# Patient Record
Sex: Female | Born: 1965 | Race: Black or African American | Hispanic: No | Marital: Single | Smoking: Never smoker
Health system: Southern US, Community
[De-identification: ages and names within clinical notes are randomized; demographics above are authoritative.]

## PROBLEM LIST (undated history)

## (undated) DIAGNOSIS — Z800 Family history of malignant neoplasm of digestive organs: Secondary | ICD-10-CM

## (undated) DIAGNOSIS — D649 Anemia, unspecified: Secondary | ICD-10-CM

## (undated) DIAGNOSIS — D259 Leiomyoma of uterus, unspecified: Secondary | ICD-10-CM

## (undated) DIAGNOSIS — E119 Type 2 diabetes mellitus without complications: Secondary | ICD-10-CM

## (undated) DIAGNOSIS — D3A8 Other benign neuroendocrine tumors: Secondary | ICD-10-CM

## (undated) DIAGNOSIS — B373 Candidiasis of vulva and vagina: Secondary | ICD-10-CM

## (undated) DIAGNOSIS — N926 Irregular menstruation, unspecified: Secondary | ICD-10-CM

## (undated) DIAGNOSIS — N63 Unspecified lump in unspecified breast: Secondary | ICD-10-CM

## (undated) DIAGNOSIS — C801 Malignant (primary) neoplasm, unspecified: Secondary | ICD-10-CM

## (undated) DIAGNOSIS — I10 Essential (primary) hypertension: Secondary | ICD-10-CM

## (undated) DIAGNOSIS — R112 Nausea with vomiting, unspecified: Secondary | ICD-10-CM

## (undated) DIAGNOSIS — R896 Abnormal cytological findings in specimens from other organs, systems and tissues: Secondary | ICD-10-CM

## (undated) DIAGNOSIS — IMO0002 Reserved for concepts with insufficient information to code with codable children: Secondary | ICD-10-CM

## (undated) DIAGNOSIS — Z9889 Other specified postprocedural states: Secondary | ICD-10-CM

## (undated) DIAGNOSIS — E785 Hyperlipidemia, unspecified: Secondary | ICD-10-CM

## (undated) DIAGNOSIS — R911 Solitary pulmonary nodule: Secondary | ICD-10-CM

## (undated) HISTORY — DX: Unspecified lump in unspecified breast: N63.0

## (undated) HISTORY — DX: Abnormal cytological findings in specimens from other organs, systems and tissues: R89.6

## (undated) HISTORY — DX: Irregular menstruation, unspecified: N92.6

## (undated) HISTORY — PX: NO PAST SURGERIES: SHX2092

## (undated) HISTORY — DX: Family history of malignant neoplasm of digestive organs: Z80.0

## (undated) HISTORY — DX: Candidiasis of vulva and vagina: B37.3

## (undated) HISTORY — DX: Reserved for concepts with insufficient information to code with codable children: IMO0002

## (undated) HISTORY — DX: Leiomyoma of uterus, unspecified: D25.9

---
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
## 1996-03-15 DIAGNOSIS — IMO0002 Reserved for concepts with insufficient information to code with codable children: Secondary | ICD-10-CM

## 1996-03-15 HISTORY — DX: Reserved for concepts with insufficient information to code with codable children: IMO0002

## 1998-11-09 ENCOUNTER — Other Ambulatory Visit: Admission: RE | Admit: 1998-11-09 | Discharge: 1998-11-09 | Payer: Self-pay | Admitting: *Deleted

## 1999-08-06 ENCOUNTER — Other Ambulatory Visit: Admission: RE | Admit: 1999-08-06 | Discharge: 1999-08-06 | Payer: Self-pay | Admitting: Obstetrics & Gynecology

## 1999-08-06 DIAGNOSIS — IMO0001 Reserved for inherently not codable concepts without codable children: Secondary | ICD-10-CM

## 1999-08-06 HISTORY — DX: Reserved for inherently not codable concepts without codable children: IMO0001

## 2000-04-03 ENCOUNTER — Other Ambulatory Visit: Admission: RE | Admit: 2000-04-03 | Discharge: 2000-04-03 | Payer: Self-pay | Admitting: Obstetrics & Gynecology

## 2000-06-10 DIAGNOSIS — N63 Unspecified lump in unspecified breast: Secondary | ICD-10-CM

## 2000-06-10 HISTORY — DX: Unspecified lump in unspecified breast: N63.0

## 2001-02-20 ENCOUNTER — Other Ambulatory Visit: Admission: RE | Admit: 2001-02-20 | Discharge: 2001-02-20 | Payer: Self-pay | Admitting: Obstetrics and Gynecology

## 2002-02-19 ENCOUNTER — Other Ambulatory Visit: Admission: RE | Admit: 2002-02-19 | Discharge: 2002-02-19 | Payer: Self-pay | Admitting: Obstetrics and Gynecology

## 2003-03-01 ENCOUNTER — Other Ambulatory Visit: Admission: RE | Admit: 2003-03-01 | Discharge: 2003-03-01 | Payer: Self-pay | Admitting: Obstetrics and Gynecology

## 2004-06-10 DIAGNOSIS — B3731 Acute candidiasis of vulva and vagina: Secondary | ICD-10-CM

## 2004-06-10 DIAGNOSIS — B373 Candidiasis of vulva and vagina: Secondary | ICD-10-CM

## 2004-06-10 HISTORY — DX: Acute candidiasis of vulva and vagina: B37.31

## 2004-06-10 HISTORY — DX: Candidiasis of vulva and vagina: B37.3

## 2004-07-03 ENCOUNTER — Other Ambulatory Visit: Admission: RE | Admit: 2004-07-03 | Discharge: 2004-07-03 | Payer: Self-pay | Admitting: Obstetrics and Gynecology

## 2005-07-12 ENCOUNTER — Other Ambulatory Visit: Admission: RE | Admit: 2005-07-12 | Discharge: 2005-07-12 | Payer: Self-pay | Admitting: Obstetrics and Gynecology

## 2008-06-10 DIAGNOSIS — D259 Leiomyoma of uterus, unspecified: Secondary | ICD-10-CM

## 2008-06-10 HISTORY — DX: Leiomyoma of uterus, unspecified: D25.9

## 2009-06-10 DIAGNOSIS — N926 Irregular menstruation, unspecified: Secondary | ICD-10-CM

## 2009-06-10 HISTORY — DX: Irregular menstruation, unspecified: N92.6

## 2012-01-06 ENCOUNTER — Ambulatory Visit: Payer: Self-pay | Admitting: Obstetrics and Gynecology

## 2012-02-06 ENCOUNTER — Ambulatory Visit (INDEPENDENT_AMBULATORY_CARE_PROVIDER_SITE_OTHER): Payer: BC Managed Care – PPO | Admitting: Obstetrics and Gynecology

## 2012-02-06 ENCOUNTER — Encounter: Payer: Self-pay | Admitting: Obstetrics and Gynecology

## 2012-02-06 VITALS — BP 120/82 | Ht 69.0 in | Wt 248.0 lb

## 2012-02-06 DIAGNOSIS — Z124 Encounter for screening for malignant neoplasm of cervix: Secondary | ICD-10-CM

## 2012-02-06 NOTE — Patient Instructions (Signed)
Perimenopause  Perimenopause is the time when your body begins to move into the menopause (no menstrual period for 12 straight months). It is a natural process. Perimenopause can begin 2 to 8 years before the menopause and usually lasts for one year after the menopause. During this time, your ovaries may or may not produce an egg. The ovaries vary in their production of estrogen and progesterone hormones each month. This can cause irregular menstrual periods, difficulty in getting pregnant, vaginal bleeding between periods and uncomfortable symptoms.  CAUSES   Irregular production of the ovarian hormones, estrogen and progesterone, and not ovulating every month.   Other causes include:   Tumor of the pituitary gland in the brain.   Medical disease that affects the ovaries.   Radiation treatment.   Chemotherapy.   Unknown causes.   Heavy smoking and excessive alcohol intake can bring on perimenopause sooner.  SYMPTOMS     Hot flashes.   Night sweats.   Irregular menstrual periods.   Decrease sex drive.   Vaginal dryness.   Headaches.   Mood swings.   Depression.   Memory problems.   Irritability.   Tiredness.   Weight gain.   Trouble getting pregnant.   The beginning of losing bone cells (osteoporosis).   The beginning of hardening of the arteries (atherosclerosis).  DIAGNOSIS    Your caregiver will make a diagnosis by analyzing your age, menstrual history and your symptoms. They will do a physical exam noting any changes in your body, especially your female organs. Female hormone tests may or may not be helpful depending on the amount and when you produce the female hormones. However, other hormone tests may be helpful (ex. thyroid hormone) to rule out other problems.  TREATMENT    The decision to treat during the perimenopause should be made by you and your caregiver depending on how the symptoms are affecting you and your life style. There are various treatments available such as:    Treating individual symptoms with a specific medication for that symptom (ex. tranquilizer for depression).   Herbal medications that can help specific symptoms.   Counseling.   Group therapy.   No treatment.  HOME CARE INSTRUCTIONS     Before seeing your caregiver, make a list of your menstrual periods (when the occur, how heavy they are, how long between periods and how long they last), your symptoms and when they started.   Take the medication as recommended by your caregiver.   Sleep and rest.   Exercise.   Eat a diet that contains calcium (good for your bones) and soy (acts like estrogen hormone).   Do not smoke.   Avoid alcoholic beverages.   Taking vitamin E may help in certain cases.   Take calcium and vitamin D supplements to help prevent bone loss.   Group therapy is sometimes helpful.   Acupuncture may help in some cases.  SEEK MEDICAL CARE IF:     You have any of the above and want to know if it is perimenopause.   You want advice and treatment for any of your symptoms mentioned above.   You need a referral to a specialist (gynecologist, psychiatrist or psychologist).  SEEK IMMEDIATE MEDICAL CARE IF:     You have vaginal bleeding.   Your period lasts longer than 8 days.   You periods are recurring sooner than 21 days.   You have bleeding after intercourse.   You have severe depression.   You have pain   when you urinate.   You have severe headaches.   You develop vision problems.  Document Released: 07/04/2004 Document Revised: 05/16/2011 Document Reviewed: 03/24/2008  [employer] Patient Information 2012 [employer]

## 2012-02-06 NOTE — Progress Notes (Signed)
Last Pap: 7/12 WNL: Yes Regular Periods:no Contraception: n/a  Monthly Breast exam:no Tetanus<23yrs:yes Nl.Bladder Function:yes Daily BMs:yes Healthy Diet:yes Calcium:no Mammogram:yes Date of Mammogram: 2012 wnl per pt Exercise:yes Have often Exercise: 6 days Seatbelt: yes Abuse at home: no Stressful work:no Sigmoid-colonoscopy: n/a Bone Density: No PCP: Dr. Allyne Gee Change in PMH: no change Change in FMH:no change BP 120/82  Ht 5\' 9"  (1.753 m)  Wt 248 lb (112.492 kg)  BMI 36.62 kg/m2  LMP 01/20/2012 Pt without complaints Physical Examination: General appearance - alert, well appearing, and in no distress Mental status - normal mood, behavior, speech, dress, motor activity, and thought processes Neck - supple, no significant adenopathy, thyroid exam: thyroid is normal in size without nodules or tenderness Chest - clear to auscultation, no wheezes, rales or rhonchi, symmetric air entry Heart - normal rate and regular rhythm Abdomen - soft, nontender, nondistended, no masses or organomegaly Breasts - breasts appear normal, no suspicious masses, no skin or nipple changes or axillary nodes Pelvic - normal external genitalia, vulva, vagina, cervix, uterus and adnexa Rectal - normal rectal, no masses, rectal exam not indicated Back exam - full range of motion, no tenderness, palpable spasm or pain on motion Neurological - alert, oriented, normal speech, no focal findings or movement disorder noted Musculoskeletal - no joint tenderness, deformity or swelling Extremities - no edema, redness or tenderness in the calves or thighs Skin - normal coloration and turgor, no rashes, no suspicious skin lesions noted Routine exam Pap sent yes Mammogram due yes scheduled by the pt pt declines contraception RT 1 yr

## 2012-02-07 LAB — PAP IG W/ RFLX HPV ASCU

## 2012-12-10 ENCOUNTER — Other Ambulatory Visit: Payer: Self-pay | Admitting: Endocrinology

## 2012-12-10 DIAGNOSIS — E7800 Pure hypercholesterolemia, unspecified: Secondary | ICD-10-CM

## 2012-12-15 ENCOUNTER — Other Ambulatory Visit (INDEPENDENT_AMBULATORY_CARE_PROVIDER_SITE_OTHER): Payer: BC Managed Care – PPO

## 2012-12-15 DIAGNOSIS — E7800 Pure hypercholesterolemia, unspecified: Secondary | ICD-10-CM

## 2012-12-15 LAB — URINALYSIS
Leukocytes, UA: NEGATIVE
Nitrite: NEGATIVE
Specific Gravity, Urine: <=1.005 (ref 1.000–1.030)
Total Protein, Urine: NEGATIVE
pH: 6.5 (ref 5.0–8.0)

## 2012-12-15 LAB — COMPREHENSIVE METABOLIC PANEL
Alkaline Phosphatase: 45 U/L (ref 39–117)
BUN: 13 mg/dL (ref 6–23)
Creatinine, Ser: 0.8 mg/dL (ref 0.4–1.2)
GFR: 98.96 mL/min (ref >60.00)
Glucose, Bld: 92 mg/dL (ref 70–99)
Sodium: 138 mEq/L (ref 135–145)
Total Bilirubin: 0.5 mg/dL (ref 0.3–1.2)
Total Protein: 8 g/dL (ref 6.0–8.3)

## 2012-12-15 LAB — MICROALBUMIN / CREATININE URINE RATIO
Creatinine,U: 26.4 mg/dL
Microalb Creat Ratio: 0.8 mg/g (ref 0.0–30.0)
Microalb, Ur: 0.2 mg/dL (ref 0.0–1.9)

## 2012-12-17 ENCOUNTER — Ambulatory Visit (INDEPENDENT_AMBULATORY_CARE_PROVIDER_SITE_OTHER): Payer: BC Managed Care – PPO | Admitting: Endocrinology

## 2012-12-17 ENCOUNTER — Encounter: Payer: Self-pay | Admitting: Endocrinology

## 2012-12-17 VITALS — BP 122/80 | HR 69 | Ht 69.0 in | Wt 248.2 lb

## 2012-12-17 DIAGNOSIS — E1165 Type 2 diabetes mellitus with hyperglycemia: Secondary | ICD-10-CM | POA: Insufficient documentation

## 2012-12-17 DIAGNOSIS — IMO0001 Reserved for inherently not codable concepts without codable children: Secondary | ICD-10-CM

## 2012-12-17 DIAGNOSIS — E7800 Pure hypercholesterolemia, unspecified: Secondary | ICD-10-CM

## 2012-12-17 DIAGNOSIS — I10 Essential (primary) hypertension: Secondary | ICD-10-CM

## 2012-12-17 NOTE — Progress Notes (Signed)
Patient ID: Alyssa Ross, female   DOB: 1965-11-19, 47 y.o.   MRN: [id number] Reason for Appointment: Diabetes follow-up   History of Present Illness   Diagnosis: Type 2 DIABETES MELITUS,   Oral hypoglycemic drugs: Amaryl and metformin        Side effects from medications: None Proper timing of medications in relation to meals: Yes.         Monitors blood glucose: Once a day.    Glucometer: One Touch.          Blood Glucose readings: Glucometer download reviewed: readings before breakfast: Median 147, range 104-182. Nonfasting 89-213 with only sporadic readings during the day and only 1 reading in the afternoon and one in the evening. Overall a median 147   Hypoglycemia frequency: Never.          Meals: 3 meals per day.       DIET: She is usually able to control portions usually but has been unable to control higher fat foods and sometimes carbohydrates also  Physical activity: exercise: Going up to 5 days a week for 45 minutes            Her blood sugars are still not optimally controlled with A1c over 7%. She thinks that most of her high sugars are related to dietary indiscretions and she cannot be consistent with diet despite taking Victoza. She has been doing Victoza for sometime. Now she is trying to use Amaryl based on what she is eating and will take 2 mg eating larger meals. Last night with eating a light meal she had a blood sugar of 89 but felt hypoglycemic. She has difficulty losing weight despite exercising  HYPERTENSION:  Has been present for several years.  The blood pressure control is excellent  HYPERLIPIDEMIA:         The lipid abnormality consists of elevated LDL treated with Crestor .    Appointment on 12/15/2012  Component Date Value Range Status  . Hemoglobin A1C 12/15/2012 7.4* 4.6 - 6.5 % Final   Glycemic Control Guidelines for People with Diabetes:Non Diabetic:  <6%Goal of Therapy: <7%Additional Action Suggested:  >8%   . Sodium 12/15/2012 138  135 - 145 mEq/L Final   . Potassium 12/15/2012 3.6  3.5 - 5.1 mEq/L Final  . Chloride 12/15/2012 100  96 - 112 mEq/L Final  . CO2 12/15/2012 29  19 - 32 mEq/L Final  . Glucose, Bld 12/15/2012 92  70 - 99 mg/dL Final  . BUN 40/98/1191 13  6 - 23 mg/dL Final  . Creatinine, Ser 12/15/2012 0.8  0.4 - 1.2 mg/dL Final  . Total Bilirubin 12/15/2012 0.5  0.3 - 1.2 mg/dL Final  . Alkaline Phosphatase 12/15/2012 45  39 - 117 U/L Final  . AST 12/15/2012 14  0 - 37 U/L Final  . ALT 12/15/2012 14  0 - 35 U/L Final  . Total Protein 12/15/2012 8.0  6.0 - 8.3 g/dL Final  . Albumin 47/82/9562 4.2  3.5 - 5.2 g/dL Final  . Calcium 13/01/6577 9.9  8.4 - 10.5 mg/dL Final  . GFR 46/96/2952 98.96  >60.00 mL/min Final  . Color, Urine 12/15/2012 LT. YELLOW  Yellow;Lt. Yellow Final  . APPearance 12/15/2012 CLEAR  Clear Final  . Specific Gravity, Urine 12/15/2012 <=1.005  1.000 - 1.030 Final  . pH 12/15/2012 6.5  5.0 - 8.0 Final  . Total Protein, Urine 12/15/2012 NEGATIVE  Negative Final  . Urine Glucose 12/15/2012 NEGATIVE  Negative Final  . Ketones,  ur 12/15/2012 NEGATIVE  Negative Final  . Bilirubin Urine 12/15/2012 NEGATIVE  Negative Final  . Hgb urine dipstick 12/15/2012 NEGATIVE  Negative Final  . Urobilinogen, UA 12/15/2012 0.2  0.0 - 1.0 Final  . Leukocytes, UA 12/15/2012 NEGATIVE  Negative Final  . Nitrite 12/15/2012 NEGATIVE  Negative Final  . Microalb, Ur 12/15/2012 0.2  0.0 - 1.9 mg/dL Final  . Creatinine,U 60/45/4098 26.4   Final  . Microalb Creat Ratio 12/15/2012 0.8  0.0 - 30.0 mg/g Final      Medication List       This list is accurate as of: 12/17/12  2:52 PM.  Always use your most recent med list.               AMARYL 1 MG tablet  Generic drug:  glimepiride  Take 1 mg by mouth daily before breakfast.     B-D ULTRAFINE III SHORT PEN 31G X 8 MM Misc  Generic drug:  Insulin Pen Needle     lisinopril-hydrochlorothiazide 10-12.5 MG per tablet  Commonly known as:  PRINZIDE,ZESTORETIC  Take 1 tablet by  mouth daily.     metFORMIN 750 MG 24 hr tablet  Commonly known as:  GLUCOPHAGE-XR  Take 750 mg by mouth daily with breakfast.     rosuvastatin 10 MG tablet  Commonly known as:  CRESTOR  Take 10 mg by mouth daily.     VICTOZA 18 MG/3ML Soln injection  Generic drug:  Liraglutide  Inject into the skin.        Allergies: No Known Allergies  Past Medical History  Diagnosis Date  . 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 ASCUS (atypical squamous cells of undetermined significance) on Pap smear 08/06/1999  . Breast mass in female 2002    Left  . Yeast vaginitis 2006  . Fibroid uterus 2010  . Irregular bleeding 2011    No past surgical history on file.  Family History  Problem Relation Age of Onset  . Diabetes Mother   . Diabetes Father   . Mental retardation Sister     Social History:  reports that she has never smoked. She has never used smokeless tobacco. She reports that  drinks alcohol. She reports that she does not use illicit drugs.    Examination:   BP 122/80  Pulse 69  Ht 5\' 9"  (1.753 m)  Wt 248 lb 3.2 oz (112.583 kg)  BMI 36.64 kg/m2  SpO2 96%  LMP 09/17/2012  Body mass index is 36.64 kg/(m^2).   No edema present  Assesment/plan:   1. Diabetes type 2, uncontrolled - 250.02  The patient's diabetes control appears to be limited by her dietary habits. She has excellent control when she follows her diet but blood sugars are higher especially overnight when she is not watching the types of foods she needs to eat. She thinks her portion control is fairly good but will probably too many carbohydrates and also sometimes have regular soft drinks. She is also taking her Amaryl as needed for her food intake. She wants to try a dietary program in [place] before seeing the nutrition specialists here and she will call. However she is still refusing to consider Invokana which may be helpful especially with her difficulty with weight loss.  She does exercise and can continue to be regular with this. Meanwhile can continue taking variable doses of Amaryl based on her intake and also Victoza.  2. Hypertension: Well controlled    Alyannah Sanks 12/17/2012, 2:52  PM

## 2012-12-17 NOTE — Patient Instructions (Signed)
Check more sugars after supper  Stop regular sodas and cut down carbs

## 2012-12-22 DIAGNOSIS — E7800 Pure hypercholesterolemia, unspecified: Secondary | ICD-10-CM | POA: Insufficient documentation

## 2012-12-22 DIAGNOSIS — I10 Essential (primary) hypertension: Secondary | ICD-10-CM | POA: Insufficient documentation

## 2013-02-02 ENCOUNTER — Other Ambulatory Visit: Payer: Self-pay | Admitting: Endocrinology

## 2013-03-12 ENCOUNTER — Other Ambulatory Visit: Payer: BC Managed Care – PPO

## 2013-03-12 ENCOUNTER — Other Ambulatory Visit: Payer: Self-pay | Admitting: Endocrinology

## 2013-03-17 ENCOUNTER — Other Ambulatory Visit: Payer: BC Managed Care – PPO

## 2013-03-19 ENCOUNTER — Ambulatory Visit: Payer: BC Managed Care – PPO | Admitting: Endocrinology

## 2013-03-29 ENCOUNTER — Other Ambulatory Visit: Payer: BC Managed Care – PPO

## 2013-03-29 DIAGNOSIS — E7800 Pure hypercholesterolemia, unspecified: Secondary | ICD-10-CM

## 2013-03-29 DIAGNOSIS — IMO0001 Reserved for inherently not codable concepts without codable children: Secondary | ICD-10-CM

## 2013-03-29 LAB — HEMOGLOBIN A1C: Hgb A1c MFr Bld: 7.8 % — ABNORMAL HIGH (ref 4.6–6.5)

## 2013-03-29 LAB — COMPREHENSIVE METABOLIC PANEL WITH GFR
ALT: 20 U/L (ref 0–35)
AST: 19 U/L (ref 0–37)
Albumin: 3.9 g/dL (ref 3.5–5.2)
Alkaline Phosphatase: 49 U/L (ref 39–117)
BUN: 14 mg/dL (ref 6–23)
CO2: 30 meq/L (ref 19–32)
Calcium: 9.7 mg/dL (ref 8.4–10.5)
Chloride: 102 meq/L (ref 96–112)
Creatinine, Ser: 0.9 mg/dL (ref 0.4–1.2)
GFR: 92.16 mL/min
Glucose, Bld: 150 mg/dL — ABNORMAL HIGH (ref 70–99)
Potassium: 3.8 meq/L (ref 3.5–5.1)
Sodium: 140 meq/L (ref 135–145)
Total Bilirubin: 0.6 mg/dL (ref 0.3–1.2)
Total Protein: 7.8 g/dL (ref 6.0–8.3)

## 2013-03-29 LAB — LIPID PANEL
Cholesterol: 167 mg/dL (ref 0–200)
HDL: 54.7 mg/dL
LDL Cholesterol: 88 mg/dL (ref 0–99)
Total CHOL/HDL Ratio: 3
Triglycerides: 121 mg/dL (ref 0.0–149.0)
VLDL: 24.2 mg/dL (ref 0.0–40.0)

## 2013-03-29 LAB — MICROALBUMIN / CREATININE URINE RATIO
Creatinine,U: 35 mg/dL
Microalb, Ur: 0.5 mg/dL (ref 0.0–1.9)

## 2013-04-02 ENCOUNTER — Ambulatory Visit (INDEPENDENT_AMBULATORY_CARE_PROVIDER_SITE_OTHER): Payer: BC Managed Care – PPO | Admitting: Endocrinology

## 2013-04-02 ENCOUNTER — Encounter: Payer: Self-pay | Admitting: Endocrinology

## 2013-04-02 VITALS — BP 120/80 | HR 71 | Temp 98.0°F | Ht 69.0 in | Wt 244.4 lb

## 2013-04-02 DIAGNOSIS — E7800 Pure hypercholesterolemia, unspecified: Secondary | ICD-10-CM

## 2013-04-02 DIAGNOSIS — IMO0001 Reserved for inherently not codable concepts without codable children: Secondary | ICD-10-CM

## 2013-04-02 DIAGNOSIS — I10 Essential (primary) hypertension: Secondary | ICD-10-CM

## 2013-04-02 DIAGNOSIS — Z23 Encounter for immunization: Secondary | ICD-10-CM

## 2013-04-02 NOTE — Patient Instructions (Signed)
May reduce am Glimeperide to 1/2 with diet

## 2013-04-02 NOTE — Progress Notes (Signed)
Patient ID: Alyssa Ross, female   DOB: Feb 07, 1966, 47 y.o.   MRN: [id number]  Reason for Appointment: Diabetes follow-up   History of Present Illness   Diagnosis: Type 2 DIABETES MELITUS   Her blood sugars are still not optimally controlled with A1c slightly higher. She thinks that most of her high sugars are related to inconsistent adherence to her diet. She has difficulty with choices of foods rather than portions. She has been doing Victoza for sometime. Also she is taking are consistent dose of Amaryl now She tends to have relatively high readings in the mornings and not as high after supper but did not bring the monitor for review She has difficulty losing weight despite exercising  Oral hypoglycemic drugs: Amaryl and metformin        Side effects from medications: None Proper timing of medications in relation to meals: Yes.         Monitors blood glucose: ireg most in ams.    Glucometer: One Touch.          Blood Glucose readings by recall: readings before breakfast: AM 160-170; HS 120-130   Hypoglycemia frequency: Never.          Meals: 3 meals per day.       DIET: She is usually able to control portions usually but has been unable to control higher fat foods and sometimes carbohydrates also  Physical activity: exercise: Going up to 5 days a week for 45 minutes             HYPERTENSION:  Has been present for several years.  The blood pressure control is good with her Zestoretic  HYPERLIPIDEMIA:         The lipid abnormality consists of elevated LDL treated with Crestor. LDL 88.  LABS:  Lab Results  Component Value Date   HGBA1C 7.8* 03/29/2013   HGBA1C 7.4* 12/15/2012   Lab Results  Component Value Date   MICROALBUR 0.5 03/29/2013   LDLCALC 88 03/29/2013   CREATININE 0.9 03/29/2013    Appointment on 03/29/2013  Component Date Value Range Status  . Microalb, Ur 03/29/2013 0.5  0.0 - 1.9 mg/dL Final  . Creatinine,U 08/65/7846 35.0   Final  . Microalb Creat Ratio  03/29/2013 1.4  0.0 - 30.0 mg/g Final  . Sodium 03/29/2013 140  135 - 145 mEq/L Final  . Potassium 03/29/2013 3.8  3.5 - 5.1 mEq/L Final  . Chloride 03/29/2013 102  96 - 112 mEq/L Final  . CO2 03/29/2013 30  19 - 32 mEq/L Final  . Glucose, Bld 03/29/2013 150* 70 - 99 mg/dL Final  . BUN 96/29/5284 14  6 - 23 mg/dL Final  . Creatinine, Ser 03/29/2013 0.9  0.4 - 1.2 mg/dL Final  . Total Bilirubin 03/29/2013 0.6  0.3 - 1.2 mg/dL Final  . Alkaline Phosphatase 03/29/2013 49  39 - 117 U/L Final  . AST 03/29/2013 19  0 - 37 U/L Final  . ALT 03/29/2013 20  0 - 35 U/L Final  . Total Protein 03/29/2013 7.8  6.0 - 8.3 g/dL Final  . Albumin 13/24/4010 3.9  3.5 - 5.2 g/dL Final  . Calcium 27/25/3664 9.7  8.4 - 10.5 mg/dL Final  . GFR 40/34/7425 92.16  >60.00 mL/min Final  . Cholesterol 03/29/2013 167  0 - 200 mg/dL Final   ATP III Classification       Desirable:  < 200 mg/dL  Borderline High:  200 - 239 mg/dL          High:  > = 161 mg/dL  . Triglycerides 03/29/2013 121.0  0.0 - 149.0 mg/dL Final   Normal:  <096 mg/dLBorderline High:  150 - 199 mg/dL  . HDL 03/29/2013 54.70  >39.00 mg/dL Final  . VLDL 04/54/0981 24.2  0.0 - 40.0 mg/dL Final  . LDL Cholesterol 03/29/2013 88  0 - 99 mg/dL Final  . Total CHOL/HDL Ratio 03/29/2013 3   Final                  Men          Women1/2 Average Risk     3.4          3.3Average Risk          5.0          4.42X Average Risk          9.6          7.13X Average Risk          15.0          11.0                      . Hemoglobin A1C 03/29/2013 7.8* 4.6 - 6.5 % Final   Glycemic Control Guidelines for People with Diabetes:Non Diabetic:  <6%Goal of Therapy: <7%Additional Action Suggested:  >8%       Medication List       This list is accurate as of: 04/02/13  1:52 PM.  Always use your most recent med list.               B-D ULTRAFINE III SHORT PEN 31G X 8 MM Misc  Generic drug:  Insulin Pen Needle     glimepiride 2 MG tablet  Commonly known as:   AMARYL  Take 2 mg by mouth 2 (two) times daily.     lisinopril-hydrochlorothiazide 10-12.5 MG per tablet  Commonly known as:  PRINZIDE,ZESTORETIC  Take 1 tablet by mouth daily.     metFORMIN 750 MG 24 hr tablet  Commonly known as:  GLUCOPHAGE-XR  Take 750 mg by mouth 3 (three) times daily.     rosuvastatin 10 MG tablet  Commonly known as:  CRESTOR  Take 10 mg by mouth daily.     VICTOZA 18 MG/3ML Sopn  Generic drug:  Liraglutide  INJECT 1.8 MG SUBCUTANEOUSLY DAILY     VICTOZA 18 MG/3ML Soln injection  Generic drug:  Liraglutide  Inject into the skin.        Allergies: No Known Allergies  Past Medical History  Diagnosis Date  . 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 ASCUS (atypical squamous cells of undetermined significance) on Pap smear 08/06/1999  . Breast mass in female 2002    Left  . Yeast vaginitis 2006  . Fibroid uterus 2010  . Irregular bleeding 2011    History reviewed. No pertinent past surgical history.  Family History  Problem Relation Age of Onset  . Diabetes Mother   . Diabetes Father   . Mental retardation Sister     Social History:  reports that she has never smoked. She has never used smokeless tobacco. She reports that she drinks alcohol. She reports that she does not use illicit drugs.    Examination:   BP 120/80  Pulse 71  Temp(Src) 98 F (36.7 C) (Oral)  Ht 5\' 9"  (1.753 m)  Wt 244 lb 6.4 oz (110.859 kg)  BMI 36.08 kg/m2  SpO2 97%  Body mass index is 36.08 kg/(m^2).    Assessment/Plan:   1. Diabetes type 2, uncontrolled - 250.02  The patient's diabetes control appears slightly worse with A1c trending higher Appears to have relatively high fasting readings and not reportedly high after supper This may be partly related to higher fat intake She is exercising but has difficulty losing weight Has been reluctant to take Invokana because of potential yeast infections  She is going to be started on a  vigorous nutritional program at [facility] and she thinks she will be able to improve control and lose weight with this. She will be monitored closely on this program Have discussed that she may be able to reduce her Amaryl dose of her blood sugars are trending lower especially with weight loss and reduce carbohydrate intake  2. Hypertension: Well controlled   Kahlan Engebretson 04/02/2013, 1:52 PM

## 2013-05-05 ENCOUNTER — Other Ambulatory Visit: Payer: Self-pay | Admitting: Endocrinology

## 2013-07-06 ENCOUNTER — Other Ambulatory Visit (INDEPENDENT_AMBULATORY_CARE_PROVIDER_SITE_OTHER): Payer: BC Managed Care – PPO

## 2013-07-06 DIAGNOSIS — IMO0001 Reserved for inherently not codable concepts without codable children: Secondary | ICD-10-CM

## 2013-07-06 DIAGNOSIS — E1165 Type 2 diabetes mellitus with hyperglycemia: Principal | ICD-10-CM

## 2013-07-06 LAB — COMPREHENSIVE METABOLIC PANEL
ALBUMIN: 4.1 g/dL (ref 3.5–5.2)
ALT: 28 U/L (ref 0–35)
AST: 18 U/L (ref 0–37)
Alkaline Phosphatase: 51 U/L (ref 39–117)
BUN: 17 mg/dL (ref 6–23)
CALCIUM: 9.9 mg/dL (ref 8.4–10.5)
CO2: 31 mEq/L (ref 19–32)
CREATININE: 1 mg/dL (ref 0.4–1.2)
Chloride: 100 mEq/L (ref 96–112)
GFR: 80.96 mL/min (ref >60.00)
GLUCOSE: 136 mg/dL — AB (ref 70–99)
POTASSIUM: 3.4 meq/L — AB (ref 3.5–5.1)
Sodium: 137 mEq/L (ref 135–145)
Total Bilirubin: 0.7 mg/dL (ref 0.3–1.2)
Total Protein: 8 g/dL (ref 6.0–8.3)

## 2013-07-06 LAB — HEMOGLOBIN A1C: Hgb A1c MFr Bld: 8.5 % — ABNORMAL HIGH (ref 4.6–6.5)

## 2013-07-09 ENCOUNTER — Ambulatory Visit (INDEPENDENT_AMBULATORY_CARE_PROVIDER_SITE_OTHER): Payer: BC Managed Care – PPO | Admitting: Endocrinology

## 2013-07-09 ENCOUNTER — Other Ambulatory Visit: Payer: Self-pay | Admitting: *Deleted

## 2013-07-09 ENCOUNTER — Encounter: Payer: Self-pay | Admitting: Endocrinology

## 2013-07-09 VITALS — BP 132/84 | HR 81 | Temp 98.2°F | Resp 16 | Ht 69.0 in | Wt 240.8 lb

## 2013-07-09 DIAGNOSIS — I10 Essential (primary) hypertension: Secondary | ICD-10-CM

## 2013-07-09 DIAGNOSIS — E1165 Type 2 diabetes mellitus with hyperglycemia: Principal | ICD-10-CM

## 2013-07-09 DIAGNOSIS — IMO0001 Reserved for inherently not codable concepts without codable children: Secondary | ICD-10-CM

## 2013-07-09 MED ORDER — POTASSIUM CHLORIDE CRYS ER 20 MEQ PO TBCR: 20.0000 meq | EXTENDED_RELEASE_TABLET | Freq: Every day | ORAL | Status: DC

## 2013-07-09 MED ORDER — INSULIN DETEMIR 100 UNIT/ML FLEXPEN
15.0000 [IU] | PEN_INJECTOR | Freq: Every day | SUBCUTANEOUS | Status: DC
Start: 2013-07-09 — End: 2013-08-04

## 2013-07-09 NOTE — Progress Notes (Signed)
Patient ID: Alyssa Ross, female   DOB: September 30, 1965, 48 y.o.   MRN: [id number]   Reason for Appointment: Diabetes follow-up   History of Present Illness   Diagnosis: Type 2 DIABETES MELITUS, date of diagnosis 2005   She had previously been on metformin and Amaryl Because of inadequate control she was given Victoza in addition in 2011 With this her blood sugars have been significantly better and her A1c has been as low as 6.2 in 2013 Her blood sugars have been more difficult to control since 2014 an A1c consistently over 7% RECENT history: Her A1c has increased further this time On her last visit she was hoping to get much better controlled by joining a nutritional support program at [facility] She is currently enrolled but is not able to watch her diet consistently. She has difficulty with choices of foods rather than portions. She is on 1.8 mg  of Victoza as well as metformin. Also she is taking maximum dose of Amaryl now Hyperglycemia: This appears to be mostly fasting and has only occasional high readings at night She has been losing weight and is still compliant with her exercising routine  Oral hypoglycemic drugs: Amaryl 4mg  bid and metformin 2250 mg       Side effects from medications: None Proper timing of medications in relation to meals: Yes.         Monitors blood glucose: Once a day   Glucometer: One Touch.          Blood Glucose readings  PREMEAL Breakfast Lunch Dinner Bedtime Overall  Glucose range:  160-240   187-260   92, 162     Mean/median:  196      190    POST-MEAL PC Breakfast PC Lunch PC Dinner  Glucose range:   182, 259   150-215   Mean/median:      Hypoglycemia: None      Meals: 3 meals per day.       DIET: She is usually able to control portions usually but has been unable to control higher fat foods and sometimes carbohydrates also  Physical activity: exercise: Going up to 5 days a week for 45 minutes            Wt Readings from Last 3 Encounters:   07/09/13 240 lb 12.8 oz (109.226 kg)  04/02/13 244 lb 6.4 oz (110.859 kg)  12/17/12 248 lb 3.2 oz (112.583 kg)    HYPERTENSION:  Has been present for several years.  The blood pressure is controlled with  Zestoretic  HYPERLIPIDEMIA:         The lipid abnormality consists of elevated LDL treated with Crestor. Last LDL 88.  LABS:  Lab Results  Component Value Date   HGBA1C 8.5* 07/06/2013   HGBA1C 7.8* 03/29/2013   HGBA1C 7.4* 12/15/2012   Lab Results  Component Value Date   MICROALBUR 0.5 03/29/2013   LDLCALC 88 03/29/2013   CREATININE 1.0 07/06/2013    Appointment on 07/06/2013  Component Date Value Range Status  . Hemoglobin A1C 07/06/2013 8.5* 4.6 - 6.5 % Final   Glycemic Control Guidelines for People with Diabetes:Non Diabetic:  <6%Goal of Therapy: <7%Additional Action Suggested:  >8%   . Sodium 07/06/2013 137  135 - 145 mEq/L Final  . Potassium 07/06/2013 3.4* 3.5 - 5.1 mEq/L Final  . Chloride 07/06/2013 100  96 - 112 mEq/L Final  . CO2 07/06/2013 31  19 - 32 mEq/L Final  . Glucose, Bld 07/06/2013 136* 70 -  99 mg/dL Final  . BUN 07/06/2013 17  6 - 23 mg/dL Final  . Creatinine, Ser 07/06/2013 1.0  0.4 - 1.2 mg/dL Final  . Total Bilirubin 07/06/2013 0.7  0.3 - 1.2 mg/dL Final  . Alkaline Phosphatase 07/06/2013 51  39 - 117 U/L Final  . AST 07/06/2013 18  0 - 37 U/L Final  . ALT 07/06/2013 28  0 - 35 U/L Final  . Total Protein 07/06/2013 8.0  6.0 - 8.3 g/dL Final  . Albumin 07/06/2013 4.1  3.5 - 5.2 g/dL Final  . Calcium 07/06/2013 9.9  8.4 - 10.5 mg/dL Final  . GFR 07/06/2013 80.96  >60.00 mL/min Final      Medication List       This list is accurate as of: 07/09/13  1:46 PM.  Always use your most recent med list.               B-D ULTRAFINE III SHORT PEN 31G X 8 MM Misc  Generic drug:  Insulin Pen Needle  USE WITH VICTOZA PEN AS DIRECTED     glimepiride 2 MG tablet  Commonly known as:  AMARYL  Take 2 mg by mouth 2 (two) times daily.      lisinopril-hydrochlorothiazide 10-12.5 MG per tablet  Commonly known as:  PRINZIDE,ZESTORETIC  Take 1 tablet by mouth daily.     metFORMIN 750 MG 24 hr tablet  Commonly known as:  GLUCOPHAGE-XR  Take 750 mg by mouth 3 (three) times daily.     rosuvastatin 10 MG tablet  Commonly known as:  CRESTOR  Take 10 mg by mouth daily.     VICTOZA 18 MG/3ML Sopn  Generic drug:  Liraglutide  INJECT 1.8 MG SUBCUTANEOUSLY DAILY        Allergies: No Known Allergies  Past Medical History  Diagnosis Date  . 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 ASCUS (atypical squamous cells of undetermined significance) on Pap smear 08/06/1999  . Breast mass in female 2002    Left  . Yeast vaginitis 2006  . Fibroid uterus 2010  . Irregular bleeding 2011    No past surgical history on file.  Family History  Problem Relation Age of Onset  . Diabetes Mother   . Diabetes Father   . Mental retardation Sister     Social History:  reports that she has never smoked. She has never used smokeless tobacco. She reports that she drinks alcohol. She reports that she does not use illicit drugs.    Examination:   BP 132/84  Pulse 81  Temp(Src) 98.2 F (36.8 C)  Resp 16  Ht 5\' 9"  (1.753 m)  Wt 240 lb 12.8 oz (109.226 kg)  BMI 35.54 kg/m2  SpO2 96%  Body mass index is 35.54 kg/(m^2).    Assessment/Plan:   Diabetes type 2, uncontrolled  The patient's diabetes control appears  worse with A1c trending  progressively higher Appears to have relatively high fasting readings and sporadically high during the day although checking mostly in the morning This may be partly related to higher fat intake She is exercising but has difficulty watching her diet consistently Has been reluctant to take Invokana because of potential yeast infections, is having some candidiasis now   Discussed with her that since she has failed 3 drugs including maximum dose metformin, Amaryl and Victoza she is  likely to be insulin deficient and should start at least basal insulin She has many questions about insulin and discussed various aspects  of insulin treatment including time action profile, timing of insulin, dosage adjustment, use of the insulin pen and expected improvement in glucose readings She will start with a low dose of 8 units at bedtime of Levemir with the flex pen and increase the dose every 3 days until fasting readings are below 130. Literature on insulin and Levemir given  Encouraged her to watch her diet better   Counseling time over 50% of today's 25 minute visit   Jonovan Boedecker 07/09/2013, 1:46 PM

## 2013-07-09 NOTE — Patient Instructions (Signed)
Levemir insulin: This insulin provides blood sugar control for up to 24 hours.  Start with 8 units at bedtime daily and increase by 2 units every 3 days until the waking up sugars are under 130. Then continue the same dose. If blood sugar is under 90 for 2 days in a row, reduce the dose by 2 units. Note that this insulin does not control the rise of blood sugar with meals

## 2013-07-27 ENCOUNTER — Other Ambulatory Visit: Payer: Self-pay | Admitting: Endocrinology

## 2013-07-30 ENCOUNTER — Other Ambulatory Visit (INDEPENDENT_AMBULATORY_CARE_PROVIDER_SITE_OTHER): Payer: BC Managed Care – PPO

## 2013-07-30 DIAGNOSIS — E1165 Type 2 diabetes mellitus with hyperglycemia: Principal | ICD-10-CM

## 2013-07-30 DIAGNOSIS — IMO0001 Reserved for inherently not codable concepts without codable children: Secondary | ICD-10-CM

## 2013-07-30 LAB — BASIC METABOLIC PANEL
BUN: 16 mg/dL (ref 6–23)
CHLORIDE: 98 meq/L (ref 96–112)
CO2: 30 mEq/L (ref 19–32)
Calcium: 10.1 mg/dL (ref 8.4–10.5)
Creatinine, Ser: 0.8 mg/dL (ref 0.4–1.2)
GFR: 98.7 mL/min (ref >60.00)
Glucose, Bld: 100 mg/dL — ABNORMAL HIGH (ref 70–99)
POTASSIUM: 3.4 meq/L — AB (ref 3.5–5.1)
SODIUM: 136 meq/L (ref 135–145)

## 2013-07-31 LAB — FRUCTOSAMINE: FRUCTOSAMINE: 287 umol/L — AB (ref <285)

## 2013-08-04 ENCOUNTER — Ambulatory Visit (INDEPENDENT_AMBULATORY_CARE_PROVIDER_SITE_OTHER): Payer: BC Managed Care – PPO | Admitting: Endocrinology

## 2013-08-04 ENCOUNTER — Encounter: Payer: Self-pay | Admitting: Endocrinology

## 2013-08-04 VITALS — BP 122/80 | HR 85 | Temp 97.6°F | Resp 16 | Ht 69.0 in | Wt 240.0 lb

## 2013-08-04 DIAGNOSIS — E7800 Pure hypercholesterolemia, unspecified: Secondary | ICD-10-CM

## 2013-08-04 DIAGNOSIS — E1165 Type 2 diabetes mellitus with hyperglycemia: Principal | ICD-10-CM

## 2013-08-04 DIAGNOSIS — E876 Hypokalemia: Secondary | ICD-10-CM

## 2013-08-04 DIAGNOSIS — IMO0001 Reserved for inherently not codable concepts without codable children: Secondary | ICD-10-CM

## 2013-08-04 DIAGNOSIS — I10 Essential (primary) hypertension: Secondary | ICD-10-CM

## 2013-08-04 NOTE — Patient Instructions (Addendum)
Levemir 18 units daily  Please check blood sugars at least half the time about 2 hours after any meal and as directed on waking up. Please bring blood sugar monitor to each visit  Potassium 2 daily

## 2013-08-04 NOTE — Progress Notes (Signed)
Patient ID: Alyssa Ross, female   DOB: [birth year], 48 y.o.   MRN: [id number]   Reason for Appointment: Diabetes follow-up   History of Present Illness   Diagnosis: Type 2 DIABETES MELITUS, date of diagnosis 2005   She had previously been on metformin and Amaryl Because of inadequate control she was given Victoza in addition in 2011 With this her blood sugars have been significantly better and her A1c has been as low as 6.2 in 2013 Her blood sugars have been more difficult to control since 2014 an A1c consistently over 7%  RECENT history:  She was started on insulin with Levemir 8 units in 06/2013 because of further increase in her A1c to 8.5% She had previously been taking Victoza along with Amaryl and metformin without consistent controlled Also has had difficulty watching her diet especially with portions and snacks like ice cream She is injecting the insulin in her thighs and has done well with gradually increasing the dose, now taking 16 units in the last few days Her weight has been stable Fructosamine is near normal at 287 now Hyperglycemia: This had been mostly in the fasting state but improved with insulin, has hardly checked her sugar after meals as she forgets She has been losing weight and is still compliant with her exercising routine  Oral hypoglycemic drugs: Amaryl 4mg  bid and metformin 2250 mg       Side effects from medications: None Proper timing of medications in relation to meals: Yes.         Monitors blood glucose: Once a day   Glucometer: One Touch.          Blood Glucose readings in the last 2 weeks:  PREMEAL Breakfast Lunch  PMs  Bedtime Overall  Glucose range:  125-169   108   186, 193  ?    Mean/median:        Median glucose for the last month is 175 Hypoglycemia: None      Meals: 3 meals per day.       DIET: She is usually able to control portions usually but has been unable to control higher fat foods and sweets Physical activity: exercise: Going up to 5  days a week for 45 minutes, less recently            Wt Readings from Last 3 Encounters:  08/04/13 240 lb (108.863 kg)  07/09/13 240 lb 12.8 oz (109.226 kg)  04/02/13 244 lb 6.4 oz (110.859 kg)    LABS:  Lab Results  Component Value Date   HGBA1C 8.5* 07/06/2013   HGBA1C 7.8* 03/29/2013   HGBA1C 7.4* 12/15/2012   Lab Results  Component Value Date   MICROALBUR 0.5 03/29/2013   LDLCALC 88 03/29/2013   CREATININE 0.8 07/30/2013    Appointment on 07/30/2013  Component Date Value Ref Range Status  . Sodium 07/30/2013 136  135 - 145 mEq/L Final  . Potassium 07/30/2013 3.4* 3.5 - 5.1 mEq/L Final  . Chloride 07/30/2013 98  96 - 112 mEq/L Final  . CO2 07/30/2013 30  19 - 32 mEq/L Final  . Glucose, Bld 07/30/2013 100* 70 - 99 mg/dL Final  . BUN 07/30/2013 16  6 - 23 mg/dL Final  . Creatinine, Ser 07/30/2013 0.8  0.4 - 1.2 mg/dL Final  . Calcium 07/30/2013 10.1  8.4 - 10.5 mg/dL Final  . GFR 07/30/2013 98.70  >60.00 mL/min Final  . Fructosamine 07/30/2013 287* <285 umol/L Final   Comment:  Variations in levels of serum proteins (albumin and immunoglobulins)                          may affect fructosamine results.                                 Medication List       This list is accurate as of: 08/04/13  1:46 PM.  Always use your most recent med list.               B-D ULTRAFINE III SHORT PEN 31G X 8 MM Misc  Generic drug:  Insulin Pen Needle  USE WITH VICTOZA AS DIRECTED     glimepiride 2 MG tablet  Commonly known as:  AMARYL  Take 2 mg by mouth 2 (two) times daily.     Insulin Detemir 100 UNIT/ML Pen  Commonly known as:  LEVEMIR  Inject 16 Units into the skin daily.     lisinopril-hydrochlorothiazide 10-12.5 MG per tablet  Commonly known as:  PRINZIDE,ZESTORETIC  Take 1 tablet by mouth daily.     metFORMIN 750 MG 24 hr tablet  Commonly known as:  GLUCOPHAGE-XR  Take 750 mg by mouth 3 (three) times daily.     potassium chloride SA 20  MEQ tablet  Commonly known as:  K-DUR,KLOR-CON  Take 1 tablet (20 mEq total) by mouth daily.     rosuvastatin 10 MG tablet  Commonly known as:  CRESTOR  Take 10 mg by mouth daily.     VICTOZA 18 MG/3ML Sopn  Generic drug:  Liraglutide  INJECT 1.8 MG SUBCUTANEOUSLY DAILY        Allergies: No Known Allergies  Past Medical History  Diagnosis Date  . 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 ASCUS (atypical squamous cells of undetermined significance) on Pap smear 08/06/1999  . Breast mass in female 2002    Left  . Yeast vaginitis 2006  . Fibroid uterus 2010  . Irregular bleeding 2011    No past surgical history on file.  Family History  Problem Relation Age of Onset  . Diabetes Mother   . Diabetes Father   . Mental retardation Sister     Social History:  reports that she has never smoked. She has never used smokeless tobacco. She reports that she drinks alcohol. She reports that she does not use illicit drugs.  REVIEW of systems:  Her potassium is still low despite starting a supplement last month.  She is currently being followed by her PCP for hypertension. Blood pressure is well controlled with Zestoretic  Has had hypercholesterolemia, taking Crestor  Lab Results  Component Value Date   CHOL 167 03/29/2013   HDL 54.70 03/29/2013   LDLCALC 88 03/29/2013   TRIG 121.0 03/29/2013   CHOLHDL 3 03/29/2013      Examination:   BP 122/80  Pulse 85  Temp(Src) 97.6 F (36.4 C)  Resp 16  Ht 5\' 9"  (1.753 m)  Wt 240 lb (108.863 kg)  BMI 35.43 kg/m2  SpO2 99%  Body mass index is 35.43 kg/(m^2).    Assessment/Plan:   Diabetes type 2, uncontrolled  The patient's diabetes is improving with starting basal insulin She is now up to 16 units of Levemir but her morning sugars are still somewhat high She is also not checking readings after meals recently Although she has not gained weight  she can do significantly better with watching her diet  and more regular exercise  Discussed fasting blood sugar targets and she can go up to 18 units for now to have fasting readings consistently below 130 May need to consider mealtime insulin also if she has persistently high postprandial readings A1c to be checked in 2 months  Hypokalemia: She will discuss going off HCTZ on her next visit with her PCP, meanwhile needs to take potassium twice a day   Zakhi Dupre 08/04/2013, 1:46 PM

## 2013-08-09 ENCOUNTER — Other Ambulatory Visit: Payer: Self-pay | Admitting: *Deleted

## 2013-08-09 ENCOUNTER — Telehealth: Payer: Self-pay | Admitting: Endocrinology

## 2013-08-09 MED ORDER — POTASSIUM CHLORIDE CRYS ER 20 MEQ PO TBCR: EXTENDED_RELEASE_TABLET | ORAL | Status: DC

## 2013-08-09 MED ORDER — ONETOUCH DELICA LANCETS 33G MISC: Status: DC

## 2013-08-09 MED ORDER — GLUCOSE BLOOD VI STRP: ORAL_STRIP | Status: DC

## 2013-08-09 NOTE — Telephone Encounter (Signed)
Pt called in this afternoon and stated Dr.Kumar changed her potassium RX called in [facility] In addition pt also needs lancets and test strips sent to [facility] One [facility] Ultra [facility]  [facility] [phone]  Thank you :)

## 2013-08-15 ENCOUNTER — Other Ambulatory Visit: Payer: Self-pay | Admitting: Endocrinology

## 2013-09-14 ENCOUNTER — Other Ambulatory Visit: Payer: Self-pay | Admitting: Endocrinology

## 2013-09-30 ENCOUNTER — Other Ambulatory Visit: Payer: Self-pay | Admitting: Endocrinology

## 2013-10-01 ENCOUNTER — Other Ambulatory Visit (INDEPENDENT_AMBULATORY_CARE_PROVIDER_SITE_OTHER): Payer: BC Managed Care – PPO

## 2013-10-01 DIAGNOSIS — E1165 Type 2 diabetes mellitus with hyperglycemia: Principal | ICD-10-CM

## 2013-10-01 DIAGNOSIS — IMO0001 Reserved for inherently not codable concepts without codable children: Secondary | ICD-10-CM

## 2013-10-01 LAB — MICROALBUMIN / CREATININE URINE RATIO
Creatinine,U: 17.2 mg/dL
Microalb Creat Ratio: 5.8 mg/g (ref 0.0–30.0)
Microalb, Ur: 1 mg/dL (ref 0.0–1.9)

## 2013-10-01 LAB — HEMOGLOBIN A1C: Hgb A1c MFr Bld: 7.4 % — ABNORMAL HIGH (ref 4.6–6.5)

## 2013-10-01 LAB — URINALYSIS, ROUTINE W REFLEX MICROSCOPIC
BILIRUBIN URINE: NEGATIVE
Hgb urine dipstick: NEGATIVE
Ketones, ur: NEGATIVE
Leukocytes, UA: NEGATIVE
NITRITE: NEGATIVE
PH: 6 (ref 5.0–8.0)
Specific Gravity, Urine: <=1.005 — AB (ref 1.000–1.030)
TOTAL PROTEIN, URINE-UPE24: NEGATIVE
Urine Glucose: NEGATIVE
Urobilinogen, UA: 0.2 (ref 0.0–1.0)

## 2013-10-04 LAB — COMPREHENSIVE METABOLIC PANEL
ALK PHOS: 47 U/L (ref 39–117)
ALT: 16 U/L (ref 0–35)
AST: 31 U/L (ref 0–37)
Albumin: 4.2 g/dL (ref 3.5–5.2)
BUN: 16 mg/dL (ref 6–23)
CALCIUM: 10 mg/dL (ref 8.4–10.5)
CHLORIDE: 105 meq/L (ref 96–112)
CO2: 26 mEq/L (ref 19–32)
CREATININE: 0.9 mg/dL (ref 0.4–1.2)
GFR: 83.93 mL/min (ref >60.00)
Glucose, Bld: 118 mg/dL — ABNORMAL HIGH (ref 70–99)
Potassium: 4.7 mEq/L (ref 3.5–5.1)
Sodium: 143 mEq/L (ref 135–145)
Total Bilirubin: 0.4 mg/dL (ref 0.3–1.2)
Total Protein: 8 g/dL (ref 6.0–8.3)

## 2013-10-04 LAB — LIPID PANEL
Cholesterol: 159 mg/dL (ref 0–200)
HDL: 47.2 mg/dL (ref >39.00)
LDL Cholesterol: 76 mg/dL (ref 0–99)
TRIGLYCERIDES: 177 mg/dL — AB (ref 0.0–149.0)
Total CHOL/HDL Ratio: 3
VLDL: 35.4 mg/dL (ref 0.0–40.0)

## 2013-10-07 ENCOUNTER — Ambulatory Visit: Payer: BC Managed Care – PPO | Admitting: Endocrinology

## 2013-10-14 ENCOUNTER — Encounter: Payer: Self-pay | Admitting: Endocrinology

## 2013-10-14 ENCOUNTER — Ambulatory Visit (INDEPENDENT_AMBULATORY_CARE_PROVIDER_SITE_OTHER): Payer: BC Managed Care – PPO | Admitting: Endocrinology

## 2013-10-14 VITALS — BP 116/78 | HR 86 | Temp 97.8°F | Resp 16 | Ht 69.0 in | Wt 242.6 lb

## 2013-10-14 DIAGNOSIS — IMO0001 Reserved for inherently not codable concepts without codable children: Secondary | ICD-10-CM

## 2013-10-14 DIAGNOSIS — E1165 Type 2 diabetes mellitus with hyperglycemia: Principal | ICD-10-CM

## 2013-10-14 MED ORDER — GLIMEPIRIDE 4 MG PO TABS: 2.0000 mg | ORAL_TABLET | Freq: Every day | ORAL | Status: DC

## 2013-10-14 NOTE — Patient Instructions (Addendum)
Levemir 22 units daily, target am sugar 110-140  More sugars after dinner  Amaryl at supper

## 2013-10-14 NOTE — Progress Notes (Signed)
Patient ID: Alyssa Ross, female   DOB: 1966/01/16, 48 y.o.   MRN: [id number]   Reason for Appointment: Diabetes follow-up   History of Present Illness   Diagnosis: Type 2 DIABETES MELITUS, date of diagnosis 2005   She had previously been on metformin and Amaryl Because of inadequate control she was given Victoza in addition in 2011 With this her blood sugars have been significantly better and her A1c has been as low as 6.2 in 2013 Her blood sugars have been more difficult to control since 2014 an A1c consistently over 7%  RECENT history:  She  has been on insulin with Levemir 8 units since 06/2013 because of  increase in her A1c to 8.5% She had previously been taking Victoza along with Amaryl and metformin without consistent control Also has had difficulty watching her diet especially with portions and snacks like ice cream However her blood sugars are not improved and they appear to be relatively higher in the morning now She has only increased her insulin 2 units even though she was instructed on trying to get the morning sugars below 130 A1c is still over 7% Hyperglycemia: Blood sugars are high usually in the morning but she has only a couple of readings in the daytime and evenings with variable levels She has been still compliant with her exercising routine but has not lost weight despite using the 3.0 dosage of Victoza from the weight loss Center INSULIN dose: 18 units  Oral hypoglycemic drugs: Amaryl 4mg  bid and metformin 2250 mg       Side effects from medications: None  Monitors blood glucose: Once a day   Glucometer: One Touch.          Blood Glucose readings in the last 2 weeks:  PREMEAL Breakfast  late a.m.   5 PM   8 PM  Overall  Glucose range:  131-227   116-198   238   153, 222    Mean/median:      178    Hypoglycemia: None      Meals: 3 meals per day.       DIET: She is usually able to control portions usually but has been unable to control higher carb foods and sweets;   Physical activity: exercise: Going up to 5 days a week for 45 minutes,              Wt Readings from Last 3 Encounters:  10/14/13 242 lb 9.6 oz (110.043 kg)  08/04/13 240 lb (108.863 kg)  07/09/13 240 lb 12.8 oz (109.226 kg)    LABS:  Lab Results  Component Value Date   HGBA1C 7.4* 10/01/2013   HGBA1C 8.5* 07/06/2013   HGBA1C 7.8* 03/29/2013   Lab Results  Component Value Date   MICROALBUR 1.0 10/01/2013   LDLCALC 76 10/01/2013   CREATININE 0.9 10/01/2013    No visits with results within 1 Week(s) from this visit. Latest known visit with results is:  Appointment on 10/01/2013  Component Date Value Ref Range Status  . Hemoglobin A1C 10/01/2013 7.4* 4.6 - 6.5 % Final   Glycemic Control Guidelines for People with Diabetes:Non Diabetic:  <6%Goal of Therapy: <7%Additional Action Suggested:  >8%   . Sodium 10/01/2013 143  135 - 145 mEq/L Final  . Potassium 10/01/2013 4.7  3.5 - 5.1 mEq/L Final  . Chloride 10/01/2013 105  96 - 112 mEq/L Final  . CO2 10/01/2013 26  19 - 32 mEq/L Final  . Glucose, Bld 10/01/2013 118*  70 - 99 mg/dL Final  . BUN 10/01/2013 16  6 - 23 mg/dL Final  . Creatinine, Ser 10/01/2013 0.9  0.4 - 1.2 mg/dL Final  . Total Bilirubin 10/01/2013 0.4  0.3 - 1.2 mg/dL Final  . Alkaline Phosphatase 10/01/2013 47  39 - 117 U/L Final  . AST 10/01/2013 31  0 - 37 U/L Final  . ALT 10/01/2013 16  0 - 35 U/L Final  . Total Protein 10/01/2013 8.0  6.0 - 8.3 g/dL Final  . Albumin 10/01/2013 4.2  3.5 - 5.2 g/dL Final  . Calcium 10/01/2013 10.0  8.4 - 10.5 mg/dL Final  . GFR 10/01/2013 83.93  >60.00 mL/min Final  . Cholesterol 10/01/2013 159  0 - 200 mg/dL Final   ATP III Classification       Desirable:  < 200 mg/dL               Borderline High:  200 - 239 mg/dL          High:  > = 240 mg/dL  . Triglycerides 10/01/2013 177.0* 0.0 - 149.0 mg/dL Final   Normal:  <150 mg/dLBorderline High:  150 - 199 mg/dL  . HDL 10/01/2013 47.20  >39.00 mg/dL Final  . VLDL 10/01/2013 35.4   0.0 - 40.0 mg/dL Final  . LDL Cholesterol 10/01/2013 76  0 - 99 mg/dL Final  . Total CHOL/HDL Ratio 10/01/2013 3   Final                  Men          Women1/2 Average Risk     3.4          3.3Average Risk          5.0          4.42X Average Risk          9.6          7.13X Average Risk          15.0          11.0                      . Microalb, Ur 10/01/2013 1.0  0.0 - 1.9 mg/dL Final  . Creatinine,U 10/01/2013 17.2   Final  . Microalb Creat Ratio 10/01/2013 5.8  0.0 - 30.0 mg/g Final  . Color, Urine 10/01/2013 YELLOW  Yellow;Lt. Yellow Final  . APPearance 10/01/2013 CLEAR  Clear Final  . Specific Gravity, Urine 10/01/2013 <=1.005* 1.000 - 1.030 Final  . pH 10/01/2013 6.0  5.0 - 8.0 Final  . Total Protein, Urine 10/01/2013 NEGATIVE  Negative Final  . Urine Glucose 10/01/2013 NEGATIVE  Negative Final  . Ketones, ur 10/01/2013 NEGATIVE  Negative Final  . Bilirubin Urine 10/01/2013 NEGATIVE  Negative Final  . Hgb urine dipstick 10/01/2013 NEGATIVE  Negative Final  . Urobilinogen, UA 10/01/2013 0.2  0.0 - 1.0 Final  . Leukocytes, UA 10/01/2013 NEGATIVE  Negative Final  . Nitrite 10/01/2013 NEGATIVE  Negative Final  . WBC, UA 10/01/2013 0-2/hpf  0-2/hpf Final  . RBC / HPF 10/01/2013 0-2/hpf  0-2/hpf Final  . Squamous Epithelial / LPF 10/01/2013 Rare(0-4/hpf)  Rare(0-4/hpf) Final      Medication List       This list is accurate as of: 10/14/13  1:57 PM.  Always use your most recent med list.               B-D ULTRAFINE III  SHORT PEN 31G X 8 MM Misc  Generic drug:  Insulin Pen Needle  USE WITH VICTOZA AS DIRECTED     glimepiride 2 MG tablet  Commonly known as:  AMARYL  Take 2 mg by mouth 2 (two) times daily.     glucose blood test strip  Commonly known as:  ONE TOUCH ULTRA TEST  Use as instructed to check blood sugars 2 times per day     Insulin Detemir 100 UNIT/ML Pen  Commonly known as:  LEVEMIR  Inject 18 Units into the skin daily.     lisinopril 10 MG tablet  Commonly  known as:  PRINIVIL,ZESTRIL  Takes 4 times a week     lisinopril-hydrochlorothiazide 10-12.5 MG per tablet  Commonly known as:  PRINZIDE,ZESTORETIC  Take 1 tablet by mouth daily. Takes 1 tablet Monday, Wednesday and Friday     metFORMIN 750 MG 24 hr tablet  Commonly known as:  GLUCOPHAGE-XR  TAKE 3 TABLETS BY MOUTH ONCE A DAY PER DOCTOR. Anye Brose     ONETOUCH DELICA LANCETS 21J Misc  Use to check blood sugars 2 times per day     potassium chloride SA 20 MEQ tablet  Commonly known as:  K-DUR,KLOR-CON  Take 1 tablet 2 times daily     rosuvastatin 10 MG tablet  Commonly known as:  CRESTOR  Take 10 mg by mouth daily.     VICTOZA 18 MG/3ML Sopn  Generic drug:  Liraglutide  INJECT 3.0 MG SUBCUTANEOUSLY EVERY DAY     Vitamin D (Ergocalciferol) 50000 UNITS Caps capsule  Commonly known as:  DRISDOL        Allergies: No Known Allergies  Past Medical History  Diagnosis Date  . 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 ASCUS (atypical squamous cells of undetermined significance) on Pap smear 08/06/1999  . Breast mass in female 2002    Left  . Yeast vaginitis 2006  . Fibroid uterus 2010  . Irregular bleeding 2011    No past surgical history on file.  Family History  Problem Relation Age of Onset  . Diabetes Mother   . Diabetes Father   . Mental retardation Sister     Social History:  reports that she has never smoked. She has never used smokeless tobacco. She reports that she drinks alcohol. She reports that she does not use illicit drugs.  REVIEW of systems:  She is currently being followed by her PCP for hypertension. Blood pressure is well controlled with Zestoretic  Has had hypercholesterolemia, taking Crestor with good control, mild increase in triglycerides also  Lab Results  Component Value Date   CHOL 159 10/01/2013   HDL 47.20 10/01/2013   LDLCALC 76 10/01/2013   TRIG 177.0* 10/01/2013   CHOLHDL 3 10/01/2013      Examination:   BP  116/78  Pulse 86  Temp(Src) 97.8 F (36.6 C)  Resp 16  Ht 5\' 9"  (1.753 m)  Wt 242 lb 9.6 oz (110.043 kg)  BMI 35.81 kg/m2  SpO2 97%  Body mass index is 35.81 kg/(m^2).    Assessment/Plan:   Diabetes type 2, uncontrolled  The patient's diabetes is somewhat better with starting basal insulin but still suboptimal Her fasting readings are somewhat higher than the last time and she has not titrated her insulin as directed Discussed that she is still taking relatively small dose of 18 units However not clear what her postprandial readings are also She is reluctant to take Invokana because of history of  frequent and severe candidiasis Also currently not able to lose weight even with taking 3.0 mg of Victoza  Recommendations made today  Discussed specifically how to adjust her Levemir every 3 days to get blood sugars at least below 140 in the morning  Will try Amaryl again at suppertime to help with evening and overnight blood sugars  She will discuss Qsymia with her weight loss clinic which would help with portion control and carbohydrate cravings also   Elayne Snare 10/14/2013, 1:57 PM

## 2013-11-16 ENCOUNTER — Telehealth: Payer: Self-pay | Admitting: *Deleted

## 2013-11-16 ENCOUNTER — Other Ambulatory Visit: Payer: Self-pay | Admitting: *Deleted

## 2013-11-16 MED ORDER — ONETOUCH DELICA LANCING DEV MISC: Status: DC

## 2013-11-16 NOTE — Telephone Encounter (Signed)
Blood glucose monitor is broke what's to know if she could get another one one touch ultra 2

## 2013-11-16 NOTE — Telephone Encounter (Signed)
Patients lancing device broke, new rx for one touch delica lancing device sent to patients pharmacy.

## 2013-12-14 ENCOUNTER — Other Ambulatory Visit (INDEPENDENT_AMBULATORY_CARE_PROVIDER_SITE_OTHER): Payer: BC Managed Care – PPO

## 2013-12-14 DIAGNOSIS — IMO0001 Reserved for inherently not codable concepts without codable children: Secondary | ICD-10-CM

## 2013-12-14 DIAGNOSIS — E1165 Type 2 diabetes mellitus with hyperglycemia: Principal | ICD-10-CM

## 2013-12-14 LAB — LDL CHOLESTEROL, DIRECT: Direct LDL: 117.8 mg/dL

## 2013-12-14 LAB — LIPID PANEL
CHOLESTEROL: 197 mg/dL (ref 0–200)
HDL: 50.2 mg/dL (ref >39.00)
LDL CALC: 99 mg/dL (ref 0–99)
NonHDL: 146.8
Total CHOL/HDL Ratio: 4
Triglycerides: 239 mg/dL — ABNORMAL HIGH (ref 0.0–149.0)
VLDL: 47.8 mg/dL — AB (ref 0.0–40.0)

## 2013-12-14 LAB — BASIC METABOLIC PANEL
BUN: 17 mg/dL (ref 6–23)
CHLORIDE: 97 meq/L (ref 96–112)
CO2: 33 meq/L — AB (ref 19–32)
Calcium: 10 mg/dL (ref 8.4–10.5)
Creatinine, Ser: 0.9 mg/dL (ref 0.4–1.2)
GFR: 88.27 mL/min (ref >60.00)
Glucose, Bld: 137 mg/dL — ABNORMAL HIGH (ref 70–99)
Potassium: 3.8 mEq/L (ref 3.5–5.1)
SODIUM: 135 meq/L (ref 135–145)

## 2013-12-17 ENCOUNTER — Encounter: Payer: Self-pay | Admitting: Endocrinology

## 2013-12-17 ENCOUNTER — Ambulatory Visit (INDEPENDENT_AMBULATORY_CARE_PROVIDER_SITE_OTHER): Payer: BC Managed Care – PPO | Admitting: Endocrinology

## 2013-12-17 VITALS — BP 128/84 | HR 81 | Temp 98.1°F | Resp 16 | Ht 69.0 in | Wt 245.4 lb

## 2013-12-17 DIAGNOSIS — IMO0001 Reserved for inherently not codable concepts without codable children: Secondary | ICD-10-CM

## 2013-12-17 DIAGNOSIS — E785 Hyperlipidemia, unspecified: Secondary | ICD-10-CM

## 2013-12-17 DIAGNOSIS — E1165 Type 2 diabetes mellitus with hyperglycemia: Principal | ICD-10-CM

## 2013-12-17 DIAGNOSIS — I10 Essential (primary) hypertension: Secondary | ICD-10-CM

## 2013-12-17 LAB — FRUCTOSAMINE: Fructosamine: 337 umol/L — ABNORMAL HIGH (ref 190–270)

## 2013-12-17 NOTE — Progress Notes (Signed)
Patient ID: Alyssa Ross, female   DOB: 1965/08/10, 48 y.o.   MRN: [id number]   Reason for Appointment: Diabetes follow-up   History of Present Illness   Diagnosis: Type 2 DIABETES MELITUS, date of diagnosis 2005   She had previously been on metformin and Amaryl Because of inadequate control she was given Victoza in addition in 2011 With this her blood sugars have been significantly better and her A1c has been as low as 6.2 in 2013 Her blood sugars have been more difficult to control since 2014 an A1c consistently over 7%  RECENT history:  She  was started on insulin with Levemir in 06/2013 because of  increase in her A1c to 8.5% She had previously been taking Victoza along with Amaryl and metformin without consistent control Also has had difficulty watching her diet especially with portions and eating sweets This is despite a trial of 3.0 mg Victoza She has significant variability in her blood sugars in the mornings; recently has gone back to taking Amaryl in the evening and sugars may be better with this. Not checking many readings after her evening meal but these are mostly high A1c has been over 7% and fructosamine is higher on this visit  INSULIN dose: 18 units  Oral hypoglycemic drugs: Amaryl 4mg  hs and metformin 2250 mg       Side effects from medications: None  Monitors blood glucose: Once a day   Glucometer: One Touch.          Blood Glucose readings in the last 4 weeks:  Checking blood sugar mostly in the morning  PREMEAL Breakfast Lunch Dinner Bedtime Overall  Glucose range:  85-220   134-199   142-311   150-279    Median:  167     253   169    Hypoglycemia: None      Meals: 3 meals per day.       DIET: She is usually able to control portions usually but has been unable to control higher carb foods and sweets;  Physical activity: exercise: Going 2 days a week to the gym for 45 minutes,              Wt Readings from Last 3 Encounters:  12/17/13 245 lb 6.4 oz (111.313 kg)   10/14/13 242 lb 9.6 oz (110.043 kg)  08/04/13 240 lb (108.863 kg)    LABS:  Lab Results  Component Value Date   HGBA1C 7.4* 10/01/2013   HGBA1C 8.5* 07/06/2013   HGBA1C 7.8* 03/29/2013   Lab Results  Component Value Date   MICROALBUR 1.0 10/01/2013   [place] 99 12/14/2013   CREATININE 0.9 12/14/2013    Appointment on 12/14/2013  Component Date Value Ref Range Status  . Sodium 12/14/2013 135  135 - 145 mEq/L Final  . Potassium 12/14/2013 3.8  3.5 - 5.1 mEq/L Final  . Chloride 12/14/2013 97  96 - 112 mEq/L Final  . CO2 12/14/2013 33* 19 - 32 mEq/L Final  . Glucose, Bld 12/14/2013 137* 70 - 99 mg/dL Final  . BUN 12/14/2013 17  6 - 23 mg/dL Final  . Creatinine, Ser 12/14/2013 0.9  0.4 - 1.2 mg/dL Final  . Calcium 12/14/2013 10.0  8.4 - 10.5 mg/dL Final  . GFR 12/14/2013 88.27  >60.00 mL/min Final  . Fructosamine 12/14/2013 337* 190 - 270 umol/L Final  . Cholesterol 12/14/2013 197  0 - 200 mg/dL Final   ATP III Classification       Desirable:  <  200 mg/dL               Borderline High:  200 - 239 mg/dL          High:  > = 240 mg/dL  . Triglycerides 12/14/2013 239.0* 0.0 - 149.0 mg/dL Final   Normal:  <150 mg/dLBorderline High:  150 - 199 mg/dL  . HDL 12/14/2013 50.20  >39.00 mg/dL Final  . VLDL 12/14/2013 47.8* 0.0 - 40.0 mg/dL Final  . LDL Cholesterol 12/14/2013 99  0 - 99 mg/dL Final  . Total CHOL/HDL Ratio 12/14/2013 4   Final                  Men          Women1/2 Average Risk     3.4          3.3Average Risk          5.0          4.42X Average Risk          9.6          7.13X Average Risk          15.0          11.0                      . NonHDL 12/14/2013 146.80   Final  . Direct LDL 12/14/2013 117.8   Final   Optimal:  <100 mg/dLNear or Above Optimal:  100-129 mg/dLBorderline High:  130-159 mg/dLHigh:  160-189 mg/dLVery High:  >190 mg/dL      Medication List       This list is accurate as of: 12/17/13  1:55 PM.  Always use your most recent med list.                B-D ULTRAFINE III SHORT PEN 31G X 8 MM Misc  Generic drug:  Insulin Pen Needle  USE WITH VICTOZA AS DIRECTED     glimepiride 4 MG tablet  Commonly known as:  AMARYL  Take 0.5 tablets (2 mg total) by mouth daily with supper.     glucose blood test strip  Commonly known as:  ONE TOUCH ULTRA TEST  Use as instructed to check blood sugars 2 times per day     Insulin Detemir 100 UNIT/ML Pen  Commonly known as:  LEVEMIR  Inject 28 Units into the skin daily.     lisinopril 10 MG tablet  Commonly known as:  PRINIVIL,ZESTRIL  Takes 4 times a week     lisinopril-hydrochlorothiazide 10-12.5 MG per tablet  Commonly known as:  PRINZIDE,ZESTORETIC  Take 1 tablet by mouth daily. Takes 1 tablet Monday, Wednesday and Friday     metFORMIN 750 MG 24 hr tablet  Commonly known as:  GLUCOPHAGE-XR  TAKE 3 TABLETS BY MOUTH ONCE A DAY PER DOCTOR. Jamyria Ozanich     ONE TOUCH DELICA LANCING DEV Misc  Use to check blood sugar 2 times per day     ONETOUCH DELICA LANCETS 10G Misc  Use to check blood sugars 2 times per day     potassium chloride SA 20 MEQ tablet  Commonly known as:  K-DUR,KLOR-CON  Take 1 tablet 2 times daily     rosuvastatin 10 MG tablet  Commonly known as:  CRESTOR  Take 10 mg by mouth daily.     VICTOZA 18 MG/3ML Sopn  Generic drug:  Liraglutide  INJECT 1.8 MG SUBCUTANEOUSLY EVERY DAY  Vitamin D (Ergocalciferol) 50000 UNITS Caps capsule  Commonly known as:  DRISDOL        Allergies: No Known Allergies  Past Medical History  Diagnosis Date  . 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 ASCUS (atypical squamous cells of undetermined significance) on Pap smear 08/06/1999  . Breast mass in female 2002    Left  . Yeast vaginitis 2006  . Fibroid uterus 2010  . Irregular bleeding 2011    No past surgical history on file.  Family History  Problem Relation Age of Onset  . Diabetes Mother   . Diabetes Father   . Mental retardation Sister     Social  History:  reports that she has never smoked. She has never used smokeless tobacco. She reports that she drinks alcohol. She reports that she does not use illicit drugs.  REVIEW of systems:  She is currently being followed by her PCP for hypertension. Blood pressure is well controlled with Zestoretic  Has had hypercholesterolemia, taking Crestor with good control, mild increase in triglycerides also  Lab Results  Component Value Date   CHOL 197 12/14/2013   HDL 50.20 12/14/2013   LDLCALC 99 12/14/2013   LDLDIRECT 117.8 12/14/2013   TRIG 239.0* 12/14/2013   CHOLHDL 4 12/14/2013      Examination:   BP 128/84  Pulse 81  Temp(Src) 98.1 F (36.7 C)  Resp 16  Ht 5\' 9"  (1.753 m)  Wt 245 lb 6.4 oz (111.313 kg)  BMI 36.22 kg/m2  SpO2 98%  Body mass index is 36.22 kg/(m^2).    Assessment/Plan:   Diabetes type 2, uncontrolled  The patient's diabetes control is still suboptimal as discussed in history of present illness Fructosamine is higher indicating worsening control Her fasting readings are overall high but recently may be improving especially with adding Amaryl in the evening Not clear if she needs more basal insulin since blood sugar was only 85 this morning However not clear what her postprandial readings are since she is checking infrequently and most of them are high She is reluctant to take Invokana because of history of frequent and severe candidiasis Also reluctant to try a different weight loss drug and is not going to the weight loss program Discussed progression of diabetes, blood sugar targets, adjustment of Levemir insulin, other treatment options  Since her blood sugars appear to be somewhat better recently will try to have her work harder on lifestyle changes as well as more readings after meals. Again discussed possibility of needing mealtime insulin at suppertime However she may benefit from trying to take them and before supper rather than at bedtime Will need followup  A1c in about 2 months  Hyperlipidemia: LDL is over 100 and needs a higher dose of Crestor now  Hypertension: Blood pressure is slightly increased, she will discuss with PCP  Patient Instructions  Crestor 20mg  daily  Please check blood sugars at least half the time about 2 hours after any meal and times per week on waking up. Please bring blood sugar monitor to each visit  Exercise daily  Take Amaryl before supper   Counseling time over 50% of today's 25 minute visit   Deangela Randleman 12/17/2013, 1:55 PM

## 2013-12-17 NOTE — Patient Instructions (Addendum)
Crestor 20mg  daily  Please check blood sugars at least half the time about 2 hours after any meal and times per week on waking up. Please bring blood sugar monitor to each visit  Exercise daily  Take Amaryl before supper

## 2013-12-20 ENCOUNTER — Telehealth: Payer: Self-pay | Admitting: Endocrinology

## 2013-12-20 ENCOUNTER — Other Ambulatory Visit: Payer: Self-pay | Admitting: *Deleted

## 2013-12-20 MED ORDER — ROSUVASTATIN CALCIUM 20 MG PO TABS: 20.0000 mg | ORAL_TABLET | Freq: Every day | ORAL | Status: DC

## 2013-12-20 NOTE — Telephone Encounter (Signed)
rx sent

## 2013-12-20 NOTE — Telephone Encounter (Signed)
Pt crestor call in 20 mg she is completely out of the 10 mg

## 2013-12-24 ENCOUNTER — Other Ambulatory Visit: Payer: Self-pay | Admitting: Obstetrics and Gynecology

## 2013-12-27 ENCOUNTER — Encounter (HOSPITAL_COMMUNITY): Payer: Self-pay | Admitting: Pharmacy Technician

## 2014-01-13 ENCOUNTER — Encounter (HOSPITAL_COMMUNITY): Payer: Self-pay

## 2014-01-13 ENCOUNTER — Encounter (HOSPITAL_COMMUNITY)
Admission: RE | Admit: 2014-01-13 | Discharge: 2014-01-13 | Disposition: A | Discharge status: Home / Self Care | Payer: BC Managed Care – PPO | Source: Ambulatory Visit | Attending: Obstetrics and Gynecology | Admitting: Obstetrics and Gynecology

## 2014-01-13 DIAGNOSIS — Z01812 Encounter for preprocedural laboratory examination: Secondary | ICD-10-CM | POA: Insufficient documentation

## 2014-01-13 DIAGNOSIS — Z01818 Encounter for other preprocedural examination: Secondary | ICD-10-CM | POA: Insufficient documentation

## 2014-01-13 HISTORY — DX: Type 2 diabetes mellitus without complications: E11.9

## 2014-01-13 HISTORY — DX: Essential (primary) hypertension: I10

## 2014-01-13 LAB — BASIC METABOLIC PANEL
ANION GAP: 12 (ref 5–15)
BUN: 19 mg/dL (ref 6–23)
CALCIUM: 9.9 mg/dL (ref 8.4–10.5)
CO2: 30 meq/L (ref 19–32)
Chloride: 99 mEq/L (ref 96–112)
Creatinine, Ser: 1.08 mg/dL (ref 0.50–1.10)
GFR calc Af Amer: 70 mL/min — ABNORMAL LOW (ref >90)
GFR calc non Af Amer: 60 mL/min — ABNORMAL LOW (ref >90)
Glucose, Bld: 147 mg/dL — ABNORMAL HIGH (ref 70–99)
Potassium: 4.1 mEq/L (ref 3.7–5.3)
SODIUM: 141 meq/L (ref 137–147)

## 2014-01-13 LAB — CBC
HCT: 36.1 % (ref 36.0–46.0)
Hemoglobin: 12.3 g/dL (ref 12.0–15.0)
MCH: 29.6 pg (ref 26.0–34.0)
MCHC: 34.1 g/dL (ref 30.0–36.0)
MCV: 87 fL (ref 78.0–100.0)
PLATELETS: 373 10*3/uL (ref 150–400)
RBC: 4.15 MIL/uL (ref 3.87–5.11)
RDW: 12.4 % (ref 11.5–15.5)
WBC: 5.9 10*3/uL (ref 4.0–10.5)

## 2014-01-13 NOTE — Pre-Procedure Instructions (Signed)
EKG and medical history reviewed with Dr Jillyn Hidden. No orders given.

## 2014-01-13 NOTE — Patient Instructions (Signed)
[place]  01/13/2014   Your procedure is scheduled on:  01/20/14  Enter through the Main Entrance of [facility] [place] up the phone at the desk and dial [phone].   Call this number if you have problems the morning of surgery: [phone]   Remember:   Do not eat food:After Midnight.  Do not drink clear liquids: After Midnight.  Take these medicines the morning of surgery with A SIP OF WATER: blood pressure medication, hold Metformin for 24 hrs   Do not wear jewelry, make-up or nail polish.  Do not wear lotions, powders, or perfumes. You may wear deodorant.  Do not shave 48 hours prior to surgery.  Do not bring valuables to the hospital.  [facility] is not   responsible for any belongings or valuables brought to the hospital.  Contacts, dentures or bridgework may not be worn into surgery.  Leave suitcase in the car. After surgery it may be brought to your room.  For patients admitted to the hospital, checkout time is 11:00 AM the day of              discharge.   Patients discharged the day of surgery will not be allowed to drive             home.  Name and phone number of your driver: sister   Alyssa Ross  Special Instructions:      Please read over the following fact sheets that you were given:   Surgical Site Infection Prevention

## 2014-01-17 ENCOUNTER — Other Ambulatory Visit (HOSPITAL_COMMUNITY): Payer: Self-pay | Admitting: Obstetrics and Gynecology

## 2014-01-17 NOTE — H&P (Signed)
Alyssa Ross is a 48 y.o. female G:0 for hysteroscopy, dilatation, curettage with removal of endometrial mass because of post menopausal bleeding.  Patient's last menses was in 2014 until June 2016 when she began spotting that was accompanied by menstrual-like symptoms.  This bleeding lasted for 3 days in which she also had mild cramping rated 3/10 on a 10 point pain scale.  She denies any post-coital bleeding, changes in bladder or bowel function.  An endometrial biopsy in June 2015 showed weakly proliferative endometrium with no atypia, hyperplasia or malignancy. A sono-hysterogram at that same time showed: uterus 11.74 x 9. 47 x 7.78 cm, endometrium 2.08 mm that contained , #4 fibroids with the largest, a right pedunculated  measuring 8. 74 cm and 2.9 cm mass consistent with a polyp, both ovaries appeared normal.  Given the findings on imaging and presenting symptoms the patient has decided to proceed with surgical management for her symptoms. Past Medical History  OB History: G: 0  GYN History: menarche: 48 YO    LMP: 2014    Contracepton: Menopausal/Abstinence;  Remote history of abnormal PAP smear-no treatment;  Last PAP smear-02/2013-normal  Medical History: Hypertension, Diabetes Mellitus, Anemia and Hypercholesterolemia  Surgical History: None Denies problems with anesthesia or history of blood transfusions  Family History:Diabetes Mellitus, Hypertension, Elevated Cholesterol and Renal Disease.  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 B-D ULTRAFINE III SHORT PEN 31G X 8 MM MISC USE WITH VICTOZA AS DIRECTED  . glimepiride (AMARYL) 4 MG tablet Take 4 mg by mouth daily with supper.  Marland Kitchen glucose blood (ONE TOUCH ULTRA TEST) test strip Use as instructed to check blood sugars 2 times per day  . ibuprofen (ADVIL,MOTRIN) 200 MG tablet Take 400 mg by mouth every 6 (six)  hours as needed for headache.  . Insulin Detemir (LEVEMIR) 100 UNIT/ML Pen Inject 28 Units into the skin at bedtime.   Elmore Guise Devices (ONE TOUCH DELICA LANCING DEV) MISC Use to check blood sugar 2 times per day  . Liraglutide (VICTOZA) 18 MG/3ML SOPN INJECT 1.8 MG SUBCUTANEOUSLY EVERY DAY  . lisinopril-hydrochlorothiazide (PRINZIDE,ZESTORETIC) 10-12.5 MG per tablet Take 1 tablet by mouth daily. Takes 1 tablet Monday, Wednesday and Friday  . Loratadine (ALAVERT PO) Take 1 tablet by mouth daily as needed (For allergies.).  Marland Kitchen metFORMIN (GLUCOPHAGE-XR) 750 MG 24 hr tablet TAKE 3 TABLETS BY MOUTH ONCE A DAY PER DOCTOR. KUMAR  . ONETOUCH DELICA LANCETS 42V MISC Use to check blood sugars 2 times per day  . rosuvastatin (CRESTOR) 20 MG tablet Take 1 tablet (20 mg total) by mouth daily.  . Vitamin D, Ergocalciferol, (DRISDOL) 50000 UNITS CAPS capsule Take 50,000 Units by mouth 2 (two) times a week.     No Known Allergies  Denies sensitivity to peanuts, shellfish, soy, latex or adhesives.   ROS:   Denies  corrective lensees, headache, vision changes, nasal congestion, dysphagia, tinnitus, dizziness, hoarseness, cough,  chest pain, shortness of breath, nausea, vomiting, diarrhea,constipation,  urinary frequency, urgency  dysuria, hematuria, vaginitis symptoms, pelvic pain, swelling of joints,easy bruising,  myalgias, arthralgias, skin rashes, unexplained weight loss and except as is mentioned in the history of present illness, patient's review of systems is otherwise negative.  Physical Exam  Bp: 112/60   P: 88   R: 18   Temperature: 97.8 degrees F orally    Weight: 293.5 lbs.  Height: 5\' 9"    BMI: 43.3  Neck: supple without masses or thyromegaly Lungs: clear to auscultation Heart: regular rate and rhythm Abdomen: soft, non-tender and no organomegaly Pelvic:EGBUS- wnl; vagina-normal rugae; uterus-irregular 12-14 weeks size cervix without lesions or motion tenderness; adnexae-no tenderness or  masses Extremities:  no clubbing, cyanosis or edema   Assesment: Post Menopausal Bleeding           Endometrial Mass           Fibroids  Disposition:  A discussion was held with patient regarding the indication for her procedure(s) along with the risks, which include but are not limited to: reaction to anesthesia, damage to adjacent organs, infection and excessive bleeding. The patient verbalized understanding of these risks and has consented to proceed with Hysteroscopic Removal of Endometrial Mass, Dilatation and Curettage at [place] on January 20, 2014.   CSN# [id number]   Hayato Guaman J. Florene Glen, PA-C  for Dr. Franklyn Lor. Dillard

## 2014-01-20 ENCOUNTER — Encounter (HOSPITAL_COMMUNITY): Payer: BC Managed Care – PPO | Admitting: Anesthesiology

## 2014-01-20 ENCOUNTER — Encounter (HOSPITAL_COMMUNITY)
Admission: RE | Disposition: A | Discharge status: Home / Self Care | Payer: Self-pay | Source: Ambulatory Visit | Attending: Obstetrics and Gynecology

## 2014-01-20 ENCOUNTER — Encounter (HOSPITAL_COMMUNITY): Payer: Self-pay

## 2014-01-20 ENCOUNTER — Ambulatory Visit (HOSPITAL_COMMUNITY)
Admission: RE | Admit: 2014-01-20 | Discharge: 2014-01-20 | Disposition: A | Discharge status: Home / Self Care | Payer: BC Managed Care – PPO | Source: Ambulatory Visit | Attending: Obstetrics and Gynecology | Admitting: Obstetrics and Gynecology

## 2014-01-20 ENCOUNTER — Ambulatory Visit (HOSPITAL_COMMUNITY): Payer: BC Managed Care – PPO | Admitting: Anesthesiology

## 2014-01-20 DIAGNOSIS — I10 Essential (primary) hypertension: Secondary | ICD-10-CM | POA: Diagnosis not present

## 2014-01-20 DIAGNOSIS — Z8249 Family history of ischemic heart disease and other diseases of the circulatory system: Secondary | ICD-10-CM | POA: Diagnosis not present

## 2014-01-20 DIAGNOSIS — E7800 Pure hypercholesterolemia, unspecified: Secondary | ICD-10-CM | POA: Diagnosis not present

## 2014-01-20 DIAGNOSIS — E119 Type 2 diabetes mellitus without complications: Secondary | ICD-10-CM | POA: Diagnosis not present

## 2014-01-20 DIAGNOSIS — N924 Excessive bleeding in the premenopausal period: Secondary | ICD-10-CM

## 2014-01-20 DIAGNOSIS — N950 Postmenopausal bleeding: Secondary | ICD-10-CM | POA: Insufficient documentation

## 2014-01-20 DIAGNOSIS — N840 Polyp of corpus uteri: Secondary | ICD-10-CM | POA: Diagnosis not present

## 2014-01-20 DIAGNOSIS — D649 Anemia, unspecified: Secondary | ICD-10-CM | POA: Diagnosis not present

## 2014-01-20 DIAGNOSIS — Z833 Family history of diabetes mellitus: Secondary | ICD-10-CM | POA: Insufficient documentation

## 2014-01-20 DIAGNOSIS — D259 Leiomyoma of uterus, unspecified: Secondary | ICD-10-CM | POA: Insufficient documentation

## 2014-01-20 HISTORY — PX: DILATATION & CURETTAGE/HYSTEROSCOPY WITH TRUECLEAR: SHX6353

## 2014-01-20 HISTORY — PX: DILATATION & CURRETTAGE/HYSTEROSCOPY WITH RESECTOCOPE: SHX5572

## 2014-01-20 LAB — GLUCOSE, CAPILLARY
Glucose-Capillary: 131 mg/dL — ABNORMAL HIGH (ref 70–99)
Glucose-Capillary: 109 mg/dL — ABNORMAL HIGH (ref 70–99)

## 2014-01-20 LAB — PREGNANCY, URINE: Preg Test, Ur: NEGATIVE

## 2014-01-20 SURGERY — DILATATION & CURETTAGE/HYSTEROSCOPY WITH TRUCLEAR
Anesthesia: General | Site: Vagina

## 2014-01-20 MED ORDER — LIDOCAINE HCL 2 % IJ SOLN
INTRAMUSCULAR | Status: AC
  Filled 2014-01-20: qty 20

## 2014-01-20 MED ORDER — LIDOCAINE HCL (CARDIAC) 20 MG/ML IV SOLN
INTRAVENOUS | Status: AC
  Filled 2014-01-20: qty 5

## 2014-01-20 MED ORDER — FENTANYL CITRATE 0.05 MG/ML IJ SOLN
INTRAMUSCULAR | Status: DC | PRN
  Administered 2014-01-20 (×3): 50 ug via INTRAVENOUS

## 2014-01-20 MED ORDER — MIDAZOLAM HCL 2 MG/2ML IJ SOLN
INTRAMUSCULAR | Status: DC | PRN
  Administered 2014-01-20: 2 mg via INTRAVENOUS

## 2014-01-20 MED ORDER — MIDAZOLAM HCL 2 MG/2ML IJ SOLN
INTRAMUSCULAR | Status: AC
  Filled 2014-01-20: qty 2

## 2014-01-20 MED ORDER — LACTATED RINGERS IV SOLN
INTRAVENOUS | Status: DC
  Administered 2014-01-20 (×3): via INTRAVENOUS

## 2014-01-20 MED ORDER — KETOROLAC TROMETHAMINE 30 MG/ML IJ SOLN
INTRAMUSCULAR | Status: DC | PRN
  Administered 2014-01-20: 30 mg via INTRAVENOUS

## 2014-01-20 MED ORDER — PHENYLEPHRINE 40 MCG/ML (10ML) SYRINGE FOR IV PUSH (FOR BLOOD PRESSURE SUPPORT)
PREFILLED_SYRINGE | INTRAVENOUS | Status: AC
  Filled 2014-01-20: qty 5

## 2014-01-20 MED ORDER — ONDANSETRON HCL 4 MG/2ML IJ SOLN
INTRAMUSCULAR | Status: AC
  Filled 2014-01-20: qty 2

## 2014-01-20 MED ORDER — PROPOFOL 10 MG/ML IV EMUL
INTRAVENOUS | Status: AC
  Filled 2014-01-20: qty 40

## 2014-01-20 MED ORDER — PHENYLEPHRINE HCL 10 MG/ML IJ SOLN
INTRAMUSCULAR | Status: DC | PRN
  Administered 2014-01-20: 40 ug via INTRAVENOUS
  Administered 2014-01-20: 80 ug via INTRAVENOUS
  Administered 2014-01-20 (×8): 40 ug via INTRAVENOUS

## 2014-01-20 MED ORDER — SCOPOLAMINE 1 MG/3DAYS TD PT72
1.0000 | MEDICATED_PATCH | Freq: Once | TRANSDERMAL | Status: DC
  Administered 2014-01-20: 1.5 mg via TRANSDERMAL

## 2014-01-20 MED ORDER — FENTANYL CITRATE 0.05 MG/ML IJ SOLN
INTRAMUSCULAR | Status: AC
  Filled 2014-01-20: qty 5

## 2014-01-20 MED ORDER — LIDOCAINE HCL 2 % IJ SOLN
INTRAMUSCULAR | Status: DC | PRN
  Administered 2014-01-20: 20 mL

## 2014-01-20 MED ORDER — KETOROLAC TROMETHAMINE 30 MG/ML IJ SOLN
INTRAMUSCULAR | Status: AC
  Filled 2014-01-20: qty 1

## 2014-01-20 MED ORDER — DOXYCYCLINE HYCLATE 50 MG PO CAPS: 100.0000 mg | ORAL_CAPSULE | Freq: Two times a day (BID) | ORAL | Status: AC

## 2014-01-20 MED ORDER — ONDANSETRON HCL 4 MG/2ML IJ SOLN
INTRAMUSCULAR | Status: DC | PRN
  Administered 2014-01-20: 4 mg via INTRAVENOUS

## 2014-01-20 MED ORDER — EPHEDRINE SULFATE 50 MG/ML IJ SOLN
INTRAMUSCULAR | Status: DC | PRN
  Administered 2014-01-20: 5 mg via INTRAVENOUS
  Administered 2014-01-20: 2.5 mg via INTRAVENOUS
  Administered 2014-01-20: 5 mg via INTRAVENOUS
  Administered 2014-01-20: 2.5 mg via INTRAVENOUS
  Administered 2014-01-20 (×2): 5 mg via INTRAVENOUS

## 2014-01-20 MED ORDER — HYDROCODONE-ACETAMINOPHEN 5-325 MG PO TABS: 1.0000 | ORAL_TABLET | Freq: Four times a day (QID) | ORAL | Status: DC | PRN

## 2014-01-20 MED ORDER — LIDOCAINE HCL (CARDIAC) 20 MG/ML IV SOLN
INTRAVENOUS | Status: DC | PRN
  Administered 2014-01-20: 80 mg via INTRAVENOUS

## 2014-01-20 MED ORDER — DEXAMETHASONE SODIUM PHOSPHATE 10 MG/ML IJ SOLN
INTRAMUSCULAR | Status: AC
  Filled 2014-01-20: qty 1

## 2014-01-20 MED ORDER — SCOPOLAMINE 1 MG/3DAYS TD PT72
MEDICATED_PATCH | TRANSDERMAL | Status: AC
  Administered 2014-01-20: 1.5 mg via TRANSDERMAL
  Filled 2014-01-20: qty 1

## 2014-01-20 MED ORDER — EPHEDRINE 5 MG/ML INJ
INTRAVENOUS | Status: AC
  Filled 2014-01-20: qty 10

## 2014-01-20 MED ORDER — SODIUM CHLORIDE 0.9 % IR SOLN
Status: DC | PRN
  Administered 2014-01-20 (×2): 3000 mL

## 2014-01-20 MED ORDER — PROPOFOL 10 MG/ML IV BOLUS
INTRAVENOUS | Status: DC | PRN
  Administered 2014-01-20: 200 mg via INTRAVENOUS

## 2014-01-20 MED ORDER — GLYCOPYRROLATE 0.2 MG/ML IJ SOLN
INTRAMUSCULAR | Status: AC
Start: 2014-01-20 — End: 2014-01-20
  Filled 2014-01-20: qty 1

## 2014-01-20 MED ORDER — GLYCOPYRROLATE 0.2 MG/ML IJ SOLN
INTRAMUSCULAR | Status: DC | PRN
  Administered 2014-01-20: 0.1 mg via INTRAVENOUS

## 2014-01-20 SURGICAL SUPPLY — 18 items
BLADE INCISOR TRUC PLUS 2.9 (ABLATOR) IMPLANT
CANISTERS HI-FLOW 3000CC (CANNISTER) ×2 IMPLANT
CATH ROBINSON RED A/P 16FR (CATHETERS) ×2 IMPLANT
CLOTH BEACON ORANGE TIMEOUT ST (SAFETY) ×2 IMPLANT
CONTAINER PREFILL 10% NBF 60ML (FORM) ×4 IMPLANT
DRAPE HYSTEROSCOPY (DRAPE) ×2 IMPLANT
GLOVE BIO SURGEON STRL SZ 6.5 (GLOVE) ×2 IMPLANT
GLOVE BIOGEL PI IND STRL 7.0 (GLOVE) ×1 IMPLANT
GLOVE BIOGEL PI INDICATOR 7.0 (GLOVE) ×1
GOWN STRL REUS W/TWL LRG LVL3 (GOWN DISPOSABLE) ×4 IMPLANT
INCISOR TRUC PLUS BLADE 2.9 (ABLATOR) ×2
KIT HYSTEROSCOPY TRUCLEAR (ABLATOR) IMPLANT
MORCELLATOR RECIP TRUCLEAR 4.0 (ABLATOR) IMPLANT
PACK VAGINAL MINOR WOMEN LF (CUSTOM PROCEDURE TRAY) ×2 IMPLANT
PAD OB MATERNITY 4.3X12.25 (PERSONAL CARE ITEMS) ×2 IMPLANT
SYRINGE 20CC LL (MISCELLANEOUS) ×2 IMPLANT
TOWEL OR 17X24 6PK STRL BLUE (TOWEL DISPOSABLE) ×4 IMPLANT
WATER STERILE IRR 1000ML POUR (IV SOLUTION) ×2 IMPLANT

## 2014-01-20 NOTE — H&P (View-Only) (Signed)
Alyssa Ross is a 48 y.o. female G:0 for hysteroscopy, dilatation, curettage with removal of endometrial mass because of post menopausal bleeding.  Patient's last menses was in 2014 until June 2016 when she began spotting that was accompanied by menstrual-like symptoms.  This bleeding lasted for 3 days in which she also had mild cramping rated 3/10 on a 10 point pain scale.  She denies any post-coital bleeding, changes in bladder or bowel function.  An endometrial biopsy in June 2015 showed weakly proliferative endometrium with no atypia, hyperplasia or malignancy. A sono-hysterogram at that same time showed: uterus 11.74 x 9. 47 x 7.78 cm, endometrium 2.08 mm that contained , #4 fibroids with the largest, a right pedunculated  measuring 8. 74 cm and 2.9 cm mass consistent with a polyp, both ovaries appeared normal.  Given the findings on imaging and presenting symptoms the patient has decided to proceed with surgical management for her symptoms. Past Medical History  OB History: G: 0  GYN History: menarche: 48 YO    LMP: 2014    Contracepton: Menopausal/Abstinence;  Remote history of abnormal PAP smear-no treatment;  Last PAP smear-02/2013-normal  Medical History: Hypertension, Diabetes Mellitus, Anemia and Hypercholesterolemia  Surgical History: None Denies problems with anesthesia or history of blood transfusions  Family History:Diabetes Mellitus, Hypertension, Elevated Cholesterol and Renal Disease.  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 B-D ULTRAFINE III SHORT PEN 31G X 8 MM MISC USE WITH VICTOZA AS DIRECTED  . glimepiride (AMARYL) 4 MG tablet Take 4 mg by mouth daily with supper.  Marland Kitchen glucose blood (ONE TOUCH ULTRA TEST) test strip Use as instructed to check blood sugars 2 times per day  . ibuprofen (ADVIL,MOTRIN) 200 MG tablet Take 400 mg by mouth every 6 (six)  hours as needed for headache.  . Insulin Detemir (LEVEMIR) 100 UNIT/ML Pen Inject 28 Units into the skin at bedtime.   Elmore Guise Devices (ONE TOUCH DELICA LANCING DEV) MISC Use to check blood sugar 2 times per day  . Liraglutide (VICTOZA) 18 MG/3ML SOPN INJECT 1.8 MG SUBCUTANEOUSLY EVERY DAY  . lisinopril-hydrochlorothiazide (PRINZIDE,ZESTORETIC) 10-12.5 MG per tablet Take 1 tablet by mouth daily. Takes 1 tablet Monday, Wednesday and Friday  . Loratadine (ALAVERT PO) Take 1 tablet by mouth daily as needed (For allergies.).  Marland Kitchen metFORMIN (GLUCOPHAGE-XR) 750 MG 24 hr tablet TAKE 3 TABLETS BY MOUTH ONCE A DAY PER DOCTOR. KUMAR  . ONETOUCH DELICA LANCETS 03B MISC Use to check blood sugars 2 times per day  . rosuvastatin (CRESTOR) 20 MG tablet Take 1 tablet (20 mg total) by mouth daily.  . Vitamin D, Ergocalciferol, (DRISDOL) 50000 UNITS CAPS capsule Take 50,000 Units by mouth 2 (two) times a week.     No Known Allergies  Denies sensitivity to peanuts, shellfish, soy, latex or adhesives.   ROS:   Denies  corrective lensees, headache, vision changes, nasal congestion, dysphagia, tinnitus, dizziness, hoarseness, cough,  chest pain, shortness of breath, nausea, vomiting, diarrhea,constipation,  urinary frequency, urgency  dysuria, hematuria, vaginitis symptoms, pelvic pain, swelling of joints,easy bruising,  myalgias, arthralgias, skin rashes, unexplained weight loss and except as is mentioned in the history of present illness, patient's review of systems is otherwise negative.  Physical Exam  Bp: 112/60   P: 88   R: 18   Temperature: 97.8 degrees F orally    Weight: 293.5 lbs.  Height: 5\' 9"    BMI: 43.3  Neck: supple without masses or thyromegaly Lungs: clear to auscultation Heart: regular rate and rhythm Abdomen: soft, non-tender and no organomegaly Pelvic:EGBUS- wnl; vagina-normal rugae; uterus-irregular 12-14 weeks size cervix without lesions or motion tenderness; adnexae-no tenderness or  masses Extremities:  no clubbing, cyanosis or edema   Assesment: Post Menopausal Bleeding           Endometrial Mass           Fibroids  Disposition:  A discussion was held with patient regarding the indication for her procedure(s) along with the risks, which include but are not limited to: reaction to anesthesia, damage to adjacent organs, infection and excessive bleeding. The patient verbalized understanding of these risks and has consented to proceed with Hysteroscopic Removal of Endometrial Mass, Dilatation and Curettage at [place] on January 20, 2014.   CSN# [id number]   Daionna Crossland J. Florene Glen, PA-C  for Dr. Franklyn Lor. Dillard

## 2014-01-20 NOTE — Anesthesia Preprocedure Evaluation (Signed)
Anesthesia Evaluation  Patient identified by MRN, date of birth, ID band Patient awake    Reviewed: Allergy & Precautions, H&P , NPO status , Patient's Chart, lab work & pertinent test results, reviewed documented beta blocker date and time   Airway Mallampati: I TM Distance: >3 FB Neck ROM: full    Dental no notable dental hx. (+) Teeth Intact   Pulmonary neg pulmonary ROS,    Pulmonary exam normal       Cardiovascular hypertension,     Neuro/Psych negative neurological ROS  negative psych ROS   GI/Hepatic negative GI ROS, Neg liver ROS,   Endo/Other  negative endocrine ROSdiabetes, Type 2, Oral Hypoglycemic Agents  Renal/GU negative Renal ROS     Musculoskeletal   Abdominal Normal abdominal exam  (+)   Peds  Hematology negative hematology ROS (+)   Anesthesia Other Findings   Reproductive/Obstetrics negative OB ROS                           Anesthesia Physical Anesthesia Plan  ASA: II  Anesthesia Plan: General   Post-op Pain Management:    Induction: Intravenous  Airway Management Planned: LMA  Additional Equipment:   Intra-op Plan:   Post-operative Plan:   Informed Consent: I have reviewed the patients History and Physical, chart, labs and discussed the procedure including the risks, benefits and alternatives for the proposed anesthesia with the patient or authorized representative who has indicated his/her understanding and acceptance.     Plan Discussed with: CRNA and Surgeon  Anesthesia Plan Comments:         Anesthesia Quick Evaluation

## 2014-01-20 NOTE — Transfer of Care (Signed)
Immediate Anesthesia Transfer of Care Note  Patient: Alyssa Ross  Procedure(s) Performed: Procedure(s): DILATATION & CURETTAGE/HYSTEROSCOPY WITH TRUCLEAR (N/A) DILATATION & CURETTAGE/HYSTEROSCOPY WITH RESECTOCOPE (N/A)  Patient Location: PACU  Anesthesia Type:General  Level of Consciousness: awake, alert , oriented and patient cooperative  Airway & Oxygen Therapy: Patient Spontanous Breathing and Patient connected to nasal cannula oxygen  Post-op Assessment: Report given to PACU RN and Post -op Vital signs reviewed and stable  Post vital signs: Reviewed and stable  Complications: No apparent anesthesia complications

## 2014-01-20 NOTE — Anesthesia Postprocedure Evaluation (Signed)
  Anesthesia Post-op Note  Patient: Alyssa Ross  Procedure(s) Performed: Procedure(s): DILATATION & CURETTAGE/HYSTEROSCOPY WITH TRUCLEAR (N/A) DILATATION & CURETTAGE/HYSTEROSCOPY WITH RESECTOCOPE (N/A)  Patient Location: PACU  Anesthesia Type:General  Level of Consciousness: awake, alert  and oriented  Airway and Oxygen Therapy: Patient Spontanous Breathing  Post-op Pain: none  Post-op Assessment: Post-op Vital signs reviewed, Patient's Cardiovascular Status Stable, Respiratory Function Stable, Patent Airway, No signs of Nausea or vomiting and Pain level controlled  Post-op Vital Signs: Reviewed and stable  Last Vitals:  Filed Vitals:   01/20/14 1400  BP: 118/69  Pulse: 71  Temp:   Resp: 16    Complications: No apparent anesthesia complications

## 2014-01-20 NOTE — Discharge Instructions (Signed)
Hysteroscopy Hysteroscopy is a procedure used for looking inside the womb (uterus). It may be done for various reasons, including:  To evaluate abnormal bleeding, fibroid (benign, noncancerous) tumors, polyps, scar tissue (adhesions), and possibly cancer of the uterus.  To look for lumps (tumors) and other uterine growths.  To look for causes of why a woman cannot get pregnant (infertility), causes of recurrent loss of pregnancy (miscarriages), or a lost intrauterine device (IUD).  To perform a sterilization by blocking the fallopian tubes from inside the uterus. In this procedure, a thin, flexible tube with a tiny light and camera on the end of it (hysteroscope) is used to look inside the uterus. A hysteroscopy should be done right after a menstrual period to be sure you are not pregnant. LET [facility] CARE PROVIDER KNOW ABOUT:   Any allergies you have.  All medicines you are taking, including vitamins, herbs, eye drops, creams, and over-the-counter medicines.  Previous problems you or members of your family have had with the use of anesthetics.  Any blood disorders you have.  Previous surgeries you have had.  Medical conditions you have. RISKS AND COMPLICATIONS  Generally, this is a safe procedure. However, as with any procedure, complications can occur. Possible complications include:  Putting a hole in the uterus.  Excessive bleeding.  Infection.  Damage to the cervix.  Injury to other organs.  Allergic reaction to medicines.  Too much fluid used in the uterus for the procedure. BEFORE THE PROCEDURE   Ask your health care provider about changing or stopping any regular medicines.  Do not take aspirin or blood thinners for 1 week before the procedure, or as directed by your health care provider. These can cause bleeding.  If you smoke, do not smoke for 2 weeks before the procedure.  In some cases, a medicine is placed in the cervix the day before the procedure.  This medicine makes the cervix have a larger opening (dilate). This makes it easier for the instrument to be inserted into the uterus during the procedure.  Do not eat or drink anything for at least 8 hours before the surgery.  Arrange for someone to take you home after the procedure. PROCEDURE   You may be given a medicine to relax you (sedative). You may also be given one of the following:  A medicine that numbs the area around the cervix (local anesthetic).  A medicine that makes you sleep through the procedure (general anesthetic).  The hysteroscope is inserted through the vagina into the uterus. The camera on the hysteroscope sends a picture to a TV screen. This gives the surgeon a good view inside the uterus.  During the procedure, air or a liquid is put into the uterus, which allows the surgeon to see better.  Sometimes, tissue is gently scraped from inside the uterus. These tissue samples are sent to a lab for testing. AFTER THE PROCEDURE   If you had a general anesthetic, you may be groggy for a couple hours after the procedure.  If you had a local anesthetic, you will be able to go home as soon as you are stable and feel ready.  You may have some cramping. This normally lasts for a couple days.  You may have bleeding, which varies from light spotting for a few days to menstrual-like bleeding for 3-7 days. This is normal.  If your test results are not back during the visit, make an appointment with your health care provider to find out the  results. Document Released: 09/02/2000 Document Revised: 03/17/2013 Document Reviewed: 12/24/2012 [facility] Patient Information 2015 [place], [place]. This information is not intended to replace advice given to you by your health care provider. Make sure you discuss any questions you have with your health care provider.   DISCHARGE INSTRUCTIONS: HYSTEROSCOPY  The following instructions have been prepared to help you care for yourself upon  your return home.  Personal hygiene:  Use sanitary pads for vaginal drainage, not tampons.  Shower the day after your procedure.  NO tub baths, pools or Jacuzzis for 2-3 weeks.  Wipe front to back after using the bathroom.  Activity and limitations:  Do NOT drive or operate any equipment for 24 hours. The effects of anesthesia are still present and drowsiness may result.  Do NOT rest in bed all day.  Walking is encouraged.  Walk up and down stairs slowly.  You may resume your normal activity in one to two days or as indicated by your physician. Sexual activity: NO intercourse for at least 2 weeks after the procedure, or as indicated by your Doctor.  Diet: Eat a light meal as desired this evening. You may resume your usual diet tomorrow.  Return to Work: You may resume your work activities in one to two days or as indicated by Marine scientist.  What to expect after your surgery: Expect to have vaginal bleeding/discharge for 2-3 days and spotting for up to 10 days. It is not unusual to have soreness for up to 1-2 weeks. You may have a slight burning sensation when you urinate for the first day. Mild cramps may continue for a couple of days. You may have a regular period in 2-6 weeks.  NO IBUPROFEN PRODUCTS (MOTRIN, ADVIL) OR ALEVE UNTIL 7:00PM TODAY.   Call your doctor for any of the following:  Excessive vaginal bleeding or clotting, saturating and changing one pad every hour.  Inability to urinate 6 hours after discharge from hospital.  Pain not relieved by pain medication.  Fever of 100.4 F or greater.  Unusual vaginal discharge or odor.  Return to office _________________Call for an appointment ___________________ Patients signature: ______________________ Nurses signature ________________________  [facility] [phone]

## 2014-01-20 NOTE — Interval H&P Note (Signed)
History and Physical Interval Note:  01/20/2014 11:23 AM  Alyssa Ross  has presented today for surgery, with the diagnosis of Post Menopausal bleeding  The various methods of treatment have been discussed with the patient and family. After consideration of risks, benefits and other options for treatment, the patient has consented to  Procedure(s): [place] (N/A) as a surgical intervention .  The patient's history has been reviewed, patient examined, no change in status, stable for surgery.  I have reviewed the patient's chart and labs.  Questions were answered to the patient's satisfaction.     [facility]

## 2014-01-20 NOTE — Op Note (Addendum)
Pre op DX: Post Menopausal bleeding   Post Op PP:IRJJ with endometrial polyps and fibroid   PHYSICIAN : Sheranda Seabrooks   ASSISTANTS: none   ANESTHESIA:   general and paracervical block  ESTIMATED BLOOD LOSS: minimal  LOCAL MEDICATIONS USED:  LIDOCAINE 20CC  SPECIMEN:  Source of Specimen:  endometrial curettings  DISPOSITION OF SPECIMEN:  PATHOLOGY  COUNTS Correct:  YES    DICTATION #: The patient was taken to the operating room and prepped and draped in a normal sterile fashion. An in out catheter was used to drain the bladder.   A bivalve speculum was placed into the vagina and anterior lip of the cervix was grasped with a single-tooth tenaculum.  20 cc of 2% lidocaine was used for cervical block.  the cervix was then dilated with Kennon Rounds dilators up to 21. The hysteroscope was placed into the uterine cavity. The  entire uterus and both ostia were visualized. There was one polyp on the anterior aspect of the fundus. The other fibroid was seen on the right lateral wall just near the ostium.  True clear  And the resectoscope was used to remove the fibroid.  The 90% of the fibroid was removed.  The remainder, could not be reached because of the angle.     Hyseroscope was then removed from the uterus. A sharp curettage was then done with a curette and endometrial curettings were obtained. The endometrial curettings, fibroid and polyp pieces  were sent to pathology.  The tenaculum was removed from the cervix and hemostasis was noted.   PLAN OF CARE: discharge to home  PATIENT DISPOSITION:  PACU - hemodynamically stable.  Both glycine and NS was used during this case. The deficit was 250 cc

## 2014-01-21 ENCOUNTER — Encounter (HOSPITAL_COMMUNITY): Payer: Self-pay | Admitting: Obstetrics and Gynecology

## 2014-01-21 ENCOUNTER — Other Ambulatory Visit: Payer: Self-pay | Admitting: Endocrinology

## 2014-01-27 ENCOUNTER — Telehealth: Payer: Self-pay | Admitting: Endocrinology

## 2014-01-27 ENCOUNTER — Other Ambulatory Visit: Payer: Self-pay | Admitting: Endocrinology

## 2014-01-27 ENCOUNTER — Other Ambulatory Visit: Payer: Self-pay | Admitting: *Deleted

## 2014-01-27 MED ORDER — GLIMEPIRIDE 4 MG PO TABS: 4.0000 mg | ORAL_TABLET | Freq: Every day | ORAL | Status: DC

## 2014-01-27 NOTE — Telephone Encounter (Signed)
Patient stated that she takes Glimepiride 4 mg she take a whole pill Dr Dwyane Dee has it as a half pill.  Please advise

## 2014-02-11 ENCOUNTER — Other Ambulatory Visit (INDEPENDENT_AMBULATORY_CARE_PROVIDER_SITE_OTHER): Payer: BC Managed Care – PPO

## 2014-02-11 ENCOUNTER — Other Ambulatory Visit: Payer: BC Managed Care – PPO

## 2014-02-11 DIAGNOSIS — IMO0001 Reserved for inherently not codable concepts without codable children: Secondary | ICD-10-CM

## 2014-02-11 DIAGNOSIS — E785 Hyperlipidemia, unspecified: Secondary | ICD-10-CM

## 2014-02-11 DIAGNOSIS — E1165 Type 2 diabetes mellitus with hyperglycemia: Principal | ICD-10-CM

## 2014-02-11 LAB — BASIC METABOLIC PANEL
BUN: 17 mg/dL (ref 6–23)
CO2: 29 meq/L (ref 19–32)
Calcium: 9.6 mg/dL (ref 8.4–10.5)
Chloride: 103 mEq/L (ref 96–112)
Creatinine, Ser: 0.9 mg/dL (ref 0.4–1.2)
GFR: 88.21 mL/min (ref >60.00)
Glucose, Bld: 117 mg/dL — ABNORMAL HIGH (ref 70–99)
POTASSIUM: 3.7 meq/L (ref 3.5–5.1)
SODIUM: 139 meq/L (ref 135–145)

## 2014-02-11 LAB — LDL CHOLESTEROL, DIRECT: LDL DIRECT: 97.7 mg/dL

## 2014-02-11 LAB — HEMOGLOBIN A1C: Hgb A1c MFr Bld: 7.6 % — ABNORMAL HIGH (ref 4.6–6.5)

## 2014-02-17 ENCOUNTER — Encounter: Payer: Self-pay | Admitting: Endocrinology

## 2014-02-17 ENCOUNTER — Other Ambulatory Visit: Payer: Self-pay | Admitting: *Deleted

## 2014-02-17 ENCOUNTER — Ambulatory Visit (INDEPENDENT_AMBULATORY_CARE_PROVIDER_SITE_OTHER): Payer: BC Managed Care – PPO | Admitting: Endocrinology

## 2014-02-17 VITALS — BP 110/81 | HR 83 | Temp 97.7°F | Resp 16 | Ht 69.0 in | Wt 243.0 lb

## 2014-02-17 DIAGNOSIS — E7800 Pure hypercholesterolemia, unspecified: Secondary | ICD-10-CM

## 2014-02-17 DIAGNOSIS — IMO0001 Reserved for inherently not codable concepts without codable children: Secondary | ICD-10-CM

## 2014-02-17 DIAGNOSIS — I10 Essential (primary) hypertension: Secondary | ICD-10-CM

## 2014-02-17 DIAGNOSIS — E1165 Type 2 diabetes mellitus with hyperglycemia: Principal | ICD-10-CM

## 2014-02-17 MED ORDER — INSULIN LISPRO 100 UNIT/ML (KWIKPEN): 8.0000 [IU] | PEN_INJECTOR | Freq: Every day | SUBCUTANEOUS | Status: DC

## 2014-02-17 MED ORDER — INSULIN PEN NEEDLE 31G X 8 MM MISC: Status: DC

## 2014-02-17 NOTE — Patient Instructions (Signed)
Humalog 6 units and adjust 1-2 units up or down to keep sugar at night about 140-160  Take Levemir at supper

## 2014-02-17 NOTE — Progress Notes (Signed)
Patient ID: Alyssa Ross, female   DOB: [birth year], 48 y.o.   MRN: [id number]   Reason for Appointment: Diabetes follow-up   History of Present Illness   Diagnosis: Type 2 DIABETES MELITUS, date of diagnosis 2005   She had previously been on metformin and Amaryl Because of inadequate control she was given Victoza in addition in 2011 With this her blood sugars have been significantly better and her A1c has been as low as 6.2 in 2013 Her blood sugars have been more difficult to control since 2014 an A1c consistently over 7%  RECENT history:  She  was started on insulin with Levemir in 06/2013 because of  increase in her A1c to 8.5% She had previously been taking Victoza along with Amaryl and metformin without consistent control Also has had difficulty watching her diet especially with portions, carbohydrates and eating sweets Again had difficulty losing weight despite taking 1.8 mg Victoza, also had tried the higher dose She continues to take Amaryl in the evening She does tend to forget taking her Levemir at bedtime and then will take it during the night when she wakes up Current blood sugar patterns:   She has variability in her blood sugars in the mornings with some readings as low as 115 but as high as 181  recently has gone back to taking Amaryl in the evening and sugars may be better with this.  Not checking many readings after her evening meal but these are consistently over at least 170 A1c is trying to watch her diet A1c has been over 7% and slightly higher on this visit  INSULIN dose: 28 units Levemir at bedtime  Oral hypoglycemic drugs: Amaryl 4mg  hs and metformin 2250 mg       Side effects from medications: None  Monitors blood glucose: Once a day   Glucometer: One Touch.          Blood Glucose readings in the last 4 weeks:  Checking blood sugar mostly in the morning  PREMEAL Breakfast Lunch Dinner Bedtime Overall  Glucose range: 122-181  118   94-213   171-266     Mean/median:  145     210  145   Hypoglycemia: None      Meals: 3 meals per day. Dinner 8-9 pm    DIET: She is usually able to control portions usually but has been unable to control higher carb foods and sweets;  Physical activity: exercise: Going 3 days a week to the gym for 45 minutes,              Wt Readings from Last 3 Encounters:  02/17/14 243 lb (110.224 kg)  01/13/14 238 lb (107.956 kg)  12/17/13 245 lb 6.4 oz (111.313 kg)    LABS:  Lab Results  Component Value Date   HGBA1C 7.6* 02/11/2014   HGBA1C 7.4* 10/01/2013   HGBA1C 8.5* 07/06/2013   Lab Results  Component Value Date   MICROALBUR 1.0 10/01/2013   [place] 99 12/14/2013   CREATININE 0.9 02/11/2014    Appointment on 02/11/2014  Component Date Value Ref Range Status  . Hemoglobin A1C 02/11/2014 7.6* 4.6 - 6.5 % Final   Glycemic Control Guidelines for People with Diabetes:Non Diabetic:  <6%Goal of Therapy: <7%Additional Action Suggested:  >8%   . Direct LDL 02/11/2014 97.7   Final   Optimal:  <100 mg/dLNear or Above Optimal:  100-129 mg/dLBorderline High:  130-159 mg/dLHigh:  160-189 mg/dLVery High:  >190 mg/dL  . Sodium 02/11/2014 139  135 - 145 mEq/L Final  . Potassium 02/11/2014 3.7  3.5 - 5.1 mEq/L Final  . Chloride 02/11/2014 103  96 - 112 mEq/L Final  . CO2 02/11/2014 29  19 - 32 mEq/L Final  . Glucose, Bld 02/11/2014 117* 70 - 99 mg/dL Final  . BUN 02/11/2014 17  6 - 23 mg/dL Final  . Creatinine, Ser 02/11/2014 0.9  0.4 - 1.2 mg/dL Final  . Calcium 02/11/2014 9.6  8.4 - 10.5 mg/dL Final  . GFR 02/11/2014 88.21  >60.00 mL/min Final      Medication List       This list is accurate as of: 02/17/14  1:37 PM.  Always use your most recent med list.               ALAVERT PO  Take 1 tablet by mouth daily as needed (For allergies.).     B-D ULTRAFINE III SHORT PEN 31G X 8 MM Misc  Generic drug:  Insulin Pen Needle  USE WITH VICTOZA AS DIRECTED     glimepiride 4 MG tablet  Commonly known as:  AMARYL   Take 1 tablet (4 mg total) by mouth daily with supper.     glucose blood test strip  Commonly known as:  ONE TOUCH ULTRA TEST  Use as instructed to check blood sugars 2 times per day     Insulin Detemir 100 UNIT/ML Pen  Commonly known as:  LEVEMIR  Inject 28 Units into the skin at bedtime.     lisinopril-hydrochlorothiazide 10-12.5 MG per tablet  Commonly known as:  PRINZIDE,ZESTORETIC  Take 1 tablet by mouth daily. Takes 1 tablet Monday, Wednesday and Friday     metFORMIN 750 MG 24 hr tablet  Commonly known as:  GLUCOPHAGE-XR  TAKE 3 TABLETS BY MOUTH EVERY DAY     ONE TOUCH DELICA LANCING DEV Misc  Use to check blood sugar 2 times per day     ONETOUCH DELICA LANCETS 16X Misc  Use to check blood sugars 2 times per day     rosuvastatin 20 MG tablet  Commonly known as:  CRESTOR  Take 1 tablet (20 mg total) by mouth daily.     VICTOZA 18 MG/3ML Sopn  Generic drug:  Liraglutide  INJECT 1.8 MG SUBCUTANEOUSLY EVERY DAY     VICTOZA 18 MG/3ML Sopn  Generic drug:  Liraglutide  INJECT 1.8 MG SUBCUTANEOUSLY EVERY DAY     Vitamin D (Ergocalciferol) 50000 UNITS Caps capsule  Commonly known as:  DRISDOL  Take 50,000 Units by mouth 2 (two) times a week.        Allergies: No Known Allergies  Past Medical History  Diagnosis Date  . 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 ASCUS (atypical squamous cells of undetermined significance) on Pap smear 08/06/1999  . Breast mass in female 2002    Left  . Yeast vaginitis 2006  . Fibroid uterus 2010  . Irregular bleeding 2011  . Diabetes mellitus without complication   . Hypertension     Past Surgical History  Procedure Laterality Date  . No past surgeries    . Dilatation & curettage/hysteroscopy with trueclear N/A 01/20/2014    Procedure: DILATATION & CURETTAGE/HYSTEROSCOPY WITH TRUCLEAR;  Surgeon: [name], MD;  Location: [facility];  Service: Gynecology;  Laterality: N/A;  . Dilatation &  currettage/hysteroscopy with resectocope N/A 01/20/2014    Procedure: DILATATION & CURETTAGE/HYSTEROSCOPY WITH RESECTOCOPE;  Surgeon: [name], MD;  Location: [facility];  Service:  Gynecology;  Laterality: N/A;    Family History  Problem Relation Age of Onset  . Diabetes Mother   . Diabetes Father   . Mental retardation Sister     Social History:  reports that she has never smoked. She has never used smokeless tobacco. She reports that she drinks alcohol. She reports that she does not use illicit drugs.  REVIEW of systems:  She is currently being followed by her PCP for hypertension. Blood pressure is well controlled with Zestoretic  Has had hypercholesterolemia, taking Crestor with good control, mild increase in triglycerides also  Lab Results  Component Value Date   CHOL 197 12/14/2013   HDL 50.20 12/14/2013   LDLCALC 99 12/14/2013   LDLDIRECT 97.7 02/11/2014   TRIG 239.0* 12/14/2013   CHOLHDL 4 12/14/2013      Examination:   BP 110/81  Pulse 83  Temp(Src) 97.7 F (36.5 C)  Resp 16  Ht 5\' 9"  (1.753 m)  Wt 243 lb (110.224 kg)  BMI 35.87 kg/m2  SpO2 97%  Body mass index is 35.87 kg/(m^2).   No ankle edema  Assessment/Plan:   Diabetes type 2, uncontrolled  The patient's diabetes control needs to be improved as discussed in history of present illness Her A1c is still not at her goal of 7% Most of her high readings are related to inconsistent evening meals; however still has significant postprandial hyperglycemia whenever she is monitoring after supper This is despite taking Amaryl and Victoza Has difficulty losing weight even though she is trying to exercise Discussed starting mealtime coverage at suppertime with HUMALOG, discussed how this works as well as timing Patient information brochures given Discussed postprandial targets and adjustment of the evening meals to get her blood sugar in target; she may adjust it based on her carbohydrate intake Also since she is  inconsistent with her Levemir at night she can try doing this at supper time also; most likely will need a lower dose if morning sugars come down May leave off her Amaryl when she finishes her supply Discussed V go pump but she is reluctant to do this  Hyperlipidemia: LDL is under 100 and with the higher dose of Crestor now  Hypertension: Blood pressure is  controlled  Patient Instructions  Humalog 6 units and adjust 1-2 units up or down to keep sugar at night about 140-160  Take Levemir at supper    Counseling time over 50% of today's 25 minute visit   Alyssa Ross 02/17/2014, 1:37 PM

## 2014-02-19 ENCOUNTER — Other Ambulatory Visit: Payer: Self-pay | Admitting: Endocrinology

## 2014-02-19 DIAGNOSIS — E1165 Type 2 diabetes mellitus with hyperglycemia: Principal | ICD-10-CM

## 2014-02-19 DIAGNOSIS — IMO0001 Reserved for inherently not codable concepts without codable children: Secondary | ICD-10-CM

## 2014-02-20 ENCOUNTER — Other Ambulatory Visit: Payer: Self-pay | Admitting: Endocrinology

## 2014-03-30 ENCOUNTER — Other Ambulatory Visit: Payer: Self-pay | Admitting: Endocrinology

## 2014-03-31 ENCOUNTER — Encounter: Payer: Self-pay | Admitting: Endocrinology

## 2014-03-31 ENCOUNTER — Ambulatory Visit (INDEPENDENT_AMBULATORY_CARE_PROVIDER_SITE_OTHER): Payer: BC Managed Care – PPO | Admitting: Endocrinology

## 2014-03-31 VITALS — BP 130/82 | HR 94 | Temp 97.9°F | Resp 14 | Ht 69.0 in | Wt 246.4 lb

## 2014-03-31 DIAGNOSIS — I10 Essential (primary) hypertension: Secondary | ICD-10-CM

## 2014-03-31 DIAGNOSIS — E1165 Type 2 diabetes mellitus with hyperglycemia: Secondary | ICD-10-CM

## 2014-03-31 DIAGNOSIS — IMO0002 Reserved for concepts with insufficient information to code with codable children: Secondary | ICD-10-CM

## 2014-03-31 NOTE — Patient Instructions (Signed)
Levemir 28 units  Walk daily  Humalog 5-7 units at dinner and large lunches

## 2014-03-31 NOTE — Progress Notes (Signed)
Patient ID: Alyssa Ross, female   DOB: 1965-09-30, 48 y.o.   MRN: [id number]   Reason for Appointment: Diabetes follow-up   History of Present Illness   Diagnosis: Type 2 DIABETES MELITUS, date of diagnosis 2005   She had previously been on metformin and Amaryl Because of inadequate control she was given Victoza in addition in 2011 With this her blood sugars have been significantly better and her A1c has been as low as 6.2 in 2013 Her blood sugars have been more difficult to control since 2014 an A1c consistently over 7%  RECENT history:  She  was started on insulin with Levemir in 06/2013 because of  increase in her A1c to 8.5% She had previously been taking Victoza along with Amaryl and metformin without consistent control She usually has difficulty watching her diet especially with portions, carbohydrates and eating sweets Also has difficulty losing weight despite taking 1.8 mg Victoza, also had tried the 3 mg dose Because of significant high POSTPRANDIAL readings after dinner she was started on Humalog insulin at suppertime in 9/15 Amaryl was stopped  Although she was instructed on the timing of insulin she was apparently taking it randomly after eating and has started taking this before supper since she saw her PCP recently She is still not consistent with her diet and weight has gone up 3 pounds  Current blood sugar patterns:   She has several blood sugars below 120 after her evening meal  Has only one relatively high reading of 275 after dinner  Fasting blood sugars are somewhat variable but somewhat better than on our last visit; this is despite her reducing Levemir by 2 units on her own  She has 2 readings after lunch, 1 was high.   overall median is slightly better compared to last time A1c has been over 7% and slightly higher on this visit  INSULIN dose: 26 units Levemir at bedtime, Humalog 6-8 at supper Oral hypoglycemic drugs: Amaryl 4mg  hs and metformin 2250 mg        Side effects from medications: None  Monitors blood glucose: Once a day   Glucometer: One Touch.          Blood Glucose readings in the last 2 weeks:  Checking blood sugar mostly in the morning  PREMEAL Breakfast Lunch  4:30 PM  Bedtime Overall  Glucose range:  97-188  130, 141   129, 315   114-275    Mean/median:  140     130   136    Hypoglycemia: None      Meals: 3 meals per day. Dinner 8-9 pm    DIET: She is usually able to control portions usually but has been unable to consistently control higher carb foods and sweets;  Physical activity: exercise: Going upto 3 days a week to the gym for 45 minutes,              Wt Readings from Last 3 Encounters:  03/31/14 246 lb 6.4 oz (111.766 kg)  02/17/14 243 lb (110.224 kg)  01/13/14 238 lb (107.956 kg)    LABS:  Lab Results  Component Value Date   HGBA1C 7.6* 02/11/2014   HGBA1C 7.4* 10/01/2013   HGBA1C 8.5* 07/06/2013   Lab Results  Component Value Date   MICROALBUR 1.0 10/01/2013   [place] 99 12/14/2013   CREATININE 0.9 02/11/2014    No visits with results within 1 Week(s) from this visit. Latest known visit with results is:  Appointment on 02/11/2014  Component Date Value Ref Range Status  . Hemoglobin A1C 02/11/2014 7.6* 4.6 - 6.5 % Final   Glycemic Control Guidelines for People with Diabetes:Non Diabetic:  <6%Goal of Therapy: <7%Additional Action Suggested:  >8%   . Direct LDL 02/11/2014 97.7   Final   Optimal:  <100 mg/dLNear or Above Optimal:  100-129 mg/dLBorderline High:  130-159 mg/dLHigh:  160-189 mg/dLVery High:  >190 mg/dL  . Sodium 02/11/2014 139  135 - 145 mEq/L Final  . Potassium 02/11/2014 3.7  3.5 - 5.1 mEq/L Final  . Chloride 02/11/2014 103  96 - 112 mEq/L Final  . CO2 02/11/2014 29  19 - 32 mEq/L Final  . Glucose, Bld 02/11/2014 117* 70 - 99 mg/dL Final  . BUN 02/11/2014 17  6 - 23 mg/dL Final  . Creatinine, Ser 02/11/2014 0.9  0.4 - 1.2 mg/dL Final  . Calcium 02/11/2014 9.6  8.4 - 10.5 mg/dL Final  . GFR  02/11/2014 88.21  >60.00 mL/min Final      Medication List       This list is accurate as of: 03/31/14  8:59 PM.  Always use your most recent med list.               ALAVERT PO  Take 1 tablet by mouth daily as needed (For allergies.).     glucose blood test strip  Commonly known as:  ONE TOUCH ULTRA TEST  Use as instructed to check blood sugars 2 times per day     Insulin Detemir 100 UNIT/ML Pen  Commonly known as:  LEVEMIR  Inject 26 Units into the skin at bedtime.     insulin lispro 100 UNIT/ML KiwkPen  Commonly known as:  HUMALOG KWIKPEN  Inject 0.08 mLs (8 Units total) into the skin daily before supper.     Insulin Pen Needle 31G X 8 MM Misc  Commonly known as:  B-D ULTRAFINE III SHORT PEN  Use three needles per day     B-D ULTRAFINE III SHORT PEN 31G X 8 MM Misc  Generic drug:  Insulin Pen Needle  USE WITH VICTOZA AS DIRECTED     lisinopril-hydrochlorothiazide 10-12.5 MG per tablet  Commonly known as:  PRINZIDE,ZESTORETIC  Take 1 tablet by mouth daily. Takes 1 tablet Monday, Wednesday and Friday     metFORMIN 750 MG 24 hr tablet  Commonly known as:  GLUCOPHAGE-XR  TAKE 3 TABLETS BY MOUTH EVERY DAY     ONE TOUCH DELICA LANCING DEV Misc  Use to check blood sugar 2 times per day     ONETOUCH DELICA LANCETS 93X Misc  Use to check blood sugars 2 times per day     rosuvastatin 20 MG tablet  Commonly known as:  CRESTOR  Take 1 tablet (20 mg total) by mouth daily.     VICTOZA 18 MG/3ML Sopn  Generic drug:  Liraglutide  INJECT 1.8 MG SUBCUTANEOUSLY EVERY DAY     VICTOZA 18 MG/3ML Sopn  Generic drug:  Liraglutide  INJECT 1.8 MG SUBCUTANEOUSLY EVERY DAY     Vitamin D (Ergocalciferol) 50000 UNITS Caps capsule  Commonly known as:  DRISDOL  Take 50,000 Units by mouth 2 (two) times a week.        Allergies: No Known Allergies  Past Medical History  Diagnosis Date  . 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 ASCUS (atypical  squamous cells of undetermined significance) on Pap smear 08/06/1999  . Breast mass in female 2002    Left  .  Yeast vaginitis 2006  . Fibroid uterus 2010  . Irregular bleeding 2011  . Diabetes mellitus without complication   . Hypertension     Past Surgical History  Procedure Laterality Date  . No past surgeries    . Dilatation & curettage/hysteroscopy with trueclear N/A 01/20/2014    Procedure: DILATATION & CURETTAGE/HYSTEROSCOPY WITH TRUCLEAR;  Surgeon: [name], MD;  Location: [facility];  Service: Gynecology;  Laterality: N/A;  . Dilatation & currettage/hysteroscopy with resectocope N/A 01/20/2014    Procedure: DILATATION & CURETTAGE/HYSTEROSCOPY WITH RESECTOCOPE;  Surgeon: [name], MD;  Location: [facility];  Service: Gynecology;  Laterality: N/A;    Family History  Problem Relation Age of Onset  . Diabetes Mother   . Diabetes Father   . Mental retardation Sister     Social History:  reports that she has never smoked. She has never used smokeless tobacco. She reports that she drinks alcohol. She reports that she does not use illicit drugs.  REVIEW of systems:  She is currently being followed by her PCP for hypertension. Blood pressure is well controlled with Zestoretic  Has had hypercholesterolemia, taking Crestor with good control, mild increase in triglycerides also  Lab Results  Component Value Date   CHOL 197 12/14/2013   HDL 50.20 12/14/2013   LDLCALC 99 12/14/2013   LDLDIRECT 97.7 02/11/2014   TRIG 239.0* 12/14/2013   CHOLHDL 4 12/14/2013      Examination:   BP 130/82  Pulse 94  Temp(Src) 97.9 F (36.6 C)  Resp 14  Ht 5\' 9"  (1.753 m)  Wt 246 lb 6.4 oz (111.766 kg)  BMI 36.37 kg/m2  SpO2 97%  Body mass index is 36.37 kg/(m^2).   No leg edema  Assessment/Plan:   Diabetes type 2, uncontrolled  She appears to have mostly good readings after her evening meal with adding only small doses of Humalog However her diet is still inconsistent and she has gained  a little weight Highest blood sugar is 315 after lunch and discussed that she may need to cover her lunch with Humalog also if she is eating a large meal Today reviewed the actions of Humalog and Levemir and the timing of these insulins and duration of action Discussed needing to adjust her suppertime Humalog based on meal size and supper postprandial readings are low normal she can reduce the dose by 1-2 units She will also go back to 28 Levemir since fasting readings are mostly high and increase overnight Encouraged her to be more consistent with exercise  We'll need to repeat A1c on the next visit  Hypertension: Blood pressure is  controlled  Patient Instructions  Levemir 28 units  Walk daily  Humalog 5-7 units at dinner and large lunches   Counseling time over 50% of today's 25 minute visit   Esker Dever 03/31/2014, 8:59 PM

## 2014-04-27 ENCOUNTER — Other Ambulatory Visit: Payer: Self-pay | Admitting: Endocrinology

## 2014-04-29 ENCOUNTER — Other Ambulatory Visit: Payer: Self-pay | Admitting: Endocrinology

## 2014-05-29 ENCOUNTER — Other Ambulatory Visit: Payer: Self-pay | Admitting: Endocrinology

## 2014-05-31 ENCOUNTER — Other Ambulatory Visit (INDEPENDENT_AMBULATORY_CARE_PROVIDER_SITE_OTHER): Payer: BC Managed Care – PPO

## 2014-05-31 DIAGNOSIS — E1165 Type 2 diabetes mellitus with hyperglycemia: Secondary | ICD-10-CM

## 2014-05-31 DIAGNOSIS — IMO0002 Reserved for concepts with insufficient information to code with codable children: Secondary | ICD-10-CM

## 2014-05-31 LAB — COMPREHENSIVE METABOLIC PANEL
ALBUMIN: 3.7 g/dL (ref 3.5–5.2)
ALT: 14 U/L (ref 0–35)
AST: 11 U/L (ref 0–37)
Alkaline Phosphatase: 64 U/L (ref 39–117)
BUN: 19 mg/dL (ref 6–23)
CO2: 30 mEq/L (ref 19–32)
Calcium: 9.3 mg/dL (ref 8.4–10.5)
Chloride: 98 mEq/L (ref 96–112)
Creatinine, Ser: 0.9 mg/dL (ref 0.4–1.2)
GFR: 86.96 mL/min (ref >60.00)
GLUCOSE: 211 mg/dL — AB (ref 70–99)
POTASSIUM: 3.4 meq/L — AB (ref 3.5–5.1)
SODIUM: 135 meq/L (ref 135–145)
TOTAL PROTEIN: 7.4 g/dL (ref 6.0–8.3)
Total Bilirubin: 0.2 mg/dL (ref 0.2–1.2)

## 2014-05-31 LAB — HEMOGLOBIN A1C: Hgb A1c MFr Bld: 9.4 % — ABNORMAL HIGH (ref 4.6–6.5)

## 2014-06-07 ENCOUNTER — Encounter: Payer: Self-pay | Admitting: Endocrinology

## 2014-06-07 ENCOUNTER — Ambulatory Visit (INDEPENDENT_AMBULATORY_CARE_PROVIDER_SITE_OTHER): Payer: BC Managed Care – PPO | Admitting: Endocrinology

## 2014-06-07 VITALS — BP 122/82 | HR 77 | Temp 98.0°F | Resp 16 | Ht 69.0 in | Wt 239.4 lb

## 2014-06-07 DIAGNOSIS — IMO0002 Reserved for concepts with insufficient information to code with codable children: Secondary | ICD-10-CM

## 2014-06-07 DIAGNOSIS — E1165 Type 2 diabetes mellitus with hyperglycemia: Secondary | ICD-10-CM

## 2014-06-07 DIAGNOSIS — E876 Hypokalemia: Secondary | ICD-10-CM

## 2014-06-07 DIAGNOSIS — I10 Essential (primary) hypertension: Secondary | ICD-10-CM

## 2014-06-07 MED ORDER — INSULIN GLARGINE 300 UNIT/ML ~~LOC~~ SOPN: 40.0000 [IU] | PEN_INJECTOR | Freq: Every day | SUBCUTANEOUS | Status: DC

## 2014-06-07 MED ORDER — POTASSIUM CHLORIDE CRYS ER 20 MEQ PO TBCR: 20.0000 meq | EXTENDED_RELEASE_TABLET | Freq: Every day | ORAL | Status: DC

## 2014-06-07 NOTE — Progress Notes (Signed)
Patient ID: Alyssa Ross, female   DOB: 1965/06/12, 48 y.o.   MRN: [id number]   Reason for Appointment: Diabetes follow-up   History of Present Illness   Diagnosis: Type 2 DIABETES MELITUS, date of diagnosis 2005   She had previously been on metformin and Amaryl Because of inadequate control she was given Victoza in addition in 2011 With this her blood sugars have been significantly better and her A1c has been as low as 6.2 in 2013 Her blood sugars have been more difficult to control since 2014 an A1c consistently over 7% She  was started on insulin with Levemir in 06/2013 because of  increase in her A1c to 8.5% She had previously been taking Victoza along with Amaryl and metformin without consistent control Also has difficulty losing weight despite taking 1.8 mg Victoza, also had tried the 3 mg dose  RECENT history:  Her blood sugars are now much higher than usual with A1c 9.4% This is despite her continuing to take her Levemir which is now 30 units as also Humalog with her main meal. She still thinks that Victoza helps portion control She usually has difficulty watching her diet especially with carbohydrates and eating sweets.  She thinks she continues to make bad choices Also checking blood sugars somewhat infrequently and mostly in the mornings before her first meal Current blood sugar patterns:   She has higher readings throughout the day compared to her previous visit including in the morning  Has done only 2 readings after dinner recently and was somewhat better last night  Blood sugars are modestly high after lunch when she does them  INSULIN dose: 30 units Levemir at bedtime, Humalog 6-8 at supper Oral hypoglycemic drugs: Amaryl 4mg  hs and metformin 2250 mg       Side effects from medications: None  Monitors blood glucose: Once a day   Glucometer: One Touch.          Blood Glucose readings by download of meter in the last 2 weeks:    PRE-MEAL Breakfast PC Lunch pc Dinner  Bedtime Overall  Glucose range: 146-219 185-197 161, 231    Mean/median: 175    183   Hypoglycemia: None      Meals: 3 meals per day. Dinner 8-9 pm    DIET: She is usually able to control portions usually but has been unable to consistently control higher carb foods and sweets;  Physical activity: exercise: Going upto 4 days a week to the gym for 45 minutes              Wt Readings from Last 3 Encounters:  06/07/14 239 lb 6.4 oz (108.591 kg)  03/31/14 246 lb 6.4 oz (111.766 kg)  02/17/14 243 lb (110.224 kg)    LABS:  Lab Results  Component Value Date   HGBA1C 9.4* 05/31/2014   HGBA1C 7.6* 02/11/2014   HGBA1C 7.4* 10/01/2013   Lab Results  Component Value Date   MICROALBUR 1.0 10/01/2013   LDLCALC 99 12/14/2013   CREATININE 0.9 05/31/2014    No visits with results within 1 Week(s) from this visit. Latest known visit with results is:  Lab on 05/31/2014  Component Date Value Ref Range Status  . Hgb A1c MFr Bld 05/31/2014 9.4* 4.6 - 6.5 % Final   Glycemic Control Guidelines for People with Diabetes:Non Diabetic:  <6%Goal of Therapy: <7%Additional Action Suggested:  >8%   . Sodium 05/31/2014 135  135 - 145 mEq/L Final  . Potassium 05/31/2014 3.4* 3.5 -  5.1 mEq/L Final  . Chloride 05/31/2014 98  96 - 112 mEq/L Final  . CO2 05/31/2014 30  19 - 32 mEq/L Final  . Glucose, Bld 05/31/2014 211* 70 - 99 mg/dL Final  . BUN 05/31/2014 19  6 - 23 mg/dL Final  . Creatinine, Ser 05/31/2014 0.9  0.4 - 1.2 mg/dL Final  . Total Bilirubin 05/31/2014 0.2  0.2 - 1.2 mg/dL Final  . Alkaline Phosphatase 05/31/2014 64  39 - 117 U/L Final  . AST 05/31/2014 11  0 - 37 U/L Final  . ALT 05/31/2014 14  0 - 35 U/L Final  . Total Protein 05/31/2014 7.4  6.0 - 8.3 g/dL Final  . Albumin 05/31/2014 3.7  3.5 - 5.2 g/dL Final  . Calcium 05/31/2014 9.3  8.4 - 10.5 mg/dL Final  . GFR 05/31/2014 86.96  >60.00 mL/min Final      Medication List       This list is accurate as of: 06/07/14 12:03 PM.   Always use your most recent med list.               ALAVERT PO  Take 1 tablet by mouth daily as needed (For allergies.).     amoxicillin 500 MG tablet  Commonly known as:  AMOXIL  Take 500 mg by mouth 3 (three) times daily.     CRESTOR 20 MG tablet  Generic drug:  rosuvastatin  TAKE 1 TABLET BY MOUTH DAILY     glucose blood test strip  Commonly known as:  ONE TOUCH ULTRA TEST  Use as instructed to check blood sugars 2 times per day     Insulin Detemir 100 UNIT/ML Pen  Commonly known as:  LEVEMIR  Inject 30 Units into the skin at bedtime.     Insulin Glargine 300 UNIT/ML Sopn  Commonly known as:  TOUJEO SOLOSTAR  Inject 40 Units into the skin daily.     insulin lispro 100 UNIT/ML KiwkPen  Commonly known as:  HUMALOG KWIKPEN  Inject 0.08 mLs (8 Units total) into the skin daily before supper.     Insulin Pen Needle 31G X 8 MM Misc  Commonly known as:  B-D ULTRAFINE III SHORT PEN  Use three needles per day     B-D ULTRAFINE III SHORT PEN 31G X 8 MM Misc  Generic drug:  Insulin Pen Needle  USE WITH VICTOZA AS DIRECTED     lisinopril-hydrochlorothiazide 10-12.5 MG per tablet  Commonly known as:  PRINZIDE,ZESTORETIC  Take 1 tablet by mouth daily. Takes 1 tablet Monday, Wednesday and Friday     metFORMIN 750 MG 24 hr tablet  Commonly known as:  GLUCOPHAGE-XR  TAKE 3 TABLETS BY MOUTH EVERY DAY     ONE TOUCH DELICA LANCING DEV Misc  Use to check blood sugar 2 times per day     ONETOUCH DELICA LANCETS 84Z Misc  Use to check blood sugars 2 times per day     potassium chloride SA 20 MEQ tablet  Commonly known as:  K-DUR,KLOR-CON  Take 1 tablet (20 mEq total) by mouth daily.     VICTOZA 18 MG/3ML Sopn  Generic drug:  Liraglutide  INJECT 1.8 MG SUBCUTANEOUSLY EVERY DAY     Vitamin D (Ergocalciferol) 50000 UNITS Caps capsule  Commonly known as:  DRISDOL  Take 50,000 Units by mouth 2 (two) times a week.        Allergies: No Known Allergies  Past Medical History   Diagnosis Date  . 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 ASCUS (atypical squamous cells of undetermined significance) on Pap smear 08/06/1999  . Breast mass in female 2002    Left  . Yeast vaginitis 2006  . Fibroid uterus 2010  . Irregular bleeding 2011  . Diabetes mellitus without complication   . Hypertension     Past Surgical History  Procedure Laterality Date  . No past surgeries    . Dilatation & curettage/hysteroscopy with trueclear N/A 01/20/2014    Procedure: DILATATION & CURETTAGE/HYSTEROSCOPY WITH TRUCLEAR;  Surgeon: [name], MD;  Location: [facility];  Service: Gynecology;  Laterality: N/A;  . Dilatation & currettage/hysteroscopy with resectocope N/A 01/20/2014    Procedure: DILATATION & CURETTAGE/HYSTEROSCOPY WITH RESECTOCOPE;  Surgeon: [name], MD;  Location: [facility];  Service: Gynecology;  Laterality: N/A;    Family History  Problem Relation Age of Onset  . Diabetes Mother   . Diabetes Father   . Mental retardation Sister     Social History:  reports that she has never smoked. She has never used smokeless tobacco. She reports that she drinks alcohol. She reports that she does not use illicit drugs.  REVIEW of systems:  She is currently being followed by her PCP for hypertension. Blood pressure is well controlled with Zestoretic  She has had hypokalemia previously and potassium is low again.  Not taking any supplements  Has had hypercholesterolemia, taking Crestor with good control, has mild increase in triglycerides also  Lab Results  Component Value Date   CHOL 197 12/14/2013   HDL 50.20 12/14/2013   LDLCALC 99 12/14/2013   LDLDIRECT 97.7 02/11/2014   TRIG 239.0* 12/14/2013   CHOLHDL 4 12/14/2013      Examination:   BP 122/82 mmHg  Pulse 77  Temp(Src) 98 F (36.7 C)  Resp 16  Ht 5\' 9"  (1.753 m)  Wt 239 lb 6.4 oz (108.591 kg)  BMI 35.34 kg/m2  SpO2 99%  Body mass index is 35.34 kg/(m^2).   No pedal  edema  Assessment/Plan:   Diabetes type 2, uncontrolled  She has poor control with A1c over 9% which is unusual for her This is despite having taken her insulin consistently including at mealtimes She also is doing fairly well with taking Victoza consistently which controls portions She again states that her main problem is eating the wrong type of food but not portions Most likely has some progression of her diabetes since fasting readings are consistently high now  Discussed with the patient various options for improving her management  Discussed V-go pump and showed her how this works  Discussed weight loss medications such as Qsymia or Belviq and possible benefits, use for long-term control and possible side effects  Her other option would be to continue increasing her insulin for improved control  She may do better with Toujeo for better 24 hour action and we can try this until her next visit  The patient does not want to take any actions until her follow-up visit in 6 weeks and wants to try improving her diet Also agrees to see the dietitian  Hypertension: Blood pressure is  Controlled  Hypokalemia: Start potassium supplements  Patient Instructions  Get am sugar <140, now go to 34 Levemir  More testing!   Counseling time over 50% of today's 25 minute visit   Sahib Pella 06/07/2014, 12:03 PM

## 2014-06-07 NOTE — Patient Instructions (Addendum)
Get am sugar <140, now go to 34 Levemir  More testing!

## 2014-06-24 ENCOUNTER — Telehealth: Payer: Self-pay | Admitting: Oncology

## 2014-06-24 NOTE — Telephone Encounter (Signed)
S/W PATIENT AND GAVE NP APPT FOR 02/05 @ 10:30 W/DR. SHADAD.  REFERRING DR. [place]

## 2014-07-04 ENCOUNTER — Telehealth: Payer: Self-pay | Admitting: Endocrinology

## 2014-07-04 NOTE — Telephone Encounter (Signed)
Pancreatitis will need to be diagnosed by her PCP, this is not an endocrinology problem

## 2014-07-04 NOTE — Telephone Encounter (Signed)
Will discuss with her on next visit

## 2014-07-04 NOTE — Telephone Encounter (Signed)
Please see below and advise.

## 2014-07-04 NOTE — Telephone Encounter (Signed)
Noted, patient is aware, she said she will not take the victoza anymore because she says that's what is causing her problems.

## 2014-07-04 NOTE — Telephone Encounter (Signed)
Pt calling she thinks she has pancreatitis. Her platelets are high her PCP saw that. She wants to see if we can see her to access for pancreatitis. Pt is not going to take the med you are rx today because she thinks it is causing her to have pancreatitis. She states she has self diagnosed herself because she keeps getting sick to her stomach.

## 2014-07-08 ENCOUNTER — Other Ambulatory Visit: Payer: Self-pay | Admitting: Oncology

## 2014-07-08 DIAGNOSIS — D696 Thrombocytopenia, unspecified: Secondary | ICD-10-CM

## 2014-07-15 ENCOUNTER — Other Ambulatory Visit (HOSPITAL_BASED_OUTPATIENT_CLINIC_OR_DEPARTMENT_OTHER): Payer: BLUE CROSS/BLUE SHIELD

## 2014-07-15 ENCOUNTER — Ambulatory Visit: Payer: BLUE CROSS/BLUE SHIELD

## 2014-07-15 ENCOUNTER — Ambulatory Visit (HOSPITAL_BASED_OUTPATIENT_CLINIC_OR_DEPARTMENT_OTHER): Payer: BLUE CROSS/BLUE SHIELD | Admitting: Oncology

## 2014-07-15 ENCOUNTER — Encounter (INDEPENDENT_AMBULATORY_CARE_PROVIDER_SITE_OTHER): Payer: Self-pay

## 2014-07-15 ENCOUNTER — Encounter: Payer: Self-pay | Admitting: Oncology

## 2014-07-15 VITALS — BP 134/79 | HR 69 | Temp 98.0°F | Resp 18 | Ht 69.0 in | Wt 245.7 lb

## 2014-07-15 DIAGNOSIS — D75839 Thrombocytosis, unspecified: Secondary | ICD-10-CM

## 2014-07-15 DIAGNOSIS — D473 Essential (hemorrhagic) thrombocythemia: Secondary | ICD-10-CM

## 2014-07-15 DIAGNOSIS — D509 Iron deficiency anemia, unspecified: Secondary | ICD-10-CM

## 2014-07-15 DIAGNOSIS — D696 Thrombocytopenia, unspecified: Secondary | ICD-10-CM

## 2014-07-15 LAB — COMPREHENSIVE METABOLIC PANEL (CC13)
ALK PHOS: 62 U/L (ref 40–150)
ALT: 15 U/L (ref 0–55)
AST: 16 U/L (ref 5–34)
Albumin: 3.7 g/dL (ref 3.5–5.0)
Anion Gap: 9 mEq/L (ref 3–11)
BUN: 16.6 mg/dL (ref 7.0–26.0)
CHLORIDE: 105 meq/L (ref 98–109)
CO2: 27 mEq/L (ref 22–29)
Calcium: 9.2 mg/dL (ref 8.4–10.4)
Creatinine: 0.9 mg/dL (ref 0.6–1.1)
EGFR: 85 mL/min/{1.73_m2} — ABNORMAL LOW (ref >90)
Glucose: 169 mg/dl — ABNORMAL HIGH (ref 70–140)
POTASSIUM: 3.9 meq/L (ref 3.5–5.1)
Sodium: 141 mEq/L (ref 136–145)
Total Bilirubin: 0.42 mg/dL (ref 0.20–1.20)
Total Protein: 7.4 g/dL (ref 6.4–8.3)

## 2014-07-15 LAB — CBC WITH DIFFERENTIAL/PLATELET
BASO%: 0.4 % (ref 0.0–2.0)
Basophils Absolute: 0 10*3/uL (ref 0.0–0.1)
EOS%: 3.4 % (ref 0.0–7.0)
Eosinophils Absolute: 0.2 10*3/uL (ref 0.0–0.5)
HCT: 35.8 % (ref 34.8–46.6)
HGB: 11.6 g/dL (ref 11.6–15.9)
LYMPH%: 44.7 % (ref 14.0–49.7)
MCH: 28.7 pg (ref 25.1–34.0)
MCHC: 32.4 g/dL (ref 31.5–36.0)
MCV: 88.6 fL (ref 79.5–101.0)
MONO#: 0.3 10*3/uL (ref 0.1–0.9)
MONO%: 6.4 % (ref 0.0–14.0)
NEUT#: 2.4 10*3/uL (ref 1.5–6.5)
NEUT%: 45.1 % (ref 38.4–76.8)
Platelets: 374 10*3/uL (ref 145–400)
RBC: 4.04 10*6/uL (ref 3.70–5.45)
RDW: 12.9 % (ref 11.2–14.5)
WBC: 5.3 10*3/uL (ref 3.9–10.3)
lymph#: 2.4 10*3/uL (ref 0.9–3.3)

## 2014-07-15 LAB — CHCC SMEAR

## 2014-07-15 NOTE — Consult Note (Signed)
Reason for Referral: Thrombocytosis.   HPI: 49 year old woman currently off [place] where she lived for the last 13 years. She is a pleasant woman with history of hypertension, diabetes and hyperlipidemia. She was in her usual state of health where for the last 2 months have been complaining of intermittent abdominal pain and change in her bowel habits. She attributed to her diabetes medication or may be pancreatitis. She stopped Victoza an attempt to see if her symptoms are improved and apparently it did help with her symptoms. On 06/10/2014 she had a CBC done by Dr. Baird Cancer which showed her platelet counts to be elevated at 486. She had normal white cell count of 6.4 and a hemoglobin of 11.9. She had a normal differential. Previously, she had an elevated platelet count of 436 in October 2015. She had normal platelet count in August 2015 in preparation when she had a GYN surgery. Clinically, she is asymptomatic at this time. She had not reported any bronchitis or sinusitis recently. She did have sinusitis back in December. She does not report any headaches, blurry vision, syncope or seizures. She has not had any thrombosis or bleeding episodes. She does not report an chest pain, palpitation, orthopnea or leg edema. She does not report any cough, shortness of breath or difficulty breathing. She has not reported any fevers, chills, weight loss or appetite changes. She does not report change in her energy her performance status. She does not report any nausea, vomiting, hematochezia or melena. She does not report any frequency urgency or hesitancy. She does not report any hematuria or dysuria. Review of systems unremarkable.   Past Medical History  Diagnosis Date  . 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 ASCUS (atypical squamous cells of undetermined significance) on Pap smear 08/06/1999  . Breast mass in female 2002    Left  . Yeast vaginitis 2006  . Fibroid uterus 2010  .  Irregular bleeding 2011  . Diabetes mellitus without complication   . Hypertension   :  Past Surgical History  Procedure Laterality Date  . No past surgeries    . Dilatation & curettage/hysteroscopy with trueclear N/A 01/20/2014    Procedure: DILATATION & CURETTAGE/HYSTEROSCOPY WITH TRUCLEAR;  Surgeon: [name], MD;  Location: [facility];  Service: Gynecology;  Laterality: N/A;  . Dilatation & currettage/hysteroscopy with resectocope N/A 01/20/2014    Procedure: DILATATION & CURETTAGE/HYSTEROSCOPY WITH RESECTOCOPE;  Surgeon: [name], MD;  Location: [facility];  Service: Gynecology;  Laterality: N/A;  :   Current outpatient prescriptions:  .  B-D ULTRAFINE III SHORT PEN 31G X 8 MM MISC, USE WITH VICTOZA AS DIRECTED, Disp: 100 each, Rfl: 5 .  CRESTOR 20 MG tablet, TAKE 1 TABLET BY MOUTH DAILY, Disp: 30 tablet, Rfl: 3 .  glucose blood (ONE TOUCH ULTRA TEST) test strip, Use as instructed to check blood sugars 2 times per day, Disp: 300 each, Rfl: 1 .  Insulin Glargine (TOUJEO SOLOSTAR) 300 UNIT/ML SOPN, Inject 40 Units into the skin daily., Disp: 3 pen, Rfl: 1 .  insulin lispro (HUMALOG KWIKPEN) 100 UNIT/ML KiwkPen, Inject 0.08 mLs (8 Units total) into the skin daily before supper. (Patient taking differently: Inject 6-8 Units into the skin daily before supper. ), Disp: 15 mL, Rfl: 0 .  Lancet Devices (ONE TOUCH DELICA LANCING DEV) MISC, Use to check blood sugar 2 times per day, Disp: 1 each, Rfl: 1 .  lisinopril-hydrochlorothiazide (PRINZIDE,ZESTORETIC) 10-12.5 MG per tablet, Take 1 tablet by mouth daily.  Takes 1 tablet Monday, Wednesday and Friday, Disp: , Rfl:  .  metFORMIN (GLUCOPHAGE-XR) 750 MG 24 hr tablet, TAKE 3 TABLETS BY MOUTH EVERY DAY, Disp: 90 tablet, Rfl: 5 .  ONETOUCH DELICA LANCETS 78H MISC, Use to check blood sugars 2 times per day, Disp: 300 each, Rfl: 1 .  potassium chloride SA (K-DUR,KLOR-CON) 20 MEQ tablet, Take 1 tablet (20 mEq total) by mouth daily., Disp: 30 tablet,  Rfl: 3 .  Vitamin D, Ergocalciferol, (DRISDOL) 50000 UNITS CAPS capsule, Take 50,000 Units by mouth 2 (two) times a week. , Disp: , Rfl: :  No Known Allergies:  Family History  Problem Relation Age of Onset  . Diabetes Mother   . Diabetes Father   . Mental retardation Sister   :  History   Social History  . Marital Status: Single    Spouse Name: N/A    Number of Children: N/A  . Years of Education: N/A   Occupational History  . Not on file.   Social History Main Topics  . Smoking status: Never Smoker   . Smokeless tobacco: Never Used  . Alcohol Use: Yes     Comment: rarely  . Drug Use: No  . Sexual Activity: Yes    Birth Control/ Protection: Post-menopausal   Other Topics Concern  . Not on file   Social History Narrative  :  Pertinent items are noted in HPI.  Exam: ECOG 0 Blood pressure 134/79, pulse 69, temperature 98 F (36.7 C), temperature source Oral, resp. rate 18, height 5\' 9"  (1.753 m), weight 245 lb 11.2 oz (111.449 kg), SpO2 99 %. General appearance: alert and cooperative Head: Normocephalic, without obvious abnormality Throat: lips, mucosa, and tongue normal; teeth and gums normal Neck: no adenopathy Back: negative Resp: clear to auscultation bilaterally Cardio: regular rate and rhythm, S1, S2 normal, no murmur, click, rub or gallop GI: soft, non-tender; bowel sounds normal; no masses,  no organomegaly Extremities: extremities normal, atraumatic, no cyanosis or edema Pulses: 2+ and symmetric Skin: Skin color, texture, turgor normal. No rashes or lesions   Recent Labs  07/15/14 1048  WBC 5.3  HGB 11.6  HCT 35.8  PLT 374     Assessment and Plan:   49 year old woman with thrombocytosis diagnosed in January 2016 with a blood count of 486. She did have an elevated platelet count 1 430 back in October 2015. She had normal platelet counts in August 2015 and previously. Differential diagnosis was discussed with the patient today and most likely  represent reactive findings. Other causes such as iron deficiency anemia could also be contributing. I do not think this is a primary hematological disorder such as myeloproliferative disorder or leukemia.  Her platelet count is back to normal at 374 supporting this year he of this being and reactive process. She did have a stomach pain and discomfort that could be an inflammatory condition causing her platelet count to be elevated. She could also have had an episode of bronchitis or sinusitis in December and the January platelet count is a reflection of that.  From a management standpoint, I see no further investigation is needed and in all likelihood this represent a benign reactive findings.  I have recommended follow-up with her primary care physician with routine CBC check ups as a part of her physical but no change in the frequency at this time.  I willl be happy to see her in the future as needed if any other issues arise.

## 2014-07-15 NOTE — Progress Notes (Signed)
Please see consult note.  

## 2014-07-15 NOTE — Progress Notes (Signed)
Checked in new pt with no financial concerns prior to seeing the dr.  [name] states she's here for a hematology concern so financial assistance may not be needed but she has [name]'s card for any billing questions or concerns.

## 2014-07-18 ENCOUNTER — Other Ambulatory Visit (INDEPENDENT_AMBULATORY_CARE_PROVIDER_SITE_OTHER): Payer: BLUE CROSS/BLUE SHIELD

## 2014-07-18 DIAGNOSIS — E1165 Type 2 diabetes mellitus with hyperglycemia: Secondary | ICD-10-CM

## 2014-07-18 DIAGNOSIS — IMO0002 Reserved for concepts with insufficient information to code with codable children: Secondary | ICD-10-CM

## 2014-07-18 LAB — BASIC METABOLIC PANEL
BUN: 18 mg/dL (ref 6–23)
CALCIUM: 9.3 mg/dL (ref 8.4–10.5)
CO2: 30 mEq/L (ref 19–32)
Chloride: 101 mEq/L (ref 96–112)
Creatinine, Ser: 1.01 mg/dL (ref 0.40–1.20)
GFR: 75.11 mL/min (ref >60.00)
GLUCOSE: 219 mg/dL — AB (ref 70–99)
Potassium: 3.7 mEq/L (ref 3.5–5.1)
Sodium: 136 mEq/L (ref 135–145)

## 2014-07-20 ENCOUNTER — Ambulatory Visit: Payer: BC Managed Care – PPO | Admitting: Endocrinology

## 2014-07-20 LAB — FRUCTOSAMINE: Fructosamine: 293 umol/L — ABNORMAL HIGH (ref 190–270)

## 2014-07-21 ENCOUNTER — Ambulatory Visit (INDEPENDENT_AMBULATORY_CARE_PROVIDER_SITE_OTHER): Payer: BLUE CROSS/BLUE SHIELD | Admitting: Endocrinology

## 2014-07-21 ENCOUNTER — Encounter: Payer: Self-pay | Admitting: Endocrinology

## 2014-07-21 VITALS — BP 126/81 | HR 73 | Temp 98.3°F | Resp 14 | Ht 69.0 in | Wt 242.6 lb

## 2014-07-21 DIAGNOSIS — E1165 Type 2 diabetes mellitus with hyperglycemia: Secondary | ICD-10-CM

## 2014-07-21 DIAGNOSIS — IMO0002 Reserved for concepts with insufficient information to code with codable children: Secondary | ICD-10-CM

## 2014-07-21 DIAGNOSIS — I10 Essential (primary) hypertension: Secondary | ICD-10-CM

## 2014-07-21 NOTE — Progress Notes (Signed)
Patient ID: Alyssa Ross, female   DOB: 02-22-1966, 49 y.o.   MRN: [id number]   Reason for Appointment: Diabetes follow-up   History of Present Illness   Diagnosis: Type 2 DIABETES MELITUS, date of diagnosis 2005   She had previously been on metformin and Amaryl Because of inadequate control she was given Victoza in addition in 2011 With this her blood sugars have been significantly better and her A1c has been as low as 6.2 in 2013 Her blood sugars have been more difficult to control since 2014 an A1c consistently over 7% She  was started on insulin with Levemir in 06/2013 because of  increase in her A1c to 8.5% She had previously been taking Victoza along with Amaryl and metformin without consistent control Also has difficulty losing weight despite taking 1.8 mg Victoza, also had tried the 3 mg dose  RECENT history:  Because of episodic nausea and diarrhea in 1/16 she went off her Victoza, had seen her PCP for evaluation. She thinks she has not had any of these episodes with stopping Victoza. She does appear to have some relatively high postprandial readings now at night and also her morning sugars are higher in the last 3 days She thinks this is again from poor diet Also she has not been able to go for exercise the last few days and she thinks her blood sugars are better when she does  She continues to refuse intensification of her treatment and other drugs such as weight loss drugs despite poor control of her diabetes Also has been refusing to consider Invokana because of fear of candidiasis She does not want to consider the V-go pump  Since about 1/16 she has been on Toujeo instead of Levemir with relatively higher dose of 40 units Her fasting blood sugars have been overall better, previously median reading was 175 However she has had readings as high as 216 this week despite good compliance with the injection Mealtime insulin: She is not understanding the need for adjusting her  Humalog based on type of food and carbohydrate intake; she thinks she is adjusting the dose based on pre-meal blood sugar but she has only one pre-meal blood sugar in the evening recently Also has checked only sporadically during the day She only rarely takes insulin for breakfast or lunch, highest glucose after lunch has been 280 Postprandial readings after supper are consistently over 200  INSULIN dose: 40 units Toujeo at bedtime, Humalog 6-8 at supper mostly Oral hypoglycemic drugs: Amaryl 82m hs and metformin 2250 mg       Side effects from medications: None  Monitors blood glucose: Once a day   Glucometer: One Touch.          Blood Glucose readings by download of meter in the last 2 weeks:    PRE-MEAL Breakfast Lunch Dinner Bedtime Overall  Glucose range:  69-216   141  117  204, 268   Median:  132      148    POST-MEAL PC Breakfast PC Lunch PC Dinner  Glucose range:  115-246    342, 374   Mean/median:       Hypoglycemia: had a 69 reading on waking up once     Meals: 3 meals per day. Dinner 8-9 pm    DIET: She is usually able to control portions usually but has been unable to consistently control higher carb foods and sweets;  Physical activity: exercise: Irregular, was upto 4 days a week to the gym for  45 minutes              Wt Readings from Last 3 Encounters:  07/21/14 242 lb 9.6 oz (110.043 kg)  07/15/14 245 lb 11.2 oz (111.449 kg)  06/07/14 239 lb 6.4 oz (108.591 kg)    LABS:  Lab Results  Component Value Date   HGBA1C 9.4* 05/31/2014   HGBA1C 7.6* 02/11/2014   HGBA1C 7.4* 10/01/2013   Lab Results  Component Value Date   MICROALBUR 1.0 10/01/2013   [place] 99 12/14/2013   CREATININE 1.01 07/18/2014    Appointment on 07/18/2014  Component Date Value Ref Range Status  . Sodium 07/18/2014 136  135 - 145 mEq/L Final  . Potassium 07/18/2014 3.7  3.5 - 5.1 mEq/L Final  . Chloride 07/18/2014 101  96 - 112 mEq/L Final  . CO2 07/18/2014 30  19 - 32 mEq/L Final  .  Glucose, Bld 07/18/2014 219* 70 - 99 mg/dL Final  . BUN 07/18/2014 18  6 - 23 mg/dL Final  . Creatinine, Ser 07/18/2014 1.01  0.40 - 1.20 mg/dL Final  . Calcium 07/18/2014 9.3  8.4 - 10.5 mg/dL Final  . GFR 07/18/2014 75.11  >60.00 mL/min Final  . Fructosamine 07/18/2014 293* 190 - 270 umol/L Final  Appointment on 07/15/2014  Component Date Value Ref Range Status  . WBC 07/15/2014 5.3  3.9 - 10.3 10e3/uL Final  . NEUT# 07/15/2014 2.4  1.5 - 6.5 10e3/uL Final  . HGB 07/15/2014 11.6  11.6 - 15.9 g/dL Final  . HCT 07/15/2014 35.8  34.8 - 46.6 % Final  . Platelets 07/15/2014 374  145 - 400 10e3/uL Final  . MCV 07/15/2014 88.6  79.5 - 101.0 fL Final  . MCH 07/15/2014 28.7  25.1 - 34.0 pg Final  . MCHC 07/15/2014 32.4  31.5 - 36.0 g/dL Final  . RBC 07/15/2014 4.04  3.70 - 5.45 10e6/uL Final  . RDW 07/15/2014 12.9  11.2 - 14.5 % Final  . lymph# 07/15/2014 2.4  0.9 - 3.3 10e3/uL Final  . MONO# 07/15/2014 0.3  0.1 - 0.9 10e3/uL Final  . Eosinophils Absolute 07/15/2014 0.2  0.0 - 0.5 10e3/uL Final  . Basophils Absolute 07/15/2014 0.0  0.0 - 0.1 10e3/uL Final  . NEUT% 07/15/2014 45.1  38.4 - 76.8 % Final  . LYMPH% 07/15/2014 44.7  14.0 - 49.7 % Final  . MONO% 07/15/2014 6.4  0.0 - 14.0 % Final  . EOS% 07/15/2014 3.4  0.0 - 7.0 % Final  . BASO% 07/15/2014 0.4  0.0 - 2.0 % Final  . Sodium 07/15/2014 141  136 - 145 mEq/L Final  . Potassium 07/15/2014 3.9  3.5 - 5.1 mEq/L Final  . Chloride 07/15/2014 105  98 - 109 mEq/L Final  . CO2 07/15/2014 27  22 - 29 mEq/L Final  . Glucose 07/15/2014 169* 70 - 140 mg/dl Final  . BUN 07/15/2014 16.6  7.0 - 26.0 mg/dL Final  . Creatinine 07/15/2014 0.9  0.6 - 1.1 mg/dL Final  . Total Bilirubin 07/15/2014 0.42  0.20 - 1.20 mg/dL Final  . Alkaline Phosphatase 07/15/2014 62  40 - 150 U/L Final  . AST 07/15/2014 16  5 - 34 U/L Final  . ALT 07/15/2014 15  0 - 55 U/L Final  . Total Protein 07/15/2014 7.4  6.4 - 8.3 g/dL Final  . Albumin 07/15/2014 3.7  3.5 - 5.0  g/dL Final  . Calcium 07/15/2014 9.2  8.4 - 10.4 mg/dL Final  . Anion Gap 07/15/2014  9  3 - 11 mEq/L Final  . EGFR 07/15/2014 85* >90 ml/min/1.73 m2 Final   eGFR is calculated using the CKD-EPI Creatinine Equation (2009)  . Smear Result 07/15/2014 Smear Available   Final      Medication List       This list is accurate as of: 07/21/14 11:59 PM.  Always use your most recent med list.               B-D ULTRAFINE III SHORT PEN 31G X 8 MM Misc  Generic drug:  Insulin Pen Needle  USE WITH VICTOZA AS DIRECTED     CRESTOR 20 MG tablet  Generic drug:  rosuvastatin  TAKE 1 TABLET BY MOUTH DAILY     glucose blood test strip  Commonly known as:  ONE TOUCH ULTRA TEST  Use as instructed to check blood sugars 2 times per day     Insulin Glargine 300 UNIT/ML Sopn  Commonly known as:  TOUJEO SOLOSTAR  Inject 40 Units into the skin daily.     insulin lispro 100 UNIT/ML KiwkPen  Commonly known as:  HUMALOG KWIKPEN  Inject 0.08 mLs (8 Units total) into the skin daily before supper.     lisinopril-hydrochlorothiazide 10-12.5 MG per tablet  Commonly known as:  PRINZIDE,ZESTORETIC  Take 1 tablet by mouth daily. Takes 1 tablet Monday, Wednesday and Friday     metFORMIN 750 MG 24 hr tablet  Commonly known as:  GLUCOPHAGE-XR  TAKE 3 TABLETS BY MOUTH EVERY DAY     ONE TOUCH DELICA LANCING DEV Misc  Use to check blood sugar 2 times per day     ONETOUCH DELICA LANCETS 38G Misc  Use to check blood sugars 2 times per day     potassium chloride SA 20 MEQ tablet  Commonly known as:  K-DUR,KLOR-CON  Take 1 tablet (20 mEq total) by mouth daily.     Vitamin D (Ergocalciferol) 50000 UNITS Caps capsule  Commonly known as:  DRISDOL  Take 50,000 Units by mouth 2 (two) times a week.        Allergies: No Known Allergies  Past Medical History  Diagnosis Date  . 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 ASCUS (atypical squamous cells of undetermined significance) on  Pap smear 08/06/1999  . Breast mass in female 2002    Left  . Yeast vaginitis 2006  . Fibroid uterus 2010  . Irregular bleeding 2011  . Diabetes mellitus without complication   . Hypertension     Past Surgical History  Procedure Laterality Date  . No past surgeries    . Dilatation & curettage/hysteroscopy with trueclear N/A 01/20/2014    Procedure: DILATATION & CURETTAGE/HYSTEROSCOPY WITH TRUCLEAR;  Surgeon: [name], MD;  Location: [facility];  Service: Gynecology;  Laterality: N/A;  . Dilatation & currettage/hysteroscopy with resectocope N/A 01/20/2014    Procedure: DILATATION & CURETTAGE/HYSTEROSCOPY WITH RESECTOCOPE;  Surgeon: [name], MD;  Location: [facility];  Service: Gynecology;  Laterality: N/A;    Family History  Problem Relation Age of Onset  . Diabetes Mother   . Diabetes Father   . Mental retardation Sister     Social History:  reports that she has never smoked. She has never used smokeless tobacco. She reports that she drinks alcohol. She reports that she does not use illicit drugs.  REVIEW of systems:  She is currently being followed by her PCP for hypertension. Blood pressure is well controlled with Zestoretic  Has had  hypercholesterolemia, taking Crestor with good control, has mild increase in triglycerides also  Lab Results  Component Value Date   CHOL 197 12/14/2013   HDL 50.20 12/14/2013   LDLCALC 99 12/14/2013   LDLDIRECT 97.7 02/11/2014   TRIG 239.0* 12/14/2013   CHOLHDL 4 12/14/2013      Examination:   BP 126/81 mmHg  Pulse 73  Temp(Src) 98.3 F (36.8 C)  Resp 14  Ht 5' 9"  (1.753 m)  Wt 242 lb 9.6 oz (110.043 kg)  BMI 35.81 kg/m2  SpO2 98%  Body mass index is 35.81 kg/(m^2).   No pedal edema  Assessment/Plan:   Diabetes type 2, uncontrolled  She has poor control with A1c over 9% in 12/15 See history of present illness for detailed discussion of her current management, blood sugar patterns and problems identified Discussed  with the patient that she appears to getting more insulin deficient over time Although her preprandial blood sugars and fasting readings are significant better with Toujeo compared Levemir she has higher readings after meals with stopping Victoza Her fructosamine is only modestly increased and likely that she does not have consistent hyperglycemia She has not been able to lose weight. Again despite her watching her diet poorly she is refusing to take weight loss medications because of fear of side effects and cost She is reluctant to restart Victoza because of episodes of nausea and diarrhea  Discussed with the patient various options for improving her management but she is again not open to any newer treatments She agrees to see the diabetes educator to help manage her mealtime insulin after keeping a detailed record of her food intake, blood sugars before and after eating and insulin doses for all 3 meals for 3 days in a row She will also try to exercise more consistently Reconsider use of Invokana on the next visit or possibly Trulicity   Hypertension: Blood pressure is normal    Patient Instructions  Exercise 4/7 days  More sugars after meals  Take 10-14 Humalog at supper, 8-10 with large meals in day  Please check blood sugars at least half the time about 2 hours after any meal and 3 times per week on waking up. Please bring blood sugar monitor to each visit. Recommended blood sugar levels about 2 hours after meal is 140-180 and on waking up 90-130    Counseling time over 50% of today's 25 minute visit   Riccardo Holeman 07/22/2014, 8:41 AM

## 2014-07-21 NOTE — Patient Instructions (Addendum)
Exercise 4/7 days  More sugars after meals  Take 10-14 Humalog at supper, 8-10 with large meals in day  Please check blood sugars at least half the time about 2 hours after any meal and 3 times per week on waking up. Please bring blood sugar monitor to each visit. Recommended blood sugar levels about 2 hours after meal is 140-180 and on waking up 90-130

## 2014-07-25 ENCOUNTER — Other Ambulatory Visit: Payer: Self-pay | Admitting: Endocrinology

## 2014-08-09 ENCOUNTER — Encounter: Payer: BLUE CROSS/BLUE SHIELD | Admitting: Nutrition

## 2014-08-10 ENCOUNTER — Encounter: Payer: BLUE CROSS/BLUE SHIELD | Attending: Endocrinology | Admitting: Nutrition

## 2014-08-10 DIAGNOSIS — IMO0002 Reserved for concepts with insufficient information to code with codable children: Secondary | ICD-10-CM

## 2014-08-10 DIAGNOSIS — Z713 Dietary counseling and surveillance: Secondary | ICD-10-CM | POA: Diagnosis not present

## 2014-08-10 DIAGNOSIS — Z794 Long term (current) use of insulin: Secondary | ICD-10-CM | POA: Diagnosis not present

## 2014-08-10 DIAGNOSIS — E1165 Type 2 diabetes mellitus with hyperglycemia: Secondary | ICD-10-CM | POA: Diagnosis not present

## 2014-08-10 NOTE — Progress Notes (Signed)
She reports that she does not take Humalog before breakfast and lunch, because she does not think she needs it.  She is taking her Toujeo--40u, and FBSs are 130s-140.  She is rarely testing during the day.  Typical day: 6:30AM Up 8:30-9AM:  Bacon, egg, cheese sandwich on a hamberger bun, with a sweet tea.  Or 3 eggs with cheese on English muffin with water to drink.  1-2:30PM lunch is leftovers from supper the night before, or sandwich with fries, or chips, and a sweet tea, if she did not have one at breakfast.  6PM:  Supper 4-5 ounces protein of some sort, 2 veg. 1 starchy, 1 bread, and 1/2 the time a sweet drink.  She is taking her 8u before supper meal, with no blood sugar test at that time.  Stressed the need to see if her blood sugars are going up after her lunch and supper.  Suggested that she test her blood sugars before supper or at bedtime for the next few days, to see if elevated.  Told her that blood sugars over 140, would suggest that she needs some insulin before the previous meal.  Also warned her that drinking sweet drinks will require insulin, that will put weight on her very quickly.

## 2014-08-30 ENCOUNTER — Other Ambulatory Visit: Payer: BLUE CROSS/BLUE SHIELD

## 2014-08-31 ENCOUNTER — Other Ambulatory Visit: Payer: Self-pay | Admitting: Endocrinology

## 2014-09-01 ENCOUNTER — Encounter: Payer: BLUE CROSS/BLUE SHIELD | Admitting: Dietician

## 2014-09-01 ENCOUNTER — Ambulatory Visit: Payer: BLUE CROSS/BLUE SHIELD | Admitting: Endocrinology

## 2014-09-01 VITALS — Ht 68.5 in | Wt 243.0 lb

## 2014-09-01 DIAGNOSIS — E1165 Type 2 diabetes mellitus with hyperglycemia: Secondary | ICD-10-CM | POA: Diagnosis not present

## 2014-09-01 DIAGNOSIS — E118 Type 2 diabetes mellitus with unspecified complications: Secondary | ICD-10-CM

## 2014-09-01 NOTE — Progress Notes (Signed)
  Medical Nutrition Therapy:  Appt start time: 1638 end time:  1500.   Assessment:  Primary concerns today: Patient is here alone.  She has a hx of type 2 Diabetes since 2005.  Patient would like to learn more about meal planning especially with her exercise regime.  She checks her blood sugar mostly in the am (120-140) and 2 hours after meals occasionally.   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  She cooks and cleans, manages their doctor's appointments. Etc,   for them and they live in the condo beneath her.  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    Results for Alyssa Ross, Alyssa Ross (MRN [id number]) as of 09/01/2014 13:59  Ref. Range 03/29/2013 14:19 07/06/2013 13:34 10/01/2013 13:27 02/11/2014 10:34 05/31/2014 13:37  Hemoglobin A1C Latest Range: 4.6-6.5 % 7.8 (H) 8.5 (H) 7.4 (H) 7.6 (H) 9.4 (H)   Preferred Learning Style:   No preference indicated   Learning Readiness:    Ready  Change in progress  MEDICATIONS: Toujeo Solostar, humalog, Glucophage.  See list   DIETARY INTAKE: Patient does not like artificial sweeteners and eats backed goods (cake, cookies every day).  Patient states that she is not hungry for snacks but eats anyway.  She is an emotional eating.  Increased stress.    24-hr recall:  B ( AM): English muffin, 2 boiled eggs or sausage., sugar sweetened  Iced tea  Snk ( AM):  L ( PM): leftovers, or subway, chips, regular soda or swt tea Snk ( PM): chips D ( PM): meat, starch, vegetable Snk ( PM): fruit or deli meat Beverages: water, sweet tea, regular soda  Usual physical activity: Patient goes to the gym 2 times per week and does high intensity interval workouts as part of a group class.  Runs 3-4 times per week for 45 on weekdays and 2 hours on weekends.  She is training for [employer] 10 mile race.  Estimated energy needs: 1600 calories 180 g carbohydrates 100 g protein 53 g fat  Progress Towards Goal(s):  In progress.   Nutritional  Diagnosis:  NB-1.1 Food and nutrition-related knowledge deficit As related to balance of carbohydrate, protein, and fat.  As evidenced by patient report.    Intervention:  Nutrition counseling and diabetes education initiated. Discussed Carb Counting by food group as method of portion control, reading food labels, and benefits of increased activity. Also discussed basic physiology of Diabetes, target BG ranges pre and post meals, and A1c.  . Plan:  Be mindful about what you drink. Consider eating away from the TV and the computer.  Be mindful when eating.  When am I full? Aim for 3-4 Carb Choices per meal (45-60 grams) +/- 1 either way  Aim for 0-2 Carbs per snack if hungry  Include protein in moderation with your meals and snacks Consider reading food labels for Total Carbohydrate and Fat Grams of foods Continue your exercise regime. Consider checking BG at alternate times per day as directed by MD  Consider taking medication as directed by MD  Teaching Method Utilized:  Visual Auditory Hands on  Handouts given during visit include:  Diabetic Meals in 30 minutes or less from the ADA  Meal plan card  Snack list  Label reading  Barriers to learning/adherence to lifestyle change: none  Demonstrated degree of understanding via:  Teach Back   Monitoring/Evaluation:  Dietary intake, exercise, label reading, and body weight prn.

## 2014-09-01 NOTE — Patient Instructions (Signed)
Plan:  Be mindful about what you drink. Consider eating away from the TV and the computer.  Be mindful when eating.  When am I full? Aim for 3-4 Carb Choices per meal (45-60 grams) +/- 1 either way  Aim for 0-2 Carbs per snack if hungry  Include protein in moderation with your meals and snacks Consider reading food labels for Total Carbohydrate and Fat Grams of foods Continue your exercise regime. Consider checking BG at alternate times per day as directed by MD  Consider taking medication as directed by MD

## 2014-09-02 ENCOUNTER — Ambulatory Visit: Payer: BLUE CROSS/BLUE SHIELD | Admitting: Endocrinology

## 2014-09-05 ENCOUNTER — Other Ambulatory Visit: Payer: Self-pay | Admitting: Endocrinology

## 2014-09-07 ENCOUNTER — Telehealth: Payer: Self-pay | Admitting: Nutrition

## 2014-09-07 NOTE — Telephone Encounter (Signed)
I asked her if she is testing her blood sugars before lunch or supper. She said "no".  Told her that it was important to do this, to see if she needs Humalog before breakfast and lunch.  She agreed to do this this week, and let me know if the readings are over 140.

## 2014-09-07 NOTE — Patient Instructions (Signed)
Test blood sugars before lunch and supper once a week, and call if those readings are over 140. Stop drinking sweet drinks.

## 2014-09-19 ENCOUNTER — Other Ambulatory Visit: Payer: Self-pay | Admitting: Endocrinology

## 2014-09-20 ENCOUNTER — Other Ambulatory Visit (INDEPENDENT_AMBULATORY_CARE_PROVIDER_SITE_OTHER): Payer: BLUE CROSS/BLUE SHIELD

## 2014-09-20 DIAGNOSIS — E1165 Type 2 diabetes mellitus with hyperglycemia: Secondary | ICD-10-CM

## 2014-09-20 DIAGNOSIS — IMO0002 Reserved for concepts with insufficient information to code with codable children: Secondary | ICD-10-CM

## 2014-09-20 LAB — BASIC METABOLIC PANEL
BUN: 19 mg/dL (ref 6–23)
CALCIUM: 10.1 mg/dL (ref 8.4–10.5)
CHLORIDE: 101 meq/L (ref 96–112)
CO2: 33 mEq/L — ABNORMAL HIGH (ref 19–32)
Creatinine, Ser: 0.94 mg/dL (ref 0.40–1.20)
GFR: 81.54 mL/min (ref >60.00)
Glucose, Bld: 90 mg/dL (ref 70–99)
POTASSIUM: 3.8 meq/L (ref 3.5–5.1)
Sodium: 137 mEq/L (ref 135–145)

## 2014-09-20 LAB — HEMOGLOBIN A1C: HEMOGLOBIN A1C: 8.1 % — AB (ref 4.6–6.5)

## 2014-09-22 ENCOUNTER — Other Ambulatory Visit: Payer: Self-pay | Admitting: *Deleted

## 2014-09-22 MED ORDER — INSULIN PEN NEEDLE 31G X 8 MM MISC: Status: DC

## 2014-09-22 MED ORDER — POTASSIUM CHLORIDE CRYS ER 20 MEQ PO TBCR: 20.0000 meq | EXTENDED_RELEASE_TABLET | Freq: Every day | ORAL | Status: DC

## 2014-09-22 MED ORDER — METFORMIN HCL ER 750 MG PO TB24: 2250.0000 mg | ORAL_TABLET | Freq: Every day | ORAL | Status: DC

## 2014-09-23 ENCOUNTER — Ambulatory Visit (INDEPENDENT_AMBULATORY_CARE_PROVIDER_SITE_OTHER): Payer: BLUE CROSS/BLUE SHIELD | Admitting: Endocrinology

## 2014-09-23 ENCOUNTER — Encounter: Payer: Self-pay | Admitting: Endocrinology

## 2014-09-23 ENCOUNTER — Other Ambulatory Visit: Payer: Self-pay | Admitting: *Deleted

## 2014-09-23 VITALS — BP 127/82 | HR 65 | Temp 98.5°F | Resp 16 | Ht 69.0 in | Wt 248.6 lb

## 2014-09-23 DIAGNOSIS — E1165 Type 2 diabetes mellitus with hyperglycemia: Secondary | ICD-10-CM

## 2014-09-23 DIAGNOSIS — IMO0002 Reserved for concepts with insufficient information to code with codable children: Secondary | ICD-10-CM

## 2014-09-23 DIAGNOSIS — I10 Essential (primary) hypertension: Secondary | ICD-10-CM

## 2014-09-23 MED ORDER — METFORMIN HCL ER 750 MG PO TB24: 2250.0000 mg | ORAL_TABLET | Freq: Every day | ORAL | Status: DC

## 2014-09-23 NOTE — Progress Notes (Signed)
Patient ID: Alyssa Ross, female   DOB: Mar 06, 1966, 49 y.o.   MRN: [id number]   Reason for Appointment: Diabetes follow-up   History of Present Illness   Diagnosis: Type 2 DIABETES MELITUS, date of diagnosis 2005   She had previously been on metformin and Amaryl Because of inadequate control she was given Victoza in addition in 2011 With this her blood sugars have been significantly better and her A1c has been as low as 6.2 in 2013 Her blood sugars have been more difficult to control since 2014 an A1c consistently over 7% She  was started on insulin with Levemir in 06/2013 because of  increase in her A1c to 8.5% She had previously been taking Victoza along with Amaryl and metformin without consistent control Also had difficulty losing weight despite taking 1.8 mg Victoza, also had tried the 3 mg dose Because of episodic nausea and diarrhea in 1/16 she went off her Victoza  RECENT history:   INSULIN dose: 40 units Toujeo at bedtime, Humalog 4-6  at supper mostly  She does appear to have some improvement in blood sugar control since her last visit Because of relatively high postprandial readings at night and also some high morning readings she was referred to the dietitian and diabetes nurse educator to help improve her compliance. She had been asked to reduce higher fat meals and drinks with sugar which she was using consistently Also she has been able to start back on exercise regimen which she had not been doing Her Humalog dose was recommended to be increased to 10-14 units Since about 1/16 she has been on Toujeo instead of Levemir at bedtime with relatively higher dose of 40 units HEMOGLOBIN A1c is improved at 8.1 but she has made changes in her management only a few weeks ago  Current blood sugars and problems identified as follows  Her fasting blood sugars have been overall better with some readings below 100 especially recently and lowest in the 80s without overnight  hypoglycemia  She has done only a few readings after her evening meal and none recently but these appear to be almost consistently improved  She has a couple of readings over 200 after lunch but has not checked these recently or consistently  Blood sugars are generally higher at suppertime with some readings around 190  , previously median reading was 175 Mealtime insulin: She has not taken any insulin for lunch recently and is only taking small amounts at supper. She thinks she is needing to take Humalog based only on the pre-meal blood sugar other than postprandial targets are what she is eating  Oral hypoglycemic drugs: Amaryl 4mg  hs and metformin 2250 mg       Side effects from medications: None  Monitors blood glucose: Once a day   Glucometer: One Touch.          Blood Glucose readings from monitor download are detailed as above   Hypoglycemia: None recently  Meals: 3 meals per day. Dinner 8-9 pm    DIET: She is usually able to control portions usually but has been unable to consistently control higher carb foods and sweets;  Physical activity: exercise:   upto 5 days a week to the gym for 45 minutes              Wt Readings from Last 3 Encounters:  09/23/14 248 lb 9.6 oz (112.764 kg)  09/01/14 243 lb (110.224 kg)  07/21/14 242 lb 9.6 oz (110.043 kg)  LABS:  Lab Results  Component Value Date   HGBA1C 8.1* 09/20/2014   HGBA1C 9.4* 05/31/2014   HGBA1C 7.6* 02/11/2014   Lab Results  Component Value Date   MICROALBUR 1.0 10/01/2013   LDLCALC 99 12/14/2013   CREATININE 0.94 09/20/2014    Appointment on 09/20/2014  Component Date Value Ref Range Status  . Hgb A1c MFr Bld 09/20/2014 8.1* 4.6 - 6.5 % Final   Glycemic Control Guidelines for People with Diabetes:Non Diabetic:  <6%Goal of Therapy: <7%Additional Action Suggested:  >8%   . Sodium 09/20/2014 137  135 - 145 mEq/L Final  . Potassium 09/20/2014 3.8  3.5 - 5.1 mEq/L Final  . Chloride 09/20/2014 101  96 - 112  mEq/L Final  . CO2 09/20/2014 33* 19 - 32 mEq/L Final  . Glucose, Bld 09/20/2014 90  70 - 99 mg/dL Final  . BUN 09/20/2014 19  6 - 23 mg/dL Final  . Creatinine, Ser 09/20/2014 0.94  0.40 - 1.20 mg/dL Final  . Calcium 09/20/2014 10.1  8.4 - 10.5 mg/dL Final  . GFR 09/20/2014 81.54  >60.00 mL/min Final      Medication List       This list is accurate as of: 09/23/14 11:59 PM.  Always use your most recent med list.               B-D ULTRAFINE III SHORT PEN 31G X 8 MM Misc  Generic drug:  Insulin Pen Needle  USE 3 NEEDLES PER DAY     Insulin Pen Needle 31G X 8 MM Misc  Commonly known as:  B-D ULTRAFINE III SHORT PEN  USE WITH VICTOZA AS DIRECTED     CRESTOR 20 MG tablet  Generic drug:  rosuvastatin  TAKE 1 TABLET BY MOUTH EVERY DAY     Insulin Glargine 300 UNIT/ML Sopn  Commonly known as:  TOUJEO SOLOSTAR  Inject 40 Units into the skin daily.     insulin lispro 100 UNIT/ML KiwkPen  Commonly known as:  HUMALOG KWIKPEN  Inject 0.08 mLs (8 Units total) into the skin daily before supper.     lisinopril-hydrochlorothiazide 10-12.5 MG per tablet  Commonly known as:  PRINZIDE,ZESTORETIC  Take 1 tablet by mouth daily. Takes 1 tablet Monday, Wednesday and Friday     metFORMIN 750 MG 24 hr tablet  Commonly known as:  GLUCOPHAGE-XR  Take 3 tablets (2,250 mg total) by mouth daily.     ONE TOUCH DELICA LANCING DEV Misc  Use to check blood sugar 2 times per day     ONE TOUCH ULTRA TEST test strip  Generic drug:  glucose blood  USE AS INSTRUCTED TO CHECK BLOOD SUGAR 2 TIMES PER DAY     ONETOUCH DELICA LANCETS 93T Misc  USE TO CHECK BLOOD SUGARS 2 TIMES PER DAY     potassium chloride SA 20 MEQ tablet  Commonly known as:  K-DUR,KLOR-CON  Take 1 tablet (20 mEq total) by mouth daily.     Vitamin D (Ergocalciferol) 50000 UNITS Caps capsule  Commonly known as:  DRISDOL  Take 50,000 Units by mouth 2 (two) times a week.        Allergies: No Known Allergies  Past Medical  History  Diagnosis Date  . 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 ASCUS (atypical squamous cells of undetermined significance) on Pap smear 08/06/1999  . Breast mass in female 2002    Left  . Yeast vaginitis 2006  . Fibroid uterus 2010  .  Irregular bleeding 2011  . Diabetes mellitus without complication   . Hypertension     Past Surgical History  Procedure Laterality Date  . No past surgeries    . Dilatation & curettage/hysteroscopy with trueclear N/A 01/20/2014    Procedure: DILATATION & CURETTAGE/HYSTEROSCOPY WITH TRUCLEAR;  Surgeon: [name], MD;  Location: [facility];  Service: Gynecology;  Laterality: N/A;  . Dilatation & currettage/hysteroscopy with resectocope N/A 01/20/2014    Procedure: DILATATION & CURETTAGE/HYSTEROSCOPY WITH RESECTOCOPE;  Surgeon: [name], MD;  Location: [facility];  Service: Gynecology;  Laterality: N/A;    Family History  Problem Relation Age of Onset  . Diabetes Mother   . Diabetes Father   . Mental retardation Sister     Social History:  reports that she has never smoked. She has never used smokeless tobacco. She reports that she drinks alcohol. She reports that she does not use illicit drugs.  REVIEW of systems:  She is being followed by her PCP for hypertension. Blood pressure is well controlled with Zestoretic  Has had hypercholesterolemia, taking Crestor with good control, has mild increase in triglycerides also  Lab Results  Component Value Date   CHOL 197 12/14/2013   HDL 50.20 12/14/2013   LDLCALC 99 12/14/2013   LDLDIRECT 97.7 02/11/2014   TRIG 239.0* 12/14/2013   CHOLHDL 4 12/14/2013      Examination:   BP 127/82 mmHg  Pulse 65  Temp(Src) 98.5 F (36.9 C)  Resp 16  Ht 5\' 9"  (1.753 m)  Wt 248 lb 9.6 oz (112.764 kg)  BMI 36.69 kg/m2  SpO2 98%  Body mass index is 36.69 kg/(m^2).   No pedal edema  Assessment/Plan:   Diabetes type 2, uncontrolled  She has had poor control with A1c over  9% in 12/15 Although this has improved to 8.1 she has made changes in her lifestyle only in the last 3-4 weeks  See history of present illness for detailed discussion of her current management, blood sugar patterns and problems identified  Previously had been recommended additional therapy for her diabetes along with basal bolus insulin regimen or V-go pump but she is trying to do better with her lifestyle including improved diet and regular exercise She does need to do better with the following:  Understanding the need for using mealtime insulin to cover her carbohydrate intake and adjust the insulin based on what she is eating.  Discussed that she does need to take some insulin at lunchtime based on her type of meal and carbohydrates since she tends to have high readings before supper  She needs to do more consistent readings after supper to help target her suppertime dose to keep postprandial readings within target  Have copied the information given by dietitian for her review so that she can follow these consistently; currently she is still gaining weight despite trying to improve her diet  Trial of Toujeo in the morning to see if daytime blood sugars are better controlled  Discussed adjustment of Toujeo based on fasting blood sugar trend   Consistent exercise  Hypertension: Blood pressure is normal    Patient Instructions  Be mindful about what you drink. Consider eating away from the TV and the computer. Be mindful when eating. When am I full? Aim for 3-4 Carb Choices per meal (45-60 grams) +/- 1 either way  Aim for 0-2 Carbs per snack if hungry  Include protein in moderation with your meals and snacks Consider reading food labels for Total Carbohydrate  and Fat Grams of foods  Take Toujeo 40 in ams on waking up and keep am sugar <130  Must take Humalog for all carbs and keep 2-3hr reading <180  Please check blood sugars at least half the time about 2 hours after any  meal and 3 times per week on waking up. Please bring blood sugar monitor to each visit. Recommended blood sugar levels about 2 hours after meal is 140-180 and on waking up 90-130      Counseling time over 50% of today's 25 minute visit   Ebbie Cherry 09/25/2014, 9:17 PM

## 2014-09-23 NOTE — Patient Instructions (Addendum)
Be mindful about what you drink. Consider eating away from the TV and the computer. Be mindful when eating. When am I full? Aim for 3-4 Carb Choices per meal (45-60 grams) +/- 1 either way  Aim for 0-2 Carbs per snack if hungry  Include protein in moderation with your meals and snacks Consider reading food labels for Total Carbohydrate and Fat Grams of foods  Take Toujeo 40 in ams on waking up and keep am sugar <130  Must take Humalog for all carbs and keep 2-3hr reading <180  Please check blood sugars at least half the time about 2 hours after any meal and 3 times per week on waking up. Please bring blood sugar monitor to each visit. Recommended blood sugar levels about 2 hours after meal is 140-180 and on waking up 90-130

## 2014-09-26 ENCOUNTER — Other Ambulatory Visit: Payer: Self-pay | Admitting: *Deleted

## 2014-09-26 MED ORDER — INSULIN PEN NEEDLE 31G X 8 MM MISC: Status: DC

## 2014-09-26 MED ORDER — METFORMIN HCL ER 750 MG PO TB24: 2250.0000 mg | ORAL_TABLET | Freq: Every day | ORAL | Status: DC

## 2014-09-27 ENCOUNTER — Other Ambulatory Visit: Payer: Self-pay | Admitting: Internal Medicine

## 2014-09-27 NOTE — Telephone Encounter (Signed)
Re-directing to Dr Dwyane Dee

## 2014-09-28 NOTE — Telephone Encounter (Signed)
Re-directing to Dr Dwyane Dee.

## 2014-10-16 ENCOUNTER — Other Ambulatory Visit: Payer: Self-pay | Admitting: Endocrinology

## 2014-12-05 ENCOUNTER — Other Ambulatory Visit: Payer: Self-pay

## 2014-12-08 ENCOUNTER — Telehealth: Payer: Self-pay | Admitting: *Deleted

## 2014-12-08 NOTE — Telephone Encounter (Signed)
I presume she is talking about blood sugars are running high in the morning. Need to know what her blood sugars are after dinner and other meals. For now she can change the Toujeo to twice a day, 20 units in the morning and 24 at bedtime She needs to check blood sugars consistently after meals before her next visit for evaluation

## 2014-12-08 NOTE — Telephone Encounter (Signed)
Noted, patient is aware. 

## 2014-12-08 NOTE — Telephone Encounter (Signed)
Patient called today, she said this morning she checked her sugar and it was 189, she took 43 units of Toujeo and 15 minutes later it dropped to 52.   She was able to get it up to 134.  She also says for the last 2 weeks her sugars have been consistently running between 180-200, she is not taking any steroids although she does say she has been having sinus issues. Please advise.

## 2014-12-20 ENCOUNTER — Other Ambulatory Visit (INDEPENDENT_AMBULATORY_CARE_PROVIDER_SITE_OTHER): Payer: BLUE CROSS/BLUE SHIELD

## 2014-12-20 DIAGNOSIS — E1165 Type 2 diabetes mellitus with hyperglycemia: Secondary | ICD-10-CM | POA: Diagnosis not present

## 2014-12-20 DIAGNOSIS — IMO0002 Reserved for concepts with insufficient information to code with codable children: Secondary | ICD-10-CM

## 2014-12-20 LAB — COMPREHENSIVE METABOLIC PANEL
ALT: 16 U/L (ref 0–35)
AST: 18 U/L (ref 0–37)
Albumin: 4.1 g/dL (ref 3.5–5.2)
Alkaline Phosphatase: 56 U/L (ref 39–117)
BUN: 17 mg/dL (ref 6–23)
CO2: 30 mEq/L (ref 19–32)
CREATININE: 0.89 mg/dL (ref 0.40–1.20)
Calcium: 9.9 mg/dL (ref 8.4–10.5)
Chloride: 100 mEq/L (ref 96–112)
GFR: 86.76 mL/min (ref >60.00)
GLUCOSE: 141 mg/dL — AB (ref 70–99)
Potassium: 3.8 mEq/L (ref 3.5–5.1)
SODIUM: 137 meq/L (ref 135–145)
TOTAL PROTEIN: 7.9 g/dL (ref 6.0–8.3)
Total Bilirubin: 0.4 mg/dL (ref 0.2–1.2)

## 2014-12-20 LAB — LIPID PANEL
CHOLESTEROL: 158 mg/dL (ref 0–200)
HDL: 49.4 mg/dL (ref >39.00)
LDL Cholesterol: 77 mg/dL (ref 0–99)
NONHDL: 108.6
TRIGLYCERIDES: 158 mg/dL — AB (ref 0.0–149.0)
Total CHOL/HDL Ratio: 3
VLDL: 31.6 mg/dL (ref 0.0–40.0)

## 2014-12-20 LAB — HEMOGLOBIN A1C: Hgb A1c MFr Bld: 8.3 % — ABNORMAL HIGH (ref 4.6–6.5)

## 2014-12-20 LAB — MICROALBUMIN / CREATININE URINE RATIO
CREATININE, U: 50.4 mg/dL
Microalb Creat Ratio: 1.4 mg/g (ref 0.0–30.0)

## 2014-12-23 ENCOUNTER — Ambulatory Visit (INDEPENDENT_AMBULATORY_CARE_PROVIDER_SITE_OTHER): Payer: BLUE CROSS/BLUE SHIELD | Admitting: Endocrinology

## 2014-12-23 ENCOUNTER — Other Ambulatory Visit: Payer: Self-pay | Admitting: *Deleted

## 2014-12-23 ENCOUNTER — Encounter: Payer: Self-pay | Admitting: Endocrinology

## 2014-12-23 VITALS — BP 120/80 | HR 96 | Temp 97.8°F | Resp 16 | Ht 69.0 in | Wt 247.0 lb

## 2014-12-23 DIAGNOSIS — I10 Essential (primary) hypertension: Secondary | ICD-10-CM

## 2014-12-23 DIAGNOSIS — E669 Obesity, unspecified: Secondary | ICD-10-CM | POA: Diagnosis not present

## 2014-12-23 DIAGNOSIS — E1165 Type 2 diabetes mellitus with hyperglycemia: Secondary | ICD-10-CM | POA: Diagnosis not present

## 2014-12-23 DIAGNOSIS — IMO0002 Reserved for concepts with insufficient information to code with codable children: Secondary | ICD-10-CM

## 2014-12-23 MED ORDER — EXENATIDE ER 2 MG ~~LOC~~ SRER: 2.0000 mg | SUBCUTANEOUS | Status: DC

## 2014-12-23 MED ORDER — GLUCOSE BLOOD VI STRP: ORAL_STRIP | Status: DC

## 2014-12-23 MED ORDER — ONETOUCH DELICA LANCETS 33G MISC: Status: DC

## 2014-12-23 NOTE — Progress Notes (Addendum)
Patient ID: Alyssa Ross, female   DOB: [birth year], 49 y.o.   MRN: [id number]   Reason for Appointment: Diabetes follow-up   History of Present Illness   Diagnosis: Type 2 DIABETES MELITUS, date of diagnosis 2005   She had previously been on metformin and Amaryl Because of inadequate control she was given Victoza in addition in 2011 With this her blood sugars have been significantly better and her A1c has been as low as 6.2 in 2013 Her blood sugars have been more difficult to control since 2014 an A1c consistently over 7% She  was started on insulin with Levemir in 06/2013 because of  increase in her A1c to 8.5% She had previously been taking Victoza along with Amaryl and metformin without consistent control Also had difficulty losing weight despite taking 1.8 mg Victoza, also had tried the 3 mg dose Because of episodic nausea and diarrhea in 1/16 she went off her Victoza  RECENT history:   INSULIN dose: 40 units Toujeo at breakfast, Humalog 4-6  at some meals  She still has difficulty getting her blood sugars under control Previously she was referred to the dietitian and diabetes nurse educator to help improve her compliance. She had been asked to reduce higher fat meals and drinks with sugar which she was using consistently Her Humalog dose was recommended to be increased to 10-14 units Since about 1/16 she has been on Toujeo instead of Levemir at bedtime with relatively higher dose of 40 units HEMOGLOBIN A1c is slightly higher at 8.3%.  Current blood sugars and problems identified as follows  Her fasting blood sugars have been mostly high compared to her last visit and averaging about 175  She does not check her blood sugars much later in the day but they are consistently high  Her highest sugars are related night after supper  She is poorly compliant with taking her Humalog with her meals and will take it only sporadically, may take up to 10 units  She does not take  any Humalog when she is eating a low carbohydrate meal like salad  She had an episode of HYPOGLYCEMIA after taking her morning Toujeo before breakfast for no apparent reason and was suggested giving herself Toujeo twice a day; however because of inconvenience she is back on once a day morning dose this week  She again has difficulty watching her diet with eating sweets sometimes and not controlling portions, also not losing weight  Oral hypoglycemic drugs: Amaryl '4mg'$  hs and metformin 2250 mg       Side effects from medications: None  Monitors blood glucose: Once or twice a day   Glucometer: One Touch.          Blood Glucose readings from monitor download are detailed as above  Mean values apply above for all meters except median for One Touch  PRE-MEAL Fasting Lunch Dinner Bedtime Overall  Glucose range:  146-226       Mean/median:  182      187    POST-MEAL PC Breakfast PC Lunch PC Dinner  Glucose range:  134-287   163-185   202-320   Mean/median:       Meals: 3 meals per day. Dinner 8-9 pm    DIET: As above, has been unable to consistently control higher carb foods and sweets; tries to reduce portions Physical activity: exercise:  Recently 2-4 days a week to the gym for 45 minutes  Wt Readings from Last 3 Encounters:  12/23/14 247 lb (112.038 kg)  09/23/14 248 lb 9.6 oz (112.764 kg)  09/01/14 243 lb (110.224 kg)    LABS:  Lab Results  Component Value Date   HGBA1C 8.3* 12/20/2014   HGBA1C 8.1* 09/20/2014   HGBA1C 9.4* 05/31/2014   Lab Results  Component Value Date   MICROALBUR <0.7 12/20/2014   LDLCALC 77 12/20/2014   CREATININE 0.89 12/20/2014    Lab on 12/20/2014  Component Date Value Ref Range Status  . Hgb A1c MFr Bld 12/20/2014 8.3* 4.6 - 6.5 % Final   Glycemic Control Guidelines for People with Diabetes:Non Diabetic:  <6%Goal of Therapy: <7%Additional Action Suggested:  >8%   . Sodium 12/20/2014 137  135 - 145 mEq/L Final  . Potassium 12/20/2014  3.8  3.5 - 5.1 mEq/L Final  . Chloride 12/20/2014 100  96 - 112 mEq/L Final  . CO2 12/20/2014 30  19 - 32 mEq/L Final  . Glucose, Bld 12/20/2014 141* 70 - 99 mg/dL Final  . BUN 12/20/2014 17  6 - 23 mg/dL Final  . Creatinine, Ser 12/20/2014 0.89  0.40 - 1.20 mg/dL Final  . Total Bilirubin 12/20/2014 0.4  0.2 - 1.2 mg/dL Final  . Alkaline Phosphatase 12/20/2014 56  39 - 117 U/L Final  . AST 12/20/2014 18  0 - 37 U/L Final  . ALT 12/20/2014 16  0 - 35 U/L Final  . Total Protein 12/20/2014 7.9  6.0 - 8.3 g/dL Final  . Albumin 12/20/2014 4.1  3.5 - 5.2 g/dL Final  . Calcium 12/20/2014 9.9  8.4 - 10.5 mg/dL Final  . GFR 12/20/2014 86.76  >60.00 mL/min Final  . Cholesterol 12/20/2014 158  0 - 200 mg/dL Final   ATP III Classification       Desirable:  < 200 mg/dL               Borderline High:  200 - 239 mg/dL          High:  > = 240 mg/dL  . Triglycerides 12/20/2014 158.0* 0.0 - 149.0 mg/dL Final   Normal:  <150 mg/dLBorderline High:  150 - 199 mg/dL  . HDL 12/20/2014 49.40  >39.00 mg/dL Final  . VLDL 12/20/2014 31.6  0.0 - 40.0 mg/dL Final  . LDL Cholesterol 12/20/2014 77  0 - 99 mg/dL Final  . Total CHOL/HDL Ratio 12/20/2014 3   Final                  Men          Women1/2 Average Risk     3.4          3.3Average Risk          5.0          4.42X Average Risk          9.6          7.13X Average Risk          15.0          11.0                      . NonHDL 12/20/2014 108.60   Final   NOTE:  Non-HDL goal should be 30 mg/dL higher than patient's LDL goal (i.e. LDL goal of < 70 mg/dL, would have non-HDL goal of < 100 mg/dL)  . Microalb, Ur 12/20/2014 <0.7  0.0 - 1.9 mg/dL Final  . Creatinine,U 12/20/2014  50.4   Final  . Microalb Creat Ratio 12/20/2014 1.4  0.0 - 30.0 mg/g Final      Medication List       This list is accurate as of: 12/23/14  3:05 PM.  Always use your most recent med list.               CRESTOR 20 MG tablet  Generic drug:  rosuvastatin  TAKE 1 TABLET BY MOUTH EVERY  DAY     Exenatide ER 2 MG Srer  Commonly known as:  BYDUREON  Inject 2 mg into the skin once a week.     glucose blood test strip  Commonly known as:  ONE TOUCH ULTRA TEST  USE AS INSTRUCTED TO CHECK BLOOD SUGAR 2 TIMES PER DAY DX code E11.65     HUMALOG KWIKPEN 100 UNIT/ML KiwkPen  Generic drug:  insulin lispro  INJECT 8 UNITS INTO THE SKIN AT BEDTIME     Insulin Glargine 300 UNIT/ML Sopn  Commonly known as:  TOUJEO SOLOSTAR  Inject 40 Units into the skin daily.     Insulin Pen Needle 31G X 8 MM Misc  Commonly known as:  B-D ULTRAFINE III SHORT PEN  USE 3 NEEDLES PER DAY     lisinopril-hydrochlorothiazide 10-12.5 MG per tablet  Commonly known as:  PRINZIDE,ZESTORETIC  Take 1 tablet by mouth daily. Takes 1 tablet Monday, Wednesday and Friday     metFORMIN 750 MG 24 hr tablet  Commonly known as:  GLUCOPHAGE-XR  Take 3 tablets (2,250 mg total) by mouth daily.     ONE TOUCH DELICA LANCING DEV Misc  Use to check blood sugar 2 times per day     ONETOUCH DELICA LANCETS 19J Misc  USE TO CHECK BLOOD SUGARS 2 TIMES PER DAY DX CODE E11.65     potassium chloride SA 20 MEQ tablet  Commonly known as:  K-DUR,KLOR-CON  Take 1 tablet (20 mEq total) by mouth daily.     Vitamin D (Ergocalciferol) 50000 UNITS Caps capsule  Commonly known as:  DRISDOL  Take 50,000 Units by mouth 2 (two) times a week.        Allergies: No Known Allergies  Past Medical History  Diagnosis Date  . 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 ASCUS (atypical squamous cells of undetermined significance) on Pap smear 08/06/1999  . Breast mass in female 2002    Left  . Yeast vaginitis 2006  . Fibroid uterus 2010  . Irregular bleeding 2011  . Diabetes mellitus without complication   . Hypertension     Past Surgical History  Procedure Laterality Date  . No past surgeries    . Dilatation & curettage/hysteroscopy with trueclear N/A 01/20/2014    Procedure: DILATATION &  CURETTAGE/HYSTEROSCOPY WITH TRUCLEAR;  Surgeon: [name], MD;  Location: [facility];  Service: Gynecology;  Laterality: N/A;  . Dilatation & currettage/hysteroscopy with resectocope N/A 01/20/2014    Procedure: DILATATION & CURETTAGE/HYSTEROSCOPY WITH RESECTOCOPE;  Surgeon: [name], MD;  Location: [facility];  Service: Gynecology;  Laterality: N/A;    Family History  Problem Relation Age of Onset  . Diabetes Mother   . Diabetes Father   . Mental retardation Sister     Social History:  reports that she has never smoked. She has never used smokeless tobacco. She reports that she drinks alcohol. She reports that she does not use illicit drugs.  REVIEW of systems:  She is being followed by her PCP for  hypertension. Blood pressure is well controlled with Zestoretic  Has had hypercholesterolemia, taking Crestor with good control, has mild increase in triglycerides also  Lab Results  Component Value Date   CHOL 158 12/20/2014   HDL 49.40 12/20/2014   LDLCALC 77 12/20/2014   LDLDIRECT 97.7 02/11/2014   TRIG 158.0* 12/20/2014   CHOLHDL 3 12/20/2014      Examination:   BP 120/80 mmHg  Pulse 96  Temp(Src) 97.8 F (36.6 C)  Resp 16  Ht '5\' 9"'$  (1.753 m)  Wt 247 lb (112.038 kg)  BMI 36.46 kg/m2  SpO2 97%  Body mass index is 36.46 kg/(m^2).   No pedal edema  Assessment/Plan:   Diabetes type 2, uncontrolled  She has had poor control with A1c 8.3% and has been persistently high See history of present illness for detailed discussion of her current management, blood sugar patterns and problems identified  Previously had been recommended various options such as the V-go pump, Victoza or other GLP-1 drugs but had been reluctant to do so Given her difficulty with compliance and needing relatively large amounts of insulin she is a good candidate for the V-go pump but she refuses to consider anything attached to her skin She also does not think she wants to try Victoza even though  she had done this without side effects for sometime She has done some more regular exercise recently but is not able to comply with lifestyle changes consistently especially diet  After much discussion she agrees to try Bydureon weekly and discussed how this works and how it would affect her sugars over time and lesser potential for nausea than Victoza She will need to take Humalog more consistently with meals Also needs better regimen of monitoring nonfasting to help post prandial control For now she can increase her Toujeo up 2 units and could back when blood sugars improved Bydureon  Hypertension: Blood pressure is normal and she can continue the same  Counseling time on subjects discussed above is over 50% of today's 25 minute visit   Patient Instructions  44 Toujeo  Check blood sugars on waking up .Marland Kitchen3-4  .Marland Kitchen times a week Also check blood sugars about 2 hours after a meal and do this after different meals by rotation Recommended blood sugar levels on waking up is 90-130 and about 2 hours after meal is 140-180 Please bring blood sugar monitor to each visit.  Bydureon weekly     Lynze Reddy 12/23/2014, 3:05 PM

## 2014-12-23 NOTE — Patient Instructions (Signed)
44 Toujeo  Check blood sugars on waking up .Marland Kitchen3-4  .Marland Kitchen times a week Also check blood sugars about 2 hours after a meal and do this after different meals by rotation Recommended blood sugar levels on waking up is 90-130 and about 2 hours after meal is 140-180 Please bring blood sugar monitor to each visit.  Bydureon weekly

## 2015-01-15 ENCOUNTER — Other Ambulatory Visit: Payer: Self-pay | Admitting: Endocrinology

## 2015-02-03 ENCOUNTER — Encounter: Payer: Self-pay | Admitting: Endocrinology

## 2015-02-03 ENCOUNTER — Ambulatory Visit (INDEPENDENT_AMBULATORY_CARE_PROVIDER_SITE_OTHER): Payer: BLUE CROSS/BLUE SHIELD | Admitting: Endocrinology

## 2015-02-03 VITALS — BP 138/94 | HR 69 | Temp 97.7°F | Resp 16 | Ht 69.0 in | Wt 249.6 lb

## 2015-02-03 DIAGNOSIS — I10 Essential (primary) hypertension: Secondary | ICD-10-CM

## 2015-02-03 DIAGNOSIS — IMO0002 Reserved for concepts with insufficient information to code with codable children: Secondary | ICD-10-CM

## 2015-02-03 DIAGNOSIS — E1165 Type 2 diabetes mellitus with hyperglycemia: Secondary | ICD-10-CM

## 2015-02-03 NOTE — Patient Instructions (Addendum)
Toujeo 26 units in am and 22 at dinner  Keep supper glucose <140 based on am Toujeo dose  At least 1 unit per 10 gm carb meal and 50% more insulin for hi fat meals  Check blood sugars on waking up .Marland Kitchen3-4  .Marland Kitchen times a week Also check blood sugars about 2 hours after a meal and do this after different meals by rotation Recommended blood sugar levels on waking up is 90-130 and about 2 hours after meal is 140-180 Please bring blood sugar monitor to each visit.

## 2015-02-03 NOTE — Progress Notes (Signed)
Patient ID: Alyssa Ross, female   DOB: 1966-05-05, 49 y.o.   MRN: [id number]   Reason for Appointment: Diabetes follow-up   History of Present Illness   Diagnosis: Type 2 DIABETES MELITUS, date of diagnosis 2005   She had previously been on metformin and Amaryl Because of inadequate control she was given Victoza in addition in 2011 With this her blood sugars have been significantly better and her A1c has been as low as 6.2 in 2013 Her blood sugars have been more difficult to control since 2014 an A1c consistently over 7% She  was started on insulin with Levemir in 06/2013 because of  increase in her A1c to 8.5% She had previously been taking Victoza along with Amaryl and metformin without consistent control Also had difficulty losing weight despite taking 1.8 mg Victoza, also had tried the 3 mg dose Because of episodic nausea and diarrhea in 1/16 she went off her Victoza  RECENT history:   INSULIN dose: 22 units Toujeo at breakfast and bedtime, Humalog 4-6  at some meals  She has difficulty getting her blood sugars under control and is usually reluctant to make changes in her management or lifestyle A1c has been consistently over 8% On her last visit she was instructed on starting Bydureon for potential benefit and she had claimed she has having nausea with Victoza but she did not start this because of the difficulty using the pen; probably she got the older version of the device.  Current blood sugars and problems identified as follows  Her fasting blood sugars have been consistently high except 2 or 3 times and averaging 175  She appears to have overall highest sugars after lunch; she now says that she takes less insulin to cover her lunch meal or none because she thinks this will be better covered after doing her exercise midday  Blood sugars after supper are consistently high  Her overall blood sugars are averaging 195+/-46  She claims that she is getting HYPOGLYCEMIA  with taking the Toujeo with  single dose and is back to taking 2 separate doses  Still not losing any weight  She again has difficulty watching her diet with eating sweets sometimes and not controlling portions, also not losing weight  Oral hypoglycemic drugs: Amaryl '4mg'$  hs and metformin 2250 mg       Side effects from medications: None  Monitors blood glucose: Once or twice a day   Glucometer: One Touch.          Blood Glucose readings from monitor download are detailed as above  Mean values apply above for all meters except median for One Touch  Mean values apply above for all meters except median for One Touch  PRE-MEAL Fasting Lunch Dinner Bedtime Overall  Glucose range:  106-210     185-246   89-300   Mean/median:  176     195   198+/-46    POST-MEAL PC Breakfast PC Lunch PC Dinner  Glucose range:  182,-253   144-300    Mean/median:   228     Meals: 3 meals per day. Dinner 8-9 pm    DIET: As above, has been unable to consistently control higher carb foods and sweets; tries to reduce portions Physical activity: exercise:  Recently 2-4 days a week to the gym for 45 minutes              Wt Readings from Last 3 Encounters:  02/03/15 249 lb 9.6 oz (113.218 kg)  12/23/14 247 lb (112.038 kg)  09/23/14 248 lb 9.6 oz (112.764 kg)    LABS:  Lab Results  Component Value Date   HGBA1C 8.3* 12/20/2014   HGBA1C 8.1* 09/20/2014   HGBA1C 9.4* 05/31/2014   Lab Results  Component Value Date   MICROALBUR <0.7 12/20/2014   LDLCALC 77 12/20/2014   CREATININE 0.89 12/20/2014    No visits with results within 1 Week(s) from this visit. Latest known visit with results is:  Lab on 12/20/2014  Component Date Value Ref Range Status  . Hgb A1c MFr Bld 12/20/2014 8.3* 4.6 - 6.5 % Final   Glycemic Control Guidelines for People with Diabetes:Non Diabetic:  <6%Goal of Therapy: <7%Additional Action Suggested:  >8%   . Sodium 12/20/2014 137  135 - 145 mEq/L Final  . Potassium 12/20/2014 3.8   3.5 - 5.1 mEq/L Final  . Chloride 12/20/2014 100  96 - 112 mEq/L Final  . CO2 12/20/2014 30  19 - 32 mEq/L Final  . Glucose, Bld 12/20/2014 141* 70 - 99 mg/dL Final  . BUN 12/20/2014 17  6 - 23 mg/dL Final  . Creatinine, Ser 12/20/2014 0.89  0.40 - 1.20 mg/dL Final  . Total Bilirubin 12/20/2014 0.4  0.2 - 1.2 mg/dL Final  . Alkaline Phosphatase 12/20/2014 56  39 - 117 U/L Final  . AST 12/20/2014 18  0 - 37 U/L Final  . ALT 12/20/2014 16  0 - 35 U/L Final  . Total Protein 12/20/2014 7.9  6.0 - 8.3 g/dL Final  . Albumin 12/20/2014 4.1  3.5 - 5.2 g/dL Final  . Calcium 12/20/2014 9.9  8.4 - 10.5 mg/dL Final  . GFR 12/20/2014 86.76  >60.00 mL/min Final  . Cholesterol 12/20/2014 158  0 - 200 mg/dL Final   ATP III Classification       Desirable:  < 200 mg/dL               Borderline High:  200 - 239 mg/dL          High:  > = 240 mg/dL  . Triglycerides 12/20/2014 158.0* 0.0 - 149.0 mg/dL Final   Normal:  <150 mg/dLBorderline High:  150 - 199 mg/dL  . HDL 12/20/2014 49.40  >39.00 mg/dL Final  . VLDL 12/20/2014 31.6  0.0 - 40.0 mg/dL Final  . LDL Cholesterol 12/20/2014 77  0 - 99 mg/dL Final  . Total CHOL/HDL Ratio 12/20/2014 3   Final                  Men          Women1/2 Average Risk     3.4          3.3Average Risk          5.0          4.42X Average Risk          9.6          7.13X Average Risk          15.0          11.0                      . NonHDL 12/20/2014 108.60   Final   NOTE:  Non-HDL goal should be 30 mg/dL higher than patient's LDL goal (i.e. LDL goal of < 70 mg/dL, would have non-HDL goal of < 100 mg/dL)  . Microalb, Ur 12/20/2014 <0.7  0.0 - 1.9 mg/dL Final  .  Creatinine,U 12/20/2014 50.4   Final  . Microalb Creat Ratio 12/20/2014 1.4  0.0 - 30.0 mg/g Final      Medication List       This list is accurate as of: 02/03/15 11:59 PM.  Always use your most recent med list.               CRESTOR 20 MG tablet  Generic drug:  rosuvastatin  TAKE 1 TABLET BY MOUTH EVERY DAY       Exenatide ER 2 MG Pen  Inject into the skin. Inject contents of one pen once a week     glucose blood test strip  Commonly known as:  ONE TOUCH ULTRA TEST  USE AS INSTRUCTED TO CHECK BLOOD SUGAR 2 TIMES PER DAY DX code E11.65     HUMALOG KWIKPEN 100 UNIT/ML KiwkPen  Generic drug:  insulin lispro  INJECT 8 UNITS INTO THE SKIN AT BEDTIME     Insulin Pen Needle 31G X 8 MM Misc  Commonly known as:  B-D ULTRAFINE III SHORT PEN  USE 3 NEEDLES PER DAY     lisinopril-hydrochlorothiazide 10-12.5 MG per tablet  Commonly known as:  PRINZIDE,ZESTORETIC  Take 1 tablet by mouth daily. Takes 1 tablet Monday, Wednesday and Friday     metFORMIN 750 MG 24 hr tablet  Commonly known as:  GLUCOPHAGE-XR  Take 3 tablets (2,250 mg total) by mouth daily.     ONE TOUCH DELICA LANCING DEV Misc  Use to check blood sugar 2 times per day     ONETOUCH DELICA LANCETS 08X Misc  USE TO CHECK BLOOD SUGARS 2 TIMES PER DAY DX CODE E11.65     potassium chloride SA 20 MEQ tablet  Commonly known as:  K-DUR,KLOR-CON  Take 1 tablet (20 mEq total) by mouth daily.     TOUJEO SOLOSTAR 300 UNIT/ML Sopn  Generic drug:  Insulin Glargine  INJECT 40 UNITS INTO THE SKIN EVERY DAY     Vitamin D (Ergocalciferol) 50000 UNITS Caps capsule  Commonly known as:  DRISDOL  Take 50,000 Units by mouth 2 (two) times a week.        Allergies: No Known Allergies  Past Medical History  Diagnosis Date  . 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 ASCUS (atypical squamous cells of undetermined significance) on Pap smear 08/06/1999  . Breast mass in female 2002    Left  . Yeast vaginitis 2006  . Fibroid uterus 2010  . Irregular bleeding 2011  . Diabetes mellitus without complication   . Hypertension     Past Surgical History  Procedure Laterality Date  . No past surgeries    . Dilatation & curettage/hysteroscopy with trueclear N/A 01/20/2014    Procedure: DILATATION & CURETTAGE/HYSTEROSCOPY WITH  TRUCLEAR;  Surgeon: [name], MD;  Location: [facility];  Service: Gynecology;  Laterality: N/A;  . Dilatation & currettage/hysteroscopy with resectocope N/A 01/20/2014    Procedure: DILATATION & CURETTAGE/HYSTEROSCOPY WITH RESECTOCOPE;  Surgeon: [name], MD;  Location: [facility];  Service: Gynecology;  Laterality: N/A;    Family History  Problem Relation Age of Onset  . Diabetes Mother   . Diabetes Father   . Mental retardation Sister     Social History:  reports that she has never smoked. She has never used smokeless tobacco. She reports that she drinks alcohol. She reports that she does not use illicit drugs.  REVIEW of systems:  She is being followed by her PCP for hypertension.  Blood pressure is well controlled with Zestoretic usually but she appears anxious today  Has had hypercholesterolemia, taking Crestor with good control, has mild increase in triglycerides also  Lab Results  Component Value Date   CHOL 158 12/20/2014   HDL 49.40 12/20/2014   LDLCALC 77 12/20/2014   LDLDIRECT 97.7 02/11/2014   TRIG 158.0* 12/20/2014   CHOLHDL 3 12/20/2014      Examination:   BP 138/94 mmHg  Pulse 69  Temp(Src) 97.7 F (36.5 C)  Resp 16  Ht '5\' 9"'$  (1.753 m)  Wt 249 lb 9.6 oz (113.218 kg)  BMI 36.84 kg/m2  SpO2 96%  Body mass index is 36.84 kg/(m^2).   No pedal edema  Assessment/Plan:   Diabetes type 2, uncontrolled  She has had poor control with last A1c 8.3% and this has been persistently high See history of present illness for detailed discussion of her current management, blood sugar patterns and problems identified  Previously had been recommended various options such as the V-go pump and Bydureon but she refuses to do these things Also she is not wanting to increase insulin because of fear of weight gain Although she is doing some exercise and trying to take more insulin with meals she is not taking enough insulin overall  Discussed that she is getting  insulin deficient and needs to take full replacement doses of insulin and for her weight of 113 kg she is not taking enough She prefers to take Toujeo twice a day as she thinks it causes hypoglycemic single dose; may continue this but will need to increase her morning dose to get her before supper readings down to target She also is a candidate for trying Tyler Aas Discussed how this would be different  Again recommended that she try Bydureon as this will be gradual in onset of action and showed her how to use the new pen Pharmacy was contacted about the new pen being given  She also needs to take more consistent Humalog with meals and larger doses at lunch to keep blood sugars under 180 PC  Counseling time on subjects discussed above is over 50% of today's 25 minute visit   Patient Instructions  Toujeo 26 units in am and 22 at dinner  Keep supper glucose <140 based on am Toujeo dose  At least 1 unit per 10 gm carb meal and 50% more insulin for hi fat meals  Check blood sugars on waking up .Marland Kitchen3-4  .Marland Kitchen times a week Also check blood sugars about 2 hours after a meal and do this after different meals by rotation Recommended blood sugar levels on waking up is 90-130 and about 2 hours after meal is 140-180 Please bring blood sugar monitor to each visit.          Alyssa Ross 02/05/2015, 2:26 PM   Note: This office note was prepared with Dragon voice recognition system technology. Any transcriptional errors that result from this process are unintentional.

## 2015-02-08 ENCOUNTER — Other Ambulatory Visit: Payer: Self-pay | Admitting: Endocrinology

## 2015-02-27 ENCOUNTER — Other Ambulatory Visit: Payer: Self-pay | Admitting: Endocrinology

## 2015-03-20 ENCOUNTER — Other Ambulatory Visit: Payer: Self-pay | Admitting: Endocrinology

## 2015-03-28 ENCOUNTER — Other Ambulatory Visit: Payer: Self-pay | Admitting: Endocrinology

## 2015-03-31 ENCOUNTER — Telehealth: Payer: Self-pay | Admitting: Endocrinology

## 2015-03-31 ENCOUNTER — Other Ambulatory Visit (INDEPENDENT_AMBULATORY_CARE_PROVIDER_SITE_OTHER): Payer: BLUE CROSS/BLUE SHIELD

## 2015-03-31 ENCOUNTER — Other Ambulatory Visit: Payer: Self-pay | Admitting: *Deleted

## 2015-03-31 DIAGNOSIS — E1165 Type 2 diabetes mellitus with hyperglycemia: Secondary | ICD-10-CM | POA: Diagnosis not present

## 2015-03-31 DIAGNOSIS — IMO0002 Reserved for concepts with insufficient information to code with codable children: Secondary | ICD-10-CM

## 2015-03-31 LAB — BASIC METABOLIC PANEL
BUN: 18 mg/dL (ref 6–23)
CALCIUM: 10.3 mg/dL (ref 8.4–10.5)
CO2: 31 mEq/L (ref 19–32)
Chloride: 98 mEq/L (ref 96–112)
Creatinine, Ser: 0.89 mg/dL (ref 0.40–1.20)
GFR: 86.66 mL/min (ref >60.00)
Glucose, Bld: 118 mg/dL — ABNORMAL HIGH (ref 70–99)
Potassium: 3.9 mEq/L (ref 3.5–5.1)
SODIUM: 137 meq/L (ref 135–145)

## 2015-03-31 LAB — HEMOGLOBIN A1C: HEMOGLOBIN A1C: 8 % — AB (ref 4.6–6.5)

## 2015-03-31 MED ORDER — OMEPRAZOLE 20 MG PO CPDR: 20.0000 mg | DELAYED_RELEASE_CAPSULE | Freq: Every day | ORAL | Status: DC

## 2015-03-31 NOTE — Telephone Encounter (Signed)
Noted, rx is sent, patient has appt on Wednesday

## 2015-03-31 NOTE — Telephone Encounter (Signed)
Please call pt back regarding indigestion thinking its coming from byduron, she is considering not taking dose tonight

## 2015-03-31 NOTE — Telephone Encounter (Signed)
Patient is complaining of indigestion from the bydureon, she also says she is getting knots where ever she injects.  She said it's just like it was with the Victoza

## 2015-03-31 NOTE — Telephone Encounter (Signed)
If she is having heartburn we can call in prescription for Prilosec 20 mg daily

## 2015-04-05 ENCOUNTER — Encounter: Payer: Self-pay | Admitting: Endocrinology

## 2015-04-05 ENCOUNTER — Ambulatory Visit (INDEPENDENT_AMBULATORY_CARE_PROVIDER_SITE_OTHER): Payer: BLUE CROSS/BLUE SHIELD | Admitting: Endocrinology

## 2015-04-05 VITALS — BP 136/92 | HR 94 | Temp 98.5°F | Resp 14 | Ht 69.0 in | Wt 240.4 lb

## 2015-04-05 DIAGNOSIS — E1165 Type 2 diabetes mellitus with hyperglycemia: Secondary | ICD-10-CM | POA: Diagnosis not present

## 2015-04-05 DIAGNOSIS — Z794 Long term (current) use of insulin: Secondary | ICD-10-CM | POA: Diagnosis not present

## 2015-04-05 MED ORDER — PROCHLORPERAZINE MALEATE 10 MG PO TABS: 10.0000 mg | ORAL_TABLET | Freq: Four times a day (QID) | ORAL | Status: DC | PRN

## 2015-04-05 MED ORDER — INSULIN DEGLUDEC 100 UNIT/ML ~~LOC~~ SOPN: 40.0000 [IU] | PEN_INJECTOR | Freq: Every day | SUBCUTANEOUS | Status: DC

## 2015-04-05 NOTE — Progress Notes (Signed)
Patient ID: Alyssa Ross, female   DOB: 1966/04/30, 49 y.o.   MRN: [id number]   Reason for Appointment: Diabetes follow-up   History of Present Illness   Diagnosis: Type 2 DIABETES MELITUS, date of diagnosis 2005   She had previously been on metformin and Amaryl Because of inadequate control she was given Victoza in addition in 2011 With this her blood sugars have been significantly better and her A1c has been as low as 6.2 in 2013 Her blood sugars have been more difficult to control since 2014 an A1c consistently over 7% She  was started on insulin with Levemir in 06/2013 because of  increase in her A1c to 8.5% She had previously been taking Victoza along with Amaryl and metformin without consistent control Also had difficulty losing weight despite taking 1.8 mg Victoza, also had tried the 3 mg dose Because of episodic nausea and diarrhea in 1/16 she went off her Victoza  RECENT history:   INSULIN dose: 22 units Toujeo at breakfast and bedtime none recently, previously was on, Humalog 4-6  at some meals  She has difficulty getting her blood sugars under control and is usually reluctant to make changes in her management or lifestyle A1c has been consistently over 8% On her last visit she was started on Bydureon which she thinks she has taken for about 9 weeks now With this her blood sugars appear to be improving although she does complain of having nausea for at least 3 days after the injection Also does not like the large size of the needle as also the nodules in the skin that are persisting Unable to download her meter today and not clear what exactly her blood sugar patterns are  Current blood sugars and problems identified as follows  Her fasting blood sugars have been overall much better controlled but has not checked many readings, previously were averaging 175  She appears to have overall highest sugars after lunch again and this is based on what she is eating.  She  has not taken any Humalog at all and does not carry this with her when she is out during the day  She has only 1 or 2 readings late at night which do not appear to be high recently  Her blood sugars are still averaging about 150-160 but is not getting as many extreme readings over 200 as before  She again thinks that her blood sugar will get low if she takes more than about 22 units of Toujeo at the time  She has reduced her Toujeo to 20 units, previously on 22  Sometimes she will forget to take her to Toujeo at bedtime  She says that she does not have any time to exercise  Oral hypoglycemic drugs: Amaryl 32m in a.m. and metformin 2250 mg       Side effects from medications: None  Monitors blood glucose: Once or twice a day   Glucometer: One Touch.          Blood Glucose readings from monitor Review are as below  Before meal average 160, overall 14 day average 159  PRE-MEAL Fasting Lunch  afternoon  Bedtime Overall  Glucose range: 120-173  194, 226, 254 159   Mean/median:         Meals: 3 meals per day. Dinner 8-9 pm    DIET: As above, has been unable to consistently control higher carb foods and sweets; tries to reduce portions Physical activity: exercise:  was 2-4 days a  week to the gym for 45 minutes              Wt Readings from Last 3 Encounters:  04/05/15 240 lb 6.4 oz (109.045 kg)  02/03/15 249 lb 9.6 oz (113.218 kg)  12/23/14 247 lb (112.038 kg)    LABS:  Lab Results  Component Value Date   HGBA1C 8.0* 03/31/2015   HGBA1C 8.3* 12/20/2014   HGBA1C 8.1* 09/20/2014   Lab Results  Component Value Date   MICROALBUR <0.7 12/20/2014   LDLCALC 77 12/20/2014   CREATININE 0.89 03/31/2015    Appointment on 03/31/2015  Component Date Value Ref Range Status  . Hgb A1c MFr Bld 03/31/2015 8.0* 4.6 - 6.5 % Final   Glycemic Control Guidelines for People with Diabetes:Non Diabetic:  <6%Goal of Therapy: <7%Additional Action Suggested:  >8%   . Sodium 03/31/2015 137  135 -  145 mEq/L Final  . Potassium 03/31/2015 3.9  3.5 - 5.1 mEq/L Final  . Chloride 03/31/2015 98  96 - 112 mEq/L Final  . CO2 03/31/2015 31  19 - 32 mEq/L Final  . Glucose, Bld 03/31/2015 118* 70 - 99 mg/dL Final  . BUN 03/31/2015 18  6 - 23 mg/dL Final  . Creatinine, Ser 03/31/2015 0.89  0.40 - 1.20 mg/dL Final  . Calcium 03/31/2015 10.3  8.4 - 10.5 mg/dL Final  . GFR 03/31/2015 86.66  >60.00 mL/min Final      Medication List       This list is accurate as of: 04/05/15  8:33 PM.  Always use your most recent med list.               Exenatide ER 2 MG Pen  Inject into the skin. Inject contents of one pen once a week     glucose blood test strip  Commonly known as:  ONE TOUCH ULTRA TEST  USE AS INSTRUCTED TO CHECK BLOOD SUGAR 2 TIMES PER DAY DX code E11.65     HUMALOG KWIKPEN 100 UNIT/ML KiwkPen  Generic drug:  insulin lispro  INJECT 8 UNITS INTO THE SKIN AT BEDTIME     Insulin Pen Needle 31G X 8 MM Misc  Commonly known as:  B-D ULTRAFINE III SHORT PEN  USE 3 NEEDLES PER DAY     lisinopril-hydrochlorothiazide 10-12.5 MG tablet  Commonly known as:  PRINZIDE,ZESTORETIC  Take 1 tablet by mouth daily. Takes 1 tablet Monday, Wednesday and Friday     metFORMIN 750 MG 24 hr tablet  Commonly known as:  GLUCOPHAGE-XR  TAKE 3 TABLETS (2,250 MG TOTAL) DAILY     omeprazole 20 MG capsule  Commonly known as:  PRILOSEC  Take 1 capsule (20 mg total) by mouth daily.     ONE TOUCH DELICA LANCING DEV Misc  Use to check blood sugar 2 times per day     ONETOUCH DELICA LANCETS 49F Misc  USE TO CHECK BLOOD SUGARS 2 TIMES PER DAY DX CODE E11.65     potassium chloride SA 20 MEQ tablet  Commonly known as:  K-DUR,KLOR-CON  TAKE 1 TABLET DAILY     prochlorperazine 10 MG tablet  Commonly known as:  COMPAZINE  Take 1 tablet (10 mg total) by mouth every 6 (six) hours as needed for nausea or vomiting.     rosuvastatin 20 MG tablet  Commonly known as:  CRESTOR  TAKE ONE TABLET BY MOUTH DAILY       TOUJEO SOLOSTAR 300 UNIT/ML Sopn  Generic drug:  Insulin Glargine  INJECT 40 UNITS UNDER THE SKIN EVERY DAY     Vitamin D (Ergocalciferol) 50000 UNITS Caps capsule  Commonly known as:  DRISDOL  Take 50,000 Units by mouth 2 (two) times a week.        Allergies: No Known Allergies  Past Medical History  Diagnosis Date  . 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 ASCUS (atypical squamous cells of undetermined significance) on Pap smear 08/06/1999  . Breast mass in female 2002    Left  . Yeast vaginitis 2006  . Fibroid uterus 2010  . Irregular bleeding 2011  . Diabetes mellitus without complication ([place])   . Hypertension     Past Surgical History  Procedure Laterality Date  . No past surgeries    . Dilatation & curettage/hysteroscopy with trueclear N/A 01/20/2014    Procedure: DILATATION & CURETTAGE/HYSTEROSCOPY WITH TRUCLEAR;  Surgeon: [name], MD;  Location: [facility];  Service: Gynecology;  Laterality: N/A;  . Dilatation & currettage/hysteroscopy with resectocope N/A 01/20/2014    Procedure: DILATATION & CURETTAGE/HYSTEROSCOPY WITH RESECTOCOPE;  Surgeon: [name], MD;  Location: [facility];  Service: Gynecology;  Laterality: N/A;    Family History  Problem Relation Age of Onset  . Diabetes Mother   . Diabetes Father   . Mental retardation Sister     Social History:  reports that she has never smoked. She has never used smokeless tobacco. She reports that she drinks alcohol. She reports that she does not use illicit drugs.  REVIEW of systems:  She is being followed by her PCP for hypertension.  Blood pressure is again high and she is due for follow-up with PCP soon  Has had hypercholesterolemia, taking Crestor with good control, has mild increase in triglycerides also  Lab Results  Component Value Date   CHOL 158 12/20/2014   HDL 49.40 12/20/2014   LDLCALC 77 12/20/2014   LDLDIRECT 97.7 02/11/2014   TRIG 158.0* 12/20/2014    CHOLHDL 3 12/20/2014      Examination:   BP 136/92 mmHg  Pulse 94  Temp(Src) 98.5 F (36.9 C)  Resp 14  Ht 5' 9"  (1.753 m)  Wt 240 lb 6.4 oz (109.045 kg)  BMI 35.48 kg/m2  SpO2 95%  Body mass index is 35.48 kg/(m^2).    Assessment/Plan:   Diabetes type 2, uncontrolled  She has had poor control with A1c consistently over 8% See history of present illness for detailed discussion of her current management, blood sugar patterns and problems identified  Recently her blood sugars appear to be improving significantly overall with less fluctuation This is likely to be from using Bydureon She is complaining about nausea, subcutaneous nodules and large-size needle but did not tolerate Victoza previously Tanzeum is not covered by insurance and also Trulicity For now will have her take Compazine as needed for nausea Unable to download her monitor and detailed blood sugar patterns and is not available  She probably has better fasting blood sugars now but periodic high readings after meals especially lunch depending on her intake. She has reduced her weight with increase satiety from the Bydureon However did not continue taking Humalog as mealtime coverage for her larger meals  She prefers to take Toujeo twice a day as she thinks it causes hypoglycemic single dose However will have her try TRESIBA on her next prescription for better overall control over 24 hours and 1 day convenience Showed her how to use this and patient information Kit given She also needs  to take more consistent Humalog with meals that have higher carbohydrates including at lunch to keep blood sugars under 180 PC  Reminded her to restart exercise although she thinks that she is too busy to do this again Also consider replacing her glucose monitor which cannot be tolerated today  HYPERTENSION: Not controlled and she needs to follow-up with PCP  Counseling time on subjects discussed above is over 50% of today's 25  minute visit   Patient Instructions  Check blood sugars on waking up 3-4  times a week Also check blood sugars about 2 hours after a meal and do this after different meals by rotation  Recommended blood sugar levels on waking up is 90-130 and about 2 hours after meal is 130-160  Please bring your blood sugar monitor to each visit, thank you  Tresiba replace Toujeo, start with with 35 once daily  Restart exercise     [facility] 04/05/2015, 8:33 PM   Note: This office note was prepared with Dragon voice recognition system technology. Any transcriptional errors that result from this process are unintentional.

## 2015-04-05 NOTE — Patient Instructions (Addendum)
Check blood sugars on waking up 3-4  times a week Also check blood sugars about 2 hours after a meal and do this after different meals by rotation  Recommended blood sugar levels on waking up is 90-130 and about 2 hours after meal is 130-160  Please bring your blood sugar monitor to each visit, thank you  Tresiba replace Toujeo, start with with 35 once daily  Restart exercise

## 2015-05-28 ENCOUNTER — Other Ambulatory Visit: Payer: Self-pay | Admitting: Endocrinology

## 2015-06-01 ENCOUNTER — Other Ambulatory Visit (INDEPENDENT_AMBULATORY_CARE_PROVIDER_SITE_OTHER): Payer: BLUE CROSS/BLUE SHIELD

## 2015-06-01 DIAGNOSIS — E1165 Type 2 diabetes mellitus with hyperglycemia: Secondary | ICD-10-CM

## 2015-06-01 DIAGNOSIS — Z794 Long term (current) use of insulin: Secondary | ICD-10-CM

## 2015-06-01 LAB — BASIC METABOLIC PANEL
BUN: 20 mg/dL (ref 6–23)
CALCIUM: 10.1 mg/dL (ref 8.4–10.5)
CO2: 32 mEq/L (ref 19–32)
Chloride: 100 mEq/L (ref 96–112)
Creatinine, Ser: 0.89 mg/dL (ref 0.40–1.20)
GFR: 86.6 mL/min (ref >60.00)
Glucose, Bld: 129 mg/dL — ABNORMAL HIGH (ref 70–99)
Potassium: 3.8 mEq/L (ref 3.5–5.1)
SODIUM: 139 meq/L (ref 135–145)

## 2015-06-02 LAB — FRUCTOSAMINE: FRUCTOSAMINE: 288 umol/L — AB (ref 0–285)

## 2015-06-06 ENCOUNTER — Encounter: Payer: Self-pay | Admitting: Endocrinology

## 2015-06-06 ENCOUNTER — Ambulatory Visit (INDEPENDENT_AMBULATORY_CARE_PROVIDER_SITE_OTHER): Payer: BLUE CROSS/BLUE SHIELD | Admitting: Endocrinology

## 2015-06-06 ENCOUNTER — Other Ambulatory Visit: Payer: Self-pay | Admitting: *Deleted

## 2015-06-06 VITALS — BP 130/82 | HR 76 | Temp 98.2°F | Resp 14 | Ht 69.0 in | Wt 241.4 lb

## 2015-06-06 DIAGNOSIS — Z23 Encounter for immunization: Secondary | ICD-10-CM | POA: Diagnosis not present

## 2015-06-06 DIAGNOSIS — E1165 Type 2 diabetes mellitus with hyperglycemia: Secondary | ICD-10-CM

## 2015-06-06 DIAGNOSIS — Z794 Long term (current) use of insulin: Secondary | ICD-10-CM | POA: Diagnosis not present

## 2015-06-06 MED ORDER — INSULIN PEN NEEDLE 32G X 5 MM MISC
Status: DC
Start: 2015-06-06 — End: 2018-07-11

## 2015-06-06 MED ORDER — INSULIN LISPRO 100 UNIT/ML (KWIKPEN): PEN_INJECTOR | SUBCUTANEOUS | Status: DC

## 2015-06-06 NOTE — Progress Notes (Signed)
Patient ID: Alyssa Ross, female   DOB: 08-Jan-1966, 49 y.o.   MRN: [id number]   Reason for Appointment: Diabetes follow-up   History of Present Illness   Diagnosis: Type 2 DIABETES MELITUS, date of diagnosis 2005   She had previously been on metformin and Amaryl Because of inadequate control she was given Victoza in addition in 2011 With this her blood sugars have been significantly better and her A1c has been as low as 6.2 in 2013 Her blood sugars have been more difficult to control since 2014 an A1c consistently over 7% She  was started on insulin with Levemir in 06/2013 because of  increase in her A1c to 8.5% She had previously been taking Victoza along with Amaryl and metformin without consistent control Also had difficulty losing weight despite taking 1.8 mg Victoza, also had tried the 3 mg dose Because of episodic nausea and diarrhea in 1/16 she went off her Victoza  RECENT history:   INSULIN dose: TRESIBA 40 units am.  Humalog occasionally  She has difficulty getting her blood sugars under control and is usually reluctant to make changes in her management or be consistent with lifestyle A1c has been consistently over 8% and fructosamine is still relatively high  She had been on Bydureon since 8/16 but stopped it in November because of nausea With this her blood sugars did improve and she was eating less but control is not consistently better  Current blood sugars and problems identified as follows  Her fasting blood sugars have been overall fair with some variability and averages about 130  She has only sporadic readings after meals which appear to be mostly high  Most of her readings after supper are usually over 180  She still does not understand the need to take Humalog with meals including breakfast when she is eating carbohydrates and also does not always watch fat intake  She is still very reluctant to consider any kind of insulin pump device  Her weight  is stable  Oral hypoglycemic drugs: Amaryl '4mg'$  in a.m. and metformin 2250 mg       Side effects from medications: None  Monitors blood glucose: Once or twice a day   Glucometer: One Touch.          Blood Glucose readings from monitor download and analysis:  Mean values apply above for all meters except median for One Touch  PRE-MEAL Fasting Lunch Dinner Bedtime Overall  Glucose range:  108-213   179-240   101-325   170-326    Mean/median:  133     220   161     Meals: 3 meals per day. Dinner 8-9 pm    DIET: As above, has been unable to consistently control higher carb foods and sweets; tries to reduce portions Physical activity: exercise:   3-4 days a week to the gym for 45 minutes              Wt Readings from Last 3 Encounters:  06/06/15 241 lb 6.4 oz (109.498 kg)  04/05/15 240 lb 6.4 oz (109.045 kg)  02/03/15 249 lb 9.6 oz (113.218 kg)    LABS:  Lab Results  Component Value Date   HGBA1C 8.0* 03/31/2015   HGBA1C 8.3* 12/20/2014   HGBA1C 8.1* 09/20/2014   Lab Results  Component Value Date   MICROALBUR <0.7 12/20/2014   LDLCALC 77 12/20/2014   CREATININE 0.89 06/01/2015    Lab on 06/01/2015  Component Date Value Ref Range Status  .  Sodium 06/01/2015 139  135 - 145 mEq/L Final  . Potassium 06/01/2015 3.8  3.5 - 5.1 mEq/L Final  . Chloride 06/01/2015 100  96 - 112 mEq/L Final  . CO2 06/01/2015 32  19 - 32 mEq/L Final  . Glucose, Bld 06/01/2015 129* 70 - 99 mg/dL Final  . BUN 06/01/2015 20  6 - 23 mg/dL Final  . Creatinine, Ser 06/01/2015 0.89  0.40 - 1.20 mg/dL Final  . Calcium 06/01/2015 10.1  8.4 - 10.5 mg/dL Final  . GFR 06/01/2015 86.60  >60.00 mL/min Final  . Fructosamine 06/01/2015 288* 0 - 285 umol/L Final   Comment: Published reference interval for apparently healthy subjects between age 22 and 51 is 68 - 285 umol/L and in a poorly controlled diabetic population is 228 - 563 umol/L with a mean of 396 umol/L.       Medication List       This list  is accurate as of: 06/06/15  8:08 AM.  Always use your most recent med list.               Exenatide ER 2 MG Pen  Inject into the skin. Reported on 06/06/2015     glucose blood test strip  Commonly known as:  ONE TOUCH ULTRA TEST  USE AS INSTRUCTED TO CHECK BLOOD SUGAR 2 TIMES PER DAY DX code E11.65     HUMALOG KWIKPEN 100 UNIT/ML KiwkPen  Generic drug:  insulin lispro  INJECT 8 UNITS INTO THE SKIN AT BEDTIME     Insulin Degludec 100 UNIT/ML Sopn  Commonly known as:  TRESIBA FLEXTOUCH  Inject 40 Units into the skin daily.     Insulin Pen Needle 31G X 8 MM Misc  Commonly known as:  B-D ULTRAFINE III SHORT PEN  USE 3 NEEDLES PER DAY     lisinopril-hydrochlorothiazide 10-12.5 MG tablet  Commonly known as:  PRINZIDE,ZESTORETIC  Take 1 tablet by mouth daily. Takes 1 tablet Monday, Wednesday and Friday     metFORMIN 750 MG 24 hr tablet  Commonly known as:  GLUCOPHAGE-XR  TAKE 3 TABLETS (2,250 MG TOTAL) DAILY     omeprazole 20 MG capsule  Commonly known as:  PRILOSEC  Take 1 capsule (20 mg total) by mouth daily.     ONE TOUCH DELICA LANCING DEV Misc  Use to check blood sugar 2 times per day     ONETOUCH DELICA LANCETS 30Q Misc  USE TO CHECK BLOOD SUGARS 2 TIMES PER DAY DX CODE E11.65     potassium chloride SA 20 MEQ tablet  Commonly known as:  K-DUR,KLOR-CON  TAKE 1 TABLET DAILY     prochlorperazine 10 MG tablet  Commonly known as:  COMPAZINE  Take 1 tablet (10 mg total) by mouth every 6 (six) hours as needed for nausea or vomiting.     rosuvastatin 20 MG tablet  Commonly known as:  CRESTOR  TAKE ONE TABLET BY MOUTH DAILY     TOUJEO SOLOSTAR 300 UNIT/ML Sopn  Generic drug:  Insulin Glargine  INJECT 40 UNITS UNDER THE SKIN EVERY DAY     Vitamin D (Ergocalciferol) 50000 UNITS Caps capsule  Commonly known as:  DRISDOL  Take 50,000 Units by mouth 2 (two) times a week.        Allergies: No Known Allergies  Past Medical History  Diagnosis Date  . 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 ASCUS (atypical squamous cells of undetermined significance) on Pap smear 08/06/1999  .  Breast mass in female 2002    Left  . Yeast vaginitis 2006  . Fibroid uterus 2010  . Irregular bleeding 2011  . Diabetes mellitus without complication ([place])   . Hypertension     Past Surgical History  Procedure Laterality Date  . No past surgeries    . Dilatation & curettage/hysteroscopy with trueclear N/A 01/20/2014    Procedure: DILATATION & CURETTAGE/HYSTEROSCOPY WITH TRUCLEAR;  Surgeon: [name], MD;  Location: [facility];  Service: Gynecology;  Laterality: N/A;  . Dilatation & currettage/hysteroscopy with resectocope N/A 01/20/2014    Procedure: DILATATION & CURETTAGE/HYSTEROSCOPY WITH RESECTOCOPE;  Surgeon: [name], MD;  Location: [facility];  Service: Gynecology;  Laterality: N/A;    Family History  Problem Relation Age of Onset  . Diabetes Mother   . Diabetes Father   . Mental retardation Sister     Social History:  reports that she has never smoked. She has never used smokeless tobacco. She reports that she drinks alcohol. She reports that she does not use illicit drugs.  REVIEW of systems:  She is being followed by her PCP for hypertension.  Blood pressure is better  She thinks she has a 40 exam with her PCP annually  Has had hypercholesterolemia, taking Crestor with good control, has mild increase in triglycerides also  Lab Results  Component Value Date   CHOL 158 12/20/2014   HDL 49.40 12/20/2014   LDLCALC 77 12/20/2014   LDLDIRECT 97.7 02/11/2014   TRIG 158.0* 12/20/2014   CHOLHDL 3 12/20/2014      Examination:   Ht '5\' 9"'$  (1.753 m)  Wt 241 lb 6.4 oz (109.498 kg)  BMI 35.63 kg/m2  Body mass index is 35.63 kg/(m^2).    Assessment/Plan:   Diabetes type 2, uncontrolled  She has had poor control with A1c consistently over 8% See history of present illness for detailed discussion of her current  management, blood sugar patterns and problems identified  Recently her blood sugars are mostly high postprandially and occasionally higher in the morning also She thinks her sugars are reasonably good with fasting with now using [place] once a day compared to other insulins which she used twice a day She does not tolerate GLP-1 drugs and has not tried Tanzeum, is reluctant to try this also  She does not understand the need to control mealtime rising blood sugar which is keeping her overall average blood sugar high and postprandial readings as high as 325  Advised her to take HUMALOG with every meal list she is eating minimal amount of carbohydrate and fat Again discussed use of the V-go pump and how this is different but she is not willing to try get  HYPERTENSION:  controlled and she needs to follow-up with PCP regularly  Counseling time on subjects discussed above is over 50% of today's 25 minute visit   There are no Patient Instructions on file for this visit.   Alyssa Ross 06/06/2015, 8:08 AM   Note: This office note was prepared with Dragon voice recognition system technology. Any transcriptional errors that result from this process are unintentional.

## 2015-06-06 NOTE — Patient Instructions (Addendum)
Check blood sugars on waking up 4  times a week Also check blood sugars about 2 hours after a meal and do this after different meals by rotation  Recommended blood sugar levels on waking up is 90-130 and about 2 hours after meal is 130-160  Please bring your blood sugar monitor to each visit, thank you  Take Humalog for all meals unless 0 carbs and fat  Take 40 units of Tresiba for now and may need to go up or down  2 units if morning sugar is consistently outside above range

## 2015-07-06 ENCOUNTER — Telehealth: Payer: Self-pay | Admitting: Endocrinology

## 2015-07-06 NOTE — Telephone Encounter (Signed)
Patient called stating that this morning her blood sugar had dropped to 46 She ate and drank and checked 1 hour later 139 She started feeling very jittery and rechecked now 95  Insulin: Treshiba   Please advise

## 2015-07-06 NOTE — Telephone Encounter (Signed)
She got low an hour after the injection in the morning.  She does not eat until she gets to work.  Otherwise fasting glucoses are 100-130 She will take the Tresiba insulin at suppertime instead, discussed with patient

## 2015-07-06 NOTE — Telephone Encounter (Signed)
Need all blood sugar readings for the last 3-5 days including after dinner

## 2015-07-06 NOTE — Telephone Encounter (Signed)
She is here with her mom now, she didn't know when I called her what her sugars had been, she did say today is the only day they did this.

## 2015-07-06 NOTE — Telephone Encounter (Signed)
Please see below.

## 2015-08-02 ENCOUNTER — Other Ambulatory Visit (INDEPENDENT_AMBULATORY_CARE_PROVIDER_SITE_OTHER): Payer: BLUE CROSS/BLUE SHIELD

## 2015-08-02 DIAGNOSIS — E1165 Type 2 diabetes mellitus with hyperglycemia: Secondary | ICD-10-CM

## 2015-08-02 DIAGNOSIS — Z794 Long term (current) use of insulin: Secondary | ICD-10-CM

## 2015-08-02 LAB — COMPREHENSIVE METABOLIC PANEL
ALT: 14 U/L (ref 0–35)
AST: 13 U/L (ref 0–37)
Albumin: 4.2 g/dL (ref 3.5–5.2)
Alkaline Phosphatase: 52 U/L (ref 39–117)
BUN: 27 mg/dL — AB (ref 6–23)
CHLORIDE: 101 meq/L (ref 96–112)
CO2: 31 mEq/L (ref 19–32)
CREATININE: 0.92 mg/dL (ref 0.40–1.20)
Calcium: 9.6 mg/dL (ref 8.4–10.5)
GFR: 83.29 mL/min (ref >60.00)
GLUCOSE: 134 mg/dL — AB (ref 70–99)
POTASSIUM: 3.8 meq/L (ref 3.5–5.1)
SODIUM: 138 meq/L (ref 135–145)
TOTAL PROTEIN: 7.5 g/dL (ref 6.0–8.3)
Total Bilirubin: 0.4 mg/dL (ref 0.2–1.2)

## 2015-08-02 LAB — HEMOGLOBIN A1C: HEMOGLOBIN A1C: 7.9 % — AB (ref 4.6–6.5)

## 2015-08-07 ENCOUNTER — Encounter: Payer: BLUE CROSS/BLUE SHIELD | Attending: Endocrinology | Admitting: Nutrition

## 2015-08-07 ENCOUNTER — Encounter: Payer: Self-pay | Admitting: Endocrinology

## 2015-08-07 ENCOUNTER — Ambulatory Visit: Payer: BLUE CROSS/BLUE SHIELD | Admitting: Endocrinology

## 2015-08-07 ENCOUNTER — Ambulatory Visit (INDEPENDENT_AMBULATORY_CARE_PROVIDER_SITE_OTHER): Payer: BLUE CROSS/BLUE SHIELD | Admitting: Endocrinology

## 2015-08-07 VITALS — BP 122/82 | HR 72 | Temp 97.8°F | Resp 16 | Ht 69.0 in | Wt 250.4 lb

## 2015-08-07 DIAGNOSIS — Z794 Long term (current) use of insulin: Secondary | ICD-10-CM | POA: Insufficient documentation

## 2015-08-07 DIAGNOSIS — E1165 Type 2 diabetes mellitus with hyperglycemia: Secondary | ICD-10-CM

## 2015-08-07 MED ORDER — ALBIGLUTIDE 30 MG ~~LOC~~ PEN
PEN_INJECTOR | SUBCUTANEOUS | Status: DC
Start: 2015-08-07 — End: 2015-10-02

## 2015-08-07 NOTE — Progress Notes (Signed)
Patient ID: Alyssa Ross, female   DOB: [birth year], 50 y.o.   MRN: [id number]   Reason for Appointment: Diabetes follow-up   History of Present Illness   Diagnosis: Type 2 DIABETES MELITUS, date of diagnosis 2005   Alyssa Ross had previously been on metformin and Amaryl Because of inadequate control Alyssa Ross was given Victoza in addition in 2011 With this her blood sugars have been significantly better and her A1c has been as low as 6.2 in 2013 Her blood sugars have been more difficult to control since 2014 an A1c consistently over 7% Alyssa Ross  was started on insulin with Levemir in 06/2013 because of  increase in her A1c to 8.5% Alyssa Ross had previously been taking Victoza along with Amaryl and metformin without consistent control Also had difficulty losing weight despite taking 1.8 mg Victoza, also had tried the 3 mg dose Because of episodic nausea and diarrhea in 1/16 Alyssa Ross went off her Victoza  RECENT history:   INSULIN dose: TRESIBA 40 units pm.  Humalog 10-12 units irregularly at suppertime  Alyssa Ross has difficulty getting her blood sugars under control and is again is reluctant to make changes in her management or be consistent with lifestyle A1c has been consistently over 8%, now 7.9  Alyssa Ross had been on Bydureon in 2016 with some improvement but had nausea  Current management, blood sugars and problems identified as follows  Her fasting blood sugars have been overall fair with some variability  Unable to download her meter to get an idea of her patterns  Recently all her readings after dinner are over 200 and Alyssa Ross has also a high reading in the afternoon over 200  Alyssa Ross thinks most for readings are related to poor diet  Alyssa Ross takes Humalog for a few days and then Alyssa Ross feels bloated and stopped taking it for a few days  Alyssa Ross is still very reluctant to consider any kind of insulin pump device including the V-go  Her weight is going up  Alyssa Ross irregular with her exercise program on the last week went 5  times to the gym  Oral hypoglycemic drugs: Amaryl '4mg'$  in a.m. and metformin 2250 mg       Side effects from medications: None  Monitors blood glucose: Once or twice a day   Glucometer: One Touch.          Blood Glucose readings from review of monitor today  Mean values apply above for all meters except median for One Touch  PRE-MEAL Fasting Lunch Dinner Bedtime Overall  Glucose range: 113-169   217-303   Mean/median:     178    Meals: 3 meals per day. Dinner 8-9 pm    DIET: As above, has been unable to consistently control higher carb foods and sweets; tries to reduce portions Physical activity: exercise:   2-4 days a week to the gym for 45 minutes, and irregular              Wt Readings from Last 3 Encounters:  08/07/15 250 lb 6.4 oz (113.581 kg)  06/06/15 241 lb 6.4 oz (109.498 kg)  04/05/15 240 lb 6.4 oz (109.045 kg)    LABS:  Lab Results  Component Value Date   HGBA1C 7.9* 08/02/2015   HGBA1C 8.0* 03/31/2015   HGBA1C 8.3* 12/20/2014   Lab Results  Component Value Date   MICROALBUR <0.7 12/20/2014   LDLCALC 77 12/20/2014   CREATININE 0.92 08/02/2015    Lab on 08/02/2015  Component Date Value Ref Range  Status  . Hgb A1c MFr Bld 08/02/2015 7.9* 4.6 - 6.5 % Final   Glycemic Control Guidelines for People with Diabetes:Non Diabetic:  <6%Goal of Therapy: <7%Additional Action Suggested:  >8%   . Sodium 08/02/2015 138  135 - 145 mEq/L Final  . Potassium 08/02/2015 3.8  3.5 - 5.1 mEq/L Final  . Chloride 08/02/2015 101  96 - 112 mEq/L Final  . CO2 08/02/2015 31  19 - 32 mEq/L Final  . Glucose, Bld 08/02/2015 134* 70 - 99 mg/dL Final  . BUN 08/02/2015 27* 6 - 23 mg/dL Final  . Creatinine, Ser 08/02/2015 0.92  0.40 - 1.20 mg/dL Final  . Total Bilirubin 08/02/2015 0.4  0.2 - 1.2 mg/dL Final  . Alkaline Phosphatase 08/02/2015 52  39 - 117 U/L Final  . AST 08/02/2015 13  0 - 37 U/L Final  . ALT 08/02/2015 14  0 - 35 U/L Final  . Total Protein 08/02/2015 7.5  6.0 - 8.3 g/dL  Final  . Albumin 08/02/2015 4.2  3.5 - 5.2 g/dL Final  . Calcium 08/02/2015 9.6  8.4 - 10.5 mg/dL Final  . GFR 08/02/2015 83.29  >60.00 mL/min Final      Medication List       This list is accurate as of: 08/07/15  9:28 AM.  Always use your most recent med list.               glucose blood test strip  Commonly known as:  ONE TOUCH ULTRA TEST  USE AS INSTRUCTED TO CHECK BLOOD SUGAR 2 TIMES PER DAY DX code E11.65     Insulin Degludec 100 UNIT/ML Sopn  Commonly known as:  TRESIBA FLEXTOUCH  Inject 40 Units into the skin daily.     insulin lispro 100 UNIT/ML KiwkPen  Commonly known as:  HUMALOG KWIKPEN  Inject 8 units three times a day with meals.     Insulin Pen Needle 31G X 8 MM Misc  Commonly known as:  B-D ULTRAFINE III SHORT PEN  USE 3 NEEDLES PER DAY     Insulin Pen Needle 32G X 5 MM Misc  Commonly known as:  NOVOTWIST  Use two per day to inject insulin     lisinopril-hydrochlorothiazide 10-12.5 MG tablet  Commonly known as:  PRINZIDE,ZESTORETIC  Take 1 tablet by mouth daily. Takes 1 tablet Monday, Wednesday and Friday     metFORMIN 750 MG 24 hr tablet  Commonly known as:  GLUCOPHAGE-XR  TAKE 3 TABLETS (2,250 MG TOTAL) DAILY     omeprazole 20 MG capsule  Commonly known as:  PRILOSEC  Take 1 capsule (20 mg total) by mouth daily.     ONE TOUCH DELICA LANCING DEV Misc  Use to check blood sugar 2 times per day     ONETOUCH DELICA LANCETS 16X Misc  USE TO CHECK BLOOD SUGARS 2 TIMES PER DAY DX CODE E11.65     potassium chloride SA 20 MEQ tablet  Commonly known as:  K-DUR,KLOR-CON  TAKE 1 TABLET DAILY     prochlorperazine 10 MG tablet  Commonly known as:  COMPAZINE  Take 1 tablet (10 mg total) by mouth every 6 (six) hours as needed for nausea or vomiting.     rosuvastatin 20 MG tablet  Commonly known as:  CRESTOR  TAKE ONE TABLET BY MOUTH DAILY     Vitamin D (Ergocalciferol) 50000 units Caps capsule  Commonly known as:  DRISDOL  Take 50,000 Units by mouth  2 (two) times a week.  Allergies: No Known Allergies  Past Medical History  Diagnosis Date  . 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 ASCUS (atypical squamous cells of undetermined significance) on Pap smear 08/06/1999  . Breast mass in female 2002    Left  . Yeast vaginitis 2006  . Fibroid uterus 2010  . Irregular bleeding 2011  . Diabetes mellitus without complication ([place])   . Hypertension     Past Surgical History  Procedure Laterality Date  . No past surgeries    . Dilatation & curettage/hysteroscopy with trueclear N/A 01/20/2014    Procedure: DILATATION & CURETTAGE/HYSTEROSCOPY WITH TRUCLEAR;  Surgeon: [name], MD;  Location: [facility];  Service: Gynecology;  Laterality: N/A;  . Dilatation & currettage/hysteroscopy with resectocope N/A 01/20/2014    Procedure: DILATATION & CURETTAGE/HYSTEROSCOPY WITH RESECTOCOPE;  Surgeon: [name], MD;  Location: [facility];  Service: Gynecology;  Laterality: N/A;    Family History  Problem Relation Age of Onset  . Diabetes Mother   . Diabetes Father   . Mental retardation Sister     Social History:  reports that Alyssa Ross has never smoked. Alyssa Ross has never used smokeless tobacco. Alyssa Ross reports that Alyssa Ross drinks alcohol. Alyssa Ross reports that Alyssa Ross does not use illicit drugs.  REVIEW of systems:  Alyssa Ross is being followed by her PCP for hypertension.   Has had hypercholesterolemia, taking Crestor with good control, has mild increase in triglycerides also  Lab Results  Component Value Date   CHOL 158 12/20/2014   HDL 49.40 12/20/2014   LDLCALC 77 12/20/2014   LDLDIRECT 97.7 02/11/2014   TRIG 158.0* 12/20/2014   CHOLHDL 3 12/20/2014      Examination:   BP 122/82 mmHg  Pulse 72  Temp(Src) 97.8 F (36.6 C)  Resp 16  Ht '5\' 9"'$  (1.753 m)  Wt 250 lb 6.4 oz (113.581 kg)  BMI 36.96 kg/m2  SpO2 96%  Body mass index is 36.96 kg/(m^2).    Assessment/Plan:   Diabetes type 2, uncontrolled  Alyssa Ross has had poor  control with A1c consistently around See history of present illness for detailed discussion of her current management, blood sugar patterns and problems identified Alyssa Ross has continued difficulty controlling the POSTPRANDIAL blood sugars Also cannot follow her diet and usually this is tending to cause higher readings up to about 300 after meals Alyssa Ross is also noncompliant with taking her HUMALOG because Alyssa Ross thinks it causes weight gain and bloating  Alyssa Ross again is reluctant to try various other drugs and methods of treating her diabetes including the V-go pump, weight loss drugs Alyssa Ross does not tolerate GLP-1 drugs and has not tried Tanzeum, is finally agreeing to do this Prior authorization will be done and Alyssa Ross will try 30 mg weekly, instructed today on the injection technique by nurse educator Alyssa Ross does need to exercise more regularly and Alyssa Ross wants to try using a personal trainer, needs a letter to help with this using her flexible spending account  Advised her to take HUMALOG with suppertime consistently, may reduce the dose for now Improve diet as discussed today including with the nurse educator  HYPERTENSION:  controlled and Alyssa Ross needs to follow-up with PCP regularly  Counseling time on subjects discussed above is over 50% of today's 25 minute visit   Patient Instructions  Check blood sugars on waking up  4 times a week Also check blood sugars about 2 hours after a meal and do this after different meals by rotation  Recommended blood sugar levels on  waking up is 90-130 and about 2 hours after meal is 130-160  Please bring your blood sugar monitor to each visit, thank you  Humalog with supper daily      Chaniqua Brisby 08/07/2015, 9:28 AM   Note: This office note was prepared with Estate agent. Any transcriptional errors that result from this process are unintentional.

## 2015-08-07 NOTE — Patient Instructions (Signed)
Check blood sugars on waking up  4 times a week Also check blood sugars about 2 hours after a meal and do this after different meals by rotation  Recommended blood sugar levels on waking up is 90-130 and about 2 hours after meal is 130-160  Please bring your blood sugar monitor to each visit, thank you  Humalog with supper daily

## 2015-08-09 NOTE — Patient Instructions (Signed)
Reduce fats at meals by limiting salad dressings, fried foods, added butter/oil, lower fat breads Increase exercise to 30 min. 5 days/wk. Call if questions about how to use/mix the Tanzium

## 2015-08-09 NOTE — Progress Notes (Signed)
We dicussed how this medication will work to lower her blood sugars.  She was shown how to mix the medication, wait 31mn., remix, remove the air, and inject the medication.  As well as how often to take this.  She reported good understanding of this and had no final questions.   We discussed general dietary guidelines and how/why she regained her weight.  Strongly encouraged lower fat meals and increased exercise to help with the weight loss.  She agreed to do this.

## 2015-08-23 ENCOUNTER — Other Ambulatory Visit: Payer: Self-pay | Admitting: Endocrinology

## 2015-09-07 ENCOUNTER — Telehealth: Payer: Self-pay | Admitting: Endocrinology

## 2015-09-07 NOTE — Telephone Encounter (Signed)
Pt said she missed your call and requests a call back

## 2015-09-07 NOTE — Telephone Encounter (Signed)
Left message for patient to return call.

## 2015-09-16 ENCOUNTER — Other Ambulatory Visit: Payer: Self-pay | Admitting: Endocrinology

## 2015-09-27 ENCOUNTER — Other Ambulatory Visit (INDEPENDENT_AMBULATORY_CARE_PROVIDER_SITE_OTHER): Payer: BLUE CROSS/BLUE SHIELD

## 2015-09-27 DIAGNOSIS — E1165 Type 2 diabetes mellitus with hyperglycemia: Secondary | ICD-10-CM

## 2015-09-27 DIAGNOSIS — Z794 Long term (current) use of insulin: Secondary | ICD-10-CM

## 2015-09-27 LAB — GLUCOSE, RANDOM: Glucose, Bld: 118 mg/dL — ABNORMAL HIGH (ref 70–99)

## 2015-09-29 LAB — FRUCTOSAMINE: FRUCTOSAMINE: 343 umol/L — AB (ref 0–285)

## 2015-10-02 ENCOUNTER — Ambulatory Visit (INDEPENDENT_AMBULATORY_CARE_PROVIDER_SITE_OTHER): Payer: BLUE CROSS/BLUE SHIELD | Admitting: Endocrinology

## 2015-10-02 ENCOUNTER — Telehealth: Payer: Self-pay | Admitting: Endocrinology

## 2015-10-02 VITALS — BP 126/84 | HR 80 | Temp 97.9°F | Resp 14 | Ht 69.0 in | Wt 245.8 lb

## 2015-10-02 DIAGNOSIS — Z794 Long term (current) use of insulin: Secondary | ICD-10-CM | POA: Diagnosis not present

## 2015-10-02 DIAGNOSIS — E1165 Type 2 diabetes mellitus with hyperglycemia: Secondary | ICD-10-CM

## 2015-10-02 NOTE — Patient Instructions (Signed)
Take humalog with all meals withcarbs  Check blood sugars on waking up 4-5  times a week Also check blood sugars about 2 hours after a meal and do this after different meals by rotation  Recommended blood sugar levels on waking up is 90-130 and about 2 hours after meal is 130-160  Please bring your blood sugar monitor to each visit, thank you

## 2015-10-02 NOTE — Telephone Encounter (Signed)
Patient no showed today's appt. Please advise on how to follow up. °A. No follow up necessary. °B. Follow up urgent. Contact patient immediately. °C. Follow up necessary. Contact patient and schedule visit in ___ days. °D. Follow up advised. Contact patient and schedule visit in ____weeks. ° °

## 2015-10-02 NOTE — Progress Notes (Signed)
Patient ID: Alyssa Ross, female   DOB: [birth year], 50 y.o.   MRN: [id number]   Reason for Appointment: Diabetes follow-up   History of Present Illness   Diagnosis: Type 2 DIABETES MELITUS, date of diagnosis 2005   She had previously been on metformin and Amaryl Because of inadequate control she was given Victoza in addition in 2011 With this her blood sugars have been significantly better and her A1c has been as low as 6.2 in 2013 Her blood sugars have been more difficult to control since 2014 an A1c consistently over 7% She  was started on insulin with Levemir in 06/2013 because of  increase in her A1c to 8.5% She had previously been taking Victoza along with Amaryl and metformin without consistent control Also had difficulty losing weight despite taking 1.8 mg Victoza, also had tried the 3 mg dose Because of episodic nausea and diarrhea in 1/16 she went off her Victoza  RECENT history:   INSULIN dose: TRESIBA 36 units pm.  Humalog recently none  She has difficulty getting her blood sugars under control but more recently has been trying to improve her diet and exercising regularly A1c has been consistently over 8%, last 7.9  Although she was explained the need to try a GLP-1 like Tanzeum which would cause less nausea she refuses to take this   Current management, blood sugars and problems identified as follows  Her fasting blood sugars have been overall better and fairly consistent; she says her readings were low normal and she has cut back in mid April  Her major change in her diet recently is still stop eating sweets, drinking drinks with sugar and also recently eliminating most foods like bread and other carbohydrates except  fruits and occasional potato  She does not take Humalog given when she is eating high carbohydrate meals and has a few readings over 200  Blood sugars are trending HIGHER in the afternoon and evening usually  POSTPRANDIAL readings after supper  are not consistently higher, still has a couple of high readings before supper  Her weight is going down recently  She regular with her exercise program on the last week went 5 times to the gym  Oral hypoglycemic drugs:  metformin 2250 mg       Side effects from medications: None  Monitors blood glucose: Once or twice a day   Glucometer: One Touch.          Blood Glucose readings from review of monitor today  Mean values apply above for all meters except median for One Touch  PRE-MEAL Fasting Lunch Dinner Bedtime Overall  Glucose range: 73-158    121-209    Mean/median:     124+/-43    POST-MEAL PC Breakfast PC Lunch PC Dinner  Glucose range:  146-209    Mean/median:       Meals: 3 meals per day. Dinner 8-9 pm    DIET: As above,tries to reduce sweets    Physical activity: exercise:  4-5 days a week to the gym for 45 minutes              Wt Readings from Last 3 Encounters:  10/02/15 245 lb 12.8 oz (111.494 kg)  08/07/15 250 lb 6.4 oz (113.581 kg)  06/06/15 241 lb 6.4 oz (109.498 kg)    LABS:  Lab Results  Component Value Date   HGBA1C 7.9* 08/02/2015   HGBA1C 8.0* 03/31/2015   HGBA1C 8.3* 12/20/2014   Lab Results  Component  Value Date   MICROALBUR <0.7 12/20/2014   LDLCALC 77 12/20/2014   CREATININE 0.92 08/02/2015    Lab on 09/27/2015  Component Date Value Ref Range Status  . Fructosamine 09/27/2015 343* 0 - 285 umol/L Final   Comment: Published reference interval for apparently healthy subjects between age 77 and 31 is 42 - 285 umol/L and in a poorly controlled diabetic population is 228 - 563 umol/L with a mean of 396 umol/L.   Marland Kitchen Glucose, Bld 09/27/2015 118* 70 - 99 mg/dL Final      Medication List       This list is accurate as of: 10/02/15  8:31 AM.  Always use your most recent med list.               glucose blood test strip  Commonly known as:  ONE TOUCH ULTRA TEST  USE AS INSTRUCTED TO CHECK BLOOD SUGAR 2 TIMES PER DAY DX code E11.65       Insulin Degludec 100 UNIT/ML Sopn  Commonly known as:  TRESIBA FLEXTOUCH  Inject 40 Units into the skin daily.     insulin lispro 100 UNIT/ML KiwkPen  Commonly known as:  HUMALOG KWIKPEN  Inject 8 units three times a day with meals.     Insulin Pen Needle 31G X 8 MM Misc  Commonly known as:  B-D ULTRAFINE III SHORT PEN  USE 3 NEEDLES PER DAY     Insulin Pen Needle 32G X 5 MM Misc  Commonly known as:  NOVOTWIST  Use two per day to inject insulin     lisinopril-hydrochlorothiazide 10-12.5 MG tablet  Commonly known as:  PRINZIDE,ZESTORETIC  Take 1 tablet by mouth daily. Takes 1 tablet Monday, Wednesday and Friday     metFORMIN 750 MG 24 hr tablet  Commonly known as:  GLUCOPHAGE-XR  TAKE 3 TABLETS (2,250 MG TOTAL) DAILY     omeprazole 20 MG capsule  Commonly known as:  PRILOSEC  Take 1 capsule (20 mg total) by mouth daily.     ONE TOUCH DELICA LANCING DEV Misc  Use to check blood sugar 2 times per day     ONETOUCH DELICA LANCETS 38B Misc  USE TO CHECK BLOOD SUGARS 2 TIMES PER DAY DX CODE E11.65     potassium chloride SA 20 MEQ tablet  Commonly known as:  K-DUR,KLOR-CON  TAKE 1 TABLET DAILY     prochlorperazine 10 MG tablet  Commonly known as:  COMPAZINE  Take 1 tablet (10 mg total) by mouth every 6 (six) hours as needed for nausea or vomiting.     rosuvastatin 20 MG tablet  Commonly known as:  CRESTOR  TAKE ONE TABLET BY MOUTH DAILY     Vitamin D (Ergocalciferol) 50000 units Caps capsule  Commonly known as:  DRISDOL  Take 50,000 Units by mouth 2 (two) times a week.        Allergies: No Known Allergies  Past Medical History  Diagnosis Date  . 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 ASCUS (atypical squamous cells of undetermined significance) on Pap smear 08/06/1999  . Breast mass in female 2002    Left  . Yeast vaginitis 2006  . Fibroid uterus 2010  . Irregular bleeding 2011  . Diabetes mellitus without complication ([place])   .  Hypertension     Past Surgical History  Procedure Laterality Date  . No past surgeries    . Dilatation & curettage/hysteroscopy with trueclear N/A 01/20/2014    Procedure: DILATATION &  CURETTAGE/HYSTEROSCOPY WITH TRUCLEAR;  Surgeon: [name], MD;  Location: [facility];  Service: Gynecology;  Laterality: N/A;  . Dilatation & currettage/hysteroscopy with resectocope N/A 01/20/2014    Procedure: DILATATION & CURETTAGE/HYSTEROSCOPY WITH RESECTOCOPE;  Surgeon: [name], MD;  Location: [facility];  Service: Gynecology;  Laterality: N/A;    Family History  Problem Relation Age of Onset  . Diabetes Mother   . Diabetes Father   . Mental retardation Sister     Social History:  reports that she has never smoked. She has never used smokeless tobacco. She reports that she drinks alcohol. She reports that she does not use illicit drugs.  REVIEW of systems:  She is being followed by her PCP for hypertension.   Has had hypercholesterolemia, taking Crestor with good control, has mild increase in triglycerides also  Lab Results  Component Value Date   CHOL 158 12/20/2014   HDL 49.40 12/20/2014   LDLCALC 77 12/20/2014   LDLDIRECT 97.7 02/11/2014   TRIG 158.0* 12/20/2014   CHOLHDL 3 12/20/2014      Examination:   BP 126/84 mmHg  Pulse 80  Temp(Src) 97.9 F (36.6 C)  Resp 14  Ht '5\' 9"'$  (1.753 m)  Wt 245 lb 12.8 oz (111.494 kg)  BMI 36.28 kg/m2  SpO2 96%  Body mass index is 36.28 kg/(m^2).    Assessment/Plan:   Diabetes type 2, uncontrolled  She has had poor control with A1c consistently around8% previously See history of present illness for detailed discussion of her current management, blood sugar patterns and problems identified  Overall her blood sugars are better with average significantly lower but fructosamine still indicates higher readings with the result of 343  She has continued difficulty controlling the POSTPRANDIAL blood sugars when she is getting more  carbohydrates but more recently with cutting out sweets and bread she is not always getting high readings Discussed that she may not stop the progression of her diabetes even with reducing carbohydrates and would still benefit from a drug like Tanzeum which would help stabilize her diabetes and help post prandial readings Currently she is not taking Humalog and refuses to take this as she thinks it causes weight gain  However, and that she takes some Humalog with meals with carbohydrates If blood sugars are getting consistently high she will try Tanzeum as discussed She will try to keep a food diary of what makes her blood sugar go up Continue exercise A1c in 2 months  HYPERTENSION:  Fairly well controlled  Follow-up with PCP  Counseling time on subjects discussed above is over 50% of today's 25 minute visit   Patient Instructions  Take humalog with all meals withcarbs  Check blood sugars on waking up 4-5  times a week Also check blood sugars about 2 hours after a meal and do this after different meals by rotation  Recommended blood sugar levels on waking up is 90-130 and about 2 hours after meal is 130-160  Please bring your blood sugar monitor to each visit, thank you      [facility] 10/02/2015, 8:31 AM   Note: This office note was prepared with Dragon voice recognition system technology. Any transcriptional errors that result from this process are unintentional.

## 2015-10-02 NOTE — Telephone Encounter (Signed)
She didn't no show

## 2015-10-26 ENCOUNTER — Other Ambulatory Visit: Payer: Self-pay | Admitting: Endocrinology

## 2015-11-03 ENCOUNTER — Other Ambulatory Visit: Payer: Self-pay | Admitting: *Deleted

## 2015-11-03 MED ORDER — INSULIN PEN NEEDLE 32G X 4 MM MISC: Status: DC

## 2015-12-01 ENCOUNTER — Other Ambulatory Visit: Payer: Self-pay | Admitting: Endocrinology

## 2015-12-04 ENCOUNTER — Other Ambulatory Visit (INDEPENDENT_AMBULATORY_CARE_PROVIDER_SITE_OTHER): Payer: BLUE CROSS/BLUE SHIELD

## 2015-12-04 DIAGNOSIS — E1165 Type 2 diabetes mellitus with hyperglycemia: Secondary | ICD-10-CM

## 2015-12-04 DIAGNOSIS — Z794 Long term (current) use of insulin: Secondary | ICD-10-CM | POA: Diagnosis not present

## 2015-12-04 LAB — HEMOGLOBIN A1C: HEMOGLOBIN A1C: 8.2 % — AB (ref 4.6–6.5)

## 2015-12-04 LAB — BASIC METABOLIC PANEL
BUN: 16 mg/dL (ref 6–23)
CHLORIDE: 102 meq/L (ref 96–112)
CO2: 32 mEq/L (ref 19–32)
Calcium: 9.8 mg/dL (ref 8.4–10.5)
Creatinine, Ser: 0.88 mg/dL (ref 0.40–1.20)
GFR: 87.55 mL/min (ref >60.00)
Glucose, Bld: 151 mg/dL — ABNORMAL HIGH (ref 70–99)
POTASSIUM: 3.7 meq/L (ref 3.5–5.1)
SODIUM: 138 meq/L (ref 135–145)

## 2015-12-07 ENCOUNTER — Encounter: Payer: Self-pay | Admitting: Endocrinology

## 2015-12-07 ENCOUNTER — Ambulatory Visit (INDEPENDENT_AMBULATORY_CARE_PROVIDER_SITE_OTHER): Payer: BLUE CROSS/BLUE SHIELD | Admitting: Endocrinology

## 2015-12-07 VITALS — BP 132/78 | HR 73 | Ht 69.0 in | Wt 247.0 lb

## 2015-12-07 DIAGNOSIS — E1165 Type 2 diabetes mellitus with hyperglycemia: Secondary | ICD-10-CM | POA: Diagnosis not present

## 2015-12-07 DIAGNOSIS — Z794 Long term (current) use of insulin: Secondary | ICD-10-CM

## 2015-12-07 MED ORDER — ALBIGLUTIDE 30 MG ~~LOC~~ PEN: PEN_INJECTOR | SUBCUTANEOUS | Status: DC

## 2015-12-07 NOTE — Progress Notes (Signed)
Patient ID: Alyssa Ross, female   DOB: November 20, 1965, 50 y.o.   MRN: [id number]   Reason for Appointment: Diabetes follow-up   History of Present Illness   Diagnosis: Type 2 DIABETES MELITUS, date of diagnosis 2005   She had previously been on metformin and Amaryl Because of inadequate control she was given Victoza in addition in 2011 With this her blood sugars have been significantly better and her A1c has been as low as 6.2 in 2013 Her blood sugars have been more difficult to control since 2014 an A1c consistently over 7% She  was started on insulin with Levemir in 06/2013 because of  increase in her A1c to 8.5% She had previously been taking Victoza along with Amaryl and metformin without consistent control Also had difficulty losing weight despite taking 1.8 mg Victoza, also had tried the 3 mg dose Because of episodic nausea and diarrhea in 1/16 she went off her Victoza  RECENT history:   INSULIN dose: TRESIBA 36 units pm.  Humalog recently none  She has difficulty getting her blood sugars under control  A1c has been consistently about 8% or more, now 8.2  Although she was explained the need to try a GLP-1 like Tanzeum which would cause less nausea she had refused to take this She also refuses to consider Invokana, V-go insulin pump, weight loss medications  Current management, blood sugars and problems identified as follows  Her fasting blood sugars have been variable with some good readings and no overnight hypoglycemia  She has only mild increase in blood sugar around lunchtime at times  She has not done many readings after lunch and evening meal but most of her readings in the evening are over 200  She cannot be consistent with her diet.  Last time she had cut back on carbohydrates and sweets but now is not watching what she is eating and does not appear motivated  On her last visit she was given the option of trying Tanzeum but she had refused  She does not want  to take Humalog because she thinks it makes her gain weight and feels bloated  She is regular with her exercise program except last week going to the gym  Oral hypoglycemic drugs:  metformin 2250 mg       Side effects from medications: None  Monitors blood glucose: Once or twice a day   Glucometer: One Touch.          Blood Glucose readings from review of monitor today  Mean values apply above for all meters except median for One Touch  PRE-MEAL Fasting Lunch Dinner PCS  Overall  Glucose range: 104-218  92-218   201-354    Mean/median: 140    280  174   Meals: 3 meals per day. Dinner 8-9 pm    DIET: As above,Not watching high-fat foods, eating some cookies  Physical activity: exercise:  4-5 days a week to the gym for 45 minutes              Wt Readings from Last 3 Encounters:  12/07/15 247 lb (112.038 kg)  10/02/15 245 lb 12.8 oz (111.494 kg)  08/07/15 250 lb 6.4 oz (113.581 kg)    LABS:  Lab Results  Component Value Date   HGBA1C 8.2* 12/04/2015   HGBA1C 7.9* 08/02/2015   HGBA1C 8.0* 03/31/2015   Lab Results  Component Value Date   MICROALBUR <0.7 12/20/2014   LDLCALC 77 12/20/2014   CREATININE 0.88 12/04/2015  Lab on 12/04/2015  Component Date Value Ref Range Status  . Hgb A1c MFr Bld 12/04/2015 8.2* 4.6 - 6.5 % Final   Glycemic Control Guidelines for People with Diabetes:Non Diabetic:  <6%Goal of Therapy: <7%Additional Action Suggested:  >8%   . Sodium 12/04/2015 138  135 - 145 mEq/L Final  . Potassium 12/04/2015 3.7  3.5 - 5.1 mEq/L Final  . Chloride 12/04/2015 102  96 - 112 mEq/L Final  . CO2 12/04/2015 32  19 - 32 mEq/L Final  . Glucose, Bld 12/04/2015 151* 70 - 99 mg/dL Final  . BUN 12/04/2015 16  6 - 23 mg/dL Final  . Creatinine, Ser 12/04/2015 0.88  0.40 - 1.20 mg/dL Final  . Calcium 12/04/2015 9.8  8.4 - 10.5 mg/dL Final  . GFR 12/04/2015 87.55  >60.00 mL/min Final      Medication List       This list is accurate as of: 12/07/15  9:20 AM.  Always  use your most recent med list.               Albiglutide 30 MG Pen  Commonly known as:  TANZEUM  Inject weekly     glucose blood test strip  Commonly known as:  ONE TOUCH ULTRA TEST  USE AS INSTRUCTED TO CHECK BLOOD SUGAR 2 TIMES PER DAY DX code E11.65     insulin lispro 100 UNIT/ML KiwkPen  Commonly known as:  HUMALOG KWIKPEN  Inject 8 units three times a day with meals.     Insulin Pen Needle 31G X 8 MM Misc  Commonly known as:  B-D ULTRAFINE III SHORT PEN  USE 3 NEEDLES PER DAY     Insulin Pen Needle 32G X 5 MM Misc  Commonly known as:  NOVOTWIST  Use two per day to inject insulin     Insulin Pen Needle 32G X 4 MM Misc  Commonly known as:  NOVOFINE PLUS  Use 3 per day to inject insulin     lisinopril-hydrochlorothiazide 10-12.5 MG tablet  Commonly known as:  PRINZIDE,ZESTORETIC  Take 1 tablet by mouth daily. Takes 1 tablet Monday, Wednesday and Friday     metFORMIN 750 MG 24 hr tablet  Commonly known as:  GLUCOPHAGE-XR  TAKE 3 TABLETS (2,250 MG TOTAL) DAILY     omeprazole 20 MG capsule  Commonly known as:  PRILOSEC  Take 1 capsule (20 mg total) by mouth daily.     ONE TOUCH DELICA LANCING DEV Misc  Use to check blood sugar 2 times per day     ONETOUCH DELICA LANCETS 54M Misc  USE TO CHECK BLOOD SUGARS 2 TIMES PER DAY DX CODE E11.65     potassium chloride SA 20 MEQ tablet  Commonly known as:  K-DUR,KLOR-CON  TAKE 1 TABLET DAILY     prochlorperazine 10 MG tablet  Commonly known as:  COMPAZINE  Take 1 tablet (10 mg total) by mouth every 6 (six) hours as needed for nausea or vomiting.     rosuvastatin 20 MG tablet  Commonly known as:  CRESTOR  TAKE ONE TABLET BY MOUTH DAILY     TRESIBA FLEXTOUCH 100 UNIT/ML Sopn  Generic drug:  Insulin Degludec  INJECT 40 UNITS INTO THE SKIN DAILY AS DIRECTED     Vitamin D (Ergocalciferol) 50000 units Caps capsule  Commonly known as:  DRISDOL  Take 50,000 Units by mouth 2 (two) times a week.        Allergies: No  Known Allergies  Past Medical  History  Diagnosis Date  . 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 ASCUS (atypical squamous cells of undetermined significance) on Pap smear 08/06/1999  . Breast mass in female 2002    Left  . Yeast vaginitis 2006  . Fibroid uterus 2010  . Irregular bleeding 2011  . Diabetes mellitus without complication ([place])   . Hypertension     Past Surgical History  Procedure Laterality Date  . No past surgeries    . Dilatation & curettage/hysteroscopy with trueclear N/A 01/20/2014    Procedure: DILATATION & CURETTAGE/HYSTEROSCOPY WITH TRUCLEAR;  Surgeon: [name], MD;  Location: [facility];  Service: Gynecology;  Laterality: N/A;  . Dilatation & currettage/hysteroscopy with resectocope N/A 01/20/2014    Procedure: DILATATION & CURETTAGE/HYSTEROSCOPY WITH RESECTOCOPE;  Surgeon: [name], MD;  Location: [facility];  Service: Gynecology;  Laterality: N/A;    Family History  Problem Relation Age of Onset  . Diabetes Mother   . Diabetes Father   . Mental retardation Sister     Social History:  reports that she has never smoked. She has never used smokeless tobacco. She reports that she drinks alcohol. She reports that she does not use illicit drugs.  REVIEW of systems:  She is being followed by her PCP for hypertension.   Has had hypercholesterolemia, taking Crestor with good control, has mild increase in triglycerides Previously LDL in 5/17 was 68 with triglycerides 66  Lab Results  Component Value Date   CHOL 158 12/20/2014   HDL 49.40 12/20/2014   LDLCALC 77 12/20/2014   LDLDIRECT 97.7 02/11/2014   TRIG 158.0* 12/20/2014   CHOLHDL 3 12/20/2014      Examination:   BP 132/78 mmHg  Pulse 73  Ht '5\' 9"'$  (1.753 m)  Wt 247 lb (112.038 kg)  BMI 36.46 kg/m2  SpO2 98%  Body mass index is 36.46 kg/(m^2).    Assessment/Plan:   Diabetes type 2, uncontrolled  She has had poor control with A1c consistently around 8%  See  history of present illness for detailed discussion of her current management, blood sugar patterns and problems identified  She is totally resistant to changing her management of her diabetes despite persistent  poor control Again discussed various options for improving uncontrolled including weight loss drugs, SGLT 2 drugs, use of a V-go insulin pump and GLP-1 drugs Also discussed that prescription weight loss medications have been approved for long-term use and are effective but she is still reluctant to consider these  Discussed pros and cons of various options and she is generally refusing to consider anything except possibly Tanzeum  She will try 30 mg weekly for a week, may need prior authorization Discussed taking more readings after meals and still needing to take mealtime insulin with higher carbohydrate meals If fasting blood sugars are improving with this may need to reduce basal insulin also Continue exercise regimen  HYPERTENSION:  Fairly well controlled  Follow-up with PCP  Counseling time on subjects discussed above is over 50% of today's 25 minute visit   There are no Patient Instructions on file for this visit.   Fayth Trefry 12/07/2015, 9:20 AM   Note: This office note was prepared with Dragon voice recognition system technology. Any transcriptional errors that result from this process are unintentional.

## 2015-12-18 ENCOUNTER — Telehealth: Payer: Self-pay | Admitting: Internal Medicine

## 2015-12-18 NOTE — Telephone Encounter (Signed)
The tanzeum is still pending PA please contact with the determination of the PA call to [facility]

## 2015-12-19 NOTE — Telephone Encounter (Signed)
PA has been submitted to the pt's insurance. Once we receive a response pt and pt's pharmacy will be notified.

## 2015-12-20 ENCOUNTER — Telehealth: Payer: Self-pay | Admitting: Endocrinology

## 2015-12-20 NOTE — Telephone Encounter (Signed)
I contacted the pt and advised we have submitted the initial PA but received a form requesting more information from the insurance company. Pt was advised as soon as this form is completed we will fax and let her know when we receive confirmation from the insurance.

## 2015-12-20 NOTE — Telephone Encounter (Signed)
Following up on tanzeum needs PA status

## 2015-12-21 ENCOUNTER — Other Ambulatory Visit: Payer: Self-pay | Admitting: Endocrinology

## 2015-12-22 ENCOUNTER — Telehealth: Payer: Self-pay | Admitting: Endocrinology

## 2015-12-22 NOTE — Telephone Encounter (Signed)
PT called again, she stated they have still not received PA form and needs this done ASAP

## 2015-12-25 NOTE — Telephone Encounter (Signed)
I contacted the pt and advised Pa has been submitted twice and we are waiting on feed back from the insurance company. Pt advised we would notify her once we receive feed back from the insurance. Pt voiced understanding.

## 2016-01-02 ENCOUNTER — Other Ambulatory Visit: Payer: Self-pay | Admitting: Endocrinology

## 2016-01-08 ENCOUNTER — Other Ambulatory Visit: Payer: Self-pay

## 2016-01-08 ENCOUNTER — Other Ambulatory Visit (INDEPENDENT_AMBULATORY_CARE_PROVIDER_SITE_OTHER): Payer: BLUE CROSS/BLUE SHIELD

## 2016-01-08 DIAGNOSIS — Z794 Long term (current) use of insulin: Secondary | ICD-10-CM

## 2016-01-08 DIAGNOSIS — E1165 Type 2 diabetes mellitus with hyperglycemia: Secondary | ICD-10-CM | POA: Diagnosis not present

## 2016-01-08 LAB — BASIC METABOLIC PANEL
BUN: 21 mg/dL (ref 6–23)
CALCIUM: 9.6 mg/dL (ref 8.4–10.5)
CO2: 29 meq/L (ref 19–32)
CREATININE: 1.02 mg/dL (ref 0.40–1.20)
Chloride: 102 mEq/L (ref 96–112)
GFR: 73.81 mL/min (ref >60.00)
Glucose, Bld: 103 mg/dL — ABNORMAL HIGH (ref 70–99)
Potassium: 3.8 mEq/L (ref 3.5–5.1)
SODIUM: 139 meq/L (ref 135–145)

## 2016-01-08 LAB — MICROALBUMIN / CREATININE URINE RATIO
CREATININE, U: 201.8 mg/dL
MICROALB UR: 1 mg/dL (ref 0.0–1.9)
MICROALB/CREAT RATIO: 0.5 mg/g (ref 0.0–30.0)

## 2016-01-08 MED ORDER — ROSUVASTATIN CALCIUM 20 MG PO TABS: 20.0000 mg | ORAL_TABLET | Freq: Every day | ORAL | 1 refills | Status: DC

## 2016-01-09 LAB — FRUCTOSAMINE: FRUCTOSAMINE: 282 umol/L (ref 0–285)

## 2016-01-11 ENCOUNTER — Ambulatory Visit (INDEPENDENT_AMBULATORY_CARE_PROVIDER_SITE_OTHER): Payer: BLUE CROSS/BLUE SHIELD | Admitting: Endocrinology

## 2016-01-11 ENCOUNTER — Encounter: Payer: Self-pay | Admitting: Endocrinology

## 2016-01-11 VITALS — BP 116/72 | HR 82 | Ht 69.0 in | Wt 245.0 lb

## 2016-01-11 DIAGNOSIS — Z794 Long term (current) use of insulin: Secondary | ICD-10-CM | POA: Diagnosis not present

## 2016-01-11 DIAGNOSIS — E1165 Type 2 diabetes mellitus with hyperglycemia: Secondary | ICD-10-CM | POA: Diagnosis not present

## 2016-01-11 NOTE — Patient Instructions (Signed)
Check blood sugars on waking up  Every 2 days  Try 36 Tresiba  Also check blood sugars about 2 hours after a meal and do this after different meals by rotation  Recommended blood sugar levels on waking up is 90-130 and about 2 hours after meal is 130-160  Please bring your blood sugar monitor to each visit, thank you  Watch diet

## 2016-01-11 NOTE — Progress Notes (Signed)
Patient ID: Alyssa Ross, female   DOB: 02/13/1966, 50 y.o.   MRN: [id number]   Reason for Appointment: Diabetes follow-up   History of Present Illness   Diagnosis: Type 2 DIABETES MELITUS, date of diagnosis 2005   She had previously been on metformin and Amaryl Because of inadequate control she was given Victoza in addition in 2011 With this her blood sugars have been significantly better and her A1c has been as low as 6.2 in 2013 Her blood sugars have been more difficult to control since 2014 an A1c consistently over 7% She  was started on insulin with Levemir in 06/2013 because of  increase in her A1c to 8.5% She had previously been taking Victoza along with Amaryl and metformin without consistent control Also had difficulty losing weight despite taking 1.8 mg Victoza, also had tried the 3 mg dose Because of episodic nausea and diarrhea in 1/16 she went off her Victoza  RECENT history:   INSULIN dose: TRESIBA 40 units pm.  Humalog  none Non-insulin hypoglycemic drugs:  metformin 2250 mg, Tanzeum 30 mg weekly      She has difficulty getting her blood sugars under control  A1c has been consistently about 8% or more  After much deliberation and getting prior authorization she was able to start Tanzeum 30 mg weekly in 6/17 She also refuses to consider Invokana, V-go insulin pump, weight loss medications  Current management, blood sugars and problems identified as follows  She has taken about 5 injections of the Tanzeum including samples from her PCP  She does have some nausea but this is tolerable  Her fasting blood sugars have been very consistent more recently and near normal without hypoglycemia, this is without changing her to [place]  dose which she is taking at suppertime  She was getting markedly increased blood sugars in the afternoons until about 2 weeks ago and these appear to be much better generally  She has done only 3 readings after supper with only one  significantly high reading  Her main difficulty had been inconsistent with diet with making wrong choices in her food selections  More recently she thinks she is better with types and portions of foods that she is eating  She however has done very few readings postprandial after evening meal when she was usually having high readings averaging 280  She does not want to take Humalog because she thinks it makes her gain weight and feels bloated  She is regular with her exercise program      Side effects from medications: None  Monitors blood glucose: Once or twice a day   Glucometer: One Touch.          Blood Glucose readings from review of monitor today  Mean values apply above for all meters except median for One Touch  PRE-MEAL Fasting PCPb F  PCL  Bedtime Overall  Glucose range: 82-134  80-186  134-322  133-235    Mean/median: 97   192   133    Meals: 3 meals per day. Dinner 8-9 pm    DIET: As above, Not consistently watching high-fat foods, eating some cookies  Physical activity: exercise:  4-5 days a week to the gym for 45 minutes              Wt Readings from Last 3 Encounters:  01/11/16 245 lb (111.1 kg)  12/07/15 247 lb (112 kg)  10/02/15 245 lb 12.8 oz (111.5 kg)    LABS:  Lab Results  Component Value Date   HGBA1C 8.2 (H) 12/04/2015   HGBA1C 7.9 (H) 08/02/2015   HGBA1C 8.0 (H) 03/31/2015   Lab Results  Component Value Date   MICROALBUR 1.0 01/08/2016   LDLCALC 77 12/20/2014   CREATININE 1.02 01/08/2016    Lab on 01/08/2016  Component Date Value Ref Range Status  . Microalb, Ur 01/08/2016 1.0  0.0 - 1.9 mg/dL Final  . Creatinine,U 01/08/2016 201.8  mg/dL Final  . Microalb Creat Ratio 01/08/2016 0.5  0.0 - 30.0 mg/g Final  . Sodium 01/08/2016 139  135 - 145 mEq/L Final  . Potassium 01/08/2016 3.8  3.5 - 5.1 mEq/L Final  . Chloride 01/08/2016 102  96 - 112 mEq/L Final  . CO2 01/08/2016 29  19 - 32 mEq/L Final  . Glucose, Bld 01/08/2016 103* 70 - 99 mg/dL  Final  . BUN 01/08/2016 21  6 - 23 mg/dL Final  . Creatinine, Ser 01/08/2016 1.02  0.40 - 1.20 mg/dL Final  . Calcium 01/08/2016 9.6  8.4 - 10.5 mg/dL Final  . GFR 01/08/2016 73.81  >60.00 mL/min Final  . Fructosamine 01/09/2016 282  0 - 285 umol/L Final   Comment: Published reference interval for apparently healthy subjects between age 43 and 68 is 55 - 285 umol/L and in a poorly controlled diabetic population is 228 - 563 umol/L with a mean of 396 umol/L.       Medication List       Accurate as of 01/11/16 11:59 PM. Always use your most recent med list.          Albiglutide 30 MG Pen Commonly known as:  TANZEUM Inject weekly   insulin lispro 100 UNIT/ML KiwkPen Commonly known as:  HUMALOG KWIKPEN Inject 8 units three times a day with meals.   Insulin Pen Needle 32G X 5 MM Misc Commonly known as:  NOVOTWIST Use two per day to inject insulin   Insulin Pen Needle 32G X 4 MM Misc Commonly known as:  NOVOFINE PLUS Use 3 per day to inject insulin   B-D ULTRAFINE III SHORT PEN 31G X 8 MM Misc Generic drug:  Insulin Pen Needle USE 3 NEEDLES PER DAY   lisinopril-hydrochlorothiazide 10-12.5 MG tablet Commonly known as:  PRINZIDE,ZESTORETIC Take 1 tablet by mouth daily. Takes 1 tablet Monday, Wednesday and Friday   metFORMIN 750 MG 24 hr tablet Commonly known as:  GLUCOPHAGE-XR TAKE 3 TABLETS (2,250 MG TOTAL) DAILY   omeprazole 20 MG capsule Commonly known as:  PRILOSEC Take 1 capsule (20 mg total) by mouth daily.   ONE TOUCH DELICA LANCING DEV Misc Use to check blood sugar 2 times per day   ONE TOUCH ULTRA TEST test strip Generic drug:  glucose blood USE AS INSTRUCTED TO CHECK BLOOD SUGAR TWICE A DAY   ONETOUCH DELICA LANCETS 19Q Misc USE TO CHECK BLOOD SUGARS TWICE A DAY   potassium chloride SA 20 MEQ tablet Commonly known as:  K-DUR,KLOR-CON TAKE 1 TABLET DAILY   prochlorperazine 10 MG tablet Commonly known as:  COMPAZINE Take 1 tablet (10 mg total) by  mouth every 6 (six) hours as needed for nausea or vomiting.   rosuvastatin 20 MG tablet Commonly known as:  CRESTOR Take 1 tablet (20 mg total) by mouth daily.   TRESIBA FLEXTOUCH 100 UNIT/ML Sopn Generic drug:  Insulin Degludec INJECT 40 UNITS INTO THE SKIN DAILY AS DIRECTED   Vitamin D (Ergocalciferol) 50000 units Caps capsule Commonly known as:  DRISDOL Take  50,000 Units by mouth 2 (two) times a week.   Vitamin D3 5000 units Caps Take by mouth.       Allergies: No Known Allergies  Past Medical History:  Diagnosis Date  . ASCUS (atypical squamous cells of undetermined significance) on Pap smear 08/06/1999  . Breast mass in female 2002   Left  . Diabetes mellitus without complication ([place])   . Fibroid uterus 2010  . Hypertension   . Irregular bleeding 2011  . 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 Yeast vaginitis 2006    Past Surgical History:  Procedure Laterality Date  . DILATATION & CURETTAGE/HYSTEROSCOPY WITH TRUECLEAR N/A 01/20/2014   Procedure: DILATATION & CURETTAGE/HYSTEROSCOPY WITH TRUCLEAR;  Surgeon: [name], MD;  Location: [facility];  Service: Gynecology;  Laterality: N/A;  . DILATATION & CURRETTAGE/HYSTEROSCOPY WITH RESECTOCOPE N/A 01/20/2014   Procedure: [place];  Surgeon: [name], MD;  Location: [facility];  Service: Gynecology;  Laterality: N/A;  . NO PAST SURGERIES      Family History  Problem Relation Age of Onset  . Diabetes Mother   . Diabetes Father   . Mental retardation Sister     Social History:  reports that she has never smoked. She has never used smokeless tobacco. She reports that she drinks alcohol. She reports that she does not use drugs.  REVIEW of systems:  She is being followed by her PCP for hypertension.   Has had hypercholesterolemia, taking Crestor with good control, has mild increase in triglycerides Previously LDL in 5/17 was 68 with  triglycerides 66  Lab Results  Component Value Date   CHOL 158 12/20/2014   HDL 49.40 12/20/2014   LDLCALC 77 12/20/2014   LDLDIRECT 97.7 02/11/2014   TRIG 158.0 (H) 12/20/2014   CHOLHDL 3 12/20/2014      Examination:   BP 116/72 (BP Location: Left Arm, Patient Position: Sitting, Cuff Size: Normal)   Pulse 82   Ht '5\' 9"'$  (1.753 m)   Wt 245 lb (111.1 kg)   SpO2 95%   BMI 36.18 kg/m   Body mass index is 36.18 kg/m.    Assessment/Plan:   Diabetes type 2, uncontrolled  She has had poor control with A1c consistently around 8%  See history of present illness for detailed discussion of her current management, blood sugar patterns and problems identified  With finally starting Tanzeum in addition to her basal insulin and metformin her blood sugars appear to be significantly better Fructosamine is upper normal indicating much improved control  She can do better with her diet and also start checking blood sugars after evening meal which she is not doing However despite her improved blood sugar her weight is still down 2 pounds and may be getting some benefit from taking Tanzeum She is pessimistic about continuing Tanzeum because of nausea but encouraged her to keep persistent since she has not had any luck controlling her diabetes any other way; also does not like to take other options of controlling her blood sugars  She is needing to check mornings after evening meal when she previously had a high readings Discussed possibly reducing Tresiba if morning sugars stay low-normal, for now will reduce it by 2 units Discussed blood sugar targets at various times Continue exercise regimen  HYPERTENSION:   well controlled  Follow-up with PCP  Counseling time on subjects discussed above is over 50% of today's 25 minute visit   Patient Instructions  Check blood sugars  on waking up  Every 2 days  Try 36 Tresiba  Also check blood sugars about 2 hours after a meal and do this after  different meals by rotation  Recommended blood sugar levels on waking up is 90-130 and about 2 hours after meal is 130-160  Please bring your blood sugar monitor to each visit, thank you  Watch diet     Neil Brickell 01/12/2016, 2:35 PM   Note: This office note was prepared with Dragon voice recognition system technology. Any transcriptional errors that result from this process are unintentional.

## 2016-01-30 ENCOUNTER — Other Ambulatory Visit: Payer: Self-pay

## 2016-01-30 MED ORDER — INSULIN DEGLUDEC 100 UNIT/ML ~~LOC~~ SOPN: 40.0000 [IU] | PEN_INJECTOR | Freq: Every day | SUBCUTANEOUS | 2 refills | Status: DC

## 2016-01-31 ENCOUNTER — Other Ambulatory Visit: Payer: Self-pay

## 2016-01-31 MED ORDER — INSULIN DEGLUDEC 100 UNIT/ML ~~LOC~~ SOPN: 40.0000 [IU] | PEN_INJECTOR | Freq: Every day | SUBCUTANEOUS | 2 refills | Status: DC

## 2016-02-05 ENCOUNTER — Telehealth: Payer: Self-pay | Admitting: Endocrinology

## 2016-02-05 NOTE — Telephone Encounter (Signed)
Patient need a refill of Albiglutide (TANZEUM) 30 MG PEN send to  [place], [place] [phone] (Phone) [phone] (Fax)

## 2016-02-14 ENCOUNTER — Other Ambulatory Visit: Payer: Self-pay | Admitting: *Deleted

## 2016-02-14 MED ORDER — ALBIGLUTIDE 30 MG ~~LOC~~ PEN: PEN_INJECTOR | SUBCUTANEOUS | 1 refills | Status: DC

## 2016-02-19 ENCOUNTER — Other Ambulatory Visit: Payer: Self-pay | Admitting: Endocrinology

## 2016-02-23 ENCOUNTER — Other Ambulatory Visit: Payer: Self-pay | Admitting: Endocrinology

## 2016-02-29 ENCOUNTER — Other Ambulatory Visit: Payer: Self-pay | Admitting: Endocrinology

## 2016-04-01 DIAGNOSIS — Z124 Encounter for screening for malignant neoplasm of cervix: Secondary | ICD-10-CM | POA: Diagnosis not present

## 2016-04-09 ENCOUNTER — Other Ambulatory Visit (INDEPENDENT_AMBULATORY_CARE_PROVIDER_SITE_OTHER): Payer: BLUE CROSS/BLUE SHIELD

## 2016-04-09 DIAGNOSIS — E1165 Type 2 diabetes mellitus with hyperglycemia: Secondary | ICD-10-CM

## 2016-04-09 DIAGNOSIS — Z794 Long term (current) use of insulin: Secondary | ICD-10-CM

## 2016-04-09 LAB — BASIC METABOLIC PANEL
BUN: 24 mg/dL — AB (ref 6–23)
CHLORIDE: 100 meq/L (ref 96–112)
CO2: 31 meq/L (ref 19–32)
Calcium: 9.9 mg/dL (ref 8.4–10.5)
Creatinine, Ser: 1 mg/dL (ref 0.40–1.20)
GFR: 75.44 mL/min (ref >60.00)
Glucose, Bld: 142 mg/dL — ABNORMAL HIGH (ref 70–99)
POTASSIUM: 4 meq/L (ref 3.5–5.1)
Sodium: 138 mEq/L (ref 135–145)

## 2016-04-09 LAB — HEMOGLOBIN A1C: Hgb A1c MFr Bld: 8.4 % — ABNORMAL HIGH (ref 4.6–6.5)

## 2016-04-12 ENCOUNTER — Ambulatory Visit (INDEPENDENT_AMBULATORY_CARE_PROVIDER_SITE_OTHER): Payer: BLUE CROSS/BLUE SHIELD | Admitting: Endocrinology

## 2016-04-12 ENCOUNTER — Encounter: Payer: Self-pay | Admitting: Endocrinology

## 2016-04-12 VITALS — BP 128/82 | HR 84 | Ht 69.0 in | Wt 244.0 lb

## 2016-04-12 DIAGNOSIS — Z794 Long term (current) use of insulin: Secondary | ICD-10-CM | POA: Diagnosis not present

## 2016-04-12 DIAGNOSIS — Z23 Encounter for immunization: Secondary | ICD-10-CM | POA: Diagnosis not present

## 2016-04-12 DIAGNOSIS — E1165 Type 2 diabetes mellitus with hyperglycemia: Secondary | ICD-10-CM

## 2016-04-12 NOTE — Patient Instructions (Signed)
Restart Exercise  Check into Qsymia and Belviq Contrave   Next Rx for 50 tanzeum

## 2016-04-12 NOTE — Progress Notes (Signed)
Patient ID: Alyssa Ross, female   DOB: 13-Jun-1965, 50 y.o.   MRN: [id number]   Reason for Appointment: Diabetes follow-up   History of Present Illness   Diagnosis: Type 2 DIABETES MELITUS, date of diagnosis 2005   She had previously been on metformin and Amaryl Because of inadequate control she was given Victoza in addition in 2011 With this her blood sugars have been significantly better and her A1c has been as low as 6.2 in 2013 Her blood sugars have been more difficult to control since 2014 an A1c consistently over 7% She  was started on insulin with Levemir in 06/2013 because of  increase in her A1c to 8.5% She had previously been taking Victoza along with Amaryl and metformin without consistent control Also had difficulty losing weight despite taking 1.8 mg Victoza, also had tried the 3 mg dose Because of episodic nausea and diarrhea in 1/16 she went off her Victoza  RECENT history:   INSULIN dose: TRESIBA 40 units pm.  Humalog  none Non-insulin hypoglycemic drugs:  metformin 2250 mg, Tanzeum 30 mg weekly      She has difficulty getting her blood sugars under control  A1c has been consistently about 8% or more and now 8.4 This is despite her continuing her Tanzeum since 11/2015  She refuses to consider Invokana, V-go insulin pump, weight loss medications  Current management, blood sugars and problems identified as follows  She had better blood sugars right after starting Tanzeum and on her last visit without any significant side effects  However her blood sugars are again poorly controlled  She again thinks that this is from her not being able to follow her diet and eating poorly  She has been also irregular with exercise  Her fasting blood sugars have been fluctuating more and only occasionally near normal compared to her last visit when they were more consistent.  She again refuses to try improving her diet and usually eating high amounts of carbohydrate or fat  and sweets  She however has done again only a few readings postprandial after evening meal and these are consistently high  Also blood sugars in the afternoons are high also   She does not want to take Humalog because she thinks it makes her gain weight and feels bloated     Side effects from medications: None  Monitors blood glucose: Once or twice a day   Glucometer: One Touch.          Blood Glucose readings from review of monitor today  Mean values apply above for all meters except median for One Touch  PRE-MEAL Mornings  Lunch Afternoon  Bedtime Overall  Glucose range: 125-193  119  217-319  202-263    Mean/median: 157     194    Meals: 3 meals per day. Dinner 8-9 pm    DIET: As above, Not consistently watching high-fat foods, eating some cookies  Physical activity: exercise:  0-5 days a week to the gym for 45 minutes              Wt Readings from Last 3 Encounters:  04/12/16 244 lb (110.7 kg)  01/11/16 245 lb (111.1 kg)  12/07/15 247 lb (112 kg)    LABS:  Lab Results  Component Value Date   HGBA1C 8.4 (H) 04/09/2016   HGBA1C 8.2 (H) 12/04/2015   HGBA1C 7.9 (H) 08/02/2015   Lab Results  Component Value Date   MICROALBUR 1.0 01/08/2016   [place] 77  12/20/2014   CREATININE 1.00 04/09/2016    Lab on 04/09/2016  Component Date Value Ref Range Status  . Hgb A1c MFr Bld 04/09/2016 8.4* 4.6 - 6.5 % Final  . Sodium 04/09/2016 138  135 - 145 mEq/L Final  . Potassium 04/09/2016 4.0  3.5 - 5.1 mEq/L Final  . Chloride 04/09/2016 100  96 - 112 mEq/L Final  . CO2 04/09/2016 31  19 - 32 mEq/L Final  . Glucose, Bld 04/09/2016 142* 70 - 99 mg/dL Final  . BUN 04/09/2016 24* 6 - 23 mg/dL Final  . Creatinine, Ser 04/09/2016 1.00  0.40 - 1.20 mg/dL Final  . Calcium 04/09/2016 9.9  8.4 - 10.5 mg/dL Final  . GFR 04/09/2016 75.44  >60.00 mL/min Final      Medication List       Accurate as of 04/12/16 12:58 PM. Always use your most recent med list.          Albiglutide  30 MG Pen Commonly known as:  TANZEUM Inject weekly   insulin lispro 100 UNIT/ML KiwkPen Commonly known as:  HUMALOG KWIKPEN Inject 8 units three times a day with meals.   Insulin Pen Needle 32G X 5 MM Misc Commonly known as:  NOVOTWIST Use two per day to inject insulin   Insulin Pen Needle 32G X 4 MM Misc Commonly known as:  NOVOFINE PLUS Use 3 per day to inject insulin   B-D ULTRAFINE III SHORT PEN 31G X 8 MM Misc Generic drug:  Insulin Pen Needle USE 3 NEEDLES PER DAY   lisinopril-hydrochlorothiazide 10-12.5 MG tablet Commonly known as:  PRINZIDE,ZESTORETIC Take 1 tablet by mouth daily. Takes 1 tablet Monday, Wednesday and Friday   metFORMIN 750 MG 24 hr tablet Commonly known as:  GLUCOPHAGE-XR TAKE 3 TABLETS (2,250 MG TOTAL) DAILY   omeprazole 20 MG capsule Commonly known as:  PRILOSEC Take 1 capsule (20 mg total) by mouth daily.   ONE TOUCH DELICA LANCING DEV Misc Use to check blood sugar 2 times per day   ONE TOUCH ULTRA TEST test strip Generic drug:  glucose blood USE AS INSTRUCTED TO CHECK BLOOD SUGAR TWICE A DAY   ONETOUCH DELICA LANCETS 43X Misc USE TO CHECK BLOOD SUGARS TWICE A DAY   potassium chloride SA 20 MEQ tablet Commonly known as:  K-DUR,KLOR-CON TAKE 1 TABLET DAILY   prochlorperazine 10 MG tablet Commonly known as:  COMPAZINE Take 1 tablet (10 mg total) by mouth every 6 (six) hours as needed for nausea or vomiting.   rosuvastatin 20 MG tablet Commonly known as:  CRESTOR Take 1 tablet (20 mg total) by mouth daily.   TRESIBA FLEXTOUCH 100 UNIT/ML Sopn FlexTouch Pen Generic drug:  insulin degludec INJECT 40 UNITS UNDER THE SKIN DAILY AS DIRECTED   Vitamin D (Ergocalciferol) 50000 units Caps capsule Commonly known as:  DRISDOL Take 50,000 Units by mouth 2 (two) times a week.   Vitamin D3 5000 units Caps Take by mouth.       Allergies: No Known Allergies  Past Medical History:  Diagnosis Date  . ASCUS (atypical squamous cells of  undetermined significance) on Pap smear 08/06/1999  . Breast mass in female 2002   Left  . Diabetes mellitus without complication ([place])   . Fibroid uterus 2010  . Hypertension   . Irregular bleeding 2011  . 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 Yeast vaginitis 2006    Past Surgical History:  Procedure Laterality Date  . DILATATION & CURETTAGE/HYSTEROSCOPY  WITH TRUECLEAR N/A 01/20/2014   Procedure: DILATATION & CURETTAGE/HYSTEROSCOPY WITH TRUCLEAR;  Surgeon: [name], MD;  Location: [facility];  Service: Gynecology;  Laterality: N/A;  . DILATATION & CURRETTAGE/HYSTEROSCOPY WITH RESECTOCOPE N/A 01/20/2014   Procedure: [place];  Surgeon: [name], MD;  Location: [facility];  Service: Gynecology;  Laterality: N/A;  . NO PAST SURGERIES      Family History  Problem Relation Age of Onset  . Diabetes Mother   . Diabetes Father   . Mental retardation Sister     Social History:  reports that she has never smoked. She has never used smokeless tobacco. She reports that she drinks alcohol. She reports that she does not use drugs.  REVIEW of systems:  She is being followed by her PCP for hypertension.   Has had hypercholesterolemia, taking Crestor with good control, has history of mild increase in triglycerides  LDL in 5/17 was 68 with triglycerides 66  Lab Results  Component Value Date   CHOL 158 12/20/2014   HDL 49.40 12/20/2014   LDLCALC 77 12/20/2014   LDLDIRECT 97.7 02/11/2014   TRIG 158.0 (H) 12/20/2014   CHOLHDL 3 12/20/2014      Examination:   BP 128/82   Pulse 84   Ht '5\' 9"'$  (1.753 m)   Wt 244 lb (110.7 kg)   SpO2 98%   BMI 36.03 kg/m   Body mass index is 36.03 kg/m.    Assessment/Plan:   Diabetes type 2, uncontrolled  She has had poor control with A1c consistently around 8% or more  See history of present illness for detailed discussion of her current management, blood sugar patterns  and problems identified  Although initially with starting Tanzeum her blood sugars were significantly better and she was trying to watch her diet she is back to her usual level of 4 postprandial control She does not have the willpower to improve her diet Despite this she does not want to try any obesity drugs and though it was explained that these have been started long-term and have proven benefits in patients for diabetes  She will increase her Tanzeum 50 mg when she finishes her current regimen She was given the choice of taking Qsymia or Belviq and she will look into these Encouraged her to start these medications as she does not have any other option to improve her diabetes and prevent long-term complications She will call back with her decision  Discussed blood sugar targets at various times Regular exercise regimen  HYPERTENSION:   well controlled  Follow-up with PCP  Influenza vaccine given  Counseling time on subjects discussed above is over 50% of today's 25 minute visit   Patient Instructions  Restart Exercise  Check into Qsymia and Belviq Contrave   Next Rx for 50 tanzeum    Aiesha Leland 04/12/2016, 12:58 PM   Note: This office note was prepared with Estate agent. Any transcriptional errors that result from this process are unintentional.

## 2016-05-11 DIAGNOSIS — Z0000 Encounter for general adult medical examination without abnormal findings: Secondary | ICD-10-CM | POA: Diagnosis not present

## 2016-05-11 DIAGNOSIS — I10 Essential (primary) hypertension: Secondary | ICD-10-CM | POA: Diagnosis not present

## 2016-05-11 DIAGNOSIS — E1165 Type 2 diabetes mellitus with hyperglycemia: Secondary | ICD-10-CM | POA: Diagnosis not present

## 2016-05-11 DIAGNOSIS — Z794 Long term (current) use of insulin: Secondary | ICD-10-CM | POA: Diagnosis not present

## 2016-05-29 ENCOUNTER — Other Ambulatory Visit: Payer: Self-pay | Admitting: Endocrinology

## 2016-05-30 DIAGNOSIS — Z1231 Encounter for screening mammogram for malignant neoplasm of breast: Secondary | ICD-10-CM | POA: Diagnosis not present

## 2016-06-07 DIAGNOSIS — H524 Presbyopia: Secondary | ICD-10-CM | POA: Diagnosis not present

## 2016-06-07 DIAGNOSIS — E119 Type 2 diabetes mellitus without complications: Secondary | ICD-10-CM | POA: Diagnosis not present

## 2016-06-18 ENCOUNTER — Other Ambulatory Visit: Payer: Self-pay | Admitting: Endocrinology

## 2016-07-09 ENCOUNTER — Other Ambulatory Visit (INDEPENDENT_AMBULATORY_CARE_PROVIDER_SITE_OTHER): Payer: BLUE CROSS/BLUE SHIELD

## 2016-07-09 DIAGNOSIS — Z794 Long term (current) use of insulin: Secondary | ICD-10-CM

## 2016-07-09 DIAGNOSIS — E1165 Type 2 diabetes mellitus with hyperglycemia: Secondary | ICD-10-CM

## 2016-07-09 LAB — GLUCOSE, RANDOM: Glucose, Bld: 185 mg/dL — ABNORMAL HIGH (ref 70–99)

## 2016-07-09 LAB — HEMOGLOBIN A1C: HEMOGLOBIN A1C: 9.3 % — AB (ref 4.6–6.5)

## 2016-07-12 ENCOUNTER — Ambulatory Visit (INDEPENDENT_AMBULATORY_CARE_PROVIDER_SITE_OTHER): Payer: BLUE CROSS/BLUE SHIELD | Admitting: Endocrinology

## 2016-07-12 ENCOUNTER — Encounter: Payer: Self-pay | Admitting: Endocrinology

## 2016-07-12 VITALS — BP 122/80 | HR 80 | Ht 68.0 in | Wt 245.0 lb

## 2016-07-12 DIAGNOSIS — E1165 Type 2 diabetes mellitus with hyperglycemia: Secondary | ICD-10-CM | POA: Diagnosis not present

## 2016-07-12 DIAGNOSIS — Z794 Long term (current) use of insulin: Secondary | ICD-10-CM | POA: Diagnosis not present

## 2016-07-12 MED ORDER — DULAGLUTIDE 0.75 MG/0.5ML ~~LOC~~ SOAJ: SUBCUTANEOUS | 0 refills | Status: DC

## 2016-07-12 NOTE — Progress Notes (Signed)
Patient ID: Alyssa Ross, female   DOB: [birth year], 51 y.o.   MRN: [id number]   Reason for Appointment: Diabetes follow-up   History of Present Illness   Diagnosis: Type 2 DIABETES MELITUS, date of diagnosis 2005   She had previously been on metformin and Amaryl Because of inadequate control she was given Victoza in addition in 2011 With this her blood sugars have been significantly better and her A1c has been as low as 6.2 in 2013 Her blood sugars have been more difficult to control since 2014 an A1c consistently over 7% She  was started on insulin with Levemir in 06/2013 because of  increase in her A1c to 8.5% She had previously been taking Victoza along with Amaryl and metformin without consistent control Also had difficulty losing weight despite taking 1.8 mg Victoza, also had tried the 3 mg dose Because of episodic nausea and diarrhea in 1/16 she went off her Victoza  RECENT history:   INSULIN dose: TRESIBA 40 units pm.  Humalog  none Non-insulin hypoglycemic drugs:  metformin 2250 mg, Tanzeum 30 mg weekly      She has difficulty getting her blood sugars under control  A1c has been consistently about 8% or more and now 9.3  She refuses to consider Invokana, V-go insulin pump, weight loss medications  Current management, blood sugars and problems identified as follows  She had better blood sugars right after starting Tanzeum but now they're getting progressively higher  She again has marked difficulty in controlling her diet and making poor choices and not controlling portions also  She was asked to consider weight loss medications on her last visit and even though she even got a prescription for increasing mania from her PCP she did not start these and is still worried about side effects  Blood sugars are again mostly higher postprandially with her not taking any Humalog  because she thinks it makes her gain weight and feels bloated  Her exercise regimen is  inconsistent  Weight is about the same     Side effects from medications: None  Monitors blood glucose: Once or twice a day   Glucometer: One Touch.          Blood Glucose readings from review of monitor download today  Mean values apply above for all meters except median for One Touch  PRE-MEAL Fasting Lunch Dinner Bedtime Overall  Glucose range: 97-217  138-319  191-392  144-320    Mean/median: 150   253  260  203    Meals: 3 meals per day. Dinner 8-9 pm    DIET: As above, Not consistently watching high-fat foods, eating some cookies  Physical activity: exercise:  0-5 days a week to the gym for 45 minutes              Wt Readings from Last 3 Encounters:  07/12/16 245 lb (111.1 kg)  04/12/16 244 lb (110.7 kg)  01/11/16 245 lb (111.1 kg)    LABS:  Lab Results  Component Value Date   HGBA1C 9.3 (H) 07/09/2016   HGBA1C 8.4 (H) 04/09/2016   HGBA1C 8.2 (H) 12/04/2015   Lab Results  Component Value Date   MICROALBUR 1.0 01/08/2016   LDLCALC 77 12/20/2014   CREATININE 1.00 04/09/2016    Lab on 07/09/2016  Component Date Value Ref Range Status  . Hgb A1c MFr Bld 07/09/2016 9.3* 4.6 - 6.5 % Final  . Glucose, Bld 07/09/2016 185* 70 - 99 mg/dL Final  Allergies as of 07/12/2016   No Known Allergies     Medication List       Accurate as of 07/12/16  8:36 AM. Always use your most recent med list.          Albiglutide 30 MG Pen Commonly known as:  TANZEUM Inject weekly   insulin lispro 100 UNIT/ML KiwkPen Commonly known as:  HUMALOG KWIKPEN Inject 8 units three times a day with meals.   Insulin Pen Needle 32G X 5 MM Misc Commonly known as:  NOVOTWIST Use two per day to inject insulin   Insulin Pen Needle 32G X 4 MM Misc Commonly known as:  NOVOFINE PLUS Use 3 per day to inject insulin   B-D ULTRAFINE III SHORT PEN 31G X 8 MM Misc Generic drug:  Insulin Pen Needle USE 3 NEEDLES PER DAY   lisinopril-hydrochlorothiazide 10-12.5 MG tablet Commonly known  as:  PRINZIDE,ZESTORETIC Take 1 tablet by mouth daily. Takes 1 tablet Monday, Wednesday and Friday   metFORMIN 750 MG 24 hr tablet Commonly known as:  GLUCOPHAGE-XR TAKE 3 TABLETS (2,250 MG TOTAL) DAILY   omeprazole 20 MG capsule Commonly known as:  PRILOSEC Take 1 capsule (20 mg total) by mouth daily.   ONE TOUCH DELICA LANCING DEV Misc Use to check blood sugar 2 times per day   ONE TOUCH ULTRA TEST test strip Generic drug:  glucose blood USE AS INSTRUCTED TO CHECK BLOOD SUGAR TWICE A DAY   ONETOUCH DELICA LANCETS 61W Misc USE TO CHECK BLOOD SUGARS TWICE A DAY   potassium chloride SA 20 MEQ tablet Commonly known as:  K-DUR,KLOR-CON TAKE 1 TABLET DAILY   prochlorperazine 10 MG tablet Commonly known as:  COMPAZINE Take 1 tablet (10 mg total) by mouth every 6 (six) hours as needed for nausea or vomiting.   rosuvastatin 20 MG tablet Commonly known as:  CRESTOR TAKE 1 TABLET DAILY   TRESIBA FLEXTOUCH 100 UNIT/ML Sopn FlexTouch Pen Generic drug:  insulin degludec INJECT 40 UNITS UNDER THE SKIN DAILY AS DIRECTED   Vitamin D (Ergocalciferol) 50000 units Caps capsule Commonly known as:  DRISDOL Take 50,000 Units by mouth 2 (two) times a week.   Vitamin D3 5000 units Caps Take by mouth.       Allergies: No Known Allergies  Past Medical History:  Diagnosis Date  . ASCUS (atypical squamous cells of undetermined significance) on Pap smear 08/06/1999  . Breast mass in female 2002   Left  . Diabetes mellitus without complication ([place])   . Fibroid uterus 2010  . Hypertension   . Irregular bleeding 2011  . 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 Yeast vaginitis 2006    Past Surgical History:  Procedure Laterality Date  . DILATATION & CURETTAGE/HYSTEROSCOPY WITH TRUECLEAR N/A 01/20/2014   Procedure: DILATATION & CURETTAGE/HYSTEROSCOPY WITH TRUCLEAR;  Surgeon: [name], MD;  Location: [facility];  Service: Gynecology;  Laterality: N/A;  .  DILATATION & CURRETTAGE/HYSTEROSCOPY WITH RESECTOCOPE N/A 01/20/2014   Procedure: [place];  Surgeon: [name], MD;  Location: [facility];  Service: Gynecology;  Laterality: N/A;  . NO PAST SURGERIES      Family History  Problem Relation Age of Onset  . Diabetes Mother   . Diabetes Father   . Mental retardation Sister     Social History:  reports that she has never smoked. She has never used smokeless tobacco. She reports that she drinks alcohol. She reports that she does not  use drugs.  REVIEW of systems:  She is being followed by her PCP for hypertension.  This is consistently controlled  Has had hypercholesterolemia, taking Crestor with good control, has history of mild increase in triglycerides  LDL in 5/17 was 68 with triglycerides 66, checked by PCP  Lab Results  Component Value Date   CHOL 158 12/20/2014   HDL 49.40 12/20/2014   LDLCALC 77 12/20/2014   LDLDIRECT 97.7 02/11/2014   TRIG 158.0 (H) 12/20/2014   CHOLHDL 3 12/20/2014      Examination:   BP 122/80   Pulse 80   Ht '5\' 8"'$  (1.727 m)   Wt 245 lb (111.1 kg)   SpO2 98%   BMI 37.25 kg/m   Body mass index is 37.25 kg/m.    Assessment/Plan:   Diabetes type 2, uncontrolled  She has had poor control with A1c consistently around 8% or more  See history of present illness for detailed discussion of her current management, blood sugar patterns and problems identified  Her A1c is over 9% now Has postprandial hyperglycemia as above related to poor diet and possibly some progression of her diabetes as she is not benefiting from Tanzeum currently  Again had a detailed discussion with the patient about various treatment options including weight loss medications and various GLP-1 drugs including Ozempic Patient is not wanting to take proven medications because of fear of side effects despite explaining that the incidences very small and that she needs to have more  concerned about her poorly controlled diabetes and the long-term effects of this  The patient finally agrees to give Trulicity a try with the 0.75 mg weekly Discussed how the pen is used She will call if she has any difficulties Also consider adding Qsymia or other weight loss drugs Follow-up in 1 month  Regular exercise regimen  HYPERTENSION:   well controlled and she follows-up with Dr. Baird Cancer periodically    Counseling time on subjects discussed above is over 50% of today's 25 minute visit   There are no Patient Instructions on file for this visit.   Jaque Dacy 07/12/2016, 8:36 AM   Note: This office note was prepared with Dragon voice recognition system technology. Any transcriptional errors that result from this process are unintentional.

## 2016-07-24 DIAGNOSIS — R300 Dysuria: Secondary | ICD-10-CM | POA: Diagnosis not present

## 2016-07-24 DIAGNOSIS — B9689 Other specified bacterial agents as the cause of diseases classified elsewhere: Secondary | ICD-10-CM | POA: Diagnosis not present

## 2016-07-24 DIAGNOSIS — N760 Acute vaginitis: Secondary | ICD-10-CM | POA: Diagnosis not present

## 2016-07-24 DIAGNOSIS — L298 Other pruritus: Secondary | ICD-10-CM | POA: Diagnosis not present

## 2016-07-30 DIAGNOSIS — M25572 Pain in left ankle and joints of left foot: Secondary | ICD-10-CM | POA: Diagnosis not present

## 2016-07-30 DIAGNOSIS — M76822 Posterior tibial tendinitis, left leg: Secondary | ICD-10-CM | POA: Diagnosis not present

## 2016-07-30 DIAGNOSIS — E1351 Other specified diabetes mellitus with diabetic peripheral angiopathy without gangrene: Secondary | ICD-10-CM | POA: Diagnosis not present

## 2016-08-07 DIAGNOSIS — L298 Other pruritus: Secondary | ICD-10-CM | POA: Diagnosis not present

## 2016-08-07 DIAGNOSIS — N390 Urinary tract infection, site not specified: Secondary | ICD-10-CM | POA: Diagnosis not present

## 2016-08-09 ENCOUNTER — Ambulatory Visit (INDEPENDENT_AMBULATORY_CARE_PROVIDER_SITE_OTHER): Payer: BLUE CROSS/BLUE SHIELD | Admitting: Endocrinology

## 2016-08-09 ENCOUNTER — Encounter: Payer: Self-pay | Admitting: Endocrinology

## 2016-08-09 VITALS — BP 114/80 | HR 84 | Ht 68.0 in | Wt 242.0 lb

## 2016-08-09 DIAGNOSIS — Z794 Long term (current) use of insulin: Secondary | ICD-10-CM

## 2016-08-09 DIAGNOSIS — E1165 Type 2 diabetes mellitus with hyperglycemia: Secondary | ICD-10-CM

## 2016-08-09 MED ORDER — DULAGLUTIDE 1.5 MG/0.5ML ~~LOC~~ SOAJ: SUBCUTANEOUS | 1 refills | Status: DC

## 2016-08-09 MED ORDER — FREESTYLE LIBRE SENSOR SYSTEM MISC: 1.0000 | 3 refills | Status: DC

## 2016-08-09 MED ORDER — FREESTYLE LIBRE READER DEVI: 1.0000 | 0 refills | Status: DC

## 2016-08-09 NOTE — Progress Notes (Signed)
Patient ID: Alyssa Ross, female   DOB: 23-May-1966, 51 y.o.   MRN: [id number]   Reason for Appointment: Diabetes follow-up   History of Present Illness   Diagnosis: Type 2 DIABETES MELITUS, date of diagnosis 2005   She had previously been on metformin and Amaryl Because of inadequate control she was given Victoza in addition in 2011 With this her blood sugars have been significantly better and her A1c has been as low as 6.2 in 2013 Her blood sugars have been more difficult to control since 2014 an A1c consistently over 7% She  was started on insulin with Levemir in 06/2013 because of  increase in her A1c to 8.5% She had previously been taking Victoza along with Amaryl and metformin without consistent control Also had difficulty losing weight despite taking 1.8 mg Victoza, also had tried the 3 mg dose Because of episodic nausea and diarrhea in 1/16 she went off her Victoza  RECENT history:   INSULIN dose: TRESIBA 40 units pm.  Humalog  none Non-insulin hypoglycemic drugs:  metformin 4332 mg, Trulicity 9.51 mg weekly      She has persistent difficulty getting her blood sugars under control  A1c has been consistently about 8% or more and last 9.3  She refuses to consider Invokana, V-go insulin pump, weight loss medications  Current management, blood sugars and problems identified as follows  She has had better blood sugars right with starting Trulicity about a month ago and taking 0.75 mg weekly  FASTING blood sugars are more consistent although still somewhat high  She is again not checking postprandial readings as discussed and very few recently  Overall however postprandial readings are not is extremely high as before when there were up to 392  She does not think she has any significant change in her satiety levels with Trulicity although he is trying to do better on watching her diet and has lost 3 pounds  She thinks she may have had mild nausea in the first few days  of Trulicity but not recently  She thinks that she did not feel the needle going in with the third Trulicity pen which she tried in her abdomen and she is otherwise injecting it in her legs  after starting Tanzeum but now they're getting progressively higher  She again has some difficulty in making better choices for her meals and snacks  Blood sugars are again mostly higher postprandially with some readings over 200 the afternoons and evenings but only has checked a couple of times late at night  Her exercise regimen is inconsistent     Side effects from medications: None  Monitors blood glucose: Once or twice a day   Glucometer: One Touch.          Blood Glucose readings from review of monitor download today  Mean values apply above for all meters except median for One Touch  PRE-MEAL Fasting Lunch Afternoons  Bedtime Overall  Glucose range: 117-178   175-328  151-258    Mean/median: 138    178  154     Meals: 3 meals per day. Dinner 8-9 pm    DIET: As above, Not consistently watching high-fat foods, eating some Sweets  Physical activity: exercise:  1-5 days a week to the gym for 45 minutes              Wt Readings from Last 3 Encounters:  08/09/16 242 lb (109.8 kg)  07/12/16 245 lb (111.1 kg)  04/12/16 244  lb (110.7 kg)    LABS:  Lab Results  Component Value Date   HGBA1C 9.3 (H) 07/09/2016   HGBA1C 8.4 (H) 04/09/2016   HGBA1C 8.2 (H) 12/04/2015   Lab Results  Component Value Date   MICROALBUR 1.0 01/08/2016   LDLCALC 77 12/20/2014   CREATININE 1.00 04/09/2016    No visits with results within 1 Week(s) from this visit.  Latest known visit with results is:  Lab on 07/09/2016  Component Date Value Ref Range Status  . Hgb A1c MFr Bld 07/09/2016 9.3* 4.6 - 6.5 % Final  . Glucose, Bld 07/09/2016 185* 70 - 99 mg/dL Final    Allergies as of 08/09/2016   No Known Allergies     Medication List       Accurate as of 08/09/16  1:07 PM. Always use your most recent  med list.          Albiglutide 30 MG Pen Commonly known as:  TANZEUM Inject weekly   Dulaglutide 1.5 MG/0.5ML Sopn Commonly known as:  TRULICITY Inject weekly   FREESTYLE LIBRE READER Devi 1 Device by Does not apply route as directed.   FREESTYLE LIBRE SENSOR SYSTEM Misc 1 strip by Does not apply route once a week. Apply to upper arm and change sensor every 10 days   insulin lispro 100 UNIT/ML KiwkPen Commonly known as:  HUMALOG KWIKPEN Inject 8 units three times a day with meals.   Insulin Pen Needle 32G X 5 MM Misc Commonly known as:  NOVOTWIST Use two per day to inject insulin   Insulin Pen Needle 32G X 4 MM Misc Commonly known as:  NOVOFINE PLUS Use 3 per day to inject insulin   B-D ULTRAFINE III SHORT PEN 31G X 8 MM Misc Generic drug:  Insulin Pen Needle USE 3 NEEDLES PER DAY   lisinopril-hydrochlorothiazide 10-12.5 MG tablet Commonly known as:  PRINZIDE,ZESTORETIC Take 1 tablet by mouth daily. Takes 1 tablet Monday, Wednesday and Friday   metFORMIN 750 MG 24 hr tablet Commonly known as:  GLUCOPHAGE-XR TAKE 3 TABLETS (2,250 MG TOTAL) DAILY   omeprazole 20 MG capsule Commonly known as:  PRILOSEC Take 1 capsule (20 mg total) by mouth daily.   ONE TOUCH DELICA LANCING DEV Misc Use to check blood sugar 2 times per day   ONE TOUCH ULTRA TEST test strip Generic drug:  glucose blood USE AS INSTRUCTED TO CHECK BLOOD SUGAR TWICE A DAY   ONETOUCH DELICA LANCETS 41Y Misc USE TO CHECK BLOOD SUGARS TWICE A DAY   potassium chloride SA 20 MEQ tablet Commonly known as:  K-DUR,KLOR-CON TAKE 1 TABLET DAILY   prochlorperazine 10 MG tablet Commonly known as:  COMPAZINE Take 1 tablet (10 mg total) by mouth every 6 (six) hours as needed for nausea or vomiting.   rosuvastatin 20 MG tablet Commonly known as:  CRESTOR TAKE 1 TABLET DAILY   TRESIBA FLEXTOUCH 100 UNIT/ML Sopn FlexTouch Pen Generic drug:  insulin degludec INJECT 40 UNITS UNDER THE SKIN DAILY AS  DIRECTED   Vitamin D (Ergocalciferol) 50000 units Caps capsule Commonly known as:  DRISDOL Take 50,000 Units by mouth 2 (two) times a week.   Vitamin D3 5000 units Caps Take by mouth.       Allergies: No Known Allergies  Past Medical History:  Diagnosis Date  . ASCUS (atypical squamous cells of undetermined significance) on Pap smear 08/06/1999  . Breast mass in female 2002   Left  . Diabetes mellitus without complication ([place])   .  Fibroid uterus 2010  . Hypertension   . Irregular bleeding 2011  . 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 Yeast vaginitis 2006    Past Surgical History:  Procedure Laterality Date  . DILATATION & CURETTAGE/HYSTEROSCOPY WITH TRUECLEAR N/A 01/20/2014   Procedure: DILATATION & CURETTAGE/HYSTEROSCOPY WITH TRUCLEAR;  Surgeon: [name], MD;  Location: [facility];  Service: Gynecology;  Laterality: N/A;  . DILATATION & CURRETTAGE/HYSTEROSCOPY WITH RESECTOCOPE N/A 01/20/2014   Procedure: [place];  Surgeon: [name], MD;  Location: [facility];  Service: Gynecology;  Laterality: N/A;  . NO PAST SURGERIES      Family History  Problem Relation Age of Onset  . Diabetes Mother   . Diabetes Father   . Mental retardation Sister     Social History:  reports that she has never smoked. She has never used smokeless tobacco. She reports that she drinks alcohol. She reports that she does not use drugs.  REVIEW of systems:  She is being followed by her PCP for hypertension with Zestoretic 20.   Has had hypercholesterolemia, taking Crestor20 mg with good control, has history of mild increase in triglycerides  LDL in 5/17 was 68 with triglycerides 66, checked by PCP  Lab Results  Component Value Date   CHOL 158 12/20/2014   HDL 49.40 12/20/2014   LDLCALC 77 12/20/2014   LDLDIRECT 97.7 02/11/2014   TRIG 158.0 (H) 12/20/2014   CHOLHDL 3 12/20/2014      Examination:   BP 114/80    Pulse 84   Ht '5\' 8"'$  (1.727 m)   Wt 242 lb (109.8 kg)   SpO2 97%   BMI 36.80 kg/m   Body mass index is 36.8 kg/m.    Assessment/Plan:   Diabetes type 2, uncontrolled  See history of present illness for detailed discussion of her current management, blood sugar patterns and problems identified  She appears to had some improvement in her blood sugars with starting Trulicity 1.19 mg weekly and has not had any nausea with this Her A1c is over 9% on her last visit Has postprandial hyperglycemia more significantly than fasting usually because of difficulty with complying with her meal planning Currently is doing only minimal readings after meals and recently not clear if her postprandial readings are better Overall blood sugars are somewhat better although fasting readings are also still relatively high She can continue to improve her diet Also can do more exercise regularly  Recommendations:  Regular exercise regimen  To get better information on her blood sugar patterns she will start using the freestyle [place] system.  Discussed in detail how this is used and given her patient information on this.  She will use this consistently until her next visit  Increase Trulicity up to 1.5 mg daily  Continue to improve diet with smaller portions and lower fat choices  Recheck A1c in 2 months   There are no Patient Instructions on file for this visit.   Johnthomas Lader 08/09/2016, 1:07 PM   Note: This office note was prepared with Dragon voice recognition system technology. Any transcriptional errors that result from this process are unintentional.

## 2016-08-17 ENCOUNTER — Other Ambulatory Visit: Payer: Self-pay | Admitting: Endocrinology

## 2016-08-21 DIAGNOSIS — E1351 Other specified diabetes mellitus with diabetic peripheral angiopathy without gangrene: Secondary | ICD-10-CM | POA: Diagnosis not present

## 2016-08-21 DIAGNOSIS — M25572 Pain in left ankle and joints of left foot: Secondary | ICD-10-CM | POA: Diagnosis not present

## 2016-08-21 DIAGNOSIS — M722 Plantar fascial fibromatosis: Secondary | ICD-10-CM | POA: Diagnosis not present

## 2016-08-21 DIAGNOSIS — M76822 Posterior tibial tendinitis, left leg: Secondary | ICD-10-CM | POA: Diagnosis not present

## 2016-08-27 ENCOUNTER — Other Ambulatory Visit: Payer: Self-pay | Admitting: Endocrinology

## 2016-09-03 DIAGNOSIS — R110 Nausea: Secondary | ICD-10-CM | POA: Diagnosis not present

## 2016-09-03 DIAGNOSIS — Z1211 Encounter for screening for malignant neoplasm of colon: Secondary | ICD-10-CM | POA: Diagnosis not present

## 2016-09-03 DIAGNOSIS — K30 Functional dyspepsia: Secondary | ICD-10-CM | POA: Diagnosis not present

## 2016-09-10 ENCOUNTER — Telehealth: Payer: Self-pay | Admitting: Endocrinology

## 2016-09-10 ENCOUNTER — Other Ambulatory Visit: Payer: Self-pay

## 2016-09-10 MED ORDER — DULAGLUTIDE 1.5 MG/0.5ML ~~LOC~~ SOAJ: SUBCUTANEOUS | 2 refills | Status: DC

## 2016-09-10 NOTE — Telephone Encounter (Signed)
Ordered

## 2016-09-10 NOTE — Telephone Encounter (Signed)
Please send in a 90 day supply [facility]ill [facility] tr[facility]icity

## 2016-10-07 ENCOUNTER — Other Ambulatory Visit (INDEPENDENT_AMBULATORY_CARE_PROVIDER_SITE_OTHER): Payer: BLUE CROSS/BLUE SHIELD

## 2016-10-07 DIAGNOSIS — Z794 Long term (current) use of insulin: Secondary | ICD-10-CM | POA: Diagnosis not present

## 2016-10-07 DIAGNOSIS — E1165 Type 2 diabetes mellitus with hyperglycemia: Secondary | ICD-10-CM

## 2016-10-07 LAB — BASIC METABOLIC PANEL
BUN: 20 mg/dL (ref 6–23)
CHLORIDE: 102 meq/L (ref 96–112)
CO2: 29 meq/L (ref 19–32)
CREATININE: 0.89 mg/dL (ref 0.40–1.20)
Calcium: 10 mg/dL (ref 8.4–10.5)
GFR: 86.13 mL/min (ref >60.00)
Glucose, Bld: 181 mg/dL — ABNORMAL HIGH (ref 70–99)
Potassium: 3.9 mEq/L (ref 3.5–5.1)
Sodium: 138 mEq/L (ref 135–145)

## 2016-10-07 LAB — HEMOGLOBIN A1C: Hgb A1c MFr Bld: 7.7 % — ABNORMAL HIGH (ref 4.6–6.5)

## 2016-10-09 ENCOUNTER — Ambulatory Visit (INDEPENDENT_AMBULATORY_CARE_PROVIDER_SITE_OTHER): Payer: BLUE CROSS/BLUE SHIELD | Admitting: Endocrinology

## 2016-10-09 ENCOUNTER — Encounter: Payer: Self-pay | Admitting: Endocrinology

## 2016-10-09 VITALS — BP 124/86 | HR 93 | Ht 68.0 in | Wt 243.0 lb

## 2016-10-09 DIAGNOSIS — Z794 Long term (current) use of insulin: Secondary | ICD-10-CM

## 2016-10-09 DIAGNOSIS — E1165 Type 2 diabetes mellitus with hyperglycemia: Secondary | ICD-10-CM

## 2016-10-09 NOTE — Patient Instructions (Addendum)
Scan at bedtime daily  Walk 5/7 days  Skin Tac

## 2016-10-09 NOTE — Progress Notes (Signed)
Patient ID: Alyssa Ross, female   DOB: [birth year], 51 y.o.   MRN: [id number]   Reason for Appointment: Diabetes follow-up   History of Present Illness   Diagnosis: Type 2 DIABETES MELITUS, date of diagnosis 2005   She had previously been on metformin and Amaryl Because of inadequate control she was given Victoza in addition in 2011 With this her blood sugars have been significantly better and her A1c has been as low as 6.2 in 2013 Her blood sugars have been more difficult to control since 2014 an A1c consistently over 7% She  was started on insulin with Levemir in 06/2013 because of  increase in her A1c to 8.5% She had previously been taking Victoza along with Amaryl and metformin without consistent control Also had difficulty losing weight despite taking 1.8 mg Victoza, also had tried the 3 mg dose Because of episodic nausea and diarrhea in 1/16 she went off her Victoza  RECENT history:   INSULIN dose: TRESIBA 40 units pm.  Humalog  none Non-insulin hypoglycemic drugs:  metformin 2229 mg, Trulicity 1.5 mg weekly      She has had persistent difficulty getting her blood sugars under control  A1c is not surprisingly lower at 7.7, previously 9.3  She refuses to consider Invokana, V-go insulin pump, weight loss medications  Current management, blood sugars and problems identified as follows  She has gone up to 1.5 mg Trulicity and is now not complaining of nausea  Also she is using the FreeStyle sensor to monitor her blood sugar although she thinks it is reading lower than her actual blood sugars by anywhere from 20-60 mg; however on the day she had her lab work her blood sugar appears to be similar on the sensor to the lab work  She is also having some difficulty in getting the sensor to stick consistently  She is canning her sensor mostly during the day and only has 2 or 3 days when she has information available for after her evening meal where glucose appears to be going  higher consistently  FASTING blood sugars are fairly good and overnight blood sugars appear to be decreasing through the night  HIGHEST blood sugars are in the evenings around suppertime although her evening meal is at variable times  Previously was having HIGH blood sugars before supper time also and these are not is consistently high she thinks she is doing most of her eating later in the evening  She has been reluctant to take Humalog in the past for fear of weight gain  She has not changed her [place]  Her exercise regimen is inconsistent again but her weight is about the same     Side effects from medications: None  Monitors blood glucose: Now with freestyle Libre sensor  Blood Glucose readings from review of monitor download today  Mean values apply above for all meters except median for One Touch  PRE-MEAL Fasting Lunch Dinner Bedtime Overall  Glucose range:       Mean/median: 124  137  200  188  154    POST-MEAL PC Breakfast PC Lunch PC Dinner  Glucose range:     Mean/median:   212    Average blood sugar on a daily basis ranges from 115 up to 198  Meals: 3 meals per day. Dinner 8-9 pm    DIET: As above, Not consistently watching high-fat foods, eating some Sweets  Physical activity: exercise: 1-3 days a week to the gym for 45 minutes  Wt Readings from Last 3 Encounters:  10/09/16 243 lb (110.2 kg)  08/09/16 242 lb (109.8 kg)  07/12/16 245 lb (111.1 kg)    LABS:  Lab Results  Component Value Date   HGBA1C 7.7 (H) 10/07/2016   HGBA1C 9.3 (H) 07/09/2016   HGBA1C 8.4 (H) 04/09/2016   Lab Results  Component Value Date   MICROALBUR 1.0 01/08/2016   LDLCALC 77 12/20/2014   CREATININE 0.89 10/07/2016    Lab on 10/07/2016  Component Date Value Ref Range Status  . Hgb A1c MFr Bld 10/07/2016 7.7* 4.6 - 6.5 % Final  . Sodium 10/07/2016 138  135 - 145 mEq/L Final  . Potassium 10/07/2016 3.9  3.5 - 5.1 mEq/L Final  . Chloride 10/07/2016 102  96 -  112 mEq/L Final  . CO2 10/07/2016 29  19 - 32 mEq/L Final  . Glucose, Bld 10/07/2016 181* 70 - 99 mg/dL Final  . BUN 10/07/2016 20  6 - 23 mg/dL Final  . Creatinine, Ser 10/07/2016 0.89  0.40 - 1.20 mg/dL Final  . Calcium 10/07/2016 10.0  8.4 - 10.5 mg/dL Final  . GFR 10/07/2016 86.13  >60.00 mL/min Final    Allergies as of 10/09/2016      Reactions   Cherry Rash   Lemon Oil Rash      Medication List       Accurate as of 10/09/16  8:49 AM. Always use your most recent med list.          Dulaglutide 1.5 MG/0.5ML Sopn Commonly known as:  TRULICITY Inject weekly   FREESTYLE LIBRE READER Devi 1 Device by Does not apply route as directed.   FREESTYLE LIBRE SENSOR SYSTEM Misc 1 strip by Does not apply route once a week. Apply to upper arm and change sensor every 10 days   insulin lispro 100 UNIT/ML KiwkPen Commonly known as:  HUMALOG KWIKPEN Inject 8 units three times a day with meals.   Insulin Pen Needle 32G X 5 MM Misc Commonly known as:  NOVOTWIST Use two per day to inject insulin   Insulin Pen Needle 32G X 4 MM Misc Commonly known as:  NOVOFINE PLUS Use 3 per day to inject insulin   B-D ULTRAFINE III SHORT PEN 31G X 8 MM Misc Generic drug:  Insulin Pen Needle USE 3 NEEDLES PER DAY   lisinopril-hydrochlorothiazide 10-12.5 MG tablet Commonly known as:  PRINZIDE,ZESTORETIC Take 1 tablet by mouth daily. Takes 1 tablet Monday, Wednesday and Friday   metFORMIN 750 MG 24 hr tablet Commonly known as:  GLUCOPHAGE-XR TAKE 3 TABLETS (2,250 MG TOTAL) DAILY   omeprazole 20 MG capsule Commonly known as:  PRILOSEC Take 1 capsule (20 mg total) by mouth daily.   ONE TOUCH DELICA LANCING DEV Misc Use to check blood sugar 2 times per day   ONE TOUCH ULTRA TEST test strip Generic drug:  glucose blood USE AS INSTRUCTED TO CHECK BLOOD SUGAR TWICE A DAY   ONETOUCH DELICA LANCETS 16S Misc USE TO CHECK BLOOD SUGARS TWICE A DAY   potassium chloride SA 20 MEQ tablet Commonly  known as:  K-DUR,KLOR-CON TAKE 1 TABLET DAILY   prochlorperazine 10 MG tablet Commonly known as:  COMPAZINE Take 1 tablet (10 mg total) by mouth every 6 (six) hours as needed for nausea or vomiting.   rosuvastatin 20 MG tablet Commonly known as:  CRESTOR TAKE 1 TABLET DAILY   TRESIBA FLEXTOUCH 100 UNIT/ML Sopn FlexTouch Pen Generic drug:  insulin degludec INJECT 40 UNITS  UNDER THE SKIN DAILY AS DIRECTED   Vitamin D (Ergocalciferol) 50000 units Caps capsule Commonly known as:  DRISDOL Take 50,000 Units by mouth 2 (two) times a week.   Vitamin D3 5000 units Caps Take by mouth.       Allergies:  Allergies  Allergen Reactions  . Cherry Rash  . Lemon Oil Rash    Past Medical History:  Diagnosis Date  . ASCUS (atypical squamous cells of undetermined significance) on Pap smear 08/06/1999  . Breast mass in female 2002   Left  . Diabetes mellitus without complication ([place])   . Fibroid uterus 2010  . Hypertension   . Irregular bleeding 2011  . 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 Yeast vaginitis 2006    Past Surgical History:  Procedure Laterality Date  . DILATATION & CURETTAGE/HYSTEROSCOPY WITH TRUECLEAR N/A 01/20/2014   Procedure: DILATATION & CURETTAGE/HYSTEROSCOPY WITH TRUCLEAR;  Surgeon: [name], MD;  Location: [facility];  Service: Gynecology;  Laterality: N/A;  . DILATATION & CURRETTAGE/HYSTEROSCOPY WITH RESECTOCOPE N/A 01/20/2014   Procedure: [place];  Surgeon: [name], MD;  Location: [facility];  Service: Gynecology;  Laterality: N/A;  . NO PAST SURGERIES      Family History  Problem Relation Age of Onset  . Diabetes Mother   . Diabetes Father   . Mental retardation Sister     Social History:  reports that she has never smoked. She has never used smokeless tobacco. She reports that she drinks alcohol. She reports that she does not use drugs.  REVIEW of systems:  She is being  followed by her PCP for hypertension with Zestoretic 20 mg.   Has had hypercholesterolemia, taking Crestor 20 mg with good control, has history of mild increase in triglycerides  LDL in 12/17 was 89 with triglycerides 110   Lab Results  Component Value Date   CHOL 158 12/20/2014   HDL 49.40 12/20/2014   LDLCALC 77 12/20/2014   LDLDIRECT 97.7 02/11/2014   TRIG 158.0 (H) 12/20/2014   CHOLHDL 3 12/20/2014      Examination:   BP 124/86   Pulse 93   Ht '5\' 8"'$  (1.727 m)   Wt 243 lb (110.2 kg)   SpO2 97%   BMI 36.95 kg/m   Body mass index is 36.95 kg/m.    Assessment/Plan:   Diabetes type 2, uncontrolled  See history of present illness for detailed discussion of her current management, blood sugar patterns and problems identified Blood sugars were reviewed in detail with patient with the download of the freestyle Libre sensor  Her A1c is now 7.7, much better than the previous level of 9.3 She appears to had progressive improvement with using Trulicity and now she is tolerating the 1.5 mg weekly Fasting readings are excellent  However she has a tendency to higher readings getting progressively higher in the afternoon towards late evening She may be doing a little better with being aware of her blood sugars throughout the day better She can continue to improve her diet Also can do more exercise regularly  Recommendations:  Regular exercise regimen  Recommended that she take 5 units of Humalog with her evening meal since most of her readings after eating are high, she can skip this if eating a salad or low carbohydrate meal.  Reassured her that this will not make her gain weight  Discussed postprandial blood sugar targets  Discussed A1c of under 7% as started  To check  blood sugar with her Libre sensor at bedtime every night  Recheck A1c in 3 months   Patient Instructions  Scan at bedtime daily  Walk 5/7 days  Counseling time on subjects discussed above is over  50% of today's 25 minute visit   Alyssa Ross 10/09/2016, 8:49 AM   Note: This office note was prepared with Estate agent. Any transcriptional errors that result from this process are unintentional.

## 2016-10-10 ENCOUNTER — Other Ambulatory Visit: Payer: Self-pay | Admitting: Endocrinology

## 2016-11-09 DIAGNOSIS — I10 Essential (primary) hypertension: Secondary | ICD-10-CM | POA: Diagnosis not present

## 2016-11-09 DIAGNOSIS — E785 Hyperlipidemia, unspecified: Secondary | ICD-10-CM | POA: Diagnosis not present

## 2016-11-09 DIAGNOSIS — Z79899 Other long term (current) drug therapy: Secondary | ICD-10-CM | POA: Diagnosis not present

## 2016-11-09 DIAGNOSIS — Z6836 Body mass index (BMI) 36.0-36.9, adult: Secondary | ICD-10-CM | POA: Diagnosis not present

## 2016-11-20 ENCOUNTER — Other Ambulatory Visit: Payer: Self-pay | Admitting: Endocrinology

## 2016-12-15 ENCOUNTER — Other Ambulatory Visit: Payer: Self-pay | Admitting: Endocrinology

## 2017-01-06 ENCOUNTER — Other Ambulatory Visit (INDEPENDENT_AMBULATORY_CARE_PROVIDER_SITE_OTHER): Payer: BLUE CROSS/BLUE SHIELD

## 2017-01-06 DIAGNOSIS — E1165 Type 2 diabetes mellitus with hyperglycemia: Secondary | ICD-10-CM

## 2017-01-06 DIAGNOSIS — Z794 Long term (current) use of insulin: Secondary | ICD-10-CM

## 2017-01-06 LAB — COMPREHENSIVE METABOLIC PANEL
ALK PHOS: 50 U/L (ref 39–117)
ALT: 13 U/L (ref 0–35)
AST: 13 U/L (ref 0–37)
Albumin: 4 g/dL (ref 3.5–5.2)
BILIRUBIN TOTAL: 0.3 mg/dL (ref 0.2–1.2)
BUN: 19 mg/dL (ref 6–23)
CO2: 28 mEq/L (ref 19–32)
CREATININE: 0.87 mg/dL (ref 0.40–1.20)
Calcium: 9.5 mg/dL (ref 8.4–10.5)
Chloride: 102 mEq/L (ref 96–112)
GFR: 88.33 mL/min (ref >60.00)
GLUCOSE: 178 mg/dL — AB (ref 70–99)
Potassium: 3.7 mEq/L (ref 3.5–5.1)
Sodium: 138 mEq/L (ref 135–145)
TOTAL PROTEIN: 7.4 g/dL (ref 6.0–8.3)

## 2017-01-06 LAB — HEMOGLOBIN A1C: HEMOGLOBIN A1C: 7.7 % — AB (ref 4.6–6.5)

## 2017-01-06 LAB — MICROALBUMIN / CREATININE URINE RATIO
Creatinine,U: 213 mg/dL
MICROALB/CREAT RATIO: 0.7 mg/g (ref 0.0–30.0)
Microalb, Ur: 1.5 mg/dL (ref 0.0–1.9)

## 2017-01-09 ENCOUNTER — Ambulatory Visit (INDEPENDENT_AMBULATORY_CARE_PROVIDER_SITE_OTHER): Payer: BLUE CROSS/BLUE SHIELD | Admitting: Endocrinology

## 2017-01-09 VITALS — BP 114/78 | HR 95 | Ht 68.0 in | Wt 241.0 lb

## 2017-01-09 DIAGNOSIS — Z794 Long term (current) use of insulin: Secondary | ICD-10-CM | POA: Diagnosis not present

## 2017-01-09 DIAGNOSIS — E1165 Type 2 diabetes mellitus with hyperglycemia: Secondary | ICD-10-CM

## 2017-01-09 NOTE — Progress Notes (Signed)
Patient ID: Alyssa Ross, female   DOB: 1965/07/06, 51 y.o.   MRN: [id number]   Reason for Appointment: Diabetes follow-up   History of Present Illness   Diagnosis: Type 2 DIABETES MELITUS, date of diagnosis 2005   She had previously been on metformin and Amaryl Because of inadequate control she was given Victoza in addition in 2011 With this her blood sugars have been significantly better and her A1c has been as low as 6.2 in 2013 Her blood sugars have been more difficult to control since 2014 an A1c consistently over 7% She  was started on insulin with Levemir in 06/2013 because of  increase in her A1c to 8.5% She had previously been taking Victoza along with Amaryl and metformin without consistent control Also had difficulty losing weight despite taking 1.8 mg Victoza, also had tried the 3 mg dose Because of episodic nausea and diarrhea in 1/16 she went off her Victoza  RECENT history:   INSULIN dose: TRESIBA 40 units pm.  Humalog  none Non-insulin hypoglycemic drugs:  metformin 5102 mg, Trulicity 1.5 mg weekly      A1c is not improved, still 7.7 although has been as high as 9.3 earlier  She refuses to consider Invokana, V-go insulin pump, weight loss medications  Current management, blood sugars and problems identified as follows  She has continued 1.5 mg Trulicity without difficulty  She still has relatively high postprandial blood sugars  Although she is using the FreeStyle sensor to monitor her blood sugar she does not do this consistently and has readings for only about a week in the last 2 weeks download  Again she thinks that this is reading falsely low by about 20 mg  FASTING blood sugars are fairly good and overnight blood sugars are fairly near normal  However she tends to have little Dawn phenomenon  HIGHEST blood sugars are in the evenings around suppertime with average blood sugars around 180-190 usually  She also may tend to have snacks in the late  afternoon  She has been reluctant to take Humalog in the past for fear of weight gain, was told to try at least 5 units with her evening meals when she is eating significant amount of carbohydrate but she again refused to do so  She has not changed her [place]  Her exercise regimen is inconsistent again and she has not done any in the last month, she says she does not get any time or energy for this     Side effects from medications: None  Monitors blood glucose: Now with freestyle Libre sensor  Blood Glucose readings from review of monitor download today  Mean values apply above for all meters except median for One Touch  PRE-MEAL Fasting Lunch Dinner 4-6 AM  Overall  Glucose range:       Mean/median: 122    114  149+/-42    POST-MEAL PC Breakfast PC Lunch PC Dinner  Glucose range:     Mean/median: 139  161  192     Average blood sugar on a daily basis ranges from 119 up to 174  Meals: 3 meals per day. Dinner 8-9 pm    DIET: As above, Not consistently watching high-fat foods, eating some Sweets  Physical activity: exercise: Currently not going to the gym              Wt Readings from Last 3 Encounters:  01/09/17 241 lb (109.3 kg)  10/09/16 243 lb (110.2 kg)  08/09/16  242 lb (109.8 kg)    LABS:  Lab Results  Component Value Date   HGBA1C 7.7 (H) 01/06/2017   HGBA1C 7.7 (H) 10/07/2016   HGBA1C 9.3 (H) 07/09/2016   Lab Results  Component Value Date   MICROALBUR 1.5 01/06/2017   LDLCALC 77 12/20/2014   CREATININE 0.87 01/06/2017    Lab on 01/06/2017  Component Date Value Ref Range Status  . Hgb A1c MFr Bld 01/06/2017 7.7* 4.6 - 6.5 % Final   Glycemic Control Guidelines for People with Diabetes:Non Diabetic:  <6%Goal of Therapy: <7%Additional Action Suggested:  >8%   . Sodium 01/06/2017 138  135 - 145 mEq/L Final  . Potassium 01/06/2017 3.7  3.5 - 5.1 mEq/L Final  . Chloride 01/06/2017 102  96 - 112 mEq/L Final  . CO2 01/06/2017 28  19 - 32 mEq/L Final  .  Glucose, Bld 01/06/2017 178* 70 - 99 mg/dL Final  . BUN 01/06/2017 19  6 - 23 mg/dL Final  . Creatinine, Ser 01/06/2017 0.87  0.40 - 1.20 mg/dL Final  . Total Bilirubin 01/06/2017 0.3  0.2 - 1.2 mg/dL Final  . Alkaline Phosphatase 01/06/2017 50  39 - 117 U/L Final  . AST 01/06/2017 13  0 - 37 U/L Final  . ALT 01/06/2017 13  0 - 35 U/L Final  . Total Protein 01/06/2017 7.4  6.0 - 8.3 g/dL Final  . Albumin 01/06/2017 4.0  3.5 - 5.2 g/dL Final  . Calcium 01/06/2017 9.5  8.4 - 10.5 mg/dL Final  . GFR 01/06/2017 88.33  >60.00 mL/min Final  . Microalb, Ur 01/06/2017 1.5  0.0 - 1.9 mg/dL Final  . Creatinine,U 01/06/2017 213.0  mg/dL Final  . Microalb Creat Ratio 01/06/2017 0.7  0.0 - 30.0 mg/g Final    Allergies as of 01/09/2017      Reactions   Cherry Rash   Lemon Oil Rash      Medication List       Accurate as of 01/09/17 12:30 PM. Always use your most recent med list.          Dulaglutide 1.5 MG/0.5ML Sopn Commonly known as:  TRULICITY Inject weekly   FREESTYLE LIBRE READER Devi 1 Device by Does not apply route as directed.   FREESTYLE LIBRE SENSOR SYSTEM Misc 1 strip by Does not apply route once a week. Apply to upper arm and change sensor every 10 days   insulin lispro 100 UNIT/ML KiwkPen Commonly known as:  HUMALOG KWIKPEN Inject 8 units three times a day with meals.   Insulin Pen Needle 32G X 5 MM Misc Commonly known as:  NOVOTWIST Use two per day to inject insulin   Insulin Pen Needle 32G X 4 MM Misc Commonly known as:  NOVOFINE PLUS Use 3 per day to inject insulin   B-D ULTRAFINE III SHORT PEN 31G X 8 MM Misc Generic drug:  Insulin Pen Needle USE 3 NEEDLES PER DAY   lisinopril-hydrochlorothiazide 10-12.5 MG tablet Commonly known as:  PRINZIDE,ZESTORETIC Take 1 tablet by mouth daily. Takes 1 tablet Monday, Wednesday and Friday   metFORMIN 750 MG 24 hr tablet Commonly known as:  GLUCOPHAGE-XR TAKE 3 TABLETS (2,250 MG TOTAL) DAILY   omeprazole 20 MG  capsule Commonly known as:  PRILOSEC Take 1 capsule (20 mg total) by mouth daily.   ONE TOUCH DELICA LANCING DEV Misc Use to check blood sugar 2 times per day   ONE TOUCH ULTRA TEST test strip Generic drug:  glucose blood USE AS  INSTRUCTED TO CHECK BLOOD SUGAR TWICE A DAY   ONETOUCH DELICA LANCETS 35K Misc USE TO CHECK BLOOD SUGARS TWICE A DAY   potassium chloride SA 20 MEQ tablet Commonly known as:  K-DUR,KLOR-CON TAKE 1 TABLET DAILY   prochlorperazine 10 MG tablet Commonly known as:  COMPAZINE Take 1 tablet (10 mg total) by mouth every 6 (six) hours as needed for nausea or vomiting.   rosuvastatin 20 MG tablet Commonly known as:  CRESTOR TAKE 1 TABLET DAILY   TRESIBA FLEXTOUCH 100 UNIT/ML Sopn FlexTouch Pen Generic drug:  insulin degludec INJECT 40 UNITS UNDER THE SKIN DAILY AS DIRECTED   TRESIBA FLEXTOUCH 100 UNIT/ML Sopn FlexTouch Pen Generic drug:  insulin degludec INJECT 40 UNITS UNDER THE SKIN DAILY   Vitamin D (Ergocalciferol) 50000 units Caps capsule Commonly known as:  DRISDOL Take 50,000 Units by mouth 2 (two) times a week.   Vitamin D3 5000 units Caps Take by mouth.       Allergies:  Allergies  Allergen Reactions  . Cherry Rash  . Lemon Oil Rash    Past Medical History:  Diagnosis Date  . ASCUS (atypical squamous cells of undetermined significance) on Pap smear 08/06/1999  . Breast mass in female 2002   Left  . Diabetes mellitus without complication ([place])   . Fibroid uterus 2010  . Hypertension   . Irregular bleeding 2011  . 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 Yeast vaginitis 2006    Past Surgical History:  Procedure Laterality Date  . DILATATION & CURETTAGE/HYSTEROSCOPY WITH TRUECLEAR N/A 01/20/2014   Procedure: DILATATION & CURETTAGE/HYSTEROSCOPY WITH TRUCLEAR;  Surgeon: [name], MD;  Location: [facility];  Service: Gynecology;  Laterality: N/A;  . DILATATION & CURRETTAGE/HYSTEROSCOPY WITH RESECTOCOPE  N/A 01/20/2014   Procedure: [place];  Surgeon: [name], MD;  Location: [facility];  Service: Gynecology;  Laterality: N/A;  . NO PAST SURGERIES      Family History  Problem Relation Age of Onset  . Diabetes Mother   . Diabetes Father   . Mental retardation Sister     Social History:  reports that she has never smoked. She has never used smokeless tobacco. She reports that she drinks alcohol. She reports that she does not use drugs.  REVIEW of systems:  She is being followed by her PCP for hypertension with Zestoretic 20 mg.   Has had hypercholesterolemia, taking Crestor 20 mg with good control, has history of mild increase in triglycerides  LDL in 12/17 was 89 with triglycerides 110   Lab Results  Component Value Date   CHOL 158 12/20/2014   HDL 49.40 12/20/2014   LDLCALC 77 12/20/2014   LDLDIRECT 97.7 02/11/2014   TRIG 158.0 (H) 12/20/2014   CHOLHDL 3 12/20/2014      Examination:   BP 114/78   Pulse 95   Ht 5\' 8"  (1.727 m)   Wt 241 lb (109.3 kg)   SpO2 97%   BMI 36.64 kg/m   Body mass index is 36.64 kg/m.    Assessment/Plan:   Diabetes type 2, uncontrolled  See history of present illness for detailed discussion of her current management, blood sugar patterns and problems identified Blood sugars were reviewed in detail with patient with the download of the freestyle Libre sensor  Her A1c is Again 7.7, not improved Although she has done well with using Trulicity compared to [place] she is still having post prandial hyperglycemia based on her diet Highest  blood sugars probably about 270 at home after meals She is not finding the time to exercise and still not motivated However her weight appears to be stabilized and now down a couple of pounds Fasting readings are excellent without overnight hypoglycemia with 40 units basal insulin  Recommendations:  Regular exercise regimen, she can at least find 10 minutes  sometime during the day to exercise  Recommended that she take 5 units of Humalog with her evening meal when she is planning to eat carbohydrates since most of her readings after eating are high.  Discussed that this will not make her gain weight  However she is unlikely to follow these instructions  Discussed postprandial blood sugar targets  She does need to try and get her A1c close to 7%  She needs to check her blood sugars with the freestyle Libre of more consistently  Discussed option of using Ozempic which would provide better control and this is covered by insurance but she refuses to consider this now as she does not like new medications   Recheck A1c in 3 months   Patient Instructions  Humalog 5 units and keep sugars in range Max 180  Walk daily  Ozempic weekly  Counseling time on subjects discussed above is over 50% of today's 25 minute visit   Tanga [place] 01/09/2017, 12:30 PM   Note: This office note was prepared with Estate agent. Any transcriptional errors that result from this process are unintentional.

## 2017-01-09 NOTE — Patient Instructions (Addendum)
Humalog 5 units and keep sugars in range Max 180  Walk daily  Ozempic weekly

## 2017-01-11 DIAGNOSIS — N760 Acute vaginitis: Secondary | ICD-10-CM | POA: Diagnosis not present

## 2017-01-11 DIAGNOSIS — N898 Other specified noninflammatory disorders of vagina: Secondary | ICD-10-CM | POA: Diagnosis not present

## 2017-01-11 DIAGNOSIS — Z113 Encounter for screening for infections with a predominantly sexual mode of transmission: Secondary | ICD-10-CM | POA: Diagnosis not present

## 2017-01-11 DIAGNOSIS — Z7721 Contact with and (suspected) exposure to potentially hazardous body fluids: Secondary | ICD-10-CM | POA: Diagnosis not present

## 2017-01-29 ENCOUNTER — Other Ambulatory Visit: Payer: Self-pay | Admitting: Endocrinology

## 2017-02-13 ENCOUNTER — Other Ambulatory Visit: Payer: Self-pay | Admitting: Endocrinology

## 2017-02-18 ENCOUNTER — Other Ambulatory Visit: Payer: Self-pay | Admitting: Endocrinology

## 2017-04-02 ENCOUNTER — Telehealth: Payer: Self-pay | Admitting: Endocrinology

## 2017-04-02 NOTE — Telephone Encounter (Signed)
Patient needs prescription for Humalog sent to pharmacy ([facility] on [address]) She stopped taking Trulicity approx. 3 weeks ago. She still has a 4 month supply. She would like to try it again, but she wants to know if she needs to start on a lower dose. If so, please send a prescription to the pharmacy

## 2017-04-03 ENCOUNTER — Other Ambulatory Visit: Payer: Self-pay

## 2017-04-03 MED ORDER — DULAGLUTIDE 0.75 MG/0.5ML ~~LOC~~ SOAJ: SUBCUTANEOUS | 0 refills | Status: DC

## 2017-04-03 MED ORDER — INSULIN LISPRO 100 UNIT/ML (KWIKPEN): PEN_INJECTOR | SUBCUTANEOUS | 3 refills | Status: DC

## 2017-04-03 NOTE — Telephone Encounter (Signed)
Called patient and left a voice message to let her know to start with 0.75 mg weekly then after 4 weeks use the 1.5 mg dose weekly. I am sending to [place].

## 2017-04-03 NOTE — Telephone Encounter (Signed)
She will need to restart with 0.75 mg weekly for the first 4 injections and then 1.5

## 2017-04-03 NOTE — Telephone Encounter (Signed)
I have refilled the Humalog for the patient.  Please advise on her question about Trulicity.

## 2017-04-08 ENCOUNTER — Other Ambulatory Visit (INDEPENDENT_AMBULATORY_CARE_PROVIDER_SITE_OTHER): Payer: BLUE CROSS/BLUE SHIELD

## 2017-04-08 DIAGNOSIS — E1165 Type 2 diabetes mellitus with hyperglycemia: Secondary | ICD-10-CM | POA: Diagnosis not present

## 2017-04-08 DIAGNOSIS — Z794 Long term (current) use of insulin: Secondary | ICD-10-CM | POA: Diagnosis not present

## 2017-04-08 LAB — BASIC METABOLIC PANEL
BUN: 21 mg/dL (ref 6–23)
CALCIUM: 9.8 mg/dL (ref 8.4–10.5)
CHLORIDE: 100 meq/L (ref 96–112)
CO2: 30 meq/L (ref 19–32)
CREATININE: 0.76 mg/dL (ref 0.40–1.20)
GFR: 103.13 mL/min (ref >60.00)
Glucose, Bld: 144 mg/dL — ABNORMAL HIGH (ref 70–99)
Potassium: 4 mEq/L (ref 3.5–5.1)
Sodium: 138 mEq/L (ref 135–145)

## 2017-04-08 LAB — HEMOGLOBIN A1C: Hgb A1c MFr Bld: 7.9 % — ABNORMAL HIGH (ref 4.6–6.5)

## 2017-04-11 ENCOUNTER — Ambulatory Visit (INDEPENDENT_AMBULATORY_CARE_PROVIDER_SITE_OTHER): Payer: BLUE CROSS/BLUE SHIELD | Admitting: Endocrinology

## 2017-04-11 ENCOUNTER — Encounter: Payer: Self-pay | Admitting: Endocrinology

## 2017-04-11 VITALS — BP 136/86 | HR 76 | Ht 68.0 in | Wt 242.0 lb

## 2017-04-11 DIAGNOSIS — E1165 Type 2 diabetes mellitus with hyperglycemia: Secondary | ICD-10-CM | POA: Diagnosis not present

## 2017-04-11 DIAGNOSIS — Z794 Long term (current) use of insulin: Secondary | ICD-10-CM

## 2017-04-11 DIAGNOSIS — Z23 Encounter for immunization: Secondary | ICD-10-CM

## 2017-04-11 NOTE — Progress Notes (Signed)
Patient ID: Alyssa Ross, female   DOB: 05-25-1966, 51 y.o.   MRN: [id number]   Reason for Appointment: Diabetes follow-up   History of Present Illness   Diagnosis: Type 2 DIABETES MELITUS, date of diagnosis 2005   She had previously been on metformin and Amaryl Because of inadequate control she was given Victoza in addition in 2011 With this her blood sugars have been significantly better and her A1c has been as low as 6.2 in 2013 Her blood sugars have been more difficult to control since 2014 an A1c consistently over 7% She  was started on insulin with Levemir in 06/2013 because of  increase in her A1c to 8.5% She had previously been taking Victoza along with Amaryl and metformin without consistent control Also had difficulty losing weight despite taking 1.8 mg Victoza, also had tried the 3 mg dose Because of episodic nausea and diarrhea in 1/16 she went off her Victoza  RECENT history:   INSULIN dose: TRESIBA 44 units pm.  Humalog 10 units at lunch or dinner Non-insulin hypoglycemic drugs:  metformin 2683 mg, Trulicity 4.19 mg weekly      A1c is not improved, now 7.7  although has been as high as 9.3 earlier  She refuses to consider Invokana, V-go insulin pump, weight loss medications  Current management, blood sugars and problems identified as follows  She has been off Trulicity until 2 weeks ago because of severe nausea and was probably off treatment for 3-4 weeks  With this her blood sugars were much higher overall especially after meals and even fasting  Currently tolerating 6.22 mg Trulicity without difficulty, she said that she develops nausea after taking any GLP-1 drug for a longer period of time  She thinks her carbohydrate intake is not controlled and she is not doing as much with her diet with her not being on Trulicity  She can have significantly high readings after lunch or supper depending on which meal has increased calories or carbohydrate  However  has maintained her weight  She has gone up 4 units on the [place] on her own because of higher sugars  Also has started taking Humalog which she had previously refused to take because of significantly high readings and does not feel this causes hypoglycemia and also she is not afraid of weight gain with taking this now  However she is still not taking enough insulin because of continued high postprandial readings even with starting Trulicity back, mostly taking 10-15 units but mostly 10 units  Has not taken extra insulin when blood sugars are higher before her meal  Her exercise regimen is inconsistent again and she has not done any, she says she does not get any time or energy for this     Side effects from medications: None  Monitors blood glucose: Now with freestyle Libre sensor  Blood Glucose readings from review of monitor download today  Mean values apply above for all meters except median for One Touch  PRE-MEAL Fasting Lunch Dinner Bedtime Overall  Glucose range:  95-1 91    204    Mean/median: 139     192    POST-MEAL PC Breakfast PC Lunch PC Dinner  Glucose range:    Up to 427   Mean/median: 183  236  255     Meals: 3 meals per day. Dinner 8-9 pm    DIET: As above, Not consistently watching high-fat foods, eating some Sweets  Physical activity: exercise: Currently not going to  the gym  And does not find time to exercise at home            Wt Readings from Last 3 Encounters:  04/11/17 242 lb (109.8 kg)  01/09/17 241 lb (109.3 kg)  10/09/16 243 lb (110.2 kg)    LABS:  Lab Results  Component Value Date   HGBA1C 7.9 (H) 04/08/2017   HGBA1C 7.7 (H) 01/06/2017   HGBA1C 7.7 (H) 10/07/2016   Lab Results  Component Value Date   MICROALBUR 1.5 01/06/2017   LDLCALC 77 12/20/2014   CREATININE 0.76 04/08/2017    Lab on 04/08/2017  Component Date Value Ref Range Status  . Hgb A1c MFr Bld 04/08/2017 7.9* 4.6 - 6.5 % Final   Glycemic Control Guidelines for People  with Diabetes:Non Diabetic:  <6%Goal of Therapy: <7%Additional Action Suggested:  >8%   . Sodium 04/08/2017 138  135 - 145 mEq/L Final  . Potassium 04/08/2017 4.0  3.5 - 5.1 mEq/L Final  . Chloride 04/08/2017 100  96 - 112 mEq/L Final  . CO2 04/08/2017 30  19 - 32 mEq/L Final  . Glucose, Bld 04/08/2017 144* 70 - 99 mg/dL Final  . BUN 04/08/2017 21  6 - 23 mg/dL Final  . Creatinine, Ser 04/08/2017 0.76  0.40 - 1.20 mg/dL Final  . Calcium 04/08/2017 9.8  8.4 - 10.5 mg/dL Final  . GFR 04/08/2017 103.13  >60.00 mL/min Final    Allergies as of 04/11/2017      Reactions   Cherry Rash   Lemon Oil Rash      Medication List       Accurate as of 04/11/17  9:08 AM. Always use your most recent med list.          Dulaglutide 1.5 MG/0.5ML Sopn Commonly known as:  TRULICITY Inject weekly   Dulaglutide 0.75 MG/0.5ML Sopn Commonly known as:  TRULICITY Inject 6.04 mg into skin once a week for 4 weeks   FREESTYLE LIBRE READER Devi 1 Device by Does not apply route as directed.   FREESTYLE LIBRE SENSOR SYSTEM Misc APPLY TO UPPER ARM AND CHANGE SENSOR EVERY 10 DAYS   insulin lispro 100 UNIT/ML KiwkPen Commonly known as:  HUMALOG KWIKPEN Inject 5 units three times a day with meals.   Insulin Pen Needle 32G X 5 MM Misc Commonly known as:  NOVOTWIST Use two per day to inject insulin   Insulin Pen Needle 32G X 4 MM Misc Commonly known as:  NOVOFINE PLUS Use 3 per day to inject insulin   B-D ULTRAFINE III SHORT PEN 31G X 8 MM Misc Generic drug:  Insulin Pen Needle USE 3 NEEDLES PER DAY   lisinopril-hydrochlorothiazide 10-12.5 MG tablet Commonly known as:  PRINZIDE,ZESTORETIC Take 1 tablet by mouth daily. Takes 1 tablet Monday, Wednesday and Friday   metFORMIN 750 MG 24 hr tablet Commonly known as:  GLUCOPHAGE-XR TAKE 3 TABLETS (2,250 MG TOTAL) DAILY   omeprazole 20 MG capsule Commonly known as:  PRILOSEC Take 1 capsule (20 mg total) by mouth daily.   ONE TOUCH DELICA LANCING  DEV Misc Use to check blood sugar 2 times per day   ONE TOUCH ULTRA TEST test strip Generic drug:  glucose blood USE AS INSTRUCTED TO CHECK BLOOD SUGAR TWICE A DAY   ONETOUCH DELICA LANCETS 54U Misc USE TO CHECK BLOOD SUGARS TWICE A DAY   potassium chloride SA 20 MEQ tablet Commonly known as:  K-DUR,KLOR-CON TAKE 1 TABLET DAILY   prochlorperazine 10 MG tablet  Commonly known as:  COMPAZINE Take 1 tablet (10 mg total) by mouth every 6 (six) hours as needed for nausea or vomiting.   rosuvastatin 20 MG tablet Commonly known as:  CRESTOR TAKE 1 TABLET DAILY   TRESIBA FLEXTOUCH 100 UNIT/ML Sopn FlexTouch Pen Generic drug:  insulin degludec INJECT 40 UNITS UNDER THE SKIN DAILY   Vitamin D (Ergocalciferol) 50000 units Caps capsule Commonly known as:  DRISDOL Take 50,000 Units by mouth 2 (two) times a week.   Vitamin D3 5000 units Caps Take by mouth.       Allergies:  Allergies  Allergen Reactions  . Cherry Rash  . Lemon Oil Rash    Past Medical History:  Diagnosis Date  . ASCUS (atypical squamous cells of undetermined significance) on Pap smear 08/06/1999  . Breast mass in female 2002   Left  . Diabetes mellitus without complication ([place])   . Fibroid uterus 2010  . Hypertension   . Irregular bleeding 2011  . 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 Yeast vaginitis 2006    Past Surgical History:  Procedure Laterality Date  . DILATATION & CURETTAGE/HYSTEROSCOPY WITH TRUECLEAR N/A 01/20/2014   Procedure: DILATATION & CURETTAGE/HYSTEROSCOPY WITH TRUCLEAR;  Surgeon: [name], MD;  Location: [facility];  Service: Gynecology;  Laterality: N/A;  . DILATATION & CURRETTAGE/HYSTEROSCOPY WITH RESECTOCOPE N/A 01/20/2014   Procedure: [place];  Surgeon: [name], MD;  Location: [facility];  Service: Gynecology;  Laterality: N/A;  . NO PAST SURGERIES      Family History  Problem Relation Age of Onset    . Diabetes Mother   . Diabetes Father   . Mental retardation Sister     Social History:  reports that she has never smoked. She has never used smokeless tobacco. She reports that she drinks alcohol. She reports that she does not use drugs.  REVIEW of systems:  She is being followed by her PCP for hypertension with Zestoretic 20 mg.   Has had hypercholesterolemia, taking Crestor 20 mg with good control, has history of mild increase in triglycerides  LDL in 12/17 was 89 with triglycerides 110, followed by PCP   Lab Results  Component Value Date   CHOL 158 12/20/2014   HDL 49.40 12/20/2014   LDLCALC 77 12/20/2014   LDLDIRECT 97.7 02/11/2014   TRIG 158.0 (H) 12/20/2014   CHOLHDL 3 12/20/2014      Examination:   BP 136/86   Pulse 76   Ht 5\' 8"  (1.727 m)   Wt 242 lb (109.8 kg)   SpO2 92%   BMI 36.80 kg/m   Body mass index is 36.8 kg/m.    Assessment/Plan:   Diabetes type 2, uncontrolled  See history of present illness for detailed discussion of her current management, blood sugar patterns and problems identified  Blood sugars and day-to-day management were reviewed with patient with the download of the freestyle Libre sensor  Her A1c is slightly higher at 7.9 As before she is having post prandial hyperglycemia and worsening control with going off Trulicity She is however taking Humalog for some of her meals which has helped but still has had tendency to blood sugars over 300 after meals at times Not exercising also at this time and diet can be better with reduce carbohydrate and fat intake Without Trulicity her fasting readings are also higher  Recommendations:  Regular exercise regimen, again reminded her that she can at least find 10 minutes sometime during  the day 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  She needs to take at least 10-15 units for covering her high carbohydrate meals  Also to take correction doses for high readings with the  1:35 factor  No change in [place] unless fasting readings are below 90  Discussed option of using Ozempic which would provide better control and much more flexibility with dosage adjustment even if she does not take the full 0.5 mg dose, showed her how the FlexPen works  She will consider this after her Trulicity runs out   Recheck A1c in 3 months   Influenza vaccine given    Patient Instructions  Take 3 units per 100 for hi sugars  Counseling time on subjects discussed above is over 50% of today's 25 minute visit   Jquan Egelston 04/11/2017, 9:08 AM   Note: This office note was prepared with Estate agent. Any transcriptional errors that result from this process are unintentional.

## 2017-04-11 NOTE — Patient Instructions (Addendum)
Take 3 units per 100 for hi sugars

## 2017-04-29 DIAGNOSIS — Z01419 Encounter for gynecological examination (general) (routine) without abnormal findings: Secondary | ICD-10-CM | POA: Diagnosis not present

## 2017-04-29 DIAGNOSIS — Z124 Encounter for screening for malignant neoplasm of cervix: Secondary | ICD-10-CM | POA: Diagnosis not present

## 2017-05-21 ENCOUNTER — Other Ambulatory Visit: Payer: Self-pay

## 2017-05-21 MED ORDER — METFORMIN HCL ER 750 MG PO TB24: ORAL_TABLET | ORAL | 2 refills | Status: DC

## 2017-05-22 ENCOUNTER — Other Ambulatory Visit: Payer: Self-pay

## 2017-05-22 MED ORDER — METFORMIN HCL ER 750 MG PO TB24: ORAL_TABLET | ORAL | 2 refills | Status: DC

## 2017-05-31 ENCOUNTER — Other Ambulatory Visit: Payer: Self-pay | Admitting: Endocrinology

## 2017-06-04 ENCOUNTER — Other Ambulatory Visit: Payer: Self-pay

## 2017-06-04 ENCOUNTER — Telehealth: Payer: Self-pay | Admitting: Endocrinology

## 2017-06-04 DIAGNOSIS — Z1231 Encounter for screening mammogram for malignant neoplasm of breast: Secondary | ICD-10-CM | POA: Diagnosis not present

## 2017-06-04 MED ORDER — INSULIN PEN NEEDLE 31G X 8 MM MISC: 0 refills | Status: DC

## 2017-06-04 NOTE — Telephone Encounter (Signed)
Pt is calling for refill on the BD pen needles to [facility] # [phone]

## 2017-06-04 NOTE — Telephone Encounter (Signed)
I have sent these to the pharmacy for the patient.

## 2017-06-05 DIAGNOSIS — J208 Acute bronchitis due to other specified organisms: Secondary | ICD-10-CM | POA: Diagnosis not present

## 2017-06-05 DIAGNOSIS — B9689 Other specified bacterial agents as the cause of diseases classified elsewhere: Secondary | ICD-10-CM | POA: Diagnosis not present

## 2017-06-14 ENCOUNTER — Other Ambulatory Visit: Payer: Self-pay | Admitting: Endocrinology

## 2017-06-14 DIAGNOSIS — Z0000 Encounter for general adult medical examination without abnormal findings: Secondary | ICD-10-CM | POA: Diagnosis not present

## 2017-06-14 DIAGNOSIS — Z794 Long term (current) use of insulin: Secondary | ICD-10-CM | POA: Diagnosis not present

## 2017-06-14 DIAGNOSIS — E1165 Type 2 diabetes mellitus with hyperglycemia: Secondary | ICD-10-CM | POA: Diagnosis not present

## 2017-06-14 DIAGNOSIS — I10 Essential (primary) hypertension: Secondary | ICD-10-CM | POA: Diagnosis not present

## 2017-06-18 ENCOUNTER — Telehealth: Payer: Self-pay | Admitting: Endocrinology

## 2017-06-18 NOTE — Telephone Encounter (Signed)
Pt stated she needs a 90 day script   insulin lispro (HUMALOG KWIKPEN) 100 UNIT/ML [employer]   [facility] [place]   Pt also said she is having to use more of that script. She said she is no longer taking   Dulaglutide (TRULICITY) 1.5 EV/0.3JK SOPN    Please advise

## 2017-06-20 ENCOUNTER — Other Ambulatory Visit: Payer: Self-pay

## 2017-06-20 MED ORDER — INSULIN LISPRO 100 UNIT/ML (KWIKPEN): PEN_INJECTOR | SUBCUTANEOUS | 3 refills | Status: DC

## 2017-06-20 MED ORDER — DULAGLUTIDE 1.5 MG/0.5ML ~~LOC~~ SOAJ: SUBCUTANEOUS | 2 refills | Status: DC

## 2017-06-20 NOTE — Telephone Encounter (Signed)
This has been done.

## 2017-07-08 ENCOUNTER — Other Ambulatory Visit (INDEPENDENT_AMBULATORY_CARE_PROVIDER_SITE_OTHER): Payer: BLUE CROSS/BLUE SHIELD

## 2017-07-08 DIAGNOSIS — E1165 Type 2 diabetes mellitus with hyperglycemia: Secondary | ICD-10-CM | POA: Diagnosis not present

## 2017-07-08 DIAGNOSIS — Z794 Long term (current) use of insulin: Secondary | ICD-10-CM

## 2017-07-08 LAB — BASIC METABOLIC PANEL
BUN: 21 mg/dL (ref 6–23)
CALCIUM: 10 mg/dL (ref 8.4–10.5)
CO2: 30 mEq/L (ref 19–32)
CREATININE: 0.93 mg/dL (ref 0.40–1.20)
Chloride: 100 mEq/L (ref 96–112)
GFR: 81.62 mL/min (ref >60.00)
GLUCOSE: 152 mg/dL — AB (ref 70–99)
Potassium: 4 mEq/L (ref 3.5–5.1)
Sodium: 138 mEq/L (ref 135–145)

## 2017-07-08 LAB — LIPID PANEL
CHOLESTEROL: 155 mg/dL (ref 0–200)
HDL: 48.2 mg/dL (ref >39.00)
LDL CALC: 86 mg/dL (ref 0–99)
NonHDL: 106.91
TRIGLYCERIDES: 105 mg/dL (ref 0.0–149.0)
Total CHOL/HDL Ratio: 3
VLDL: 21 mg/dL (ref 0.0–40.0)

## 2017-07-08 LAB — HEMOGLOBIN A1C: Hgb A1c MFr Bld: 9.3 % — ABNORMAL HIGH (ref 4.6–6.5)

## 2017-07-10 NOTE — Progress Notes (Signed)
Patient ID: Alyssa Ross, female   DOB: 24-Apr-1966, 52 y.o.   MRN: [id number]   Reason for Appointment: Diabetes follow-up   History of Present Illness   Diagnosis: Type 2 DIABETES MELITUS, date of diagnosis 2005   She had previously been on metformin and Amaryl Because of inadequate control she was given Victoza in addition in 2011 With this her blood sugars have been significantly better and her A1c has been as low as 6.2 in 2013 Her blood sugars have been more difficult to control since 2014 an A1c consistently over 7% She  was started on insulin with Levemir in 06/2013 because of  increase in her A1c to 8.5% She had previously been taking Victoza along with Amaryl and metformin without consistent control Also had difficulty losing weight despite taking 1.8 mg Victoza, also had tried the 3 mg dose Because of episodic nausea and diarrhea in 1/16 she went off her Victoza  RECENT history:   INSULIN dose: TRESIBA 44 units pm.  Humalog previously 10-12   Non-insulin hypoglycemic drugs:  metformin 2250 mg, Ozempic 0.25 mg weekly      A1c is now up to 9.3, previously as low as 7.7   She refuses to consider Invokana, V-go insulin pump, weight loss medications  Current management, blood sugars and problems identified as follows  She has been off Trulicity again because of persistent nausea and she did not report this, has been not taking this since late November  She says her blood sugars are much higher without Trulicity and sometimes up to 400 but she did not call and report is  She did go to see her primary care doctor last month and was given a sample of Ozempic to try  She is taking 0.25 mg weekly and has taken about 3 injections without any significant nausea, she may have mild nausea on and off  Although she was taking Humalog at her last visit she stopped taking this with her taking OZEMPIC  She appears to have persistently high blood sugars now throughout the day  and mostly higher later in the day with highest blood sugars late in the evening before bedtime  Also despite mostly high fasting readings she has not been increasing her dose of TRESIBA consistently and taking mostly 40 units  Her blood sugars are averaging around 200 even before dinnertime and may be sometimes significantly high with lunch  As usual she is not very compliant with following her diet  She even thinks that on Trulicity she was not having increased satiety except initially  Has gained weight     Side effects from medications: None  Monitors blood glucose: With freestyle Libre sensor  Blood Glucose readings from review of monitor download today  Mean values apply above for all meters except median for One Touch  PRE-MEAL Fasting Lunch Dinner Bedtime Overall  Glucose range:       Mean/median:  151  189  224  252 198   POST-MEAL PC Breakfast PC Lunch PC Dinner  Glucose range:     Mean/median:   203 271    PREVIOUS values: Mean values apply above for all meters except median for One Touch  PRE-MEAL Fasting Lunch Dinner Bedtime Overall  Glucose range:  95-1 91    204    Mean/median: 139     192    POST-MEAL PC Breakfast PC Lunch PC Dinner  Glucose range:    Up to 427   Mean/median: 183  236  255    Meals: 3 meals per day. Dinner 8-9 pm    DIET: As above, Not consistently watching high-fat foods, eating some Sweets  Physical activity: exercise: Currently not going to the gym  And does not find time to exercise at home            Wt Readings from Last 3 Encounters:  07/11/17 246 lb 6.4 oz (111.8 kg)  04/11/17 242 lb (109.8 kg)  01/09/17 241 lb (109.3 kg)    LABS:  Lab Results  Component Value Date   HGBA1C 9.3 (H) 07/08/2017   HGBA1C 7.9 (H) 04/08/2017   HGBA1C 7.7 (H) 01/06/2017   Lab Results  Component Value Date   MICROALBUR 1.5 01/06/2017   LDLCALC 86 07/08/2017   CREATININE 0.93 07/08/2017    Lab on 07/08/2017  Component Date Value Ref  Range Status  . Cholesterol 07/08/2017 155  0 - 200 mg/dL Final   ATP III Classification       Desirable:  < 200 mg/dL               Borderline High:  200 - 239 mg/dL          High:  > = 240 mg/dL  . Triglycerides 07/08/2017 105.0  0.0 - 149.0 mg/dL Final   Normal:  <150 mg/dLBorderline High:  150 - 199 mg/dL  . HDL 07/08/2017 48.20  >39.00 mg/dL Final  . VLDL 07/08/2017 21.0  0.0 - 40.0 mg/dL Final  . LDL Cholesterol 07/08/2017 86  0 - 99 mg/dL Final  . Total CHOL/HDL Ratio 07/08/2017 3   Final                  Men          Women1/2 Average Risk     3.4          3.3Average Risk          5.0          4.42X Average Risk          9.6          7.13X Average Risk          15.0          11.0                      . NonHDL 07/08/2017 106.91   Final   NOTE:  Non-HDL goal should be 30 mg/dL higher than patient's LDL goal (i.e. LDL goal of < 70 mg/dL, would have non-HDL goal of < 100 mg/dL)  . Sodium 07/08/2017 138  135 - 145 mEq/L Final  . Potassium 07/08/2017 4.0  3.5 - 5.1 mEq/L Final  . Chloride 07/08/2017 100  96 - 112 mEq/L Final  . CO2 07/08/2017 30  19 - 32 mEq/L Final  . Glucose, Bld 07/08/2017 152* 70 - 99 mg/dL Final  . BUN 07/08/2017 21  6 - 23 mg/dL Final  . Creatinine, Ser 07/08/2017 0.93  0.40 - 1.20 mg/dL Final  . Calcium 07/08/2017 10.0  8.4 - 10.5 mg/dL Final  . GFR 07/08/2017 81.62  >60.00 mL/min Final  . Hgb A1c MFr Bld 07/08/2017 9.3* 4.6 - 6.5 % Final   Glycemic Control Guidelines for People with Diabetes:Non Diabetic:  <6%Goal of Therapy: <7%Additional Action Suggested:  >8%     Allergies as of 07/11/2017      Reactions   Cherry Rash   Lemon Oil Rash  Medication List        Accurate as of 07/11/17 11:59 PM. Always use your most recent med list.          FREESTYLE LIBRE READER Devi 1 Device by Does not apply route as directed.   FREESTYLE LIBRE SENSOR SYSTEM Misc APPLY TO UPPER ARM AND CHANGE SENSOR EVERY 10 DAYS.Marland Kitchen   insulin lispro 100 UNIT/ML  KiwkPen Commonly known as:  HUMALOG KWIKPEN Inject 5 units three times a day with meals.   Insulin Pen Needle 32G X 5 MM Misc Commonly known as:  NOVOTWIST Use two per day to inject insulin   Insulin Pen Needle 32G X 4 MM Misc Commonly known as:  NOVOFINE PLUS Use 3 per day to inject insulin   Insulin Pen Needle 31G X 8 MM Misc Commonly known as:  B-D ULTRAFINE III SHORT PEN USE 3 NEEDLES PER DAY   lisinopril-hydrochlorothiazide 10-12.5 MG tablet Commonly known as:  PRINZIDE,ZESTORETIC Take 1 tablet by mouth daily. Takes 1 tablet Monday, Wednesday and Friday   metFORMIN 750 MG 24 hr tablet Commonly known as:  GLUCOPHAGE-XR TAKE 3 TABLETS (2,250 MG TOTAL) DAILY   omeprazole 20 MG capsule Commonly known as:  PRILOSEC Take 1 capsule (20 mg total) by mouth daily.   ONE TOUCH DELICA LANCING DEV Misc Use to check blood sugar 2 times per day   ONE TOUCH ULTRA TEST test strip Generic drug:  glucose blood USE AS INSTRUCTED TO CHECK BLOOD SUGAR TWICE A DAY   ONETOUCH DELICA LANCETS 83J Misc USE TO CHECK BLOOD SUGARS TWICE A DAY   potassium chloride SA 20 MEQ tablet Commonly known as:  K-DUR,KLOR-CON TAKE 1 TABLET DAILY   prochlorperazine 10 MG tablet Commonly known as:  COMPAZINE Take 1 tablet (10 mg total) by mouth every 6 (six) hours as needed for nausea or vomiting.   rosuvastatin 20 MG tablet Commonly known as:  CRESTOR TAKE 1 TABLET DAILY   Semaglutide 0.25 or 0.5 MG/DOSE Sopn Commonly known as:  OZEMPIC Inject 0.5 mg into the skin once a week.   TRESIBA FLEXTOUCH 100 UNIT/ML Sopn FlexTouch Pen Generic drug:  insulin degludec INJECT 40 UNITS UNDER THE SKIN DAILY   Vitamin D3 5000 units Caps Take by mouth.       Allergies:  Allergies  Allergen Reactions  . Cherry Rash  . Lemon Oil Rash    Past Medical History:  Diagnosis Date  . ASCUS (atypical squamous cells of undetermined significance) on Pap smear 08/06/1999  . Breast mass in female 2002   Left   . Diabetes mellitus without complication ([place])   . Fibroid uterus 2010  . Hypertension   . Irregular bleeding 2011  . 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 Yeast vaginitis 2006    Past Surgical History:  Procedure Laterality Date  . DILATATION & CURETTAGE/HYSTEROSCOPY WITH TRUECLEAR N/A 01/20/2014   Procedure: DILATATION & CURETTAGE/HYSTEROSCOPY WITH TRUCLEAR;  Surgeon: [name], MD;  Location: [facility];  Service: Gynecology;  Laterality: N/A;  . DILATATION & CURRETTAGE/HYSTEROSCOPY WITH RESECTOCOPE N/A 01/20/2014   Procedure: [place];  Surgeon: [name], MD;  Location: [facility];  Service: Gynecology;  Laterality: N/A;  . NO PAST SURGERIES      Family History  Problem Relation Age of Onset  . Diabetes Mother   . Diabetes Father   . Mental retardation Sister     Social History:  reports that  has never smoked. she has  never used smokeless tobacco. She reports that she drinks alcohol. She reports that she does not use drugs.  REVIEW of systems:  She is being followed by her PCP for hypertension with Zestoretic 20 mg, no recent changes made.   Has had hypercholesterolemia, taking Crestor 20 mg with good control, has history of mild increase in triglycerides Recent levels are better   Lab Results  Component Value Date   CHOL 155 07/08/2017   HDL 48.20 07/08/2017   LDLCALC 86 07/08/2017   LDLDIRECT 97.7 02/11/2014   TRIG 105.0 07/08/2017   CHOLHDL 3 07/08/2017      Examination:   BP 127/88 (BP Location: Left Arm, Patient Position: Sitting, Cuff Size: Large)   Pulse 85   Ht 5\' 8"  (1.727 m)   Wt 246 lb 6.4 oz (111.8 kg)   BMI 37.46 kg/m   Body mass index is 37.46 kg/m.    Assessment/Plan:   Diabetes type 2, uncontrolled  See history of present illness for detailed discussion of her current management, blood sugar patterns and problems identified  Blood sugars and day-to-day  management were reviewed with patient with the download of the freestyle Libre sensor  Her A1c is higher at 9.3  She appears to have had much worsening of her blood sugar but not taking Trulicity Again is having difficulty with postprandial hyperglycemia Blood sugars between 2 PM and 2 AM are consistently averaging over 200 on her sensor recording She is also not taking Humalog on her own despite seeing her blood sugars at times going around 350 postprandially Currently fasting blood sugars are mostly high and she is not consistently taking 44 units Tresiba  With starting a sample of Ozempic recently she is getting some improvement in her blood sugars but not adequately and only sometimes near normal overnight She is still not motivated to watch her diet or exercise with recent weight gain resulting  Recommendations:  She was shown how to increase her dose of Ozempic by at least 5 clicks once a week and then gradually work upwards to get to 0.5 weekly dose  Prescription and patient information given  She needs to take at least 44 units of Tresiba to get her fasting readings consistently below 120 and not reduce it arbitrarily  She will need to take at least Humalog for supper time based on her meal size and take higher doses for pre-meal hyperglycemia  Target of at least 180 discussed for postprandial readings  She will try to control portions, high fat and high carbohydrate meals, she thinks that with starting a weight loss program she may be agreed to comply better       Patient Instructions  44 Tresiba  Must take Humalog at dinner  Add 5 clicks on Ozempic   Counseling time on subjects discussed in assessment and plan sections is over 50% of today's 25 minute visit    Elayne Snare 07/12/2017, 4:11 PM   Note: This office note was prepared with Dragon voice recognition system technology. Any transcriptional errors that result from this process are unintentional.

## 2017-07-11 ENCOUNTER — Ambulatory Visit: Payer: BLUE CROSS/BLUE SHIELD | Admitting: Endocrinology

## 2017-07-11 ENCOUNTER — Encounter: Payer: Self-pay | Admitting: Endocrinology

## 2017-07-11 VITALS — BP 127/88 | HR 85 | Ht 68.0 in | Wt 246.4 lb

## 2017-07-11 DIAGNOSIS — E1165 Type 2 diabetes mellitus with hyperglycemia: Secondary | ICD-10-CM

## 2017-07-11 DIAGNOSIS — Z794 Long term (current) use of insulin: Secondary | ICD-10-CM

## 2017-07-11 MED ORDER — SEMAGLUTIDE(0.25 OR 0.5MG/DOS) 2 MG/1.5ML ~~LOC~~ SOPN: 0.5000 mg | PEN_INJECTOR | SUBCUTANEOUS | 2 refills | Status: DC

## 2017-07-11 NOTE — Patient Instructions (Signed)
44 Tresiba  Must take Humalog at dinner  Add 5 clicks on Ozempic

## 2017-07-12 ENCOUNTER — Encounter: Payer: Self-pay | Admitting: Endocrinology

## 2017-07-16 NOTE — Telephone Encounter (Signed)
Pt stated that pharmacy told her they could not fill script because it does not have  dosage Semaglutide (OZEMPIC) 0.25 or 0.5 MG/DOSE SOPN  And stated that they faxed over some paperwork for it  Please advise  [facility] [place], [place] - [address] AT [place]

## 2017-07-17 ENCOUNTER — Other Ambulatory Visit: Payer: Self-pay

## 2017-07-17 MED ORDER — SEMAGLUTIDE(0.25 OR 0.5MG/DOS) 2 MG/1.5ML ~~LOC~~ SOPN: 0.5000 mg | PEN_INJECTOR | SUBCUTANEOUS | 2 refills | Status: DC

## 2017-07-17 NOTE — Telephone Encounter (Signed)
This prescription has been sent again and to [facility] and it has the instructions on it.

## 2017-07-17 NOTE — Telephone Encounter (Signed)
Called patient and let her know that I have sent a revised prescription for 2 pens per the dosage written. I have included a note to the pharmacy.

## 2017-07-17 NOTE — Telephone Encounter (Signed)
Called [facility] and spoke to [place] and gave her the corrected prescription for 2 pens and she stated that she saw the revised prescription and would delete the first one that only had one pen on it.

## 2017-07-18 DIAGNOSIS — E669 Obesity, unspecified: Secondary | ICD-10-CM | POA: Diagnosis not present

## 2017-07-18 DIAGNOSIS — Z6836 Body mass index (BMI) 36.0-36.9, adult: Secondary | ICD-10-CM | POA: Diagnosis not present

## 2017-07-24 DIAGNOSIS — E119 Type 2 diabetes mellitus without complications: Secondary | ICD-10-CM | POA: Diagnosis not present

## 2017-07-24 DIAGNOSIS — R635 Abnormal weight gain: Secondary | ICD-10-CM | POA: Diagnosis not present

## 2017-07-24 DIAGNOSIS — E1165 Type 2 diabetes mellitus with hyperglycemia: Secondary | ICD-10-CM | POA: Diagnosis not present

## 2017-07-24 DIAGNOSIS — E669 Obesity, unspecified: Secondary | ICD-10-CM | POA: Diagnosis not present

## 2017-07-24 DIAGNOSIS — Z6836 Body mass index (BMI) 36.0-36.9, adult: Secondary | ICD-10-CM | POA: Diagnosis not present

## 2017-08-01 DIAGNOSIS — Z713 Dietary counseling and surveillance: Secondary | ICD-10-CM | POA: Diagnosis not present

## 2017-08-01 DIAGNOSIS — E669 Obesity, unspecified: Secondary | ICD-10-CM | POA: Diagnosis not present

## 2017-08-04 DIAGNOSIS — H2513 Age-related nuclear cataract, bilateral: Secondary | ICD-10-CM | POA: Diagnosis not present

## 2017-08-04 DIAGNOSIS — E119 Type 2 diabetes mellitus without complications: Secondary | ICD-10-CM | POA: Diagnosis not present

## 2017-08-04 LAB — HM DIABETES EYE EXAM

## 2017-08-12 ENCOUNTER — Other Ambulatory Visit: Payer: Self-pay | Admitting: Endocrinology

## 2017-08-12 DIAGNOSIS — E1165 Type 2 diabetes mellitus with hyperglycemia: Secondary | ICD-10-CM | POA: Diagnosis not present

## 2017-08-12 DIAGNOSIS — Z794 Long term (current) use of insulin: Secondary | ICD-10-CM | POA: Diagnosis not present

## 2017-08-12 DIAGNOSIS — E669 Obesity, unspecified: Secondary | ICD-10-CM | POA: Diagnosis not present

## 2017-08-12 DIAGNOSIS — Z6836 Body mass index (BMI) 36.0-36.9, adult: Secondary | ICD-10-CM | POA: Diagnosis not present

## 2017-08-29 ENCOUNTER — Encounter: Payer: Self-pay | Admitting: Endocrinology

## 2017-09-05 ENCOUNTER — Other Ambulatory Visit (INDEPENDENT_AMBULATORY_CARE_PROVIDER_SITE_OTHER): Payer: BLUE CROSS/BLUE SHIELD

## 2017-09-05 DIAGNOSIS — E1165 Type 2 diabetes mellitus with hyperglycemia: Secondary | ICD-10-CM

## 2017-09-05 DIAGNOSIS — Z794 Long term (current) use of insulin: Secondary | ICD-10-CM

## 2017-09-05 LAB — GLUCOSE, RANDOM: Glucose, Bld: 161 mg/dL — ABNORMAL HIGH (ref 70–99)

## 2017-09-06 LAB — FRUCTOSAMINE: Fructosamine: 332 umol/L — ABNORMAL HIGH (ref 0–285)

## 2017-09-07 NOTE — Progress Notes (Signed)
Patient ID: Alyssa Ross, female   DOB: April 01, 1966, 52 y.o.   MRN: [id number]   Reason for Appointment: Diabetes follow-up   History of Present Illness   Diagnosis: Type 2 DIABETES MELITUS, date of diagnosis 2005   She had previously been on metformin and Amaryl Because of inadequate control she was given Victoza in addition in 2011 With this her blood sugars have been significantly better and her A1c has been as low as 6.2 in 2013 Her blood sugars have been more difficult to control since 2014 an A1c consistently over 7% She  was started on insulin with Levemir in 06/2013 because of  increase in her A1c to 8.5% She had previously been taking Victoza along with Amaryl and metformin without consistent control Also had difficulty losing weight despite taking 1.8 mg Victoza, also had tried the 3 mg dose Because of episodic nausea and diarrhea in 1/16 she went off her Victoza  RECENT history:   INSULIN dose: TRESIBA 44 units pm.  Humalog 9, previously 10-12  Non-insulin hypoglycemic drugs:  metformin 2250 mg, Ozempic 0.5 mg weekly      A1c in 1/19 was 9.3, previously as low as 7.7  Fructosamine is now 332  She refuses to consider Invokana, insulin pump, weight loss medications  Current management, blood sugars and problems identified as follows  She has been continuing on [place] since about 07/2017  She has been able to increase the dose up to 0.5 mg for at least a month now and has no nausea  She does appear to have increased satiety but has not lost any weight  She says that she was told that she should not go too long without eating and may tend to eat more often than needed  Again she is not able to watch her diet and eating more higher carbohydrate and high fat meals especially at lunch and sometimes at supper also  Even though she takes Humalog with her during the day she never takes it at lunch  Postprandial readings have been as high as 310  FASTING blood  sugars are variably high and averaging about 140 with may be close to 100 on some nights  Despite this she may sometimes reduce her [place] when she is eating lighter meals  HIGHEST blood sugar on an average is between 4-6 PM  Despite reminders she does not exercise, she says she does not have time to do this     Side effects from medications: None  Monitors blood glucose: With freestyle Libre sensor  Blood Glucose averages by time of day from review of download today  CGM use %  67  Average and SD  176+/-53  Time in range  58  % Time Above 180  42  % Time above 250   % Time Below target  0      PRE-MEAL Fasting Lunch Dinner Bedtime Overall  Glucose range:       Mean/median:  143  174  213   176   POST-MEAL PC Breakfast PC Lunch PC Dinner  Glucose range:     Mean/median:   232  211    PREVIOUS values: Mean values apply above for all meters except median for One Touch  PRE-MEAL Fasting Lunch Dinner Bedtime Overall  Glucose range:       Mean/median:  151  189  224  252 198   POST-MEAL PC Breakfast PC Lunch PC Dinner  Glucose range:     Mean/median:  203 271     Meals: 3 meals per day. Dinner 8-9 pm    DIET: As above, Not consistently watching high-fat foods, eating some Sweets  Physical activity: exercise: Currently not going to the gym  And does not find time to exercise at home            Wt Readings from Last 3 Encounters:  09/08/17 247 lb (112 kg)  07/11/17 246 lb 6.4 oz (111.8 kg)  04/11/17 242 lb (109.8 kg)    LABS:  Lab Results  Component Value Date   HGBA1C 9.3 (H) 07/08/2017   HGBA1C 7.9 (H) 04/08/2017   HGBA1C 7.7 (H) 01/06/2017   Lab Results  Component Value Date   MICROALBUR 1.5 01/06/2017   LDLCALC 86 07/08/2017   CREATININE 0.93 07/08/2017    Lab on 09/05/2017  Component Date Value Ref Range Status  . Fructosamine 09/05/2017 332* 0 - 285 umol/L Final   Comment: Published reference interval for apparently healthy subjects between  age 32 and 53 is 23 - 285 umol/L and in a poorly controlled diabetic population is 228 - 563 umol/L with a mean of 396 umol/L.   Marland Kitchen Glucose, Bld 09/05/2017 161* 70 - 99 mg/dL Final    Allergies as of 09/08/2017      Reactions   Cherry Rash   Lemon Oil Rash      Medication List        Accurate as of 09/08/17  8:28 AM. Always use your most recent med list.          FREESTYLE LIBRE READER Devi 1 Device by Does not apply route as directed.   FREESTYLE LIBRE SENSOR SYSTEM Misc APPLY TO UPPER ARM AND CHANGE SENSOR EVERY 10 DAYS.Marland Kitchen   insulin lispro 100 UNIT/ML KiwkPen Commonly known as:  HUMALOG KWIKPEN Inject 5 units three times a day with meals.   Insulin Pen Needle 32G X 5 MM Misc Commonly known as:  NOVOTWIST Use two per day to inject insulin   Insulin Pen Needle 32G X 4 MM Misc Commonly known as:  NOVOFINE PLUS Use 3 per day to inject insulin   Insulin Pen Needle 31G X 8 MM Misc Commonly known as:  B-D ULTRAFINE III SHORT PEN USE 3 NEEDLES PER DAY   lisinopril-hydrochlorothiazide 10-12.5 MG tablet Commonly known as:  PRINZIDE,ZESTORETIC Take 1 tablet by mouth daily. Takes 1 tablet Monday, Wednesday and Friday   metFORMIN 750 MG 24 hr tablet Commonly known as:  GLUCOPHAGE-XR TAKE 3 TABLETS (2,250 MG TOTAL) DAILY   omeprazole 20 MG capsule Commonly known as:  PRILOSEC Take 1 capsule (20 mg total) by mouth daily.   ONE TOUCH DELICA LANCING DEV Misc Use to check blood sugar 2 times per day   ONE TOUCH ULTRA TEST test strip Generic drug:  glucose blood USE AS INSTRUCTED TO CHECK BLOOD SUGAR TWICE A DAY   ONETOUCH DELICA LANCETS 65H Misc USE TO CHECK BLOOD SUGARS TWICE A DAY   potassium chloride SA 20 MEQ tablet Commonly known as:  K-DUR,KLOR-CON TAKE 1 TABLET DAILY   prochlorperazine 10 MG tablet Commonly known as:  COMPAZINE Take 1 tablet (10 mg total) by mouth every 6 (six) hours as needed for nausea or vomiting.   rosuvastatin 20 MG tablet Commonly  known as:  CRESTOR TAKE 1 TABLET DAILY   Semaglutide 0.25 or 0.5 MG/DOSE Sopn Commonly known as:  OZEMPIC Inject 0.5 mg into the skin once a week.   TRESIBA FLEXTOUCH 100 UNIT/ML  Sopn FlexTouch Pen Generic drug:  insulin degludec INJECT 40 UNITS UNDER THE SKIN DAILY   Vitamin D3 5000 units Caps Take by mouth.       Allergies:  Allergies  Allergen Reactions  . Cherry Rash  . Lemon Oil Rash    Past Medical History:  Diagnosis Date  . ASCUS (atypical squamous cells of undetermined significance) on Pap smear 08/06/1999  . Breast mass in female 2002   Left  . Diabetes mellitus without complication ([place])   . Fibroid uterus 2010  . Hypertension   . Irregular bleeding 2011  . 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 Yeast vaginitis 2006    Past Surgical History:  Procedure Laterality Date  . DILATATION & CURETTAGE/HYSTEROSCOPY WITH TRUECLEAR N/A 01/20/2014   Procedure: DILATATION & CURETTAGE/HYSTEROSCOPY WITH TRUCLEAR;  Surgeon: [name], MD;  Location: [facility];  Service: Gynecology;  Laterality: N/A;  . DILATATION & CURRETTAGE/HYSTEROSCOPY WITH RESECTOCOPE N/A 01/20/2014   Procedure: [place];  Surgeon: [name], MD;  Location: [facility];  Service: Gynecology;  Laterality: N/A;  . NO PAST SURGERIES      Family History  Problem Relation Age of Onset  . Diabetes Mother   . Diabetes Father   . Mental retardation Sister     Social History:  reports that she has never smoked. She has never used smokeless tobacco. She reports that she drinks alcohol. She reports that she does not use drugs.  REVIEW of systems:  She is being followed by her PCP for hypertension with Zestoretic 20 mg  Has had hypercholesterolemia, taking Crestor 20 mg with good control, has history of mild increase in triglycerides Recent levels are better   Lab Results  Component Value Date   CHOL 155 07/08/2017   HDL 48.20  07/08/2017   LDLCALC 86 07/08/2017   LDLDIRECT 97.7 02/11/2014   TRIG 105.0 07/08/2017   CHOLHDL 3 07/08/2017      Examination:   BP 138/76 (BP Location: Left Arm, Patient Position: Sitting, Cuff Size: Normal)   Pulse 86   Ht 5\' 8"  (1.727 m)   Wt 247 lb (112 kg)   SpO2 98%   BMI 37.56 kg/m   Body mass index is 37.56 kg/m.    Assessment/Plan:   Diabetes type 2, uncontrolled  See history of present illness for detailed discussion of her current management, blood sugar patterns and problems identified  She still has difficulty getting control of postprandial readings Although she is taking Ozempic and her average blood sugars are better the fructosamine of 332 indicates overall hyperglycemia that is not controlled  She has frequent readings over 200 and up to even 300 after lunch and sometimes supper This is all dependent on her choice of foods and portions and occasionally may have better readings also  Explained to the patient that she does have insulin deficiency and depending on her diet she may need mealtime coverage also so that her postprandial readings are not averaging over 200 and preferably under 180  Blood sugars and day-to-day management were reviewed with patient with the download of the freestyle Libre sensor  Last A1c was 9.3  Recommendations: She will take Humalog 10-12 units with her lunch unless she is eating a small low-fat low carbohydrate meal May also need to take it at dinnertime if eating larger meals Discussed that it is okay to have only 2 meals a day and does not have to have meals within a  certain interval of time Regular exercise Continue Ozempic Consistent dose of Tresiba every morning Check A1c on the next visit    There are no Patient Instructions on file for this visit.     Elayne Snare 09/08/2017, 8:28 AM   Note: This office note was prepared with Dragon voice recognition system technology. Any transcriptional errors that result  from this process are unintentional.

## 2017-09-08 ENCOUNTER — Encounter: Payer: Self-pay | Admitting: Endocrinology

## 2017-09-08 ENCOUNTER — Ambulatory Visit: Payer: BLUE CROSS/BLUE SHIELD | Admitting: Endocrinology

## 2017-09-08 VITALS — BP 138/76 | HR 86 | Ht 68.0 in | Wt 247.0 lb

## 2017-09-08 DIAGNOSIS — E1165 Type 2 diabetes mellitus with hyperglycemia: Secondary | ICD-10-CM

## 2017-09-08 DIAGNOSIS — Z794 Long term (current) use of insulin: Secondary | ICD-10-CM

## 2017-09-08 NOTE — Patient Instructions (Signed)
Humalog 10 units before lunch and keep sugars <180 after meals  INDOOR EXERCISE IDEAS   Use the following examples for a creative indoor workout (perform each move for 2-3 minutes):   Warm up. Put on some music that makes you feel like moving, and dance around the living room.  Watch exercise shows on TV and move along with them. There are tons of free cable channels that have daily exercise shows on them for all levels - beginner through advanced.   You can easily find a number of exercise videos but use one that will suit your liking and exercise level; you can do these on your own schedule.  Walk up and down the steps.  Do dumbbell curls and presses (if you don't have weights, use full water bottles).  Do assisted squats, keeping your back on a fitness ball against the wall or using the back of the couch for support.  Shadow box: Lift and lower the left leg; jab with the right arm, then the left; then lift and lower the right leg.  Fence (you don't even need swords). Pretend you're holding a sword in each hand. Create an X pattern standing still, then moving forward and back.  Hop on your exercise bike or treadmill -- or, for something different, use a weighted hula hoop. If you don't have any of those, just go back to dancing.  Do abdominal crunches (hold a weighted ball for added resistance).  Cool down with [employer] "I Feel Good" -- or whatever tune makes you feel good

## 2017-09-10 DIAGNOSIS — Z713 Dietary counseling and surveillance: Secondary | ICD-10-CM | POA: Diagnosis not present

## 2017-09-10 DIAGNOSIS — E669 Obesity, unspecified: Secondary | ICD-10-CM | POA: Diagnosis not present

## 2017-09-23 DIAGNOSIS — Z794 Long term (current) use of insulin: Secondary | ICD-10-CM | POA: Diagnosis not present

## 2017-09-23 DIAGNOSIS — E669 Obesity, unspecified: Secondary | ICD-10-CM | POA: Diagnosis not present

## 2017-09-23 DIAGNOSIS — Z6836 Body mass index (BMI) 36.0-36.9, adult: Secondary | ICD-10-CM | POA: Diagnosis not present

## 2017-09-23 DIAGNOSIS — E119 Type 2 diabetes mellitus without complications: Secondary | ICD-10-CM | POA: Diagnosis not present

## 2017-10-18 ENCOUNTER — Other Ambulatory Visit: Payer: Self-pay | Admitting: Endocrinology

## 2017-10-28 ENCOUNTER — Encounter: Payer: Self-pay | Admitting: Endocrinology

## 2017-10-28 ENCOUNTER — Other Ambulatory Visit: Payer: Self-pay

## 2017-10-28 MED ORDER — SEMAGLUTIDE(0.25 OR 0.5MG/DOS) 2 MG/1.5ML ~~LOC~~ SOPN: 0.5000 mg | PEN_INJECTOR | SUBCUTANEOUS | 3 refills | Status: DC

## 2017-10-28 MED ORDER — SEMAGLUTIDE(0.25 OR 0.5MG/DOS) 2 MG/1.5ML ~~LOC~~ SOPN: 0.5000 mg | PEN_INJECTOR | SUBCUTANEOUS | 2 refills | Status: DC

## 2017-10-28 MED ORDER — GLUCOSE BLOOD VI STRP: ORAL_STRIP | 2 refills | Status: DC

## 2017-10-28 MED ORDER — ONETOUCH DELICA LANCETS 33G MISC: 2 refills | Status: DC

## 2017-10-29 ENCOUNTER — Other Ambulatory Visit: Payer: Self-pay

## 2017-10-29 MED ORDER — INSULIN DEGLUDEC 100 UNIT/ML ~~LOC~~ SOPN
PEN_INJECTOR | SUBCUTANEOUS | 2 refills | Status: DC
Start: 2017-10-29 — End: 2018-01-16

## 2017-12-03 ENCOUNTER — Other Ambulatory Visit: Payer: Self-pay

## 2017-12-03 ENCOUNTER — Other Ambulatory Visit: Payer: BLUE CROSS/BLUE SHIELD

## 2017-12-03 MED ORDER — FREESTYLE LIBRE 14 DAY READER DEVI: 1.0000 | Freq: Every day | 0 refills | Status: DC

## 2017-12-03 MED ORDER — FREESTYLE LIBRE 14 DAY SENSOR MISC: 1.0000 | Freq: Every day | 3 refills | Status: DC

## 2017-12-08 ENCOUNTER — Ambulatory Visit: Payer: BLUE CROSS/BLUE SHIELD | Admitting: Endocrinology

## 2017-12-12 ENCOUNTER — Other Ambulatory Visit: Payer: Self-pay | Admitting: Endocrinology

## 2017-12-25 ENCOUNTER — Other Ambulatory Visit: Payer: Self-pay | Admitting: Endocrinology

## 2018-01-05 DIAGNOSIS — Z1389 Encounter for screening for other disorder: Secondary | ICD-10-CM | POA: Diagnosis not present

## 2018-01-05 DIAGNOSIS — E1169 Type 2 diabetes mellitus with other specified complication: Secondary | ICD-10-CM | POA: Diagnosis not present

## 2018-01-05 DIAGNOSIS — E785 Hyperlipidemia, unspecified: Secondary | ICD-10-CM | POA: Diagnosis not present

## 2018-01-05 DIAGNOSIS — Z6837 Body mass index (BMI) 37.0-37.9, adult: Secondary | ICD-10-CM | POA: Diagnosis not present

## 2018-01-05 DIAGNOSIS — I10 Essential (primary) hypertension: Secondary | ICD-10-CM | POA: Diagnosis not present

## 2018-01-14 ENCOUNTER — Other Ambulatory Visit: Payer: Self-pay | Admitting: Endocrinology

## 2018-01-16 ENCOUNTER — Other Ambulatory Visit: Payer: Self-pay

## 2018-01-16 MED ORDER — INSULIN DEGLUDEC 100 UNIT/ML ~~LOC~~ SOPN: PEN_INJECTOR | SUBCUTANEOUS | 1 refills | Status: DC

## 2018-01-20 DIAGNOSIS — E669 Obesity, unspecified: Secondary | ICD-10-CM | POA: Diagnosis not present

## 2018-01-20 DIAGNOSIS — I10 Essential (primary) hypertension: Secondary | ICD-10-CM | POA: Diagnosis not present

## 2018-01-20 DIAGNOSIS — Z6836 Body mass index (BMI) 36.0-36.9, adult: Secondary | ICD-10-CM | POA: Diagnosis not present

## 2018-01-20 DIAGNOSIS — E119 Type 2 diabetes mellitus without complications: Secondary | ICD-10-CM | POA: Diagnosis not present

## 2018-01-23 ENCOUNTER — Other Ambulatory Visit (INDEPENDENT_AMBULATORY_CARE_PROVIDER_SITE_OTHER): Payer: BLUE CROSS/BLUE SHIELD

## 2018-01-23 DIAGNOSIS — E1165 Type 2 diabetes mellitus with hyperglycemia: Secondary | ICD-10-CM | POA: Diagnosis not present

## 2018-01-23 DIAGNOSIS — Z794 Long term (current) use of insulin: Secondary | ICD-10-CM

## 2018-01-23 LAB — HEMOGLOBIN A1C: HEMOGLOBIN A1C: 8.6 % — AB (ref 4.6–6.5)

## 2018-01-23 LAB — GLUCOSE, RANDOM: GLUCOSE: 152 mg/dL — AB (ref 70–99)

## 2018-01-26 NOTE — Progress Notes (Signed)
Patient ID: Alyssa Ross, female   DOB: Nov 04, 1965, 52 y.o.   MRN: [id number]   Reason for Appointment: Diabetes follow-up   History of Present Illness   Diagnosis: Type 2 DIABETES MELITUS, date of diagnosis 2005   She had previously been on metformin and Amaryl Because of inadequate control she was given Victoza in addition in 2011 With this her blood sugars have been significantly better and her A1c has been as low as 6.2 in 2013 Her blood sugars have been more difficult to control since 2014 an A1c consistently over 7% She  was started on insulin with Levemir in 06/2013 because of  increase in her A1c to 8.5% She had previously been taking Victoza along with Amaryl and metformin without consistent control Also had difficulty losing weight despite taking 1.8 mg Victoza, also had tried the 3 mg dose Because of episodic nausea and diarrhea in 1/16 she went off her Victoza  RECENT history:   INSULIN dose: TRESIBA 44 units pm.  Humalog 10-12 units  Non-insulin hypoglycemic drugs:  metformin 2250 mg, Ozempic currently not taking      A1c still high at 8.6, previously 9.3, was down to 7.7 in 7/18  She has been on Ozempic since 07/2017  Current management, blood sugars and problems identified as follows  The last 2 weeks blood sugars have been averaging 204 compared to 176 previously  She thinks that she started having nausea, diarrhea and stopped her Ozempic 3 weeks ago  Although blood sugars were still not controlled on Ozempic they are higher now  Most of her high readings are postprandial with some readings well over 300  Highest readings on average are before bedtime averaging 266  Also now she thinks that her blood sugars go up even with the smallest amount of carbohydrates such as crackers on salad  Sometimes she will have high readings persistent throughout the night but recently fasting readings have been as low as 120  On the last visit was told to take HUMALOG  consistently with all meals that have significant amount of carbohydrate including at lunch but she only takes it about once a day  Surprisingly has lost weight  FASTING blood sugars are averaging about 140 but only the last 3 days have been as low as 120  Despite reminders she does not exercise regularly, she again says she does not have time to do this     Side effects from medications: None   Monitors blood glucose: With freestyle Libre sensor  Blood Glucose data and averages by time of day from review of download today   CONTINUOUS GLUCOSE MONITORING RECORD analysis: Results statistics:   CGM use % of time  94  Average and SD  204+/-61  Time in range      40 %  % Time Above 180  60  % Time above 250  23  % Time Below target  0     PRE-MEAL Fasting Lunch Dinner Bedtime Overall  Glucose range:       Mean/median:  142  187  239   204   POST-MEAL PC Breakfast PC Lunch PC Dinner  Glucose range:     Mean/median:  203  237  266   PREVIOUS readings:  PRE-MEAL Fasting Lunch Dinner Bedtime Overall  Glucose range:       Mean/median:  143  174  213   176   POST-MEAL PC Breakfast PC Lunch PC Dinner  Glucose range:  Mean/median:   232  211      Meals: 3 meals per day. Dinner 8-9 pm    DIET: As above, Not consistently watching high-fat foods, eating some Sweets  Physical activity: exercise:  Now not going to the gym  And does not find time to exercise at home            Wt Readings from Last 3 Encounters:  01/27/18 241 lb (109.3 kg)  09/08/17 247 lb (112 kg)  07/11/17 246 lb 6.4 oz (111.8 kg)    LABS:  Lab Results  Component Value Date   HGBA1C 8.6 (H) 01/23/2018   HGBA1C 9.3 (H) 07/08/2017   HGBA1C 7.9 (H) 04/08/2017   Lab Results  Component Value Date   MICROALBUR 1.5 01/06/2017   LDLCALC 86 07/08/2017   CREATININE 0.93 07/08/2017    Lab on 01/23/2018  Component Date Value Ref Range Status  . Glucose, Bld 01/23/2018 152* 70 - 99 mg/dL Final  . Hgb  A1c MFr Bld 01/23/2018 8.6* 4.6 - 6.5 % Final   Glycemic Control Guidelines for People with Diabetes:Non Diabetic:  <6%Goal of Therapy: <7%Additional Action Suggested:  >8%     Allergies as of 01/27/2018      Reactions   Cherry Rash   Lemon Oil Rash      Medication List        Accurate as of 01/27/18  9:45 AM. Always use your most recent med list.          FREESTYLE LIBRE 14 DAY READER Devi USE READER TO CHECK BLOOD GLUCOSE WITH FREESTYLE LIBRE SENSORS.   FREESTYLE LIBRE 14 DAY SENSOR Misc 1 each by Does not apply route daily. Apply sensor to body once every 14 days to monitor blood sugar.   glucose blood test strip USE AS INSTRUCTED TO CHECK BLOOD SUGAR TWICE A DAY   insulin degludec 100 UNIT/ML Sopn FlexTouch Pen Commonly known as:  TRESIBA INJECT 44 UNITS UNDER THE SKIN DAILY   insulin lispro 100 UNIT/ML KiwkPen Commonly known as:  HUMALOG Inject 5 units three times a day with meals.   Insulin Pen Needle 32G X 5 MM Misc Use two per day to inject insulin   Insulin Pen Needle 32G X 4 MM Misc Use 3 per day to inject insulin   Insulin Pen Needle 31G X 8 MM Misc USE 3 NEEDLES PER DAY AS DIRECTED   lisinopril-hydrochlorothiazide 10-12.5 MG tablet Commonly known as:  PRINZIDE,ZESTORETIC Take 1 tablet by mouth daily. Takes 1 tablet Monday, Wednesday and Friday   metFORMIN 750 MG 24 hr tablet Commonly known as:  GLUCOPHAGE-XR TAKE 3 TABLETS (2,250 MG TOTAL) DAILY   omeprazole 20 MG capsule Commonly known as:  PRILOSEC Take 1 capsule (20 mg total) by mouth daily.   ONE TOUCH DELICA LANCING DEV Misc Use to check blood sugar 2 times per day   ONETOUCH DELICA LANCETS 62X Misc USE TO CHECK BLOOD SUGARS TWICE A DAY   potassium chloride SA 20 MEQ tablet Commonly known as:  K-DUR,KLOR-CON TAKE 1 TABLET DAILY   rosuvastatin 20 MG tablet Commonly known as:  CRESTOR TAKE 1 TABLET DAILY   Semaglutide 0.25 or 0.5 MG/DOSE Sopn Inject 0.5 mg into the skin once a  week.   Vitamin D3 5000 units Caps Take by mouth.       Allergies:  Allergies  Allergen Reactions  . Cherry Rash  . Lemon Oil Rash    Past Medical History:  Diagnosis Date  .  ASCUS (atypical squamous cells of undetermined significance) on Pap smear 08/06/1999  . Breast mass in female 2002   Left  . Diabetes mellitus without complication ([place])   . Fibroid uterus 2010  . Hypertension   . Irregular bleeding 2011  . 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 Yeast vaginitis 2006    Past Surgical History:  Procedure Laterality Date  . DILATATION & CURETTAGE/HYSTEROSCOPY WITH TRUECLEAR N/A 01/20/2014   Procedure: DILATATION & CURETTAGE/HYSTEROSCOPY WITH TRUCLEAR;  Surgeon: [name], MD;  Location: [facility];  Service: Gynecology;  Laterality: N/A;  . DILATATION & CURRETTAGE/HYSTEROSCOPY WITH RESECTOCOPE N/A 01/20/2014   Procedure: [place];  Surgeon: [name], MD;  Location: [facility];  Service: Gynecology;  Laterality: N/A;  . NO PAST SURGERIES      Family History  Problem Relation Age of Onset  . Diabetes Mother   . Diabetes Father   . Mental retardation Sister     Social History:  reports that she has never smoked. She has never used smokeless tobacco. She reports that she drinks alcohol. She reports that she does not use drugs.  REVIEW of systems:  She is being followed by her PCP for hypertension with Zestoretic 20 mg She thinks her blood pressure routinely with PCP was better  BP Readings from Last 3 Encounters:  01/27/18 122/88  09/08/17 138/76  07/11/17 127/88     Has had hypercholesterolemia, taking Crestor 20 mg with good control, has history of mild increase in triglycerides Recent levels are available from July and cholesterol was 145 with LDL 78 followed by PCP   Lab Results  Component Value Date   CHOL 155 07/08/2017   HDL 48.20 07/08/2017   LDLCALC 86 07/08/2017    LDLDIRECT 97.7 02/11/2014   TRIG 105.0 07/08/2017   CHOLHDL 3 07/08/2017      Examination:   BP 122/88 (BP Location: Right Arm, Patient Position: Sitting, Cuff Size: Normal)   Pulse 90   Ht 5' 8.5" (1.74 m)   Wt 241 lb (109.3 kg)   SpO2 97%   BMI 36.11 kg/m    Body mass index is 36.11 kg/m.    Assessment/Plan:   Diabetes type 2, uncontrolled  See history of present illness for detailed discussion of her current management, blood sugar patterns and problems identified  Her A1c is still high at 8.6  Currently on basal bolus insulin, metformin and previously on Ozempic  Blood sugars are higher with her stopping Ozempic 3 weeks ago because of GI side effects However she continues to have high postprandial readings and does not take enough Humalog for this and also not with every meal Fasting readings are relatively good with only 44 units of Tresiba Most recent average blood sugar is 204 over the last 2 weeks from her freestyle [employer]  As before she can do better with diet and exercise regimen even though she has lost weight since her last visit  Recommendations: She was given options for weight loss medications including generic Topamax and phentermine low-dose Also discussed benefits and rolled off SGLT2 drugs especially with maintaining better blood sugars, long-term benefits on cardiovascular risk and improvement in weight Given patient information brochures on Invokana which is covered by her insurance She continues to refuse any new interventions  Discussed that she will need to take Humalog with every meal especially on lunch and supper She can try Ozempic 0.25 mg for the first 6 weeks and then  0.5 More regular exercise Take enough Humalog to keep blood sugars under 180 after meals  HYPERTENSION: Continue to follow-up with PCP  Counseling time on subjects discussed in assessment and plan sections is over 50% of today's 25 minute visit   Patient  Instructions  ozempic 0.25mg  weekly for 6 weeks      Elayne Snare 01/27/2018, 9:45 AM   Note: This office note was prepared with Dragon voice recognition system technology. Any transcriptional errors that result from this process are unintentional.

## 2018-01-27 ENCOUNTER — Encounter: Payer: Self-pay | Admitting: Endocrinology

## 2018-01-27 ENCOUNTER — Ambulatory Visit: Payer: BLUE CROSS/BLUE SHIELD | Admitting: Endocrinology

## 2018-01-27 VITALS — BP 122/88 | HR 90 | Ht 68.5 in | Wt 241.0 lb

## 2018-01-27 DIAGNOSIS — E1165 Type 2 diabetes mellitus with hyperglycemia: Secondary | ICD-10-CM | POA: Diagnosis not present

## 2018-01-27 DIAGNOSIS — Z794 Long term (current) use of insulin: Secondary | ICD-10-CM | POA: Diagnosis not present

## 2018-01-27 LAB — MICROALBUMIN / CREATININE URINE RATIO
Creatinine,U: 81.1 mg/dL
MICROALB UR: 1.8 mg/dL (ref 0.0–1.9)
Microalb Creat Ratio: 2.2 mg/g (ref 0.0–30.0)

## 2018-01-27 NOTE — Patient Instructions (Signed)
ozempic 0.25mg  weekly for 6 weeks

## 2018-02-08 ENCOUNTER — Other Ambulatory Visit: Payer: Self-pay | Admitting: Endocrinology

## 2018-02-17 ENCOUNTER — Other Ambulatory Visit: Payer: Self-pay | Admitting: Endocrinology

## 2018-03-20 ENCOUNTER — Other Ambulatory Visit (INDEPENDENT_AMBULATORY_CARE_PROVIDER_SITE_OTHER): Payer: BLUE CROSS/BLUE SHIELD

## 2018-03-20 DIAGNOSIS — E1165 Type 2 diabetes mellitus with hyperglycemia: Secondary | ICD-10-CM | POA: Diagnosis not present

## 2018-03-20 DIAGNOSIS — Z794 Long term (current) use of insulin: Secondary | ICD-10-CM

## 2018-03-20 LAB — BASIC METABOLIC PANEL
BUN: 20 mg/dL (ref 6–23)
CHLORIDE: 104 meq/L (ref 96–112)
CO2: 30 meq/L (ref 19–32)
CREATININE: 1.03 mg/dL (ref 0.40–1.20)
Calcium: 9.3 mg/dL (ref 8.4–10.5)
GFR: 72.35 mL/min (ref >60.00)
Glucose, Bld: 100 mg/dL — ABNORMAL HIGH (ref 70–99)
Potassium: 3.7 mEq/L (ref 3.5–5.1)
Sodium: 142 mEq/L (ref 135–145)

## 2018-03-20 LAB — URINALYSIS, ROUTINE W REFLEX MICROSCOPIC
HGB URINE DIPSTICK: NEGATIVE
KETONES UR: NEGATIVE
Leukocytes, UA: NEGATIVE
Nitrite: NEGATIVE
Specific Gravity, Urine: >=1.03 — AB (ref 1.000–1.030)
URINE GLUCOSE: NEGATIVE
UROBILINOGEN UA: 1 (ref 0.0–1.0)
pH: 6 (ref 5.0–8.0)

## 2018-03-21 LAB — FRUCTOSAMINE: FRUCTOSAMINE: 320 umol/L — AB (ref 0–285)

## 2018-03-24 ENCOUNTER — Encounter: Payer: Self-pay | Admitting: Endocrinology

## 2018-03-24 ENCOUNTER — Ambulatory Visit: Payer: BLUE CROSS/BLUE SHIELD | Admitting: Endocrinology

## 2018-03-24 VITALS — BP 128/86 | HR 88 | Ht 69.0 in | Wt 243.0 lb

## 2018-03-24 DIAGNOSIS — Z23 Encounter for immunization: Secondary | ICD-10-CM | POA: Diagnosis not present

## 2018-03-24 DIAGNOSIS — Z794 Long term (current) use of insulin: Secondary | ICD-10-CM

## 2018-03-24 DIAGNOSIS — E1165 Type 2 diabetes mellitus with hyperglycemia: Secondary | ICD-10-CM

## 2018-03-24 MED ORDER — INSULIN DEGLUDEC 100 UNIT/ML ~~LOC~~ SOPN: PEN_INJECTOR | SUBCUTANEOUS | 3 refills | Status: DC

## 2018-03-24 NOTE — Progress Notes (Signed)
Patient ID: Alyssa Ross, female   DOB: 06-13-1965, 52 y.o.   MRN: [id number]   Reason for Appointment: Diabetes follow-up   History of Present Illness   Diagnosis: Type 2 DIABETES MELITUS, date of diagnosis 2005   Alyssa Ross had previously been on metformin and Amaryl Because of inadequate control Alyssa Ross was given Victoza in addition in 2011 With this her blood sugars have been significantly better and her A1c has been as low as 6.2 in 2013 Her blood sugars have been more difficult to control since 2014 an A1c consistently over 7% Alyssa Ross  was started on insulin with Levemir in 06/2013 because of  increase in her A1c to 8.5% Alyssa Ross had previously been taking Victoza along with Amaryl and metformin without consistent control Also had difficulty losing weight despite taking 1.8 mg Victoza, also had tried the 3 mg dose Because of episodic nausea and diarrhea in 1/16 Alyssa Ross went off her Victoza  RECENT history:   INSULIN dose: TRESIBA 44 units pm.  Humalog 0-12 units  Non-insulin hypoglycemic drugs:  metformin 2250 mg, Ozempic 0.25 mg weekly      A1c in August was still high at 8.6, previously 9.3 Fructosamine is 320  Alyssa Ross has been on Ozempic since 07/2017 although this was interrupted prior to her last visit  Current management, blood sugars and problems identified as follows  Prior to her last visit her blood sugars were averaging 204  Now with resuming Ozempic 0.25 mg weekly blood sugars are overall improved with an average of 174  However Alyssa Ross still has significant postprandial hyperglycemia  Blood sugars are averaging 200 or more between 3 PM and 2 AM mostly consistently  Alyssa Ross refuses to take Humalog because Alyssa Ross thinks it makes her gain weight and increases of fat in her abdomen  Alyssa Ross is afraid of increasing her Ozempic beyond 0.25 because Alyssa Ross had nausea with the higher dose previously  Also still has difficulty watching her diet  Despite using Ozempic her weight is not any  better  However Alyssa Ross is exercising  FASTING blood sugars are reasonably good and averaging in the 130s     Side effects from medications: None   Monitors blood glucose: With freestyle Libre sensor  Blood Glucose data and averages by time of day from review of download today   CONTINUOUS GLUCOSE MONITORING RECORD analysis:  CGM use % of time  84  Average and SD  174+/-51  Time in range  57      %  % Time Above 180  43  % Time above 250  9  % Time Below target  0     PRE-MEAL Fasting Lunch Dinner Bedtime Overall  Glucose range:       Mean/median:  135  145  201  246  174   POST-MEAL PC Breakfast PC Lunch PC Dinner  Glucose range:     Mean/median:   197  230   Previous readings:   PRE-MEAL Fasting Lunch Dinner Bedtime Overall  Glucose range:       Mean/median:  142  187  239   204   POST-MEAL PC Breakfast PC Lunch PC Dinner  Glucose range:     Mean/median:  203  237  266    Meals: 3 meals per day. Dinner 8-9 pm     DIET: As above, Not consistently watching high-fat foods, eating Sweets  Physical activity: exercise:  3-4/7 going to the gym recently  Wt Readings from Last 3 Encounters:  03/24/18 243 lb (110.2 kg)  01/27/18 241 lb (109.3 kg)  09/08/17 247 lb (112 kg)    LABS:  Lab Results  Component Value Date   HGBA1C 8.6 (H) 01/23/2018   HGBA1C 9.3 (H) 07/08/2017   HGBA1C 7.9 (H) 04/08/2017   Lab Results  Component Value Date   MICROALBUR 1.8 01/27/2018   LDLCALC 86 07/08/2017   CREATININE 1.03 03/20/2018    Lab on 03/20/2018  Component Date Value Ref Range Status  . Fructosamine 03/20/2018 320* 0 - 285 umol/L Final   Comment: Published reference interval for apparently healthy subjects between age 21 and 39 is 91 - 285 umol/L and in a poorly controlled diabetic population is 228 - 563 umol/L with a mean of 396 umol/L.   Marland Kitchen Sodium 03/20/2018 142  135 - 145 mEq/L Final  . Potassium 03/20/2018 3.7  3.5 - 5.1 mEq/L Final  . Chloride  03/20/2018 104  96 - 112 mEq/L Final  . CO2 03/20/2018 30  19 - 32 mEq/L Final  . Glucose, Bld 03/20/2018 100* 70 - 99 mg/dL Final  . BUN 03/20/2018 20  6 - 23 mg/dL Final  . Creatinine, Ser 03/20/2018 1.03  0.40 - 1.20 mg/dL Final  . Calcium 03/20/2018 9.3  8.4 - 10.5 mg/dL Final  . GFR 03/20/2018 72.35  >60.00 mL/min Final  . Color, Urine 03/20/2018 YELLOW  Yellow;Lt. Yellow Final  . APPearance 03/20/2018 Clear  Clear Final  . Specific Gravity, Urine 03/20/2018 >=1.030* 1.000 - 1.030 Final  . pH 03/20/2018 6.0  5.0 - 8.0 Final  . Total Protein, Urine 03/20/2018 TRACE* Negative Final  . Urine Glucose 03/20/2018 NEGATIVE  Negative Final  . Ketones, ur 03/20/2018 NEGATIVE  Negative Final  . Bilirubin Urine 03/20/2018 SMALL* Negative Final  . Hgb urine dipstick 03/20/2018 NEGATIVE  Negative Final  . Urobilinogen, UA 03/20/2018 1.0  0.0 - 1.0 Final  . Leukocytes, UA 03/20/2018 NEGATIVE  Negative Final  . Nitrite 03/20/2018 NEGATIVE  Negative Final  . WBC, UA 03/20/2018 0-2/hpf  0-2/hpf Final  . RBC / HPF 03/20/2018 0-2/hpf  0-2/hpf Final  . Mucus, UA 03/20/2018 Presence of* None Final  . Squamous Epithelial / LPF 03/20/2018 Rare(0-4/hpf)  Rare(0-4/hpf) Final  . Hyaline Casts, UA 03/20/2018 Presence of* None Final    Allergies as of 03/24/2018      Reactions   Cherry Rash   Lemon Oil Rash      Medication List        Accurate as of 03/24/18  8:29 PM. Always use your most recent med list.          FREESTYLE LIBRE 14 DAY READER Devi USE READER TO CHECK BLOOD GLUCOSE WITH FREESTYLE LIBRE SENSORS.   FREESTYLE LIBRE 14 DAY SENSOR Misc 1 each by Does not apply route daily. Apply sensor to body once every 14 days to monitor blood sugar.   glucose blood test strip USE AS INSTRUCTED TO CHECK BLOOD SUGAR TWICE A DAY   insulin degludec 100 UNIT/ML Sopn FlexTouch Pen Commonly known as:  TRESIBA INJECT 44 UNITS UNDER THE SKIN DAILY   insulin lispro 100 UNIT/ML KiwkPen Commonly  known as:  HUMALOG Inject 5 units three times a day with meals.   Insulin Pen Needle 32G X 5 MM Misc Use two per day to inject insulin   Insulin Pen Needle 32G X 4 MM Misc Use 3 per day to inject insulin   Insulin Pen Needle 31G  X 8 MM Misc USE 3 NEEDLES PER DAY AS DIRECTED   lisinopril-hydrochlorothiazide 10-12.5 MG tablet Commonly known as:  PRINZIDE,ZESTORETIC Take 1 tablet by mouth daily. Takes 1 tablet Monday, Wednesday and Friday   metFORMIN 750 MG 24 hr tablet Commonly known as:  GLUCOPHAGE-XR TAKE 3 TABLETS (2,250 MG TOTAL) DAILY   metFORMIN 750 MG 24 hr tablet Commonly known as:  GLUCOPHAGE-XR TAKE 3 TABLETS DAILY   omeprazole 20 MG capsule Commonly known as:  PRILOSEC Take 1 capsule (20 mg total) by mouth daily.   ONE TOUCH DELICA LANCING DEV Misc Use to check blood sugar 2 times per day   ONETOUCH DELICA LANCETS 29J Misc USE TO CHECK BLOOD SUGARS TWICE A DAY   potassium chloride SA 20 MEQ tablet Commonly known as:  K-DUR,KLOR-CON TAKE 1 TABLET DAILY   rosuvastatin 20 MG tablet Commonly known as:  CRESTOR TAKE 1 TABLET DAILY   Semaglutide(0.25 or 0.5MG /DOS) 2 MG/1.5ML Sopn Inject 0.5 mg into the skin once a week.   Vitamin D3 5000 units Caps Take by mouth.       Allergies:  Allergies  Allergen Reactions  . Cherry Rash  . Lemon Oil Rash    Past Medical History:  Diagnosis Date  . ASCUS (atypical squamous cells of undetermined significance) on Pap smear 08/06/1999  . Breast mass in female 2002   Left  . Diabetes mellitus without complication ([place])   . Fibroid uterus 2010  . Hypertension   . Irregular bleeding 2011  . 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 Yeast vaginitis 2006    Past Surgical History:  Procedure Laterality Date  . DILATATION & CURETTAGE/HYSTEROSCOPY WITH TRUECLEAR N/A 01/20/2014   Procedure: DILATATION & CURETTAGE/HYSTEROSCOPY WITH TRUCLEAR;  Surgeon: [name], MD;  Location: [facility];   Service: Gynecology;  Laterality: N/A;  . DILATATION & CURRETTAGE/HYSTEROSCOPY WITH RESECTOCOPE N/A 01/20/2014   Procedure: [place];  Surgeon: [name], MD;  Location: [facility];  Service: Gynecology;  Laterality: N/A;  . NO PAST SURGERIES      Family History  Problem Relation Age of Onset  . Diabetes Mother   . Diabetes Father   . Mental retardation Sister     Social History:  reports that Alyssa Ross has never smoked. Alyssa Ross has never used smokeless tobacco. Alyssa Ross reports that Alyssa Ross drinks alcohol. Alyssa Ross reports that Alyssa Ross does not use drugs.  REVIEW of systems:  Alyssa Ross is being followed by her PCP for hypertension with Zestoretic 20 mg    BP Readings from Last 3 Encounters:  03/24/18 128/86  01/27/18 122/88  09/08/17 138/76     Has had hypercholesterolemia, taking Crestor 20 mg with good control, has history of mild increase in triglycerides Recent levels are available from July 2019 and cholesterol was 145 with LDL 78, his followed by PCP   Lab Results  Component Value Date   CHOL 155 07/08/2017   HDL 48.20 07/08/2017   LDLCALC 86 07/08/2017   LDLDIRECT 97.7 02/11/2014   TRIG 105.0 07/08/2017   CHOLHDL 3 07/08/2017      Examination:   BP 128/86   Pulse 88   Ht 5\' 9"  (1.753 m)   Wt 243 lb (110.2 kg)   BMI 35.88 kg/m   Body mass index is 35.88 kg/m.    Assessment/Plan:   Diabetes type 2, uncontrolled  See history of present illness for detailed discussion of her current management, blood sugar patterns and problems identified  Her  A1c is usually over 8%, last 8.6  Currently on basal bolus insulin, metformin and again on Ozempic  Blood sugars are improved with her resuming Ozempic even though Alyssa Ross is taking only 0.25 mg weekly Alyssa Ross is afraid of taking a higher dose Discussed in detail the actions of basal insulin and bolus insulin and different roles of the 2 types of insulin Alyssa Ross does not understand that mealtime insulin is  likely make her weight go up and her weight is stable now only because her blood sugars stay high after meals Discussed that Alyssa Ross is insulin deficient Alyssa Ross also can do much better with diet to help with her weight loss  Since Alyssa Ross is reluctant to take full doses of Humalog with meals discussed that in insulin pump such as Omnipod would be much more helpful with reducing her insulin requirement and more efficient insulin delivery as well as convenience of mealtime insulin Explained to her the principles of insulin pumps Alyssa Ross refuses to consider this  However Alyssa Ross agrees to try adding at least another 5 clicks on the dose of Ozempic beyond the 0.25 dose and gradually working up Alyssa Ross will try to take at least 5 to 6 units of Humalog with at least lunch and dinner more regularly A1c on the next visit  HYPERTENSION: Fairly well controlled without microalbuminuria Continue to follow-up with PCP  Discussed benefits of influenza vaccine and Alyssa Ross agrees to take this  Counseling time on subjects discussed in assessment and plan sections is over 50% of today's 25 minute visit   Patient Instructions  Extra 5 clicks on Ozempic      Livier Hendel 03/24/2018, 8:29 PM   Note: This office note was prepared with Dragon voice recognition system technology. Any transcriptional errors that result from this process are unintentional.

## 2018-03-24 NOTE — Patient Instructions (Signed)
Extra 5 clicks on Ozempic

## 2018-04-15 DIAGNOSIS — Z713 Dietary counseling and surveillance: Secondary | ICD-10-CM | POA: Diagnosis not present

## 2018-04-22 ENCOUNTER — Other Ambulatory Visit: Payer: Self-pay | Admitting: Internal Medicine

## 2018-06-12 ENCOUNTER — Other Ambulatory Visit: Payer: Self-pay | Admitting: Endocrinology

## 2018-06-16 ENCOUNTER — Other Ambulatory Visit (INDEPENDENT_AMBULATORY_CARE_PROVIDER_SITE_OTHER): Payer: BLUE CROSS/BLUE SHIELD

## 2018-06-16 DIAGNOSIS — Z794 Long term (current) use of insulin: Secondary | ICD-10-CM

## 2018-06-16 DIAGNOSIS — E1165 Type 2 diabetes mellitus with hyperglycemia: Secondary | ICD-10-CM

## 2018-06-16 LAB — COMPREHENSIVE METABOLIC PANEL
ALK PHOS: 51 U/L (ref 39–117)
ALT: 14 U/L (ref 0–35)
AST: 15 U/L (ref 0–37)
Albumin: 4.2 g/dL (ref 3.5–5.2)
BILIRUBIN TOTAL: 0.7 mg/dL (ref 0.2–1.2)
BUN: 23 mg/dL (ref 6–23)
CALCIUM: 9.9 mg/dL (ref 8.4–10.5)
CHLORIDE: 100 meq/L (ref 96–112)
CO2: 31 meq/L (ref 19–32)
Creatinine, Ser: 0.96 mg/dL (ref 0.40–1.20)
GFR: 78.4 mL/min (ref >60.00)
GLUCOSE: 132 mg/dL — AB (ref 70–99)
POTASSIUM: 4 meq/L (ref 3.5–5.1)
Sodium: 136 mEq/L (ref 135–145)
Total Protein: 7.3 g/dL (ref 6.0–8.3)

## 2018-06-16 LAB — HEMOGLOBIN A1C: HEMOGLOBIN A1C: 9.2 % — AB (ref 4.6–6.5)

## 2018-06-17 NOTE — Progress Notes (Deleted)
Patient ID: Alyssa Ross, female   DOB: [birth year], 53 y.o.   MRN: [id number]   Reason for Appointment: Diabetes follow-up   History of Present Illness   Diagnosis: Type 2 DIABETES MELITUS, date of diagnosis 2005   She had previously been on metformin and Amaryl Because of inadequate control she was given Victoza in addition in 2011 With this her blood sugars have been significantly better and her A1c has been as low as 6.2 in 2013 Her blood sugars have been more difficult to control since 2014 an A1c consistently over 7% She  was started on insulin with Levemir in 06/2013 because of  increase in her A1c to 8.5% She had previously been taking Victoza along with Amaryl and metformin without consistent control Also had difficulty losing weight despite taking 1.8 mg Victoza, also had tried the 3 mg dose Because of episodic nausea and diarrhea in 1/16 she went off her Victoza  RECENT history:   INSULIN dose: TRESIBA 44 units pm.  Humalog 0-12 units  Non-insulin hypoglycemic drugs:  metformin 2250 mg, Ozempic 0.25 mg weekly      A1c in August was still high at 8.6, previously 9.3 Fructosamine is 320  She has been on Ozempic since 07/2017 although this was interrupted prior to her last visit  Current management, blood sugars and problems identified as follows  Prior to her last visit her blood sugars were averaging 204  Now with resuming Ozempic 0.25 mg weekly blood sugars are overall improved with an average of 174  However she still has significant postprandial hyperglycemia  Blood sugars are averaging 200 or more between 3 PM and 2 AM mostly consistently  She refuses to take Humalog because she thinks it makes her gain weight and increases of fat in her abdomen  She is afraid of increasing her Ozempic beyond 0.25 because she had nausea with the higher dose previously  Also still has difficulty watching her diet  Despite using Ozempic her weight is not any  better  However she is exercising  FASTING blood sugars are reasonably good and averaging in the 130s     Side effects from medications: None   Monitors blood glucose: With freestyle Libre sensor  Blood Glucose data and averages by time of day from review of download today   CONTINUOUS GLUCOSE MONITORING RECORD analysis:  CGM use % of time  84  Average and SD  174+/-51  Time in range  57      %  % Time Above 180  43  % Time above 250  9  % Time Below target  0     PRE-MEAL Fasting Lunch Dinner Bedtime Overall  Glucose range:       Mean/median:  135  145  201  246  174   POST-MEAL PC Breakfast PC Lunch PC Dinner  Glucose range:     Mean/median:   197  230   Previous readings:   PRE-MEAL Fasting Lunch Dinner Bedtime Overall  Glucose range:       Mean/median:  142  187  239   204   POST-MEAL PC Breakfast PC Lunch PC Dinner  Glucose range:     Mean/median:  203  237  266    Meals: 3 meals per day. Dinner 8-9 pm     DIET: As above, Not consistently watching high-fat foods, eating Sweets  Physical activity: exercise:  3-4/7 going to the gym recently  Wt Readings from Last 3 Encounters:  03/24/18 243 lb (110.2 kg)  01/27/18 241 lb (109.3 kg)  09/08/17 247 lb (112 kg)    LABS:  Lab Results  Component Value Date   HGBA1C 9.2 (H) 06/16/2018   HGBA1C 8.6 (H) 01/23/2018   HGBA1C 9.3 (H) 07/08/2017   Lab Results  Component Value Date   MICROALBUR 1.8 01/27/2018   LDLCALC 86 07/08/2017   CREATININE 0.96 06/16/2018    Lab on 06/16/2018  Component Date Value Ref Range Status  . Sodium 06/16/2018 136  135 - 145 mEq/L Final  . Potassium 06/16/2018 4.0  3.5 - 5.1 mEq/L Final  . Chloride 06/16/2018 100  96 - 112 mEq/L Final  . CO2 06/16/2018 31  19 - 32 mEq/L Final  . Glucose, Bld 06/16/2018 132* 70 - 99 mg/dL Final  . BUN 06/16/2018 23  6 - 23 mg/dL Final  . Creatinine, Ser 06/16/2018 0.96  0.40 - 1.20 mg/dL Final  . Total Bilirubin 06/16/2018 0.7   0.2 - 1.2 mg/dL Final  . Alkaline Phosphatase 06/16/2018 51  39 - 117 U/L Final  . AST 06/16/2018 15  0 - 37 U/L Final  . ALT 06/16/2018 14  0 - 35 U/L Final  . Total Protein 06/16/2018 7.3  6.0 - 8.3 g/dL Final  . Albumin 06/16/2018 4.2  3.5 - 5.2 g/dL Final  . Calcium 06/16/2018 9.9  8.4 - 10.5 mg/dL Final  . GFR 06/16/2018 78.40  >60.00 mL/min Final  . Hgb A1c MFr Bld 06/16/2018 9.2* 4.6 - 6.5 % Final   Glycemic Control Guidelines for People with Diabetes:Non Diabetic:  <6%Goal of Therapy: <7%Additional Action Suggested:  >8%     Allergies as of 06/18/2018      Reactions   Cherry Rash   Lemon Oil Rash      Medication List       Accurate as of June 17, 2018  9:24 PM. Always use your most recent med list.        FREESTYLE LIBRE 14 DAY READER Devi USE READER TO CHECK BLOOD GLUCOSE WITH FREESTYLE LIBRE SENSORS.   FREESTYLE LIBRE 14 DAY SENSOR Misc 1 each by Does not apply route daily. Apply sensor to body once every 14 days to monitor blood sugar.   glucose blood test strip Commonly known as:  ONE TOUCH ULTRA TEST USE AS INSTRUCTED TO CHECK BLOOD SUGAR TWICE A DAY   insulin degludec 100 UNIT/ML Sopn FlexTouch Pen Commonly known as:  TRESIBA FLEXTOUCH INJECT 44 UNITS UNDER THE SKIN DAILY   insulin lispro 100 UNIT/ML KiwkPen Commonly known as:  HUMALOG KWIKPEN Inject 5 units three times a day with meals.   Insulin Pen Needle 32G X 5 MM Misc Commonly known as:  NOVOTWIST Use two per day to inject insulin   Insulin Pen Needle 32G X 4 MM Misc Commonly known as:  NOVOFINE PLUS Use 3 per day to inject insulin   Insulin Pen Needle 31G X 8 MM Misc Commonly known as:  B-D ULTRAFINE III SHORT PEN USE 3 NEEDLES PER DAY AS DIRECTED   lisinopril-hydrochlorothiazide 10-12.5 MG tablet Commonly known as:  PRINZIDE,ZESTORETIC TAKE 1 TABLET DAILY   metFORMIN 750 MG 24 hr tablet Commonly known as:  GLUCOPHAGE-XR TAKE 3 TABLETS (2,250 MG TOTAL) DAILY   metFORMIN 750 MG 24 hr  tablet Commonly known as:  GLUCOPHAGE-XR TAKE 3 TABLETS DAILY   omeprazole 20 MG capsule Commonly known as:  PRILOSEC Take 1 capsule (20 mg  total) by mouth daily.   ONE TOUCH DELICA LANCING DEV Misc Use to check blood sugar 2 times per day   ONETOUCH DELICA LANCETS 84X Misc USE TO CHECK BLOOD SUGARS TWICE A DAY   potassium chloride SA 20 MEQ tablet Commonly known as:  K-DUR,KLOR-CON TAKE 1 TABLET DAILY   rosuvastatin 20 MG tablet Commonly known as:  CRESTOR TAKE 1 TABLET DAILY   Semaglutide(0.25 or 0.5MG /DOS) 2 MG/1.5ML Sopn Commonly known as:  OZEMPIC (0.25 OR 0.5 MG/DOSE) Inject 0.5 mg into the skin once a week.   Vitamin D3 125 MCG (5000 UT) Caps Take by mouth.       Allergies:  Allergies  Allergen Reactions  . Cherry Rash  . Lemon Oil Rash    Past Medical History:  Diagnosis Date  . ASCUS (atypical squamous cells of undetermined significance) on Pap smear 08/06/1999  . Breast mass in female 2002   Left  . Diabetes mellitus without complication ([place])   . Fibroid uterus 2010  . Hypertension   . Irregular bleeding 2011  . 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 Yeast vaginitis 2006    Past Surgical History:  Procedure Laterality Date  . DILATATION & CURETTAGE/HYSTEROSCOPY WITH TRUECLEAR N/A 01/20/2014   Procedure: DILATATION & CURETTAGE/HYSTEROSCOPY WITH TRUCLEAR;  Surgeon: [name], MD;  Location: [facility];  Service: Gynecology;  Laterality: N/A;  . DILATATION & CURRETTAGE/HYSTEROSCOPY WITH RESECTOCOPE N/A 01/20/2014   Procedure: [place];  Surgeon: [name], MD;  Location: [facility];  Service: Gynecology;  Laterality: N/A;  . NO PAST SURGERIES      Family History  Problem Relation Age of Onset  . Diabetes Mother   . Diabetes Father   . Mental retardation Sister     Social History:  reports that she has never smoked. She has never used smokeless tobacco. She reports  current alcohol use. She reports that she does not use drugs.  REVIEW of systems:  She is being followed by her PCP for hypertension with Zestoretic 20 mg    BP Readings from Last 3 Encounters:  03/24/18 128/86  01/27/18 122/88  09/08/17 138/76     Has had hypercholesterolemia, taking Crestor 20 mg with good control, has history of mild increase in triglycerides Recent levels are available from July 2019 and cholesterol was 145 with LDL 78, his followed by PCP   Lab Results  Component Value Date   CHOL 155 07/08/2017   HDL 48.20 07/08/2017   LDLCALC 86 07/08/2017   LDLDIRECT 97.7 02/11/2014   TRIG 105.0 07/08/2017   CHOLHDL 3 07/08/2017      Examination:   There were no vitals taken for this visit.  There is no height or weight on file to calculate BMI.    Assessment/Plan:   Diabetes type 2, uncontrolled  See history of present illness for detailed discussion of her current management, blood sugar patterns and problems identified  Her A1c is usually over 8%, last 8.6  Currently on basal bolus insulin, metformin and again on Ozempic  Blood sugars are improved with her resuming Ozempic even though she is taking only 0.25 mg weekly She is afraid of taking a higher dose Discussed in detail the actions of basal insulin and bolus insulin and different roles of the 2 types of insulin She does not understand that mealtime insulin is likely make her weight go up and her weight is stable now only because her blood sugars stay high  after meals Discussed that she is insulin deficient She also can do much better with diet to help with her weight loss  Since she is reluctant to take full doses of Humalog with meals discussed that in insulin pump such as Omnipod would be much more helpful with reducing her insulin requirement and more efficient insulin delivery as well as convenience of mealtime insulin Explained to her the principles of insulin pumps She refuses to consider  this  However she agrees to try adding at least another 5 clicks on the dose of Ozempic beyond the 0.25 dose and gradually working up She will try to take at least 5 to 6 units of Humalog with at least lunch and dinner more regularly A1c on the next visit  HYPERTENSION: Fairly well controlled without microalbuminuria Continue to follow-up with PCP  Discussed benefits of influenza vaccine and she agrees to take this  Counseling time on subjects discussed in assessment and plan sections is over 50% of today's 25 minute visit   There are no Patient Instructions on file for this visit.     Elayne Snare 06/17/2018, 9:24 PM   Note: This office note was prepared with Dragon voice recognition system technology. Any transcriptional errors that result from this process are unintentional.

## 2018-06-18 ENCOUNTER — Ambulatory Visit: Payer: BLUE CROSS/BLUE SHIELD | Admitting: Endocrinology

## 2018-06-20 DIAGNOSIS — Z1231 Encounter for screening mammogram for malignant neoplasm of breast: Secondary | ICD-10-CM | POA: Diagnosis not present

## 2018-06-24 ENCOUNTER — Ambulatory Visit: Payer: BLUE CROSS/BLUE SHIELD | Admitting: Endocrinology

## 2018-06-24 ENCOUNTER — Encounter: Payer: Self-pay | Admitting: Endocrinology

## 2018-06-24 VITALS — BP 128/82 | HR 90 | Ht 69.0 in | Wt 241.6 lb

## 2018-06-24 DIAGNOSIS — E1165 Type 2 diabetes mellitus with hyperglycemia: Secondary | ICD-10-CM

## 2018-06-24 DIAGNOSIS — Z794 Long term (current) use of insulin: Secondary | ICD-10-CM

## 2018-06-24 DIAGNOSIS — I10 Essential (primary) hypertension: Secondary | ICD-10-CM

## 2018-06-24 NOTE — Progress Notes (Signed)
Patient ID: Alyssa Ross, female   DOB: Dec 20, 1965, 53 y.o.   MRN: [id number]   Reason for Appointment: Diabetes follow-up   History of Present Illness   Diagnosis: Type 2 DIABETES MELITUS, date of diagnosis 2005   She had previously been on metformin and Amaryl Because of inadequate control she was given Victoza in addition in 2011 With this her blood sugars have been significantly better and her A1c has been as low as 6.2 in 2013 Her blood sugars have been more difficult to control since 2014 an A1c consistently over 7% She  was started on insulin with Levemir in 06/2013 because of  increase in her A1c to 8.5% She had previously been taking Victoza along with Amaryl and metformin without consistent control Also had difficulty losing weight despite taking 1.8 mg Victoza, also had tried the 3 mg dose Because of episodic nausea and diarrhea in 1/16 she went off her Victoza  RECENT history:   INSULIN dose: TRESIBA 15  units pm.  Humalog recently none  Non-insulin hypoglycemic drugs:  metformin 2250 mg, Ozempic 0.25 mg weekly      A1c in 1/20 is now 9.2 compared to 8.6 previously   Recommendations on last visit: Try adding at least another 5 clicks on the dose of Ozempic beyond the 0.25 dose and gradually working up She will try to take at least 5 to 6 units of Humalog with at least lunch and dinner more regularly  Current management, blood sugars and problems identified as follows  Although she did try to increase her Ozempic after her last visit she says it was causing more diarrhea and GI distress and she went back to 0.25 mg  She did not take her Ozempic last Friday as directed  In the last 10 days or so she has obtained a diet from his sports consultant and is following this; with this she is having very little fat and carbohydrates and mostly chicken for protein.  She is getting up to 25 g of carbohydrate only per meal  She thinks she has lost 5 pounds  With her  sugars starting to get low overnight she cut down her insulin to 25 units about 5 days ago and last night she only took 15 units because her blood sugar had gone down to 59 the previous day  With this her postprandial readings are looking fairly good and only occasionally above 180  Her blood sugars on a daily basis from her CGM download range between 109 and 145 for the last week  She is also exercising up to 6 days a week at the gym  FASTING blood sugars are excellent     Side effects from medications: None   Monitors blood glucose: With freestyle Libre sensor  14-day blood Glucose data and averages by time of day from review of download today  CGM use % of time  98  2-week average/SD  151+/-59  Time in range       75 %  % Time Above 180  22  % Time above 250  75  % Time Below 70  3     PRE-MEAL Fasting Lunch Dinner Bedtime Overall  Glucose range:       Averages:  124   162  182  151   POST-MEAL PC Breakfast PC Lunch PC Dinner  Glucose range:     Averages:  147  160  166    Previous blood sugar average 174  Meals: 3 meals per day. Dinner 8-9 pm     DIET: As above  Physical activity: exercise:  3-6/7 going to the gym             Wt Readings from Last 3 Encounters:  06/24/18 241 lb 9.6 oz (109.6 kg)  03/24/18 243 lb (110.2 kg)  01/27/18 241 lb (109.3 kg)    LABS:  Lab Results  Component Value Date   HGBA1C 9.2 (H) 06/16/2018   HGBA1C 8.6 (H) 01/23/2018   HGBA1C 9.3 (H) 07/08/2017   Lab Results  Component Value Date   MICROALBUR 1.8 01/27/2018   LDLCALC 86 07/08/2017   CREATININE 0.96 06/16/2018    No visits with results within 1 Week(s) from this visit.  Latest known visit with results is:  Lab on 06/16/2018  Component Date Value Ref Range Status  . Sodium 06/16/2018 136  135 - 145 mEq/L Final  . Potassium 06/16/2018 4.0  3.5 - 5.1 mEq/L Final  . Chloride 06/16/2018 100  96 - 112 mEq/L Final  . CO2 06/16/2018 31  19 - 32 mEq/L Final  . Glucose,  Bld 06/16/2018 132* 70 - 99 mg/dL Final  . BUN 06/16/2018 23  6 - 23 mg/dL Final  . Creatinine, Ser 06/16/2018 0.96  0.40 - 1.20 mg/dL Final  . Total Bilirubin 06/16/2018 0.7  0.2 - 1.2 mg/dL Final  . Alkaline Phosphatase 06/16/2018 51  39 - 117 U/L Final  . AST 06/16/2018 15  0 - 37 U/L Final  . ALT 06/16/2018 14  0 - 35 U/L Final  . Total Protein 06/16/2018 7.3  6.0 - 8.3 g/dL Final  . Albumin 06/16/2018 4.2  3.5 - 5.2 g/dL Final  . Calcium 06/16/2018 9.9  8.4 - 10.5 mg/dL Final  . GFR 06/16/2018 78.40  >60.00 mL/min Final  . Hgb A1c MFr Bld 06/16/2018 9.2* 4.6 - 6.5 % Final   Glycemic Control Guidelines for People with Diabetes:Non Diabetic:  <6%Goal of Therapy: <7%Additional Action Suggested:  >8%     Allergies as of 06/24/2018      Reactions   Cherry Rash   Lemon Oil Rash      Medication List       Accurate as of June 24, 2018  4:46 PM. Always use your most recent med list.        FREESTYLE LIBRE 14 DAY READER Devi USE READER TO CHECK BLOOD GLUCOSE WITH FREESTYLE LIBRE SENSORS.   FREESTYLE LIBRE 14 DAY SENSOR Misc 1 each by Does not apply route daily. Apply sensor to body once every 14 days to monitor blood sugar.   glucose blood test strip Commonly known as:  ONE TOUCH ULTRA TEST USE AS INSTRUCTED TO CHECK BLOOD SUGAR TWICE A DAY   insulin degludec 100 UNIT/ML Sopn FlexTouch Pen Commonly known as:  TRESIBA FLEXTOUCH INJECT 44 UNITS UNDER THE SKIN DAILY   insulin lispro 100 UNIT/ML KiwkPen Commonly known as:  HUMALOG KWIKPEN Inject 5 units three times a day with meals.   Insulin Pen Needle 32G X 5 MM Misc Commonly known as:  NOVOTWIST Use two per day to inject insulin   Insulin Pen Needle 32G X 4 MM Misc Commonly known as:  NOVOFINE PLUS Use 3 per day to inject insulin   Insulin Pen Needle 31G X 8 MM Misc Commonly known as:  B-D ULTRAFINE III SHORT PEN USE 3 NEEDLES PER DAY AS DIRECTED   lisinopril-hydrochlorothiazide 10-12.5 MG tablet Commonly known  as:  PRINZIDE,ZESTORETIC  TAKE 1 TABLET DAILY   metFORMIN 750 MG 24 hr tablet Commonly known as:  GLUCOPHAGE-XR TAKE 3 TABLETS (2,250 MG TOTAL) DAILY   omeprazole 20 MG capsule Commonly known as:  PRILOSEC Take 1 capsule (20 mg total) by mouth daily.   ONE TOUCH DELICA LANCING DEV Misc Use to check blood sugar 2 times per day   ONETOUCH DELICA LANCETS 16X Misc USE TO CHECK BLOOD SUGARS TWICE A DAY   potassium chloride SA 20 MEQ tablet Commonly known as:  K-DUR,KLOR-CON TAKE 1 TABLET DAILY   rosuvastatin 20 MG tablet Commonly known as:  CRESTOR TAKE 1 TABLET DAILY   Semaglutide(0.25 or 0.5MG /DOS) 2 MG/1.5ML Sopn Commonly known as:  OZEMPIC (0.25 OR 0.5 MG/DOSE) Inject 0.5 mg into the skin once a week.   Vitamin D3 125 MCG (5000 UT) Caps Take by mouth.       Allergies:  Allergies  Allergen Reactions  . Cherry Rash  . Lemon Oil Rash    Past Medical History:  Diagnosis Date  . ASCUS (atypical squamous cells of undetermined significance) on Pap smear 08/06/1999  . Breast mass in female 2002   Left  . Diabetes mellitus without complication ([place])   . Fibroid uterus 2010  . Hypertension   . Irregular bleeding 2011  . 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 Yeast vaginitis 2006    Past Surgical History:  Procedure Laterality Date  . DILATATION & CURETTAGE/HYSTEROSCOPY WITH TRUECLEAR N/A 01/20/2014   Procedure: DILATATION & CURETTAGE/HYSTEROSCOPY WITH TRUCLEAR;  Surgeon: [name], MD;  Location: [facility];  Service: Gynecology;  Laterality: N/A;  . DILATATION & CURRETTAGE/HYSTEROSCOPY WITH RESECTOCOPE N/A 01/20/2014   Procedure: [place];  Surgeon: [name], MD;  Location: [facility];  Service: Gynecology;  Laterality: N/A;  . NO PAST SURGERIES      Family History  Problem Relation Age of Onset  . Diabetes Mother   . Diabetes Father   . Mental retardation Sister     Social History:   reports that she has never smoked. She has never used smokeless tobacco. She reports current alcohol use. She reports that she does not use drugs.  REVIEW of systems:  She is being followed by her PCP for hypertension with Zestoretic 20 mg   BP Readings from Last 3 Encounters:  06/24/18 128/82  03/24/18 128/86  01/27/18 122/88     Has had hypercholesterolemia, taking Crestor 20 mg with good control, has history of mild increase in triglycerides Labs done in July 2019 and cholesterol was 145 with LDL 78, these are being followed by PCP   Lab Results  Component Value Date   CHOL 155 07/08/2017   HDL 48.20 07/08/2017   LDLCALC 86 07/08/2017   LDLDIRECT 97.7 02/11/2014   TRIG 105.0 07/08/2017   CHOLHDL 3 07/08/2017      Examination:   BP 128/82 (BP Location: Left Arm, Patient Position: Sitting, Cuff Size: Normal)   Pulse 90   Ht 5\' 9"  (1.753 m)   Wt 241 lb 9.6 oz (109.6 kg)   SpO2 98%   BMI 35.68 kg/m   Body mass index is 35.68 kg/m.    Assessment/Plan:   Diabetes type 2, uncontrolled  See history of present illness for detailed discussion of her current management, blood sugar patterns and problems identified  Her A1c is usually over 8%, recently 9.2 and higher than before  However in the last 10 days or so she has markedly  reduced her caloric intake carbohydrates and fat intake on her own With this she has been able to cut down on her insulin from 44 units down to 15 units This morning her blood sugar was still fairly good with lower insulin doses Although she has been taking Ozempic 0.25 mg she thinks it causes GI distress and loose stools and does not want to take it now  With her severely restricted diet she is starting to lose weight Although discussed that she does need to have some amount of monounsaturated fat in her diet consistently she does not want to change her program as yet  She will continue to watch her sugars and if her morning sugars are  staying below 90 she will cut her insulin down to 10 units and subsequently can stop Encourage her to stay on metformin but if her blood sugars start going up she may need to go back to Ozempic Discussed postprandial targets She needs to continue watching her sugars after meals and gradually eat more balanced meals without excessive carbohydrate  HYPERTENSION: Followed by PCP    There are no Patient Instructions on file for this visit.   Counseling time on subjects discussed in assessment and plan sections is over 50% of today's 25 minute visit   Elayne Snare 06/24/2018, 4:46 PM   Note: This office note was prepared with Dragon voice recognition system technology. Any transcriptional errors that result from this process are unintentional.

## 2018-07-11 ENCOUNTER — Ambulatory Visit: Payer: BLUE CROSS/BLUE SHIELD | Admitting: Internal Medicine

## 2018-07-11 ENCOUNTER — Encounter: Payer: Self-pay | Admitting: Internal Medicine

## 2018-07-11 VITALS — BP 122/74 | HR 73 | Temp 98.2°F | Ht 67.8 in | Wt 230.0 lb

## 2018-07-11 DIAGNOSIS — Z794 Long term (current) use of insulin: Secondary | ICD-10-CM

## 2018-07-11 DIAGNOSIS — I10 Essential (primary) hypertension: Secondary | ICD-10-CM | POA: Diagnosis not present

## 2018-07-11 DIAGNOSIS — E1165 Type 2 diabetes mellitus with hyperglycemia: Secondary | ICD-10-CM

## 2018-07-11 DIAGNOSIS — Z0000 Encounter for general adult medical examination without abnormal findings: Secondary | ICD-10-CM | POA: Diagnosis not present

## 2018-07-11 LAB — POCT UA - MICROALBUMIN
Albumin/Creatinine Ratio, Urine, POC: <30
Creatinine, POC: 300 mg/dL
Microalbumin Ur, POC: 10 mg/L

## 2018-07-11 LAB — POCT URINALYSIS DIPSTICK
Bilirubin, UA: NEGATIVE
Blood, UA: NEGATIVE
Glucose, UA: NEGATIVE
Ketones, UA: NEGATIVE
Leukocytes, UA: NEGATIVE
Nitrite, UA: NEGATIVE
Protein, UA: NEGATIVE
Spec Grav, UA: 1.02 (ref 1.010–1.025)
Urobilinogen, UA: 0.2 E.U./dL
pH, UA: 7 (ref 5.0–8.0)

## 2018-07-11 NOTE — Progress Notes (Signed)
Subjective:     Patient ID: Alyssa Ross , female    DOB: [birth year] , 53 y.o.   MRN: [id number]   Chief Complaint  Patient presents with  . Annual Exam  . Diabetes  . Hypertension    HPI  She is here today for a full physical examination. She is followed by Dr. Charlesetta Garibaldi for her pap smears.  She has no specific concerns or complaints at this time.   Diabetes  She presents for her follow-up diabetic visit. She has type 2 diabetes mellitus. Her disease course has been improving. Pertinent negatives for diabetes include no blurred vision, no chest pain and no fatigue. There are no hypoglycemic complications. There are no diabetic complications. Risk factors for coronary artery disease include diabetes mellitus, hypertension, dyslipidemia and obesity. She is compliant with treatment most of the time. Her weight is decreasing steadily. She participates in exercise daily. Her breakfast blood glucose is taken between 7-8 am. Her breakfast blood glucose range is generally 90-110 mg/dl. An ACE inhibitor/angiotensin II receptor blocker is being taken. Eye exam is current.  Hypertension  This is a chronic problem. The current episode started more than 1 year ago. The problem has been gradually improving since onset. The problem is controlled. Pertinent negatives include no blurred vision or chest pain.   She reports compliance with meds.   Past Medical History:  Diagnosis Date  . ASCUS (atypical squamous cells of undetermined significance) on Pap smear 08/06/1999  . Breast mass in female 2002   Left  . Diabetes mellitus without complication ([place])   . Fibroid uterus 2010  . Hypertension   . Irregular bleeding 2011  . 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 Yeast vaginitis 2006     Family History  Problem Relation Age of Onset  . Diabetes Mother   . Dementia Mother   . Hyperlipidemia Mother   . Hypertension Mother   . Irregular heart beat Mother   . Diabetes Father    . Hypertension Father   . Hyperlipidemia Father   . Down syndrome Sister   . Diabetes Sister   . Hyperlipidemia Brother   . Heart Problems Brother      Current Outpatient Medications:  .  Cholecalciferol (VITAMIN D3) 5000 units CAPS, Take by mouth., Disp: , Rfl:  .  Continuous Blood Gluc Receiver (FREESTYLE LIBRE 14 DAY READER) DEVI, USE READER TO CHECK BLOOD GLUCOSE WITH FREESTYLE LIBRE SENSORS., Disp: 1 Device, Rfl: 0 .  Continuous Blood Gluc Sensor (FREESTYLE LIBRE 14 DAY SENSOR) MISC, 1 each by Does not apply route daily. Apply sensor to body once every 14 days to monitor blood sugar., Disp: 4 each, Rfl: 3 .  glucose blood (ONE TOUCH ULTRA TEST) test strip, USE AS INSTRUCTED TO CHECK BLOOD SUGAR TWICE A DAY, Disp: 200 each, Rfl: 2 .  insulin degludec (TRESIBA FLEXTOUCH) 100 UNIT/ML SOPN FlexTouch Pen, INJECT 44 UNITS UNDER THE SKIN DAILY (Patient taking differently: 25 Units. INJECT 25 UNITS UNDER THE SKIN DAILY), Disp: 14 pen, Rfl: 3 .  insulin lispro (HUMALOG KWIKPEN) 100 UNIT/ML KiwkPen, Inject 5 units three times a day with meals., Disp: 15 mL, Rfl: 3 .  Insulin Pen Needle (NOVOFINE PLUS) 32G X 4 MM MISC, Use 3 per day to inject insulin, Disp: 100 each, Rfl: 1 .  lisinopril-hydrochlorothiazide (PRINZIDE,ZESTORETIC) 10-12.5 MG tablet, TAKE 1 TABLET DAILY, Disp: 90 tablet, Rfl: 4 .  metFORMIN (GLUCOPHAGE-XR) 750 MG 24 hr tablet, TAKE 3 TABLETS (2,250 MG TOTAL)  DAILY, Disp: 270 tablet, Rfl: 2 .  ONETOUCH DELICA LANCETS 26R MISC, USE TO CHECK BLOOD SUGARS TWICE A DAY, Disp: 200 each, Rfl: 2 .  potassium chloride SA (K-DUR,KLOR-CON) 20 MEQ tablet, TAKE 1 TABLET DAILY, Disp: 90 tablet, Rfl: 4 .  rosuvastatin (CRESTOR) 20 MG tablet, TAKE 1 TABLET DAILY, Disp: 90 tablet, Rfl: 4 .  Semaglutide (OZEMPIC) 0.25 or 0.5 MG/DOSE SOPN, Inject 0.5 mg into the skin once a week. (Patient not taking: Reported on 07/11/2018), Disp: 3 pen, Rfl: 3   Allergies  Allergen Reactions  . Cherry Rash  . Lemon  Oil Rash     No LMP recorded. (Menstrual status: Perimenopausal).. . Negative for: breast discharge, breast lump(s), breast pain and breast self exam. Associated symptoms include abnormal vaginal bleeding. Pertinent negatives include abnormal bleeding (hematology), anxiety, decreased libido, depression, difficulty falling sleep, dyspareunia, history of infertility, nocturia, sexual dysfunction, sleep disturbances, urinary incontinence, urinary urgency, vaginal discharge and vaginal itching. Diet regular.The patient states her exercise level is  moderate.   . The patient's tobacco use is:  Social History   Tobacco Use  Smoking Status Never Smoker  Smokeless Tobacco Never Used  . She has been exposed to passive smoke. The patient's alcohol use is:  Social History   Substance and Sexual Activity  Alcohol Use Yes   Comment: rarely  . Additional information: Last pap 2018, next one scheduled for 2020.    Review of Systems  Constitutional: Negative.  Negative for fatigue.  HENT: Negative.   Eyes: Negative.  Negative for blurred vision.  Respiratory: Negative.   Cardiovascular: Negative.  Negative for chest pain.  Gastrointestinal: Negative.   Endocrine: Negative.   Genitourinary: Negative.   Musculoskeletal: Negative.   Skin: Negative.   Allergic/Immunologic: Negative.   Neurological: Negative.   Hematological: Negative.   Psychiatric/Behavioral: Negative.      Today's Vitals   07/11/18 0952  BP: 122/74  Pulse: 73  Temp: 98.2 F (36.8 C)  TempSrc: Oral  Weight: 230 lb (104.3 kg)  Height: 5' 7.8" (1.722 m)   Body mass index is 35.18 kg/m.   Objective:  Physical Exam Vitals signs and nursing note reviewed.  Constitutional:      Appearance: Normal appearance. She is obese.  HENT:     Head: Normocephalic and atraumatic.     Right Ear: Tympanic membrane, ear canal and external ear normal.     Left Ear: Tympanic membrane, ear canal and external ear normal.     Nose: Nose  normal.     Mouth/Throat:     Mouth: Mucous membranes are moist.     Pharynx: Oropharynx is clear.  Eyes:     Extraocular Movements: Extraocular movements intact.     Conjunctiva/sclera: Conjunctivae normal.     Pupils: Pupils are equal, round, and reactive to light.  Neck:     Musculoskeletal: Normal range of motion and neck supple.  Cardiovascular:     Rate and Rhythm: Normal rate and regular rhythm.     Pulses: Normal pulses.          Dorsalis pedis pulses are 2+ on the right side and 2+ on the left side.       Posterior tibial pulses are 2+ on the right side and 2+ on the left side.     Heart sounds: Normal heart sounds.  Pulmonary:     Effort: Pulmonary effort is normal.     Breath sounds: Normal breath sounds.  Chest:  Breasts:        Right: Normal. No swelling, bleeding, inverted nipple, mass, nipple discharge or skin change.        Left: Normal. No swelling, bleeding, inverted nipple, mass, nipple discharge or skin change.  Abdominal:     General: Bowel sounds are normal.     Palpations: Abdomen is soft.  Genitourinary:    Comments: deferred Musculoskeletal: Normal range of motion.  Feet:     Right foot:     Protective Sensation: 5 sites tested. 5 sites sensed.     Skin integrity: Skin integrity normal.     Left foot:     Protective Sensation: 5 sites tested. 5 sites sensed.     Skin integrity: Skin integrity normal.  Skin:    General: Skin is warm and dry.     Capillary Refill: Capillary refill takes less than 2 seconds.  Neurological:     General: No focal deficit present.     Mental Status: She is alert and oriented to person, place, and time.  Psychiatric:        Mood and Affect: Mood normal.         Assessment And Plan:     1. Routine general medical examination at health care facility  A full exam was performed.  Importance of monthly self breast exams was discussed with the patient. PATIENT HAS BEEN ADVISED TO GET 30-45 MINUTES REGULAR EXERCISE NO  LESS THAN FOUR TO FIVE DAYS PER WEEK - BOTH WEIGHTBEARING EXERCISES AND AEROBIC ARE RECOMMENDED.  SHE IS ADVISED TO FOLLOW A HEALTHY DIET WITH AT LEAST SIX FRUITS/VEGGIES PER DAY, DECREASE INTAKE OF RED MEAT, AND TO INCREASE FISH INTAKE TO TWO DAYS PER WEEK.  MEATS/FISH SHOULD NOT BE FRIED, BAKED OR BROILED IS PREFERABLE.  I SUGGEST WEARING SPF 50 SUNSCREEN ON EXPOSED PARTS AND ESPECIALLY WHEN IN THE DIRECT SUNLIGHT FOR AN EXTENDED PERIOD OF TIME.  PLEASE AVOID FAST FOOD RESTAURANTS AND INCREASE YOUR WATER INTAKE.  - CBC - Lipid panel - Cologuard; Future - HIV antibody (with reflex)  2. Uncontrolled type 2 diabetes mellitus with hyperglycemia ([facility])  Diabetic foot exam was performed.  I DISCUSSED WITH THE PATIENT AT LENGTH THE GOALS OF GLYCEMIC CONTROL AND POSSIBLE LONG-TERM COMPLICATIONS.  I  ALSO STRESSED THE IMPORTANCE OF COMPLIANCE WITH HOME GLUCOSE MONITORING, DIETARY RESTRICTIONS INCLUDING AVOIDANCE OF SUGARY DRINKS/PROCESSED FOODS,  ALONG WITH REGULAR EXERCISE.  I  ALSO STRESSED THE IMPORTANCE OF ANNUAL EYE EXAMS, SELF FOOT CARE AND COMPLIANCE WITH OFFICE VISITS.  - POCT Urinalysis Dipstick (81002) - POCT UA - Microalbumin  3. Essential hypertension, benign  Well controlled. She will continue with current meds for now. She is encouraged to avoid adding salt to her foods. EKG performed, this is normal. She will rto in six months for re-evaluation. She was advised that she may need an adjustment in her meds as she continues to lose weight. She will let me know if she develops persistent lightheadedness/dizziness.   - EKG 12-Lead       Maximino Greenland, MD

## 2018-07-11 NOTE — Patient Instructions (Signed)

## 2018-07-12 LAB — CBC
Hematocrit: 36.9 % (ref 34.0–46.6)
Hemoglobin: 11.9 g/dL (ref 11.1–15.9)
MCH: 28.2 pg (ref 26.6–33.0)
MCHC: 32.2 g/dL (ref 31.5–35.7)
MCV: 87 fL (ref 79–97)
PLATELETS: 478 10*3/uL — AB (ref 150–450)
RBC: 4.22 x10E6/uL (ref 3.77–5.28)
RDW: 13 % (ref 11.7–15.4)
WBC: 5.2 10*3/uL (ref 3.4–10.8)

## 2018-07-12 LAB — LIPID PANEL
Chol/HDL Ratio: 2.5 ratio (ref 0.0–4.4)
Cholesterol, Total: 129 mg/dL (ref 100–199)
HDL: 52 mg/dL (ref >39)
LDL Calculated: 63 mg/dL (ref 0–99)
Triglycerides: 69 mg/dL (ref 0–149)
VLDL Cholesterol Cal: 14 mg/dL (ref 5–40)

## 2018-07-12 LAB — HIV ANTIBODY (ROUTINE TESTING W REFLEX): HIV Screen 4th Generation wRfx: NONREACTIVE

## 2018-07-31 ENCOUNTER — Other Ambulatory Visit: Payer: Self-pay | Admitting: Endocrinology

## 2018-08-26 ENCOUNTER — Other Ambulatory Visit: Payer: Self-pay

## 2018-08-26 ENCOUNTER — Encounter: Payer: Self-pay | Admitting: Internal Medicine

## 2018-08-26 MED ORDER — INSULIN PEN NEEDLE 32G X 4 MM MISC: 1 refills | Status: DC

## 2018-09-03 DIAGNOSIS — Z1211 Encounter for screening for malignant neoplasm of colon: Secondary | ICD-10-CM | POA: Diagnosis not present

## 2018-09-03 LAB — COLOGUARD: Cologuard: NEGATIVE

## 2018-09-11 ENCOUNTER — Encounter: Payer: Self-pay | Admitting: Internal Medicine

## 2018-09-14 ENCOUNTER — Encounter: Payer: Self-pay | Admitting: Internal Medicine

## 2018-09-21 ENCOUNTER — Other Ambulatory Visit: Payer: BLUE CROSS/BLUE SHIELD

## 2018-09-24 ENCOUNTER — Ambulatory Visit: Payer: BLUE CROSS/BLUE SHIELD | Admitting: Endocrinology

## 2018-11-12 ENCOUNTER — Other Ambulatory Visit: Payer: Self-pay

## 2018-11-12 ENCOUNTER — Other Ambulatory Visit (INDEPENDENT_AMBULATORY_CARE_PROVIDER_SITE_OTHER): Payer: BC Managed Care – PPO

## 2018-11-12 DIAGNOSIS — E1165 Type 2 diabetes mellitus with hyperglycemia: Secondary | ICD-10-CM | POA: Diagnosis not present

## 2018-11-12 DIAGNOSIS — Z794 Long term (current) use of insulin: Secondary | ICD-10-CM

## 2018-11-12 LAB — BASIC METABOLIC PANEL
BUN: 17 mg/dL (ref 6–23)
CO2: 28 mEq/L (ref 19–32)
Calcium: 9.9 mg/dL (ref 8.4–10.5)
Chloride: 101 mEq/L (ref 96–112)
Creatinine, Ser: 0.97 mg/dL (ref 0.40–1.20)
GFR: 72.77 mL/min (ref >60.00)
Glucose, Bld: 127 mg/dL — ABNORMAL HIGH (ref 70–99)
Potassium: 4.2 mEq/L (ref 3.5–5.1)
Sodium: 139 mEq/L (ref 135–145)

## 2018-11-12 LAB — HEMOGLOBIN A1C: Hgb A1c MFr Bld: 9.2 % — ABNORMAL HIGH (ref 4.6–6.5)

## 2018-11-16 ENCOUNTER — Other Ambulatory Visit: Payer: Self-pay

## 2018-11-16 ENCOUNTER — Encounter: Payer: Self-pay | Admitting: Endocrinology

## 2018-11-16 ENCOUNTER — Ambulatory Visit (INDEPENDENT_AMBULATORY_CARE_PROVIDER_SITE_OTHER): Payer: BC Managed Care – PPO | Admitting: Endocrinology

## 2018-11-16 DIAGNOSIS — E1165 Type 2 diabetes mellitus with hyperglycemia: Secondary | ICD-10-CM | POA: Diagnosis not present

## 2018-11-16 NOTE — Progress Notes (Signed)
Patient ID: Alyssa Ross, female   DOB: 11-Aug-1965, 53 y.o.   MRN: [id number]  Today's office visit was provided via telemedicine using video technique The patient was explained the limitations of evaluation and management by telemedicine and the availability of in person appointments.  The patient understood the limitations and agreed to proceed. Patient also understood that the telehealth visit is billable. . Location of the patient: Patient's home . Location of the provider: Physician office Only the patient and myself were participating in the encounter    Reason for Appointment: Diabetes follow-up   History of Present Illness   Diagnosis: Type 2 DIABETES MELITUS, date of diagnosis 2005   She had previously been on metformin and Amaryl Because of inadequate control she was given Victoza in addition in 2011 With this her blood sugars have been significantly better and her A1c has been as low as 6.2 in 2013 Her blood sugars have been more difficult to control since 2014 an A1c consistently over 7% She  was started on insulin with Levemir in 06/2013 because of  increase in her A1c to 8.5% She had previously been taking Victoza along with Amaryl and metformin without consistent control Also had difficulty losing weight despite taking 1.8 mg Victoza, also had tried the 3 mg dose Because of episodic nausea and diarrhea in 1/16 she went off her Victoza  RECENT history:   INSULIN dose: TRESIBA 29units pm.  Humalog recently none  Non-insulin hypoglycemic drugs:  metformin 2250 mg,  A1c is still the same at 9.2   Current management, blood sugars and problems identified as follows  She stopped taking her Ozempic on the last visit because of nausea and reportedly having blurred vision  Her blood sugars for the last 2 weeks are overall higher than on her last visit  She takes Humalog only once every couple of weeks even though she is having fairly consistently high readings  after meals  She claims that even with 5 units for blood sugar will get low  She is also concerned that it will cause weight gain  She thinks she is cutting back on carbohydrates and frequently not eating more than 25 g at any time  Also she thinks she is trying to eat 5 small meals but still has readings going over 180 after meals on a daily basis at various times  She thinks she is trying to get some exercise 3 to 4 days a week but may not be aerobic all the time and mostly either walking or home weight  FASTING blood sugars are usually fairly good but rarely may have mild hypoglycemia during the night  She had been taking much less insulin for her Tyler Aas on her last visit; she had gone up further after stopping Ozempic  Now is again trying to cut back her Tresiba dose but does not appear to adjust this according to her morning or overnight blood sugar patterns     Side effects from medications: None   Monitors blood glucose: With freestyle Libre sensor  14-day blood Glucose data and averages by time of day from review of download today   CONTINUOUS GLUCOSE MONITORING RECORD INTERPRETATION    Dates of Recording: 5/22 through 11/11/2012  Sensor description: [employer]  Results statistics:   CGM use % of time  94  Average and SD  164  Time in range        60%  % Time Above 180  28  % Time  above 250  10  % Time Below target  2    Glycemic patterns summary: Blood sugars are near normal early morning and gradually start rising after about 8-9 AM reaching a peak by about 6-7 PM and may then gradually decline after midnight Blood sugars show more variability between 5 PM and 1 AM but average blood sugar is over 200 consistently between 4 PM and midnight Blood sugar may be low normal overnight or early morning occasionally  Hyperglycemic episodes are occurring to variable extent in the early afternoon and evenings times for relatively long periods of time Overall  hyperglycemia may occur more gradually or sometimes more abruptly at times based on her meal content likely With this her average blood sugar is consistently over 200 between 4 PM until midnight  Hypoglycemic episodes were minimal recently documented low normal although only on 3 days transiently overnight and 7-8 AM  Overnight periods: Blood sugars are starting at 180 average at midnight and progressively decreasing to near normal readings by 6-7 AM with some mild hypoglycemia as above  Preprandial periods: Blood sugars are near normal at breakfast time Around midday her blood sugars are averaging about 140 with mild variability Blood sugars around 6-8 PM fluctuate more and are averaging over 200  Postprandial periods:   After breakfast:   Blood sugars are rising modestly without excessive hypoglycemia After lunch:   Blood sugars are variably high with some postprandial spikes and the same patter seen after dinner: Blood sugars may sometimes rise more slowly in the afternoons or evenings   PRE-MEAL Fasting Lunch Dinner Bedtime Overall  Glucose range:       Mean/median:  99  145  212   164   POST-MEAL PC Breakfast PC Lunch PC Dinner  Glucose range:     Mean/median:  137  191  218    Previous data:  CGM use % of time  98  2-week average/SD  151+/-59  Time in range       75 %  % Time Above 180  22  % Time above 250  75  % Time Below 70  3      Meals: 3 meals per day. Dinner 8-9 pm     DIET: As above  Physical activity: exercise:  3-6/7 going to the gym             Wt Readings from Last 3 Encounters:  07/11/18 230 lb (104.3 kg)  06/24/18 241 lb 9.6 oz (109.6 kg)  03/24/18 243 lb (110.2 kg)    LABS:  Lab Results  Component Value Date   HGBA1C 9.2 (H) 11/12/2018   HGBA1C 9.2 (H) 06/16/2018   HGBA1C 8.6 (H) 01/23/2018   Lab Results  Component Value Date   MICROALBUR 10 07/11/2018   LDLCALC 63 07/11/2018   CREATININE 0.97 11/12/2018    Lab on 11/12/2018   Component Date Value Ref Range Status  . Sodium 11/12/2018 139  135 - 145 mEq/L Final  . Potassium 11/12/2018 4.2  3.5 - 5.1 mEq/L Final  . Chloride 11/12/2018 101  96 - 112 mEq/L Final  . CO2 11/12/2018 28  19 - 32 mEq/L Final  . Glucose, Bld 11/12/2018 127* 70 - 99 mg/dL Final  . BUN 11/12/2018 17  6 - 23 mg/dL Final  . Creatinine, Ser 11/12/2018 0.97  0.40 - 1.20 mg/dL Final  . Calcium 11/12/2018 9.9  8.4 - 10.5 mg/dL Final  . GFR 11/12/2018 72.77  >60.00 mL/min Final  .  Hgb A1c MFr Bld 11/12/2018 9.2* 4.6 - 6.5 % Final   Glycemic Control Guidelines for People with Diabetes:Non Diabetic:  <6%Goal of Therapy: <7%Additional Action Suggested:  >8%     Allergies as of 11/16/2018      Reactions   Cherry Rash   Lemon Oil Rash      Medication List       Accurate as of November 16, 2018  8:23 AM. If you have any questions, ask your nurse or doctor.        STOP taking these medications   Semaglutide(0.25 or 0.5MG /DOS) 2 MG/1.5ML Sopn Commonly known as:  Ozempic (0.25 or 0.5 MG/DOSE) Stopped by:  Elayne Snare, MD     TAKE these medications   FreeStyle Libre 14 Day Reader Kerrin Mo USE READER TO CHECK BLOOD GLUCOSE WITH FREESTYLE LIBRE SENSORS.   FreeStyle Libre 14 Day Sensor Misc APPLY SENSOR TO BODY ONCE EVERY 14 DAYS TO MONITOR BLOOD GLUCOSE   glucose blood test strip Commonly known as:  ONE TOUCH ULTRA TEST USE AS INSTRUCTED TO CHECK BLOOD SUGAR TWICE A DAY   insulin degludec 100 UNIT/ML Sopn FlexTouch Pen Commonly known as:  Tyler Aas FlexTouch INJECT 44 UNITS UNDER THE SKIN DAILY What changed:    how much to take  additional instructions   insulin lispro 100 UNIT/ML KiwkPen Commonly known as:  HumaLOG KwikPen Inject 5 units three times a day with meals.   Insulin Pen Needle 32G X 4 MM Misc Commonly known as:  NovoFine Plus Use 3 per day to inject insulin   lisinopril-hydrochlorothiazide 10-12.5 MG tablet Commonly known as:  ZESTORETIC TAKE 1 TABLET DAILY   metFORMIN  750 MG 24 hr tablet Commonly known as:  GLUCOPHAGE-XR TAKE 3 TABLETS (2,250 MG TOTAL) DAILY   OneTouch Delica Lancets 24M Misc USE TO CHECK BLOOD SUGARS TWICE A DAY   potassium chloride SA 20 MEQ tablet Commonly known as:  K-DUR TAKE 1 TABLET DAILY   rosuvastatin 20 MG tablet Commonly known as:  CRESTOR TAKE 1 TABLET DAILY   Vitamin D3 125 MCG (5000 UT) Caps Take by mouth.       Allergies:  Allergies  Allergen Reactions  . Cherry Rash  . Lemon Oil Rash    Past Medical History:  Diagnosis Date  . ASCUS (atypical squamous cells of undetermined significance) on Pap smear 08/06/1999  . Breast mass in female 2002   Left  . Diabetes mellitus without complication ([place])   . Fibroid uterus 2010  . Hypertension   . Irregular bleeding 2011  . 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 Yeast vaginitis 2006    Past Surgical History:  Procedure Laterality Date  . DILATATION & CURETTAGE/HYSTEROSCOPY WITH TRUECLEAR N/A 01/20/2014   Procedure: DILATATION & CURETTAGE/HYSTEROSCOPY WITH TRUCLEAR;  Surgeon: [name], MD;  Location: [facility];  Service: Gynecology;  Laterality: N/A;  . DILATATION & CURRETTAGE/HYSTEROSCOPY WITH RESECTOCOPE N/A 01/20/2014   Procedure: [place];  Surgeon: [name], MD;  Location: [facility];  Service: Gynecology;  Laterality: N/A;  . NO PAST SURGERIES      Family History  Problem Relation Age of Onset  . Diabetes Mother   . Dementia Mother   . Hyperlipidemia Mother   . Hypertension Mother   . Irregular heart beat Mother   . Diabetes Father   . Hypertension Father   . Hyperlipidemia Father   . Down syndrome Sister   . Diabetes Sister   .  Hyperlipidemia Brother   . Heart Problems Brother     Social History:  reports that she has never smoked. She has never used smokeless tobacco. She reports current alcohol use. She reports that she does not use drugs.  REVIEW of systems:   She is being followed by her PCP for hypertension Prescribed Zestoretic 20 mg   BP Readings from Last 3 Encounters:  07/11/18 122/74  06/24/18 128/82  03/24/18 128/86     Has had hypercholesterolemia, taking Crestor 20 mg with good control, has history of mild increase in triglycerides also  Labs as follows  Lab Results  Component Value Date   CHOL 129 07/11/2018   HDL 52 07/11/2018   LDLCALC 63 07/11/2018   LDLDIRECT 97.7 02/11/2014   TRIG 69 07/11/2018   CHOLHDL 2.5 07/11/2018      Examination:   There were no vitals taken for this visit.  There is no height or weight on file to calculate BMI.    Assessment/Plan:   Diabetes type 2, uncontrolled  See history of present illness for detailed discussion of her current management, blood sugar patterns and problems identified  Her A1c is usually over 8%, again at 9.2  She is insulin-dependent Although he can benefit from GLP-1 drugs and SGLT2 drugs he refuses to take this She does appear to have GI side effects from [place] but it was helping her sugar She is not wanting to take Humalog as directed either for fear of hypoglycemia or weight gain as before Although she thinks that she is cutting back on her carbohydrates and exercising blood sugars are not improving Weight has come down reportedly  She is not adjusting her management based on her blood sugar patterns that she can view on her freestyle libre Blood sugars have been averaging over 200 in the afternoon and evening which are postprandial even when she is trying to eat small meals Currently has only 60% of her readings below 180 on her freestyle libre recently  Currently not estimating mealtime dose based on carbohydrate counting This will be useful to her She also needs to keep a diary of what she is eating along with pre-and postprandial blood sugars and how much insulin she is taking Discussed that she needs to be taking mealtime shots every day and not  randomly once in a while and before she starts eating  HYPERTENSION: Followed by PCP  Lipids: Last lipid panel was excellent  There are no Patient Instructions on file for this visit.   Counseling time on subjects discussed in assessment and plan sections is over 50% of today's 25 minute visit   Elayne Snare 11/16/2018, 8:23 AM   Note: This office note was prepared with Dragon voice recognition system technology. Any transcriptional errors that result from this process are unintentional.

## 2018-11-24 ENCOUNTER — Ambulatory Visit: Payer: BC Managed Care – PPO | Admitting: Nutrition

## 2018-11-24 ENCOUNTER — Encounter: Payer: BC Managed Care – PPO | Attending: Endocrinology | Admitting: Nutrition

## 2018-11-24 ENCOUNTER — Other Ambulatory Visit: Payer: Self-pay

## 2018-11-24 DIAGNOSIS — E1165 Type 2 diabetes mellitus with hyperglycemia: Secondary | ICD-10-CM | POA: Insufficient documentation

## 2018-11-25 NOTE — Progress Notes (Signed)
Patient is seeing a dietitian, and did not wish any diet information.  She is eating small meals of between 15 and 25 carbs, with 7 grams of protein every 3 hours.  She is now walking 30-40 min. Every day.  She is taking carb of 2 people full time and "has little time for herself".  Says stress level is high.  Discussed how this affects blood sugar readings, and options for her to help reduce stress. Discussed how each of her medications work to lower her blood sugar, and why/when she is going to need her Humalog insulin for her meals.   She does not want to take this, thinking that this will make her gain weight.  Discussed insulin's role,and when and how it will do this.  Discussed the idea of insulin resistance, and ways to reduce this.  Disucssed that if her 2hr. pc readings are over 170, she will need the Humalog insulin with those meals, especially if HS readings are over 150.  Discussed the need to reduce the stress on her pancrease,and what happens to the insulin resistance when blood sugars are elevated. She did not want to give me readings, but said that she will start this when she gets home.  Discussed goals for blood sugar readings both before and 2hr. After.  Before: less than 110,and 2hr. pc readings, less than 50 points higher the premeal reaidngs.  She reported good understanding of this. I will call her in one week to see how she is doing.

## 2018-11-25 NOTE — Patient Instructions (Signed)
Test blood sugars before and 2hours after the meals.  If readings are more that 60 points higher, she will need to use the Humalog before those meals.

## 2018-12-01 ENCOUNTER — Ambulatory Visit: Payer: BC Managed Care – PPO | Admitting: Internal Medicine

## 2018-12-01 ENCOUNTER — Encounter: Payer: Self-pay | Admitting: Internal Medicine

## 2018-12-01 ENCOUNTER — Other Ambulatory Visit: Payer: Self-pay

## 2018-12-01 VITALS — BP 116/74 | HR 100 | Temp 98.8°F | Ht 67.8 in | Wt 221.2 lb

## 2018-12-01 DIAGNOSIS — R3989 Other symptoms and signs involving the genitourinary system: Secondary | ICD-10-CM

## 2018-12-01 DIAGNOSIS — R5383 Other fatigue: Secondary | ICD-10-CM | POA: Diagnosis not present

## 2018-12-01 DIAGNOSIS — I10 Essential (primary) hypertension: Secondary | ICD-10-CM | POA: Diagnosis not present

## 2018-12-01 DIAGNOSIS — Z79899 Other long term (current) drug therapy: Secondary | ICD-10-CM | POA: Diagnosis not present

## 2018-12-01 DIAGNOSIS — D649 Anemia, unspecified: Secondary | ICD-10-CM

## 2018-12-01 LAB — POCT URINALYSIS DIPSTICK
Blood, UA: NEGATIVE
Glucose, UA: POSITIVE — AB
Ketones, UA: NEGATIVE
Nitrite, UA: NEGATIVE
Protein, UA: POSITIVE — AB
Spec Grav, UA: 1.02 (ref 1.010–1.025)
Urobilinogen, UA: 1 E.U./dL
pH, UA: 5.5 (ref 5.0–8.0)

## 2018-12-01 LAB — POCT UA - MICROALBUMIN
Albumin/Creatinine Ratio, Urine, POC: <30
Creatinine, POC: 200 mg/dL
Microalbumin Ur, POC: 30 mg/L

## 2018-12-01 NOTE — Patient Instructions (Signed)

## 2018-12-02 LAB — IRON AND TIBC
Iron Saturation: 23 % (ref 15–55)
Iron: 86 ug/dL (ref 27–159)
Total Iron Binding Capacity: 375 ug/dL (ref 250–450)
UIBC: 289 ug/dL (ref 131–425)

## 2018-12-02 LAB — BMP8+EGFR
BUN/Creatinine Ratio: 17 (ref 9–23)
BUN: 18 mg/dL (ref 6–24)
CO2: 23 mmol/L (ref 20–29)
Calcium: 10 mg/dL (ref 8.7–10.2)
Chloride: 95 mmol/L — ABNORMAL LOW (ref 96–106)
Creatinine, Ser: 1.06 mg/dL — ABNORMAL HIGH (ref 0.57–1.00)
GFR calc Af Amer: 70 mL/min/{1.73_m2} (ref >59)
GFR calc non Af Amer: 61 mL/min/{1.73_m2} (ref >59)
Glucose: 290 mg/dL — ABNORMAL HIGH (ref 65–99)
Potassium: 4.7 mmol/L (ref 3.5–5.2)
Sodium: 134 mmol/L (ref 134–144)

## 2018-12-02 LAB — CBC
Hematocrit: 37.5 % (ref 34.0–46.6)
Hemoglobin: 12 g/dL (ref 11.1–15.9)
MCH: 28.8 pg (ref 26.6–33.0)
MCHC: 32 g/dL (ref 31.5–35.7)
MCV: 90 fL (ref 79–97)
Platelets: 438 10*3/uL (ref 150–450)
RBC: 4.16 x10E6/uL (ref 3.77–5.28)
RDW: 13.1 % (ref 11.7–15.4)
WBC: 5.2 10*3/uL (ref 3.4–10.8)

## 2018-12-02 LAB — VITAMIN B12: Vitamin B-12: 931 pg/mL (ref 232–1245)

## 2018-12-03 ENCOUNTER — Other Ambulatory Visit: Payer: Self-pay | Admitting: Internal Medicine

## 2018-12-03 LAB — URINE CULTURE

## 2018-12-03 MED ORDER — CEPHALEXIN 500 MG PO CAPS: 500.0000 mg | ORAL_CAPSULE | Freq: Two times a day (BID) | ORAL | 0 refills | Status: AC

## 2018-12-06 NOTE — Progress Notes (Signed)
Subjective:     Patient ID: Alyssa Ross , female    DOB: [birth year] , 53 y.o.   MRN: [id number]   Chief Complaint  Patient presents with  . BP check    HPI  She is here today for bp check. She scheduled appointment b/c she has noticed her bp has been elevated recently. She admits to compliance with her medications. She denies headaches, chest pain and shortness of breath.     Past Medical History:  Diagnosis Date  . ASCUS (atypical squamous cells of undetermined significance) on Pap smear 08/06/1999  . Breast mass in female 2002   Left  . Diabetes mellitus without complication ([place])   . Fibroid uterus 2010  . Hypertension   . Irregular bleeding 2011  . 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 Yeast vaginitis 2006     Family History  Problem Relation Age of Onset  . Diabetes Mother   . Dementia Mother   . Hyperlipidemia Mother   . Hypertension Mother   . Irregular heart beat Mother   . Diabetes Father   . Hypertension Father   . Hyperlipidemia Father   . Down syndrome Sister   . Diabetes Sister   . Hyperlipidemia Brother   . Heart Problems Brother      Current Outpatient Medications:  .  Cholecalciferol (VITAMIN D3) 5000 units CAPS, Take by mouth., Disp: , Rfl:  .  Continuous Blood Gluc Receiver (FREESTYLE LIBRE 14 DAY READER) DEVI, USE READER TO CHECK BLOOD GLUCOSE WITH FREESTYLE LIBRE SENSORS., Disp: 1 Device, Rfl: 0 .  Continuous Blood Gluc Sensor (FREESTYLE LIBRE 14 DAY SENSOR) MISC, APPLY SENSOR TO BODY ONCE EVERY 14 DAYS TO MONITOR BLOOD GLUCOSE, Disp: 4 each, Rfl: 3 .  glucose blood (ONE TOUCH ULTRA TEST) test strip, USE AS INSTRUCTED TO CHECK BLOOD SUGAR TWICE A DAY, Disp: 200 each, Rfl: 2 .  insulin degludec (TRESIBA FLEXTOUCH) 100 UNIT/ML SOPN FlexTouch Pen, INJECT 44 UNITS UNDER THE SKIN DAILY (Patient taking differently: 34 Units. INJECT 29 UNITS UNDER THE SKIN DAILY), Disp: 14 pen, Rfl: 3 .  insulin lispro (HUMALOG KWIKPEN) 100  UNIT/ML KiwkPen, Inject 5 units three times a day with meals., Disp: 15 mL, Rfl: 3 .  Insulin Pen Needle (NOVOFINE PLUS) 32G X 4 MM MISC, Use 3 per day to inject insulin, Disp: 300 each, Rfl: 1 .  lisinopril-hydrochlorothiazide (PRINZIDE,ZESTORETIC) 10-12.5 MG tablet, TAKE 1 TABLET DAILY, Disp: 90 tablet, Rfl: 4 .  metFORMIN (GLUCOPHAGE-XR) 750 MG 24 hr tablet, TAKE 3 TABLETS (2,250 MG TOTAL) DAILY, Disp: 270 tablet, Rfl: 2 .  ONETOUCH DELICA LANCETS 93T MISC, USE TO CHECK BLOOD SUGARS TWICE A DAY, Disp: 200 each, Rfl: 2 .  potassium chloride SA (K-DUR,KLOR-CON) 20 MEQ tablet, TAKE 1 TABLET DAILY, Disp: 90 tablet, Rfl: 4 .  rosuvastatin (CRESTOR) 20 MG tablet, TAKE 1 TABLET DAILY, Disp: 90 tablet, Rfl: 4 .  cephALEXin (KEFLEX) 500 MG capsule, Take 1 capsule (500 mg total) by mouth 2 (two) times daily for 14 doses., Disp: 14 capsule, Rfl: 0   Allergies  Allergen Reactions  . Cherry Rash  . Lemon Oil Rash     Review of Systems  Constitutional: Positive for fatigue.       She c/o worsening fatigue. She reports she has no energy. She is not sure what is contributing to her symptoms. 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 She admits this is stressful.   Respiratory: Negative.   Cardiovascular:  Negative.   Gastrointestinal: Negative.   Genitourinary:       She states her urine is a strange color. It appears orange to her. She denies dysuria.   Neurological: Negative.   Psychiatric/Behavioral: Negative.      Today's Vitals   12/01/18 1035  BP: 116/74  Pulse: 100  Temp: 98.8 F (37.1 C)  TempSrc: Oral  Weight: 221 lb 3.2 oz (100.3 kg)  Height: 5' 7.8" (1.722 m)   Body mass index is 33.83 kg/m.   Objective:  Physical Exam Vitals signs and nursing note reviewed.  Constitutional:      Appearance: Normal appearance.  HENT:     Head: Normocephalic and atraumatic.  Cardiovascular:     Rate and Rhythm: Normal rate and regular rhythm.     Heart sounds: Normal heart sounds.   Pulmonary:     Effort: Pulmonary effort is normal.     Breath sounds: Normal breath sounds.  Skin:    General: Skin is warm.  Neurological:     General: No focal deficit present.     Mental Status: She is alert.  Psychiatric:        Mood and Affect: Mood normal.        Behavior: Behavior normal.         Assessment And Plan:     1. Essential hypertension  Well controlled. She will continue with current meds. She is encouraged to avoid adding salt to her foods.   - POCT UA - Microalbumin  2. Fatigue, unspecified type  I will check labs as listed below. Importance of adequate hydration was discussed with the patient. I will make further recommendations once her labs are available for review.   - CBC no Diff - BMP8+EGFR  3. Abnormal urine color  Urinalysis performed, and is abnormal. I will check urine culture.   - POCT Urinalysis Dipstick (81002) - Culture, Urine  4. Anemia, unspecified type  Previous labs reviewed. I will check additional labs as listed below.   - Vitamin B12 - Iron and IBC ([id number])        Maximino Greenland, MD    THE PATIENT IS ENCOURAGED TO PRACTICE SOCIAL DISTANCING DUE TO THE COVID-19 PANDEMIC.

## 2018-12-07 ENCOUNTER — Encounter: Payer: Self-pay | Admitting: Internal Medicine

## 2018-12-08 ENCOUNTER — Other Ambulatory Visit: Payer: Self-pay | Admitting: Internal Medicine

## 2018-12-08 ENCOUNTER — Other Ambulatory Visit: Payer: BC Managed Care – PPO

## 2018-12-08 ENCOUNTER — Other Ambulatory Visit: Payer: Self-pay

## 2018-12-08 DIAGNOSIS — R17 Unspecified jaundice: Secondary | ICD-10-CM

## 2018-12-08 DIAGNOSIS — E119 Type 2 diabetes mellitus without complications: Secondary | ICD-10-CM | POA: Diagnosis not present

## 2018-12-08 DIAGNOSIS — R945 Abnormal results of liver function studies: Secondary | ICD-10-CM | POA: Diagnosis not present

## 2018-12-08 DIAGNOSIS — K769 Liver disease, unspecified: Secondary | ICD-10-CM | POA: Diagnosis not present

## 2018-12-08 DIAGNOSIS — H2513 Age-related nuclear cataract, bilateral: Secondary | ICD-10-CM | POA: Diagnosis not present

## 2018-12-08 LAB — HM DIABETES EYE EXAM

## 2018-12-09 ENCOUNTER — Other Ambulatory Visit: Payer: Self-pay | Admitting: Internal Medicine

## 2018-12-09 ENCOUNTER — Encounter: Payer: Self-pay | Admitting: Internal Medicine

## 2018-12-09 DIAGNOSIS — R748 Abnormal levels of other serum enzymes: Secondary | ICD-10-CM

## 2018-12-09 DIAGNOSIS — R17 Unspecified jaundice: Secondary | ICD-10-CM

## 2018-12-09 LAB — HEPATITIS PANEL, ACUTE
Hep A IgM: NEGATIVE
Hep B C IgM: NEGATIVE
Hep C Virus Ab: <0.1 s/co ratio (ref 0.0–0.9)
Hepatitis B Surface Ag: NEGATIVE

## 2018-12-09 LAB — HEPATIC FUNCTION PANEL
ALT: 548 IU/L (ref 0–32)
ALT: 668 IU/L (ref 0–32)
AST: 267 IU/L — ABNORMAL HIGH (ref 0–40)
AST: 312 IU/L — ABNORMAL HIGH (ref 0–40)
Albumin: 4.3 g/dL (ref 3.8–4.9)
Albumin: 4.4 g/dL (ref 3.8–4.9)
Alkaline Phosphatase: 879 IU/L — ABNORMAL HIGH (ref 39–117)
Alkaline Phosphatase: 1079 IU/L (ref 39–117)
Bilirubin Total: 3.3 mg/dL — ABNORMAL HIGH (ref 0.0–1.2)
Bilirubin Total: 9.9 mg/dL — ABNORMAL HIGH (ref 0.0–1.2)
Bilirubin, Direct: 2.54 mg/dL — ABNORMAL HIGH (ref 0.00–0.40)
Bilirubin, Direct: 8.45 mg/dL — ABNORMAL HIGH (ref 0.00–0.40)
Total Protein: 7 g/dL (ref 6.0–8.5)
Total Protein: 7.5 g/dL (ref 6.0–8.5)

## 2018-12-09 LAB — GAMMA GT: GGT: 2144 IU/L (ref 0–60)

## 2018-12-09 LAB — SPECIMEN STATUS REPORT

## 2018-12-10 ENCOUNTER — Telehealth: Payer: Self-pay | Admitting: Internal Medicine

## 2018-12-10 DIAGNOSIS — D259 Leiomyoma of uterus, unspecified: Secondary | ICD-10-CM | POA: Diagnosis not present

## 2018-12-10 DIAGNOSIS — N281 Cyst of kidney, acquired: Secondary | ICD-10-CM | POA: Diagnosis not present

## 2018-12-10 DIAGNOSIS — K828 Other specified diseases of gallbladder: Secondary | ICD-10-CM | POA: Diagnosis not present

## 2018-12-10 DIAGNOSIS — K7689 Other specified diseases of liver: Secondary | ICD-10-CM | POA: Diagnosis not present

## 2018-12-10 DIAGNOSIS — K8689 Other specified diseases of pancreas: Secondary | ICD-10-CM | POA: Diagnosis not present

## 2018-12-10 DIAGNOSIS — K838 Other specified diseases of biliary tract: Secondary | ICD-10-CM | POA: Diagnosis not present

## 2018-12-10 NOTE — Telephone Encounter (Signed)
Pt advised of abnormal u/s results: renal cyst, enlarged CBD, ? Pancreatic mass. Needs to have MRI pancreatic protocol to evaluate pancreas and kidneys. Additionally, I will add on some labs to blood drawn this week. If not enough blood drawn, she agrees to come in Monday for labwork.

## 2018-12-14 ENCOUNTER — Encounter: Payer: Self-pay | Admitting: Internal Medicine

## 2018-12-14 ENCOUNTER — Other Ambulatory Visit: Payer: Self-pay | Admitting: Internal Medicine

## 2018-12-14 LAB — LIPASE: Lipase: 1370 U/L — ABNORMAL HIGH (ref 14–72)

## 2018-12-14 LAB — MITOCHONDRIAL ANTIBODIES: Mitochondrial Ab: <20 Units (ref 0.0–20.0)

## 2018-12-14 LAB — ANA W/REFLEX: Anti Nuclear Antibody (ANA): NEGATIVE

## 2018-12-14 LAB — SPECIMEN STATUS REPORT

## 2018-12-14 LAB — ANTI-SMOOTH MUSCLE ANTIBODY, IGG: Smooth Muscle Ab: 11 Units (ref 0–19)

## 2018-12-14 MED ORDER — HYDROXYZINE HCL 10 MG PO TABS: 10.0000 mg | ORAL_TABLET | Freq: Three times a day (TID) | ORAL | 0 refills | Status: DC | PRN

## 2018-12-16 ENCOUNTER — Other Ambulatory Visit: Payer: Self-pay

## 2018-12-16 MED ORDER — INSULIN LISPRO (1 UNIT DIAL) 100 UNIT/ML (KWIKPEN): PEN_INJECTOR | SUBCUTANEOUS | 3 refills | Status: DC

## 2018-12-16 MED ORDER — TRESIBA FLEXTOUCH 100 UNIT/ML ~~LOC~~ SOPN: PEN_INJECTOR | SUBCUTANEOUS | 3 refills | Status: DC

## 2018-12-17 ENCOUNTER — Encounter: Payer: Self-pay | Admitting: Internal Medicine

## 2018-12-18 ENCOUNTER — Other Ambulatory Visit: Payer: BC Managed Care – PPO

## 2018-12-18 DIAGNOSIS — K8689 Other specified diseases of pancreas: Secondary | ICD-10-CM | POA: Diagnosis not present

## 2018-12-18 DIAGNOSIS — E278 Other specified disorders of adrenal gland: Secondary | ICD-10-CM | POA: Diagnosis not present

## 2018-12-18 DIAGNOSIS — R935 Abnormal findings on diagnostic imaging of other abdominal regions, including retroperitoneum: Secondary | ICD-10-CM | POA: Diagnosis not present

## 2018-12-18 DIAGNOSIS — N281 Cyst of kidney, acquired: Secondary | ICD-10-CM | POA: Diagnosis not present

## 2018-12-21 ENCOUNTER — Telehealth: Payer: Self-pay | Admitting: Nurse Practitioner

## 2018-12-21 ENCOUNTER — Other Ambulatory Visit: Payer: Self-pay | Admitting: Nurse Practitioner

## 2018-12-21 ENCOUNTER — Encounter: Payer: Self-pay | Admitting: Internal Medicine

## 2018-12-21 DIAGNOSIS — K8689 Other specified diseases of pancreas: Secondary | ICD-10-CM

## 2018-12-21 NOTE — Telephone Encounter (Signed)
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 Collene Mares for further evaluation with ERCP.

## 2018-12-22 ENCOUNTER — Encounter: Payer: Self-pay | Admitting: Internal Medicine

## 2018-12-23 ENCOUNTER — Other Ambulatory Visit (HOSPITAL_COMMUNITY)
Admission: RE | Admit: 2018-12-23 | Discharge: 2018-12-23 | Disposition: A | Discharge status: Home / Self Care | Payer: BC Managed Care – PPO | Source: Ambulatory Visit | Attending: Gastroenterology | Admitting: Gastroenterology

## 2018-12-23 ENCOUNTER — Encounter: Payer: Self-pay | Admitting: Internal Medicine

## 2018-12-23 ENCOUNTER — Other Ambulatory Visit: Payer: Self-pay | Admitting: Gastroenterology

## 2018-12-23 DIAGNOSIS — R740 Nonspecific elevation of levels of transaminase and lactic acid dehydrogenase [LDH]: Secondary | ICD-10-CM | POA: Diagnosis not present

## 2018-12-23 DIAGNOSIS — Z1159 Encounter for screening for other viral diseases: Secondary | ICD-10-CM | POA: Diagnosis not present

## 2018-12-23 DIAGNOSIS — C250 Malignant neoplasm of head of pancreas: Secondary | ICD-10-CM | POA: Diagnosis not present

## 2018-12-23 DIAGNOSIS — R5383 Other fatigue: Secondary | ICD-10-CM | POA: Diagnosis not present

## 2018-12-23 DIAGNOSIS — R17 Unspecified jaundice: Secondary | ICD-10-CM | POA: Diagnosis not present

## 2018-12-23 LAB — SARS CORONAVIRUS 2 (TAT 6-24 HRS): SARS Coronavirus 2: NEGATIVE

## 2018-12-25 ENCOUNTER — Encounter (HOSPITAL_COMMUNITY)
Admission: RE | Disposition: A | Discharge status: Home / Self Care | Payer: Self-pay | Source: Home / Self Care | Attending: Gastroenterology

## 2018-12-25 ENCOUNTER — Ambulatory Visit (HOSPITAL_COMMUNITY): Payer: BC Managed Care – PPO | Admitting: Certified Registered"

## 2018-12-25 ENCOUNTER — Ambulatory Visit (HOSPITAL_COMMUNITY)
Admission: RE | Admit: 2018-12-25 | Discharge: 2018-12-25 | Disposition: A | Discharge status: Home / Self Care | Payer: BC Managed Care – PPO | Attending: Gastroenterology | Admitting: Gastroenterology

## 2018-12-25 ENCOUNTER — Ambulatory Visit (HOSPITAL_COMMUNITY): Payer: BC Managed Care – PPO

## 2018-12-25 ENCOUNTER — Encounter: Payer: Self-pay | Admitting: Internal Medicine

## 2018-12-25 ENCOUNTER — Encounter (HOSPITAL_COMMUNITY): Payer: Self-pay | Admitting: Gastroenterology

## 2018-12-25 ENCOUNTER — Other Ambulatory Visit: Payer: Self-pay

## 2018-12-25 DIAGNOSIS — Z91018 Allergy to other foods: Secondary | ICD-10-CM | POA: Diagnosis not present

## 2018-12-25 DIAGNOSIS — E119 Type 2 diabetes mellitus without complications: Secondary | ICD-10-CM | POA: Diagnosis not present

## 2018-12-25 DIAGNOSIS — R17 Unspecified jaundice: Secondary | ICD-10-CM

## 2018-12-25 DIAGNOSIS — C250 Malignant neoplasm of head of pancreas: Secondary | ICD-10-CM | POA: Diagnosis not present

## 2018-12-25 DIAGNOSIS — Z818 Family history of other mental and behavioral disorders: Secondary | ICD-10-CM | POA: Insufficient documentation

## 2018-12-25 DIAGNOSIS — Z8249 Family history of ischemic heart disease and other diseases of the circulatory system: Secondary | ICD-10-CM | POA: Diagnosis not present

## 2018-12-25 DIAGNOSIS — D490 Neoplasm of unspecified behavior of digestive system: Secondary | ICD-10-CM | POA: Diagnosis not present

## 2018-12-25 DIAGNOSIS — Z833 Family history of diabetes mellitus: Secondary | ICD-10-CM | POA: Diagnosis not present

## 2018-12-25 DIAGNOSIS — I10 Essential (primary) hypertension: Secondary | ICD-10-CM | POA: Diagnosis not present

## 2018-12-25 DIAGNOSIS — K8689 Other specified diseases of pancreas: Secondary | ICD-10-CM | POA: Diagnosis not present

## 2018-12-25 DIAGNOSIS — Z820 Family history of epilepsy and other diseases of the nervous system: Secondary | ICD-10-CM | POA: Insufficient documentation

## 2018-12-25 DIAGNOSIS — K831 Obstruction of bile duct: Secondary | ICD-10-CM | POA: Diagnosis not present

## 2018-12-25 DIAGNOSIS — K869 Disease of pancreas, unspecified: Secondary | ICD-10-CM | POA: Diagnosis not present

## 2018-12-25 HISTORY — PX: ERCP: SHX60

## 2018-12-25 HISTORY — PX: SPHINCTEROTOMY: SHX5544

## 2018-12-25 HISTORY — PX: ENDOSCOPIC RETROGRADE CHOLANGIOPANCREATOGRAPHY (ERCP) WITH PROPOFOL: SHX5810

## 2018-12-25 HISTORY — PX: BILIARY STENT PLACEMENT: SHX5538

## 2018-12-25 LAB — GLUCOSE, CAPILLARY: Glucose-Capillary: 233 mg/dL — ABNORMAL HIGH (ref 70–99)

## 2018-12-25 SURGERY — ENDOSCOPIC RETROGRADE CHOLANGIOPANCREATOGRAPHY (ERCP) WITH PROPOFOL
Anesthesia: General

## 2018-12-25 MED ORDER — SODIUM CHLORIDE 0.9 % IV SOLN
INTRAVENOUS | Status: DC | PRN
  Administered 2018-12-25: 20 ug/min via INTRAVENOUS

## 2018-12-25 MED ORDER — FENTANYL CITRATE (PF) 250 MCG/5ML IJ SOLN
INTRAMUSCULAR | Status: DC | PRN
  Administered 2018-12-25: 100 ug via INTRAVENOUS

## 2018-12-25 MED ORDER — LACTATED RINGERS IV SOLN
INTRAVENOUS | Status: DC
  Administered 2018-12-25: 12:00:00 via INTRAVENOUS

## 2018-12-25 MED ORDER — ROCURONIUM BROMIDE 10 MG/ML (PF) SYRINGE
PREFILLED_SYRINGE | INTRAVENOUS | Status: DC | PRN
  Administered 2018-12-25: 30 mg via INTRAVENOUS

## 2018-12-25 MED ORDER — DEXAMETHASONE SODIUM PHOSPHATE 10 MG/ML IJ SOLN
INTRAMUSCULAR | Status: DC | PRN
  Administered 2018-12-25: 4 mg via INTRAVENOUS

## 2018-12-25 MED ORDER — PROPOFOL 10 MG/ML IV BOLUS
INTRAVENOUS | Status: DC | PRN
  Administered 2018-12-25: 200 mg via INTRAVENOUS

## 2018-12-25 MED ORDER — LIDOCAINE 2% (20 MG/ML) 5 ML SYRINGE
INTRAMUSCULAR | Status: DC | PRN
  Administered 2018-12-25: 60 mg via INTRAVENOUS

## 2018-12-25 MED ORDER — CIPROFLOXACIN IN D5W 400 MG/200ML IV SOLN
INTRAVENOUS | Status: AC
  Filled 2018-12-25: qty 200

## 2018-12-25 MED ORDER — FENTANYL CITRATE (PF) 100 MCG/2ML IJ SOLN
INTRAMUSCULAR | Status: AC
  Filled 2018-12-25: qty 2

## 2018-12-25 MED ORDER — SUGAMMADEX SODIUM 200 MG/2ML IV SOLN
INTRAVENOUS | Status: DC | PRN
  Administered 2018-12-25: 200 mg via INTRAVENOUS

## 2018-12-25 MED ORDER — ONDANSETRON HCL 4 MG/2ML IJ SOLN
INTRAMUSCULAR | Status: DC | PRN
  Administered 2018-12-25: 4 mg via INTRAVENOUS

## 2018-12-25 MED ORDER — PHENYLEPHRINE 40 MCG/ML (10ML) SYRINGE FOR IV PUSH (FOR BLOOD PRESSURE SUPPORT)
PREFILLED_SYRINGE | INTRAVENOUS | Status: DC | PRN
  Administered 2018-12-25 (×6): 80 ug via INTRAVENOUS

## 2018-12-25 MED ORDER — MIDAZOLAM HCL 2 MG/2ML IJ SOLN
INTRAMUSCULAR | Status: DC | PRN
  Administered 2018-12-25: 2 mg via INTRAVENOUS

## 2018-12-25 MED ORDER — GLUCAGON HCL RDNA (DIAGNOSTIC) 1 MG IJ SOLR
INTRAMUSCULAR | Status: AC
  Filled 2018-12-25: qty 1

## 2018-12-25 MED ORDER — SUCCINYLCHOLINE CHLORIDE 200 MG/10ML IV SOSY
PREFILLED_SYRINGE | INTRAVENOUS | Status: DC | PRN
  Administered 2018-12-25: 120 mg via INTRAVENOUS

## 2018-12-25 MED ORDER — INDOMETHACIN 50 MG RE SUPP
RECTAL | Status: DC | PRN
  Administered 2018-12-25: 100 mg via RECTAL

## 2018-12-25 MED ORDER — MIDAZOLAM HCL 2 MG/2ML IJ SOLN
INTRAMUSCULAR | Status: AC
  Filled 2018-12-25: qty 2

## 2018-12-25 MED ORDER — SODIUM CHLORIDE 0.9 % IV SOLN: INTRAVENOUS | Status: DC

## 2018-12-25 MED ORDER — INDOMETHACIN 50 MG RE SUPP
RECTAL | Status: AC
  Filled 2018-12-25: qty 2

## 2018-12-25 MED ORDER — CIPROFLOXACIN IN D5W 400 MG/200ML IV SOLN
INTRAVENOUS | Status: DC | PRN
  Administered 2018-12-25: 400 mg via INTRAVENOUS

## 2018-12-25 NOTE — Anesthesia Procedure Notes (Signed)
Procedure Name: Intubation Date/Time: 12/25/2018 11:49 AM Performed by: Eben Burow, CRNA Pre-anesthesia Checklist: Patient identified, Emergency Drugs available, Suction available, Patient being monitored and Timeout performed Patient Re-evaluated:Patient Re-evaluated prior to induction Oxygen Delivery Method: Circle system utilized Preoxygenation: Pre-oxygenation with 100% oxygen Induction Type: IV induction and Rapid sequence Laryngoscope Size: Mac and 4 Grade View: Grade I Tube type: Oral Tube size: 7.0 mm Number of attempts: 1 Airway Equipment and Method: Stylet Placement Confirmation: ETT inserted through vocal cords under direct vision,  positive ETCO2 and breath sounds checked- equal and bilateral Secured at: 22 cm Tube secured with: Tape Dental Injury: Teeth and Oropharynx as per pre-operative assessment

## 2018-12-25 NOTE — Discharge Instructions (Signed)

## 2018-12-25 NOTE — Anesthesia Preprocedure Evaluation (Addendum)
Anesthesia Evaluation  Patient identified by MRN, date of birth, ID band Patient awake    Reviewed: Allergy & Precautions, NPO status , Patient's Chart, lab work & pertinent test results  History of Anesthesia Complications Negative for: history of anesthetic complications  Airway Mallampati: II  TM Distance: >3 FB Neck ROM: Full    Dental no notable dental hx.    Pulmonary neg pulmonary ROS,    Pulmonary exam normal        Cardiovascular hypertension, Pt. on medications negative cardio ROS Normal cardiovascular exam     Neuro/Psych negative neurological ROS     GI/Hepatic Neg liver ROS, Pancreatic mass   Endo/Other  diabetes, Type 2, Insulin Dependent, Oral Hypoglycemic Agents  Renal/GU negative Renal ROS     Musculoskeletal negative musculoskeletal ROS (+)   Abdominal   Peds  Hematology negative hematology ROS (+)   Anesthesia Other Findings Day of surgery medications reviewed with the patient.  Reproductive/Obstetrics                            Anesthesia Physical Anesthesia Plan  ASA: III  Anesthesia Plan: General   Post-op Pain Management:    Induction: Intravenous  PONV Risk Score and Plan: 3 and Treatment may vary due to age or medical condition, Ondansetron, Dexamethasone and Midazolam  Airway Management Planned: Oral ETT  Additional Equipment:   Intra-op Plan:   Post-operative Plan: Extubation in OR  Informed Consent: I have reviewed the patients History and Physical, chart, labs and discussed the procedure including the risks, benefits and alternatives for the proposed anesthesia with the patient or authorized representative who has indicated his/her understanding and acceptance.     Dental advisory given  Plan Discussed with: CRNA  Anesthesia Plan Comments:        Anesthesia Quick Evaluation

## 2018-12-25 NOTE — Op Note (Signed)
[facility] Patient Name: Alyssa Ross Procedure Date: 12/25/2018 MRN: [id number] Attending MD: Carol Ada , MD Date of Birth: 07-May-1966 CSN: [id number] Age: 53 Admit Type: Outpatient Procedure:                ERCP Indications:              Malignant stricture of the common bile duct Providers:                Carol Ada, MD, Baird Cancer, RN, Ashley Jacobs,                            RN, Cletis Athens, Technician Referring MD:              Medicines:                General Anesthesia Complications:            No immediate complications. Estimated Blood Loss:     Estimated blood loss: none. Procedure:                Pre-Anesthesia Assessment:                           - Prior to the procedure, a History and Physical                            was performed, and patient medications and                            allergies were reviewed. The patient's tolerance of                            previous anesthesia was also reviewed. The risks                            and benefits of the procedure and the sedation                            options and risks were discussed with the patient.                            All questions were answered, and informed consent                            was obtained. Prior Anticoagulants: The patient has                            taken no previous anticoagulant or antiplatelet                            agents. ASA Grade Assessment: II - A patient with                            mild systemic disease. After reviewing the risks  and benefits, the patient was deemed in                            satisfactory condition to undergo the procedure.                           - Sedation was administered by an anesthesia                            professional. General anesthesia was attained.                           After obtaining informed consent, the scope was                            passed under direct  vision. Throughout the                            procedure, the patient's blood pressure, pulse, and                            oxygen saturations were monitored continuously. The                            [id number]-Q180V ([id number] Olympus duodenoscope was                            introduced through the mouth, and used to inject                            contrast into and used to inject contrast into the                            bile duct. The ERCP was accomplished without                            difficulty. The patient tolerated the procedure                            well. Scope In: Scope Out: Findings:      The major papilla was normal. The bile duct was deeply cannulated with       the short-nosed traction sphincterotome. Contrast was injected. I       personally interpreted the bile duct images. There was brisk flow of       contrast through the ducts. Image quality was excellent. Contrast       extended to the entire biliary tree. The lower third of the main bile       duct contained a single segmental stenosis 4 cm mm in length. A short       0.035 inch Soft Jagwire was passed into the biliary tree. An 8 mm       biliary sphincterotomy was made with a braided traction (standard)       sphincterotome using ERBE electrocautery. There was no       post-sphincterotomy bleeding. One 8.5  Fr by 5 cm plastic stent with a       single external flap and a single internal flap was placed 4.5 cm into       the common bile duct. Bile flowed through the stent. The stent was in       good position. Impression:               - The major papilla appeared normal.                           - A single segmental biliary stricture was found in                            the lower third of the main bile duct. The                            stricture was secondary to extrinsic compression                            for the pancreatic head mass.                           - A biliary sphincterotomy  was performed.                           - One plastic stent was placed into the common bile                            duct. Moderate Sedation:      Not Applicable - Patient had care per Anesthesia. Recommendation:           - Patient has a contact number available for                            emergencies. The signs and symptoms of potential                            delayed complications were discussed with the                            patient. Return to normal activities tomorrow.                            Written discharge instructions were provided to the                            patient.                           - Resume regular diet.                           - Schedule EUS with FNA next week.                           - Cipro 500 mg BID Procedure Code(s):        ---  Professional ---                           [phone], Endoscopic retrograde                            cholangiopancreatography (ERCP); with placement of                            endoscopic stent into biliary or pancreatic duct,                            including pre- and post-dilation and guide wire                            passage, when performed, including sphincterotomy,                            when performed, each stent Diagnosis Code(s):        --- Professional ---                           K83.1, Obstruction of bile duct CPT copyright 2019 American [employer] Association. All rights reserved. The codes documented in this report are preliminary and upon coder review may  be revised to meet current compliance requirements. Carol Ada, MD Carol Ada, MD 12/25/2018 12:30:04 PM This report has been signed electronically. Number of Addenda: 0

## 2018-12-25 NOTE — Transfer of Care (Signed)
Immediate Anesthesia Transfer of Care Note  Patient: [name]  Procedure(s) Performed: ENDOSCOPIC RETROGRADE CHOLANGIOPANCREATOGRAPHY (ERCP) WITH PROPOFOL (N/A ) SPHINCTEROTOMY BILIARY STENT PLACEMENT (N/A )  Patient Location: PACU and Endoscopy Unit  Anesthesia Type:General  Level of Consciousness: awake, alert  and oriented  Airway & Oxygen Therapy: Patient Spontanous Breathing and Patient connected to face mask oxygen  Post-op Assessment: Report given to RN and Post -op Vital signs reviewed and stable  Post vital signs: Reviewed and stable  Last Vitals:  Vitals Value Taken Time  BP 132/84 12/25/18 1232  Temp 36.8 C 12/25/18 1232  Pulse 92 12/25/18 1234  Resp 21 12/25/18 1234  SpO2 100 % 12/25/18 1234  Vitals shown include unvalidated device data.  Last Pain:  Vitals:   12/25/18 1232  TempSrc: Temporal  PainSc: 0-No pain         Complications: No apparent anesthesia complications

## 2018-12-25 NOTE — Anesthesia Postprocedure Evaluation (Signed)
Anesthesia Post Note  Patient: Ravenna Legore  Procedure(s) Performed: ENDOSCOPIC RETROGRADE CHOLANGIOPANCREATOGRAPHY (ERCP) WITH PROPOFOL (N/A ) SPHINCTEROTOMY BILIARY STENT PLACEMENT (N/A )     Patient location during evaluation: PACU Anesthesia Type: General Level of consciousness: awake and alert Pain management: pain level controlled Vital Signs Assessment: post-procedure vital signs reviewed and stable Respiratory status: spontaneous breathing, nonlabored ventilation and respiratory function stable Cardiovascular status: blood pressure returned to baseline and stable Postop Assessment: no apparent nausea or vomiting Anesthetic complications: no    Last Vitals:  Vitals:   12/25/18 1240 12/25/18 1300  BP: 132/85 (!) 141/86  Pulse: 93 85  Resp: 16 16  Temp:    SpO2: 100% 96%    Last Pain:  Vitals:   12/25/18 1240  TempSrc:   PainSc: 0-No pain                 Brennan Bailey

## 2018-12-25 NOTE — H&P (View-Only) (Signed)
  Alyssa Ross HPI: The patient reports a 3 week history of jaundice.  Further work up shows a 2.5 cm mass in the head of the pancreas and a double duct sign.  Her liver enzymes are markedly abnormal.  Past Medical History:  Diagnosis Date  . ASCUS (atypical squamous cells of undetermined significance) on Pap smear 08/06/1999  . Breast mass in female 2002   Left  . Diabetes mellitus without complication ([place])   . Fibroid uterus 2010  . Hypertension   . Irregular bleeding 2011  . 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 Yeast vaginitis 2006    Past Surgical History:  Procedure Laterality Date  . DILATATION & CURETTAGE/HYSTEROSCOPY WITH TRUECLEAR N/A 01/20/2014   Procedure: DILATATION & CURETTAGE/HYSTEROSCOPY WITH TRUCLEAR;  Surgeon: [name], MD;  Location: [facility];  Service: Gynecology;  Laterality: N/A;  . DILATATION & CURRETTAGE/HYSTEROSCOPY WITH RESECTOCOPE N/A 01/20/2014   Procedure: [place];  Surgeon: [name], MD;  Location: [facility];  Service: Gynecology;  Laterality: N/A;  . NO PAST SURGERIES      Family History  Problem Relation Age of Onset  . Diabetes Mother   . Dementia Mother   . Hyperlipidemia Mother   . Hypertension Mother   . Irregular heart beat Mother   . Diabetes Father   . Hypertension Father   . Hyperlipidemia Father   . Down syndrome Sister   . Diabetes Sister   . Hyperlipidemia Brother   . Heart Problems Brother     Social History:  reports that she has never smoked. She has never used smokeless tobacco. She reports current alcohol use. She reports that she does not use drugs.  Allergies:  Allergies  Allergen Reactions  . Cherry Rash  . Lemon Oil Rash    Medications:  Scheduled:  Continuous: . lactated ringers 10 mL/hr at 12/25/18 1130    Results for orders placed or performed during the hospital encounter of 12/25/18 (from the past 24 hour(s))  Glucose,  capillary     Status: Abnormal   Collection Time: 12/25/18 11:29 AM  Result Value Ref Range   Glucose-Capillary 233 (H) 70 - 99 mg/dL     No results found.  ROS:  As stated above in the HPI otherwise negative.  Blood pressure 99/63, pulse (!) 108, temperature (!) 97.5 F (36.4 C), temperature source Oral, resp. rate 13, height 5\' 8"  (1.727 m), weight 93 kg, SpO2 99 %.    PE: Gen: NAD, Alert and Oriented HEENT:  [place]/AT, EOMI, scleral icterus Neck: Supple, no LAD Lungs: CTA Bilaterally CV: RRR without M/G/R ABM: Soft, NTND, +BS Ext: No C/C/E  Assessment/Plan: 1) Pancreatic mass. 2) Jaundice.  Plan: 1) ERCP with stent placement.  Rudolph Dobler D 12/25/2018, 11:32 AM

## 2018-12-25 NOTE — H&P (Signed)
  Alyssa Ross HPI: The patient reports a 3 week history of jaundice.  Further work up shows a 2.5 cm mass in the head of the pancreas and a double duct sign.  Her liver enzymes are markedly abnormal.  Past Medical History:  Diagnosis Date  . ASCUS (atypical squamous cells of undetermined significance) on Pap smear 08/06/1999  . Breast mass in female 2002   Left  . Diabetes mellitus without complication (Alyssa Ross)   . Fibroid uterus 2010  . Hypertension   . Irregular bleeding 2011  . 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 Yeast vaginitis 2006    Past Surgical History:  Procedure Laterality Date  . DILATATION & CURETTAGE/HYSTEROSCOPY WITH TRUECLEAR N/A 01/20/2014   Procedure: DILATATION & CURETTAGE/HYSTEROSCOPY WITH TRUCLEAR;  Surgeon: Alyssa Coder, MD;  Location: [facility];  Service: Gynecology;  Laterality: N/A;  . DILATATION & CURRETTAGE/HYSTEROSCOPY WITH RESECTOCOPE N/A 01/20/2014   Procedure: [place];  Surgeon: Alyssa Coder, MD;  Location: [facility];  Service: Gynecology;  Laterality: N/A;  . NO PAST SURGERIES      Family History  Problem Relation Age of Onset  . Diabetes Mother   . Dementia Mother   . Hyperlipidemia Mother   . Hypertension Mother   . Irregular heart beat Mother   . Diabetes Father   . Hypertension Father   . Hyperlipidemia Father   . Down syndrome Sister   . Diabetes Sister   . Hyperlipidemia Brother   . Heart Problems Brother     Social History:  reports that she has never smoked. She has never used smokeless tobacco. She reports current alcohol use. She reports that she does not use drugs.  Allergies:  Allergies  Allergen Reactions  . Cherry Rash  . Lemon Oil Rash    Medications:  Scheduled:  Continuous: . lactated ringers 10 mL/hr at 12/25/18 1130    Results for orders placed or performed during the hospital encounter of 12/25/18 (from the past 24 hour(s))  Glucose,  capillary     Status: Abnormal   Collection Time: 12/25/18 11:29 AM  Result Value Ref Range   Glucose-Capillary 233 (H) 70 - 99 mg/dL     No results found.  ROS:  As stated above in the HPI otherwise negative.  Blood pressure 99/63, pulse (!) 108, temperature (!) 97.5 F (36.4 C), temperature source Oral, resp. rate 13, height 5\' 8"  (1.727 m), weight 93 kg, SpO2 99 %.    PE: Gen: NAD, Alert and Oriented HEENT:  [place]/AT, EOMI, scleral icterus Neck: Supple, no LAD Lungs: CTA Bilaterally CV: RRR without M/G/R ABM: Soft, NTND, +BS Ext: No C/C/E  Assessment/Plan: 1) Pancreatic mass. 2) Jaundice.  Plan: 1) ERCP with stent placement.  Alyssa Ross D 12/25/2018, 11:32 AM

## 2018-12-26 ENCOUNTER — Encounter: Payer: Self-pay | Admitting: Internal Medicine

## 2018-12-28 ENCOUNTER — Other Ambulatory Visit: Payer: Self-pay | Admitting: Gastroenterology

## 2018-12-30 ENCOUNTER — Other Ambulatory Visit: Payer: Self-pay

## 2018-12-30 ENCOUNTER — Encounter (HOSPITAL_COMMUNITY): Payer: Self-pay | Admitting: *Deleted

## 2018-12-30 ENCOUNTER — Other Ambulatory Visit (HOSPITAL_COMMUNITY)
Admission: RE | Admit: 2018-12-30 | Discharge: 2018-12-30 | Disposition: A | Discharge status: Home / Self Care | Payer: BC Managed Care – PPO | Source: Ambulatory Visit | Attending: Gastroenterology | Admitting: Gastroenterology

## 2018-12-30 DIAGNOSIS — Z1159 Encounter for screening for other viral diseases: Secondary | ICD-10-CM | POA: Diagnosis not present

## 2018-12-30 LAB — SARS CORONAVIRUS 2 (TAT 6-24 HRS): SARS Coronavirus 2: NEGATIVE

## 2019-01-01 ENCOUNTER — Encounter (HOSPITAL_COMMUNITY): Payer: Self-pay | Admitting: *Deleted

## 2019-01-01 ENCOUNTER — Ambulatory Visit (HOSPITAL_COMMUNITY): Payer: BC Managed Care – PPO | Admitting: Registered Nurse

## 2019-01-01 ENCOUNTER — Encounter (HOSPITAL_COMMUNITY)
Admission: RE | Disposition: A | Discharge status: Home / Self Care | Payer: Self-pay | Source: Home / Self Care | Attending: Gastroenterology

## 2019-01-01 ENCOUNTER — Other Ambulatory Visit: Payer: Self-pay

## 2019-01-01 ENCOUNTER — Ambulatory Visit (HOSPITAL_COMMUNITY)
Admission: RE | Admit: 2019-01-01 | Discharge: 2019-01-01 | Disposition: A | Discharge status: Home / Self Care | Payer: BC Managed Care – PPO | Attending: Gastroenterology | Admitting: Gastroenterology

## 2019-01-01 ENCOUNTER — Ambulatory Visit (HOSPITAL_COMMUNITY): Payer: BC Managed Care – PPO

## 2019-01-01 DIAGNOSIS — C250 Malignant neoplasm of head of pancreas: Secondary | ICD-10-CM | POA: Diagnosis not present

## 2019-01-01 DIAGNOSIS — K869 Disease of pancreas, unspecified: Secondary | ICD-10-CM | POA: Insufficient documentation

## 2019-01-01 DIAGNOSIS — Z794 Long term (current) use of insulin: Secondary | ICD-10-CM | POA: Diagnosis not present

## 2019-01-01 DIAGNOSIS — E119 Type 2 diabetes mellitus without complications: Secondary | ICD-10-CM | POA: Diagnosis not present

## 2019-01-01 DIAGNOSIS — K831 Obstruction of bile duct: Secondary | ICD-10-CM | POA: Insufficient documentation

## 2019-01-01 DIAGNOSIS — I10 Essential (primary) hypertension: Secondary | ICD-10-CM | POA: Insufficient documentation

## 2019-01-01 DIAGNOSIS — D490 Neoplasm of unspecified behavior of digestive system: Secondary | ICD-10-CM | POA: Diagnosis not present

## 2019-01-01 DIAGNOSIS — K8689 Other specified diseases of pancreas: Secondary | ICD-10-CM

## 2019-01-01 HISTORY — PX: ENDOSCOPIC RETROGRADE CHOLANGIOPANCREATOGRAPHY (ERCP) WITH PROPOFOL: SHX5810

## 2019-01-01 HISTORY — PX: FINE NEEDLE ASPIRATION: SHX5430

## 2019-01-01 HISTORY — DX: Nausea with vomiting, unspecified: R11.2

## 2019-01-01 HISTORY — PX: STENT REMOVAL: SHX6421

## 2019-01-01 HISTORY — PX: UPPER ESOPHAGEAL ENDOSCOPIC ULTRASOUND (EUS): SHX6562

## 2019-01-01 HISTORY — DX: Other specified postprocedural states: Z98.890

## 2019-01-01 HISTORY — PX: BILIARY STENT PLACEMENT: SHX5538

## 2019-01-01 LAB — GLUCOSE, CAPILLARY: Glucose-Capillary: 203 mg/dL — ABNORMAL HIGH (ref 70–99)

## 2019-01-01 SURGERY — ENDOSCOPIC RETROGRADE CHOLANGIOPANCREATOGRAPHY (ERCP) WITH PROPOFOL
Anesthesia: General

## 2019-01-01 MED ORDER — PROPOFOL 10 MG/ML IV BOLUS
INTRAVENOUS | Status: DC | PRN
  Administered 2019-01-01: 180 mg via INTRAVENOUS

## 2019-01-01 MED ORDER — FENTANYL CITRATE (PF) 100 MCG/2ML IJ SOLN
INTRAMUSCULAR | Status: DC | PRN
  Administered 2019-01-01 (×2): 50 ug via INTRAVENOUS
  Administered 2019-01-01: 25 ug via INTRAVENOUS

## 2019-01-01 MED ORDER — FENTANYL CITRATE (PF) 100 MCG/2ML IJ SOLN
INTRAMUSCULAR | Status: AC
  Filled 2019-01-01: qty 2

## 2019-01-01 MED ORDER — SODIUM CHLORIDE 0.9 % IV SOLN: INTRAVENOUS | Status: DC

## 2019-01-01 MED ORDER — INDOMETHACIN 50 MG RE SUPP
RECTAL | Status: DC | PRN
  Administered 2019-01-01: 100 mg via RECTAL

## 2019-01-01 MED ORDER — SUCCINYLCHOLINE CHLORIDE 200 MG/10ML IV SOSY
PREFILLED_SYRINGE | INTRAVENOUS | Status: DC | PRN
  Administered 2019-01-01: 100 mg via INTRAVENOUS

## 2019-01-01 MED ORDER — LIDOCAINE 2% (20 MG/ML) 5 ML SYRINGE
INTRAMUSCULAR | Status: DC | PRN
  Administered 2019-01-01: 80 mg via INTRAVENOUS

## 2019-01-01 MED ORDER — CIPROFLOXACIN IN D5W 400 MG/200ML IV SOLN
INTRAVENOUS | Status: DC | PRN
  Administered 2019-01-01: 400 mg via INTRAVENOUS

## 2019-01-01 MED ORDER — ROCURONIUM BROMIDE 10 MG/ML (PF) SYRINGE
PREFILLED_SYRINGE | INTRAVENOUS | Status: DC | PRN
  Administered 2019-01-01: 40 mg via INTRAVENOUS

## 2019-01-01 MED ORDER — INDOMETHACIN 50 MG RE SUPP
RECTAL | Status: AC
  Filled 2019-01-01: qty 2

## 2019-01-01 MED ORDER — CIPROFLOXACIN IN D5W 400 MG/200ML IV SOLN
INTRAVENOUS | Status: AC
  Filled 2019-01-01: qty 200

## 2019-01-01 MED ORDER — LACTATED RINGERS IV SOLN
INTRAVENOUS | Status: DC
  Administered 2019-01-01: 1000 mL via INTRAVENOUS

## 2019-01-01 MED ORDER — ONDANSETRON HCL 4 MG/2ML IJ SOLN
INTRAMUSCULAR | Status: DC | PRN
  Administered 2019-01-01: 4 mg via INTRAVENOUS

## 2019-01-01 MED ORDER — SODIUM CHLORIDE 0.9 % IV SOLN
INTRAVENOUS | Status: DC | PRN
  Administered 2019-01-01: 15 mL

## 2019-01-01 MED ORDER — GLUCAGON HCL RDNA (DIAGNOSTIC) 1 MG IJ SOLR
INTRAMUSCULAR | Status: AC
  Filled 2019-01-01: qty 1

## 2019-01-01 MED ORDER — PROPOFOL 10 MG/ML IV BOLUS
INTRAVENOUS | Status: AC
  Filled 2019-01-01: qty 20

## 2019-01-01 MED ORDER — DEXAMETHASONE SODIUM PHOSPHATE 10 MG/ML IJ SOLN
INTRAMUSCULAR | Status: DC | PRN
  Administered 2019-01-01: 8 mg via INTRAVENOUS

## 2019-01-01 NOTE — Anesthesia Procedure Notes (Signed)
Procedure Name: Intubation Date/Time: 01/01/2019 10:28 AM Performed by: Victoriano Lain, CRNA Pre-anesthesia Checklist: Patient identified, Emergency Drugs available, Suction available, Patient being monitored and Timeout performed Patient Re-evaluated:Patient Re-evaluated prior to induction Oxygen Delivery Method: Circle system utilized Preoxygenation: Pre-oxygenation with 100% oxygen Induction Type: IV induction, Rapid sequence and Cricoid Pressure applied Laryngoscope Size: Mac and 4 Grade View: Grade I Tube type: Oral Tube size: 7.5 mm Number of attempts: 1 Airway Equipment and Method: Stylet Placement Confirmation: ETT inserted through vocal cords under direct vision,  positive ETCO2 and breath sounds checked- equal and bilateral Secured at: 21 cm Tube secured with: Tape Dental Injury: Teeth and Oropharynx as per pre-operative assessment

## 2019-01-01 NOTE — Anesthesia Preprocedure Evaluation (Signed)
Anesthesia Evaluation  Patient identified by MRN, date of birth, ID band Patient awake    Reviewed: Allergy & Precautions, NPO status , Patient's Chart, lab work & pertinent test results  History of Anesthesia Complications Negative for: history of anesthetic complications  Airway Mallampati: II  TM Distance: >3 FB Neck ROM: Full    Dental no notable dental hx.    Pulmonary neg pulmonary ROS,    Pulmonary exam normal        Cardiovascular hypertension, Pt. on medications negative cardio ROS Normal cardiovascular exam     Neuro/Psych negative neurological ROS     GI/Hepatic Neg liver ROS, Pancreatic mass   Endo/Other  diabetes, Type 2, Insulin Dependent, Oral Hypoglycemic Agents  Renal/GU negative Renal ROS     Musculoskeletal negative musculoskeletal ROS (+)   Abdominal   Peds  Hematology negative hematology ROS (+)   Anesthesia Other Findings Day of surgery medications reviewed with the patient.  Reproductive/Obstetrics                             Anesthesia Physical  Anesthesia Plan  ASA: III  Anesthesia Plan: General   Post-op Pain Management:    Induction: Intravenous  PONV Risk Score and Plan: 3 and Treatment may vary due to age or medical condition, Ondansetron, Dexamethasone and Midazolam  Airway Management Planned: Oral ETT  Additional Equipment:   Intra-op Plan:   Post-operative Plan: Extubation in OR  Informed Consent: I have reviewed the patients History and Physical, chart, labs and discussed the procedure including the risks, benefits and alternatives for the proposed anesthesia with the patient or authorized representative who has indicated his/her understanding and acceptance.     Dental advisory given  Plan Discussed with: CRNA  Anesthesia Plan Comments:         Anesthesia Quick Evaluation

## 2019-01-01 NOTE — Discharge Instructions (Signed)

## 2019-01-01 NOTE — Op Note (Signed)
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 Estimated Blood Loss:     Estimated blood loss was minimal. Procedure:                Pre-Anesthesia Assessment:                           - Prior to the procedure, a History and Physical                            was performed, and patient medications and                            allergies were reviewed. The patient's tolerance of                            previous anesthesia was also reviewed. The risks                            and benefits of the procedure and the sedation                            options and risks were discussed with the patient.                            All questions were answered, and informed consent                            was obtained. Prior Anticoagulants: The patient has                            taken no previous anticoagulant or antiplatelet                            agents. ASA Grade Assessment: II - A patient with                            mild systemic disease. After reviewing the risks  and benefits, the patient was deemed in                            satisfactory condition to undergo the procedure.                           - Sedation was administered by an anesthesia                            professional. General anesthesia was attained.                           After obtaining informed consent, the endoscope was                            passed under direct vision.  Throughout the                            procedure, the patient's blood pressure, pulse, and                            oxygen saturations were monitored continuously. The                            [id number] ([id number] Olympus Linear EUS was                            introduced through the mouth, and advanced to the                            second part of duodenum. The upper EUS was                            technically difficult and complex. The patient                            tolerated the procedure well. Scope In: Scope Out: Findings:      ENDOSCOPIC FINDING: :      The examined esophagus was endoscopically normal.      The entire examined stomach was endoscopically normal.      The examined duodenum was endoscopically normal.      ENDOSONOGRAPHIC FINDING: :      An irregular mass was identified in the pancreatic head. The mass was       hypoechoic. The mass measured 25 mm by 25 mm in maximal cross-sectional       diameter. The outer margins were irregular. Adjacent to the head of the       pancreas were two lymph nodes. The long axis measured 10 mm. An intact       interface was seen between the mass and the superior mesenteric artery,       celiac trunk, portal vein, superior mesenteric vein and splenoportal       confluence suggesting a lack of invasion. The remainder of the pancreas       was examined. The endosonographic appearance of parenchyma and the  upstream pancreatic duct indicated duct dilation. Fine needle aspiration       for cytology was performed. Color Doppler imaging was utilized prior to       needle puncture to confirm a lack of significant vascular structures       within the needle path. Five passes were made with the 25 gauge needle       using a transduodenal approach. A stylet was used. A cytotechnologist       was present to evaluate the adequacy of the specimen. The cellularity of       the specimen was adequate. Final cytology results are  pending.      The mass was initially difficult to locate as it was mildly hypoechoic.       In certain views it appeared to be isoechoic with the rest of the       pancreas. Five 25 gauge FNA needle passes were performed and excellent       samples were obtained. It was technically challenging to obtain the       specimens as the lesion was highly vascular. Only narrow windows for FNA       were possible. Two lymph nodes were identified. FNA was not possible as       there was too much surrounding vasculature. There was no invasion into       any vascular structures. Impression:               - Normal esophagus.                           - Normal stomach.                           - Normal examined duodenum.                           - A mass was identified in the pancreatic head with                            two adjacent lymph nodes. Cytology results are                            pending. 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 Fine needle                            aspiration performed. Moderate Sedation:      Not Applicable - Patient had care per Anesthesia. Recommendation:           - Convert to ERCP. Procedure Code(s):        --- Professional ---                           [phone], Esophagogastroduodenoscopy, flexible,                            transoral; with transendoscopic ultrasound-guided  intramural or transmural fine needle                            aspiration/biopsy(s), (includes endoscopic                            ultrasound examination limited to the esophagus,                            stomach or duodenum, and adjacent structures) Diagnosis Code(s):        --- Professional ---                           K86.89, Other specified diseases of pancreas CPT copyright 2019 American Medical Association. All rights reserved. The codes documented in this report are preliminary and upon  coder review may  be revised to meet current compliance requirements. Carol Ada, MD Carol Ada, MD 01/01/2019 12:17:10 PM This report has been signed electronically. Number of Addenda: 0

## 2019-01-01 NOTE — Op Note (Signed)
[facility] Patient Name: Alyssa Ross Procedure Date: 01/01/2019 MRN: [id number] Attending MD: Carol Ada , MD Date of Birth: 12-08-1965 CSN: [id number] Age: 53 Admit Type: Outpatient Procedure:                ERCP Indications:              Malignant stricture of the common bile duct Providers:                Carol Ada, MD, Cleda Daub, RN, Cletis Athens,                            Technician Referring MD:              Medicines:                General Anesthesia Complications:            No immediate complications. Estimated Blood Loss:     Estimated blood loss: none. Procedure:                Pre-Anesthesia Assessment:                           - Prior to the procedure, a History and Physical                            was performed, and patient medications and                            allergies were reviewed. The patient's tolerance of                            previous anesthesia was also reviewed. The risks                            and benefits of the procedure and the sedation                            options and risks were discussed with the patient.                            All questions were answered, and informed consent                            was obtained. Prior Anticoagulants: The patient has                            taken no previous anticoagulant or antiplatelet                            agents. ASA Grade Assessment: II - A patient with                            mild systemic disease. After reviewing the risks  and benefits, the patient was deemed in                            satisfactory condition to undergo the procedure.                           - Sedation was administered by an anesthesia                            professional. General anesthesia was attained.                           After obtaining informed consent, the scope was                            passed under direct vision. Throughout  the                            procedure, the patient's blood pressure, pulse, and                            oxygen saturations were monitored continuously. The                            [id number]-Q180V ([id number] Olympus duodenoscope was                            introduced through the mouth, and used to inject                            contrast into and used for direct visualization of                            the bile duct. The ERCP was accomplished without                            difficulty. The patient tolerated the procedure                            well. Scope In: Scope Out: Findings:      The major papilla was normal. A straight Roadrunner wire was passed into       the biliary tree. Biliary sphincterotomy was made with a traction       (standard) sphincterotome. There was no post-sphincterotomy bleeding.       One stent was removed from the common bile duct using a snare. One 10 mm       by 4 cm covered metal stent was placed 4.5 cm into the common bile duct.       Bile flowed through the stent. The stent was in good position.      The EUS was converted to an ERCP. The prior plastic biliary stent was       removed. The CBD was easily cannulated and there was no manipulation of       the PD. Contrast injection showed a decreased caliber of the CBD  s/p       stent placement. A 10 mm x 40 mm covered metallic stent was easily       inserted. Clear bile drained. Impression:               - The major papilla appeared normal.                           - A biliary sphincterotomy was performed.                           - One stent was removed from the common bile duct.                           - One covered metal stent was placed into the                            common bile duct. Moderate Sedation:      Not Applicable - Patient had care per Anesthesia. Recommendation:           - Patient has a contact number available for                            emergencies. The signs and  symptoms of potential                            delayed complications were discussed with the                            patient. Return to normal activities tomorrow.                            Written discharge instructions were provided to the                            patient.                           - Resume regular diet.                           - Await cytology results. Procedure Code(s):        --- Professional ---                           [id number], Endoscopic retrograde                            cholangiopancreatography (ERCP); with removal and                            exchange of stent(s), biliary or pancreatic duct,                            including pre- and post-dilation and guide wire  passage, when performed, including sphincterotomy,                            when performed, each stent exchanged Diagnosis Code(s):        --- Professional ---                           K83.1, Obstruction of bile duct                           Z46.59, Encounter for fitting and adjustment of                            other gastrointestinal appliance and device CPT copyright 2019 [employer]. All rights reserved. The codes documented in this report are preliminary and upon coder review may  be revised to meet current compliance requirements. Carol Ada, MD Carol Ada, MD 01/01/2019 12:21:32 PM This report has been signed electronically. Number of Addenda: 0

## 2019-01-01 NOTE — Interval H&P Note (Signed)
History and Physical Interval Note:  01/01/2019 10:14 AM  Alyssa Ross  has presented today for surgery, with the diagnosis of pancreatic mass.  The various methods of treatment have been discussed with the patient and family. After consideration of risks, benefits and other options for treatment, the patient has consented to  Procedure(s): ENDOSCOPIC RETROGRADE CHOLANGIOPANCREATOGRAPHY (ERCP) WITH PROPOFOL (N/A) UPPER ESOPHAGEAL ENDOSCOPIC ULTRASOUND (EUS) (N/A) as a surgical intervention.  The patient's history has been reviewed, patient examined, no change in status, stable for surgery.  I have reviewed the patient's chart and labs.  Questions were answered to the patient's satisfaction.     [name]

## 2019-01-01 NOTE — Anesthesia Postprocedure Evaluation (Signed)
Anesthesia Post Note  Patient: Alyssa Ross  Procedure(s) Performed: ENDOSCOPIC RETROGRADE CHOLANGIOPANCREATOGRAPHY (ERCP) WITH PROPOFOL (N/A ) UPPER ESOPHAGEAL ENDOSCOPIC ULTRASOUND (EUS) (N/A ) FINE NEEDLE ASPIRATION (FNA) LINEAR (N/A ) STENT REMOVAL BILIARY STENT PLACEMENT (N/A )     Patient location during evaluation: Endoscopy Anesthesia Type: General Level of consciousness: awake and alert Pain management: pain level controlled Vital Signs Assessment: post-procedure vital signs reviewed and stable Respiratory status: spontaneous breathing, nonlabored ventilation, respiratory function stable and patient connected to nasal cannula oxygen Cardiovascular status: blood pressure returned to baseline and stable Postop Assessment: no apparent nausea or vomiting Anesthetic complications: no    Last Vitals:  Vitals:   01/01/19 1240 01/01/19 1250  BP: (!) 151/91 (!) 153/86  Pulse: 85 80  Resp: 14   Temp:    SpO2: 94% 95%    Last Pain:  Vitals:   01/01/19 1220  TempSrc: Temporal  PainSc: 0-No pain                 Montez Hageman

## 2019-01-01 NOTE — Transfer of Care (Signed)
Immediate Anesthesia Transfer of Care Note  Patient: Alyssa Ross  Procedure(s) Performed: ENDOSCOPIC RETROGRADE CHOLANGIOPANCREATOGRAPHY (ERCP) WITH PROPOFOL (N/A ) UPPER ESOPHAGEAL ENDOSCOPIC ULTRASOUND (EUS) (N/A ) FINE NEEDLE ASPIRATION (FNA) LINEAR (N/A ) STENT REMOVAL  Patient Location: PACU and Endoscopy Unit  Anesthesia Type:General  Level of Consciousness: awake, alert , oriented and patient cooperative  Airway & Oxygen Therapy: Patient Spontanous Breathing and Patient connected to face mask  Post-op Assessment: Report given to RN, Post -op Vital signs reviewed and stable and Patient moving all extremities  Post vital signs: Reviewed and stable  Last Vitals:  Vitals Value Taken Time  BP    Temp    Pulse 93 01/01/19 1219  Resp 15 01/01/19 1219  SpO2 100 % 01/01/19 1219  Vitals shown include unvalidated device data.  Last Pain:  Vitals:   01/01/19 0930  TempSrc: Oral  PainSc: 0-No pain         Complications: No apparent anesthesia complications

## 2019-01-03 ENCOUNTER — Encounter (HOSPITAL_COMMUNITY): Payer: Self-pay | Admitting: Gastroenterology

## 2019-01-05 ENCOUNTER — Other Ambulatory Visit: Payer: Self-pay

## 2019-01-05 DIAGNOSIS — C250 Malignant neoplasm of head of pancreas: Secondary | ICD-10-CM

## 2019-01-06 DIAGNOSIS — C250 Malignant neoplasm of head of pancreas: Secondary | ICD-10-CM

## 2019-01-06 NOTE — Progress Notes (Signed)
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 Benson Norway.

## 2019-01-07 ENCOUNTER — Encounter: Payer: Self-pay | Admitting: Internal Medicine

## 2019-01-09 ENCOUNTER — Ambulatory Visit: Payer: BLUE CROSS/BLUE SHIELD | Admitting: Internal Medicine

## 2019-01-14 NOTE — Progress Notes (Signed)
Surgical consult with Dr. Barry Dienes on 8/10 @ 9:45 AM.

## 2019-01-18 ENCOUNTER — Other Ambulatory Visit: Payer: Self-pay | Admitting: General Surgery

## 2019-01-18 DIAGNOSIS — C7A8 Other malignant neuroendocrine tumors: Secondary | ICD-10-CM | POA: Diagnosis not present

## 2019-01-18 DIAGNOSIS — C250 Malignant neoplasm of head of pancreas: Secondary | ICD-10-CM | POA: Diagnosis not present

## 2019-01-21 ENCOUNTER — Other Ambulatory Visit: Payer: Self-pay | Admitting: General Surgery

## 2019-01-21 DIAGNOSIS — C7A8 Other malignant neuroendocrine tumors: Secondary | ICD-10-CM

## 2019-01-25 ENCOUNTER — Other Ambulatory Visit: Payer: Self-pay

## 2019-01-25 ENCOUNTER — Encounter: Payer: Self-pay | Admitting: Internal Medicine

## 2019-01-25 ENCOUNTER — Other Ambulatory Visit (INDEPENDENT_AMBULATORY_CARE_PROVIDER_SITE_OTHER): Payer: BC Managed Care – PPO

## 2019-01-25 DIAGNOSIS — E1165 Type 2 diabetes mellitus with hyperglycemia: Secondary | ICD-10-CM | POA: Diagnosis not present

## 2019-01-25 LAB — BASIC METABOLIC PANEL
BUN: 11 mg/dL (ref 6–23)
CO2: 29 mEq/L (ref 19–32)
Calcium: 9.5 mg/dL (ref 8.4–10.5)
Chloride: 102 mEq/L (ref 96–112)
Creatinine, Ser: 0.82 mg/dL (ref 0.40–1.20)
GFR: 88.27 mL/min (ref >60.00)
Glucose, Bld: 256 mg/dL — ABNORMAL HIGH (ref 70–99)
Potassium: 3.9 mEq/L (ref 3.5–5.1)
Sodium: 138 mEq/L (ref 135–145)

## 2019-01-26 LAB — FRUCTOSAMINE: Fructosamine: 373 umol/L — ABNORMAL HIGH (ref 0–285)

## 2019-01-27 ENCOUNTER — Other Ambulatory Visit: Payer: Self-pay

## 2019-01-27 ENCOUNTER — Encounter: Payer: Self-pay | Admitting: Endocrinology

## 2019-01-27 ENCOUNTER — Ambulatory Visit (INDEPENDENT_AMBULATORY_CARE_PROVIDER_SITE_OTHER): Payer: BC Managed Care – PPO | Admitting: Endocrinology

## 2019-01-27 VITALS — BP 112/80 | HR 91 | Ht 68.0 in | Wt 216.4 lb

## 2019-01-27 DIAGNOSIS — I10 Essential (primary) hypertension: Secondary | ICD-10-CM

## 2019-01-27 DIAGNOSIS — Z794 Long term (current) use of insulin: Secondary | ICD-10-CM | POA: Diagnosis not present

## 2019-01-27 DIAGNOSIS — E1165 Type 2 diabetes mellitus with hyperglycemia: Secondary | ICD-10-CM | POA: Diagnosis not present

## 2019-01-27 NOTE — Patient Instructions (Addendum)
20 AT Breakfast and 30 at other meal  Extra 5 units per 100mg  over 100 extra if high   Metformin start on Monday  Exercise  Stay on 44 Tresiba

## 2019-01-27 NOTE — Progress Notes (Signed)
Patient ID: Alyssa Ross, female   DOB: Feb 18, 1966, 53 y.o.   MRN: [id number]    Reason for Appointment: Diabetes follow-up   History of Present Illness   Diagnosis: Type 2 DIABETES MELITUS, date of diagnosis 2005   She had previously been on metformin and Amaryl Because of inadequate control she was given Victoza in addition in 2011 With this her blood sugars have been significantly better and her A1c has been as low as 6.2 in 2013 Her blood sugars have been more difficult to control since 2014 an A1c consistently over 7% She  was started on insulin with Levemir in 06/2013 because of  increase in her A1c to 8.5% She had previously been taking Victoza along with Amaryl and metformin without consistent control Also had difficulty losing weight despite taking 1.8 mg Victoza, also had tried the 3 mg dose Because of episodic nausea and diarrhea in 1/16 she went off her Victoza  RECENT history:   INSULIN dose: TRESIBA 44-60 units pm.  Humalog recently 12-20 up to 3 times a day  Non-insulin hypoglycemic drugs: Was on metformin 2250 mg,  A1c previously was 9.2 Fructosamine is 373, increased  Current management, blood sugars and problems identified as follows  She stopped taking her METFORMIN as advised by her PCP even though she had no change in her renal function when she was being evaluated for her pancreatic mass  Her blood sugars are markedly higher now although she thinks that they were getting higher even before having problems with her liver functions  Previously was trying to exercise and lose weight with improving her diet also and had gone down to 205 pounds  She primarily has POSTPRANDIAL hyperglycemia with frequent blood sugars over 300 and sometimes over 400, HIGHEST blood sugars are around 5 PM-10 PM  As a result her blood sugars are within the target range only 20% of the time  Blood sugars appear to go up even after breakfast despite sometimes having low  carbohydrate meal  She thinks she is estimating her mealtime dose based on what she is eating but this is usually inadequate and blood sugars are progressively higher from morning till evening  She is not taking insulin enough to cover her high blood sugars with the correction dose also  Also has stopped exercising  When blood sugars are high she may take up to 60 units of Tresiba but then she will have occasional low sugars around 6-7 AM and she does not understand that this is related to higher doses of basal insulin     Side effects from medications: None   Monitors blood glucose: With freestyle Libre sensor  14-day blood Glucose data and averages by time of day from review of download today  CGM use % of time  88  2-week average/SD  245, was 164  Time in range  20   %  % Time Above 180  31  % Time above 250  48  % Time Below 70  1     PRE-MEAL Fasting Lunch Dinner Bedtime Overall  Glucose range:       Averages:  167  228  310  279    POST-MEAL PC Breakfast PC Lunch PC Dinner  Glucose range:     Averages:  186  263  303      Meals: 3 meals per day. Dinner 8-9 pm     DIET: As above  Physical activity: exercise:  0-6/7  Wt Readings from Last 3 Encounters:  01/27/19 216 lb 6.4 oz (98.2 kg)  01/01/19 207 lb 0.2 oz (93.9 kg)  12/25/18 205 lb (93 kg)    LABS:  Lab Results  Component Value Date   HGBA1C 9.2 (H) 11/12/2018   HGBA1C 9.2 (H) 06/16/2018   HGBA1C 8.6 (H) 01/23/2018   Lab Results  Component Value Date   MICROALBUR 30 12/01/2018   LDLCALC 63 07/11/2018   CREATININE 0.82 01/25/2019    Lab on 01/25/2019  Component Date Value Ref Range Status  . Fructosamine 01/25/2019 373* 0 - 285 umol/L Final   Comment: Published reference interval for apparently healthy subjects between age 28 and 81 is 61 - 285 umol/L and in a poorly controlled diabetic population is 228 - 563 umol/L with a mean of 396 umol/L.   Marland Kitchen Sodium 01/25/2019 138  135 - 145  mEq/L Final  . Potassium 01/25/2019 3.9  3.5 - 5.1 mEq/L Final  . Chloride 01/25/2019 102  96 - 112 mEq/L Final  . CO2 01/25/2019 29  19 - 32 mEq/L Final  . Glucose, Bld 01/25/2019 256* 70 - 99 mg/dL Final  . BUN 01/25/2019 11  6 - 23 mg/dL Final  . Creatinine, Ser 01/25/2019 0.82  0.40 - 1.20 mg/dL Final  . Calcium 01/25/2019 9.5  8.4 - 10.5 mg/dL Final  . GFR 01/25/2019 88.27  >60.00 mL/min Final    Allergies as of 01/27/2019      Reactions   Cherry Rash   Lemon Oil Rash      Medication List       Accurate as of January 27, 2019  9:49 AM. If you have any questions, ask your nurse or doctor.        STOP taking these medications   cholecalciferol 25 MCG (1000 UT) tablet Commonly known as: VITAMIN D3 Stopped by: Elayne Snare, MD   glucose blood test strip Commonly known as: ONE TOUCH ULTRA TEST Stopped by: Elayne Snare, MD   hydrOXYzine 10 MG tablet Commonly known as: ATARAX/VISTARIL Stopped by: Elayne Snare, MD   metFORMIN 750 MG 24 hr tablet Commonly known as: GLUCOPHAGE-XR Stopped by: Elayne Snare, MD   MONISTAT 7 VA Stopped by: Elayne Snare, MD   multivitamin with minerals Tabs tablet Stopped by: Elayne Snare, MD   OMEGA 3 PO Stopped by: Elayne Snare, MD   OneTouch Delica Lancets 23N Misc Stopped by: Elayne Snare, MD   Probiotic Caps Stopped by: Elayne Snare, MD     TAKE these medications   FreeStyle Libre 14 Day Reader Kerrin Mo USE READER TO CHECK BLOOD GLUCOSE WITH FREESTYLE LIBRE SENSORS.   FreeStyle Libre 14 Day Sensor Misc APPLY SENSOR TO BODY ONCE EVERY 14 DAYS TO MONITOR BLOOD GLUCOSE   insulin lispro 100 UNIT/ML KwikPen Commonly known as: HumaLOG KwikPen Inject 20 units under the skin three times daily before meals. What changed:   how much to take  how to take this  when to take this  reasons to take this  additional instructions   Insulin Pen Needle 32G X 4 MM Misc Commonly known as: NovoFine Plus Use 3 per day to inject insulin    lisinopril-hydrochlorothiazide 10-12.5 MG tablet Commonly known as: ZESTORETIC Take 1 tablet by mouth daily. Take 1/2 tablet by mouth once daily.   loratadine 10 MG tablet Commonly known as: CLARITIN Take 10 mg by mouth at bedtime.   potassium chloride SA 20 MEQ tablet Commonly known as: K-DUR TAKE 1 TABLET DAILY  What changed: when to take this   TRESIBA FLEXTOUCH [place] Inject 29-60 Units into the skin at bedtime.       Allergies:  Allergies  Allergen Reactions  . Cherry Rash  . Lemon Oil Rash    Past Medical History:  Diagnosis Date  . ASCUS (atypical squamous cells of undetermined significance) on Pap smear 08/06/1999  . Breast mass in female 2002   Left  . Diabetes mellitus without complication ([place])   . Fibroid uterus 2010  . Hypertension   . Irregular bleeding 2011  . 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 PONV (postoperative nausea and vomiting)    nausea  vomitting after 12/25/18 ERCP  . Yeast vaginitis 2006    Past Surgical History:  Procedure Laterality Date  . BILIARY STENT PLACEMENT N/A 12/25/2018   Procedure: BILIARY STENT PLACEMENT;  Surgeon: Carol Ada, MD;  Location: [facility];  Service: Endoscopy;  Laterality: N/A;  . BILIARY STENT PLACEMENT N/A 01/01/2019   Procedure: BILIARY STENT PLACEMENT;  Surgeon: Carol Ada, MD;  Location: [facility];  Service: Endoscopy;  Laterality: N/A;  . DILATATION & CURETTAGE/HYSTEROSCOPY WITH TRUECLEAR N/A 01/20/2014   Procedure: DILATATION & CURETTAGE/HYSTEROSCOPY WITH TRUCLEAR;  Surgeon: [name], MD;  Location: [facility];  Service: Gynecology;  Laterality: N/A;  . DILATATION & CURRETTAGE/HYSTEROSCOPY WITH RESECTOCOPE N/A 01/20/2014   Procedure: [place];  Surgeon: [name], MD;  Location: [facility];  Service: Gynecology;  Laterality: N/A;  . ENDOSCOPIC RETROGRADE CHOLANGIOPANCREATOGRAPHY (ERCP) WITH PROPOFOL N/A 12/25/2018   Procedure:  ENDOSCOPIC RETROGRADE CHOLANGIOPANCREATOGRAPHY (ERCP) WITH PROPOFOL;  Surgeon: Carol Ada, MD;  Location: [facility];  Service: Endoscopy;  Laterality: N/A;  . ENDOSCOPIC RETROGRADE CHOLANGIOPANCREATOGRAPHY (ERCP) WITH PROPOFOL N/A 01/01/2019   Procedure: ENDOSCOPIC RETROGRADE CHOLANGIOPANCREATOGRAPHY (ERCP) WITH PROPOFOL;  Surgeon: Carol Ada, MD;  Location: [facility];  Service: Endoscopy;  Laterality: N/A;  . ERCP  12/25/2018  . FINE NEEDLE ASPIRATION N/A 01/01/2019   Procedure: FINE NEEDLE ASPIRATION (FNA) LINEAR;  Surgeon: Carol Ada, MD;  Location: [facility];  Service: Endoscopy;  Laterality: N/A;  . NO PAST SURGERIES    . SPHINCTEROTOMY  12/25/2018   Procedure: SPHINCTEROTOMY;  Surgeon: Carol Ada, MD;  Location: [facility];  Service: Endoscopy;;  . Lavell Islam REMOVAL  01/01/2019   Procedure: STENT REMOVAL;  Surgeon: Carol Ada, MD;  Location: [facility];  Service: Endoscopy;;  . UPPER ESOPHAGEAL ENDOSCOPIC ULTRASOUND (EUS) N/A 01/01/2019   Procedure: UPPER ESOPHAGEAL ENDOSCOPIC ULTRASOUND (EUS);  Surgeon: Carol Ada, MD;  Location: [facility];  Service: Endoscopy;  Laterality: N/A;    Family History  Problem Relation Age of Onset  . Diabetes Mother   . Dementia Mother   . Hyperlipidemia Mother   . Hypertension Mother   . Irregular heart beat Mother   . Diabetes Father   . Hypertension Father   . Hyperlipidemia Father   . Down syndrome Sister   . Diabetes Sister   . Hyperlipidemia Brother   . Heart Problems Brother     Social History:  reports that she has never smoked. She has never used smokeless tobacco. She reports current alcohol use. She reports that she does not use drugs.  REVIEW of systems:  PANCREATIC mass: She is going to be scheduled for [place] procedure   She is being followed by her PCP for hypertension On Zestoretic 20 mg   BP Readings from Last 3 Encounters:  01/27/19 112/80  01/01/19 (!) 153/86  12/25/18 (!) 141/86  Has  had hypercholesterolemia, was taking Crestor 20 mg with good control, has history of mild increase in triglycerides also She has been told by PCP not to take this  Labs as follows  Lab Results  Component Value Date   CHOL 129 07/11/2018   HDL 52 07/11/2018   LDLCALC 63 07/11/2018   LDLDIRECT 97.7 02/11/2014   TRIG 69 07/11/2018   CHOLHDL 2.5 07/11/2018      Examination:   BP 112/80 (BP Location: Left Arm, Patient Position: Sitting, Cuff Size: Large)   Pulse 91   Ht 5\' 8"  (1.727 m)   Wt 216 lb 6.4 oz (98.2 kg)   SpO2 98%   BMI 32.90 kg/m   Body mass index is 32.9 kg/m.    Assessment/Plan:   Diabetes type 2, uncontrolled  See history of present illness for detailed discussion of her current management, blood sugar patterns and problems identified  Her A1c is usually over 8%, last at 9.2  She is insulin-dependent and appears to be more insulin deficient as well as insulin resistant primarily postprandial hyperglycemia as discussed above This is probably multifactorial based on her not taking metformin without consulting here as well as stress, lack of exercise and inconsistent diet.  She is adjusting her basal insulin sometimes causing overnight hypoglycemia but her blood sugars are progressively higher between breakfast and bedtime Discussed in detail the role of mealtime insulin which she will need to increase significantly and take as much as 40 units based on her blood sugar and type of meal Likely needs 20 units of insulin at breakfast and 30 at lunch and dinner as base amounts Additional 5 units per 100 mg for correction She will not increase her Tyler Aas unless fasting readings are going higher Restart METFORMIN after her CT scan is done, last renal function normal  HYPERTENSION: Followed by PCP  Lipids: Last lipid panel was excellent  Patient Instructions  20 AT Breakfast and 30 at other meal  Extra 5 units per 100mg  over 100 extra if high   Metformin start  on Monday  Exercise  Stay on 44 Tresiba    Counseling time on subjects discussed in assessment and plan sections is over 50% of today's 25 minute visit   Elayne Snare 01/27/2019, 9:49 AM   Note: This office note was prepared with Dragon voice recognition system technology. Any transcriptional errors that result from this process are unintentional.

## 2019-01-28 ENCOUNTER — Telehealth: Payer: Self-pay | Admitting: Genetic Counselor

## 2019-01-28 NOTE — Telephone Encounter (Signed)
Received a genetic counseling referral from Dr. Barry Dienes. Ms. No has been cld and scheduled to see Lanier Prude on 8/26 at 1pm. She declined a webex visit and preferred an in person visit. Aware to arrive 15 minutes early.

## 2019-01-29 ENCOUNTER — Ambulatory Visit
Admission: RE | Admit: 2019-01-29 | Discharge: 2019-01-29 | Disposition: A | Discharge status: Home / Self Care | Payer: BC Managed Care – PPO | Source: Ambulatory Visit | Attending: General Surgery | Admitting: General Surgery

## 2019-01-29 DIAGNOSIS — C7A8 Other malignant neuroendocrine tumors: Secondary | ICD-10-CM

## 2019-01-29 DIAGNOSIS — N281 Cyst of kidney, acquired: Secondary | ICD-10-CM | POA: Diagnosis not present

## 2019-01-29 DIAGNOSIS — D490 Neoplasm of unspecified behavior of digestive system: Secondary | ICD-10-CM | POA: Diagnosis not present

## 2019-01-29 DIAGNOSIS — E279 Disorder of adrenal gland, unspecified: Secondary | ICD-10-CM | POA: Diagnosis not present

## 2019-01-29 DIAGNOSIS — R918 Other nonspecific abnormal finding of lung field: Secondary | ICD-10-CM | POA: Diagnosis not present

## 2019-01-29 MED ORDER — IOPAMIDOL (ISOVUE-300) INJECTION 61%
100.0000 mL | Freq: Once | INTRAVENOUS | Status: AC | PRN
  Administered 2019-01-29: 100 mL via INTRAVENOUS

## 2019-02-02 ENCOUNTER — Telehealth: Payer: Self-pay | Admitting: Genetic Counselor

## 2019-02-02 NOTE — Telephone Encounter (Signed)
Patient called regarding upcoming Webex appointment, patient is notified and e-mail has been sent.

## 2019-02-03 ENCOUNTER — Other Ambulatory Visit: Payer: BC Managed Care – PPO

## 2019-02-03 ENCOUNTER — Other Ambulatory Visit: Payer: Self-pay | Admitting: Genetic Counselor

## 2019-02-03 ENCOUNTER — Inpatient Hospital Stay: Payer: BC Managed Care – PPO

## 2019-02-03 ENCOUNTER — Inpatient Hospital Stay: Payer: BC Managed Care – PPO | Attending: Genetic Counselor | Admitting: Genetic Counselor

## 2019-02-03 ENCOUNTER — Other Ambulatory Visit: Payer: Self-pay

## 2019-02-03 DIAGNOSIS — Z1379 Encounter for other screening for genetic and chromosomal anomalies: Secondary | ICD-10-CM

## 2019-02-03 DIAGNOSIS — D3A8 Other benign neuroendocrine tumors: Secondary | ICD-10-CM

## 2019-02-03 DIAGNOSIS — Z800 Family history of malignant neoplasm of digestive organs: Secondary | ICD-10-CM

## 2019-02-04 ENCOUNTER — Encounter: Payer: Self-pay | Admitting: Genetic Counselor

## 2019-02-04 DIAGNOSIS — Z800 Family history of malignant neoplasm of digestive organs: Secondary | ICD-10-CM | POA: Insufficient documentation

## 2019-02-04 DIAGNOSIS — D3A8 Other benign neuroendocrine tumors: Secondary | ICD-10-CM

## 2019-02-04 HISTORY — DX: Other benign neuroendocrine tumors: D3A.8

## 2019-02-04 NOTE — Progress Notes (Addendum)
REFERRING PROVIDER: Stark Klein, MD [address] [place],  [place] [address]  PRIMARY PROVIDER:  Glendale Chard, MD  PRIMARY REASON FOR VISIT:  1. Family history of pancreatic cancer   2. Primary pancreatic neuroendocrine tumor      HISTORY OF PRESENT ILLNESS:   I connected with Alyssa Ross on 02/04/2019 at 1 PM EDT by Webex video conference and verified that I am speaking with the correct person using two identifiers.   Patient location: Home Provider location: Office  Alyssa Ross, a 53 y.o. female, was seen for a [facility] cancer genetics consultation at the request of Dr. Barry Dienes due to a personal and family history of cancer.  Alyssa Ross presents to clinic today to discuss the possibility of a hereditary predisposition to cancer, genetic testing, and to further clarify her future cancer risks, as well as potential cancer risks or family members.   In August 2020, at the age of 35, Alyssa Ross was diagnosed with a pancreatic neuroendocrine tumor. The treatment plan surgery and evaluation from oncology for follow up.  As part of the evaluation, Alyssa Ross had a CT scan of the abdomen. A right adrenal mass, suggestive of an adrenal adenoma, along with a complex cystic right kidney lesion were identified in addition to the neuroendocrine tumor.    CANCER HISTORY:  Oncology History   No history exists.     RISK FACTORS:  Menarche was at age 60.  First live birth at age N/A.  Ovaries intact: yes.  Hysterectomy: no.  Menopausal status: postmenopausal.  HRT use: 0 years. Colonoscopy: no; screened with cologuard. Mammogram within the last year: yes. Number of breast biopsies: 0. Up to date with pelvic exams: yes. Any excessive radiation exposure in the past: no  Past Medical History:  Diagnosis Date  . ASCUS (atypical squamous cells of undetermined significance) on Pap smear 08/06/1999  . Breast mass in female 2002   Left  . Diabetes mellitus without complication ([facility])   .  Family history of pancreatic cancer   . Fibroid uterus 2010  . Hypertension   . Irregular bleeding 2011  . 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 PONV (postoperative nausea and vomiting)    nausea  vomitting after 12/25/18 ERCP  . 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 Yeast vaginitis 2006    Past Surgical History:  Procedure Laterality Date  . BILIARY STENT PLACEMENT N/A 12/25/2018   Procedure: BILIARY STENT PLACEMENT;  Surgeon: Carol Ada, MD;  Location: [facility];  Service: Endoscopy;  Laterality: N/A;  . BILIARY STENT PLACEMENT N/A 01/01/2019   Procedure: BILIARY STENT PLACEMENT;  Surgeon: Carol Ada, MD;  Location: [facility];  Service: Endoscopy;  Laterality: N/A;  . DILATATION & CURETTAGE/HYSTEROSCOPY WITH TRUECLEAR N/A 01/20/2014   Procedure: DILATATION & CURETTAGE/HYSTEROSCOPY WITH TRUCLEAR;  Surgeon: [name], MD;  Location: [facility];  Service: Gynecology;  Laterality: N/A;  . DILATATION & CURRETTAGE/HYSTEROSCOPY WITH RESECTOCOPE N/A 01/20/2014   Procedure: [place];  Surgeon: [name], MD;  Location: [facility];  Service: Gynecology;  Laterality: N/A;  . ENDOSCOPIC RETROGRADE CHOLANGIOPANCREATOGRAPHY (ERCP) WITH PROPOFOL N/A 12/25/2018   Procedure: ENDOSCOPIC RETROGRADE CHOLANGIOPANCREATOGRAPHY (ERCP) WITH PROPOFOL;  Surgeon: Carol Ada, MD;  Location: [facility];  Service: Endoscopy;  Laterality: N/A;  . ENDOSCOPIC RETROGRADE CHOLANGIOPANCREATOGRAPHY (ERCP) WITH PROPOFOL N/A 01/01/2019   Procedure: ENDOSCOPIC RETROGRADE CHOLANGIOPANCREATOGRAPHY (ERCP) WITH PROPOFOL;  Surgeon: Carol Ada, MD;  Location: [facility];  Service: Endoscopy;  Laterality: N/A;  . ERCP  12/25/2018  . FINE NEEDLE ASPIRATION N/A 01/01/2019   Procedure: FINE NEEDLE ASPIRATION (FNA) LINEAR;  Surgeon: Carol Ada, MD;  Location: [facility];  Service: Endoscopy;  Laterality: N/A;  . NO PAST SURGERIES     . SPHINCTEROTOMY  12/25/2018   Procedure: SPHINCTEROTOMY;  Surgeon: Carol Ada, MD;  Location: [facility];  Service: Endoscopy;;  . Lavell Islam REMOVAL  01/01/2019   Procedure: STENT REMOVAL;  Surgeon: Carol Ada, MD;  Location: [facility];  Service: Endoscopy;;  . UPPER ESOPHAGEAL ENDOSCOPIC ULTRASOUND (EUS) N/A 01/01/2019   Procedure: UPPER ESOPHAGEAL ENDOSCOPIC ULTRASOUND (EUS);  Surgeon: Carol Ada, MD;  Location: [facility];  Service: Endoscopy;  Laterality: N/A;    Social History   Socioeconomic History  . Marital status: Single    Spouse name: Not on file  . Number of children: Not on file  . Years of education: Not on file  . Highest education level: Not on file  Occupational History  . Not on file  Social Needs  . Financial resource strain: Not on file  . Food insecurity    Worry: Not on file    Inability: Not on file  . Transportation needs    Medical: Not on file    Non-medical: Not on file  Tobacco Use  . Smoking status: Never Smoker  . Smokeless tobacco: Never Used  Substance and Sexual Activity  . Alcohol use: Yes    Comment: rarely  . Drug use: No  . Sexual activity: Yes    Birth control/protection: Post-menopausal  Lifestyle  . Physical activity    Days per week: Not on file    Minutes per session: Not on file  . Stress: Not on file  Relationships  . Social Herbalist on phone: Not on file    Gets together: Not on file    Attends religious service: Not on file    Active member of club or organization: Not on file    Attends meetings of clubs or organizations: Not on file    Relationship status: Not on file  Other Topics Concern  . Not on file  Social History Narrative  . Not on file     FAMILY HISTORY:  We obtained a detailed, 4-generation family history.  Significant diagnoses are listed below: Family History  Problem Relation Age of Onset  . Diabetes Mother   . Dementia Mother   . Hyperlipidemia Mother   . Hypertension  Mother   . Irregular heart beat Mother   . Diabetes Father   . Hypertension Father   . Hyperlipidemia Father   . Down syndrome Sister   . Diabetes Sister   . Hyperlipidemia Brother   . Heart Problems Brother   . Goiter Maternal Aunt   . Thyroid nodules Sister   . Cancer Cousin 60       eye; maternal first cousin  . Goiter Cousin   . Cancer Paternal Aunt        unknown form of cancer  . Cancer Cousin        unknown form of cancer; paternal first cousin  . Pancreatic cancer Cousin 51       paternal first cousin  . Cancer Cousin 78       unknown cancer; paternal first cousin  . Cancer Cousin 60       unknown cancer; paternal first cousin    The patient does not have children.  She has two brothers  and two sisters.  Both brothers are cancer free, but one was identified as a carrier for Dyann Kief disease on prenatal screening.  Her oldest sister has a thyroid nodule and a breast mass both of which are followed.  Her other sister is healthy.  The patient's father is deceased and her mother is living.  The patient's mother has three brothers and four sisters.  One sister has a daughter who had an eye cancer at 65.  This sister and her daughter both have a goiter.  The maternal grandparents are deceased from non cancer related issues.  The patient's father died at 62.  He had four brothers and five sisters.  One sister had an unknown cancer at 36.  Another sister had three children with cancer, two are unknown cancer and one had pancreatic cancer.  One brother had a daughter with an unknown cancer.  The patient's paternal grandparents are deceased from non cancer related issues.  Ms. Darko is unaware of previous family history of genetic testing for hereditary cancer risks. Patient's maternal ancestors are of African American descent, and paternal ancestors are of African American descent. There is possible reported Ashkenazi Jewish ancestry. There is no known consanguinity.    GENETIC  COUNSELING ASSESSMENT: Alyssa Ross is a 53 y.o. female with a personal and family history of cancer which is somewhat suggestive of a hereditary cancer syndrome, such as MEN1, and predisposition to cancer given her neuroendocrine pancreatic tumor and the adrenal mass. We, therefore, discussed and recommended the following at today's visit.   DISCUSSION: We discussed that 5 - 10% of pancreatic cancer is hereditary, with most cases associated with BRCA mutations.  There are other genes that can be associated with hereditary pancreatic cancer syndromes.  These include ATM and PALB2.  We reviewed cases of pancreatic cancer, and that only around 5% of pancreatic cancer is due to neuroendocrine tumors.  When considering PNET tumors, if they are hereditary, most will be due to MEN1, and some may also be due to VHL. We reviewed the family history and did not determine anyone with pituitary or parathyroid tumors, or symptoms.  We also reviewed the history for lumps/bumps on the skin that can sometimes be associated with MEN1.  There is not a family history of VHL symptoms either, however the patient does have an adrenal mass, which can sometimes be seen in MEN1.  We discussed that testing is beneficial for several reasons including knowing how to follow individuals after completing their treatment and understand if other family members could be at risk for cancer and allow them to undergo genetic testing.   We reviewed the characteristics, features and inheritance patterns of hereditary cancer syndromes. We also discussed genetic testing, including the appropriate family members to test, the process of testing, insurance coverage and turn-around-time for results. We discussed the implications of a negative, positive, carrier and/or variant of uncertain significant result. We recommended Alyssa Ross pursue genetic testing for the Multi-cancer gene panel. The Multi-Gene Panel offered by [facility] includes sequencing and/or  deletion duplication testing of the following 85 genes: AIP, ALK, APC, ATM, AXIN2,BAP1,  BARD1, BLM, BMPR1A, BRCA1, BRCA2, BRIP1, CASR, CDC73, CDH1, CDK4, CDKN1B, CDKN1C, CDKN2A (p14ARF), CDKN2A (p16INK4a), CEBPA, CHEK2, CTNNA1, DICER1, DIS3L2, EGFR (c.2369C>T, p.Thr790Met variant only), EPCAM (Deletion/duplication testing only), FH, FLCN, GATA2, GPC3, GREM1 (Promoter region deletion/duplication testing only), HOXB13 (c.251G>A, p.Gly84Glu), HRAS, KIT, MAX, MEN1, MET, MITF (c.952G>A, p.Glu318Lys variant only), MLH1, MSH2, MSH3, MSH6, MUTYH, NBN, NF1, NF2, NTHL1, PALB2, PDGFRA,  PHOX2B, PMS2, POLD1, POLE, POT1, PRKAR1A, PTCH1, PTEN, RAD50, RAD51C, RAD51D, RB1, RECQL4, RET, RNF43, RUNX1, SDHAF2, SDHA (sequence changes only), SDHB, SDHC, SDHD, SMAD4, SMARCA4, SMARCB1, SMARCE1, STK11, SUFU, TERC, TERT, TMEM127, TP53, TSC1, TSC2, VHL, WRN and WT1.    Based on Alyssa Ross's personal and family history of cancer, she meets medical criteria for genetic testing. Despite that she meets criteria, she may still have an out of pocket cost. We discussed that if her out of pocket cost for testing is over $100, the laboratory will call and confirm whether she wants to proceed with testing.  If the out of pocket cost of testing is less than $100 she will be billed by the genetic testing laboratory.   PLAN: After considering the risks, benefits, and limitations, Alyssa Ross provided informed consent to pursue genetic testing and the blood sample was sent to [facility] for analysis of the Multi cancer panel. Results should be available within approximately 2-3 weeks' time, at which point they will be disclosed by telephone to Alyssa Ross, as will any additional recommendations warranted by these results. Alyssa Ross will receive a summary of her genetic counseling visit and a copy of her results once available. This information will also be available in Epic.   Lastly, we encouraged Alyssa Ross to remain in contact with cancer genetics  annually so that we can continuously update the family history and inform her of any changes in cancer genetics and testing that may be of benefit for this family.   Alyssa Ross questions were answered to her satisfaction today. Our contact information was provided should additional questions or concerns arise. Thank you for the referral and allowing [place] to share in the care of your patient.   Alyssa Ross P. Florene Glen, [place], [facility] Licensed, Insurance risk surveyor [email or web address] phone: [phone]  The patient was seen for a total of 45 minutes in face-to-face genetic counseling.  This patient was discussed with Drs. Magrinat, Lindi Adie and/or Burr Medico who agrees with the above.    _______________________________________________________________________ For Office Staff:  Number of people involved in session: 2 Was an Intern/ student involved with case: yes

## 2019-02-11 ENCOUNTER — Encounter: Payer: Self-pay | Admitting: Internal Medicine

## 2019-02-12 ENCOUNTER — Encounter: Payer: Self-pay | Admitting: Internal Medicine

## 2019-02-19 ENCOUNTER — Encounter: Payer: Self-pay | Admitting: Internal Medicine

## 2019-02-23 ENCOUNTER — Telehealth: Payer: Self-pay | Admitting: Genetic Counselor

## 2019-02-23 ENCOUNTER — Other Ambulatory Visit (HOSPITAL_COMMUNITY): Payer: Self-pay | Admitting: General Surgery

## 2019-02-23 ENCOUNTER — Encounter: Payer: Self-pay | Admitting: Genetic Counselor

## 2019-02-23 ENCOUNTER — Ambulatory Visit: Payer: Self-pay | Admitting: Genetic Counselor

## 2019-02-23 DIAGNOSIS — Z1379 Encounter for other screening for genetic and chromosomal anomalies: Secondary | ICD-10-CM | POA: Insufficient documentation

## 2019-02-23 DIAGNOSIS — D3A8 Other benign neuroendocrine tumors: Secondary | ICD-10-CM

## 2019-02-23 DIAGNOSIS — C7A8 Other malignant neuroendocrine tumors: Secondary | ICD-10-CM

## 2019-02-23 NOTE — Telephone Encounter (Signed)
Revealed negative genetic testing.  Discussed that we do not know why she has a pancreatic neuroendocine cancer or why there is cancer in the family. It could be due to a different gene that we are not testing, or maybe our current technology may not be able to pick something up.  It will be important for her to keep in contact with genetics to keep up with whether additional testing may be needed.

## 2019-02-23 NOTE — Progress Notes (Addendum)
HPI:  Ms. Chirino was previously seen in the [place] clinic due to a personal history of pancreatic neuroendocrine tumor and family history of cancer and concerns regarding a hereditary predisposition to cancer. Please refer to our prior cancer genetics clinic note for more information regarding our discussion, assessment and recommendations, at the time. Ms. Lebeck recent genetic test results were disclosed to her, as were recommendations warranted by these results. These results and recommendations are discussed in more detail below.  CANCER HISTORY:  Oncology History   No history exists.    FAMILY HISTORY:  We obtained a detailed, 4-generation family history.  Significant diagnoses are listed below: Family History  Problem Relation Age of Onset   Diabetes Mother    Dementia Mother    Hyperlipidemia Mother    Hypertension Mother    Irregular heart beat Mother    Diabetes Father    Hypertension Father    Hyperlipidemia Father    Down syndrome Sister    Diabetes Sister    Hyperlipidemia Brother    Heart Problems Brother    Goiter Maternal Aunt    Thyroid nodules Sister    Cancer Cousin 48       eye; maternal first cousin   Goiter Cousin    Cancer Paternal Aunt        unknown form of cancer   Cancer Cousin        unknown form of cancer; paternal first cousin   Pancreatic cancer Cousin 54       paternal first cousin   Cancer Cousin 65       unknown cancer; paternal first cousin   Cancer Cousin 67       unknown cancer; paternal first cousin    The patient does not have children.  She has two brothers and two sisters.  Both brothers are cancer free, but one was identified as a carrier for Dyann Kief disease on prenatal screening.  Her oldest sister has a thyroid nodule and a breast mass both of which are followed.  Her other sister is healthy.  The patient's father is deceased and her mother is living.   The patient's mother has three brothers and four sisters.   One sister has a daughter who had an eye cancer at 75.  This sister and her daughter both have a goiter.  The maternal grandparents are deceased from non cancer related issues.   The patient's father died at 83.  He had four brothers and five sisters.  One sister had an unknown cancer at 72.  Another sister had three children with cancer, two are unknown cancer and one had pancreatic cancer.  One brother had a daughter with an unknown cancer.  The patient's paternal grandparents are deceased from non cancer related issues.   Ms. Wojahn is unaware of previous family history of genetic testing for hereditary cancer risks. Patient's maternal ancestors are of African American descent, and paternal ancestors are of African American descent. There is possible reported Ashkenazi Jewish ancestry. There is no known consanguinity.       GENETIC TEST RESULTS: Genetic testing reported out on February 19, 2019 through the Multi-cancer panel found no pathogenic mutations. The Multi-Gene Panel offered by [facility] includes sequencing and/or deletion duplication testing of the following 85 genes: AIP, ALK, APC, ATM, AXIN2,BAP1,  BARD1, BLM, BMPR1A, BRCA1, BRCA2, BRIP1, CASR, CDC73, CDH1, CDK4, CDKN1B, CDKN1C, CDKN2A (p14ARF), CDKN2A (p16INK4a), CEBPA, CHEK2, CTNNA1, DICER1, DIS3L2, EGFR (c.2369C>T, p.Thr790Met variant only), EPCAM (  Deletion/duplication testing only), FH, FLCN, GATA2, GPC3, GREM1 (Promoter region deletion/duplication testing only), HOXB13 (c.251G>A, p.Gly84Glu), HRAS, KIT, MAX, MEN1, MET, MITF (c.952G>A, p.Glu318Lys variant only), MLH1, MSH2, MSH3, MSH6, MUTYH, NBN, NF1, NF2, NTHL1, PALB2, PDGFRA, PHOX2B, PMS2, POLD1, POLE, POT1, PRKAR1A, PTCH1, PTEN, RAD50, RAD51C, RAD51D, RB1, RECQL4, RET, RNF43, RUNX1, SDHAF2, SDHA (sequence changes only), SDHB, SDHC, SDHD, SMAD4, SMARCA4, SMARCB1, SMARCE1, STK11, SUFU, TERC, TERT, TMEM127, TP53, TSC1, TSC2, VHL, WRN and WT1.  The test report has been scanned into EPIC and  is located under the Molecular Pathology section of the Results Review tab.  A portion of the result report is included below for reference.     We discussed with Ms. Walt that because current genetic testing is not perfect, it is possible there may be a gene mutation in one of these genes that current testing cannot detect, but that chance is small.  We also discussed, that there could be another gene that has not yet been discovered, or that we have not yet tested, that is responsible for the cancer diagnoses in the family. It is also possible there is a hereditary cause for the cancer in the family that Ms. Stecher did not inherit and therefore was not identified in her testing.  Therefore, it is important to remain in touch with cancer genetics in the future so that we can continue to offer Ms. Sedlak the most up to date genetic testing.   Genetic testing did identify a variant of uncertain significance (VUS) was identified in the TERT gene called c.483G>A.  At this time, it is unknown if this variant is associated with increased cancer risk or if this is a normal finding, but most variants such as this get reclassified to being inconsequential. It should not be used to make medical management decisions. With time, we suspect the lab will determine the significance of this variant, if any. If we do learn more about it, we will try to contact Ms. Linck to discuss it further. However, it is important to stay in touch with [place] periodically and keep the address and phone number up to date.  UPDATE: TERT c.483G>A (Silent) VUS has been reclassified as Likely Benign.  The amended report date is February 07, 2021.  ADDITIONAL GENETIC TESTING: We discussed with Ms. Dohrmann that her genetic testing was fairly extensive.  If there are genes identified to increase cancer risk that can be analyzed in the future, we would be happy to discuss and coordinate this testing at that time.    CANCER SCREENING RECOMMENDATIONS: Ms.  Rojek test result is considered negative (normal).  This means that we have not identified a hereditary cause for her personal and family history of cancer at this time. Most cancers happen by chance and this negative test suggests that her cancer may fall into this category.    While reassuring, this does not definitively rule out a hereditary predisposition to cancer. It is still possible that there could be genetic mutations that are undetectable by current technology. There could be genetic mutations in genes that have not been tested or identified to increase cancer risk.  Therefore, it is recommended she continue to follow the cancer management and screening guidelines provided by her oncology and primary healthcare provider.   An individual's cancer risk and medical management are not determined by genetic test results alone. Overall cancer risk assessment incorporates additional factors, including personal medical history, family history, and any available genetic information that may result in a personalized  plan for cancer prevention and surveillance  RECOMMENDATIONS FOR FAMILY MEMBERS:  Individuals in this family might be at some increased risk of developing cancer, over the general population risk, simply due to the family history of cancer.  We recommended women in this family have a yearly mammogram beginning at age 62, or 60 years younger than the earliest onset of cancer, an annual clinical breast exam, and perform monthly breast self-exams. Women in this family should also have a gynecological exam as recommended by their primary provider. All family members should have a colonoscopy by age 66.  FOLLOW-UP: Lastly, we discussed with Ms. Bohan that cancer genetics is a rapidly advancing field and it is possible that new genetic tests will be appropriate for her and/or her family members in the future. We encouraged her to remain in contact with cancer genetics on an annual basis so we can  update her personal and family histories and let her know of advances in cancer genetics that may benefit this family.   Our contact number was provided. Ms. Annunziato questions were answered to her satisfaction, and she knows she is welcome to call us at anytime with additional questions or concerns.   Roma Kayser, [place], [facility] Licensed, Certified Genetic Counselor [email or web address]

## 2019-02-26 ENCOUNTER — Encounter: Payer: Self-pay | Admitting: Internal Medicine

## 2019-03-01 ENCOUNTER — Other Ambulatory Visit (HOSPITAL_COMMUNITY)
Admission: RE | Admit: 2019-03-01 | Discharge: 2019-03-01 | Disposition: A | Discharge status: Home / Self Care | Payer: BC Managed Care – PPO | Source: Ambulatory Visit | Attending: General Surgery | Admitting: General Surgery

## 2019-03-01 DIAGNOSIS — Z20828 Contact with and (suspected) exposure to other viral communicable diseases: Secondary | ICD-10-CM | POA: Diagnosis not present

## 2019-03-01 DIAGNOSIS — Z01812 Encounter for preprocedural laboratory examination: Secondary | ICD-10-CM | POA: Insufficient documentation

## 2019-03-01 NOTE — Progress Notes (Signed)
[facility] [place], [place] [facility] [address] [place] [address] Phone: [phone] Fax: [phone]  [facility] [place] [place] [facility] [address] Phone: [phone] Fax: [phone]  [facility] [address] [place] [address] Phone: [phone] Fax: [phone]      Your procedure is scheduled on September 24  Report to [facility]" at 0530 A.M., and check in at the Admitting office.  Call this number if you have problems the morning of surgery:  [phone]  Call [phone] if you have any questions prior to your surgery date Monday-Friday 8am-4pm    Remember:  Do not eat after midnight the night before your surgery  You may drink clear liquids until 0430 am the morning of your surgery.   Clear liquids allowed are: Water, Non-Citrus Juices (without pulp), Carbonated Beverages, Clear Tea, Black Coffee Only, and Gatorade    Take these medicines the morning of surgery with A SIP OF WATER  acetaminophen (TYLENOL) if needed  7 days prior to surgery STOP taking any Aspirin (unless otherwise instructed by your surgeon), Aleve, Naproxen, Ibuprofen, Motrin, Advil, Goody's, BC's, all herbal medications, fish oil, and all vitamins.   WHAT DO I DO ABOUT MY DIABETES MEDICATION?   Marland Kitchen Do not take oral diabetes medicines (pills) the morning of surgery. metFORMIN (GLUCOPHAGE-XR)  . THE NIGHT BEFORE SURGERY, take 50% of your regular Insulin Degludec (TRESIBA FLEXTOUCH [facility]) dose     . DO NOT TAKE insulin lispro (HUMALOG KWIKPEN) the day of surgery unless your blood sugar is over 220 then take 1/2 of your normal dose  . If your CBG is greater than 220 mg/dL, you may take  of your sliding scale (correction) dose of insulin. insulin lispro (HUMALOG  KWIKPEN)   How to Manage Your Diabetes Before and After Surgery  Why is it important to control my blood sugar before and after surgery? . Improving blood sugar levels before and after surgery helps healing and can limit problems. . A way of improving blood sugar control is eating a healthy diet by: o  Eating less sugar and carbohydrates o  Increasing activity/exercise o  Talking with your doctor about reaching your blood sugar goals . High blood sugars (greater than 180 mg/dL) can raise your risk of infections and slow your recovery, so you will need to focus on controlling your diabetes during the weeks before surgery. . Make sure that the doctor who takes care of your diabetes knows about your planned surgery including the date and location.  How do I manage my blood sugar before surgery? . Check your blood sugar at least 4 times a day, starting 2 days before surgery, to make sure that the level is not too high or low. o Check your blood sugar the morning of your surgery when you wake up and every 2 hours until you get to the [facility]. . If your blood sugar is less than 70 mg/dL, you will need to treat for low blood sugar: o Do not take insulin. o Treat a low blood sugar (less than 70 mg/dL) with  cup of clear juice (cranberry or apple), 4 glucose tablets, OR glucose gel. o Recheck blood sugar in 15 minutes after treatment (to make sure  it is greater than 70 mg/dL). If your blood sugar is not greater than 70 mg/dL on recheck, call [phone] for further instructions. . Report your blood sugar to the [facility] nurse when you get to [facility].  . If you are admitted to the [facility] after surgery: o Your blood sugar will be checked by the staff and you will probably be given insulin after surgery (instead of oral diabetes medicines) to make sure you have good blood sugar levels. o The goal for blood sugar control after surgery is 80-180 mg/dL.    The Morning of Surgery  Do  not wear jewelry, make-up or nail polish.  Do not wear lotions, powders, or perfumes/colognes, or deodorant  Do not shave 48 hours prior to surgery.   Do not bring valuables to the [facility].  [facility] is not responsible for any belongings or valuables.  If you are a smoker, DO NOT Smoke 24 hours prior to surgery IF you wear a CPAP at night please bring your mask, tubing, and machine the morning of surgery   Remember that you must have someone to transport you home after your surgery, and remain with you for 24 hours if you are discharged the same day.   Contacts, glasses, hearing aids, dentures or bridgework may not be worn into surgery.    Leave your suitcase in the car.  After surgery it may be brought to your room.  For patients admitted to the [facility], discharge time will be determined by your treatment team.  Patients discharged the day of surgery will not be allowed to drive home.    Special instructions:   [facility] Preparing For [facility]  Before surgery, you can play an important role. Because skin is not sterile, your skin needs to be as free of germs as possible. You can reduce the number of germs on your skin by washing with CHG (chlorahexidine gluconate) Soap before surgery.  CHG is an antiseptic cleaner which kills germs and bonds with the skin to continue killing germs even after washing.    Oral Hygiene is also important to reduce your risk of infection.  Remember - BRUSH YOUR TEETH THE MORNING OF SURGERY WITH YOUR REGULAR TOOTHPASTE  Please do not use if you have an allergy to CHG or antibacterial soaps. If your skin becomes reddened/irritated stop using the CHG.  Do not shave (including legs and underarms) for at least 48 hours prior to first CHG shower. It is OK to shave your face.  Please follow these instructions carefully.   1. Shower the NIGHT BEFORE SURGERY and the MORNING OF SURGERY with CHG Soap.   2. If you chose to wash your hair, wash your hair  first as usual with your normal shampoo.  3. After you shampoo, rinse your hair and body thoroughly to remove the shampoo.  4. Use CHG as you would any other liquid soap. You can apply CHG directly to the skin and wash gently with a scrungie or a clean washcloth.   5. Apply the CHG Soap to your body ONLY FROM THE NECK DOWN.  Do not use on open wounds or open sores. Avoid contact with your eyes, ears, mouth and genitals (private parts). Wash Face and genitals (private parts)  with your normal soap.   6. Wash thoroughly, paying special attention to the area where your surgery will be performed.  7. Thoroughly rinse your body with warm water from the neck down.  8. DO NOT shower/wash with  your normal soap after using and rinsing off the CHG Soap.  9. Pat yourself dry with a CLEAN TOWEL.  10. Wear CLEAN PAJAMAS to bed the night before surgery, wear comfortable clothes the morning of surgery  11. Place CLEAN SHEETS on your bed the night of your first shower and DO NOT SLEEP WITH PETS.    Day of Surgery:  Do not apply any deodorants/lotions. Please shower the morning of surgery with the CHG soap  Please wear clean clothes to the [facility].   Remember to brush your teeth WITH YOUR REGULAR TOOTHPASTE.   Please read over the following fact sheets that you were given.

## 2019-03-02 ENCOUNTER — Encounter (HOSPITAL_COMMUNITY): Payer: Self-pay

## 2019-03-02 ENCOUNTER — Encounter (HOSPITAL_COMMUNITY)
Admission: RE | Admit: 2019-03-02 | Discharge: 2019-03-02 | Disposition: A | Discharge status: Home / Self Care | Payer: BC Managed Care – PPO | Source: Ambulatory Visit | Attending: General Surgery | Admitting: General Surgery

## 2019-03-02 ENCOUNTER — Other Ambulatory Visit: Payer: Self-pay

## 2019-03-02 DIAGNOSIS — C254 Malignant neoplasm of endocrine pancreas: Secondary | ICD-10-CM | POA: Insufficient documentation

## 2019-03-02 DIAGNOSIS — Z01812 Encounter for preprocedural laboratory examination: Secondary | ICD-10-CM | POA: Insufficient documentation

## 2019-03-02 HISTORY — DX: Malignant (primary) neoplasm, unspecified: C80.1

## 2019-03-02 LAB — CBC WITH DIFFERENTIAL/PLATELET
Abs Immature Granulocytes: 0.01 10*3/uL (ref 0.00–0.07)
Basophils Absolute: 0.1 10*3/uL (ref 0.0–0.1)
Basophils Relative: 1 %
Eosinophils Absolute: 0.1 10*3/uL (ref 0.0–0.5)
Eosinophils Relative: 2 %
HCT: 40.5 % (ref 36.0–46.0)
Hemoglobin: 13.2 g/dL (ref 12.0–15.0)
Immature Granulocytes: 0 %
Lymphocytes Relative: 47 %
Lymphs Abs: 2.7 10*3/uL (ref 0.7–4.0)
MCH: 29.6 pg (ref 26.0–34.0)
MCHC: 32.6 g/dL (ref 30.0–36.0)
MCV: 90.8 fL (ref 80.0–100.0)
Monocytes Absolute: 0.4 10*3/uL (ref 0.1–1.0)
Monocytes Relative: 6 %
Neutro Abs: 2.6 10*3/uL (ref 1.7–7.7)
Neutrophils Relative %: 44 %
Platelets: 399 10*3/uL (ref 150–400)
RBC: 4.46 MIL/uL (ref 3.87–5.11)
RDW: 12.5 % (ref 11.5–15.5)
WBC: 5.8 10*3/uL (ref 4.0–10.5)
nRBC: 0 % (ref 0.0–0.2)

## 2019-03-02 LAB — COMPREHENSIVE METABOLIC PANEL
ALT: 18 U/L (ref 0–44)
AST: 14 U/L — ABNORMAL LOW (ref 15–41)
Albumin: 3.7 g/dL (ref 3.5–5.0)
Alkaline Phosphatase: 74 U/L (ref 38–126)
Anion gap: 11 (ref 5–15)
BUN: 16 mg/dL (ref 6–20)
CO2: 24 mmol/L (ref 22–32)
Calcium: 10 mg/dL (ref 8.9–10.3)
Chloride: 104 mmol/L (ref 98–111)
Creatinine, Ser: 0.91 mg/dL (ref 0.44–1.00)
GFR calc Af Amer: >60 mL/min (ref >60)
GFR calc non Af Amer: >60 mL/min (ref >60)
Glucose, Bld: 189 mg/dL — ABNORMAL HIGH (ref 70–99)
Potassium: 4 mmol/L (ref 3.5–5.1)
Sodium: 139 mmol/L (ref 135–145)
Total Bilirubin: 0.5 mg/dL (ref 0.3–1.2)
Total Protein: 7.6 g/dL (ref 6.5–8.1)

## 2019-03-02 LAB — GLUCOSE, CAPILLARY: Glucose-Capillary: 193 mg/dL — ABNORMAL HIGH (ref 70–99)

## 2019-03-02 LAB — ABO/RH: ABO/RH(D): AB POS

## 2019-03-02 LAB — PROTIME-INR
INR: 1.1 (ref 0.8–1.2)
Prothrombin Time: 13.7 seconds (ref 11.4–15.2)

## 2019-03-02 LAB — PREPARE RBC (CROSSMATCH)

## 2019-03-02 NOTE — Progress Notes (Signed)
PCP - Glendale Chard Cardiologist - denies  Chest x-ray - n/a EKG - 07-11-18  DM - yes, Type 2 Fasting Blood Sugar - 90's-110  ERAS Protcol - yes, & bowel prep PRE-SURGERY Ensure - not ordered  COVID TEST: 03-01-19 (results pending)   Anesthesia review: n/a  Patient denies shortness of breath, fever, cough and chest pain at PAT appointment   Patient verbalized understanding of instructions that were given to them at the PAT appointment. Patient was also instructed that they will need to review over the PAT instructions again at home before surgery.

## 2019-03-03 ENCOUNTER — Other Ambulatory Visit: Payer: Self-pay

## 2019-03-03 ENCOUNTER — Ambulatory Visit
Admission: RE | Admit: 2019-03-03 | Discharge: 2019-03-03 | Disposition: A | Discharge status: Home / Self Care | Payer: BC Managed Care – PPO | Source: Ambulatory Visit | Attending: General Surgery | Admitting: General Surgery

## 2019-03-03 DIAGNOSIS — C7A8 Other malignant neuroendocrine tumors: Secondary | ICD-10-CM | POA: Diagnosis not present

## 2019-03-03 DIAGNOSIS — C254 Malignant neoplasm of endocrine pancreas: Secondary | ICD-10-CM | POA: Diagnosis not present

## 2019-03-03 LAB — NOVEL CORONAVIRUS, NAA (HOSP ORDER, SEND-OUT TO REF LAB; TAT 18-24 HRS): SARS-CoV-2, NAA: NOT DETECTED

## 2019-03-03 LAB — HEMOGLOBIN A1C
Hgb A1c MFr Bld: 8.9 % — ABNORMAL HIGH (ref 4.8–5.6)
Mean Plasma Glucose: 209 mg/dL

## 2019-03-03 MED ORDER — GALLIUM GA 68 DOTATATE IV KIT
5.5000 | PACK | Freq: Once | INTRAVENOUS | Status: AC | PRN
  Administered 2019-03-03: 5.5 via INTRAVENOUS

## 2019-03-03 NOTE — H&P (Signed)
[facility]  Location: [facility] Patient #: [id number] DOB: 06-Jun-1966 Single / Language: Cleophus Molt / Race: Black or African American Female   History of Present Illness The patient is a 53 year old female who presents with pancreatic cancer. Pt is a 53 yo F who is referred for consultation by Dr. Benson Norway for new diagnosis of malignant mass of pancreatic head 12/2018. She presented to PCP with fatigue 11/2018. She was found to have a UTI and was treated. Very shortly thereafter, she became jaundiced and developed pruritus. She lost around 10 pounds during this process due to lack of appetite. She was seen to have dilated ducts on u/s. MRI confirmed this and showed a 2.5 cm pancreatic head mass. She was referred to Dr. Benson Norway and underwent EUS/ERCP. The mass was worrisome for adenoca on u/s appearance and there were two lymph nodes seen. 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 She has been able to regain some of the weight back. She has not had diarrhea. She is diabetic, but this is not a new dx. She also has a first cousin who died of pancreatic cancer and a different first cousin (same side of family) who died of metastatic cancer of unknown primary.  She is accompanied by her brother in person and her sister by face time.     [facility] 12/18/2018 pancreatic and biliary dilation wtih 2.5 cm mass worrisome for adenoca.  01/01/2019 An irregular mass was identified in the pancreatic head. The mass was hypoechoic. The mass measured 25 mm by 25 mm in maximal cross-sectional diameter. The outer margins were irregular. Adjacent to the head of the pancreas were two lymph nodes. The long axis measured 10 mm. An intact interface was seen between the mass and the superior mesenteric artery, celiac trunk, portal vein, superior mesenteric vein and splenoportal confluence suggesting a lack of invasion. The remainder of the pancreas was examined. The  endosonographic appearance of parenchyma and the upstream pancreatic duct indicated duct dilation. Fine needle aspiration for cytology was performed. Color Doppler imaging was utilized prior to needle puncture to confirm a lack of significant vascular structures within the needle path. Five passes were made with the 25 gauge needle using a transduodenal approach. A stylet was used. A cytotechnologist was present to evaluate the adequacy of the specimen. The cellularity of the specimen was adequate. Final cytology results are pending. The mass was initially difficult to locate as it was mildly hypoechoic. In certain views it appeared to be isoechoic with the rest of the pancreas. Five 25 gauge FNA needle passes were performed and excellent samples were obtained. It was technically challenging to obtain the specimens as the lesion was highly vascular. Only narrow windows for FNA were possible. Two lymph nodes were identified. FNA was not possible as there was too much surrounding vasculature. There was no invasion into any vascular structures.   cytology 01/01/2019 [facility] FINE NEEDLE ASPIRATION, ENDOSCOPIC, PANCREAS (SPECIMEN 1 OF 1 COLLECTED 01/01/19): NEOPLASTIC CELLS CONSISTENT WITH WELL DIFFERENTIATED NEUROENDOCRINE TUMOR.    Past Surgical History Oral Surgery   Diagnostic Studies History Colonoscopy  never Mammogram  within last year Pap Smear  1-5 years ago  Allergies  No Known Allergies   Allergies Reconciled   Medication History Tresiba FlexTouch (100UNIT/ML Soln Pen-inj, Subcutaneous) Active. HumaLOG KwikPen (100UNIT/ML Soln Pen-inj, Subcutaneous) Active. Cephalexin (500MG  Capsule, Oral) Active. Lisinopril-hydroCHLOROthiazide (10-12.5MG  Tablet, Oral) Active. Potassium Chloride Crys ER (20MEQ Tablet ER, Oral) Active. Medications Reconciled  Social History  Alcohol use  Remotely quit alcohol use. Caffeine use  Carbonated beverages, Tea. No drug use   Tobacco use  Never smoker.  Family History  Diabetes Mellitus  Brother, Family Members In General, Father, Mother. Heart Disease  Brother, Mother. Heart disease in female family member before age 60  Hypertension  Brother, Father, Mother. Kidney Disease  Family Members In General, Father. Malignant Neoplasm Of Pancreas  Family Members In General. Thyroid problems  Family Members In General, Sister.  Pregnancy / Birth History  Age at menarche  50 years. Age of menopause  8-50 Gravida  0 Irregular periods  Para  0  Other Problems  Diabetes Mellitus  High blood pressure  Hypercholesterolemia     Review of Systems  General Present- Fatigue. Not Present- Appetite Loss, Chills, Fever, Night Sweats, Weight Gain and Weight Loss. Skin Not Present- Change in Wart/Mole, Dryness, Hives, Jaundice, New Lesions, Non-Healing Wounds, Rash and Ulcer. HEENT Present- Seasonal Allergies. Not Present- Earache, Hearing Loss, Hoarseness, Nose Bleed, Oral Ulcers, Ringing in the Ears, Sinus Pain, Sore Throat, Visual Disturbances, Wears glasses/contact lenses and Yellow Eyes. Respiratory Not Present- Bloody sputum, Chronic Cough, Difficulty Breathing, Snoring and Wheezing. Breast Not Present- Breast Mass, Breast Pain, Nipple Discharge and Skin Changes. Cardiovascular Not Present- Chest Pain, Difficulty Breathing Lying Down, Leg Cramps, Palpitations, Rapid Heart Rate, Shortness of Breath and Swelling of Extremities. Gastrointestinal Present- Excessive gas. Not Present- Abdominal Pain, Bloating, Bloody Stool, Change in Bowel Habits, Chronic diarrhea, Constipation, Difficulty Swallowing, Gets full quickly at meals, Hemorrhoids, Indigestion, Nausea, Rectal Pain and Vomiting. Female Genitourinary Not Present- Frequency, Nocturia, Painful Urination, Pelvic Pain and Urgency. Musculoskeletal Not Present- Back Pain, Joint Pain, Joint Stiffness, Muscle Pain, Muscle Weakness and Swelling of  Extremities. Neurological Not Present- Decreased Memory, Fainting, Headaches, Numbness, Seizures, Tingling, Tremor, Trouble walking and Weakness. Psychiatric Not Present- Anxiety, Bipolar, Change in Sleep Pattern, Depression, Fearful and Frequent crying. Endocrine Not Present- Cold Intolerance, Excessive Hunger, Hair Changes, Heat Intolerance, Hot flashes and New Diabetes. Hematology Not Present- Blood Thinners, Easy Bruising, Excessive bleeding, Gland problems, HIV and Persistent Infections.  Vitals Weight: 214.4 lb Height: 68in Body Surface Area: 2.11 m Body Mass Index: 32.6 kg/m  Temp.: 98.2F (Temporal)  Pulse: 114 (Regular)  P.OX: 98% (Room air) BP: 122/84(Sitting, Left Arm, Standard)       Physical Exam General Mental Status-Alert. General Appearance-Consistent with stated age. Hydration-Well hydrated. Voice-Normal.  Head and Neck Head-normocephalic, atraumatic with no lesions or palpable masses. Trachea-midline. Thyroid Gland Characteristics - normal size and consistency.  Eye Eyeball - Bilateral-Extraocular movements intact. Sclera/Conjunctiva - Bilateral-No scleral icterus.  Chest and Lung Exam Chest and lung exam reveals -quiet, even and easy respiratory effort with no use of accessory muscles and on auscultation, normal breath sounds, no adventitious sounds and normal vocal resonance. Inspection Chest Wall - Normal. Back - normal.  Cardiovascular Cardiovascular examination reveals -normal heart sounds, regular rate and rhythm with no murmurs and normal pedal pulses bilaterally.  Abdomen Inspection Inspection of the abdomen reveals - No Hernias. Palpation/Percussion Palpation and Percussion of the abdomen reveal - Soft, Non Tender, No Rebound tenderness, No Rigidity (guarding) and No hepatosplenomegaly. Auscultation Auscultation of the abdomen reveals - Bowel sounds normal.  Neurologic Neurologic evaluation reveals  -alert and oriented x 3 with no impairment of recent or remote memory. Mental Status-Normal.  Musculoskeletal Global Assessment -Note: no gross deformities.  Normal Exam - Left-Upper Extremity Strength Normal and Lower Extremity Strength Normal. Normal Exam - Right-Upper Extremity Strength Normal and  Lower Extremity Strength Normal.  Lymphatic Head & Neck  General Head & Neck Lymphatics: Bilateral - Description - Normal. Axillary  General Axillary Region: Bilateral - Description - Normal. Tenderness - Non Tender. Femoral & Inguinal  Generalized Femoral & Inguinal Lymphatics: Bilateral - Description - No Generalized lymphadenopathy.    Assessment & Plan  PRIMARY MALIGNANT NEUROENDOCRINE NEOPLASM OF PANCREAS (C7A.8) Impression: This appears to be a non functioning tumor. As a neuroendocrine tumor, would plan to proceed to surgery. Will need to complete staging with CT chest. Given the concern for vascular involvement on MRI, will get CT pancreatic protocol to evaluate vessels.  I will send to genetics I will also send to oncology.  I discussed the surgery with the patient including diagrams of anatomy. I discussed the potential for diagnostic laparoscopy. In the case of pancreatic cancer, if spread of the disease is found, we will abort the procedure and not proceed with resection. The rationale for this was discussed with the patient. There has not been data to support resection of Stage IV disease in terms of survival benefit.  We discussed possible complications including: Potential of aborting procedure if tumor is invading the superior mesenteric or hepatic arteries Bleeding Infection and possible wound complications such as hernia Damage to adjacent structures Leak of anastamoses, primarily pancreatic Possible need for other procedures Possible prolonged nausea with possible need for external feeding. Possible prolonged hospital stay. Possible development of  diabetes or worsening of current diabetes. Possible pancreatic exocrine insufficiency Prolonged fatigue/weakness/appetite Possible early recurrence of cancer   The patient understands and wishes to proceed. The patient has been advised to turn in disability paperwork to our office.  Current Plans Pt Education - flb whipple pt info You are being scheduled for surgery- Our schedulers will call you.  You should hear from our office's scheduling department within 5 working days about the location, date, and time of surgery. We try to make accommodations for patient's preferences in scheduling surgery, but sometimes the OR schedule or the surgeon's schedule prevents [place] from making those accommodations.  If you have not heard from our office [phone]) in 5 working days, call the office and ask for your surgeon's nurse.  If you have other questions about your diagnosis, plan, or surgery, call the office and ask for your surgeon's nurse.

## 2019-03-03 NOTE — Anesthesia Preprocedure Evaluation (Addendum)
Anesthesia Evaluation  Patient identified by MRN, date of birth, ID band Patient awake    Reviewed: Allergy & Precautions, NPO status , Patient's Chart, lab work & pertinent test results  History of Anesthesia Complications (+) PONV and history of anesthetic complications  Airway Mallampati: II  TM Distance: >3 FB Neck ROM: Full    Dental no notable dental hx.    Pulmonary neg pulmonary ROS,    Pulmonary exam normal        Cardiovascular hypertension, Pt. on medications Normal cardiovascular exam     Neuro/Psych negative neurological ROS  negative psych ROS   GI/Hepatic Neg liver ROS, Pancreatic ca   Endo/Other  diabetes, Type 2, Insulin Dependent, Oral Hypoglycemic Agents  Renal/GU negative Renal ROS  negative genitourinary   Musculoskeletal negative musculoskeletal ROS (+)   Abdominal   Peds  Hematology negative hematology ROS (+)   Anesthesia Other Findings Day of surgery medications reviewed with patient.  Reproductive/Obstetrics negative OB ROS                            Anesthesia Physical Anesthesia Plan  ASA: III  Anesthesia Plan: General   Post-op Pain Management: GA combined w/ Regional for post-op pain   Induction: Intravenous  PONV Risk Score and Plan: 4 or greater and Treatment may vary due to age or medical condition, Ondansetron, Dexamethasone, Midazolam and Scopolamine patch - Pre-op  Airway Management Planned: Oral ETT  Additional Equipment: Arterial line  Intra-op Plan:   Post-operative Plan: Possible Post-op intubation/ventilation  Informed Consent: I have reviewed the patients History and Physical, chart, labs and discussed the procedure including the risks, benefits and alternatives for the proposed anesthesia with the patient or authorized representative who has indicated his/her understanding and acceptance.     Dental advisory given  Plan  Discussed with: CRNA  Anesthesia Plan Comments: (Epidural for postop pain management. If not able to place PIVx2 >18g will need CVC.)      Anesthesia Quick Evaluation

## 2019-03-04 ENCOUNTER — Encounter (HOSPITAL_COMMUNITY): Payer: Self-pay

## 2019-03-04 ENCOUNTER — Inpatient Hospital Stay (HOSPITAL_COMMUNITY): Payer: BC Managed Care – PPO | Admitting: Certified Registered Nurse Anesthetist

## 2019-03-04 ENCOUNTER — Encounter (HOSPITAL_COMMUNITY)
Admission: RE | Disposition: A | Discharge status: Home / Self Care | Payer: Self-pay | Source: Home / Self Care | Attending: General Surgery

## 2019-03-04 ENCOUNTER — Inpatient Hospital Stay (HOSPITAL_COMMUNITY)
Admission: RE | Admit: 2019-03-04 | Discharge: 2019-03-14 | DRG: 406 | Disposition: A | Discharge status: Home / Self Care | Payer: BC Managed Care – PPO | Attending: General Surgery | Admitting: General Surgery

## 2019-03-04 ENCOUNTER — Other Ambulatory Visit: Payer: Self-pay

## 2019-03-04 ENCOUNTER — Ambulatory Visit (HOSPITAL_COMMUNITY): Payer: BC Managed Care – PPO

## 2019-03-04 DIAGNOSIS — Z8279 Family history of other congenital malformations, deformations and chromosomal abnormalities: Secondary | ICD-10-CM

## 2019-03-04 DIAGNOSIS — Z8249 Family history of ischemic heart disease and other diseases of the circulatory system: Secondary | ICD-10-CM | POA: Diagnosis not present

## 2019-03-04 DIAGNOSIS — Z91018 Allergy to other foods: Secondary | ICD-10-CM

## 2019-03-04 DIAGNOSIS — Z23 Encounter for immunization: Secondary | ICD-10-CM

## 2019-03-04 DIAGNOSIS — E119 Type 2 diabetes mellitus without complications: Secondary | ICD-10-CM | POA: Diagnosis present

## 2019-03-04 DIAGNOSIS — I10 Essential (primary) hypertension: Secondary | ICD-10-CM | POA: Diagnosis not present

## 2019-03-04 DIAGNOSIS — Z20828 Contact with and (suspected) exposure to other viral communicable diseases: Secondary | ICD-10-CM | POA: Diagnosis present

## 2019-03-04 DIAGNOSIS — N2889 Other specified disorders of kidney and ureter: Secondary | ICD-10-CM | POA: Diagnosis not present

## 2019-03-04 DIAGNOSIS — D62 Acute posthemorrhagic anemia: Secondary | ICD-10-CM | POA: Diagnosis not present

## 2019-03-04 DIAGNOSIS — E7800 Pure hypercholesterolemia, unspecified: Secondary | ICD-10-CM | POA: Diagnosis present

## 2019-03-04 DIAGNOSIS — C259 Malignant neoplasm of pancreas, unspecified: Secondary | ICD-10-CM | POA: Diagnosis present

## 2019-03-04 DIAGNOSIS — Z8349 Family history of other endocrine, nutritional and metabolic diseases: Secondary | ICD-10-CM

## 2019-03-04 DIAGNOSIS — C7A8 Other malignant neuroendocrine tumors: Secondary | ICD-10-CM | POA: Diagnosis not present

## 2019-03-04 DIAGNOSIS — Z833 Family history of diabetes mellitus: Secondary | ICD-10-CM | POA: Diagnosis not present

## 2019-03-04 DIAGNOSIS — Z808 Family history of malignant neoplasm of other organs or systems: Secondary | ICD-10-CM | POA: Diagnosis not present

## 2019-03-04 DIAGNOSIS — C7A010 Malignant carcinoid tumor of the duodenum: Secondary | ICD-10-CM | POA: Diagnosis not present

## 2019-03-04 DIAGNOSIS — C772 Secondary and unspecified malignant neoplasm of intra-abdominal lymph nodes: Secondary | ICD-10-CM | POA: Diagnosis not present

## 2019-03-04 DIAGNOSIS — K219 Gastro-esophageal reflux disease without esophagitis: Secondary | ICD-10-CM | POA: Diagnosis not present

## 2019-03-04 DIAGNOSIS — C23 Malignant neoplasm of gallbladder: Secondary | ICD-10-CM | POA: Diagnosis not present

## 2019-03-04 DIAGNOSIS — K8689 Other specified diseases of pancreas: Secondary | ICD-10-CM | POA: Diagnosis not present

## 2019-03-04 DIAGNOSIS — Z794 Long term (current) use of insulin: Secondary | ICD-10-CM | POA: Diagnosis not present

## 2019-03-04 DIAGNOSIS — G8918 Other acute postprocedural pain: Secondary | ICD-10-CM | POA: Diagnosis not present

## 2019-03-04 DIAGNOSIS — Z809 Family history of malignant neoplasm, unspecified: Secondary | ICD-10-CM

## 2019-03-04 HISTORY — PX: PARTIAL NEPHRECTOMY: SHX414

## 2019-03-04 HISTORY — PX: LAPAROSCOPY: SHX197

## 2019-03-04 HISTORY — PX: WHIPPLE PROCEDURE: SHX2667

## 2019-03-04 LAB — GLUCOSE, CAPILLARY
Glucose-Capillary: 167 mg/dL — ABNORMAL HIGH (ref 70–99)
Glucose-Capillary: 182 mg/dL — ABNORMAL HIGH (ref 70–99)
Glucose-Capillary: 197 mg/dL — ABNORMAL HIGH (ref 70–99)
Glucose-Capillary: 198 mg/dL — ABNORMAL HIGH (ref 70–99)
Glucose-Capillary: 223 mg/dL — ABNORMAL HIGH (ref 70–99)
Glucose-Capillary: 229 mg/dL — ABNORMAL HIGH (ref 70–99)
Glucose-Capillary: 244 mg/dL — ABNORMAL HIGH (ref 70–99)
Glucose-Capillary: 244 mg/dL — ABNORMAL HIGH (ref 70–99)

## 2019-03-04 LAB — MRSA PCR SCREENING: MRSA by PCR: NEGATIVE

## 2019-03-04 LAB — POCT I-STAT 7, (LYTES, BLD GAS, ICA,H+H)
Acid-base deficit: 2 mmol/L (ref 0.0–2.0)
Bicarbonate: 23.9 mmol/L (ref 20.0–28.0)
Calcium, Ion: 1.2 mmol/L (ref 1.15–1.40)
HCT: 33 % — ABNORMAL LOW (ref 36.0–46.0)
Hemoglobin: 11.2 g/dL — ABNORMAL LOW (ref 12.0–15.0)
O2 Saturation: 100 %
Potassium: 3.6 mmol/L (ref 3.5–5.1)
Sodium: 137 mmol/L (ref 135–145)
TCO2: 25 mmol/L (ref 22–32)
pCO2 arterial: 44 mmHg (ref 32.0–48.0)
pH, Arterial: 7.344 — ABNORMAL LOW (ref 7.350–7.450)
pO2, Arterial: 233 mmHg — ABNORMAL HIGH (ref 83.0–108.0)

## 2019-03-04 LAB — POCT I-STAT 4, (NA,K, GLUC, HGB,HCT)
Glucose, Bld: 284 mg/dL — ABNORMAL HIGH (ref 70–99)
HCT: 33 % — ABNORMAL LOW (ref 36.0–46.0)
Hemoglobin: 11.2 g/dL — ABNORMAL LOW (ref 12.0–15.0)
Potassium: 3.3 mmol/L — ABNORMAL LOW (ref 3.5–5.1)
Sodium: 136 mmol/L (ref 135–145)

## 2019-03-04 SURGERY — LAPAROSCOPY, DIAGNOSTIC
Anesthesia: General | Site: Abdomen | Laterality: Right

## 2019-03-04 MED ORDER — MIDAZOLAM HCL 2 MG/2ML IJ SOLN
INTRAMUSCULAR | Status: AC
  Filled 2019-03-04: qty 2

## 2019-03-04 MED ORDER — CHLORHEXIDINE GLUCONATE CLOTH 2 % EX PADS
6.0000 | MEDICATED_PAD | Freq: Every day | CUTANEOUS | Status: DC
  Administered 2019-03-04 – 2019-03-14 (×10): 6 via TOPICAL

## 2019-03-04 MED ORDER — GABAPENTIN 300 MG PO CAPS
300.0000 mg | ORAL_CAPSULE | ORAL | Status: AC
  Administered 2019-03-04: 06:00:00 300 mg via ORAL

## 2019-03-04 MED ORDER — PHENYLEPHRINE 40 MCG/ML (10ML) SYRINGE FOR IV PUSH (FOR BLOOD PRESSURE SUPPORT)
PREFILLED_SYRINGE | INTRAVENOUS | Status: DC | PRN
  Administered 2019-03-04: 80 ug via INTRAVENOUS
  Administered 2019-03-04 (×4): 120 ug via INTRAVENOUS

## 2019-03-04 MED ORDER — PROPOFOL 10 MG/ML IV BOLUS
INTRAVENOUS | Status: DC | PRN
  Administered 2019-03-04: 180 mg via INTRAVENOUS
  Administered 2019-03-04: 20 mg via INTRAVENOUS

## 2019-03-04 MED ORDER — ACETAMINOPHEN 500 MG PO TABS
ORAL_TABLET | ORAL | Status: AC
  Administered 2019-03-04: 1000 mg via ORAL
  Filled 2019-03-04: qty 2

## 2019-03-04 MED ORDER — FENTANYL CITRATE (PF) 250 MCG/5ML IJ SOLN
INTRAMUSCULAR | Status: AC
  Filled 2019-03-04: qty 5

## 2019-03-04 MED ORDER — ROPIVACAINE HCL 2 MG/ML IJ SOLN
INTRAMUSCULAR | Status: DC | PRN
  Administered 2019-03-04: 8 mL/h via EPIDURAL

## 2019-03-04 MED ORDER — PROMETHAZINE HCL 25 MG/ML IJ SOLN: 6.2500 mg | INTRAMUSCULAR | Status: DC | PRN

## 2019-03-04 MED ORDER — 0.9 % SODIUM CHLORIDE (POUR BTL) OPTIME: TOPICAL | Status: DC | PRN

## 2019-03-04 MED ORDER — PHENYLEPHRINE HCL (PRESSORS) 10 MG/ML IV SOLN
INTRAVENOUS | Status: AC
  Filled 2019-03-04: qty 1

## 2019-03-04 MED ORDER — SODIUM CHLORIDE 0.9% FLUSH: 9.0000 mL | INTRAVENOUS | Status: DC | PRN

## 2019-03-04 MED ORDER — ALBUMIN HUMAN 5 % IV SOLN
12.5000 g | Freq: Once | INTRAVENOUS | Status: AC
  Administered 2019-03-04: 16:00:00 12.5 g via INTRAVENOUS

## 2019-03-04 MED ORDER — ROPIVACAINE HCL 2 MG/ML IJ SOLN
6.0000 mL/h | INTRAMUSCULAR | Status: AC
  Administered 2019-03-05 – 2019-03-09 (×4): 6 mL/h via EPIDURAL
  Filled 2019-03-04 (×7): qty 200

## 2019-03-04 MED ORDER — SCOPOLAMINE 1 MG/3DAYS TD PT72
MEDICATED_PATCH | TRANSDERMAL | Status: DC | PRN
  Administered 2019-03-04: 1 via TRANSDERMAL

## 2019-03-04 MED ORDER — HEMOSTATIC AGENTS (NO CHARGE) OPTIME
TOPICAL | Status: DC | PRN
  Administered 2019-03-04: 1 via TOPICAL

## 2019-03-04 MED ORDER — BUPIVACAINE-EPINEPHRINE (PF) 0.25% -1:200000 IJ SOLN
INTRAMUSCULAR | Status: AC
  Filled 2019-03-04: qty 30

## 2019-03-04 MED ORDER — DIPHENHYDRAMINE HCL 50 MG/ML IJ SOLN: 12.5000 mg | Freq: Four times a day (QID) | INTRAMUSCULAR | Status: DC | PRN

## 2019-03-04 MED ORDER — ROCURONIUM BROMIDE 10 MG/ML (PF) SYRINGE
PREFILLED_SYRINGE | INTRAVENOUS | Status: AC
  Filled 2019-03-04: qty 10

## 2019-03-04 MED ORDER — DIPHENHYDRAMINE HCL 12.5 MG/5ML PO ELIX: 12.5000 mg | ORAL_SOLUTION | Freq: Four times a day (QID) | ORAL | Status: DC | PRN

## 2019-03-04 MED ORDER — ACETAMINOPHEN 10 MG/ML IV SOLN
1000.0000 mg | Freq: Four times a day (QID) | INTRAVENOUS | Status: AC
  Administered 2019-03-04 – 2019-03-05 (×4): 1000 mg via INTRAVENOUS
  Filled 2019-03-04 (×4): qty 100

## 2019-03-04 MED ORDER — ROPIVACAINE HCL 2 MG/ML IJ SOLN
8.0000 mL/h | INTRAMUSCULAR | Status: DC
  Administered 2019-03-04: 8 mL/h via EPIDURAL

## 2019-03-04 MED ORDER — METHOCARBAMOL 1000 MG/10ML IJ SOLN
500.0000 mg | Freq: Three times a day (TID) | INTRAVENOUS | Status: DC | PRN
  Administered 2019-03-06 – 2019-03-09 (×3): 500 mg via INTRAVENOUS
  Filled 2019-03-04: qty 5
  Filled 2019-03-04 (×2): qty 500
  Filled 2019-03-04: qty 5

## 2019-03-04 MED ORDER — ONDANSETRON HCL 4 MG/2ML IJ SOLN: 4.0000 mg | Freq: Four times a day (QID) | INTRAMUSCULAR | Status: DC | PRN

## 2019-03-04 MED ORDER — INSULIN ASPART 100 UNIT/ML ~~LOC~~ SOLN
0.0000 [IU] | SUBCUTANEOUS | Status: DC
  Administered 2019-03-04: 20:00:00 5 [IU] via SUBCUTANEOUS
  Administered 2019-03-04: 3 [IU] via SUBCUTANEOUS
  Administered 2019-03-04: 5 [IU] via SUBCUTANEOUS
  Administered 2019-03-05 (×3): 3 [IU] via SUBCUTANEOUS
  Administered 2019-03-05 (×2): 2 [IU] via SUBCUTANEOUS
  Administered 2019-03-06: 5 [IU] via SUBCUTANEOUS
  Administered 2019-03-06: 2 [IU] via SUBCUTANEOUS
  Administered 2019-03-06: 3 [IU] via SUBCUTANEOUS
  Administered 2019-03-06 (×2): 2 [IU] via SUBCUTANEOUS
  Administered 2019-03-06 – 2019-03-07 (×3): 3 [IU] via SUBCUTANEOUS
  Administered 2019-03-07: 2 [IU] via SUBCUTANEOUS
  Administered 2019-03-07: 13:00:00 3 [IU] via SUBCUTANEOUS
  Administered 2019-03-07 – 2019-03-08 (×3): 2 [IU] via SUBCUTANEOUS
  Administered 2019-03-08 (×2): 3 [IU] via SUBCUTANEOUS
  Administered 2019-03-09: 5 [IU] via SUBCUTANEOUS
  Administered 2019-03-09 (×2): 2 [IU] via SUBCUTANEOUS
  Administered 2019-03-09 (×2): 3 [IU] via SUBCUTANEOUS
  Administered 2019-03-09 – 2019-03-10 (×2): 2 [IU] via SUBCUTANEOUS
  Administered 2019-03-10: 3 [IU] via SUBCUTANEOUS
  Administered 2019-03-10: 5 [IU] via SUBCUTANEOUS
  Administered 2019-03-10 – 2019-03-11 (×2): 3 [IU] via SUBCUTANEOUS
  Administered 2019-03-11: 5 [IU] via SUBCUTANEOUS
  Administered 2019-03-11: 2 [IU] via SUBCUTANEOUS
  Administered 2019-03-11: 3 [IU] via SUBCUTANEOUS
  Administered 2019-03-11: 5 [IU] via SUBCUTANEOUS
  Administered 2019-03-11: 2 [IU] via SUBCUTANEOUS
  Administered 2019-03-11: 3 [IU] via SUBCUTANEOUS
  Administered 2019-03-12 (×2): 5 [IU] via SUBCUTANEOUS
  Administered 2019-03-12: 3 [IU] via SUBCUTANEOUS
  Administered 2019-03-12: 5 [IU] via SUBCUTANEOUS
  Administered 2019-03-12: 3 [IU] via SUBCUTANEOUS
  Administered 2019-03-13: 5 [IU] via SUBCUTANEOUS
  Administered 2019-03-13: 2 [IU] via SUBCUTANEOUS
  Administered 2019-03-13: 5 [IU] via SUBCUTANEOUS
  Administered 2019-03-13 (×2): 3 [IU] via SUBCUTANEOUS
  Administered 2019-03-14 (×2): 2 [IU] via SUBCUTANEOUS
  Administered 2019-03-14: 5 [IU] via SUBCUTANEOUS

## 2019-03-04 MED ORDER — PANTOPRAZOLE SODIUM 40 MG IV SOLR
40.0000 mg | Freq: Every day | INTRAVENOUS | Status: DC
  Administered 2019-03-04 – 2019-03-08 (×5): 40 mg via INTRAVENOUS
  Filled 2019-03-04 (×5): qty 40

## 2019-03-04 MED ORDER — INSULIN GLARGINE 100 UNIT/ML ~~LOC~~ SOLN
15.0000 [IU] | Freq: Every day | SUBCUTANEOUS | Status: DC
  Administered 2019-03-04: 15 [IU] via SUBCUTANEOUS
  Filled 2019-03-04 (×2): qty 0.15

## 2019-03-04 MED ORDER — ONDANSETRON HCL 4 MG/2ML IJ SOLN
INTRAMUSCULAR | Status: AC
  Filled 2019-03-04: qty 2

## 2019-03-04 MED ORDER — FENTANYL CITRATE (PF) 250 MCG/5ML IJ SOLN
INTRAMUSCULAR | Status: DC | PRN
  Administered 2019-03-04: 25 ug via INTRAVENOUS
  Administered 2019-03-04: 50 ug via INTRAVENOUS
  Administered 2019-03-04: 25 ug via INTRAVENOUS
  Administered 2019-03-04: 50 ug via INTRAVENOUS
  Administered 2019-03-04: 100 ug via INTRAVENOUS

## 2019-03-04 MED ORDER — CEFAZOLIN SODIUM-DEXTROSE 2-4 GM/100ML-% IV SOLN
INTRAVENOUS | Status: AC
  Filled 2019-03-04: qty 100

## 2019-03-04 MED ORDER — HYDROMORPHONE 1 MG/ML IV SOLN
INTRAVENOUS | Status: DC
  Administered 2019-03-04: 30 mg via INTRAVENOUS
  Administered 2019-03-05: 0.3 mg via INTRAVENOUS
  Administered 2019-03-05: 0 mg via INTRAVENOUS
  Administered 2019-03-05: 1 mg via INTRAVENOUS
  Administered 2019-03-05 – 2019-03-06 (×3): 0.2 mg via INTRAVENOUS
  Administered 2019-03-06: 0.4 mg via INTRAVENOUS
  Administered 2019-03-06: 0 mg via INTRAVENOUS
  Administered 2019-03-06: 0.6 mg via INTRAVENOUS
  Administered 2019-03-06 – 2019-03-07 (×5): 0 mg via INTRAVENOUS
  Filled 2019-03-04: qty 30

## 2019-03-04 MED ORDER — ESMOLOL HCL 100 MG/10ML IV SOLN
INTRAVENOUS | Status: AC
  Filled 2019-03-04: qty 10

## 2019-03-04 MED ORDER — ACETAMINOPHEN 500 MG PO TABS
1000.0000 mg | ORAL_TABLET | ORAL | Status: AC
  Administered 2019-03-04: 06:00:00 1000 mg via ORAL

## 2019-03-04 MED ORDER — ALBUMIN HUMAN 5 % IV SOLN
INTRAVENOUS | Status: DC | PRN
  Administered 2019-03-04 (×3): via INTRAVENOUS

## 2019-03-04 MED ORDER — LACTATED RINGERS IV SOLN
INTRAVENOUS | Status: DC | PRN
  Administered 2019-03-04 (×2): via INTRAVENOUS

## 2019-03-04 MED ORDER — INSULIN DEGLUDEC 100 UNIT/ML ~~LOC~~ SOPN: 15.0000 [IU] | PEN_INJECTOR | Freq: Every day | SUBCUTANEOUS | Status: DC

## 2019-03-04 MED ORDER — CHLORHEXIDINE GLUCONATE CLOTH 2 % EX PADS: 6.0000 | MEDICATED_PAD | Freq: Once | CUTANEOUS | Status: DC

## 2019-03-04 MED ORDER — SODIUM CHLORIDE 0.9 % IV SOLN
INTRAVENOUS | Status: DC | PRN
  Administered 2019-03-04: 180 [IU]/h via INTRAVENOUS

## 2019-03-04 MED ORDER — CEFAZOLIN SODIUM-DEXTROSE 2-4 GM/100ML-% IV SOLN
INTRAVENOUS | Status: AC
  Administered 2019-03-04: 2 g via INTRAVENOUS
  Filled 2019-03-04: qty 100

## 2019-03-04 MED ORDER — ROPIVACAINE HCL 2 MG/ML IJ SOLN
INTRAMUSCULAR | Status: DC | PRN
  Administered 2019-03-04 (×2): 5 mL via EPIDURAL

## 2019-03-04 MED ORDER — LIDOCAINE 2% (20 MG/ML) 5 ML SYRINGE
INTRAMUSCULAR | Status: DC | PRN
  Administered 2019-03-04: 60 mg via INTRAVENOUS

## 2019-03-04 MED ORDER — MANNITOL 25 % IV SOLN
INTRAVENOUS | Status: DC | PRN
  Administered 2019-03-04 (×2): 12.5 g via INTRAVENOUS

## 2019-03-04 MED ORDER — FENTANYL CITRATE (PF) 100 MCG/2ML IJ SOLN: 25.0000 ug | INTRAMUSCULAR | Status: DC | PRN

## 2019-03-04 MED ORDER — LACTATED RINGERS IV SOLN
INTRAVENOUS | Status: DC | PRN
  Administered 2019-03-04 (×2): via INTRAVENOUS

## 2019-03-04 MED ORDER — NALOXONE HCL 0.4 MG/ML IJ SOLN: 0.4000 mg | INTRAMUSCULAR | Status: DC | PRN

## 2019-03-04 MED ORDER — ROPIVACAINE HCL 2 MG/ML IJ SOLN
8.0000 mL/h | INTRAMUSCULAR | Status: DC
  Administered 2019-03-04: 15:00:00 8 mL/h via EPIDURAL
  Filled 2019-03-04: qty 200

## 2019-03-04 MED ORDER — PHENYLEPHRINE HCL-NACL 10-0.9 MG/250ML-% IV SOLN
0.0000 ug/min | INTRAVENOUS | Status: DC
  Administered 2019-03-04: 23:00:00 35 ug/min via INTRAVENOUS
  Administered 2019-03-05: 60 ug/min via INTRAVENOUS
  Administered 2019-03-05 (×2): 55 ug/min via INTRAVENOUS
  Administered 2019-03-05: 50 ug/min via INTRAVENOUS
  Filled 2019-03-04: qty 250
  Filled 2019-03-04: qty 500
  Filled 2019-03-04 (×3): qty 250

## 2019-03-04 MED ORDER — INSULIN REGULAR HUMAN 100 UNIT/ML IJ SOLN
4.0000 [IU] | Freq: Once | INTRAMUSCULAR | Status: AC
  Administered 2019-03-04: 4 [IU] via SUBCUTANEOUS
  Filled 2019-03-04: qty 10

## 2019-03-04 MED ORDER — LIDOCAINE-EPINEPHRINE (PF) 1.5 %-1:200000 IJ SOLN
INTRAMUSCULAR | Status: DC | PRN
  Administered 2019-03-04: 3 mL via EPIDURAL

## 2019-03-04 MED ORDER — LIDOCAINE HCL (PF) 1 % IJ SOLN
INTRAMUSCULAR | Status: AC
  Filled 2019-03-04: qty 30

## 2019-03-04 MED ORDER — CEFAZOLIN SODIUM-DEXTROSE 2-4 GM/100ML-% IV SOLN
2.0000 g | Freq: Three times a day (TID) | INTRAVENOUS | Status: AC
  Administered 2019-03-04: 16:00:00 2 g via INTRAVENOUS

## 2019-03-04 MED ORDER — LIDOCAINE 2% (20 MG/ML) 5 ML SYRINGE
INTRAMUSCULAR | Status: AC
  Filled 2019-03-04: qty 5

## 2019-03-04 MED ORDER — PROPOFOL 10 MG/ML IV BOLUS
INTRAVENOUS | Status: AC
  Filled 2019-03-04: qty 20

## 2019-03-04 MED ORDER — 0.9 % SODIUM CHLORIDE (POUR BTL) OPTIME
TOPICAL | Status: DC | PRN
  Administered 2019-03-04 (×5): 1000 mL

## 2019-03-04 MED ORDER — ROCURONIUM BROMIDE 10 MG/ML (PF) SYRINGE
PREFILLED_SYRINGE | INTRAVENOUS | Status: DC | PRN
  Administered 2019-03-04: 20 mg via INTRAVENOUS
  Administered 2019-03-04: 70 mg via INTRAVENOUS
  Administered 2019-03-04: 30 mg via INTRAVENOUS
  Administered 2019-03-04 (×2): 50 mg via INTRAVENOUS
  Administered 2019-03-04: 30 mg via INTRAVENOUS

## 2019-03-04 MED ORDER — WATER FOR IRRIGATION, STERILE IR SOLN
Status: DC | PRN
  Administered 2019-03-04 (×2): 1000 mL

## 2019-03-04 MED ORDER — GABAPENTIN 300 MG PO CAPS
ORAL_CAPSULE | ORAL | Status: AC
  Administered 2019-03-04: 300 mg via ORAL
  Filled 2019-03-04: qty 1

## 2019-03-04 MED ORDER — VISTASEAL 10 ML SINGLE DOSE KIT
10.0000 mL | PACK | Freq: Once | CUTANEOUS | Status: DC
  Filled 2019-03-04: qty 10

## 2019-03-04 MED ORDER — VISTASEAL 10 ML SINGLE DOSE KIT
PACK | CUTANEOUS | Status: DC | PRN
  Administered 2019-03-04: 10 mL via TOPICAL

## 2019-03-04 MED ORDER — CEFAZOLIN SODIUM-DEXTROSE 2-4 GM/100ML-% IV SOLN
2.0000 g | INTRAVENOUS | Status: AC
  Administered 2019-03-04 (×2): 2 g via INTRAVENOUS

## 2019-03-04 MED ORDER — KCL IN DEXTROSE-NACL 30-5-0.45 MEQ/L-%-% IV SOLN
INTRAVENOUS | Status: DC
  Administered 2019-03-04 – 2019-03-14 (×17): via INTRAVENOUS
  Filled 2019-03-04 (×18): qty 1000

## 2019-03-04 MED ORDER — HYDROMORPHONE HCL 1 MG/ML IJ SOLN
INTRAMUSCULAR | Status: DC | PRN
  Administered 2019-03-04: 0.5 mg via INTRAVENOUS

## 2019-03-04 MED ORDER — INFLUENZA VAC SPLIT QUAD 0.5 ML IM SUSY
0.5000 mL | PREFILLED_SYRINGE | INTRAMUSCULAR | Status: AC
  Administered 2019-03-05: 0.5 mL via INTRAMUSCULAR
  Filled 2019-03-04: qty 0.5

## 2019-03-04 MED ORDER — ORAL CARE MOUTH RINSE
15.0000 mL | Freq: Two times a day (BID) | OROMUCOSAL | Status: DC
  Administered 2019-03-04 – 2019-03-14 (×13): 15 mL via OROMUCOSAL

## 2019-03-04 MED ORDER — ALBUMIN HUMAN 5 % IV SOLN
INTRAVENOUS | Status: AC
  Administered 2019-03-04: 12.5 g via INTRAVENOUS
  Filled 2019-03-04: qty 250

## 2019-03-04 MED ORDER — MIDAZOLAM HCL 5 MG/5ML IJ SOLN
INTRAMUSCULAR | Status: DC | PRN
  Administered 2019-03-04 (×2): 1 mg via INTRAVENOUS

## 2019-03-04 MED ORDER — ONDANSETRON HCL 4 MG/2ML IJ SOLN
4.0000 mg | Freq: Four times a day (QID) | INTRAMUSCULAR | Status: DC | PRN
  Administered 2019-03-09 – 2019-03-10 (×4): 4 mg via INTRAVENOUS
  Filled 2019-03-04 (×4): qty 2

## 2019-03-04 MED ORDER — ROPIVACAINE HCL 2 MG/ML IJ SOLN: 8.0000 mL/h | INTRAMUSCULAR | Status: DC

## 2019-03-04 MED ORDER — LIDOCAINE HCL (PF) 2 % IJ SOLN
INTRAMUSCULAR | Status: DC | PRN
  Administered 2019-03-04: 40 mg via EPIDURAL

## 2019-03-04 MED ORDER — DEXAMETHASONE SODIUM PHOSPHATE 10 MG/ML IJ SOLN
INTRAMUSCULAR | Status: DC | PRN
  Administered 2019-03-04: 4 mg via INTRAVENOUS

## 2019-03-04 MED ORDER — SCOPOLAMINE 1 MG/3DAYS TD PT72
MEDICATED_PATCH | TRANSDERMAL | Status: AC
  Filled 2019-03-04: qty 1

## 2019-03-04 MED ORDER — DEXAMETHASONE SODIUM PHOSPHATE 10 MG/ML IJ SOLN
INTRAMUSCULAR | Status: AC
  Filled 2019-03-04: qty 1

## 2019-03-04 MED ORDER — LIDOCAINE HCL 1 % IJ SOLN
INTRAMUSCULAR | Status: DC | PRN
  Administered 2019-03-04: 8 mL

## 2019-03-04 MED ORDER — METOPROLOL TARTRATE 5 MG/5ML IV SOLN: 5.0000 mg | Freq: Four times a day (QID) | INTRAVENOUS | Status: DC | PRN

## 2019-03-04 MED ORDER — HYDROMORPHONE HCL 1 MG/ML IJ SOLN
INTRAMUSCULAR | Status: AC
  Filled 2019-03-04: qty 0.5

## 2019-03-04 MED ORDER — SODIUM CHLORIDE 0.9 % IV SOLN
INTRAVENOUS | Status: DC | PRN
  Administered 2019-03-04: 160 ug/min via INTRAVENOUS

## 2019-03-04 MED ORDER — ONDANSETRON HCL 4 MG/2ML IJ SOLN
INTRAMUSCULAR | Status: DC | PRN
  Administered 2019-03-04: 4 mg via INTRAVENOUS

## 2019-03-04 MED ORDER — SUGAMMADEX SODIUM 200 MG/2ML IV SOLN
INTRAVENOUS | Status: DC | PRN
  Administered 2019-03-04: 200 mg via INTRAVENOUS

## 2019-03-04 MED ORDER — ONDANSETRON 4 MG PO TBDP: 4.0000 mg | ORAL_TABLET | Freq: Four times a day (QID) | ORAL | Status: DC | PRN

## 2019-03-04 MED FILL — Insulin Regular (Human) Inj 100 Unit/ML: INTRAMUSCULAR | Qty: 10 | Status: CN

## 2019-03-04 SURGICAL SUPPLY — 127 items
ADH SKN CLS APL DERMABOND .7 (GAUZE/BANDAGES/DRESSINGS)
AGENT HMST MTR 8 SURGIFLO (HEMOSTASIS) ×2
APL PRP STRL LF DISP 70% ISPRP (MISCELLANEOUS) ×2
BAG BILE T-TUBES STRL (MISCELLANEOUS) ×5 IMPLANT
BAG DRN 9.5 2 ADJ BELT ADPR (MISCELLANEOUS) ×2
BIOPATCH RED 1 DISK 7.0 (GAUZE/BANDAGES/DRESSINGS) ×1 IMPLANT
BLADE SURG 10 STRL SS (BLADE) ×3 IMPLANT
BOOT SUTURE AID YELLOW STND (SUTURE) ×4 IMPLANT
BRR ADH 5X3 SEPRAFILM 6 SHT (MISCELLANEOUS)
CANISTER SUCT 3000ML PPV (MISCELLANEOUS) ×3 IMPLANT
CHLORAPREP W/TINT 26 (MISCELLANEOUS) ×3 IMPLANT
CLIP VESOCCLUDE LG 6/CT (CLIP) ×3 IMPLANT
CLIP VESOCCLUDE MED 24/CT (CLIP) ×3 IMPLANT
CLIP VESOLOCK LG 6/CT PURPLE (CLIP) ×8 IMPLANT
CLIP VESOLOCK MED 6/CT (CLIP) ×3 IMPLANT
CLIP VESOLOCK MED LG 6/CT (CLIP) ×4 IMPLANT
CONT SPEC 4OZ CLIKSEAL STRL BL (MISCELLANEOUS) ×1 IMPLANT
COUNTER NEEDLE 20 DBL MAG RED (NEEDLE) IMPLANT
COVER MAYO STAND STRL (DRAPES) ×3 IMPLANT
COVER SURGICAL LIGHT HANDLE (MISCELLANEOUS) ×4 IMPLANT
DECANTER SPIKE VIAL GLASS SM (MISCELLANEOUS) ×6 IMPLANT
DERMABOND ADVANCED (GAUZE/BANDAGES/DRESSINGS)
DERMABOND ADVANCED .7 DNX12 (GAUZE/BANDAGES/DRESSINGS) ×2 IMPLANT
DRAIN CHANNEL 19F RND (DRAIN) ×6 IMPLANT
DRAIN PENROSE 18X1/2 LTX STRL (DRAIN) ×3 IMPLANT
DRAPE LAPAROSCOPIC ABDOMINAL (DRAPES) ×3 IMPLANT
DRAPE SLUSH MACHINE 52X66 (DRAPES) ×1 IMPLANT
DRAPE WARM FLUID 44X44 (DRAPES) ×3 IMPLANT
DRSG COVADERM 4X14 (GAUZE/BANDAGES/DRESSINGS) ×1 IMPLANT
DRSG TEGADERM 2-3/8X2-3/4 SM (GAUZE/BANDAGES/DRESSINGS) ×1 IMPLANT
DRSG TEGADERM 4X4.75 (GAUZE/BANDAGES/DRESSINGS) ×2 IMPLANT
ELECT BLADE 4.0 EZ CLEAN MEGAD (MISCELLANEOUS)
ELECT BLADE 6.5 EXT (BLADE) ×3 IMPLANT
ELECT CAUTERY BLADE 6.4 (BLADE) ×3 IMPLANT
ELECT PAD DSPR THERM+ ADLT (MISCELLANEOUS) ×1 IMPLANT
ELECT REM PT RETURN 9FT ADLT (ELECTROSURGICAL) ×3
ELECTRODE BLDE 4.0 EZ CLN MEGD (MISCELLANEOUS) IMPLANT
ELECTRODE REM PT RTRN 9FT ADLT (ELECTROSURGICAL) ×2 IMPLANT
EVACUATOR SILICONE 100CC (DRAIN) ×1 IMPLANT
GAUZE SPONGE 2X2 8PLY STRL LF (GAUZE/BANDAGES/DRESSINGS) IMPLANT
GEL ULTRASOUND 20GR AQUASONIC (MISCELLANEOUS) ×3 IMPLANT
GLOVE BIO SURGEON STRL SZ 6 (GLOVE) ×9 IMPLANT
GLOVE BIO SURGEON STRL SZ7 (GLOVE) ×4 IMPLANT
GLOVE BIOGEL M 8.0 STRL (GLOVE) ×1 IMPLANT
GLOVE BIOGEL PI IND STRL 6.5 (GLOVE) IMPLANT
GLOVE BIOGEL PI IND STRL 7.5 (GLOVE) IMPLANT
GLOVE BIOGEL PI IND STRL 8 (GLOVE) IMPLANT
GLOVE BIOGEL PI INDICATOR 6.5 (GLOVE) ×2
GLOVE BIOGEL PI INDICATOR 7.5 (GLOVE) ×3
GLOVE BIOGEL PI INDICATOR 8 (GLOVE) ×1
GLOVE ECLIPSE 7.5 STRL STRAW (GLOVE) ×3 IMPLANT
GLOVE INDICATOR 6.5 STRL GRN (GLOVE) ×7 IMPLANT
GOWN STRL REUS W/ TWL LRG LVL3 (GOWN DISPOSABLE) ×4 IMPLANT
GOWN STRL REUS W/TWL 2XL LVL3 (GOWN DISPOSABLE) ×6 IMPLANT
GOWN STRL REUS W/TWL LRG LVL3 (GOWN DISPOSABLE) ×24
HAND PENCIL TRP OPTION (MISCELLANEOUS) ×1 IMPLANT
HANDLE SUCTION POOLE (INSTRUMENTS) ×2 IMPLANT
HEMOSTAT SURGICEL 2X14 (HEMOSTASIS) ×3 IMPLANT
KIT BASIN OR (CUSTOM PROCEDURE TRAY) ×3 IMPLANT
KIT MARKER MARGIN INK (KITS) ×4 IMPLANT
KIT TUBE JEJUNAL 16FR (CATHETERS) ×3 IMPLANT
KIT TURNOVER KIT B (KITS) ×3 IMPLANT
L-HOOK LAP DISP 36CM (ELECTROSURGICAL) ×3
LHOOK LAP DISP 36CM (ELECTROSURGICAL) ×2 IMPLANT
LOOP VESSEL MAXI BLUE (MISCELLANEOUS) ×3 IMPLANT
LOOP VESSEL MINI RED (MISCELLANEOUS) ×3 IMPLANT
NEEDLE 22X1 1/2 (OR ONLY) (NEEDLE) ×3 IMPLANT
NS IRRIG 1000ML POUR BTL (IV SOLUTION) ×6 IMPLANT
PAD ARMBOARD 7.5X6 YLW CONV (MISCELLANEOUS) ×6 IMPLANT
PENCIL BUTTON HOLSTER BLD 10FT (ELECTRODE) ×3 IMPLANT
PENCIL SMOKE EVACUATOR (MISCELLANEOUS) ×3 IMPLANT
PLUG CATH AND CAP STER (CATHETERS) IMPLANT
RELOAD PROXIMATE 75MM BLUE (ENDOMECHANICALS) ×9 IMPLANT
RELOAD PROXIMATE 75MM GREEN (ENDOMECHANICALS) IMPLANT
RELOAD STAPLE 75 3.8 BLU REG (ENDOMECHANICALS) IMPLANT
RELOAD STAPLE 75 4.5 GRN THCK (ENDOMECHANICALS) IMPLANT
SEPRAFILM PROCEDURAL PACK 3X5 (MISCELLANEOUS) IMPLANT
SET TUBE SMOKE EVAC HIGH FLOW (TUBING) ×3 IMPLANT
SHEARS FOC LG CVD HARMONIC 17C (MISCELLANEOUS) ×3 IMPLANT
SLEEVE ENDOPATH XCEL 5M (ENDOMECHANICALS) ×3 IMPLANT
SLEEVE SUCTION 125 (MISCELLANEOUS) ×3 IMPLANT
SLEEVE SUCTION CATH 165 (SLEEVE) ×3 IMPLANT
SPOGE SURGIFLO 8M (HEMOSTASIS) ×1
SPONGE GAUZE 2X2 STER 10/PKG (GAUZE/BANDAGES/DRESSINGS) ×1
SPONGE INTESTINAL PEANUT (DISPOSABLE) IMPLANT
SPONGE LAP 18X18 RF (DISPOSABLE) ×11 IMPLANT
SPONGE SURGIFLO 8M (HEMOSTASIS) IMPLANT
SPONGE SURGIFOAM ABS GEL 100 (HEMOSTASIS) IMPLANT
STAPLER PROXIMATE 75MM BLUE (STAPLE) ×3 IMPLANT
STAPLER VISISTAT 35W (STAPLE) ×3 IMPLANT
SUCTION POOLE HANDLE (INSTRUMENTS) ×3
SUT 5.0 PDS RB-1 (SUTURE) ×5
SUT ETHILON 2 0 FS 18 (SUTURE) ×6 IMPLANT
SUT ETHILON 2 LR (SUTURE) IMPLANT
SUT MNCRL AB 4-0 PS2 18 (SUTURE) ×2 IMPLANT
SUT PDS AB 1 TP1 96 (SUTURE) ×8 IMPLANT
SUT PDS AB 3-0 SH 27 (SUTURE) ×12 IMPLANT
SUT PDS AB 4-0 RB1 27 (SUTURE) ×33 IMPLANT
SUT PDS PLUS AB 5-0 RB-1 (SUTURE) ×10 IMPLANT
SUT PROLENE 3 0 SH 48 (SUTURE) ×12 IMPLANT
SUT PROLENE 4 0 RB 1 (SUTURE) ×6
SUT PROLENE 4-0 RB1 .5 CRCL 36 (SUTURE) ×4 IMPLANT
SUT PROLENE 5 0 RB 1 DA (SUTURE) ×6 IMPLANT
SUT SILK 2 0 SH CR/8 (SUTURE) ×4 IMPLANT
SUT SILK 2 0 TIES 10X30 (SUTURE) ×3 IMPLANT
SUT SILK 3 0 SH CR/8 (SUTURE) ×3 IMPLANT
SUT SILK 3 0 TIES 10X30 (SUTURE) ×3 IMPLANT
SUT VIC AB 2-0 CT1 27 (SUTURE)
SUT VIC AB 2-0 CT1 TAPERPNT 27 (SUTURE) IMPLANT
SUT VIC AB 2-0 SH 18 (SUTURE) ×3 IMPLANT
SUT VIC AB 2-0 SH 27 (SUTURE) ×6
SUT VIC AB 2-0 SH 27X BRD (SUTURE) IMPLANT
SUT VIC AB 3-0 SH 18 (SUTURE) ×3 IMPLANT
SUT VIC AB 3-0 SH 27 (SUTURE) ×27
SUT VIC AB 3-0 SH 27X BRD (SUTURE) ×2 IMPLANT
SUT VICRYL AB 2 0 TIES (SUTURE) IMPLANT
SYRINGE TOOMEY DISP (SYRINGE) ×1 IMPLANT
TAPE UMBILICAL 1/8 X36 TWILL (MISCELLANEOUS) IMPLANT
TOWEL GREEN STERILE (TOWEL DISPOSABLE) ×4 IMPLANT
TRAY FOLEY MTR SLVR 14FR STAT (SET/KITS/TRAYS/PACK) ×3 IMPLANT
TRAY LAPAROSCOPIC MC (CUSTOM PROCEDURE TRAY) ×3 IMPLANT
TROCAR XCEL NON-BLD 5MMX100MML (ENDOMECHANICALS) ×3 IMPLANT
TUBE CONNECTING 12X1/4 (SUCTIONS) ×3 IMPLANT
TUBE FEEDING 8FR 16IN STR KANG (MISCELLANEOUS) IMPLANT
TUBE FEEDING ENTERAL 5FR 16IN (TUBING) ×1 IMPLANT
TUNNELER SHEATH ON-Q 16GX12 DP (PAIN MANAGEMENT) ×3 IMPLANT
YANKAUER SUCT BULB TIP NO VENT (SUCTIONS) ×3 IMPLANT

## 2019-03-04 NOTE — Progress Notes (Addendum)
Notified anesthesia about ropivacaine continuous epidural order is discontinued. Dr. Linna Caprice of anesthesia said he would take a look at orders. Will continue to monitor.

## 2019-03-04 NOTE — Addendum Note (Signed)
Addendum  created 03/04/19 2123 by Roberts Gaudy, MD   Order list changed

## 2019-03-04 NOTE — Op Note (Signed)
PREOPERATIVE DIAGNOSIS: primary malignant neuroendocrine tumor of the duodenum, right superior pole renal mass  POSTOPERATIVE DIAGNOSIS: Same.   PROCEDURES PERFORMED:  Diagnostic laparoscopy  Classic pancreaticoduodenectomy   Placement of pancreatic duct stent   SURGEON: Stark Klein, MD   ASSISTANT: Verita Lamb, MD, Judyann Munson, RNFA   ANESTHESIA: General and epidural   FINDINGS: 2.5 cm pancreatic head mass. Firm pancreatic tissue. 10 mm common bile duct. 6 mm pancreatic duct  SPECIMENS:  1. Pancreaticoduodenectomy with gallbladder:  2. Portal lymph nodes   ESTIMATED BLOOD LOSS: 400 mL.   COMPLICATIONS: None known.   PROCEDURE:   Pt was identified in the holding area and taken to  the operating room, and placed supine on the operating room  table. General anesthesia was induced. The patient's abdomen was  prepped and draped in a sterile fashion, after a Foley catheter was  placed. A time-out was performed according to the surgical safety check  list. When all was correct we continued.   The patient was placed in reverse trendelenburg position and rotated to the right.  The left subcostal margin was anesthetized with local anesthesia.  A 5 mm optiview trocar was placed under direct visualization.  The abdomen was insufflated with carbon dioxide.  The abdomen was examined.  A second port was placed in the upper midline to be able to better visualize the right liver.  No evidence of carcinomatosis was seen.    A midline incision was made from the xiphoid to just above the umbilicus. The subcutaneous tissues were divided with the Bovie cautery. The peritoneum was entered in the center of the abdomen. Digital retraction was then used to elevate the preperitoneal fat, and this was taken with the cautery as well. Care was taken to protect the underlying viscera.   The Bookwalter self-retaining retractor was placed for visualization. The right colon was taken down off of the white  line of Toldt and from the retroperitoneum at the hepatic flexure. The porta was identified. The duodenum was kocherized extensively with blunt dissection and with cautery. The gallbladder was taken off the liver with a combination of blunt dissection and cautery. The cystic duct was clipped with the Hemalock clips. The cystic duct was divided and the gallbladder was passed off.   The common bile duct was skeletonized near the duodenum. A vessel loop was passed around it. The gastroduodenal artery, as well as the common hepatic artery were skeletonized. The proper hepatic artery was traced out to make sure that flow was going to both sides of the liver when the GDA was clamped. The GDA was test clamped with the bulldog, with good flow to the liver and no signs of ischemia. This was divided with 2-0 silk ties and then clipped. The proper hepatic artery was reflected upward, and the anterior portal vein was exposed. A Kelly clamp was passed underneath the pancreas at the superior mesenteric vein, and this passed easily with no signs of tumor involvement.   At this point, Dr. Alyson Ingles of urology came in to do a partial nephrectomy of the superior pole renal lesion.  Once he was finished, I resumed the whipple.    Attention was then directed to the stomach, and the omentum was taken  off of the stomach at the border of the antrum and the body. The  gastrohepatic ligament was taken down with the harmonic, and care was  taken to make sure there was not a replaced left hepatic artery in this  location. The  stomach was divided with the GIA-75 stapler. The border  of the stomach was oversewn with a 3-0 running PDS suture.   Attention was then directed to the small bowel. Around 10 cm past the  ligament of Treitz it was located and the small bowel was divided with the 75-GIA.  The distal portion of the jejunum was also oversewn with a 3-0 PDS  suture. The fourth portion of the duodenum was skeletonized with  the  harmonic scalpel, taking down all of the mesenteric vessels. The  ligament of Treitz was taken down. The IMV was preserved.  The duodenum was then passed underneath the portal vein.   At this point the Claiborne Billings was replaced and the pancreas was divided with the cautery. 2-0 silk sutures were tied down and the inferior and superior border of the pancreas. The Bovie was used to coagulate the small bleeders at the border of the pancreas.  The Overholt in combination with the harmonic and locking Weck clips  were then used to take the uncinate process off of the portal vein and  the superior mesenteric artery. Care was taken not to incorporate the  superior mesenteric artery in the dissection. The specimen was then marked with the margin marker paint kit and passed off the table for frozen section margin.   The jejunum was then passed underneath the SMV in order to get appropriate lie for the pancreatic and biliary anastomoses. The more distal portion of the jejunum was pulled up over the colon, and two 3-0 silks were placed through the posterior border  of the stomach for the gastrojejunostomy. The stomach and the small  bowel were opened, and a GIA-75 was used to create an end-to-end  anastomosis. The open areas of the staple line were examined to ensure  that there was hemostasis. The defect was then closed with a single  layer of running Connell suture of 3-0 PDS. Prior to a complete  closure, the NG tube was passed toward the afferent limb.   At this point the frozens returned back as all negative.   The appropriate location for the choledochojejunostomy was identified, and  the small bowel was opened approximately 12 mm. The anastamosis was created with approximately eleven 4-0 interrupted PDS sutures.   The 2 corner sutures were placed first and then the posterior layer was done in an interrupted fashion tying on  the inside. The superior layer was then closed with interrupted sutures as   well. 2-0 vicryl sutures were used to secure the jejunum to the retroperitoneal tissue to avoid tension on the anastamosis and to avoid torsion.    The pancreatic anastomosis was then created by opening the jejunum the length  of the pancreatic parenchyma. The pancreas was firm, and the duct was 6 mm. A pediatric feeding tube was used as a pancreatic stent. The posterior layer was formed first with 2-0 silk sutures in interrupted fashion. A duct-to-mucosa anastamosis was created with five 5-0 PDS sutures. The anterior layer was then oversewn with 2-0 silks to dunk the pancreatic parenchyma.   The areas were then irrigated and then those anastomoses were covered  with Vistaseal. This was allowed to dry. The abdomen was then irrigated  again and all the laparotomy sponges were removed. A lap count was  performed, which was correct. Two 19-Blake drains were placed, with the  lateral-most drain placed behind the choledochojejunostomy and in front of the partial nephrectomy. The medial Blake drain was placed just anterior and slightly  superior to the pancreaticojejunostomy.  The fascia was then closed with #1 looped running PDS sutures. The skin was irrigated and then closed with staples. The wounds were cleaned, dried and dressed with a sterile dressing.   The patient tolerated the procedure well and was extubated and taken to  PACU in stable condition. Needle and sponge counts were correct x2.

## 2019-03-04 NOTE — Op Note (Signed)
Preoperative diagnosis: Right renal mass  Postop diagnosis: Same  Procedure: 1.  Open Right radical partial nephrectomy  Attending: Nicolette Bang  Assistant: Clemetine Marker  Anesthesia: General  Estimated blood loss: 200 cc  Drains: 16 French Foley catheter, JP drain  Specimens: Right renal mass with overlying renal fat  Antibiotics: ancef  Findings: 1 artery and 1 vein. 4cm right posterior mid pole renal mass Cold Ischemia time: 30 minutes  Indications: Patient is a 53 year old with a history of 4 cm right renal mass.  The mass was amenable to partial nephrectomy.  After discussing treatment options patient decided to proceed with right open partial nephrectomy.  Procedure in detail: Prior to procedure consent was obtained. Patient was brought to the operating room and briefing was done sure correct patient, correct procedure, correct site.  General anesthesia was in administered patient was placed in the supine position.  a 2 French catheter was placed. their abdomen and flank was then prepped and draped usual sterile fashion. Dr. Barry Dienes made a midline laparotomy incision and mobilized the large bowel medially exposing the kidney. We then identified the ureter and gonadal vein and traced them to the renal hilum. We identified 1 renal and 1 renal vein. We then mobilized the right kindey laterally, posteriorly, and superiorly. We then defatted the kidney and identified the renal mass. We then proceeded to clamp the renal artery and vein. 12.5mg  of mannitol was then administered. We then placed the kidney on ice for 15 minutes. We then incised the capsule surrounding the mass was electrocautery. Then used blunt and sharp dissection to remove the mass. We then used multiple 3-0 vicryl sutures on individual vessels in the renal defect. We then released the clamped and noted 1 arterial bleeder which was ligated with an interrupted 3-0 vicryl suture. No collecting system violation was noted.  We then used the argon laser in the partial nephrectomy bed to obtain additional hemostasis. We then placed sugicell and surgiflo in the defect and then patched the renal defect with overlying renal fat. This was secured in place with 2-0 vicryl. This concluded the urologic portion of the procedure which was well tolerated by the patient.  Complications: None  Condition: Stable  Plan: Patient is to be admitted for inpatient stay. Bedrest for 24 hours.  JP is to remain in place until output is less that 150cc in 24 hours.

## 2019-03-04 NOTE — Anesthesia Procedure Notes (Signed)
Epidural Patient location during procedure: pre-op Start time: 03/04/2019 7:41 AM End time: 03/04/2019 7:51 AM  Staffing Anesthesiologist: Oleta Mouse, MD  Preanesthetic Checklist Completed: patient identified, site marked, surgical consent, pre-op evaluation, timeout performed, IV checked, risks and benefits discussed and monitors and equipment checked  Epidural Patient position: sitting Prep: DuraPrep Patient monitoring: heart rate, continuous pulse ox and blood pressure Approach: midline Location: thoracic (1-12) Injection technique: LOR saline  Needle:  Needle type: Tuohy  Needle gauge: 17 G Needle length: 9 cm Needle insertion depth: 7 cm Catheter type: closed end flexible Catheter size: 19 Gauge Catheter at skin depth: 12 cm  Additional Notes Reason for block:at surgeon's request and post-op pain management

## 2019-03-04 NOTE — Anesthesia Procedure Notes (Signed)
Arterial Line Insertion Start/End9/24/2020 6:55 AM, 03/04/2019 7:10 AM Performed by: Brennan Bailey, MD, Milford Cage, CRNA, anesthesiologist  Patient location: Pre-op. Preanesthetic checklist: patient identified, IV checked, site marked, risks and benefits discussed, surgical consent, monitors and equipment checked, pre-op evaluation, timeout performed and anesthesia consent Lidocaine 1% used for infiltration radial was placed Catheter size: 20 G Hand hygiene performed , maximum sterile barriers used  and Seldinger technique used  Attempts: 2 Procedure performed using ultrasound guided technique. Following insertion, dressing applied. Post procedure assessment: normal and unchanged  Patient tolerated the procedure well with no immediate complications.

## 2019-03-04 NOTE — Transfer of Care (Addendum)
Immediate Anesthesia Transfer of Care Note  Patient: Alyssa Ross  Procedure(s) Performed: LAPAROSCOPY DIAGNOSTIC (N/A Abdomen) WHIPPLE PROCEDURE (N/A Abdomen) Nephrectomy Partial (Right Abdomen)  Patient Location: PACU  Anesthesia Type:General  Level of Consciousness: drowsy  Airway & Oxygen Therapy: Patient Spontanous Breathing and Patient connected to face mask oxygen  Post-op Assessment: Report given to RN and Post -op Vital signs reviewed and unstable, Anesthesiologist notified  Post vital signs: Reviewed and stable on vasopressors  Last Vitals:  Vitals Value Taken Time  BP 95/59 03/04/19 1433  Temp    Pulse 85 03/04/19 1434  Resp 11 03/04/19 1434  SpO2 100 % 03/04/19 1434  Vitals shown include unvalidated device data.  Last Pain:  Vitals:   03/04/19 0610  TempSrc:   PainSc: 0-No pain      Patients Stated Pain Goal: 2 (97/47/18 [id number])  Complications: No apparent anesthesia complications

## 2019-03-04 NOTE — Anesthesia Procedure Notes (Signed)
Procedure Name: Intubation Performed by: Milford Cage, CRNA Pre-anesthesia Checklist: Patient identified, Emergency Drugs available, Suction available and Patient being monitored Patient Re-evaluated:Patient Re-evaluated prior to induction Oxygen Delivery Method: Circle System Utilized Preoxygenation: Pre-oxygenation with 100% oxygen Induction Type: IV induction Ventilation: Mask ventilation without difficulty Laryngoscope Size: Mac and 3 Grade View: Grade I Tube type: Oral Tube size: 7.5 mm Number of attempts: 1 Airway Equipment and Method: Stylet and Oral airway Placement Confirmation: ETT inserted through vocal cords under direct vision,  positive ETCO2 and breath sounds checked- equal and bilateral Secured at: 22 cm Tube secured with: Tape Dental Injury: Teeth and Oropharynx as per pre-operative assessment

## 2019-03-04 NOTE — Consult Note (Signed)
Reason for Consult: Right Renal Mass  Referring Physician: Stark Klein MD  Alyssa Ross is an 53 y.o. female.   HPI:   1 -RIGHT Renal Mass - 3.5 cm cystic / solid Rt anteriro mass incidetnal on staging for pancreatic cancer. 1 artery (early branching) . 1 vein right renovascular anatomy.  PMH sig for IDDM2, her PCP is Glendale Chard MD.   Today "Alyssa Ross" is seen for urgent evaluation of right renal mass. She is undergoing possible whipple today.   Past Medical History:  Diagnosis Date  . ASCUS (atypical squamous cells of undetermined significance) on Pap smear 08/06/1999  . Breast mass in female 2002   Left  . Cancer [facility])    Neuroendocrine Tumor of the pancreas  . Diabetes mellitus without complication ([place])   . Family history of pancreatic cancer   . Fibroid uterus 2010  . Hypertension   . Irregular bleeding 2011  . 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 PONV (postoperative nausea and vomiting)    nausea  vomitting after 12/25/18 ERCP  . 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 Yeast vaginitis 2006    Past Surgical History:  Procedure Laterality Date  . BILIARY STENT PLACEMENT N/A 12/25/2018   Procedure: BILIARY STENT PLACEMENT;  Surgeon: Carol Ada, MD;  Location: [facility];  Service: Endoscopy;  Laterality: N/A;  . BILIARY STENT PLACEMENT N/A 01/01/2019   Procedure: BILIARY STENT PLACEMENT;  Surgeon: Carol Ada, MD;  Location: [facility];  Service: Endoscopy;  Laterality: N/A;  . DILATATION & CURETTAGE/HYSTEROSCOPY WITH TRUECLEAR N/A 01/20/2014   Procedure: DILATATION & CURETTAGE/HYSTEROSCOPY WITH TRUCLEAR;  Surgeon: [name], MD;  Location: [facility];  Service: Gynecology;  Laterality: N/A;  . DILATATION & CURRETTAGE/HYSTEROSCOPY WITH RESECTOCOPE N/A 01/20/2014   Procedure: [place];  Surgeon: [name], MD;  Location: [facility];  Service: Gynecology;  Laterality: N/A;  .  ENDOSCOPIC RETROGRADE CHOLANGIOPANCREATOGRAPHY (ERCP) WITH PROPOFOL N/A 12/25/2018   Procedure: ENDOSCOPIC RETROGRADE CHOLANGIOPANCREATOGRAPHY (ERCP) WITH PROPOFOL;  Surgeon: Carol Ada, MD;  Location: [facility];  Service: Endoscopy;  Laterality: N/A;  . ENDOSCOPIC RETROGRADE CHOLANGIOPANCREATOGRAPHY (ERCP) WITH PROPOFOL N/A 01/01/2019   Procedure: ENDOSCOPIC RETROGRADE CHOLANGIOPANCREATOGRAPHY (ERCP) WITH PROPOFOL;  Surgeon: Carol Ada, MD;  Location: [facility];  Service: Endoscopy;  Laterality: N/A;  . ERCP  12/25/2018  . FINE NEEDLE ASPIRATION N/A 01/01/2019   Procedure: FINE NEEDLE ASPIRATION (FNA) LINEAR;  Surgeon: Carol Ada, MD;  Location: [facility];  Service: Endoscopy;  Laterality: N/A;  . NO PAST SURGERIES    . SPHINCTEROTOMY  12/25/2018   Procedure: SPHINCTEROTOMY;  Surgeon: Carol Ada, MD;  Location: [facility];  Service: Endoscopy;;  . Lavell Islam REMOVAL  01/01/2019   Procedure: STENT REMOVAL;  Surgeon: Carol Ada, MD;  Location: [facility];  Service: Endoscopy;;  . UPPER ESOPHAGEAL ENDOSCOPIC ULTRASOUND (EUS) N/A 01/01/2019   Procedure: UPPER ESOPHAGEAL ENDOSCOPIC ULTRASOUND (EUS);  Surgeon: Carol Ada, MD;  Location: [facility];  Service: Endoscopy;  Laterality: N/A;    Family History  Problem Relation Age of Onset  . Diabetes Mother   . Dementia Mother   . Hyperlipidemia Mother   . Hypertension Mother   . Irregular heart beat Mother   . Diabetes Father   . Hypertension Father   . Hyperlipidemia Father   . Down syndrome Sister   . Diabetes Sister   . Hyperlipidemia Brother   . Heart Problems Brother   . Goiter Maternal Aunt   . Thyroid nodules Sister   .  Cancer Cousin 60       eye; maternal first cousin  . Goiter Cousin   . Cancer Paternal Aunt        unknown form of cancer  . Cancer Cousin        unknown form of cancer; paternal first cousin  . Pancreatic cancer Cousin 2       paternal first cousin  . Cancer Cousin 25       unknown  cancer; paternal first cousin  . Cancer Cousin 46       unknown cancer; paternal first cousin    Social History:  reports that she has never smoked. She has never used smokeless tobacco. She reports current alcohol use. She reports that she does not use drugs.  Allergies:  Allergies  Allergen Reactions  . Cherry Rash  . Lemon Oil Rash    Medications: I have reviewed the patient's current medications.  Results for orders placed or performed during the hospital encounter of 03/02/19 (from the past 48 hour(s))  Glucose, capillary     Status: Abnormal   Collection Time: 03/02/19  8:21 AM  Result Value Ref Range   Glucose-Capillary 193 (H) 70 - 99 mg/dL  CBC WITH DIFFERENTIAL     Status: None   Collection Time: 03/02/19  8:41 AM  Result Value Ref Range   WBC 5.8 4.0 - 10.5 K/uL   RBC 4.46 3.87 - 5.11 MIL/uL   Hemoglobin 13.2 12.0 - 15.0 g/dL   HCT 40.5 36.0 - 46.0 %   MCV 90.8 80.0 - 100.0 fL   MCH 29.6 26.0 - 34.0 pg   MCHC 32.6 30.0 - 36.0 g/dL   RDW 12.5 11.5 - 15.5 %   Platelets 399 150 - 400 K/uL   nRBC 0.0 0.0 - 0.2 %   Neutrophils Relative % 44 %   Neutro Abs 2.6 1.7 - 7.7 K/uL   Lymphocytes Relative 47 %   Lymphs Abs 2.7 0.7 - 4.0 K/uL   Monocytes Relative 6 %   Monocytes Absolute 0.4 0.1 - 1.0 K/uL   Eosinophils Relative 2 %   Eosinophils Absolute 0.1 0.0 - 0.5 K/uL   Basophils Relative 1 %   Basophils Absolute 0.1 0.0 - 0.1 K/uL   Immature Granulocytes 0 %   Abs Immature Granulocytes 0.01 0.00 - 0.07 K/uL    Comment: Performed at [facility], [address], [place], [place] [address]  Comprehensive metabolic panel     Status: Abnormal   Collection Time: 03/02/19  8:41 AM  Result Value Ref Range   Sodium 139 135 - 145 mmol/L   Potassium 4.0 3.5 - 5.1 mmol/L   Chloride 104 98 - 111 mmol/L   CO2 24 22 - 32 mmol/L   Glucose, Bld 189 (H) 70 - 99 mg/dL   BUN 16 6 - 20 mg/dL   Creatinine, Ser 0.91 0.44 - 1.00 mg/dL   Calcium 10.0 8.9 - 10.3 mg/dL    Total Protein 7.6 6.5 - 8.1 g/dL   Albumin 3.7 3.5 - 5.0 g/dL   AST 14 (L) 15 - 41 U/L   ALT 18 0 - 44 U/L   Alkaline Phosphatase 74 38 - 126 U/L   Total Bilirubin 0.5 0.3 - 1.2 mg/dL   GFR calc non Af Amer >60 >60 mL/min   GFR calc Af Amer >60 >60 mL/min   Anion gap 11 5 - 15    Comment: Performed at [facility], [address] [place], [place] [address]  Hemoglobin A1c     Status: Abnormal   Collection Time: 03/02/19  8:41 AM  Result Value Ref Range   Hgb A1c MFr Bld 8.9 (H) 4.8 - 5.6 %    Comment: (NOTE)         Prediabetes: 5.7 - 6.4         Diabetes: >6.4         Glycemic control for adults with diabetes: <7.0    Mean Plasma Glucose 209 mg/dL    Comment: (NOTE) Performed At: [facility] [place], [place] [id number] Rush Farmer MD WN:[id number]   Protime-INR     Status: None   Collection Time: 03/02/19  8:41 AM  Result Value Ref Range   Prothrombin Time 13.7 11.4 - 15.2 seconds   INR 1.1 0.8 - 1.2    Comment: (NOTE) INR goal varies based on device and disease states. Performed at [facility], [place] [address] [place], [place] [address]   Prepare RBC (crossmatch)     Status: None   Collection Time: 03/02/19  9:00 AM  Result Value Ref Range   Order Confirmation      ORDER PROCESSED BY BLOOD BANK Performed at [facility], [place] [address], [place], [place] [address]   Type and screen     Status: None (Preliminary result)   Collection Time: 03/02/19  9:00 AM  Result Value Ref Range   ABO/RH(D) AB POS    Antibody Screen NEG    Sample Expiration 03/16/2019,2359    Extend sample reason NO TRANSFUSIONS OR PREGNANCY IN THE PAST 3 MONTHS    Unit Number [id number]    Blood Component Type RED CELLS,LR    Unit division 00    Status of Unit ALLOCATED    Transfusion Status OK TO TRANSFUSE    Crossmatch Result      Compatible Performed at [facility], [place] [address] [place], [place] [address]    Unit Number  [id number]    Blood Component Type RED CELLS,LR    Unit division 00    Status of Unit ALLOCATED    Transfusion Status OK TO TRANSFUSE    Crossmatch Result Compatible    Unit Number [id number]    Blood Component Type RED CELLS,LR    Unit division 00    Status of Unit ALLOCATED    Transfusion Status OK TO TRANSFUSE    Crossmatch Result Compatible    Unit Number [id number]    Blood Component Type RED CELLS,LR    Unit division 00    Status of Unit ALLOCATED    Transfusion Status OK TO TRANSFUSE    Crossmatch Result Compatible   ABO/Rh     Status: None   Collection Time: 03/02/19  9:00 AM  Result Value Ref Range   ABO/RH(D)      AB POS Performed at [facility], [place] [address], [place], [place] [address]     Nm Pet (netspot Ga 57 Dotatate) Skull Base To Mid Thigh  Result Date: 03/03/2019 CLINICAL DATA:  Primary malignant neuroendocrine neoplasm of the pancreas. EXAM: NUCLEAR MEDICINE PET SKULL BASE TO THIGH TECHNIQUE: 5.5 mCi Ga 37 DOTATATE was injected intravenously. Full-ring PET imaging was performed from the skull base to thigh after the radiotracer. CT data was obtained and used for attenuation correction and anatomic localization. COMPARISON:  CT 01/29/2019 FINDINGS: NECK No radiotracer activity in neck lymph nodes. Incidental CT findings:  None CHEST Within the RIGHT upper lobe rounded nodule measures 9 mm (image 87/3). Within the superior segment of the LEFT lower lobe small cavitary nodule measures 8 mm (image 91/3. These 2 nodules are present on comparison CT from 01/29/2019 with no appreciable change. Both these nodules DO NOT accumulate the somatostatin receptor specific radiotracer. Incidental CT finding:None ABDOMEN/PELVIS pancreatic stent through the pancreatic head. There is NO focal activity within the pancreatic head body or tail to localize pancreatic neuroendocrine tumor. Of note there is typical physiologic activity within the head and uncinate of the  pancreas. There is background activity with throughout the pancreas on today's scan without focality. No focal lesions within the liver. Pneumobilia related to sphincterotomy. No hypermetabolic lymph nodes in the upper abdomen. No hypermetabolic pelvic nodes. Large mass posterior LEFT of the uterus measuring 7.5 x 4.7 cm. This mass appears contiguous with the uterus and likely represent leiomyoma. There is moderate radiotracer activity accumulation within the mass. No IV contrast. Ovaries poorly demonstrated. Physiologic activity noted in the liver, spleen, adrenal glands and kidneys. Incidental CT findings:Exophytic from the RIGHT kidney is a partially calcified cystic lesion measuring 4.1 x 3.3 cm. No accumulation of radiotracer within this lesion. SKELETON No focal activity to suggest skeletal metastasis. Incidental CT findings:None IMPRESSION: 1. No focal activity within the pancreas to localize well differentiated neuroendocrine tumor. 2. No evidence of liver metastasis. 3. No radiotracer accumulation within suspicious pulmonary nodules. Differential would include non radiotracer avid neuroendocrine tumor (poorly differentiated) versus potentially other metastasis or less likely benign nodules. 4. Suspicious lesion of the RIGHT kidney categorized as Bosniak III cystic renal lesion on diagnostic CT. 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 5. 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 No IV contrast administered. Consider pelvic ultrasound for further evaluation. Electronically Signed   By: Suzy Bouchard M.D.   On: 03/03/2019 13:57    Review of Systems  Genitourinary: Negative for hematuria.  All other systems reviewed and are negative.  There were no vitals taken for this visit. Physical Exam  Constitutional: She appears well-developed.  Very pleasant in OR holding 45  HENT:  Head: Normocephalic.  Eyes: Pupils are equal, round, and reactive to light.  Neck: Normal  range of motion.  Cardiovascular: Normal rate.  Respiratory: Effort normal.  GI: Soft.  Genitourinary:    Genitourinary Comments: No CVAT at present   Musculoskeletal: Normal range of motion.  Neurological: She is alert.  Skin: Skin is warm.    Assessment/Plan:  1 -RIGHT Renal Mass - DDX complex cyst v. Cystic renal cancer. Size beyond surveillance protocols. Anterior location would make ablation problematic. Agree that if pancreatic tumor appears local only ./ resectable then concomitant Rt partial nephrectomy at time of whipple is optimal as operative exposure will be very simiilar and any attemtps at right renal surgery post-whipple would be extremely difficult.  Risks, benefits, alternatives, expecdted peri-op course discussed.  Given very short notice, we will try to coordinate surgery by myself or my on call colleague Dr. Alyson Ingles who has also reveiwd Alyssa Ross case and is in agreement with plan.   Alexis Frock 03/04/2019, 5:39 AM

## 2019-03-04 NOTE — Interval H&P Note (Signed)
History and Physical Interval Note:  03/04/2019 7:20 AM  Alyssa Ross  has presented today for surgery, with the diagnosis of MALIGNANT NEUROENDOCRINE TUMOR OF PANCREAS.  The various methods of treatment have been discussed with the patient and family. After consideration of risks, benefits and other options for treatment, the patient has consented to  Procedure(s) with comments: LAPAROSCOPY DIAGNOSTIC (N/A) - GENERAL AND EPIDURAL WHIPPLE PROCEDURE (N/A) - GENERAL AND EPIDURAL as a surgical intervention.  The patient's history has been reviewed, patient examined, no change in status, stable for surgery.  I have reviewed the patient's chart and labs.  Questions were answered to the patient's satisfaction.     Stark Klein

## 2019-03-04 NOTE — Anesthesia Postprocedure Evaluation (Signed)
Anesthesia Post Note  Patient: Alyssa Ross  Procedure(s) Performed: LAPAROSCOPY DIAGNOSTIC (N/A Abdomen) WHIPPLE PROCEDURE (N/A Abdomen) Nephrectomy Partial (Right Abdomen)     Patient location during evaluation: PACU Anesthesia Type: General Level of consciousness: sedated Pain management: pain level controlled Vital Signs Assessment: post-procedure vital signs reviewed and stable Respiratory status: spontaneous breathing and respiratory function stable Cardiovascular status: stable Postop Assessment: no apparent nausea or vomiting Anesthetic complications: no    Last Vitals:  Vitals:   03/04/19 1648 03/04/19 1700  BP: 97/65 95/62  Pulse: 76 74  Resp: 13 13  Temp:    SpO2: 100% 100%    Last Pain:  Vitals:   03/04/19 1630  TempSrc:   PainSc: 0-No pain                 Riann Oman DANIEL

## 2019-03-04 NOTE — Progress Notes (Addendum)
General surgery MD White paged and notified about patient's heart rate sustaining in the high 40s. Anesthesia MD Joslin paged as well. Both made aware that patient's blood pressure is on the softer side, but acceptable per orders of the phenylephrine infusion. Anesthesia MD gave verbal order to lower the rate of the epidural infusion to 8 mL/hour. Rate has been adjusted. Will continue to monitor.

## 2019-03-04 NOTE — Progress Notes (Signed)
Chaplain responded to spiritual care consult for prayer. Loreena was receptive of prayer and conversation. Pt requested a complete non Alyssa Ross version Bible. Chaplain provided prayer and bible. Chaplain remains available per request.   Chaplain Resident, Evelene Croon, M Div Pager # [phone] personal Pager # [phone] 0n-call

## 2019-03-05 ENCOUNTER — Inpatient Hospital Stay (HOSPITAL_COMMUNITY): Payer: BC Managed Care – PPO | Admitting: Anesthesiology

## 2019-03-05 ENCOUNTER — Encounter (HOSPITAL_COMMUNITY): Payer: Self-pay | Admitting: General Surgery

## 2019-03-05 LAB — GLUCOSE, CAPILLARY
Glucose-Capillary: 153 mg/dL — ABNORMAL HIGH (ref 70–99)
Glucose-Capillary: 164 mg/dL — ABNORMAL HIGH (ref 70–99)
Glucose-Capillary: 138 mg/dL — ABNORMAL HIGH (ref 70–99)
Glucose-Capillary: 154 mg/dL — ABNORMAL HIGH (ref 70–99)
Glucose-Capillary: 140 mg/dL — ABNORMAL HIGH (ref 70–99)

## 2019-03-05 LAB — PROTIME-INR
INR: 1.2 (ref 0.8–1.2)
Prothrombin Time: 14.8 seconds (ref 11.4–15.2)

## 2019-03-05 LAB — COMPREHENSIVE METABOLIC PANEL
ALT: 35 U/L (ref 0–44)
AST: 37 U/L (ref 15–41)
Albumin: 2.9 g/dL — ABNORMAL LOW (ref 3.5–5.0)
Alkaline Phosphatase: 39 U/L (ref 38–126)
Anion gap: 4 — ABNORMAL LOW (ref 5–15)
BUN: 15 mg/dL (ref 6–20)
CO2: 24 mmol/L (ref 22–32)
Calcium: 8.1 mg/dL — ABNORMAL LOW (ref 8.9–10.3)
Chloride: 108 mmol/L (ref 98–111)
Creatinine, Ser: 0.78 mg/dL (ref 0.44–1.00)
GFR calc Af Amer: >60 mL/min (ref >60)
GFR calc non Af Amer: >60 mL/min (ref >60)
Glucose, Bld: 194 mg/dL — ABNORMAL HIGH (ref 70–99)
Potassium: 4.1 mmol/L (ref 3.5–5.1)
Sodium: 136 mmol/L (ref 135–145)
Total Bilirubin: 0.8 mg/dL (ref 0.3–1.2)
Total Protein: 5.3 g/dL — ABNORMAL LOW (ref 6.5–8.1)

## 2019-03-05 LAB — MAGNESIUM: Magnesium: 1.5 mg/dL — ABNORMAL LOW (ref 1.7–2.4)

## 2019-03-05 LAB — CBC
HCT: 29.7 % — ABNORMAL LOW (ref 36.0–46.0)
Hemoglobin: 9.4 g/dL — ABNORMAL LOW (ref 12.0–15.0)
MCH: 29 pg (ref 26.0–34.0)
MCHC: 31.6 g/dL (ref 30.0–36.0)
MCV: 91.7 fL (ref 80.0–100.0)
Platelets: 338 10*3/uL (ref 150–400)
RBC: 3.24 MIL/uL — ABNORMAL LOW (ref 3.87–5.11)
RDW: 12.7 % (ref 11.5–15.5)
WBC: 8 10*3/uL (ref 4.0–10.5)
nRBC: 0 % (ref 0.0–0.2)

## 2019-03-05 LAB — PHOSPHORUS: Phosphorus: 3 mg/dL (ref 2.5–4.6)

## 2019-03-05 MED ORDER — SODIUM CHLORIDE 0.9 % IV SOLN
INTRAVENOUS | Status: DC | PRN
  Administered 2019-03-05: 23:00:00 250 mL via INTRAVENOUS

## 2019-03-05 MED ORDER — MAGNESIUM SULFATE 2 GM/50ML IV SOLN
2.0000 g | Freq: Once | INTRAVENOUS | Status: AC
  Administered 2019-03-05: 09:00:00 2 g via INTRAVENOUS
  Filled 2019-03-05: qty 50

## 2019-03-05 MED ORDER — INSULIN GLARGINE 100 UNIT/ML ~~LOC~~ SOLN
18.0000 [IU] | Freq: Every day | SUBCUTANEOUS | Status: DC
  Administered 2019-03-05 – 2019-03-13 (×9): 18 [IU] via SUBCUTANEOUS
  Filled 2019-03-05 (×11): qty 0.18

## 2019-03-05 MED ORDER — ALBUMIN HUMAN 25 % IV SOLN
25.0000 g | Freq: Four times a day (QID) | INTRAVENOUS | Status: DC
  Administered 2019-03-05 – 2019-03-06 (×4): 25 g via INTRAVENOUS
  Filled 2019-03-05: qty 100
  Filled 2019-03-05 (×2): qty 50
  Filled 2019-03-05: qty 150

## 2019-03-05 NOTE — Progress Notes (Addendum)
Anesthesia MD Joslin notified about patient's heart rate sustaining 42-45 beats per minute. Verbal order to lower rate of epidural infusion to 6 mL/hour. Anesthesia MD also stated that heart rate of 40 or greater is okay as long as systolic blood pressure remains 90 or greater. Will continue to monitor.

## 2019-03-05 NOTE — Progress Notes (Signed)
1 Day Post-Op   Subjective/Chief Complaint: Has not had to use PCA.  No complaints.  Had episode of bradycardia last night while asleep.  This resolved with decreasing rate of epidural.     Objective: Vital signs in last 24 hours: Temp:  [97.6 F (36.4 C)-98.4 F (36.9 C)] 98.4 F (36.9 C) (09/25 0400) Pulse Rate:  [44-88] 59 (09/25 0830) Resp:  [9-21] 16 (09/25 0830) BP: (80-126)/(52-86) 87/62 (09/25 0830) SpO2:  [88 %-100 %] 97 % (09/25 0830) Arterial Line BP: (65-120)/(41-87) 84/76 (09/25 0800) Weight:  [102.6 kg] 102.6 kg (09/24 1800) Last BM Date: 03/04/19  Intake/Output from previous day: 09/24 0701 - 09/25 0700 In: 6381.7 [I.V.:5087.9; IV Piggyback:1125.7] Out: 0814 [GYJEH:6314; Emesis/NG output:50; Drains:460; Blood:500] Intake/Output this shift: Total I/O In: 224.5 [I.V.:188.5; Other:6; NG/GT:30] Out: 125 [Urine:125]  General appearance: alert, cooperative and no distress Resp: breathing comfortably Cardio: regular rate and rhythm GI: soft, non distended, drains serosang. Extremities: extremities normal, atraumatic, no cyanosis or edema  Lab Results:  Recent Labs    03/04/19 1220 03/05/19 0421  WBC  --  8.0  HGB 11.2* 9.4*  HCT 33.0* 29.7*  PLT  --  338   BMET Recent Labs    03/04/19 1220 03/05/19 0421  NA 136 136  K 3.3* 4.1  CL  --  108  CO2  --  24  GLUCOSE 284* 194*  BUN  --  15  CREATININE  --  0.78  CALCIUM  --  8.1*   PT/INR Recent Labs    03/05/19 0421  LABPROT 14.8  INR 1.2   ABG Recent Labs    03/04/19 1045  PHART 7.344*  HCO3 23.9    Studies/Results: Nm Pet (netspot Ga 68 Dotatate) Skull Base To Mid Thigh  Result Date: 03/03/2019 CLINICAL DATA:  Primary malignant neuroendocrine neoplasm of the pancreas. EXAM: NUCLEAR MEDICINE PET SKULL BASE TO THIGH TECHNIQUE: 5.5 mCi Ga 15 DOTATATE was injected intravenously. Full-ring PET imaging was performed from the skull base to thigh after the radiotracer. CT data was obtained and  used for attenuation correction and anatomic localization. COMPARISON:  CT 01/29/2019 FINDINGS: NECK No radiotracer activity in neck lymph nodes. Incidental CT findings: None CHEST Within the RIGHT upper lobe rounded nodule measures 9 mm (image 87/3). Within the superior segment of the LEFT lower lobe small cavitary nodule measures 8 mm (image 91/3. These 2 nodules are present on comparison CT from 01/29/2019 with no appreciable change. Both these nodules DO NOT accumulate the somatostatin receptor specific radiotracer. Incidental CT finding:None ABDOMEN/PELVIS pancreatic stent through the pancreatic head. There is NO focal activity within the pancreatic head body or tail to localize pancreatic neuroendocrine tumor. Of note there is typical physiologic activity within the head and uncinate of the pancreas. There is background activity with throughout the pancreas on today's scan without focality. No focal lesions within the liver. Pneumobilia related to sphincterotomy. No hypermetabolic lymph nodes in the upper abdomen. No hypermetabolic pelvic nodes. Large mass posterior LEFT of the uterus measuring 7.5 x 4.7 cm. This mass appears contiguous with the uterus and likely represent leiomyoma. There is moderate radiotracer activity accumulation within the mass. No IV contrast. Ovaries poorly demonstrated. Physiologic activity noted in the liver, spleen, adrenal glands and kidneys. Incidental CT findings:Exophytic from the RIGHT kidney is a partially calcified cystic lesion measuring 4.1 x 3.3 cm. No accumulation of radiotracer within this lesion. SKELETON No focal activity to suggest skeletal metastasis. Incidental CT findings:None IMPRESSION: 1. No focal  activity within the pancreas to localize well differentiated neuroendocrine tumor. 2. No evidence of liver metastasis. 3. No radiotracer accumulation within suspicious pulmonary nodules. Differential would include non radiotracer avid neuroendocrine tumor (poorly  differentiated) versus potentially other metastasis or less likely benign nodules. 4. Suspicious lesion of the RIGHT kidney categorized as Bosniak III cystic renal lesion on diagnostic CT. 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 5. 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 No IV contrast administered. Consider pelvic ultrasound for further evaluation. Electronically Signed   By: Suzy Bouchard M.D.   On: 03/03/2019 13:57    Anti-infectives: Anti-infectives (From admission, onward)   Start     Dose/Rate Route Frequency Ordered Stop   03/04/19 1500  ceFAZolin (ANCEF) IVPB 2g/100 mL premix     2 g 200 mL/hr over 30 Minutes Intravenous Every 8 hours 03/04/19 1445 03/04/19 1614   03/04/19 0603  ceFAZolin (ANCEF) 2-4 GM/100ML-% IVPB    Note to Pharmacy: Barbie Haggis   : cabinet override      03/04/19 0603 03/04/19 0820   03/04/19 0600  ceFAZolin (ANCEF) IVPB 2g/100 mL premix     2 g 200 mL/hr over 30 Minutes Intravenous On call to O.R. 03/04/19 0555 03/04/19 1220      Assessment/Plan: s/p Procedure(s) with comments: LAPAROSCOPY DIAGNOSTIC (N/A) - GENERAL AND EPIDURAL WHIPPLE PROCEDURE (N/A) - GENERAL AND EPIDURAL Nephrectomy Partial (Right) continue foley due to epidural and urinary output monitoring.    Epidural/pca for pain control Standing tylenol  Add albumin boluses Replete low mag Prn antihypertensive Await pathology - probable renal cell ca and neuroendocrine tumor of pancreas\ RISS and lantus for DM.   LOS: 1 day    Stark Klein 03/05/2019

## 2019-03-05 NOTE — Anesthesia Post-op Follow-up Note (Signed)
  Anesthesia Pain Follow-up Note  Patient: Alyssa Ross  Day #: 1  Date of Follow-up: 03/05/2019 Time: 1:02 PM  Last Vitals:  Vitals:   03/05/19 1152 03/05/19 1200  BP:  101/72  Pulse:  66  Resp: 17 19  Temp:  37.1 C  SpO2: 99% 97%    Level of Consciousness: alert  Pain: mild   Side Effects:None  Catheter Site Exam:clean, dry, no drainage  Epidural / Intrathecal (From admission, onward)   Start     Dose/Rate Route Frequency Ordered Stop   03/04/19 2145  ropivacaine (PF) 2 mg/mL (0.2%) (NAROPIN) injection     6 mL/hr 6 mL/hr  Epidural Continuous 03/04/19 2131 03/09/19 2144       Plan: Continue current therapy of postop epidural at surgeon's request   Rate reduced to 6cc/hr overnight for hypotension.  Catalina Gravel

## 2019-03-05 NOTE — Progress Notes (Signed)
1 Day Post-Op  Subjective: Patient reports that pain is well controlled. Afebrile, vitals stable, with MAP stable on phenylephrine. Urine is clear yellow, UOP adequate.   Objective: Vital signs in last 24 hours: Temp:  [97.6 F (36.4 C)-98.7 F (37.1 C)] 98.7 F (37.1 C) (09/25 1200) Pulse Rate:  [44-88] 74 (09/25 1300) Resp:  [9-21] 17 (09/25 1330) BP: (80-126)/(52-86) 103/70 (09/25 1330) SpO2:  [88 %-100 %] 99 % (09/25 1300) Arterial Line BP: (65-120)/(41-87) 84/76 (09/25 0800) Weight:  [102.6 kg] 102.6 kg (09/24 1800)  Intake/Output from previous day: 09/24 0701 - 09/25 0700 In: 6381.7 [I.V.:5087.9; IV Piggyback:1125.7] Out: 2498 [Urine:1488; Emesis/NG output:50; Drains:460; Blood:500] Intake/Output this shift: Total I/O In: 1311.4 [I.V.:992.5; Other:36; NG/GT:30; IV Piggyback:252.9] Out: 25 [Urine:750; Drains:40]  Physical Exam:  General: Alert and oriented CV: Regular rate Lungs: Clear Abdomen: Soft, appropriately tender Incisions: Clean dressings in place GU: light clear urine  Lab Results: Recent Labs    03/04/19 1045 03/04/19 1220 03/05/19 0421  HGB 11.2* 11.2* 9.4*  HCT 33.0* 33.0* 29.7*   BMET Recent Labs    03/04/19 1220 03/05/19 0421  NA 136 136  K 3.3* 4.1  CL  --  108  CO2  --  24  GLUCOSE 284* 194*  BUN  --  15  CREATININE  --  0.78  CALCIUM  --  8.1*     Studies/Results: No results found.  Assessment/Plan: 53 yo F s/p open partial nephrectomy for right renal mass on 03/05/2019 with urology (Dr Alyson Ingles), joint case with Surgical Oncology who performed Whipple for pancreatic neuroendocrine tumor (Dr Barry Dienes).   Patient is doing well from a urologic standpoint. We will defer Foley catheter removal to primary team. Defer discharge planning to Surgical Oncology team as we anticipate no further urology specific needs during hospitalization.  We will follow up with patient with next steps once renal mass pathology results available; this was  discussed with patient today. Please advise urology if we can be of assistance in meantime.    LOS: 1 day   Haskel Schroeder 03/05/2019, 1:50 PM

## 2019-03-06 LAB — GLUCOSE, CAPILLARY
Glucose-Capillary: 212 mg/dL — ABNORMAL HIGH (ref 70–99)
Glucose-Capillary: 168 mg/dL — ABNORMAL HIGH (ref 70–99)
Glucose-Capillary: 168 mg/dL — ABNORMAL HIGH (ref 70–99)
Glucose-Capillary: 122 mg/dL — ABNORMAL HIGH (ref 70–99)
Glucose-Capillary: 142 mg/dL — ABNORMAL HIGH (ref 70–99)
Glucose-Capillary: 145 mg/dL — ABNORMAL HIGH (ref 70–99)
Glucose-Capillary: 148 mg/dL — ABNORMAL HIGH (ref 70–99)

## 2019-03-06 LAB — COMPREHENSIVE METABOLIC PANEL
ALT: 26 U/L (ref 0–44)
AST: 28 U/L (ref 15–41)
Albumin: 3.5 g/dL (ref 3.5–5.0)
Alkaline Phosphatase: 36 U/L — ABNORMAL LOW (ref 38–126)
Anion gap: 7 (ref 5–15)
BUN: 9 mg/dL (ref 6–20)
CO2: 22 mmol/L (ref 22–32)
Calcium: 8.4 mg/dL — ABNORMAL LOW (ref 8.9–10.3)
Chloride: 109 mmol/L (ref 98–111)
Creatinine, Ser: 0.9 mg/dL (ref 0.44–1.00)
GFR calc Af Amer: >60 mL/min (ref >60)
GFR calc non Af Amer: >60 mL/min (ref >60)
Glucose, Bld: 176 mg/dL — ABNORMAL HIGH (ref 70–99)
Potassium: 4.1 mmol/L (ref 3.5–5.1)
Sodium: 138 mmol/L (ref 135–145)
Total Bilirubin: 0.9 mg/dL (ref 0.3–1.2)
Total Protein: 5.7 g/dL — ABNORMAL LOW (ref 6.5–8.1)

## 2019-03-06 LAB — CBC
HCT: 25.4 % — ABNORMAL LOW (ref 36.0–46.0)
Hemoglobin: 8 g/dL — ABNORMAL LOW (ref 12.0–15.0)
MCH: 29.2 pg (ref 26.0–34.0)
MCHC: 31.5 g/dL (ref 30.0–36.0)
MCV: 92.7 fL (ref 80.0–100.0)
Platelets: 231 10*3/uL (ref 150–400)
RBC: 2.74 MIL/uL — ABNORMAL LOW (ref 3.87–5.11)
RDW: 12.9 % (ref 11.5–15.5)
WBC: 7.8 10*3/uL (ref 4.0–10.5)
nRBC: 0 % (ref 0.0–0.2)

## 2019-03-06 LAB — MAGNESIUM: Magnesium: 1.9 mg/dL (ref 1.7–2.4)

## 2019-03-06 LAB — PHOSPHORUS: Phosphorus: 1.8 mg/dL — ABNORMAL LOW (ref 2.5–4.6)

## 2019-03-06 NOTE — Progress Notes (Signed)
Patient ID: Alyssa Ross, female   DOB: 1965-10-23, 53 y.o.   MRN: [id number]  2 Days Post-Op Subjective: Pt doing well.  Still no bowel activity.  NG in place.  Pain controlled.  Objective: Vital signs in last 24 hours: Temp:  [98.3 F (36.8 C)-100.6 F (38.1 C)] 99.4 F (37.4 C) (09/26 0800) Pulse Rate:  [51-110] 99 (09/26 0730) Resp:  [13-36] 24 (09/26 0845) BP: (99-143)/(63-91) 141/86 (09/26 0730) SpO2:  [83 %-100 %] 99 % (09/26 0845) Arterial Line BP: (70-157)/(43-142) 121/67 (09/26 0730)  Intake/Output from previous day: 09/25 0701 - 09/26 0700 In: 3666.7 [I.V.:2901.9; NG/GT:60; IV Piggyback:566.8] Out: 2980 [Urine:2450; Emesis/NG output:300; Drains:230] Intake/Output this shift: Total I/O In: -  Out: 325 [Urine:325]  Physical Exam:  General: Alert and oriented Abd: Dressings in place.  Bulb drain with bloody drainage.  Lab Results: Recent Labs    03/04/19 1220 03/05/19 0421 03/06/19 0429  HGB 11.2* 9.4* 8.0*  HCT 33.0* 29.7* 25.4*   BMET Recent Labs    03/05/19 0421 03/06/19 0429  NA 136 138  K 4.1 4.1  CL 108 109  CO2 24 22  GLUCOSE 194* 176*  BUN 15 9  CREATININE 0.78 0.90  CALCIUM 8.1* 8.4*     Studies/Results: No results found.  Assessment/Plan: POD # 2 s/p right partial nephrectomy - Continue to monitor Hgb.  Transfuse if necessary but currently she is hemodynamically stable with excellent UOP.  Continue bedrest until stable. - Monitor renal function, drain output - Path pending    LOS: 2 days   [name] 03/06/2019, 10:23 AM

## 2019-03-06 NOTE — Progress Notes (Signed)
2 Days Post-Op   Subjective/Chief Complaint: Got up to chair.  A little bit of nausea yesterday Pain ok No flatus.  Low grade temp 100 overnight.  IS 750 yesterday   Objective: Vital signs in last 24 hours: Temp:  [98.3 F (36.8 C)-100.6 F (38.1 C)] 99.4 F (37.4 C) (09/26 0800) Pulse Rate:  [51-110] 90 (09/26 1000) Resp:  [15-36] 28 (09/26 1000) BP: (99-143)/(63-91) 141/84 (09/26 1000) SpO2:  [83 %-100 %] 100 % (09/26 1000) Arterial Line BP: (70-157)/(43-142) 121/67 (09/26 0730) Last BM Date: 03/04/19  Intake/Output from previous day: 09/25 0701 - 09/26 0700 In: 3666.7 [I.V.:2901.9; NG/GT:60; IV Piggyback:566.8] Out: 2980 [Urine:2450; Emesis/NG output:300; Drains:230] Intake/Output this shift: Total I/O In: -  Out: 325 [Urine:325]  Pt standing up/ getting ready for walk! cta b/l Reg Soft, min distension, mild approp TTP; drains-serosang No edema  Lab Results:  Recent Labs    03/05/19 0421 03/06/19 0429  WBC 8.0 7.8  HGB 9.4* 8.0*  HCT 29.7* 25.4*  PLT 338 231   BMET Recent Labs    03/05/19 0421 03/06/19 0429  NA 136 138  K 4.1 4.1  CL 108 109  CO2 24 22  GLUCOSE 194* 176*  BUN 15 9  CREATININE 0.78 0.90  CALCIUM 8.1* 8.4*   PT/INR Recent Labs    03/05/19 0421  LABPROT 14.8  INR 1.2   ABG Recent Labs    03/04/19 1045  PHART 7.344*  HCO3 23.9    Studies/Results: No results found.  Anti-infectives: Anti-infectives (From admission, onward)   Start     Dose/Rate Route Frequency Ordered Stop   03/04/19 1500  ceFAZolin (ANCEF) IVPB 2g/100 mL premix     2 g 200 mL/hr over 30 Minutes Intravenous Every 8 hours 03/04/19 1445 03/04/19 1614   03/04/19 0603  ceFAZolin (ANCEF) 2-4 GM/100ML-% IVPB    Note to Pharmacy: Barbie Haggis   : cabinet override      03/04/19 0603 03/04/19 0820   03/04/19 0600  ceFAZolin (ANCEF) IVPB 2g/100 mL premix     2 g 200 mL/hr over 30 Minutes Intravenous On call to O.R. 03/04/19 0555 03/04/19 1220       Assessment/Plan: s/p Procedure(s) with comments: LAPAROSCOPY DIAGNOSTIC (N/A) - GENERAL AND EPIDURAL WHIPPLE PROCEDURE (N/A) - GENERAL AND EPIDURAL Nephrectomy Partial (Right)  Monitor temp curve OOB, ambulate, OOB to chair Cont epidural/pca today Clamp NG tube today, some ice chips Dc/ albumin boluses Add flutter valve Abl anemia - hgb 9.4 to 8 (9/26); repeat cbc in am.  VTE prophylaxis - scds only for now Endo - bs ok. Cont SSI  Appreciate great nursing care in ICU - great to see pt walking  Leighton Ruff. Redmond Pulling, MD, FACS General, Bariatric, & Minimally Invasive Surgery [facility], [place]   LOS: 2 days    Greer Pickerel 03/06/2019

## 2019-03-07 DIAGNOSIS — G8918 Other acute postprocedural pain: Secondary | ICD-10-CM | POA: Diagnosis not present

## 2019-03-07 LAB — GLUCOSE, CAPILLARY
Glucose-Capillary: 156 mg/dL — ABNORMAL HIGH (ref 70–99)
Glucose-Capillary: 166 mg/dL — ABNORMAL HIGH (ref 70–99)
Glucose-Capillary: 162 mg/dL — ABNORMAL HIGH (ref 70–99)
Glucose-Capillary: 138 mg/dL — ABNORMAL HIGH (ref 70–99)
Glucose-Capillary: 141 mg/dL — ABNORMAL HIGH (ref 70–99)

## 2019-03-07 LAB — PHOSPHORUS: Phosphorus: 1.8 mg/dL — ABNORMAL LOW (ref 2.5–4.6)

## 2019-03-07 LAB — CBC
HCT: 25.9 % — ABNORMAL LOW (ref 36.0–46.0)
Hemoglobin: 8.6 g/dL — ABNORMAL LOW (ref 12.0–15.0)
MCH: 29.5 pg (ref 26.0–34.0)
MCHC: 33.2 g/dL (ref 30.0–36.0)
MCV: 88.7 fL (ref 80.0–100.0)
Platelets: 254 10*3/uL (ref 150–400)
RBC: 2.92 MIL/uL — ABNORMAL LOW (ref 3.87–5.11)
RDW: 12.7 % (ref 11.5–15.5)
WBC: 7.5 10*3/uL (ref 4.0–10.5)
nRBC: 0 % (ref 0.0–0.2)

## 2019-03-07 LAB — COMPREHENSIVE METABOLIC PANEL
ALT: 22 U/L (ref 0–44)
AST: 18 U/L (ref 15–41)
Albumin: 3.4 g/dL — ABNORMAL LOW (ref 3.5–5.0)
Alkaline Phosphatase: 41 U/L (ref 38–126)
Anion gap: 7 (ref 5–15)
BUN: <5 mg/dL — ABNORMAL LOW (ref 6–20)
CO2: 23 mmol/L (ref 22–32)
Calcium: 8.9 mg/dL (ref 8.9–10.3)
Chloride: 108 mmol/L (ref 98–111)
Creatinine, Ser: 0.8 mg/dL (ref 0.44–1.00)
GFR calc Af Amer: >60 mL/min (ref >60)
GFR calc non Af Amer: >60 mL/min (ref >60)
Glucose, Bld: 157 mg/dL — ABNORMAL HIGH (ref 70–99)
Potassium: 4 mmol/L (ref 3.5–5.1)
Sodium: 138 mmol/L (ref 135–145)
Total Bilirubin: 0.7 mg/dL (ref 0.3–1.2)
Total Protein: 5.7 g/dL — ABNORMAL LOW (ref 6.5–8.1)

## 2019-03-07 LAB — MAGNESIUM: Magnesium: 1.7 mg/dL (ref 1.7–2.4)

## 2019-03-07 LAB — CREATININE, FLUID (PLEURAL, PERITONEAL, JP DRAINAGE): Creat, Fluid: 0.9 mg/dL

## 2019-03-07 MED ORDER — MORPHINE SULFATE (PF) 2 MG/ML IV SOLN
2.0000 mg | INTRAVENOUS | Status: DC | PRN
  Administered 2019-03-09: 2 mg via INTRAVENOUS
  Filled 2019-03-07: qty 1

## 2019-03-07 NOTE — Progress Notes (Signed)
24 ml of Dilaudid PCA syringe wasted via sharps box. Witnessed by My K, RN

## 2019-03-07 NOTE — Progress Notes (Signed)
Report given to Massie Maroon, RN on 6N. Patient having a feeling of "pulled muscle" on the anterior of her left thigh. Spoke with Dr. Marcie Bal and he felt it was "highly unlikely" related to the epidural. Will update Kamron Portee on 6N.

## 2019-03-07 NOTE — Plan of Care (Signed)
Blood pressure and oxygenation status on room air have been stable. Patient's activity level has increased. Patient is still anxious, annoyed with all the tubes/alarms, but is handling it well. Patient's pain has been well controlled this shift, she has hardly used her PCA.

## 2019-03-07 NOTE — Progress Notes (Signed)
3 Days Post-Op   Subjective/Chief Complaint: Up in chair Nausea resolved NG clamped since yesterday Pain well-controlled with epidural No flatus yet, but she does feel some "gurgling" Afebrile   Objective: Vital signs in last 24 hours: Temp:  [98.5 F (36.9 C)-99.9 F (37.7 C)] 99.5 F (37.5 C) (09/27 0800) Pulse Rate:  [79-107] 107 (09/27 0911) Resp:  [17-28] 22 (09/27 0911) BP: (136-174)/(70-121) 137/95 (09/27 0911) SpO2:  [95 %-99 %] 96 % (09/27 0911) Last BM Date: 03/04/19  Intake/Output from previous day: 09/26 0701 - 09/27 0700 In: 2562.5 [I.V.:2368.6; IV Piggyback:50] Out: 2715 [Urine:2475; Drains:240] Intake/Output this shift: Total I/O In: 85.9 [I.V.:79.9; Other:6] Out: 400 [Urine:300; Drains:100]  WDWN in NAD Lungs - CTA B CV - RRR Abd - soft, minimal distention; + BS, drains - serosanguinous  Lab Results:  Recent Labs    03/06/19 0429 03/07/19 0239  WBC 7.8 7.5  HGB 8.0* 8.6*  HCT 25.4* 25.9*  PLT 231 254   BMET Recent Labs    03/06/19 0429 03/07/19 0239  NA 138 138  K 4.1 4.0  CL 109 108  CO2 22 23  GLUCOSE 176* 157*  BUN 9 <5*  CREATININE 0.90 0.80  CALCIUM 8.4* 8.9   PT/INR Recent Labs    03/05/19 0421  LABPROT 14.8  INR 1.2   ABG No results for input(s): PHART, HCO3 in the last 72 hours.  Invalid input(s): PCO2, PO2  Studies/Results: No results found.  Anti-infectives: Anti-infectives (From admission, onward)   Start     Dose/Rate Route Frequency Ordered Stop   03/04/19 1500  ceFAZolin (ANCEF) IVPB 2g/100 mL premix     2 g 200 mL/hr over 30 Minutes Intravenous Every 8 hours 03/04/19 1445 03/04/19 1614   03/04/19 0603  ceFAZolin (ANCEF) 2-4 GM/100ML-% IVPB    Note to Pharmacy: Barbie Haggis   : cabinet override      03/04/19 0603 03/04/19 0820   03/04/19 0600  ceFAZolin (ANCEF) IVPB 2g/100 mL premix     2 g 200 mL/hr over 30 Minutes Intravenous On call to O.R. 03/04/19 0555 03/04/19 1220       Assessment/Plan: s/p Procedure(s) with comments: LAPAROSCOPY DIAGNOSTIC (N/A) - GENERAL AND EPIDURAL WHIPPLE PROCEDURE (N/A) - GENERAL AND EPIDURAL Nephrectomy Partial (Right)  D/C NG tube Continue epidural/ D/C PCA ABL anemia - stable  Transfer to floor  LOS: 3 days    Alyssa Ross 03/07/2019

## 2019-03-07 NOTE — Progress Notes (Signed)
Patient progressing activity well, today. Ambulated in unit twice using front-wheel walker and standby assist and tolerated sitting in chair approx 6.5 hrs. Pain well improved throughout the day with no use of PCA dilaudid after 8 am recording. Tolerating few ice chips with no nausea.

## 2019-03-07 NOTE — Addendum Note (Signed)
Addendum  created 03/07/19 4081 by Albertha Ghee, MD   Clinical Note Signed

## 2019-03-07 NOTE — Anesthesia Post-op Follow-up Note (Signed)
  Anesthesia Pain Follow-up Note  Patient: Alyssa Ross  Day #: 3  Date of Follow-up: 03/07/2019 Time: 9:02 AM  Last Vitals:  Vitals:   03/07/19 0800 03/07/19 0827  BP:    Pulse:    Resp:  (!) 22  Temp: 37.5 C   SpO2:  98%    Level of Consciousness: alert  Pain: none   Side Effects:None  Catheter Site Exam:clean, dry, no drainage  Epidural / Intrathecal (From admission, onward)   Start     Dose/Rate Route Frequency Ordered Stop   03/04/19 2145  ropivacaine (PF) 2 mg/mL (0.2%) (NAROPIN) injection     6 mL/hr 6 mL/hr  Epidural Continuous 03/04/19 2131 03/09/19 2144       Plan: Continue current therapy of postop epidural at surgeon's request  [place] S

## 2019-03-07 NOTE — Progress Notes (Addendum)
Patient ID: Alyssa Ross, female   DOB: 1966-05-24, 53 y.o.   MRN: [id number] 3 Days Post-Op Subjective: Doing well.  Ambulated yesterday.  NG clamped.  Objective: Vital signs in last 24 hours: Temp:  [98.5 F (36.9 C)-99.9 F (37.7 C)] 99.5 F (37.5 C) (09/27 0800) Pulse Rate:  [79-107] 107 (09/27 0911) Resp:  [17-28] 22 (09/27 0911) BP: (136-174)/(70-121) 137/95 (09/27 0911) SpO2:  [95 %-100 %] 96 % (09/27 0911)  Intake/Output from previous day: 09/26 0701 - 09/27 0700 In: 2562.5 [I.V.:2368.6; IV Piggyback:50] Out: 2715 [Urine:2475; Drains:240] Intake/Output this shift: Total I/O In: 85.9 [I.V.:79.9; Other:6] Out: 400 [Urine:300; Drains:100]  Physical Exam:  General: Alert and oriented Abd: Dressings in place, drain with minimal serosanguinous drainage  Lab Results: Recent Labs    03/05/19 0421 03/06/19 0429 03/07/19 0239  HGB 9.4* 8.0* 8.6*  HCT 29.7* 25.4* 25.9*   BMET Recent Labs    03/06/19 0429 03/07/19 0239  NA 138 138  K 4.1 4.0  CL 109 108  CO2 22 23  GLUCOSE 176* 157*  BUN 9 <5*  CREATININE 0.90 0.80  CALCIUM 8.4* 8.9     Studies/Results: No results found.  Assessment/Plan: POD # 3 s/p right partial nephrectomy and Whipple - Hgb stabilized.  Will send bulb drain fluid for Cr. - Pathology pending   LOS: 3 days   [name] 03/07/2019, 10:10 AM

## 2019-03-07 NOTE — Progress Notes (Signed)
Patient transferred to unit from ICU, VSS, no pain. Patient settled in bed and oriented to unit routine. Pt sitting comfortably in chair with call bell in reach. Instructed patient to utilize call bell for assistance. Will continue to monitor closely for remainder of shift.

## 2019-03-08 DIAGNOSIS — G8918 Other acute postprocedural pain: Secondary | ICD-10-CM | POA: Diagnosis not present

## 2019-03-08 LAB — GLUCOSE, CAPILLARY
Glucose-Capillary: 105 mg/dL — ABNORMAL HIGH (ref 70–99)
Glucose-Capillary: 110 mg/dL — ABNORMAL HIGH (ref 70–99)
Glucose-Capillary: 111 mg/dL — ABNORMAL HIGH (ref 70–99)
Glucose-Capillary: 127 mg/dL — ABNORMAL HIGH (ref 70–99)
Glucose-Capillary: 154 mg/dL — ABNORMAL HIGH (ref 70–99)
Glucose-Capillary: 169 mg/dL — ABNORMAL HIGH (ref 70–99)

## 2019-03-08 LAB — CBC
HCT: 26.7 % — ABNORMAL LOW (ref 36.0–46.0)
Hemoglobin: 8.4 g/dL — ABNORMAL LOW (ref 12.0–15.0)
MCH: 28.8 pg (ref 26.0–34.0)
MCHC: 31.5 g/dL (ref 30.0–36.0)
MCV: 91.4 fL (ref 80.0–100.0)
Platelets: 326 10*3/uL (ref 150–400)
RBC: 2.92 MIL/uL — ABNORMAL LOW (ref 3.87–5.11)
RDW: 12.7 % (ref 11.5–15.5)
WBC: 7.3 10*3/uL (ref 4.0–10.5)
nRBC: 0 % (ref 0.0–0.2)

## 2019-03-08 LAB — COMPREHENSIVE METABOLIC PANEL
ALT: 17 U/L (ref 0–44)
AST: 16 U/L (ref 15–41)
Albumin: 3 g/dL — ABNORMAL LOW (ref 3.5–5.0)
Alkaline Phosphatase: 42 U/L (ref 38–126)
Anion gap: 7 (ref 5–15)
BUN: 5 mg/dL — ABNORMAL LOW (ref 6–20)
CO2: 25 mmol/L (ref 22–32)
Calcium: 8.9 mg/dL (ref 8.9–10.3)
Chloride: 108 mmol/L (ref 98–111)
Creatinine, Ser: 0.85 mg/dL (ref 0.44–1.00)
GFR calc Af Amer: >60 mL/min (ref >60)
GFR calc non Af Amer: >60 mL/min (ref >60)
Glucose, Bld: 140 mg/dL — ABNORMAL HIGH (ref 70–99)
Potassium: 3.7 mmol/L (ref 3.5–5.1)
Sodium: 140 mmol/L (ref 135–145)
Total Bilirubin: 0.4 mg/dL (ref 0.3–1.2)
Total Protein: 5.7 g/dL — ABNORMAL LOW (ref 6.5–8.1)

## 2019-03-08 NOTE — Progress Notes (Signed)
Patient ID: Alyssa Ross, female   DOB: 1966-04-15, 53 y.o.   MRN: [id number] 4 Days Post-Op   Subjective: Doing well.  Ambulating.  JP Cr negative for urine leak yesterday.   Objective: Vital signs in last 24 hours: Temp:  [98 F (36.7 C)-98.9 F (37.2 C)] 98.7 F (37.1 C) (09/28 1426) Pulse Rate:  [84-105] 96 (09/28 1426) Resp:  [16-20] 16 (09/28 1426) BP: (105-149)/(71-92) 132/89 (09/28 1426) SpO2:  [96 %-100 %] 99 % (09/28 1426)  Intake/Output from previous day: 09/27 0701 - 09/28 0700 In: 1774.6 [I.V.:1642.6] Out: 3020 [Urine:2725; Drains:295] Intake/Output this shift: No intake/output data recorded.  Physical Exam:  General: Alert and oriented Abd: Dressings in place, drain with minimal serosanguinous drainage GU: Foley with clear yellow urine, no sediment or hematuria  Lab Results: Recent Labs    03/06/19 0429 03/07/19 0239 03/08/19 0224  HGB 8.0* 8.6* 8.4*  HCT 25.4* 25.9* 26.7*   BMET Recent Labs    03/07/19 0239 03/08/19 0224  NA 138 140  K 4.0 3.7  CL 108 108  CO2 23 25  GLUCOSE 157* 140*  BUN <5* 5*  CREATININE 0.80 0.85  CALCIUM 8.9 8.9     Studies/Results: No results found.  Assessment/Plan: POD # 4 s/p right partial nephrectomy and Whipple - Pathology pending - We will defer JP drain removal and foley catheter removal to primary team.  - We will schedule patient to follow up in one week for discussion of pathology results with Dr Alyson Ingles at [facility]. Patient is aware of urology follow up plan as this was discussed with her today.   LOS: 4 days   Haskel Schroeder 03/08/2019, 3:35 PM

## 2019-03-08 NOTE — Progress Notes (Signed)
4 Days Post-Op   Subjective/Chief Complaint: NGT removed yesterday.  No pain.  No n/v.  + flatus.    Objective: Vital signs in last 24 hours: Temp:  [98 F (36.7 C)-98.9 F (37.2 C)] 98 F (36.7 C) (09/28 1014) Pulse Rate:  [84-105] 105 (09/28 1014) Resp:  [16-20] 17 (09/28 1014) BP: (105-149)/(71-94) 116/83 (09/28 1014) SpO2:  [96 %-100 %] 98 % (09/28 1014) Last BM Date: 03/07/19  Intake/Output from previous day: 09/27 0701 - 09/28 0700 In: 1774.6 [I.V.:1642.6] Out: 3020 [Urine:2725; Drains:295] Intake/Output this shift: No intake/output data recorded.  WDWN in NAD Lungs - breathing comfortably  CV - RRR Abd - soft, non distended, approp tender.  Dressing c/d/i.   drains - serosanguinous.   Lab Results:  Recent Labs    03/07/19 0239 03/08/19 0224  WBC 7.5 7.3  HGB 8.6* 8.4*  HCT 25.9* 26.7*  PLT 254 326   BMET Recent Labs    03/07/19 0239 03/08/19 0224  NA 138 140  K 4.0 3.7  CL 108 108  CO2 23 25  GLUCOSE 157* 140*  BUN <5* 5*  CREATININE 0.80 0.85  CALCIUM 8.9 8.9   PT/INR No results for input(s): LABPROT, INR in the last 72 hours. ABG No results for input(s): PHART, HCO3 in the last 72 hours.  Invalid input(s): PCO2, PO2  Studies/Results: No results found.  Anti-infectives: Anti-infectives (From admission, onward)   Start     Dose/Rate Route Frequency Ordered Stop   03/04/19 1500  ceFAZolin (ANCEF) IVPB 2g/100 mL premix     2 g 200 mL/hr over 30 Minutes Intravenous Every 8 hours 03/04/19 1445 03/04/19 1614   03/04/19 0603  ceFAZolin (ANCEF) 2-4 GM/100ML-% IVPB    Note to Pharmacy: Barbie Haggis   : cabinet override      03/04/19 0603 03/04/19 0820   03/04/19 0600  ceFAZolin (ANCEF) IVPB 2g/100 mL premix     2 g 200 mL/hr over 30 Minutes Intravenous On call to O.R. 03/04/19 0555 03/04/19 1220      Assessment/Plan: s/p Procedure(s) with comments: LAPAROSCOPY DIAGNOSTIC (N/A) - GENERAL AND EPIDURAL WHIPPLE PROCEDURE (N/A) - GENERAL  AND EPIDURAL Nephrectomy Partial (Right)  Clears. Plan fulls tomorrow and d/c PCA tomorrow. Continue epidural.  Plan d/c foley after epidural comes out tomorrow.   ABL anemia - stable   LOS: 4 days    Stark Klein 03/08/2019

## 2019-03-08 NOTE — Anesthesia Post-op Follow-up Note (Signed)
  Anesthesia Pain Follow-up Note  Patient: Alyssa Ross  Day #: 4  Date of Follow-up: 03/08/2019 Time: 5:57 PM  Last Vitals:  Vitals:   03/08/19 1014 03/08/19 1426  BP: 116/83 132/89  Pulse: (!) 105 96  Resp: 17 16  Temp: 36.7 C 37.1 C  SpO2: 98% 99%    Level of Consciousness: alert  Pain: none   Side Effects:None  Catheter Site Exam:clean, dry, no drainage  Epidural / Intrathecal (From admission, onward)   Start     Dose/Rate Route Frequency Ordered Stop   03/04/19 2145  ropivacaine (PF) 2 mg/mL (0.2%) (NAROPIN) injection     6 mL/hr 6 mL/hr  Epidural Continuous 03/04/19 2131 03/09/19 2144       Plan: Continue current therapy of postop epidural at surgeon's request  Nolon Nations

## 2019-03-08 NOTE — Plan of Care (Signed)
  Problem: Education: Goal: Knowledge of General Education information will improve Description: Including pain rating scale, medication(s)/side effects and non-pharmacologic comfort measures Outcome: Progressing   Problem: Health Behavior/Discharge Planning: Goal: Ability to manage health-related needs will improve Outcome: Progressing   Problem: Clinical Measurements: Goal: Respiratory complications will improve Outcome: Progressing Goal: Cardiovascular complication will be avoided Outcome: Progressing   Problem: Activity: Goal: Risk for activity intolerance will decrease Outcome: Progressing   Problem: Nutrition: Goal: Adequate nutrition will be maintained Outcome: Progressing   Problem: Coping: Goal: Level of anxiety will decrease Outcome: Progressing   Problem: Elimination: Goal: Will not experience complications related to urinary retention Outcome: Progressing   Problem: Pain Managment: Goal: General experience of comfort will improve Outcome: Progressing   Problem: Safety: Goal: Ability to remain free from injury will improve Outcome: Progressing   Problem: Skin Integrity: Goal: Risk for impaired skin integrity will decrease Outcome: Progressing   

## 2019-03-08 NOTE — Progress Notes (Signed)
46mL Ropivacaine epidural wasted with Ivor Costa, RN. Will continue to monitor.

## 2019-03-09 DIAGNOSIS — G8918 Other acute postprocedural pain: Secondary | ICD-10-CM | POA: Diagnosis not present

## 2019-03-09 LAB — COMPREHENSIVE METABOLIC PANEL
ALT: 15 U/L (ref 0–44)
AST: 13 U/L — ABNORMAL LOW (ref 15–41)
Albumin: 3 g/dL — ABNORMAL LOW (ref 3.5–5.0)
Alkaline Phosphatase: 42 U/L (ref 38–126)
Anion gap: 9 (ref 5–15)
BUN: 6 mg/dL (ref 6–20)
CO2: 23 mmol/L (ref 22–32)
Calcium: 8.9 mg/dL (ref 8.9–10.3)
Chloride: 106 mmol/L (ref 98–111)
Creatinine, Ser: 0.82 mg/dL (ref 0.44–1.00)
GFR calc Af Amer: >60 mL/min (ref >60)
GFR calc non Af Amer: >60 mL/min (ref >60)
Glucose, Bld: 112 mg/dL — ABNORMAL HIGH (ref 70–99)
Potassium: 3.5 mmol/L (ref 3.5–5.1)
Sodium: 138 mmol/L (ref 135–145)
Total Bilirubin: 0.8 mg/dL (ref 0.3–1.2)
Total Protein: 5.8 g/dL — ABNORMAL LOW (ref 6.5–8.1)

## 2019-03-09 LAB — GLUCOSE, CAPILLARY
Glucose-Capillary: 115 mg/dL — ABNORMAL HIGH (ref 70–99)
Glucose-Capillary: 128 mg/dL — ABNORMAL HIGH (ref 70–99)
Glucose-Capillary: 130 mg/dL — ABNORMAL HIGH (ref 70–99)
Glucose-Capillary: 135 mg/dL — ABNORMAL HIGH (ref 70–99)
Glucose-Capillary: 187 mg/dL — ABNORMAL HIGH (ref 70–99)
Glucose-Capillary: 193 mg/dL — ABNORMAL HIGH (ref 70–99)
Glucose-Capillary: 206 mg/dL — ABNORMAL HIGH (ref 70–99)

## 2019-03-09 LAB — CBC
HCT: 26.9 % — ABNORMAL LOW (ref 36.0–46.0)
Hemoglobin: 8.5 g/dL — ABNORMAL LOW (ref 12.0–15.0)
MCH: 28.2 pg (ref 26.0–34.0)
MCHC: 31.6 g/dL (ref 30.0–36.0)
MCV: 89.4 fL (ref 80.0–100.0)
Platelets: 367 10*3/uL (ref 150–400)
RBC: 3.01 MIL/uL — ABNORMAL LOW (ref 3.87–5.11)
RDW: 12.8 % (ref 11.5–15.5)
WBC: 6.9 10*3/uL (ref 4.0–10.5)
nRBC: 0 % (ref 0.0–0.2)

## 2019-03-09 MED ORDER — BACLOFEN 10 MG PO TABS
10.0000 mg | ORAL_TABLET | Freq: Three times a day (TID) | ORAL | Status: DC | PRN
  Administered 2019-03-09: 10 mg via ORAL
  Filled 2019-03-09: qty 1

## 2019-03-09 MED ORDER — ENSURE ENLIVE PO LIQD
237.0000 mL | Freq: Three times a day (TID) | ORAL | Status: DC
  Administered 2019-03-09 – 2019-03-14 (×8): 237 mL via ORAL

## 2019-03-09 MED ORDER — ACETAMINOPHEN 325 MG PO TABS: 650.0000 mg | ORAL_TABLET | Freq: Four times a day (QID) | ORAL | Status: DC | PRN

## 2019-03-09 MED ORDER — TRAMADOL HCL 50 MG PO TABS: 50.0000 mg | ORAL_TABLET | Freq: Four times a day (QID) | ORAL | Status: DC | PRN

## 2019-03-09 MED ORDER — OXYCODONE HCL 5 MG PO TABS
5.0000 mg | ORAL_TABLET | Freq: Once | ORAL | Status: AC
  Administered 2019-03-09: 5 mg via ORAL

## 2019-03-09 MED ORDER — OXYCODONE HCL 5 MG PO TABS
5.0000 mg | ORAL_TABLET | ORAL | Status: DC | PRN
  Administered 2019-03-09: 10 mg via ORAL
  Administered 2019-03-09: 5 mg via ORAL
  Administered 2019-03-10 (×2): 10 mg via ORAL
  Administered 2019-03-10 – 2019-03-11 (×3): 5 mg via ORAL
  Filled 2019-03-09: qty 2
  Filled 2019-03-09 (×2): qty 1
  Filled 2019-03-09: qty 2
  Filled 2019-03-09 (×4): qty 1
  Filled 2019-03-09: qty 2

## 2019-03-09 MED ORDER — PANTOPRAZOLE SODIUM 40 MG PO TBEC
40.0000 mg | DELAYED_RELEASE_TABLET | Freq: Every day | ORAL | Status: DC
  Administered 2019-03-09 – 2019-03-13 (×5): 40 mg via ORAL
  Filled 2019-03-09 (×4): qty 1

## 2019-03-09 NOTE — Progress Notes (Signed)
5 Days Post-Op   Subjective/Chief Complaint: Tolerating clears.  + flatus/BM.  No n/v.  Ambulating.  JP creatinine normal laterally.    Objective: Vital signs in last 24 hours: Temp:  [98 F (36.7 C)-99.9 F (37.7 C)] 99 F (37.2 C) (09/29 0413) Pulse Rate:  [77-105] 77 (09/29 0413) Resp:  [16-20] 20 (09/29 0413) BP: (116-145)/(83-89) 141/87 (09/29 0413) SpO2:  [97 %-99 %] 97 % (09/29 0413) Last BM Date: 03/08/19  Intake/Output from previous day: 09/28 0701 - 09/29 0700 In: 2110.5 [P.O.:360; I.V.:1652.5; IV Piggyback:50] Out: 9030 [Urine:1550; Drains:210] Intake/Output this shift: No intake/output data recorded.  WDWN in NAD Lungs - breathing comfortably CV - RRR Abd - soft, non distended, approp tender.  Dressing c/d/i.   drains - serosanguinous.   Lab Results:  Recent Labs    03/08/19 0224 03/09/19 0233  WBC 7.3 6.9  HGB 8.4* 8.5*  HCT 26.7* 26.9*  PLT 326 367   BMET Recent Labs    03/08/19 0224 03/09/19 0233  NA 140 138  K 3.7 3.5  CL 108 106  CO2 25 23  GLUCOSE 140* 112*  BUN 5* 6  CREATININE 0.85 0.82  CALCIUM 8.9 8.9   PT/INR No results for input(s): LABPROT, INR in the last 72 hours. ABG No results for input(s): PHART, HCO3 in the last 72 hours.  Invalid input(s): PCO2, PO2  Studies/Results: No results found.  Anti-infectives: Anti-infectives (From admission, onward)   Start     Dose/Rate Route Frequency Ordered Stop   03/04/19 1500  ceFAZolin (ANCEF) IVPB 2g/100 mL premix     2 g 200 mL/hr over 30 Minutes Intravenous Every 8 hours 03/04/19 1445 03/04/19 1614   03/04/19 0603  ceFAZolin (ANCEF) 2-4 GM/100ML-% IVPB    Note to Pharmacy: Barbie Haggis   : cabinet override      03/04/19 0603 03/04/19 0820   03/04/19 0600  ceFAZolin (ANCEF) IVPB 2g/100 mL premix     2 g 200 mL/hr over 30 Minutes Intravenous On call to O.R. 03/04/19 0555 03/04/19 1220      Assessment/Plan: s/p Procedure(s) with comments: LAPAROSCOPY DIAGNOSTIC (N/A) -  GENERAL AND EPIDURAL WHIPPLE PROCEDURE (N/A) - GENERAL AND EPIDURAL Nephrectomy Partial (Right)  Full liquid diet this Am, soft diet tonight.   D/c epidural today; I have discussed with anesthesia.  Plan d/c foley after epidural comes out tomorrow.   ABL anemia - stable  Pathology pending.     LOS: 5 days    Alyssa Ross 03/09/2019

## 2019-03-09 NOTE — Plan of Care (Signed)
  Problem: Respiratory: Goal: Will regain and/or maintain adequate ventilation Outcome: Progressing   Problem: Skin Integrity: Goal: Wound healing without signs and symptoms of infection will improve Outcome: Progressing Goal: Risk for impaired skin integrity will decrease Outcome: Progressing

## 2019-03-09 NOTE — Progress Notes (Signed)
Initial Nutrition Assessment  DOCUMENTATION CODES:   Obesity unspecified  INTERVENTION:   Calorie Count x 48 hours.  Ensure Enlive po BID, each supplement provides 350 kcal and 20 grams of protein.  RD to follow for calorie count results and diet education.  NUTRITION DIAGNOSIS:   Increased nutrient needs related to acute illness(pancreatic cancer s/p Whipple procedure) as evidenced by estimated needs.  GOAL:   Patient will meet greater than or equal to 90% of their needs  MONITOR:   PO intake, Supplement acceptance, Labs, I & O's  REASON FOR ASSESSMENT:   Consult Calorie Count  ASSESSMENT:   53 yo female admitted with pancreatic cancer s/p Whipple on 9/24. PMH includes DM, HTN.   Patient reports that she was remotely seeing a dietitian in [place] prior to being diagnosed with cancer for weight loss. She has lost 30 lbs. She typically has a good appetite and eats well. Currently she is scared to eat. She requested information regarding nutrition for Whipple patients. She is lactose intolerant and does not eat cheese or drink milk. She agreed to try Ensure Enlive supplements since they are lactose free. Plans for full liquids for lunch today and advance to soft diet for supper.   Labs reviewed.  CBG's: 111-128-115-130  Medications reviewed and include novolog, lantus.  Weight 93 kg two months ago.  Admission weight 99.2 kg  NUTRITION - FOCUSED PHYSICAL EXAM:    Most Recent Value  Orbital Region  No depletion  Upper Arm Region  Mild depletion  Thoracic and Lumbar Region  No depletion  Buccal Region  No depletion  Temple Region  Mild depletion  Clavicle Bone Region  No depletion  Clavicle and Acromion Bone Region  No depletion  Scapular Bone Region  No depletion  Dorsal Hand  Mild depletion  Patellar Region  No depletion  Anterior Thigh Region  No depletion  Posterior Calf Region  Mild depletion  Edema (RD Assessment)  None  Hair  Reviewed  Eyes  Reviewed  Mouth   Reviewed  Skin  Reviewed  Nails  Reviewed       Diet Order:   Diet Order            DIET SOFT Room service appropriate? Yes; Fluid consistency: Thin  Diet effective 1400        Diet full liquid Room service appropriate? Yes; Fluid consistency: Thin  Diet effective now              EDUCATION NEEDS:   Not appropriate for education at this time  Skin:  Skin Assessment: Reviewed RN Assessment  Last BM:  9/28  Height:   Ht Readings from Last 1 Encounters:  03/04/19 5' 8.5" (1.74 m)    Weight:   Wt Readings from Last 1 Encounters:  03/04/19 102.6 kg    Ideal Body Weight:  64.8 kg  BMI:  Body mass index is 33.89 kg/m.  Estimated Nutritional Needs:   Kcal:  2100-2300  Protein:  110-130 gm  Fluid:  >/= 2 L    Molli Barrows, RD, LDN, [place] Pager [phone] After Hours Pager [phone]

## 2019-03-09 NOTE — Anesthesia Post-op Follow-up Note (Signed)
  Anesthesia Pain Follow-up Note  Patient: Alyssa Ross  Day #: 5  Date of Follow-up: 03/09/2019 Time: 5:55 PM  Last Vitals:  Vitals:   03/09/19 0413 03/09/19 1520  BP: (!) 141/87 (!) 154/93  Pulse: 77 85  Resp: 20 18  Temp: 37.2 C 36.9 C  SpO2: 97% 97%    Level of Consciousness: alert  Pain: none   Side Effects:None  Catheter Site Exam:clean, dry  Epidural / Intrathecal (From admission, onward)   Start     Dose/Rate Route Frequency Ordered Stop   03/04/19 2145  ropivacaine (PF) 2 mg/mL (0.2%) (NAROPIN) injection     6 mL/hr 6 mL/hr  Epidural Continuous 03/04/19 2131 03/09/19 2144       Plan: Catheter removed/tip intact at surgeon's request  Jamail Cullers

## 2019-03-10 LAB — GLUCOSE, CAPILLARY
Glucose-Capillary: 121 mg/dL — ABNORMAL HIGH (ref 70–99)
Glucose-Capillary: 122 mg/dL — ABNORMAL HIGH (ref 70–99)
Glucose-Capillary: 145 mg/dL — ABNORMAL HIGH (ref 70–99)
Glucose-Capillary: 156 mg/dL — ABNORMAL HIGH (ref 70–99)
Glucose-Capillary: 162 mg/dL — ABNORMAL HIGH (ref 70–99)
Glucose-Capillary: 169 mg/dL — ABNORMAL HIGH (ref 70–99)

## 2019-03-10 MED ORDER — LISINOPRIL 5 MG PO TABS
5.0000 mg | ORAL_TABLET | Freq: Every evening | ORAL | Status: DC
  Administered 2019-03-10 – 2019-03-13 (×4): 5 mg via ORAL
  Filled 2019-03-10 (×4): qty 1

## 2019-03-10 MED ORDER — LISINOPRIL-HYDROCHLOROTHIAZIDE 10-12.5 MG PO TABS: 0.5000 | ORAL_TABLET | Freq: Every evening | ORAL | Status: DC

## 2019-03-10 MED ORDER — GABAPENTIN 100 MG PO CAPS
100.0000 mg | ORAL_CAPSULE | Freq: Two times a day (BID) | ORAL | Status: DC
  Administered 2019-03-10 – 2019-03-14 (×8): 100 mg via ORAL
  Filled 2019-03-10 (×8): qty 1

## 2019-03-10 MED ORDER — HYDROCHLOROTHIAZIDE 10 MG/ML ORAL SUSPENSION
6.2500 mg | Freq: Every evening | ORAL | Status: DC
  Administered 2019-03-10 – 2019-03-13 (×4): 6.25 mg via ORAL
  Filled 2019-03-10 (×6): qty 1.25

## 2019-03-10 MED ORDER — PROMETHAZINE HCL 25 MG/ML IJ SOLN
12.5000 mg | Freq: Four times a day (QID) | INTRAMUSCULAR | Status: DC | PRN
  Administered 2019-03-10 (×2): 12.5 mg via INTRAVENOUS
  Administered 2019-03-11: 25 mg via INTRAVENOUS
  Filled 2019-03-10 (×3): qty 1

## 2019-03-10 NOTE — Discharge Instructions (Addendum)
Whipple Surgery Nutrition Therapy   This surgery removes part of the pancreas, intestine, stomach, bile duct, and all of the gall bladder. As a result of the surgery, the amount of food you can eat at one time and how your body handles food will change. Goals  To provide good nutrition after surgery by eating adequate amounts of a variety of foods  To promote healing after surgery  To prevent or decrease problems related to eating  To prevent too much weight loss Tips Eating Tips  Eat small, frequent meals (5 to 6 meals per day). After surgery, you will feel full quickly and will be able to eat only small amounts at a time.  Stop eating when you feel full.  Eat slowly and chew your foods very well.  Avoid foods that are known to cause you problems. Otherwise, eat the foods you like. Symptoms usually get better over time.  Eat a variety of foods.  To help with healing, eat foods high in protein, such as tender meats, poultry, fish, dairy products, eggs, peanut butter, and beans. Drink supplements such as Boost, Ensure, or [employer].  At first, you may have problems tolerating fatty foods.  For the first few weeks, avoid drinking large amounts of fluid with meals. Small sips are OK. Drink most fluids 30 minutes before and after meals. Drink 48 to 64 ounces (6 to 8 cups) of fluid throughout the day.  Try not to lose weight, even if you are overweight, because it can make you feel weaker and can delay healing. Your registered dietitian nutritionist can help you with ideas for maintaining your weight if needed.  Possible Problems and Tips Stomach Empties Too Slowly after Eating This may occur in one-quarter to half of patients after surgery, and it usually gets better within a few weeks to months. Symptoms Nausea, vomiting of undigested food, bloating, early fullness, and abdominal pain Tips  Eat small, frequent meals.  Chew foods very well.  Liquids may work  better than solids.  Low-fat, low-fiber soft foods may work better than high-fat, high-fiber tough foods.  Take a walk after eating to help move food through your system faster.  Ask your doctor if you will need medicine to help your stomach empty faster. Dumping Syndrome Early symptoms may occur 30 to 60 minutes after eating and are caused when food from the stomach (especially sugar) passes too quickly into the intestine. Symptoms Dizziness, sweating, fast heart rate, bloating, nausea, and diarrhea (30 to 60 minutes after eating). Later symptoms (2 to 3 hours after a meal) include feelings of weakness, hunger, and fast heart rate. Tips  Avoid foods high in sugar (not more than 12 grams of sugar per serving).  Drink fluid 30 minutes before or after meals, not with meals.  Eat 5 to 6 small meals per day.  Lie down for up to 30 minutes after meals.  Try foods high in soluble fiber, including apples, apricots, bananas, blackberries, nectarines, oranges, grapefruit, pears, plums, strawberries, tangerines, asparagus, broccoli, brussels sprouts, carrots, kale, okra, spinach, mustard greens, sweet potato, oatmeal, oat bran, beans (black, kidney, lima, navy), or commercial fiber supplements.  Decreased Pancreas Function Caused by disease of the pancreas or the removal of part of the pancreas. Symptoms Changes in stools (foul smelling, oily, frothy, very light in color) and weight loss even though you are eating plenty of calories. Tips  Take pancreatic enzymes per your doctors orders, just before meals and snacks.  A low-fat  diet is usually not needed if you are taking pancreatic enzymes. However, if your doctor advises you to follow a low-fat diet to see if symptoms improve, be sure to get in enough calories to prevent weight loss. Diabetes Caused by decreased insulin production by the pancreas after surgery. Tips  Monitor your blood glucose (sugar) as advised by your doctor  Control  your blood glucose through diet and medicine as prescribed by your doctor.  Your doctor, registered dietitian nutritionist, or diabetes educator will teach you about the diet that will be right for you. Low Nutrient Levels Caused by not eating enough nutrients or by not digesting or absorbing nutrients normally after surgery. Most common nutrients: iron, calcium zinc, copper, selenium, vitamins A, E, D, and K. Tips  Eat a variety of foods daily.  Take a daily vitamin/mineral supplement.  Follow your doctors advice about taking extra supplements. Extra calcium (500 to 1,000 milligrams per day) and vitamin D (600 to 1,000 units per day) may be suggested by your doctor.  Lactose Intolerance Caused by a decrease in the enzyme in your intestine that digests lactose. Symptoms Gas, bloating, or diarrhea after drinking milk or eating milk products Tips  Try lactose-free milk choices (such as soy milk or almond milk).  Try reduced-lactose products (such as Lactaid or Dairy Ease products).  Try lactase enzyme tablets when you eat dairy products.  Try yogurt or cheese instead of milk. Bacteria Growth in the Small Intestine Symptoms Nausea, gas, bloating, diarrhea, low vitamin B-12 and high folate in the blood. Tip Ask doctor if treatment with antibiotics will be needed. Foods Recommended Tolerance to foods usually improves over time. You may eat foods that you like and can tolerate. Food Group Foods Recommended in the First Few Weeks  Dairy Milk products as tolerated lactose-free or lactose-reduced products, sugar-free yogurt, sugar-free pudding, cheese, sugar-free ice cream  Protein Tender/soft meat, poultry, beans, eggs, smooth peanut butter, cheese, cottage cheese  Grains Crackers, pasta, plain breads and rolls, pretzels, rice, unsweetened cereals  Vegetables Cooked vegetables, vegetable juice  Fruits Soft fresh fruit, fruit canned in natural juice, unsweetened fruit juice  Desserts  Low-calorie gelatin, low-calorie popsicles, sugar-free desserts  Beverages Noncarbonated/sugar-free or low sugar beverages, water, diluted fruit juice  Oral supplements No added sugar Carnation Instant Breakfast, Glucerna, Boost Glucose Control, Ensure, Boost (other options also available)  Condiments Salt, pepper, mild-flavored sauces and gravies, other spices as tolerated, artificial sweeteners, low-calorie jelly     Food Group Foods That May Cause Distress in the First Few Weeks  Dairy Cocoa mixes, regular ice cream, chocolate milk, sweetened custard or pudding, regular yogurt, milkshakes  Protein Fried meats, lunch meats, bologna, salami, sausage, hot dogs, bacon, tough/stringy meats, nuts, chunky peanut butter  Vegetables Raw vegetables or fried vegetables Cooked vegetables including beets, broccoli, brussels sprouts, cabbage, mustard and turnip greens, cauliflower, corn, potato skins  Fruits Tough fresh fruits, dried fruits, canned or frozen fruits in syrup, sweetened juice  Sweets Sugar-coated cereals, doughnuts, sweet rolls, regular popsicles, gelatin, high-sugar desserts, cake, pie, sherbet  Beverages Carbonated beverages (even diet) due to gas formation, regular soft drinks, sugared drink mixes, sugar-containing fruit-flavored beverages, sweetened iced tea or similar drinks, alcohol, regular coffee  Condiments Sugar, jam, jelly  Whipple Surgery Sample 1-Day Menu View Nutrient Info  Breakfast 2 scrambled eggs Grated cheese, melted (on eggs) 1/2 cup oatmeal 1/2 cup applesauce  Morning Snack 8 ounces yogurt 1/2 cup sliced peaches  Lunch 1/2 cup blueberries 1/2 cup cottage  cheese (low-fat)  Afternoon Snack 2 tablespoons peanut butter 1 small banana  Evening Meal 3 ounces baked chicken 1/2 cup mashed potatoes w/ gravy Gravy, beef, canned, ready-to-serve 1/2 cup mixed vegetables w/ margarine Margarine Spread, approximately 48% fat, tub  Evening Snack 1 cup pudding 5 vanilla wafers   Daily Sum Nutrient Unit Value  Macronutrients  Energy kcal 1546  Energy kJ 6476  Protein g 84  Total lipid (fat) g 55  Carbohydrate, by difference g 188  Fiber, total dietary g 16  Sugars, total g 110  Minerals  Calcium, Ca mg 998  Iron, Fe mg 13  Sodium, Na mg 1975  Vitamins  Vitamin C, total ascorbic acid mg 33  Vitamin A, IU IU 5756  Vitamin D IU 94  Lipids  Fatty acids, total saturated g 16  Fatty acids, total monounsaturated g 24  Fatty acids, total polyunsaturated g 10  Cholesterol mg 419    Whipple Surgery Vegetarian (Lacto-Ovo) Sample 1-Day Menu View Nutrient Info  Breakfast 2 scrambled eggs   cup cheese 2 teaspoons olive oil  cup oatmeal 1 cup lactose-free milk  Morning Snack 6 ounces plain yogurt   cup applesauce  Lunch  cup low-fat cottage cheese   cup peaches  Afternoon Snack 1 banana  1 tablespoon smooth peanut butter  Evening Meal  cup tofu   cup mashed potatoes  cup cooked green beans 2 teaspoons olive oil  Evening Snack 1 cup nutrition supplement   Daily Sum Nutrient Unit Value  Macronutrients  Energy kcal 1601  Energy kJ 6693  Protein g 83  Total lipid (fat) g 67  Carbohydrate, by difference g 177  Fiber, total dietary g 14  Sugars, total g 95  Minerals  Calcium, Ca mg 1317  Iron, Fe mg 12  Sodium, Na mg 1164  Vitamins  Vitamin C, total ascorbic acid mg 85  Vitamin A, IU IU 3516  Vitamin D IU 390  Lipids  Fatty acids, total saturated g 16  Fatty acids, total monounsaturated g 30  Fatty acids, total polyunsaturated g 17  Cholesterol mg 376    Whipple Surgery Vegan Sample 1-Day Menu View Nutrient Info  Breakfast 1 slice whole wheat toast  1 tablespoon smooth almond butter  cup oatmeal  1 cup soymilk fortified with calcium, vitamin B12, and vitamin D  Morning Snack 6 ounces vanilla soy yogurt   cup applesauce  Lunch  cup kidney beans   cup brown rice 2 teaspoons olive oil  Afternoon Snack 1 banana  2 tablespoons  smooth peanut butter  Evening Meal  cup tofu   cup mashed potatoes  cup cooked green beans 2 teaspoons olive oil 1 dinner roll  Evening Snack 1 cup soy nutrition supplement   Daily Sum Nutrient Unit Value  Macronutrients  Energy kcal 1676  Energy kJ 7010  Protein g 79  Total lipid (fat) g 64  Carbohydrate, by difference g 209  Fiber, total dietary g 25  Sugars, total g 74  Minerals  Calcium, Ca mg 1158  Iron, Fe mg 15  Sodium, Na mg 803  Vitamins  Vitamin C, total ascorbic acid mg 78  Vitamin A, IU IU 1014  Vitamin D IU 119  Lipids  Fatty acids, total saturated g 10  Fatty acids, total monounsaturated g 31  Fatty acids, total polyunsaturated g 16  Cholesterol mg 1    Copyright 2020  Academy of Nutrition and    CCS      [facility], [place] [phone]  ABDOMINAL SURGERY: POST OP INSTRUCTIONS  Always review your discharge instruction sheet given to you by the facility where your surgery was performed.  IF YOU HAVE DISABILITY OR FAMILY LEAVE FORMS, YOU MUST BRING THEM TO THE OFFICE FOR PROCESSING.  PLEASE DO NOT GIVE THEM TO YOUR DOCTOR.  1. A prescription for pain medication may be given to you upon discharge.  Take your pain medication as prescribed, if needed.  If narcotic pain medicine is not needed, then you may take acetaminophen (Tylenol) or ibuprofen (Advil) as needed. 2. Take your usually prescribed medications unless otherwise directed. 3. If you need a refill on your pain medication, please contact your pharmacy. They will contact our office to request authorization.  Prescriptions will not be filled after 5pm or on week-ends. 4. You should follow a light diet the first few days after arrival home, such as soup and crackers, pudding, etc.unless your doctor has advised otherwise. A high-fiber, low fat diet can be resumed as tolerated.   Be sure to include lots of fluids daily. Most patients will experience some swelling and bruising on the chest and  neck area.  Ice packs will help.  Swelling and bruising can take several days to resolve 5. Most patients will experience some swelling and bruising in the area of the incision. Ice pack will help. Swelling and bruising can take several days to resolve..  6. It is common to experience some constipation if taking pain medication after surgery.  Increasing fluid intake and taking a stool softener will usually help or prevent this problem from occurring.  A mild laxative (Milk of Magnesia or Miralax) should be taken according to package directions if there are no bowel movements after 48 hours. 7.  You may have steri-strips (small skin tapes) in place directly over the incision.  These strips should be left on the skin for 10-14 days.  If your surgeon used skin glue on the incision, you may shower in 48 hours.  The glue will flake off over the next 2-3 weeks.  Any sutures or staples will be removed at the office during your follow-up visit. You may find that a light gauze bandage over your incision may keep your staples from being rubbed or pulled. You may shower and replace the bandage daily. 8. ACTIVITIES:  You may resume regular (light) daily activities beginning the next day--such as daily self-care, walking, climbing stairs--gradually increasing activities as tolerated.  You may have sexual intercourse when it is comfortable.  Refrain from any heavy lifting or straining until approved by your doctor. a. You may drive when you no longer are taking prescription pain medication, you can comfortably wear a seatbelt, and you can safely maneuver your car and apply brakes b. Return to Work: __________8 weeks if applicable_________________________ 14. You should see your doctor in the office for a follow-up appointment approximately two weeks after your surgery.  Make sure that you call for this appointment within a day or two after you arrive home to insure a convenient appointment time. OTHER INSTRUCTIONS:   _____________________________________________________________ _____________________________________________________________  WHEN TO CALL YOUR DOCTOR: 1. Fever over 101.0 2. Inability to urinate 3. Nausea and/or vomiting 4. Extreme swelling or bruising 5. Continued bleeding from incision. 6. Increased pain, redness, or drainage from the incision. 7. Difficulty swallowing or breathing 8. Muscle cramping or spasms. 9. Numbness or tingling in hands or feet or around lips.  The clinic staff is available to answer your questions during  regular business hours.  Please dont hesitate to call and ask to speak to one of the nurses if you have concerns.  For further questions, please visit [email or web address]

## 2019-03-10 NOTE — Progress Notes (Addendum)
Calorie Count Note  48 hour calorie count ordered.  Diet: soft Supplements: Ensure Enlive po TID, each supplement provides 350 kcal and 20 grams of protein  Day 1 Breakfast: 19 kcals, 0 grams protein Lunch: - Dinner: - Supplements: 1/2 Ensure (175 kcals, 10 grams protein)  Total intake: 194 kcal (9% of minimum estimated needs)  10 grams protein (9% of minimum estimated needs)  Spoke with pt at bedside, who was pleasant and in good spirits today. She reports decreased intake yesterday and today due to pain control and nausea. Pt reports she has been struggling since last night since they removed epidural. Observed breakfast tray- she consumed a few bites of tomato soup and a few sips of juice. Pt hopeful that nausea and pain medication adjustments will help her eat better. She also likes Ensure supplements and consumed a half one yesterday.   Pt with multiple diet related questions. RD provided "Whipple Surgery Nutrition Therapy" handout from the Academy of [employer]etics. Explained reasons for pt to follow a low fiber, low fat diet. Encouraged small frequent meals to help maximize overall intake. Discussed ways to increase calories and protein in diet and examples of lean proteins. Also encouraged avoiding concentrated sweets. Teach back method used. Pt verbalizes understanding of information provided. Expect good compliance.  Reviewed menu options with pt and assisted her lunch and dinner meal selections. Encouraged pt to order cold foods with less odors to assist with nausea. Pt very appreciate of RD visit.  Nutrition Dx: Increased nutrient needs related to acute illness(pancreatic cancer s/p Whipple procedure) as evidenced by estimated needs; ongoing  Goal: Patient will meet greater than or equal to 90% of their needs; progressing   Intervention:   -Continue calorie count -Continue Ensure Enlive po TID, each supplement provides 350 kcal and 20 grams of protein -Educated pt  on post-operative diet; provided "Whipple Surgery Nutrition Therapy" handout from [place]. Jimmye Norman, RD, LDN, [place] Registered Dietitian II Certified Diabetes Care and Education Specialist Pager: [phone] After hours Pager: [phone]

## 2019-03-10 NOTE — Progress Notes (Signed)
6 Days Post-Op   Subjective/Chief Complaint: Had quite a bit of pain after epidural pulled.  Had nausea/vomiting when pain was bad.    Objective: Vital signs in last 24 hours: Temp:  [98.3 F (36.8 C)-98.9 F (37.2 C)] 98.4 F (36.9 C) (09/30 1050) Pulse Rate:  [74-91] 86 (09/30 1050) Resp:  [18] 18 (09/30 1050) BP: (154-171)/(86-95) 156/95 (09/30 1050) SpO2:  [96 %-98 %] 98 % (09/30 1050) Last BM Date: 03/08/19  Intake/Output from previous day: 09/29 0701 - 09/30 0700 In: -  Out: 1430 [Urine:1200; Drains:230] Intake/Output this shift: No intake/output data recorded.  WDWN in NAD Lungs - breathing comfortably CV - RRR Abd - soft, non distended, approp tender.  No wound drainage or erythema.  drains - serosanguinous.   Lab Results:  Recent Labs    03/08/19 0224 03/09/19 0233  WBC 7.3 6.9  HGB 8.4* 8.5*  HCT 26.7* 26.9*  PLT 326 367   BMET Recent Labs    03/08/19 0224 03/09/19 0233  NA 140 138  K 3.7 3.5  CL 108 106  CO2 25 23  GLUCOSE 140* 112*  BUN 5* 6  CREATININE 0.85 0.82  CALCIUM 8.9 8.9   PT/INR No results for input(s): LABPROT, INR in the last 72 hours. ABG No results for input(s): PHART, HCO3 in the last 72 hours.  Invalid input(s): PCO2, PO2  Studies/Results: No results found.  Anti-infectives: Anti-infectives (From admission, onward)   Start     Dose/Rate Route Frequency Ordered Stop   03/04/19 1500  ceFAZolin (ANCEF) IVPB 2g/100 mL premix     2 g 200 mL/hr over 30 Minutes Intravenous Every 8 hours 03/04/19 1445 03/04/19 1614   03/04/19 0603  ceFAZolin (ANCEF) 2-4 GM/100ML-% IVPB    Note to Pharmacy: Barbie Haggis   : cabinet override      03/04/19 0603 03/04/19 0820   03/04/19 0600  ceFAZolin (ANCEF) IVPB 2g/100 mL premix     2 g 200 mL/hr over 30 Minutes Intravenous On call to O.R. 03/04/19 0555 03/04/19 1220      Assessment/Plan: s/p Procedure(s) with comments: LAPAROSCOPY DIAGNOSTIC (N/A) - GENERAL AND EPIDURAL WHIPPLE  PROCEDURE (N/A) - GENERAL AND EPIDURAL Nephrectomy Partial (Right)  Soft diet as tolerated. Epidural out.  Prn oxy, morphine, baclofen and BID neurontin.    Await voiding.   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  pT3N2    LOS: 6 days    Alyssa Ross 03/10/2019

## 2019-03-11 LAB — GLUCOSE, CAPILLARY
Glucose-Capillary: 146 mg/dL — ABNORMAL HIGH (ref 70–99)
Glucose-Capillary: 168 mg/dL — ABNORMAL HIGH (ref 70–99)
Glucose-Capillary: 175 mg/dL — ABNORMAL HIGH (ref 70–99)
Glucose-Capillary: 182 mg/dL — ABNORMAL HIGH (ref 70–99)
Glucose-Capillary: 209 mg/dL — ABNORMAL HIGH (ref 70–99)
Glucose-Capillary: 218 mg/dL — ABNORMAL HIGH (ref 70–99)

## 2019-03-11 MED ORDER — PRO-STAT SUGAR FREE PO LIQD
30.0000 mL | Freq: Two times a day (BID) | ORAL | Status: DC
  Administered 2019-03-11 – 2019-03-14 (×7): 30 mL via ORAL
  Filled 2019-03-11 (×7): qty 30

## 2019-03-11 MED ORDER — ADULT MULTIVITAMIN W/MINERALS CH
1.0000 | ORAL_TABLET | Freq: Every day | ORAL | Status: DC
  Administered 2019-03-11 – 2019-03-14 (×4): 1 via ORAL
  Filled 2019-03-11 (×5): qty 1

## 2019-03-11 NOTE — Progress Notes (Signed)
7 Days Post-Op   Subjective/Chief Complaint: Not eating very well.  Objective: Vital signs in last 24 hours: Temp:  [98.4 F (36.9 C)-99.5 F (37.5 C)] 98.9 F (37.2 C) (10/01 0319) Pulse Rate:  [80-107] 107 (10/01 0319) Resp:  [16-18] 17 (09/30 1951) BP: (156-179)/(89-109) 168/109 (10/01 0319) SpO2:  [95 %-98 %] 97 % (10/01 0319) Last BM Date: 03/11/19  Intake/Output from previous day: 09/30 0701 - 10/01 0700 In: 2004.2 [P.O.:100; I.V.:1904.2] Out: 1100 [Urine:1000; Drains:100] Intake/Output this shift: Total I/O In: -  Out: 1030 [Urine:900; Drains:130]  WDWN in NAD Lungs - breathing comfortably CV - RRR Abd - soft, non distended, approp tender.  No wound drainage or erythema.  drains - serosanguinous. Output down.  Lab Results:  Recent Labs    03/09/19 0233  WBC 6.9  HGB 8.5*  HCT 26.9*  PLT 367   BMET Recent Labs    03/09/19 0233  NA 138  K 3.5  CL 106  CO2 23  GLUCOSE 112*  BUN 6  CREATININE 0.82  CALCIUM 8.9   PT/INR No results for input(s): LABPROT, INR in the last 72 hours. ABG No results for input(s): PHART, HCO3 in the last 72 hours.  Invalid input(s): PCO2, PO2  Studies/Results: No results found.  Anti-infectives: Anti-infectives (From admission, onward)   Start     Dose/Rate Route Frequency Ordered Stop   03/04/19 1500  ceFAZolin (ANCEF) IVPB 2g/100 mL premix     2 g 200 mL/hr over 30 Minutes Intravenous Every 8 hours 03/04/19 1445 03/04/19 1614   03/04/19 0603  ceFAZolin (ANCEF) 2-4 GM/100ML-% IVPB    Note to Pharmacy: Barbie Haggis   : cabinet override      03/04/19 0603 03/04/19 0820   03/04/19 0600  ceFAZolin (ANCEF) IVPB 2g/100 mL premix     2 g 200 mL/hr over 30 Minutes Intravenous On call to O.R. 03/04/19 0555 03/04/19 1220      Assessment/Plan: s/p Procedure(s) with comments: LAPAROSCOPY DIAGNOSTIC (N/A) - GENERAL AND EPIDURAL WHIPPLE PROCEDURE (N/A) - GENERAL AND EPIDURAL Nephrectomy Partial (Right)   Soft diet  as tolerated. Epidural out.  Prn oxy, morphine, baclofen and BID neurontin.    Repeat labs tomorrow.   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

## 2019-03-11 NOTE — Progress Notes (Signed)
Sent Drain #1 fluid for lipase as per order.

## 2019-03-11 NOTE — Plan of Care (Signed)
  Problem: Education: Goal: Knowledge of General Education information will improve Description: Including pain rating scale, medication(s)/side effects and non-pharmacologic comfort measures Outcome: Progressing   Problem: Health Behavior/Discharge Planning: Goal: Ability to manage health-related needs will improve Outcome: Progressing   Problem: Activity: Goal: Risk for activity intolerance will decrease Outcome: Progressing   Problem: Nutrition: Goal: Adequate nutrition will be maintained Outcome: Progressing   Problem: Elimination: Goal: Will not experience complications related to urinary retention Outcome: Progressing   Problem: Pain Managment: Goal: General experience of comfort will improve Outcome: Progressing   Problem: Safety: Goal: Ability to remain free from injury will improve Outcome: Progressing   Problem: Skin Integrity: Goal: Risk for impaired skin integrity will decrease Outcome: Progressing

## 2019-03-11 NOTE — Progress Notes (Signed)
Calorie Count Note  48 hour calorie count ordered.  Diet: soft  Supplements: Ensure Enlive po TID, each supplement provides 350 kcal and 20 grams of protein  Per MD notes, pathology revealed adenocarcinoma of the pancreas instead of neuroendocrine tumor.   Spoke with pt at bedside, who reports feeling better today. She consumed 100% of cereal and milk at breakfast. She shares nausea and pain have improved. She think she will eat more today. She did not eat much after visit yesterday. She is only consuming sips of Ensure.   Day 1 Breakfast: 19 kcals, 0 grams protein Lunch: - Dinner: - Supplements: 1/2 Ensure (175 kcals, 10 grams protein)  Total intake: 194 kcal (9% of minimum estimated needs)  10 grams protein (9% of minimum estimated needs)  Day 2 Breakfast: 182 kcals, 6 grams protein Lunch: 35 kcals, 1 grams protein Dinner: none Supplements: A few sips of Ensure (35 kcals, 2 grams protein)  Total intake: 252 kcal (12% of minimum estimated needs)  9 grams protein (8% of minimum estimated needs)  Average Total intake: 223 kcal (11% of minimum estimated needs)  10 grams protein (9% of minimum estimated needs)  Nutrition Dx:  Increased nutrient needsrelated to acute illness(pancreatic cancer s/p Whipple procedure)as evidenced by estimated needs; ongoing  Goal: Patient will meet greater than or equal to 90% of their needs; progressing   Intervention:   -D/c calorie count -Continue Ensure Enlive po TID, each supplement provides 350 kcal and 20 grams of protein -30 ml Prostat BID, each supplement provides 100 kcals and 15 grams protein -MVI with minerals daily  Arsema Tusing A. Jimmye Norman, RD, LDN, [place] Registered Dietitian II Certified Diabetes Care and Education Specialist Pager: [phone] After hours Pager: [phone]

## 2019-03-11 NOTE — Progress Notes (Signed)
Both drains removed as ordered. Dry dressing applied. Pt tolerated well. Will continue to monitor.

## 2019-03-12 LAB — CBC
HCT: 30.4 % — ABNORMAL LOW (ref 36.0–46.0)
Hemoglobin: 9.9 g/dL — ABNORMAL LOW (ref 12.0–15.0)
MCH: 28.9 pg (ref 26.0–34.0)
MCHC: 32.6 g/dL (ref 30.0–36.0)
MCV: 88.9 fL (ref 80.0–100.0)
Platelets: 574 10*3/uL — ABNORMAL HIGH (ref 150–400)
RBC: 3.42 MIL/uL — ABNORMAL LOW (ref 3.87–5.11)
RDW: 13 % (ref 11.5–15.5)
WBC: 7.5 10*3/uL (ref 4.0–10.5)
nRBC: 0 % (ref 0.0–0.2)

## 2019-03-12 LAB — GLUCOSE, CAPILLARY
Glucose-Capillary: 169 mg/dL — ABNORMAL HIGH (ref 70–99)
Glucose-Capillary: 155 mg/dL — ABNORMAL HIGH (ref 70–99)
Glucose-Capillary: 233 mg/dL — ABNORMAL HIGH (ref 70–99)
Glucose-Capillary: 241 mg/dL — ABNORMAL HIGH (ref 70–99)
Glucose-Capillary: 225 mg/dL — ABNORMAL HIGH (ref 70–99)

## 2019-03-12 LAB — BASIC METABOLIC PANEL
Anion gap: 12 (ref 5–15)
BUN: 11 mg/dL (ref 6–20)
CO2: 27 mmol/L (ref 22–32)
Calcium: 8.9 mg/dL (ref 8.9–10.3)
Chloride: 98 mmol/L (ref 98–111)
Creatinine, Ser: 0.84 mg/dL (ref 0.44–1.00)
GFR calc Af Amer: >60 mL/min (ref >60)
GFR calc non Af Amer: >60 mL/min (ref >60)
Glucose, Bld: 143 mg/dL — ABNORMAL HIGH (ref 70–99)
Potassium: 3.3 mmol/L — ABNORMAL LOW (ref 3.5–5.1)
Sodium: 137 mmol/L (ref 135–145)

## 2019-03-12 MED ORDER — METOCLOPRAMIDE HCL 5 MG PO TABS: 5.0000 mg | ORAL_TABLET | Freq: Three times a day (TID) | ORAL | 0 refills | Status: DC

## 2019-03-12 MED ORDER — ENSURE ENLIVE PO LIQD: 237.0000 mL | Freq: Three times a day (TID) | ORAL | 12 refills | Status: DC

## 2019-03-12 MED ORDER — GABAPENTIN 100 MG PO CAPS: 100.0000 mg | ORAL_CAPSULE | Freq: Every day | ORAL | 1 refills | Status: DC

## 2019-03-12 MED ORDER — POTASSIUM CHLORIDE CRYS ER 20 MEQ PO TBCR
20.0000 meq | EXTENDED_RELEASE_TABLET | Freq: Two times a day (BID) | ORAL | Status: DC
  Administered 2019-03-12 – 2019-03-14 (×5): 20 meq via ORAL
  Filled 2019-03-12 (×5): qty 1

## 2019-03-12 MED ORDER — BACLOFEN 10 MG PO TABS: 10.0000 mg | ORAL_TABLET | Freq: Three times a day (TID) | ORAL | 0 refills | Status: DC | PRN

## 2019-03-12 MED ORDER — PANTOPRAZOLE SODIUM 40 MG PO TBEC: 40.0000 mg | DELAYED_RELEASE_TABLET | Freq: Every day | ORAL | 1 refills | Status: DC

## 2019-03-12 MED ORDER — TRAMADOL HCL 50 MG PO TABS: 50.0000 mg | ORAL_TABLET | Freq: Four times a day (QID) | ORAL | 0 refills | Status: DC | PRN

## 2019-03-12 MED ORDER — METOCLOPRAMIDE HCL 5 MG PO TABS
5.0000 mg | ORAL_TABLET | Freq: Three times a day (TID) | ORAL | Status: DC
  Administered 2019-03-12 – 2019-03-14 (×5): 5 mg via ORAL
  Filled 2019-03-12 (×5): qty 1

## 2019-03-12 MED ORDER — OXYCODONE HCL 5 MG PO TABS: 5.0000 mg | ORAL_TABLET | ORAL | 0 refills | Status: DC | PRN

## 2019-03-12 MED ORDER — PRO-STAT SUGAR FREE PO LIQD: 30.0000 mL | Freq: Two times a day (BID) | ORAL | 0 refills | Status: DC

## 2019-03-12 NOTE — Progress Notes (Signed)
Calorie Count Note  48 hour calorie count ordered.  Diet: soft Supplements: 30 ml Prostat BID, each supplement provides 100 kcals and 15 grams protein; Ensure Enlive po TID, each supplement provides 350 kcal and 20 grams of protein; MVI with minerals dailt  Another calorie count ordered per MD due to poor oral intake. Prostat supplement ordered yesterday, which pt is taking. Noted pt has accepted 3 Ensure supplements and 3 Prostat supplements within the past 24 hours. No meal completion data or meal completion data available currently for RD to assess.   Nutrition Dx: Increased nutrient needsrelated to acute illness(pancreatic cancer s/p Whipple procedure)as evidenced by estimated needs; ongoing  Goal: Patient will meet greater than or equal to 90% of their needs; progressing  Intervention:   -Initiate another 48 calorie count per MD; RD will follow-up on Monday, 03/15/19 for results -Continue Ensure Enlive po TID, each supplement provides 350 kcal and 20 grams of protein -Continue 30 ml Prostat BID, each supplement provides 100 kcals and 15 grams protein -Continue MVI with minerals daily  Fouad Taul A. Jimmye Norman, RD, LDN, [place] Registered Dietitian II Certified Diabetes Care and Education Specialist Pager: [phone] After hours Pager: [phone]

## 2019-03-12 NOTE — Progress Notes (Addendum)
8 Days Post-Op   Subjective/Chief Complaint: Calorie count orally was only about 12% of caloric needs.  Drains pulled yesterday.    Objective: Vital signs in last 24 hours: Temp:  [98.5 F (36.9 C)-99.5 F (37.5 C)] 99.5 F (37.5 C) (10/02 0426) Pulse Rate:  [93-107] 104 (10/02 0426) Resp:  [16-17] 16 (10/02 0426) BP: (137-153)/(94-99) 153/99 (10/02 0426) SpO2:  [94 %-98 %] 94 % (10/02 0426) Last BM Date: 03/11/19  Intake/Output from previous day: 10/01 0701 - 10/02 0700 In: 1235.8 [I.V.:1235.8] Out: 2075 [Urine:1800; Drains:275] Intake/Output this shift: No intake/output data recorded.  WDWN in NAD Lungs - breathing comfortably Abd - soft, non distended, approp tender.  No wound drainage or erythema.    Lab Results:  Recent Labs    03/12/19 0536  WBC 7.5  HGB 9.9*  HCT 30.4*  PLT 574*   BMET Recent Labs    03/12/19 0536  NA 137  K 3.3*  CL 98  CO2 27  GLUCOSE 143*  BUN 11  CREATININE 0.84  CALCIUM 8.9   PT/INR No results for input(s): LABPROT, INR in the last 72 hours. ABG No results for input(s): PHART, HCO3 in the last 72 hours.  Invalid input(s): PCO2, PO2  Studies/Results: No results found.  Anti-infectives: Anti-infectives (From admission, onward)   Start     Dose/Rate Route Frequency Ordered Stop   03/04/19 1500  ceFAZolin (ANCEF) IVPB 2g/100 mL premix     2 g 200 mL/hr over 30 Minutes Intravenous Every 8 hours 03/04/19 1445 03/04/19 1614   03/04/19 0603  ceFAZolin (ANCEF) 2-4 GM/100ML-% IVPB    Note to Pharmacy: Barbie Haggis   : cabinet override      03/04/19 0603 03/04/19 0820   03/04/19 0600  ceFAZolin (ANCEF) IVPB 2g/100 mL premix     2 g 200 mL/hr over 30 Minutes Intravenous On call to O.R. 03/04/19 0555 03/04/19 1220      Assessment/Plan: s/p Procedure(s) with comments: LAPAROSCOPY DIAGNOSTIC (N/A) - GENERAL AND EPIDURAL WHIPPLE PROCEDURE (N/A) - GENERAL AND EPIDURAL Nephrectomy Partial (Right)   Soft diet as  tolerated. Epidural out.  Prn oxy, morphine, baclofen and BID neurontin.    Repeat labs tomorrow look ok except for potassium.   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  pT3N2 D/c hopefully in next few days if caloric intake improves.     LOS: 8 days    Stark Klein 03/12/2019

## 2019-03-13 LAB — GLUCOSE, CAPILLARY
Glucose-Capillary: 146 mg/dL — ABNORMAL HIGH (ref 70–99)
Glucose-Capillary: 164 mg/dL — ABNORMAL HIGH (ref 70–99)
Glucose-Capillary: 201 mg/dL — ABNORMAL HIGH (ref 70–99)
Glucose-Capillary: 210 mg/dL — ABNORMAL HIGH (ref 70–99)
Glucose-Capillary: 225 mg/dL — ABNORMAL HIGH (ref 70–99)

## 2019-03-13 LAB — CBC
HCT: 28.7 % — ABNORMAL LOW (ref 36.0–46.0)
Hemoglobin: 9.6 g/dL — ABNORMAL LOW (ref 12.0–15.0)
MCH: 29.1 pg (ref 26.0–34.0)
MCHC: 33.4 g/dL (ref 30.0–36.0)
MCV: 87 fL (ref 80.0–100.0)
Platelets: 568 10*3/uL — ABNORMAL HIGH (ref 150–400)
RBC: 3.3 MIL/uL — ABNORMAL LOW (ref 3.87–5.11)
RDW: 12.8 % (ref 11.5–15.5)
WBC: 7.5 10*3/uL (ref 4.0–10.5)
nRBC: 0 % (ref 0.0–0.2)

## 2019-03-13 LAB — BASIC METABOLIC PANEL
Anion gap: 9 (ref 5–15)
BUN: 13 mg/dL (ref 6–20)
CO2: 26 mmol/L (ref 22–32)
Calcium: 8.8 mg/dL — ABNORMAL LOW (ref 8.9–10.3)
Chloride: 98 mmol/L (ref 98–111)
Creatinine, Ser: 0.81 mg/dL (ref 0.44–1.00)
GFR calc Af Amer: >60 mL/min (ref >60)
GFR calc non Af Amer: >60 mL/min (ref >60)
Glucose, Bld: 167 mg/dL — ABNORMAL HIGH (ref 70–99)
Potassium: 3.5 mmol/L (ref 3.5–5.1)
Sodium: 133 mmol/L — ABNORMAL LOW (ref 135–145)

## 2019-03-13 MED ORDER — ALUM & MAG HYDROXIDE-SIMETH 200-200-20 MG/5ML PO SUSP
30.0000 mL | Freq: Four times a day (QID) | ORAL | Status: DC | PRN
  Filled 2019-03-13: qty 30

## 2019-03-13 MED ORDER — ENOXAPARIN SODIUM 40 MG/0.4ML ~~LOC~~ SOLN
40.0000 mg | SUBCUTANEOUS | Status: DC
  Administered 2019-03-13 – 2019-03-14 (×2): 40 mg via SUBCUTANEOUS
  Filled 2019-03-13 (×2): qty 0.4

## 2019-03-13 NOTE — Progress Notes (Signed)
9 Days Post-Op  Subjective: CC: Calorie count underway. Patient reports she drank 2 ensures and ate < 50% of her meals yesterday. She is without any abdominal pain, N/V. She is having flatus and BM's. Reports her intake is low as she does not like the food, she is having her sister bring some things from home. She feels like the food just sits in her stomach and she has a reflux.   Objective: Vital signs in last 24 hours: Temp:  [98.2 F (36.8 C)-99 F (37.2 C)] 99 F (37.2 C) (10/03 0415) Pulse Rate:  [102-108] 102 (10/03 0415) Resp:  [18] 18 (10/03 0415) BP: (135-143)/(88-92) 143/88 (10/03 0415) SpO2:  [96 %-98 %] 97 % (10/03 0415) Last BM Date: 03/11/19  Intake/Output from previous day: 10/02 0701 - 10/03 0700 In: 2832.3 [P.O.:1119; I.V.:1713.3] Out: 890 [Urine:890] Intake/Output this shift: No intake/output data recorded.  PE: Gen: Awake and alert, NAD Lungs: Normal rate and effort Abd: Soft, ND, NT, midline and laparoscopic incisions with staples intact that are c/d/i without drainage, bleeding, or signs of infection. Drain site c/d/i  Lab Results:  Recent Labs    03/12/19 0536 03/13/19 0308  WBC 7.5 7.5  HGB 9.9* 9.6*  HCT 30.4* 28.7*  PLT 574* 568*   BMET Recent Labs    03/12/19 0536 03/13/19 0308  NA 137 133*  K 3.3* 3.5  CL 98 98  CO2 27 26  GLUCOSE 143* 167*  BUN 11 13  CREATININE 0.84 0.81  CALCIUM 8.9 8.8*   PT/INR No results for input(s): LABPROT, INR in the last 72 hours. CMP     Component Value Date/Time   NA 133 (L) 03/13/2019 0308   NA 134 12/01/2018 1226   NA 141 07/15/2014 1048   K 3.5 03/13/2019 0308   K 3.9 07/15/2014 1048   CL 98 03/13/2019 0308   CO2 26 03/13/2019 0308   CO2 27 07/15/2014 1048   GLUCOSE 167 (H) 03/13/2019 0308   GLUCOSE 169 (H) 07/15/2014 1048   BUN 13 03/13/2019 0308   BUN 18 12/01/2018 1226   BUN 16.6 07/15/2014 1048   CREATININE 0.81 03/13/2019 0308   CREATININE 0.9 07/15/2014 1048   CALCIUM 8.8  (L) 03/13/2019 0308   CALCIUM 9.2 07/15/2014 1048   PROT 5.8 (L) 03/09/2019 0233   PROT 7.5 12/08/2018 1410   PROT 7.4 07/15/2014 1048   ALBUMIN 3.0 (L) 03/09/2019 0233   ALBUMIN 4.4 12/08/2018 1410   ALBUMIN 3.7 07/15/2014 1048   AST 13 (L) 03/09/2019 0233   AST 16 07/15/2014 1048   ALT 15 03/09/2019 0233   ALT 15 07/15/2014 1048   ALKPHOS 42 03/09/2019 0233   ALKPHOS 62 07/15/2014 1048   BILITOT 0.8 03/09/2019 0233   BILITOT 9.9 (H) 12/08/2018 1410   BILITOT 0.42 07/15/2014 1048   GFRNONAA >60 03/13/2019 0308   GFRAA >60 03/13/2019 0308   Lipase     Component Value Date/Time   LIPASE 1,370 (H) 12/08/2018 1410       Studies/Results: No results found.  Anti-infectives: Anti-infectives (From admission, onward)   Start     Dose/Rate Route Frequency Ordered Stop   03/04/19 1500  ceFAZolin (ANCEF) IVPB 2g/100 mL premix     2 g 200 mL/hr over 30 Minutes Intravenous Every 8 hours 03/04/19 1445 03/04/19 1614   03/04/19 0603  ceFAZolin (ANCEF) 2-4 GM/100ML-% IVPB    Note to Pharmacy: Barbie Haggis   : cabinet override  03/04/19 0603 03/04/19 0820   03/04/19 0600  ceFAZolin (ANCEF) IVPB 2g/100 mL premix     2 g 200 mL/hr over 30 Minutes Intravenous On call to O.R. 03/04/19 0555 03/04/19 1220       Assessment/Plan S/p LAPAROSCOPY DIAGNOSTIC (N/A) - GENERAL AND EPIDURAL WHIPPLE PROCEDURE (N/A) - GENERAL AND EPIDURAL Nephrectomy Partial (Right) - Dr. 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  pT3N2 - Scheduled Reglan for motility  - Add Maalox, continue protonix for indigestion - Continue cal count - Hopefully home in the next few days if able to intake more  FEN - Soft VTE - SCD, Start Lovenox (hgb stable),  ID - None currently    LOS: 9 days    Jillyn Ledger , [facility] 03/13/2019, 9:18 AM Pager: [phone]

## 2019-03-13 NOTE — Plan of Care (Signed)
  Problem: Activity: Goal: Risk for activity intolerance will decrease Outcome: Progressing   Problem: Elimination: Goal: Will not experience complications related to bowel motility Outcome: Progressing   Problem: Pain Managment: Goal: General experience of comfort will improve Outcome: Progressing   

## 2019-03-14 LAB — CBC
HCT: 28.5 % — ABNORMAL LOW (ref 36.0–46.0)
Hemoglobin: 9.6 g/dL — ABNORMAL LOW (ref 12.0–15.0)
MCH: 29.4 pg (ref 26.0–34.0)
MCHC: 33.7 g/dL (ref 30.0–36.0)
MCV: 87.4 fL (ref 80.0–100.0)
Platelets: 605 10*3/uL — ABNORMAL HIGH (ref 150–400)
RBC: 3.26 MIL/uL — ABNORMAL LOW (ref 3.87–5.11)
RDW: 13 % (ref 11.5–15.5)
WBC: 7.3 10*3/uL (ref 4.0–10.5)
nRBC: 0 % (ref 0.0–0.2)

## 2019-03-14 LAB — BASIC METABOLIC PANEL
Anion gap: 12 (ref 5–15)
BUN: 12 mg/dL (ref 6–20)
CO2: 26 mmol/L (ref 22–32)
Calcium: 8.9 mg/dL (ref 8.9–10.3)
Chloride: 97 mmol/L — ABNORMAL LOW (ref 98–111)
Creatinine, Ser: 0.84 mg/dL (ref 0.44–1.00)
GFR calc Af Amer: >60 mL/min (ref >60)
GFR calc non Af Amer: >60 mL/min (ref >60)
Glucose, Bld: 162 mg/dL — ABNORMAL HIGH (ref 70–99)
Potassium: 3.6 mmol/L (ref 3.5–5.1)
Sodium: 135 mmol/L (ref 135–145)

## 2019-03-14 LAB — GLUCOSE, CAPILLARY
Glucose-Capillary: 145 mg/dL — ABNORMAL HIGH (ref 70–99)
Glucose-Capillary: 147 mg/dL — ABNORMAL HIGH (ref 70–99)
Glucose-Capillary: 231 mg/dL — ABNORMAL HIGH (ref 70–99)

## 2019-03-14 LAB — LIPASE, FLUID: Lipase-Fluid: 18 U/L

## 2019-03-15 LAB — TYPE AND SCREEN
ABO/RH(D): AB POS
Antibody Screen: NEGATIVE
Unit division: 0
Unit division: 0
Unit division: 0
Unit division: 0

## 2019-03-15 LAB — BPAM RBC
Blood Product Expiration Date: 202010092359
Blood Product Expiration Date: 202010152359
Blood Product Expiration Date: 202010212359
Blood Product Expiration Date: 202010212359
Unit Type and Rh: 6200
Unit Type and Rh: 6200
Unit Type and Rh: 8400
Unit Type and Rh: 8400

## 2019-03-15 LAB — GLUCOSE, CAPILLARY: Glucose-Capillary: 194 mg/dL — ABNORMAL HIGH (ref 70–99)

## 2019-03-18 NOTE — Discharge Summary (Signed)
Patient ID: Alyssa Ross [id number] April 11, 1966 53 y.o.  Admit date: 03/04/2019 Discharge date: 03/14/2019  Admitting Diagnosis: Primary malignant neuroendocrine tumor of the duodenum, right superior pole renal mass  Discharge Diagnosis Patient Active Problem List   Diagnosis Date Noted  . Pancreatic cancer ([place]) 03/04/2019  . Primary malignant neuroendocrine neoplasm of pancreas ([place]) 03/04/2019  . Genetic testing 02/23/2019  . 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 Family history of pancreatic cancer   . Essential hypertension, benign 12/22/2012  . Pure hypercholesterolemia 12/22/2012  . Type II or unspecified type diabetes mellitus without mention of complication, uncontrolled 12/17/2012    Consultants Urology - Dr. Tresa Moore   Reason for Admission: The patient is a 53 year old female who presents with pancreatic cancer. Pt is a 53 yo F who is referred for consultation by Dr. Benson Norway for new diagnosis of malignant mass of pancreatic head 12/2018. She presented to PCP with fatigue 11/2018. She was found to have a UTI and was treated. Very shortly thereafter, she became jaundiced and developed pruritus. She lost around 10 pounds during this process due to lack of appetite. She was seen to have dilated ducts on u/s. MRI confirmed this and showed a 2.5 cm pancreatic head mass. She was referred to Dr. Benson Norway and underwent EUS/ERCP. The mass was worrisome for adenoca on u/s appearance and there were two lymph nodes seen. 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 She has been able to regain some of the weight back. She has not had diarrhea. She is diabetic, but this is not a new dx. She also has a first cousin who died of pancreatic cancer and a different first cousin (same side of family) who died of metastatic cancer of unknown.  primary.  Procedures 1. Dr. Barry Dienes - 03/04/19  -Diagnostic laparoscopy  -Classic pancreaticoduodenectomy   -Placement of pancreatic duct stent   2. Dr. Alyson Ingles - 03/04/19 - Open Right radical partial nephrectomy  Hospital Course:  [name] was admitted for primary malignant neuroendocrine tumor of the duodenum, right superior pole renal mass and taken to the OR by Dr. Barry Dienes and Dr. Alyson Ingles as above.  Patient tolerated the procedure well and was transferred to the floor on PCA and Epidural, with NGT and Foley. NGT was clamped on POD2 and removed on POD 3. Patients diet was advanced and tolerated. Epidural and Foley were d/c on POD 5. Patient underwent calorie count to ensure see was eating enough prior to d/c. Pathology came back as Adenocarcinoma of the pancreas instead of neuroendocrine tumor, pT3N2. Patient is to schedule follow up with Dr. Barry Dienes and Urology arranged follow up per notes with Dr. Alyson Ingles at [facility]. On 03/14/2019, POD 10, patient was tolerating a diet, pain well controlled, vss, ambulating well, incisions c/d/i and felt to be discharged home.  Above was taken from chart review. I was not present at the time of discharge.   Physical Exam: I was not present or examined the patient in the time of discharge.  Please defer to MDs documentation  Allergies as of 03/14/2019      Reactions   Cherry Rash   Lemon Oil Rash      Medication List    TAKE these medications   acetaminophen 325 MG tablet Commonly known as: TYLENOL Take 650 mg by mouth every 6 (six) hours as needed for moderate pain or headache.   baclofen 10 MG tablet Commonly known as: LIORESAL Take 1 tablet (10 mg total)  by mouth 3 (three) times daily as needed for muscle spasms.   feeding supplement (ENSURE ENLIVE) Liqd Take 237 mLs by mouth 3 (three) times daily between meals. Notes to patient: This afternoon at 2:00 PM   feeding supplement (PRO-STAT SUGAR FREE 64) Liqd Take 30 mLs by mouth 2 (two) times daily. Notes to patient: Tonight at 10:00 PM   FreeStyle Libre 14 Day Reader Kerrin Mo USE READER TO  CHECK BLOOD GLUCOSE WITH FREESTYLE LIBRE SENSORS. Notes to patient: Resume to your regular schedule   FreeStyle Libre 14 Day Sensor Misc APPLY SENSOR TO BODY ONCE EVERY 14 DAYS TO MONITOR BLOOD GLUCOSE   gabapentin 100 MG capsule Commonly known as: NEURONTIN Take 1 capsule (100 mg total) by mouth at bedtime. Notes to patient: Tonight at 10:00 PM   ibuprofen 200 MG tablet Commonly known as: ADVIL Take 400-600 mg by mouth every 6 (six) hours as needed for headache or moderate pain.   insulin lispro 100 UNIT/ML KwikPen Commonly known as: HumaLOG KwikPen Inject 20 units under the skin three times daily before meals. What changed:   how much to take  how to take this  when to take this  reasons to take this  additional instructions Notes to patient: Resume to your regular schedule   Insulin Pen Needle 32G X 4 MM Misc Commonly known as: NovoFine Plus Use 3 per day to inject insulin Notes to patient: Resume to your regular schedule   lisinopril-hydrochlorothiazide 10-12.5 MG tablet Commonly known as: ZESTORETIC Take 0.5 tablets by mouth every evening. Notes to patient: Resume to your regular schedule   loratadine 10 MG tablet Commonly known as: CLARITIN Take 10 mg by mouth every evening. Notes to patient: Resume to your regular schedule   metFORMIN 750 MG 24 hr tablet Commonly known as: GLUCOPHAGE-XR Take 2,250 mg by mouth daily with supper.   metoCLOPramide 5 MG tablet Commonly known as: REGLAN Take 1 tablet (5 mg total) by mouth 3 (three) times daily before meals. Notes to patient: This afternoon at 12:00 PM   oxyCODONE 5 MG immediate release tablet Commonly known as: Oxy IR/ROXICODONE Take 1-2 tablets (5-10 mg total) by mouth every 4 (four) hours as needed for moderate pain, severe pain or breakthrough pain.   pantoprazole 40 MG tablet Commonly known as: PROTONIX Take 1 tablet (40 mg total) by mouth daily at 12 noon. Notes to patient: Today at noon    potassium chloride SA 20 MEQ tablet Commonly known as: KLOR-CON TAKE 1 TABLET DAILY What changed: when to take this Notes to patient: Tomorrow at 10:00 AM   traMADol 50 MG tablet Commonly known as: ULTRAM Take 1-2 tablets (50-100 mg total) by mouth every 6 (six) hours as needed for moderate pain or severe pain.   TRESIBA FLEXTOUCH  Inject 15-60 Units into the skin at bedtime. Notes to patient: Resume to your regular schedule            Discharge Care Instructions  (From admission, onward)         Start     Ordered   03/14/19 0000  Discharge wound care:    Comments: Staple removal in the [place] office   03/14/19 0819   03/14/19 0000  Discharge wound care:    Comments: Staple removal in [place] office   03/14/19 0821           Follow-up Information    Stark Klein, MD Follow up in 2 week(s).   Specialty: General Surgery Contact information:  [address] [place] [address] [phone]           Signed: Alferd Apa, [facility] 03/18/2019, 4:16 PM Pager: [phone]

## 2019-03-22 ENCOUNTER — Other Ambulatory Visit: Payer: Self-pay | Admitting: General Surgery

## 2019-03-22 ENCOUNTER — Telehealth: Payer: Self-pay | Admitting: Oncology

## 2019-03-22 NOTE — Telephone Encounter (Signed)
Spoke with patient re 03/30/19 new patient appointment with Dr. Benay Spice.

## 2019-03-24 NOTE — Progress Notes (Signed)
GI Location of Tumor / Histology: Adenocarcinoma of head of pancreas  Alyssa Ross presented to he PCP with fatigue 11/2018.  She was found to have a UTI and was treated accordingly.  Shortly after she became jaundiced and developed pruritus.  She lost around 10 pounds during this process due to lack of appetite.  PET 03/03/2019: Within the right upper lobe rounded nodule measures 9 mm.  Within the superior segment of the left lower lobe small cavitary nodule measures 8 mm.  These 2 nodules are present on comparison CT 01/29/2019.   No focal activity within the pancreas to localize well differentiated neuroendocrine tumor.  CT CAP 01/29/2019: Very ill defined tumor posteriorly along the pancreatic head, with similar enhancement characteristics to the rest of the pancreatic parenchyma which makes determining margins of the lesion problematic.  A very faint hypo-enhancing region measures approximately 2.2 x 1.5 cm along the posteromedial margin of the stent.  A total of 5 pulmonary nodules are identified measuring up to 0.9 cm in diameter. One of these has a small amount of central cavitation as detailed above. Metastatic lesions are a distinct possibility.  Complex cystic lesion of the right kidney upper pole measuring up to 4.1 cm, indeterminate for malignancy/renal cell carcinoma, representing a Bosniak category 3 cystic lesion.  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  Endoscopic stenting was performed of the common bile duct.   MRI Abdomen 12/18/2018:  Along the posterior margin of the pancreatic head there is an area of T1 signal hypointensity measuring 2.5 x 1.8 cm.  Pancreatic duct is dilated measuring up to 9 mm and demonstrates mild diffuse irregularity.  Suspected lesion does not involve the celiac artery, hepatic artery, superior mesenteric artery. No definite evidence of metastatic disease within the abdomen.  Biopsies of Whipple procedure with gallbladder 03/04/2019    Past/Anticipated interventions by surgeon, if any:  Dr. Barry Dienes 03/22/2019 -Laparoscopy and whipple 03/04/2019. -4/34 LN positive, tumor was 5.5 cm.  SMV groove was positive.  Additionally she had some indeterminate pulmonary lesions on her chest CT. -diagnosis of adenocarcinoma with 4/34 nodes positive.  Will need chemo?.  Radiation with SMV positive margin. -Will refer to oncology and radiation oncology.   -Will plan port placement.  Past/Anticipated interventions by medical oncology, if any:  Dr. Benay Spice 03/30/2019   Weight changes, if any: Lost about 20 pounds since surgery, more stable now.  Bowel/Bladder complaints, if any: regular, no constipation, has occasional diarrhea.  Nausea / Vomiting, if any: Has some vomiting depending on what she eats.  Pain issues, if any:  No     SAFETY ISSUES:  Prior radiation? No  Pacemaker/ICD? No  Possible current pregnancy? Perimenopausal  Is the patient on methotrexate? No  Current Complaints/Details: Genetic Testing: Negative

## 2019-03-25 ENCOUNTER — Other Ambulatory Visit: Payer: Self-pay | Admitting: Endocrinology

## 2019-03-25 ENCOUNTER — Ambulatory Visit
Admission: RE | Admit: 2019-03-25 | Discharge: 2019-03-25 | Disposition: A | Discharge status: Home / Self Care | Payer: BC Managed Care – PPO | Source: Ambulatory Visit | Attending: Radiation Oncology | Admitting: Radiation Oncology

## 2019-03-25 ENCOUNTER — Other Ambulatory Visit: Payer: Self-pay

## 2019-03-25 ENCOUNTER — Encounter: Payer: Self-pay | Admitting: Radiation Oncology

## 2019-03-25 VITALS — Ht 68.5 in | Wt 200.0 lb

## 2019-03-25 DIAGNOSIS — C250 Malignant neoplasm of head of pancreas: Secondary | ICD-10-CM

## 2019-03-25 DIAGNOSIS — Z808 Family history of malignant neoplasm of other organs or systems: Secondary | ICD-10-CM | POA: Diagnosis not present

## 2019-03-25 DIAGNOSIS — Z9889 Other specified postprocedural states: Secondary | ICD-10-CM | POA: Diagnosis not present

## 2019-03-25 DIAGNOSIS — C779 Secondary and unspecified malignant neoplasm of lymph node, unspecified: Secondary | ICD-10-CM | POA: Diagnosis not present

## 2019-03-26 NOTE — Progress Notes (Signed)
Radiation Oncology         [phone] ________________________________  Initial Outpatient Consultation - Conducted via telephone due to current COVID-19 concerns for limiting patient exposure  I spoke with the patient to conduct this consult visit via telephone to spare the patient unnecessary potential exposure in the healthcare setting during the current COVID-19 pandemic. The patient was notified in advance and was offered a [place] meeting to allow for face to face communication but unfortunately reported that they did not have the appropriate resources/technology to support such a visit and instead preferred to proceed with a telephone consult.   ________________________________  Name: Alyssa Ross        MRN: [id number]  Date of Service: 03/25/2019 DOB: 11-27-1965  NL:[id number], Bailey Mech, MD  Stark Klein, MD     REFERRING PHYSICIAN: Stark Klein, MD   DIAGNOSIS: The encounter diagnosis was Malignant neoplasm of head of pancreas ([place]).   HISTORY OF PRESENT ILLNESS: Alyssa Ross is a 53 y.o. female seen at the request of Dr. Barry Dienes for a newly diagnosed pancreatic adenocarcinoma. The patient had been seen by her PCP in June 2020 due to fatigue. She was diagnosed with a UTI but shortly thereafter she developed jaundice and generalized pruritis. She was found to have an abdominal ultrasound on 12/10/2018 that revealed intra and extrahepatic ductal dilatation as well as pancreatic ductal dilatation. She proceeded with MRI of the abdomen on 12/18/2018 revealing a 2.5 x 1.8 cm mass in the head of the pancreas with pancreatic ductal dilitation. She also underwent ERCP with placement of a stent in the distal common bile duct. She was initially thought to have a neuroendocrine tumor, and a PET with dotatate protocol on 03/03/2019 revealed no focal uptake within the pancreas evidence of disease outside of the pancreas, and a pulmonary nodule that had been seen on previous CT did not show activity.  She was  taken to the operating room on 03/04/2019, a Whipple procedure was performed,final pathology revealed a 5.5 cm ductal adenocarcinoma of the pancreas involving the duodenum and superior mesenteric groove no this was not technically invading the SMA but was felt to be very focally close to the margin.  She had 34 sampled lymph nodes for contained metastatic disease.  She is seen today via my chart to discuss options of treatment for her cancer.  PREVIOUS RADIATION THERAPY: No   PAST MEDICAL HISTORY:  Past Medical History:  Diagnosis Date   ASCUS (atypical squamous cells of undetermined significance) on Pap smear 08/06/1999   Breast mass in female 2002   Left   Cancer [facility])    Neuroendocrine Tumor of the pancreas   Diabetes mellitus without complication ([place])    Family history of pancreatic cancer    Fibroid uterus 2010   Hypertension    Irregular bleeding 2011   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   PONV (postoperative nausea and vomiting)    nausea  vomitting after 12/25/18 ERCP   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   Yeast vaginitis 2006       PAST SURGICAL HISTORY: Past Surgical History:  Procedure Laterality Date   BILIARY STENT PLACEMENT N/A 12/25/2018   Procedure: BILIARY STENT PLACEMENT;  Surgeon: Carol Ada, MD;  Location: [facility];  Service: Endoscopy;  Laterality: N/A;   BILIARY STENT PLACEMENT N/A 01/01/2019   Procedure: BILIARY STENT PLACEMENT;  Surgeon: Carol Ada, MD;  Location: [facility];  Service: Endoscopy;  Laterality: N/A;   DILATATION & CURETTAGE/HYSTEROSCOPY WITH TRUECLEAR N/A  01/20/2014   Procedure: DILATATION & CURETTAGE/HYSTEROSCOPY WITH TRUCLEAR;  Surgeon: [name], MD;  Location: [facility];  Service: Gynecology;  Laterality: N/A;   DILATATION & CURRETTAGE/HYSTEROSCOPY WITH RESECTOCOPE N/A 01/20/2014   Procedure: DILATATION & CURETTAGE/HYSTEROSCOPY WITH RESECTOCOPE;  Surgeon: [name],  MD;  Location: [facility];  Service: Gynecology;  Laterality: N/A;   ENDOSCOPIC RETROGRADE CHOLANGIOPANCREATOGRAPHY (ERCP) WITH PROPOFOL N/A 12/25/2018   Procedure: ENDOSCOPIC RETROGRADE CHOLANGIOPANCREATOGRAPHY (ERCP) WITH PROPOFOL;  Surgeon: Carol Ada, MD;  [facility]: [facility];  Service: Endoscopy;  Laterality: N/A;   ENDOSCOPIC RETROGRADE CHOLANGIOPANCREATOGRAPHY (ERCP) WITH PROPOFOL N/A 01/01/2019   Procedure: ENDOSCOPIC RETROGRADE CHOLANGIOPANCREATOGRAPHY (ERCP) WITH PROPOFOL;  Surgeon: Carol Ada, MD;  [facility]: [facility];  Service: Endoscopy;  Laterality: N/A;   ERCP  12/25/2018   FINE NEEDLE ASPIRATION N/A 01/01/2019   Procedure: FINE NEEDLE ASPIRATION (FNA) LINEAR;  Surgeon: Carol Ada, MD;  [facility]: [facility];  Service: Endoscopy;  Laterality: N/A;   LAPAROSCOPY N/A 03/04/2019   Procedure: LAPAROSCOPY DIAGNOSTIC;  Surgeon: Stark Klein, MD;  Location: [facility];  Service: General;  Laterality: N/A;  GENERAL AND EPIDURAL   NO PAST SURGERIES     PARTIAL NEPHRECTOMY Right 03/04/2019   Procedure: Nephrectomy Partial;  Surgeon: Cleon Gustin, MD;  Location: [facility];  Service: Urology;  Laterality: Right;   SPHINCTEROTOMY  12/25/2018   Procedure: SPHINCTEROTOMY;  Surgeon: Carol Ada, MD;  Location: [facility];  Service: Endoscopy;;   STENT REMOVAL  01/01/2019   Procedure: STENT REMOVAL;  Surgeon: Carol Ada, MD;  Location: [facility];  Service: Endoscopy;;   UPPER ESOPHAGEAL ENDOSCOPIC ULTRASOUND (EUS) N/A 01/01/2019   Procedure: UPPER ESOPHAGEAL ENDOSCOPIC ULTRASOUND (EUS);  Surgeon: Carol Ada, MD;  Location: [facility];  Service: Endoscopy;  Laterality: N/A;   WHIPPLE PROCEDURE N/A 03/04/2019   Procedure: WHIPPLE PROCEDURE;  Surgeon: Stark Klein, MD;  Location: [place];  Service: General;  Laterality: N/A;  GENERAL AND EPIDURAL     FAMILY HISTORY:  Family History  Problem Relation Age of Onset   Diabetes Mother    Dementia Mother     Hyperlipidemia Mother    Hypertension Mother    Irregular heart beat Mother    Diabetes Father    Hypertension Father    Hyperlipidemia Father    Down syndrome Sister    Diabetes Sister    Hyperlipidemia Brother    Heart Problems Brother    Goiter Maternal Aunt    Thyroid nodules Sister    Cancer Cousin 11       eye; maternal first cousin   Goiter Cousin    Cancer Paternal Aunt        unknown form of cancer   Cancer Cousin        unknown form of cancer; paternal first cousin   Pancreatic cancer Cousin 86       paternal first cousin   Cancer Cousin 69       unknown cancer; paternal first cousin   Cancer Cousin 96       unknown cancer; paternal first cousin     SOCIAL HISTORY:  reports that she has never smoked. She has never used smokeless tobacco. She reports current alcohol use. She reports that she does not use drugs.   ALLERGIES: Cherry and Lemon oil   MEDICATIONS:  Current Outpatient Medications  Medication Sig Dispense Refill   acetaminophen (TYLENOL) 325 MG tablet Take 650 mg by mouth every 6 (six) hours as needed for moderate pain or headache.  Amino Acids-Protein Hydrolys (FEEDING SUPPLEMENT, PRO-STAT SUGAR FREE 64,) LIQD Take 30 mLs by mouth 2 (two) times daily. 887 mL 0   baclofen (LIORESAL) 10 MG tablet Take 1 tablet (10 mg total) by mouth 3 (three) times daily as needed for muscle spasms. 30 each 0   Continuous Blood Gluc Receiver (FREESTYLE LIBRE 14 DAY READER) DEVI USE READER TO CHECK BLOOD GLUCOSE WITH FREESTYLE LIBRE SENSORS. 1 Device 0   Continuous Blood Gluc Sensor (FREESTYLE LIBRE 14 DAY SENSOR) MISC APPLY SENSOR TO BODY ONCE EVERY 14 DAYS TO MONITOR BLOOD GLUCOSE 4 each 3   Dulaglutide 0.75 MG/0.5ML SOPN Inject into the skin.     feeding supplement, ENSURE ENLIVE, (ENSURE ENLIVE) LIQD Take 237 mLs by mouth 3 (three) times daily between meals. 237 mL 12   gabapentin (NEURONTIN) 100 MG capsule Take 1 capsule (100 mg total) by  mouth at bedtime. 30 capsule 1   ibuprofen (ADVIL) 200 MG tablet Take 400-600 mg by mouth every 6 (six) hours as needed for headache or moderate pain.     Insulin Degludec (TRESIBA FLEXTOUCH [place]) Inject 15-60 Units into the skin at bedtime.      insulin lispro (HUMALOG KWIKPEN) 100 UNIT/ML KwikPen Inject 20 units under the skin three times daily before meals. (Patient taking differently: Inject 6-10 Units into the skin 3 (three) times daily as needed (high blood sugar or when eating carbs). ) 20 pen 3   Insulin Pen Needle (NOVOFINE PLUS) 32G X 4 MM MISC Use 3 per day to inject insulin 300 each 1   lisinopril-hydrochlorothiazide (ZESTORETIC) 10-12.5 MG tablet Take 0.5 tablets by mouth every evening.      loratadine (CLARITIN) 10 MG tablet Take 10 mg by mouth every evening.      metFORMIN (GLUCOPHAGE-XR) 750 MG 24 hr tablet Take 2,250 mg by mouth daily with supper.     metoCLOPramide (REGLAN) 5 MG tablet Take 1 tablet (5 mg total) by mouth 3 (three) times daily before meals. 90 tablet 0   oxyCODONE (OXY IR/ROXICODONE) 5 MG immediate release tablet Take 1-2 tablets (5-10 mg total) by mouth every 4 (four) hours as needed for moderate pain, severe pain or breakthrough pain. 30 tablet 0   pantoprazole (PROTONIX) 40 MG tablet Take 1 tablet (40 mg total) by mouth daily at 12 noon. 30 tablet 1   potassium chloride SA (K-DUR,KLOR-CON) 20 MEQ tablet TAKE 1 TABLET DAILY (Patient taking differently: Take 20 mEq by mouth every evening. ) 90 tablet 4   traMADol (ULTRAM) 50 MG tablet Take 1-2 tablets (50-100 mg total) by mouth every 6 (six) hours as needed for moderate pain or severe pain. 30 tablet 0   rosuvastatin (CRESTOR) 20 MG tablet      No current facility-administered medications for this encounter.      REVIEW OF SYSTEMS: On review of systems, the patient reports that she is doing well overall. She is gaining some of her energy back. She reports she is eating and feels that this is slowly  improving. She denies any chest pain, shortness of breath, cough, fevers, chills, night sweats. She states that she had about 10 pounds of unintended weight change this summer during her work up. She's slowly gaining a few pounds. She denies any bowel or bladder disturbances, and denies abdominal pain, nausea or vomiting. She denies any new musculoskeletal or joint aches or pains. A complete review of systems is obtained and is otherwise negative.     PHYSICAL EXAM:  Wt  Readings from Last 3 Encounters:  03/25/19 200 lb (90.7 kg)  03/04/19 226 lb 3.1 oz (102.6 kg)  03/02/19 218 lb 12.8 oz (99.2 kg)   Pain Assessment Pain Score: 0-No pain/10  Unable to assess due to encounter type.  ECOG = 1  0 - Asymptomatic (Fully active, able to carry on all predisease activities without restriction)  1 - Symptomatic but completely ambulatory (Restricted in physically strenuous activity but ambulatory and able to carry out work of a light or sedentary nature. For example, light housework, office work)  2 - Symptomatic, <50% in bed during the day (Ambulatory and capable of all self care but unable to carry out any work activities. Up and about more than 50% of waking hours)  3 - Symptomatic, >50% in bed, but not bedbound (Capable of only limited self-care, confined to bed or chair 50% or more of waking hours)  4 - Bedbound (Completely disabled. Cannot carry on any self-care. Totally confined to bed or chair)  5 - Death   Eustace Pen MM, Creech RH, Tormey DC, et al. [phone]). "Toxicity and response criteria of the [facility]". [place] Oncol. 5 (6): 649-55    LABORATORY DATA:  Lab Results  Component Value Date   WBC 7.3 03/14/2019   HGB 9.6 (L) 03/14/2019   HCT 28.5 (L) 03/14/2019   MCV 87.4 03/14/2019   PLT 605 (H) 03/14/2019   Lab Results  Component Value Date   NA 135 03/14/2019   K 3.6 03/14/2019   CL 97 (L) 03/14/2019   CO2 26 03/14/2019   Lab Results   Component Value Date   ALT 15 03/09/2019   AST 13 (L) 03/09/2019   GGT 2,144 (HH) 12/08/2018   ALKPHOS 42 03/09/2019   BILITOT 0.8 03/09/2019      RADIOGRAPHY: Nm Pet (netspot Ga 68 Dotatate) Skull Base To Mid Thigh  Result Date: 03/03/2019 CLINICAL DATA:  Primary malignant neuroendocrine neoplasm of the pancreas. EXAM: NUCLEAR MEDICINE PET SKULL BASE TO THIGH TECHNIQUE: 5.5 mCi Ga 61 DOTATATE was injected intravenously. Full-ring PET imaging was performed from the skull base to thigh after the radiotracer. CT data was obtained and used for attenuation correction and anatomic localization. COMPARISON:  CT 01/29/2019 FINDINGS: NECK No radiotracer activity in neck lymph nodes. Incidental CT findings: None CHEST Within the RIGHT upper lobe rounded nodule measures 9 mm (image 87/3). Within the superior segment of the LEFT lower lobe small cavitary nodule measures 8 mm (image 91/3. These 2 nodules are present on comparison CT from 01/29/2019 with no appreciable change. Both these nodules DO NOT accumulate the somatostatin receptor specific radiotracer. Incidental CT finding:None ABDOMEN/PELVIS pancreatic stent through the pancreatic head. There is NO focal activity within the pancreatic head body or tail to localize pancreatic neuroendocrine tumor. Of note there is typical physiologic activity within the head and uncinate of the pancreas. There is background activity with throughout the pancreas on today's scan without focality. No focal lesions within the liver. Pneumobilia related to sphincterotomy. No hypermetabolic lymph nodes in the upper abdomen. No hypermetabolic pelvic nodes. Large mass posterior LEFT of the uterus measuring 7.5 x 4.7 cm. This mass appears contiguous with the uterus and likely represent leiomyoma. There is moderate radiotracer activity accumulation within the mass. No IV contrast. Ovaries poorly demonstrated. Physiologic activity noted in the liver, spleen, adrenal glands and  kidneys. Incidental CT findings:Exophytic from the RIGHT kidney is a partially calcified cystic lesion measuring 4.1 x 3.3 cm. No  accumulation of radiotracer within this lesion. SKELETON No focal activity to suggest skeletal metastasis. Incidental CT findings:None IMPRESSION: 1. No focal activity within the pancreas to localize well differentiated neuroendocrine tumor. 2. No evidence of liver metastasis. 3. No radiotracer accumulation within suspicious pulmonary nodules. Differential would include non radiotracer avid neuroendocrine tumor (poorly differentiated) versus potentially other metastasis or less likely benign nodules. 4. Suspicious lesion of the RIGHT kidney categorized as Bosniak III cystic renal lesion on diagnostic CT. 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 5. 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 No IV contrast administered. Consider pelvic ultrasound for further evaluation. Electronically Signed   By: Suzy Bouchard M.D.   On: 03/03/2019 13:57       IMPRESSION/PLAN: 1. pT3N2 invasive adenocarcinoma of the pancreas. Dr. Lisbeth Renshaw discusses the pathology findings and reviews the nature of pancreatic cancer. He discusses with the patient that her case was reviewed in GI conference and she would benefit from chemoRT to reduce the risks of local recurrence. Given her nodal involvement, systemic chemotherapy was also discussed she will meet with Dr. Benay Spice next week to review next steps. With regard to radiotherapy, we discussed the risks, benefits, short, and long term effects of radiotherapy, and the patient is interested in proceeding at the appropriate time. Dr. Lisbeth Renshaw discusses the delivery and logistics of radiotherapy and anticipates a course of 5 weeks of radiotherapy. We will follow up after she's met with Dr. Benay Spice to make plans for the radiotherapy component based on his recommendations for start dates. She does understand that if she is to proceed  first with systemic chemo, that we would anticipate chemoRT beginning several months from now.    Given current concerns for patient exposure during the COVID-19 pandemic, this encounter was conducted via telephone.  The patient has given verbal consent for this type of encounter. The time spent during this encounter was 60 minutes and 50% of that time was spent in the coordination of her care. The attendants for this meeting included Dr. Lisbeth Renshaw, Blenda Nicely, RN,  Shona Simpson, [facility] and [name]. Ms. Rabbani sister Moncia Annas joined in on the last 15 minutes of the call as well by phone on speakerphone. During the encounter, Dr. Lisbeth Renshaw, Blenda Nicely, RN, and Shona Simpson [facility] were  located at [facility] Radiation Oncology Department.  Pura Picinich  was located at home.Ms. Whitmore sister Ashland Osmer was also at her own home on speakerphone. The above documentation reflects my direct findings during this shared patient visit. Please see the separate note by Dr. Lisbeth Renshaw on this date for the remainder of the patient's plan of care.    Carola Rhine, PAC

## 2019-03-29 ENCOUNTER — Encounter: Payer: Self-pay | Admitting: Internal Medicine

## 2019-03-30 ENCOUNTER — Inpatient Hospital Stay: Payer: BC Managed Care – PPO

## 2019-03-30 ENCOUNTER — Inpatient Hospital Stay: Payer: BC Managed Care – PPO | Attending: Genetic Counselor | Admitting: Oncology

## 2019-03-30 ENCOUNTER — Telehealth: Payer: Self-pay

## 2019-03-30 ENCOUNTER — Other Ambulatory Visit: Payer: Self-pay

## 2019-03-30 VITALS — BP 123/95 | HR 135 | Temp 98.5°F | Resp 18 | Ht 68.5 in | Wt 196.1 lb

## 2019-03-30 DIAGNOSIS — Z800 Family history of malignant neoplasm of digestive organs: Secondary | ICD-10-CM | POA: Insufficient documentation

## 2019-03-30 DIAGNOSIS — I10 Essential (primary) hypertension: Secondary | ICD-10-CM | POA: Diagnosis not present

## 2019-03-30 DIAGNOSIS — R000 Tachycardia, unspecified: Secondary | ICD-10-CM | POA: Diagnosis not present

## 2019-03-30 DIAGNOSIS — Z905 Acquired absence of kidney: Secondary | ICD-10-CM | POA: Insufficient documentation

## 2019-03-30 DIAGNOSIS — E119 Type 2 diabetes mellitus without complications: Secondary | ICD-10-CM | POA: Diagnosis not present

## 2019-03-30 DIAGNOSIS — C250 Malignant neoplasm of head of pancreas: Secondary | ICD-10-CM

## 2019-03-30 DIAGNOSIS — Z85528 Personal history of other malignant neoplasm of kidney: Secondary | ICD-10-CM | POA: Diagnosis not present

## 2019-03-30 DIAGNOSIS — R918 Other nonspecific abnormal finding of lung field: Secondary | ICD-10-CM | POA: Diagnosis not present

## 2019-03-30 DIAGNOSIS — C7A8 Other malignant neuroendocrine tumors: Secondary | ICD-10-CM | POA: Insufficient documentation

## 2019-03-30 DIAGNOSIS — D3A8 Other benign neuroendocrine tumors: Secondary | ICD-10-CM | POA: Diagnosis not present

## 2019-03-30 MED ORDER — SODIUM CHLORIDE 0.9 % IV SOLN
Freq: Once | INTRAVENOUS | Status: AC
  Administered 2019-03-30: 16:00:00 via INTRAVENOUS
  Filled 2019-03-30: qty 250

## 2019-03-30 NOTE — Patient Instructions (Signed)
Dehydration, Adult  Dehydration is when there is not enough fluid or water in your body. This happens when you lose more fluids than you take in. Dehydration can range from mild to very bad. It should be treated right away to keep it from getting very bad. Symptoms of mild dehydration may include:  Thirst.  Dry lips.  Slightly dry mouth.  Dry, warm skin.  Dizziness. Symptoms of moderate dehydration may include:  Very dry mouth.  Muscle cramps.  Dark pee (urine). Pee may be the color of tea.  Your body making less pee.  Your eyes making fewer tears.  Heartbeat that is uneven or faster than normal (palpitations).  Headache.  Light-headedness, especially when you stand up from sitting.  Fainting (syncope). Symptoms of very bad dehydration may include:  Changes in skin, such as: ? Cold and clammy skin. ? Blotchy (mottled) or pale skin. ? Skin that does not quickly return to normal after being lightly pinched and let go (poor skin turgor).  Changes in body fluids, such as: ? Feeling very thirsty. ? Your eyes making fewer tears. ? Not sweating when body temperature is high, such as in hot weather. ? Your body making very little pee.  Changes in vital signs, such as: ? Weak pulse. ? Pulse that is more than 100 beats a minute when you are sitting still. ? Fast breathing. ? Low blood pressure.  Other changes, such as: ? Sunken eyes. ? Cold hands and feet. ? Confusion. ? Lack of energy (lethargy). ? Trouble waking up from sleep. ? Short-term weight loss. ? Unconsciousness. Follow these instructions at home:   If told by your doctor, drink an ORS: ? Make an ORS by using instructions on the package. ? Start by drinking small amounts, about  cup (120 mL) every 5-10 minutes. ? Slowly drink more until you have had the amount that your doctor said to have.  Drink enough clear fluid to keep your pee clear or pale yellow. If you were told to drink an ORS, finish the  ORS first, then start slowly drinking clear fluids. Drink fluids such as: ? Water. Do not drink only water by itself. Doing that can make the salt (sodium) level in your body get too low (hyponatremia). ? Ice chips. ? Fruit juice that you have added water to (diluted). ? Low-calorie sports drinks.  Avoid: ? Alcohol. ? Drinks that have a lot of sugar. These include high-calorie sports drinks, fruit juice that does not have water added, and soda. ? Caffeine. ? Foods that are greasy or have a lot of fat or sugar.  Take over-the-counter and prescription medicines only as told by your doctor.  Do not take salt tablets. Doing that can make the salt level in your body get too high (hypernatremia).  Eat foods that have minerals (electrolytes). Examples include bananas, oranges, potatoes, tomatoes, and spinach.  Keep all follow-up visits as told by your doctor. This is important. Contact a doctor if:  You have belly (abdominal) pain that: ? Gets worse. ? Stays in one area (localizes).  You have a rash.  You have a stiff neck.  You get angry or annoyed more easily than normal (irritability).  You are more sleepy than normal.  You have a harder time waking up than normal.  You feel: ? Weak. ? Dizzy. ? Very thirsty.  You have peed (urinated) only a small amount of very dark pee during 6-8 hours. Get help right away if:  You have   symptoms of very bad dehydration.  You cannot drink fluids without throwing up (vomiting).  Your symptoms get worse with treatment.  You have a fever.  You have a very bad headache.  You are throwing up or having watery poop (diarrhea) and it: ? Gets worse. ? Does not go away.  You have blood or something green (bile) in your throw-up.  You have blood in your poop (stool). This may cause poop to look black and tarry.  You have not peed in 6-8 hours.  You pass out (faint).  Your heart rate when you are sitting still is more than 100 beats a  minute.  You have trouble breathing. This information is not intended to replace advice given to you by your health care provider. Make sure you discuss any questions you have with your health care provider. Document Released: 03/23/2009 Document Revised: 05/09/2017 Document Reviewed: 07/21/2015 Elsevier Patient Education  2020 [employer]  

## 2019-03-30 NOTE — Progress Notes (Signed)
[facility] New Patient Consult   Requesting MD: Glendale Chard, [address],  [place] [address]   Alyssa Ross 53 y.o.  1966/04/30     Reason for Consult: Pancreas cancer   HPI: [name] saw her primary provider in June with malaise.  She was treated for a urinary tract infection and developed jaundice during this therapy.  She was referred to Dr. Almyra Free and was taken to an ERCP on 12/25/2018.  A stricture was found in the lower third of the main bile duct.  A plastic stent was placed.  Endoscopic ultrasound on 01/01/2019 revealed an irregular mass in the pancreas head measuring 25 mm x 25 mm.  There were 2 lymph nodes adjacent to the head of the pancreas.  No evidence of vascular invasion.  An FNA biopsy was obtained.  The cytology ([id number]) revealed neoplastic cells consistent with well differentiated neuroendocrine tumor.  CTs of the chest, abdomen, pelvis 03/01/2019 revealed multiple lung nodules.  A stent was noted in the distal common bile duct.  Dilatation of the dorsal pancreatic duct extending to the pancreas head.  The pancreas head mass was difficult to see, but appeared to measure 2.3 x 1.8 cm.  The mass was felt to potentially abut a portion of the portal vein.  A complex cystic lesion was noted at the right kidney, possibly a renal cell carcinoma.  There is a right adrenal mass-indeterminate, however report of an outside MRI was consistent with an adenoma.  She was referred for a Netspot scan on 03/03/2019.  The lung nodules did not accumulate the radiotracer.  Moderate radiotracer accumulation involving the left uterus mass felt to represent a leiomyoma.  No accumulation of radiotracer in the renal lesion.  No accumulation in the pancreas.  She was referred to Dr. Barry Dienes and was taken to diagnostic laparoscopy and pancreaticoduodenectomy 03/04/2019.  There was no evidence of carcinomatosis.  She was noted to have a 2.5 cm pancreas head mass.   Dr. Alyson Ingles performed a partial nephrectomy.  The pathology revealed a cystic nephroma of the right kidney.  The pancreas mass returned as a 5.5 cm moderately differentiated adenocarcinoma with invasion of the duodenal wall and vascular groove.  The resection margins are negative.  Lymphovascular and perineural invasion are present.  4 of 34 lymph nodes contained metastatic carcinoma.  She is recovering from surgery.  She reports limited oral intake.  She feels "weak ".  No diarrhea.   Past Medical History:  Diagnosis Date  . ASCUS (atypical squamous cells of undetermined significance) on Pap smear 08/06/1999  . Breast mass in female 2002   Left  .  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 Diabetes mellitus without complication ([place])   . Family history of pancreatic cancer   . Fibroid uterus 2010  . Hypertension   . Irregular bleeding 2011  . 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 PONV (postoperative nausea and vomiting)    nausea  vomitting after 12/25/18 ERCP  . 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 Yeast vaginitis 2006    Past Surgical History:  Procedure Laterality Date  . BILIARY STENT PLACEMENT N/A 12/25/2018   Procedure: BILIARY STENT PLACEMENT;  Surgeon: Carol Ada, MD;  Location: [facility];  Service: Endoscopy;  Laterality: N/A;  . BILIARY STENT PLACEMENT N/A 01/01/2019   Procedure: BILIARY STENT PLACEMENT;  Surgeon: Carol Ada, MD;  Location: [facility];  Service: Endoscopy;  Laterality: N/A;  .  DILATATION & CURETTAGE/HYSTEROSCOPY WITH TRUECLEAR N/A 01/20/2014   Procedure: DILATATION & CURETTAGE/HYSTEROSCOPY WITH TRUCLEAR;  Surgeon: [name], MD;  Location: [facility];  Service: Gynecology;  Laterality: N/A;  . DILATATION & CURRETTAGE/HYSTEROSCOPY WITH RESECTOCOPE N/A 01/20/2014   Procedure: [place];  Surgeon: [name], MD;  Location: [facility];  Service:  Gynecology;  Laterality: N/A;  . ENDOSCOPIC RETROGRADE CHOLANGIOPANCREATOGRAPHY (ERCP) WITH PROPOFOL N/A 12/25/2018   Procedure: ENDOSCOPIC RETROGRADE CHOLANGIOPANCREATOGRAPHY (ERCP) WITH PROPOFOL;  Surgeon: Carol Ada, MD;  Location: [facility];  Service: Endoscopy;  Laterality: N/A;  . ENDOSCOPIC RETROGRADE CHOLANGIOPANCREATOGRAPHY (ERCP) WITH PROPOFOL N/A 01/01/2019   Procedure: ENDOSCOPIC RETROGRADE CHOLANGIOPANCREATOGRAPHY (ERCP) WITH PROPOFOL;  Surgeon: Carol Ada, MD;  Location: [facility];  Service: Endoscopy;  Laterality: N/A;  . ERCP  12/25/2018  . FINE NEEDLE ASPIRATION N/A 01/01/2019   Procedure: FINE NEEDLE ASPIRATION (FNA) LINEAR;  Surgeon: Carol Ada, MD;  Location: [facility];  Service: Endoscopy;  Laterality: N/A;  . LAPAROSCOPY N/A 03/04/2019   Procedure: LAPAROSCOPY DIAGNOSTIC;  Surgeon: Stark Klein, MD;  Location: [facility];  Service: General;  Laterality: N/A;  GENERAL AND EPIDURAL  . NO PAST SURGERIES    . PARTIAL NEPHRECTOMY Right 03/04/2019   Procedure: Nephrectomy Partial;  Surgeon: Cleon Gustin, MD;  Location: [facility];  Service: Urology;  Laterality: Right;  . SPHINCTEROTOMY  12/25/2018   Procedure: SPHINCTEROTOMY;  Surgeon: Carol Ada, MD;  Location: [facility];  Service: Endoscopy;;  . Lavell Islam REMOVAL  01/01/2019   Procedure: STENT REMOVAL;  Surgeon: Carol Ada, MD;  Location: [facility];  Service: Endoscopy;;  . UPPER ESOPHAGEAL ENDOSCOPIC ULTRASOUND (EUS) N/A 01/01/2019   Procedure: UPPER ESOPHAGEAL ENDOSCOPIC ULTRASOUND (EUS);  Surgeon: Carol Ada, MD;  Location: [facility];  Service: Endoscopy;  Laterality: N/A;  . WHIPPLE PROCEDURE N/A 03/04/2019   Procedure: WHIPPLE PROCEDURE;  Surgeon: Stark Klein, MD;  Location: [facility];  Service: General;  Laterality: N/A;  GENERAL AND EPIDURAL    Medications: Reviewed  Allergies:  Allergies  Allergen Reactions  . Cherry Rash  . Lemon Oil Rash    Family history: A paternal first cousin had  pancreas cancer in her 32s, a paternal first cousin had metastatic "cancer".  She has 2 brothers and 2 sisters.  Social History:   She lives alone in [place].  She is a Radio broadcast assistant.  She does not use cigarettes.  She reports rare alcohol use.  Transfusion history.  No risk factor for HIV or hepatitis.  ROS:   Positives include: Jaundice and "knot" urine prior to placement of the bile duct stent, malaise and weight loss prior to placement of biliary stent  A complete ROS was otherwise negative.  Physical Exam:  Blood pressure (!) 123/95, pulse (!) 135, temperature 98.5 F (36.9 C), temperature source Temporal, resp. rate 18, height 5' 8.5" (1.74 m), weight 196 lb 1.6 oz (89 kg), SpO2 100 %.  HEENT: Neck without mass Lungs: Decreased breath sounds at the lower posterior chest bilaterally, no respiratory distress Cardiac: Regular rate and rhythm, tachycardia Abdomen: No hepatosplenomegaly, healed midline incision, no mass, nontender  Vascular: No leg edema Lymph nodes: No cervical, supraclavicular, axillary, or inguinal nodes Neurologic: Alert and oriented the motor exam appears grossly intact in the upper and lower extremities bilaterally, she ambulates without difficulty Skin: No rash   LAB:  CBC  Lab Results  Component Value Date   WBC 7.3 03/14/2019   HGB 9.6 (L) 03/14/2019   HCT 28.5 (L) 03/14/2019  MCV 87.4 03/14/2019   PLT 605 (H) 03/14/2019   NEUTROABS 2.6 03/02/2019        CMP  Lab Results  Component Value Date   NA 135 03/14/2019   K 3.6 03/14/2019   CL 97 (L) 03/14/2019   CO2 26 03/14/2019   GLUCOSE 162 (H) 03/14/2019   BUN 12 03/14/2019   CREATININE 0.84 03/14/2019   CALCIUM 8.9 03/14/2019   PROT 5.8 (L) 03/09/2019   ALBUMIN 3.0 (L) 03/09/2019   AST 13 (L) 03/09/2019   ALT 15 03/09/2019   ALKPHOS 42 03/09/2019   BILITOT 0.8 03/09/2019   GFRNONAA >60 03/14/2019   GFRAA >60 03/14/2019   CA 19-9 on 01/18/2019: 186  Imaging:  As per HPI, CT  images from 01/29/2019-not available in system for review   Assessment/Plan:   1. 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  Tumor invades the duodenal wall and vascular groove, resection margins negative, 4/34 lymph nodes positive  EUS FNA biopsy of pancreas mass on 07/03/2018-well-differentiated neuroendocrine tumor  CTs 01/29/2019-ill-defined pancreas head mass, 5 pulmonary nodules-1 with a small amount of central cavitation, tumor abuts the left margin of the portal vein indistinct density surrounding, hepatic artery, complex cystic lesion of the right kidney, right adrenal mass-characterized as an adenoma on a [facility] MRI 12/21/2018  Netspot 03/03/2019-no focal pancreas activity, no tracer accumulation within the suspicious pulmonary nodules, left uterine mass with tracer accumulation felt to represent a leiomyoma  Elevated preoperative CA 19-9  2. 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 Diabetes 4. Hypertension 5. Family history of pancreas cancer   Disposition:   Ms. Pepperman has been diagnosed with pancreas cancer.  She has stage III disease unless the lung nodules represent metastases.  I will present her case at the GI tumor conference for review of the imaging studies.  She saw Dr. Lisbeth Renshaw.  The plan is to proceed with adjuvant radiation, she is confirmed to have that disease.  I recommend systemic therapy with FOLFIRINOX to be given as adjuvant therapy or first-line treatment for metastatic disease.  We reviewed potential toxicities associated with the FOLFIRINOX regimen including the chance for nausea/vomiting, mucositis, diarrhea, alopecia, and hematologic system.  We discussed the rash, hyperpigmentation, sun sensitivity, and hand/foot syndrome seen with 5-fluorouracil.  We discussed the acute and delayed diarrhea associated with irinotecan.  We discussed the allergic reaction and various types of neuropathy seen with  oxaliplatin.  She agrees to proceed.  She will attend a chemotherapy teaching class.  Ms. Inga was tachycardic and appeared orthostatic on exam today.  She will receive intravenous fluids at the Cancer center.  Her heart rate was lowered when supine.  We will repeat vital signs after she receives the IV fluids.  I encouraged her to push oral hydration at home. She will return for an office visit and chemotherapy teaching class next week. The plan is to proceed with FOLFIRINOX during the week of 04/26/2019 if her clinical status improves.  She will be referred to the genetics counselor.  We will request MSI testing on the resected tumor.  [name], MD  03/30/2019, 5:06 PM

## 2019-03-30 NOTE — Progress Notes (Signed)
Called Dr. Benay Spice and made him aware of orthostatic BP. Instructed patient per Dr. Benay Spice to call the office for shortness of breath or dizziness. Patient verbalized understanding.

## 2019-03-30 NOTE — Progress Notes (Signed)
Placed referral for CSW for emotional support and dietician.

## 2019-03-30 NOTE — Telephone Encounter (Signed)
Called and left message introducing myself as GI Nurse Navigator and provided my direct number for questions and concerns. I will touch base with Alyssa Ross during initial consult today  to provide educational materials and contact information for  treatment team members along with support group and symptom management information. Will follow as needed.

## 2019-03-31 ENCOUNTER — Encounter: Payer: Self-pay | Admitting: *Deleted

## 2019-03-31 ENCOUNTER — Telehealth: Payer: Self-pay | Admitting: Oncology

## 2019-03-31 NOTE — Telephone Encounter (Signed)
I left a message regarding schedule  

## 2019-03-31 NOTE — Progress Notes (Signed)
Per MD request sent email to [facility] requesting MSI testing on case #[id number] and to clarify lymph node status.

## 2019-04-01 ENCOUNTER — Encounter (HOSPITAL_COMMUNITY): Payer: Self-pay | Admitting: General Practice

## 2019-04-01 ENCOUNTER — Telehealth: Payer: Self-pay | Admitting: Oncology

## 2019-04-01 NOTE — Telephone Encounter (Signed)
Scheduled appt per 10/21 sch message - pt is aware of appt - does not think its needed - message sent to MD to verify if still needed.

## 2019-04-01 NOTE — Progress Notes (Signed)
[facility] CSW Progress NOtes  Request received from Merceda Elks RN to reach out to patient to offer support and resources.  Left VM requesting call back at patient's convenience.  Edwyna Shell, LCSW Clinical Social Worker Phone:  [phone] Cell:  [phone]

## 2019-04-02 ENCOUNTER — Other Ambulatory Visit: Payer: Self-pay

## 2019-04-02 ENCOUNTER — Other Ambulatory Visit: Payer: BC Managed Care – PPO

## 2019-04-02 DIAGNOSIS — E1165 Type 2 diabetes mellitus with hyperglycemia: Secondary | ICD-10-CM | POA: Diagnosis not present

## 2019-04-02 DIAGNOSIS — Z794 Long term (current) use of insulin: Secondary | ICD-10-CM | POA: Diagnosis not present

## 2019-04-02 NOTE — Addendum Note (Signed)
Addended by: Kaylyn Lim I on: 04/02/2019 04:20 PM   Modules accepted: Orders

## 2019-04-03 ENCOUNTER — Other Ambulatory Visit: Payer: Self-pay | Admitting: General Surgery

## 2019-04-03 LAB — COMPREHENSIVE METABOLIC PANEL
ALT: 34 IU/L — ABNORMAL HIGH (ref 0–32)
AST: 26 IU/L (ref 0–40)
Albumin/Globulin Ratio: 1.4 (ref 1.2–2.2)
Albumin: 4.3 g/dL (ref 3.8–4.9)
Alkaline Phosphatase: 92 IU/L (ref 39–117)
BUN/Creatinine Ratio: 12 (ref 9–23)
BUN: 13 mg/dL (ref 6–24)
Bilirubin Total: 0.4 mg/dL (ref 0.0–1.2)
CO2: 25 mmol/L (ref 20–29)
Calcium: 9.7 mg/dL (ref 8.7–10.2)
Chloride: 92 mmol/L — ABNORMAL LOW (ref 96–106)
Creatinine, Ser: 1.11 mg/dL — ABNORMAL HIGH (ref 0.57–1.00)
GFR calc Af Amer: 66 mL/min/{1.73_m2} (ref >59)
GFR calc non Af Amer: 57 mL/min/{1.73_m2} — ABNORMAL LOW (ref >59)
Globulin, Total: 3 g/dL (ref 1.5–4.5)
Glucose: 215 mg/dL — ABNORMAL HIGH (ref 65–99)
Potassium: 3.8 mmol/L (ref 3.5–5.2)
Sodium: 135 mmol/L (ref 134–144)
Total Protein: 7.3 g/dL (ref 6.0–8.5)

## 2019-04-03 LAB — HEMOGLOBIN A1C
Est. average glucose Bld gHb Est-mCnc: 192 mg/dL
Hgb A1c MFr Bld: 8.3 % — ABNORMAL HIGH (ref 4.8–5.6)

## 2019-04-06 ENCOUNTER — Encounter: Payer: Self-pay | Admitting: Endocrinology

## 2019-04-06 ENCOUNTER — Other Ambulatory Visit: Payer: Self-pay

## 2019-04-06 ENCOUNTER — Ambulatory Visit (INDEPENDENT_AMBULATORY_CARE_PROVIDER_SITE_OTHER): Payer: BC Managed Care – PPO | Admitting: Endocrinology

## 2019-04-06 DIAGNOSIS — E1165 Type 2 diabetes mellitus with hyperglycemia: Secondary | ICD-10-CM | POA: Diagnosis not present

## 2019-04-06 DIAGNOSIS — Z794 Long term (current) use of insulin: Secondary | ICD-10-CM

## 2019-04-06 NOTE — Progress Notes (Signed)
Patient ID: Alyssa Ross, female   DOB: 02-Dec-1965, 53 y.o.   MRN: [id number]    Reason for Appointment: Diabetes follow-up   Today's office visit was provided via telemedicine using video technique The patient was explained the limitations of evaluation and management by telemedicine and the availability of in person appointments.  The patient understood the limitations and agreed to proceed. Patient also understood that the telehealth visit is billable. . Location of the patient: Patient's home . Location of the provider: Physician office Only the patient and myself were participating in the encounter    History of Present Illness   Diagnosis: Type 2 DIABETES MELITUS, date of diagnosis 2005   She had previously been on metformin and Amaryl Because of inadequate control she was given Victoza in addition in 2011 With this her blood sugars have been significantly better and her A1c has been as low as 6.2 in 2013 Her blood sugars have been more difficult to control since 2014 an A1c consistently over 7% She  was started on insulin with Levemir in 06/2013 because of  increase in her A1c to 8.5% She had previously been taking Victoza along with Amaryl and metformin without consistent control Also had difficulty losing weight despite taking 1.8 mg Victoza, also had tried the 3 mg dose Because of episodic nausea and diarrhea in 1/16 she went off her Victoza  RECENT history:   INSULIN dose: TRESIBA 30-44  units pm.  Humalog recently none  Non-insulin hypoglycemic drugs: on metformin 2250 mg,  A1c previously was 9.2 and now 8.3   Current management, blood sugars and problems identified as follows  She was restarted with taking her METFORMIN on her last visit as renal function was normal  Since she has had a Whipple's procedure surgery she has had marked decrease in her weight and also usually decreased appetite  She stopped taking her Humalog even though she is still  getting occasional readings around 180-200+  Is taking her metformin although may not be taking 3 pills consistently every day  She has taken variable doses of Tresiba based on her blood sugar and appetite and last night took only 30 units  She also had tendency to hypoglycemia overnight on 10/24 and 10/25  Lab glucose recently was 215 nonfasting     Side effects from medications: None   Monitors blood glucose: With freestyle Libre sensor  Sensor not downloaded, patient unable to upload information  PRE-MEAL Fasting  midday Dinner Bedtime Overall  Glucose range:  58-162  62-219  114  183   Mean/median:     ?   POST-MEAL PC Breakfast PC Lunch PC Dinner  Glucose range:    179  Mean/median:       Previous data:  CGM use % of time  88  2-week average/SD  245, was 164  Time in range  20   %  % Time Above 180  31  % Time above 250  48  % Time Below 70  1     PRE-MEAL Fasting Lunch Dinner Bedtime Overall  Glucose range:       Averages:  167  228  310  279    POST-MEAL PC Breakfast PC Lunch PC Dinner  Glucose range:     Averages:  186  263  303      Meals: 3 meals per day. Dinner 8-9 pm     DIET: As above  Physical activity: exercise:  0-6/7  Wt Readings from Last 3 Encounters:  03/30/19 196 lb 1.6 oz (89 kg)  03/25/19 200 lb (90.7 kg)  03/04/19 226 lb 3.1 oz (102.6 kg)    LABS:  Lab Results  Component Value Date   HGBA1C 8.3 (H) 04/02/2019   HGBA1C 8.9 (H) 03/02/2019   HGBA1C 9.2 (H) 11/12/2018   Lab Results  Component Value Date   MICROALBUR 30 12/01/2018   LDLCALC 63 07/11/2018   CREATININE 1.11 (H) 04/02/2019    Lab on 04/02/2019  Component Date Value Ref Range Status  . Hgb A1c MFr Bld 04/02/2019 8.3* 4.8 - 5.6 % Final   Comment:          Prediabetes: 5.7 - 6.4          Diabetes: >6.4          Glycemic control for adults with diabetes: <7.0   . Est. average glucose Bld gHb Est-m* 04/02/2019 192  mg/dL Final  . Glucose 04/02/2019 215*  65 - 99 mg/dL Final  . BUN 04/02/2019 13  6 - 24 mg/dL Final  . Creatinine, Ser 04/02/2019 1.11* 0.57 - 1.00 mg/dL Final  . GFR calc non Af Amer 04/02/2019 57* >59 mL/min/1.73 Final  . GFR calc Af Amer 04/02/2019 66  >59 mL/min/1.73 Final  . BUN/Creatinine Ratio 04/02/2019 12  9 - 23 Final  . Sodium 04/02/2019 135  134 - 144 mmol/L Final  . Potassium 04/02/2019 3.8  3.5 - 5.2 mmol/L Final  . Chloride 04/02/2019 92* 96 - 106 mmol/L Final  . CO2 04/02/2019 25  20 - 29 mmol/L Final  . Calcium 04/02/2019 9.7  8.7 - 10.2 mg/dL Final  . Total Protein 04/02/2019 7.3  6.0 - 8.5 g/dL Final  . Albumin 04/02/2019 4.3  3.8 - 4.9 g/dL Final  . Globulin, Total 04/02/2019 3.0  1.5 - 4.5 g/dL Final  . Albumin/Globulin Ratio 04/02/2019 1.4  1.2 - 2.2 Final  . Bilirubin Total 04/02/2019 0.4  0.0 - 1.2 mg/dL Final  . Alkaline Phosphatase 04/02/2019 92  39 - 117 IU/L Final  . AST 04/02/2019 26  0 - 40 IU/L Final  . ALT 04/02/2019 34* 0 - 32 IU/L Final    Allergies as of 04/06/2019      Reactions   Cherry Rash   Lemon Oil Rash      Medication List       Accurate as of April 06, 2019  2:57 PM. If you have any questions, ask your nurse or doctor.        acetaminophen 325 MG tablet Commonly known as: TYLENOL Take 650 mg by mouth every 6 (six) hours as needed for moderate pain or headache.   baclofen 10 MG tablet Commonly known as: LIORESAL Take 1 tablet (10 mg total) by mouth 3 (three) times daily as needed for muscle spasms.   feeding supplement (PRO-STAT SUGAR FREE 64) Liqd Take 30 mLs by mouth 2 (two) times daily.   FreeStyle Libre 14 Day Reader Kerrin Mo USE READER TO CHECK BLOOD GLUCOSE WITH FREESTYLE LIBRE SENSORS.   FreeStyle Libre 14 Day Sensor Misc APPLY SENSOR TO BODY ONCE EVERY 14 DAYS TO MONITOR BLOOD GLUCOSE   ibuprofen 200 MG tablet Commonly known as: ADVIL Take 400-600 mg by mouth every 6 (six) hours as needed for headache or moderate pain.   insulin lispro 100 UNIT/ML  KwikPen Commonly known as: HumaLOG KwikPen Inject 20 units under the skin three times daily before meals. What changed:   how  much to take  how to take this  when to take this  reasons to take this  additional instructions   Insulin Pen Needle 32G X 4 MM Misc Commonly known as: NovoFine Plus Use 3 per day to inject insulin   lisinopril-hydrochlorothiazide 10-12.5 MG tablet Commonly known as: ZESTORETIC Take 0.5 tablets by mouth every evening.   loratadine 10 MG tablet Commonly known as: CLARITIN Take 10 mg by mouth every evening.   metFORMIN 750 MG 24 hr tablet Commonly known as: GLUCOPHAGE-XR Take 2,250 mg by mouth daily with supper.   metoCLOPramide 5 MG tablet Commonly known as: REGLAN Take 1 tablet (5 mg total) by mouth 3 (three) times daily before meals.   oxyCODONE 5 MG immediate release tablet Commonly known as: Oxy IR/ROXICODONE Take 1-2 tablets (5-10 mg total) by mouth every 4 (four) hours as needed for moderate pain, severe pain or breakthrough pain.   pantoprazole 40 MG tablet Commonly known as: PROTONIX Take 1 tablet (40 mg total) by mouth daily at 12 noon.   potassium chloride SA 20 MEQ tablet Commonly known as: KLOR-CON TAKE 1 TABLET DAILY What changed: when to take this   rosuvastatin 20 MG tablet Commonly known as: CRESTOR Take 20 mg by mouth daily.   traMADol 50 MG tablet Commonly known as: ULTRAM Take 1-2 tablets (50-100 mg total) by mouth every 6 (six) hours as needed for moderate pain or severe pain.   TRESIBA FLEXTOUCH Latimer Inject 15-60 Units into the skin at bedtime.       Allergies:  Allergies  Allergen Reactions  . Cherry Rash  . Lemon Oil Rash    Past Medical History:  Diagnosis Date  . ASCUS (atypical squamous cells of undetermined significance) on Pap smear 08/06/1999  . Breast mass in female 2002   Left  . Cancer [facility])    Neuroendocrine Tumor of the pancreas  . Diabetes mellitus without complication ([place])   . Family  history of pancreatic cancer   . Fibroid uterus 2010  . Hypertension   . Irregular bleeding 2011  . 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 PONV (postoperative nausea and vomiting)    nausea  vomitting after 12/25/18 ERCP  . 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 Yeast vaginitis 2006    Past Surgical History:  Procedure Laterality Date  . BILIARY STENT PLACEMENT N/A 12/25/2018   Procedure: BILIARY STENT PLACEMENT;  Surgeon: Carol Ada, MD;  Location: [facility];  Service: Endoscopy;  Laterality: N/A;  . BILIARY STENT PLACEMENT N/A 01/01/2019   Procedure: BILIARY STENT PLACEMENT;  Surgeon: Carol Ada, MD;  Location: [facility];  Service: Endoscopy;  Laterality: N/A;  . DILATATION & CURETTAGE/HYSTEROSCOPY WITH TRUECLEAR N/A 01/20/2014   Procedure: DILATATION & CURETTAGE/HYSTEROSCOPY WITH TRUCLEAR;  Surgeon: [name], MD;  Location: [place] [facility];  Service: Gynecology;  Laterality: N/A;  . DILATATION & CURRETTAGE/HYSTEROSCOPY WITH RESECTOCOPE N/A 01/20/2014   Procedure: [place];  Surgeon: [name], MD;  Location: [place] [facility];  Service: Gynecology;  Laterality: N/A;  . ENDOSCOPIC RETROGRADE CHOLANGIOPANCREATOGRAPHY (ERCP) WITH PROPOFOL N/A 12/25/2018   Procedure: ENDOSCOPIC RETROGRADE CHOLANGIOPANCREATOGRAPHY (ERCP) WITH PROPOFOL;  Surgeon: Carol Ada, MD;  Location: [facility];  Service: Endoscopy;  Laterality: N/A;  . ENDOSCOPIC RETROGRADE CHOLANGIOPANCREATOGRAPHY (ERCP) WITH PROPOFOL N/A 01/01/2019   Procedure: ENDOSCOPIC RETROGRADE CHOLANGIOPANCREATOGRAPHY (ERCP) WITH PROPOFOL;  Surgeon: Carol Ada, MD;  Location: [facility];  Service: Endoscopy;  Laterality: N/A;  . ERCP  12/25/2018  . FINE  NEEDLE ASPIRATION N/A 01/01/2019   Procedure: FINE NEEDLE ASPIRATION (FNA) LINEAR;  Surgeon: Carol Ada, MD;  Location: [facility];  Service: Endoscopy;  Laterality: N/A;  . LAPAROSCOPY N/A  03/04/2019   Procedure: LAPAROSCOPY DIAGNOSTIC;  Surgeon: Stark Klein, MD;  Location: [place];  Service: General;  Laterality: N/A;  GENERAL AND EPIDURAL  . NO PAST SURGERIES    . PARTIAL NEPHRECTOMY Right 03/04/2019   Procedure: Nephrectomy Partial;  Surgeon: Cleon Gustin, MD;  Location: [place];  Service: Urology;  Laterality: Right;  . SPHINCTEROTOMY  12/25/2018   Procedure: SPHINCTEROTOMY;  Surgeon: Carol Ada, MD;  Location: [facility];  Service: Endoscopy;;  . Lavell Islam REMOVAL  01/01/2019   Procedure: STENT REMOVAL;  Surgeon: Carol Ada, MD;  Location: [facility];  Service: Endoscopy;;  . UPPER ESOPHAGEAL ENDOSCOPIC ULTRASOUND (EUS) N/A 01/01/2019   Procedure: UPPER ESOPHAGEAL ENDOSCOPIC ULTRASOUND (EUS);  Surgeon: Carol Ada, MD;  Location: [facility];  Service: Endoscopy;  Laterality: N/A;  . WHIPPLE PROCEDURE N/A 03/04/2019   Procedure: WHIPPLE PROCEDURE;  Surgeon: Stark Klein, MD;  Location: [facility];  Service: General;  Laterality: N/A;  GENERAL AND EPIDURAL    Family History  Problem Relation Age of Onset  . Diabetes Mother   . Dementia Mother   . Hyperlipidemia Mother   . Hypertension Mother   . Irregular heart beat Mother   . Diabetes Father   . Hypertension Father   . Hyperlipidemia Father   . Down syndrome Sister   . Diabetes Sister   . Hyperlipidemia Brother   . Heart Problems Brother   . Goiter Maternal Aunt   . Thyroid nodules Sister   . Cancer Cousin 60       eye; maternal first cousin  . Goiter Cousin   . Cancer Paternal Aunt        unknown form of cancer  . Cancer Cousin        unknown form of cancer; paternal first cousin  . Pancreatic cancer Cousin 66       paternal first cousin  . Cancer Cousin 21       unknown cancer; paternal first cousin  . Cancer Cousin 57       unknown cancer; paternal first cousin    Social History:  reports that she has never smoked. She has never used smokeless tobacco. She reports current alcohol use. She  reports that she does not use drugs.  REVIEW of systems:   She is being followed by her PCP for hypertension Off treatment with Zestoretic 20 mg now because of her symptomatic lower blood pressure readings and decreased weight and appetite   BP Readings from Last 3 Encounters:  03/30/19 94/74  03/30/19 (!) 123/95  03/14/19 (!) 157/99     Has had hypercholesterolemia, was taking Crestor 20 mg with good control, has history of mild increase in triglycerides also She has been told by PCP not to take any Crestor  Labs as follows  Lab Results  Component Value Date   CHOL 129 07/11/2018   HDL 52 07/11/2018   LDLCALC 63 07/11/2018   LDLDIRECT 97.7 02/11/2014   TRIG 69 07/11/2018   CHOLHDL 2.5 07/11/2018      Examination:   There were no vitals taken for this visit.  There is no height or weight on file to calculate BMI.    Assessment/Plan:   Diabetes type 2, uncontrolled  See history of present illness for detailed discussion of her current management, blood sugar patterns  and problems identified  Her A1c is usually over 8% and now 8.3  She is insulin-dependent With her marked weight loss postoperatively her blood sugars are overall improving and she is taking generally less insulin With her decreased appetite she has less postprandial hyperglycemia However likely that her blood sugars will start going up when she is resuming her diet better  Recently fasting readings are relatively good She has reduced her Tyler Aas because of a couple of episodes of low sugars overnight However is not checking blood sugars enough and this was discussed  Recommendations:  Take a stable dose of 30 units of Tresiba daily for now and adjust up or down 4 units only if morning sugars are out of line  She will need to take 4 to 6 units NovoLog at least for any meals with carbohydrate  Consistent monitoring with her freestyle libre at least 4 times a day including after meals  If she is  going to get high-dose steroids for chemotherapy she will need 20 to 30 units of NovoLog when blood sugars go up over 200  At the same time she will need at least 50 to 60 units of Tresiba for 2 to 3 days to counteract hyperglycemia  May reduce Metformin to 2 tablets for now  Make sure she is well-hydrated when blood sugars are getting higher  HYPERTENSION: Currently not on treatment as blood pressure has been low normal, she can continue to be monitored  Follow-up in 2 months  There are no Patient Instructions on file for this visit.    Counseling time on subjects discussed in assessment and plan sections is over 50% of today's 25 minute visit   Alyssa Ross 04/06/2019, 2:57 PM   Note: This office note was prepared with Dragon voice recognition system technology. Any transcriptional errors that result from this process are unintentional.

## 2019-04-07 ENCOUNTER — Telehealth: Payer: Self-pay

## 2019-04-07 NOTE — Telephone Encounter (Signed)
Called and left VM to assess for needs/barriers and to review appointment on 10/29.

## 2019-04-08 ENCOUNTER — Inpatient Hospital Stay: Payer: BC Managed Care – PPO | Admitting: Nutrition

## 2019-04-08 ENCOUNTER — Inpatient Hospital Stay: Payer: BC Managed Care – PPO

## 2019-04-08 ENCOUNTER — Inpatient Hospital Stay: Payer: BC Managed Care – PPO | Admitting: General Practice

## 2019-04-08 ENCOUNTER — Other Ambulatory Visit: Payer: Self-pay

## 2019-04-08 ENCOUNTER — Inpatient Hospital Stay (HOSPITAL_BASED_OUTPATIENT_CLINIC_OR_DEPARTMENT_OTHER): Payer: BC Managed Care – PPO | Admitting: Oncology

## 2019-04-08 ENCOUNTER — Telehealth: Payer: Self-pay | Admitting: Oncology

## 2019-04-08 VITALS — BP 138/96 | HR 100 | Temp 97.9°F | Resp 18 | Ht 68.0 in | Wt 194.7 lb

## 2019-04-08 DIAGNOSIS — C250 Malignant neoplasm of head of pancreas: Secondary | ICD-10-CM

## 2019-04-08 DIAGNOSIS — R918 Other nonspecific abnormal finding of lung field: Secondary | ICD-10-CM | POA: Diagnosis not present

## 2019-04-08 DIAGNOSIS — I10 Essential (primary) hypertension: Secondary | ICD-10-CM | POA: Diagnosis not present

## 2019-04-08 DIAGNOSIS — Z800 Family history of malignant neoplasm of digestive organs: Secondary | ICD-10-CM | POA: Diagnosis not present

## 2019-04-08 DIAGNOSIS — R000 Tachycardia, unspecified: Secondary | ICD-10-CM | POA: Diagnosis not present

## 2019-04-08 DIAGNOSIS — Z85528 Personal history of other malignant neoplasm of kidney: Secondary | ICD-10-CM | POA: Diagnosis not present

## 2019-04-08 DIAGNOSIS — Z905 Acquired absence of kidney: Secondary | ICD-10-CM | POA: Diagnosis not present

## 2019-04-08 DIAGNOSIS — E119 Type 2 diabetes mellitus without complications: Secondary | ICD-10-CM | POA: Diagnosis not present

## 2019-04-08 DIAGNOSIS — C7A8 Other malignant neuroendocrine tumors: Secondary | ICD-10-CM | POA: Diagnosis not present

## 2019-04-08 NOTE — Progress Notes (Signed)
53 year old female diagnosed with pancreas cancer status post Whipple and partial nephrectomy on September 24.  She will receive adjuvant radiation therapy and systemic FOLFIRINOX.  She is a patient of Dr. Benay Spice.  Past medical history includes diabetes and hypertension.  Medications include Humalog, Glucophage, Reglan, Protonix, and Crestor.    Labs include glucose 215, creatinine 1.11, BUN 13.  Height: 68.5 inches. Weight: 196.1 pounds on October 20. Usual body weight: 241 pounds January 2020. BMI: 29.38.  Patient presents to nutrition consult alone. She reports she will be beginning chemotherapy soon.  She is approximately 5 weeks out from her surgery and wants to begin advancing her diet. She is a little afraid to try new foods secondary to intolerance issues. She has a history of lactose intolerance.  She reports early satiety. Nutrition focused physical exam deferred.  Nutrition diagnosis: Unintended weight loss related to pancreas cancer and associated treatments as evidenced by 19% weight loss over 10 months.  Intervention: Patient educated on strategies for diet advancement.  Recommended patient keep a food journal and document the food she has tried and whether or not tolerated. Recommended small amounts of food more often throughout the day. Encouraged high-protein foods for healing.  Provided fact sheet on diet after Whipple surgery. Also provided information on sugar and cancer per patient request. Contact information was provided.  Questions were answered.  Teach back method used.  Monitoring, evaluation, goals: Patient will tolerate adequate calories and protein for weight maintenance and healing.  Next visit: To be scheduled with treatment as needed.  **Disclaimer: This note was dictated with voice recognition software. Similar sounding words can inadvertently be transcribed and this note may contain transcription errors which may not have been corrected upon  publication of note.**

## 2019-04-08 NOTE — Progress Notes (Signed)
  [facility] OFFICE PROGRESS NOTE   Diagnosis: Pancreas cancer  INTERVAL HISTORY:   Alyssa Ross returns as scheduled.  She continues to recover from the Whipple procedure.  No nausea.  She is eating.  No diarrhea.  She is scheduled for Port-A-Cath placement on 04/21/2019. Alyssa Ross attended a chemotherapy teaching class earlier today. Objective:  Vital signs in last 24 hours:  Blood pressure (!) 138/96, pulse 100, temperature 97.9 F (36.6 C), temperature source Temporal, resp. rate 18, height 5\' 8"  (1.727 m), weight 194 lb 11.2 oz (88.3 kg), SpO2 99 %.    GI: Healed midline incision, no hepatomegaly, nontender Vascular: No leg edema   Lab Results:  Lab Results  Component Value Date   WBC 7.3 03/14/2019   HGB 9.6 (L) 03/14/2019   HCT 28.5 (L) 03/14/2019   MCV 87.4 03/14/2019   PLT 605 (H) 03/14/2019   NEUTROABS 2.6 03/02/2019    CMP  Lab Results  Component Value Date   NA 135 04/02/2019   K 3.8 04/02/2019   CL 92 (L) 04/02/2019   CO2 25 04/02/2019   GLUCOSE 215 (H) 04/02/2019   BUN 13 04/02/2019   CREATININE 1.11 (H) 04/02/2019   CALCIUM 9.7 04/02/2019   PROT 7.3 04/02/2019   ALBUMIN 4.3 04/02/2019   AST 26 04/02/2019   ALT 34 (H) 04/02/2019   ALKPHOS 92 04/02/2019   BILITOT 0.4 04/02/2019   GFRNONAA 57 (L) 04/02/2019   GFRAA 66 04/02/2019    Medications: I have reviewed the patient's current medications.   Assessment/Plan: 1. 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  Tumor invades the duodenal wall and vascular groove, resection margins negative, 4/34 lymph nodes positive  EUS FNA biopsy of pancreas mass on 07/03/2018-well-differentiated neuroendocrine tumor  CTs 01/29/2019-ill-defined pancreas head mass, 5 pulmonary nodules-1 with a small amount of central cavitation, tumor abuts the left margin of the portal vein indistinct density surrounding, hepatic artery, complex cystic lesion of the right  kidney, right adrenal mass-characterized as an adenoma on a [facility] MRI 12/21/2018  [facility] 03/03/2019-no focal pancreas activity, no tracer accumulation within the suspicious pulmonary nodules, left uterine mass with tracer accumulation felt to represent a leiomyoma  Elevated preoperative CA 19-9  2. 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 Diabetes 4. Hypertension 5. Family history of pancreas cancer    Disposition: Alyssa Ross appears well.  She has been diagnosed with pancreas cancer.  I will present her case at the GI tumor conference yesterday.  We reviewed the CT images from August.  There are multiple lung nodules, suspicious for metastatic disease.  The conference recommendation is to obtain a repeat CT to look for evidence of interval growth.  I discussed the chest CT finding and implications with Alyssa Ross.  She understands that she will have metastatic pancreas cancer if the lung nodules represent metastases.  No therapy will be curative if she has stage IV disease.  I recommend FOLFIRINOX to be given as adjuvant therapy or first-line treatment of metastatic disease.  She attended a chemotherapy teaching class today.  I reviewed potential toxicities associated with the FOLFIRINOX regimen and she agrees to proceed.  The plan is to begin FOLFIRINOX on 04/27/2019.  She will be seen for an office and lab visit that day.  [name], MD  04/08/2019  3:43 PM

## 2019-04-08 NOTE — Progress Notes (Signed)
START ON PATHWAY REGIMEN - Pancreatic Adenocarcinoma     A cycle is every 14 days:     Oxaliplatin      Leucovorin      Irinotecan      Fluorouracil   **Always confirm dose/schedule in your pharmacy ordering system**  Patient Characteristics: No Distant Metastases, Resectable, Adjuvant, Negative Margins (Includes Positive Lymph Nodes) Current evidence of distant metastases<= No AJCC T Category: Staged < 8th Ed. AJCC N Category: Staged < 8th Ed. AJCC M Category: Staged < 8th Ed. AJCC 8 Stage Grouping: Staged < 8th Ed. Treatment Setting: Adjuvant Intent of Therapy: Curative Intent, Discussed with Patient

## 2019-04-08 NOTE — Telephone Encounter (Signed)
Scheduled per 10/29 los, patient received after visit summary and calender.

## 2019-04-08 NOTE — Progress Notes (Signed)
Alyssa Ross Initial Psychosocial Assessment Clinical Social Work  Clinical Social Work contacted by phone to assess psychosocial, emotional, mental health, and spiritual needs of the patient.   Barriers to care/review of distress screen:  - Transportation:  Do you anticipate any problems getting to appointments?  Do you have someone who can help run errands for you if you need it?  No concerns - Help at home:  What is your living situation (alone, family, other)?  If you are physically unable to care for yourself, who would you call on to help you?  Lives in her own apartment; however is caregiver for elderly mother w dementia and adult sister w Down Syndrome both of whom live in unit near patient.  In addition to working full time, patient has been fully responsible for care for mother and sister.   - Support system:  What does your support system look like?  Who would you call on if you needed some kind of practical help?  What if you needed someone to talk to for emotional support?  Sister has come to [facility] to help w mothers, sisters and now her own care.  Sister may have to return to her own home out of state at some point in the future.  Family wants to continue to provide care for sister in the home - have not found any resources for in home caregiving (other than private pay options) as sister is not eligible for Medicaid.  Family hoping that  Mother will be placed in SNF - she is currently inpatient at [facility].  Care planning is in progress, patient connected w CSW covering her mother's care.   - Finances:  Are you concerned about finances   Considering returning to work?  If not, applying for disability?  On medical leave from job, no financial concerns at present other than cost of caregiving for sister.    What is your understanding of where you are with your cancer? Its cause?  Your treatment plan and what happens next?  New diagnosis of pancreatic cancer,  Had surgery "to remove it all" in  September, still recovering.  Anticipates starting aggressive chemotherapy in November, then aware that she will need 5 weeks of daily radiation and a "chemo pill."  Has not fully processed what is going on w her cancer diagnosis due to significant caregiving responsibilities w mother and sister.      What are your worries for the future as you begin treatment for cancer?  Worries that "it is a lot" of medication being given in the chemotherapy.  Concerned about side effects and her ability to continue to care for her sister.    What are your hopes and priorities during your treatment? What is important to you? What are your goals for your care?  Hopes to be able to return to work in Jan 2021.    CSW Summary:  Patient and family psychosocial functioning including strengths, limitations, and coping skills: New and unexpected diagnosis of pancreatic cancer, stage 2B.  Treatment plan is surgery, chemo and radiation.  Treatment will last 6 months approx.  Lives alone but is caregiver for elderly mother w dementia and sister w Down syndrome.  Mother currently hospitalized a [facility] w possible stroke.  Patient has been 100% responsible for their care for years, another sister has recently come to [facility] to assist while patient recovers.  Family hopes mother will be placed in SNF for rehab. Plan is for sister to remain in  the home as she has difficulty adjusting to new situations/people and is very attached to her mother and sisters.  Family has used private duty sitters to assist.  Patient also has diabetes which has been somewhat difficult to control (low blood sugars) during this high stress time.  Understandably, patient is not looking forward to upcoming chemo treatments.  Hopes to return to her full time job in early 2021 after several rounds of chemo.    Identifications of barriers to care:  Stress in the home due to caregiver strain, need for additional help caring for sister and mother.  Neither  eligible for Medicaid thus it has been difficult for patient to find resources for additional caregiving.    Availability of community resources:  [employer], newsletter and materials provided.  Connected patient w inpatient TOC team to begin process of discussing mother's post discharge planning.   Clinical Social Worker follow up needed: Yes, will schedule follow up when she is next at [facility].  Edwyna Shell, LCSW Clinical Social Worker Phone:  [phone] Cell: [phone]

## 2019-04-12 ENCOUNTER — Other Ambulatory Visit (HOSPITAL_COMMUNITY)
Admission: RE | Admit: 2019-04-12 | Discharge: 2019-04-12 | Disposition: A | Discharge status: Home / Self Care | Payer: BC Managed Care – PPO | Source: Ambulatory Visit | Attending: Oncology | Admitting: Oncology

## 2019-04-12 ENCOUNTER — Encounter: Payer: BC Managed Care – PPO | Admitting: Genetic Counselor

## 2019-04-12 ENCOUNTER — Other Ambulatory Visit: Payer: BC Managed Care – PPO

## 2019-04-12 DIAGNOSIS — C259 Malignant neoplasm of pancreas, unspecified: Secondary | ICD-10-CM | POA: Insufficient documentation

## 2019-04-16 ENCOUNTER — Ambulatory Visit (HOSPITAL_COMMUNITY)
Admission: RE | Admit: 2019-04-16 | Discharge: 2019-04-16 | Disposition: A | Discharge status: Home / Self Care | Payer: BC Managed Care – PPO | Source: Ambulatory Visit | Attending: Oncology | Admitting: Oncology

## 2019-04-16 ENCOUNTER — Other Ambulatory Visit: Payer: Self-pay

## 2019-04-16 DIAGNOSIS — C250 Malignant neoplasm of head of pancreas: Secondary | ICD-10-CM | POA: Insufficient documentation

## 2019-04-16 DIAGNOSIS — R918 Other nonspecific abnormal finding of lung field: Secondary | ICD-10-CM | POA: Diagnosis not present

## 2019-04-17 ENCOUNTER — Other Ambulatory Visit (HOSPITAL_COMMUNITY)
Admission: RE | Admit: 2019-04-17 | Discharge: 2019-04-17 | Disposition: A | Discharge status: Home / Self Care | Payer: BC Managed Care – PPO | Source: Ambulatory Visit | Attending: General Surgery | Admitting: General Surgery

## 2019-04-17 DIAGNOSIS — Z01812 Encounter for preprocedural laboratory examination: Secondary | ICD-10-CM | POA: Insufficient documentation

## 2019-04-17 DIAGNOSIS — Z20828 Contact with and (suspected) exposure to other viral communicable diseases: Secondary | ICD-10-CM | POA: Insufficient documentation

## 2019-04-18 LAB — NOVEL CORONAVIRUS, NAA (HOSP ORDER, SEND-OUT TO REF LAB; TAT 18-24 HRS): SARS-CoV-2, NAA: NOT DETECTED

## 2019-04-19 ENCOUNTER — Encounter (HOSPITAL_COMMUNITY): Payer: Self-pay | Admitting: *Deleted

## 2019-04-19 ENCOUNTER — Other Ambulatory Visit: Payer: Self-pay

## 2019-04-19 NOTE — H&P (Signed)
[name] Documented: 03/22/2019 9:56 AM Location: [facility] Patient #: [id number] DOB: February 21, 1966 Single / Language: Cleophus Molt / Race: Black or African American Female   History of Present Illness Stark Klein MD; 03/22/2019 11:02 AM) The patient is a 53 year old female who presents for a follow-up for Pancreatic cancer. Pt is a 53 yo F who is referred for consultation by Dr. Benson Norway for diagnosis of malignant mass of pancreatic head 12/2018. She presented to PCP with fatigue 11/2018. She was found to have a UTI and was treated. Very shortly thereafter, she became jaundiced and developed pruritus. She lost around 10 pounds during this process due to lack of appetite. She was seen to have dilated ducts on u/s. MRI confirmed this and showed a 2.5 cm pancreatic head mass. She was referred to Dr. Benson Norway and underwent EUS/ERCP. The mass was worrisome for adenoca on u/s appearance and there were two lymph nodes seen. 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 She has been able to regain some of the weight back. She has not had diarrhea. She is diabetic, but this is not a new dx. She also has a first cousin who died of pancreatic cancer and a different first cousin (same side of family) who died of metastatic cancer of unknown primary. She was accompanied by her brother in person and her sister by face time.    Pt underwent dx laparoscopy and whipple 03/04/2019. She had a relatively uneventful post op course. She had 4/34 LN positive, tumor was 5.5 cm (much larger than the 2.5 cm estimate on imaging). SMV groove was positive. Additionally she had some indeterminate pulmonary lesions on her chest CT.   pathology 03/04/2019. SURGICAL PATHOLOGY CASE: [id number] PATIENT: Lateisha Wank Surgical Pathology Report     Clinical History: Malignant neuroendocrine tumor of pancreas (cm)     DIAGNOSIS:  A. 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 LYMPH NODES, PORTAL, EXCISION: - No carcinoma identified in fourteen lymph nodes (0/14)  C. KIDNEY, RIGHT, PARTIAL NEPHRECTOMY: - Features consistent with cystic nephroma - See comment   Allergies (Sabrina Canty, CMA; 03/22/2019 9:56 AM) No Known Allergies [01/18/2019]: No Known Drug Allergies [01/18/2019]: Allergies Reconciled   Medication History Nance Pew, CMA; 03/22/2019 9:57 AM) Ensure Enlive (237 Oral three times daily between meals, Taken starting 03/16/2019) Active. (1 case) Pro-Stat 64 (30 Oral two times daily, Taken starting 03/16/2019) Active. (Pro-STAT Sugar Free 64) Pantoprazole Sodium (40MG  Tablet DR, 1 (one) Oral daily at 12 noon, Taken starting 03/15/2019) Active. Tyler Aas FlexTouch (100UNIT/ML Soln Pen-inj, Subcutaneous) Active. HumaLOG KwikPen (100UNIT/ML Soln Pen-inj, Subcutaneous) Active. Cephalexin (500MG  Capsule, Oral) Active. Lisinopril-hydroCHLOROthiazide (10-12.5MG  Tablet, Oral) Active. Potassium Chloride Crys ER (20MEQ Tablet ER, Oral) Active. Medications Reconciled    Review of Systems Stark Klein MD; 03/22/2019 11:02 AM) All other systems negative  Vitals (Sabrina Canty CMA; 03/22/2019 9:58 AM) 03/22/2019 9:57 AM Weight: 199.5 lb Height: 68in Body Surface Area: 2.04 m Body Mass Index: 30.33 kg/m  Temp.: 25F(Temporal)  Pulse: 155 (Regular)  BP: 146/84 (Sitting, Left Arm, Standard)       Physical Exam Stark Klein MD; 03/22/2019 11:03 AM) General Note: alert and oriented. no distress.   Abdomen Note: soft, non tender, non distended.     Assessment &  Plan Stark Klein MD; 03/22/2019 11:06  AM) ADENOCARCINOMA OF HEAD OF PANCREAS (C25.0) Impression: Worse dx with adenocarcinoma with 4/34 nodes positive. Will need chemo. ? radiation with SMV positive margin.  Will refer to oncology and radiation oncology. Will plan port placement. Reviewed risks and benefits. Current Plans Pt Education - ccs port insertion education Follow up with [place] in the office in 1 month.  Call us sooner as needed.    Signed electronically by Stark Klein, MD (03/22/2019 11:07 AM)

## 2019-04-19 NOTE — Progress Notes (Signed)
Spoke with pt for pre-op call. Pt denies cardiac history. Pt is a type 2 diabetic. Last A1C was 8.6 on 04/02/19. Pt states her fasting blood sugar has been "all over the place" recently. States it can drop to the 40's and be as high as 150. Pt instructed to take 1/2 of her dose Tyler Aas (uses a "sliding scale" dose) Tuesday PM. Instructed her to check her blood sugar Wednesday AM when she gets up and every 2 hours until she leaves for the hospital. If blood sugar is >220 take 1/2 of usual correction dose of Novolog insulin. If blood sugar is 70 or below, treat with 1/2 cup of clear juice (apple or cranberry) and recheck blood sugar 15 minutes after drinking juice. If blood sugar continues to be 70 or below, call the Short Stay department and ask to speak to a nurse. She voiced understanding.  Pt had her Covid test done on 04/17/19 and it is negative. Pt states she has been in quarantine since the test was done and will continue to do so until day of surgery.  Visitation policy reviewed with pt and she voiced understanding.

## 2019-04-21 ENCOUNTER — Ambulatory Visit (HOSPITAL_COMMUNITY): Payer: BC Managed Care – PPO

## 2019-04-21 ENCOUNTER — Other Ambulatory Visit: Payer: Self-pay

## 2019-04-21 ENCOUNTER — Encounter (HOSPITAL_COMMUNITY): Payer: Self-pay

## 2019-04-21 ENCOUNTER — Ambulatory Visit (HOSPITAL_COMMUNITY): Payer: BC Managed Care – PPO | Admitting: Anesthesiology

## 2019-04-21 ENCOUNTER — Encounter (HOSPITAL_COMMUNITY)
Admission: RE | Disposition: A | Discharge status: Home / Self Care | Payer: Self-pay | Source: Home / Self Care | Attending: General Surgery

## 2019-04-21 ENCOUNTER — Ambulatory Visit (HOSPITAL_COMMUNITY)
Admission: RE | Admit: 2019-04-21 | Discharge: 2019-04-21 | Disposition: A | Discharge status: Home / Self Care | Payer: BC Managed Care – PPO | Attending: General Surgery | Admitting: General Surgery

## 2019-04-21 DIAGNOSIS — Z452 Encounter for adjustment and management of vascular access device: Secondary | ICD-10-CM | POA: Diagnosis not present

## 2019-04-21 DIAGNOSIS — E1165 Type 2 diabetes mellitus with hyperglycemia: Secondary | ICD-10-CM | POA: Diagnosis not present

## 2019-04-21 DIAGNOSIS — E7800 Pure hypercholesterolemia, unspecified: Secondary | ICD-10-CM | POA: Diagnosis not present

## 2019-04-21 DIAGNOSIS — Z95828 Presence of other vascular implants and grafts: Secondary | ICD-10-CM

## 2019-04-21 DIAGNOSIS — C250 Malignant neoplasm of head of pancreas: Secondary | ICD-10-CM | POA: Diagnosis not present

## 2019-04-21 DIAGNOSIS — I10 Essential (primary) hypertension: Secondary | ICD-10-CM | POA: Diagnosis not present

## 2019-04-21 DIAGNOSIS — Z419 Encounter for procedure for purposes other than remedying health state, unspecified: Secondary | ICD-10-CM

## 2019-04-21 HISTORY — PX: PORTACATH PLACEMENT: SHX2246

## 2019-04-21 HISTORY — DX: Anemia, unspecified: D64.9

## 2019-04-21 LAB — BASIC METABOLIC PANEL
Anion gap: 10 (ref 5–15)
BUN: 13 mg/dL (ref 6–20)
CO2: 29 mmol/L (ref 22–32)
Calcium: 8.7 mg/dL — ABNORMAL LOW (ref 8.9–10.3)
Chloride: 102 mmol/L (ref 98–111)
Creatinine, Ser: 1.09 mg/dL — ABNORMAL HIGH (ref 0.44–1.00)
GFR calc Af Amer: >60 mL/min (ref >60)
GFR calc non Af Amer: 58 mL/min — ABNORMAL LOW (ref >60)
Glucose, Bld: 108 mg/dL — ABNORMAL HIGH (ref 70–99)
Potassium: 3.2 mmol/L — ABNORMAL LOW (ref 3.5–5.1)
Sodium: 141 mmol/L (ref 135–145)

## 2019-04-21 LAB — GLUCOSE, CAPILLARY
Glucose-Capillary: 98 mg/dL (ref 70–99)
Glucose-Capillary: 108 mg/dL — ABNORMAL HIGH (ref 70–99)

## 2019-04-21 LAB — CBC
HCT: 32.8 % — ABNORMAL LOW (ref 36.0–46.0)
Hemoglobin: 10.2 g/dL — ABNORMAL LOW (ref 12.0–15.0)
MCH: 27.8 pg (ref 26.0–34.0)
MCHC: 31.1 g/dL (ref 30.0–36.0)
MCV: 89.4 fL (ref 80.0–100.0)
Platelets: 412 10*3/uL — ABNORMAL HIGH (ref 150–400)
RBC: 3.67 MIL/uL — ABNORMAL LOW (ref 3.87–5.11)
RDW: 15 % (ref 11.5–15.5)
WBC: 4.7 10*3/uL (ref 4.0–10.5)
nRBC: 0 % (ref 0.0–0.2)

## 2019-04-21 SURGERY — INSERTION, TUNNELED CENTRAL VENOUS DEVICE, WITH PORT
Anesthesia: General | Site: Chest | Laterality: Left

## 2019-04-21 MED ORDER — BUPIVACAINE HCL (PF) 0.25 % IJ SOLN
INTRAMUSCULAR | Status: DC | PRN
  Administered 2019-04-21: 5 mL

## 2019-04-21 MED ORDER — PROPOFOL 10 MG/ML IV BOLUS
INTRAVENOUS | Status: DC | PRN
  Administered 2019-04-21: 200 mg via INTRAVENOUS

## 2019-04-21 MED ORDER — CHLORHEXIDINE GLUCONATE CLOTH 2 % EX PADS: 6.0000 | MEDICATED_PAD | Freq: Once | CUTANEOUS | Status: DC

## 2019-04-21 MED ORDER — MIDAZOLAM HCL 5 MG/5ML IJ SOLN
INTRAMUSCULAR | Status: DC | PRN
  Administered 2019-04-21 (×2): 2 mg via INTRAVENOUS

## 2019-04-21 MED ORDER — LIDOCAINE 2% (20 MG/ML) 5 ML SYRINGE
INTRAMUSCULAR | Status: DC | PRN
  Administered 2019-04-21: 60 mg via INTRAVENOUS

## 2019-04-21 MED ORDER — CEFAZOLIN SODIUM-DEXTROSE 2-4 GM/100ML-% IV SOLN
2.0000 g | INTRAVENOUS | Status: DC
  Filled 2019-04-21: qty 100

## 2019-04-21 MED ORDER — FENTANYL CITRATE (PF) 250 MCG/5ML IJ SOLN
INTRAMUSCULAR | Status: AC
  Filled 2019-04-21: qty 5

## 2019-04-21 MED ORDER — HEPARIN SOD (PORK) LOCK FLUSH 100 UNIT/ML IV SOLN
INTRAVENOUS | Status: DC | PRN
  Administered 2019-04-21: 500 [IU] via INTRAVENOUS

## 2019-04-21 MED ORDER — MIDAZOLAM HCL 2 MG/2ML IJ SOLN
INTRAMUSCULAR | Status: AC
  Filled 2019-04-21: qty 2

## 2019-04-21 MED ORDER — FENTANYL CITRATE (PF) 100 MCG/2ML IJ SOLN
INTRAMUSCULAR | Status: DC | PRN
  Administered 2019-04-21 (×2): 50 ug via INTRAVENOUS

## 2019-04-21 MED ORDER — LIDOCAINE-EPINEPHRINE 1 %-1:100000 IJ SOLN
INTRAMUSCULAR | Status: AC
  Filled 2019-04-21: qty 1

## 2019-04-21 MED ORDER — SODIUM CHLORIDE 0.9 % IV SOLN
INTRAVENOUS | Status: DC | PRN
  Administered 2019-04-21: 500 mL

## 2019-04-21 MED ORDER — BUPIVACAINE HCL (PF) 0.25 % IJ SOLN
INTRAMUSCULAR | Status: AC
  Filled 2019-04-21: qty 30

## 2019-04-21 MED ORDER — ACETAMINOPHEN 500 MG PO TABS
1000.0000 mg | ORAL_TABLET | ORAL | Status: AC
  Administered 2019-04-21: 1000 mg via ORAL
  Filled 2019-04-21: qty 2

## 2019-04-21 MED ORDER — LIDOCAINE-EPINEPHRINE 1 %-1:100000 IJ SOLN
INTRAMUSCULAR | Status: DC | PRN
  Administered 2019-04-21: 5 mL

## 2019-04-21 MED ORDER — HEPARIN SOD (PORK) LOCK FLUSH 100 UNIT/ML IV SOLN
INTRAVENOUS | Status: AC
  Filled 2019-04-21: qty 5

## 2019-04-21 MED ORDER — ONDANSETRON HCL 4 MG/2ML IJ SOLN
INTRAMUSCULAR | Status: DC | PRN
  Administered 2019-04-21: 4 mg via INTRAVENOUS

## 2019-04-21 MED ORDER — PROPOFOL 10 MG/ML IV BOLUS
INTRAVENOUS | Status: AC
  Filled 2019-04-21: qty 20

## 2019-04-21 MED ORDER — SODIUM CHLORIDE 0.9 % IV SOLN
INTRAVENOUS | Status: AC
  Filled 2019-04-21: qty 1.2

## 2019-04-21 MED ORDER — DEXAMETHASONE SODIUM PHOSPHATE 10 MG/ML IJ SOLN
INTRAMUSCULAR | Status: DC | PRN
  Administered 2019-04-21: 5 mg via INTRAVENOUS

## 2019-04-21 MED ORDER — FENTANYL CITRATE (PF) 100 MCG/2ML IJ SOLN: 25.0000 ug | INTRAMUSCULAR | Status: DC | PRN

## 2019-04-21 MED ORDER — LACTATED RINGERS IV SOLN
INTRAVENOUS | Status: DC
  Administered 2019-04-21: 10:00:00 via INTRAVENOUS

## 2019-04-21 SURGICAL SUPPLY — 37 items
ADH SKN CLS APL DERMABOND .7 (GAUZE/BANDAGES/DRESSINGS) ×1
APL PRP STRL LF DISP 70% ISPRP (MISCELLANEOUS) ×1
BAG DECANTER FOR FLEXI CONT (MISCELLANEOUS) ×2 IMPLANT
BLADE HEX COATED 2.75 (ELECTRODE) ×2 IMPLANT
BLADE SURG 15 STRL LF DISP TIS (BLADE) ×1 IMPLANT
BLADE SURG 15 STRL SS (BLADE) ×2
BLADE SURG SZ11 CARB STEEL (BLADE) ×2 IMPLANT
CHLORAPREP W/TINT 26 (MISCELLANEOUS) ×2 IMPLANT
COVER SURGICAL LIGHT HANDLE (MISCELLANEOUS) ×2 IMPLANT
COVER WAND RF STERILE (DRAPES) IMPLANT
DECANTER SPIKE VIAL GLASS SM (MISCELLANEOUS) ×2 IMPLANT
DERMABOND ADVANCED (GAUZE/BANDAGES/DRESSINGS) ×1
DERMABOND ADVANCED .7 DNX12 (GAUZE/BANDAGES/DRESSINGS) ×1 IMPLANT
DRAPE C-ARM 42X120 X-RAY (DRAPES) ×2 IMPLANT
DRAPE LAPAROTOMY TRNSV 102X78 (DRAPES) ×2 IMPLANT
DRAPE UTILITY XL STRL (DRAPES) ×2 IMPLANT
ELECT PENCIL ROCKER SW 15FT (MISCELLANEOUS) ×2 IMPLANT
ELECT REM PT RETURN 15FT ADLT (MISCELLANEOUS) ×2 IMPLANT
GAUZE 4X4 16PLY RFD (DISPOSABLE) ×2 IMPLANT
GLOVE BIO SURGEON STRL SZ 6 (GLOVE) ×2 IMPLANT
GLOVE INDICATOR 6.5 STRL GRN (GLOVE) ×2 IMPLANT
GOWN STRL REUS W/TWL 2XL LVL3 (GOWN DISPOSABLE) ×2 IMPLANT
GOWN STRL REUS W/TWL XL LVL3 (GOWN DISPOSABLE) ×2 IMPLANT
KIT BASIN OR (CUSTOM PROCEDURE TRAY) ×2 IMPLANT
KIT PORT POWER 8FR ISP CVUE (Port) ×1 IMPLANT
KIT TURNOVER KIT A (KITS) IMPLANT
NEEDLE HYPO 22GX1.5 SAFETY (NEEDLE) ×2 IMPLANT
PACK BASIC VI WITH GOWN DISP (CUSTOM PROCEDURE TRAY) ×2 IMPLANT
SUT MNCRL AB 4-0 PS2 18 (SUTURE) ×2 IMPLANT
SUT PROLENE 2 0 SH DA (SUTURE) ×4 IMPLANT
SUT VIC AB 3-0 SH 27 (SUTURE) ×2
SUT VIC AB 3-0 SH 27X BRD (SUTURE) ×1 IMPLANT
SYR 10ML LL (SYRINGE) ×2 IMPLANT
SYR CONTROL 10ML LL (SYRINGE) ×2 IMPLANT
TOWEL OR 17X26 10 PK STRL BLUE (TOWEL DISPOSABLE) ×2 IMPLANT
TOWEL OR NON WOVEN STRL DISP B (DISPOSABLE) ×2 IMPLANT
YANKAUER SUCT BULB TIP 10FT TU (MISCELLANEOUS) IMPLANT

## 2019-04-21 NOTE — Op Note (Signed)
PREOPERATIVE DIAGNOSIS:  Adenocarcinoma of the pancreatic head, pT3N1Mx     POSTOPERATIVE DIAGNOSIS:  Same     PROCEDURE: Left subclavian port placement, Bard   Power Port, MRI safe, 8-French.      SURGEON:  Stark Klein, MD      ANESTHESIA:  General   FINDINGS:  Good venous return, easy flush, and tip of the catheter and   SVC 23.5 cm.      SPECIMEN:  None.      ESTIMATED BLOOD LOSS:  Minimal.      COMPLICATIONS:  None known.      PROCEDURE:  Pt was identified in the holding area and taken to   the operating room, where patient was placed supine on the operating room   table.  General anesthesia was induced.  Patient's arms were tucked and the upper   chest and neck were prepped and draped in sterile fashion.  Time-out was   performed according to the surgical safety check list.  When all was   correct, we continued.   Local anesthetic was administered over this   area at the angle of the clavicle.  The vein was accessed with 1 pass(es) of the needle. There was good venous return and the wire passed easily with no ectopy.   Fluoroscopy was used to confirm that the wire was in the vena cava.      The patient was placed back level and the area for the pocket was anethetized   with local anesthetic.  A 3-cm transverse incision was made with a #15   blade.  Cautery was used to divide the subcutaneous tissues down to the   pectoralis muscle.  An Army-Navy retractor was used to elevate the skin   while a pocket was created on top of the pectoralis fascia.  The port   was placed into the pocket to confirm that it was of adequate size.  The   catheter was preattached to the port.  The port was then secured to the   pectoralis fascia with four 2-0 Prolene sutures.  These were clamped and   not tied down yet.    The catheter was tunneled through to the wire exit   site.  The catheter was placed along the wire to determine what length it should be to be in the SVC.  The catheter was cut  at 23.5 cm.  The tunneler sheath and dilator were passed over the wire and the dilator and wire were removed.  The catheter was advanced through the tunneler sheath and the tunneler sheath was pulled away.  Care was taken to keep the catheter in the tunneler sheath as this occurred. This was advanced and the tunneler sheath was removed.  There was good venous   return and easy flush of the catheter.  The Prolene sutures were tied   down to the pectoral fascia.  The skin was reapproximated using 3-0   Vicryl interrupted deep dermal sutures.    Fluoroscopy was used to re-confirm good position of the catheter.  The skin   was then closed using 4-0 Monocryl in a subcuticular fashion.  The port was flushed with concentrated heparin flush as well.  The wounds were then cleaned, dried, and dressed with Dermabond.  The patient was awakened from anesthesia and taken to the PACU in stable condition.  Needle, sponge, and instrument counts were correct.               Stark Klein,  MD

## 2019-04-21 NOTE — Anesthesia Procedure Notes (Signed)
Procedure Name: LMA Insertion Date/Time: 04/21/2019 11:37 AM Performed by: Imagene Riches, CRNA Pre-anesthesia Checklist: Patient identified, Emergency Drugs available, Suction available and Patient being monitored Patient Re-evaluated:Patient Re-evaluated prior to induction Oxygen Delivery Method: Circle System Utilized Preoxygenation: Pre-oxygenation with 100% oxygen Induction Type: IV induction Ventilation: Mask ventilation without difficulty LMA: LMA inserted LMA Size: 4.0 Number of attempts: 1 Airway Equipment and Method: Bite block Placement Confirmation: positive ETCO2 Tube secured with: Tape Dental Injury: Teeth and Oropharynx as per pre-operative assessment

## 2019-04-21 NOTE — Anesthesia Preprocedure Evaluation (Addendum)
Anesthesia Evaluation  Patient identified by MRN, date of birth, ID band Patient awake    Reviewed: Allergy & Precautions, NPO status , Patient's Chart, lab work & pertinent test results  History of Anesthesia Complications (+) PONV and history of anesthetic complications  Airway Mallampati: II  TM Distance: >3 FB Neck ROM: Full    Dental no notable dental hx. (+) Teeth Intact, Dental Advisory Given   Pulmonary neg pulmonary ROS,    Pulmonary exam normal breath sounds clear to auscultation       Cardiovascular hypertension, Pt. on medications negative cardio ROS Normal cardiovascular exam Rhythm:Regular Rate:Normal     Neuro/Psych negative neurological ROS  negative psych ROS   GI/Hepatic Neg liver ROS, GERD  Medicated and Controlled,  Endo/Other  negative endocrine ROSdiabetes, Insulin Dependent, Oral Hypoglycemic Agents  Renal/GU negative Renal ROS  negative genitourinary   Musculoskeletal negative musculoskeletal ROS (+)   Abdominal   Peds  Hematology  (+) Blood dyscrasia (Hgb 9.6), anemia ,   Anesthesia Other Findings   Reproductive/Obstetrics                            Anesthesia Physical Anesthesia Plan  ASA: III  Anesthesia Plan: General   Post-op Pain Management:    Induction: Intravenous  PONV Risk Score and Plan: Ondansetron, Dexamethasone and Midazolam  Airway Management Planned: LMA  Additional Equipment:   Intra-op Plan:   Post-operative Plan: Extubation in OR  Informed Consent: I have reviewed the patients History and Physical, chart, labs and discussed the procedure including the risks, benefits and alternatives for the proposed anesthesia with the patient or authorized representative who has indicated his/her understanding and acceptance.     Dental advisory given  Plan Discussed with: CRNA  Anesthesia Plan Comments:         Anesthesia Quick  Evaluation

## 2019-04-21 NOTE — Transfer of Care (Signed)
Immediate Anesthesia Transfer of Care Note  Patient: Alyssa Ross  Procedure(s) Performed: INSERTION PORT-A-CATH (Left Chest)  Patient Location: PACU  Anesthesia Type:General  Level of Consciousness: awake, alert  and oriented  Airway & Oxygen Therapy: Patient Spontanous Breathing and Patient connected to nasal cannula oxygen  Post-op Assessment: Report given to RN and Post -op Vital signs reviewed and stable  Post vital signs: Reviewed and stable  Last Vitals:  Vitals Value Taken Time  BP 130/90 04/21/19 1224  Temp    Pulse 98 04/21/19 1227  Resp 14 04/21/19 1227  SpO2 100 % 04/21/19 1227  Vitals shown include unvalidated device data.  Last Pain:  Vitals:   04/21/19 0911  TempSrc: Oral  PainSc: 0-No pain      Patients Stated Pain Goal: 0 (99/71/82 0990)  Complications: No apparent anesthesia complications

## 2019-04-21 NOTE — Anesthesia Postprocedure Evaluation (Signed)
Anesthesia Post Note  Patient: Lurlene Ronda  Procedure(s) Performed: INSERTION PORT-A-CATH (Left Chest)     Patient location during evaluation: PACU Anesthesia Type: General Level of consciousness: awake and alert Pain management: pain level controlled Vital Signs Assessment: post-procedure vital signs reviewed and stable Respiratory status: spontaneous breathing, nonlabored ventilation, respiratory function stable and patient connected to nasal cannula oxygen Cardiovascular status: blood pressure returned to baseline and stable Postop Assessment: no apparent nausea or vomiting Anesthetic complications: no    Last Vitals:  Vitals:   04/21/19 1239 04/21/19 1255  BP: (!) 135/92 124/86  Pulse: 87 88  Resp: 17 15  Temp:  (!) 36.4 C  SpO2: 97% 97%    Last Pain:  Vitals:   04/21/19 1255  TempSrc:   PainSc: 0-No pain                 Yelina Sarratt L Cailynn Bodnar

## 2019-04-21 NOTE — Discharge Instructions (Signed)
[facility] °Office Phone Number [phone] ° ° POST OP INSTRUCTIONS ° °Always review your discharge instruction sheet given to you by the facility where your surgery was performed. ° °IF YOU HAVE DISABILITY OR FAMILY LEAVE FORMS, YOU MUST BRING THEM TO THE OFFICE FOR PROCESSING.  DO NOT GIVE THEM TO YOUR DOCTOR. ° °1. A prescription for pain medication may be given to you upon discharge.  Take your pain medication as prescribed, if needed.  If narcotic pain medicine is not needed, then you may take acetaminophen (Tylenol) or ibuprofen (Advil) as needed. °2. Take your usually prescribed medications unless otherwise directed °3. If you need a refill on your pain medication, please contact your pharmacy.  They will contact our office to request authorization.  Prescriptions will not be filled after 5pm or on week-ends. °4. You should eat very light the first 24 hours after surgery, such as soup, crackers, pudding, etc.  Resume your normal diet the day after surgery °5. It is common to experience some constipation if taking pain medication after surgery.  Increasing fluid intake and taking a stool softener will usually help or prevent this problem from occurring.  A mild laxative (Milk of Magnesia or Miralax) should be taken according to package directions if there are no bowel movements after 48 hours. °6. You may shower in 48 hours.  The surgical glue will flake off in 2-3 weeks.   °7. ACTIVITIES:  No strenuous activity or heavy lifting for 1 week.   °a. You may drive when you no longer are taking prescription pain medication, you can comfortably wear a seatbelt, and you can safely maneuver your car and apply brakes. °b. RETURN TO WORK:  __________to be determined._______________ °You should see your doctor in the office for a follow-up appointment approximately three-four weeks after your surgery.   ° °WHEN TO CALL YOUR DOCTOR: °1. Fever over 101.0 °2. Nausea and/or vomiting. °3. Extreme swelling  or bruising. °4. Continued bleeding from incision. °5. Increased pain, redness, or drainage from the incision. ° °The clinic staff is available to answer your questions during regular business hours.  Please don’t hesitate to call and ask to speak to one of the nurses for clinical concerns.  If you have a medical emergency, go to the nearest emergency room or call 911.  A surgeon from [facility] is always on call at the hospital. ° °For further questions, please visit [email or web address]  ° °

## 2019-04-21 NOTE — Interval H&P Note (Signed)
History and Physical Interval Note:  04/21/2019 11:08 AM  Alyssa Ross  has presented today for surgery, with the diagnosis of pancreatic cancer.  The various methods of treatment have been discussed with the patient and family. After consideration of risks, benefits and other options for treatment, the patient has consented to  Procedure(s): INSERTION PORT-A-CATH WITH POSSIBLE ULTRASOUND (N/A) as a surgical intervention.  The patient's history has been reviewed, patient examined, no change in status, stable for surgery.  I have reviewed the patient's chart and labs.  Questions were answered to the patient's satisfaction.     Stark Klein

## 2019-04-22 ENCOUNTER — Other Ambulatory Visit: Payer: Self-pay

## 2019-04-22 ENCOUNTER — Encounter (HOSPITAL_COMMUNITY): Payer: Self-pay | Admitting: General Surgery

## 2019-04-22 ENCOUNTER — Inpatient Hospital Stay: Payer: BC Managed Care – PPO | Attending: Genetic Counselor | Admitting: General Practice

## 2019-04-22 DIAGNOSIS — Z79899 Other long term (current) drug therapy: Secondary | ICD-10-CM | POA: Insufficient documentation

## 2019-04-22 DIAGNOSIS — Z800 Family history of malignant neoplasm of digestive organs: Secondary | ICD-10-CM | POA: Insufficient documentation

## 2019-04-22 DIAGNOSIS — R918 Other nonspecific abnormal finding of lung field: Secondary | ICD-10-CM | POA: Insufficient documentation

## 2019-04-22 DIAGNOSIS — I10 Essential (primary) hypertension: Secondary | ICD-10-CM | POA: Insufficient documentation

## 2019-04-22 DIAGNOSIS — E119 Type 2 diabetes mellitus without complications: Secondary | ICD-10-CM | POA: Insufficient documentation

## 2019-04-22 DIAGNOSIS — Z905 Acquired absence of kidney: Secondary | ICD-10-CM | POA: Insufficient documentation

## 2019-04-22 DIAGNOSIS — C250 Malignant neoplasm of head of pancreas: Secondary | ICD-10-CM | POA: Insufficient documentation

## 2019-04-22 DIAGNOSIS — C7A8 Other malignant neuroendocrine tumors: Secondary | ICD-10-CM | POA: Insufficient documentation

## 2019-04-22 DIAGNOSIS — Z85528 Personal history of other malignant neoplasm of kidney: Secondary | ICD-10-CM | POA: Insufficient documentation

## 2019-04-22 NOTE — Progress Notes (Signed)
Hiram CSW Progress Notes  Met w patient in conference room at [facility].  Visit was originally scheduled as phone visit; however, transitioned to in person at patient request.  Discussed adjustment to new treatment regimen.  Is working on maintaining fitness and positive attitude as she begins chemotherapy.  Aware of side effects and somewhat apprehensive.  Wants to find others diagnosed w similar cancer/treatment regimen so she can find positive role models for herself.  Provided information from [facility], [place].  As she intends to continue working during chemo, CSW also gave information from [employer] and [employer].  Patient's mother will be moving into SNF for rehab and possible long term placement.  Patient's sister remains at home but needs caregivers with her due to her developmental disability.  Working on balancing needs of family vs her own needs as she starts chemotherapy.  Linked w [employer], a cancer wellness program, for information on their wellness activities and virtual retreats.  Edwyna Shell, LCSW Clinical Social Worker Phone:  [phone] Cell:  [phone]

## 2019-04-25 ENCOUNTER — Other Ambulatory Visit: Payer: Self-pay | Admitting: Oncology

## 2019-04-26 ENCOUNTER — Telehealth: Payer: Self-pay | Admitting: *Deleted

## 2019-04-26 MED ORDER — PROCHLORPERAZINE MALEATE 10 MG PO TABS: 10.0000 mg | ORAL_TABLET | Freq: Four times a day (QID) | ORAL | 1 refills | Status: DC | PRN

## 2019-04-26 MED ORDER — LIDOCAINE-PRILOCAINE 2.5-2.5 % EX CREA: 1.0000 "application " | TOPICAL_CREAM | CUTANEOUS | 5 refills | Status: DC

## 2019-04-26 MED ORDER — ONDANSETRON HCL 8 MG PO TABS: 8.0000 mg | ORAL_TABLET | Freq: Three times a day (TID) | ORAL | 0 refills | Status: DC | PRN

## 2019-04-26 NOTE — Telephone Encounter (Signed)
Called to request anti-emetic and EMLA cream be called in. Confirmed w/her that she is no longer taking Reglan (not needed). Informed her that she is not to take the new antiemetic and reglan. She understands and agrees.

## 2019-04-27 ENCOUNTER — Other Ambulatory Visit: Payer: Self-pay

## 2019-04-27 ENCOUNTER — Inpatient Hospital Stay: Payer: BC Managed Care – PPO

## 2019-04-27 ENCOUNTER — Inpatient Hospital Stay (HOSPITAL_BASED_OUTPATIENT_CLINIC_OR_DEPARTMENT_OTHER): Payer: BC Managed Care – PPO | Admitting: Nurse Practitioner

## 2019-04-27 ENCOUNTER — Inpatient Hospital Stay: Payer: BC Managed Care – PPO | Admitting: Nutrition

## 2019-04-27 ENCOUNTER — Encounter: Payer: Self-pay | Admitting: Nurse Practitioner

## 2019-04-27 VITALS — BP 159/103 | HR 94 | Temp 98.3°F | Resp 17 | Ht 68.0 in | Wt 199.2 lb

## 2019-04-27 VITALS — BP 155/95 | HR 80

## 2019-04-27 DIAGNOSIS — E119 Type 2 diabetes mellitus without complications: Secondary | ICD-10-CM | POA: Diagnosis not present

## 2019-04-27 DIAGNOSIS — I10 Essential (primary) hypertension: Secondary | ICD-10-CM | POA: Diagnosis not present

## 2019-04-27 DIAGNOSIS — Z905 Acquired absence of kidney: Secondary | ICD-10-CM | POA: Diagnosis not present

## 2019-04-27 DIAGNOSIS — Z800 Family history of malignant neoplasm of digestive organs: Secondary | ICD-10-CM | POA: Diagnosis not present

## 2019-04-27 DIAGNOSIS — Z95828 Presence of other vascular implants and grafts: Secondary | ICD-10-CM | POA: Insufficient documentation

## 2019-04-27 DIAGNOSIS — C250 Malignant neoplasm of head of pancreas: Secondary | ICD-10-CM

## 2019-04-27 DIAGNOSIS — Z85528 Personal history of other malignant neoplasm of kidney: Secondary | ICD-10-CM | POA: Diagnosis not present

## 2019-04-27 DIAGNOSIS — R918 Other nonspecific abnormal finding of lung field: Secondary | ICD-10-CM | POA: Diagnosis not present

## 2019-04-27 DIAGNOSIS — Z79899 Other long term (current) drug therapy: Secondary | ICD-10-CM | POA: Diagnosis not present

## 2019-04-27 DIAGNOSIS — C7A8 Other malignant neuroendocrine tumors: Secondary | ICD-10-CM | POA: Diagnosis not present

## 2019-04-27 DIAGNOSIS — C187 Malignant neoplasm of sigmoid colon: Secondary | ICD-10-CM | POA: Diagnosis not present

## 2019-04-27 LAB — CMP (CANCER CENTER ONLY)
ALT: 110 U/L — ABNORMAL HIGH (ref 0–44)
AST: 90 U/L — ABNORMAL HIGH (ref 15–41)
Albumin: 3.4 g/dL — ABNORMAL LOW (ref 3.5–5.0)
Alkaline Phosphatase: 87 U/L (ref 38–126)
Anion gap: 14 (ref 5–15)
BUN: 12 mg/dL (ref 6–20)
CO2: 23 mmol/L (ref 22–32)
Calcium: 9.1 mg/dL (ref 8.9–10.3)
Chloride: 106 mmol/L (ref 98–111)
Creatinine: 1.1 mg/dL — ABNORMAL HIGH (ref 0.44–1.00)
GFR, Est AFR Am: >60 mL/min (ref >60)
GFR, Estimated: 57 mL/min — ABNORMAL LOW (ref >60)
Glucose, Bld: 213 mg/dL — ABNORMAL HIGH (ref 70–99)
Potassium: 3.9 mmol/L (ref 3.5–5.1)
Sodium: 143 mmol/L (ref 135–145)
Total Bilirubin: 0.4 mg/dL (ref 0.3–1.2)
Total Protein: 6.8 g/dL (ref 6.5–8.1)

## 2019-04-27 LAB — CBC WITH DIFFERENTIAL (CANCER CENTER ONLY)
Abs Immature Granulocytes: 0.01 10*3/uL (ref 0.00–0.07)
Basophils Absolute: 0 10*3/uL (ref 0.0–0.1)
Basophils Relative: 1 %
Eosinophils Absolute: 0.3 10*3/uL (ref 0.0–0.5)
Eosinophils Relative: 6 %
HCT: 33.4 % — ABNORMAL LOW (ref 36.0–46.0)
Hemoglobin: 10.3 g/dL — ABNORMAL LOW (ref 12.0–15.0)
Immature Granulocytes: 0 %
Lymphocytes Relative: 41 %
Lymphs Abs: 1.8 10*3/uL (ref 0.7–4.0)
MCH: 27.7 pg (ref 26.0–34.0)
MCHC: 30.8 g/dL (ref 30.0–36.0)
MCV: 89.8 fL (ref 80.0–100.0)
Monocytes Absolute: 0.3 10*3/uL (ref 0.1–1.0)
Monocytes Relative: 8 %
Neutro Abs: 2 10*3/uL (ref 1.7–7.7)
Neutrophils Relative %: 44 %
Platelet Count: 336 10*3/uL (ref 150–400)
RBC: 3.72 MIL/uL — ABNORMAL LOW (ref 3.87–5.11)
RDW: 15.4 % (ref 11.5–15.5)
WBC Count: 4.4 10*3/uL (ref 4.0–10.5)
nRBC: 0 % (ref 0.0–0.2)

## 2019-04-27 MED ORDER — SODIUM CHLORIDE 0.9% FLUSH
10.0000 mL | INTRAVENOUS | Status: DC | PRN
  Filled 2019-04-27: qty 10

## 2019-04-27 MED ORDER — PALONOSETRON HCL INJECTION 0.25 MG/5ML
0.2500 mg | Freq: Once | INTRAVENOUS | Status: AC
  Administered 2019-04-27: 0.25 mg via INTRAVENOUS

## 2019-04-27 MED ORDER — SODIUM CHLORIDE 0.9 % IV SOLN
Freq: Once | INTRAVENOUS | Status: AC
  Administered 2019-04-27: 12:00:00 via INTRAVENOUS
  Filled 2019-04-27: qty 5

## 2019-04-27 MED ORDER — DEXTROSE 5 % IV SOLN
Freq: Once | INTRAVENOUS | Status: AC
  Administered 2019-04-27: 15:00:00 via INTRAVENOUS
  Filled 2019-04-27: qty 250

## 2019-04-27 MED ORDER — SODIUM CHLORIDE 0.9 % IV SOLN
2000.0000 mg/m2 | INTRAVENOUS | Status: DC
  Administered 2019-04-27: 4100 mg via INTRAVENOUS
  Filled 2019-04-27: qty 82

## 2019-04-27 MED ORDER — LEUCOVORIN CALCIUM INJECTION 350 MG
850.0000 mg | Freq: Once | INTRAVENOUS | Status: AC
  Administered 2019-04-27: 850 mg via INTRAVENOUS
  Filled 2019-04-27: qty 42.5

## 2019-04-27 MED ORDER — SODIUM CHLORIDE 0.9% FLUSH
10.0000 mL | Freq: Once | INTRAVENOUS | Status: AC
  Administered 2019-04-27: 10:00:00 10 mL
  Filled 2019-04-27: qty 10

## 2019-04-27 MED ORDER — IRINOTECAN HCL CHEMO INJECTION 100 MG/5ML
150.0000 mg/m2 | Freq: Once | INTRAVENOUS | Status: AC
  Administered 2019-04-27: 300 mg via INTRAVENOUS
  Filled 2019-04-27: qty 15

## 2019-04-27 MED ORDER — OXALIPLATIN CHEMO INJECTION 100 MG/20ML
85.0000 mg/m2 | Freq: Once | INTRAVENOUS | Status: AC
  Administered 2019-04-27: 175 mg via INTRAVENOUS
  Filled 2019-04-27: qty 35

## 2019-04-27 MED ORDER — ATROPINE SULFATE 1 MG/ML IJ SOLN
0.5000 mg | Freq: Once | INTRAMUSCULAR | Status: AC | PRN
  Administered 2019-04-27: 0.5 mg via INTRAVENOUS

## 2019-04-27 MED ORDER — ATROPINE SULFATE 1 MG/ML IJ SOLN
INTRAMUSCULAR | Status: AC
  Filled 2019-04-27: qty 1

## 2019-04-27 MED ORDER — PALONOSETRON HCL INJECTION 0.25 MG/5ML
INTRAVENOUS | Status: AC
  Filled 2019-04-27: qty 5

## 2019-04-27 MED ORDER — DEXTROSE 5 % IV SOLN
Freq: Once | INTRAVENOUS | Status: AC
  Administered 2019-04-27: 12:00:00 via INTRAVENOUS
  Filled 2019-04-27: qty 250

## 2019-04-27 MED ORDER — HEPARIN SOD (PORK) LOCK FLUSH 100 UNIT/ML IV SOLN
500.0000 [IU] | Freq: Once | INTRAVENOUS | Status: DC | PRN
  Filled 2019-04-27: qty 5

## 2019-04-27 NOTE — Progress Notes (Signed)
  [facility] OFFICE PROGRESS NOTE   Diagnosis: Pancreas cancer  INTERVAL HISTORY:   Alyssa Ross returns as scheduled.  Overall she feels well.  No nausea or vomiting.  No diarrhea.  No abdominal pain.  Objective:  Vital signs in last 24 hours:  Blood pressure (!) 159/103, pulse 94, temperature 98.3 F (36.8 C), resp. rate 17, height 5\' 8"  (1.727 m), weight 199 lb 3.2 oz (90.4 kg), last menstrual period 09/17/2012, SpO2 100 %.    HEENT: No thrush or ulcers. GI: Abdomen soft and nontender.  No hepatomegaly.  Healed midline surgical scar. Vascular: No leg edema.  Calves soft and nontender. Skin: Palms without erythema. Port-A-Cath without erythema.   Lab Results:  Lab Results  Component Value Date   WBC 4.4 04/27/2019   HGB 10.3 (L) 04/27/2019   HCT 33.4 (L) 04/27/2019   MCV 89.8 04/27/2019   PLT 336 04/27/2019   NEUTROABS 2.0 04/27/2019    Imaging:  No results found.  Medications: I have reviewed the patient's current medications.  Assessment/Plan: 1. 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 Tumor invades the duodenal wall and vascular groove, resection margins negative, 4/34 lymph nodes positive ? 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 CTs 01/29/2019-ill-defined pancreas head mass, 5 pulmonary nodules-1 with a small amount of central cavitation, tumor abuts the left margin of the portal vein indistinct density surrounding, hepatic artery, complex cystic lesion of the right kidney, right adrenal mass-characterized as an adenoma on a [facility] MRI 12/21/2018 ? 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 Elevated preoperative CA 19-9 ? CT chest 04/16/2019-multiple bilateral pulmonary nodules, some with increased cavitation, stable in  size ? Cycle 1 FOLFIRINOX 04/27/2019  2. 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 Diabetes 4. Hypertension 5. Family history of pancreas cancer 6. Port-A-Cath placement, Dr. Barry Dienes, 04/21/2019  Disposition: Alyssa Ross appears stable.  She is scheduled to begin treatment today with FOLFIRINOX.  We again reviewed potential toxicities.  She agrees to proceed.  We reviewed the CBC from today.  Counts adequate to proceed with treatment.  She will return for lab, follow-up, cycle 2 FOLFIRINOX in 2 weeks.  She will contact the office in the interim with any problems.  Plan reviewed with Dr. Benay Spice.    Ned Card ANP/GNP-BC   04/27/2019  10:59 AM

## 2019-04-27 NOTE — Progress Notes (Signed)
Okay to treat with elevated LFT's per Dr. Benay Spice.

## 2019-04-27 NOTE — Progress Notes (Signed)
Nutrition follow-up completed with patient during infusion for pancreas cancer status post [facility] and partial nephrectomy. Patient's weight is stable at 194.7 pounds on November 11. Patient reports she is feeling better and has increased tolerance to some foods. She continues to feel afraid to try new foods.  Nutrition diagnosis: Unintended weight loss improved.  Intervention: Patient was educated to continue strategies for small frequent meals and snacks. Reviewed method of advancing diet. Encourage patient to contact staff for questions.  Monitoring, evaluation, goals: Patient will tolerate adequate calories and protein to maintain lean body mass.  Next visit: I will schedule as needed.  Patient has my contact information.  **Disclaimer: This note was dictated with voice recognition software. Similar sounding words can inadvertently be transcribed and this note may contain transcription errors which may not have been corrected upon publication of note.**

## 2019-04-27 NOTE — Patient Instructions (Signed)
[facility] Discharge Instructions for Patients Receiving Chemotherapy  Today you received the following chemotherapy agents: Oxaliplatin, leucovorin, Irinotecan, 5FU  To help prevent nausea and vomiting after your treatment, we encourage you to take your nausea medication as directed.   If you develop nausea and vomiting that is not controlled by your nausea medication, call the clinic.   BELOW ARE SYMPTOMS THAT SHOULD BE REPORTED IMMEDIATELY:  *FEVER GREATER THAN 100.5 F  *CHILLS WITH OR WITHOUT FEVER  NAUSEA AND VOMITING THAT IS NOT CONTROLLED WITH YOUR NAUSEA MEDICATION  *UNUSUAL SHORTNESS OF BREATH  *UNUSUAL BRUISING OR BLEEDING  TENDERNESS IN MOUTH AND THROAT WITH OR WITHOUT PRESENCE OF ULCERS  *URINARY PROBLEMS  *BOWEL PROBLEMS  UNUSUAL RASH Items with * indicate a potential emergency and should be followed up as soon as possible.  Feel free to call the clinic should you have any questions or concerns. The clinic phone number is [phone].  Please show the [facility] at check-in to the Emergency Department and triage nurse.  Oxaliplatin Injection What is this medicine? OXALIPLATIN (ox AL i PLA tin) is a chemotherapy drug. It targets fast dividing cells, like cancer cells, and causes these cells to die. This medicine is used to treat cancers of the colon and rectum, and many other cancers. This medicine may be used for other purposes; ask your health care provider or pharmacist if you have questions. COMMON BRAND NAME(S): Eloxatin What should I tell my health care provider before I take this medicine? They need to know if you have any of these conditions:  kidney disease  an unusual or allergic reaction to oxaliplatin, other chemotherapy, other medicines, foods, dyes, or preservatives  pregnant or trying to get pregnant  breast-feeding How should I use this medicine? This drug is given as an infusion into a vein. It is administered in a  hospital or clinic by a specially trained health care professional. Talk to your pediatrician regarding the use of this medicine in children. Special care may be needed. Overdosage: If you think you have taken too much of this medicine contact a poison control center or emergency room at once. NOTE: This medicine is only for you. Do not share this medicine with others. What if I miss a dose? It is important not to miss a dose. Call your doctor or health care professional if you are unable to keep an appointment. What may interact with this medicine?  medicines to increase blood counts like filgrastim, pegfilgrastim, sargramostim  probenecid  some antibiotics like amikacin, gentamicin, neomycin, polymyxin B, streptomycin, tobramycin  zalcitabine Talk to your doctor or health care professional before taking any of these medicines:  acetaminophen  aspirin  ibuprofen  ketoprofen  naproxen This list may not describe all possible interactions. Give your health care provider a list of all the medicines, herbs, non-prescription drugs, or dietary supplements you use. Also tell them if you smoke, drink alcohol, or use illegal drugs. Some items may interact with your medicine. What should I watch for while using this medicine? Your condition will be monitored carefully while you are receiving this medicine. You will need important blood work done while you are taking this medicine. This medicine can make you more sensitive to cold. Do not drink cold drinks or use ice. Cover exposed skin before coming in contact with cold temperatures or cold objects. When out in cold weather wear warm clothing and cover your mouth and nose to warm the air that goes  into your lungs. Tell your doctor if you get sensitive to the cold. This drug may make you feel generally unwell. This is not uncommon, as chemotherapy can affect healthy cells as well as cancer cells. Report any side effects. Continue your course of  treatment even though you feel ill unless your doctor tells you to stop. In some cases, you may be given additional medicines to help with side effects. Follow all directions for their use. Call your doctor or health care professional for advice if you get a fever, chills or sore throat, or other symptoms of a cold or flu. Do not treat yourself. This drug decreases your body's ability to fight infections. Try to avoid being around people who are sick. This medicine may increase your risk to bruise or bleed. Call your doctor or health care professional if you notice any unusual bleeding. Be careful brushing and flossing your teeth or using a toothpick because you may get an infection or bleed more easily. If you have any dental work done, tell your dentist you are receiving this medicine. Avoid taking products that contain aspirin, acetaminophen, ibuprofen, naproxen, or ketoprofen unless instructed by your doctor. These medicines may hide a fever. Do not become pregnant while taking this medicine. Women should inform their doctor if they wish to become pregnant or think they might be pregnant. There is a potential for serious side effects to an unborn child. Talk to your health care professional or pharmacist for more information. Do not breast-feed an infant while taking this medicine. Call your doctor or health care professional if you get diarrhea. Do not treat yourself. What side effects may I notice from receiving this medicine? Side effects that you should report to your doctor or health care professional as soon as possible:  allergic reactions like skin rash, itching or hives, swelling of the face, lips, or tongue  low blood counts - This drug may decrease the number of white blood cells, red blood cells and platelets. You may be at increased risk for infections and bleeding.  signs of infection - fever or chills, cough, sore throat, pain or difficulty passing urine  signs of decreased  platelets or bleeding - bruising, pinpoint red spots on the skin, black, tarry stools, nosebleeds  signs of decreased red blood cells - unusually weak or tired, fainting spells, lightheadedness  breathing problems  chest pain, pressure  cough  diarrhea  jaw tightness  mouth sores  nausea and vomiting  pain, swelling, redness or irritation at the injection site  pain, tingling, numbness in the hands or feet  problems with balance, talking, walking  redness, blistering, peeling or loosening of the skin, including inside the mouth  trouble passing urine or change in the amount of urine Side effects that usually do not require medical attention (report to your doctor or health care professional if they continue or are bothersome):  changes in vision  constipation  hair loss  loss of appetite  metallic taste in the mouth or changes in taste  stomach pain This list may not describe all possible side effects. Call your doctor for medical advice about side effects. You may report side effects to FDA at 1-800-FDA-1088. Where should I keep my medicine? This drug is given in a hospital or clinic and will not be stored at home. NOTE: This sheet is a summary. It may not cover all possible information. If you have questions about this medicine, talk to your doctor, pharmacist, or health care provider.  2020 Elsevier/Gold Standard (2007-12-22 17:22:47)  Leucovorin injection What is this medicine? LEUCOVORIN (loo koe VOR in) is used to prevent or treat the harmful effects of some medicines. This medicine is used to treat anemia caused by a low amount of folic acid in the body. It is also used with 5-fluorouracil (5-FU) to treat colon cancer. This medicine may be used for other purposes; ask your health care provider or pharmacist if you have questions. What should I tell my health care provider before I take this medicine? They need to know if you have any of these  conditions:  anemia from low levels of vitamin B-12 in the blood  an unusual or allergic reaction to leucovorin, folic acid, other medicines, foods, dyes, or preservatives  pregnant or trying to get pregnant  breast-feeding How should I use this medicine? This medicine is for injection into a muscle or into a vein. It is given by a health care professional in a hospital or clinic setting. Talk to your pediatrician regarding the use of this medicine in children. Special care may be needed. Overdosage: If you think you have taken too much of this medicine contact a poison control center or emergency room at once. NOTE: This medicine is only for you. Do not share this medicine with others. What if I miss a dose? This does not apply. What may interact with this medicine?  capecitabine  fluorouracil  phenobarbital  phenytoin  primidone  trimethoprim-sulfamethoxazole This list may not describe all possible interactions. Give your health care provider a list of all the medicines, herbs, non-prescription drugs, or dietary supplements you use. Also tell them if you smoke, drink alcohol, or use illegal drugs. Some items may interact with your medicine. What should I watch for while using this medicine? Your condition will be monitored carefully while you are receiving this medicine. This medicine may increase the side effects of 5-fluorouracil, 5-FU. Tell your doctor or health care professional if you have diarrhea or mouth sores that do not get better or that get worse. What side effects may I notice from receiving this medicine? Side effects that you should report to your doctor or health care professional as soon as possible:  allergic reactions like skin rash, itching or hives, swelling of the face, lips, or tongue  breathing problems  fever, infection  mouth sores  unusual bleeding or bruising  unusually weak or tired Side effects that usually do not require medical  attention (report to your doctor or health care professional if they continue or are bothersome):  constipation or diarrhea  loss of appetite  nausea, vomiting This list may not describe all possible side effects. Call your doctor for medical advice about side effects. You may report side effects to FDA at 1-800-FDA-1088. Where should I keep my medicine? This drug is given in a hospital or clinic and will not be stored at home. NOTE: This sheet is a summary. It may not cover all possible information. If you have questions about this medicine, talk to your doctor, pharmacist, or health care provider.  2020 Elsevier/Gold Standard (2007-12-01 16:50:29)  Irinotecan injection What is this medicine? IRINOTECAN (ir in oh TEE kan ) is a chemotherapy drug. It is used to treat colon and rectal cancer. This medicine may be used for other purposes; ask your health care provider or pharmacist if you have questions. COMMON BRAND NAME(S): Camptosar What should I tell my health care provider before I take this medicine? They need to know if  you have any of these conditions:  dehydration  diarrhea  infection (especially a virus infection such as chickenpox, cold sores, or herpes)  liver disease  low blood counts, like low white cell, platelet, or red cell counts  low levels of calcium, magnesium, or potassium in the blood  recent or ongoing radiation therapy  an unusual or allergic reaction to irinotecan, other medicines, foods, dyes, or preservatives  pregnant or trying to get pregnant  breast-feeding How should I use this medicine? This drug is given as an infusion into a vein. It is administered in a hospital or clinic by a specially trained health care professional. Talk to your pediatrician regarding the use of this medicine in children. Special care may be needed. Overdosage: If you think you have taken too much of this medicine contact a poison control center or emergency room at  once. NOTE: This medicine is only for you. Do not share this medicine with others. What if I miss a dose? It is important not to miss your dose. Call your doctor or health care professional if you are unable to keep an appointment. What may interact with this medicine? This medicine may interact with the following medications:  antiviral medicines for HIV or AIDS  certain antibiotics like rifampin or rifabutin  certain medicines for fungal infections like itraconazole, ketoconazole, posaconazole, and voriconazole  certain medicines for seizures like carbamazepine, phenobarbital, phenotoin  clarithromycin  gemfibrozil  nefazodone  St. John's Wort This list may not describe all possible interactions. Give your health care provider a list of all the medicines, herbs, non-prescription drugs, or dietary supplements you use. Also tell them if you smoke, drink alcohol, or use illegal drugs. Some items may interact with your medicine. What should I watch for while using this medicine? Your condition will be monitored carefully while you are receiving this medicine. You will need important blood work done while you are taking this medicine. This drug may make you feel generally unwell. This is not uncommon, as chemotherapy can affect healthy cells as well as cancer cells. Report any side effects. Continue your course of treatment even though you feel ill unless your doctor tells you to stop. In some cases, you may be given additional medicines to help with side effects. Follow all directions for their use. You may get drowsy or dizzy. Do not drive, use machinery, or do anything that needs mental alertness until you know how this medicine affects you. Do not stand or sit up quickly, especially if you are an older patient. This reduces the risk of dizzy or fainting spells. Call your health care professional for advice if you get a fever, chills, or sore throat, or other symptoms of a cold or flu. Do  not treat yourself. This medicine decreases your body's ability to fight infections. Try to avoid being around people who are sick. Avoid taking products that contain aspirin, acetaminophen, ibuprofen, naproxen, or ketoprofen unless instructed by your doctor. These medicines may hide a fever. This medicine may increase your risk to bruise or bleed. Call your doctor or health care professional if you notice any unusual bleeding. Be careful brushing and flossing your teeth or using a toothpick because you may get an infection or bleed more easily. If you have any dental work done, tell your dentist you are receiving this medicine. Do not become pregnant while taking this medicine or for 6 months after stopping it. Women should inform their health care professional if they wish to become  pregnant or think they might be pregnant. Men should not father a child while taking this medicine and for 3 months after stopping it. There is potential for serious side effects to an unborn child. Talk to your health care professional for more information. Do not breast-feed an infant while taking this medicine or for 7 days after stopping it. This medicine has caused ovarian failure in some women. This medicine may make it more difficult to get pregnant. Talk to your health care professional if you are concerned about your fertility. This medicine has caused decreased sperm counts in some men. This may make it more difficult to father a child. Talk to your health care professional if you are concerned about your fertility. What side effects may I notice from receiving this medicine? Side effects that you should report to your doctor or health care professional as soon as possible:  allergic reactions like skin rash, itching or hives, swelling of the face, lips, or tongue  chest pain  diarrhea  flushing, runny nose, sweating during infusion  low blood counts - this medicine may decrease the number of white blood  cells, red blood cells and platelets. You may be at increased risk for infections and bleeding.  nausea, vomiting  pain, swelling, warmth in the leg  signs of decreased platelets or bleeding - bruising, pinpoint red spots on the skin, black, tarry stools, blood in the urine  signs of infection - fever or chills, cough, sore throat, pain or difficulty passing urine  signs of decreased red blood cells - unusually weak or tired, fainting spells, lightheadedness Side effects that usually do not require medical attention (report to your doctor or health care professional if they continue or are bothersome):  constipation  hair loss  headache  loss of appetite  mouth sores  stomach pain This list may not describe all possible side effects. Call your doctor for medical advice about side effects. You may report side effects to FDA at 1-800-FDA-1088. Where should I keep my medicine? This drug is given in a hospital or clinic and will not be stored at home. NOTE: This sheet is a summary. It may not cover all possible information. If you have questions about this medicine, talk to your doctor, pharmacist, or health care provider.  2020 Elsevier/Gold Standard (2018-07-17 10:09:17)  Fluorouracil, 5FU; Diclofenac topical cream What is this medicine? FLUOROURACIL; DICLOFENAC (flure oh YOOR a sil; dye KLOE fen ak) is a combination of a topical chemotherapy agent and non-steroidal anti-inflammatory drug (NSAID). It is used on the skin to treat skin cancer and skin conditions that could become cancer. This medicine may be used for other purposes; ask your health care provider or pharmacist if you have questions. COMMON BRAND NAME(S): FLUORAC What should I tell my health care provider before I take this medicine? They need to know if you have any of these conditions:  bleeding problems  cigarette smoker  DPD enzyme deficiency  heart disease  high blood pressure  if you frequently drink  alcohol containing drinks  kidney disease  liver disease  open or infected skin  stomach problems  swelling or open sores at the treatment site  recent or planned coronary artery bypass graft (CABG) surgery  an unusual or allergic reaction to fluorouracil, diclofenac, aspirin, other NSAIDs, other medicines, foods, dyes, or preservatives  pregnant or trying to get pregnant  breast-feeding How should I use this medicine? This medicine is only for use on the skin. Follow the directions  on the prescription label. Wash hands before and after use. Wash affected area and gently pat dry. To apply this medicine use a cotton-tipped applicator, or use gloves if applying with fingertips. If applied with unprotected fingertips, it is very important to wash your hands well after you apply this medicine. Avoid applying to the eyes, nose, or mouth. Apply enough medicine to cover the affected area. You can cover the area with a light gauze dressing, but do not use tight or air-tight dressings. Finish the full course prescribed by your doctor or health care professional, even if you think your condition is better. Do not stop taking except on the advice of your doctor or health care professional. Talk to your pediatrician regarding the use of this medicine in children. Special care may be needed. Overdosage: If you think you have taken too much of this medicine contact a poison control center or emergency room at once. NOTE: This medicine is only for you. Do not share this medicine with others. What if I miss a dose? If you miss a dose, apply it as soon as you can. If it is almost time for your next dose, only use that dose. Do not apply extra doses. Contact your doctor or health care professional if you miss more than one dose. What may interact with this medicine? Interactions are not expected. Do not use any other skin products without telling your doctor or health care professional. This list may not  describe all possible interactions. Give your health care provider a list of all the medicines, herbs, non-prescription drugs, or dietary supplements you use. Also tell them if you smoke, drink alcohol, or use illegal drugs. Some items may interact with your medicine. What should I watch for while using this medicine? Visit your doctor or healthcare provider for checks on your progress. You will need to use this medicine for 2 to 6 weeks. This may be longer depending on the condition being treated. You may not see full healing for another 1 to 2 months after you stop using the medicine. This medicine may cause serious skin reactions. They can happen weeks to months after starting the medicine. Contact your healthcare provider right away if you notice fevers or flu-like symptoms with a rash. The rash may be red or purple and then turn into blisters or peeling of the skin. Or, you might notice a red rash with swelling of the face, lips or lymph nodes in your neck or under your arms. Treated areas of skin can look unsightly during and for several weeks after treatment with this medicine. This medicine can make you more sensitive to the sun. Keep out of the sun. If you cannot avoid being in the sun, wear protective clothing and use sunscreen. Do not use sun lamps or tanning beds/booths. If a pet comes in contact with the area where this medicine was applied to your skin or if it is ingested, they may have a serious risk of side effects. If accidental contact happens, the skin of the pet should be washed right away with soap and water. Contact your vet right away if your pet becomes exposed. Do not become pregnant while taking this medicine. Women should inform their doctor if they wish to become pregnant or think they might be pregnant. There is a potential for serious side effects to an unborn child. Talk to your healthcare provider or pharmacist for more information. What side effects may I notice from  receiving this medicine? Side  effects that you should report to your doctor or health care professional as soon as possible:  allergic reactions like skin rash, itching or hives, swelling of the face, lips, or tongue  black or bloody stools, blood in the urine or vomit  blurred vision  chest pain  difficulty breathing or wheezing  rash, fever, and swollen lymph nodes  redness, blistering, peeling or loosening of the skin, including inside the mouth  severe redness and swelling of normal skin  slurred speech or weakness on one side of the body  trouble passing urine or change in the amount of urine  unexplained weight gain or swelling  unusually weak or tired  yellowing of eyes or skin Side effects that usually do not require medical attention (report to your doctor or health care professional if they continue or are bothersome):  increased sensitivity of the skin to sun and ultraviolet light  pain and burning of the affected area  scaling or swelling of the affected area  skin rash, itching of the affected area  tenderness This list may not describe all possible side effects. Call your doctor for medical advice about side effects. You may report side effects to FDA at 1-800-FDA-1088. Where should I keep my medicine? Keep out of the reach of children and pets. Store at room temperature between 20 and 25 degrees C (68 and 77 degrees F). Throw away any unused medicine after the expiration date. NOTE: This sheet is a summary. It may not cover all possible information. If you have questions about this medicine, talk to your doctor, pharmacist, or health care provider.  2020 Elsevier/Gold Standard (2018-08-12 13:31:57)

## 2019-04-28 ENCOUNTER — Telehealth: Payer: Self-pay | Admitting: Oncology

## 2019-04-28 LAB — CANCER ANTIGEN 19-9: CA 19-9: 15 U/mL (ref 0–35)

## 2019-04-28 NOTE — Telephone Encounter (Signed)
Scheduled per los. Called and spoke with patient. Confirmed appt 

## 2019-04-29 ENCOUNTER — Other Ambulatory Visit: Payer: Self-pay

## 2019-04-29 ENCOUNTER — Inpatient Hospital Stay: Payer: BC Managed Care – PPO

## 2019-04-29 VITALS — BP 112/78 | HR 97 | Temp 98.2°F | Resp 18

## 2019-04-29 DIAGNOSIS — Z905 Acquired absence of kidney: Secondary | ICD-10-CM | POA: Diagnosis not present

## 2019-04-29 DIAGNOSIS — C7A8 Other malignant neuroendocrine tumors: Secondary | ICD-10-CM | POA: Diagnosis not present

## 2019-04-29 DIAGNOSIS — I10 Essential (primary) hypertension: Secondary | ICD-10-CM | POA: Diagnosis not present

## 2019-04-29 DIAGNOSIS — Z85528 Personal history of other malignant neoplasm of kidney: Secondary | ICD-10-CM | POA: Diagnosis not present

## 2019-04-29 DIAGNOSIS — Z800 Family history of malignant neoplasm of digestive organs: Secondary | ICD-10-CM | POA: Diagnosis not present

## 2019-04-29 DIAGNOSIS — E119 Type 2 diabetes mellitus without complications: Secondary | ICD-10-CM | POA: Diagnosis not present

## 2019-04-29 DIAGNOSIS — C250 Malignant neoplasm of head of pancreas: Secondary | ICD-10-CM | POA: Diagnosis not present

## 2019-04-29 DIAGNOSIS — Z79899 Other long term (current) drug therapy: Secondary | ICD-10-CM | POA: Diagnosis not present

## 2019-04-29 DIAGNOSIS — R918 Other nonspecific abnormal finding of lung field: Secondary | ICD-10-CM | POA: Diagnosis not present

## 2019-04-29 MED ORDER — SODIUM CHLORIDE 0.9% FLUSH
10.0000 mL | INTRAVENOUS | Status: DC | PRN
  Administered 2019-04-29: 10 mL
  Filled 2019-04-29: qty 10

## 2019-04-29 MED ORDER — HEPARIN SOD (PORK) LOCK FLUSH 100 UNIT/ML IV SOLN
500.0000 [IU] | Freq: Once | INTRAVENOUS | Status: AC | PRN
  Administered 2019-04-29: 500 [IU]
  Filled 2019-04-29: qty 5

## 2019-05-05 ENCOUNTER — Encounter: Payer: Self-pay | Admitting: *Deleted

## 2019-05-05 NOTE — Progress Notes (Signed)
Received faxed request for medical records and form for STD from [facility] to inbox for disability nurse to pick up.

## 2019-05-06 ENCOUNTER — Other Ambulatory Visit: Payer: Self-pay | Admitting: Endocrinology

## 2019-05-09 ENCOUNTER — Other Ambulatory Visit: Payer: Self-pay | Admitting: Oncology

## 2019-05-11 ENCOUNTER — Encounter (HOSPITAL_COMMUNITY): Payer: Self-pay

## 2019-05-11 ENCOUNTER — Other Ambulatory Visit: Payer: Self-pay

## 2019-05-11 ENCOUNTER — Inpatient Hospital Stay (HOSPITAL_BASED_OUTPATIENT_CLINIC_OR_DEPARTMENT_OTHER): Payer: BC Managed Care – PPO | Admitting: Oncology

## 2019-05-11 ENCOUNTER — Inpatient Hospital Stay (HOSPITAL_BASED_OUTPATIENT_CLINIC_OR_DEPARTMENT_OTHER): Payer: BC Managed Care – PPO | Admitting: Medical

## 2019-05-11 ENCOUNTER — Inpatient Hospital Stay: Payer: BC Managed Care – PPO | Attending: Genetic Counselor

## 2019-05-11 ENCOUNTER — Inpatient Hospital Stay: Payer: BC Managed Care – PPO

## 2019-05-11 VITALS — BP 135/89

## 2019-05-11 VITALS — BP 134/99 | HR 80 | Temp 97.4°F | Resp 18 | Ht 68.0 in | Wt 192.5 lb

## 2019-05-11 DIAGNOSIS — Z5111 Encounter for antineoplastic chemotherapy: Secondary | ICD-10-CM | POA: Insufficient documentation

## 2019-05-11 DIAGNOSIS — I10 Essential (primary) hypertension: Secondary | ICD-10-CM | POA: Insufficient documentation

## 2019-05-11 DIAGNOSIS — E119 Type 2 diabetes mellitus without complications: Secondary | ICD-10-CM | POA: Diagnosis not present

## 2019-05-11 DIAGNOSIS — C250 Malignant neoplasm of head of pancreas: Secondary | ICD-10-CM

## 2019-05-11 DIAGNOSIS — Z905 Acquired absence of kidney: Secondary | ICD-10-CM | POA: Insufficient documentation

## 2019-05-11 DIAGNOSIS — Z85528 Personal history of other malignant neoplasm of kidney: Secondary | ICD-10-CM | POA: Insufficient documentation

## 2019-05-11 DIAGNOSIS — C7A8 Other malignant neuroendocrine tumors: Secondary | ICD-10-CM

## 2019-05-11 DIAGNOSIS — Z95828 Presence of other vascular implants and grafts: Secondary | ICD-10-CM

## 2019-05-11 DIAGNOSIS — C187 Malignant neoplasm of sigmoid colon: Secondary | ICD-10-CM | POA: Diagnosis not present

## 2019-05-11 LAB — CBC WITH DIFFERENTIAL (CANCER CENTER ONLY)
Abs Immature Granulocytes: 0 10*3/uL (ref 0.00–0.07)
Basophils Absolute: 0 10*3/uL (ref 0.0–0.1)
Basophils Relative: 1 %
Eosinophils Absolute: 0.3 10*3/uL (ref 0.0–0.5)
Eosinophils Relative: 6 %
HCT: 31.6 % — ABNORMAL LOW (ref 36.0–46.0)
Hemoglobin: 9.9 g/dL — ABNORMAL LOW (ref 12.0–15.0)
Immature Granulocytes: 0 %
Lymphocytes Relative: 43 %
Lymphs Abs: 2.1 10*3/uL (ref 0.7–4.0)
MCH: 27.5 pg (ref 26.0–34.0)
MCHC: 31.3 g/dL (ref 30.0–36.0)
MCV: 87.8 fL (ref 80.0–100.0)
Monocytes Absolute: 0.4 10*3/uL (ref 0.1–1.0)
Monocytes Relative: 9 %
Neutro Abs: 2 10*3/uL (ref 1.7–7.7)
Neutrophils Relative %: 41 %
Platelet Count: 413 10*3/uL — ABNORMAL HIGH (ref 150–400)
RBC: 3.6 MIL/uL — ABNORMAL LOW (ref 3.87–5.11)
RDW: 15.1 % (ref 11.5–15.5)
WBC Count: 4.8 10*3/uL (ref 4.0–10.5)
nRBC: 0 % (ref 0.0–0.2)

## 2019-05-11 LAB — CMP (CANCER CENTER ONLY)
ALT: 60 U/L — ABNORMAL HIGH (ref 0–44)
AST: 34 U/L (ref 15–41)
Albumin: 3.6 g/dL (ref 3.5–5.0)
Alkaline Phosphatase: 68 U/L (ref 38–126)
Anion gap: 10 (ref 5–15)
BUN: 12 mg/dL (ref 6–20)
CO2: 26 mmol/L (ref 22–32)
Calcium: 9.5 mg/dL (ref 8.9–10.3)
Chloride: 108 mmol/L (ref 98–111)
Creatinine: 0.78 mg/dL (ref 0.44–1.00)
GFR, Est AFR Am: >60 mL/min (ref >60)
GFR, Estimated: >60 mL/min (ref >60)
Glucose, Bld: 92 mg/dL (ref 70–99)
Potassium: 3.6 mmol/L (ref 3.5–5.1)
Sodium: 144 mmol/L (ref 135–145)
Total Bilirubin: 0.3 mg/dL (ref 0.3–1.2)
Total Protein: 6.8 g/dL (ref 6.5–8.1)

## 2019-05-11 MED ORDER — IRINOTECAN HCL CHEMO INJECTION 100 MG/5ML
150.0000 mg/m2 | Freq: Once | INTRAVENOUS | Status: AC
  Administered 2019-05-11: 300 mg via INTRAVENOUS
  Filled 2019-05-11: qty 15

## 2019-05-11 MED ORDER — SODIUM CHLORIDE 0.9 % IV SOLN
2000.0000 mg/m2 | INTRAVENOUS | Status: DC
  Administered 2019-05-11: 4100 mg via INTRAVENOUS
  Filled 2019-05-11: qty 82

## 2019-05-11 MED ORDER — OXALIPLATIN CHEMO INJECTION 100 MG/20ML
85.0000 mg/m2 | Freq: Once | INTRAVENOUS | Status: AC
  Administered 2019-05-11: 175 mg via INTRAVENOUS
  Filled 2019-05-11: qty 35

## 2019-05-11 MED ORDER — SODIUM CHLORIDE 0.9 % IV SOLN
Freq: Once | INTRAVENOUS | Status: AC
  Administered 2019-05-11: 11:00:00 via INTRAVENOUS
  Filled 2019-05-11: qty 5

## 2019-05-11 MED ORDER — PALONOSETRON HCL INJECTION 0.25 MG/5ML
INTRAVENOUS | Status: AC
  Filled 2019-05-11: qty 5

## 2019-05-11 MED ORDER — ATROPINE SULFATE 1 MG/ML IJ SOLN
0.5000 mg | Freq: Once | INTRAMUSCULAR | Status: AC | PRN
  Administered 2019-05-11: 0.5 mg via INTRAVENOUS

## 2019-05-11 MED ORDER — DEXTROSE 5 % IV SOLN
Freq: Once | INTRAVENOUS | Status: AC
  Administered 2019-05-11: 11:00:00 via INTRAVENOUS
  Filled 2019-05-11: qty 250

## 2019-05-11 MED ORDER — LEUCOVORIN CALCIUM INJECTION 350 MG
400.0000 mg/m2 | Freq: Once | INTRAVENOUS | Status: AC
  Administered 2019-05-11: 824 mg via INTRAVENOUS
  Filled 2019-05-11: qty 41.2

## 2019-05-11 MED ORDER — SODIUM CHLORIDE 0.9% FLUSH
10.0000 mL | Freq: Once | INTRAVENOUS | Status: AC
  Administered 2019-05-11: 10 mL
  Filled 2019-05-11: qty 10

## 2019-05-11 MED ORDER — PALONOSETRON HCL INJECTION 0.25 MG/5ML
0.2500 mg | Freq: Once | INTRAVENOUS | Status: AC
  Administered 2019-05-11: 0.25 mg via INTRAVENOUS

## 2019-05-11 MED ORDER — ATROPINE SULFATE 1 MG/ML IJ SOLN
INTRAMUSCULAR | Status: AC
  Filled 2019-05-11: qty 1

## 2019-05-11 NOTE — Progress Notes (Signed)
  [facility] OFFICE PROGRESS NOTE   Diagnosis: Pancreas cancer  INTERVAL HISTORY:   Alyssa Ross completed cycle 1 FOLFIRINOX on 04/27/2019.  She reports cold sensitivity for several days following chemotherapy.  No other neuropathy symptoms.  No nausea/vomiting mouth sores, or diarrhea.  Good appetite.  Objective:  Vital signs in last 24 hours:  Blood pressure (!) 134/99, pulse 80, temperature (!) 97.4 F (36.3 C), temperature source Temporal, resp. rate 18, height 5\' 8"  (1.727 m), weight 192 lb 8 oz (87.3 kg), last menstrual period 09/17/2012, SpO2 100 %.    Limited physical examination secondary to distancing with the Covid pandemic GI: No hepatomegaly, no mass, nontender, healed midline incision Vascular: No leg edema  Skin: Palms without erythema  Portacath/PICC-without erythema  Lab Results:  Lab Results  Component Value Date   WBC 4.8 05/11/2019   HGB 9.9 (L) 05/11/2019   HCT 31.6 (L) 05/11/2019   MCV 87.8 05/11/2019   PLT 413 (H) 05/11/2019   NEUTROABS 2.0 05/11/2019    CMP  Lab Results  Component Value Date   NA 144 05/11/2019   K 3.6 05/11/2019   CL 108 05/11/2019   CO2 26 05/11/2019   GLUCOSE 92 05/11/2019   BUN 12 05/11/2019   CREATININE 0.78 05/11/2019   CALCIUM 9.5 05/11/2019   PROT 6.8 05/11/2019   ALBUMIN 3.6 05/11/2019   AST 34 05/11/2019   ALT 60 (H) 05/11/2019   ALKPHOS 68 05/11/2019   BILITOT 0.3 05/11/2019   GFRNONAA >60 05/11/2019   GFRAA >60 05/11/2019    Medications: I have reviewed the patient's current medications.   Assessment/Plan: 1. 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 Tumor invades the duodenal wall and vascular groove, resection margins negative, 4/34 lymph nodes positive ? 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 CTs 01/29/2019-ill-defined pancreas head mass, 5 pulmonary nodules-1 with a small amount of central  cavitation, tumor abuts the left margin of the portal vein indistinct density surrounding, hepatic artery, complex cystic lesion of the right kidney, right adrenal mass-characterized as an adenoma on a [facility] MRI 12/21/2018 ? 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 Elevated preoperative CA 19-9 ? CT chest 04/16/2019-multiple bilateral pulmonary nodules, some with increased cavitation, stable in size ? Cycle 1 FOLFIRINOX 04/27/2019 ? Cycle 2 FOLFIRINOX 05/11/2019  2. 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 Diabetes 4. Hypertension 5. Family history of pancreas cancer 6. Port-A-Cath placement, Dr. Barry Dienes, 04/21/2019    Disposition: Alyssa Ross tolerated the first cycle of FOLFIRINOX well.  To complete cycle 2 today.  She will return for an office visit and chemotherapy in 2 weeks.  The plan is to schedule a restaging chest CT after cycle 5.  [name], MD  05/11/2019  10:52 AM

## 2019-05-11 NOTE — Progress Notes (Signed)
Pt c/o "hand cramping" after Oxaliplatin infusion finished and Irinotecan/Leucovorin infusion had started. Sandi Mealy to tx area to assess pt. No new orders. Pt c/o "under eye twitching"  @1515 . Sandi Mealy PA notified, no new orders. Pt advised to call [facility] is symptoms worsen or new symptoms develop. Pt verbalized understanding.

## 2019-05-11 NOTE — Patient Instructions (Signed)
[facility] Discharge Instructions for Patients Receiving Chemotherapy  Today you received the following chemotherapy agents: Oxaliplatin, leucovorin, Irinotecan, 5FU  To help prevent nausea and vomiting after your treatment, we encourage you to take your nausea medication as directed.   If you develop nausea and vomiting that is not controlled by your nausea medication, call the clinic.   BELOW ARE SYMPTOMS THAT SHOULD BE REPORTED IMMEDIATELY:  *FEVER GREATER THAN 100.5 F  *CHILLS WITH OR WITHOUT FEVER  NAUSEA AND VOMITING THAT IS NOT CONTROLLED WITH YOUR NAUSEA MEDICATION  *UNUSUAL SHORTNESS OF BREATH  *UNUSUAL BRUISING OR BLEEDING  TENDERNESS IN MOUTH AND THROAT WITH OR WITHOUT PRESENCE OF ULCERS  *URINARY PROBLEMS  *BOWEL PROBLEMS  UNUSUAL RASH Items with * indicate a potential emergency and should be followed up as soon as possible.  Feel free to call the clinic should you have any questions or concerns. The clinic phone number is [phone].  Please show the [facility] at check-in to the Emergency Department and triage nurse.  Oxaliplatin Injection What is this medicine? OXALIPLATIN (ox AL i PLA tin) is a chemotherapy drug. It targets fast dividing cells, like cancer cells, and causes these cells to die. This medicine is used to treat cancers of the colon and rectum, and many other cancers. This medicine may be used for other purposes; ask your health care provider or pharmacist if you have questions. COMMON BRAND NAME(S): Eloxatin What should I tell my health care provider before I take this medicine? They need to know if you have any of these conditions:  kidney disease  an unusual or allergic reaction to oxaliplatin, other chemotherapy, other medicines, foods, dyes, or preservatives  pregnant or trying to get pregnant  breast-feeding How should I use this medicine? This drug is given as an infusion into a vein. It is administered in a  hospital or clinic by a specially trained health care professional. Talk to your pediatrician regarding the use of this medicine in children. Special care may be needed. Overdosage: If you think you have taken too much of this medicine contact a poison control center or emergency room at once. NOTE: This medicine is only for you. Do not share this medicine with others. What if I miss a dose? It is important not to miss a dose. Call your doctor or health care professional if you are unable to keep an appointment. What may interact with this medicine?  medicines to increase blood counts like filgrastim, pegfilgrastim, sargramostim  probenecid  some antibiotics like amikacin, gentamicin, neomycin, polymyxin B, streptomycin, tobramycin  zalcitabine Talk to your doctor or health care professional before taking any of these medicines:  acetaminophen  aspirin  ibuprofen  ketoprofen  naproxen This list may not describe all possible interactions. Give your health care provider a list of all the medicines, herbs, non-prescription drugs, or dietary supplements you use. Also tell them if you smoke, drink alcohol, or use illegal drugs. Some items may interact with your medicine. What should I watch for while using this medicine? Your condition will be monitored carefully while you are receiving this medicine. You will need important blood work done while you are taking this medicine. This medicine can make you more sensitive to cold. Do not drink cold drinks or use ice. Cover exposed skin before coming in contact with cold temperatures or cold objects. When out in cold weather wear warm clothing and cover your mouth and nose to warm the air that goes  into your lungs. Tell your doctor if you get sensitive to the cold. This drug may make you feel generally unwell. This is not uncommon, as chemotherapy can affect healthy cells as well as cancer cells. Report any side effects. Continue your course of  treatment even though you feel ill unless your doctor tells you to stop. In some cases, you may be given additional medicines to help with side effects. Follow all directions for their use. Call your doctor or health care professional for advice if you get a fever, chills or sore throat, or other symptoms of a cold or flu. Do not treat yourself. This drug decreases your body's ability to fight infections. Try to avoid being around people who are sick. This medicine may increase your risk to bruise or bleed. Call your doctor or health care professional if you notice any unusual bleeding. Be careful brushing and flossing your teeth or using a toothpick because you may get an infection or bleed more easily. If you have any dental work done, tell your dentist you are receiving this medicine. Avoid taking products that contain aspirin, acetaminophen, ibuprofen, naproxen, or ketoprofen unless instructed by your doctor. These medicines may hide a fever. Do not become pregnant while taking this medicine. Women should inform their doctor if they wish to become pregnant or think they might be pregnant. There is a potential for serious side effects to an unborn child. Talk to your health care professional or pharmacist for more information. Do not breast-feed an infant while taking this medicine. Call your doctor or health care professional if you get diarrhea. Do not treat yourself. What side effects may I notice from receiving this medicine? Side effects that you should report to your doctor or health care professional as soon as possible:  allergic reactions like skin rash, itching or hives, swelling of the face, lips, or tongue  low blood counts - This drug may decrease the number of white blood cells, red blood cells and platelets. You may be at increased risk for infections and bleeding.  signs of infection - fever or chills, cough, sore throat, pain or difficulty passing urine  signs of decreased  platelets or bleeding - bruising, pinpoint red spots on the skin, black, tarry stools, nosebleeds  signs of decreased red blood cells - unusually weak or tired, fainting spells, lightheadedness  breathing problems  chest pain, pressure  cough  diarrhea  jaw tightness  mouth sores  nausea and vomiting  pain, swelling, redness or irritation at the injection site  pain, tingling, numbness in the hands or feet  problems with balance, talking, walking  redness, blistering, peeling or loosening of the skin, including inside the mouth  trouble passing urine or change in the amount of urine Side effects that usually do not require medical attention (report to your doctor or health care professional if they continue or are bothersome):  changes in vision  constipation  hair loss  loss of appetite  metallic taste in the mouth or changes in taste  stomach pain This list may not describe all possible side effects. Call your doctor for medical advice about side effects. You may report side effects to FDA at 1-800-FDA-1088. Where should I keep my medicine? This drug is given in a hospital or clinic and will not be stored at home. NOTE: This sheet is a summary. It may not cover all possible information. If you have questions about this medicine, talk to your doctor, pharmacist, or health care provider.  2020 Elsevier/Gold Standard (2007-12-22 17:22:47)  Leucovorin injection What is this medicine? LEUCOVORIN (loo koe VOR in) is used to prevent or treat the harmful effects of some medicines. This medicine is used to treat anemia caused by a low amount of folic acid in the body. It is also used with 5-fluorouracil (5-FU) to treat colon cancer. This medicine may be used for other purposes; ask your health care provider or pharmacist if you have questions. What should I tell my health care provider before I take this medicine? They need to know if you have any of these  conditions:  anemia from low levels of vitamin B-12 in the blood  an unusual or allergic reaction to leucovorin, folic acid, other medicines, foods, dyes, or preservatives  pregnant or trying to get pregnant  breast-feeding How should I use this medicine? This medicine is for injection into a muscle or into a vein. It is given by a health care professional in a hospital or clinic setting. Talk to your pediatrician regarding the use of this medicine in children. Special care may be needed. Overdosage: If you think you have taken too much of this medicine contact a poison control center or emergency room at once. NOTE: This medicine is only for you. Do not share this medicine with others. What if I miss a dose? This does not apply. What may interact with this medicine?  capecitabine  fluorouracil  phenobarbital  phenytoin  primidone  trimethoprim-sulfamethoxazole This list may not describe all possible interactions. Give your health care provider a list of all the medicines, herbs, non-prescription drugs, or dietary supplements you use. Also tell them if you smoke, drink alcohol, or use illegal drugs. Some items may interact with your medicine. What should I watch for while using this medicine? Your condition will be monitored carefully while you are receiving this medicine. This medicine may increase the side effects of 5-fluorouracil, 5-FU. Tell your doctor or health care professional if you have diarrhea or mouth sores that do not get better or that get worse. What side effects may I notice from receiving this medicine? Side effects that you should report to your doctor or health care professional as soon as possible:  allergic reactions like skin rash, itching or hives, swelling of the face, lips, or tongue  breathing problems  fever, infection  mouth sores  unusual bleeding or bruising  unusually weak or tired Side effects that usually do not require medical  attention (report to your doctor or health care professional if they continue or are bothersome):  constipation or diarrhea  loss of appetite  nausea, vomiting This list may not describe all possible side effects. Call your doctor for medical advice about side effects. You may report side effects to FDA at 1-800-FDA-1088. Where should I keep my medicine? This drug is given in a hospital or clinic and will not be stored at home. NOTE: This sheet is a summary. It may not cover all possible information. If you have questions about this medicine, talk to your doctor, pharmacist, or health care provider.  2020 Elsevier/Gold Standard (2007-12-01 16:50:29)  Irinotecan injection What is this medicine? IRINOTECAN (ir in oh TEE kan ) is a chemotherapy drug. It is used to treat colon and rectal cancer. This medicine may be used for other purposes; ask your health care provider or pharmacist if you have questions. COMMON BRAND NAME(S): Camptosar What should I tell my health care provider before I take this medicine? They need to know if  you have any of these conditions:  dehydration  diarrhea  infection (especially a virus infection such as chickenpox, cold sores, or herpes)  liver disease  low blood counts, like low white cell, platelet, or red cell counts  low levels of calcium, magnesium, or potassium in the blood  recent or ongoing radiation therapy  an unusual or allergic reaction to irinotecan, other medicines, foods, dyes, or preservatives  pregnant or trying to get pregnant  breast-feeding How should I use this medicine? This drug is given as an infusion into a vein. It is administered in a hospital or clinic by a specially trained health care professional. Talk to your pediatrician regarding the use of this medicine in children. Special care may be needed. Overdosage: If you think you have taken too much of this medicine contact a poison control center or emergency room at  once. NOTE: This medicine is only for you. Do not share this medicine with others. What if I miss a dose? It is important not to miss your dose. Call your doctor or health care professional if you are unable to keep an appointment. What may interact with this medicine? This medicine may interact with the following medications:  antiviral medicines for HIV or AIDS  certain antibiotics like rifampin or rifabutin  certain medicines for fungal infections like itraconazole, ketoconazole, posaconazole, and voriconazole  certain medicines for seizures like carbamazepine, phenobarbital, phenotoin  clarithromycin  gemfibrozil  nefazodone  St. John's Wort This list may not describe all possible interactions. Give your health care provider a list of all the medicines, herbs, non-prescription drugs, or dietary supplements you use. Also tell them if you smoke, drink alcohol, or use illegal drugs. Some items may interact with your medicine. What should I watch for while using this medicine? Your condition will be monitored carefully while you are receiving this medicine. You will need important blood work done while you are taking this medicine. This drug may make you feel generally unwell. This is not uncommon, as chemotherapy can affect healthy cells as well as cancer cells. Report any side effects. Continue your course of treatment even though you feel ill unless your doctor tells you to stop. In some cases, you may be given additional medicines to help with side effects. Follow all directions for their use. You may get drowsy or dizzy. Do not drive, use machinery, or do anything that needs mental alertness until you know how this medicine affects you. Do not stand or sit up quickly, especially if you are an older patient. This reduces the risk of dizzy or fainting spells. Call your health care professional for advice if you get a fever, chills, or sore throat, or other symptoms of a cold or flu. Do  not treat yourself. This medicine decreases your body's ability to fight infections. Try to avoid being around people who are sick. Avoid taking products that contain aspirin, acetaminophen, ibuprofen, naproxen, or ketoprofen unless instructed by your doctor. These medicines may hide a fever. This medicine may increase your risk to bruise or bleed. Call your doctor or health care professional if you notice any unusual bleeding. Be careful brushing and flossing your teeth or using a toothpick because you may get an infection or bleed more easily. If you have any dental work done, tell your dentist you are receiving this medicine. Do not become pregnant while taking this medicine or for 6 months after stopping it. Women should inform their health care professional if they wish to become  pregnant or think they might be pregnant. Men should not father a child while taking this medicine and for 3 months after stopping it. There is potential for serious side effects to an unborn child. Talk to your health care professional for more information. Do not breast-feed an infant while taking this medicine or for 7 days after stopping it. This medicine has caused ovarian failure in some women. This medicine may make it more difficult to get pregnant. Talk to your health care professional if you are concerned about your fertility. This medicine has caused decreased sperm counts in some men. This may make it more difficult to father a child. Talk to your health care professional if you are concerned about your fertility. What side effects may I notice from receiving this medicine? Side effects that you should report to your doctor or health care professional as soon as possible:  allergic reactions like skin rash, itching or hives, swelling of the face, lips, or tongue  chest pain  diarrhea  flushing, runny nose, sweating during infusion  low blood counts - this medicine may decrease the number of white blood  cells, red blood cells and platelets. You may be at increased risk for infections and bleeding.  nausea, vomiting  pain, swelling, warmth in the leg  signs of decreased platelets or bleeding - bruising, pinpoint red spots on the skin, black, tarry stools, blood in the urine  signs of infection - fever or chills, cough, sore throat, pain or difficulty passing urine  signs of decreased red blood cells - unusually weak or tired, fainting spells, lightheadedness Side effects that usually do not require medical attention (report to your doctor or health care professional if they continue or are bothersome):  constipation  hair loss  headache  loss of appetite  mouth sores  stomach pain This list may not describe all possible side effects. Call your doctor for medical advice about side effects. You may report side effects to FDA at 1-800-FDA-1088. Where should I keep my medicine? This drug is given in a hospital or clinic and will not be stored at home. NOTE: This sheet is a summary. It may not cover all possible information. If you have questions about this medicine, talk to your doctor, pharmacist, or health care provider.  2020 Elsevier/Gold Standard (2018-07-17 10:09:17)  Fluorouracil, 5FU; Diclofenac topical cream What is this medicine? FLUOROURACIL; DICLOFENAC (flure oh YOOR a sil; dye KLOE fen ak) is a combination of a topical chemotherapy agent and non-steroidal anti-inflammatory drug (NSAID). It is used on the skin to treat skin cancer and skin conditions that could become cancer. This medicine may be used for other purposes; ask your health care provider or pharmacist if you have questions. COMMON BRAND NAME(S): FLUORAC What should I tell my health care provider before I take this medicine? They need to know if you have any of these conditions:  bleeding problems  cigarette smoker  DPD enzyme deficiency  heart disease  high blood pressure  if you frequently drink  alcohol containing drinks  kidney disease  liver disease  open or infected skin  stomach problems  swelling or open sores at the treatment site  recent or planned coronary artery bypass graft (CABG) surgery  an unusual or allergic reaction to fluorouracil, diclofenac, aspirin, other NSAIDs, other medicines, foods, dyes, or preservatives  pregnant or trying to get pregnant  breast-feeding How should I use this medicine? This medicine is only for use on the skin. Follow the directions  on the prescription label. Wash hands before and after use. Wash affected area and gently pat dry. To apply this medicine use a cotton-tipped applicator, or use gloves if applying with fingertips. If applied with unprotected fingertips, it is very important to wash your hands well after you apply this medicine. Avoid applying to the eyes, nose, or mouth. Apply enough medicine to cover the affected area. You can cover the area with a light gauze dressing, but do not use tight or air-tight dressings. Finish the full course prescribed by your doctor or health care professional, even if you think your condition is better. Do not stop taking except on the advice of your doctor or health care professional. Talk to your pediatrician regarding the use of this medicine in children. Special care may be needed. Overdosage: If you think you have taken too much of this medicine contact a poison control center or emergency room at once. NOTE: This medicine is only for you. Do not share this medicine with others. What if I miss a dose? If you miss a dose, apply it as soon as you can. If it is almost time for your next dose, only use that dose. Do not apply extra doses. Contact your doctor or health care professional if you miss more than one dose. What may interact with this medicine? Interactions are not expected. Do not use any other skin products without telling your doctor or health care professional. This list may not  describe all possible interactions. Give your health care provider a list of all the medicines, herbs, non-prescription drugs, or dietary supplements you use. Also tell them if you smoke, drink alcohol, or use illegal drugs. Some items may interact with your medicine. What should I watch for while using this medicine? Visit your doctor or healthcare provider for checks on your progress. You will need to use this medicine for 2 to 6 weeks. This may be longer depending on the condition being treated. You may not see full healing for another 1 to 2 months after you stop using the medicine. This medicine may cause serious skin reactions. They can happen weeks to months after starting the medicine. Contact your healthcare provider right away if you notice fevers or flu-like symptoms with a rash. The rash may be red or purple and then turn into blisters or peeling of the skin. Or, you might notice a red rash with swelling of the face, lips or lymph nodes in your neck or under your arms. Treated areas of skin can look unsightly during and for several weeks after treatment with this medicine. This medicine can make you more sensitive to the sun. Keep out of the sun. If you cannot avoid being in the sun, wear protective clothing and use sunscreen. Do not use sun lamps or tanning beds/booths. If a pet comes in contact with the area where this medicine was applied to your skin or if it is ingested, they may have a serious risk of side effects. If accidental contact happens, the skin of the pet should be washed right away with soap and water. Contact your vet right away if your pet becomes exposed. Do not become pregnant while taking this medicine. Women should inform their doctor if they wish to become pregnant or think they might be pregnant. There is a potential for serious side effects to an unborn child. Talk to your healthcare provider or pharmacist for more information. What side effects may I notice from  receiving this medicine? Side  effects that you should report to your doctor or health care professional as soon as possible:  allergic reactions like skin rash, itching or hives, swelling of the face, lips, or tongue  black or bloody stools, blood in the urine or vomit  blurred vision  chest pain  difficulty breathing or wheezing  rash, fever, and swollen lymph nodes  redness, blistering, peeling or loosening of the skin, including inside the mouth  severe redness and swelling of normal skin  slurred speech or weakness on one side of the body  trouble passing urine or change in the amount of urine  unexplained weight gain or swelling  unusually weak or tired  yellowing of eyes or skin Side effects that usually do not require medical attention (report to your doctor or health care professional if they continue or are bothersome):  increased sensitivity of the skin to sun and ultraviolet light  pain and burning of the affected area  scaling or swelling of the affected area  skin rash, itching of the affected area  tenderness This list may not describe all possible side effects. Call your doctor for medical advice about side effects. You may report side effects to FDA at 1-800-FDA-1088. Where should I keep my medicine? Keep out of the reach of children and pets. Store at room temperature between 20 and 25 degrees C (68 and 77 degrees F). Throw away any unused medicine after the expiration date. NOTE: This sheet is a summary. It may not cover all possible information. If you have questions about this medicine, talk to your doctor, pharmacist, or health care provider.  2020 Elsevier/Gold Standard (2018-08-12 13:31:57)

## 2019-05-12 ENCOUNTER — Telehealth: Payer: Self-pay | Admitting: Oncology

## 2019-05-12 NOTE — Telephone Encounter (Signed)
Scheduled per los. Called and left msg. Mailed prntout

## 2019-05-12 NOTE — Progress Notes (Signed)
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  As she was receiving oxaliplatin she reported some cramping in her hands and later noted twitching in her eye.  She was reassured that her hand cramping could be due to the oxaliplatin.  She was told to keep her hands under the warm blanket that she was given and to work gloves when she is outside or exposed to cold temperatures.  Additionally she was reassured that the eye twitching that she had experienced could be related to premeds that she had been given for her chemotherapy.  She was told to call or return if either of these worsened.  She expressed understanding and agreement with this plan.  Sandi Mealy, MHS, PA-C Physician Assistant

## 2019-05-13 ENCOUNTER — Other Ambulatory Visit: Payer: Self-pay | Admitting: Endocrinology

## 2019-05-13 ENCOUNTER — Inpatient Hospital Stay: Payer: BC Managed Care – PPO

## 2019-05-13 ENCOUNTER — Other Ambulatory Visit: Payer: Self-pay

## 2019-05-13 VITALS — BP 125/89 | HR 100 | Temp 97.8°F | Resp 18

## 2019-05-13 DIAGNOSIS — Z5111 Encounter for antineoplastic chemotherapy: Secondary | ICD-10-CM | POA: Diagnosis not present

## 2019-05-13 DIAGNOSIS — C7A8 Other malignant neuroendocrine tumors: Secondary | ICD-10-CM | POA: Diagnosis not present

## 2019-05-13 DIAGNOSIS — E119 Type 2 diabetes mellitus without complications: Secondary | ICD-10-CM | POA: Diagnosis not present

## 2019-05-13 DIAGNOSIS — I10 Essential (primary) hypertension: Secondary | ICD-10-CM | POA: Diagnosis not present

## 2019-05-13 DIAGNOSIS — Z905 Acquired absence of kidney: Secondary | ICD-10-CM | POA: Diagnosis not present

## 2019-05-13 DIAGNOSIS — C250 Malignant neoplasm of head of pancreas: Secondary | ICD-10-CM | POA: Diagnosis not present

## 2019-05-13 DIAGNOSIS — Z85528 Personal history of other malignant neoplasm of kidney: Secondary | ICD-10-CM | POA: Diagnosis not present

## 2019-05-13 MED ORDER — SODIUM CHLORIDE 0.9% FLUSH
10.0000 mL | INTRAVENOUS | Status: DC | PRN
  Administered 2019-05-13: 10 mL
  Filled 2019-05-13: qty 10

## 2019-05-13 MED ORDER — HEPARIN SOD (PORK) LOCK FLUSH 100 UNIT/ML IV SOLN
500.0000 [IU] | Freq: Once | INTRAVENOUS | Status: AC | PRN
  Administered 2019-05-13: 500 [IU]
  Filled 2019-05-13: qty 5

## 2019-05-23 ENCOUNTER — Other Ambulatory Visit: Payer: Self-pay | Admitting: Oncology

## 2019-05-25 ENCOUNTER — Other Ambulatory Visit: Payer: Self-pay | Admitting: Endocrinology

## 2019-05-25 ENCOUNTER — Inpatient Hospital Stay: Payer: BC Managed Care – PPO | Admitting: Oncology

## 2019-05-25 ENCOUNTER — Inpatient Hospital Stay: Payer: BC Managed Care – PPO

## 2019-05-25 ENCOUNTER — Inpatient Hospital Stay: Payer: BC Managed Care – PPO | Admitting: Nutrition

## 2019-05-25 ENCOUNTER — Other Ambulatory Visit: Payer: Self-pay

## 2019-05-25 VITALS — BP 133/92 | HR 78 | Temp 98.3°F | Resp 18 | Ht 68.0 in | Wt 193.7 lb

## 2019-05-25 DIAGNOSIS — E119 Type 2 diabetes mellitus without complications: Secondary | ICD-10-CM | POA: Diagnosis not present

## 2019-05-25 DIAGNOSIS — Z85528 Personal history of other malignant neoplasm of kidney: Secondary | ICD-10-CM | POA: Diagnosis not present

## 2019-05-25 DIAGNOSIS — C250 Malignant neoplasm of head of pancreas: Secondary | ICD-10-CM | POA: Diagnosis not present

## 2019-05-25 DIAGNOSIS — Z5111 Encounter for antineoplastic chemotherapy: Secondary | ICD-10-CM | POA: Diagnosis not present

## 2019-05-25 DIAGNOSIS — I10 Essential (primary) hypertension: Secondary | ICD-10-CM | POA: Diagnosis not present

## 2019-05-25 DIAGNOSIS — Z905 Acquired absence of kidney: Secondary | ICD-10-CM | POA: Diagnosis not present

## 2019-05-25 DIAGNOSIS — Z95828 Presence of other vascular implants and grafts: Secondary | ICD-10-CM

## 2019-05-25 DIAGNOSIS — E1165 Type 2 diabetes mellitus with hyperglycemia: Secondary | ICD-10-CM | POA: Diagnosis not present

## 2019-05-25 DIAGNOSIS — C7A8 Other malignant neuroendocrine tumors: Secondary | ICD-10-CM | POA: Diagnosis not present

## 2019-05-25 DIAGNOSIS — C187 Malignant neoplasm of sigmoid colon: Secondary | ICD-10-CM | POA: Diagnosis not present

## 2019-05-25 DIAGNOSIS — Z794 Long term (current) use of insulin: Secondary | ICD-10-CM | POA: Diagnosis not present

## 2019-05-25 LAB — CMP (CANCER CENTER ONLY)
ALT: 34 U/L (ref 0–44)
AST: 22 U/L (ref 15–41)
Albumin: 3.6 g/dL (ref 3.5–5.0)
Alkaline Phosphatase: 77 U/L (ref 38–126)
Anion gap: 11 (ref 5–15)
BUN: 12 mg/dL (ref 6–20)
CO2: 26 mmol/L (ref 22–32)
Calcium: 9.1 mg/dL (ref 8.9–10.3)
Chloride: 105 mmol/L (ref 98–111)
Creatinine: 0.85 mg/dL (ref 0.44–1.00)
GFR, Est AFR Am: >60 mL/min (ref >60)
GFR, Estimated: >60 mL/min (ref >60)
Glucose, Bld: 150 mg/dL — ABNORMAL HIGH (ref 70–99)
Potassium: 3.3 mmol/L — ABNORMAL LOW (ref 3.5–5.1)
Sodium: 142 mmol/L (ref 135–145)
Total Bilirubin: 0.3 mg/dL (ref 0.3–1.2)
Total Protein: 6.9 g/dL (ref 6.5–8.1)

## 2019-05-25 LAB — CBC WITH DIFFERENTIAL (CANCER CENTER ONLY)
Abs Immature Granulocytes: 0 10*3/uL (ref 0.00–0.07)
Basophils Absolute: 0 10*3/uL (ref 0.0–0.1)
Basophils Relative: 1 %
Eosinophils Absolute: 0 10*3/uL (ref 0.0–0.5)
Eosinophils Relative: 1 %
HCT: 30.8 % — ABNORMAL LOW (ref 36.0–46.0)
Hemoglobin: 10 g/dL — ABNORMAL LOW (ref 12.0–15.0)
Immature Granulocytes: 0 %
Lymphocytes Relative: 44 %
Lymphs Abs: 2.3 10*3/uL (ref 0.7–4.0)
MCH: 28.2 pg (ref 26.0–34.0)
MCHC: 32.5 g/dL (ref 30.0–36.0)
MCV: 86.8 fL (ref 80.0–100.0)
Monocytes Absolute: 0.5 10*3/uL (ref 0.1–1.0)
Monocytes Relative: 10 %
Neutro Abs: 2.3 10*3/uL (ref 1.7–7.7)
Neutrophils Relative %: 44 %
Platelet Count: 264 10*3/uL (ref 150–400)
RBC: 3.55 MIL/uL — ABNORMAL LOW (ref 3.87–5.11)
RDW: 14.3 % (ref 11.5–15.5)
WBC Count: 5.2 10*3/uL (ref 4.0–10.5)
nRBC: 0 % (ref 0.0–0.2)

## 2019-05-25 MED ORDER — DEXTROSE 5 % IV SOLN
Freq: Once | INTRAVENOUS | Status: AC
  Filled 2019-05-25: qty 250

## 2019-05-25 MED ORDER — OXALIPLATIN CHEMO INJECTION 100 MG/20ML
85.0000 mg/m2 | Freq: Once | INTRAVENOUS | Status: AC
  Administered 2019-05-25: 175 mg via INTRAVENOUS
  Filled 2019-05-25: qty 35

## 2019-05-25 MED ORDER — SODIUM CHLORIDE 0.9 % IV SOLN
2000.0000 mg/m2 | INTRAVENOUS | Status: DC
  Administered 2019-05-25: 4100 mg via INTRAVENOUS
  Filled 2019-05-25: qty 82

## 2019-05-25 MED ORDER — SODIUM CHLORIDE 0.9% FLUSH
10.0000 mL | Freq: Once | INTRAVENOUS | Status: AC
  Administered 2019-05-25: 10 mL
  Filled 2019-05-25: qty 10

## 2019-05-25 MED ORDER — ATROPINE SULFATE 1 MG/ML IJ SOLN
0.5000 mg | Freq: Once | INTRAMUSCULAR | Status: AC | PRN
  Administered 2019-05-25: 0.5 mg via INTRAVENOUS

## 2019-05-25 MED ORDER — ATROPINE SULFATE 1 MG/ML IJ SOLN
INTRAMUSCULAR | Status: AC
  Filled 2019-05-25: qty 1

## 2019-05-25 MED ORDER — SODIUM CHLORIDE 0.9 % IV SOLN
150.0000 mg/m2 | Freq: Once | INTRAVENOUS | Status: AC
  Administered 2019-05-25: 300 mg via INTRAVENOUS
  Filled 2019-05-25: qty 15

## 2019-05-25 MED ORDER — SODIUM CHLORIDE 0.9 % IV SOLN
Freq: Once | INTRAVENOUS | Status: AC
  Filled 2019-05-25: qty 5

## 2019-05-25 MED ORDER — SODIUM CHLORIDE 0.9 % IV SOLN
400.0000 mg/m2 | Freq: Once | INTRAVENOUS | Status: AC
  Administered 2019-05-25: 824 mg via INTRAVENOUS
  Filled 2019-05-25: qty 41.2

## 2019-05-25 MED ORDER — PALONOSETRON HCL INJECTION 0.25 MG/5ML
0.2500 mg | Freq: Once | INTRAVENOUS | Status: AC
  Administered 2019-05-25: 10:00:00 0.25 mg via INTRAVENOUS

## 2019-05-25 MED ORDER — PALONOSETRON HCL INJECTION 0.25 MG/5ML
INTRAVENOUS | Status: AC
  Filled 2019-05-25: qty 5

## 2019-05-25 NOTE — Progress Notes (Signed)
  Alyssa Ross OFFICE PROGRESS NOTE   Diagnosis: Pancreas cancer  INTERVAL HISTORY:   Alyssa Ross completed another cycle of FOLFIRINOX on 05/11/2019.  She has noted darkening of the tongue.  No mouth sores or nausea.  She had tingling of the lips, cold sensitivity, and cramping at the lower legs prior to leaving the Cancer center on 05/11/2019.  She had cold sensitivity for several days following chemotherapy.  No neuropathy symptoms at present.  One episode of diarrhea.  Objective:  Vital signs in last 24 hours:  Blood pressure (!) 133/92, pulse 78, temperature 98.3 F (36.8 C), temperature source Temporal, resp. rate 18, height 5\' 8"  (1.727 m), weight 193 lb 11.2 oz (87.9 kg), last menstrual period 09/17/2012, SpO2 100 %.    HEENT: Few areas of tongue hyperpigmentation, no thrush or ulcers Cardio: Regular rate and rhythm GI: Soft, nontender, no hepatomegaly Vascular: No leg edema  Skin: Palms without erythema  Portacath/PICC-without erythema  Lab Results:  Lab Results  Component Value Date   WBC 5.2 05/25/2019   HGB 10.0 (L) 05/25/2019   HCT 30.8 (L) 05/25/2019   MCV 86.8 05/25/2019   PLT 264 05/25/2019   NEUTROABS 2.3 05/25/2019    CMP  Lab Results  Component Value Date   NA 142 05/25/2019   K 3.3 (L) 05/25/2019   CL 105 05/25/2019   CO2 26 05/25/2019   GLUCOSE 150 (H) 05/25/2019   BUN 12 05/25/2019   CREATININE 0.85 05/25/2019   CALCIUM 9.1 05/25/2019   PROT 6.9 05/25/2019   ALBUMIN 3.6 05/25/2019   AST 22 05/25/2019   ALT 34 05/25/2019   ALKPHOS 77 05/25/2019   BILITOT 0.3 05/25/2019   GFRNONAA >60 05/25/2019   GFRAA >60 05/25/2019    Medications: I have reviewed the patient's current medications.   Assessment/Plan: 1. 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 Tumor invades the duodenal wall and vascular groove, resection margins negative, 4/34 lymph nodes positive ? EUS FNA biopsy of pancreas  mass on 07/03/2018-well-differentiated neuroendocrine tumor ? CTs 01/29/2019-ill-defined pancreas head mass, 5 pulmonary nodules-1 with a small amount of central cavitation, tumor abuts the left margin of the portal vein indistinct density surrounding, hepatic artery, complex cystic lesion of the right kidney, right adrenal mass-characterized as an adenoma on a [facility] MRI 12/21/2018 ? 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 Elevated preoperative CA 19-9 ? CT chest 04/16/2019-multiple bilateral pulmonary nodules, some with increased cavitation, stable in size ? Cycle 1 FOLFIRINOX 04/27/2019 ? Cycle 2 FOLFIRINOX 05/11/2019 ? Cycle 3 FOLFIRINOX 05/23/2019  2. 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 Diabetes 4. Hypertension 5. Family history of pancreas cancer 6. Port-A-Cath placement, Dr. Barry Dienes, 04/21/2019    Disposition: Alyssa Ross has completed 2 cycles of FOLFIRINOX.  She has tolerated the chemotherapy well.  She will complete cycle 3 today.  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  She understands the symptoms could occur again today.  She will return for an office visit and chemotherapy in 2 weeks.  The plan is to schedule a restaging chest CT after 5 cycles of FOLFIRINOX.  [name], MD  05/25/2019  9:17 AM

## 2019-05-25 NOTE — Patient Instructions (Signed)
[facility] Discharge Instructions for Patients Receiving Chemotherapy  Today you received the following chemotherapy agents: Oxaliplatin, leucovorin, Irinotecan, 5FU  To help prevent nausea and vomiting after your treatment, we encourage you to take your nausea medication as directed.   If you develop nausea and vomiting that is not controlled by your nausea medication, call the clinic.   BELOW ARE SYMPTOMS THAT SHOULD BE REPORTED IMMEDIATELY:  *FEVER GREATER THAN 100.5 F  *CHILLS WITH OR WITHOUT FEVER  NAUSEA AND VOMITING THAT IS NOT CONTROLLED WITH YOUR NAUSEA MEDICATION  *UNUSUAL SHORTNESS OF BREATH  *UNUSUAL BRUISING OR BLEEDING  TENDERNESS IN MOUTH AND THROAT WITH OR WITHOUT PRESENCE OF ULCERS  *URINARY PROBLEMS  *BOWEL PROBLEMS  UNUSUAL RASH Items with * indicate a potential emergency and should be followed up as soon as possible.  Feel free to call the clinic should you have any questions or concerns. The clinic phone number is [phone].  Please show the [facility] at check-in to the Emergency Department and triage nurse.  Oxaliplatin Injection What is this medicine? OXALIPLATIN (ox AL i PLA tin) is a chemotherapy drug. It targets fast dividing cells, like cancer cells, and causes these cells to die. This medicine is used to treat cancers of the colon and rectum, and many other cancers. This medicine may be used for other purposes; ask your health care provider or pharmacist if you have questions. COMMON BRAND NAME(S): Eloxatin What should I tell my health care provider before I take this medicine? They need to know if you have any of these conditions:  kidney disease  an unusual or allergic reaction to oxaliplatin, other chemotherapy, other medicines, foods, dyes, or preservatives  pregnant or trying to get pregnant  breast-feeding How should I use this medicine? This drug is given as an infusion into a vein. It is administered in a  hospital or clinic by a specially trained health care professional. Talk to your pediatrician regarding the use of this medicine in children. Special care may be needed. Overdosage: If you think you have taken too much of this medicine contact a poison control center or emergency room at once. NOTE: This medicine is only for you. Do not share this medicine with others. What if I miss a dose? It is important not to miss a dose. Call your doctor or health care professional if you are unable to keep an appointment. What may interact with this medicine?  medicines to increase blood counts like filgrastim, pegfilgrastim, sargramostim  probenecid  some antibiotics like amikacin, gentamicin, neomycin, polymyxin B, streptomycin, tobramycin  zalcitabine Talk to your doctor or health care professional before taking any of these medicines:  acetaminophen  aspirin  ibuprofen  ketoprofen  naproxen This list may not describe all possible interactions. Give your health care provider a list of all the medicines, herbs, non-prescription drugs, or dietary supplements you use. Also tell them if you smoke, drink alcohol, or use illegal drugs. Some items may interact with your medicine. What should I watch for while using this medicine? Your condition will be monitored carefully while you are receiving this medicine. You will need important blood work done while you are taking this medicine. This medicine can make you more sensitive to cold. Do not drink cold drinks or use ice. Cover exposed skin before coming in contact with cold temperatures or cold objects. When out in cold weather wear warm clothing and cover your mouth and nose to warm the air that goes  into your lungs. Tell your doctor if you get sensitive to the cold. This drug may make you feel generally unwell. This is not uncommon, as chemotherapy can affect healthy cells as well as cancer cells. Report any side effects. Continue your course of  treatment even though you feel ill unless your doctor tells you to stop. In some cases, you may be given additional medicines to help with side effects. Follow all directions for their use. Call your doctor or health care professional for advice if you get a fever, chills or sore throat, or other symptoms of a cold or flu. Do not treat yourself. This drug decreases your body's ability to fight infections. Try to avoid being around people who are sick. This medicine may increase your risk to bruise or bleed. Call your doctor or health care professional if you notice any unusual bleeding. Be careful brushing and flossing your teeth or using a toothpick because you may get an infection or bleed more easily. If you have any dental work done, tell your dentist you are receiving this medicine. Avoid taking products that contain aspirin, acetaminophen, ibuprofen, naproxen, or ketoprofen unless instructed by your doctor. These medicines may hide a fever. Do not become pregnant while taking this medicine. Women should inform their doctor if they wish to become pregnant or think they might be pregnant. There is a potential for serious side effects to an unborn child. Talk to your health care professional or pharmacist for more information. Do not breast-feed an infant while taking this medicine. Call your doctor or health care professional if you get diarrhea. Do not treat yourself. What side effects may I notice from receiving this medicine? Side effects that you should report to your doctor or health care professional as soon as possible:  allergic reactions like skin rash, itching or hives, swelling of the face, lips, or tongue  low blood counts - This drug may decrease the number of white blood cells, red blood cells and platelets. You may be at increased risk for infections and bleeding.  signs of infection - fever or chills, cough, sore throat, pain or difficulty passing urine  signs of decreased  platelets or bleeding - bruising, pinpoint red spots on the skin, black, tarry stools, nosebleeds  signs of decreased red blood cells - unusually weak or tired, fainting spells, lightheadedness  breathing problems  chest pain, pressure  cough  diarrhea  jaw tightness  mouth sores  nausea and vomiting  pain, swelling, redness or irritation at the injection site  pain, tingling, numbness in the hands or feet  problems with balance, talking, walking  redness, blistering, peeling or loosening of the skin, including inside the mouth  trouble passing urine or change in the amount of urine Side effects that usually do not require medical attention (report to your doctor or health care professional if they continue or are bothersome):  changes in vision  constipation  hair loss  loss of appetite  metallic taste in the mouth or changes in taste  stomach pain This list may not describe all possible side effects. Call your doctor for medical advice about side effects. You may report side effects to FDA at 1-800-FDA-1088. Where should I keep my medicine? This drug is given in a hospital or clinic and will not be stored at home. NOTE: This sheet is a summary. It may not cover all possible information. If you have questions about this medicine, talk to your doctor, pharmacist, or health care provider.  2020 Elsevier/Gold Standard (2007-12-22 17:22:47)  Leucovorin injection What is this medicine? LEUCOVORIN (loo koe VOR in) is used to prevent or treat the harmful effects of some medicines. This medicine is used to treat anemia caused by a low amount of folic acid in the body. It is also used with 5-fluorouracil (5-FU) to treat colon cancer. This medicine may be used for other purposes; ask your health care provider or pharmacist if you have questions. What should I tell my health care provider before I take this medicine? They need to know if you have any of these  conditions:  anemia from low levels of vitamin B-12 in the blood  an unusual or allergic reaction to leucovorin, folic acid, other medicines, foods, dyes, or preservatives  pregnant or trying to get pregnant  breast-feeding How should I use this medicine? This medicine is for injection into a muscle or into a vein. It is given by a health care professional in a hospital or clinic setting. Talk to your pediatrician regarding the use of this medicine in children. Special care may be needed. Overdosage: If you think you have taken too much of this medicine contact a poison control center or emergency room at once. NOTE: This medicine is only for you. Do not share this medicine with others. What if I miss a dose? This does not apply. What may interact with this medicine?  capecitabine  fluorouracil  phenobarbital  phenytoin  primidone  trimethoprim-sulfamethoxazole This list may not describe all possible interactions. Give your health care provider a list of all the medicines, herbs, non-prescription drugs, or dietary supplements you use. Also tell them if you smoke, drink alcohol, or use illegal drugs. Some items may interact with your medicine. What should I watch for while using this medicine? Your condition will be monitored carefully while you are receiving this medicine. This medicine may increase the side effects of 5-fluorouracil, 5-FU. Tell your doctor or health care professional if you have diarrhea or mouth sores that do not get better or that get worse. What side effects may I notice from receiving this medicine? Side effects that you should report to your doctor or health care professional as soon as possible:  allergic reactions like skin rash, itching or hives, swelling of the face, lips, or tongue  breathing problems  fever, infection  mouth sores  unusual bleeding or bruising  unusually weak or tired Side effects that usually do not require medical  attention (report to your doctor or health care professional if they continue or are bothersome):  constipation or diarrhea  loss of appetite  nausea, vomiting This list may not describe all possible side effects. Call your doctor for medical advice about side effects. You may report side effects to FDA at 1-800-FDA-1088. Where should I keep my medicine? This drug is given in a hospital or clinic and will not be stored at home. NOTE: This sheet is a summary. It may not cover all possible information. If you have questions about this medicine, talk to your doctor, pharmacist, or health care provider.  2020 Elsevier/Gold Standard (2007-12-01 16:50:29)  Irinotecan injection What is this medicine? IRINOTECAN (ir in oh TEE kan ) is a chemotherapy drug. It is used to treat colon and rectal cancer. This medicine may be used for other purposes; ask your health care provider or pharmacist if you have questions. COMMON BRAND NAME(S): Camptosar What should I tell my health care provider before I take this medicine? They need to know if  you have any of these conditions:  dehydration  diarrhea  infection (especially a virus infection such as chickenpox, cold sores, or herpes)  liver disease  low blood counts, like low white cell, platelet, or red cell counts  low levels of calcium, magnesium, or potassium in the blood  recent or ongoing radiation therapy  an unusual or allergic reaction to irinotecan, other medicines, foods, dyes, or preservatives  pregnant or trying to get pregnant  breast-feeding How should I use this medicine? This drug is given as an infusion into a vein. It is administered in a hospital or clinic by a specially trained health care professional. Talk to your pediatrician regarding the use of this medicine in children. Special care may be needed. Overdosage: If you think you have taken too much of this medicine contact a poison control center or emergency room at  once. NOTE: This medicine is only for you. Do not share this medicine with others. What if I miss a dose? It is important not to miss your dose. Call your doctor or health care professional if you are unable to keep an appointment. What may interact with this medicine? This medicine may interact with the following medications:  antiviral medicines for HIV or AIDS  certain antibiotics like rifampin or rifabutin  certain medicines for fungal infections like itraconazole, ketoconazole, posaconazole, and voriconazole  certain medicines for seizures like carbamazepine, phenobarbital, phenotoin  clarithromycin  gemfibrozil  nefazodone  St. John's Wort This list may not describe all possible interactions. Give your health care provider a list of all the medicines, herbs, non-prescription drugs, or dietary supplements you use. Also tell them if you smoke, drink alcohol, or use illegal drugs. Some items may interact with your medicine. What should I watch for while using this medicine? Your condition will be monitored carefully while you are receiving this medicine. You will need important blood work done while you are taking this medicine. This drug may make you feel generally unwell. This is not uncommon, as chemotherapy can affect healthy cells as well as cancer cells. Report any side effects. Continue your course of treatment even though you feel ill unless your doctor tells you to stop. In some cases, you may be given additional medicines to help with side effects. Follow all directions for their use. You may get drowsy or dizzy. Do not drive, use machinery, or do anything that needs mental alertness until you know how this medicine affects you. Do not stand or sit up quickly, especially if you are an older patient. This reduces the risk of dizzy or fainting spells. Call your health care professional for advice if you get a fever, chills, or sore throat, or other symptoms of a cold or flu. Do  not treat yourself. This medicine decreases your body's ability to fight infections. Try to avoid being around people who are sick. Avoid taking products that contain aspirin, acetaminophen, ibuprofen, naproxen, or ketoprofen unless instructed by your doctor. These medicines may hide a fever. This medicine may increase your risk to bruise or bleed. Call your doctor or health care professional if you notice any unusual bleeding. Be careful brushing and flossing your teeth or using a toothpick because you may get an infection or bleed more easily. If you have any dental work done, tell your dentist you are receiving this medicine. Do not become pregnant while taking this medicine or for 6 months after stopping it. Women should inform their health care professional if they wish to become  pregnant or think they might be pregnant. Men should not father a child while taking this medicine and for 3 months after stopping it. There is potential for serious side effects to an unborn child. Talk to your health care professional for more information. Do not breast-feed an infant while taking this medicine or for 7 days after stopping it. This medicine has caused ovarian failure in some women. This medicine may make it more difficult to get pregnant. Talk to your health care professional if you are concerned about your fertility. This medicine has caused decreased sperm counts in some men. This may make it more difficult to father a child. Talk to your health care professional if you are concerned about your fertility. What side effects may I notice from receiving this medicine? Side effects that you should report to your doctor or health care professional as soon as possible:  allergic reactions like skin rash, itching or hives, swelling of the face, lips, or tongue  chest pain  diarrhea  flushing, runny nose, sweating during infusion  low blood counts - this medicine may decrease the number of white blood  cells, red blood cells and platelets. You may be at increased risk for infections and bleeding.  nausea, vomiting  pain, swelling, warmth in the leg  signs of decreased platelets or bleeding - bruising, pinpoint red spots on the skin, black, tarry stools, blood in the urine  signs of infection - fever or chills, cough, sore throat, pain or difficulty passing urine  signs of decreased red blood cells - unusually weak or tired, fainting spells, lightheadedness Side effects that usually do not require medical attention (report to your doctor or health care professional if they continue or are bothersome):  constipation  hair loss  headache  loss of appetite  mouth sores  stomach pain This list may not describe all possible side effects. Call your doctor for medical advice about side effects. You may report side effects to FDA at 1-800-FDA-1088. Where should I keep my medicine? This drug is given in a hospital or clinic and will not be stored at home. NOTE: This sheet is a summary. It may not cover all possible information. If you have questions about this medicine, talk to your doctor, pharmacist, or health care provider.  2020 Elsevier/Gold Standard (2018-07-17 10:09:17)  Fluorouracil, 5FU; Diclofenac topical cream What is this medicine? FLUOROURACIL; DICLOFENAC (flure oh YOOR a sil; dye KLOE fen ak) is a combination of a topical chemotherapy agent and non-steroidal anti-inflammatory drug (NSAID). It is used on the skin to treat skin cancer and skin conditions that could become cancer. This medicine may be used for other purposes; ask your health care provider or pharmacist if you have questions. COMMON BRAND NAME(S): FLUORAC What should I tell my health care provider before I take this medicine? They need to know if you have any of these conditions:  bleeding problems  cigarette smoker  DPD enzyme deficiency  heart disease  high blood pressure  if you frequently drink  alcohol containing drinks  kidney disease  liver disease  open or infected skin  stomach problems  swelling or open sores at the treatment site  recent or planned coronary artery bypass graft (CABG) surgery  an unusual or allergic reaction to fluorouracil, diclofenac, aspirin, other NSAIDs, other medicines, foods, dyes, or preservatives  pregnant or trying to get pregnant  breast-feeding How should I use this medicine? This medicine is only for use on the skin. Follow the directions  on the prescription label. Wash hands before and after use. Wash affected area and gently pat dry. To apply this medicine use a cotton-tipped applicator, or use gloves if applying with fingertips. If applied with unprotected fingertips, it is very important to wash your hands well after you apply this medicine. Avoid applying to the eyes, nose, or mouth. Apply enough medicine to cover the affected area. You can cover the area with a light gauze dressing, but do not use tight or air-tight dressings. Finish the full course prescribed by your doctor or health care professional, even if you think your condition is better. Do not stop taking except on the advice of your doctor or health care professional. Talk to your pediatrician regarding the use of this medicine in children. Special care may be needed. Overdosage: If you think you have taken too much of this medicine contact a poison control center or emergency room at once. NOTE: This medicine is only for you. Do not share this medicine with others. What if I miss a dose? If you miss a dose, apply it as soon as you can. If it is almost time for your next dose, only use that dose. Do not apply extra doses. Contact your doctor or health care professional if you miss more than one dose. What may interact with this medicine? Interactions are not expected. Do not use any other skin products without telling your doctor or health care professional. This list may not  describe all possible interactions. Give your health care provider a list of all the medicines, herbs, non-prescription drugs, or dietary supplements you use. Also tell them if you smoke, drink alcohol, or use illegal drugs. Some items may interact with your medicine. What should I watch for while using this medicine? Visit your doctor or healthcare provider for checks on your progress. You will need to use this medicine for 2 to 6 weeks. This may be longer depending on the condition being treated. You may not see full healing for another 1 to 2 months after you stop using the medicine. This medicine may cause serious skin reactions. They can happen weeks to months after starting the medicine. Contact your healthcare provider right away if you notice fevers or flu-like symptoms with a rash. The rash may be red or purple and then turn into blisters or peeling of the skin. Or, you might notice a red rash with swelling of the face, lips or lymph nodes in your neck or under your arms. Treated areas of skin can look unsightly during and for several weeks after treatment with this medicine. This medicine can make you more sensitive to the sun. Keep out of the sun. If you cannot avoid being in the sun, wear protective clothing and use sunscreen. Do not use sun lamps or tanning beds/booths. If a pet comes in contact with the area where this medicine was applied to your skin or if it is ingested, they may have a serious risk of side effects. If accidental contact happens, the skin of the pet should be washed right away with soap and water. Contact your vet right away if your pet becomes exposed. Do not become pregnant while taking this medicine. Women should inform their doctor if they wish to become pregnant or think they might be pregnant. There is a potential for serious side effects to an unborn child. Talk to your healthcare provider or pharmacist for more information. What side effects may I notice from  receiving this medicine? Side  effects that you should report to your doctor or health care professional as soon as possible:  allergic reactions like skin rash, itching or hives, swelling of the face, lips, or tongue  black or bloody stools, blood in the urine or vomit  blurred vision  chest pain  difficulty breathing or wheezing  rash, fever, and swollen lymph nodes  redness, blistering, peeling or loosening of the skin, including inside the mouth  severe redness and swelling of normal skin  slurred speech or weakness on one side of the body  trouble passing urine or change in the amount of urine  unexplained weight gain or swelling  unusually weak or tired  yellowing of eyes or skin Side effects that usually do not require medical attention (report to your doctor or health care professional if they continue or are bothersome):  increased sensitivity of the skin to sun and ultraviolet light  pain and burning of the affected area  scaling or swelling of the affected area  skin rash, itching of the affected area  tenderness This list may not describe all possible side effects. Call your doctor for medical advice about side effects. You may report side effects to FDA at 1-800-FDA-1088. Where should I keep my medicine? Keep out of the reach of children and pets. Store at room temperature between 20 and 25 degrees C (68 and 77 degrees F). Throw away any unused medicine after the expiration date. NOTE: This sheet is a summary. It may not cover all possible information. If you have questions about this medicine, talk to your doctor, pharmacist, or health care provider.  2020 Elsevier/Gold Standard (2018-08-12 13:31:57)

## 2019-05-25 NOTE — Progress Notes (Signed)
Nutrition follow up completed with patient during infusion for pancreas cancer status post Whipple and partial nephrectomy. Patient's weight is stable at 193.7 pounds. She is receiving cycle 3 FOLFIRINOX today and will have a restaging CT after cycle 5. Noted labs: K 3.3 and Glucoses 150. Patient is eating well and is ready to increase more variety into her meals. Denies nutrition impact symptoms.  Nutrition Diagnosis: Unintended weight loss resolved.  Provided encouragement to continue adequate calories and protein for weight stability. Patient has my contact information for questions or concerns.  **Disclaimer: This note was dictated with voice recognition software. Similar sounding words can inadvertently be transcribed and this note may contain transcription errors which may not have been corrected upon publication of note.**

## 2019-05-25 NOTE — Progress Notes (Signed)
Reports numbness around her mouth, eyes twitching and cramping in legs before leaving last treatment appointment--self resolved.

## 2019-05-27 ENCOUNTER — Other Ambulatory Visit: Payer: Self-pay

## 2019-05-27 ENCOUNTER — Inpatient Hospital Stay: Payer: BC Managed Care – PPO

## 2019-05-27 VITALS — BP 122/79 | HR 80 | Temp 98.3°F | Resp 18

## 2019-05-27 DIAGNOSIS — Z85528 Personal history of other malignant neoplasm of kidney: Secondary | ICD-10-CM | POA: Diagnosis not present

## 2019-05-27 DIAGNOSIS — C250 Malignant neoplasm of head of pancreas: Secondary | ICD-10-CM

## 2019-05-27 DIAGNOSIS — C7A8 Other malignant neuroendocrine tumors: Secondary | ICD-10-CM | POA: Diagnosis not present

## 2019-05-27 DIAGNOSIS — C187 Malignant neoplasm of sigmoid colon: Secondary | ICD-10-CM | POA: Diagnosis not present

## 2019-05-27 DIAGNOSIS — Z5111 Encounter for antineoplastic chemotherapy: Secondary | ICD-10-CM | POA: Diagnosis not present

## 2019-05-27 DIAGNOSIS — E119 Type 2 diabetes mellitus without complications: Secondary | ICD-10-CM | POA: Diagnosis not present

## 2019-05-27 DIAGNOSIS — I10 Essential (primary) hypertension: Secondary | ICD-10-CM | POA: Diagnosis not present

## 2019-05-27 DIAGNOSIS — Z905 Acquired absence of kidney: Secondary | ICD-10-CM | POA: Diagnosis not present

## 2019-05-27 MED ORDER — SODIUM CHLORIDE 0.9% FLUSH
10.0000 mL | INTRAVENOUS | Status: DC | PRN
  Administered 2019-05-27: 10 mL
  Filled 2019-05-27: qty 10

## 2019-05-27 MED ORDER — HEPARIN SOD (PORK) LOCK FLUSH 100 UNIT/ML IV SOLN
500.0000 [IU] | Freq: Once | INTRAVENOUS | Status: AC | PRN
  Administered 2019-05-27: 500 [IU]
  Filled 2019-05-27: qty 5

## 2019-05-28 ENCOUNTER — Other Ambulatory Visit: Payer: Self-pay

## 2019-05-30 NOTE — Progress Notes (Signed)
Patient ID: Alyssa Ross, female   DOB: 28-Feb-1966, 53 y.o.   MRN: [id number]    Reason for Appointment: Diabetes follow-up    History of Present Illness   Diagnosis: Type 2 DIABETES MELITUS, date of diagnosis 2005   She had previously been on metformin and Amaryl Because of inadequate control she was given Victoza in addition in 2011 With this her blood sugars have been significantly better and her A1c has been as low as 6.2 in 2013 Her blood sugars have been more difficult to control since 2014 an A1c consistently over 7% She  was started on insulin with Levemir in 06/2013 because of  increase in her A1c to 8.5% She had previously been taking Victoza along with Amaryl and metformin without consistent control Also had difficulty losing weight despite taking 1.8 mg Victoza, also had tried the 3 mg dose Because of episodic nausea and diarrhea in 1/16 she went off her Victoza  RECENT history:   INSULIN dose: TRESIBA 15-20  units pm.  Humalog recently irregular  Non-insulin hypoglycemic drugs: on metformin 2250 mg,  A1c previously was 8.3, no recent labs available   Current management, blood sugars and problems identified as follows  She was having significant weight loss and relatively low readings in October after her surgery  However blood sugars appear to be progressively higher  Although she does get steroid injection with her chemotherapy every other week on Tuesdays do not see any consistent change in her blood sugars on that day in the last 2 weeks  She appears to have persistently high sugars most of the time although is not checking her blood sugars regularly on a daily basis  She has not been committed to taking her Tyler Aas regularly and can sometimes take it during the night and possibly miss it altogether  She claims that Humalog makes her sugar get low the next morning but she does not take it after 9-10 PM  As before she has mostly high readings after  meals with highest average blood sugar 277 after dinner  Has not started on any exercise     Side effects from medications: None   Monitors blood glucose: With freestyle Libre sensor   CONTINUOUS GLUCOSE MONITORING RECORD INTERPRETATION    Dates of Recording: 12/8 through 12/21  Sensor description: [employer]  Results statistics:   CGM use % of time  64  Average and SD  204, GV 32  Time in range       34%  % Time Above 180  4 3   % Time above 250  21  % Time Below target  2    PRE-MEAL Fasting Lunch Dinner Bedtime Overall  Glucose range:       Mean/median:  134   218  251  204   POST-MEAL PC Breakfast PC Lunch PC Dinner  Glucose range:     Mean/median:  162  200  277    Glycemic patterns summary: She shows markedly increased blood sugars most of the time except late mornings testing she has incomplete data on some days, frequency of monitoring is between 1-4 times She has very consistently high readings starting late afternoon into late at night Blood sugars may improve to a variable extent late morning including readings as low as 65  Hyperglycemic episodes are occurring starting after lunch and continuing until after dinnertime into the night   Hypoglycemic episodes are minimal with only once blood sugar being low normal in  late morning  Overnight periods: Blood sugars are markedly increased late at night averaging about 230 and only declining after about 6 AM  Preprandial periods: As above blood sugars are variably high in the mornings still averaging only about 135 Blood sugars are averaging over 200 at dinnertime which is at various times  Postprandial periods: Blood sugars are rising significantly after her first meal around noontime and then further increasing later in the evening   Previous readings:  PRE-MEAL Fasting  midday Dinner Bedtime Overall  Glucose range:  58-162  62-219  114  183   Mean/median:     ?   POST-MEAL PC Breakfast PC Lunch PC  Dinner  Glucose range:    179  Mean/median:         Meals: 3 meals per day. Dinner 8-9 pm     DIET: As above  Physical activity: none         Wt Readings from Last 3 Encounters:  05/31/19 189 lb 3.2 oz (85.8 kg)  05/25/19 193 lb 11.2 oz (87.9 kg)  05/11/19 192 lb 8 oz (87.3 kg)    LABS:  Lab Results  Component Value Date   HGBA1C 8.3 (H) 04/02/2019   HGBA1C 8.9 (H) 03/02/2019   HGBA1C 9.2 (H) 11/12/2018   Lab Results  Component Value Date   MICROALBUR 30 12/01/2018   LDLCALC 63 07/11/2018   CREATININE 0.85 05/25/2019    Appointment on 05/25/2019  Component Date Value Ref Range Status  . WBC Count 05/25/2019 5.2  4.0 - 10.5 K/uL Final  . RBC 05/25/2019 3.55* 3.87 - 5.11 MIL/uL Final  . Hemoglobin 05/25/2019 10.0* 12.0 - 15.0 g/dL Final  . HCT 05/25/2019 30.8* 36.0 - 46.0 % Final  . MCV 05/25/2019 86.8  80.0 - 100.0 fL Final  . MCH 05/25/2019 28.2  26.0 - 34.0 pg Final  . MCHC 05/25/2019 32.5  30.0 - 36.0 g/dL Final  . RDW 05/25/2019 14.3  11.5 - 15.5 % Final  . Platelet Count 05/25/2019 264  150 - 400 K/uL Final  . nRBC 05/25/2019 0.0  0.0 - 0.2 % Final  . Neutrophils Relative % 05/25/2019 44  % Final  . Neutro Abs 05/25/2019 2.3  1.7 - 7.7 K/uL Final  . Lymphocytes Relative 05/25/2019 44  % Final  . Lymphs Abs 05/25/2019 2.3  0.7 - 4.0 K/uL Final  . Monocytes Relative 05/25/2019 10  % Final  . Monocytes Absolute 05/25/2019 0.5  0.1 - 1.0 K/uL Final  . Eosinophils Relative 05/25/2019 1  % Final  . Eosinophils Absolute 05/25/2019 0.0  0.0 - 0.5 K/uL Final  . Basophils Relative 05/25/2019 1  % Final  . Basophils Absolute 05/25/2019 0.0  0.0 - 0.1 K/uL Final  . Immature Granulocytes 05/25/2019 0  % Final  . Abs Immature Granulocytes 05/25/2019 0.00  0.00 - 0.07 K/uL Final   Performed at [facility], [place] [address] [place], [place] [address]  . Sodium 05/25/2019 142  135 - 145 mmol/L Final  . Potassium 05/25/2019 3.3* 3.5 - 5.1 mmol/L  Final  . Chloride 05/25/2019 105  98 - 111 mmol/L Final  . CO2 05/25/2019 26  22 - 32 mmol/L Final  . Glucose, Bld 05/25/2019 150* 70 - 99 mg/dL Final  . BUN 05/25/2019 12  6 - 20 mg/dL Final  . Creatinine 05/25/2019 0.85  0.44 - 1.00 mg/dL Final  . Calcium 05/25/2019 9.1  8.9 - 10.3 mg/dL Final  . Total  Protein 05/25/2019 6.9  6.5 - 8.1 g/dL Final  . Albumin 05/25/2019 3.6  3.5 - 5.0 g/dL Final  . AST 05/25/2019 22  15 - 41 U/L Final  . ALT 05/25/2019 34  0 - 44 U/L Final  . Alkaline Phosphatase 05/25/2019 77  38 - 126 U/L Final  . Total Bilirubin 05/25/2019 0.3  0.3 - 1.2 mg/dL Final  . GFR, Est Non Af Am 05/25/2019 >60  >60 mL/min Final  . GFR, Est AFR Am 05/25/2019 >60  >60 mL/min Final  . Anion gap 05/25/2019 11  5 - 15 Final   Performed at [facility], [name] [address], [place], [place] [address]    Allergies as of 05/31/2019      Reactions   Cherry Rash   Lemon Oil Rash      Medication List       Accurate as of May 31, 2019 12:05 PM. If you have any questions, ask your nurse or doctor.        baclofen 10 MG tablet Commonly known as: LIORESAL Take 1 tablet (10 mg total) by mouth 3 (three) times daily as needed for muscle spasms.   FreeStyle Libre 14 Day Reader Kerrin Mo USE READER TO CHECK BLOOD GLUCOSE WITH FREESTYLE LIBRE SENSORS.   FreeStyle Libre 14 Day Sensor Misc APPLY SENSOR TO BODY ONCE EVERY 14 DAYS TO MONITOR BLOOD GLUCOSE   insulin lispro 100 UNIT/ML KwikPen Commonly known as: HumaLOG KwikPen Inject 20 units under the skin three times daily before meals. What changed:   how much to take  how to take this  when to take this  reasons to take this  additional instructions   Insulin Pen Needle 32G X 4 MM Misc Commonly known as: NovoFine Plus Use 3 per day to inject insulin   Klor-Con M20 20 MEQ tablet Generic drug: potassium chloride SA TAKE 1 TABLET DAILY   lidocaine-prilocaine cream Commonly known as: EMLA Apply  1 application topically as directed. Apply 1 hour prior to stick and cover with plastic wrap   lisinopril-hydrochlorothiazide 10-12.5 MG tablet Commonly known as: ZESTORETIC Take 0.5 tablets by mouth daily as needed (high blood pressure).   loratadine 10 MG tablet Commonly known as: CLARITIN Take 10 mg by mouth at bedtime.   metFORMIN 750 MG 24 hr tablet Commonly known as: GLUCOPHAGE-XR TAKE 3 TABLETS DAILY   ondansetron 8 MG tablet Commonly known as: ZOFRAN Take 1 tablet (8 mg total) by mouth every 8 (eight) hours as needed for nausea or vomiting. Start 72 hours after chemo treatment   oxyCODONE 5 MG immediate release tablet Commonly known as: Oxy IR/ROXICODONE Take 1-2 tablets (5-10 mg total) by mouth every 4 (four) hours as needed for moderate pain, severe pain or breakthrough pain.   pantoprazole 40 MG tablet Commonly known as: PROTONIX Take 1 tablet (40 mg total) by mouth daily at 12 noon.   prochlorperazine 10 MG tablet Commonly known as: COMPAZINE Take 1 tablet (10 mg total) by mouth every 6 (six) hours as needed.   traMADol 50 MG tablet Commonly known as: ULTRAM Take 1-2 tablets (50-100 mg total) by mouth every 6 (six) hours as needed for moderate pain or severe pain.   TRESIBA FLEXTOUCH [place] Inject 14-60 Units into the skin at bedtime.       Allergies:  Allergies  Allergen Reactions  . Cherry Rash  . Lemon Oil Rash    Past Medical History:  Diagnosis Date  . Anemia   .  ASCUS (atypical squamous cells of undetermined significance) on Pap smear 08/06/1999  . Breast mass in female 2002   Left  . Cancer [facility])    Neuroendocrine Tumor of the pancreas  . Diabetes mellitus without complication ([place])   . Family history of pancreatic cancer   . Fibroid uterus 2010  . Hypertension   . Irregular bleeding 2011  . 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 PONV (postoperative nausea and vomiting)    nausea  vomitting after 12/25/18 ERCP  .  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 Yeast vaginitis 2006    Past Surgical History:  Procedure Laterality Date  . BILIARY STENT PLACEMENT N/A 12/25/2018   Procedure: BILIARY STENT PLACEMENT;  Surgeon: Carol Ada, MD;  Location: [facility];  Service: Endoscopy;  Laterality: N/A;  . BILIARY STENT PLACEMENT N/A 01/01/2019   Procedure: BILIARY STENT PLACEMENT;  Surgeon: Carol Ada, MD;  Location: [facility];  Service: Endoscopy;  Laterality: N/A;  . DILATATION & CURETTAGE/HYSTEROSCOPY WITH TRUECLEAR N/A 01/20/2014   Procedure: DILATATION & CURETTAGE/HYSTEROSCOPY WITH TRUCLEAR;  Surgeon: [name], MD;  Location: [facility];  Service: Gynecology;  Laterality: N/A;  . DILATATION & CURRETTAGE/HYSTEROSCOPY WITH RESECTOCOPE N/A 01/20/2014   Procedure: [place];  Surgeon: [name], MD;  Location: [facility];  Service: Gynecology;  Laterality: N/A;  . ENDOSCOPIC RETROGRADE CHOLANGIOPANCREATOGRAPHY (ERCP) WITH PROPOFOL N/A 12/25/2018   Procedure: ENDOSCOPIC RETROGRADE CHOLANGIOPANCREATOGRAPHY (ERCP) WITH PROPOFOL;  Surgeon: Carol Ada, MD;  Location: [facility];  Service: Endoscopy;  Laterality: N/A;  . ENDOSCOPIC RETROGRADE CHOLANGIOPANCREATOGRAPHY (ERCP) WITH PROPOFOL N/A 01/01/2019   Procedure: ENDOSCOPIC RETROGRADE CHOLANGIOPANCREATOGRAPHY (ERCP) WITH PROPOFOL;  Surgeon: Carol Ada, MD;  Location: [facility];  Service: Endoscopy;  Laterality: N/A;  . ERCP  12/25/2018  . FINE NEEDLE ASPIRATION N/A 01/01/2019   Procedure: FINE NEEDLE ASPIRATION (FNA) LINEAR;  Surgeon: Carol Ada, MD;  Location: [facility];  Service: Endoscopy;  Laterality: N/A;  . LAPAROSCOPY N/A 03/04/2019   Procedure: LAPAROSCOPY DIAGNOSTIC;  Surgeon: Stark Klein, MD;  Location: [facility];  Service: General;  Laterality: N/A;  GENERAL AND EPIDURAL  . NO PAST SURGERIES    . PARTIAL NEPHRECTOMY Right 03/04/2019   Procedure: Nephrectomy Partial;  Surgeon:  Cleon Gustin, MD;  Location: [facility];  Service: Urology;  Laterality: Right;  . PORTACATH PLACEMENT Left 04/21/2019   Procedure: INSERTION PORT-A-CATH;  Surgeon: Stark Klein, MD;  Location: [facility];  Service: General;  Laterality: Left;  . SPHINCTEROTOMY  12/25/2018   Procedure: SPHINCTEROTOMY;  Surgeon: Carol Ada, MD;  Location: [facility];  Service: Endoscopy;;  . Lavell Islam REMOVAL  01/01/2019   Procedure: STENT REMOVAL;  Surgeon: Carol Ada, MD;  Location: [facility];  Service: Endoscopy;;  . UPPER ESOPHAGEAL ENDOSCOPIC ULTRASOUND (EUS) N/A 01/01/2019   Procedure: UPPER ESOPHAGEAL ENDOSCOPIC ULTRASOUND (EUS);  Surgeon: Carol Ada, MD;  Location: [facility];  Service: Endoscopy;  Laterality: N/A;  . WHIPPLE PROCEDURE N/A 03/04/2019   Procedure: WHIPPLE PROCEDURE;  Surgeon: Stark Klein, MD;  [facility]: [facility];  Service: General;  Laterality: N/A;  GENERAL AND EPIDURAL    Family History  Problem Relation Age of Onset  . Diabetes Mother   . Dementia Mother   . Hyperlipidemia Mother   . Hypertension Mother   . Irregular heart beat Mother   . Diabetes Father   . Hypertension Father   . Hyperlipidemia Father   . Down syndrome Sister   . Diabetes Sister   . Hyperlipidemia Brother   .  Heart Problems Brother   . Goiter Maternal Aunt   . Thyroid nodules Sister   . Cancer Cousin 60       eye; maternal first cousin  . Goiter Cousin   . Cancer Paternal Aunt        unknown form of cancer  . Cancer Cousin        unknown form of cancer; paternal first cousin  . Pancreatic cancer Cousin 2       paternal first cousin  . Cancer Cousin 78       unknown cancer; paternal first cousin  . Cancer Cousin 55       unknown cancer; paternal first cousin    Social History:  reports that she has never smoked. She has never used smokeless tobacco. She reports current alcohol use. She reports that she does not use drugs.  REVIEW of systems:   She is being followed by her PCP for  hypertension She is on her regimen with Zestoretic 20 mg again   BP Readings from Last 3 Encounters:  05/31/19 134/70  05/27/19 122/79  05/25/19 (!) 133/92     Has had hypercholesterolemia, was taking Crestor 20 mg with good control, has history of mild increase in triglycerides also She has been told by PCP not to take any Crestor  Labs as follows  Lab Results  Component Value Date   CHOL 129 07/11/2018   HDL 52 07/11/2018   LDLCALC 63 07/11/2018   LDLDIRECT 97.7 02/11/2014   TRIG 69 07/11/2018   CHOLHDL 2.5 07/11/2018      Examination:   BP 134/70 (BP Location: Left Arm, Patient Position: Sitting, Cuff Size: Normal)   Pulse (!) 107   Ht 5\' 8"  (1.727 m)   Wt 189 lb 3.2 oz (85.8 kg)   LMP 09/17/2012   SpO2 98%   BMI 28.77 kg/m   Body mass index is 28.77 kg/m.    Assessment/Plan:   Diabetes type 2, uncontrolled  See history of present illness for detailed discussion of her current management, blood sugar patterns and problems identified  Her A1c is usually over 8% and last 8.3  Her blood sugars are progressively getting higher and averaging 200 with only 34% of her readings within the target range of 70-180  She has very little concept about differences between basal and bolus insulins and how to dose the 2 types of insulin Explained to her that she needs to be insulin-dependent completely and need to cover all meals with carbohydrate with bolus insulin and take adequate basal insulin based on her fasting blood sugar patterns Currently not exercising also Diet is somewhat inconsistent and mealtimes are very variable  Recommendations:  Take a consistent amount of Tresiba and for better compliance take it at dinnertime daily  She will start with 15 units and adjust the dose every 3 days by 2 to 3 units until morning sugars are under 140 consistently  Likely she will need at least 5 to 10 units more on the day she has her chemotherapy  Explained to her that  Humalog will not cause overnight hypoglycemia if taken only before meals  For better adjustment she will use carbohydrate counting to calculate her insulin dose based on calorie [employer] Pace app and nutritional information on foods  She will start with 1: 10 coverage and explained to her how this is calculated  Also needs 1: 50 correction factor before the meal if blood sugar is over 150  She will triple the  correction factor for days on steroids  Continue Metformin  Start walking regularly for exercise  HYPERTENSION: Back on treatment and pressure is controlled  Needs follow-up A1c on the next visit  Follow-up in 2 months  Patient Instructions  Humalog at each meal unless not eating carbs  Calorie [employer] P[employer] app  Divide Carbs by 10 for each meal  Extra Humalog if over 150, 1Unit per 50mg    Take 3x correction  doses on Chemo  Tresiba 15 at dinner and go up if am sugar stays > 140     Counseling time on subjects discussed in assessment and plan sections is over 50% of today's 25 minute visit   Elayne Snare 05/31/2019, 12:05 PM   Note: This office note was prepared with Dragon voice recognition system technology. Any transcriptional errors that result from this process are unintentional.

## 2019-05-31 ENCOUNTER — Ambulatory Visit: Payer: BC Managed Care – PPO | Admitting: Endocrinology

## 2019-05-31 ENCOUNTER — Other Ambulatory Visit: Payer: Self-pay

## 2019-05-31 ENCOUNTER — Encounter: Payer: Self-pay | Admitting: Endocrinology

## 2019-05-31 VITALS — BP 134/70 | HR 107 | Ht 68.0 in | Wt 189.2 lb

## 2019-05-31 DIAGNOSIS — E1165 Type 2 diabetes mellitus with hyperglycemia: Secondary | ICD-10-CM

## 2019-05-31 DIAGNOSIS — Z794 Long term (current) use of insulin: Secondary | ICD-10-CM | POA: Diagnosis not present

## 2019-05-31 NOTE — Patient Instructions (Addendum)
Humalog at each meal unless not eating carbs  Calorie [employer] app  Divide Carbs by 10 for each meal  Extra Humalog if over 150, 1Unit per 50mg    Take 3x correction  doses on Chemo  Tresiba 15 at dinner and go up if am sugar stays > 140

## 2019-06-01 LAB — FRUCTOSAMINE: Fructosamine: 334 umol/L — ABNORMAL HIGH (ref 0–285)

## 2019-06-06 ENCOUNTER — Other Ambulatory Visit: Payer: Self-pay | Admitting: Oncology

## 2019-06-08 ENCOUNTER — Other Ambulatory Visit: Payer: Self-pay

## 2019-06-08 ENCOUNTER — Telehealth: Payer: Self-pay | Admitting: *Deleted

## 2019-06-08 ENCOUNTER — Inpatient Hospital Stay (HOSPITAL_BASED_OUTPATIENT_CLINIC_OR_DEPARTMENT_OTHER): Payer: BC Managed Care – PPO | Admitting: Oncology

## 2019-06-08 ENCOUNTER — Inpatient Hospital Stay: Payer: BC Managed Care – PPO

## 2019-06-08 VITALS — BP 133/92 | HR 89 | Temp 97.5°F | Resp 18 | Ht 68.0 in | Wt 195.6 lb

## 2019-06-08 DIAGNOSIS — Z95828 Presence of other vascular implants and grafts: Secondary | ICD-10-CM

## 2019-06-08 DIAGNOSIS — C187 Malignant neoplasm of sigmoid colon: Secondary | ICD-10-CM | POA: Diagnosis not present

## 2019-06-08 DIAGNOSIS — I10 Essential (primary) hypertension: Secondary | ICD-10-CM | POA: Diagnosis not present

## 2019-06-08 DIAGNOSIS — Z905 Acquired absence of kidney: Secondary | ICD-10-CM | POA: Diagnosis not present

## 2019-06-08 DIAGNOSIS — C250 Malignant neoplasm of head of pancreas: Secondary | ICD-10-CM

## 2019-06-08 DIAGNOSIS — Z5111 Encounter for antineoplastic chemotherapy: Secondary | ICD-10-CM | POA: Diagnosis not present

## 2019-06-08 DIAGNOSIS — Z85528 Personal history of other malignant neoplasm of kidney: Secondary | ICD-10-CM | POA: Diagnosis not present

## 2019-06-08 DIAGNOSIS — C7A8 Other malignant neuroendocrine tumors: Secondary | ICD-10-CM | POA: Diagnosis not present

## 2019-06-08 DIAGNOSIS — E119 Type 2 diabetes mellitus without complications: Secondary | ICD-10-CM | POA: Diagnosis not present

## 2019-06-08 LAB — CMP (CANCER CENTER ONLY)
ALT: 38 U/L (ref 0–44)
AST: 36 U/L (ref 15–41)
Albumin: 3.5 g/dL (ref 3.5–5.0)
Alkaline Phosphatase: 84 U/L (ref 38–126)
Anion gap: 11 (ref 5–15)
BUN: 13 mg/dL (ref 6–20)
CO2: 25 mmol/L (ref 22–32)
Calcium: 9 mg/dL (ref 8.9–10.3)
Chloride: 107 mmol/L (ref 98–111)
Creatinine: 0.81 mg/dL (ref 0.44–1.00)
GFR, Est AFR Am: >60 mL/min (ref >60)
GFR, Estimated: >60 mL/min (ref >60)
Glucose, Bld: 126 mg/dL — ABNORMAL HIGH (ref 70–99)
Potassium: 3.4 mmol/L — ABNORMAL LOW (ref 3.5–5.1)
Sodium: 143 mmol/L (ref 135–145)
Total Bilirubin: 0.3 mg/dL (ref 0.3–1.2)
Total Protein: 6.9 g/dL (ref 6.5–8.1)

## 2019-06-08 LAB — CBC WITH DIFFERENTIAL (CANCER CENTER ONLY)
Abs Immature Granulocytes: 0.01 10*3/uL (ref 0.00–0.07)
Basophils Absolute: 0 10*3/uL (ref 0.0–0.1)
Basophils Relative: 0 %
Eosinophils Absolute: 0.1 10*3/uL (ref 0.0–0.5)
Eosinophils Relative: 1 %
HCT: 31.3 % — ABNORMAL LOW (ref 36.0–46.0)
Hemoglobin: 10 g/dL — ABNORMAL LOW (ref 12.0–15.0)
Immature Granulocytes: 0 %
Lymphocytes Relative: 43 %
Lymphs Abs: 2 10*3/uL (ref 0.7–4.0)
MCH: 28.1 pg (ref 26.0–34.0)
MCHC: 31.9 g/dL (ref 30.0–36.0)
MCV: 87.9 fL (ref 80.0–100.0)
Monocytes Absolute: 0.4 10*3/uL (ref 0.1–1.0)
Monocytes Relative: 9 %
Neutro Abs: 2.1 10*3/uL (ref 1.7–7.7)
Neutrophils Relative %: 47 %
Platelet Count: 330 10*3/uL (ref 150–400)
RBC: 3.56 MIL/uL — ABNORMAL LOW (ref 3.87–5.11)
RDW: 15.3 % (ref 11.5–15.5)
WBC Count: 4.6 10*3/uL (ref 4.0–10.5)
nRBC: 0 % (ref 0.0–0.2)

## 2019-06-08 LAB — MAGNESIUM: Magnesium: 1.5 mg/dL — ABNORMAL LOW (ref 1.7–2.4)

## 2019-06-08 MED ORDER — OXALIPLATIN CHEMO INJECTION 100 MG/20ML
85.0000 mg/m2 | Freq: Once | INTRAVENOUS | Status: AC
  Administered 2019-06-08: 12:00:00 175 mg via INTRAVENOUS
  Filled 2019-06-08: qty 35

## 2019-06-08 MED ORDER — SODIUM CHLORIDE 0.9 % IV SOLN
2000.0000 mg/m2 | INTRAVENOUS | Status: DC
  Administered 2019-06-08: 4100 mg via INTRAVENOUS
  Filled 2019-06-08: qty 82

## 2019-06-08 MED ORDER — PALONOSETRON HCL INJECTION 0.25 MG/5ML
0.2500 mg | Freq: Once | INTRAVENOUS | Status: AC
  Administered 2019-06-08: 0.25 mg via INTRAVENOUS

## 2019-06-08 MED ORDER — MAGNESIUM OXIDE 400 (241.3 MG) MG PO TABS: 400.0000 mg | ORAL_TABLET | Freq: Two times a day (BID) | ORAL | 2 refills | Status: DC

## 2019-06-08 MED ORDER — SODIUM CHLORIDE 0.9 % IV SOLN
Freq: Once | INTRAVENOUS | Status: AC
  Filled 2019-06-08: qty 5

## 2019-06-08 MED ORDER — SODIUM CHLORIDE 0.9% FLUSH
10.0000 mL | Freq: Once | INTRAVENOUS | Status: AC
  Administered 2019-06-08: 10 mL
  Filled 2019-06-08: qty 10

## 2019-06-08 MED ORDER — DEXTROSE 5 % IV SOLN
Freq: Once | INTRAVENOUS | Status: AC
  Filled 2019-06-08: qty 250

## 2019-06-08 MED ORDER — ATROPINE SULFATE 0.4 MG/ML IJ SOLN
0.4000 mg | Freq: Once | INTRAMUSCULAR | Status: AC | PRN
  Administered 2019-06-08: 14:00:00 0.4 mg via INTRAVENOUS

## 2019-06-08 MED ORDER — HEPARIN SOD (PORK) LOCK FLUSH 100 UNIT/ML IV SOLN
500.0000 [IU] | Freq: Once | INTRAVENOUS | Status: DC | PRN
  Filled 2019-06-08: qty 5

## 2019-06-08 MED ORDER — ATROPINE SULFATE 1 MG/ML IJ SOLN: 0.5000 mg | Freq: Once | INTRAMUSCULAR | Status: DC | PRN

## 2019-06-08 MED ORDER — LEUCOVORIN CALCIUM INJECTION 350 MG
400.0000 mg/m2 | Freq: Once | INTRAVENOUS | Status: AC
  Administered 2019-06-08: 824 mg via INTRAVENOUS
  Filled 2019-06-08: qty 41.2

## 2019-06-08 MED ORDER — IRINOTECAN HCL CHEMO INJECTION 100 MG/5ML
150.0000 mg/m2 | Freq: Once | INTRAVENOUS | Status: AC
  Administered 2019-06-08: 14:00:00 300 mg via INTRAVENOUS
  Filled 2019-06-08: qty 15

## 2019-06-08 MED ORDER — SODIUM CHLORIDE 0.9% FLUSH
10.0000 mL | INTRAVENOUS | Status: DC | PRN
  Filled 2019-06-08: qty 10

## 2019-06-08 NOTE — Progress Notes (Signed)
  [facility] OFFICE PROGRESS NOTE   Diagnosis: Pancreas cancer  INTERVAL HISTORY:   Alyssa Ross completed another cycle of FOLFIRINOX on 05/25/1999 no nausea/vomiting or mouth sores.  She had cold sensitivity and stiffness in the hands lasting for several days following chemotherapy.  She continues to have cold sensitivity.  No peripheral numbness.  The cold sensitivity does not interfere with activity.  She had one episode of diarrhea.  She has noted hyperpigmentation of the tongue.  She plans to return to work next week.  Objective:  Vital signs in last 24 hours:  Blood pressure (!) 133/92, pulse 89, temperature (!) 97.5 F (36.4 C), temperature source Temporal, resp. rate 18, height 5\' 8"  (1.727 m), weight 195 lb 9.6 oz (88.7 kg), last menstrual period 09/17/2012, SpO2 100 %.    HEENT: Hyperpigmentation of the tongue, no thrush or ulcers GI: Tender, no hepatomegaly, no mass Vascular: No leg edema Neuro: Mild loss of vibratory sense at the fingertips bilaterally Skin: Mild hyperpigmentation of the hands  Portacath/PICC-without erythema  Lab Results:  Lab Results  Component Value Date   WBC 4.6 06/08/2019   HGB 10.0 (L) 06/08/2019   HCT 31.3 (L) 06/08/2019   MCV 87.9 06/08/2019   PLT 330 06/08/2019   NEUTROABS 2.1 06/08/2019    CMP  Lab Results  Component Value Date   NA 143 06/08/2019   K 3.4 (L) 06/08/2019   CL 107 06/08/2019   CO2 25 06/08/2019   GLUCOSE 126 (H) 06/08/2019   BUN 13 06/08/2019   CREATININE 0.81 06/08/2019   CALCIUM 9.0 06/08/2019   PROT 6.9 06/08/2019   ALBUMIN 3.5 06/08/2019   AST 36 06/08/2019   ALT 38 06/08/2019   ALKPHOS 84 06/08/2019   BILITOT 0.3 06/08/2019   GFRNONAA >60 06/08/2019   GFRAA >60 06/08/2019     Medications: I have reviewed the patient's current medications.   Assessment/Plan: 1. 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 Tumor invades the duodenal wall  and vascular groove, resection margins negative, 4/34 lymph nodes positive ? 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 CTs 01/29/2019-ill-defined pancreas head mass, 5 pulmonary nodules-1 with a small amount of central cavitation, tumor abuts the left margin of the portal vein indistinct density surrounding, hepatic artery, complex cystic lesion of the right kidney, right adrenal mass-characterized as an adenoma on a [facility] MRI 12/21/2018 ? 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 Elevated preoperative CA 19-9 ? CT chest 04/16/2019-multiple bilateral pulmonary nodules, some with increased cavitation, stable in size ? Cycle 1 FOLFIRINOX 04/27/2019 ? Cycle 2 FOLFIRINOX 05/11/2019 ? Cycle 3 FOLFIRINOX 05/23/2019 ? Cycle 4 FOLFIRINOX 06/08/2019  2. 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 Diabetes 4. Hypertension 5. Family history of pancreas cancer 6. Port-A-Cath placement, Dr. Barry Dienes, 04/21/2019 7. Oxaliplatin neuropathy     Disposition: Alyssa Ross appears stable.  She continues to tolerate the FOLFIRINOX well.  She will complete cycle 4 today.  She has developed mild oxaliplatin neuropathy.  We will monitor for progression of neuropathy.  Alyssa Ross will return for an office visit and chemotherapy in 2 weeks.  The plan is to repeat a chest CT after cycle 5 FOLFIRINOX.  [name], MD  06/08/2019  10:16 AM

## 2019-06-08 NOTE — Patient Instructions (Signed)
[facility] Discharge Instructions for Patients Receiving Chemotherapy  Today you received the following chemotherapy agents: Oxaliplatin, leucovorin, Irinotecan, 5FU  To help prevent nausea and vomiting after your treatment, we encourage you to take your nausea medication as directed.   If you develop nausea and vomiting that is not controlled by your nausea medication, call the clinic.   BELOW ARE SYMPTOMS THAT SHOULD BE REPORTED IMMEDIATELY:  *FEVER GREATER THAN 100.5 F  *CHILLS WITH OR WITHOUT FEVER  NAUSEA AND VOMITING THAT IS NOT CONTROLLED WITH YOUR NAUSEA MEDICATION  *UNUSUAL SHORTNESS OF BREATH  *UNUSUAL BRUISING OR BLEEDING  TENDERNESS IN MOUTH AND THROAT WITH OR WITHOUT PRESENCE OF ULCERS  *URINARY PROBLEMS  *BOWEL PROBLEMS  UNUSUAL RASH Items with * indicate a potential emergency and should be followed up as soon as possible.  Feel free to call the clinic should you have any questions or concerns. The clinic phone number is [phone].  Please show the [place] at check-in to the Emergency Department and triage nurse.  Oxaliplatin Injection What is this medicine? OXALIPLATIN (ox AL i PLA tin) is a chemotherapy drug. It targets fast dividing cells, like cancer cells, and causes these cells to die. This medicine is used to treat cancers of the colon and rectum, and many other cancers. This medicine may be used for other purposes; ask your health care provider or pharmacist if you have questions. COMMON BRAND NAME(S): Eloxatin What should I tell my health care provider before I take this medicine? They need to know if you have any of these conditions:  kidney disease  an unusual or allergic reaction to oxaliplatin, other chemotherapy, other medicines, foods, dyes, or preservatives  pregnant or trying to get pregnant  breast-feeding How should I use this medicine? This drug is given as an infusion into a vein. It is administered in a  hospital or clinic by a specially trained health care professional. Talk to your pediatrician regarding the use of this medicine in children. Special care may be needed. Overdosage: If you think you have taken too much of this medicine contact a poison control center or emergency room at once. NOTE: This medicine is only for you. Do not share this medicine with others. What if I miss a dose? It is important not to miss a dose. Call your doctor or health care professional if you are unable to keep an appointment. What may interact with this medicine?  medicines to increase blood counts like filgrastim, pegfilgrastim, sargramostim  probenecid  some antibiotics like amikacin, gentamicin, neomycin, polymyxin B, streptomycin, tobramycin  zalcitabine Talk to your doctor or health care professional before taking any of these medicines:  acetaminophen  aspirin  ibuprofen  ketoprofen  naproxen This list may not describe all possible interactions. Give your health care provider a list of all the medicines, herbs, non-prescription drugs, or dietary supplements you use. Also tell them if you smoke, drink alcohol, or use illegal drugs. Some items may interact with your medicine. What should I watch for while using this medicine? Your condition will be monitored carefully while you are receiving this medicine. You will need important blood work done while you are taking this medicine. This medicine can make you more sensitive to cold. Do not drink cold drinks or use ice. Cover exposed skin before coming in contact with cold temperatures or cold objects. When out in cold weather wear warm clothing and cover your mouth and nose to warm the air that goes  into your lungs. Tell your doctor if you get sensitive to the cold. This drug may make you feel generally unwell. This is not uncommon, as chemotherapy can affect healthy cells as well as cancer cells. Report any side effects. Continue your course of  treatment even though you feel ill unless your doctor tells you to stop. In some cases, you may be given additional medicines to help with side effects. Follow all directions for their use. Call your doctor or health care professional for advice if you get a fever, chills or sore throat, or other symptoms of a cold or flu. Do not treat yourself. This drug decreases your body's ability to fight infections. Try to avoid being around people who are sick. This medicine may increase your risk to bruise or bleed. Call your doctor or health care professional if you notice any unusual bleeding. Be careful brushing and flossing your teeth or using a toothpick because you may get an infection or bleed more easily. If you have any dental work done, tell your dentist you are receiving this medicine. Avoid taking products that contain aspirin, acetaminophen, ibuprofen, naproxen, or ketoprofen unless instructed by your doctor. These medicines may hide a fever. Do not become pregnant while taking this medicine. Women should inform their doctor if they wish to become pregnant or think they might be pregnant. There is a potential for serious side effects to an unborn child. Talk to your health care professional or pharmacist for more information. Do not breast-feed an infant while taking this medicine. Call your doctor or health care professional if you get diarrhea. Do not treat yourself. What side effects may I notice from receiving this medicine? Side effects that you should report to your doctor or health care professional as soon as possible:  allergic reactions like skin rash, itching or hives, swelling of the face, lips, or tongue  low blood counts - This drug may decrease the number of white blood cells, red blood cells and platelets. You may be at increased risk for infections and bleeding.  signs of infection - fever or chills, cough, sore throat, pain or difficulty passing urine  signs of decreased  platelets or bleeding - bruising, pinpoint red spots on the skin, black, tarry stools, nosebleeds  signs of decreased red blood cells - unusually weak or tired, fainting spells, lightheadedness  breathing problems  chest pain, pressure  cough  diarrhea  jaw tightness  mouth sores  nausea and vomiting  pain, swelling, redness or irritation at the injection site  pain, tingling, numbness in the hands or feet  problems with balance, talking, walking  redness, blistering, peeling or loosening of the skin, including inside the mouth  trouble passing urine or change in the amount of urine Side effects that usually do not require medical attention (report to your doctor or health care professional if they continue or are bothersome):  changes in vision  constipation  hair loss  loss of appetite  metallic taste in the mouth or changes in taste  stomach pain This list may not describe all possible side effects. Call your doctor for medical advice about side effects. You may report side effects to FDA at 1-800-FDA-1088. Where should I keep my medicine? This drug is given in a hospital or clinic and will not be stored at home. NOTE: This sheet is a summary. It may not cover all possible information. If you have questions about this medicine, talk to your doctor, pharmacist, or health care provider.  2020 Elsevier/Gold Standard (2007-12-22 17:22:47)  Leucovorin injection What is this medicine? LEUCOVORIN (loo koe VOR in) is used to prevent or treat the harmful effects of some medicines. This medicine is used to treat anemia caused by a low amount of folic acid in the body. It is also used with 5-fluorouracil (5-FU) to treat colon cancer. This medicine may be used for other purposes; ask your health care provider or pharmacist if you have questions. What should I tell my health care provider before I take this medicine? They need to know if you have any of these  conditions:  anemia from low levels of vitamin B-12 in the blood  an unusual or allergic reaction to leucovorin, folic acid, other medicines, foods, dyes, or preservatives  pregnant or trying to get pregnant  breast-feeding How should I use this medicine? This medicine is for injection into a muscle or into a vein. It is given by a health care professional in a hospital or clinic setting. Talk to your pediatrician regarding the use of this medicine in children. Special care may be needed. Overdosage: If you think you have taken too much of this medicine contact a poison control center or emergency room at once. NOTE: This medicine is only for you. Do not share this medicine with others. What if I miss a dose? This does not apply. What may interact with this medicine?  capecitabine  fluorouracil  phenobarbital  phenytoin  primidone  trimethoprim-sulfamethoxazole This list may not describe all possible interactions. Give your health care provider a list of all the medicines, herbs, non-prescription drugs, or dietary supplements you use. Also tell them if you smoke, drink alcohol, or use illegal drugs. Some items may interact with your medicine. What should I watch for while using this medicine? Your condition will be monitored carefully while you are receiving this medicine. This medicine may increase the side effects of 5-fluorouracil, 5-FU. Tell your doctor or health care professional if you have diarrhea or mouth sores that do not get better or that get worse. What side effects may I notice from receiving this medicine? Side effects that you should report to your doctor or health care professional as soon as possible:  allergic reactions like skin rash, itching or hives, swelling of the face, lips, or tongue  breathing problems  fever, infection  mouth sores  unusual bleeding or bruising  unusually weak or tired Side effects that usually do not require medical  attention (report to your doctor or health care professional if they continue or are bothersome):  constipation or diarrhea  loss of appetite  nausea, vomiting This list may not describe all possible side effects. Call your doctor for medical advice about side effects. You may report side effects to FDA at 1-800-FDA-1088. Where should I keep my medicine? This drug is given in a hospital or clinic and will not be stored at home. NOTE: This sheet is a summary. It may not cover all possible information. If you have questions about this medicine, talk to your doctor, pharmacist, or health care provider.  2020 Elsevier/Gold Standard (2007-12-01 16:50:29)  Irinotecan injection What is this medicine? IRINOTECAN (ir in oh TEE kan ) is a chemotherapy drug. It is used to treat colon and rectal cancer. This medicine may be used for other purposes; ask your health care provider or pharmacist if you have questions. COMMON BRAND NAME(S): Camptosar What should I tell my health care provider before I take this medicine? They need to know if  you have any of these conditions:  dehydration  diarrhea  infection (especially a virus infection such as chickenpox, cold sores, or herpes)  liver disease  low blood counts, like low white cell, platelet, or red cell counts  low levels of calcium, magnesium, or potassium in the blood  recent or ongoing radiation therapy  an unusual or allergic reaction to irinotecan, other medicines, foods, dyes, or preservatives  pregnant or trying to get pregnant  breast-feeding How should I use this medicine? This drug is given as an infusion into a vein. It is administered in a hospital or clinic by a specially trained health care professional. Talk to your pediatrician regarding the use of this medicine in children. Special care may be needed. Overdosage: If you think you have taken too much of this medicine contact a poison control center or emergency room at  once. NOTE: This medicine is only for you. Do not share this medicine with others. What if I miss a dose? It is important not to miss your dose. Call your doctor or health care professional if you are unable to keep an appointment. What may interact with this medicine? This medicine may interact with the following medications:  antiviral medicines for HIV or AIDS  certain antibiotics like rifampin or rifabutin  certain medicines for fungal infections like itraconazole, ketoconazole, posaconazole, and voriconazole  certain medicines for seizures like carbamazepine, phenobarbital, phenotoin  clarithromycin  gemfibrozil  nefazodone  St. John's Wort This list may not describe all possible interactions. Give your health care provider a list of all the medicines, herbs, non-prescription drugs, or dietary supplements you use. Also tell them if you smoke, drink alcohol, or use illegal drugs. Some items may interact with your medicine. What should I watch for while using this medicine? Your condition will be monitored carefully while you are receiving this medicine. You will need important blood work done while you are taking this medicine. This drug may make you feel generally unwell. This is not uncommon, as chemotherapy can affect healthy cells as well as cancer cells. Report any side effects. Continue your course of treatment even though you feel ill unless your doctor tells you to stop. In some cases, you may be given additional medicines to help with side effects. Follow all directions for their use. You may get drowsy or dizzy. Do not drive, use machinery, or do anything that needs mental alertness until you know how this medicine affects you. Do not stand or sit up quickly, especially if you are an older patient. This reduces the risk of dizzy or fainting spells. Call your health care professional for advice if you get a fever, chills, or sore throat, or other symptoms of a cold or flu. Do  not treat yourself. This medicine decreases your body's ability to fight infections. Try to avoid being around people who are sick. Avoid taking products that contain aspirin, acetaminophen, ibuprofen, naproxen, or ketoprofen unless instructed by your doctor. These medicines may hide a fever. This medicine may increase your risk to bruise or bleed. Call your doctor or health care professional if you notice any unusual bleeding. Be careful brushing and flossing your teeth or using a toothpick because you may get an infection or bleed more easily. If you have any dental work done, tell your dentist you are receiving this medicine. Do not become pregnant while taking this medicine or for 6 months after stopping it. Women should inform their health care professional if they wish to become  pregnant or think they might be pregnant. Men should not father a child while taking this medicine and for 3 months after stopping it. There is potential for serious side effects to an unborn child. Talk to your health care professional for more information. Do not breast-feed an infant while taking this medicine or for 7 days after stopping it. This medicine has caused ovarian failure in some women. This medicine may make it more difficult to get pregnant. Talk to your health care professional if you are concerned about your fertility. This medicine has caused decreased sperm counts in some men. This may make it more difficult to father a child. Talk to your health care professional if you are concerned about your fertility. What side effects may I notice from receiving this medicine? Side effects that you should report to your doctor or health care professional as soon as possible:  allergic reactions like skin rash, itching or hives, swelling of the face, lips, or tongue  chest pain  diarrhea  flushing, runny nose, sweating during infusion  low blood counts - this medicine may decrease the number of white blood  cells, red blood cells and platelets. You may be at increased risk for infections and bleeding.  nausea, vomiting  pain, swelling, warmth in the leg  signs of decreased platelets or bleeding - bruising, pinpoint red spots on the skin, black, tarry stools, blood in the urine  signs of infection - fever or chills, cough, sore throat, pain or difficulty passing urine  signs of decreased red blood cells - unusually weak or tired, fainting spells, lightheadedness Side effects that usually do not require medical attention (report to your doctor or health care professional if they continue or are bothersome):  constipation  hair loss  headache  loss of appetite  mouth sores  stomach pain This list may not describe all possible side effects. Call your doctor for medical advice about side effects. You may report side effects to FDA at 1-800-FDA-1088. Where should I keep my medicine? This drug is given in a hospital or clinic and will not be stored at home. NOTE: This sheet is a summary. It may not cover all possible information. If you have questions about this medicine, talk to your doctor, pharmacist, or health care provider.  2020 Elsevier/Gold Standard (2018-07-17 10:09:17)  Fluorouracil, 5FU; Diclofenac topical cream What is this medicine? FLUOROURACIL; DICLOFENAC (flure oh YOOR a sil; dye KLOE fen ak) is a combination of a topical chemotherapy agent and non-steroidal anti-inflammatory drug (NSAID). It is used on the skin to treat skin cancer and skin conditions that could become cancer. This medicine may be used for other purposes; ask your health care provider or pharmacist if you have questions. COMMON BRAND NAME(S): FLUORAC What should I tell my health care provider before I take this medicine? They need to know if you have any of these conditions:  bleeding problems  cigarette smoker  DPD enzyme deficiency  heart disease  high blood pressure  if you frequently drink  alcohol containing drinks  kidney disease  liver disease  open or infected skin  stomach problems  swelling or open sores at the treatment site  recent or planned coronary artery bypass graft (CABG) surgery  an unusual or allergic reaction to fluorouracil, diclofenac, aspirin, other NSAIDs, other medicines, foods, dyes, or preservatives  pregnant or trying to get pregnant  breast-feeding How should I use this medicine? This medicine is only for use on the skin. Follow the directions  on the prescription label. Wash hands before and after use. Wash affected area and gently pat dry. To apply this medicine use a cotton-tipped applicator, or use gloves if applying with fingertips. If applied with unprotected fingertips, it is very important to wash your hands well after you apply this medicine. Avoid applying to the eyes, nose, or mouth. Apply enough medicine to cover the affected area. You can cover the area with a light gauze dressing, but do not use tight or air-tight dressings. Finish the full course prescribed by your doctor or health care professional, even if you think your condition is better. Do not stop taking except on the advice of your doctor or health care professional. Talk to your pediatrician regarding the use of this medicine in children. Special care may be needed. Overdosage: If you think you have taken too much of this medicine contact a poison control center or emergency room at once. NOTE: This medicine is only for you. Do not share this medicine with others. What if I miss a dose? If you miss a dose, apply it as soon as you can. If it is almost time for your next dose, only use that dose. Do not apply extra doses. Contact your doctor or health care professional if you miss more than one dose. What may interact with this medicine? Interactions are not expected. Do not use any other skin products without telling your doctor or health care professional. This list may not  describe all possible interactions. Give your health care provider a list of all the medicines, herbs, non-prescription drugs, or dietary supplements you use. Also tell them if you smoke, drink alcohol, or use illegal drugs. Some items may interact with your medicine. What should I watch for while using this medicine? Visit your doctor or healthcare provider for checks on your progress. You will need to use this medicine for 2 to 6 weeks. This may be longer depending on the condition being treated. You may not see full healing for another 1 to 2 months after you stop using the medicine. This medicine may cause serious skin reactions. They can happen weeks to months after starting the medicine. Contact your healthcare provider right away if you notice fevers or flu-like symptoms with a rash. The rash may be red or purple and then turn into blisters or peeling of the skin. Or, you might notice a red rash with swelling of the face, lips or lymph nodes in your neck or under your arms. Treated areas of skin can look unsightly during and for several weeks after treatment with this medicine. This medicine can make you more sensitive to the sun. Keep out of the sun. If you cannot avoid being in the sun, wear protective clothing and use sunscreen. Do not use sun lamps or tanning beds/booths. If a pet comes in contact with the area where this medicine was applied to your skin or if it is ingested, they may have a serious risk of side effects. If accidental contact happens, the skin of the pet should be washed right away with soap and water. Contact your vet right away if your pet becomes exposed. Do not become pregnant while taking this medicine. Women should inform their doctor if they wish to become pregnant or think they might be pregnant. There is a potential for serious side effects to an unborn child. Talk to your healthcare provider or pharmacist for more information. What side effects may I notice from  receiving this medicine? Side  effects that you should report to your doctor or health care professional as soon as possible:  allergic reactions like skin rash, itching or hives, swelling of the face, lips, or tongue  black or bloody stools, blood in the urine or vomit  blurred vision  chest pain  difficulty breathing or wheezing  rash, fever, and swollen lymph nodes  redness, blistering, peeling or loosening of the skin, including inside the mouth  severe redness and swelling of normal skin  slurred speech or weakness on one side of the body  trouble passing urine or change in the amount of urine  unexplained weight gain or swelling  unusually weak or tired  yellowing of eyes or skin Side effects that usually do not require medical attention (report to your doctor or health care professional if they continue or are bothersome):  increased sensitivity of the skin to sun and ultraviolet light  pain and burning of the affected area  scaling or swelling of the affected area  skin rash, itching of the affected area  tenderness This list may not describe all possible side effects. Call your doctor for medical advice about side effects. You may report side effects to FDA at 1-800-FDA-1088. Where should I keep my medicine? Keep out of the reach of children and pets. Store at room temperature between 20 and 25 degrees C (68 and 77 degrees F). Throw away any unused medicine after the expiration date. NOTE: This sheet is a summary. It may not cover all possible information. If you have questions about this medicine, talk to your doctor, pharmacist, or health care provider.  2020 Elsevier/Gold Standard (2018-08-12 13:31:57)

## 2019-06-08 NOTE — Telephone Encounter (Signed)
Notified of low Magnesium level and to begin magnesium oxide 400 mg bid. Sent to her pharmacy. Will recheck at next visit.

## 2019-06-10 ENCOUNTER — Telehealth: Payer: Self-pay | Admitting: Oncology

## 2019-06-10 ENCOUNTER — Inpatient Hospital Stay: Payer: BC Managed Care – PPO

## 2019-06-10 ENCOUNTER — Other Ambulatory Visit: Payer: Self-pay

## 2019-06-10 VITALS — BP 132/72 | HR 88 | Temp 98.2°F | Resp 18

## 2019-06-10 DIAGNOSIS — C250 Malignant neoplasm of head of pancreas: Secondary | ICD-10-CM

## 2019-06-10 MED ORDER — HEPARIN SOD (PORK) LOCK FLUSH 100 UNIT/ML IV SOLN
500.0000 [IU] | Freq: Once | INTRAVENOUS | Status: DC | PRN
  Filled 2019-06-10: qty 5

## 2019-06-10 MED ORDER — SODIUM CHLORIDE 0.9% FLUSH
10.0000 mL | INTRAVENOUS | Status: DC | PRN
  Filled 2019-06-10: qty 10

## 2019-06-10 NOTE — Telephone Encounter (Signed)
Scheduled per los. Called and left msg. Mailed printout  °

## 2019-06-20 ENCOUNTER — Other Ambulatory Visit: Payer: Self-pay | Admitting: Oncology

## 2019-06-22 ENCOUNTER — Inpatient Hospital Stay: Payer: BC Managed Care – PPO

## 2019-06-22 ENCOUNTER — Other Ambulatory Visit: Payer: Self-pay

## 2019-06-22 ENCOUNTER — Encounter: Payer: Self-pay | Admitting: Nurse Practitioner

## 2019-06-22 ENCOUNTER — Inpatient Hospital Stay: Payer: BC Managed Care – PPO | Attending: Genetic Counselor | Admitting: Nurse Practitioner

## 2019-06-22 VITALS — BP 133/82 | HR 76 | Temp 98.0°F

## 2019-06-22 VITALS — BP 135/88 | HR 79 | Temp 98.0°F | Resp 18 | Ht 68.0 in | Wt 195.0 lb

## 2019-06-22 DIAGNOSIS — Z905 Acquired absence of kidney: Secondary | ICD-10-CM | POA: Diagnosis not present

## 2019-06-22 DIAGNOSIS — C250 Malignant neoplasm of head of pancreas: Secondary | ICD-10-CM

## 2019-06-22 DIAGNOSIS — E119 Type 2 diabetes mellitus without complications: Secondary | ICD-10-CM | POA: Diagnosis not present

## 2019-06-22 DIAGNOSIS — Z5111 Encounter for antineoplastic chemotherapy: Secondary | ICD-10-CM | POA: Insufficient documentation

## 2019-06-22 DIAGNOSIS — Z95828 Presence of other vascular implants and grafts: Secondary | ICD-10-CM

## 2019-06-22 DIAGNOSIS — C7A8 Other malignant neuroendocrine tumors: Secondary | ICD-10-CM | POA: Diagnosis not present

## 2019-06-22 DIAGNOSIS — Z85528 Personal history of other malignant neoplasm of kidney: Secondary | ICD-10-CM | POA: Insufficient documentation

## 2019-06-22 DIAGNOSIS — C187 Malignant neoplasm of sigmoid colon: Secondary | ICD-10-CM | POA: Diagnosis not present

## 2019-06-22 DIAGNOSIS — I10 Essential (primary) hypertension: Secondary | ICD-10-CM | POA: Diagnosis not present

## 2019-06-22 LAB — CMP (CANCER CENTER ONLY)
ALT: 35 U/L (ref 0–44)
AST: 40 U/L (ref 15–41)
Albumin: 3.6 g/dL (ref 3.5–5.0)
Alkaline Phosphatase: 95 U/L (ref 38–126)
Anion gap: 10 (ref 5–15)
BUN: 19 mg/dL (ref 6–20)
CO2: 26 mmol/L (ref 22–32)
Calcium: 9 mg/dL (ref 8.9–10.3)
Chloride: 106 mmol/L (ref 98–111)
Creatinine: 0.85 mg/dL (ref 0.44–1.00)
GFR, Est AFR Am: >60 mL/min (ref >60)
GFR, Estimated: >60 mL/min (ref >60)
Glucose, Bld: 141 mg/dL — ABNORMAL HIGH (ref 70–99)
Potassium: 3.5 mmol/L (ref 3.5–5.1)
Sodium: 142 mmol/L (ref 135–145)
Total Bilirubin: 0.2 mg/dL — ABNORMAL LOW (ref 0.3–1.2)
Total Protein: 6.9 g/dL (ref 6.5–8.1)

## 2019-06-22 LAB — CBC WITH DIFFERENTIAL (CANCER CENTER ONLY)
Abs Immature Granulocytes: 0 10*3/uL (ref 0.00–0.07)
Basophils Absolute: 0 10*3/uL (ref 0.0–0.1)
Basophils Relative: 1 %
Eosinophils Absolute: 0 10*3/uL (ref 0.0–0.5)
Eosinophils Relative: 1 %
HCT: 30.5 % — ABNORMAL LOW (ref 36.0–46.0)
Hemoglobin: 9.8 g/dL — ABNORMAL LOW (ref 12.0–15.0)
Immature Granulocytes: 0 %
Lymphocytes Relative: 48 %
Lymphs Abs: 2.2 10*3/uL (ref 0.7–4.0)
MCH: 28.3 pg (ref 26.0–34.0)
MCHC: 32.1 g/dL (ref 30.0–36.0)
MCV: 88.2 fL (ref 80.0–100.0)
Monocytes Absolute: 0.5 10*3/uL (ref 0.1–1.0)
Monocytes Relative: 10 %
Neutro Abs: 1.8 10*3/uL (ref 1.7–7.7)
Neutrophils Relative %: 40 %
Platelet Count: 302 10*3/uL (ref 150–400)
RBC: 3.46 MIL/uL — ABNORMAL LOW (ref 3.87–5.11)
RDW: 14.9 % (ref 11.5–15.5)
WBC Count: 4.5 10*3/uL (ref 4.0–10.5)
nRBC: 0 % (ref 0.0–0.2)

## 2019-06-22 LAB — MAGNESIUM: Magnesium: 1.6 mg/dL — ABNORMAL LOW (ref 1.7–2.4)

## 2019-06-22 MED ORDER — ATROPINE SULFATE 0.4 MG/ML IJ SOLN
INTRAMUSCULAR | Status: AC
  Filled 2019-06-22: qty 1

## 2019-06-22 MED ORDER — PALONOSETRON HCL INJECTION 0.25 MG/5ML
INTRAVENOUS | Status: AC
  Filled 2019-06-22: qty 5

## 2019-06-22 MED ORDER — SODIUM CHLORIDE 0.9 % IV SOLN
Freq: Once | INTRAVENOUS | Status: AC
  Filled 2019-06-22: qty 5

## 2019-06-22 MED ORDER — SODIUM CHLORIDE 0.9 % IV SOLN
2000.0000 mg/m2 | INTRAVENOUS | Status: DC
  Administered 2019-06-22: 4100 mg via INTRAVENOUS
  Filled 2019-06-22: qty 82

## 2019-06-22 MED ORDER — DEXTROSE 5 % IV SOLN
Freq: Once | INTRAVENOUS | Status: AC
  Filled 2019-06-22: qty 250

## 2019-06-22 MED ORDER — ATROPINE SULFATE 1 MG/ML IJ SOLN
INTRAMUSCULAR | Status: AC
  Filled 2019-06-22: qty 1

## 2019-06-22 MED ORDER — PALONOSETRON HCL INJECTION 0.25 MG/5ML
0.2500 mg | Freq: Once | INTRAVENOUS | Status: AC
  Administered 2019-06-22: 0.25 mg via INTRAVENOUS

## 2019-06-22 MED ORDER — IRINOTECAN HCL CHEMO INJECTION 100 MG/5ML
150.0000 mg/m2 | Freq: Once | INTRAVENOUS | Status: AC
  Administered 2019-06-22: 300 mg via INTRAVENOUS
  Filled 2019-06-22: qty 15

## 2019-06-22 MED ORDER — LEUCOVORIN CALCIUM INJECTION 350 MG
400.0000 mg/m2 | Freq: Once | INTRAVENOUS | Status: AC
  Administered 2019-06-22: 824 mg via INTRAVENOUS
  Filled 2019-06-22: qty 41.2

## 2019-06-22 MED ORDER — OXALIPLATIN CHEMO INJECTION 100 MG/20ML
85.0000 mg/m2 | Freq: Once | INTRAVENOUS | Status: AC
  Administered 2019-06-22: 175 mg via INTRAVENOUS
  Filled 2019-06-22: qty 35

## 2019-06-22 MED ORDER — ATROPINE SULFATE 1 MG/ML IJ SOLN
0.5000 mg | Freq: Once | INTRAMUSCULAR | Status: AC | PRN
  Administered 2019-06-22: 0.5 mg via INTRAVENOUS

## 2019-06-22 MED ORDER — SODIUM CHLORIDE 0.9% FLUSH
10.0000 mL | Freq: Once | INTRAVENOUS | Status: AC
  Administered 2019-06-22: 10 mL
  Filled 2019-06-22: qty 10

## 2019-06-22 NOTE — Patient Instructions (Signed)
[facility] Discharge Instructions for Patients Receiving Chemotherapy  Today you received the following chemotherapy agents: Oxaliplatin, leucovorin, Irinotecan, 5FU  To help prevent nausea and vomiting after your treatment, we encourage you to take your nausea medication as directed.   If you develop nausea and vomiting that is not controlled by your nausea medication, call the clinic.   BELOW ARE SYMPTOMS THAT SHOULD BE REPORTED IMMEDIATELY:  *FEVER GREATER THAN 100.5 F  *CHILLS WITH OR WITHOUT FEVER  NAUSEA AND VOMITING THAT IS NOT CONTROLLED WITH YOUR NAUSEA MEDICATION  *UNUSUAL SHORTNESS OF BREATH  *UNUSUAL BRUISING OR BLEEDING  TENDERNESS IN MOUTH AND THROAT WITH OR WITHOUT PRESENCE OF ULCERS  *URINARY PROBLEMS  *BOWEL PROBLEMS  UNUSUAL RASH Items with * indicate a potential emergency and should be followed up as soon as possible.  Feel free to call the clinic should you have any questions or concerns. The clinic phone number is [phone].  Please show the [facility] at check-in to the Emergency Department and triage nurse.  Oxaliplatin Injection What is this medicine? OXALIPLATIN (ox AL i PLA tin) is a chemotherapy drug. It targets fast dividing cells, like cancer cells, and causes these cells to die. This medicine is used to treat cancers of the colon and rectum, and many other cancers. This medicine may be used for other purposes; ask your health care provider or pharmacist if you have questions. COMMON BRAND NAME(S): Eloxatin What should I tell my health care provider before I take this medicine? They need to know if you have any of these conditions:  kidney disease  an unusual or allergic reaction to oxaliplatin, other chemotherapy, other medicines, foods, dyes, or preservatives  pregnant or trying to get pregnant  breast-feeding How should I use this medicine? This drug is given as an infusion into a vein. It is administered in a  hospital or clinic by a specially trained health care professional. Talk to your pediatrician regarding the use of this medicine in children. Special care may be needed. Overdosage: If you think you have taken too much of this medicine contact a poison control center or emergency room at once. NOTE: This medicine is only for you. Do not share this medicine with others. What if I miss a dose? It is important not to miss a dose. Call your doctor or health care professional if you are unable to keep an appointment. What may interact with this medicine?  medicines to increase blood counts like filgrastim, pegfilgrastim, sargramostim  probenecid  some antibiotics like amikacin, gentamicin, neomycin, polymyxin B, streptomycin, tobramycin  zalcitabine Talk to your doctor or health care professional before taking any of these medicines:  acetaminophen  aspirin  ibuprofen  ketoprofen  naproxen This list may not describe all possible interactions. Give your health care provider a list of all the medicines, herbs, non-prescription drugs, or dietary supplements you use. Also tell them if you smoke, drink alcohol, or use illegal drugs. Some items may interact with your medicine. What should I watch for while using this medicine? Your condition will be monitored carefully while you are receiving this medicine. You will need important blood work done while you are taking this medicine. This medicine can make you more sensitive to cold. Do not drink cold drinks or use ice. Cover exposed skin before coming in contact with cold temperatures or cold objects. When out in cold weather wear warm clothing and cover your mouth and nose to warm the air that goes  into your lungs. Tell your doctor if you get sensitive to the cold. This drug may make you feel generally unwell. This is not uncommon, as chemotherapy can affect healthy cells as well as cancer cells. Report any side effects. Continue your course of  treatment even though you feel ill unless your doctor tells you to stop. In some cases, you may be given additional medicines to help with side effects. Follow all directions for their use. Call your doctor or health care professional for advice if you get a fever, chills or sore throat, or other symptoms of a cold or flu. Do not treat yourself. This drug decreases your body's ability to fight infections. Try to avoid being around people who are sick. This medicine may increase your risk to bruise or bleed. Call your doctor or health care professional if you notice any unusual bleeding. Be careful brushing and flossing your teeth or using a toothpick because you may get an infection or bleed more easily. If you have any dental work done, tell your dentist you are receiving this medicine. Avoid taking products that contain aspirin, acetaminophen, ibuprofen, naproxen, or ketoprofen unless instructed by your doctor. These medicines may hide a fever. Do not become pregnant while taking this medicine. Women should inform their doctor if they wish to become pregnant or think they might be pregnant. There is a potential for serious side effects to an unborn child. Talk to your health care professional or pharmacist for more information. Do not breast-feed an infant while taking this medicine. Call your doctor or health care professional if you get diarrhea. Do not treat yourself. What side effects may I notice from receiving this medicine? Side effects that you should report to your doctor or health care professional as soon as possible:  allergic reactions like skin rash, itching or hives, swelling of the face, lips, or tongue  low blood counts - This drug may decrease the number of white blood cells, red blood cells and platelets. You may be at increased risk for infections and bleeding.  signs of infection - fever or chills, cough, sore throat, pain or difficulty passing urine  signs of decreased  platelets or bleeding - bruising, pinpoint red spots on the skin, black, tarry stools, nosebleeds  signs of decreased red blood cells - unusually weak or tired, fainting spells, lightheadedness  breathing problems  chest pain, pressure  cough  diarrhea  jaw tightness  mouth sores  nausea and vomiting  pain, swelling, redness or irritation at the injection site  pain, tingling, numbness in the hands or feet  problems with balance, talking, walking  redness, blistering, peeling or loosening of the skin, including inside the mouth  trouble passing urine or change in the amount of urine Side effects that usually do not require medical attention (report to your doctor or health care professional if they continue or are bothersome):  changes in vision  constipation  hair loss  loss of appetite  metallic taste in the mouth or changes in taste  stomach pain This list may not describe all possible side effects. Call your doctor for medical advice about side effects. You may report side effects to FDA at 1-800-FDA-1088. Where should I keep my medicine? This drug is given in a hospital or clinic and will not be stored at home. NOTE: This sheet is a summary. It may not cover all possible information. If you have questions about this medicine, talk to your doctor, pharmacist, or health care provider.  2020 Elsevier/Gold Standard (2007-12-22 17:22:47)  Leucovorin injection What is this medicine? LEUCOVORIN (loo koe VOR in) is used to prevent or treat the harmful effects of some medicines. This medicine is used to treat anemia caused by a low amount of folic acid in the body. It is also used with 5-fluorouracil (5-FU) to treat colon cancer. This medicine may be used for other purposes; ask your health care provider or pharmacist if you have questions. What should I tell my health care provider before I take this medicine? They need to know if you have any of these  conditions:  anemia from low levels of vitamin B-12 in the blood  an unusual or allergic reaction to leucovorin, folic acid, other medicines, foods, dyes, or preservatives  pregnant or trying to get pregnant  breast-feeding How should I use this medicine? This medicine is for injection into a muscle or into a vein. It is given by a health care professional in a hospital or clinic setting. Talk to your pediatrician regarding the use of this medicine in children. Special care may be needed. Overdosage: If you think you have taken too much of this medicine contact a poison control center or emergency room at once. NOTE: This medicine is only for you. Do not share this medicine with others. What if I miss a dose? This does not apply. What may interact with this medicine?  capecitabine  fluorouracil  phenobarbital  phenytoin  primidone  trimethoprim-sulfamethoxazole This list may not describe all possible interactions. Give your health care provider a list of all the medicines, herbs, non-prescription drugs, or dietary supplements you use. Also tell them if you smoke, drink alcohol, or use illegal drugs. Some items may interact with your medicine. What should I watch for while using this medicine? Your condition will be monitored carefully while you are receiving this medicine. This medicine may increase the side effects of 5-fluorouracil, 5-FU. Tell your doctor or health care professional if you have diarrhea or mouth sores that do not get better or that get worse. What side effects may I notice from receiving this medicine? Side effects that you should report to your doctor or health care professional as soon as possible:  allergic reactions like skin rash, itching or hives, swelling of the face, lips, or tongue  breathing problems  fever, infection  mouth sores  unusual bleeding or bruising  unusually weak or tired Side effects that usually do not require medical  attention (report to your doctor or health care professional if they continue or are bothersome):  constipation or diarrhea  loss of appetite  nausea, vomiting This list may not describe all possible side effects. Call your doctor for medical advice about side effects. You may report side effects to FDA at 1-800-FDA-1088. Where should I keep my medicine? This drug is given in a hospital or clinic and will not be stored at home. NOTE: This sheet is a summary. It may not cover all possible information. If you have questions about this medicine, talk to your doctor, pharmacist, or health care provider.  2020 Elsevier/Gold Standard (2007-12-01 16:50:29)  Irinotecan injection What is this medicine? IRINOTECAN (ir in oh TEE kan ) is a chemotherapy drug. It is used to treat colon and rectal cancer. This medicine may be used for other purposes; ask your health care provider or pharmacist if you have questions. COMMON BRAND NAME(S): Camptosar What should I tell my health care provider before I take this medicine? They need to know if  you have any of these conditions:  dehydration  diarrhea  infection (especially a virus infection such as chickenpox, cold sores, or herpes)  liver disease  low blood counts, like low white cell, platelet, or red cell counts  low levels of calcium, magnesium, or potassium in the blood  recent or ongoing radiation therapy  an unusual or allergic reaction to irinotecan, other medicines, foods, dyes, or preservatives  pregnant or trying to get pregnant  breast-feeding How should I use this medicine? This drug is given as an infusion into a vein. It is administered in a hospital or clinic by a specially trained health care professional. Talk to your pediatrician regarding the use of this medicine in children. Special care may be needed. Overdosage: If you think you have taken too much of this medicine contact a poison control center or emergency room at  once. NOTE: This medicine is only for you. Do not share this medicine with others. What if I miss a dose? It is important not to miss your dose. Call your doctor or health care professional if you are unable to keep an appointment. What may interact with this medicine? This medicine may interact with the following medications:  antiviral medicines for HIV or AIDS  certain antibiotics like rifampin or rifabutin  certain medicines for fungal infections like itraconazole, ketoconazole, posaconazole, and voriconazole  certain medicines for seizures like carbamazepine, phenobarbital, phenotoin  clarithromycin  gemfibrozil  nefazodone  St. John's Wort This list may not describe all possible interactions. Give your health care provider a list of all the medicines, herbs, non-prescription drugs, or dietary supplements you use. Also tell them if you smoke, drink alcohol, or use illegal drugs. Some items may interact with your medicine. What should I watch for while using this medicine? Your condition will be monitored carefully while you are receiving this medicine. You will need important blood work done while you are taking this medicine. This drug may make you feel generally unwell. This is not uncommon, as chemotherapy can affect healthy cells as well as cancer cells. Report any side effects. Continue your course of treatment even though you feel ill unless your doctor tells you to stop. In some cases, you may be given additional medicines to help with side effects. Follow all directions for their use. You may get drowsy or dizzy. Do not drive, use machinery, or do anything that needs mental alertness until you know how this medicine affects you. Do not stand or sit up quickly, especially if you are an older patient. This reduces the risk of dizzy or fainting spells. Call your health care professional for advice if you get a fever, chills, or sore throat, or other symptoms of a cold or flu. Do  not treat yourself. This medicine decreases your body's ability to fight infections. Try to avoid being around people who are sick. Avoid taking products that contain aspirin, acetaminophen, ibuprofen, naproxen, or ketoprofen unless instructed by your doctor. These medicines may hide a fever. This medicine may increase your risk to bruise or bleed. Call your doctor or health care professional if you notice any unusual bleeding. Be careful brushing and flossing your teeth or using a toothpick because you may get an infection or bleed more easily. If you have any dental work done, tell your dentist you are receiving this medicine. Do not become pregnant while taking this medicine or for 6 months after stopping it. Women should inform their health care professional if they wish to become  pregnant or think they might be pregnant. Men should not father a child while taking this medicine and for 3 months after stopping it. There is potential for serious side effects to an unborn child. Talk to your health care professional for more information. Do not breast-feed an infant while taking this medicine or for 7 days after stopping it. This medicine has caused ovarian failure in some women. This medicine may make it more difficult to get pregnant. Talk to your health care professional if you are concerned about your fertility. This medicine has caused decreased sperm counts in some men. This may make it more difficult to father a child. Talk to your health care professional if you are concerned about your fertility. What side effects may I notice from receiving this medicine? Side effects that you should report to your doctor or health care professional as soon as possible:  allergic reactions like skin rash, itching or hives, swelling of the face, lips, or tongue  chest pain  diarrhea  flushing, runny nose, sweating during infusion  low blood counts - this medicine may decrease the number of white blood  cells, red blood cells and platelets. You may be at increased risk for infections and bleeding.  nausea, vomiting  pain, swelling, warmth in the leg  signs of decreased platelets or bleeding - bruising, pinpoint red spots on the skin, black, tarry stools, blood in the urine  signs of infection - fever or chills, cough, sore throat, pain or difficulty passing urine  signs of decreased red blood cells - unusually weak or tired, fainting spells, lightheadedness Side effects that usually do not require medical attention (report to your doctor or health care professional if they continue or are bothersome):  constipation  hair loss  headache  loss of appetite  mouth sores  stomach pain This list may not describe all possible side effects. Call your doctor for medical advice about side effects. You may report side effects to FDA at 1-800-FDA-1088. Where should I keep my medicine? This drug is given in a hospital or clinic and will not be stored at home. NOTE: This sheet is a summary. It may not cover all possible information. If you have questions about this medicine, talk to your doctor, pharmacist, or health care provider.  2020 Elsevier/Gold Standard (2018-07-17 10:09:17)  Fluorouracil, 5FU; Diclofenac topical cream What is this medicine? FLUOROURACIL; DICLOFENAC (flure oh YOOR a sil; dye KLOE fen ak) is a combination of a topical chemotherapy agent and non-steroidal anti-inflammatory drug (NSAID). It is used on the skin to treat skin cancer and skin conditions that could become cancer. This medicine may be used for other purposes; ask your health care provider or pharmacist if you have questions. COMMON BRAND NAME(S): FLUORAC What should I tell my health care provider before I take this medicine? They need to know if you have any of these conditions:  bleeding problems  cigarette smoker  DPD enzyme deficiency  heart disease  high blood pressure  if you frequently drink  alcohol containing drinks  kidney disease  liver disease  open or infected skin  stomach problems  swelling or open sores at the treatment site  recent or planned coronary artery bypass graft (CABG) surgery  an unusual or allergic reaction to fluorouracil, diclofenac, aspirin, other NSAIDs, other medicines, foods, dyes, or preservatives  pregnant or trying to get pregnant  breast-feeding How should I use this medicine? This medicine is only for use on the skin. Follow the directions  on the prescription label. Wash hands before and after use. Wash affected area and gently pat dry. To apply this medicine use a cotton-tipped applicator, or use gloves if applying with fingertips. If applied with unprotected fingertips, it is very important to wash your hands well after you apply this medicine. Avoid applying to the eyes, nose, or mouth. Apply enough medicine to cover the affected area. You can cover the area with a light gauze dressing, but do not use tight or air-tight dressings. Finish the full course prescribed by your doctor or health care professional, even if you think your condition is better. Do not stop taking except on the advice of your doctor or health care professional. Talk to your pediatrician regarding the use of this medicine in children. Special care may be needed. Overdosage: If you think you have taken too much of this medicine contact a poison control center or emergency room at once. NOTE: This medicine is only for you. Do not share this medicine with others. What if I miss a dose? If you miss a dose, apply it as soon as you can. If it is almost time for your next dose, only use that dose. Do not apply extra doses. Contact your doctor or health care professional if you miss more than one dose. What may interact with this medicine? Interactions are not expected. Do not use any other skin products without telling your doctor or health care professional. This list may not  describe all possible interactions. Give your health care provider a list of all the medicines, herbs, non-prescription drugs, or dietary supplements you use. Also tell them if you smoke, drink alcohol, or use illegal drugs. Some items may interact with your medicine. What should I watch for while using this medicine? Visit your doctor or healthcare provider for checks on your progress. You will need to use this medicine for 2 to 6 weeks. This may be longer depending on the condition being treated. You may not see full healing for another 1 to 2 months after you stop using the medicine. This medicine may cause serious skin reactions. They can happen weeks to months after starting the medicine. Contact your healthcare provider right away if you notice fevers or flu-like symptoms with a rash. The rash may be red or purple and then turn into blisters or peeling of the skin. Or, you might notice a red rash with swelling of the face, lips or lymph nodes in your neck or under your arms. Treated areas of skin can look unsightly during and for several weeks after treatment with this medicine. This medicine can make you more sensitive to the sun. Keep out of the sun. If you cannot avoid being in the sun, wear protective clothing and use sunscreen. Do not use sun lamps or tanning beds/booths. If a pet comes in contact with the area where this medicine was applied to your skin or if it is ingested, they may have a serious risk of side effects. If accidental contact happens, the skin of the pet should be washed right away with soap and water. Contact your vet right away if your pet becomes exposed. Do not become pregnant while taking this medicine. Women should inform their doctor if they wish to become pregnant or think they might be pregnant. There is a potential for serious side effects to an unborn child. Talk to your healthcare provider or pharmacist for more information. What side effects may I notice from  receiving this medicine? Side  effects that you should report to your doctor or health care professional as soon as possible:  allergic reactions like skin rash, itching or hives, swelling of the face, lips, or tongue  black or bloody stools, blood in the urine or vomit  blurred vision  chest pain  difficulty breathing or wheezing  rash, fever, and swollen lymph nodes  redness, blistering, peeling or loosening of the skin, including inside the mouth  severe redness and swelling of normal skin  slurred speech or weakness on one side of the body  trouble passing urine or change in the amount of urine  unexplained weight gain or swelling  unusually weak or tired  yellowing of eyes or skin Side effects that usually do not require medical attention (report to your doctor or health care professional if they continue or are bothersome):  increased sensitivity of the skin to sun and ultraviolet light  pain and burning of the affected area  scaling or swelling of the affected area  skin rash, itching of the affected area  tenderness This list may not describe all possible side effects. Call your doctor for medical advice about side effects. You may report side effects to FDA at 1-800-FDA-1088. Where should I keep my medicine? Keep out of the reach of children and pets. Store at room temperature between 20 and 25 degrees C (68 and 77 degrees F). Throw away any unused medicine after the expiration date. NOTE: This sheet is a summary. It may not cover all possible information. If you have questions about this medicine, talk to your doctor, pharmacist, or health care provider.  2020 Elsevier/Gold Standard (2018-08-12 13:31:57)

## 2019-06-22 NOTE — Progress Notes (Addendum)
Nutrition Follow-up:  Patient with pancreatic cancer, s/p whipple on 03/04/2019 and partial nephrectomy.  Patient receiving folfirinox.    Met with patient during infusion.  Patient reports appetite is decreased during the week of treatment and then increases.  Reports that she ate a pork chop sandwich yesterday, lasagna soup and turkey with kale for dinner last night.     Patient wanting to increase exercise.   Medications: reviewed  Labs: reviewed  Anthropometrics:   Weight 195 lb today increased from 193 lb 7 oz on 12/15   NUTRITION DIAGNOSIS: Unintentional weight loss resolved   INTERVENTION:  Encouraged good sources of protein at every meal and being mindful of carbohydrates for blood glucose control.    MONITORING, EVALUATION, GOAL: patient will consume adequate calories and protein to maintain weight during treatment  Next visit:  To be determined with treatment  Joli B. Allen, RD, LDN Registered Dietitian [phone] (pager)     

## 2019-06-22 NOTE — Progress Notes (Signed)
  [facility] OFF[facility] PROGRESS NOTE   Diagnosis: Pancreas cancer  INTERVAL HISTORY:   [facility] Ross returns as scheduled.  She completed cycle 4 FOLFIRINOX 06/08/2019.  She denies nausea/vomiting.  No mouth sores.  She tends to develop diarrhea on day 2 or 3.  She notes good relief with Imodium.  She has mild persistent cold sensitivity.  No numbness or tingling unrelated to cold exposure.  Objective:  Vital signs in last 24 hours:  Blood pressure 135/88, pulse 79, temperature 98 F (36.7 C), temperature source Temporal, resp. rate 18, height 5\' 8"  (1.727 m), weight 195 lb (88.5 kg), last menstrual period 09/17/2012, SpO2 100 %.    HEENT: No thrush or ulcers. GI: Abdomen soft and nontender.  No hepatomegaly.  No mass. Vascular: No leg edema. Neuro: Vibratory sense intact over the fingertips per tuning fork exam. Skin: Palms with hyperpigmentation, no erythema. Port-A-Cath without erythema.   Lab Results:  Lab Results  Component Value Date   WBC 4.5 06/22/2019   HGB 9.8 (L) 06/22/2019   HCT 30.5 (L) 06/22/2019   MCV 88.2 06/22/2019   PLT 302 06/22/2019   NEUTROABS 1.8 06/22/2019    Imaging:  No results found.  Medications: I have reviewed the patient's current medications.  Assessment/Plan: 1. 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 Tumor invades the duodenal wall and vascular groove, resection margins negative, 4/34 lymph nodes positive ? 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 CTs 01/29/2019-ill-defined pancreas head mass, 5 pulmonary nodules-1 with a small amount of central cavitation, tumor abuts the left margin of the portal vein indistinct density surrounding, hepatic artery, complex cystic lesion of the right kidney, right adrenal mass-characterized as an adenoma on a [facility] MRI 12/21/2018 ? 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 Elevated preoperative CA 19-9 ? CT chest 04/16/2019-multiple bilateral pulmonary nodules, some with increased cavitation, stable in size ? Cycle 1 FOLFIRINOX 04/27/2019 ? Cycle 2 FOLFIRINOX 05/11/2019 ? Cycle 3 FOLFIRINOX 05/23/2019 ? Cycle 4 FOLFIRINOX 06/08/2019 ? Cycle 5 FOLFIRINOX 06/22/2019  2. 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 Diabetes 4. Hypertension 5. Family history of pancreas cancer 6. Port-A-Cath placement, Dr. Barry Dienes, 04/21/2019 7. Oxaliplatin neuropathy  Disposition: Alyssa Ross appears stable.  She has completed 4 cycles of FOLFIRINOX.  Plan to proceed with cycle 5 today as scheduled.  She will undergo a restaging noncontrast CT scan of the chest prior to her next visit in 2 weeks.  We reviewed the CBC from today.  Counts adequate to proceed with treatment.  She will return for lab, follow-up, FOLFIRINOX in 2 weeks.  She will contact the office in the interim with any problems.    Ned Card ANP/GNP-BC   06/22/2019  11:23 AM

## 2019-06-24 ENCOUNTER — Inpatient Hospital Stay: Payer: BC Managed Care – PPO

## 2019-06-24 ENCOUNTER — Other Ambulatory Visit: Payer: Self-pay

## 2019-06-24 ENCOUNTER — Telehealth: Payer: Self-pay | Admitting: Oncology

## 2019-06-24 VITALS — BP 105/80 | HR 115 | Temp 97.8°F | Resp 18

## 2019-06-24 DIAGNOSIS — E119 Type 2 diabetes mellitus without complications: Secondary | ICD-10-CM | POA: Diagnosis not present

## 2019-06-24 DIAGNOSIS — C250 Malignant neoplasm of head of pancreas: Secondary | ICD-10-CM

## 2019-06-24 DIAGNOSIS — Z5111 Encounter for antineoplastic chemotherapy: Secondary | ICD-10-CM | POA: Diagnosis not present

## 2019-06-24 DIAGNOSIS — Z905 Acquired absence of kidney: Secondary | ICD-10-CM | POA: Diagnosis not present

## 2019-06-24 DIAGNOSIS — Z85528 Personal history of other malignant neoplasm of kidney: Secondary | ICD-10-CM | POA: Diagnosis not present

## 2019-06-24 DIAGNOSIS — I10 Essential (primary) hypertension: Secondary | ICD-10-CM | POA: Diagnosis not present

## 2019-06-24 DIAGNOSIS — C7A8 Other malignant neuroendocrine tumors: Secondary | ICD-10-CM | POA: Diagnosis not present

## 2019-06-24 MED ORDER — HEPARIN SOD (PORK) LOCK FLUSH 100 UNIT/ML IV SOLN
500.0000 [IU] | Freq: Once | INTRAVENOUS | Status: AC | PRN
  Administered 2019-06-24: 500 [IU]
  Filled 2019-06-24: qty 5

## 2019-06-24 MED ORDER — SODIUM CHLORIDE 0.9% FLUSH
10.0000 mL | INTRAVENOUS | Status: DC | PRN
  Administered 2019-06-24: 10 mL
  Filled 2019-06-24: qty 10

## 2019-06-24 NOTE — Telephone Encounter (Signed)
Scheduled per los. Called and left msg. Mailed printout  °

## 2019-06-27 DIAGNOSIS — C187 Malignant neoplasm of sigmoid colon: Secondary | ICD-10-CM | POA: Diagnosis not present

## 2019-07-01 DIAGNOSIS — R928 Other abnormal and inconclusive findings on diagnostic imaging of breast: Secondary | ICD-10-CM | POA: Diagnosis not present

## 2019-07-01 DIAGNOSIS — N6489 Other specified disorders of breast: Secondary | ICD-10-CM | POA: Diagnosis not present

## 2019-07-01 LAB — HM MAMMOGRAPHY

## 2019-07-02 ENCOUNTER — Other Ambulatory Visit: Payer: Self-pay

## 2019-07-02 ENCOUNTER — Ambulatory Visit (HOSPITAL_COMMUNITY)
Admission: RE | Admit: 2019-07-02 | Discharge: 2019-07-02 | Disposition: A | Discharge status: Home / Self Care | Payer: BC Managed Care – PPO | Source: Ambulatory Visit | Attending: Nurse Practitioner | Admitting: Nurse Practitioner

## 2019-07-02 DIAGNOSIS — C259 Malignant neoplasm of pancreas, unspecified: Secondary | ICD-10-CM | POA: Diagnosis not present

## 2019-07-02 DIAGNOSIS — R918 Other nonspecific abnormal finding of lung field: Secondary | ICD-10-CM | POA: Diagnosis not present

## 2019-07-02 DIAGNOSIS — C250 Malignant neoplasm of head of pancreas: Secondary | ICD-10-CM

## 2019-07-03 ENCOUNTER — Other Ambulatory Visit: Payer: Self-pay | Admitting: Oncology

## 2019-07-06 ENCOUNTER — Inpatient Hospital Stay: Payer: BC Managed Care – PPO

## 2019-07-06 ENCOUNTER — Telehealth: Payer: Self-pay

## 2019-07-06 ENCOUNTER — Inpatient Hospital Stay: Payer: BC Managed Care – PPO | Admitting: Nurse Practitioner

## 2019-07-06 ENCOUNTER — Encounter: Payer: Self-pay | Admitting: Internal Medicine

## 2019-07-06 ENCOUNTER — Other Ambulatory Visit: Payer: Self-pay

## 2019-07-06 ENCOUNTER — Encounter: Payer: Self-pay | Admitting: Nurse Practitioner

## 2019-07-06 VITALS — BP 147/99 | HR 90 | Temp 98.0°F | Resp 16 | Ht 68.0 in | Wt 194.9 lb

## 2019-07-06 DIAGNOSIS — C250 Malignant neoplasm of head of pancreas: Secondary | ICD-10-CM

## 2019-07-06 DIAGNOSIS — C7A8 Other malignant neuroendocrine tumors: Secondary | ICD-10-CM | POA: Diagnosis not present

## 2019-07-06 DIAGNOSIS — Z95828 Presence of other vascular implants and grafts: Secondary | ICD-10-CM

## 2019-07-06 DIAGNOSIS — E119 Type 2 diabetes mellitus without complications: Secondary | ICD-10-CM | POA: Diagnosis not present

## 2019-07-06 DIAGNOSIS — C187 Malignant neoplasm of sigmoid colon: Secondary | ICD-10-CM | POA: Diagnosis not present

## 2019-07-06 DIAGNOSIS — Z905 Acquired absence of kidney: Secondary | ICD-10-CM | POA: Diagnosis not present

## 2019-07-06 DIAGNOSIS — I10 Essential (primary) hypertension: Secondary | ICD-10-CM | POA: Diagnosis not present

## 2019-07-06 DIAGNOSIS — Z5111 Encounter for antineoplastic chemotherapy: Secondary | ICD-10-CM | POA: Diagnosis not present

## 2019-07-06 DIAGNOSIS — Z85528 Personal history of other malignant neoplasm of kidney: Secondary | ICD-10-CM | POA: Diagnosis not present

## 2019-07-06 LAB — CBC WITH DIFFERENTIAL (CANCER CENTER ONLY)
Abs Immature Granulocytes: 0.01 10*3/uL (ref 0.00–0.07)
Basophils Absolute: 0 10*3/uL (ref 0.0–0.1)
Basophils Relative: 1 %
Eosinophils Absolute: 0.1 10*3/uL (ref 0.0–0.5)
Eosinophils Relative: 1 %
HCT: 30.8 % — ABNORMAL LOW (ref 36.0–46.0)
Hemoglobin: 9.9 g/dL — ABNORMAL LOW (ref 12.0–15.0)
Immature Granulocytes: 0 %
Lymphocytes Relative: 48 %
Lymphs Abs: 2.3 10*3/uL (ref 0.7–4.0)
MCH: 29.1 pg (ref 26.0–34.0)
MCHC: 32.1 g/dL (ref 30.0–36.0)
MCV: 90.6 fL (ref 80.0–100.0)
Monocytes Absolute: 0.6 10*3/uL (ref 0.1–1.0)
Monocytes Relative: 12 %
Neutro Abs: 1.8 10*3/uL (ref 1.7–7.7)
Neutrophils Relative %: 38 %
Platelet Count: 329 10*3/uL (ref 150–400)
RBC: 3.4 MIL/uL — ABNORMAL LOW (ref 3.87–5.11)
RDW: 15.2 % (ref 11.5–15.5)
WBC Count: 4.8 10*3/uL (ref 4.0–10.5)
nRBC: 0 % (ref 0.0–0.2)

## 2019-07-06 LAB — CMP (CANCER CENTER ONLY)
ALT: 49 U/L — ABNORMAL HIGH (ref 0–44)
AST: 50 U/L — ABNORMAL HIGH (ref 15–41)
Albumin: 3.6 g/dL (ref 3.5–5.0)
Alkaline Phosphatase: 127 U/L — ABNORMAL HIGH (ref 38–126)
Anion gap: 9 (ref 5–15)
BUN: 13 mg/dL (ref 6–20)
CO2: 27 mmol/L (ref 22–32)
Calcium: 9.2 mg/dL (ref 8.9–10.3)
Chloride: 107 mmol/L (ref 98–111)
Creatinine: 0.85 mg/dL (ref 0.44–1.00)
GFR, Est AFR Am: >60 mL/min (ref >60)
GFR, Estimated: >60 mL/min (ref >60)
Glucose, Bld: 142 mg/dL — ABNORMAL HIGH (ref 70–99)
Potassium: 3.7 mmol/L (ref 3.5–5.1)
Sodium: 143 mmol/L (ref 135–145)
Total Bilirubin: 0.3 mg/dL (ref 0.3–1.2)
Total Protein: 7 g/dL (ref 6.5–8.1)

## 2019-07-06 LAB — MAGNESIUM: Magnesium: 1.7 mg/dL (ref 1.7–2.4)

## 2019-07-06 MED ORDER — ATROPINE SULFATE 1 MG/ML IJ SOLN
0.5000 mg | Freq: Once | INTRAMUSCULAR | Status: AC | PRN
  Administered 2019-07-06: 0.5 mg via INTRAVENOUS

## 2019-07-06 MED ORDER — DEXTROSE 5 % IV SOLN
Freq: Once | INTRAVENOUS | Status: AC
  Filled 2019-07-06: qty 250

## 2019-07-06 MED ORDER — SODIUM CHLORIDE 0.9 % IV SOLN
Freq: Once | INTRAVENOUS | Status: AC
  Filled 2019-07-06: qty 5

## 2019-07-06 MED ORDER — ATROPINE SULFATE 0.4 MG/ML IJ SOLN
INTRAMUSCULAR | Status: AC
  Filled 2019-07-06: qty 1

## 2019-07-06 MED ORDER — PALONOSETRON HCL INJECTION 0.25 MG/5ML
INTRAVENOUS | Status: AC
  Filled 2019-07-06: qty 5

## 2019-07-06 MED ORDER — OXALIPLATIN CHEMO INJECTION 100 MG/20ML
85.0000 mg/m2 | Freq: Once | INTRAVENOUS | Status: AC
  Administered 2019-07-06: 175 mg via INTRAVENOUS
  Filled 2019-07-06: qty 35

## 2019-07-06 MED ORDER — LEUCOVORIN CALCIUM INJECTION 350 MG
400.0000 mg/m2 | Freq: Once | INTRAVENOUS | Status: AC
  Administered 2019-07-06: 824 mg via INTRAVENOUS
  Filled 2019-07-06: qty 41.2

## 2019-07-06 MED ORDER — SODIUM CHLORIDE 0.9 % IV SOLN
2000.0000 mg/m2 | INTRAVENOUS | Status: DC
  Administered 2019-07-06: 4100 mg via INTRAVENOUS
  Filled 2019-07-06: qty 82

## 2019-07-06 MED ORDER — PALONOSETRON HCL INJECTION 0.25 MG/5ML
0.2500 mg | Freq: Once | INTRAVENOUS | Status: AC
  Administered 2019-07-06: 0.25 mg via INTRAVENOUS

## 2019-07-06 MED ORDER — SODIUM CHLORIDE 0.9% FLUSH
10.0000 mL | Freq: Once | INTRAVENOUS | Status: AC
  Administered 2019-07-06: 10 mL
  Filled 2019-07-06: qty 10

## 2019-07-06 MED ORDER — ATROPINE SULFATE 1 MG/ML IJ SOLN
INTRAMUSCULAR | Status: AC
  Filled 2019-07-06: qty 1

## 2019-07-06 MED ORDER — IRINOTECAN HCL CHEMO INJECTION 100 MG/5ML
150.0000 mg/m2 | Freq: Once | INTRAVENOUS | Status: AC
  Administered 2019-07-06: 300 mg via INTRAVENOUS
  Filled 2019-07-06: qty 15

## 2019-07-06 NOTE — Patient Instructions (Signed)
[place] [facility] Discharge Instructions for Patients Receiving Chemotherapy  Today you received the following chemotherapy agents oxaliplatin, irinotecan, leucovorin, and fluorouracil.  To help prevent nausea and vomiting after your treatment, we encourage you to take your nausea medication as directed.   If you develop nausea and vomiting that is not controlled by your nausea medication, call the clinic.   BELOW ARE SYMPTOMS THAT SHOULD BE REPORTED IMMEDIATELY:  *FEVER GREATER THAN 100.5 F  *CHILLS WITH OR WITHOUT FEVER  NAUSEA AND VOMITING THAT IS NOT CONTROLLED WITH YOUR NAUSEA MEDICATION  *UNUSUAL SHORTNESS OF BREATH  *UNUSUAL BRUISING OR BLEEDING  TENDERNESS IN MOUTH AND THROAT WITH OR WITHOUT PRESENCE OF ULCERS  *URINARY PROBLEMS  *BOWEL PROBLEMS  UNUSUAL RASH Items with * indicate a potential emergency and should be followed up as soon as possible.  Feel free to call the clinic should you have any questions or concerns. The clinic phone number is [phone].  Please show the [facility] at check-in to the Emergency Department and triage nurse.

## 2019-07-06 NOTE — Telephone Encounter (Signed)
Per Ned Card NP called lab and asked them to add MAG for patient and they said that they would.

## 2019-07-06 NOTE — Telephone Encounter (Signed)
Per Ned Card NP called patient to let her know magnesium is at the low end of normal range. Continue magnesium as she is currently taking.

## 2019-07-06 NOTE — Patient Instructions (Signed)

## 2019-07-06 NOTE — Progress Notes (Signed)
[facility] OFFICE PROGRESS NOTE   Diagnosis: Pancreas cancer  INTERVAL HISTORY:   [facility] Ross returns as scheduled.  She completed cycle 5 FOLFIRINOX 06/22/2019.  She denies nausea/vomiting.  No mouth sores.  She tends to have 1 episode of loose stools on day 2 or 3.  She takes Imodium with good relief.  Cold sensitivity typically lasts for 5 days.  No numbness or tingling unrelated to cold exposure.  Objective:  Vital signs in last 24 hours:  Blood pressure (!) 147/99, pulse 90, temperature 98 F (36.7 C), temperature source Temporal, resp. rate 16, height 5\' 8"  (1.727 m), weight 194 lb 14.4 oz (88.4 kg), last menstrual period 09/17/2012, SpO2 100 %.    HEENT: No thrush or ulcers. GI: Abdomen soft and nontender.  No hepatomegaly. Vascular: No leg edema.  Skin: Palms without erythema. Port-A-Cath without erythema.   Lab Results:  Lab Results  Component Value Date   WBC 4.8 07/06/2019   HGB 9.9 (L) 07/06/2019   HCT 30.8 (L) 07/06/2019   MCV 90.6 07/06/2019   PLT 329 07/06/2019   NEUTROABS 1.8 07/06/2019    Imaging:  No results found.  Medications: I have reviewed the patient's current medications.  Assessment/Plan: 1. 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 Tumor invades the duodenal wall and vascular groove, resection margins negative, 4/34 lymph nodes positive ? 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 CTs 01/29/2019-ill-defined pancreas head mass, 5 pulmonary nodules-1 with a small amount of central cavitation, tumor abuts the left margin of the portal vein indistinct density surrounding, hepatic artery, complex cystic lesion of the right kidney, right adrenal mass-characterized as an adenoma on a [facility] MRI 12/21/2018 ? Netspot 03/03/2019-no focal pancreas activity, no tracer accumulation within the suspicious pulmonary nodules, left uterine mass with tracer  accumulation felt to represent a leiomyoma ? Elevated preoperative CA 19-9 ? CT chest 04/16/2019-multiple bilateral pulmonary nodules, some with increased cavitation, stable in size ? Cycle 1 FOLFIRINOX 04/27/2019 ? Cycle 2 FOLFIRINOX 05/11/2019 ? Cycle 3 FOLFIRINOX 05/23/2019 ? Cycle 4 FOLFIRINOX 06/08/2019 ? Cycle 5 FOLFIRINOX 06/22/2019 ? CT chest 07/02/2019-stable size of bilateral pulmonary nodules.  Dominant cavitary lesions in both lungs show increased cavitation with thinner walls.  Stable 2.1 cm right adrenal nodule. ? Cycle 6 FOLFIRINOX 07/06/2019  2. 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 Diabetes 4. Hypertension 5. Family history of pancreas cancer 6. Port-A-Cath placement, Dr. Barry Dienes, 04/21/2019 7. Oxaliplatin neuropathy  Disposition: Ms. Noller appears stable.  She has completed 5 cycles of FOLFIRINOX.  Recent restaging chest CT showed stable bilateral lung nodules, some with increased cavitation.  There were no new lung nodules.  We reviewed the results with her at today's visit.  Dr. Benay Spice recommends continuation of FOLFIRINOX, cycle 6 today, with the plan to repeat a chest CT in 2 months.  She agrees with this plan.  We reviewed the CBC and chemistry panel from today.  Labs adequate to proceed with treatment.  Of note, transaminases are mildly elevated.  This is likely related to the chemotherapy.  We will continue to monitor.  She will return for lab, follow-up, cycle 7 FOLFIRINOX in 2 weeks.  She will contact the office in the interim with any problems.  Patient seen with Dr. Benay Spice.  CT images reviewed on the computer with Ms. Pates.    Ned Card ANP/GNP-BC   07/06/2019  12:13 PM  This was a shared visit with Ned Card.  Ms. Grabert has completed 5 cycles of FOLFIRINOX.  She continues to tolerate the treatment well.  We discussed the CT findings and reviewed the images with her.  The lung nodules appear stable in size and a more cavitary.  It remains  unclear whether these represent metastases versus benign nodules.  I favor these represent metastatic disease.  The plan is to continue FOLFIRINOX and repeat a chest CT in approximately 2 months.  I will present her case at the GI tumor conference to review the CT images.  Julieanne Manson, MD

## 2019-07-08 ENCOUNTER — Other Ambulatory Visit: Payer: Self-pay

## 2019-07-08 ENCOUNTER — Telehealth: Payer: Self-pay | Admitting: Oncology

## 2019-07-08 ENCOUNTER — Inpatient Hospital Stay: Payer: BC Managed Care – PPO

## 2019-07-08 VITALS — BP 104/77 | HR 118

## 2019-07-08 DIAGNOSIS — I10 Essential (primary) hypertension: Secondary | ICD-10-CM | POA: Diagnosis not present

## 2019-07-08 DIAGNOSIS — Z85528 Personal history of other malignant neoplasm of kidney: Secondary | ICD-10-CM | POA: Diagnosis not present

## 2019-07-08 DIAGNOSIS — C250 Malignant neoplasm of head of pancreas: Secondary | ICD-10-CM

## 2019-07-08 DIAGNOSIS — Z905 Acquired absence of kidney: Secondary | ICD-10-CM | POA: Diagnosis not present

## 2019-07-08 DIAGNOSIS — Z5111 Encounter for antineoplastic chemotherapy: Secondary | ICD-10-CM | POA: Diagnosis not present

## 2019-07-08 DIAGNOSIS — E119 Type 2 diabetes mellitus without complications: Secondary | ICD-10-CM | POA: Diagnosis not present

## 2019-07-08 DIAGNOSIS — C7A8 Other malignant neuroendocrine tumors: Secondary | ICD-10-CM | POA: Diagnosis not present

## 2019-07-08 MED ORDER — SODIUM CHLORIDE 0.9% FLUSH
10.0000 mL | INTRAVENOUS | Status: DC | PRN
  Administered 2019-07-08: 10 mL
  Filled 2019-07-08: qty 10

## 2019-07-08 MED ORDER — HEPARIN SOD (PORK) LOCK FLUSH 100 UNIT/ML IV SOLN
500.0000 [IU] | Freq: Once | INTRAVENOUS | Status: AC | PRN
  Administered 2019-07-08: 500 [IU]
  Filled 2019-07-08: qty 5

## 2019-07-08 NOTE — Telephone Encounter (Signed)
Scheduled per los. Called and left msg. Mailed printout  °

## 2019-07-16 ENCOUNTER — Other Ambulatory Visit: Payer: Self-pay | Admitting: Internal Medicine

## 2019-07-16 ENCOUNTER — Other Ambulatory Visit: Payer: BC Managed Care – PPO

## 2019-07-17 ENCOUNTER — Encounter: Payer: BLUE CROSS/BLUE SHIELD | Admitting: Internal Medicine

## 2019-07-18 ENCOUNTER — Other Ambulatory Visit: Payer: Self-pay | Admitting: Oncology

## 2019-07-20 ENCOUNTER — Encounter: Payer: BC Managed Care – PPO | Admitting: Internal Medicine

## 2019-07-21 ENCOUNTER — Inpatient Hospital Stay: Payer: BC Managed Care – PPO | Attending: Genetic Counselor

## 2019-07-21 ENCOUNTER — Other Ambulatory Visit: Payer: Self-pay

## 2019-07-21 ENCOUNTER — Encounter: Payer: Self-pay | Admitting: Internal Medicine

## 2019-07-21 ENCOUNTER — Inpatient Hospital Stay: Payer: BC Managed Care – PPO

## 2019-07-21 ENCOUNTER — Inpatient Hospital Stay: Payer: BC Managed Care – PPO | Admitting: Oncology

## 2019-07-21 VITALS — BP 132/70 | HR 90 | Temp 98.0°F | Resp 17 | Ht 68.0 in | Wt 198.4 lb

## 2019-07-21 DIAGNOSIS — C187 Malignant neoplasm of sigmoid colon: Secondary | ICD-10-CM | POA: Diagnosis not present

## 2019-07-21 DIAGNOSIS — Z85528 Personal history of other malignant neoplasm of kidney: Secondary | ICD-10-CM | POA: Diagnosis not present

## 2019-07-21 DIAGNOSIS — Z5111 Encounter for antineoplastic chemotherapy: Secondary | ICD-10-CM | POA: Diagnosis not present

## 2019-07-21 DIAGNOSIS — C250 Malignant neoplasm of head of pancreas: Secondary | ICD-10-CM

## 2019-07-21 DIAGNOSIS — Z905 Acquired absence of kidney: Secondary | ICD-10-CM | POA: Diagnosis not present

## 2019-07-21 DIAGNOSIS — I10 Essential (primary) hypertension: Secondary | ICD-10-CM | POA: Diagnosis not present

## 2019-07-21 DIAGNOSIS — C7A8 Other malignant neuroendocrine tumors: Secondary | ICD-10-CM | POA: Insufficient documentation

## 2019-07-21 DIAGNOSIS — E119 Type 2 diabetes mellitus without complications: Secondary | ICD-10-CM | POA: Diagnosis not present

## 2019-07-21 DIAGNOSIS — Z95828 Presence of other vascular implants and grafts: Secondary | ICD-10-CM

## 2019-07-21 LAB — CBC WITH DIFFERENTIAL (CANCER CENTER ONLY)
Abs Immature Granulocytes: 0.01 10*3/uL (ref 0.00–0.07)
Basophils Absolute: 0 10*3/uL (ref 0.0–0.1)
Basophils Relative: 1 %
Eosinophils Absolute: 0.1 10*3/uL (ref 0.0–0.5)
Eosinophils Relative: 1 %
HCT: 30.1 % — ABNORMAL LOW (ref 36.0–46.0)
Hemoglobin: 9.7 g/dL — ABNORMAL LOW (ref 12.0–15.0)
Immature Granulocytes: 0 %
Lymphocytes Relative: 47 %
Lymphs Abs: 2.5 10*3/uL (ref 0.7–4.0)
MCH: 29.7 pg (ref 26.0–34.0)
MCHC: 32.2 g/dL (ref 30.0–36.0)
MCV: 92 fL (ref 80.0–100.0)
Monocytes Absolute: 0.6 10*3/uL (ref 0.1–1.0)
Monocytes Relative: 11 %
Neutro Abs: 2.2 10*3/uL (ref 1.7–7.7)
Neutrophils Relative %: 40 %
Platelet Count: 291 10*3/uL (ref 150–400)
RBC: 3.27 MIL/uL — ABNORMAL LOW (ref 3.87–5.11)
RDW: 15.4 % (ref 11.5–15.5)
WBC Count: 5.3 10*3/uL (ref 4.0–10.5)
nRBC: 0 % (ref 0.0–0.2)

## 2019-07-21 LAB — MAGNESIUM: Magnesium: 1.6 mg/dL — ABNORMAL LOW (ref 1.7–2.4)

## 2019-07-21 LAB — CMP (CANCER CENTER ONLY)
ALT: 58 U/L — ABNORMAL HIGH (ref 0–44)
AST: 44 U/L — ABNORMAL HIGH (ref 15–41)
Albumin: 3.5 g/dL (ref 3.5–5.0)
Alkaline Phosphatase: 155 U/L — ABNORMAL HIGH (ref 38–126)
Anion gap: 10 (ref 5–15)
BUN: 16 mg/dL (ref 6–20)
CO2: 27 mmol/L (ref 22–32)
Calcium: 9.2 mg/dL (ref 8.9–10.3)
Chloride: 107 mmol/L (ref 98–111)
Creatinine: 0.91 mg/dL (ref 0.44–1.00)
GFR, Est AFR Am: >60 mL/min (ref >60)
GFR, Estimated: >60 mL/min (ref >60)
Glucose, Bld: 165 mg/dL — ABNORMAL HIGH (ref 70–99)
Potassium: 3.5 mmol/L (ref 3.5–5.1)
Sodium: 144 mmol/L (ref 135–145)
Total Bilirubin: 0.3 mg/dL (ref 0.3–1.2)
Total Protein: 6.8 g/dL (ref 6.5–8.1)

## 2019-07-21 MED ORDER — IRINOTECAN HCL CHEMO INJECTION 100 MG/5ML
150.0000 mg/m2 | Freq: Once | INTRAVENOUS | Status: AC
  Administered 2019-07-21: 300 mg via INTRAVENOUS
  Filled 2019-07-21: qty 15

## 2019-07-21 MED ORDER — SODIUM CHLORIDE 0.9 % IV SOLN
Freq: Once | INTRAVENOUS | Status: AC
  Filled 2019-07-21: qty 5

## 2019-07-21 MED ORDER — SODIUM CHLORIDE 0.9% FLUSH
10.0000 mL | Freq: Once | INTRAVENOUS | Status: AC
  Administered 2019-07-21: 10:00:00 10 mL
  Filled 2019-07-21: qty 10

## 2019-07-21 MED ORDER — LEUCOVORIN CALCIUM INJECTION 350 MG
400.0000 mg/m2 | Freq: Once | INTRAVENOUS | Status: AC
  Administered 2019-07-21: 824 mg via INTRAVENOUS
  Filled 2019-07-21: qty 41.2

## 2019-07-21 MED ORDER — ATROPINE SULFATE 1 MG/ML IJ SOLN
INTRAMUSCULAR | Status: AC
  Filled 2019-07-21: qty 1

## 2019-07-21 MED ORDER — SODIUM CHLORIDE 0.9 % IV SOLN
2000.0000 mg/m2 | INTRAVENOUS | Status: DC
  Administered 2019-07-21: 4100 mg via INTRAVENOUS
  Filled 2019-07-21: qty 82

## 2019-07-21 MED ORDER — ATROPINE SULFATE 1 MG/ML IJ SOLN
0.5000 mg | Freq: Once | INTRAMUSCULAR | Status: AC | PRN
  Administered 2019-07-21: 0.5 mg via INTRAVENOUS

## 2019-07-21 MED ORDER — PALONOSETRON HCL INJECTION 0.25 MG/5ML
0.2500 mg | Freq: Once | INTRAVENOUS | Status: AC
  Administered 2019-07-21: 0.25 mg via INTRAVENOUS

## 2019-07-21 MED ORDER — PALONOSETRON HCL INJECTION 0.25 MG/5ML
INTRAVENOUS | Status: AC
  Filled 2019-07-21: qty 5

## 2019-07-21 MED ORDER — DEXTROSE 5 % IV SOLN
Freq: Once | INTRAVENOUS | Status: AC
  Filled 2019-07-21: qty 250

## 2019-07-21 MED ORDER — DEXTROSE 5 % IV SOLN
Freq: Once | INTRAVENOUS | Status: DC
  Filled 2019-07-21: qty 250

## 2019-07-21 MED ORDER — OXALIPLATIN CHEMO INJECTION 100 MG/20ML
85.0000 mg/m2 | Freq: Once | INTRAVENOUS | Status: AC
  Administered 2019-07-21: 175 mg via INTRAVENOUS
  Filled 2019-07-21: qty 35

## 2019-07-21 NOTE — Progress Notes (Signed)
[place] OFFICE PROGRESS NOTE   Diagnosis: Pancreas cancer  INTERVAL HISTORY:   [facility] Ross completed another cycle of FOLFIRINOX on 07/06/2019.  She had one episode of diarrhea each of these 3 and day 4.  The diarrhea resolved.  No nausea or vomiting.  She has cold sensitivity and peripheral tingling following chemotherapy.  This has resolved.  No neuropathy symptoms at present.  She has malaise for several days to chemotherapy.  Objective:  Vital signs in last 24 hours:  Blood pressure 132/70, pulse 90, temperature 98 F (36.7 C), temperature source Temporal, resp. rate 17, height 5' 8"  (1.727 m), weight 198 lb 6.4 oz (90 kg), last menstrual period 09/17/2012, SpO2 100 %.    HEENT: No thrush or ulcers, hyperpigmentation of the tongue GI: No hepatosplenomegaly, no mass, nontender Vascular: No leg edema Neuro: Mild to moderate loss of vibratory sense at the fingertips bilaterally Skin: Mild hyperpigmentation of the hands  Portacath/PICC-without erythema  Lab Results:  Lab Results  Component Value Date   WBC 5.3 07/21/2019   HGB 9.7 (L) 07/21/2019   HCT 30.1 (L) 07/21/2019   MCV 92.0 07/21/2019   PLT 291 07/21/2019   NEUTROABS 2.2 07/21/2019    CMP  Lab Results  Component Value Date   NA 144 07/21/2019   K 3.5 07/21/2019   CL 107 07/21/2019   CO2 27 07/21/2019   GLUCOSE 165 (H) 07/21/2019   BUN 16 07/21/2019   CREATININE 0.91 07/21/2019   CALCIUM 9.2 07/21/2019   PROT 6.8 07/21/2019   ALBUMIN 3.5 07/21/2019   AST 44 (H) 07/21/2019   ALT 58 (H) 07/21/2019   ALKPHOS 155 (H) 07/21/2019   BILITOT 0.3 07/21/2019   GFRNONAA >60 07/21/2019   GFRAA >60 07/21/2019     Medications: I have reviewed the patient's current medications.   Assessment/Plan: 1. 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 Tumor invades the duodenal wall and vascular groove, resection margins negative, 4/34 lymph nodes  positive ? MSI-stable, tumor showed instability in 2 loci as did adjacent normal tissue ? 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 CTs 01/29/2019-ill-defined pancreas head mass, 5 pulmonary nodules-1 with a small amount of central cavitation, tumor abuts the left margin of the portal vein indistinct density surrounding, hepatic artery, complex cystic lesion of the right kidney, right adrenal mass-characterized as an adenoma on a [facility] MRI 12/21/2018 ? 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 Elevated preoperative CA 19-9 ? CT chest 04/16/2019-multiple bilateral pulmonary nodules, some with increased cavitation, stable in size ? Cycle 1 FOLFIRINOX 04/27/2019 ? Cycle 2 FOLFIRINOX 05/11/2019 ? Cycle 3 FOLFIRINOX 05/23/2019 ? Cycle 4 FOLFIRINOX 06/08/2019 ? Cycle 5 FOLFIRINOX 06/22/2019 ? CT chest 07/02/2019-stable size of bilateral pulmonary nodules.  Dominant cavitary lesions in both lungs show increased cavitation with thinner walls.  Stable 2.1 cm right adrenal nodule. ? Cycle 6 FOLFIRINOX 07/06/2019 ? Cycle 7 FOLFIRINOX 07/21/2019  2. 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 Diabetes 4. Hypertension 5. Family history of pancreas cancer, INVITAE panel-VUS in the TERT 6. Port-A-Cath placement, Dr. Barry Dienes, 04/21/2019 7. Oxaliplatin neuropathy    Disposition: A[facility]a Ross appears unchanged.  She continues to tolerate the FOLFIRINOX well.  She will complete cycle 7 today.  She has mild oxaliplatin neuropathy.  We will monitor the neuropathy symptoms and her physical examination and discontinue oxaliplatin if  the progresses.  Her case was presented at the GI tumor conference 2 weeks ago.  We reviewed the chest CT images.  It remains unclear whether the lung nodules represent metastases or a  metastatic finding.  The plan is to repeat a chest CT at a 74-monthinterval.  I will contact Dr. MLisbeth Renshawto discuss the indication for proceeding with adjuvant radiation after 8 cycles of FOLFIRINOX.  GBetsy Coder MD  07/21/2019  10:32 AM

## 2019-07-21 NOTE — Patient Instructions (Signed)
[facility] Discharge Instructions for Patients Receiving Chemotherapy  Today you received the following chemotherapy agents: Oxaliplatin, Leucovorin, Irinotecan, 5FU  To help prevent nausea and vomiting after your treatment, we encourage you to take your nausea medication as directed.   If you develop nausea and vomiting that is not controlled by your nausea medication, call the clinic.   BELOW ARE SYMPTOMS THAT SHOULD BE REPORTED IMMEDIATELY:  *FEVER GREATER THAN 100.5 F  *CHILLS WITH OR WITHOUT FEVER  NAUSEA AND VOMITING THAT IS NOT CONTROLLED WITH YOUR NAUSEA MEDICATION  *UNUSUAL SHORTNESS OF BREATH  *UNUSUAL BRUISING OR BLEEDING  TENDERNESS IN MOUTH AND THROAT WITH OR WITHOUT PRESENCE OF ULCERS  *URINARY PROBLEMS  *BOWEL PROBLEMS  UNUSUAL RASH Items with * indicate a potential emergency and should be followed up as soon as possible.  Feel free to call the clinic should you have any questions or concerns. The clinic phone number is [phone].  Please show the CHEMO ALERT CARD at check-in to the Emergency Department and triage nurse.   

## 2019-07-22 ENCOUNTER — Ambulatory Visit: Payer: BC Managed Care – PPO | Admitting: Endocrinology

## 2019-07-23 ENCOUNTER — Inpatient Hospital Stay: Payer: BC Managed Care – PPO

## 2019-07-23 ENCOUNTER — Other Ambulatory Visit: Payer: Self-pay

## 2019-07-23 VITALS — BP 132/72 | HR 78 | Temp 98.2°F | Resp 18

## 2019-07-23 DIAGNOSIS — C250 Malignant neoplasm of head of pancreas: Secondary | ICD-10-CM | POA: Diagnosis not present

## 2019-07-23 DIAGNOSIS — E119 Type 2 diabetes mellitus without complications: Secondary | ICD-10-CM | POA: Diagnosis not present

## 2019-07-23 DIAGNOSIS — C7A8 Other malignant neuroendocrine tumors: Secondary | ICD-10-CM | POA: Diagnosis not present

## 2019-07-23 DIAGNOSIS — I10 Essential (primary) hypertension: Secondary | ICD-10-CM | POA: Diagnosis not present

## 2019-07-23 DIAGNOSIS — Z85528 Personal history of other malignant neoplasm of kidney: Secondary | ICD-10-CM | POA: Diagnosis not present

## 2019-07-23 DIAGNOSIS — Z5111 Encounter for antineoplastic chemotherapy: Secondary | ICD-10-CM | POA: Diagnosis not present

## 2019-07-23 DIAGNOSIS — Z905 Acquired absence of kidney: Secondary | ICD-10-CM | POA: Diagnosis not present

## 2019-07-23 MED ORDER — HEPARIN SOD (PORK) LOCK FLUSH 100 UNIT/ML IV SOLN
500.0000 [IU] | Freq: Once | INTRAVENOUS | Status: AC | PRN
  Administered 2019-07-23: 15:00:00 500 [IU]
  Filled 2019-07-23: qty 5

## 2019-07-23 MED ORDER — SODIUM CHLORIDE 0.9% FLUSH
10.0000 mL | INTRAVENOUS | Status: DC | PRN
  Administered 2019-07-23: 10 mL
  Filled 2019-07-23: qty 10

## 2019-07-26 ENCOUNTER — Encounter: Payer: Self-pay | Admitting: Internal Medicine

## 2019-07-26 ENCOUNTER — Other Ambulatory Visit: Payer: Self-pay

## 2019-07-26 ENCOUNTER — Ambulatory Visit: Payer: BC Managed Care – PPO | Admitting: Internal Medicine

## 2019-07-26 VITALS — BP 122/94 | HR 95 | Temp 97.9°F | Ht 67.8 in | Wt 192.8 lb

## 2019-07-26 DIAGNOSIS — I10 Essential (primary) hypertension: Secondary | ICD-10-CM

## 2019-07-26 DIAGNOSIS — E1165 Type 2 diabetes mellitus with hyperglycemia: Secondary | ICD-10-CM

## 2019-07-26 DIAGNOSIS — Z794 Long term (current) use of insulin: Secondary | ICD-10-CM

## 2019-07-26 DIAGNOSIS — Z0000 Encounter for general adult medical examination without abnormal findings: Secondary | ICD-10-CM

## 2019-07-26 DIAGNOSIS — H6122 Impacted cerumen, left ear: Secondary | ICD-10-CM

## 2019-07-26 DIAGNOSIS — C7A8 Other malignant neuroendocrine tumors: Secondary | ICD-10-CM | POA: Diagnosis not present

## 2019-07-26 LAB — POCT UA - MICROALBUMIN
Creatinine, POC: 50 mg/dL
Microalbumin Ur, POC: 10 mg/L

## 2019-07-26 LAB — POCT URINALYSIS DIPSTICK
Bilirubin, UA: NEGATIVE
Blood, UA: NEGATIVE
Glucose, UA: POSITIVE — AB
Ketones, UA: NEGATIVE
Nitrite, UA: NEGATIVE
Protein, UA: NEGATIVE
Spec Grav, UA: 1.01 (ref 1.010–1.025)
Urobilinogen, UA: 0.2 E.U./dL
pH, UA: 5.5 (ref 5.0–8.0)

## 2019-07-26 NOTE — Patient Instructions (Signed)
Health Maintenance, Female Adopting a healthy lifestyle and getting preventive care are important in promoting health and wellness. Ask your health care provider about:  The right schedule for you to have regular tests and exams.  Things you can do on your own to prevent diseases and keep yourself healthy. What should I know about diet, weight, and exercise? Eat a healthy diet   Eat a diet that includes plenty of vegetables, fruits, low-fat dairy products, and lean protein.  Do not eat a lot of foods that are high in solid fats, added sugars, or sodium. Maintain a healthy weight Body mass index (BMI) is used to identify weight problems. It estimates body fat based on height and weight. Your health care provider can help determine your BMI and help you achieve or maintain a healthy weight. Get regular exercise Get regular exercise. This is one of the most important things you can do for your health. Most adults should:  Exercise for at least 150 minutes each week. The exercise should increase your heart rate and make you sweat (moderate-intensity exercise).  Do strengthening exercises at least twice a week. This is in addition to the moderate-intensity exercise.  Spend less time sitting. Even light physical activity can be beneficial. Watch cholesterol and blood lipids Have your blood tested for lipids and cholesterol at 54 years of age, then have this test every 5 years. Have your cholesterol levels checked more often if:  Your lipid or cholesterol levels are high.  You are older than 54 years of age.  You are at high risk for heart disease. What should I know about cancer screening? Depending on your health history and family history, you may need to have cancer screening at various ages. This may include screening for:  Breast cancer.  Cervical cancer.  Colorectal cancer.  Skin cancer.  Lung cancer. What should I know about heart disease, diabetes, and high blood  pressure? Blood pressure and heart disease  High blood pressure causes heart disease and increases the risk of stroke. This is more likely to develop in people who have high blood pressure readings, are of African descent, or are overweight.  Have your blood pressure checked: ? Every 3-5 years if you are 18-39 years of age. ? Every year if you are 40 years old or older. Diabetes Have regular diabetes screenings. This checks your fasting blood sugar level. Have the screening done:  Once every three years after age 40 if you are at a normal weight and have a low risk for diabetes.  More often and at a younger age if you are overweight or have a high risk for diabetes. What should I know about preventing infection? Hepatitis B If you have a higher risk for hepatitis B, you should be screened for this virus. Talk with your health care provider to find out if you are at risk for hepatitis B infection. Hepatitis C Testing is recommended for:  Everyone born from 1945 through 1965.  Anyone with known risk factors for hepatitis C. Sexually transmitted infections (STIs)  Get screened for STIs, including gonorrhea and chlamydia, if: ? You are sexually active and are younger than 54 years of age. ? You are older than 54 years of age and your health care provider tells you that you are at risk for this type of infection. ? Your sexual activity has changed since you were last screened, and you are at increased risk for chlamydia or gonorrhea. Ask your health care provider if   you are at risk.  Ask your health care provider about whether you are at high risk for HIV. Your health care provider may recommend a prescription medicine to help prevent HIV infection. If you choose to take medicine to prevent HIV, you should first get tested for HIV. You should then be tested every 3 months for as long as you are taking the medicine. Pregnancy  If you are about to stop having your period (premenopausal) and  you may become pregnant, seek counseling before you get pregnant.  Take 400 to 800 micrograms (mcg) of folic acid every day if you become pregnant.  Ask for birth control (contraception) if you want to prevent pregnancy. Osteoporosis and menopause Osteoporosis is a disease in which the bones lose minerals and strength with aging. This can result in bone fractures. If you are 65 years old or older, or if you are at risk for osteoporosis and fractures, ask your health care provider if you should:  Be screened for bone loss.  Take a calcium or vitamin D supplement to lower your risk of fractures.  Be given hormone replacement therapy (HRT) to treat symptoms of menopause. Follow these instructions at home: Lifestyle  Do not use any products that contain nicotine or tobacco, such as cigarettes, e-cigarettes, and chewing tobacco. If you need help quitting, ask your health care provider.  Do not use street drugs.  Do not share needles.  Ask your health care provider for help if you need support or information about quitting drugs. Alcohol use  Do not drink alcohol if: ? Your health care provider tells you not to drink. ? You are pregnant, may be pregnant, or are planning to become pregnant.  If you drink alcohol: ? Limit how much you use to 0-1 drink a day. ? Limit intake if you are breastfeeding.  Be aware of how much alcohol is in your drink. In the [place] one drink equals one 12 oz bottle of beer (355 mL), one 5 oz glass of wine (148 mL), or one 1 oz glass of hard liquor (44 mL). General instructions  Schedule regular health, dental, and eye exams.  Stay current with your vaccines.  Tell your health care provider if: ? You often feel depressed. ? You have ever been abused or do not feel safe at home. Summary  Adopting a healthy lifestyle and getting preventive care are important in promoting health and wellness.  Follow your health care provider's instructions about healthy  diet, exercising, and getting tested or screened for diseases.  Follow your health care provider's instructions on monitoring your cholesterol and blood pressure. This information is not intended to replace advice given to you by your health care provider. Make sure you discuss any questions you have with your health care provider. Document Revised: 05/20/2018 Document Reviewed: 05/20/2018 Elsevier Patient Education  2020 [employer]  

## 2019-07-26 NOTE — Progress Notes (Signed)
This visit occurred during the SARS-CoV-2 public health emergency.  Safety protocols were in place, including screening questions prior to the visit, additional usage of staff PPE, and extensive cleaning of exam room while observing appropriate contact time as indicated for disinfecting solutions.  Subjective:     Patient ID: Alyssa Ross , female    DOB: 16-May-1966 , 54 y.o.   MRN: [id number]   Chief Complaint  Patient presents with  . Annual Exam    HPI  She is here today for a full physical examination. She is followed by Dr. Charlesetta Garibaldi for her GYN care.  Since her last visit, she has been diagnosed with malignant pancreatic cancer.  She is s/p pancreaticoduodenectomy on 03/04/2019. She has since completed seven cycles of chemotherapy with Folfirinox. She reports feeling fairly well and is optimistic about her future.     Diabetes She presents for her follow-up diabetic visit. She has type 2 diabetes mellitus. There are no hypoglycemic associated symptoms. Pertinent negatives for diabetes include no blurred vision and no chest pain. There are no hypoglycemic complications. Risk factors for coronary artery disease include diabetes mellitus, hypertension and dyslipidemia. Current diabetic treatment includes insulin injections. She is compliant with treatment most of the time. She is following a diabetic diet. She participates in exercise intermittently. Eye exam is current.  Hypertension This is a chronic problem. The current episode started more than 1 year ago. The problem has been gradually improving since onset. The problem is uncontrolled. Pertinent negatives include no blurred vision, chest pain, palpitations or shortness of breath. Past treatments include ACE inhibitors and diuretics. The current treatment provides moderate improvement.     Past Medical History:  Diagnosis Date  . Anemia   . ASCUS (atypical squamous cells of undetermined significance) on Pap smear 08/06/1999  . Breast  mass in female 2002   Left  . Cancer [facility])    Neuroendocrine Tumor of the pancreas  . Diabetes mellitus without complication ([place])   . Family history of pancreatic cancer   . Fibroid uterus 2010  . Hypertension   . Irregular bleeding 2011  . 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 PONV (postoperative nausea and vomiting)    nausea  vomitting after 12/25/18 ERCP  . 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 Yeast vaginitis 2006     Family History  Problem Relation Age of Onset  . Diabetes Mother   . Dementia Mother   . Hyperlipidemia Mother   . Hypertension Mother   . Irregular heart beat Mother   . Diabetes Father   . Hypertension Father   . Hyperlipidemia Father   . Down syndrome Sister   . Diabetes Sister   . Hyperlipidemia Brother   . Heart Problems Brother   . Goiter Maternal Aunt   . Thyroid nodules Sister   . Cancer Cousin 60       eye; maternal first cousin  . Goiter Cousin   . Cancer Paternal Aunt        unknown form of cancer  . Cancer Cousin        unknown form of cancer; paternal first cousin  . Pancreatic cancer Cousin 51       paternal first cousin  . Cancer Cousin 15       unknown cancer; paternal first cousin  . Cancer Cousin 48       unknown cancer; paternal first cousin     Current Outpatient Medications:  .  Continuous Blood Gluc  Sensor (FREESTYLE LIBRE 14 DAY SENSOR) MISC, APPLY SENSOR TO BODY ONCE EVERY 14 DAYS TO MONITOR BLOOD GLUCOSE, Disp: 4 each, Rfl: 3 .  Insulin Degludec (TRESIBA FLEXTOUCH [place]), Inject 14-60 Units into the skin at bedtime. , Disp: , Rfl:  .  insulin lispro (HUMALOG KWIKPEN) 100 UNIT/ML KwikPen, Inject 20 units under the skin three times daily before meals. (Patient taking differently: Inject 2-10 Units into the skin 3 (three) times daily as needed (high blood sugar or when eating carbs). ), Disp: 20 pen, Rfl: 3 .  Insulin Pen Needle (NOVOFINE PLUS) 32G X 4 MM MISC, Use 3 per day to inject  insulin, Disp: 300 each, Rfl: 1 .  KLOR-CON M20 20 MEQ tablet, TAKE 1 TABLET DAILY, Disp: 90 tablet, Rfl: 0 .  lidocaine-prilocaine (EMLA) cream, Apply 1 application topically as directed. Apply 1 hour prior to stick and cover with plastic wrap, Disp: 30 g, Rfl: 5 .  lisinopril-hydrochlorothiazide (ZESTORETIC) 10-12.5 MG tablet, TAKE 1 TABLET DAILY, Disp: 90 tablet, Rfl: 3 .  loratadine (CLARITIN) 10 MG tablet, Take 10 mg by mouth at bedtime. , Disp: , Rfl:  .  magnesium oxide (MAG-OX) 400 (241.3 Mg) MG tablet, Take 1 tablet (400 mg total) by mouth 2 (two) times daily., Disp: 60 tablet, Rfl: 2 .  metFORMIN (GLUCOPHAGE-XR) 750 MG 24 hr tablet, TAKE 3 TABLETS DAILY, Disp: 270 tablet, Rfl: 3 .  baclofen (LIORESAL) 10 MG tablet, Take 1 tablet (10 mg total) by mouth 3 (three) times daily as needed for muscle spasms. (Patient not taking: Reported on 07/26/2019), Disp: 30 each, Rfl: 0 .  Continuous Blood Gluc Receiver (FREESTYLE LIBRE 14 DAY READER) DEVI, USE READER TO CHECK BLOOD GLUCOSE WITH FREESTYLE LIBRE SENSORS., Disp: 1 Device, Rfl: 0 .  ondansetron (ZOFRAN) 8 MG tablet, Take 1 tablet (8 mg total) by mouth every 8 (eight) hours as needed for nausea or vomiting. Start 72 hours after chemo treatment (Patient not taking: Reported on 07/26/2019), Disp: 20 tablet, Rfl: 0 .  oxyCODONE (OXY IR/ROXICODONE) 5 MG immediate release tablet, Take 1-2 tablets (5-10 mg total) by mouth every 4 (four) hours as needed for moderate pain, severe pain or breakthrough pain. (Patient not taking: Reported on 07/21/2019), Disp: 30 tablet, Rfl: 0 .  pantoprazole (PROTONIX) 40 MG tablet, Take 1 tablet (40 mg total) by mouth daily at 12 noon. (Patient not taking: Reported on 07/21/2019), Disp: 30 tablet, Rfl: 1 .  prochlorperazine (COMPAZINE) 10 MG tablet, Take 1 tablet (10 mg total) by mouth every 6 (six) hours as needed. (Patient not taking: Reported on 07/21/2019), Disp: 60 tablet, Rfl: 1 .  traMADol (ULTRAM) 50 MG tablet, Take 1-2  tablets (50-100 mg total) by mouth every 6 (six) hours as needed for moderate pain or severe pain. (Patient not taking: Reported on 07/21/2019), Disp: 30 tablet, Rfl: 0 No current facility-administered medications for this visit.  Facility-Administered Medications Ordered in Other Visits:  .  sodium chloride flush (NS) 0.9 % injection 10 mL, 10 mL, Intracatheter, PRN, Ladell Pier, MD, 10 mL at 05/27/19 1342   Allergies  Allergen Reactions  . Cherry Rash  . Lemon Oil Rash     Review of Systems  Constitutional: Negative.   HENT: Negative.   Eyes: Negative.  Negative for blurred vision.  Respiratory: Negative.  Negative for shortness of breath.   Cardiovascular: Negative.  Negative for chest pain and palpitations.  Endocrine: Negative.   Genitourinary: Negative.   Musculoskeletal: Negative.   Skin: Negative.  Allergic/Immunologic: Negative.   Neurological: Negative.   Hematological: Negative.   Psychiatric/Behavioral: Negative.      Today's Vitals   07/26/19 1414  BP: (!) 122/94  Pulse: 95  Temp: 97.9 F (36.6 C)  TempSrc: Oral  Weight: 192 lb 12.8 oz (87.5 kg)  Height: 5' 7.8" (1.722 m)   Body mass index is 29.49 kg/m.   Objective:  Physical Exam Vitals and nursing note reviewed.  Constitutional:      Appearance: Normal appearance.  HENT:     Head: Normocephalic and atraumatic.     Right Ear: Tympanic membrane, ear canal and external ear normal.     Left Ear: Ear canal and external ear normal. There is impacted cerumen.     Ears:     Comments: Hard, impacted cerumen on left.     Nose:     Comments: Deferred, masked    Mouth/Throat:     Comments: Deferred, masked Eyes:     Extraocular Movements: Extraocular movements intact.     Conjunctiva/sclera: Conjunctivae normal.     Pupils: Pupils are equal, round, and reactive to light.  Cardiovascular:     Rate and Rhythm: Normal rate and regular rhythm.     Pulses: Normal pulses.     Heart sounds: Normal  heart sounds.  Pulmonary:     Effort: Pulmonary effort is normal.     Breath sounds: Normal breath sounds.  Chest:     Breasts: Tanner Score is 5.        Right: Normal.        Left: Normal.  Abdominal:     General: Bowel sounds are normal.     Palpations: Abdomen is soft.     Comments: Healed surgical scar  Genitourinary:    Comments: deferred Musculoskeletal:        General: Normal range of motion.     Cervical back: Normal range of motion and neck supple.  Skin:    General: Skin is warm and dry.  Neurological:     General: No focal deficit present.     Mental Status: She is alert and oriented to person, place, and time.  Psychiatric:        Mood and Affect: Mood normal.        Behavior: Behavior normal.         Assessment And Plan:     1. Routine general medical examination at health care facility  A full exam was performed.  Importance of monthly self breast exams was discussed with the patient. PATIENT IS ADVISED TO GET 30-45 MINUTES REGULAR EXERCISE NO LESS THAN FOUR TO FIVE DAYS PER WEEK - BOTH WEIGHTBEARING EXERCISES AND AEROBIC ARE RECOMMENDED.  SHE IS ADVISED TO FOLLOW A HEALTHY DIET WITH AT LEAST SIX FRUITS/VEGGIES PER DAY, DECREASE INTAKE OF RED MEAT, AND TO INCREASE FISH INTAKE TO TWO DAYS PER WEEK.  MEATS/FISH SHOULD NOT BE FRIED, BAKED OR BROILED IS PREFERABLE.  I SUGGEST WEARING SPF 50 SUNSCREEN ON EXPOSED PARTS AND ESPECIALLY WHEN IN THE DIRECT SUNLIGHT FOR AN EXTENDED PERIOD OF TIME.  PLEASE AVOID FAST FOOD RESTAURANTS AND INCREASE YOUR WATER INTAKE.  - Lipid panel - Hemoglobin A1c - TSH  2. Uncontrolled type 2 diabetes mellitus with hyperglycemia, with long-term current use of insulin ([place])  Diabetic foot exam was performed.  She will start taking one metformin in AM and 2 tablets PM to help improve glycemic control.  We also discussed cutting back on her carb intake to help her  achieve optimal glycemic levels.   I DISCUSSED WITH THE PATIENT AT LENGTH  REGARDING THE GOALS OF GLYCEMIC CONTROL AND POSSIBLE LONG-TERM COMPLICATIONS.  I  ALSO STRESSED THE IMPORTANCE OF COMPLIANCE WITH HOME GLUCOSE MONITORING, DIETARY RESTRICTIONS INCLUDING AVOIDANCE OF SUGARY DRINKS/PROCESSED FOODS,  ALONG WITH REGULAR EXERCISE.  I  ALSO STRESSED THE IMPORTANCE OF ANNUAL EYE EXAMS, SELF FOOT CARE AND COMPLIANCE WITH OFFICE VISITS.  - POCT Urinalysis Dipstick (81002) - POCT UA - Microalbumin  3. Essential hypertension, benign  Chronic, fair control. She has been taking 1/2 pill daily b/c she has found one pill daily drops her BP too low. She will see how one pill qod alternating with 1/2 pill qod works for her. EKG performed, NSR w/o acute changes. She will rto in six months for re-evaluation.   - EKG 12-Lead  4. 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 She will continue with management as per Dr. Benay Spice.    5. Left ear impacted cerumen  AFTER OBTAINING VERBAL CONSENT, LEFT EAR WAS FLUSHED BY IRRIGATION. SHE TOLERATED PROCEDURE WELL WITHOUT ANY COMPLICATIONS. NO TM ABNORMALITIES WERE NOTED.  - Ear Lavage    Maximino Greenland, MD    THE PATIENT IS ENCOURAGED TO PRACTICE SOCIAL DISTANCING DUE TO THE COVID-19 PANDEMIC.

## 2019-07-27 ENCOUNTER — Other Ambulatory Visit: Payer: BC Managed Care – PPO

## 2019-07-27 LAB — LIPID PANEL
Chol/HDL Ratio: 2.5 ratio (ref 0.0–4.4)
Cholesterol, Total: 190 mg/dL (ref 100–199)
HDL: 75 mg/dL (ref >39)
LDL Chol Calc (NIH): 91 mg/dL (ref 0–99)
Triglycerides: 139 mg/dL (ref 0–149)
VLDL Cholesterol Cal: 24 mg/dL (ref 5–40)

## 2019-07-27 LAB — HEMOGLOBIN A1C
Est. average glucose Bld gHb Est-mCnc: 200 mg/dL
Hgb A1c MFr Bld: 8.6 % — ABNORMAL HIGH (ref 4.8–5.6)

## 2019-07-27 LAB — TSH: TSH: 0.753 u[IU]/mL (ref 0.450–4.500)

## 2019-07-28 DIAGNOSIS — C187 Malignant neoplasm of sigmoid colon: Secondary | ICD-10-CM | POA: Diagnosis not present

## 2019-07-29 ENCOUNTER — Encounter: Payer: Self-pay | Admitting: Endocrinology

## 2019-07-29 ENCOUNTER — Ambulatory Visit (INDEPENDENT_AMBULATORY_CARE_PROVIDER_SITE_OTHER): Payer: BC Managed Care – PPO | Admitting: Endocrinology

## 2019-07-29 ENCOUNTER — Ambulatory Visit: Payer: BC Managed Care – PPO | Admitting: Endocrinology

## 2019-07-29 DIAGNOSIS — E1165 Type 2 diabetes mellitus with hyperglycemia: Secondary | ICD-10-CM

## 2019-07-29 DIAGNOSIS — Z794 Long term (current) use of insulin: Secondary | ICD-10-CM

## 2019-07-29 DIAGNOSIS — I10 Essential (primary) hypertension: Secondary | ICD-10-CM | POA: Diagnosis not present

## 2019-07-29 MED ORDER — INSULIN LISPRO PROT & LISPRO (75-25 MIX) 100 UNIT/ML KWIKPEN: PEN_INJECTOR | SUBCUTANEOUS | 0 refills | Status: DC

## 2019-07-29 NOTE — Progress Notes (Signed)
Patient ID: Alyssa Ross, female   DOB: Jul 08, 1965, 54 y.o.   MRN: [id number]  I connected with the above-named patient by video enabled telemedicine application and verified that I am speaking with the correct person. The patient was explained the limitations of evaluation and management by telemedicine and the availability of in person appointments.  Patient also understood that there may be a patient responsible charge related to this service . Location of the patient: Patient's home . Location of the provider: Physician office Only the patient and myself were participating in the encounter The patient understood the above statements and agreed to proceed.   Reason for Appointment: Diabetes follow-up    History of Present Illness   Diagnosis: Type 2 DIABETES MELITUS, date of diagnosis 2005   She had previously been on metformin and Amaryl Because of inadequate control she was given Victoza in addition in 2011 With this her blood sugars have been significantly better and her A1c has been as low as 6.2 in 2013 Her blood sugars have been more difficult to control since 2014 an A1c consistently over 7% She  was started on insulin with Levemir in 06/2013 because of  increase in her A1c to 8.5% She had previously been taking Victoza along with Amaryl and metformin without consistent control Also had difficulty losing weight despite taking 1.8 mg Victoza, also had tried the 3 mg dose Because of episodic nausea and diarrhea in 1/16 she went off her Victoza  RECENT history:   INSULIN dose: TRESIBA 15 units pm.  Humalog 5 or 6 units mostly at lunch  Non-insulin hypoglycemic drugs: on metformin 2250 mg,  A1c previously was 8.3, now 8.6   Current management, blood sugars and problems identified as follows  She claims that Humalog makes her abdominal area fat and she does not take much and only some at lunchtime  She is afraid of low sugars overnight so she does not take this at  dinnertime  Overall blood sugars are higher, previously was having 34% of blood sugars in target range and now only 26  Blood sugars are fairly consistently over 200 in the afternoons and evenings and as high as about 280 late evening  She is not motivated to plan her meals well and not sticking with her diet  Also not exercising  She thinks her weight is about the same  She thinks she is taking [place] every day and not forgetting this and is usually taking 15 units  FASTING blood sugars are overall high but show variability and only low normal once  Not clear if her freestyle Elenor Legato is accurate  She says her insurance will not give her more than 180 tablets of the Metformin for 67-month supply, has been recommended 2250 mg daily  As before she has not tolerated GLP-1 drugs long-term and refuses to take SGLT2 drugs  Also previously has refused insulin pump option     Side effects from medications: None   Monitors blood glucose: With freestyle Libre sensor  CGM use % of time  70  2-week average/SD  224, GV 30  Time in range     26   %  % Time Above 180  32  % Time above 250  41  % Time Below 70  1     PRE-MEAL Fasting Lunch Dinner Bedtime Overall  Glucose range:       Averages:  172  192  269     POST-MEAL PC Breakfast PC  Lunch PC Dinner  Glucose range:     Averages:   236  283     Previous data  CGM use % of time  64  Average and SD  204, GV 32  Time in range       34%  % Time Above 180  4 3   % Time above 250  21  % Time Below target  2    PRE-MEAL Fasting Lunch Dinner Bedtime Overall  Glucose range:       Mean/median:  134   218  251  204   POST-MEAL PC Breakfast PC Lunch PC Dinner  Glucose range:     Mean/median:  162  200  277       Meals: 3 meals per day. Dinner 8-9 pm     DIET: As above         Wt Readings from Last 3 Encounters:  07/26/19 192 lb 12.8 oz (87.5 kg)  07/21/19 198 lb 6.4 oz (90 kg)  07/06/19 194 lb 14.4 oz (88.4 kg)     LABS:  Lab Results  Component Value Date   HGBA1C 8.6 (H) 07/26/2019   HGBA1C 8.3 (H) 04/02/2019   HGBA1C 8.9 (H) 03/02/2019   Lab Results  Component Value Date   MICROALBUR 10 07/26/2019   [place] 91 07/26/2019   CREATININE 0.91 07/21/2019    Office Visit on 07/26/2019  Component Date Value Ref Range Status  . Color, UA 07/26/2019 yellow   Final  . Clarity, UA 07/26/2019 clear   Final  . Glucose, UA 07/26/2019 Positive* Negative Final  . Bilirubin, UA 07/26/2019 Negative   Final  . Ketones, UA 07/26/2019 Negative   Final  . Spec Grav, UA 07/26/2019 1.010  1.010 - 1.025 Final  . Blood, UA 07/26/2019 Negative   Final  . pH, UA 07/26/2019 5.5  5.0 - 8.0 Final  . Protein, UA 07/26/2019 Negative  Negative Final  . Urobilinogen, UA 07/26/2019 0.2  0.2 or 1.0 E.U./dL Final  . Nitrite, UA 07/26/2019 Negative   Final  . Leukocytes, UA 07/26/2019 Small (1+)* Negative Final  . Microalbumin Ur, POC 07/26/2019 10  mg/L Final  . Creatinine, POC 07/26/2019 50  mg/dL Final  . Albumin/Creatinine Ratio, Urine, P* 07/26/2019 30 - 300 mg/g   Final  . Cholesterol, Total 07/26/2019 190  100 - 199 mg/dL Final  . Triglycerides 07/26/2019 139  0 - 149 mg/dL Final  . HDL 07/26/2019 75  >39 mg/dL Final  . VLDL Cholesterol Cal 07/26/2019 24  5 - 40 mg/dL Final  . LDL Chol Calc (NIH) 07/26/2019 91  0 - 99 mg/dL Final  . Chol/HDL Ratio 07/26/2019 2.5  0.0 - 4.4 ratio Final   Comment:                                   T. Chol/HDL Ratio                                             Men  Women                               1/2 Avg.Risk  3.4    3.3  Avg.Risk  5.0    4.4                                2X Avg.Risk  9.6    7.1                                3X Avg.Risk 23.4   11.0   . Hgb A1c MFr Bld 07/26/2019 8.6* 4.8 - 5.6 % Final   Comment:          Prediabetes: 5.7 - 6.4          Diabetes: >6.4          Glycemic control for adults with diabetes: <7.0   . Est.  average glucose Bld gHb Est-m* 07/26/2019 200  mg/dL Final  . TSH 07/26/2019 0.753  0.450 - 4.500 uIU/mL Final    Allergies as of 07/29/2019      Reactions   Cherry Rash   Lemon Oil Rash      Medication List       Accurate as of July 29, 2019 11:59 PM. If you have any questions, ask your nurse or doctor.        baclofen 10 MG tablet Commonly known as: LIORESAL Take 1 tablet (10 mg total) by mouth 3 (three) times daily as needed for muscle spasms.   FreeStyle Libre 14 Day Reader Kerrin Mo USE READER TO CHECK BLOOD GLUCOSE WITH FREESTYLE LIBRE SENSORS.   FreeStyle Libre 14 Day Sensor Misc APPLY SENSOR TO BODY ONCE EVERY 14 DAYS TO MONITOR BLOOD GLUCOSE   insulin lispro 100 UNIT/ML KwikPen Commonly known as: HumaLOG KwikPen Inject 20 units under the skin three times daily before meals. What changed:   how much to take  how to take this  when to take this  reasons to take this  additional instructions   Insulin Lispro Prot & Lispro (75-25) 100 UNIT/ML Kwikpen Commonly known as: HumaLOG Mix 75/25 KwikPen Start with 10 units before lunch and increase daily by 2 units until bedtime readings are below 180 Started by: Elayne Snare, MD   Insulin Pen Needle 32G X 4 MM Misc Commonly known as: NovoFine Plus Use 3 per day to inject insulin   Klor-Con M20 20 MEQ tablet Generic drug: potassium chloride SA TAKE 1 TABLET DAILY   lidocaine-prilocaine cream Commonly known as: EMLA Apply 1 application topically as directed. Apply 1 hour prior to stick and cover with plastic wrap   lisinopril-hydrochlorothiazide 10-12.5 MG tablet Commonly known as: ZESTORETIC TAKE 1 TABLET DAILY   loratadine 10 MG tablet Commonly known as: CLARITIN Take 10 mg by mouth at bedtime.   magnesium oxide 400 (241.3 Mg) MG tablet Commonly known as: MAG-OX Take 1 tablet (400 mg total) by mouth 2 (two) times daily.   metFORMIN 750 MG 24 hr tablet Commonly known as: GLUCOPHAGE-XR TAKE 3 TABLETS  DAILY   ondansetron 8 MG tablet Commonly known as: ZOFRAN Take 1 tablet (8 mg total) by mouth every 8 (eight) hours as needed for nausea or vomiting. Start 72 hours after chemo treatment   oxyCODONE 5 MG immediate release tablet Commonly known as: Oxy IR/ROXICODONE Take 1-2 tablets (5-10 mg total) by mouth every 4 (four) hours as needed for moderate pain, severe pain or breakthrough pain.   pantoprazole 40 MG tablet Commonly known as: PROTONIX Take 1 tablet (40 mg total)  by mouth daily at 12 noon.   prochlorperazine 10 MG tablet Commonly known as: COMPAZINE Take 1 tablet (10 mg total) by mouth every 6 (six) hours as needed.   traMADol 50 MG tablet Commonly known as: ULTRAM Take 1-2 tablets (50-100 mg total) by mouth every 6 (six) hours as needed for moderate pain or severe pain.   TRESIBA FLEXTOUCH [place] Inject 14-60 Units into the skin at bedtime.       Allergies:  Allergies  Allergen Reactions  . Cherry Rash  . Lemon Oil Rash    Past Medical History:  Diagnosis Date  . Anemia   . ASCUS (atypical squamous cells of undetermined significance) on Pap smear 08/06/1999  . Breast mass in female 2002   Left  . Cancer [facility])    Neuroendocrine Tumor of the pancreas  . Diabetes mellitus without complication ([place])   . Family history of pancreatic cancer   . Fibroid uterus 2010  . Hypertension   . Irregular bleeding 2011  . 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 PONV (postoperative nausea and vomiting)    nausea  vomitting after 12/25/18 ERCP  . 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 Yeast vaginitis 2006    Past Surgical History:  Procedure Laterality Date  . BILIARY STENT PLACEMENT N/A 12/25/2018   Procedure: BILIARY STENT PLACEMENT;  Surgeon: Carol Ada, MD;  Location: [facility];  Service: Endoscopy;  Laterality: N/A;  . BILIARY STENT PLACEMENT N/A 01/01/2019   Procedure: BILIARY STENT PLACEMENT;  Surgeon: Carol Ada, MD;   Location: [facility];  Service: Endoscopy;  Laterality: N/A;  . DILATATION & CURETTAGE/HYSTEROSCOPY WITH TRUECLEAR N/A 01/20/2014   Procedure: DILATATION & CURETTAGE/HYSTEROSCOPY WITH TRUCLEAR;  Surgeon: [name], MD;  Location: [facility];  Service: Gynecology;  Laterality: N/A;  . DILATATION & CURRETTAGE/HYSTEROSCOPY WITH RESECTOCOPE N/A 01/20/2014   Procedure: [place];  Surgeon: [name], MD;  Location: [facility];  Service: Gynecology;  Laterality: N/A;  . ENDOSCOPIC RETROGRADE CHOLANGIOPANCREATOGRAPHY (ERCP) WITH PROPOFOL N/A 12/25/2018   Procedure: ENDOSCOPIC RETROGRADE CHOLANGIOPANCREATOGRAPHY (ERCP) WITH PROPOFOL;  Surgeon: Carol Ada, MD;  Location: [facility];  Service: Endoscopy;  Laterality: N/A;  . ENDOSCOPIC RETROGRADE CHOLANGIOPANCREATOGRAPHY (ERCP) WITH PROPOFOL N/A 01/01/2019   Procedure: ENDOSCOPIC RETROGRADE CHOLANGIOPANCREATOGRAPHY (ERCP) WITH PROPOFOL;  Surgeon: Carol Ada, MD;  Location: [facility];  Service: Endoscopy;  Laterality: N/A;  . ERCP  12/25/2018  . FINE NEEDLE ASPIRATION N/A 01/01/2019   Procedure: FINE NEEDLE ASPIRATION (FNA) LINEAR;  Surgeon: Carol Ada, MD;  Location: [facility];  Service: Endoscopy;  Laterality: N/A;  . LAPAROSCOPY N/A 03/04/2019   Procedure: LAPAROSCOPY DIAGNOSTIC;  Surgeon: Stark Klein, MD;  Location: [facility];  Service: General;  Laterality: N/A;  GENERAL AND EPIDURAL  . NO PAST SURGERIES    . PARTIAL NEPHRECTOMY Right 03/04/2019   Procedure: Nephrectomy Partial;  Surgeon: Cleon Gustin, MD;  Location: [facility];  Service: Urology;  Laterality: Right;  . PORTACATH PLACEMENT Left 04/21/2019   Procedure: INSERTION PORT-A-CATH;  Surgeon: Stark Klein, MD;  Location: [facility];  Service: General;  Laterality: Left;  . SPHINCTEROTOMY  12/25/2018   Procedure: SPHINCTEROTOMY;  Surgeon: Carol Ada, MD;  Location: [facility];  Service: Endoscopy;;  . Lavell Islam REMOVAL  01/01/2019    Procedure: STENT REMOVAL;  Surgeon: Carol Ada, MD;  Location: [facility];  Service: Endoscopy;;  . UPPER ESOPHAGEAL ENDOSCOPIC ULTRASOUND (EUS) N/A 01/01/2019   Procedure: UPPER ESOPHAGEAL ENDOSCOPIC ULTRASOUND (EUS);  Surgeon: Carol Ada,  MD;  Location: [facility];  Service: Endoscopy;  Laterality: N/A;  . WHIPPLE PROCEDURE N/A 03/04/2019   Procedure: WHIPPLE PROCEDURE;  Surgeon: Stark Klein, MD;  Location: [facility];  Service: General;  Laterality: N/A;  GENERAL AND EPIDURAL    Family History  Problem Relation Age of Onset  . Diabetes Mother   . Dementia Mother   . Hyperlipidemia Mother   . Hypertension Mother   . Irregular heart beat Mother   . Diabetes Father   . Hypertension Father   . Hyperlipidemia Father   . Down syndrome Sister   . Diabetes Sister   . Hyperlipidemia Brother   . Heart Problems Brother   . Goiter Maternal Aunt   . Thyroid nodules Sister   . Cancer Cousin 60       eye; maternal first cousin  . Goiter Cousin   . Cancer Paternal Aunt        unknown form of cancer  . Cancer Cousin        unknown form of cancer; paternal first cousin  . Pancreatic cancer Cousin 46       paternal first cousin  . Cancer Cousin 81       unknown cancer; paternal first cousin  . Cancer Cousin 67       unknown cancer; paternal first cousin    Social History:  reports that she has never smoked. She has never used smokeless tobacco. She reports current alcohol use. She reports that she does not use drugs.  REVIEW of systems:   She is being followed by her PCP for hypertension with lisinopril HCT 10/12 .5 mg daily     BP Readings from Last 3 Encounters:  07/26/19 (!) 122/94  07/23/19 132/72  07/21/19 132/70     Has had hypercholesterolemia, was taking Crestor 20 mg but this was stopped by PCP, also has history of mild increase in triglycerides which are now normal Lipids appear to be better with her weight loss  Labs as follows  Lab Results  Component  Value Date   CHOL 190 07/26/2019   HDL 75 07/26/2019   LDLCALC 91 07/26/2019   LDLDIRECT 97.7 02/11/2014   TRIG 139 07/26/2019   CHOLHDL 2.5 07/26/2019      Examination:   LMP 09/17/2012   There is no height or weight on file to calculate BMI.    Assessment/Plan:   Diabetes type 2, uncontrolled  See history of present illness for detailed discussion of her current management, blood sugar patterns and problems identified  Her A1c is usually over 8% and now 8.6  Her blood sugars are persistently out-of-control especially postprandial  With taking small doses of basal insulin and Humalog only once a day her blood sugars are out of control Average blood sugar is ranging from 172 up to 283 at any given intervals on her CGM analysis Blood sugars go up significantly and stay up after lunch and only come down gradually through the night She thinks the diet can be better Exercise is also inadequate  Blood sugar patterns indicate that she does need peak of insulin in the afternoon and early evening which she will not be able to get with small doses of Humalog She is again refusing to increase Humalog because of fear of weight gain Explained to the patient that anytime blood sugars are improved with treatment there will be a tendency to weight gain  This can only be offset by SGLT2 drugs which she refuses to take  Recommendations:  Trial of Humalog mix insulin instead of Humalog for better control of her hyperglycemia in the afternoons and evenings  Discussed in detail the differences between this and Humalog insulin, course of action, timing and blood sugar targets  She can increase the dose by 2 units every 3 days until bedtime readings are under 180  Continue Metformin but change to 2000 mg using the 500 mg tablets daily since her insurance refuses to fill the full dose of the 750 mg tablets  Start walking regularly for exercise  History of hyperlipidemia: Looks like her  lipids are excellent even without medications and likely from her weight loss, continue to monitor  Hypertension: Followed by PCP with mostly good readings   Follow-up in 6 weeks  There are no Patient Instructions on file for this visit.      Elayne Snare 07/30/2019, 2:20 PM   Note: This office note was prepared with Dragon voice recognition system technology. Any transcriptional errors that result from this process are unintentional.

## 2019-07-30 ENCOUNTER — Telehealth: Payer: Self-pay

## 2019-07-30 ENCOUNTER — Other Ambulatory Visit: Payer: Self-pay

## 2019-07-30 MED ORDER — NOVOLOG MIX 70/30 FLEXPEN (70-30) 100 UNIT/ML ~~LOC~~ SUPN: PEN_INJECTOR | SUBCUTANEOUS | 1 refills | Status: DC

## 2019-07-30 NOTE — Telephone Encounter (Signed)
When pt was called to schedule f/u visit from yesterday appt, she stated that Humalog 75/25 is not covered by her insurance. Do you want to do PA or change to Novolog 70/30?

## 2019-07-30 NOTE — Telephone Encounter (Signed)
Change to NovoLog 70/30 but she will start with 12 units at lunch instead of 10

## 2019-07-30 NOTE — Telephone Encounter (Signed)
Rx sent and pt notified via [place].

## 2019-07-31 ENCOUNTER — Other Ambulatory Visit: Payer: Self-pay | Admitting: Oncology

## 2019-08-03 ENCOUNTER — Inpatient Hospital Stay: Payer: BC Managed Care – PPO

## 2019-08-03 ENCOUNTER — Other Ambulatory Visit: Payer: Self-pay

## 2019-08-03 ENCOUNTER — Inpatient Hospital Stay: Payer: BC Managed Care – PPO | Admitting: Oncology

## 2019-08-03 VITALS — BP 129/88 | HR 99 | Temp 98.5°F | Resp 18 | Ht 67.8 in | Wt 196.8 lb

## 2019-08-03 DIAGNOSIS — C187 Malignant neoplasm of sigmoid colon: Secondary | ICD-10-CM | POA: Diagnosis not present

## 2019-08-03 DIAGNOSIS — C250 Malignant neoplasm of head of pancreas: Secondary | ICD-10-CM

## 2019-08-03 DIAGNOSIS — Z905 Acquired absence of kidney: Secondary | ICD-10-CM | POA: Diagnosis not present

## 2019-08-03 DIAGNOSIS — C7A8 Other malignant neuroendocrine tumors: Secondary | ICD-10-CM | POA: Diagnosis not present

## 2019-08-03 DIAGNOSIS — I10 Essential (primary) hypertension: Secondary | ICD-10-CM | POA: Diagnosis not present

## 2019-08-03 DIAGNOSIS — Z5111 Encounter for antineoplastic chemotherapy: Secondary | ICD-10-CM | POA: Diagnosis not present

## 2019-08-03 DIAGNOSIS — E119 Type 2 diabetes mellitus without complications: Secondary | ICD-10-CM | POA: Diagnosis not present

## 2019-08-03 DIAGNOSIS — Z85528 Personal history of other malignant neoplasm of kidney: Secondary | ICD-10-CM | POA: Diagnosis not present

## 2019-08-03 LAB — CMP (CANCER CENTER ONLY)
ALT: 60 U/L — ABNORMAL HIGH (ref 0–44)
AST: 44 U/L — ABNORMAL HIGH (ref 15–41)
Albumin: 3.7 g/dL (ref 3.5–5.0)
Alkaline Phosphatase: 207 U/L — ABNORMAL HIGH (ref 38–126)
Anion gap: 10 (ref 5–15)
BUN: 12 mg/dL (ref 6–20)
CO2: 26 mmol/L (ref 22–32)
Calcium: 9.2 mg/dL (ref 8.9–10.3)
Chloride: 107 mmol/L (ref 98–111)
Creatinine: 0.85 mg/dL (ref 0.44–1.00)
GFR, Est AFR Am: >60 mL/min (ref >60)
GFR, Estimated: >60 mL/min (ref >60)
Glucose, Bld: 180 mg/dL — ABNORMAL HIGH (ref 70–99)
Potassium: 3.5 mmol/L (ref 3.5–5.1)
Sodium: 143 mmol/L (ref 135–145)
Total Bilirubin: 0.3 mg/dL (ref 0.3–1.2)
Total Protein: 7.2 g/dL (ref 6.5–8.1)

## 2019-08-03 LAB — CBC WITH DIFFERENTIAL (CANCER CENTER ONLY)
Abs Immature Granulocytes: 0.01 10*3/uL (ref 0.00–0.07)
Basophils Absolute: 0 10*3/uL (ref 0.0–0.1)
Basophils Relative: 0 %
Eosinophils Absolute: 0 10*3/uL (ref 0.0–0.5)
Eosinophils Relative: 1 %
HCT: 30.8 % — ABNORMAL LOW (ref 36.0–46.0)
Hemoglobin: 9.8 g/dL — ABNORMAL LOW (ref 12.0–15.0)
Immature Granulocytes: 0 %
Lymphocytes Relative: 46 %
Lymphs Abs: 2.1 10*3/uL (ref 0.7–4.0)
MCH: 30 pg (ref 26.0–34.0)
MCHC: 31.8 g/dL (ref 30.0–36.0)
MCV: 94.2 fL (ref 80.0–100.0)
Monocytes Absolute: 0.4 10*3/uL (ref 0.1–1.0)
Monocytes Relative: 9 %
Neutro Abs: 2.1 10*3/uL (ref 1.7–7.7)
Neutrophils Relative %: 44 %
Platelet Count: 311 10*3/uL (ref 150–400)
RBC: 3.27 MIL/uL — ABNORMAL LOW (ref 3.87–5.11)
RDW: 14.7 % (ref 11.5–15.5)
WBC Count: 4.7 10*3/uL (ref 4.0–10.5)
nRBC: 0 % (ref 0.0–0.2)

## 2019-08-03 LAB — MAGNESIUM: Magnesium: 1.5 mg/dL — ABNORMAL LOW (ref 1.7–2.4)

## 2019-08-03 MED ORDER — SODIUM CHLORIDE 0.9 % IV SOLN
2000.0000 mg/m2 | INTRAVENOUS | Status: DC
  Administered 2019-08-03: 13:00:00 4100 mg via INTRAVENOUS
  Filled 2019-08-03: qty 82

## 2019-08-03 MED ORDER — MAGNESIUM SULFATE 2 GM/50ML IV SOLN
2.0000 g | Freq: Once | INTRAVENOUS | Status: AC
  Administered 2019-08-03: 2 g via INTRAVENOUS

## 2019-08-03 MED ORDER — ATROPINE SULFATE 1 MG/ML IJ SOLN
0.5000 mg | Freq: Once | INTRAMUSCULAR | Status: AC | PRN
  Administered 2019-08-03: 0.5 mg via INTRAVENOUS

## 2019-08-03 MED ORDER — PALONOSETRON HCL INJECTION 0.25 MG/5ML
0.2500 mg | Freq: Once | INTRAVENOUS | Status: AC
  Administered 2019-08-03: 0.25 mg via INTRAVENOUS

## 2019-08-03 MED ORDER — MAGNESIUM SULFATE 2 GM/50ML IV SOLN
INTRAVENOUS | Status: AC
  Filled 2019-08-03: qty 50

## 2019-08-03 MED ORDER — SODIUM CHLORIDE 0.9 % IV SOLN
400.0000 mg/m2 | Freq: Once | INTRAVENOUS | Status: AC
  Administered 2019-08-03: 824 mg via INTRAVENOUS
  Filled 2019-08-03: qty 41.2

## 2019-08-03 MED ORDER — SODIUM CHLORIDE 0.9 % IV SOLN
150.0000 mg/m2 | Freq: Once | INTRAVENOUS | Status: AC
  Administered 2019-08-03: 12:00:00 300 mg via INTRAVENOUS
  Filled 2019-08-03: qty 15

## 2019-08-03 MED ORDER — ATROPINE SULFATE 1 MG/ML IJ SOLN
INTRAMUSCULAR | Status: AC
  Filled 2019-08-03: qty 1

## 2019-08-03 MED ORDER — SODIUM CHLORIDE 0.9 % IV SOLN
Freq: Once | INTRAVENOUS | Status: AC
  Filled 2019-08-03: qty 5

## 2019-08-03 MED ORDER — SODIUM CHLORIDE 0.9 % IV SOLN
INTRAVENOUS | Status: DC
  Filled 2019-08-03: qty 250

## 2019-08-03 MED ORDER — LEUCOVORIN CALCIUM INJECTION 350 MG: 400.0000 mg/m2 | Freq: Once | INTRAVENOUS | Status: DC

## 2019-08-03 NOTE — Patient Instructions (Signed)

## 2019-08-03 NOTE — Progress Notes (Signed)
Per Dr. Benay Spice: OK to treat today with glucose 180 (diabetic). Will hold oxaliplatin due to neuropathy.

## 2019-08-03 NOTE — Progress Notes (Signed)
**Alyssa Ross De-Identified via Obfuscation** Alyssa Alyssa Ross   Diagnosis: Pancreas cancer  INTERVAL HISTORY:   Alyssa Alyssa Ross returns as scheduled.  She completed another cycle of FOLFIRINOX on 07/21/2019.  She had a sore at the right nasal septum, this has improved.  No nausea.  She reports 1 day of diarrhea.  She had cold sensitivity lasting 4 to 5 days following chemotherapy.  She continues to have tingling in the toes.  This has increased.  Objective:  Vital signs in last 24 hours:  Blood pressure 129/88, pulse 99, temperature 98.5 F (36.9 C), temperature source Temporal, resp. rate 18, height 5' 7.8" (1.722 m), weight 196 lb 12.8 oz (89.3 kg), last menstrual period 09/17/2012, SpO2 100 %.    HEENT: Erythema at the right side of the nasal septum without a discrete ulcer. Cardio: Regular rate and rhythm GI: No hepatosplenomegaly, nontender, no mass Vascular: No leg edema Neuro: Moderate loss of vibratory sense at the fingertips bilaterally, she can discriminate rough and smooth textures    Portacath/PICC-without erythema  Lab Results:  Lab Results  Component Value Date   WBC 4.7 08/03/2019   HGB 9.8 (L) 08/03/2019   HCT 30.8 (L) 08/03/2019   MCV 94.2 08/03/2019   PLT 311 08/03/2019   NEUTROABS 2.1 08/03/2019    CMP  Lab Results  Component Value Date   NA 143 08/03/2019   K 3.5 08/03/2019   CL 107 08/03/2019   CO2 26 08/03/2019   GLUCOSE 180 (H) 08/03/2019   BUN 12 08/03/2019   CREATININE 0.85 08/03/2019   CALCIUM 9.2 08/03/2019   PROT 7.2 08/03/2019   ALBUMIN 3.7 08/03/2019   AST 44 (H) 08/03/2019   ALT 60 (H) 08/03/2019   ALKPHOS 207 (H) 08/03/2019   BILITOT 0.3 08/03/2019   GFRNONAA >60 08/03/2019   GFRAA >60 08/03/2019    Medications: I have reviewed the patient's current medications.   Assessment/Plan:  1. 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 Tumor invades the duodenal wall and vascular groove, resection margins  negative, 4/34 lymph nodes positive ? MSI-stable, tumor showed instability in 2 loci as did adjacent normal tissue ? 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 CTs 01/29/2019-ill-defined pancreas head mass, 5 pulmonary nodules-1 with a small amount of central cavitation, tumor abuts the left margin of the portal vein indistinct density surrounding, hepatic artery, complex cystic lesion of the right kidney, right adrenal mass-characterized as an adenoma on a [facility] MRI 12/21/2018 ? 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 Elevated preoperative CA 19-9 ? CT chest 04/16/2019-multiple bilateral pulmonary nodules, some with increased cavitation, stable in size ? Cycle 1 FOLFIRINOX 04/27/2019 ? Cycle 2 FOLFIRINOX 05/11/2019 ? Cycle 3 FOLFIRINOX 05/23/2019 ? Cycle 4 FOLFIRINOX 06/08/2019 ? Cycle 5 FOLFIRINOX 06/22/2019 ? CT chest 07/02/2019-stable size of bilateral pulmonary nodules.  Dominant cavitary lesions in both lungs show increased cavitation with thinner walls.  Stable 2.1 cm right adrenal nodule. ? Cycle 6 FOLFIRINOX 07/06/2019 ? Cycle 7 FOLFIRINOX 07/21/2019 ? Cycle 8 FOLFIRINOX 08/03/2019, oxaliplatin deleted secondary to neuropathy  2. 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 Diabetes 4. Hypertension 5. Family history of pancreas cancer, INVITAE panel-VUS in the TERT 6. Port-A-Cath placement, Dr. 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 has completed 7 cycles of FOLFIRINOX.  She continues to tolerate the chemotherapy well.  She will complete cycle 8 chemotherapy today.  Oxaliplatin will be held secondary to progressive neuropathy symptoms.  She will undergo restaging chest CT after this cycle.  We will then decide on the indication for  "adjuvant "radiation.  [name] will return for an office visit after the restaging chest CT.  [name], MD  08/03/2019  10:20 AM

## 2019-08-03 NOTE — Patient Instructions (Signed)
[facility] Discharge Instructions for Patients Receiving Chemotherapy  Today you received the following chemotherapy agents: leucovorin, Irinotecan, 5FU  To help prevent nausea and vomiting after your treatment, we encourage you to take your nausea medication as directed.   If you develop nausea and vomiting that is not controlled by your nausea medication, call the clinic.   BELOW ARE SYMPTOMS THAT SHOULD BE REPORTED IMMEDIATELY:  *FEVER GREATER THAN 100.5 F  *CHILLS WITH OR WITHOUT FEVER  NAUSEA AND VOMITING THAT IS NOT CONTROLLED WITH YOUR NAUSEA MEDICATION  *UNUSUAL SHORTNESS OF BREATH  *UNUSUAL BRUISING OR BLEEDING  TENDERNESS IN MOUTH AND THROAT WITH OR WITHOUT PRESENCE OF ULCERS  *URINARY PROBLEMS  *BOWEL PROBLEMS  UNUSUAL RASH Items with * indicate a potential emergency and should be followed up as soon as possible.  Feel free to call the clinic should you have any questions or concerns. The clinic phone number is [phone].  Please show the [facility] at check-in to the Emergency Department and triage nurse.

## 2019-08-04 ENCOUNTER — Other Ambulatory Visit: Payer: Self-pay | Admitting: Endocrinology

## 2019-08-04 ENCOUNTER — Telehealth: Payer: Self-pay | Admitting: Oncology

## 2019-08-04 NOTE — Telephone Encounter (Signed)
Scheduled per los. Called and spoke with patient. Confirmed appt 

## 2019-08-05 ENCOUNTER — Inpatient Hospital Stay: Payer: BC Managed Care – PPO

## 2019-08-05 ENCOUNTER — Other Ambulatory Visit: Payer: Self-pay

## 2019-08-05 VITALS — BP 129/88 | HR 80 | Temp 98.2°F | Resp 18

## 2019-08-05 DIAGNOSIS — C7A8 Other malignant neuroendocrine tumors: Secondary | ICD-10-CM | POA: Diagnosis not present

## 2019-08-05 DIAGNOSIS — C250 Malignant neoplasm of head of pancreas: Secondary | ICD-10-CM | POA: Diagnosis not present

## 2019-08-05 DIAGNOSIS — Z905 Acquired absence of kidney: Secondary | ICD-10-CM | POA: Diagnosis not present

## 2019-08-05 DIAGNOSIS — E119 Type 2 diabetes mellitus without complications: Secondary | ICD-10-CM | POA: Diagnosis not present

## 2019-08-05 DIAGNOSIS — Z85528 Personal history of other malignant neoplasm of kidney: Secondary | ICD-10-CM | POA: Diagnosis not present

## 2019-08-05 DIAGNOSIS — I10 Essential (primary) hypertension: Secondary | ICD-10-CM | POA: Diagnosis not present

## 2019-08-05 DIAGNOSIS — Z5111 Encounter for antineoplastic chemotherapy: Secondary | ICD-10-CM | POA: Diagnosis not present

## 2019-08-05 MED ORDER — SODIUM CHLORIDE 0.9% FLUSH
10.0000 mL | INTRAVENOUS | Status: DC | PRN
  Administered 2019-08-05: 10 mL
  Filled 2019-08-05: qty 10

## 2019-08-05 MED ORDER — HEPARIN SOD (PORK) LOCK FLUSH 100 UNIT/ML IV SOLN
500.0000 [IU] | Freq: Once | INTRAVENOUS | Status: AC | PRN
  Administered 2019-08-05: 500 [IU]
  Filled 2019-08-05: qty 5

## 2019-08-05 NOTE — Patient Instructions (Signed)

## 2019-08-17 ENCOUNTER — Other Ambulatory Visit: Payer: BC Managed Care – PPO

## 2019-08-17 ENCOUNTER — Ambulatory Visit: Payer: BC Managed Care – PPO | Admitting: Oncology

## 2019-08-17 ENCOUNTER — Ambulatory Visit: Payer: BC Managed Care – PPO

## 2019-08-18 ENCOUNTER — Other Ambulatory Visit: Payer: Self-pay

## 2019-08-18 MED ORDER — METFORMIN HCL ER 500 MG PO TB24: ORAL_TABLET | ORAL | 1 refills | Status: DC

## 2019-08-24 ENCOUNTER — Ambulatory Visit (HOSPITAL_COMMUNITY)
Admission: RE | Admit: 2019-08-24 | Discharge: 2019-08-24 | Disposition: A | Discharge status: Home / Self Care | Payer: BC Managed Care – PPO | Source: Ambulatory Visit | Attending: Oncology | Admitting: Oncology

## 2019-08-24 ENCOUNTER — Encounter (HOSPITAL_COMMUNITY): Payer: Self-pay

## 2019-08-24 ENCOUNTER — Other Ambulatory Visit: Payer: Self-pay

## 2019-08-24 DIAGNOSIS — C250 Malignant neoplasm of head of pancreas: Secondary | ICD-10-CM | POA: Insufficient documentation

## 2019-08-24 DIAGNOSIS — C259 Malignant neoplasm of pancreas, unspecified: Secondary | ICD-10-CM | POA: Diagnosis not present

## 2019-08-24 DIAGNOSIS — R918 Other nonspecific abnormal finding of lung field: Secondary | ICD-10-CM | POA: Diagnosis not present

## 2019-08-25 ENCOUNTER — Telehealth: Payer: Self-pay | Admitting: Adult Health

## 2019-08-25 ENCOUNTER — Inpatient Hospital Stay: Payer: BC Managed Care – PPO

## 2019-08-25 ENCOUNTER — Inpatient Hospital Stay: Payer: BC Managed Care – PPO | Attending: Genetic Counselor | Admitting: Oncology

## 2019-08-25 ENCOUNTER — Telehealth: Payer: Self-pay | Admitting: Oncology

## 2019-08-25 ENCOUNTER — Other Ambulatory Visit: Payer: Self-pay

## 2019-08-25 VITALS — BP 121/86 | HR 80 | Temp 98.0°F | Resp 18 | Ht 67.8 in | Wt 202.8 lb

## 2019-08-25 DIAGNOSIS — E279 Disorder of adrenal gland, unspecified: Secondary | ICD-10-CM | POA: Insufficient documentation

## 2019-08-25 DIAGNOSIS — C250 Malignant neoplasm of head of pancreas: Secondary | ICD-10-CM

## 2019-08-25 DIAGNOSIS — I700 Atherosclerosis of aorta: Secondary | ICD-10-CM | POA: Diagnosis not present

## 2019-08-25 DIAGNOSIS — Z8042 Family history of malignant neoplasm of prostate: Secondary | ICD-10-CM | POA: Insufficient documentation

## 2019-08-25 DIAGNOSIS — Z85528 Personal history of other malignant neoplasm of kidney: Secondary | ICD-10-CM | POA: Insufficient documentation

## 2019-08-25 DIAGNOSIS — I10 Essential (primary) hypertension: Secondary | ICD-10-CM | POA: Insufficient documentation

## 2019-08-25 DIAGNOSIS — E1140 Type 2 diabetes mellitus with diabetic neuropathy, unspecified: Secondary | ICD-10-CM | POA: Insufficient documentation

## 2019-08-25 DIAGNOSIS — Z905 Acquired absence of kidney: Secondary | ICD-10-CM | POA: Diagnosis not present

## 2019-08-25 DIAGNOSIS — Z95828 Presence of other vascular implants and grafts: Secondary | ICD-10-CM

## 2019-08-25 DIAGNOSIS — R918 Other nonspecific abnormal finding of lung field: Secondary | ICD-10-CM | POA: Insufficient documentation

## 2019-08-25 DIAGNOSIS — Z79899 Other long term (current) drug therapy: Secondary | ICD-10-CM | POA: Insufficient documentation

## 2019-08-25 LAB — CMP (CANCER CENTER ONLY)
ALT: 42 U/L (ref 0–44)
AST: 41 U/L (ref 15–41)
Albumin: 3.7 g/dL (ref 3.5–5.0)
Alkaline Phosphatase: 188 U/L — ABNORMAL HIGH (ref 38–126)
Anion gap: 9 (ref 5–15)
BUN: 16 mg/dL (ref 6–20)
CO2: 26 mmol/L (ref 22–32)
Calcium: 9.2 mg/dL (ref 8.9–10.3)
Chloride: 106 mmol/L (ref 98–111)
Creatinine: 0.88 mg/dL (ref 0.44–1.00)
GFR, Est AFR Am: >60 mL/min (ref >60)
GFR, Estimated: >60 mL/min (ref >60)
Glucose, Bld: 201 mg/dL — ABNORMAL HIGH (ref 70–99)
Potassium: 3.8 mmol/L (ref 3.5–5.1)
Sodium: 141 mmol/L (ref 135–145)
Total Bilirubin: 0.4 mg/dL (ref 0.3–1.2)
Total Protein: 7 g/dL (ref 6.5–8.1)

## 2019-08-25 LAB — CBC WITH DIFFERENTIAL (CANCER CENTER ONLY)
Abs Immature Granulocytes: 0 10*3/uL (ref 0.00–0.07)
Basophils Absolute: 0 10*3/uL (ref 0.0–0.1)
Basophils Relative: 1 %
Eosinophils Absolute: 0.1 10*3/uL (ref 0.0–0.5)
Eosinophils Relative: 2 %
HCT: 32.8 % — ABNORMAL LOW (ref 36.0–46.0)
Hemoglobin: 10.2 g/dL — ABNORMAL LOW (ref 12.0–15.0)
Immature Granulocytes: 0 %
Lymphocytes Relative: 52 %
Lymphs Abs: 2.1 10*3/uL (ref 0.7–4.0)
MCH: 29.7 pg (ref 26.0–34.0)
MCHC: 31.1 g/dL (ref 30.0–36.0)
MCV: 95.3 fL (ref 80.0–100.0)
Monocytes Absolute: 0.5 10*3/uL (ref 0.1–1.0)
Monocytes Relative: 12 %
Neutro Abs: 1.3 10*3/uL — ABNORMAL LOW (ref 1.7–7.7)
Neutrophils Relative %: 33 %
Platelet Count: 367 10*3/uL (ref 150–400)
RBC: 3.44 MIL/uL — ABNORMAL LOW (ref 3.87–5.11)
RDW: 13.9 % (ref 11.5–15.5)
WBC Count: 4 10*3/uL (ref 4.0–10.5)
nRBC: 0 % (ref 0.0–0.2)

## 2019-08-25 LAB — MAGNESIUM: Magnesium: 1.5 mg/dL — ABNORMAL LOW (ref 1.7–2.4)

## 2019-08-25 MED ORDER — SODIUM CHLORIDE 0.9% FLUSH
10.0000 mL | Freq: Once | INTRAVENOUS | Status: AC
  Administered 2019-08-25: 10 mL
  Filled 2019-08-25: qty 10

## 2019-08-25 MED ORDER — HEPARIN SOD (PORK) LOCK FLUSH 100 UNIT/ML IV SOLN
500.0000 [IU] | Freq: Once | INTRAVENOUS | Status: AC
  Administered 2019-08-25: 500 [IU]
  Filled 2019-08-25: qty 5

## 2019-08-25 NOTE — Telephone Encounter (Signed)
Scheduled appts per 3/17 los. Gave pt a print out of AVS.

## 2019-08-25 NOTE — Progress Notes (Signed)
[place] OFFICE PROGRESS NOTE   Diagnosis: Pancreas cancer  INTERVAL HISTORY:   Alyssa Ross returns as scheduled.  No new complaint.  She continues to have peripheral numbness, worse in the feet.  She is scheduled for a radiation consult next week.  Objective:  Vital signs in last 24 hours:  Blood pressure 121/86, pulse 80, temperature 98 F (36.7 C), resp. rate 18, height 5' 7.8" (1.722 m), weight 202 lb 12.8 oz (92 kg), last menstrual period 09/17/2012.    Physical examination-not performed today  Lab Results:  Lab Results  Component Value Date   WBC 4.0 08/25/2019   HGB 10.2 (L) 08/25/2019   HCT 32.8 (L) 08/25/2019   MCV 95.3 08/25/2019   PLT 367 08/25/2019   NEUTROABS 1.3 (L) 08/25/2019    CMP  Lab Results  Component Value Date   NA 143 08/03/2019   K 3.5 08/03/2019   CL 107 08/03/2019   CO2 26 08/03/2019   GLUCOSE 180 (H) 08/03/2019   BUN 12 08/03/2019   CREATININE 0.85 08/03/2019   CALCIUM 9.2 08/03/2019   PROT 7.2 08/03/2019   ALBUMIN 3.7 08/03/2019   AST 44 (H) 08/03/2019   ALT 60 (H) 08/03/2019   ALKPHOS 207 (H) 08/03/2019   BILITOT 0.3 08/03/2019   GFRNONAA >60 08/03/2019   GFRAA >60 08/03/2019     Imaging:  CT CHEST WO CONTRAST  Result Date: 08/24/2019 CLINICAL DATA:  Follow-up pulmonary nodules. Pancreatic carcinoma. EXAM: CT CHEST WITHOUT CONTRAST TECHNIQUE: Multidetector CT imaging of the chest was performed following the standard protocol without IV contrast. COMPARISON:  07/02/2019 FINDINGS: Cardiovascular: No acute findings. Aortic atherosclerosis incidentally noted. Mediastinum/Nodes: No masses or pathologically enlarged lymph nodes identified on this unenhanced exam. Lungs/Pleura: 7 mm thin walled cavitary nodule in the superior left lower lobe on image 60/7 remains stable. A similar 5 mm cavitary nodule in the right middle lobe on image 65/7 is decreased in size from 7 mm previously. 5 mm cavitary nodule in the anterior  left lung apex on image 19/7 has decreased in size from 8 mm on prior exam. Another nodule previously seen in the posterior left upper lobe has resolved. No new or enlarging pulmonary nodules or masses identified. Upper Abdomen: Stable postop changes from Whipple procedure again noted. Stable 2.1 cm right adrenal mass consistent with benign adenoma. Musculoskeletal:  No suspicious bone lesions. IMPRESSION: 1. Interval decrease in size of several small bilateral pulmonary nodules. 2. No new or progressive disease within the thorax. Aortic Atherosclerosis (ICD10-I70.0). Electronically Signed   By: Marlaine Hind M.D.   On: 08/24/2019 10:44   Images reviewed. Medications: I have reviewed the patient's current medications.   Assessment/Plan: 1. 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 Tumor invades the duodenal wall and vascular groove, resection margins negative, 4/34 lymph nodes positive ? MSI-stable, tumor showed instability in 2 loci as did adjacent normal tissue ? 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 CTs 01/29/2019-ill-defined pancreas head mass, 5 pulmonary nodules-1 with a small amount of central cavitation, tumor abuts the left margin of the portal vein indistinct density surrounding, hepatic artery, complex cystic lesion of the right kidney, right adrenal mass-characterized as an adenoma on a [facility] MRI 12/21/2018 ? 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 Elevated preoperative CA 19-9 ? CT chest 04/16/2019-multiple bilateral pulmonary nodules, some with  increased cavitation, stable in size ? Cycle 1 FOLFIRINOX 04/27/2019 ? Cycle 2 FOLFIRINOX 05/11/2019 ? Cycle 3 FOLFIRINOX 05/23/2019 ? Cycle 4 FOLFIRINOX 06/08/2019 ? Cycle 5 FOLFIRINOX 06/22/2019 ? CT chest  07/02/2019-stable size of bilateral pulmonary nodules.  Dominant cavitary lesions in both lungs show increased cavitation with thinner walls.  Stable 2.1 cm right adrenal nodule. ? Cycle 6 FOLFIRINOX 07/06/2019 ? Cycle 7 FOLFIRINOX 07/21/2019 ? Cycle 8 FOLFIRINOX 08/03/2019, oxaliplatin deleted secondary to neuropathy ? CT chest 08/24/2019-, decreased size of several lung nodules with resolution of a left upper lobe nodule, no new nodules  2. 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 Diabetes 4. Hypertension 5. Family history of pancreas cancer, INVITAE panel-VUS in the TERT 6. Port-A-Cath placement, Dr. 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  She appears well.  The restaging CT reveals improvement in the lung nodules and no new nodules.  Her case was presented at the GI tumor conference this morning.  The consensus opinion is that she most likely has metastatic disease involving the lungs.  I discussed the findings from the GI tumor conference with Alyssa Ross.  She understands if the lung nodules are metastases  no therapy will be curative.  We discussed the indication for observation, continuing systemic therapy, and "adjuvant "radiation.  The plan was to proceed with adjuvant radiation if the lung lesions are not metastases.  Radiation could improve local control and allow for a longer systemic treatment free interval even if the lung nodules represent metastases.  We can also consider SBRT to the chest if there are a limited number of lung nodules on a restaging evaluation.  Alyssa Ross will see Dr. Lisbeth Renshaw as scheduled next week.  She would like to seek a second opinion.  I agree with this.  We will make a referral to the medical oncology service at [facility] to get their opinion regarding the indication for radiation.  Alyssa Ross will return for an office visit in 3 weeks.  Alyssa Coder, MD  08/25/2019   10:08 AM

## 2019-08-26 LAB — CANCER ANTIGEN 19-9: CA 19-9: 13 U/mL (ref 0–35)

## 2019-08-30 ENCOUNTER — Telehealth: Payer: Self-pay

## 2019-08-30 ENCOUNTER — Other Ambulatory Visit: Payer: Self-pay

## 2019-08-30 ENCOUNTER — Encounter: Payer: Self-pay | Admitting: Radiation Oncology

## 2019-08-30 NOTE — Telephone Encounter (Signed)
Spoke to patient to remind her of telephone encounter on 08/31/19 time changed to 8:30 am due to patient work schedule. She verbalized she understood the time change

## 2019-08-31 ENCOUNTER — Ambulatory Visit
Admission: RE | Admit: 2019-08-31 | Discharge: 2019-08-31 | Disposition: A | Discharge status: Home / Self Care | Payer: BC Managed Care – PPO | Source: Ambulatory Visit | Attending: Radiation Oncology | Admitting: Radiation Oncology

## 2019-08-31 ENCOUNTER — Other Ambulatory Visit: Payer: Self-pay

## 2019-08-31 ENCOUNTER — Ambulatory Visit: Payer: BC Managed Care – PPO | Admitting: Radiation Oncology

## 2019-08-31 ENCOUNTER — Encounter: Payer: Self-pay | Admitting: Radiation Oncology

## 2019-08-31 DIAGNOSIS — C250 Malignant neoplasm of head of pancreas: Secondary | ICD-10-CM | POA: Diagnosis not present

## 2019-08-31 DIAGNOSIS — R918 Other nonspecific abnormal finding of lung field: Secondary | ICD-10-CM | POA: Diagnosis not present

## 2019-08-31 NOTE — Progress Notes (Signed)
Radiation Oncology         [phone] ________________________________  Initial Outpatient Consultation - Conducted via telephone due to current COVID-19 concerns for limiting patient exposure  I spoke with the patient to conduct this consult visit via telephone to spare the patient unnecessary potential exposure in the healthcare setting during the current COVID-19 pandemic. The patient was notified in advance and was offered a [place] meeting to allow for face to face communication but unfortunately reported that they did not have the appropriate resources/technology to support such a visit and instead preferred to proceed with a telephone consult.   ________________________________  Name: Alyssa Ross        MRN: [id number]  Date of Service: 08/31/2019 DOB: 02/15/1966  VE:LFYBOFB, Bailey Mech, MD  Glendale Chard, MD     REFERRING PHYSICIAN: Glendale Chard, MD   DIAGNOSIS: The encounter diagnosis was Malignant neoplasm of head of pancreas ([place]).   HISTORY OF PRESENT ILLNESS: Alyssa Ross is a 54 y.o. female seen at the request of Dr. Barry Dienes for a newly diagnosed pancreatic adenocarcinoma. The patient had been seen by her PCP in June 2020 due to fatigue. She was diagnosed with a UTI but shortly thereafter she developed jaundice and generalized pruritis. She was found to have an abdominal ultrasound on 12/10/2018 that revealed intra and extrahepatic ductal dilatation as well as pancreatic ductal dilatation. She proceeded with MRI of the abdomen on 12/18/2018 revealing a 2.5 x 1.8 cm mass in the head of the pancreas with pancreatic ductal dilitation. She also underwent ERCP with placement of a stent in the distal common bile duct. She was initially thought to have a neuroendocrine tumor, and a PET with dotatate protocol on 03/03/2019 revealed no focal uptake within the pancreas evidence of disease outside of the pancreas, and a pulmonary nodule that had been seen on previous CT did not show activity.  She  was taken to the operating room on 03/04/2019, a Whipple procedure was performed,final pathology revealed a 5.5 cm ductal adenocarcinoma of the pancreas involving the duodenum and superior mesenteric groove no this was not technically invading the SMA but was felt to be very focally close to the margin.  She had 34 sampled lymph nodes for contained metastatic disease. She received adjuvant chemotherapy between 04/27/2019-08/03/19 with FOLFIRINOX. She did have multiple bilateral pulmonary nodules that were stable prior to treatment, and some increased cavitation with thinner walls in the dominant lesions. Her most recent CT of the chest on 08/24/19 revealed to following: 7 mm thin walled cavitary nodule in the superior left lower lobe (stable) 5 mm cavitary nodule in the right middle lobe (decreased from 7 mm) 5 mm cavitary nodule in the anterior left lung apex (decreased from 8 mm) Another previously noted left upper lobe nodule has resolved.   She is contacted by phone today to discuss adjuvant radiotherapy to the pancreas, and the question of possible SBRT to the lung nodules.  PREVIOUS RADIATION THERAPY: No   PAST MEDICAL HISTORY:  Past Medical History:  Diagnosis Date  . Anemia   . ASCUS (atypical squamous cells of undetermined significance) on Pap smear 08/06/1999  . Breast mass in female 2002   Left  . Cancer [facility])    Neuroendocrine Tumor of the pancreas  . Diabetes mellitus without complication ([place])   . Family history of pancreatic cancer   . Fibroid uterus 2010  . Hypertension   . Irregular bleeding 2011  . 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  PONV (postoperative nausea and vomiting)    nausea  vomitting after 12/25/18 ERCP  . 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 Yeast vaginitis 2006       PAST SURGICAL HISTORY: Past Surgical History:  Procedure Laterality Date  . BILIARY STENT PLACEMENT N/A 12/25/2018   Procedure: BILIARY STENT  PLACEMENT;  Surgeon: Carol Ada, MD;  Location: [facility];  Service: Endoscopy;  Laterality: N/A;  . BILIARY STENT PLACEMENT N/A 01/01/2019   Procedure: BILIARY STENT PLACEMENT;  Surgeon: Carol Ada, MD;  Location: [facility];  Service: Endoscopy;  Laterality: N/A;  . DILATATION & CURETTAGE/HYSTEROSCOPY WITH TRUECLEAR N/A 01/20/2014   Procedure: DILATATION & CURETTAGE/HYSTEROSCOPY WITH TRUCLEAR;  Surgeon: [name], MD;  Location: [facility] [facility];  Service: Gynecology;  Laterality: N/A;  . DILATATION & CURRETTAGE/HYSTEROSCOPY WITH RESECTOCOPE N/A 01/20/2014   Procedure: [place];  Surgeon: [name], MD;  Location: [facility];  Service: Gynecology;  Laterality: N/A;  . ENDOSCOPIC RETROGRADE CHOLANGIOPANCREATOGRAPHY (ERCP) WITH PROPOFOL N/A 12/25/2018   Procedure: ENDOSCOPIC RETROGRADE CHOLANGIOPANCREATOGRAPHY (ERCP) WITH PROPOFOL;  Surgeon: Carol Ada, MD;  [facility]: [facility];  Service: Endoscopy;  Laterality: N/A;  . ENDOSCOPIC RETROGRADE CHOLANGIOPANCREATOGRAPHY (ERCP) WITH PROPOFOL N/A 01/01/2019   Procedure: ENDOSCOPIC RETROGRADE CHOLANGIOPANCREATOGRAPHY (ERCP) WITH PROPOFOL;  Surgeon: Carol Ada, MD;  Location: [facility];  Service: Endoscopy;  Laterality: N/A;  . ERCP  12/25/2018  . FINE NEEDLE ASPIRATION N/A 01/01/2019   Procedure: FINE NEEDLE ASPIRATION (FNA) LINEAR;  Surgeon: Carol Ada, MD;  [facility]: [facility];  Service: Endoscopy;  Laterality: N/A;  . LAPAROSCOPY N/A 03/04/2019   Procedure: LAPAROSCOPY DIAGNOSTIC;  Surgeon: Stark Klein, MD;  Location: [place];  Service: General;  Laterality: N/A;  GENERAL AND EPIDURAL  . NO PAST SURGERIES    . PARTIAL NEPHRECTOMY Right 03/04/2019   Procedure: Nephrectomy Partial;  Surgeon: Cleon Gustin, MD;  Location: [place];  Service: Urology;  Laterality: Right;  . PORTACATH PLACEMENT Left 04/21/2019   Procedure: INSERTION PORT-A-CATH;  Surgeon: Stark Klein, MD;  Location: [place];  Service: General;  Laterality: Left;  . SPHINCTEROTOMY  12/25/2018   Procedure: SPHINCTEROTOMY;  Surgeon: Carol Ada, MD;  Location: [facility];  Service: Endoscopy;;  . Lavell Islam REMOVAL  01/01/2019   Procedure: STENT REMOVAL;  Surgeon: Carol Ada, MD;  Location: [facility];  Service: Endoscopy;;  . UPPER ESOPHAGEAL ENDOSCOPIC ULTRASOUND (EUS) N/A 01/01/2019   Procedure: UPPER ESOPHAGEAL ENDOSCOPIC ULTRASOUND (EUS);  Surgeon: Carol Ada, MD;  Location: [facility];  Service: Endoscopy;  Laterality: N/A;  . WHIPPLE PROCEDURE N/A 03/04/2019   Procedure: WHIPPLE PROCEDURE;  Surgeon: Stark Klein, MD;  Location: [facility];  Service: General;  Laterality: N/A;  GENERAL AND EPIDURAL     FAMILY HISTORY:  Family History  Problem Relation Age of Onset  . Diabetes Mother   . Dementia Mother   . Hyperlipidemia Mother   . Hypertension Mother   . Irregular heart beat Mother   . Diabetes Father   . Hypertension Father   . Hyperlipidemia Father   . Down syndrome Sister   . Diabetes Sister   . Hyperlipidemia Brother   . Heart Problems Brother   . Goiter Maternal Aunt   . Thyroid nodules Sister   . Cancer Cousin 60       eye; maternal first cousin  . Goiter Cousin   . Cancer Paternal Aunt        unknown form of cancer  . Cancer Cousin  unknown form of cancer; paternal first cousin  . Pancreatic cancer Cousin 51       paternal first cousin  . Cancer Cousin 60       unknown cancer; paternal first cousin  . Cancer Cousin 82       unknown cancer; paternal first cousin     SOCIAL HISTORY:  reports that she has never smoked. She has never used smokeless tobacco. She reports current alcohol use. She reports that she does not use drugs.  The patient is single and lives in [place]. 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 She has been able to work remotely due to the pandemic.   ALLERGIES: Cherry and Lemon oil   MEDICATIONS:  Current Outpatient  Medications  Medication Sig Dispense Refill  . Continuous Blood Gluc Receiver (FREESTYLE LIBRE 14 DAY READER) DEVI USE READER TO CHECK BLOOD GLUCOSE WITH FREESTYLE LIBRE SENSORS. 1 Device 0  . HUMALOG MIX 75/25 KWIKPEN (75-25) 100 UNIT/ML Kwikpen START WITH 10 UNITS BEFORE LUNCH AND INCREASE DAILY BY 2 UNITS UNTIL BEDTIME READINGS ARE BELOW 180    . Insulin Degludec (TRESIBA FLEXTOUCH Hydro) Inject 14-60 Units into the skin at bedtime.     . insulin lispro (HUMALOG KWIKPEN) 100 UNIT/ML KwikPen Inject 20 units under the skin three times daily before meals. (Patient taking differently: Inject 2-10 Units into the skin 3 (three) times daily as needed (high blood sugar or when eating carbs). ) 20 pen 3  . Insulin Pen Needle (NOVOFINE PLUS) 32G X 4 MM MISC Use 3 per day to inject insulin 300 each 1  . KLOR-CON M20 20 MEQ tablet TAKE 1 TABLET DAILY 90 tablet 3  . lidocaine-prilocaine (EMLA) cream Apply 1 application topically as directed. Apply 1 hour prior to stick and cover with plastic wrap 30 g 5  . lisinopril-hydrochlorothiazide (ZESTORETIC) 10-12.5 MG tablet TAKE 1 TABLET DAILY 90 tablet 3  . loratadine (CLARITIN) 10 MG tablet Take 10 mg by mouth at bedtime.     . metFORMIN (GLUCOPHAGE-XR) 500 MG 24 hr tablet Take 4 tablets (2000mg  total) by mouth once daily. 360 tablet 1  . rosuvastatin (CRESTOR) 10 MG tablet Take 10 mg by mouth daily.    . traMADol (ULTRAM) 50 MG tablet Take 1-2 tablets (50-100 mg total) by mouth every 6 (six) hours as needed for moderate pain or severe pain. 30 tablet 0  . baclofen (LIORESAL) 10 MG tablet Take 1 tablet (10 mg total) by mouth 3 (three) times daily as needed for muscle spasms. (Patient not taking: Reported on 08/30/2019) 30 each 0  . magnesium oxide (MAG-OX) 400 (241.3 Mg) MG tablet Take 1 tablet (400 mg total) by mouth 2 (two) times daily. 60 tablet 2  . ondansetron (ZOFRAN) 8 MG tablet Take 1 tablet (8 mg total) by mouth every 8 (eight) hours as needed for nausea or  vomiting. Start 72 hours after chemo treatment (Patient not taking: Reported on 08/30/2019) 20 tablet 0  . oxyCODONE (OXY IR/ROXICODONE) 5 MG immediate release tablet Take 1-2 tablets (5-10 mg total) by mouth every 4 (four) hours as needed for moderate pain, severe pain or breakthrough pain. (Patient not taking: Reported on 08/25/2019) 30 tablet 0  . pantoprazole (PROTONIX) 40 MG tablet Take 1 tablet (40 mg total) by mouth daily at 12 noon. (Patient not taking: Reported on 08/25/2019) 30 tablet 1  . prochlorperazine (COMPAZINE) 10 MG tablet Take 1 tablet (10 mg total) by mouth every 6 (six)  hours as needed. (Patient not taking: Reported on 08/25/2019) 60 tablet 1   No current facility-administered medications for this encounter.   Facility-Administered Medications Ordered in Other Encounters  Medication Dose Route Frequency Provider Last Rate Last Admin  . sodium chloride flush (NS) 0.9 % injection 10 mL  10 mL Intracatheter PRN Ladell Pier, MD   10 mL at 05/27/19 1342     REVIEW OF SYSTEMS: On review of systems, the patient reports that she is doing well overall. She received her first covid vaccine last Wednesday. She denies any chest pain, shortness of breath, cough, fevers, chills, night sweats, unintended weight changes. She denies any bowel or bladder disturbances, and denies abdominal pain, nausea or vomiting. She denies any new musculoskeletal or joint aches or pains, new skin lesions or concerns. A complete review of systems is obtained and is otherwise negative.     PHYSICAL EXAM:  Unable to assess due to encounter type.  ECOG = 1  0 - Asymptomatic (Fully active, able to carry on all predisease activities without restriction)  1 - Symptomatic but completely ambulatory (Restricted in physically strenuous activity but ambulatory and able to carry out work of a light or sedentary nature. For example, light housework, office work)  2 - Symptomatic, <50% in bed during the day  (Ambulatory and capable of all self care but unable to carry out any work activities. Up and about more than 50% of waking hours)  3 - Symptomatic, >50% in bed, but not bedbound (Capable of only limited self-care, confined to bed or chair 50% or more of waking hours)  4 - Bedbound (Completely disabled. Cannot carry on any self-care. Totally confined to bed or chair)  5 - Death   Eustace Pen MM, Creech RH, Tormey DC, et al. [phone]). "Toxicity and response criteria of the [facility]". [place] Oncol. 5 (6): 649-55    LABORATORY DATA:  Lab Results  Component Value Date   WBC 4.0 08/25/2019   HGB 10.2 (L) 08/25/2019   HCT 32.8 (L) 08/25/2019   MCV 95.3 08/25/2019   PLT 367 08/25/2019   Lab Results  Component Value Date   NA 141 08/25/2019   K 3.8 08/25/2019   CL 106 08/25/2019   CO2 26 08/25/2019   Lab Results  Component Value Date   ALT 42 08/25/2019   AST 41 08/25/2019   GGT 2,144 (HH) 12/08/2018   ALKPHOS 188 (H) 08/25/2019   BILITOT 0.4 08/25/2019      RADIOGRAPHY: CT CHEST WO CONTRAST  Result Date: 08/24/2019 CLINICAL DATA:  Follow-up pulmonary nodules. Pancreatic carcinoma. EXAM: CT CHEST WITHOUT CONTRAST TECHNIQUE: Multidetector CT imaging of the chest was performed following the standard protocol without IV contrast. COMPARISON:  07/02/2019 FINDINGS: Cardiovascular: No acute findings. Aortic atherosclerosis incidentally noted. Mediastinum/Nodes: No masses or pathologically enlarged lymph nodes identified on this unenhanced exam. Lungs/Pleura: 7 mm thin walled cavitary nodule in the superior left lower lobe on image 60/7 remains stable. A similar 5 mm cavitary nodule in the right middle lobe on image 65/7 is decreased in size from 7 mm previously. 5 mm cavitary nodule in the anterior left lung apex on image 19/7 has decreased in size from 8 mm on prior exam. Another nodule previously seen in the posterior left upper lobe has resolved. No new or enlarging  pulmonary nodules or masses identified. Upper Abdomen: Stable postop changes from Whipple procedure again noted. Stable 2.1 cm right adrenal mass consistent with benign adenoma.  Musculoskeletal:  No suspicious bone lesions. IMPRESSION: 1. Interval decrease in size of several small bilateral pulmonary nodules. 2. No new or progressive disease within the thorax. Aortic Atherosclerosis (ICD10-I70.0). Electronically Signed   By: Marlaine Hind M.D.   On: 08/24/2019 10:44       IMPRESSION/PLAN: 1. pT3N2Mx invasive adenocarcinoma of the pancreas. Dr. Lisbeth Renshaw discusses the pathology findings and reviews the nature of pancreatic cancer. He discusses with the patient that her case was reviewed in GI conference and she would benefit from chemoRT to reduce the risks of local recurrence given her higher risk surgical findings. We discussed the risks, benefits, short, and long term effects of radiotherapy. Dr. Lisbeth Renshaw discusses the delivery and logistics of radiotherapy and anticipates a course of 5 weeks of radiotherapy. She is interested in proceeding and is also having a second opinion later this week at [facility] with Dr. Altamease Oiler. She will come in for simulation on Friday of this week and will also let us know prior to that if her discussionw with Dr. Altamease Oiler changes any of these plans. She will sign consent at that time. 2. Bilateral lung nodules. Dr. Lisbeth Renshaw discusses our conversation from GI conference about her case last week and that stereotactic body radiotherapy (SBRT) may make sense if her lung lesions persisted or grew in her next interval scan. Some of her lesions have improved, so it is uncertain at this time if SBRT will be recommended, but Dr. Lisbeth Renshaw would like to reconsider this as an option with her next interval scan.  Given current concerns for patient exposure during the COVID-19 pandemic, this encounter was conducted via telephone.  The patient has provided two factor identification and has given verbal consent  for this type of encounter and has been advised to only accept a meeting of this type in a secure network environment. The time spent during this encounter was 60 minutes including preparation, discussion, and coordination of the patient's care. The attendants for this meeting include Dr. Lisbeth Renshaw, Hayden Pedro  and [name].  During the encounter, Dr. Lisbeth Renshaw, and Hayden Pedro were located at [facility] Radiation Oncology Department.  Kadeisha Betsch was located at home.      Carola Rhine, PAC

## 2019-09-02 DIAGNOSIS — D3A8 Other benign neuroendocrine tumors: Secondary | ICD-10-CM | POA: Diagnosis not present

## 2019-09-02 DIAGNOSIS — R918 Other nonspecific abnormal finding of lung field: Secondary | ICD-10-CM | POA: Diagnosis not present

## 2019-09-02 DIAGNOSIS — Z6831 Body mass index (BMI) 31.0-31.9, adult: Secondary | ICD-10-CM | POA: Diagnosis not present

## 2019-09-02 DIAGNOSIS — E119 Type 2 diabetes mellitus without complications: Secondary | ICD-10-CM | POA: Diagnosis not present

## 2019-09-02 DIAGNOSIS — C259 Malignant neoplasm of pancreas, unspecified: Secondary | ICD-10-CM | POA: Diagnosis not present

## 2019-09-03 ENCOUNTER — Other Ambulatory Visit: Payer: Self-pay

## 2019-09-03 ENCOUNTER — Ambulatory Visit
Admission: RE | Admit: 2019-09-03 | Discharge: 2019-09-03 | Disposition: A | Discharge status: Home / Self Care | Payer: BC Managed Care – PPO | Source: Ambulatory Visit | Attending: Radiation Oncology | Admitting: Radiation Oncology

## 2019-09-03 DIAGNOSIS — Z510 Encounter for antineoplastic radiation therapy: Secondary | ICD-10-CM | POA: Diagnosis not present

## 2019-09-03 DIAGNOSIS — C250 Malignant neoplasm of head of pancreas: Secondary | ICD-10-CM | POA: Diagnosis not present

## 2019-09-05 ENCOUNTER — Other Ambulatory Visit: Payer: Self-pay | Admitting: Endocrinology

## 2019-09-06 NOTE — Telephone Encounter (Signed)
Do you want to refill? 

## 2019-09-06 NOTE — Telephone Encounter (Signed)
Crestor has been stopped as of the last visit

## 2019-09-07 NOTE — Telephone Encounter (Signed)
Called pt and informed her about Dr. Ronnie Derby response below. States her PCP does manage Crestor and has already refilled her request. No further action required from our office. It seems refill request was sent by pharmacy erroneously.

## 2019-09-07 NOTE — Telephone Encounter (Signed)
Please send a message to patient to contact PCP regarding whether she wants her to stay on Crestor

## 2019-09-09 ENCOUNTER — Other Ambulatory Visit: Payer: Self-pay

## 2019-09-09 DIAGNOSIS — Z510 Encounter for antineoplastic radiation therapy: Secondary | ICD-10-CM | POA: Insufficient documentation

## 2019-09-09 DIAGNOSIS — D3A8 Other benign neuroendocrine tumors: Secondary | ICD-10-CM | POA: Diagnosis not present

## 2019-09-09 DIAGNOSIS — C250 Malignant neoplasm of head of pancreas: Secondary | ICD-10-CM | POA: Diagnosis not present

## 2019-09-09 MED ORDER — HUMALOG MIX 75/25 KWIKPEN (75-25) 100 UNIT/ML ~~LOC~~ SUPN: PEN_INJECTOR | SUBCUTANEOUS | 2 refills | Status: DC

## 2019-09-10 ENCOUNTER — Encounter: Payer: Self-pay | Admitting: *Deleted

## 2019-09-10 ENCOUNTER — Other Ambulatory Visit: Payer: Self-pay | Admitting: *Deleted

## 2019-09-10 ENCOUNTER — Encounter: Payer: Self-pay | Admitting: Oncology

## 2019-09-10 ENCOUNTER — Other Ambulatory Visit: Payer: Self-pay | Admitting: Oncology

## 2019-09-10 ENCOUNTER — Other Ambulatory Visit: Payer: Self-pay | Admitting: Endocrinology

## 2019-09-10 ENCOUNTER — Telehealth: Payer: Self-pay | Admitting: Pharmacist

## 2019-09-10 DIAGNOSIS — C250 Malignant neoplasm of head of pancreas: Secondary | ICD-10-CM

## 2019-09-10 MED ORDER — CAPECITABINE 500 MG PO TABS: ORAL_TABLET | ORAL | 0 refills | Status: DC

## 2019-09-10 MED FILL — CAPECITABINE 500 MG TABLET: 500 | 14 days supply | Qty: 98 | Fill #0

## 2019-09-10 NOTE — Progress Notes (Signed)
Patient called to report that RT starts 09/13/19--Dr. Benay Spice was not aware. Script for xeloda sent to [facility] and notified A. Hollice Espy, PharmD. Patient notified.

## 2019-09-10 NOTE — Telephone Encounter (Signed)
Oral Oncology Pharmacist Encounter  Received new prescription for Xeloda (capecitabine) for the treatment of pancreatic cancer in conjunction with XRT, planned duration until the end of radiation.  CMP from 08/25/19 assessed, no relevant lab abnormalities. Prescription dose and frequency assessed.   Current medication list in Epic reviewed, one DDIs with capecitabine identified: - Pantoprazole: Proton Pump Inhibitors (PPI) may diminish the therapeutic effect of capecitabine. Recommend evaluating the need for a PPI/acid suppression. If acid suppression is needed, recommend switching to a H2 antagonist (eg, famotidine) to avoid this DDI.   Due to insurance restriction the medication could not be filled at [facility]. Prescription has been e-scribed to [facility].  Supportive information was faxed to [place]. We will continue to follow medication access.   Darl Pikes, PharmD, BCPS, [facility] Hematology/Oncology Clinical Pharmacist [facility] [phone]  09/10/2019 4:17 PM   Oral Oncology Clinic will continue to follow for insurance authorization, copayment issues, initial counseling and start date.  Darl Pikes, PharmD, BCPS, BCOP, CPP Hematology/Oncology Clinical Pharmacist [facility] [phone]  09/10/2019 3:50 PM

## 2019-09-10 NOTE — Progress Notes (Signed)
Sent scheduling message for lab 4/5 or 4/6 and lab/OV on 4/16 or 4/19

## 2019-09-10 NOTE — Telephone Encounter (Signed)
Oral Chemotherapy Pharmacist Encounter   Called patient to let her know the prescription had to be sent to [facility]. No answer, LVM with the phone number to contact [place] phone number: [phone]  Darl Pikes, PharmD, BCPS, BCOP, CPP Hematology/Oncology Clinical Pharmacist [facility] [phone]  09/10/2019 4:33 PM

## 2019-09-13 ENCOUNTER — Inpatient Hospital Stay: Payer: BC Managed Care – PPO | Attending: Genetic Counselor

## 2019-09-13 ENCOUNTER — Telehealth: Payer: Self-pay | Admitting: *Deleted

## 2019-09-13 ENCOUNTER — Inpatient Hospital Stay: Payer: BC Managed Care – PPO

## 2019-09-13 ENCOUNTER — Other Ambulatory Visit: Payer: Self-pay

## 2019-09-13 ENCOUNTER — Ambulatory Visit
Admission: RE | Admit: 2019-09-13 | Discharge: 2019-09-13 | Disposition: A | Discharge status: Home / Self Care | Payer: BC Managed Care – PPO | Source: Ambulatory Visit | Attending: Radiation Oncology | Admitting: Radiation Oncology

## 2019-09-13 DIAGNOSIS — G620 Drug-induced polyneuropathy: Secondary | ICD-10-CM | POA: Insufficient documentation

## 2019-09-13 DIAGNOSIS — R918 Other nonspecific abnormal finding of lung field: Secondary | ICD-10-CM | POA: Insufficient documentation

## 2019-09-13 DIAGNOSIS — Z510 Encounter for antineoplastic radiation therapy: Secondary | ICD-10-CM | POA: Diagnosis not present

## 2019-09-13 DIAGNOSIS — C250 Malignant neoplasm of head of pancreas: Secondary | ICD-10-CM | POA: Insufficient documentation

## 2019-09-13 DIAGNOSIS — Z79899 Other long term (current) drug therapy: Secondary | ICD-10-CM | POA: Insufficient documentation

## 2019-09-13 DIAGNOSIS — I10 Essential (primary) hypertension: Secondary | ICD-10-CM | POA: Insufficient documentation

## 2019-09-13 DIAGNOSIS — Z9221 Personal history of antineoplastic chemotherapy: Secondary | ICD-10-CM | POA: Insufficient documentation

## 2019-09-13 DIAGNOSIS — Z905 Acquired absence of kidney: Secondary | ICD-10-CM | POA: Insufficient documentation

## 2019-09-13 DIAGNOSIS — E119 Type 2 diabetes mellitus without complications: Secondary | ICD-10-CM | POA: Diagnosis not present

## 2019-09-13 DIAGNOSIS — D3A8 Other benign neuroendocrine tumors: Secondary | ICD-10-CM | POA: Diagnosis not present

## 2019-09-13 LAB — CBC WITH DIFFERENTIAL (CANCER CENTER ONLY)
Abs Immature Granulocytes: 0.01 10*3/uL (ref 0.00–0.07)
Basophils Absolute: 0 10*3/uL (ref 0.0–0.1)
Basophils Relative: 1 %
Eosinophils Absolute: 0.1 10*3/uL (ref 0.0–0.5)
Eosinophils Relative: 1 %
HCT: 33.3 % — ABNORMAL LOW (ref 36.0–46.0)
Hemoglobin: 10.3 g/dL — ABNORMAL LOW (ref 12.0–15.0)
Immature Granulocytes: 0 %
Lymphocytes Relative: 52 %
Lymphs Abs: 2.9 10*3/uL (ref 0.7–4.0)
MCH: 29.7 pg (ref 26.0–34.0)
MCHC: 30.9 g/dL (ref 30.0–36.0)
MCV: 96 fL (ref 80.0–100.0)
Monocytes Absolute: 0.5 10*3/uL (ref 0.1–1.0)
Monocytes Relative: 8 %
Neutro Abs: 2.1 10*3/uL (ref 1.7–7.7)
Neutrophils Relative %: 38 %
Platelet Count: 349 10*3/uL (ref 150–400)
RBC: 3.47 MIL/uL — ABNORMAL LOW (ref 3.87–5.11)
RDW: 13.3 % (ref 11.5–15.5)
WBC Count: 5.5 10*3/uL (ref 4.0–10.5)
nRBC: 0 % (ref 0.0–0.2)

## 2019-09-13 NOTE — Telephone Encounter (Signed)
Oral Chemotherapy Pharmacist Encounter  Met patient in the lobby for Xeloda medication education. Provided patient with medication handout.  Patient Education I spoke with patient for overview of new oral chemotherapy medication: Xeloda (capecitabine) for the treatment of pancreatic cancer in conjunction with XRT, planned duration until the end of radiation.   Pt is doing well. Counseled patient on administration, dosing, side effects, monitoring, drug-food interactions, safe handling, storage, and disposal. Patient will take 4 tablets (2055m) by mouth in AM and 3 tablets (15042m in PM. Take within 3058m of meal. Take Mon-Fri. Take on days of radiation only.  Reviewed PPI drug=drug interaction with Ms. CarLoudenslagerhe reported no longer taking the pantoprazole. I removed this from her medication list.  Side effects include but not limited to: diarrhea, hand-foot syndrome, N/V, fatigue, decreased wbc.    Reviewed with patient importance of keeping a medication schedule and plan for any missed doses.  [name] understanding and appreciation. All questions answered. Medication handout provided.  [facility]. Patient knows to call the office with questions or concerns. Oral Chemotherapy Navigation [facility] will continue to follow.  AlyDarl PikesharmD, BCPS, BCOP, CPP Hematology/Oncology Clinical Pharmacist [phone]/10/2019 4:13 PM

## 2019-09-13 NOTE — Telephone Encounter (Signed)
Called patient with the CBC results--good baseline to begin Xeloda. Does not need to be seen on 4/7 and will reschedule for 4/19 or 4/20 and she agrees w/this. Expressed that she was very frustrated on Friday with the appointments and multiple calls and Mychart messages and getting her medication. She is trying to work and needs to miss as little time as possible.

## 2019-09-13 NOTE — Telephone Encounter (Signed)
Oral Chemotherapy Pharmacist Encounter   Patient returned my call and let me know she called [facility]. [facility] told her at the earliest she could receive her medication until at the earliest Tuesday 09/14/19. Patient was upset that she would not have her medication in time to start her with her radiation on Monday.  I called the insurance for Ms. Robina Ade to request a emergency fill exception to allow the patient to fill at [facility]. They require that this request come from the patient. I was able to conference Ms. Robina Ade into the phone call to make this request. The insurance authorized a one time 14 days fill from [facility]. I called [facility] and had them process the 14 day fill. I called Ms. Maxfield back and let her know the medication would be ready for pick up Saturday 09/11/19.   Darl Pikes, PharmD, BCPS, BCOP, CPP Hematology/Oncology Clinical Pharmacist [facility] [phone]  09/13/2019 10:33 AM

## 2019-09-14 ENCOUNTER — Ambulatory Visit
Admission: RE | Admit: 2019-09-14 | Discharge: 2019-09-14 | Disposition: A | Discharge status: Home / Self Care | Payer: BC Managed Care – PPO | Source: Ambulatory Visit | Attending: Radiation Oncology | Admitting: Radiation Oncology

## 2019-09-14 ENCOUNTER — Other Ambulatory Visit: Payer: Self-pay

## 2019-09-14 DIAGNOSIS — C250 Malignant neoplasm of head of pancreas: Secondary | ICD-10-CM | POA: Diagnosis not present

## 2019-09-14 DIAGNOSIS — Z510 Encounter for antineoplastic radiation therapy: Secondary | ICD-10-CM | POA: Diagnosis not present

## 2019-09-14 DIAGNOSIS — D3A8 Other benign neuroendocrine tumors: Secondary | ICD-10-CM | POA: Diagnosis not present

## 2019-09-15 ENCOUNTER — Inpatient Hospital Stay: Payer: BC Managed Care – PPO | Admitting: Oncology

## 2019-09-15 ENCOUNTER — Ambulatory Visit
Admission: RE | Admit: 2019-09-15 | Discharge: 2019-09-15 | Disposition: A | Discharge status: Home / Self Care | Payer: BC Managed Care – PPO | Source: Ambulatory Visit | Attending: Radiation Oncology | Admitting: Radiation Oncology

## 2019-09-15 ENCOUNTER — Other Ambulatory Visit: Payer: Self-pay

## 2019-09-15 DIAGNOSIS — D3A8 Other benign neuroendocrine tumors: Secondary | ICD-10-CM | POA: Diagnosis not present

## 2019-09-15 DIAGNOSIS — C250 Malignant neoplasm of head of pancreas: Secondary | ICD-10-CM | POA: Diagnosis not present

## 2019-09-15 DIAGNOSIS — Z510 Encounter for antineoplastic radiation therapy: Secondary | ICD-10-CM | POA: Diagnosis not present

## 2019-09-16 ENCOUNTER — Other Ambulatory Visit: Payer: Self-pay

## 2019-09-16 ENCOUNTER — Ambulatory Visit
Admission: RE | Admit: 2019-09-16 | Discharge: 2019-09-16 | Disposition: A | Discharge status: Home / Self Care | Payer: BC Managed Care – PPO | Source: Ambulatory Visit | Attending: Radiation Oncology | Admitting: Radiation Oncology

## 2019-09-16 DIAGNOSIS — D3A8 Other benign neuroendocrine tumors: Secondary | ICD-10-CM | POA: Diagnosis not present

## 2019-09-16 DIAGNOSIS — Z510 Encounter for antineoplastic radiation therapy: Secondary | ICD-10-CM | POA: Diagnosis not present

## 2019-09-16 DIAGNOSIS — C250 Malignant neoplasm of head of pancreas: Secondary | ICD-10-CM | POA: Diagnosis not present

## 2019-09-17 ENCOUNTER — Ambulatory Visit
Admission: RE | Admit: 2019-09-17 | Discharge: 2019-09-17 | Disposition: A | Discharge status: Home / Self Care | Payer: BC Managed Care – PPO | Source: Ambulatory Visit | Attending: Radiation Oncology | Admitting: Radiation Oncology

## 2019-09-17 ENCOUNTER — Other Ambulatory Visit: Payer: Self-pay

## 2019-09-17 DIAGNOSIS — C250 Malignant neoplasm of head of pancreas: Secondary | ICD-10-CM | POA: Diagnosis not present

## 2019-09-17 DIAGNOSIS — Z510 Encounter for antineoplastic radiation therapy: Secondary | ICD-10-CM | POA: Diagnosis not present

## 2019-09-17 DIAGNOSIS — D3A8 Other benign neuroendocrine tumors: Secondary | ICD-10-CM | POA: Diagnosis not present

## 2019-09-17 MED ORDER — SONAFINE EX EMUL
1.0000 "application " | Freq: Once | CUTANEOUS | Status: AC
  Administered 2019-09-17: 1 via TOPICAL

## 2019-09-17 NOTE — Progress Notes (Signed)
Pt here for patient teaching.  Pt given Radiation and You booklet, skin care instructions and Sonafine.  Reviewed areas of pertinence such as diarrhea, fatigue, hair loss, skin changes and urinary and bladder changes . Pt able to give teach back of to pat skin, use unscented/gentle soap, use baby wipes, have Imodium on hand, drink plenty of water and sitz bath,apply Sonafine bid and avoid applying anything to skin within 4 hours of treatment. Pt verbalizes understanding of information given and will contact nursing with any questions or concerns.     Gloriajean Dell. Leonie Green, BSN

## 2019-09-20 ENCOUNTER — Ambulatory Visit
Admission: RE | Admit: 2019-09-20 | Discharge: 2019-09-20 | Disposition: A | Discharge status: Home / Self Care | Payer: BC Managed Care – PPO | Source: Ambulatory Visit | Attending: Radiation Oncology | Admitting: Radiation Oncology

## 2019-09-20 ENCOUNTER — Other Ambulatory Visit: Payer: Self-pay

## 2019-09-20 DIAGNOSIS — Z510 Encounter for antineoplastic radiation therapy: Secondary | ICD-10-CM | POA: Diagnosis not present

## 2019-09-20 DIAGNOSIS — D3A8 Other benign neuroendocrine tumors: Secondary | ICD-10-CM | POA: Diagnosis not present

## 2019-09-20 DIAGNOSIS — C250 Malignant neoplasm of head of pancreas: Secondary | ICD-10-CM | POA: Diagnosis not present

## 2019-09-21 ENCOUNTER — Other Ambulatory Visit: Payer: Self-pay

## 2019-09-21 ENCOUNTER — Ambulatory Visit
Admission: RE | Admit: 2019-09-21 | Discharge: 2019-09-21 | Disposition: A | Discharge status: Home / Self Care | Payer: BC Managed Care – PPO | Source: Ambulatory Visit | Attending: Radiation Oncology | Admitting: Radiation Oncology

## 2019-09-21 DIAGNOSIS — D3001 Benign neoplasm of right kidney: Secondary | ICD-10-CM | POA: Diagnosis not present

## 2019-09-21 DIAGNOSIS — C250 Malignant neoplasm of head of pancreas: Secondary | ICD-10-CM | POA: Diagnosis not present

## 2019-09-21 DIAGNOSIS — D3A8 Other benign neuroendocrine tumors: Secondary | ICD-10-CM | POA: Diagnosis not present

## 2019-09-21 DIAGNOSIS — Z510 Encounter for antineoplastic radiation therapy: Secondary | ICD-10-CM | POA: Diagnosis not present

## 2019-09-22 ENCOUNTER — Other Ambulatory Visit: Payer: Self-pay

## 2019-09-22 ENCOUNTER — Ambulatory Visit
Admission: RE | Admit: 2019-09-22 | Discharge: 2019-09-22 | Disposition: A | Discharge status: Home / Self Care | Payer: BC Managed Care – PPO | Source: Ambulatory Visit | Attending: Radiation Oncology | Admitting: Radiation Oncology

## 2019-09-22 DIAGNOSIS — Z510 Encounter for antineoplastic radiation therapy: Secondary | ICD-10-CM | POA: Diagnosis not present

## 2019-09-22 DIAGNOSIS — C250 Malignant neoplasm of head of pancreas: Secondary | ICD-10-CM | POA: Diagnosis not present

## 2019-09-22 DIAGNOSIS — D3A8 Other benign neuroendocrine tumors: Secondary | ICD-10-CM | POA: Diagnosis not present

## 2019-09-23 ENCOUNTER — Other Ambulatory Visit: Payer: Self-pay

## 2019-09-23 ENCOUNTER — Ambulatory Visit
Admission: RE | Admit: 2019-09-23 | Discharge: 2019-09-23 | Disposition: A | Discharge status: Home / Self Care | Payer: BC Managed Care – PPO | Source: Ambulatory Visit | Attending: Radiation Oncology | Admitting: Radiation Oncology

## 2019-09-23 DIAGNOSIS — Z510 Encounter for antineoplastic radiation therapy: Secondary | ICD-10-CM | POA: Diagnosis not present

## 2019-09-23 DIAGNOSIS — D3A8 Other benign neuroendocrine tumors: Secondary | ICD-10-CM | POA: Diagnosis not present

## 2019-09-23 DIAGNOSIS — N2889 Other specified disorders of kidney and ureter: Secondary | ICD-10-CM | POA: Diagnosis not present

## 2019-09-23 DIAGNOSIS — D3001 Benign neoplasm of right kidney: Secondary | ICD-10-CM | POA: Diagnosis not present

## 2019-09-23 DIAGNOSIS — C250 Malignant neoplasm of head of pancreas: Secondary | ICD-10-CM | POA: Diagnosis not present

## 2019-09-24 ENCOUNTER — Ambulatory Visit
Admission: RE | Admit: 2019-09-24 | Discharge: 2019-09-24 | Disposition: A | Discharge status: Home / Self Care | Payer: BC Managed Care – PPO | Source: Ambulatory Visit | Attending: Radiation Oncology | Admitting: Radiation Oncology

## 2019-09-24 DIAGNOSIS — Z510 Encounter for antineoplastic radiation therapy: Secondary | ICD-10-CM | POA: Diagnosis not present

## 2019-09-24 DIAGNOSIS — D3A8 Other benign neuroendocrine tumors: Secondary | ICD-10-CM | POA: Diagnosis not present

## 2019-09-24 DIAGNOSIS — C250 Malignant neoplasm of head of pancreas: Secondary | ICD-10-CM | POA: Diagnosis not present

## 2019-09-27 ENCOUNTER — Other Ambulatory Visit: Payer: Self-pay

## 2019-09-27 ENCOUNTER — Ambulatory Visit
Admission: RE | Admit: 2019-09-27 | Discharge: 2019-09-27 | Disposition: A | Discharge status: Home / Self Care | Payer: BC Managed Care – PPO | Source: Ambulatory Visit | Attending: Radiation Oncology | Admitting: Radiation Oncology

## 2019-09-27 DIAGNOSIS — D3A8 Other benign neuroendocrine tumors: Secondary | ICD-10-CM | POA: Diagnosis not present

## 2019-09-27 DIAGNOSIS — Z510 Encounter for antineoplastic radiation therapy: Secondary | ICD-10-CM | POA: Diagnosis not present

## 2019-09-27 DIAGNOSIS — C250 Malignant neoplasm of head of pancreas: Secondary | ICD-10-CM | POA: Diagnosis not present

## 2019-09-28 ENCOUNTER — Ambulatory Visit
Admission: RE | Admit: 2019-09-28 | Discharge: 2019-09-28 | Disposition: A | Discharge status: Home / Self Care | Payer: BC Managed Care – PPO | Source: Ambulatory Visit | Attending: Radiation Oncology | Admitting: Radiation Oncology

## 2019-09-28 ENCOUNTER — Inpatient Hospital Stay: Payer: BC Managed Care – PPO

## 2019-09-28 ENCOUNTER — Other Ambulatory Visit: Payer: Self-pay

## 2019-09-28 ENCOUNTER — Encounter: Payer: Self-pay | Admitting: Nurse Practitioner

## 2019-09-28 ENCOUNTER — Inpatient Hospital Stay: Payer: BC Managed Care – PPO | Admitting: Nurse Practitioner

## 2019-09-28 VITALS — BP 125/88 | HR 70 | Temp 98.5°F | Resp 18 | Ht 67.8 in | Wt 206.5 lb

## 2019-09-28 DIAGNOSIS — C250 Malignant neoplasm of head of pancreas: Secondary | ICD-10-CM

## 2019-09-28 DIAGNOSIS — I10 Essential (primary) hypertension: Secondary | ICD-10-CM | POA: Diagnosis not present

## 2019-09-28 DIAGNOSIS — D3A8 Other benign neuroendocrine tumors: Secondary | ICD-10-CM | POA: Diagnosis not present

## 2019-09-28 DIAGNOSIS — Z95828 Presence of other vascular implants and grafts: Secondary | ICD-10-CM

## 2019-09-28 DIAGNOSIS — Z510 Encounter for antineoplastic radiation therapy: Secondary | ICD-10-CM | POA: Diagnosis not present

## 2019-09-28 DIAGNOSIS — E119 Type 2 diabetes mellitus without complications: Secondary | ICD-10-CM | POA: Diagnosis not present

## 2019-09-28 DIAGNOSIS — Z905 Acquired absence of kidney: Secondary | ICD-10-CM | POA: Diagnosis not present

## 2019-09-28 DIAGNOSIS — Z79899 Other long term (current) drug therapy: Secondary | ICD-10-CM | POA: Diagnosis not present

## 2019-09-28 DIAGNOSIS — G620 Drug-induced polyneuropathy: Secondary | ICD-10-CM | POA: Diagnosis not present

## 2019-09-28 DIAGNOSIS — R918 Other nonspecific abnormal finding of lung field: Secondary | ICD-10-CM | POA: Diagnosis not present

## 2019-09-28 DIAGNOSIS — Z9221 Personal history of antineoplastic chemotherapy: Secondary | ICD-10-CM | POA: Diagnosis not present

## 2019-09-28 LAB — CMP (CANCER CENTER ONLY)
ALT: 21 U/L (ref 0–44)
AST: 22 U/L (ref 15–41)
Albumin: 3.6 g/dL (ref 3.5–5.0)
Alkaline Phosphatase: 87 U/L (ref 38–126)
Anion gap: 10 (ref 5–15)
BUN: 10 mg/dL (ref 6–20)
CO2: 24 mmol/L (ref 22–32)
Calcium: 9.1 mg/dL (ref 8.9–10.3)
Chloride: 104 mmol/L (ref 98–111)
Creatinine: 0.88 mg/dL (ref 0.44–1.00)
GFR, Est AFR Am: >60 mL/min (ref >60)
GFR, Estimated: >60 mL/min (ref >60)
Glucose, Bld: 262 mg/dL — ABNORMAL HIGH (ref 70–99)
Potassium: 3.5 mmol/L (ref 3.5–5.1)
Sodium: 138 mmol/L (ref 135–145)
Total Bilirubin: 0.4 mg/dL (ref 0.3–1.2)
Total Protein: 7.3 g/dL (ref 6.5–8.1)

## 2019-09-28 LAB — CBC WITH DIFFERENTIAL (CANCER CENTER ONLY)
Abs Immature Granulocytes: 0.01 10*3/uL (ref 0.00–0.07)
Basophils Absolute: 0 10*3/uL (ref 0.0–0.1)
Basophils Relative: 1 %
Eosinophils Absolute: 0.5 10*3/uL (ref 0.0–0.5)
Eosinophils Relative: 14 %
HCT: 32.3 % — ABNORMAL LOW (ref 36.0–46.0)
Hemoglobin: 10.4 g/dL — ABNORMAL LOW (ref 12.0–15.0)
Immature Granulocytes: 0 %
Lymphocytes Relative: 15 %
Lymphs Abs: 0.5 10*3/uL — ABNORMAL LOW (ref 0.7–4.0)
MCH: 29.7 pg (ref 26.0–34.0)
MCHC: 32.2 g/dL (ref 30.0–36.0)
MCV: 92.3 fL (ref 80.0–100.0)
Monocytes Absolute: 0.3 10*3/uL (ref 0.1–1.0)
Monocytes Relative: 10 %
Neutro Abs: 2 10*3/uL (ref 1.7–7.7)
Neutrophils Relative %: 60 %
Platelet Count: 326 10*3/uL (ref 150–400)
RBC: 3.5 MIL/uL — ABNORMAL LOW (ref 3.87–5.11)
RDW: 13.1 % (ref 11.5–15.5)
WBC Count: 3.3 10*3/uL — ABNORMAL LOW (ref 4.0–10.5)
nRBC: 0 % (ref 0.0–0.2)

## 2019-09-28 MED ORDER — HEPARIN SOD (PORK) LOCK FLUSH 100 UNIT/ML IV SOLN
500.0000 [IU] | Freq: Once | INTRAVENOUS | Status: AC
  Administered 2019-09-28: 500 [IU]
  Filled 2019-09-28: qty 5

## 2019-09-28 MED ORDER — SODIUM CHLORIDE 0.9% FLUSH
10.0000 mL | Freq: Once | INTRAVENOUS | Status: AC
  Administered 2019-09-28: 10 mL
  Filled 2019-09-28: qty 10

## 2019-09-28 NOTE — Progress Notes (Signed)
  Alyssa Ross   Diagnosis: Pancreas cancer  INTERVAL HISTORY:   Alyssa Ross returns as scheduled.  She continues radiation/Xeloda.  She has intermittent mild queasiness.  No vomiting.  No mouth sores.  She has had 3 episodes of loose stools since beginning concurrent therapy.  No hand or foot pain or redness.  She has noted improvement in neuropathy symptoms.  Objective:  Vital signs in last 24 hours:  Blood pressure 125/88, pulse 70, temperature 98.5 F (36.9 C), temperature source Temporal, resp. rate 18, height 5' 7.8" (1.722 m), weight 206 lb 8 oz (93.7 kg), last menstrual period 09/17/2012, SpO2 100 %.    HEENT: No thrush or ulcers. GI: Abdomen soft and nontender.  No hepatomegaly.  No mass. Vascular: No leg edema.  Skin: Palms without erythema. Port-A-Cath without erythema.   Lab Results:  Lab Results  Component Value Date   WBC 3.3 (L) 09/28/2019   HGB 10.4 (L) 09/28/2019   HCT 32.3 (L) 09/28/2019   MCV 92.3 09/28/2019   PLT 326 09/28/2019   NEUTROABS 2.0 09/28/2019    Imaging:  No results found.  Medications: I have reviewed the patient's current medications.  Assessment/Plan: 1. 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 Tumor invades the duodenal wall and vascular groove, resection margins negative, 4/34 lymph nodes positive ? MSI-stable, tumor showed instability in 2 loci as did adjacent normal tissue ? 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 CTs 01/29/2019-ill-defined pancreas head mass, 5 pulmonary nodules-1 with a small amount of central cavitation, tumor abuts the left margin of the portal vein indistinct density surrounding, hepatic artery, complex cystic lesion of the right kidney, right adrenal mass-characterized as an adenoma on a [facility] MRI 12/21/2018 ? 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 Elevated preoperative CA 19-9 ? CT chest 04/16/2019-multiple bilateral pulmonary nodules, some with increased cavitation, stable in size ? Cycle 1 FOLFIRINOX 04/27/2019 ? Cycle 2 FOLFIRINOX 05/11/2019 ? Cycle 3 FOLFIRINOX 05/23/2019 ? Cycle 4 FOLFIRINOX 06/08/2019 ? Cycle 5 FOLFIRINOX 06/22/2019 ? CT chest 07/02/2019-stable size of bilateral pulmonary nodules.  Dominant cavitary lesions in both lungs show increased cavitation with thinner walls.  Stable 2.1 cm right adrenal nodule. ? Cycle 6 FOLFIRINOX 07/06/2019 ? Cycle 7 FOLFIRINOX 07/21/2019 ? Cycle 8 FOLFIRINOX 08/03/2019, oxaliplatin deleted secondary to neuropathy ? CT chest 08/24/2019-, decreased size of several lung nodules with resolution of a left upper lobe nodule, no new nodules ? Radiation/Xeloda 09/13/2019  2. 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 Diabetes 4. Hypertension 5. Family history of pancreas cancer, INVITAE panel-VUS in the TERT 6. Port-A-Cath placement, Dr. 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  She continues radiation/Xeloda.  She is tolerating Xeloda well.  We reviewed the CBC from today.  Counts adequate to continue.  She will return for lab and follow-up in 2 weeks.  She will contact the office in the interim with any problems.    Ned Card ANP/GNP-BC   09/28/2019  2:12 PM

## 2019-09-28 NOTE — Patient Instructions (Signed)

## 2019-09-29 ENCOUNTER — Other Ambulatory Visit: Payer: Self-pay

## 2019-09-29 ENCOUNTER — Telehealth: Payer: Self-pay | Admitting: Nurse Practitioner

## 2019-09-29 ENCOUNTER — Ambulatory Visit
Admission: RE | Admit: 2019-09-29 | Discharge: 2019-09-29 | Disposition: A | Discharge status: Home / Self Care | Payer: BC Managed Care – PPO | Source: Ambulatory Visit | Attending: Radiation Oncology | Admitting: Radiation Oncology

## 2019-09-29 DIAGNOSIS — Z124 Encounter for screening for malignant neoplasm of cervix: Secondary | ICD-10-CM | POA: Diagnosis not present

## 2019-09-29 DIAGNOSIS — D3A8 Other benign neuroendocrine tumors: Secondary | ICD-10-CM | POA: Diagnosis not present

## 2019-09-29 DIAGNOSIS — Z01419 Encounter for gynecological examination (general) (routine) without abnormal findings: Secondary | ICD-10-CM | POA: Diagnosis not present

## 2019-09-29 DIAGNOSIS — C250 Malignant neoplasm of head of pancreas: Secondary | ICD-10-CM | POA: Diagnosis not present

## 2019-09-29 DIAGNOSIS — Z510 Encounter for antineoplastic radiation therapy: Secondary | ICD-10-CM | POA: Diagnosis not present

## 2019-09-29 NOTE — Telephone Encounter (Signed)
Scheduled per los. Called and left msg. Mailed printout  °

## 2019-09-30 ENCOUNTER — Ambulatory Visit
Admission: RE | Admit: 2019-09-30 | Discharge: 2019-09-30 | Disposition: A | Discharge status: Home / Self Care | Payer: BC Managed Care – PPO | Source: Ambulatory Visit | Attending: Radiation Oncology | Admitting: Radiation Oncology

## 2019-09-30 ENCOUNTER — Other Ambulatory Visit: Payer: Self-pay

## 2019-09-30 DIAGNOSIS — C250 Malignant neoplasm of head of pancreas: Secondary | ICD-10-CM | POA: Diagnosis not present

## 2019-09-30 DIAGNOSIS — Z510 Encounter for antineoplastic radiation therapy: Secondary | ICD-10-CM | POA: Diagnosis not present

## 2019-09-30 DIAGNOSIS — D3A8 Other benign neuroendocrine tumors: Secondary | ICD-10-CM | POA: Diagnosis not present

## 2019-10-01 ENCOUNTER — Other Ambulatory Visit: Payer: Self-pay

## 2019-10-01 ENCOUNTER — Ambulatory Visit
Admission: RE | Admit: 2019-10-01 | Discharge: 2019-10-01 | Disposition: A | Discharge status: Home / Self Care | Payer: BC Managed Care – PPO | Source: Ambulatory Visit | Attending: Radiation Oncology | Admitting: Radiation Oncology

## 2019-10-01 DIAGNOSIS — Z510 Encounter for antineoplastic radiation therapy: Secondary | ICD-10-CM | POA: Diagnosis not present

## 2019-10-01 DIAGNOSIS — C250 Malignant neoplasm of head of pancreas: Secondary | ICD-10-CM | POA: Diagnosis not present

## 2019-10-01 DIAGNOSIS — D3A8 Other benign neuroendocrine tumors: Secondary | ICD-10-CM | POA: Diagnosis not present

## 2019-10-04 ENCOUNTER — Other Ambulatory Visit: Payer: Self-pay

## 2019-10-04 ENCOUNTER — Ambulatory Visit
Admission: RE | Admit: 2019-10-04 | Discharge: 2019-10-04 | Disposition: A | Discharge status: Home / Self Care | Payer: BC Managed Care – PPO | Source: Ambulatory Visit | Attending: Radiation Oncology | Admitting: Radiation Oncology

## 2019-10-04 DIAGNOSIS — Z510 Encounter for antineoplastic radiation therapy: Secondary | ICD-10-CM | POA: Diagnosis not present

## 2019-10-04 DIAGNOSIS — C250 Malignant neoplasm of head of pancreas: Secondary | ICD-10-CM | POA: Diagnosis not present

## 2019-10-04 DIAGNOSIS — D3A8 Other benign neuroendocrine tumors: Secondary | ICD-10-CM | POA: Diagnosis not present

## 2019-10-05 ENCOUNTER — Ambulatory Visit
Admission: RE | Admit: 2019-10-05 | Discharge: 2019-10-05 | Disposition: A | Discharge status: Home / Self Care | Payer: BC Managed Care – PPO | Source: Ambulatory Visit | Attending: Radiation Oncology | Admitting: Radiation Oncology

## 2019-10-05 ENCOUNTER — Other Ambulatory Visit: Payer: Self-pay

## 2019-10-05 ENCOUNTER — Encounter: Payer: Self-pay | Admitting: Endocrinology

## 2019-10-05 ENCOUNTER — Telehealth (INDEPENDENT_AMBULATORY_CARE_PROVIDER_SITE_OTHER): Payer: BC Managed Care – PPO | Admitting: Endocrinology

## 2019-10-05 DIAGNOSIS — I10 Essential (primary) hypertension: Secondary | ICD-10-CM | POA: Diagnosis not present

## 2019-10-05 DIAGNOSIS — Z510 Encounter for antineoplastic radiation therapy: Secondary | ICD-10-CM | POA: Diagnosis not present

## 2019-10-05 DIAGNOSIS — E1165 Type 2 diabetes mellitus with hyperglycemia: Secondary | ICD-10-CM

## 2019-10-05 DIAGNOSIS — C250 Malignant neoplasm of head of pancreas: Secondary | ICD-10-CM | POA: Diagnosis not present

## 2019-10-05 DIAGNOSIS — Z794 Long term (current) use of insulin: Secondary | ICD-10-CM

## 2019-10-05 DIAGNOSIS — D3A8 Other benign neuroendocrine tumors: Secondary | ICD-10-CM | POA: Diagnosis not present

## 2019-10-05 NOTE — Progress Notes (Signed)
Patient ID: Alyssa Ross, female   DOB: 11/21/1965, 54 y.o.   MRN: [id number]  I connected with the above-named patient by video enabled telemedicine application and verified that I am speaking with the correct person. The patient was explained the limitations of evaluation and management by telemedicine and the availability of in person appointments.  Patient also understood that there may be a patient responsible charge related to this service . Location of the patient: Patient's home . Location of the provider: Physician office Only the patient and myself were participating in the encounter The patient understood the above statements and agreed to proceed.   Reason for Appointment: Diabetes follow-up    History of Present Illness   Diagnosis: Type 2 DIABETES MELITUS, date of diagnosis 2005   She had previously been on metformin and Amaryl Because of inadequate control she was given Victoza in addition in 2011 With this her blood sugars have been significantly better and her A1c has been as low as 6.2 in 2013 Her blood sugars have been more difficult to control since 2014 an A1c consistently over 7% She  was started on insulin with Levemir in 06/2013 because of  increase in her A1c to 8.5% She had previously been taking Victoza along with Amaryl and metformin without consistent control Also had difficulty losing weight despite taking 1.8 mg Victoza, also had tried the 3 mg dose Because of episodic nausea and diarrhea in 1/16 she went off her Victoza  RECENT history:   INSULIN dose: TRESIBA 14 units pm.  Humalog 5 or 6 units rarely at dinnertime, NovoLog Mix 70/30, 24 units at lunch  Non-insulin hypoglycemic drugs: on metformin 2250 mg,  A1c previously was  8.6   Current management, blood sugars and problems identified as follows  She has had overall better blood sugars with starting premixed insulin, previously was having consistently high readings over 200 in the  afternoons and evenings  She has also started the 70/30 insulin as directed at lunchtime  Also has continued to increase this from the starting dose of 12 units  With this her blood sugars are generally better in the afternoon and evening but appear to be going up significantly after breakfast now  She is drinking sweet tea in the morning frequently and will usually eat a meal also with some carbohydrate in the morning  She was told to cover her evening carbohydrate intake with Humalog but she did not do so  Lab glucose was 262 in the early afternoon last week  She said that she did not get time to exercise  She has gained back a lot of weight since February     Side effects from medications: None   Monitors blood glucose: With freestyle Libre sensor   CONTINUOUS GLUCOSE MONITORING RECORD INTERPRETATION    Dates of Recording: 4/13 through 4/26  Sensor description: Elenor Legato  Results statistics:   CGM use % of time  96  Average and SD  169, GV 38  Time in range        51%  % Time Above 180  35  % Time above 250  10  % Time Below target  4     PRE-MEAL Fasting Lunch Dinner Bedtime Overall  Glucose range:       Mean/median:  125  213  182   169   POST-MEAL PC Breakfast PC Lunch PC Dinner  Glucose range:     Mean/median:  189  220  194  Glycemic patterns summary: Blood sugars rising markedly after about 8 AM, peaking around 3-4 PM at an average of about 230.  Lowest blood sugars are around 6-7 AM  Hyperglycemic episodes are occurring frequently after breakfast and lunch and periodically after dinner  Hypoglycemic episodes occurred 3 times, twice during the night and once before dinnertime transiently  Overnight periods: Blood sugars are averaging about 185 at midnight and then progressively coming down until about 6-7 AM with hypoglycemia twice  Preprandial periods: Blood sugars are near normal at breakfast, typically high at lunchtime and frequently at dinnertime  also  Postprandial periods:   After breakfast:   Blood sugars are generally rising significantly to about 200 or more After lunch:    Blood sugars are rising further after lunch but on an average day about the same After dinner: Blood sugar rise is not significant except if the blood sugar is near normal before eating   The previous data:  CGM use % of time  70  2-week average/SD  224, GV 30  Time in range     26   %  % Time Above 180  32  % Time above 250  41  % Time Below 70  1     PRE-MEAL Fasting Lunch Dinner Bedtime Overall  Glucose range:       Averages:  172  192  269     POST-MEAL PC Breakfast PC Lunch PC Dinner  Glucose range:     Averages:   236  283       Meals: 3 meals per day. Dinner 8-9 pm     DIET: As above         Wt Readings from Last 3 Encounters:  09/28/19 206 lb 8 oz (93.7 kg)  08/25/19 202 lb 12.8 oz (92 kg)  08/03/19 196 lb 12.8 oz (89.3 kg)    LABS:  Lab Results  Component Value Date   HGBA1C 8.6 (H) 07/26/2019   HGBA1C 8.3 (H) 04/02/2019   HGBA1C 8.9 (H) 03/02/2019   Lab Results  Component Value Date   MICROALBUR 10 07/26/2019   [place] 91 07/26/2019   CREATININE 0.88 09/28/2019    No visits with results within 1 Week(s) from this visit.  Latest known visit with results is:  Appointment on 09/28/2019  Component Date Value Ref Range Status  . WBC Count 09/28/2019 3.3* 4.0 - 10.5 K/uL Final  . RBC 09/28/2019 3.50* 3.87 - 5.11 MIL/uL Final  . Hemoglobin 09/28/2019 10.4* 12.0 - 15.0 g/dL Final  . HCT 09/28/2019 32.3* 36.0 - 46.0 % Final  . MCV 09/28/2019 92.3  80.0 - 100.0 fL Final  . MCH 09/28/2019 29.7  26.0 - 34.0 pg Final  . MCHC 09/28/2019 32.2  30.0 - 36.0 g/dL Final  . RDW 09/28/2019 13.1  11.5 - 15.5 % Final  . Platelet Count 09/28/2019 326  150 - 400 K/uL Final  . nRBC 09/28/2019 0.0  0.0 - 0.2 % Final  . Neutrophils Relative % 09/28/2019 60  % Final  . Neutro Abs 09/28/2019 2.0  1.7 - 7.7 K/uL Final  . Lymphocytes  Relative 09/28/2019 15  % Final  . Lymphs Abs 09/28/2019 0.5* 0.7 - 4.0 K/uL Final  . Monocytes Relative 09/28/2019 10  % Final  . Monocytes Absolute 09/28/2019 0.3  0.1 - 1.0 K/uL Final  . Eosinophils Relative 09/28/2019 14  % Final  . Eosinophils Absolute 09/28/2019 0.5  0.0 - 0.5 K/uL Final  . Basophils  Relative 09/28/2019 1  % Final  . Basophils Absolute 09/28/2019 0.0  0.0 - 0.1 K/uL Final  . Immature Granulocytes 09/28/2019 0  % Final  . Abs Immature Granulocytes 09/28/2019 0.01  0.00 - 0.07 K/uL Final   Performed at [facility], [place] [address] [place], [place] [address]  . Sodium 09/28/2019 138  135 - 145 mmol/L Final  . Potassium 09/28/2019 3.5  3.5 - 5.1 mmol/L Final  . Chloride 09/28/2019 104  98 - 111 mmol/L Final  . CO2 09/28/2019 24  22 - 32 mmol/L Final  . Glucose, Bld 09/28/2019 262* 70 - 99 mg/dL Final   Glucose reference range applies only to samples taken after fasting for at least 8 hours.  . BUN 09/28/2019 10  6 - 20 mg/dL Final  . Creatinine 09/28/2019 0.88  0.44 - 1.00 mg/dL Final  . Calcium 09/28/2019 9.1  8.9 - 10.3 mg/dL Final  . Total Protein 09/28/2019 7.3  6.5 - 8.1 g/dL Final  . Albumin 09/28/2019 3.6  3.5 - 5.0 g/dL Final  . AST 09/28/2019 22  15 - 41 U/L Final  . ALT 09/28/2019 21  0 - 44 U/L Final  . Alkaline Phosphatase 09/28/2019 87  38 - 126 U/L Final  . Total Bilirubin 09/28/2019 0.4  0.3 - 1.2 mg/dL Final  . GFR, Est Non Af Am 09/28/2019 >60  >60 mL/min Final  . GFR, Est AFR Am 09/28/2019 >60  >60 mL/min Final  . Anion gap 09/28/2019 10  5 - 15 Final   Performed at [facility], [place] [address] [place], [place] [address]    Allergies as of 10/05/2019      Reactions   Cherry Rash   Lemon Oil Rash      Medication List       Accurate as of October 05, 2019 10:40 AM. If you have any questions, ask your nurse or doctor.        baclofen 10 MG tablet Commonly known as: LIORESAL Take 1 tablet  (10 mg total) by mouth 3 (three) times daily as needed for muscle spasms.   capecitabine 500 MG tablet Commonly known as: XELODA Take 4 tablets (2000mg ) by mouth in AM and 3 tablets (1500mg ) in PM. Take within 99mins of meal. Take Mon-Fri. Take on days of radiation only.   FreeStyle Libre 14 Day Reader Kerrin Mo USE READER TO CHECK BLOOD GLUCOSE WITH FREESTYLE LIBRE SENSORS.   HumaLOG Mix 75/25 KwikPen (75-25) 100 UNIT/ML Kwikpen Generic drug: Insulin Lispro Prot & Lispro START WITH 10 UNITS BEFORE LUNCH AND INCREASE DAILY BY 2 UNITS UNTIL BEDTIME READINGS ARE BELOW 180   insulin lispro 100 UNIT/ML KwikPen Commonly known as: HumaLOG KwikPen Inject 20 units under the skin three times daily before meals. What changed:   how much to take  how to take this  when to take this  reasons to take this  additional instructions   Klor-Con M20 20 MEQ tablet Generic drug: potassium chloride SA TAKE 1 TABLET DAILY   lidocaine-prilocaine cream Commonly known as: EMLA Apply 1 application topically as directed. Apply 1 hour prior to stick and cover with plastic wrap   lisinopril-hydrochlorothiazide 10-12.5 MG tablet Commonly known as: ZESTORETIC TAKE 1 TABLET DAILY   loratadine 10 MG tablet Commonly known as: CLARITIN Take 10 mg by mouth at bedtime.   magnesium oxide 400 (241.3 Mg) MG tablet Commonly known as: MAG-OX Take 1 tablet (400 mg total) by mouth  2 (two) times daily.   metFORMIN 500 MG 24 hr tablet Commonly known as: GLUCOPHAGE-XR Take 4 tablets (2000mg  total) by mouth once daily.   NovoFine Plus 32G X 4 MM Misc Generic drug: Insulin Pen Needle USE TO INJECT INSULIN THREE TIMES DAILY   ondansetron 8 MG tablet Commonly known as: ZOFRAN Take 1 tablet (8 mg total) by mouth every 8 (eight) hours as needed for nausea or vomiting. Start 72 hours after chemo treatment   oxyCODONE 5 MG immediate release tablet Commonly known as: Oxy IR/ROXICODONE Take 1-2 tablets (5-10 mg  total) by mouth every 4 (four) hours as needed for moderate pain, severe pain or breakthrough pain.   prochlorperazine 10 MG tablet Commonly known as: COMPAZINE Take 1 tablet (10 mg total) by mouth every 6 (six) hours as needed.   rosuvastatin 10 MG tablet Commonly known as: CRESTOR Take 10 mg by mouth daily.   traMADol 50 MG tablet Commonly known as: ULTRAM Take 1-2 tablets (50-100 mg total) by mouth every 6 (six) hours as needed for moderate pain or severe pain.   TRESIBA FLEXTOUCH [place] Inject 14-60 Units into the skin at bedtime.       Allergies:  Allergies  Allergen Reactions  . Cherry Rash  . Lemon Oil Rash    Past Medical History:  Diagnosis Date  . Anemia   . ASCUS (atypical squamous cells of undetermined significance) on Pap smear 08/06/1999  . Breast mass in female 2002   Left  . Cancer [facility])    Neuroendocrine Tumor of the pancreas  . Diabetes mellitus without complication ([place])   . Family history of pancreatic cancer   . Fibroid uterus 2010  . Hypertension   . Irregular bleeding 2011  . 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 PONV (postoperative nausea and vomiting)    nausea  vomitting after 12/25/18 ERCP  . 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 Yeast vaginitis 2006    Past Surgical History:  Procedure Laterality Date  . BILIARY STENT PLACEMENT N/A 12/25/2018   Procedure: BILIARY STENT PLACEMENT;  Surgeon: Carol Ada, MD;  Location: [facility];  Service: Endoscopy;  Laterality: N/A;  . BILIARY STENT PLACEMENT N/A 01/01/2019   Procedure: BILIARY STENT PLACEMENT;  Surgeon: Carol Ada, MD;  Location: [facility];  Service: Endoscopy;  Laterality: N/A;  . DILATATION & CURETTAGE/HYSTEROSCOPY WITH TRUECLEAR N/A 01/20/2014   Procedure: DILATATION & CURETTAGE/HYSTEROSCOPY WITH TRUCLEAR;  Surgeon: [name], MD;  Location: [facility];  Service: Gynecology;  Laterality: N/A;  . DILATATION & CURRETTAGE/HYSTEROSCOPY  WITH RESECTOCOPE N/A 01/20/2014   Procedure: [place];  Surgeon: [name], MD;  Location: [facility];  Service: Gynecology;  Laterality: N/A;  . ENDOSCOPIC RETROGRADE CHOLANGIOPANCREATOGRAPHY (ERCP) WITH PROPOFOL N/A 12/25/2018   Procedure: ENDOSCOPIC RETROGRADE CHOLANGIOPANCREATOGRAPHY (ERCP) WITH PROPOFOL;  Surgeon: Carol Ada, MD;  Location: [facility];  Service: Endoscopy;  Laterality: N/A;  . ENDOSCOPIC RETROGRADE CHOLANGIOPANCREATOGRAPHY (ERCP) WITH PROPOFOL N/A 01/01/2019   Procedure: ENDOSCOPIC RETROGRADE CHOLANGIOPANCREATOGRAPHY (ERCP) WITH PROPOFOL;  Surgeon: Carol Ada, MD;  Location: [facility];  Service: Endoscopy;  Laterality: N/A;  . ERCP  12/25/2018  . FINE NEEDLE ASPIRATION N/A 01/01/2019   Procedure: FINE NEEDLE ASPIRATION (FNA) LINEAR;  Surgeon: Carol Ada, MD;  Location: [facility];  Service: Endoscopy;  Laterality: N/A;  . LAPAROSCOPY N/A 03/04/2019   Procedure: LAPAROSCOPY DIAGNOSTIC;  Surgeon: Stark Klein, MD;  Location: [place];  Service: General;  Laterality: N/A;  GENERAL AND EPIDURAL  .  NO PAST SURGERIES    . PARTIAL NEPHRECTOMY Right 03/04/2019   Procedure: Nephrectomy Partial;  Surgeon: Cleon Gustin, MD;  Location: [place];  Service: Urology;  Laterality: Right;  . PORTACATH PLACEMENT Left 04/21/2019   Procedure: INSERTION PORT-A-CATH;  Surgeon: Stark Klein, MD;  Location: [place];  Service: General;  Laterality: Left;  . SPHINCTEROTOMY  12/25/2018   Procedure: SPHINCTEROTOMY;  Surgeon: Carol Ada, MD;  Location: [facility];  Service: Endoscopy;;  . Lavell Islam REMOVAL  01/01/2019   Procedure: STENT REMOVAL;  Surgeon: Carol Ada, MD;  Location: [facility];  Service: Endoscopy;;  . UPPER ESOPHAGEAL ENDOSCOPIC ULTRASOUND (EUS) N/A 01/01/2019   Procedure: UPPER ESOPHAGEAL ENDOSCOPIC ULTRASOUND (EUS);  Surgeon: Carol Ada, MD;  Location: [facility];  Service: Endoscopy;  Laterality: N/A;  . WHIPPLE  PROCEDURE N/A 03/04/2019   Procedure: WHIPPLE PROCEDURE;  Surgeon: Stark Klein, MD;  [facility];  Service: General;  Laterality: N/A;  GENERAL AND EPIDURAL    Family History  Problem Relation Age of Onset  . Diabetes Mother   . Dementia Mother   . Hyperlipidemia Mother   . Hypertension Mother   . Irregular heart beat Mother   . Diabetes Father   . Hypertension Father   . Hyperlipidemia Father   . Down syndrome Sister   . Diabetes Sister   . Hyperlipidemia Brother   . Heart Problems Brother   . Goiter Maternal Aunt   . Thyroid nodules Sister   . Cancer Cousin 60       eye; maternal first cousin  . Goiter Cousin   . Cancer Paternal Aunt        unknown form of cancer  . Cancer Cousin        unknown form of cancer; paternal first cousin  . Pancreatic cancer Cousin 74       paternal first cousin  . Cancer Cousin 40       unknown cancer; paternal first cousin  . Cancer Cousin 71       unknown cancer; paternal first cousin    Social History:  reports that she has never smoked. She has never used smokeless tobacco. She reports current alcohol use. She reports that she does not use drugs.  REVIEW of systems:   She is being followed by her PCP for hypertension with lisinopril HCT 10/12 .5 mg daily She has been followed in the oncology clinic regularly   BP Readings from Last 3 Encounters:  09/28/19 125/88  08/25/19 121/86  08/05/19 129/88     Has had hypercholesterolemia, was taking Crestor 20 mg but this was stopped by PCP,  Labs as follows  Lab Results  Component Value Date   CHOL 190 07/26/2019   HDL 75 07/26/2019   LDLCALC 91 07/26/2019   LDLDIRECT 97.7 02/11/2014   TRIG 139 07/26/2019   CHOLHDL 2.5 07/26/2019      Examination:   LMP 09/17/2012   There is no height or weight on file to calculate BMI.    Assessment/Plan:   Diabetes type 2, uncontrolled  See history of present illness for detailed discussion of her current management, blood  sugar patterns and problems identified  Her A1c is usually over 8% and last 8.6  Her blood sugars are still out-of-control especially after meals  Although she has finally taken more insulin as directed using the 70/30 insulin her blood sugars are now highest between breakfast and lunch This is depending on her diet and consuming sweet tea Overnight blood  sugars again come down significantly with only a small amount of Tresiba currently  Average blood sugar is improved compared to the last visit but fructosamine or more recent A1c results are not available Currently still has only 50% of readings within the target range in the last 2 weeks  She can still do better on her diet and exercise regimen Is continuing to take Metformin   Recommendations:  She will use the NovoLog mix at breakfast instead of lunch  May need to adjust them further if blood sugars are higher in the afternoon consistently  Also likely may need another dose of mealtime coverage with Humalog at dinnertime especially if she is eating carbohydrates to keep blood sugars from rising further  May reduce Tresiba if morning sugars are more consistently lower  Change Metformin to 2000 mg using the 500 mg tablets daily once her 750 supply runs out  Periodically check fingerstick readings to confirm accuracy of the freestyle libre  A1c on the next visit  Start walking regularly for exercise  Hypertension: Continue follow-up with PCP, has not had an office blood pressure reading here   Follow-up in 8 weeks  There are no Patient Instructions on file for this visit.      Elayne Snare 10/05/2019, 10:40 AM   Note: This office note was prepared with Dragon voice recognition system technology. Any transcriptional errors that result from this process are unintentional.

## 2019-10-06 ENCOUNTER — Other Ambulatory Visit: Payer: Self-pay

## 2019-10-06 ENCOUNTER — Ambulatory Visit
Admission: RE | Admit: 2019-10-06 | Discharge: 2019-10-06 | Disposition: A | Discharge status: Home / Self Care | Payer: BC Managed Care – PPO | Source: Ambulatory Visit | Attending: Radiation Oncology | Admitting: Radiation Oncology

## 2019-10-06 DIAGNOSIS — C250 Malignant neoplasm of head of pancreas: Secondary | ICD-10-CM | POA: Diagnosis not present

## 2019-10-06 DIAGNOSIS — Z510 Encounter for antineoplastic radiation therapy: Secondary | ICD-10-CM | POA: Diagnosis not present

## 2019-10-06 DIAGNOSIS — D3A8 Other benign neuroendocrine tumors: Secondary | ICD-10-CM | POA: Diagnosis not present

## 2019-10-07 ENCOUNTER — Ambulatory Visit
Admission: RE | Admit: 2019-10-07 | Discharge: 2019-10-07 | Disposition: A | Discharge status: Home / Self Care | Payer: BC Managed Care – PPO | Source: Ambulatory Visit | Attending: Radiation Oncology | Admitting: Radiation Oncology

## 2019-10-07 ENCOUNTER — Other Ambulatory Visit: Payer: Self-pay

## 2019-10-07 DIAGNOSIS — D3A8 Other benign neuroendocrine tumors: Secondary | ICD-10-CM | POA: Diagnosis not present

## 2019-10-07 DIAGNOSIS — C250 Malignant neoplasm of head of pancreas: Secondary | ICD-10-CM | POA: Diagnosis not present

## 2019-10-07 DIAGNOSIS — Z510 Encounter for antineoplastic radiation therapy: Secondary | ICD-10-CM | POA: Diagnosis not present

## 2019-10-08 ENCOUNTER — Ambulatory Visit
Admission: RE | Admit: 2019-10-08 | Discharge: 2019-10-08 | Disposition: A | Discharge status: Home / Self Care | Payer: BC Managed Care – PPO | Source: Ambulatory Visit | Attending: Radiation Oncology | Admitting: Radiation Oncology

## 2019-10-08 ENCOUNTER — Other Ambulatory Visit: Payer: Self-pay

## 2019-10-08 DIAGNOSIS — D3A8 Other benign neuroendocrine tumors: Secondary | ICD-10-CM

## 2019-10-08 DIAGNOSIS — C250 Malignant neoplasm of head of pancreas: Secondary | ICD-10-CM | POA: Diagnosis not present

## 2019-10-08 DIAGNOSIS — Z510 Encounter for antineoplastic radiation therapy: Secondary | ICD-10-CM | POA: Diagnosis not present

## 2019-10-08 MED ORDER — SONAFINE EX EMUL
1.0000 "application " | Freq: Once | CUTANEOUS | Status: AC
  Administered 2019-10-08: 1 via TOPICAL

## 2019-10-11 ENCOUNTER — Ambulatory Visit
Admission: RE | Admit: 2019-10-11 | Discharge: 2019-10-11 | Disposition: A | Discharge status: Home / Self Care | Payer: BC Managed Care – PPO | Source: Ambulatory Visit | Attending: Radiation Oncology | Admitting: Radiation Oncology

## 2019-10-11 ENCOUNTER — Other Ambulatory Visit: Payer: Self-pay

## 2019-10-11 DIAGNOSIS — Z510 Encounter for antineoplastic radiation therapy: Secondary | ICD-10-CM | POA: Diagnosis not present

## 2019-10-11 DIAGNOSIS — D3A8 Other benign neuroendocrine tumors: Secondary | ICD-10-CM | POA: Insufficient documentation

## 2019-10-11 DIAGNOSIS — C250 Malignant neoplasm of head of pancreas: Secondary | ICD-10-CM | POA: Insufficient documentation

## 2019-10-12 ENCOUNTER — Other Ambulatory Visit: Payer: Self-pay

## 2019-10-12 ENCOUNTER — Ambulatory Visit
Admission: RE | Admit: 2019-10-12 | Discharge: 2019-10-12 | Disposition: A | Discharge status: Home / Self Care | Payer: BC Managed Care – PPO | Source: Ambulatory Visit | Attending: Radiation Oncology | Admitting: Radiation Oncology

## 2019-10-12 DIAGNOSIS — C250 Malignant neoplasm of head of pancreas: Secondary | ICD-10-CM | POA: Diagnosis not present

## 2019-10-12 DIAGNOSIS — D3A8 Other benign neuroendocrine tumors: Secondary | ICD-10-CM | POA: Diagnosis not present

## 2019-10-12 DIAGNOSIS — Z510 Encounter for antineoplastic radiation therapy: Secondary | ICD-10-CM | POA: Diagnosis not present

## 2019-10-13 ENCOUNTER — Other Ambulatory Visit: Payer: Self-pay

## 2019-10-13 ENCOUNTER — Encounter: Payer: Self-pay | Admitting: Nurse Practitioner

## 2019-10-13 ENCOUNTER — Telehealth: Payer: Self-pay | Admitting: *Deleted

## 2019-10-13 ENCOUNTER — Inpatient Hospital Stay: Payer: BC Managed Care – PPO | Attending: Genetic Counselor | Admitting: Nurse Practitioner

## 2019-10-13 ENCOUNTER — Ambulatory Visit
Admission: RE | Admit: 2019-10-13 | Discharge: 2019-10-13 | Disposition: A | Discharge status: Home / Self Care | Payer: BC Managed Care – PPO | Source: Ambulatory Visit | Attending: Radiation Oncology | Admitting: Radiation Oncology

## 2019-10-13 ENCOUNTER — Inpatient Hospital Stay: Payer: BC Managed Care – PPO

## 2019-10-13 VITALS — BP 116/82 | HR 99 | Temp 98.7°F | Resp 18 | Ht 67.8 in | Wt 205.5 lb

## 2019-10-13 DIAGNOSIS — Z510 Encounter for antineoplastic radiation therapy: Secondary | ICD-10-CM | POA: Diagnosis not present

## 2019-10-13 DIAGNOSIS — Z9221 Personal history of antineoplastic chemotherapy: Secondary | ICD-10-CM | POA: Insufficient documentation

## 2019-10-13 DIAGNOSIS — E876 Hypokalemia: Secondary | ICD-10-CM | POA: Insufficient documentation

## 2019-10-13 DIAGNOSIS — T451X5D Adverse effect of antineoplastic and immunosuppressive drugs, subsequent encounter: Secondary | ICD-10-CM | POA: Diagnosis not present

## 2019-10-13 DIAGNOSIS — Z905 Acquired absence of kidney: Secondary | ICD-10-CM | POA: Diagnosis not present

## 2019-10-13 DIAGNOSIS — I10 Essential (primary) hypertension: Secondary | ICD-10-CM | POA: Diagnosis not present

## 2019-10-13 DIAGNOSIS — C250 Malignant neoplasm of head of pancreas: Secondary | ICD-10-CM

## 2019-10-13 DIAGNOSIS — Z79899 Other long term (current) drug therapy: Secondary | ICD-10-CM | POA: Insufficient documentation

## 2019-10-13 DIAGNOSIS — Z923 Personal history of irradiation: Secondary | ICD-10-CM | POA: Diagnosis not present

## 2019-10-13 DIAGNOSIS — R918 Other nonspecific abnormal finding of lung field: Secondary | ICD-10-CM | POA: Insufficient documentation

## 2019-10-13 DIAGNOSIS — G620 Drug-induced polyneuropathy: Secondary | ICD-10-CM | POA: Insufficient documentation

## 2019-10-13 DIAGNOSIS — E119 Type 2 diabetes mellitus without complications: Secondary | ICD-10-CM | POA: Insufficient documentation

## 2019-10-13 DIAGNOSIS — D3A8 Other benign neuroendocrine tumors: Secondary | ICD-10-CM

## 2019-10-13 DIAGNOSIS — Z85528 Personal history of other malignant neoplasm of kidney: Secondary | ICD-10-CM | POA: Insufficient documentation

## 2019-10-13 LAB — CMP (CANCER CENTER ONLY)
ALT: 40 U/L (ref 0–44)
AST: 53 U/L — ABNORMAL HIGH (ref 15–41)
Albumin: 3.8 g/dL (ref 3.5–5.0)
Alkaline Phosphatase: 83 U/L (ref 38–126)
Anion gap: 8 (ref 5–15)
BUN: 9 mg/dL (ref 6–20)
CO2: 30 mmol/L (ref 22–32)
Calcium: 9.2 mg/dL (ref 8.9–10.3)
Chloride: 103 mmol/L (ref 98–111)
Creatinine: 0.89 mg/dL (ref 0.44–1.00)
GFR, Est AFR Am: >60 mL/min (ref >60)
GFR, Estimated: >60 mL/min (ref >60)
Glucose, Bld: 120 mg/dL — ABNORMAL HIGH (ref 70–99)
Potassium: 2.8 mmol/L — CL (ref 3.5–5.1)
Sodium: 141 mmol/L (ref 135–145)
Total Bilirubin: 0.5 mg/dL (ref 0.3–1.2)
Total Protein: 7.3 g/dL (ref 6.5–8.1)

## 2019-10-13 LAB — CBC WITH DIFFERENTIAL (CANCER CENTER ONLY)
Abs Immature Granulocytes: 0.01 10*3/uL (ref 0.00–0.07)
Basophils Absolute: 0 10*3/uL (ref 0.0–0.1)
Basophils Relative: 0 %
Eosinophils Absolute: 0.2 10*3/uL (ref 0.0–0.5)
Eosinophils Relative: 7 %
HCT: 31.8 % — ABNORMAL LOW (ref 36.0–46.0)
Hemoglobin: 10.5 g/dL — ABNORMAL LOW (ref 12.0–15.0)
Immature Granulocytes: 0 %
Lymphocytes Relative: 11 %
Lymphs Abs: 0.3 10*3/uL — ABNORMAL LOW (ref 0.7–4.0)
MCH: 30.2 pg (ref 26.0–34.0)
MCHC: 33 g/dL (ref 30.0–36.0)
MCV: 91.4 fL (ref 80.0–100.0)
Monocytes Absolute: 0.4 10*3/uL (ref 0.1–1.0)
Monocytes Relative: 13 %
Neutro Abs: 1.9 10*3/uL (ref 1.7–7.7)
Neutrophils Relative %: 69 %
Platelet Count: 237 10*3/uL (ref 150–400)
RBC: 3.48 MIL/uL — ABNORMAL LOW (ref 3.87–5.11)
RDW: 13.8 % (ref 11.5–15.5)
WBC Count: 2.7 10*3/uL — ABNORMAL LOW (ref 4.0–10.5)
nRBC: 0 % (ref 0.0–0.2)

## 2019-10-13 LAB — MAGNESIUM: Magnesium: 1.5 mg/dL — ABNORMAL LOW (ref 1.7–2.4)

## 2019-10-13 NOTE — Telephone Encounter (Signed)
CRITICAL VALUE STICKER  CRITICAL VALUE:  RECEIVER (on-site recipient of call): Manuela Schwartz, [place] NOTIFIED: 10/13/19 @ 1530  MESSENGER (representative from lab): Lelan Pons   MD NOTIFIED: Ned Card, NP  TIME OF NOTIFICATION: 1535   RESPONSE: Increase KCL to 20 meq bid x 4 days, then daily and instruct to take Mg+ BID as ordered. Recheck labs on 10/18/19. Left VM for patient to take Mg+ bid and KCL bid. Recheck labs on 5/10 at 3:30 prior to RT. Requested return call or mychart message to confirm.

## 2019-10-13 NOTE — Progress Notes (Signed)
[facility] OFFICE PROGRESS NOTE   Diagnosis: Pancreas cancer  INTERVAL HISTORY:   Alyssa Ross returns as scheduled.  She continues radiation and Xeloda.  She denies nausea/vomiting.  No mouth sores.  Stools are loose at times.  She does not characterize this as diarrhea.  No hand or foot pain or redness.  No fever, cough, shortness of breath.  Progressive fatigue.  Neuropathy symptoms unchanged.  Objective:  Vital signs in last 24 hours:  Blood pressure 116/82, pulse 99, temperature 98.7 F (37.1 C), temperature source Temporal, resp. rate 18, height 5' 7.8" (1.722 m), weight 205 lb 8 oz (93.2 kg), last menstrual period 09/17/2012, SpO2 100 %.    HEENT: No thrush or ulcers. Resp: Lungs clear bilaterally. Cardio: Regular rate and rhythm. GI: Abdomen soft and nontender.  No hepatomegaly. Vascular: No leg edema. Skin: Palms without erythema. Port-A-Cath without erythema.   Lab Results:  Lab Results  Component Value Date   WBC 2.7 (L) 10/13/2019   HGB 10.5 (L) 10/13/2019   HCT 31.8 (L) 10/13/2019   MCV 91.4 10/13/2019   PLT 237 10/13/2019   NEUTROABS 1.9 10/13/2019    Imaging:  No results found.  Medications: I have reviewed the patient's current medications.  Assessment/Plan: 1. 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 Tumor invades the duodenal wall and vascular groove, resection margins negative, 4/34 lymph nodes positive ? MSI-stable, tumor showed instability in 2 loci as did adjacent normal tissue ? 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 CTs 01/29/2019-ill-defined pancreas head mass, 5 pulmonary nodules-1 with a small amount of central cavitation, tumor abuts the left margin of the portal vein indistinct density surrounding, hepatic artery, complex cystic lesion of the right kidney, right adrenal mass-characterized as an adenoma on a [facility] MRI  12/21/2018 ? 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 Elevated preoperative CA 19-9 ? CT chest 04/16/2019-multiple bilateral pulmonary nodules, some with increased cavitation, stable in size ? Cycle 1 FOLFIRINOX 04/27/2019 ? Cycle 2 FOLFIRINOX 05/11/2019 ? Cycle 3 FOLFIRINOX 05/23/2019 ? Cycle 4 FOLFIRINOX 06/08/2019 ? Cycle 5 FOLFIRINOX 06/22/2019 ? CT chest 07/02/2019-stable size of bilateral pulmonary nodules. Dominant cavitary lesions in both lungs show increased cavitation with thinner walls. Stable 2.1 cm right adrenal nodule. ? Cycle 6 FOLFIRINOX 07/06/2019 ? Cycle 7 FOLFIRINOX 07/21/2019 ? Cycle 8 FOLFIRINOX 08/03/2019, oxaliplatin deleted secondary to neuropathy ? CT chest 08/24/2019-decreased size of several lung nodules with resolution of a left upper lobe nodule, no new nodules ? Radiation/Xeloda 09/13/2019  2. 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 Diabetes 4. Hypertension 5. Family history of pancreas cancer, INVITAE panel-VUS in the TERT 6. Port-A-Cath placement, Dr. 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  She continues radiation/Xeloda.  She is scheduled to complete the course of concurrent therapy 10/20/2019.  Plan for a restaging chest CT at an approximate 53-monthinterval from the previous.  We reviewed the CBC from today.  Counts adequate to continue with treatment as above.  The chemistry panel resulted after she left the office.  She has hypokalemia and hypomagnesemia.  She will increase potassium to 20 mEq twice daily for 4 days, then 20 mEq daily.  She will resume magnesium 400 mg twice daily.  We will repeat a basic metabolic panel on 57/62/2633  She will return for lab and follow-up in  3 weeks.  She will contact the office in the interim with any  problems.    Ned Card ANP/GNP-BC   10/13/2019  3:05 PM

## 2019-10-14 ENCOUNTER — Other Ambulatory Visit: Payer: Self-pay

## 2019-10-14 ENCOUNTER — Ambulatory Visit
Admission: RE | Admit: 2019-10-14 | Discharge: 2019-10-14 | Disposition: A | Discharge status: Home / Self Care | Payer: BC Managed Care – PPO | Source: Ambulatory Visit | Attending: Radiation Oncology | Admitting: Radiation Oncology

## 2019-10-14 DIAGNOSIS — Z510 Encounter for antineoplastic radiation therapy: Secondary | ICD-10-CM | POA: Diagnosis not present

## 2019-10-14 DIAGNOSIS — D3A8 Other benign neuroendocrine tumors: Secondary | ICD-10-CM | POA: Diagnosis not present

## 2019-10-14 DIAGNOSIS — C250 Malignant neoplasm of head of pancreas: Secondary | ICD-10-CM | POA: Diagnosis not present

## 2019-10-14 LAB — GLUCOSE, CAPILLARY
Glucose-Capillary: 59 mg/dL — ABNORMAL LOW (ref 70–99)
Glucose-Capillary: 79 mg/dL (ref 70–99)

## 2019-10-14 NOTE — Telephone Encounter (Signed)
Patient left VM asking why she has a lab appointment on 5/10. Called back and left VM explaining her low K+ and Mg+ yesterday and to increase KCL to 20 meq bid x 4 days, then daily. Also take Mg+ twice daily and explained difficult to get K+ to go up if Mg+ stays low. Requested call to confirm she received message.

## 2019-10-15 ENCOUNTER — Ambulatory Visit
Admission: RE | Admit: 2019-10-15 | Discharge: 2019-10-15 | Disposition: A | Discharge status: Home / Self Care | Payer: BC Managed Care – PPO | Source: Ambulatory Visit | Attending: Radiation Oncology | Admitting: Radiation Oncology

## 2019-10-15 ENCOUNTER — Other Ambulatory Visit: Payer: Self-pay

## 2019-10-15 ENCOUNTER — Other Ambulatory Visit: Payer: Self-pay | Admitting: Oncology

## 2019-10-15 ENCOUNTER — Encounter: Payer: Self-pay | Admitting: Oncology

## 2019-10-15 DIAGNOSIS — Z510 Encounter for antineoplastic radiation therapy: Secondary | ICD-10-CM | POA: Diagnosis not present

## 2019-10-15 DIAGNOSIS — C250 Malignant neoplasm of head of pancreas: Secondary | ICD-10-CM | POA: Diagnosis not present

## 2019-10-15 DIAGNOSIS — D3A8 Other benign neuroendocrine tumors: Secondary | ICD-10-CM | POA: Diagnosis not present

## 2019-10-15 MED ORDER — INSULIN LISPRO (1 UNIT DIAL) 100 UNIT/ML (KWIKPEN): PEN_INJECTOR | SUBCUTANEOUS | 3 refills | Status: DC

## 2019-10-18 ENCOUNTER — Other Ambulatory Visit: Payer: Self-pay

## 2019-10-18 ENCOUNTER — Inpatient Hospital Stay: Payer: BC Managed Care – PPO

## 2019-10-18 ENCOUNTER — Ambulatory Visit
Admission: RE | Admit: 2019-10-18 | Discharge: 2019-10-18 | Disposition: A | Discharge status: Home / Self Care | Payer: BC Managed Care – PPO | Source: Ambulatory Visit | Attending: Radiation Oncology | Admitting: Radiation Oncology

## 2019-10-18 DIAGNOSIS — T451X5D Adverse effect of antineoplastic and immunosuppressive drugs, subsequent encounter: Secondary | ICD-10-CM | POA: Diagnosis not present

## 2019-10-18 DIAGNOSIS — E876 Hypokalemia: Secondary | ICD-10-CM | POA: Diagnosis not present

## 2019-10-18 DIAGNOSIS — Z923 Personal history of irradiation: Secondary | ICD-10-CM | POA: Diagnosis not present

## 2019-10-18 DIAGNOSIS — G620 Drug-induced polyneuropathy: Secondary | ICD-10-CM | POA: Diagnosis not present

## 2019-10-18 DIAGNOSIS — C250 Malignant neoplasm of head of pancreas: Secondary | ICD-10-CM

## 2019-10-18 DIAGNOSIS — I10 Essential (primary) hypertension: Secondary | ICD-10-CM | POA: Diagnosis not present

## 2019-10-18 DIAGNOSIS — Z510 Encounter for antineoplastic radiation therapy: Secondary | ICD-10-CM | POA: Diagnosis not present

## 2019-10-18 DIAGNOSIS — D3A8 Other benign neuroendocrine tumors: Secondary | ICD-10-CM | POA: Diagnosis not present

## 2019-10-18 DIAGNOSIS — Z905 Acquired absence of kidney: Secondary | ICD-10-CM | POA: Diagnosis not present

## 2019-10-18 DIAGNOSIS — E119 Type 2 diabetes mellitus without complications: Secondary | ICD-10-CM | POA: Diagnosis not present

## 2019-10-18 DIAGNOSIS — Z79899 Other long term (current) drug therapy: Secondary | ICD-10-CM | POA: Diagnosis not present

## 2019-10-18 DIAGNOSIS — R918 Other nonspecific abnormal finding of lung field: Secondary | ICD-10-CM | POA: Diagnosis not present

## 2019-10-18 DIAGNOSIS — Z85528 Personal history of other malignant neoplasm of kidney: Secondary | ICD-10-CM | POA: Diagnosis not present

## 2019-10-18 DIAGNOSIS — Z9221 Personal history of antineoplastic chemotherapy: Secondary | ICD-10-CM | POA: Diagnosis not present

## 2019-10-18 LAB — CMP (CANCER CENTER ONLY)
ALT: 59 U/L — ABNORMAL HIGH (ref 0–44)
AST: 54 U/L — ABNORMAL HIGH (ref 15–41)
Albumin: 3.8 g/dL (ref 3.5–5.0)
Alkaline Phosphatase: 93 U/L (ref 38–126)
Anion gap: 7 (ref 5–15)
BUN: 10 mg/dL (ref 6–20)
CO2: 30 mmol/L (ref 22–32)
Calcium: 9.4 mg/dL (ref 8.9–10.3)
Chloride: 103 mmol/L (ref 98–111)
Creatinine: 1.02 mg/dL — ABNORMAL HIGH (ref 0.44–1.00)
GFR, Est AFR Am: >60 mL/min (ref >60)
GFR, Estimated: >60 mL/min (ref >60)
Glucose, Bld: 161 mg/dL — ABNORMAL HIGH (ref 70–99)
Potassium: 3.7 mmol/L (ref 3.5–5.1)
Sodium: 140 mmol/L (ref 135–145)
Total Bilirubin: 0.4 mg/dL (ref 0.3–1.2)
Total Protein: 7.4 g/dL (ref 6.5–8.1)

## 2019-10-18 LAB — CBC WITH DIFFERENTIAL (CANCER CENTER ONLY)
Abs Immature Granulocytes: 0.01 10*3/uL (ref 0.00–0.07)
Basophils Absolute: 0 10*3/uL (ref 0.0–0.1)
Basophils Relative: 1 %
Eosinophils Absolute: 0.3 10*3/uL (ref 0.0–0.5)
Eosinophils Relative: 8 %
HCT: 32.1 % — ABNORMAL LOW (ref 36.0–46.0)
Hemoglobin: 10.6 g/dL — ABNORMAL LOW (ref 12.0–15.0)
Immature Granulocytes: 0 %
Lymphocytes Relative: 6 %
Lymphs Abs: 0.2 10*3/uL — ABNORMAL LOW (ref 0.7–4.0)
MCH: 30.2 pg (ref 26.0–34.0)
MCHC: 33 g/dL (ref 30.0–36.0)
MCV: 91.5 fL (ref 80.0–100.0)
Monocytes Absolute: 0.4 10*3/uL (ref 0.1–1.0)
Monocytes Relative: 9 %
Neutro Abs: 3.1 10*3/uL (ref 1.7–7.7)
Neutrophils Relative %: 76 %
Platelet Count: 283 10*3/uL (ref 150–400)
RBC: 3.51 MIL/uL — ABNORMAL LOW (ref 3.87–5.11)
RDW: 14 % (ref 11.5–15.5)
WBC Count: 4.1 10*3/uL (ref 4.0–10.5)
nRBC: 0 % (ref 0.0–0.2)

## 2019-10-18 LAB — MAGNESIUM: Magnesium: 1.5 mg/dL — ABNORMAL LOW (ref 1.7–2.4)

## 2019-10-19 ENCOUNTER — Other Ambulatory Visit: Payer: Self-pay

## 2019-10-19 ENCOUNTER — Ambulatory Visit
Admission: RE | Admit: 2019-10-19 | Discharge: 2019-10-19 | Disposition: A | Discharge status: Home / Self Care | Payer: BC Managed Care – PPO | Source: Ambulatory Visit | Attending: Radiation Oncology | Admitting: Radiation Oncology

## 2019-10-19 DIAGNOSIS — D3A8 Other benign neuroendocrine tumors: Secondary | ICD-10-CM | POA: Diagnosis not present

## 2019-10-19 DIAGNOSIS — C250 Malignant neoplasm of head of pancreas: Secondary | ICD-10-CM | POA: Diagnosis not present

## 2019-10-19 DIAGNOSIS — Z510 Encounter for antineoplastic radiation therapy: Secondary | ICD-10-CM | POA: Diagnosis not present

## 2019-10-19 MED ORDER — HUMALOG MIX 75/25 KWIKPEN (75-25) 100 UNIT/ML ~~LOC~~ SUPN: PEN_INJECTOR | SUBCUTANEOUS | 2 refills | Status: DC

## 2019-10-20 ENCOUNTER — Encounter: Payer: Self-pay | Admitting: Radiation Oncology

## 2019-10-20 ENCOUNTER — Ambulatory Visit
Admission: RE | Admit: 2019-10-20 | Discharge: 2019-10-20 | Disposition: A | Discharge status: Home / Self Care | Payer: BC Managed Care – PPO | Source: Ambulatory Visit | Attending: Radiation Oncology | Admitting: Radiation Oncology

## 2019-10-20 ENCOUNTER — Telehealth: Payer: Self-pay | Admitting: *Deleted

## 2019-10-20 ENCOUNTER — Telehealth: Payer: Self-pay | Admitting: Oncology

## 2019-10-20 ENCOUNTER — Other Ambulatory Visit: Payer: Self-pay

## 2019-10-20 DIAGNOSIS — Z510 Encounter for antineoplastic radiation therapy: Secondary | ICD-10-CM | POA: Diagnosis not present

## 2019-10-20 DIAGNOSIS — D3A8 Other benign neuroendocrine tumors: Secondary | ICD-10-CM | POA: Diagnosis not present

## 2019-10-20 DIAGNOSIS — C250 Malignant neoplasm of head of pancreas: Secondary | ICD-10-CM | POA: Diagnosis not present

## 2019-10-20 NOTE — Telephone Encounter (Signed)
-----   Message from Owens Shark, NP sent at 10/20/2019 10:49 AM EDT ----- Please let her know potassium is better, continue K. Dur 20 mEq daily.  Magnesium is stable, continue magnesium oxide 400 mg twice daily.  Follow-up as scheduled.

## 2019-10-20 NOTE — Telephone Encounter (Signed)
Called pt per 5/12 sch message - unable to reach pt . Left message with appt date and time

## 2019-10-20 NOTE — Telephone Encounter (Signed)
Per Ned Card NP, made pt aware that potassium is better but to continue with K Dur 20 mEq daily, as well as magnesium twice daily 400 mg magnesium. Pt verbalized understanding

## 2019-11-01 DIAGNOSIS — C250 Malignant neoplasm of head of pancreas: Secondary | ICD-10-CM | POA: Diagnosis not present

## 2019-11-05 ENCOUNTER — Other Ambulatory Visit: Payer: Self-pay

## 2019-11-05 ENCOUNTER — Inpatient Hospital Stay: Payer: BC Managed Care – PPO

## 2019-11-05 ENCOUNTER — Inpatient Hospital Stay (HOSPITAL_BASED_OUTPATIENT_CLINIC_OR_DEPARTMENT_OTHER): Payer: BC Managed Care – PPO | Admitting: Oncology

## 2019-11-05 VITALS — BP 108/81 | HR 69 | Temp 97.7°F | Resp 17 | Ht 67.8 in | Wt 201.0 lb

## 2019-11-05 DIAGNOSIS — Z79899 Other long term (current) drug therapy: Secondary | ICD-10-CM | POA: Diagnosis not present

## 2019-11-05 DIAGNOSIS — Z905 Acquired absence of kidney: Secondary | ICD-10-CM | POA: Diagnosis not present

## 2019-11-05 DIAGNOSIS — E119 Type 2 diabetes mellitus without complications: Secondary | ICD-10-CM | POA: Diagnosis not present

## 2019-11-05 DIAGNOSIS — T451X5D Adverse effect of antineoplastic and immunosuppressive drugs, subsequent encounter: Secondary | ICD-10-CM | POA: Diagnosis not present

## 2019-11-05 DIAGNOSIS — C250 Malignant neoplasm of head of pancreas: Secondary | ICD-10-CM

## 2019-11-05 DIAGNOSIS — G620 Drug-induced polyneuropathy: Secondary | ICD-10-CM | POA: Diagnosis not present

## 2019-11-05 DIAGNOSIS — R918 Other nonspecific abnormal finding of lung field: Secondary | ICD-10-CM | POA: Diagnosis not present

## 2019-11-05 DIAGNOSIS — D3A8 Other benign neuroendocrine tumors: Secondary | ICD-10-CM | POA: Diagnosis not present

## 2019-11-05 DIAGNOSIS — Z85528 Personal history of other malignant neoplasm of kidney: Secondary | ICD-10-CM | POA: Diagnosis not present

## 2019-11-05 DIAGNOSIS — Z95828 Presence of other vascular implants and grafts: Secondary | ICD-10-CM

## 2019-11-05 DIAGNOSIS — Z9221 Personal history of antineoplastic chemotherapy: Secondary | ICD-10-CM | POA: Diagnosis not present

## 2019-11-05 DIAGNOSIS — E876 Hypokalemia: Secondary | ICD-10-CM | POA: Diagnosis not present

## 2019-11-05 DIAGNOSIS — Z923 Personal history of irradiation: Secondary | ICD-10-CM | POA: Diagnosis not present

## 2019-11-05 DIAGNOSIS — I10 Essential (primary) hypertension: Secondary | ICD-10-CM | POA: Diagnosis not present

## 2019-11-05 LAB — BASIC METABOLIC PANEL - CANCER CENTER ONLY
Anion gap: 10 (ref 5–15)
BUN: 13 mg/dL (ref 6–20)
CO2: 26 mmol/L (ref 22–32)
Calcium: 9.6 mg/dL (ref 8.9–10.3)
Chloride: 104 mmol/L (ref 98–111)
Creatinine: 0.88 mg/dL (ref 0.44–1.00)
GFR, Est AFR Am: >60 mL/min (ref >60)
GFR, Estimated: >60 mL/min (ref >60)
Glucose, Bld: 178 mg/dL — ABNORMAL HIGH (ref 70–99)
Potassium: 3.3 mmol/L — ABNORMAL LOW (ref 3.5–5.1)
Sodium: 140 mmol/L (ref 135–145)

## 2019-11-05 MED ORDER — SODIUM CHLORIDE 0.9% FLUSH
10.0000 mL | Freq: Once | INTRAVENOUS | Status: AC
  Administered 2019-11-05: 10 mL
  Filled 2019-11-05: qty 10

## 2019-11-05 MED ORDER — HEPARIN SOD (PORK) LOCK FLUSH 100 UNIT/ML IV SOLN
500.0000 [IU] | Freq: Once | INTRAVENOUS | Status: AC
  Administered 2019-11-05: 500 [IU]
  Filled 2019-11-05: qty 5

## 2019-11-05 NOTE — Progress Notes (Signed)
[place] OFFICE PROGRESS NOTE   Diagnosis: Pancreas cancer  INTERVAL HISTORY:   Alyssa Ross completed Xeloda and radiation on 10/20/2019.  She reports tolerating the treatment well.  She has mild neuropathy symptoms in the feet greater than the hands.  No abdominal pain.  No diarrhea.  Good appetite.  She is working.  Objective:  Vital signs in last 24 hours:  Blood pressure 108/81, pulse 69, temperature 97.7 F (36.5 C), temperature source Temporal, resp. rate 17, height 5' 7.8" (1.722 m), weight 201 lb (91.2 kg), last menstrual period 09/17/2012, SpO2 100 %.    Resp: Lungs clear bilaterally Cardio: Regular rate and rhythm GI: No hepatosplenomegaly, no mass, nontender Vascular: No leg edema  Skin: Mild hyperpigmentation of the hands  Portacath/PICC-without erythema  Lab Results:  Lab Results  Component Value Date   WBC 4.1 10/18/2019   HGB 10.6 (L) 10/18/2019   HCT 32.1 (L) 10/18/2019   MCV 91.5 10/18/2019   PLT 283 10/18/2019   NEUTROABS 3.1 10/18/2019    CMP  Lab Results  Component Value Date   NA 140 11/05/2019   K 3.3 (L) 11/05/2019   CL 104 11/05/2019   CO2 26 11/05/2019   GLUCOSE 178 (H) 11/05/2019   BUN 13 11/05/2019   CREATININE 0.88 11/05/2019   CALCIUM 9.6 11/05/2019   PROT 7.4 10/18/2019   ALBUMIN 3.8 10/18/2019   AST 54 (H) 10/18/2019   ALT 59 (H) 10/18/2019   ALKPHOS 93 10/18/2019   BILITOT 0.4 10/18/2019   GFRNONAA >60 11/05/2019   GFRAA >60 11/05/2019    Medications: I have reviewed the patient's current medications.   Assessment/Plan: 1. 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 Tumor invades the duodenal wall and vascular groove, resection margins negative, 4/34 lymph nodes positive ? MSI-stable, tumor showed instability in 2 loci as did adjacent normal tissue ? 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 CTs 01/29/2019-ill-defined  pancreas head mass, 5 pulmonary nodules-1 with a small amount of central cavitation, tumor abuts the left margin of the portal vein indistinct density surrounding, hepatic artery, complex cystic lesion of the right kidney, right adrenal mass-characterized as an adenoma on a [facility] MRI 12/21/2018 ? 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 Elevated preoperative CA 19-9 ? CT chest 04/16/2019-multiple bilateral pulmonary nodules, some with increased cavitation, stable in size ? Cycle 1 FOLFIRINOX 04/27/2019 ? Cycle 2 FOLFIRINOX 05/11/2019 ? Cycle 3 FOLFIRINOX 05/23/2019 ? Cycle 4 FOLFIRINOX 06/08/2019 ? Cycle 5 FOLFIRINOX 06/22/2019 ? CT chest 07/02/2019-stable size of bilateral pulmonary nodules. Dominant cavitary lesions in both lungs show increased cavitation with thinner walls. Stable 2.1 cm right adrenal nodule. ? Cycle 6 FOLFIRINOX 07/06/2019 ? Cycle 7 FOLFIRINOX 07/21/2019 ? Cycle 8 FOLFIRINOX 08/03/2019, oxaliplatin deleted secondary to neuropathy ? CT chest 08/24/2019-decreased size of several lung nodules with resolution of a left upper lobe nodule, no new nodules ? Radiation to the pancreas surgical area with concurrent Xeloda 09/13/2019-10/20/2019   2. 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 Diabetes 4. Hypertension 5. Family history of pancreas cancer, INVITAE panel-VUS in the TERT 6. Port-A-Cath placement, Dr. 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 completed Xeloda and radiation on 10/20/2019.  She tolerated the treatment well.  There is no clinical evidence of disease progression.  She will be scheduled for restaging CTs and  an office visit in approximately 1 month.  [name], MD  11/05/2019  11:42 AM

## 2019-11-10 ENCOUNTER — Other Ambulatory Visit: Payer: Self-pay | Admitting: Endocrinology

## 2019-11-22 ENCOUNTER — Encounter: Payer: Self-pay | Admitting: Internal Medicine

## 2019-11-29 ENCOUNTER — Other Ambulatory Visit: Payer: BC Managed Care – PPO

## 2019-11-29 ENCOUNTER — Inpatient Hospital Stay: Payer: BC Managed Care – PPO | Attending: Genetic Counselor

## 2019-11-29 ENCOUNTER — Ambulatory Visit (HOSPITAL_COMMUNITY)
Admission: RE | Admit: 2019-11-29 | Discharge: 2019-11-29 | Disposition: A | Discharge status: Home / Self Care | Payer: BC Managed Care – PPO | Source: Ambulatory Visit | Attending: Oncology | Admitting: Oncology

## 2019-11-29 ENCOUNTER — Encounter (HOSPITAL_COMMUNITY): Payer: Self-pay

## 2019-11-29 ENCOUNTER — Inpatient Hospital Stay: Payer: BC Managed Care – PPO

## 2019-11-29 ENCOUNTER — Other Ambulatory Visit: Payer: Self-pay

## 2019-11-29 DIAGNOSIS — I10 Essential (primary) hypertension: Secondary | ICD-10-CM | POA: Diagnosis not present

## 2019-11-29 DIAGNOSIS — E119 Type 2 diabetes mellitus without complications: Secondary | ICD-10-CM | POA: Diagnosis not present

## 2019-11-29 DIAGNOSIS — R918 Other nonspecific abnormal finding of lung field: Secondary | ICD-10-CM | POA: Insufficient documentation

## 2019-11-29 DIAGNOSIS — Z923 Personal history of irradiation: Secondary | ICD-10-CM | POA: Insufficient documentation

## 2019-11-29 DIAGNOSIS — C250 Malignant neoplasm of head of pancreas: Secondary | ICD-10-CM

## 2019-11-29 DIAGNOSIS — I700 Atherosclerosis of aorta: Secondary | ICD-10-CM | POA: Insufficient documentation

## 2019-11-29 DIAGNOSIS — T451X5A Adverse effect of antineoplastic and immunosuppressive drugs, initial encounter: Secondary | ICD-10-CM | POA: Insufficient documentation

## 2019-11-29 DIAGNOSIS — Z905 Acquired absence of kidney: Secondary | ICD-10-CM | POA: Diagnosis not present

## 2019-11-29 DIAGNOSIS — Z85528 Personal history of other malignant neoplasm of kidney: Secondary | ICD-10-CM | POA: Insufficient documentation

## 2019-11-29 DIAGNOSIS — Z8507 Personal history of malignant neoplasm of pancreas: Secondary | ICD-10-CM | POA: Diagnosis not present

## 2019-11-29 DIAGNOSIS — Z800 Family history of malignant neoplasm of digestive organs: Secondary | ICD-10-CM | POA: Diagnosis not present

## 2019-11-29 DIAGNOSIS — G620 Drug-induced polyneuropathy: Secondary | ICD-10-CM | POA: Insufficient documentation

## 2019-11-29 DIAGNOSIS — Z9221 Personal history of antineoplastic chemotherapy: Secondary | ICD-10-CM | POA: Insufficient documentation

## 2019-11-29 DIAGNOSIS — Z95828 Presence of other vascular implants and grafts: Secondary | ICD-10-CM

## 2019-11-29 LAB — CMP (CANCER CENTER ONLY)
ALT: 58 U/L — ABNORMAL HIGH (ref 0–44)
AST: 53 U/L — ABNORMAL HIGH (ref 15–41)
Albumin: 3.5 g/dL (ref 3.5–5.0)
Alkaline Phosphatase: 139 U/L — ABNORMAL HIGH (ref 38–126)
Anion gap: 12 (ref 5–15)
BUN: 4 mg/dL — ABNORMAL LOW (ref 6–20)
CO2: 25 mmol/L (ref 22–32)
Calcium: 9.3 mg/dL (ref 8.9–10.3)
Chloride: 108 mmol/L (ref 98–111)
Creatinine: 0.95 mg/dL (ref 0.44–1.00)
GFR, Est AFR Am: >60 mL/min (ref >60)
GFR, Estimated: >60 mL/min (ref >60)
Glucose, Bld: 108 mg/dL — ABNORMAL HIGH (ref 70–99)
Potassium: 3.4 mmol/L — ABNORMAL LOW (ref 3.5–5.1)
Sodium: 145 mmol/L (ref 135–145)
Total Bilirubin: 0.4 mg/dL (ref 0.3–1.2)
Total Protein: 6.7 g/dL (ref 6.5–8.1)

## 2019-11-29 LAB — CBC WITH DIFFERENTIAL (CANCER CENTER ONLY)
Abs Immature Granulocytes: 0.01 10*3/uL (ref 0.00–0.07)
Basophils Absolute: 0 10*3/uL (ref 0.0–0.1)
Basophils Relative: 1 %
Eosinophils Absolute: 0.1 10*3/uL (ref 0.0–0.5)
Eosinophils Relative: 2 %
HCT: 29.2 % — ABNORMAL LOW (ref 36.0–46.0)
Hemoglobin: 9.6 g/dL — ABNORMAL LOW (ref 12.0–15.0)
Immature Granulocytes: 0 %
Lymphocytes Relative: 19 %
Lymphs Abs: 0.6 10*3/uL — ABNORMAL LOW (ref 0.7–4.0)
MCH: 31.2 pg (ref 26.0–34.0)
MCHC: 32.9 g/dL (ref 30.0–36.0)
MCV: 94.8 fL (ref 80.0–100.0)
Monocytes Absolute: 0.4 10*3/uL (ref 0.1–1.0)
Monocytes Relative: 14 %
Neutro Abs: 2 10*3/uL (ref 1.7–7.7)
Neutrophils Relative %: 64 %
Platelet Count: 329 10*3/uL (ref 150–400)
RBC: 3.08 MIL/uL — ABNORMAL LOW (ref 3.87–5.11)
RDW: 13.7 % (ref 11.5–15.5)
WBC Count: 3.1 10*3/uL — ABNORMAL LOW (ref 4.0–10.5)
nRBC: 0 % (ref 0.0–0.2)

## 2019-11-29 MED ORDER — HEPARIN SOD (PORK) LOCK FLUSH 100 UNIT/ML IV SOLN
500.0000 [IU] | Freq: Once | INTRAVENOUS | Status: AC
  Administered 2019-11-29: 500 [IU]
  Filled 2019-11-29: qty 5

## 2019-11-29 MED ORDER — SODIUM CHLORIDE (PF) 0.9 % IJ SOLN
INTRAMUSCULAR | Status: AC
  Filled 2019-11-29: qty 50

## 2019-11-29 MED ORDER — IOHEXOL 300 MG/ML  SOLN
100.0000 mL | Freq: Once | INTRAMUSCULAR | Status: AC | PRN
  Administered 2019-11-29: 100 mL via INTRAVENOUS

## 2019-11-29 MED ORDER — SODIUM CHLORIDE 0.9% FLUSH
10.0000 mL | Freq: Once | INTRAVENOUS | Status: AC
  Administered 2019-11-29: 10 mL
  Filled 2019-11-29: qty 10

## 2019-11-30 ENCOUNTER — Other Ambulatory Visit: Payer: Self-pay

## 2019-11-30 ENCOUNTER — Encounter: Payer: Self-pay | Admitting: Nurse Practitioner

## 2019-11-30 ENCOUNTER — Telehealth: Payer: Self-pay | Admitting: Emergency Medicine

## 2019-11-30 ENCOUNTER — Inpatient Hospital Stay (HOSPITAL_BASED_OUTPATIENT_CLINIC_OR_DEPARTMENT_OTHER): Payer: BC Managed Care – PPO | Admitting: Nurse Practitioner

## 2019-11-30 VITALS — BP 140/103 | HR 67 | Temp 97.5°F | Resp 16 | Ht 67.8 in | Wt 207.0 lb

## 2019-11-30 DIAGNOSIS — Z9221 Personal history of antineoplastic chemotherapy: Secondary | ICD-10-CM | POA: Diagnosis not present

## 2019-11-30 DIAGNOSIS — R918 Other nonspecific abnormal finding of lung field: Secondary | ICD-10-CM | POA: Diagnosis not present

## 2019-11-30 DIAGNOSIS — Z800 Family history of malignant neoplasm of digestive organs: Secondary | ICD-10-CM | POA: Diagnosis not present

## 2019-11-30 DIAGNOSIS — Z85528 Personal history of other malignant neoplasm of kidney: Secondary | ICD-10-CM | POA: Diagnosis not present

## 2019-11-30 DIAGNOSIS — C250 Malignant neoplasm of head of pancreas: Secondary | ICD-10-CM | POA: Diagnosis not present

## 2019-11-30 DIAGNOSIS — I10 Essential (primary) hypertension: Secondary | ICD-10-CM | POA: Diagnosis not present

## 2019-11-30 DIAGNOSIS — I700 Atherosclerosis of aorta: Secondary | ICD-10-CM | POA: Diagnosis not present

## 2019-11-30 DIAGNOSIS — Z923 Personal history of irradiation: Secondary | ICD-10-CM | POA: Diagnosis not present

## 2019-11-30 DIAGNOSIS — G620 Drug-induced polyneuropathy: Secondary | ICD-10-CM | POA: Diagnosis not present

## 2019-11-30 DIAGNOSIS — T451X5A Adverse effect of antineoplastic and immunosuppressive drugs, initial encounter: Secondary | ICD-10-CM | POA: Diagnosis not present

## 2019-11-30 DIAGNOSIS — E119 Type 2 diabetes mellitus without complications: Secondary | ICD-10-CM | POA: Diagnosis not present

## 2019-11-30 DIAGNOSIS — Z8507 Personal history of malignant neoplasm of pancreas: Secondary | ICD-10-CM | POA: Diagnosis not present

## 2019-11-30 DIAGNOSIS — Z905 Acquired absence of kidney: Secondary | ICD-10-CM | POA: Diagnosis not present

## 2019-11-30 LAB — CANCER ANTIGEN 19-9: CA 19-9: 10 U/mL (ref 0–35)

## 2019-11-30 NOTE — Telephone Encounter (Signed)
Pt called stating she went to the ER on Sunday for her continuous constipation. We instructed her to try enemas on Friday and pt had no relief. ER sent her home with new regimen. Pt still has had no relief and rates pain 10/10. Per Sandi Mealy PA, pt needs to be reevaluated by ED. Instructed pt to go now and she verbalized understanding.

## 2019-11-30 NOTE — Progress Notes (Addendum)
[place] OFFICE PROGRESS NOTE   Diagnosis: Pancreas cancer  INTERVAL HISTORY:   Alyssa Ross returns as scheduled.  She continues to have intermittent frequency/urgency with bowel movements.  No bleeding.  No nausea or vomiting.  No abdominal pain.  No fever, cough, shortness of breath.  Objective:  Vital signs in last 24 hours:  Blood pressure (!) 140/103, pulse 67, temperature (!) 97.5 F (36.4 C), temperature source Temporal, resp. rate 16, height 5' 7.8" (1.722 m), weight 207 lb (93.9 kg), last menstrual period 09/17/2012, SpO2 100 %.    HEENT: No thrush or ulcers. Resp: Lungs clear bilaterally. Cardio: Regular rate and rhythm. GI: Abdomen soft and nontender.  No hepatomegaly. Vascular: No leg edema.  Calves soft and nontender. Port-A-Cath without erythema.  Lab Results:  Lab Results  Component Value Date   WBC 3.1 (L) 11/29/2019   HGB 9.6 (L) 11/29/2019   HCT 29.2 (L) 11/29/2019   MCV 94.8 11/29/2019   PLT 329 11/29/2019   NEUTROABS 2.0 11/29/2019    Imaging:  CT CHEST W CONTRAST  Result Date: 11/29/2019 CLINICAL DATA:  54 year old female with history of pancreatic cancer status post Whipple procedure. EXAM: CT CHEST, ABDOMEN, AND PELVIS WITH CONTRAST TECHNIQUE: Multidetector CT imaging of the chest, abdomen and pelvis was performed following the standard protocol during bolus administration of intravenous contrast. CONTRAST:  166m OMNIPAQUE IOHEXOL 300 MG/ML  SOLN COMPARISON:  Chest CT 08/24/2019. PET-CT 03/03/2019. CT the chest, abdomen and pelvis 01/29/2019. FINDINGS: CT CHEST FINDINGS Cardiovascular: Heart size is normal. There is no significant pericardial fluid, thickening or pericardial calcification. No atherosclerotic calcifications in the thoracic aorta or the coronary arteries. Left subclavian single-lumen porta cath with tip terminating at the superior cavoatrial junction. Mediastinum/Nodes: No pathologically enlarged mediastinal or hilar  lymph nodes. Esophagus is unremarkable in appearance. No axillary lymphadenopathy. Lungs/Pleura: There are again multiple small pulmonary nodules scattered throughout the lungs bilaterally, which appear increased in number and size. The largest of these nodules have internal cavitation measuring up to 8 x 7 mm in the inferior aspect of the right upper lobe (axial image 62 of series 12) and 8 x 7 mm in the superior segment of the left lower lobe (axial image 57 of series 12). The largest solid nodule is in the apex of the left upper lobe measuring 7 x 5 mm (axial image 18 of series 12). New nodules are also noted, exemplified by a 4 mm left upper lobe nodule (axial image 28 of series 12). No acute consolidative airspace disease. No pleural effusions. Musculoskeletal: There are no aggressive appearing lytic or blastic lesions noted in the visualized portions of the skeleton. CT ABDOMEN PELVIS FINDINGS Hepatobiliary: No suspicious cystic or solid hepatic lesions. No intra or extrahepatic biliary ductal dilatation. Status post cholecystectomy. Pancreas: Postoperative changes of Whipple procedure. Stent in the main pancreatic duct. No pancreatic mass. No pancreatic ductal dilatation. No peripancreatic fluid collections or inflammatory changes. Spleen: Unremarkable. Adrenals/Urinary Tract: Cortical thinning in the anterior aspect of the upper pole of the right kidney, likely sequela of prior infection. Left kidney and left adrenal gland are normal in appearance. 2.3 x 1.3 cm right adrenal nodule, similar to the prior study. No hydroureteronephrosis. Urinary bladder is normal in appearance. Stomach/Bowel: Postoperative changes of Whipple procedure. No pathologic dilatation of small bowel or colon. Normal appendix. Inflammatory changes are noted adjacent to the duodenum and proximal jejunum with haziness in the adjacent small bowel mesentery. Normal appendix. Vascular/Lymphatic: Aortic atherosclerosis, without evidence  of  aneurysm or dissection in the abdominal or pelvic vasculature. No lymphadenopathy noted in the abdomen or pelvis. Reproductive: Uterus is enlarged and very heterogeneous in appearance with multiple heterogeneously enhancing lesions, largest of which is in the fundus of the uterus measuring 6.8 x 6.6 x 6.6 cm. In addition, there is an exophytic lesion extending off the posterolateral aspect of the uterine body from the left side (axial image 176 of series 7 and coronal image 57 of series 8) measuring 6.2 x 4.6 x 5.3 cm, likely to represent multifocal fibroids. Right ovary is unremarkable in appearance. Left ovary is immediately adjacent to the presumed exophytic fibroid. Other: Trace volume of free fluid in the cul-de-sac, likely physiologic. No larger volume of ascites. No pneumoperitoneum. Musculoskeletal: There are no aggressive appearing lytic or blastic lesions noted in the visualized portions of the skeleton. IMPRESSION: 1. Postoperative changes of Whipple procedure with inflammatory changes in the soft tissues adjacent to the duodenum and proximal jejunum, concerning for possible enteritis. 2. Increased size and number of numerous small pulmonary nodules scattered throughout the lungs bilaterally, concerning for progressive metastatic disease. 3. No definite evidence of metastatic disease noted in the abdomen or pelvis. 4. Markedly enlarged and heterogeneous appearing uterus, likely to represent multifocal fibroids. One of these lesions appears to be an exophytic subserosal fibroid in the posterolateral aspect of the uterine body on the left side, although this comes in close proximity to the left adnexa such that a primary ovarian lesion is difficult to completely exclude. Further evaluation with nonemergent pelvic ultrasound is recommended in the near future. 5. Additional incidental findings, as above. Electronically Signed   By: Vinnie Langton M.D.   On: 11/29/2019 11:31   CT ABDOMEN PELVIS W  CONTRAST  Result Date: 11/29/2019 CLINICAL DATA:  54 year old female with history of pancreatic cancer status post Whipple procedure. EXAM: CT CHEST, ABDOMEN, AND PELVIS WITH CONTRAST TECHNIQUE: Multidetector CT imaging of the chest, abdomen and pelvis was performed following the standard protocol during bolus administration of intravenous contrast. CONTRAST:  173m OMNIPAQUE IOHEXOL 300 MG/ML  SOLN COMPARISON:  Chest CT 08/24/2019. PET-CT 03/03/2019. CT the chest, abdomen and pelvis 01/29/2019. FINDINGS: CT CHEST FINDINGS Cardiovascular: Heart size is normal. There is no significant pericardial fluid, thickening or pericardial calcification. No atherosclerotic calcifications in the thoracic aorta or the coronary arteries. Left subclavian single-lumen porta cath with tip terminating at the superior cavoatrial junction. Mediastinum/Nodes: No pathologically enlarged mediastinal or hilar lymph nodes. Esophagus is unremarkable in appearance. No axillary lymphadenopathy. Lungs/Pleura: There are again multiple small pulmonary nodules scattered throughout the lungs bilaterally, which appear increased in number and size. The largest of these nodules have internal cavitation measuring up to 8 x 7 mm in the inferior aspect of the right upper lobe (axial image 62 of series 12) and 8 x 7 mm in the superior segment of the left lower lobe (axial image 57 of series 12). The largest solid nodule is in the apex of the left upper lobe measuring 7 x 5 mm (axial image 18 of series 12). New nodules are also noted, exemplified by a 4 mm left upper lobe nodule (axial image 28 of series 12). No acute consolidative airspace disease. No pleural effusions. Musculoskeletal: There are no aggressive appearing lytic or blastic lesions noted in the visualized portions of the skeleton. CT ABDOMEN PELVIS FINDINGS Hepatobiliary: No suspicious cystic or solid hepatic lesions. No intra or extrahepatic biliary ductal dilatation. Status post  cholecystectomy. Pancreas: Postoperative changes of Whipple  procedure. Stent in the main pancreatic duct. No pancreatic mass. No pancreatic ductal dilatation. No peripancreatic fluid collections or inflammatory changes. Spleen: Unremarkable. Adrenals/Urinary Tract: Cortical thinning in the anterior aspect of the upper pole of the right kidney, likely sequela of prior infection. Left kidney and left adrenal gland are normal in appearance. 2.3 x 1.3 cm right adrenal nodule, similar to the prior study. No hydroureteronephrosis. Urinary bladder is normal in appearance. Stomach/Bowel: Postoperative changes of Whipple procedure. No pathologic dilatation of small bowel or colon. Normal appendix. Inflammatory changes are noted adjacent to the duodenum and proximal jejunum with haziness in the adjacent small bowel mesentery. Normal appendix. Vascular/Lymphatic: Aortic atherosclerosis, without evidence of aneurysm or dissection in the abdominal or pelvic vasculature. No lymphadenopathy noted in the abdomen or pelvis. Reproductive: Uterus is enlarged and very heterogeneous in appearance with multiple heterogeneously enhancing lesions, largest of which is in the fundus of the uterus measuring 6.8 x 6.6 x 6.6 cm. In addition, there is an exophytic lesion extending off the posterolateral aspect of the uterine body from the left side (axial image 176 of series 7 and coronal image 57 of series 8) measuring 6.2 x 4.6 x 5.3 cm, likely to represent multifocal fibroids. Right ovary is unremarkable in appearance. Left ovary is immediately adjacent to the presumed exophytic fibroid. Other: Trace volume of free fluid in the cul-de-sac, likely physiologic. No larger volume of ascites. No pneumoperitoneum. Musculoskeletal: There are no aggressive appearing lytic or blastic lesions noted in the visualized portions of the skeleton. IMPRESSION: 1. Postoperative changes of Whipple procedure with inflammatory changes in the soft tissues adjacent  to the duodenum and proximal jejunum, concerning for possible enteritis. 2. Increased size and number of numerous small pulmonary nodules scattered throughout the lungs bilaterally, concerning for progressive metastatic disease. 3. No definite evidence of metastatic disease noted in the abdomen or pelvis. 4. Markedly enlarged and heterogeneous appearing uterus, likely to represent multifocal fibroids. One of these lesions appears to be an exophytic subserosal fibroid in the posterolateral aspect of the uterine body on the left side, although this comes in close proximity to the left adnexa such that a primary ovarian lesion is difficult to completely exclude. Further evaluation with nonemergent pelvic ultrasound is recommended in the near future. 5. Additional incidental findings, as above. Electronically Signed   By: Vinnie Langton M.D.   On: 11/29/2019 11:31    Medications: I have reviewed the patient's current medications.  Assessment/Plan: 1. 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 Tumor invades the duodenal wall and vascular groove, resection margins negative, 4/34 lymph nodes positive ? MSI-stable, tumor showed instability in 2 loci as did adjacent normal tissue ? 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 CTs 01/29/2019-ill-defined pancreas head mass, 5 pulmonary nodules-1 with a small amount of central cavitation, tumor abuts the left margin of the portal vein indistinct density surrounding, hepatic artery, complex cystic lesion of the right kidney, right adrenal mass-characterized as an adenoma on a [facility] MRI 12/21/2018 ? 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 Elevated preoperative CA 19-9--CA 19-9 186 on 01/18/2019 ? CT chest 04/16/2019-multiple bilateral  pulmonary nodules, some with increased cavitation, stable in size ? Cycle 1 FOLFIRINOX 04/27/2019 ? Cycle 2 FOLFIRINOX 05/11/2019 ? Cycle 3 FOLFIRINOX 05/23/2019 ? Cycle 4 FOLFIRINOX 06/08/2019 ? Cycle 5 FOLFIRINOX 06/22/2019 ? CT chest 07/02/2019-stable size of  bilateral pulmonary nodules. Dominant cavitary lesions in both lungs show increased cavitation with thinner walls. Stable 2.1 cm right adrenal nodule. ? Cycle 6 FOLFIRINOX 07/06/2019 ? Cycle 7 FOLFIRINOX 07/21/2019 ? Cycle 8 FOLFIRINOX 08/03/2019, oxaliplatin deleted secondary to neuropathy ? CT chest 08/24/2019-decreased size of several lung nodules with resolution of a left upper lobe nodule, no new nodules ? Radiation to the pancreas surgical area with concurrent Xeloda 09/13/2019-10/20/2019  ? CTs 11/29/2019-multiple small pulmonary nodules scattered throughout the lungs bilaterally, appear increased in number and size. No definite evidence of metastatic disease in the abdomen or pelvis. Markedly enlarged and heterogeneous appearing uterus, likely to represent multifocal fibroids. 1 of these lesions appears to be an exophytic subserosal fibroid in the posterior lateral aspect of the uterine body on the left side although this comes in close proximity to the left adnexa such that a primary ovarian lesion is difficult to completely exclude.  2. 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 Diabetes 4. Hypertension 5. Family history of pancreas cancer, INVITAE panel-VUS in the TERT 6. Port-A-Cath placement, Dr. 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  There is no clinical evidence of disease progression.  The recent CT scans showed several small lung nodules bilaterally, possibly increased in number and size.  We reviewed the CT report/images with Alyssa Ross in the exam room at today's visit.  The right upper lobe lesion appears slightly larger and the "new" left upper  lobe nodule was present on a previous CT scan.  Dr. Gearldine Shown recommendation is for observation with a repeat CT scan in 2 to 2-1/2 months.  Alyssa Ross agrees with this plan.  She will return for a Port-A-Cath flush in 6 weeks.  Lab, flush, CT scans in approximately 10 weeks with a follow-up appointment the next day.  She will contact the office in the interim with any problems, questions or concerns.  Patient seen with Dr. Benay Spice.   Ned Card ANP/GNP-BC   11/30/2019  10:17 AM  This was a shared visit with Ned Card.  We discussed the CT findings and reviewed the images with Alyssa Ross.  She has several lung nodules, there is a slight increase in the size of a right upper lung lesion and the "new "lesion in the left lung was present on a previous CT.  We explained the lung lesions likely represent metastases from the pancreas cancer, but other etiologies remain possible.  The lesions appear too small to biopsy.  I recommend observation with a follow-up CT in 2-2.5 months.  She is in agreement.  She does not wish to return to [facility] for another opinion at present.  Julieanne Manson, MD

## 2019-12-02 ENCOUNTER — Telehealth: Payer: Self-pay | Admitting: Nurse Practitioner

## 2019-12-02 NOTE — Telephone Encounter (Signed)
Scheduled per 6/22 los. Called and spoke with patient, confirmed 8/3, 8/30, 8/31 appts

## 2019-12-07 ENCOUNTER — Other Ambulatory Visit (INDEPENDENT_AMBULATORY_CARE_PROVIDER_SITE_OTHER): Payer: BC Managed Care – PPO

## 2019-12-07 ENCOUNTER — Other Ambulatory Visit: Payer: Self-pay

## 2019-12-07 DIAGNOSIS — E1165 Type 2 diabetes mellitus with hyperglycemia: Secondary | ICD-10-CM | POA: Diagnosis not present

## 2019-12-07 DIAGNOSIS — Z794 Long term (current) use of insulin: Secondary | ICD-10-CM | POA: Diagnosis not present

## 2019-12-07 LAB — GLUCOSE, RANDOM: Glucose, Bld: 82 mg/dL (ref 70–99)

## 2019-12-07 LAB — HEMOGLOBIN A1C: Hgb A1c MFr Bld: 7.7 % — ABNORMAL HIGH (ref 4.6–6.5)

## 2019-12-08 DIAGNOSIS — H2513 Age-related nuclear cataract, bilateral: Secondary | ICD-10-CM | POA: Diagnosis not present

## 2019-12-08 DIAGNOSIS — E119 Type 2 diabetes mellitus without complications: Secondary | ICD-10-CM | POA: Diagnosis not present

## 2019-12-08 LAB — HM DIABETES EYE EXAM

## 2019-12-10 ENCOUNTER — Telehealth (INDEPENDENT_AMBULATORY_CARE_PROVIDER_SITE_OTHER): Payer: BC Managed Care – PPO | Admitting: Endocrinology

## 2019-12-10 ENCOUNTER — Encounter: Payer: Self-pay | Admitting: Endocrinology

## 2019-12-10 DIAGNOSIS — Z794 Long term (current) use of insulin: Secondary | ICD-10-CM

## 2019-12-10 DIAGNOSIS — E1165 Type 2 diabetes mellitus with hyperglycemia: Secondary | ICD-10-CM

## 2019-12-10 DIAGNOSIS — I10 Essential (primary) hypertension: Secondary | ICD-10-CM

## 2019-12-10 NOTE — Progress Notes (Signed)
Patient ID: Alyssa Ross, female   DOB: April 14, 1966, 54 y.o.   MRN: [id number]  I connected with the above-named patient by video enabled telemedicine application and verified that I am speaking with the correct person. The patient was explained the limitations of evaluation and management by telemedicine and the availability of in person appointments.  Patient also understood that there may be a patient responsible charge related to this service . Location of the patient: Patient's home . Location of the provider: Physician office Only the patient and myself were participating in the encounter The patient understood the above statements and agreed to proceed.   Reason for Appointment: Diabetes follow-up    History of Present Illness   Diagnosis: Type 2 DIABETES MELITUS, date of diagnosis 2005   She had previously been on metformin and Amaryl Because of inadequate control she was given Victoza in addition in 2011 With this her blood sugars have been significantly better and her A1c has been as low as 6.2 in 2013 Her blood sugars have been more difficult to control since 2014 an A1c consistently over 7% She  was started on insulin with Levemir in 06/2013 because of  increase in her A1c to 8.5% She had previously been taking Victoza along with Amaryl and metformin without consistent control Also had difficulty losing weight despite taking 1.8 mg Victoza, also had tried the 3 mg dose Because of episodic nausea and diarrhea in 1/16 she went off her Victoza  RECENT history:   INSULIN dose: TRESIBA 14 units pm.  Humalog mix 75/25, 24 units at breakfast usually  Non-insulin hypoglycemic drugs: on metformin 2000 mg,  A1c 7.7, previously was  8.6   Current management, blood sugars and problems identified as follows  She has now taking Humalog mix and is taking it mostly at breakfast  However if she has low carbohydrate in the morning she will take it at lunchtime  However not  taking insulin to cover her evening meal and highest blood sugars after dinner overall  Blood sugars are on an average about the same but she has slightly better time in target parameter compared to her last visit and generally lower blood sugars except after dinner  She again says that she did not get time to exercise in the evenings and she does not like to exercise at lunch because she thinks her sugars are low, this is not evident on her CGM  She does have symptoms when blood sugars are low on the freestyle libre tendency to hypoglycemia overnight on 2 occasions in the last week and once in the evening  HIGHEST blood sugar around 9 PM but significant variability present  She has gained back a lot of weight since February     Side effects from medications: None   Monitors blood glucose: With freestyle Libre sensor   CGM use % of time  96  2-week average/SD  166, GV 40  Time in range       56 %  % Time Above 180  26  % Time above 250  13  % Time Below 70 5     PRE-MEAL Fasting Lunch Dinner Bedtime Overall  Glucose range:       Averages:  116   186  205  166   POST-MEAL PC Breakfast PC Lunch PC Dinner  Glucose range:     Averages:  165  182  218    Previous data:  CGM use % of time  96  Average and SD  169, GV 38  Time in range        51%  % Time Above 180  35  % Time above 250  10  % Time Below target  4    PRE-MEAL Fasting Lunch Dinner Bedtime Overall  Glucose range:       Mean/median:  125  213  182   169   POST-MEAL PC Breakfast PC Lunch PC Dinner  Glucose range:     Mean/median:  189  220  194       Meals: 3 meals per day. Dinner 8-9 pm     DIET: As above         Wt Readings from Last 3 Encounters:  11/30/19 207 lb (93.9 kg)  11/05/19 201 lb (91.2 kg)  10/13/19 205 lb 8 oz (93.2 kg)    LABS:  Lab Results  Component Value Date   HGBA1C 7.7 (H) 12/07/2019   HGBA1C 8.6 (H) 07/26/2019   HGBA1C 8.3 (H) 04/02/2019   Lab Results  Component  Value Date   MICROALBUR 10 07/26/2019   LDLCALC 91 07/26/2019   CREATININE 0.95 11/29/2019    Lab on 12/07/2019  Component Date Value Ref Range Status  . Glucose, Bld 12/07/2019 82  70 - 99 mg/dL Final  . Hgb A1c MFr Bld 12/07/2019 7.7* 4.6 - 6.5 % Final   Glycemic Control Guidelines for People with Diabetes:Non Diabetic:  <6%Goal of Therapy: <7%Additional Action Suggested:  >8%     Allergies as of 12/10/2019      Reactions   Cherry Rash   Lemon Oil Rash      Medication List       Accurate as of December 10, 2019  9:07 AM. If you have any questions, ask your nurse or doctor.        baclofen 10 MG tablet Commonly known as: LIORESAL Take 1 tablet (10 mg total) by mouth 3 (three) times daily as needed for muscle spasms.   FreeStyle Libre 14 Day Reader Kerrin Mo USE READER TO CHECK BLOOD GLUCOSE WITH FREESTYLE LIBRE SENSORS.   FreeStyle Libre 14 Day Sensor Misc APPLY SENSOR TO BODY ONCE EVERY 14 DAYS TO MONITOR BLOOD GLUCOSE   HumaLOG Mix 75/25 KwikPen (75-25) 100 UNIT/ML Kwikpen Generic drug: Insulin Lispro Prot & Lispro Inject 24 units under the skin once daily before lunch.   insulin lispro 100 UNIT/ML KwikPen Commonly known as: HumaLOG KwikPen Inject 20 units under the skin three times daily before meals.   Klor-Con M20 20 MEQ tablet Generic drug: potassium chloride SA TAKE 1 TABLET DAILY   lidocaine-prilocaine cream Commonly known as: EMLA Apply 1 application topically as directed. Apply 1 hour prior to stick and cover with plastic wrap   lisinopril-hydrochlorothiazide 10-12.5 MG tablet Commonly known as: ZESTORETIC TAKE 1 TABLET DAILY   loratadine 10 MG tablet Commonly known as: CLARITIN Take 10 mg by mouth at bedtime.   MAGnesium-Oxide 400 (241.3 Mg) MG tablet Generic drug: magnesium oxide TAKE 1 TABLET(400 MG) BY MOUTH TWICE DAILY   metFORMIN 500 MG 24 hr tablet Commonly known as: GLUCOPHAGE-XR Take 4 tablets (2000mg  total) by mouth once daily.   NovoFine  Plus 32G X 4 MM Misc Generic drug: Insulin Pen Needle USE TO INJECT INSULIN THREE TIMES DAILY   ondansetron 8 MG tablet Commonly known as: ZOFRAN Take 1 tablet (8 mg total) by mouth every 8 (eight) hours as needed for nausea or vomiting. Start 72 hours after  chemo treatment   oxyCODONE 5 MG immediate release tablet Commonly known as: Oxy IR/ROXICODONE Take 1-2 tablets (5-10 mg total) by mouth every 4 (four) hours as needed for moderate pain, severe pain or breakthrough pain.   prochlorperazine 10 MG tablet Commonly known as: COMPAZINE Take 1 tablet (10 mg total) by mouth every 6 (six) hours as needed.   rosuvastatin 10 MG tablet Commonly known as: CRESTOR Take 10 mg by mouth daily.   traMADol 50 MG tablet Commonly known as: ULTRAM Take 1-2 tablets (50-100 mg total) by mouth every 6 (six) hours as needed for moderate pain or severe pain.   TRESIBA FLEXTOUCH [place] Inject 14-60 Units into the skin at bedtime.       Allergies:  Allergies  Allergen Reactions  . Cherry Rash  . Lemon Oil Rash    Past Medical History:  Diagnosis Date  . Anemia   . ASCUS (atypical squamous cells of undetermined significance) on Pap smear 08/06/1999  . Breast mass in female 2002   Left  . Cancer [facility])    Neuroendocrine Tumor of the pancreas  . Diabetes mellitus without complication ([place])   . Family history of pancreatic cancer   . Fibroid uterus 2010  . Hypertension   . Irregular bleeding 2011  . 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 PONV (postoperative nausea and vomiting)    nausea  vomitting after 12/25/18 ERCP  . 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 Yeast vaginitis 2006    Past Surgical History:  Procedure Laterality Date  . BILIARY STENT PLACEMENT N/A 12/25/2018   Procedure: BILIARY STENT PLACEMENT;  Surgeon: Carol Ada, MD;  Location: [facility];  Service: Endoscopy;  Laterality: N/A;  . BILIARY STENT PLACEMENT N/A 01/01/2019    Procedure: BILIARY STENT PLACEMENT;  Surgeon: Carol Ada, MD;  Location: [facility];  Service: Endoscopy;  Laterality: N/A;  . DILATATION & CURETTAGE/HYSTEROSCOPY WITH TRUECLEAR N/A 01/20/2014   Procedure: DILATATION & CURETTAGE/HYSTEROSCOPY WITH TRUCLEAR;  Surgeon: [name], MD;  Location: [place] [facility];  Service: Gynecology;  Laterality: N/A;  . DILATATION & CURRETTAGE/HYSTEROSCOPY WITH RESECTOCOPE N/A 01/20/2014   Procedure: [place];  Surgeon: [name], MD;  Location: [place] [facility];  Service: Gynecology;  Laterality: N/A;  . ENDOSCOPIC RETROGRADE CHOLANGIOPANCREATOGRAPHY (ERCP) WITH PROPOFOL N/A 12/25/2018   Procedure: ENDOSCOPIC RETROGRADE CHOLANGIOPANCREATOGRAPHY (ERCP) WITH PROPOFOL;  Surgeon: Carol Ada, MD;  [facility];  Service: Endoscopy;  Laterality: N/A;  . ENDOSCOPIC RETROGRADE CHOLANGIOPANCREATOGRAPHY (ERCP) WITH PROPOFOL N/A 01/01/2019   Procedure: ENDOSCOPIC RETROGRADE CHOLANGIOPANCREATOGRAPHY (ERCP) WITH PROPOFOL;  Surgeon: Carol Ada, MD;  [facility];  Service: Endoscopy;  Laterality: N/A;  . ERCP  12/25/2018  . FINE NEEDLE ASPIRATION N/A 01/01/2019   Procedure: FINE NEEDLE ASPIRATION (FNA) LINEAR;  Surgeon: Carol Ada, MD;  [facility];  Service: Endoscopy;  Laterality: N/A;  . LAPAROSCOPY N/A 03/04/2019   Procedure: LAPAROSCOPY DIAGNOSTIC;  Surgeon: Stark Klein, MD;  Location: [place];  Service: General;  Laterality: N/A;  GENERAL AND EPIDURAL  . NO PAST SURGERIES    . PARTIAL NEPHRECTOMY Right 03/04/2019   Procedure: Nephrectomy Partial;  Surgeon: Cleon Gustin, MD;  Location: [place];  Service: Urology;  Laterality: Right;  . PORTACATH PLACEMENT Left 04/21/2019   Procedure: INSERTION PORT-A-CATH;  Surgeon: Stark Klein, MD;  Location: [place];  Service: General;  Laterality: Left;  . SPHINCTEROTOMY  12/25/2018   Procedure: SPHINCTEROTOMY;  Surgeon: Carol Ada, MD;  [facility];  Service: Endoscopy;;  . STENT REMOVAL  01/01/2019   Procedure: STENT REMOVAL;  Surgeon: Carol Ada, MD;  [facility];  Service: Endoscopy;;  . UPPER ESOPHAGEAL ENDOSCOPIC ULTRASOUND (EUS) N/A 01/01/2019   Procedure: UPPER ESOPHAGEAL ENDOSCOPIC ULTRASOUND (EUS);  Surgeon: Carol Ada, MD;  Location: [facility];  Service: Endoscopy;  Laterality: N/A;  . WHIPPLE PROCEDURE N/A 03/04/2019   Procedure: WHIPPLE PROCEDURE;  Surgeon: Stark Klein, MD;  Location[facility];  Service: General;  Laterality: N/A;  GENERAL AND EPIDURAL    Family History  Problem Relation Age of Onset  . Diabetes Mother   . Dementia Mother   . Hyperlipidemia Mother   . Hypertension Mother   . Irregular heart beat Mother   . Diabetes Father   . Hypertension Father   . Hyperlipidemia Father   . Down syndrome Sister   . Diabetes Sister   . Hyperlipidemia Brother   . Heart Problems Brother   . Goiter Maternal Aunt   . Thyroid nodules Sister   . Cancer Cousin 60       eye; maternal first cousin  . Goiter Cousin   . Cancer Paternal Aunt        unknown form of cancer  . Cancer Cousin        unknown form of cancer; paternal first cousin  . Pancreatic cancer Cousin 48       paternal first cousin  . Cancer Cousin 54       unknown cancer; paternal first cousin  . Cancer Cousin 30       unknown cancer; paternal first cousin    Social History:  reports that she has never smoked. She has never used smokeless tobacco. She reports current alcohol use. She reports that she does not use drugs.  REVIEW of systems:  She is being followed for her pancreatic tumor by oncology, last CT scan shows pulmonary nodules that are increasing  She is being followed by her PCP for hypertension with lisinopril HCT 10/12 .5 mg daily She has been followed in the oncology clinic regularly, now last BP was high because of anxiety   BP Readings from Last 3 Encounters:  11/30/19 (!) 140/103  11/05/19 108/81   10/13/19 116/82     Has had hypercholesterolemia, was taking Crestor 20 mg with following results  Labs as follows  Lab Results  Component Value Date   CHOL 190 07/26/2019   HDL 75 07/26/2019   LDLCALC 91 07/26/2019   LDLDIRECT 97.7 02/11/2014   TRIG 139 07/26/2019   CHOLHDL 2.5 07/26/2019      Examination:   LMP 09/17/2012   There is no height or weight on file to calculate BMI.    Assessment/Plan:   Diabetes type 2, on insulin  See history of present illness for detailed discussion of her current management, blood sugar patterns and problems identified  Her A1c is usually over 8% and now slightly better at 7.7  Although not clear if her CGM is entirely accurate her blood sugars are somewhat better control As before she has mostly postprandial hyperglycemia, mostly after dinner but occasionally after breakfast also and likely not getting adequate mealtime coverage, currently relying only on the premixed insulin for this Explained the time course of action of 75/25 insulin, this also did not provide as consistently result from day-to-day Also unexpected low sugars overnight twice likely from the type of diet the night before   She can still do better on her diet and exercise regimen  Is continuing to take Metformin   Recommendations: She needs to check fingerstick readings periodically to compare with freestyle libre and also compare libre readings when she has lab glucose done She will use Humalog to control her postprandial readings at dinnertime when she is eating any carbohydrate or significant snack She will reduce her [place] Aas to 12 units and if she still has overnight low sugars reduce it down to 10 units Discussed the need to start regular exercise for help with weight loss, insulin sensitivity and overall wellbeing; discussed that her sugars are not low according to the last 2 weeks information and she should be able to exercise right after eating at lunchtime  or dinnertime  Make sure she has low-fat meals Currently can continue the same doses of premixed insulin and Metformin 2 g daily Discussed that she may benefit from additional Humalog separately at breakfast when she is eating a high carbohydrate meal, however likely that she will take this along with a premixed insulin dose  For hypertension recommend that she check her blood pressure regularly and report to PCP  Follow-up in 3 months with A1c  There are no Patient Instructions on file for this visit.      Elayne Snare 12/10/2019, 9:07 AM   Note: This office note was prepared with Dragon voice recognition system technology. Any transcriptional errors that result from this process are unintentional.

## 2019-12-14 ENCOUNTER — Encounter: Payer: Self-pay | Admitting: Internal Medicine

## 2020-01-07 ENCOUNTER — Other Ambulatory Visit: Payer: Self-pay | Admitting: Oncology

## 2020-01-11 ENCOUNTER — Other Ambulatory Visit: Payer: Self-pay

## 2020-01-11 ENCOUNTER — Inpatient Hospital Stay: Payer: BC Managed Care – PPO | Attending: Genetic Counselor

## 2020-01-11 DIAGNOSIS — I10 Essential (primary) hypertension: Secondary | ICD-10-CM | POA: Insufficient documentation

## 2020-01-11 DIAGNOSIS — E1140 Type 2 diabetes mellitus with diabetic neuropathy, unspecified: Secondary | ICD-10-CM | POA: Diagnosis not present

## 2020-01-11 DIAGNOSIS — Z95828 Presence of other vascular implants and grafts: Secondary | ICD-10-CM

## 2020-01-11 DIAGNOSIS — R918 Other nonspecific abnormal finding of lung field: Secondary | ICD-10-CM | POA: Insufficient documentation

## 2020-01-11 DIAGNOSIS — C259 Malignant neoplasm of pancreas, unspecified: Secondary | ICD-10-CM | POA: Diagnosis not present

## 2020-01-11 DIAGNOSIS — Z905 Acquired absence of kidney: Secondary | ICD-10-CM | POA: Insufficient documentation

## 2020-01-11 DIAGNOSIS — Z85528 Personal history of other malignant neoplasm of kidney: Secondary | ICD-10-CM | POA: Diagnosis not present

## 2020-01-11 DIAGNOSIS — Z8507 Personal history of malignant neoplasm of pancreas: Secondary | ICD-10-CM | POA: Diagnosis not present

## 2020-01-11 DIAGNOSIS — C250 Malignant neoplasm of head of pancreas: Secondary | ICD-10-CM

## 2020-01-11 MED ORDER — SODIUM CHLORIDE 0.9% FLUSH
10.0000 mL | Freq: Once | INTRAVENOUS | Status: AC
  Administered 2020-01-11: 10 mL
  Filled 2020-01-11: qty 10

## 2020-01-11 MED ORDER — HEPARIN SOD (PORK) LOCK FLUSH 100 UNIT/ML IV SOLN
500.0000 [IU] | Freq: Once | INTRAVENOUS | Status: AC
  Administered 2020-01-11: 500 [IU]
  Filled 2020-01-11: qty 5

## 2020-01-16 NOTE — Progress Notes (Signed)
  Radiation Oncology         [phone] ________________________________  Name: Alyssa Ross MRN: [id number]  Date: 10/20/2019  DOB: June 07, 1966  End of Treatment Note  Diagnosis:   pancreatic cancer     Indication for treatment::  curative       Radiation treatment dates:   09/13/10 - 10/20/19  Site/dose:   The pancreas initially was treated to a dose of 45 Gy in 25 fractions using an IMRT technique. A 5.4 Gy boost was then giveto yield a total dose of 50.4 Gy.  Narrative: The patient tolerated radiation treatment.   The patient was monitored during treatment for GI issues including nausea and diarrhea.  Plan: The patient has completed radiation treatment. The patient will return to radiation oncology clinic for routine followup in one month. I advised the patient to call or return sooner if they have any questions or concerns related to their recovery or treatment. ________________________________  Jodelle Gross, M.D., Ph.D.

## 2020-01-24 ENCOUNTER — Ambulatory Visit: Payer: BC Managed Care – PPO | Admitting: Internal Medicine

## 2020-01-25 ENCOUNTER — Ambulatory Visit: Payer: BC Managed Care – PPO | Admitting: Internal Medicine

## 2020-01-25 ENCOUNTER — Other Ambulatory Visit: Payer: Self-pay

## 2020-01-25 ENCOUNTER — Encounter: Payer: Self-pay | Admitting: Internal Medicine

## 2020-01-25 VITALS — BP 110/82 | HR 77 | Temp 98.4°F | Ht 67.8 in | Wt 203.2 lb

## 2020-01-25 DIAGNOSIS — G620 Drug-induced polyneuropathy: Secondary | ICD-10-CM

## 2020-01-25 DIAGNOSIS — T451X5A Adverse effect of antineoplastic and immunosuppressive drugs, initial encounter: Secondary | ICD-10-CM | POA: Diagnosis not present

## 2020-01-25 DIAGNOSIS — Z794 Long term (current) use of insulin: Secondary | ICD-10-CM

## 2020-01-25 DIAGNOSIS — I10 Essential (primary) hypertension: Secondary | ICD-10-CM

## 2020-01-25 DIAGNOSIS — E6609 Other obesity due to excess calories: Secondary | ICD-10-CM | POA: Diagnosis not present

## 2020-01-25 DIAGNOSIS — Z1211 Encounter for screening for malignant neoplasm of colon: Secondary | ICD-10-CM

## 2020-01-25 DIAGNOSIS — Z6831 Body mass index (BMI) 31.0-31.9, adult: Secondary | ICD-10-CM

## 2020-01-25 DIAGNOSIS — E1165 Type 2 diabetes mellitus with hyperglycemia: Secondary | ICD-10-CM

## 2020-01-25 NOTE — Progress Notes (Signed)
I,Katawbba Wiggins,acting as a Education administrator for Maximino Greenland, MD.,have documented all relevant documentation on the behalf of Maximino Greenland, MD,as directed by  Maximino Greenland, MD while in the presence of Maximino Greenland, MD.  This visit occurred during the SARS-CoV-2 public health emergency.  Safety protocols were in place, including screening questions prior to the visit, additional usage of staff PPE, and extensive cleaning of exam room while observing appropriate contact time as indicated for disinfecting solutions.  Subjective:     Patient ID: Alyssa Ross , female    DOB: September 12, 1965 , 54 y.o.   MRN: [id number]   Chief Complaint  Patient presents with  . Hypertension    HPI  She is here today for a follow-up on her blood pressure.  Hypertension     Past Medical History:  Diagnosis Date  . Anemia   . ASCUS (atypical squamous cells of undetermined significance) on Pap smear 08/06/1999  . Breast mass in female 2002   Left  . Cancer [facility])    Neuroendocrine Tumor of the pancreas  . Diabetes mellitus without complication ([place])   . Family history of pancreatic cancer   . Fibroid uterus 2010  . Hypertension   . Irregular bleeding 2011  . 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 PONV (postoperative nausea and vomiting)    nausea  vomitting after 12/25/18 ERCP  . 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 Yeast vaginitis 2006     Family History  Problem Relation Age of Onset  . Diabetes Mother   . Dementia Mother   . Hyperlipidemia Mother   . Hypertension Mother   . Irregular heart beat Mother   . Diabetes Father   . Hypertension Father   . Hyperlipidemia Father   . Down syndrome Sister   . Diabetes Sister   . Hyperlipidemia Brother   . Heart Problems Brother   . Goiter Maternal Aunt   . Thyroid nodules Sister   . Cancer Cousin 60       eye; maternal first cousin  . Goiter Cousin   . Cancer Paternal Aunt        unknown form of  cancer  . Cancer Cousin        unknown form of cancer; paternal first cousin  . Pancreatic cancer Cousin 83       paternal first cousin  . Cancer Cousin 101       unknown cancer; paternal first cousin  . Cancer Cousin 81       unknown cancer; paternal first cousin     Current Outpatient Medications:  .  Continuous Blood Gluc Receiver (FREESTYLE LIBRE 14 DAY READER) DEVI, USE READER TO CHECK BLOOD GLUCOSE WITH FREESTYLE LIBRE SENSORS., Disp: 1 Device, Rfl: 0 .  Continuous Blood Gluc Sensor (FREESTYLE LIBRE 14 DAY SENSOR) MISC, APPLY SENSOR TO BODY ONCE EVERY 14 DAYS TO MONITOR BLOOD GLUCOSE, Disp: 4 each, Rfl: 3 .  HUMALOG MIX 75/25 KWIKPEN (75-25) 100 UNIT/ML Kwikpen, Inject 24 units under the skin once daily before lunch. (Patient taking differently: 20 Units. Inject 24 units under the skin once daily before lunch.), Disp: 30 mL, Rfl: 2 .  Insulin Degludec (TRESIBA FLEXTOUCH [place]), Inject 12 Units into the skin at bedtime. , Disp: , Rfl:  .  insulin lispro (HUMALOG KWIKPEN) 100 UNIT/ML KwikPen, Inject 20 units under the skin three times daily before meals. (Patient taking differently: Inject 20 units under the skin three times daily before meals. Sliding  scale), Disp: 60 pen, Rfl: 3 .  KLOR-CON M20 20 MEQ tablet, TAKE 1 TABLET DAILY, Disp: 90 tablet, Rfl: 3 .  lidocaine-prilocaine (EMLA) cream, Apply 1 application topically as directed. Apply 1 hour prior to stick and cover with plastic wrap, Disp: 30 g, Rfl: 5 .  lisinopril-hydrochlorothiazide (ZESTORETIC) 10-12.5 MG tablet, TAKE 1 TABLET DAILY, Disp: 90 tablet, Rfl: 3 .  loratadine (CLARITIN) 10 MG tablet, Take 10 mg by mouth at bedtime. , Disp: , Rfl:  .  MAGNESIUM-OXIDE 400 (241.3 Mg) MG tablet, TAKE 1 TABLET(400 MG) BY MOUTH TWICE DAILY, Disp: 60 tablet, Rfl: 2 .  metFORMIN (GLUCOPHAGE-XR) 500 MG 24 hr tablet, Take 4 tablets (2000mg  total) by mouth once daily., Disp: 360 tablet, Rfl: 1 .  NOVOFINE PLUS 32G X 4 MM MISC, USE TO INJECT  INSULIN THREE TIMES DAILY, Disp: 300 each, Rfl: 1 .  rosuvastatin (CRESTOR) 10 MG tablet, Take 10 mg by mouth daily., Disp: , Rfl:  .  baclofen (LIORESAL) 10 MG tablet, Take 1 tablet (10 mg total) by mouth 3 (three) times daily as needed for muscle spasms. (Patient not taking: Reported on 01/25/2020), Disp: 30 each, Rfl: 0 .  ondansetron (ZOFRAN) 8 MG tablet, Take 1 tablet (8 mg total) by mouth every 8 (eight) hours as needed for nausea or vomiting. Start 72 hours after chemo treatment (Patient not taking: Reported on 01/25/2020), Disp: 20 tablet, Rfl: 0 .  oxyCODONE (OXY IR/ROXICODONE) 5 MG immediate release tablet, Take 1-2 tablets (5-10 mg total) by mouth every 4 (four) hours as needed for moderate pain, severe pain or breakthrough pain. (Patient not taking: Reported on 01/25/2020), Disp: 30 tablet, Rfl: 0 .  prochlorperazine (COMPAZINE) 10 MG tablet, Take 1 tablet (10 mg total) by mouth every 6 (six) hours as needed. (Patient not taking: Reported on 01/25/2020), Disp: 60 tablet, Rfl: 1 .  traMADol (ULTRAM) 50 MG tablet, Take 1-2 tablets (50-100 mg total) by mouth every 6 (six) hours as needed for moderate pain or severe pain. (Patient not taking: Reported on 01/25/2020), Disp: 30 tablet, Rfl: 0 No current facility-administered medications for this visit.  Facility-Administered Medications Ordered in Other Visits:  .  sodium chloride flush (NS) 0.9 % injection 10 mL, 10 mL, Intracatheter, PRN, Ladell Pier, MD, 10 mL at 05/27/19 1342   Allergies  Allergen Reactions  . Cherry Rash  . Lemon Oil Rash     Review of Systems  Constitutional: Negative.   Respiratory: Negative.   Cardiovascular: Negative.   Gastrointestinal: Negative.   Psychiatric/Behavioral: Negative.   All other systems reviewed and are negative.    Today's Vitals   01/25/20 1035  BP: 110/82  Pulse: 77  Temp: 98.4 F (36.9 C)  TempSrc: Oral  Weight: 203 lb 3.2 oz (92.2 kg)  Height: 5' 7.8" (1.722 m)   Body mass  index is 31.08 kg/m.  Wt Readings from Last 3 Encounters:  01/25/20 203 lb 3.2 oz (92.2 kg)  11/30/19 207 lb (93.9 kg)  11/05/19 201 lb (91.2 kg)   Objective:  Physical Exam Vitals and nursing note reviewed.  Constitutional:      Appearance: Normal appearance. She is obese.  HENT:     Head: Normocephalic and atraumatic.  Cardiovascular:     Rate and Rhythm: Normal rate and regular rhythm.     Heart sounds: Normal heart sounds.  Pulmonary:     Breath sounds: Normal breath sounds.  Skin:    General: Skin is warm.  Neurological:  General: No focal deficit present.     Mental Status: She is alert and oriented to person, place, and time.         Assessment And Plan:     1. Essential hypertension Comments: Chronic, well controlled. She will continue with current meds. Encouraged to limit her salt intake.  - Lipid panel  2. Uncontrolled type 2 diabetes mellitus with hyperglycemia, with long-term current use of insulin (HCC) Comments: Chronic, also followed by Endocrinology. Previous a1c results reviewed in detail during her visit.  - Lipid panel  3. Chemotherapy-induced neuropathy ([place]) Comments: Does not wish to take rx meds at this time. I will continue to follow her sx and consider gabapentin in the future should her sx worsen.  - SARS-CoV-2 Semi-Quantitative Total Antibody, Spike  4. Class 1 obesity due to excess calories with body mass index (BMI) of 31.0 to 31.9 in adult, unspecified whether serious comorbidity present Comments: She is encouraged to strive for BMi less than 29 to decrease cardiac risk.   5. Screen for colon cancer Comments: She agrees to GI referral for CRC screening.  - Ambulatory referral to Gastroenterology   Patient was given opportunity to ask questions. Patient verbalized understanding of the plan and was able to repeat key elements of the plan. All questions were answered to their satisfaction.  Maximino Greenland, MD   I, Maximino Greenland,  MD, have reviewed all documentation for this visit. The documentation on 01/30/20 for the exam, diagnosis, procedures, and orders are all accurate and complete.  THE PATIENT IS ENCOURAGED TO PRACTICE SOCIAL DISTANCING DUE TO THE COVID-19 PANDEMIC.

## 2020-01-25 NOTE — Patient Instructions (Addendum)
Dr. Darleene Cleaver  Hypertension, Adult Hypertension is another name for high blood pressure. High blood pressure forces your heart to work harder to pump blood. This can cause problems over time. There are two numbers in a blood pressure reading. There is a top number (systolic) over a bottom number (diastolic). It is best to have a blood pressure that is below 120/80. Healthy choices can help lower your blood pressure, or you may need medicine to help lower it. What are the causes? The cause of this condition is not known. Some conditions may be related to high blood pressure. What increases the risk?  Smoking.  Having type 2 diabetes mellitus, high cholesterol, or both.  Not getting enough exercise or physical activity.  Being overweight.  Having too much fat, sugar, calories, or salt (sodium) in your diet.  Drinking too much alcohol.  Having long-term (chronic) kidney disease.  Having a family history of high blood pressure.  Age. Risk increases with age.  Race. You may be at higher risk if you are African American.  Gender. Men are at higher risk than women before age 79. After age 7, women are at higher risk than men.  Having obstructive sleep apnea.  Stress. What are the signs or symptoms?  High blood pressure may not cause symptoms. Very high blood pressure (hypertensive crisis) may cause: ? Headache. ? Feelings of worry or nervousness (anxiety). ? Shortness of breath. ? Nosebleed. ? A feeling of being sick to your stomach (nausea). ? Throwing up (vomiting). ? Changes in how you see. ? Very bad chest pain. ? Seizures. How is this treated?  This condition is treated by making healthy lifestyle changes, such as: ? Eating healthy foods. ? Exercising more. ? Drinking less alcohol.  Your health care provider may prescribe medicine if lifestyle changes are not enough to get your blood pressure under control, and if: ? Your top number is above 130. ? Your bottom  number is above 80.  Your personal target blood pressure may vary. Follow these instructions at home: Eating and drinking   If told, follow the DASH eating plan. To follow this plan: ? Fill one half of your plate at each meal with fruits and vegetables. ? Fill one fourth of your plate at each meal with whole grains. Whole grains include whole-wheat pasta, brown rice, and whole-grain bread. ? Eat or drink low-fat dairy products, such as skim milk or low-fat yogurt. ? Fill one fourth of your plate at each meal with low-fat (lean) proteins. Low-fat proteins include fish, chicken without skin, eggs, beans, and tofu. ? Avoid fatty meat, cured and processed meat, or chicken with skin. ? Avoid pre-made or processed food.  Eat less than 1,500 mg of salt each day.  Do not drink alcohol if: ? Your doctor tells you not to drink. ? You are pregnant, may be pregnant, or are planning to become pregnant.  If you drink alcohol: ? Limit how much you use to:  0-1 drink a day for women.  0-2 drinks a day for men. ? Be aware of how much alcohol is in your drink. In the [place] one drink equals one 12 oz bottle of beer (355 mL), one 5 oz glass of wine (148 mL), or one 1 oz glass of hard liquor (44 mL). Lifestyle   Work with your doctor to stay at a healthy weight or to lose weight. Ask your doctor what the best weight is for you.  Get at least 30 minutes  of exercise most days of the week. This may include walking, swimming, or biking.  Get at least 30 minutes of exercise that strengthens your muscles (resistance exercise) at least 3 days a week. This may include lifting weights or doing Pilates.  Do not use any products that contain nicotine or tobacco, such as cigarettes, e-cigarettes, and chewing tobacco. If you need help quitting, ask your doctor.  Check your blood pressure at home as told by your doctor.  Keep all follow-up visits as told by your doctor. This is important. Medicines  Take  over-the-counter and prescription medicines only as told by your doctor. Follow directions carefully.  Do not skip doses of blood pressure medicine. The medicine does not work as well if you skip doses. Skipping doses also puts you at risk for problems.  Ask your doctor about side effects or reactions to medicines that you should watch for. Contact a doctor if you:  Think you are having a reaction to the medicine you are taking.  Have headaches that keep coming back (recurring).  Feel dizzy.  Have swelling in your ankles.  Have trouble with your vision. Get help right away if you:  Get a very bad headache.  Start to feel mixed up (confused).  Feel weak or numb.  Feel faint.  Have very bad pain in your: ? Chest. ? Belly (abdomen).  Throw up more than once.  Have trouble breathing. Summary  Hypertension is another name for high blood pressure.  High blood pressure forces your heart to work harder to pump blood.  For most people, a normal blood pressure is less than 120/80.  Making healthy choices can help lower blood pressure. If your blood pressure does not get lower with healthy choices, you may need to take medicine. This information is not intended to replace advice given to you by your health care provider. Make sure you discuss any questions you have with your health care provider. Document Revised: 02/04/2018 Document Reviewed: 02/04/2018 Elsevier Patient Education  2020 [employer].

## 2020-01-26 LAB — LIPID PANEL
Chol/HDL Ratio: 3.1 ratio (ref 0.0–4.4)
Cholesterol, Total: 131 mg/dL (ref 100–199)
HDL: 42 mg/dL (ref >39)
LDL Chol Calc (NIH): 68 mg/dL (ref 0–99)
Triglycerides: 114 mg/dL (ref 0–149)
VLDL Cholesterol Cal: 21 mg/dL (ref 5–40)

## 2020-01-26 LAB — SARS-COV-2 SEMI-QUANTITATIVE TOTAL ANTIBODY, SPIKE
SARS-CoV-2 Semi-Quant Total Ab: >2500 U/mL (ref <0.8)
SARS-CoV-2 Spike Ab Interp: POSITIVE

## 2020-02-07 ENCOUNTER — Inpatient Hospital Stay: Payer: BC Managed Care – PPO

## 2020-02-07 ENCOUNTER — Other Ambulatory Visit: Payer: Self-pay

## 2020-02-07 ENCOUNTER — Ambulatory Visit (HOSPITAL_COMMUNITY)
Admission: RE | Admit: 2020-02-07 | Discharge: 2020-02-07 | Disposition: A | Discharge status: Home / Self Care | Payer: BC Managed Care – PPO | Source: Ambulatory Visit | Attending: Nurse Practitioner | Admitting: Nurse Practitioner

## 2020-02-07 DIAGNOSIS — I10 Essential (primary) hypertension: Secondary | ICD-10-CM | POA: Diagnosis not present

## 2020-02-07 DIAGNOSIS — C259 Malignant neoplasm of pancreas, unspecified: Secondary | ICD-10-CM | POA: Diagnosis not present

## 2020-02-07 DIAGNOSIS — R918 Other nonspecific abnormal finding of lung field: Secondary | ICD-10-CM | POA: Diagnosis not present

## 2020-02-07 DIAGNOSIS — R932 Abnormal findings on diagnostic imaging of liver and biliary tract: Secondary | ICD-10-CM | POA: Diagnosis not present

## 2020-02-07 DIAGNOSIS — Z905 Acquired absence of kidney: Secondary | ICD-10-CM | POA: Diagnosis not present

## 2020-02-07 DIAGNOSIS — E1140 Type 2 diabetes mellitus with diabetic neuropathy, unspecified: Secondary | ICD-10-CM | POA: Diagnosis not present

## 2020-02-07 DIAGNOSIS — Z95828 Presence of other vascular implants and grafts: Secondary | ICD-10-CM

## 2020-02-07 DIAGNOSIS — Z85528 Personal history of other malignant neoplasm of kidney: Secondary | ICD-10-CM | POA: Diagnosis not present

## 2020-02-07 DIAGNOSIS — K760 Fatty (change of) liver, not elsewhere classified: Secondary | ICD-10-CM | POA: Diagnosis not present

## 2020-02-07 DIAGNOSIS — C250 Malignant neoplasm of head of pancreas: Secondary | ICD-10-CM | POA: Diagnosis not present

## 2020-02-07 DIAGNOSIS — Z8507 Personal history of malignant neoplasm of pancreas: Secondary | ICD-10-CM | POA: Diagnosis not present

## 2020-02-07 LAB — CBC WITH DIFFERENTIAL (CANCER CENTER ONLY)
Abs Immature Granulocytes: 0.01 10*3/uL (ref 0.00–0.07)
Basophils Absolute: 0 10*3/uL (ref 0.0–0.1)
Basophils Relative: 1 %
Eosinophils Absolute: 0.1 10*3/uL (ref 0.0–0.5)
Eosinophils Relative: 4 %
HCT: 32 % — ABNORMAL LOW (ref 36.0–46.0)
Hemoglobin: 10.7 g/dL — ABNORMAL LOW (ref 12.0–15.0)
Immature Granulocytes: 0 %
Lymphocytes Relative: 35 %
Lymphs Abs: 1.2 10*3/uL (ref 0.7–4.0)
MCH: 30.6 pg (ref 26.0–34.0)
MCHC: 33.4 g/dL (ref 30.0–36.0)
MCV: 91.4 fL (ref 80.0–100.0)
Monocytes Absolute: 0.3 10*3/uL (ref 0.1–1.0)
Monocytes Relative: 8 %
Neutro Abs: 1.8 10*3/uL (ref 1.7–7.7)
Neutrophils Relative %: 52 %
Platelet Count: 352 10*3/uL (ref 150–400)
RBC: 3.5 MIL/uL — ABNORMAL LOW (ref 3.87–5.11)
RDW: 12.3 % (ref 11.5–15.5)
WBC Count: 3.4 10*3/uL — ABNORMAL LOW (ref 4.0–10.5)
nRBC: 0 % (ref 0.0–0.2)

## 2020-02-07 LAB — CMP (CANCER CENTER ONLY)
ALT: 34 U/L (ref 0–44)
AST: 36 U/L (ref 15–41)
Albumin: 3.6 g/dL (ref 3.5–5.0)
Alkaline Phosphatase: 133 U/L — ABNORMAL HIGH (ref 38–126)
Anion gap: 7 (ref 5–15)
BUN: 4 mg/dL — ABNORMAL LOW (ref 6–20)
CO2: 28 mmol/L (ref 22–32)
Calcium: 10.2 mg/dL (ref 8.9–10.3)
Chloride: 106 mmol/L (ref 98–111)
Creatinine: 1.02 mg/dL — ABNORMAL HIGH (ref 0.44–1.00)
GFR, Est AFR Am: >60 mL/min (ref >60)
GFR, Estimated: >60 mL/min (ref >60)
Glucose, Bld: 113 mg/dL — ABNORMAL HIGH (ref 70–99)
Potassium: 3.3 mmol/L — ABNORMAL LOW (ref 3.5–5.1)
Sodium: 141 mmol/L (ref 135–145)
Total Bilirubin: 0.4 mg/dL (ref 0.3–1.2)
Total Protein: 7 g/dL (ref 6.5–8.1)

## 2020-02-07 LAB — MAGNESIUM: Magnesium: 1.6 mg/dL — ABNORMAL LOW (ref 1.7–2.4)

## 2020-02-07 MED ORDER — HEPARIN SOD (PORK) LOCK FLUSH 100 UNIT/ML IV SOLN
INTRAVENOUS | Status: AC
  Filled 2020-02-07: qty 5

## 2020-02-07 MED ORDER — HEPARIN SOD (PORK) LOCK FLUSH 100 UNIT/ML IV SOLN
500.0000 [IU] | Freq: Once | INTRAVENOUS | Status: AC
  Administered 2020-02-07: 500 [IU] via INTRAVENOUS

## 2020-02-07 MED ORDER — SODIUM CHLORIDE (PF) 0.9 % IJ SOLN
INTRAMUSCULAR | Status: AC
  Filled 2020-02-07: qty 50

## 2020-02-07 MED ORDER — IOHEXOL 300 MG/ML  SOLN
100.0000 mL | Freq: Once | INTRAMUSCULAR | Status: AC | PRN
  Administered 2020-02-07: 100 mL via INTRAVENOUS

## 2020-02-07 MED ORDER — IOHEXOL 350 MG/ML SOLN: 100.0000 mL | Freq: Once | INTRAVENOUS | Status: DC | PRN

## 2020-02-07 MED ORDER — SODIUM CHLORIDE 0.9% FLUSH
10.0000 mL | Freq: Once | INTRAVENOUS | Status: AC
  Administered 2020-02-07: 10 mL
  Filled 2020-02-07: qty 10

## 2020-02-08 ENCOUNTER — Other Ambulatory Visit: Payer: Self-pay

## 2020-02-08 ENCOUNTER — Inpatient Hospital Stay (HOSPITAL_BASED_OUTPATIENT_CLINIC_OR_DEPARTMENT_OTHER): Payer: BC Managed Care – PPO | Admitting: Oncology

## 2020-02-08 VITALS — BP 129/89 | HR 99 | Temp 97.6°F | Resp 17 | Ht 67.8 in | Wt 202.6 lb

## 2020-02-08 DIAGNOSIS — Z85528 Personal history of other malignant neoplasm of kidney: Secondary | ICD-10-CM | POA: Diagnosis not present

## 2020-02-08 DIAGNOSIS — I10 Essential (primary) hypertension: Secondary | ICD-10-CM | POA: Diagnosis not present

## 2020-02-08 DIAGNOSIS — Z905 Acquired absence of kidney: Secondary | ICD-10-CM | POA: Diagnosis not present

## 2020-02-08 DIAGNOSIS — R918 Other nonspecific abnormal finding of lung field: Secondary | ICD-10-CM | POA: Diagnosis not present

## 2020-02-08 DIAGNOSIS — C250 Malignant neoplasm of head of pancreas: Secondary | ICD-10-CM

## 2020-02-08 DIAGNOSIS — Z8507 Personal history of malignant neoplasm of pancreas: Secondary | ICD-10-CM | POA: Diagnosis not present

## 2020-02-08 DIAGNOSIS — E1140 Type 2 diabetes mellitus with diabetic neuropathy, unspecified: Secondary | ICD-10-CM | POA: Diagnosis not present

## 2020-02-08 DIAGNOSIS — C259 Malignant neoplasm of pancreas, unspecified: Secondary | ICD-10-CM | POA: Diagnosis not present

## 2020-02-08 LAB — CANCER ANTIGEN 19-9: CA 19-9: 10 U/mL (ref 0–35)

## 2020-02-08 NOTE — Progress Notes (Signed)
[place] OFFICE PROGRESS NOTE   Diagnosis: Pancreas cancer  INTERVAL HISTORY:   Alyssa Ross returns as scheduled.  She feels well.  She is working.  She is taking potassium and magnesium supplements.  Objective:  Vital signs in last 24 hours:  Blood pressure 129/89, pulse 99, temperature 97.6 F (36.4 C), temperature source Tympanic, resp. rate 17, height 5' 7.8" (1.722 m), weight 202 lb 9.6 oz (91.9 kg), last menstrual period 09/17/2012, SpO2 100 %.    Lymphatics: No cervical, supraclavicular, axillary, or inguinal nodes Resp: Lungs clear bilaterally Cardio: Regular rate and rhythm GI: No hepatosplenomegaly, no mass, nontender Vascular: No leg edema    Portacath/PICC-without erythema  Lab Results:  Lab Results  Component Value Date   WBC 3.4 (L) 02/07/2020   HGB 10.7 (L) 02/07/2020   HCT 32.0 (L) 02/07/2020   MCV 91.4 02/07/2020   PLT 352 02/07/2020   NEUTROABS 1.8 02/07/2020    CMP  Lab Results  Component Value Date   NA 141 02/07/2020   K 3.3 (L) 02/07/2020   CL 106 02/07/2020   CO2 28 02/07/2020   GLUCOSE 113 (H) 02/07/2020   BUN 4 (L) 02/07/2020   CREATININE 1.02 (H) 02/07/2020   CALCIUM 10.2 02/07/2020   PROT 7.0 02/07/2020   ALBUMIN 3.6 02/07/2020   AST 36 02/07/2020   ALT 34 02/07/2020   ALKPHOS 133 (H) 02/07/2020   BILITOT 0.4 02/07/2020   GFRNONAA >60 02/07/2020   GFRAA >60 02/07/2020    No results found for: CEA1  Lab Results  Component Value Date   INR 1.2 03/05/2019    Imaging:  CT Chest W Contrast  Result Date: 02/07/2020 CLINICAL DATA:  Restaging restaging pancreatic cancer, status post Whipple procedure, follow-up pulmonary nodules EXAM: CT CHEST, ABDOMEN, AND PELVIS WITH CONTRAST TECHNIQUE: Multidetector CT imaging of the chest, abdomen and pelvis was performed following the standard protocol during bolus administration of intravenous contrast. CONTRAST:  190m OMNIPAQUE IOHEXOL 300 MG/ML  SOLN chest COMPARISON:   CT chest abdomen pelvis, 11/29/2019 FINDINGS: CT CHEST FINDINGS Cardiovascular: Left chest port catheter. Scattered aortic atherosclerosis. Normal heart size. No pericardial effusion. Mediastinum/Nodes: No enlarged mediastinal, hilar, or axillary lymph nodes. Thyroid gland, trachea, and esophagus demonstrate no significant findings. Lungs/Pleura: Continued, gradual increase in size of multiple small bilateral pulmonary nodules, the largest nodules demonstrating internal cavitation,, for example a 1.2 cm nodule in the anterior right upper lobe, previously measuring 0.8 cm (series 6, image 53). No pleural effusion or pneumothorax. Musculoskeletal: No chest wall mass or suspicious bone lesions identified. CT ABDOMEN PELVIS FINDINGS Hepatobiliary: No focal liver abnormality is seen. Hepatic steatosis. Status post cholecystectomy and hepaticojejunostomy. No biliary dilatation. Pancreas: Status post Whipple pancreaticoduodenectomy with pancreatic ductal stent. No pancreatic ductal dilatation. No evidence of local recurrence. Spleen: Normal in size without significant abnormality. Adrenals/Urinary Tract: Unchanged right adrenal nodule measuring 2.2 x 1.4 cm (series 2, image 49). Kidneys are normal, without renal calculi, solid lesion, or hydronephrosis. Bladder is unremarkable. Stomach/Bowel: Stomach is within normal limits. Appendix appears normal. No evidence of bowel wall thickening, distention, or inflammatory changes. Vascular/Lymphatic: Aortic atherosclerosis. No enlarged abdominal or pelvic lymph nodes. Reproductive: Bulky uterine fibroids, including a large, pedunculated or exophytic fibroid in the vicinity of the left ovary (series 2, image 104). Other: No abdominal wall hernia or abnormality. No abdominopelvic ascites. Musculoskeletal: No acute or significant osseous findings. IMPRESSION: 1. Continued, gradual increase in size of multiple small bilateral pulmonary nodules, the largest nodules demonstrating  internal cavitation. 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 2. Status post Whipple pancreaticoduodenectomy with pancreatic ductal stent. No evidence of local recurrence or metastatic disease within the abdomen or pelvis. 3. Unchanged right adrenal nodule measuring 2.2 x 1.4 cm, likely an adenoma. Attention on follow-up. 4. Hepatic steatosis. 5. Bulky uterine fibroids, including a large, pedunculated or exophytic fibroid in the vicinity of the left ovary. 6. Aortic Atherosclerosis (ICD10-I70.0). Electronically Signed   By: Eddie Candle M.D.   On: 02/07/2020 14:21   CT Abdomen Pelvis W Contrast  Result Date: 02/07/2020 CLINICAL DATA:  Restaging restaging pancreatic cancer, status post Whipple procedure, follow-up pulmonary nodules EXAM: CT CHEST, ABDOMEN, AND PELVIS WITH CONTRAST TECHNIQUE: Multidetector CT imaging of the chest, abdomen and pelvis was performed following the standard protocol during bolus administration of intravenous contrast. CONTRAST:  162m OMNIPAQUE IOHEXOL 300 MG/ML  SOLN chest COMPARISON:  CT chest abdomen pelvis, 11/29/2019 FINDINGS: CT CHEST FINDINGS Cardiovascular: Left chest port catheter. Scattered aortic atherosclerosis. Normal heart size. No pericardial effusion. Mediastinum/Nodes: No enlarged mediastinal, hilar, or axillary lymph nodes. Thyroid gland, trachea, and esophagus demonstrate no significant findings. Lungs/Pleura: Continued, gradual increase in size of multiple small bilateral pulmonary nodules, the largest nodules demonstrating internal cavitation,, for example a 1.2 cm nodule in the anterior right upper lobe, previously measuring 0.8 cm (series 6, image 53). No pleural effusion or pneumothorax. Musculoskeletal: No chest wall mass or suspicious bone lesions identified. CT ABDOMEN PELVIS FINDINGS Hepatobiliary: No focal liver abnormality is seen. Hepatic steatosis. Status post cholecystectomy and hepaticojejunostomy. No biliary dilatation.  Pancreas: Status post Whipple pancreaticoduodenectomy with pancreatic ductal stent. No pancreatic ductal dilatation. No evidence of local recurrence. Spleen: Normal in size without significant abnormality. Adrenals/Urinary Tract: Unchanged right adrenal nodule measuring 2.2 x 1.4 cm (series 2, image 49). Kidneys are normal, without renal calculi, solid lesion, or hydronephrosis. Bladder is unremarkable. Stomach/Bowel: Stomach is within normal limits. Appendix appears normal. No evidence of bowel wall thickening, distention, or inflammatory changes. Vascular/Lymphatic: Aortic atherosclerosis. No enlarged abdominal or pelvic lymph nodes. Reproductive: Bulky uterine fibroids, including a large, pedunculated or exophytic fibroid in the vicinity of the left ovary (series 2, image 104). Other: No abdominal wall hernia or abnormality. No abdominopelvic ascites. Musculoskeletal: No acute or significant osseous findings. IMPRESSION: 1. Continued, gradual increase in size of multiple small bilateral pulmonary nodules, the largest nodules demonstrating internal cavitation. 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 2. Status post Whipple pancreaticoduodenectomy with pancreatic ductal stent. No evidence of local recurrence or metastatic disease within the abdomen or pelvis. 3. Unchanged right adrenal nodule measuring 2.2 x 1.4 cm, likely an adenoma. Attention on follow-up. 4. Hepatic steatosis. 5. Bulky uterine fibroids, including a large, pedunculated or exophytic fibroid in the vicinity of the left ovary. 6. Aortic Atherosclerosis (ICD10-I70.0). Electronically Signed   By: AEddie CandleM.D.   On: 02/07/2020 14:21    Medications: I have reviewed the patient's current medications.   Assessment/Plan: 1. 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 Tumor invades the duodenal wall and vascular groove, resection margins negative, 4/34 lymph nodes  positive ? MSI-stable, tumor showed instability in 2 loci as did adjacent normal tissue ? 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 CTs 01/29/2019-ill-defined pancreas head mass, 5 pulmonary nodules-1 with a small amount of central cavitation, tumor abuts the left margin of the portal vein indistinct density surrounding, hepatic artery, complex cystic lesion of the right kidney, right adrenal mass-characterized  as an adenoma on a [facility] MRI 12/21/2018 ? 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 Elevated preoperative CA 19-9--CA 19-9 186 on 01/18/2019 ? CT chest 04/16/2019-multiple bilateral pulmonary nodules, some with increased cavitation, stable in size ? Cycle 1 FOLFIRINOX 04/27/2019 ? Cycle 2 FOLFIRINOX 05/11/2019 ? Cycle 3 FOLFIRINOX 05/23/2019 ? Cycle 4 FOLFIRINOX 06/08/2019 ? Cycle 5 FOLFIRINOX 06/22/2019 ? CT chest 07/02/2019-stable size of bilateral pulmonary nodules. Dominant cavitary lesions in both lungs show increased cavitation with thinner walls. Stable 2.1 cm right adrenal nodule. ? Cycle 6 FOLFIRINOX 07/06/2019 ? Cycle 7 FOLFIRINOX 07/21/2019 ? Cycle 8 FOLFIRINOX 08/03/2019, oxaliplatin deleted secondary to neuropathy ? CT chest 08/24/2019-decreased size of several lung nodules with resolution of a left upper lobe nodule, no new nodules ? Radiation to the pancreas surgical area with concurrent Xeloda 09/13/2019-10/20/2019  ? CTs 11/29/2019-multiple small pulmonary nodules scattered throughout the lungs bilaterally, appear increased in number and size. No definite evidence of metastatic disease in the abdomen or pelvis. Markedly enlarged and heterogeneous appearing uterus, likely to represent multifocal fibroids. 1 of these lesions appears to be an exophytic subserosal fibroid in the posterior lateral aspect  of the uterine body on the left side although this comes in close proximity to the left adnexa such that a primary ovarian lesion is difficult to completely exclude. ? CTs 02/07/2020-slight enlargement of bilateral lung nodules, some are cavitary, no evidence of metastatic disease in the abdomen or pelvis, stable right adrenal nodule, uterine fibroids  2. 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 Diabetes 4. Hypertension 5. Family history of pancreas cancer, INVITAE panel-VUS in the TERT 6. Port-A-Cath placement, Dr. 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  The restaging CTs reveal slight enlargement of bilateral lung nodules, no other evidence of disease progression.  I reviewed the CT images with Alyssa Ross.  Her sister was present by telephone for today's visit.  We discussed the differential diagnosis.  She understands it is very likely the lung nodules are related to metastatic pancreas cancer, but a biopsy would be definitive.  I do not think she would be a candidate for a percutaneous biopsy, but I will asked the interventional radiologist.  She does not wish to consider a surgical biopsy at present.  I will present her case at the GI tumor conference next week.  The plan is to continue observation for now.  She will return for an office visit and Port-A-Cath flush in 6 weeks.  We will plan for a restaging CT in approximately 3 months.  The hypokalemia and hypomagnesemia may be related to the HCTZ or previous treatment with oxaliplatin.  She will increase the potassium to twice daily.  [name], MD  02/08/2020  12:00 PM

## 2020-02-09 ENCOUNTER — Telehealth: Payer: Self-pay | Admitting: Oncology

## 2020-02-09 NOTE — Telephone Encounter (Signed)
Scheduled appointments per 8/31 los. Patient is aware of appointments dates and times.

## 2020-02-16 ENCOUNTER — Telehealth: Payer: Self-pay

## 2020-02-16 ENCOUNTER — Other Ambulatory Visit: Payer: Self-pay

## 2020-02-16 NOTE — Telephone Encounter (Signed)
Per Dr. Benay Spice I spoke to patient regarding recommendation of GI Conference today.  I explained to her that their recommendation is to observe the lung nodule and if it progresses that we will pursue a biopsy of interventional radiology.  She verbalized an understanding.

## 2020-03-06 ENCOUNTER — Encounter: Payer: Self-pay | Admitting: Endocrinology

## 2020-03-09 ENCOUNTER — Other Ambulatory Visit: Payer: Self-pay

## 2020-03-09 ENCOUNTER — Other Ambulatory Visit (INDEPENDENT_AMBULATORY_CARE_PROVIDER_SITE_OTHER): Payer: BC Managed Care – PPO

## 2020-03-09 DIAGNOSIS — Z794 Long term (current) use of insulin: Secondary | ICD-10-CM | POA: Diagnosis not present

## 2020-03-09 DIAGNOSIS — E1165 Type 2 diabetes mellitus with hyperglycemia: Secondary | ICD-10-CM

## 2020-03-09 LAB — BASIC METABOLIC PANEL
BUN: 8 mg/dL (ref 6–23)
CO2: 27 mEq/L (ref 19–32)
Calcium: 9.2 mg/dL (ref 8.4–10.5)
Chloride: 105 mEq/L (ref 96–112)
Creatinine, Ser: 1.14 mg/dL (ref 0.40–1.20)
GFR: 60.09 mL/min (ref >60.00)
Glucose, Bld: 234 mg/dL — ABNORMAL HIGH (ref 70–99)
Potassium: 3.6 mEq/L (ref 3.5–5.1)
Sodium: 139 mEq/L (ref 135–145)

## 2020-03-09 LAB — MICROALBUMIN / CREATININE URINE RATIO
Creatinine,U: 80.9 mg/dL
Microalb Creat Ratio: 0.9 mg/g (ref 0.0–30.0)
Microalb, Ur: <0.7 mg/dL (ref 0.0–1.9)

## 2020-03-09 LAB — HEMOGLOBIN A1C: Hgb A1c MFr Bld: 8.5 % — ABNORMAL HIGH (ref 4.6–6.5)

## 2020-03-13 ENCOUNTER — Encounter: Payer: Self-pay | Admitting: Endocrinology

## 2020-03-13 ENCOUNTER — Telehealth (INDEPENDENT_AMBULATORY_CARE_PROVIDER_SITE_OTHER): Payer: BC Managed Care – PPO | Admitting: Endocrinology

## 2020-03-13 VITALS — Ht 68.5 in | Wt 198.0 lb

## 2020-03-13 DIAGNOSIS — E1165 Type 2 diabetes mellitus with hyperglycemia: Secondary | ICD-10-CM | POA: Diagnosis not present

## 2020-03-13 DIAGNOSIS — E876 Hypokalemia: Secondary | ICD-10-CM

## 2020-03-13 DIAGNOSIS — Z794 Long term (current) use of insulin: Secondary | ICD-10-CM | POA: Diagnosis not present

## 2020-03-13 MED ORDER — GLUCOSE BLOOD VI STRP: ORAL_STRIP | 12 refills | Status: DC

## 2020-03-13 NOTE — Progress Notes (Signed)
Patient ID: Alyssa Ross, female   DOB: [birth year], 54 y.o.   MRN: [id number]  I connected with the above-named patient by video enabled telemedicine application and verified that I am speaking with the correct person. The patient was explained the limitations of evaluation and management by telemedicine and the availability of in person appointments.  Patient also understood that there may be a patient responsible charge related to this service  Location of the patient: Patient's home  Location of the provider: Physician office Only the patient and myself were participating in the encounter The patient understood the above statements and agreed to proceed.   Reason for Appointment: Diabetes follow-up    History of Present Illness   Diagnosis: Type 2 DIABETES MELITUS, date of diagnosis 2005   She had previously been on metformin and Amaryl Because of inadequate control she was given Victoza in addition in 2011 With this her blood sugars have been significantly better and her A1c has been as low as 6.2 in 2013 Her blood sugars have been more difficult to control since 2014 an A1c consistently over 7% She  was started on insulin with Levemir in 06/2013 because of  increase in her A1c to 8.5% She had previously been taking Victoza along with Amaryl and metformin without consistent control Also had difficulty losing weight despite taking 1.8 mg Victoza, also had tried the 3 mg dose Because of episodic nausea and diarrhea in 1/16 she went off her Victoza  RECENT history:   INSULIN dose: TRESIBA 12 units pm.  Humalog mix 75/25, 20-24 units at breakfast, Humalog 3 to 4 units at random times  Non-insulin hypoglycemic drugs: on metformin 2000 mg,  A1c is back up to 8.5   Current management, blood sugars and problems identified as follows  She feels that her blood sugars are higher partly from drinking sweet tea, temporarily reducing her Humalog mix insulin and not covering  meals with Humalog  She only takes occasional doses of 3-4 units of Humalog  Not counting carbohydrates  Her blood sugars are overall significantly high postprandially depending on her intake but also appears to be progressively higher from morning to night  Even with exercise at the gym in the mornings she does not have any drop in blood sugars  Her weight has slowly come down  As discussed in the CGM interpretation below her blood sugars are the highest between 4 PM-12 AM  She says her sensor is malfunctioning and she is waiting for placement from the company  Has not compared her sensor readings to the fingersticks     Side effects from medications: None   Monitors blood glucose: With freestyle Libre sensor   CONTINUOUS GLUCOSE MONITORING RECORD INTERPRETATION    Dates of Recording: 9/14 through 9/27  Sensor description: Elenor Legato  Results statistics:   CGM use % of time  100  Average and SD  207 GV 43  Time in range      42%  % Time Above 180  25  % Time above 250  31  % Time Below target 2    PRE-MEAL Fasting Lunch Dinner Bedtime Overall  Glucose range:       Mean/median:  122  191  254  274  207   POST-MEAL PC Breakfast PC Lunch PC Dinner  Glucose range:     Mean/median:  124  227  2067    Glycemic patterns summary:   Hyperglycemic episodes these are occurring at variable times throughout  the day, generally blood sugars are progressively higher from morning to evening with significant variability  Hypoglycemic episodes have been minimal with only one low normal sugar around 6 AM  Overnight periods: Blood sugars are usually significantly high at midnight and come down significantly by 6 AM to near normal  Preprandial periods: Blood sugars are mildly increased at breakfast and generally progressively higher at lunch and dinner but are quite variable  Postprandial periods:   Blood sugars are spiking at most meals to a variable extent, most significant  increase is usually at lunchtime  Previous data:   CGM use % of time  96  2-week average/SD  166, GV 40  Time in range       56 %  % Time Above 180  26  % Time above 250  13  % Time Below 70 5     PRE-MEAL Fasting Lunch Dinner Bedtime Overall  Glucose range:       Averages:  116   186  205  166   POST-MEAL PC Breakfast PC Lunch PC Dinner  Glucose range:     Averages:  165  182  218      Meals: 3 meals per day. Dinner 8-9 pm     DIET: As above         Wt Readings from Last 3 Encounters:  03/13/20 198 lb (89.8 kg)  02/08/20 202 lb 9.6 oz (91.9 kg)  01/25/20 203 lb 3.2 oz (92.2 kg)    LABS:  Lab Results  Component Value Date   HGBA1C 8.5 (H) 03/09/2020   HGBA1C 7.7 (H) 12/07/2019   HGBA1C 8.6 (H) 07/26/2019   Lab Results  Component Value Date   MICROALBUR <0.7 03/09/2020   LDLCALC 68 01/25/2020   CREATININE 1.14 03/09/2020    Lab on 03/09/2020  Component Date Value Ref Range Status   Microalb, Ur 03/09/2020 <0.7  0.0 - 1.9 mg/dL Final   Creatinine,U 03/09/2020 80.9  mg/dL Final   Microalb Creat Ratio 03/09/2020 0.9  0.0 - 30.0 mg/g Final   Sodium 03/09/2020 139  135 - 145 mEq/L Final   Potassium 03/09/2020 3.6  3.5 - 5.1 mEq/L Final   Chloride 03/09/2020 105  96 - 112 mEq/L Final   CO2 03/09/2020 27  19 - 32 mEq/L Final   Glucose, Bld 03/09/2020 234* 70 - 99 mg/dL Final   BUN 03/09/2020 8  6 - 23 mg/dL Final   Creatinine, Ser 03/09/2020 1.14  0.40 - 1.20 mg/dL Final   GFR 03/09/2020 60.09  >60.00 mL/min Final   Calcium 03/09/2020 9.2  8.4 - 10.5 mg/dL Final   Hgb A1c MFr Bld 03/09/2020 8.5* 4.6 - 6.5 % Final   Glycemic Control Guidelines for People with Diabetes:Non Diabetic:  <6%Goal of Therapy: <7%Additional Action Suggested:  >8%     Allergies as of 03/13/2020      Reactions   Cherry Rash   Lemon Oil Rash      Medication List       Accurate as of March 13, 2020  8:10 AM. If you have any questions, ask your nurse or doctor.         STOP taking these medications   baclofen 10 MG tablet Commonly known as: LIORESAL Stopped by: Alyssa Snare, MD   ondansetron 8 MG tablet Commonly known as: ZOFRAN Stopped by: Alyssa Snare, MD   oxyCODONE 5 MG immediate release tablet Commonly known as: Oxy IR/ROXICODONE Stopped by: Alyssa Snare, MD  prochlorperazine 10 MG tablet Commonly known as: COMPAZINE Stopped by: Alyssa Snare, MD   traMADol 50 MG tablet Commonly known as: ULTRAM Stopped by: Alyssa Snare, MD     TAKE these medications   FreeStyle Libre 14 Day Reader Kerrin Mo USE READER TO CHECK BLOOD GLUCOSE WITH FREESTYLE LIBRE SENSORS.   FreeStyle Libre 14 Day Sensor Misc APPLY SENSOR TO BODY ONCE EVERY 14 DAYS TO MONITOR BLOOD GLUCOSE   HumaLOG Mix 75/25 KwikPen (75-25) 100 UNIT/ML Kwikpen Generic drug: Insulin Lispro Prot & Lispro Inject 24 units under the skin once daily before lunch. What changed:   how much to take  how to take this  additional instructions   insulin lispro 100 UNIT/ML KwikPen Commonly known as: HumaLOG KwikPen Inject 20 units under the skin three times daily before meals.   Klor-Con M20 20 MEQ tablet Generic drug: potassium chloride SA TAKE 1 TABLET DAILY   lidocaine-prilocaine cream Commonly known as: EMLA Apply 1 application topically as directed. Apply 1 hour prior to stick and cover with plastic wrap   lisinopril-hydrochlorothiazide 10-12.5 MG tablet Commonly known as: ZESTORETIC TAKE 1 TABLET DAILY   loratadine 10 MG tablet Commonly known as: CLARITIN Take 10 mg by mouth at bedtime.   MAGnesium-Oxide 400 (241.3 Mg) MG tablet Generic drug: magnesium oxide TAKE 1 TABLET(400 MG) BY MOUTH TWICE DAILY   metFORMIN 500 MG 24 hr tablet Commonly known as: GLUCOPHAGE-XR Take 4 tablets (2000mg  total) by mouth once daily.   NovoFine Plus 32G X 4 MM Misc Generic drug: Insulin Pen Needle USE TO INJECT INSULIN THREE TIMES DAILY   rosuvastatin 10 MG tablet Commonly known as:  CRESTOR Take 10 mg by mouth daily.   TRESIBA FLEXTOUCH Martin Inject 12 Units into the skin at bedtime.       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   PONV (postoperative nausea and vomiting)    nausea  vomitting after 12/25/18 ERCP   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   Yeast vaginitis 2006    Past Surgical History:  Procedure Laterality Date   BILIARY STENT PLACEMENT N/A 12/25/2018   Procedure: BILIARY STENT PLACEMENT;  Surgeon: Carol Ada, MD;  Location: [facility];  Service: Endoscopy;  Laterality: N/A;   BILIARY STENT PLACEMENT N/A 01/01/2019   Procedure: BILIARY STENT PLACEMENT;  Surgeon: Carol Ada, MD;  Location: [facility];  Service: Endoscopy;  Laterality: N/A;   DILATATION & CURETTAGE/HYSTEROSCOPY WITH TRUECLEAR N/A 01/20/2014   Procedure: DILATATION & CURETTAGE/HYSTEROSCOPY WITH TRUCLEAR;  Surgeon: [name], MD;  Location: [facility];  Service: Gynecology;  Laterality: N/A;   DILATATION & CURRETTAGE/HYSTEROSCOPY WITH RESECTOCOPE N/A 01/20/2014   Procedure: DILATATION & CURETTAGE/HYSTEROSCOPY WITH RESECTOCOPE;  Surgeon: [name], MD;  Location: [facility];  Service: Gynecology;  Laterality: N/A;   ENDOSCOPIC RETROGRADE CHOLANGIOPANCREATOGRAPHY (ERCP) WITH PROPOFOL N/A 12/25/2018   Procedure: ENDOSCOPIC RETROGRADE CHOLANGIOPANCREATOGRAPHY (ERCP) WITH PROPOFOL;  Surgeon: Carol Ada, MD;  Location: [facility];  Service: Endoscopy;  Laterality: N/A;    ENDOSCOPIC RETROGRADE CHOLANGIOPANCREATOGRAPHY (ERCP) WITH PROPOFOL N/A 01/01/2019   Procedure: ENDOSCOPIC RETROGRADE CHOLANGIOPANCREATOGRAPHY (ERCP) WITH PROPOFOL;  Surgeon: Carol Ada, MD;  Location: [facility];  Service: Endoscopy;  Laterality: N/A;   ERCP  12/25/2018   FINE NEEDLE ASPIRATION N/A 01/01/2019   Procedure: FINE NEEDLE ASPIRATION (FNA) LINEAR;  Surgeon: Carol Ada, MD;  Location: [facility];  Service: Endoscopy;  Laterality: N/A;   LAPAROSCOPY N/A 03/04/2019   Procedure: LAPAROSCOPY DIAGNOSTIC;  Surgeon: Stark Klein, MD;  Location: [facility];  Service: General;  Laterality: N/A;  GENERAL AND EPIDURAL   NO PAST SURGERIES     PARTIAL NEPHRECTOMY Right 03/04/2019   Procedure: Nephrectomy Partial;  Surgeon: Cleon Gustin, MD;  Location: [place];  Service: Urology;  Laterality: Right;   PORTACATH PLACEMENT Left 04/21/2019   Procedure: INSERTION PORT-A-CATH;  Surgeon: Stark Klein, MD;  Location: [place];  Service: General;  Laterality: Left;   SPHINCTEROTOMY  12/25/2018   Procedure: Joan Mayans;  Surgeon: Carol Ada, MD;  Location: [facility];  Service: Endoscopy;;   STENT REMOVAL  01/01/2019   Procedure: STENT REMOVAL;  Surgeon: Carol Ada, MD;  Location: [facility];  Service: Endoscopy;;   UPPER ESOPHAGEAL ENDOSCOPIC ULTRASOUND (EUS) N/A 01/01/2019   Procedure: UPPER ESOPHAGEAL ENDOSCOPIC ULTRASOUND (EUS);  Surgeon: Carol Ada, MD;  Location: [facility];  Service: Endoscopy;  Laterality: N/A;   WHIPPLE PROCEDURE N/A 03/04/2019   Procedure: WHIPPLE PROCEDURE;  Surgeon: Stark Klein, MD;  Location: [place];  Service: General;  Laterality: N/A;  GENERAL AND EPIDURAL    Family History  Problem Relation Age of Onset   Diabetes Mother    Dementia Mother    Hyperlipidemia Mother    Hypertension Mother    Irregular heart beat Mother    Diabetes Father    Hypertension Father    Hyperlipidemia Father    Down syndrome Sister    Diabetes  Sister    Hyperlipidemia Brother    Heart Problems Brother    Goiter Maternal Aunt    Thyroid nodules Sister    Cancer Cousin 26       eye; maternal first cousin   Goiter Cousin    Cancer Paternal Aunt        unknown form of cancer   Cancer Cousin        unknown form of cancer; paternal first cousin   Pancreatic cancer Cousin 43       paternal first cousin   Cancer Cousin 62       unknown cancer; paternal first cousin   Cancer Cousin 70       unknown cancer; paternal first cousin    Social History:  reports that she has never smoked. She has never used smokeless tobacco. She reports current alcohol use. She reports that she does not use drugs.  REVIEW of systems:  She is being followed for her pancreatic tumor by oncology, last CT scan shows pulmonary nodules that are increasing  She is being followed by her PCP for hypertension with lisinopril HCT 10/12 .5 mg daily She has been followed in the oncology clinic regularly, now last BP was high because of anxiety   BP Readings from Last 3 Encounters:  02/08/20 129/89  01/25/20 110/82  11/30/19 (!) 140/103     Has had hypercholesterolemia, was taking Crestor 20 mg with following results  Labs as follows  Lab Results  Component Value Date   CHOL 131 01/25/2020   HDL  42 01/25/2020   LDLCALC 68 01/25/2020   LDLDIRECT 97.7 02/11/2014   TRIG 114 01/25/2020   CHOLHDL 3.1 01/25/2020      Examination:   Ht 5' 8.5" (1.74 m)    Wt 198 lb (89.8 kg)    LMP 09/17/2012    BMI 29.67 kg/m   Body mass index is 29.67 kg/m.    Assessment/Plan:   Diabetes type 2, on insulin  See history of present illness for detailed discussion of her current management, blood sugar patterns and problems identified  Her A1c is usually over 8% and now 8.5  As discussed above she has inadequate control of postprandial hyperglycemia both from dietary factors as well as inadequate rapid acting meal time insulin Current types of  meals and carbohydrate intake as well as treat the cause her blood sugar to spike and she is not taking any mealtime coverage for this Blood sugars are generally higher despite exercising  Urine microalbumin normal and renal function and potassium continues to be normal  Recommendations:  She needs to check fingerstick reading to periodically compare her actual blood sugars today freestyle libre and new prescription will be given for One Touch ultra 2 strips  If sensor is not accurate she can try the Oxford version 2  Discussed that she can use the calorie [employer] and use carbohydrate counting to figure out the Humalog dose at meals  Does need to have mealtime coverage at every meal unless she has no carbohydrate  Try dividing the carbohydrate intake by factor of 5 to figure out insulin doses  Also she will need to try and apply her freestyle libre sensor on the more outside part of the arm or the inner arm  If this is not effective she may need to try the Dexcom or the guardian sensor  She will cut back on sweet tea  Continue 24 units of Humalog mix  We will need to review her glucose patterns when she has made the changes above  Needs follow-up in 2 months  May reduce Tresiba if morning sugars are low normal    There are no Patient Instructions on file for this visit.      Alyssa Ross 03/13/2020, 8:10 AM   Note: This office note was prepared with Dragon voice recognition system technology. Any transcriptional errors that result from this process are unintentional.

## 2020-03-21 ENCOUNTER — Other Ambulatory Visit: Payer: BC Managed Care – PPO

## 2020-03-21 ENCOUNTER — Other Ambulatory Visit: Payer: Self-pay | Admitting: *Deleted

## 2020-03-21 ENCOUNTER — Inpatient Hospital Stay (HOSPITAL_BASED_OUTPATIENT_CLINIC_OR_DEPARTMENT_OTHER): Payer: BC Managed Care – PPO | Admitting: Nurse Practitioner

## 2020-03-21 ENCOUNTER — Encounter: Payer: Self-pay | Admitting: Nurse Practitioner

## 2020-03-21 ENCOUNTER — Ambulatory Visit: Payer: BC Managed Care – PPO | Admitting: Nurse Practitioner

## 2020-03-21 ENCOUNTER — Inpatient Hospital Stay: Payer: BC Managed Care – PPO

## 2020-03-21 ENCOUNTER — Telehealth: Payer: Self-pay | Admitting: Oncology

## 2020-03-21 ENCOUNTER — Inpatient Hospital Stay: Payer: BC Managed Care – PPO | Attending: Genetic Counselor

## 2020-03-21 ENCOUNTER — Telehealth: Payer: Self-pay | Admitting: Nurse Practitioner

## 2020-03-21 ENCOUNTER — Other Ambulatory Visit: Payer: Self-pay

## 2020-03-21 VITALS — BP 119/84 | HR 77 | Temp 96.8°F | Resp 16 | Ht 68.5 in | Wt 200.8 lb

## 2020-03-21 DIAGNOSIS — Z8507 Personal history of malignant neoplasm of pancreas: Secondary | ICD-10-CM | POA: Insufficient documentation

## 2020-03-21 DIAGNOSIS — Z95828 Presence of other vascular implants and grafts: Secondary | ICD-10-CM

## 2020-03-21 DIAGNOSIS — C250 Malignant neoplasm of head of pancreas: Secondary | ICD-10-CM | POA: Diagnosis not present

## 2020-03-21 DIAGNOSIS — Z23 Encounter for immunization: Secondary | ICD-10-CM | POA: Insufficient documentation

## 2020-03-21 DIAGNOSIS — Z923 Personal history of irradiation: Secondary | ICD-10-CM | POA: Insufficient documentation

## 2020-03-21 DIAGNOSIS — E1165 Type 2 diabetes mellitus with hyperglycemia: Secondary | ICD-10-CM

## 2020-03-21 DIAGNOSIS — Z9221 Personal history of antineoplastic chemotherapy: Secondary | ICD-10-CM | POA: Diagnosis not present

## 2020-03-21 LAB — CMP (CANCER CENTER ONLY)
ALT: 39 U/L (ref 0–44)
AST: 40 U/L (ref 15–41)
Albumin: 3.7 g/dL (ref 3.5–5.0)
Alkaline Phosphatase: 123 U/L (ref 38–126)
Anion gap: 3 — ABNORMAL LOW (ref 5–15)
BUN: 9 mg/dL (ref 6–20)
CO2: 29 mmol/L (ref 22–32)
Calcium: 9.5 mg/dL (ref 8.9–10.3)
Chloride: 110 mmol/L (ref 98–111)
Creatinine: 1.11 mg/dL — ABNORMAL HIGH (ref 0.44–1.00)
GFR, Estimated: 56 mL/min — ABNORMAL LOW (ref >60)
Glucose, Bld: 74 mg/dL (ref 70–99)
Potassium: 3.4 mmol/L — ABNORMAL LOW (ref 3.5–5.1)
Sodium: 142 mmol/L (ref 135–145)
Total Bilirubin: 0.4 mg/dL (ref 0.3–1.2)
Total Protein: 7.2 g/dL (ref 6.5–8.1)

## 2020-03-21 LAB — MAGNESIUM: Magnesium: 1.7 mg/dL (ref 1.7–2.4)

## 2020-03-21 MED ORDER — HEPARIN SOD (PORK) LOCK FLUSH 100 UNIT/ML IV SOLN
500.0000 [IU] | Freq: Once | INTRAVENOUS | Status: AC
  Administered 2020-03-21: 500 [IU]
  Filled 2020-03-21: qty 5

## 2020-03-21 MED ORDER — INFLUENZA VAC SPLIT QUAD 0.5 ML IM SUSY
0.5000 mL | PREFILLED_SYRINGE | Freq: Once | INTRAMUSCULAR | Status: AC
  Administered 2020-03-21: 0.5 mL via INTRAMUSCULAR

## 2020-03-21 MED ORDER — INFLUENZA VAC SPLIT QUAD 0.5 ML IM SUSY
PREFILLED_SYRINGE | INTRAMUSCULAR | Status: AC
  Filled 2020-03-21: qty 0.5

## 2020-03-21 MED ORDER — SODIUM CHLORIDE 0.9% FLUSH
10.0000 mL | Freq: Once | INTRAVENOUS | Status: AC
  Administered 2020-03-21: 10 mL
  Filled 2020-03-21: qty 10

## 2020-03-21 MED ORDER — GLUCOSE BLOOD VI STRP: ORAL_STRIP | 12 refills | Status: AC

## 2020-03-21 MED ORDER — POTASSIUM CHLORIDE CRYS ER 20 MEQ PO TBCR: 20.0000 meq | EXTENDED_RELEASE_TABLET | Freq: Two times a day (BID) | ORAL | 3 refills | Status: DC

## 2020-03-21 NOTE — Progress Notes (Signed)
[place] OFFICE PROGRESS NOTE   Diagnosis: Pancreas cancer  INTERVAL HISTORY:   Alyssa Ross returns as scheduled.  She feels well.  She has a good appetite.  She denies pain.  No nausea or vomiting.  No diarrhea though she does have frequent bowel movements.  She has mild neuropathy symptoms mainly affecting the feet.  No fever, cough, shortness of breath.  Objective:  Vital signs in last 24 hours:  Blood pressure 119/84, pulse 77, temperature (!) 96.8 F (36 C), temperature source Tympanic, resp. rate 16, height 5' 8.5" (1.74 m), weight 200 lb 12.8 oz (91.1 kg), last menstrual period 09/17/2012, SpO2 100 %.    HEENT: Neck without mass. Lymphatics: No palpable cervical, supraclavicular, axillary or inguinal lymph nodes. Resp: Lungs clear bilaterally. Cardio: Regular rate and rhythm. GI: Abdomen soft and nontender.  No hepatomegaly.  No mass. Vascular: No leg edema. Port-A-Cath without erythema.  Lab Results:  Lab Results  Component Value Date   WBC 3.4 (L) 02/07/2020   HGB 10.7 (L) 02/07/2020   HCT 32.0 (L) 02/07/2020   MCV 91.4 02/07/2020   PLT 352 02/07/2020   NEUTROABS 1.8 02/07/2020    Imaging:  No results found.  Medications: I have reviewed the patient's current medications.  Assessment/Plan: 1. 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 Tumor invades the duodenal wall and vascular groove, resection margins negative, 4/34 lymph nodes positive ? MSI-stable, tumor showed instability in 2 loci as did adjacent normal tissue ? 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 CTs 01/29/2019-ill-defined pancreas head mass, 5 pulmonary nodules-1 with a small amount of central cavitation, tumor abuts the left margin of the portal vein indistinct density surrounding, hepatic artery, complex cystic lesion of the right kidney, right adrenal mass-characterized as an adenoma on a  [facility] MRI 12/21/2018 ? 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 Elevated preoperative CA 19-9--CA 19-9 186 on 01/18/2019 ? CT chest 04/16/2019-multiple bilateral pulmonary nodules, some with increased cavitation, stable in size ? Cycle 1 FOLFIRINOX 04/27/2019 ? Cycle 2 FOLFIRINOX 05/11/2019 ? Cycle 3 FOLFIRINOX 05/23/2019 ? Cycle 4 FOLFIRINOX 06/08/2019 ? Cycle 5 FOLFIRINOX 06/22/2019 ? CT chest 07/02/2019-stable size of bilateral pulmonary nodules. Dominant cavitary lesions in both lungs show increased cavitation with thinner walls. Stable 2.1 cm right adrenal nodule. ? Cycle 6 FOLFIRINOX 07/06/2019 ? Cycle 7 FOLFIRINOX 07/21/2019 ? Cycle 8 FOLFIRINOX 08/03/2019, oxaliplatin deleted secondary to neuropathy ? CT chest 08/24/2019-decreased size of several lung nodules with resolution of a left upper lobe nodule, no new nodules ? Radiationto the pancreas surgical area with concurrent Xeloda 09/13/2019-10/20/2019 ? CTs 11/29/2019-multiple small pulmonary nodules scattered throughout the lungs bilaterally, appear increased in number and size. No definite evidence of metastatic disease in the abdomen or pelvis. Markedly enlarged and heterogeneous appearing uterus, likely to represent multifocal fibroids. 1 of these lesions appears to be an exophytic subserosal fibroid in the posterior lateral aspect of the uterine body on the left side although this comes in close proximity to the left adnexa such that a primary ovarian lesion is difficult to completely exclude. ? CTs 02/07/2020-slight enlargement of bilateral lung nodules, some are cavitary, no evidence of metastatic disease in the abdomen or pelvis, stable right adrenal nodule, uterine fibroids  2. 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 Diabetes 4. Hypertension 5. Family history of pancreas cancer,  INVITAE panel-VUS  in the TERT 6. Port-A-Cath placement, Dr. 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 appears stable.  There is no clinical evidence of disease progression.  She will undergo restaging CTs in approximately 6 weeks which will be at a 25-monthinterval from the previous.  We will see her in follow-up a few days after CTs to review the results. She will contact the office in the interim with any problems.  Plan reviewed with Dr. SBenay Spice    LNed CardANP/GNP-BC   03/21/2020  2:07 PM

## 2020-03-21 NOTE — Patient Instructions (Signed)

## 2020-03-21 NOTE — Telephone Encounter (Signed)
Scheduled appointments per 10/12 los. Spoke to patient who is aware of appointments dates and times. Gave patient updated calendar.  

## 2020-03-21 NOTE — Telephone Encounter (Signed)
Scheduled appointment per 10/12 los. Spoke to patient who is aware of appointment date and time.

## 2020-03-30 ENCOUNTER — Inpatient Hospital Stay: Payer: BC Managed Care – PPO | Admitting: Nutrition

## 2020-03-30 ENCOUNTER — Other Ambulatory Visit: Payer: Self-pay

## 2020-03-30 NOTE — Progress Notes (Signed)
Patient presents to nutrition status post pancreas cancer and [place] on March 04, 2019.  She also had a partial nephrectomy. Current weight was documented as 200.8 pounds October 12. Patient reports her appetite is good and she is eating well.  She reports frequent stools but they are not diarrhea.   She does not take pancreatic enzymes. Reports she usually does not tolerate fried foods and avoids them.  She occasionally can tolerate beef.  Patient requesting calorie and macronutrient breakdown which is safe after cancer treatment and surgery.  She hopes to lose weight and build muscle mass.  She has been working remotely with a  Chief Strategy Officer" in [place].  She reports he limits carbohydrates, calories and encourages 5 meals daily.  Nutrition diagnosis: Food and nutrition related knowledge deficit related to pancreas cancer as evidenced by no prior need for nutrition related information.  Intervention: Educated patient on healthy diet. Discouraged patient from cutting calories to less than 1200 cal daily. Educated on macronutrient distribution.  I encouraged her to continue small frequent meals with healthy food choices 5 times daily.  Recommended she check with MD prior to beginning strict exercise program.  Monitoring, evaluation, goals: Patient will tolerate a healthy diet to promote safe weight loss.  Patient should be referred to outpatient nutrition and diabetes education services for further diet education on diabetes/weight loss.  **Disclaimer: This note was dictated with voice recognition software. Similar sounding words can inadvertently be transcribed and this note may contain transcription errors which may not have been corrected upon publication of note.**

## 2020-04-02 ENCOUNTER — Other Ambulatory Visit: Payer: Self-pay | Admitting: Oncology

## 2020-04-04 ENCOUNTER — Telehealth: Payer: Self-pay | Admitting: Emergency Medicine

## 2020-04-04 ENCOUNTER — Telehealth: Payer: Self-pay | Admitting: *Deleted

## 2020-04-04 ENCOUNTER — Inpatient Hospital Stay (HOSPITAL_BASED_OUTPATIENT_CLINIC_OR_DEPARTMENT_OTHER): Payer: BC Managed Care – PPO | Admitting: Nurse Practitioner

## 2020-04-04 ENCOUNTER — Other Ambulatory Visit: Payer: Self-pay

## 2020-04-04 ENCOUNTER — Encounter: Payer: Self-pay | Admitting: Nurse Practitioner

## 2020-04-04 ENCOUNTER — Encounter: Payer: Self-pay | Admitting: Oncology

## 2020-04-04 VITALS — BP 108/78 | HR 98 | Temp 97.0°F | Resp 17 | Ht 68.5 in | Wt 200.4 lb

## 2020-04-04 DIAGNOSIS — Z923 Personal history of irradiation: Secondary | ICD-10-CM | POA: Diagnosis not present

## 2020-04-04 DIAGNOSIS — Z9221 Personal history of antineoplastic chemotherapy: Secondary | ICD-10-CM | POA: Diagnosis not present

## 2020-04-04 DIAGNOSIS — Z23 Encounter for immunization: Secondary | ICD-10-CM | POA: Diagnosis not present

## 2020-04-04 DIAGNOSIS — R1030 Lower abdominal pain, unspecified: Secondary | ICD-10-CM | POA: Diagnosis not present

## 2020-04-04 DIAGNOSIS — Z8507 Personal history of malignant neoplasm of pancreas: Secondary | ICD-10-CM | POA: Diagnosis not present

## 2020-04-04 NOTE — Telephone Encounter (Addendum)
Since 03/31/20 doing yoga she has developed low/right abdominal pain that is constant dull pain. Has been taking ibuprofen for pain--currently "3/10". Changing position brings out the pain. Bowels moving and no nausea. Also reports increase in fatigue. Wants to be seen today, expresses that she is worried. Called patient back w/appointment to see Regan Rakers, NP today at 1:45 (time requested by patient).

## 2020-04-04 NOTE — Telephone Encounter (Signed)
Spoke with patient and RN Manuela Schwartz for MD [place] office who is handling pt's appt request.  Pt aware, no further needs at this time from [facility] RN.

## 2020-04-04 NOTE — Progress Notes (Signed)
Alyssa Ross   [phone] [phone]) [phone]   Clinic Follow up Note   Patient Care Team: Glendale Chard, MD as PCP - General (Internal Medicine) Arna Snipe, RN (Inactive) as Oncology Nurse Navigator 04/04/2020  CHIEF COMPLAINT: Follow-up lower abdominal pain  INTERVAL HISTORY: [name] returns for work in appointment for new onset abdominal pain.  She was last seen by Ned Card, NP on 10/12 for routine surveillance visit.  She has been in her usual state of health until a few days ago after going to the gym to work out some low back pain that she has from poor ergonomics/posture at her home office.  She did a "dead lift" and tweaked her back, came home and used a foam roller which required her to engage her core and use her abdominal muscles.  She reports not having done any abdominal strength exercises in about 2 years prior.  She woke up the next morning with lower abdominal pain.  She did not eat or drink much due to the discomfort which subsequently led to mild constipation.  Pain improved with ibuprofen, nearly gone today.  Denies n/v or weight loss.  She had fatigue and chills for 1 day after the flu shot last week.  No other fever, cough, chest pain, dyspnea, dysuria.  She denies any other changes in her daily routine, medication, diet, or otherwise.   MEDICAL HISTORY:  Past Medical History:  Diagnosis Date  . Anemia   . ASCUS (atypical squamous cells of undetermined significance) on Pap smear 08/06/1999  . Breast mass in female 2002   Left  . Cancer [facility])    Neuroendocrine Tumor of the pancreas  . Diabetes mellitus without complication ([place])   . Family history of pancreatic cancer   . Fibroid uterus 2010  . Hypertension   . Irregular bleeding 2011  . 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 PONV (postoperative nausea and vomiting)    nausea  vomitting after 12/25/18 ERCP  . 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 Yeast vaginitis 2006    SURGICAL HISTORY: Past Surgical History:  Procedure Laterality Date  . BILIARY STENT PLACEMENT N/A 12/25/2018   Procedure: BILIARY STENT PLACEMENT;  Surgeon: Carol Ada, MD;  Location: [facility];  Service: Endoscopy;  Laterality: N/A;  . BILIARY STENT PLACEMENT N/A 01/01/2019   Procedure: BILIARY STENT PLACEMENT;  Surgeon: Carol Ada, MD;  Location: [facility];  Service: Endoscopy;  Laterality: N/A;  . DILATATION & CURETTAGE/HYSTEROSCOPY WITH TRUECLEAR N/A 01/20/2014   Procedure: DILATATION & CURETTAGE/HYSTEROSCOPY WITH TRUCLEAR;  Surgeon: [name], MD;  Location: [facility];  Service: Gynecology;  Laterality: N/A;  . DILATATION & CURRETTAGE/HYSTEROSCOPY WITH RESECTOCOPE N/A 01/20/2014   Procedure: [place];  Surgeon: [name], MD;  Location: [facility];  Service: Gynecology;  Laterality: N/A;  . ENDOSCOPIC RETROGRADE CHOLANGIOPANCREATOGRAPHY (ERCP) WITH PROPOFOL N/A 12/25/2018   Procedure: ENDOSCOPIC RETROGRADE CHOLANGIOPANCREATOGRAPHY (ERCP) WITH PROPOFOL;  Surgeon: Carol Ada, MD;  Location: [facility];  Service: Endoscopy;  Laterality: N/A;  . ENDOSCOPIC RETROGRADE CHOLANGIOPANCREATOGRAPHY (ERCP) WITH PROPOFOL N/A 01/01/2019   Procedure: ENDOSCOPIC RETROGRADE CHOLANGIOPANCREATOGRAPHY (ERCP) WITH PROPOFOL;  Surgeon: Carol Ada, MD;  Location: [facility];  Service: Endoscopy;  Laterality: N/A;  . ERCP  12/25/2018  . FINE NEEDLE ASPIRATION N/A 01/01/2019   Procedure: FINE NEEDLE ASPIRATION (FNA) LINEAR;  Surgeon: Carol Ada, MD;  Location: [facility];  Service: Endoscopy;  Laterality: N/A;  . LAPAROSCOPY N/A 03/04/2019  Procedure: LAPAROSCOPY DIAGNOSTIC;  Surgeon: Stark Klein, MD;  Location: [place];  Service: General;  Laterality: N/A;  GENERAL AND EPIDURAL  . NO PAST SURGERIES    . PARTIAL NEPHRECTOMY Right 03/04/2019   Procedure: Nephrectomy Partial;  Surgeon: Cleon Gustin,  MD;  Location: [place];  Service: Urology;  Laterality: Right;  . PORTACATH PLACEMENT Left 04/21/2019   Procedure: INSERTION PORT-A-CATH;  Surgeon: Stark Klein, MD;  Location: [place];  Service: General;  Laterality: Left;  . SPHINCTEROTOMY  12/25/2018   Procedure: SPHINCTEROTOMY;  Surgeon: Carol Ada, MD;  Location: [facility];  Service: Endoscopy;;  . Lavell Islam REMOVAL  01/01/2019   Procedure: STENT REMOVAL;  Surgeon: Carol Ada, MD;  Location: [facility];  Service: Endoscopy;;  . UPPER ESOPHAGEAL ENDOSCOPIC ULTRASOUND (EUS) N/A 01/01/2019   Procedure: UPPER ESOPHAGEAL ENDOSCOPIC ULTRASOUND (EUS);  Surgeon: Carol Ada, MD;  Location: [facility];  Service: Endoscopy;  Laterality: N/A;  . WHIPPLE PROCEDURE N/A 03/04/2019   Procedure: WHIPPLE PROCEDURE;  Surgeon: Stark Klein, MD;  Location: [place];  Service: General;  Laterality: N/A;  GENERAL AND EPIDURAL    I have reviewed the social history and family history with the patient and they are unchanged from previous note.  ALLERGIES:  is allergic to cherry and lemon oil.  MEDICATIONS:  Current Outpatient Medications  Medication Sig Dispense Refill  . Continuous Blood Gluc Receiver (FREESTYLE LIBRE 14 DAY READER) DEVI USE READER TO CHECK BLOOD GLUCOSE WITH FREESTYLE LIBRE SENSORS. 1 Device 0  . Continuous Blood Gluc Sensor (FREESTYLE LIBRE 14 DAY SENSOR) MISC APPLY SENSOR TO BODY ONCE EVERY 14 DAYS TO MONITOR BLOOD GLUCOSE 4 each 3  . glucose blood test strip Check sugar 2 times daily. 100 each 12  . HUMALOG MIX 75/25 KWIKPEN (75-25) 100 UNIT/ML Kwikpen Inject 24 units under the skin once daily before lunch. (Patient taking differently: Inject 24 Units into the skin. Inject 20 units under the skin once daily before lunch.) 30 mL 2  . Insulin Degludec (TRESIBA FLEXTOUCH [place]) Inject 12 Units into the skin at bedtime.     . insulin lispro (HUMALOG KWIKPEN) 100 UNIT/ML KwikPen Inject 20 units under the skin three times daily before meals. 60  pen 3  . lidocaine-prilocaine (EMLA) cream Apply 1 application topically as directed. Apply 1 hour prior to stick and cover with plastic wrap 30 g 5  . lisinopril-hydrochlorothiazide (ZESTORETIC) 10-12.5 MG tablet TAKE 1 TABLET DAILY 90 tablet 3  . loratadine (CLARITIN) 10 MG tablet Take 10 mg by mouth at bedtime.     Marland Kitchen MAGNESIUM-OXIDE 400 (241.3 Mg) MG tablet TAKE 1 TABLET(400 MG) BY MOUTH TWICE DAILY 60 tablet 2  . metFORMIN (GLUCOPHAGE-XR) 500 MG 24 hr tablet Take 4 tablets (2046m total) by mouth once daily. 360 tablet 1  . NOVOFINE PLUS 32G X 4 MM MISC USE TO INJECT INSULIN THREE TIMES DAILY 300 each 1  . potassium chloride SA (KLOR-CON M20) 20 MEQ tablet Take 1 tablet (20 mEq total) by mouth 2 (two) times daily. 60 tablet 3  . rosuvastatin (CRESTOR) 10 MG tablet Take 10 mg by mouth daily.     No current facility-administered medications for this visit.   Facility-Administered Medications Ordered in Other Visits  Medication Dose Route Frequency Provider Last Rate Last Admin  . sodium chloride flush (NS) 0.9 % injection 10 mL  10 mL Intracatheter PRN SLadell Pier MD   10 mL at 05/27/19 1342    PHYSICAL EXAMINATION:  Vitals:  04/04/20 1401  BP: 108/78  Pulse: 98  Resp: 17  Temp: (!) 97 F (36.1 C)  SpO2: 100%   Filed Weights   04/04/20 1401  Weight: 200 lb 6.4 oz (90.9 kg)    GENERAL:alert, no distress and comfortable SKIN: no rash, juandice EYES: sclera clear NECK: without mass LUNGS:  normal breathing effort HEART:  no lower extremity edema ABDOMEN:abdomen soft, normal bowel sounds. No hepatomegaly or mass. Mild ttp in lower abdomen  NEURO: alert & oriented x 3 with fluent speech  LABORATORY DATA:  I have reviewed the data as listed CBC Latest Ref Rng & Units 02/07/2020 11/29/2019 10/18/2019  WBC 4.0 - 10.5 K/uL 3.4(L) 3.1(L) 4.1  Hemoglobin 12.0 - 15.0 g/dL 10.7(L) 9.6(L) 10.6(L)  Hematocrit 36 - 46 % 32.0(L) 29.2(L) 32.1(L)  Platelets 150 - 400 K/uL 352 329  283     CMP Latest Ref Rng & Units 03/21/2020 03/09/2020 02/07/2020  Glucose 70 - 99 mg/dL 74 234(H) 113(H)  BUN 6 - 20 mg/dL 9 8 4(L)  Creatinine 0.44 - 1.00 mg/dL 1.11(H) 1.14 1.02(H)  Sodium 135 - 145 mmol/L 142 139 141  Potassium 3.5 - 5.1 mmol/L 3.4(L) 3.6 3.3(L)  Chloride 98 - 111 mmol/L 110 105 106  CO2 22 - 32 mmol/L 29 27 28   Calcium 8.9 - 10.3 mg/dL 9.5 9.2 10.2  Total Protein 6.5 - 8.1 g/dL 7.2 - 7.0  Total Bilirubin 0.3 - 1.2 mg/dL 0.4 - 0.4  Alkaline Phos 38 - 126 U/L 123 - 133(H)  AST 15 - 41 U/L 40 - 36  ALT 0 - 44 U/L 39 - 34      RADIOGRAPHIC STUDIES: I have personally reviewed the radiological images as listed and agreed with the findings in the report. No results found.   ASSESSMENT & PLAN:   1. 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 Tumor invades the duodenal wall and vascular groove, resection margins negative, 4/34 lymph nodes positive ? MSI-stable, tumor showed instability in 2 loci as did adjacent normal tissue ? 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 CTs 01/29/2019-ill-defined pancreas head mass, 5 pulmonary nodules-1 with a small amount of central cavitation, tumor abuts the left margin of the portal vein indistinct density surrounding, hepatic artery, complex cystic lesion of the right kidney, right adrenal mass-characterized as an adenoma on a [facility] MRI 12/21/2018 ? 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 Elevated preoperative CA 19-9--CA 19-9 186 on 01/18/2019 ? CT chest 04/16/2019-multiple bilateral pulmonary nodules, some with increased cavitation, stable in size ? Cycle 1 FOLFIRINOX 04/27/2019 ? Cycle 2 FOLFIRINOX 05/11/2019 ? Cycle 3 FOLFIRINOX 05/23/2019 ? Cycle 4 FOLFIRINOX 06/08/2019 ? Cycle 5 FOLFIRINOX  06/22/2019 ? CT chest 07/02/2019-stable size of bilateral pulmonary nodules. Dominant cavitary lesions in both lungs show increased cavitation with thinner walls. Stable 2.1 cm right adrenal nodule. ? Cycle 6 FOLFIRINOX 07/06/2019 ? Cycle 7 FOLFIRINOX 07/21/2019 ? Cycle 8 FOLFIRINOX 08/03/2019, oxaliplatin deleted secondary to neuropathy ? CT chest 08/24/2019-decreased size of several lung nodules with resolution of a left upper lobe nodule, no new nodules ? Radiationto the pancreas surgical area with concurrent Xeloda 09/13/2019-10/20/2019 ? CTs 11/29/2019-multiple small pulmonary nodules scattered throughout the lungs bilaterally, appear increased in number and size. No definite evidence of metastatic disease in the abdomen or pelvis. Markedly enlarged and heterogeneous appearing uterus, likely to represent  multifocal fibroids. 1 of these lesions appears to be an exophytic subserosal fibroid in the posterior lateral aspect of the uterine body on the left side although this comes in close proximity to the left adnexa such that a primary ovarian lesion is difficult to completely exclude. ? CTs 02/07/2020-slight enlargement of bilateral lung nodules, some are cavitary, no evidence of metastatic disease in the abdomen or pelvis, stable right adrenal nodule, uterine fibroids  2. 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 Diabetes 4. Hypertension 5. Family history of pancreas cancer, INVITAE panel-VUS in the TERT 6. Port-A-Cath placement, Dr. 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 Mild lower abdominal pain after exercise, likely MSK related (04/04/20)  Disposition:   Ms. Landa appears stable.  She developed acute onset abdominal pain after exercise.  The pain is improving with ibuprofen.  She can also use heat as needed.  She has no other changes in bowel habits, weight loss, unexplained fatigue, or other new concerns.  She is mildly tender on  exam which is consistent with MSK etiology.  I do not feel this is related to her history of pancreas cancer.  We will continue with the surveillance plan which includes lab and CT on 11/17 and follow-up with Dr. Benay Spice on 11/19.  She knows to call sooner if she has any new or worsening concerns or the pain does not improve as expected.  The case was discussed with Dr. Benay Spice.  All questions were answered. No barriers to learning were detected.     Alla Feeling, NP 04/04/20

## 2020-04-06 ENCOUNTER — Telehealth: Payer: Self-pay | Admitting: Nurse Practitioner

## 2020-04-06 ENCOUNTER — Telehealth: Payer: Self-pay | Admitting: *Deleted

## 2020-04-06 NOTE — Telephone Encounter (Signed)
CT scan scheduled for [facility] on 04/26/20 at 11:15/11:30. Contrast at 0930 and 1030. NPO 4 hours prior. Pick up contrast anytime before that day. Patient notified.

## 2020-04-06 NOTE — Telephone Encounter (Signed)
No 10/26 los

## 2020-04-17 DIAGNOSIS — C250 Malignant neoplasm of head of pancreas: Secondary | ICD-10-CM | POA: Diagnosis not present

## 2020-04-17 DIAGNOSIS — R1031 Right lower quadrant pain: Secondary | ICD-10-CM | POA: Diagnosis not present

## 2020-04-19 ENCOUNTER — Other Ambulatory Visit: Payer: Self-pay | Admitting: Endocrinology

## 2020-04-25 LAB — HM PAP SMEAR

## 2020-04-26 ENCOUNTER — Inpatient Hospital Stay: Payer: BC Managed Care – PPO

## 2020-04-26 ENCOUNTER — Inpatient Hospital Stay: Payer: BC Managed Care – PPO | Attending: Genetic Counselor

## 2020-04-26 ENCOUNTER — Ambulatory Visit (HOSPITAL_COMMUNITY)
Admission: RE | Admit: 2020-04-26 | Discharge: 2020-04-26 | Disposition: A | Discharge status: Home / Self Care | Payer: BC Managed Care – PPO | Source: Ambulatory Visit | Attending: Nurse Practitioner | Admitting: Nurse Practitioner

## 2020-04-26 ENCOUNTER — Other Ambulatory Visit: Payer: Self-pay

## 2020-04-26 DIAGNOSIS — E119 Type 2 diabetes mellitus without complications: Secondary | ICD-10-CM | POA: Diagnosis not present

## 2020-04-26 DIAGNOSIS — I10 Essential (primary) hypertension: Secondary | ICD-10-CM | POA: Diagnosis not present

## 2020-04-26 DIAGNOSIS — C259 Malignant neoplasm of pancreas, unspecified: Secondary | ICD-10-CM | POA: Insufficient documentation

## 2020-04-26 DIAGNOSIS — Z95828 Presence of other vascular implants and grafts: Secondary | ICD-10-CM

## 2020-04-26 DIAGNOSIS — C250 Malignant neoplasm of head of pancreas: Secondary | ICD-10-CM | POA: Insufficient documentation

## 2020-04-26 DIAGNOSIS — R1030 Lower abdominal pain, unspecified: Secondary | ICD-10-CM | POA: Insufficient documentation

## 2020-04-26 DIAGNOSIS — Z85528 Personal history of other malignant neoplasm of kidney: Secondary | ICD-10-CM | POA: Insufficient documentation

## 2020-04-26 DIAGNOSIS — Z79899 Other long term (current) drug therapy: Secondary | ICD-10-CM | POA: Diagnosis not present

## 2020-04-26 DIAGNOSIS — R609 Edema, unspecified: Secondary | ICD-10-CM | POA: Diagnosis not present

## 2020-04-26 DIAGNOSIS — Z9049 Acquired absence of other specified parts of digestive tract: Secondary | ICD-10-CM | POA: Diagnosis not present

## 2020-04-26 DIAGNOSIS — J984 Other disorders of lung: Secondary | ICD-10-CM | POA: Insufficient documentation

## 2020-04-26 DIAGNOSIS — R1900 Intra-abdominal and pelvic swelling, mass and lump, unspecified site: Secondary | ICD-10-CM | POA: Insufficient documentation

## 2020-04-26 DIAGNOSIS — C7800 Secondary malignant neoplasm of unspecified lung: Secondary | ICD-10-CM | POA: Diagnosis not present

## 2020-04-26 DIAGNOSIS — Z90411 Acquired partial absence of pancreas: Secondary | ICD-10-CM | POA: Diagnosis not present

## 2020-04-26 DIAGNOSIS — K760 Fatty (change of) liver, not elsewhere classified: Secondary | ICD-10-CM | POA: Insufficient documentation

## 2020-04-26 DIAGNOSIS — R918 Other nonspecific abnormal finding of lung field: Secondary | ICD-10-CM | POA: Diagnosis not present

## 2020-04-26 DIAGNOSIS — I700 Atherosclerosis of aorta: Secondary | ICD-10-CM | POA: Diagnosis not present

## 2020-04-26 DIAGNOSIS — Z905 Acquired absence of kidney: Secondary | ICD-10-CM | POA: Diagnosis not present

## 2020-04-26 DIAGNOSIS — D259 Leiomyoma of uterus, unspecified: Secondary | ICD-10-CM | POA: Diagnosis not present

## 2020-04-26 LAB — CMP (CANCER CENTER ONLY)
ALT: 21 U/L (ref 0–44)
AST: 45 U/L — ABNORMAL HIGH (ref 15–41)
Albumin: 3 g/dL — ABNORMAL LOW (ref 3.5–5.0)
Alkaline Phosphatase: 118 U/L (ref 38–126)
Anion gap: 7 (ref 5–15)
BUN: 7 mg/dL (ref 6–20)
CO2: 30 mmol/L (ref 22–32)
Calcium: 9 mg/dL (ref 8.9–10.3)
Chloride: 104 mmol/L (ref 98–111)
Creatinine: 1.05 mg/dL — ABNORMAL HIGH (ref 0.44–1.00)
GFR, Estimated: >60 mL/min (ref >60)
Glucose, Bld: 210 mg/dL — ABNORMAL HIGH (ref 70–99)
Potassium: 4.6 mmol/L (ref 3.5–5.1)
Sodium: 141 mmol/L (ref 135–145)
Total Bilirubin: 0.4 mg/dL (ref 0.3–1.2)
Total Protein: 7.5 g/dL (ref 6.5–8.1)

## 2020-04-26 LAB — MAGNESIUM: Magnesium: 1.7 mg/dL (ref 1.7–2.4)

## 2020-04-26 MED ORDER — SODIUM CHLORIDE 0.9% FLUSH
10.0000 mL | Freq: Once | INTRAVENOUS | Status: AC
  Administered 2020-04-26: 10 mL
  Filled 2020-04-26: qty 10

## 2020-04-26 MED ORDER — ALTEPLASE 2 MG IJ SOLR
2.0000 mg | Freq: Once | INTRAMUSCULAR | Status: AC
  Administered 2020-04-26: 2 mg
  Filled 2020-04-26: qty 2

## 2020-04-26 MED ORDER — IOHEXOL 300 MG/ML  SOLN
100.0000 mL | Freq: Once | INTRAMUSCULAR | Status: AC | PRN
  Administered 2020-04-26: 100 mL via INTRAVENOUS

## 2020-04-26 MED ORDER — ALTEPLASE 2 MG IJ SOLR
INTRAMUSCULAR | Status: AC
  Filled 2020-04-26: qty 2

## 2020-04-26 MED ORDER — HEPARIN SOD (PORK) LOCK FLUSH 100 UNIT/ML IV SOLN
500.0000 [IU] | Freq: Once | INTRAVENOUS | Status: AC
  Administered 2020-04-26: 500 [IU]
  Filled 2020-04-26: qty 5

## 2020-04-27 LAB — CANCER ANTIGEN 19-9: CA 19-9: 14 U/mL (ref 0–35)

## 2020-04-28 ENCOUNTER — Other Ambulatory Visit: Payer: Self-pay

## 2020-04-28 ENCOUNTER — Encounter: Payer: Self-pay | Admitting: Internal Medicine

## 2020-04-28 ENCOUNTER — Inpatient Hospital Stay (HOSPITAL_BASED_OUTPATIENT_CLINIC_OR_DEPARTMENT_OTHER): Payer: BC Managed Care – PPO | Admitting: Oncology

## 2020-04-28 VITALS — BP 138/92 | HR 100 | Temp 98.4°F | Resp 16 | Ht 68.5 in | Wt 192.9 lb

## 2020-04-28 DIAGNOSIS — Z9049 Acquired absence of other specified parts of digestive tract: Secondary | ICD-10-CM | POA: Diagnosis not present

## 2020-04-28 DIAGNOSIS — Z85528 Personal history of other malignant neoplasm of kidney: Secondary | ICD-10-CM | POA: Diagnosis not present

## 2020-04-28 DIAGNOSIS — I700 Atherosclerosis of aorta: Secondary | ICD-10-CM | POA: Diagnosis not present

## 2020-04-28 DIAGNOSIS — R609 Edema, unspecified: Secondary | ICD-10-CM | POA: Diagnosis not present

## 2020-04-28 DIAGNOSIS — K760 Fatty (change of) liver, not elsewhere classified: Secondary | ICD-10-CM | POA: Diagnosis not present

## 2020-04-28 DIAGNOSIS — Z79899 Other long term (current) drug therapy: Secondary | ICD-10-CM | POA: Diagnosis not present

## 2020-04-28 DIAGNOSIS — C250 Malignant neoplasm of head of pancreas: Secondary | ICD-10-CM

## 2020-04-28 DIAGNOSIS — I10 Essential (primary) hypertension: Secondary | ICD-10-CM | POA: Diagnosis not present

## 2020-04-28 DIAGNOSIS — Z905 Acquired absence of kidney: Secondary | ICD-10-CM | POA: Diagnosis not present

## 2020-04-28 DIAGNOSIS — Z90411 Acquired partial absence of pancreas: Secondary | ICD-10-CM | POA: Diagnosis not present

## 2020-04-28 DIAGNOSIS — C259 Malignant neoplasm of pancreas, unspecified: Secondary | ICD-10-CM | POA: Diagnosis not present

## 2020-04-28 DIAGNOSIS — D259 Leiomyoma of uterus, unspecified: Secondary | ICD-10-CM | POA: Diagnosis not present

## 2020-04-28 DIAGNOSIS — C7800 Secondary malignant neoplasm of unspecified lung: Secondary | ICD-10-CM | POA: Diagnosis not present

## 2020-04-28 DIAGNOSIS — E119 Type 2 diabetes mellitus without complications: Secondary | ICD-10-CM | POA: Diagnosis not present

## 2020-04-28 DIAGNOSIS — R1900 Intra-abdominal and pelvic swelling, mass and lump, unspecified site: Secondary | ICD-10-CM | POA: Diagnosis not present

## 2020-04-28 DIAGNOSIS — R1030 Lower abdominal pain, unspecified: Secondary | ICD-10-CM | POA: Diagnosis not present

## 2020-04-28 DIAGNOSIS — J984 Other disorders of lung: Secondary | ICD-10-CM | POA: Diagnosis not present

## 2020-04-28 NOTE — Progress Notes (Signed)
[name] OFFICE PROGRESS NOTE   Diagnosis: Pancreas cancer  INTERVAL HISTORY:   Alyssa Ross returns as scheduled.  Alyssa Ross is here with Alyssa Ross.  Alyssa Ross feels well.  Alyssa Ross had an episode of acute right lower abdominal pain last month after exercising.  The pain resolved.  The pain has not returned.  Objective:  Vital signs in last 24 hours:  Blood pressure (!) 138/92, pulse 100, temperature 98.4 F (36.9 C), temperature source Tympanic, resp. rate 16, height 5' 8.5" (1.74 m), weight 192 lb 14.4 oz (87.5 kg), last menstrual period 09/17/2012, SpO2 100 %.    Resp: Lungs clear bilaterally Cardio: Regular rate and rhythm GI: No hepatosplenomegaly, no mass, nontender Vascular: No leg edema   Portacath/PICC-without erythema  Lab Results:  Lab Results  Component Value Date   WBC 3.4 (L) 02/07/2020   HGB 10.7 (L) 02/07/2020   HCT 32.0 (L) 02/07/2020   MCV 91.4 02/07/2020   PLT 352 02/07/2020   NEUTROABS 1.8 02/07/2020    CMP  Lab Results  Component Value Date   NA 141 04/26/2020   K 4.6 04/26/2020   CL 104 04/26/2020   CO2 30 04/26/2020   GLUCOSE 210 (H) 04/26/2020   BUN 7 04/26/2020   CREATININE 1.05 (H) 04/26/2020   CALCIUM 9.0 04/26/2020   PROT 7.5 04/26/2020   ALBUMIN 3.0 (L) 04/26/2020   AST 45 (H) 04/26/2020   ALT 21 04/26/2020   ALKPHOS 118 04/26/2020   BILITOT 0.4 04/26/2020   GFRNONAA >60 04/26/2020   GFRAA >60 02/07/2020     Imaging:  CT Chest W Contrast  Result Date: 04/27/2020 CLINICAL DATA:  Pancreatic cancer, status post Whipple procedure. Chemotherapy complete. Asymptomatic. EXAM: CT CHEST, ABDOMEN, AND PELVIS WITH CONTRAST TECHNIQUE: Multidetector CT imaging of the chest, abdomen and pelvis was performed following the standard protocol during bolus administration of intravenous contrast. CONTRAST:  169m OMNIPAQUE IOHEXOL 300 MG/ML  SOLN COMPARISON:  02/07/2020 FINDINGS: CT CHEST FINDINGS Cardiovascular: Left Port-A-Cath tip at low  SVC. Aortic atherosclerosis. Normal heart size, without pericardial effusion. No central pulmonary embolism, on this non-dedicated study. Mediastinum/Nodes: No supraclavicular adenopathy. No mediastinal or hilar adenopathy. Lungs/Pleura: No pleural fluid. Bilateral pulmonary nodules consistent with metastatic disease. An inferior right upper lobe cavitary nodule measures 1.3 cm on 62/12 and has enlarged from 11 mm on the prior exam (when remeasured). Central left lower lobe solid pulmonary nodule measures 12 x 11 mm on 51/12 and has enlarged compared to a 9 mm cavitary nodule on the prior exam. Right lower lobe 7 mm nodule on 71/2 has enlarged from 3 mm on the prior exam (when remeasured). Musculoskeletal: No acute osseous abnormality. CT ABDOMEN PELVIS FINDINGS Hepatobiliary: Moderate hepatic steatosis. No focal liver lesion. Pneumobilia. Choledochojejunostomy. Pancreas: Status post pancreatic head resection and pancreatic to jejunal anastomosis. Pancreatic stent is unchanged in position. No duct dilatation or local recurrence. Chronic interstitial thickening throughout the anterior pararenal space is similar, mild, and may be treatment related. Spleen: Normal in size, without focal abnormality. Adrenals/Urinary Tract: 2.2 cm right adrenal nodule on 81/7 is similar in size to on the prior exam (when remeasured). Normal left adrenal gland, kidneys, and urinary bladder. Stomach/Bowel: Gastrojejunostomy. Normal colon and terminal ileum. Otherwise normal small bowel. Vascular/Lymphatic: Aortic atherosclerosis. No abdominopelvic adenopathy. Reproductive: Marked fibroid uterus, with a similar exophytic left pelvic mass of 5.4 cm. Other: Trace pelvic fluid, slightly increased. Soft tissue thickening inferior to the cecal tip on 144/7 is new, suspicious for peritoneal  metastasis. Example at maximally 3.2 cm. Also coronal image 40/8.Felt to be separate from suboptimally opacified distal small bowel loops. Soft tissue  thickening in the porta hepatis and surrounding the caudate on 81/7 is similar to on the prior. Musculoskeletal: Subcutaneous edema within the anterior abdominal wall is likely iatrogenic. Degenerative partial fusion of bilateral sacroiliac joints. IMPRESSION: CT CHEST IMPRESSION 1. Mild progression of pulmonary metastasis compared 02/07/2020. 2. No thoracic adenopathy. 3.  Aortic Atherosclerosis (ICD10-I70.0). CT ABDOMEN AND PELVIS IMPRESSION 1. Status post Whipple procedure, without locally recurrent disease. 2. Developing soft tissue thickening caudal to the cecum for which peritoneal/serosal metastasis are a concern. Recommend attention on follow-up. 3. Similar right adrenal nodule, favoring an adenoma. 4. Similar soft tissue thickening within the porta hepatis, favored to be treatment related. 5. Fibroid uterus. 6. Hepatic steatosis. Electronically Signed   By: Abigail Miyamoto M.D.   On: 04/27/2020 09:25   CT Abdomen Pelvis W Contrast  Result Date: 04/27/2020 CLINICAL DATA:  Pancreatic cancer, status post Whipple procedure. Chemotherapy complete. Asymptomatic. EXAM: CT CHEST, ABDOMEN, AND PELVIS WITH CONTRAST TECHNIQUE: Multidetector CT imaging of the chest, abdomen and pelvis was performed following the standard protocol during bolus administration of intravenous contrast. CONTRAST:  175m OMNIPAQUE IOHEXOL 300 MG/ML  SOLN COMPARISON:  02/07/2020 FINDINGS: CT CHEST FINDINGS Cardiovascular: Left Port-A-Cath tip at low SVC. Aortic atherosclerosis. Normal heart size, without pericardial effusion. No central pulmonary embolism, on this non-dedicated study. Mediastinum/Nodes: No supraclavicular adenopathy. No mediastinal or hilar adenopathy. Lungs/Pleura: No pleural fluid. Bilateral pulmonary nodules consistent with metastatic disease. An inferior right upper lobe cavitary nodule measures 1.3 cm on 62/12 and has enlarged from 11 mm on the prior exam (when remeasured). Central left lower lobe solid pulmonary  nodule measures 12 x 11 mm on 51/12 and has enlarged compared to a 9 mm cavitary nodule on the prior exam. Right lower lobe 7 mm nodule on 71/2 has enlarged from 3 mm on the prior exam (when remeasured). Musculoskeletal: No acute osseous abnormality. CT ABDOMEN PELVIS FINDINGS Hepatobiliary: Moderate hepatic steatosis. No focal liver lesion. Pneumobilia. Choledochojejunostomy. Pancreas: Status post pancreatic head resection and pancreatic to jejunal anastomosis. Pancreatic stent is unchanged in position. No duct dilatation or local recurrence. Chronic interstitial thickening throughout the anterior pararenal space is similar, mild, and may be treatment related. Spleen: Normal in size, without focal abnormality. Adrenals/Urinary Tract: 2.2 cm right adrenal nodule on 81/7 is similar in size to on the prior exam (when remeasured). Normal left adrenal gland, kidneys, and urinary bladder. Stomach/Bowel: Gastrojejunostomy. Normal colon and terminal ileum. Otherwise normal small bowel. Vascular/Lymphatic: Aortic atherosclerosis. No abdominopelvic adenopathy. Reproductive: Marked fibroid uterus, with a similar exophytic left pelvic mass of 5.4 cm. Other: Trace pelvic fluid, slightly increased. Soft tissue thickening inferior to the cecal tip on 144/7 is new, suspicious for peritoneal metastasis. Example at maximally 3.2 cm. Also coronal image 40/8.Felt to be separate from suboptimally opacified distal small bowel loops. Soft tissue thickening in the porta hepatis and surrounding the caudate on 81/7 is similar to on the prior. Musculoskeletal: Subcutaneous edema within the anterior abdominal wall is likely iatrogenic. Degenerative partial fusion of bilateral sacroiliac joints. IMPRESSION: CT CHEST IMPRESSION 1. Mild progression of pulmonary metastasis compared 02/07/2020. 2. No thoracic adenopathy. 3.  Aortic Atherosclerosis (ICD10-I70.0). CT ABDOMEN AND PELVIS IMPRESSION 1. Status post Whipple procedure, without locally  recurrent disease. 2. Developing soft tissue thickening caudal to the cecum for which peritoneal/serosal metastasis are a concern. Recommend attention on follow-up. 3. Similar right  adrenal nodule, favoring an adenoma. 4. Similar soft tissue thickening within the porta hepatis, favored to be treatment related. 5. Fibroid uterus. 6. Hepatic steatosis. Electronically Signed   By: Abigail Miyamoto M.D.   On: 04/27/2020 09:25    Medications: I have reviewed the patient's current medications.   Assessment/Plan: 1. 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 Tumor invades the duodenal wall and vascular groove, resection margins negative, 4/34 lymph nodes positive ? MSI-stable, tumor showed instability in 2 loci as did adjacent normal tissue ? 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 CTs 01/29/2019-ill-defined pancreas head mass, 5 pulmonary nodules-1 with a small amount of central cavitation, tumor abuts the left margin of the portal vein indistinct density surrounding, hepatic artery, complex cystic lesion of the right kidney, right adrenal mass-characterized as an adenoma on a [facility] MRI 12/21/2018 ? 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 Elevated preoperative CA 19-9--CA 19-9 186 on 01/18/2019 ? CT chest 04/16/2019-multiple bilateral pulmonary nodules, some with increased cavitation, stable in size ? Cycle 1 FOLFIRINOX 04/27/2019 ? Cycle 2 FOLFIRINOX 05/11/2019 ? Cycle 3 FOLFIRINOX 05/23/2019 ? Cycle 4 FOLFIRINOX 06/08/2019 ? Cycle 5 FOLFIRINOX 06/22/2019 ? CT chest 07/02/2019-stable size of bilateral pulmonary nodules. Dominant cavitary lesions in both lungs show increased cavitation with thinner walls. Stable 2.1 cm right adrenal nodule. ? Cycle 6 FOLFIRINOX  07/06/2019 ? Cycle 7 FOLFIRINOX 07/21/2019 ? Cycle 8 FOLFIRINOX 08/03/2019, oxaliplatin deleted secondary to neuropathy ? CT chest 08/24/2019-decreased size of several lung nodules with resolution of a left upper lobe nodule, no new nodules ? Radiationto the pancreas surgical area with concurrent Xeloda 09/13/2019-10/20/2019 ? CTs 11/29/2019-multiple small pulmonary nodules scattered throughout the lungs bilaterally, appear increased in number and size. No definite evidence of metastatic disease in the abdomen or pelvis. Markedly enlarged and heterogeneous appearing uterus, likely to represent multifocal fibroids. 1 of these lesions appears to be an exophytic subserosal fibroid in the posterior lateral aspect of the uterine body on the left side although this comes in close proximity to the left adnexa such that a primary ovarian lesion is difficult to completely exclude. ? CTs 02/07/2020-slight enlargement of bilateral lung nodules, some are cavitary, no evidence of metastatic disease in the abdomen or pelvis, stable right adrenal nodule, uterine fibroids ? CTs 04/26/2020-mild enlargement of pulmonary nodules, slight increase in trace pelvic fluid, new soft tissue thickening inferior to the cecal tip suspicious for peritoneal metastasis  2. 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 Diabetes 4. Hypertension 5. Family history of pancreas cancer, INVITAE panel-VUS in the TERT 6. Port-A-Cath placement, Dr. 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 Mild lower abdominal pain after exercise, likely MSK related (04/04/20)    Disposition: Alyssa Ross has a history of pancreas cancer.  I reviewed the CT findings and images with Alyssa Ross.  The CT findings are most consistent with progressive metastatic pancreas cancer involving bilateral lung nodules and potentially a pelvic soft tissue mass. Alyssa Ross would like to proceed with a diagnostic biopsy to confirm a  diagnosis of metastatic disease.  I will refer Alyssa Ross for a CT-guided biopsy of the pelvic nodule.  We had a preliminary discussion regarding systemic treatment options including treatment with gemcitabine/Abraxane.  Alyssa Ross will return for an office visit after the diagnostic biopsy.  I will present Alyssa Ross case at the GI tumor conference.  [name],  MD  04/28/2020  10:52 AM

## 2020-05-01 ENCOUNTER — Encounter (HOSPITAL_COMMUNITY): Payer: Self-pay

## 2020-05-01 ENCOUNTER — Telehealth: Payer: Self-pay | Admitting: Oncology

## 2020-05-01 ENCOUNTER — Encounter: Payer: Self-pay | Admitting: Oncology

## 2020-05-01 NOTE — Telephone Encounter (Signed)
Scheduled appointment per 11/19 los. Spoke to patient who is aware of appointment date and time.  

## 2020-05-01 NOTE — Progress Notes (Signed)
BC 1  Alyssa Ross Female, 53 y.o., [birth year] MRN:  [id number] Phone:  [phone] [name]) PCP:  Glendale Chard, MD Coverage:  Sherre Poot Blue [employer] Comm Ppo Next Appt With Internal Medicine 05/12/2020 at 8:30 AM  RE: Biopsy Received: Today Message Details  Suttle, Rosanne Ashing, MD  Leanna Battles Ir Procedure Requests My apologies, Dr. Benay Spice, there were additional series in the study I didn't recognize initially that include the area of interest.    Biopsying this soft tissue would be very high risk for bowel perforation. It is confluent with the base of the cecum which shows circumferential thickening. In addition to this finding, there is also an approximately 1.5 cm cystic structure along the mesenteric aspect of the terminal ileum which appears thickened compared to prior. This appears more of an inflammatory etiology to me.    I recommend against very high risk biopsy at this time and obtaining a repeat abdomen/pelvis CT with oral and IV contrast in 1 month, then reconsider biopsy at that time pending results.   Thanks,   Dylan   Previous Messages  ----- Message -----  From: Lenore Cordia  Sent: 05/01/2020  7:23 AM EST  To: Ir Procedure Requests  Subject: FW: Biopsy                     ----- Message -----  From: Ladell Pier, MD  Sent: 04/28/2020  4:59 PM EST  To: Lenore Cordia  Subject: RE: Biopsy                    She just had a CT abdomen/pelvis this week, Whipple was over a year ago  ----- Message -----  From: Lenore Cordia  Sent: 04/28/2020  4:48 PM EST  To: Ladell Pier, MD  Subject: FW: Biopsy                    Please see Dr. Malachi Carl response.  ----- Message -----  From: Suzette Battiest, MD  Sent: 04/28/2020  4:27 PM EST  To: Lenore Cordia  Subject: RE: Biopsy                    Incomplete evaluation of infracecal soft  tissue prominence on abdomen CT. Recommend dedicated CT abdomen and pelvis for complete characterization. May consider waiting longer if Whipple was very recent as this may represent post-surgical changes.    Dylan  ----- Message -----  From: Lenore Cordia  Sent: 04/28/2020  4:05 PM EST  To: Ir Procedure Requests  Subject: Biopsy                      Procedure Requested: CT Biopsy    Reason for Procedure: pancreas cancer, new pelvic soft tissue mass    Provider Requesting: Dr Benay Spice  Provider Telephone: [phone]    Other Info:

## 2020-05-03 ENCOUNTER — Encounter: Payer: Self-pay | Admitting: *Deleted

## 2020-05-05 ENCOUNTER — Other Ambulatory Visit: Payer: Self-pay | Admitting: Endocrinology

## 2020-05-11 ENCOUNTER — Other Ambulatory Visit: Payer: Self-pay | Admitting: Endocrinology

## 2020-05-12 ENCOUNTER — Encounter: Payer: Self-pay | Admitting: *Deleted

## 2020-05-12 ENCOUNTER — Telehealth: Payer: Self-pay | Admitting: *Deleted

## 2020-05-12 ENCOUNTER — Encounter: Payer: Self-pay | Admitting: Endocrinology

## 2020-05-12 ENCOUNTER — Other Ambulatory Visit: Payer: Self-pay

## 2020-05-12 ENCOUNTER — Other Ambulatory Visit (INDEPENDENT_AMBULATORY_CARE_PROVIDER_SITE_OTHER): Payer: BC Managed Care – PPO

## 2020-05-12 DIAGNOSIS — E1165 Type 2 diabetes mellitus with hyperglycemia: Secondary | ICD-10-CM | POA: Diagnosis not present

## 2020-05-12 DIAGNOSIS — Z794 Long term (current) use of insulin: Secondary | ICD-10-CM

## 2020-05-12 LAB — BASIC METABOLIC PANEL
BUN: 10 mg/dL (ref 6–23)
CO2: 28 mEq/L (ref 19–32)
Calcium: 9.4 mg/dL (ref 8.4–10.5)
Chloride: 105 mEq/L (ref 96–112)
Creatinine, Ser: 1.07 mg/dL (ref 0.40–1.20)
GFR: 59.01 mL/min — ABNORMAL LOW (ref >60.00)
Glucose, Bld: 94 mg/dL (ref 70–99)
Potassium: 3.3 mEq/L — ABNORMAL LOW (ref 3.5–5.1)
Sodium: 141 mEq/L (ref 135–145)

## 2020-05-12 NOTE — Telephone Encounter (Signed)
Dr. Benay Spice was able to review CT films today w/interventinal radiologist and if CT biopsy was done with oral contrast to better outline the bowel, the biopsy can be done. Need to reschedule OV few days after biopsy. Notified patient via MyChart message. Called central scheduling and left VM for Keri to start working on this again per IR physician and call patient.

## 2020-05-13 LAB — FRUCTOSAMINE: Fructosamine: 349 umol/L — ABNORMAL HIGH (ref 0–285)

## 2020-05-17 ENCOUNTER — Inpatient Hospital Stay: Payer: BC Managed Care – PPO | Admitting: Nurse Practitioner

## 2020-05-17 ENCOUNTER — Encounter: Payer: Self-pay | Admitting: *Deleted

## 2020-05-17 ENCOUNTER — Inpatient Hospital Stay: Payer: BC Managed Care – PPO

## 2020-05-17 ENCOUNTER — Encounter (HOSPITAL_COMMUNITY): Payer: Self-pay

## 2020-05-17 NOTE — Progress Notes (Unsigned)
[place] Female, 54 y.o., 1966/04/06 MRN:  [id number] Phone:  [phone] [name]) PCP:  Glendale Chard, MD Coverage:  Sherre Poot Blue [employer] Comm Ppo Next Appt With Radiology ([facility]-CT 3) 05/26/2020 at 11:00 AM  RE: Biopsy Received: Yesterday Message Details  Aletta Edouard, MD  Lenore Cordia You can put on CT biopsy w/ me. She needs oral contrast prior to procedure so that has to be worked out w/ CT.   GY   Previous Messages  ----- Message -----  From: Lenore Cordia  Sent: 05/16/2020 11:12 AM EST  To: Aletta Edouard, MD  Subject: RE: Biopsy                    Hello Dr Kathlene Cote   Dr Benay Spice stated he spoke with you and you gave a different  recommendation.   Can you please send me your review on patient Paz Roderic Ovens  ----- Message -----  From: Suzette Battiest, MD  Sent: 05/01/2020  7:39 AM EST  To: Lenore Cordia, Ir Procedure Requests  Subject: RE: Biopsy                    My apologies, Dr. Benay Spice, there were additional series in the study I didn't recognize initially that include the area of interest.    Biopsying this soft tissue would be very high risk for bowel perforation. It is confluent with the base of the cecum which shows circumferential thickening. In addition to this finding, there is also an approximately 1.5 cm cystic structure along the mesenteric aspect of the terminal ileum which appears thickened compared to prior. This appears more of an inflammatory etiology to me.    I recommend against very high risk biopsy at this time and obtaining a repeat abdomen/pelvis CT with oral and IV contrast in 1 month, then reconsider biopsy at that time pending results.   Thanks,   Dylan  ----- Message -----  From: Lenore Cordia  Sent: 05/01/2020  7:23 AM EST  To: Ir Procedure Requests  Subject: FW: Biopsy                     ----- Message -----   From: Ladell Pier, MD  Sent: 04/28/2020  4:59 PM EST  To: Lenore Cordia  Subject: RE: Biopsy                    She just had a CT abdomen/pelvis this week, Whipple was over a year ago  ----- Message -----  From: Lenore Cordia  Sent: 04/28/2020  4:48 PM EST  To: Ladell Pier, MD  Subject: FW: Biopsy                    Please see Dr. Malachi Carl response.  ----- Message -----  From: Suzette Battiest, MD  Sent: 04/28/2020  4:27 PM EST  To: Lenore Cordia  Subject: RE: Biopsy                    Incomplete evaluation of infracecal soft tissue prominence on abdomen CT. Recommend dedicated CT abdomen and pelvis for complete characterization. May consider waiting longer if Whipple was very recent as this may represent post-surgical changes.    Dylan  ----- Message -----  From: Lenore Cordia  Sent: 04/28/2020  4:05 PM EST  To: Ir Procedure Requests  Subject: Biopsy  Procedure Requested: CT Biopsy    Reason for Procedure: pancreas cancer, new pelvic soft tissue mass    Provider Requesting: Dr Benay Spice  Provider Telephone: [phone]    Other Info:

## 2020-05-18 ENCOUNTER — Telehealth (INDEPENDENT_AMBULATORY_CARE_PROVIDER_SITE_OTHER): Payer: BC Managed Care – PPO | Admitting: Endocrinology

## 2020-05-18 ENCOUNTER — Encounter: Payer: Self-pay | Admitting: Endocrinology

## 2020-05-18 ENCOUNTER — Other Ambulatory Visit: Payer: Self-pay

## 2020-05-18 ENCOUNTER — Encounter: Payer: Self-pay | Admitting: Internal Medicine

## 2020-05-18 VITALS — Wt 195.0 lb

## 2020-05-18 DIAGNOSIS — E876 Hypokalemia: Secondary | ICD-10-CM

## 2020-05-18 DIAGNOSIS — E1165 Type 2 diabetes mellitus with hyperglycemia: Secondary | ICD-10-CM

## 2020-05-18 DIAGNOSIS — Z794 Long term (current) use of insulin: Secondary | ICD-10-CM | POA: Diagnosis not present

## 2020-05-18 MED ORDER — FREESTYLE LIBRE 2 SENSOR MISC: 2.0000 | 3 refills | Status: DC

## 2020-05-18 NOTE — Progress Notes (Signed)
Patient ID: Alyssa Ross, female   DOB: 1966/05/08, 54 y.o.   MRN: [id number]  I connected with the above-named patient by video enabled telemedicine application and verified that I am speaking with the correct person. The patient was explained the limitations of evaluation and management by telemedicine and the availability of in person appointments.  Patient also understood that there may be a patient responsible charge related to this service . Location of the patient: Patient's home . Location of the provider: Physician office Only the patient and myself were participating in the encounter The patient understood the above statements and agreed to proceed.   Reason for Appointment: Diabetes follow-up    History of Present Illness   Diagnosis: Type 2 DIABETES MELITUS, date of diagnosis 2005   She had previously been on metformin and Amaryl Because of inadequate control she was given Victoza in addition in 2011 With this her blood sugars have been significantly better and her A1c has been as low as 6.2 in 2013 Her blood sugars have been more difficult to control since 2014 an A1c consistently over 7% She  was started on insulin with Levemir in 06/2013 because of  increase in her A1c to 8.5% She had previously been taking Victoza along with Amaryl and metformin without consistent control Also had difficulty losing weight despite taking 1.8 mg Victoza, also had tried the 3 mg dose Because of episodic nausea and diarrhea in 1/16 she went off her Victoza  RECENT history:   INSULIN dose: TRESIBA 12 units pm.  Humalog mix 75/25, 24 units at breakfast, Humalog 5 to 10 units before meals  Non-insulin hypoglycemic drugs: on metformin 2000 mg,  A1c is last 8.5 Fructosamine is 349  Current management, blood sugars and problems identified as follows  She has started using the Humalog more often after recommendations from the last visit  However her average blood sugar is higher  than on the last visit  She is mostly taking Humalog at lunch and dinner  However she says that when she takes her Humalog mix in the morning at breakfast she will have low normal or low sugars 3 to 4 hours later but this is not seen at all on her sensor tracings where blood sugars are fairly consistently high above target range after 10 AM  She claims that sugars are low when she uses the smart phone but not the reader  Also freestyle [place]en[place] appears to be 20 mg lower than the actual reading  She usually has persistently high readings later in the day and only rarely at target in the evenings  Blood sugars are low normal or low overnight on a couple of occasions  Also because of fear of hypoglycemia she may not always take [place] every day     Side effects from medications: None   Monitors blood glucose: With freestyle Libre sensor  CGM data interpretation and statistics as follows   ACCURACY of the sensor appears to be inadequate because of a difference of 20 mg compared to the lab blood sugar and simultaneous sensor reading  Blood sugar data is missing only intermittently on some days  Overall blood sugars are the lowest between 4-6 AM and then progressively higher until about 8 PM  Blood sugars are above the target of 180 between 10 AM and nearly midnight  Hyperglycemic episodes are occurring at a variable amount and time of day with some significant rise in blood sugar midday, sometimes evenings and a couple  of times overnight also  Generally overnight blood sugars are declining between midnight-5 AM and twice were below 70 transiently  Otherwise no hypoglycemia  POSTPRANDIAL readings are variably high but her blood sugars are only rarely in the target range after midday  CGM use % of time  73  2-week average/SD  189  Time in range  47      %  % Time Above 180  33  % Time above 250  18  % Time Below 70 2     PRE-MEAL Fasting Lunch Dinner Bedtime Overall  Glucose  range:        Averages: 146  220  238     POST-MEAL PC Breakfast PC Lunch PC Dinner  Glucose range:     Averages:  205  217  207    PREVIOUS data:    PRE-MEAL Fasting Lunch Dinner Bedtime Overall  Glucose range:       Averages:  146  220  235   164   POST-MEAL PC Breakfast PC Lunch PC Dinner  Glucose range:     Averages:  205  217  207      Meals: 3 meals per day. Dinner 8-9 pm     DIET: As above         Wt Readings from Last 3 Encounters:  04/28/20 192 lb 14.4 oz (87.5 kg)  04/04/20 200 lb 6.4 oz (90.9 kg)  03/21/20 200 lb 12.8 oz (91.1 kg)    LABS:  Lab Results  Component Value Date   HGBA1C 8.5 (H) 03/09/2020   HGBA1C 7.7 (H) 12/07/2019   HGBA1C 8.6 (H) 07/26/2019   Lab Results  Component Value Date   MICROALBUR <0.7 03/09/2020   LDLCALC 68 01/25/2020   CREATININE 1.07 05/12/2020    Lab on 05/12/2020  Component Date Value Ref Range Status  . Fructosamine 05/12/2020 349* 0 - 285 umol/L Final   Comment: Published reference interval for apparently healthy subjects between age 11 and 89 is 86 - 285 umol/L and in a poorly controlled diabetic population is 228 - 563 umol/L with a mean of 396 umol/L.   Marland Kitchen Sodium 05/12/2020 141  135 - 145 mEq/L Final  . Potassium 05/12/2020 3.3* 3.5 - 5.1 mEq/L Final  . Chloride 05/12/2020 105  96 - 112 mEq/L Final  . CO2 05/12/2020 28  19 - 32 mEq/L Final  . Glucose, Bld 05/12/2020 94  70 - 99 mg/dL Final  . BUN 05/12/2020 10  6 - 23 mg/dL Final  . Creatinine, Ser 05/12/2020 1.07  0.40 - 1.20 mg/dL Final  . GFR 05/12/2020 59.01* >60.00 mL/min Final   Calculated using the CKD-EPI Creatinine Equation (2021)  . Calcium 05/12/2020 9.4  8.4 - 10.5 mg/dL Final    Allergies as of 05/18/2020      Reactions   Cherry Rash   Lemon Oil Rash      Medication List       Accurate as of May 18, 2020  3:00 PM. If you have any questions, ask your nurse or doctor.        FreeStyle Libre 14 Day Reader Kerrin Mo USE READER TO  CHECK BLOOD GLUCOSE WITH FREESTYLE LIBRE SENSORS.   FreeStyle Libre 14 Day Sensor Misc APPLY SENSOR TO BODY ONCE EVERY 14 DAYS TO MONITOR BLOOD GLUCOSE   glucose blood test strip Check sugar 2 times daily.   HumaLOG Mix 75/25 KwikPen (75-25) 100 UNIT/ML Kwikpen Generic drug: Insulin Lispro Prot & Lispro  ADMINISTER 24 UNITS UNDER THE SKIN EVERY DAY BEFORE LUNCH   insulin lispro 100 UNIT/ML KwikPen Commonly known as: HumaLOG KwikPen Inject 20 units under the skin three times daily before meals.   lidocaine-prilocaine cream Commonly known as: EMLA Apply 1 application topically as directed. Apply 1 hour prior to stick and cover with plastic wrap   lisinopril-hydrochlorothiazide 10-12.5 MG tablet Commonly known as: ZESTORETIC TAKE 1 TABLET DAILY   loratadine 10 MG tablet Commonly known as: CLARITIN Take 10 mg by mouth at bedtime.   magnesium oxide 400 (241.3 Mg) MG tablet Commonly known as: MAG-OX TAKE 1 TABLET(400 MG) BY MOUTH TWICE DAILY   metFORMIN 500 MG 24 hr tablet Commonly known as: GLUCOPHAGE-XR TAKE 4 TABLETS ONCE DAILY   NovoFine Plus Pen Needle 32G X 4 MM Misc Generic drug: Insulin Pen Needle USE TO INJECT INSULIN THREE TIMES DAILY   potassium chloride SA 20 MEQ tablet Commonly known as: Klor-Con M20 Take 1 tablet (20 mEq total) by mouth 2 (two) times daily.   rosuvastatin 10 MG tablet Commonly known as: CRESTOR Take 10 mg by mouth daily.   TRESIBA FLEXTOUCH [place] Inject 12 Units into the skin at bedtime.       Allergies:  Allergies  Allergen Reactions  . Cherry Rash  . Lemon Oil Rash    Past Medical History:  Diagnosis Date  . Anemia   . ASCUS (atypical squamous cells of undetermined significance) on Pap smear 08/06/1999  . Breast mass in female 2002   Left  . Cancer [facility])    Neuroendocrine Tumor of the pancreas  . Diabetes mellitus without complication ([place])   . Family history of pancreatic cancer   . Fibroid uterus 2010  . Hypertension   .  Irregular bleeding 2011  . 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 PONV (postoperative nausea and vomiting)    nausea  vomitting after 12/25/18 ERCP  . 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 Yeast vaginitis 2006    Past Surgical History:  Procedure Laterality Date  . BILIARY STENT PLACEMENT N/A 12/25/2018   Procedure: BILIARY STENT PLACEMENT;  Surgeon: Carol Ada, MD;  Location: [facility];  Service: Endoscopy;  Laterality: N/A;  . BILIARY STENT PLACEMENT N/A 01/01/2019   Procedure: BILIARY STENT PLACEMENT;  Surgeon: Carol Ada, MD;  Location: [facility];  Service: Endoscopy;  Laterality: N/A;  . DILATATION & CURETTAGE/HYSTEROSCOPY WITH TRUECLEAR N/A 01/20/2014   Procedure: DILATATION & CURETTAGE/HYSTEROSCOPY WITH TRUCLEAR;  Surgeon: [name], MD;  Location: [facility];  Service: Gynecology;  Laterality: N/A;  . DILATATION & CURRETTAGE/HYSTEROSCOPY WITH RESECTOCOPE N/A 01/20/2014   Procedure: [place];  Surgeon: [name], MD;  Location: [facility];  Service: Gynecology;  Laterality: N/A;  . ENDOSCOPIC RETROGRADE CHOLANGIOPANCREATOGRAPHY (ERCP) WITH PROPOFOL N/A 12/25/2018   Procedure: ENDOSCOPIC RETROGRADE CHOLANGIOPANCREATOGRAPHY (ERCP) WITH PROPOFOL;  Surgeon: Carol Ada, MD;  Location: [facility];  Service: Endoscopy;  Laterality: N/A;  . ENDOSCOPIC RETROGRADE CHOLANGIOPANCREATOGRAPHY (ERCP) WITH PROPOFOL N/A 01/01/2019   Procedure: ENDOSCOPIC RETROGRADE CHOLANGIOPANCREATOGRAPHY (ERCP) WITH PROPOFOL;  Surgeon: Carol Ada, MD;  Location: [facility];  Service: Endoscopy;  Laterality: N/A;  . ERCP  12/25/2018  . FINE NEEDLE ASPIRATION N/A 01/01/2019   Procedure: FINE NEEDLE ASPIRATION (FNA) LINEAR;  Surgeon: Carol Ada, MD;  Location: [facility];  Service: Endoscopy;  Laterality: N/A;  . LAPAROSCOPY N/A 03/04/2019   Procedure: LAPAROSCOPY DIAGNOSTIC;  Surgeon: Stark Klein, MD;   Location: [facility];  Service: General;  Laterality: N/A;  GENERAL AND EPIDURAL  . NO PAST SURGERIES    . PARTIAL NEPHRECTOMY Right 03/04/2019   Procedure: Nephrectomy Partial;  Surgeon: Cleon Gustin, MD;  Location: [facility];  Service: Urology;  Laterality: Right;  . PORTACATH PLACEMENT Left 04/21/2019   Procedure: INSERTION PORT-A-CATH;  Surgeon: Stark Klein, MD;  Location: [facility];  Service: General;  Laterality: Left;  . SPHINCTEROTOMY  12/25/2018   Procedure: SPHINCTEROTOMY;  Surgeon: Carol Ada, MD;  Location: [facility];  Service: Endoscopy;;  . Lavell Islam REMOVAL  01/01/2019   Procedure: STENT REMOVAL;  Surgeon: Carol Ada, MD;  Location: [facility];  Service: Endoscopy;;  . UPPER ESOPHAGEAL ENDOSCOPIC ULTRASOUND (EUS) N/A 01/01/2019   Procedure: UPPER ESOPHAGEAL ENDOSCOPIC ULTRASOUND (EUS);  Surgeon: Carol Ada, MD;  Location: [facility];  Service: Endoscopy;  Laterality: N/A;  . WHIPPLE PROCEDURE N/A 03/04/2019   Procedure: WHIPPLE PROCEDURE;  Surgeon: Stark Klein, MD;  Location[facility];  Service: General;  Laterality: N/A;  GENERAL AND EPIDURAL    Family History  Problem Relation Age of Onset  . Diabetes Mother   . Dementia Mother   . Hyperlipidemia Mother   . Hypertension Mother   . Irregular heart beat Mother   . Diabetes Father   . Hypertension Father   . Hyperlipidemia Father   . Down syndrome Sister   . Diabetes Sister   . Hyperlipidemia Brother   . Heart Problems Brother   . Goiter Maternal Aunt   . Thyroid nodules Sister   . Cancer Cousin 60       eye; maternal first cousin  . Goiter Cousin   . Cancer Paternal Aunt        unknown form of cancer  . Cancer Cousin        unknown form of cancer; paternal first cousin  . Pancreatic cancer Cousin 83       paternal first cousin  . Cancer Cousin 109       unknown cancer; paternal first cousin  . Cancer Cousin 10       unknown cancer; paternal first cousin    Social History:  reports that she has never  smoked. She has never used smokeless tobacco. She reports current alcohol use. She reports that she does not use drugs.  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    Assessment/Plan:   Diabetes type 2, on insulin  See history of present illness for detailed discussion of her current management, blood sugar patterns and problems identified  Her  A1c is usually over 8% and now fructosamine is 349  She still has significant postprandial hyperglycemia throughout the day as judged by her freestyle [place] She is not taking enough insulin to cover her meals and not using carbohydrate counting as recommended With recent freestyle libre download her blood sugars are in target range only 47% of the time Also the freestyle libre appears to be about 20 mg lower than the actual reading in the lab Although she thinks she has low sugars after taking her Humalog mix 24 units in the morning this is not documented on her freestyle libre download  Recommendations: 12/21  She will try to use the freestyle libre with her smart phone app only and switch to the  [place] new version  She can reduce her Humalog mix to 18 to 20 units in the morning  She needs to take her Humalog for coverage of her carbs at lunch and dinner based on total number of carbs and dividing this by 5 and not adjusting it based on having taken her previous mix insulin or [place] blood sugar  She needs to cover her dinnertime more consistently than before and reassured her that her dinnertime Humalog is not going to drop her sugar during the night  She can stop taking [place] for now  No change in Metformin  Continue regular exercise  A1c in 2 months  She will take potassium twice a day  There are no Patient Instructions on file for this visit.      Elayne Snare 05/18/2020, 3:00 PM   Note: This office note was prepared with Dragon voice recognition system technology. Any transcriptional errors that result from this process are unintentional.

## 2020-05-24 ENCOUNTER — Other Ambulatory Visit: Payer: Self-pay | Admitting: Radiology

## 2020-05-25 ENCOUNTER — Other Ambulatory Visit: Payer: Self-pay | Admitting: Radiology

## 2020-05-25 ENCOUNTER — Other Ambulatory Visit: Payer: Self-pay | Admitting: Student

## 2020-05-26 ENCOUNTER — Other Ambulatory Visit: Payer: Self-pay

## 2020-05-26 ENCOUNTER — Other Ambulatory Visit: Payer: Self-pay | Admitting: Oncology

## 2020-05-26 ENCOUNTER — Ambulatory Visit (HOSPITAL_COMMUNITY)
Admission: RE | Admit: 2020-05-26 | Discharge: 2020-05-26 | Disposition: A | Discharge status: Home / Self Care | Payer: BC Managed Care – PPO | Source: Ambulatory Visit | Attending: Oncology | Admitting: Oncology

## 2020-05-26 DIAGNOSIS — C250 Malignant neoplasm of head of pancreas: Secondary | ICD-10-CM | POA: Diagnosis not present

## 2020-05-26 DIAGNOSIS — N852 Hypertrophy of uterus: Secondary | ICD-10-CM | POA: Diagnosis not present

## 2020-05-26 DIAGNOSIS — E278 Other specified disorders of adrenal gland: Secondary | ICD-10-CM | POA: Diagnosis not present

## 2020-05-26 DIAGNOSIS — C259 Malignant neoplasm of pancreas, unspecified: Secondary | ICD-10-CM | POA: Diagnosis not present

## 2020-05-26 DIAGNOSIS — M799 Soft tissue disorder, unspecified: Secondary | ICD-10-CM | POA: Diagnosis not present

## 2020-05-26 DIAGNOSIS — D259 Leiomyoma of uterus, unspecified: Secondary | ICD-10-CM | POA: Diagnosis not present

## 2020-05-26 DIAGNOSIS — K3580 Unspecified acute appendicitis: Secondary | ICD-10-CM | POA: Diagnosis not present

## 2020-05-26 LAB — CBC WITH DIFFERENTIAL/PLATELET
Abs Immature Granulocytes: 0.01 10*3/uL (ref 0.00–0.07)
Basophils Absolute: 0 10*3/uL (ref 0.0–0.1)
Basophils Relative: 1 %
Eosinophils Absolute: 0.1 10*3/uL (ref 0.0–0.5)
Eosinophils Relative: 2 %
HCT: 32.6 % — ABNORMAL LOW (ref 36.0–46.0)
Hemoglobin: 10.2 g/dL — ABNORMAL LOW (ref 12.0–15.0)
Immature Granulocytes: 0 %
Lymphocytes Relative: 30 %
Lymphs Abs: 1.2 10*3/uL (ref 0.7–4.0)
MCH: 29.8 pg (ref 26.0–34.0)
MCHC: 31.3 g/dL (ref 30.0–36.0)
MCV: 95.3 fL (ref 80.0–100.0)
Monocytes Absolute: 0.3 10*3/uL (ref 0.1–1.0)
Monocytes Relative: 8 %
Neutro Abs: 2.4 10*3/uL (ref 1.7–7.7)
Neutrophils Relative %: 59 %
Platelets: 298 10*3/uL (ref 150–400)
RBC: 3.42 MIL/uL — ABNORMAL LOW (ref 3.87–5.11)
RDW: 13.9 % (ref 11.5–15.5)
WBC: 4.1 10*3/uL (ref 4.0–10.5)
nRBC: 0 % (ref 0.0–0.2)

## 2020-05-26 LAB — BASIC METABOLIC PANEL
Anion gap: 9 (ref 5–15)
BUN: 7 mg/dL (ref 6–20)
CO2: 25 mmol/L (ref 22–32)
Calcium: 9.3 mg/dL (ref 8.9–10.3)
Chloride: 105 mmol/L (ref 98–111)
Creatinine, Ser: 1.08 mg/dL — ABNORMAL HIGH (ref 0.44–1.00)
GFR, Estimated: >60 mL/min (ref >60)
Glucose, Bld: 272 mg/dL — ABNORMAL HIGH (ref 70–99)
Potassium: 3.5 mmol/L (ref 3.5–5.1)
Sodium: 139 mmol/L (ref 135–145)

## 2020-05-26 LAB — GLUCOSE, CAPILLARY: Glucose-Capillary: 224 mg/dL — ABNORMAL HIGH (ref 70–99)

## 2020-05-26 LAB — PROTIME-INR
INR: 1.1 (ref 0.8–1.2)
Prothrombin Time: 13.5 seconds (ref 11.4–15.2)

## 2020-05-26 MED ORDER — ONDANSETRON HCL 4 MG/2ML IJ SOLN
INTRAMUSCULAR | Status: AC
  Filled 2020-05-26: qty 2

## 2020-05-26 MED ORDER — SODIUM CHLORIDE 0.9 % IV SOLN: INTRAVENOUS | Status: DC

## 2020-05-26 MED ORDER — IOHEXOL 300 MG/ML  SOLN
100.0000 mL | Freq: Once | INTRAMUSCULAR | Status: AC | PRN
  Administered 2020-05-26: 100 mL via INTRAVENOUS

## 2020-05-26 MED ORDER — MIDAZOLAM HCL 2 MG/2ML IJ SOLN
INTRAMUSCULAR | Status: AC
  Filled 2020-05-26: qty 4

## 2020-05-26 MED ORDER — FENTANYL CITRATE (PF) 100 MCG/2ML IJ SOLN
INTRAMUSCULAR | Status: AC
  Filled 2020-05-26: qty 4

## 2020-05-26 NOTE — Consult Note (Signed)
Chief Complaint: Patient was seen in consultation today for pelvic soft tissue mass/biopsy.  Referring Physician(s): Ladell Pier  Supervising Physician: Aletta Edouard  Patient Status: [facility] - Out-pt  History of Present Illness: Alyssa Ross is a 54 y.o. female with a past medical history of hypertension, neuroendocrine tumor of pancreas, uterine fibroids, diabetes mellitus, and anemia. She was unfortunately diagnosed with neuroendocrine tumor of pancreas in early 2020. Her cancer is managed by Dr. Benay Spice. She underwent Wipple procedure in OR 03/04/2019. Follow-up imaging revealed pelvic soft tissue thickening/mass concerning for metastasis.  CT abdomen/pelvis 04/26/2020: 1. Status post Whipple procedure, without locally recurrent disease. 2. Developing soft tissue thickening caudal to the cecum for which peritoneal/serosal metastasis are a concern. Recommend attention on follow-up. 3. Similar right adrenal nodule, favoring an adenoma. 4. Similar soft tissue thickening within the porta hepatis, favored to be treatment related. 5. Fibroid uterus. 6. Hepatic steatosis.  IR consulted by Dr. Benay Spice for possible image-guided pelvic soft tissue mass biopsy for tissue diagnosis. Patient awake and alert laying in bed with no complaints at this time. Denies fever, chills, chest pain, dyspnea, abdominal pain, or headache.   Past Medical History:  Diagnosis Date  . Anemia   . ASCUS (atypical squamous cells of undetermined significance) on Pap smear 08/06/1999  . Breast mass in female 2002   Left  . Cancer [facility])    Neuroendocrine Tumor of the pancreas  . Diabetes mellitus without complication ([place])   . Family history of pancreatic cancer   . Fibroid uterus 2010  . Hypertension   . Irregular bleeding 2011  . 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 PONV (postoperative nausea and vomiting)    nausea  vomitting after 12/25/18 ERCP  . Primary pancreatic  neuroendocrine tumor 02/04/2019  . Yeast vaginitis 2006    Past Surgical History:  Procedure Laterality Date  . BILIARY STENT PLACEMENT N/A 12/25/2018   Procedure: BILIARY STENT PLACEMENT;  Surgeon: Carol Ada, MD;  Location: [facility];  Service: Endoscopy;  Laterality: N/A;  . BILIARY STENT PLACEMENT N/A 01/01/2019   Procedure: BILIARY STENT PLACEMENT;  Surgeon: Carol Ada, MD;  Location: [facility];  Service: Endoscopy;  Laterality: N/A;  . DILATATION & CURETTAGE/HYSTEROSCOPY WITH TRUECLEAR N/A 01/20/2014   Procedure: DILATATION & CURETTAGE/HYSTEROSCOPY WITH TRUCLEAR;  Surgeon: [name], MD;  Location: [place] [facility];  Service: Gynecology;  Laterality: N/A;  . DILATATION & CURRETTAGE/HYSTEROSCOPY WITH RESECTOCOPE N/A 01/20/2014   Procedure: [place];  Surgeon: [name], MD;  Location: [place] [facility];  Service: Gynecology;  Laterality: N/A;  . ENDOSCOPIC RETROGRADE CHOLANGIOPANCREATOGRAPHY (ERCP) WITH PROPOFOL N/A 12/25/2018   Procedure: ENDOSCOPIC RETROGRADE CHOLANGIOPANCREATOGRAPHY (ERCP) WITH PROPOFOL;  Surgeon: Carol Ada, MD;  Location: [facility];  Service: Endoscopy;  Laterality: N/A;  . ENDOSCOPIC RETROGRADE CHOLANGIOPANCREATOGRAPHY (ERCP) WITH PROPOFOL N/A 01/01/2019   Procedure: ENDOSCOPIC RETROGRADE CHOLANGIOPANCREATOGRAPHY (ERCP) WITH PROPOFOL;  Surgeon: Carol Ada, MD;  Location: [facility];  Service: Endoscopy;  Laterality: N/A;  . ERCP  12/25/2018  . FINE NEEDLE ASPIRATION N/A 01/01/2019   Procedure: FINE NEEDLE ASPIRATION (FNA) LINEAR;  Surgeon: Carol Ada, MD;  Location: [facility];  Service: Endoscopy;  Laterality: N/A;  . LAPAROSCOPY N/A 03/04/2019   Procedure: LAPAROSCOPY DIAGNOSTIC;  Surgeon: Stark Klein, MD;  Location: [place];  Service: General;  Laterality: N/A;  GENERAL AND EPIDURAL  . NO PAST SURGERIES    . PARTIAL NEPHRECTOMY Right 03/04/2019   Procedure: Nephrectomy Partial;  Surgeon: Cleon Gustin,  MD;  Location: [facility];  Service: Urology;  Laterality: Right;  . PORTACATH PLACEMENT Left 04/21/2019   Procedure: INSERTION PORT-A-CATH;  Surgeon: Stark Klein, MD;  Location: [place];  Service: General;  Laterality: Left;  . SPHINCTEROTOMY  12/25/2018   Procedure: SPHINCTEROTOMY;  Surgeon: Carol Ada, MD;  Location: [facility];  Service: Endoscopy;;  . Lavell Islam REMOVAL  01/01/2019   Procedure: STENT REMOVAL;  Surgeon: Carol Ada, MD;  Location: [facility];  Service: Endoscopy;;  . UPPER ESOPHAGEAL ENDOSCOPIC ULTRASOUND (EUS) N/A 01/01/2019   Procedure: UPPER ESOPHAGEAL ENDOSCOPIC ULTRASOUND (EUS);  Surgeon: Carol Ada, MD;  Location: [facility];  Service: Endoscopy;  Laterality: N/A;  . WHIPPLE PROCEDURE N/A 03/04/2019   Procedure: WHIPPLE PROCEDURE;  Surgeon: Stark Klein, MD;  Location: [place];  Service: General;  Laterality: N/A;  GENERAL AND EPIDURAL    Allergies: Cherry and Lemon oil  Medications: Prior to Admission medications   Medication Sig Start Date End Date Taking? Authorizing Provider  HUMALOG MIX 75/25 KWIKPEN (75-25) 100 UNIT/ML Kwikpen ADMINISTER 24 UNITS UNDER THE SKIN EVERY DAY BEFORE LUNCH Patient taking differently: Inject 20 Units into the skin daily before breakfast. 05/11/20  Yes Elayne Snare, MD  ibuprofen (ADVIL) 200 MG tablet Take 600 mg by mouth every 6 (six) hours as needed for mild pain.   Yes [provider]  insulin lispro (HUMALOG KWIKPEN) 100 UNIT/ML KwikPen Inject 20 units under the skin three times daily before meals. Patient taking differently: Inject 3-20 Units into the skin 3 (three) times daily as needed (high blood sugar). 10/15/19  Yes Elayne Snare, MD  lidocaine-prilocaine (EMLA) cream Apply 1 application topically as directed. Apply 1 hour prior to stick and cover with plastic wrap Patient taking differently: Apply 1 application topically daily as needed (port access). Apply 1 hour prior to stick and cover with plastic wrap 04/26/19  Yes  Ladell Pier, MD  lisinopril-hydrochlorothiazide (ZESTORETIC) 10-12.5 MG tablet TAKE 1 TABLET DAILY Patient taking differently: Take 1 tablet by mouth at bedtime. 07/16/19  Yes Glendale Chard, MD  loratadine (CLARITIN) 10 MG tablet Take 10 mg by mouth at bedtime.    Yes [provider]  magnesium oxide (MAG-OX) 400 (241.3 Mg) MG tablet TAKE 1 TABLET(400 MG) BY MOUTH TWICE DAILY Patient taking differently: Take 400 mg by mouth 2 (two) times daily. 04/04/20  Yes Ladell Pier, MD  metFORMIN (GLUCOPHAGE-XR) 500 MG 24 hr tablet TAKE 4 TABLETS ONCE DAILY Patient taking differently: Take 2,000 mg by mouth at bedtime. 04/19/20  Yes Elayne Snare, MD  potassium chloride SA (KLOR-CON M20) 20 MEQ tablet Take 1 tablet (20 mEq total) by mouth 2 (two) times daily. 03/21/20  Yes Owens Shark, NP  rosuvastatin (CRESTOR) 10 MG tablet Take 10 mg by mouth at bedtime.   Yes [provider]  Continuous Blood Gluc Receiver (FREESTYLE LIBRE 14 DAY READER) DEVI USE READER TO CHECK BLOOD GLUCOSE WITH FREESTYLE LIBRE SENSORS. 12/25/17   Elayne Snare, MD  Continuous Blood Gluc Sensor (FREESTYLE LIBRE 2 SENSOR) MISC 2 Devices by Does not apply route every 14 (fourteen) days. 05/18/20   Elayne Snare, MD  glucose blood test strip Check sugar 2 times daily. 03/21/20   Elayne Snare, MD  NOVOFINE PLUS PEN NEEDLE 32G X 4 MM MISC USE TO INJECT INSULIN THREE TIMES DAILY 05/05/20   Elayne Snare, MD     Family History  Problem Relation Age of Onset  . Diabetes Mother   . Dementia Mother   . Hyperlipidemia  Mother   . Hypertension Mother   . Irregular heart beat Mother   . Diabetes Father   . Hypertension Father   . Hyperlipidemia Father   . Down syndrome Sister   . Diabetes Sister   . Hyperlipidemia Brother   . Heart Problems Brother   . Goiter Maternal Aunt   . Thyroid nodules Sister   . Cancer Cousin 60       eye; maternal first cousin  . Goiter Cousin   . Cancer Paternal Aunt        unknown form of  cancer  . Cancer Cousin        unknown form of cancer; paternal first cousin  . Pancreatic cancer Cousin 22       paternal first cousin  . Cancer Cousin 78       unknown cancer; paternal first cousin  . Cancer Cousin 28       unknown cancer; paternal first cousin    Social History   Socioeconomic History  . Marital status: Single    Spouse name: Not on file  . Number of children: Not on file  . Years of education: Not on file  . Highest education level: Not on file  Occupational History  . Not on file  Tobacco Use  . Smoking status: Never Smoker  . Smokeless tobacco: Never Used  Vaping Use  . Vaping Use: Never used  Substance and Sexual Activity  . Alcohol use: Yes    Comment: rarely  . Drug use: No  . Sexual activity: Yes    Birth control/protection: Post-menopausal  Other Topics Concern  . Not on file  Social History Narrative  . Not on file   Social Determinants of Health   Financial Resource Strain: Not on file  Food Insecurity: Not on file  Transportation Needs: Not on file  Physical Activity: Not on file  Stress: Not on file  Social Connections: Not on file     Review of Systems: A 12 point ROS discussed and pertinent positives are indicated in the HPI above.  All other systems are negative.  Review of Systems  Constitutional: Negative for chills and fever.  Respiratory: Negative for shortness of breath and wheezing.   Cardiovascular: Negative for chest pain and palpitations.  Gastrointestinal: Negative for abdominal pain.  Neurological: Negative for headaches.  Psychiatric/Behavioral: Negative for behavioral problems and confusion.    Vital Signs: BP 134/85   Pulse 75   Temp 98.6 F (37 C) (Oral)   Ht 5' 8.5" (1.74 m)   Wt 188 lb (85.3 kg)   LMP 09/17/2012   SpO2 100%   BMI 28.17 kg/m   Physical Exam Vitals and nursing note reviewed.  Constitutional:      General: She is not in acute distress.    Appearance: Normal appearance.   Cardiovascular:     Rate and Rhythm: Normal rate and regular rhythm.     Heart sounds: Normal heart sounds. No murmur heard.   Pulmonary:     Effort: Pulmonary effort is normal. No respiratory distress.     Breath sounds: Normal breath sounds. No wheezing.  Skin:    General: Skin is warm and dry.  Neurological:     Mental Status: She is alert and oriented to person, place, and time.      MD Evaluation Airway: WNL Heart: WNL Abdomen: WNL Chest/ Lungs: WNL ASA  Classification: 3 Mallampati/Airway Score: Two   Imaging: CT Chest W Contrast  Result Date:  04/27/2020 CLINICAL DATA:  Pancreatic cancer, status post Whipple procedure. Chemotherapy complete. Asymptomatic. EXAM: CT CHEST, ABDOMEN, AND PELVIS WITH CONTRAST TECHNIQUE: Multidetector CT imaging of the chest, abdomen and pelvis was performed following the standard protocol during bolus administration of intravenous contrast. CONTRAST:  13mL OMNIPAQUE IOHEXOL 300 MG/ML  SOLN COMPARISON:  02/07/2020 FINDINGS: CT CHEST FINDINGS Cardiovascular: Left Port-A-Cath tip at low SVC. Aortic atherosclerosis. Normal heart size, without pericardial effusion. No central pulmonary embolism, on this non-dedicated study. Mediastinum/Nodes: No supraclavicular adenopathy. No mediastinal or hilar adenopathy. Lungs/Pleura: No pleural fluid. Bilateral pulmonary nodules consistent with metastatic disease. An inferior right upper lobe cavitary nodule measures 1.3 cm on 62/12 and has enlarged from 11 mm on the prior exam (when remeasured). Central left lower lobe solid pulmonary nodule measures 12 x 11 mm on 51/12 and has enlarged compared to a 9 mm cavitary nodule on the prior exam. Right lower lobe 7 mm nodule on 71/2 has enlarged from 3 mm on the prior exam (when remeasured). Musculoskeletal: No acute osseous abnormality. CT ABDOMEN PELVIS FINDINGS Hepatobiliary: Moderate hepatic steatosis. No focal liver lesion. Pneumobilia. Choledochojejunostomy.  Pancreas: Status post pancreatic head resection and pancreatic to jejunal anastomosis. Pancreatic stent is unchanged in position. No duct dilatation or local recurrence. Chronic interstitial thickening throughout the anterior pararenal space is similar, mild, and may be treatment related. Spleen: Normal in size, without focal abnormality. Adrenals/Urinary Tract: 2.2 cm right adrenal nodule on 81/7 is similar in size to on the prior exam (when remeasured). Normal left adrenal gland, kidneys, and urinary bladder. Stomach/Bowel: Gastrojejunostomy. Normal colon and terminal ileum. Otherwise normal small bowel. Vascular/Lymphatic: Aortic atherosclerosis. No abdominopelvic adenopathy. Reproductive: Marked fibroid uterus, with a similar exophytic left pelvic mass of 5.4 cm. Other: Trace pelvic fluid, slightly increased. Soft tissue thickening inferior to the cecal tip on 144/7 is new, suspicious for peritoneal metastasis. Example at maximally 3.2 cm. Also coronal image 40/8.Felt to be separate from suboptimally opacified distal small bowel loops. Soft tissue thickening in the porta hepatis and surrounding the caudate on 81/7 is similar to on the prior. Musculoskeletal: Subcutaneous edema within the anterior abdominal wall is likely iatrogenic. Degenerative partial fusion of bilateral sacroiliac joints. IMPRESSION: CT CHEST IMPRESSION 1. Mild progression of pulmonary metastasis compared 02/07/2020. 2. No thoracic adenopathy. 3.  Aortic Atherosclerosis (ICD10-I70.0). CT ABDOMEN AND PELVIS IMPRESSION 1. Status post Whipple procedure, without locally recurrent disease. 2. Developing soft tissue thickening caudal to the cecum for which peritoneal/serosal metastasis are a concern. Recommend attention on follow-up. 3. Similar right adrenal nodule, favoring an adenoma. 4. Similar soft tissue thickening within the porta hepatis, favored to be treatment related. 5. Fibroid uterus. 6. Hepatic steatosis. Electronically Signed   By:  Abigail Miyamoto M.D.   On: 04/27/2020 09:25   CT Abdomen Pelvis W Contrast  Result Date: 04/27/2020 CLINICAL DATA:  Pancreatic cancer, status post Whipple procedure. Chemotherapy complete. Asymptomatic. EXAM: CT CHEST, ABDOMEN, AND PELVIS WITH CONTRAST TECHNIQUE: Multidetector CT imaging of the chest, abdomen and pelvis was performed following the standard protocol during bolus administration of intravenous contrast. CONTRAST:  153mL OMNIPAQUE IOHEXOL 300 MG/ML  SOLN COMPARISON:  02/07/2020 FINDINGS: CT CHEST FINDINGS Cardiovascular: Left Port-A-Cath tip at low SVC. Aortic atherosclerosis. Normal heart size, without pericardial effusion. No central pulmonary embolism, on this non-dedicated study. Mediastinum/Nodes: No supraclavicular adenopathy. No mediastinal or hilar adenopathy. Lungs/Pleura: No pleural fluid. Bilateral pulmonary nodules consistent with metastatic disease. An inferior right upper lobe cavitary nodule measures 1.3 cm on 62/12 and has  enlarged from 11 mm on the prior exam (when remeasured). Central left lower lobe solid pulmonary nodule measures 12 x 11 mm on 51/12 and has enlarged compared to a 9 mm cavitary nodule on the prior exam. Right lower lobe 7 mm nodule on 71/2 has enlarged from 3 mm on the prior exam (when remeasured). Musculoskeletal: No acute osseous abnormality. CT ABDOMEN PELVIS FINDINGS Hepatobiliary: Moderate hepatic steatosis. No focal liver lesion. Pneumobilia. Choledochojejunostomy. Pancreas: Status post pancreatic head resection and pancreatic to jejunal anastomosis. Pancreatic stent is unchanged in position. No duct dilatation or local recurrence. Chronic interstitial thickening throughout the anterior pararenal space is similar, mild, and may be treatment related. Spleen: Normal in size, without focal abnormality. Adrenals/Urinary Tract: 2.2 cm right adrenal nodule on 81/7 is similar in size to on the prior exam (when remeasured). Normal left adrenal gland, kidneys, and  urinary bladder. Stomach/Bowel: Gastrojejunostomy. Normal colon and terminal ileum. Otherwise normal small bowel. Vascular/Lymphatic: Aortic atherosclerosis. No abdominopelvic adenopathy. Reproductive: Marked fibroid uterus, with a similar exophytic left pelvic mass of 5.4 cm. Other: Trace pelvic fluid, slightly increased. Soft tissue thickening inferior to the cecal tip on 144/7 is new, suspicious for peritoneal metastasis. Example at maximally 3.2 cm. Also coronal image 40/8.Felt to be separate from suboptimally opacified distal small bowel loops. Soft tissue thickening in the porta hepatis and surrounding the caudate on 81/7 is similar to on the prior. Musculoskeletal: Subcutaneous edema within the anterior abdominal wall is likely iatrogenic. Degenerative partial fusion of bilateral sacroiliac joints. IMPRESSION: CT CHEST IMPRESSION 1. Mild progression of pulmonary metastasis compared 02/07/2020. 2. No thoracic adenopathy. 3.  Aortic Atherosclerosis (ICD10-I70.0). CT ABDOMEN AND PELVIS IMPRESSION 1. Status post Whipple procedure, without locally recurrent disease. 2. Developing soft tissue thickening caudal to the cecum for which peritoneal/serosal metastasis are a concern. Recommend attention on follow-up. 3. Similar right adrenal nodule, favoring an adenoma. 4. Similar soft tissue thickening within the porta hepatis, favored to be treatment related. 5. Fibroid uterus. 6. Hepatic steatosis. Electronically Signed   By: Abigail Miyamoto M.D.   On: 04/27/2020 09:25    Labs:  CBC: Recent Labs    10/18/19 1538 11/29/19 0816 02/07/20 1035 05/26/20 0956  WBC 4.1 3.1* 3.4* 4.1  HGB 10.6* 9.6* 10.7* 10.2*  HCT 32.1* 29.2* 32.0* 32.6*  PLT 283 329 352 298    COAGS: Recent Labs    05/26/20 0956  INR 1.1    BMP: Recent Labs    10/18/19 1538 11/05/19 1014 11/29/19 0816 12/07/19 1400 02/07/20 1035 03/09/20 1358 03/21/20 1330 04/26/20 1114 05/12/20 0848 05/26/20 0956  NA 140 140 145  --  141    < > 142 141 141 139  K 3.7 3.3* 3.4*  --  3.3*   < > 3.4* 4.6 3.3* 3.5  CL 103 104 108  --  106   < > 110 104 105 105  CO2 30 26 25   --  28   < > 29 30 28 25   GLUCOSE 161* 178* 108*   < > 113*   < > 74 210* 94 272*  BUN 10 13 4*  --  4*   < > 9 7 10 7   CALCIUM 9.4 9.6 9.3  --  10.2   < > 9.5 9.0 9.4 9.3  CREATININE 1.02* 0.88 0.95  --  1.02*   < > 1.11* 1.05* 1.07 1.08*  GFRNONAA >60 >60 >60  --  >60  --  56* >60  --  >60  GFRAA >60 >60 >60  --  >60  --   --   --   --   --    < > = values in this interval not displayed.    LIVER FUNCTION TESTS: Recent Labs    11/29/19 0816 02/07/20 1035 03/21/20 1330 04/26/20 1114  BILITOT 0.4 0.4 0.4 0.4  AST 53* 36 40 45*  ALT 58* 34 39 21  ALKPHOS 139* 133* 123 118  PROT 6.7 7.0 7.2 7.5  ALBUMIN 3.5 3.6 3.7 3.0*     Assessment and Plan:  Pelvic soft tissue mass in setting of pancreatic cancer. Plan for image-guided soft tissue mass biopsy today in IR. Patient endorses that she drank oral contrast at 0400 today as instructed. Patient is NPO. Afebrile and WBCs WNL. She does not take blood thinners. INR 1.1 today.  Risks and benefits discussed with the patient including, but not limited to bleeding, infection, damage to adjacent structures or low yield requiring additional tests. All of the patient's questions were answered, patient is agreeable to proceed. Consent signed and in chart.   Thank you for this interesting consult.  I greatly enjoyed meeting Alyssa Ross and look forward to participating in their care.  A copy of this report was sent to the requesting provider on this date.  Electronically Signed: Earley Abide, PA-C 05/26/2020, 11:27 AM   I spent a total of 30 Minutes in face to face in clinical consultation, greater than 50% of which was counseling/coordinating care for pelvic soft tissue mass/biopsy.

## 2020-05-26 NOTE — Discharge Instructions (Addendum)
Needle Biopsy, Care After This sheet gives you information about how to care for yourself after your procedure. Your health care provider may also give you more specific instructions. If you have problems or questions, contact your health care provider. What can I expect after the procedure? After the procedure, it is common to have soreness, bruising, or mild pain at the puncture site. This should go away in a few days. Follow these instructions at home: Needle insertion site care   Wash your hands with soap and water before you change your bandage (dressing). If you cannot use soap and water, use hand sanitizer.  Follow instructions from your health care provider about how to take care of your puncture site. This includes: ? When and how to change your dressing. ? When to remove your dressing.  Check your puncture site every day for signs of infection. Check for: ? Redness, swelling, or pain. ? Fluid or blood. ? Pus or a bad smell. ? Warmth. General instructions  Return to your normal activities as told by your health care provider. Ask your health care provider what activities are safe for you.  Do not take baths, swim, or use a hot tub until your health care provider approves. Ask your health care provider if you may take showers. You may only be allowed to take sponge baths.  Take over-the-counter and prescription medicines only as told by your health care provider.  Keep all follow-up visits as told by your health care provider. This is important. Contact a health care provider if:  You have a fever.  You have redness, swelling, or pain at the puncture site that lasts longer than a few days.  You have fluid, blood, or pus coming from your puncture site.  Your puncture site feels warm to the touch. Get help right away if:  You have severe bleeding from the puncture site. Summary  After the procedure, it is common to have soreness, bruising, or mild pain at the puncture  site. This should go away in a few days.  Check your puncture site every day for signs of infection, such as redness, swelling, or pain.  Get help right away if you have severe bleeding from your puncture site. This information is not intended to replace advice given to you by your health care provider. Make sure you discuss any questions you have with your health care provider. Document Revised: 08/08/2017 Document Reviewed: 06/09/2017 Elsevier Patient Education  [place]. Moderate Conscious Sedation, Adult Sedation is the use of medicines to promote relaxation and relieve discomfort and anxiety. Moderate conscious sedation is a type of sedation. Under moderate conscious sedation, you are less alert than normal, but you are still able to respond to instructions, touch, or both. Moderate conscious sedation is used during short medical and dental procedures. It is milder than deep sedation, which is a type of sedation under which you cannot be easily woken up. It is also milder than general anesthesia, which is the use of medicines to make you unconscious. Moderate conscious sedation allows you to return to your regular activities sooner. Tell a health care provider about:  Any allergies you have.  All medicines you are taking, including vitamins, herbs, eye drops, creams, and over-the-counter medicines.  Use of steroids (by mouth or creams).  Any problems you or family members have had with sedatives and anesthetic medicines.  Any blood disorders you have.  Any surgeries you have had.  Any medical conditions you have, such  as sleep apnea.  Whether you are pregnant or may be pregnant.  Any use of cigarettes, alcohol, marijuana, or street drugs. What are the risks? Generally, this is a safe procedure. However, problems may occur, including:  Getting too much medicine (oversedation).  Nausea.  Allergic reaction to medicines.  Trouble breathing. If this happens, a  breathing tube may be used to help with breathing. It will be removed when you are awake and breathing on your own.  Heart trouble.  Lung trouble. What happens before the procedure? Staying hydrated Follow instructions from your health care provider about hydration, which may include:  Up to 2 hours before the procedure - you may continue to drink clear liquids, such as water, clear fruit juice, black coffee, and plain tea. Eating and drinking restrictions Follow instructions from your health care provider about eating and drinking, which may include:  8 hours before the procedure - stop eating heavy meals or foods such as meat, fried foods, or fatty foods.  6 hours before the procedure - stop eating light meals or foods, such as toast or cereal.  6 hours before the procedure - stop drinking milk or drinks that contain milk.  2 hours before the procedure - stop drinking clear liquids. Medicine Ask your health care provider about:  Changing or stopping your regular medicines. This is especially important if you are taking diabetes medicines or blood thinners.  Taking medicines such as aspirin and ibuprofen. These medicines can thin your blood. Do not take these medicines before your procedure if your health care provider instructs you not to.  Tests and exams  You will have a physical exam.  You may have blood tests done to show: ? How well your kidneys and liver are working. ? How well your blood can clot. General instructions  Plan to have someone take you home from the hospital or clinic.  If you will be going home right after the procedure, plan to have someone with you for 24 hours. What happens during the procedure?  An IV tube will be inserted into one of your veins.  Medicine to help you relax (sedative) will be given through the IV tube.  The medical or dental procedure will be performed. What happens after the procedure?  Your blood pressure, heart rate,  breathing rate, and blood oxygen level will be monitored often until the medicines you were given have worn off.  Do not drive for 24 hours. This information is not intended to replace advice given to you by your health care provider. Make sure you discuss any questions you have with your health care provider. Document Revised: 05/09/2017 Document Reviewed: 09/16/2015 Elsevier Patient Education  2020 [employer].

## 2020-05-31 ENCOUNTER — Inpatient Hospital Stay: Payer: BC Managed Care – PPO | Attending: Genetic Counselor | Admitting: Oncology

## 2020-05-31 ENCOUNTER — Telehealth: Payer: Self-pay | Admitting: Oncology

## 2020-05-31 ENCOUNTER — Other Ambulatory Visit: Payer: Self-pay

## 2020-05-31 ENCOUNTER — Inpatient Hospital Stay: Payer: BC Managed Care – PPO

## 2020-05-31 VITALS — BP 127/87 | HR 67 | Temp 97.5°F | Resp 16 | Ht 68.5 in | Wt 191.3 lb

## 2020-05-31 DIAGNOSIS — Z905 Acquired absence of kidney: Secondary | ICD-10-CM | POA: Insufficient documentation

## 2020-05-31 DIAGNOSIS — Z79899 Other long term (current) drug therapy: Secondary | ICD-10-CM | POA: Insufficient documentation

## 2020-05-31 DIAGNOSIS — R918 Other nonspecific abnormal finding of lung field: Secondary | ICD-10-CM | POA: Diagnosis not present

## 2020-05-31 DIAGNOSIS — Z923 Personal history of irradiation: Secondary | ICD-10-CM | POA: Insufficient documentation

## 2020-05-31 DIAGNOSIS — I10 Essential (primary) hypertension: Secondary | ICD-10-CM | POA: Diagnosis not present

## 2020-05-31 DIAGNOSIS — Z9221 Personal history of antineoplastic chemotherapy: Secondary | ICD-10-CM | POA: Diagnosis not present

## 2020-05-31 DIAGNOSIS — C259 Malignant neoplasm of pancreas, unspecified: Secondary | ICD-10-CM | POA: Insufficient documentation

## 2020-05-31 DIAGNOSIS — E1140 Type 2 diabetes mellitus with diabetic neuropathy, unspecified: Secondary | ICD-10-CM | POA: Insufficient documentation

## 2020-05-31 DIAGNOSIS — C250 Malignant neoplasm of head of pancreas: Secondary | ICD-10-CM | POA: Diagnosis not present

## 2020-05-31 NOTE — Telephone Encounter (Signed)
Scheduled appointments per 12/22 los. Spoke to patient who is aware of appointments dates and times.

## 2020-05-31 NOTE — Progress Notes (Signed)
[place] OFFICE PROGRESS NOTE   Diagnosis: Pancreas cancer  INTERVAL HISTORY:   Alyssa Ross is scheduled.  She feels well.  No abdominal pain.  No difficulty with bowel or bladder function.  No dyspnea or cough.  No complaint. She was scheduled for a biopsy of the pericecal mass on 05/26/2020.  On CT there was no discrete soft tissue mass remaining.  The appendix appeared mildly thickened.  Findings suggest the previously noted CT abnormality represented appendicitis.  Stable small bibasilar pulmonary nodules.  Objective:  Vital signs in last 24 hours:  Blood pressure 127/87, pulse 67, temperature (!) 97.5 F (36.4 C), temperature source Oral, resp. rate 16, height 5' 8.5" (1.74 m), weight 191 lb 4.8 oz (86.8 kg), last menstrual period 09/17/2012, SpO2 100 %.     Lymphatics: No cervical or supraclavicular nodes Resp: Lungs clear bilaterally Cardio: Regular rate and rhythm GI: No hepatosplenomegaly, no mass, nontender Vascular: No leg edema   Portacath/PICC-without erythema  Lab Results:  Lab Results  Component Value Date   WBC 4.1 05/26/2020   HGB 10.2 (L) 05/26/2020   HCT 32.6 (L) 05/26/2020   MCV 95.3 05/26/2020   PLT 298 05/26/2020   NEUTROABS 2.4 05/26/2020    CMP  Lab Results  Component Value Date   NA 139 05/26/2020   K 3.5 05/26/2020   CL 105 05/26/2020   CO2 25 05/26/2020   GLUCOSE 272 (H) 05/26/2020   BUN 7 05/26/2020   CREATININE 1.08 (H) 05/26/2020   CALCIUM 9.3 05/26/2020   PROT 7.5 04/26/2020   ALBUMIN 3.0 (L) 04/26/2020   AST 45 (H) 04/26/2020   ALT 21 04/26/2020   ALKPHOS 118 04/26/2020   BILITOT 0.4 04/26/2020   GFRNONAA >60 05/26/2020   GFRAA >60 02/07/2020    Medications: I have reviewed the patient's current medications.   Assessment/Plan: 1. 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 Tumor invades the duodenal wall and vascular groove, resection margins negative, 4/34  lymph nodes positive ? MSI-stable, tumor showed instability in 2 loci as did adjacent normal tissue ? 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 CTs 01/29/2019-ill-defined pancreas head mass, 5 pulmonary nodules-1 with a small amount of central cavitation, tumor abuts the left margin of the portal vein indistinct density surrounding, hepatic artery, complex cystic lesion of the right kidney, right adrenal mass-characterized as an adenoma on a [facility] MRI 12/21/2018 ? 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 Elevated preoperative CA 19-9--CA 19-9 186 on 01/18/2019 ? CT chest 04/16/2019-multiple bilateral pulmonary nodules, some with increased cavitation, stable in size ? Cycle 1 FOLFIRINOX 04/27/2019 ? Cycle 2 FOLFIRINOX 05/11/2019 ? Cycle 3 FOLFIRINOX 05/23/2019 ? Cycle 4 FOLFIRINOX 06/08/2019 ? Cycle 5 FOLFIRINOX 06/22/2019 ? CT chest 07/02/2019-stable size of bilateral pulmonary nodules. Dominant cavitary lesions in both lungs show increased cavitation with thinner walls. Stable 2.1 cm right adrenal nodule. ? Cycle 6 FOLFIRINOX 07/06/2019 ? Cycle 7 FOLFIRINOX 07/21/2019 ? Cycle 8 FOLFIRINOX 08/03/2019, oxaliplatin deleted secondary to neuropathy ? CT chest 08/24/2019-decreased size of several lung nodules with resolution of a left upper lobe nodule, no new nodules ? Radiationto the pancreas surgical area with concurrent Xeloda 09/13/2019-10/20/2019 ? CTs 11/29/2019-multiple small pulmonary nodules scattered throughout the lungs bilaterally, appear increased in number and size. No definite evidence of metastatic disease in the abdomen or pelvis. Markedly enlarged and  heterogeneous appearing uterus, likely to represent multifocal fibroids. 1 of these lesions appears to be an exophytic subserosal fibroid in the posterior  lateral aspect of the uterine body on the left side although this comes in close proximity to the left adnexa such that a primary ovarian lesion is difficult to completely exclude. ? CTs 02/07/2020-slight enlargement of bilateral lung nodules, some are cavitary, no evidence of metastatic disease in the abdomen or pelvis, stable right adrenal nodule, uterine fibroids ? CTs 04/26/2020-mild enlargement of pulmonary nodules, slight increase in trace pelvic fluid, new soft tissue thickening inferior to the cecal tip suspicious for peritoneal metastasis ? CT 05/26/2020-improved appearance of soft tissue at the inferior tip of the cecum, mildly thickened short appendix-findings suggestive of resolving appendicitis, stable small bibasilar pulmonary nodules, fibroids ? Plan biopsy of right cecal tip soft tissue canceled secondary to radiologic improvement  2. 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 Diabetes 4. Hypertension 5. Family history of pancreas cancer, INVITAE panel-VUS in the TERT 6. Port-A-Cath placement, Dr. 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 Mild lower abdominal pain after exercise, likely MSK related (04/04/20)      Disposition: Alyssa Ross appears well.  The abdomen CT on 05/26/2020 revealed significant improvement in the soft tissue thickening at the cecum.  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  A biopsy was canceled.  She appears to have metastatic lung nodules and she is asymptomatic.  She will return for an office visit and repeat CT of the chest in 2 months.  She will return for Port-A-Cath flush in 2-3 weeks.  Alyssa Ross received the COVID-19 booster vaccine today.  [name], MD  05/31/2020  9:25 AM

## 2020-06-06 ENCOUNTER — Ambulatory Visit (HOSPITAL_COMMUNITY): Payer: BC Managed Care – PPO

## 2020-06-21 ENCOUNTER — Other Ambulatory Visit: Payer: Self-pay

## 2020-06-21 ENCOUNTER — Inpatient Hospital Stay: Payer: BC Managed Care – PPO | Attending: Genetic Counselor

## 2020-06-21 DIAGNOSIS — C250 Malignant neoplasm of head of pancreas: Secondary | ICD-10-CM

## 2020-06-21 DIAGNOSIS — Z452 Encounter for adjustment and management of vascular access device: Secondary | ICD-10-CM | POA: Insufficient documentation

## 2020-06-21 DIAGNOSIS — C259 Malignant neoplasm of pancreas, unspecified: Secondary | ICD-10-CM | POA: Diagnosis not present

## 2020-06-21 DIAGNOSIS — Z95828 Presence of other vascular implants and grafts: Secondary | ICD-10-CM

## 2020-06-21 MED ORDER — SODIUM CHLORIDE 0.9% FLUSH
10.0000 mL | Freq: Once | INTRAVENOUS | Status: AC
  Administered 2020-06-21: 10 mL
  Filled 2020-06-21: qty 10

## 2020-06-21 MED ORDER — HEPARIN SOD (PORK) LOCK FLUSH 100 UNIT/ML IV SOLN
500.0000 [IU] | Freq: Once | INTRAVENOUS | Status: AC
  Administered 2020-06-21: 500 [IU]
  Filled 2020-06-21: qty 5

## 2020-07-04 DIAGNOSIS — R928 Other abnormal and inconclusive findings on diagnostic imaging of breast: Secondary | ICD-10-CM | POA: Diagnosis not present

## 2020-07-04 DIAGNOSIS — N6321 Unspecified lump in the left breast, upper outer quadrant: Secondary | ICD-10-CM | POA: Diagnosis not present

## 2020-07-06 ENCOUNTER — Other Ambulatory Visit: Payer: Self-pay | Admitting: Radiology

## 2020-07-06 DIAGNOSIS — N6322 Unspecified lump in the left breast, upper inner quadrant: Secondary | ICD-10-CM | POA: Diagnosis not present

## 2020-07-06 DIAGNOSIS — N6321 Unspecified lump in the left breast, upper outer quadrant: Secondary | ICD-10-CM | POA: Diagnosis not present

## 2020-07-10 ENCOUNTER — Other Ambulatory Visit: Payer: Self-pay | Admitting: Internal Medicine

## 2020-07-10 ENCOUNTER — Other Ambulatory Visit: Payer: Self-pay | Admitting: Endocrinology

## 2020-07-10 DIAGNOSIS — C250 Malignant neoplasm of head of pancreas: Secondary | ICD-10-CM

## 2020-07-25 ENCOUNTER — Other Ambulatory Visit (INDEPENDENT_AMBULATORY_CARE_PROVIDER_SITE_OTHER): Payer: BC Managed Care – PPO

## 2020-07-25 ENCOUNTER — Other Ambulatory Visit: Payer: Self-pay

## 2020-07-25 DIAGNOSIS — E1165 Type 2 diabetes mellitus with hyperglycemia: Secondary | ICD-10-CM | POA: Diagnosis not present

## 2020-07-25 DIAGNOSIS — Z794 Long term (current) use of insulin: Secondary | ICD-10-CM | POA: Diagnosis not present

## 2020-07-25 LAB — BASIC METABOLIC PANEL
BUN: 9 mg/dL (ref 6–23)
CO2: 32 mEq/L (ref 19–32)
Calcium: 9.3 mg/dL (ref 8.4–10.5)
Chloride: 107 mEq/L (ref 96–112)
Creatinine, Ser: 0.98 mg/dL (ref 0.40–1.20)
GFR: 65.48 mL/min (ref >60.00)
Glucose, Bld: 98 mg/dL (ref 70–99)
Potassium: 3.5 mEq/L (ref 3.5–5.1)
Sodium: 143 mEq/L (ref 135–145)

## 2020-07-25 LAB — HEMOGLOBIN A1C: Hgb A1c MFr Bld: 8 % — ABNORMAL HIGH (ref 4.6–6.5)

## 2020-07-27 ENCOUNTER — Encounter: Payer: Self-pay | Admitting: Internal Medicine

## 2020-07-31 ENCOUNTER — Ambulatory Visit: Payer: BC Managed Care – PPO | Admitting: Internal Medicine

## 2020-07-31 ENCOUNTER — Encounter: Payer: Self-pay | Admitting: Internal Medicine

## 2020-07-31 ENCOUNTER — Other Ambulatory Visit: Payer: Self-pay

## 2020-07-31 VITALS — BP 142/90 | HR 87 | Temp 97.7°F | Ht 67.8 in | Wt 195.8 lb

## 2020-07-31 DIAGNOSIS — C250 Malignant neoplasm of head of pancreas: Secondary | ICD-10-CM | POA: Diagnosis not present

## 2020-07-31 DIAGNOSIS — T451X5A Adverse effect of antineoplastic and immunosuppressive drugs, initial encounter: Secondary | ICD-10-CM

## 2020-07-31 DIAGNOSIS — G620 Drug-induced polyneuropathy: Secondary | ICD-10-CM | POA: Diagnosis not present

## 2020-07-31 DIAGNOSIS — Z794 Long term (current) use of insulin: Secondary | ICD-10-CM

## 2020-07-31 DIAGNOSIS — Z0000 Encounter for general adult medical examination without abnormal findings: Secondary | ICD-10-CM

## 2020-07-31 DIAGNOSIS — E1165 Type 2 diabetes mellitus with hyperglycemia: Secondary | ICD-10-CM

## 2020-07-31 DIAGNOSIS — Z23 Encounter for immunization: Secondary | ICD-10-CM

## 2020-07-31 DIAGNOSIS — I10 Essential (primary) hypertension: Secondary | ICD-10-CM

## 2020-07-31 DIAGNOSIS — E785 Hyperlipidemia, unspecified: Secondary | ICD-10-CM | POA: Diagnosis not present

## 2020-07-31 LAB — LIPID PANEL
Chol/HDL Ratio: 2.8 ratio (ref 0.0–4.4)
Cholesterol, Total: 162 mg/dL (ref 100–199)
HDL: 58 mg/dL (ref >39)
LDL Chol Calc (NIH): 81 mg/dL (ref 0–99)
Triglycerides: 131 mg/dL (ref 0–149)
VLDL Cholesterol Cal: 23 mg/dL (ref 5–40)

## 2020-07-31 LAB — POCT URINALYSIS DIPSTICK
Bilirubin, UA: NEGATIVE
Blood, UA: NEGATIVE
Glucose, UA: NEGATIVE
Ketones, UA: NEGATIVE
Leukocytes, UA: NEGATIVE
Nitrite, UA: NEGATIVE
Protein, UA: NEGATIVE
Spec Grav, UA: 1.025 (ref 1.010–1.025)
Urobilinogen, UA: 0.2 E.U./dL
pH, UA: 5.5 (ref 5.0–8.0)

## 2020-07-31 LAB — POCT UA - MICROALBUMIN
Albumin/Creatinine Ratio, Urine, POC: <30
Creatinine, POC: 300 mg/dL
Microalbumin Ur, POC: 30 mg/L

## 2020-07-31 NOTE — Progress Notes (Signed)
I,Katawbba Wiggins,acting as a Education administrator for Maximino Greenland, MD.,have documented all relevant documentation on the behalf of Maximino Greenland, MD,as directed by  Maximino Greenland, MD while in the presence of Maximino Greenland, MD.  This visit occurred during the SARS-CoV-2 public health emergency.  Safety protocols were in place, including screening questions prior to the visit, additional usage of staff PPE, and extensive cleaning of exam room while observing appropriate contact time as indicated for disinfecting solutions.  Subjective:     Patient ID: Alyssa Ross , female    DOB: 31-Dec-1965 , 55 y.o.   MRN: [id number]   Chief Complaint  Patient presents with  . Annual Exam  . Diabetes  . Hypertension    HPI  She is here today for a full physical examination. She is followed by Dr. Charlesetta Garibaldi for her GYN care. She is also followed by Dr. Dwyane Dee for her diabetes. She reports compliance with meds. She denies headaches, chest pain and shortness of breath.   Diabetes She presents for her follow-up diabetic visit. She has type 2 diabetes mellitus. There are no hypoglycemic associated symptoms. Pertinent negatives for diabetes include no blurred vision and no chest pain. There are no hypoglycemic complications. Risk factors for coronary artery disease include diabetes mellitus, hypertension and dyslipidemia. Current diabetic treatment includes insulin injections. She is compliant with treatment most of the time. She is following a diabetic diet. She participates in exercise intermittently. Eye exam is current.  Hypertension This is a chronic problem. The current episode started more than 1 year ago. The problem has been gradually improving since onset. The problem is uncontrolled. Pertinent negatives include no blurred vision, chest pain, palpitations or shortness of breath. Past treatments include ACE inhibitors and diuretics. The current treatment provides moderate improvement.     Past Medical History:   Diagnosis Date  . Anemia   . ASCUS (atypical squamous cells of undetermined significance) on Pap smear 08/06/1999  . Breast mass in female 2002   Left  . Cancer [facility])    Neuroendocrine Tumor of the pancreas  . Diabetes mellitus without complication ([place])   . Family history of pancreatic cancer   . Fibroid uterus 2010  . Hypertension   . Irregular bleeding 2011  . 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 PONV (postoperative nausea and vomiting)    nausea  vomitting after 12/25/18 ERCP  . 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 Yeast vaginitis 2006     Family History  Problem Relation Age of Onset  . Diabetes Mother   . Dementia Mother   . Hyperlipidemia Mother   . Hypertension Mother   . Irregular heart beat Mother   . Diabetes Father   . Hypertension Father   . Hyperlipidemia Father   . Down syndrome Sister   . Diabetes Sister   . Hyperlipidemia Brother   . Heart Problems Brother   . Goiter Maternal Aunt   . Thyroid nodules Sister   . Cancer Cousin 60       eye; maternal first cousin  . Goiter Cousin   . Cancer Paternal Aunt        unknown form of cancer  . Cancer Cousin        unknown form of cancer; paternal first cousin  . Pancreatic cancer Cousin 26       paternal first cousin  . Cancer Cousin 31       unknown cancer; paternal first cousin  . Cancer  Cousin 17       unknown cancer; paternal first cousin     Current Outpatient Medications:  .  Continuous Blood Gluc Receiver (FREESTYLE LIBRE 14 DAY READER) DEVI, USE READER TO CHECK BLOOD GLUCOSE WITH FREESTYLE LIBRE SENSORS., Disp: 1 Device, Rfl: 0 .  Continuous Blood Gluc Sensor (FREESTYLE LIBRE 2 SENSOR) MISC, 2 Devices by Does not apply route every 14 (fourteen) days., Disp: 2 each, Rfl: 3 .  glucose blood test strip, Check sugar 2 times daily., Disp: 100 each, Rfl: 12 .  HUMALOG MIX 75/25 KWIKPEN (75-25) 100 UNIT/ML Kwikpen, ADMINISTER 24 UNITS UNDER THE SKIN EVERY DAY  BEFORE LUNCH (Patient taking differently: Inject 18 Units into the skin daily before breakfast.), Disp: 30 mL, Rfl: 2 .  Insulin Degludec (TRESIBA FLEXTOUCH [place]), Inject into the skin. 10 units daily, Disp: , Rfl:  .  insulin lispro (HUMALOG KWIKPEN) 100 UNIT/ML KwikPen, Inject 20 units under the skin three times daily before meals. (Patient taking differently: Inject 3-20 Units into the skin 3 (three) times daily as needed (high blood sugar).), Disp: 60 pen, Rfl: 3 .  KLOR-CON M20 20 MEQ tablet, TAKE 1 TABLET DAILY, Disp: 90 tablet, Rfl: 3 .  lidocaine-prilocaine (EMLA) cream, Apply 1 application topically as directed. Apply 1 hour prior to stick and cover with plastic wrap (Patient taking differently: Apply 1 application topically daily as needed (port access). Apply 1 hour prior to stick and cover with plastic wrap), Disp: 30 g, Rfl: 5 .  lisinopril-hydrochlorothiazide (ZESTORETIC) 10-12.5 MG tablet, TAKE 1 TABLET DAILY, Disp: 90 tablet, Rfl: 3 .  loratadine (CLARITIN) 10 MG tablet, Take 10 mg by mouth at bedtime. , Disp: , Rfl:  .  magnesium oxide (MAG-OX) 400 (241.3 Mg) MG tablet, TAKE 1 TABLET(400 MG) BY MOUTH TWICE DAILY (Patient taking differently: Take 400 mg by mouth 2 (two) times daily.), Disp: 60 tablet, Rfl: 2 .  metFORMIN (GLUCOPHAGE-XR) 500 MG 24 hr tablet, TAKE 4 TABLETS ONCE DAILY (Patient taking differently: Take 2,000 mg by mouth at bedtime.), Disp: 360 tablet, Rfl: 3 .  NOVOFINE PLUS PEN NEEDLE 32G X 4 MM MISC, USE TO INJECT INSULIN THREE TIMES DAILY, Disp: 300 each, Rfl: 1 .  rosuvastatin (CRESTOR) 10 MG tablet, Take 10 mg by mouth at bedtime., Disp: , Rfl:  No current facility-administered medications for this visit.  Facility-Administered Medications Ordered in Other Visits:  .  sodium chloride flush (NS) 0.9 % injection 10 mL, 10 mL, Intracatheter, PRN, Ladell Pier, MD, 10 mL at 05/27/19 1342   Allergies  Allergen Reactions  . Cherry Rash  . Lemon Oil Rash      The  patient states she uses none for birth control. Last LMP was Patient's last menstrual period was 09/17/2012.. Negative for Dysmenorrhea. Negative for: breast discharge, breast lump(s), breast pain and breast self exam. Associated symptoms include abnormal vaginal bleeding. Pertinent negatives include abnormal bleeding (hematology), anxiety, decreased libido, depression, difficulty falling sleep, dyspareunia, history of infertility, nocturia, sexual dysfunction, sleep disturbances, urinary incontinence, urinary urgency, vaginal discharge and vaginal itching. Diet regular.The patient states her exercise level is  intermittent.  . The patient's tobacco use is:  Social History   Tobacco Use  Smoking Status Never Smoker  Smokeless Tobacco Never Used  . She has been exposed to passive smoke. The patient's alcohol use is:  Social History   Substance and Sexual Activity  Alcohol Use Yes   Comment: rarely   Review of Systems  Constitutional: Negative.  HENT: Negative.   Eyes: Negative.  Negative for blurred vision.  Respiratory: Negative.  Negative for shortness of breath.   Cardiovascular: Negative.  Negative for chest pain and palpitations.  Gastrointestinal: Negative.   Endocrine: Negative.   Genitourinary: Negative.   Musculoskeletal: Negative.   Skin: Negative.   Allergic/Immunologic: Negative.   Neurological: Negative.   Hematological: Negative.   Psychiatric/Behavioral: Negative.      Today's Vitals   07/31/20 0917  BP: (!) 142/90  Pulse: 87  Temp: 97.7 F (36.5 C)  TempSrc: Oral  Weight: 195 lb 12.8 oz (88.8 kg)  Height: 5' 7.8" (1.722 m)   Body mass index is 29.95 kg/m.  Wt Readings from Last 3 Encounters:  07/31/20 195 lb 12.8 oz (88.8 kg)  05/31/20 191 lb 4.8 oz (86.8 kg)  05/26/20 188 lb (85.3 kg)   BP Readings from Last 3 Encounters:  07/31/20 (!) 142/90  05/31/20 127/87  05/26/20 136/86     Objective:  Physical Exam Vitals and nursing note reviewed.   Constitutional:      Appearance: Normal appearance.  HENT:     Head: Normocephalic and atraumatic.     Right Ear: Tympanic membrane, ear canal and external ear normal.     Left Ear: Tympanic membrane, ear canal and external ear normal.     Nose:     Comments: Masked     Mouth/Throat:     Comments: Masked  Eyes:     Extraocular Movements: Extraocular movements intact.     Conjunctiva/sclera: Conjunctivae normal.     Pupils: Pupils are equal, round, and reactive to light.  Cardiovascular:     Rate and Rhythm: Normal rate and regular rhythm.     Pulses: Normal pulses.          Dorsalis pedis pulses are 2+ on the right side and 2+ on the left side.     Heart sounds: Normal heart sounds.  Pulmonary:     Effort: Pulmonary effort is normal.     Breath sounds: Normal breath sounds.  Chest:  Breasts:     Tanner Score is 5.     Right: Normal.     Left: Normal.      Comments: Area of dense tissue left breast, about 2 oclock Abdominal:     General: Abdomen is flat. Bowel sounds are normal.     Palpations: Abdomen is soft.     Comments: Midline healed surgical scar  Genitourinary:    Comments: deferred Musculoskeletal:        General: Normal range of motion.     Cervical back: Normal range of motion and neck supple.  Feet:     Right foot:     Protective Sensation: 5 sites tested. 5 sites sensed.     Skin integrity: Skin integrity normal.     Toenail Condition: Right toenails are normal.     Left foot:     Protective Sensation: 5 sites tested. 5 sites sensed.     Skin integrity: Skin integrity normal.     Toenail Condition: Left toenails are normal.  Skin:    General: Skin is warm and dry.  Neurological:     General: No focal deficit present.     Mental Status: She is alert and oriented to person, place, and time.  Psychiatric:        Mood and Affect: Mood normal.        Behavior: Behavior normal.         Assessment  And Plan:     1. Routine general medical examination  at health care facility Comments: A full exam was performed. Importance of monthly self breast exams was discussed with the patient. PATIENT IS ADVISED TO GET 30-45 MINUTES REGULAR EXERCISE NO LESS THAN FOUR TO FIVE DAYS PER WEEK - BOTH WEIGHTBEARING EXERCISES AND AEROBIC ARE RECOMMENDED.  PATIENT IS ADVISED TO FOLLOW A HEALTHY DIET WITH AT LEAST SIX FRUITS/VEGGIES PER DAY, DECREASE INTAKE OF RED MEAT, AND TO INCREASE FISH INTAKE TO TWO DAYS PER WEEK.  MEATS/FISH SHOULD NOT BE FRIED, BAKED OR BROILED IS PREFERABLE.  I SUGGEST WEARING SPF 50 SUNSCREEN ON EXPOSED PARTS AND ESPECIALLY WHEN IN THE DIRECT SUNLIGHT FOR AN EXTENDED PERIOD OF TIME.  PLEASE AVOID FAST FOOD RESTAURANTS AND INCREASE YOUR WATER INTAKE.  - CBC - Lipid panel - Liver Profile  2. Uncontrolled type 2 diabetes mellitus with hyperglycemia, with long-term current use of insulin ([facility]) Comments: Diabetic foot exam was performed. I DISCUSSED WITH THE PATIENT AT LENGTH REGARDING THE GOALS OF GLYCEMIC CONTROL AND POSSIBLE LONG-TERM COMPLICATIONS.  I  ALSO STRESSED THE IMPORTANCE OF COMPLIANCE WITH HOME GLUCOSE MONITORING, DIETARY RESTRICTIONS INCLUDING AVOIDANCE OF SUGARY DRINKS/PROCESSED FOODS,  ALONG WITH REGULAR EXERCISE.  I  ALSO STRESSED THE IMPORTANCE OF ANNUAL EYE EXAMS, SELF FOOT CARE AND COMPLIANCE WITH OFFICE VISITS.  - POCT Urinalysis Dipstick (81002) - POCT UA - Microalbumin  3. Essential hypertension Comments: Chronic, uncontrolled. She will c/w lisinopril/hctz as of now. She is reminded to follow a low sodium diet.  EKG performed, NSR w/o acute changes. She will f/u in 6 months for re-evaluation.  - EKG 12-Lead  4. Chemotherapy-induced neuropathy (HCC) Comments: Chronic. Her sx are slowly improving.Advised by [facility] that vitamin B12 supplementation will not be helpful.  5. Malignant neoplasm of head of pancreas [facility]) Comments: She is s/p surgical resection and chemotherapy with FOLFIRINOX.  6. Immunization due Comments:  She does not wish to get pneumovax today. I will discuss at next visit.   Patient was given opportunity to ask questions. Patient verbalized understanding of the plan and was able to repeat key elements of the plan. All questions were answered to their satisfaction.   I, Maximino Greenland, MD, have reviewed all documentation for this visit. The documentation on 07/31/20 for the exam, diagnosis, procedures, and orders are all accurate and complete.  THE PATIENT IS ENCOURAGED TO PRACTICE SOCIAL DISTANCING DUE TO THE COVID-19 PANDEMIC.

## 2020-07-31 NOTE — Patient Instructions (Addendum)
Magnesium glycinate, magnesium threonate  Health Maintenance, Female Adopting a healthy lifestyle and getting preventive care are important in promoting health and wellness. Ask your health care provider about:  The right schedule for you to have regular tests and exams.  Things you can do on your own to prevent diseases and keep yourself healthy. What should I know about diet, weight, and exercise? Eat a healthy diet  Eat a diet that includes plenty of vegetables, fruits, low-fat dairy products, and lean protein.  Do not eat a lot of foods that are high in solid fats, added sugars, or sodium.   Maintain a healthy weight Body mass index (BMI) is used to identify weight problems. It estimates body fat based on height and weight. Your health care provider can help determine your BMI and help you achieve or maintain a healthy weight. Get regular exercise Get regular exercise. This is one of the most important things you can do for your health. Most adults should:  Exercise for at least 150 minutes each week. The exercise should increase your heart rate and make you sweat (moderate-intensity exercise).  Do strengthening exercises at least twice a week. This is in addition to the moderate-intensity exercise.  Spend less time sitting. Even light physical activity can be beneficial. Watch cholesterol and blood lipids Have your blood tested for lipids and cholesterol at 55 years of age, then have this test every 5 years. Have your cholesterol levels checked more often if:  Your lipid or cholesterol levels are high.  You are older than 55 years of age.  You are at high risk for heart disease. What should I know about cancer screening? Depending on your health history and family history, you may need to have cancer screening at various ages. This may include screening for:  Breast cancer.  Cervical cancer.  Colorectal cancer.  Skin cancer.  Lung cancer. What should I know about  heart disease, diabetes, and high blood pressure? Blood pressure and heart disease  High blood pressure causes heart disease and increases the risk of stroke. This is more likely to develop in people who have high blood pressure readings, are of African descent, or are overweight.  Have your blood pressure checked: ? Every 3-5 years if you are 39-49 years of age. ? Every year if you are 34 years old or older. Diabetes Have regular diabetes screenings. This checks your fasting blood sugar level. Have the screening done:  Once every three years after age 26 if you are at a normal weight and have a low risk for diabetes.  More often and at a younger age if you are overweight or have a high risk for diabetes. What should I know about preventing infection? Hepatitis B If you have a higher risk for hepatitis B, you should be screened for this virus. Talk with your health care provider to find out if you are at risk for hepatitis B infection. Hepatitis C Testing is recommended for:  Everyone born from [birth year] through 1965.  Anyone with known risk factors for hepatitis C. Sexually transmitted infections (STIs)  Get screened for STIs, including gonorrhea and chlamydia, if: ? You are sexually active and are younger than 55 years of age. ? You are older than 55 years of age and your health care provider tells you that you are at risk for this type of infection. ? Your sexual activity has changed since you were last screened, and you are at increased risk for chlamydia or gonorrhea.  Ask your health care provider if you are at risk.  Ask your health care provider about whether you are at high risk for HIV. Your health care provider may recommend a prescription medicine to help prevent HIV infection. If you choose to take medicine to prevent HIV, you should first get tested for HIV. You should then be tested every 3 months for as long as you are taking the medicine. Pregnancy  If you are about to  stop having your period (premenopausal) and you may become pregnant, seek counseling before you get pregnant.  Take 400 to 800 micrograms (mcg) of folic acid every day if you become pregnant.  Ask for birth control (contraception) if you want to prevent pregnancy. Osteoporosis and menopause Osteoporosis is a disease in which the bones lose minerals and strength with aging. This can result in bone fractures. If you are 65 years old or older, or if you are at risk for osteoporosis and fractures, ask your health care provider if you should:  Be screened for bone loss.  Take a calcium or vitamin D supplement to lower your risk of fractures.  Be given hormone replacement therapy (HRT) to treat symptoms of menopause. Follow these instructions at home: Lifestyle  Do not use any products that contain nicotine or tobacco, such as cigarettes, e-cigarettes, and chewing tobacco. If you need help quitting, ask your health care provider.  Do not use street drugs.  Do not share needles.  Ask your health care provider for help if you need support or information about quitting drugs. Alcohol use  Do not drink alcohol if: ? Your health care provider tells you not to drink. ? You are pregnant, may be pregnant, or are planning to become pregnant.  If you drink alcohol: ? Limit how much you use to 0-1 drink a day. ? Limit intake if you are breastfeeding.  Be aware of how much alcohol is in your drink. In the [place] one drink equals one 12 oz bottle of beer (355 mL), one 5 oz glass of wine (148 mL), or one 1 oz glass of hard liquor (44 mL). General instructions  Schedule regular health, dental, and eye exams.  Stay current with your vaccines.  Tell your health care provider if: ? You often feel depressed. ? You have ever been abused or do not feel safe at home. Summary  Adopting a healthy lifestyle and getting preventive care are important in promoting health and wellness.  Follow your  health care provider's instructions about healthy diet, exercising, and getting tested or screened for diseases.  Follow your health care provider's instructions on monitoring your cholesterol and blood pressure. This information is not intended to replace advice given to you by your health care provider. Make sure you discuss any questions you have with your health care provider. Document Revised: 05/20/2018 Document Reviewed: 05/20/2018 Elsevier Patient Education  2021 [employer].

## 2020-08-01 ENCOUNTER — Inpatient Hospital Stay: Payer: BC Managed Care – PPO

## 2020-08-01 ENCOUNTER — Ambulatory Visit (HOSPITAL_COMMUNITY)
Admission: RE | Admit: 2020-08-01 | Discharge: 2020-08-01 | Disposition: A | Discharge status: Home / Self Care | Payer: BC Managed Care – PPO | Source: Ambulatory Visit | Attending: Oncology | Admitting: Oncology

## 2020-08-01 ENCOUNTER — Encounter (HOSPITAL_COMMUNITY): Payer: Self-pay

## 2020-08-01 ENCOUNTER — Other Ambulatory Visit: Payer: Self-pay

## 2020-08-01 ENCOUNTER — Inpatient Hospital Stay: Payer: BC Managed Care – PPO | Attending: Genetic Counselor

## 2020-08-01 DIAGNOSIS — C250 Malignant neoplasm of head of pancreas: Secondary | ICD-10-CM | POA: Diagnosis not present

## 2020-08-01 DIAGNOSIS — I10 Essential (primary) hypertension: Secondary | ICD-10-CM | POA: Insufficient documentation

## 2020-08-01 DIAGNOSIS — E119 Type 2 diabetes mellitus without complications: Secondary | ICD-10-CM | POA: Diagnosis not present

## 2020-08-01 DIAGNOSIS — I700 Atherosclerosis of aorta: Secondary | ICD-10-CM | POA: Diagnosis not present

## 2020-08-01 DIAGNOSIS — Z95828 Presence of other vascular implants and grafts: Secondary | ICD-10-CM

## 2020-08-01 DIAGNOSIS — I2510 Atherosclerotic heart disease of native coronary artery without angina pectoris: Secondary | ICD-10-CM | POA: Diagnosis not present

## 2020-08-01 DIAGNOSIS — C259 Malignant neoplasm of pancreas, unspecified: Secondary | ICD-10-CM | POA: Diagnosis not present

## 2020-08-01 DIAGNOSIS — R918 Other nonspecific abnormal finding of lung field: Secondary | ICD-10-CM | POA: Insufficient documentation

## 2020-08-01 LAB — CBC
Hematocrit: 36.8 % (ref 34.0–46.6)
Hemoglobin: 11.8 g/dL (ref 11.1–15.9)
MCH: 30.5 pg (ref 26.6–33.0)
MCHC: 32.1 g/dL (ref 31.5–35.7)
MCV: 95 fL (ref 79–97)
Platelets: 340 10*3/uL (ref 150–450)
RBC: 3.87 x10E6/uL (ref 3.77–5.28)
RDW: 12.5 % (ref 11.7–15.4)
WBC: 4.6 10*3/uL (ref 3.4–10.8)

## 2020-08-01 LAB — BASIC METABOLIC PANEL - CANCER CENTER ONLY
Anion gap: 7 (ref 5–15)
BUN: 9 mg/dL (ref 6–20)
CO2: 25 mmol/L (ref 22–32)
Calcium: 9.4 mg/dL (ref 8.9–10.3)
Chloride: 108 mmol/L (ref 98–111)
Creatinine: 1.1 mg/dL — ABNORMAL HIGH (ref 0.44–1.00)
GFR, Estimated: 60 mL/min — ABNORMAL LOW (ref >60)
Glucose, Bld: 179 mg/dL — ABNORMAL HIGH (ref 70–99)
Potassium: 3.5 mmol/L (ref 3.5–5.1)
Sodium: 140 mmol/L (ref 135–145)

## 2020-08-01 LAB — HEPATIC FUNCTION PANEL
ALT: 46 IU/L — ABNORMAL HIGH (ref 0–32)
AST: 39 IU/L (ref 0–40)
Albumin: 4.1 g/dL (ref 3.8–4.9)
Alkaline Phosphatase: 139 IU/L — ABNORMAL HIGH (ref 44–121)
Bilirubin Total: 0.4 mg/dL (ref 0.0–1.2)
Bilirubin, Direct: 0.11 mg/dL (ref 0.00–0.40)
Total Protein: 7.1 g/dL (ref 6.0–8.5)

## 2020-08-01 LAB — MAGNESIUM: Magnesium: 1.6 mg/dL — ABNORMAL LOW (ref 1.7–2.4)

## 2020-08-01 MED ORDER — IOHEXOL 300 MG/ML  SOLN
75.0000 mL | Freq: Once | INTRAMUSCULAR | Status: AC | PRN
  Administered 2020-08-01: 75 mL via INTRAVENOUS

## 2020-08-01 MED ORDER — HEPARIN SOD (PORK) LOCK FLUSH 100 UNIT/ML IV SOLN
500.0000 [IU] | Freq: Once | INTRAVENOUS | Status: AC
  Administered 2020-08-01: 500 [IU] via INTRAVENOUS

## 2020-08-01 MED ORDER — HEPARIN SOD (PORK) LOCK FLUSH 100 UNIT/ML IV SOLN
INTRAVENOUS | Status: AC
  Filled 2020-08-01: qty 5

## 2020-08-01 MED ORDER — SODIUM CHLORIDE 0.9% FLUSH
10.0000 mL | Freq: Once | INTRAVENOUS | Status: AC
  Administered 2020-08-01: 10 mL
  Filled 2020-08-01: qty 10

## 2020-08-01 NOTE — Progress Notes (Signed)
Patient ID: Alyssa Ross, female   DOB: [birth year], 55 y.o.   MRN: [id number]  I connected with the above-named patient by video enabled telemedicine application and verified that I am speaking with the correct person. The patient was explained the limitations of evaluation and management by telemedicine and the availability of in person appointments.  Patient also understood that there may be a patient responsible charge related to this service . Location of the patient: Patient's home . Location of the provider: Physician office Only the patient and myself were participating in the encounter The patient understood the above statements and agreed to proceed.   Reason for Appointment: Diabetes follow-up    History of Present Illness   Diagnosis: Type 2 DIABETES MELITUS, date of diagnosis 2005   She had previously been on metformin and Amaryl Because of inadequate control she was given Victoza in addition in 2011 With this her blood sugars have been significantly better and her A1c has been as low as 6.2 in 2013 Her blood sugars have been more difficult to control since 2014 an A1c consistently over 7% She  was started on insulin with Levemir in 06/2013 because of  increase in her A1c to 8.5% She had previously been taking Victoza along with Amaryl and metformin without consistent control Also had difficulty losing weight despite taking 1.8 mg Victoza, also had tried the 3 mg dose Because of episodic nausea and diarrhea in 1/16 she went off her Victoza  RECENT history:   INSULIN dose: TRESIBA 12 units pm.  Humalog mix 75/25, 18 units at breakfast, Humalog 5 to 10 units before meals  Non-insulin hypoglycemic drugs: on metformin 2000 mg,  A1c is 8 compared to 8.5 Fructosamine most recently 349  Current management, blood sugars and problems identified as follows  She has not had any improvement in her blood sugars compared to the last visit  She was told to try and  increase her Humalog more consistently and also try to leave off [place] to avoid overnight hypoglycemia  She thinks that blood sugars are higher if she does not take [place] but HIGHEST blood sugars are late at night highest reading about 389.  Still not exercising and she does not find the time to do so  Her weight appears to be going up  Even though she has blood sugars frequently over 200 after dinner she did not take more than 12 units to cover her meals  With reducing her Humalog mix to 24 units she has not had any low sugars during the day but still occasionally will have readings in the 60s early afternoon  HYPERGLYCEMIC spikes appear to be inconsistent and may occasionally occur after lunch  CGM use % of time  96  2-week average/GV  194/36  Time in range    49    % was 47  % Time Above 180  30  % Time above 250  21  % Time Below 70 0     PRE-MEAL Fasting Lunch Dinner Bedtime Overall  Glucose range:       Averages:  142  189  200  245    POST-MEAL PC Breakfast PC Lunch PC Dinner  Glucose range:     Averages:  170  180  242    Previously:  CGM use % of time  73  2-week average/SD  189  Time in range  47      %  % Time Above 180  33  %  Time above 250  18  % Time Below 70 2     PRE-MEAL Fasting Lunch Dinner Bedtime Overall  Glucose range:        Averages: 146  220  238     POST-MEAL PC Breakfast PC Lunch PC Dinner  Glucose range:     Averages:  205  217  207    PREVIOUS data:    PRE-MEAL Fasting Lunch Dinner Bedtime Overall  Glucose range:       Averages:  146  220  235   164   POST-MEAL PC Breakfast PC Lunch PC Dinner  Glucose range:     Averages:  205  217  207      Meals: 3 meals per day. Dinner 8-9 pm     DIET: As above         Wt Readings from Last 3 Encounters:  07/31/20 195 lb 12.8 oz (88.8 kg)  05/31/20 191 lb 4.8 oz (86.8 kg)  05/26/20 188 lb (85.3 kg)    LABS:  Lab Results  Component Value Date   HGBA1C 8.0 (H) 07/25/2020    HGBA1C 8.5 (H) 03/09/2020   HGBA1C 7.7 (H) 12/07/2019   Lab Results  Component Value Date   MICROALBUR 30 07/31/2020   LDLCALC 81 07/31/2020   CREATININE 1.10 (H) 08/01/2020    Appointment on 08/01/2020  Component Date Value Ref Range Status  . Sodium 08/01/2020 140  135 - 145 mmol/L Final  . Potassium 08/01/2020 3.5  3.5 - 5.1 mmol/L Final  . Chloride 08/01/2020 108  98 - 111 mmol/L Final  . CO2 08/01/2020 25  22 - 32 mmol/L Final  . Glucose, Bld 08/01/2020 179* 70 - 99 mg/dL Final   Glucose reference range applies only to samples taken after fasting for at least 8 hours.  . BUN 08/01/2020 9  6 - 20 mg/dL Final  . Creatinine 08/01/2020 1.10* 0.44 - 1.00 mg/dL Final  . Calcium 08/01/2020 9.4  8.9 - 10.3 mg/dL Final  . GFR, Estimated 08/01/2020 60* >60 mL/min Final   Comment: (NOTE) Calculated using the CKD-EPI Creatinine Equation (2021)   . Anion gap 08/01/2020 7  5 - 15 Final   Performed at [facility], [place] [address], [place], [place] [address]  . Magnesium 08/01/2020 1.6* 1.7 - 2.4 mg/dL Final   Performed at [facility], [place] [address], [place], [place] [address]  Office Visit on 07/31/2020  Component Date Value Ref Range Status  . Color, UA 07/31/2020 yellow   Final  . Clarity, UA 07/31/2020 clear   Final  . Glucose, UA 07/31/2020 Negative  Negative Final  . Bilirubin, UA 07/31/2020 Negative   Final  . Ketones, UA 07/31/2020 Negative   Final  . Spec Grav, UA 07/31/2020 1.025  1.010 - 1.025 Final  . Blood, UA 07/31/2020 Negative   Final  . pH, UA 07/31/2020 5.5  5.0 - 8.0 Final  . Protein, UA 07/31/2020 Negative  Negative Final  . Urobilinogen, UA 07/31/2020 0.2  0.2 or 1.0 E.U./dL Final  . Nitrite, UA 07/31/2020 Negative   Final  . Leukocytes, UA 07/31/2020 Negative  Negative Final  . Microalbumin Ur, POC 07/31/2020 30  mg/L Final  . Creatinine, POC 07/31/2020 300  mg/dL Final  . Albumin/Creatinine Ratio, Urine, P*  07/31/2020 <30   Final   mg/g  . WBC 07/31/2020 4.6  3.4 - 10.8 x10E3/uL Final  . RBC 07/31/2020 3.87  3.77 -  5.28 x10E6/uL Final  . Hemoglobin 07/31/2020 11.8  11.1 - 15.9 g/dL Final  . Hematocrit 07/31/2020 36.8  34.0 - 46.6 % Final  . MCV 07/31/2020 95  79 - 97 fL Final  . MCH 07/31/2020 30.5  26.6 - 33.0 pg Final  . MCHC 07/31/2020 32.1  31.5 - 35.7 g/dL Final  . RDW 07/31/2020 12.5  11.7 - 15.4 % Final  . Platelets 07/31/2020 340  150 - 450 x10E3/uL Final  . Cholesterol, Total 07/31/2020 162  100 - 199 mg/dL Final  . Triglycerides 07/31/2020 131  0 - 149 mg/dL Final  . HDL 07/31/2020 58  >39 mg/dL Final  . VLDL Cholesterol Cal 07/31/2020 23  5 - 40 mg/dL Final  . LDL Chol Calc (NIH) 07/31/2020 81  0 - 99 mg/dL Final  . Chol/HDL Ratio 07/31/2020 2.8  0.0 - 4.4 ratio Final   Comment:                                   T. Chol/HDL Ratio                                             Men  Women                               1/2 Avg.Risk  3.4    3.3                                   Avg.Risk  5.0    4.4                                2X Avg.Risk  9.6    7.1                                3X Avg.Risk 23.4   11.0   . Total Protein 07/31/2020 7.1  6.0 - 8.5 g/dL Final  . Albumin 07/31/2020 4.1  3.8 - 4.9 g/dL Final  . Bilirubin Total 07/31/2020 0.4  0.0 - 1.2 mg/dL Final  . Bilirubin, Direct 07/31/2020 0.11  0.00 - 0.40 mg/dL Final  . Alkaline Phosphatase 07/31/2020 139* 44 - 121 IU/L Final  . AST 07/31/2020 39  0 - 40 IU/L Final  . ALT 07/31/2020 46* 0 - 32 IU/L Final    Allergies as of 08/02/2020      Reactions   Cherry Rash   Lemon Oil Rash      Medication List       Accurate as of August 01, 2020  9:09 PM. If you have any questions, ask your nurse or doctor.        FreeStyle Libre 14 Day Reader Kerrin Mo USE READER TO CHECK BLOOD GLUCOSE WITH FREESTYLE LIBRE SENSORS.   FreeStyle Libre 2 Sensor Misc 2 Devices by Does not apply route every 14 (fourteen) days.   glucose  blood test strip Check sugar 2 times daily.   HumaLOG Mix 75/25 KwikPen (75-25) 100 UNIT/ML Kwikpen Generic drug: Insulin Lispro Prot & Lispro ADMINISTER 24 UNITS  UNDER THE SKIN EVERY DAY BEFORE LUNCH What changed:   how much to take  how to take this  when to take this  additional instructions   insulin lispro 100 UNIT/ML KwikPen Commonly known as: HumaLOG KwikPen Inject 20 units under the skin three times daily before meals. What changed:   how much to take  how to take this  when to take this  reasons to take this  additional instructions   Klor-Con M20 20 MEQ tablet Generic drug: potassium chloride SA TAKE 1 TABLET DAILY   lidocaine-prilocaine cream Commonly known as: EMLA Apply 1 application topically as directed. Apply 1 hour prior to stick and cover with plastic wrap What changed:   when to take this  reasons to take this   lisinopril-hydrochlorothiazide 10-12.5 MG tablet Commonly known as: ZESTORETIC TAKE 1 TABLET DAILY   loratadine 10 MG tablet Commonly known as: CLARITIN Take 10 mg by mouth at bedtime.   magnesium oxide 400 (241.3 Mg) MG tablet Commonly known as: MAG-OX TAKE 1 TABLET(400 MG) BY MOUTH TWICE DAILY What changed: See the new instructions.   metFORMIN 500 MG 24 hr tablet Commonly known as: GLUCOPHAGE-XR TAKE 4 TABLETS ONCE DAILY What changed:   how much to take  how to take this  when to take this  additional instructions   NovoFine Plus Pen Needle 32G X 4 MM Misc Generic drug: Insulin Pen Needle USE TO INJECT INSULIN THREE TIMES DAILY   rosuvastatin 10 MG tablet Commonly known as: CRESTOR Take 10 mg by mouth at bedtime.   TRESIBA FLEXTOUCH [place] Inject into the skin. 10 units daily       Allergies:  Allergies  Allergen Reactions  . Cherry Rash  . Lemon Oil Rash    Past Medical History:  Diagnosis Date  . Anemia   . ASCUS (atypical squamous cells of undetermined significance) on Pap smear 08/06/1999  .  Breast mass in female 2002   Left  . Diabetes mellitus without complication ([place])   . Family history of pancreatic cancer   . Fibroid uterus 2010  . Hypertension   . Irregular bleeding 2011  . 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 PONV (postoperative nausea and vomiting)    nausea  vomitting after 12/25/18 ERCP  . 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 Yeast vaginitis 2006    Past Surgical History:  Procedure Laterality Date  . BILIARY STENT PLACEMENT N/A 12/25/2018   Procedure: BILIARY STENT PLACEMENT;  Surgeon: Carol Ada, MD;  Location: [facility];  Service: Endoscopy;  Laterality: N/A;  . BILIARY STENT PLACEMENT N/A 01/01/2019   Procedure: BILIARY STENT PLACEMENT;  Surgeon: Carol Ada, MD;  Location: [facility];  Service: Endoscopy;  Laterality: N/A;  . DILATATION & CURETTAGE/HYSTEROSCOPY WITH TRUECLEAR N/A 01/20/2014   Procedure: DILATATION & CURETTAGE/HYSTEROSCOPY WITH TRUCLEAR;  Surgeon: [name], MD;  Location: [facility];  Service: Gynecology;  Laterality: N/A;  . DILATATION & CURRETTAGE/HYSTEROSCOPY WITH RESECTOCOPE N/A 01/20/2014   Procedure: [place];  Surgeon: [name], MD;  Location: [facility];  Service: Gynecology;  Laterality: N/A;  . ENDOSCOPIC RETROGRADE CHOLANGIOPANCREATOGRAPHY (ERCP) WITH PROPOFOL N/A 12/25/2018   Procedure: ENDOSCOPIC RETROGRADE CHOLANGIOPANCREATOGRAPHY (ERCP) WITH PROPOFOL;  Surgeon: Carol Ada, MD;  Location: [facility];  Service: Endoscopy;  Laterality: N/A;  . ENDOSCOPIC RETROGRADE CHOLANGIOPANCREATOGRAPHY (ERCP) WITH PROPOFOL N/A 01/01/2019   Procedure: ENDOSCOPIC RETROGRADE CHOLANGIOPANCREATOGRAPHY (ERCP)  WITH PROPOFOL;  Surgeon: Carol Ada, MD;  Location: [facility];  Service: Endoscopy;  Laterality: N/A;  . ERCP  12/25/2018  . FINE NEEDLE ASPIRATION N/A 01/01/2019    Procedure: FINE NEEDLE ASPIRATION (FNA) LINEAR;  Surgeon: Carol Ada, MD;  Location: [facility];  Service: Endoscopy;  Laterality: N/A;  . LAPAROSCOPY N/A 03/04/2019   Procedure: LAPAROSCOPY DIAGNOSTIC;  Surgeon: Stark Klein, MD;  Location: [facility];  Service: General;  Laterality: N/A;  GENERAL AND EPIDURAL  . NO PAST SURGERIES    . PARTIAL NEPHRECTOMY Right 03/04/2019   Procedure: Nephrectomy Partial;  Surgeon: Cleon Gustin, MD;  Location: [facility];  Service: Urology;  Laterality: Right;  . PORTACATH PLACEMENT Left 04/21/2019   Procedure: INSERTION PORT-A-CATH;  Surgeon: Stark Klein, MD;  Location: [facility];  Service: General;  Laterality: Left;  . SPHINCTEROTOMY  12/25/2018   Procedure: SPHINCTEROTOMY;  Surgeon: Carol Ada, MD;  Location: [facility];  Service: Endoscopy;;  . Lavell Islam REMOVAL  01/01/2019   Procedure: STENT REMOVAL;  Surgeon: Carol Ada, MD;  Location: [facility];  Service: Endoscopy;;  . UPPER ESOPHAGEAL ENDOSCOPIC ULTRASOUND (EUS) N/A 01/01/2019   Procedure: UPPER ESOPHAGEAL ENDOSCOPIC ULTRASOUND (EUS);  Surgeon: Carol Ada, MD;  Location: [facility];  Service: Endoscopy;  Laterality: N/A;  . WHIPPLE PROCEDURE N/A 03/04/2019   Procedure: WHIPPLE PROCEDURE;  Surgeon: Stark Klein, MD;  Location: [facility];  Service: General;  Laterality: N/A;  GENERAL AND EPIDURAL    Family History  Problem Relation Age of Onset  . Diabetes Mother   . Dementia Mother   . Hyperlipidemia Mother   . Hypertension Mother   . Irregular heart beat Mother   . Diabetes Father   . Hypertension Father   . Hyperlipidemia Father   . Down syndrome Sister   . Diabetes Sister   . Hyperlipidemia Brother   . Heart Problems Brother   . Goiter Maternal Aunt   . Thyroid nodules Sister   . Cancer Cousin 60       eye; maternal first cousin  . Goiter Cousin   . Cancer Paternal Aunt        unknown form of cancer  . Cancer Cousin        unknown form of cancer; paternal first cousin  .  Pancreatic cancer Cousin 38       paternal first cousin  . Cancer Cousin 44       unknown cancer; paternal first cousin  . Cancer Cousin 41       unknown cancer; paternal first cousin    Social History:  reports that she has never smoked. She has never used smokeless tobacco. She reports current alcohol use. She reports that she does not use drugs.  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    Assessment/Plan:   Diabetes type 2, on insulin  See history of present illness for detailed discussion of her current management, blood sugar patterns and problems identified  Her A1c is usually over 8%    She still has significant postprandial hyperglycemia especially after dinner but periodically after lunch also Also not exercising Ideally she should be managed with insulin pump with closed-loop system Basically she still does not take enough Humalog even though she is seeing significant spikes in her blood sugars at times after meals Since she has the highest readings on an average late at night she may also benefit from using the 75/25 insulin at suppertime which will also help with overnight readings  Recommendations: 2/22  She  will try 5 mg Farxiga for at least a couple of weeks  Discussed action of SGLT 2 drugs on lowering glucose by decreasing kidney absorption of glucose, benefits of weight loss and lower blood pressure, long-term cardiovascular and renal benefits, possible side effects including candidiasis and discussed dosage regimen   She will increase her fluid intake and practice good hygiene to prevent yeast infection  Discussed that blood sugars may not come down right away but will over time need reduction in insulin doses based on sugars  For better blood sugar control late evening she will go to 10 units of Humalog mix instead of Tresiba and explained that this should control her highest readings as well as still give her better readings overnight  She was recommended an insulin pump again but she refuses  Continue Metformin unchanged  She needs to go up to as much as 18 units to cover high carbohydrate meals especially dinnertime  If blood sugars in the afternoon start going up she will increase her morning Humalog mix also  No change in Metformin  Restart regular exercise  Follow-up in  Follow-up with other physicians regarding blood pressure and if she has any increased urination or low blood pressure reading may need to stop HCTZ  There are no Patient Instructions on file for this visit.   Duration of virtual visit =30 minutes   Elayne Snare 08/01/2020, 9:09 PM   Note: This office note was prepared with Dragon voice recognition system technology. Any transcriptional errors that result from this process are unintentional.

## 2020-08-02 ENCOUNTER — Telehealth (INDEPENDENT_AMBULATORY_CARE_PROVIDER_SITE_OTHER): Payer: BC Managed Care – PPO | Admitting: Endocrinology

## 2020-08-02 ENCOUNTER — Encounter: Payer: Self-pay | Admitting: Endocrinology

## 2020-08-02 ENCOUNTER — Other Ambulatory Visit: Payer: Self-pay

## 2020-08-02 ENCOUNTER — Inpatient Hospital Stay: Payer: BC Managed Care – PPO | Admitting: Nurse Practitioner

## 2020-08-02 ENCOUNTER — Encounter: Payer: Self-pay | Admitting: Nurse Practitioner

## 2020-08-02 VITALS — BP 131/86 | Ht 67.0 in | Wt 198.0 lb

## 2020-08-02 VITALS — BP 131/86 | HR 97 | Temp 97.7°F | Resp 18 | Ht 67.0 in | Wt 198.1 lb

## 2020-08-02 DIAGNOSIS — E1165 Type 2 diabetes mellitus with hyperglycemia: Secondary | ICD-10-CM

## 2020-08-02 DIAGNOSIS — E119 Type 2 diabetes mellitus without complications: Secondary | ICD-10-CM | POA: Diagnosis not present

## 2020-08-02 DIAGNOSIS — I10 Essential (primary) hypertension: Secondary | ICD-10-CM | POA: Diagnosis not present

## 2020-08-02 DIAGNOSIS — C250 Malignant neoplasm of head of pancreas: Secondary | ICD-10-CM

## 2020-08-02 DIAGNOSIS — Z794 Long term (current) use of insulin: Secondary | ICD-10-CM | POA: Diagnosis not present

## 2020-08-02 DIAGNOSIS — R918 Other nonspecific abnormal finding of lung field: Secondary | ICD-10-CM | POA: Diagnosis not present

## 2020-08-02 LAB — CANCER ANTIGEN 19-9: CA 19-9: 20 U/mL (ref 0–35)

## 2020-08-02 MED ORDER — DAPAGLIFLOZIN PROPANEDIOL 5 MG PO TABS: 5.0000 mg | ORAL_TABLET | Freq: Every day | ORAL | 3 refills | Status: DC

## 2020-08-02 NOTE — Progress Notes (Signed)
[place] OFFICE PROGRESS NOTE   Diagnosis: Pancreas cancer  INTERVAL HISTORY:   Alyssa Ross returns as scheduled.  She reports a good appetite.  No nausea or vomiting.  No change in bowel habits. No abdominal pain.  She has a good energy level.  No fever, cough, shortness of breath.  Objective:  Vital signs in last 24 hours:  Blood pressure 131/86, pulse 97, temperature 97.7 F (36.5 C), temperature source Tympanic, resp. rate 18, height 5' 7"  (1.702 m), weight 198 lb 1.6 oz (89.9 kg), last menstrual period 09/17/2012, SpO2 98 %.    HEENT: No thrush or ulcers. Lymphatics: No palpable cervical or supraclavicular lymph nodes. Resp: Lungs clear bilaterally. Cardio: Regular rate and rhythm. GI: Abdomen soft and nontender.  No hepatomegaly.  No mass. Vascular: No leg edema.  Port-A-Cath without erythema.  Lab Results:  Lab Results  Component Value Date   WBC 4.6 07/31/2020   HGB 11.8 07/31/2020   HCT 36.8 07/31/2020   MCV 95 07/31/2020   PLT 340 07/31/2020   NEUTROABS 2.4 05/26/2020    Imaging:  CT CHEST W CONTRAST  Result Date: 08/02/2020 CLINICAL DATA:  Restaging pancreatic cancer. Follow-up pulmonary nodules. EXAM: CT CHEST WITH CONTRAST TECHNIQUE: Multidetector CT imaging of the chest was performed during intravenous contrast administration. CONTRAST:  15m OMNIPAQUE IOHEXOL 300 MG/ML  SOLN COMPARISON:  Chest CT 04/26/2020 and 02/07/2020. Abdominal CT 05/26/2020 FINDINGS: Cardiovascular: No acute vascular findings. Left subclavian Port-A-Cath tip extends to the lower SVC. Mild aortic and coronary artery atherosclerosis. The heart size is normal. There is no pericardial effusion. Mediastinum/Nodes: There are no enlarged mediastinal, hilar or axillary lymph nodes.Stable residual thymic tissue in the anterior mediastinum and small hiatal hernia. The thyroid gland and trachea demonstrate no significant findings. Lungs/Pleura: No pleural effusion or pneumothorax.  Multiple pulmonary nodules are again noted in both lungs. Many of these are cavitary and enlarging compared with the prior study from 3 months ago. For example, an inferior cavitary right upper lobe nodule measuring 1.6 x 1.2 cm on image 54/7 previously measured 1.4 x 1.0 cm. A solid left lower lobe nodule measuring 1.5 x 1.3 cm on image 50/7 previously measured 1.2 x 1.1 cm. Interval cavitation of the right lower lobe nodule measuring 0.9 cm on image 74/7 (previously 7 mm). Some new nodules are present. Upper abdomen: There are stable postsurgical changes in the upper abdomen with pneumobilia and a pancreatic stent. Musculoskeletal/Chest wall: There is no chest wall mass or suspicious osseous finding. IMPRESSION: 1. Interval enlargement and progressive cavitation of multiple bilateral pulmonary nodules, consistent with progressive metastatic disease. 2. No adenopathy or pleural effusion. 3. Aortic Atherosclerosis (ICD10-I70.0). Electronically Signed   By: WRichardean SaleM.D.   On: 08/02/2020 10:05    Medications: I have reviewed the patient's current medications.  Assessment/Plan: 1. 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 Tumor invades the duodenal wall and vascular groove, resection margins negative, 4/34 lymph nodes positive ? MSI-stable, tumor showed instability in 2 loci as did adjacent normal tissue ? 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 CTs 01/29/2019-ill-defined pancreas head mass, 5 pulmonary nodules-1 with a small amount of central cavitation, tumor abuts the left margin of the portal vein indistinct density surrounding, hepatic artery, complex cystic lesion of the right kidney, right adrenal mass-characterized as an adenoma on a [facility] MRI 12/21/2018 ? Netspot 03/03/2019-no focal pancreas activity, no tracer accumulation within the suspicious pulmonary nodules, left uterine  mass with tracer  accumulation felt to represent a leiomyoma ? Elevated preoperative CA 19-9--CA 19-9 186 on 01/18/2019 ? CT chest 04/16/2019-multiple bilateral pulmonary nodules, some with increased cavitation, stable in size ? Cycle 1 FOLFIRINOX 04/27/2019 ? Cycle 2 FOLFIRINOX 05/11/2019 ? Cycle 3 FOLFIRINOX 05/23/2019 ? Cycle 4 FOLFIRINOX 06/08/2019 ? Cycle 5 FOLFIRINOX 06/22/2019 ? CT chest 07/02/2019-stable size of bilateral pulmonary nodules. Dominant cavitary lesions in both lungs show increased cavitation with thinner walls. Stable 2.1 cm right adrenal nodule. ? Cycle 6 FOLFIRINOX 07/06/2019 ? Cycle 7 FOLFIRINOX 07/21/2019 ? Cycle 8 FOLFIRINOX 08/03/2019, oxaliplatin deleted secondary to neuropathy ? CT chest 08/24/2019-decreased size of several lung nodules with resolution of a left upper lobe nodule, no new nodules ? Radiationto the pancreas surgical area with concurrent Xeloda 09/13/2019-10/20/2019 ? CTs 11/29/2019-multiple small pulmonary nodules scattered throughout the lungs bilaterally, appear increased in number and size. No definite evidence of metastatic disease in the abdomen or pelvis. Markedly enlarged and heterogeneous appearing uterus, likely to represent multifocal fibroids. 1 of these lesions appears to be an exophytic subserosal fibroid in the posterior lateral aspect of the uterine body on the left side although this comes in close proximity to the left adnexa such that a primary ovarian lesion is difficult to completely exclude. ? CTs 02/07/2020-slight enlargement of bilateral lung nodules, some are cavitary, no evidence of metastatic disease in the abdomen or pelvis, stable right adrenal nodule, uterine fibroids ? CTs 04/26/2020-mild enlargement of pulmonary nodules, slight increase in trace pelvic fluid, new soft tissue thickening inferior to the cecal tip suspicious for peritoneal metastasis ? CT 05/26/2020-improved appearance of soft tissue at the inferior tip of the cecum, mildly thickened  short appendix-findings suggestive of resolving appendicitis, stable small bibasilar pulmonary nodules, fibroids ? Plan biopsy of right cecal tip soft tissue canceled secondary to radiologic improvement ? CT chest 08/02/2020-enlargement and progressive cavitation of multiple bilateral lung nodules.  Some new nodules are present.  2. 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 Diabetes 4. Hypertension 5. Family history of pancreas cancer, INVITAE panel-VUS in the TERT 6. Port-A-Cath placement, Dr. 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 Mild lower abdominal pain after exercise, likely MSK related (04/04/20)   Disposition: Alyssa Ross appears well.  The chest CT shows enlargement of the bilateral lung nodules.  Dr. Benay Spice reviewed the report/images with Alyssa Ross at today's visit.  She remains asymptomatic.  There was discussion regarding continued observation versus systemic therapy with gemcitabine/Abraxane.  She is comfortable with continued observation.  Plan for restaging chest CT in 3 months.  She will return for a Port-A-Cath flush in 6 weeks.  We will see her in 3 months, a few days after the CT scan.  Patient seen with Dr. Benay Spice.    Ned Card ANP/GNP-BC   08/02/2020  12:20 PM  This was a shared visit with Ned Card.  I reviewed the CT images and discussed treatment options with Alyssa Ross.  She understands the pulmonary nodules very likely indicate metastatic pancreas cancer.  No therapy will be curative.  We discussed gemcitabine/Abraxane chemotherapy.  She is comfortable with continued observation.  I was present for greater than 50% of today's visit.  I performed medical decision making.  Julieanne Manson, MD

## 2020-09-04 ENCOUNTER — Other Ambulatory Visit: Payer: Self-pay | Admitting: *Deleted

## 2020-09-04 DIAGNOSIS — Z794 Long term (current) use of insulin: Secondary | ICD-10-CM

## 2020-09-04 DIAGNOSIS — E1165 Type 2 diabetes mellitus with hyperglycemia: Secondary | ICD-10-CM

## 2020-09-04 MED ORDER — NOVOFINE PLUS PEN NEEDLE 32G X 4 MM MISC: 1 refills | Status: DC

## 2020-09-07 ENCOUNTER — Other Ambulatory Visit: Payer: Self-pay | Admitting: *Deleted

## 2020-09-07 ENCOUNTER — Other Ambulatory Visit: Payer: Self-pay

## 2020-09-07 ENCOUNTER — Encounter: Payer: Self-pay | Admitting: Internal Medicine

## 2020-09-07 DIAGNOSIS — Z794 Long term (current) use of insulin: Secondary | ICD-10-CM

## 2020-09-07 DIAGNOSIS — E1165 Type 2 diabetes mellitus with hyperglycemia: Secondary | ICD-10-CM

## 2020-09-07 MED ORDER — FREESTYLE LIBRE 2 SENSOR MISC: 2.0000 | 3 refills | Status: DC

## 2020-09-07 MED ORDER — ROSUVASTATIN CALCIUM 10 MG PO TABS: 10.0000 mg | ORAL_TABLET | Freq: Every day | ORAL | 1 refills | Status: DC

## 2020-09-11 ENCOUNTER — Other Ambulatory Visit: Payer: Self-pay | Admitting: *Deleted

## 2020-09-11 ENCOUNTER — Telehealth: Payer: Self-pay | Admitting: Oncology

## 2020-09-11 DIAGNOSIS — E1165 Type 2 diabetes mellitus with hyperglycemia: Secondary | ICD-10-CM

## 2020-09-11 MED ORDER — FREESTYLE LIBRE 2 SENSOR MISC: 2.0000 | 3 refills | Status: DC

## 2020-09-11 NOTE — Telephone Encounter (Signed)
Rescheduled per pt request, 4/4 sch msg. Called and spoke with pt confirmed 4/6 appt

## 2020-09-13 ENCOUNTER — Inpatient Hospital Stay: Payer: BC Managed Care – PPO | Attending: Genetic Counselor

## 2020-09-13 ENCOUNTER — Other Ambulatory Visit: Payer: Self-pay

## 2020-09-13 ENCOUNTER — Inpatient Hospital Stay: Payer: BC Managed Care – PPO

## 2020-09-13 DIAGNOSIS — C250 Malignant neoplasm of head of pancreas: Secondary | ICD-10-CM | POA: Diagnosis not present

## 2020-09-13 DIAGNOSIS — Z95828 Presence of other vascular implants and grafts: Secondary | ICD-10-CM

## 2020-09-13 DIAGNOSIS — Z452 Encounter for adjustment and management of vascular access device: Secondary | ICD-10-CM | POA: Diagnosis not present

## 2020-09-13 MED ORDER — SODIUM CHLORIDE 0.9% FLUSH
10.0000 mL | Freq: Once | INTRAVENOUS | Status: AC
  Administered 2020-09-13: 10 mL
  Filled 2020-09-13: qty 10

## 2020-09-13 MED ORDER — HEPARIN SOD (PORK) LOCK FLUSH 100 UNIT/ML IV SOLN
500.0000 [IU] | Freq: Once | INTRAVENOUS | Status: AC
  Administered 2020-09-13: 500 [IU]
  Filled 2020-09-13: qty 5

## 2020-09-13 NOTE — Patient Instructions (Signed)
Implanted Port Insertion, Care After This sheet gives you information about how to care for yourself after your procedure. Your health care provider may also give you more specific instructions. If you have problems or questions, contact your health care provider. What can I expect after the procedure? After the procedure, it is common to have:  Discomfort at the port insertion site.  Bruising on the skin over the port. This should improve over 3-4 days. Follow these instructions at home: Port care  After your port is placed, you will get a manufacturer's information card. The card has information about your port. Keep this card with you at all times.  Take care of the port as told by your health care provider. Ask your health care provider if you or a family member can get training for taking care of the port at home. A home health care nurse may also take care of the port.  Make sure to remember what type of port you have. Incision care  Follow instructions from your health care provider about how to take care of your port insertion site. Make sure you: ? Wash your hands with soap and water before and after you change your bandage (dressing). If soap and water are not available, use hand sanitizer. ? Change your dressing as told by your health care provider. ? Leave stitches (sutures), skin glue, or adhesive strips in place. These skin closures may need to stay in place for 2 weeks or longer. If adhesive strip edges start to loosen and curl up, you may trim the loose edges. Do not remove adhesive strips completely unless your health care provider tells you to do that.  Check your port insertion site every day for signs of infection. Check for: ? Redness, swelling, or pain. ? Fluid or blood. ? Warmth. ? Pus or a bad smell.      Activity  Return to your normal activities as told by your health care provider. Ask your health care provider what activities are safe for you.  Do not  lift anything that is heavier than 10 lb (4.5 kg), or the limit that you are told, until your health care provider says that it is safe. General instructions  Take over-the-counter and prescription medicines only as told by your health care provider.  Do not take baths, swim, or use a hot tub until your health care provider approves. Ask your health care provider if you may take showers. You may only be allowed to take sponge baths.  Do not drive for 24 hours if you were given a sedative during your procedure.  Wear a medical alert bracelet in case of an emergency. This will tell any health care providers that you have a port.  Keep all follow-up visits as told by your health care provider. This is important. Contact a health care provider if:  You cannot flush your port with saline as directed, or you cannot draw blood from the port.  You have a fever or chills.  You have redness, swelling, or pain around your port insertion site.  You have fluid or blood coming from your port insertion site.  Your port insertion site feels warm to the touch.  You have pus or a bad smell coming from the port insertion site. Get help right away if:  You have chest pain or shortness of breath.  You have bleeding from your port that you cannot control. Summary  Take care of the port as told by your   health care provider. Keep the manufacturer's information card with you at all times.  Change your dressing as told by your health care provider.  Contact a health care provider if you have a fever or chills or if you have redness, swelling, or pain around your port insertion site.  Keep all follow-up visits as told by your health care provider. This information is not intended to replace advice given to you by your health care provider. Make sure you discuss any questions you have with your health care provider. Document Revised: 12/23/2017 Document Reviewed: 12/23/2017 Elsevier Patient Education   2021 [employer]  

## 2020-09-19 IMAGING — RF ERCP
1 series · 5 of 5 positions shown · non-contrast
Comparison: None.

CLINICAL DATA: Pancreatic mass and biliary obstruction.

EXAM:
ERCP
TECHNIQUE: Multiple spot images obtained with the fluoroscopic device and
submitted for interpretation post-procedure.

[Series 1: run · 5 of 5 slices shown]
[im 1/5]
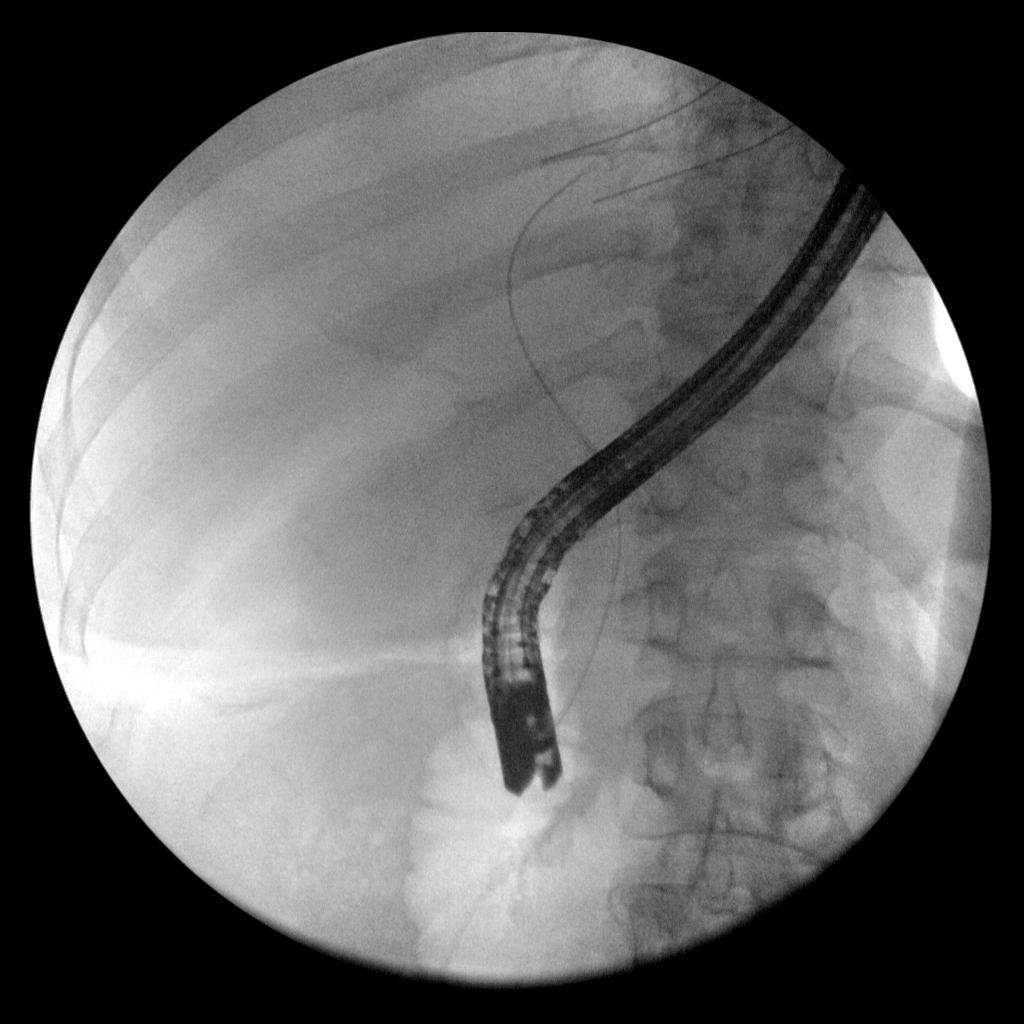
[im 2/5]
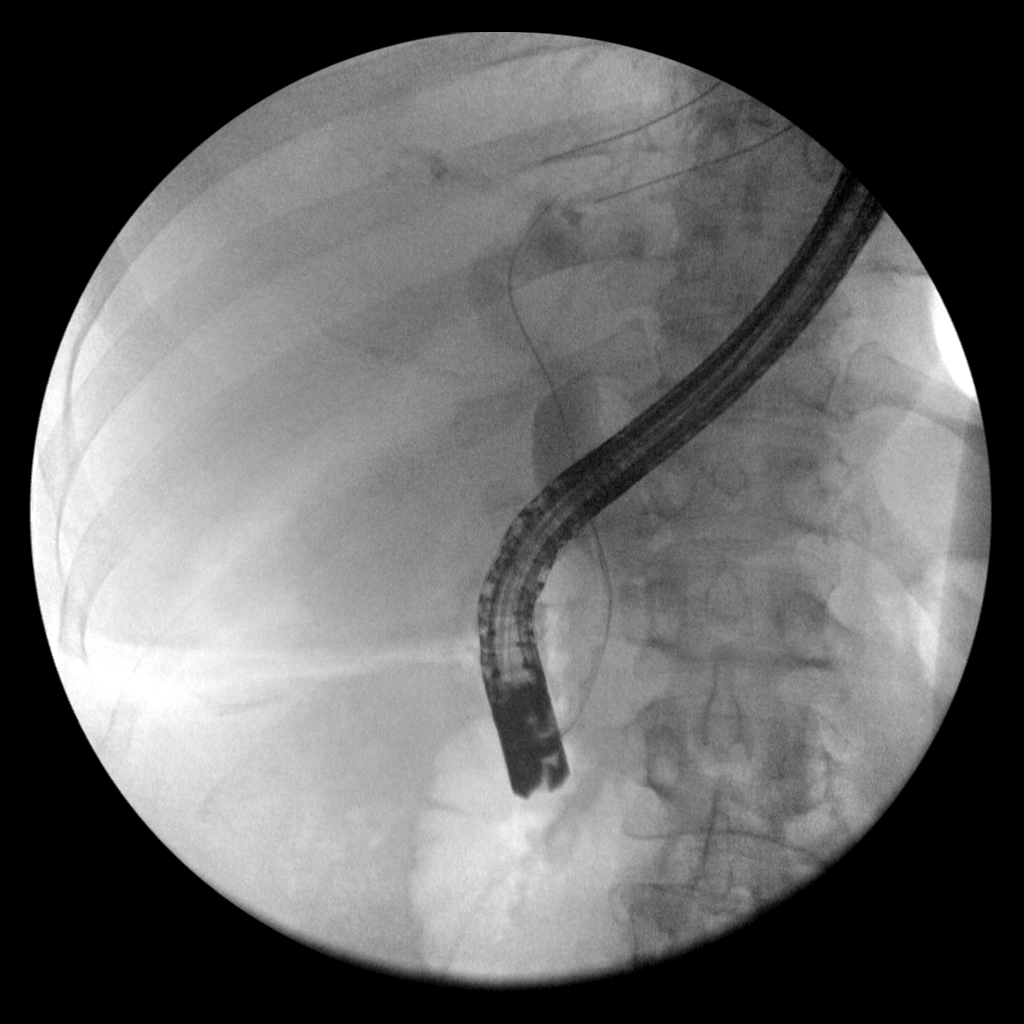
[im 3/5]
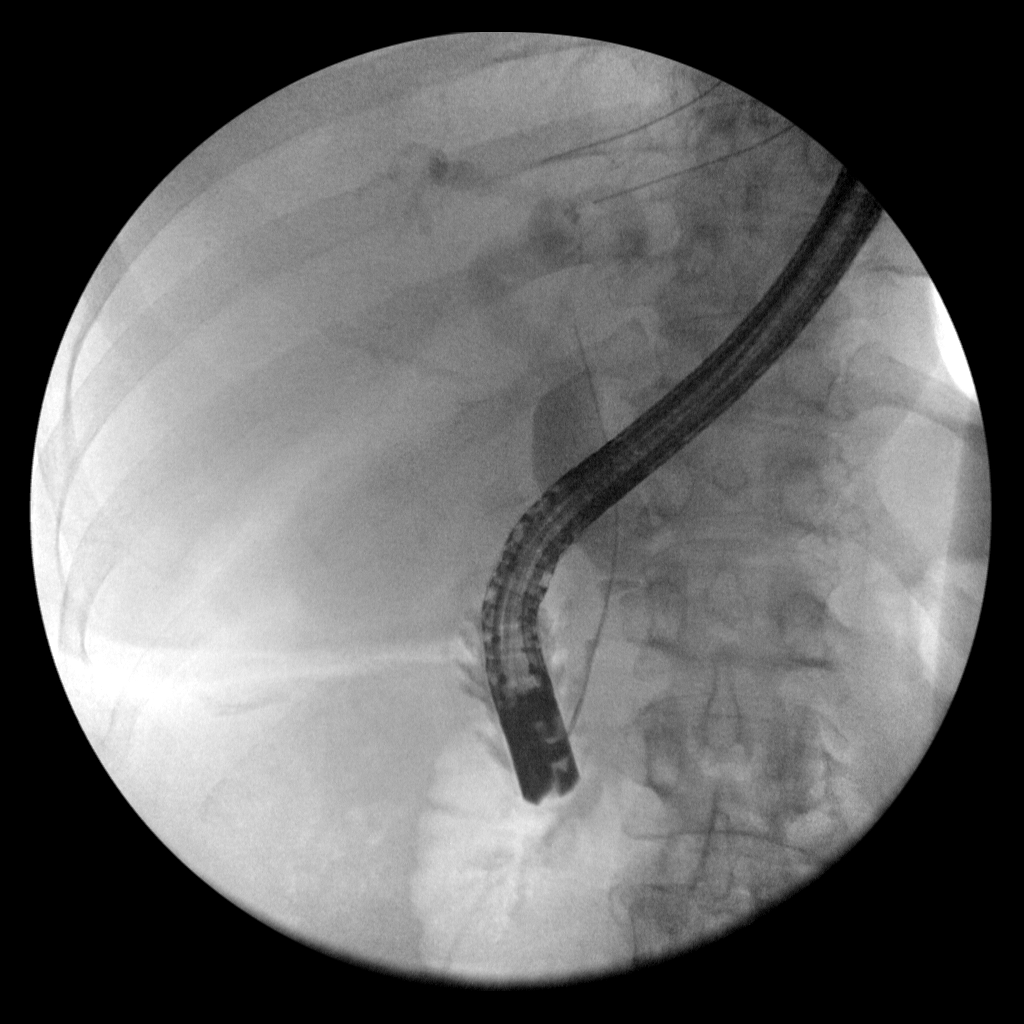
[im 4/5]
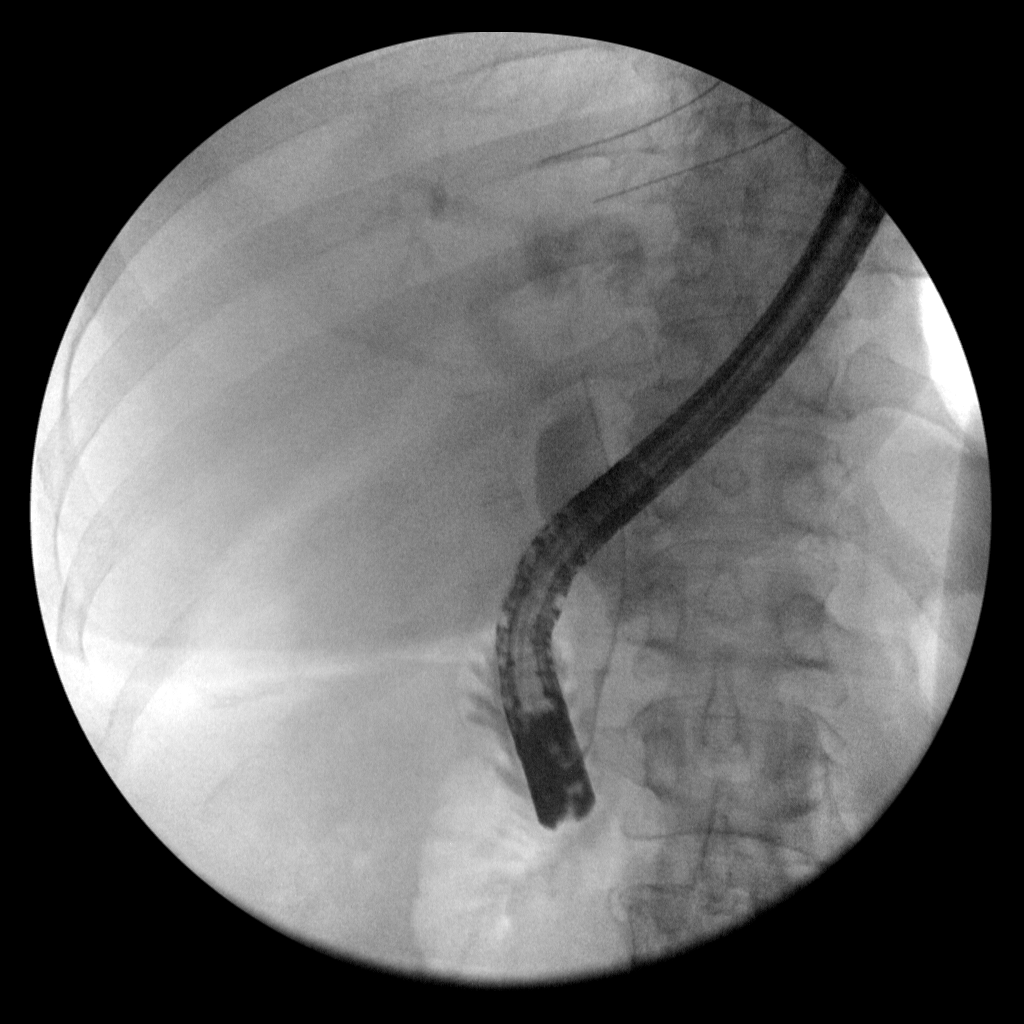
[im 5/5]
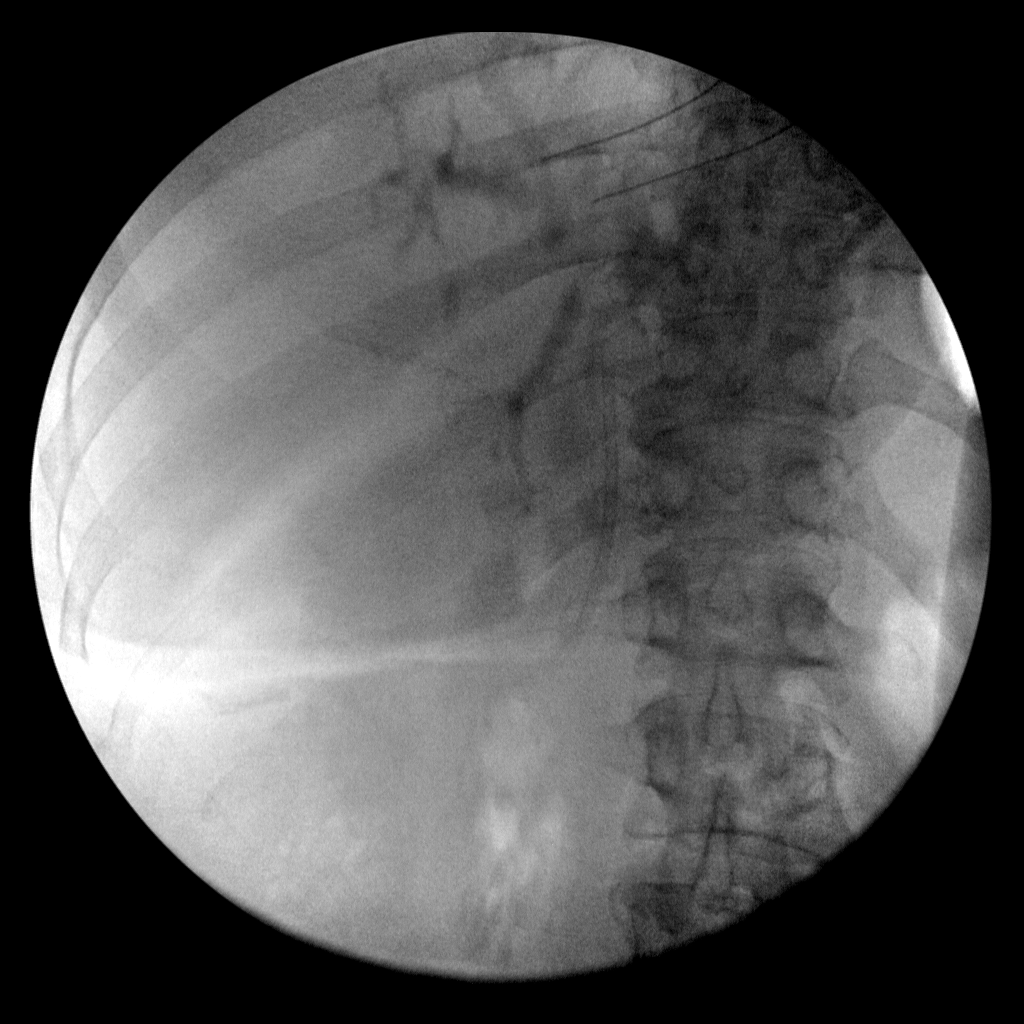

[5 of 5 positions shown; findings below may reference images not displayed]

FINDINGS: Imaging during ERCP was performed with a C-arm. Submitted imaging
demonstrates cannulation of the common bile duct with contrast
injection demonstrating dilatation of the proximal to mid common
bile duct with evidence of distal stricture. An endoscopic biliary
stent was placed traversing the stricture and extending into the
duodenum.
IMPRESSION: Obstructive stricture of the distal common bile duct. Endoscopic
stenting was performed of the common bile duct.

These images were submitted for radiologic interpretation only.
Please see the procedural report for the amount of contrast and the
fluoroscopy time utilized.

## 2020-09-25 ENCOUNTER — Other Ambulatory Visit: Payer: Self-pay | Admitting: Endocrinology

## 2020-09-25 DIAGNOSIS — E1165 Type 2 diabetes mellitus with hyperglycemia: Secondary | ICD-10-CM

## 2020-09-26 DIAGNOSIS — D3001 Benign neoplasm of right kidney: Secondary | ICD-10-CM | POA: Diagnosis not present

## 2020-09-27 ENCOUNTER — Other Ambulatory Visit: Payer: BC Managed Care – PPO

## 2020-10-02 ENCOUNTER — Ambulatory Visit: Payer: BC Managed Care – PPO | Admitting: Endocrinology

## 2020-10-02 ENCOUNTER — Telehealth: Payer: Self-pay | Admitting: *Deleted

## 2020-10-02 NOTE — Telephone Encounter (Signed)
Has CT chest ordered and scheduled for 10/24/20. Patient thinks she needs a CT abdomen as well. Informed her MD will be made aware of her concern and request.

## 2020-10-02 NOTE — Telephone Encounter (Signed)
Per Dr. Benay Spice: Only needs CT chest. Did not see need for abdomen. OK to add abdomen if she is concerned and would like this. Left VM w/patient with MD response and request to return call with her decision.

## 2020-10-03 ENCOUNTER — Encounter: Payer: Self-pay | Admitting: *Deleted

## 2020-10-03 ENCOUNTER — Other Ambulatory Visit: Payer: Self-pay | Admitting: *Deleted

## 2020-10-03 DIAGNOSIS — C250 Malignant neoplasm of head of pancreas: Secondary | ICD-10-CM

## 2020-10-03 NOTE — Progress Notes (Signed)
Patient insists on have CT scan abdomen as well. OK per Dr. Benay Spice. Was able to link to her chest. NPO 4 hours prior and will need oral contrast at 0800 and 0900. Left information on her voice mail and sent via [place].

## 2020-10-06 ENCOUNTER — Other Ambulatory Visit (HOSPITAL_COMMUNITY): Payer: Self-pay | Admitting: Interventional Radiology

## 2020-10-19 ENCOUNTER — Encounter: Payer: Self-pay | Admitting: Internal Medicine

## 2020-10-24 ENCOUNTER — Other Ambulatory Visit: Payer: BC Managed Care – PPO

## 2020-10-24 ENCOUNTER — Ambulatory Visit (HOSPITAL_BASED_OUTPATIENT_CLINIC_OR_DEPARTMENT_OTHER)
Admission: RE | Admit: 2020-10-24 | Discharge: 2020-10-24 | Disposition: A | Discharge status: Home / Self Care | Payer: BC Managed Care – PPO | Source: Ambulatory Visit | Attending: Nurse Practitioner | Admitting: Nurse Practitioner

## 2020-10-24 ENCOUNTER — Inpatient Hospital Stay: Payer: BC Managed Care – PPO | Attending: Genetic Counselor

## 2020-10-24 ENCOUNTER — Inpatient Hospital Stay: Payer: BC Managed Care – PPO

## 2020-10-24 ENCOUNTER — Other Ambulatory Visit: Payer: Self-pay

## 2020-10-24 DIAGNOSIS — C7800 Secondary malignant neoplasm of unspecified lung: Secondary | ICD-10-CM | POA: Insufficient documentation

## 2020-10-24 DIAGNOSIS — C250 Malignant neoplasm of head of pancreas: Secondary | ICD-10-CM

## 2020-10-24 DIAGNOSIS — I10 Essential (primary) hypertension: Secondary | ICD-10-CM | POA: Insufficient documentation

## 2020-10-24 DIAGNOSIS — Z452 Encounter for adjustment and management of vascular access device: Secondary | ICD-10-CM | POA: Diagnosis not present

## 2020-10-24 DIAGNOSIS — N6321 Unspecified lump in the left breast, upper outer quadrant: Secondary | ICD-10-CM | POA: Diagnosis not present

## 2020-10-24 DIAGNOSIS — Z95828 Presence of other vascular implants and grafts: Secondary | ICD-10-CM

## 2020-10-24 DIAGNOSIS — R911 Solitary pulmonary nodule: Secondary | ICD-10-CM | POA: Diagnosis not present

## 2020-10-24 DIAGNOSIS — E119 Type 2 diabetes mellitus without complications: Secondary | ICD-10-CM | POA: Insufficient documentation

## 2020-10-24 DIAGNOSIS — E279 Disorder of adrenal gland, unspecified: Secondary | ICD-10-CM | POA: Diagnosis not present

## 2020-10-24 LAB — CMP (CANCER CENTER ONLY)
ALT: 40 U/L (ref 0–44)
AST: 37 U/L (ref 15–41)
Albumin: 3.8 g/dL (ref 3.5–5.0)
Alkaline Phosphatase: 104 U/L (ref 38–126)
Anion gap: 9 (ref 5–15)
BUN: 9 mg/dL (ref 6–20)
CO2: 28 mmol/L (ref 22–32)
Calcium: 8.7 mg/dL — ABNORMAL LOW (ref 8.9–10.3)
Chloride: 105 mmol/L (ref 98–111)
Creatinine: 1.2 mg/dL — ABNORMAL HIGH (ref 0.44–1.00)
GFR, Estimated: 54 mL/min — ABNORMAL LOW (ref >60)
Glucose, Bld: 147 mg/dL — ABNORMAL HIGH (ref 70–99)
Potassium: 3.3 mmol/L — ABNORMAL LOW (ref 3.5–5.1)
Sodium: 142 mmol/L (ref 135–145)
Total Bilirubin: 0.6 mg/dL (ref 0.3–1.2)
Total Protein: 6.6 g/dL (ref 6.5–8.1)

## 2020-10-24 LAB — MAGNESIUM: Magnesium: 1.7 mg/dL (ref 1.7–2.4)

## 2020-10-24 MED ORDER — SODIUM CHLORIDE 0.9% FLUSH
10.0000 mL | Freq: Once | INTRAVENOUS | Status: AC
  Administered 2020-10-24: 10 mL
  Filled 2020-10-24: qty 10

## 2020-10-24 MED ORDER — IOHEXOL 300 MG/ML  SOLN
100.0000 mL | Freq: Once | INTRAMUSCULAR | Status: AC | PRN
  Administered 2020-10-24: 80 mL via INTRAVENOUS

## 2020-10-24 MED ORDER — HEPARIN SOD (PORK) LOCK FLUSH 100 UNIT/ML IV SOLN
500.0000 [IU] | Freq: Once | INTRAVENOUS | Status: AC
Start: 2020-10-24 — End: 2020-10-24
  Administered 2020-10-24: 500 [IU]
  Filled 2020-10-24: qty 5

## 2020-10-24 NOTE — Patient Instructions (Signed)

## 2020-10-25 LAB — CANCER ANTIGEN 19-9: CA 19-9: 22 U/mL (ref 0–35)

## 2020-10-26 ENCOUNTER — Other Ambulatory Visit: Payer: Self-pay | Admitting: Nurse Practitioner

## 2020-10-26 ENCOUNTER — Inpatient Hospital Stay: Payer: BC Managed Care – PPO | Admitting: Oncology

## 2020-10-26 ENCOUNTER — Ambulatory Visit: Payer: BC Managed Care – PPO | Admitting: Oncology

## 2020-10-26 ENCOUNTER — Other Ambulatory Visit: Payer: Self-pay

## 2020-10-26 VITALS — BP 147/87 | HR 83 | Temp 97.7°F | Resp 18 | Ht 67.0 in | Wt 205.2 lb

## 2020-10-26 DIAGNOSIS — C7800 Secondary malignant neoplasm of unspecified lung: Secondary | ICD-10-CM | POA: Diagnosis not present

## 2020-10-26 DIAGNOSIS — C250 Malignant neoplasm of head of pancreas: Secondary | ICD-10-CM | POA: Diagnosis not present

## 2020-10-26 DIAGNOSIS — E119 Type 2 diabetes mellitus without complications: Secondary | ICD-10-CM | POA: Diagnosis not present

## 2020-10-26 DIAGNOSIS — N6321 Unspecified lump in the left breast, upper outer quadrant: Secondary | ICD-10-CM | POA: Diagnosis not present

## 2020-10-26 DIAGNOSIS — Z452 Encounter for adjustment and management of vascular access device: Secondary | ICD-10-CM | POA: Diagnosis not present

## 2020-10-26 DIAGNOSIS — I10 Essential (primary) hypertension: Secondary | ICD-10-CM | POA: Diagnosis not present

## 2020-10-26 MED ORDER — LIDOCAINE-PRILOCAINE 2.5-2.5 % EX CREA: 1.0000 "application " | TOPICAL_CREAM | CUTANEOUS | 5 refills | Status: DC

## 2020-10-26 NOTE — Progress Notes (Signed)
[facility] OFFICE PROGRESS NOTE   Diagnosis: Pancreas cancer  INTERVAL HISTORY:   [name] returns as scheduled.  She feels well.  Her blood sugar is running high.  No shortness of breath or cough.  She noted a mass in the upper left breast after receiving a COVID-19 vaccine.  An ultrasound on 07/04/2020 revealed a 5 mm mass at the 1 o'clock position of the left breast.  A biopsy revealed fibroadenomatoid change.  The mass has persisted.  Objective:  Vital signs in last 24 hours:  Blood pressure (!) 147/87, pulse 83, temperature 97.7 F (36.5 C), temperature source Oral, resp. rate 18, height _0  (1.702 m), weight 205 lb 3.2 oz (93.1 kg), last menstrual period 09/17/2012, SpO2 100 %.    Lymphatics: No cervical, supraclavicular, or axillary nodes Resp: Lungs clear bilaterally Cardio: Regular rate and rhythm GI: No hepatosplenomegaly, no mass, nontender Vascular: No leg edema Breast: At the 1 o'clock position of the left breast just outside of the areola there is generalized firmness with a 1 cm superficial mass  Portacath/PICC-without erythema  Lab Results:  Lab Results  Component Value Date   WBC 4.6 07/31/2020   HGB 11.8 07/31/2020   HCT 36.8 07/31/2020   MCV 95 07/31/2020   PLT 340 07/31/2020   NEUTROABS 2.4 05/26/2020    CMP  Lab Results  Component Value Date   NA 142 10/24/2020   K 3.3 (L) 10/24/2020   CL 105 10/24/2020   CO2 28 10/24/2020   GLUCOSE 147 (H) 10/24/2020   BUN 9 10/24/2020   CREATININE 1.20 (H) 10/24/2020   CALCIUM 8.7 (L) 10/24/2020   PROT 6.6 10/24/2020   ALBUMIN 3.8 10/24/2020   AST 37 10/24/2020   ALT 40 10/24/2020   ALKPHOS 104 10/24/2020   BILITOT 0.6 10/24/2020   GFRNONAA 54 (L) 10/24/2020   GFRAA >60 02/07/2020     Imaging:  CT Chest W Contrast  Result Date: 10/25/2020 CLINICAL DATA:  55 year old female with history of adenocarcinoma of the pancreas. Follow-up study. EXAM: CT CHEST AND ABDOMEN WITH CONTRAST  TECHNIQUE: Multidetector CT imaging of the chest and abdomen was performed following the standard protocol during bolus administration of intravenous contrast. CONTRAST:  64m OMNIPAQUE IOHEXOL 300 MG/ML  SOLN COMPARISON:  Chest CT 08/01/2020. CT the abdomen and pelvis 05/26/2020. FINDINGS: CT CHEST FINDINGS Cardiovascular: Heart size is normal. There is no significant pericardial fluid, thickening or pericardial calcification. Aortic atherosclerosis. No definite coronary artery calcifications. Left-sided subclavian single-lumen porta cath with tip terminating at the superior cavoatrial junction. Mediastinum/Nodes: No pathologically enlarged mediastinal or hilar lymph nodes. Esophagus is unremarkable in appearance. No axillary lymphadenopathy. Lungs/Pleura: Previously noted thick-walled cavitary right upper lobe nodule (axial image 56 of series 3) has increased in size, currently measuring 2.0 x 1.3 cm (previously 1.6 x 1.2 cm on 08/01/2020). Previously noted left lower lobe pulmonary nodule (axial image 52 of series 3) has also increased in size, currently measuring 1.9 x 1.5 cm (previously 1.5 x 1.3 cm) and now demonstrates cavitation along the superior aspect of the lesion. Multiple other smaller pulmonary nodules are noted elsewhere throughout the lungs bilaterally generally slightly increased in size compared to the prior examination. No definite new pulmonary nodules are confidently noted. No acute consolidative airspace disease. No pleural effusions. Musculoskeletal: There are no aggressive appearing lytic or blastic lesions noted in the visualized portions of the skeleton. CT ABDOMEN FINDINGS Hepatobiliary: No suspicious cystic or solid hepatic lesions. No intra or extrahepatic  biliary ductal dilatation. Small amount of pneumobilia, presumably from prior sphincterotomy. Gallbladder surgically absent. Pancreas: Postoperative changes of prior Whipple procedure are again noted. A stent is noted in the main  pancreatic duct. No definite residual/recurrent pancreatic mass confidently identified. No peripancreatic fluid collections or inflammatory changes. Spleen: Unremarkable. Adrenals/Urinary Tract: Scarring in the anterior aspect of the interpolar region of the right kidney, similar to prior studies. Left kidney and left adrenal gland are normal in appearance. 2.6 x 1.6 cm right adrenal nodule, stable compared to prior examinations, favored to represent a benign lesions such as an adenoma, but incompletely characterized on today's examination. No hydroureteronephrosis in the visualized portions of the abdomen. Stomach/Bowel: Postoperative changes of Whipple procedure. No pathologic dilatation of visualized portions of the small bowel or colon. Vascular/Lymphatic: No aneurysm identified in the visualized abdominal vasculature. No lymphadenopathy noted in the abdomen. Other: No significant volume of ascites noted in the visualized portions of the peritoneal cavity. Musculoskeletal: No aggressive appearing osseous lesions are noted in the visualized portions of the skeleton. IMPRESSION: 1. 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 2. No signs of recurrent or metastatic disease in the abdomen. 3. Stable right adrenal nodule, favored to represent a benign lesions such as an adenoma. 4. Aortic atherosclerosis. 5. Additional incidental findings, as above. Electronically Signed   By: Vinnie Langton M.D.   On: 10/25/2020 10:41   CT Abdomen W Contrast  Result Date: 10/25/2020 CLINICAL DATA:  55 year old female with history of adenocarcinoma of the pancreas. Follow-up study. EXAM: CT CHEST AND ABDOMEN WITH CONTRAST TECHNIQUE: Multidetector CT imaging of the chest and abdomen was performed following the standard protocol during bolus administration of intravenous contrast. CONTRAST:  73m OMNIPAQUE IOHEXOL 300 MG/ML  SOLN COMPARISON:  Chest CT 08/01/2020. CT the abdomen and pelvis 05/26/2020. FINDINGS: CT CHEST  FINDINGS Cardiovascular: Heart size is normal. There is no significant pericardial fluid, thickening or pericardial calcification. Aortic atherosclerosis. No definite coronary artery calcifications. Left-sided subclavian single-lumen porta cath with tip terminating at the superior cavoatrial junction. Mediastinum/Nodes: No pathologically enlarged mediastinal or hilar lymph nodes. Esophagus is unremarkable in appearance. No axillary lymphadenopathy. Lungs/Pleura: Previously noted thick-walled cavitary right upper lobe nodule (axial image 56 of series 3) has increased in size, currently measuring 2.0 x 1.3 cm (previously 1.6 x 1.2 cm on 08/01/2020). Previously noted left lower lobe pulmonary nodule (axial image 52 of series 3) has also increased in size, currently measuring 1.9 x 1.5 cm (previously 1.5 x 1.3 cm) and now demonstrates cavitation along the superior aspect of the lesion. Multiple other smaller pulmonary nodules are noted elsewhere throughout the lungs bilaterally generally slightly increased in size compared to the prior examination. No definite new pulmonary nodules are confidently noted. No acute consolidative airspace disease. No pleural effusions. Musculoskeletal: There are no aggressive appearing lytic or blastic lesions noted in the visualized portions of the skeleton. CT ABDOMEN FINDINGS Hepatobiliary: No suspicious cystic or solid hepatic lesions. No intra or extrahepatic biliary ductal dilatation. Small amount of pneumobilia, presumably from prior sphincterotomy. Gallbladder surgically absent. Pancreas: Postoperative changes of prior Whipple procedure are again noted. A stent is noted in the main pancreatic duct. No definite residual/recurrent pancreatic mass confidently identified. No peripancreatic fluid collections or inflammatory changes. Spleen: Unremarkable. Adrenals/Urinary Tract: Scarring in the anterior aspect of the interpolar region of the right kidney, similar to prior studies. Left  kidney and left adrenal gland are normal in appearance. 2.6 x 1.6 cm right adrenal nodule, stable compared to prior  examinations, favored to represent a benign lesions such as an adenoma, but incompletely characterized on today's examination. No hydroureteronephrosis in the visualized portions of the abdomen. Stomach/Bowel: Postoperative changes of Whipple procedure. No pathologic dilatation of visualized portions of the small bowel or colon. Vascular/Lymphatic: No aneurysm identified in the visualized abdominal vasculature. No lymphadenopathy noted in the abdomen. Other: No significant volume of ascites noted in the visualized portions of the peritoneal cavity. Musculoskeletal: No aggressive appearing osseous lesions are noted in the visualized portions of the skeleton. IMPRESSION: 1. 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 2. No signs of recurrent or metastatic disease in the abdomen. 3. Stable right adrenal nodule, favored to represent a benign lesions such as an adenoma. 4. Aortic atherosclerosis. 5. Additional incidental findings, as above. Electronically Signed   By: Vinnie Langton M.D.   On: 10/25/2020 10:41    Medications: I have reviewed the patient's current medications.   Assessment/Plan: 1. 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 Tumor invades the duodenal wall and vascular groove, resection margins negative, 4/34 lymph nodes positive ? MSI-stable, tumor showed instability in 2 loci as did adjacent normal tissue ? 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 CTs 01/29/2019-ill-defined pancreas head mass, 5 pulmonary nodules-1 with a small amount of central cavitation, tumor abuts the left margin of the portal vein indistinct density surrounding, hepatic artery, complex cystic lesion of the right kidney, right adrenal mass-characterized as an adenoma on a [facility] MRI 12/21/2018 ? [facility]  03/03/2019-no focal pancreas activity, no tracer accumulation within the suspicious pulmonary nodules, left uterine mass with tracer accumulation felt to represent a leiomyoma ? Elevated preoperative CA 19-9--CA 19-9 186 on 01/18/2019 ? CT chest 04/16/2019-multiple bilateral pulmonary nodules, some with increased cavitation, stable in size ? Cycle 1 FOLFIRINOX 04/27/2019 ? Cycle 2 FOLFIRINOX 05/11/2019 ? Cycle 3 FOLFIRINOX 05/23/2019 ? Cycle 4 FOLFIRINOX 06/08/2019 ? Cycle 5 FOLFIRINOX 06/22/2019 ? CT chest 07/02/2019-stable size of bilateral pulmonary nodules. Dominant cavitary lesions in both lungs show increased cavitation with thinner walls. Stable 2.1 cm right adrenal nodule. ? Cycle 6 FOLFIRINOX 07/06/2019 ? Cycle 7 FOLFIRINOX 07/21/2019 ? Cycle 8 FOLFIRINOX 08/03/2019, oxaliplatin deleted secondary to neuropathy ? CT chest 08/24/2019-decreased size of several lung nodules with resolution of a left upper lobe nodule, no new nodules ? Radiationto the pancreas surgical area with concurrent Xeloda 09/13/2019-10/20/2019 ? CTs 11/29/2019-multiple small pulmonary nodules scattered throughout the lungs bilaterally, appear increased in number and size. No definite evidence of metastatic disease in the abdomen or pelvis. Markedly enlarged and heterogeneous appearing uterus, likely to represent multifocal fibroids. 1 of these lesions appears to be an exophytic subserosal fibroid in the posterior lateral aspect of the uterine body on the left side although this comes in close proximity to the left adnexa such that a primary ovarian lesion is difficult to completely exclude. ? CTs 02/07/2020-slight enlargement of bilateral lung nodules, some are cavitary, no evidence of metastatic disease in the abdomen or pelvis, stable right adrenal nodule, uterine fibroids ? CTs 04/26/2020-mild enlargement of pulmonary nodules, slight increase in trace pelvic fluid, new soft tissue thickening inferior to the cecal tip suspicious  for peritoneal metastasis ? CT 05/26/2020-improved appearance of soft tissue at the inferior tip of the cecum, mildly thickened short appendix-findings suggestive of resolving appendicitis, stable small bibasilar pulmonary nodules, fibroids ? Plan biopsy of right cecal tip soft tissue canceled secondary to radiologic improvement ? CT chest 08/02/2020-enlargement and progressive cavitation of multiple  bilateral lung nodules.  Some new nodules are present. ? CTs 10/24/2020- increase in size of pulmonary nodules, no new nodules, no evidence of metastatic disease in the abdomen, stable right adrenal nodule  2. 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 Diabetes 4. Hypertension 5. Family history of pancreas cancer, INVITAE panel-VUS in the TERT 6. Port-A-Cath placement, Dr. 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 Mild lower abdominal pain after exercise, likely [facility] related (04/04/20) 9. Left breast mass January 22- 5 mm hypoechoic lesion at the 1 o'clock position of the left breast, biopsy- fibroadenomatoid change    Disposition: Alyssa Ross appears stable.  I reviewed the CT findings and images with her.  The CT is consistent with slow progression of metastatic pulmonary nodules.  She understands the high likelihood the pulmonary nodules represent metastatic pancreas cancer.  She appears asymptomatic and the nodules appear to be enlarging slowly over time.  We decided to continue observation.  I will present her case at the GI tumor conference to discuss the indication for biopsy and the differential diagnosis as this is an unusual time course and pattern of tumor spread for pancreas cancer.  She will return for Port-A-Cath flush in 6 weeks.  Ms. Stubbe will be scheduled for an office visit and repeat chest CT in approximately 2.5 months.  [name], MD  10/26/2020  10:58 AM

## 2020-10-27 ENCOUNTER — Other Ambulatory Visit (INDEPENDENT_AMBULATORY_CARE_PROVIDER_SITE_OTHER): Payer: BC Managed Care – PPO

## 2020-10-27 DIAGNOSIS — E1165 Type 2 diabetes mellitus with hyperglycemia: Secondary | ICD-10-CM | POA: Diagnosis not present

## 2020-10-27 DIAGNOSIS — Z794 Long term (current) use of insulin: Secondary | ICD-10-CM

## 2020-10-27 LAB — BASIC METABOLIC PANEL
BUN: 7 mg/dL (ref 6–23)
CO2: 28 mEq/L (ref 19–32)
Calcium: 9.7 mg/dL (ref 8.4–10.5)
Chloride: 103 mEq/L (ref 96–112)
Creatinine, Ser: 1.08 mg/dL (ref 0.40–1.20)
GFR: 58.17 mL/min — ABNORMAL LOW (ref >60.00)
Glucose, Bld: 216 mg/dL — ABNORMAL HIGH (ref 70–99)
Potassium: 3.6 mEq/L (ref 3.5–5.1)
Sodium: 141 mEq/L (ref 135–145)

## 2020-10-28 LAB — FRUCTOSAMINE: Fructosamine: 386 umol/L — ABNORMAL HIGH (ref 0–285)

## 2020-10-30 ENCOUNTER — Other Ambulatory Visit: Payer: Self-pay

## 2020-10-30 ENCOUNTER — Encounter: Payer: BC Managed Care – PPO | Admitting: Endocrinology

## 2020-10-30 NOTE — Progress Notes (Signed)
This encounter was created in error - please disregard.

## 2020-11-01 ENCOUNTER — Other Ambulatory Visit: Payer: Self-pay | Admitting: Nurse Practitioner

## 2020-11-01 ENCOUNTER — Other Ambulatory Visit: Payer: Self-pay

## 2020-11-01 ENCOUNTER — Telehealth: Payer: Self-pay | Admitting: Nurse Practitioner

## 2020-11-01 DIAGNOSIS — C250 Malignant neoplasm of head of pancreas: Secondary | ICD-10-CM

## 2020-11-01 NOTE — Telephone Encounter (Signed)
I contacted [name] with the tumor board recommendation for a referral to Dr. Valeta Harms.  She agrees with this plan.  Referral made.

## 2020-11-01 NOTE — Progress Notes (Signed)
The proposed treatment discussed in conference is for discussion purposes only and is not a binding recommendation.  The patients have not been physically examined, or presented with their treatment options.  Therefore, final treatment plans cannot be decided.   

## 2020-11-02 ENCOUNTER — Telehealth: Payer: Self-pay | Admitting: *Deleted

## 2020-11-02 NOTE — Telephone Encounter (Signed)
Call to [facility] and scheduled consult visit with Dr. Valeta Harms for 11/15/20 at 1045 at [address]. Patient notified and agrees to appointment. Provided address and phone [phone] in case she needs to reschedule.

## 2020-11-07 DIAGNOSIS — R87810 Cervical high risk human papillomavirus (HPV) DNA test positive: Secondary | ICD-10-CM | POA: Diagnosis not present

## 2020-11-07 DIAGNOSIS — Z124 Encounter for screening for malignant neoplasm of cervix: Secondary | ICD-10-CM | POA: Diagnosis not present

## 2020-11-07 DIAGNOSIS — D259 Leiomyoma of uterus, unspecified: Secondary | ICD-10-CM | POA: Diagnosis not present

## 2020-11-07 DIAGNOSIS — Z01419 Encounter for gynecological examination (general) (routine) without abnormal findings: Secondary | ICD-10-CM | POA: Diagnosis not present

## 2020-11-08 ENCOUNTER — Other Ambulatory Visit: Payer: Self-pay

## 2020-11-08 ENCOUNTER — Encounter: Payer: BC Managed Care – PPO | Admitting: Endocrinology

## 2020-11-08 ENCOUNTER — Ambulatory Visit: Payer: BC Managed Care – PPO | Admitting: Endocrinology

## 2020-11-08 NOTE — Progress Notes (Signed)
This encounter was created in error - please disregard.

## 2020-11-12 ENCOUNTER — Encounter: Payer: Self-pay | Admitting: Internal Medicine

## 2020-11-15 ENCOUNTER — Other Ambulatory Visit: Payer: Self-pay

## 2020-11-15 ENCOUNTER — Telehealth: Payer: Self-pay | Admitting: Pulmonary Disease

## 2020-11-15 ENCOUNTER — Encounter: Payer: Self-pay | Admitting: Pulmonary Disease

## 2020-11-15 ENCOUNTER — Ambulatory Visit: Payer: BC Managed Care – PPO | Admitting: Pulmonary Disease

## 2020-11-15 VITALS — BP 126/80 | HR 99 | Temp 97.5°F | Ht 68.0 in | Wt 198.0 lb

## 2020-11-15 DIAGNOSIS — R918 Other nonspecific abnormal finding of lung field: Secondary | ICD-10-CM | POA: Diagnosis not present

## 2020-11-15 DIAGNOSIS — D3A8 Other benign neuroendocrine tumors: Secondary | ICD-10-CM

## 2020-11-15 DIAGNOSIS — J984 Other disorders of lung: Secondary | ICD-10-CM

## 2020-11-15 NOTE — Progress Notes (Signed)
Synopsis: Referred in June 2022 for abnormal CT chest, PCP: By Owens Shark, NP  Subjective:   PATIENT ID: Alyssa Ross GENDER: female DOB: [birth year], MRN: [id number]  Chief Complaint  Patient presents with  . Consult    No complaints    This is a 55 year old female, past medical history of pancreatic neuroendocrine tumor, diabetes, hypertension.  Patient is followed by Dr. Benay Spice from medical oncology, office note from 10/26/2020 reviewed.  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  In November 2020 patient developed multiple bilateral pulmonary nodules some of which increased in size developed cavitation.  CT scan of the chest in January 2021 showed stable size of the bilateral pulmonary nodules also areas with cavitation.  Receiving chemotherapy at the time.  Patient had subsequent follow-up disease in February 2022 and May 2022 CT scan imaging with increased size of the pulmonary nodules.  Patient's imaging was reviewed at GI tumor board.  Felt that if potential spread of disease through the chest would be an unusual pattern however recommended pulmonary evaluation for consideration of biopsy.  Case was discussed with medical oncology regarding next steps. FMHx sister with hashimotos thyroid disease, sister with ?RA, uncle with RA.    Oncology History  Pancreatic cancer ([place])  03/04/2019 Initial Diagnosis   Pancreatic cancer ([place])   03/30/2019 Cancer Staging   Staging form: Exocrine Pancreas, AJCC 8th Edition - Pathologic: Stage III (pT3, pN2, cM0) - Signed by Ladell Pier, MD on 03/30/2019   04/27/2019 -  Chemotherapy   The patient had palonosetron (ALOXI) injection 0.25 mg, 0.25 mg, Intravenous,  Once, 8 of 9 cycles Administration: 0.25 mg (04/27/2019), 0.25 mg (05/11/2019), 0.25 mg (05/25/2019), 0.25 mg (06/08/2019),  0.25 mg (06/22/2019), 0.25 mg (07/06/2019), 0.25 mg (07/21/2019), 0.25 mg (08/03/2019) irinotecan (CAMPTOSAR) 300 mg in dextrose 5 % 500 mL chemo infusion, 150 mg/m2 = 300 mg (100 % of original dose 150 mg/m2), Intravenous,  Once, 8 of 9 cycles Dose modification: 150 mg/m2 (original dose 150 mg/m2, Cycle 1, Reason: Provider Judgment) Administration: 300 mg (04/27/2019), 300 mg (05/11/2019), 300 mg (05/25/2019), 300 mg (06/08/2019), 300 mg (06/22/2019), 300 mg (07/06/2019), 300 mg (07/21/2019), 300 mg (08/03/2019) leucovorin 850 mg in dextrose 5 % 250 mL infusion, 824 mg, Intravenous,  Once, 8 of 9 cycles Administration: 850 mg (04/27/2019), 824 mg (05/11/2019), 824 mg (05/25/2019), 824 mg (06/08/2019), 824 mg (06/22/2019), 824 mg (07/06/2019), 824 mg (07/21/2019) oxaliplatin (ELOXATIN) 175 mg in dextrose 5 % 500 mL chemo infusion, 85 mg/m2 = 175 mg, Intravenous,  Once, 7 of 8 cycles Administration: 175 mg (04/27/2019), 175 mg (05/11/2019), 175 mg (05/25/2019), 175 mg (06/08/2019), 175 mg (06/22/2019), 175 mg (07/06/2019), 175 mg (07/21/2019) fosaprepitant (EMEND) 150 mg, dexamethasone (DECADRON) 12 mg in sodium chloride 0.9 % 145 mL IVPB, , Intravenous,  Once, 8 of 9 cycles Administration:  (04/27/2019),  (05/11/2019),  (05/25/2019),  (06/08/2019),  (06/22/2019),  (07/06/2019),  (07/21/2019),  (08/03/2019) fluorouracil (ADRUCIL) 4,100 mg in sodium chloride 0.9 % 68 mL chemo infusion, 2,000 mg/m2 = 4,100 mg (100 % of original dose 2,000 mg/m2), Intravenous, 1 Day/Dose, 8 of 9 cycles Dose modification: 2,000 mg/m2 (original dose 2,000 mg/m2, Cycle 1, Reason: Provider Judgment) Administration: 4,100 mg (04/27/2019), 4,100 mg (05/11/2019), 4,100 mg (05/25/2019), 4,100 mg (06/08/2019), 4,100 mg (06/22/2019), 4,100 mg (07/06/2019), 4,100 mg (07/21/2019), 4,100 mg (08/03/2019)  for chemotherapy  treatment.       Past Medical History:  Diagnosis Date  . Anemia   . ASCUS (atypical squamous cells of undetermined significance) on Pap  smear 08/06/1999  . Breast mass in female 2002   Left  . Diabetes mellitus without complication (HCC)   . Family history of pancreatic cancer   . Fibroid uterus 2010  . Hypertension   . Irregular bleeding 2011  . 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 PONV (postoperative nausea and vomiting)    nausea  vomitting after 12/25/18 ERCP  . 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 Yeast vaginitis 2006     Family History  Problem Relation Age of Onset  . Diabetes Mother   . Dementia Mother   . Hyperlipidemia Mother   . Hypertension Mother   . Irregular heart beat Mother   . Diabetes Father   . Hypertension Father   . Hyperlipidemia Father   . Down syndrome Sister   . Diabetes Sister   . Hyperlipidemia Brother   . Heart Problems Brother   . Goiter Maternal Aunt   . Thyroid nodules Sister   . Cancer Cousin 60       eye; maternal first cousin  . Goiter Cousin   . Cancer Paternal Aunt        unknown form of cancer  . Cancer Cousin        unknown form of cancer; paternal first cousin  . Pancreatic cancer Cousin 68       paternal first cousin  . Cancer Cousin 65       unknown cancer; paternal first cousin  . Cancer Cousin 68       unknown cancer; paternal first cousin     Past Surgical History:  Procedure Laterality Date  . BILIARY STENT PLACEMENT N/A 12/25/2018   Procedure: BILIARY STENT PLACEMENT;  Surgeon: Hung, Patrick, MD;  Location: [facility];  Service: Endoscopy;  Laterality: N/A;  . BILIARY STENT PLACEMENT N/A 01/01/2019   Procedure: BILIARY STENT PLACEMENT;  Surgeon: Hung, Patrick, MD;  Location: [facility];  Service: Endoscopy;  Laterality: N/A;  . DILATATION & CURETTAGE/HYSTEROSCOPY WITH TRUECLEAR N/A 01/20/2014   Procedure: DILATATION & CURETTAGE/HYSTEROSCOPY WITH TRUCLEAR;  Surgeon: Naima A Dillard, MD;  Location: [facility];  Service: Gynecology;  Laterality:  N/A;  . DILATATION & CURRETTAGE/HYSTEROSCOPY WITH RESECTOCOPE N/A 01/20/2014   Procedure: DILATATION & CURETTAGE/HYSTEROSCOPY WITH RESECTOCOPE;  Surgeon: Naima A Dillard, MD;  Location: [facility];  Service: Gynecology;  Laterality: N/A;  . ENDOSCOPIC RETROGRADE CHOLANGIOPANCREATOGRAPHY (ERCP) WITH PROPOFOL N/A 12/25/2018   Procedure: ENDOSCOPIC RETROGRADE CHOLANGIOPANCREATOGRAPHY (ERCP) WITH PROPOFOL;  Surgeon: Hung, Patrick, MD;  [facility];  Service: Endoscopy;  Laterality: N/A;  . ENDOSCOPIC RETROGRADE CHOLANGIOPANCREATOGRAPHY (ERCP) WITH PROPOFOL N/A 01/01/2019   Procedure: ENDOSCOPIC RETROGRADE CHOLANGIOPANCREATOGRAPHY (ERCP) WITH PROPOFOL;  Surgeon: Hung, Patrick, MD;  [facility];  Service: Endoscopy;  Laterality: N/A;  . ERCP  12/25/2018  . FINE NEEDLE ASPIRATION N/A 01/01/2019   Procedure: FINE NEEDLE ASPIRATION (FNA) LINEAR;  Surgeon: Hung, Patrick, MD;  [facility];  Service: Endoscopy;  Laterality: N/A;  . LAPAROSCOPY N/A 03/04/2019   Procedure: LAPAROSCOPY DIAGNOSTIC;  Surgeon: Byerly, Faera, MD;  Location: [facility];  Service: General;  Laterality: N/A;  GENERAL AND EPIDURAL  . NO PAST SURGERIES    . PARTIAL NEPHRECTOMY Right 03/04/2019   Procedure: Nephrectomy Partial;  Surgeon: McKenzie, Patrick L, MD;    Location: [facility];  Service: Urology;  Laterality: Right;  . PORTACATH PLACEMENT Left 04/21/2019   Procedure: INSERTION PORT-A-CATH;  Surgeon: Byerly, Faera, MD;  Location: [facility];  Service: General;  Laterality: Left;  . SPHINCTEROTOMY  12/25/2018   Procedure: SPHINCTEROTOMY;  Surgeon: Hung, Patrick, MD;  [facility];  Service: Endoscopy;;  . STENT REMOVAL  01/01/2019   Procedure: STENT REMOVAL;  Surgeon: Hung, Patrick, MD;  [facility];  Service: Endoscopy;;  . UPPER ESOPHAGEAL ENDOSCOPIC ULTRASOUND (EUS) N/A 01/01/2019   Procedure: UPPER ESOPHAGEAL ENDOSCOPIC ULTRASOUND (EUS);  Surgeon: Hung, Patrick, MD;  [facility];  Service:  Endoscopy;  Laterality: N/A;  . WHIPPLE PROCEDURE N/A 03/04/2019   Procedure: WHIPPLE PROCEDURE;  Surgeon: Byerly, Faera, MD;  Location: [facility];  Service: General;  Laterality: N/A;  GENERAL AND EPIDURAL    Social History   Socioeconomic History  . Marital status: Single    Spouse name: Not on file  . Number of children: Not on file  . Years of education: Not on file  . Highest education level: Not on file  Occupational History  . Not on file  Tobacco Use  . Smoking status: Never Smoker  . Smokeless tobacco: Never Used  Vaping Use  . Vaping Use: Never used  Substance and Sexual Activity  . Alcohol use: Yes    Comment: rarely  . Drug use: No  . Sexual activity: Yes    Birth control/protection: Post-menopausal  Other Topics Concern  . Not on file  Social History Narrative  . Not on file   Social Determinants of Health   Financial Resource Strain: Not on file  Food Insecurity: Not on file  Transportation Needs: Not on file  Physical Activity: Not on file  Stress: Not on file  Social Connections: Not on file  Intimate Partner Violence: Not on file     Allergies  Allergen Reactions  . Cherry Rash  . Lemon Oil Rash     Outpatient Medications Prior to Visit  Medication Sig Dispense Refill  . Continuous Blood Gluc Receiver (FREESTYLE LIBRE 14 DAY READER) DEVI USE READER TO CHECK BLOOD GLUCOSE WITH FREESTYLE LIBRE SENSORS. 1 Device 0  . Continuous Blood Gluc Sensor (FREESTYLE LIBRE 2 SENSOR) MISC APPLY EVERY 14 DAYS 2 each 5  . dapagliflozin propanediol (FARXIGA) 5 MG TABS tablet Take 1 tablet (5 mg total) by mouth daily. 30 tablet 3  . glucose blood test strip Check sugar 2 times daily. 100 each 12  . HUMALOG MIX 75/25 KWIKPEN (75-25) 100 UNIT/ML Kwikpen ADMINISTER 24 UNITS UNDER THE SKIN EVERY DAY BEFORE LUNCH (Patient taking differently: Inject 18 Units into the skin daily before breakfast.) 30 mL 2  . Insulin Degludec (TRESIBA FLEXTOUCH ) Inject into the skin. 10  units daily    . insulin lispro (HUMALOG KWIKPEN) 100 UNIT/ML KwikPen Inject 20 units under the skin three times daily before meals. (Patient taking differently: Inject 3-20 Units into the skin 3 (three) times daily as needed (high blood sugar).) 60 pen 3  . Insulin Pen Needle (NOVOFINE PLUS PEN NEEDLE) 32G X 4 MM MISC USE TO INJECT INSULIN THREE TIMES DAILY. E11.65 300 each 1  . KLOR-CON M20 20 MEQ tablet TAKE 1 TABLET DAILY 90 tablet 3  . lidocaine-prilocaine (EMLA) cream Apply 1 application topically as directed. Apply 1 hour prior to stick and cover with plastic wrap 30 g 5  . lisinopril-hydrochlorothiazide (ZESTORETIC) 10-12.5 MG tablet TAKE 1 TABLET DAILY 90 tablet 3  .   loratadine (CLARITIN) 10 MG tablet Take 10 mg by mouth at bedtime.     . magnesium oxide (MAG-OX) 400 (241.3 Mg) MG tablet TAKE 1 TABLET(400 MG) BY MOUTH TWICE DAILY (Patient taking differently: Take 400 mg by mouth 2 (two) times daily.) 60 tablet 2  . metFORMIN (GLUCOPHAGE-XR) 500 MG 24 hr tablet TAKE 4 TABLETS ONCE DAILY (Patient taking differently: Take 2,000 mg by mouth at bedtime.) 360 tablet 3  . rosuvastatin (CRESTOR) 10 MG tablet Take 1 tablet (10 mg total) by mouth at bedtime. 90 tablet 1   Facility-Administered Medications Prior to Visit  Medication Dose Route Frequency Provider Last Rate Last Admin  . sodium chloride flush (NS) 0.9 % injection 10 mL  10 mL Intracatheter PRN Sherrill, Gary B, MD   10 mL at 05/27/19 1342    Review of Systems  Constitutional: Negative for chills, fever, malaise/fatigue and weight loss.  HENT: Negative for hearing loss, sore throat and tinnitus.   Eyes: Negative for blurred vision and double vision.  Respiratory: Negative for cough, hemoptysis, sputum production, shortness of breath, wheezing and stridor.   Cardiovascular: Negative for chest pain, palpitations, orthopnea, leg swelling and PND.  Gastrointestinal: Negative for abdominal pain, constipation, diarrhea, heartburn, nausea  and vomiting.  Genitourinary: Negative for dysuria, hematuria and urgency.  Musculoskeletal: Negative for joint pain and myalgias.  Skin: Negative for itching and rash.  Neurological: Negative for dizziness, tingling, weakness and headaches.  Endo/Heme/Allergies: Negative for environmental allergies. Does not bruise/bleed easily.  Psychiatric/Behavioral: Negative for depression. The patient is not nervous/anxious and does not have insomnia.   All other systems reviewed and are negative.    Objective:  Physical Exam Vitals reviewed.  Constitutional:      General: She is not in acute distress.    Appearance: She is well-developed.  HENT:     Head: Normocephalic and atraumatic.  Eyes:     General: No scleral icterus.    Conjunctiva/sclera: Conjunctivae normal.     Pupils: Pupils are equal, round, and reactive to light.  Neck:     Vascular: No JVD.     Trachea: No tracheal deviation.  Cardiovascular:     Rate and Rhythm: Normal rate and regular rhythm.     Heart sounds: Normal heart sounds. No murmur heard.   Pulmonary:     Effort: Pulmonary effort is normal. No tachypnea, accessory muscle usage or respiratory distress.     Breath sounds: No stridor. No wheezing, rhonchi or rales.  Abdominal:     General: Bowel sounds are normal. There is no distension.     Palpations: Abdomen is soft.     Tenderness: There is no abdominal tenderness.  Musculoskeletal:        General: No tenderness.     Cervical back: Neck supple.  Lymphadenopathy:     Cervical: No cervical adenopathy.  Skin:    General: Skin is warm and dry.     Capillary Refill: Capillary refill takes less than 2 seconds.     Findings: No rash.  Neurological:     Mental Status: She is alert and oriented to person, place, and time.  Psychiatric:        Behavior: Behavior normal.      Vitals:   11/15/20 1041  BP: 126/80  Pulse: 99  Temp: (!) 97.5 F (36.4 C)  SpO2: 98%  Weight: 198 lb (89.8 kg)  Height: 5'  8" (1.727 m)   98% on RA BMI Readings from Last 3 Encounters:    11/15/20 30.11 kg/m  10/30/20 31.38 kg/m  10/26/20 32.14 kg/m   Wt Readings from Last 3 Encounters:  11/15/20 198 lb (89.8 kg)  10/30/20 206 lb 6.4 oz (93.6 kg)  10/26/20 205 lb 3.2 oz (93.1 kg)     CBC    Component Value Date/Time   WBC 4.6 07/31/2020 1113   WBC 4.1 05/26/2020 0956   RBC 3.87 07/31/2020 1113   RBC 3.42 (L) 05/26/2020 0956   HGB 11.8 07/31/2020 1113   HGB 11.6 07/15/2014 1048   HCT 36.8 07/31/2020 1113   HCT 35.8 07/15/2014 1048   PLT 340 07/31/2020 1113   MCV 95 07/31/2020 1113   MCV 88.6 07/15/2014 1048   MCH 30.5 07/31/2020 1113   MCH 29.8 05/26/2020 0956   MCHC 32.1 07/31/2020 1113   MCHC 31.3 05/26/2020 0956   RDW 12.5 07/31/2020 1113   RDW 12.9 07/15/2014 1048   LYMPHSABS 1.2 05/26/2020 0956   LYMPHSABS 2.4 07/15/2014 1048   MONOABS 0.3 05/26/2020 0956   MONOABS 0.3 07/15/2014 1048   EOSABS 0.1 05/26/2020 0956   EOSABS 0.2 07/15/2014 1048   BASOSABS 0.0 05/26/2020 0956   BASOSABS 0.0 07/15/2014 1048      Chest Imaging: 10/24/2020: Multiple bilateral thick-walled cavitary lesions that have progressed in size over the past year. The patient's images have been independently reviewed by me.    Pulmonary Functions Testing Results: No flowsheet data found.  FeNO:   Pathology:   Echocardiogram:   Heart Catheterization:     Assessment & Plan:     ICD-10-CM   1. Cavitary lesion of lung  J98.4 Ambulatory referral to Pulmonology    Procedural/ Surgical Case Request: VIDEO BRONCHOSCOPY WITH ENDOBRONCHIAL NAVIGATION    ANA,IFA RA Diag Pnl w/rflx Tit/Patn    Sjogrens syndrome-A extractable nuclear antibody    Sjogrens syndrome-B extractable nuclear antibody    ANCA Screen Reflex Titer    Anti-scleroderma antibody    Sed Rate (ESR)    Cyclic citrul peptide antibody, IgG    Mpo/pr-3 (anca) antibodies    Angiotensin converting enzyme    Anti-scleroderma antibody  2.  Primary pancreatic neuroendocrine tumor  D3A.8   3. Multiple pulmonary nodules  R91.8     Discussion:  This is a 54-year-old female, family history of rheumatologic diseases, Hashimoto's with a sister, sister with RA, uncle with RA.  Patient has had a diagnosis of pancreatic cancer.  She underwent treatments for this and surgery.  She had multiple small pulmonary nodules dating back since August 2020.  After being diagnosed with pancreatic cancer she underwent surgery and subsequent follow-up of these nodules.  During the time in which she was receiving chemotherapy these nodules stayed small and stable.  Some of the more dominant nodules had cavitation at the time.  She had subsequently been followed through the calendar year 2021 into 2022 with surveillance CT imaging.  The cavitary nodules have become larger.  Over calendar year 2022.  Clinically since she does not have any infectious symptoms night sweats fever.  I suspect we are dealing with a potential autoimmune disease of the lung or vasculitis.  Does not appear infectious although does not rule out atypical infections such as cavitary NTM or fungal disease.  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  Other options would be necrobiotic nodules that tend to cavitate related to   rheumatoid arthritis.  Plan: I think next best steps would be to complete blood work to look for potential underlying vasculitis. Please see orders above. We also discussed planning for navigational bronchoscopy and tissue biopsy of the thick-walled portion of the cavitary lesion. Pending pathology and lab work will consider next steps. We will also send tissue for culture to rule out any potential infectious etiology. I think bronchoscopy and lab work are the next to best steps to help determine potential underlying  etiology of her multiple cavitary lesions.    Current Outpatient Medications:  .  Continuous Blood Gluc Receiver (FREESTYLE LIBRE 14 DAY READER) DEVI, USE READER TO CHECK BLOOD GLUCOSE WITH FREESTYLE LIBRE SENSORS., Disp: 1 Device, Rfl: 0 .  Continuous Blood Gluc Sensor (FREESTYLE LIBRE 2 SENSOR) MISC, APPLY EVERY 14 DAYS, Disp: 2 each, Rfl: 5 .  dapagliflozin propanediol (FARXIGA) 5 MG TABS tablet, Take 1 tablet (5 mg total) by mouth daily., Disp: 30 tablet, Rfl: 3 .  glucose blood test strip, Check sugar 2 times daily., Disp: 100 each, Rfl: 12 .  HUMALOG MIX 75/25 KWIKPEN (75-25) 100 UNIT/ML Kwikpen, ADMINISTER 24 UNITS UNDER THE SKIN EVERY DAY BEFORE LUNCH (Patient taking differently: Inject 18 Units into the skin daily before breakfast.), Disp: 30 mL, Rfl: 2 .  Insulin Degludec (TRESIBA FLEXTOUCH [place]), Inject into the skin. 10 units daily, Disp: , Rfl:  .  insulin lispro (HUMALOG KWIKPEN) 100 UNIT/ML KwikPen, Inject 20 units under the skin three times daily before meals. (Patient taking differently: Inject 3-20 Units into the skin 3 (three) times daily as needed (high blood sugar).), Disp: 60 pen, Rfl: 3 .  Insulin Pen Needle (NOVOFINE PLUS PEN NEEDLE) 32G X 4 MM MISC, USE TO INJECT INSULIN THREE TIMES DAILY. E11.65, Disp: 300 each, Rfl: 1 .  KLOR-CON M20 20 MEQ tablet, TAKE 1 TABLET DAILY, Disp: 90 tablet, Rfl: 3 .  lidocaine-prilocaine (EMLA) cream, Apply 1 application topically as directed. Apply 1 hour prior to stick and cover with plastic wrap, Disp: 30 g, Rfl: 5 .  lisinopril-hydrochlorothiazide (ZESTORETIC) 10-12.5 MG tablet, TAKE 1 TABLET DAILY, Disp: 90 tablet, Rfl: 3 .  loratadine (CLARITIN) 10 MG tablet, Take 10 mg by mouth at bedtime. , Disp: , Rfl:  .  magnesium oxide (MAG-OX) 400 (241.3 Mg) MG tablet, TAKE 1 TABLET(400 MG) BY MOUTH TWICE DAILY (Patient taking differently: Take 400 mg by mouth 2 (two) times daily.), Disp: 60 tablet, Rfl: 2 .  metFORMIN (GLUCOPHAGE-XR) 500 MG 24 hr  tablet, TAKE 4 TABLETS ONCE DAILY (Patient taking differently: Take 2,000 mg by mouth at bedtime.), Disp: 360 tablet, Rfl: 3 .  rosuvastatin (CRESTOR) 10 MG tablet, Take 1 tablet (10 mg total) by mouth at bedtime., Disp: 90 tablet, Rfl: 1 No current facility-administered medications for this visit.  Facility-Administered Medications Ordered in Other Visits:  .  sodium chloride flush (NS) 0.9 % injection 10 mL, 10 mL, Intracatheter, PRN, Sherrill, Gary B, MD, 10 mL at 05/27/19 1342  I spent 65 minutes dedicated to the care of this patient on the date of this encounter to include pre-visit review of records, face-to-face time with the patient discussing conditions above, post visit ordering of testing, clinical documentation with the electronic health record, making appropriate referrals as documented, and communicating necessary findings to members of the patients care team.   Bradley L Icard, DO [facility] 11/15/2020 10:52 AM    

## 2020-11-15 NOTE — Patient Instructions (Addendum)
Thank you for visiting Dr. Valeta Harms at [facility]. Today we recommend the following:  Orders Placed This Encounter  Procedures  . Procedural/ Surgical Case Request: VIDEO BRONCHOSCOPY WITH ENDOBRONCHIAL NAVIGATION  . Ambulatory referral to Pulmonology   Lab work today  Return in about 4 weeks (around 12/13/2020) for with APP or Dr. Valeta Harms.    Please do your part to reduce the spread of COVID-19.

## 2020-11-15 NOTE — Telephone Encounter (Signed)
Pt has been scheduled for ENB on 6/28 at 9:15 at [facility].  Covid test 6/24 at 2:40.  Appt info given to pt.  Disk will be picked up at [facility] and brought to office & will be sent to [facility].

## 2020-11-16 LAB — ANTI-SCLERODERMA ANTIBODY: Scleroderma (Scl-70) (ENA) Antibody, IgG: <1 AI

## 2020-11-17 LAB — ANTI-SCLERODERMA ANTIBODY: Scleroderma (Scl-70) (ENA) Antibody, IgG: <1 AI

## 2020-11-17 LAB — SJOGRENS SYNDROME-A EXTRACTABLE NUCLEAR ANTIBODY: SSA (Ro) (ENA) Antibody, IgG: <1 AI

## 2020-11-17 LAB — ANA,IFA RA DIAG PNL W/RFLX TIT/PATN
Anti Nuclear Antibody (ANA): POSITIVE — AB
Cyclic Citrullin Peptide Ab: <16 UNITS
Rheumatoid fact SerPl-aCnc: <14 IU/mL (ref <14)

## 2020-11-17 LAB — MPO/PR-3 (ANCA) ANTIBODIES
Myeloperoxidase Abs: <1 AI
Serine Protease 3: <1 AI

## 2020-11-17 LAB — ANTI-NUCLEAR AB-TITER (ANA TITER): ANA Titer 1: 1:80 {titer} — ABNORMAL HIGH

## 2020-11-17 LAB — SJOGRENS SYNDROME-B EXTRACTABLE NUCLEAR ANTIBODY: SSB (La) (ENA) Antibody, IgG: <1 AI

## 2020-11-27 ENCOUNTER — Other Ambulatory Visit: Payer: Self-pay | Admitting: Oncology

## 2020-12-01 ENCOUNTER — Other Ambulatory Visit (HOSPITAL_COMMUNITY): Payer: BC Managed Care – PPO

## 2020-12-01 ENCOUNTER — Telehealth (INDEPENDENT_AMBULATORY_CARE_PROVIDER_SITE_OTHER): Payer: BC Managed Care – PPO | Admitting: Endocrinology

## 2020-12-01 VITALS — Ht 68.5 in | Wt 199.0 lb

## 2020-12-01 DIAGNOSIS — E876 Hypokalemia: Secondary | ICD-10-CM

## 2020-12-01 DIAGNOSIS — E1165 Type 2 diabetes mellitus with hyperglycemia: Secondary | ICD-10-CM

## 2020-12-01 DIAGNOSIS — Z794 Long term (current) use of insulin: Secondary | ICD-10-CM | POA: Diagnosis not present

## 2020-12-01 NOTE — Progress Notes (Signed)
Patient ID: Alyssa Ross, female   DOB: 1966/06/02, 55 y.o.   MRN: [id number]  I connected with the above-named patient by video enabled telemedicine application and verified that I am speaking with the correct person. The patient was explained the limitations of evaluation and management by telemedicine and the availability of in person appointments.  Patient also understood that there may be a patient responsible charge related to this service  Location of the patient: Patient's home  Location of the provider: Physician office Only the patient and myself were participating in the encounter The patient understood the above statements and agreed to proceed.   Reason for Appointment: Diabetes follow-up    History of Present Illness   Diagnosis: Type 2 DIABETES MELITUS, date of diagnosis 2005   She had previously been on metformin and Amaryl Because of inadequate control she was given Victoza in addition in 2011 With this her blood sugars have been significantly better and her A1c has been as low as 6.2 in 2013 Her blood sugars have been more difficult to control since 2014 an A1c consistently over 7% She  was started on insulin with Levemir in 06/2013 because of  increase in her A1c to 8.5% She had previously been taking Victoza along with Amaryl and metformin without consistent control Also had difficulty losing weight despite taking 1.8 mg Victoza, also had tried the 3 mg dose Because of episodic nausea and diarrhea in 1/16 she went off her Victoza  RECENT history:   INSULIN dose:  Humalog mix 75/25, 18 units at breakfast, 10 units before dinner, Humalog 5 to 10 units before meals  Non-insulin hypoglycemic drugs: on metformin 2000 mg,  A1c is last 8 compared to 8.5 Fructosamine most recently 386 in 5/22  Current management, blood sugars and problems identified as follows She has had some improvement in her blood sugars compared to the last visit She was able to stop her  [place] and start Humalog mix at suppertime also With this her bedtime readings are significantly better and also somewhat better in the mornings and overnight However she has periods of significant hyperglycemia either after breakfast or lunch or continuously during the day As discussed below that this is quite variable  She says her sugars are better because she is trying to cut back on carbohydrates and reduce portions of meats She is working with unknown dietitian She is periodically exercising and she does not adjust her insulin on the mornings she has gone for exercise Not clear if her weight is improved Appears that sometimes when she is having near normal blood sugars she will likely not take any Humalog and blood sugars will rebound significantly. She did not start [place] despite explaining its benefits on the last visit Also even though she thinks she is eating low carbohydrate meals and lunchtime she still has significant postprandial hyperglycemia usually Overall highest blood sugars are late afternoon  Freestyle libre patterns are analyzed as follows:  She has not compared her freestyle libre to fingersticks Her blood sugar patterns indicate excellent blood sugars overnight overall, progressive rise in blood sugars between 6 AM and 4 PM and then declining gradually Compared to last visit bedtime readings are significantly better and also somewhat better in the mornings and overnight However she has periods of significant hyperglycemia either after breakfast or lunch or continuously during the day Overnight blood sugars generally are averaging in the 120-150 range However blood sugars are the highest between 2-4 PM although inconsistent May  have occasional low normal readings at some point in the afternoon or before dinner or bedtime but usually transient POSTPRANDIAL readings are quite variable after all the meals including breakfast but generally highest in the  afternoons   CGM use % of time 95  2-week average/GV 172/40  Time in range      62%  % Time Above 180 21  % Time above 250 16  % Time Below 70 1     PRE-MEAL Fasting Lunch Dinner Bedtime Overall  Glucose range:       Averages: 131 200 196 164 172   POST-MEAL PC Breakfast PC Lunch PC Dinner  Glucose range:     Averages: 198 228 192   PREVIOUSLY  CGM use % of time  96  2-week average/GV  194/36  Time in range    49    % was 47  % Time Above 180  30  % Time above 250  21  % Time Below 70 0     PRE-MEAL Fasting Lunch Dinner Bedtime Overall  Glucose range:       Averages:  142  189  200  245    POST-MEAL PC Breakfast PC Lunch PC Dinner  Glucose range:     Averages:  170  180  242     Meals: 3 meals per day. Dinner 8-9 pm     DIET: As above         Wt Readings from Last 3 Encounters:  12/01/20 199 lb (90.3 kg)  11/15/20 198 lb (89.8 kg)  10/30/20 206 lb 6.4 oz (93.6 kg)    LABS:  Lab Results  Component Value Date   HGBA1C 8.0 (H) 07/25/2020   HGBA1C 8.5 (H) 03/09/2020   HGBA1C 7.7 (H) 12/07/2019   Lab Results  Component Value Date   MICROALBUR 30 07/31/2020   LDLCALC 81 07/31/2020   CREATININE 1.08 10/27/2020    No visits with results within 1 Week(s) from this visit.  Latest known visit with results is:  Office Visit on 11/15/2020  Component Date Value Ref Range Status   Anti Nuclear Antibody (ANA) 11/15/2020 POSITIVE (A) NEGATIVE Final   Comment: ANA IFA is a first line screen for detecting the presence of up to approximately 150 autoantibodies in various autoimmune diseases. A positive ANA IFA result is suggestive of autoimmune disease and reflexes to titer and pattern. Further laboratory testing may be considered if clinically indicated. . For additional information, please refer to http://education.[facility].com/faq/FAQ177 (This link is being provided for informational/ educational purposes only.) .    Rhuematoid fact SerPl-aCnc  11/15/2020 <14  <14 IU/mL Final   Cyclic Citrullin Peptide Ab 11/15/2020 <16  UNITS Final   Comment: Reference Range Negative:            <20 Weak Positive:       20-39 Moderate Positive:   40-59 Strong Positive:     >59 .    INTERPRETATION 11/15/2020    Final   Comment: . A positive ANA, IFA indicates that one or more  antibodies associated with connective tissue disease could be positive. The RF and CCP assays are each  65-70% sensitive for established rheumatoid arthritis.  While it is still possible that this patient has  rheumatoid arthritis, other connective tissue  diseases should be considered.  .    SSA (Ro) (ENA) Antibody, IgG 11/15/2020 <1.0 NEG  <1.0 NEG AI Final   SSB (La) (ENA) Antibody, IgG 11/15/2020 <1.0 NEG  <  1.0 NEG AI Final   Scleroderma (Scl-70) (ENA) Antibod* 11/15/2020 <1.0 NEG  <1.0 NEG AI Final   Myeloperoxidase Abs 11/15/2020 <1.0  AI Final   Comment:      Value        Interpretation      -----        --------------      <1.0         No Antibody Detected      > or = 1.0   Antibody Detected . Autoantibodies to myeloperoxidase (MPO) are commonly associated with the following small-vessel vasculitides: microscopic polyangiitis, polyarteritis nodosa, Churg-Strauss syndrome, necrotizing and crescentic glomerulonephritis and occasionally granulomatosis with polyangiitis (GPA, Wegener's). The perinuclear IFA pattern, (p-ANCA) is based largely on autoantibody to  myeloperoxidase which serves as the primary antigen. These autoantibodies are present in active disease. .    Serine Protease 3 11/15/2020 <1.0  AI Final   Comment:      Value        Interpretation      -----        --------------      <1.0         No Antibody Detected      > or = 1.0   Antibody Detected . Autoantibodies to proteinase-3 (PR-3) are accepted as characteristic for granulomatosis with polyangiitis (GPA, Wegener's), and are detectable in 95% of the histologically proven cases.  The cytoplasmic IFA pattern, (c-ANCA), is based largely on autoantibody to PR-3 which serves as the primary antigen. These autoantibodies are present in active disease. .    Scleroderma (Scl-70) (ENA) Antibod* 11/15/2020 <1.0 NEG  <1.0 NEG AI Final   ANA Titer 1 11/15/2020 1:80 (A) titer Final   Comment: A low level ANA titer may be present in pre-clinical autoimmune diseases and normal individuals.                 Reference Range                 <1:40        Negative                 1:40-1:80    Low Antibody Level                 >1:80        Elevated Antibody Level .    ANA Pattern 1 11/15/2020 Nuclear, Dense Fine Speckled (A)  Final   Comment: Dense fine speckled pattern is seen in normal individuals and rarely associated with systemic lupus erythematosis (SLE), Sjogren's syndrome and systemic sclerosis. . AC-2: Dense Fine Speckled . International Consensus on ANA Patterns (https://www.hernandez-brewer.com/)     Allergies as of 12/01/2020       Reactions   Cherry Rash   Lemon Oil Rash        Medication List        Accurate as of December 01, 2020 11:19 AM. If you have any questions, ask your nurse or doctor.          dapagliflozin propanediol 5 MG Tabs tablet Commonly known as: Farxiga Take 1 tablet (5 mg total) by mouth daily.   FreeStyle Libre 14 Day Reader Kerrin Mo USE READER TO CHECK BLOOD GLUCOSE WITH FREESTYLE LIBRE SENSORS.   FreeStyle Libre 2 Sensor Misc APPLY EVERY 14 DAYS   glucose blood test strip Check sugar 2 times daily.   HumaLOG Mix 75/25 KwikPen (75-25) 100 UNIT/ML Kwikpen Generic drug: Insulin Lispro Prot & Lispro  ADMINISTER 24 UNITS UNDER THE SKIN EVERY DAY BEFORE LUNCH What changed:  how much to take how to take this when to take this additional instructions   insulin lispro 100 UNIT/ML KwikPen Commonly known as: HumaLOG KwikPen Inject 20 units under the skin three times daily before meals. What changed:  how much to  take how to take this when to take this reasons to take this additional instructions   Klor-Con M20 20 MEQ tablet Generic drug: potassium chloride SA TAKE 1 TABLET DAILY   lidocaine-prilocaine cream Commonly known as: EMLA Apply 1 application topically as directed. Apply 1 hour prior to stick and cover with plastic wrap   lisinopril-hydrochlorothiazide 10-12.5 MG tablet Commonly known as: ZESTORETIC TAKE 1 TABLET DAILY   loratadine 10 MG tablet Commonly known as: CLARITIN Take 10 mg by mouth at bedtime.   MAGnesium-Oxide 400 (240 Mg) MG tablet Generic drug: magnesium oxide TAKE 1 TABLET(400 MG) BY MOUTH TWICE DAILY   metFORMIN 500 MG 24 hr tablet Commonly known as: GLUCOPHAGE-XR TAKE 4 TABLETS ONCE DAILY What changed:  how much to take how to take this when to take this additional instructions   NovoFine Plus Pen Needle 32G X 4 MM Misc Generic drug: Insulin Pen Needle USE TO INJECT INSULIN THREE TIMES DAILY. E11.65   rosuvastatin 10 MG tablet Commonly known as: CRESTOR Take 1 tablet (10 mg total) by mouth at bedtime.   TRESIBA FLEXTOUCH [place] Inject into the skin. 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   pancreatic ca dx'd 11/2018   Neuroendocrine Tumor of the pancreas   PONV (postoperative nausea and vomiting)    nausea  vomitting after 12/25/18 ERCP   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   Yeast vaginitis 2006    Past Surgical History:  Procedure Laterality Date   BILIARY STENT PLACEMENT N/A  12/25/2018   Procedure: BILIARY STENT PLACEMENT;  Surgeon: Carol Ada, MD;  Location: [facility];  Service: Endoscopy;  Laterality: N/A;   BILIARY STENT PLACEMENT N/A 01/01/2019   Procedure: BILIARY STENT PLACEMENT;  Surgeon: Carol Ada, MD;  Location: [facility];  Service: Endoscopy;  Laterality: N/A;   DILATATION & CURETTAGE/HYSTEROSCOPY WITH TRUECLEAR N/A 01/20/2014   Procedure: DILATATION & CURETTAGE/HYSTEROSCOPY WITH TRUCLEAR;  Surgeon: [name], MD;  Location: [facility];  Service: Gynecology;  Laterality: N/A;   DILATATION & CURRETTAGE/HYSTEROSCOPY WITH RESECTOCOPE N/A 01/20/2014   Procedure: DILATATION & CURETTAGE/HYSTEROSCOPY WITH RESECTOCOPE;  Surgeon: [name], MD;  Location: [facility];  Service: Gynecology;  Laterality: N/A;   ENDOSCOPIC RETROGRADE CHOLANGIOPANCREATOGRAPHY (ERCP) WITH PROPOFOL N/A 12/25/2018   Procedure: ENDOSCOPIC RETROGRADE CHOLANGIOPANCREATOGRAPHY (ERCP) WITH PROPOFOL;  Surgeon: Carol Ada, MD;  Location: [facility];  Service: Endoscopy;  Laterality: N/A;   ENDOSCOPIC RETROGRADE CHOLANGIOPANCREATOGRAPHY (ERCP) WITH PROPOFOL N/A 01/01/2019   Procedure: ENDOSCOPIC RETROGRADE CHOLANGIOPANCREATOGRAPHY (ERCP) WITH PROPOFOL;  Surgeon: Carol Ada, MD;  Location: [facility];  Service: Endoscopy;  Laterality: N/A;   ERCP  12/25/2018  FINE NEEDLE ASPIRATION N/A 01/01/2019   Procedure: FINE NEEDLE ASPIRATION (FNA) LINEAR;  Surgeon: Carol Ada, MD;  Location: [facility];  Service: Endoscopy;  Laterality: N/A;   LAPAROSCOPY N/A 03/04/2019   Procedure: LAPAROSCOPY DIAGNOSTIC;  Surgeon: Stark Klein, MD;  Location: [facility];  Service: General;  Laterality: N/A;  GENERAL AND EPIDURAL   NO PAST SURGERIES     PARTIAL NEPHRECTOMY Right 03/04/2019   Procedure: Nephrectomy Partial;  Surgeon: Cleon Gustin, MD;  Location: [facility];  Service: Urology;  Laterality: Right;   PORTACATH PLACEMENT Left 04/21/2019   Procedure: INSERTION PORT-A-CATH;  Surgeon: Stark Klein, MD;  Location: [facility];  Service: General;  Laterality: Left;   SPHINCTEROTOMY  12/25/2018   Procedure: Joan Mayans;  Surgeon: Carol Ada, MD;  Location: [facility];  Service: Endoscopy;;   STENT REMOVAL  01/01/2019   Procedure: STENT REMOVAL;  Surgeon: Carol Ada, MD;  Location: [facility];  Service: Endoscopy;;   UPPER ESOPHAGEAL ENDOSCOPIC ULTRASOUND (EUS) N/A 01/01/2019   Procedure: UPPER ESOPHAGEAL ENDOSCOPIC ULTRASOUND (EUS);  Surgeon: Carol Ada, MD;  Location: [facility];  Service: Endoscopy;  Laterality: N/A;   WHIPPLE PROCEDURE N/A 03/04/2019   Procedure: WHIPPLE PROCEDURE;  Surgeon: Stark Klein, MD;  Location: [facility];  Service: General;  Laterality: N/A;  GENERAL AND EPIDURAL    Family History  Problem Relation Age of Onset   Diabetes Mother    Dementia Mother    Hyperlipidemia Mother    Hypertension Mother    Irregular heart beat Mother    Diabetes Father    Hypertension Father    Hyperlipidemia Father    Down syndrome Sister    Diabetes Sister    Hyperlipidemia Brother    Heart Problems Brother    Goiter Maternal Aunt    Thyroid nodules Sister    Cancer Cousin 65       eye; maternal first cousin   Goiter Cousin    Cancer Paternal Aunt        unknown form of cancer   Cancer Cousin        unknown form of cancer; paternal first cousin   Pancreatic cancer Cousin 70       paternal first cousin   Cancer Cousin 39       unknown cancer; paternal first cousin   Cancer Cousin 38       unknown cancer; paternal first cousin    Social History:  reports that she has never smoked. She has never used smokeless tobacco. She reports current alcohol use. She reports that she does not use drugs.  REVIEW of systems:  She is being followed for her pancreatic tumor by oncology  She is being followed by her PCP for hypertension with lisinopril HCT 10/12 .5 mg daily   BP Readings from Last 3 Encounters:  11/15/20 126/80  10/30/20 120/88  10/26/20 (!)  147/87   Hypokalemia: Taking potassium supplements   Lab Results  Component Value Date   K 3.6 10/27/2020    Has had hypercholesterolemia, was taking Crestor 10 mg with following results  Labs as follows  Lab Results  Component Value Date   CHOL 162 07/31/2020   HDL 58 07/31/2020   LDLCALC 81 07/31/2020   LDLDIRECT 97.7 02/11/2014   TRIG 131 07/31/2020   CHOLHDL 2.8 07/31/2020      Examination:   Ht 5' 8.5" (1.74 m)   Wt 199 lb (90.3 kg)   LMP 09/17/2012   BMI 29.82 kg/m   Body  mass index is 29.82 kg/m.    Assessment/Plan:   Diabetes type 2, on insulin  See history of present illness for detailed discussion of her current management, blood sugar patterns and problems identified  Her A1c is usually over 8%    Last fructosamine is significantly high about a month ago  She still has mostly postprandial hyperglycemia but this is now variably after breakfast or lunch As above her overall average blood sugars are improving especially with taking additional premixed insulin at dinnertime However not getting enough coverage for breakfast or lunch and she is usually not wanting to take enough insulin to cover her meals even though she has readings as high as 350 postprandially Again she thinks that insulin makes her hungry and cause her to eat more carbohydrates  Weight may be slightly improved with increased exercise   Recommendations: 2/22 Again tried to convince her to try 5 mg Farxiga for at least a couple of weeks Discussed that this will reduce her need for insulin, facilitate weight loss, make her blood sugars more evenly controlled, provide long-term cardiovascular and kidney protection and potentially also reduce blood pressure medications She can adjust diet for couple of weeks and discussed that any small incidence of vaginal candidiasis can be treated easily and should not outweigh the benefits  Discussed needing to take Humalog with every meal that has  carbohydrates and increase the dose based on portions, emphasized that she will need to take some insulin with most meals even if she just eating beings She needs to have some protein with every meal including breakfast Also if she is exercising in the morning she can reduce the breakfast dose or skip a day especially if she is not drinking sweet tea at breakfast No change in Metformin Restart regular exercise Follow-up in 3 months for A1c    There are no Patient Instructions on file for this visit.  Total visit time including chart, data and collection of freestyle libre data as well as counseling = 30 minutes   Elayne Snare 12/01/2020, 11:19 AM   Note: This office note was prepared with Dragon voice recognition system technology. Any transcriptional errors that result from this process are unintentional.

## 2020-12-04 ENCOUNTER — Inpatient Hospital Stay: Payer: BC Managed Care – PPO | Attending: Genetic Counselor

## 2020-12-04 ENCOUNTER — Other Ambulatory Visit: Payer: Self-pay

## 2020-12-04 DIAGNOSIS — Z452 Encounter for adjustment and management of vascular access device: Secondary | ICD-10-CM | POA: Diagnosis not present

## 2020-12-04 DIAGNOSIS — Z95828 Presence of other vascular implants and grafts: Secondary | ICD-10-CM

## 2020-12-04 DIAGNOSIS — C250 Malignant neoplasm of head of pancreas: Secondary | ICD-10-CM | POA: Insufficient documentation

## 2020-12-04 MED ORDER — HEPARIN SOD (PORK) LOCK FLUSH 100 UNIT/ML IV SOLN
500.0000 [IU] | Freq: Once | INTRAVENOUS | Status: AC
  Administered 2020-12-04: 500 [IU]
  Filled 2020-12-04: qty 5

## 2020-12-04 MED ORDER — SODIUM CHLORIDE 0.9% FLUSH
10.0000 mL | Freq: Once | INTRAVENOUS | Status: AC
  Administered 2020-12-04: 10 mL
  Filled 2020-12-04: qty 10

## 2020-12-04 NOTE — Patient Instructions (Signed)
[place]  Discharge Instructions: Thank you for choosing [facility] to provide your oncology and hematology care.   If you have a lab appointment with the [facility], please go directly to the [facility] and check in at the registration area.   Wear comfortable clothing and clothing appropriate for easy access to any Portacath or PICC line.   We strive to give you quality time with your provider. You may need to reschedule your appointment if you arrive late (15 or more minutes).  Arriving late affects you and other patients whose appointments are after yours.  Also, if you miss three or more appointments without notifying the office, you may be dismissed from the clinic at the provider's discretion.      For prescription refill requests, have your pharmacy contact our office and allow 72 hours for refills to be completed.    Today you received the following chemotherapy and/or immunotherapy agents PAC flush      To help prevent nausea and vomiting after your treatment, we encourage you to take your nausea medication as directed.  BELOW ARE SYMPTOMS THAT SHOULD BE REPORTED IMMEDIATELY: *FEVER GREATER THAN 100.4 F (38 C) OR HIGHER *CHILLS OR SWEATING *NAUSEA AND VOMITING THAT IS NOT CONTROLLED WITH YOUR NAUSEA MEDICATION *UNUSUAL SHORTNESS OF BREATH *UNUSUAL BRUISING OR BLEEDING *URINARY PROBLEMS (pain or burning when urinating, or frequent urination) *BOWEL PROBLEMS (unusual diarrhea, constipation, pain near the anus) TENDERNESS IN MOUTH AND THROAT WITH OR WITHOUT PRESENCE OF ULCERS (sore throat, sores in mouth, or a toothache) UNUSUAL RASH, SWELLING OR PAIN  UNUSUAL VAGINAL DISCHARGE OR ITCHING   Items with * indicate a potential emergency and should be followed up as soon as possible or go to the Emergency Department if any problems should occur.  Please show the CHEMOTHERAPY ALERT CARD or IMMUNOTHERAPY ALERT CARD at check-in to the  Emergency Department and triage nurse.  Should you have questions after your visit or need to cancel or reschedule your appointment, please contact [facility]  Dept: [phone]  and follow the prompts.  Office hours are 8:00 a.m. to 4:30 p.m. Monday - Friday. Please note that voicemails left after 4:00 p.m. may not be returned until the following business day.  We are closed weekends and major holidays. You have access to a nurse at all times for urgent questions. Please call the main number to the clinic Dept: [phone] and follow the prompts.   For any non-urgent questions, you may also contact your provider using MyChart. We now offer e-Visits for anyone 17 and older to request care online for non-urgent symptoms. For details visit [email or web address].   Also download the MyChart app! Go to the app store, search "MyChart", open the app, select [facility], and log in with your MyChart username and password.  Due to Covid, a mask is required upon entering the hospital/clinic. If you do not have a mask, one will be given to you upon arrival. For doctor visits, patients may have 1 support person aged 51 or older with them. For treatment visits, patients cannot have anyone with them due to current Covid guidelines and our immunocompromised population.   Implanted [facility] Guide An implanted port is a device that is placed under the skin. It is usually placed in the chest. The device can be used to give IV medicine, to take blood, or for dialysis. You may have an implanted port if: You need IV  medicine that would be irritating to the small veins in your hands or arms. You need IV medicines, such as antibiotics, for a long period of time. You need IV nutrition for a long period of time. You need dialysis. When you have a port, your health care provider can choose to use the port instead of veins in your arms for these procedures. You may have fewer limitations when  using a port than you would if you used other types of long-term IVs, and you will likely be able to return to normal activities afteryour incision heals. An implanted port has two main parts: Reservoir. The reservoir is the part where a needle is inserted to give medicines or draw blood. The reservoir is round. After it is placed, it appears as a small, raised area under your skin. Catheter. The catheter is a thin, flexible tube that connects the reservoir to a vein. Medicine that is inserted into the reservoir goes into the catheter and then into the vein. How is my port accessed? To access your port: A numbing cream may be placed on the skin over the port site. Your health care provider will put on a mask and sterile gloves. The skin over your port will be cleaned carefully with a germ-killing soap and allowed to dry. Your health care provider will gently pinch the port and insert a needle into it. Your health care provider will check for a blood return to make sure the port is in the vein and is not clogged. If your port needs to remain accessed to get medicine continuously (constant infusion), your health care provider will place a clear bandage (dressing) over the needle site. The dressing and needle will need to be changed every week, or as told by your health care provider. What is flushing? Flushing helps keep the port from getting clogged. Follow instructions from your health care provider about how and when to flush the port. Ports are usually flushed with saline solution or a medicine called heparin. The need for flushing will depend on how the port is used: If the port is only used from time to time to give medicines or draw blood, the port may need to be flushed: Before and after medicines have been given. Before and after blood has been drawn. As part of routine maintenance. Flushing may be recommended every 4-6 weeks. If a constant infusion is running, the port may not need to be  flushed. Throw away any syringes in a disposal container that is meant for sharp items (sharps container). You can buy a sharps container from a pharmacy, or you can make one by using an empty hard plastic bottle with a cover. How long will my port stay implanted? The port can stay in for as long as your health care provider thinks it is needed. When it is time for the port to come out, a surgery will be done to remove it. The surgery will be similar to the procedure that was done to putthe port in. Follow these instructions at home:  Flush your port as told by your health care provider. If you need an infusion over several days, follow instructions from your health care provider about how to take care of your port site. Make sure you: Wash your hands with soap and water before you change your dressing. If soap and water are not available, use alcohol-based hand sanitizer. Change your dressing as told by your health care provider. Place any used dressings or infusion  bags into a plastic bag. Throw that bag in the trash. Keep the dressing that covers the needle clean and dry. Do not get it wet. Do not use scissors or sharp objects near the tube. Keep the tube clamped, unless it is being used. Check your port site every day for signs of infection. Check for: Redness, swelling, or pain. Fluid or blood. Pus or a bad smell. Protect the skin around the port site. Avoid wearing bra straps that rub or irritate the site. Protect the skin around your port from seat belts. Place a soft pad over your chest if needed. Bathe or shower as told by your health care provider. The site may get wet as long as you are not actively receiving an infusion. Return to your normal activities as told by your health care provider. Ask your health care provider what activities are safe for you. Carry a medical alert card or wear a medical alert bracelet at all times. This will let health care providers know that you have  an implanted port in case of an emergency. Get help right away if: You have redness, swelling, or pain at the port site. You have fluid or blood coming from your port site. You have pus or a bad smell coming from the port site. You have a fever. Summary Implanted ports are usually placed in the chest for long-term IV access. Follow instructions from your health care provider about flushing the port and changing bandages (dressings). Take care of the area around your port by avoiding clothing that puts pressure on the area, and by watching for signs of infection. Protect the skin around your port from seat belts. Place a soft pad over your chest if needed. Get help right away if you have a fever or you have redness, swelling, pain, drainage, or a bad smell at the port site. This information is not intended to replace advice given to you by your health care provider. Make sure you discuss any questions you have with your healthcare provider. Document Revised: 10/11/2019 Document Reviewed: 10/11/2019 Elsevier Patient Education  [place].

## 2020-12-07 ENCOUNTER — Other Ambulatory Visit: Payer: Self-pay | Admitting: Internal Medicine

## 2020-12-12 DIAGNOSIS — H2513 Age-related nuclear cataract, bilateral: Secondary | ICD-10-CM | POA: Diagnosis not present

## 2020-12-12 DIAGNOSIS — E119 Type 2 diabetes mellitus without complications: Secondary | ICD-10-CM | POA: Diagnosis not present

## 2020-12-12 DIAGNOSIS — K7689 Other specified diseases of liver: Secondary | ICD-10-CM | POA: Diagnosis not present

## 2020-12-12 LAB — HM DIABETES EYE EXAM

## 2020-12-12 NOTE — Telephone Encounter (Signed)
Hello Dr. Valeta Harms, please advise on mychart message. Thanks!  Good morning, Dr. Valeta Harms.  I have been notified by my insurance company, [employer], the the procedure I have scheduled with you on 12/19/20 has been denied.  Are you planning to appeal this decision and will my procedure likely be rescheduled?  Thank you for any information you can provide.

## 2020-12-12 NOTE — Telephone Encounter (Signed)
Will route to [facility] for reason.  Hey guys, can we find out why it was denied? Thanks so much!  Good morning, Dr. Valeta Harms.  I have been notified by my insurance company, [employer], the the procedure I have scheduled with you on 12/19/20 has been denied.  Are you planning to appeal this decision and will my procedure likely be rescheduled?  Thank you for any information you can provide.

## 2020-12-13 ENCOUNTER — Encounter: Payer: Self-pay | Admitting: Internal Medicine

## 2020-12-13 ENCOUNTER — Telehealth: Payer: Self-pay | Admitting: Pulmonary Disease

## 2020-12-13 NOTE — Telephone Encounter (Signed)
This office has not received a copy of the denial.  I have requested this be faxed to me directly w/ [employer].  Per [employer], we should receive it in 24-48 hours.  I have sent a message to Dr. Valeta Harms to let him know about the denial.

## 2020-12-14 ENCOUNTER — Ambulatory Visit: Payer: BC Managed Care – PPO | Admitting: Pulmonary Disease

## 2020-12-15 ENCOUNTER — Other Ambulatory Visit (HOSPITAL_COMMUNITY)
Admission: RE | Admit: 2020-12-15 | Discharge: 2020-12-15 | Disposition: A | Discharge status: Home / Self Care | Payer: BC Managed Care – PPO | Source: Ambulatory Visit | Attending: Pulmonary Disease | Admitting: Pulmonary Disease

## 2020-12-15 ENCOUNTER — Encounter (HOSPITAL_COMMUNITY): Payer: Self-pay | Admitting: Pulmonary Disease

## 2020-12-15 ENCOUNTER — Encounter (HOSPITAL_COMMUNITY): Payer: Self-pay | Admitting: Physician Assistant

## 2020-12-15 DIAGNOSIS — Z01812 Encounter for preprocedural laboratory examination: Secondary | ICD-10-CM | POA: Insufficient documentation

## 2020-12-15 DIAGNOSIS — Z20822 Contact with and (suspected) exposure to covid-19: Secondary | ICD-10-CM | POA: Insufficient documentation

## 2020-12-15 NOTE — Telephone Encounter (Signed)
Mychart message was sent by pt which is posted below: Robina Ade, Beverlyn Roux, DO 2 hours ago (10:51 AM)   Good morning, I am scheduled for a procedure on 12/19/20 that [place] has denied coverage for.  I have not been able to speak to anyone at your office to know if the decision has been/will be appealed and what the status is.  I'm assuming you have encountered this before and know how to get it approved, therefore, if I need to reschedule the procedure until this is sorted out, please let me know.  If possible, please let me know today where we are with this so I can properly plan.   Thank you for your attention to this. Kahlee     Routing to both Dr. Valeta Harms as well as PCCs as this is more info that pt sent [place] compared to the prior mychart message.

## 2020-12-15 NOTE — Progress Notes (Signed)
DUE TO COVID-19 ONLY ONE VISITOR IS ALLOWED TO COME WITH YOU AND STAY IN THE WAITING ROOM ONLY DURING PRE OP AND PROCEDURE DAY OF SURGERY.   PCP - Dr. Glendale Chard Cardiologist - n/a Internal Med - Dr Elayne Snare Oncology - Dr [name]  CT Chest - 10/24/20 EKG - 07/31/20 Stress Test - n/a ECHO - n/a Cardiac Cath - n/a  Sleep Study -  n/a CPAP - none  Fasting Blood Sugar 70s- 200s Checks Blood Sugar several times a day Patient has [employer], Type 2 Diabetic  Do not take metformin on the morning of surgery.  THE NIGHT BEFORE SURGERY, take 7 units Humalog Mix 75/25 Insulin.       THE MORNING OF SURGERY, do not take Humalog Mx 75/25 Insulin.  Do not take Insulin Lispro unless your CBG is greater than 220 mg/dL.  If CBG > 220, then you may take  of your sliding scale (correction) dose of insulin.  If your blood sugar is less than 70 mg/dL, you will need to treat for low blood sugar: Treat a low blood sugar (less than 70 mg/dL) with  cup of clear juice (cranberry or apple), 4 glucose tablets, OR glucose gel. Recheck blood sugar in 15 minutes after treatment (to make sure it is greater than 70 mg/dL). If your blood sugar is not greater than 70 mg/dL on recheck, call [phone] for further instructions.  Anesthesia review: Yes  STOP now taking any Aspirin (unless otherwise instructed by your surgeon), Aleve, Naproxen, Ibuprofen, Motrin, Advil, Goody's, BC's, all herbal medications, fish oil, and all vitamins.   Coronavirus Screening Covid test is scheduled on 12/15/20. Do you have any of the following symptoms:  Cough yes/no: No Fever (>100.27F)  yes/no: No Runny nose yes/no: No Sore throat yes/no: No Difficulty breathing/shortness of breath  yes/no: No  Have you traveled in the last 14 days and where? yes/no: No  Patient verbalized understanding of instructions that were given via phone.

## 2020-12-16 LAB — SARS CORONAVIRUS 2 (TAT 6-24 HRS): SARS Coronavirus 2: NEGATIVE

## 2020-12-18 ENCOUNTER — Telehealth: Payer: Self-pay | Admitting: Pulmonary Disease

## 2020-12-18 NOTE — Telephone Encounter (Signed)
31627-Predetermination for this patient was denied.  On 12/15/20, I spoke with Gae Bon w/ [place] who advised [phone] does not require a prior auth or a predetermination ref# [id number].  MD has been made aware.  Jackson Latino advised BI to contact pt's insurance for a reconsideration for the [phone].  Pt has been made aware.   LM on Veronica's VM regarding this procedure.

## 2020-12-18 NOTE — Telephone Encounter (Signed)
PCCM:  I called and spoke with patient's insurance company today via phone.  I spoke with a RN navigator in the claims office which read me a statement from a policy stating that all procedures performed under 31627 CPT code as electromagnetic navigational bronchoscopy were considered investigational per [employer].  And that this was based on current data.  I explained that I wished to see to see the current data that they claim.  She was unable to provide any of this "current data".  I explained by making the decision to deny this procedure for the patient would delay her care in determining whether or not she potentially has underlying metastatic pancreatic cancer.  She then placed me on hold transfer me to the manager RN within the claims department.  We reviewed once again that the insurance company was attempting to delay the patient's care for the most appropriate biopsy.  And that the only alternative for tissue sampling would be considerations for percutaneous needle biopsy which carries a higher morbidity and risk for complication.  She then stated that she would be able to put me in contact with the medical director who made the claim denial.  If you minutes later the medical director of the claims department called.  We discussed the case.  He once again read the policy.  He also stated he would not be reversing his decision. I asked for data regarding the policy and how that was determined.  He was unable to provide that to me.  Even though he stated that he had reviewed the literature.  When I asked for "the literature" he was unable to provide this for me. When I asked him about his understanding of navigational bronchoscopy and its use since he was unable to respond to these questions.  He constantly stated that he understood and heard me.  Toward the end of the conversation the medical director on the phone for the claims department became increasingly frustrated and  truthfully unprofessional.  He also stated that he "did not care" what I or my patient did.  I explained that kind of attitude was not patient centered. And that the insurance company at this time was denying services for the patient to receive the medical care that she needs and therefore delaying diagnosis.  After spending nearly an hour on the phone.  I called and spoke with the patient.  Unfortunately, we were left with the only alternative to appeal for an outside decision to be rendered.  This appeal will take approximately 72 hours with an expedited request.  I have called and spoke to the patient and informed them of what [place] claims department thinks about her care.  We discussed this in detail as above.  We will have to reschedule the patients case for tomorrow which will ultimately delay her care. I will let her primary oncologist know.   Garner Nash, DO [facility] 12/18/2020 5:35 PM

## 2020-12-19 ENCOUNTER — Ambulatory Visit (HOSPITAL_COMMUNITY)
Admission: RE | Admit: 2020-12-19 | Payer: BC Managed Care – PPO | Source: Home / Self Care | Admitting: Pulmonary Disease

## 2020-12-19 HISTORY — DX: Solitary pulmonary nodule: R91.1

## 2020-12-19 HISTORY — DX: Hyperlipidemia, unspecified: E78.5

## 2020-12-19 SURGERY — VIDEO BRONCHOSCOPY WITH ENDOBRONCHIAL NAVIGATION
Anesthesia: General | Laterality: Bilateral

## 2020-12-21 ENCOUNTER — Encounter: Payer: Self-pay | Admitting: Oncology

## 2020-12-21 ENCOUNTER — Other Ambulatory Visit: Payer: Self-pay | Admitting: Endocrinology

## 2021-01-01 ENCOUNTER — Telehealth: Payer: Self-pay

## 2021-01-01 NOTE — Telephone Encounter (Signed)
Lvm. Wanting to know if pt could come in earlier for booster.

## 2021-01-03 ENCOUNTER — Telehealth (INDEPENDENT_AMBULATORY_CARE_PROVIDER_SITE_OTHER): Payer: BC Managed Care – PPO | Admitting: Pulmonary Disease

## 2021-01-03 DIAGNOSIS — J984 Other disorders of lung: Secondary | ICD-10-CM

## 2021-01-03 DIAGNOSIS — R918 Other nonspecific abnormal finding of lung field: Secondary | ICD-10-CM

## 2021-01-03 DIAGNOSIS — D3A8 Other benign neuroendocrine tumors: Secondary | ICD-10-CM | POA: Diagnosis not present

## 2021-01-03 NOTE — Progress Notes (Signed)
Virtual Visit via Video Note  I connected with Alyssa Ross on 01/03/21 at  2:45 PM EDT by a video enabled telemedicine application and verified that I am speaking with the correct person using two identifiers.  Location: Patient: Home Provider: Office   I discussed the limitations of evaluation and management by telemedicine and the availability of in person appointments. The patient expressed understanding and agreed to proceed.  History of Present Illness:  This is a 55 year old female, past medical history of pancreatic neuroendocrine tumor, diabetes, hypertension.  Patient is followed by Dr. Benay Spice from medical oncology, office note from 10/26/2020 reviewed.  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  In November 2020 patient developed multiple bilateral pulmonary nodules some of which increased in size developed cavitation.  CT scan of the chest in January 2021 showed stable size of the bilateral pulmonary nodules also areas with cavitation.  Receiving chemotherapy at the time.  Patient had subsequent follow-up disease in February 2022 and May 2022 CT scan imaging with increased size of the pulmonary nodules.  Patient's imaging was reviewed at GI tumor board.  Felt that if potential spread of disease through the chest would be an unusual pattern however recommended pulmonary evaluation for consideration of biopsy.  Case was discussed with medical oncology regarding next steps. FMHx sister with hashimotos thyroid disease, sister with ?RA, uncle with RA.  OV 01/03/2021: Presents today for video visit.  We were able to see each other and communicate fine via video visit today in the office.  We reviewed her updates regarding approval for her bronchoscopy.  We will plan for a repeat appeal to have bronchoscopy approved.  She still has  bilateral cavitary lesions within the chest.  Her lab work we reviewed today on the phone as well.  It does not appear to me that were dealing with a vasculitis because that her lab values were relatively normal.  Observations/Objective:  Patient was able to communicate in complete sentences.  Looks in no distress on video.  Assessment and Plan: History of pancreatic cancer Multiple bilateral cavitary nodules within the chest Plan: The most appropriate option for tissue biopsy that is the safest for the patient is navigational bronchoscopy.  The other alternatives are not acceptable options as she has bilateral disease and would need biopsies of both lungs.  Biopsies with a percutaneous CT-guided biopsy is unobtainable for bilateral disease.  There is an additional increased risk of pneumothorax for percutaneous CT-guided biopsies over the use of bronchoscopy.  We discussed this today in the office.  Follow Up Instructions:  I discussed the assessment and treatment plan with the patient. The patient was provided an opportunity to ask questions and all were answered. The patient agreed with the plan and demonstrated an understanding of the instructions.   The patient was advised to call back or seek an in-person evaluation if the symptoms worsen or if the condition fails to improve as anticipated.  Patient's video visit was completed using my CMA's computer during the office visit today as we already had the live stream up and running.  Garner Nash, DO

## 2021-01-04 DIAGNOSIS — R918 Other nonspecific abnormal finding of lung field: Secondary | ICD-10-CM | POA: Diagnosis present

## 2021-01-09 ENCOUNTER — Other Ambulatory Visit: Payer: Self-pay

## 2021-01-09 ENCOUNTER — Ambulatory Visit (HOSPITAL_BASED_OUTPATIENT_CLINIC_OR_DEPARTMENT_OTHER)
Admission: RE | Admit: 2021-01-09 | Discharge: 2021-01-09 | Disposition: A | Discharge status: Home / Self Care | Payer: BC Managed Care – PPO | Source: Ambulatory Visit | Attending: Oncology | Admitting: Oncology

## 2021-01-09 DIAGNOSIS — I700 Atherosclerosis of aorta: Secondary | ICD-10-CM | POA: Diagnosis not present

## 2021-01-09 DIAGNOSIS — R918 Other nonspecific abnormal finding of lung field: Secondary | ICD-10-CM | POA: Diagnosis not present

## 2021-01-09 DIAGNOSIS — C250 Malignant neoplasm of head of pancreas: Secondary | ICD-10-CM | POA: Insufficient documentation

## 2021-01-09 DIAGNOSIS — R911 Solitary pulmonary nodule: Secondary | ICD-10-CM | POA: Diagnosis not present

## 2021-01-09 IMAGING — CT CT CHEST W/O CM
2 of 3 series · 15 of 36 positions shown, 18 images · non-contrast
Comparison: 01/29/2019 and 03/03/2019

CLINICAL DATA: History of pancreatic cancer, follow-up lung
nodules.

EXAM:
CT CHEST WITHOUT CONTRAST
TECHNIQUE: Multidetector CT imaging of the chest was performed following the
standard protocol without IV contrast.

[Series 2: thorax · axial · 0.82mm/px · z∈[-259,-29]mm · 12 of 136 slices shown, 15 images]
[im 11/136  mediastinal]
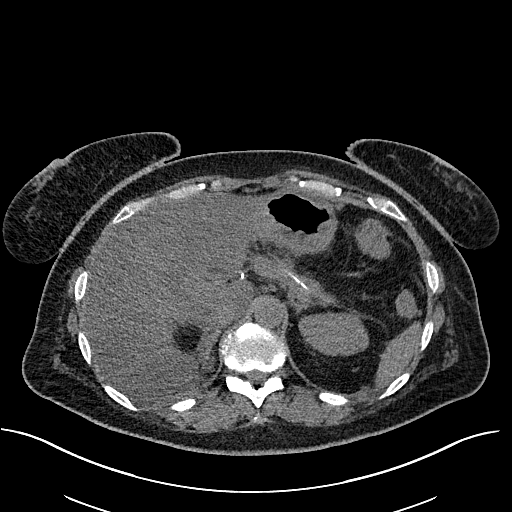
[im 11/136  lung]
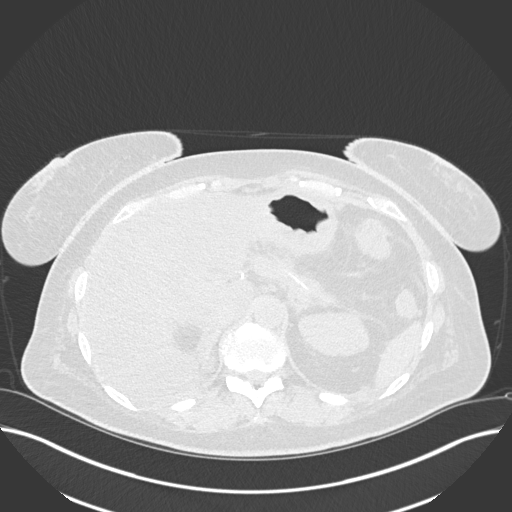
[im 21/136  lung]
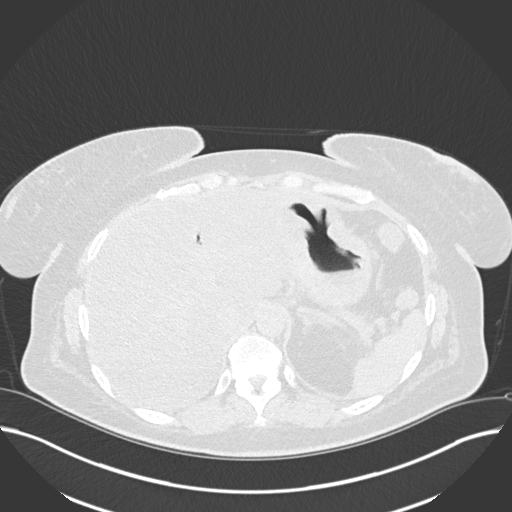
[im 31/136  lung]
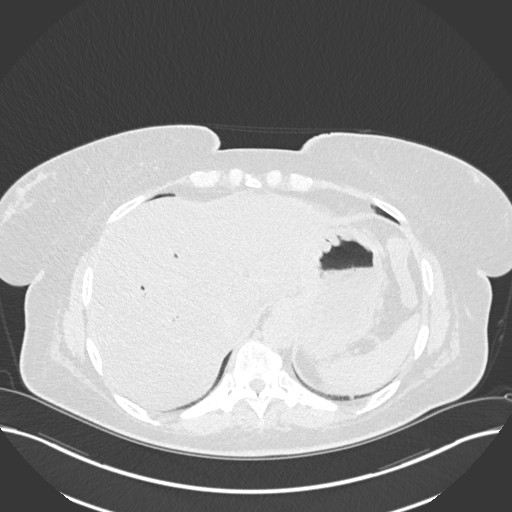
[im 41/136  lung]
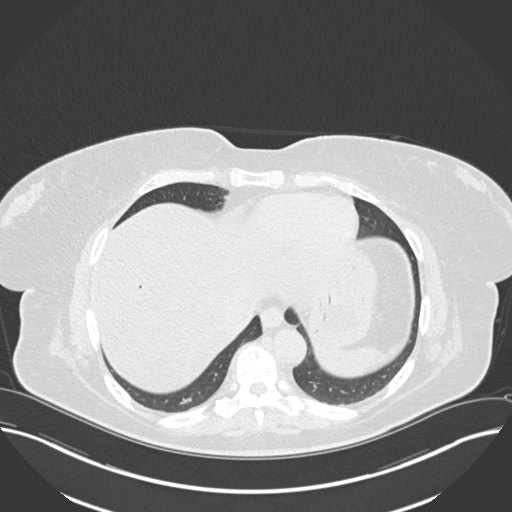
[im 51/136  mediastinal]
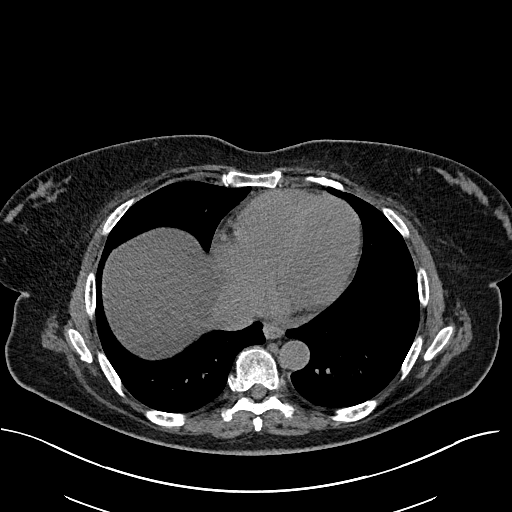
[im 51/136  lung]
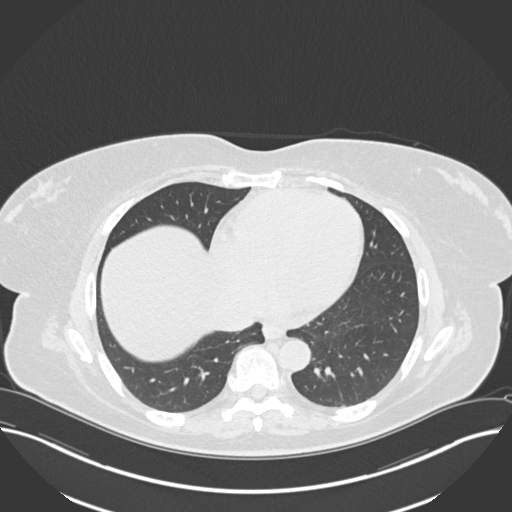
[im 61/136  lung]
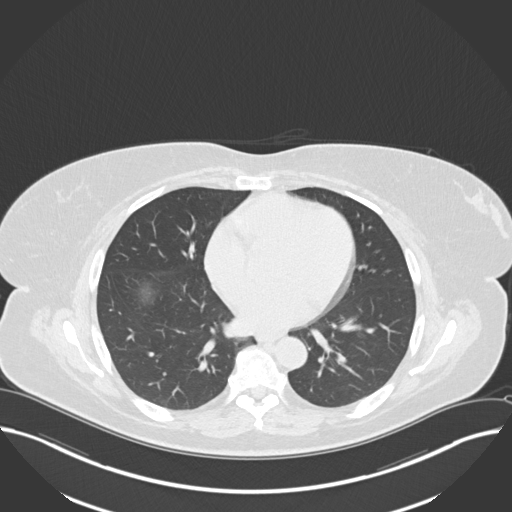
[im 76/136  lung]
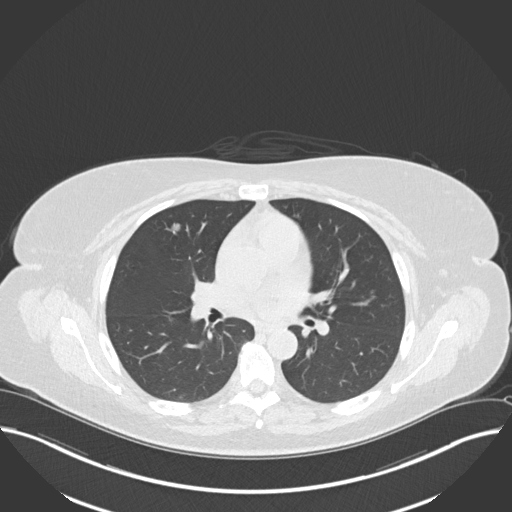
[im 86/136  lung]
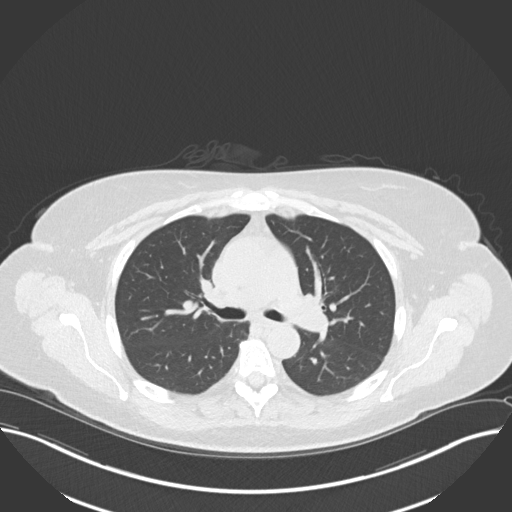
[im 96/136  mediastinal]
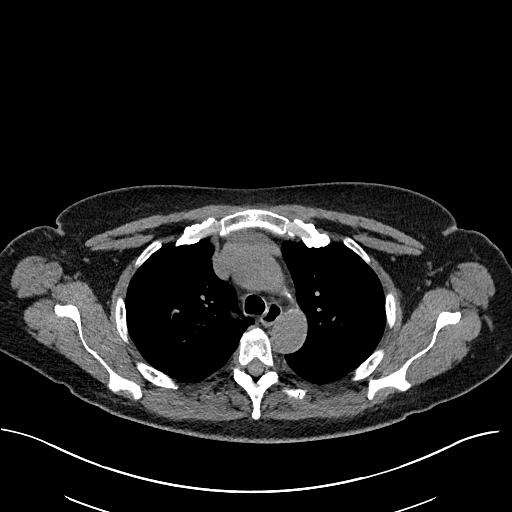
[im 96/136  lung]
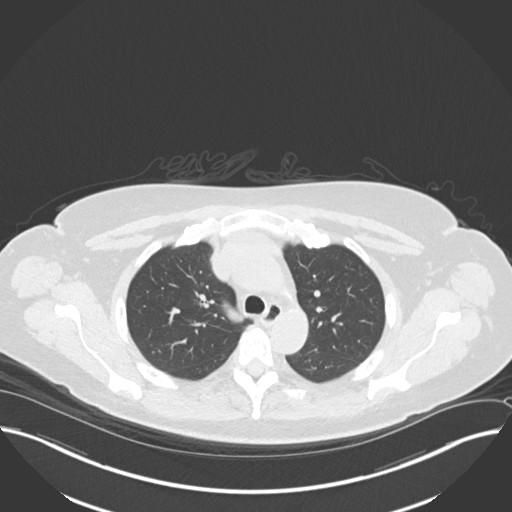
[im 106/136  lung]
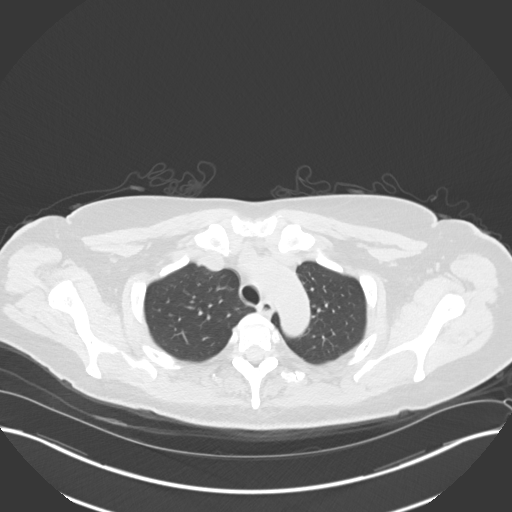
[im 116/136  lung]
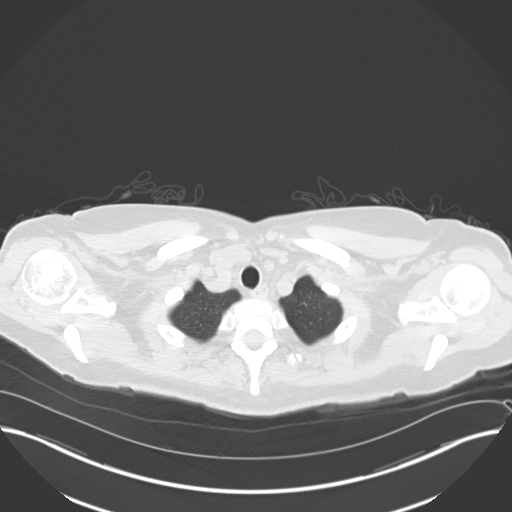
[im 126/136  lung]
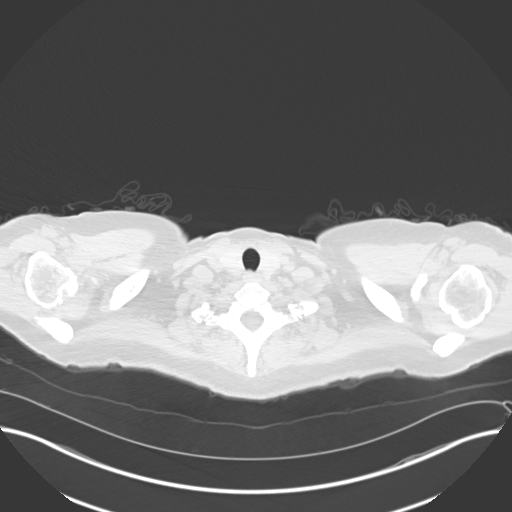

[Series 6: coronal · coronal · 0.55mm/px · 3 of 121 slices shown]
[im 25/121  lung]
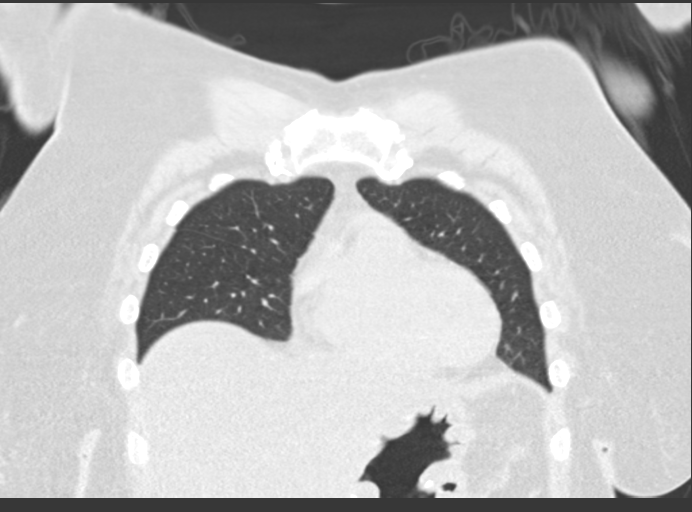
[im 49/121  lung]
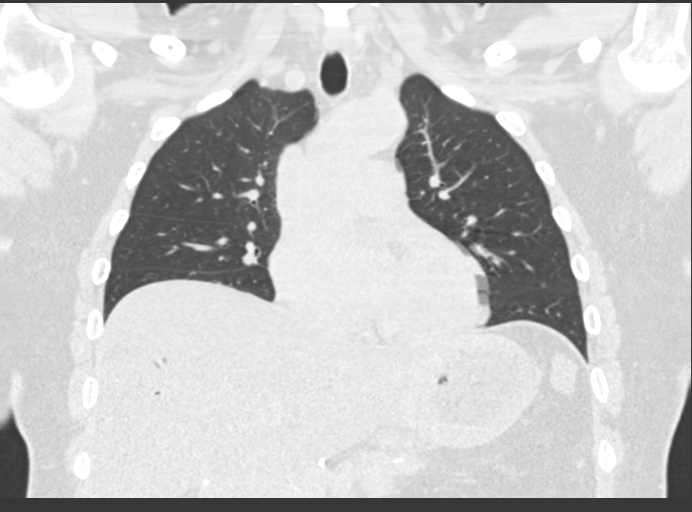
[im 73/121  lung]
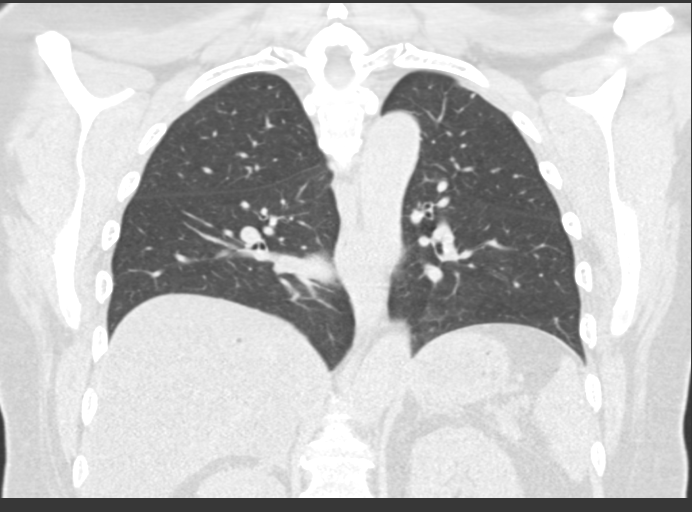

[15 of 36 positions shown; findings below may reference images not displayed]

FINDINGS: Cardiovascular: Scattered atherosclerosis. No signs of aneurysm.
Heart size normal. No pericardial effusion. Central pulmonary
vasculature is normal.

Mediastinum/Nodes: No signs of adenopathy in the chest.

Lungs/Pleura: Pulmonary nodules as outlined in previous study of
01/29/2019

(Image 62, series [DATE] x 9 mm pulmonary nodule in the right upper
lobe shows increased cavitation, stable in size.

(Image is 58, series 5): Left lower lobe pulmonary nodule also with
some increased cavitation but with stable size at approximately 8-9
mm.

(Image 79,): Stable appearance of 4 mm right lower lobe pulmonary
nodule. Is

Two tiny nodules in the left upper lobe 1 showing cavitation (image
29, series 5) is also with stable nodule just at the left lung apex
measuring approximately 5 mm (image 19)

No signs of consolidation or evidence of pleural effusion.

Upper Abdomen: Severe hepatic steatosis and pneumobilia following
Whipple procedure. Stranding the operative bed is nonspecific.
Stable benign appearing right adrenal nodule partially imaged. No
signs of free air.

Musculoskeletal: No signs of acute bone finding or evidence of
destructive bone process.
IMPRESSION: 1. Multiple bilateral pulmonary nodules, some of which demonstrate
increased cavitation, are stable in size, remaining suspicious for
metastatic disease.
2. No other interval changes.
3. Severe hepatic steatosis and pneumobilia following Whipple
procedure.
4. Stable benign appearing right adrenal nodule.

Aortic Atherosclerosis ([id number]).

## 2021-01-11 ENCOUNTER — Other Ambulatory Visit: Payer: Self-pay

## 2021-01-11 ENCOUNTER — Inpatient Hospital Stay: Payer: BC Managed Care – PPO | Attending: Genetic Counselor | Admitting: Oncology

## 2021-01-11 ENCOUNTER — Inpatient Hospital Stay: Payer: BC Managed Care – PPO

## 2021-01-11 VITALS — BP 131/88 | HR 75 | Temp 97.7°F | Resp 18 | Ht 68.0 in | Wt 196.6 lb

## 2021-01-11 DIAGNOSIS — Z452 Encounter for adjustment and management of vascular access device: Secondary | ICD-10-CM | POA: Insufficient documentation

## 2021-01-11 DIAGNOSIS — R918 Other nonspecific abnormal finding of lung field: Secondary | ICD-10-CM | POA: Insufficient documentation

## 2021-01-11 DIAGNOSIS — I10 Essential (primary) hypertension: Secondary | ICD-10-CM | POA: Diagnosis not present

## 2021-01-11 DIAGNOSIS — E1140 Type 2 diabetes mellitus with diabetic neuropathy, unspecified: Secondary | ICD-10-CM | POA: Diagnosis not present

## 2021-01-11 DIAGNOSIS — C250 Malignant neoplasm of head of pancreas: Secondary | ICD-10-CM

## 2021-01-11 DIAGNOSIS — Z95828 Presence of other vascular implants and grafts: Secondary | ICD-10-CM

## 2021-01-11 MED ORDER — HEPARIN SOD (PORK) LOCK FLUSH 100 UNIT/ML IV SOLN
500.0000 [IU] | Freq: Once | INTRAVENOUS | Status: AC
  Administered 2021-01-11: 500 [IU]
  Filled 2021-01-11: qty 5

## 2021-01-11 MED ORDER — SODIUM CHLORIDE 0.9% FLUSH
10.0000 mL | Freq: Once | INTRAVENOUS | Status: AC
Start: 2021-01-11 — End: 2021-01-11
  Administered 2021-01-11: 10 mL
  Filled 2021-01-11: qty 10

## 2021-01-11 NOTE — Progress Notes (Signed)
[facility] OFFICE PROGRESS NOTE   Diagnosis: Pancreas cancer  INTERVAL HISTORY:   Alyssa Ross returns as scheduled.  She feels well.  She is seeing Dr. Valeta Harms for evaluation of pulmonary nodules.  She reports the insurance has not approved a navigation bronchoscopy.  She is scheduled for bronchoscopy on 01/30/2021.  No cough or dyspnea.  Objective:  Vital signs in last 24 hours:  Blood pressure 131/88, pulse 75, temperature 97.7 F (36.5 C), temperature source Oral, resp. rate 18, height _0  (1.727 m), weight 196 lb 9.6 oz (89.2 kg), last menstrual period 09/17/2012, SpO2 100 %. Lymphatics: No cervical, supraclavicular, axillary, or inguinal nodes Resp: Lungs clear bilaterally Cardio: Regular rate and rhythm GI: No hepatosplenomegaly Vascular: No leg edema  Portacath/PICC-without erythema  Lab Results:  Lab Results  Component Value Date   WBC 4.6 07/31/2020   HGB 11.8 07/31/2020   HCT 36.8 07/31/2020   MCV 95 07/31/2020   PLT 340 07/31/2020   NEUTROABS 2.4 05/26/2020    CMP  Lab Results  Component Value Date   NA 141 10/27/2020   K 3.6 10/27/2020   CL 103 10/27/2020   CO2 28 10/27/2020   GLUCOSE 216 (H) 10/27/2020   BUN 7 10/27/2020   CREATININE 1.08 10/27/2020   CALCIUM 9.7 10/27/2020   PROT 6.6 10/24/2020   ALBUMIN 3.8 10/24/2020   AST 37 10/24/2020   ALT 40 10/24/2020   ALKPHOS 104 10/24/2020   BILITOT 0.6 10/24/2020   GFRNONAA 54 (L) 10/24/2020   GFRAA >60 02/07/2020    Lab Results  Component Value Date   HRC163 22 10/24/2020    Lab Results  Component Value Date   INR 1.1 05/26/2020   LABPROT 13.5 05/26/2020    Imaging:  CT CHEST WO CONTRAST  Result Date: 01/09/2021 CLINICAL DATA:  Restaging of pancreatic neoplasm, follow-up pulmonary nodules in the setting of pancreatic cancer. 55 year old female. EXAM: CT CHEST WITHOUT CONTRAST TECHNIQUE: Multidetector CT imaging of the chest was performed following the standard protocol  without IV contrast. COMPARISON:  Oct 24, 2020. FINDINGS: Cardiovascular: Calcified atheromatous plaque in the thoracic aorta. Normal caliber. Normal caliber central pulmonary vasculature. Normal heart size. No substantial pericardial effusion. LEFT-sided Port-A-Cath terminates at the caval to atrial junction. Limited assessment of cardiovascular structures given lack of intravenous contrast. Mediastinum/Nodes: No thoracic inlet adenopathy. No axillary lymphadenopathy. No hilar adenopathy. No mediastinal adenopathy. Esophagus grossly normal. Lungs/Pleura: Multiple pulmonary nodules, some with cavitation showing similar distribution to previous imaging. LEFT upper lobe pulmonary nodule (image 61/5) 7 mm previously 5 mm. LEFT lower lobe pulmonary nodule (image 63/5) this is partially cavitated and measures 19 mm, previously 17 mm. RIGHT lower lobe pulmonary nodule (image 93/5) 13 mm greatest axial dimension almost entirely cavitary on the current study and the prior study previously approximately 11 mm. RIGHT upper lobe pulmonary nodule (image 65/5) 18 mm greatest axial dimension, previously approximately 17 mm. RIGHT upper lobe pulmonary nodule (image 37/5) 8 mm greatest axial dimension, previously 7 mm. Number of nodules appears similar with multiple nodules scattered throughout the chest Airways are patent. No sign of consolidative change or evidence of pleural effusion. Upper Abdomen: Signs of hepatic steatosis. Post Whipple procedure with evidence of pneumobilia. Stent remains in the pancreas. Similar appearance of upper abdominal structures with stable low-density RIGHT adrenal nodule as well which well well-circumscribed is indeterminate based on density but unchanged. No acute upper abdominal findings. Musculoskeletal: No acute musculoskeletal process. Spinal degenerative changes. IMPRESSION: 1. Multiple  pulmonary nodules, some with cavitation, showing similar distribution to previous imaging. Slight interval  enlargement since previous imaging. Post Whipple procedure in the upper abdomen as before. Incompletely assessed. 2. Stable indeterminate RIGHT adrenal nodule. 3. Aortic atherosclerosis. Aortic Atherosclerosis (ICD10-I70.0). Electronically Signed   By: Zetta Bills M.D.   On: 01/09/2021 16:07    Medications: I have reviewed the patient's current medications.   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 Tumor invades the duodenal wall and vascular groove, resection margins negative, 4/34 lymph nodes positive MSI-stable, tumor showed instability in 2 loci as did adjacent normal tissue EUS FNA biopsy of pancreas mass on 07/03/2018-well-differentiated neuroendocrine tumor CTs 01/29/2019-ill-defined pancreas head mass, 5 pulmonary nodules-1 with a small amount of central cavitation, tumor abuts the left margin of the portal vein indistinct density surrounding, hepatic artery, complex cystic lesion of the right kidney, right adrenal mass-characterized as an adenoma on a [facility] MRI 12/21/2018 [facility] 03/03/2019-no focal pancreas activity, no tracer accumulation within the suspicious pulmonary nodules, left uterine mass with tracer accumulation felt to represent a leiomyoma Elevated preoperative CA 19-9--CA 19-9 186 on 01/18/2019 CT chest 04/16/2019-multiple bilateral pulmonary nodules, some with increased cavitation, stable in size Cycle 1 FOLFIRINOX 04/27/2019 Cycle 2 FOLFIRINOX 05/11/2019 Cycle 3 FOLFIRINOX 05/23/2019 Cycle 4 FOLFIRINOX 06/08/2019 Cycle 5 FOLFIRINOX 06/22/2019 CT chest 07/02/2019-stable size of bilateral pulmonary nodules.  Dominant cavitary lesions in both lungs show increased cavitation with thinner walls.  Stable 2.1 cm right adrenal nodule. Cycle 6 FOLFIRINOX 07/06/2019 Cycle 7 FOLFIRINOX 07/21/2019 Cycle 8 FOLFIRINOX 08/03/2019, oxaliplatin deleted secondary to neuropathy CT chest 08/24/2019-decreased size of several lung  nodules with resolution of a left upper lobe nodule, no new nodules Radiation to the pancreas surgical area with concurrent Xeloda 09/13/2019-10/20/2019  CTs 11/29/2019-multiple small pulmonary nodules scattered throughout the lungs bilaterally, appear increased in number and size. No definite evidence of metastatic disease in the abdomen or pelvis. Markedly enlarged and heterogeneous appearing uterus, likely to represent multifocal fibroids. 1 of these lesions appears to be an exophytic subserosal fibroid in the posterior lateral aspect of the uterine body on the left side although this comes in close proximity to the left adnexa such that a primary ovarian lesion is difficult to completely exclude. CTs 02/07/2020-slight enlargement of bilateral lung nodules, some are cavitary, no evidence of metastatic disease in the abdomen or pelvis, stable right adrenal nodule, uterine fibroids CTs 04/26/2020-mild enlargement of pulmonary nodules, slight increase in trace pelvic fluid, new soft tissue thickening inferior to the cecal tip suspicious for peritoneal metastasis CT 05/26/2020-improved appearance of soft tissue at the inferior tip of the cecum, mildly thickened short appendix-findings suggestive of resolving appendicitis, stable small bibasilar pulmonary nodules, fibroids Plan biopsy of right cecal tip soft tissue canceled secondary to radiologic improvement CT chest 08/02/2020-enlargement and progressive cavitation of multiple bilateral lung nodules.  Some new nodules are present. 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 Ehrhard appears unchanged.  The lung nodules are slightly larger.  She is scheduled for bronchoscopy with Dr. Valeta Harms later this month.  She will return for an office visit after the bronchoscopy to review the pathology and discussed treatment options.  [name], MD  01/11/2021  9:33 AM

## 2021-01-14 IMAGING — DX DG CHEST 1V PORT
1 series · 1 of 1 positions shown · non-contrast
Comparison: None.

CLINICAL DATA: Status post Port-A-Cath placement.

EXAM:
PORTABLE CHEST 1 VIEW

[chest ap]
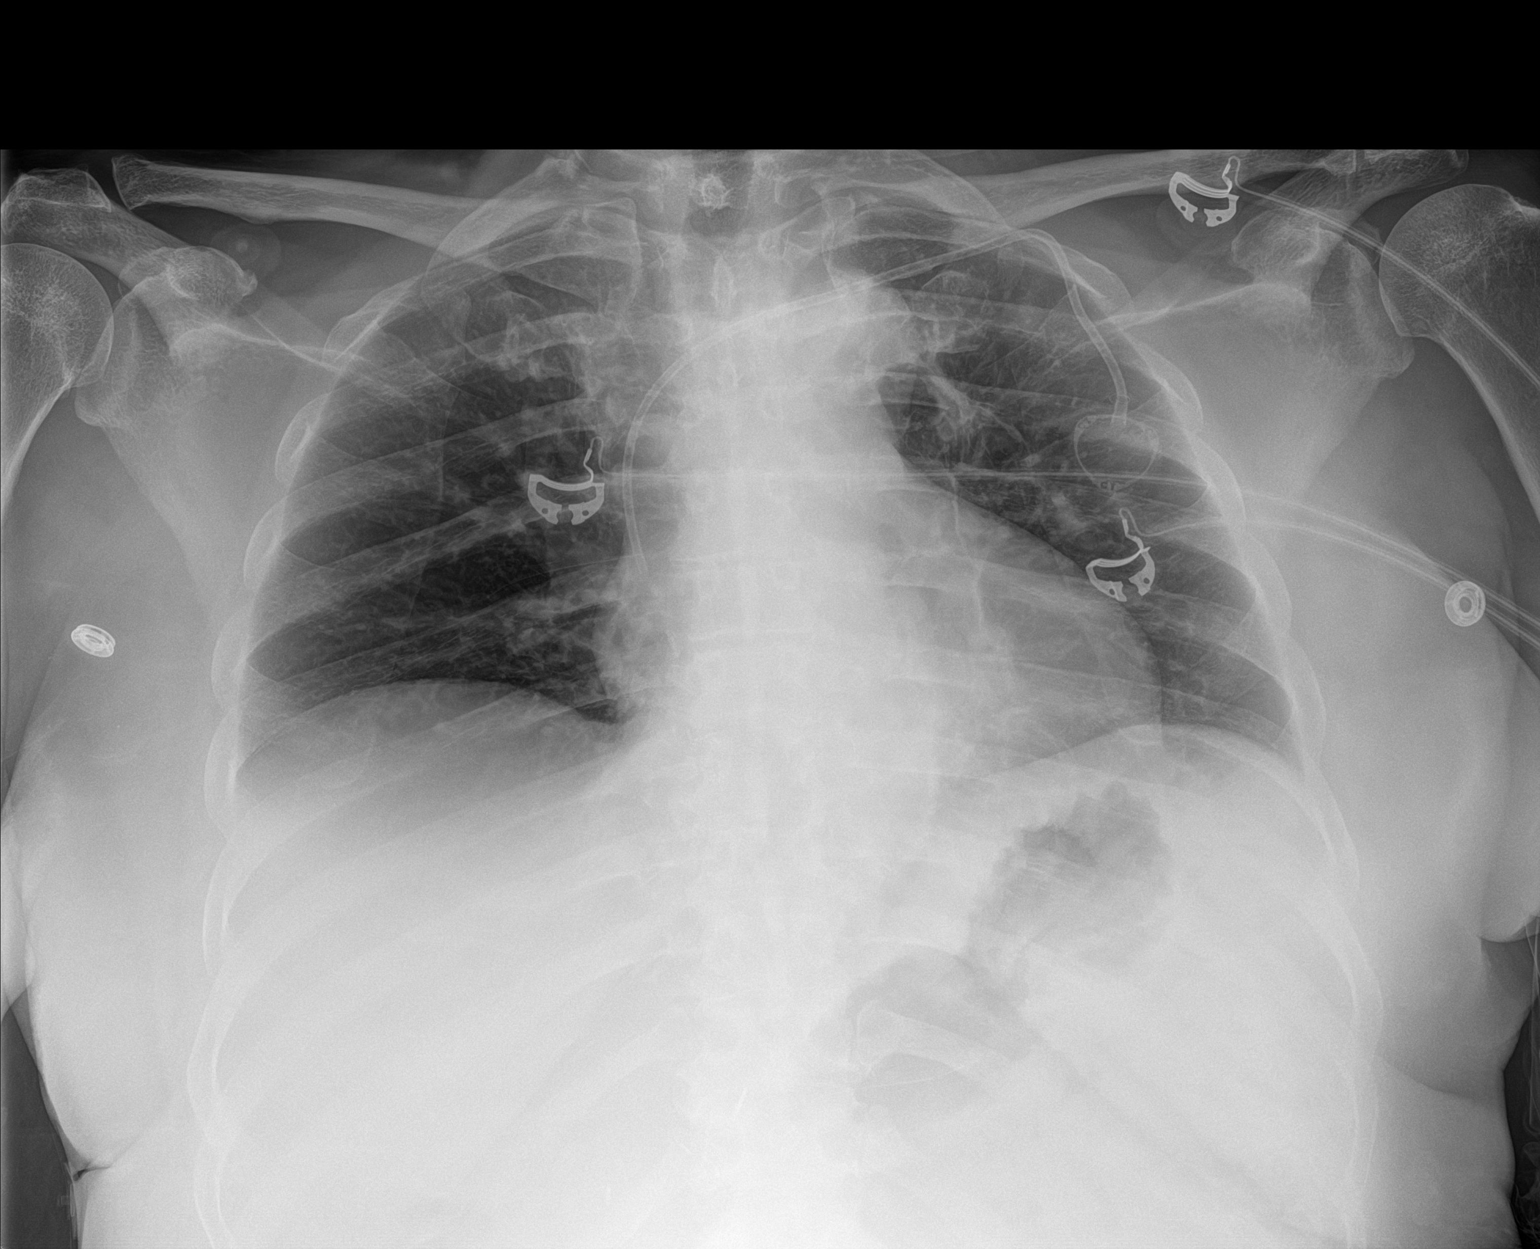

[1 of 1 positions shown; findings below may reference images not displayed]

FINDINGS: The heart size and mediastinal contours are within normal limits.
Both lungs are clear. Left subclavian Port-A-Cath is noted with
distal tip in expected position of the SVC. No pneumothorax or
pleural effusion is noted. The visualized skeletal structures are
unremarkable.
IMPRESSION: Left subclavian Port-A-Cath is noted with distal tip in expected
position of the SVC. No acute cardiopulmonary abnormality seen.

## 2021-01-16 DIAGNOSIS — E119 Type 2 diabetes mellitus without complications: Secondary | ICD-10-CM | POA: Diagnosis not present

## 2021-01-16 DIAGNOSIS — Z794 Long term (current) use of insulin: Secondary | ICD-10-CM | POA: Diagnosis not present

## 2021-01-16 NOTE — Telephone Encounter (Signed)
Bronch has been scheduled for 8/23. Can this encounter be closed? Thanks.

## 2021-01-22 LAB — HEMOGLOBIN A1C: Hemoglobin A1C: 7.8

## 2021-01-23 ENCOUNTER — Encounter: Payer: Self-pay | Admitting: Internal Medicine

## 2021-01-23 DIAGNOSIS — H35413 Lattice degeneration of retina, bilateral: Secondary | ICD-10-CM | POA: Diagnosis not present

## 2021-01-23 DIAGNOSIS — H43391 Other vitreous opacities, right eye: Secondary | ICD-10-CM | POA: Diagnosis not present

## 2021-01-23 DIAGNOSIS — K769 Liver disease, unspecified: Secondary | ICD-10-CM | POA: Diagnosis not present

## 2021-01-23 DIAGNOSIS — H2513 Age-related nuclear cataract, bilateral: Secondary | ICD-10-CM | POA: Diagnosis not present

## 2021-01-23 DIAGNOSIS — H43813 Vitreous degeneration, bilateral: Secondary | ICD-10-CM | POA: Diagnosis not present

## 2021-01-23 DIAGNOSIS — H33311 Horseshoe tear of retina without detachment, right eye: Secondary | ICD-10-CM | POA: Diagnosis not present

## 2021-01-23 DIAGNOSIS — E119 Type 2 diabetes mellitus without complications: Secondary | ICD-10-CM | POA: Diagnosis not present

## 2021-01-24 ENCOUNTER — Telehealth: Payer: Self-pay | Admitting: Pulmonary Disease

## 2021-01-24 NOTE — Telephone Encounter (Signed)
Called and spoke with patient. She stated that she needed to get transportation setup for her bronch later on this month. I advised her that since it was after 5pm and the transportation office was closed, we would need to call back tomorrow. She verbalized understanding.   Will keep in triage to follow up on tomorrow.

## 2021-01-26 ENCOUNTER — Encounter (HOSPITAL_COMMUNITY): Payer: Self-pay | Admitting: Pulmonary Disease

## 2021-01-26 NOTE — Progress Notes (Signed)
DUE TO COVID-19 ONLY ONE VISITOR IS ALLOWED TO COME WITH YOU AND STAY IN THE WAITING ROOM ONLY DURING PRE OP AND PROCEDURE DAY OF SURGERY.   PCP - Dr Glendale Chard Cardiologist - n/a Endocrinology - Dr Elayne Snare Oncology - Dr Synetta Shadow Pulmonology - Dr Leory Plowman Icard  CT Chest x-ray - 10/24/20 EKG - 07/31/20 Stress Test - n/a ECHO - n/a Cardiac Cath - n/a  Sleep Study -  n/a CPAP - none  Fasting Blood Sugar - 120s Checks Blood Sugar 5 times a day Freestyle Libre System  Do not take [place] on the morning of surgery.  THE NIGHT BEFORE SURGERY, take 7 units of your Humalog Insulin.      THE MORNING OF SURGERY, do not take any Humalog Insulin.    If your blood sugar is less than 70 mg/dL, you will need to treat for low blood sugar: Treat a low blood sugar (less than 70 mg/dL) with  cup of clear juice (cranberry or apple), 4 glucose tablets, OR glucose gel. Recheck blood sugar in 15 minutes after treatment (to make sure it is greater than 70 mg/dL). If your blood sugar is not greater than 70 mg/dL on recheck, call [phone] for further instructions.  Anesthesia review: Yes  STOP now taking any Aspirin (unless otherwise instructed by your surgeon), Aleve, Naproxen, Ibuprofen, Motrin, Advil, Goody's, BC's, all herbal medications, fish oil, and all vitamins.   Coronavirus Screening Covid test n/a - Ambulatory Surgery  Do you have any of the following symptoms:  Cough yes/no: No Fever (>100.56F)  yes/no: No Runny nose yes/no: No Sore throat yes/no: No Difficulty breathing/shortness of breath  yes/no: No  Have you traveled in the last 14 days and where? yes/no: No  Patient verbalized understanding of instructions that were given via phone.

## 2021-01-29 NOTE — Progress Notes (Signed)
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  She has history of pancreatic cancer.  Recent imaging shows multiple pulmonary nodules.  She was referred to pulmonology for further evaluation and tissue diagnosis.  History includes never smoker, post-operative N/V, DM2, HTN, pancreatic cancer (s/p surgery 03/04/19; chemoradiation), anemia, HLD. Diagnostic laparoscopy, classic pancreaticoduodenectomy, pllacement of pancreatic duct stent with open right radical partial nephrectomoy 03/04/19 (pathology: ductal adenocarcinoma, + 4/21 LN; right parital nephrectomy speciment consistent with cystic nephroma). Port-a-cath 04/21/19.  She is for COVID-19 testing on arrival.  Anesthesia team to evaluate on the day of surgery.  VS: Ht 5\' 8"  (1.727 m)   Wt 89.2 kg   LMP 09/17/2012   BMI 29.90 kg/m  BP Readings from Last 3 Encounters:  01/11/21 131/88  11/15/20 126/80  10/30/20 120/88   Pulse Readings from Last 3 Encounters:  01/11/21 75  11/15/20 99  10/30/20 79     PROVIDERS: Glendale Chard, MD is PCP  Endocrinology - Dr Elayne Snare Oncology - Dr Synetta Shadow Pulmonology - Dr June Leap   LABS: For day of surgery as indicated. As of 10/27/20 C1 1.08, on 07/31/20 H/H 11.8/36.8, PLT 340. A1c 8.0% 07/25/20.    IMAGES: CT Chest 01/09/21: IMPRESSION: 1. Multiple pulmonary nodules, some with cavitation, showing similar distribution to previous imaging. Slight interval enlargement since previous imaging. Post  Whipple procedure in the upper abdomen as before. Incompletely assessed. 2. Stable indeterminate RIGHT adrenal nodule. 3. Aortic atherosclerosis.    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   Lung nodule    pancreatic ca dx'd 11/2018   Neuroendocrine Tumor of the pancreas   PONV (postoperative nausea and vomiting)    nausea  vomitting after 12/25/18 ERCP   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   Yeast vaginitis 2006    Past Surgical History:  Procedure Laterality Date   BILIARY STENT PLACEMENT N/A 12/25/2018   Procedure: BILIARY STENT PLACEMENT;  Surgeon: Carol Ada, MD;  Location: [facility];  Service: Endoscopy;  Laterality: N/A;   BILIARY STENT PLACEMENT N/A 01/01/2019   Procedure: BILIARY STENT PLACEMENT;  Surgeon: Carol Ada, MD;  Location: [facility];  Service: Endoscopy;  Laterality: N/A;   DILATATION & CURETTAGE/HYSTEROSCOPY WITH TRUECLEAR N/A 01/20/2014   Procedure: DILATATION & CURETTAGE/HYSTEROSCOPY WITH TRUCLEAR;  Surgeon: [name], MD;  Location: [facility];  Service: Gynecology;  Laterality: N/A;   DILATATION & CURRETTAGE/HYSTEROSCOPY WITH RESECTOCOPE N/A 01/20/2014   Procedure: DILATATION & CURETTAGE/HYSTEROSCOPY WITH RESECTOCOPE;  Surgeon: Gwynneth Munson  Duncan Dull, MD;  Location: [facility];  Service: Gynecology;  Laterality: N/A;   ENDOSCOPIC RETROGRADE CHOLANGIOPANCREATOGRAPHY (ERCP) WITH PROPOFOL N/A 12/25/2018   Procedure: ENDOSCOPIC RETROGRADE CHOLANGIOPANCREATOGRAPHY (ERCP) WITH PROPOFOL;  Surgeon: Carol Ada, MD;  Location: [facility];  Service: Endoscopy;  Laterality: N/A;   ENDOSCOPIC RETROGRADE CHOLANGIOPANCREATOGRAPHY (ERCP) WITH PROPOFOL N/A 01/01/2019   Procedure: ENDOSCOPIC RETROGRADE CHOLANGIOPANCREATOGRAPHY (ERCP) WITH PROPOFOL;  Surgeon: Carol Ada, MD;  Location: [facility];  Service: Endoscopy;  Laterality: N/A;   ERCP  12/25/2018   FINE NEEDLE ASPIRATION N/A 01/01/2019   Procedure: FINE NEEDLE ASPIRATION (FNA) LINEAR;  Surgeon: Carol Ada, MD;  Location: [facility];  Service: Endoscopy;  Laterality: N/A;   LAPAROSCOPY N/A 03/04/2019   Procedure: LAPAROSCOPY DIAGNOSTIC;  Surgeon: Stark Klein, MD;  Location: [facility];  Service: General;  Laterality: N/A;  GENERAL AND EPIDURAL   PARTIAL NEPHRECTOMY Right 03/04/2019   Procedure: Nephrectomy Partial;  Surgeon: Cleon Gustin, MD;  Location: [facility];  Service: Urology;  Laterality: Right;   PORTACATH PLACEMENT Left 04/21/2019   Procedure: INSERTION PORT-A-CATH;  Surgeon: Stark Klein, MD;  Location: [facility];  Service: General;  Laterality: Left;   SPHINCTEROTOMY  12/25/2018   Procedure: Joan Mayans;  Surgeon: Carol Ada, MD;  Location: [facility];  Service: Endoscopy;;   STENT REMOVAL  01/01/2019   Procedure: STENT REMOVAL;  Surgeon: Carol Ada, MD;  Location: [facility];  Service: Endoscopy;;   UPPER ESOPHAGEAL ENDOSCOPIC ULTRASOUND (EUS) N/A 01/01/2019   Procedure: UPPER ESOPHAGEAL ENDOSCOPIC ULTRASOUND (EUS);  Surgeon: Carol Ada, MD;  Location: [facility];  Service: Endoscopy;  Laterality: N/A;   WHIPPLE PROCEDURE N/A 03/04/2019   Procedure: WHIPPLE PROCEDURE;  Surgeon: Stark Klein, MD;  Location: [facility];  Service: General;  Laterality: N/A;  GENERAL AND EPIDURAL    MEDICATIONS: No current facility-administered medications for this encounter.    HUMALOG MIX 75/25 KWIKPEN (75-25) 100 UNIT/ML Kwikpen   insulin lispro (HUMALOG) 100 UNIT/ML KwikPen   KLOR-CON M20 20 MEQ tablet   lidocaine-prilocaine (EMLA) cream    lisinopril-hydrochlorothiazide (ZESTORETIC) 10-12.5 MG tablet   loratadine (CLARITIN) 10 MG tablet   MAGNESIUM-OXIDE 400 (240 Mg) MG tablet   metFORMIN (GLUCOPHAGE-XR) 500 MG 24 hr tablet   rosuvastatin (CRESTOR) 10 MG tablet   Continuous Blood Gluc Receiver (FREESTYLE LIBRE 14 DAY READER) DEVI   Continuous Blood Gluc Sensor (FREESTYLE LIBRE 2 SENSOR) MISC   dapagliflozin propanediol (FARXIGA) 5 MG TABS tablet   glucose blood test strip   Insulin Pen Needle (NOVOFINE PLUS PEN NEEDLE) 32G X 4 MM MISC    sodium chloride flush (NS) 0.9 % injection 10 mL    Myra Gianotti, PA-C Surgical Short Stay/Anesthesiology [facility] Phone [phone] [facility] Phone [phone] 01/29/2021 4:30 PM

## 2021-01-29 NOTE — Anesthesia Preprocedure Evaluation (Addendum)
Anesthesia Evaluation  Patient identified by MRN, date of birth, ID band Patient awake    Airway Mallampati: II  TM Distance: >3 FB     Dental   Pulmonary    breath sounds clear to auscultation       Cardiovascular hypertension,  Rhythm:Regular Rate:Normal     Neuro/Psych    GI/Hepatic Neg liver ROS,   Endo/Other  diabetes  Renal/GU      Musculoskeletal   Abdominal   Peds  Hematology   Anesthesia Other Findings   Reproductive/Obstetrics                            Anesthesia Physical Anesthesia Plan  ASA: 3  Anesthesia Plan: General   Post-op Pain Management:    Induction:   PONV Risk Score and Plan:   Airway Management Planned: Oral ETT  Additional Equipment:   Intra-op Plan:   Post-operative Plan: Extubation in OR  Informed Consent: I have reviewed the patients History and Physical, chart, labs and discussed the procedure including the risks, benefits and alternatives for the proposed anesthesia with the patient or authorized representative who has indicated his/her understanding and acceptance.       Plan Discussed with: CRNA  Anesthesia Plan Comments: (PAT note written 01/29/2021 by Myra Gianotti, PA-C. )       Anesthesia Quick Evaluation

## 2021-01-30 ENCOUNTER — Ambulatory Visit (HOSPITAL_COMMUNITY): Payer: BC Managed Care – PPO

## 2021-01-30 ENCOUNTER — Ambulatory Visit (HOSPITAL_COMMUNITY): Payer: BC Managed Care – PPO | Admitting: Vascular Surgery

## 2021-01-30 ENCOUNTER — Ambulatory Visit (HOSPITAL_COMMUNITY)
Admission: RE | Admit: 2021-01-30 | Discharge: 2021-01-30 | Disposition: A | Discharge status: Home / Self Care | Payer: BC Managed Care – PPO | Attending: Pulmonary Disease | Admitting: Pulmonary Disease

## 2021-01-30 ENCOUNTER — Encounter (HOSPITAL_COMMUNITY): Payer: Self-pay | Admitting: Pulmonary Disease

## 2021-01-30 ENCOUNTER — Encounter (HOSPITAL_COMMUNITY)
Admission: RE | Disposition: A | Discharge status: Home / Self Care | Payer: Self-pay | Source: Home / Self Care | Attending: Pulmonary Disease

## 2021-01-30 ENCOUNTER — Ambulatory Visit: Payer: BC Managed Care – PPO

## 2021-01-30 DIAGNOSIS — I10 Essential (primary) hypertension: Secondary | ICD-10-CM | POA: Insufficient documentation

## 2021-01-30 DIAGNOSIS — D649 Anemia, unspecified: Secondary | ICD-10-CM | POA: Diagnosis not present

## 2021-01-30 DIAGNOSIS — Z794 Long term (current) use of insulin: Secondary | ICD-10-CM | POA: Insufficient documentation

## 2021-01-30 DIAGNOSIS — R918 Other nonspecific abnormal finding of lung field: Secondary | ICD-10-CM | POA: Diagnosis present

## 2021-01-30 DIAGNOSIS — Z9889 Other specified postprocedural states: Secondary | ICD-10-CM

## 2021-01-30 DIAGNOSIS — R911 Solitary pulmonary nodule: Secondary | ICD-10-CM | POA: Diagnosis not present

## 2021-01-30 DIAGNOSIS — C3432 Malignant neoplasm of lower lobe, left bronchus or lung: Secondary | ICD-10-CM | POA: Insufficient documentation

## 2021-01-30 DIAGNOSIS — Z79899 Other long term (current) drug therapy: Secondary | ICD-10-CM | POA: Diagnosis not present

## 2021-01-30 DIAGNOSIS — J984 Other disorders of lung: Secondary | ICD-10-CM | POA: Diagnosis not present

## 2021-01-30 DIAGNOSIS — Z8507 Personal history of malignant neoplasm of pancreas: Secondary | ICD-10-CM | POA: Insufficient documentation

## 2021-01-30 DIAGNOSIS — Z419 Encounter for procedure for purposes other than remedying health state, unspecified: Secondary | ICD-10-CM

## 2021-01-30 DIAGNOSIS — D3A8 Other benign neuroendocrine tumors: Secondary | ICD-10-CM | POA: Diagnosis not present

## 2021-01-30 DIAGNOSIS — E119 Type 2 diabetes mellitus without complications: Secondary | ICD-10-CM | POA: Insufficient documentation

## 2021-01-30 DIAGNOSIS — E7800 Pure hypercholesterolemia, unspecified: Secondary | ICD-10-CM | POA: Diagnosis not present

## 2021-01-30 DIAGNOSIS — Z20822 Contact with and (suspected) exposure to covid-19: Secondary | ICD-10-CM | POA: Diagnosis not present

## 2021-01-30 DIAGNOSIS — C3411 Malignant neoplasm of upper lobe, right bronchus or lung: Secondary | ICD-10-CM | POA: Diagnosis not present

## 2021-01-30 HISTORY — PX: BRONCHIAL NEEDLE ASPIRATION BIOPSY: SHX5106

## 2021-01-30 HISTORY — PX: BRONCHIAL BRUSHINGS: SHX5108

## 2021-01-30 HISTORY — PX: VIDEO BRONCHOSCOPY WITH ENDOBRONCHIAL NAVIGATION: SHX6175

## 2021-01-30 HISTORY — PX: BRONCHIAL BIOPSY: SHX5109

## 2021-01-30 HISTORY — PX: VIDEO BRONCHOSCOPY WITH RADIAL ENDOBRONCHIAL ULTRASOUND: SHX6849

## 2021-01-30 HISTORY — PX: BRONCHIAL WASHINGS: SHX5105

## 2021-01-30 LAB — CBC
HCT: 34.9 % — ABNORMAL LOW (ref 36.0–46.0)
Hemoglobin: 11.1 g/dL — ABNORMAL LOW (ref 12.0–15.0)
MCH: 30.7 pg (ref 26.0–34.0)
MCHC: 31.8 g/dL (ref 30.0–36.0)
MCV: 96.4 fL (ref 80.0–100.0)
Platelets: 248 10*3/uL (ref 150–400)
RBC: 3.62 MIL/uL — ABNORMAL LOW (ref 3.87–5.11)
RDW: 12.1 % (ref 11.5–15.5)
WBC: 4.4 10*3/uL (ref 4.0–10.5)
nRBC: 0 % (ref 0.0–0.2)

## 2021-01-30 LAB — BASIC METABOLIC PANEL
Anion gap: 8 (ref 5–15)
BUN: 7 mg/dL (ref 6–20)
CO2: 24 mmol/L (ref 22–32)
Calcium: 9.2 mg/dL (ref 8.9–10.3)
Chloride: 107 mmol/L (ref 98–111)
Creatinine, Ser: 1.11 mg/dL — ABNORMAL HIGH (ref 0.44–1.00)
GFR, Estimated: 59 mL/min — ABNORMAL LOW (ref >60)
Glucose, Bld: 229 mg/dL — ABNORMAL HIGH (ref 70–99)
Potassium: 3.8 mmol/L (ref 3.5–5.1)
Sodium: 139 mmol/L (ref 135–145)

## 2021-01-30 LAB — GLUCOSE, CAPILLARY
Glucose-Capillary: 184 mg/dL — ABNORMAL HIGH (ref 70–99)
Glucose-Capillary: 174 mg/dL — ABNORMAL HIGH (ref 70–99)

## 2021-01-30 LAB — SARS CORONAVIRUS 2 BY RT PCR (HOSPITAL ORDER, PERFORMED IN ~~LOC~~ HOSPITAL LAB): SARS Coronavirus 2: NEGATIVE

## 2021-01-30 SURGERY — VIDEO BRONCHOSCOPY WITH ENDOBRONCHIAL NAVIGATION
Anesthesia: General | Laterality: Bilateral

## 2021-01-30 MED ORDER — CHLORHEXIDINE GLUCONATE 0.12 % MT SOLN
15.0000 mL | OROMUCOSAL | Status: AC
  Filled 2021-01-30: qty 15

## 2021-01-30 MED ORDER — CHLORHEXIDINE GLUCONATE 0.12 % MT SOLN
OROMUCOSAL | Status: AC
  Administered 2021-01-30: 15 mL via OROMUCOSAL
  Filled 2021-01-30: qty 15

## 2021-01-30 MED ORDER — LIDOCAINE 2% (20 MG/ML) 5 ML SYRINGE
INTRAMUSCULAR | Status: DC | PRN
  Administered 2021-01-30: 60 mg via INTRAVENOUS

## 2021-01-30 MED ORDER — FENTANYL CITRATE (PF) 100 MCG/2ML IJ SOLN
INTRAMUSCULAR | Status: DC | PRN
  Administered 2021-01-30 (×2): 50 ug via INTRAVENOUS

## 2021-01-30 MED ORDER — MIDAZOLAM HCL 5 MG/5ML IJ SOLN
INTRAMUSCULAR | Status: DC | PRN
  Administered 2021-01-30: 2 mg via INTRAVENOUS

## 2021-01-30 MED ORDER — DEXAMETHASONE SODIUM PHOSPHATE 10 MG/ML IJ SOLN
INTRAMUSCULAR | Status: DC | PRN
  Administered 2021-01-30: 5 mg via INTRAVENOUS

## 2021-01-30 MED ORDER — ONDANSETRON HCL 4 MG/2ML IJ SOLN
INTRAMUSCULAR | Status: DC | PRN
  Administered 2021-01-30: 4 mg via INTRAVENOUS

## 2021-01-30 MED ORDER — LACTATED RINGERS IV SOLN: INTRAVENOUS | Status: DC

## 2021-01-30 MED ORDER — PHENYLEPHRINE HCL (PRESSORS) 10 MG/ML IV SOLN
INTRAVENOUS | Status: DC | PRN
  Administered 2021-01-30: 80 ug via INTRAVENOUS
  Administered 2021-01-30: 40 ug via INTRAVENOUS
  Administered 2021-01-30 (×3): 80 ug via INTRAVENOUS

## 2021-01-30 MED ORDER — ESMOLOL HCL 100 MG/10ML IV SOLN
INTRAVENOUS | Status: DC | PRN
  Administered 2021-01-30: 40 mg via INTRAVENOUS

## 2021-01-30 MED ORDER — PROPOFOL 10 MG/ML IV BOLUS
INTRAVENOUS | Status: DC | PRN
  Administered 2021-01-30: 40 mg via INTRAVENOUS
  Administered 2021-01-30: 140 mg via INTRAVENOUS

## 2021-01-30 MED ORDER — ROCURONIUM BROMIDE 10 MG/ML (PF) SYRINGE
PREFILLED_SYRINGE | INTRAVENOUS | Status: DC | PRN
  Administered 2021-01-30: 10 mg via INTRAVENOUS
  Administered 2021-01-30: 50 mg via INTRAVENOUS
  Administered 2021-01-30: 20 mg via INTRAVENOUS

## 2021-01-30 MED ORDER — SUGAMMADEX SODIUM 200 MG/2ML IV SOLN
INTRAVENOUS | Status: DC | PRN
  Administered 2021-01-30: 200 mg via INTRAVENOUS

## 2021-01-30 SURGICAL SUPPLY — 46 items

## 2021-01-30 NOTE — Anesthesia Postprocedure Evaluation (Signed)
Anesthesia Post Note  Patient: Alida Greiner  Procedure(s) Performed: VIDEO BRONCHOSCOPY WITH ENDOBRONCHIAL NAVIGATION (Bilateral) RADIAL ENDOBRONCHIAL ULTRASOUND BRONCHIAL BIOPSIES BRONCHIAL BRUSHINGS BRONCHIAL NEEDLE ASPIRATION BIOPSIES BRONCHIAL WASHINGS     Patient location during evaluation: PACU Anesthesia Type: General Level of consciousness: awake Pain management: pain level controlled Vital Signs Assessment: post-procedure vital signs reviewed and stable Respiratory status: spontaneous breathing Cardiovascular status: stable Postop Assessment: no apparent nausea or vomiting Anesthetic complications: no   No notable events documented.  Last Vitals:  Vitals:   01/30/21 1338 01/30/21 1345  BP: (!) 150/91 (!) 146/87  Pulse: 100   Resp: (!) 21   Temp: (!) 36.3 C   SpO2: 93%     Last Pain:  Vitals:   01/30/21 1345  TempSrc:   PainSc: 0-No pain                 Sharon Rubis

## 2021-01-30 NOTE — Interval H&P Note (Signed)
History and Physical Interval Note:  01/30/2021 10:07 AM  Alyssa Ross  has presented today for surgery, with the diagnosis of bilateral lung nodules.  The various methods of treatment have been discussed with the patient and family. After consideration of risks, benefits and other options for treatment, the patient has consented to  Procedure(s) with comments: [place] (Bilateral) - ION as a surgical intervention.  The patient's history has been reviewed, patient examined, no change in status, stable for surgery.  I have reviewed the patient's chart and labs.  Questions were answered to the patient's satisfaction.     [place]

## 2021-01-30 NOTE — Op Note (Signed)
Video Bronchoscopy with Robotic Assisted Bronchoscopic Navigation   Date of Operation: 01/30/2021   Pre-op Diagnosis: Bilateral cavitary lung nodules  Post-op Diagnosis: Bilateral cavitary lung nodule  Surgeon: Garner Nash, DO  Assistants: None  Anesthesia: General endotracheal anesthesia  Operation: Flexible video fiberoptic bronchoscopy with robotic assistance and biopsies.  Estimated Blood Loss: Minimal  Complications: None  Indications and History: Alyssa Ross is a 55 y.o. female with history of bilateral cavitary lung nodules, history of pancreatic cancer. The risks, benefits, complications, treatment options and expected outcomes were discussed with the patient.  The possibilities of pneumothorax, pneumonia, reaction to medication, pulmonary aspiration, perforation of a viscus, bleeding, failure to diagnose a condition and creating a complication requiring transfusion or operation were discussed with the patient who freely signed the consent.    Description of Procedure: The patient was seen in the Preoperative Area, was examined and was deemed appropriate to proceed.  The patient was taken to [name] endoscopy room 3, identified as [name] and the procedure verified as Flexible Video Fiberoptic Bronchoscopy.  A Time Out was held and the above information confirmed.   Prior to the date of the procedure a high-resolution CT scan of the chest was performed. Utilizing ION software program a virtual tracheobronchial tree was generated to allow the creation of distinct navigation pathways to the patient's parenchymal abnormalities. After being taken to the operating room general anesthesia was initiated and the patient  was orally intubated. The video fiberoptic bronchoscope was introduced via the endotracheal tube and a general inspection was performed which showed normal right and left lung anatomy, aspiration of the bilateral mainstems was completed to remove any remaining secretions.  Robotic catheter inserted into patient's endotracheal tube.   Target #1 left lower lobe cavitary lung nodule: The distinct navigation pathways prepared prior to this procedure were then utilized to navigate to patient's lesion identified on CT scan. The robotic catheter was secured into place and the vision probe was withdrawn.  Lesion location was approximated using fluoroscopy and radial endobronchial ultrasound for peripheral targeting. Under fluoroscopic guidance transbronchial needle brushings, transbronchial needle biopsies, and transbronchial forceps biopsies were performed to be sent for cytology and pathology.  Additional transbronchial biopsies and needle samples were taken for tissue culture.  A bronchioalveolar lavage was performed in the left lower lobe and sent for cytology.  Target #2 right upper lobe cavitary lung nodule: The distinct navigation pathways prepared prior to this procedure were then utilized to navigate to patient's lesion identified on CT scan. The robotic catheter was secured into place and the vision probe was withdrawn.  Lesion location was approximated using fluoroscopy and radial endobronchial ultrasound for peripheral targeting. Under fluoroscopic guidance transbronchial needle brushings, transbronchial needle biopsies, and transbronchial forceps biopsies were performed to be sent for cytology and pathology.  Additional transbronchial biopsies and needle samples were taken for tissue culture.  A bronchioalveolar lavage was performed in the right upper lobe and sent for cytology.  The standard therapeutic bronchoscope was reinserted to the airway and aspiration of the bilateral mainstem's was necessary for removal of any remaining blood clots and debris.  All distal subsegments were patent at the termination of the procedure.  At the end of the procedure a general airway inspection was performed and there was no evidence of active bleeding. The bronchoscope was removed.   The patient tolerated the procedure well. There was no significant blood loss and there were no obvious complications. A post-procedural chest x-ray is pending.  Samples  Target #1: 1. Transbronchial needle brushings from left lower lobe 2. Transbronchial Wang needle biopsies from left lower lobe 3. Transbronchial forceps biopsies from left lower lobe 4. Bronchoalveolar lavage from left lower lobe  Samples Target #2: 1. Transbronchial needle brushings from right upper lobe 2. Transbronchial Wang needle biopsies from right upper lobe 3. Transbronchial forceps biopsies from right upper lobe 4. Bronchoalveolar lavage from right upper lobe  Plans:  The patient will be discharged from the PACU to home when recovered from anesthesia and after chest x-ray is reviewed. We will review the cytology, pathology and microbiology results with the patient when they become available. Outpatient followup will be with Leory Plowman [name] DO.    [facility] 01/30/2021 1:55 PM

## 2021-01-30 NOTE — Anesthesia Procedure Notes (Signed)
Procedure Name: Intubation Date/Time: 01/30/2021 11:53 AM Performed by: Reeves Dam, CRNA Pre-anesthesia Checklist: Patient identified, Patient being monitored, Timeout performed, Emergency Drugs available and Suction available Patient Re-evaluated:Patient Re-evaluated prior to induction Oxygen Delivery Method: Circle system utilized Preoxygenation: Pre-oxygenation with 100% oxygen Induction Type: IV induction Ventilation: Mask ventilation without difficulty Laryngoscope Size: 3 and Miller Grade View: Grade I Tube type: Oral Tube size: 8.5 mm Number of attempts: 1 Airway Equipment and Method: Stylet Placement Confirmation: ETT inserted through vocal cords under direct vision, positive ETCO2 and breath sounds checked- equal and bilateral Secured at: 21 cm Tube secured with: Tape Dental Injury: Teeth and Oropharynx as per pre-operative assessment

## 2021-01-30 NOTE — H&P (Signed)
Synopsis: Referred in June 2022 for abnormal CT chest, PCP: By Owens Shark, NP   Subjective:    PATIENT ID: Alyssa Ross GENDER: female DOB: 1965/06/19, MRN: [id number]       Chief Complaint  Patient presents with   Consult      No complaints      This is a 55 year old female, past medical history of pancreatic neuroendocrine tumor, diabetes, hypertension.  Patient is followed by Dr. Benay Spice from medical oncology, office note from 10/26/2020 reviewed.  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  In November 2020 patient developed multiple bilateral pulmonary nodules some of which increased in size developed cavitation.  CT scan of the chest in January 2021 showed stable size of the bilateral pulmonary nodules also areas with cavitation.  Receiving chemotherapy at the time.  Patient had subsequent follow-up disease in February 2022 and May 2022 CT scan imaging with increased size of the pulmonary nodules.  Patient's imaging was reviewed at GI tumor board.  Felt that if potential spread of disease through the chest would be an unusual pattern however recommended pulmonary evaluation for consideration of biopsy.  Case was discussed with medical oncology regarding next steps. FMHx sister with hashimotos thyroid disease, sister with ?RA, uncle with RA.   OV 01/30/2021: Here today for bronchoscopy. She is agreeable to proceed.         Oncology History  Pancreatic cancer ([place])  03/04/2019 Initial Diagnosis    Pancreatic cancer ([place])    03/30/2019 Cancer Staging    Staging form: Exocrine Pancreas, AJCC 8th Edition - Pathologic: Stage III (pT3, pN2, cM0) - Signed by Ladell Pier, MD on 03/30/2019    04/27/2019 -  Chemotherapy    The patient had palonosetron (ALOXI) injection 0.25 mg, 0.25 mg, Intravenous,  Once, 8 of 9  cycles Administration: 0.25 mg (04/27/2019), 0.25 mg (05/11/2019), 0.25 mg (05/25/2019), 0.25 mg (06/08/2019), 0.25 mg (06/22/2019), 0.25 mg (07/06/2019), 0.25 mg (07/21/2019), 0.25 mg (08/03/2019) irinotecan (CAMPTOSAR) 300 mg in dextrose 5 % 500 mL chemo infusion, 150 mg/m2 = 300 mg (100 % of original dose 150 mg/m2), Intravenous,  Once, 8 of 9 cycles Dose modification: 150 mg/m2 (original dose 150 mg/m2, Cycle 1, Reason: Provider Judgment) Administration: 300 mg (04/27/2019), 300 mg (05/11/2019), 300 mg (05/25/2019), 300 mg (06/08/2019), 300 mg (06/22/2019), 300 mg (07/06/2019), 300 mg (07/21/2019), 300 mg (08/03/2019) leucovorin 850 mg in dextrose 5 % 250 mL infusion, 824 mg, Intravenous,  Once, 8 of 9 cycles Administration: 850 mg (04/27/2019), 824 mg (05/11/2019), 824 mg (05/25/2019), 824 mg (06/08/2019), 824 mg (06/22/2019), 824 mg (07/06/2019), 824 mg (07/21/2019) oxaliplatin (ELOXATIN) 175 mg in dextrose 5 % 500 mL chemo infusion, 85 mg/m2 = 175 mg, Intravenous,  Once, 7 of 8 cycles Administration: 175 mg (04/27/2019), 175 mg (05/11/2019), 175 mg (05/25/2019), 175 mg (06/08/2019), 175 mg (06/22/2019), 175 mg (07/06/2019), 175 mg (07/21/2019) fosaprepitant (EMEND) 150 mg, dexamethasone (DECADRON) 12 mg in sodium chloride 0.9 % 145 mL IVPB, , Intravenous,  Once, 8 of 9 cycles Administration:  (04/27/2019),  (05/11/2019),  (05/25/2019),  (06/08/2019),  (06/22/2019),  (07/06/2019),  (07/21/2019),  (08/03/2019) fluorouracil (ADRUCIL) 4,100 mg in sodium chloride 0.9 % 68 mL chemo infusion, 2,000 mg/m2 = 4,100 mg (100 % of original dose 2,000 mg/m2), Intravenous, 1 Day/Dose, 8 of 9 cycles Dose modification: 2,000 mg/m2 (original dose 2,000 mg/m2,  Cycle 1, Reason: Provider Judgment) Administration: 4,100 mg (04/27/2019), 4,100 mg (05/11/2019), 4,100 mg (05/25/2019), 4,100 mg (06/08/2019), 4,100 mg (06/22/2019), 4,100 mg (07/06/2019), 4,100 mg (07/21/2019), 4,100 mg (08/03/2019)  for chemotherapy treatment.                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   Yeast vaginitis 2006           Family History  Problem Relation Age of Onset   Diabetes Mother     Dementia Mother     Hyperlipidemia Mother     Hypertension Mother     Irregular heart beat Mother     Diabetes Father     Hypertension Father     Hyperlipidemia Father     Down syndrome Sister     Diabetes Sister     Hyperlipidemia Brother     Heart Problems Brother     Goiter Maternal Aunt     Thyroid nodules Sister     Cancer Cousin 79        eye; maternal first cousin   Goiter Cousin     Cancer Paternal Aunt          unknown form of cancer   Cancer Cousin          unknown form of cancer; paternal first cousin   Pancreatic cancer Cousin 41        paternal first cousin   Cancer Cousin 27        unknown cancer; paternal first cousin   Cancer Cousin 76        unknown cancer; paternal first cousin           Past Surgical History:  Procedure Laterality Date   BILIARY STENT PLACEMENT N/A 12/25/2018    Procedure: BILIARY STENT PLACEMENT;  Surgeon: Carol Ada, MD;  Location: [facility];  Service: Endoscopy;  Laterality: N/A;   BILIARY STENT PLACEMENT N/A 01/01/2019    Procedure: BILIARY STENT PLACEMENT;  Surgeon: Carol Ada, MD;  Location: [facility];  Service: Endoscopy;  Laterality: N/A;   DILATATION & CURETTAGE/HYSTEROSCOPY WITH TRUECLEAR N/A 01/20/2014    Procedure:  DILATATION & CURETTAGE/HYSTEROSCOPY WITH TRUCLEAR;  Surgeon: [name], MD;  Location: [facility];  Service: Gynecology;  Laterality: N/A;   DILATATION & CURRETTAGE/HYSTEROSCOPY WITH RESECTOCOPE N/A 01/20/2014    Procedure: DILATATION & CURETTAGE/HYSTEROSCOPY WITH RESECTOCOPE;  Surgeon: [name], MD;  Location: [facility];  Service: Gynecology;  Laterality: N/A;   ENDOSCOPIC RETROGRADE CHOLANGIOPANCREATOGRAPHY (ERCP) WITH PROPOFOL N/A 12/25/2018    Procedure: ENDOSCOPIC RETROGRADE CHOLANGIOPANCREATOGRAPHY (ERCP) WITH PROPOFOL;  Surgeon: Carol Ada, MD;  [facility];  Service: Endoscopy;  Laterality: N/A;   ENDOSCOPIC RETROGRADE CHOLANGIOPANCREATOGRAPHY (ERCP) WITH PROPOFOL N/A 01/01/2019    Procedure: ENDOSCOPIC RETROGRADE CHOLANGIOPANCREATOGRAPHY (ERCP) WITH PROPOFOL;  Surgeon: Carol Ada, MD;  [facility]: [facility];  Service: Endoscopy;  Laterality: N/A;   ERCP   12/25/2018   FINE NEEDLE ASPIRATION N/A 01/01/2019    Procedure: FINE NEEDLE ASPIRATION (FNA) LINEAR;  Surgeon: Carol Ada, MD;  [facility]: [facility];  Service: Endoscopy;  Laterality: N/A;   LAPAROSCOPY N/A 03/04/2019    Procedure: LAPAROSCOPY DIAGNOSTIC;  Surgeon: Stark Klein, MD;  Location: [place];  Service: General;  Laterality: N/A;  GENERAL AND EPIDURAL   NO PAST SURGERIES       PARTIAL NEPHRECTOMY Right 03/04/2019    Procedure: Nephrectomy Partial;  Surgeon: Cleon Gustin, MD;  Location: [place];  Service: Urology;  Laterality: Right;   PORTACATH PLACEMENT Left 04/21/2019    Procedure: INSERTION PORT-A-CATH;  Surgeon: Stark Klein, MD;  Location: [facility];  Service: General;  Laterality: Left;   SPHINCTEROTOMY   12/25/2018    Procedure: Joan Mayans;  Surgeon: Carol Ada, MD;  [facility]: [facility];  Service: Endoscopy;;   STENT REMOVAL   01/01/2019    Procedure: STENT REMOVAL;  Surgeon: Carol Ada, MD;  [facility]: [facility];  Service: Endoscopy;;   UPPER ESOPHAGEAL ENDOSCOPIC ULTRASOUND (EUS)  N/A 01/01/2019    Procedure: UPPER ESOPHAGEAL ENDOSCOPIC ULTRASOUND (EUS);  Surgeon: Carol Ada, MD;  Location: [facility];  Service: Endoscopy;  Laterality: N/A;   WHIPPLE PROCEDURE N/A 03/04/2019    Procedure: WHIPPLE PROCEDURE;  Surgeon: Stark Klein, MD;  Location: [place];  Service: General;  Laterality: N/A;  GENERAL AND EPIDURAL      Social History         Socioeconomic History   Marital status: Single      Spouse name: Not on file   Number of children: Not on file   Years of education: Not on file   Highest education level: Not on file  Occupational History   Not on file  Tobacco Use   Smoking status: Never Smoker   Smokeless tobacco: Never Used  Vaping Use   Vaping Use: Never used  Substance and Sexual Activity   Alcohol use: Yes      Comment: rarely   Drug use: No   Sexual activity: Yes      Birth control/protection: Post-menopausal  Other Topics Concern   Not on file  Social History Narrative   Not on file    Social Determinants of Health    Financial Resource Strain: Not on file  Food Insecurity: Not on file  Transportation Needs: Not on file  Physical Activity: Not on file  Stress: Not on file  Social Connections: Not on file  Intimate Partner Violence: Not on file          Allergies  Allergen Reactions   Cherry Rash   Lemon Oil Rash            Outpatient Medications Prior to Visit  Medication Sig Dispense Refill   Continuous Blood Gluc Receiver (FREESTYLE LIBRE 14 DAY READER) DEVI USE READER TO CHECK BLOOD GLUCOSE WITH FREESTYLE LIBRE SENSORS. 1 Device 0   Continuous Blood Gluc Sensor (FREESTYLE LIBRE 2 SENSOR) MISC APPLY EVERY 14 DAYS 2 each 5   dapagliflozin propanediol (FARXIGA) 5 MG TABS tablet Take 1 tablet (5 mg total) by mouth daily. 30 tablet 3   glucose blood test strip Check sugar 2 times daily. 100 each 12   HUMALOG MIX 75/25 KWIKPEN (75-25) 100 UNIT/ML Kwikpen ADMINISTER 24 UNITS UNDER THE SKIN EVERY DAY BEFORE LUNCH (Patient  taking differently: Inject 18 Units into the skin daily before breakfast.) 30 mL 2   Insulin Degludec (  TRESIBA FLEXTOUCH [place]) Inject into the skin. 10 units daily       insulin lispro (HUMALOG KWIKPEN) 100 UNIT/ML KwikPen Inject 20 units under the skin three times daily before meals. (Patient taking differently: Inject 3-20 Units into the skin 3 (three) times daily as needed (high blood sugar).) 60 pen 3   Insulin Pen Needle (NOVOFINE PLUS PEN NEEDLE) 32G X 4 MM MISC USE TO INJECT INSULIN THREE TIMES DAILY. E11.65 300 each 1   KLOR-CON M20 20 MEQ tablet TAKE 1 TABLET DAILY 90 tablet 3   lidocaine-prilocaine (EMLA) cream Apply 1 application topically as directed. Apply 1 hour prior to stick and cover with plastic wrap 30 g 5   lisinopril-hydrochlorothiazide (ZESTORETIC) 10-12.5 MG tablet TAKE 1 TABLET DAILY 90 tablet 3   loratadine (CLARITIN) 10 MG tablet Take 10 mg by mouth at bedtime.        magnesium oxide (MAG-OX) 400 (241.3 Mg) MG tablet TAKE 1 TABLET(400 MG) BY MOUTH TWICE DAILY (Patient taking differently: Take 400 mg by mouth 2 (two) times daily.) 60 tablet 2   metFORMIN (GLUCOPHAGE-XR) 500 MG 24 hr tablet TAKE 4 TABLETS ONCE DAILY (Patient taking differently: Take 2,000 mg by mouth at bedtime.) 360 tablet 3   rosuvastatin (CRESTOR) 10 MG tablet Take 1 tablet (10 mg total) by mouth at bedtime. 90 tablet 1             Facility-Administered Medications Prior to Visit  Medication Dose Route Frequency Provider Last Rate Last Admin   sodium chloride flush (NS) 0.9 % injection 10 mL  10 mL Intracatheter PRN Ladell Pier, MD   10 mL at 05/27/19 1342      Review of Systems  Constitutional:  Negative for chills, fever, malaise/fatigue and weight loss.  HENT:  Negative for hearing loss, sore throat and tinnitus.   Eyes:  Negative for blurred vision and double vision.  Respiratory:  Negative for cough, hemoptysis, sputum production, shortness of breath, wheezing and stridor.   Cardiovascular:   Negative for chest pain, palpitations, orthopnea, leg swelling and PND.  Gastrointestinal:  Negative for abdominal pain, constipation, diarrhea, heartburn, nausea and vomiting.  Genitourinary:  Negative for dysuria, hematuria and urgency.  Musculoskeletal:  Negative for joint pain and myalgias.  Skin:  Negative for itching and rash.  Neurological:  Negative for dizziness, tingling, weakness and headaches.  Endo/Heme/Allergies:  Negative for environmental allergies. Does not bruise/bleed easily.  Psychiatric/Behavioral:  Negative for depression. The patient is not nervous/anxious and does not have insomnia.   All other systems reviewed and are negative.        Objective:   General appearance: 55 y.o., female, NAD, conversant  Eyes: anicteric sclerae, moist conjunctivae; no lid-lag; PERRLA, tracking appropriately HENT: NCAT; oropharynx, MMM, no mucosal ulcerations; normal hard and soft palate Neck: Trachea midline; FROM, supple, lymphadenopathy, no JVD Lungs: CTAB, no crackles, no wheeze, with normal respiratory effort and no intercostal retractions CV: RRR, S1, S2, no MRGs  Abdomen: Soft, non-tender; non-distended, BS present  Extremities: No peripheral edema, radial and DP pulses present bilaterally  Skin: Normal temperature, turgor and texture; no rash Psych: Appropriate affect Neuro: Alert and oriented to person and place, no focal deficit       BP 126/73   Pulse 61   Temp 98.1 F (36.7 C) (Oral)   Resp 17   Ht 5' 8"  (1.727 m)   Wt 87.5 kg   LMP 09/17/2012   SpO2 100%   BMI 29.35 kg/m  CBC Labs (Brief)          Component Value Date/Time    WBC 4.6 07/31/2020 1113    WBC 4.1 05/26/2020 0956    RBC 3.87 07/31/2020 1113    RBC 3.42 (L) 05/26/2020 0956    HGB 11.8 07/31/2020 1113    HGB 11.6 07/15/2014 1048    HCT 36.8 07/31/2020 1113    HCT 35.8 07/15/2014 1048    PLT 340 07/31/2020 1113    MCV 95 07/31/2020 1113    MCV 88.6 07/15/2014 1048    MCH 30.5  07/31/2020 1113    MCH 29.8 05/26/2020 0956    MCHC 32.1 07/31/2020 1113    MCHC 31.3 05/26/2020 0956    RDW 12.5 07/31/2020 1113    RDW 12.9 07/15/2014 1048    LYMPHSABS 1.2 05/26/2020 0956    LYMPHSABS 2.4 07/15/2014 1048    MONOABS 0.3 05/26/2020 0956    MONOABS 0.3 07/15/2014 1048    EOSABS 0.1 05/26/2020 0956    EOSABS 0.2 07/15/2014 1048    BASOSABS 0.0 05/26/2020 0956    BASOSABS 0.0 07/15/2014 1048            Chest Imaging: 10/24/2020: Multiple bilateral thick-walled cavitary lesions that have progressed in size over the past year. The patient's images have been independently reviewed by me.     01/09/2021: CT multiple cavitary lesions The patient's images have been independently reviewed by me.    Pulmonary Functions Testing Results: No flowsheet data found.   FeNO:    Pathology:    Echocardiogram:    Heart Catheterization:     Assessment & Plan:        ICD-10-CM    1. Cavitary lesion of lung  J98.4 Ambulatory referral to Pulmonology      Procedural/ Surgical Case Request: VIDEO BRONCHOSCOPY WITH ENDOBRONCHIAL NAVIGATION      ANA,IFA RA Diag Pnl w/rflx Tit/Patn      Sjogrens syndrome-A extractable nuclear antibody      Sjogrens syndrome-B extractable nuclear antibody      ANCA Screen Reflex Titer      Anti-scleroderma antibody      Sed Rate (ESR)      Cyclic citrul peptide antibody, IgG      Mpo/pr-3 (anca) antibodies      Angiotensin converting enzyme      Anti-scleroderma antibody  2. Primary pancreatic neuroendocrine tumor  D3A.8    3. Multiple pulmonary nodules  R91.8        Discussion:   This is a 55 year old female, family history of rheumatologic diseases, Hashimoto's with a sister, sister with RA, uncle with RA.  Patient has had a diagnosis of pancreatic cancer.  She underwent treatments for this and surgery.  She had multiple small pulmonary nodules dating back since August 2020.  After being diagnosed with pancreatic cancer she underwent  surgery and subsequent follow-up of these nodules.  During the time in which she was receiving chemotherapy these nodules stayed small and stable.  Some of the more dominant nodules had cavitation at the time.  She had subsequently been followed through the calendar year 2021 into 2022 with surveillance CT imaging.  The cavitary nodules have become larger.  Over calendar year 2022.   Clinically since she does not have any infectious symptoms night sweats fever.  I suspect we are dealing with a potential autoimmune disease of the lung or vasculitis.  Does not appear infectious although does not rule out atypical infections such as cavitary  NTM or fungal disease.      Plan: Here today for bronchoscopy  Discussed risks, benefits, alternatives  She is agreeable to proceed.      Garner Nash, DO [facility] 01/30/2021 10:06 AM

## 2021-01-30 NOTE — Discharge Instructions (Signed)
Flexible Bronchoscopy, Care After This sheet gives you information about how to care for yourself after your test. Your doctor may also give you more specific instructions. If you have problems or questions, contact your doctor. Follow these instructions at home: Eating and drinking Do not eat or drink anything (not even water) for 2 hours after your test, or until your numbing medicine (local anesthetic) wears off. When your numbness is gone and your cough and gag reflexes have come back, you may: Eat only soft foods. Slowly drink liquids. The day after the test, go back to your normal diet. Driving Do not drive for 24 hours if you were given a medicine to help you relax (sedative). Do not drive or use heavy machinery while taking prescription pain medicine. General instructions  Take over-the-counter and prescription medicines only as told by your doctor. Return to your normal activities as told. Ask what activities are safe for you. Do not use any products that have nicotine or tobacco in them. This includes cigarettes and e-cigarettes. If you need help quitting, ask your doctor. Keep all follow-up visits as told by your doctor. This is important. It is very important if you had a tissue sample (biopsy) taken. Get help right away if: You have shortness of breath that gets worse. You get light-headed. You feel like you are going to pass out (faint). You have chest pain. You cough up: More than a little blood. More blood than before. Summary Do not eat or drink anything (not even water) for 2 hours after your test, or until your numbing medicine wears off. Do not use cigarettes. Do not use e-cigarettes. Get help right away if you have chest pain.  This information is not intended to replace advice given to you by your health care provider. Make sure you discuss any questions you have with your health care provider. Document Released: 03/24/2009 Document Revised: 05/09/2017 Document  Reviewed: 06/14/2016 Elsevier Patient Education  2020 [employer].

## 2021-01-30 NOTE — Transfer of Care (Signed)
Immediate Anesthesia Transfer of Care Note  Patient: Tanikka Bresnan  Procedure(s) Performed: VIDEO BRONCHOSCOPY WITH ENDOBRONCHIAL NAVIGATION (Bilateral) RADIAL ENDOBRONCHIAL ULTRASOUND BRONCHIAL BIOPSIES BRONCHIAL BRUSHINGS BRONCHIAL NEEDLE ASPIRATION BIOPSIES BRONCHIAL WASHINGS  Patient Location: PACU  Anesthesia Type:General  Level of Consciousness: awake, alert , oriented and patient cooperative  Airway & Oxygen Therapy: Patient Spontanous Breathing  Post-op Assessment: Report given to RN, Post -op Vital signs reviewed and stable and Patient moving all extremities X 4  Post vital signs: Reviewed and stable  Last Vitals:  Vitals Value Taken Time  BP 150/91 01/30/21 1338  Temp 36.3 C 01/30/21 1338  Pulse 97 01/30/21 1348  Resp 18 01/30/21 1348  SpO2 96 % 01/30/21 1348  Vitals shown include unvalidated device data.  Last Pain:  Vitals:   01/30/21 1338  TempSrc:   PainSc: 0-No pain      Patients Stated Pain Goal: 0 (57/84/69 [id number])  Complications: No notable events documented.

## 2021-01-31 ENCOUNTER — Encounter (HOSPITAL_COMMUNITY): Payer: Self-pay | Admitting: Pulmonary Disease

## 2021-01-31 LAB — ACID FAST SMEAR (AFB, MYCOBACTERIA)
Acid Fast Smear: NEGATIVE
Acid Fast Smear: NEGATIVE

## 2021-02-02 LAB — CYTOLOGY - NON PAP

## 2021-02-04 LAB — AEROBIC/ANAEROBIC CULTURE W GRAM STAIN (SURGICAL/DEEP WOUND)
Culture: NO GROWTH
Culture: NO GROWTH

## 2021-02-05 NOTE — Progress Notes (Signed)
[facility] OFFICE PROGRESS NOTE   Diagnosis: Pancreas cancer  INTERVAL HISTORY:   Alyssa Ross returns as scheduled.  She underwent a navigational bronchoscopy by Dr. Valeta Harms on 01/30/2021.  She tolerated procedure well.  She feels well.  No new complaint.  No dyspnea or cough.   Transbronchial biopsies were obtained from a cavitary left lower lobe nodule and a cavitary right upper lobe nodule.  Lavage samples were obtained from the left lower lobe and right upper lobe. The cytology from the left lower lobe FNA revealed malignant cells consistent with adenocarcinoma.  Left lower lobe lavage also revealed adenocarcinoma.  The right upper lobe brushing and FNA biopsies returned with adenocarcinoma.  The cells are positive for cytokeratin 7, cytokeratin 20, and weak CDX2.  TTF-1 and Napsin A are negative.  The immunohistochemical profile is consistent with metastatic pancreas cancer. Objective:  Vital signs in last 24 hours:  Blood pressure (!) 147/91, pulse 60, temperature 97.8 F (36.6 C), temperature source Oral, resp. rate 18, height 5' 8"  (1.727 m), weight 192 lb 9.6 oz (87.4 kg), last menstrual period 09/17/2012, SpO2 98 %.    Lymphatics: No cervical or supraclavicular nodes Resp: Decreased breath sounds at the right lower posterior chest, no respiratory distress Cardio: Regular rate and rhythm GI: No hepatosplenomegaly, no mass, nontender Vascular: No leg edema   Portacath/PICC-without erythema  Lab Results:  Lab Results  Component Value Date   WBC 4.4 01/30/2021   HGB 11.1 (L) 01/30/2021   HCT 34.9 (L) 01/30/2021   MCV 96.4 01/30/2021   PLT 248 01/30/2021   NEUTROABS 2.4 05/26/2020    CMP  Lab Results  Component Value Date   NA 139 01/30/2021   K 3.8 01/30/2021   CL 107 01/30/2021   CO2 24 01/30/2021   GLUCOSE 229 (H) 01/30/2021   BUN 7 01/30/2021   CREATININE 1.11 (H) 01/30/2021   CALCIUM 9.2 01/30/2021   PROT 6.6 10/24/2020   ALBUMIN 3.8 10/24/2020    AST 37 10/24/2020   ALT 40 10/24/2020   ALKPHOS 104 10/24/2020   BILITOT 0.6 10/24/2020   GFRNONAA 59 (L) 01/30/2021   GFRAA >60 02/07/2020    Lab Results  Component Value Date   CAN199 22 10/24/2020    Lab Results  Component Value Date   INR 1.1 05/26/2020   LABPROT 13.5 05/26/2020    Imaging:  No results found.  Medications: I have reviewed the patient's current medications.   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 Tumor invades the duodenal wall and vascular groove, resection margins negative, 4/34 lymph nodes positive MSI-stable, tumor showed instability in 2 loci as did adjacent normal tissue EUS FNA biopsy of pancreas mass on 07/03/2018-well-differentiated neuroendocrine tumor CTs 01/29/2019-ill-defined pancreas head mass, 5 pulmonary nodules-1 with a small amount of central cavitation, tumor abuts the left margin of the portal vein indistinct density surrounding, hepatic artery, complex cystic lesion of the right kidney, right adrenal mass-characterized as an adenoma on a [facility] MRI 12/21/2018 [facility] 03/03/2019-no focal pancreas activity, no tracer accumulation within the suspicious pulmonary nodules, left uterine mass with tracer accumulation felt to represent a leiomyoma Elevated preoperative CA 19-9--CA 19-9 186 on 01/18/2019 CT chest 04/16/2019-multiple bilateral pulmonary nodules, some with increased cavitation, stable in size Cycle 1 FOLFIRINOX 04/27/2019 Cycle 2 FOLFIRINOX 05/11/2019 Cycle 3 FOLFIRINOX 05/23/2019 Cycle 4 FOLFIRINOX 06/08/2019 Cycle 5 FOLFIRINOX 06/22/2019 CT chest 07/02/2019-stable size of bilateral pulmonary nodules.  Dominant cavitary lesions in both lungs show increased  cavitation with thinner walls.  Stable 2.1 cm right adrenal nodule. Cycle 6 FOLFIRINOX 07/06/2019 Cycle 7 FOLFIRINOX 07/21/2019 Cycle 8 FOLFIRINOX 08/03/2019, oxaliplatin deleted secondary to neuropathy CT chest  08/24/2019-decreased size of several lung nodules with resolution of a left upper lobe nodule, no new nodules Radiation to the pancreas surgical area with concurrent Xeloda 09/13/2019-10/20/2019  CTs 11/29/2019-multiple small pulmonary nodules scattered throughout the lungs bilaterally, appear increased in number and size. No definite evidence of metastatic disease in the abdomen or pelvis. Markedly enlarged and heterogeneous appearing uterus, likely to represent multifocal fibroids. 1 of these lesions appears to be an exophytic subserosal fibroid in the posterior lateral aspect of the uterine body on the left side although this comes in close proximity to the left adnexa such that a primary ovarian lesion is difficult to completely exclude. CTs 02/07/2020-slight enlargement of bilateral lung nodules, some are cavitary, no evidence of metastatic disease in the abdomen or pelvis, stable right adrenal nodule, uterine fibroids CTs 04/26/2020-mild enlargement of pulmonary nodules, slight increase in trace pelvic fluid, new soft tissue thickening inferior to the cecal tip suspicious for peritoneal metastasis CT 05/26/2020-improved appearance of soft tissue at the inferior tip of the cecum, mildly thickened short appendix-findings suggestive of resolving appendicitis, stable small bibasilar pulmonary nodules, fibroids Plan biopsy of right cecal tip soft tissue canceled secondary to radiologic improvement CT chest 08/02/2020-enlargement and progressive cavitation of multiple bilateral lung nodules.  Some new nodules are present. CTs 10/24/2020- increase in size of pulmonary nodules, no new nodules, no evidence of metastatic disease in the abdomen, stable right adrenal nodule CT 01/09/2021-slight interval enlargement of pulmonary nodules, stable right adrenal nodule Navigation bronchoscopy 01/30/2021-left lower lobe cavitary nodule FNA-adenocarcinoma, brushing-adenocarcinoma.  Left lower lobe lavage-adenocarcinoma.  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 Tamburro appears stable.  The bronchoscopic biopsies confirmed a diagnosis of metastatic pancreas cancer.  She appears to have indolent disease with slowly growing bilateral lung nodules.  She remains asymptomatic.  I discussed treatment options including continued observation, standard chemotherapy with gemcitabine/Abraxane, and referral to consider clinical trial.  She would like to hold on beginning treatment for now.  I will ask Dr. Berneice Gandy to have his research team contact her regarding a clinical trial at [facility].  She will return for an office visit, Port-A-Cath flush, and further discussion in late September.  If she does not enroll on a clinical trial will plan for a restaging CT and initiation of gemcitabine/Abraxane following that visit.    [name], MD  02/06/2021  8:54 AM

## 2021-02-05 NOTE — Progress Notes (Signed)
Brad,   I called and discussed pathology results + for metastatic pancreatic cancer. She has a follow up with you tomorrow morning. Thanks for the referral and sorry about the delay. Her insurance company denied services that she needed and we did the procedure anyway. Thankfully we now have an answer.   Thanks,  BLI  Garner Nash, DO [facility] 02/05/2021 9:15 AM

## 2021-02-06 ENCOUNTER — Inpatient Hospital Stay: Payer: BC Managed Care – PPO | Admitting: Oncology

## 2021-02-06 ENCOUNTER — Other Ambulatory Visit: Payer: Self-pay

## 2021-02-06 VITALS — BP 147/91 | HR 60 | Temp 97.8°F | Resp 18 | Ht 68.0 in | Wt 192.6 lb

## 2021-02-06 DIAGNOSIS — I10 Essential (primary) hypertension: Secondary | ICD-10-CM | POA: Diagnosis not present

## 2021-02-06 DIAGNOSIS — R918 Other nonspecific abnormal finding of lung field: Secondary | ICD-10-CM | POA: Diagnosis not present

## 2021-02-06 DIAGNOSIS — Z452 Encounter for adjustment and management of vascular access device: Secondary | ICD-10-CM | POA: Diagnosis not present

## 2021-02-06 DIAGNOSIS — C250 Malignant neoplasm of head of pancreas: Secondary | ICD-10-CM | POA: Diagnosis not present

## 2021-02-06 DIAGNOSIS — E1140 Type 2 diabetes mellitus with diabetic neuropathy, unspecified: Secondary | ICD-10-CM | POA: Diagnosis not present

## 2021-02-07 ENCOUNTER — Telehealth: Payer: Self-pay

## 2021-02-07 NOTE — Telephone Encounter (Signed)
Per Dr Benay Spice Pt notified that Dr Berneice Gandy or his research nurse will give her a call about the clinical trial. Pt verbalized understanding. No further problems or concerns noted.

## 2021-02-14 ENCOUNTER — Other Ambulatory Visit: Payer: Self-pay

## 2021-02-14 ENCOUNTER — Encounter: Payer: Self-pay | Admitting: Internal Medicine

## 2021-02-14 ENCOUNTER — Encounter: Payer: Self-pay | Admitting: Genetic Counselor

## 2021-02-14 ENCOUNTER — Ambulatory Visit: Payer: BC Managed Care – PPO | Admitting: Internal Medicine

## 2021-02-14 VITALS — BP 118/70 | HR 69 | Temp 98.2°F | Ht 68.0 in | Wt 200.2 lb

## 2021-02-14 DIAGNOSIS — Z794 Long term (current) use of insulin: Secondary | ICD-10-CM

## 2021-02-14 DIAGNOSIS — C7A8 Other malignant neuroendocrine tumors: Secondary | ICD-10-CM | POA: Diagnosis not present

## 2021-02-14 DIAGNOSIS — J984 Other disorders of lung: Secondary | ICD-10-CM | POA: Diagnosis not present

## 2021-02-14 DIAGNOSIS — E1165 Type 2 diabetes mellitus with hyperglycemia: Secondary | ICD-10-CM | POA: Diagnosis not present

## 2021-02-14 DIAGNOSIS — I10 Essential (primary) hypertension: Secondary | ICD-10-CM | POA: Diagnosis not present

## 2021-02-14 MED ORDER — ROSUVASTATIN CALCIUM 10 MG PO TABS: 10.0000 mg | ORAL_TABLET | Freq: Every day | ORAL | 2 refills | Status: DC

## 2021-02-14 MED ORDER — LISINOPRIL-HYDROCHLOROTHIAZIDE 10-12.5 MG PO TABS: 1.0000 | ORAL_TABLET | Freq: Every day | ORAL | 3 refills | Status: DC

## 2021-02-14 MED ORDER — METFORMIN HCL ER 500 MG PO TB24: ORAL_TABLET | ORAL | 3 refills | Status: DC

## 2021-02-14 NOTE — Progress Notes (Signed)
I,Katawbba Wiggins,acting as a Education administrator for Maximino Greenland, MD.,have documented all relevant documentation on the behalf of Maximino Greenland, MD,as directed by  Maximino Greenland, MD while in the presence of Maximino Greenland, MD.  This visit occurred during the SARS-CoV-2 public health emergency.  Safety protocols were in place, including screening questions prior to the visit, additional usage of staff PPE, and extensive cleaning of exam room while observing appropriate contact time as indicated for disinfecting solutions.  Subjective:     Patient ID: Alyssa Ross , female    DOB: 1966/03/28 , 55 y.o.   MRN: [id number]   Chief Complaint  Patient presents with   Diabetes   Hypertension    HPI  She is here today for BP/DM check. She is followed by Endo for diabetes med management. She is also being followed by Oncology for pancreatic adenoCA. She recently had bronchoscopy performed 01/30/21, pos for adenoCA.      Diabetes She presents for her follow-up diabetic visit. She has type 2 diabetes mellitus. There are no hypoglycemic associated symptoms. Pertinent negatives for diabetes include no blurred vision and no chest pain. There are no hypoglycemic complications. Risk factors for coronary artery disease include diabetes mellitus, hypertension and dyslipidemia. Current diabetic treatment includes insulin injections. She is compliant with treatment most of the time. She is following a diabetic diet. She participates in exercise intermittently. Eye exam is current.  Hypertension This is a chronic problem. The current episode started more than 1 year ago. The problem has been gradually improving since onset. The problem is uncontrolled. Pertinent negatives include no blurred vision, chest pain, palpitations or shortness of breath. Past treatments include ACE inhibitors and diuretics. The current treatment provides moderate improvement.    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   Lung nodule    pancreatic ca dx'd 11/2018   Neuroendocrine Tumor of the pancreas   PONV (postoperative nausea and vomiting)    nausea  vomitting after 12/25/18 ERCP   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   Yeast vaginitis 2006     Family History  Problem Relation Age of Onset   Diabetes Mother    Dementia Mother    Hyperlipidemia Mother    Hypertension Mother    Irregular heart beat Mother    Diabetes Father    Hypertension Father    Hyperlipidemia Father    Down syndrome Sister    Diabetes Sister    Hyperlipidemia Brother    Heart Problems Brother    Goiter Maternal Aunt    Thyroid nodules Sister    Cancer Cousin 25       eye; maternal first cousin   Goiter Cousin    Cancer Paternal Aunt        unknown form of cancer   Cancer Cousin        unknown form of cancer; paternal first cousin   Pancreatic cancer Cousin 29       paternal first cousin   Cancer Cousin 46       unknown cancer; paternal  first cousin   Cancer Cousin 69       unknown cancer; paternal first cousin     Current Outpatient Medications:    Continuous Blood Gluc Receiver (FREESTYLE LIBRE 14 DAY READER) DEVI, USE READER TO CHECK BLOOD GLUCOSE WITH FREESTYLE LIBRE SENSORS., Disp: 1 Device, Rfl: 0   Continuous Blood Gluc Sensor (FREESTYLE LIBRE 2 SENSOR) MISC, APPLY EVERY 14 DAYS, Disp: 2 each, Rfl: 5   glucose blood test strip, Check sugar 2 times daily., Disp: 100 each, Rfl: 12   HUMALOG MIX 75/25 KWIKPEN (75-25) 100 UNIT/ML Kwikpen, ADMINISTER 24 UNITS UNDER THE SKIN EVERY DAY BEFORE LUNCH (Patient taking differently: Inject  7-18 Units into the skin See admin instructions. 18 units in the morning, 7-10 units at bedtime), Disp: 30 mL, Rfl: 2   insulin lispro (HUMALOG) 100 UNIT/ML KwikPen, INJECT 20 UNITS UNDER THE SKIN THREE TIMES DAILY BEFORE MEALS (Patient taking differently: Based on what she eats sliding scale the skin three times daily before meals.), Disp: 60 mL, Rfl: 2   lidocaine-prilocaine (EMLA) cream, Apply 1 application topically as directed. Apply 1 hour prior to stick and cover with plastic wrap, Disp: 30 g, Rfl: 5   loratadine (CLARITIN) 10 MG tablet, Take 10 mg by mouth at bedtime. , Disp: , Rfl:    Insulin Pen Needle (BD PEN NEEDLE NANO U/F) 32G X 4 MM MISC, USE TO INJECT INSULIN THREE TIMES A DAY, Disp: 270 each, Rfl: 3   lisinopril-hydrochlorothiazide (ZESTORETIC) 10-12.5 MG tablet, Take 1 tablet by mouth daily., Disp: 90 tablet, Rfl: 3   MAGnesium-Oxide 400 (240 Mg) MG tablet, Take 1 tablet (400 mg total) by mouth 2 (two) times daily., Disp: 60 tablet, Rfl: 0   metFORMIN (GLUCOPHAGE-XR) 500 MG 24 hr tablet, TAKE 4 TABLETS ONCE DAILY, Disp: 360 tablet, Rfl: 3   potassium chloride SA (KLOR-CON) 20 MEQ tablet, TAKE 1 TABLET BY MOUTH TWICE DAILY, Disp: 180 tablet, Rfl: 0   rosuvastatin (CRESTOR) 10 MG tablet, Take 1 tablet (10 mg total) by mouth at bedtime., Disp: 90 tablet, Rfl: 2   Allergies  Allergen Reactions   Cherry Rash   Lemon Oil Rash     Review of Systems  Constitutional: Negative.   Eyes:  Negative for blurred vision.  Respiratory: Negative.  Negative for shortness of breath.   Cardiovascular: Negative.  Negative for chest pain and palpitations.  Gastrointestinal: Negative.   Neurological: Negative.   Psychiatric/Behavioral: Negative.      Today's Vitals   02/14/21 1626  BP: 118/70  Pulse: 69  Temp: 98.2 F (36.8 C)  TempSrc: Oral  Weight: 200 lb 3.2 oz (90.8 kg)  Height: 5\' 8"  (1.727 m)   Body mass index is 30.44 kg/m.   Objective:  Physical Exam Vitals and nursing note  reviewed.  Constitutional:      Appearance: Normal appearance.  HENT:     Head: Normocephalic and atraumatic.     Nose:     Comments: Masked     Mouth/Throat:     Comments: Masked  Eyes:     Extraocular Movements: Extraocular movements intact.  Cardiovascular:     Rate and Rhythm: Normal rate and regular rhythm.     Heart sounds: Normal heart sounds.  Pulmonary:     Effort: Pulmonary effort is normal.     Breath sounds: Normal breath sounds.  Musculoskeletal:     Cervical back: Normal range of motion.  Skin:    General: Skin is warm.  Neurological:  General: No focal deficit present.     Mental Status: She is alert.  Psychiatric:        Mood and Affect: Mood normal.        Behavior: Behavior normal.        Assessment And Plan:     1. Uncontrolled type 2 diabetes mellitus with hyperglycemia, with long-term current use of insulin (HCC) Comments: Chronic, now followed by [facility], a1c 7.8 on August 15th. Encouraged to follow dietary guidelines.   2. Essential hypertension Comments: Chronic, well controlled. Encouraged to follow low sodium diet. She will rto in six months for re-evaluation/physical exam.   3. Primary malignant neuroendocrine neoplasm of pancreas ([place]) Comments: S/p treatment with Folfirinox. Considering clinical trial due to lung metastasis.   4. Cavitary lesion of lung Comments: FNA/lavage pos for AdenoCA. Management as per Oncology.     Patient was given opportunity to ask questions. Patient verbalized understanding of the plan and was able to repeat key elements of the plan. All questions were answered to their satisfaction.   I, Maximino Greenland, MD, have reviewed all documentation for this visit. The documentation on 02/14/21 for the exam, diagnosis, procedures, and orders are all accurate and complete.   IF YOU HAVE BEEN REFERRED TO A SPECIALIST, IT MAY TAKE 1-2 WEEKS TO SCHEDULE/PROCESS THE REFERRAL. IF YOU HAVE NOT HEARD FROM  US/SPECIALIST IN TWO WEEKS, PLEASE GIVE [place] A CALL AT [phone] X [phone].   THE PATIENT IS ENCOURAGED TO PRACTICE SOCIAL DISTANCING DUE TO THE COVID-19 PANDEMIC.

## 2021-02-14 NOTE — Patient Instructions (Signed)

## 2021-02-19 ENCOUNTER — Other Ambulatory Visit: Payer: Self-pay | Admitting: Endocrinology

## 2021-02-19 DIAGNOSIS — E1165 Type 2 diabetes mellitus with hyperglycemia: Secondary | ICD-10-CM

## 2021-02-20 ENCOUNTER — Other Ambulatory Visit: Payer: Self-pay | Admitting: Oncology

## 2021-02-20 DIAGNOSIS — H43813 Vitreous degeneration, bilateral: Secondary | ICD-10-CM | POA: Diagnosis not present

## 2021-02-20 DIAGNOSIS — H2513 Age-related nuclear cataract, bilateral: Secondary | ICD-10-CM | POA: Diagnosis not present

## 2021-02-20 DIAGNOSIS — H31091 Other chorioretinal scars, right eye: Secondary | ICD-10-CM | POA: Diagnosis not present

## 2021-02-20 DIAGNOSIS — H35413 Lattice degeneration of retina, bilateral: Secondary | ICD-10-CM | POA: Diagnosis not present

## 2021-02-20 LAB — CULTURE, FUNGUS WITHOUT SMEAR

## 2021-02-21 ENCOUNTER — Other Ambulatory Visit: Payer: Self-pay | Admitting: *Deleted

## 2021-02-21 ENCOUNTER — Other Ambulatory Visit: Payer: Self-pay | Admitting: Oncology

## 2021-02-21 DIAGNOSIS — C250 Malignant neoplasm of head of pancreas: Secondary | ICD-10-CM

## 2021-02-21 NOTE — Progress Notes (Signed)
Per Dr. Benay Spice: Do not refill magnesium oxide. Most likely no longer needed. Will check Mg+ level at next visit.

## 2021-03-02 ENCOUNTER — Other Ambulatory Visit: Payer: Self-pay

## 2021-03-02 ENCOUNTER — Other Ambulatory Visit: Payer: Self-pay | Admitting: Oncology

## 2021-03-02 ENCOUNTER — Inpatient Hospital Stay: Payer: BC Managed Care – PPO | Attending: Genetic Counselor | Admitting: Oncology

## 2021-03-02 ENCOUNTER — Inpatient Hospital Stay: Payer: BC Managed Care – PPO

## 2021-03-02 VITALS — BP 121/86 | HR 61 | Temp 98.1°F | Resp 18 | Ht 68.0 in | Wt 192.0 lb

## 2021-03-02 DIAGNOSIS — Z79899 Other long term (current) drug therapy: Secondary | ICD-10-CM | POA: Insufficient documentation

## 2021-03-02 DIAGNOSIS — E119 Type 2 diabetes mellitus without complications: Secondary | ICD-10-CM | POA: Diagnosis not present

## 2021-03-02 DIAGNOSIS — Z7189 Other specified counseling: Secondary | ICD-10-CM | POA: Diagnosis not present

## 2021-03-02 DIAGNOSIS — C250 Malignant neoplasm of head of pancreas: Secondary | ICD-10-CM | POA: Insufficient documentation

## 2021-03-02 DIAGNOSIS — Z9049 Acquired absence of other specified parts of digestive tract: Secondary | ICD-10-CM | POA: Insufficient documentation

## 2021-03-02 DIAGNOSIS — Z0000 Encounter for general adult medical examination without abnormal findings: Secondary | ICD-10-CM | POA: Insufficient documentation

## 2021-03-02 DIAGNOSIS — Z905 Acquired absence of kidney: Secondary | ICD-10-CM | POA: Insufficient documentation

## 2021-03-02 DIAGNOSIS — I10 Essential (primary) hypertension: Secondary | ICD-10-CM | POA: Insufficient documentation

## 2021-03-02 DIAGNOSIS — Z452 Encounter for adjustment and management of vascular access device: Secondary | ICD-10-CM | POA: Diagnosis not present

## 2021-03-02 LAB — CMP (CANCER CENTER ONLY)
ALT: 33 U/L (ref 0–44)
AST: 37 U/L (ref 15–41)
Albumin: 4 g/dL (ref 3.5–5.0)
Alkaline Phosphatase: 93 U/L (ref 38–126)
Anion gap: 11 (ref 5–15)
BUN: 9 mg/dL (ref 6–20)
CO2: 25 mmol/L (ref 22–32)
Calcium: 9.6 mg/dL (ref 8.9–10.3)
Chloride: 103 mmol/L (ref 98–111)
Creatinine: 1.09 mg/dL — ABNORMAL HIGH (ref 0.44–1.00)
GFR, Estimated: 60 mL/min — ABNORMAL LOW (ref >60)
Glucose, Bld: 221 mg/dL — ABNORMAL HIGH (ref 70–99)
Potassium: 2.9 mmol/L — ABNORMAL LOW (ref 3.5–5.1)
Sodium: 139 mmol/L (ref 135–145)
Total Bilirubin: 0.5 mg/dL (ref 0.3–1.2)
Total Protein: 7.2 g/dL (ref 6.5–8.1)

## 2021-03-02 LAB — CBC WITH DIFFERENTIAL (CANCER CENTER ONLY)
Abs Immature Granulocytes: 0.01 10*3/uL (ref 0.00–0.07)
Basophils Absolute: 0 10*3/uL (ref 0.0–0.1)
Basophils Relative: 1 %
Eosinophils Absolute: 0.1 10*3/uL (ref 0.0–0.5)
Eosinophils Relative: 2 %
HCT: 33.8 % — ABNORMAL LOW (ref 36.0–46.0)
Hemoglobin: 11.2 g/dL — ABNORMAL LOW (ref 12.0–15.0)
Immature Granulocytes: 0 %
Lymphocytes Relative: 35 %
Lymphs Abs: 1.6 10*3/uL (ref 0.7–4.0)
MCH: 30.3 pg (ref 26.0–34.0)
MCHC: 33.1 g/dL (ref 30.0–36.0)
MCV: 91.4 fL (ref 80.0–100.0)
Monocytes Absolute: 0.3 10*3/uL (ref 0.1–1.0)
Monocytes Relative: 6 %
Neutro Abs: 2.5 10*3/uL (ref 1.7–7.7)
Neutrophils Relative %: 56 %
Platelet Count: 231 10*3/uL (ref 150–400)
RBC: 3.7 MIL/uL — ABNORMAL LOW (ref 3.87–5.11)
RDW: 11.9 % (ref 11.5–15.5)
WBC Count: 4.5 10*3/uL (ref 4.0–10.5)
nRBC: 0 % (ref 0.0–0.2)

## 2021-03-02 LAB — MAGNESIUM: Magnesium: 1.5 mg/dL — ABNORMAL LOW (ref 1.7–2.4)

## 2021-03-02 MED ORDER — MAGNESIUM OXIDE -MG SUPPLEMENT 400 (240 MG) MG PO TABS: 400.0000 mg | ORAL_TABLET | Freq: Two times a day (BID) | ORAL | 0 refills | Status: DC

## 2021-03-02 MED ORDER — POTASSIUM CHLORIDE CRYS ER 20 MEQ PO TBCR
20.0000 meq | EXTENDED_RELEASE_TABLET | Freq: Two times a day (BID) | ORAL | 1 refills | Status: DC
Start: 2021-03-02 — End: 2021-03-02

## 2021-03-02 NOTE — Progress Notes (Signed)
[place] OFFICE PROGRESS NOTE   Diagnosis: Pancreas cancer  INTERVAL HISTORY:   Ms. Hada returns as scheduled.  She feels well.  She is working.  Good appetite.  No new complaint.  Objective:  Vital signs in last 24 hours:  Blood pressure 121/86, pulse 61, temperature 98.1 F (36.7 C), temperature source Oral, resp. rate 18, height 5' 8" (1.727 m), weight 192 lb (87.1 kg), last menstrual period 09/17/2012, SpO2 99 %.    Resp: Lungs clear bilaterally Cardio: Regular rate and rhythm GI: No mass, no hepatosplenomegaly, nontender Vascular: No leg edema   Portacath/PICC-without erythema  Lab Results:  Lab Results  Component Value Date   WBC 4.5 03/02/2021   HGB 11.2 (L) 03/02/2021   HCT 33.8 (L) 03/02/2021   MCV 91.4 03/02/2021   PLT 231 03/02/2021   NEUTROABS 2.5 03/02/2021    CMP  Lab Results  Component Value Date   NA 139 03/02/2021   K 2.9 (L) 03/02/2021   CL 103 03/02/2021   CO2 25 03/02/2021   GLUCOSE 221 (H) 03/02/2021   BUN 9 03/02/2021   CREATININE 1.09 (H) 03/02/2021   CALCIUM 9.6 03/02/2021   PROT 7.2 03/02/2021   ALBUMIN 4.0 03/02/2021   AST 37 03/02/2021   ALT 33 03/02/2021   ALKPHOS 93 03/02/2021   BILITOT 0.5 03/02/2021   GFRNONAA 60 (L) 03/02/2021   GFRAA >60 02/07/2020    Lab Results  Component Value Date   MCR754 22 10/24/2020    Medications: I have reviewed the patient's current medications.   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 Tumor invades the duodenal wall and vascular groove, resection margins negative, 4/34 lymph nodes positive MSI-stable, tumor showed instability in 2 loci as did adjacent normal tissue EUS FNA biopsy of pancreas mass on 07/03/2018-well-differentiated neuroendocrine tumor CTs 01/29/2019-ill-defined pancreas head mass, 5 pulmonary nodules-1 with a small amount of central cavitation, tumor abuts the left margin of the portal vein  indistinct density surrounding, hepatic artery, complex cystic lesion of the right kidney, right adrenal mass-characterized as an adenoma on a [facility] MRI 12/21/2018 Netspot 03/03/2019-no focal pancreas activity, no tracer accumulation within the suspicious pulmonary nodules, left uterine mass with tracer accumulation felt to represent a leiomyoma Elevated preoperative CA 19-9--CA 19-9 186 on 01/18/2019 CT chest 04/16/2019-multiple bilateral pulmonary nodules, some with increased cavitation, stable in size Cycle 1 FOLFIRINOX 04/27/2019 Cycle 2 FOLFIRINOX 05/11/2019 Cycle 3 FOLFIRINOX 05/23/2019 Cycle 4 FOLFIRINOX 06/08/2019 Cycle 5 FOLFIRINOX 06/22/2019 CT chest 07/02/2019-stable size of bilateral pulmonary nodules.  Dominant cavitary lesions in both lungs show increased cavitation with thinner walls.  Stable 2.1 cm right adrenal nodule. 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 No definite evidence of metastatic disease in the abdomen or pelvis. Markedly enlarged and heterogeneous appearing uterus, likely to represent multifocal fibroids. 1 of these lesions appears to be an exophytic subserosal fibroid in the posterior lateral aspect of the uterine body on the left side although this comes in close proximity to the left adnexa such that a primary ovarian lesion is difficult to completely exclude. CTs 02/07/2020-slight enlargement of bilateral lung nodules, some are cavitary, no evidence  of metastatic disease in the abdomen or pelvis, stable right adrenal nodule, uterine fibroids CTs  04/26/2020-mild enlargement of pulmonary nodules, slight increase in trace pelvic fluid, new soft tissue thickening inferior to the cecal tip suspicious for peritoneal metastasis CT 05/26/2020-improved appearance of soft tissue at the inferior tip of the cecum, mildly thickened short appendix-findings suggestive of resolving appendicitis, stable small bibasilar pulmonary nodules, fibroids Plan biopsy of right cecal tip soft tissue canceled secondary to radiologic improvement CT chest 08/02/2020-enlargement and progressive cavitation of multiple bilateral lung nodules.  Some new nodules are present. CTs 10/24/2020- increase in size of pulmonary nodules, no new nodules, no evidence of metastatic disease in the abdomen, stable right adrenal nodule CT 01/09/2021-slight interval enlargement of pulmonary nodules, stable right adrenal nodule Navigation bronchoscopy 01/30/2021-left lower lobe cavitary nodule FNA-adenocarcinoma, brushing-adenocarcinoma.  Left lower lobe lavage-adenocarcinoma.  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 Swanner appears unchanged.  We discussed treatment options for the metastatic pancreas cancer.  She would like to begin systemic therapy in October.  She will be referred for a restaging chest CT during the week of 03/21/2021.  She will return for an office visit and chemotherapy on 03/28/2021.  She was not eligible  for clinical trial and I contacted medical oncology at [facility] last month.  I recommend treatment with gemcitabine/Abraxane.  We reviewed potential toxicities associated with this regimen including the chance of nausea/vomiting, alopecia, and hematologic toxicity.  We discussed the rash, fever, and pneumonitis associated with gemcitabine.  We reviewed the allergic reaction and various types of neuropathy seen with Abraxane.  She agrees to proceed.  The potassium is low today.  She will resume a magnesium supplement and increase the potassium dose.  She will return for a repeat chemistry panel in 2 weeks.  A chemotherapy plan was entered today.  [name], MD  03/02/2021  3:24 PM

## 2021-03-02 NOTE — Progress Notes (Signed)
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     A cycle is every 14 days:     Oxaliplatin      Leucovorin      Irinotecan      Fluorouracil   **Always confirm dose/schedule in your pharmacy ordering system**  REASON: Disease Progression PRIOR TREATMENT: PANOS91: mFOLFIRINOX q14 Days to a Total of 6 Months of Chemotherapy (Combined Neoadjuvant and Adjuvant) TREATMENT RESPONSE: Unable to Evaluate  START ON PATHWAY REGIMEN - Pancreatic Adenocarcinoma     A cycle is every 28 days:     Nab-paclitaxel (protein bound)      Gemcitabine   **Always confirm dose/schedule in your pharmacy ordering system**  Patient Characteristics: Metastatic Disease, Second Line, MSS/pMMR or MSI Unknown, Fluoropyrimidine-Based Therapy First Line Therapeutic Status: Metastatic Disease Line of Therapy: Second Line Microsatellite/Mismatch Repair Status: MSS/pMMR Intent of Therapy: Non-Curative / Palliative Intent, Discussed with Patient

## 2021-03-02 NOTE — Telephone Encounter (Signed)
Pt will resume taking magnesium as of today per Dr. Benay Spice.

## 2021-03-03 LAB — CANCER ANTIGEN 19-9: CA 19-9: 45 U/mL — ABNORMAL HIGH (ref 0–35)

## 2021-03-06 DIAGNOSIS — H35412 Lattice degeneration of retina, left eye: Secondary | ICD-10-CM | POA: Diagnosis not present

## 2021-03-06 DIAGNOSIS — H43813 Vitreous degeneration, bilateral: Secondary | ICD-10-CM | POA: Diagnosis not present

## 2021-03-06 DIAGNOSIS — H43391 Other vitreous opacities, right eye: Secondary | ICD-10-CM | POA: Diagnosis not present

## 2021-03-06 DIAGNOSIS — H31091 Other chorioretinal scars, right eye: Secondary | ICD-10-CM | POA: Diagnosis not present

## 2021-03-15 ENCOUNTER — Other Ambulatory Visit: Payer: Self-pay

## 2021-03-15 ENCOUNTER — Inpatient Hospital Stay: Payer: BC Managed Care – PPO

## 2021-03-15 ENCOUNTER — Inpatient Hospital Stay: Payer: BC Managed Care – PPO | Attending: Genetic Counselor

## 2021-03-15 VITALS — BP 119/84 | HR 68 | Temp 97.5°F | Resp 18

## 2021-03-15 DIAGNOSIS — I10 Essential (primary) hypertension: Secondary | ICD-10-CM | POA: Insufficient documentation

## 2021-03-15 DIAGNOSIS — Z923 Personal history of irradiation: Secondary | ICD-10-CM | POA: Insufficient documentation

## 2021-03-15 DIAGNOSIS — E1140 Type 2 diabetes mellitus with diabetic neuropathy, unspecified: Secondary | ICD-10-CM | POA: Insufficient documentation

## 2021-03-15 DIAGNOSIS — Z79899 Other long term (current) drug therapy: Secondary | ICD-10-CM | POA: Insufficient documentation

## 2021-03-15 DIAGNOSIS — R918 Other nonspecific abnormal finding of lung field: Secondary | ICD-10-CM | POA: Insufficient documentation

## 2021-03-15 DIAGNOSIS — Z905 Acquired absence of kidney: Secondary | ICD-10-CM | POA: Diagnosis not present

## 2021-03-15 DIAGNOSIS — C250 Malignant neoplasm of head of pancreas: Secondary | ICD-10-CM

## 2021-03-15 DIAGNOSIS — Z95828 Presence of other vascular implants and grafts: Secondary | ICD-10-CM

## 2021-03-15 DIAGNOSIS — Z85528 Personal history of other malignant neoplasm of kidney: Secondary | ICD-10-CM | POA: Insufficient documentation

## 2021-03-15 DIAGNOSIS — N6321 Unspecified lump in the left breast, upper outer quadrant: Secondary | ICD-10-CM | POA: Insufficient documentation

## 2021-03-15 LAB — BASIC METABOLIC PANEL - CANCER CENTER ONLY
Anion gap: 10 (ref 5–15)
BUN: 18 mg/dL (ref 6–20)
CO2: 27 mmol/L (ref 22–32)
Calcium: 9.5 mg/dL (ref 8.9–10.3)
Chloride: 101 mmol/L (ref 98–111)
Creatinine: 0.91 mg/dL (ref 0.44–1.00)
GFR, Estimated: >60 mL/min (ref >60)
Glucose, Bld: 154 mg/dL — ABNORMAL HIGH (ref 70–99)
Potassium: 2.9 mmol/L — ABNORMAL LOW (ref 3.5–5.1)
Sodium: 138 mmol/L (ref 135–145)

## 2021-03-15 LAB — MAGNESIUM: Magnesium: 1.7 mg/dL (ref 1.7–2.4)

## 2021-03-15 LAB — ACID FAST CULTURE WITH REFLEXED SENSITIVITIES (MYCOBACTERIA): Acid Fast Culture: NEGATIVE

## 2021-03-15 MED ORDER — SODIUM CHLORIDE 0.9% FLUSH
10.0000 mL | Freq: Once | INTRAVENOUS | Status: AC
  Administered 2021-03-15: 10 mL

## 2021-03-15 MED ORDER — HEPARIN SOD (PORK) LOCK FLUSH 100 UNIT/ML IV SOLN
500.0000 [IU] | Freq: Once | INTRAVENOUS | Status: AC
  Administered 2021-03-15: 500 [IU]

## 2021-03-15 NOTE — Patient Instructions (Signed)
Implanted Port Home Guide An implanted port is a device that is placed under the skin. It is usually placed in the chest. The device can be used to give IV medicine, to take blood, or for dialysis. You may have an implanted port if: You need IV medicine that would be irritating to the small veins in your hands or arms. You need IV medicines, such as antibiotics, for a long period of time. You need IV nutrition for a long period of time. You need dialysis. When you have a port, your health care provider can choose to use the port instead of veins in your arms for these procedures. You may have fewer limitations when using a port than you would if you used other types of long-term IVs, and you will likely be able to return to normal activities after your incision heals. An implanted port has two main parts: Reservoir. The reservoir is the part where a needle is inserted to give medicines or draw blood. The reservoir is round. After it is placed, it appears as a small, raised area under your skin. Catheter. The catheter is a thin, flexible tube that connects the reservoir to a vein. Medicine that is inserted into the reservoir goes into the catheter and then into the vein. How is my port accessed? To access your port: A numbing cream may be placed on the skin over the port site. Your health care provider will put on a mask and sterile gloves. The skin over your port will be cleaned carefully with a germ-killing soap and allowed to dry. Your health care provider will gently pinch the port and insert a needle into it. Your health care provider will check for a blood return to make sure the port is in the vein and is not clogged. If your port needs to remain accessed to get medicine continuously (constant infusion), your health care provider will place a clear bandage (dressing) over the needle site. The dressing and needle will need to be changed every week, or as told by your health care provider. What  is flushing? Flushing helps keep the port from getting clogged. Follow instructions from your health care provider about how and when to flush the port. Ports are usually flushed with saline solution or a medicine called heparin. The need for flushing will depend on how the port is used: If the port is only used from time to time to give medicines or draw blood, the port may need to be flushed: Before and after medicines have been given. Before and after blood has been drawn. As part of routine maintenance. Flushing may be recommended every 4-6 weeks. If a constant infusion is running, the port may not need to be flushed. Throw away any syringes in a disposal container that is meant for sharp items (sharps container). You can buy a sharps container from a pharmacy, or you can make one by using an empty hard plastic bottle with a cover. How long will my port stay implanted? The port can stay in for as long as your health care provider thinks it is needed. When it is time for the port to come out, a surgery will be done to remove it. The surgery will be similar to the procedure that was done to put the port in. Follow these instructions at home:  Flush your port as told by your health care provider. If you need an infusion over several days, follow instructions from your health care provider about how   to take care of your port site. Make sure you: Wash your hands with soap and water before you change your dressing. If soap and water are not available, use alcohol-based hand sanitizer. Change your dressing as told by your health care provider. Place any used dressings or infusion bags into a plastic bag. Throw that bag in the trash. Keep the dressing that covers the needle clean and dry. Do not get it wet. Do not use scissors or sharp objects near the tube. Keep the tube clamped, unless it is being used. Check your port site every day for signs of infection. Check for: Redness, swelling, or  pain. Fluid or blood. Pus or a bad smell. Protect the skin around the port site. Avoid wearing bra straps that rub or irritate the site. Protect the skin around your port from seat belts. Place a soft pad over your chest if needed. Bathe or shower as told by your health care provider. The site may get wet as long as you are not actively receiving an infusion. Return to your normal activities as told by your health care provider. Ask your health care provider what activities are safe for you. Carry a medical alert card or wear a medical alert bracelet at all times. This will let health care providers know that you have an implanted port in case of an emergency. Get help right away if: You have redness, swelling, or pain at the port site. You have fluid or blood coming from your port site. You have pus or a bad smell coming from the port site. You have a fever. Summary Implanted ports are usually placed in the chest for long-term IV access. Follow instructions from your health care provider about flushing the port and changing bandages (dressings). Take care of the area around your port by avoiding clothing that puts pressure on the area, and by watching for signs of infection. Protect the skin around your port from seat belts. Place a soft pad over your chest if needed. Get help right away if you have a fever or you have redness, swelling, pain, drainage, or a bad smell at the port site. This information is not intended to replace advice given to you by your health care provider. Make sure you discuss any questions you have with your health care provider. Document Revised: 08/16/2020 Document Reviewed: 10/11/2019 Elsevier Patient Education  2022 [employer]  

## 2021-03-16 ENCOUNTER — Telehealth: Payer: Self-pay

## 2021-03-16 ENCOUNTER — Encounter: Payer: Self-pay | Admitting: Oncology

## 2021-03-16 NOTE — Telephone Encounter (Signed)
Pt called no answer message left to please return call for detailed message regarding lab results and medication change at time of note still awaiting return call

## 2021-03-16 NOTE — Telephone Encounter (Signed)
-----   Message from Ladell Pier, MD sent at 03/15/2021  5:41 PM EDT ----- Please call patient, the potassium is still low, increase the potassium chloride to 40 mEq twice daily, repeat potassium and magnesium next visit

## 2021-03-16 NOTE — Telephone Encounter (Signed)
Pt returned call and was made aware of most recent labs and provider instruction to increase potassium to 2 tabs QD as directed pt understands

## 2021-03-20 ENCOUNTER — Encounter: Payer: Self-pay | Admitting: Oncology

## 2021-03-21 ENCOUNTER — Other Ambulatory Visit: Payer: Self-pay

## 2021-03-21 ENCOUNTER — Ambulatory Visit (HOSPITAL_BASED_OUTPATIENT_CLINIC_OR_DEPARTMENT_OTHER)
Admission: RE | Admit: 2021-03-21 | Discharge: 2021-03-21 | Disposition: A | Discharge status: Home / Self Care | Payer: BC Managed Care – PPO | Source: Ambulatory Visit | Attending: Oncology | Admitting: Oncology

## 2021-03-21 DIAGNOSIS — C250 Malignant neoplasm of head of pancreas: Secondary | ICD-10-CM | POA: Diagnosis not present

## 2021-03-21 DIAGNOSIS — I700 Atherosclerosis of aorta: Secondary | ICD-10-CM | POA: Diagnosis not present

## 2021-03-21 DIAGNOSIS — R911 Solitary pulmonary nodule: Secondary | ICD-10-CM | POA: Diagnosis not present

## 2021-03-21 DIAGNOSIS — R918 Other nonspecific abnormal finding of lung field: Secondary | ICD-10-CM | POA: Diagnosis not present

## 2021-03-24 ENCOUNTER — Other Ambulatory Visit: Payer: Self-pay | Admitting: Oncology

## 2021-03-27 ENCOUNTER — Encounter: Payer: Self-pay | Admitting: *Deleted

## 2021-03-27 NOTE — Progress Notes (Signed)
FMLA form for [employer] [facility] [employer] [facility] Deferment Request completed. Copy to HIM to scan and will return completed forms to patient at 10/19 visit.

## 2021-03-28 ENCOUNTER — Encounter: Payer: Self-pay | Admitting: Nurse Practitioner

## 2021-03-28 ENCOUNTER — Telehealth: Payer: Self-pay

## 2021-03-28 ENCOUNTER — Other Ambulatory Visit: Payer: Self-pay

## 2021-03-28 ENCOUNTER — Inpatient Hospital Stay: Payer: BC Managed Care – PPO

## 2021-03-28 ENCOUNTER — Inpatient Hospital Stay (HOSPITAL_BASED_OUTPATIENT_CLINIC_OR_DEPARTMENT_OTHER): Payer: BC Managed Care – PPO | Admitting: Nurse Practitioner

## 2021-03-28 VITALS — BP 138/89 | HR 79 | Temp 97.8°F | Resp 18 | Ht 68.0 in | Wt 194.8 lb

## 2021-03-28 DIAGNOSIS — Z905 Acquired absence of kidney: Secondary | ICD-10-CM | POA: Diagnosis not present

## 2021-03-28 DIAGNOSIS — C250 Malignant neoplasm of head of pancreas: Secondary | ICD-10-CM | POA: Diagnosis not present

## 2021-03-28 DIAGNOSIS — I10 Essential (primary) hypertension: Secondary | ICD-10-CM | POA: Diagnosis not present

## 2021-03-28 DIAGNOSIS — N6321 Unspecified lump in the left breast, upper outer quadrant: Secondary | ICD-10-CM | POA: Diagnosis not present

## 2021-03-28 DIAGNOSIS — Z79899 Other long term (current) drug therapy: Secondary | ICD-10-CM | POA: Diagnosis not present

## 2021-03-28 DIAGNOSIS — Z923 Personal history of irradiation: Secondary | ICD-10-CM | POA: Diagnosis not present

## 2021-03-28 DIAGNOSIS — Z85528 Personal history of other malignant neoplasm of kidney: Secondary | ICD-10-CM | POA: Diagnosis not present

## 2021-03-28 DIAGNOSIS — E1140 Type 2 diabetes mellitus with diabetic neuropathy, unspecified: Secondary | ICD-10-CM | POA: Diagnosis not present

## 2021-03-28 DIAGNOSIS — R918 Other nonspecific abnormal finding of lung field: Secondary | ICD-10-CM | POA: Diagnosis not present

## 2021-03-28 LAB — CBC WITH DIFFERENTIAL (CANCER CENTER ONLY)
Abs Immature Granulocytes: 0.01 10*3/uL (ref 0.00–0.07)
Basophils Absolute: 0 10*3/uL (ref 0.0–0.1)
Basophils Relative: 1 %
Eosinophils Absolute: 0.1 10*3/uL (ref 0.0–0.5)
Eosinophils Relative: 2 %
HCT: 34.2 % — ABNORMAL LOW (ref 36.0–46.0)
Hemoglobin: 11.5 g/dL — ABNORMAL LOW (ref 12.0–15.0)
Immature Granulocytes: 0 %
Lymphocytes Relative: 41 %
Lymphs Abs: 1.8 10*3/uL (ref 0.7–4.0)
MCH: 30.7 pg (ref 26.0–34.0)
MCHC: 33.6 g/dL (ref 30.0–36.0)
MCV: 91.2 fL (ref 80.0–100.0)
Monocytes Absolute: 0.3 10*3/uL (ref 0.1–1.0)
Monocytes Relative: 7 %
Neutro Abs: 2.1 10*3/uL (ref 1.7–7.7)
Neutrophils Relative %: 49 %
Platelet Count: 283 10*3/uL (ref 150–400)
RBC: 3.75 MIL/uL — ABNORMAL LOW (ref 3.87–5.11)
RDW: 12 % (ref 11.5–15.5)
WBC Count: 4.3 10*3/uL (ref 4.0–10.5)
nRBC: 0 % (ref 0.0–0.2)

## 2021-03-28 LAB — CMP (CANCER CENTER ONLY)
ALT: 27 U/L (ref 0–44)
AST: 20 U/L (ref 15–41)
Albumin: 4.1 g/dL (ref 3.5–5.0)
Alkaline Phosphatase: 83 U/L (ref 38–126)
Anion gap: 8 (ref 5–15)
BUN: 11 mg/dL (ref 6–20)
CO2: 27 mmol/L (ref 22–32)
Calcium: 9.5 mg/dL (ref 8.9–10.3)
Chloride: 105 mmol/L (ref 98–111)
Creatinine: 1.07 mg/dL — ABNORMAL HIGH (ref 0.44–1.00)
GFR, Estimated: >60 mL/min (ref >60)
Glucose, Bld: 109 mg/dL — ABNORMAL HIGH (ref 70–99)
Potassium: 2.8 mmol/L — ABNORMAL LOW (ref 3.5–5.1)
Sodium: 140 mmol/L (ref 135–145)
Total Bilirubin: 0.7 mg/dL (ref 0.3–1.2)
Total Protein: 7 g/dL (ref 6.5–8.1)

## 2021-03-28 LAB — MAGNESIUM: Magnesium: 1.6 mg/dL — ABNORMAL LOW (ref 1.7–2.4)

## 2021-03-28 MED ORDER — PROCHLORPERAZINE MALEATE 10 MG PO TABS
10.0000 mg | ORAL_TABLET | Freq: Once | ORAL | Status: AC
  Administered 2021-03-28: 10 mg via ORAL
  Filled 2021-03-28: qty 1

## 2021-03-28 MED ORDER — SODIUM CHLORIDE 0.9% FLUSH
10.0000 mL | INTRAVENOUS | Status: DC | PRN
  Administered 2021-03-28: 10 mL

## 2021-03-28 MED ORDER — PACLITAXEL PROTEIN-BOUND CHEMO INJECTION 100 MG
100.0000 mg/m2 | Freq: Once | INTRAVENOUS | Status: AC
  Administered 2021-03-28: 200 mg via INTRAVENOUS
  Filled 2021-03-28: qty 40

## 2021-03-28 MED ORDER — SODIUM CHLORIDE 0.9 % IV SOLN: Freq: Once | INTRAVENOUS | Status: AC

## 2021-03-28 MED ORDER — MAGNESIUM OXIDE -MG SUPPLEMENT 400 (240 MG) MG PO TABS: 400.0000 mg | ORAL_TABLET | Freq: Three times a day (TID) | ORAL | 0 refills | Status: DC

## 2021-03-28 MED ORDER — SODIUM CHLORIDE 0.9 % IV SOLN
1000.0000 mg/m2 | Freq: Once | INTRAVENOUS | Status: AC
  Administered 2021-03-28: 2052 mg via INTRAVENOUS
  Filled 2021-03-28: qty 5.26

## 2021-03-28 MED ORDER — HEPARIN SOD (PORK) LOCK FLUSH 100 UNIT/ML IV SOLN
500.0000 [IU] | Freq: Once | INTRAVENOUS | Status: AC | PRN
  Administered 2021-03-28: 500 [IU]

## 2021-03-28 MED ORDER — PROCHLORPERAZINE MALEATE 10 MG PO TABS
10.0000 mg | ORAL_TABLET | Freq: Four times a day (QID) | ORAL | 1 refills | Status: DC | PRN
Start: 2021-03-28 — End: 2021-04-09

## 2021-03-28 NOTE — Progress Notes (Signed)
[place] OFFICE PROGRESS NOTE   Diagnosis: Pancreas cancer  INTERVAL HISTORY:   Alyssa Ross returns as scheduled.  She denies nausea/vomiting.  Bowels are moving regularly.  No shortness of breath.  No cough.  No fever.  She has intermittent numbness/tingling in the feet.  Objective:  Vital signs in last 24 hours:  Blood pressure 138/89, pulse 79, temperature 97.8 F (36.6 C), temperature source Oral, resp. rate 18, height 5' 8"  (1.727 m), weight 194 lb 12.8 oz (88.4 kg), last menstrual period 09/17/2012, SpO2 100 %.    HEENT: No thrush or ulcers. Resp: Lungs clear bilaterally. Cardio: Regular rate and rhythm. GI: Abdomen soft and nontender.  No hepatomegaly. Vascular: No leg edema. Port-A-Cath without erythema.  Lab Results:  Lab Results  Component Value Date   WBC 4.3 03/28/2021   HGB 11.5 (L) 03/28/2021   HCT 34.2 (L) 03/28/2021   MCV 91.2 03/28/2021   PLT 283 03/28/2021   NEUTROABS 2.1 03/28/2021    Imaging:  No results found.  Medications: I have reviewed the patient's current medications.  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 Tumor invades the duodenal wall and vascular groove, resection margins negative, 4/34 lymph nodes positive MSI-stable, tumor showed instability in 2 loci as did adjacent normal tissue EUS FNA biopsy of pancreas mass on 07/03/2018-well-differentiated neuroendocrine tumor CTs 01/29/2019-ill-defined pancreas head mass, 5 pulmonary nodules-1 with a small amount of central cavitation, tumor abuts the left margin of the portal vein indistinct density surrounding, hepatic artery, complex cystic lesion of the right kidney, right adrenal mass-characterized as an adenoma on a [facility] MRI 12/21/2018 [facility] 03/03/2019-no focal pancreas activity, no tracer accumulation within the suspicious pulmonary nodules, 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  Dominant cavitary lesions in both lungs show increased cavitation with thinner walls.  Stable 2.1 cm right adrenal nodule. 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 No definite evidence of metastatic disease in the abdomen or pelvis. Markedly enlarged and heterogeneous appearing uterus, likely to represent multifocal fibroids. 1 of these lesions appears to be an exophytic subserosal fibroid in the posterior lateral aspect of the uterine body on the left side although this comes in close proximity to the left adnexa such that a primary ovarian lesion is difficult to completely exclude. CTs 02/07/2020-slight enlargement of bilateral lung nodules, some are cavitary, no evidence of metastatic disease in the abdomen or pelvis, stable right adrenal nodule, uterine fibroids CTs 04/26/2020-mild enlargement of pulmonary nodules, slight increase in trace pelvic fluid, new soft tissue thickening inferior to the cecal tip suspicious for peritoneal metastasis CT 05/26/2020-improved appearance of soft tissue at the inferior tip of the cecum, mildly thickened short appendix-findings suggestive of resolving appendicitis, stable  small bibasilar pulmonary nodules, fibroids Plan biopsy of right cecal tip soft tissue canceled secondary to radiologic improvement CT chest 08/02/2020-enlargement  and progressive cavitation of multiple bilateral lung nodules.  Some new nodules are present. CTs 10/24/2020- increase in size of pulmonary nodules, no new nodules, no evidence of metastatic disease in the abdomen, stable right adrenal nodule CT 01/09/2021-slight interval enlargement of pulmonary nodules, stable right adrenal nodule Navigation bronchoscopy 01/30/2021-left lower lobe cavitary nodule FNA-adenocarcinoma, brushing-adenocarcinoma.  Left lower lobe lavage-adenocarcinoma.  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  She is scheduled to begin treatment today with gemcitabine/Abraxane.  Potential toxicities again reviewed.  She agrees to proceed.  We reviewed the CBC from today.  Counts adequate to proceed with treatment.  She will return for lab, follow-up, cycle 2 gemcitabine/Abraxane in 2 weeks.  She will contact the office in the interim with any problems.    Ned Card ANP/GNP-BC   03/28/2021  8:56 AM

## 2021-03-28 NOTE — Patient Instructions (Signed)
Alyssa Ross  Discharge Instructions: Thank you for choosing [facility] to provide your oncology and hematology care.   If you have a lab appointment with the [name], please go directly to the [facility] and check in at the registration area.   Wear comfortable clothing and clothing appropriate for easy access to any Portacath or PICC line.   We strive to give you quality time with your provider. You may need to reschedule your appointment if you arrive late (15 or more minutes).  Arriving late affects you and other patients whose appointments are after yours.  Also, if you miss three or more appointments without notifying the office, you may be dismissed from the clinic at the provider's discretion.      For prescription refill requests, have your pharmacy contact our office and allow 72 hours for refills to be completed.    Today you received the following chemotherapy and/or immunotherapy agents Abraxane, Gemzar      To help prevent nausea and vomiting after your treatment, we encourage you to take your nausea medication as directed.  BELOW ARE SYMPTOMS THAT SHOULD BE REPORTED IMMEDIATELY: *FEVER GREATER THAN 100.4 F (38 C) OR HIGHER *CHILLS OR SWEATING *NAUSEA AND VOMITING THAT IS NOT CONTROLLED WITH YOUR NAUSEA MEDICATION *UNUSUAL SHORTNESS OF BREATH *UNUSUAL BRUISING OR BLEEDING *URINARY PROBLEMS (pain or burning when urinating, or frequent urination) *BOWEL PROBLEMS (unusual diarrhea, constipation, pain near the anus) TENDERNESS IN MOUTH AND THROAT WITH OR WITHOUT PRESENCE OF ULCERS (sore throat, sores in mouth, or a toothache) UNUSUAL RASH, SWELLING OR PAIN  UNUSUAL VAGINAL DISCHARGE OR ITCHING   Items with * indicate a potential emergency and should be followed up as soon as possible or go to the Emergency Department if any problems should occur.  Please show the CHEMOTHERAPY ALERT CARD or IMMUNOTHERAPY ALERT CARD at check-in  to the Emergency Department and triage nurse.  Should you have questions after your visit or need to cancel or reschedule your appointment, please contact [name]  Dept: [phone]  and follow the prompts.  Office hours are 8:00 a.m. to 4:30 p.m. Monday - Friday. Please note that voicemails left after 4:00 p.m. may not be returned until the following business day.  We are closed weekends and major holidays. You have access to a nurse at all times for urgent questions. Please call the main number to the clinic Dept: [phone] and follow the prompts.   For any non-urgent questions, you may also contact your provider using MyChart. We now offer e-Visits for anyone 35 and older to request care online for non-urgent symptoms. For details visit [email or web address].   Also download the MyChart app! Go to the app store, search "MyChart", open the app, select [name], and log in with your MyChart username and password.  Due to Covid, a mask is required upon entering the hospital/clinic. If you do not have a mask, one will be given to you upon arrival. For doctor visits, patients may have 1 support person aged 91 or older with them. For treatment visits, patients cannot have anyone with them due to current Covid guidelines and our immunocompromised population.   Nanoparticle Albumin-Bound Paclitaxel injection What is this medication? NANOPARTICLE ALBUMIN-BOUND PACLITAXEL (Na no PAHR ti kuhl al BYOO muhn-bound PAK li TAX el) is a chemotherapy drug. It targets fast dividing cells, like cancer cells, and causes these cells to die. This medicine is used to treat advanced  breast cancer, lung cancer, and pancreatic cancer. This medicine may be used for other purposes; ask your health care provider or pharmacist if you have questions. COMMON BRAND NAME(S): Abraxane What should I tell my care team before I take this medication? They need to know if you have any of these  conditions: kidney disease liver disease low blood counts, like low white cell, platelet, or red cell counts lung or breathing disease, like asthma tingling of the fingers or toes, or other nerve disorder an unusual or allergic reaction to paclitaxel, albumin, other chemotherapy, other medicines, foods, dyes, or preservatives pregnant or trying to get pregnant breast-feeding How should I use this medication? This drug is given as an infusion into a vein. It is administered in a hospital or clinic by a specially trained health care professional. Talk to your pediatrician regarding the use of this medicine in children. Special care may be needed. Overdosage: If you think you have taken too much of this medicine contact a poison control center or emergency room at once. NOTE: This medicine is only for you. Do not share this medicine with others. What if I miss a dose? It is important not to miss your dose. Call your doctor or health care professional if you are unable to keep an appointment. What may interact with this medication? This medicine may interact with the following medications: antiviral medicines for hepatitis, HIV or AIDS certain antibiotics like erythromycin and clarithromycin certain medicines for fungal infections like ketoconazole and itraconazole certain medicines for seizures like carbamazepine, phenobarbital, phenytoin gemfibrozil nefazodone rifampin St. John's wort This list may not describe all possible interactions. Give your health care provider a list of all the medicines, herbs, non-prescription drugs, or dietary supplements you use. Also tell them if you smoke, drink alcohol, or use illegal drugs. Some items may interact with your medicine. What should I watch for while using this medication? Your condition will be monitored carefully while you are receiving this medicine. You will need important blood work done while you are taking this medicine. This medicine  can cause serious allergic reactions. If you experience allergic reactions like skin rash, itching or hives, swelling of the face, lips, or tongue, tell your doctor or health care professional right away. In some cases, you may be given additional medicines to help with side effects. Follow all directions for their use. This drug may make you feel generally unwell. This is not uncommon, as chemotherapy can affect healthy cells as well as cancer cells. Report any side effects. Continue your course of treatment even though you feel ill unless your doctor tells you to stop. Call your doctor or health care professional for advice if you get a fever, chills or sore throat, or other symptoms of a cold or flu. Do not treat yourself. This drug decreases your body's ability to fight infections. Try to avoid being around people who are sick. This medicine may increase your risk to bruise or bleed. Call your doctor or health care professional if you notice any unusual bleeding. Be careful brushing and flossing your teeth or using a toothpick because you may get an infection or bleed more easily. If you have any dental work done, tell your dentist you are receiving this medicine. Avoid taking products that contain aspirin, acetaminophen, ibuprofen, naproxen, or ketoprofen unless instructed by your doctor. These medicines may hide a fever. Do not become pregnant while taking this medicine or for 6 months after stopping it. Women should inform their doctor  if they wish to become pregnant or think they might be pregnant. Men should not father a child while taking this medicine or for 3 months after stopping it. There is a potential for serious side effects to an unborn child. Talk to your health care professional or pharmacist for more information. Do not breast-feed an infant while taking this medicine or for 2 weeks after stopping it. This medicine may interfere with the ability to get pregnant or to father a child. You  should talk to your doctor or health care professional if you are concerned about your fertility. What side effects may I notice from receiving this medication? Side effects that you should report to your doctor or health care professional as soon as possible: allergic reactions like skin rash, itching or hives, swelling of the face, lips, or tongue breathing problems changes in vision fast, irregular heartbeat low blood pressure mouth sores pain, tingling, numbness in the hands or feet signs of decreased platelets or bleeding - bruising, pinpoint red spots on the skin, black, tarry stools, blood in the urine signs of decreased red blood cells - unusually weak or tired, feeling faint or lightheaded, falls signs of infection - fever or chills, cough, sore throat, pain or difficulty passing urine signs and symptoms of liver injury like dark yellow or brown urine; general ill feeling or flu-like symptoms; light-colored stools; loss of appetite; nausea; right upper belly pain; unusually weak or tired; yellowing of the eyes or skin swelling of the ankles, feet, hands unusually slow heartbeat Side effects that usually do not require medical attention (report to your doctor or health care professional if they continue or are bothersome): diarrhea hair loss loss of appetite nausea, vomiting tiredness This list may not describe all possible side effects. Call your doctor for medical advice about side effects. You may report side effects to FDA at 1-800-FDA-1088. Where should I keep my medication? This drug is given in a hospital or clinic and will not be stored at home. NOTE: This sheet is a summary. It may not cover all possible information. If you have questions about this medicine, talk to your doctor, pharmacist, or health care provider.  2022 Elsevier/Gold Standard (2017-01-28 13:03:45)  Gemcitabine injection What is this medication? GEMCITABINE (jem SYE ta been) is a chemotherapy drug.  This medicine is used to treat many types of cancer like breast cancer, lung cancer, pancreatic cancer, and ovarian cancer. This medicine may be used for other purposes; ask your health care provider or pharmacist if you have questions. COMMON BRAND NAME(S): Gemzar, Infugem What should I tell my care team before I take this medication? They need to know if you have any of these conditions: blood disorders infection kidney disease liver disease lung or breathing disease, like asthma recent or ongoing radiation therapy an unusual or allergic reaction to gemcitabine, other chemotherapy, other medicines, foods, dyes, or preservatives pregnant or trying to get pregnant breast-feeding How should I use this medication? This drug is given as an infusion into a vein. It is administered in a hospital or clinic by a specially trained health care professional. Talk to your pediatrician regarding the use of this medicine in children. Special care may be needed. Overdosage: If you think you have taken too much of this medicine contact a poison control center or emergency room at once. NOTE: This medicine is only for you. Do not share this medicine with others. What if I miss a dose? It is important not to miss your  dose. Call your doctor or health care professional if you are unable to keep an appointment. What may interact with this medication? medicines to increase blood counts like filgrastim, pegfilgrastim, sargramostim some other chemotherapy drugs like cisplatin vaccines Talk to your doctor or health care professional before taking any of these medicines: acetaminophen aspirin ibuprofen ketoprofen naproxen This list may not describe all possible interactions. Give your health care provider a list of all the medicines, herbs, non-prescription drugs, or dietary supplements you use. Also tell them if you smoke, drink alcohol, or use illegal drugs. Some items may interact with your medicine. What  should I watch for while using this medication? Visit your doctor for checks on your progress. This drug may make you feel generally unwell. This is not uncommon, as chemotherapy can affect healthy cells as well as cancer cells. Report any side effects. Continue your course of treatment even though you feel ill unless your doctor tells you to stop. In some cases, you may be given additional medicines to help with side effects. Follow all directions for their use. Call your doctor or health care professional for advice if you get a fever, chills or sore throat, or other symptoms of a cold or flu. Do not treat yourself. This drug decreases your body's ability to fight infections. Try to avoid being around people who are sick. This medicine may increase your risk to bruise or bleed. Call your doctor or health care professional if you notice any unusual bleeding. Be careful brushing and flossing your teeth or using a toothpick because you may get an infection or bleed more easily. If you have any dental work done, tell your dentist you are receiving this medicine. Avoid taking products that contain aspirin, acetaminophen, ibuprofen, naproxen, or ketoprofen unless instructed by your doctor. These medicines may hide a fever. Do not become pregnant while taking this medicine or for 6 months after stopping it. Women should inform their doctor if they wish to become pregnant or think they might be pregnant. Men should not father a child while taking this medicine and for 3 months after stopping it. There is a potential for serious side effects to an unborn child. Talk to your health care professional or pharmacist for more information. Do not breast-feed an infant while taking this medicine or for at least 1 week after stopping it. Men should inform their doctors if they wish to father a child. This medicine may lower sperm counts. Talk with your doctor or health care professional if you are concerned about your  fertility. What side effects may I notice from receiving this medication? Side effects that you should report to your doctor or health care professional as soon as possible: allergic reactions like skin rash, itching or hives, swelling of the face, lips, or tongue breathing problems pain, redness, or irritation at site where injected signs and symptoms of a dangerous change in heartbeat or heart rhythm like chest pain; dizziness; fast or irregular heartbeat; palpitations; feeling faint or lightheaded, falls; breathing problems signs of decreased platelets or bleeding - bruising, pinpoint red spots on the skin, black, tarry stools, blood in the urine signs of decreased red blood cells - unusually weak or tired, feeling faint or lightheaded, falls signs of infection - fever or chills, cough, sore throat, pain or difficulty passing urine signs and symptoms of kidney injury like trouble passing urine or change in the amount of urine signs and symptoms of liver injury like dark yellow or brown urine; general  ill feeling or flu-like symptoms; light-colored stools; loss of appetite; nausea; right upper belly pain; unusually weak or tired; yellowing of the eyes or skin swelling of ankles, feet, hands Side effects that usually do not require medical attention (report to your doctor or health care professional if they continue or are bothersome): constipation diarrhea hair loss loss of appetite nausea rash vomiting This list may not describe all possible side effects. Call your doctor for medical advice about side effects. You may report side effects to FDA at 1-800-FDA-1088. Where should I keep my medication? This drug is given in a hospital or clinic and will not be stored at home. NOTE: This sheet is a summary. It may not cover all possible information. If you have questions about this medicine, talk to your doctor, pharmacist, or health care provider.  2022 Elsevier/Gold Standard (2017-08-20  18:06:11)

## 2021-03-28 NOTE — Telephone Encounter (Signed)
TC to Pt responded to her my chart message asking if she should continue taking 400 mg of magnesium twice a day. Discussed with Leander Rams NP Pt should increase magnesium to 3 x a day. Informed Pt her magnesium was 1.6 today. Pt verbalized understanding.

## 2021-03-28 NOTE — Progress Notes (Signed)
Patient presents for treatment. RN assessment completed along with the following:  Labs/vitals reviewed - Yes, and within treatment parameters.   Weight within 10% of previous measurement - Yes Oncology Treatment Attestation completed for current therapy- Yes, on date 03/02/21 Informed consent completed and reflects current therapy/intent - Yes, on date 03/28/21             Provider progress note reviewed - Yes, today's provider note was reviewed. Treatment/Antibody/Supportive plan reviewed - Yes, and there are no adjustments needed for today's treatment. S&H and other orders reviewed - Yes, and there are no additional orders identified. Previous treatment date reviewed - Yes, and the appropriate amount of time has elapsed between treatments. Clinic Hand Off Received from - Leander Rams, NP  Patient to proceed with treatment.

## 2021-03-29 ENCOUNTER — Telehealth: Payer: Self-pay | Admitting: Emergency Medicine

## 2021-03-29 LAB — CANCER ANTIGEN 19-9: CA 19-9: 60 U/mL — ABNORMAL HIGH (ref 0–35)

## 2021-03-29 LAB — ACID FAST CULTURE WITH REFLEXED SENSITIVITIES (MYCOBACTERIA): Acid Fast Culture: NEGATIVE

## 2021-03-29 NOTE — Telephone Encounter (Signed)
24 Hour Callback 24 hour callback to patient after 1st time Abraxane and Gemzar. Patient reports an episode of diarrhea yesterday which she believes may have been related to something she ate. No other problems reported. Pt advised to call with any issues

## 2021-04-02 ENCOUNTER — Encounter: Payer: Self-pay | Admitting: *Deleted

## 2021-04-06 LAB — HM DIABETES EYE EXAM

## 2021-04-07 ENCOUNTER — Other Ambulatory Visit: Payer: Self-pay | Admitting: Nurse Practitioner

## 2021-04-07 ENCOUNTER — Other Ambulatory Visit: Payer: Self-pay | Admitting: Oncology

## 2021-04-07 DIAGNOSIS — C250 Malignant neoplasm of head of pancreas: Secondary | ICD-10-CM

## 2021-04-11 ENCOUNTER — Inpatient Hospital Stay: Payer: BC Managed Care – PPO

## 2021-04-11 ENCOUNTER — Other Ambulatory Visit: Payer: Self-pay

## 2021-04-11 ENCOUNTER — Telehealth: Payer: Self-pay

## 2021-04-11 ENCOUNTER — Inpatient Hospital Stay: Payer: BC Managed Care – PPO | Attending: Genetic Counselor | Admitting: Oncology

## 2021-04-11 VITALS — BP 125/88 | HR 61 | Temp 98.1°F | Resp 18 | Ht 68.0 in | Wt 192.0 lb

## 2021-04-11 DIAGNOSIS — D3A8 Other benign neuroendocrine tumors: Secondary | ICD-10-CM

## 2021-04-11 DIAGNOSIS — R918 Other nonspecific abnormal finding of lung field: Secondary | ICD-10-CM | POA: Insufficient documentation

## 2021-04-11 DIAGNOSIS — C250 Malignant neoplasm of head of pancreas: Secondary | ICD-10-CM

## 2021-04-11 DIAGNOSIS — I10 Essential (primary) hypertension: Secondary | ICD-10-CM | POA: Insufficient documentation

## 2021-04-11 DIAGNOSIS — N6321 Unspecified lump in the left breast, upper outer quadrant: Secondary | ICD-10-CM | POA: Insufficient documentation

## 2021-04-11 DIAGNOSIS — Z5111 Encounter for antineoplastic chemotherapy: Secondary | ICD-10-CM | POA: Diagnosis not present

## 2021-04-11 DIAGNOSIS — Z923 Personal history of irradiation: Secondary | ICD-10-CM | POA: Insufficient documentation

## 2021-04-11 DIAGNOSIS — Z23 Encounter for immunization: Secondary | ICD-10-CM | POA: Diagnosis not present

## 2021-04-11 DIAGNOSIS — E1140 Type 2 diabetes mellitus with diabetic neuropathy, unspecified: Secondary | ICD-10-CM | POA: Insufficient documentation

## 2021-04-11 DIAGNOSIS — Z79899 Other long term (current) drug therapy: Secondary | ICD-10-CM | POA: Diagnosis not present

## 2021-04-11 DIAGNOSIS — E876 Hypokalemia: Secondary | ICD-10-CM

## 2021-04-11 LAB — CMP (CANCER CENTER ONLY)
ALT: 38 U/L (ref 0–44)
AST: 30 U/L (ref 15–41)
Albumin: 3.6 g/dL (ref 3.5–5.0)
Alkaline Phosphatase: 76 U/L (ref 38–126)
Anion gap: 7 (ref 5–15)
BUN: 9 mg/dL (ref 6–20)
CO2: 27 mmol/L (ref 22–32)
Calcium: 8.8 mg/dL — ABNORMAL LOW (ref 8.9–10.3)
Chloride: 105 mmol/L (ref 98–111)
Creatinine: 1.06 mg/dL — ABNORMAL HIGH (ref 0.44–1.00)
GFR, Estimated: >60 mL/min (ref >60)
Glucose, Bld: 219 mg/dL — ABNORMAL HIGH (ref 70–99)
Potassium: 3.1 mmol/L — ABNORMAL LOW (ref 3.5–5.1)
Sodium: 139 mmol/L (ref 135–145)
Total Bilirubin: 0.5 mg/dL (ref 0.3–1.2)
Total Protein: 6.7 g/dL (ref 6.5–8.1)

## 2021-04-11 LAB — CBC WITH DIFFERENTIAL (CANCER CENTER ONLY)
Abs Immature Granulocytes: 0.01 10*3/uL (ref 0.00–0.07)
Basophils Absolute: 0 10*3/uL (ref 0.0–0.1)
Basophils Relative: 0 %
Eosinophils Absolute: 0.1 10*3/uL (ref 0.0–0.5)
Eosinophils Relative: 2 %
HCT: 30.1 % — ABNORMAL LOW (ref 36.0–46.0)
Hemoglobin: 10.1 g/dL — ABNORMAL LOW (ref 12.0–15.0)
Immature Granulocytes: 0 %
Lymphocytes Relative: 40 %
Lymphs Abs: 1.6 10*3/uL (ref 0.7–4.0)
MCH: 30.4 pg (ref 26.0–34.0)
MCHC: 33.6 g/dL (ref 30.0–36.0)
MCV: 90.7 fL (ref 80.0–100.0)
Monocytes Absolute: 0.4 10*3/uL (ref 0.1–1.0)
Monocytes Relative: 10 %
Neutro Abs: 1.9 10*3/uL (ref 1.7–7.7)
Neutrophils Relative %: 48 %
Platelet Count: 350 10*3/uL (ref 150–400)
RBC: 3.32 MIL/uL — ABNORMAL LOW (ref 3.87–5.11)
RDW: 11.9 % (ref 11.5–15.5)
WBC Count: 4 10*3/uL (ref 4.0–10.5)
nRBC: 0 % (ref 0.0–0.2)

## 2021-04-11 LAB — MAGNESIUM: Magnesium: 1.6 mg/dL — ABNORMAL LOW (ref 1.7–2.4)

## 2021-04-11 MED ORDER — POTASSIUM CHLORIDE 10 MEQ/100ML IV SOLN
10.0000 meq | INTRAVENOUS | Status: AC
  Administered 2021-04-11 (×2): 10 meq via INTRAVENOUS
  Filled 2021-04-11: qty 100

## 2021-04-11 MED ORDER — SODIUM CHLORIDE 0.9% FLUSH
10.0000 mL | INTRAVENOUS | Status: DC | PRN
  Administered 2021-04-11: 10 mL

## 2021-04-11 MED ORDER — SODIUM CHLORIDE 0.9 % IV SOLN
1000.0000 mg/m2 | Freq: Once | INTRAVENOUS | Status: AC
  Administered 2021-04-11: 2052 mg via INTRAVENOUS
  Filled 2021-04-11: qty 5.26

## 2021-04-11 MED ORDER — PROCHLORPERAZINE MALEATE 10 MG PO TABS
10.0000 mg | ORAL_TABLET | Freq: Once | ORAL | Status: AC
  Administered 2021-04-11: 10 mg via ORAL
  Filled 2021-04-11: qty 1

## 2021-04-11 MED ORDER — POTASSIUM CHLORIDE 10 MEQ/100ML IV SOLN: 10.0000 meq | INTRAVENOUS | Status: DC

## 2021-04-11 MED ORDER — SODIUM CHLORIDE 0.9 % IV SOLN: Freq: Once | INTRAVENOUS | Status: AC

## 2021-04-11 MED ORDER — PACLITAXEL PROTEIN-BOUND CHEMO INJECTION 100 MG
100.0000 mg/m2 | Freq: Once | INTRAVENOUS | Status: AC
  Administered 2021-04-11: 200 mg via INTRAVENOUS
  Filled 2021-04-11: qty 40

## 2021-04-11 MED ORDER — HEPARIN SOD (PORK) LOCK FLUSH 100 UNIT/ML IV SOLN
500.0000 [IU] | Freq: Once | INTRAVENOUS | Status: AC | PRN
  Administered 2021-04-11: 500 [IU]

## 2021-04-11 MED ORDER — MAGNESIUM SULFATE 2 GM/50ML IV SOLN: 2.0000 g | Freq: Once | INTRAVENOUS | Status: DC

## 2021-04-11 MED ORDER — MAGNESIUM SULFATE 2 GM/50ML IV SOLN
2.0000 g | Freq: Once | INTRAVENOUS | Status: AC
  Administered 2021-04-11: 2 g via INTRAVENOUS
  Filled 2021-04-11: qty 50

## 2021-04-11 NOTE — Patient Instructions (Signed)
[facility]  Discharge Instructions: Thank you for choosing [facility] to provide your oncology and hematology care.   If you have a lab appointment with the [facility], please go directly to the [facility] and check in at the registration area.   Wear comfortable clothing and clothing appropriate for easy access to any Portacath or PICC line.   We strive to give you quality time with your provider. You may need to reschedule your appointment if you arrive late (15 or more minutes).  Arriving late affects you and other patients whose appointments are after yours.  Also, if you miss three or more appointments without notifying the office, you may be dismissed from the clinic at the provider's discretion.      For prescription refill requests, have your pharmacy contact our office and allow 72 hours for refills to be completed.    Today you received the following chemotherapy and/or immunotherapy agents Gemzar, abraxane. Potassium and Magnesium       To help prevent nausea and vomiting after your treatment, we encourage you to take your nausea medication as directed.  BELOW ARE SYMPTOMS THAT SHOULD BE REPORTED IMMEDIATELY: *FEVER GREATER THAN 100.4 F (38 C) OR HIGHER *CHILLS OR SWEATING *NAUSEA AND VOMITING THAT IS NOT CONTROLLED WITH YOUR NAUSEA MEDICATION *UNUSUAL SHORTNESS OF BREATH *UNUSUAL BRUISING OR BLEEDING *URINARY PROBLEMS (pain or burning when urinating, or frequent urination) *BOWEL PROBLEMS (unusual diarrhea, constipation, pain near the anus) TENDERNESS IN MOUTH AND THROAT WITH OR WITHOUT PRESENCE OF ULCERS (sore throat, sores in mouth, or a toothache) UNUSUAL RASH, SWELLING OR PAIN  UNUSUAL VAGINAL DISCHARGE OR ITCHING   Items with * indicate a potential emergency and should be followed up as soon as possible or go to the Emergency Department if any problems should occur.  Please show the CHEMOTHERAPY ALERT CARD or  IMMUNOTHERAPY ALERT CARD at check-in to the Emergency Department and triage nurse.  Should you have questions after your visit or need to cancel or reschedule your appointment, please contact [facility]  Dept: [phone]  and follow the prompts.  Office hours are 8:00 a.m. to 4:30 p.m. Monday - Friday. Please note that voicemails left after 4:00 p.m. may not be returned until the following business day.  We are closed weekends and major holidays. You have access to a nurse at all times for urgent questions. Please call the main number to the clinic Dept: [phone] and follow the prompts.   For any non-urgent questions, you may also contact your provider using MyChart. We now offer e-Visits for anyone 8 and older to request care online for non-urgent symptoms. For details visit [email or web address].   Also download the MyChart app! Go to the app store, search "MyChart", open the app, select [facility], and log in with your MyChart username and password.  Due to Covid, a mask is required upon entering the hospital/clinic. If you do not have a mask, one will be given to you upon arrival. For doctor visits, patients may have 1 support person aged 44 or older with them. For treatment visits, patients cannot have anyone with them due to current Covid guidelines and our immunocompromised population.   Gemcitabine injection What is this medication? GEMCITABINE (jem SYE ta been) is a chemotherapy drug. This medicine is used to treat many types of cancer like breast cancer, lung cancer, pancreatic cancer, and ovarian cancer. This medicine may be used for other purposes; ask  your health care provider or pharmacist if you have questions. COMMON BRAND NAME(S): Gemzar, Infugem What should I tell my care team before I take this medication? They need to know if you have any of these conditions: blood disorders infection kidney disease liver disease lung or breathing  disease, like asthma recent or ongoing radiation therapy an unusual or allergic reaction to gemcitabine, other chemotherapy, other medicines, foods, dyes, or preservatives pregnant or trying to get pregnant breast-feeding How should I use this medication? This drug is given as an infusion into a vein. It is administered in a hospital or clinic by a specially trained health care professional. Talk to your pediatrician regarding the use of this medicine in children. Special care may be needed. Overdosage: If you think you have taken too much of this medicine contact a poison control center or emergency room at once. NOTE: This medicine is only for you. Do not share this medicine with others. What if I miss a dose? It is important not to miss your dose. Call your doctor or health care professional if you are unable to keep an appointment. What may interact with this medication? medicines to increase blood counts like filgrastim, pegfilgrastim, sargramostim some other chemotherapy drugs like cisplatin vaccines Talk to your doctor or health care professional before taking any of these medicines: acetaminophen aspirin ibuprofen ketoprofen naproxen This list may not describe all possible interactions. Give your health care provider a list of all the medicines, herbs, non-prescription drugs, or dietary supplements you use. Also tell them if you smoke, drink alcohol, or use illegal drugs. Some items may interact with your medicine. What should I watch for while using this medication? Visit your doctor for checks on your progress. This drug may make you feel generally unwell. This is not uncommon, as chemotherapy can affect healthy cells as well as cancer cells. Report any side effects. Continue your course of treatment even though you feel ill unless your doctor tells you to stop. In some cases, you may be given additional medicines to help with side effects. Follow all directions for their  use. Call your doctor or health care professional for advice if you get a fever, chills or sore throat, or other symptoms of a cold or flu. Do not treat yourself. This drug decreases your body's ability to fight infections. Try to avoid being around people who are sick. This medicine may increase your risk to bruise or bleed. Call your doctor or health care professional if you notice any unusual bleeding. Be careful brushing and flossing your teeth or using a toothpick because you may get an infection or bleed more easily. If you have any dental work done, tell your dentist you are receiving this medicine. Avoid taking products that contain aspirin, acetaminophen, ibuprofen, naproxen, or ketoprofen unless instructed by your doctor. These medicines may hide a fever. Do not become pregnant while taking this medicine or for 6 months after stopping it. Women should inform their doctor if they wish to become pregnant or think they might be pregnant. Men should not father a child while taking this medicine and for 3 months after stopping it. There is a potential for serious side effects to an unborn child. Talk to your health care professional or pharmacist for more information. Do not breast-feed an infant while taking this medicine or for at least 1 week after stopping it. Men should inform their doctors if they wish to father a child. This medicine may lower sperm counts. Talk with  your doctor or health care professional if you are concerned about your fertility. What side effects may I notice from receiving this medication? Side effects that you should report to your doctor or health care professional as soon as possible: allergic reactions like skin rash, itching or hives, swelling of the face, lips, or tongue breathing problems pain, redness, or irritation at site where injected signs and symptoms of a dangerous change in heartbeat or heart rhythm like chest pain; dizziness; fast or irregular heartbeat;  palpitations; feeling faint or lightheaded, falls; breathing problems signs of decreased platelets or bleeding - bruising, pinpoint red spots on the skin, black, tarry stools, blood in the urine signs of decreased red blood cells - unusually weak or tired, feeling faint or lightheaded, falls signs of infection - fever or chills, cough, sore throat, pain or difficulty passing urine signs and symptoms of kidney injury like trouble passing urine or change in the amount of urine signs and symptoms of liver injury like dark yellow or brown urine; general ill feeling or flu-like symptoms; light-colored stools; loss of appetite; nausea; right upper belly pain; unusually weak or tired; yellowing of the eyes or skin swelling of ankles, feet, hands Side effects that usually do not require medical attention (report to your doctor or health care professional if they continue or are bothersome): constipation diarrhea hair loss loss of appetite nausea rash vomiting This list may not describe all possible side effects. Call your doctor for medical advice about side effects. You may report side effects to FDA at 1-800-FDA-1088. Where should I keep my medication? This drug is given in a hospital or clinic and will not be stored at home. NOTE: This sheet is a summary. It may not cover all possible information. If you have questions about this medicine, talk to your doctor, pharmacist, or health care provider.  2022 Elsevier/Gold Standard (2017-08-20 18:06:11)  Nanoparticle Albumin-Bound Paclitaxel injection What is this medication? NANOPARTICLE ALBUMIN-BOUND PACLITAXEL (Na no PAHR ti kuhl al BYOO muhn-bound PAK li TAX el) is a chemotherapy drug. It targets fast dividing cells, like cancer cells, and causes these cells to die. This medicine is used to treat advanced breast cancer, lung cancer, and pancreatic cancer. This medicine may be used for other purposes; ask your health care provider or pharmacist if  you have questions. COMMON BRAND NAME(S): Abraxane What should I tell my care team before I take this medication? They need to know if you have any of these conditions: kidney disease liver disease low blood counts, like low white cell, platelet, or red cell counts lung or breathing disease, like asthma tingling of the fingers or toes, or other nerve disorder an unusual or allergic reaction to paclitaxel, albumin, other chemotherapy, other medicines, foods, dyes, or preservatives pregnant or trying to get pregnant breast-feeding How should I use this medication? This drug is given as an infusion into a vein. It is administered in a hospital or clinic by a specially trained health care professional. Talk to your pediatrician regarding the use of this medicine in children. Special care may be needed. Overdosage: If you think you have taken too much of this medicine contact a poison control center or emergency room at once. NOTE: This medicine is only for you. Do not share this medicine with others. What if I miss a dose? It is important not to miss your dose. Call your doctor or health care professional if you are unable to keep an appointment. What may interact with this medication?  This medicine may interact with the following medications: antiviral medicines for hepatitis, HIV or AIDS certain antibiotics like erythromycin and clarithromycin certain medicines for fungal infections like ketoconazole and itraconazole certain medicines for seizures like carbamazepine, phenobarbital, phenytoin gemfibrozil nefazodone rifampin St. John's wort This list may not describe all possible interactions. Give your health care provider a list of all the medicines, herbs, non-prescription drugs, or dietary supplements you use. Also tell them if you smoke, drink alcohol, or use illegal drugs. Some items may interact with your medicine. What should I watch for while using this medication? Your condition  will be monitored carefully while you are receiving this medicine. You will need important blood work done while you are taking this medicine. This medicine can cause serious allergic reactions. If you experience allergic reactions like skin rash, itching or hives, swelling of the face, lips, or tongue, tell your doctor or health care professional right away. In some cases, you may be given additional medicines to help with side effects. Follow all directions for their use. This drug may make you feel generally unwell. This is not uncommon, as chemotherapy can affect healthy cells as well as cancer cells. Report any side effects. Continue your course of treatment even though you feel ill unless your doctor tells you to stop. Call your doctor or health care professional for advice if you get a fever, chills or sore throat, or other symptoms of a cold or flu. Do not treat yourself. This drug decreases your body's ability to fight infections. Try to avoid being around people who are sick. This medicine may increase your risk to bruise or bleed. Call your doctor or health care professional if you notice any unusual bleeding. Be careful brushing and flossing your teeth or using a toothpick because you may get an infection or bleed more easily. If you have any dental work done, tell your dentist you are receiving this medicine. Avoid taking products that contain aspirin, acetaminophen, ibuprofen, naproxen, or ketoprofen unless instructed by your doctor. These medicines may hide a fever. Do not become pregnant while taking this medicine or for 6 months after stopping it. Women should inform their doctor if they wish to become pregnant or think they might be pregnant. Men should not father a child while taking this medicine or for 3 months after stopping it. There is a potential for serious side effects to an unborn child. Talk to your health care professional or pharmacist for more information. Do not breast-feed  an infant while taking this medicine or for 2 weeks after stopping it. This medicine may interfere with the ability to get pregnant or to father a child. You should talk to your doctor or health care professional if you are concerned about your fertility. What side effects may I notice from receiving this medication? Side effects that you should report to your doctor or health care professional as soon as possible: allergic reactions like skin rash, itching or hives, swelling of the face, lips, or tongue breathing problems changes in vision fast, irregular heartbeat low blood pressure mouth sores pain, tingling, numbness in the hands or feet signs of decreased platelets or bleeding - bruising, pinpoint red spots on the skin, black, tarry stools, blood in the urine signs of decreased red blood cells - unusually weak or tired, feeling faint or lightheaded, falls signs of infection - fever or chills, cough, sore throat, pain or difficulty passing urine signs and symptoms of liver injury like dark yellow or brown urine;  general ill feeling or flu-like symptoms; light-colored stools; loss of appetite; nausea; right upper belly pain; unusually weak or tired; yellowing of the eyes or skin swelling of the ankles, feet, hands unusually slow heartbeat Side effects that usually do not require medical attention (report to your doctor or health care professional if they continue or are bothersome): diarrhea hair loss loss of appetite nausea, vomiting tiredness This list may not describe all possible side effects. Call your doctor for medical advice about side effects. You may report side effects to FDA at 1-800-FDA-1088. Where should I keep my medication? This drug is given in a hospital or clinic and will not be stored at home. NOTE: This sheet is a summary. It may not cover all possible information. If you have questions about this medicine, talk to your doctor, pharmacist, or health care provider.   2022 Elsevier/Gold Standard (2017-01-28 13:03:45)  Hypokalemia Hypokalemia means that the amount of potassium in the blood is lower than normal. Potassium is a chemical (electrolyte) that helps regulate the amount of fluid in the body. It also stimulates muscle tightening (contraction) and helps nerves work properly. Normally, most of the body's potassium is inside cells, and only a very small amount is in the blood. Because the amount in the blood is so small, minor changes to potassium levels in the blood can be life-threatening. What are the causes? This condition may be caused by: Antibiotic medicine. Diarrhea or vomiting. Taking too much of a medicine that helps you have a bowel movement (laxative) can cause diarrhea and lead to hypokalemia. Chronic kidney disease (CKD). Medicines that help the body get rid of excess fluid (diuretics). Eating disorders, such as bulimia. Low magnesium levels in the body. Sweating a lot. What are the signs or symptoms? Symptoms of this condition include: Weakness. Constipation. Fatigue. Muscle cramps. Mental confusion. Skipped heartbeats or irregular heartbeat (palpitations). Tingling or numbness. How is this diagnosed? This condition is diagnosed with a blood test. How is this treated? This condition may be treated by: Taking potassium supplements by mouth. Adjusting the medicines that you take. Eating more foods that contain a lot of potassium. If your potassium level is very low, you may need to get potassium through an IV and be monitored in the hospital. Follow these instructions at home:  Take over-the-counter and prescription medicines only as told by your health care provider. This includes vitamins and supplements. Eat a healthy diet. A healthy diet includes fresh fruits and vegetables, whole grains, healthy fats, and lean proteins. If instructed, eat more foods that contain a lot of potassium. This includes: Nuts, such as peanuts and  pistachios. Seeds, such as sunflower seeds and pumpkin seeds. Peas, lentils, and lima beans. Whole grain and bran cereals and breads. Fresh fruits and vegetables, such as apricots, avocado, bananas, cantaloupe, kiwi, oranges, tomatoes, asparagus, and potatoes. Orange juice. Tomato juice. Red meats. Yogurt. Keep all follow-up visits as told by your health care provider. This is important. Contact a health care provider if you: Have weakness that gets worse. Feel your heart pounding or racing. Vomit. Have diarrhea. Have diabetes (diabetes mellitus) and you have trouble keeping your blood sugar (glucose) in your target range. Get help right away if you: Have chest pain. Have shortness of breath. Have vomiting or diarrhea that lasts for more than 2 days. Faint. Summary Hypokalemia means that the amount of potassium in the blood is lower than normal. This condition is diagnosed with a blood test. Hypokalemia may be treated by  taking potassium supplements, adjusting the medicines that you take, or eating more foods that are high in potassium. If your potassium level is very low, you may need to get potassium through an IV and be monitored in the hospital. This information is not intended to replace advice given to you by your health care provider. Make sure you discuss any questions you have with your health care provider. Document Revised: 01/06/2018 Document Reviewed: 01/07/2018 Elsevier Patient Education  2022 [employer].  Hypomagnesemia Hypomagnesemia is a condition in which the level of magnesium in the blood is low. Magnesium is a mineral that is found in many foods. It is used in many different processes in the body. Hypomagnesemia can affect every organ in the body. In severe cases, it can cause life-threatening problems. What are the causes? This condition may be caused by: Not getting enough magnesium in your diet. Malnutrition. Problems with absorbing magnesium from the  intestines. Dehydration. Alcohol abuse. Vomiting. Severe or chronic diarrhea. Some medicines, including medicines that make you urinate more (diuretics). Certain diseases, such as kidney disease, diabetes, celiac disease, and overactive thyroid. What are the signs or symptoms? Symptoms of this condition include: Loss of appetite. Nausea and vomiting. Involuntary shaking or trembling of a body part (tremor). Muscle weakness. Tingling in the arms and legs. Sudden tightening of muscles (muscle spasms). Confusion. Psychiatric issues, such as depression, irritability, or psychosis. A feeling of fluttering of the heart. Seizures. These symptoms are more severe if magnesium levels drop suddenly. How is this diagnosed? This condition may be diagnosed based on: Your symptoms and medical history. A physical exam. Blood and urine tests. How is this treated? Treatment depends on the cause and the severity of the condition. It may be treated with: A magnesium supplement. This can be taken in pill form. If the condition is severe, magnesium is usually given through an IV. Changes to your diet. You may be directed to eat foods that have a lot of magnesium, such as green leafy vegetables, peas, beans, and nuts. Stopping any intake of alcohol. Follow these instructions at home:   Make sure that your diet includes foods with magnesium. Foods that have a lot of magnesium in them include: Green leafy vegetables, such as spinach and broccoli. Beans and peas. Nuts and seeds, such as almonds and sunflower seeds. Whole grains, such as whole grain bread and fortified cereals. Take magnesium supplements if your health care provider tells you to do that. Take them as directed. Take over-the-counter and prescription medicines only as told by your health care provider. Have your magnesium levels monitored as told by your health care provider. When you are active, drink fluids that contain  electrolytes. Avoid drinking alcohol. Keep all follow-up visits as told by your health care provider. This is important. Contact a health care provider if: You get worse instead of better. Your symptoms return. Get help right away if you: Develop severe muscle weakness. Have trouble breathing. Feel that your heart is racing. Summary Hypomagnesemia is a condition in which the level of magnesium in the blood is low. Hypomagnesemia can affect every organ in the body. Treatment may include eating more foods that contain magnesium, taking magnesium supplements, and not drinking alcohol. Have your magnesium levels monitored as told by your health care provider. This information is not intended to replace advice given to you by your health care provider. Make sure you discuss any questions you have with your health care provider. Document Revised: 08/09/2020 Document Reviewed: 10/28/2019  Elsevier Patient Education  2022 [employer].

## 2021-04-11 NOTE — Progress Notes (Signed)
[facility] OFFICE PROGRESS NOTE   Diagnosis: Pancreas cancer  INTERVAL HISTORY:   [facility] Ross completed a cycle of gemcitabine/Abraxane 03/28/2021.  No rash.  No change in neuropathy symptoms.  She continues to have numbness and tingling in the feet.  She had chills beginning on 03/30/2021 and lasting until 03/31/2021.   Objective:  Vital signs in last 24 hours:  Blood pressure 125/88, pulse 61, temperature 98.1 F (36.7 C), temperature source Oral, resp. rate 18, height 5' 8"  (1.727 m), weight 192 lb (87.1 kg), last menstrual period 09/17/2012, SpO2 100 %.    HEENT: No thrush or ulcers, mild hyperpigmentation of the tongue Resp: Lungs clear bilaterally Cardio: Regular rate and rhythm GI: No hepatosplenomegaly, no mass, nontender Vascular: No leg edema   Portacath/PICC-without erythema  Lab Results:  Lab Results  Component Value Date   WBC 4.0 04/11/2021   HGB 10.1 (L) 04/11/2021   HCT 30.1 (L) 04/11/2021   MCV 90.7 04/11/2021   PLT 350 04/11/2021   NEUTROABS 1.9 04/11/2021    CMP  Lab Results  Component Value Date   NA 140 03/28/2021   K 2.8 (L) 03/28/2021   CL 105 03/28/2021   CO2 27 03/28/2021   GLUCOSE 109 (H) 03/28/2021   BUN 11 03/28/2021   CREATININE 1.07 (H) 03/28/2021   CALCIUM 9.5 03/28/2021   PROT 7.0 03/28/2021   ALBUMIN 4.1 03/28/2021   AST 20 03/28/2021   ALT 27 03/28/2021   ALKPHOS 83 03/28/2021   BILITOT 0.7 03/28/2021   GFRNONAA >60 03/28/2021   GFRAA >60 02/07/2020    Lab Results  Component Value Date   GHW299 60 (H) 03/28/2021     Medications: I have reviewed the patient's current medications.   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 Tumor invades the duodenal wall and vascular groove, resection margins negative, 4/34 lymph nodes positive MSI-stable, tumor showed instability in 2 loci as did adjacent normal tissue EUS FNA biopsy of pancreas mass on  07/03/2018-well-differentiated neuroendocrine tumor CTs 01/29/2019-ill-defined pancreas head mass, 5 pulmonary nodules-1 with a small amount of central cavitation, tumor abuts the left margin of the portal vein indistinct density surrounding, hepatic artery, complex cystic lesion of the right kidney, right adrenal mass-characterized as an adenoma on a [facility] MRI 12/21/2018 [facility] 03/03/2019-no focal pancreas activity, no tracer accumulation within the suspicious pulmonary nodules, left uterine mass with tracer accumulation felt to represent a leiomyoma Elevated preoperative CA 19-9--CA 19-9 186 on 01/18/2019 CT chest 04/16/2019-multiple bilateral pulmonary nodules, some with increased cavitation, stable in size Cycle 1 FOLFIRINOX 04/27/2019 Cycle 2 FOLFIRINOX 05/11/2019 Cycle 3 FOLFIRINOX 05/23/2019 Cycle 4 FOLFIRINOX 06/08/2019 Cycle 5 FOLFIRINOX 06/22/2019 CT chest 07/02/2019-stable size of bilateral pulmonary nodules.  Dominant cavitary lesions in both lungs show increased cavitation with thinner walls.  Stable 2.1 cm right adrenal nodule. 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 No definite evidence of metastatic disease in the abdomen or pelvis. Markedly enlarged and heterogeneous appearing uterus, likely to represent multifocal fibroids. 1 of these lesions appears to be an exophytic subserosal fibroid in the posterior lateral aspect of the uterine body on the left side although this  comes in close proximity to the left adnexa such that a primary ovarian lesion is difficult to completely  exclude. CTs 02/07/2020-slight enlargement of bilateral lung nodules, some are cavitary, no evidence of metastatic disease in the abdomen or pelvis, stable right adrenal nodule, uterine fibroids CTs 04/26/2020-mild enlargement of pulmonary nodules, slight increase in trace pelvic fluid, new soft tissue thickening inferior to the cecal tip suspicious for peritoneal metastasis CT 05/26/2020-improved appearance of soft tissue at the inferior tip of the cecum, mildly thickened short appendix-findings suggestive of resolving appendicitis, stable small bibasilar pulmonary nodules, fibroids Plan biopsy of right cecal tip soft tissue canceled secondary to radiologic improvement CT chest 08/02/2020-enlargement and progressive cavitation of multiple bilateral lung nodules.  Some new nodules are present. CTs 10/24/2020- increase in size of pulmonary nodules, no new nodules, no evidence of metastatic disease in the abdomen, stable right adrenal nodule CT 01/09/2021-slight interval enlargement of pulmonary nodules, stable right adrenal nodule Navigation bronchoscopy 01/30/2021-left lower lobe cavitary nodule FNA-adenocarcinoma, brushing-adenocarcinoma.  Left lower lobe lavage-adenocarcinoma.  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  She will  complete another cycle of gemcitabine/Abraxane today.  She will try Tylenol or Advil if she has chills following the cycle of chemotherapy.  This is likely related to gemcitabine.  Alyssa Ross will return for an office visit and chemotherapy in 2 weeks.  The potassium and magnesium are low today.  She will receive IV supplementation.  She continues oral supplementation at home.  The hypokalemia is likely in part related to HCTZ therapy.  She does not have significant diarrhea.  [name], MD  04/11/2021  9:21 AM

## 2021-04-11 NOTE — Progress Notes (Signed)
Patient presents for treatment. RN assessment completed along with the following:  Labs/vitals reviewed - Yes, and will need potassium and magnesium    Weight within 10% of previous measurement - Yes Oncology Treatment Attestation completed for current therapy- Yes, on date 03/02/21 Informed consent completed and reflects current therapy/intent - Yes, on date 03/28/21             Provider progress note reviewed - Yes, today's provider note was reviewed. Treatment/Antibody/Supportive plan reviewed - Yes, and there are no adjustments needed for today's treatment. S&H and other orders reviewed - Yes, and magnesium and potassium ordered Previous treatment date reviewed - Yes, and the appropriate amount of time has elapsed between treatments. Clinic Hand Off Received from - none  Patient to proceed with treatment.

## 2021-04-11 NOTE — Telephone Encounter (Signed)
Patient seen by Dr. Benay Spice today  Vitals are within treatment parameters.  Labs reviewed by Dr. Benay Spice and are not all within treatment parameters. Pt to receive potassium and magnesium added to treatment. OK to treat Per Dr Benay Spice.  Per physician team, patient is ready for treatment and there are NO modifications to the treatment plan.

## 2021-04-12 ENCOUNTER — Encounter: Payer: Self-pay | Admitting: Oncology

## 2021-04-13 ENCOUNTER — Encounter: Payer: Self-pay | Admitting: *Deleted

## 2021-04-13 NOTE — Progress Notes (Signed)
Prescription for cranial prosthesis faxed to [facility] per pt request.

## 2021-04-17 ENCOUNTER — Other Ambulatory Visit: Payer: Self-pay | Admitting: Oncology

## 2021-04-17 DIAGNOSIS — C250 Malignant neoplasm of head of pancreas: Secondary | ICD-10-CM

## 2021-04-21 ENCOUNTER — Other Ambulatory Visit: Payer: Self-pay | Admitting: Oncology

## 2021-04-25 ENCOUNTER — Inpatient Hospital Stay (HOSPITAL_BASED_OUTPATIENT_CLINIC_OR_DEPARTMENT_OTHER): Payer: BC Managed Care – PPO | Admitting: Nurse Practitioner

## 2021-04-25 ENCOUNTER — Inpatient Hospital Stay: Payer: BC Managed Care – PPO

## 2021-04-25 ENCOUNTER — Encounter: Payer: Self-pay | Admitting: Nurse Practitioner

## 2021-04-25 ENCOUNTER — Other Ambulatory Visit: Payer: Self-pay

## 2021-04-25 VITALS — BP 132/84 | HR 89 | Temp 98.1°F | Resp 18 | Ht 68.0 in | Wt 190.0 lb

## 2021-04-25 DIAGNOSIS — Z923 Personal history of irradiation: Secondary | ICD-10-CM | POA: Diagnosis not present

## 2021-04-25 DIAGNOSIS — N6321 Unspecified lump in the left breast, upper outer quadrant: Secondary | ICD-10-CM | POA: Diagnosis not present

## 2021-04-25 DIAGNOSIS — C250 Malignant neoplasm of head of pancreas: Secondary | ICD-10-CM

## 2021-04-25 DIAGNOSIS — Z23 Encounter for immunization: Secondary | ICD-10-CM | POA: Diagnosis not present

## 2021-04-25 DIAGNOSIS — Z5111 Encounter for antineoplastic chemotherapy: Secondary | ICD-10-CM | POA: Diagnosis not present

## 2021-04-25 DIAGNOSIS — I10 Essential (primary) hypertension: Secondary | ICD-10-CM | POA: Diagnosis not present

## 2021-04-25 DIAGNOSIS — E1140 Type 2 diabetes mellitus with diabetic neuropathy, unspecified: Secondary | ICD-10-CM | POA: Diagnosis not present

## 2021-04-25 DIAGNOSIS — Z79899 Other long term (current) drug therapy: Secondary | ICD-10-CM | POA: Diagnosis not present

## 2021-04-25 DIAGNOSIS — R918 Other nonspecific abnormal finding of lung field: Secondary | ICD-10-CM | POA: Diagnosis not present

## 2021-04-25 LAB — CBC WITH DIFFERENTIAL (CANCER CENTER ONLY)
Abs Immature Granulocytes: 0.01 10*3/uL (ref 0.00–0.07)
Basophils Absolute: 0 10*3/uL (ref 0.0–0.1)
Basophils Relative: 0 %
Eosinophils Absolute: 0.1 10*3/uL (ref 0.0–0.5)
Eosinophils Relative: 2 %
HCT: 28.8 % — ABNORMAL LOW (ref 36.0–46.0)
Hemoglobin: 9.7 g/dL — ABNORMAL LOW (ref 12.0–15.0)
Immature Granulocytes: 0 %
Lymphocytes Relative: 27 %
Lymphs Abs: 1.5 10*3/uL (ref 0.7–4.0)
MCH: 30.5 pg (ref 26.0–34.0)
MCHC: 33.7 g/dL (ref 30.0–36.0)
MCV: 90.6 fL (ref 80.0–100.0)
Monocytes Absolute: 0.5 10*3/uL (ref 0.1–1.0)
Monocytes Relative: 9 %
Neutro Abs: 3.3 10*3/uL (ref 1.7–7.7)
Neutrophils Relative %: 62 %
Platelet Count: 392 10*3/uL (ref 150–400)
RBC: 3.18 MIL/uL — ABNORMAL LOW (ref 3.87–5.11)
RDW: 12 % (ref 11.5–15.5)
WBC Count: 5.4 10*3/uL (ref 4.0–10.5)
nRBC: 0 % (ref 0.0–0.2)

## 2021-04-25 LAB — CMP (CANCER CENTER ONLY)
ALT: 59 U/L — ABNORMAL HIGH (ref 0–44)
AST: 28 U/L (ref 15–41)
Albumin: 3.6 g/dL (ref 3.5–5.0)
Alkaline Phosphatase: 185 U/L — ABNORMAL HIGH (ref 38–126)
Anion gap: 8 (ref 5–15)
BUN: 8 mg/dL (ref 6–20)
CO2: 29 mmol/L (ref 22–32)
Calcium: 9 mg/dL (ref 8.9–10.3)
Chloride: 103 mmol/L (ref 98–111)
Creatinine: 0.9 mg/dL (ref 0.44–1.00)
GFR, Estimated: >60 mL/min (ref >60)
Glucose, Bld: 165 mg/dL — ABNORMAL HIGH (ref 70–99)
Potassium: 3 mmol/L — ABNORMAL LOW (ref 3.5–5.1)
Sodium: 140 mmol/L (ref 135–145)
Total Bilirubin: 0.5 mg/dL (ref 0.3–1.2)
Total Protein: 6.9 g/dL (ref 6.5–8.1)

## 2021-04-25 LAB — MAGNESIUM: Magnesium: 1.6 mg/dL — ABNORMAL LOW (ref 1.7–2.4)

## 2021-04-25 MED ORDER — HEPARIN SOD (PORK) LOCK FLUSH 100 UNIT/ML IV SOLN
500.0000 [IU] | Freq: Once | INTRAVENOUS | Status: AC | PRN
  Administered 2021-04-25: 500 [IU]

## 2021-04-25 MED ORDER — SODIUM CHLORIDE 0.9% FLUSH
10.0000 mL | INTRAVENOUS | Status: DC | PRN
  Administered 2021-04-25: 10 mL

## 2021-04-25 MED ORDER — MAGNESIUM SULFATE 2 GM/50ML IV SOLN
2.0000 g | Freq: Once | INTRAVENOUS | Status: AC
  Administered 2021-04-25: 2 g via INTRAVENOUS
  Filled 2021-04-25: qty 50

## 2021-04-25 MED ORDER — POTASSIUM CHLORIDE 10 MEQ/100ML IV SOLN
10.0000 meq | INTRAVENOUS | Status: AC
  Administered 2021-04-25 (×2): 10 meq via INTRAVENOUS
  Filled 2021-04-25: qty 100

## 2021-04-25 MED ORDER — SODIUM CHLORIDE 0.9 % IV SOLN: Freq: Once | INTRAVENOUS | Status: AC

## 2021-04-25 MED ORDER — PROCHLORPERAZINE MALEATE 10 MG PO TABS
10.0000 mg | ORAL_TABLET | Freq: Once | ORAL | Status: AC
  Administered 2021-04-25: 10 mg via ORAL
  Filled 2021-04-25: qty 1

## 2021-04-25 MED ORDER — PACLITAXEL PROTEIN-BOUND CHEMO INJECTION 100 MG
100.0000 mg/m2 | Freq: Once | INTRAVENOUS | Status: AC
  Administered 2021-04-25: 200 mg via INTRAVENOUS
  Filled 2021-04-25: qty 40

## 2021-04-25 MED ORDER — SODIUM CHLORIDE 0.9 % IV SOLN
1000.0000 mg/m2 | Freq: Once | INTRAVENOUS | Status: AC
  Administered 2021-04-25: 2052 mg via INTRAVENOUS
  Filled 2021-04-25: qty 52.6

## 2021-04-25 NOTE — Patient Instructions (Addendum)
**Maintain Magnesium Oxide at three times daily. INCREASE you oral potassium to four times daily due to low potassium level today.  Alyssa Ross  Discharge Instructions: Thank you for choosing [facility] to provide your oncology and hematology care.   If you have a lab appointment with the [facility], please go directly to the [facility] and check in at the registration area.   Wear comfortable clothing and clothing appropriate for easy access to any Portacath or PICC line.   We strive to give you quality time with your provider. You may need to reschedule your appointment if you arrive late (15 or more minutes).  Arriving late affects you and other patients whose appointments are after yours.  Also, if you miss three or more appointments without notifying the office, you may be dismissed from the clinic at the provider's discretion.      For prescription refill requests, have your pharmacy contact our office and allow 72 hours for refills to be completed.    Today you received the following chemotherapy and/or immunotherapy agents Gemzar, abraxane. Potassium and Magnesium       To help prevent nausea and vomiting after your treatment, we encourage you to take your nausea medication as directed.  BELOW ARE SYMPTOMS THAT SHOULD BE REPORTED IMMEDIATELY: *FEVER GREATER THAN 100.4 F (38 C) OR HIGHER *CHILLS OR SWEATING *NAUSEA AND VOMITING THAT IS NOT CONTROLLED WITH YOUR NAUSEA MEDICATION *UNUSUAL SHORTNESS OF BREATH *UNUSUAL BRUISING OR BLEEDING *URINARY PROBLEMS (pain or burning when urinating, or frequent urination) *BOWEL PROBLEMS (unusual diarrhea, constipation, pain near the anus) TENDERNESS IN MOUTH AND THROAT WITH OR WITHOUT PRESENCE OF ULCERS (sore throat, sores in mouth, or a toothache) UNUSUAL RASH, SWELLING OR PAIN  UNUSUAL VAGINAL DISCHARGE OR ITCHING   Items with * indicate a potential emergency and should be followed up as  soon as possible or go to the Emergency Department if any problems should occur.  Please show the CHEMOTHERAPY ALERT CARD or IMMUNOTHERAPY ALERT CARD at check-in to the Emergency Department and triage nurse.  Should you have questions after your visit or need to cancel or reschedule your appointment, please contact [facility]  Dept: [phone]  and follow the prompts.  Office hours are 8:00 a.m. to 4:30 p.m. Monday - Friday. Please note that voicemails left after 4:00 p.m. may not be returned until the following business day.  We are closed weekends and major holidays. You have access to a nurse at all times for urgent questions. Please call the main number to the clinic Dept: [phone] and follow the prompts.   For any non-urgent questions, you may also contact your provider using MyChart. We now offer e-Visits for anyone 60 and older to request care online for non-urgent symptoms. For details visit [email or web address].   Also download the MyChart app! Go to the app store, search "MyChart", open the app, select [facility], and log in with your MyChart username and password.  Due to Covid, a mask is required upon entering the hospital/clinic. If you do not have a mask, one will be given to you upon arrival. For doctor visits, patients may have 1 support person aged 29 or older with them. For treatment visits, patients cannot have anyone with them due to current Covid guidelines and our immunocompromised population.   Gemcitabine injection What is this medication? GEMCITABINE (jem SYE ta been) is a chemotherapy drug. This medicine is used to treat many  types of cancer like breast cancer, lung cancer, pancreatic cancer, and ovarian cancer. This medicine may be used for other purposes; ask your health care provider or pharmacist if you have questions. COMMON BRAND NAME(S): Gemzar, Infugem What should I tell my care team before I take this medication? They need to  know if you have any of these conditions: blood disorders infection kidney disease liver disease lung or breathing disease, like asthma recent or ongoing radiation therapy an unusual or allergic reaction to gemcitabine, other chemotherapy, other medicines, foods, dyes, or preservatives pregnant or trying to get pregnant breast-feeding How should I use this medication? This drug is given as an infusion into a vein. It is administered in a hospital or clinic by a specially trained health care professional. Talk to your pediatrician regarding the use of this medicine in children. Special care may be needed. Overdosage: If you think you have taken too much of this medicine contact a poison control center or emergency room at once. NOTE: This medicine is only for you. Do not share this medicine with others. What if I miss a dose? It is important not to miss your dose. Call your doctor or health care professional if you are unable to keep an appointment. What may interact with this medication? medicines to increase blood counts like filgrastim, pegfilgrastim, sargramostim some other chemotherapy drugs like cisplatin vaccines Talk to your doctor or health care professional before taking any of these medicines: acetaminophen aspirin ibuprofen ketoprofen naproxen This list may not describe all possible interactions. Give your health care provider a list of all the medicines, herbs, non-prescription drugs, or dietary supplements you use. Also tell them if you smoke, drink alcohol, or use illegal drugs. Some items may interact with your medicine. What should I watch for while using this medication? Visit your doctor for checks on your progress. This drug may make you feel generally unwell. This is not uncommon, as chemotherapy can affect healthy cells as well as cancer cells. Report any side effects. Continue your course of treatment even though you feel ill unless your doctor tells you to  stop. In some cases, you may be given additional medicines to help with side effects. Follow all directions for their use. Call your doctor or health care professional for advice if you get a fever, chills or sore throat, or other symptoms of a cold or flu. Do not treat yourself. This drug decreases your body's ability to fight infections. Try to avoid being around people who are sick. This medicine may increase your risk to bruise or bleed. Call your doctor or health care professional if you notice any unusual bleeding. Be careful brushing and flossing your teeth or using a toothpick because you may get an infection or bleed more easily. If you have any dental work done, tell your dentist you are receiving this medicine. Avoid taking products that contain aspirin, acetaminophen, ibuprofen, naproxen, or ketoprofen unless instructed by your doctor. These medicines may hide a fever. Do not become pregnant while taking this medicine or for 6 months after stopping it. Women should inform their doctor if they wish to become pregnant or think they might be pregnant. Men should not father a child while taking this medicine and for 3 months after stopping it. There is a potential for serious side effects to an unborn child. Talk to your health care professional or pharmacist for more information. Do not breast-feed an infant while taking this medicine or for at least 1 week after  stopping it. Men should inform their doctors if they wish to father a child. This medicine may lower sperm counts. Talk with your doctor or health care professional if you are concerned about your fertility. What side effects may I notice from receiving this medication? Side effects that you should report to your doctor or health care professional as soon as possible: allergic reactions like skin rash, itching or hives, swelling of the face, lips, or tongue breathing problems pain, redness, or irritation at site where injected signs  and symptoms of a dangerous change in heartbeat or heart rhythm like chest pain; dizziness; fast or irregular heartbeat; palpitations; feeling faint or lightheaded, falls; breathing problems signs of decreased platelets or bleeding - bruising, pinpoint red spots on the skin, black, tarry stools, blood in the urine signs of decreased red blood cells - unusually weak or tired, feeling faint or lightheaded, falls signs of infection - fever or chills, cough, sore throat, pain or difficulty passing urine signs and symptoms of kidney injury like trouble passing urine or change in the amount of urine signs and symptoms of liver injury like dark yellow or brown urine; general ill feeling or flu-like symptoms; light-colored stools; loss of appetite; nausea; right upper belly pain; unusually weak or tired; yellowing of the eyes or skin swelling of ankles, feet, hands Side effects that usually do not require medical attention (report to your doctor or health care professional if they continue or are bothersome): constipation diarrhea hair loss loss of appetite nausea rash vomiting This list may not describe all possible side effects. Call your doctor for medical advice about side effects. You may report side effects to FDA at 1-800-FDA-1088. Where should I keep my medication? This drug is given in a hospital or clinic and will not be stored at home. NOTE: This sheet is a summary. It may not cover all possible information. If you have questions about this medicine, talk to your doctor, pharmacist, or health care provider.  2022 Elsevier/Gold Standard (2017-08-20 18:06:11)  Nanoparticle Albumin-Bound Paclitaxel injection What is this medication? NANOPARTICLE ALBUMIN-BOUND PACLITAXEL (Na no PAHR ti kuhl al BYOO muhn-bound PAK li TAX el) is a chemotherapy drug. It targets fast dividing cells, like cancer cells, and causes these cells to die. This medicine is used to treat advanced breast cancer, lung  cancer, and pancreatic cancer. This medicine may be used for other purposes; ask your health care provider or pharmacist if you have questions. COMMON BRAND NAME(S): Abraxane What should I tell my care team before I take this medication? They need to know if you have any of these conditions: kidney disease liver disease low blood counts, like low white cell, platelet, or red cell counts lung or breathing disease, like asthma tingling of the fingers or toes, or other nerve disorder an unusual or allergic reaction to paclitaxel, albumin, other chemotherapy, other medicines, foods, dyes, or preservatives pregnant or trying to get pregnant breast-feeding How should I use this medication? This drug is given as an infusion into a vein. It is administered in a hospital or clinic by a specially trained health care professional. Talk to your pediatrician regarding the use of this medicine in children. Special care may be needed. Overdosage: If you think you have taken too much of this medicine contact a poison control center or emergency room at once. NOTE: This medicine is only for you. Do not share this medicine with others. What if I miss a dose? It is important not to miss your  dose. Call your doctor or health care professional if you are unable to keep an appointment. What may interact with this medication? This medicine may interact with the following medications: antiviral medicines for hepatitis, HIV or AIDS certain antibiotics like erythromycin and clarithromycin certain medicines for fungal infections like ketoconazole and itraconazole certain medicines for seizures like carbamazepine, phenobarbital, phenytoin gemfibrozil nefazodone rifampin St. John's wort This list may not describe all possible interactions. Give your health care provider a list of all the medicines, herbs, non-prescription drugs, or dietary supplements you use. Also tell them if you smoke, drink alcohol, or use  illegal drugs. Some items may interact with your medicine. What should I watch for while using this medication? Your condition will be monitored carefully while you are receiving this medicine. You will need important blood work done while you are taking this medicine. This medicine can cause serious allergic reactions. If you experience allergic reactions like skin rash, itching or hives, swelling of the face, lips, or tongue, tell your doctor or health care professional right away. In some cases, you may be given additional medicines to help with side effects. Follow all directions for their use. This drug may make you feel generally unwell. This is not uncommon, as chemotherapy can affect healthy cells as well as cancer cells. Report any side effects. Continue your course of treatment even though you feel ill unless your doctor tells you to stop. Call your doctor or health care professional for advice if you get a fever, chills or sore throat, or other symptoms of a cold or flu. Do not treat yourself. This drug decreases your body's ability to fight infections. Try to avoid being around people who are sick. This medicine may increase your risk to bruise or bleed. Call your doctor or health care professional if you notice any unusual bleeding. Be careful brushing and flossing your teeth or using a toothpick because you may get an infection or bleed more easily. If you have any dental work done, tell your dentist you are receiving this medicine. Avoid taking products that contain aspirin, acetaminophen, ibuprofen, naproxen, or ketoprofen unless instructed by your doctor. These medicines may hide a fever. Do not become pregnant while taking this medicine or for 6 months after stopping it. Women should inform their doctor if they wish to become pregnant or think they might be pregnant. Men should not father a child while taking this medicine or for 3 months after stopping it. There is a potential for  serious side effects to an unborn child. Talk to your health care professional or pharmacist for more information. Do not breast-feed an infant while taking this medicine or for 2 weeks after stopping it. This medicine may interfere with the ability to get pregnant or to father a child. You should talk to your doctor or health care professional if you are concerned about your fertility. What side effects may I notice from receiving this medication? Side effects that you should report to your doctor or health care professional as soon as possible: allergic reactions like skin rash, itching or hives, swelling of the face, lips, or tongue breathing problems changes in vision fast, irregular heartbeat low blood pressure mouth sores pain, tingling, numbness in the hands or feet signs of decreased platelets or bleeding - bruising, pinpoint red spots on the skin, black, tarry stools, blood in the urine signs of decreased red blood cells - unusually weak or tired, feeling faint or lightheaded, falls signs of infection - fever  or chills, cough, sore throat, pain or difficulty passing urine signs and symptoms of liver injury like dark yellow or brown urine; general ill feeling or flu-like symptoms; light-colored stools; loss of appetite; nausea; right upper belly pain; unusually weak or tired; yellowing of the eyes or skin swelling of the ankles, feet, hands unusually slow heartbeat Side effects that usually do not require medical attention (report to your doctor or health care professional if they continue or are bothersome): diarrhea hair loss loss of appetite nausea, vomiting tiredness This list may not describe all possible side effects. Call your doctor for medical advice about side effects. You may report side effects to FDA at 1-800-FDA-1088. Where should I keep my medication? This drug is given in a hospital or clinic and will not be stored at home. NOTE: This sheet is a summary. It may not  cover all possible information. If you have questions about this medicine, talk to your doctor, pharmacist, or health care provider.  2022 Elsevier/Gold Standard (2017-01-28 13:03:45)  Hypokalemia Hypokalemia means that the amount of potassium in the blood is lower than normal. Potassium is a chemical (electrolyte) that helps regulate the amount of fluid in the body. It also stimulates muscle tightening (contraction) and helps nerves work properly. Normally, most of the body's potassium is inside cells, and only a very small amount is in the blood. Because the amount in the blood is so small, minor changes to potassium levels in the blood can be life-threatening. What are the causes? This condition may be caused by: Antibiotic medicine. Diarrhea or vomiting. Taking too much of a medicine that helps you have a bowel movement (laxative) can cause diarrhea and lead to hypokalemia. Chronic kidney disease (CKD). Medicines that help the body get rid of excess fluid (diuretics). Eating disorders, such as bulimia. Low magnesium levels in the body. Sweating a lot. What are the signs or symptoms? Symptoms of this condition include: Weakness. Constipation. Fatigue. Muscle cramps. Mental confusion. Skipped heartbeats or irregular heartbeat (palpitations). Tingling or numbness. How is this diagnosed? This condition is diagnosed with a blood test. How is this treated? This condition may be treated by: Taking potassium supplements by mouth. Adjusting the medicines that you take. Eating more foods that contain a lot of potassium. If your potassium level is very low, you may need to get potassium through an IV and be monitored in the hospital. Follow these instructions at home:  Take over-the-counter and prescription medicines only as told by your health care provider. This includes vitamins and supplements. Eat a healthy diet. A healthy diet includes fresh fruits and vegetables, whole grains,  healthy fats, and lean proteins. If instructed, eat more foods that contain a lot of potassium. This includes: Nuts, such as peanuts and pistachios. Seeds, such as sunflower seeds and pumpkin seeds. Peas, lentils, and lima beans. Whole grain and bran cereals and breads. Fresh fruits and vegetables, such as apricots, avocado, bananas, cantaloupe, kiwi, oranges, tomatoes, asparagus, and potatoes. Orange juice. Tomato juice. Red meats. Yogurt. Keep all follow-up visits as told by your health care provider. This is important. Contact a health care provider if you: Have weakness that gets worse. Feel your heart pounding or racing. Vomit. Have diarrhea. Have diabetes (diabetes mellitus) and you have trouble keeping your blood sugar (glucose) in your target range. Get help right away if you: Have chest pain. Have shortness of breath. Have vomiting or diarrhea that lasts for more than 2 days. Faint. Summary Hypokalemia means that the amount  of potassium in the blood is lower than normal. This condition is diagnosed with a blood test. Hypokalemia may be treated by taking potassium supplements, adjusting the medicines that you take, or eating more foods that are high in potassium. If your potassium level is very low, you may need to get potassium through an IV and be monitored in the hospital. This information is not intended to replace advice given to you by your health care provider. Make sure you discuss any questions you have with your health care provider. Document Revised: 01/06/2018 Document Reviewed: 01/07/2018 Elsevier Patient Education  2022 [place].  Hypomagnesemia Hypomagnesemia is a condition in which the level of magnesium in the blood is low. Magnesium is a mineral that is found in many foods. It is used in many different processes in the body. Hypomagnesemia can affect every organ in the body. In severe cases, it can cause life-threatening problems. What are the  causes? This condition may be caused by: Not getting enough magnesium in your diet. Malnutrition. Problems with absorbing magnesium from the intestines. Dehydration. Alcohol abuse. Vomiting. Severe or chronic diarrhea. Some medicines, including medicines that make you urinate more (diuretics). Certain diseases, such as kidney disease, diabetes, celiac disease, and overactive thyroid. What are the signs or symptoms? Symptoms of this condition include: Loss of appetite. Nausea and vomiting. Involuntary shaking or trembling of a body part (tremor). Muscle weakness. Tingling in the arms and legs. Sudden tightening of muscles (muscle spasms). Confusion. Psychiatric issues, such as depression, irritability, or psychosis. A feeling of fluttering of the heart. Seizures. These symptoms are more severe if magnesium levels drop suddenly. How is this diagnosed? This condition may be diagnosed based on: Your symptoms and medical history. A physical exam. Blood and urine tests. How is this treated? Treatment depends on the cause and the severity of the condition. It may be treated with: A magnesium supplement. This can be taken in pill form. If the condition is severe, magnesium is usually given through an IV. Changes to your diet. You may be directed to eat foods that have a lot of magnesium, such as green leafy vegetables, peas, beans, and nuts. Stopping any intake of alcohol. Follow these instructions at home:   Make sure that your diet includes foods with magnesium. Foods that have a lot of magnesium in them include: Green leafy vegetables, such as spinach and broccoli. Beans and peas. Nuts and seeds, such as almonds and sunflower seeds. Whole grains, such as whole grain bread and fortified cereals. Take magnesium supplements if your health care provider tells you to do that. Take them as directed. Take over-the-counter and prescription medicines only as told by your health care  provider. Have your magnesium levels monitored as told by your health care provider. When you are active, drink fluids that contain electrolytes. Avoid drinking alcohol. Keep all follow-up visits as told by your health care provider. This is important. Contact a health care provider if: You get worse instead of better. Your symptoms return. Get help right away if you: Develop severe muscle weakness. Have trouble breathing. Feel that your heart is racing. Summary Hypomagnesemia is a condition in which the level of magnesium in the blood is low. Hypomagnesemia can affect every organ in the body. Treatment may include eating more foods that contain magnesium, taking magnesium supplements, and not drinking alcohol. Have your magnesium levels monitored as told by your health care provider. This information is not intended to replace advice given to you by your  health care provider. Make sure you discuss any questions you have with your health care provider. Document Revised: 08/09/2020 Document Reviewed: 10/28/2019 Elsevier Patient Education  [place].

## 2021-04-25 NOTE — Patient Instructions (Signed)

## 2021-04-25 NOTE — Progress Notes (Signed)
[facility] OFFICE PROGRESS NOTE   Diagnosis: Pancreas cancer  INTERVAL HISTORY:   Alyssa Ross returns as scheduled.  She completed cycle 2 gemcitabine/Abraxane 04/11/2021.  She denies significant nausea.  No mouth sores.  1 loose stool a day for several days.  No fever.  Chills less noticeable.  Some "body aches" lasting less than 1 day.  She notes darkening of the skin on her hands and several spots on her tongue.  Also a dark area near the Port-A-Cath.  No cough or shortness of breath.  Neuropathy symptoms unchanged.  Objective:  Vital signs in last 24 hours:  Blood pressure 132/84, pulse 89, temperature 98.1 F (36.7 C), temperature source Oral, resp. rate 18, height _0  (1.727 m), weight 190 lb (86.2 kg), last menstrual period 09/17/2012, SpO2 100 %.    HEENT: No thrush or ulcers.  A few areas of hyperpigmentation on the tongue. Resp: Lungs clear bilaterally. Cardio: Regular rate and rhythm. GI: Abdomen soft and nontender.  No hepatomegaly. Vascular: No leg edema.  Calves soft and nontender. Skin: Fingers with hyperpigmentation.  Palms without erythema. Port-A-Cath without erythema.  Small area of skin hyperpigmentation to the right of the Port-A-Cath site.   Lab Results:  Lab Results  Component Value Date   WBC 5.4 04/25/2021   HGB 9.7 (L) 04/25/2021   HCT 28.8 (L) 04/25/2021   MCV 90.6 04/25/2021   PLT 392 04/25/2021   NEUTROABS 3.3 04/25/2021    Imaging:  No results found.  Medications: I have reviewed the patient's current medications.  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 Tumor invades the duodenal wall and vascular groove, resection margins negative, 4/34 lymph nodes positive MSI-stable, tumor showed instability in 2 loci as did adjacent normal tissue EUS FNA biopsy of pancreas mass on 07/03/2018-well-differentiated neuroendocrine tumor CTs 01/29/2019-ill-defined pancreas head  mass, 5 pulmonary nodules-1 with a small amount of central cavitation, tumor abuts the left margin of the portal vein indistinct density surrounding, hepatic artery, complex cystic lesion of the right kidney, right adrenal mass-characterized as an adenoma on a [facility] MRI 12/21/2018 [facility] 03/03/2019-no focal pancreas activity, no tracer accumulation within the suspicious pulmonary nodules, left uterine mass with tracer accumulation felt to represent a leiomyoma Elevated preoperative CA 19-9--CA 19-9 186 on 01/18/2019 CT chest 04/16/2019-multiple bilateral pulmonary nodules, some with increased cavitation, stable in size Cycle 1 FOLFIRINOX 04/27/2019 Cycle 2 FOLFIRINOX 05/11/2019 Cycle 3 FOLFIRINOX 05/23/2019 Cycle 4 FOLFIRINOX 06/08/2019 Cycle 5 FOLFIRINOX 06/22/2019 CT chest 07/02/2019-stable size of bilateral pulmonary nodules.  Dominant cavitary lesions in both lungs show increased cavitation with thinner walls.  Stable 2.1 cm right adrenal nodule. 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 No definite evidence of metastatic disease in the abdomen or pelvis. Markedly enlarged and heterogeneous appearing uterus, likely to represent multifocal fibroids. 1 of these lesions appears to be an exophytic subserosal fibroid in the posterior lateral aspect of the uterine body on the left side although this comes in close proximity to the left adnexa such that a primary ovarian lesion is difficult to completely exclude. CTs 02/07/2020-slight enlargement of bilateral lung nodules, some are cavitary, no  evidence  of metastatic disease in the abdomen or pelvis, stable right adrenal nodule, uterine fibroids CTs 04/26/2020-mild enlargement of pulmonary nodules, slight increase in trace pelvic fluid, new soft tissue thickening inferior to the cecal tip suspicious for peritoneal metastasis CT 05/26/2020-improved appearance of soft tissue at the inferior tip of the cecum, mildly thickened short appendix-findings suggestive of resolving appendicitis, stable small bibasilar pulmonary nodules, fibroids Plan biopsy of right cecal tip soft tissue canceled secondary to radiologic improvement CT chest 08/02/2020-enlargement and progressive cavitation of multiple bilateral lung nodules.  Some new nodules are present. CTs 10/24/2020- increase in size of pulmonary nodules, no new nodules, no evidence of metastatic disease in the abdomen, stable right adrenal nodule CT 01/09/2021-slight interval enlargement of pulmonary nodules, stable right adrenal nodule Navigation bronchoscopy 01/30/2021-left lower lobe cavitary nodule FNA-adenocarcinoma, brushing-adenocarcinoma.  Left lower lobe lavage-adenocarcinoma.  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  She has completed 2 cycles of gemcitabine/Abraxane.  Overall she  is tolerating well.  Plan to proceed with cycle 3 today as scheduled.  CBC reviewed.  Counts adequate to proceed with treatment.  Chemistry panel reviewed.  She has persistent hypokalemia and hypomagnesia.  She will increase K-Dur to 4 times daily, continue magnesium 3 times daily.  IV replacement of both today.  She will return for lab, follow-up, Gemcitabine/Abraxane in 2 weeks.  She will contact the office in the interim with any problems.    Ned Card ANP/GNP-BC   04/25/2021  9:03 AM

## 2021-04-25 NOTE — Progress Notes (Signed)
Patient presents for treatment. RN assessment completed along with the following:  Labs/vitals reviewed - Yes, and within treatment parameters.   Weight within 10% of previous measurement - Yes Oncology Treatment Attestation completed for current therapy- Yes, on date 03/02/21 Informed consent completed and reflects current therapy/intent - Yes, on date 03/28/21             Provider progress note reviewed - Today's provider note is not yet available. I reviewed the most recent oncology provider progress note in chart dated 04/11/21. Treatment/Antibody/Supportive plan reviewed - Yes, and there are no adjustments needed for today's treatment. S&H and other orders reviewed - Yes, and patient to received K and Mg supplementation today.  Previous treatment date reviewed - Yes, and the appropriate amount of time has elapsed between treatments. Clinic Hand Off Received from - Ned Card, NP  Patient to proceed with treatment.

## 2021-04-25 NOTE — Progress Notes (Signed)
Patient seen by Ned Card NP today  Vitals are within treatment parameters.  Labs reviewed by Ned Card NP and are within treatment parameters.  Per physician team, patient is ready for treatment. Please note that modifications are being made to the treatment plan including : Magnesium and Potassium runs.  Increase oral K+ to QID at home--patient notified

## 2021-04-26 LAB — CANCER ANTIGEN 19-9: CA 19-9: 67 U/mL — ABNORMAL HIGH (ref 0–35)

## 2021-04-27 ENCOUNTER — Other Ambulatory Visit: Payer: Self-pay

## 2021-04-27 ENCOUNTER — Telehealth: Payer: Self-pay

## 2021-04-27 ENCOUNTER — Encounter: Payer: Self-pay | Admitting: Nurse Practitioner

## 2021-04-27 ENCOUNTER — Other Ambulatory Visit: Payer: Self-pay | Admitting: Oncology

## 2021-04-27 MED ORDER — MAGNESIUM OXIDE -MG SUPPLEMENT 400 (240 MG) MG PO TABS: 400.0000 mg | ORAL_TABLET | Freq: Three times a day (TID) | ORAL | 0 refills | Status: DC

## 2021-04-27 MED ORDER — MAGIC MOUTHWASH: 5.0000 mL | Freq: Four times a day (QID) | ORAL | 0 refills | Status: DC | PRN

## 2021-04-27 NOTE — Telephone Encounter (Signed)
TC to Pt returning message via mychart. Pt states she has a mouth sore. Asked Pt if she had any white spots on her tongue. Pt stated she doesn't have any white spots on her tongue but she can feel a sore coming underneath her tongue. Prescription for magic mouthwash phoned in to pharmacy.

## 2021-05-06 ENCOUNTER — Other Ambulatory Visit: Payer: Self-pay | Admitting: Oncology

## 2021-05-07 ENCOUNTER — Encounter: Payer: Self-pay | Admitting: Pulmonary Disease

## 2021-05-07 NOTE — Telephone Encounter (Signed)
Please advise   Good morning, Dr. Valeta Harms, I hope you had a nice, relaxing holiday.  I am in receipt of the [place] from service date 01/30/21 where you performed the lung biopsy.  As expected, my insurance has denied a charge of $1639.00 (code 35) for a treatment room, stating this procedure is considered investigational or experimental.  I was not expecting to be billed for any of the charges not covered by insurance, as per your  promise going into this procedure.  I assume this charge was accidentally missed.  Would you please have this charged removed from the bill as my responsibility?  I am very appreciative of you helping me out on this. Thanks, [employer]

## 2021-05-09 ENCOUNTER — Inpatient Hospital Stay: Payer: BC Managed Care – PPO

## 2021-05-09 ENCOUNTER — Telehealth: Payer: Self-pay

## 2021-05-09 ENCOUNTER — Other Ambulatory Visit: Payer: Self-pay

## 2021-05-09 ENCOUNTER — Inpatient Hospital Stay (HOSPITAL_BASED_OUTPATIENT_CLINIC_OR_DEPARTMENT_OTHER): Payer: BC Managed Care – PPO | Admitting: Oncology

## 2021-05-09 ENCOUNTER — Ambulatory Visit: Payer: BC Managed Care – PPO | Admitting: Nutrition

## 2021-05-09 VITALS — BP 120/83 | HR 100 | Temp 97.8°F | Resp 18 | Ht 68.0 in | Wt 188.6 lb

## 2021-05-09 DIAGNOSIS — I10 Essential (primary) hypertension: Secondary | ICD-10-CM | POA: Diagnosis not present

## 2021-05-09 DIAGNOSIS — C250 Malignant neoplasm of head of pancreas: Secondary | ICD-10-CM

## 2021-05-09 DIAGNOSIS — E1140 Type 2 diabetes mellitus with diabetic neuropathy, unspecified: Secondary | ICD-10-CM | POA: Diagnosis not present

## 2021-05-09 DIAGNOSIS — Z923 Personal history of irradiation: Secondary | ICD-10-CM | POA: Diagnosis not present

## 2021-05-09 DIAGNOSIS — Z23 Encounter for immunization: Secondary | ICD-10-CM | POA: Diagnosis not present

## 2021-05-09 DIAGNOSIS — N6321 Unspecified lump in the left breast, upper outer quadrant: Secondary | ICD-10-CM | POA: Diagnosis not present

## 2021-05-09 DIAGNOSIS — R918 Other nonspecific abnormal finding of lung field: Secondary | ICD-10-CM | POA: Diagnosis not present

## 2021-05-09 DIAGNOSIS — Z5111 Encounter for antineoplastic chemotherapy: Secondary | ICD-10-CM | POA: Diagnosis not present

## 2021-05-09 DIAGNOSIS — Z79899 Other long term (current) drug therapy: Secondary | ICD-10-CM | POA: Diagnosis not present

## 2021-05-09 LAB — CMP (CANCER CENTER ONLY)
ALT: 97 U/L — ABNORMAL HIGH (ref 0–44)
AST: 112 U/L — ABNORMAL HIGH (ref 15–41)
Albumin: 3.4 g/dL — ABNORMAL LOW (ref 3.5–5.0)
Alkaline Phosphatase: 230 U/L — ABNORMAL HIGH (ref 38–126)
Anion gap: 6 (ref 5–15)
BUN: 10 mg/dL (ref 6–20)
CO2: 27 mmol/L (ref 22–32)
Calcium: 9.2 mg/dL (ref 8.9–10.3)
Chloride: 104 mmol/L (ref 98–111)
Creatinine: 1.25 mg/dL — ABNORMAL HIGH (ref 0.44–1.00)
GFR, Estimated: 51 mL/min — ABNORMAL LOW (ref >60)
Glucose, Bld: 276 mg/dL — ABNORMAL HIGH (ref 70–99)
Potassium: 3.8 mmol/L (ref 3.5–5.1)
Sodium: 137 mmol/L (ref 135–145)
Total Bilirubin: 0.4 mg/dL (ref 0.3–1.2)
Total Protein: 6.2 g/dL — ABNORMAL LOW (ref 6.5–8.1)

## 2021-05-09 LAB — CBC WITH DIFFERENTIAL (CANCER CENTER ONLY)
Abs Immature Granulocytes: 0.01 10*3/uL (ref 0.00–0.07)
Basophils Absolute: 0 10*3/uL (ref 0.0–0.1)
Basophils Relative: 1 %
Eosinophils Absolute: 0.2 10*3/uL (ref 0.0–0.5)
Eosinophils Relative: 3 %
HCT: 27.1 % — ABNORMAL LOW (ref 36.0–46.0)
Hemoglobin: 8.6 g/dL — ABNORMAL LOW (ref 12.0–15.0)
Immature Granulocytes: 0 %
Lymphocytes Relative: 28 %
Lymphs Abs: 1.6 10*3/uL (ref 0.7–4.0)
MCH: 29.7 pg (ref 26.0–34.0)
MCHC: 31.7 g/dL (ref 30.0–36.0)
MCV: 93.4 fL (ref 80.0–100.0)
Monocytes Absolute: 0.6 10*3/uL (ref 0.1–1.0)
Monocytes Relative: 11 %
Neutro Abs: 3.2 10*3/uL (ref 1.7–7.7)
Neutrophils Relative %: 57 %
Platelet Count: 572 10*3/uL — ABNORMAL HIGH (ref 150–400)
RBC: 2.9 MIL/uL — ABNORMAL LOW (ref 3.87–5.11)
RDW: 12.8 % (ref 11.5–15.5)
WBC Count: 5.6 10*3/uL (ref 4.0–10.5)
nRBC: 0 % (ref 0.0–0.2)

## 2021-05-09 LAB — MAGNESIUM: Magnesium: 1.5 mg/dL — ABNORMAL LOW (ref 1.7–2.4)

## 2021-05-09 MED ORDER — PACLITAXEL PROTEIN-BOUND CHEMO INJECTION 100 MG
100.0000 mg/m2 | Freq: Once | INTRAVENOUS | Status: AC
  Administered 2021-05-09: 200 mg via INTRAVENOUS
  Filled 2021-05-09: qty 40

## 2021-05-09 MED ORDER — SODIUM CHLORIDE 0.9% FLUSH
10.0000 mL | INTRAVENOUS | Status: DC | PRN
  Administered 2021-05-09: 10 mL

## 2021-05-09 MED ORDER — PROCHLORPERAZINE MALEATE 10 MG PO TABS
10.0000 mg | ORAL_TABLET | Freq: Once | ORAL | Status: AC
  Administered 2021-05-09: 10 mg via ORAL
  Filled 2021-05-09: qty 1

## 2021-05-09 MED ORDER — SODIUM CHLORIDE 0.9 % IV SOLN: Freq: Once | INTRAVENOUS | Status: AC

## 2021-05-09 MED ORDER — INFLUENZA VAC SPLIT QUAD 0.5 ML IM SUSY
0.5000 mL | PREFILLED_SYRINGE | Freq: Once | INTRAMUSCULAR | Status: AC
  Administered 2021-05-09: 0.5 mL via INTRAMUSCULAR
  Filled 2021-05-09: qty 0.5

## 2021-05-09 MED ORDER — SODIUM CHLORIDE 0.9 % IV SOLN
1000.0000 mg/m2 | Freq: Once | INTRAVENOUS | Status: AC
  Administered 2021-05-09: 2052 mg via INTRAVENOUS
  Filled 2021-05-09: qty 52.6

## 2021-05-09 MED ORDER — HEPARIN SOD (PORK) LOCK FLUSH 100 UNIT/ML IV SOLN
500.0000 [IU] | Freq: Once | INTRAVENOUS | Status: AC | PRN
  Administered 2021-05-09: 500 [IU]

## 2021-05-09 NOTE — Patient Instructions (Signed)
**Maintain Magnesium Oxide at three times daily. INCREASE you oral potassium to four times daily due to low potassium level today.    Discharge Instructions: Thank you for choosing [facility] to provide your oncology and hematology care.   If you have a lab appointment with the [facility], please go directly to the [facility] and check in at the registration area.   Wear comfortable clothing and clothing appropriate for easy access to any Portacath or PICC line.   We strive to give you quality time with your provider. You may need to reschedule your appointment if you arrive late (15 or more minutes).  Arriving late affects you and other patients whose appointments are after yours.  Also, if you miss three or more appointments without notifying the office, you may be dismissed from the clinic at the provider's discretion.      For prescription refill requests, have your pharmacy contact our office and allow 72 hours for refills to be completed.    Today you received the following chemotherapy and/or immunotherapy agents Gemzar, abraxane. Potassium and Magnesium       To help prevent nausea and vomiting after your treatment, we encourage you to take your nausea medication as directed.  BELOW ARE SYMPTOMS THAT SHOULD BE REPORTED IMMEDIATELY: *FEVER GREATER THAN 100.4 F (38 C) OR HIGHER *CHILLS OR SWEATING *NAUSEA AND VOMITING THAT IS NOT CONTROLLED WITH YOUR NAUSEA MEDICATION *UNUSUAL SHORTNESS OF BREATH *UNUSUAL BRUISING OR BLEEDING *URINARY PROBLEMS (pain or burning when urinating, or frequent urination) *BOWEL PROBLEMS (unusual diarrhea, constipation, pain near the anus) TENDERNESS IN MOUTH AND THROAT WITH OR WITHOUT PRESENCE OF ULCERS (sore throat, sores in mouth, or a toothache) UNUSUAL RASH, SWELLING OR PAIN  UNUSUAL VAGINAL DISCHARGE OR ITCHING   Items with * indicate a potential emergency and should be followed up as  soon as possible or go to the Emergency Department if any problems should occur.  Please show the CHEMOTHERAPY ALERT CARD or IMMUNOTHERAPY ALERT CARD at check-in to the Emergency Department and triage nurse.  Should you have questions after your visit or need to cancel or reschedule your appointment, please contact [facility]  Dept: [phone]  and follow the prompts.  Office hours are 8:00 a.m. to 4:30 p.m. Monday - Friday. Please note that voicemails left after 4:00 p.m. may not be returned until the following business day.  We are closed weekends and major holidays. You have access to a nurse at all times for urgent questions. Please call the main number to the clinic Dept: [phone] and follow the prompts.   For any non-urgent questions, you may also contact your provider using MyChart. We now offer e-Visits for anyone 21 and older to request care online for non-urgent symptoms. For details visit [email or web address].   Also download the MyChart app! Go to the app store, search "MyChart", open the app, select [facility], and log in with your MyChart username and password.  Due to Covid, a mask is required upon entering the hospital/clinic. If you do not have a mask, one will be given to you upon arrival. For doctor visits, patients may have 1 support person aged 26 or older with them. For treatment visits, patients cannot have anyone with them due to current Covid guidelines and our immunocompromised population.   Gemcitabine injection What is this medication? GEMCITABINE (jem SYE ta been) is a chemotherapy drug. This medicine is used to treat many  types of cancer like breast cancer, lung cancer, pancreatic cancer, and ovarian cancer. This medicine may be used for other purposes; ask your health care provider or pharmacist if you have questions. COMMON BRAND NAME(S): Gemzar, Infugem What should I tell my care team before I take this medication? They need to  know if you have any of these conditions: blood disorders infection kidney disease liver disease lung or breathing disease, like asthma recent or ongoing radiation therapy an unusual or allergic reaction to gemcitabine, other chemotherapy, other medicines, foods, dyes, or preservatives pregnant or trying to get pregnant breast-feeding How should I use this medication? This drug is given as an infusion into a vein. It is administered in a hospital or clinic by a specially trained health care professional. Talk to your pediatrician regarding the use of this medicine in children. Special care may be needed. Overdosage: If you think you have taken too much of this medicine contact a poison control center or emergency room at once. NOTE: This medicine is only for you. Do not share this medicine with others. What if I miss a dose? It is important not to miss your dose. Call your doctor or health care professional if you are unable to keep an appointment. What may interact with this medication? medicines to increase blood counts like filgrastim, pegfilgrastim, sargramostim some other chemotherapy drugs like cisplatin vaccines Talk to your doctor or health care professional before taking any of these medicines: acetaminophen aspirin ibuprofen ketoprofen naproxen This list may not describe all possible interactions. Give your health care provider a list of all the medicines, herbs, non-prescription drugs, or dietary supplements you use. Also tell them if you smoke, drink alcohol, or use illegal drugs. Some items may interact with your medicine. What should I watch for while using this medication? Visit your doctor for checks on your progress. This drug may make you feel generally unwell. This is not uncommon, as chemotherapy can affect healthy cells as well as cancer cells. Report any side effects. Continue your course of treatment even though you feel ill unless your doctor tells you to  stop. In some cases, you may be given additional medicines to help with side effects. Follow all directions for their use. Call your doctor or health care professional for advice if you get a fever, chills or sore throat, or other symptoms of a cold or flu. Do not treat yourself. This drug decreases your body's ability to fight infections. Try to avoid being around people who are sick. This medicine may increase your risk to bruise or bleed. Call your doctor or health care professional if you notice any unusual bleeding. Be careful brushing and flossing your teeth or using a toothpick because you may get an infection or bleed more easily. If you have any dental work done, tell your dentist you are receiving this medicine. Avoid taking products that contain aspirin, acetaminophen, ibuprofen, naproxen, or ketoprofen unless instructed by your doctor. These medicines may hide a fever. Do not become pregnant while taking this medicine or for 6 months after stopping it. Women should inform their doctor if they wish to become pregnant or think they might be pregnant. Men should not father a child while taking this medicine and for 3 months after stopping it. There is a potential for serious side effects to an unborn child. Talk to your health care professional or pharmacist for more information. Do not breast-feed an infant while taking this medicine or for at least 1 week after  stopping it. Men should inform their doctors if they wish to father a child. This medicine may lower sperm counts. Talk with your doctor or health care professional if you are concerned about your fertility. What side effects may I notice from receiving this medication? Side effects that you should report to your doctor or health care professional as soon as possible: allergic reactions like skin rash, itching or hives, swelling of the face, lips, or tongue breathing problems pain, redness, or irritation at site where injected signs  and symptoms of a dangerous change in heartbeat or heart rhythm like chest pain; dizziness; fast or irregular heartbeat; palpitations; feeling faint or lightheaded, falls; breathing problems signs of decreased platelets or bleeding - bruising, pinpoint red spots on the skin, black, tarry stools, blood in the urine signs of decreased red blood cells - unusually weak or tired, feeling faint or lightheaded, falls signs of infection - fever or chills, cough, sore throat, pain or difficulty passing urine signs and symptoms of kidney injury like trouble passing urine or change in the amount of urine signs and symptoms of liver injury like dark yellow or brown urine; general ill feeling or flu-like symptoms; light-colored stools; loss of appetite; nausea; right upper belly pain; unusually weak or tired; yellowing of the eyes or skin swelling of ankles, feet, hands Side effects that usually do not require medical attention (report to your doctor or health care professional if they continue or are bothersome): constipation diarrhea hair loss loss of appetite nausea rash vomiting This list may not describe all possible side effects. Call your doctor for medical advice about side effects. You may report side effects to FDA at 1-800-FDA-1088. Where should I keep my medication? This drug is given in a hospital or clinic and will not be stored at home. NOTE: This sheet is a summary. It may not cover all possible information. If you have questions about this medicine, talk to your doctor, pharmacist, or health care provider.  2022 Elsevier/Gold Standard (2017-08-20 18:06:11)  Nanoparticle Albumin-Bound Paclitaxel injection What is this medication? NANOPARTICLE ALBUMIN-BOUND PACLITAXEL (Na no PAHR ti kuhl al BYOO muhn-bound PAK li TAX el) is a chemotherapy drug. It targets fast dividing cells, like cancer cells, and causes these cells to die. This medicine is used to treat advanced breast cancer, lung  cancer, and pancreatic cancer. This medicine may be used for other purposes; ask your health care provider or pharmacist if you have questions. COMMON BRAND NAME(S): Abraxane What should I tell my care team before I take this medication? They need to know if you have any of these conditions: kidney disease liver disease low blood counts, like low white cell, platelet, or red cell counts lung or breathing disease, like asthma tingling of the fingers or toes, or other nerve disorder an unusual or allergic reaction to paclitaxel, albumin, other chemotherapy, other medicines, foods, dyes, or preservatives pregnant or trying to get pregnant breast-feeding How should I use this medication? This drug is given as an infusion into a vein. It is administered in a hospital or clinic by a specially trained health care professional. Talk to your pediatrician regarding the use of this medicine in children. Special care may be needed. Overdosage: If you think you have taken too much of this medicine contact a poison control center or emergency room at once. NOTE: This medicine is only for you. Do not share this medicine with others. What if I miss a dose? It is important not to miss your  dose. Call your doctor or health care professional if you are unable to keep an appointment. What may interact with this medication? This medicine may interact with the following medications: antiviral medicines for hepatitis, HIV or AIDS certain antibiotics like erythromycin and clarithromycin certain medicines for fungal infections like ketoconazole and itraconazole certain medicines for seizures like carbamazepine, phenobarbital, phenytoin gemfibrozil nefazodone rifampin St. John's wort This list may not describe all possible interactions. Give your health care provider a list of all the medicines, herbs, non-prescription drugs, or dietary supplements you use. Also tell them if you smoke, drink alcohol, or use  illegal drugs. Some items may interact with your medicine. What should I watch for while using this medication? Your condition will be monitored carefully while you are receiving this medicine. You will need important blood work done while you are taking this medicine. This medicine can cause serious allergic reactions. If you experience allergic reactions like skin rash, itching or hives, swelling of the face, lips, or tongue, tell your doctor or health care professional right away. In some cases, you may be given additional medicines to help with side effects. Follow all directions for their use. This drug may make you feel generally unwell. This is not uncommon, as chemotherapy can affect healthy cells as well as cancer cells. Report any side effects. Continue your course of treatment even though you feel ill unless your doctor tells you to stop. Call your doctor or health care professional for advice if you get a fever, chills or sore throat, or other symptoms of a cold or flu. Do not treat yourself. This drug decreases your body's ability to fight infections. Try to avoid being around people who are sick. This medicine may increase your risk to bruise or bleed. Call your doctor or health care professional if you notice any unusual bleeding. Be careful brushing and flossing your teeth or using a toothpick because you may get an infection or bleed more easily. If you have any dental work done, tell your dentist you are receiving this medicine. Avoid taking products that contain aspirin, acetaminophen, ibuprofen, naproxen, or ketoprofen unless instructed by your doctor. These medicines may hide a fever. Do not become pregnant while taking this medicine or for 6 months after stopping it. Women should inform their doctor if they wish to become pregnant or think they might be pregnant. Men should not father a child while taking this medicine or for 3 months after stopping it. There is a potential for  serious side effects to an unborn child. Talk to your health care professional or pharmacist for more information. Do not breast-feed an infant while taking this medicine or for 2 weeks after stopping it. This medicine may interfere with the ability to get pregnant or to father a child. You should talk to your doctor or health care professional if you are concerned about your fertility. What side effects may I notice from receiving this medication? Side effects that you should report to your doctor or health care professional as soon as possible: allergic reactions like skin rash, itching or hives, swelling of the face, lips, or tongue breathing problems changes in vision fast, irregular heartbeat low blood pressure mouth sores pain, tingling, numbness in the hands or feet signs of decreased platelets or bleeding - bruising, pinpoint red spots on the skin, black, tarry stools, blood in the urine signs of decreased red blood cells - unusually weak or tired, feeling faint or lightheaded, falls signs of infection - fever  or chills, cough, sore throat, pain or difficulty passing urine signs and symptoms of liver injury like dark yellow or brown urine; general ill feeling or flu-like symptoms; light-colored stools; loss of appetite; nausea; right upper belly pain; unusually weak or tired; yellowing of the eyes or skin swelling of the ankles, feet, hands unusually slow heartbeat Side effects that usually do not require medical attention (report to your doctor or health care professional if they continue or are bothersome): diarrhea hair loss loss of appetite nausea, vomiting tiredness This list may not describe all possible side effects. Call your doctor for medical advice about side effects. You may report side effects to FDA at 1-800-FDA-1088. Where should I keep my medication? This drug is given in a hospital or clinic and will not be stored at home. NOTE: This sheet is a summary. It may not  cover all possible information. If you have questions about this medicine, talk to your doctor, pharmacist, or health care provider.  2022 Elsevier/Gold Standard (2017-01-28 13:03:45)

## 2021-05-09 NOTE — Progress Notes (Signed)
Nutrition follow up per patient request.  Patient is receiving infusion for Pancreas cancer s/p [facility] 2020. Patient receiving Gemcitabine and Abraxane.  Current weight documented as 188 pounds Nov 30, decreased from 200.8 pounds October, 2021.  Labs include: Glucose 276, Creatinine 1.25, Albumin 3.4, Magnesium 1.5, HGB 8.6.  Notes malaise and decreased oral intake after treatments. Weight has been trending down since February 14, 2021. Reports her blood sugars have been high and she would like nutrition information to help manage them. She takes [place] and Humalog. Dietary recall reveals patient does not like artificial sweeteners. She drinks sweet tea and will eat sweets occasionally. She occasionally drinks orange juice but not often. States she eats a bacon, egg sandwich for breakfast and a meat, vegetable and rice for late lunch and dinner is usually a protein and vegetables.  Nutrition Diagnosis: Food and Nutrition Related Knowledge Deficit related to increased blood sugars as evidenced by glucose of 276 and questions about diet.  Intervention: Reviewed importance of decreasing concentrated sweets. At the very least, pair with protein and fats at meals or snacks.  Choose higher fiber foods as tolerated.  Consider counting CHO and limit to 45 grams CHO per meal and 15 gm for snacks. Recommend discuss medication changes to help manage high blood sugars.  Monitoring, Evaluation, Goals: Patient will control CHO and reduce simple CHO to improve glycemic control.  No follow up scheduled.

## 2021-05-09 NOTE — Telephone Encounter (Signed)
Patient seen by Dr. Benay Spice today  Vitals are within treatment parameters.  Labs reviewed by Dr. Benay Spice and are not all within treatment parameters. Ok to treat with  AST 112, Alt 97  Per Dr Benay Spice   Per physician team, patient is ready for treatment and there are NO modifications to the treatment plan.

## 2021-05-09 NOTE — Progress Notes (Signed)
[place] OFFICE PROGRESS NOTE   Diagnosis: Pancreas cancer  INTERVAL HISTORY:   Alyssa Ross completed another treatment with gemcitabine/Abraxane on 04/25/2021.  No nausea, fever, rash, or neuropathy symptoms.  She has malaise for a few days following chemotherapy and does not eat as much.  She notes her blood pressure runs lower following chemotherapy.  She is taking potassium and magnesium.  Occasional diarrhea.  Mild nosebleeding.  No other bleeding.  Objective:  Vital signs in last 24 hours:  Blood pressure 120/83, pulse 100, temperature 97.8 F (36.6 C), temperature source Oral, resp. rate 18, height $RemoveBe'5\' 8"'[id number]$  (1.727 m), weight 188 lb 9.6 oz (85.5 kg), last menstrual period 09/17/2012, SpO2 100 %.    HEENT: No thrush or ulcers, hyperpigmentation Resp: Lungs clear bilaterally Cardio: Regular rate and rhythm GI: Nontender, no hepatosplenomegaly Vascular: No leg edema  Skin: Hyperpigmentation of the hands  Portacath/PICC-without erythema  Lab Results:  Lab Results  Component Value Date   WBC 5.6 05/09/2021   HGB 8.6 (L) 05/09/2021   HCT 27.1 (L) 05/09/2021   MCV 93.4 05/09/2021   PLT 572 (H) 05/09/2021   NEUTROABS 3.2 05/09/2021    CMP  Lab Results  Component Value Date   NA 140 04/25/2021   K 3.0 (L) 04/25/2021   CL 103 04/25/2021   CO2 29 04/25/2021   GLUCOSE 165 (H) 04/25/2021   BUN 8 04/25/2021   CREATININE 0.90 04/25/2021   CALCIUM 9.0 04/25/2021   PROT 6.9 04/25/2021   ALBUMIN 3.6 04/25/2021   AST 28 04/25/2021   ALT 59 (H) 04/25/2021   ALKPHOS 185 (H) 04/25/2021   BILITOT 0.5 04/25/2021   GFRNONAA >60 04/25/2021   GFRAA >60 02/07/2020    Lab Results  Component Value Date   PJS315 67 (H) 04/25/2021    Medications: I have reviewed the patient's current medications.   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 Tumor invades the duodenal wall and vascular groove,  resection margins negative, 4/34 lymph nodes positive MSI-stable, tumor showed instability in 2 loci as did adjacent normal tissue EUS FNA biopsy of pancreas mass on 07/03/2018-well-differentiated neuroendocrine tumor CTs 01/29/2019-ill-defined pancreas head mass, 5 pulmonary nodules-1 with a small amount of central cavitation, tumor abuts the left margin of the portal vein indistinct density surrounding, hepatic artery, complex cystic lesion of the right kidney, right adrenal mass-characterized as an adenoma on a [facility] MRI 12/21/2018 Netspot 03/03/2019-no focal pancreas activity, no tracer accumulation within the suspicious pulmonary nodules, left uterine mass with tracer accumulation felt to represent a leiomyoma Elevated preoperative CA 19-9--CA 19-9 186 on 01/18/2019 CT chest 04/16/2019-multiple bilateral pulmonary nodules, some with increased cavitation, stable in size Cycle 1 FOLFIRINOX 04/27/2019 Cycle 2 FOLFIRINOX 05/11/2019 Cycle 3 FOLFIRINOX 05/23/2019 Cycle 4 FOLFIRINOX 06/08/2019 Cycle 5 FOLFIRINOX 06/22/2019 CT chest 07/02/2019-stable size of bilateral pulmonary nodules.  Dominant cavitary lesions in both lungs show increased cavitation with thinner walls.  Stable 2.1 cm right adrenal nodule. 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 No definite evidence of metastatic disease in the abdomen or pelvis. Markedly enlarged and heterogeneous appearing uterus, likely to represent multifocal fibroids. 1 of these  lesions appears to be an exophytic subserosal fibroid in the posterior lateral  aspect of the uterine body on the left side although this comes in close proximity to the left adnexa such that a primary ovarian lesion is difficult to completely exclude. CTs 02/07/2020-slight enlargement of bilateral lung nodules, some are cavitary, no evidence of metastatic disease in the abdomen or pelvis, stable right adrenal nodule, uterine fibroids CTs 04/26/2020-mild enlargement of pulmonary nodules, slight increase in trace pelvic fluid, new soft tissue thickening inferior to the cecal tip suspicious for peritoneal metastasis CT 05/26/2020-improved appearance of soft tissue at the inferior tip of the cecum, mildly thickened short appendix-findings suggestive of resolving appendicitis, stable small bibasilar pulmonary nodules, fibroids Plan biopsy of right cecal tip soft tissue canceled secondary to radiologic improvement CT chest 08/02/2020-enlargement and progressive cavitation of multiple bilateral lung nodules.  Some new nodules are present. CTs 10/24/2020- increase in size of pulmonary nodules, no new nodules, no evidence of metastatic disease in the abdomen, stable right adrenal nodule CT 01/09/2021-slight interval enlargement of pulmonary nodules, stable right adrenal nodule Navigation bronchoscopy 01/30/2021-left lower lobe cavitary nodule FNA-adenocarcinoma, brushing-adenocarcinoma.  Left lower lobe lavage-adenocarcinoma.  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  She will complete another treat with gemcitabine/Abraxane today.  She will return for an office visit and cycle 5 chemotherapy in 2 weeks.  She will be scheduled for a restaging CT after cycle 5.  She continues potassium and magnesium replacement.  She does not wish to receive IV magnesium today.  She will hold her blood pressure medication for 3 to 4 days following chemotherapy.  Alyssa Ross will meet with the cancer center nutritionist today.  She will receive an influenza vaccine today. She has developed anemia secondary to chemotherapy.  We will consider a Red cell transfusion if the hemoglobin falls further. [name], MD  05/09/2021  9:29 AM

## 2021-05-09 NOTE — Patient Instructions (Signed)

## 2021-05-09 NOTE — Progress Notes (Signed)
Patient presents for treatment. RN assessment completed along with the following:  Labs/vitals reviewed - Yes, and Per Dr. Benay Spice, ok to treat with Mg 1.5, AST 112, ALT 97.     Weight within 10% of previous measurement - Yes Oncology Treatment Attestation completed for current therapy- Yes, on date 03/02/21 Informed consent completed and reflects current therapy/intent - Yes, on date 03/20/21             Provider progress note reviewed - Yes, today's provider note was reviewed. Treatment/Antibody/Supportive plan reviewed - Yes, and there are no adjustments needed for today's treatment. S&H and other orders reviewed - Yes, and Patient to receive flu vaccination today Previous treatment date reviewed - Yes, and the appropriate amount of time has elapsed between treatments. Clinic Hand Off Received from - Lenox Ponds, LPN  Patient to proceed with treatment.

## 2021-05-10 LAB — CANCER ANTIGEN 19-9: CA 19-9: 56 U/mL — ABNORMAL HIGH (ref 0–35)

## 2021-05-19 ENCOUNTER — Other Ambulatory Visit: Payer: Self-pay | Admitting: Oncology

## 2021-05-23 ENCOUNTER — Other Ambulatory Visit: Payer: Self-pay

## 2021-05-23 ENCOUNTER — Inpatient Hospital Stay: Payer: BC Managed Care – PPO | Attending: Genetic Counselor

## 2021-05-23 ENCOUNTER — Inpatient Hospital Stay: Payer: BC Managed Care – PPO

## 2021-05-23 ENCOUNTER — Ambulatory Visit: Payer: BC Managed Care – PPO

## 2021-05-23 ENCOUNTER — Inpatient Hospital Stay (HOSPITAL_BASED_OUTPATIENT_CLINIC_OR_DEPARTMENT_OTHER): Payer: BC Managed Care – PPO | Admitting: Oncology

## 2021-05-23 ENCOUNTER — Encounter: Payer: Self-pay | Admitting: Oncology

## 2021-05-23 VITALS — BP 114/76 | HR 91 | Temp 97.8°F | Resp 18 | Ht 68.0 in | Wt 187.4 lb

## 2021-05-23 VITALS — BP 122/80 | HR 85 | Temp 98.3°F | Resp 18

## 2021-05-23 DIAGNOSIS — N6321 Unspecified lump in the left breast, upper outer quadrant: Secondary | ICD-10-CM | POA: Insufficient documentation

## 2021-05-23 DIAGNOSIS — Z905 Acquired absence of kidney: Secondary | ICD-10-CM | POA: Diagnosis not present

## 2021-05-23 DIAGNOSIS — Z85528 Personal history of other malignant neoplasm of kidney: Secondary | ICD-10-CM | POA: Insufficient documentation

## 2021-05-23 DIAGNOSIS — I10 Essential (primary) hypertension: Secondary | ICD-10-CM | POA: Insufficient documentation

## 2021-05-23 DIAGNOSIS — C250 Malignant neoplasm of head of pancreas: Secondary | ICD-10-CM | POA: Diagnosis not present

## 2021-05-23 DIAGNOSIS — Z5111 Encounter for antineoplastic chemotherapy: Secondary | ICD-10-CM | POA: Diagnosis not present

## 2021-05-23 DIAGNOSIS — K760 Fatty (change of) liver, not elsewhere classified: Secondary | ICD-10-CM | POA: Diagnosis not present

## 2021-05-23 DIAGNOSIS — E1140 Type 2 diabetes mellitus with diabetic neuropathy, unspecified: Secondary | ICD-10-CM | POA: Insufficient documentation

## 2021-05-23 DIAGNOSIS — Z90411 Acquired partial absence of pancreas: Secondary | ICD-10-CM | POA: Insufficient documentation

## 2021-05-23 DIAGNOSIS — R918 Other nonspecific abnormal finding of lung field: Secondary | ICD-10-CM | POA: Diagnosis not present

## 2021-05-23 DIAGNOSIS — Z923 Personal history of irradiation: Secondary | ICD-10-CM | POA: Insufficient documentation

## 2021-05-23 LAB — CBC WITH DIFFERENTIAL (CANCER CENTER ONLY)
Abs Immature Granulocytes: 0.01 10*3/uL (ref 0.00–0.07)
Basophils Absolute: 0 10*3/uL (ref 0.0–0.1)
Basophils Relative: 1 %
Eosinophils Absolute: 0.3 10*3/uL (ref 0.0–0.5)
Eosinophils Relative: 4 %
HCT: 25.7 % — ABNORMAL LOW (ref 36.0–46.0)
Hemoglobin: 8.2 g/dL — ABNORMAL LOW (ref 12.0–15.0)
Immature Granulocytes: 0 %
Lymphocytes Relative: 27 %
Lymphs Abs: 1.8 10*3/uL (ref 0.7–4.0)
MCH: 29.3 pg (ref 26.0–34.0)
MCHC: 31.9 g/dL (ref 30.0–36.0)
MCV: 91.8 fL (ref 80.0–100.0)
Monocytes Absolute: 0.8 10*3/uL (ref 0.1–1.0)
Monocytes Relative: 12 %
Neutro Abs: 3.7 10*3/uL (ref 1.7–7.7)
Neutrophils Relative %: 56 %
Platelet Count: 445 10*3/uL — ABNORMAL HIGH (ref 150–400)
RBC: 2.8 MIL/uL — ABNORMAL LOW (ref 3.87–5.11)
RDW: 13.2 % (ref 11.5–15.5)
WBC Count: 6.5 10*3/uL (ref 4.0–10.5)
nRBC: 0 % (ref 0.0–0.2)

## 2021-05-23 LAB — CMP (CANCER CENTER ONLY)
ALT: 55 U/L — ABNORMAL HIGH (ref 0–44)
AST: 54 U/L — ABNORMAL HIGH (ref 15–41)
Albumin: 3 g/dL — ABNORMAL LOW (ref 3.5–5.0)
Alkaline Phosphatase: 139 U/L — ABNORMAL HIGH (ref 38–126)
Anion gap: 8 (ref 5–15)
BUN: 7 mg/dL (ref 6–20)
CO2: 27 mmol/L (ref 22–32)
Calcium: 8.3 mg/dL — ABNORMAL LOW (ref 8.9–10.3)
Chloride: 103 mmol/L (ref 98–111)
Creatinine: 0.8 mg/dL (ref 0.44–1.00)
GFR, Estimated: >60 mL/min (ref >60)
Glucose, Bld: 152 mg/dL — ABNORMAL HIGH (ref 70–99)
Potassium: 3 mmol/L — ABNORMAL LOW (ref 3.5–5.1)
Sodium: 138 mmol/L (ref 135–145)
Total Bilirubin: 0.4 mg/dL (ref 0.3–1.2)
Total Protein: 6 g/dL — ABNORMAL LOW (ref 6.5–8.1)

## 2021-05-23 LAB — PREPARE RBC (CROSSMATCH)

## 2021-05-23 LAB — SAMPLE TO BLOOD BANK

## 2021-05-23 LAB — MAGNESIUM: Magnesium: 1.4 mg/dL — ABNORMAL LOW (ref 1.7–2.4)

## 2021-05-23 MED ORDER — PACLITAXEL PROTEIN-BOUND CHEMO INJECTION 100 MG
100.0000 mg/m2 | Freq: Once | INTRAVENOUS | Status: AC
  Administered 2021-05-23: 12:00:00 200 mg via INTRAVENOUS
  Filled 2021-05-23: qty 40

## 2021-05-23 MED ORDER — HEPARIN SOD (PORK) LOCK FLUSH 100 UNIT/ML IV SOLN
500.0000 [IU] | Freq: Once | INTRAVENOUS | Status: AC | PRN
  Administered 2021-05-23: 13:00:00 500 [IU]

## 2021-05-23 MED ORDER — POTASSIUM CHLORIDE 10 MEQ/100ML IV SOLN
10.0000 meq | INTRAVENOUS | Status: AC
  Administered 2021-05-23 (×2): 10 meq via INTRAVENOUS
  Filled 2021-05-23: qty 100

## 2021-05-23 MED ORDER — MAGNESIUM SULFATE 2 GM/50ML IV SOLN
2.0000 g | Freq: Once | INTRAVENOUS | Status: AC
  Administered 2021-05-23: 10:00:00 2 g via INTRAVENOUS
  Filled 2021-05-23: qty 50

## 2021-05-23 MED ORDER — SODIUM CHLORIDE 0.9 % IV SOLN
1000.0000 mg/m2 | Freq: Once | INTRAVENOUS | Status: AC
  Administered 2021-05-23: 13:00:00 2052 mg via INTRAVENOUS
  Filled 2021-05-23: qty 52.6

## 2021-05-23 MED ORDER — SODIUM CHLORIDE 0.9 % IV SOLN: Freq: Once | INTRAVENOUS | Status: AC

## 2021-05-23 MED ORDER — SODIUM CHLORIDE 0.9% FLUSH
10.0000 mL | INTRAVENOUS | Status: DC | PRN
  Administered 2021-05-23: 13:00:00 10 mL

## 2021-05-23 MED ORDER — PROCHLORPERAZINE MALEATE 10 MG PO TABS
10.0000 mg | ORAL_TABLET | Freq: Once | ORAL | Status: AC
  Administered 2021-05-23: 10:00:00 10 mg via ORAL
  Filled 2021-05-23: qty 1

## 2021-05-23 MED ORDER — LIDOCAINE-PRILOCAINE 2.5-2.5 % EX CREA: 1.0000 "application " | TOPICAL_CREAM | CUTANEOUS | 5 refills | Status: DC

## 2021-05-23 NOTE — Progress Notes (Signed)
[place] OFFICE PROGRESS NOTE   Diagnosis: Pancreas cancer  INTERVAL HISTORY:   Alyssa Ross completed another treat with gemcitabine and Abraxane on 05/09/2021.  No nausea, rash, fever, or neuropathy symptoms.  She reports malaise.  No bleeding.  No dyspnea or cough.  Objective:  Vital signs in last 24 hours:  Blood pressure 114/76, pulse 91, temperature 97.8 F (36.6 C), temperature source Oral, resp. rate 18, height _0  (1.727 m), weight 187 lb 6.4 oz (85 kg), last menstrual period 09/17/2012, SpO2 100 %.    HEENT: No thrush or ulcers Resp: Lungs clear bilaterally Cardio: Regular rate and rhythm GI: No hepatosplenomegaly, nontender Vascular: No leg edema  Skin: Mild hyperpigmentation of the hands  Portacath/PICC-without erythema  Lab Results:  Lab Results  Component Value Date   WBC 6.5 05/23/2021   HGB 8.2 (L) 05/23/2021   HCT 25.7 (L) 05/23/2021   MCV 91.8 05/23/2021   PLT 445 (H) 05/23/2021   NEUTROABS 3.7 05/23/2021    CMP  Lab Results  Component Value Date   NA 137 05/09/2021   K 3.8 05/09/2021   CL 104 05/09/2021   CO2 27 05/09/2021   GLUCOSE 276 (H) 05/09/2021   BUN 10 05/09/2021   CREATININE 1.25 (H) 05/09/2021   CALCIUM 9.2 05/09/2021   PROT 6.2 (L) 05/09/2021   ALBUMIN 3.4 (L) 05/09/2021   AST 112 (H) 05/09/2021   ALT 97 (H) 05/09/2021   ALKPHOS 230 (H) 05/09/2021   BILITOT 0.4 05/09/2021   GFRNONAA 51 (L) 05/09/2021   GFRAA >60 02/07/2020    Lab Results  Component Value Date   CAN199 56 (H) 05/09/2021     Medications: I have reviewed the patient's current medications.   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 Tumor invades the duodenal wall and vascular groove, resection margins negative, 4/34 lymph nodes positive MSI-stable, tumor showed instability in 2 loci as did adjacent normal tissue EUS FNA biopsy of pancreas mass on 07/03/2018-well-differentiated  neuroendocrine tumor CTs 01/29/2019-ill-defined pancreas head mass, 5 pulmonary nodules-1 with a small amount of central cavitation, tumor abuts the left margin of the portal vein indistinct density surrounding, hepatic artery, complex cystic lesion of the right kidney, right adrenal mass-characterized as an adenoma on a [facility] MRI 12/21/2018 [facility] 03/03/2019-no focal pancreas activity, no tracer accumulation within the suspicious pulmonary nodules, left uterine mass with tracer accumulation felt to represent a leiomyoma Elevated preoperative CA 19-9--CA 19-9 186 on 01/18/2019 CT chest 04/16/2019-multiple bilateral pulmonary nodules, some with increased cavitation, stable in size Cycle 1 FOLFIRINOX 04/27/2019 Cycle 2 FOLFIRINOX 05/11/2019 Cycle 3 FOLFIRINOX 05/23/2019 Cycle 4 FOLFIRINOX 06/08/2019 Cycle 5 FOLFIRINOX 06/22/2019 CT chest 07/02/2019-stable size of bilateral pulmonary nodules.  Dominant cavitary lesions in both lungs show increased cavitation with thinner walls.  Stable 2.1 cm right adrenal nodule. 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 No definite evidence of metastatic disease in the abdomen or pelvis. Markedly enlarged and heterogeneous appearing uterus, likely to represent multifocal fibroids. 1 of these lesions appears to be an exophytic subserosal fibroid in the posterior lateral aspect of the uterine body on the left side although this comes in  close proximity to the left adnexa such that a primary ovarian lesion is difficult to completely exclude. CTs 02/07/2020-slight  enlargement of bilateral lung nodules, some are cavitary, no evidence of metastatic disease in the abdomen or pelvis, stable right adrenal nodule, uterine fibroids CTs 04/26/2020-mild enlargement of pulmonary nodules, slight increase in trace pelvic fluid, new soft tissue thickening inferior to the cecal tip suspicious for peritoneal metastasis CT 05/26/2020-improved appearance of soft tissue at the inferior tip of the cecum, mildly thickened short appendix-findings suggestive of resolving appendicitis, stable small bibasilar pulmonary nodules, fibroids Plan biopsy of right cecal tip soft tissue canceled secondary to radiologic improvement CT chest 08/02/2020-enlargement and progressive cavitation of multiple bilateral lung nodules.  Some new nodules are present. CTs 10/24/2020- increase in size of pulmonary nodules, no new nodules, no evidence of metastatic disease in the abdomen, stable right adrenal nodule CT 01/09/2021-slight interval enlargement of pulmonary nodules, stable right adrenal nodule Navigation bronchoscopy 01/30/2021-left lower lobe cavitary nodule FNA-adenocarcinoma, brushing-adenocarcinoma.  Left lower lobe lavage-adenocarcinoma.  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  She has increased malaise, likely related to progressive anemia.  She agrees to a Red cell transfusion.  She will receive 1 unit of packed red blood cells today. She will complete cycle 5 gemcitabine/Abraxane today.  Alyssa Ross will return for an office visit and restaging CT in 2 weeks.  Alyssa Coder, MD  05/23/2021  8:32 AM

## 2021-05-23 NOTE — Patient Instructions (Signed)

## 2021-05-23 NOTE — Patient Instructions (Signed)
**Maintain Magnesium Oxide at three times daily. INCREASE you oral potassium to four times daily due to low potassium level today.  [facility]  Discharge Instructions: Thank you for choosing [facility] to provide your oncology and hematology care.   If you have a lab appointment with the [facility], please go directly to the [facility] and check in at the registration area.   Wear comfortable clothing and clothing appropriate for easy access to any Portacath or PICC line.   We strive to give you quality time with your provider. You may need to reschedule your appointment if you arrive late (15 or more minutes).  Arriving late affects you and other patients whose appointments are after yours.  Also, if you miss three or more appointments without notifying the office, you may be dismissed from the clinic at the providers discretion.      For prescription refill requests, have your pharmacy contact our office and allow 72 hours for refills to be completed.    Today you received the following chemotherapy and/or immunotherapy agents Gemzar, abraxane. Potassium and Magnesium       To help prevent nausea and vomiting after your treatment, we encourage you to take your nausea medication as directed.  BELOW ARE SYMPTOMS THAT SHOULD BE REPORTED IMMEDIATELY: *FEVER GREATER THAN 100.4 F (38 C) OR HIGHER *CHILLS OR SWEATING *NAUSEA AND VOMITING THAT IS NOT CONTROLLED WITH YOUR NAUSEA MEDICATION *UNUSUAL SHORTNESS OF BREATH *UNUSUAL BRUISING OR BLEEDING *URINARY PROBLEMS (pain or burning when urinating, or frequent urination) *BOWEL PROBLEMS (unusual diarrhea, constipation, pain near the anus) TENDERNESS IN MOUTH AND THROAT WITH OR WITHOUT PRESENCE OF ULCERS (sore throat, sores in mouth, or a toothache) UNUSUAL RASH, SWELLING OR PAIN  UNUSUAL VAGINAL DISCHARGE OR ITCHING   Items with * indicate a potential emergency and should be followed up as  soon as possible or go to the Emergency Department if any problems should occur.  Please show the CHEMOTHERAPY ALERT CARD or IMMUNOTHERAPY ALERT CARD at check-in to the Emergency Department and triage nurse.  Should you have questions after your visit or need to cancel or reschedule your appointment, please contact [facility]  Dept: [phone]  and follow the prompts.  Office hours are 8:00 a.m. to 4:30 p.m. Monday - Friday. Please note that voicemails left after 4:00 p.m. may not be returned until the following business day.  We are closed weekends and major holidays. You have access to a nurse at all times for urgent questions. Please call the main number to the clinic Dept: [phone] and follow the prompts.   For any non-urgent questions, you may also contact your provider using MyChart. We now offer e-Visits for anyone 62 and older to request care online for non-urgent symptoms. For details visit [email or web address].   Also download the MyChart app! Go to the app store, search "MyChart", open the app, select [facility], and log in with your MyChart username and password.  Due to Covid, a mask is required upon entering the hospital/clinic. If you do not have a mask, one will be given to you upon arrival. For doctor visits, patients may have 1 support person aged 24 or older with them. For treatment visits, patients cannot have anyone with them due to current Covid guidelines and our immunocompromised population.   Gemcitabine injection What is this medication? GEMCITABINE (jem SYE ta been) is a chemotherapy drug. This medicine is used to treat many  types of cancer like breast cancer, lung cancer, pancreatic cancer, and ovarian cancer. This medicine may be used for other purposes; ask your health care provider or pharmacist if you have questions. COMMON BRAND NAME(S): Gemzar, Infugem What should I tell my care team before I take this medication? They need to  know if you have any of these conditions: blood disorders infection kidney disease liver disease lung or breathing disease, like asthma recent or ongoing radiation therapy an unusual or allergic reaction to gemcitabine, other chemotherapy, other medicines, foods, dyes, or preservatives pregnant or trying to get pregnant breast-feeding How should I use this medication? This drug is given as an infusion into a vein. It is administered in a hospital or clinic by a specially trained health care professional. Talk to your pediatrician regarding the use of this medicine in children. Special care may be needed. Overdosage: If you think you have taken too much of this medicine contact a poison control center or emergency room at once. NOTE: This medicine is only for you. Do not share this medicine with others. What if I miss a dose? It is important not to miss your dose. Call your doctor or health care professional if you are unable to keep an appointment. What may interact with this medication? medicines to increase blood counts like filgrastim, pegfilgrastim, sargramostim some other chemotherapy drugs like cisplatin vaccines Talk to your doctor or health care professional before taking any of these medicines: acetaminophen aspirin ibuprofen ketoprofen naproxen This list may not describe all possible interactions. Give your health care provider a list of all the medicines, herbs, non-prescription drugs, or dietary supplements you use. Also tell them if you smoke, drink alcohol, or use illegal drugs. Some items may interact with your medicine. What should I watch for while using this medication? Visit your doctor for checks on your progress. This drug may make you feel generally unwell. This is not uncommon, as chemotherapy can affect healthy cells as well as cancer cells. Report any side effects. Continue your course of treatment even though you feel ill unless your doctor tells you to  stop. In some cases, you may be given additional medicines to help with side effects. Follow all directions for their use. Call your doctor or health care professional for advice if you get a fever, chills or sore throat, or other symptoms of a cold or flu. Do not treat yourself. This drug decreases your body's ability to fight infections. Try to avoid being around people who are sick. This medicine may increase your risk to bruise or bleed. Call your doctor or health care professional if you notice any unusual bleeding. Be careful brushing and flossing your teeth or using a toothpick because you may get an infection or bleed more easily. If you have any dental work done, tell your dentist you are receiving this medicine. Avoid taking products that contain aspirin, acetaminophen, ibuprofen, naproxen, or ketoprofen unless instructed by your doctor. These medicines may hide a fever. Do not become pregnant while taking this medicine or for 6 months after stopping it. Women should inform their doctor if they wish to become pregnant or think they might be pregnant. Men should not father a child while taking this medicine and for 3 months after stopping it. There is a potential for serious side effects to an unborn child. Talk to your health care professional or pharmacist for more information. Do not breast-feed an infant while taking this medicine or for at least 1 week after  stopping it. Men should inform their doctors if they wish to father a child. This medicine may lower sperm counts. Talk with your doctor or health care professional if you are concerned about your fertility. What side effects may I notice from receiving this medication? Side effects that you should report to your doctor or health care professional as soon as possible: allergic reactions like skin rash, itching or hives, swelling of the face, lips, or tongue breathing problems pain, redness, or irritation at site where injected signs  and symptoms of a dangerous change in heartbeat or heart rhythm like chest pain; dizziness; fast or irregular heartbeat; palpitations; feeling faint or lightheaded, falls; breathing problems signs of decreased platelets or bleeding - bruising, pinpoint red spots on the skin, black, tarry stools, blood in the urine signs of decreased red blood cells - unusually weak or tired, feeling faint or lightheaded, falls signs of infection - fever or chills, cough, sore throat, pain or difficulty passing urine signs and symptoms of kidney injury like trouble passing urine or change in the amount of urine signs and symptoms of liver injury like dark yellow or brown urine; general ill feeling or flu-like symptoms; light-colored stools; loss of appetite; nausea; right upper belly pain; unusually weak or tired; yellowing of the eyes or skin swelling of ankles, feet, hands Side effects that usually do not require medical attention (report to your doctor or health care professional if they continue or are bothersome): constipation diarrhea hair loss loss of appetite nausea rash vomiting This list may not describe all possible side effects. Call your doctor for medical advice about side effects. You may report side effects to FDA at 1-800-FDA-1088. Where should I keep my medication? This drug is given in a hospital or clinic and will not be stored at home. NOTE: This sheet is a summary. It may not cover all possible information. If you have questions about this medicine, talk to your doctor, pharmacist, or health care provider.  2022 Elsevier/Gold Standard (2017-08-20 18:06:11)  Nanoparticle Albumin-Bound Paclitaxel injection What is this medication? NANOPARTICLE ALBUMIN-BOUND PACLITAXEL (Na no PAHR ti kuhl al BYOO muhn-bound PAK li TAX el) is a chemotherapy drug. It targets fast dividing cells, like cancer cells, and causes these cells to die. This medicine is used to treat advanced breast cancer, lung  cancer, and pancreatic cancer. This medicine may be used for other purposes; ask your health care provider or pharmacist if you have questions. COMMON BRAND NAME(S): Abraxane What should I tell my care team before I take this medication? They need to know if you have any of these conditions: kidney disease liver disease low blood counts, like low white cell, platelet, or red cell counts lung or breathing disease, like asthma tingling of the fingers or toes, or other nerve disorder an unusual or allergic reaction to paclitaxel, albumin, other chemotherapy, other medicines, foods, dyes, or preservatives pregnant or trying to get pregnant breast-feeding How should I use this medication? This drug is given as an infusion into a vein. It is administered in a hospital or clinic by a specially trained health care professional. Talk to your pediatrician regarding the use of this medicine in children. Special care may be needed. Overdosage: If you think you have taken too much of this medicine contact a poison control center or emergency room at once. NOTE: This medicine is only for you. Do not share this medicine with others. What if I miss a dose? It is important not to miss your  dose. Call your doctor or health care professional if you are unable to keep an appointment. What may interact with this medication? This medicine may interact with the following medications: antiviral medicines for hepatitis, HIV or AIDS certain antibiotics like erythromycin and clarithromycin certain medicines for fungal infections like ketoconazole and itraconazole certain medicines for seizures like carbamazepine, phenobarbital, phenytoin gemfibrozil nefazodone rifampin St. John's wort This list may not describe all possible interactions. Give your health care provider a list of all the medicines, herbs, non-prescription drugs, or dietary supplements you use. Also tell them if you smoke, drink alcohol, or use  illegal drugs. Some items may interact with your medicine. What should I watch for while using this medication? Your condition will be monitored carefully while you are receiving this medicine. You will need important blood work done while you are taking this medicine. This medicine can cause serious allergic reactions. If you experience allergic reactions like skin rash, itching or hives, swelling of the face, lips, or tongue, tell your doctor or health care professional right away. In some cases, you may be given additional medicines to help with side effects. Follow all directions for their use. This drug may make you feel generally unwell. This is not uncommon, as chemotherapy can affect healthy cells as well as cancer cells. Report any side effects. Continue your course of treatment even though you feel ill unless your doctor tells you to stop. Call your doctor or health care professional for advice if you get a fever, chills or sore throat, or other symptoms of a cold or flu. Do not treat yourself. This drug decreases your body's ability to fight infections. Try to avoid being around people who are sick. This medicine may increase your risk to bruise or bleed. Call your doctor or health care professional if you notice any unusual bleeding. Be careful brushing and flossing your teeth or using a toothpick because you may get an infection or bleed more easily. If you have any dental work done, tell your dentist you are receiving this medicine. Avoid taking products that contain aspirin, acetaminophen, ibuprofen, naproxen, or ketoprofen unless instructed by your doctor. These medicines may hide a fever. Do not become pregnant while taking this medicine or for 6 months after stopping it. Women should inform their doctor if they wish to become pregnant or think they might be pregnant. Men should not father a child while taking this medicine or for 3 months after stopping it. There is a potential for  serious side effects to an unborn child. Talk to your health care professional or pharmacist for more information. Do not breast-feed an infant while taking this medicine or for 2 weeks after stopping it. This medicine may interfere with the ability to get pregnant or to father a child. You should talk to your doctor or health care professional if you are concerned about your fertility. What side effects may I notice from receiving this medication? Side effects that you should report to your doctor or health care professional as soon as possible: allergic reactions like skin rash, itching or hives, swelling of the face, lips, or tongue breathing problems changes in vision fast, irregular heartbeat low blood pressure mouth sores pain, tingling, numbness in the hands or feet signs of decreased platelets or bleeding - bruising, pinpoint red spots on the skin, black, tarry stools, blood in the urine signs of decreased red blood cells - unusually weak or tired, feeling faint or lightheaded, falls signs of infection - fever  or chills, cough, sore throat, pain or difficulty passing urine signs and symptoms of liver injury like dark yellow or brown urine; general ill feeling or flu-like symptoms; light-colored stools; loss of appetite; nausea; right upper belly pain; unusually weak or tired; yellowing of the eyes or skin swelling of the ankles, feet, hands unusually slow heartbeat Side effects that usually do not require medical attention (report to your doctor or health care professional if they continue or are bothersome): diarrhea hair loss loss of appetite nausea, vomiting tiredness This list may not describe all possible side effects. Call your doctor for medical advice about side effects. You may report side effects to FDA at 1-800-FDA-1088. Where should I keep my medication? This drug is given in a hospital or clinic and will not be stored at home. NOTE: This sheet is a summary. It may not  cover all possible information. If you have questions about this medicine, talk to your doctor, pharmacist, or health care provider.  2022 Elsevier/Gold Standard (2017-01-28 13:03:45)

## 2021-05-23 NOTE — Progress Notes (Signed)
Patient presents for treatment. RN assessment completed along with the following:  Labs/vitals reviewed - Yes, and per Dr. Benay Spice, ok to treat with K 3.0 and Mg 1.4    Weight within 10% of previous measurement - Yes Oncology Treatment Attestation completed for current therapy- Yes, on date 03/02/21 Informed consent completed and reflects current therapy/intent - Yes, on date 03/28/21             Provider progress note reviewed - Yes, today's provider note was reviewed. Treatment/Antibody/Supportive plan reviewed - Yes, and there are no adjustments needed for today's treatment. S&H and other orders reviewed - Yes, and verbal orders received for IV Mg/K replacement.  Previous treatment date reviewed - Yes, and the appropriate amount of time has elapsed between treatments.  Patient to proceed with treatment.

## 2021-05-23 NOTE — Addendum Note (Signed)
Addended by: Velna Hatchet on: 05/23/2021 10:53 AM   Modules accepted: Orders

## 2021-05-24 ENCOUNTER — Encounter: Payer: Self-pay | Admitting: Oncology

## 2021-05-24 ENCOUNTER — Inpatient Hospital Stay: Payer: BC Managed Care – PPO

## 2021-05-24 DIAGNOSIS — Z85528 Personal history of other malignant neoplasm of kidney: Secondary | ICD-10-CM | POA: Diagnosis not present

## 2021-05-24 DIAGNOSIS — C250 Malignant neoplasm of head of pancreas: Secondary | ICD-10-CM | POA: Diagnosis not present

## 2021-05-24 DIAGNOSIS — I10 Essential (primary) hypertension: Secondary | ICD-10-CM | POA: Diagnosis not present

## 2021-05-24 DIAGNOSIS — Z90411 Acquired partial absence of pancreas: Secondary | ICD-10-CM | POA: Diagnosis not present

## 2021-05-24 DIAGNOSIS — K760 Fatty (change of) liver, not elsewhere classified: Secondary | ICD-10-CM | POA: Diagnosis not present

## 2021-05-24 DIAGNOSIS — E1140 Type 2 diabetes mellitus with diabetic neuropathy, unspecified: Secondary | ICD-10-CM | POA: Diagnosis not present

## 2021-05-24 DIAGNOSIS — R918 Other nonspecific abnormal finding of lung field: Secondary | ICD-10-CM | POA: Diagnosis not present

## 2021-05-24 DIAGNOSIS — N6321 Unspecified lump in the left breast, upper outer quadrant: Secondary | ICD-10-CM | POA: Diagnosis not present

## 2021-05-24 DIAGNOSIS — Z923 Personal history of irradiation: Secondary | ICD-10-CM | POA: Diagnosis not present

## 2021-05-24 DIAGNOSIS — Z5111 Encounter for antineoplastic chemotherapy: Secondary | ICD-10-CM | POA: Diagnosis not present

## 2021-05-24 DIAGNOSIS — Z905 Acquired absence of kidney: Secondary | ICD-10-CM | POA: Diagnosis not present

## 2021-05-24 LAB — CANCER ANTIGEN 19-9: CA 19-9: 45 U/mL — ABNORMAL HIGH (ref 0–35)

## 2021-05-24 MED ORDER — HEPARIN SOD (PORK) LOCK FLUSH 100 UNIT/ML IV SOLN
500.0000 [IU] | Freq: Every day | INTRAVENOUS | Status: AC | PRN
  Administered 2021-05-24: 500 [IU]

## 2021-05-24 MED ORDER — SODIUM CHLORIDE 0.9% IV SOLUTION
250.0000 mL | Freq: Once | INTRAVENOUS | Status: AC
  Administered 2021-05-24: 250 mL via INTRAVENOUS

## 2021-05-24 MED ORDER — SODIUM CHLORIDE 0.9% FLUSH
10.0000 mL | INTRAVENOUS | Status: AC | PRN
  Administered 2021-05-24: 10 mL

## 2021-05-24 NOTE — Patient Instructions (Signed)
[facility]   Discharge Instructions: Thank you for choosing [facility] to provide your oncology and hematology care.   If you have a lab appointment with the [facility], please go directly to the [facility] and check in at the registration area.   Wear comfortable clothing and clothing appropriate for easy access to any Portacath or PICC line.   We strive to give you quality time with your provider. You may need to reschedule your appointment if you arrive late (15 or more minutes).  Arriving late affects you and other patients whose appointments are after yours.  Also, if you miss three or more appointments without notifying the office, you may be dismissed from the clinic at the providers discretion.      For prescription refill requests, have your pharmacy contact our office and allow 72 hours for refills to be completed.    Today you received the following: Blood transfusion   To help prevent nausea and vomiting after your treatment, we encourage you to take your nausea medication as directed.  BELOW ARE SYMPTOMS THAT SHOULD BE REPORTED IMMEDIATELY: *FEVER GREATER THAN 100.4 F (38 C) OR HIGHER *CHILLS OR SWEATING *NAUSEA AND VOMITING THAT IS NOT CONTROLLED WITH YOUR NAUSEA MEDICATION *UNUSUAL SHORTNESS OF BREATH *UNUSUAL BRUISING OR BLEEDING *URINARY PROBLEMS (pain or burning when urinating, or frequent urination) *BOWEL PROBLEMS (unusual diarrhea, constipation, pain near the anus) TENDERNESS IN MOUTH AND THROAT WITH OR WITHOUT PRESENCE OF ULCERS (sore throat, sores in mouth, or a toothache) UNUSUAL RASH, SWELLING OR PAIN  UNUSUAL VAGINAL DISCHARGE OR ITCHING   Items with * indicate a potential emergency and should be followed up as soon as possible or go to the Emergency Department if any problems should occur.  Please show the CHEMOTHERAPY ALERT CARD or IMMUNOTHERAPY ALERT CARD at check-in to the Emergency Department and triage  nurse.  Should you have questions after your visit or need to cancel or reschedule your appointment, please contact [facility]  Dept: [phone]  and follow the prompts.  Office hours are 8:00 a.m. to 4:30 p.m. Monday - Friday. Please note that voicemails left after 4:00 p.m. may not be returned until the following business day.  We are closed weekends and major holidays. You have access to a nurse at all times for urgent questions. Please call the main number to the clinic Dept: [phone] and follow the prompts.   For any non-urgent questions, you may also contact your provider using MyChart. We now offer e-Visits for anyone 52 and older to request care online for non-urgent symptoms. For details visit [email or web address].   Also download the MyChart app! Go to the app store, search "MyChart", open the app, select [facility], and log in with your MyChart username and password.  Due to Covid, a mask is required upon entering the hospital/clinic. If you do not have a mask, one will be given to you upon arrival. For doctor visits, patients may have 1 support person aged 64 or older with them. For treatment visits, patients cannot have anyone with them due to current Covid guidelines and our immunocompromised population.   Blood Transfusion, Adult, Care After This sheet gives you information about how to care for yourself after your procedure. Your doctor may also give you more specific instructions. If you have problems or questions, contact your doctor. What can I expect after the procedure? After the procedure, it is common to have: Bruising and  soreness at the IV site. A headache. Follow these instructions at home: Insertion site care   Follow instructions from your doctor about how to take care of your insertion site. This is where an IV tube was put into your vein. Make sure you: Wash your hands with soap and water before and after you change your bandage  (dressing). If you cannot use soap and water, use hand sanitizer. Change your bandage as told by your doctor. Check your insertion site every day for signs of infection. Check for: Redness, swelling, or pain. Bleeding from the site. Warmth. Pus or a bad smell. General instructions Take over-the-counter and prescription medicines only as told by your doctor. Rest as told by your doctor. Go back to your normal activities as told by your doctor. Keep all follow-up visits as told by your doctor. This is important. Contact a doctor if: You have itching or red, swollen areas of skin (hives). You feel worried or nervous (anxious). You feel weak after doing your normal activities. You have redness, swelling, warmth, or pain around the insertion site. You have blood coming from the insertion site, and the blood does not stop with pressure. You have pus or a bad smell coming from the insertion site. Get help right away if: You have signs of a serious reaction. This may be coming from an allergy or the body's defense system (immune system). Signs include: Trouble breathing or shortness of breath. Swelling of the face or feeling warm (flushed). Fever or chills. Head, chest, or back pain. Dark pee (urine) or blood in the pee. Widespread rash. Fast heartbeat. Feeling dizzy or light-headed. You may receive your blood transfusion in an outpatient setting. If so, you will be told whom to contact to report any reactions. These symptoms may be an emergency. Do not wait to see if the symptoms will go away. Get medical help right away. Call your local emergency services (911 in the [place] Do not drive yourself to the hospital. Summary Bruising and soreness at the IV site are common. Check your insertion site every day for signs of infection. Rest as told by your doctor. Go back to your normal activities as told by your doctor. Get help right away if you have signs of a serious reaction. This  information is not intended to replace advice given to you by your health care provider. Make sure you discuss any questions you have with your health care provider. Document Revised: 09/21/2020 Document Reviewed: 11/19/2018 Elsevier Patient Education  [employer].

## 2021-05-27 LAB — BPAM RBC
Blood Product Expiration Date: 202301092359
Blood Product Expiration Date: 202301092359
ISSUE DATE / TIME: 202212150733
ISSUE DATE / TIME: 202212150733
Unit Type and Rh: 6200
Unit Type and Rh: 6200

## 2021-05-27 LAB — TYPE AND SCREEN
ABO/RH(D): AB POS
Antibody Screen: NEGATIVE
Unit division: 0
Unit division: 0

## 2021-05-29 ENCOUNTER — Other Ambulatory Visit: Payer: Self-pay | Admitting: Oncology

## 2021-05-29 DIAGNOSIS — C250 Malignant neoplasm of head of pancreas: Secondary | ICD-10-CM

## 2021-06-03 ENCOUNTER — Other Ambulatory Visit: Payer: Self-pay | Admitting: Oncology

## 2021-06-05 ENCOUNTER — Ambulatory Visit (HOSPITAL_BASED_OUTPATIENT_CLINIC_OR_DEPARTMENT_OTHER)
Admission: RE | Admit: 2021-06-05 | Discharge: 2021-06-05 | Disposition: A | Discharge status: Home / Self Care | Payer: BC Managed Care – PPO | Source: Ambulatory Visit | Attending: Oncology | Admitting: Oncology

## 2021-06-05 ENCOUNTER — Other Ambulatory Visit: Payer: Self-pay

## 2021-06-05 DIAGNOSIS — R918 Other nonspecific abnormal finding of lung field: Secondary | ICD-10-CM | POA: Diagnosis not present

## 2021-06-05 DIAGNOSIS — C250 Malignant neoplasm of head of pancreas: Secondary | ICD-10-CM | POA: Insufficient documentation

## 2021-06-05 DIAGNOSIS — I700 Atherosclerosis of aorta: Secondary | ICD-10-CM | POA: Diagnosis not present

## 2021-06-05 DIAGNOSIS — R911 Solitary pulmonary nodule: Secondary | ICD-10-CM | POA: Diagnosis not present

## 2021-06-06 ENCOUNTER — Inpatient Hospital Stay: Payer: BC Managed Care – PPO

## 2021-06-06 ENCOUNTER — Ambulatory Visit: Payer: BC Managed Care – PPO

## 2021-06-06 ENCOUNTER — Other Ambulatory Visit: Payer: Self-pay

## 2021-06-06 ENCOUNTER — Inpatient Hospital Stay (HOSPITAL_BASED_OUTPATIENT_CLINIC_OR_DEPARTMENT_OTHER): Payer: BC Managed Care – PPO | Admitting: Oncology

## 2021-06-06 ENCOUNTER — Telehealth: Payer: Self-pay

## 2021-06-06 VITALS — BP 120/82 | HR 90 | Temp 98.1°F | Resp 18 | Ht 68.0 in | Wt 185.4 lb

## 2021-06-06 VITALS — BP 129/77 | HR 81 | Temp 98.5°F | Resp 18

## 2021-06-06 DIAGNOSIS — Z923 Personal history of irradiation: Secondary | ICD-10-CM | POA: Diagnosis not present

## 2021-06-06 DIAGNOSIS — C250 Malignant neoplasm of head of pancreas: Secondary | ICD-10-CM | POA: Diagnosis not present

## 2021-06-06 DIAGNOSIS — R918 Other nonspecific abnormal finding of lung field: Secondary | ICD-10-CM | POA: Diagnosis not present

## 2021-06-06 DIAGNOSIS — E1140 Type 2 diabetes mellitus with diabetic neuropathy, unspecified: Secondary | ICD-10-CM | POA: Diagnosis not present

## 2021-06-06 DIAGNOSIS — I10 Essential (primary) hypertension: Secondary | ICD-10-CM | POA: Diagnosis not present

## 2021-06-06 DIAGNOSIS — E876 Hypokalemia: Secondary | ICD-10-CM

## 2021-06-06 DIAGNOSIS — Z90411 Acquired partial absence of pancreas: Secondary | ICD-10-CM | POA: Diagnosis not present

## 2021-06-06 DIAGNOSIS — Z5111 Encounter for antineoplastic chemotherapy: Secondary | ICD-10-CM | POA: Diagnosis not present

## 2021-06-06 DIAGNOSIS — Z85528 Personal history of other malignant neoplasm of kidney: Secondary | ICD-10-CM | POA: Diagnosis not present

## 2021-06-06 DIAGNOSIS — N6321 Unspecified lump in the left breast, upper outer quadrant: Secondary | ICD-10-CM | POA: Diagnosis not present

## 2021-06-06 DIAGNOSIS — K760 Fatty (change of) liver, not elsewhere classified: Secondary | ICD-10-CM | POA: Diagnosis not present

## 2021-06-06 DIAGNOSIS — Z905 Acquired absence of kidney: Secondary | ICD-10-CM | POA: Diagnosis not present

## 2021-06-06 LAB — SAMPLE TO BLOOD BANK

## 2021-06-06 LAB — CMP (CANCER CENTER ONLY)
ALT: 84 U/L — ABNORMAL HIGH (ref 0–44)
AST: 73 U/L — ABNORMAL HIGH (ref 15–41)
Albumin: 2.8 g/dL — ABNORMAL LOW (ref 3.5–5.0)
Alkaline Phosphatase: 197 U/L — ABNORMAL HIGH (ref 38–126)
Anion gap: 7 (ref 5–15)
BUN: 8 mg/dL (ref 6–20)
CO2: 28 mmol/L (ref 22–32)
Calcium: 8.1 mg/dL — ABNORMAL LOW (ref 8.9–10.3)
Chloride: 104 mmol/L (ref 98–111)
Creatinine: 0.74 mg/dL (ref 0.44–1.00)
GFR, Estimated: >60 mL/min (ref >60)
Glucose, Bld: 159 mg/dL — ABNORMAL HIGH (ref 70–99)
Potassium: 3 mmol/L — ABNORMAL LOW (ref 3.5–5.1)
Sodium: 139 mmol/L (ref 135–145)
Total Bilirubin: 0.3 mg/dL (ref 0.3–1.2)
Total Protein: 5.6 g/dL — ABNORMAL LOW (ref 6.5–8.1)

## 2021-06-06 LAB — CBC WITH DIFFERENTIAL (CANCER CENTER ONLY)
Abs Immature Granulocytes: 0.01 10*3/uL (ref 0.00–0.07)
Basophils Absolute: 0 10*3/uL (ref 0.0–0.1)
Basophils Relative: 1 %
Eosinophils Absolute: 0.3 10*3/uL (ref 0.0–0.5)
Eosinophils Relative: 5 %
HCT: 27.4 % — ABNORMAL LOW (ref 36.0–46.0)
Hemoglobin: 8.5 g/dL — ABNORMAL LOW (ref 12.0–15.0)
Immature Granulocytes: 0 %
Lymphocytes Relative: 25 %
Lymphs Abs: 1.6 10*3/uL (ref 0.7–4.0)
MCH: 28.5 pg (ref 26.0–34.0)
MCHC: 31 g/dL (ref 30.0–36.0)
MCV: 91.9 fL (ref 80.0–100.0)
Monocytes Absolute: 0.8 10*3/uL (ref 0.1–1.0)
Monocytes Relative: 12 %
Neutro Abs: 3.6 10*3/uL (ref 1.7–7.7)
Neutrophils Relative %: 57 %
Platelet Count: 572 10*3/uL — ABNORMAL HIGH (ref 150–400)
RBC: 2.98 MIL/uL — ABNORMAL LOW (ref 3.87–5.11)
RDW: 14.4 % (ref 11.5–15.5)
WBC Count: 6.3 10*3/uL (ref 4.0–10.5)
nRBC: 0 % (ref 0.0–0.2)

## 2021-06-06 LAB — MAGNESIUM: Magnesium: 1.6 mg/dL — ABNORMAL LOW (ref 1.7–2.4)

## 2021-06-06 MED ORDER — SODIUM CHLORIDE 0.9 % IV SOLN
1000.0000 mg/m2 | Freq: Once | INTRAVENOUS | Status: AC
  Administered 2021-06-06: 13:00:00 2014 mg via INTRAVENOUS
  Filled 2021-06-06: qty 5.26

## 2021-06-06 MED ORDER — PROCHLORPERAZINE MALEATE 10 MG PO TABS
10.0000 mg | ORAL_TABLET | Freq: Once | ORAL | Status: AC
  Administered 2021-06-06: 10:00:00 10 mg via ORAL
  Filled 2021-06-06: qty 1

## 2021-06-06 MED ORDER — PACLITAXEL PROTEIN-BOUND CHEMO INJECTION 100 MG
100.0000 mg/m2 | Freq: Once | INTRAVENOUS | Status: AC
  Administered 2021-06-06: 13:00:00 200 mg via INTRAVENOUS
  Filled 2021-06-06: qty 40

## 2021-06-06 MED ORDER — HEPARIN SOD (PORK) LOCK FLUSH 100 UNIT/ML IV SOLN
500.0000 [IU] | Freq: Once | INTRAVENOUS | Status: AC | PRN
  Administered 2021-06-06: 14:00:00 500 [IU]

## 2021-06-06 MED ORDER — SODIUM CHLORIDE 0.9 % IV SOLN
Freq: Once | INTRAVENOUS | Status: AC
Start: 2021-06-06 — End: 2021-06-06

## 2021-06-06 MED ORDER — SODIUM CHLORIDE 0.9% FLUSH
10.0000 mL | INTRAVENOUS | Status: DC | PRN
  Administered 2021-06-06: 14:00:00 10 mL

## 2021-06-06 MED ORDER — POTASSIUM CHLORIDE 10 MEQ/100ML IV SOLN
10.0000 meq | INTRAVENOUS | Status: AC
  Administered 2021-06-06 (×2): 10 meq via INTRAVENOUS
  Filled 2021-06-06: qty 100

## 2021-06-06 MED ORDER — MAGNESIUM SULFATE 2 GM/50ML IV SOLN
2.0000 g | Freq: Once | INTRAVENOUS | Status: AC
  Administered 2021-06-06: 10:00:00 2 g via INTRAVENOUS
  Filled 2021-06-06: qty 50

## 2021-06-06 NOTE — Telephone Encounter (Signed)
Patient seen by Dr. Benay Spice today  Vitals are within treatment parameters.  Labs reviewed by Dr. Benay Spice and are not all within treatment parameters.   Magnesium 1.6 and potassium 3.0 Pt to receive magnesium and potassium IV  Per physician team, patient is ready for treatment and there are NO modifications to the treatment plan.

## 2021-06-06 NOTE — Progress Notes (Signed)
Patient presents for treatment. RN assessment completed along with the following:  Labs/vitals reviewed - Yes, LFTs and alkaline phosphatase continue to be elevated. Written hand off per Lenox Ponds LPN states we will continue with tx and no modifications to plan. K 3.0. Magnesium 1.6. Potassium 41meq IV x 2 runs to be administered today. Magnesium 2grams IV to be administered today. Weight within 10% of previous measurement - Yes Oncology Treatment Attestation completed for current therapy- Yes, on date 03/02/2021 Informed consent completed and reflects current therapy/intent - Yes, on date 03/28/2021             Provider progress note reviewed - Yes, provider note was reviewed. Treatment/Antibody/Supportive plan reviewed - Yes, and there are no adjustments needed for today's treatment. S&H and other orders reviewed - Yes, and there are no additional orders identified. Previous treatment date reviewed - Yes, and the appropriate amount of time has elapsed between treatments. Patient handed off to: Tedd Sias RN by Lupita Raider RN  Patient to proceed with treatment.

## 2021-06-06 NOTE — Patient Instructions (Signed)
Alyssa Ross   Discharge Instructions: Thank you for choosing [facility] to provide your oncology and hematology care.   If you have a lab appointment with the [facility], please go directly to the [facility] and check in at the registration area.   Wear comfortable clothing and clothing appropriate for easy access to any Portacath or PICC line.   We strive to give you quality time with your provider. You may need to reschedule your appointment if you arrive late (15 or more minutes).  Arriving late affects you and other patients whose appointments are after yours.  Also, if you miss three or more appointments without notifying the office, you may be dismissed from the clinic at the providers discretion.      For prescription refill requests, have your pharmacy contact our office and allow 72 hours for refills to be completed.    Today you received the following chemotherapy and/or immunotherapy agents abraxane, gemzar, magnesium and potassium      To help prevent nausea and vomiting after your treatment, we encourage you to take your nausea medication as directed.  BELOW ARE SYMPTOMS THAT SHOULD BE REPORTED IMMEDIATELY: *FEVER GREATER THAN 100.4 F (38 C) OR HIGHER *CHILLS OR SWEATING *NAUSEA AND VOMITING THAT IS NOT CONTROLLED WITH YOUR NAUSEA MEDICATION *UNUSUAL SHORTNESS OF BREATH *UNUSUAL BRUISING OR BLEEDING *URINARY PROBLEMS (pain or burning when urinating, or frequent urination) *BOWEL PROBLEMS (unusual diarrhea, constipation, pain near the anus) TENDERNESS IN MOUTH AND THROAT WITH OR WITHOUT PRESENCE OF ULCERS (sore throat, sores in mouth, or a toothache) UNUSUAL RASH, SWELLING OR PAIN  UNUSUAL VAGINAL DISCHARGE OR ITCHING   Items with * indicate a potential emergency and should be followed up as soon as possible or go to the Emergency Department if any problems should occur.  Please show the CHEMOTHERAPY ALERT CARD or  IMMUNOTHERAPY ALERT CARD at check-in to the Emergency Department and triage nurse.  Should you have questions after your visit or need to cancel or reschedule your appointment, please contact [facility]  Dept: [phone]  and follow the prompts.  Office hours are 8:00 a.m. to 4:30 p.m. Monday - Friday. Please note that voicemails left after 4:00 p.m. may not be returned until the following business day.  We are closed weekends and major holidays. You have access to a nurse at all times for urgent questions. Please call the main number to the clinic Dept: [phone] and follow the prompts.   For any non-urgent questions, you may also contact your provider using MyChart. We now offer e-Visits for anyone 6 and older to request care online for non-urgent symptoms. For details visit [email or web address].   Also download the MyChart app! Go to the app store, search "MyChart", open the app, select [facility], and log in with your MyChart username and password.  Due to Covid, a mask is required upon entering the hospital/clinic. If you do not have a mask, one will be given to you upon arrival. For doctor visits, patients may have 1 support person aged 36 or older with them. For treatment visits, patients cannot have anyone with them due to current Covid guidelines and our immunocompromised population.   Nanoparticle Albumin-Bound Paclitaxel injection What is this medication? NANOPARTICLE ALBUMIN-BOUND PACLITAXEL (Na no PAHR ti kuhl  al BYOO muhn-bound  PAK li TAX el) is a chemotherapy drug. It targets fast dividing cells, like cancer cells, and causes these cells to die. This  medicine is used to treat advanced breast cancer, lung cancer, and pancreatic cancer. This medicine may be used for other purposes; ask your health care provider or pharmacist if you have questions. COMMON BRAND NAME(S): Abraxane What should I tell my care team before I take this medication? They  need to know if you have any of these conditions: kidney disease liver disease low blood counts, like low white cell, platelet, or red cell counts lung or breathing disease, like asthma tingling of the fingers or toes, or other nerve disorder an unusual or allergic reaction to paclitaxel, albumin, other chemotherapy, other medicines, foods, dyes, or preservatives pregnant or trying to get pregnant breast-feeding How should I use this medication? This drug is given as an infusion into a vein. It is administered in a hospital or clinic by a specially trained health care professional. Talk to your pediatrician regarding the use of this medicine in children. Special care may be needed. Overdosage: If you think you have taken too much of this medicine contact a poison control center or emergency room at once. NOTE: This medicine is only for you. Do not share this medicine with others. What if I miss a dose? It is important not to miss your dose. Call your doctor or health care professional if you are unable to keep an appointment. What may interact with this medication? This medicine may interact with the following medications: antiviral medicines for hepatitis, HIV or AIDS certain antibiotics like erythromycin and clarithromycin certain medicines for fungal infections like ketoconazole and itraconazole certain medicines for seizures like carbamazepine, phenobarbital, phenytoin gemfibrozil nefazodone rifampin St. John's wort This list may not describe all possible interactions. Give your health care provider a list of all the medicines, herbs, non-prescription drugs, or dietary supplements you use. Also tell them if you smoke, drink alcohol, or use illegal drugs. Some items may interact with your medicine. What should I watch for while using this medication? Your condition will be monitored carefully while you are receiving this medicine. You will need important blood work done while you are  taking this medicine. This medicine can cause serious allergic reactions. If you experience allergic reactions like skin rash, itching or hives, swelling of the face, lips, or tongue, tell your doctor or health care professional right away. In some cases, you may be given additional medicines to help with side effects. Follow all directions for their use. This drug may make you feel generally unwell. This is not uncommon, as chemotherapy can affect healthy cells as well as cancer cells. Report any side effects. Continue your course of treatment even though you feel ill unless your doctor tells you to stop. Call your doctor or health care professional for advice if you get a fever, chills or sore throat, or other symptoms of a cold or flu. Do not treat yourself. This drug decreases your body's ability to fight infections. Try to avoid being around people who are sick. This medicine may increase your risk to bruise or bleed. Call your doctor or health care professional if you notice any unusual bleeding. Be careful brushing and flossing your teeth or using a toothpick because you may get an infection or bleed more easily. If you have any dental work done, tell your dentist you are receiving this medicine. Avoid taking products that contain aspirin, acetaminophen, ibuprofen, naproxen, or ketoprofen unless instructed by your doctor. These medicines may hide a fever. Do not become pregnant while taking this medicine or for 6 months after stopping  it. Women should inform their doctor if they wish to become pregnant or think they might be pregnant. Men should not father a child while taking this medicine or for 3 months after stopping it. There is a potential for serious side effects to an unborn child. Talk to your health care professional or pharmacist for more information. Do not breast-feed an infant while taking this medicine or for 2 weeks after stopping it. This medicine may interfere with the ability to  get pregnant or to father a child. You should talk to your doctor or health care professional if you are concerned about your fertility. What side effects may I notice from receiving this medication? Side effects that you should report to your doctor or health care professional as soon as possible: allergic reactions like skin rash, itching or hives, swelling of the face, lips, or tongue breathing problems changes in vision fast, irregular heartbeat low blood pressure mouth sores pain, tingling, numbness in the hands or feet signs of decreased platelets or bleeding - bruising, pinpoint red spots on the skin, black, tarry stools, blood in the urine signs of decreased red blood cells - unusually weak or tired, feeling faint or lightheaded, falls signs of infection - fever or chills, cough, sore throat, pain or difficulty passing urine signs and symptoms of liver injury like dark yellow or brown urine; general ill feeling or flu-like symptoms; light-colored stools; loss of appetite; nausea; right upper belly pain; unusually weak or tired; yellowing of the eyes or skin swelling of the ankles, feet, hands unusually slow heartbeat Side effects that usually do not require medical attention (report to your doctor or health care professional if they continue or are bothersome): diarrhea hair loss loss of appetite nausea, vomiting tiredness This list may not describe all possible side effects. Call your doctor for medical advice about side effects. You may report side effects to FDA at 1-800-FDA-1088. Where should I keep my medication? This drug is given in a hospital or clinic and will not be stored at home. NOTE: This sheet is a summary. It may not cover all possible information. If you have questions about this medicine, talk to your doctor, pharmacist, or health care provider.  2022 Elsevier/Gold Standard (2017-01-28 00:00:00)  Gemcitabine injection What is this medication? GEMCITABINE (jem  SYE ta been) is a chemotherapy drug. This medicine is used to treat many types of cancer like breast cancer, lung cancer, pancreatic cancer, and ovarian cancer. This medicine may be used for other purposes; ask your health care provider or pharmacist if you have questions. COMMON BRAND NAME(S): Gemzar, Infugem What should I tell my care team before I take this medication? They need to know if you have any of these conditions: blood disorders infection kidney disease liver disease lung or breathing disease, like asthma recent or ongoing radiation therapy an unusual or allergic reaction to gemcitabine, other chemotherapy, other medicines, foods, dyes, or preservatives pregnant or trying to get pregnant breast-feeding How should I use this medication? This drug is given as an infusion into a vein. It is administered in a hospital or clinic by a specially trained health care professional. Talk to your pediatrician regarding the use of this medicine in children. Special care may be needed. Overdosage: If you think you have taken too much of this medicine contact a poison control center or emergency room at once. NOTE: This medicine is only for you. Do not share this medicine with others. What if I miss a dose? It  is important not to miss your dose. Call your doctor or health care professional if you are unable to keep an appointment. What may interact with this medication? medicines to increase blood counts like filgrastim, pegfilgrastim, sargramostim some other chemotherapy drugs like cisplatin vaccines Talk to your doctor or health care professional before taking any of these medicines: acetaminophen aspirin ibuprofen ketoprofen naproxen This list may not describe all possible interactions. Give your health care provider a list of all the medicines, herbs, non-prescription drugs, or dietary supplements you use. Also tell them if you smoke, drink alcohol, or use illegal drugs. Some items  may interact with your medicine. What should I watch for while using this medication? Visit your doctor for checks on your progress. This drug may make you feel generally unwell. This is not uncommon, as chemotherapy can affect healthy cells as well as cancer cells. Report any side effects. Continue your course of treatment even though you feel ill unless your doctor tells you to stop. In some cases, you may be given additional medicines to help with side effects. Follow all directions for their use. Call your doctor or health care professional for advice if you get a fever, chills or sore throat, or other symptoms of a cold or flu. Do not treat yourself. This drug decreases your body's ability to fight infections. Try to avoid being around people who are sick. This medicine may increase your risk to bruise or bleed. Call your doctor or health care professional if you notice any unusual bleeding. Be careful brushing and flossing your teeth or using a toothpick because you may get an infection or bleed more easily. If you have any dental work done, tell your dentist you are receiving this medicine. Avoid taking products that contain aspirin, acetaminophen, ibuprofen, naproxen, or ketoprofen unless instructed by your doctor. These medicines may hide a fever. Do not become pregnant while taking this medicine or for 6 months after stopping it. Women should inform their doctor if they wish to become pregnant or think they might be pregnant. Men should not father a child while taking this medicine and for 3 months after stopping it. There is a potential for serious side effects to an unborn child. Talk to your health care professional or pharmacist for more information. Do not breast-feed an infant while taking this medicine or for at least 1 week after stopping it. Men should inform their doctors if they wish to father a child. This medicine may lower sperm counts. Talk with your doctor or health care  professional if you are concerned about your fertility. What side effects may I notice from receiving this medication? Side effects that you should report to your doctor or health care professional as soon as possible: allergic reactions like skin rash, itching or hives, swelling of the face, lips, or tongue breathing problems pain, redness, or irritation at site where injected signs and symptoms of a dangerous change in heartbeat or heart rhythm like chest pain; dizziness; fast or irregular heartbeat; palpitations; feeling faint or lightheaded, falls; breathing problems signs of decreased platelets or bleeding - bruising, pinpoint red spots on the skin, black, tarry stools, blood in the urine signs of decreased red blood cells - unusually weak or tired, feeling faint or lightheaded, falls signs of infection - fever or chills, cough, sore throat, pain or difficulty passing urine signs and symptoms of kidney injury like trouble passing urine or change in the amount of urine signs and symptoms of liver injury like  dark yellow or brown urine; general ill feeling or flu-like symptoms; light-colored stools; loss of appetite; nausea; right upper belly pain; unusually weak or tired; yellowing of the eyes or skin swelling of ankles, feet, hands Side effects that usually do not require medical attention (report to your doctor or health care professional if they continue or are bothersome): constipation diarrhea hair loss loss of appetite nausea rash vomiting This list may not describe all possible side effects. Call your doctor for medical advice about side effects. You may report side effects to FDA at 1-800-FDA-1088. Where should I keep my medication? This drug is given in a hospital or clinic and will not be stored at home. NOTE: This sheet is a summary. It may not cover all possible information. If you have questions about this medicine, talk to your doctor, pharmacist, or health care provider.   2022 Elsevier/Gold Standard (2017-08-20 00:00:00)  Hypomagnesemia Hypomagnesemia is a condition in which the level of magnesium in the blood is too low. Magnesium is a mineral that is found in many foods. It is used in many different processes in the body. Hypomagnesemia can affect every organ in the body. In severe cases, it can cause life-threatening problems. What are the causes? This condition may be caused by: Not getting enough magnesium in your diet or not having enough healthy foods to eat (malnutrition). Problems with magnesium absorption in the intestines. Dehydration. Excessive use of alcohol. Vomiting. Severe or long-term (chronic) diarrhea. Some medicines, including medicines that make you urinate more often (diuretics). Certain diseases, such as kidney disease, diabetes, celiac disease, and overactive thyroid. What are the signs or symptoms? Symptoms of this condition include: Loss of appetite, nausea, and vomiting. Involuntary shaking or trembling of a body part (tremor). Muscle weakness or tingling in the arms and legs. Sudden tightening of muscles (muscle spasms). Confusion. Psychiatric issues, such as: Depression and irritability. Psychosis. A feeling of fluttering of the heart (palpitations). Seizures. These symptoms are more severe if magnesium levels drop suddenly. How is this diagnosed? This condition may be diagnosed based on: Your symptoms and medical history. A physical exam. Blood and urine tests. How is this treated? Treatment depends on the cause and the severity of the condition. It may be treated by: Taking a magnesium supplement. This can be taken in pill form. If the condition is severe, magnesium is usually given through an IV. Making changes to your diet. You may be directed to eat foods that have a lot of magnesium, such as green leafy vegetables, peas, beans, and nuts. Not drinking alcohol. If you are struggling not to drink, ask your health  care provider for help. Follow these instructions at home: Eating and drinking   Make sure that your diet includes foods with magnesium. Foods that have a lot of magnesium in them include: Green leafy vegetables, such as spinach and broccoli. Beans and peas. Nuts and seeds, such as almonds and sunflower seeds. Whole grains, such as whole grain bread and fortified cereals. Drink fluids that contain salts and minerals (electrolytes), such as sports drinks, when you are active. Do not drink alcohol. General instructions Take over-the-counter and prescription medicines only as told by your health care provider. Take magnesium supplements as directed if your health care provider tells you to take them. Have your magnesium levels monitored as told by your health care provider. Keep all follow-up visits. This is important. Contact a health care provider if: You get worse instead of better. Your symptoms return. Get help  right away if: You develop severe muscle weakness. You have trouble breathing. You feel that your heart is racing. These symptoms may represent a serious problem that is an emergency. Do not wait to see if the symptoms will go away. Get medical help right away. Call your local emergency services (911 in the [place] Do not drive yourself to the hospital. Summary Hypomagnesemia is a condition in which the level of magnesium in the blood is too low. Hypomagnesemia can affect every organ in the body. Treatment may include eating more foods that contain magnesium, taking magnesium supplements, and not drinking alcohol. Have your magnesium levels monitored as told by your health care provider. This information is not intended to replace advice given to you by your health care provider. Make sure you discuss any questions you have with your health care provider. Document Revised: 10/24/2020 Document Reviewed: 10/24/2020 Elsevier Patient Education  [place].  Hypokalemia Hypokalemia means that the amount of potassium in the blood is lower than normal. Potassium is a chemical (electrolyte) that helps regulate the amount of fluid in the body. It also stimulates muscle tightening (contraction) and helps nerves work properly. Normally, most of the body's potassium is inside cells, and only a very small amount is in the blood. Because the amount in the blood is so small, minor changes to potassium levels in the blood can be life-threatening. What are the causes? This condition may be caused by: Antibiotic medicine. Diarrhea or vomiting. Taking too much of a medicine that helps you have a bowel movement (laxative) can cause diarrhea and lead to hypokalemia. Chronic kidney disease (CKD). Medicines that help the body get rid of excess fluid (diuretics). Eating disorders, such as bulimia. Low magnesium levels in the body. Sweating a lot. What are the signs or symptoms? Symptoms of this condition include: Weakness. Constipation. Fatigue. Muscle cramps. Mental confusion. Skipped heartbeats or irregular heartbeat (palpitations). Tingling or numbness. How is this diagnosed? This condition is diagnosed with a blood test. How is this treated? This condition may be treated by: Taking potassium supplements by mouth. Adjusting the medicines that you take. Eating more foods that contain a lot of potassium. If your potassium level is very low, you may need to get potassium through an IV and be monitored in the hospital. Follow these instructions at home:  Take over-the-counter and prescription medicines only as told by your health care provider. This includes vitamins and supplements. Eat a healthy diet. A healthy diet includes fresh fruits and vegetables, whole grains, healthy fats, and lean proteins. If instructed, eat more foods that contain a lot of potassium. This includes: Nuts, such as peanuts and pistachios. Seeds, such as sunflower seeds  and pumpkin seeds. Peas, lentils, and lima beans. Whole grain and bran cereals and breads. Fresh fruits and vegetables, such as apricots, avocado, bananas, cantaloupe, kiwi, oranges, tomatoes, asparagus, and potatoes. Orange juice. Tomato juice. Red meats. Yogurt. Keep all follow-up visits as told by your health care provider. This is important. Contact a health care provider if you: Have weakness that gets worse. Feel your heart pounding or racing. Vomit. Have diarrhea. Have diabetes (diabetes mellitus) and you have trouble keeping your blood sugar (glucose) in your target range. Get help right away if you: Have chest pain. Have shortness of breath. Have vomiting or diarrhea that lasts for more than 2 days. Faint. Summary Hypokalemia means that the amount of potassium in the blood is lower than normal. This condition is diagnosed with a blood  test. Hypokalemia may be treated by taking potassium supplements, adjusting the medicines that you take, or eating more foods that are high in potassium. If your potassium level is very low, you may need to get potassium through an IV and be monitored in the hospital. This information is not intended to replace advice given to you by your health care provider. Make sure you discuss any questions you have with your health care provider. Document Revised: 01/06/2018 Document Reviewed: 01/07/2018 Elsevier Patient Education  [place].

## 2021-06-06 NOTE — Progress Notes (Signed)
[place] OFFICE PROGRESS NOTE   Diagnosis: Pancreas cancer  INTERVAL HISTORY:   Alyssa Ross returns as scheduled.  She completed another treatment with gemcitabine and Abraxane on 05/23/2021.  No fever.  She has noted a rash over the face.  No change in neuropathy symptoms that predated the current chemotherapy.  She has malaise following chemotherapy.  The malaise improved partially when she received a Red cell transfusion 05/24/2021.  She continues working.  Objective:  Vital signs in last 24 hours:  Blood pressure 120/82, pulse 90, temperature 98.1 F (36.7 C), temperature source Oral, resp. rate 18, height _0  (1.727 m), weight 185 lb 6.4 oz (84.1 kg), last menstrual period 09/17/2012, SpO2 100 %.    HEENT: No thrush or ulcers Resp: Lungs clear bilaterally Cardio: Regular rate and rhythm GI: Nontender, no mass, no hepatosplenomegaly Vascular: No leg edema  Skin: Mild hyperpigmentation of the hands, hyperpigmented moles over the face, few acne type lesions over the face  Portacath/PICC-without erythema  Lab Results:  Lab Results  Component Value Date   WBC 6.3 06/06/2021   HGB 8.5 (L) 06/06/2021   HCT 27.4 (L) 06/06/2021   MCV 91.9 06/06/2021   PLT 572 (H) 06/06/2021   NEUTROABS 3.6 06/06/2021    CMP  Lab Results  Component Value Date   NA 139 06/06/2021   K 3.0 (L) 06/06/2021   CL 104 06/06/2021   CO2 28 06/06/2021   GLUCOSE 159 (H) 06/06/2021   BUN 8 06/06/2021   CREATININE 0.74 06/06/2021   CALCIUM 8.1 (L) 06/06/2021   PROT 5.6 (L) 06/06/2021   ALBUMIN 2.8 (L) 06/06/2021   AST 73 (H) 06/06/2021   ALT 84 (H) 06/06/2021   ALKPHOS 197 (H) 06/06/2021   BILITOT 0.3 06/06/2021   GFRNONAA >60 06/06/2021   GFRAA >60 02/07/2020    Lab Results  Component Value Date   CAN199 45 (H) 05/23/2021     CT CHEST WO CONTRAST  Result Date: 06/06/2021 CLINICAL DATA:  Restaging pancreatic cancer. Follow-up pulmonary nodules. Pancreatic cancer  diagnosed in 2020. Chemotherapy ongoing. EXAM: CT CHEST WITHOUT CONTRAST TECHNIQUE: Multidetector CT imaging of the chest was performed following the standard protocol without IV contrast. COMPARISON:  CT chest 03/21/2021 and 01/09/2021 FINDINGS: Cardiovascular: Left subclavian Port-A-Cath extends to the mid SVC. There is mild aortic atherosclerosis. No acute vascular findings on noncontrast imaging. The heart size is normal. There is no pericardial effusion. Mediastinum/Nodes: There are no enlarged mediastinal, hilar or axillary lymph nodes.Hilar assessment is limited by the lack of intravenous contrast, although the hilar contours appear unchanged. The thyroid gland, trachea and esophagus demonstrate no significant findings. Lungs/Pleura: No pleural effusion or pneumothorax. Innumerable cavitary pulmonary nodules are again noted bilaterally. Most of these are unchanged in size compared with the prior study, although there is increased cavitation of several nodules, and a few are slightly smaller. For example, a previously demonstrated left apical nodule which measured 11 mm on the prior study shows increased cavitation and now measures 7 mm on image 15/4. Index right apical nodule shows increased cavitation measures 7 mm on image 32/4 (previously 8 mm). The largest nodules have not significantly changed, including a 1.9 x 1.5 cm right upper lobe nodule on image 60/4 and a 2.1 x 1.7 cm left lower lobe nodule on image 55/4. No enlarging nodules identified. Upper abdomen: Hepatic steatosis. There are postsurgical changes related to prior Whipple procedure with a pancreatic stent in place. Pneumobilia has decreased compared with the  previous study. No significant biliary dilatation identified. 1.7 cm right adrenal nodule appears unchanged. Musculoskeletal/Chest wall: There is no chest wall mass or suspicious osseous finding. IMPRESSION: 1. Interval cavitation of multiple previously demonstrated pulmonary nodules. Some  of the nodules have decreased in size, and no new or enlarging nodules are identified. 2. No thoracic adenopathy, pleural or pericardial effusion. 3. No acute findings identified within the visualized upper abdomen with postsurgical changes and chronic findings as described. Electronically Signed   By: Richardean Sale M.D.   On: 06/06/2021 09:04    Medications: I have reviewed the patient's current medications.   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 Tumor invades the duodenal wall and vascular groove, resection margins negative, 4/34 lymph nodes positive MSI-stable, tumor showed instability in 2 loci as did adjacent normal tissue EUS FNA biopsy of pancreas mass on 07/03/2018-well-differentiated neuroendocrine tumor CTs 01/29/2019-ill-defined pancreas head mass, 5 pulmonary nodules-1 with a small amount of central cavitation, tumor abuts the left margin of the portal vein indistinct density surrounding, hepatic artery, complex cystic lesion of the right kidney, right adrenal mass-characterized as an adenoma on a [facility] MRI 12/21/2018 [facility]spot 03/03/2019-no focal pancreas activity, no tracer accumulation within the suspicious pulmonary nodules, left uterine mass with tracer accumulation felt to represent a leiomyoma Elevated preoperative CA 19-9--CA 19-9 186 on 01/18/2019 CT chest 04/16/2019-multiple bilateral pulmonary nodules, some with increased cavitation, stable in size Cycle 1 FOLFIRINOX 04/27/2019 Cycle 2 FOLFIRINOX 05/11/2019 Cycle 3 FOLFIRINOX 05/23/2019 Cycle 4 FOLFIRINOX 06/08/2019 Cycle 5 FOLFIRINOX 06/22/2019 CT chest 07/02/2019-stable size of bilateral pulmonary nodules.  Dominant cavitary lesions in both lungs show increased cavitation with thinner walls.  Stable 2.1 cm right adrenal nodule. Cycle 6 FOLFIRINOX 07/06/2019 Cycle 7 FOLFIRINOX 07/21/2019 Cycle 8 FOLFIRINOX 08/03/2019, oxaliplatin deleted secondary to neuropathy CT  chest 08/24/2019-decreased size of several lung nodules with resolution of a left upper lobe nodule, no new nodules Radiation to the pancreas surgical area with concurrent Xeloda 09/13/2019-10/20/2019  CTs 11/29/2019-multiple small pulmonary nodules scattered throughout the lungs bilaterally, appear increased in number and size. No definite evidence of metastatic disease in the abdomen or pelvis. Markedly enlarged and heterogeneous appearing uterus, likely to represent multifocal fibroids. 1 of these lesions appears to be an exophytic subserosal fibroid in the posterior lateral aspect of the uterine body on the left side although this comes in close proximity to the left adnexa such that a primary ovarian lesion is difficult to completely exclude. CTs 02/07/2020-slight enlargement of bilateral lung nodules, some are cavitary, no evidence of metastatic disease in the abdomen or pelvis, stable right adrenal nodule, uterine fibroids CTs 04/26/2020-mild enlargement of pulmonary nodules, slight increase in trace pelvic fluid, new soft tissue thickening inferior to the cecal tip suspicious for peritoneal metastasis CT 05/26/2020-improved appearance of soft tissue at the inferior tip of the cecum, mildly thickened short appendix-findings suggestive of resolving appendicitis, stable small bibasilar pulmonary nodules, fibroids Plan biopsy of right cecal tip soft tissue canceled secondary to radiologic improvement CT chest 08/02/2020-enlargement and progressive cavitation of multiple bilateral lung nodules.  Some new nodules are present. CTs 10/24/2020- increase in size of pulmonary nodules, no new nodules, no evidence of metastatic disease in the abdomen, stable right adrenal nodule CT 01/09/2021-slight interval enlargement of pulmonary nodules, stable right adrenal nodule Navigation bronchoscopy 01/30/2021-left lower lobe cavitary nodule FNA-adenocarcinoma, brushing-adenocarcinoma.  Left lower lobe lavage-adenocarcinoma.   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 Sprowl appears stable.  She continues to tolerate the gemcitabine and Abraxane well.  The restaging chest CT is consistent with a response to treatment.  The CA 19-9 is lower.  The plan is to continue gemcitabine/Abraxane.  The hemoglobin is higher compared to when she was here 2 weeks ago.  We will arrange for a Red cell transfusion if the hemoglobin is lower when she is here in 2 weeks.  She will return for an office visit and chemotherapy in 2 weeks.  She will receive intravenous potassium and magnesium supplementation today.  [name], MD  06/06/2021  9:37 AM

## 2021-06-07 ENCOUNTER — Encounter: Payer: Self-pay | Admitting: *Deleted

## 2021-06-07 LAB — CANCER ANTIGEN 19-9: CA 19-9: 37 U/mL — ABNORMAL HIGH (ref 0–35)

## 2021-06-07 NOTE — Progress Notes (Signed)
With patient's consent, faxed last office note, labs and CT chest w/date of next appointment to case manager with [facility] at [phone].

## 2021-06-15 DIAGNOSIS — Z794 Long term (current) use of insulin: Secondary | ICD-10-CM | POA: Diagnosis not present

## 2021-06-15 DIAGNOSIS — C259 Malignant neoplasm of pancreas, unspecified: Secondary | ICD-10-CM | POA: Diagnosis not present

## 2021-06-15 DIAGNOSIS — E119 Type 2 diabetes mellitus without complications: Secondary | ICD-10-CM | POA: Diagnosis not present

## 2021-06-15 DIAGNOSIS — Z7984 Long term (current) use of oral hypoglycemic drugs: Secondary | ICD-10-CM | POA: Diagnosis not present

## 2021-06-15 DIAGNOSIS — Z9889 Other specified postprocedural states: Secondary | ICD-10-CM | POA: Diagnosis not present

## 2021-06-15 DIAGNOSIS — Z90410 Acquired total absence of pancreas: Secondary | ICD-10-CM | POA: Diagnosis not present

## 2021-06-16 ENCOUNTER — Other Ambulatory Visit: Payer: Self-pay | Admitting: Oncology

## 2021-06-20 ENCOUNTER — Encounter: Payer: Self-pay | Admitting: Oncology

## 2021-06-20 ENCOUNTER — Inpatient Hospital Stay: Payer: BC Managed Care – PPO

## 2021-06-20 ENCOUNTER — Inpatient Hospital Stay: Payer: BC Managed Care – PPO | Attending: Genetic Counselor | Admitting: Oncology

## 2021-06-20 ENCOUNTER — Other Ambulatory Visit: Payer: Self-pay

## 2021-06-20 VITALS — BP 122/79 | HR 94 | Temp 98.1°F | Resp 18 | Ht 68.0 in | Wt 182.6 lb

## 2021-06-20 DIAGNOSIS — Z905 Acquired absence of kidney: Secondary | ICD-10-CM | POA: Diagnosis not present

## 2021-06-20 DIAGNOSIS — Z923 Personal history of irradiation: Secondary | ICD-10-CM | POA: Diagnosis not present

## 2021-06-20 DIAGNOSIS — R918 Other nonspecific abnormal finding of lung field: Secondary | ICD-10-CM | POA: Insufficient documentation

## 2021-06-20 DIAGNOSIS — C250 Malignant neoplasm of head of pancreas: Secondary | ICD-10-CM | POA: Diagnosis not present

## 2021-06-20 DIAGNOSIS — Z90411 Acquired partial absence of pancreas: Secondary | ICD-10-CM | POA: Diagnosis not present

## 2021-06-20 DIAGNOSIS — Z5111 Encounter for antineoplastic chemotherapy: Secondary | ICD-10-CM | POA: Insufficient documentation

## 2021-06-20 DIAGNOSIS — Z85528 Personal history of other malignant neoplasm of kidney: Secondary | ICD-10-CM | POA: Diagnosis not present

## 2021-06-20 DIAGNOSIS — E876 Hypokalemia: Secondary | ICD-10-CM

## 2021-06-20 DIAGNOSIS — Z79899 Other long term (current) drug therapy: Secondary | ICD-10-CM | POA: Insufficient documentation

## 2021-06-20 LAB — CBC WITH DIFFERENTIAL (CANCER CENTER ONLY)
Abs Immature Granulocytes: 0.02 10*3/uL (ref 0.00–0.07)
Basophils Absolute: 0 10*3/uL (ref 0.0–0.1)
Basophils Relative: 1 %
Eosinophils Absolute: 0.3 10*3/uL (ref 0.0–0.5)
Eosinophils Relative: 4 %
HCT: 27.4 % — ABNORMAL LOW (ref 36.0–46.0)
Hemoglobin: 8.5 g/dL — ABNORMAL LOW (ref 12.0–15.0)
Immature Granulocytes: 0 %
Lymphocytes Relative: 25 %
Lymphs Abs: 1.5 10*3/uL (ref 0.7–4.0)
MCH: 28.2 pg (ref 26.0–34.0)
MCHC: 31 g/dL (ref 30.0–36.0)
MCV: 91 fL (ref 80.0–100.0)
Monocytes Absolute: 0.7 10*3/uL (ref 0.1–1.0)
Monocytes Relative: 12 %
Neutro Abs: 3.4 10*3/uL (ref 1.7–7.7)
Neutrophils Relative %: 58 %
Platelet Count: 576 10*3/uL — ABNORMAL HIGH (ref 150–400)
RBC: 3.01 MIL/uL — ABNORMAL LOW (ref 3.87–5.11)
RDW: 15.5 % (ref 11.5–15.5)
WBC Count: 6 10*3/uL (ref 4.0–10.5)
nRBC: 0 % (ref 0.0–0.2)

## 2021-06-20 LAB — CMP (CANCER CENTER ONLY)
ALT: 28 U/L (ref 0–44)
AST: 20 U/L (ref 15–41)
Albumin: 2.9 g/dL — ABNORMAL LOW (ref 3.5–5.0)
Alkaline Phosphatase: 158 U/L — ABNORMAL HIGH (ref 38–126)
Anion gap: 5 (ref 5–15)
BUN: 6 mg/dL (ref 6–20)
CO2: 28 mmol/L (ref 22–32)
Calcium: 8.4 mg/dL — ABNORMAL LOW (ref 8.9–10.3)
Chloride: 106 mmol/L (ref 98–111)
Creatinine: 0.85 mg/dL (ref 0.44–1.00)
GFR, Estimated: >60 mL/min (ref >60)
Glucose, Bld: 142 mg/dL — ABNORMAL HIGH (ref 70–99)
Potassium: 3 mmol/L — ABNORMAL LOW (ref 3.5–5.1)
Sodium: 139 mmol/L (ref 135–145)
Total Bilirubin: 0.4 mg/dL (ref 0.3–1.2)
Total Protein: 6.2 g/dL — ABNORMAL LOW (ref 6.5–8.1)

## 2021-06-20 LAB — SAMPLE TO BLOOD BANK

## 2021-06-20 LAB — MAGNESIUM: Magnesium: 1.8 mg/dL (ref 1.7–2.4)

## 2021-06-20 NOTE — Progress Notes (Signed)
[place] OFFICE PROGRESS NOTE   Diagnosis: Pancreas cancer  INTERVAL HISTORY:   Ms. Englander completed another cycle of gemcitabine/Abraxane on 06/06/2021.  No fever, rash, or new neuropathy symptoms.  She has malaise following chemotherapy.  No dyspnea.  She has noted bilateral lower leg swelling in the evening.  No erythema or pain.  Objective:  Vital signs in last 24 hours:  Blood pressure 122/79, pulse 94, temperature 98.1 F (36.7 C), temperature source Oral, resp. rate 18, height 5' 8"  (1.727 m), weight 182 lb 9.6 oz (82.8 kg), last menstrual period 09/17/2012, SpO2 99 %.    HEENT: No thrush or ulcers Resp: Lungs clear bilaterally Cardio: Regular rate and rhythm GI: No mass, nontender, no hepatosplenomegaly Vascular: Trace pitting edema at the pretibial region and ankle bilaterally  Skin: Mild hyperpigmentation of the hands  Portacath/PICC-without erythema  Lab Results:  Lab Results  Component Value Date   WBC 6.0 06/20/2021   HGB 8.5 (L) 06/20/2021   HCT 27.4 (L) 06/20/2021   MCV 91.0 06/20/2021   PLT 576 (H) 06/20/2021   NEUTROABS 3.4 06/20/2021    CMP  Lab Results  Component Value Date   NA 139 06/20/2021   K 3.0 (L) 06/20/2021   CL 106 06/20/2021   CO2 28 06/20/2021   GLUCOSE 142 (H) 06/20/2021   BUN 6 06/20/2021   CREATININE 0.85 06/20/2021   CALCIUM 8.4 (L) 06/20/2021   PROT 6.2 (L) 06/20/2021   ALBUMIN 2.9 (L) 06/20/2021   AST 20 06/20/2021   ALT 28 06/20/2021   ALKPHOS 158 (H) 06/20/2021   BILITOT 0.4 06/20/2021   GFRNONAA >60 06/20/2021   GFRAA >60 02/07/2020    Lab Results  Component Value Date   IRC789 37 (H) 06/06/2021   Medications: I have reviewed the patient's current medications.   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 Tumor invades the duodenal wall and vascular groove, resection margins negative, 4/34 lymph nodes positive MSI-stable, tumor  showed instability in 2 loci as did adjacent normal tissue EUS FNA biopsy of pancreas mass on 07/03/2018-well-differentiated neuroendocrine tumor CTs 01/29/2019-ill-defined pancreas head mass, 5 pulmonary nodules-1 with a small amount of central cavitation, tumor abuts the left margin of the portal vein indistinct density surrounding, hepatic artery, complex cystic lesion of the right kidney, right adrenal mass-characterized as an adenoma on a [facility] MRI 12/21/2018 [facility] 03/03/2019-no focal pancreas activity, no tracer accumulation within the suspicious pulmonary nodules, left uterine mass with tracer accumulation felt to represent a leiomyoma Elevated preoperative CA 19-9--CA 19-9 186 on 01/18/2019 CT chest 04/16/2019-multiple bilateral pulmonary nodules, some with increased cavitation, stable in size Cycle 1 FOLFIRINOX 04/27/2019 Cycle 2 FOLFIRINOX 05/11/2019 Cycle 3 FOLFIRINOX 05/23/2019 Cycle 4 FOLFIRINOX 06/08/2019 Cycle 5 FOLFIRINOX 06/22/2019 CT chest 07/02/2019-stable size of bilateral pulmonary nodules.  Dominant cavitary lesions in both lungs show increased cavitation with thinner walls.  Stable 2.1 cm right adrenal nodule. 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 No definite evidence of metastatic disease in the abdomen or pelvis. Markedly enlarged and heterogeneous appearing uterus, likely to represent multifocal fibroids. 1 of these lesions appears to be an exophytic subserosal fibroid  in the posterior lateral aspect of the uterine body on the left side although this comes in close  proximity to the left adnexa such that a primary ovarian lesion is difficult to completely exclude. CTs 02/07/2020-slight enlargement of bilateral lung nodules, some are cavitary, no evidence of metastatic disease in the abdomen or pelvis, stable right adrenal nodule, uterine fibroids CTs 04/26/2020-mild enlargement of pulmonary nodules, slight increase in trace pelvic fluid, new soft tissue thickening inferior to the cecal tip suspicious for peritoneal metastasis CT 05/26/2020-improved appearance of soft tissue at the inferior tip of the cecum, mildly thickened short appendix-findings suggestive of resolving appendicitis, stable small bibasilar pulmonary nodules, fibroids Plan biopsy of right cecal tip soft tissue canceled secondary to radiologic improvement CT chest 08/02/2020-enlargement and progressive cavitation of multiple bilateral lung nodules.  Some new nodules are present. CTs 10/24/2020- increase in size of pulmonary nodules, no new nodules, no evidence of metastatic disease in the abdomen, stable right adrenal nodule CT 01/09/2021-slight interval enlargement of pulmonary nodules, stable right adrenal nodule Navigation bronchoscopy 01/30/2021-left lower lobe cavitary nodule FNA-adenocarcinoma, brushing-adenocarcinoma.  Left lower lobe lavage-adenocarcinoma.  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 Ende appears stable.  She will return for another cycle of gemcitabine/Abraxane tomorrow.  She will return for an office visit in 2 weeks.  We will follow-up on the magnesium and potassium levels from today and supplement as indicated.  [name], MD  06/20/2021  9:26 AM

## 2021-06-21 ENCOUNTER — Inpatient Hospital Stay: Payer: BC Managed Care – PPO

## 2021-06-21 DIAGNOSIS — Z923 Personal history of irradiation: Secondary | ICD-10-CM | POA: Diagnosis not present

## 2021-06-21 DIAGNOSIS — C250 Malignant neoplasm of head of pancreas: Secondary | ICD-10-CM

## 2021-06-21 DIAGNOSIS — Z5111 Encounter for antineoplastic chemotherapy: Secondary | ICD-10-CM | POA: Diagnosis not present

## 2021-06-21 DIAGNOSIS — Z905 Acquired absence of kidney: Secondary | ICD-10-CM | POA: Diagnosis not present

## 2021-06-21 DIAGNOSIS — R918 Other nonspecific abnormal finding of lung field: Secondary | ICD-10-CM | POA: Diagnosis not present

## 2021-06-21 DIAGNOSIS — Z79899 Other long term (current) drug therapy: Secondary | ICD-10-CM | POA: Diagnosis not present

## 2021-06-21 DIAGNOSIS — E876 Hypokalemia: Secondary | ICD-10-CM

## 2021-06-21 DIAGNOSIS — Z90411 Acquired partial absence of pancreas: Secondary | ICD-10-CM | POA: Diagnosis not present

## 2021-06-21 DIAGNOSIS — Z85528 Personal history of other malignant neoplasm of kidney: Secondary | ICD-10-CM | POA: Diagnosis not present

## 2021-06-21 LAB — CANCER ANTIGEN 19-9: CA 19-9: 30 U/mL (ref 0–35)

## 2021-06-21 MED ORDER — PACLITAXEL PROTEIN-BOUND CHEMO INJECTION 100 MG
100.0000 mg/m2 | Freq: Once | INTRAVENOUS | Status: AC
  Administered 2021-06-21: 200 mg via INTRAVENOUS
  Filled 2021-06-21: qty 40

## 2021-06-21 MED ORDER — HEPARIN SOD (PORK) LOCK FLUSH 100 UNIT/ML IV SOLN
500.0000 [IU] | Freq: Once | INTRAVENOUS | Status: AC | PRN
  Administered 2021-06-21: 500 [IU]

## 2021-06-21 MED ORDER — SODIUM CHLORIDE 0.9 % IV SOLN
1000.0000 mg/m2 | Freq: Once | INTRAVENOUS | Status: AC
  Administered 2021-06-21: 1976 mg via INTRAVENOUS
  Filled 2021-06-21: qty 51.97

## 2021-06-21 MED ORDER — SODIUM CHLORIDE 0.9 % IV SOLN: Freq: Once | INTRAVENOUS | Status: AC

## 2021-06-21 MED ORDER — SODIUM CHLORIDE 0.9% FLUSH
10.0000 mL | INTRAVENOUS | Status: DC | PRN
  Administered 2021-06-21: 10 mL

## 2021-06-21 MED ORDER — POTASSIUM CHLORIDE 10 MEQ/100ML IV SOLN
10.0000 meq | INTRAVENOUS | Status: AC
  Administered 2021-06-21 (×2): 10 meq via INTRAVENOUS
  Filled 2021-06-21: qty 100

## 2021-06-21 MED ORDER — MAGNESIUM SULFATE 2 GM/50ML IV SOLN
2.0000 g | Freq: Once | INTRAVENOUS | Status: AC
  Administered 2021-06-21: 2 g via INTRAVENOUS
  Filled 2021-06-21: qty 50

## 2021-06-21 MED ORDER — PROCHLORPERAZINE MALEATE 10 MG PO TABS
10.0000 mg | ORAL_TABLET | Freq: Once | ORAL | Status: AC
  Administered 2021-06-21: 10 mg via ORAL
  Filled 2021-06-21: qty 1

## 2021-06-21 NOTE — Patient Instructions (Signed)
[facility]   Discharge Instructions: Thank you for choosing [facility] to provide your oncology and hematology care.   If you have a lab appointment with the [facility], please go directly to the [facility] and check in at the registration area.   Wear comfortable clothing and clothing appropriate for easy access to any Portacath or PICC line.   We strive to give you quality time with your provider. You may need to reschedule your appointment if you arrive late (15 or more minutes).  Arriving late affects you and other patients whose appointments are after yours.  Also, if you miss three or more appointments without notifying the office, you may be dismissed from the clinic at the providers discretion.      For prescription refill requests, have your pharmacy contact our office and allow 72 hours for refills to be completed.    Today you received the following chemotherapy and/or immunotherapy agents Abraxane, Gemzar, Potassium, Magnesium      To help prevent nausea and vomiting after your treatment, we encourage you to take your nausea medication as directed.  BELOW ARE SYMPTOMS THAT SHOULD BE REPORTED IMMEDIATELY: *FEVER GREATER THAN 100.4 F (38 C) OR HIGHER *CHILLS OR SWEATING *NAUSEA AND VOMITING THAT IS NOT CONTROLLED WITH YOUR NAUSEA MEDICATION *UNUSUAL SHORTNESS OF BREATH *UNUSUAL BRUISING OR BLEEDING *URINARY PROBLEMS (pain or burning when urinating, or frequent urination) *BOWEL PROBLEMS (unusual diarrhea, constipation, pain near the anus) TENDERNESS IN MOUTH AND THROAT WITH OR WITHOUT PRESENCE OF ULCERS (sore throat, sores in mouth, or a toothache) UNUSUAL RASH, SWELLING OR PAIN  UNUSUAL VAGINAL DISCHARGE OR ITCHING   Items with * indicate a potential emergency and should be followed up as soon as possible or go to the Emergency Department if any problems should occur.  Please show the CHEMOTHERAPY ALERT CARD or IMMUNOTHERAPY  ALERT CARD at check-in to the Emergency Department and triage nurse.  Should you have questions after your visit or need to cancel or reschedule your appointment, please contact [facility]  Dept: [phone]  and follow the prompts.  Office hours are 8:00 a.m. to 4:30 p.m. Monday - Friday. Please note that voicemails left after 4:00 p.m. may not be returned until the following business day.  We are closed weekends and major holidays. You have access to a nurse at all times for urgent questions. Please call the main number to the clinic Dept: [phone] and follow the prompts.   For any non-urgent questions, you may also contact your provider using MyChart. We now offer e-Visits for anyone 27 and older to request care online for non-urgent symptoms. For details visit [email or web address].   Also download the MyChart app! Go to the app store, search "MyChart", open the app, select [facility], and log in with your MyChart username and password.  Due to Covid, a mask is required upon entering the hospital/clinic. If you do not have a mask, one will be given to you upon arrival. For doctor visits, patients may have 1 support person aged 1 or older with them. For treatment visits, patients cannot have anyone with them due to current Covid guidelines and our immunocompromised population.   Nanoparticle Albumin-Bound Paclitaxel injection What is this medication? NANOPARTICLE ALBUMIN-BOUND PACLITAXEL (Na no PAHR ti kuhl  al BYOO muhn-bound  PAK li TAX el) is a chemotherapy drug. It targets fast dividing cells, like cancer cells, and causes these cells to die. This medicine  is used to treat advanced breast cancer, lung cancer, and pancreatic cancer. This medicine may be used for other purposes; ask your health care provider or pharmacist if you have questions. COMMON BRAND NAME(S): Abraxane What should I tell my care team before I take this medication? They need to know if  you have any of these conditions: kidney disease liver disease low blood counts, like low white cell, platelet, or red cell counts lung or breathing disease, like asthma tingling of the fingers or toes, or other nerve disorder an unusual or allergic reaction to paclitaxel, albumin, other chemotherapy, other medicines, foods, dyes, or preservatives pregnant or trying to get pregnant breast-feeding How should I use this medication? This drug is given as an infusion into a vein. It is administered in a hospital or clinic by a specially trained health care professional. Talk to your pediatrician regarding the use of this medicine in children. Special care may be needed. Overdosage: If you think you have taken too much of this medicine contact a poison control center or emergency room at once. NOTE: This medicine is only for you. Do not share this medicine with others. What if I miss a dose? It is important not to miss your dose. Call your doctor or health care professional if you are unable to keep an appointment. What may interact with this medication? This medicine may interact with the following medications: antiviral medicines for hepatitis, HIV or AIDS certain antibiotics like erythromycin and clarithromycin certain medicines for fungal infections like ketoconazole and itraconazole certain medicines for seizures like carbamazepine, phenobarbital, phenytoin gemfibrozil nefazodone rifampin St. John's wort This list may not describe all possible interactions. Give your health care provider a list of all the medicines, herbs, non-prescription drugs, or dietary supplements you use. Also tell them if you smoke, drink alcohol, or use illegal drugs. Some items may interact with your medicine. What should I watch for while using this medication? Your condition will be monitored carefully while you are receiving this medicine. You will need important blood work done while you are taking this  medicine. This medicine can cause serious allergic reactions. If you experience allergic reactions like skin rash, itching or hives, swelling of the face, lips, or tongue, tell your doctor or health care professional right away. In some cases, you may be given additional medicines to help with side effects. Follow all directions for their use. This drug may make you feel generally unwell. This is not uncommon, as chemotherapy can affect healthy cells as well as cancer cells. Report any side effects. Continue your course of treatment even though you feel ill unless your doctor tells you to stop. Call your doctor or health care professional for advice if you get a fever, chills or sore throat, or other symptoms of a cold or flu. Do not treat yourself. This drug decreases your body's ability to fight infections. Try to avoid being around people who are sick. This medicine may increase your risk to bruise or bleed. Call your doctor or health care professional if you notice any unusual bleeding. Be careful brushing and flossing your teeth or using a toothpick because you may get an infection or bleed more easily. If you have any dental work done, tell your dentist you are receiving this medicine. Avoid taking products that contain aspirin, acetaminophen, ibuprofen, naproxen, or ketoprofen unless instructed by your doctor. These medicines may hide a fever. Do not become pregnant while taking this medicine or for 6 months after stopping it.  Women should inform their doctor if they wish to become pregnant or think they might be pregnant. Men should not father a child while taking this medicine or for 3 months after stopping it. There is a potential for serious side effects to an unborn child. Talk to your health care professional or pharmacist for more information. Do not breast-feed an infant while taking this medicine or for 2 weeks after stopping it. This medicine may interfere with the ability to get pregnant  or to father a child. You should talk to your doctor or health care professional if you are concerned about your fertility. What side effects may I notice from receiving this medication? Side effects that you should report to your doctor or health care professional as soon as possible: allergic reactions like skin rash, itching or hives, swelling of the face, lips, or tongue breathing problems changes in vision fast, irregular heartbeat low blood pressure mouth sores pain, tingling, numbness in the hands or feet signs of decreased platelets or bleeding - bruising, pinpoint red spots on the skin, black, tarry stools, blood in the urine signs of decreased red blood cells - unusually weak or tired, feeling faint or lightheaded, falls signs of infection - fever or chills, cough, sore throat, pain or difficulty passing urine signs and symptoms of liver injury like dark yellow or brown urine; general ill feeling or flu-like symptoms; light-colored stools; loss of appetite; nausea; right upper belly pain; unusually weak or tired; yellowing of the eyes or skin swelling of the ankles, feet, hands unusually slow heartbeat Side effects that usually do not require medical attention (report to your doctor or health care professional if they continue or are bothersome): diarrhea hair loss loss of appetite nausea, vomiting tiredness This list may not describe all possible side effects. Call your doctor for medical advice about side effects. You may report side effects to FDA at 1-800-FDA-1088. Where should I keep my medication? This drug is given in a hospital or clinic and will not be stored at home. NOTE: This sheet is a summary. It may not cover all possible information. If you have questions about this medicine, talk to your doctor, pharmacist, or health care provider.  2022 Elsevier/Gold Standard (2017-01-28 00:00:00)  Gemcitabine injection What is this medication? GEMCITABINE (jem SYE ta been)  is a chemotherapy drug. This medicine is used to treat many types of cancer like breast cancer, lung cancer, pancreatic cancer, and ovarian cancer. This medicine may be used for other purposes; ask your health care provider or pharmacist if you have questions. COMMON BRAND NAME(S): Gemzar, Infugem What should I tell my care team before I take this medication? They need to know if you have any of these conditions: blood disorders infection kidney disease liver disease lung or breathing disease, like asthma recent or ongoing radiation therapy an unusual or allergic reaction to gemcitabine, other chemotherapy, other medicines, foods, dyes, or preservatives pregnant or trying to get pregnant breast-feeding How should I use this medication? This drug is given as an infusion into a vein. It is administered in a hospital or clinic by a specially trained health care professional. Talk to your pediatrician regarding the use of this medicine in children. Special care may be needed. Overdosage: If you think you have taken too much of this medicine contact a poison control center or emergency room at once. NOTE: This medicine is only for you. Do not share this medicine with others. What if I miss a dose? It is  important not to miss your dose. Call your doctor or health care professional if you are unable to keep an appointment. What may interact with this medication? medicines to increase blood counts like filgrastim, pegfilgrastim, sargramostim some other chemotherapy drugs like cisplatin vaccines Talk to your doctor or health care professional before taking any of these medicines: acetaminophen aspirin ibuprofen ketoprofen naproxen This list may not describe all possible interactions. Give your health care provider a list of all the medicines, herbs, non-prescription drugs, or dietary supplements you use. Also tell them if you smoke, drink alcohol, or use illegal drugs. Some items may interact  with your medicine. What should I watch for while using this medication? Visit your doctor for checks on your progress. This drug may make you feel generally unwell. This is not uncommon, as chemotherapy can affect healthy cells as well as cancer cells. Report any side effects. Continue your course of treatment even though you feel ill unless your doctor tells you to stop. In some cases, you may be given additional medicines to help with side effects. Follow all directions for their use. Call your doctor or health care professional for advice if you get a fever, chills or sore throat, or other symptoms of a cold or flu. Do not treat yourself. This drug decreases your body's ability to fight infections. Try to avoid being around people who are sick. This medicine may increase your risk to bruise or bleed. Call your doctor or health care professional if you notice any unusual bleeding. Be careful brushing and flossing your teeth or using a toothpick because you may get an infection or bleed more easily. If you have any dental work done, tell your dentist you are receiving this medicine. Avoid taking products that contain aspirin, acetaminophen, ibuprofen, naproxen, or ketoprofen unless instructed by your doctor. These medicines may hide a fever. Do not become pregnant while taking this medicine or for 6 months after stopping it. Women should inform their doctor if they wish to become pregnant or think they might be pregnant. Men should not father a child while taking this medicine and for 3 months after stopping it. There is a potential for serious side effects to an unborn child. Talk to your health care professional or pharmacist for more information. Do not breast-feed an infant while taking this medicine or for at least 1 week after stopping it. Men should inform their doctors if they wish to father a child. This medicine may lower sperm counts. Talk with your doctor or health care professional if you are  concerned about your fertility. What side effects may I notice from receiving this medication? Side effects that you should report to your doctor or health care professional as soon as possible: allergic reactions like skin rash, itching or hives, swelling of the face, lips, or tongue breathing problems pain, redness, or irritation at site where injected signs and symptoms of a dangerous change in heartbeat or heart rhythm like chest pain; dizziness; fast or irregular heartbeat; palpitations; feeling faint or lightheaded, falls; breathing problems signs of decreased platelets or bleeding - bruising, pinpoint red spots on the skin, black, tarry stools, blood in the urine signs of decreased red blood cells - unusually weak or tired, feeling faint or lightheaded, falls signs of infection - fever or chills, cough, sore throat, pain or difficulty passing urine signs and symptoms of kidney injury like trouble passing urine or change in the amount of urine signs and symptoms of liver injury like dark  yellow or brown urine; general ill feeling or flu-like symptoms; light-colored stools; loss of appetite; nausea; right upper belly pain; unusually weak or tired; yellowing of the eyes or skin swelling of ankles, feet, hands Side effects that usually do not require medical attention (report to your doctor or health care professional if they continue or are bothersome): constipation diarrhea hair loss loss of appetite nausea rash vomiting This list may not describe all possible side effects. Call your doctor for medical advice about side effects. You may report side effects to FDA at 1-800-FDA-1088. Where should I keep my medication? This drug is given in a hospital or clinic and will not be stored at home. NOTE: This sheet is a summary. It may not cover all possible information. If you have questions about this medicine, talk to your doctor, pharmacist, or health care provider.  2022 Elsevier/Gold  Standard (2017-08-20 00:00:00)  Hypokalemia Hypokalemia means that the amount of potassium in the blood is lower than normal. Potassium is a chemical (electrolyte) that helps regulate the amount of fluid in the body. It also stimulates muscle tightening (contraction) and helps nerves work properly. Normally, most of the body's potassium is inside cells, and only a very small amount is in the blood. Because the amount in the blood is so small, minor changes to potassium levels in the blood can be life-threatening. What are the causes? This condition may be caused by: Antibiotic medicine. Diarrhea or vomiting. Taking too much of a medicine that helps you have a bowel movement (laxative) can cause diarrhea and lead to hypokalemia. Chronic kidney disease (CKD). Medicines that help the body get rid of excess fluid (diuretics). Eating disorders, such as bulimia. Low magnesium levels in the body. Sweating a lot. What are the signs or symptoms? Symptoms of this condition include: Weakness. Constipation. Fatigue. Muscle cramps. Mental confusion. Skipped heartbeats or irregular heartbeat (palpitations). Tingling or numbness. How is this diagnosed? This condition is diagnosed with a blood test. How is this treated? This condition may be treated by: Taking potassium supplements by mouth. Adjusting the medicines that you take. Eating more foods that contain a lot of potassium. If your potassium level is very low, you may need to get potassium through an IV and be monitored in the hospital. Follow these instructions at home:  Take over-the-counter and prescription medicines only as told by your health care provider. This includes vitamins and supplements. Eat a healthy diet. A healthy diet includes fresh fruits and vegetables, whole grains, healthy fats, and lean proteins. If instructed, eat more foods that contain a lot of potassium. This includes: Nuts, such as peanuts and pistachios. Seeds,  such as sunflower seeds and pumpkin seeds. Peas, lentils, and lima beans. Whole grain and bran cereals and breads. Fresh fruits and vegetables, such as apricots, avocado, bananas, cantaloupe, kiwi, oranges, tomatoes, asparagus, and potatoes. Orange juice. Tomato juice. Red meats. Yogurt. Keep all follow-up visits as told by your health care provider. This is important. Contact a health care provider if you: Have weakness that gets worse. Feel your heart pounding or racing. Vomit. Have diarrhea. Have diabetes (diabetes mellitus) and you have trouble keeping your blood sugar (glucose) in your target range. Get help right away if you: Have chest pain. Have shortness of breath. Have vomiting or diarrhea that lasts for more than 2 days. Faint. Summary Hypokalemia means that the amount of potassium in the blood is lower than normal. This condition is diagnosed with a blood test. Hypokalemia may be treated  by taking potassium supplements, adjusting the medicines that you take, or eating more foods that are high in potassium. If your potassium level is very low, you may need to get potassium through an IV and be monitored in the hospital. This information is not intended to replace advice given to you by your health care provider. Make sure you discuss any questions you have with your health care provider. Document Revised: 01/06/2018 Document Reviewed: 01/07/2018 Elsevier Patient Education  2022 [employer].  Hypomagnesemia Hypomagnesemia is a condition in which the level of magnesium in the blood is too low. Magnesium is a mineral that is found in many foods. It is used in many different processes in the body. Hypomagnesemia can affect every organ in the body. In severe cases, it can cause life-threatening problems. What are the causes? This condition may be caused by: Not getting enough magnesium in your diet or not having enough healthy foods to eat (malnutrition). Problems with  magnesium absorption in the intestines. Dehydration. Excessive use of alcohol. Vomiting. Severe or long-term (chronic) diarrhea. Some medicines, including medicines that make you urinate more often (diuretics). Certain diseases, such as kidney disease, diabetes, celiac disease, and overactive thyroid. What are the signs or symptoms? Symptoms of this condition include: Loss of appetite, nausea, and vomiting. Involuntary shaking or trembling of a body part (tremor). Muscle weakness or tingling in the arms and legs. Sudden tightening of muscles (muscle spasms). Confusion. Psychiatric issues, such as: Depression and irritability. Psychosis. A feeling of fluttering of the heart (palpitations). Seizures. These symptoms are more severe if magnesium levels drop suddenly. How is this diagnosed? This condition may be diagnosed based on: Your symptoms and medical history. A physical exam. Blood and urine tests. How is this treated? Treatment depends on the cause and the severity of the condition. It may be treated by: Taking a magnesium supplement. This can be taken in pill form. If the condition is severe, magnesium is usually given through an IV. Making changes to your diet. You may be directed to eat foods that have a lot of magnesium, such as green leafy vegetables, peas, beans, and nuts. Not drinking alcohol. If you are struggling not to drink, ask your health care provider for help. Follow these instructions at home: Eating and drinking   Make sure that your diet includes foods with magnesium. Foods that have a lot of magnesium in them include: Green leafy vegetables, such as spinach and broccoli. Beans and peas. Nuts and seeds, such as almonds and sunflower seeds. Whole grains, such as whole grain bread and fortified cereals. Drink fluids that contain salts and minerals (electrolytes), such as sports drinks, when you are active. Do not drink alcohol. General instructions Take  over-the-counter and prescription medicines only as told by your health care provider. Take magnesium supplements as directed if your health care provider tells you to take them. Have your magnesium levels monitored as told by your health care provider. Keep all follow-up visits. This is important. Contact a health care provider if: You get worse instead of better. Your symptoms return. Get help right away if: You develop severe muscle weakness. You have trouble breathing. You feel that your heart is racing. These symptoms may represent a serious problem that is an emergency. Do not wait to see if the symptoms will go away. Get medical help right away. Call your local emergency services (911 in the [place] Do not drive yourself to the hospital. Summary Hypomagnesemia is a condition in which the  level of magnesium in the blood is too low. Hypomagnesemia can affect every organ in the body. Treatment may include eating more foods that contain magnesium, taking magnesium supplements, and not drinking alcohol. Have your magnesium levels monitored as told by your health care provider. This information is not intended to replace advice given to you by your health care provider. Make sure you discuss any questions you have with your health care provider. Document Revised: 10/24/2020 Document Reviewed: 10/24/2020 Elsevier Patient Education  [employer].

## 2021-06-21 NOTE — Progress Notes (Signed)
Patient presents for treatment. RN assessment completed along with the following:  Labs/vitals reviewed - Yes, and Mag 1.8 K 3. Ok to treat   Labs obtained 06/20/21 Weight within 10% of previous measurement - Yes Oncology Treatment Attestation completed for current therapy- Yes, on date 03/02/21 Informed consent completed and reflects current therapy/intent - Yes, on date 03/28/21             Provider progress note reviewed -  1 /11/23  Treatment/Antibody/Supportive plan reviewed - Yes, and there are no adjustments needed for today's treatment. S&H and other orders reviewed - Yes, and mag and potassium ordered Previous treatment date reviewed - Yes, and the appropriate amount of time has elapsed between treatments. Clinic Hand Off Received from - none  Patient to proceed with treatment.

## 2021-06-22 ENCOUNTER — Encounter: Payer: Self-pay | Admitting: Internal Medicine

## 2021-06-22 DIAGNOSIS — H43813 Vitreous degeneration, bilateral: Secondary | ICD-10-CM | POA: Diagnosis not present

## 2021-06-22 DIAGNOSIS — H43391 Other vitreous opacities, right eye: Secondary | ICD-10-CM | POA: Diagnosis not present

## 2021-06-22 DIAGNOSIS — H31091 Other chorioretinal scars, right eye: Secondary | ICD-10-CM | POA: Diagnosis not present

## 2021-06-22 DIAGNOSIS — H35412 Lattice degeneration of retina, left eye: Secondary | ICD-10-CM | POA: Diagnosis not present

## 2021-06-27 ENCOUNTER — Encounter: Payer: Self-pay | Admitting: Oncology

## 2021-06-27 ENCOUNTER — Other Ambulatory Visit: Payer: Self-pay

## 2021-06-27 MED ORDER — MAGIC MOUTHWASH: 5.0000 mL | Freq: Four times a day (QID) | ORAL | 0 refills | Status: DC | PRN

## 2021-06-29 DIAGNOSIS — C259 Malignant neoplasm of pancreas, unspecified: Secondary | ICD-10-CM | POA: Diagnosis not present

## 2021-06-29 DIAGNOSIS — N6321 Unspecified lump in the left breast, upper outer quadrant: Secondary | ICD-10-CM | POA: Diagnosis not present

## 2021-06-29 DIAGNOSIS — C7800 Secondary malignant neoplasm of unspecified lung: Secondary | ICD-10-CM | POA: Diagnosis not present

## 2021-06-30 ENCOUNTER — Other Ambulatory Visit: Payer: Self-pay | Admitting: Oncology

## 2021-07-04 ENCOUNTER — Inpatient Hospital Stay (HOSPITAL_BASED_OUTPATIENT_CLINIC_OR_DEPARTMENT_OTHER): Payer: BC Managed Care – PPO | Admitting: Nurse Practitioner

## 2021-07-04 ENCOUNTER — Other Ambulatory Visit: Payer: Self-pay

## 2021-07-04 ENCOUNTER — Inpatient Hospital Stay: Payer: BC Managed Care – PPO

## 2021-07-04 ENCOUNTER — Encounter: Payer: Self-pay | Admitting: *Deleted

## 2021-07-04 ENCOUNTER — Encounter: Payer: Self-pay | Admitting: Nurse Practitioner

## 2021-07-04 VITALS — BP 126/76 | HR 98 | Temp 97.9°F | Resp 18 | Ht 68.0 in | Wt 184.8 lb

## 2021-07-04 DIAGNOSIS — Z85528 Personal history of other malignant neoplasm of kidney: Secondary | ICD-10-CM | POA: Diagnosis not present

## 2021-07-04 DIAGNOSIS — Z90411 Acquired partial absence of pancreas: Secondary | ICD-10-CM | POA: Diagnosis not present

## 2021-07-04 DIAGNOSIS — Z79899 Other long term (current) drug therapy: Secondary | ICD-10-CM | POA: Diagnosis not present

## 2021-07-04 DIAGNOSIS — C250 Malignant neoplasm of head of pancreas: Secondary | ICD-10-CM | POA: Diagnosis not present

## 2021-07-04 DIAGNOSIS — Z905 Acquired absence of kidney: Secondary | ICD-10-CM | POA: Diagnosis not present

## 2021-07-04 DIAGNOSIS — Z923 Personal history of irradiation: Secondary | ICD-10-CM | POA: Diagnosis not present

## 2021-07-04 DIAGNOSIS — Z5111 Encounter for antineoplastic chemotherapy: Secondary | ICD-10-CM | POA: Diagnosis not present

## 2021-07-04 DIAGNOSIS — R918 Other nonspecific abnormal finding of lung field: Secondary | ICD-10-CM | POA: Diagnosis not present

## 2021-07-04 DIAGNOSIS — E876 Hypokalemia: Secondary | ICD-10-CM | POA: Diagnosis not present

## 2021-07-04 LAB — CBC WITH DIFFERENTIAL (CANCER CENTER ONLY)
Abs Immature Granulocytes: 0.01 10*3/uL (ref 0.00–0.07)
Basophils Absolute: 0 10*3/uL (ref 0.0–0.1)
Basophils Relative: 1 %
Eosinophils Absolute: 0.2 10*3/uL (ref 0.0–0.5)
Eosinophils Relative: 4 %
HCT: 26.6 % — ABNORMAL LOW (ref 36.0–46.0)
Hemoglobin: 7.9 g/dL — ABNORMAL LOW (ref 12.0–15.0)
Immature Granulocytes: 0 %
Lymphocytes Relative: 27 %
Lymphs Abs: 1.5 10*3/uL (ref 0.7–4.0)
MCH: 27.4 pg (ref 26.0–34.0)
MCHC: 29.7 g/dL — ABNORMAL LOW (ref 30.0–36.0)
MCV: 92.4 fL (ref 80.0–100.0)
Monocytes Absolute: 0.7 10*3/uL (ref 0.1–1.0)
Monocytes Relative: 12 %
Neutro Abs: 3.2 10*3/uL (ref 1.7–7.7)
Neutrophils Relative %: 56 %
Platelet Count: 459 10*3/uL — ABNORMAL HIGH (ref 150–400)
RBC: 2.88 MIL/uL — ABNORMAL LOW (ref 3.87–5.11)
RDW: 16.3 % — ABNORMAL HIGH (ref 11.5–15.5)
WBC Count: 5.7 10*3/uL (ref 4.0–10.5)
nRBC: 0 % (ref 0.0–0.2)

## 2021-07-04 LAB — CMP (CANCER CENTER ONLY)
ALT: 19 U/L (ref 0–44)
AST: 17 U/L (ref 15–41)
Albumin: 2.7 g/dL — ABNORMAL LOW (ref 3.5–5.0)
Alkaline Phosphatase: 172 U/L — ABNORMAL HIGH (ref 38–126)
Anion gap: 7 (ref 5–15)
BUN: 6 mg/dL (ref 6–20)
CO2: 29 mmol/L (ref 22–32)
Calcium: 8.2 mg/dL — ABNORMAL LOW (ref 8.9–10.3)
Chloride: 105 mmol/L (ref 98–111)
Creatinine: 0.91 mg/dL (ref 0.44–1.00)
GFR, Estimated: >60 mL/min (ref >60)
Glucose, Bld: 112 mg/dL — ABNORMAL HIGH (ref 70–99)
Potassium: 2.9 mmol/L — ABNORMAL LOW (ref 3.5–5.1)
Sodium: 141 mmol/L (ref 135–145)
Total Bilirubin: 0.3 mg/dL (ref 0.3–1.2)
Total Protein: 6.1 g/dL — ABNORMAL LOW (ref 6.5–8.1)

## 2021-07-04 LAB — MAGNESIUM: Magnesium: 1.7 mg/dL (ref 1.7–2.4)

## 2021-07-04 MED ORDER — POTASSIUM CHLORIDE CRYS ER 20 MEQ PO TBCR: 20.0000 meq | EXTENDED_RELEASE_TABLET | Freq: Four times a day (QID) | ORAL | 2 refills | Status: DC

## 2021-07-04 NOTE — Progress Notes (Signed)
Patient seen by Ned Card NP today  Vitals are within treatment parameters.  Labs reviewed by Ned Card NP and are within treatment parameters.OK to treat w/K+ 2.9 and Hgb 7.9  Per physician team, patient is ready for treatment. Please note that modifications are being made to the treatment plan including Will add KCL 20 meq IV + Magnesium sulfate 2 grams IV to infusion on 07/05/21.   Orders placed under sign and hold. No change in chemo dosing for 07/05/21.

## 2021-07-04 NOTE — Progress Notes (Signed)
[facility] OFFICE PROGRESS NOTE   Diagnosis:  Pancreas cancer  INTERVAL HISTORY:   Ms. Veazie returns as scheduled.  She completed another cycle of gemcitabine/Abraxane 06/21/2021.  She denies nausea/vomiting.  Several days of mouth and throat soreness, may be a single ulcer.  No diarrhea.  No significant constipation.  She had an episode of cold sensitivity involving a few fingers.  This has resolved.  Baseline neuropathy symptoms overall unchanged.  She continues to note lower leg edema.  Objective:  Vital signs in last 24 hours:  Blood pressure 126/76, pulse 98, temperature 97.9 F (36.6 C), temperature source Oral, resp. rate 18, height 5' 8"  (1.727 m), weight 184 lb 12.8 oz (83.8 kg), last menstrual period 09/17/2012, SpO2 100 %.    HEENT: No thrush or ulcers. Resp: Lungs clear bilaterally. Cardio: Regular rate and rhythm. GI: Abdomen soft and nontender.  No hepatosplenomegaly. Vascular: Trace pitting edema lower leg bilaterally. Skin: No rash. Port-A-Cath without erythema.   Lab Results:  Lab Results  Component Value Date   WBC 5.7 07/04/2021   HGB 7.9 (L) 07/04/2021   HCT 26.6 (L) 07/04/2021   MCV 92.4 07/04/2021   PLT 459 (H) 07/04/2021   NEUTROABS 3.2 07/04/2021    Imaging:  No results found.  Medications: I have reviewed the patient's current medications.  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 Tumor invades the duodenal wall and vascular groove, resection margins negative, 4/34 lymph nodes positive MSI-stable, tumor showed instability in 2 loci as did adjacent normal tissue EUS FNA biopsy of pancreas mass on 07/03/2018-well-differentiated neuroendocrine tumor CTs 01/29/2019-ill-defined pancreas head mass, 5 pulmonary nodules-1 with a small amount of central cavitation, tumor abuts the left margin of the portal vein indistinct density surrounding, hepatic artery, complex cystic  lesion of the right kidney, right adrenal mass-characterized as an adenoma on a [facility] MRI 12/21/2018 [facility] 03/03/2019-no focal pancreas activity, no tracer accumulation within the suspicious pulmonary nodules, left uterine mass with tracer accumulation felt to represent a leiomyoma Elevated preoperative CA 19-9--CA 19-9 186 on 01/18/2019 CT chest 04/16/2019-multiple bilateral pulmonary nodules, some with increased cavitation, stable in size Cycle 1 FOLFIRINOX 04/27/2019 Cycle 2 FOLFIRINOX 05/11/2019 Cycle 3 FOLFIRINOX 05/23/2019 Cycle 4 FOLFIRINOX 06/08/2019 Cycle 5 FOLFIRINOX 06/22/2019 CT chest 07/02/2019-stable size of bilateral pulmonary nodules.  Dominant cavitary lesions in both lungs show increased cavitation with thinner walls.  Stable 2.1 cm right adrenal nodule. 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 No definite evidence of metastatic disease in the abdomen or pelvis. Markedly enlarged and heterogeneous appearing uterus, likely to represent multifocal fibroids. 1 of these lesions appears to be an exophytic subserosal fibroid in the posterior lateral aspect of the uterine body on the left side although this comes in close proximity to the left adnexa such that a primary ovarian lesion is difficult to completely exclude. CTs 02/07/2020-slight enlargement of bilateral lung nodules, some are cavitary, no evidence of metastatic disease in the abdomen or pelvis, stable right adrenal nodule, uterine fibroids CTs 04/26/2020-mild enlargement of pulmonary nodules, slight increase in trace  pelvic fluid, new soft tissue thickening inferior to the cecal tip suspicious for peritoneal metastasis  CT 05/26/2020-improved appearance of soft tissue at the inferior tip of the cecum, mildly thickened short appendix-findings suggestive of resolving appendicitis, stable small bibasilar pulmonary nodules, fibroids Plan biopsy of right cecal tip soft tissue canceled secondary to radiologic improvement CT chest 08/02/2020-enlargement and progressive cavitation of multiple bilateral lung nodules.  Some new nodules are present. CTs 10/24/2020- increase in size of pulmonary nodules, no new nodules, no evidence of metastatic disease in the abdomen, stable right adrenal nodule CT 01/09/2021-slight interval enlargement of pulmonary nodules, stable right adrenal nodule Navigation bronchoscopy 01/30/2021-left lower lobe cavitary nodule FNA-adenocarcinoma, brushing-adenocarcinoma.  Left lower lobe lavage-adenocarcinoma.  Right upper lobe brushing and FNA biopsy of cavitary nodule-adenocarcinoma-immunohistochemical profile consistent with pancreas adenocarcinoma Cycle 1 gemcitabine/Abraxane 03/28/2021 Cycle 2 gemcitabine/Abraxane 04/11/2021 Cycle 3 gemcitabine/Abraxane 04/25/2021 Cycle 4 gemcitabine/Abraxane 05/09/2021 Cycle 5 gemcitabine/Abraxane 05/23/2021 CT chest 06/05/2021-interval cavitation of multiple pulmonary nodules, some nodules have decreased in size, no new or enlarging nodules Cycle 6 gemcitabine/Abraxane 06/06/2021 Cycle 7 gemcitabine/Abraxane 06/21/2021 Cycle 8 gemcitabine/Abraxane 07/05/2021   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 Jastrzebski appears stable.  She has completed 7 cycles of gemcitabine/Abraxane.  Most recent CA 19-9 tumor marker was further improved, now into the normal range.  Plan to proceed with cycle 8 gemcitabine/Abraxane tomorrow as scheduled.  CBC and chemistry panel reviewed.  Labs adequate to proceed with treatment.  She has progressive anemia.  She is asymptomatic.  She understands to contact the office with signs/symptoms suggestive of progressive anemia.  We will obtain a blood bank tube when she returns in 2 weeks.  She continues to have hypokalemia.  Magnesium is in low normal range.  She will receive IV potassium and magnesium 07/05/2021.  She will return for lab and follow-up in 2 weeks.  We are available to see her sooner if needed.    Ned Card ANP/GNP-BC   07/04/2021  9:02 AM

## 2021-07-05 ENCOUNTER — Ambulatory Visit: Payer: BC Managed Care – PPO

## 2021-07-05 ENCOUNTER — Inpatient Hospital Stay: Payer: BC Managed Care – PPO

## 2021-07-05 ENCOUNTER — Other Ambulatory Visit: Payer: Self-pay | Admitting: Endocrinology

## 2021-07-05 DIAGNOSIS — C250 Malignant neoplasm of head of pancreas: Secondary | ICD-10-CM | POA: Diagnosis not present

## 2021-07-05 DIAGNOSIS — Z5111 Encounter for antineoplastic chemotherapy: Secondary | ICD-10-CM | POA: Diagnosis not present

## 2021-07-05 DIAGNOSIS — E876 Hypokalemia: Secondary | ICD-10-CM

## 2021-07-05 DIAGNOSIS — Z923 Personal history of irradiation: Secondary | ICD-10-CM | POA: Diagnosis not present

## 2021-07-05 DIAGNOSIS — Z79899 Other long term (current) drug therapy: Secondary | ICD-10-CM | POA: Diagnosis not present

## 2021-07-05 DIAGNOSIS — Z905 Acquired absence of kidney: Secondary | ICD-10-CM | POA: Diagnosis not present

## 2021-07-05 DIAGNOSIS — R918 Other nonspecific abnormal finding of lung field: Secondary | ICD-10-CM | POA: Diagnosis not present

## 2021-07-05 DIAGNOSIS — Z85528 Personal history of other malignant neoplasm of kidney: Secondary | ICD-10-CM | POA: Diagnosis not present

## 2021-07-05 DIAGNOSIS — Z90411 Acquired partial absence of pancreas: Secondary | ICD-10-CM | POA: Diagnosis not present

## 2021-07-05 LAB — CANCER ANTIGEN 19-9: CA 19-9: 25 U/mL (ref 0–35)

## 2021-07-05 MED ORDER — SODIUM CHLORIDE 0.9 % IV SOLN
1000.0000 mg/m2 | Freq: Once | INTRAVENOUS | Status: AC
  Administered 2021-07-05: 1976 mg via INTRAVENOUS
  Filled 2021-07-05: qty 51.97

## 2021-07-05 MED ORDER — PACLITAXEL PROTEIN-BOUND CHEMO INJECTION 100 MG
100.0000 mg/m2 | Freq: Once | INTRAVENOUS | Status: AC
  Administered 2021-07-05: 200 mg via INTRAVENOUS
  Filled 2021-07-05: qty 40

## 2021-07-05 MED ORDER — HEPARIN SOD (PORK) LOCK FLUSH 100 UNIT/ML IV SOLN
500.0000 [IU] | Freq: Once | INTRAVENOUS | Status: AC | PRN
  Administered 2021-07-05: 500 [IU]

## 2021-07-05 MED ORDER — SODIUM CHLORIDE 0.9 % IV SOLN: Freq: Once | INTRAVENOUS | Status: AC

## 2021-07-05 MED ORDER — MAGNESIUM SULFATE 2 GM/50ML IV SOLN
2.0000 g | Freq: Once | INTRAVENOUS | Status: AC
  Administered 2021-07-05: 2 g via INTRAVENOUS
  Filled 2021-07-05: qty 50

## 2021-07-05 MED ORDER — SODIUM CHLORIDE 0.9% FLUSH
10.0000 mL | INTRAVENOUS | Status: DC | PRN
  Administered 2021-07-05: 10 mL

## 2021-07-05 MED ORDER — POTASSIUM CHLORIDE 10 MEQ/100ML IV SOLN
10.0000 meq | INTRAVENOUS | Status: AC
  Administered 2021-07-05 (×2): 10 meq via INTRAVENOUS
  Filled 2021-07-05 (×2): qty 100

## 2021-07-05 MED ORDER — PROCHLORPERAZINE MALEATE 10 MG PO TABS
10.0000 mg | ORAL_TABLET | Freq: Once | ORAL | Status: AC
  Administered 2021-07-05: 10 mg via ORAL
  Filled 2021-07-05: qty 1

## 2021-07-05 NOTE — Patient Instructions (Signed)
[facility]   Discharge Instructions: Thank you for choosing [facility] to provide your oncology and hematology care.   If you have a lab appointment with the [facility], please go directly to the [facility] and check in at the registration area.   Wear comfortable clothing and clothing appropriate for easy access to any Portacath or PICC line.   We strive to give you quality time with your provider. You may need to reschedule your appointment if you arrive late (15 or more minutes).  Arriving late affects you and other patients whose appointments are after yours.  Also, if you miss three or more appointments without notifying the office, you may be dismissed from the clinic at the providers discretion.      For prescription refill requests, have your pharmacy contact our office and allow 72 hours for refills to be completed.    Today you received the following chemotherapy and/or immunotherapy agents Abraxane, Gemzar, Potassium, Magnesium      To help prevent nausea and vomiting after your treatment, we encourage you to take your nausea medication as directed.  BELOW ARE SYMPTOMS THAT SHOULD BE REPORTED IMMEDIATELY: *FEVER GREATER THAN 100.4 F (38 C) OR HIGHER *CHILLS OR SWEATING *NAUSEA AND VOMITING THAT IS NOT CONTROLLED WITH YOUR NAUSEA MEDICATION *UNUSUAL SHORTNESS OF BREATH *UNUSUAL BRUISING OR BLEEDING *URINARY PROBLEMS (pain or burning when urinating, or frequent urination) *BOWEL PROBLEMS (unusual diarrhea, constipation, pain near the anus) TENDERNESS IN MOUTH AND THROAT WITH OR WITHOUT PRESENCE OF ULCERS (sore throat, sores in mouth, or a toothache) UNUSUAL RASH, SWELLING OR PAIN  UNUSUAL VAGINAL DISCHARGE OR ITCHING   Items with * indicate a potential emergency and should be followed up as soon as possible or go to the Emergency Department if any problems should occur.  Please show the CHEMOTHERAPY ALERT CARD or  IMMUNOTHERAPY ALERT CARD at check-in to the Emergency Department and triage nurse.  Should you have questions after your visit or need to cancel or reschedule your appointment, please contact [facility]  Dept: [phone]  and follow the prompts.  Office hours are 8:00 a.m. to 4:30 p.m. Monday - Friday. Please note that voicemails left after 4:00 p.m. may not be returned until the following business day.  We are closed weekends and major holidays. You have access to a nurse at all times for urgent questions. Please call the main number to the clinic Dept: [phone] and follow the prompts.   For any non-urgent questions, you may also contact your provider using MyChart. We now offer e-Visits for anyone 2 and older to request care online for non-urgent symptoms. For details visit [email or web address].   Also download the MyChart app! Go to the app store, search "MyChart", open the app, select [facility], and log in with your MyChart username and password.  Due to Covid, a mask is required upon entering the hospital/clinic. If you do not have a mask, one will be given to you upon arrival. For doctor visits, patients may have 1 support person aged 67 or older with them. For treatment visits, patients cannot have anyone with them due to current Covid guidelines and our immunocompromised population.   Nanoparticle Albumin-Bound Paclitaxel injection What is this medication? NANOPARTICLE ALBUMIN-BOUND PACLITAXEL (Na no PAHR ti kuhl  al BYOO muhn-bound  PAK li TAX el) is a chemotherapy drug. It targets fast dividing cells, like cancer cells, and causes these cells to die. This medicine  is used to treat advanced breast cancer, lung cancer, and pancreatic cancer. This medicine may be used for other purposes; ask your health care provider or pharmacist if you have questions. COMMON BRAND NAME(S): Abraxane What should I tell my care team before I take this medication? They  need to know if you have any of these conditions: kidney disease liver disease low blood counts, like low white cell, platelet, or red cell counts lung or breathing disease, like asthma tingling of the fingers or toes, or other nerve disorder an unusual or allergic reaction to paclitaxel, albumin, other chemotherapy, other medicines, foods, dyes, or preservatives pregnant or trying to get pregnant breast-feeding How should I use this medication? This drug is given as an infusion into a vein. It is administered in a hospital or clinic by a specially trained health care professional. Talk to your pediatrician regarding the use of this medicine in children. Special care may be needed. Overdosage: If you think you have taken too much of this medicine contact a poison control center or emergency room at once. NOTE: This medicine is only for you. Do not share this medicine with others. What if I miss a dose? It is important not to miss your dose. Call your doctor or health care professional if you are unable to keep an appointment. What may interact with this medication? This medicine may interact with the following medications: antiviral medicines for hepatitis, HIV or AIDS certain antibiotics like erythromycin and clarithromycin certain medicines for fungal infections like ketoconazole and itraconazole certain medicines for seizures like carbamazepine, phenobarbital, phenytoin gemfibrozil nefazodone rifampin St. John's wort This list may not describe all possible interactions. Give your health care provider a list of all the medicines, herbs, non-prescription drugs, or dietary supplements you use. Also tell them if you smoke, drink alcohol, or use illegal drugs. Some items may interact with your medicine. What should I watch for while using this medication? Your condition will be monitored carefully while you are receiving this medicine. You will need important blood work done while you are  taking this medicine. This medicine can cause serious allergic reactions. If you experience allergic reactions like skin rash, itching or hives, swelling of the face, lips, or tongue, tell your doctor or health care professional right away. In some cases, you may be given additional medicines to help with side effects. Follow all directions for their use. This drug may make you feel generally unwell. This is not uncommon, as chemotherapy can affect healthy cells as well as cancer cells. Report any side effects. Continue your course of treatment even though you feel ill unless your doctor tells you to stop. Call your doctor or health care professional for advice if you get a fever, chills or sore throat, or other symptoms of a cold or flu. Do not treat yourself. This drug decreases your body's ability to fight infections. Try to avoid being around people who are sick. This medicine may increase your risk to bruise or bleed. Call your doctor or health care professional if you notice any unusual bleeding. Be careful brushing and flossing your teeth or using a toothpick because you may get an infection or bleed more easily. If you have any dental work done, tell your dentist you are receiving this medicine. Avoid taking products that contain aspirin, acetaminophen, ibuprofen, naproxen, or ketoprofen unless instructed by your doctor. These medicines may hide a fever. Do not become pregnant while taking this medicine or for 6 months after stopping it.  Women should inform their doctor if they wish to become pregnant or think they might be pregnant. Men should not father a child while taking this medicine or for 3 months after stopping it. There is a potential for serious side effects to an unborn child. Talk to your health care professional or pharmacist for more information. Do not breast-feed an infant while taking this medicine or for 2 weeks after stopping it. This medicine may interfere with the ability to  get pregnant or to father a child. You should talk to your doctor or health care professional if you are concerned about your fertility. What side effects may I notice from receiving this medication? Side effects that you should report to your doctor or health care professional as soon as possible: allergic reactions like skin rash, itching or hives, swelling of the face, lips, or tongue breathing problems changes in vision fast, irregular heartbeat low blood pressure mouth sores pain, tingling, numbness in the hands or feet signs of decreased platelets or bleeding - bruising, pinpoint red spots on the skin, black, tarry stools, blood in the urine signs of decreased red blood cells - unusually weak or tired, feeling faint or lightheaded, falls signs of infection - fever or chills, cough, sore throat, pain or difficulty passing urine signs and symptoms of liver injury like dark yellow or brown urine; general ill feeling or flu-like symptoms; light-colored stools; loss of appetite; nausea; right upper belly pain; unusually weak or tired; yellowing of the eyes or skin swelling of the ankles, feet, hands unusually slow heartbeat Side effects that usually do not require medical attention (report to your doctor or health care professional if they continue or are bothersome): diarrhea hair loss loss of appetite nausea, vomiting tiredness This list may not describe all possible side effects. Call your doctor for medical advice about side effects. You may report side effects to FDA at 1-800-FDA-1088. Where should I keep my medication? This drug is given in a hospital or clinic and will not be stored at home. NOTE: This sheet is a summary. It may not cover all possible information. If you have questions about this medicine, talk to your doctor, pharmacist, or health care provider.  2022 Elsevier/Gold Standard (2017-01-28 00:00:00)  Gemcitabine injection What is this medication? GEMCITABINE (jem  SYE ta been) is a chemotherapy drug. This medicine is used to treat many types of cancer like breast cancer, lung cancer, pancreatic cancer, and ovarian cancer. This medicine may be used for other purposes; ask your health care provider or pharmacist if you have questions. COMMON BRAND NAME(S): Gemzar, Infugem What should I tell my care team before I take this medication? They need to know if you have any of these conditions: blood disorders infection kidney disease liver disease lung or breathing disease, like asthma recent or ongoing radiation therapy an unusual or allergic reaction to gemcitabine, other chemotherapy, other medicines, foods, dyes, or preservatives pregnant or trying to get pregnant breast-feeding How should I use this medication? This drug is given as an infusion into a vein. It is administered in a hospital or clinic by a specially trained health care professional. Talk to your pediatrician regarding the use of this medicine in children. Special care may be needed. Overdosage: If you think you have taken too much of this medicine contact a poison control center or emergency room at once. NOTE: This medicine is only for you. Do not share this medicine with others. What if I miss a dose? It is  important not to miss your dose. Call your doctor or health care professional if you are unable to keep an appointment. What may interact with this medication? medicines to increase blood counts like filgrastim, pegfilgrastim, sargramostim some other chemotherapy drugs like cisplatin vaccines Talk to your doctor or health care professional before taking any of these medicines: acetaminophen aspirin ibuprofen ketoprofen naproxen This list may not describe all possible interactions. Give your health care provider a list of all the medicines, herbs, non-prescription drugs, or dietary supplements you use. Also tell them if you smoke, drink alcohol, or use illegal drugs. Some items  may interact with your medicine. What should I watch for while using this medication? Visit your doctor for checks on your progress. This drug may make you feel generally unwell. This is not uncommon, as chemotherapy can affect healthy cells as well as cancer cells. Report any side effects. Continue your course of treatment even though you feel ill unless your doctor tells you to stop. In some cases, you may be given additional medicines to help with side effects. Follow all directions for their use. Call your doctor or health care professional for advice if you get a fever, chills or sore throat, or other symptoms of a cold or flu. Do not treat yourself. This drug decreases your body's ability to fight infections. Try to avoid being around people who are sick. This medicine may increase your risk to bruise or bleed. Call your doctor or health care professional if you notice any unusual bleeding. Be careful brushing and flossing your teeth or using a toothpick because you may get an infection or bleed more easily. If you have any dental work done, tell your dentist you are receiving this medicine. Avoid taking products that contain aspirin, acetaminophen, ibuprofen, naproxen, or ketoprofen unless instructed by your doctor. These medicines may hide a fever. Do not become pregnant while taking this medicine or for 6 months after stopping it. Women should inform their doctor if they wish to become pregnant or think they might be pregnant. Men should not father a child while taking this medicine and for 3 months after stopping it. There is a potential for serious side effects to an unborn child. Talk to your health care professional or pharmacist for more information. Do not breast-feed an infant while taking this medicine or for at least 1 week after stopping it. Men should inform their doctors if they wish to father a child. This medicine may lower sperm counts. Talk with your doctor or health care  professional if you are concerned about your fertility. What side effects may I notice from receiving this medication? Side effects that you should report to your doctor or health care professional as soon as possible: allergic reactions like skin rash, itching or hives, swelling of the face, lips, or tongue breathing problems pain, redness, or irritation at site where injected signs and symptoms of a dangerous change in heartbeat or heart rhythm like chest pain; dizziness; fast or irregular heartbeat; palpitations; feeling faint or lightheaded, falls; breathing problems signs of decreased platelets or bleeding - bruising, pinpoint red spots on the skin, black, tarry stools, blood in the urine signs of decreased red blood cells - unusually weak or tired, feeling faint or lightheaded, falls signs of infection - fever or chills, cough, sore throat, pain or difficulty passing urine signs and symptoms of kidney injury like trouble passing urine or change in the amount of urine signs and symptoms of liver injury like dark  yellow or brown urine; general ill feeling or flu-like symptoms; light-colored stools; loss of appetite; nausea; right upper belly pain; unusually weak or tired; yellowing of the eyes or skin swelling of ankles, feet, hands Side effects that usually do not require medical attention (report to your doctor or health care professional if they continue or are bothersome): constipation diarrhea hair loss loss of appetite nausea rash vomiting This list may not describe all possible side effects. Call your doctor for medical advice about side effects. You may report side effects to FDA at 1-800-FDA-1088. Where should I keep my medication? This drug is given in a hospital or clinic and will not be stored at home. NOTE: This sheet is a summary. It may not cover all possible information. If you have questions about this medicine, talk to your doctor, pharmacist, or health care provider.   2022 Elsevier/Gold Standard (2017-08-20 00:00:00)  Hypokalemia Hypokalemia means that the amount of potassium in the blood is lower than normal. Potassium is a chemical (electrolyte) that helps regulate the amount of fluid in the body. It also stimulates muscle tightening (contraction) and helps nerves work properly. Normally, most of the body's potassium is inside cells, and only a very small amount is in the blood. Because the amount in the blood is so small, minor changes to potassium levels in the blood can be life-threatening. What are the causes? This condition may be caused by: Antibiotic medicine. Diarrhea or vomiting. Taking too much of a medicine that helps you have a bowel movement (laxative) can cause diarrhea and lead to hypokalemia. Chronic kidney disease (CKD). Medicines that help the body get rid of excess fluid (diuretics). Eating disorders, such as bulimia. Low magnesium levels in the body. Sweating a lot. What are the signs or symptoms? Symptoms of this condition include: Weakness. Constipation. Fatigue. Muscle cramps. Mental confusion. Skipped heartbeats or irregular heartbeat (palpitations). Tingling or numbness. How is this diagnosed? This condition is diagnosed with a blood test. How is this treated? This condition may be treated by: Taking potassium supplements by mouth. Adjusting the medicines that you take. Eating more foods that contain a lot of potassium. If your potassium level is very low, you may need to get potassium through an IV and be monitored in the hospital. Follow these instructions at home:  Take over-the-counter and prescription medicines only as told by your health care provider. This includes vitamins and supplements. Eat a healthy diet. A healthy diet includes fresh fruits and vegetables, whole grains, healthy fats, and lean proteins. If instructed, eat more foods that contain a lot of potassium. This includes: Nuts, such as peanuts and  pistachios. Seeds, such as sunflower seeds and pumpkin seeds. Peas, lentils, and lima beans. Whole grain and bran cereals and breads. Fresh fruits and vegetables, such as apricots, avocado, bananas, cantaloupe, kiwi, oranges, tomatoes, asparagus, and potatoes. Orange juice. Tomato juice. Red meats. Yogurt. Keep all follow-up visits as told by your health care provider. This is important. Contact a health care provider if you: Have weakness that gets worse. Feel your heart pounding or racing. Vomit. Have diarrhea. Have diabetes (diabetes mellitus) and you have trouble keeping your blood sugar (glucose) in your target range. Get help right away if you: Have chest pain. Have shortness of breath. Have vomiting or diarrhea that lasts for more than 2 days. Faint. Summary Hypokalemia means that the amount of potassium in the blood is lower than normal. This condition is diagnosed with a blood test. Hypokalemia may be treated  by taking potassium supplements, adjusting the medicines that you take, or eating more foods that are high in potassium. If your potassium level is very low, you may need to get potassium through an IV and be monitored in the hospital. This information is not intended to replace advice given to you by your health care provider. Make sure you discuss any questions you have with your health care provider. Document Revised: 01/06/2018 Document Reviewed: 01/07/2018 Elsevier Patient Education  2022 [place].  Hypomagnesemia Hypomagnesemia is a condition in which the level of magnesium in the blood is too low. Magnesium is a mineral that is found in many foods. It is used in many different processes in the body. Hypomagnesemia can affect every organ in the body. In severe cases, it can cause life-threatening problems. What are the causes? This condition may be caused by: Not getting enough magnesium in your diet or not having enough healthy foods to eat  (malnutrition). Problems with magnesium absorption in the intestines. Dehydration. Excessive use of alcohol. Vomiting. Severe or long-term (chronic) diarrhea. Some medicines, including medicines that make you urinate more often (diuretics). Certain diseases, such as kidney disease, diabetes, celiac disease, and overactive thyroid. What are the signs or symptoms? Symptoms of this condition include: Loss of appetite, nausea, and vomiting. Involuntary shaking or trembling of a body part (tremor). Muscle weakness or tingling in the arms and legs. Sudden tightening of muscles (muscle spasms). Confusion. Psychiatric issues, such as: Depression and irritability. Psychosis. A feeling of fluttering of the heart (palpitations). Seizures. These symptoms are more severe if magnesium levels drop suddenly. How is this diagnosed? This condition may be diagnosed based on: Your symptoms and medical history. A physical exam. Blood and urine tests. How is this treated? Treatment depends on the cause and the severity of the condition. It may be treated by: Taking a magnesium supplement. This can be taken in pill form. If the condition is severe, magnesium is usually given through an IV. Making changes to your diet. You may be directed to eat foods that have a lot of magnesium, such as green leafy vegetables, peas, beans, and nuts. Not drinking alcohol. If you are struggling not to drink, ask your health care provider for help. Follow these instructions at home: Eating and drinking   Make sure that your diet includes foods with magnesium. Foods that have a lot of magnesium in them include: Green leafy vegetables, such as spinach and broccoli. Beans and peas. Nuts and seeds, such as almonds and sunflower seeds. Whole grains, such as whole grain bread and fortified cereals. Drink fluids that contain salts and minerals (electrolytes), such as sports drinks, when you are active. Do not drink  alcohol. General instructions Take over-the-counter and prescription medicines only as told by your health care provider. Take magnesium supplements as directed if your health care provider tells you to take them. Have your magnesium levels monitored as told by your health care provider. Keep all follow-up visits. This is important. Contact a health care provider if: You get worse instead of better. Your symptoms return. Get help right away if: You develop severe muscle weakness. You have trouble breathing. You feel that your heart is racing. These symptoms may represent a serious problem that is an emergency. Do not wait to see if the symptoms will go away. Get medical help right away. Call your local emergency services (911 in the [place] Do not drive yourself to the hospital. Summary Hypomagnesemia is a condition in which the  level of magnesium in the blood is too low. Hypomagnesemia can affect every organ in the body. Treatment may include eating more foods that contain magnesium, taking magnesium supplements, and not drinking alcohol. Have your magnesium levels monitored as told by your health care provider. This information is not intended to replace advice given to you by your health care provider. Make sure you discuss any questions you have with your health care provider. Document Revised: 10/24/2020 Document Reviewed: 10/24/2020 Elsevier Patient Education  [place].

## 2021-07-09 ENCOUNTER — Encounter: Payer: Self-pay | Admitting: Internal Medicine

## 2021-07-09 DIAGNOSIS — N6082 Other benign mammary dysplasias of left breast: Secondary | ICD-10-CM | POA: Diagnosis not present

## 2021-07-09 LAB — HM MAMMOGRAPHY

## 2021-07-14 ENCOUNTER — Other Ambulatory Visit: Payer: Self-pay | Admitting: Oncology

## 2021-07-18 ENCOUNTER — Inpatient Hospital Stay: Payer: BC Managed Care – PPO | Attending: Genetic Counselor

## 2021-07-18 ENCOUNTER — Other Ambulatory Visit: Payer: Self-pay | Admitting: *Deleted

## 2021-07-18 ENCOUNTER — Other Ambulatory Visit: Payer: Self-pay

## 2021-07-18 ENCOUNTER — Encounter: Payer: Self-pay | Admitting: *Deleted

## 2021-07-18 ENCOUNTER — Telehealth: Payer: Self-pay

## 2021-07-18 ENCOUNTER — Encounter: Payer: Self-pay | Admitting: Nurse Practitioner

## 2021-07-18 ENCOUNTER — Inpatient Hospital Stay (HOSPITAL_BASED_OUTPATIENT_CLINIC_OR_DEPARTMENT_OTHER): Payer: BC Managed Care – PPO | Admitting: Nurse Practitioner

## 2021-07-18 VITALS — BP 119/77 | HR 84 | Temp 98.1°F | Resp 18 | Ht 68.0 in | Wt 181.2 lb

## 2021-07-18 DIAGNOSIS — C250 Malignant neoplasm of head of pancreas: Secondary | ICD-10-CM | POA: Insufficient documentation

## 2021-07-18 DIAGNOSIS — Z923 Personal history of irradiation: Secondary | ICD-10-CM | POA: Insufficient documentation

## 2021-07-18 DIAGNOSIS — R918 Other nonspecific abnormal finding of lung field: Secondary | ICD-10-CM | POA: Insufficient documentation

## 2021-07-18 DIAGNOSIS — E876 Hypokalemia: Secondary | ICD-10-CM

## 2021-07-18 DIAGNOSIS — Z79899 Other long term (current) drug therapy: Secondary | ICD-10-CM | POA: Diagnosis not present

## 2021-07-18 DIAGNOSIS — D3A8 Other benign neuroendocrine tumors: Secondary | ICD-10-CM | POA: Diagnosis not present

## 2021-07-18 DIAGNOSIS — Z5111 Encounter for antineoplastic chemotherapy: Secondary | ICD-10-CM | POA: Insufficient documentation

## 2021-07-18 LAB — CMP (CANCER CENTER ONLY)
ALT: 35 U/L (ref 0–44)
AST: 24 U/L (ref 15–41)
Albumin: 2.7 g/dL — ABNORMAL LOW (ref 3.5–5.0)
Alkaline Phosphatase: 310 U/L — ABNORMAL HIGH (ref 38–126)
Anion gap: 6 (ref 5–15)
BUN: 8 mg/dL (ref 6–20)
CO2: 27 mmol/L (ref 22–32)
Calcium: 8.2 mg/dL — ABNORMAL LOW (ref 8.9–10.3)
Chloride: 105 mmol/L (ref 98–111)
Creatinine: 1.11 mg/dL — ABNORMAL HIGH (ref 0.44–1.00)
GFR, Estimated: 59 mL/min — ABNORMAL LOW (ref >60)
Glucose, Bld: 194 mg/dL — ABNORMAL HIGH (ref 70–99)
Potassium: 3.2 mmol/L — ABNORMAL LOW (ref 3.5–5.1)
Sodium: 138 mmol/L (ref 135–145)
Total Bilirubin: 0.3 mg/dL (ref 0.3–1.2)
Total Protein: 6.2 g/dL — ABNORMAL LOW (ref 6.5–8.1)

## 2021-07-18 LAB — CBC WITH DIFFERENTIAL (CANCER CENTER ONLY)
Abs Immature Granulocytes: 0.02 10*3/uL (ref 0.00–0.07)
Basophils Absolute: 0 10*3/uL (ref 0.0–0.1)
Basophils Relative: 1 %
Eosinophils Absolute: 0.2 10*3/uL (ref 0.0–0.5)
Eosinophils Relative: 4 %
HCT: 26.1 % — ABNORMAL LOW (ref 36.0–46.0)
Hemoglobin: 7.6 g/dL — ABNORMAL LOW (ref 12.0–15.0)
Immature Granulocytes: 0 %
Lymphocytes Relative: 28 %
Lymphs Abs: 1.5 10*3/uL (ref 0.7–4.0)
MCH: 26.7 pg (ref 26.0–34.0)
MCHC: 29.1 g/dL — ABNORMAL LOW (ref 30.0–36.0)
MCV: 91.6 fL (ref 80.0–100.0)
Monocytes Absolute: 0.7 10*3/uL (ref 0.1–1.0)
Monocytes Relative: 12 %
Neutro Abs: 3.1 10*3/uL (ref 1.7–7.7)
Neutrophils Relative %: 55 %
Platelet Count: 433 10*3/uL — ABNORMAL HIGH (ref 150–400)
RBC: 2.85 MIL/uL — ABNORMAL LOW (ref 3.87–5.11)
RDW: 17 % — ABNORMAL HIGH (ref 11.5–15.5)
WBC Count: 5.6 10*3/uL (ref 4.0–10.5)
nRBC: 0 % (ref 0.0–0.2)

## 2021-07-18 LAB — MAGNESIUM: Magnesium: 1.7 mg/dL (ref 1.7–2.4)

## 2021-07-18 LAB — SAMPLE TO BLOOD BANK

## 2021-07-18 LAB — PREPARE RBC (CROSSMATCH)

## 2021-07-18 NOTE — Progress Notes (Signed)
Orders placed for IV K+/Mg+ on 07/19/21 per Dr. Benay Spice order.

## 2021-07-18 NOTE — Telephone Encounter (Signed)
Per Alyssa Ross. [name] only 1 unit of blood

## 2021-07-18 NOTE — Progress Notes (Signed)
[place] OFFICE PROGRESS NOTE   Diagnosis:  Pancreas cancer  INTERVAL HISTORY:   Alyssa Ross returns as scheduled.  She completed another cycle of gemcitabine/Abraxane 07/05/2021.  She denies nausea/vomiting.  No mouth sores.  No diarrhea.  Energy level is stable.  Mild dyspnea on exertion.  She denies bleeding.  She noted numbness in the fingertips for about 4 days after the most recent chemotherapy.  This mainly occurred when the hands were exposed to cold.  Stable to mildly increased neuropathy symptoms in the feet.  Objective:  Vital signs in last 24 hours:  Blood pressure 119/77, pulse 84, temperature 98.1 F (36.7 C), temperature source Oral, resp. rate 18, height _0  (1.727 m), weight 181 lb 3.2 oz (82.2 kg), last menstrual period 09/17/2012, SpO2 100 %.    HEENT: No thrush or ulcers. Resp: Lungs clear bilaterally. Cardio: Regular rate and rhythm. GI: Abdomen soft and nontender.  No hepatomegaly. Vascular: Trace bilateral lower leg/ankle edema. Neuro: Vibratory sense minimally decreased over the fingertips per tuning fork exam. Skin: Palms with scattered areas of hyperpigmentation. Port-A-Cath without erythema.  Lab Results:  Lab Results  Component Value Date   WBC 5.6 07/18/2021   HGB 7.6 (L) 07/18/2021   HCT 26.1 (L) 07/18/2021   MCV 91.6 07/18/2021   PLT 433 (H) 07/18/2021   NEUTROABS 3.1 07/18/2021    Imaging:  No results found.  Medications: I have reviewed the patient's current medications.  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 Tumor invades the duodenal wall and vascular groove, resection margins negative, 4/34 lymph nodes positive MSI-stable, tumor showed instability in 2 loci as did adjacent normal tissue EUS FNA biopsy of pancreas mass on 07/03/2018-well-differentiated neuroendocrine tumor CTs 01/29/2019-ill-defined pancreas head mass, 5 pulmonary nodules-1 with a small  amount of central cavitation, tumor abuts the left margin of the portal vein indistinct density surrounding, hepatic artery, complex cystic lesion of the right kidney, right adrenal mass-characterized as an adenoma on a [facility] MRI 12/21/2018 [facility] 03/03/2019-no focal pancreas activity, no tracer accumulation within the suspicious pulmonary nodules, left uterine mass with tracer accumulation felt to represent a leiomyoma Elevated preoperative CA 19-9--CA 19-9 186 on 01/18/2019 CT chest 04/16/2019-multiple bilateral pulmonary nodules, some with increased cavitation, stable in size Cycle 1 FOLFIRINOX 04/27/2019 Cycle 2 FOLFIRINOX 05/11/2019 Cycle 3 FOLFIRINOX 05/23/2019 Cycle 4 FOLFIRINOX 06/08/2019 Cycle 5 FOLFIRINOX 06/22/2019 CT chest 07/02/2019-stable size of bilateral pulmonary nodules.  Dominant cavitary lesions in both lungs show increased cavitation with thinner walls.  Stable 2.1 cm right adrenal nodule. 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 No definite evidence of metastatic disease in the abdomen or pelvis. Markedly enlarged and heterogeneous appearing uterus, likely to represent multifocal fibroids. 1 of these lesions appears to be an exophytic subserosal fibroid in the posterior lateral aspect of the uterine body on the left side although this comes in close proximity to the left adnexa such that a primary ovarian lesion is difficult to completely exclude. CTs 02/07/2020-slight enlargement of bilateral lung nodules, some are cavitary, no evidence of metastatic disease in the  abdomen or pelvis, stable right adrenal nodule, uterine fibroids CTs 04/26/2020-mild enlargement  of pulmonary nodules, slight increase in trace pelvic fluid, new soft tissue thickening inferior to the cecal tip suspicious for peritoneal metastasis CT 05/26/2020-improved appearance of soft tissue at the inferior tip of the cecum, mildly thickened short appendix-findings suggestive of resolving appendicitis, stable small bibasilar pulmonary nodules, fibroids Plan biopsy of right cecal tip soft tissue canceled secondary to radiologic improvement CT chest 08/02/2020-enlargement and progressive cavitation of multiple bilateral lung nodules.  Some new nodules are present. CTs 10/24/2020- increase in size of pulmonary nodules, no new nodules, no evidence of metastatic disease in the abdomen, stable right adrenal nodule CT 01/09/2021-slight interval enlargement of pulmonary nodules, stable right adrenal nodule Navigation bronchoscopy 01/30/2021-left lower lobe cavitary nodule FNA-adenocarcinoma, brushing-adenocarcinoma.  Left lower lobe lavage-adenocarcinoma.  Right upper lobe brushing and FNA biopsy of cavitary nodule-adenocarcinoma-immunohistochemical profile consistent with pancreas adenocarcinoma Cycle 1 gemcitabine/Abraxane 03/28/2021 Cycle 2 gemcitabine/Abraxane 04/11/2021 Cycle 3 gemcitabine/Abraxane 04/25/2021 Cycle 4 gemcitabine/Abraxane 05/09/2021 Cycle 5 gemcitabine/Abraxane 05/23/2021 CT chest 06/05/2021-interval cavitation of multiple pulmonary nodules, some nodules have decreased in size, no new or enlarging nodules Cycle 6 gemcitabine/Abraxane 06/06/2021 Cycle 7 gemcitabine/Abraxane 06/21/2021 Cycle 8 gemcitabine/Abraxane 07/05/2021 Cycle 9 Gemcitabine/Abraxane 07/19/2021   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 Barry Dienes, 04/21/2019 Oxaliplatin neuropathy-progressive  08/03/2019, improved 02/08/2020 Mild lower abdominal pain after exercise, likely MSK related (04/04/20) Left breast mass January 22- 5 mm hypoechoic lesion at the 1 o'clock position of the left breast, biopsy- fibroadenomatoid change      Disposition: Alyssa Ross appears stable.  She has completed 8 cycles of gemcitabine/Abraxane.  Plan to proceed with cycle 9 as scheduled 07/19/2021.  CBC and chemistry panel from today reviewed.  Labs adequate to proceed with treatment.  She has progressive anemia, mildly symptomatic.  We decided to go ahead with a blood transfusion later this week.  She has persistent hypokalemia and hypomagnesia.  She continues oral supplementation.  Plan for IV replacement 07/19/2021  She will return for an office visit in 2 weeks.  She will contact the office in the interim with any problems.    Ned Card ANP/GNP-BC   07/18/2021  8:16 AM

## 2021-07-18 NOTE — Progress Notes (Signed)
Patient seen by Ned Card NP today  Vitals are within treatment parameters.  Labs reviewed by Ned Card NP and are within treatment parameters. K+=3.2. Will receive IV K+ and Mg+ on 2/09 Will transfuse 1 unit of blood at [facility] on 07/20/21  Per physician team, patient is ready for treatment. Please note that modifications are being made to the treatment plan including Addition of IV KCL and Mg+--order placed under sign and hold.

## 2021-07-19 ENCOUNTER — Other Ambulatory Visit: Payer: Self-pay | Admitting: Nurse Practitioner

## 2021-07-19 ENCOUNTER — Inpatient Hospital Stay: Payer: BC Managed Care – PPO

## 2021-07-19 ENCOUNTER — Ambulatory Visit: Payer: BC Managed Care – PPO

## 2021-07-19 VITALS — BP 109/74 | HR 95 | Temp 97.9°F | Resp 16

## 2021-07-19 DIAGNOSIS — Z5111 Encounter for antineoplastic chemotherapy: Secondary | ICD-10-CM | POA: Diagnosis not present

## 2021-07-19 DIAGNOSIS — C250 Malignant neoplasm of head of pancreas: Secondary | ICD-10-CM | POA: Diagnosis not present

## 2021-07-19 DIAGNOSIS — E876 Hypokalemia: Secondary | ICD-10-CM

## 2021-07-19 DIAGNOSIS — Z923 Personal history of irradiation: Secondary | ICD-10-CM | POA: Diagnosis not present

## 2021-07-19 DIAGNOSIS — R918 Other nonspecific abnormal finding of lung field: Secondary | ICD-10-CM | POA: Diagnosis not present

## 2021-07-19 DIAGNOSIS — Z79899 Other long term (current) drug therapy: Secondary | ICD-10-CM | POA: Diagnosis not present

## 2021-07-19 LAB — CANCER ANTIGEN 19-9: CA 19-9: 27 U/mL (ref 0–35)

## 2021-07-19 MED ORDER — SODIUM CHLORIDE 0.9% FLUSH
10.0000 mL | INTRAVENOUS | Status: DC | PRN
  Administered 2021-07-19: 10 mL

## 2021-07-19 MED ORDER — PACLITAXEL PROTEIN-BOUND CHEMO INJECTION 100 MG
100.0000 mg/m2 | Freq: Once | INTRAVENOUS | Status: AC
  Administered 2021-07-19: 200 mg via INTRAVENOUS
  Filled 2021-07-19: qty 40

## 2021-07-19 MED ORDER — SODIUM CHLORIDE 0.9 % IV SOLN
1000.0000 mg/m2 | Freq: Once | INTRAVENOUS | Status: AC
  Administered 2021-07-19: 1976 mg via INTRAVENOUS
  Filled 2021-07-19: qty 51.97

## 2021-07-19 MED ORDER — SODIUM CHLORIDE 0.9 % IV SOLN: Freq: Once | INTRAVENOUS | Status: AC

## 2021-07-19 MED ORDER — MAGNESIUM SULFATE 2 GM/50ML IV SOLN
2.0000 g | Freq: Once | INTRAVENOUS | Status: AC
  Administered 2021-07-19: 2 g via INTRAVENOUS
  Filled 2021-07-19: qty 50

## 2021-07-19 MED ORDER — POTASSIUM CHLORIDE 10 MEQ/100ML IV SOLN
10.0000 meq | INTRAVENOUS | Status: AC
  Administered 2021-07-19 (×2): 10 meq via INTRAVENOUS
  Filled 2021-07-19 (×2): qty 100

## 2021-07-19 MED ORDER — HEPARIN SOD (PORK) LOCK FLUSH 100 UNIT/ML IV SOLN
500.0000 [IU] | Freq: Once | INTRAVENOUS | Status: AC | PRN
  Administered 2021-07-19: 500 [IU]

## 2021-07-19 MED ORDER — PROCHLORPERAZINE MALEATE 10 MG PO TABS
10.0000 mg | ORAL_TABLET | Freq: Once | ORAL | Status: AC
  Administered 2021-07-19: 10 mg via ORAL
  Filled 2021-07-19: qty 1

## 2021-07-19 NOTE — Progress Notes (Signed)
Pt requested to keep her PAC accessed for her blood transfusion tomorrow (07/20/21).  A biodisc and at home dressing were applied when [facility] was accessed today (07/19/21).

## 2021-07-19 NOTE — Patient Instructions (Signed)
[facility] ONCOLOGY  Discharge Instructions: Thank you for choosing [facility] to provide your oncology and hematology care.   If you have a lab appointment with the [facility], please go directly to the [facility] and check in at the registration area.   Wear comfortable clothing and clothing appropriate for easy access to any Portacath or PICC line.   We strive to give you quality time with your provider. You may need to reschedule your appointment if you arrive late (15 or more minutes).  Arriving late affects you and other patients whose appointments are after yours.  Also, if you miss three or more appointments without notifying the office, you may be dismissed from the clinic at the providers discretion.      For prescription refill requests, have your pharmacy contact our office and allow 72 hours for refills to be completed.    Today you received the following chemotherapy and/or immunotherapy agents: Abraxane/Gemzar    To help prevent nausea and vomiting after your treatment, we encourage you to take your nausea medication as directed.  BELOW ARE SYMPTOMS THAT SHOULD BE REPORTED IMMEDIATELY: *FEVER GREATER THAN 100.4 F (38 C) OR HIGHER *CHILLS OR SWEATING *NAUSEA AND VOMITING THAT IS NOT CONTROLLED WITH YOUR NAUSEA MEDICATION *UNUSUAL SHORTNESS OF BREATH *UNUSUAL BRUISING OR BLEEDING *URINARY PROBLEMS (pain or burning when urinating, or frequent urination) *BOWEL PROBLEMS (unusual diarrhea, constipation, pain near the anus) TENDERNESS IN MOUTH AND THROAT WITH OR WITHOUT PRESENCE OF ULCERS (sore throat, sores in mouth, or a toothache) UNUSUAL RASH, SWELLING OR PAIN  UNUSUAL VAGINAL DISCHARGE OR ITCHING   Items with * indicate a potential emergency and should be followed up as soon as possible or go to the Emergency Department if any problems should occur.  Please show the CHEMOTHERAPY ALERT CARD or IMMUNOTHERAPY ALERT CARD at check-in  to the Emergency Department and triage nurse.  Should you have questions after your visit or need to cancel or reschedule your appointment, please contact [facility]  Dept: [phone]  and follow the prompts.  Office hours are 8:00 a.m. to 4:30 p.m. Monday - Friday. Please note that voicemails left after 4:00 p.m. may not be returned until the following business day.  We are closed weekends and major holidays. You have access to a nurse at all times for urgent questions. Please call the main number to the clinic Dept: [phone] and follow the prompts.   For any non-urgent questions, you may also contact your provider using MyChart. We now offer e-Visits for anyone 38 and older to request care online for non-urgent symptoms. For details visit [email or web address].   Also download the MyChart app! Go to the app store, search "MyChart", open the app, select [facility], and log in with your MyChart username and password.  Due to Covid, a mask is required upon entering the hospital/clinic. If you do not have a mask, one will be given to you upon arrival. For doctor visits, patients may have 1 support person aged 43 or older with them. For treatment visits, patients cannot have anyone with them due to current Covid guidelines and our immunocompromised population.   Magnesium Sulfate Injection What is this medication? MAGNESIUM SULFATE (mag NEE zee um SUL fate) prevents and treats low levels of magnesium in your body. It may also be used to prevent and treat seizures during pregnancy in people with high blood pressure disorders, such as preeclampsia or eclampsia. Magnesium plays  an important role in maintaining the health of your muscles and nervous system. This medicine may be used for other purposes; ask your health care provider or pharmacist if you have questions. What should I tell my care team before I take this medication? They need to know if you have any of  these conditions: Heart disease History of irregular heart beat Kidney disease An unusual or allergic reaction to magnesium sulfate, medications, foods, dyes, or preservatives Pregnant or trying to get pregnant Breast-feeding How should I use this medication? This medication is for infusion into a vein. It is given in a hospital or clinic setting. Talk to your care team about the use of this medication in children. While this medication may be prescribed for selected conditions, precautions do apply. Overdosage: If you think you have taken too much of this medicine contact a poison control center or emergency room at once. NOTE: This medicine is only for you. Do not share this medicine with others. What if I miss a dose? This does not apply. What may interact with this medication? Certain medications for anxiety or sleep Certain medications for seizures like phenobarbital Digoxin Medications that relax muscles for surgery Narcotic medications for pain This list may not describe all possible interactions. Give your health care provider a list of all the medicines, herbs, non-prescription drugs, or dietary supplements you use. Also tell them if you smoke, drink alcohol, or use illegal drugs. Some items may interact with your medicine. What should I watch for while using this medication? Your condition will be monitored carefully while you are receiving this medication. You may need blood work done while you are receiving this medication. What side effects may I notice from receiving this medication? Side effects that you should report to your care team as soon as possible: Allergic reactions--skin rash, itching, hives, swelling of the face, lips, tongue, or throat High magnesium level--confusion, drowsiness, facial flushing, redness, sweating, muscle weakness, fast or irregular heartbeat, trouble breathing Low blood pressure--dizziness, feeling faint or lightheaded, blurry vision Side  effects that usually do not require medical attention (report to your care team if they continue or are bothersome): Headache Nausea This list may not describe all possible side effects. Call your doctor for medical advice about side effects. You may report side effects to FDA at 1-800-FDA-1088. Where should I keep my medication? This medication is given in a hospital or clinic and will not be stored at home. NOTE: This sheet is a summary. It may not cover all possible information. If you have questions about this medicine, talk to your doctor, pharmacist, or health care provider.  2022 Elsevier/Gold Standard (2020-08-10 00:00:00)  Potassium Chloride Injection What is this medication? POTASSIUM CHLORIDE (poe TASS i um KLOOR ide) prevents and treats low levels of potassium in your body. Potassium plays an important role in maintaining the health of your kidneys, heart, muscles, and nervous system. This medicine may be used for other purposes; ask your health care provider or pharmacist if you have questions. COMMON BRAND NAME(S): PROAMP What should I tell my care team before I take this medication? They need to know if you have any of these conditions: Addison disease Dehydration Diabetes (high blood sugar) Heart disease High levels of potassium in the blood Irregular heartbeat or rhythm Kidney disease Large areas of burned skin An unusual or allergic reaction to potassium, other medications, foods, dyes, or preservatives Pregnant or trying to get pregnant Breast-feeding How should I use this medication? This medication  is injected into a vein. It is given in a hospital or clinic setting. Talk to your care team about the use of this medication in children. Special care may be needed. Overdosage: If you think you have taken too much of this medicine contact a poison control center or emergency room at once. NOTE: This medicine is only for you. Do not share this medicine with  others. What if I miss a dose? This does not apply. This medication is not for regular use. What may interact with this medication? Do not take this medication with any of the following: Certain diuretics such as spironolactone, triamterene Eplerenone Sodium polystyrene sulfonate This medication may also interact with the following: Certain medications for blood pressure or heart disease like lisinopril, losartan, quinapril, valsartan Medications that lower your chance of fighting infection such as cyclosporine, tacrolimus NSAIDs, medications for pain and inflammation, like ibuprofen or naproxen Other potassium supplements Salt substitutes This list may not describe all possible interactions. Give your health care provider a list of all the medicines, herbs, non-prescription drugs, or dietary supplements you use. Also tell them if you smoke, drink alcohol, or use illegal drugs. Some items may interact with your medicine. What should I watch for while using this medication? Visit your care team for regular checks on your progress. Tell your care team if your symptoms do not start to get better or if they get worse. You may need blood work while you are taking this medication. Avoid salt substitutes unless you are told otherwise by your care team. What side effects may I notice from receiving this medication? Side effects that you should report to your care team as soon as possible: Allergic reactions--skin rash, itching, hives, swelling of the face, lips, tongue, or throat High potassium level--muscle weakness, fast or irregular heartbeat Side effects that usually do not require medical attention (report to your care team if they continue or are bothersome): Diarrhea Nausea Stomach pain Vomiting This list may not describe all possible side effects. Call your doctor for medical advice about side effects. You may report side effects to FDA at 1-800-FDA-1088. Where should I keep my  medication? This medication is given in a hospital or clinic. It will not be stored at home. NOTE: This sheet is a summary. It may not cover all possible information. If you have questions about this medicine, talk to your doctor, pharmacist, or health care provider.  2022 Elsevier/Gold Standard (2020-09-12 00:00:00)

## 2021-07-20 ENCOUNTER — Inpatient Hospital Stay: Payer: BC Managed Care – PPO

## 2021-07-20 ENCOUNTER — Other Ambulatory Visit: Payer: Self-pay

## 2021-07-20 DIAGNOSIS — C250 Malignant neoplasm of head of pancreas: Secondary | ICD-10-CM | POA: Diagnosis not present

## 2021-07-20 DIAGNOSIS — Z5111 Encounter for antineoplastic chemotherapy: Secondary | ICD-10-CM | POA: Diagnosis not present

## 2021-07-20 DIAGNOSIS — Z923 Personal history of irradiation: Secondary | ICD-10-CM | POA: Diagnosis not present

## 2021-07-20 DIAGNOSIS — Z79899 Other long term (current) drug therapy: Secondary | ICD-10-CM | POA: Diagnosis not present

## 2021-07-20 DIAGNOSIS — R918 Other nonspecific abnormal finding of lung field: Secondary | ICD-10-CM | POA: Diagnosis not present

## 2021-07-20 MED ORDER — SODIUM CHLORIDE 0.9% IV SOLUTION
250.0000 mL | Freq: Once | INTRAVENOUS | Status: AC
  Administered 2021-07-20: 250 mL via INTRAVENOUS

## 2021-07-20 MED ORDER — HEPARIN SOD (PORK) LOCK FLUSH 100 UNIT/ML IV SOLN
500.0000 [IU] | Freq: Every day | INTRAVENOUS | Status: AC | PRN
  Administered 2021-07-20: 500 [IU]

## 2021-07-20 MED ORDER — SODIUM CHLORIDE 0.9% FLUSH
10.0000 mL | INTRAVENOUS | Status: AC | PRN
  Administered 2021-07-20: 10 mL

## 2021-07-20 NOTE — Patient Instructions (Signed)
[facility]   Discharge Instructions: Thank you for choosing [facility] to provide your oncology and hematology care.   If you have a lab appointment with the [facility], please go directly to the [facility] and check in at the registration area.   Wear comfortable clothing and clothing appropriate for easy access to any Portacath or PICC line.   We strive to give you quality time with your provider. You may need to reschedule your appointment if you arrive late (15 or more minutes).  Arriving late affects you and other patients whose appointments are after yours.  Also, if you miss three or more appointments without notifying the office, you may be dismissed from the clinic at the providers discretion.      For prescription refill requests, have your pharmacy contact our office and allow 72 hours for refills to be completed.    Today you received the following:       To help prevent nausea and vomiting after your treatment, we encourage you to take your nausea medication as directed.  BELOW ARE SYMPTOMS THAT SHOULD BE REPORTED IMMEDIATELY: *FEVER GREATER THAN 100.4 F (38 C) OR HIGHER *CHILLS OR SWEATING *NAUSEA AND VOMITING THAT IS NOT CONTROLLED WITH YOUR NAUSEA MEDICATION *UNUSUAL SHORTNESS OF BREATH *UNUSUAL BRUISING OR BLEEDING *URINARY PROBLEMS (pain or burning when urinating, or frequent urination) *BOWEL PROBLEMS (unusual diarrhea, constipation, pain near the anus) TENDERNESS IN MOUTH AND THROAT WITH OR WITHOUT PRESENCE OF ULCERS (sore throat, sores in mouth, or a toothache) UNUSUAL RASH, SWELLING OR PAIN  UNUSUAL VAGINAL DISCHARGE OR ITCHING   Items with * indicate a potential emergency and should be followed up as soon as possible or go to the Emergency Department if any problems should occur.  Please show the CHEMOTHERAPY ALERT CARD or IMMUNOTHERAPY ALERT CARD at check-in to the Emergency Department and triage  nurse.  Should you have questions after your visit or need to cancel or reschedule your appointment, please contact [facility]  Dept: [phone]  and follow the prompts.  Office hours are 8:00 a.m. to 4:30 p.m. Monday - Friday. Please note that voicemails left after 4:00 p.m. may not be returned until the following business day.  We are closed weekends and major holidays. You have access to a nurse at all times for urgent questions. Please call the main number to the clinic Dept: [phone] and follow the prompts.   For any non-urgent questions, you may also contact your provider using MyChart. We now offer e-Visits for anyone 25 and older to request care online for non-urgent symptoms. For details visit [email or web address].   Also download the MyChart app! Go to the app store, search "MyChart", open the app, select [facility], and log in with your MyChart username and password.  Due to Covid, a mask is required upon entering the hospital/clinic. If you do not have a mask, one will be given to you upon arrival. For doctor visits, patients may have 1 support person aged 78 or older with them. For treatment visits, patients cannot have anyone with them due to current Covid guidelines and our immunocompromised population.

## 2021-07-23 LAB — TYPE AND SCREEN
ABO/RH(D): AB POS
Antibody Screen: NEGATIVE
Unit division: 0
Unit division: 0

## 2021-07-23 LAB — BPAM RBC
Blood Product Expiration Date: 202303012359
Blood Product Expiration Date: 202303012359
ISSUE DATE / TIME: 202302100730
ISSUE DATE / TIME: 202302110840
Unit Type and Rh: 6200
Unit Type and Rh: 6200

## 2021-07-28 ENCOUNTER — Other Ambulatory Visit: Payer: Self-pay | Admitting: Oncology

## 2021-08-01 ENCOUNTER — Other Ambulatory Visit: Payer: Self-pay

## 2021-08-01 ENCOUNTER — Inpatient Hospital Stay: Payer: BC Managed Care – PPO

## 2021-08-01 ENCOUNTER — Encounter: Payer: Self-pay | Admitting: *Deleted

## 2021-08-01 ENCOUNTER — Other Ambulatory Visit: Payer: Self-pay | Admitting: *Deleted

## 2021-08-01 ENCOUNTER — Inpatient Hospital Stay: Payer: BC Managed Care – PPO | Admitting: Oncology

## 2021-08-01 VITALS — BP 116/85 | HR 71 | Temp 97.8°F | Resp 18 | Ht 68.0 in | Wt 183.4 lb

## 2021-08-01 DIAGNOSIS — Z79899 Other long term (current) drug therapy: Secondary | ICD-10-CM | POA: Diagnosis not present

## 2021-08-01 DIAGNOSIS — Z5111 Encounter for antineoplastic chemotherapy: Secondary | ICD-10-CM | POA: Diagnosis not present

## 2021-08-01 DIAGNOSIS — C250 Malignant neoplasm of head of pancreas: Secondary | ICD-10-CM | POA: Diagnosis not present

## 2021-08-01 DIAGNOSIS — D3A8 Other benign neuroendocrine tumors: Secondary | ICD-10-CM

## 2021-08-01 DIAGNOSIS — E876 Hypokalemia: Secondary | ICD-10-CM

## 2021-08-01 DIAGNOSIS — R918 Other nonspecific abnormal finding of lung field: Secondary | ICD-10-CM | POA: Diagnosis not present

## 2021-08-01 DIAGNOSIS — Z923 Personal history of irradiation: Secondary | ICD-10-CM | POA: Diagnosis not present

## 2021-08-01 LAB — CBC WITH DIFFERENTIAL (CANCER CENTER ONLY)
Abs Immature Granulocytes: 0.02 10*3/uL (ref 0.00–0.07)
Basophils Absolute: 0 10*3/uL (ref 0.0–0.1)
Basophils Relative: 1 %
Eosinophils Absolute: 0.1 10*3/uL (ref 0.0–0.5)
Eosinophils Relative: 2 %
HCT: 28.5 % — ABNORMAL LOW (ref 36.0–46.0)
Hemoglobin: 8.5 g/dL — ABNORMAL LOW (ref 12.0–15.0)
Immature Granulocytes: 0 %
Lymphocytes Relative: 23 %
Lymphs Abs: 1.3 10*3/uL (ref 0.7–4.0)
MCH: 26.5 pg (ref 26.0–34.0)
MCHC: 29.8 g/dL — ABNORMAL LOW (ref 30.0–36.0)
MCV: 88.8 fL (ref 80.0–100.0)
Monocytes Absolute: 0.7 10*3/uL (ref 0.1–1.0)
Monocytes Relative: 12 %
Neutro Abs: 3.5 10*3/uL (ref 1.7–7.7)
Neutrophils Relative %: 62 %
Platelet Count: 439 10*3/uL — ABNORMAL HIGH (ref 150–400)
RBC: 3.21 MIL/uL — ABNORMAL LOW (ref 3.87–5.11)
RDW: 16.9 % — ABNORMAL HIGH (ref 11.5–15.5)
WBC Count: 5.7 10*3/uL (ref 4.0–10.5)
nRBC: 0 % (ref 0.0–0.2)

## 2021-08-01 LAB — CMP (CANCER CENTER ONLY)
ALT: 26 U/L (ref 0–44)
AST: 17 U/L (ref 15–41)
Albumin: 2.9 g/dL — ABNORMAL LOW (ref 3.5–5.0)
Alkaline Phosphatase: 296 U/L — ABNORMAL HIGH (ref 38–126)
Anion gap: 7 (ref 5–15)
BUN: 8 mg/dL (ref 6–20)
CO2: 28 mmol/L (ref 22–32)
Calcium: 8.4 mg/dL — ABNORMAL LOW (ref 8.9–10.3)
Chloride: 104 mmol/L (ref 98–111)
Creatinine: 1.04 mg/dL — ABNORMAL HIGH (ref 0.44–1.00)
GFR, Estimated: >60 mL/min (ref >60)
Glucose, Bld: 101 mg/dL — ABNORMAL HIGH (ref 70–99)
Potassium: 3 mmol/L — ABNORMAL LOW (ref 3.5–5.1)
Sodium: 139 mmol/L (ref 135–145)
Total Bilirubin: 0.3 mg/dL (ref 0.3–1.2)
Total Protein: 6.2 g/dL — ABNORMAL LOW (ref 6.5–8.1)

## 2021-08-01 LAB — MAGNESIUM: Magnesium: 1.7 mg/dL (ref 1.7–2.4)

## 2021-08-01 NOTE — Progress Notes (Signed)
Mg+ and KCL orders for 08/02/21 entered.

## 2021-08-01 NOTE — Progress Notes (Signed)
[facility] OFFICE PROGRESS NOTE   Diagnosis: Pancreas cancer  INTERVAL HISTORY:   Alyssa Ross returns as scheduled.  She completed another cycle of gemcitabine/Abraxane on 07/19/2021.  She has noted increased numbness and tingling in the extremities following the last 2 cycles of treatment.  This does not interfere with activity.  Objective:  Vital signs in last 24 hours:  Blood pressure 116/85, pulse 71, temperature 97.8 F (36.6 C), temperature source Oral, resp. rate 18, height 5' 8"  (1.727 m), weight 183 lb 6.4 oz (83.2 kg), last menstrual period 09/17/2012, SpO2 100 %.    HEENT: No thrush or ulcers Resp: Lungs clear bilaterally Cardio: Regular rate and rhythm GI: No hepatosplenomegaly Vascular: No leg edema Neurologic: Moderate loss of vibratory sense at the fingertips bilaterally  Portacath/PICC-without erythema  Lab Results:  Lab Results  Component Value Date   WBC 5.7 08/01/2021   HGB 8.5 (L) 08/01/2021   HCT 28.5 (L) 08/01/2021   MCV 88.8 08/01/2021   PLT 439 (H) 08/01/2021   NEUTROABS 3.5 08/01/2021    CMP  Lab Results  Component Value Date   NA 138 07/18/2021   K 3.2 (L) 07/18/2021   CL 105 07/18/2021   CO2 27 07/18/2021   GLUCOSE 194 (H) 07/18/2021   BUN 8 07/18/2021   CREATININE 1.11 (H) 07/18/2021   CALCIUM 8.2 (L) 07/18/2021   PROT 6.2 (L) 07/18/2021   ALBUMIN 2.7 (L) 07/18/2021   AST 24 07/18/2021   ALT 35 07/18/2021   ALKPHOS 310 (H) 07/18/2021   BILITOT 0.3 07/18/2021   GFRNONAA 59 (L) 07/18/2021   GFRAA >60 02/07/2020    Lab Results  Component Value Date   [id number] 27 07/18/2021    No results found.  Medications: I have reviewed the patient's current medications.   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 Tumor invades the duodenal wall and vascular groove, resection margins negative, 4/34 lymph nodes positive MSI-stable, tumor showed instability in 2 loci  as did adjacent normal tissue EUS FNA biopsy of pancreas mass on 07/03/2018-well-differentiated neuroendocrine tumor CTs 01/29/2019-ill-defined pancreas head mass, 5 pulmonary nodules-1 with a small amount of central cavitation, tumor abuts the left margin of the portal vein indistinct density surrounding, hepatic artery, complex cystic lesion of the right kidney, right adrenal mass-characterized as an adenoma on a [facility] MRI 12/21/2018 Netspot 03/03/2019-no focal pancreas activity, no tracer accumulation within the suspicious pulmonary nodules, left uterine mass with tracer accumulation felt to represent a leiomyoma Elevated preoperative CA 19-9--CA 19-9 186 on 01/18/2019 CT chest 04/16/2019-multiple bilateral pulmonary nodules, some with increased cavitation, stable in size Cycle 1 FOLFIRINOX 04/27/2019 Cycle 2 FOLFIRINOX 05/11/2019 Cycle 3 FOLFIRINOX 05/23/2019 Cycle 4 FOLFIRINOX 06/08/2019 Cycle 5 FOLFIRINOX 06/22/2019 CT chest 07/02/2019-stable size of bilateral pulmonary nodules.  Dominant cavitary lesions in both lungs show increased cavitation with thinner walls.  Stable 2.1 cm right adrenal nodule. 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 No definite evidence of metastatic disease in the abdomen or pelvis. Markedly enlarged and heterogeneous appearing uterus, likely to represent multifocal fibroids. 1 of these lesions appears to be an exophytic subserosal fibroid in the posterior lateral aspect  of the uterine body on the left side although this comes in close proximity to the left adnexa  such that a primary ovarian lesion is difficult to completely exclude. CTs 02/07/2020-slight enlargement of bilateral lung nodules, some are cavitary, no evidence of metastatic disease in the abdomen or pelvis, stable right adrenal nodule, uterine fibroids CTs 04/26/2020-mild enlargement of pulmonary nodules, slight increase in trace pelvic fluid, new soft tissue thickening inferior to the cecal tip suspicious for peritoneal metastasis CT 05/26/2020-improved appearance of soft tissue at the inferior tip of the cecum, mildly thickened short appendix-findings suggestive of resolving appendicitis, stable small bibasilar pulmonary nodules, fibroids Plan biopsy of right cecal tip soft tissue canceled secondary to radiologic improvement CT chest 08/02/2020-enlargement and progressive cavitation of multiple bilateral lung nodules.  Some new nodules are present. CTs 10/24/2020- increase in size of pulmonary nodules, no new nodules, no evidence of metastatic disease in the abdomen, stable right adrenal nodule CT 01/09/2021-slight interval enlargement of pulmonary nodules, stable right adrenal nodule Navigation bronchoscopy 01/30/2021-left lower lobe cavitary nodule FNA-adenocarcinoma, brushing-adenocarcinoma.  Left lower lobe lavage-adenocarcinoma.  Right upper lobe brushing and FNA biopsy of cavitary nodule-adenocarcinoma-immunohistochemical profile consistent with pancreas adenocarcinoma Cycle 1 gemcitabine/Abraxane 03/28/2021 Cycle 2 gemcitabine/Abraxane 04/11/2021 Cycle 3 gemcitabine/Abraxane 04/25/2021 Cycle 4 gemcitabine/Abraxane 05/09/2021 Cycle 5 gemcitabine/Abraxane 05/23/2021 CT chest 06/05/2021-interval cavitation of multiple pulmonary nodules, some nodules have decreased in size, no new or enlarging nodules Cycle 6 gemcitabine/Abraxane 06/06/2021 Cycle 7 gemcitabine/Abraxane 06/21/2021 Cycle 8 gemcitabine/Abraxane 07/05/2021 Cycle 9 Gemcitabine/Abraxane 07/19/2021 Cycle 10 gemcitabine 08/01/2021-Abraxane  held secondary to neuropathy   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 Schaff appears stable.  She has increased neuropathy symptoms.  Abraxane will be held from chemotherapy today.  She will complete a cycle of gemcitabine tomorrow.  She will undergo restaging chest CT prior to an office visit in 2 weeks.  [name], MD  08/01/2021  8:50 AM

## 2021-08-01 NOTE — Progress Notes (Signed)
Patient seen by Dr. Benay Spice today  Vitals are within treatment parameters.  Labs reviewed by Dr. Benay Spice and are within treatment parameters. Mg+ low at 1.7 and KCL low at 3.0. Already takes oral KCL and Mg+ at home. No change in home meds at this time.  Per physician team, patient is ready for treatment. Please note that modifications are being made to the treatment plan including Will infuse IV Mg+ 2 grams and IV KCL 30 meq during treatment on 08/02/21 at [facility].

## 2021-08-02 ENCOUNTER — Encounter: Payer: Self-pay | Admitting: *Deleted

## 2021-08-02 ENCOUNTER — Other Ambulatory Visit: Payer: Self-pay

## 2021-08-02 ENCOUNTER — Inpatient Hospital Stay: Payer: BC Managed Care – PPO

## 2021-08-02 VITALS — BP 112/75 | HR 98 | Temp 98.5°F | Resp 18

## 2021-08-02 DIAGNOSIS — E876 Hypokalemia: Secondary | ICD-10-CM

## 2021-08-02 DIAGNOSIS — C250 Malignant neoplasm of head of pancreas: Secondary | ICD-10-CM

## 2021-08-02 DIAGNOSIS — Z5111 Encounter for antineoplastic chemotherapy: Secondary | ICD-10-CM | POA: Diagnosis not present

## 2021-08-02 DIAGNOSIS — R918 Other nonspecific abnormal finding of lung field: Secondary | ICD-10-CM | POA: Diagnosis not present

## 2021-08-02 DIAGNOSIS — Z713 Dietary counseling and surveillance: Secondary | ICD-10-CM | POA: Diagnosis not present

## 2021-08-02 DIAGNOSIS — Z79899 Other long term (current) drug therapy: Secondary | ICD-10-CM | POA: Diagnosis not present

## 2021-08-02 DIAGNOSIS — Z923 Personal history of irradiation: Secondary | ICD-10-CM | POA: Diagnosis not present

## 2021-08-02 LAB — CANCER ANTIGEN 19-9: CA 19-9: 19 U/mL (ref 0–35)

## 2021-08-02 MED ORDER — MAGNESIUM SULFATE 2 GM/50ML IV SOLN
2.0000 g | Freq: Once | INTRAVENOUS | Status: AC
  Administered 2021-08-02: 2 g via INTRAVENOUS
  Filled 2021-08-02: qty 50

## 2021-08-02 MED ORDER — PROCHLORPERAZINE MALEATE 10 MG PO TABS
10.0000 mg | ORAL_TABLET | Freq: Once | ORAL | Status: AC
  Administered 2021-08-02: 10 mg via ORAL
  Filled 2021-08-02: qty 1

## 2021-08-02 MED ORDER — SODIUM CHLORIDE 0.9 % IV SOLN: Freq: Once | INTRAVENOUS | Status: AC

## 2021-08-02 MED ORDER — POTASSIUM CHLORIDE 10 MEQ/100ML IV SOLN
10.0000 meq | INTRAVENOUS | Status: AC
  Administered 2021-08-02 (×3): 10 meq via INTRAVENOUS
  Filled 2021-08-02 (×3): qty 100

## 2021-08-02 MED ORDER — HEPARIN SOD (PORK) LOCK FLUSH 100 UNIT/ML IV SOLN
500.0000 [IU] | Freq: Once | INTRAVENOUS | Status: AC | PRN
  Administered 2021-08-02: 500 [IU]

## 2021-08-02 MED ORDER — SODIUM CHLORIDE 0.9 % IV SOLN
1000.0000 mg/m2 | Freq: Once | INTRAVENOUS | Status: AC
  Administered 2021-08-02: 1976 mg via INTRAVENOUS
  Filled 2021-08-02: qty 51.97

## 2021-08-02 MED ORDER — SODIUM CHLORIDE 0.9% FLUSH
10.0000 mL | INTRAVENOUS | Status: DC | PRN
  Administered 2021-08-02: 10 mL

## 2021-08-02 NOTE — Progress Notes (Signed)
Dr. Benay Spice said it was okay to stop the K+ infusion at 6pm this evening (08/02/21). [employer] consultant notified and she verbalized understanding of instructions.

## 2021-08-02 NOTE — Patient Instructions (Signed)
[facility] ONCOLOGY  Discharge Instructions: Thank you for choosing [facility] to provide your oncology and hematology care.   If you have a lab appointment with the [facility], please go directly to the [facility] and check in at the registration area.   Wear comfortable clothing and clothing appropriate for easy access to any Portacath or PICC line.   We strive to give you quality time with your provider. You may need to reschedule your appointment if you arrive late (15 or more minutes).  Arriving late affects you and other patients whose appointments are after yours.  Also, if you miss three or more appointments without notifying the office, you may be dismissed from the clinic at the providers discretion.      For prescription refill requests, have your pharmacy contact our office and allow 72 hours for refills to be completed.    Today you received the following chemotherapy and/or immunotherapy agents gemzar      To help prevent nausea and vomiting after your treatment, we encourage you to take your nausea medication as directed.  BELOW ARE SYMPTOMS THAT SHOULD BE REPORTED IMMEDIATELY: *FEVER GREATER THAN 100.4 F (38 C) OR HIGHER *CHILLS OR SWEATING *NAUSEA AND VOMITING THAT IS NOT CONTROLLED WITH YOUR NAUSEA MEDICATION *UNUSUAL SHORTNESS OF BREATH *UNUSUAL BRUISING OR BLEEDING *URINARY PROBLEMS (pain or burning when urinating, or frequent urination) *BOWEL PROBLEMS (unusual diarrhea, constipation, pain near the anus) TENDERNESS IN MOUTH AND THROAT WITH OR WITHOUT PRESENCE OF ULCERS (sore throat, sores in mouth, or a toothache) UNUSUAL RASH, SWELLING OR PAIN  UNUSUAL VAGINAL DISCHARGE OR ITCHING   Items with * indicate a potential emergency and should be followed up as soon as possible or go to the Emergency Department if any problems should occur.  Please show the CHEMOTHERAPY ALERT CARD or IMMUNOTHERAPY ALERT CARD at check-in to the  Emergency Department and triage nurse.  Should you have questions after your visit or need to cancel or reschedule your appointment, please contact [facility]  Dept: [phone]  and follow the prompts.  Office hours are 8:00 a.m. to 4:30 p.m. Monday - Friday. Please note that voicemails left after 4:00 p.m. may not be returned until the following business day.  We are closed weekends and major holidays. You have access to a nurse at all times for urgent questions. Please call the main number to the clinic Dept: [phone] and follow the prompts.   For any non-urgent questions, you may also contact your provider using MyChart. We now offer e-Visits for anyone 5 and older to request care online for non-urgent symptoms. For details visit [email or web address].   Also download the MyChart app! Go to the app store, search "MyChart", open the app, select [facility], and log in with your MyChart username and password.  Due to Covid, a mask is required upon entering the hospital/clinic. If you do not have a mask, one will be given to you upon arrival. For doctor visits, patients may have 1 support person aged 56 or older with them. For treatment visits, patients cannot have anyone with them due to current Covid guidelines and our immunocompromised population.

## 2021-08-02 NOTE — Progress Notes (Signed)
Per Dr.Sherill ok to stop 3rd bag of potassium at 6pm due to time constraints.

## 2021-08-06 ENCOUNTER — Encounter: Payer: BC Managed Care – PPO | Admitting: Internal Medicine

## 2021-08-07 LAB — HM DIABETES EYE EXAM

## 2021-08-11 ENCOUNTER — Other Ambulatory Visit: Payer: Self-pay | Admitting: Oncology

## 2021-08-13 ENCOUNTER — Other Ambulatory Visit: Payer: Self-pay

## 2021-08-13 ENCOUNTER — Ambulatory Visit (HOSPITAL_BASED_OUTPATIENT_CLINIC_OR_DEPARTMENT_OTHER)
Admission: RE | Admit: 2021-08-13 | Discharge: 2021-08-13 | Disposition: A | Discharge status: Home / Self Care | Payer: BC Managed Care – PPO | Source: Ambulatory Visit | Attending: Oncology | Admitting: Oncology

## 2021-08-13 DIAGNOSIS — C250 Malignant neoplasm of head of pancreas: Secondary | ICD-10-CM | POA: Diagnosis not present

## 2021-08-13 DIAGNOSIS — I700 Atherosclerosis of aorta: Secondary | ICD-10-CM | POA: Diagnosis not present

## 2021-08-13 DIAGNOSIS — R911 Solitary pulmonary nodule: Secondary | ICD-10-CM | POA: Diagnosis not present

## 2021-08-13 DIAGNOSIS — R918 Other nonspecific abnormal finding of lung field: Secondary | ICD-10-CM | POA: Diagnosis not present

## 2021-08-15 ENCOUNTER — Encounter: Payer: Self-pay | Admitting: Oncology

## 2021-08-15 ENCOUNTER — Other Ambulatory Visit: Payer: Self-pay

## 2021-08-15 ENCOUNTER — Inpatient Hospital Stay: Payer: BC Managed Care – PPO | Attending: Genetic Counselor | Admitting: Oncology

## 2021-08-15 ENCOUNTER — Inpatient Hospital Stay: Payer: BC Managed Care – PPO

## 2021-08-15 VITALS — BP 118/81 | HR 81 | Temp 97.8°F | Resp 20 | Ht 68.0 in | Wt 184.8 lb

## 2021-08-15 DIAGNOSIS — G620 Drug-induced polyneuropathy: Secondary | ICD-10-CM | POA: Diagnosis not present

## 2021-08-15 DIAGNOSIS — Z79899 Other long term (current) drug therapy: Secondary | ICD-10-CM | POA: Insufficient documentation

## 2021-08-15 DIAGNOSIS — R918 Other nonspecific abnormal finding of lung field: Secondary | ICD-10-CM | POA: Diagnosis not present

## 2021-08-15 DIAGNOSIS — E119 Type 2 diabetes mellitus without complications: Secondary | ICD-10-CM | POA: Insufficient documentation

## 2021-08-15 DIAGNOSIS — C250 Malignant neoplasm of head of pancreas: Secondary | ICD-10-CM

## 2021-08-15 DIAGNOSIS — Z905 Acquired absence of kidney: Secondary | ICD-10-CM | POA: Diagnosis not present

## 2021-08-15 DIAGNOSIS — Z5111 Encounter for antineoplastic chemotherapy: Secondary | ICD-10-CM | POA: Diagnosis not present

## 2021-08-15 DIAGNOSIS — I10 Essential (primary) hypertension: Secondary | ICD-10-CM | POA: Diagnosis not present

## 2021-08-15 DIAGNOSIS — T451X5A Adverse effect of antineoplastic and immunosuppressive drugs, initial encounter: Secondary | ICD-10-CM | POA: Diagnosis not present

## 2021-08-15 LAB — CMP (CANCER CENTER ONLY)
ALT: 28 U/L (ref 0–44)
AST: 28 U/L (ref 15–41)
Albumin: 2.9 g/dL — ABNORMAL LOW (ref 3.5–5.0)
Alkaline Phosphatase: 210 U/L — ABNORMAL HIGH (ref 38–126)
Anion gap: 5 (ref 5–15)
BUN: 9 mg/dL (ref 6–20)
CO2: 26 mmol/L (ref 22–32)
Calcium: 8.5 mg/dL — ABNORMAL LOW (ref 8.9–10.3)
Chloride: 108 mmol/L (ref 98–111)
Creatinine: 1.07 mg/dL — ABNORMAL HIGH (ref 0.44–1.00)
GFR, Estimated: >60 mL/min (ref >60)
Glucose, Bld: 187 mg/dL — ABNORMAL HIGH (ref 70–99)
Potassium: 3.7 mmol/L (ref 3.5–5.1)
Sodium: 139 mmol/L (ref 135–145)
Total Bilirubin: 0.3 mg/dL (ref 0.3–1.2)
Total Protein: 6.4 g/dL — ABNORMAL LOW (ref 6.5–8.1)

## 2021-08-15 LAB — CBC WITH DIFFERENTIAL (CANCER CENTER ONLY)
Abs Immature Granulocytes: 0.01 10*3/uL (ref 0.00–0.07)
Basophils Absolute: 0 10*3/uL (ref 0.0–0.1)
Basophils Relative: 1 %
Eosinophils Absolute: 0.1 10*3/uL (ref 0.0–0.5)
Eosinophils Relative: 2 %
HCT: 28.6 % — ABNORMAL LOW (ref 36.0–46.0)
Hemoglobin: 8.4 g/dL — ABNORMAL LOW (ref 12.0–15.0)
Immature Granulocytes: 0 %
Lymphocytes Relative: 31 %
Lymphs Abs: 1.4 10*3/uL (ref 0.7–4.0)
MCH: 27.1 pg (ref 26.0–34.0)
MCHC: 29.4 g/dL — ABNORMAL LOW (ref 30.0–36.0)
MCV: 92.3 fL (ref 80.0–100.0)
Monocytes Absolute: 0.4 10*3/uL (ref 0.1–1.0)
Monocytes Relative: 9 %
Neutro Abs: 2.7 10*3/uL (ref 1.7–7.7)
Neutrophils Relative %: 57 %
Platelet Count: 279 10*3/uL (ref 150–400)
RBC: 3.1 MIL/uL — ABNORMAL LOW (ref 3.87–5.11)
RDW: 18.8 % — ABNORMAL HIGH (ref 11.5–15.5)
WBC Count: 4.6 10*3/uL (ref 4.0–10.5)
nRBC: 0 % (ref 0.0–0.2)

## 2021-08-15 LAB — MAGNESIUM: Magnesium: 1.6 mg/dL — ABNORMAL LOW (ref 1.7–2.4)

## 2021-08-15 NOTE — Progress Notes (Addendum)
Patient seen by Dr. Benay Spice today ? ?Vitals are within treatment parameters. ? ?Labs reviewed by Dr. Benay Spice and are within treatment parameters. K+ 3.7 and Magnesium 1.6. Dr. Benay Spice said that she did not need K+ or Mag replacement with the treatment scheduled for 08/16/2021 @ [facility].  ? ?Per physician team, patient is ready for treatment and there are NO modifications to the treatment plan.  ?

## 2021-08-15 NOTE — Progress Notes (Signed)
[facility] OFFICE PROGRESS NOTE   Diagnosis: Pancreas cancer  INTERVAL HISTORY:   Alyssa Ross returns as scheduled.  She completed another treatment with gemcitabine 08/02/2021.  She had less malaise following the cycle of chemotherapy.  She has persistent numbness in the feet.  She has left greater than right lower leg and ankle swelling following chemotherapy.  No pain.  Objective:  Vital signs in last 24 hours:  Blood pressure 118/81, pulse 81, temperature 97.8 F (36.6 C), temperature source Oral, resp. rate 20, height 5' 8"  (1.727 m), weight 184 lb 12.8 oz (83.8 kg), last menstrual period 09/17/2012, SpO2 100 %.    HEENT: No thrush or ulcers Resp: Lungs clear bilaterally Cardio: Regular rate and rhythm GI: No mass, no hepatosplenomegaly Vascular: Trace pitting edema at the left greater than right ankle, no erythema or tenderness over the lower legs  Skin: Mild hyperpigmentation of the palms  Portacath/PICC-without erythema  Lab Results:  Lab Results  Component Value Date   WBC 4.6 08/15/2021   HGB 8.4 (L) 08/15/2021   HCT 28.6 (L) 08/15/2021   MCV 92.3 08/15/2021   PLT 279 08/15/2021   NEUTROABS 2.7 08/15/2021    CMP  Lab Results  Component Value Date   NA 139 08/01/2021   K 3.0 (L) 08/01/2021   CL 104 08/01/2021   CO2 28 08/01/2021   GLUCOSE 101 (H) 08/01/2021   BUN 8 08/01/2021   CREATININE 1.04 (H) 08/01/2021   CALCIUM 8.4 (L) 08/01/2021   PROT 6.2 (L) 08/01/2021   ALBUMIN 2.9 (L) 08/01/2021   AST 17 08/01/2021   ALT 26 08/01/2021   ALKPHOS 296 (H) 08/01/2021   BILITOT 0.3 08/01/2021   GFRNONAA >60 08/01/2021   GFRAA >60 02/07/2020    Lab Results  Component Value Date   DJS970 19 08/01/2021     Imaging:  CT CHEST WO CONTRAST  Result Date: 08/14/2021 CLINICAL DATA:  Pancreatic cancer diagnosed 2020 status post Whipple procedure, with biopsy-proven lung metastases. Ongoing chemotherapy. Restaging. EXAM: CT CHEST WITHOUT CONTRAST  TECHNIQUE: Multidetector CT imaging of the chest was performed following the standard protocol without IV contrast. RADIATION DOSE REDUCTION: This exam was performed according to the departmental dose-optimization program which includes automated exposure control, adjustment of the mA and/or kV according to patient size and/or use of iterative reconstruction technique. * onc * COMPARISON:  06/05/2021 chest CT. FINDINGS: Cardiovascular: Normal heart size. No significant pericardial effusion/thickening. Left subclavian Port-A-Cath terminates in the upper third of the SVC. Mildly atherosclerotic nonaneurysmal thoracic aorta. Normal caliber pulmonary arteries. Mediastinum/Nodes: No discrete thyroid nodules. Unremarkable esophagus. No pathologically enlarged axillary, mediastinal or hilar lymph nodes, noting limited sensitivity for the detection of hilar adenopathy on this noncontrast study. Lungs/Pleura: No pneumothorax. No pleural effusion. Numerous (greater than 20) similar cavitary pulmonary nodules scattered throughout both lungs, all mildly decreased in size and wall thickness. Representative 1.7 x 1.5 cm superior segment left lower lobe nodule (series 4/image 51), previously 2.1 x 1.7 cm. Representative 1.8 x 1.4 cm right upper lobe nodule (series 4/image 56), previously 1.9 x 1.5 cm. Representative 1.0 x 0.9 cm posterior right lower lobe nodule (series 4/image 77), previously 1.2 x 1.0 cm. No new or enlarging pulmonary nodules. Upper abdomen: Indistinct hypodense 1.1 cm right liver dome lesion (series 2/image 89), not definitely changed since 06/05/2021. Questionable low-attenuation 2.2 cm posterior lower right liver lesion (series 2/image 121), not well evaluated on this noncontrast study, not definitely seen on prior. Partially visualized postsurgical  changes from Whipple surgery. Right adrenal 2.1 cm nodule with density 32 HU, stable since at least 01/29/2019 CT abdomen study, presumably a benign adenoma.  Musculoskeletal: No aggressive appearing focal osseous lesions. Mild thoracic spondylosis. IMPRESSION: 1. Numerous cavitary pulmonary metastases scattered throughout both lungs, all mildly decreased in size and wall thickness, compatible with interval positive response to therapy. No new or progressive metastatic disease in the chest. 2. Two indeterminate indistinct/questionable low-attenuation right liver lesions, not well evaluated on this noncontrast study, at least one of which in the posterior right liver not definitely seen on prior CT. MRI abdomen without and with IV contrast may be obtained for further characterization as clinically warranted. 3. Aortic Atherosclerosis (ICD10-I70.0). Electronically Signed   By: Ilona Sorrel M.D.   On: 08/14/2021 09:19    Medications: I have reviewed the patient's current medications.   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 Tumor invades the duodenal wall and vascular groove, resection margins negative, 4/34 lymph nodes positive MSI-stable, tumor showed instability in 2 loci as did adjacent normal tissue EUS FNA biopsy of pancreas mass on 07/03/2018-well-differentiated neuroendocrine tumor CTs 01/29/2019-ill-defined pancreas head mass, 5 pulmonary nodules-1 with a small amount of central cavitation, tumor abuts the left margin of the portal vein indistinct density surrounding, hepatic artery, complex cystic lesion of the right kidney, right adrenal mass-characterized as an adenoma on a [facility] MRI 12/21/2018 Netspot 03/03/2019-no focal pancreas activity, no tracer accumulation within the suspicious pulmonary nodules, left uterine mass with tracer accumulation felt to represent a leiomyoma Elevated preoperative CA 19-9--CA 19-9 186 on 01/18/2019 CT chest 04/16/2019-multiple bilateral pulmonary nodules, some with increased cavitation, stable in size Cycle 1 FOLFIRINOX 04/27/2019 Cycle 2 FOLFIRINOX  05/11/2019 Cycle 3 FOLFIRINOX 05/23/2019 Cycle 4 FOLFIRINOX 06/08/2019 Cycle 5 FOLFIRINOX 06/22/2019 CT chest 07/02/2019-stable size of bilateral pulmonary nodules.  Dominant cavitary lesions in both lungs show increased cavitation with thinner walls.  Stable 2.1 cm right adrenal nodule. 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 No definite evidence of metastatic disease in the abdomen or pelvis. Markedly enlarged and heterogeneous appearing uterus, likely to represent multifocal fibroids. 1 of these lesions appears to be an exophytic subserosal fibroid in the posterior lateral aspect of the uterine body on the left side although this comes in close proximity to the left adnexa such that a primary ovarian lesion is difficult to completely exclude. CTs 02/07/2020-slight enlargement of bilateral lung nodules, some are cavitary, no evidence of metastatic disease in the abdomen or pelvis, stable right adrenal nodule, uterine fibroids CTs 04/26/2020-mild enlargement of pulmonary nodules, slight increase in trace pelvic fluid, new soft tissue thickening inferior to the cecal tip suspicious for peritoneal metastasis CT 05/26/2020-improved appearance of soft tissue at the inferior tip of the cecum, mildly thickened short appendix-findings suggestive of resolving appendicitis, stable small bibasilar pulmonary nodules, fibroids Plan biopsy of right cecal tip soft tissue canceled secondary to radiologic improvement CT chest 08/02/2020-enlargement and progressive cavitation of multiple bilateral lung  nodules.  Some new nodules are present. CTs 10/24/2020- increase in size of pulmonary nodules, no new nodules, no evidence of metastatic disease in the abdomen, stable right adrenal nodule CT  01/09/2021-slight interval enlargement of pulmonary nodules, stable right adrenal nodule Navigation bronchoscopy 01/30/2021-left lower lobe cavitary nodule FNA-adenocarcinoma, brushing-adenocarcinoma.  Left lower lobe lavage-adenocarcinoma.  Right upper lobe brushing and FNA biopsy of cavitary nodule-adenocarcinoma-immunohistochemical profile consistent with pancreas adenocarcinoma Cycle 1 gemcitabine/Abraxane 03/28/2021 Cycle 2 gemcitabine/Abraxane 04/11/2021 Cycle 3 gemcitabine/Abraxane 04/25/2021 Cycle 4 gemcitabine/Abraxane 05/09/2021 Cycle 5 gemcitabine/Abraxane 05/23/2021 CT chest 06/05/2021-interval cavitation of multiple pulmonary nodules, some nodules have decreased in size, no new or enlarging nodules Cycle 6 gemcitabine/Abraxane 06/06/2021 Cycle 7 gemcitabine/Abraxane 06/21/2021 Cycle 8 gemcitabine/Abraxane 07/05/2021 Cycle 9 Gemcitabine/Abraxane 07/19/2021 Cycle 10 gemcitabine 08/01/2021-Abraxane held secondary to neuropathy CT chest 08/13/2021-mild decrease in size and wall thickness of multiple cavitary nodules, no new or progressive disease in the chest, indeterminate low-attenuation right liver lesions Cycle 11 gemcitabine 08/16/2021-Abraxane held secondary to neuropathy   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  The CA 19-9 is lower and the lung lesions appear smaller.  I reviewed the CT findings and images with her.  I suspect the indeterminate liver findings are benign.  The plan is to continue gemcitabine/Abraxane.  Abraxane will be held again today secondary to persistent neuropathy symptoms.  The potassium and magnesium levels are adequate today.  She will return for an office and lab visit in 2 weeks.  She will call for increased leg swelling or pain.  [name], MD  08/15/2021  8:40 AM

## 2021-08-16 ENCOUNTER — Inpatient Hospital Stay: Payer: BC Managed Care – PPO

## 2021-08-16 VITALS — BP 115/78 | HR 68 | Temp 97.9°F | Resp 18

## 2021-08-16 DIAGNOSIS — G620 Drug-induced polyneuropathy: Secondary | ICD-10-CM | POA: Diagnosis not present

## 2021-08-16 DIAGNOSIS — Z905 Acquired absence of kidney: Secondary | ICD-10-CM | POA: Diagnosis not present

## 2021-08-16 DIAGNOSIS — T451X5A Adverse effect of antineoplastic and immunosuppressive drugs, initial encounter: Secondary | ICD-10-CM | POA: Diagnosis not present

## 2021-08-16 DIAGNOSIS — Z5111 Encounter for antineoplastic chemotherapy: Secondary | ICD-10-CM | POA: Diagnosis not present

## 2021-08-16 DIAGNOSIS — R918 Other nonspecific abnormal finding of lung field: Secondary | ICD-10-CM | POA: Diagnosis not present

## 2021-08-16 DIAGNOSIS — I10 Essential (primary) hypertension: Secondary | ICD-10-CM | POA: Diagnosis not present

## 2021-08-16 DIAGNOSIS — Z95828 Presence of other vascular implants and grafts: Secondary | ICD-10-CM

## 2021-08-16 DIAGNOSIS — C250 Malignant neoplasm of head of pancreas: Secondary | ICD-10-CM | POA: Diagnosis not present

## 2021-08-16 DIAGNOSIS — Z79899 Other long term (current) drug therapy: Secondary | ICD-10-CM | POA: Diagnosis not present

## 2021-08-16 DIAGNOSIS — E119 Type 2 diabetes mellitus without complications: Secondary | ICD-10-CM | POA: Diagnosis not present

## 2021-08-16 MED ORDER — PROCHLORPERAZINE MALEATE 10 MG PO TABS
10.0000 mg | ORAL_TABLET | Freq: Once | ORAL | Status: AC
  Administered 2021-08-16: 14:00:00 10 mg via ORAL
  Filled 2021-08-16: qty 1

## 2021-08-16 MED ORDER — SODIUM CHLORIDE 0.9% FLUSH
10.0000 mL | INTRAVENOUS | Status: DC | PRN
  Administered 2021-08-16: 15:00:00 10 mL

## 2021-08-16 MED ORDER — ALTEPLASE 2 MG IJ SOLR
2.0000 mg | Freq: Once | INTRAMUSCULAR | Status: AC
  Administered 2021-08-16: 14:00:00 2 mg
  Filled 2021-08-16: qty 2

## 2021-08-16 MED ORDER — SODIUM CHLORIDE 0.9 % IV SOLN
1000.0000 mg/m2 | Freq: Once | INTRAVENOUS | Status: AC
  Administered 2021-08-16: 15:00:00 1976 mg via INTRAVENOUS
  Filled 2021-08-16: qty 51.97

## 2021-08-16 MED ORDER — HEPARIN SOD (PORK) LOCK FLUSH 100 UNIT/ML IV SOLN
500.0000 [IU] | Freq: Once | INTRAVENOUS | Status: AC | PRN
  Administered 2021-08-16: 15:00:00 500 [IU]

## 2021-08-16 MED ORDER — SODIUM CHLORIDE 0.9 % IV SOLN: Freq: Once | INTRAVENOUS | Status: AC

## 2021-08-16 NOTE — Patient Instructions (Signed)
[facility]  Discharge Instructions: Thank you for choosing [facility] to provide your oncology and hematology care.   If you have a lab appointment with the Cancer Center, please go directly to the Cancer Center and check in at the registration area.   Wear comfortable clothing and clothing appropriate for easy access to any Portacath or PICC line.   We strive to give you quality time with your provider. You may need to reschedule your appointment if you arrive late (15 or more minutes).  Arriving late affects you and other patients whose appointments are after yours.  Also, if you miss three or more appointments without notifying the office, you may be dismissed from the clinic at the provider's discretion.      For prescription refill requests, have your pharmacy contact our office and allow 72 hours for refills to be completed.    Today you received the following chemotherapy and/or immunotherapy agents Gemzar      To help prevent nausea and vomiting after your treatment, we encourage you to take your nausea medication as directed.  BELOW ARE SYMPTOMS THAT SHOULD BE REPORTED IMMEDIATELY: *FEVER GREATER THAN 100.4 F (38 C) OR HIGHER *CHILLS OR SWEATING *NAUSEA AND VOMITING THAT IS NOT CONTROLLED WITH YOUR NAUSEA MEDICATION *UNUSUAL SHORTNESS OF BREATH *UNUSUAL BRUISING OR BLEEDING *URINARY PROBLEMS (pain or burning when urinating, or frequent urination) *BOWEL PROBLEMS (unusual diarrhea, constipation, pain near the anus) TENDERNESS IN MOUTH AND THROAT WITH OR WITHOUT PRESENCE OF ULCERS (sore throat, sores in mouth, or a toothache) UNUSUAL RASH, SWELLING OR PAIN  UNUSUAL VAGINAL DISCHARGE OR ITCHING   Items with * indicate a potential emergency and should be followed up as soon as possible or go to the Emergency Department if any problems should occur.  Please show the CHEMOTHERAPY ALERT CARD or IMMUNOTHERAPY ALERT CARD at check-in to the  Emergency Department and triage nurse.  Should you have questions after your visit or need to cancel or reschedule your appointment, please contact [facility]  Dept: [phone]  and follow the prompts.  Office hours are 8:00 a.m. to 4:30 p.m. Monday - Friday. Please note that voicemails left after 4:00 p.m. may not be returned until the following business day.  We are closed weekends and major holidays. You have access to a nurse at all times for urgent questions. Please call the main number to the clinic Dept: [phone] and follow the prompts.   For any non-urgent questions, you may also contact your provider using MyChart. We now offer e-Visits for anyone 18 and older to request care online for non-urgent symptoms. For details visit [email or web address].   Also download the MyChart app! Go to the app store, search "MyChart", open the app, select [facility], and log in with your MyChart username and password.  Due to Covid, a mask is required upon entering the hospital/clinic. If you do not have a mask, one will be given to you upon arrival. For doctor visits, patients may have 1 support person aged 18 or older with them. For treatment visits, patients cannot have anyone with them due to current Covid guidelines and our immunocompromised population.   

## 2021-08-16 NOTE — Progress Notes (Signed)
Per MD note from 08/15/21 hold Abraxane today d/t Pt's persistent neuropathy.  ?

## 2021-08-20 ENCOUNTER — Other Ambulatory Visit: Payer: Self-pay

## 2021-08-20 ENCOUNTER — Ambulatory Visit (INDEPENDENT_AMBULATORY_CARE_PROVIDER_SITE_OTHER): Payer: BC Managed Care – PPO | Admitting: Internal Medicine

## 2021-08-20 ENCOUNTER — Encounter: Payer: Self-pay | Admitting: Internal Medicine

## 2021-08-20 VITALS — BP 110/72 | HR 70 | Temp 98.3°F | Ht 67.8 in | Wt 177.8 lb

## 2021-08-20 DIAGNOSIS — E1165 Type 2 diabetes mellitus with hyperglycemia: Secondary | ICD-10-CM

## 2021-08-20 DIAGNOSIS — G620 Drug-induced polyneuropathy: Secondary | ICD-10-CM

## 2021-08-20 DIAGNOSIS — D473 Essential (hemorrhagic) thrombocythemia: Secondary | ICD-10-CM | POA: Insufficient documentation

## 2021-08-20 DIAGNOSIS — Z794 Long term (current) use of insulin: Secondary | ICD-10-CM | POA: Diagnosis not present

## 2021-08-20 DIAGNOSIS — E663 Overweight: Secondary | ICD-10-CM | POA: Diagnosis not present

## 2021-08-20 DIAGNOSIS — Z0000 Encounter for general adult medical examination without abnormal findings: Secondary | ICD-10-CM | POA: Diagnosis not present

## 2021-08-20 DIAGNOSIS — Z6827 Body mass index (BMI) 27.0-27.9, adult: Secondary | ICD-10-CM

## 2021-08-20 DIAGNOSIS — I10 Essential (primary) hypertension: Secondary | ICD-10-CM

## 2021-08-20 DIAGNOSIS — T451X5A Adverse effect of antineoplastic and immunosuppressive drugs, initial encounter: Secondary | ICD-10-CM

## 2021-08-20 LAB — POCT URINALYSIS DIPSTICK
Bilirubin, UA: NEGATIVE
Blood, UA: NEGATIVE
Glucose, UA: POSITIVE — AB
Ketones, UA: NEGATIVE
Leukocytes, UA: NEGATIVE
Nitrite, UA: NEGATIVE
Protein, UA: POSITIVE — AB
Spec Grav, UA: >=1.03 — AB (ref 1.010–1.025)
Urobilinogen, UA: 0.2 E.U./dL
pH, UA: 6 (ref 5.0–8.0)

## 2021-08-20 MED ORDER — ROSUVASTATIN CALCIUM 10 MG PO TABS: 10.0000 mg | ORAL_TABLET | Freq: Every day | ORAL | 2 refills | Status: DC

## 2021-08-20 MED ORDER — VALSARTAN 40 MG PO TABS: 40.0000 mg | ORAL_TABLET | Freq: Every day | ORAL | 11 refills | Status: DC

## 2021-08-20 NOTE — Patient Instructions (Signed)

## 2021-08-20 NOTE — Progress Notes (Unsigned)
Rich Brave [place]leton,acting as a Education administrator for Maximino Greenland, MD.,have documented all relevant documentation on the behalf of Maximino Greenland, MD,as directed by  Maximino Greenland, MD while in the presence of Maximino Greenland, MD.  This visit occurred during the SARS-CoV-2 public health emergency.  Safety protocols were in place, including screening questions prior to the visit, additional usage of staff PPE, and extensive cleaning of exam room while observing appropriate contact time as indicated for disinfecting solutions.  Subjective:     Patient ID: Alyssa Ross , female    DOB: Feb 02, 1966 , 56 y.o.   MRN: [id number]   Chief Complaint  Patient presents with   Annual Exam    HPI  She is here today for a full physical examination. She is followed by Dr. Charlesetta Garibaldi for her GYN care.  She is also followed by Dr. Braulio Conte at [facility] for her diabetes. She is now on [place] and Humalog. Dr. Braulio Conte has taken her off of metformin. She states her a1c is now 9.3. She is also followed by Dr. Benay Spice for pancreatic cancer.   Diabetes She presents for her follow-up diabetic visit. She has type 2 diabetes mellitus. There are no hypoglycemic associated symptoms. Pertinent negatives for diabetes include no blurred vision and no chest pain. There are no hypoglycemic complications. Risk factors for coronary artery disease include diabetes mellitus, hypertension and dyslipidemia. Current diabetic treatment includes insulin injections. She is compliant with treatment most of the time. She is following a diabetic diet. She participates in exercise intermittently. Eye exam is current.  Hypertension This is a chronic problem. The current episode started more than 1 year ago. The problem has been gradually improving since onset. The problem is uncontrolled. Pertinent negatives include no blurred vision, chest pain, palpitations or shortness of breath. Past treatments include ACE inhibitors and diuretics. The current  treatment provides moderate improvement.    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   Lung nodule    pancreatic ca dx'd 11/2018   Neuroendocrine Tumor of the pancreas   PONV (postoperative nausea and vomiting)    nausea  vomitting after 12/25/18 ERCP   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   Yeast vaginitis 2006     Family History  Problem Relation Age of Onset   Diabetes Mother    Dementia Mother    Hyperlipidemia Mother    Hypertension Mother    Irregular heart beat Mother    Diabetes Father    Hypertension Father    Hyperlipidemia Father    Down syndrome Sister    Diabetes Sister    Hyperlipidemia Brother    Heart Problems Brother    Goiter Maternal Aunt    Thyroid nodules Sister    Cancer Cousin 9       eye; maternal first cousin   Goiter Cousin    Cancer Paternal Aunt        unknown form of cancer   Cancer Cousin        unknown form of cancer; paternal first cousin   Pancreatic cancer  Cousin 33       paternal first cousin   Cancer Cousin 41       unknown cancer; paternal first cousin   Cancer Cousin 62       unknown cancer; paternal first cousin     Current Outpatient Medications:    glucose blood test strip, Check sugar 2 times daily., Disp: 100 each, Rfl: 12   Insulin Degludec (TRESIBA FLEXTOUCH [place]), Inject into the skin. 12-18 Units, Disp: , Rfl:    insulin lispro (HUMALOG KWIKPEN) 200 UNIT/ML KwikPen, 6 units with breakfast, 13 units with lunch, 16 units with dinner, Disp: , Rfl:    Insulin Pen Needle (BD PEN NEEDLE NANO U/F) 32G X 4 MM MISC, USE TO INJECT  INSULIN THREE TIMES A DAY, Disp: 270 each, Rfl: 3   lidocaine-prilocaine (EMLA) cream, Apply 1 application topically as directed. Apply 1 hour prior to stick and cover with plastic wrap, Disp: 30 g, Rfl: 5   loratadine (CLARITIN) 10 MG tablet, Take 10 mg by mouth at bedtime. , Disp: , Rfl:    magic mouthwash SOLN, Take 5 mLs by mouth 4 (four) times daily as needed for mouth pain., Disp: 240 mL, Rfl: 0   MAGNESIUM-OXIDE 400 (240 Mg) MG tablet, TAKE 1 TABLET(400 MG) BY MOUTH THREE TIMES DAILY, Disp: 270 tablet, Rfl: 1   potassium chloride SA (KLOR-CON M) 20 MEQ tablet, Take 1 tablet (20 mEq total) by mouth 4 (four) times daily., Disp: 120 tablet, Rfl: 2   valsartan (DIOVAN) 40 MG tablet, Take 1 tablet (40 mg total) by mouth daily., Disp: 30 tablet, Rfl: 11   Continuous Blood Gluc Receiver (FREESTYLE LIBRE 14 DAY READER) DEVI, USE READER TO CHECK BLOOD GLUCOSE WITH FREESTYLE LIBRE SENSORS., Disp: 1 Device, Rfl: 0   Continuous Blood Gluc Sensor (FREESTYLE LIBRE 2 SENSOR) MISC, APPLY EVERY 14 DAYS, Disp: 2 each, Rfl: 5   prochlorperazine (COMPAZINE) 10 MG tablet, TAKE 1 TABLET(10 MG) BY MOUTH EVERY 6 HOURS AS NEEDED FOR NAUSEA OR VOMITING (Patient not taking: Reported on 08/20/2021), Disp: 60 tablet, Rfl: 1   rosuvastatin (CRESTOR) 10 MG tablet, Take 1 tablet (10 mg total) by mouth at bedtime., Disp: 90 tablet, Rfl: 2   Allergies  Allergen Reactions   Cherry Rash, Itching and Other (See Comments)    Other reaction(s): Unknown Other reaction(s): Unknown   Lemon Oil Rash      The patient states she uses post menopausal status for birth control. Last LMP was Patient's last menstrual period was 09/17/2012.. Negative for Dysmenorrhea. Negative for: breast discharge, breast lump(s), breast pain and breast self exam. Associated symptoms include abnormal vaginal bleeding. Pertinent negatives include abnormal bleeding (hematology), anxiety, decreased libido, depression, difficulty falling sleep, dyspareunia,  history of infertility, nocturia, sexual dysfunction, sleep disturbances, urinary incontinence, urinary urgency, vaginal discharge and vaginal itching. Diet regular.The patient states her exercise level is  intermittent.  . The patient's tobacco use is:  Social History   Tobacco Use  Smoking Status Never  Smokeless Tobacco Never  . She has been exposed to passive smoke. The patient's alcohol use is:  Social History   Substance and Sexual Activity  Alcohol Use Not Currently   Comment: rarely   Review of Systems  Constitutional: Negative.   HENT: Negative.    Eyes: Negative.  Negative for blurred vision.  Respiratory: Negative.  Negative for shortness of breath.   Cardiovascular: Negative.  Negative for chest pain and palpitations.  Endocrine: Negative.   Genitourinary: Negative.  Musculoskeletal: Negative.   Skin: Negative.   Allergic/Immunologic: Negative.   Neurological: Negative.   Hematological: Negative.   Psychiatric/Behavioral: Negative.      Today's Vitals   08/20/21 1026  BP: 110/72  Pulse: 70  Temp: 98.3 F (36.8 C)  Weight: 177 lb 12.8 oz (80.6 kg)  Height: 5' 7.8" (1.722 m)   Body mass index is 27.19 kg/m.  Wt Readings from Last 3 Encounters:  08/20/21 177 lb 12.8 oz (80.6 kg)  08/15/21 184 lb 12.8 oz (83.8 kg)  08/01/21 183 lb 6.4 oz (83.2 kg)    BP Readings from Last 3 Encounters:  08/20/21 110/72  08/16/21 115/78  08/15/21 118/81    Objective:  Physical Exam      Assessment And Plan:     1. Encounter for general adult medical examination w/o abnormal findings Comments: A full exam was performed. Importance of monthly self breast exams was discussed with the patient.  - Lipid panel  2. Uncontrolled type 2 diabetes mellitus with hyperglycemia, with long-term current use of insulin ([place]) Comments: Diabetic foot exam was performed.  - POCT Urinalysis Dipstick (81002) - Microalbumin / Creatinine Urine Ratio  3. Essential  hypertension Comments: Chronic, she has had chronicaly low potassium. Will d/c lisinopril/hctz. I will switch to ARB to decrease risk of angioedema.  - EKG 12-Lead  4. 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 Overweight with body mass index (BMI) of 27 to 27.9 in adult   She is encouraged to strive for BMI less than 27 to decrease cardiac risk. Advised to aim for at least 150 minutes of exercise per week.   Patient was given opportunity to ask questions. Patient verbalized understanding of the plan and was able to repeat key elements of the plan. All questions were answered to their satisfaction.   I, Maximino Greenland, MD, have reviewed all documentation for this visit. The documentation on 08/20/21 for the exam, diagnosis, procedures, and orders are all accurate and complete.   THE PATIENT IS ENCOURAGED TO PRACTICE SOCIAL DISTANCING DUE TO THE COVID-19 PANDEMIC.

## 2021-08-21 LAB — MICROALBUMIN / CREATININE URINE RATIO
Creatinine, Urine: 182.9 mg/dL
Microalb/Creat Ratio: 21 mg/g creat (ref 0–29)
Microalbumin, Urine: 38.6 ug/mL

## 2021-08-21 LAB — LIPID PANEL
Chol/HDL Ratio: 2.3 ratio (ref 0.0–4.4)
Cholesterol, Total: 124 mg/dL (ref 100–199)
HDL: 55 mg/dL (ref >39)
LDL Chol Calc (NIH): 52 mg/dL (ref 0–99)
Triglycerides: 86 mg/dL (ref 0–149)
VLDL Cholesterol Cal: 17 mg/dL (ref 5–40)

## 2021-08-25 ENCOUNTER — Encounter: Payer: Self-pay | Admitting: Internal Medicine

## 2021-08-29 ENCOUNTER — Other Ambulatory Visit: Payer: Self-pay

## 2021-08-29 ENCOUNTER — Inpatient Hospital Stay: Payer: BC Managed Care – PPO | Admitting: Nurse Practitioner

## 2021-08-29 ENCOUNTER — Encounter: Payer: Self-pay | Admitting: Nurse Practitioner

## 2021-08-29 ENCOUNTER — Telehealth: Payer: Self-pay

## 2021-08-29 ENCOUNTER — Inpatient Hospital Stay: Payer: BC Managed Care – PPO

## 2021-08-29 VITALS — BP 140/86 | HR 78 | Temp 98.1°F | Resp 18 | Ht 67.8 in | Wt 192.4 lb

## 2021-08-29 DIAGNOSIS — I10 Essential (primary) hypertension: Secondary | ICD-10-CM | POA: Diagnosis not present

## 2021-08-29 DIAGNOSIS — Z79899 Other long term (current) drug therapy: Secondary | ICD-10-CM | POA: Diagnosis not present

## 2021-08-29 DIAGNOSIS — T451X5A Adverse effect of antineoplastic and immunosuppressive drugs, initial encounter: Secondary | ICD-10-CM | POA: Diagnosis not present

## 2021-08-29 DIAGNOSIS — C250 Malignant neoplasm of head of pancreas: Secondary | ICD-10-CM

## 2021-08-29 DIAGNOSIS — G620 Drug-induced polyneuropathy: Secondary | ICD-10-CM | POA: Diagnosis not present

## 2021-08-29 DIAGNOSIS — Z905 Acquired absence of kidney: Secondary | ICD-10-CM | POA: Diagnosis not present

## 2021-08-29 DIAGNOSIS — Z5111 Encounter for antineoplastic chemotherapy: Secondary | ICD-10-CM | POA: Diagnosis not present

## 2021-08-29 DIAGNOSIS — R918 Other nonspecific abnormal finding of lung field: Secondary | ICD-10-CM | POA: Diagnosis not present

## 2021-08-29 DIAGNOSIS — E119 Type 2 diabetes mellitus without complications: Secondary | ICD-10-CM | POA: Diagnosis not present

## 2021-08-29 LAB — BASIC METABOLIC PANEL
BUN: 6 (ref 4–21)
CO2: 24 — AB (ref 13–22)
Chloride: 113 — AB (ref 99–108)
Creatinine: 1.1 (ref 0.5–1.1)
Glucose: 150
Potassium: 4 mEq/L (ref 3.5–5.1)
Sodium: 141 (ref 137–147)

## 2021-08-29 LAB — CMP (CANCER CENTER ONLY)
ALT: 34 U/L (ref 0–44)
AST: 35 U/L (ref 15–41)
Albumin: 3 g/dL — ABNORMAL LOW (ref 3.5–5.0)
Alkaline Phosphatase: 188 U/L — ABNORMAL HIGH (ref 38–126)
Anion gap: 4 — ABNORMAL LOW (ref 5–15)
BUN: 6 mg/dL (ref 6–20)
CO2: 24 mmol/L (ref 22–32)
Calcium: 8.6 mg/dL — ABNORMAL LOW (ref 8.9–10.3)
Chloride: 113 mmol/L — ABNORMAL HIGH (ref 98–111)
Creatinine: 1.12 mg/dL — ABNORMAL HIGH (ref 0.44–1.00)
GFR, Estimated: 58 mL/min — ABNORMAL LOW (ref >60)
Glucose, Bld: 150 mg/dL — ABNORMAL HIGH (ref 70–99)
Potassium: 4 mmol/L (ref 3.5–5.1)
Sodium: 141 mmol/L (ref 135–145)
Total Bilirubin: 0.3 mg/dL (ref 0.3–1.2)
Total Protein: 6.5 g/dL (ref 6.5–8.1)

## 2021-08-29 LAB — CBC WITH DIFFERENTIAL (CANCER CENTER ONLY)
Abs Immature Granulocytes: 0 10*3/uL (ref 0.00–0.07)
Basophils Absolute: 0 10*3/uL (ref 0.0–0.1)
Basophils Relative: 1 %
Eosinophils Absolute: 0.1 10*3/uL (ref 0.0–0.5)
Eosinophils Relative: 2 %
HCT: 28.7 % — ABNORMAL LOW (ref 36.0–46.0)
Hemoglobin: 8.4 g/dL — ABNORMAL LOW (ref 12.0–15.0)
Immature Granulocytes: 0 %
Lymphocytes Relative: 27 %
Lymphs Abs: 1.1 10*3/uL (ref 0.7–4.0)
MCH: 27.5 pg (ref 26.0–34.0)
MCHC: 29.3 g/dL — ABNORMAL LOW (ref 30.0–36.0)
MCV: 93.8 fL (ref 80.0–100.0)
Monocytes Absolute: 0.5 10*3/uL (ref 0.1–1.0)
Monocytes Relative: 12 %
Neutro Abs: 2.4 10*3/uL (ref 1.7–7.7)
Neutrophils Relative %: 58 %
Platelet Count: 242 10*3/uL (ref 150–400)
RBC: 3.06 MIL/uL — ABNORMAL LOW (ref 3.87–5.11)
RDW: 19.6 % — ABNORMAL HIGH (ref 11.5–15.5)
WBC Count: 4.1 10*3/uL (ref 4.0–10.5)
nRBC: 0 % (ref 0.0–0.2)

## 2021-08-29 LAB — HEPATIC FUNCTION PANEL
ALT: 34 U/L (ref 7–35)
AST: 35 (ref 13–35)
Alkaline Phosphatase: 188 — AB (ref 25–125)
Bilirubin, Total: 0.3

## 2021-08-29 LAB — CBC AND DIFFERENTIAL: Hemoglobin: 8.4 — AB (ref 12.0–16.0)

## 2021-08-29 LAB — COMPREHENSIVE METABOLIC PANEL: Calcium: 8.6 — AB (ref 8.7–10.7)

## 2021-08-29 LAB — MAGNESIUM: Magnesium: 1.8 mg/dL (ref 1.7–2.4)

## 2021-08-29 NOTE — Progress Notes (Signed)
?[name] ?OFFICE PROGRESS NOTE ? ? ?Diagnosis:  Pancreas cancer ? ?INTERVAL HISTORY:  ? ?Alyssa Ross returns as scheduled. She completed a cycle of Gemcitabine 08/16/2021. Abraxane held due to neuropathy symptoms.  She denies nausea/vomiting.  No mouth sores.  No diarrhea.  No fever or rash after treatment.  She has persistent neuropathy symptoms mainly affecting the feet, now extending to the ball of both feet.  Left tends to be more noticeable than right.  Some leg swelling, occurs periodically. ? ?Objective: ? ?Vital signs in last 24 hours: ? ?Blood pressure 140/86, pulse 78, temperature 98.1 ?F (36.7 ?C), temperature source Oral, resp. rate 18, height 5' 7.8" (1.722 m), weight 192 lb 6.4 oz (87.3 kg), last menstrual period 09/17/2012, SpO2 100 %. ?  ? ?HEENT: No thrush or ulcers. ?Resp: Lungs clear bilaterally. ?Cardio: Regular rate and rhythm. ?GI: Abdomen soft and nontender.  No hepatomegaly. ?Vascular: Trace lower leg edema bilaterally.  Left lower leg appears slightly larger than the right lower leg. ?Skin: No rash. ?Port-A-Cath without erythema ? ? ?Lab Results: ? ?Lab Results  ?Component Value Date  ? WBC 4.1 08/29/2021  ? HGB 8.4 (L) 08/29/2021  ? HCT 28.7 (L) 08/29/2021  ? MCV 93.8 08/29/2021  ? PLT 242 08/29/2021  ? NEUTROABS 2.4 08/29/2021  ? ? ?Imaging: ? ?No results found. ? ?Medications: I have reviewed the patient's current medications. ? 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 ?CTs 01/29/2019-ill-defined pancreas head mass, 5 pulmonary nodules-1 with a small amount of central cavitation, tumor abuts the left margin of the portal vein indistinct density  surrounding, hepatic artery, complex cystic lesion of the right kidney, right adrenal mass-characterized as an adenoma on a [facility] MRI 12/21/2018 ?Netspot 03/03/2019-no focal pancreas activity, no tracer accumulation within the suspicious pulmonary nodules, left uterine mass with tracer accumulation felt to represent a leiomyoma ?Elevated preoperative CA 19-9--CA 19-9 186 on 01/18/2019 ?CT chest 04/16/2019-multiple bilateral pulmonary nodules, some with increased cavitation, stable in size ?Cycle 1 FOLFIRINOX 04/27/2019 ?Cycle 2 FOLFIRINOX 05/11/2019 ?Cycle 3 FOLFIRINOX 05/23/2019 ?Cycle 4 FOLFIRINOX 06/08/2019 ?Cycle 5 FOLFIRINOX 06/22/2019 ?CT chest 07/02/2019-stable size of bilateral pulmonary nodules.  Dominant cavitary lesions in both lungs show increased cavitation with thinner walls.  Stable 2.1 cm right adrenal nodule. ?Cycle 6 FOLFIRINOX 07/06/2019 ?Cycle 7 FOLFIRINOX 07/21/2019 ?Cycle 8 FOLFIRINOX 08/03/2019, oxaliplatin deleted secondary to neuropathy ?CT chest 08/24/2019-decreased size of several lung nodules with resolution of a left upper lobe nodule, no new nodules ?Radiation to the pancreas surgical area with concurrent Xeloda 09/13/2019-10/20/2019  ?CTs 11/29/2019-multiple small pulmonary nodules scattered throughout the lungs bilaterally, appear increased in number and size. No definite evidence of metastatic disease in the abdomen or pelvis. Markedly enlarged and heterogeneous appearing uterus, likely to represent multifocal fibroids. 1 of these lesions appears to be an exophytic subserosal fibroid in the posterior lateral aspect of the uterine body on the left side although this comes in close proximity to the left adnexa such that a primary ovarian lesion is difficult to completely exclude. ?CTs 02/07/2020-slight enlargement of bilateral lung nodules, some are cavitary, no evidence of metastatic disease in the abdomen or pelvis, stable right adrenal nodule, uterine fibroids ?CTs 04/26/2020-mild enlargement of  pulmonary nodules, slight increase in trace pelvic fluid, new  soft tissue thickening inferior to the cecal tip suspicious for peritoneal metastasis ?CT 05/26/2020-improved appearance of soft tissue at the inferior tip of the cecum, mildly thickened short appendix-findings suggestive of resolving appendicitis, stable small bibasilar pulmonary nodules, fibroids ?Plan biopsy of right cecal tip soft tissue canceled secondary to radiologic improvement ?CT chest 08/02/2020-enlargement and progressive cavitation of multiple bilateral lung nodules.  Some new nodules are present. ?CTs 10/24/2020- increase in size of pulmonary nodules, no new nodules, no evidence of metastatic disease in the abdomen, stable right adrenal nodule ?CT 01/09/2021-slight interval enlargement of pulmonary nodules, stable right adrenal nodule ?[facility] bronchoscopy 01/30/2021-left lower lobe cavitary nodule FNA-adenocarcinoma, brushing-adenocarcinoma.  Left lower lobe lavage-adenocarcinoma.  Right upper lobe brushing and FNA biopsy of cavitary nodule-adenocarcinoma-immunohistochemical profile consistent with pancreas adenocarcinoma ?Cycle 1 gemcitabine/Abraxane 03/28/2021 ?Cycle 2 gemcitabine/Abraxane 04/11/2021 ?Cycle 3 gemcitabine/Abraxane 04/25/2021 ?Cycle 4 gemcitabine/Abraxane 05/09/2021 ?Cycle 5 gemcitabine/Abraxane 05/23/2021 ?CT chest 06/05/2021-interval cavitation of multiple pulmonary nodules, some nodules have decreased in size, no new or enlarging nodules ?Cycle 6 gemcitabine/Abraxane 06/06/2021 ?Cycle 7 gemcitabine/Abraxane 06/21/2021 ?Cycle 8 gemcitabine/Abraxane 07/05/2021 ?Cycle 9 Gemcitabine/Abraxane 07/19/2021 ?Cycle 10 gemcitabine 08/01/2021-Abraxane held secondary to neuropathy ?CT chest 08/13/2021-mild decrease in size and wall thickness of multiple cavitary nodules, no new or progressive disease in the chest, indeterminate low-attenuation right liver lesions ?Cycle 11 gemcitabine 08/16/2021-Abraxane held secondary to neuropathy ?Cycle 12  gemcitabine 08/30/2021-Abraxane held secondary to neuropathy ?  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  ? ?Disposition: Ms. Waites appears unchanged.  She has completed 11 cycles of systemic therapy.  Abraxane placed on hold 08/01/2021 due to neuropathy.  Plan to proceed with Gemcitabine alone today, continue to hold Abraxane due to persistent neuropathy symptoms. ? ?CBC and chemistry panel reviewed.  Labs adequate to proceed with Gemcitabine as scheduled 08/30/2021.  Magnesium and potassium in normal range.  ? ?She will return for follow-up in 2 weeks.  We are available to see her sooner if needed. ? ? ? ?Ned Card ANP/GNP-BC  ? ?08/29/2021  ?8:52 AM ? ? ? ? ? ? ? ?

## 2021-08-29 NOTE — Telephone Encounter (Signed)
-----   Message from Owens Shark, NP sent at 08/29/2021  9:06 AM EDT ----- ?Please send labs from today to PCP ? ?

## 2021-08-29 NOTE — Progress Notes (Signed)
Patient seen by Ned Card NP today ? ?Vitals are within treatment parameters. Weight gain of 8lbs (from last cancer center weight to today's 3/22 weight). Providers aware. Pt holding HCTZ in an effort to raise K+ and Magnesium levels.  ? ?Labs reviewed by Ned Card NP and are within treatment parameters. ? ?Per physician team, patient is ready for treatment and there are NO modifications to the treatment plan. Patient only to receive Gemzar on 08/30/2021. Abraxane continues to be on hold. Patient does not need K+ and Mag replacement due to normal levels per Ned Card NP.  ?

## 2021-08-29 NOTE — Telephone Encounter (Signed)
Faxed over labs result to Glendale Chard, MD ?

## 2021-08-30 ENCOUNTER — Inpatient Hospital Stay: Payer: BC Managed Care – PPO

## 2021-08-30 ENCOUNTER — Other Ambulatory Visit: Payer: Self-pay

## 2021-08-30 VITALS — BP 119/75 | HR 74 | Temp 97.8°F | Resp 20

## 2021-08-30 DIAGNOSIS — G620 Drug-induced polyneuropathy: Secondary | ICD-10-CM | POA: Diagnosis not present

## 2021-08-30 DIAGNOSIS — I10 Essential (primary) hypertension: Secondary | ICD-10-CM | POA: Diagnosis not present

## 2021-08-30 DIAGNOSIS — C250 Malignant neoplasm of head of pancreas: Secondary | ICD-10-CM

## 2021-08-30 DIAGNOSIS — T451X5A Adverse effect of antineoplastic and immunosuppressive drugs, initial encounter: Secondary | ICD-10-CM | POA: Diagnosis not present

## 2021-08-30 DIAGNOSIS — R918 Other nonspecific abnormal finding of lung field: Secondary | ICD-10-CM | POA: Diagnosis not present

## 2021-08-30 DIAGNOSIS — Z905 Acquired absence of kidney: Secondary | ICD-10-CM | POA: Diagnosis not present

## 2021-08-30 DIAGNOSIS — E119 Type 2 diabetes mellitus without complications: Secondary | ICD-10-CM | POA: Diagnosis not present

## 2021-08-30 DIAGNOSIS — Z79899 Other long term (current) drug therapy: Secondary | ICD-10-CM | POA: Diagnosis not present

## 2021-08-30 DIAGNOSIS — Z5111 Encounter for antineoplastic chemotherapy: Secondary | ICD-10-CM | POA: Diagnosis not present

## 2021-08-30 LAB — CANCER ANTIGEN 19-9: CA 19-9: 17 U/mL (ref 0–35)

## 2021-08-30 MED ORDER — SODIUM CHLORIDE 0.9 % IV SOLN
1000.0000 mg/m2 | Freq: Once | INTRAVENOUS | Status: AC
  Administered 2021-08-30: 1976 mg via INTRAVENOUS
  Filled 2021-08-30: qty 51.97

## 2021-08-30 MED ORDER — PROCHLORPERAZINE MALEATE 10 MG PO TABS
10.0000 mg | ORAL_TABLET | Freq: Once | ORAL | Status: AC
  Administered 2021-08-30: 10 mg via ORAL
  Filled 2021-08-30: qty 1

## 2021-08-30 MED ORDER — SODIUM CHLORIDE 0.9% FLUSH
10.0000 mL | INTRAVENOUS | Status: DC | PRN
  Administered 2021-08-30: 10 mL

## 2021-08-30 MED ORDER — HEPARIN SOD (PORK) LOCK FLUSH 100 UNIT/ML IV SOLN
500.0000 [IU] | Freq: Once | INTRAVENOUS | Status: AC | PRN
  Administered 2021-08-30: 500 [IU]

## 2021-08-30 MED ORDER — SODIUM CHLORIDE 0.9 % IV SOLN: Freq: Once | INTRAVENOUS | Status: AC

## 2021-09-03 ENCOUNTER — Encounter: Payer: Self-pay | Admitting: Internal Medicine

## 2021-09-08 ENCOUNTER — Other Ambulatory Visit: Payer: Self-pay | Admitting: Oncology

## 2021-09-10 ENCOUNTER — Other Ambulatory Visit: Payer: Self-pay | Admitting: Internal Medicine

## 2021-09-10 ENCOUNTER — Encounter: Payer: Self-pay | Admitting: Internal Medicine

## 2021-09-11 ENCOUNTER — Other Ambulatory Visit: Payer: Self-pay | Admitting: Internal Medicine

## 2021-09-11 MED ORDER — VALSARTAN 80 MG PO TABS: 80.0000 mg | ORAL_TABLET | Freq: Every day | ORAL | 2 refills | Status: DC

## 2021-09-11 MED ORDER — HYDROCHLOROTHIAZIDE 12.5 MG PO TABS: ORAL_TABLET | ORAL | 0 refills | Status: DC

## 2021-09-13 ENCOUNTER — Inpatient Hospital Stay: Payer: BC Managed Care – PPO | Attending: Genetic Counselor

## 2021-09-13 ENCOUNTER — Inpatient Hospital Stay: Payer: BC Managed Care – PPO | Admitting: Oncology

## 2021-09-13 ENCOUNTER — Inpatient Hospital Stay: Payer: BC Managed Care – PPO

## 2021-09-13 ENCOUNTER — Encounter: Payer: Self-pay | Admitting: *Deleted

## 2021-09-13 VITALS — BP 132/85 | HR 73 | Temp 97.8°F | Resp 18 | Ht 67.8 in | Wt 194.2 lb

## 2021-09-13 DIAGNOSIS — C250 Malignant neoplasm of head of pancreas: Secondary | ICD-10-CM

## 2021-09-13 DIAGNOSIS — C7800 Secondary malignant neoplasm of unspecified lung: Secondary | ICD-10-CM | POA: Diagnosis not present

## 2021-09-13 DIAGNOSIS — Z5111 Encounter for antineoplastic chemotherapy: Secondary | ICD-10-CM | POA: Insufficient documentation

## 2021-09-13 LAB — CMP (CANCER CENTER ONLY)
ALT: 40 U/L (ref 0–44)
AST: 37 U/L (ref 15–41)
Albumin: 3.1 g/dL — ABNORMAL LOW (ref 3.5–5.0)
Alkaline Phosphatase: 231 U/L — ABNORMAL HIGH (ref 38–126)
Anion gap: 7 (ref 5–15)
BUN: 5 mg/dL — ABNORMAL LOW (ref 6–20)
CO2: 26 mmol/L (ref 22–32)
Calcium: 8.6 mg/dL — ABNORMAL LOW (ref 8.9–10.3)
Chloride: 110 mmol/L (ref 98–111)
Creatinine: 0.95 mg/dL (ref 0.44–1.00)
GFR, Estimated: >60 mL/min (ref >60)
Glucose, Bld: 90 mg/dL (ref 70–99)
Potassium: 3.4 mmol/L — ABNORMAL LOW (ref 3.5–5.1)
Sodium: 143 mmol/L (ref 135–145)
Total Bilirubin: 0.4 mg/dL (ref 0.3–1.2)
Total Protein: 6.1 g/dL — ABNORMAL LOW (ref 6.5–8.1)

## 2021-09-13 LAB — CBC WITH DIFFERENTIAL (CANCER CENTER ONLY)
Abs Immature Granulocytes: 0.01 10*3/uL (ref 0.00–0.07)
Basophils Absolute: 0 10*3/uL (ref 0.0–0.1)
Basophils Relative: 1 %
Eosinophils Absolute: 0.1 10*3/uL (ref 0.0–0.5)
Eosinophils Relative: 4 %
HCT: 27.5 % — ABNORMAL LOW (ref 36.0–46.0)
Hemoglobin: 8.3 g/dL — ABNORMAL LOW (ref 12.0–15.0)
Immature Granulocytes: 0 %
Lymphocytes Relative: 30 %
Lymphs Abs: 1 10*3/uL (ref 0.7–4.0)
MCH: 28.7 pg (ref 26.0–34.0)
MCHC: 30.2 g/dL (ref 30.0–36.0)
MCV: 95.2 fL (ref 80.0–100.0)
Monocytes Absolute: 0.5 10*3/uL (ref 0.1–1.0)
Monocytes Relative: 13 %
Neutro Abs: 1.8 10*3/uL (ref 1.7–7.7)
Neutrophils Relative %: 52 %
Platelet Count: 279 10*3/uL (ref 150–400)
RBC: 2.89 MIL/uL — ABNORMAL LOW (ref 3.87–5.11)
RDW: 18.5 % — ABNORMAL HIGH (ref 11.5–15.5)
WBC Count: 3.4 10*3/uL — ABNORMAL LOW (ref 4.0–10.5)
nRBC: 0 % (ref 0.0–0.2)

## 2021-09-13 LAB — MAGNESIUM: Magnesium: 1.8 mg/dL (ref 1.7–2.4)

## 2021-09-13 MED ORDER — PROCHLORPERAZINE MALEATE 10 MG PO TABS
10.0000 mg | ORAL_TABLET | Freq: Once | ORAL | Status: AC
  Administered 2021-09-13: 10 mg via ORAL
  Filled 2021-09-13: qty 1

## 2021-09-13 MED ORDER — SODIUM CHLORIDE 0.9 % IV SOLN: Freq: Once | INTRAVENOUS | Status: AC

## 2021-09-13 MED ORDER — SODIUM CHLORIDE 0.9% FLUSH
10.0000 mL | INTRAVENOUS | Status: DC | PRN
  Administered 2021-09-13: 10 mL

## 2021-09-13 MED ORDER — HEPARIN SOD (PORK) LOCK FLUSH 100 UNIT/ML IV SOLN
500.0000 [IU] | Freq: Once | INTRAVENOUS | Status: AC | PRN
  Administered 2021-09-13: 500 [IU]

## 2021-09-13 MED ORDER — SODIUM CHLORIDE 0.9 % IV SOLN
1000.0000 mg/m2 | Freq: Once | INTRAVENOUS | Status: AC
  Administered 2021-09-13: 1976 mg via INTRAVENOUS
  Filled 2021-09-13: qty 51.97

## 2021-09-13 NOTE — Progress Notes (Deleted)
?[place] ?OFFICE PROGRESS NOTE ? ? ?Diagnosis:  ? ?INTERVAL HISTORY:  ? ?*** ? ?Objective: ? ?Vital signs in last 24 hours: ? ?Blood pressure 132/85, pulse 73, temperature 97.8 ?F (36.6 ?C), temperature source Oral, resp. rate 18, height 5' 7.8" (1.722 m), weight 194 lb 3.2 oz (88.1 kg), last menstrual period 09/17/2012, SpO2 98 %. ?  ? ?HEENT: *** ?Lymphatics: *** ?Resp: *** ?Cardio: *** ?GI: *** ?Vascular: *** ?Neuro:***  ?Skin:***  ? ?Portacath/PICC-without erythema ? ?Lab Results: ? ?Lab Results  ?Component Value Date  ? WBC 3.4 (L) 09/13/2021  ? HGB 8.3 (L) 09/13/2021  ? HCT 27.5 (L) 09/13/2021  ? MCV 95.2 09/13/2021  ? PLT 279 09/13/2021  ? NEUTROABS 1.8 09/13/2021  ? ? ?CMP  ?Lab Results  ?Component Value Date  ? NA 141 08/29/2021  ? K 4.0 08/29/2021  ? CL 113 (H) 08/29/2021  ? CO2 24 08/29/2021  ? GLUCOSE 150 (H) 08/29/2021  ? BUN 6 08/29/2021  ? CREATININE 1.12 (H) 08/29/2021  ? CALCIUM 8.6 (L) 08/29/2021  ? PROT 6.5 08/29/2021  ? ALBUMIN 3.0 (L) 08/29/2021  ? AST 35 08/29/2021  ? ALT 34 08/29/2021  ? ALKPHOS 188 (H) 08/29/2021  ? BILITOT 0.3 08/29/2021  ? GFRNONAA 58 (L) 08/29/2021  ? GFRAA >60 02/07/2020  ? ? ?Lab Results  ?Component Value Date  ? OIB704 17 08/29/2021  ? ? ?Lab Results  ?Component Value Date  ? INR 1.1 05/26/2020  ? LABPROT 13.5 05/26/2020  ? ? ?Imaging: ? ?No results found. ? ?Medications: I have reviewed the patient's current medications. ? ? 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 ?CTs 01/29/2019-ill-defined pancreas head mass, 5 pulmonary nodules-1 with a small amount of central cavitation, tumor abuts the left margin of the portal vein  indistinct density surrounding, hepatic artery, complex cystic lesion of the right kidney, right adrenal mass-characterized as an adenoma on a [facility] MRI 12/21/2018 ?Netspot 03/03/2019-no focal pancreas activity, no tracer accumulation within the suspicious pulmonary nodules, left uterine mass with tracer accumulation felt to represent a leiomyoma ?Elevated preoperative CA 19-9--CA 19-9 186 on 01/18/2019 ?CT chest 04/16/2019-multiple bilateral pulmonary nodules, some with increased cavitation, stable in size ?Cycle 1 FOLFIRINOX 04/27/2019 ?Cycle 2 FOLFIRINOX 05/11/2019 ?Cycle 3 FOLFIRINOX 05/23/2019 ?Cycle 4 FOLFIRINOX 06/08/2019 ?Cycle 5 FOLFIRINOX 06/22/2019 ?CT chest 07/02/2019-stable size of bilateral pulmonary nodules.  Dominant cavitary lesions in both lungs show increased cavitation with thinner walls.  Stable 2.1 cm right adrenal nodule. ?Cycle 6 FOLFIRINOX 07/06/2019 ?Cycle 7 FOLFIRINOX 07/21/2019 ?Cycle 8 FOLFIRINOX 08/03/2019, oxaliplatin deleted secondary to neuropathy ?CT chest 08/24/2019-decreased size of several lung nodules with resolution of a left upper lobe nodule, no new nodules ?Radiation to the pancreas surgical area with concurrent Xeloda 09/13/2019-10/20/2019  ?CTs 11/29/2019-multiple small pulmonary nodules scattered throughout the lungs bilaterally, appear increased in number and size. No definite evidence of metastatic disease in the abdomen or pelvis. Markedly enlarged and heterogeneous appearing uterus, likely to represent multifocal fibroids. 1 of these lesions appears to be an exophytic subserosal fibroid in the posterior lateral aspect of the uterine body on the left side although this comes in close proximity to the left adnexa such that a primary ovarian lesion is difficult to completely exclude. ?CTs 02/07/2020-slight enlargement of bilateral  lung nodules, some are cavitary, no evidence of metastatic disease in the abdomen or pelvis, stable right adrenal nodule, uterine fibroids ?CTs  04/26/2020-mild enlargement of pulmonary nodules, slight increase in trace pelvic fluid, new soft tissue thickening inferior to the cecal tip suspicious for peritoneal metastasis ?CT 05/26/2020-improved appearance of soft tissue at the inferior tip of the cecum, mildly thickened short appendix-findings suggestive of resolving appendicitis, stable small bibasilar pulmonary nodules, fibroids ?Plan biopsy of right cecal tip soft tissue canceled secondary to radiologic improvement ?CT chest 08/02/2020-enlargement and progressive cavitation of multiple bilateral lung nodules.  Some new nodules are present. ?CTs 10/24/2020- increase in size of pulmonary nodules, no new nodules, no evidence of metastatic disease in the abdomen, stable right adrenal nodule ?CT 01/09/2021-slight interval enlargement of pulmonary nodules, stable right adrenal nodule ?Navigation bronchoscopy 01/30/2021-left lower lobe cavitary nodule FNA-adenocarcinoma, brushing-adenocarcinoma.  Left lower lobe lavage-adenocarcinoma.  Right upper lobe brushing and FNA biopsy of cavitary nodule-adenocarcinoma-immunohistochemical profile consistent with pancreas adenocarcinoma ?Cycle 1 gemcitabine/Abraxane 03/28/2021 ?Cycle 2 gemcitabine/Abraxane 04/11/2021 ?Cycle 3 gemcitabine/Abraxane 04/25/2021 ?Cycle 4 gemcitabine/Abraxane 05/09/2021 ?Cycle 5 gemcitabine/Abraxane 05/23/2021 ?CT chest 06/05/2021-interval cavitation of multiple pulmonary nodules, some nodules have decreased in size, no new or enlarging nodules ?Cycle 6 gemcitabine/Abraxane 06/06/2021 ?Cycle 7 gemcitabine/Abraxane 06/21/2021 ?Cycle 8 gemcitabine/Abraxane 07/05/2021 ?Cycle 9 Gemcitabine/Abraxane 07/19/2021 ?Cycle 10 gemcitabine 08/01/2021-Abraxane held secondary to neuropathy ?CT chest 08/13/2021-mild decrease in size and wall thickness of multiple cavitary nodules, no new or progressive disease in the chest, indeterminate low-attenuation right liver lesions ?Cycle 11 gemcitabine 08/16/2021-Abraxane held  secondary to neuropathy ?Cycle 12 gemcitabine 08/30/2021-Abraxane held secondary to neuropathy ?  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  ? ? ? ?Disposition: ?*** ? ?[name], MD ? ?09/13/2021  ?8:51 AM ? ? ?

## 2021-09-13 NOTE — Patient Instructions (Signed)
[facility]   ?Discharge Instructions: ?Thank you for choosing [facility] to provide your oncology and hematology care.  ? ?If you have a lab appointment with the [facility], please go directly to the [facility] and check in at the registration area. ?  ?Wear comfortable clothing and clothing appropriate for easy access to any Portacath or PICC line.  ? ?We strive to give you quality time with your provider. You may need to reschedule your appointment if you arrive late (15 or more minutes).  Arriving late affects you and other patients whose appointments are after yours.  Also, if you miss three or more appointments without notifying the office, you may be dismissed from the clinic at the provider?s discretion.    ?  ?For prescription refill requests, have your pharmacy contact our office and allow 72 hours for refills to be completed.   ? ?Today you received the following chemotherapy and/or immunotherapy agents Gemzar    ?  ?To help prevent nausea and vomiting after your treatment, we encourage you to take your nausea medication as directed. ? ?BELOW ARE SYMPTOMS THAT SHOULD BE REPORTED IMMEDIATELY: ?*FEVER GREATER THAN 100.4 F (38 ?C) OR HIGHER ?*CHILLS OR SWEATING ?*NAUSEA AND VOMITING THAT IS NOT CONTROLLED WITH YOUR NAUSEA MEDICATION ?*UNUSUAL SHORTNESS OF BREATH ?*UNUSUAL BRUISING OR BLEEDING ?*URINARY PROBLEMS (pain or burning when urinating, or frequent urination) ?*BOWEL PROBLEMS (unusual diarrhea, constipation, pain near the anus) ?TENDERNESS IN MOUTH AND THROAT WITH OR WITHOUT PRESENCE OF ULCERS (sore throat, sores in mouth, or a toothache) ?UNUSUAL RASH, SWELLING OR PAIN  ?UNUSUAL VAGINAL DISCHARGE OR ITCHING  ? ?Items with * indicate a potential emergency and should be followed up as soon as possible or go to the Emergency Department if any problems should occur. ? ?Please show the CHEMOTHERAPY ALERT CARD or IMMUNOTHERAPY ALERT CARD at check-in to the  Emergency Department and triage nurse. ? ?Should you have questions after your visit or need to cancel or reschedule your appointment, please contact [facility]  Dept: [phone]  and follow the prompts.  Office hours are 8:00 a.m. to 4:30 p.m. Monday - Friday. Please note that voicemails left after 4:00 p.m. may not be returned until the following business day.  We are closed weekends and major holidays. You have access to a nurse at all times for urgent questions. Please call the main number to the clinic Dept: [phone] and follow the prompts. ? ? ?For any non-urgent questions, you may also contact your provider using MyChart. We now offer e-Visits for anyone 51 and older to request care online for non-urgent symptoms. For details visit [email or web address]. ?  ?Also download the MyChart app! Go to the app store, search "MyChart", open the app, select [facility], and log in with your MyChart username and password. ? ?Due to Covid, a mask is required upon entering the hospital/clinic. If you do not have a mask, one will be given to you upon arrival. For doctor visits, patients may have 1 support person aged 75 or older with them. For treatment visits, patients cannot have anyone with them due to current Covid guidelines and our immunocompromised population.  ? ?Gemcitabine injection ?What is this medication? ?GEMCITABINE (jem SYE ta been) is a chemotherapy drug. This medicine is used to treat many types of cancer like breast cancer, lung cancer, pancreatic cancer, and ovarian cancer. ?This medicine may be used for other purposes; ask your health care provider  or pharmacist if you have questions. ?COMMON BRAND NAME(S): Gemzar, Infugem ?What should I tell my care team before I take this medication? ?They need to know if you have any of these conditions: ?blood disorders ?infection ?kidney disease ?liver disease ?lung or breathing disease, like asthma ?recent or ongoing radiation  therapy ?an unusual or allergic reaction to gemcitabine, other chemotherapy, other medicines, foods, dyes, or preservatives ?pregnant or trying to get pregnant ?breast-feeding ?How should I use this medication? ?This drug is given as an infusion into a vein. It is administered in a hospital or clinic by a specially trained health care professional. ?Talk to your pediatrician regarding the use of this medicine in children. Special care may be needed. ?Overdosage: If you think you have taken too much of this medicine contact a poison control center or emergency room at once. ?NOTE: This medicine is only for you. Do not share this medicine with others. ?What if I miss a dose? ?It is important not to miss your dose. Call your doctor or health care professional if you are unable to keep an appointment. ?What may interact with this medication? ?medicines to increase blood counts like filgrastim, pegfilgrastim, sargramostim ?some other chemotherapy drugs like cisplatin ?vaccines ?Talk to your doctor or health care professional before taking any of these medicines: ?acetaminophen ?aspirin ?ibuprofen ?ketoprofen ?naproxen ?This list may not describe all possible interactions. Give your health care provider a list of all the medicines, herbs, non-prescription drugs, or dietary supplements you use. Also tell them if you smoke, drink alcohol, or use illegal drugs. Some items may interact with your medicine. ?What should I watch for while using this medication? ?Visit your doctor for checks on your progress. This drug may make you feel generally unwell. This is not uncommon, as chemotherapy can affect healthy cells as well as cancer cells. Report any side effects. Continue your course of treatment even though you feel ill unless your doctor tells you to stop. ?In some cases, you may be given additional medicines to help with side effects. Follow all directions for their use. ?Call your doctor or health care professional for  advice if you get a fever, chills or sore throat, or other symptoms of a cold or flu. Do not treat yourself. This drug decreases your body's ability to fight infections. Try to avoid being around people who are sick. ?This medicine may increase your risk to bruise or bleed. Call your doctor or health care professional if you notice any unusual bleeding. ?Be careful brushing and flossing your teeth or using a toothpick because you may get an infection or bleed more easily. If you have any dental work done, tell your dentist you are receiving this medicine. ?Avoid taking products that contain aspirin, acetaminophen, ibuprofen, naproxen, or ketoprofen unless instructed by your doctor. These medicines may hide a fever. ?Do not become pregnant while taking this medicine or for 6 months after stopping it. Women should inform their doctor if they wish to become pregnant or think they might be pregnant. Men should not father a child while taking this medicine and for 3 months after stopping it. There is a potential for serious side effects to an unborn child. Talk to your health care professional or pharmacist for more information. Do not breast-feed an infant while taking this medicine or for at least 1 week after stopping it. ?Men should inform their doctors if they wish to father a child. This medicine may lower sperm counts. Talk with your doctor or health  care professional if you are concerned about your fertility. ?What side effects may I notice from receiving this medication? ?Side effects that you should report to your doctor or health care professional as soon as possible: ?allergic reactions like skin rash, itching or hives, swelling of the face, lips, or tongue ?breathing problems ?pain, redness, or irritation at site where injected ?signs and symptoms of a dangerous change in heartbeat or heart rhythm like chest pain; dizziness; fast or irregular heartbeat; palpitations; feeling faint or lightheaded, falls;  breathing problems ?signs of decreased platelets or bleeding - bruising, pinpoint red spots on the skin, black, tarry stools, blood in the urine ?signs of decreased red blood cells - unusually weak or tired, f

## 2021-09-13 NOTE — Progress Notes (Signed)
?[facility] ?OFFICE PROGRESS NOTE ? ? ?Diagnosis: Pancreas cancer ? ?INTERVAL HISTORY:  ? ?Alyssa Ross returns as scheduled.  She completed another treatment with gemcitabine on 08/30/2021.  No rash or fever.  Good appetite.  She developed fluid retention after discontinuing HCTZ.  She resumed HCTZ yesterday and has lost 4 pounds. ?She continues potassium and magnesium supplements.  She is taking the potassium twice daily. ?She continues to have neuropathy symptoms in the hands and feet. ?Objective: ? ?Vital signs in last 24 hours: ? ?Blood pressure 132/85, pulse 73, temperature 97.8 ?F (36.6 ?C), temperature source Oral, resp. rate 18, height 5' 7.8" (1.722 m), weight 194 lb 3.2 oz (88.1 kg), last menstrual period 09/17/2012, SpO2 98 %. ?  ? ?HEENT: No thrush or ulcers ?Resp: Lungs clear bilaterally ?Cardio: Regular rate and rhythm ?GI: Nontender, no hepatosplenomegaly ?Vascular: Trace edema at the left greater than right lower leg ?  ? ?Portacath/PICC-without erythema ? ?Lab Results: ? ?Lab Results  ?Component Value Date  ? WBC 3.4 (L) 09/13/2021  ? HGB 8.3 (L) 09/13/2021  ? HCT 27.5 (L) 09/13/2021  ? MCV 95.2 09/13/2021  ? PLT 279 09/13/2021  ? NEUTROABS 1.8 09/13/2021  ? ? ?CMP  ?Lab Results  ?Component Value Date  ? NA 143 09/13/2021  ? K 3.4 (L) 09/13/2021  ? CL 110 09/13/2021  ? CO2 26 09/13/2021  ? GLUCOSE 90 09/13/2021  ? BUN 5 (L) 09/13/2021  ? CREATININE 0.95 09/13/2021  ? CALCIUM 8.6 (L) 09/13/2021  ? PROT 6.1 (L) 09/13/2021  ? ALBUMIN 3.1 (L) 09/13/2021  ? AST 37 09/13/2021  ? ALT 40 09/13/2021  ? ALKPHOS 231 (H) 09/13/2021  ? BILITOT 0.4 09/13/2021  ? GFRNONAA >60 09/13/2021  ? GFRAA >60 02/07/2020  ? ? ?Lab Results  ?Component Value Date  ? ATF573 17 08/29/2021  ? ? ?Medications: I have reviewed the patient's current medications. ? ? 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 ?Tumor invades the duodenal wall and vascular groove,  resection margins negative, 4/34 lymph nodes positive ?MSI-stable, tumor showed instability in 2 loci as did adjacent normal tissue ?EUS FNA biopsy of pancreas mass on 07/03/2018-well-differentiated neuroendocrine tumor ?CTs 01/29/2019-ill-defined pancreas head mass, 5 pulmonary nodules-1 with a small amount of central cavitation, tumor abuts the left margin of the portal vein indistinct density surrounding, hepatic artery, complex cystic lesion of the right kidney, right adrenal mass-characterized as an adenoma on a [facility] MRI 12/21/2018 ?Netspot 03/03/2019-no focal pancreas activity, no tracer accumulation within the suspicious pulmonary nodules, left uterine mass with tracer accumulation felt to represent a leiomyoma ?Elevated preoperative CA 19-9--CA 19-9 186 on 01/18/2019 ?CT chest 04/16/2019-multiple bilateral pulmonary nodules, some with increased cavitation, stable in size ?Cycle 1 FOLFIRINOX 04/27/2019 ?Cycle 2 FOLFIRINOX 05/11/2019 ?Cycle 3 FOLFIRINOX 05/23/2019 ?Cycle 4 FOLFIRINOX 06/08/2019 ?Cycle 5 FOLFIRINOX 06/22/2019 ?CT chest 07/02/2019-stable size of bilateral pulmonary nodules.  Dominant cavitary lesions in both lungs show increased cavitation with thinner walls.  Stable 2.1 cm right adrenal nodule. ?Cycle 6 FOLFIRINOX 07/06/2019 ?Cycle 7 FOLFIRINOX 07/21/2019 ?Cycle 8 FOLFIRINOX 08/03/2019, oxaliplatin deleted secondary to neuropathy ?CT chest 08/24/2019-decreased size of several lung nodules with resolution of a left upper lobe nodule, no new nodules ?Radiation to the pancreas surgical area with concurrent Xeloda 09/13/2019-10/20/2019  ?CTs 11/29/2019-multiple small pulmonary nodules scattered throughout the lungs bilaterally, appear increased in number and size. No definite evidence of metastatic disease in the abdomen or pelvis. Markedly enlarged and heterogeneous appearing uterus, likely to  represent multifocal fibroids. 1 of these lesions appears to be an exophytic subserosal fibroid in the posterior lateral  aspect of the uterine body on the left side although this comes in close proximity to the left adnexa such that a primary ovarian lesion is difficult to completely exclude. ?CTs 02/07/2020-slight enlargement of bilateral lung nodules, some are cavitary, no evidence of metastatic disease in the abdomen or pelvis, stable right adrenal nodule, uterine fibroids ?CTs 04/26/2020-mild enlargement of pulmonary nodules, slight increase in trace pelvic fluid, new soft tissue thickening inferior to the cecal tip suspicious for peritoneal metastasis ?CT 05/26/2020-improved appearance of soft tissue at the inferior tip of the cecum, mildly thickened short appendix-findings suggestive of resolving appendicitis, stable small bibasilar pulmonary nodules, fibroids ?Plan biopsy of right cecal tip soft tissue canceled secondary to radiologic improvement ?CT chest 08/02/2020-enlargement and progressive cavitation of multiple bilateral lung nodules.  Some new nodules are present. ?CTs 10/24/2020- increase in size of pulmonary nodules, no new nodules, no evidence of metastatic disease in the abdomen, stable right adrenal nodule ?CT 01/09/2021-slight interval enlargement of pulmonary nodules, stable right adrenal nodule ?Navigation bronchoscopy 01/30/2021-left lower lobe cavitary nodule FNA-adenocarcinoma, brushing-adenocarcinoma.  Left lower lobe lavage-adenocarcinoma.  Right upper lobe brushing and FNA biopsy of cavitary nodule-adenocarcinoma-immunohistochemical profile consistent with pancreas adenocarcinoma ?Cycle 1 gemcitabine/Abraxane 03/28/2021 ?Cycle 2 gemcitabine/Abraxane 04/11/2021 ?Cycle 3 gemcitabine/Abraxane 04/25/2021 ?Cycle 4 gemcitabine/Abraxane 05/09/2021 ?Cycle 5 gemcitabine/Abraxane 05/23/2021 ?CT chest 06/05/2021-interval cavitation of multiple pulmonary nodules, some nodules have decreased in size, no new or enlarging nodules ?Cycle 6 gemcitabine/Abraxane 06/06/2021 ?Cycle 7 gemcitabine/Abraxane 06/21/2021 ?Cycle 8  gemcitabine/Abraxane 07/05/2021 ?Cycle 9 Gemcitabine/Abraxane 07/19/2021 ?Cycle 10 gemcitabine 08/01/2021-Abraxane held secondary to neuropathy ?CT chest 08/13/2021-mild decrease in size and wall thickness of multiple cavitary nodules, no new or progressive disease in the chest, indeterminate low-attenuation right liver lesions ?Cycle 11 gemcitabine 08/16/2021-Abraxane held secondary to neuropathy ?Cycle 12 gemcitabine 08/30/2021-Abraxane held secondary to neuropathy ?Cycle 13 gemcitabine for 11/27/2021-Abraxane held secondary to neuropathy ?  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  ? ? ?Disposition: ?Alyssa Ross appears stable.  She is tolerating the gemcitabine well.  She has persistent neuropathy symptoms.  Abraxane will remain on hold.  She will continue treatment with gemcitabine. ?The potassium is mildly low today.  She will increase the potassium supplement to 3 times daily.  She will take HCTZ as needed for extremity edema. ? ?Alyssa Ross will return for an office visit in 2 weeks. ? ?[name], MD ? ?09/13/2021  ?9:06 AM ? ? ?

## 2021-09-13 NOTE — Progress Notes (Signed)
Patient presents for treatment. RN assessment completed along with the following: ? ?Labs/vitals reviewed - Yes, and within treatment parameters.   ?Weight within 10% of previous measurement - Yes ?Informed consent completed and reflects current therapy/intent - Yes, on date 03/28/21             ?Provider progress note reviewed - Today's provider note is not yet available. I reviewed the most recent oncology provider progress note in chart dated 08/29/21. ?Treatment/Antibody/Supportive plan reviewed - Yes, and Abraxane held today ?S&H and other orders reviewed - Yes, and there are no additional orders identified. ?Previous treatment date reviewed - Yes, and the appropriate amount of time has elapsed between treatments. ?Clinic Hand Off Received from - Cristy Friedlander, RN ? ?Patient to proceed with treatment.  ? ?

## 2021-09-13 NOTE — Progress Notes (Signed)
Patient seen by Dr. Benay Spice today ? ?Vitals are within treatment parameters. ? ?Labs reviewed by Dr. Benay Spice and are within treatment parameters. ? ?Per physician team, patient is ready for treatment. Please note that modifications are being made to the treatment plan including No Abraxane--neuropathy   ?

## 2021-09-14 LAB — CANCER ANTIGEN 19-9: CA 19-9: 15 U/mL (ref 0–35)

## 2021-09-17 ENCOUNTER — Other Ambulatory Visit: Payer: Self-pay | Admitting: Internal Medicine

## 2021-09-20 ENCOUNTER — Other Ambulatory Visit: Payer: Self-pay | Admitting: Oncology

## 2021-09-26 ENCOUNTER — Inpatient Hospital Stay: Payer: BC Managed Care – PPO

## 2021-09-26 ENCOUNTER — Inpatient Hospital Stay: Payer: BC Managed Care – PPO | Admitting: Oncology

## 2021-09-26 ENCOUNTER — Encounter: Payer: Self-pay | Admitting: *Deleted

## 2021-09-26 VITALS — BP 132/88 | HR 85 | Temp 98.1°F | Resp 18 | Ht 67.0 in | Wt 194.2 lb

## 2021-09-26 DIAGNOSIS — Z5111 Encounter for antineoplastic chemotherapy: Secondary | ICD-10-CM | POA: Diagnosis not present

## 2021-09-26 DIAGNOSIS — C250 Malignant neoplasm of head of pancreas: Secondary | ICD-10-CM

## 2021-09-26 DIAGNOSIS — C7800 Secondary malignant neoplasm of unspecified lung: Secondary | ICD-10-CM | POA: Diagnosis not present

## 2021-09-26 LAB — CMP (CANCER CENTER ONLY)
ALT: 41 U/L (ref 0–44)
AST: 68 U/L — ABNORMAL HIGH (ref 15–41)
Albumin: 3.4 g/dL — ABNORMAL LOW (ref 3.5–5.0)
Alkaline Phosphatase: 236 U/L — ABNORMAL HIGH (ref 38–126)
Anion gap: 5 (ref 5–15)
BUN: <6 mg/dL — ABNORMAL LOW (ref 6–20)
CO2: 26 mmol/L (ref 22–32)
Calcium: 8.6 mg/dL — ABNORMAL LOW (ref 8.9–10.3)
Chloride: 109 mmol/L (ref 98–111)
Creatinine: 1.19 mg/dL — ABNORMAL HIGH (ref 0.44–1.00)
GFR, Estimated: 54 mL/min — ABNORMAL LOW (ref >60)
Glucose, Bld: 147 mg/dL — ABNORMAL HIGH (ref 70–99)
Potassium: 3.9 mmol/L (ref 3.5–5.1)
Sodium: 140 mmol/L (ref 135–145)
Total Bilirubin: 0.4 mg/dL (ref 0.3–1.2)
Total Protein: 6.4 g/dL — ABNORMAL LOW (ref 6.5–8.1)

## 2021-09-26 LAB — CBC WITH DIFFERENTIAL (CANCER CENTER ONLY)
Abs Immature Granulocytes: 0.01 10*3/uL (ref 0.00–0.07)
Basophils Absolute: 0 10*3/uL (ref 0.0–0.1)
Basophils Relative: 1 %
Eosinophils Absolute: 0.1 10*3/uL (ref 0.0–0.5)
Eosinophils Relative: 3 %
HCT: 30.5 % — ABNORMAL LOW (ref 36.0–46.0)
Hemoglobin: 9.1 g/dL — ABNORMAL LOW (ref 12.0–15.0)
Immature Granulocytes: 0 %
Lymphocytes Relative: 22 %
Lymphs Abs: 1 10*3/uL (ref 0.7–4.0)
MCH: 28.5 pg (ref 26.0–34.0)
MCHC: 29.8 g/dL — ABNORMAL LOW (ref 30.0–36.0)
MCV: 95.6 fL (ref 80.0–100.0)
Monocytes Absolute: 0.5 10*3/uL (ref 0.1–1.0)
Monocytes Relative: 13 %
Neutro Abs: 2.7 10*3/uL (ref 1.7–7.7)
Neutrophils Relative %: 61 %
Platelet Count: 323 10*3/uL (ref 150–400)
RBC: 3.19 MIL/uL — ABNORMAL LOW (ref 3.87–5.11)
RDW: 17.2 % — ABNORMAL HIGH (ref 11.5–15.5)
WBC Count: 4.3 10*3/uL (ref 4.0–10.5)
nRBC: 0 % (ref 0.0–0.2)

## 2021-09-26 LAB — MAGNESIUM: Magnesium: 1.7 mg/dL (ref 1.7–2.4)

## 2021-09-26 NOTE — Progress Notes (Signed)
?[name] ?OFFICE PROGRESS NOTE ? ? ?Diagnosis: Pancreas cancer ? ?INTERVAL HISTORY:  ? ?Alyssa Ross completed another treatment with gemcitabine on 09/13/2021.  She reports improvement in leg swelling.  She takes HCTZ as needed.  She is taking potassium 3 times per day and magnesium twice daily.  She has cold sensitivity in the hands and persistent numbness in the feet.  This has not improved. ? ?Objective: ? ?Vital signs in last 24 hours: ? ?Blood pressure 132/88, pulse 85, temperature 98.1 ?F (36.7 ?C), temperature source Oral, resp. rate 18, height 5' 7"  (1.702 m), weight 194 lb 3.2 oz (88.1 kg), last menstrual period 09/17/2012, SpO2 100 %. ?  ? ?HEENT: No thrush or ulcers ?Resp: Lungs clear bilaterally ?Cardio: Regular rate and rhythm ?GI: Nontender, no hepatosplenomegaly, no mass ?Vascular: No leg edema  ?  ? ?Portacath/PICC-without erythema ? ?Lab Results: ? ?Lab Results  ?Component Value Date  ? WBC 4.3 09/26/2021  ? HGB 9.1 (L) 09/26/2021  ? HCT 30.5 (L) 09/26/2021  ? MCV 95.6 09/26/2021  ? PLT 323 09/26/2021  ? NEUTROABS 2.7 09/26/2021  ? ? ?CMP  ?Lab Results  ?Component Value Date  ? NA 143 09/13/2021  ? K 3.4 (L) 09/13/2021  ? CL 110 09/13/2021  ? CO2 26 09/13/2021  ? GLUCOSE 90 09/13/2021  ? BUN 5 (L) 09/13/2021  ? CREATININE 0.95 09/13/2021  ? CALCIUM 8.6 (L) 09/13/2021  ? PROT 6.1 (L) 09/13/2021  ? ALBUMIN 3.1 (L) 09/13/2021  ? AST 37 09/13/2021  ? ALT 40 09/13/2021  ? ALKPHOS 231 (H) 09/13/2021  ? BILITOT 0.4 09/13/2021  ? GFRNONAA >60 09/13/2021  ? GFRAA >60 02/07/2020  ? ? ?Lab Results  ?Component Value Date  ? KYH062 15 09/13/2021  ? ? ? ?Medications: I have reviewed the patient's current medications. ? ? 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 ?Tumor invades the duodenal wall and vascular groove, resection margins negative, 4/34 lymph nodes positive ?MSI-stable, tumor showed instability in 2 loci as did adjacent normal  tissue ?EUS FNA biopsy of pancreas mass on 07/03/2018-well-differentiated neuroendocrine tumor ?CTs 01/29/2019-ill-defined pancreas head mass, 5 pulmonary nodules-1 with a small amount of central cavitation, tumor abuts the left margin of the portal vein indistinct density surrounding, hepatic artery, complex cystic lesion of the right kidney, right adrenal mass-characterized as an adenoma on a [facility] MRI 12/21/2018 ?Netspot 03/03/2019-no focal pancreas activity, no tracer accumulation within the suspicious pulmonary nodules, left uterine mass with tracer accumulation felt to represent a leiomyoma ?Elevated preoperative CA 19-9--CA 19-9 186 on 01/18/2019 ?CT chest 04/16/2019-multiple bilateral pulmonary nodules, some with increased cavitation, stable in size ?Cycle 1 FOLFIRINOX 04/27/2019 ?Cycle 2 FOLFIRINOX 05/11/2019 ?Cycle 3 FOLFIRINOX 05/23/2019 ?Cycle 4 FOLFIRINOX 06/08/2019 ?Cycle 5 FOLFIRINOX 06/22/2019 ?CT chest 07/02/2019-stable size of bilateral pulmonary nodules.  Dominant cavitary lesions in both lungs show increased cavitation with thinner walls.  Stable 2.1 cm right adrenal nodule. ?Cycle 6 FOLFIRINOX 07/06/2019 ?Cycle 7 FOLFIRINOX 07/21/2019 ?Cycle 8 FOLFIRINOX 08/03/2019, oxaliplatin deleted secondary to neuropathy ?CT chest 08/24/2019-decreased size of several lung nodules with resolution of a left upper lobe nodule, no new nodules ?Radiation to the pancreas surgical area with concurrent Xeloda 09/13/2019-10/20/2019  ?CTs 11/29/2019-multiple small pulmonary nodules scattered throughout the lungs bilaterally, appear increased in number and size. No definite evidence of metastatic disease in the abdomen or pelvis. Markedly enlarged and heterogeneous appearing uterus, likely to represent multifocal fibroids. 1 of these lesions appears to be an exophytic subserosal fibroid  in the posterior lateral aspect of the uterine body on the left side although this comes in close proximity to the left adnexa such that a primary  ovarian lesion is difficult to completely exclude. ?CTs 02/07/2020-slight enlargement of bilateral lung nodules, some are cavitary, no evidence of metastatic disease in the abdomen or pelvis, stable right adrenal nodule, uterine fibroids ?CTs 04/26/2020-mild enlargement of pulmonary nodules, slight increase in trace pelvic fluid, new soft tissue thickening inferior to the cecal tip suspicious for peritoneal metastasis ?CT 05/26/2020-improved appearance of soft tissue at the inferior tip of the cecum, mildly thickened short appendix-findings suggestive of resolving appendicitis, stable small bibasilar pulmonary nodules, fibroids ?Plan biopsy of right cecal tip soft tissue canceled secondary to radiologic improvement ?CT chest 08/02/2020-enlargement and progressive cavitation of multiple bilateral lung nodules.  Some new nodules are present. ?CTs 10/24/2020- increase in size of pulmonary nodules, no new nodules, no evidence of metastatic disease in the abdomen, stable right adrenal nodule ?CT 01/09/2021-slight interval enlargement of pulmonary nodules, stable right adrenal nodule ?Navigation bronchoscopy 01/30/2021-left lower lobe cavitary nodule FNA-adenocarcinoma, brushing-adenocarcinoma.  Left lower lobe lavage-adenocarcinoma.  Right upper lobe brushing and FNA biopsy of cavitary nodule-adenocarcinoma-immunohistochemical profile consistent with pancreas adenocarcinoma ?Cycle 1 gemcitabine/Abraxane 03/28/2021 ?Cycle 2 gemcitabine/Abraxane 04/11/2021 ?Cycle 3 gemcitabine/Abraxane 04/25/2021 ?Cycle 4 gemcitabine/Abraxane 05/09/2021 ?Cycle 5 gemcitabine/Abraxane 05/23/2021 ?CT chest 06/05/2021-interval cavitation of multiple pulmonary nodules, some nodules have decreased in size, no new or enlarging nodules ?Cycle 6 gemcitabine/Abraxane 06/06/2021 ?Cycle 7 gemcitabine/Abraxane 06/21/2021 ?Cycle 8 gemcitabine/Abraxane 07/05/2021 ?Cycle 9 Gemcitabine/Abraxane 07/19/2021 ?Cycle 10 gemcitabine 08/01/2021-Abraxane held secondary to  neuropathy ?CT chest 08/13/2021-mild decrease in size and wall thickness of multiple cavitary nodules, no new or progressive disease in the chest, indeterminate low-attenuation right liver lesions ?Cycle 11 gemcitabine 08/16/2021-Abraxane held secondary to neuropathy ?Cycle 12 gemcitabine 08/30/2021-Abraxane held secondary to neuropathy ?Cycle 13 gemcitabine 09/13/2021-Abraxane held secondary to neuropathy ?Cycle 14 gemcitabine 09/27/2021-Abraxane held secondary to neuropathy ?  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  ? ? ? ?Disposition: ?Ms. Walthour appears stable.  She is tolerating the gemcitabine well.  She has persistent neuropathy symptoms.  Abraxane will remain on hold.  The CA 19-9 was normal on 09/13/2021.  She will complete another cycle of gemcitabine tomorrow.  She will return for an office visit and chemotherapy in 2 weeks. ?She will be scheduled for a restaging chest CT in May. ? ?[name], MD ? ?09/26/2021  ?12:02 PM ? ? ? ?

## 2021-09-26 NOTE — Progress Notes (Signed)
Patient seen by Dr. Benay Spice today. ? ?Vitals are within treatment parameters. ? ?Labs reviewed by Dr. Benay Spice and are within treatment parameters. ? ?Per physician team, patient is ready for treatment. Please note that modifications are being made to the treatment plan including Ready for treatment on 4/20/223--Abraxane on hold. ?Will not require electrolyte replacement.  ?

## 2021-09-27 ENCOUNTER — Ambulatory Visit: Payer: BC Managed Care – PPO

## 2021-09-27 ENCOUNTER — Inpatient Hospital Stay: Payer: BC Managed Care – PPO

## 2021-09-27 ENCOUNTER — Other Ambulatory Visit: Payer: Self-pay

## 2021-09-27 VITALS — BP 130/79 | HR 65 | Temp 98.9°F | Resp 18

## 2021-09-27 DIAGNOSIS — C250 Malignant neoplasm of head of pancreas: Secondary | ICD-10-CM | POA: Diagnosis not present

## 2021-09-27 DIAGNOSIS — Z5111 Encounter for antineoplastic chemotherapy: Secondary | ICD-10-CM | POA: Diagnosis not present

## 2021-09-27 DIAGNOSIS — C7800 Secondary malignant neoplasm of unspecified lung: Secondary | ICD-10-CM | POA: Diagnosis not present

## 2021-09-27 LAB — CANCER ANTIGEN 19-9: CA 19-9: 15 U/mL (ref 0–35)

## 2021-09-27 MED ORDER — PROCHLORPERAZINE MALEATE 10 MG PO TABS
10.0000 mg | ORAL_TABLET | Freq: Once | ORAL | Status: AC
  Administered 2021-09-27: 10 mg via ORAL
  Filled 2021-09-27: qty 1

## 2021-09-27 MED ORDER — SODIUM CHLORIDE 0.9 % IV SOLN: Freq: Once | INTRAVENOUS | Status: AC

## 2021-09-27 MED ORDER — HEPARIN SOD (PORK) LOCK FLUSH 100 UNIT/ML IV SOLN
500.0000 [IU] | Freq: Once | INTRAVENOUS | Status: AC | PRN
  Administered 2021-09-27: 500 [IU]

## 2021-09-27 MED ORDER — SODIUM CHLORIDE 0.9 % IV SOLN
1000.0000 mg/m2 | Freq: Once | INTRAVENOUS | Status: AC
  Administered 2021-09-27: 1976 mg via INTRAVENOUS
  Filled 2021-09-27: qty 51.97

## 2021-09-27 MED ORDER — SODIUM CHLORIDE 0.9% FLUSH
10.0000 mL | INTRAVENOUS | Status: DC | PRN
  Administered 2021-09-27: 10 mL

## 2021-09-27 NOTE — Patient Instructions (Signed)
[facility]   ?Discharge Instructions: ?Thank you for choosing [facility] to provide your oncology and hematology care.  ? ?If you have a lab appointment with the [facility], please go directly to the [facility] and check in at the registration area. ?  ?Wear comfortable clothing and clothing appropriate for easy access to any Portacath or PICC line.  ? ?We strive to give you quality time with your provider. You may need to reschedule your appointment if you arrive late (15 or more minutes).  Arriving late affects you and other patients whose appointments are after yours.  Also, if you miss three or more appointments without notifying the office, you may be dismissed from the clinic at the provider?s discretion.    ?  ?For prescription refill requests, have your pharmacy contact our office and allow 72 hours for refills to be completed.   ? ?Today you received the following chemotherapy and/or immunotherapy agents Gemzar    ?  ?To help prevent nausea and vomiting after your treatment, we encourage you to take your nausea medication as directed. ? ?BELOW ARE SYMPTOMS THAT SHOULD BE REPORTED IMMEDIATELY: ?*FEVER GREATER THAN 100.4 F (38 ?C) OR HIGHER ?*CHILLS OR SWEATING ?*NAUSEA AND VOMITING THAT IS NOT CONTROLLED WITH YOUR NAUSEA MEDICATION ?*UNUSUAL SHORTNESS OF BREATH ?*UNUSUAL BRUISING OR BLEEDING ?*URINARY PROBLEMS (pain or burning when urinating, or frequent urination) ?*BOWEL PROBLEMS (unusual diarrhea, constipation, pain near the anus) ?TENDERNESS IN MOUTH AND THROAT WITH OR WITHOUT PRESENCE OF ULCERS (sore throat, sores in mouth, or a toothache) ?UNUSUAL RASH, SWELLING OR PAIN  ?UNUSUAL VAGINAL DISCHARGE OR ITCHING  ? ?Items with * indicate a potential emergency and should be followed up as soon as possible or go to the Emergency Department if any problems should occur. ? ?Please show the CHEMOTHERAPY ALERT CARD or IMMUNOTHERAPY ALERT CARD at check-in to  the Emergency Department and triage nurse. ? ?Should you have questions after your visit or need to cancel or reschedule your appointment, please contact [facility]  Dept: [phone]  and follow the prompts.  Office hours are 8:00 a.m. to 4:30 p.m. Monday - Friday. Please note that voicemails left after 4:00 p.m. may not be returned until the following business day.  We are closed weekends and major holidays. You have access to a nurse at all times for urgent questions. Please call the main number to the clinic Dept: [phone] and follow the prompts. ? ? ?For any non-urgent questions, you may also contact your provider using MyChart. We now offer e-Visits for anyone 81 and older to request care online for non-urgent symptoms. For details visit [email or web address]. ?  ?Also download the MyChart app! Go to the app store, search "MyChart", open the app, select [facility], and log in with your MyChart username and password. ? ?Due to Covid, a mask is required upon entering the hospital/clinic. If you do not have a mask, one will be given to you upon arrival. For doctor visits, patients may have 1 support person aged 31 or older with them. For treatment visits, patients cannot have anyone with them due to current Covid guidelines and our immunocompromised population.  ? ?Gemcitabine injection ?What is this medication? ?GEMCITABINE (jem SYE ta been) is a chemotherapy drug. This medicine is used to treat many types of cancer like breast cancer, lung cancer, pancreatic cancer, and ovarian cancer. ?This medicine may be used for other purposes; ask your health care provider  or pharmacist if you have questions. ?COMMON BRAND NAME(S): Gemzar, Infugem ?What should I tell my care team before I take this medication? ?They need to know if you have any of these conditions: ?blood disorders ?infection ?kidney disease ?liver disease ?lung or breathing disease, like asthma ?recent or ongoing  radiation therapy ?an unusual or allergic reaction to gemcitabine, other chemotherapy, other medicines, foods, dyes, or preservatives ?pregnant or trying to get pregnant ?breast-feeding ?How should I use this medication? ?This drug is given as an infusion into a vein. It is administered in a hospital or clinic by a specially trained health care professional. ?Talk to your pediatrician regarding the use of this medicine in children. Special care may be needed. ?Overdosage: If you think you have taken too much of this medicine contact a poison control center or emergency room at once. ?NOTE: This medicine is only for you. Do not share this medicine with others. ?What if I miss a dose? ?It is important not to miss your dose. Call your doctor or health care professional if you are unable to keep an appointment. ?What may interact with this medication? ?medicines to increase blood counts like filgrastim, pegfilgrastim, sargramostim ?some other chemotherapy drugs like cisplatin ?vaccines ?Talk to your doctor or health care professional before taking any of these medicines: ?acetaminophen ?aspirin ?ibuprofen ?ketoprofen ?naproxen ?This list may not describe all possible interactions. Give your health care provider a list of all the medicines, herbs, non-prescription drugs, or dietary supplements you use. Also tell them if you smoke, drink alcohol, or use illegal drugs. Some items may interact with your medicine. ?What should I watch for while using this medication? ?Visit your doctor for checks on your progress. This drug may make you feel generally unwell. This is not uncommon, as chemotherapy can affect healthy cells as well as cancer cells. Report any side effects. Continue your course of treatment even though you feel ill unless your doctor tells you to stop. ?In some cases, you may be given additional medicines to help with side effects. Follow all directions for their use. ?Call your doctor or health care  professional for advice if you get a fever, chills or sore throat, or other symptoms of a cold or flu. Do not treat yourself. This drug decreases your body's ability to fight infections. Try to avoid being around people who are sick. ?This medicine may increase your risk to bruise or bleed. Call your doctor or health care professional if you notice any unusual bleeding. ?Be careful brushing and flossing your teeth or using a toothpick because you may get an infection or bleed more easily. If you have any dental work done, tell your dentist you are receiving this medicine. ?Avoid taking products that contain aspirin, acetaminophen, ibuprofen, naproxen, or ketoprofen unless instructed by your doctor. These medicines may hide a fever. ?Do not become pregnant while taking this medicine or for 6 months after stopping it. Women should inform their doctor if they wish to become pregnant or think they might be pregnant. Men should not father a child while taking this medicine and for 3 months after stopping it. There is a potential for serious side effects to an unborn child. Talk to your health care professional or pharmacist for more information. Do not breast-feed an infant while taking this medicine or for at least 1 week after stopping it. ?Men should inform their doctors if they wish to father a child. This medicine may lower sperm counts. Talk with your doctor or health  care professional if you are concerned about your fertility. ?What side effects may I notice from receiving this medication? ?Side effects that you should report to your doctor or health care professional as soon as possible: ?allergic reactions like skin rash, itching or hives, swelling of the face, lips, or tongue ?breathing problems ?pain, redness, or irritation at site where injected ?signs and symptoms of a dangerous change in heartbeat or heart rhythm like chest pain; dizziness; fast or irregular heartbeat; palpitations; feeling faint or  lightheaded, falls; breathing problems ?signs of decreased platelets or bleeding - bruising, pinpoint red spots on the skin, black, tarry stools, blood in the urine ?signs of decreased red blood cells - unusually weak or ti

## 2021-10-05 ENCOUNTER — Encounter: Payer: Self-pay | Admitting: Pulmonary Disease

## 2021-10-05 NOTE — Telephone Encounter (Signed)
Email has been sent to Kathlee Nations to look into this. (See pt email from 05/07/21).  ?

## 2021-10-07 ENCOUNTER — Other Ambulatory Visit: Payer: Self-pay | Admitting: Oncology

## 2021-10-08 ENCOUNTER — Encounter: Payer: Self-pay | Admitting: Internal Medicine

## 2021-10-09 NOTE — Progress Notes (Signed)
?[facility] ?OFFICE PROGRESS NOTE ? ? ?Diagnosis: Pancreas cancer ? ?INTERVAL HISTORY:  ? ?Alyssa Ross completed another cycle of gemcitabine on 09/27/2021.  No rash or fever.  She reports mild leg swelling beginning a few days following chemotherapy.  This improved with HCTZ.  She has an acne type rash over the face after chemotherapy.  She continues working.  Neuropathy symptoms in the feet have not improved. ? ?Objective: ? ?Vital signs in last 24 hours: ? ?Blood pressure 140/90, pulse 70, temperature 98.1 ?F (36.7 ?C), temperature source Oral, resp. rate 18, height _0  (1.702 m), weight 194 lb 6.4 oz (88.2 kg), last menstrual period 09/17/2012, SpO2 98 %. ?  ? ?HEENT: No thrush or ulcers ?Resp: Lungs clear bilaterally ?Cardio: Regular rate and rhythm ?GI: No hepatosplenomegaly ?Vascular: No leg edema  ?Skin: Mild acne type rash over the face ? ?Portacath/PICC-without erythema ? ?Lab Results: ? ?Lab Results  ?Component Value Date  ? WBC 4.4 10/10/2021  ? HGB 9.1 (L) 10/10/2021  ? HCT 30.4 (L) 10/10/2021  ? MCV 94.1 10/10/2021  ? PLT 258 10/10/2021  ? NEUTROABS 2.7 10/10/2021  ? ? ?CMP  ?Lab Results  ?Component Value Date  ? NA 139 10/10/2021  ? K 3.5 10/10/2021  ? CL 109 10/10/2021  ? CO2 25 10/10/2021  ? GLUCOSE 238 (H) 10/10/2021  ? BUN 9 10/10/2021  ? CREATININE 0.92 10/10/2021  ? CALCIUM 8.4 (L) 10/10/2021  ? PROT 6.4 (L) 10/10/2021  ? ALBUMIN 3.2 (L) 10/10/2021  ? AST 34 10/10/2021  ? ALT 36 10/10/2021  ? ALKPHOS 264 (H) 10/10/2021  ? BILITOT 0.4 10/10/2021  ? GFRNONAA >60 10/10/2021  ? GFRAA >60 02/07/2020  ? ? ?Lab Results  ?Component Value Date  ? LTJ030 15 09/26/2021  ? ? ?Medications: I have reviewed the patient's current medications. ? ? 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 ?Tumor invades the duodenal wall and vascular groove, resection margins negative, 4/34 lymph nodes positive ?MSI-stable, tumor showed instability in 2  loci as did adjacent normal tissue ?EUS FNA biopsy of pancreas mass on 07/03/2018-well-differentiated neuroendocrine tumor ?CTs 01/29/2019-ill-defined pancreas head mass, 5 pulmonary nodules-1 with a small amount of central cavitation, tumor abuts the left margin of the portal vein indistinct density surrounding, hepatic artery, complex cystic lesion of the right kidney, right adrenal mass-characterized as an adenoma on a [facility] MRI 12/21/2018 ?Netspot 03/03/2019-no focal pancreas activity, no tracer accumulation within the suspicious pulmonary nodules, left uterine mass with tracer accumulation felt to represent a leiomyoma ?Elevated preoperative CA 19-9--CA 19-9 186 on 01/18/2019 ?CT chest 04/16/2019-multiple bilateral pulmonary nodules, some with increased cavitation, stable in size ?Cycle 1 FOLFIRINOX 04/27/2019 ?Cycle 2 FOLFIRINOX 05/11/2019 ?Cycle 3 FOLFIRINOX 05/23/2019 ?Cycle 4 FOLFIRINOX 06/08/2019 ?Cycle 5 FOLFIRINOX 06/22/2019 ?CT chest 07/02/2019-stable size of bilateral pulmonary nodules.  Dominant cavitary lesions in both lungs show increased cavitation with thinner walls.  Stable 2.1 cm right adrenal nodule. ?Cycle 6 FOLFIRINOX 07/06/2019 ?Cycle 7 FOLFIRINOX 07/21/2019 ?Cycle 8 FOLFIRINOX 08/03/2019, oxaliplatin deleted secondary to neuropathy ?CT chest 08/24/2019-decreased size of several lung nodules with resolution of a left upper lobe nodule, no new nodules ?Radiation to the pancreas surgical area with concurrent Xeloda 09/13/2019-10/20/2019  ?CTs 11/29/2019-multiple small pulmonary nodules scattered throughout the lungs bilaterally, appear increased in number and size. No definite evidence of metastatic disease in the abdomen or pelvis. Markedly enlarged and heterogeneous appearing uterus, likely to represent multifocal fibroids. 1 of these lesions appears to be an  exophytic subserosal fibroid in the posterior lateral aspect of the uterine body on the left side although this comes in close proximity to the left  adnexa such that a primary ovarian lesion is difficult to completely exclude. ?CTs 02/07/2020-slight enlargement of bilateral lung nodules, some are cavitary, no evidence of metastatic disease in the abdomen or pelvis, stable right adrenal nodule, uterine fibroids ?CTs 04/26/2020-mild enlargement of pulmonary nodules, slight increase in trace pelvic fluid, new soft tissue thickening inferior to the cecal tip suspicious for peritoneal metastasis ?CT 05/26/2020-improved appearance of soft tissue at the inferior tip of the cecum, mildly thickened short appendix-findings suggestive of resolving appendicitis, stable small bibasilar pulmonary nodules, fibroids ?Plan biopsy of right cecal tip soft tissue canceled secondary to radiologic improvement ?CT chest 08/02/2020-enlargement and progressive cavitation of multiple bilateral lung nodules.  Some new nodules are present. ?CTs 10/24/2020- increase in size of pulmonary nodules, no new nodules, no evidence of metastatic disease in the abdomen, stable right adrenal nodule ?CT 01/09/2021-slight interval enlargement of pulmonary nodules, stable right adrenal nodule ?Navigation bronchoscopy 01/30/2021-left lower lobe cavitary nodule FNA-adenocarcinoma, brushing-adenocarcinoma.  Left lower lobe lavage-adenocarcinoma.  Right upper lobe brushing and FNA biopsy of cavitary nodule-adenocarcinoma-immunohistochemical profile consistent with pancreas adenocarcinoma ?Cycle 1 gemcitabine/Abraxane 03/28/2021 ?Cycle 2 gemcitabine/Abraxane 04/11/2021 ?Cycle 3 gemcitabine/Abraxane 04/25/2021 ?Cycle 4 gemcitabine/Abraxane 05/09/2021 ?Cycle 5 gemcitabine/Abraxane 05/23/2021 ?CT chest 06/05/2021-interval cavitation of multiple pulmonary nodules, some nodules have decreased in size, no new or enlarging nodules ?Cycle 6 gemcitabine/Abraxane 06/06/2021 ?Cycle 7 gemcitabine/Abraxane 06/21/2021 ?Cycle 8 gemcitabine/Abraxane 07/05/2021 ?Cycle 9 Gemcitabine/Abraxane 07/19/2021 ?Cycle 10 gemcitabine  08/01/2021-Abraxane held secondary to neuropathy ?CT chest 08/13/2021-mild decrease in size and wall thickness of multiple cavitary nodules, no new or progressive disease in the chest, indeterminate low-attenuation right liver lesions ?Cycle 11 gemcitabine 08/16/2021-Abraxane held secondary to neuropathy ?Cycle 12 gemcitabine 08/30/2021-Abraxane held secondary to neuropathy ?Cycle 13 gemcitabine 09/13/2021-Abraxane held secondary to neuropathy ?Cycle 14 gemcitabine 09/27/2021-Abraxane held secondary to neuropathy ?Cycle 15 gemcitabine 10/11/2021-Abraxane held secondary to neuropathy ?  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  ? ? ? ? 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  She continues treatment with gemcitabine.  Abraxane remains on hold secondary to neuropathy.  She will complete another treatment with gemcitabine today.  The magnesium and potassium levels have improved since she decreased diuretic therapy. ?She will undergo a restaging CT evaluation 10/22/2021.  She will return for an office visit 10/24/2021. ? ?[name], MD ? ?10/10/2021  ?8:59 AM ? ? ?

## 2021-10-10 ENCOUNTER — Inpatient Hospital Stay: Payer: BC Managed Care – PPO | Admitting: Oncology

## 2021-10-10 ENCOUNTER — Encounter: Payer: Self-pay | Admitting: *Deleted

## 2021-10-10 ENCOUNTER — Inpatient Hospital Stay: Payer: BC Managed Care – PPO | Attending: Genetic Counselor

## 2021-10-10 VITALS — BP 140/90 | HR 70 | Temp 98.1°F | Resp 18 | Ht 67.0 in | Wt 194.4 lb

## 2021-10-10 DIAGNOSIS — Z905 Acquired absence of kidney: Secondary | ICD-10-CM | POA: Diagnosis not present

## 2021-10-10 DIAGNOSIS — C250 Malignant neoplasm of head of pancreas: Secondary | ICD-10-CM | POA: Diagnosis not present

## 2021-10-10 DIAGNOSIS — E876 Hypokalemia: Secondary | ICD-10-CM | POA: Diagnosis not present

## 2021-10-10 DIAGNOSIS — Z5111 Encounter for antineoplastic chemotherapy: Secondary | ICD-10-CM | POA: Diagnosis not present

## 2021-10-10 DIAGNOSIS — E119 Type 2 diabetes mellitus without complications: Secondary | ICD-10-CM | POA: Diagnosis not present

## 2021-10-10 DIAGNOSIS — G629 Polyneuropathy, unspecified: Secondary | ICD-10-CM | POA: Insufficient documentation

## 2021-10-10 DIAGNOSIS — I10 Essential (primary) hypertension: Secondary | ICD-10-CM | POA: Diagnosis not present

## 2021-10-10 LAB — CMP (CANCER CENTER ONLY)
ALT: 36 U/L (ref 0–44)
AST: 34 U/L (ref 15–41)
Albumin: 3.2 g/dL — ABNORMAL LOW (ref 3.5–5.0)
Alkaline Phosphatase: 264 U/L — ABNORMAL HIGH (ref 38–126)
Anion gap: 5 (ref 5–15)
BUN: 9 mg/dL (ref 6–20)
CO2: 25 mmol/L (ref 22–32)
Calcium: 8.4 mg/dL — ABNORMAL LOW (ref 8.9–10.3)
Chloride: 109 mmol/L (ref 98–111)
Creatinine: 0.92 mg/dL (ref 0.44–1.00)
GFR, Estimated: >60 mL/min (ref >60)
Glucose, Bld: 238 mg/dL — ABNORMAL HIGH (ref 70–99)
Potassium: 3.5 mmol/L (ref 3.5–5.1)
Sodium: 139 mmol/L (ref 135–145)
Total Bilirubin: 0.4 mg/dL (ref 0.3–1.2)
Total Protein: 6.4 g/dL — ABNORMAL LOW (ref 6.5–8.1)

## 2021-10-10 LAB — CBC WITH DIFFERENTIAL (CANCER CENTER ONLY)
Abs Immature Granulocytes: 0.02 10*3/uL (ref 0.00–0.07)
Basophils Absolute: 0 10*3/uL (ref 0.0–0.1)
Basophils Relative: 1 %
Eosinophils Absolute: 0.1 10*3/uL (ref 0.0–0.5)
Eosinophils Relative: 3 %
HCT: 30.4 % — ABNORMAL LOW (ref 36.0–46.0)
Hemoglobin: 9.1 g/dL — ABNORMAL LOW (ref 12.0–15.0)
Immature Granulocytes: 1 %
Lymphocytes Relative: 22 %
Lymphs Abs: 1 10*3/uL (ref 0.7–4.0)
MCH: 28.2 pg (ref 26.0–34.0)
MCHC: 29.9 g/dL — ABNORMAL LOW (ref 30.0–36.0)
MCV: 94.1 fL (ref 80.0–100.0)
Monocytes Absolute: 0.6 10*3/uL (ref 0.1–1.0)
Monocytes Relative: 14 %
Neutro Abs: 2.7 10*3/uL (ref 1.7–7.7)
Neutrophils Relative %: 59 %
Platelet Count: 258 10*3/uL (ref 150–400)
RBC: 3.23 MIL/uL — ABNORMAL LOW (ref 3.87–5.11)
RDW: 15.3 % (ref 11.5–15.5)
WBC Count: 4.4 10*3/uL (ref 4.0–10.5)
nRBC: 0 % (ref 0.0–0.2)

## 2021-10-10 LAB — MAGNESIUM: Magnesium: 1.7 mg/dL (ref 1.7–2.4)

## 2021-10-10 NOTE — Progress Notes (Signed)
Patient seen by Dr. Benay Spice today ? ?Vitals are within treatment parameters. ? ?Labs reviewed by Dr. Benay Spice and are within treatment parameters. ? ?Per physician team, patient is ready for treatment and there are NO modifications to the treatment plan. OK to treat on 10/11/21. ?Will continue to hold Abraxane due to neuropathy. Will not need IVF or electrolyte replacement. ?

## 2021-10-11 ENCOUNTER — Inpatient Hospital Stay: Payer: BC Managed Care – PPO

## 2021-10-11 VITALS — BP 137/90 | HR 61 | Temp 97.8°F | Resp 17

## 2021-10-11 DIAGNOSIS — G629 Polyneuropathy, unspecified: Secondary | ICD-10-CM | POA: Diagnosis not present

## 2021-10-11 DIAGNOSIS — Z905 Acquired absence of kidney: Secondary | ICD-10-CM | POA: Diagnosis not present

## 2021-10-11 DIAGNOSIS — C250 Malignant neoplasm of head of pancreas: Secondary | ICD-10-CM

## 2021-10-11 DIAGNOSIS — I10 Essential (primary) hypertension: Secondary | ICD-10-CM | POA: Diagnosis not present

## 2021-10-11 DIAGNOSIS — E119 Type 2 diabetes mellitus without complications: Secondary | ICD-10-CM | POA: Diagnosis not present

## 2021-10-11 DIAGNOSIS — E876 Hypokalemia: Secondary | ICD-10-CM | POA: Diagnosis not present

## 2021-10-11 DIAGNOSIS — Z5111 Encounter for antineoplastic chemotherapy: Secondary | ICD-10-CM | POA: Diagnosis not present

## 2021-10-11 MED ORDER — PROCHLORPERAZINE MALEATE 10 MG PO TABS
10.0000 mg | ORAL_TABLET | Freq: Once | ORAL | Status: AC
  Administered 2021-10-11: 10 mg via ORAL
  Filled 2021-10-11: qty 1

## 2021-10-11 MED ORDER — SODIUM CHLORIDE 0.9 % IV SOLN: Freq: Once | INTRAVENOUS | Status: AC

## 2021-10-11 MED ORDER — SODIUM CHLORIDE 0.9% FLUSH
10.0000 mL | INTRAVENOUS | Status: DC | PRN
  Administered 2021-10-11: 10 mL

## 2021-10-11 MED ORDER — SODIUM CHLORIDE 0.9 % IV SOLN
1000.0000 mg/m2 | Freq: Once | INTRAVENOUS | Status: AC
  Administered 2021-10-11: 1976 mg via INTRAVENOUS
  Filled 2021-10-11: qty 51.97

## 2021-10-11 MED ORDER — HEPARIN SOD (PORK) LOCK FLUSH 100 UNIT/ML IV SOLN
500.0000 [IU] | Freq: Once | INTRAVENOUS | Status: AC | PRN
  Administered 2021-10-11: 500 [IU]

## 2021-10-11 NOTE — Patient Instructions (Signed)
[facility]  Discharge Instructions: Thank you for choosing [facility] to provide your oncology and hematology care.   If you have a lab appointment with the Cancer Center, please go directly to the Cancer Center and check in at the registration area.   Wear comfortable clothing and clothing appropriate for easy access to any Portacath or PICC line.   We strive to give you quality time with your provider. You may need to reschedule your appointment if you arrive late (15 or more minutes).  Arriving late affects you and other patients whose appointments are after yours.  Also, if you miss three or more appointments without notifying the office, you may be dismissed from the clinic at the provider's discretion.      For prescription refill requests, have your pharmacy contact our office and allow 72 hours for refills to be completed.    Today you received the following chemotherapy and/or immunotherapy agents Gemzar      To help prevent nausea and vomiting after your treatment, we encourage you to take your nausea medication as directed.  BELOW ARE SYMPTOMS THAT SHOULD BE REPORTED IMMEDIATELY: *FEVER GREATER THAN 100.4 F (38 C) OR HIGHER *CHILLS OR SWEATING *NAUSEA AND VOMITING THAT IS NOT CONTROLLED WITH YOUR NAUSEA MEDICATION *UNUSUAL SHORTNESS OF BREATH *UNUSUAL BRUISING OR BLEEDING *URINARY PROBLEMS (pain or burning when urinating, or frequent urination) *BOWEL PROBLEMS (unusual diarrhea, constipation, pain near the anus) TENDERNESS IN MOUTH AND THROAT WITH OR WITHOUT PRESENCE OF ULCERS (sore throat, sores in mouth, or a toothache) UNUSUAL RASH, SWELLING OR PAIN  UNUSUAL VAGINAL DISCHARGE OR ITCHING   Items with * indicate a potential emergency and should be followed up as soon as possible or go to the Emergency Department if any problems should occur.  Please show the CHEMOTHERAPY ALERT CARD or IMMUNOTHERAPY ALERT CARD at check-in to the  Emergency Department and triage nurse.  Should you have questions after your visit or need to cancel or reschedule your appointment, please contact [facility]  Dept: [phone]  and follow the prompts.  Office hours are 8:00 a.m. to 4:30 p.m. Monday - Friday. Please note that voicemails left after 4:00 p.m. may not be returned until the following business day.  We are closed weekends and major holidays. You have access to a nurse at all times for urgent questions. Please call the main number to the clinic Dept: [phone] and follow the prompts.   For any non-urgent questions, you may also contact your provider using MyChart. We now offer e-Visits for anyone 18 and older to request care online for non-urgent symptoms. For details visit [email or web address].   Also download the MyChart app! Go to the app store, search "MyChart", open the app, select [facility], and log in with your MyChart username and password.  Due to Covid, a mask is required upon entering the hospital/clinic. If you do not have a mask, one will be given to you upon arrival. For doctor visits, patients may have 1 support person aged 18 or older with them. For treatment visits, patients cannot have anyone with them due to current Covid guidelines and our immunocompromised population.   

## 2021-10-12 DIAGNOSIS — Z8507 Personal history of malignant neoplasm of pancreas: Secondary | ICD-10-CM | POA: Diagnosis not present

## 2021-10-12 DIAGNOSIS — E119 Type 2 diabetes mellitus without complications: Secondary | ICD-10-CM | POA: Diagnosis not present

## 2021-10-12 DIAGNOSIS — E1165 Type 2 diabetes mellitus with hyperglycemia: Secondary | ICD-10-CM | POA: Diagnosis not present

## 2021-10-12 DIAGNOSIS — Z90411 Acquired partial absence of pancreas: Secondary | ICD-10-CM | POA: Diagnosis not present

## 2021-10-12 DIAGNOSIS — Z9049 Acquired absence of other specified parts of digestive tract: Secondary | ICD-10-CM | POA: Diagnosis not present

## 2021-10-12 DIAGNOSIS — Z794 Long term (current) use of insulin: Secondary | ICD-10-CM | POA: Diagnosis not present

## 2021-10-12 LAB — HEMOGLOBIN A1C: Hemoglobin A1C: 7.7

## 2021-10-16 ENCOUNTER — Other Ambulatory Visit: Payer: Self-pay | Admitting: Nurse Practitioner

## 2021-10-16 DIAGNOSIS — E876 Hypokalemia: Secondary | ICD-10-CM

## 2021-10-16 DIAGNOSIS — C250 Malignant neoplasm of head of pancreas: Secondary | ICD-10-CM

## 2021-10-17 ENCOUNTER — Encounter: Payer: Self-pay | Admitting: *Deleted

## 2021-10-17 NOTE — Progress Notes (Signed)
Per request of [employer] Case Manager, Christena Deem, faxed last office note and future appointment calendar to [phone]. ?

## 2021-10-20 ENCOUNTER — Other Ambulatory Visit: Payer: Self-pay | Admitting: Oncology

## 2021-10-22 ENCOUNTER — Emergency Department (HOSPITAL_BASED_OUTPATIENT_CLINIC_OR_DEPARTMENT_OTHER)
Admission: EM | Admit: 2021-10-22 | Discharge: 2021-10-22 | Disposition: A | Discharge status: Home / Self Care | Payer: BC Managed Care – PPO | Attending: Emergency Medicine | Admitting: Emergency Medicine

## 2021-10-22 ENCOUNTER — Inpatient Hospital Stay: Payer: BC Managed Care – PPO

## 2021-10-22 ENCOUNTER — Encounter (HOSPITAL_BASED_OUTPATIENT_CLINIC_OR_DEPARTMENT_OTHER): Payer: Self-pay

## 2021-10-22 ENCOUNTER — Other Ambulatory Visit: Payer: Self-pay

## 2021-10-22 ENCOUNTER — Emergency Department (HOSPITAL_BASED_OUTPATIENT_CLINIC_OR_DEPARTMENT_OTHER): Payer: BC Managed Care – PPO | Admitting: Radiology

## 2021-10-22 ENCOUNTER — Other Ambulatory Visit (HOSPITAL_BASED_OUTPATIENT_CLINIC_OR_DEPARTMENT_OTHER): Payer: BC Managed Care – PPO

## 2021-10-22 ENCOUNTER — Emergency Department (HOSPITAL_BASED_OUTPATIENT_CLINIC_OR_DEPARTMENT_OTHER)
Admission: RE | Admit: 2021-10-22 | Discharge: 2021-10-22 | Disposition: A | Discharge status: Home / Self Care | Payer: BC Managed Care – PPO | Source: Ambulatory Visit | Attending: Oncology | Admitting: Oncology

## 2021-10-22 DIAGNOSIS — Z8507 Personal history of malignant neoplasm of pancreas: Secondary | ICD-10-CM | POA: Insufficient documentation

## 2021-10-22 DIAGNOSIS — T82598A Other mechanical complication of other cardiac and vascular devices and implants, initial encounter: Secondary | ICD-10-CM | POA: Diagnosis not present

## 2021-10-22 DIAGNOSIS — Z452 Encounter for adjustment and management of vascular access device: Secondary | ICD-10-CM | POA: Diagnosis not present

## 2021-10-22 DIAGNOSIS — R918 Other nonspecific abnormal finding of lung field: Secondary | ICD-10-CM | POA: Diagnosis not present

## 2021-10-22 DIAGNOSIS — C250 Malignant neoplasm of head of pancreas: Secondary | ICD-10-CM | POA: Insufficient documentation

## 2021-10-22 DIAGNOSIS — Y69 Unspecified misadventure during surgical and medical care: Secondary | ICD-10-CM | POA: Insufficient documentation

## 2021-10-22 DIAGNOSIS — Z139 Encounter for screening, unspecified: Secondary | ICD-10-CM

## 2021-10-22 DIAGNOSIS — R079 Chest pain, unspecified: Secondary | ICD-10-CM | POA: Diagnosis not present

## 2021-10-22 DIAGNOSIS — Z789 Other specified health status: Secondary | ICD-10-CM

## 2021-10-22 DIAGNOSIS — Z95828 Presence of other vascular implants and grafts: Secondary | ICD-10-CM

## 2021-10-22 DIAGNOSIS — D259 Leiomyoma of uterus, unspecified: Secondary | ICD-10-CM | POA: Diagnosis not present

## 2021-10-22 MED ORDER — IOHEXOL 300 MG/ML  SOLN
100.0000 mL | Freq: Once | INTRAMUSCULAR | Status: AC | PRN
  Administered 2021-10-22: 100 mL via INTRAVENOUS

## 2021-10-22 MED ORDER — ALTEPLASE 2 MG IJ SOLR
2.0000 mg | Freq: Once | INTRAMUSCULAR | Status: AC
  Administered 2021-10-22: 2 mg
  Filled 2021-10-22: qty 2

## 2021-10-22 MED ORDER — HEPARIN SOD (PORK) LOCK FLUSH 100 UNIT/ML IV SOLN: 500.0000 [IU] | Freq: Once | INTRAVENOUS | Status: DC

## 2021-10-22 NOTE — ED Provider Notes (Signed)
?[facility] ?Provider Note ? ? ?CSN: [id number] ?Arrival date & time: 10/22/21  1846 ? ?  ? ?History ? ?Chief Complaint  ?Patient presents with  ? Vascular Access Problem  ? ? ?Alyssa Ross is a 56 y.o. female. ? ?HPI ? ?56 year old female presenting to the emergency room with concern for possible infection around her port site.  The patient has a history of pancreatic cancer and had her port accessed for imaging at the imaging center earlier today.  Staff noted redness and swelling around her port site and sent her to the emergency department for further evaluation.  She has had her PowerPort in place for the past 3 years.  She denies any chest pain or shortness of breath.  She denies any fevers or chills.  She denies any complaints at this time. ? ?Home Medications ?Prior to Admission medications   ?Medication Sig Start Date End Date Taking? Authorizing Provider  ?Continuous Blood Gluc Receiver (FREESTYLE LIBRE 14 DAY READER) DEVI USE READER TO CHECK BLOOD GLUCOSE WITH FREESTYLE LIBRE SENSORS. 12/25/17   Elayne Snare, MD  ?Continuous Blood Gluc Sensor (FREESTYLE LIBRE 2 SENSOR) MISC APPLY EVERY 14 DAYS 09/25/20   Elayne Snare, MD  ?glucose blood test strip Check sugar 2 times daily. 03/21/20   Elayne Snare, MD  ?hydrochlorothiazide (HYDRODIURIL) 12.5 MG tablet One tab po qd prn 09/11/21   Glendale Chard, MD  ?Insulin Degludec (TRESIBA FLEXTOUCH [place]) Inject into the skin. 12-18 Units    [provider]  ?insulin lispro (HUMALOG KWIKPEN) 200 UNIT/ML KwikPen 6 units with breakfast, 13 units with lunch, 16 units with dinner 05/17/21   [provider]  ?Insulin Pen Needle (BD PEN NEEDLE NANO U/F) 32G X 4 MM MISC USE TO INJECT INSULIN THREE TIMES A DAY 02/19/21   Elayne Snare, MD  ?lidocaine-prilocaine (EMLA) cream Apply 1 application topically as directed. Apply 1 hour prior to stick and cover with plastic wrap 05/23/21   Ladell Pier, MD  ?loratadine (CLARITIN) 10 MG tablet Take  10 mg by mouth at bedtime.     [provider]  ?magic mouthwash SOLN Take 5 mLs by mouth 4 (four) times daily as needed for mouth pain. ?Patient not taking: Reported on 08/29/2021 06/27/21   Ladell Pier, MD  ?MAGNESIUM-OXIDE 400 (240 Mg) MG tablet TAKE 1 TABLET(400 MG) BY MOUTH THREE TIMES DAILY 04/27/21   Ladell Pier, MD  ?potassium chloride SA (KLOR-CON M) 20 MEQ tablet Take 1 tablet (20 mEq total) by mouth 4 (four) times daily. ?Patient taking differently: Take 20 mEq by mouth 3 (three) times daily. 07/04/21   Owens Shark, NP  ?prochlorperazine (COMPAZINE) 10 MG tablet TAKE 1 TABLET(10 MG) BY MOUTH EVERY 6 HOURS AS NEEDED FOR NAUSEA OR VOMITING ?Patient not taking: Reported on 08/20/2021 04/17/21   Ladell Pier, MD  ?rosuvastatin (CRESTOR) 10 MG tablet Take 1 tablet (10 mg total) by mouth at bedtime. 08/20/21   Glendale Chard, MD  ?valsartan (DIOVAN) 80 MG tablet Take 1 tablet (80 mg total) by mouth daily. 09/11/21 09/11/22  Glendale Chard, MD  ?   ? ?Allergies    ?Cherry and Lemon oil   ? ?Review of Systems   ?Review of Systems  ?All other systems reviewed and are negative. ? ?Physical Exam ?Updated Vital Signs ?BP (!) 154/91   Pulse 91   Temp 98.9 ?F (37.2 ?C)   Resp 18   Ht 5\' 7"  (1.702 m)   Wt 88.2  kg   LMP 09/17/2012   SpO2 99%   BMI 30.45 kg/m?  ?Physical Exam ?Vitals and nursing note reviewed.  ?Constitutional:   ?   General: She is not in acute distress. ?HENT:  ?   Head: Normocephalic and atraumatic.  ?Eyes:  ?   Conjunctiva/sclera: Conjunctivae normal.  ?   Pupils: Pupils are equal, round, and reactive to light.  ?Cardiovascular:  ?   Rate and Rhythm: Normal rate and regular rhythm.  ?Pulmonary:  ?   Effort: Pulmonary effort is normal. No respiratory distress.  ?Chest:  ?   Comments: Chest wall with PowerPort in place, no surrounding tenderness or swelling, no erythema noted. ?Abdominal:  ?   General: There is no distension.  ?   Tenderness: There is no guarding.   ?Musculoskeletal:     ?   General: No deformity or signs of injury.  ?   Cervical back: Neck supple.  ?Skin: ?   Findings: No lesion or rash.  ?Neurological:  ?   General: No focal deficit present.  ?   Mental Status: She is alert. Mental status is at baseline.  ? ? ?ED Results / Procedures / Treatments   ?Labs ?(all labs ordered are listed, but only abnormal results are displayed) ?Labs Reviewed - No data to display ? ?EKG ?None ? ?Radiology ?DG Chest 2 View ? ?Result Date: 10/22/2021 ?CLINICAL DATA:  Chest pain at the port access EXAM: CHEST - 2 VIEW COMPARISON:  Chest x-ray 01/30/2021 FINDINGS: Heart size and mediastinum are stable and within normal limits. Mild calcified plaques in the aortic arch. Left-sided central venous port visualized with the tip in the proximal SVC. No focal consolidation, pleural effusion or pneumothorax identified. IMPRESSION: No acute intrathoracic process identified. Electronically Signed   By: Ofilia Neas M.D.   On: 10/22/2021 19:28  ? ?CT CHEST ABDOMEN PELVIS W CONTRAST ? ?Result Date: 10/23/2021 ?CLINICAL DATA:  Restaging pancreatic cancer. Back pain. * Tracking Code: BO * EXAM: CT CHEST, ABDOMEN, AND PELVIS WITH CONTRAST TECHNIQUE: Multidetector CT imaging of the chest, abdomen and pelvis was performed following the standard protocol during bolus administration of intravenous contrast. RADIATION DOSE REDUCTION: This exam was performed according to the departmental dose-optimization program which includes automated exposure control, adjustment of the mA and/or kV according to patient size and/or use of iterative reconstruction technique. CONTRAST:  129mL OMNIPAQUE IOHEXOL 300 MG/ML  SOLN COMPARISON:  Chest CT 08/13/2021 and 06/05/2021. CTs of the chest and abdomen 10/24/2020. Pelvic CT 05/26/2020. FINDINGS: CT CHEST FINDINGS Cardiovascular: Left subclavian Port-A-Cath extends to the superior cavoatrial junction. No acute vascular findings are seen. The heart size is normal.  There is no pericardial effusion. Mediastinum/Nodes: There are no enlarged mediastinal, hilar or axillary lymph nodes. The thyroid gland, trachea and esophagus demonstrate no significant findings. Lungs/Pleura: No pleural effusion or pneumothorax. Again demonstrated are multiple pulmonary nodules bilaterally, many of which are cavitary and thick walled. Overall, these are similar in size and distribution to the most recent prior study. For example, a left lower lobe cavitary lesion measures 1.8 x 1.7 cm on image 60/4 (previously 1.8 x 1.5 cm). Cavitary right upper lobe lesion measures 1.8 x 1.6 cm on image 66/4 (previously 1.8 x 1.4 cm). A right lower lobe lesion measures 1.0 x 1.0 cm on image 85/4 (previously 1.0 x 0.9 cm). Although there are minor fluctuations in the size of some of the nodules, no clear progression or improvement identified over the last 10 weeks. Musculoskeletal/Chest wall:  No chest wall mass or suspicious osseous findings. 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 As question on the recent noncontrast chest CT, there is a possible new ill-defined lesion posteriorly in the right hepatic lobe which measures 2.0 x 2.0 cm on image 52/2. No definite corresponding abnormality on previous contrast enhanced abdominal CT from 10/24/2020. No other suspicious lesions are identified. Pancreas: Postsurgical changes from Whipple procedure with pancreatic ductal stenting. No recurrent mass or pancreatic ductal dilatation. No surrounding inflammatory changes. Spleen: Normal in size without focal abnormality. Adrenals/Urinary Tract: Unchanged 2.1 x 1.2 cm right adrenal nodule which has a nonspecific density, but is most consistent with an incidental adenoma based on stability from multiple prior studies. The left adrenal gland appears normal. Stable mild right renal  scarring. No evidence of renal mass, urinary tract calculus or hydronephrosis. The bladder appears unremarkable. Stomach/Bowel: Enteric contrast was administered and has passed into the distal small bowel. The stomach appears unremar

## 2021-10-22 NOTE — ED Triage Notes (Signed)
Patient here POV from [facility]. ? ?Patient endorses being at [facility] to have CT Scan completed today. Port was accessed this AM and then had CT completed shortly PTA. After de-accessing the Port the Patient began to have Pain and Swelling to Same. ? ?No N/V. Port is a [employer], museum/gallery and was placed 3 years PTA. ? ?NAD Noted during Triage. A&Ox4. GCS 15. Ambulatory.  ?

## 2021-10-22 NOTE — Discharge Instructions (Addendum)
Your port site does not appear to be inflamed or infected on examination. Your CXR was normal. ?

## 2021-10-22 NOTE — ED Notes (Signed)
Pt verbalizes understanding of discharge instructions. Opportunity for questioning and answers were provided. Pt discharged from ED to home.   ? ?

## 2021-10-24 ENCOUNTER — Other Ambulatory Visit: Payer: Self-pay

## 2021-10-24 ENCOUNTER — Other Ambulatory Visit: Payer: BC Managed Care – PPO

## 2021-10-24 ENCOUNTER — Encounter: Payer: Self-pay | Admitting: *Deleted

## 2021-10-24 ENCOUNTER — Inpatient Hospital Stay: Payer: BC Managed Care – PPO

## 2021-10-24 ENCOUNTER — Inpatient Hospital Stay: Payer: BC Managed Care – PPO | Admitting: Oncology

## 2021-10-24 VITALS — BP 139/84 | HR 76 | Temp 98.2°F | Resp 18 | Ht 67.0 in | Wt 192.6 lb

## 2021-10-24 DIAGNOSIS — I10 Essential (primary) hypertension: Secondary | ICD-10-CM | POA: Diagnosis not present

## 2021-10-24 DIAGNOSIS — E119 Type 2 diabetes mellitus without complications: Secondary | ICD-10-CM | POA: Diagnosis not present

## 2021-10-24 DIAGNOSIS — C250 Malignant neoplasm of head of pancreas: Secondary | ICD-10-CM | POA: Diagnosis not present

## 2021-10-24 DIAGNOSIS — E876 Hypokalemia: Secondary | ICD-10-CM | POA: Diagnosis not present

## 2021-10-24 DIAGNOSIS — G629 Polyneuropathy, unspecified: Secondary | ICD-10-CM | POA: Diagnosis not present

## 2021-10-24 DIAGNOSIS — Z905 Acquired absence of kidney: Secondary | ICD-10-CM | POA: Diagnosis not present

## 2021-10-24 DIAGNOSIS — Z5111 Encounter for antineoplastic chemotherapy: Secondary | ICD-10-CM | POA: Diagnosis not present

## 2021-10-24 LAB — CMP (CANCER CENTER ONLY)
ALT: 33 U/L (ref 0–44)
AST: 34 U/L (ref 15–41)
Albumin: 3.4 g/dL — ABNORMAL LOW (ref 3.5–5.0)
Alkaline Phosphatase: 266 U/L — ABNORMAL HIGH (ref 38–126)
Anion gap: 6 (ref 5–15)
BUN: 8 mg/dL (ref 6–20)
CO2: 27 mmol/L (ref 22–32)
Calcium: 8.6 mg/dL — ABNORMAL LOW (ref 8.9–10.3)
Chloride: 106 mmol/L (ref 98–111)
Creatinine: 1.05 mg/dL — ABNORMAL HIGH (ref 0.44–1.00)
GFR, Estimated: >60 mL/min (ref >60)
Glucose, Bld: 237 mg/dL — ABNORMAL HIGH (ref 70–99)
Potassium: 3.1 mmol/L — ABNORMAL LOW (ref 3.5–5.1)
Sodium: 139 mmol/L (ref 135–145)
Total Bilirubin: 0.5 mg/dL (ref 0.3–1.2)
Total Protein: 6.8 g/dL (ref 6.5–8.1)

## 2021-10-24 LAB — CBC WITH DIFFERENTIAL (CANCER CENTER ONLY)
Abs Immature Granulocytes: 0.02 10*3/uL (ref 0.00–0.07)
Basophils Absolute: 0 10*3/uL (ref 0.0–0.1)
Basophils Relative: 1 %
Eosinophils Absolute: 0.1 10*3/uL (ref 0.0–0.5)
Eosinophils Relative: 2 %
HCT: 29.8 % — ABNORMAL LOW (ref 36.0–46.0)
Hemoglobin: 9.2 g/dL — ABNORMAL LOW (ref 12.0–15.0)
Immature Granulocytes: 0 %
Lymphocytes Relative: 15 %
Lymphs Abs: 0.8 10*3/uL (ref 0.7–4.0)
MCH: 28.9 pg (ref 26.0–34.0)
MCHC: 30.9 g/dL (ref 30.0–36.0)
MCV: 93.7 fL (ref 80.0–100.0)
Monocytes Absolute: 0.6 10*3/uL (ref 0.1–1.0)
Monocytes Relative: 12 %
Neutro Abs: 3.7 10*3/uL (ref 1.7–7.7)
Neutrophils Relative %: 70 %
Platelet Count: 254 10*3/uL (ref 150–400)
RBC: 3.18 MIL/uL — ABNORMAL LOW (ref 3.87–5.11)
RDW: 15 % (ref 11.5–15.5)
WBC Count: 5.3 10*3/uL (ref 4.0–10.5)
nRBC: 0 % (ref 0.0–0.2)

## 2021-10-24 LAB — MAGNESIUM: Magnesium: 1.7 mg/dL (ref 1.7–2.4)

## 2021-10-24 NOTE — Progress Notes (Signed)
?[facility] ?OFFICE PROGRESS NOTE ? ? ?Diagnosis: Pancreas cancer ? ?INTERVAL HISTORY:  ? ?Ms. Badalamenti completed another treatment with gemcitabine 10/11/2021.  She reports fluid retention beginning approximately 1 week following chemotherapy.  This resolves over the next week.  No change in neuropathy symptoms.  Good appetite.  No new complaint.  She takes HCTZ infrequently.  She continues potassium and magnesium supplements.  No diarrhea. ? ?Objective: ? ?Vital signs in last 24 hours: ? ?Blood pressure 139/84, pulse 76, temperature 98.2 ?F (36.8 ?C), temperature source Oral, resp. rate 18, height _0  (1.702 m), weight 192 lb 9.6 oz (87.4 kg), last menstrual period 09/17/2012, SpO2 100 %. ?  ? ?HEENT: No thrush or ulcers ?Resp: Lungs clear bilaterally ?Cardio: Regular rate and rhythm ?GI: No hepatosplenomegaly, nontender, no mass ?Vascular: Trace pitting edema at the lower leg bilaterally   ? ?Portacath/PICC-without erythema ? ?Lab Results: ? ?Lab Results  ?Component Value Date  ? WBC 5.3 10/24/2021  ? HGB 9.2 (L) 10/24/2021  ? HCT 29.8 (L) 10/24/2021  ? MCV 93.7 10/24/2021  ? PLT 254 10/24/2021  ? NEUTROABS 3.7 10/24/2021  ? ? ?CMP  ?Lab Results  ?Component Value Date  ? NA 139 10/24/2021  ? K 3.1 (L) 10/24/2021  ? CL 106 10/24/2021  ? CO2 27 10/24/2021  ? GLUCOSE 237 (H) 10/24/2021  ? BUN 8 10/24/2021  ? CREATININE 1.05 (H) 10/24/2021  ? CALCIUM 8.6 (L) 10/24/2021  ? PROT 6.8 10/24/2021  ? ALBUMIN 3.4 (L) 10/24/2021  ? AST 34 10/24/2021  ? ALT 33 10/24/2021  ? ALKPHOS 266 (H) 10/24/2021  ? BILITOT 0.5 10/24/2021  ? GFRNONAA >60 10/24/2021  ? GFRAA >60 02/07/2020  ? ? ?Lab Results  ?Component Value Date  ? AYT016 15 09/26/2021  ? ? ? ? ?Imaging: ? ?DG Chest 2 View ? ?Result Date: 10/22/2021 ?CLINICAL DATA:  Chest pain at the port access EXAM: CHEST - 2 VIEW COMPARISON:  Chest x-ray 01/30/2021 FINDINGS: Heart size and mediastinum are stable and within normal limits. Mild calcified plaques in the aortic  arch. Left-sided central venous port visualized with the tip in the proximal SVC. No focal consolidation, pleural effusion or pneumothorax identified. IMPRESSION: No acute intrathoracic process identified. Electronically Signed   By: Ofilia Neas M.D.   On: 10/22/2021 19:28  ? ?CT CHEST ABDOMEN PELVIS W CONTRAST ? ?Result Date: 10/23/2021 ?CLINICAL DATA:  Restaging pancreatic cancer. Back pain. * Tracking Code: BO * EXAM: CT CHEST, ABDOMEN, AND PELVIS WITH CONTRAST TECHNIQUE: Multidetector CT imaging of the chest, abdomen and pelvis was performed following the standard protocol during bolus administration of intravenous contrast. RADIATION DOSE REDUCTION: This exam was performed according to the departmental dose-optimization program which includes automated exposure control, adjustment of the mA and/or kV according to patient size and/or use of iterative reconstruction technique. CONTRAST:  163m OMNIPAQUE IOHEXOL 300 MG/ML  SOLN COMPARISON:  Chest CT 08/13/2021 and 06/05/2021. CTs of the chest and abdomen 10/24/2020. Pelvic CT 05/26/2020. FINDINGS: CT CHEST FINDINGS Cardiovascular: Left subclavian Port-A-Cath extends to the superior cavoatrial junction. No acute vascular findings are seen. The heart size is normal. There is no pericardial effusion. Mediastinum/Nodes: There are no enlarged mediastinal, hilar or axillary lymph nodes. The thyroid gland, trachea and esophagus demonstrate no significant findings. Lungs/Pleura: No pleural effusion or pneumothorax. Again demonstrated are multiple pulmonary nodules bilaterally, many of which are cavitary and thick walled. Overall, these are similar in size and distribution to the most recent prior study.  For example, a left lower lobe cavitary lesion measures 1.8 x 1.7 cm on image 60/4 (previously 1.8 x 1.5 cm). Cavitary right upper lobe lesion measures 1.8 x 1.6 cm on image 66/4 (previously 1.8 x 1.4 cm). A right lower lobe lesion measures 1.0 x 1.0 cm on image 85/4  (previously 1.0 x 0.9 cm). Although there are minor fluctuations in the size of some of the nodules, no clear progression or improvement identified over the last 10 weeks. Musculoskeletal/Chest wall: No chest wall mass or suspicious osseous findings. 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 As question on the recent noncontrast chest CT, there is a possible new ill-defined lesion posteriorly in the right hepatic lobe which measures 2.0 x 2.0 cm on image 52/2. No definite corresponding abnormality on previous contrast enhanced abdominal CT from 10/24/2020. No other suspicious lesions are identified. Pancreas: Postsurgical changes from Whipple procedure with pancreatic ductal stenting. No recurrent mass or pancreatic ductal dilatation. No surrounding inflammatory changes. Spleen: Normal in size without focal abnormality. Adrenals/Urinary Tract: Unchanged 2.1 x 1.2 cm right adrenal nodule which has a nonspecific density, but is most consistent with an incidental adenoma based on stability from multiple prior studies. The left adrenal gland appears normal. Stable mild right renal scarring. No evidence of renal mass, urinary tract calculus or hydronephrosis. The bladder appears unremarkable. Stomach/Bowel: Enteric contrast was administered and has passed into the distal small bowel. The stomach appears unremarkable for its degree of distension. No evidence of bowel wall thickening, distention or surrounding inflammatory change. Prominent stool throughout the colon. Vascular/Lymphatic: There are no enlarged abdominal or pelvic lymph nodes. No acute vascular findings. Aortic and branch vessel atherosclerosis. Reproductive: Multiple uterine fibroids are again noted, measuring up to 8.8 cm in the fundal region. No suspicious adnexal findings. Other: No ascites or peritoneal nodularity.  Musculoskeletal: No acute or significant osseous findings. IMPRESSION: 1. No significant change in multiple residual cavitary pulmonary metastases compared with the most recent prior study of 10 weeks ago. No disease progression in the chest identified. 2. As suggested on that study, there is an ill-defined hypodense lesion posteriorly in the right hepatic lobe which is not seen with certainty on previous contrast enhanced studies, suspicious for metastatic disease. Consider further evaluation with abdominal MRI with and without contrast or PET-CT. 3. No evidence of local recurrence or other signs of metastatic disease in the abdomen and pelvis status post Whipple procedure. 4. Multiple uterine fibroids. Electronically Signed   By: Richardean Sale M.D.   On: 10/23/2021 16:20   ? ?Medications: I have reviewed the patient's current medications. ? ? 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 ?CTs 01/29/2019-ill-defined pancreas head mass, 5 pulmonary nodules-1 with a small amount of central cavitation, tumor abuts the left margin of the portal vein indistinct density surrounding, hepatic artery, complex cystic lesion of the right kidney, right adrenal mass-characterized as an adenoma on a [facility] MRI 12/21/2018 ?Netspot 03/03/2019-no focal pancreas activity, no tracer accumulation within the suspicious pulmonary nodules, left uterine mass with tracer accumulation felt to represent a leiomyoma ?Elevated preoperative CA 19-9--CA 19-9 186 on 01/18/2019 ?CT chest 04/16/2019-multiple bilateral pulmonary nodules, some with increased cavitation, stable  in size ?Cycle 1 FOLFIRINOX 04/27/2019 ?Cycle 2  FOLFIRINOX 05/11/2019 ?Cycle 3 FOLFIRINOX 05/23/2019 ?Cycle 4 FOLFIRINOX 06/08/2019 ?Cycle 5 FOLFIRINOX 06/22/2019 ?CT chest 07/02/2019-stable size of bilateral pulmonary nodules.  Dominant cavitary lesions

## 2021-10-24 NOTE — Progress Notes (Signed)
Patient seen by Dr. Benay Spice today ? ?Vitals are within treatment parameters. ? ?Labs reviewed by Dr. Benay Spice and are within treatment parameters. ?K+ 3..--will receive 20 meq IV K+ at treatment on 10/25/21 ? ?Per physician team, patient is ready for treatment and there are NO modifications to the treatment plan.  ?Only receiving Gemzar and IV KCl on 10/25/21 ?

## 2021-10-25 ENCOUNTER — Inpatient Hospital Stay: Payer: BC Managed Care – PPO

## 2021-10-25 ENCOUNTER — Other Ambulatory Visit: Payer: Self-pay

## 2021-10-25 VITALS — BP 149/91 | HR 70 | Temp 98.4°F | Resp 17

## 2021-10-25 DIAGNOSIS — E876 Hypokalemia: Secondary | ICD-10-CM | POA: Diagnosis not present

## 2021-10-25 DIAGNOSIS — G629 Polyneuropathy, unspecified: Secondary | ICD-10-CM | POA: Diagnosis not present

## 2021-10-25 DIAGNOSIS — Z905 Acquired absence of kidney: Secondary | ICD-10-CM | POA: Diagnosis not present

## 2021-10-25 DIAGNOSIS — I10 Essential (primary) hypertension: Secondary | ICD-10-CM | POA: Diagnosis not present

## 2021-10-25 DIAGNOSIS — C250 Malignant neoplasm of head of pancreas: Secondary | ICD-10-CM

## 2021-10-25 DIAGNOSIS — Z5111 Encounter for antineoplastic chemotherapy: Secondary | ICD-10-CM | POA: Diagnosis not present

## 2021-10-25 DIAGNOSIS — E119 Type 2 diabetes mellitus without complications: Secondary | ICD-10-CM | POA: Diagnosis not present

## 2021-10-25 LAB — CANCER ANTIGEN 19-9: CA 19-9: 28 U/mL (ref 0–35)

## 2021-10-25 MED ORDER — SODIUM CHLORIDE 0.9 % IV SOLN
1000.0000 mg/m2 | Freq: Once | INTRAVENOUS | Status: AC
  Administered 2021-10-25: 1976 mg via INTRAVENOUS
  Filled 2021-10-25: qty 51.97

## 2021-10-25 MED ORDER — PROCHLORPERAZINE MALEATE 10 MG PO TABS
10.0000 mg | ORAL_TABLET | Freq: Once | ORAL | Status: AC
  Administered 2021-10-25: 10 mg via ORAL
  Filled 2021-10-25: qty 1

## 2021-10-25 MED ORDER — SODIUM CHLORIDE 0.9% FLUSH
10.0000 mL | INTRAVENOUS | Status: DC | PRN
  Administered 2021-10-25: 10 mL

## 2021-10-25 MED ORDER — SODIUM CHLORIDE 0.9 % IV SOLN: Freq: Once | INTRAVENOUS | Status: AC

## 2021-10-25 MED ORDER — HEPARIN SOD (PORK) LOCK FLUSH 100 UNIT/ML IV SOLN
500.0000 [IU] | Freq: Once | INTRAVENOUS | Status: AC | PRN
  Administered 2021-10-25: 500 [IU]

## 2021-10-25 MED ORDER — POTASSIUM CHLORIDE 10 MEQ/100ML IV SOLN
10.0000 meq | INTRAVENOUS | Status: AC
  Administered 2021-10-25 (×2): 10 meq via INTRAVENOUS
  Filled 2021-10-25 (×2): qty 100

## 2021-10-31 NOTE — Telephone Encounter (Signed)
Community message was sent to [employer] in coding. Alyssa Ross was asked to advise on the situation. Will await response.

## 2021-11-01 ENCOUNTER — Other Ambulatory Visit: Payer: Self-pay | Admitting: Internal Medicine

## 2021-11-01 MED ORDER — SPIRONOLACTONE 25 MG PO TABS: 25.0000 mg | ORAL_TABLET | Freq: Every day | ORAL | 11 refills | Status: DC

## 2021-11-04 ENCOUNTER — Other Ambulatory Visit: Payer: Self-pay | Admitting: Oncology

## 2021-11-07 ENCOUNTER — Inpatient Hospital Stay: Payer: BC Managed Care – PPO | Admitting: Oncology

## 2021-11-07 ENCOUNTER — Inpatient Hospital Stay: Payer: BC Managed Care – PPO

## 2021-11-07 ENCOUNTER — Encounter: Payer: Self-pay | Admitting: *Deleted

## 2021-11-07 ENCOUNTER — Other Ambulatory Visit: Payer: BC Managed Care – PPO

## 2021-11-07 ENCOUNTER — Encounter: Payer: Self-pay | Admitting: Oncology

## 2021-11-07 VITALS — BP 155/98 | HR 80 | Temp 98.1°F | Resp 18 | Ht 67.0 in | Wt 196.0 lb

## 2021-11-07 DIAGNOSIS — C250 Malignant neoplasm of head of pancreas: Secondary | ICD-10-CM | POA: Diagnosis not present

## 2021-11-07 DIAGNOSIS — E119 Type 2 diabetes mellitus without complications: Secondary | ICD-10-CM | POA: Diagnosis not present

## 2021-11-07 DIAGNOSIS — G629 Polyneuropathy, unspecified: Secondary | ICD-10-CM | POA: Diagnosis not present

## 2021-11-07 DIAGNOSIS — Z905 Acquired absence of kidney: Secondary | ICD-10-CM | POA: Diagnosis not present

## 2021-11-07 DIAGNOSIS — Z5111 Encounter for antineoplastic chemotherapy: Secondary | ICD-10-CM | POA: Diagnosis not present

## 2021-11-07 DIAGNOSIS — E876 Hypokalemia: Secondary | ICD-10-CM | POA: Diagnosis not present

## 2021-11-07 DIAGNOSIS — I10 Essential (primary) hypertension: Secondary | ICD-10-CM | POA: Diagnosis not present

## 2021-11-07 LAB — CBC WITH DIFFERENTIAL (CANCER CENTER ONLY)
Abs Immature Granulocytes: 0.01 10*3/uL (ref 0.00–0.07)
Basophils Absolute: 0 10*3/uL (ref 0.0–0.1)
Basophils Relative: 1 %
Eosinophils Absolute: 0.1 10*3/uL (ref 0.0–0.5)
Eosinophils Relative: 2 %
HCT: 30.3 % — ABNORMAL LOW (ref 36.0–46.0)
Hemoglobin: 9.2 g/dL — ABNORMAL LOW (ref 12.0–15.0)
Immature Granulocytes: 0 %
Lymphocytes Relative: 25 %
Lymphs Abs: 0.9 10*3/uL (ref 0.7–4.0)
MCH: 28 pg (ref 26.0–34.0)
MCHC: 30.4 g/dL (ref 30.0–36.0)
MCV: 92.4 fL (ref 80.0–100.0)
Monocytes Absolute: 0.5 10*3/uL (ref 0.1–1.0)
Monocytes Relative: 13 %
Neutro Abs: 2.2 10*3/uL (ref 1.7–7.7)
Neutrophils Relative %: 59 %
Platelet Count: 285 10*3/uL (ref 150–400)
RBC: 3.28 MIL/uL — ABNORMAL LOW (ref 3.87–5.11)
RDW: 14.7 % (ref 11.5–15.5)
WBC Count: 3.7 10*3/uL — ABNORMAL LOW (ref 4.0–10.5)
nRBC: 0 % (ref 0.0–0.2)

## 2021-11-07 LAB — CMP (CANCER CENTER ONLY)
ALT: 40 U/L (ref 0–44)
AST: 42 U/L — ABNORMAL HIGH (ref 15–41)
Albumin: 3.3 g/dL — ABNORMAL LOW (ref 3.5–5.0)
Alkaline Phosphatase: 308 U/L — ABNORMAL HIGH (ref 38–126)
Anion gap: 7 (ref 5–15)
BUN: 6 mg/dL (ref 6–20)
CO2: 27 mmol/L (ref 22–32)
Calcium: 8.6 mg/dL — ABNORMAL LOW (ref 8.9–10.3)
Chloride: 107 mmol/L (ref 98–111)
Creatinine: 0.95 mg/dL (ref 0.44–1.00)
GFR, Estimated: >60 mL/min (ref >60)
Glucose, Bld: 207 mg/dL — ABNORMAL HIGH (ref 70–99)
Potassium: 3.4 mmol/L — ABNORMAL LOW (ref 3.5–5.1)
Sodium: 141 mmol/L (ref 135–145)
Total Bilirubin: 0.5 mg/dL (ref 0.3–1.2)
Total Protein: 6.9 g/dL (ref 6.5–8.1)

## 2021-11-07 LAB — MAGNESIUM: Magnesium: 1.7 mg/dL (ref 1.7–2.4)

## 2021-11-07 NOTE — Progress Notes (Signed)
Patient seen by Dr. Benay Spice today  Vitals are not all within treatment parameters. Will re-check BP on 11/08/21  Labs reviewed by Dr. Benay Spice and are within treatment parameters.  Per physician team, patient is ready for treatment and there are NO modifications to the treatment plan. Will not require electrolytes on 11/08/21

## 2021-11-07 NOTE — Progress Notes (Signed)
[facility] OFFICE PROGRESS NOTE   Diagnosis: Pancreas cancer  INTERVAL HISTORY:   Alyssa Ross completed another treatment with gemcitabine 10/25/2021.  She received IV potassium 10/25/2021.  She reports stable neuropathy symptoms.  No cough.  No new complaint.  HCTZ was discontinued.  She began spironolactone on 11/02/2021.  Objective:  Vital signs in last 24 hours:  Blood pressure (!) 155/98, pulse 80, temperature 98.1 F (36.7 C), temperature source Oral, resp. rate 18, height 5' 7"  (1.702 m), weight 196 lb (88.9 kg), last menstrual period 09/17/2012, SpO2 100 %.    HEENT: No thrush or ulcers Resp: Lungs clear bilaterally Cardio: Regular rate and rhythm GI: No hepatosplenomegaly, no mass, nontender Vascular: Trace edema at the left greater than right lower leg and ankle  Skin: Mild hyperpigmentation of the hands  Portacath/PICC-without erythema  Lab Results:  Lab Results  Component Value Date   WBC 3.7 (L) 11/07/2021   HGB 9.2 (L) 11/07/2021   HCT 30.3 (L) 11/07/2021   MCV 92.4 11/07/2021   PLT 285 11/07/2021   NEUTROABS 2.2 11/07/2021    CMP  Lab Results  Component Value Date   NA 141 11/07/2021   K 3.4 (L) 11/07/2021   CL 107 11/07/2021   CO2 27 11/07/2021   GLUCOSE 207 (H) 11/07/2021   BUN 6 11/07/2021   CREATININE 0.95 11/07/2021   CALCIUM 8.6 (L) 11/07/2021   PROT 6.9 11/07/2021   ALBUMIN 3.3 (L) 11/07/2021   AST 42 (H) 11/07/2021   ALT 40 11/07/2021   ALKPHOS 308 (H) 11/07/2021   BILITOT 0.5 11/07/2021   GFRNONAA >60 11/07/2021   GFRAA >60 02/07/2020    Lab Results  Component Value Date   TGP498 28 10/24/2021    Medications: I have reviewed the patient's current medications.   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 Tumor invades the duodenal wall and vascular groove, resection margins negative, 4/34 lymph nodes positive MSI-stable, tumor showed instability in 2 loci  as did adjacent normal tissue EUS FNA biopsy of pancreas mass on 07/03/2018-well-differentiated neuroendocrine tumor CTs 01/29/2019-ill-defined pancreas head mass, 5 pulmonary nodules-1 with a small amount of central cavitation, tumor abuts the left margin of the portal vein indistinct density surrounding, hepatic artery, complex cystic lesion of the right kidney, right adrenal mass-characterized as an adenoma on a [facility] MRI 12/21/2018 Netspot 03/03/2019-no focal pancreas activity, no tracer accumulation within the suspicious pulmonary nodules, left uterine mass with tracer accumulation felt to represent a leiomyoma Elevated preoperative CA 19-9--CA 19-9 186 on 01/18/2019 CT chest 04/16/2019-multiple bilateral pulmonary nodules, some with increased cavitation, stable in size Cycle 1 FOLFIRINOX 04/27/2019 Cycle 2 FOLFIRINOX 05/11/2019 Cycle 3 FOLFIRINOX 05/23/2019 Cycle 4 FOLFIRINOX 06/08/2019 Cycle 5 FOLFIRINOX 06/22/2019 CT chest 07/02/2019-stable size of bilateral pulmonary nodules.  Dominant cavitary lesions in both lungs show increased cavitation with thinner walls.  Stable 2.1 cm right adrenal nodule. 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 No definite evidence of metastatic disease in the abdomen or pelvis. Markedly enlarged and heterogeneous appearing uterus, likely to represent multifocal fibroids. 1 of these lesions appears to be an exophytic subserosal fibroid in the posterior lateral aspect  of the uterine body on the left side although this comes in close proximity to the left adnexa  such that a primary ovarian lesion is difficult to completely exclude. CTs 02/07/2020-slight enlargement of bilateral lung nodules, some are cavitary, no evidence of metastatic disease in the abdomen or pelvis, stable right adrenal nodule, uterine fibroids CTs 04/26/2020-mild enlargement of pulmonary nodules, slight increase in trace pelvic fluid, new soft tissue thickening inferior to the cecal tip suspicious for peritoneal metastasis CT 05/26/2020-improved appearance of soft tissue at the inferior tip of the cecum, mildly thickened short appendix-findings suggestive of resolving appendicitis, stable small bibasilar pulmonary nodules, fibroids Plan biopsy of right cecal tip soft tissue canceled secondary to radiologic improvement CT chest 08/02/2020-enlargement and progressive cavitation of multiple bilateral lung nodules.  Some new nodules are present. CTs 10/24/2020- increase in size of pulmonary nodules, no new nodules, no evidence of metastatic disease in the abdomen, stable right adrenal nodule CT 01/09/2021-slight interval enlargement of pulmonary nodules, stable right adrenal nodule Navigation bronchoscopy 01/30/2021-left lower lobe cavitary nodule FNA-adenocarcinoma, brushing-adenocarcinoma.  Left lower lobe lavage-adenocarcinoma.  Right upper lobe brushing and FNA biopsy of cavitary nodule-adenocarcinoma-immunohistochemical profile consistent with pancreas adenocarcinoma Cycle 1 gemcitabine/Abraxane 03/28/2021 Cycle 2 gemcitabine/Abraxane 04/11/2021 Cycle 3 gemcitabine/Abraxane 04/25/2021 Cycle 4 gemcitabine/Abraxane 05/09/2021 Cycle 5 gemcitabine/Abraxane 05/23/2021 CT chest 06/05/2021-interval cavitation of multiple pulmonary nodules, some nodules have decreased in size, no new or enlarging nodules Cycle 6 gemcitabine/Abraxane 06/06/2021 Cycle 7 gemcitabine/Abraxane 06/21/2021 Cycle 8 gemcitabine/Abraxane 07/05/2021 Cycle 9 Gemcitabine/Abraxane 07/19/2021 Cycle 10 gemcitabine 08/01/2021-Abraxane  held secondary to neuropathy CT chest 08/13/2021-mild decrease in size and wall thickness of multiple cavitary nodules, no new or progressive disease in the chest, indeterminate low-attenuation right liver lesions Cycle 11 gemcitabine 08/16/2021-Abraxane held secondary to neuropathy Cycle 12 gemcitabine 08/30/2021-Abraxane held secondary to neuropathy Cycle 13 gemcitabine 09/13/2021-Abraxane held secondary to neuropathy Cycle 14 gemcitabine 09/27/2021-Abraxane held secondary to neuropathy Cycle 15 gemcitabine 10/11/2021-Abraxane held secondary to neuropathy CTs 10/22/2021-no change in multiple cavitary lung nodules, no evidence of disease progression, ill-defined hypodense lesion in the posterior right liver suspicious for metastatic disease Cycle 16 gemcitabine 10/25/2021 Cycle 17 gemcitabine 11/08/2021   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  She will continue treatment with gemcitabine.  Abraxane remains on hold secondary to neuropathy.  The potassium level is improved today.  She will continue potassium and magnesium supplements once daily.  She will follow-up with Dr. Baird Cancer for management of hypertension.  Alyssa Ross will return for an office visit and chemotherapy in 2 weeks.  [name], MD  11/07/2021  8:56 AM

## 2021-11-08 ENCOUNTER — Inpatient Hospital Stay: Payer: BC Managed Care – PPO | Attending: Genetic Counselor

## 2021-11-08 ENCOUNTER — Other Ambulatory Visit: Payer: Self-pay

## 2021-11-08 VITALS — BP 148/80 | HR 64 | Temp 97.6°F | Resp 18

## 2021-11-08 DIAGNOSIS — Z85528 Personal history of other malignant neoplasm of kidney: Secondary | ICD-10-CM | POA: Insufficient documentation

## 2021-11-08 DIAGNOSIS — Z79899 Other long term (current) drug therapy: Secondary | ICD-10-CM | POA: Diagnosis not present

## 2021-11-08 DIAGNOSIS — C7802 Secondary malignant neoplasm of left lung: Secondary | ICD-10-CM | POA: Diagnosis not present

## 2021-11-08 DIAGNOSIS — E119 Type 2 diabetes mellitus without complications: Secondary | ICD-10-CM | POA: Insufficient documentation

## 2021-11-08 DIAGNOSIS — Z5111 Encounter for antineoplastic chemotherapy: Secondary | ICD-10-CM | POA: Insufficient documentation

## 2021-11-08 DIAGNOSIS — C250 Malignant neoplasm of head of pancreas: Secondary | ICD-10-CM | POA: Diagnosis not present

## 2021-11-08 DIAGNOSIS — R1030 Lower abdominal pain, unspecified: Secondary | ICD-10-CM | POA: Insufficient documentation

## 2021-11-08 DIAGNOSIS — Z905 Acquired absence of kidney: Secondary | ICD-10-CM | POA: Insufficient documentation

## 2021-11-08 DIAGNOSIS — C7801 Secondary malignant neoplasm of right lung: Secondary | ICD-10-CM | POA: Diagnosis not present

## 2021-11-08 DIAGNOSIS — Z9049 Acquired absence of other specified parts of digestive tract: Secondary | ICD-10-CM | POA: Diagnosis not present

## 2021-11-08 DIAGNOSIS — I10 Essential (primary) hypertension: Secondary | ICD-10-CM | POA: Diagnosis not present

## 2021-11-08 DIAGNOSIS — R21 Rash and other nonspecific skin eruption: Secondary | ICD-10-CM | POA: Diagnosis not present

## 2021-11-08 LAB — CANCER ANTIGEN 19-9: CA 19-9: 25 U/mL (ref 0–35)

## 2021-11-08 MED ORDER — SODIUM CHLORIDE 0.9% FLUSH
10.0000 mL | INTRAVENOUS | Status: DC | PRN
  Administered 2021-11-08: 10 mL

## 2021-11-08 MED ORDER — SODIUM CHLORIDE 0.9 % IV SOLN: Freq: Once | INTRAVENOUS | Status: AC

## 2021-11-08 MED ORDER — SODIUM CHLORIDE 0.9 % IV SOLN
1000.0000 mg/m2 | Freq: Once | INTRAVENOUS | Status: AC
  Administered 2021-11-08: 1976 mg via INTRAVENOUS
  Filled 2021-11-08: qty 51.97

## 2021-11-08 MED ORDER — PROCHLORPERAZINE MALEATE 10 MG PO TABS
10.0000 mg | ORAL_TABLET | Freq: Once | ORAL | Status: AC
  Administered 2021-11-08: 10 mg via ORAL
  Filled 2021-11-08: qty 1

## 2021-11-08 MED ORDER — HEPARIN SOD (PORK) LOCK FLUSH 100 UNIT/ML IV SOLN
500.0000 [IU] | Freq: Once | INTRAVENOUS | Status: AC | PRN
  Administered 2021-11-08: 500 [IU]

## 2021-11-08 NOTE — Patient Instructions (Signed)
[facility]  Discharge Instructions: Thank you for choosing [facility] to provide your oncology and hematology care.   If you have a lab appointment with the [facility], please go directly to the [facility] and check in at the registration area.   Wear comfortable clothing and clothing appropriate for easy access to any Portacath or PICC line.   We strive to give you quality time with your provider. You may need to reschedule your appointment if you arrive late (15 or more minutes).  Arriving late affects you and other patients whose appointments are after yours.  Also, if you miss three or more appointments without notifying the office, you may be dismissed from the clinic at the provider's discretion.      For prescription refill requests, have your pharmacy contact our office and allow 72 hours for refills to be completed.    Today you received the following chemotherapy and/or immunotherapy agents: Gemcitabine.       To help prevent nausea and vomiting after your treatment, we encourage you to take your nausea medication as directed.  BELOW ARE SYMPTOMS THAT SHOULD BE REPORTED IMMEDIATELY: *FEVER GREATER THAN 100.4 F (38 C) OR HIGHER *CHILLS OR SWEATING *NAUSEA AND VOMITING THAT IS NOT CONTROLLED WITH YOUR NAUSEA MEDICATION *UNUSUAL SHORTNESS OF BREATH *UNUSUAL BRUISING OR BLEEDING *URINARY PROBLEMS (pain or burning when urinating, or frequent urination) *BOWEL PROBLEMS (unusual diarrhea, constipation, pain near the anus) TENDERNESS IN MOUTH AND THROAT WITH OR WITHOUT PRESENCE OF ULCERS (sore throat, sores in mouth, or a toothache) UNUSUAL RASH, SWELLING OR PAIN  UNUSUAL VAGINAL DISCHARGE OR ITCHING   Items with * indicate a potential emergency and should be followed up as soon as possible or go to the Emergency Department if any problems should occur.  Please show the CHEMOTHERAPY ALERT CARD or IMMUNOTHERAPY ALERT CARD at check-in  to the Emergency Department and triage nurse.  Should you have questions after your visit or need to cancel or reschedule your appointment, please contact [facility]  Dept: [phone]  and follow the prompts.  Office hours are 8:00 a.m. to 4:30 p.m. Monday - Friday. Please note that voicemails left after 4:00 p.m. may not be returned until the following business day.  We are closed weekends and major holidays. You have access to a nurse at all times for urgent questions. Please call the main number to the clinic Dept: [phone] and follow the prompts.   For any non-urgent questions, you may also contact your provider using MyChart. We now offer e-Visits for anyone 68 and older to request care online for non-urgent symptoms. For details visit [email or web address].   Also download the MyChart app! Go to the app store, search "MyChart", open the app, select [facility], and log in with your MyChart username and password.  Due to Covid, a mask is required upon entering the hospital/clinic. If you do not have a mask, one will be given to you upon arrival. For doctor visits, patients may have 1 support person aged 106 or older with them. For treatment visits, patients cannot have anyone with them due to current Covid guidelines and our immunocompromised population.

## 2021-11-11 ENCOUNTER — Other Ambulatory Visit: Payer: Self-pay | Admitting: Internal Medicine

## 2021-11-11 MED ORDER — VALSARTAN 160 MG PO TABS: ORAL_TABLET | ORAL | 1 refills | Status: DC

## 2021-11-17 ENCOUNTER — Other Ambulatory Visit: Payer: Self-pay | Admitting: Oncology

## 2021-11-20 ENCOUNTER — Other Ambulatory Visit: Payer: BC Managed Care – PPO

## 2021-11-20 ENCOUNTER — Ambulatory Visit: Payer: BC Managed Care – PPO | Admitting: Oncology

## 2021-11-22 ENCOUNTER — Inpatient Hospital Stay: Payer: BC Managed Care – PPO

## 2021-11-22 ENCOUNTER — Encounter: Payer: Self-pay | Admitting: Nurse Practitioner

## 2021-11-22 ENCOUNTER — Other Ambulatory Visit: Payer: BC Managed Care – PPO

## 2021-11-22 ENCOUNTER — Inpatient Hospital Stay (HOSPITAL_BASED_OUTPATIENT_CLINIC_OR_DEPARTMENT_OTHER): Payer: BC Managed Care – PPO | Admitting: Nurse Practitioner

## 2021-11-22 ENCOUNTER — Other Ambulatory Visit: Payer: Self-pay

## 2021-11-22 VITALS — BP 149/93 | HR 63 | Temp 98.2°F | Resp 18 | Ht 67.0 in | Wt 195.4 lb

## 2021-11-22 VITALS — BP 150/83

## 2021-11-22 DIAGNOSIS — C250 Malignant neoplasm of head of pancreas: Secondary | ICD-10-CM

## 2021-11-22 DIAGNOSIS — R21 Rash and other nonspecific skin eruption: Secondary | ICD-10-CM | POA: Diagnosis not present

## 2021-11-22 DIAGNOSIS — Z5111 Encounter for antineoplastic chemotherapy: Secondary | ICD-10-CM | POA: Diagnosis not present

## 2021-11-22 DIAGNOSIS — Z79899 Other long term (current) drug therapy: Secondary | ICD-10-CM | POA: Diagnosis not present

## 2021-11-22 DIAGNOSIS — C7802 Secondary malignant neoplasm of left lung: Secondary | ICD-10-CM | POA: Diagnosis not present

## 2021-11-22 DIAGNOSIS — E119 Type 2 diabetes mellitus without complications: Secondary | ICD-10-CM | POA: Diagnosis not present

## 2021-11-22 DIAGNOSIS — Z85528 Personal history of other malignant neoplasm of kidney: Secondary | ICD-10-CM | POA: Diagnosis not present

## 2021-11-22 DIAGNOSIS — R1030 Lower abdominal pain, unspecified: Secondary | ICD-10-CM | POA: Diagnosis not present

## 2021-11-22 DIAGNOSIS — Z9049 Acquired absence of other specified parts of digestive tract: Secondary | ICD-10-CM | POA: Diagnosis not present

## 2021-11-22 DIAGNOSIS — Z905 Acquired absence of kidney: Secondary | ICD-10-CM | POA: Diagnosis not present

## 2021-11-22 DIAGNOSIS — I10 Essential (primary) hypertension: Secondary | ICD-10-CM | POA: Diagnosis not present

## 2021-11-22 DIAGNOSIS — C7801 Secondary malignant neoplasm of right lung: Secondary | ICD-10-CM | POA: Diagnosis not present

## 2021-11-22 LAB — CBC WITH DIFFERENTIAL (CANCER CENTER ONLY)
Abs Immature Granulocytes: 0.01 10*3/uL (ref 0.00–0.07)
Basophils Absolute: 0 10*3/uL (ref 0.0–0.1)
Basophils Relative: 1 %
Eosinophils Absolute: 0.1 10*3/uL (ref 0.0–0.5)
Eosinophils Relative: 3 %
HCT: 29.8 % — ABNORMAL LOW (ref 36.0–46.0)
Hemoglobin: 9.1 g/dL — ABNORMAL LOW (ref 12.0–15.0)
Immature Granulocytes: 0 %
Lymphocytes Relative: 26 %
Lymphs Abs: 0.9 10*3/uL (ref 0.7–4.0)
MCH: 28.3 pg (ref 26.0–34.0)
MCHC: 30.5 g/dL (ref 30.0–36.0)
MCV: 92.5 fL (ref 80.0–100.0)
Monocytes Absolute: 0.4 10*3/uL (ref 0.1–1.0)
Monocytes Relative: 11 %
Neutro Abs: 2.1 10*3/uL (ref 1.7–7.7)
Neutrophils Relative %: 59 %
Platelet Count: 311 10*3/uL (ref 150–400)
RBC: 3.22 MIL/uL — ABNORMAL LOW (ref 3.87–5.11)
RDW: 15.3 % (ref 11.5–15.5)
WBC Count: 3.5 10*3/uL — ABNORMAL LOW (ref 4.0–10.5)
nRBC: 0 % (ref 0.0–0.2)

## 2021-11-22 LAB — CMP (CANCER CENTER ONLY)
ALT: 27 U/L (ref 0–44)
AST: 30 U/L (ref 15–41)
Albumin: 3.4 g/dL — ABNORMAL LOW (ref 3.5–5.0)
Alkaline Phosphatase: 281 U/L — ABNORMAL HIGH (ref 38–126)
Anion gap: 7 (ref 5–15)
BUN: 9 mg/dL (ref 6–20)
CO2: 25 mmol/L (ref 22–32)
Calcium: 8.8 mg/dL — ABNORMAL LOW (ref 8.9–10.3)
Chloride: 108 mmol/L (ref 98–111)
Creatinine: 1.14 mg/dL — ABNORMAL HIGH (ref 0.44–1.00)
GFR, Estimated: 57 mL/min — ABNORMAL LOW (ref >60)
Glucose, Bld: 129 mg/dL — ABNORMAL HIGH (ref 70–99)
Potassium: 3.3 mmol/L — ABNORMAL LOW (ref 3.5–5.1)
Sodium: 140 mmol/L (ref 135–145)
Total Bilirubin: 0.5 mg/dL (ref 0.3–1.2)
Total Protein: 6.6 g/dL (ref 6.5–8.1)

## 2021-11-22 LAB — MAGNESIUM: Magnesium: 1.6 mg/dL — ABNORMAL LOW (ref 1.7–2.4)

## 2021-11-22 MED ORDER — SODIUM CHLORIDE 0.9 % IV SOLN
1000.0000 mg/m2 | Freq: Once | INTRAVENOUS | Status: AC
  Administered 2021-11-22: 1976 mg via INTRAVENOUS
  Filled 2021-11-22: qty 51.97

## 2021-11-22 MED ORDER — SODIUM CHLORIDE 0.9% FLUSH
10.0000 mL | INTRAVENOUS | Status: DC | PRN
  Administered 2021-11-22: 10 mL

## 2021-11-22 MED ORDER — SODIUM CHLORIDE 0.9 % IV SOLN: Freq: Once | INTRAVENOUS | Status: AC

## 2021-11-22 MED ORDER — HEPARIN SOD (PORK) LOCK FLUSH 100 UNIT/ML IV SOLN
500.0000 [IU] | Freq: Once | INTRAVENOUS | Status: AC | PRN
  Administered 2021-11-22: 500 [IU]

## 2021-11-22 MED ORDER — PROCHLORPERAZINE MALEATE 10 MG PO TABS
10.0000 mg | ORAL_TABLET | Freq: Once | ORAL | Status: AC
  Administered 2021-11-22: 10 mg via ORAL
  Filled 2021-11-22: qty 1

## 2021-11-22 MED ORDER — DOXYCYCLINE HYCLATE 100 MG PO TABS: 100.0000 mg | ORAL_TABLET | Freq: Two times a day (BID) | ORAL | 0 refills | Status: AC

## 2021-11-22 NOTE — Patient Instructions (Signed)
[facility]  Discharge Instructions: Thank you for choosing [facility] to provide your oncology and hematology care.   If you have a lab appointment with the [facility], please go directly to the [facility] and check in at the registration area.   Wear comfortable clothing and clothing appropriate for easy access to any Portacath or PICC line.   We strive to give you quality time with your provider. You may need to reschedule your appointment if you arrive late (15 or more minutes).  Arriving late affects you and other patients whose appointments are after yours.  Also, if you miss three or more appointments without notifying the office, you may be dismissed from the clinic at the provider's discretion.      For prescription refill requests, have your pharmacy contact our office and allow 72 hours for refills to be completed.    Today you received the following chemotherapy and/or immunotherapy agents Gemzar      To help prevent nausea and vomiting after your treatment, we encourage you to take your nausea medication as directed.  BELOW ARE SYMPTOMS THAT SHOULD BE REPORTED IMMEDIATELY: *FEVER GREATER THAN 100.4 F (38 C) OR HIGHER *CHILLS OR SWEATING *NAUSEA AND VOMITING THAT IS NOT CONTROLLED WITH YOUR NAUSEA MEDICATION *UNUSUAL SHORTNESS OF BREATH *UNUSUAL BRUISING OR BLEEDING *URINARY PROBLEMS (pain or burning when urinating, or frequent urination) *BOWEL PROBLEMS (unusual diarrhea, constipation, pain near the anus) TENDERNESS IN MOUTH AND THROAT WITH OR WITHOUT PRESENCE OF ULCERS (sore throat, sores in mouth, or a toothache) UNUSUAL RASH, SWELLING OR PAIN  UNUSUAL VAGINAL DISCHARGE OR ITCHING   Items with * indicate a potential emergency and should be followed up as soon as possible or go to the Emergency Department if any problems should occur.  Please show the CHEMOTHERAPY ALERT CARD or IMMUNOTHERAPY ALERT CARD at check-in to the  Emergency Department and triage nurse.  Should you have questions after your visit or need to cancel or reschedule your appointment, please contact [facility]  Dept: [phone]  and follow the prompts.  Office hours are 8:00 a.m. to 4:30 p.m. Monday - Friday. Please note that voicemails left after 4:00 p.m. may not be returned until the following business day.  We are closed weekends and major holidays. You have access to a nurse at all times for urgent questions. Please call the main number to the clinic Dept: [phone] and follow the prompts.   For any non-urgent questions, you may also contact your provider using MyChart. We now offer e-Visits for anyone 11 and older to request care online for non-urgent symptoms. For details visit [email or web address].   Also download the MyChart app! Go to the app store, search "MyChart", open the app, select [facility], and log in with your MyChart username and password.  Masks are optional in the cancer centers. If you would like for your care team to wear a mask while they are taking care of you, please let them know. For doctor visits, patients may have with them one support person who is at least 56 years old. At this time, visitors are not allowed in the infusion area.

## 2021-11-22 NOTE — Progress Notes (Signed)
[facility] OFFICE PROGRESS NOTE   Diagnosis: Pancreas cancer  INTERVAL HISTORY:   Alyssa Ross returns as scheduled.  She completed another cycle of gemcitabine 11/08/2021.  She denies nausea/vomiting.  No mouth sores.  No diarrhea.  No fever or rash.  No shortness of breath or cough.  Stable neuropathy symptoms.  She notes a persistent rash on her face and neck.  Objective:  Vital signs in last 24 hours:  Blood pressure (!) 149/93, pulse 63, temperature 98.2 F (36.8 C), temperature source Oral, resp. rate 18, height 5' 7"  (1.702 m), weight 195 lb 6.4 oz (88.6 kg), last menstrual period 09/17/2012, SpO2 100 %.    HEENT: No thrush or ulcers. Resp: Lungs clear bilaterally. Cardio: Regular rate and rhythm. GI: No hepatosplenomegaly. Vascular: Trace edema lower leg bilaterally. Skin: Hyperpigmented raised lesions on the face and neck, some pustular. Port-A-Cath without erythema.  Lab Results:  Lab Results  Component Value Date   WBC 3.5 (L) 11/22/2021   HGB 9.1 (L) 11/22/2021   HCT 29.8 (L) 11/22/2021   MCV 92.5 11/22/2021   PLT 311 11/22/2021   NEUTROABS 2.1 11/22/2021    Imaging:  No results found.  Medications: I have reviewed the patient's current medications.  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 Tumor invades the duodenal wall and vascular groove, resection margins negative, 4/34 lymph nodes positive MSI-stable, tumor showed instability in 2 loci as did adjacent normal tissue EUS FNA biopsy of pancreas mass on 07/03/2018-well-differentiated neuroendocrine tumor CTs 01/29/2019-ill-defined pancreas head mass, 5 pulmonary nodules-1 with a small amount of central cavitation, tumor abuts the left margin of the portal vein indistinct density surrounding, hepatic artery, complex cystic lesion of the right kidney, right adrenal mass-characterized as an adenoma on a [facility] MRI 12/21/2018 Netspot  03/03/2019-no focal pancreas activity, no tracer accumulation within the suspicious pulmonary nodules, left uterine mass with tracer accumulation felt to represent a leiomyoma Elevated preoperative CA 19-9--CA 19-9 186 on 01/18/2019 CT chest 04/16/2019-multiple bilateral pulmonary nodules, some with increased cavitation, stable in size Cycle 1 FOLFIRINOX 04/27/2019 Cycle 2 FOLFIRINOX 05/11/2019 Cycle 3 FOLFIRINOX 05/23/2019 Cycle 4 FOLFIRINOX 06/08/2019 Cycle 5 FOLFIRINOX 06/22/2019 CT chest 07/02/2019-stable size of bilateral pulmonary nodules.  Dominant cavitary lesions in both lungs show increased cavitation with thinner walls.  Stable 2.1 cm right adrenal nodule. 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 No definite evidence of metastatic disease in the abdomen or pelvis. Markedly enlarged and heterogeneous appearing uterus, likely to represent multifocal fibroids. 1 of these lesions appears to be an exophytic subserosal fibroid in the posterior lateral aspect of the uterine body on the left side although this comes in close proximity to the left adnexa such that a primary ovarian lesion is difficult to completely exclude. CTs 02/07/2020-slight enlargement of bilateral lung nodules, some are cavitary, no evidence of metastatic disease in the abdomen or pelvis, stable right adrenal nodule, uterine fibroids CTs 04/26/2020-mild enlargement of pulmonary nodules, slight increase in trace pelvic fluid, new soft tissue thickening inferior to the cecal tip suspicious for peritoneal metastasis CT  05/26/2020-improved appearance of soft tissue at the inferior tip of the cecum, mildly thickened  short appendix-findings suggestive of resolving appendicitis, stable small bibasilar pulmonary nodules, fibroids Plan biopsy of right cecal tip soft tissue canceled secondary to radiologic improvement CT chest 08/02/2020-enlargement and progressive cavitation of multiple bilateral lung nodules.  Some new nodules are present. CTs 10/24/2020- increase in size of pulmonary nodules, no new nodules, no evidence of metastatic disease in the abdomen, stable right adrenal nodule CT 01/09/2021-slight interval enlargement of pulmonary nodules, stable right adrenal nodule Navigation bronchoscopy 01/30/2021-left lower lobe cavitary nodule FNA-adenocarcinoma, brushing-adenocarcinoma.  Left lower lobe lavage-adenocarcinoma.  Right upper lobe brushing and FNA biopsy of cavitary nodule-adenocarcinoma-immunohistochemical profile consistent with pancreas adenocarcinoma Cycle 1 gemcitabine/Abraxane 03/28/2021 Cycle 2 gemcitabine/Abraxane 04/11/2021 Cycle 3 gemcitabine/Abraxane 04/25/2021 Cycle 4 gemcitabine/Abraxane 05/09/2021 Cycle 5 gemcitabine/Abraxane 05/23/2021 CT chest 06/05/2021-interval cavitation of multiple pulmonary nodules, some nodules have decreased in size, no new or enlarging nodules Cycle 6 gemcitabine/Abraxane 06/06/2021 Cycle 7 gemcitabine/Abraxane 06/21/2021 Cycle 8 gemcitabine/Abraxane 07/05/2021 Cycle 9 Gemcitabine/Abraxane 07/19/2021 Cycle 10 gemcitabine 08/01/2021-Abraxane held secondary to neuropathy CT chest 08/13/2021-mild decrease in size and wall thickness of multiple cavitary nodules, no new or progressive disease in the chest, indeterminate low-attenuation right liver lesions Cycle 11 gemcitabine 08/16/2021-Abraxane held secondary to neuropathy Cycle 12 gemcitabine 08/30/2021-Abraxane held secondary to neuropathy Cycle 13 gemcitabine 09/13/2021-Abraxane held secondary to neuropathy Cycle 14 gemcitabine  09/27/2021-Abraxane held secondary to neuropathy Cycle 15 gemcitabine 10/11/2021-Abraxane held secondary to neuropathy CTs 10/22/2021-no change in multiple cavitary lung nodules, no evidence of disease progression, ill-defined hypodense lesion in the posterior right liver suspicious for metastatic disease Cycle 16 gemcitabine 10/25/2021 Cycle 17 gemcitabine 11/08/2021 Cycle 18 Gemcitabine 11/22/2021   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  She continues Gemcitabine on a 2-week schedule.  She is tolerating well.  Plan to continue the same.  CBC and chemistry panel reviewed.  Labs adequate to proceed as above.  She has mild hypokalemia and mild hypomagnesemia.  She will increase from once daily to twice daily for both supplements.  The rash on her face and neck is pustular in some areas.  She will complete a course of doxycycline.  She will return for lab, follow-up, Gemcitabine in 2 weeks.  She will contact the office in the interim with any problems.    Ned Card ANP/GNP-BC   11/22/2021  11:50 AM

## 2021-11-23 LAB — CANCER ANTIGEN 19-9: CA 19-9: 24 U/mL (ref 0–35)

## 2021-12-02 ENCOUNTER — Other Ambulatory Visit: Payer: Self-pay | Admitting: Oncology

## 2021-12-05 ENCOUNTER — Other Ambulatory Visit: Payer: Self-pay | Admitting: Internal Medicine

## 2021-12-06 ENCOUNTER — Inpatient Hospital Stay: Payer: BC Managed Care – PPO

## 2021-12-06 ENCOUNTER — Inpatient Hospital Stay (HOSPITAL_BASED_OUTPATIENT_CLINIC_OR_DEPARTMENT_OTHER): Payer: BC Managed Care – PPO | Admitting: Oncology

## 2021-12-06 ENCOUNTER — Encounter: Payer: Self-pay | Admitting: *Deleted

## 2021-12-06 VITALS — BP 120/77 | HR 58 | Temp 97.6°F | Resp 16

## 2021-12-06 VITALS — BP 131/81 | HR 73 | Temp 97.9°F | Resp 18 | Ht 67.0 in | Wt 190.2 lb

## 2021-12-06 DIAGNOSIS — C7802 Secondary malignant neoplasm of left lung: Secondary | ICD-10-CM | POA: Diagnosis not present

## 2021-12-06 DIAGNOSIS — Z9049 Acquired absence of other specified parts of digestive tract: Secondary | ICD-10-CM | POA: Diagnosis not present

## 2021-12-06 DIAGNOSIS — C250 Malignant neoplasm of head of pancreas: Secondary | ICD-10-CM | POA: Diagnosis not present

## 2021-12-06 DIAGNOSIS — R21 Rash and other nonspecific skin eruption: Secondary | ICD-10-CM | POA: Diagnosis not present

## 2021-12-06 DIAGNOSIS — E119 Type 2 diabetes mellitus without complications: Secondary | ICD-10-CM | POA: Diagnosis not present

## 2021-12-06 DIAGNOSIS — I10 Essential (primary) hypertension: Secondary | ICD-10-CM | POA: Diagnosis not present

## 2021-12-06 DIAGNOSIS — Z79899 Other long term (current) drug therapy: Secondary | ICD-10-CM | POA: Diagnosis not present

## 2021-12-06 DIAGNOSIS — Z5111 Encounter for antineoplastic chemotherapy: Secondary | ICD-10-CM | POA: Diagnosis not present

## 2021-12-06 DIAGNOSIS — R1030 Lower abdominal pain, unspecified: Secondary | ICD-10-CM | POA: Diagnosis not present

## 2021-12-06 DIAGNOSIS — Z85528 Personal history of other malignant neoplasm of kidney: Secondary | ICD-10-CM | POA: Diagnosis not present

## 2021-12-06 DIAGNOSIS — C7801 Secondary malignant neoplasm of right lung: Secondary | ICD-10-CM | POA: Diagnosis not present

## 2021-12-06 DIAGNOSIS — Z905 Acquired absence of kidney: Secondary | ICD-10-CM | POA: Diagnosis not present

## 2021-12-06 LAB — CBC WITH DIFFERENTIAL (CANCER CENTER ONLY)
Abs Immature Granulocytes: 0.01 10*3/uL (ref 0.00–0.07)
Basophils Absolute: 0 10*3/uL (ref 0.0–0.1)
Basophils Relative: 1 %
Eosinophils Absolute: 0.1 10*3/uL (ref 0.0–0.5)
Eosinophils Relative: 4 %
HCT: 31.6 % — ABNORMAL LOW (ref 36.0–46.0)
Hemoglobin: 9.6 g/dL — ABNORMAL LOW (ref 12.0–15.0)
Immature Granulocytes: 0 %
Lymphocytes Relative: 29 %
Lymphs Abs: 1 10*3/uL (ref 0.7–4.0)
MCH: 27.9 pg (ref 26.0–34.0)
MCHC: 30.4 g/dL (ref 30.0–36.0)
MCV: 91.9 fL (ref 80.0–100.0)
Monocytes Absolute: 0.5 10*3/uL (ref 0.1–1.0)
Monocytes Relative: 13 %
Neutro Abs: 1.8 10*3/uL (ref 1.7–7.7)
Neutrophils Relative %: 53 %
Platelet Count: 237 10*3/uL (ref 150–400)
RBC: 3.44 MIL/uL — ABNORMAL LOW (ref 3.87–5.11)
RDW: 16.1 % — ABNORMAL HIGH (ref 11.5–15.5)
WBC Count: 3.4 10*3/uL — ABNORMAL LOW (ref 4.0–10.5)
nRBC: 0 % (ref 0.0–0.2)

## 2021-12-06 LAB — CMP (CANCER CENTER ONLY)
ALT: 33 U/L (ref 0–44)
AST: 40 U/L (ref 15–41)
Albumin: 3.6 g/dL (ref 3.5–5.0)
Alkaline Phosphatase: 245 U/L — ABNORMAL HIGH (ref 38–126)
Anion gap: 9 (ref 5–15)
BUN: 7 mg/dL (ref 6–20)
CO2: 24 mmol/L (ref 22–32)
Calcium: 9 mg/dL (ref 8.9–10.3)
Chloride: 109 mmol/L (ref 98–111)
Creatinine: 1.24 mg/dL — ABNORMAL HIGH (ref 0.44–1.00)
GFR, Estimated: 51 mL/min — ABNORMAL LOW (ref >60)
Glucose, Bld: 110 mg/dL — ABNORMAL HIGH (ref 70–99)
Potassium: 3.7 mmol/L (ref 3.5–5.1)
Sodium: 142 mmol/L (ref 135–145)
Total Bilirubin: 0.6 mg/dL (ref 0.3–1.2)
Total Protein: 7.2 g/dL (ref 6.5–8.1)

## 2021-12-06 LAB — MAGNESIUM: Magnesium: 1.6 mg/dL — ABNORMAL LOW (ref 1.7–2.4)

## 2021-12-06 MED ORDER — SODIUM CHLORIDE 0.9 % IV SOLN: Freq: Once | INTRAVENOUS | Status: AC

## 2021-12-06 MED ORDER — SODIUM CHLORIDE 0.9 % IV SOLN
1000.0000 mg/m2 | Freq: Once | INTRAVENOUS | Status: AC
  Administered 2021-12-06: 1976 mg via INTRAVENOUS
  Filled 2021-12-06: qty 51.97

## 2021-12-06 MED ORDER — HEPARIN SOD (PORK) LOCK FLUSH 100 UNIT/ML IV SOLN
500.0000 [IU] | Freq: Once | INTRAVENOUS | Status: AC | PRN
  Administered 2021-12-06: 500 [IU]

## 2021-12-06 MED ORDER — PROCHLORPERAZINE MALEATE 10 MG PO TABS
10.0000 mg | ORAL_TABLET | Freq: Once | ORAL | Status: AC
  Administered 2021-12-06: 10 mg via ORAL
  Filled 2021-12-06: qty 1

## 2021-12-06 MED ORDER — SODIUM CHLORIDE 0.9% FLUSH
10.0000 mL | INTRAVENOUS | Status: DC | PRN
  Administered 2021-12-06: 10 mL

## 2021-12-06 NOTE — Progress Notes (Signed)
Patient seen by Dr. Sherrill today ? ?Vitals are within treatment parameters. ? ?Labs reviewed by Dr. Sherrill and are within treatment parameters. ? ?Per physician team, patient is ready for treatment and there are NO modifications to the treatment plan.  ?

## 2021-12-06 NOTE — Progress Notes (Signed)
[place] OFFICE PROGRESS NOTE   Diagnosis: Pancreas cancer  INTERVAL HISTORY:   Alyssa Ross returns as scheduled.  She completed another treatment with gemcitabine on 11/22/2021.  She continues to have peripheral numbness.  This has not changed.  No new complaint.  She has noted mild improvement in the skin lesions with doxycycline.  Objective:  Vital signs in last 24 hours:  Blood pressure 131/81, pulse 73, temperature 97.9 F (36.6 C), resp. rate 18, height 5' 7"  (1.702 m), weight 190 lb 3.2 oz (86.3 kg), last menstrual period 09/17/2012, SpO2 100 %.    HEENT: No thrush or ulcers Resp: Lungs clear bilaterally Cardio: Regular rate and rhythm GI: No hepatosplenomegaly, nontender Vascular: No leg edema  Skin: Pustular rash over the face and neck  Portacath/PICC-without erythema  Lab Results:  Lab Results  Component Value Date   WBC 3.4 (L) 12/06/2021   HGB 9.6 (L) 12/06/2021   HCT 31.6 (L) 12/06/2021   MCV 91.9 12/06/2021   PLT 237 12/06/2021   NEUTROABS 1.8 12/06/2021    CMP  Lab Results  Component Value Date   NA 142 12/06/2021   K 3.7 12/06/2021   CL 109 12/06/2021   CO2 24 12/06/2021   GLUCOSE 110 (H) 12/06/2021   BUN 7 12/06/2021   CREATININE 1.24 (H) 12/06/2021   CALCIUM 9.0 12/06/2021   PROT 7.2 12/06/2021   ALBUMIN 3.6 12/06/2021   AST 40 12/06/2021   ALT 33 12/06/2021   ALKPHOS 245 (H) 12/06/2021   BILITOT 0.6 12/06/2021   GFRNONAA 51 (L) 12/06/2021   GFRAA >60 02/07/2020    Lab Results  Component Value Date   FHL456 24 11/22/2021      Medications: I have reviewed the patient's current medications.   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 Tumor invades the duodenal wall and vascular groove, resection margins negative, 4/34 lymph nodes positive MSI-stable, tumor showed instability in 2 loci as did adjacent normal tissue EUS FNA biopsy of pancreas mass on  07/03/2018-well-differentiated neuroendocrine tumor CTs 01/29/2019-ill-defined pancreas head mass, 5 pulmonary nodules-1 with a small amount of central cavitation, tumor abuts the left margin of the portal vein indistinct density surrounding, hepatic artery, complex cystic lesion of the right kidney, right adrenal mass-characterized as an adenoma on a [facility] MRI 12/21/2018 Netspot 03/03/2019-no focal pancreas activity, no tracer accumulation within the suspicious pulmonary nodules, left uterine mass with tracer accumulation felt to represent a leiomyoma Elevated preoperative CA 19-9--CA 19-9 186 on 01/18/2019 CT chest 04/16/2019-multiple bilateral pulmonary nodules, some with increased cavitation, stable in size Cycle 1 FOLFIRINOX 04/27/2019 Cycle 2 FOLFIRINOX 05/11/2019 Cycle 3 FOLFIRINOX 05/23/2019 Cycle 4 FOLFIRINOX 06/08/2019 Cycle 5 FOLFIRINOX 06/22/2019 CT chest 07/02/2019-stable size of bilateral pulmonary nodules.  Dominant cavitary lesions in both lungs show increased cavitation with thinner walls.  Stable 2.1 cm right adrenal nodule. 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 No definite evidence of metastatic disease in the abdomen or pelvis. Markedly enlarged and heterogeneous appearing uterus, likely to represent multifocal fibroids. 1 of these lesions appears to be an exophytic subserosal fibroid in the posterior lateral aspect of the uterine body on  the left side although this comes in close proximity to the left adnexa such that a primary ovarian lesion is difficult to completely  exclude. CTs 02/07/2020-slight enlargement of bilateral lung nodules, some are cavitary, no evidence of metastatic disease in the abdomen or pelvis, stable right adrenal nodule, uterine fibroids CTs 04/26/2020-mild enlargement of pulmonary nodules, slight increase in trace pelvic fluid, new soft tissue thickening inferior to the cecal tip suspicious for peritoneal metastasis CT 05/26/2020-improved appearance of soft tissue at the inferior tip of the cecum, mildly thickened short appendix-findings suggestive of resolving appendicitis, stable small bibasilar pulmonary nodules, fibroids Plan biopsy of right cecal tip soft tissue canceled secondary to radiologic improvement CT chest 08/02/2020-enlargement and progressive cavitation of multiple bilateral lung nodules.  Some new nodules are present. CTs 10/24/2020- increase in size of pulmonary nodules, no new nodules, no evidence of metastatic disease in the abdomen, stable right adrenal nodule CT 01/09/2021-slight interval enlargement of pulmonary nodules, stable right adrenal nodule Navigation bronchoscopy 01/30/2021-left lower lobe cavitary nodule FNA-adenocarcinoma, brushing-adenocarcinoma.  Left lower lobe lavage-adenocarcinoma.  Right upper lobe brushing and FNA biopsy of cavitary nodule-adenocarcinoma-immunohistochemical profile consistent with pancreas adenocarcinoma Cycle 1 gemcitabine/Abraxane 03/28/2021 Cycle 2 gemcitabine/Abraxane 04/11/2021 Cycle 3 gemcitabine/Abraxane 04/25/2021 Cycle 4 gemcitabine/Abraxane 05/09/2021 Cycle 5 gemcitabine/Abraxane 05/23/2021 CT chest 06/05/2021-interval cavitation of multiple pulmonary nodules, some nodules have decreased in size, no new or enlarging nodules Cycle 6 gemcitabine/Abraxane 06/06/2021 Cycle 7 gemcitabine/Abraxane 06/21/2021 Cycle 8 gemcitabine/Abraxane 07/05/2021 Cycle 9 Gemcitabine/Abraxane 07/19/2021 Cycle 10 gemcitabine 08/01/2021-Abraxane held secondary to neuropathy CT chest 08/13/2021-mild decrease  in size and wall thickness of multiple cavitary nodules, no new or progressive disease in the chest, indeterminate low-attenuation right liver lesions Cycle 11 gemcitabine 08/16/2021-Abraxane held secondary to neuropathy Cycle 12 gemcitabine 08/30/2021-Abraxane held secondary to neuropathy Cycle 13 gemcitabine 09/13/2021-Abraxane held secondary to neuropathy Cycle 14 gemcitabine 09/27/2021-Abraxane held secondary to neuropathy Cycle 15 gemcitabine 10/11/2021-Abraxane held secondary to neuropathy CTs 10/22/2021-no change in multiple cavitary lung nodules, no evidence of disease progression, ill-defined hypodense lesion in the posterior right liver suspicious for metastatic disease Cycle 16 gemcitabine 10/25/2021 Cycle 17 gemcitabine 11/08/2021 Cycle 18 Gemcitabine 11/22/2021 Cycle 19 gemcitabine 12/06/2021   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 Hedgepeth appears stable.  The CA 19-9 remains in the normal range.  She appears asymptomatic from pancreas cancer.  The plan is to continue every 2-week gemcitabine.  She will be referred for a restaging chest CT in August.  [name], MD  12/06/2021  11:52 AM

## 2021-12-06 NOTE — Patient Instructions (Signed)

## 2021-12-06 NOTE — Patient Instructions (Signed)
Gemcitabine injection What is this medication? GEMCITABINE (jem SYE ta been) is a chemotherapy drug. This medicine is used to treat many types of cancer like breast cancer, lung cancer, pancreatic cancer, and ovarian cancer. This medicine may be used for other purposes; ask your health care provider or pharmacist if you have questions. COMMON BRAND NAME(S): Gemzar, Infugem What should I tell my care team before I take this medication? They need to know if you have any of these conditions: blood disorders infection kidney disease liver disease lung or breathing disease, like asthma recent or ongoing radiation therapy an unusual or allergic reaction to gemcitabine, other chemotherapy, other medicines, foods, dyes, or preservatives pregnant or trying to get pregnant breast-feeding How should I use this medication? This drug is given as an infusion into a vein. It is administered in a hospital or clinic by a specially trained health care professional. Talk to your pediatrician regarding the use of this medicine in children. Special care may be needed. Overdosage: If you think you have taken too much of this medicine contact a poison control center or emergency room at once. NOTE: This medicine is only for you. Do not share this medicine with others. What if I miss a dose? It is important not to miss your dose. Call your doctor or health care professional if you are unable to keep an appointment. What may interact with this medication? medicines to increase blood counts like filgrastim, pegfilgrastim, sargramostim some other chemotherapy drugs like cisplatin vaccines Talk to your doctor or health care professional before taking any of these medicines: acetaminophen aspirin ibuprofen ketoprofen naproxen This list may not describe all possible interactions. Give your health care provider a list of all the medicines, herbs, non-prescription drugs, or dietary supplements you use. Also tell  them if you smoke, drink alcohol, or use illegal drugs. Some items may interact with your medicine. What should I watch for while using this medication? Visit your doctor for checks on your progress. This drug may make you feel generally unwell. This is not uncommon, as chemotherapy can affect healthy cells as well as cancer cells. Report any side effects. Continue your course of treatment even though you feel ill unless your doctor tells you to stop. In some cases, you may be given additional medicines to help with side effects. Follow all directions for their use. Call your doctor or health care professional for advice if you get a fever, chills or sore throat, or other symptoms of a cold or flu. Do not treat yourself. This drug decreases your body's ability to fight infections. Try to avoid being around people who are sick. This medicine may increase your risk to bruise or bleed. Call your doctor or health care professional if you notice any unusual bleeding. Be careful brushing and flossing your teeth or using a toothpick because you may get an infection or bleed more easily. If you have any dental work done, tell your dentist you are receiving this medicine. Avoid taking products that contain aspirin, acetaminophen, ibuprofen, naproxen, or ketoprofen unless instructed by your doctor. These medicines may hide a fever. Do not become pregnant while taking this medicine or for 6 months after stopping it. Women should inform their doctor if they wish to become pregnant or think they might be pregnant. Men should not father a child while taking this medicine and for 3 months after stopping it. There is a potential for serious side effects to an unborn child. Talk to your health care professional  or pharmacist for more information. Do not breast-feed an infant while taking this medicine or for at least 1 week after stopping it. Men should inform their doctors if they wish to father a child. This medicine may  lower sperm counts. Talk with your doctor or health care professional if you are concerned about your fertility. What side effects may I notice from receiving this medication? Side effects that you should report to your doctor or health care professional as soon as possible: allergic reactions like skin rash, itching or hives, swelling of the face, lips, or tongue breathing problems pain, redness, or irritation at site where injected signs and symptoms of a dangerous change in heartbeat or heart rhythm like chest pain; dizziness; fast or irregular heartbeat; palpitations; feeling faint or lightheaded, falls; breathing problems signs of decreased platelets or bleeding - bruising, pinpoint red spots on the skin, black, tarry stools, blood in the urine signs of decreased red blood cells - unusually weak or tired, feeling faint or lightheaded, falls signs of infection - fever or chills, cough, sore throat, pain or difficulty passing urine signs and symptoms of kidney injury like trouble passing urine or change in the amount of urine signs and symptoms of liver injury like dark yellow or brown urine; general ill feeling or flu-like symptoms; light-colored stools; loss of appetite; nausea; right upper belly pain; unusually weak or tired; yellowing of the eyes or skin swelling of ankles, feet, hands Side effects that usually do not require medical attention (report to your doctor or health care professional if they continue or are bothersome): constipation diarrhea hair loss loss of appetite nausea rash vomiting This list may not describe all possible side effects. Call your doctor for medical advice about side effects. You may report side effects to FDA at 1-800-FDA-1088. Where should I keep my medication? This drug is given in a hospital or clinic and will not be stored at home. NOTE: This sheet is a summary. It may not cover all possible information. If you have questions about this medicine,  talk to your doctor, pharmacist, or health care provider.  2023 Elsevier/Gold Standard (2017-08-20 00:00:00)

## 2021-12-07 LAB — CANCER ANTIGEN 19-9: CA 19-9: 31 U/mL (ref 0–35)

## 2021-12-15 ENCOUNTER — Other Ambulatory Visit: Payer: Self-pay | Admitting: Oncology

## 2021-12-20 ENCOUNTER — Encounter: Payer: Self-pay | Admitting: *Deleted

## 2021-12-20 ENCOUNTER — Inpatient Hospital Stay (HOSPITAL_BASED_OUTPATIENT_CLINIC_OR_DEPARTMENT_OTHER): Payer: BC Managed Care – PPO | Admitting: Nurse Practitioner

## 2021-12-20 ENCOUNTER — Inpatient Hospital Stay: Payer: BC Managed Care – PPO | Attending: Genetic Counselor

## 2021-12-20 ENCOUNTER — Other Ambulatory Visit (HOSPITAL_BASED_OUTPATIENT_CLINIC_OR_DEPARTMENT_OTHER): Payer: Self-pay

## 2021-12-20 ENCOUNTER — Encounter: Payer: Self-pay | Admitting: Nurse Practitioner

## 2021-12-20 ENCOUNTER — Inpatient Hospital Stay: Payer: BC Managed Care – PPO

## 2021-12-20 VITALS — BP 108/73 | HR 55 | Temp 97.7°F | Resp 16

## 2021-12-20 VITALS — BP 129/87 | HR 89 | Temp 98.1°F | Resp 20 | Ht 67.0 in | Wt 187.0 lb

## 2021-12-20 DIAGNOSIS — C7802 Secondary malignant neoplasm of left lung: Secondary | ICD-10-CM | POA: Insufficient documentation

## 2021-12-20 DIAGNOSIS — C250 Malignant neoplasm of head of pancreas: Secondary | ICD-10-CM

## 2021-12-20 DIAGNOSIS — E1140 Type 2 diabetes mellitus with diabetic neuropathy, unspecified: Secondary | ICD-10-CM | POA: Diagnosis not present

## 2021-12-20 DIAGNOSIS — Z5111 Encounter for antineoplastic chemotherapy: Secondary | ICD-10-CM | POA: Diagnosis not present

## 2021-12-20 DIAGNOSIS — I10 Essential (primary) hypertension: Secondary | ICD-10-CM | POA: Insufficient documentation

## 2021-12-20 DIAGNOSIS — C7801 Secondary malignant neoplasm of right lung: Secondary | ICD-10-CM | POA: Diagnosis not present

## 2021-12-20 DIAGNOSIS — Z79899 Other long term (current) drug therapy: Secondary | ICD-10-CM | POA: Insufficient documentation

## 2021-12-20 DIAGNOSIS — Z90411 Acquired partial absence of pancreas: Secondary | ICD-10-CM | POA: Insufficient documentation

## 2021-12-20 DIAGNOSIS — Z905 Acquired absence of kidney: Secondary | ICD-10-CM | POA: Insufficient documentation

## 2021-12-20 DIAGNOSIS — N6321 Unspecified lump in the left breast, upper outer quadrant: Secondary | ICD-10-CM | POA: Diagnosis not present

## 2021-12-20 LAB — CBC WITH DIFFERENTIAL (CANCER CENTER ONLY)
Abs Immature Granulocytes: 0 10*3/uL (ref 0.00–0.07)
Basophils Absolute: 0 10*3/uL (ref 0.0–0.1)
Basophils Relative: 1 %
Eosinophils Absolute: 0.1 10*3/uL (ref 0.0–0.5)
Eosinophils Relative: 2 %
HCT: 30.7 % — ABNORMAL LOW (ref 36.0–46.0)
Hemoglobin: 9.7 g/dL — ABNORMAL LOW (ref 12.0–15.0)
Immature Granulocytes: 0 %
Lymphocytes Relative: 25 %
Lymphs Abs: 1 10*3/uL (ref 0.7–4.0)
MCH: 28.8 pg (ref 26.0–34.0)
MCHC: 31.6 g/dL (ref 30.0–36.0)
MCV: 91.1 fL (ref 80.0–100.0)
Monocytes Absolute: 0.5 10*3/uL (ref 0.1–1.0)
Monocytes Relative: 13 %
Neutro Abs: 2.3 10*3/uL (ref 1.7–7.7)
Neutrophils Relative %: 59 %
Platelet Count: 251 10*3/uL (ref 150–400)
RBC: 3.37 MIL/uL — ABNORMAL LOW (ref 3.87–5.11)
RDW: 16.5 % — ABNORMAL HIGH (ref 11.5–15.5)
WBC Count: 4 10*3/uL (ref 4.0–10.5)
nRBC: 0 % (ref 0.0–0.2)

## 2021-12-20 LAB — CMP (CANCER CENTER ONLY)
ALT: 42 U/L (ref 0–44)
AST: 37 U/L (ref 15–41)
Albumin: 3.7 g/dL (ref 3.5–5.0)
Alkaline Phosphatase: 206 U/L — ABNORMAL HIGH (ref 38–126)
Anion gap: 7 (ref 5–15)
BUN: 10 mg/dL (ref 6–20)
CO2: 25 mmol/L (ref 22–32)
Calcium: 9 mg/dL (ref 8.9–10.3)
Chloride: 107 mmol/L (ref 98–111)
Creatinine: 0.93 mg/dL (ref 0.44–1.00)
GFR, Estimated: >60 mL/min (ref >60)
Glucose, Bld: 210 mg/dL — ABNORMAL HIGH (ref 70–99)
Potassium: 3.9 mmol/L (ref 3.5–5.1)
Sodium: 139 mmol/L (ref 135–145)
Total Bilirubin: 0.5 mg/dL (ref 0.3–1.2)
Total Protein: 6.6 g/dL (ref 6.5–8.1)

## 2021-12-20 LAB — MAGNESIUM: Magnesium: 1.7 mg/dL (ref 1.7–2.4)

## 2021-12-20 MED ORDER — HEPARIN SOD (PORK) LOCK FLUSH 100 UNIT/ML IV SOLN
500.0000 [IU] | Freq: Once | INTRAVENOUS | Status: AC | PRN
  Administered 2021-12-20: 500 [IU]

## 2021-12-20 MED ORDER — SODIUM CHLORIDE 0.9 % IV SOLN: Freq: Once | INTRAVENOUS | Status: AC

## 2021-12-20 MED ORDER — SODIUM CHLORIDE 0.9% FLUSH
10.0000 mL | INTRAVENOUS | Status: DC | PRN
  Administered 2021-12-20: 10 mL

## 2021-12-20 MED ORDER — SODIUM CHLORIDE 0.9 % IV SOLN
1000.0000 mg/m2 | Freq: Once | INTRAVENOUS | Status: AC
  Administered 2021-12-20: 1976 mg via INTRAVENOUS
  Filled 2021-12-20: qty 51.97

## 2021-12-20 MED ORDER — PROCHLORPERAZINE MALEATE 10 MG PO TABS
10.0000 mg | ORAL_TABLET | Freq: Once | ORAL | Status: AC
  Administered 2021-12-20: 10 mg via ORAL
  Filled 2021-12-20: qty 1

## 2021-12-20 NOTE — Progress Notes (Signed)
[place] OFFICE PROGRESS NOTE   Diagnosis: Pancreas cancer  INTERVAL HISTORY:   Alyssa Ross returns as scheduled.  She completed another cycle Gemcitabine 12/06/2021.  She denies nausea/vomiting.  No mouth sores.  No diarrhea.  No fever or rash after treatment.  She noted partial improvement in the skin rash with doxycycline.  Objective:  Vital signs in last 24 hours:  Blood pressure 129/87, pulse 89, temperature 98.1 F (36.7 C), temperature source Oral, resp. rate 20, height 5' 7"  (1.702 m), weight 187 lb (84.8 kg), last menstrual period 09/17/2012, SpO2 100 %.    HEENT: No thrush or ulcers. Resp: Lungs clear bilaterally. Cardio: Regular rate and rhythm. GI: Abdomen soft and nontender.  No hepatosplenomegaly. Vascular: No leg edema. Skin: Hyperpigmented raised rash face and neck. Port-A-Cath without erythema.   Lab Results:  Lab Results  Component Value Date   WBC 4.0 12/20/2021   HGB 9.7 (L) 12/20/2021   HCT 30.7 (L) 12/20/2021   MCV 91.1 12/20/2021   PLT 251 12/20/2021   NEUTROABS 2.3 12/20/2021    Imaging:  No results found.  Medications: I have reviewed the patient's current medications.  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 Tumor invades the duodenal wall and vascular groove, resection margins negative, 4/34 lymph nodes positive MSI-stable, tumor showed instability in 2 loci as did adjacent normal tissue EUS FNA biopsy of pancreas mass on 07/03/2018-well-differentiated neuroendocrine tumor CTs 01/29/2019-ill-defined pancreas head mass, 5 pulmonary nodules-1 with a small amount of central cavitation, tumor abuts the left margin of the portal vein indistinct density surrounding, hepatic artery, complex cystic lesion of the right kidney, right adrenal mass-characterized as an adenoma on a [facility] MRI 12/21/2018 [facility] 03/03/2019-no focal pancreas activity, no tracer accumulation within  the suspicious pulmonary nodules, left uterine mass with tracer accumulation felt to represent a leiomyoma Elevated preoperative CA 19-9--CA 19-9 186 on 01/18/2019 CT chest 04/16/2019-multiple bilateral pulmonary nodules, some with increased cavitation, stable in size Cycle 1 FOLFIRINOX 04/27/2019 Cycle 2 FOLFIRINOX 05/11/2019 Cycle 3 FOLFIRINOX 05/23/2019 Cycle 4 FOLFIRINOX 06/08/2019 Cycle 5 FOLFIRINOX 06/22/2019 CT chest 07/02/2019-stable size of bilateral pulmonary nodules.  Dominant cavitary lesions in both lungs show increased cavitation with thinner walls.  Stable 2.1 cm right adrenal nodule. 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 No definite evidence of metastatic disease in the abdomen or pelvis. Markedly enlarged and heterogeneous appearing uterus, likely to represent multifocal fibroids. 1 of these lesions appears to be an exophytic subserosal fibroid in the posterior lateral aspect of the uterine body on the left side although this comes in close proximity to the left adnexa such that a primary ovarian lesion is difficult to completely exclude. CTs 02/07/2020-slight enlargement of bilateral lung nodules, some are cavitary, no evidence of metastatic disease in the abdomen or pelvis, stable right adrenal nodule, uterine fibroids CTs 04/26/2020-mild enlargement of pulmonary nodules, slight increase in trace pelvic fluid, new soft tissue thickening inferior to the cecal tip suspicious for peritoneal metastasis CT 05/26/2020-improved appearance of soft tissue at the inferior tip of  the cecum, mildly thickened short appendix-findings suggestive of resolving appendicitis, stable small bibasilar pulmonary nodules, fibroids Plan biopsy  of right cecal tip soft tissue canceled secondary to radiologic improvement CT chest 08/02/2020-enlargement and progressive cavitation of multiple bilateral lung nodules.  Some new nodules are present. CTs 10/24/2020- increase in size of pulmonary nodules, no new nodules, no evidence of metastatic disease in the abdomen, stable right adrenal nodule CT 01/09/2021-slight interval enlargement of pulmonary nodules, stable right adrenal nodule Navigation bronchoscopy 01/30/2021-left lower lobe cavitary nodule FNA-adenocarcinoma, brushing-adenocarcinoma.  Left lower lobe lavage-adenocarcinoma.  Right upper lobe brushing and FNA biopsy of cavitary nodule-adenocarcinoma-immunohistochemical profile consistent with pancreas adenocarcinoma Cycle 1 gemcitabine/Abraxane 03/28/2021 Cycle 2 gemcitabine/Abraxane 04/11/2021 Cycle 3 gemcitabine/Abraxane 04/25/2021 Cycle 4 gemcitabine/Abraxane 05/09/2021 Cycle 5 gemcitabine/Abraxane 05/23/2021 CT chest 06/05/2021-interval cavitation of multiple pulmonary nodules, some nodules have decreased in size, no new or enlarging nodules Cycle 6 gemcitabine/Abraxane 06/06/2021 Cycle 7 gemcitabine/Abraxane 06/21/2021 Cycle 8 gemcitabine/Abraxane 07/05/2021 Cycle 9 Gemcitabine/Abraxane 07/19/2021 Cycle 10 gemcitabine 08/01/2021-Abraxane held secondary to neuropathy CT chest 08/13/2021-mild decrease in size and wall thickness of multiple cavitary nodules, no new or progressive disease in the chest, indeterminate low-attenuation right liver lesions Cycle 11 gemcitabine 08/16/2021-Abraxane held secondary to neuropathy Cycle 12 gemcitabine 08/30/2021-Abraxane held secondary to neuropathy Cycle 13 gemcitabine 09/13/2021-Abraxane held secondary to neuropathy Cycle 14 gemcitabine 09/27/2021-Abraxane held secondary to neuropathy Cycle 15 gemcitabine  10/11/2021-Abraxane held secondary to neuropathy CTs 10/22/2021-no change in multiple cavitary lung nodules, no evidence of disease progression, ill-defined hypodense lesion in the posterior right liver suspicious for metastatic disease Cycle 16 gemcitabine 10/25/2021 Cycle 17 gemcitabine 11/08/2021 Cycle 18 Gemcitabine 11/22/2021 Cycle 19 gemcitabine 12/06/2021 Cycle 20 Gemcitabine 12/20/2021   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  She continues active treatment with gemcitabine every 2 weeks.  There is no clinical evidence of disease progression.  Plan to proceed with Gemcitabine today as scheduled.  Restaging CTs prior to next office visit.  CBC and chemistry panel reviewed.  Labs adequate to proceed as above.  She will return for follow-up in 2 weeks.  We are available to see her sooner if needed.    Ned Card ANP/GNP-BC   12/20/2021  10:40 AM

## 2021-12-20 NOTE — Patient Instructions (Signed)
Gemcitabine injection What is this medication? GEMCITABINE (jem SYE ta been) is a chemotherapy drug. This medicine is used to treat many types of cancer like breast cancer, lung cancer, pancreatic cancer, and ovarian cancer. This medicine may be used for other purposes; ask your health care provider or pharmacist if you have questions. COMMON BRAND NAME(S): Gemzar, Infugem What should I tell my care team before I take this medication? They need to know if you have any of these conditions: blood disorders infection kidney disease liver disease lung or breathing disease, like asthma recent or ongoing radiation therapy an unusual or allergic reaction to gemcitabine, other chemotherapy, other medicines, foods, dyes, or preservatives pregnant or trying to get pregnant breast-feeding How should I use this medication? This drug is given as an infusion into a vein. It is administered in a hospital or clinic by a specially trained health care professional. Talk to your pediatrician regarding the use of this medicine in children. Special care may be needed. Overdosage: If you think you have taken too much of this medicine contact a poison control center or emergency room at once. NOTE: This medicine is only for you. Do not share this medicine with others. What if I miss a dose? It is important not to miss your dose. Call your doctor or health care professional if you are unable to keep an appointment. What may interact with this medication? medicines to increase blood counts like filgrastim, pegfilgrastim, sargramostim some other chemotherapy drugs like cisplatin vaccines Talk to your doctor or health care professional before taking any of these medicines: acetaminophen aspirin ibuprofen ketoprofen naproxen This list may not describe all possible interactions. Give your health care provider a list of all the medicines, herbs, non-prescription drugs, or dietary supplements you use. Also tell  them if you smoke, drink alcohol, or use illegal drugs. Some items may interact with your medicine. What should I watch for while using this medication? Visit your doctor for checks on your progress. This drug may make you feel generally unwell. This is not uncommon, as chemotherapy can affect healthy cells as well as cancer cells. Report any side effects. Continue your course of treatment even though you feel ill unless your doctor tells you to stop. In some cases, you may be given additional medicines to help with side effects. Follow all directions for their use. Call your doctor or health care professional for advice if you get a fever, chills or sore throat, or other symptoms of a cold or flu. Do not treat yourself. This drug decreases your body's ability to fight infections. Try to avoid being around people who are sick. This medicine may increase your risk to bruise or bleed. Call your doctor or health care professional if you notice any unusual bleeding. Be careful brushing and flossing your teeth or using a toothpick because you may get an infection or bleed more easily. If you have any dental work done, tell your dentist you are receiving this medicine. Avoid taking products that contain aspirin, acetaminophen, ibuprofen, naproxen, or ketoprofen unless instructed by your doctor. These medicines may hide a fever. Do not become pregnant while taking this medicine or for 6 months after stopping it. Women should inform their doctor if they wish to become pregnant or think they might be pregnant. Men should not father a child while taking this medicine and for 3 months after stopping it. There is a potential for serious side effects to an unborn child. Talk to your health care professional  or pharmacist for more information. Do not breast-feed an infant while taking this medicine or for at least 1 week after stopping it. Men should inform their doctors if they wish to father a child. This medicine may  lower sperm counts. Talk with your doctor or health care professional if you are concerned about your fertility. What side effects may I notice from receiving this medication? Side effects that you should report to your doctor or health care professional as soon as possible: allergic reactions like skin rash, itching or hives, swelling of the face, lips, or tongue breathing problems pain, redness, or irritation at site where injected signs and symptoms of a dangerous change in heartbeat or heart rhythm like chest pain; dizziness; fast or irregular heartbeat; palpitations; feeling faint or lightheaded, falls; breathing problems signs of decreased platelets or bleeding - bruising, pinpoint red spots on the skin, black, tarry stools, blood in the urine signs of decreased red blood cells - unusually weak or tired, feeling faint or lightheaded, falls signs of infection - fever or chills, cough, sore throat, pain or difficulty passing urine signs and symptoms of kidney injury like trouble passing urine or change in the amount of urine signs and symptoms of liver injury like dark yellow or brown urine; general ill feeling or flu-like symptoms; light-colored stools; loss of appetite; nausea; right upper belly pain; unusually weak or tired; yellowing of the eyes or skin swelling of ankles, feet, hands Side effects that usually do not require medical attention (report to your doctor or health care professional if they continue or are bothersome): constipation diarrhea hair loss loss of appetite nausea rash vomiting This list may not describe all possible side effects. Call your doctor for medical advice about side effects. You may report side effects to FDA at 1-800-FDA-1088. Where should I keep my medication? This drug is given in a hospital or clinic and will not be stored at home. NOTE: This sheet is a summary. It may not cover all possible information. If you have questions about this medicine,  talk to your doctor, pharmacist, or health care provider.  2023 Elsevier/Gold Standard (2017-08-20 00:00:00)

## 2021-12-20 NOTE — Progress Notes (Signed)
Patient seen by Lisa [name] NP today  Vitals are within treatment parameters.  Labs reviewed by Lisa [name] NP and are within treatment parameters.  Per physician team, patient is ready for treatment and there are NO modifications to the treatment plan.     

## 2021-12-21 LAB — CANCER ANTIGEN 19-9: CA 19-9: 38 U/mL — ABNORMAL HIGH (ref 0–35)

## 2021-12-29 ENCOUNTER — Other Ambulatory Visit: Payer: Self-pay | Admitting: Oncology

## 2021-12-31 ENCOUNTER — Ambulatory Visit (HOSPITAL_BASED_OUTPATIENT_CLINIC_OR_DEPARTMENT_OTHER)
Admission: RE | Admit: 2021-12-31 | Discharge: 2021-12-31 | Disposition: A | Discharge status: Home / Self Care | Payer: BC Managed Care – PPO | Source: Ambulatory Visit | Attending: Nurse Practitioner | Admitting: Nurse Practitioner

## 2021-12-31 ENCOUNTER — Other Ambulatory Visit: Payer: Self-pay

## 2021-12-31 ENCOUNTER — Encounter (HOSPITAL_BASED_OUTPATIENT_CLINIC_OR_DEPARTMENT_OTHER): Payer: Self-pay

## 2021-12-31 DIAGNOSIS — C259 Malignant neoplasm of pancreas, unspecified: Secondary | ICD-10-CM | POA: Diagnosis not present

## 2021-12-31 DIAGNOSIS — C250 Malignant neoplasm of head of pancreas: Secondary | ICD-10-CM | POA: Diagnosis not present

## 2021-12-31 DIAGNOSIS — C7800 Secondary malignant neoplasm of unspecified lung: Secondary | ICD-10-CM | POA: Diagnosis not present

## 2021-12-31 MED ORDER — IOHEXOL 300 MG/ML  SOLN
100.0000 mL | Freq: Once | INTRAMUSCULAR | Status: AC | PRN
  Administered 2021-12-31: 80 mL via INTRAVENOUS

## 2022-01-03 ENCOUNTER — Encounter: Payer: Self-pay | Admitting: *Deleted

## 2022-01-03 ENCOUNTER — Inpatient Hospital Stay: Payer: BC Managed Care – PPO

## 2022-01-03 ENCOUNTER — Inpatient Hospital Stay (HOSPITAL_BASED_OUTPATIENT_CLINIC_OR_DEPARTMENT_OTHER): Payer: BC Managed Care – PPO | Admitting: Oncology

## 2022-01-03 VITALS — BP 110/73 | HR 64 | Temp 98.5°F | Resp 18

## 2022-01-03 VITALS — BP 113/74 | HR 76 | Temp 98.2°F | Resp 18 | Ht 67.0 in | Wt 189.0 lb

## 2022-01-03 DIAGNOSIS — C250 Malignant neoplasm of head of pancreas: Secondary | ICD-10-CM | POA: Diagnosis not present

## 2022-01-03 DIAGNOSIS — C7801 Secondary malignant neoplasm of right lung: Secondary | ICD-10-CM | POA: Diagnosis not present

## 2022-01-03 DIAGNOSIS — E1140 Type 2 diabetes mellitus with diabetic neuropathy, unspecified: Secondary | ICD-10-CM | POA: Diagnosis not present

## 2022-01-03 DIAGNOSIS — I10 Essential (primary) hypertension: Secondary | ICD-10-CM | POA: Diagnosis not present

## 2022-01-03 DIAGNOSIS — Z90411 Acquired partial absence of pancreas: Secondary | ICD-10-CM | POA: Diagnosis not present

## 2022-01-03 DIAGNOSIS — Z79899 Other long term (current) drug therapy: Secondary | ICD-10-CM | POA: Diagnosis not present

## 2022-01-03 DIAGNOSIS — N6321 Unspecified lump in the left breast, upper outer quadrant: Secondary | ICD-10-CM | POA: Diagnosis not present

## 2022-01-03 DIAGNOSIS — Z905 Acquired absence of kidney: Secondary | ICD-10-CM | POA: Diagnosis not present

## 2022-01-03 DIAGNOSIS — Z5111 Encounter for antineoplastic chemotherapy: Secondary | ICD-10-CM | POA: Diagnosis not present

## 2022-01-03 DIAGNOSIS — C7802 Secondary malignant neoplasm of left lung: Secondary | ICD-10-CM | POA: Diagnosis not present

## 2022-01-03 LAB — CBC WITH DIFFERENTIAL (CANCER CENTER ONLY)
Abs Immature Granulocytes: 0.02 10*3/uL (ref 0.00–0.07)
Basophils Absolute: 0 10*3/uL (ref 0.0–0.1)
Basophils Relative: 1 %
Eosinophils Absolute: 0.1 10*3/uL (ref 0.0–0.5)
Eosinophils Relative: 2 %
HCT: 30.4 % — ABNORMAL LOW (ref 36.0–46.0)
Hemoglobin: 9.6 g/dL — ABNORMAL LOW (ref 12.0–15.0)
Immature Granulocytes: 1 %
Lymphocytes Relative: 22 %
Lymphs Abs: 0.9 10*3/uL (ref 0.7–4.0)
MCH: 29 pg (ref 26.0–34.0)
MCHC: 31.6 g/dL (ref 30.0–36.0)
MCV: 91.8 fL (ref 80.0–100.0)
Monocytes Absolute: 0.6 10*3/uL (ref 0.1–1.0)
Monocytes Relative: 14 %
Neutro Abs: 2.7 10*3/uL (ref 1.7–7.7)
Neutrophils Relative %: 60 %
Platelet Count: 282 10*3/uL (ref 150–400)
RBC: 3.31 MIL/uL — ABNORMAL LOW (ref 3.87–5.11)
RDW: 16.1 % — ABNORMAL HIGH (ref 11.5–15.5)
WBC Count: 4.3 10*3/uL (ref 4.0–10.5)
nRBC: 0 % (ref 0.0–0.2)

## 2022-01-03 LAB — CMP (CANCER CENTER ONLY)
ALT: 32 U/L (ref 0–44)
AST: 28 U/L (ref 15–41)
Albumin: 3.8 g/dL (ref 3.5–5.0)
Alkaline Phosphatase: 216 U/L — ABNORMAL HIGH (ref 38–126)
Anion gap: 8 (ref 5–15)
BUN: 9 mg/dL (ref 6–20)
CO2: 22 mmol/L (ref 22–32)
Calcium: 9.1 mg/dL (ref 8.9–10.3)
Chloride: 108 mmol/L (ref 98–111)
Creatinine: 1.06 mg/dL — ABNORMAL HIGH (ref 0.44–1.00)
GFR, Estimated: >60 mL/min (ref >60)
Glucose, Bld: 160 mg/dL — ABNORMAL HIGH (ref 70–99)
Potassium: 4 mmol/L (ref 3.5–5.1)
Sodium: 138 mmol/L (ref 135–145)
Total Bilirubin: 0.4 mg/dL (ref 0.3–1.2)
Total Protein: 6.9 g/dL (ref 6.5–8.1)

## 2022-01-03 MED ORDER — SODIUM CHLORIDE 0.9 % IV SOLN
1000.0000 mg/m2 | Freq: Once | INTRAVENOUS | Status: AC
  Administered 2022-01-03: 1976 mg via INTRAVENOUS
  Filled 2022-01-03: qty 51.97

## 2022-01-03 MED ORDER — HEPARIN SOD (PORK) LOCK FLUSH 100 UNIT/ML IV SOLN
500.0000 [IU] | Freq: Once | INTRAVENOUS | Status: AC | PRN
  Administered 2022-01-03: 500 [IU]

## 2022-01-03 MED ORDER — PACLITAXEL PROTEIN-BOUND CHEMO INJECTION 100 MG
100.0000 mg/m2 | Freq: Once | INTRAVENOUS | Status: AC
  Administered 2022-01-03: 200 mg via INTRAVENOUS
  Filled 2022-01-03: qty 40

## 2022-01-03 MED ORDER — PROCHLORPERAZINE MALEATE 10 MG PO TABS
10.0000 mg | ORAL_TABLET | Freq: Once | ORAL | Status: AC
  Administered 2022-01-03: 10 mg via ORAL
  Filled 2022-01-03: qty 1

## 2022-01-03 MED ORDER — SODIUM CHLORIDE 0.9 % IV SOLN: Freq: Once | INTRAVENOUS | Status: AC

## 2022-01-03 MED ORDER — SODIUM CHLORIDE 0.9% FLUSH
10.0000 mL | INTRAVENOUS | Status: DC | PRN
  Administered 2022-01-03: 10 mL

## 2022-01-03 NOTE — Progress Notes (Signed)
Per Dr. Benay Spice request: Email to [facility] requesting [facility] testing on case #[id number] dated 03/04/2019. 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

## 2022-01-03 NOTE — Progress Notes (Signed)
Cryotherapy to hands and feet per patient request. Discussed caution due to existing neuropathy and decreased feeling if too cold.

## 2022-01-03 NOTE — Progress Notes (Signed)
Patient seen by Dr. Benay Spice today  Vitals are within treatment parameters.  Labs reviewed by Dr. Benay Spice and are within treatment parameters.  Per physician team, patient is ready for treatment. Please note that modifications are being made to the treatment plan including Adding Abraxane back to regimen and patient wishes to ice fingers/feet

## 2022-01-03 NOTE — Patient Instructions (Signed)
[place]  Discharge Instructions: Thank you for choosing [facility] to provide your oncology and hematology care.   If you have a lab appointment with the [facility], please go directly to the [facility] and check in at the registration area.   Wear comfortable clothing and clothing appropriate for easy access to any Portacath or PICC line.   We strive to give you quality time with your provider. You may need to reschedule your appointment if you arrive late (15 or more minutes).  Arriving late affects you and other patients whose appointments are after yours.  Also, if you miss three or more appointments without notifying the office, you may be dismissed from the clinic at the provider's discretion.      For prescription refill requests, have your pharmacy contact our office and allow 72 hours for refills to be completed.    Today you received the following chemotherapy and/or immunotherapy agents Gemzar      To help prevent nausea and vomiting after your treatment, we encourage you to take your nausea medication as directed.  BELOW ARE SYMPTOMS THAT SHOULD BE REPORTED IMMEDIATELY: *FEVER GREATER THAN 100.4 F (38 C) OR HIGHER *CHILLS OR SWEATING *NAUSEA AND VOMITING THAT IS NOT CONTROLLED WITH YOUR NAUSEA MEDICATION *UNUSUAL SHORTNESS OF BREATH *UNUSUAL BRUISING OR BLEEDING *URINARY PROBLEMS (pain or burning when urinating, or frequent urination) *BOWEL PROBLEMS (unusual diarrhea, constipation, pain near the anus) TENDERNESS IN MOUTH AND THROAT WITH OR WITHOUT PRESENCE OF ULCERS (sore throat, sores in mouth, or a toothache) UNUSUAL RASH, SWELLING OR PAIN  UNUSUAL VAGINAL DISCHARGE OR ITCHING   Items with * indicate a potential emergency and should be followed up as soon as possible or go to the Emergency Department if any problems should occur.  Please show the CHEMOTHERAPY ALERT CARD or IMMUNOTHERAPY ALERT CARD at check-in to the  Emergency Department and triage nurse.  Should you have questions after your visit or need to cancel or reschedule your appointment, please contact [facility]  Dept: [phone]  and follow the prompts.  Office hours are 8:00 a.m. to 4:30 p.m. Monday - Friday. Please note that voicemails left after 4:00 p.m. may not be returned until the following business day.  We are closed weekends and major holidays. You have access to a nurse at all times for urgent questions. Please call the main number to the clinic Dept: [phone] and follow the prompts.   For any non-urgent questions, you may also contact your provider using MyChart. We now offer e-Visits for anyone 46 and older to request care online for non-urgent symptoms. For details visit [email or web address].   Also download the MyChart app! Go to the app store, search "MyChart", open the app, select [facility], and log in with your MyChart username and password.  Masks are optional in the cancer centers. If you would like for your care team to wear a mask while they are taking care of you, please let them know. For doctor visits, patients may have with them one support person who is at least 56 years old. At this time, visitors are not allowed in the infusion area. Gemcitabine injection What is this medication? GEMCITABINE (jem SYE ta been) is a chemotherapy drug. This medicine is used to treat many types of cancer like breast cancer, lung cancer, pancreatic cancer, and ovarian cancer. This medicine may be used for other purposes; ask your health care provider or pharmacist if  you have questions. COMMON BRAND NAME(S): Gemzar, Infugem What should I tell my care team before I take this medication? They need to know if you have any of these conditions: blood disorders infection kidney disease liver disease lung or breathing disease, like asthma recent or ongoing radiation therapy an unusual or allergic reaction  to gemcitabine, other chemotherapy, other medicines, foods, dyes, or preservatives pregnant or trying to get pregnant breast-feeding How should I use this medication? This drug is given as an infusion into a vein. It is administered in a hospital or clinic by a specially trained health care professional. Talk to your pediatrician regarding the use of this medicine in children. Special care may be needed. Overdosage: If you think you have taken too much of this medicine contact a poison control center or emergency room at once. NOTE: This medicine is only for you. Do not share this medicine with others. What if I miss a dose? It is important not to miss your dose. Call your doctor or health care professional if you are unable to keep an appointment. What may interact with this medication? medicines to increase blood counts like filgrastim, pegfilgrastim, sargramostim some other chemotherapy drugs like cisplatin vaccines Talk to your doctor or health care professional before taking any of these medicines: acetaminophen aspirin ibuprofen ketoprofen naproxen This list may not describe all possible interactions. Give your health care provider a list of all the medicines, herbs, non-prescription drugs, or dietary supplements you use. Also tell them if you smoke, drink alcohol, or use illegal drugs. Some items may interact with your medicine. What should I watch for while using this medication? Visit your doctor for checks on your progress. This drug may make you feel generally unwell. This is not uncommon, as chemotherapy can affect healthy cells as well as cancer cells. Report any side effects. Continue your course of treatment even though you feel ill unless your doctor tells you to stop. In some cases, you may be given additional medicines to help with side effects. Follow all directions for their use. Call your doctor or health care professional for advice if you get a fever, chills or sore  throat, or other symptoms of a cold or flu. Do not treat yourself. This drug decreases your body's ability to fight infections. Try to avoid being around people who are sick. This medicine may increase your risk to bruise or bleed. Call your doctor or health care professional if you notice any unusual bleeding. Be careful brushing and flossing your teeth or using a toothpick because you may get an infection or bleed more easily. If you have any dental work done, tell your dentist you are receiving this medicine. Avoid taking products that contain aspirin, acetaminophen, ibuprofen, naproxen, or ketoprofen unless instructed by your doctor. These medicines may hide a fever. Do not become pregnant while taking this medicine or for 6 months after stopping it. Women should inform their doctor if they wish to become pregnant or think they might be pregnant. Men should not father a child while taking this medicine and for 3 months after stopping it. There is a potential for serious side effects to an unborn child. Talk to your health care professional or pharmacist for more information. Do not breast-feed an infant while taking this medicine or for at least 1 week after stopping it. Men should inform their doctors if they wish to father a child. This medicine may lower sperm counts. Talk with your doctor or health care professional if  you are concerned about your fertility. What side effects may I notice from receiving this medication? Side effects that you should report to your doctor or health care professional as soon as possible: allergic reactions like skin rash, itching or hives, swelling of the face, lips, or tongue breathing problems pain, redness, or irritation at site where injected signs and symptoms of a dangerous change in heartbeat or heart rhythm like chest pain; dizziness; fast or irregular heartbeat; palpitations; feeling faint or lightheaded, falls; breathing problems signs of decreased  platelets or bleeding - bruising, pinpoint red spots on the skin, black, tarry stools, blood in the urine signs of decreased red blood cells - unusually weak or tired, feeling faint or lightheaded, falls signs of infection - fever or chills, cough, sore throat, pain or difficulty passing urine signs and symptoms of kidney injury like trouble passing urine or change in the amount of urine signs and symptoms of liver injury like dark yellow or brown urine; general ill feeling or flu-like symptoms; light-colored stools; loss of appetite; nausea; right upper belly pain; unusually weak or tired; yellowing of the eyes or skin swelling of ankles, feet, hands Side effects that usually do not require medical attention (report to your doctor or health care professional if they continue or are bothersome): constipation diarrhea hair loss loss of appetite nausea rash vomiting This list may not describe all possible side effects. Call your doctor for medical advice about side effects. You may report side effects to FDA at 1-800-FDA-1088. Where should I keep my medication? This drug is given in a hospital or clinic and will not be stored at home. NOTE: This sheet is a summary. It may not cover all possible information. If you have questions about this medicine, talk to your doctor, pharmacist, or health care provider.  2023 Elsevier/Gold Standard (2017-08-20 00:00:00) Nanoparticle Albumin-Bound Paclitaxel injection What is this medication? NANOPARTICLE ALBUMIN-BOUND PACLITAXEL (Na no PAHR ti kuhl  al BYOO muhn-bound  PAK li TAX el) is a chemotherapy drug. It targets fast dividing cells, like cancer cells, and causes these cells to die. This medicine is used to treat advanced breast cancer, lung cancer, and pancreatic cancer. This medicine may be used for other purposes; ask your health care provider or pharmacist if you have questions. COMMON BRAND NAME(S): Abraxane What should I tell my care team before  I take this medication? They need to know if you have any of these conditions: kidney disease liver disease low blood counts, like low white cell, platelet, or red cell counts lung or breathing disease, like asthma tingling of the fingers or toes, or other nerve disorder an unusual or allergic reaction to paclitaxel, albumin, other chemotherapy, other medicines, foods, dyes, or preservatives pregnant or trying to get pregnant breast-feeding How should I use this medication? This drug is given as an infusion into a vein. It is administered in a hospital or clinic by a specially trained health care professional. Talk to your pediatrician regarding the use of this medicine in children. Special care may be needed. Overdosage: If you think you have taken too much of this medicine contact a poison control center or emergency room at once. NOTE: This medicine is only for you. Do not share this medicine with others. What if I miss a dose? It is important not to miss your dose. Call your doctor or health care professional if you are unable to keep an appointment. What may interact with this medication? This medicine may interact with the  following medications: antiviral medicines for hepatitis, HIV or AIDS certain antibiotics like erythromycin and clarithromycin certain medicines for fungal infections like ketoconazole and itraconazole certain medicines for seizures like carbamazepine, phenobarbital, phenytoin gemfibrozil nefazodone rifampin St. John's wort This list may not describe all possible interactions. Give your health care provider a list of all the medicines, herbs, non-prescription drugs, or dietary supplements you use. Also tell them if you smoke, drink alcohol, or use illegal drugs. Some items may interact with your medicine. What should I watch for while using this medication? Your condition will be monitored carefully while you are receiving this medicine. You will need important  blood work done while you are taking this medicine. This medicine can cause serious allergic reactions. If you experience allergic reactions like skin rash, itching or hives, swelling of the face, lips, or tongue, tell your doctor or health care professional right away. In some cases, you may be given additional medicines to help with side effects. Follow all directions for their use. This drug may make you feel generally unwell. This is not uncommon, as chemotherapy can affect healthy cells as well as cancer cells. Report any side effects. Continue your course of treatment even though you feel ill unless your doctor tells you to stop. Call your doctor or health care professional for advice if you get a fever, chills or sore throat, or other symptoms of a cold or flu. Do not treat yourself. This drug decreases your body's ability to fight infections. Try to avoid being around people who are sick. This medicine may increase your risk to bruise or bleed. Call your doctor or health care professional if you notice any unusual bleeding. Be careful brushing and flossing your teeth or using a toothpick because you may get an infection or bleed more easily. If you have any dental work done, tell your dentist you are receiving this medicine. Avoid taking products that contain aspirin, acetaminophen, ibuprofen, naproxen, or ketoprofen unless instructed by your doctor. These medicines may hide a fever. Do not become pregnant while taking this medicine or for 6 months after stopping it. Women should inform their doctor if they wish to become pregnant or think they might be pregnant. Men should not father a child while taking this medicine or for 3 months after stopping it. There is a potential for serious side effects to an unborn child. Talk to your health care professional or pharmacist for more information. Do not breast-feed an infant while taking this medicine or for 2 weeks after stopping it. This medicine may  interfere with the ability to get pregnant or to father a child. You should talk to your doctor or health care professional if you are concerned about your fertility. What side effects may I notice from receiving this medication? Side effects that you should report to your doctor or health care professional as soon as possible: allergic reactions like skin rash, itching or hives, swelling of the face, lips, or tongue breathing problems changes in vision fast, irregular heartbeat low blood pressure mouth sores pain, tingling, numbness in the hands or feet signs of decreased platelets or bleeding - bruising, pinpoint red spots on the skin, black, tarry stools, blood in the urine signs of decreased red blood cells - unusually weak or tired, feeling faint or lightheaded, falls signs of infection - fever or chills, cough, sore throat, pain or difficulty passing urine signs and symptoms of liver injury like dark yellow or brown urine; general ill feeling or flu-like symptoms;  light-colored stools; loss of appetite; nausea; right upper belly pain; unusually weak or tired; yellowing of the eyes or skin swelling of the ankles, feet, hands unusually slow heartbeat Side effects that usually do not require medical attention (report to your doctor or health care professional if they continue or are bothersome): diarrhea hair loss loss of appetite nausea, vomiting tiredness This list may not describe all possible side effects. Call your doctor for medical advice about side effects. You may report side effects to FDA at 1-800-FDA-1088. Where should I keep my medication? This drug is given in a hospital or clinic and will not be stored at home. NOTE: This sheet is a summary. It may not cover all possible information. If you have questions about this medicine, talk to your doctor, pharmacist, or health care provider.  2023 Elsevier/Gold Standard (2017-01-28 00:00:00)

## 2022-01-03 NOTE — Progress Notes (Signed)
[facility] OFFICE PROGRESS NOTE   Diagnosis: Pancreas cancer  INTERVAL HISTORY:   Ms. Vardaman returns as scheduled.  She completed another cycle of gemcitabine on 12/20/2021.  She continues to have neuropathy symptoms, chiefly in the feet.  She describes numbness and tingling.  This does not interfere with activity.  She has milder symptoms in the hands.  This also does not interfere with activity.  No other complaint.  She is working.  Objective:  Vital signs in last 24 hours:  Blood pressure 113/74, pulse 76, temperature 98.2 F (36.8 C), temperature source Oral, resp. rate 18, height 5' 7"  (1.702 m), weight 189 lb (85.7 kg), last menstrual period 09/17/2012, SpO2 100 %.    HEENT: No thrush Resp: Lungs clear bilaterally Cardio: Regular rate and rhythm GI: No hepatosplenomegaly Vascular: No leg edema Skin: Hyperpigmented acne type rash over the face/neck and chest  Portacath/PICC-without erythema  Lab Results:  Lab Results  Component Value Date   WBC 4.3 01/03/2022   HGB 9.6 (L) 01/03/2022   HCT 30.4 (L) 01/03/2022   MCV 91.8 01/03/2022   PLT 282 01/03/2022   NEUTROABS 2.7 01/03/2022    CMP  Lab Results  Component Value Date   NA 138 01/03/2022   K 4.0 01/03/2022   CL 108 01/03/2022   CO2 22 01/03/2022   GLUCOSE 160 (H) 01/03/2022   BUN 9 01/03/2022   CREATININE 1.06 (H) 01/03/2022   CALCIUM 9.1 01/03/2022   PROT 6.9 01/03/2022   ALBUMIN 3.8 01/03/2022   AST 28 01/03/2022   ALT 32 01/03/2022   ALKPHOS 216 (H) 01/03/2022   BILITOT 0.4 01/03/2022   GFRNONAA >60 01/03/2022   GFRAA >60 02/07/2020    Lab Results  Component Value Date   CAN199 38 (H) 12/20/2021      Imaging:  CT CHEST ABDOMEN PELVIS W CONTRAST  Result Date: 01/01/2022 CLINICAL DATA:  Pancreatic carcinoma.  Assess treatment response. * Tracking Code: BO * EXAM: CT CHEST, ABDOMEN, AND PELVIS WITH CONTRAST TECHNIQUE: Multidetector CT imaging of the chest, abdomen and pelvis  was performed following the standard protocol during bolus administration of intravenous contrast. RADIATION DOSE REDUCTION: This exam was performed according to the departmental dose-optimization program which includes automated exposure control, adjustment of the mA and/or kV according to patient size and/or use of iterative reconstruction technique. CONTRAST:  46m OMNIPAQUE IOHEXOL 300 MG/ML  SOLN COMPARISON:  10/22/2021 FINDINGS: CT CHEST FINDINGS Cardiovascular: No acute findings. Aortic atherosclerotic calcification incidentally noted. Mediastinum/Lymph Nodes: No masses or pathologically enlarged lymph nodes identified. Lungs/Pleura: Diffuse bilateral pulmonary metastases are again seen, many of which are cavitary. Numerous pulmonary metastases show mild increase in size since previous study. Index cavitary nodule in the left lower lobe measures 2.2 x 2.0 cm on image 59/4, compared to 1.8 x 1.7 cm previously. Index nodule in the lateral right upper lobe measures 10 x 9 mm on image 47/4, compared to 8 x 7 mm previously. No evidence of pulmonary infiltrate or pleural effusion. Musculoskeletal:  No suspicious bone lesions identified. CT ABDOMEN AND PELVIS FINDINGS Hepatobiliary: Heterogeneous enhancement of the liver is noted. Again seen are 2 subtle measure due to poorly defined margins. These show no significant change, however liver metastases cannot be excluded. Pneumobilia noted, consistent with prior Whipple procedure. Pancreas: Postop changes from Whipple procedure again noted no soft tissue mass identified within the operative bed. Pancreatic duct stent remains in place in the remainder of the pancreas is unremarkable in appearance. Spleen:  Within normal limits in size and appearance. Adrenals/Urinary tract: Stable small benign right adrenal adenoma. Stable right renal parenchymal scarring. No renal masses or hydronephrosis. Stomach/Bowel: No evidence of obstruction, inflammatory process, or abnormal  fluid collections. Large amount of stool again seen throughout the colon. Vascular/Lymphatic: No pathologically enlarged lymph nodes identified. No acute vascular findings. Aortic atherosclerotic calcification incidentally noted. Reproductive: Multiple uterine fibroids are again seen, largest measuring 8.6 cm. Other:  None. Musculoskeletal:  No suspicious bone lesions identified. IMPRESSION: Mild increase in diffuse bilateral pulmonary metastases. Stable subtle low-attenuation lesions in posterior right hepatic lobe. Liver metastases cannot be excluded. Consider abdomen MRI without and with contrast for further evaluation. No other sites of metastatic disease within the abdomen or pelvis. Stable small benign right adrenal adenoma. Stable uterine fibroids. Large stool burden noted; recommend clinical correlation for possible constipation. Aortic Atherosclerosis (ICD10-I70.0). Electronically Signed   By: Marlaine Hind M.D.   On: 01/01/2022 10:44    Medications: I have reviewed the patient's current medications.   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 Tumor invades the duodenal wall and vascular groove, resection margins negative, 4/34 lymph nodes positive MSI-stable, tumor showed instability in 2 loci as did adjacent normal tissue EUS FNA biopsy of pancreas mass on 07/03/2018-well-differentiated neuroendocrine tumor CTs 01/29/2019-ill-defined pancreas head mass, 5 pulmonary nodules-1 with a small amount of central cavitation, tumor abuts the left margin of the portal vein indistinct density surrounding, hepatic artery, complex cystic lesion of the right kidney, right adrenal mass-characterized as an adenoma on a [facility] MRI 12/21/2018 [facility] 03/03/2019-no focal pancreas activity, no tracer accumulation within the suspicious pulmonary nodules, left uterine mass with tracer accumulation felt to represent a leiomyoma Elevated preoperative CA  19-9--CA 19-9 186 on 01/18/2019 CT chest 04/16/2019-multiple bilateral pulmonary nodules, some with increased cavitation, stable in size Cycle 1 FOLFIRINOX 04/27/2019 Cycle 2 FOLFIRINOX 05/11/2019 Cycle 3 FOLFIRINOX 05/23/2019 Cycle 4 FOLFIRINOX 06/08/2019 Cycle 5 FOLFIRINOX 06/22/2019 CT chest 07/02/2019-stable size of bilateral pulmonary nodules.  Dominant cavitary lesions in both lungs show increased cavitation with thinner walls.  Stable 2.1 cm right adrenal nodule. 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 No definite evidence of metastatic disease in the abdomen or pelvis. Markedly enlarged and heterogeneous appearing uterus, likely to represent multifocal fibroids. 1 of these lesions appears to be an exophytic subserosal fibroid in the posterior lateral aspect of the uterine body on the left side although this comes in close proximity to the left adnexa such that a primary ovarian lesion is difficult to completely exclude. CTs 02/07/2020-slight enlargement of bilateral lung nodules, some are cavitary, no evidence of metastatic disease in the abdomen or pelvis, stable right adrenal nodule, uterine fibroids CTs 04/26/2020-mild enlargement of pulmonary nodules, slight increase in trace pelvic fluid, new soft tissue thickening inferior to the cecal tip suspicious for peritoneal metastasis CT 05/26/2020-improved appearance of soft tissue at the inferior tip of the cecum, mildly thickened short appendix-findings suggestive of resolving appendicitis, stable small bibasilar pulmonary nodules,  fibroids Plan biopsy of right cecal tip soft tissue canceled secondary to radiologic improvement CT chest 08/02/2020-enlargement and progressive cavitation of multiple bilateral lung nodules.  Some new  nodules are present. CTs 10/24/2020- increase in size of pulmonary nodules, no new nodules, no evidence of metastatic disease in the abdomen, stable right adrenal nodule CT 01/09/2021-slight interval enlargement of pulmonary nodules, stable right adrenal nodule Navigation bronchoscopy 01/30/2021-left lower lobe cavitary nodule FNA-adenocarcinoma, brushing-adenocarcinoma.  Left lower lobe lavage-adenocarcinoma.  Right upper lobe brushing and FNA biopsy of cavitary nodule-adenocarcinoma-immunohistochemical profile consistent with pancreas adenocarcinoma Cycle 1 gemcitabine/Abraxane 03/28/2021 Cycle 2 gemcitabine/Abraxane 04/11/2021 Cycle 3 gemcitabine/Abraxane 04/25/2021 Cycle 4 gemcitabine/Abraxane 05/09/2021 Cycle 5 gemcitabine/Abraxane 05/23/2021 CT chest 06/05/2021-interval cavitation of multiple pulmonary nodules, some nodules have decreased in size, no new or enlarging nodules Cycle 6 gemcitabine/Abraxane 06/06/2021 Cycle 7 gemcitabine/Abraxane 06/21/2021 Cycle 8 gemcitabine/Abraxane 07/05/2021 Cycle 9 Gemcitabine/Abraxane 07/19/2021 Cycle 10 gemcitabine 08/01/2021-Abraxane held secondary to neuropathy CT chest 08/13/2021-mild decrease in size and wall thickness of multiple cavitary nodules, no new or progressive disease in the chest, indeterminate low-attenuation right liver lesions Cycle 11 gemcitabine 08/16/2021-Abraxane held secondary to neuropathy Cycle 12 gemcitabine 08/30/2021-Abraxane held secondary to neuropathy Cycle 13 gemcitabine 09/13/2021-Abraxane held secondary to neuropathy Cycle 14 gemcitabine 09/27/2021-Abraxane held secondary to neuropathy Cycle 15 gemcitabine 10/11/2021-Abraxane held secondary to neuropathy CTs 10/22/2021-no change in multiple cavitary lung nodules, no evidence of disease  progression, ill-defined hypodense lesion in the posterior right liver suspicious for metastatic disease Cycle 16 gemcitabine 10/25/2021 Cycle 17 gemcitabine 11/08/2021 Cycle 18 Gemcitabine 11/22/2021 Cycle 19 gemcitabine 12/06/2021 Cycle 20 Gemcitabine 12/20/2021 CT 12/31/2021-mild increase in size of bilateral pulmonary metastases, stable subtle continuation right liver lesions Cycle 20 gemcitabine/Abraxane 01/03/2022   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 Fiebelkorn has metastatic pancreas cancer.  I reviewed the CT findings and images with her.  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  She reports the neuropathy symptoms were present prior to beginning Abraxane, but slightly worsened while on Abraxane.  We discussed treatment options including observation, proceeding with gemcitabine/Abraxane, and changing to a liposomal irinotecan based regimen.  We also discussed referring her to consider clinical trials.  She does not wish to consider a clinical trial at present.  I recommend resuming gemcitabine/Abraxane since she responded to this regimen earlier this year.  She understands the potential for worsening of neuropathy and she understands neuropathy can be permanent.  She will complete a cycle of gemcitabine/Abraxane today.  She will return for an office visit and chemotherapy in 2 weeks.  We will follow her clinical status and the CA 19-9.  We will plan for a restaging chest CT in approximately 2 months.  [name],  MD  01/03/2022  9:47 AM

## 2022-01-05 LAB — CANCER ANTIGEN 19-9: CA 19-9: 53 U/mL — ABNORMAL HIGH (ref 0–35)

## 2022-01-13 ENCOUNTER — Other Ambulatory Visit: Payer: Self-pay | Admitting: Oncology

## 2022-01-14 DIAGNOSIS — C259 Malignant neoplasm of pancreas, unspecified: Secondary | ICD-10-CM | POA: Diagnosis not present

## 2022-01-17 ENCOUNTER — Inpatient Hospital Stay: Payer: BC Managed Care – PPO

## 2022-01-17 ENCOUNTER — Encounter: Payer: Self-pay | Admitting: *Deleted

## 2022-01-17 ENCOUNTER — Inpatient Hospital Stay: Payer: BC Managed Care – PPO | Attending: Genetic Counselor

## 2022-01-17 ENCOUNTER — Inpatient Hospital Stay (HOSPITAL_BASED_OUTPATIENT_CLINIC_OR_DEPARTMENT_OTHER): Payer: BC Managed Care – PPO | Admitting: Oncology

## 2022-01-17 ENCOUNTER — Other Ambulatory Visit: Payer: Self-pay | Admitting: *Deleted

## 2022-01-17 ENCOUNTER — Encounter (HOSPITAL_COMMUNITY): Payer: Self-pay

## 2022-01-17 VITALS — BP 122/88 | HR 79 | Temp 98.2°F | Resp 20 | Ht 67.0 in | Wt 190.0 lb

## 2022-01-17 DIAGNOSIS — E876 Hypokalemia: Secondary | ICD-10-CM

## 2022-01-17 DIAGNOSIS — C7802 Secondary malignant neoplasm of left lung: Secondary | ICD-10-CM | POA: Insufficient documentation

## 2022-01-17 DIAGNOSIS — C250 Malignant neoplasm of head of pancreas: Secondary | ICD-10-CM

## 2022-01-17 DIAGNOSIS — I10 Essential (primary) hypertension: Secondary | ICD-10-CM | POA: Insufficient documentation

## 2022-01-17 DIAGNOSIS — Z79899 Other long term (current) drug therapy: Secondary | ICD-10-CM | POA: Insufficient documentation

## 2022-01-17 DIAGNOSIS — E1140 Type 2 diabetes mellitus with diabetic neuropathy, unspecified: Secondary | ICD-10-CM | POA: Diagnosis not present

## 2022-01-17 DIAGNOSIS — N6320 Unspecified lump in the left breast, unspecified quadrant: Secondary | ICD-10-CM | POA: Insufficient documentation

## 2022-01-17 DIAGNOSIS — C7801 Secondary malignant neoplasm of right lung: Secondary | ICD-10-CM | POA: Diagnosis not present

## 2022-01-17 DIAGNOSIS — Z5111 Encounter for antineoplastic chemotherapy: Secondary | ICD-10-CM | POA: Insufficient documentation

## 2022-01-17 LAB — MAGNESIUM: Magnesium: 1.6 mg/dL — ABNORMAL LOW (ref 1.7–2.4)

## 2022-01-17 LAB — CBC WITH DIFFERENTIAL (CANCER CENTER ONLY)
Abs Immature Granulocytes: 0.02 10*3/uL (ref 0.00–0.07)
Basophils Absolute: 0 10*3/uL (ref 0.0–0.1)
Basophils Relative: 1 %
Eosinophils Absolute: 0.2 10*3/uL (ref 0.0–0.5)
Eosinophils Relative: 4 %
HCT: 28.4 % — ABNORMAL LOW (ref 36.0–46.0)
Hemoglobin: 9 g/dL — ABNORMAL LOW (ref 12.0–15.0)
Immature Granulocytes: 1 %
Lymphocytes Relative: 24 %
Lymphs Abs: 1 10*3/uL (ref 0.7–4.0)
MCH: 28.8 pg (ref 26.0–34.0)
MCHC: 31.7 g/dL (ref 30.0–36.0)
MCV: 91 fL (ref 80.0–100.0)
Monocytes Absolute: 0.6 10*3/uL (ref 0.1–1.0)
Monocytes Relative: 14 %
Neutro Abs: 2.4 10*3/uL (ref 1.7–7.7)
Neutrophils Relative %: 56 %
Platelet Count: 348 10*3/uL (ref 150–400)
RBC: 3.12 MIL/uL — ABNORMAL LOW (ref 3.87–5.11)
RDW: 15.9 % — ABNORMAL HIGH (ref 11.5–15.5)
WBC Count: 4.2 10*3/uL (ref 4.0–10.5)
nRBC: 0 % (ref 0.0–0.2)

## 2022-01-17 LAB — CMP (CANCER CENTER ONLY)
ALT: 33 U/L (ref 0–44)
AST: 29 U/L (ref 15–41)
Albumin: 3.2 g/dL — ABNORMAL LOW (ref 3.5–5.0)
Alkaline Phosphatase: 261 U/L — ABNORMAL HIGH (ref 38–126)
Anion gap: 8 (ref 5–15)
BUN: 7 mg/dL (ref 6–20)
CO2: 22 mmol/L (ref 22–32)
Calcium: 8.4 mg/dL — ABNORMAL LOW (ref 8.9–10.3)
Chloride: 114 mmol/L — ABNORMAL HIGH (ref 98–111)
Creatinine: 1.07 mg/dL — ABNORMAL HIGH (ref 0.44–1.00)
GFR, Estimated: >60 mL/min (ref >60)
Glucose, Bld: 97 mg/dL (ref 70–99)
Potassium: 3.2 mmol/L — ABNORMAL LOW (ref 3.5–5.1)
Sodium: 144 mmol/L (ref 135–145)
Total Bilirubin: 0.4 mg/dL (ref 0.3–1.2)
Total Protein: 6.7 g/dL (ref 6.5–8.1)

## 2022-01-17 MED ORDER — MAGNESIUM SULFATE 2 GM/50ML IV SOLN
2.0000 g | Freq: Once | INTRAVENOUS | Status: AC
  Administered 2022-01-17: 2 g via INTRAVENOUS

## 2022-01-17 MED ORDER — SODIUM CHLORIDE 0.9 % IV SOLN
1000.0000 mg/m2 | Freq: Once | INTRAVENOUS | Status: AC
  Administered 2022-01-17: 2014 mg via INTRAVENOUS
  Filled 2022-01-17: qty 52.6

## 2022-01-17 MED ORDER — PACLITAXEL PROTEIN-BOUND CHEMO INJECTION 100 MG
100.0000 mg/m2 | Freq: Once | INTRAVENOUS | Status: AC
  Administered 2022-01-17: 200 mg via INTRAVENOUS
  Filled 2022-01-17: qty 40

## 2022-01-17 MED ORDER — PROCHLORPERAZINE MALEATE 10 MG PO TABS
10.0000 mg | ORAL_TABLET | Freq: Once | ORAL | Status: AC
  Administered 2022-01-17: 10 mg via ORAL

## 2022-01-17 MED ORDER — HEPARIN SOD (PORK) LOCK FLUSH 100 UNIT/ML IV SOLN
500.0000 [IU] | Freq: Once | INTRAVENOUS | Status: AC | PRN
  Administered 2022-01-17: 500 [IU]

## 2022-01-17 MED ORDER — POTASSIUM CHLORIDE 10 MEQ/100ML IV SOLN
10.0000 meq | INTRAVENOUS | Status: AC
  Administered 2022-01-17 (×2): 10 meq via INTRAVENOUS
  Filled 2022-01-17: qty 100

## 2022-01-17 MED ORDER — SODIUM CHLORIDE 0.9% FLUSH
10.0000 mL | INTRAVENOUS | Status: DC | PRN
  Administered 2022-01-17: 10 mL

## 2022-01-17 MED ORDER — SODIUM CHLORIDE 0.9 % IV SOLN: Freq: Once | INTRAVENOUS | Status: AC

## 2022-01-17 NOTE — Patient Instructions (Signed)
Alyssa Ross  Discharge Instructions: Thank you for choosing [facility] to provide your oncology and hematology care.   If you have a lab appointment with the [facility], please go directly to the [facility] and check in at the registration area.   Wear comfortable clothing and clothing appropriate for easy access to any Portacath or PICC line.   We strive to give you quality time with your provider. You may need to reschedule your appointment if you arrive late (15 or more minutes).  Arriving late affects you and other patients whose appointments are after yours.  Also, if you miss three or more appointments without notifying the office, you may be dismissed from the clinic at the provider's discretion.      For prescription refill requests, have your pharmacy contact our office and allow 72 hours for refills to be completed.    Today you received the following chemotherapy and/or immunotherapy agents Gemzar      To help prevent nausea and vomiting after your treatment, we encourage you to take your nausea medication as directed.  BELOW ARE SYMPTOMS THAT SHOULD BE REPORTED IMMEDIATELY: *FEVER GREATER THAN 100.4 F (38 C) OR HIGHER *CHILLS OR SWEATING *NAUSEA AND VOMITING THAT IS NOT CONTROLLED WITH YOUR NAUSEA MEDICATION *UNUSUAL SHORTNESS OF BREATH *UNUSUAL BRUISING OR BLEEDING *URINARY PROBLEMS (pain or burning when urinating, or frequent urination) *BOWEL PROBLEMS (unusual diarrhea, constipation, pain near the anus) TENDERNESS IN MOUTH AND THROAT WITH OR WITHOUT PRESENCE OF ULCERS (sore throat, sores in mouth, or a toothache) UNUSUAL RASH, SWELLING OR PAIN  UNUSUAL VAGINAL DISCHARGE OR ITCHING   Items with * indicate a potential emergency and should be followed up as soon as possible or go to the Emergency Department if any problems should occur.  Please show the CHEMOTHERAPY ALERT CARD or IMMUNOTHERAPY ALERT CARD at check-in to the  Emergency Department and triage nurse.  Should you have questions after your visit or need to cancel or reschedule your appointment, please contact [facility]  Dept: [phone]  and follow the prompts.  Office hours are 8:00 a.m. to 4:30 p.m. Monday - Friday. Please note that voicemails left after 4:00 p.m. may not be returned until the following business day.  We are closed weekends and major holidays. You have access to a nurse at all times for urgent questions. Please call the main number to the clinic Dept: [phone] and follow the prompts.   For any non-urgent questions, you may also contact your provider using MyChart. We now offer e-Visits for anyone 16 and older to request care online for non-urgent symptoms. For details visit [email or web address].   Also download the MyChart app! Go to the app store, search "MyChart", open the app, select [facility], and log in with your MyChart username and password.  Masks are optional in the cancer centers. If you would like for your care team to wear a mask while they are taking care of you, please let them know. For doctor visits, patients may have with them one support person who is at least 56 years old. At this time, visitors are not allowed in the infusion area. Gemcitabine injection What is this medication? GEMCITABINE (jem SYE ta been) is a chemotherapy drug. This medicine is used to treat many types of cancer like breast cancer, lung cancer, pancreatic cancer, and ovarian cancer. This medicine may be used for other purposes; ask your health care provider or pharmacist if  you have questions. COMMON BRAND NAME(S): Gemzar, Infugem What should I tell my care team before I take this medication? They need to know if you have any of these conditions: blood disorders infection kidney disease liver disease lung or breathing disease, like asthma recent or ongoing radiation therapy an unusual or allergic reaction  to gemcitabine, other chemotherapy, other medicines, foods, dyes, or preservatives pregnant or trying to get pregnant breast-feeding How should I use this medication? This drug is given as an infusion into a vein. It is administered in a hospital or clinic by a specially trained health care professional. Talk to your pediatrician regarding the use of this medicine in children. Special care may be needed. Overdosage: If you think you have taken too much of this medicine contact a poison control center or emergency room at once. NOTE: This medicine is only for you. Do not share this medicine with others. What if I miss a dose? It is important not to miss your dose. Call your doctor or health care professional if you are unable to keep an appointment. What may interact with this medication? medicines to increase blood counts like filgrastim, pegfilgrastim, sargramostim some other chemotherapy drugs like cisplatin vaccines Talk to your doctor or health care professional before taking any of these medicines: acetaminophen aspirin ibuprofen ketoprofen naproxen This list may not describe all possible interactions. Give your health care provider a list of all the medicines, herbs, non-prescription drugs, or dietary supplements you use. Also tell them if you smoke, drink alcohol, or use illegal drugs. Some items may interact with your medicine. What should I watch for while using this medication? Visit your doctor for checks on your progress. This drug may make you feel generally unwell. This is not uncommon, as chemotherapy can affect healthy cells as well as cancer cells. Report any side effects. Continue your course of treatment even though you feel ill unless your doctor tells you to stop. In some cases, you may be given additional medicines to help with side effects. Follow all directions for their use. Call your doctor or health care professional for advice if you get a fever, chills or sore  throat, or other symptoms of a cold or flu. Do not treat yourself. This drug decreases your body's ability to fight infections. Try to avoid being around people who are sick. This medicine may increase your risk to bruise or bleed. Call your doctor or health care professional if you notice any unusual bleeding. Be careful brushing and flossing your teeth or using a toothpick because you may get an infection or bleed more easily. If you have any dental work done, tell your dentist you are receiving this medicine. Avoid taking products that contain aspirin, acetaminophen, ibuprofen, naproxen, or ketoprofen unless instructed by your doctor. These medicines may hide a fever. Do not become pregnant while taking this medicine or for 6 months after stopping it. Women should inform their doctor if they wish to become pregnant or think they might be pregnant. Men should not father a child while taking this medicine and for 3 months after stopping it. There is a potential for serious side effects to an unborn child. Talk to your health care professional or pharmacist for more information. Do not breast-feed an infant while taking this medicine or for at least 1 week after stopping it. Men should inform their doctors if they wish to father a child. This medicine may lower sperm counts. Talk with your doctor or health care professional if  you are concerned about your fertility. What side effects may I notice from receiving this medication? Side effects that you should report to your doctor or health care professional as soon as possible: allergic reactions like skin rash, itching or hives, swelling of the face, lips, or tongue breathing problems pain, redness, or irritation at site where injected signs and symptoms of a dangerous change in heartbeat or heart rhythm like chest pain; dizziness; fast or irregular heartbeat; palpitations; feeling faint or lightheaded, falls; breathing problems signs of decreased  platelets or bleeding - bruising, pinpoint red spots on the skin, black, tarry stools, blood in the urine signs of decreased red blood cells - unusually weak or tired, feeling faint or lightheaded, falls signs of infection - fever or chills, cough, sore throat, pain or difficulty passing urine signs and symptoms of kidney injury like trouble passing urine or change in the amount of urine signs and symptoms of liver injury like dark yellow or brown urine; general ill feeling or flu-like symptoms; light-colored stools; loss of appetite; nausea; right upper belly pain; unusually weak or tired; yellowing of the eyes or skin swelling of ankles, feet, hands Side effects that usually do not require medical attention (report to your doctor or health care professional if they continue or are bothersome): constipation diarrhea hair loss loss of appetite nausea rash vomiting This list may not describe all possible side effects. Call your doctor for medical advice about side effects. You may report side effects to FDA at 1-800-FDA-1088. Where should I keep my medication? This drug is given in a hospital or clinic and will not be stored at home. NOTE: This sheet is a summary. It may not cover all possible information. If you have questions about this medicine, talk to your doctor, pharmacist, or health care provider.  2023 Elsevier/Gold Standard (2017-08-20 00:00:00) Nanoparticle Albumin-Bound Paclitaxel injection What is this medication? NANOPARTICLE ALBUMIN-BOUND PACLITAXEL (Na no PAHR ti kuhl  al BYOO muhn-bound  PAK li TAX el) is a chemotherapy drug. It targets fast dividing cells, like cancer cells, and causes these cells to die. This medicine is used to treat advanced breast cancer, lung cancer, and pancreatic cancer. This medicine may be used for other purposes; ask your health care provider or pharmacist if you have questions. COMMON BRAND NAME(S): Abraxane What should I tell my care team before  I take this medication? They need to know if you have any of these conditions: kidney disease liver disease low blood counts, like low white cell, platelet, or red cell counts lung or breathing disease, like asthma tingling of the fingers or toes, or other nerve disorder an unusual or allergic reaction to paclitaxel, albumin, other chemotherapy, other medicines, foods, dyes, or preservatives pregnant or trying to get pregnant breast-feeding How should I use this medication? This drug is given as an infusion into a vein. It is administered in a hospital or clinic by a specially trained health care professional. Talk to your pediatrician regarding the use of this medicine in children. Special care may be needed. Overdosage: If you think you have taken too much of this medicine contact a poison control center or emergency room at once. NOTE: This medicine is only for you. Do not share this medicine with others. What if I miss a dose? It is important not to miss your dose. Call your doctor or health care professional if you are unable to keep an appointment. What may interact with this medication? This medicine may interact with the  following medications: antiviral medicines for hepatitis, HIV or AIDS certain antibiotics like erythromycin and clarithromycin certain medicines for fungal infections like ketoconazole and itraconazole certain medicines for seizures like carbamazepine, phenobarbital, phenytoin gemfibrozil nefazodone rifampin St. John's wort This list may not describe all possible interactions. Give your health care provider a list of all the medicines, herbs, non-prescription drugs, or dietary supplements you use. Also tell them if you smoke, drink alcohol, or use illegal drugs. Some items may interact with your medicine. What should I watch for while using this medication? Your condition will be monitored carefully while you are receiving this medicine. You will need important  blood work done while you are taking this medicine. This medicine can cause serious allergic reactions. If you experience allergic reactions like skin rash, itching or hives, swelling of the face, lips, or tongue, tell your doctor or health care professional right away. In some cases, you may be given additional medicines to help with side effects. Follow all directions for their use. This drug may make you feel generally unwell. This is not uncommon, as chemotherapy can affect healthy cells as well as cancer cells. Report any side effects. Continue your course of treatment even though you feel ill unless your doctor tells you to stop. Call your doctor or health care professional for advice if you get a fever, chills or sore throat, or other symptoms of a cold or flu. Do not treat yourself. This drug decreases your body's ability to fight infections. Try to avoid being around people who are sick. This medicine may increase your risk to bruise or bleed. Call your doctor or health care professional if you notice any unusual bleeding. Be careful brushing and flossing your teeth or using a toothpick because you may get an infection or bleed more easily. If you have any dental work done, tell your dentist you are receiving this medicine. Avoid taking products that contain aspirin, acetaminophen, ibuprofen, naproxen, or ketoprofen unless instructed by your doctor. These medicines may hide a fever. Do not become pregnant while taking this medicine or for 6 months after stopping it. Women should inform their doctor if they wish to become pregnant or think they might be pregnant. Men should not father a child while taking this medicine or for 3 months after stopping it. There is a potential for serious side effects to an unborn child. Talk to your health care professional or pharmacist for more information. Do not breast-feed an infant while taking this medicine or for 2 weeks after stopping it. This medicine may  interfere with the ability to get pregnant or to father a child. You should talk to your doctor or health care professional if you are concerned about your fertility. What side effects may I notice from receiving this medication? Side effects that you should report to your doctor or health care professional as soon as possible: allergic reactions like skin rash, itching or hives, swelling of the face, lips, or tongue breathing problems changes in vision fast, irregular heartbeat low blood pressure mouth sores pain, tingling, numbness in the hands or feet signs of decreased platelets or bleeding - bruising, pinpoint red spots on the skin, black, tarry stools, blood in the urine signs of decreased red blood cells - unusually weak or tired, feeling faint or lightheaded, falls signs of infection - fever or chills, cough, sore throat, pain or difficulty passing urine signs and symptoms of liver injury like dark yellow or brown urine; general ill feeling or flu-like symptoms;  light-colored stools; loss of appetite; nausea; right upper belly pain; unusually weak or tired; yellowing of the eyes or skin swelling of the ankles, feet, hands unusually slow heartbeat Side effects that usually do not require medical attention (report to your doctor or health care professional if they continue or are bothersome): diarrhea hair loss loss of appetite nausea, vomiting tiredness This list may not describe all possible side effects. Call your doctor for medical advice about side effects. You may report side effects to FDA at 1-800-FDA-1088. Where should I keep my medication? This drug is given in a hospital or clinic and will not be stored at home. NOTE: This sheet is a summary. It may not cover all possible information. If you have questions about this medicine, talk to your doctor, pharmacist, or health care provider.  2023 Elsevier/Gold Standard (2017-01-28 00:00:00)

## 2022-01-17 NOTE — Progress Notes (Signed)
[facility] OFFICE PROGRESS NOTE   Diagnosis: Pancreas cancer  INTERVAL HISTORY:   Alyssa Ross completed another cycle of gemcitabine/Abraxane on 01/03/2022.  She reports a mild increase in neuropathy symptoms.  No other complaint.  Objective:  Vital signs in last 24 hours:  Blood pressure 122/88, pulse 79, temperature 98.2 F (36.8 C), temperature source Oral, resp. rate 20, height 5' 7" (1.702 m), weight 190 lb (86.2 kg), last menstrual period 09/17/2012, SpO2 100 %.    HEENT: No thrush or ulcers Resp: Lungs clear bilaterally Cardio: Regular rate and rhythm GI: No hepatosplenomegaly, nontender Vascular: No leg edema  Skin: Hyperpigmented pustular lesions over the face and trunk  Portacath/PICC-without erythema  Lab Results:  Lab Results  Component Value Date   WBC 4.2 01/17/2022   HGB 9.0 (L) 01/17/2022   HCT 28.4 (L) 01/17/2022   MCV 91.0 01/17/2022   PLT 348 01/17/2022   NEUTROABS 2.4 01/17/2022    CMP  Lab Results  Component Value Date   NA 144 01/17/2022   K 3.2 (L) 01/17/2022   CL 114 (H) 01/17/2022   CO2 22 01/17/2022   GLUCOSE 97 01/17/2022   BUN 7 01/17/2022   CREATININE 1.07 (H) 01/17/2022   CALCIUM 8.4 (L) 01/17/2022   PROT 6.7 01/17/2022   ALBUMIN 3.2 (L) 01/17/2022   AST 29 01/17/2022   ALT 33 01/17/2022   ALKPHOS 261 (H) 01/17/2022   BILITOT 0.4 01/17/2022   GFRNONAA >60 01/17/2022   GFRAA >60 02/07/2020    Lab Results  Component Value Date   KPT465 53 (H) 01/03/2022     Medications: I have reviewed the patient's current medications.   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 Tumor invades the duodenal wall and vascular groove, resection margins negative, 4/34 lymph nodes positive MSI-stable, tumor showed instability in 2 loci as did adjacent normal tissue Foundation 1-K-ras G12 V, microsatellite status and tumor mutation burden cannot be determined EUS FNA  biopsy of pancreas mass on 07/03/2018-well-differentiated neuroendocrine tumor CTs 01/29/2019-ill-defined pancreas head mass, 5 pulmonary nodules-1 with a small amount of central cavitation, tumor abuts the left margin of the portal vein indistinct density surrounding, hepatic artery, complex cystic lesion of the right kidney, right adrenal mass-characterized as an adenoma on a [facility] MRI 12/21/2018 [facility] 03/03/2019-no focal pancreas activity, no tracer accumulation within the suspicious pulmonary nodules, left uterine mass with tracer accumulation felt to represent a leiomyoma Elevated preoperative CA 19-9--CA 19-9 186 on 01/18/2019 CT chest 04/16/2019-multiple bilateral pulmonary nodules, some with increased cavitation, stable in size Cycle 1 FOLFIRINOX 04/27/2019 Cycle 2 FOLFIRINOX 05/11/2019 Cycle 3 FOLFIRINOX 05/23/2019 Cycle 4 FOLFIRINOX 06/08/2019 Cycle 5 FOLFIRINOX 06/22/2019 CT chest 07/02/2019-stable size of bilateral pulmonary nodules.  Dominant cavitary lesions in both lungs show increased cavitation with thinner walls.  Stable 2.1 cm right adrenal nodule. 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 No definite evidence of metastatic disease in the abdomen or pelvis. Markedly enlarged and heterogeneous appearing uterus, likely to represent multifocal fibroids. 1 of these lesions appears to be an exophytic subserosal fibroid in the posterior lateral aspect of the uterine body on the left side although this  comes in close proximity to the left adnexa such that a primary ovarian lesion is  difficult to completely exclude. CTs 02/07/2020-slight enlargement of bilateral lung nodules, some are cavitary, no evidence of metastatic disease in the abdomen or pelvis, stable right adrenal nodule, uterine fibroids CTs 04/26/2020-mild enlargement of pulmonary nodules, slight increase in trace pelvic fluid, new soft tissue thickening inferior to the cecal tip suspicious for peritoneal metastasis CT 05/26/2020-improved appearance of soft tissue at the inferior tip of the cecum, mildly thickened short appendix-findings suggestive of resolving appendicitis, stable small bibasilar pulmonary nodules, fibroids Plan biopsy of right cecal tip soft tissue canceled secondary to radiologic improvement CT chest 08/02/2020-enlargement and progressive cavitation of multiple bilateral lung nodules.  Some new nodules are present. CTs 10/24/2020- increase in size of pulmonary nodules, no new nodules, no evidence of metastatic disease in the abdomen, stable right adrenal nodule CT 01/09/2021-slight interval enlargement of pulmonary nodules, stable right adrenal nodule Navigation bronchoscopy 01/30/2021-left lower lobe cavitary nodule FNA-adenocarcinoma, brushing-adenocarcinoma.  Left lower lobe lavage-adenocarcinoma.  Right upper lobe brushing and FNA biopsy of cavitary nodule-adenocarcinoma-immunohistochemical profile consistent with pancreas adenocarcinoma Cycle 1 gemcitabine/Abraxane 03/28/2021 Cycle 2 gemcitabine/Abraxane 04/11/2021 Cycle 3 gemcitabine/Abraxane 04/25/2021 Cycle 4 gemcitabine/Abraxane 05/09/2021 Cycle 5 gemcitabine/Abraxane 05/23/2021 CT chest 06/05/2021-interval cavitation of multiple pulmonary nodules, some nodules have decreased in size, no new or enlarging nodules Cycle 6 gemcitabine/Abraxane 06/06/2021 Cycle 7 gemcitabine/Abraxane 06/21/2021 Cycle 8 gemcitabine/Abraxane 07/05/2021 Cycle 9 Gemcitabine/Abraxane 07/19/2021 Cycle 10 gemcitabine 08/01/2021-Abraxane held secondary to neuropathy CT chest  08/13/2021-mild decrease in size and wall thickness of multiple cavitary nodules, no new or progressive disease in the chest, indeterminate low-attenuation right liver lesions Cycle 11 gemcitabine 08/16/2021-Abraxane held secondary to neuropathy Cycle 12 gemcitabine 08/30/2021-Abraxane held secondary to neuropathy Cycle 13 gemcitabine 09/13/2021-Abraxane held secondary to neuropathy Cycle 14 gemcitabine 09/27/2021-Abraxane held secondary to neuropathy Cycle 15 gemcitabine 10/11/2021-Abraxane held secondary to neuropathy CTs 10/22/2021-no change in multiple cavitary lung nodules, no evidence of disease progression, ill-defined hypodense lesion in the posterior right liver suspicious for metastatic disease Cycle 16 gemcitabine 10/25/2021 Cycle 17 gemcitabine 11/08/2021 Cycle 18 Gemcitabine 11/22/2021 Cycle 19 gemcitabine 12/06/2021 Cycle 20 Gemcitabine 12/20/2021 CT 12/31/2021-mild increase in size of bilateral pulmonary metastases, stable subtle continuation right liver lesions Cycle 20 gemcitabine/Abraxane 01/03/2022 Cycle 21 gemcitabine/Abraxane 01/17/2022   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  Abraxane was resumed with the last cycle of chemotherapy.  She reports a mild increase in neuropathy symptoms.  She is agreeable to proceed with another cycle of gemcitabine/Abraxane today.  We will follow-up on the CA 19-9 from today.  I reviewed results of Foundation 1 testing with her.  There is no actionable mutation.  She will return for an  office visit and chemotherapy in 2 weeks.  [name], MD  01/17/2022  9:29 AM

## 2022-01-17 NOTE — Patient Instructions (Signed)

## 2022-01-17 NOTE — Progress Notes (Signed)
Patient seen by Dr. Benay Spice today  Vitals are within treatment parameters.  Labs reviewed by Dr. Benay Spice and are not all within treatment parameters. K+ 3.2 and Mg+ 1.6 --proceed w/treatment.  Per physician team, patient is ready for treatment. Please note that modifications are being made to the treatment plan including Will receive 20 meq KCL and 2 grams Mg+ today.

## 2022-01-19 LAB — CANCER ANTIGEN 19-9: CA 19-9: 64 U/mL — ABNORMAL HIGH (ref 0–35)

## 2022-01-26 ENCOUNTER — Other Ambulatory Visit: Payer: Self-pay | Admitting: Oncology

## 2022-01-27 LAB — SURGICAL PATHOLOGY

## 2022-01-31 ENCOUNTER — Inpatient Hospital Stay: Payer: BC Managed Care – PPO

## 2022-01-31 ENCOUNTER — Encounter: Payer: Self-pay | Admitting: *Deleted

## 2022-01-31 ENCOUNTER — Inpatient Hospital Stay (HOSPITAL_BASED_OUTPATIENT_CLINIC_OR_DEPARTMENT_OTHER): Payer: BC Managed Care – PPO | Admitting: Oncology

## 2022-01-31 ENCOUNTER — Other Ambulatory Visit: Payer: Self-pay | Admitting: *Deleted

## 2022-01-31 VITALS — BP 134/80 | HR 94 | Temp 98.2°F | Resp 18 | Ht 67.0 in | Wt 189.0 lb

## 2022-01-31 VITALS — BP 133/87 | HR 55

## 2022-01-31 DIAGNOSIS — I10 Essential (primary) hypertension: Secondary | ICD-10-CM | POA: Diagnosis not present

## 2022-01-31 DIAGNOSIS — E1140 Type 2 diabetes mellitus with diabetic neuropathy, unspecified: Secondary | ICD-10-CM | POA: Diagnosis not present

## 2022-01-31 DIAGNOSIS — D649 Anemia, unspecified: Secondary | ICD-10-CM

## 2022-01-31 DIAGNOSIS — C250 Malignant neoplasm of head of pancreas: Secondary | ICD-10-CM | POA: Diagnosis not present

## 2022-01-31 DIAGNOSIS — Z79899 Other long term (current) drug therapy: Secondary | ICD-10-CM | POA: Diagnosis not present

## 2022-01-31 DIAGNOSIS — C7802 Secondary malignant neoplasm of left lung: Secondary | ICD-10-CM | POA: Diagnosis not present

## 2022-01-31 DIAGNOSIS — C7801 Secondary malignant neoplasm of right lung: Secondary | ICD-10-CM | POA: Diagnosis not present

## 2022-01-31 DIAGNOSIS — N6320 Unspecified lump in the left breast, unspecified quadrant: Secondary | ICD-10-CM | POA: Diagnosis not present

## 2022-01-31 DIAGNOSIS — Z5111 Encounter for antineoplastic chemotherapy: Secondary | ICD-10-CM | POA: Diagnosis not present

## 2022-01-31 LAB — CBC WITH DIFFERENTIAL (CANCER CENTER ONLY)
Abs Immature Granulocytes: 0.01 10*3/uL (ref 0.00–0.07)
Basophils Absolute: 0 10*3/uL (ref 0.0–0.1)
Basophils Relative: 0 %
Eosinophils Absolute: 0.1 10*3/uL (ref 0.0–0.5)
Eosinophils Relative: 2 %
HCT: 25.4 % — ABNORMAL LOW (ref 36.0–46.0)
Hemoglobin: 8.1 g/dL — ABNORMAL LOW (ref 12.0–15.0)
Immature Granulocytes: 0 %
Lymphocytes Relative: 18 %
Lymphs Abs: 0.9 10*3/uL (ref 0.7–4.0)
MCH: 28.9 pg (ref 26.0–34.0)
MCHC: 31.9 g/dL (ref 30.0–36.0)
MCV: 90.7 fL (ref 80.0–100.0)
Monocytes Absolute: 0.6 10*3/uL (ref 0.1–1.0)
Monocytes Relative: 13 %
Neutro Abs: 3.3 10*3/uL (ref 1.7–7.7)
Neutrophils Relative %: 67 %
Platelet Count: 456 10*3/uL — ABNORMAL HIGH (ref 150–400)
RBC: 2.8 MIL/uL — ABNORMAL LOW (ref 3.87–5.11)
RDW: 15.9 % — ABNORMAL HIGH (ref 11.5–15.5)
WBC Count: 4.9 10*3/uL (ref 4.0–10.5)
nRBC: 0 % (ref 0.0–0.2)

## 2022-01-31 LAB — PREPARE RBC (CROSSMATCH)

## 2022-01-31 LAB — CMP (CANCER CENTER ONLY)
ALT: 19 U/L (ref 0–44)
AST: 20 U/L (ref 15–41)
Albumin: 3.3 g/dL — ABNORMAL LOW (ref 3.5–5.0)
Alkaline Phosphatase: 225 U/L — ABNORMAL HIGH (ref 38–126)
Anion gap: 8 (ref 5–15)
BUN: 15 mg/dL (ref 6–20)
CO2: 21 mmol/L — ABNORMAL LOW (ref 22–32)
Calcium: 8.7 mg/dL — ABNORMAL LOW (ref 8.9–10.3)
Chloride: 104 mmol/L (ref 98–111)
Creatinine: 1.04 mg/dL — ABNORMAL HIGH (ref 0.44–1.00)
GFR, Estimated: >60 mL/min (ref >60)
Glucose, Bld: 373 mg/dL — ABNORMAL HIGH (ref 70–99)
Potassium: 3.3 mmol/L — ABNORMAL LOW (ref 3.5–5.1)
Sodium: 133 mmol/L — ABNORMAL LOW (ref 135–145)
Total Bilirubin: 0.4 mg/dL (ref 0.3–1.2)
Total Protein: 6.9 g/dL (ref 6.5–8.1)

## 2022-01-31 LAB — MAGNESIUM: Magnesium: 1.7 mg/dL (ref 1.7–2.4)

## 2022-01-31 MED ORDER — HEPARIN SOD (PORK) LOCK FLUSH 100 UNIT/ML IV SOLN
500.0000 [IU] | Freq: Once | INTRAVENOUS | Status: AC | PRN
  Administered 2022-01-31: 500 [IU]

## 2022-01-31 MED ORDER — SODIUM CHLORIDE 0.9% FLUSH
10.0000 mL | INTRAVENOUS | Status: DC | PRN
  Administered 2022-01-31: 10 mL

## 2022-01-31 MED ORDER — SODIUM CHLORIDE 0.9 % IV SOLN: Freq: Once | INTRAVENOUS | Status: AC

## 2022-01-31 MED ORDER — SODIUM CHLORIDE 0.9 % IV SOLN
2000.0000 mg | Freq: Once | INTRAVENOUS | Status: AC
  Administered 2022-01-31: 2000 mg via INTRAVENOUS
  Filled 2022-01-31: qty 52.6

## 2022-01-31 MED ORDER — PROCHLORPERAZINE MALEATE 10 MG PO TABS
10.0000 mg | ORAL_TABLET | Freq: Once | ORAL | Status: AC
  Administered 2022-01-31: 10 mg via ORAL
  Filled 2022-01-31: qty 1

## 2022-01-31 MED ORDER — PACLITAXEL PROTEIN-BOUND CHEMO INJECTION 100 MG
100.0000 mg/m2 | Freq: Once | INTRAVENOUS | Status: AC
  Administered 2022-01-31: 200 mg via INTRAVENOUS
  Filled 2022-01-31: qty 40

## 2022-01-31 NOTE — Patient Instructions (Addendum)
[facility]  Discharge Instructions: Thank you for choosing [facility] to provide your oncology and hematology care.   If you have a lab appointment with the [facility], please go directly to the [facility] and check in at the registration area.   Wear comfortable clothing and clothing appropriate for easy access to any Portacath or PICC line.   We strive to give you quality time with your provider. You may need to reschedule your appointment if you arrive late (15 or more minutes).  Arriving late affects you and other patients whose appointments are after yours.  Also, if you miss three or more appointments without notifying the office, you may be dismissed from the clinic at the provider's discretion.      For prescription refill requests, have your pharmacy contact our office and allow 72 hours for refills to be completed.    Today you received the following chemotherapy and/or immunotherapy agents Gemzar, abraxane      To help prevent nausea and vomiting after your treatment, we encourage you to take your nausea medication as directed.  BELOW ARE SYMPTOMS THAT SHOULD BE REPORTED IMMEDIATELY: *FEVER GREATER THAN 100.4 F (38 C) OR HIGHER *CHILLS OR SWEATING *NAUSEA AND VOMITING THAT IS NOT CONTROLLED WITH YOUR NAUSEA MEDICATION *UNUSUAL SHORTNESS OF BREATH *UNUSUAL BRUISING OR BLEEDING *URINARY PROBLEMS (pain or burning when urinating, or frequent urination) *BOWEL PROBLEMS (unusual diarrhea, constipation, pain near the anus) TENDERNESS IN MOUTH AND THROAT WITH OR WITHOUT PRESENCE OF ULCERS (sore throat, sores in mouth, or a toothache) UNUSUAL RASH, SWELLING OR PAIN  UNUSUAL VAGINAL DISCHARGE OR ITCHING   Items with * indicate a potential emergency and should be followed up as soon as possible or go to the Emergency Department if any problems should occur.  Please show the CHEMOTHERAPY ALERT CARD or IMMUNOTHERAPY ALERT CARD at check-in  to the Emergency Department and triage nurse.  Should you have questions after your visit or need to cancel or reschedule your appointment, please contact [facility]  Dept: [phone]  and follow the prompts.  Office hours are 8:00 a.m. to 4:30 p.m. Monday - Friday. Please note that voicemails left after 4:00 p.m. may not be returned until the following business day.  We are closed weekends and major holidays. You have access to a nurse at all times for urgent questions. Please call the main number to the clinic Dept: [phone] and follow the prompts.   For any non-urgent questions, you may also contact your provider using MyChart. We now offer e-Visits for anyone 15 and older to request care online for non-urgent symptoms. For details visit [email or web address].   Also download the MyChart app! Go to the app store, search "MyChart", open the app, select [facility], and log in with your MyChart username and password.  Masks are optional in the cancer centers. If you would like for your care team to wear a mask while they are taking care of you, please let them know. For doctor visits, patients may have with them one support person who is at least 56 years old. At this time, visitors are not allowed in the infusion area. Gemcitabine injection What is this medication? GEMCITABINE (jem SYE ta been) is a chemotherapy drug. This medicine is used to treat many types of cancer like breast cancer, lung cancer, pancreatic cancer, and ovarian cancer. This medicine may be used for other purposes; ask your health care provider or pharmacist  if you have questions. COMMON BRAND NAME(S): Gemzar, Infugem What should I tell my care team before I take this medication? They need to know if you have any of these conditions: blood disorders infection kidney disease liver disease lung or breathing disease, like asthma recent or ongoing radiation therapy an unusual or allergic  reaction to gemcitabine, other chemotherapy, other medicines, foods, dyes, or preservatives pregnant or trying to get pregnant breast-feeding How should I use this medication? This drug is given as an infusion into a vein. It is administered in a hospital or clinic by a specially trained health care professional. Talk to your pediatrician regarding the use of this medicine in children. Special care may be needed. Overdosage: If you think you have taken too much of this medicine contact a poison control center or emergency room at once. NOTE: This medicine is only for you. Do not share this medicine with others. What if I miss a dose? It is important not to miss your dose. Call your doctor or health care professional if you are unable to keep an appointment. What may interact with this medication? medicines to increase blood counts like filgrastim, pegfilgrastim, sargramostim some other chemotherapy drugs like cisplatin vaccines Talk to your doctor or health care professional before taking any of these medicines: acetaminophen aspirin ibuprofen ketoprofen naproxen This list may not describe all possible interactions. Give your health care provider a list of all the medicines, herbs, non-prescription drugs, or dietary supplements you use. Also tell them if you smoke, drink alcohol, or use illegal drugs. Some items may interact with your medicine. What should I watch for while using this medication? Visit your doctor for checks on your progress. This drug may make you feel generally unwell. This is not uncommon, as chemotherapy can affect healthy cells as well as cancer cells. Report any side effects. Continue your course of treatment even though you feel ill unless your doctor tells you to stop. In some cases, you may be given additional medicines to help with side effects. Follow all directions for their use. Call your doctor or health care professional for advice if you get a fever, chills or  sore throat, or other symptoms of a cold or flu. Do not treat yourself. This drug decreases your body's ability to fight infections. Try to avoid being around people who are sick. This medicine may increase your risk to bruise or bleed. Call your doctor or health care professional if you notice any unusual bleeding. Be careful brushing and flossing your teeth or using a toothpick because you may get an infection or bleed more easily. If you have any dental work done, tell your dentist you are receiving this medicine. Avoid taking products that contain aspirin, acetaminophen, ibuprofen, naproxen, or ketoprofen unless instructed by your doctor. These medicines may hide a fever. Do not become pregnant while taking this medicine or for 6 months after stopping it. Women should inform their doctor if they wish to become pregnant or think they might be pregnant. Men should not father a child while taking this medicine and for 3 months after stopping it. There is a potential for serious side effects to an unborn child. Talk to your health care professional or pharmacist for more information. Do not breast-feed an infant while taking this medicine or for at least 1 week after stopping it. Men should inform their doctors if they wish to father a child. This medicine may lower sperm counts. Talk with your doctor or health care professional  if you are concerned about your fertility. What side effects may I notice from receiving this medication? Side effects that you should report to your doctor or health care professional as soon as possible: allergic reactions like skin rash, itching or hives, swelling of the face, lips, or tongue breathing problems pain, redness, or irritation at site where injected signs and symptoms of a dangerous change in heartbeat or heart rhythm like chest pain; dizziness; fast or irregular heartbeat; palpitations; feeling faint or lightheaded, falls; breathing problems signs of decreased  platelets or bleeding - bruising, pinpoint red spots on the skin, black, tarry stools, blood in the urine signs of decreased red blood cells - unusually weak or tired, feeling faint or lightheaded, falls signs of infection - fever or chills, cough, sore throat, pain or difficulty passing urine signs and symptoms of kidney injury like trouble passing urine or change in the amount of urine signs and symptoms of liver injury like dark yellow or brown urine; general ill feeling or flu-like symptoms; light-colored stools; loss of appetite; nausea; right upper belly pain; unusually weak or tired; yellowing of the eyes or skin swelling of ankles, feet, hands Side effects that usually do not require medical attention (report to your doctor or health care professional if they continue or are bothersome): constipation diarrhea hair loss loss of appetite nausea rash vomiting This list may not describe all possible side effects. Call your doctor for medical advice about side effects. You may report side effects to FDA at 1-800-FDA-1088. Where should I keep my medication? This drug is given in a hospital or clinic and will not be stored at home. NOTE: This sheet is a summary. It may not cover all possible information. If you have questions about this medicine, talk to your doctor, pharmacist, or health care provider.  2023 Elsevier/Gold Standard (2017-08-20 00:00:00) Nanoparticle Albumin-Bound Paclitaxel injection What is this medication? NANOPARTICLE ALBUMIN-BOUND PACLITAXEL (Na no PAHR ti kuhl  al BYOO muhn-bound  PAK li TAX el) is a chemotherapy drug. It targets fast dividing cells, like cancer cells, and causes these cells to die. This medicine is used to treat advanced breast cancer, lung cancer, and pancreatic cancer. This medicine may be used for other purposes; ask your health care provider or pharmacist if you have questions. COMMON BRAND NAME(S): Abraxane What should I tell my care team before  I take this medication? They need to know if you have any of these conditions: kidney disease liver disease low blood counts, like low white cell, platelet, or red cell counts lung or breathing disease, like asthma tingling of the fingers or toes, or other nerve disorder an unusual or allergic reaction to paclitaxel, albumin, other chemotherapy, other medicines, foods, dyes, or preservatives pregnant or trying to get pregnant breast-feeding How should I use this medication? This drug is given as an infusion into a vein. It is administered in a hospital or clinic by a specially trained health care professional. Talk to your pediatrician regarding the use of this medicine in children. Special care may be needed. Overdosage: If you think you have taken too much of this medicine contact a poison control center or emergency room at once. NOTE: This medicine is only for you. Do not share this medicine with others. What if I miss a dose? It is important not to miss your dose. Call your doctor or health care professional if you are unable to keep an appointment. What may interact with this medication? This medicine may interact with  the following medications: antiviral medicines for hepatitis, HIV or AIDS certain antibiotics like erythromycin and clarithromycin certain medicines for fungal infections like ketoconazole and itraconazole certain medicines for seizures like carbamazepine, phenobarbital, phenytoin gemfibrozil nefazodone rifampin St. John's wort This list may not describe all possible interactions. Give your health care provider a list of all the medicines, herbs, non-prescription drugs, or dietary supplements you use. Also tell them if you smoke, drink alcohol, or use illegal drugs. Some items may interact with your medicine. What should I watch for while using this medication? Your condition will be monitored carefully while you are receiving this medicine. You will need important  blood work done while you are taking this medicine. This medicine can cause serious allergic reactions. If you experience allergic reactions like skin rash, itching or hives, swelling of the face, lips, or tongue, tell your doctor or health care professional right away. In some cases, you may be given additional medicines to help with side effects. Follow all directions for their use. This drug may make you feel generally unwell. This is not uncommon, as chemotherapy can affect healthy cells as well as cancer cells. Report any side effects. Continue your course of treatment even though you feel ill unless your doctor tells you to stop. Call your doctor or health care professional for advice if you get a fever, chills or sore throat, or other symptoms of a cold or flu. Do not treat yourself. This drug decreases your body's ability to fight infections. Try to avoid being around people who are sick. This medicine may increase your risk to bruise or bleed. Call your doctor or health care professional if you notice any unusual bleeding. Be careful brushing and flossing your teeth or using a toothpick because you may get an infection or bleed more easily. If you have any dental work done, tell your dentist you are receiving this medicine. Avoid taking products that contain aspirin, acetaminophen, ibuprofen, naproxen, or ketoprofen unless instructed by your doctor. These medicines may hide a fever. Do not become pregnant while taking this medicine or for 6 months after stopping it. Women should inform their doctor if they wish to become pregnant or think they might be pregnant. Men should not father a child while taking this medicine or for 3 months after stopping it. There is a potential for serious side effects to an unborn child. Talk to your health care professional or pharmacist for more information. Do not breast-feed an infant while taking this medicine or for 2 weeks after stopping it. This medicine may  interfere with the ability to get pregnant or to father a child. You should talk to your doctor or health care professional if you are concerned about your fertility. What side effects may I notice from receiving this medication? Side effects that you should report to your doctor or health care professional as soon as possible: allergic reactions like skin rash, itching or hives, swelling of the face, lips, or tongue breathing problems changes in vision fast, irregular heartbeat low blood pressure mouth sores pain, tingling, numbness in the hands or feet signs of decreased platelets or bleeding - bruising, pinpoint red spots on the skin, black, tarry stools, blood in the urine signs of decreased red blood cells - unusually weak or tired, feeling faint or lightheaded, falls signs of infection - fever or chills, cough, sore throat, pain or difficulty passing urine signs and symptoms of liver injury like dark yellow or brown urine; general ill feeling or flu-like  symptoms; light-colored stools; loss of appetite; nausea; right upper belly pain; unusually weak or tired; yellowing of the eyes or skin swelling of the ankles, feet, hands unusually slow heartbeat Side effects that usually do not require medical attention (report to your doctor or health care professional if they continue or are bothersome): diarrhea hair loss loss of appetite nausea, vomiting tiredness This list may not describe all possible side effects. Call your doctor for medical advice about side effects. You may report side effects to FDA at 1-800-FDA-1088. Where should I keep my medication? This drug is given in a hospital or clinic and will not be stored at home. NOTE: This sheet is a summary. It may not cover all possible information. If you have questions about this medicine, talk to your doctor, pharmacist, or health care provider.  2023 Elsevier/Gold Standard (2017-01-28 00:00:00)

## 2022-01-31 NOTE — Progress Notes (Signed)
[facility] OFF[facility] PROGRESS NOTE   Diagnosis: Pancreas cancer  INTERVAL HISTORY:   [name] completed another cycle of gemcitabine/Abraxane on 01/17/2022.  No fever.  She reports increased malaise for several days following this cycle of chemotherapy.  No change in neuropathy symptoms.  She has intermittent numbness in the fingers.  Objective:  Vital signs in last 24 hours:  Blood pressure 134/80, pulse 94, temperature 98.2 F (36.8 C), temperature source Oral, resp. rate 18, height _0  (1.702 m), weight 189 lb (85.7 kg), last menstrual period 09/17/2012, SpO2 100 %.    HEENT: No thrush or ulcers Resp: Lungs clear bilaterally Cardio: Regular rate and rhythm GI: No hepatosplenomegaly, nontender Vascular: No leg edema  Skin: Hyperpigmented lesions over the face and trunk  Portacath/PICC-without erythema  Lab Results:  Lab Results  Component Value Date   WBC 4.9 01/31/2022   HGB 8.1 (L) 01/31/2022   HCT 25.4 (L) 01/31/2022   MCV 90.7 01/31/2022   PLT 456 (H) 01/31/2022   NEUTROABS 3.3 01/31/2022    CMP  Lab Results  Component Value Date   NA 133 (L) 01/31/2022   K 3.3 (L) 01/31/2022   CL 104 01/31/2022   CO2 21 (L) 01/31/2022   GLUCOSE 373 (H) 01/31/2022   BUN 15 01/31/2022   CREATININE 1.04 (H) 01/31/2022   CALCIUM 8.7 (L) 01/31/2022   PROT 6.9 01/31/2022   ALBUMIN 3.3 (L) 01/31/2022   AST 20 01/31/2022   ALT 19 01/31/2022   ALKPHOS 225 (H) 01/31/2022   BILITOT 0.4 01/31/2022   GFRNONAA >60 01/31/2022   GFRAA >60 02/07/2020    Lab Results  Component Value Date   HBZ169 64 (H) 01/17/2022    Medications: I have reviewed the patient's current medications.   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 Tumor invades the duodenal wall and vascular groove, resection margins negative, 4/34 lymph nodes positive MSI-stable, tumor showed instability in 2 loci as did adjacent normal  tissue Foundation 1-K-ras G12 V, microsatellite status and tumor mutation burden cannot be determined EUS FNA biopsy of pancreas mass on 07/03/2018-well-differentiated neuroendocrine tumor CTs 01/29/2019-ill-defined pancreas head mass, 5 pulmonary nodules-1 with a small amount of central cavitation, tumor abuts the left margin of the portal vein indistinct density surrounding, hepatic artery, complex cystic lesion of the right kidney, right adrenal mass-characterized as an adenoma on a [facility] MRI 12/21/2018 [facility] 03/03/2019-no focal pancreas activity, no tracer accumulation within the suspicious pulmonary nodules, left uterine mass with tracer accumulation felt to represent a leiomyoma Elevated preoperative CA 19-9--CA 19-9 186 on 01/18/2019 CT chest 04/16/2019-multiple bilateral pulmonary nodules, some with increased cavitation, stable in size Cycle 1 FOLFIRINOX 04/27/2019 Cycle 2 FOLFIRINOX 05/11/2019 Cycle 3 FOLFIRINOX 05/23/2019 Cycle 4 FOLFIRINOX 06/08/2019 Cycle 5 FOLFIRINOX 06/22/2019 CT chest 07/02/2019-stable size of bilateral pulmonary nodules.  Dominant cavitary lesions in both lungs show increased cavitation with thinner walls.  Stable 2.1 cm right adrenal nodule. 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 No definite evidence of metastatic disease in the abdomen or pelvis. Markedly enlarged and heterogeneous appearing uterus, likely to represent multifocal fibroids. 1 of these lesions appears to be an  exophytic subserosal fibroid in the posterior lateral aspect of the uterine body  on the left side although this comes in close proximity to the left adnexa such that a primary ovarian lesion is difficult to completely exclude. CTs 02/07/2020-slight enlargement of bilateral lung nodules, some are cavitary, no evidence of metastatic disease in the abdomen or pelvis, stable right adrenal nodule, uterine fibroids CTs 04/26/2020-mild enlargement of pulmonary nodules, slight increase in trace pelvic fluid, new soft tissue thickening inferior to the cecal tip suspicious for peritoneal metastasis CT 05/26/2020-improved appearance of soft tissue at the inferior tip of the cecum, mildly thickened short appendix-findings suggestive of resolving appendicitis, stable small bibasilar pulmonary nodules, fibroids Plan biopsy of right cecal tip soft tissue canceled secondary to radiologic improvement CT chest 08/02/2020-enlargement and progressive cavitation of multiple bilateral lung nodules.  Some new nodules are present. CTs 10/24/2020- increase in size of pulmonary nodules, no new nodules, no evidence of metastatic disease in the abdomen, stable right adrenal nodule CT 01/09/2021-slight interval enlargement of pulmonary nodules, stable right adrenal nodule Navigation bronchoscopy 01/30/2021-left lower lobe cavitary nodule FNA-adenocarcinoma, brushing-adenocarcinoma.  Left lower lobe lavage-adenocarcinoma.  Right upper lobe brushing and FNA biopsy of cavitary nodule-adenocarcinoma-immunohistochemical profile consistent with pancreas adenocarcinoma Cycle 1 gemcitabine/Abraxane 03/28/2021 Cycle 2 gemcitabine/Abraxane 04/11/2021 Cycle 3 gemcitabine/Abraxane 04/25/2021 Cycle 4 gemcitabine/Abraxane 05/09/2021 Cycle 5 gemcitabine/Abraxane 05/23/2021 CT chest 06/05/2021-interval cavitation of multiple pulmonary nodules, some nodules have decreased in size, no new or enlarging nodules Cycle 6 gemcitabine/Abraxane 06/06/2021 Cycle 7 gemcitabine/Abraxane 06/21/2021 Cycle 8 gemcitabine/Abraxane 07/05/2021 Cycle  9 Gemcitabine/Abraxane 07/19/2021 Cycle 10 gemcitabine 08/01/2021-Abraxane held secondary to neuropathy CT chest 08/13/2021-mild decrease in size and wall thickness of multiple cavitary nodules, no new or progressive disease in the chest, indeterminate low-attenuation right liver lesions Cycle 11 gemcitabine 08/16/2021-Abraxane held secondary to neuropathy Cycle 12 gemcitabine 08/30/2021-Abraxane held secondary to neuropathy Cycle 13 gemcitabine 09/13/2021-Abraxane held secondary to neuropathy Cycle 14 gemcitabine 09/27/2021-Abraxane held secondary to neuropathy Cycle 15 gemcitabine 10/11/2021-Abraxane held secondary to neuropathy CTs 10/22/2021-no change in multiple cavitary lung nodules, no evidence of disease progression, ill-defined hypodense lesion in the posterior right liver suspicious for metastatic disease Cycle 16 gemcitabine 10/25/2021 Cycle 17 gemcitabine 11/08/2021 Cycle 18 Gemcitabine 11/22/2021 Cycle 19 gemcitabine 12/06/2021 Cycle 20 Gemcitabine 12/20/2021 CT 12/31/2021-mild increase in size of bilateral pulmonary metastases, stable subtle continuation right liver lesions Cycle 20 gemcitabine/Abraxane 01/03/2022 Cycle 21 gemcitabine/Abraxane 01/17/2022 Cycle 22 gemcitabine/Abraxane 01/31/2022   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 appears unchanged.  She has progressive anemia.  The anemia is likely secondary to chemotherapy.  She received a Red cell transfusion earlier this year.  The malaise following the last cycle  of chemotherapy is likely in part related to anemia.  She agrees to a Red cell transfusion.  This will be scheduled for 02/01/2022.  She will complete another treatment with gemcitabine/Abraxane today.  She will return for an office visit and chemotherapy in 2 weeks.  She will be scheduled for a restaging CT evaluation in October.  Ms. Myron continues potassium and magnesium supplements.  [name], MD  01/31/2022  11:55 AM

## 2022-01-31 NOTE — Progress Notes (Signed)
Patient seen by Dr. Benay Spice today  Vitals are within treatment parameters.  Labs reviewed by Dr. Benay Spice and are within treatment parameters. No electrolytes for K+ or Mg+ today.  Per physician team, patient is ready for treatment and there are NO modifications to the treatment plan. Will need type and screen drawn for 2 units transfusion on 02/01/22.

## 2022-01-31 NOTE — Progress Notes (Signed)
Transfusion orders placed for 02/01/22 and [facility] called for pick up. Blood ticket sent to [facility].

## 2022-02-01 ENCOUNTER — Inpatient Hospital Stay: Payer: BC Managed Care – PPO

## 2022-02-01 DIAGNOSIS — N6320 Unspecified lump in the left breast, unspecified quadrant: Secondary | ICD-10-CM | POA: Diagnosis not present

## 2022-02-01 DIAGNOSIS — Z5111 Encounter for antineoplastic chemotherapy: Secondary | ICD-10-CM | POA: Diagnosis not present

## 2022-02-01 DIAGNOSIS — I10 Essential (primary) hypertension: Secondary | ICD-10-CM | POA: Diagnosis not present

## 2022-02-01 DIAGNOSIS — C7802 Secondary malignant neoplasm of left lung: Secondary | ICD-10-CM | POA: Diagnosis not present

## 2022-02-01 DIAGNOSIS — D649 Anemia, unspecified: Secondary | ICD-10-CM

## 2022-02-01 DIAGNOSIS — E1140 Type 2 diabetes mellitus with diabetic neuropathy, unspecified: Secondary | ICD-10-CM | POA: Diagnosis not present

## 2022-02-01 DIAGNOSIS — C250 Malignant neoplasm of head of pancreas: Secondary | ICD-10-CM | POA: Diagnosis not present

## 2022-02-01 DIAGNOSIS — Z79899 Other long term (current) drug therapy: Secondary | ICD-10-CM | POA: Diagnosis not present

## 2022-02-01 DIAGNOSIS — C7801 Secondary malignant neoplasm of right lung: Secondary | ICD-10-CM | POA: Diagnosis not present

## 2022-02-01 MED ORDER — HEPARIN SOD (PORK) LOCK FLUSH 100 UNIT/ML IV SOLN
500.0000 [IU] | Freq: Every day | INTRAVENOUS | Status: AC | PRN
  Administered 2022-02-01: 500 [IU]

## 2022-02-01 MED ORDER — SODIUM CHLORIDE 0.9% FLUSH
10.0000 mL | INTRAVENOUS | Status: AC | PRN
  Administered 2022-02-01: 10 mL

## 2022-02-01 MED ORDER — SODIUM CHLORIDE 0.9% IV SOLUTION
250.0000 mL | Freq: Once | INTRAVENOUS | Status: AC
  Administered 2022-02-01: 250 mL via INTRAVENOUS

## 2022-02-01 NOTE — Patient Instructions (Signed)
Blood Transfusion, Adult, Care After The following information offers guidance on how to care for yourself after your procedure. Your health care provider may also give you more specific instructions. If you have problems or questions, contact your health care provider. What can I expect after the procedure? After the procedure, it is common to have: Bruising and soreness where the IV was inserted. A headache. Follow these instructions at home: IV insertion site care     Follow instructions from your health care provider about how to take care of your IV insertion site. Make sure you: Wash your hands with soap and water for at least 20 seconds before and after you change your bandage (dressing). If soap and water are not available, use hand sanitizer. Change your dressing as told by your health care provider. Check your IV insertion site every day for signs of infection. Check for: Redness, swelling, or pain. Bleeding from the site. Warmth. Pus or a bad smell. General instructions Take over-the-counter and prescription medicines only as told by your health care provider. Rest as told by your health care provider. Return to your normal activities as told by your health care provider. Keep all follow-up visits. Lab tests may need to be done at certain periods to recheck your blood counts. Contact a health care provider if: You have itching or red, swollen areas of skin (hives). You have a fever or chills. You have pain in the head, back, or chest. You feel anxious or you feel weak after doing your normal activities. You have redness, swelling, warmth, or pain around the IV insertion site. You have blood coming from the IV insertion site that does not stop with pressure. You have pus or a bad smell coming from your IV insertion site. If you received your blood transfusion in an outpatient setting, you will be told whom to contact to report any reactions. Get help right away if: You  have symptoms of a serious allergic or immune system reaction, including: Trouble breathing or shortness of breath. Swelling of the face, feeling flushed, or widespread rash. Dark urine or blood in the urine. Fast heartbeat. These symptoms may be an emergency. Get help right away. Call 911. Do not wait to see if the symptoms will go away. Do not drive yourself to the hospital. Summary Bruising and soreness around the IV insertion site are common. Check your IV insertion site every day for signs of infection. Rest as told by your health care provider. Return to your normal activities as told by your health care provider. Get help right away for symptoms of a serious allergic or immune system reaction to the blood transfusion. This information is not intended to replace advice given to you by your health care provider. Make sure you discuss any questions you have with your health care provider. Document Revised: 08/24/2021 Document Reviewed: 08/24/2021 Elsevier Patient Education  2023 Elsevier Inc.  

## 2022-02-02 ENCOUNTER — Ambulatory Visit
Admission: EM | Admit: 2022-02-02 | Discharge: 2022-02-02 | Disposition: A | Discharge status: Home / Self Care | Payer: BC Managed Care – PPO

## 2022-02-02 DIAGNOSIS — K649 Unspecified hemorrhoids: Secondary | ICD-10-CM | POA: Diagnosis not present

## 2022-02-02 LAB — TYPE AND SCREEN
ABO/RH(D): AB POS
Antibody Screen: NEGATIVE
Unit division: 0
Unit division: 0

## 2022-02-02 LAB — BPAM RBC
Blood Product Expiration Date: 202309202359
Blood Product Expiration Date: 202309212359
ISSUE DATE / TIME: 202308250759
ISSUE DATE / TIME: 202308250759
Unit Type and Rh: 6200
Unit Type and Rh: 6200

## 2022-02-02 LAB — CANCER ANTIGEN 19-9: CA 19-9: 77 U/mL — ABNORMAL HIGH (ref 0–35)

## 2022-02-02 NOTE — Discharge Instructions (Signed)
Because the bleeding is stopped and you are not in any discomfort at this time, I believe at this point there is nothing further to do about your hemorrhoid other than conservative care.  I enclosed some patient education regarding hemorrhoids, care at home as well as high-fiber diet.  You are also welcome to try any medications listed in the handout for relief of your symptoms.  Thank you for visiting urgent care today.

## 2022-02-02 NOTE — ED Triage Notes (Signed)
The pt states yesterday she had a blood transfusion and last night while wiping rectal region she noticed a small amount of  blood on the tissue. The patient states she does have hemorrhoids.

## 2022-02-02 NOTE — ED Provider Notes (Signed)
[facility]    CSN: [id number] Arrival date & time: 02/02/22  0907    HISTORY   Chief Complaint  Patient presents with   Hemorrhoids   HPI Alyssa Ross is a pleasant, 56 y.o. female who presents to urgent care today. The pt states yesterday she had a blood transfusion on the 24th.  Patient states that last night after voiding, she wiped from her urethra to her perineum and then her anal opening because she felt like she passed gas while she was voiding.  Patient states she noticed a small amount of blood on the tissue.  Patient states she did not pass any stool during her visit to the toilet.  Patient denies bright red blood per rectum.  The patient states she has a history of a single hemorrhoid but at this time it is painless and is not bulging, adds it has bulged in the past but she has always been able to push it back in.  Patient states she is also not having any rectal or perianal itching.  Patient states she has not seen any further blood on toilet tissue since.  Patient states she routinely uses Preparation H when her hemorrhoid flares up because it helps but has not been using it because it has not been bothering her.  Patient states she is concerned that her transfusion has caused her to have rectal bleeding.  The history is provided by the patient.   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   Yeast vaginitis 2006   Patient Active Problem List   Diagnosis Date Noted   Essential (hemorrhagic) thrombocythemia ([place]) 08/20/2021   Goals of care, counseling/discussion 03/02/2021   Lung nodules 01/04/2021   Cavitary lesion of lung 11/15/2020   Chemotherapy-induced neuropathy ([place]) 07/31/2020   Port-A-Cath in place 04/27/2019   Pancreatic cancer ([place]) 03/04/2019   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   Genetic testing 02/23/2019   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   Family history of pancreatic cancer    Essential hypertension 12/22/2012   Pure hypercholesterolemia 12/22/2012   Uncontrolled type 2 diabetes mellitus with hyperglycemia, with long-term current use of insulin ([place]) 12/17/2012   Past Surgical History:  Procedure Laterality Date   BILIARY STENT PLACEMENT N/A 12/25/2018   Procedure: BILIARY STENT PLACEMENT;  Surgeon: Carol Ada, MD;  Location: [facility];  Service: Endoscopy;  Laterality: N/A;   BILIARY STENT PLACEMENT N/A 01/01/2019   Procedure: BILIARY STENT PLACEMENT;  Surgeon: Carol Ada, MD;  Location: [facility];  Service: Endoscopy;  Laterality: N/A;   BRONCHIAL BIOPSY  01/30/2021   Procedure: BRONCHIAL BIOPSIES;  Surgeon: Garner Nash, DO;  Location: [facility];  Service: Pulmonary;;   BRONCHIAL BRUSHINGS  01/30/2021   Procedure: BRONCHIAL BRUSHINGS;  Surgeon: Garner Nash, DO;  Location: [facility];  Service: Pulmonary;;   BRONCHIAL NEEDLE ASPIRATION BIOPSY  01/30/2021   Procedure: BRONCHIAL NEEDLE ASPIRATION BIOPSIES;  Surgeon: Garner Nash, DO;  Location: [facility];  Service: Pulmonary;;   BRONCHIAL WASHINGS  01/30/2021   Procedure: BRONCHIAL WASHINGS;  Surgeon: Garner Nash, DO;  Location: [facility];  Service: Pulmonary;;   DILATATION & CURETTAGE/HYSTEROSCOPY WITH TRUECLEAR N/A 01/20/2014   Procedure: DILATATION & CURETTAGE/HYSTEROSCOPY WITH  TRUCLEAR;  Surgeon: [name], MD;  Location: [facility];  Service: Gynecology;  Laterality: N/A;   DILATATION & CURRETTAGE/HYSTEROSCOPY WITH RESECTOCOPE N/A 01/20/2014   Procedure: DILATATION & CURETTAGE/HYSTEROSCOPY WITH RESECTOCOPE;  Surgeon: [name], MD;  Location: [facility];  Service: Gynecology;  Laterality: N/A;   ENDOSCOPIC RETROGRADE CHOLANGIOPANCREATOGRAPHY (ERCP) WITH PROPOFOL N/A 12/25/2018   Procedure: ENDOSCOPIC RETROGRADE CHOLANGIOPANCREATOGRAPHY (ERCP) WITH PROPOFOL;  Surgeon: Carol Ada, MD;  [facility];  Service: Endoscopy;  Laterality: N/A;   ENDOSCOPIC RETROGRADE CHOLANGIOPANCREATOGRAPHY (ERCP) WITH PROPOFOL N/A 01/01/2019   Procedure: ENDOSCOPIC RETROGRADE CHOLANGIOPANCREATOGRAPHY (ERCP) WITH PROPOFOL;  Surgeon: Carol Ada, MD;  [facility];  Service: Endoscopy;  Laterality: N/A;   ERCP  12/25/2018   FINE NEEDLE ASPIRATION N/A 01/01/2019   Procedure: FINE NEEDLE ASPIRATION (FNA) LINEAR;  Surgeon: Carol Ada, MD;  [facility];  Service: Endoscopy;  Laterality: N/A;   LAPAROSCOPY N/A 03/04/2019   Procedure: LAPAROSCOPY DIAGNOSTIC;  Surgeon: Stark Klein, MD;  Location: [facility];  Service: General;  Laterality: N/A;  GENERAL AND EPIDURAL   PARTIAL NEPHRECTOMY Right 03/04/2019   Procedure: Nephrectomy Partial;  Surgeon: Cleon Gustin, MD;  Location: [facility];  Service: Urology;  Laterality: Right;   PORTACATH PLACEMENT Left 04/21/2019   Procedure: INSERTION PORT-A-CATH;  Surgeon: Stark Klein, MD;  Location: [facility];  Service: General;  Laterality: Left;   SPHINCTEROTOMY  12/25/2018   Procedure: Joan Mayans;  Surgeon: Carol Ada, MD;  [facility];  Service: Endoscopy;;   STENT REMOVAL  01/01/2019   Procedure: STENT REMOVAL;  Surgeon: Carol Ada, MD;  [facility];  Service: Endoscopy;;   UPPER ESOPHAGEAL ENDOSCOPIC ULTRASOUND (EUS) N/A 01/01/2019   Procedure: UPPER ESOPHAGEAL ENDOSCOPIC ULTRASOUND  (EUS);  Surgeon: Carol Ada, MD;  Location: [facility];  Service: Endoscopy;  Laterality: N/A;   VIDEO BRONCHOSCOPY WITH ENDOBRONCHIAL NAVIGATION Bilateral 01/30/2021   Procedure: VIDEO BRONCHOSCOPY WITH ENDOBRONCHIAL NAVIGATION;  Surgeon: Garner Nash, DO;  Location: [facility];  Service: Pulmonary;  Laterality: Bilateral;  ION   VIDEO BRONCHOSCOPY WITH RADIAL ENDOBRONCHIAL ULTRASOUND  01/30/2021   Procedure: RADIAL ENDOBRONCHIAL ULTRASOUND;  Surgeon: Garner Nash, DO;  Location: [facility];  Service: Pulmonary;;   WHIPPLE PROCEDURE N/A 03/04/2019   Procedure: WHIPPLE PROCEDURE;  Surgeon: Stark Klein, MD;  Location: [facility];  Service: General;  Laterality: N/A;  GENERAL AND EPIDURAL   OB History   No obstetric history on file.    Home Medications    Prior to Admission medications   Medication Sig Start Date End Date Taking? Authorizing Provider  Continuous Blood Gluc Receiver (FREESTYLE LIBRE 14 DAY READER) DEVI USE READER TO CHECK BLOOD GLUCOSE WITH FREESTYLE LIBRE SENSORS. 12/25/17   Elayne Snare, MD  Continuous Blood Gluc Sensor (FREESTYLE LIBRE 2 SENSOR) MISC APPLY EVERY 14 DAYS 09/25/20   Elayne Snare, MD  glucose blood test strip Check sugar 2 times daily. 03/21/20   Elayne Snare, MD  Insulin Degludec (TRESIBA FLEXTOUCH [employer]) Inject into the skin. 12-18 Units    [provider]  insulin lispro (HUMALOG KWIKPEN) 200 UNIT/ML KwikPen  6 units with breakfast, 13 units with lunch, 16 units with dinner 05/17/21   [provider]  Insulin Pen Needle (BD PEN NEEDLE NANO U/F) 32G X 4 MM MISC USE TO INJECT INSULIN THREE TIMES A DAY 02/19/21   Elayne Snare, MD  lidocaine-prilocaine (EMLA) cream Apply 1 application topically as directed. Apply 1 hour prior to stick and cover with plastic wrap 05/23/21   Ladell Pier, MD  loratadine (CLARITIN) 10 MG tablet Take 10 mg by mouth at bedtime.     [provider]  magic mouthwash SOLN Take 5 mLs by mouth 4 (four) times  daily as needed for mouth pain. Patient not taking: Reported on 08/29/2021 06/27/21   Ladell Pier, MD  MAGNESIUM-OXIDE 400 (240 Mg) MG tablet TAKE 1 TABLET(400 MG) BY MOUTH THREE TIMES DAILY 04/27/21   Ladell Pier, MD  potassium chloride SA (KLOR-CON M) 20 MEQ tablet Take 1 tablet (20 mEq total) by mouth 4 (four) times daily. Patient not taking: Reported on 01/31/2022 07/04/21   Owens Shark, NP  prochlorperazine (COMPAZINE) 10 MG tablet TAKE 1 TABLET(10 MG) BY MOUTH EVERY 6 HOURS AS NEEDED FOR NAUSEA OR VOMITING Patient not taking: Reported on 08/20/2021 04/17/21   Ladell Pier, MD  rosuvastatin (CRESTOR) 10 MG tablet Take 1 tablet (10 mg total) by mouth at bedtime. 08/20/21   Glendale Chard, MD  spironolactone (ALDACTONE) 25 MG tablet Take 1 tablet (25 mg total) by mouth daily. 11/01/21 11/01/22  Glendale Chard, MD  valsartan (DIOVAN) 160 MG tablet One tab po twice daily 11/11/21   Glendale Chard, MD    Family History Family History  Problem Relation Age of Onset   Diabetes Mother    Dementia Mother    Hyperlipidemia Mother    Hypertension Mother    Irregular heart beat Mother    Diabetes Father    Hypertension Father    Hyperlipidemia Father    Down syndrome Sister    Diabetes Sister    Hyperlipidemia Brother    Heart Problems Brother    Goiter Maternal Aunt    Thyroid nodules Sister    Cancer Cousin 63       eye; maternal first cousin   Goiter Cousin    Cancer Paternal Aunt        unknown form of cancer   Cancer Cousin        unknown form of cancer; paternal first cousin   Pancreatic cancer Cousin 42       paternal first cousin   Cancer Cousin 70       unknown cancer; paternal first cousin   Cancer Cousin 51       unknown cancer; paternal first cousin   Social History Social History   Tobacco Use   Smoking status: Never   Smokeless tobacco: Never  Vaping Use   Vaping Use: Never used  Substance Use Topics   Alcohol use: Not Currently    Comment: rarely    Drug use: No   Allergies   Cherry and Lemon oil  Review of Systems Review of Systems Pertinent findings revealed after performing a 14 point review of systems has been noted in the history of present illness.  Physical Exam Triage Vital Signs ED Triage Vitals  Enc Vitals Group     BP 04/06/21 0827 (!) 147/82     Pulse Rate 04/06/21 0827 72     Resp 04/06/21 0827 18     Temp 04/06/21 0827 98.3  F (36.8 C)     Temp Source 04/06/21 0827 Oral     SpO2 04/06/21 0827 98 %     Weight --      Height --      Head Circumference --      Peak Flow --      Pain Score 04/06/21 0826 5     Pain Loc --      Pain Edu? --      Excl. in [place]? --   No data found.  Updated Vital Signs BP 133/82 (BP Location: Right Arm)   Pulse 100   Temp 98.4 F (36.9 C) (Oral)   Resp 18   LMP 09/17/2012   SpO2 97%   Physical Exam Vitals and nursing note reviewed.  Constitutional:      General: She is not in acute distress.    Appearance: Normal appearance.  HENT:     Head: Normocephalic and atraumatic.  Eyes:     Pupils: Pupils are equal, round, and reactive to light.  Cardiovascular:     Rate and Rhythm: Normal rate and regular rhythm.  Pulmonary:     Effort: Pulmonary effort is normal.     Breath sounds: Normal breath sounds.  Genitourinary:    Comments: Patient politely declines DRE today. Musculoskeletal:        General: Normal range of motion.     Cervical back: Normal range of motion and neck supple.  Skin:    General: Skin is warm and dry.  Neurological:     General: No focal deficit present.     Mental Status: She is alert and oriented to person, place, and time. Mental status is at baseline.  Psychiatric:        Mood and Affect: Mood normal.        Behavior: Behavior normal.        Thought Content: Thought content normal.        Judgment: Judgment normal.     Visual Acuity Right Eye Distance:   Left Eye Distance:   Bilateral Distance:    Right Eye Near:   Left Eye Near:     Bilateral Near:     UC Couse / Diagnostics / Procedures:     Radiology No results found.  Procedures Procedures (including critical care time) EKG  Pending results:  Labs Reviewed - No data to display  Medications Ordered in UC: Medications - No data to display  UC Diagnoses / Final Clinical Impressions(s)   I have reviewed the triage vital signs and the nursing notes.  Pertinent labs & imaging results that were available during my care of the patient were reviewed by me and considered in my medical decision making (see chart for details).    Final diagnoses:  Hemorrhoids, unspecified hemorrhoid type   Patient advised to monitor her hemorrhoid for further bleeding, pain and perianal itching.  Patient provided with education regarding hemorrhoids including self-care at home, over-the-counter medications to try, amounts of fiber in breakfast foods, diagram of various stages of hemorrhoids and recommended treatments at various stages.  All questions addressed.  ED Prescriptions   None    PDMP not reviewed this encounter.  Pending results:  Labs Reviewed - No data to display  Discharge Instructions:   Discharge Instructions      Because the bleeding is stopped and you are not in any discomfort at this time, I believe at this point there is nothing further to do about your hemorrhoid other than  conservative care.  I enclosed some patient education regarding hemorrhoids, care at home as well as high-fiber diet.  You are also welcome to try any medications listed in the handout for relief of your symptoms.  Thank you for visiting urgent care today.      Disposition Upon Discharge:  Condition: stable for discharge home  Patient presented with an acute illness with associated systemic symptoms and significant discomfort requiring urgent management. In my opinion, this is a condition that a prudent lay person (someone who possesses an average knowledge of health and  medicine) may potentially expect to result in complications if not addressed urgently such as respiratory distress, impairment of bodily function or dysfunction of bodily organs.   Routine symptom specific, illness specific and/or disease specific instructions were discussed with the patient and/or caregiver at length.   As such, the patient has been evaluated and assessed, work-up was performed and treatment was provided in alignment with urgent care protocols and evidence based medicine.  Patient/parent/caregiver has been advised that the patient may require follow up for further testing and treatment if the symptoms continue in spite of treatment, as clinically indicated and appropriate.  Patient/parent/caregiver has been advised to return to the [facility] or PCP if no better; to PCP or the Emergency Department if new signs and symptoms develop, or if the current signs or symptoms continue to change or worsen for further workup, evaluation and treatment as clinically indicated and appropriate  The patient will follow up with their current PCP if and as advised. If the patient does not currently have a PCP we will assist them in obtaining one.   The patient may need specialty follow up if the symptoms continue, in spite of conservative treatment and management, for further workup, evaluation, consultation and treatment as clinically indicated and appropriate.   Patient/parent/caregiver verbalized understanding and agreement of plan as discussed.  All questions were addressed during visit.  Please see discharge instructions below for further details of plan.  This office note has been dictated using Museum/gallery curator.  Unfortunately, this method of dictation can sometimes lead to typographical or grammatical errors.  I apologize for your inconvenience in advance if this occurs.  Please do not hesitate to reach out to me if clarification is needed.      Lynden Oxford Scales,  [place] 02/02/22 [phone]

## 2022-02-06 ENCOUNTER — Other Ambulatory Visit: Payer: Self-pay | Admitting: Oncology

## 2022-02-06 NOTE — Progress Notes (Signed)
ON [facility] - Pancreatic Adenocarcinoma  No Change  Continue With Treatment as Ordered.  Original Decision Date/Time: 03/02/2021 15:31     A cycle is every 28 days:     Nab-paclitaxel (protein bound)      Gemcitabine   **Always confirm dose/schedule in your pharmacy ordering system**  Patient Characteristics: Metastatic Disease, Second Line, MSS/pMMR or MSI Unknown, Fluoropyrimidine-Based Therapy First Line Therapeutic Status: Metastatic Disease Line of Therapy: Second Line Microsatellite/Mismatch Repair Status: MSS/pMMR Intent of Therapy: Non-Curative / Palliative Intent, Discussed with Patient

## 2022-02-14 ENCOUNTER — Inpatient Hospital Stay: Payer: BC Managed Care – PPO | Attending: Genetic Counselor

## 2022-02-14 ENCOUNTER — Inpatient Hospital Stay: Payer: BC Managed Care – PPO

## 2022-02-14 ENCOUNTER — Inpatient Hospital Stay (HOSPITAL_BASED_OUTPATIENT_CLINIC_OR_DEPARTMENT_OTHER): Payer: BC Managed Care – PPO | Admitting: Oncology

## 2022-02-14 ENCOUNTER — Ambulatory Visit: Payer: BC Managed Care – PPO | Admitting: Oncology

## 2022-02-14 ENCOUNTER — Encounter: Payer: Self-pay | Admitting: *Deleted

## 2022-02-14 ENCOUNTER — Other Ambulatory Visit: Payer: BC Managed Care – PPO

## 2022-02-14 ENCOUNTER — Ambulatory Visit: Payer: BC Managed Care – PPO

## 2022-02-14 VITALS — BP 113/75 | HR 76 | Temp 98.2°F | Resp 18 | Ht 67.0 in | Wt 184.6 lb

## 2022-02-14 DIAGNOSIS — E1140 Type 2 diabetes mellitus with diabetic neuropathy, unspecified: Secondary | ICD-10-CM | POA: Insufficient documentation

## 2022-02-14 DIAGNOSIS — Z85528 Personal history of other malignant neoplasm of kidney: Secondary | ICD-10-CM | POA: Diagnosis not present

## 2022-02-14 DIAGNOSIS — Z905 Acquired absence of kidney: Secondary | ICD-10-CM | POA: Diagnosis not present

## 2022-02-14 DIAGNOSIS — Z5111 Encounter for antineoplastic chemotherapy: Secondary | ICD-10-CM | POA: Insufficient documentation

## 2022-02-14 DIAGNOSIS — I10 Essential (primary) hypertension: Secondary | ICD-10-CM | POA: Diagnosis not present

## 2022-02-14 DIAGNOSIS — Z79899 Other long term (current) drug therapy: Secondary | ICD-10-CM | POA: Insufficient documentation

## 2022-02-14 DIAGNOSIS — C250 Malignant neoplasm of head of pancreas: Secondary | ICD-10-CM

## 2022-02-14 DIAGNOSIS — C7802 Secondary malignant neoplasm of left lung: Secondary | ICD-10-CM | POA: Diagnosis not present

## 2022-02-14 DIAGNOSIS — C7801 Secondary malignant neoplasm of right lung: Secondary | ICD-10-CM | POA: Insufficient documentation

## 2022-02-14 LAB — CMP (CANCER CENTER ONLY)
ALT: 20 U/L (ref 0–44)
AST: 16 U/L (ref 15–41)
Albumin: 3.7 g/dL (ref 3.5–5.0)
Alkaline Phosphatase: 201 U/L — ABNORMAL HIGH (ref 38–126)
Anion gap: 5 (ref 5–15)
BUN: 9 mg/dL (ref 6–20)
CO2: 25 mmol/L (ref 22–32)
Calcium: 8.9 mg/dL (ref 8.9–10.3)
Chloride: 104 mmol/L (ref 98–111)
Creatinine: 1.07 mg/dL — ABNORMAL HIGH (ref 0.44–1.00)
GFR, Estimated: >60 mL/min (ref >60)
Glucose, Bld: 329 mg/dL — ABNORMAL HIGH (ref 70–99)
Potassium: 4.2 mmol/L (ref 3.5–5.1)
Sodium: 134 mmol/L — ABNORMAL LOW (ref 135–145)
Total Bilirubin: 0.5 mg/dL (ref 0.3–1.2)
Total Protein: 7.7 g/dL (ref 6.5–8.1)

## 2022-02-14 LAB — CBC WITH DIFFERENTIAL (CANCER CENTER ONLY)
Abs Immature Granulocytes: 0.02 10*3/uL (ref 0.00–0.07)
Basophils Absolute: 0 10*3/uL (ref 0.0–0.1)
Basophils Relative: 1 %
Eosinophils Absolute: 0.1 10*3/uL (ref 0.0–0.5)
Eosinophils Relative: 2 %
HCT: 30.4 % — ABNORMAL LOW (ref 36.0–46.0)
Hemoglobin: 10 g/dL — ABNORMAL LOW (ref 12.0–15.0)
Immature Granulocytes: 0 %
Lymphocytes Relative: 14 %
Lymphs Abs: 1 10*3/uL (ref 0.7–4.0)
MCH: 29.4 pg (ref 26.0–34.0)
MCHC: 32.9 g/dL (ref 30.0–36.0)
MCV: 89.4 fL (ref 80.0–100.0)
Monocytes Absolute: 1 10*3/uL (ref 0.1–1.0)
Monocytes Relative: 14 %
Neutro Abs: 4.9 10*3/uL (ref 1.7–7.7)
Neutrophils Relative %: 69 %
Platelet Count: 345 10*3/uL (ref 150–400)
RBC: 3.4 MIL/uL — ABNORMAL LOW (ref 3.87–5.11)
RDW: 15.8 % — ABNORMAL HIGH (ref 11.5–15.5)
WBC Count: 7.1 10*3/uL (ref 4.0–10.5)
nRBC: 0 % (ref 0.0–0.2)

## 2022-02-14 LAB — MAGNESIUM: Magnesium: 1.8 mg/dL (ref 1.7–2.4)

## 2022-02-14 MED ORDER — SODIUM CHLORIDE 0.9 % IV SOLN
2000.0000 mg | Freq: Once | INTRAVENOUS | Status: AC
  Administered 2022-02-14: 2000 mg via INTRAVENOUS
  Filled 2022-02-14: qty 52.6

## 2022-02-14 MED ORDER — SODIUM CHLORIDE 0.9% FLUSH
10.0000 mL | INTRAVENOUS | Status: DC | PRN
  Administered 2022-02-14: 10 mL

## 2022-02-14 MED ORDER — SODIUM CHLORIDE 0.9 % IV SOLN: Freq: Once | INTRAVENOUS | Status: AC

## 2022-02-14 MED ORDER — PACLITAXEL PROTEIN-BOUND CHEMO INJECTION 100 MG
100.0000 mg/m2 | Freq: Once | INTRAVENOUS | Status: AC
  Administered 2022-02-14: 200 mg via INTRAVENOUS
  Filled 2022-02-14: qty 40

## 2022-02-14 MED ORDER — PROCHLORPERAZINE MALEATE 10 MG PO TABS
10.0000 mg | ORAL_TABLET | Freq: Once | ORAL | Status: AC
  Administered 2022-02-14: 10 mg via ORAL
  Filled 2022-02-14: qty 1

## 2022-02-14 MED ORDER — MAGNESIUM OXIDE -MG SUPPLEMENT 400 (240 MG) MG PO TABS: 400.0000 mg | ORAL_TABLET | Freq: Two times a day (BID) | ORAL | 2 refills | Status: DC

## 2022-02-14 MED ORDER — HEPARIN SOD (PORK) LOCK FLUSH 100 UNIT/ML IV SOLN
500.0000 [IU] | Freq: Once | INTRAVENOUS | Status: AC | PRN
  Administered 2022-02-14: 500 [IU]

## 2022-02-14 NOTE — Patient Instructions (Signed)

## 2022-02-14 NOTE — Addendum Note (Signed)
Addended by: Tania Ade on: 02/14/2022 10:22 AM   Modules accepted: Orders

## 2022-02-14 NOTE — Progress Notes (Signed)
[place] OFFICE PROGRESS NOTE   Diagnosis: Pancreas cancer  INTERVAL HISTORY:   Alyssa Ross completed another cycle of gemcitabine/Abraxane on 01/31/2022.  No fever.  No change in her chronic rash.  No change in neuropathy symptoms.  She received a Red cell transfusion on 02/01/2022.  She reports an improved energy level following the Red cell transfusion.  Objective:  Vital signs in last 24 hours:  Blood pressure 113/75, pulse 76, temperature 98.2 F (36.8 C), temperature source Temporal, resp. rate 18, height $RemoveBe'5\' 7"'qrkByTBUA$  (1.702 m), weight 184 lb 9.6 oz (83.7 kg), last menstrual period 09/17/2012, SpO2 100 %.    HEENT: No thrush or ulcers Resp: Lungs clear bilaterally Cardio: Regular rate and rhythm GI: No hepatosplenomegaly, no mass, nontender Vascular: No leg edema Skin: Hyperpigmented lesions over the face  Portacath/PICC-without erythema  Lab Results:  Lab Results  Component Value Date   WBC 7.1 02/14/2022   HGB 10.0 (L) 02/14/2022   HCT 30.4 (L) 02/14/2022   MCV 89.4 02/14/2022   PLT 345 02/14/2022   NEUTROABS 4.9 02/14/2022    CMP  Lab Results  Component Value Date   NA 134 (L) 02/14/2022   K 4.2 02/14/2022   CL 104 02/14/2022   CO2 25 02/14/2022   GLUCOSE 329 (H) 02/14/2022   BUN 9 02/14/2022   CREATININE 1.07 (H) 02/14/2022   CALCIUM 8.9 02/14/2022   PROT 7.7 02/14/2022   ALBUMIN 3.7 02/14/2022   AST 16 02/14/2022   ALT 20 02/14/2022   ALKPHOS 201 (H) 02/14/2022   BILITOT 0.5 02/14/2022   GFRNONAA >60 02/14/2022   GFRAA >60 02/07/2020    Lab Results  Component Value Date   CAN199 77 (H) 01/31/2022    Medications: I have reviewed the patient's current medications.   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 Tumor invades the duodenal wall and vascular groove, resection margins negative, 4/34 lymph nodes positive MSI-stable, tumor showed instability in 2 loci as did  adjacent normal tissue Foundation 1-K-ras G12 V, microsatellite status and tumor mutation burden cannot be determined EUS FNA biopsy of pancreas mass on 07/03/2018-well-differentiated neuroendocrine tumor CTs 01/29/2019-ill-defined pancreas head mass, 5 pulmonary nodules-1 with a small amount of central cavitation, tumor abuts the left margin of the portal vein indistinct density surrounding, hepatic artery, complex cystic lesion of the right kidney, right adrenal mass-characterized as an adenoma on a [facility] MRI 12/21/2018 [facility] 03/03/2019-no focal pancreas activity, no tracer accumulation within the suspicious pulmonary nodules, left uterine mass with tracer accumulation felt to represent a leiomyoma Elevated preoperative CA 19-9--CA 19-9 186 on 01/18/2019 CT chest 04/16/2019-multiple bilateral pulmonary nodules, some with increased cavitation, stable in size Cycle 1 FOLFIRINOX 04/27/2019 Cycle 2 FOLFIRINOX 05/11/2019 Cycle 3 FOLFIRINOX 05/23/2019 Cycle 4 FOLFIRINOX 06/08/2019 Cycle 5 FOLFIRINOX 06/22/2019 CT chest 07/02/2019-stable size of bilateral pulmonary nodules.  Dominant cavitary lesions in both lungs show increased cavitation with thinner walls.  Stable 2.1 cm right adrenal nodule. 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 No definite evidence of metastatic disease in the abdomen or pelvis. Markedly enlarged and heterogeneous appearing uterus, likely to represent multifocal fibroids. 1 of these lesions appears  to be an exophytic subserosal fibroid in the posterior lateral aspect of  the uterine body on the left side although this comes in close proximity to the left adnexa such that a primary ovarian lesion is difficult to completely exclude. CTs 02/07/2020-slight enlargement of bilateral lung nodules, some are cavitary, no evidence of metastatic disease in the abdomen or pelvis, stable right adrenal nodule, uterine fibroids CTs 04/26/2020-mild enlargement of pulmonary nodules, slight increase in trace pelvic fluid, new soft tissue thickening inferior to the cecal tip suspicious for peritoneal metastasis CT 05/26/2020-improved appearance of soft tissue at the inferior tip of the cecum, mildly thickened short appendix-findings suggestive of resolving appendicitis, stable small bibasilar pulmonary nodules, fibroids Plan biopsy of right cecal tip soft tissue canceled secondary to radiologic improvement CT chest 08/02/2020-enlargement and progressive cavitation of multiple bilateral lung nodules.  Some new nodules are present. CTs 10/24/2020- increase in size of pulmonary nodules, no new nodules, no evidence of metastatic disease in the abdomen, stable right adrenal nodule CT 01/09/2021-slight interval enlargement of pulmonary nodules, stable right adrenal nodule Navigation bronchoscopy 01/30/2021-left lower lobe cavitary nodule FNA-adenocarcinoma, brushing-adenocarcinoma.  Left lower lobe lavage-adenocarcinoma.  Right upper lobe brushing and FNA biopsy of cavitary nodule-adenocarcinoma-immunohistochemical profile consistent with pancreas adenocarcinoma Cycle 1 gemcitabine/Abraxane 03/28/2021 Cycle 2 gemcitabine/Abraxane 04/11/2021 Cycle 3 gemcitabine/Abraxane 04/25/2021 Cycle 4 gemcitabine/Abraxane 05/09/2021 Cycle 5 gemcitabine/Abraxane 05/23/2021 CT chest 06/05/2021-interval cavitation of multiple pulmonary nodules, some nodules have decreased in size, no new or enlarging nodules Cycle 6 gemcitabine/Abraxane 06/06/2021 Cycle 7 gemcitabine/Abraxane 06/21/2021 Cycle 8 gemcitabine/Abraxane  07/05/2021 Cycle 9 Gemcitabine/Abraxane 07/19/2021 Cycle 10 gemcitabine 08/01/2021-Abraxane held secondary to neuropathy CT chest 08/13/2021-mild decrease in size and wall thickness of multiple cavitary nodules, no new or progressive disease in the chest, indeterminate low-attenuation right liver lesions Cycle 11 gemcitabine 08/16/2021-Abraxane held secondary to neuropathy Cycle 12 gemcitabine 08/30/2021-Abraxane held secondary to neuropathy Cycle 13 gemcitabine 09/13/2021-Abraxane held secondary to neuropathy Cycle 14 gemcitabine 09/27/2021-Abraxane held secondary to neuropathy Cycle 15 gemcitabine 10/11/2021-Abraxane held secondary to neuropathy CTs 10/22/2021-no change in multiple cavitary lung nodules, no evidence of disease progression, ill-defined hypodense lesion in the posterior right liver suspicious for metastatic disease Cycle 16 gemcitabine 10/25/2021 Cycle 17 gemcitabine 11/08/2021 Cycle 18 Gemcitabine 11/22/2021 Cycle 19 gemcitabine 12/06/2021 Cycle 20 Gemcitabine 12/20/2021 CT 12/31/2021-mild increase in size of bilateral pulmonary metastases, stable subtle continuation right liver lesions Cycle 20 gemcitabine/Abraxane 01/03/2022 Cycle 21 gemcitabine/Abraxane 01/17/2022 Cycle 22 gemcitabine/Abraxane 01/31/2022 Cycle 23 gemcitabine/Abraxane 02/14/2022   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  She continues to tolerate the gemcitabine/Abraxane well.  The CA 19-9  with slightly higher when she was here on 01/31/2022.  We will follow-up on the CA 19-9 from today.  She will complete another cycle of gemcitabine/Abraxane today.  The plan is to schedule a restaging chest CT after the cycle of chemotherapy 02/28/2022.  [name], MD  02/14/2022  10:09 AM

## 2022-02-14 NOTE — Progress Notes (Signed)
Patient seen by Dr. Sherrill today ? ?Vitals are within treatment parameters. ? ?Labs reviewed by Dr. Sherrill and are within treatment parameters. ? ?Per physician team, patient is ready for treatment and there are NO modifications to the treatment plan.  ?

## 2022-02-15 LAB — CANCER ANTIGEN 19-9: CA 19-9: 84 U/mL — ABNORMAL HIGH (ref 0–35)

## 2022-02-20 DIAGNOSIS — Z713 Dietary counseling and surveillance: Secondary | ICD-10-CM | POA: Diagnosis not present

## 2022-02-21 ENCOUNTER — Ambulatory Visit (INDEPENDENT_AMBULATORY_CARE_PROVIDER_SITE_OTHER): Payer: BC Managed Care – PPO | Admitting: Internal Medicine

## 2022-02-21 ENCOUNTER — Encounter: Payer: Self-pay | Admitting: Internal Medicine

## 2022-02-21 VITALS — BP 110/80 | HR 100 | Temp 98.5°F | Ht 67.0 in | Wt 184.0 lb

## 2022-02-21 DIAGNOSIS — Z6828 Body mass index (BMI) 28.0-28.9, adult: Secondary | ICD-10-CM

## 2022-02-21 DIAGNOSIS — Z1211 Encounter for screening for malignant neoplasm of colon: Secondary | ICD-10-CM

## 2022-02-21 DIAGNOSIS — Z23 Encounter for immunization: Secondary | ICD-10-CM | POA: Diagnosis not present

## 2022-02-21 DIAGNOSIS — E1165 Type 2 diabetes mellitus with hyperglycemia: Secondary | ICD-10-CM

## 2022-02-21 DIAGNOSIS — D473 Essential (hemorrhagic) thrombocythemia: Secondary | ICD-10-CM

## 2022-02-21 DIAGNOSIS — I10 Essential (primary) hypertension: Secondary | ICD-10-CM

## 2022-02-21 DIAGNOSIS — Z794 Long term (current) use of insulin: Secondary | ICD-10-CM

## 2022-02-21 MED ORDER — ROSUVASTATIN CALCIUM 10 MG PO TABS: 10.0000 mg | ORAL_TABLET | Freq: Every day | ORAL | 2 refills | Status: DC

## 2022-02-21 MED ORDER — SPIRONOLACTONE 25 MG PO TABS: 25.0000 mg | ORAL_TABLET | Freq: Every day | ORAL | 2 refills | Status: DC

## 2022-02-21 NOTE — Patient Instructions (Signed)

## 2022-02-21 NOTE — Progress Notes (Signed)
Rich Brave [place],acting as a Education administrator for Maximino Greenland, MD.,have documented all relevant documentation on the behalf of Maximino Greenland, MD,as directed by  Maximino Greenland, MD while in the presence of Maximino Greenland, MD.    Subjective:     Patient ID: Alyssa Ross , female    DOB: February 15, 1966 , 56 y.o.   MRN: [id number]   Chief Complaint  Patient presents with   Hypertension   Diabetes    HPI  She is here today for BP/DM check. She is followed by Endo for diabetes med management. She reports compliance with meds.      Hypertension This is a chronic problem. The current episode started more than 1 year ago. The problem has been gradually improving since onset. The problem is uncontrolled. Pertinent negatives include no blurred vision. Past treatments include ACE inhibitors and diuretics. The current treatment provides moderate improvement.  Diabetes She presents for her follow-up diabetic visit. She has type 2 diabetes mellitus. There are no hypoglycemic associated symptoms. Pertinent negatives for diabetes include no blurred vision, no polydipsia, no polyphagia and no polyuria. There are no hypoglycemic complications. Risk factors for coronary artery disease include diabetes mellitus, hypertension and dyslipidemia. Current diabetic treatment includes insulin injections. She is compliant with treatment most of the time. She is following a diabetic diet. She participates in exercise intermittently. Eye exam is current.     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   Lung nodule    pancreatic ca dx'd  11/2018   Neuroendocrine Tumor of the pancreas   PONV (postoperative nausea and vomiting)    nausea  vomitting after 12/25/18 ERCP   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   Yeast vaginitis 2006     Family History  Problem Relation Age of Onset   Diabetes Mother    Dementia Mother    Hyperlipidemia Mother    Hypertension Mother    Irregular heart beat Mother    Diabetes Father    Hypertension Father    Hyperlipidemia Father    Down syndrome Sister    Diabetes Sister    Hyperlipidemia Brother    Heart Problems Brother    Goiter Maternal Aunt    Thyroid nodules Sister    Cancer Cousin 84       eye; maternal first cousin   Goiter Cousin    Cancer Paternal Aunt        unknown form of cancer   Cancer Cousin        unknown form of cancer; paternal first cousin   Pancreatic cancer Cousin 110       paternal first cousin   Cancer Cousin 43       unknown cancer; paternal first cousin   Cancer Cousin 29       unknown cancer; paternal first cousin     Current Outpatient Medications:    Continuous Blood Gluc Receiver (FREESTYLE LIBRE 14 DAY READER) DEVI, USE READER TO CHECK BLOOD GLUCOSE WITH FREESTYLE LIBRE SENSORS., Disp: 1 Device, Rfl: 0   Continuous Blood Gluc  Sensor (FREESTYLE LIBRE 2 SENSOR) MISC, APPLY EVERY 14 DAYS, Disp: 2 each, Rfl: 5   glucose blood test strip, Check sugar 2 times daily., Disp: 100 each, Rfl: 12   Insulin Degludec (TRESIBA FLEXTOUCH ), Inject into the skin. 12-18 Units, Disp: , Rfl:    insulin lispro (HUMALOG KWIKPEN) 200 UNIT/ML KwikPen, 6 units with breakfast, 13 units with lunch, 16 units with dinner, Disp: , Rfl:    Insulin Pen Needle (BD PEN NEEDLE NANO U/F) 32G X 4 MM MISC, USE TO INJECT INSULIN THREE TIMES A DAY, Disp: 270 each, Rfl: 3   lidocaine-prilocaine (EMLA) cream, Apply 1 application topically as directed. Apply 1 hour prior to stick and cover with plastic wrap, Disp: 30 g, Rfl: 5   loratadine (CLARITIN) 10 MG tablet, Take 10 mg  by mouth at bedtime. , Disp: , Rfl:    magic mouthwash SOLN, Take 5 mLs by mouth 4 (four) times daily as needed for mouth pain., Disp: 240 mL, Rfl: 0   MAGnesium-Oxide 400 (240 Mg) MG tablet, Take 1 tablet (400 mg total) by mouth 2 (two) times daily., Disp: 60 tablet, Rfl: 2   potassium chloride SA (KLOR-CON M) 20 MEQ tablet, Take 1 tablet (20 mEq total) by mouth 4 (four) times daily. (Patient taking differently: Take 20 mEq by mouth 2 (two) times daily.), Disp: 120 tablet, Rfl: 2   prochlorperazine (COMPAZINE) 10 MG tablet, TAKE 1 TABLET(10 MG) BY MOUTH EVERY 6 HOURS AS NEEDED FOR NAUSEA OR VOMITING, Disp: 60 tablet, Rfl: 1   valsartan (DIOVAN) 160 MG tablet, One tab po twice daily, Disp: 180 tablet, Rfl: 1   rosuvastatin (CRESTOR) 10 MG tablet, Take 1 tablet (10 mg total) by mouth at bedtime., Disp: 90 tablet, Rfl: 2   spironolactone (ALDACTONE) 25 MG tablet, Take 1 tablet (25 mg total) by mouth daily., Disp: 90 tablet, Rfl: 2   Allergies  Allergen Reactions   Cherry Rash, Itching and Other (See Comments)    Other reaction(s): Unknown Other reaction(s): Unknown   Lemon Oil Rash     Review of Systems  Constitutional: Negative.   Eyes: Negative.  Negative for blurred vision.  Respiratory: Negative.    Cardiovascular: Negative.   Endocrine: Negative for polydipsia, polyphagia and polyuria.  Musculoskeletal: Negative.   Skin: Negative.   Neurological: Negative.   Psychiatric/Behavioral: Negative.       Today's Vitals   02/21/22 0820  BP: 110/80  Pulse: 100  Temp: 98.5 F (36.9 C)  Weight: 184 lb (83.5 kg)  Height: 5\' 7"  (1.702 m)  PainSc: 0-No pain   Body mass index is 28.82 kg/m.  Wt Readings from Last 3 Encounters:  02/28/22 189 lb (85.7 kg)  02/21/22 184 lb (83.5 kg)  02/14/22 184 lb 9.6 oz (83.7 kg)     Objective:  Physical Exam Vitals and nursing note reviewed.  Constitutional:      Appearance: Normal appearance.  HENT:     Head: Normocephalic and atraumatic.   Eyes:     Extraocular Movements: Extraocular movements intact.  Cardiovascular:     Rate and Rhythm: Normal rate and regular rhythm.     Heart sounds: Normal heart sounds.  Pulmonary:     Effort: Pulmonary effort is normal.     Breath sounds: Normal breath sounds.  Musculoskeletal:     Cervical back: Normal range of motion.  Skin:    General: Skin is warm.  Neurological:     General: No focal deficit present.  Mental Status: She is alert.  Psychiatric:        Mood and Affect: Mood normal.        Behavior: Behavior normal.      Assessment And Plan:     1. Essential hypertension Comments: Chronic, well controlled. She will c/w valsartan 160mg  once daily and spironolactone 25mg  daily.   2. Uncontrolled type 2 diabetes mellitus with hyperglycemia, with long-term current use of insulin (HCC) Comments: Chronic, I planned to check an a1c; however, she prefers to have done with Endo in 2 weeks. Last a1c 7.7 in Care Everywhere.  3. Essential (hemorrhagic) thrombocythemia ([place]) Comments: Most recent lab results reviewed, platelets are now within normal limits.   4. BMI 28.0-28.9,adult Comments: She is encouraged to gradually increase exercise, aiming for at least 150 minutes of exercise per week.   5. Immunization due - Tdap vaccine greater than or equal to 7yo IM  6. Screen for colon cancer Comments: Last Cologuard March 2020. She is now due for a repeat study. She prefers to discuss further with her oncologist.    Patient was given opportunity to ask questions. Patient verbalized understanding of the plan and was able to repeat key elements of the plan. All questions were answered to their satisfaction.   I, Maximino Greenland, MD, have reviewed all documentation for this visit. The documentation on 02/21/22 for the exam, diagnosis, procedures, and orders are all accurate and complete.   IF YOU HAVE BEEN REFERRED TO A SPECIALIST, IT MAY TAKE 1-2 WEEKS TO SCHEDULE/PROCESS THE  REFERRAL. IF YOU HAVE NOT HEARD FROM US/SPECIALIST IN TWO WEEKS, PLEASE GIVE [place] A CALL AT [phone].   THE PATIENT IS ENCOURAGED TO PRACTICE SOCIAL DISTANCING DUE TO THE COVID-19 PANDEMIC.

## 2022-02-28 ENCOUNTER — Telehealth: Payer: Self-pay

## 2022-02-28 ENCOUNTER — Inpatient Hospital Stay: Payer: BC Managed Care – PPO

## 2022-02-28 ENCOUNTER — Inpatient Hospital Stay (HOSPITAL_BASED_OUTPATIENT_CLINIC_OR_DEPARTMENT_OTHER): Payer: BC Managed Care – PPO | Admitting: Nurse Practitioner

## 2022-02-28 ENCOUNTER — Encounter: Payer: Self-pay | Admitting: Nurse Practitioner

## 2022-02-28 VITALS — BP 129/82 | HR 73

## 2022-02-28 VITALS — BP 124/79 | HR 96 | Temp 98.2°F | Resp 18 | Ht 67.0 in | Wt 189.0 lb

## 2022-02-28 DIAGNOSIS — C7801 Secondary malignant neoplasm of right lung: Secondary | ICD-10-CM | POA: Diagnosis not present

## 2022-02-28 DIAGNOSIS — Z905 Acquired absence of kidney: Secondary | ICD-10-CM | POA: Diagnosis not present

## 2022-02-28 DIAGNOSIS — C250 Malignant neoplasm of head of pancreas: Secondary | ICD-10-CM | POA: Diagnosis not present

## 2022-02-28 DIAGNOSIS — C7802 Secondary malignant neoplasm of left lung: Secondary | ICD-10-CM | POA: Diagnosis not present

## 2022-02-28 DIAGNOSIS — Z79899 Other long term (current) drug therapy: Secondary | ICD-10-CM | POA: Diagnosis not present

## 2022-02-28 DIAGNOSIS — I10 Essential (primary) hypertension: Secondary | ICD-10-CM | POA: Diagnosis not present

## 2022-02-28 DIAGNOSIS — Z85528 Personal history of other malignant neoplasm of kidney: Secondary | ICD-10-CM | POA: Diagnosis not present

## 2022-02-28 DIAGNOSIS — E1140 Type 2 diabetes mellitus with diabetic neuropathy, unspecified: Secondary | ICD-10-CM | POA: Diagnosis not present

## 2022-02-28 DIAGNOSIS — Z5111 Encounter for antineoplastic chemotherapy: Secondary | ICD-10-CM | POA: Diagnosis not present

## 2022-02-28 LAB — CBC WITH DIFFERENTIAL (CANCER CENTER ONLY)
Abs Immature Granulocytes: 0.02 10*3/uL (ref 0.00–0.07)
Basophils Absolute: 0 10*3/uL (ref 0.0–0.1)
Basophils Relative: 1 %
Eosinophils Absolute: 0.2 10*3/uL (ref 0.0–0.5)
Eosinophils Relative: 3 %
HCT: 28.8 % — ABNORMAL LOW (ref 36.0–46.0)
Hemoglobin: 9 g/dL — ABNORMAL LOW (ref 12.0–15.0)
Immature Granulocytes: 0 %
Lymphocytes Relative: 13 %
Lymphs Abs: 0.8 10*3/uL (ref 0.7–4.0)
MCH: 28.6 pg (ref 26.0–34.0)
MCHC: 31.3 g/dL (ref 30.0–36.0)
MCV: 91.4 fL (ref 80.0–100.0)
Monocytes Absolute: 1 10*3/uL (ref 0.1–1.0)
Monocytes Relative: 16 %
Neutro Abs: 4.1 10*3/uL (ref 1.7–7.7)
Neutrophils Relative %: 67 %
Platelet Count: 395 10*3/uL (ref 150–400)
RBC: 3.15 MIL/uL — ABNORMAL LOW (ref 3.87–5.11)
RDW: 16.4 % — ABNORMAL HIGH (ref 11.5–15.5)
WBC Count: 6.1 10*3/uL (ref 4.0–10.5)
nRBC: 0 % (ref 0.0–0.2)

## 2022-02-28 LAB — CMP (CANCER CENTER ONLY)
ALT: 20 U/L (ref 0–44)
AST: 27 U/L (ref 15–41)
Albumin: 3.2 g/dL — ABNORMAL LOW (ref 3.5–5.0)
Alkaline Phosphatase: 186 U/L — ABNORMAL HIGH (ref 38–126)
Anion gap: 7 (ref 5–15)
BUN: 7 mg/dL (ref 6–20)
CO2: 24 mmol/L (ref 22–32)
Calcium: 8.6 mg/dL — ABNORMAL LOW (ref 8.9–10.3)
Chloride: 106 mmol/L (ref 98–111)
Creatinine: 1.03 mg/dL — ABNORMAL HIGH (ref 0.44–1.00)
GFR, Estimated: >60 mL/min (ref >60)
Glucose, Bld: 215 mg/dL — ABNORMAL HIGH (ref 70–99)
Potassium: 3.6 mmol/L (ref 3.5–5.1)
Sodium: 137 mmol/L (ref 135–145)
Total Bilirubin: 0.4 mg/dL (ref 0.3–1.2)
Total Protein: 6.8 g/dL (ref 6.5–8.1)

## 2022-02-28 LAB — MAGNESIUM: Magnesium: 1.7 mg/dL (ref 1.7–2.4)

## 2022-02-28 MED ORDER — SODIUM CHLORIDE 0.9 % IV SOLN
2000.0000 mg | Freq: Once | INTRAVENOUS | Status: AC
  Administered 2022-02-28: 2000 mg via INTRAVENOUS
  Filled 2022-02-28: qty 52.6

## 2022-02-28 MED ORDER — SODIUM CHLORIDE 0.9% FLUSH
10.0000 mL | INTRAVENOUS | Status: DC | PRN
  Administered 2022-02-28: 10 mL

## 2022-02-28 MED ORDER — SODIUM CHLORIDE 0.9 % IV SOLN: Freq: Once | INTRAVENOUS | Status: AC

## 2022-02-28 MED ORDER — PROCHLORPERAZINE MALEATE 10 MG PO TABS
10.0000 mg | ORAL_TABLET | Freq: Once | ORAL | Status: AC
  Administered 2022-02-28: 10 mg via ORAL
  Filled 2022-02-28: qty 1

## 2022-02-28 MED ORDER — PACLITAXEL PROTEIN-BOUND CHEMO INJECTION 100 MG
100.0000 mg/m2 | Freq: Once | INTRAVENOUS | Status: AC
  Administered 2022-02-28: 200 mg via INTRAVENOUS
  Filled 2022-02-28: qty 40

## 2022-02-28 MED ORDER — HEPARIN SOD (PORK) LOCK FLUSH 100 UNIT/ML IV SOLN
500.0000 [IU] | Freq: Once | INTRAVENOUS | Status: AC | PRN
  Administered 2022-02-28: 500 [IU]

## 2022-02-28 NOTE — Patient Instructions (Signed)
[facility]   Discharge Instructions: Thank you for choosing [facility] to provide your oncology and hematology care.   If you have a lab appointment with the [facility], please go directly to the [facility] and check in at the registration area.   Wear comfortable clothing and clothing appropriate for easy access to any Portacath or PICC line.   We strive to give you quality time with your provider. You may need to reschedule your appointment if you arrive late (15 or more minutes).  Arriving late affects you and other patients whose appointments are after yours.  Also, if you miss three or more appointments without notifying the office, you may be dismissed from the clinic at the provider's discretion.      For prescription refill requests, have your pharmacy contact our office and allow 72 hours for refills to be completed.    Today you received the following chemotherapy and/or immunotherapy agents Abraxane, Gemzar      To help prevent nausea and vomiting after your treatment, we encourage you to take your nausea medication as directed.  BELOW ARE SYMPTOMS THAT SHOULD BE REPORTED IMMEDIATELY: *FEVER GREATER THAN 100.4 F (38 C) OR HIGHER *CHILLS OR SWEATING *NAUSEA AND VOMITING THAT IS NOT CONTROLLED WITH YOUR NAUSEA MEDICATION *UNUSUAL SHORTNESS OF BREATH *UNUSUAL BRUISING OR BLEEDING *URINARY PROBLEMS (pain or burning when urinating, or frequent urination) *BOWEL PROBLEMS (unusual diarrhea, constipation, pain near the anus) TENDERNESS IN MOUTH AND THROAT WITH OR WITHOUT PRESENCE OF ULCERS (sore throat, sores in mouth, or a toothache) UNUSUAL RASH, SWELLING OR PAIN  UNUSUAL VAGINAL DISCHARGE OR ITCHING   Items with * indicate a potential emergency and should be followed up as soon as possible or go to the Emergency Department if any problems should occur.  Please show the CHEMOTHERAPY ALERT CARD or IMMUNOTHERAPY ALERT CARD at  check-in to the Emergency Department and triage nurse.  Should you have questions after your visit or need to cancel or reschedule your appointment, please contact [facility]  Dept: [phone]  and follow the prompts.  Office hours are 8:00 a.m. to 4:30 p.m. Monday - Friday. Please note that voicemails left after 4:00 p.m. may not be returned until the following business day.  We are closed weekends and major holidays. You have access to a nurse at all times for urgent questions. Please call the main number to the clinic Dept: [phone] and follow the prompts.   For any non-urgent questions, you may also contact your provider using MyChart. We now offer e-Visits for anyone 23 and older to request care online for non-urgent symptoms. For details visit [email or web address].   Also download the MyChart app! Go to the app store, search "MyChart", open the app, select [facility], and log in with your MyChart username and password.  Masks are optional in the cancer centers. If you would like for your care team to wear a mask while they are taking care of you, please let them know. You may have one support person who is at least 56 years old accompany you for your appointments.  Paclitaxel Nanoparticle Albumin-Bound Injection What is this medication? NANOPARTICLE ALBUMIN-BOUND PACLITAXEL (Na no PAHR ti kuhl al BYOO muhn-bound PAK li TAX el) treats some types of cancer. It works by slowing down the growth of cancer cells. This medicine may be used for other purposes; ask your health care provider or pharmacist if you have questions. COMMON BRAND  NAME(S): Abraxane What should I tell my care team before I take this medication? They need to know if you have any of these conditions: Liver disease Low white blood cell levels An unusual or allergic reaction to paclitaxel, albumin, other medications, foods, dyes, or preservatives If you or your partner are pregnant or  trying to get pregnant Breast-feeding How should I use this medication? This medication is injected into a vein. It is given by your care team in a hospital or clinic setting. Talk to your care team about the use of this medication in children. Special care may be needed. Overdosage: If you think you have taken too much of this medicine contact a poison control center or emergency room at once. NOTE: This medicine is only for you. Do not share this medicine with others. What if I miss a dose? Keep appointments for follow-up doses. It is important not to miss your dose. Call your care team if you are unable to keep an appointment. What may interact with this medication? Other medications may affect the way this medication works. Talk with your care team about all of the medications you take. They may suggest changes to your treatment plan to lower the risk of side effects and to make sure your medications work as intended. This list may not describe all possible interactions. Give your health care provider a list of all the medicines, herbs, non-prescription drugs, or dietary supplements you use. Also tell them if you smoke, drink alcohol, or use illegal drugs. Some items may interact with your medicine. What should I watch for while using this medication? Your condition will be monitored carefully while you are receiving this medication. You may need blood work while taking this medication. This medication may make you feel generally unwell. This is not uncommon as chemotherapy can affect healthy cells as well as cancer cells. Report any side effects. Continue your course of treatment even though you feel ill unless your care team tells you to stop. This medication can cause serious allergic reactions. To reduce the risk, your care team may give you other medications to take before receiving this one. Be sure to follow the directions from your care team. This medication may increase your risk of  getting an infection. Call your care team for advice if you get a fever, chills, sore throat, or other symptoms of a cold or flu. Do not treat yourself. Try to avoid being around people who are sick. This medication may increase your risk to bruise or bleed. Call your care team if you notice any unusual bleeding. Be careful brushing or flossing your teeth or using a toothpick because you may get an infection or bleed more easily. If you have any dental work done, tell your dentist you are receiving this medication. Talk to your care team if you or your partner may be pregnant. Serious birth defects can occur if you take this medication during pregnancy and for 6 months after the last dose. You will need a negative pregnancy test before starting this medication. Contraception is recommended while taking this medication and for 6 months after the last dose. Your care team can help you find the option that works for you. If your partner can get pregnant, use a condom during sex while taking this medication and for 3 months after the last dose. Do not breastfeed while taking this medication and for 2 weeks after the last dose. This medication may cause infertility. Talk to your care team  if you are concerned about your fertility. What side effects may I notice from receiving this medication? Side effects that you should report to your care team as soon as possible: Allergic reactions--skin rash, itching, hives, swelling of the face, lips, tongue, or throat Dry cough, shortness of breath or trouble breathing Infection--fever, chills, cough, sore throat, wounds that don't heal, pain or trouble when passing urine, general feeling of discomfort or being unwell Low red blood cell level--unusual weakness or fatigue, dizziness, headache, trouble breathing Pain, tingling, or numbness in the hands or feet Stomach pain, unusual weakness or fatigue, nausea, vomiting, diarrhea, or fever that lasts longer than  expected Unusual bruising or bleeding Side effects that usually do not require medical attention (report to your care team if they continue or are bothersome): Diarrhea Fatigue Hair loss Loss of appetite Nausea Vomiting This list may not describe all possible side effects. Call your doctor for medical advice about side effects. You may report side effects to FDA at 1-800-FDA-1088. Where should I keep my medication? This medication is given in a hospital or clinic. It will not be stored at home. NOTE: This sheet is a summary. It may not cover all possible information. If you have questions about this medicine, talk to your doctor, pharmacist, or health care provider.  2023 Elsevier/Gold Standard (2021-09-26 00:00:00)  Gemcitabine Injection What is this medication? GEMCITABINE (jem SYE ta been) treats some types of cancer. It works by slowing down the growth of cancer cells. This medicine may be used for other purposes; ask your health care provider or pharmacist if you have questions. COMMON BRAND NAME(S): Gemzar, Infugem What should I tell my care team before I take this medication? They need to know if you have any of these conditions: Blood disorders Infection Kidney disease Liver disease Lung or breathing disease, such as asthma or COPD Recent or ongoing radiation therapy An unusual or allergic reaction to gemcitabine, other medications, foods, dyes, or preservatives If you or your partner are pregnant or trying to get pregnant Breast-feeding How should I use this medication? This medication is injected into a vein. It is given by your care team in a hospital or clinic setting. Talk to your care team about the use of this medication in children. Special care may be needed. Overdosage: If you think you have taken too much of this medicine contact a poison control center or emergency room at once. NOTE: This medicine is only for you. Do not share this medicine with others. What  if I miss a dose? Keep appointments for follow-up doses. It is important not to miss your dose. Call your care team if you are unable to keep an appointment. What may interact with this medication? Interactions have not been studied. This list may not describe all possible interactions. Give your health care provider a list of all the medicines, herbs, non-prescription drugs, or dietary supplements you use. Also tell them if you smoke, drink alcohol, or use illegal drugs. Some items may interact with your medicine. What should I watch for while using this medication? Your condition will be monitored carefully while you are receiving this medication. This medication may make you feel generally unwell. This is not uncommon, as chemotherapy can affect healthy cells as well as cancer cells. Report any side effects. Continue your course of treatment even though you feel ill unless your care team tells you to stop. In some cases, you may be given additional medications to help with side effects. Follow  all directions for their use. This medication may increase your risk of getting an infection. Call your care team for advice if you get a fever, chills, sore throat, or other symptoms of a cold or flu. Do not treat yourself. Try to avoid being around people who are sick. This medication may increase your risk to bruise or bleed. Call your care team if you notice any unusual bleeding. Be careful brushing or flossing your teeth or using a toothpick because you may get an infection or bleed more easily. If you have any dental work done, tell your dentist you are receiving this medication. Avoid taking medications that contain aspirin, acetaminophen, ibuprofen, naproxen, or ketoprofen unless instructed by your care team. These medications may hide a fever. Talk to your care team if you or your partner wish to become pregnant or think you might be pregnant. This medication can cause serious birth defects if taken  during pregnancy and for 6 months after the last dose. A negative pregnancy test is required before starting this medication. A reliable form of contraception is recommended while taking this medication and for 6 months after the last dose. Talk to your care team about effective forms of contraception. Do not father a child while taking this medication and for 3 months after the last dose. Use a condom while having sex during this time period. Do not breastfeed while taking this medication and for at least 1 week after the last dose. This medication may cause infertility. Talk to your care team if you are concerned about your fertility. What side effects may I notice from receiving this medication? Side effects that you should report to your care team as soon as possible: Allergic reactions--skin rash, itching, hives, swelling of the face, lips, tongue, or throat Capillary leak syndrome--stomach or muscle pain, unusual weakness or fatigue, feeling faint or lightheaded, decrease in the amount of urine, swelling of the ankles, hands, or feet, trouble breathing Infection--fever, chills, cough, sore throat, wounds that don't heal, pain or trouble when passing urine, general feeling of discomfort or being unwell Liver injury--right upper belly pain, loss of appetite, nausea, light-colored stool, dark yellow or brown urine, yellowing skin or eyes, unusual weakness or fatigue Low red blood cell level--unusual weakness or fatigue, dizziness, headache, trouble breathing Lung injury--shortness of breath or trouble breathing, cough, spitting up blood, chest pain, fever Stomach pain, bloody diarrhea, pale skin, unusual weakness or fatigue, decrease in the amount of urine, which may be signs of hemolytic uremic syndrome Sudden and severe headache, confusion, change in vision, seizures, which may be signs of posterior reversible encephalopathy syndrome (PRES) Unusual bruising or bleeding Side effects that usually do  not require medical attention (report to your care team if they continue or are bothersome): Diarrhea Drowsiness Hair loss Nausea Pain, redness, or swelling with sores inside the mouth or throat Vomiting This list may not describe all possible side effects. Call your doctor for medical advice about side effects. You may report side effects to FDA at 1-800-FDA-1088. Where should I keep my medication? This medication is given in a hospital or clinic. It will not be stored at home. NOTE: This sheet is a summary. It may not cover all possible information. If you have questions about this medicine, talk to your doctor, pharmacist, or health care provider.  2023 Elsevier/Gold Standard (2021-09-26 00:00:00)

## 2022-02-28 NOTE — Progress Notes (Signed)
[facility] OFFICE PROGRESS NOTE   Diagnosis:  Pancreas cancer  INTERVAL HISTORY:   Alyssa Ross returns as scheduled.  She completed another cycle of gemcitabine/Abraxane 02/14/2022.  Neuropathy symptoms overall remained stable.  No nausea or vomiting.  No mouth sores.  No diarrhea.  Objective:  Vital signs in last 24 hours:  Blood pressure 124/79, pulse 96, temperature 98.2 F (36.8 C), temperature source Oral, resp. rate 18, height 5' 7"  (1.702 m), weight 189 lb (85.7 kg), last menstrual period 09/17/2012, SpO2 100 %.    HEENT: No thrush or ulcers. Resp: Lungs clear bilaterally. Cardio: Regular rate and rhythm. GI: Abdomen soft and nontender.  No hepatosplenomegaly. Vascular: No leg edema. Skin: Hyperpigmented lesions scattered over the face. Port-A-Cath without  Lab Results:  Lab Results  Component Value Date   WBC 6.1 02/28/2022   HGB 9.0 (L) 02/28/2022   HCT 28.8 (L) 02/28/2022   MCV 91.4 02/28/2022   PLT 395 02/28/2022   NEUTROABS 4.1 02/28/2022    Imaging:  No results found.  Medications: I have reviewed the patient's current medications.  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 Tumor invades the duodenal wall and vascular groove, resection margins negative, 4/34 lymph nodes positive MSI-stable, tumor showed instability in 2 loci as did adjacent normal tissue Foundation 1-K-ras G12 V, microsatellite status and tumor mutation burden cannot be determined EUS FNA biopsy of pancreas mass on 07/03/2018-well-differentiated neuroendocrine tumor CTs 01/29/2019-ill-defined pancreas head mass, 5 pulmonary nodules-1 with a small amount of central cavitation, tumor abuts the left margin of the portal vein indistinct density surrounding, hepatic artery, complex cystic lesion of the right kidney, right adrenal mass-characterized as an adenoma on a [facility] MRI 12/21/2018 [facility] 03/03/2019-no focal pancreas  activity, no tracer accumulation within the suspicious pulmonary nodules, left uterine mass with tracer accumulation felt to represent a leiomyoma Elevated preoperative CA 19-9--CA 19-9 186 on 01/18/2019 CT chest 04/16/2019-multiple bilateral pulmonary nodules, some with increased cavitation, stable in size Cycle 1 FOLFIRINOX 04/27/2019 Cycle 2 FOLFIRINOX 05/11/2019 Cycle 3 FOLFIRINOX 05/23/2019 Cycle 4 FOLFIRINOX 06/08/2019 Cycle 5 FOLFIRINOX 06/22/2019 CT chest 07/02/2019-stable size of bilateral pulmonary nodules.  Dominant cavitary lesions in both lungs show increased cavitation with thinner walls.  Stable 2.1 cm right adrenal nodule. 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 No definite evidence of metastatic disease in the abdomen or pelvis. Markedly enlarged and heterogeneous appearing uterus, likely to represent multifocal fibroids. 1 of these lesions appears to be an exophytic subserosal fibroid in the posterior lateral aspect of the uterine body on the left side although this comes in close proximity to the left adnexa such that a primary ovarian lesion is difficult to completely exclude. CTs 02/07/2020-slight enlargement of bilateral lung nodules, some are cavitary, no evidence of metastatic disease in the abdomen or pelvis, stable right adrenal nodule, uterine fibroids CTs 04/26/2020-mild enlargement of pulmonary nodules, slight increase in trace pelvic fluid, new soft tissue thickening inferior to the cecal tip suspicious for peritoneal metastasis CT 05/26/2020-improved appearance  of soft tissue at the inferior tip of the cecum, mildly thickened short appendix-findings suggestive of resolving appendicitis, stable small bibasilar pulmonary nodules, fibroids  Plan biopsy of right cecal tip soft tissue canceled secondary to radiologic improvement CT chest 08/02/2020-enlargement and progressive cavitation of multiple bilateral lung nodules.  Some new nodules are present. CTs 10/24/2020- increase in size of pulmonary nodules, no new nodules, no evidence of metastatic disease in the abdomen, stable right adrenal nodule CT 01/09/2021-slight interval enlargement of pulmonary nodules, stable right adrenal nodule Navigation bronchoscopy 01/30/2021-left lower lobe cavitary nodule FNA-adenocarcinoma, brushing-adenocarcinoma.  Left lower lobe lavage-adenocarcinoma.  Right upper lobe brushing and FNA biopsy of cavitary nodule-adenocarcinoma-immunohistochemical profile consistent with pancreas adenocarcinoma Cycle 1 gemcitabine/Abraxane 03/28/2021 Cycle 2 gemcitabine/Abraxane 04/11/2021 Cycle 3 gemcitabine/Abraxane 04/25/2021 Cycle 4 gemcitabine/Abraxane 05/09/2021 Cycle 5 gemcitabine/Abraxane 05/23/2021 CT chest 06/05/2021-interval cavitation of multiple pulmonary nodules, some nodules have decreased in size, no new or enlarging nodules Cycle 6 gemcitabine/Abraxane 06/06/2021 Cycle 7 gemcitabine/Abraxane 06/21/2021 Cycle 8 gemcitabine/Abraxane 07/05/2021 Cycle 9 Gemcitabine/Abraxane 07/19/2021 Cycle 10 gemcitabine 08/01/2021-Abraxane held secondary to neuropathy CT chest 08/13/2021-mild decrease in size and wall thickness of multiple cavitary nodules, no new or progressive disease in the chest, indeterminate low-attenuation right liver lesions Cycle 11 gemcitabine 08/16/2021-Abraxane held secondary to neuropathy Cycle 12 gemcitabine 08/30/2021-Abraxane held secondary to neuropathy Cycle 13 gemcitabine 09/13/2021-Abraxane held secondary to neuropathy Cycle 14 gemcitabine 09/27/2021-Abraxane held secondary  to neuropathy Cycle 15 gemcitabine 10/11/2021-Abraxane held secondary to neuropathy CTs 10/22/2021-no change in multiple cavitary lung nodules, no evidence of disease progression, ill-defined hypodense lesion in the posterior right liver suspicious for metastatic disease Cycle 16 gemcitabine 10/25/2021 Cycle 17 gemcitabine 11/08/2021 Cycle 18 Gemcitabine 11/22/2021 Cycle 19 gemcitabine 12/06/2021 Cycle 20 Gemcitabine 12/20/2021 CT 12/31/2021-mild increase in size of bilateral pulmonary metastases, stable subtle continuation right liver lesions Cycle 20 gemcitabine/Abraxane 01/03/2022 Cycle 21 gemcitabine/Abraxane 01/17/2022 Cycle 22 gemcitabine/Abraxane 01/31/2022 Cycle 23 gemcitabine/Abraxane 02/14/2022 Cycle 24 gemcitabine/Abraxane 02/28/2022   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  She continues treatment with gemcitabine/Abraxane every 2 weeks.  Overall she is tolerating well.  Neuropathy symptoms are stable.  Most recent CA 19-9 was slightly higher.  We will follow-up on the value from today.  Plan to proceed with gemcitabine/Abraxane today as scheduled.  Restaging CTs prior to next office visit.  CBC and chemistry panel reviewed.  Labs adequate to proceed as above.  She will return for lab, follow-up, Gemcitabine/Abraxane in 2 weeks.  We are available to see her sooner if needed.    Ned Card ANP/GNP-BC   02/28/2022  11:14 AM

## 2022-02-28 NOTE — Progress Notes (Signed)
Patient seen by Lisa [name] NP today  Vitals are within treatment parameters.  Labs reviewed by Lisa [name] NP and are within treatment parameters.  Per physician team, patient is ready for treatment and there are NO modifications to the treatment plan.     

## 2022-02-28 NOTE — Telephone Encounter (Signed)
Alyssa Ross is schedule for a restaging CT Chest abd/Pelvis on 03/11/22 at [facility] 6 pm. The patient is aware on the appointment and to check in at the ER.

## 2022-03-01 LAB — CANCER ANTIGEN 19-9: CA 19-9: 68 U/mL — ABNORMAL HIGH (ref 0–35)

## 2022-03-05 DIAGNOSIS — E119 Type 2 diabetes mellitus without complications: Secondary | ICD-10-CM | POA: Diagnosis not present

## 2022-03-05 DIAGNOSIS — Z794 Long term (current) use of insulin: Secondary | ICD-10-CM | POA: Diagnosis not present

## 2022-03-05 LAB — HEMOGLOBIN A1C: Hemoglobin A1C: 9.4

## 2022-03-06 ENCOUNTER — Encounter: Payer: Self-pay | Admitting: Internal Medicine

## 2022-03-06 DIAGNOSIS — H43391 Other vitreous opacities, right eye: Secondary | ICD-10-CM | POA: Diagnosis not present

## 2022-03-06 DIAGNOSIS — E119 Type 2 diabetes mellitus without complications: Secondary | ICD-10-CM | POA: Diagnosis not present

## 2022-03-06 DIAGNOSIS — H2513 Age-related nuclear cataract, bilateral: Secondary | ICD-10-CM | POA: Diagnosis not present

## 2022-03-06 DIAGNOSIS — K769 Liver disease, unspecified: Secondary | ICD-10-CM | POA: Diagnosis not present

## 2022-03-06 LAB — HM DIABETES EYE EXAM

## 2022-03-08 ENCOUNTER — Encounter: Payer: Self-pay | Admitting: *Deleted

## 2022-03-08 ENCOUNTER — Other Ambulatory Visit: Payer: Self-pay | Admitting: *Deleted

## 2022-03-08 ENCOUNTER — Encounter: Payer: Self-pay | Admitting: Oncology

## 2022-03-08 DIAGNOSIS — C250 Malignant neoplasm of head of pancreas: Secondary | ICD-10-CM

## 2022-03-08 DIAGNOSIS — E876 Hypokalemia: Secondary | ICD-10-CM

## 2022-03-08 MED ORDER — POTASSIUM CHLORIDE CRYS ER 20 MEQ PO TBCR: 20.0000 meq | EXTENDED_RELEASE_TABLET | Freq: Two times a day (BID) | ORAL | 1 refills | Status: DC

## 2022-03-08 NOTE — Progress Notes (Signed)
Referral to [facility] faxed to [phone]

## 2022-03-08 NOTE — Telephone Encounter (Signed)
Patient sent MyChart message requesting refill on K+ and 90 day supply.

## 2022-03-10 ENCOUNTER — Other Ambulatory Visit: Payer: Self-pay | Admitting: Oncology

## 2022-03-11 ENCOUNTER — Ambulatory Visit (HOSPITAL_BASED_OUTPATIENT_CLINIC_OR_DEPARTMENT_OTHER)
Admission: RE | Admit: 2022-03-11 | Discharge: 2022-03-11 | Disposition: A | Discharge status: Home / Self Care | Payer: BC Managed Care – PPO | Source: Ambulatory Visit | Attending: Nurse Practitioner | Admitting: Nurse Practitioner

## 2022-03-11 DIAGNOSIS — N2889 Other specified disorders of kidney and ureter: Secondary | ICD-10-CM | POA: Diagnosis not present

## 2022-03-11 DIAGNOSIS — C250 Malignant neoplasm of head of pancreas: Secondary | ICD-10-CM | POA: Insufficient documentation

## 2022-03-11 DIAGNOSIS — R918 Other nonspecific abnormal finding of lung field: Secondary | ICD-10-CM | POA: Diagnosis not present

## 2022-03-11 DIAGNOSIS — C7800 Secondary malignant neoplasm of unspecified lung: Secondary | ICD-10-CM | POA: Diagnosis not present

## 2022-03-11 MED ORDER — IOHEXOL 300 MG/ML  SOLN
100.0000 mL | Freq: Once | INTRAMUSCULAR | Status: AC | PRN
  Administered 2022-03-11: 80 mL via INTRAVENOUS

## 2022-03-13 ENCOUNTER — Encounter: Payer: Self-pay | Admitting: Oncology

## 2022-03-14 ENCOUNTER — Encounter: Payer: Self-pay | Admitting: *Deleted

## 2022-03-14 ENCOUNTER — Inpatient Hospital Stay (HOSPITAL_BASED_OUTPATIENT_CLINIC_OR_DEPARTMENT_OTHER): Payer: BC Managed Care – PPO | Admitting: Nurse Practitioner

## 2022-03-14 ENCOUNTER — Inpatient Hospital Stay: Payer: BC Managed Care – PPO

## 2022-03-14 ENCOUNTER — Inpatient Hospital Stay: Payer: BC Managed Care – PPO | Attending: Genetic Counselor

## 2022-03-14 ENCOUNTER — Encounter: Payer: Self-pay | Admitting: Nurse Practitioner

## 2022-03-14 VITALS — BP 126/79 | HR 67

## 2022-03-14 VITALS — BP 123/81 | HR 75 | Temp 98.2°F | Resp 20 | Ht 67.0 in | Wt 188.6 lb

## 2022-03-14 DIAGNOSIS — Z5111 Encounter for antineoplastic chemotherapy: Secondary | ICD-10-CM | POA: Insufficient documentation

## 2022-03-14 DIAGNOSIS — Z85528 Personal history of other malignant neoplasm of kidney: Secondary | ICD-10-CM | POA: Insufficient documentation

## 2022-03-14 DIAGNOSIS — I10 Essential (primary) hypertension: Secondary | ICD-10-CM | POA: Diagnosis not present

## 2022-03-14 DIAGNOSIS — E1140 Type 2 diabetes mellitus with diabetic neuropathy, unspecified: Secondary | ICD-10-CM | POA: Diagnosis not present

## 2022-03-14 DIAGNOSIS — Z905 Acquired absence of kidney: Secondary | ICD-10-CM | POA: Insufficient documentation

## 2022-03-14 DIAGNOSIS — C250 Malignant neoplasm of head of pancreas: Secondary | ICD-10-CM

## 2022-03-14 DIAGNOSIS — C7801 Secondary malignant neoplasm of right lung: Secondary | ICD-10-CM | POA: Diagnosis not present

## 2022-03-14 DIAGNOSIS — C7802 Secondary malignant neoplasm of left lung: Secondary | ICD-10-CM | POA: Diagnosis not present

## 2022-03-14 DIAGNOSIS — Z79899 Other long term (current) drug therapy: Secondary | ICD-10-CM | POA: Diagnosis not present

## 2022-03-14 LAB — CBC WITH DIFFERENTIAL (CANCER CENTER ONLY)
Abs Immature Granulocytes: 0.01 10*3/uL (ref 0.00–0.07)
Basophils Absolute: 0 10*3/uL (ref 0.0–0.1)
Basophils Relative: 1 %
Eosinophils Absolute: 0.1 10*3/uL (ref 0.0–0.5)
Eosinophils Relative: 3 %
HCT: 27.4 % — ABNORMAL LOW (ref 36.0–46.0)
Hemoglobin: 8.4 g/dL — ABNORMAL LOW (ref 12.0–15.0)
Immature Granulocytes: 0 %
Lymphocytes Relative: 21 %
Lymphs Abs: 1.1 10*3/uL (ref 0.7–4.0)
MCH: 28.4 pg (ref 26.0–34.0)
MCHC: 30.7 g/dL (ref 30.0–36.0)
MCV: 92.6 fL (ref 80.0–100.0)
Monocytes Absolute: 0.8 10*3/uL (ref 0.1–1.0)
Monocytes Relative: 15 %
Neutro Abs: 3.1 10*3/uL (ref 1.7–7.7)
Neutrophils Relative %: 60 %
Platelet Count: 421 10*3/uL — ABNORMAL HIGH (ref 150–400)
RBC: 2.96 MIL/uL — ABNORMAL LOW (ref 3.87–5.11)
RDW: 16.4 % — ABNORMAL HIGH (ref 11.5–15.5)
WBC Count: 5.1 10*3/uL (ref 4.0–10.5)
nRBC: 0 % (ref 0.0–0.2)

## 2022-03-14 LAB — CMP (CANCER CENTER ONLY)
ALT: 18 U/L (ref 0–44)
AST: 28 U/L (ref 15–41)
Albumin: 3.3 g/dL — ABNORMAL LOW (ref 3.5–5.0)
Alkaline Phosphatase: 198 U/L — ABNORMAL HIGH (ref 38–126)
Anion gap: 8 (ref 5–15)
BUN: 7 mg/dL (ref 6–20)
CO2: 23 mmol/L (ref 22–32)
Calcium: 8.5 mg/dL — ABNORMAL LOW (ref 8.9–10.3)
Chloride: 109 mmol/L (ref 98–111)
Creatinine: 1.07 mg/dL — ABNORMAL HIGH (ref 0.44–1.00)
GFR, Estimated: >60 mL/min (ref >60)
Glucose, Bld: 177 mg/dL — ABNORMAL HIGH (ref 70–99)
Potassium: 3.4 mmol/L — ABNORMAL LOW (ref 3.5–5.1)
Sodium: 140 mmol/L (ref 135–145)
Total Bilirubin: 0.4 mg/dL (ref 0.3–1.2)
Total Protein: 7 g/dL (ref 6.5–8.1)

## 2022-03-14 MED ORDER — HEPARIN SOD (PORK) LOCK FLUSH 100 UNIT/ML IV SOLN
500.0000 [IU] | Freq: Once | INTRAVENOUS | Status: AC | PRN
  Administered 2022-03-14: 500 [IU]

## 2022-03-14 MED ORDER — SODIUM CHLORIDE 0.9% FLUSH
10.0000 mL | INTRAVENOUS | Status: DC | PRN
  Administered 2022-03-14: 10 mL

## 2022-03-14 MED ORDER — SODIUM CHLORIDE 0.9 % IV SOLN
2000.0000 mg | Freq: Once | INTRAVENOUS | Status: AC
  Administered 2022-03-14: 2000 mg via INTRAVENOUS
  Filled 2022-03-14: qty 52.6

## 2022-03-14 MED ORDER — PACLITAXEL PROTEIN-BOUND CHEMO INJECTION 100 MG
100.0000 mg/m2 | Freq: Once | INTRAVENOUS | Status: AC
  Administered 2022-03-14: 200 mg via INTRAVENOUS
  Filled 2022-03-14: qty 40

## 2022-03-14 MED ORDER — SODIUM CHLORIDE 0.9 % IV SOLN: Freq: Once | INTRAVENOUS | Status: AC

## 2022-03-14 MED ORDER — PROCHLORPERAZINE MALEATE 10 MG PO TABS
10.0000 mg | ORAL_TABLET | Freq: Once | ORAL | Status: AC
  Administered 2022-03-14: 10 mg via ORAL
  Filled 2022-03-14: qty 1

## 2022-03-14 NOTE — Progress Notes (Signed)
Patient seen by Lisa [name] NP today  Vitals are within treatment parameters.  Labs reviewed by Lisa [name] NP and are within treatment parameters.  Per physician team, patient is ready for treatment and there are NO modifications to the treatment plan.     

## 2022-03-14 NOTE — Progress Notes (Signed)
[facility] OFFICE PROGRESS NOTE   Diagnosis: Pancreas cancer  INTERVAL HISTORY:   Ms. Web returns as scheduled.  She completed another cycle of gemcitabine/Abraxane 02/28/2022.  She denies nausea/vomiting.  No mouth sores.  No diarrhea.  No change in neuropathy symptoms.  Objective:  Vital signs in last 24 hours:  Blood pressure 123/81, pulse 75, temperature 98.2 F (36.8 C), temperature source Oral, resp. rate 20, height _0  (1.702 m), weight 188 lb 9.6 oz (85.5 kg), last menstrual period 09/17/2012, SpO2 100 %.    HEENT: No thrush or ulcers. Resp: Lungs clear bilaterally. Cardio: Rate rate and rhythm. GI: Abdomen soft and nontender.  No hepatosplenomegaly. Vascular: No leg edema. Skin: Hyperpigmented skin lesions scattered over the face. Port-A-Cath without erythema.  Lab Results:  Lab Results  Component Value Date   WBC 5.1 03/14/2022   HGB 8.4 (L) 03/14/2022   HCT 27.4 (L) 03/14/2022   MCV 92.6 03/14/2022   PLT 421 (H) 03/14/2022   NEUTROABS 3.1 03/14/2022    Imaging:  No results found.  Medications: I have reviewed the patient's current medications.  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 Tumor invades the duodenal wall and vascular groove, resection margins negative, 4/34 lymph nodes positive MSI-stable, tumor showed instability in 2 loci as did adjacent normal tissue Foundation 1-K-ras G12 V, microsatellite status and tumor mutation burden cannot be determined EUS FNA biopsy of pancreas mass on 07/03/2018-well-differentiated neuroendocrine tumor CTs 01/29/2019-ill-defined pancreas head mass, 5 pulmonary nodules-1 with a small amount of central cavitation, tumor abuts the left margin of the portal vein indistinct density surrounding, hepatic artery, complex cystic lesion of the right kidney, right adrenal mass-characterized as an adenoma on a [facility] MRI 12/21/2018 [facility]  03/03/2019-no focal pancreas activity, no tracer accumulation within the suspicious pulmonary nodules, left uterine mass with tracer accumulation felt to represent a leiomyoma Elevated preoperative CA 19-9--CA 19-9 186 on 01/18/2019 CT chest 04/16/2019-multiple bilateral pulmonary nodules, some with increased cavitation, stable in size Cycle 1 FOLFIRINOX 04/27/2019 Cycle 2 FOLFIRINOX 05/11/2019 Cycle 3 FOLFIRINOX 05/23/2019 Cycle 4 FOLFIRINOX 06/08/2019 Cycle 5 FOLFIRINOX 06/22/2019 CT chest 07/02/2019-stable size of bilateral pulmonary nodules.  Dominant cavitary lesions in both lungs show increased cavitation with thinner walls.  Stable 2.1 cm right adrenal nodule. 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 No definite evidence of metastatic disease in the abdomen or pelvis. Markedly enlarged and heterogeneous appearing uterus, likely to represent multifocal fibroids. 1 of these lesions appears to be an exophytic subserosal fibroid in the posterior lateral aspect of the uterine body on the left side although this comes in close proximity to the left adnexa such that a primary ovarian lesion is difficult to completely exclude. CTs 02/07/2020-slight enlargement of bilateral lung nodules, some are cavitary, no evidence of metastatic disease in the abdomen or pelvis, stable right adrenal nodule, uterine fibroids CTs 04/26/2020-mild enlargement of pulmonary nodules, slight increase in trace pelvic fluid, new soft tissue thickening inferior to the cecal tip suspicious for peritoneal metastasis CT  05/26/2020-improved appearance of soft tissue at the inferior tip of the cecum, mildly thickened short appendix-findings suggestive of resolving appendicitis, stable small bibasilar  pulmonary nodules, fibroids Plan biopsy of right cecal tip soft tissue canceled secondary to radiologic improvement CT chest 08/02/2020-enlargement and progressive cavitation of multiple bilateral lung nodules.  Some new nodules are present. CTs 10/24/2020- increase in size of pulmonary nodules, no new nodules, no evidence of metastatic disease in the abdomen, stable right adrenal nodule CT 01/09/2021-slight interval enlargement of pulmonary nodules, stable right adrenal nodule Navigation bronchoscopy 01/30/2021-left lower lobe cavitary nodule FNA-adenocarcinoma, brushing-adenocarcinoma.  Left lower lobe lavage-adenocarcinoma.  Right upper lobe brushing and FNA biopsy of cavitary nodule-adenocarcinoma-immunohistochemical profile consistent with pancreas adenocarcinoma Cycle 1 gemcitabine/Abraxane 03/28/2021 Cycle 2 gemcitabine/Abraxane 04/11/2021 Cycle 3 gemcitabine/Abraxane 04/25/2021 Cycle 4 gemcitabine/Abraxane 05/09/2021 Cycle 5 gemcitabine/Abraxane 05/23/2021 CT chest 06/05/2021-interval cavitation of multiple pulmonary nodules, some nodules have decreased in size, no new or enlarging nodules Cycle 6 gemcitabine/Abraxane 06/06/2021 Cycle 7 gemcitabine/Abraxane 06/21/2021 Cycle 8 gemcitabine/Abraxane 07/05/2021 Cycle 9 Gemcitabine/Abraxane 07/19/2021 Cycle 10 gemcitabine 08/01/2021-Abraxane held secondary to neuropathy CT chest 08/13/2021-mild decrease in size and wall thickness of multiple cavitary nodules, no new or progressive disease in the chest, indeterminate low-attenuation right liver lesions Cycle 11 gemcitabine 08/16/2021-Abraxane held secondary to neuropathy Cycle 12 gemcitabine 08/30/2021-Abraxane held secondary to neuropathy Cycle 13 gemcitabine 09/13/2021-Abraxane held secondary to neuropathy Cycle 14 gemcitabine  09/27/2021-Abraxane held secondary to neuropathy Cycle 15 gemcitabine 10/11/2021-Abraxane held secondary to neuropathy CTs 10/22/2021-no change in multiple cavitary lung nodules, no evidence of disease progression, ill-defined hypodense lesion in the posterior right liver suspicious for metastatic disease Cycle 16 gemcitabine 10/25/2021 Cycle 17 gemcitabine 11/08/2021 Cycle 18 Gemcitabine 11/22/2021 Cycle 19 gemcitabine 12/06/2021 Cycle 20 Gemcitabine 12/20/2021 CT 12/31/2021-mild increase in size of bilateral pulmonary metastases, stable subtle continuation right liver lesions Cycle 20 gemcitabine/Abraxane 01/03/2022 Cycle 21 gemcitabine/Abraxane 01/17/2022 Cycle 22 gemcitabine/Abraxane 01/31/2022 Cycle 23 gemcitabine/Abraxane 02/14/2022 Cycle 24 gemcitabine/Abraxane 02/28/2022 CTs 03/11/2022-widespread metastatic disease to the lungs again noted with slight involution of several of the pulmonary nodules, no definite new nodules noted; interval cavitation of solid lesion in the posterior aspect right lobe of the liver, no new liver lesions noted. 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 appears stable.  She is on active treatment with gemcitabine/Abraxane every 2 weeks.  Recent restaging CTs show stable to improved disease.  Plan to continue gemcitabine/Abraxane, treatment today.  CBC and chemistry panel reviewed.  Labs adequate to proceed with  treatment.  She has mild hypokalemia.  She will continue the current potassium supplement.  Anemia is mildly progressed.  We will continue to monitor, red cell transfusion support as needed.  She has been referred to the [facility] for possible enrollment on a clinical trial.  She understands the [facility] will be contacting her with more information.  She will return for lab, follow-up, Gemcitabine/Abraxane in 2 weeks.  Patient seen with Dr. Benay Spice.  CT report/images reviewed on the computer with Ms. Doody.  Ned Card ANP/GNP-BC   03/14/2022  11:45 AM This was a shared visit with Ned Card.  We reviewed the CT findings and images with Ms. Treanor.  We discussed treatment options.  The CTs are consistent with stable to  slightly improved disease.  The CA 19-9 is stable.  I recommend continuing gemcitabine/Abraxane.  We referred her to [facility] at the [facility] to consider enrollment in an immunotherapy trial.  Discussed the general principles of the immunotherapy study.  She agrees to a consultation with Dr. Kateri Plummer.  I was present for greater than 50% of today's visit.  I performed medical decision making.  Julieanne Manson, MD

## 2022-03-14 NOTE — Progress Notes (Signed)
PATIENT NAVIGATOR PROGRESS NOTE  Name: Alyssa Ross Date: 03/14/2022 MRN: [id number]  DOB: June 04, 1966   Reason for visit:  [facility] information  Comments:  Most recent CT from 03/12/22 report faxed to [phone]    Time spent counseling/coordinating care: 15-30 minutes

## 2022-03-14 NOTE — Patient Instructions (Signed)
[facility]   Discharge Instructions: Thank you for choosing [facility] to provide your oncology and hematology care.   If you have a lab appointment with the [facility], please go directly to the [facility] and check in at the registration area.   Wear comfortable clothing and clothing appropriate for easy access to any Portacath or PICC line.   We strive to give you quality time with your provider. You may need to reschedule your appointment if you arrive late (15 or more minutes).  Arriving late affects you and other patients whose appointments are after yours.  Also, if you miss three or more appointments without notifying the office, you may be dismissed from the clinic at the provider's discretion.      For prescription refill requests, have your pharmacy contact our office and allow 72 hours for refills to be completed.    Today you received the following chemotherapy and/or immunotherapy agents Abraxane, Gemzar      To help prevent nausea and vomiting after your treatment, we encourage you to take your nausea medication as directed.  BELOW ARE SYMPTOMS THAT SHOULD BE REPORTED IMMEDIATELY: *FEVER GREATER THAN 100.4 F (38 C) OR HIGHER *CHILLS OR SWEATING *NAUSEA AND VOMITING THAT IS NOT CONTROLLED WITH YOUR NAUSEA MEDICATION *UNUSUAL SHORTNESS OF BREATH *UNUSUAL BRUISING OR BLEEDING *URINARY PROBLEMS (pain or burning when urinating, or frequent urination) *BOWEL PROBLEMS (unusual diarrhea, constipation, pain near the anus) TENDERNESS IN MOUTH AND THROAT WITH OR WITHOUT PRESENCE OF ULCERS (sore throat, sores in mouth, or a toothache) UNUSUAL RASH, SWELLING OR PAIN  UNUSUAL VAGINAL DISCHARGE OR ITCHING   Items with * indicate a potential emergency and should be followed up as soon as possible or go to the Emergency Department if any problems should occur.  Please show the CHEMOTHERAPY ALERT CARD or IMMUNOTHERAPY ALERT CARD at  check-in to the Emergency Department and triage nurse.  Should you have questions after your visit or need to cancel or reschedule your appointment, please contact [facility]  Dept: [phone]  and follow the prompts.  Office hours are 8:00 a.m. to 4:30 p.m. Monday - Friday. Please note that voicemails left after 4:00 p.m. may not be returned until the following business day.  We are closed weekends and major holidays. You have access to a nurse at all times for urgent questions. Please call the main number to the clinic Dept: [phone] and follow the prompts.   For any non-urgent questions, you may also contact your provider using MyChart. We now offer e-Visits for anyone 21 and older to request care online for non-urgent symptoms. For details visit [email or web address].   Also download the MyChart app! Go to the app store, search "MyChart", open the app, select [facility], and log in with your MyChart username and password.  Masks are optional in the cancer centers. If you would like for your care team to wear a mask while they are taking care of you, please let them know. You may have one support person who is at least 56 years old accompany you for your appointments.  Paclitaxel Nanoparticle Albumin-Bound Injection What is this medication? NANOPARTICLE ALBUMIN-BOUND PACLITAXEL (Na no PAHR ti kuhl al BYOO muhn-bound PAK li TAX el) treats some types of cancer. It works by slowing down the growth of cancer cells. This medicine may be used for other purposes; ask your health care provider or pharmacist if you have questions. COMMON BRAND  NAME(S): Abraxane What should I tell my care team before I take this medication? They need to know if you have any of these conditions: Liver disease Low white blood cell levels An unusual or allergic reaction to paclitaxel, albumin, other medications, foods, dyes, or preservatives If you or your partner are pregnant or  trying to get pregnant Breast-feeding How should I use this medication? This medication is injected into a vein. It is given by your care team in a hospital or clinic setting. Talk to your care team about the use of this medication in children. Special care may be needed. Overdosage: If you think you have taken too much of this medicine contact a poison control center or emergency room at once. NOTE: This medicine is only for you. Do not share this medicine with others. What if I miss a dose? Keep appointments for follow-up doses. It is important not to miss your dose. Call your care team if you are unable to keep an appointment. What may interact with this medication? Other medications may affect the way this medication works. Talk with your care team about all of the medications you take. They may suggest changes to your treatment plan to lower the risk of side effects and to make sure your medications work as intended. This list may not describe all possible interactions. Give your health care provider a list of all the medicines, herbs, non-prescription drugs, or dietary supplements you use. Also tell them if you smoke, drink alcohol, or use illegal drugs. Some items may interact with your medicine. What should I watch for while using this medication? Your condition will be monitored carefully while you are receiving this medication. You may need blood work while taking this medication. This medication may make you feel generally unwell. This is not uncommon as chemotherapy can affect healthy cells as well as cancer cells. Report any side effects. Continue your course of treatment even though you feel ill unless your care team tells you to stop. This medication can cause serious allergic reactions. To reduce the risk, your care team may give you other medications to take before receiving this one. Be sure to follow the directions from your care team. This medication may increase your risk of  getting an infection. Call your care team for advice if you get a fever, chills, sore throat, or other symptoms of a cold or flu. Do not treat yourself. Try to avoid being around people who are sick. This medication may increase your risk to bruise or bleed. Call your care team if you notice any unusual bleeding. Be careful brushing or flossing your teeth or using a toothpick because you may get an infection or bleed more easily. If you have any dental work done, tell your dentist you are receiving this medication. Talk to your care team if you or your partner may be pregnant. Serious birth defects can occur if you take this medication during pregnancy and for 6 months after the last dose. You will need a negative pregnancy test before starting this medication. Contraception is recommended while taking this medication and for 6 months after the last dose. Your care team can help you find the option that works for you. If your partner can get pregnant, use a condom during sex while taking this medication and for 3 months after the last dose. Do not breastfeed while taking this medication and for 2 weeks after the last dose. This medication may cause infertility. Talk to your care team  if you are concerned about your fertility. What side effects may I notice from receiving this medication? Side effects that you should report to your care team as soon as possible: Allergic reactions--skin rash, itching, hives, swelling of the face, lips, tongue, or throat Dry cough, shortness of breath or trouble breathing Infection--fever, chills, cough, sore throat, wounds that don't heal, pain or trouble when passing urine, general feeling of discomfort or being unwell Low red blood cell level--unusual weakness or fatigue, dizziness, headache, trouble breathing Pain, tingling, or numbness in the hands or feet Stomach pain, unusual weakness or fatigue, nausea, vomiting, diarrhea, or fever that lasts longer than  expected Unusual bruising or bleeding Side effects that usually do not require medical attention (report to your care team if they continue or are bothersome): Diarrhea Fatigue Hair loss Loss of appetite Nausea Vomiting This list may not describe all possible side effects. Call your doctor for medical advice about side effects. You may report side effects to FDA at 1-800-FDA-1088. Where should I keep my medication? This medication is given in a hospital or clinic. It will not be stored at home. NOTE: This sheet is a summary. It may not cover all possible information. If you have questions about this medicine, talk to your doctor, pharmacist, or health care provider.  2023 Elsevier/Gold Standard (2021-09-26 00:00:00)  Gemcitabine Injection What is this medication? GEMCITABINE (jem SYE ta been) treats some types of cancer. It works by slowing down the growth of cancer cells. This medicine may be used for other purposes; ask your health care provider or pharmacist if you have questions. COMMON BRAND NAME(S): Gemzar, Infugem What should I tell my care team before I take this medication? They need to know if you have any of these conditions: Blood disorders Infection Kidney disease Liver disease Lung or breathing disease, such as asthma or COPD Recent or ongoing radiation therapy An unusual or allergic reaction to gemcitabine, other medications, foods, dyes, or preservatives If you or your partner are pregnant or trying to get pregnant Breast-feeding How should I use this medication? This medication is injected into a vein. It is given by your care team in a hospital or clinic setting. Talk to your care team about the use of this medication in children. Special care may be needed. Overdosage: If you think you have taken too much of this medicine contact a poison control center or emergency room at once. NOTE: This medicine is only for you. Do not share this medicine with others. What  if I miss a dose? Keep appointments for follow-up doses. It is important not to miss your dose. Call your care team if you are unable to keep an appointment. What may interact with this medication? Interactions have not been studied. This list may not describe all possible interactions. Give your health care provider a list of all the medicines, herbs, non-prescription drugs, or dietary supplements you use. Also tell them if you smoke, drink alcohol, or use illegal drugs. Some items may interact with your medicine. What should I watch for while using this medication? Your condition will be monitored carefully while you are receiving this medication. This medication may make you feel generally unwell. This is not uncommon, as chemotherapy can affect healthy cells as well as cancer cells. Report any side effects. Continue your course of treatment even though you feel ill unless your care team tells you to stop. In some cases, you may be given additional medications to help with side effects. Follow  all directions for their use. This medication may increase your risk of getting an infection. Call your care team for advice if you get a fever, chills, sore throat, or other symptoms of a cold or flu. Do not treat yourself. Try to avoid being around people who are sick. This medication may increase your risk to bruise or bleed. Call your care team if you notice any unusual bleeding. Be careful brushing or flossing your teeth or using a toothpick because you may get an infection or bleed more easily. If you have any dental work done, tell your dentist you are receiving this medication. Avoid taking medications that contain aspirin, acetaminophen, ibuprofen, naproxen, or ketoprofen unless instructed by your care team. These medications may hide a fever. Talk to your care team if you or your partner wish to become pregnant or think you might be pregnant. This medication can cause serious birth defects if taken  during pregnancy and for 6 months after the last dose. A negative pregnancy test is required before starting this medication. A reliable form of contraception is recommended while taking this medication and for 6 months after the last dose. Talk to your care team about effective forms of contraception. Do not father a child while taking this medication and for 3 months after the last dose. Use a condom while having sex during this time period. Do not breastfeed while taking this medication and for at least 1 week after the last dose. This medication may cause infertility. Talk to your care team if you are concerned about your fertility. What side effects may I notice from receiving this medication? Side effects that you should report to your care team as soon as possible: Allergic reactions--skin rash, itching, hives, swelling of the face, lips, tongue, or throat Capillary leak syndrome--stomach or muscle pain, unusual weakness or fatigue, feeling faint or lightheaded, decrease in the amount of urine, swelling of the ankles, hands, or feet, trouble breathing Infection--fever, chills, cough, sore throat, wounds that don't heal, pain or trouble when passing urine, general feeling of discomfort or being unwell Liver injury--right upper belly pain, loss of appetite, nausea, light-colored stool, dark yellow or brown urine, yellowing skin or eyes, unusual weakness or fatigue Low red blood cell level--unusual weakness or fatigue, dizziness, headache, trouble breathing Lung injury--shortness of breath or trouble breathing, cough, spitting up blood, chest pain, fever Stomach pain, bloody diarrhea, pale skin, unusual weakness or fatigue, decrease in the amount of urine, which may be signs of hemolytic uremic syndrome Sudden and severe headache, confusion, change in vision, seizures, which may be signs of posterior reversible encephalopathy syndrome (PRES) Unusual bruising or bleeding Side effects that usually do  not require medical attention (report to your care team if they continue or are bothersome): Diarrhea Drowsiness Hair loss Nausea Pain, redness, or swelling with sores inside the mouth or throat Vomiting This list may not describe all possible side effects. Call your doctor for medical advice about side effects. You may report side effects to FDA at 1-800-FDA-1088. Where should I keep my medication? This medication is given in a hospital or clinic. It will not be stored at home. NOTE: This sheet is a summary. It may not cover all possible information. If you have questions about this medicine, talk to your doctor, pharmacist, or health care provider.  2023 Elsevier/Gold Standard (2021-09-26 00:00:00)

## 2022-03-15 ENCOUNTER — Other Ambulatory Visit: Payer: Self-pay | Admitting: *Deleted

## 2022-03-15 DIAGNOSIS — C250 Malignant neoplasm of head of pancreas: Secondary | ICD-10-CM

## 2022-03-16 LAB — CANCER ANTIGEN 19-9: CA 19-9: 61 U/mL — ABNORMAL HIGH (ref 0–35)

## 2022-03-20 DIAGNOSIS — Z713 Dietary counseling and surveillance: Secondary | ICD-10-CM | POA: Diagnosis not present

## 2022-03-21 DIAGNOSIS — Z01419 Encounter for gynecological examination (general) (routine) without abnormal findings: Secondary | ICD-10-CM | POA: Diagnosis not present

## 2022-03-21 DIAGNOSIS — Z780 Asymptomatic menopausal state: Secondary | ICD-10-CM | POA: Diagnosis not present

## 2022-03-21 DIAGNOSIS — R87810 Cervical high risk human papillomavirus (HPV) DNA test positive: Secondary | ICD-10-CM | POA: Diagnosis not present

## 2022-03-21 DIAGNOSIS — Z6830 Body mass index (BMI) 30.0-30.9, adult: Secondary | ICD-10-CM | POA: Diagnosis not present

## 2022-03-21 DIAGNOSIS — Z1211 Encounter for screening for malignant neoplasm of colon: Secondary | ICD-10-CM | POA: Diagnosis not present

## 2022-03-23 ENCOUNTER — Other Ambulatory Visit: Payer: Self-pay | Admitting: Oncology

## 2022-03-26 DIAGNOSIS — R87810 Cervical high risk human papillomavirus (HPV) DNA test positive: Secondary | ICD-10-CM | POA: Diagnosis not present

## 2022-03-28 ENCOUNTER — Inpatient Hospital Stay: Payer: BC Managed Care – PPO

## 2022-03-28 ENCOUNTER — Encounter: Payer: Self-pay | Admitting: Nurse Practitioner

## 2022-03-28 ENCOUNTER — Inpatient Hospital Stay (HOSPITAL_BASED_OUTPATIENT_CLINIC_OR_DEPARTMENT_OTHER): Payer: BC Managed Care – PPO | Admitting: Nurse Practitioner

## 2022-03-28 VITALS — BP 129/77 | HR 84 | Temp 98.1°F | Resp 18 | Ht 67.0 in | Wt 189.9 lb

## 2022-03-28 VITALS — BP 123/84 | HR 75

## 2022-03-28 DIAGNOSIS — C250 Malignant neoplasm of head of pancreas: Secondary | ICD-10-CM

## 2022-03-28 DIAGNOSIS — Z85528 Personal history of other malignant neoplasm of kidney: Secondary | ICD-10-CM | POA: Diagnosis not present

## 2022-03-28 DIAGNOSIS — Z905 Acquired absence of kidney: Secondary | ICD-10-CM | POA: Diagnosis not present

## 2022-03-28 DIAGNOSIS — C7802 Secondary malignant neoplasm of left lung: Secondary | ICD-10-CM | POA: Diagnosis not present

## 2022-03-28 DIAGNOSIS — I10 Essential (primary) hypertension: Secondary | ICD-10-CM | POA: Diagnosis not present

## 2022-03-28 DIAGNOSIS — Z5111 Encounter for antineoplastic chemotherapy: Secondary | ICD-10-CM | POA: Diagnosis not present

## 2022-03-28 DIAGNOSIS — Z79899 Other long term (current) drug therapy: Secondary | ICD-10-CM | POA: Diagnosis not present

## 2022-03-28 DIAGNOSIS — E1140 Type 2 diabetes mellitus with diabetic neuropathy, unspecified: Secondary | ICD-10-CM | POA: Diagnosis not present

## 2022-03-28 DIAGNOSIS — C7801 Secondary malignant neoplasm of right lung: Secondary | ICD-10-CM | POA: Diagnosis not present

## 2022-03-28 LAB — CMP (CANCER CENTER ONLY)
ALT: 22 U/L (ref 0–44)
AST: 24 U/L (ref 15–41)
Albumin: 3.3 g/dL — ABNORMAL LOW (ref 3.5–5.0)
Alkaline Phosphatase: 188 U/L — ABNORMAL HIGH (ref 38–126)
Anion gap: 7 (ref 5–15)
BUN: 7 mg/dL (ref 6–20)
CO2: 22 mmol/L (ref 22–32)
Calcium: 8.5 mg/dL — ABNORMAL LOW (ref 8.9–10.3)
Chloride: 110 mmol/L (ref 98–111)
Creatinine: 1.1 mg/dL — ABNORMAL HIGH (ref 0.44–1.00)
GFR, Estimated: 59 mL/min — ABNORMAL LOW (ref >60)
Glucose, Bld: 268 mg/dL — ABNORMAL HIGH (ref 70–99)
Potassium: 3.6 mmol/L (ref 3.5–5.1)
Sodium: 139 mmol/L (ref 135–145)
Total Bilirubin: 0.4 mg/dL (ref 0.3–1.2)
Total Protein: 6.8 g/dL (ref 6.5–8.1)

## 2022-03-28 LAB — CBC WITH DIFFERENTIAL (CANCER CENTER ONLY)
Abs Immature Granulocytes: 0.01 10*3/uL (ref 0.00–0.07)
Basophils Absolute: 0 10*3/uL (ref 0.0–0.1)
Basophils Relative: 1 %
Eosinophils Absolute: 0.1 10*3/uL (ref 0.0–0.5)
Eosinophils Relative: 3 %
HCT: 27.1 % — ABNORMAL LOW (ref 36.0–46.0)
Hemoglobin: 8.5 g/dL — ABNORMAL LOW (ref 12.0–15.0)
Immature Granulocytes: 0 %
Lymphocytes Relative: 20 %
Lymphs Abs: 0.9 10*3/uL (ref 0.7–4.0)
MCH: 28.8 pg (ref 26.0–34.0)
MCHC: 31.4 g/dL (ref 30.0–36.0)
MCV: 91.9 fL (ref 80.0–100.0)
Monocytes Absolute: 0.6 10*3/uL (ref 0.1–1.0)
Monocytes Relative: 13 %
Neutro Abs: 2.9 10*3/uL (ref 1.7–7.7)
Neutrophils Relative %: 63 %
Platelet Count: 304 10*3/uL (ref 150–400)
RBC: 2.95 MIL/uL — ABNORMAL LOW (ref 3.87–5.11)
RDW: 16.7 % — ABNORMAL HIGH (ref 11.5–15.5)
WBC Count: 4.6 10*3/uL (ref 4.0–10.5)
nRBC: 0 % (ref 0.0–0.2)

## 2022-03-28 LAB — MAGNESIUM: Magnesium: 1.6 mg/dL — ABNORMAL LOW (ref 1.7–2.4)

## 2022-03-28 LAB — SAMPLE TO BLOOD BANK

## 2022-03-28 MED ORDER — SODIUM CHLORIDE 0.9 % IV SOLN: Freq: Once | INTRAVENOUS | Status: AC

## 2022-03-28 MED ORDER — PACLITAXEL PROTEIN-BOUND CHEMO INJECTION 100 MG
100.0000 mg/m2 | Freq: Once | INTRAVENOUS | Status: AC
  Administered 2022-03-28: 200 mg via INTRAVENOUS
  Filled 2022-03-28: qty 40

## 2022-03-28 MED ORDER — LIDOCAINE-PRILOCAINE 2.5-2.5 % EX CREA: 1.0000 | TOPICAL_CREAM | CUTANEOUS | 5 refills | Status: DC

## 2022-03-28 MED ORDER — SODIUM CHLORIDE 0.9% FLUSH
10.0000 mL | INTRAVENOUS | Status: DC | PRN
  Administered 2022-03-28: 10 mL

## 2022-03-28 MED ORDER — SODIUM CHLORIDE 0.9 % IV SOLN
2000.0000 mg | Freq: Once | INTRAVENOUS | Status: AC
  Administered 2022-03-28: 2000 mg via INTRAVENOUS
  Filled 2022-03-28: qty 52.6

## 2022-03-28 MED ORDER — HEPARIN SOD (PORK) LOCK FLUSH 100 UNIT/ML IV SOLN
500.0000 [IU] | Freq: Once | INTRAVENOUS | Status: AC | PRN
  Administered 2022-03-28: 500 [IU]

## 2022-03-28 MED ORDER — PROCHLORPERAZINE MALEATE 10 MG PO TABS
10.0000 mg | ORAL_TABLET | Freq: Once | ORAL | Status: AC
  Administered 2022-03-28: 10 mg via ORAL
  Filled 2022-03-28: qty 1

## 2022-03-28 NOTE — Progress Notes (Signed)
[facility] OFFICE PROGRESS NOTE   Diagnosis: Pancreas cancer  INTERVAL HISTORY:   Alyssa Ross returns as scheduled.  She completed another cycle of gemcitabine/Abraxane 03/14/2022.  No significant nausea/vomiting.  Periodic diarrhea.  No mouth sores.  Stable neuropathy symptoms.  Skin rash slowly improving.  Objective:  Vital signs in last 24 hours:  Blood pressure 129/77, pulse 84, temperature 98.1 F (36.7 C), temperature source Oral, resp. rate 18, height 5' 7"  (1.702 m), weight 189 lb 14.4 oz (86.1 kg), last menstrual period 09/17/2012, SpO2 100 %.    HEENT: No thrush or ulcers. Resp: Lungs clear bilaterally. Cardio: Regular rate and rhythm. GI: Abdomen soft and nontender.  No hepatosplenomegaly. Vascular: No leg edema. Neuro: Alert and oriented.  Follows commands.  Gait normal. Skin: Hyperpigmented skin lesions scattered over the face. Port-A-Cath without erythema.   Lab Results:  Lab Results  Component Value Date   WBC 4.6 03/28/2022   HGB 8.5 (L) 03/28/2022   HCT 27.1 (L) 03/28/2022   MCV 91.9 03/28/2022   PLT 304 03/28/2022   NEUTROABS 2.9 03/28/2022    Imaging:  No results found.  Medications: I have reviewed the patient's current medications.  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 Tumor invades the duodenal wall and vascular groove, resection margins negative, 4/34 lymph nodes positive MSI-stable, tumor showed instability in 2 loci as did adjacent normal tissue Foundation 1-K-ras G12 V, microsatellite status and tumor mutation burden cannot be determined EUS FNA biopsy of pancreas mass on 07/03/2018-well-differentiated neuroendocrine tumor CTs 01/29/2019-ill-defined pancreas head mass, 5 pulmonary nodules-1 with a small amount of central cavitation, tumor abuts the left margin of the portal vein indistinct density surrounding, hepatic artery, complex cystic lesion of the right  kidney, right adrenal mass-characterized as an adenoma on a [facility] MRI 12/21/2018 [facility] 03/03/2019-no focal pancreas activity, no tracer accumulation within the suspicious pulmonary nodules, left uterine mass with tracer accumulation felt to represent a leiomyoma Elevated preoperative CA 19-9--CA 19-9 186 on 01/18/2019 CT chest 04/16/2019-multiple bilateral pulmonary nodules, some with increased cavitation, stable in size Cycle 1 FOLFIRINOX 04/27/2019 Cycle 2 FOLFIRINOX 05/11/2019 Cycle 3 FOLFIRINOX 05/23/2019 Cycle 4 FOLFIRINOX 06/08/2019 Cycle 5 FOLFIRINOX 06/22/2019 CT chest 07/02/2019-stable size of bilateral pulmonary nodules.  Dominant cavitary lesions in both lungs show increased cavitation with thinner walls.  Stable 2.1 cm right adrenal nodule. 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 No definite evidence of metastatic disease in the abdomen or pelvis. Markedly enlarged and heterogeneous appearing uterus, likely to represent multifocal fibroids. 1 of these lesions appears to be an exophytic subserosal fibroid in the posterior lateral aspect of the uterine body on the left side although this comes in close proximity to the left adnexa such that a primary ovarian lesion is difficult to completely exclude. CTs 02/07/2020-slight enlargement of bilateral lung nodules, some are cavitary, no evidence of metastatic disease in the abdomen or pelvis, stable right adrenal nodule, uterine fibroids CTs 04/26/2020-mild enlargement of pulmonary nodules, slight increase in trace pelvic fluid, new  soft tissue thickening inferior to the cecal tip suspicious for peritoneal metastasis CT 05/26/2020-improved appearance of soft tissue at the inferior tip of  the cecum, mildly thickened short appendix-findings suggestive of resolving appendicitis, stable small bibasilar pulmonary nodules, fibroids Plan biopsy of right cecal tip soft tissue canceled secondary to radiologic improvement CT chest 08/02/2020-enlargement and progressive cavitation of multiple bilateral lung nodules.  Some new nodules are present. CTs 10/24/2020- increase in size of pulmonary nodules, no new nodules, no evidence of metastatic disease in the abdomen, stable right adrenal nodule CT 01/09/2021-slight interval enlargement of pulmonary nodules, stable right adrenal nodule Navigation bronchoscopy 01/30/2021-left lower lobe cavitary nodule FNA-adenocarcinoma, brushing-adenocarcinoma.  Left lower lobe lavage-adenocarcinoma.  Right upper lobe brushing and FNA biopsy of cavitary nodule-adenocarcinoma-immunohistochemical profile consistent with pancreas adenocarcinoma Cycle 1 gemcitabine/Abraxane 03/28/2021 Cycle 2 gemcitabine/Abraxane 04/11/2021 Cycle 3 gemcitabine/Abraxane 04/25/2021 Cycle 4 gemcitabine/Abraxane 05/09/2021 Cycle 5 gemcitabine/Abraxane 05/23/2021 CT chest 06/05/2021-interval cavitation of multiple pulmonary nodules, some nodules have decreased in size, no new or enlarging nodules Cycle 6 gemcitabine/Abraxane 06/06/2021 Cycle 7 gemcitabine/Abraxane 06/21/2021 Cycle 8 gemcitabine/Abraxane 07/05/2021 Cycle 9 Gemcitabine/Abraxane 07/19/2021 Cycle 10 gemcitabine 08/01/2021-Abraxane held secondary to neuropathy CT chest 08/13/2021-mild decrease in size and wall thickness of multiple cavitary nodules, no new or progressive disease in the chest, indeterminate low-attenuation right liver lesions Cycle 11 gemcitabine 08/16/2021-Abraxane held secondary to neuropathy Cycle 12 gemcitabine 08/30/2021-Abraxane held secondary to  neuropathy Cycle 13 gemcitabine 09/13/2021-Abraxane held secondary to neuropathy Cycle 14 gemcitabine 09/27/2021-Abraxane held secondary to neuropathy Cycle 15 gemcitabine 10/11/2021-Abraxane held secondary to neuropathy CTs 10/22/2021-no change in multiple cavitary lung nodules, no evidence of disease progression, ill-defined hypodense lesion in the posterior right liver suspicious for metastatic disease Cycle 16 gemcitabine 10/25/2021 Cycle 17 gemcitabine 11/08/2021 Cycle 18 Gemcitabine 11/22/2021 Cycle 19 gemcitabine 12/06/2021 Cycle 20 Gemcitabine 12/20/2021 CT 12/31/2021-mild increase in size of bilateral pulmonary metastases, stable subtle continuation right liver lesions Cycle 20 gemcitabine/Abraxane 01/03/2022 Cycle 21 gemcitabine/Abraxane 01/17/2022 Cycle 22 gemcitabine/Abraxane 01/31/2022 Cycle 23 gemcitabine/Abraxane 02/14/2022 Cycle 24 gemcitabine/Abraxane 02/28/2022 CTs 03/11/2022-widespread metastatic disease to the lungs again noted with slight involution of several of the pulmonary nodules, no definite new nodules noted; interval cavitation of solid lesion in the posterior aspect right lobe of the liver, no new liver lesions noted. 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  She is on active treatment with gemcitabine/Abraxane every 2 weeks.  There is no clinical evidence of disease  progression.  Most recent CA 19-9 tumor marker was further improved.  Plan to continue treatment as above, cycle 27 today.  CBC and chemistry panel reviewed.  Labs adequate to proceed as above.  She is interested in learning more about the trial at the [facility].  We are contacting them today.  She will return for lab, follow-up, Gemcitabine/Abraxane in 2 weeks.  We are available to see her sooner if needed.    Ned Card ANP/GNP-BC   03/28/2022  10:58 AM

## 2022-03-28 NOTE — Progress Notes (Signed)
Patient seen by Ned Card NP today  Vitals are within treatment parameters.  Labs reviewed by Ned Card NP and are within treatment parameters. Per Ned Card, NP ok to treat with Magnesium 1.6 mg/dL today.  Per physician team, patient is ready for treatment and there are NO modifications to the treatment plan.

## 2022-03-28 NOTE — Patient Instructions (Signed)
[facility]   Discharge Instructions: Thank you for choosing [facility] to provide your oncology and hematology care.   If you have a lab appointment with the [facility], please go directly to the [facility] and check in at the registration area.   Wear comfortable clothing and clothing appropriate for easy access to any Portacath or PICC line.   We strive to give you quality time with your provider. You may need to reschedule your appointment if you arrive late (15 or more minutes).  Arriving late affects you and other patients whose appointments are after yours.  Also, if you miss three or more appointments without notifying the office, you may be dismissed from the clinic at the provider's discretion.      For prescription refill requests, have your pharmacy contact our office and allow 72 hours for refills to be completed.    Today you received the following chemotherapy and/or immunotherapy agents Abraxane, Gemzar      To help prevent nausea and vomiting after your treatment, we encourage you to take your nausea medication as directed.  BELOW ARE SYMPTOMS THAT SHOULD BE REPORTED IMMEDIATELY: *FEVER GREATER THAN 100.4 F (38 C) OR HIGHER *CHILLS OR SWEATING *NAUSEA AND VOMITING THAT IS NOT CONTROLLED WITH YOUR NAUSEA MEDICATION *UNUSUAL SHORTNESS OF BREATH *UNUSUAL BRUISING OR BLEEDING *URINARY PROBLEMS (pain or burning when urinating, or frequent urination) *BOWEL PROBLEMS (unusual diarrhea, constipation, pain near the anus) TENDERNESS IN MOUTH AND THROAT WITH OR WITHOUT PRESENCE OF ULCERS (sore throat, sores in mouth, or a toothache) UNUSUAL RASH, SWELLING OR PAIN  UNUSUAL VAGINAL DISCHARGE OR ITCHING   Items with * indicate a potential emergency and should be followed up as soon as possible or go to the Emergency Department if any problems should occur.  Please show the CHEMOTHERAPY ALERT CARD or IMMUNOTHERAPY ALERT CARD at  check-in to the Emergency Department and triage nurse.  Should you have questions after your visit or need to cancel or reschedule your appointment, please contact [facility]  Dept: [phone]  and follow the prompts.  Office hours are 8:00 a.m. to 4:30 p.m. Monday - Friday. Please note that voicemails left after 4:00 p.m. may not be returned until the following business day.  We are closed weekends and major holidays. You have access to a nurse at all times for urgent questions. Please call the main number to the clinic Dept: [phone] and follow the prompts.   For any non-urgent questions, you may also contact your provider using MyChart. We now offer e-Visits for anyone 40 and older to request care online for non-urgent symptoms. For details visit [email or web address].   Also download the MyChart app! Go to the app store, search "MyChart", open the app, select [facility], and log in with your MyChart username and password.  Masks are optional in the cancer centers. If you would like for your care team to wear a mask while they are taking care of you, please let them know. You may have one support person who is at least 56 years old accompany you for your appointments.  Paclitaxel Nanoparticle Albumin-Bound Injection What is this medication? NANOPARTICLE ALBUMIN-BOUND PACLITAXEL (Na no PAHR ti kuhl al BYOO muhn-bound PAK li TAX el) treats some types of cancer. It works by slowing down the growth of cancer cells. This medicine may be used for other purposes; ask your health care provider or pharmacist if you have questions. COMMON BRAND  NAME(S): Abraxane What should I tell my care team before I take this medication? They need to know if you have any of these conditions: Liver disease Low white blood cell levels An unusual or allergic reaction to paclitaxel, albumin, other medications, foods, dyes, or preservatives If you or your partner are pregnant or  trying to get pregnant Breast-feeding How should I use this medication? This medication is injected into a vein. It is given by your care team in a hospital or clinic setting. Talk to your care team about the use of this medication in children. Special care may be needed. Overdosage: If you think you have taken too much of this medicine contact a poison control center or emergency room at once. NOTE: This medicine is only for you. Do not share this medicine with others. What if I miss a dose? Keep appointments for follow-up doses. It is important not to miss your dose. Call your care team if you are unable to keep an appointment. What may interact with this medication? Other medications may affect the way this medication works. Talk with your care team about all of the medications you take. They may suggest changes to your treatment plan to lower the risk of side effects and to make sure your medications work as intended. This list may not describe all possible interactions. Give your health care provider a list of all the medicines, herbs, non-prescription drugs, or dietary supplements you use. Also tell them if you smoke, drink alcohol, or use illegal drugs. Some items may interact with your medicine. What should I watch for while using this medication? Your condition will be monitored carefully while you are receiving this medication. You may need blood work while taking this medication. This medication may make you feel generally unwell. This is not uncommon as chemotherapy can affect healthy cells as well as cancer cells. Report any side effects. Continue your course of treatment even though you feel ill unless your care team tells you to stop. This medication can cause serious allergic reactions. To reduce the risk, your care team may give you other medications to take before receiving this one. Be sure to follow the directions from your care team. This medication may increase your risk of  getting an infection. Call your care team for advice if you get a fever, chills, sore throat, or other symptoms of a cold or flu. Do not treat yourself. Try to avoid being around people who are sick. This medication may increase your risk to bruise or bleed. Call your care team if you notice any unusual bleeding. Be careful brushing or flossing your teeth or using a toothpick because you may get an infection or bleed more easily. If you have any dental work done, tell your dentist you are receiving this medication. Talk to your care team if you or your partner may be pregnant. Serious birth defects can occur if you take this medication during pregnancy and for 6 months after the last dose. You will need a negative pregnancy test before starting this medication. Contraception is recommended while taking this medication and for 6 months after the last dose. Your care team can help you find the option that works for you. If your partner can get pregnant, use a condom during sex while taking this medication and for 3 months after the last dose. Do not breastfeed while taking this medication and for 2 weeks after the last dose. This medication may cause infertility. Talk to your care team  if you are concerned about your fertility. What side effects may I notice from receiving this medication? Side effects that you should report to your care team as soon as possible: Allergic reactions--skin rash, itching, hives, swelling of the face, lips, tongue, or throat Dry cough, shortness of breath or trouble breathing Infection--fever, chills, cough, sore throat, wounds that don't heal, pain or trouble when passing urine, general feeling of discomfort or being unwell Low red blood cell level--unusual weakness or fatigue, dizziness, headache, trouble breathing Pain, tingling, or numbness in the hands or feet Stomach pain, unusual weakness or fatigue, nausea, vomiting, diarrhea, or fever that lasts longer than  expected Unusual bruising or bleeding Side effects that usually do not require medical attention (report to your care team if they continue or are bothersome): Diarrhea Fatigue Hair loss Loss of appetite Nausea Vomiting This list may not describe all possible side effects. Call your doctor for medical advice about side effects. You may report side effects to FDA at 1-800-FDA-1088. Where should I keep my medication? This medication is given in a hospital or clinic. It will not be stored at home. NOTE: This sheet is a summary. It may not cover all possible information. If you have questions about this medicine, talk to your doctor, pharmacist, or health care provider.  2023 Elsevier/Gold Standard (2021-09-26 00:00:00)  Gemcitabine Injection What is this medication? GEMCITABINE (jem SYE ta been) treats some types of cancer. It works by slowing down the growth of cancer cells. This medicine may be used for other purposes; ask your health care provider or pharmacist if you have questions. COMMON BRAND NAME(S): Gemzar, Infugem What should I tell my care team before I take this medication? They need to know if you have any of these conditions: Blood disorders Infection Kidney disease Liver disease Lung or breathing disease, such as asthma or COPD Recent or ongoing radiation therapy An unusual or allergic reaction to gemcitabine, other medications, foods, dyes, or preservatives If you or your partner are pregnant or trying to get pregnant Breast-feeding How should I use this medication? This medication is injected into a vein. It is given by your care team in a hospital or clinic setting. Talk to your care team about the use of this medication in children. Special care may be needed. Overdosage: If you think you have taken too much of this medicine contact a poison control center or emergency room at once. NOTE: This medicine is only for you. Do not share this medicine with others. What  if I miss a dose? Keep appointments for follow-up doses. It is important not to miss your dose. Call your care team if you are unable to keep an appointment. What may interact with this medication? Interactions have not been studied. This list may not describe all possible interactions. Give your health care provider a list of all the medicines, herbs, non-prescription drugs, or dietary supplements you use. Also tell them if you smoke, drink alcohol, or use illegal drugs. Some items may interact with your medicine. What should I watch for while using this medication? Your condition will be monitored carefully while you are receiving this medication. This medication may make you feel generally unwell. This is not uncommon, as chemotherapy can affect healthy cells as well as cancer cells. Report any side effects. Continue your course of treatment even though you feel ill unless your care team tells you to stop. In some cases, you may be given additional medications to help with side effects. Follow  all directions for their use. This medication may increase your risk of getting an infection. Call your care team for advice if you get a fever, chills, sore throat, or other symptoms of a cold or flu. Do not treat yourself. Try to avoid being around people who are sick. This medication may increase your risk to bruise or bleed. Call your care team if you notice any unusual bleeding. Be careful brushing or flossing your teeth or using a toothpick because you may get an infection or bleed more easily. If you have any dental work done, tell your dentist you are receiving this medication. Avoid taking medications that contain aspirin, acetaminophen, ibuprofen, naproxen, or ketoprofen unless instructed by your care team. These medications may hide a fever. Talk to your care team if you or your partner wish to become pregnant or think you might be pregnant. This medication can cause serious birth defects if taken  during pregnancy and for 6 months after the last dose. A negative pregnancy test is required before starting this medication. A reliable form of contraception is recommended while taking this medication and for 6 months after the last dose. Talk to your care team about effective forms of contraception. Do not father a child while taking this medication and for 3 months after the last dose. Use a condom while having sex during this time period. Do not breastfeed while taking this medication and for at least 1 week after the last dose. This medication may cause infertility. Talk to your care team if you are concerned about your fertility. What side effects may I notice from receiving this medication? Side effects that you should report to your care team as soon as possible: Allergic reactions--skin rash, itching, hives, swelling of the face, lips, tongue, or throat Capillary leak syndrome--stomach or muscle pain, unusual weakness or fatigue, feeling faint or lightheaded, decrease in the amount of urine, swelling of the ankles, hands, or feet, trouble breathing Infection--fever, chills, cough, sore throat, wounds that don't heal, pain or trouble when passing urine, general feeling of discomfort or being unwell Liver injury--right upper belly pain, loss of appetite, nausea, light-colored stool, dark yellow or brown urine, yellowing skin or eyes, unusual weakness or fatigue Low red blood cell level--unusual weakness or fatigue, dizziness, headache, trouble breathing Lung injury--shortness of breath or trouble breathing, cough, spitting up blood, chest pain, fever Stomach pain, bloody diarrhea, pale skin, unusual weakness or fatigue, decrease in the amount of urine, which may be signs of hemolytic uremic syndrome Sudden and severe headache, confusion, change in vision, seizures, which may be signs of posterior reversible encephalopathy syndrome (PRES) Unusual bruising or bleeding Side effects that usually do  not require medical attention (report to your care team if they continue or are bothersome): Diarrhea Drowsiness Hair loss Nausea Pain, redness, or swelling with sores inside the mouth or throat Vomiting This list may not describe all possible side effects. Call your doctor for medical advice about side effects. You may report side effects to FDA at 1-800-FDA-1088. Where should I keep my medication? This medication is given in a hospital or clinic. It will not be stored at home. NOTE: This sheet is a summary. It may not cover all possible information. If you have questions about this medicine, talk to your doctor, pharmacist, or health care provider.  2023 Elsevier/Gold Standard (2021-09-26 00:00:00)

## 2022-03-28 NOTE — Patient Instructions (Signed)

## 2022-03-30 LAB — CANCER ANTIGEN 19-9: CA 19-9: 71 U/mL — ABNORMAL HIGH (ref 0–35)

## 2022-04-05 ENCOUNTER — Telehealth: Payer: Self-pay

## 2022-04-05 NOTE — Patient Outreach (Signed)
  Care Coordination   Initial Visit Note   04/05/2022 Name: Alyssa Ross MRN: [id number] DOB: February 27, 1966  Alyssa Ross is a 55 y.o. year old female who sees Glendale Chard, MD for primary care. I spoke with  [name] by phone today.  What matters to the patients health and wellness today?  Patient reports she is doing well. Denies any RN CM/THN needs or concerns at this time.     Goals Addressed             This Visit's Progress    COMPLETED: Care Coordination-no follow up required       Care Coordination Interventions: Advised patient to complete immunizations, annual physical done on 08/20/21 Provided education to patient re: [facility] services Assessed social determinant of health barriers          SDOH assessments and interventions completed:  Yes  SDOH Interventions Today    Flowsheet Row Most Recent Value  SDOH Interventions   Food Insecurity Interventions Intervention Not Indicated  Transportation Interventions Intervention Not Indicated        Care Coordination Interventions Activated:  Yes  Care Coordination Interventions:  Yes, provided   Follow up plan: No further intervention required.   Encounter Outcome:  Pt. Visit Completed   Enzo Montgomery, RN,BSN,CCM [facility] Telephonic Care Management Coordinator Direct Phone: [phone] Toll Free: [phone] Fax: [phone]

## 2022-04-07 ENCOUNTER — Other Ambulatory Visit: Payer: Self-pay | Admitting: Oncology

## 2022-04-07 DIAGNOSIS — C250 Malignant neoplasm of head of pancreas: Secondary | ICD-10-CM

## 2022-04-10 ENCOUNTER — Encounter: Payer: Self-pay | Admitting: Internal Medicine

## 2022-04-10 ENCOUNTER — Telehealth (INDEPENDENT_AMBULATORY_CARE_PROVIDER_SITE_OTHER): Payer: BC Managed Care – PPO | Admitting: Internal Medicine

## 2022-04-10 ENCOUNTER — Encounter: Payer: Self-pay | Admitting: Oncology

## 2022-04-10 ENCOUNTER — Encounter: Payer: Self-pay | Admitting: *Deleted

## 2022-04-10 VITALS — Wt 189.0 lb

## 2022-04-10 DIAGNOSIS — U071 COVID-19: Secondary | ICD-10-CM

## 2022-04-10 DIAGNOSIS — R0981 Nasal congestion: Secondary | ICD-10-CM | POA: Diagnosis not present

## 2022-04-10 LAB — POC COVID19 BINAXNOW: SARS Coronavirus 2 Ag: POSITIVE — AB

## 2022-04-10 MED ORDER — PAXLOVID (150/100) 10 X 150 MG & 10 X 100MG PO TBPK: ORAL_TABLET | ORAL | 0 refills | Status: DC

## 2022-04-10 NOTE — Progress Notes (Signed)
MyChart message sent by Alyssa Ross to cancel her treatment tomorrow due to testing positive for COVID today. She will come at next scheduled chemo and plans to reach out to PCP today as well.

## 2022-04-10 NOTE — Patient Instructions (Signed)
COVID-19 COVID-19, or coronavirus disease 2019, is an infection that is caused by a new (novel) coronavirus called SARS-CoV-2. COVID-19 can cause many symptoms. In some people, the virus may not cause any symptoms. In others, it may cause mild or severe symptoms. Some people with severe infection develop severe disease. What are the causes? This illness is caused by a virus. The virus may be in the air as tiny specks of fluid (aerosols) or droplets, or it may be on surfaces. You may catch the virus by: Breathing in droplets from an infected person. Droplets can be spread by a person breathing, speaking, singing, coughing, or sneezing. Touching something, like a table or a doorknob, that has virus on it (is contaminated) and then touching your mouth, nose, or eyes. What increases the risk? Risk for infection: You are more likely to get infected with the COVID-19 virus if: You are within 6 ft (1.8 m) of a person with COVID-19 for 15 minutes or longer. You are providing care for a person who is infected with COVID-19. You are in close personal contact with other people. Close personal contact includes hugging, kissing, or sharing eating or drinking utensils. Risk for serious illness caused by COVID-19: You are more likely to get seriously ill from the COVID-19 virus if: You have cancer. You have a long-term (chronic) disease, such as: Chronic lung disease. This includes pulmonary embolism, chronic obstructive pulmonary disease, and cystic fibrosis. Long-term disease that lowers your body's ability to fight infection (immunocompromise). Serious cardiac conditions, such as heart failure, coronary artery disease, or cardiomyopathy. Diabetes. Chronic kidney disease. Liver diseases. These include cirrhosis, nonalcoholic fatty liver disease, alcoholic liver disease, or autoimmune hepatitis. You have obesity. You are pregnant or were recently pregnant. You have sickle cell disease. What are the signs  or symptoms? Symptoms of this condition can range from mild to severe. Symptoms may appear any time from 2 to 14 days after being exposed to the virus. They include: Fever or chills. Shortness of breath or trouble breathing. Feeling tired or very tired. Headaches, body aches, or muscle aches. Runny or stuffy nose, sneezing, coughing, or sore throat. New loss of taste or smell. This is rare. Some people may also have stomach problems, such as nausea, vomiting, or diarrhea. Other people may not have any symptoms of COVID-19. How is this diagnosed? This condition may be diagnosed by testing samples to check for the COVID-19 virus. The most common tests are the PCR test and the antigen test. Tests may be done in the lab or at home. They include: Using a swab to take a sample of fluid from the back of your nose and throat (nasopharyngeal fluid), from your nose, or from your throat. Testing a sample of saliva from your mouth. Testing a sample of coughed-up mucus from your lungs (sputum). How is this treated? Treatment for COVID-19 infection depends on the severity of the condition. Mild symptoms can be managed at home with rest, fluids, and over-the-counter medicines. Serious symptoms may be treated in a hospital intensive care unit (ICU). Treatment in the ICU may include: Supplemental oxygen. Extra oxygen is given through a tube in the nose, a face mask, or a hood. Medicines. These may include: Antivirals, such as monoclonal antibodies. These help your body fight off certain viruses that can cause disease. Anti-inflammatories, such as corticosteroids. These reduce inflammation and suppress the immune system. Antithrombotics. These prevent or treat blood clots, if they develop. Convalescent plasma. This helps boost your immune system, if   you have an underlying immunosuppressive condition or are getting immunosuppressive treatments. Prone positioning. This means you will lie on your stomach. This  helps oxygen to get into your lungs. Infection control measures. If you are at risk for more serious illness caused by COVID-19, your health care provider may prescribe two long-acting monoclonal antibodies, given together every 6 months. How is this prevented? To protect yourself: Use preventive medicine (pre-exposure prophylaxis). You may get pre-exposure prophylaxis if you have moderate or severe immunocompromise. Get vaccinated. Anyone 6 months old or older who meets guidelines can get a COVID-19 vaccine or vaccine series. This includes people who are pregnant or making breast milk (lactating). Get an added dose of COVID-19 vaccine after your first vaccine or vaccine series if you have moderate to severe immunocompromise. This applies if you have had a solid organ transplant or have been diagnosed with an immunocompromising condition. You should get the added dose 4 weeks after you got the first COVID-19 vaccine or vaccine series. If you get an mRNA vaccine, you will need a 3-dose primary series. If you get the J&J/Janssen vaccine, you will need a 2-dose primary series, with the second dose being an mRNA vaccine. Talk to your health care provider about getting experimental monoclonal antibodies. This treatment is approved under emergency use authorization to prevent severe illness before or after being exposed to the COVID-19 virus. You may be given monoclonal antibodies if: You have moderate or severe immunocompromise. This includes treatments that lower your immune response. People with immunocompromise may not develop protection against COVID-19 when they are vaccinated. You cannot be vaccinated. You may not get a vaccine if you have a severe allergic reaction to the vaccine or its components. You are not fully vaccinated. You are in a facility where COVID-19 is present and: Are in close contact with a person who is infected with the COVID-19 virus. Are at high risk of being exposed to the  COVID-19 virus. You are at risk of illness from new variants of the COVID-19 virus. To protect others: If you have symptoms of COVID-19, take steps to prevent the virus from spreading to others. Stay home. Leave your house only to get medical care. Do not use public transit, if possible. Do not travel while you are sick. Wash your hands often with soap and water for at least 20 seconds. If soap and water are not available, use alcohol-based hand sanitizer. Make sure that all people in your household wash their hands well and often. Cough or sneeze into a tissue or your sleeve or elbow. Do not cough or sneeze into your hand or into the air. Where to find more information Centers for Disease Control and Prevention: www.cdc.gov/coronavirus World Health Organization: www.who.int/health-topics/coronavirus Get help right away if: You have trouble breathing. You have pain or pressure in your chest. You are confused. You have bluish lips and fingernails. You have trouble waking from sleep. You have symptoms that get worse. These symptoms may be an emergency. Get help right away. Call 911. Do not wait to see if the symptoms will go away. Do not drive yourself to the hospital. Summary COVID-19 is an infection that is caused by a new coronavirus. Sometimes, there are no symptoms. Other times, symptoms range from mild to severe. Some people with a severe COVID-19 infection develop severe disease. The virus that causes COVID-19 can spread from person to person through droplets or aerosols from breathing, speaking, singing, coughing, or sneezing. Mild symptoms of COVID-19 can be managed   at home with rest, fluids, and over-the-counter medicines. This information is not intended to replace advice given to you by your health care provider. Make sure you discuss any questions you have with your health care provider. Document Revised: 05/15/2021 Document Reviewed: 05/17/2021 Elsevier Patient Education   2023 Elsevier Inc.  

## 2022-04-10 NOTE — Progress Notes (Signed)
Virtual Visit via Video   This visit type was conducted due to national recommendations for restrictions regarding the COVID-19 Pandemic (e.g. social distancing) in an effort to limit this patient's exposure and mitigate transmission in our community.  Due to her co-morbid illnesses, this patient is at least at moderate risk for complications without adequate follow up.  This format is felt to be most appropriate for this patient at this time.  All issues noted in this document were discussed and addressed.  A limited physical exam was performed with this format.    This visit type was conducted due to national recommendations for restrictions regarding the COVID-19 Pandemic (e.g. social distancing) in an effort to limit this patient's exposure and mitigate transmission in our community.  Patients identity confirmed using two different identifiers.  This format is felt to be most appropriate for this patient at this time.  All issues noted in this document were discussed and addressed.  No physical exam was performed (except for noted visual exam findings with Video Visits).    Date:  04/16/2022   ID:  Alyssa Ross, DOB Mar 20, 1966, MRN [id number]  Patient Location:  Home  Provider location:   Office    Chief Complaint:  "I have sinus congestion"  History of Present Illness:    Alyssa Ross is a 56 y.o. female who presents via video conferencing for a telehealth visit today.    The patient does have symptoms concerning for COVID-19 infection (fever, chills, cough, or new shortness of breath).   She presents today for virtual visit. She prefers this method of contact due to COVID-19 pandemic.  She presents today for further evaluation of rhinorrhea and sinus congestion. She denies having any fever/chills and body aches. She does admit to having a headache, but states it can be attributed to other reasons. She did do a home COVID test, which is positive. However, the test is expired. She  recently received her "government kits".  She does wish to take antiviral therapy.       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   Lung nodule    pancreatic ca dx'd 11/2018   Neuroendocrine Tumor of the pancreas   PONV (postoperative nausea and vomiting)    nausea  vomitting after 12/25/18 ERCP   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   Yeast vaginitis 2006   Past Surgical History:  Procedure Laterality Date   BILIARY STENT PLACEMENT N/A 12/25/2018   Procedure: BILIARY STENT PLACEMENT;  Surgeon: Carol Ada, MD;  Location: [facility];  Service: Endoscopy;  Laterality: N/A;   BILIARY STENT PLACEMENT N/A 01/01/2019   Procedure: BILIARY STENT PLACEMENT;  Surgeon: Carol Ada, MD;  Location: [facility];  Service: Endoscopy;  Laterality: N/A;   BRONCHIAL BIOPSY  01/30/2021   Procedure: BRONCHIAL BIOPSIES;  Surgeon: Garner Nash, DO;  Location: [facility];  Service: Pulmonary;;   BRONCHIAL BRUSHINGS  01/30/2021   Procedure: BRONCHIAL BRUSHINGS;  Surgeon: Garner Nash, DO;  Location: [facility];  Service: Pulmonary;;   BRONCHIAL NEEDLE ASPIRATION BIOPSY  01/30/2021  Procedure: BRONCHIAL NEEDLE ASPIRATION BIOPSIES;  Surgeon: Garner Nash, DO;  Location: [facility];  Service: Pulmonary;;   BRONCHIAL WASHINGS  01/30/2021   Procedure: BRONCHIAL WASHINGS;  Surgeon: Garner Nash, DO;  Location: [facility];  Service: Pulmonary;;   DILATATION & CURETTAGE/HYSTEROSCOPY WITH TRUECLEAR N/A 01/20/2014   Procedure: DILATATION & CURETTAGE/HYSTEROSCOPY WITH TRUCLEAR;  Surgeon: [name], MD;  Location: [facility];  Service: Gynecology;  Laterality: N/A;   DILATATION & CURRETTAGE/HYSTEROSCOPY WITH RESECTOCOPE N/A 01/20/2014   Procedure: DILATATION & CURETTAGE/HYSTEROSCOPY WITH RESECTOCOPE;  Surgeon: [name], MD;  Location: [facility];  Service: Gynecology;  Laterality: N/A;   ENDOSCOPIC RETROGRADE CHOLANGIOPANCREATOGRAPHY (ERCP) WITH PROPOFOL N/A 12/25/2018   Procedure: ENDOSCOPIC RETROGRADE CHOLANGIOPANCREATOGRAPHY (ERCP) WITH PROPOFOL;  Surgeon: Carol Ada, MD;  [facility];  Service: Endoscopy;  Laterality: N/A;   ENDOSCOPIC RETROGRADE CHOLANGIOPANCREATOGRAPHY (ERCP) WITH PROPOFOL N/A 01/01/2019   Procedure: ENDOSCOPIC RETROGRADE CHOLANGIOPANCREATOGRAPHY (ERCP) WITH PROPOFOL;  Surgeon: Carol Ada, MD;  [facility]: [facility];  Service: Endoscopy;  Laterality: N/A;   ERCP  12/25/2018   FINE NEEDLE ASPIRATION N/A 01/01/2019   Procedure: FINE NEEDLE ASPIRATION (FNA) LINEAR;  Surgeon: Carol Ada, MD;  [facility]: [facility];  Service: Endoscopy;  Laterality: N/A;   LAPAROSCOPY N/A 03/04/2019   Procedure: LAPAROSCOPY DIAGNOSTIC;  Surgeon: Stark Klein, MD;  Location: [facility];  Service: General;  Laterality: N/A;  GENERAL AND EPIDURAL   PARTIAL NEPHRECTOMY Right 03/04/2019   Procedure: Nephrectomy Partial;  Surgeon: Cleon Gustin, MD;  Location: [place];  Service: Urology;  Laterality: Right;   PORTACATH PLACEMENT Left 04/21/2019   Procedure: INSERTION PORT-A-CATH;  Surgeon: Stark Klein, MD;  Location: [facility];  Service: General;  Laterality: Left;   SPHINCTEROTOMY  12/25/2018   Procedure: Joan Mayans;  Surgeon: Carol Ada, MD;  [facility];  Service: Endoscopy;;   STENT REMOVAL  01/01/2019   Procedure: STENT REMOVAL;  Surgeon: Carol Ada, MD;  [facility];  Service: Endoscopy;;   UPPER ESOPHAGEAL ENDOSCOPIC ULTRASOUND (EUS) N/A 01/01/2019   Procedure: UPPER ESOPHAGEAL ENDOSCOPIC ULTRASOUND (EUS);  Surgeon: Carol Ada, MD;  Location: [facility];  Service: Endoscopy;  Laterality: N/A;   VIDEO BRONCHOSCOPY WITH ENDOBRONCHIAL NAVIGATION Bilateral 01/30/2021   Procedure: VIDEO BRONCHOSCOPY WITH ENDOBRONCHIAL NAVIGATION;  Surgeon: Garner Nash, DO;  Location: [facility];  Service: Pulmonary;  Laterality: Bilateral;  ION   VIDEO BRONCHOSCOPY WITH RADIAL ENDOBRONCHIAL ULTRASOUND  01/30/2021   Procedure: RADIAL ENDOBRONCHIAL ULTRASOUND;  Surgeon: Garner Nash, DO;  Location: [facility];  Service: Pulmonary;;   WHIPPLE PROCEDURE N/A 03/04/2019   Procedure: WHIPPLE PROCEDURE;  Surgeon: Stark Klein, MD;  Location: [facility];  Service: General;  Laterality: N/A;  GENERAL AND EPIDURAL     Current Meds  Medication Sig   Continuous Blood Gluc Receiver (FREESTYLE LIBRE 14 DAY READER) DEVI USE READER TO CHECK BLOOD GLUCOSE WITH FREESTYLE LIBRE SENSORS.   Continuous Blood Gluc Sensor (FREESTYLE LIBRE 2 SENSOR) MISC APPLY EVERY 14 DAYS   glucose blood test strip Check sugar 2 times daily.   Insulin Degludec (TRESIBA FLEXTOUCH ) Inject into the skin. 12-18 Units   insulin lispro (HUMALOG KWIKPEN) 200 UNIT/ML KwikPen 6 units with breakfast, 13 units with lunch, 16 units with dinner   Insulin Pen Needle (BD PEN NEEDLE NANO U/F) 32G X 4 MM MISC USE TO INJECT INSULIN THREE TIMES A DAY   lidocaine-prilocaine (EMLA) cream Apply 1 Application topically as directed. Apply 1 hour prior to stick and cover with plastic  wrap   loratadine (CLARITIN) 10 MG tablet Take 10 mg by mouth at bedtime.    magic mouthwash SOLN Take 5 mLs by mouth 4 (four) times daily as needed for mouth pain.   MAGnesium-Oxide 400 (240 Mg) MG tablet Take 1 tablet (400 mg total) by mouth 2 (two) times daily.   nirmatrelvir & ritonavir (PAXLOVID, 150/100,) 10 x 150 MG & 10 x 100MG TBPK Take 2 tabs twice daily   potassium chloride SA (KLOR-CON M) 20 MEQ tablet Take 1 tablet (20 mEq total) by mouth 2 (two) times daily.   prochlorperazine (COMPAZINE)  10 MG tablet TAKE 1 TABLET(10 MG) BY MOUTH EVERY 6 HOURS AS NEEDED FOR NAUSEA OR VOMITING   rosuvastatin (CRESTOR) 10 MG tablet Take 1 tablet (10 mg total) by mouth at bedtime.   spironolactone (ALDACTONE) 25 MG tablet Take 1 tablet (25 mg total) by mouth daily.   valsartan (DIOVAN) 160 MG tablet One tab po twice daily     Allergies:   Cherry and Lemon oil   Social History   Tobacco Use   Smoking status: Never   Smokeless tobacco: Never  Vaping Use   Vaping Use: Never used  Substance Use Topics   Alcohol use: Not Currently    Comment: rarely   Drug use: No     Family Hx: The patient's family history includes Cancer in her cousin and paternal aunt; Cancer (age of onset: 43) in her cousin; Cancer (age of onset: 8) in her cousin; Cancer (age of onset: 60) in her cousin; Dementia in her mother; Diabetes in her father, mother, and sister; Down syndrome in her sister; Goiter in her cousin and maternal aunt; Heart Problems in her brother; Hyperlipidemia in her brother, father, and mother; Hypertension in her father and mother; Irregular heart beat in her mother; Pancreatic cancer (age of onset: 60) in her cousin; Thyroid nodules in her sister.  ROS:   Please see the history of present illness.    Review of Systems  Constitutional:  Positive for malaise/fatigue.  HENT:  Positive for congestion.   Respiratory: Negative.    Cardiovascular: Negative.   Gastrointestinal: Negative.   Neurological: Negative.   Psychiatric/Behavioral: Negative.      All other systems reviewed and are negative.   Labs/Other Tests and Data Reviewed:    Recent Labs: 03/28/2022: ALT 22; BUN 7; Creatinine 1.10; Hemoglobin 8.5; Magnesium 1.6; Platelet Count 304; Potassium 3.6; Sodium 139   Recent Lipid Panel Lab Results  Component Value Date/Time   CHOL 124 08/20/2021 12:37 PM   TRIG 86 08/20/2021 12:37 PM   HDL 55 08/20/2021 12:37 PM   CHOLHDL 2.3 08/20/2021 12:37 PM   CHOLHDL 3 07/08/2017 08:36 AM    LDLCALC 52 08/20/2021 12:37 PM   LDLDIRECT 97.7 02/11/2014 10:34 AM    Wt Readings from Last 3 Encounters:  04/10/22 189 lb (85.7 kg)  03/28/22 189 lb 14.4 oz (86.1 kg)  03/14/22 188 lb 9.6 oz (85.5 kg)     Exam:    Vital Signs:  Wt 189 lb (85.7 kg)   LMP 09/17/2012   BMI 29.60 kg/m     Physical Exam Vitals and nursing note reviewed.  HENT:     Head: Normocephalic and atraumatic.  Eyes:     Extraocular Movements: Extraocular movements intact.  Pulmonary:     Effort: Pulmonary effort is normal.     Comments: Non-labored breathing Musculoskeletal:     Cervical back: Normal range of motion.  Neurological:  Mental Status: She is alert and oriented to person, place, and time.  Psychiatric:        Mood and Affect: Affect normal.     ASSESSMENT & PLAN:    1. COVID Comments: She agrees to come to the office to confirm she is POS, since her test kit has expired. Will send renal dose of Paxlovid if POS. Recent GFR results reviewed.   I will also refer her for home monitoring/temperature monitoring program. She is encouraged to email me daily on mychart to let me know how she is doing. She is encouraged to go to ER should she develop worsening SOB. Pt advised that she will be out of work for at least one week. She verbalizes understanding of her treatment plan. All questions were answered to her satisfaction. She understands that she needs to continue to self quarantine x 5 days, and wear mask for another 5 days after that. She is encouraged to stay well hydrated as well. Advised that melatonin may help as well. All questions were answered to her satisfaction.   Addendum: Rapid testing was positive. Renal dose of Paxlovid was sent to the pharmacy. Possible side effects were d/w patient.   - Temperature monitoring; Future  2. Sinus congestion Comments: She is advised to take OTC antihistamine like Zyrtec nightly, advised to avoid dairy and to also drink a hot beverage daily. -  POC COVID-19  COVID-19 Education: The signs and symptoms of COVID-19 were discussed with the patient and how to seek care for testing (follow up with PCP or arrange E-visit).  The importance of social distancing was discussed today.  Patient Risk:   After full review of this patients clinical status, I feel that they are at least moderate risk at this time.  Time:   Today, I have spent 16 minutes seconds with the patient with telehealth technology discussing above diagnoses.     Medication Adjustments/Labs and Tests Ordered: Current medicines are reviewed at length with the patient today.  Concerns regarding medicines are outlined above.   Tests Ordered: Orders Placed This Encounter  Procedures   POC COVID-19    Medication Changes: Meds ordered this encounter  Medications   nirmatrelvir & ritonavir (PAXLOVID, 150/100,) 10 x 150 MG & 10 x 100MG TBPK    Sig: Take 2 tabs twice daily    Dispense:  20 tablet    Refill:  0    Disposition:  Follow up prn  Signed, Maximino Greenland, MD

## 2022-04-11 ENCOUNTER — Inpatient Hospital Stay: Payer: BC Managed Care – PPO | Admitting: Oncology

## 2022-04-11 ENCOUNTER — Inpatient Hospital Stay: Payer: BC Managed Care – PPO

## 2022-04-12 ENCOUNTER — Encounter: Payer: Self-pay | Admitting: Internal Medicine

## 2022-04-25 ENCOUNTER — Inpatient Hospital Stay (HOSPITAL_BASED_OUTPATIENT_CLINIC_OR_DEPARTMENT_OTHER): Payer: BC Managed Care – PPO | Admitting: Oncology

## 2022-04-25 ENCOUNTER — Inpatient Hospital Stay: Payer: BC Managed Care – PPO | Attending: Genetic Counselor

## 2022-04-25 ENCOUNTER — Inpatient Hospital Stay: Payer: BC Managed Care – PPO

## 2022-04-25 VITALS — BP 101/72 | HR 63 | Temp 97.8°F | Resp 20 | Ht 67.0 in | Wt 184.4 lb

## 2022-04-25 VITALS — BP 114/70 | HR 60 | Resp 20

## 2022-04-25 DIAGNOSIS — C7802 Secondary malignant neoplasm of left lung: Secondary | ICD-10-CM | POA: Insufficient documentation

## 2022-04-25 DIAGNOSIS — C250 Malignant neoplasm of head of pancreas: Secondary | ICD-10-CM

## 2022-04-25 DIAGNOSIS — C7801 Secondary malignant neoplasm of right lung: Secondary | ICD-10-CM | POA: Insufficient documentation

## 2022-04-25 DIAGNOSIS — Z5111 Encounter for antineoplastic chemotherapy: Secondary | ICD-10-CM | POA: Diagnosis not present

## 2022-04-25 DIAGNOSIS — Z23 Encounter for immunization: Secondary | ICD-10-CM | POA: Insufficient documentation

## 2022-04-25 DIAGNOSIS — Z79899 Other long term (current) drug therapy: Secondary | ICD-10-CM | POA: Insufficient documentation

## 2022-04-25 LAB — CMP (CANCER CENTER ONLY)
ALT: 25 U/L (ref 0–44)
AST: 22 U/L (ref 15–41)
Albumin: 3.5 g/dL (ref 3.5–5.0)
Alkaline Phosphatase: 131 U/L — ABNORMAL HIGH (ref 38–126)
Anion gap: 7 (ref 5–15)
BUN: 17 mg/dL (ref 6–20)
CO2: 19 mmol/L — ABNORMAL LOW (ref 22–32)
Calcium: 8.5 mg/dL — ABNORMAL LOW (ref 8.9–10.3)
Chloride: 112 mmol/L — ABNORMAL HIGH (ref 98–111)
Creatinine: 1.31 mg/dL — ABNORMAL HIGH (ref 0.44–1.00)
GFR, Estimated: 48 mL/min — ABNORMAL LOW (ref >60)
Glucose, Bld: 217 mg/dL — ABNORMAL HIGH (ref 70–99)
Potassium: 3.6 mmol/L (ref 3.5–5.1)
Sodium: 138 mmol/L (ref 135–145)
Total Bilirubin: 0.5 mg/dL (ref 0.3–1.2)
Total Protein: 7 g/dL (ref 6.5–8.1)

## 2022-04-25 LAB — CBC WITH DIFFERENTIAL (CANCER CENTER ONLY)
Abs Immature Granulocytes: 0.01 10*3/uL (ref 0.00–0.07)
Basophils Absolute: 0.1 10*3/uL (ref 0.0–0.1)
Basophils Relative: 1 %
Eosinophils Absolute: 0.1 10*3/uL (ref 0.0–0.5)
Eosinophils Relative: 2 %
HCT: 28.6 % — ABNORMAL LOW (ref 36.0–46.0)
Hemoglobin: 8.9 g/dL — ABNORMAL LOW (ref 12.0–15.0)
Immature Granulocytes: 0 %
Lymphocytes Relative: 23 %
Lymphs Abs: 1.2 10*3/uL (ref 0.7–4.0)
MCH: 29.2 pg (ref 26.0–34.0)
MCHC: 31.1 g/dL (ref 30.0–36.0)
MCV: 93.8 fL (ref 80.0–100.0)
Monocytes Absolute: 0.5 10*3/uL (ref 0.1–1.0)
Monocytes Relative: 10 %
Neutro Abs: 3.2 10*3/uL (ref 1.7–7.7)
Neutrophils Relative %: 64 %
Platelet Count: 315 10*3/uL (ref 150–400)
RBC: 3.05 MIL/uL — ABNORMAL LOW (ref 3.87–5.11)
RDW: 16.1 % — ABNORMAL HIGH (ref 11.5–15.5)
WBC Count: 5 10*3/uL (ref 4.0–10.5)
nRBC: 0 % (ref 0.0–0.2)

## 2022-04-25 MED ORDER — SODIUM CHLORIDE 0.9 % IV SOLN
2000.0000 mg | Freq: Once | INTRAVENOUS | Status: AC
  Administered 2022-04-25: 2000 mg via INTRAVENOUS
  Filled 2022-04-25: qty 52.6

## 2022-04-25 MED ORDER — PROCHLORPERAZINE MALEATE 10 MG PO TABS: ORAL_TABLET | ORAL | 0 refills | Status: DC

## 2022-04-25 MED ORDER — SODIUM CHLORIDE 0.9% FLUSH
10.0000 mL | INTRAVENOUS | Status: DC | PRN
  Administered 2022-04-25: 10 mL

## 2022-04-25 MED ORDER — PACLITAXEL PROTEIN-BOUND CHEMO INJECTION 100 MG
100.0000 mg/m2 | Freq: Once | INTRAVENOUS | Status: AC
  Administered 2022-04-25: 200 mg via INTRAVENOUS
  Filled 2022-04-25: qty 40

## 2022-04-25 MED ORDER — SODIUM CHLORIDE 0.9 % IV SOLN: Freq: Once | INTRAVENOUS | Status: AC

## 2022-04-25 MED ORDER — HEPARIN SOD (PORK) LOCK FLUSH 100 UNIT/ML IV SOLN
500.0000 [IU] | Freq: Once | INTRAVENOUS | Status: AC | PRN
  Administered 2022-04-25: 500 [IU]

## 2022-04-25 MED ORDER — INFLUENZA VAC SPLIT QUAD 0.5 ML IM SUSY
0.5000 mL | PREFILLED_SYRINGE | Freq: Once | INTRAMUSCULAR | Status: AC
  Administered 2022-04-25: 0.5 mL via INTRAMUSCULAR
  Filled 2022-04-25: qty 0.5

## 2022-04-25 MED ORDER — PROCHLORPERAZINE MALEATE 10 MG PO TABS
10.0000 mg | ORAL_TABLET | Freq: Once | ORAL | Status: AC
  Administered 2022-04-25: 10 mg via ORAL
  Filled 2022-04-25: qty 1

## 2022-04-25 NOTE — Progress Notes (Signed)
Patient seen by Dr. Benay Spice today  Vitals are within treatment parameters.  Labs reviewed by Dr. Benay Spice and are within treatment parameters.  Per physician team, patient is ready for treatment and there are NO modifications to the treatment plan. OK to receive flu shot today.

## 2022-04-25 NOTE — Progress Notes (Signed)
[place] [place] OFFICE PROGRESS NOTE   Diagnosis: Pancreas cancer  INTERVAL HISTORY:   Alyssa Ross returns as scheduled.  She was last treated with gemcitabine/Abraxane on 03/28/2022.  She reports stable neuropathy symptoms.  She was diagnosed with COVID-19 04/10/2022.  She completed a course of Paxlovid.  She reports rhinorrhea as the chief symptom associated with the COVID infection.  No dyspnea or cough.  No fever. She was seen at the [facility] on 04/04/2022.  She is currently not a candidate for the T-cell immunotherapy study due to the size of the lung nodules.  She will be a candidate if the lesions enlarge.  Objective:  Vital signs in last 24 hours:  Blood pressure 101/72, pulse 63, temperature 97.8 F (36.6 C), temperature source Oral, resp. rate 20, height _0  (1.702 m), weight 184 lb 6.4 oz (83.6 kg), last menstrual period 09/17/2012, SpO2 100 %.    HEENT: No thrush or ulcers Resp: Lungs clear bilaterally Cardio: Regular rate and rhythm GI: No hepatosplenomegaly Vascular: No leg edema   Portacath/PICC-without erythema  Lab Results:  Lab Results  Component Value Date   WBC 5.0 04/25/2022   HGB 8.9 (L) 04/25/2022   HCT 28.6 (L) 04/25/2022   MCV 93.8 04/25/2022   PLT 315 04/25/2022   NEUTROABS 3.2 04/25/2022    CMP  Lab Results  Component Value Date   NA 138 04/25/2022   K 3.6 04/25/2022   CL 112 (H) 04/25/2022   CO2 19 (L) 04/25/2022   GLUCOSE 217 (H) 04/25/2022   BUN 17 04/25/2022   CREATININE 1.31 (H) 04/25/2022   CALCIUM 8.5 (L) 04/25/2022   PROT 7.0 04/25/2022   ALBUMIN 3.5 04/25/2022   AST 22 04/25/2022   ALT 25 04/25/2022   ALKPHOS 131 (H) 04/25/2022   BILITOT 0.5 04/25/2022   GFRNONAA 48 (L) 04/25/2022   GFRAA >60 02/07/2020    Lab Results  Component Value Date   CAN199 71 (H) 03/28/2022     Medications: I have reviewed the patient's current medications.   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, pT3,pN2 Tumor invades the duodenal wall and vascular groove, resection margins negative, 4/34 lymph nodes positive MSI-stable, tumor showed instability in 2 loci as did adjacent normal tissue Foundation 1-K-ras G12 V, microsatellite status and tumor mutation burden cannot be determined EUS FNA biopsy of pancreas mass on 07/03/2018-well-differentiated neuroendocrine tumor CTs 01/29/2019-ill-defined pancreas head mass, 5 pulmonary nodules-1 with a small amount of central cavitation, tumor abuts the left margin of the portal vein indistinct density surrounding, hepatic artery, complex cystic lesion of the right kidney, right adrenal mass-characterized as an adenoma on a [facility] MRI 12/21/2018 [facility] 03/03/2019-no focal pancreas activity, no tracer accumulation within the suspicious pulmonary nodules, left uterine mass with tracer accumulation felt to represent a leiomyoma Elevated preoperative CA 19-9--CA 19-9 186 on 01/18/2019 CT chest 04/16/2019-multiple bilateral pulmonary nodules, some with increased cavitation, stable in size Cycle 1 FOLFIRINOX 04/27/2019 Cycle 2 FOLFIRINOX 05/11/2019 Cycle 3 FOLFIRINOX 05/23/2019 Cycle 4 FOLFIRINOX 06/08/2019 Cycle 5 FOLFIRINOX 06/22/2019 CT chest 07/02/2019-stable size of bilateral pulmonary nodules.  Dominant cavitary lesions in both lungs show increased cavitation with thinner walls.  Stable 2.1 cm right adrenal nodule. 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 No definite evidence of metastatic disease in the abdomen  or pelvis. Markedly enlarged and heterogeneous appearing uterus, likely to represent multifocal fibroids. 1 of these lesions appears to be an exophytic subserosal fibroid in the posterior lateral aspect of the uterine body on the left side although this comes in close proximity to the left adnexa such that a primary ovarian lesion is difficult to completely exclude. CTs 02/07/2020-slight enlargement of bilateral lung nodules, some are cavitary, no evidence of metastatic disease in the abdomen or pelvis, stable right adrenal nodule, uterine fibroids CTs 04/26/2020-mild enlargement of pulmonary nodules, slight increase in trace pelvic fluid, new soft tissue thickening inferior to the cecal tip suspicious for peritoneal metastasis CT 05/26/2020-improved appearance of soft tissue at the inferior tip of the cecum, mildly thickened short appendix-findings suggestive of resolving appendicitis, stable small bibasilar pulmonary nodules, fibroids Plan biopsy of right cecal tip soft tissue canceled secondary to radiologic improvement CT chest 08/02/2020-enlargement and progressive cavitation of multiple bilateral lung nodules.  Some new nodules are present. CTs 10/24/2020- increase in size of pulmonary nodules, no new nodules, no evidence of metastatic disease in the abdomen, stable right adrenal nodule CT 01/09/2021-slight interval enlargement of pulmonary nodules, stable right adrenal nodule Navigation bronchoscopy 01/30/2021-left lower lobe cavitary nodule FNA-adenocarcinoma, brushing-adenocarcinoma.  Left lower lobe lavage-adenocarcinoma.  Right upper lobe brushing and FNA biopsy of cavitary nodule-adenocarcinoma-immunohistochemical profile consistent with pancreas adenocarcinoma Cycle 1 gemcitabine/Abraxane 03/28/2021 Cycle 2 gemcitabine/Abraxane 04/11/2021 Cycle 3 gemcitabine/Abraxane 04/25/2021 Cycle 4 gemcitabine/Abraxane 05/09/2021 Cycle 5 gemcitabine/Abraxane 05/23/2021 CT chest 06/05/2021-interval cavitation  of multiple pulmonary nodules, some nodules have decreased in size, no new or enlarging nodules Cycle 6 gemcitabine/Abraxane 06/06/2021 Cycle 7 gemcitabine/Abraxane 06/21/2021 Cycle 8 gemcitabine/Abraxane 07/05/2021 Cycle 9 Gemcitabine/Abraxane 07/19/2021 Cycle 10 gemcitabine 08/01/2021-Abraxane held secondary to neuropathy CT chest 08/13/2021-mild decrease in size and wall thickness of multiple cavitary nodules, no new or progressive disease in the chest, indeterminate low-attenuation right liver lesions Cycle 11 gemcitabine 08/16/2021-Abraxane held secondary to neuropathy Cycle 12 gemcitabine 08/30/2021-Abraxane held secondary to neuropathy Cycle 13 gemcitabine 09/13/2021-Abraxane held secondary to neuropathy Cycle 14 gemcitabine 09/27/2021-Abraxane held secondary to neuropathy Cycle 15 gemcitabine 10/11/2021-Abraxane held secondary to neuropathy CTs 10/22/2021-no change in multiple cavitary lung nodules, no evidence of disease progression, ill-defined hypodense lesion in the posterior right liver suspicious for metastatic disease Cycle 16 gemcitabine 10/25/2021 Cycle 17 gemcitabine 11/08/2021 Cycle 18 Gemcitabine 11/22/2021 Cycle 19 gemcitabine 12/06/2021 Cycle 20 Gemcitabine 12/20/2021 CT 12/31/2021-mild increase in size of bilateral pulmonary metastases, stable subtle continuation right liver lesions Cycle 20 gemcitabine/Abraxane 01/03/2022 Cycle 21 gemcitabine/Abraxane 01/17/2022 Cycle 22 gemcitabine/Abraxane 01/31/2022 Cycle 23 gemcitabine/Abraxane 02/14/2022 Cycle 24 gemcitabine/Abraxane 02/28/2022 CTs 03/11/2022-widespread metastatic disease to the lungs again noted with slight involution of several of the pulmonary nodules, no definite new nodules noted; interval cavitation of solid lesion in the posterior aspect right lobe of the liver, no new liver lesions noted. Cycle 25 Gemcitabine/Abraxane 03/14/2022 Cycle 26 gemcitabine/Abraxane 03/28/2022 Cycle 27 gemcitabine/Abraxane 04/25/2022   Partial right  nephrectomy 03/04/2019-cystic nephroma Diabetes Hypertension Family history of pancreas cancer, INVITAE panel-VUS in the TERT Port-A-Cath placement, Dr. 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  Wickizer appears stable.  She will continue treatment with gemcitabine/Abraxane.  We will plan for a restaging chest CT at a 59-monthinterval.  She understands the potential for progression of neuropathy symptoms while continuing treat with Abraxane.  She agrees to proceed.  [facility] will [facility] if the lung lesions enlarge.  She will receive an influenza vaccine today.  GBetsy Coder MD  04/25/2022  12:02 PM

## 2022-04-25 NOTE — Patient Instructions (Addendum)
[facility]  Discharge Instructions: Thank you for choosing [facility] to provide your oncology and hematology care.   If you have a lab appointment with the [facility], please go directly to the [facility] and check in at the registration area.   Wear comfortable clothing and clothing appropriate for easy access to any Portacath or PICC line.   We strive to give you quality time with your provider. You may need to reschedule your appointment if you arrive late (15 or more minutes).  Arriving late affects you and other patients whose appointments are after yours.  Also, if you miss three or more appointments without notifying the office, you may be dismissed from the clinic at the provider's discretion.      For prescription refill requests, have your pharmacy contact our office and allow 72 hours for refills to be completed.    Today you received the following chemotherapy and/or immunotherapy agents Paclitaxel-protein bound (ABRAXANE) & Gemcitabine (GEMZAR).      To help prevent nausea and vomiting after your treatment, we encourage you to take your nausea medication as directed.  BELOW ARE SYMPTOMS THAT SHOULD BE REPORTED IMMEDIATELY: *FEVER GREATER THAN 100.4 F (38 C) OR HIGHER *CHILLS OR SWEATING *NAUSEA AND VOMITING THAT IS NOT CONTROLLED WITH YOUR NAUSEA MEDICATION *UNUSUAL SHORTNESS OF BREATH *UNUSUAL BRUISING OR BLEEDING *URINARY PROBLEMS (pain or burning when urinating, or frequent urination) *BOWEL PROBLEMS (unusual diarrhea, constipation, pain near the anus) TENDERNESS IN MOUTH AND THROAT WITH OR WITHOUT PRESENCE OF ULCERS (sore throat, sores in mouth, or a toothache) UNUSUAL RASH, SWELLING OR PAIN  UNUSUAL VAGINAL DISCHARGE OR ITCHING   Items with * indicate a potential emergency and should be followed up as soon as possible or go to the Emergency Department if any problems should occur.  Please show the CHEMOTHERAPY ALERT  CARD or IMMUNOTHERAPY ALERT CARD at check-in to the Emergency Department and triage nurse.  Should you have questions after your visit or need to cancel or reschedule your appointment, please contact [facility]  Dept: [phone]  and follow the prompts.  Office hours are 8:00 a.m. to 4:30 p.m. Monday - Friday. Please note that voicemails left after 4:00 p.m. may not be returned until the following business day.  We are closed weekends and major holidays. You have access to a nurse at all times for urgent questions. Please call the main number to the clinic Dept: [phone] and follow the prompts.   For any non-urgent questions, you may also contact your provider using MyChart. We now offer e-Visits for anyone 42 and older to request care online for non-urgent symptoms. For details visit [email or web address].   Also download the MyChart app! Go to the app store, search "MyChart", open the app, select [facility], and log in with your MyChart username and password.  Masks are optional in the cancer centers. If you would like for your care team to wear a mask while they are taking care of you, please let them know. You may have one support person who is at least 56 years old accompany you for your appointments.  Paclitaxel Nanoparticle Albumin-Bound Injection What is this medication? NANOPARTICLE ALBUMIN-BOUND PACLITAXEL (Na no PAHR ti kuhl al BYOO muhn-bound PAK li TAX el) treats some types of cancer. It works by slowing down the growth of cancer cells. This medicine may be used for other purposes; ask your health care provider or pharmacist if you have  questions. COMMON BRAND NAME(S): Abraxane What should I tell my care team before I take this medication? They need to know if you have any of these conditions: Liver disease Low white blood cell levels An unusual or allergic reaction to paclitaxel, albumin, other medications, foods, dyes, or preservatives If you  or your partner are pregnant or trying to get pregnant Breast-feeding How should I use this medication? This medication is injected into a vein. It is given by your care team in a hospital or clinic setting. Talk to your care team about the use of this medication in children. Special care may be needed. Overdosage: If you think you have taken too much of this medicine contact a poison control center or emergency room at once. NOTE: This medicine is only for you. Do not share this medicine with others. What if I miss a dose? Keep appointments for follow-up doses. It is important not to miss your dose. Call your care team if you are unable to keep an appointment. What may interact with this medication? Other medications may affect the way this medication works. Talk with your care team about all of the medications you take. They may suggest changes to your treatment plan to lower the risk of side effects and to make sure your medications work as intended. This list may not describe all possible interactions. Give your health care provider a list of all the medicines, herbs, non-prescription drugs, or dietary supplements you use. Also tell them if you smoke, drink alcohol, or use illegal drugs. Some items may interact with your medicine. What should I watch for while using this medication? Your condition will be monitored carefully while you are receiving this medication. You may need blood work while taking this medication. This medication may make you feel generally unwell. This is not uncommon as chemotherapy can affect healthy cells as well as cancer cells. Report any side effects. Continue your course of treatment even though you feel ill unless your care team tells you to stop. This medication can cause serious allergic reactions. To reduce the risk, your care team may give you other medications to take before receiving this one. Be sure to follow the directions from your care team. This  medication may increase your risk of getting an infection. Call your care team for advice if you get a fever, chills, sore throat, or other symptoms of a cold or flu. Do not treat yourself. Try to avoid being around people who are sick. This medication may increase your risk to bruise or bleed. Call your care team if you notice any unusual bleeding. Be careful brushing or flossing your teeth or using a toothpick because you may get an infection or bleed more easily. If you have any dental work done, tell your dentist you are receiving this medication. Talk to your care team if you or your partner may be pregnant. Serious birth defects can occur if you take this medication during pregnancy and for 6 months after the last dose. You will need a negative pregnancy test before starting this medication. Contraception is recommended while taking this medication and for 6 months after the last dose. Your care team can help you find the option that works for you. If your partner can get pregnant, use a condom during sex while taking this medication and for 3 months after the last dose. Do not breastfeed while taking this medication and for 2 weeks after the last dose. This medication may cause infertility. Talk to  your care team if you are concerned about your fertility. What side effects may I notice from receiving this medication? Side effects that you should report to your care team as soon as possible: Allergic reactions--skin rash, itching, hives, swelling of the face, lips, tongue, or throat Dry cough, shortness of breath or trouble breathing Infection--fever, chills, cough, sore throat, wounds that don't heal, pain or trouble when passing urine, general feeling of discomfort or being unwell Low red blood cell level--unusual weakness or fatigue, dizziness, headache, trouble breathing Pain, tingling, or numbness in the hands or feet Stomach pain, unusual weakness or fatigue, nausea, vomiting, diarrhea, or  fever that lasts longer than expected Unusual bruising or bleeding Side effects that usually do not require medical attention (report to your care team if they continue or are bothersome): Diarrhea Fatigue Hair loss Loss of appetite Nausea Vomiting This list may not describe all possible side effects. Call your doctor for medical advice about side effects. You may report side effects to FDA at 1-800-FDA-1088. Where should I keep my medication? This medication is given in a hospital or clinic. It will not be stored at home. NOTE: This sheet is a summary. It may not cover all possible information. If you have questions about this medicine, talk to your doctor, pharmacist, or health care provider.  2023 Elsevier/Gold Standard (2007-07-18 00:00:00)  Gemcitabine Injection What is this medication? GEMCITABINE (jem SYE ta been) treats some types of cancer. It works by slowing down the growth of cancer cells. This medicine may be used for other purposes; ask your health care provider or pharmacist if you have questions. COMMON BRAND NAME(S): Gemzar, Infugem What should I tell my care team before I take this medication? They need to know if you have any of these conditions: Blood disorders Infection Kidney disease Liver disease Lung or breathing disease, such as asthma or COPD Recent or ongoing radiation therapy An unusual or allergic reaction to gemcitabine, other medications, foods, dyes, or preservatives If you or your partner are pregnant or trying to get pregnant Breast-feeding How should I use this medication? This medication is injected into a vein. It is given by your care team in a hospital or clinic setting. Talk to your care team about the use of this medication in children. Special care may be needed. Overdosage: If you think you have taken too much of this medicine contact a poison control center or emergency room at once. NOTE: This medicine is only for you. Do not share this  medicine with others. What if I miss a dose? Keep appointments for follow-up doses. It is important not to miss your dose. Call your care team if you are unable to keep an appointment. What may interact with this medication? Interactions have not been studied. This list may not describe all possible interactions. Give your health care provider a list of all the medicines, herbs, non-prescription drugs, or dietary supplements you use. Also tell them if you smoke, drink alcohol, or use illegal drugs. Some items may interact with your medicine. What should I watch for while using this medication? Your condition will be monitored carefully while you are receiving this medication. This medication may make you feel generally unwell. This is not uncommon, as chemotherapy can affect healthy cells as well as cancer cells. Report any side effects. Continue your course of treatment even though you feel ill unless your care team tells you to stop. In some cases, you may be given additional medications to help with  side effects. Follow all directions for their use. This medication may increase your risk of getting an infection. Call your care team for advice if you get a fever, chills, sore throat, or other symptoms of a cold or flu. Do not treat yourself. Try to avoid being around people who are sick. This medication may increase your risk to bruise or bleed. Call your care team if you notice any unusual bleeding. Be careful brushing or flossing your teeth or using a toothpick because you may get an infection or bleed more easily. If you have any dental work done, tell your dentist you are receiving this medication. Avoid taking medications that contain aspirin, acetaminophen, ibuprofen, naproxen, or ketoprofen unless instructed by your care team. These medications may hide a fever. Talk to your care team if you or your partner wish to become pregnant or think you might be pregnant. This medication can cause  serious birth defects if taken during pregnancy and for 6 months after the last dose. A negative pregnancy test is required before starting this medication. A reliable form of contraception is recommended while taking this medication and for 6 months after the last dose. Talk to your care team about effective forms of contraception. Do not father a child while taking this medication and for 3 months after the last dose. Use a condom while having sex during this time period. Do not breastfeed while taking this medication and for at least 1 week after the last dose. This medication may cause infertility. Talk to your care team if you are concerned about your fertility. What side effects may I notice from receiving this medication? Side effects that you should report to your care team as soon as possible: Allergic reactions--skin rash, itching, hives, swelling of the face, lips, tongue, or throat Capillary leak syndrome--stomach or muscle pain, unusual weakness or fatigue, feeling faint or lightheaded, decrease in the amount of urine, swelling of the ankles, hands, or feet, trouble breathing Infection--fever, chills, cough, sore throat, wounds that don't heal, pain or trouble when passing urine, general feeling of discomfort or being unwell Liver injury--right upper belly pain, loss of appetite, nausea, light-colored stool, dark yellow or brown urine, yellowing skin or eyes, unusual weakness or fatigue Low red blood cell level--unusual weakness or fatigue, dizziness, headache, trouble breathing Lung injury--shortness of breath or trouble breathing, cough, spitting up blood, chest pain, fever Stomach pain, bloody diarrhea, pale skin, unusual weakness or fatigue, decrease in the amount of urine, which may be signs of hemolytic uremic syndrome Sudden and severe headache, confusion, change in vision, seizures, which may be signs of posterior reversible encephalopathy syndrome (PRES) Unusual bruising or  bleeding Side effects that usually do not require medical attention (report to your care team if they continue or are bothersome): Diarrhea Drowsiness Hair loss Nausea Pain, redness, or swelling with sores inside the mouth or throat Vomiting This list may not describe all possible side effects. Call your doctor for medical advice about side effects. You may report side effects to FDA at 1-800-FDA-1088. Where should I keep my medication? This medication is given in a hospital or clinic. It will not be stored at home. NOTE: This sheet is a summary. It may not cover all possible information. If you have questions about this medicine, talk to your doctor, pharmacist, or health care provider.  2023 Elsevier/Gold Standard (2007-07-18 00:00:00)  Influenza Vaccine Injection What is this medication? INFLUENZA VACCINE (in floo EN zuh vak SEEN) reduces the risk of the  influenza (flu). It does not treat influenza. It is still possible to get influenza after receiving this vaccine, but the symptoms may be less severe or not last as long. It works by helping your immune system learn how to fight off a future infection. This medicine may be used for other purposes; ask your health care provider or pharmacist if you have questions. COMMON BRAND NAME(S): Afluria, Afluria Quadrivalent, Agriflu, Alfuria, FLUAD, FLUAD Quadrivalent, Fluarix, Fluarix Quadrivalent, Flublok, Flublok Quadrivalent, FLUCELVAX, FLUCELVAX Quadrivalent, Flulaval, Flulaval Quadrivalent, Fluvirin, Fluzone, Fluzone High-Dose, Fluzone Intradermal, Fluzone Quadrivalent What should I tell my care team before I take this medication? They need to know if you have any of these conditions: Bleeding disorder like hemophilia Fever or infection Guillain-Barre syndrome or other neurological problems Immune system problems Infection with the human immunodeficiency virus (HIV) or AIDS Low blood platelet counts Multiple sclerosis An unusual or  allergic reaction to influenza virus vaccine, latex, other medications, foods, dyes, or preservatives. Different brands of vaccines contain different allergens. Some may contain latex or eggs. Talk to your care team about your allergies to make sure that you get the right vaccine. Pregnant or trying to get pregnant Breastfeeding How should I use this medication? This vaccine is injected into a muscle or under the skin. It is given by your care team. A copy of Vaccine Information Statements will be given before each vaccination. Be sure to read this sheet carefully each time. This sheet may change often. Talk to your care team to see which vaccines are right for you. Some vaccines should not be used in all age groups. Overdosage: If you think you have taken too much of this medicine contact a poison control center or emergency room at once. NOTE: This medicine is only for you. Do not share this medicine with others. What if I miss a dose? This does not apply. What may interact with this medication? Certain medications that lower your immune system, such as etanercept, anakinra, infliximab, adalimumab Certain medications that prevent or treat blood clots, such as warfarin Chemotherapy or radiation therapy Phenytoin Steroid medications, such as prednisone or cortisone Theophylline Vaccines This list may not describe all possible interactions. Give your health care provider a list of all the medicines, herbs, non-prescription drugs, or dietary supplements you use. Also tell them if you smoke, drink alcohol, or use illegal drugs. Some items may interact with your medicine. What should I watch for while using this medication? Report any side effects that do not go away with your care team. Call your care team if any unusual symptoms occur within 6 weeks of receiving this vaccine. You may still catch the flu, but the illness is not usually as bad. You cannot get the flu from the vaccine. The vaccine  will not protect against colds or other illnesses that may cause fever. The vaccine is needed every year. What side effects may I notice from receiving this medication? Side effects that you should report to your care team as soon as possible: Allergic reactions--skin rash, itching, hives, swelling of the face, lips, tongue, or throat Side effects that usually do not require medical attention (report these to your care team if they continue or are bothersome): Chills Fatigue Headache Joint pain Loss of appetite Muscle pain Nausea Pain, redness, or irritation at injection site This list may not describe all possible side effects. Call your doctor for medical advice about side effects. You may report side effects to FDA at 1-800-FDA-1088. Where should I keep  my medication? The vaccine is only given by your care team. It will not be stored at home. NOTE: This sheet is a summary. It may not cover all possible information. If you have questions about this medicine, talk to your doctor, pharmacist, or health care provider.  2023 Elsevier/Gold Standard (2007-07-18 00:00:00)

## 2022-04-25 NOTE — Addendum Note (Signed)
Addended by: Tania Ade on: 04/25/2022 12:14 PM   Modules accepted: Orders

## 2022-05-02 ENCOUNTER — Other Ambulatory Visit: Payer: Self-pay | Admitting: Oncology

## 2022-05-08 DIAGNOSIS — Z713 Dietary counseling and surveillance: Secondary | ICD-10-CM | POA: Diagnosis not present

## 2022-05-09 ENCOUNTER — Inpatient Hospital Stay: Payer: BC Managed Care – PPO

## 2022-05-09 ENCOUNTER — Inpatient Hospital Stay (HOSPITAL_BASED_OUTPATIENT_CLINIC_OR_DEPARTMENT_OTHER): Payer: BC Managed Care – PPO | Admitting: Oncology

## 2022-05-09 VITALS — BP 117/88 | HR 66

## 2022-05-09 VITALS — BP 116/77 | HR 73 | Temp 98.1°F | Resp 18 | Ht 67.0 in | Wt 185.8 lb

## 2022-05-09 DIAGNOSIS — C250 Malignant neoplasm of head of pancreas: Secondary | ICD-10-CM

## 2022-05-09 DIAGNOSIS — C7801 Secondary malignant neoplasm of right lung: Secondary | ICD-10-CM | POA: Diagnosis not present

## 2022-05-09 DIAGNOSIS — Z79899 Other long term (current) drug therapy: Secondary | ICD-10-CM | POA: Diagnosis not present

## 2022-05-09 DIAGNOSIS — Z23 Encounter for immunization: Secondary | ICD-10-CM | POA: Diagnosis not present

## 2022-05-09 DIAGNOSIS — C7802 Secondary malignant neoplasm of left lung: Secondary | ICD-10-CM | POA: Diagnosis not present

## 2022-05-09 DIAGNOSIS — Z5111 Encounter for antineoplastic chemotherapy: Secondary | ICD-10-CM | POA: Diagnosis not present

## 2022-05-09 LAB — CMP (CANCER CENTER ONLY)
ALT: 27 U/L (ref 0–44)
AST: 28 U/L (ref 15–41)
Albumin: 3.8 g/dL (ref 3.5–5.0)
Alkaline Phosphatase: 148 U/L — ABNORMAL HIGH (ref 38–126)
Anion gap: 7 (ref 5–15)
BUN: 11 mg/dL (ref 6–20)
CO2: 22 mmol/L (ref 22–32)
Calcium: 8.6 mg/dL — ABNORMAL LOW (ref 8.9–10.3)
Chloride: 109 mmol/L (ref 98–111)
Creatinine: 1.08 mg/dL — ABNORMAL HIGH (ref 0.44–1.00)
GFR, Estimated: >60 mL/min (ref >60)
Glucose, Bld: 160 mg/dL — ABNORMAL HIGH (ref 70–99)
Potassium: 3.6 mmol/L (ref 3.5–5.1)
Sodium: 138 mmol/L (ref 135–145)
Total Bilirubin: 0.5 mg/dL (ref 0.3–1.2)
Total Protein: 7.3 g/dL (ref 6.5–8.1)

## 2022-05-09 LAB — CBC WITH DIFFERENTIAL (CANCER CENTER ONLY)
Abs Immature Granulocytes: 0.02 10*3/uL (ref 0.00–0.07)
Basophils Absolute: 0 10*3/uL (ref 0.0–0.1)
Basophils Relative: 1 %
Eosinophils Absolute: 0.2 10*3/uL (ref 0.0–0.5)
Eosinophils Relative: 4 %
HCT: 29.6 % — ABNORMAL LOW (ref 36.0–46.0)
Hemoglobin: 9.3 g/dL — ABNORMAL LOW (ref 12.0–15.0)
Immature Granulocytes: 1 %
Lymphocytes Relative: 27 %
Lymphs Abs: 1.1 10*3/uL (ref 0.7–4.0)
MCH: 29.4 pg (ref 26.0–34.0)
MCHC: 31.4 g/dL (ref 30.0–36.0)
MCV: 93.7 fL (ref 80.0–100.0)
Monocytes Absolute: 0.5 10*3/uL (ref 0.1–1.0)
Monocytes Relative: 11 %
Neutro Abs: 2.4 10*3/uL (ref 1.7–7.7)
Neutrophils Relative %: 56 %
Platelet Count: 361 10*3/uL (ref 150–400)
RBC: 3.16 MIL/uL — ABNORMAL LOW (ref 3.87–5.11)
RDW: 15.4 % (ref 11.5–15.5)
WBC Count: 4.2 10*3/uL (ref 4.0–10.5)
nRBC: 0 % (ref 0.0–0.2)

## 2022-05-09 LAB — MAGNESIUM: Magnesium: 1.7 mg/dL (ref 1.7–2.4)

## 2022-05-09 MED ORDER — HEPARIN SOD (PORK) LOCK FLUSH 100 UNIT/ML IV SOLN
500.0000 [IU] | Freq: Once | INTRAVENOUS | Status: AC | PRN
  Administered 2022-05-09: 500 [IU]

## 2022-05-09 MED ORDER — SODIUM CHLORIDE 0.9 % IV SOLN
2000.0000 mg | Freq: Once | INTRAVENOUS | Status: AC
  Administered 2022-05-09: 2000 mg via INTRAVENOUS
  Filled 2022-05-09: qty 52.6

## 2022-05-09 MED ORDER — SODIUM CHLORIDE 0.9 % IV SOLN: Freq: Once | INTRAVENOUS | Status: AC

## 2022-05-09 MED ORDER — PACLITAXEL PROTEIN-BOUND CHEMO INJECTION 100 MG
100.0000 mg/m2 | Freq: Once | INTRAVENOUS | Status: AC
  Administered 2022-05-09: 200 mg via INTRAVENOUS
  Filled 2022-05-09: qty 40

## 2022-05-09 MED ORDER — SODIUM CHLORIDE 0.9% FLUSH
10.0000 mL | INTRAVENOUS | Status: DC | PRN
  Administered 2022-05-09: 10 mL

## 2022-05-09 MED ORDER — PROCHLORPERAZINE MALEATE 10 MG PO TABS
10.0000 mg | ORAL_TABLET | Freq: Once | ORAL | Status: AC
  Administered 2022-05-09: 10 mg via ORAL
  Filled 2022-05-09: qty 1

## 2022-05-09 NOTE — Patient Instructions (Signed)

## 2022-05-09 NOTE — Progress Notes (Signed)
Patient seen by Dr. Sherrill today ? ?Vitals are within treatment parameters. ? ?Labs reviewed by Dr. Sherrill and are within treatment parameters. ? ?Per physician team, patient is ready for treatment and there are NO modifications to the treatment plan.  ?

## 2022-05-09 NOTE — Patient Instructions (Signed)
[facility]  Discharge Instructions: Thank you for choosing [facility] to provide your oncology and hematology care.   If you have a lab appointment with the [facility], please go directly to the [facility] and check in at the registration area.   Wear comfortable clothing and clothing appropriate for easy access to any Portacath or PICC line.   We strive to give you quality time with your provider. You may need to reschedule your appointment if you arrive late (15 or more minutes).  Arriving late affects you and other patients whose appointments are after yours.  Also, if you miss three or more appointments without notifying the office, you may be dismissed from the clinic at the provider's discretion.      For prescription refill requests, have your pharmacy contact our office and allow 72 hours for refills to be completed.    Today you received the following chemotherapy and/or immunotherapy agents Paclitaxel-protein bound (ABRAXANE) & Gemcitabine (GEMZAR).      To help prevent nausea and vomiting after your treatment, we encourage you to take your nausea medication as directed.  BELOW ARE SYMPTOMS THAT SHOULD BE REPORTED IMMEDIATELY: *FEVER GREATER THAN 100.4 F (38 C) OR HIGHER *CHILLS OR SWEATING *NAUSEA AND VOMITING THAT IS NOT CONTROLLED WITH YOUR NAUSEA MEDICATION *UNUSUAL SHORTNESS OF BREATH *UNUSUAL BRUISING OR BLEEDING *URINARY PROBLEMS (pain or burning when urinating, or frequent urination) *BOWEL PROBLEMS (unusual diarrhea, constipation, pain near the anus) TENDERNESS IN MOUTH AND THROAT WITH OR WITHOUT PRESENCE OF ULCERS (sore throat, sores in mouth, or a toothache) UNUSUAL RASH, SWELLING OR PAIN  UNUSUAL VAGINAL DISCHARGE OR ITCHING   Items with * indicate a potential emergency and should be followed up as soon as possible or go to the Emergency Department if any problems should occur.  Please show the CHEMOTHERAPY ALERT  CARD or IMMUNOTHERAPY ALERT CARD at check-in to the Emergency Department and triage nurse.  Should you have questions after your visit or need to cancel or reschedule your appointment, please contact [facility]  Dept: [phone]  and follow the prompts.  Office hours are 8:00 a.m. to 4:30 p.m. Monday - Friday. Please note that voicemails left after 4:00 p.m. may not be returned until the following business day.  We are closed weekends and major holidays. You have access to a nurse at all times for urgent questions. Please call the main number to the clinic Dept: [phone] and follow the prompts.   For any non-urgent questions, you may also contact your provider using MyChart. We now offer e-Visits for anyone 51 and older to request care online for non-urgent symptoms. For details visit [email or web address].   Also download the MyChart app! Go to the app store, search "MyChart", open the app, select [facility], and log in with your MyChart username and password.  Masks are optional in the cancer centers. If you would like for your care team to wear a mask while they are taking care of you, please let them know. You may have one support person who is at least 56 years old accompany you for your appointments.  Paclitaxel Nanoparticle Albumin-Bound Injection What is this medication? NANOPARTICLE ALBUMIN-BOUND PACLITAXEL (Na no PAHR ti kuhl al BYOO muhn-bound PAK li TAX el) treats some types of cancer. It works by slowing down the growth of cancer cells. This medicine may be used for other purposes; ask your health care provider or pharmacist if you have  questions. COMMON BRAND NAME(S): Abraxane What should I tell my care team before I take this medication? They need to know if you have any of these conditions: Liver disease Low white blood cell levels An unusual or allergic reaction to paclitaxel, albumin, other medications, foods, dyes, or preservatives If you  or your partner are pregnant or trying to get pregnant Breast-feeding How should I use this medication? This medication is injected into a vein. It is given by your care team in a hospital or clinic setting. Talk to your care team about the use of this medication in children. Special care may be needed. Overdosage: If you think you have taken too much of this medicine contact a poison control center or emergency room at once. NOTE: This medicine is only for you. Do not share this medicine with others. What if I miss a dose? Keep appointments for follow-up doses. It is important not to miss your dose. Call your care team if you are unable to keep an appointment. What may interact with this medication? Other medications may affect the way this medication works. Talk with your care team about all of the medications you take. They may suggest changes to your treatment plan to lower the risk of side effects and to make sure your medications work as intended. This list may not describe all possible interactions. Give your health care provider a list of all the medicines, herbs, non-prescription drugs, or dietary supplements you use. Also tell them if you smoke, drink alcohol, or use illegal drugs. Some items may interact with your medicine. What should I watch for while using this medication? Your condition will be monitored carefully while you are receiving this medication. You may need blood work while taking this medication. This medication may make you feel generally unwell. This is not uncommon as chemotherapy can affect healthy cells as well as cancer cells. Report any side effects. Continue your course of treatment even though you feel ill unless your care team tells you to stop. This medication can cause serious allergic reactions. To reduce the risk, your care team may give you other medications to take before receiving this one. Be sure to follow the directions from your care team. This  medication may increase your risk of getting an infection. Call your care team for advice if you get a fever, chills, sore throat, or other symptoms of a cold or flu. Do not treat yourself. Try to avoid being around people who are sick. This medication may increase your risk to bruise or bleed. Call your care team if you notice any unusual bleeding. Be careful brushing or flossing your teeth or using a toothpick because you may get an infection or bleed more easily. If you have any dental work done, tell your dentist you are receiving this medication. Talk to your care team if you or your partner may be pregnant. Serious birth defects can occur if you take this medication during pregnancy and for 6 months after the last dose. You will need a negative pregnancy test before starting this medication. Contraception is recommended while taking this medication and for 6 months after the last dose. Your care team can help you find the option that works for you. If your partner can get pregnant, use a condom during sex while taking this medication and for 3 months after the last dose. Do not breastfeed while taking this medication and for 2 weeks after the last dose. This medication may cause infertility. Talk to  your care team if you are concerned about your fertility. What side effects may I notice from receiving this medication? Side effects that you should report to your care team as soon as possible: Allergic reactions--skin rash, itching, hives, swelling of the face, lips, tongue, or throat Dry cough, shortness of breath or trouble breathing Infection--fever, chills, cough, sore throat, wounds that don't heal, pain or trouble when passing urine, general feeling of discomfort or being unwell Low red blood cell level--unusual weakness or fatigue, dizziness, headache, trouble breathing Pain, tingling, or numbness in the hands or feet Stomach pain, unusual weakness or fatigue, nausea, vomiting, diarrhea, or  fever that lasts longer than expected Unusual bruising or bleeding Side effects that usually do not require medical attention (report to your care team if they continue or are bothersome): Diarrhea Fatigue Hair loss Loss of appetite Nausea Vomiting This list may not describe all possible side effects. Call your doctor for medical advice about side effects. You may report side effects to FDA at 1-800-FDA-1088. Where should I keep my medication? This medication is given in a hospital or clinic. It will not be stored at home. NOTE: This sheet is a summary. It may not cover all possible information. If you have questions about this medicine, talk to your doctor, pharmacist, or health care provider.  2023 Elsevier/Gold Standard (2007-07-18 00:00:00)  Gemcitabine Injection What is this medication? GEMCITABINE (jem SYE ta been) treats some types of cancer. It works by slowing down the growth of cancer cells. This medicine may be used for other purposes; ask your health care provider or pharmacist if you have questions. COMMON BRAND NAME(S): Gemzar, Infugem What should I tell my care team before I take this medication? They need to know if you have any of these conditions: Blood disorders Infection Kidney disease Liver disease Lung or breathing disease, such as asthma or COPD Recent or ongoing radiation therapy An unusual or allergic reaction to gemcitabine, other medications, foods, dyes, or preservatives If you or your partner are pregnant or trying to get pregnant Breast-feeding How should I use this medication? This medication is injected into a vein. It is given by your care team in a hospital or clinic setting. Talk to your care team about the use of this medication in children. Special care may be needed. Overdosage: If you think you have taken too much of this medicine contact a poison control center or emergency room at once. NOTE: This medicine is only for you. Do not share this  medicine with others. What if I miss a dose? Keep appointments for follow-up doses. It is important not to miss your dose. Call your care team if you are unable to keep an appointment. What may interact with this medication? Interactions have not been studied. This list may not describe all possible interactions. Give your health care provider a list of all the medicines, herbs, non-prescription drugs, or dietary supplements you use. Also tell them if you smoke, drink alcohol, or use illegal drugs. Some items may interact with your medicine. What should I watch for while using this medication? Your condition will be monitored carefully while you are receiving this medication. This medication may make you feel generally unwell. This is not uncommon, as chemotherapy can affect healthy cells as well as cancer cells. Report any side effects. Continue your course of treatment even though you feel ill unless your care team tells you to stop. In some cases, you may be given additional medications to help with  side effects. Follow all directions for their use. This medication may increase your risk of getting an infection. Call your care team for advice if you get a fever, chills, sore throat, or other symptoms of a cold or flu. Do not treat yourself. Try to avoid being around people who are sick. This medication may increase your risk to bruise or bleed. Call your care team if you notice any unusual bleeding. Be careful brushing or flossing your teeth or using a toothpick because you may get an infection or bleed more easily. If you have any dental work done, tell your dentist you are receiving this medication. Avoid taking medications that contain aspirin, acetaminophen, ibuprofen, naproxen, or ketoprofen unless instructed by your care team. These medications may hide a fever. Talk to your care team if you or your partner wish to become pregnant or think you might be pregnant. This medication can cause  serious birth defects if taken during pregnancy and for 6 months after the last dose. A negative pregnancy test is required before starting this medication. A reliable form of contraception is recommended while taking this medication and for 6 months after the last dose. Talk to your care team about effective forms of contraception. Do not father a child while taking this medication and for 3 months after the last dose. Use a condom while having sex during this time period. Do not breastfeed while taking this medication and for at least 1 week after the last dose. This medication may cause infertility. Talk to your care team if you are concerned about your fertility. What side effects may I notice from receiving this medication? Side effects that you should report to your care team as soon as possible: Allergic reactions--skin rash, itching, hives, swelling of the face, lips, tongue, or throat Capillary leak syndrome--stomach or muscle pain, unusual weakness or fatigue, feeling faint or lightheaded, decrease in the amount of urine, swelling of the ankles, hands, or feet, trouble breathing Infection--fever, chills, cough, sore throat, wounds that don't heal, pain or trouble when passing urine, general feeling of discomfort or being unwell Liver injury--right upper belly pain, loss of appetite, nausea, light-colored stool, dark yellow or brown urine, yellowing skin or eyes, unusual weakness or fatigue Low red blood cell level--unusual weakness or fatigue, dizziness, headache, trouble breathing Lung injury--shortness of breath or trouble breathing, cough, spitting up blood, chest pain, fever Stomach pain, bloody diarrhea, pale skin, unusual weakness or fatigue, decrease in the amount of urine, which may be signs of hemolytic uremic syndrome Sudden and severe headache, confusion, change in vision, seizures, which may be signs of posterior reversible encephalopathy syndrome (PRES) Unusual bruising or  bleeding Side effects that usually do not require medical attention (report to your care team if they continue or are bothersome): Diarrhea Drowsiness Hair loss Nausea Pain, redness, or swelling with sores inside the mouth or throat Vomiting This list may not describe all possible side effects. Call your doctor for medical advice about side effects. You may report side effects to FDA at 1-800-FDA-1088. Where should I keep my medication? This medication is given in a hospital or clinic. It will not be stored at home. NOTE: This sheet is a summary. It may not cover all possible information. If you have questions about this medicine, talk to your doctor, pharmacist, or health care provider.  2023 Elsevier/Gold Standard (2007-07-18 00:00:00)  Influenza Vaccine Injection What is this medication? INFLUENZA VACCINE (in floo EN zuh vak SEEN) reduces the risk of the  influenza (flu). It does not treat influenza. It is still possible to get influenza after receiving this vaccine, but the symptoms may be less severe or not last as long. It works by helping your immune system learn how to fight off a future infection. This medicine may be used for other purposes; ask your health care provider or pharmacist if you have questions. COMMON BRAND NAME(S): Afluria, Afluria Quadrivalent, Agriflu, Alfuria, FLUAD, FLUAD Quadrivalent, Fluarix, Fluarix Quadrivalent, Flublok, Flublok Quadrivalent, FLUCELVAX, FLUCELVAX Quadrivalent, Flulaval, Flulaval Quadrivalent, Fluvirin, Fluzone, Fluzone High-Dose, Fluzone Intradermal, Fluzone Quadrivalent What should I tell my care team before I take this medication? They need to know if you have any of these conditions: Bleeding disorder like hemophilia Fever or infection Guillain-Barre syndrome or other neurological problems Immune system problems Infection with the human immunodeficiency virus (HIV) or AIDS Low blood platelet counts Multiple sclerosis An unusual or  allergic reaction to influenza virus vaccine, latex, other medications, foods, dyes, or preservatives. Different brands of vaccines contain different allergens. Some may contain latex or eggs. Talk to your care team about your allergies to make sure that you get the right vaccine. Pregnant or trying to get pregnant Breastfeeding How should I use this medication? This vaccine is injected into a muscle or under the skin. It is given by your care team. A copy of Vaccine Information Statements will be given before each vaccination. Be sure to read this sheet carefully each time. This sheet may change often. Talk to your care team to see which vaccines are right for you. Some vaccines should not be used in all age groups. Overdosage: If you think you have taken too much of this medicine contact a poison control center or emergency room at once. NOTE: This medicine is only for you. Do not share this medicine with others. What if I miss a dose? This does not apply. What may interact with this medication? Certain medications that lower your immune system, such as etanercept, anakinra, infliximab, adalimumab Certain medications that prevent or treat blood clots, such as warfarin Chemotherapy or radiation therapy Phenytoin Steroid medications, such as prednisone or cortisone Theophylline Vaccines This list may not describe all possible interactions. Give your health care provider a list of all the medicines, herbs, non-prescription drugs, or dietary supplements you use. Also tell them if you smoke, drink alcohol, or use illegal drugs. Some items may interact with your medicine. What should I watch for while using this medication? Report any side effects that do not go away with your care team. Call your care team if any unusual symptoms occur within 6 weeks of receiving this vaccine. You may still catch the flu, but the illness is not usually as bad. You cannot get the flu from the vaccine. The vaccine  will not protect against colds or other illnesses that may cause fever. The vaccine is needed every year. What side effects may I notice from receiving this medication? Side effects that you should report to your care team as soon as possible: Allergic reactions--skin rash, itching, hives, swelling of the face, lips, tongue, or throat Side effects that usually do not require medical attention (report these to your care team if they continue or are bothersome): Chills Fatigue Headache Joint pain Loss of appetite Muscle pain Nausea Pain, redness, or irritation at injection site This list may not describe all possible side effects. Call your doctor for medical advice about side effects. You may report side effects to FDA at 1-800-FDA-1088. Where should I keep  my medication? The vaccine is only given by your care team. It will not be stored at home. NOTE: This sheet is a summary. It may not cover all possible information. If you have questions about this medicine, talk to your doctor, pharmacist, or health care provider.  2023 Elsevier/Gold Standard (2007-07-18 00:00:00)

## 2022-05-09 NOTE — Progress Notes (Signed)
[facility] OFFICE PROGRESS NOTE   Diagnosis: Pancreas cancer  INTERVAL HISTORY:   Ms. Mara completed another cycle of gemcitabine/Abraxane 04/25/2022.  She has increased numbness in the hand.  She has tingling in the hands and feet.  She has chronic foot numbness.  This does not interfere with activity.  She is working.  The skin rash is partially improved.  Objective:  Vital signs in last 24 hours:  Blood pressure 116/77, pulse 73, temperature 98.1 F (36.7 C), temperature source Oral, resp. rate 18, height _0  (1.702 m), weight 185 lb 12.8 oz (84.3 kg), last menstrual period 09/17/2012, SpO2 100 %.    HEENT: No thrush or ulcers Resp: Lungs clear bilaterally Cardio: Good rate and rhythm GI: No hepatosplenomegaly Vascular: No leg edema  Skin: Hyperpigmented rash over the face, trunk, and extremities  Portacath/PICC-without erythema  Lab Results:  Lab Results  Component Value Date   WBC 4.2 05/09/2022   HGB 9.3 (L) 05/09/2022   HCT 29.6 (L) 05/09/2022   MCV 93.7 05/09/2022   PLT 361 05/09/2022   NEUTROABS 2.4 05/09/2022    CMP  Lab Results  Component Value Date   NA 138 05/09/2022   K 3.6 05/09/2022   CL 109 05/09/2022   CO2 22 05/09/2022   GLUCOSE 160 (H) 05/09/2022   BUN 11 05/09/2022   CREATININE 1.08 (H) 05/09/2022   CALCIUM 8.6 (L) 05/09/2022   PROT 7.3 05/09/2022   ALBUMIN 3.8 05/09/2022   AST 28 05/09/2022   ALT 27 05/09/2022   ALKPHOS 148 (H) 05/09/2022   BILITOT 0.5 05/09/2022   GFRNONAA >60 05/09/2022   GFRAA >60 02/07/2020    Lab Results  Component Value Date   ZOX096 71 (H) 03/28/2022    Medications: I have reviewed the patient's current medications.   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 Tumor invades the duodenal wall and vascular groove, resection margins negative, 4/34 lymph nodes positive MSI-stable, tumor showed instability in 2 loci as did  adjacent normal tissue Foundation 1-K-ras G12 V, microsatellite status and tumor mutation burden cannot be determined EUS FNA biopsy of pancreas mass on 07/03/2018-well-differentiated neuroendocrine tumor CTs 01/29/2019-ill-defined pancreas head mass, 5 pulmonary nodules-1 with a small amount of central cavitation, tumor abuts the left margin of the portal vein indistinct density surrounding, hepatic artery, complex cystic lesion of the right kidney, right adrenal mass-characterized as an adenoma on a [facility] MRI 12/21/2018 [facility] 03/03/2019-no focal pancreas activity, no tracer accumulation within the suspicious pulmonary nodules, left uterine mass with tracer accumulation felt to represent a leiomyoma Elevated preoperative CA 19-9--CA 19-9 186 on 01/18/2019 CT chest 04/16/2019-multiple bilateral pulmonary nodules, some with increased cavitation, stable in size Cycle 1 FOLFIRINOX 04/27/2019 Cycle 2 FOLFIRINOX 05/11/2019 Cycle 3 FOLFIRINOX 05/23/2019 Cycle 4 FOLFIRINOX 06/08/2019 Cycle 5 FOLFIRINOX 06/22/2019 CT chest 07/02/2019-stable size of bilateral pulmonary nodules.  Dominant cavitary lesions in both lungs show increased cavitation with thinner walls.  Stable 2.1 cm right adrenal nodule. 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 No definite evidence of metastatic disease in the abdomen or pelvis. Markedly enlarged and heterogeneous appearing uterus, likely to represent multifocal fibroids. 1  of these lesions appears to be an exophytic subserosal fibroid in the posterior lateral aspect of  the uterine body on the left side although this comes in close proximity to the left adnexa such that a primary ovarian lesion is difficult to completely exclude. CTs 02/07/2020-slight enlargement of bilateral lung nodules, some are cavitary, no evidence of metastatic disease in the abdomen or pelvis, stable right adrenal nodule, uterine fibroids CTs 04/26/2020-mild enlargement of pulmonary nodules, slight increase in trace pelvic fluid, new soft tissue thickening inferior to the cecal tip suspicious for peritoneal metastasis CT 05/26/2020-improved appearance of soft tissue at the inferior tip of the cecum, mildly thickened short appendix-findings suggestive of resolving appendicitis, stable small bibasilar pulmonary nodules, fibroids Plan biopsy of right cecal tip soft tissue canceled secondary to radiologic improvement CT chest 08/02/2020-enlargement and progressive cavitation of multiple bilateral lung nodules.  Some new nodules are present. CTs 10/24/2020- increase in size of pulmonary nodules, no new nodules, no evidence of metastatic disease in the abdomen, stable right adrenal nodule CT 01/09/2021-slight interval enlargement of pulmonary nodules, stable right adrenal nodule Navigation bronchoscopy 01/30/2021-left lower lobe cavitary nodule FNA-adenocarcinoma, brushing-adenocarcinoma.  Left lower lobe lavage-adenocarcinoma.  Right upper lobe brushing and FNA biopsy of cavitary nodule-adenocarcinoma-immunohistochemical profile consistent with pancreas adenocarcinoma Cycle 1 gemcitabine/Abraxane 03/28/2021 Cycle 2 gemcitabine/Abraxane 04/11/2021 Cycle 3 gemcitabine/Abraxane 04/25/2021 Cycle 4 gemcitabine/Abraxane 05/09/2021 Cycle 5 gemcitabine/Abraxane 05/23/2021 CT chest 06/05/2021-interval cavitation of multiple pulmonary nodules, some nodules have decreased in size, no new or enlarging nodules Cycle 6 gemcitabine/Abraxane 06/06/2021 Cycle 7 gemcitabine/Abraxane 06/21/2021 Cycle 8 gemcitabine/Abraxane  07/05/2021 Cycle 9 Gemcitabine/Abraxane 07/19/2021 Cycle 10 gemcitabine 08/01/2021-Abraxane held secondary to neuropathy CT chest 08/13/2021-mild decrease in size and wall thickness of multiple cavitary nodules, no new or progressive disease in the chest, indeterminate low-attenuation right liver lesions Cycle 11 gemcitabine 08/16/2021-Abraxane held secondary to neuropathy Cycle 12 gemcitabine 08/30/2021-Abraxane held secondary to neuropathy Cycle 13 gemcitabine 09/13/2021-Abraxane held secondary to neuropathy Cycle 14 gemcitabine 09/27/2021-Abraxane held secondary to neuropathy Cycle 15 gemcitabine 10/11/2021-Abraxane held secondary to neuropathy CTs 10/22/2021-no change in multiple cavitary lung nodules, no evidence of disease progression, ill-defined hypodense lesion in the posterior right liver suspicious for metastatic disease Cycle 16 gemcitabine 10/25/2021 Cycle 17 gemcitabine 11/08/2021 Cycle 18 Gemcitabine 11/22/2021 Cycle 19 gemcitabine 12/06/2021 Cycle 20 Gemcitabine 12/20/2021 CT 12/31/2021-mild increase in size of bilateral pulmonary metastases, stable subtle continuation right liver lesions Cycle 20 gemcitabine/Abraxane 01/03/2022 Cycle 21 gemcitabine/Abraxane 01/17/2022 Cycle 22 gemcitabine/Abraxane 01/31/2022 Cycle 23 gemcitabine/Abraxane 02/14/2022 Cycle 24 gemcitabine/Abraxane 02/28/2022 CTs 03/11/2022-widespread metastatic disease to the lungs again noted with slight involution of several of the pulmonary nodules, no definite new nodules noted; interval cavitation of solid lesion in the posterior aspect right lobe of the liver, no new liver lesions noted. 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 Rozak appears unchanged.  There is no clinical evidence for progression of pancreas cancer.  She has increased in neuropathy symptoms.  Discussed the risk of continuing Abraxane.  She understands the  chance of developing progressive and long-lasting neuropathy symptoms.  Ms. Isaza is comfortable continuing Abraxane for now.  She will be scheduled for a restaging CT evaluation after the treatment on 05/23/2022.  [name], MD  05/09/2022  11:26 AM

## 2022-05-11 LAB — CANCER ANTIGEN 19-9: CA 19-9: 44 U/mL — ABNORMAL HIGH (ref 0–35)

## 2022-05-19 ENCOUNTER — Other Ambulatory Visit: Payer: Self-pay | Admitting: Oncology

## 2022-05-23 ENCOUNTER — Inpatient Hospital Stay (HOSPITAL_BASED_OUTPATIENT_CLINIC_OR_DEPARTMENT_OTHER): Payer: BC Managed Care – PPO | Admitting: Nurse Practitioner

## 2022-05-23 ENCOUNTER — Inpatient Hospital Stay: Payer: BC Managed Care – PPO | Attending: Genetic Counselor

## 2022-05-23 ENCOUNTER — Encounter: Payer: Self-pay | Admitting: Nurse Practitioner

## 2022-05-23 ENCOUNTER — Inpatient Hospital Stay: Payer: BC Managed Care – PPO

## 2022-05-23 VITALS — BP 129/78 | HR 70 | Temp 98.1°F | Resp 20 | Ht 67.0 in | Wt 187.6 lb

## 2022-05-23 VITALS — BP 111/67 | HR 57

## 2022-05-23 DIAGNOSIS — C7801 Secondary malignant neoplasm of right lung: Secondary | ICD-10-CM | POA: Diagnosis not present

## 2022-05-23 DIAGNOSIS — C7802 Secondary malignant neoplasm of left lung: Secondary | ICD-10-CM | POA: Diagnosis not present

## 2022-05-23 DIAGNOSIS — C250 Malignant neoplasm of head of pancreas: Secondary | ICD-10-CM

## 2022-05-23 DIAGNOSIS — Z5111 Encounter for antineoplastic chemotherapy: Secondary | ICD-10-CM | POA: Diagnosis not present

## 2022-05-23 DIAGNOSIS — G629 Polyneuropathy, unspecified: Secondary | ICD-10-CM | POA: Insufficient documentation

## 2022-05-23 DIAGNOSIS — Z905 Acquired absence of kidney: Secondary | ICD-10-CM | POA: Insufficient documentation

## 2022-05-23 DIAGNOSIS — Z79899 Other long term (current) drug therapy: Secondary | ICD-10-CM | POA: Diagnosis not present

## 2022-05-23 DIAGNOSIS — E119 Type 2 diabetes mellitus without complications: Secondary | ICD-10-CM | POA: Diagnosis not present

## 2022-05-23 DIAGNOSIS — R21 Rash and other nonspecific skin eruption: Secondary | ICD-10-CM | POA: Diagnosis not present

## 2022-05-23 DIAGNOSIS — I10 Essential (primary) hypertension: Secondary | ICD-10-CM | POA: Insufficient documentation

## 2022-05-23 LAB — CBC WITH DIFFERENTIAL (CANCER CENTER ONLY)
Abs Immature Granulocytes: 0.01 10*3/uL (ref 0.00–0.07)
Basophils Absolute: 0 10*3/uL (ref 0.0–0.1)
Basophils Relative: 1 %
Eosinophils Absolute: 0.1 10*3/uL (ref 0.0–0.5)
Eosinophils Relative: 2 %
HCT: 27.7 % — ABNORMAL LOW (ref 36.0–46.0)
Hemoglobin: 8.7 g/dL — ABNORMAL LOW (ref 12.0–15.0)
Immature Granulocytes: 0 %
Lymphocytes Relative: 25 %
Lymphs Abs: 1 10*3/uL (ref 0.7–4.0)
MCH: 29 pg (ref 26.0–34.0)
MCHC: 31.4 g/dL (ref 30.0–36.0)
MCV: 92.3 fL (ref 80.0–100.0)
Monocytes Absolute: 0.5 10*3/uL (ref 0.1–1.0)
Monocytes Relative: 14 %
Neutro Abs: 2.3 10*3/uL (ref 1.7–7.7)
Neutrophils Relative %: 58 %
Platelet Count: 233 10*3/uL (ref 150–400)
RBC: 3 MIL/uL — ABNORMAL LOW (ref 3.87–5.11)
RDW: 14.8 % (ref 11.5–15.5)
WBC Count: 3.9 10*3/uL — ABNORMAL LOW (ref 4.0–10.5)
nRBC: 0 % (ref 0.0–0.2)

## 2022-05-23 LAB — CMP (CANCER CENTER ONLY)
ALT: 22 U/L (ref 0–44)
AST: 25 U/L (ref 15–41)
Albumin: 3.6 g/dL (ref 3.5–5.0)
Alkaline Phosphatase: 140 U/L — ABNORMAL HIGH (ref 38–126)
Anion gap: 9 (ref 5–15)
BUN: 6 mg/dL (ref 6–20)
CO2: 21 mmol/L — ABNORMAL LOW (ref 22–32)
Calcium: 8.6 mg/dL — ABNORMAL LOW (ref 8.9–10.3)
Chloride: 108 mmol/L (ref 98–111)
Creatinine: 1 mg/dL (ref 0.44–1.00)
GFR, Estimated: >60 mL/min (ref >60)
Glucose, Bld: 135 mg/dL — ABNORMAL HIGH (ref 70–99)
Potassium: 3.7 mmol/L (ref 3.5–5.1)
Sodium: 138 mmol/L (ref 135–145)
Total Bilirubin: 0.5 mg/dL (ref 0.3–1.2)
Total Protein: 6.9 g/dL (ref 6.5–8.1)

## 2022-05-23 LAB — MAGNESIUM: Magnesium: 1.6 mg/dL — ABNORMAL LOW (ref 1.7–2.4)

## 2022-05-23 MED ORDER — SODIUM CHLORIDE 0.9 % IV SOLN: Freq: Once | INTRAVENOUS | Status: AC

## 2022-05-23 MED ORDER — PROCHLORPERAZINE MALEATE 10 MG PO TABS
10.0000 mg | ORAL_TABLET | Freq: Once | ORAL | Status: AC
  Administered 2022-05-23: 10 mg via ORAL
  Filled 2022-05-23: qty 1

## 2022-05-23 MED ORDER — SODIUM CHLORIDE 0.9% FLUSH
10.0000 mL | INTRAVENOUS | Status: DC | PRN
  Administered 2022-05-23: 10 mL

## 2022-05-23 MED ORDER — HEPARIN SOD (PORK) LOCK FLUSH 100 UNIT/ML IV SOLN
500.0000 [IU] | Freq: Once | INTRAVENOUS | Status: AC | PRN
  Administered 2022-05-23: 500 [IU]

## 2022-05-23 MED ORDER — SODIUM CHLORIDE 0.9 % IV SOLN
2000.0000 mg | Freq: Once | INTRAVENOUS | Status: AC
  Administered 2022-05-23: 2000 mg via INTRAVENOUS
  Filled 2022-05-23: qty 52.6

## 2022-05-23 NOTE — Patient Instructions (Signed)
[facility]   Discharge Instructions: Thank you for choosing [facility] to provide your oncology and hematology care.   If you have a lab appointment with the [facility], please go directly to the [facility] and check in at the registration area.   Wear comfortable clothing and clothing appropriate for easy access to any Portacath or PICC line.   We strive to give you quality time with your provider. You may need to reschedule your appointment if you arrive late (15 or more minutes).  Arriving late affects you and other patients whose appointments are after yours.  Also, if you miss three or more appointments without notifying the office, you may be dismissed from the clinic at the provider's discretion.      For prescription refill requests, have your pharmacy contact our office and allow 72 hours for refills to be completed.    Today you received the following chemotherapy and/or immunotherapy agents Gemzar      To help prevent nausea and vomiting after your treatment, we encourage you to take your nausea medication as directed.  BELOW ARE SYMPTOMS THAT SHOULD BE REPORTED IMMEDIATELY: *FEVER GREATER THAN 100.4 F (38 C) OR HIGHER *CHILLS OR SWEATING *NAUSEA AND VOMITING THAT IS NOT CONTROLLED WITH YOUR NAUSEA MEDICATION *UNUSUAL SHORTNESS OF BREATH *UNUSUAL BRUISING OR BLEEDING *URINARY PROBLEMS (pain or burning when urinating, or frequent urination) *BOWEL PROBLEMS (unusual diarrhea, constipation, pain near the anus) TENDERNESS IN MOUTH AND THROAT WITH OR WITHOUT PRESENCE OF ULCERS (sore throat, sores in mouth, or a toothache) UNUSUAL RASH, SWELLING OR PAIN  UNUSUAL VAGINAL DISCHARGE OR ITCHING   Items with * indicate a potential emergency and should be followed up as soon as possible or go to the Emergency Department if any problems should occur.  Please show the CHEMOTHERAPY ALERT CARD or IMMUNOTHERAPY ALERT CARD at check-in to the  Emergency Department and triage nurse.  Should you have questions after your visit or need to cancel or reschedule your appointment, please contact [facility]  Dept: [phone]  and follow the prompts.  Office hours are 8:00 a.m. to 4:30 p.m. Monday - Friday. Please note that voicemails left after 4:00 p.m. may not be returned until the following business day.  We are closed weekends and major holidays. You have access to a nurse at all times for urgent questions. Please call the main number to the clinic Dept: [phone] and follow the prompts.   For any non-urgent questions, you may also contact your provider using MyChart. We now offer e-Visits for anyone 33 and older to request care online for non-urgent symptoms. For details visit [email or web address].   Also download the MyChart app! Go to the app store, search "MyChart", open the app, select [facility], and log in with your MyChart username and password.  Masks are optional in the cancer centers. If you would like for your care team to wear a mask while they are taking care of you, please let them know. You may have one support person who is at least 56 years old accompany you for your appointments.  Gemcitabine Injection What is this medication? GEMCITABINE (jem SYE ta been) treats some types of cancer. It works by slowing down the growth of cancer cells. This medicine may be used for other purposes; ask your health care provider or pharmacist if you have questions. COMMON BRAND NAME(S): Gemzar, Infugem What should I tell my care team before I take  this medication? They need to know if you have any of these conditions: Blood disorders Infection Kidney disease Liver disease Lung or breathing disease, such as asthma or COPD Recent or ongoing radiation therapy An unusual or allergic reaction to gemcitabine, other medications, foods, dyes, or preservatives If you or your partner are pregnant or trying  to get pregnant Breast-feeding How should I use this medication? This medication is injected into a vein. It is given by your care team in a hospital or clinic setting. Talk to your care team about the use of this medication in children. Special care may be needed. Overdosage: If you think you have taken too much of this medicine contact a poison control center or emergency room at once. NOTE: This medicine is only for you. Do not share this medicine with others. What if I miss a dose? Keep appointments for follow-up doses. It is important not to miss your dose. Call your care team if you are unable to keep an appointment. What may interact with this medication? Interactions have not been studied. This list may not describe all possible interactions. Give your health care provider a list of all the medicines, herbs, non-prescription drugs, or dietary supplements you use. Also tell them if you smoke, drink alcohol, or use illegal drugs. Some items may interact with your medicine. What should I watch for while using this medication? Your condition will be monitored carefully while you are receiving this medication. This medication may make you feel generally unwell. This is not uncommon, as chemotherapy can affect healthy cells as well as cancer cells. Report any side effects. Continue your course of treatment even though you feel ill unless your care team tells you to stop. In some cases, you may be given additional medications to help with side effects. Follow all directions for their use. This medication may increase your risk of getting an infection. Call your care team for advice if you get a fever, chills, sore throat, or other symptoms of a cold or flu. Do not treat yourself. Try to avoid being around people who are sick. This medication may increase your risk to bruise or bleed. Call your care team if you notice any unusual bleeding. Be careful brushing or flossing your teeth or using a  toothpick because you may get an infection or bleed more easily. If you have any dental work done, tell your dentist you are receiving this medication. Avoid taking medications that contain aspirin, acetaminophen, ibuprofen, naproxen, or ketoprofen unless instructed by your care team. These medications may hide a fever. Talk to your care team if you or your partner wish to become pregnant or think you might be pregnant. This medication can cause serious birth defects if taken during pregnancy and for 6 months after the last dose. A negative pregnancy test is required before starting this medication. A reliable form of contraception is recommended while taking this medication and for 6 months after the last dose. Talk to your care team about effective forms of contraception. Do not father a child while taking this medication and for 3 months after the last dose. Use a condom while having sex during this time period. Do not breastfeed while taking this medication and for at least 1 week after the last dose. This medication may cause infertility. Talk to your care team if you are concerned about your fertility. What side effects may I notice from receiving this medication? Side effects that you should report to your care team as  soon as possible: Allergic reactions--skin rash, itching, hives, swelling of the face, lips, tongue, or throat Capillary leak syndrome--stomach or muscle pain, unusual weakness or fatigue, feeling faint or lightheaded, decrease in the amount of urine, swelling of the ankles, hands, or feet, trouble breathing Infection--fever, chills, cough, sore throat, wounds that don't heal, pain or trouble when passing urine, general feeling of discomfort or being unwell Liver injury--right upper belly pain, loss of appetite, nausea, light-colored stool, dark yellow or brown urine, yellowing skin or eyes, unusual weakness or fatigue Low red blood cell level--unusual weakness or fatigue, dizziness,  headache, trouble breathing Lung injury--shortness of breath or trouble breathing, cough, spitting up blood, chest pain, fever Stomach pain, bloody diarrhea, pale skin, unusual weakness or fatigue, decrease in the amount of urine, which may be signs of hemolytic uremic syndrome Sudden and severe headache, confusion, change in vision, seizures, which may be signs of posterior reversible encephalopathy syndrome (PRES) Unusual bruising or bleeding Side effects that usually do not require medical attention (report to your care team if they continue or are bothersome): Diarrhea Drowsiness Hair loss Nausea Pain, redness, or swelling with sores inside the mouth or throat Vomiting This list may not describe all possible side effects. Call your doctor for medical advice about side effects. You may report side effects to FDA at 1-800-FDA-1088. Where should I keep my medication? This medication is given in a hospital or clinic. It will not be stored at home. NOTE: This sheet is a summary. It may not cover all possible information. If you have questions about this medicine, talk to your doctor, pharmacist, or health care provider.  2023 Elsevier/Gold Standard (2007-07-18 00:00:00)

## 2022-05-23 NOTE — Progress Notes (Signed)
[facility] OFFICE PROGRESS NOTE   Diagnosis: Pancreas cancer  INTERVAL HISTORY:   Alyssa Ross returns as scheduled.  She completed another cycle of gemcitabine/Abraxane 05/09/2022.  She denies nausea/vomiting.  No mouth sores.  No diarrhea.  No fever after treatment.  Rash on her face has increased.  She reports neuropathy symptoms have worsened over the past 2 weeks.  At times she notes symptoms involving the lower arms and lower legs.  Objective:  Vital signs in last 24 hours:  Blood pressure 129/78, pulse 70, temperature 98.1 F (36.7 C), temperature source Oral, resp. rate 20, height 5' 7" (1.702 m), weight 187 lb 9.6 oz (85.1 kg), last menstrual period 09/17/2012, SpO2 100 %.    HEENT: No thrush or ulcers. Resp: Lungs clear bilaterally. Cardio: Regular rate and rhythm. GI: Abdomen soft and nontender.  No hepatosplenomegaly. Vascular: No leg edema. Skin: Hyperpigmented rash scattered over the face, less so upper chest and upper back. Port-A-Cath without erythema.  Lab Results:  Lab Results  Component Value Date   WBC 3.9 (L) 05/23/2022   HGB 8.7 (L) 05/23/2022   HCT 27.7 (L) 05/23/2022   MCV 92.3 05/23/2022   PLT 233 05/23/2022   NEUTROABS 2.3 05/23/2022    Imaging:  No results found.  Medications: I have reviewed the patient's current medications.  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 Tumor invades the duodenal wall and vascular groove, resection margins negative, 4/34 lymph nodes positive MSI-stable, tumor showed instability in 2 loci as did adjacent normal tissue Foundation 1-K-ras G12 V, microsatellite status and tumor mutation burden cannot be determined EUS FNA biopsy of pancreas mass on 07/03/2018-well-differentiated neuroendocrine tumor CTs 01/29/2019-ill-defined pancreas head mass, 5 pulmonary nodules-1 with a small amount of central cavitation, tumor abuts the left margin of  the portal vein indistinct density surrounding, hepatic artery, complex cystic lesion of the right kidney, right adrenal mass-characterized as an adenoma on a [facility] MRI 12/21/2018 [facility] 03/03/2019-no focal pancreas activity, no tracer accumulation within the suspicious pulmonary nodules, left uterine mass with tracer accumulation felt to represent a leiomyoma Elevated preoperative CA 19-9--CA 19-9 186 on 01/18/2019 CT chest 04/16/2019-multiple bilateral pulmonary nodules, some with increased cavitation, stable in size Cycle 1 FOLFIRINOX 04/27/2019 Cycle 2 FOLFIRINOX 05/11/2019 Cycle 3 FOLFIRINOX 05/23/2019 Cycle 4 FOLFIRINOX 06/08/2019 Cycle 5 FOLFIRINOX 06/22/2019 CT chest 07/02/2019-stable size of bilateral pulmonary nodules.  Dominant cavitary lesions in both lungs show increased cavitation with thinner walls.  Stable 2.1 cm right adrenal nodule. 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 No definite evidence of metastatic disease in the abdomen or pelvis. Markedly enlarged and heterogeneous appearing uterus, likely to represent multifocal fibroids. 1 of these lesions appears to be an exophytic subserosal fibroid in the posterior lateral aspect of the uterine body on the left side although this comes in close proximity to the left adnexa such that a primary ovarian lesion is difficult to completely exclude. CTs 02/07/2020-slight enlargement of bilateral lung nodules, some are cavitary, no evidence of metastatic disease in the abdomen or pelvis, stable right adrenal nodule, uterine  fibroids CTs 04/26/2020-mild enlargement of pulmonary nodules, slight increase in trace pelvic fluid,  new soft tissue thickening inferior to the cecal tip suspicious for peritoneal metastasis CT 05/26/2020-improved appearance of soft tissue at the inferior tip of the cecum, mildly thickened short appendix-findings suggestive of resolving appendicitis, stable small bibasilar pulmonary nodules, fibroids Plan biopsy of right cecal tip soft tissue canceled secondary to radiologic improvement CT chest 08/02/2020-enlargement and progressive cavitation of multiple bilateral lung nodules.  Some new nodules are present. CTs 10/24/2020- increase in size of pulmonary nodules, no new nodules, no evidence of metastatic disease in the abdomen, stable right adrenal nodule CT 01/09/2021-slight interval enlargement of pulmonary nodules, stable right adrenal nodule Navigation bronchoscopy 01/30/2021-left lower lobe cavitary nodule FNA-adenocarcinoma, brushing-adenocarcinoma.  Left lower lobe lavage-adenocarcinoma.  Right upper lobe brushing and FNA biopsy of cavitary nodule-adenocarcinoma-immunohistochemical profile consistent with pancreas adenocarcinoma Cycle 1 gemcitabine/Abraxane 03/28/2021 Cycle 2 gemcitabine/Abraxane 04/11/2021 Cycle 3 gemcitabine/Abraxane 04/25/2021 Cycle 4 gemcitabine/Abraxane 05/09/2021 Cycle 5 gemcitabine/Abraxane 05/23/2021 CT chest 06/05/2021-interval cavitation of multiple pulmonary nodules, some nodules have decreased in size, no new or enlarging nodules Cycle 6 gemcitabine/Abraxane 06/06/2021 Cycle 7 gemcitabine/Abraxane 06/21/2021 Cycle 8 gemcitabine/Abraxane 07/05/2021 Cycle 9 Gemcitabine/Abraxane 07/19/2021 Cycle 10 gemcitabine 08/01/2021-Abraxane held secondary to neuropathy CT chest 08/13/2021-mild decrease in size and wall thickness of multiple cavitary nodules, no new or progressive disease in the chest, indeterminate low-attenuation right liver lesions Cycle 11 gemcitabine  08/16/2021-Abraxane held secondary to neuropathy Cycle 12 gemcitabine 08/30/2021-Abraxane held secondary to neuropathy Cycle 13 gemcitabine 09/13/2021-Abraxane held secondary to neuropathy Cycle 14 gemcitabine 09/27/2021-Abraxane held secondary to neuropathy Cycle 15 gemcitabine 10/11/2021-Abraxane held secondary to neuropathy CTs 10/22/2021-no change in multiple cavitary lung nodules, no evidence of disease progression, ill-defined hypodense lesion in the posterior right liver suspicious for metastatic disease Cycle 16 gemcitabine 10/25/2021 Cycle 17 gemcitabine 11/08/2021 Cycle 18 Gemcitabine 11/22/2021 Cycle 19 gemcitabine 12/06/2021 Cycle 20 Gemcitabine 12/20/2021 CT 12/31/2021-mild increase in size of bilateral pulmonary metastases, stable subtle continuation right liver lesions Cycle 20 gemcitabine/Abraxane 01/03/2022 Cycle 21 gemcitabine/Abraxane 01/17/2022 Cycle 22 gemcitabine/Abraxane 01/31/2022 Cycle 23 gemcitabine/Abraxane 02/14/2022 Cycle 24 gemcitabine/Abraxane 02/28/2022 CTs 03/11/2022-widespread metastatic disease to the lungs again noted with slight involution of several of the pulmonary nodules, no definite new nodules noted; interval cavitation of solid lesion in the posterior aspect right lobe of the liver, no new liver lesions noted. 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  She is on active treatment with gemcitabine/Abraxane every 2 weeks.  Neuropathy symptoms have increased further.  We decided to hold Abraxane with today's treatment, proceed with gemcitabine alone.  Restaging CTs prior to next office visit.  CBC and chemistry panel reviewed.  Labs adequate to proceed as above.  Magnesium level is mildly decreased.  We discussed IV magnesium.  She declines this today but will consider an infusion if the magnesium level is low in 2 weeks.  She will return for follow-up in 2 weeks.  Alyssa Ross ANP/GNP-BC   05/23/2022  10:56 AM

## 2022-05-25 LAB — CANCER ANTIGEN 19-9: CA 19-9: 38 U/mL — ABNORMAL HIGH (ref 0–35)

## 2022-05-30 ENCOUNTER — Other Ambulatory Visit: Payer: Self-pay | Admitting: Oncology

## 2022-06-03 ENCOUNTER — Other Ambulatory Visit: Payer: Self-pay | Admitting: Oncology

## 2022-06-03 DIAGNOSIS — C250 Malignant neoplasm of head of pancreas: Secondary | ICD-10-CM

## 2022-06-04 ENCOUNTER — Ambulatory Visit (HOSPITAL_BASED_OUTPATIENT_CLINIC_OR_DEPARTMENT_OTHER)
Admission: RE | Admit: 2022-06-04 | Discharge: 2022-06-04 | Disposition: A | Discharge status: Home / Self Care | Payer: BC Managed Care – PPO | Source: Ambulatory Visit | Attending: Nurse Practitioner | Admitting: Nurse Practitioner

## 2022-06-04 DIAGNOSIS — R918 Other nonspecific abnormal finding of lung field: Secondary | ICD-10-CM | POA: Diagnosis not present

## 2022-06-04 DIAGNOSIS — N2889 Other specified disorders of kidney and ureter: Secondary | ICD-10-CM | POA: Diagnosis not present

## 2022-06-04 DIAGNOSIS — C250 Malignant neoplasm of head of pancreas: Secondary | ICD-10-CM | POA: Diagnosis not present

## 2022-06-04 MED ORDER — IOHEXOL 300 MG/ML  SOLN
100.0000 mL | Freq: Once | INTRAMUSCULAR | Status: AC | PRN
  Administered 2022-06-04: 90 mL via INTRAVENOUS

## 2022-06-06 ENCOUNTER — Inpatient Hospital Stay (HOSPITAL_BASED_OUTPATIENT_CLINIC_OR_DEPARTMENT_OTHER): Payer: BC Managed Care – PPO | Admitting: Nurse Practitioner

## 2022-06-06 ENCOUNTER — Encounter: Payer: Self-pay | Admitting: *Deleted

## 2022-06-06 ENCOUNTER — Inpatient Hospital Stay: Payer: BC Managed Care – PPO

## 2022-06-06 ENCOUNTER — Encounter: Payer: Self-pay | Admitting: Nurse Practitioner

## 2022-06-06 VITALS — BP 106/74 | HR 54

## 2022-06-06 VITALS — BP 121/75 | HR 65 | Temp 98.2°F | Resp 18 | Wt 186.6 lb

## 2022-06-06 DIAGNOSIS — Z79899 Other long term (current) drug therapy: Secondary | ICD-10-CM | POA: Diagnosis not present

## 2022-06-06 DIAGNOSIS — E119 Type 2 diabetes mellitus without complications: Secondary | ICD-10-CM | POA: Diagnosis not present

## 2022-06-06 DIAGNOSIS — I10 Essential (primary) hypertension: Secondary | ICD-10-CM | POA: Diagnosis not present

## 2022-06-06 DIAGNOSIS — C250 Malignant neoplasm of head of pancreas: Secondary | ICD-10-CM | POA: Diagnosis not present

## 2022-06-06 DIAGNOSIS — C7801 Secondary malignant neoplasm of right lung: Secondary | ICD-10-CM | POA: Diagnosis not present

## 2022-06-06 DIAGNOSIS — Z5111 Encounter for antineoplastic chemotherapy: Secondary | ICD-10-CM | POA: Diagnosis not present

## 2022-06-06 DIAGNOSIS — G629 Polyneuropathy, unspecified: Secondary | ICD-10-CM | POA: Diagnosis not present

## 2022-06-06 DIAGNOSIS — Z905 Acquired absence of kidney: Secondary | ICD-10-CM | POA: Diagnosis not present

## 2022-06-06 DIAGNOSIS — C7802 Secondary malignant neoplasm of left lung: Secondary | ICD-10-CM | POA: Diagnosis not present

## 2022-06-06 DIAGNOSIS — R21 Rash and other nonspecific skin eruption: Secondary | ICD-10-CM | POA: Diagnosis not present

## 2022-06-06 LAB — CBC WITH DIFFERENTIAL (CANCER CENTER ONLY)
Abs Immature Granulocytes: 0 10*3/uL (ref 0.00–0.07)
Basophils Absolute: 0 10*3/uL (ref 0.0–0.1)
Basophils Relative: 0 %
Eosinophils Absolute: 0.1 10*3/uL (ref 0.0–0.5)
Eosinophils Relative: 2 %
HCT: 29.2 % — ABNORMAL LOW (ref 36.0–46.0)
Hemoglobin: 9.2 g/dL — ABNORMAL LOW (ref 12.0–15.0)
Immature Granulocytes: 0 %
Lymphocytes Relative: 19 %
Lymphs Abs: 0.9 10*3/uL (ref 0.7–4.0)
MCH: 29.4 pg (ref 26.0–34.0)
MCHC: 31.5 g/dL (ref 30.0–36.0)
MCV: 93.3 fL (ref 80.0–100.0)
Monocytes Absolute: 0.5 10*3/uL (ref 0.1–1.0)
Monocytes Relative: 11 %
Neutro Abs: 3.3 10*3/uL (ref 1.7–7.7)
Neutrophils Relative %: 68 %
Platelet Count: 314 10*3/uL (ref 150–400)
RBC: 3.13 MIL/uL — ABNORMAL LOW (ref 3.87–5.11)
RDW: 15.3 % (ref 11.5–15.5)
WBC Count: 4.8 10*3/uL (ref 4.0–10.5)
nRBC: 0 % (ref 0.0–0.2)

## 2022-06-06 LAB — CMP (CANCER CENTER ONLY)
ALT: 29 U/L (ref 0–44)
AST: 23 U/L (ref 15–41)
Albumin: 3.6 g/dL (ref 3.5–5.0)
Alkaline Phosphatase: 149 U/L — ABNORMAL HIGH (ref 38–126)
Anion gap: 6 (ref 5–15)
BUN: 9 mg/dL (ref 6–20)
CO2: 23 mmol/L (ref 22–32)
Calcium: 8.7 mg/dL — ABNORMAL LOW (ref 8.9–10.3)
Chloride: 109 mmol/L (ref 98–111)
Creatinine: 1.15 mg/dL — ABNORMAL HIGH (ref 0.44–1.00)
GFR, Estimated: 56 mL/min — ABNORMAL LOW (ref >60)
Glucose, Bld: 264 mg/dL — ABNORMAL HIGH (ref 70–99)
Potassium: 3.5 mmol/L (ref 3.5–5.1)
Sodium: 138 mmol/L (ref 135–145)
Total Bilirubin: 0.4 mg/dL (ref 0.3–1.2)
Total Protein: 7 g/dL (ref 6.5–8.1)

## 2022-06-06 LAB — MAGNESIUM: Magnesium: 1.7 mg/dL (ref 1.7–2.4)

## 2022-06-06 MED ORDER — SODIUM CHLORIDE 0.9 % IV SOLN
2000.0000 mg | Freq: Once | INTRAVENOUS | Status: AC
  Administered 2022-06-06: 2000 mg via INTRAVENOUS
  Filled 2022-06-06: qty 52.6

## 2022-06-06 MED ORDER — PROCHLORPERAZINE MALEATE 10 MG PO TABS
10.0000 mg | ORAL_TABLET | Freq: Once | ORAL | Status: AC
  Administered 2022-06-06: 10 mg via ORAL
  Filled 2022-06-06: qty 1

## 2022-06-06 MED ORDER — HEPARIN SOD (PORK) LOCK FLUSH 100 UNIT/ML IV SOLN
500.0000 [IU] | Freq: Once | INTRAVENOUS | Status: AC | PRN
  Administered 2022-06-06: 500 [IU]

## 2022-06-06 MED ORDER — SODIUM CHLORIDE 0.9% FLUSH
10.0000 mL | INTRAVENOUS | Status: DC | PRN
  Administered 2022-06-06: 10 mL

## 2022-06-06 MED ORDER — SODIUM CHLORIDE 0.9 % IV SOLN: Freq: Once | INTRAVENOUS | Status: AC

## 2022-06-06 NOTE — Progress Notes (Signed)
A user error has taken place: encounter opened in error, closed for administrative reasons.

## 2022-06-06 NOTE — Patient Instructions (Signed)
[facility]   Discharge Instructions: Thank you for choosing [facility] to provide your oncology and hematology care.   If you have a lab appointment with the [facility], please go directly to the [facility] and check in at the registration area.   Wear comfortable clothing and clothing appropriate for easy access to any Portacath or PICC line.   We strive to give you quality time with your provider. You may need to reschedule your appointment if you arrive late (15 or more minutes).  Arriving late affects you and other patients whose appointments are after yours.  Also, if you miss three or more appointments without notifying the office, you may be dismissed from the clinic at the provider's discretion.      For prescription refill requests, have your pharmacy contact our office and allow 72 hours for refills to be completed.    Today you received the following chemotherapy and/or immunotherapy agents Gemzar       To help prevent nausea and vomiting after your treatment, we encourage you to take your nausea medication as directed.  BELOW ARE SYMPTOMS THAT SHOULD BE REPORTED IMMEDIATELY: *FEVER GREATER THAN 100.4 F (38 C) OR HIGHER *CHILLS OR SWEATING *NAUSEA AND VOMITING THAT IS NOT CONTROLLED WITH YOUR NAUSEA MEDICATION *UNUSUAL SHORTNESS OF BREATH *UNUSUAL BRUISING OR BLEEDING *URINARY PROBLEMS (pain or burning when urinating, or frequent urination) *BOWEL PROBLEMS (unusual diarrhea, constipation, pain near the anus) TENDERNESS IN MOUTH AND THROAT WITH OR WITHOUT PRESENCE OF ULCERS (sore throat, sores in mouth, or a toothache) UNUSUAL RASH, SWELLING OR PAIN  UNUSUAL VAGINAL DISCHARGE OR ITCHING   Items with * indicate a potential emergency and should be followed up as soon as possible or go to the Emergency Department if any problems should occur.  Please show the CHEMOTHERAPY ALERT CARD or IMMUNOTHERAPY ALERT CARD at check-in to the  Emergency Department and triage nurse.  Should you have questions after your visit or need to cancel or reschedule your appointment, please contact [facility]  Dept: [phone]  and follow the prompts.  Office hours are 8:00 a.m. to 4:30 p.m. Monday - Friday. Please note that voicemails left after 4:00 p.m. may not be returned until the following business day.  We are closed weekends and major holidays. You have access to a nurse at all times for urgent questions. Please call the main number to the clinic Dept: [phone] and follow the prompts.   For any non-urgent questions, you may also contact your provider using MyChart. We now offer e-Visits for anyone 62 and older to request care online for non-urgent symptoms. For details visit [email or web address].   Also download the MyChart app! Go to the app store, search "MyChart", open the app, select [facility], and log in with your MyChart username and password.  Gemcitabine Injection What is this medication? GEMCITABINE (jem SYE ta been) treats some types of cancer. It works by slowing down the growth of cancer cells. This medicine may be used for other purposes; ask your health care provider or pharmacist if you have questions. COMMON BRAND NAME(S): Gemzar, Infugem What should I tell my care team before I take this medication? They need to know if you have any of these conditions: Blood disorders Infection Kidney disease Liver disease Lung or breathing disease, such as asthma or COPD Recent or ongoing radiation therapy An unusual or allergic reaction to gemcitabine, other medications, foods, dyes, or preservatives If  you or your partner are pregnant or trying to get pregnant Breast-feeding How should I use this medication? This medication is injected into a vein. It is given by your care team in a hospital or clinic setting. Talk to your care team about the use of this medication in children. Special  care may be needed. Overdosage: If you think you have taken too much of this medicine contact a poison control center or emergency room at once. NOTE: This medicine is only for you. Do not share this medicine with others. What if I miss a dose? Keep appointments for follow-up doses. It is important not to miss your dose. Call your care team if you are unable to keep an appointment. What may interact with this medication? Interactions have not been studied. This list may not describe all possible interactions. Give your health care provider a list of all the medicines, herbs, non-prescription drugs, or dietary supplements you use. Also tell them if you smoke, drink alcohol, or use illegal drugs. Some items may interact with your medicine. What should I watch for while using this medication? Your condition will be monitored carefully while you are receiving this medication. This medication may make you feel generally unwell. This is not uncommon, as chemotherapy can affect healthy cells as well as cancer cells. Report any side effects. Continue your course of treatment even though you feel ill unless your care team tells you to stop. In some cases, you may be given additional medications to help with side effects. Follow all directions for their use. This medication may increase your risk of getting an infection. Call your care team for advice if you get a fever, chills, sore throat, or other symptoms of a cold or flu. Do not treat yourself. Try to avoid being around people who are sick. This medication may increase your risk to bruise or bleed. Call your care team if you notice any unusual bleeding. Be careful brushing or flossing your teeth or using a toothpick because you may get an infection or bleed more easily. If you have any dental work done, tell your dentist you are receiving this medication. Avoid taking medications that contain aspirin, acetaminophen, ibuprofen, naproxen, or ketoprofen  unless instructed by your care team. These medications may hide a fever. Talk to your care team if you or your partner wish to become pregnant or think you might be pregnant. This medication can cause serious birth defects if taken during pregnancy and for 6 months after the last dose. A negative pregnancy test is required before starting this medication. A reliable form of contraception is recommended while taking this medication and for 6 months after the last dose. Talk to your care team about effective forms of contraception. Do not father a child while taking this medication and for 3 months after the last dose. Use a condom while having sex during this time period. Do not breastfeed while taking this medication and for at least 1 week after the last dose. This medication may cause infertility. Talk to your care team if you are concerned about your fertility. What side effects may I notice from receiving this medication? Side effects that you should report to your care team as soon as possible: Allergic reactions--skin rash, itching, hives, swelling of the face, lips, tongue, or throat Capillary leak syndrome--stomach or muscle pain, unusual weakness or fatigue, feeling faint or lightheaded, decrease in the amount of urine, swelling of the ankles, hands, or feet, trouble breathing Infection--fever, chills, cough,  sore throat, wounds that don't heal, pain or trouble when passing urine, general feeling of discomfort or being unwell Liver injury--right upper belly pain, loss of appetite, nausea, light-colored stool, dark yellow or brown urine, yellowing skin or eyes, unusual weakness or fatigue Low red blood cell level--unusual weakness or fatigue, dizziness, headache, trouble breathing Lung injury--shortness of breath or trouble breathing, cough, spitting up blood, chest pain, fever Stomach pain, bloody diarrhea, pale skin, unusual weakness or fatigue, decrease in the amount of urine, which may be  signs of hemolytic uremic syndrome Sudden and severe headache, confusion, change in vision, seizures, which may be signs of posterior reversible encephalopathy syndrome (PRES) Unusual bruising or bleeding Side effects that usually do not require medical attention (report to your care team if they continue or are bothersome): Diarrhea Drowsiness Hair loss Nausea Pain, redness, or swelling with sores inside the mouth or throat Vomiting This list may not describe all possible side effects. Call your doctor for medical advice about side effects. You may report side effects to FDA at 1-800-FDA-1088. Where should I keep my medication? This medication is given in a hospital or clinic. It will not be stored at home. NOTE: This sheet is a summary. It may not cover all possible information. If you have questions about this medicine, talk to your doctor, pharmacist, or health care provider.  2023 Elsevier/Gold Standard (2007-07-18 00:00:00)

## 2022-06-06 NOTE — Progress Notes (Signed)
Patient seen by Lisa [name] NP today  Vitals are within treatment parameters.  Labs reviewed by Lisa [name] NP and are within treatment parameters.  Per physician team, patient is ready for treatment and there are NO modifications to the treatment plan.     

## 2022-06-06 NOTE — Progress Notes (Signed)
PATIENT NAVIGATOR PROGRESS NOTE  Name: Deisha Stull Date: 06/06/2022 MRN: [id number]  DOB: January 23, 1966   Reason for visit:  F/U visit with Ned Card NP  Comments:   CT scan report faxed to [facility] at [phone] as well as requested CD of images to be sent via FED- EX     Time spent counseling/coordinating care: 30-45 minutes

## 2022-06-06 NOTE — Progress Notes (Signed)
[facility] OFFICE PROGRESS NOTE   Diagnosis: Pancreas cancer  INTERVAL HISTORY:   Alyssa Ross returns as scheduled.  She completed a cycle of gemcitabine 05/23/2022.  Abraxane was held due to increased neuropathy.  Same neuropathy symptoms.  No nausea/vomiting.  No mouth sores.  No diarrhea.  Persistent rash mainly on the face.  No fever.  No chills.  Objective:  Vital signs in last 24 hours:  Blood pressure 121/75, pulse 65, temperature 98.2 F (36.8 C), resp. rate 18, weight 186 lb 9.6 oz (84.6 kg), last menstrual period 09/17/2012, SpO2 100 %.    HEENT: No thrush or ulcers. Resp: Lungs clear bilaterally. Cardio: Regular rate and rhythm. GI: Abdomen soft and nontender.  No hepatosplenomegaly. Vascular: No leg edema. Skin: Stable hyperpigmented rash scattered over the face, less so upper chest and upper back. Port-A-Cath without erythema.  Lab Results:  Lab Results  Component Value Date   WBC 4.8 06/06/2022   HGB 9.2 (L) 06/06/2022   HCT 29.2 (L) 06/06/2022   MCV 93.3 06/06/2022   PLT 314 06/06/2022   NEUTROABS 3.3 06/06/2022    Imaging:  CT CHEST ABDOMEN PELVIS W CONTRAST  Result Date: 06/06/2022 CLINICAL DATA:  56 year old female presenting for pancreatic cancer follow-up. * Tracking Code: BO * EXAM: CT CHEST, ABDOMEN, AND PELVIS WITH CONTRAST TECHNIQUE: Multidetector CT imaging of the chest, abdomen and pelvis was performed following the standard protocol during bolus administration of intravenous contrast. RADIATION DOSE REDUCTION: This exam was performed according to the departmental dose-optimization program which includes automated exposure control, adjustment of the mA and/or kV according to patient size and/or use of iterative reconstruction technique. CONTRAST:  72m OMNIPAQUE IOHEXOL 300 MG/ML  SOLN COMPARISON:  Previous imaging from March 11, 2022. FINDINGS: CT CHEST FINDINGS Cardiovascular: Scattered aortic atherosclerotic changes. No aneurysmal  dilation of the thoracic aorta. Normal heart size without pericardial effusion or nodularity. Normal appearance of central pulmonary vasculature on venous phase. Mediastinum/Nodes: LEFT-sided Port-A-Cath enters via subclavian approach in terminates in the low SVC. No thoracic inlet, axillary, mediastinal or hilar adenopathy. Esophagus grossly normal. Lungs/Pleura: Multifocal cavitary pulmonary nodules. Dominant RIGHT upper lobe nodule (image 55/4) 1.8 cm, stable. Index RIGHT upper lobe pulmonary nodule (image 42/4) 9 mm, stable. LEFT lower lobe pulmonary nodule mainly cystic (image 53/4) 19 mm, previously 17 mm but remaining entirely cystic. LEFT lower lobe solid pulmonary nodule (image 61/4) 10 mm, stable. Dominant nodule in the RIGHT lower lobe (image 79/4) 13 mm, also stable compared to previous imaging. No definite new nodules, multiplicity of nodules some quite small makes detection of tiny new nodules difficult. Airways are patent. No consolidative changes or sign of pleural effusion. No new nodules greater than a cm. Musculoskeletal: See below for full musculoskeletal details. CT ABDOMEN PELVIS FINDINGS Hepatobiliary: RIGHT posterior hepatic lobe lesion, hepatic subsegment VI (image 50/2) 2.6 cm previously 4.0 cm greatest dimension. Signs of pneumobilia. Portal vein is patent. Subtle area of low attenuation in the lateral segment of the LEFT hepatic lobe and background heterogeneity of hepatic parenchyma with increased heterogeneity of hepatic parenchyma since previous imaging. LEFT hepatic lobe hypodensity on image 39/2 measuring 12 mm and a small area of hypodensity in the dome of the RIGHT hemiliver (image 36/2 measuring 11-12 mm. Vague hypodensity in the anterior inferior RIGHT hemiliver (image 54/2) 2.3 x 1.4 cm not definitely seen previously. Pancreas: Post Whipple procedure. No ductal dilation or sign of inflammation. Stent remaining in the pancreatic duct. Stable amorphous stranding and soft  tissue  about the celiac and portal vein. Spleen: Normal. Adrenals/Urinary Tract: 2.3 cm well-circumscribed RIGHT adrenal lesion is unchanged. LEFT adrenal gland is normal. Symmetric renal enhancement with signs of cortical scarring along the anterior surface of the RIGHT kidney similar to previous imaging. No suspicious renal lesion. Stomach/Bowel: Signs of prior Whipple procedure. No signs of bowel obstruction or acute bowel process. Normal appendix. Vascular/Lymphatic: No discrete adenopathy in the abdomen or in the pelvis. Reproductive: Fibroid uterus similar to prior imaging Other: No ascites no peritoneal nodularity Musculoskeletal: No acute bone finding or destructive bone process. Spinal degenerative changes. IMPRESSION: 1. Multifocal cavitary pulmonary nodules, stable compared to previous imaging. No definite new nodules, no new nodules greater than a cm. 2. Decreased size of the RIGHT posterior hepatic lobe lesion, hepatic subsegment VI. 3. Generalized heterogeneity and new focal areas of hypodensity in the liver. Findings could reflect sequela of low level cholangitis. Difficult to exclude new lesions developing in the liver. Correlate with any clinical evidence of cholangitis. Suggest correlation with any symptoms that would suggest cholangitis and consider short interval follow-up MRI with and without contrast utilizing Eovist contrast material. 4. Stable amorphous stranding and soft tissue about the operative site post Whipple. 5. Stable RIGHT adrenal lesion. 6. Signs of prior Whipple procedure. Electronically Signed   By: Zetta Bills M.D.   On: 06/06/2022 09:08    Medications: I have reviewed the patient's current medications.  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 Tumor invades the duodenal wall and vascular groove, resection margins negative, 4/34 lymph nodes positive MSI-stable, tumor showed instability in 2 loci as did adjacent  normal tissue Foundation 1-K-ras G12 V, microsatellite status and tumor mutation burden cannot be determined EUS FNA biopsy of pancreas mass on 07/03/2018-well-differentiated neuroendocrine tumor CTs 01/29/2019-ill-defined pancreas head mass, 5 pulmonary nodules-1 with a small amount of central cavitation, tumor abuts the left margin of the portal vein indistinct density surrounding, hepatic artery, complex cystic lesion of the right kidney, right adrenal mass-characterized as an adenoma on a [facility] MRI 12/21/2018 [facility] 03/03/2019-no focal pancreas activity, no tracer accumulation within the suspicious pulmonary nodules, left uterine mass with tracer accumulation felt to represent a leiomyoma Elevated preoperative CA 19-9--CA 19-9 186 on 01/18/2019 CT chest 04/16/2019-multiple bilateral pulmonary nodules, some with increased cavitation, stable in size Cycle 1 FOLFIRINOX 04/27/2019 Cycle 2 FOLFIRINOX 05/11/2019 Cycle 3 FOLFIRINOX 05/23/2019 Cycle 4 FOLFIRINOX 06/08/2019 Cycle 5 FOLFIRINOX 06/22/2019 CT chest 07/02/2019-stable size of bilateral pulmonary nodules.  Dominant cavitary lesions in both lungs show increased cavitation with thinner walls.  Stable 2.1 cm right adrenal nodule. 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 No definite evidence of metastatic disease in the abdomen or pelvis. Markedly enlarged and heterogeneous appearing uterus, likely to represent multifocal fibroids. 1 of these lesions appears to be an exophytic subserosal fibroid in the posterior lateral aspect of the uterine  body on the left side although this comes in close proximity to the left adnexa such that a primary ovarian lesion is difficult to completely exclude. CTs 02/07/2020-slight enlargement of bilateral lung nodules, some are cavitary, no evidence of metastatic disease in the abdomen or pelvis, stable right  adrenal nodule, uterine fibroids CTs 04/26/2020-mild enlargement of pulmonary nodules, slight increase in trace pelvic fluid, new soft tissue thickening inferior to the cecal tip suspicious for peritoneal metastasis CT 05/26/2020-improved appearance of soft tissue at the inferior tip of the cecum, mildly thickened short appendix-findings suggestive of resolving appendicitis, stable small bibasilar pulmonary nodules, fibroids Plan biopsy of right cecal tip soft tissue canceled secondary to radiologic improvement CT chest 08/02/2020-enlargement and progressive cavitation of multiple bilateral lung nodules.  Some new nodules are present. CTs 10/24/2020- increase in size of pulmonary nodules, no new nodules, no evidence of metastatic disease in the abdomen, stable right adrenal nodule CT 01/09/2021-slight interval enlargement of pulmonary nodules, stable right adrenal nodule Navigation bronchoscopy 01/30/2021-left lower lobe cavitary nodule FNA-adenocarcinoma, brushing-adenocarcinoma.  Left lower lobe lavage-adenocarcinoma.  Right upper lobe brushing and FNA biopsy of cavitary nodule-adenocarcinoma-immunohistochemical profile consistent with pancreas adenocarcinoma Cycle 1 gemcitabine/Abraxane 03/28/2021 Cycle 2 gemcitabine/Abraxane 04/11/2021 Cycle 3 gemcitabine/Abraxane 04/25/2021 Cycle 4 gemcitabine/Abraxane 05/09/2021 Cycle 5 gemcitabine/Abraxane 05/23/2021 CT chest 06/05/2021-interval cavitation of multiple pulmonary nodules, some nodules have decreased in size, no new or enlarging nodules Cycle 6 gemcitabine/Abraxane 06/06/2021 Cycle 7 gemcitabine/Abraxane 06/21/2021 Cycle 8 gemcitabine/Abraxane  07/05/2021 Cycle 9 Gemcitabine/Abraxane 07/19/2021 Cycle 10 gemcitabine 08/01/2021-Abraxane held secondary to neuropathy CT chest 08/13/2021-mild decrease in size and wall thickness of multiple cavitary nodules, no new or progressive disease in the chest, indeterminate low-attenuation right liver lesions Cycle 11 gemcitabine 08/16/2021-Abraxane held secondary to neuropathy Cycle 12 gemcitabine 08/30/2021-Abraxane held secondary to neuropathy Cycle 13 gemcitabine 09/13/2021-Abraxane held secondary to neuropathy Cycle 14 gemcitabine 09/27/2021-Abraxane held secondary to neuropathy Cycle 15 gemcitabine 10/11/2021-Abraxane held secondary to neuropathy CTs 10/22/2021-no change in multiple cavitary lung nodules, no evidence of disease progression, ill-defined hypodense lesion in the posterior right liver suspicious for metastatic disease Cycle 16 gemcitabine 10/25/2021 Cycle 17 gemcitabine 11/08/2021 Cycle 18 Gemcitabine 11/22/2021 Cycle 19 gemcitabine 12/06/2021 Cycle 20 Gemcitabine 12/20/2021 CT 12/31/2021-mild increase in size of bilateral pulmonary metastases, stable subtle continuation right liver lesions Cycle 20 gemcitabine/Abraxane 01/03/2022 Cycle 21 gemcitabine/Abraxane 01/17/2022 Cycle 22 gemcitabine/Abraxane 01/31/2022 Cycle 23 gemcitabine/Abraxane 02/14/2022 Cycle 24 gemcitabine/Abraxane 02/28/2022 CTs 03/11/2022-widespread metastatic disease to the lungs again noted with slight involution of several of the pulmonary nodules, no definite new nodules noted; interval cavitation of solid lesion in the posterior aspect right lobe of the liver, no new liver lesions noted. Cycle 25 Gemcitabine/Abraxane 03/14/2022 Cycle 26 gemcitabine/Abraxane 03/28/2022 Cycle 27 gemcitabine/Abraxane 04/25/2022 Cycle 28 gemcitabine/Abraxane 05/09/2022 Cycle 29 Gemcitabine 05/23/2022, Abraxane held due to neuropathy CTs 06/04/2022-stable multifocal cavitary pulmonary nodules.  No definite new nodules.  No new nodules greater than a  centimeter.  Decreased size of the right posterior hepatic lobe lesion.  Generalized heterogeneity and new focal areas of hypodensity in the liver. 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 Stauber appears stable.  She is on active treatment with gemcitabine/Abraxane.  Abraxane was held 2 weeks ago due to progressive neuropathy symptoms.  Recent restaging CT scans show stable disease.  Dr. Gearldine Shown recommendation is to continue the current treatment.  Plan  to proceed with gemcitabine alone today.  Continue to follow the CA 19-9 tumor marker.  Radiologist questioned low-level cholangitis on the CT.  She has no symptoms to suggest cholangitis.  Continue to monitor.  CBC and chemistry panel reviewed.  Labs adequate to proceed with treatment.  She will return for follow-up in 2 weeks.  We are available to see her sooner if needed.  Patient seen with Dr. Benay Spice.  CT report/images reviewed with Ms. Randle.    Ned Card ANP/GNP-BC   06/06/2022  11:08 AM This was a shared visit with Ned Card.  Ms. Stoermer was interviewed.  We reviewed the CT findings and images with Ms. Mcinnis.  The overall pattern is consistent with stable disease.  The new areas of hypodensity in the liver are nonspecific.There is no clinical evidence of cholangitis. I recommend continuing gemcitabine.  Julieanne Manson, MD

## 2022-06-06 NOTE — Patient Instructions (Signed)

## 2022-06-07 DIAGNOSIS — Z794 Long term (current) use of insulin: Secondary | ICD-10-CM | POA: Diagnosis not present

## 2022-06-07 DIAGNOSIS — E119 Type 2 diabetes mellitus without complications: Secondary | ICD-10-CM | POA: Diagnosis not present

## 2022-06-07 LAB — CANCER ANTIGEN 19-9: CA 19-9: 50 U/mL — ABNORMAL HIGH (ref 0–35)

## 2022-06-15 ENCOUNTER — Other Ambulatory Visit: Payer: Self-pay | Admitting: Oncology

## 2022-06-15 DIAGNOSIS — C250 Malignant neoplasm of head of pancreas: Secondary | ICD-10-CM

## 2022-06-17 DIAGNOSIS — L710 Perioral dermatitis: Secondary | ICD-10-CM | POA: Diagnosis not present

## 2022-06-20 ENCOUNTER — Inpatient Hospital Stay: Payer: BC Managed Care – PPO

## 2022-06-20 ENCOUNTER — Inpatient Hospital Stay: Payer: BC Managed Care – PPO | Attending: Genetic Counselor

## 2022-06-20 ENCOUNTER — Encounter: Payer: Self-pay | Admitting: *Deleted

## 2022-06-20 ENCOUNTER — Inpatient Hospital Stay: Payer: BC Managed Care – PPO | Admitting: Oncology

## 2022-06-20 VITALS — BP 103/65 | HR 57 | Resp 20

## 2022-06-20 VITALS — BP 124/83 | HR 65 | Temp 98.1°F | Resp 18 | Ht 67.0 in | Wt 189.4 lb

## 2022-06-20 DIAGNOSIS — C250 Malignant neoplasm of head of pancreas: Secondary | ICD-10-CM

## 2022-06-20 DIAGNOSIS — Z79899 Other long term (current) drug therapy: Secondary | ICD-10-CM | POA: Diagnosis not present

## 2022-06-20 DIAGNOSIS — Z5111 Encounter for antineoplastic chemotherapy: Secondary | ICD-10-CM | POA: Insufficient documentation

## 2022-06-20 LAB — CBC WITH DIFFERENTIAL (CANCER CENTER ONLY)
Abs Immature Granulocytes: 0.01 10*3/uL (ref 0.00–0.07)
Basophils Absolute: 0 10*3/uL (ref 0.0–0.1)
Basophils Relative: 1 %
Eosinophils Absolute: 0.1 10*3/uL (ref 0.0–0.5)
Eosinophils Relative: 2 %
HCT: 29.8 % — ABNORMAL LOW (ref 36.0–46.0)
Hemoglobin: 9.3 g/dL — ABNORMAL LOW (ref 12.0–15.0)
Immature Granulocytes: 0 %
Lymphocytes Relative: 27 %
Lymphs Abs: 1 10*3/uL (ref 0.7–4.0)
MCH: 29.5 pg (ref 26.0–34.0)
MCHC: 31.2 g/dL (ref 30.0–36.0)
MCV: 94.6 fL (ref 80.0–100.0)
Monocytes Absolute: 0.6 10*3/uL (ref 0.1–1.0)
Monocytes Relative: 16 %
Neutro Abs: 2.1 10*3/uL (ref 1.7–7.7)
Neutrophils Relative %: 54 %
Platelet Count: 251 10*3/uL (ref 150–400)
RBC: 3.15 MIL/uL — ABNORMAL LOW (ref 3.87–5.11)
RDW: 15.9 % — ABNORMAL HIGH (ref 11.5–15.5)
WBC Count: 3.8 10*3/uL — ABNORMAL LOW (ref 4.0–10.5)
nRBC: 0 % (ref 0.0–0.2)

## 2022-06-20 LAB — CMP (CANCER CENTER ONLY)
ALT: 38 U/L (ref 0–44)
AST: 47 U/L — ABNORMAL HIGH (ref 15–41)
Albumin: 3.7 g/dL (ref 3.5–5.0)
Alkaline Phosphatase: 159 U/L — ABNORMAL HIGH (ref 38–126)
Anion gap: 6 (ref 5–15)
BUN: 10 mg/dL (ref 6–20)
CO2: 23 mmol/L (ref 22–32)
Calcium: 8.8 mg/dL — ABNORMAL LOW (ref 8.9–10.3)
Chloride: 109 mmol/L (ref 98–111)
Creatinine: 1.13 mg/dL — ABNORMAL HIGH (ref 0.44–1.00)
GFR, Estimated: 57 mL/min — ABNORMAL LOW (ref >60)
Glucose, Bld: 203 mg/dL — ABNORMAL HIGH (ref 70–99)
Potassium: 4 mmol/L (ref 3.5–5.1)
Sodium: 138 mmol/L (ref 135–145)
Total Bilirubin: 0.5 mg/dL (ref 0.3–1.2)
Total Protein: 7.1 g/dL (ref 6.5–8.1)

## 2022-06-20 LAB — MAGNESIUM: Magnesium: 1.8 mg/dL (ref 1.7–2.4)

## 2022-06-20 MED ORDER — SODIUM CHLORIDE 0.9 % IV SOLN
2000.0000 mg | Freq: Once | INTRAVENOUS | Status: AC
  Administered 2022-06-20: 2000 mg via INTRAVENOUS
  Filled 2022-06-20: qty 52.6

## 2022-06-20 MED ORDER — PROCHLORPERAZINE MALEATE 10 MG PO TABS
10.0000 mg | ORAL_TABLET | Freq: Once | ORAL | Status: AC
  Administered 2022-06-20: 10 mg via ORAL
  Filled 2022-06-20: qty 1

## 2022-06-20 MED ORDER — HEPARIN SOD (PORK) LOCK FLUSH 100 UNIT/ML IV SOLN
500.0000 [IU] | Freq: Once | INTRAVENOUS | Status: AC | PRN
  Administered 2022-06-20: 500 [IU]

## 2022-06-20 MED ORDER — SODIUM CHLORIDE 0.9 % IV SOLN: Freq: Once | INTRAVENOUS | Status: AC

## 2022-06-20 MED ORDER — SODIUM CHLORIDE 0.9% FLUSH
10.0000 mL | INTRAVENOUS | Status: DC | PRN
  Administered 2022-06-20: 10 mL

## 2022-06-20 NOTE — Patient Instructions (Signed)
Alyssa Ross   Discharge Instructions: Thank you for choosing [facility] to provide your oncology and hematology care.   If you have a lab appointment with the [facility], please go directly to the [facility] and check in at the registration area.   Wear comfortable clothing and clothing appropriate for easy access to any Portacath or PICC line.   We strive to give you quality time with your provider. You may need to reschedule your appointment if you arrive late (15 or more minutes).  Arriving late affects you and other patients whose appointments are after yours.  Also, if you miss three or more appointments without notifying the office, you may be dismissed from the clinic at the provider's discretion.      For prescription refill requests, have your pharmacy contact our office and allow 72 hours for refills to be completed.    Today you received the following chemotherapy and/or immunotherapy agents Gemzar.      To help prevent nausea and vomiting after your treatment, we encourage you to take your nausea medication as directed.  BELOW ARE SYMPTOMS THAT SHOULD BE REPORTED IMMEDIATELY: *FEVER GREATER THAN 100.4 F (38 C) OR HIGHER *CHILLS OR SWEATING *NAUSEA AND VOMITING THAT IS NOT CONTROLLED WITH YOUR NAUSEA MEDICATION *UNUSUAL SHORTNESS OF BREATH *UNUSUAL BRUISING OR BLEEDING *URINARY PROBLEMS (pain or burning when urinating, or frequent urination) *BOWEL PROBLEMS (unusual diarrhea, constipation, pain near the anus) TENDERNESS IN MOUTH AND THROAT WITH OR WITHOUT PRESENCE OF ULCERS (sore throat, sores in mouth, or a toothache) UNUSUAL RASH, SWELLING OR PAIN  UNUSUAL VAGINAL DISCHARGE OR ITCHING   Items with * indicate a potential emergency and should be followed up as soon as possible or go to the Emergency Department if any problems should occur.  Please show the CHEMOTHERAPY ALERT CARD or IMMUNOTHERAPY ALERT CARD at check-in to the  Emergency Department and triage nurse.  Should you have questions after your visit or need to cancel or reschedule your appointment, please contact [facility]  Dept: [phone]  and follow the prompts.  Office hours are 8:00 a.m. to 4:30 p.m. Monday - Friday. Please note that voicemails left after 4:00 p.m. may not be returned until the following business day.  We are closed weekends and major holidays. You have access to a nurse at all times for urgent questions. Please call the main number to the clinic Dept: [phone] and follow the prompts.   For any non-urgent questions, you may also contact your provider using MyChart. We now offer e-Visits for anyone 71 and older to request care online for non-urgent symptoms. For details visit [email or web address].   Also download the MyChart app! Go to the app store, search "MyChart", open the app, select [facility], and log in with your MyChart username and password.  Gemcitabine Injection What is this medication? GEMCITABINE (jem SYE ta been) treats some types of cancer. It works by slowing down the growth of cancer cells. This medicine may be used for other purposes; ask your health care provider or pharmacist if you have questions. COMMON BRAND NAME(S): Gemzar, Infugem What should I tell my care team before I take this medication? They need to know if you have any of these conditions: Blood disorders Infection Kidney disease Liver disease Lung or breathing disease, such as asthma or COPD Recent or ongoing radiation therapy An unusual or allergic reaction to gemcitabine, other medications, foods, dyes, or preservatives If you  or your partner are pregnant or trying to get pregnant Breast-feeding How should I use this medication? This medication is injected into a vein. It is given by your care team in a hospital or clinic setting. Talk to your care team about the use of this medication in children. Special  care may be needed. Overdosage: If you think you have taken too much of this medicine contact a poison control center or emergency room at once. NOTE: This medicine is only for you. Do not share this medicine with others. What if I miss a dose? Keep appointments for follow-up doses. It is important not to miss your dose. Call your care team if you are unable to keep an appointment. What may interact with this medication? Interactions have not been studied. This list may not describe all possible interactions. Give your health care provider a list of all the medicines, herbs, non-prescription drugs, or dietary supplements you use. Also tell them if you smoke, drink alcohol, or use illegal drugs. Some items may interact with your medicine. What should I watch for while using this medication? Your condition will be monitored carefully while you are receiving this medication. This medication may make you feel generally unwell. This is not uncommon, as chemotherapy can affect healthy cells as well as cancer cells. Report any side effects. Continue your course of treatment even though you feel ill unless your care team tells you to stop. In some cases, you may be given additional medications to help with side effects. Follow all directions for their use. This medication may increase your risk of getting an infection. Call your care team for advice if you get a fever, chills, sore throat, or other symptoms of a cold or flu. Do not treat yourself. Try to avoid being around people who are sick. This medication may increase your risk to bruise or bleed. Call your care team if you notice any unusual bleeding. Be careful brushing or flossing your teeth or using a toothpick because you may get an infection or bleed more easily. If you have any dental work done, tell your dentist you are receiving this medication. Avoid taking medications that contain aspirin, acetaminophen, ibuprofen, naproxen, or ketoprofen  unless instructed by your care team. These medications may hide a fever. Talk to your care team if you or your partner wish to become pregnant or think you might be pregnant. This medication can cause serious birth defects if taken during pregnancy and for 6 months after the last dose. A negative pregnancy test is required before starting this medication. A reliable form of contraception is recommended while taking this medication and for 6 months after the last dose. Talk to your care team about effective forms of contraception. Do not father a child while taking this medication and for 3 months after the last dose. Use a condom while having sex during this time period. Do not breastfeed while taking this medication and for at least 1 week after the last dose. This medication may cause infertility. Talk to your care team if you are concerned about your fertility. What side effects may I notice from receiving this medication? Side effects that you should report to your care team as soon as possible: Allergic reactions--skin rash, itching, hives, swelling of the face, lips, tongue, or throat Capillary leak syndrome--stomach or muscle pain, unusual weakness or fatigue, feeling faint or lightheaded, decrease in the amount of urine, swelling of the ankles, hands, or feet, trouble breathing Infection--fever, chills, cough, sore  throat, wounds that don't heal, pain or trouble when passing urine, general feeling of discomfort or being unwell Liver injury--right upper belly pain, loss of appetite, nausea, light-colored stool, dark yellow or brown urine, yellowing skin or eyes, unusual weakness or fatigue Low red blood cell level--unusual weakness or fatigue, dizziness, headache, trouble breathing Lung injury--shortness of breath or trouble breathing, cough, spitting up blood, chest pain, fever Stomach pain, bloody diarrhea, pale skin, unusual weakness or fatigue, decrease in the amount of urine, which may be  signs of hemolytic uremic syndrome Sudden and severe headache, confusion, change in vision, seizures, which may be signs of posterior reversible encephalopathy syndrome (PRES) Unusual bruising or bleeding Side effects that usually do not require medical attention (report to your care team if they continue or are bothersome): Diarrhea Drowsiness Hair loss Nausea Pain, redness, or swelling with sores inside the mouth or throat Vomiting This list may not describe all possible side effects. Call your doctor for medical advice about side effects. You may report side effects to FDA at 1-800-FDA-1088. Where should I keep my medication? This medication is given in a hospital or clinic. It will not be stored at home. NOTE: This sheet is a summary. It may not cover all possible information. If you have questions about this medicine, talk to your doctor, pharmacist, or health care provider.  2023 Elsevier/Gold Standard (2007-07-18 00:00:00)

## 2022-06-20 NOTE — Progress Notes (Signed)
[facility] OFFICE PROGRESS NOTE   Diagnosis: Pancreas cancer  INTERVAL HISTORY:   Ms. Basista completed another cycle of gemcitabine on 06/06/2022.  No new complaint.  Neuropathy symptoms persist.  She reports her blood sugars under better control.  She is working.  Objective:  Vital signs in last 24 hours:  Blood pressure 124/83, pulse 65, temperature 98.1 F (36.7 C), temperature source Oral, resp. rate 18, height 5\' 7"  (1.702 m), weight 189 lb 6.4 oz (85.9 kg), last menstrual period 09/17/2012, SpO2 100 %.    HEENT: No thrush or ulcers  Resp: Lungs clear bilaterally Cardio: Regular rate and rhythm GI: No hepatosplenomegaly, nontender Vascular: No leg edema  Skin: Hyperpigmented rash over the face  Portacath/PICC-without erythema  Lab Results:  Lab Results  Component Value Date   WBC 3.8 (L) 06/20/2022   HGB 9.3 (L) 06/20/2022   HCT 29.8 (L) 06/20/2022   MCV 94.6 06/20/2022   PLT 251 06/20/2022   NEUTROABS 2.1 06/20/2022    CMP  Lab Results  Component Value Date   NA 138 06/20/2022   K 4.0 06/20/2022   CL 109 06/20/2022   CO2 23 06/20/2022   GLUCOSE 203 (H) 06/20/2022   BUN 10 06/20/2022   CREATININE 1.13 (H) 06/20/2022   CALCIUM 8.8 (L) 06/20/2022   PROT 7.1 06/20/2022   ALBUMIN 3.7 06/20/2022   AST 47 (H) 06/20/2022   ALT 38 06/20/2022   ALKPHOS 159 (H) 06/20/2022   BILITOT 0.5 06/20/2022   GFRNONAA 57 (L) 06/20/2022   GFRAA >60 02/07/2020    Lab Results  Component Value Date   KYH062 50 (H) 06/06/2022    Medications: I have reviewed the patient's current medications.   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 Tumor invades the duodenal wall and vascular groove, resection margins negative, 4/34 lymph nodes positive MSI-stable, tumor showed instability in 2 loci as did adjacent normal tissue Foundation 1-K-ras G12 V, microsatellite status and tumor mutation burden  cannot be determined EUS FNA biopsy of pancreas mass on 07/03/2018-well-differentiated neuroendocrine tumor CTs 01/29/2019-ill-defined pancreas head mass, 5 pulmonary nodules-1 with a small amount of central cavitation, tumor abuts the left margin of the portal vein indistinct density surrounding, hepatic artery, complex cystic lesion of the right kidney, right adrenal mass-characterized as an adenoma on a [facility] MRI 12/21/2018 [facility] 03/03/2019-no focal pancreas activity, no tracer accumulation within the suspicious pulmonary nodules, left uterine mass with tracer accumulation felt to represent a leiomyoma Elevated preoperative CA 19-9--CA 19-9 186 on 01/18/2019 CT chest 04/16/2019-multiple bilateral pulmonary nodules, some with increased cavitation, stable in size Cycle 1 FOLFIRINOX 04/27/2019 Cycle 2 FOLFIRINOX 05/11/2019 Cycle 3 FOLFIRINOX 05/23/2019 Cycle 4 FOLFIRINOX 06/08/2019 Cycle 5 FOLFIRINOX 06/22/2019 CT chest 07/02/2019-stable size of bilateral pulmonary nodules.  Dominant cavitary lesions in both lungs show increased cavitation with thinner walls.  Stable 2.1 cm right adrenal nodule. 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 No definite evidence of metastatic disease in the abdomen or pelvis. Markedly enlarged and heterogeneous appearing uterus, likely to represent multifocal fibroids. 1 of these lesions appears to be an exophytic subserosal fibroid in the posterior lateral aspect of the  uterine body on the left side although this comes in close proximity to the left adnexa such  that a primary ovarian lesion is difficult to completely exclude. CTs 02/07/2020-slight enlargement of bilateral lung nodules, some are cavitary, no evidence of metastatic disease in the abdomen or pelvis, stable right adrenal nodule, uterine fibroids CTs 04/26/2020-mild enlargement of pulmonary nodules, slight increase in trace pelvic fluid, new soft tissue thickening inferior to the cecal tip suspicious for peritoneal metastasis CT 05/26/2020-improved appearance of soft tissue at the inferior tip of the cecum, mildly thickened short appendix-findings suggestive of resolving appendicitis, stable small bibasilar pulmonary nodules, fibroids Plan biopsy of right cecal tip soft tissue canceled secondary to radiologic improvement CT chest 08/02/2020-enlargement and progressive cavitation of multiple bilateral lung nodules.  Some new nodules are present. CTs 10/24/2020- increase in size of pulmonary nodules, no new nodules, no evidence of metastatic disease in the abdomen, stable right adrenal nodule CT 01/09/2021-slight interval enlargement of pulmonary nodules, stable right adrenal nodule Navigation bronchoscopy 01/30/2021-left lower lobe cavitary nodule FNA-adenocarcinoma, brushing-adenocarcinoma.  Left lower lobe lavage-adenocarcinoma.  Right upper lobe brushing and FNA biopsy of cavitary nodule-adenocarcinoma-immunohistochemical profile consistent with pancreas adenocarcinoma Cycle 1 gemcitabine/Abraxane 03/28/2021 Cycle 2 gemcitabine/Abraxane 04/11/2021 Cycle 3 gemcitabine/Abraxane 04/25/2021 Cycle 4 gemcitabine/Abraxane 05/09/2021 Cycle 5 gemcitabine/Abraxane 05/23/2021 CT chest 06/05/2021-interval cavitation of multiple pulmonary nodules, some nodules have decreased in size, no new or enlarging nodules Cycle 6 gemcitabine/Abraxane 06/06/2021 Cycle 7 gemcitabine/Abraxane 06/21/2021 Cycle 8 gemcitabine/Abraxane 07/05/2021 Cycle 9 Gemcitabine/Abraxane 07/19/2021 Cycle 10 gemcitabine 08/01/2021-Abraxane held  secondary to neuropathy CT chest 08/13/2021-mild decrease in size and wall thickness of multiple cavitary nodules, no new or progressive disease in the chest, indeterminate low-attenuation right liver lesions Cycle 11 gemcitabine 08/16/2021-Abraxane held secondary to neuropathy Cycle 12 gemcitabine 08/30/2021-Abraxane held secondary to neuropathy Cycle 13 gemcitabine 09/13/2021-Abraxane held secondary to neuropathy Cycle 14 gemcitabine 09/27/2021-Abraxane held secondary to neuropathy Cycle 15 gemcitabine 10/11/2021-Abraxane held secondary to neuropathy CTs 10/22/2021-no change in multiple cavitary lung nodules, no evidence of disease progression, ill-defined hypodense lesion in the posterior right liver suspicious for metastatic disease Cycle 16 gemcitabine 10/25/2021 Cycle 17 gemcitabine 11/08/2021 Cycle 18 Gemcitabine 11/22/2021 Cycle 19 gemcitabine 12/06/2021 Cycle 20 Gemcitabine 12/20/2021 CT 12/31/2021-mild increase in size of bilateral pulmonary metastases, stable subtle continuation right liver lesions Cycle 20 gemcitabine/Abraxane 01/03/2022 Cycle 21 gemcitabine/Abraxane 01/17/2022 Cycle 22 gemcitabine/Abraxane 01/31/2022 Cycle 23 gemcitabine/Abraxane 02/14/2022 Cycle 24 gemcitabine/Abraxane 02/28/2022 CTs 03/11/2022-widespread metastatic disease to the lungs again noted with slight involution of several of the pulmonary nodules, no definite new nodules noted; interval cavitation of solid lesion in the posterior aspect right lobe of the liver, no new liver lesions noted. Cycle 25 Gemcitabine/Abraxane 03/14/2022 Cycle 26 gemcitabine/Abraxane 03/28/2022 Cycle 27 gemcitabine/Abraxane 04/25/2022 Cycle 28 gemcitabine/Abraxane 05/09/2022 Cycle 29 Gemcitabine 05/23/2022, Abraxane held due to neuropathy CTs 06/04/2022-stable multifocal cavitary pulmonary nodules.  No definite new nodules.  No new nodules greater than a centimeter.  Decreased size of the right posterior hepatic lobe lesion.  Generalized heterogeneity  and new focal areas of hypodensity in the liver. 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  She is tolerating the gemcitabine well.  Abraxane will remain on hold due to neuropathy.  She will complete another cycle of gemcitabine today.  She will return for an office visit and chemotherapy in 2 weeks.  We will plan for a restaging CT in March. We will follow-up on the CA 19-9 from today.  [name], MD  06/20/2022  10:36 AM

## 2022-06-20 NOTE — Progress Notes (Signed)
Patient seen by Dr. Sherrill today ? ?Vitals are within treatment parameters. ? ?Labs reviewed by Dr. Sherrill and are within treatment parameters. ? ?Per physician team, patient is ready for treatment and there are NO modifications to the treatment plan.  ?

## 2022-06-21 LAB — CANCER ANTIGEN 19-9: CA 19-9: 53 U/mL — ABNORMAL HIGH (ref 0–35)

## 2022-06-26 DIAGNOSIS — Z713 Dietary counseling and surveillance: Secondary | ICD-10-CM | POA: Diagnosis not present

## 2022-06-29 ENCOUNTER — Other Ambulatory Visit: Payer: Self-pay | Admitting: Oncology

## 2022-07-04 ENCOUNTER — Inpatient Hospital Stay: Payer: BC Managed Care – PPO

## 2022-07-04 ENCOUNTER — Inpatient Hospital Stay: Payer: BC Managed Care – PPO | Admitting: Oncology

## 2022-07-04 ENCOUNTER — Other Ambulatory Visit: Payer: Self-pay

## 2022-07-04 VITALS — BP 107/71 | HR 55 | Resp 18

## 2022-07-04 VITALS — BP 118/78 | HR 67 | Temp 98.1°F | Resp 18 | Ht 67.0 in | Wt 195.0 lb

## 2022-07-04 DIAGNOSIS — C250 Malignant neoplasm of head of pancreas: Secondary | ICD-10-CM

## 2022-07-04 DIAGNOSIS — Z5111 Encounter for antineoplastic chemotherapy: Secondary | ICD-10-CM | POA: Diagnosis not present

## 2022-07-04 DIAGNOSIS — Z79899 Other long term (current) drug therapy: Secondary | ICD-10-CM | POA: Diagnosis not present

## 2022-07-04 LAB — CBC WITH DIFFERENTIAL (CANCER CENTER ONLY)
Abs Immature Granulocytes: 0 10*3/uL (ref 0.00–0.07)
Basophils Absolute: 0 10*3/uL (ref 0.0–0.1)
Basophils Relative: 1 %
Eosinophils Absolute: 0.1 10*3/uL (ref 0.0–0.5)
Eosinophils Relative: 3 %
HCT: 29.1 % — ABNORMAL LOW (ref 36.0–46.0)
Hemoglobin: 9.1 g/dL — ABNORMAL LOW (ref 12.0–15.0)
Immature Granulocytes: 0 %
Lymphocytes Relative: 28 %
Lymphs Abs: 1 10*3/uL (ref 0.7–4.0)
MCH: 29.7 pg (ref 26.0–34.0)
MCHC: 31.3 g/dL (ref 30.0–36.0)
MCV: 95.1 fL (ref 80.0–100.0)
Monocytes Absolute: 0.4 10*3/uL (ref 0.1–1.0)
Monocytes Relative: 13 %
Neutro Abs: 2 10*3/uL (ref 1.7–7.7)
Neutrophils Relative %: 55 %
Platelet Count: 292 10*3/uL (ref 150–400)
RBC: 3.06 MIL/uL — ABNORMAL LOW (ref 3.87–5.11)
RDW: 16.1 % — ABNORMAL HIGH (ref 11.5–15.5)
WBC Count: 3.5 10*3/uL — ABNORMAL LOW (ref 4.0–10.5)
nRBC: 0 % (ref 0.0–0.2)

## 2022-07-04 LAB — CMP (CANCER CENTER ONLY)
ALT: 26 U/L (ref 0–44)
AST: 24 U/L (ref 15–41)
Albumin: 3.6 g/dL (ref 3.5–5.0)
Alkaline Phosphatase: 145 U/L — ABNORMAL HIGH (ref 38–126)
Anion gap: 6 (ref 5–15)
BUN: 11 mg/dL (ref 6–20)
CO2: 24 mmol/L (ref 22–32)
Calcium: 8.9 mg/dL (ref 8.9–10.3)
Chloride: 110 mmol/L (ref 98–111)
Creatinine: 1.2 mg/dL — ABNORMAL HIGH (ref 0.44–1.00)
GFR, Estimated: 53 mL/min — ABNORMAL LOW (ref >60)
Glucose, Bld: 116 mg/dL — ABNORMAL HIGH (ref 70–99)
Potassium: 3.9 mmol/L (ref 3.5–5.1)
Sodium: 140 mmol/L (ref 135–145)
Total Bilirubin: 0.4 mg/dL (ref 0.3–1.2)
Total Protein: 6.9 g/dL (ref 6.5–8.1)

## 2022-07-04 LAB — MAGNESIUM: Magnesium: 1.9 mg/dL (ref 1.7–2.4)

## 2022-07-04 MED ORDER — SODIUM CHLORIDE 0.9 % IV SOLN: Freq: Once | INTRAVENOUS | Status: AC

## 2022-07-04 MED ORDER — HEPARIN SOD (PORK) LOCK FLUSH 100 UNIT/ML IV SOLN
500.0000 [IU] | Freq: Once | INTRAVENOUS | Status: AC | PRN
  Administered 2022-07-04: 500 [IU]

## 2022-07-04 MED ORDER — PROCHLORPERAZINE MALEATE 10 MG PO TABS
10.0000 mg | ORAL_TABLET | Freq: Once | ORAL | Status: AC
  Administered 2022-07-04: 10 mg via ORAL
  Filled 2022-07-04: qty 1

## 2022-07-04 MED ORDER — SODIUM CHLORIDE 0.9% FLUSH
10.0000 mL | INTRAVENOUS | Status: DC | PRN
  Administered 2022-07-04: 10 mL

## 2022-07-04 MED ORDER — SODIUM CHLORIDE 0.9 % IV SOLN
2000.0000 mg | Freq: Once | INTRAVENOUS | Status: AC
  Administered 2022-07-04: 2000 mg via INTRAVENOUS
  Filled 2022-07-04: qty 52.6

## 2022-07-04 NOTE — Patient Instructions (Signed)
[facility]   Discharge Instructions: Thank you for choosing [facility] to provide your oncology and hematology care.   If you have a lab appointment with the [facility], please go directly to the [facility] and check in at the registration area.   Wear comfortable clothing and clothing appropriate for easy access to any Portacath or PICC line.   We strive to give you quality time with your provider. You may need to reschedule your appointment if you arrive late (15 or more minutes).  Arriving late affects you and other patients whose appointments are after yours.  Also, if you miss three or more appointments without notifying the office, you may be dismissed from the clinic at the provider's discretion.      For prescription refill requests, have your pharmacy contact our office and allow 72 hours for refills to be completed.    Today you received the following chemotherapy and/or immunotherapy agents Gemcitabine (GEMZAR).      To help prevent nausea and vomiting after your treatment, we encourage you to take your nausea medication as directed.  BELOW ARE SYMPTOMS THAT SHOULD BE REPORTED IMMEDIATELY: *FEVER GREATER THAN 100.4 F (38 C) OR HIGHER *CHILLS OR SWEATING *NAUSEA AND VOMITING THAT IS NOT CONTROLLED WITH YOUR NAUSEA MEDICATION *UNUSUAL SHORTNESS OF BREATH *UNUSUAL BRUISING OR BLEEDING *URINARY PROBLEMS (pain or burning when urinating, or frequent urination) *BOWEL PROBLEMS (unusual diarrhea, constipation, pain near the anus) TENDERNESS IN MOUTH AND THROAT WITH OR WITHOUT PRESENCE OF ULCERS (sore throat, sores in mouth, or a toothache) UNUSUAL RASH, SWELLING OR PAIN  UNUSUAL VAGINAL DISCHARGE OR ITCHING   Items with * indicate a potential emergency and should be followed up as soon as possible or go to the Emergency Department if any problems should occur.  Please show the CHEMOTHERAPY ALERT CARD or IMMUNOTHERAPY ALERT  CARD at check-in to the Emergency Department and triage nurse.  Should you have questions after your visit or need to cancel or reschedule your appointment, please contact [facility]  Dept: [phone]  and follow the prompts.  Office hours are 8:00 a.m. to 4:30 p.m. Monday - Friday. Please note that voicemails left after 4:00 p.m. may not be returned until the following business day.  We are closed weekends and major holidays. You have access to a nurse at all times for urgent questions. Please call the main number to the clinic Dept: [phone] and follow the prompts.   For any non-urgent questions, you may also contact your provider using MyChart. We now offer e-Visits for anyone 62 and older to request care online for non-urgent symptoms. For details visit [email or web address].   Also download the MyChart app! Go to the app store, search "MyChart", open the app, select [facility], and log in with your MyChart username and password.  Gemcitabine Injection What is this medication? GEMCITABINE (jem SYE ta been) treats some types of cancer. It works by slowing down the growth of cancer cells. This medicine may be used for other purposes; ask your health care provider or pharmacist if you have questions. COMMON BRAND NAME(S): Gemzar, Infugem What should I tell my care team before I take this medication? They need to know if you have any of these conditions: Blood disorders Infection Kidney disease Liver disease Lung or breathing disease, such as asthma or COPD Recent or ongoing radiation therapy An unusual or allergic reaction to gemcitabine, other medications, foods, dyes, or  preservatives If you or your partner are pregnant or trying to get pregnant Breast-feeding How should I use this medication? This medication is injected into a vein. It is given by your care team in a hospital or clinic setting. Talk to your care team about the use of this  medication in children. Special care may be needed. Overdosage: If you think you have taken too much of this medicine contact a poison control center or emergency room at once. NOTE: This medicine is only for you. Do not share this medicine with others. What if I miss a dose? Keep appointments for follow-up doses. It is important not to miss your dose. Call your care team if you are unable to keep an appointment. What may interact with this medication? Interactions have not been studied. This list may not describe all possible interactions. Give your health care provider a list of all the medicines, herbs, non-prescription drugs, or dietary supplements you use. Also tell them if you smoke, drink alcohol, or use illegal drugs. Some items may interact with your medicine. What should I watch for while using this medication? Your condition will be monitored carefully while you are receiving this medication. This medication may make you feel generally unwell. This is not uncommon, as chemotherapy can affect healthy cells as well as cancer cells. Report any side effects. Continue your course of treatment even though you feel ill unless your care team tells you to stop. In some cases, you may be given additional medications to help with side effects. Follow all directions for their use. This medication may increase your risk of getting an infection. Call your care team for advice if you get a fever, chills, sore throat, or other symptoms of a cold or flu. Do not treat yourself. Try to avoid being around people who are sick. This medication may increase your risk to bruise or bleed. Call your care team if you notice any unusual bleeding. Be careful brushing or flossing your teeth or using a toothpick because you may get an infection or bleed more easily. If you have any dental work done, tell your dentist you are receiving this medication. Avoid taking medications that contain aspirin, acetaminophen,  ibuprofen, naproxen, or ketoprofen unless instructed by your care team. These medications may hide a fever. Talk to your care team if you or your partner wish to become pregnant or think you might be pregnant. This medication can cause serious birth defects if taken during pregnancy and for 6 months after the last dose. A negative pregnancy test is required before starting this medication. A reliable form of contraception is recommended while taking this medication and for 6 months after the last dose. Talk to your care team about effective forms of contraception. Do not father a child while taking this medication and for 3 months after the last dose. Use a condom while having sex during this time period. Do not breastfeed while taking this medication and for at least 1 week after the last dose. This medication may cause infertility. Talk to your care team if you are concerned about your fertility. What side effects may I notice from receiving this medication? Side effects that you should report to your care team as soon as possible: Allergic reactions--skin rash, itching, hives, swelling of the face, lips, tongue, or throat Capillary leak syndrome--stomach or muscle pain, unusual weakness or fatigue, feeling faint or lightheaded, decrease in the amount of urine, swelling of the ankles, hands, or feet, trouble breathing Infection--fever,  chills, cough, sore throat, wounds that don't heal, pain or trouble when passing urine, general feeling of discomfort or being unwell Liver injury--right upper belly pain, loss of appetite, nausea, light-colored stool, dark yellow or brown urine, yellowing skin or eyes, unusual weakness or fatigue Low red blood cell level--unusual weakness or fatigue, dizziness, headache, trouble breathing Lung injury--shortness of breath or trouble breathing, cough, spitting up blood, chest pain, fever Stomach pain, bloody diarrhea, pale skin, unusual weakness or fatigue, decrease in  the amount of urine, which may be signs of hemolytic uremic syndrome Sudden and severe headache, confusion, change in vision, seizures, which may be signs of posterior reversible encephalopathy syndrome (PRES) Unusual bruising or bleeding Side effects that usually do not require medical attention (report to your care team if they continue or are bothersome): Diarrhea Drowsiness Hair loss Nausea Pain, redness, or swelling with sores inside the mouth or throat Vomiting This list may not describe all possible side effects. Call your doctor for medical advice about side effects. You may report side effects to FDA at 1-800-FDA-1088. Where should I keep my medication? This medication is given in a hospital or clinic. It will not be stored at home. NOTE: This sheet is a summary. It may not cover all possible information. If you have questions about this medicine, talk to your doctor, pharmacist, or health care provider.  2023 Elsevier/Gold Standard (2007-07-18 00:00:00)

## 2022-07-04 NOTE — Progress Notes (Signed)
[facility] OFFICE PROGRESS NOTE   Diagnosis: Pancreas cancer  INTERVAL HISTORY:   Alyssa Ross completed another treatment with gemcitabine on 06/20/2022.  She reports improvement in peripheral neuropathy symptoms.  No new complaint.  Objective:  Vital signs in last 24 hours:  Blood pressure 118/78, pulse 67, temperature 98.1 F (36.7 C), temperature source Oral, resp. rate 18, height 5\' 7"  (1.702 m), weight 195 lb (88.5 kg), last menstrual period 09/17/2012, SpO2 100 %.    HEENT: No thrush or ulcers Resp: Lungs clear bilaterally Cardio: Regular rate and rhythm GI: No hepatosplenomegaly nontender, no mass Vascular: No leg edema  Skin: Hyperpigmented rash over the face and chest  Portacath/PICC-without erythema  Lab Results:  Lab Results  Component Value Date   WBC 3.5 (L) 07/04/2022   HGB 9.1 (L) 07/04/2022   HCT 29.1 (L) 07/04/2022   MCV 95.1 07/04/2022   PLT 292 07/04/2022   NEUTROABS 2.0 07/04/2022    CMP  Lab Results  Component Value Date   NA 140 07/04/2022   K 3.9 07/04/2022   CL 110 07/04/2022   CO2 24 07/04/2022   GLUCOSE 116 (H) 07/04/2022   BUN 11 07/04/2022   CREATININE 1.20 (H) 07/04/2022   CALCIUM 8.9 07/04/2022   PROT 6.9 07/04/2022   ALBUMIN 3.6 07/04/2022   AST 24 07/04/2022   ALT 26 07/04/2022   ALKPHOS 145 (H) 07/04/2022   BILITOT 0.4 07/04/2022   GFRNONAA 53 (L) 07/04/2022   GFRAA >60 02/07/2020    Lab Results  Component Value Date   RXV400 53 (H) 06/20/2022   Medications: I have reviewed the patient's current medications.   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 Tumor invades the duodenal wall and vascular groove, resection margins negative, 4/34 lymph nodes positive MSI-stable, tumor showed instability in 2 loci as did adjacent normal tissue Foundation 1-K-ras G12 V, microsatellite status and tumor mutation burden cannot be determined EUS FNA biopsy  of pancreas mass on 07/03/2018-well-differentiated neuroendocrine tumor CTs 01/29/2019-ill-defined pancreas head mass, 5 pulmonary nodules-1 with a small amount of central cavitation, tumor abuts the left margin of the portal vein indistinct density surrounding, hepatic artery, complex cystic lesion of the right kidney, right adrenal mass-characterized as an adenoma on a [facility] MRI 12/21/2018 [facility] 03/03/2019-no focal pancreas activity, no tracer accumulation within the suspicious pulmonary nodules, left uterine mass with tracer accumulation felt to represent a leiomyoma Elevated preoperative CA 19-9--CA 19-9 186 on 01/18/2019 CT chest 04/16/2019-multiple bilateral pulmonary nodules, some with increased cavitation, stable in size Cycle 1 FOLFIRINOX 04/27/2019 Cycle 2 FOLFIRINOX 05/11/2019 Cycle 3 FOLFIRINOX 05/23/2019 Cycle 4 FOLFIRINOX 06/08/2019 Cycle 5 FOLFIRINOX 06/22/2019 CT chest 07/02/2019-stable size of bilateral pulmonary nodules.  Dominant cavitary lesions in both lungs show increased cavitation with thinner walls.  Stable 2.1 cm right adrenal nodule. 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 No definite evidence of metastatic disease in the abdomen or pelvis. Markedly enlarged and heterogeneous appearing uterus, likely to represent multifocal fibroids. 1 of these lesions appears to be an exophytic subserosal fibroid in the posterior lateral aspect of the uterine body on the left side although this comes in  close proximity to the left adnexa such that a primary ovarian lesion is  difficult to completely exclude. CTs 02/07/2020-slight enlargement of bilateral lung nodules, some are cavitary, no evidence of metastatic disease in the abdomen or pelvis, stable right adrenal nodule, uterine fibroids CTs 04/26/2020-mild enlargement of pulmonary nodules, slight increase in trace pelvic fluid, new soft tissue thickening inferior to the cecal tip suspicious for peritoneal metastasis CT 05/26/2020-improved appearance of soft tissue at the inferior tip of the cecum, mildly thickened short appendix-findings suggestive of resolving appendicitis, stable small bibasilar pulmonary nodules, fibroids Plan biopsy of right cecal tip soft tissue canceled secondary to radiologic improvement CT chest 08/02/2020-enlargement and progressive cavitation of multiple bilateral lung nodules.  Some new nodules are present. CTs 10/24/2020- increase in size of pulmonary nodules, no new nodules, no evidence of metastatic disease in the abdomen, stable right adrenal nodule CT 01/09/2021-slight interval enlargement of pulmonary nodules, stable right adrenal nodule Navigation bronchoscopy 01/30/2021-left lower lobe cavitary nodule FNA-adenocarcinoma, brushing-adenocarcinoma.  Left lower lobe lavage-adenocarcinoma.  Right upper lobe brushing and FNA biopsy of cavitary nodule-adenocarcinoma-immunohistochemical profile consistent with pancreas adenocarcinoma Cycle 1 gemcitabine/Abraxane 03/28/2021 Cycle 2 gemcitabine/Abraxane 04/11/2021 Cycle 3 gemcitabine/Abraxane 04/25/2021 Cycle 4 gemcitabine/Abraxane 05/09/2021 Cycle 5 gemcitabine/Abraxane 05/23/2021 CT chest 06/05/2021-interval cavitation of multiple pulmonary nodules, some nodules have decreased in size, no new or enlarging nodules Cycle 6 gemcitabine/Abraxane 06/06/2021 Cycle 7 gemcitabine/Abraxane 06/21/2021 Cycle 8 gemcitabine/Abraxane 07/05/2021 Cycle 9 Gemcitabine/Abraxane 07/19/2021 Cycle 10 gemcitabine 08/01/2021-Abraxane held secondary to neuropathy CT chest  08/13/2021-mild decrease in size and wall thickness of multiple cavitary nodules, no new or progressive disease in the chest, indeterminate low-attenuation right liver lesions Cycle 11 gemcitabine 08/16/2021-Abraxane held secondary to neuropathy Cycle 12 gemcitabine 08/30/2021-Abraxane held secondary to neuropathy Cycle 13 gemcitabine 09/13/2021-Abraxane held secondary to neuropathy Cycle 14 gemcitabine 09/27/2021-Abraxane held secondary to neuropathy Cycle 15 gemcitabine 10/11/2021-Abraxane held secondary to neuropathy CTs 10/22/2021-no change in multiple cavitary lung nodules, no evidence of disease progression, ill-defined hypodense lesion in the posterior right liver suspicious for metastatic disease Cycle 16 gemcitabine 10/25/2021 Cycle 17 gemcitabine 11/08/2021 Cycle 18 Gemcitabine 11/22/2021 Cycle 19 gemcitabine 12/06/2021 Cycle 20 Gemcitabine 12/20/2021 CT 12/31/2021-mild increase in size of bilateral pulmonary metastases, stable subtle continuation right liver lesions Cycle 20 gemcitabine/Abraxane 01/03/2022 Cycle 21 gemcitabine/Abraxane 01/17/2022 Cycle 22 gemcitabine/Abraxane 01/31/2022 Cycle 23 gemcitabine/Abraxane 02/14/2022 Cycle 24 gemcitabine/Abraxane 02/28/2022 CTs 03/11/2022-widespread metastatic disease to the lungs again noted with slight involution of several of the pulmonary nodules, no definite new nodules noted; interval cavitation of solid lesion in the posterior aspect right lobe of the liver, no new liver lesions noted. Cycle 25 Gemcitabine/Abraxane 03/14/2022 Cycle 26 gemcitabine/Abraxane 03/28/2022 Cycle 27 gemcitabine/Abraxane 04/25/2022 Cycle 28 gemcitabine/Abraxane 05/09/2022 Cycle 29 Gemcitabine 05/23/2022, Abraxane held due to neuropathy CTs 06/04/2022-stable multifocal cavitary pulmonary nodules.  No definite new nodules.  No new nodules greater than a centimeter.  Decreased size of the right posterior hepatic lobe lesion.  Generalized heterogeneity and new focal areas of hypodensity  in the liver. 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  She continues systemic treatment with gemcitabine.  Neuropathy symptoms have improved since Abraxane was placed back on hold.  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  The Abraxane will remain on hold for now.  She will complete treatment with gemcitabine today.  Alyssa Ross will return for an office visit and chemotherapy in 2 weeks.  She will be scheduled for restaging CTs in late March or early April.  [name], MD  07/04/2022  9:22 AM

## 2022-07-04 NOTE — Progress Notes (Signed)
Patient seen by Dr. Benay Spice today  Vitals are within treatment parameters.   Labs reviewed by Dr. Benay Spice and are within treatment parameters.  Per physician team, patient is ready for treatment. Please note, abraxane is being removed from treatment plan today.

## 2022-07-05 ENCOUNTER — Other Ambulatory Visit: Payer: Self-pay

## 2022-07-06 LAB — CANCER ANTIGEN 19-9: CA 19-9: 50 U/mL — ABNORMAL HIGH (ref 0–35)

## 2022-07-14 ENCOUNTER — Other Ambulatory Visit: Payer: Self-pay | Admitting: Oncology

## 2022-07-15 DIAGNOSIS — Z1231 Encounter for screening mammogram for malignant neoplasm of breast: Secondary | ICD-10-CM | POA: Diagnosis not present

## 2022-07-17 ENCOUNTER — Other Ambulatory Visit: Payer: Self-pay | Admitting: Internal Medicine

## 2022-07-17 ENCOUNTER — Other Ambulatory Visit: Payer: Self-pay

## 2022-07-18 ENCOUNTER — Encounter: Payer: Self-pay | Admitting: *Deleted

## 2022-07-18 ENCOUNTER — Inpatient Hospital Stay: Payer: BC Managed Care – PPO | Admitting: Oncology

## 2022-07-18 ENCOUNTER — Inpatient Hospital Stay: Payer: BC Managed Care – PPO

## 2022-07-18 ENCOUNTER — Inpatient Hospital Stay: Payer: BC Managed Care – PPO | Attending: Genetic Counselor

## 2022-07-18 VITALS — BP 109/71 | HR 54 | Resp 18

## 2022-07-18 DIAGNOSIS — Z79899 Other long term (current) drug therapy: Secondary | ICD-10-CM | POA: Insufficient documentation

## 2022-07-18 DIAGNOSIS — I10 Essential (primary) hypertension: Secondary | ICD-10-CM | POA: Insufficient documentation

## 2022-07-18 DIAGNOSIS — Z85528 Personal history of other malignant neoplasm of kidney: Secondary | ICD-10-CM | POA: Insufficient documentation

## 2022-07-18 DIAGNOSIS — Z5111 Encounter for antineoplastic chemotherapy: Secondary | ICD-10-CM | POA: Diagnosis not present

## 2022-07-18 DIAGNOSIS — C7801 Secondary malignant neoplasm of right lung: Secondary | ICD-10-CM | POA: Insufficient documentation

## 2022-07-18 DIAGNOSIS — Z905 Acquired absence of kidney: Secondary | ICD-10-CM | POA: Diagnosis not present

## 2022-07-18 DIAGNOSIS — E1140 Type 2 diabetes mellitus with diabetic neuropathy, unspecified: Secondary | ICD-10-CM | POA: Diagnosis not present

## 2022-07-18 DIAGNOSIS — C7802 Secondary malignant neoplasm of left lung: Secondary | ICD-10-CM | POA: Insufficient documentation

## 2022-07-18 DIAGNOSIS — N6321 Unspecified lump in the left breast, upper outer quadrant: Secondary | ICD-10-CM | POA: Insufficient documentation

## 2022-07-18 DIAGNOSIS — C250 Malignant neoplasm of head of pancreas: Secondary | ICD-10-CM | POA: Insufficient documentation

## 2022-07-18 LAB — CBC WITH DIFFERENTIAL (CANCER CENTER ONLY)
Abs Immature Granulocytes: 0.01 10*3/uL (ref 0.00–0.07)
Basophils Absolute: 0.1 10*3/uL (ref 0.0–0.1)
Basophils Relative: 1 %
Eosinophils Absolute: 0.1 10*3/uL (ref 0.0–0.5)
Eosinophils Relative: 2 %
HCT: 30.8 % — ABNORMAL LOW (ref 36.0–46.0)
Hemoglobin: 9.6 g/dL — ABNORMAL LOW (ref 12.0–15.0)
Immature Granulocytes: 0 %
Lymphocytes Relative: 24 %
Lymphs Abs: 1 10*3/uL (ref 0.7–4.0)
MCH: 30.1 pg (ref 26.0–34.0)
MCHC: 31.2 g/dL (ref 30.0–36.0)
MCV: 96.6 fL (ref 80.0–100.0)
Monocytes Absolute: 0.5 10*3/uL (ref 0.1–1.0)
Monocytes Relative: 12 %
Neutro Abs: 2.6 10*3/uL (ref 1.7–7.7)
Neutrophils Relative %: 61 %
Platelet Count: 269 10*3/uL (ref 150–400)
RBC: 3.19 MIL/uL — ABNORMAL LOW (ref 3.87–5.11)
RDW: 15.3 % (ref 11.5–15.5)
WBC Count: 4.3 10*3/uL (ref 4.0–10.5)
nRBC: 0 % (ref 0.0–0.2)

## 2022-07-18 LAB — CMP (CANCER CENTER ONLY)
ALT: 23 U/L (ref 0–44)
AST: 24 U/L (ref 15–41)
Albumin: 3.7 g/dL (ref 3.5–5.0)
Alkaline Phosphatase: 146 U/L — ABNORMAL HIGH (ref 38–126)
Anion gap: 6 (ref 5–15)
BUN: 7 mg/dL (ref 6–20)
CO2: 22 mmol/L (ref 22–32)
Calcium: 8.8 mg/dL — ABNORMAL LOW (ref 8.9–10.3)
Chloride: 110 mmol/L (ref 98–111)
Creatinine: 1.28 mg/dL — ABNORMAL HIGH (ref 0.44–1.00)
GFR, Estimated: 49 mL/min — ABNORMAL LOW (ref >60)
Glucose, Bld: 150 mg/dL — ABNORMAL HIGH (ref 70–99)
Potassium: 3.7 mmol/L (ref 3.5–5.1)
Sodium: 138 mmol/L (ref 135–145)
Total Bilirubin: 0.4 mg/dL (ref 0.3–1.2)
Total Protein: 6.9 g/dL (ref 6.5–8.1)

## 2022-07-18 LAB — MAGNESIUM: Magnesium: 1.9 mg/dL (ref 1.7–2.4)

## 2022-07-18 MED ORDER — SODIUM CHLORIDE 0.9% FLUSH
10.0000 mL | INTRAVENOUS | Status: DC | PRN
  Administered 2022-07-18: 10 mL

## 2022-07-18 MED ORDER — HEPARIN SOD (PORK) LOCK FLUSH 100 UNIT/ML IV SOLN
500.0000 [IU] | Freq: Once | INTRAVENOUS | Status: AC | PRN
  Administered 2022-07-18: 500 [IU]

## 2022-07-18 MED ORDER — SODIUM CHLORIDE 0.9 % IV SOLN
2000.0000 mg | Freq: Once | INTRAVENOUS | Status: AC
  Administered 2022-07-18: 2000 mg via INTRAVENOUS
  Filled 2022-07-18: qty 52.6

## 2022-07-18 MED ORDER — PROCHLORPERAZINE MALEATE 10 MG PO TABS
10.0000 mg | ORAL_TABLET | Freq: Once | ORAL | Status: AC
  Administered 2022-07-18: 10 mg via ORAL
  Filled 2022-07-18: qty 1

## 2022-07-18 MED ORDER — SODIUM CHLORIDE 0.9 % IV SOLN: Freq: Once | INTRAVENOUS | Status: AC

## 2022-07-18 NOTE — Patient Instructions (Signed)
[facility]   Discharge Instructions: Thank you for choosing [facility] to provide your oncology and hematology care.   If you have a lab appointment with the [facility], please go directly to the [facility] and check in at the registration area.   Wear comfortable clothing and clothing appropriate for easy access to any Portacath or PICC line.   We strive to give you quality time with your provider. You may need to reschedule your appointment if you arrive late (15 or more minutes).  Arriving late affects you and other patients whose appointments are after yours.  Also, if you miss three or more appointments without notifying the office, you may be dismissed from the clinic at the provider's discretion.      For prescription refill requests, have your pharmacy contact our office and allow 72 hours for refills to be completed.    Today you received the following chemotherapy and/or immunotherapy agents Gemcitabine (GEMZAR).      To help prevent nausea and vomiting after your treatment, we encourage you to take your nausea medication as directed.  BELOW ARE SYMPTOMS THAT SHOULD BE REPORTED IMMEDIATELY: *FEVER GREATER THAN 100.4 F (38 C) OR HIGHER *CHILLS OR SWEATING *NAUSEA AND VOMITING THAT IS NOT CONTROLLED WITH YOUR NAUSEA MEDICATION *UNUSUAL SHORTNESS OF BREATH *UNUSUAL BRUISING OR BLEEDING *URINARY PROBLEMS (pain or burning when urinating, or frequent urination) *BOWEL PROBLEMS (unusual diarrhea, constipation, pain near the anus) TENDERNESS IN MOUTH AND THROAT WITH OR WITHOUT PRESENCE OF ULCERS (sore throat, sores in mouth, or a toothache) UNUSUAL RASH, SWELLING OR PAIN  UNUSUAL VAGINAL DISCHARGE OR ITCHING   Items with * indicate a potential emergency and should be followed up as soon as possible or go to the Emergency Department if any problems should occur.  Please show the CHEMOTHERAPY ALERT CARD or IMMUNOTHERAPY ALERT  CARD at check-in to the Emergency Department and triage nurse.  Should you have questions after your visit or need to cancel or reschedule your appointment, please contact [facility]  Dept: [phone]  and follow the prompts.  Office hours are 8:00 a.m. to 4:30 p.m. Monday - Friday. Please note that voicemails left after 4:00 p.m. may not be returned until the following business day.  We are closed weekends and major holidays. You have access to a nurse at all times for urgent questions. Please call the main number to the clinic Dept: [phone] and follow the prompts.   For any non-urgent questions, you may also contact your provider using MyChart. We now offer e-Visits for anyone 14 and older to request care online for non-urgent symptoms. For details visit [email or web address].   Also download the MyChart app! Go to the app store, search "MyChart", open the app, select [facility], and log in with your MyChart username and password.  Gemcitabine Injection What is this medication? GEMCITABINE (jem SYE ta been) treats some types of cancer. It works by slowing down the growth of cancer cells. This medicine may be used for other purposes; ask your health care provider or pharmacist if you have questions. COMMON BRAND NAME(S): Gemzar, Infugem What should I tell my care team before I take this medication? They need to know if you have any of these conditions: Blood disorders Infection Kidney disease Liver disease Lung or breathing disease, such as asthma or COPD Recent or ongoing radiation therapy An unusual or allergic reaction to gemcitabine, other medications, foods, dyes, or  preservatives If you or your partner are pregnant or trying to get pregnant Breast-feeding How should I use this medication? This medication is injected into a vein. It is given by your care team in a hospital or clinic setting. Talk to your care team about the use of this  medication in children. Special care may be needed. Overdosage: If you think you have taken too much of this medicine contact a poison control center or emergency room at once. NOTE: This medicine is only for you. Do not share this medicine with others. What if I miss a dose? Keep appointments for follow-up doses. It is important not to miss your dose. Call your care team if you are unable to keep an appointment. What may interact with this medication? Interactions have not been studied. This list may not describe all possible interactions. Give your health care provider a list of all the medicines, herbs, non-prescription drugs, or dietary supplements you use. Also tell them if you smoke, drink alcohol, or use illegal drugs. Some items may interact with your medicine. What should I watch for while using this medication? Your condition will be monitored carefully while you are receiving this medication. This medication may make you feel generally unwell. This is not uncommon, as chemotherapy can affect healthy cells as well as cancer cells. Report any side effects. Continue your course of treatment even though you feel ill unless your care team tells you to stop. In some cases, you may be given additional medications to help with side effects. Follow all directions for their use. This medication may increase your risk of getting an infection. Call your care team for advice if you get a fever, chills, sore throat, or other symptoms of a cold or flu. Do not treat yourself. Try to avoid being around people who are sick. This medication may increase your risk to bruise or bleed. Call your care team if you notice any unusual bleeding. Be careful brushing or flossing your teeth or using a toothpick because you may get an infection or bleed more easily. If you have any dental work done, tell your dentist you are receiving this medication. Avoid taking medications that contain aspirin, acetaminophen,  ibuprofen, naproxen, or ketoprofen unless instructed by your care team. These medications may hide a fever. Talk to your care team if you or your partner wish to become pregnant or think you might be pregnant. This medication can cause serious birth defects if taken during pregnancy and for 6 months after the last dose. A negative pregnancy test is required before starting this medication. A reliable form of contraception is recommended while taking this medication and for 6 months after the last dose. Talk to your care team about effective forms of contraception. Do not father a child while taking this medication and for 3 months after the last dose. Use a condom while having sex during this time period. Do not breastfeed while taking this medication and for at least 1 week after the last dose. This medication may cause infertility. Talk to your care team if you are concerned about your fertility. What side effects may I notice from receiving this medication? Side effects that you should report to your care team as soon as possible: Allergic reactions--skin rash, itching, hives, swelling of the face, lips, tongue, or throat Capillary leak syndrome--stomach or muscle pain, unusual weakness or fatigue, feeling faint or lightheaded, decrease in the amount of urine, swelling of the ankles, hands, or feet, trouble breathing Infection--fever,  chills, cough, sore throat, wounds that don't heal, pain or trouble when passing urine, general feeling of discomfort or being unwell Liver injury--right upper belly pain, loss of appetite, nausea, light-colored stool, dark yellow or brown urine, yellowing skin or eyes, unusual weakness or fatigue Low red blood cell level--unusual weakness or fatigue, dizziness, headache, trouble breathing Lung injury--shortness of breath or trouble breathing, cough, spitting up blood, chest pain, fever Stomach pain, bloody diarrhea, pale skin, unusual weakness or fatigue, decrease in  the amount of urine, which may be signs of hemolytic uremic syndrome Sudden and severe headache, confusion, change in vision, seizures, which may be signs of posterior reversible encephalopathy syndrome (PRES) Unusual bruising or bleeding Side effects that usually do not require medical attention (report to your care team if they continue or are bothersome): Diarrhea Drowsiness Hair loss Nausea Pain, redness, or swelling with sores inside the mouth or throat Vomiting This list may not describe all possible side effects. Call your doctor for medical advice about side effects. You may report side effects to FDA at 1-800-FDA-1088. Where should I keep my medication? This medication is given in a hospital or clinic. It will not be stored at home. NOTE: This sheet is a summary. It may not cover all possible information. If you have questions about this medicine, talk to your doctor, pharmacist, or health care provider.  2023 Elsevier/Gold Standard (2007-07-18 00:00:00)

## 2022-07-18 NOTE — Progress Notes (Signed)
[facility] OFFICE PROGRESS NOTE   Diagnosis: Pancreas cancer  INTERVAL HISTORY:   Alyssa Ross returns as scheduled.  She completed another treatment with gemcitabine 07/04/2022.  No change in neuropathy symptoms.  The face rash has improved.  She is working.  No new complaint.  Objective:  Vital signs in last 24 hours:  Blood pressure 122/76, pulse 70, temperature 98.1 F (36.7 C), temperature source Oral, resp. rate 18, height 5\' 7"  (1.702 m), weight 195 lb 3.2 oz (88.5 kg), last menstrual period 09/17/2012, SpO2 100 %.    HEENT: No thrush Resp: Lungs clear bilaterally Cardio: Regular rate and rhythm GI: No hepatosplenomegaly, nontender, no mass Vascular: No leg edema    Portacath/PICC-without erythema  Lab Results:  Lab Results  Component Value Date   WBC 4.3 07/18/2022   HGB 9.6 (L) 07/18/2022   HCT 30.8 (L) 07/18/2022   MCV 96.6 07/18/2022   PLT 269 07/18/2022   NEUTROABS 2.6 07/18/2022    CMP  Lab Results  Component Value Date   NA 138 07/18/2022   K 3.7 07/18/2022   CL 110 07/18/2022   CO2 22 07/18/2022   GLUCOSE 150 (H) 07/18/2022   BUN 7 07/18/2022   CREATININE 1.28 (H) 07/18/2022   CALCIUM 8.8 (L) 07/18/2022   PROT 6.9 07/18/2022   ALBUMIN 3.7 07/18/2022   AST 24 07/18/2022   ALT 23 07/18/2022   ALKPHOS 146 (H) 07/18/2022   BILITOT 0.4 07/18/2022   GFRNONAA 49 (L) 07/18/2022   GFRAA >60 02/07/2020    Lab Results  Component Value Date   GUR427 50 (H) 07/04/2022    Medications: I have reviewed the patient's current medications.   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 Tumor invades the duodenal wall and vascular groove, resection margins negative, 4/34 lymph nodes positive MSI-stable, tumor showed instability in 2 loci as did adjacent normal tissue Foundation 1-K-ras G12 V, microsatellite status and tumor mutation burden cannot be determined EUS FNA biopsy of  pancreas mass on 07/03/2018-well-differentiated neuroendocrine tumor CTs 01/29/2019-ill-defined pancreas head mass, 5 pulmonary nodules-1 with a small amount of central cavitation, tumor abuts the left margin of the portal vein indistinct density surrounding, hepatic artery, complex cystic lesion of the right kidney, right adrenal mass-characterized as an adenoma on a [facility] MRI 12/21/2018 [facility] 03/03/2019-no focal pancreas activity, no tracer accumulation within the suspicious pulmonary nodules, left uterine mass with tracer accumulation felt to represent a leiomyoma Elevated preoperative CA 19-9--CA 19-9 186 on 01/18/2019 CT chest 04/16/2019-multiple bilateral pulmonary nodules, some with increased cavitation, stable in size Cycle 1 FOLFIRINOX 04/27/2019 Cycle 2 FOLFIRINOX 05/11/2019 Cycle 3 FOLFIRINOX 05/23/2019 Cycle 4 FOLFIRINOX 06/08/2019 Cycle 5 FOLFIRINOX 06/22/2019 CT chest 07/02/2019-stable size of bilateral pulmonary nodules.  Dominant cavitary lesions in both lungs show increased cavitation with thinner walls.  Stable 2.1 cm right adrenal nodule. 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 No definite evidence of metastatic disease in the abdomen or pelvis. Markedly enlarged and heterogeneous appearing uterus, likely to represent multifocal fibroids. 1 of these lesions appears to be an exophytic subserosal fibroid in the posterior lateral aspect of the uterine body on the left  side although this comes in close proximity to the left adnexa such that a primary ovarian lesion is difficult  to completely exclude. CTs 02/07/2020-slight enlargement of bilateral lung nodules, some are cavitary, no evidence of metastatic disease in the abdomen or pelvis, stable right adrenal nodule, uterine fibroids CTs 04/26/2020-mild enlargement of pulmonary nodules, slight increase in trace pelvic fluid, new soft tissue thickening inferior to the cecal tip suspicious for peritoneal metastasis CT 05/26/2020-improved appearance of soft tissue at the inferior tip of the cecum, mildly thickened short appendix-findings suggestive of resolving appendicitis, stable small bibasilar pulmonary nodules, fibroids Plan biopsy of right cecal tip soft tissue canceled secondary to radiologic improvement CT chest 08/02/2020-enlargement and progressive cavitation of multiple bilateral lung nodules.  Some new nodules are present. CTs 10/24/2020- increase in size of pulmonary nodules, no new nodules, no evidence of metastatic disease in the abdomen, stable right adrenal nodule CT 01/09/2021-slight interval enlargement of pulmonary nodules, stable right adrenal nodule Navigation bronchoscopy 01/30/2021-left lower lobe cavitary nodule FNA-adenocarcinoma, brushing-adenocarcinoma.  Left lower lobe lavage-adenocarcinoma.  Right upper lobe brushing and FNA biopsy of cavitary nodule-adenocarcinoma-immunohistochemical profile consistent with pancreas adenocarcinoma Cycle 1 gemcitabine/Abraxane 03/28/2021 Cycle 2 gemcitabine/Abraxane 04/11/2021 Cycle 3 gemcitabine/Abraxane 04/25/2021 Cycle 4 gemcitabine/Abraxane 05/09/2021 Cycle 5 gemcitabine/Abraxane 05/23/2021 CT chest 06/05/2021-interval cavitation of multiple pulmonary nodules, some nodules have decreased in size, no new or enlarging nodules Cycle 6 gemcitabine/Abraxane 06/06/2021 Cycle 7 gemcitabine/Abraxane 06/21/2021 Cycle 8 gemcitabine/Abraxane 07/05/2021 Cycle 9 Gemcitabine/Abraxane 07/19/2021 Cycle 10 gemcitabine 08/01/2021-Abraxane held secondary to neuropathy CT chest  08/13/2021-mild decrease in size and wall thickness of multiple cavitary nodules, no new or progressive disease in the chest, indeterminate low-attenuation right liver lesions Cycle 11 gemcitabine 08/16/2021-Abraxane held secondary to neuropathy Cycle 12 gemcitabine 08/30/2021-Abraxane held secondary to neuropathy Cycle 13 gemcitabine 09/13/2021-Abraxane held secondary to neuropathy Cycle 14 gemcitabine 09/27/2021-Abraxane held secondary to neuropathy Cycle 15 gemcitabine 10/11/2021-Abraxane held secondary to neuropathy CTs 10/22/2021-no change in multiple cavitary lung nodules, no evidence of disease progression, ill-defined hypodense lesion in the posterior right liver suspicious for metastatic disease Cycle 16 gemcitabine 10/25/2021 Cycle 17 gemcitabine 11/08/2021 Cycle 18 Gemcitabine 11/22/2021 Cycle 19 gemcitabine 12/06/2021 Cycle 20 Gemcitabine 12/20/2021 CT 12/31/2021-mild increase in size of bilateral pulmonary metastases, stable subtle continuation right liver lesions Cycle 20 gemcitabine/Abraxane 01/03/2022 Cycle 21 gemcitabine/Abraxane 01/17/2022 Cycle 22 gemcitabine/Abraxane 01/31/2022 Cycle 23 gemcitabine/Abraxane 02/14/2022 Cycle 24 gemcitabine/Abraxane 02/28/2022 CTs 03/11/2022-widespread metastatic disease to the lungs again noted with slight involution of several of the pulmonary nodules, no definite new nodules noted; interval cavitation of solid lesion in the posterior aspect right lobe of the liver, no new liver lesions noted. Cycle 25 Gemcitabine/Abraxane 03/14/2022 Cycle 26 gemcitabine/Abraxane 03/28/2022 Cycle 27 gemcitabine/Abraxane 04/25/2022 Cycle 28 gemcitabine/Abraxane 05/09/2022 Cycle 29 Gemcitabine 05/23/2022, Abraxane held due to neuropathy CTs 06/04/2022-stable multifocal cavitary pulmonary nodules.  No definite new nodules.  No new nodules greater than a centimeter.  Decreased size of the right posterior hepatic lobe lesion.  Generalized heterogeneity and new focal areas of hypodensity  in the liver. 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  She will complete another treatment with gemcitabine today.  Abraxane remains on hold due to persistent neuropathy symptoms.  The CA 19-9 was stable when she was here 2 weeks ago.  We will follow-up on the CA 19-9 from today.  Ms. Gombos will return for an office visit and chemotherapy in 2 weeks.  She will be scheduled for restaging CTs in late March.  [name], MD  07/18/2022  11:31 AM

## 2022-07-18 NOTE — Progress Notes (Signed)
Patient seen by Dr. Benay Spice today  Vitals are within treatment parameters.  Labs reviewed by Dr. Benay Spice:  and are within treatment parameters.  Per physician team, patient is ready for treatment. Please note that modifications are being made to the treatment plan including Abraxane is being held again today.

## 2022-07-20 LAB — CANCER ANTIGEN 19-9: CA 19-9: 57 U/mL — ABNORMAL HIGH (ref 0–35)

## 2022-07-31 ENCOUNTER — Encounter: Payer: Self-pay | Admitting: Oncology

## 2022-08-01 ENCOUNTER — Inpatient Hospital Stay: Payer: BC Managed Care – PPO

## 2022-08-01 ENCOUNTER — Inpatient Hospital Stay: Payer: BC Managed Care – PPO | Admitting: Oncology

## 2022-08-10 ENCOUNTER — Other Ambulatory Visit: Payer: Self-pay | Admitting: Oncology

## 2022-08-15 ENCOUNTER — Inpatient Hospital Stay: Payer: BC Managed Care – PPO | Attending: Genetic Counselor

## 2022-08-15 ENCOUNTER — Other Ambulatory Visit: Payer: Self-pay | Admitting: *Deleted

## 2022-08-15 ENCOUNTER — Inpatient Hospital Stay: Payer: BC Managed Care – PPO | Admitting: Oncology

## 2022-08-15 ENCOUNTER — Inpatient Hospital Stay: Payer: BC Managed Care – PPO

## 2022-08-15 VITALS — BP 119/79 | HR 56 | Resp 18

## 2022-08-15 VITALS — BP 123/78 | HR 71 | Temp 98.1°F | Resp 18 | Ht 67.0 in | Wt 197.5 lb

## 2022-08-15 DIAGNOSIS — G620 Drug-induced polyneuropathy: Secondary | ICD-10-CM | POA: Insufficient documentation

## 2022-08-15 DIAGNOSIS — Z5111 Encounter for antineoplastic chemotherapy: Secondary | ICD-10-CM | POA: Insufficient documentation

## 2022-08-15 DIAGNOSIS — Z905 Acquired absence of kidney: Secondary | ICD-10-CM | POA: Insufficient documentation

## 2022-08-15 DIAGNOSIS — C250 Malignant neoplasm of head of pancreas: Secondary | ICD-10-CM

## 2022-08-15 DIAGNOSIS — E119 Type 2 diabetes mellitus without complications: Secondary | ICD-10-CM | POA: Diagnosis not present

## 2022-08-15 DIAGNOSIS — N6320 Unspecified lump in the left breast, unspecified quadrant: Secondary | ICD-10-CM | POA: Diagnosis not present

## 2022-08-15 DIAGNOSIS — C7802 Secondary malignant neoplasm of left lung: Secondary | ICD-10-CM | POA: Insufficient documentation

## 2022-08-15 DIAGNOSIS — D649 Anemia, unspecified: Secondary | ICD-10-CM | POA: Insufficient documentation

## 2022-08-15 DIAGNOSIS — Z800 Family history of malignant neoplasm of digestive organs: Secondary | ICD-10-CM | POA: Diagnosis not present

## 2022-08-15 DIAGNOSIS — I10 Essential (primary) hypertension: Secondary | ICD-10-CM | POA: Diagnosis not present

## 2022-08-15 DIAGNOSIS — T451X5D Adverse effect of antineoplastic and immunosuppressive drugs, subsequent encounter: Secondary | ICD-10-CM | POA: Diagnosis not present

## 2022-08-15 DIAGNOSIS — Z79899 Other long term (current) drug therapy: Secondary | ICD-10-CM | POA: Insufficient documentation

## 2022-08-15 DIAGNOSIS — C7801 Secondary malignant neoplasm of right lung: Secondary | ICD-10-CM | POA: Diagnosis not present

## 2022-08-15 LAB — CBC WITH DIFFERENTIAL (CANCER CENTER ONLY)
Abs Immature Granulocytes: 0.02 10*3/uL (ref 0.00–0.07)
Basophils Absolute: 0 10*3/uL (ref 0.0–0.1)
Basophils Relative: 1 %
Eosinophils Absolute: 0.1 10*3/uL (ref 0.0–0.5)
Eosinophils Relative: 2 %
HCT: 31.1 % — ABNORMAL LOW (ref 36.0–46.0)
Hemoglobin: 9.9 g/dL — ABNORMAL LOW (ref 12.0–15.0)
Immature Granulocytes: 1 %
Lymphocytes Relative: 26 %
Lymphs Abs: 1.1 10*3/uL (ref 0.7–4.0)
MCH: 30.2 pg (ref 26.0–34.0)
MCHC: 31.8 g/dL (ref 30.0–36.0)
MCV: 94.8 fL (ref 80.0–100.0)
Monocytes Absolute: 0.4 10*3/uL (ref 0.1–1.0)
Monocytes Relative: 11 %
Neutro Abs: 2.4 10*3/uL (ref 1.7–7.7)
Neutrophils Relative %: 59 %
Platelet Count: 350 10*3/uL (ref 150–400)
RBC: 3.28 MIL/uL — ABNORMAL LOW (ref 3.87–5.11)
RDW: 14.1 % (ref 11.5–15.5)
WBC Count: 4.1 10*3/uL (ref 4.0–10.5)
nRBC: 0 % (ref 0.0–0.2)

## 2022-08-15 LAB — CMP (CANCER CENTER ONLY)
ALT: 28 U/L (ref 0–44)
AST: 21 U/L (ref 15–41)
Albumin: 3.8 g/dL (ref 3.5–5.0)
Alkaline Phosphatase: 166 U/L — ABNORMAL HIGH (ref 38–126)
Anion gap: 5 (ref 5–15)
BUN: 14 mg/dL (ref 6–20)
CO2: 22 mmol/L (ref 22–32)
Calcium: 9 mg/dL (ref 8.9–10.3)
Chloride: 108 mmol/L (ref 98–111)
Creatinine: 1.41 mg/dL — ABNORMAL HIGH (ref 0.44–1.00)
GFR, Estimated: 44 mL/min — ABNORMAL LOW (ref >60)
Glucose, Bld: 220 mg/dL — ABNORMAL HIGH (ref 70–99)
Potassium: 4.2 mmol/L (ref 3.5–5.1)
Sodium: 135 mmol/L (ref 135–145)
Total Bilirubin: 0.5 mg/dL (ref 0.3–1.2)
Total Protein: 7.3 g/dL (ref 6.5–8.1)

## 2022-08-15 LAB — MAGNESIUM: Magnesium: 1.8 mg/dL (ref 1.7–2.4)

## 2022-08-15 MED ORDER — SODIUM CHLORIDE 0.9 % IV SOLN: Freq: Once | INTRAVENOUS | Status: AC

## 2022-08-15 MED ORDER — PROCHLORPERAZINE MALEATE 10 MG PO TABS
10.0000 mg | ORAL_TABLET | Freq: Once | ORAL | Status: AC
  Administered 2022-08-15: 10 mg via ORAL
  Filled 2022-08-15: qty 1

## 2022-08-15 MED ORDER — SODIUM CHLORIDE 0.9 % IV SOLN
2000.0000 mg | Freq: Once | INTRAVENOUS | Status: AC
  Administered 2022-08-15: 2000 mg via INTRAVENOUS
  Filled 2022-08-15: qty 52.6

## 2022-08-15 MED ORDER — HEPARIN SOD (PORK) LOCK FLUSH 100 UNIT/ML IV SOLN
500.0000 [IU] | Freq: Once | INTRAVENOUS | Status: AC | PRN
  Administered 2022-08-15: 500 [IU]

## 2022-08-15 MED ORDER — SODIUM CHLORIDE 0.9% FLUSH
10.0000 mL | INTRAVENOUS | Status: DC | PRN
  Administered 2022-08-15: 10 mL

## 2022-08-15 NOTE — Patient Instructions (Signed)
Alyssa Ross   Discharge Instructions: Thank you for choosing [facility] to provide your oncology and hematology care.   If you have a lab appointment with the [facility], please go directly to the [facility] and check in at the registration area.   Wear comfortable clothing and clothing appropriate for easy access to any Portacath or PICC line.   We strive to give you quality time with your provider. You may need to reschedule your appointment if you arrive late (15 or more minutes).  Arriving late affects you and other patients whose appointments are after yours.  Also, if you miss three or more appointments without notifying the office, you may be dismissed from the clinic at the provider's discretion.      For prescription refill requests, have your pharmacy contact our office and allow 72 hours for refills to be completed.    Today you received the following chemotherapy and/or immunotherapy agents Gemcitabine (GEMZAR).      To help prevent nausea and vomiting after your treatment, we encourage you to take your nausea medication as directed.  BELOW ARE SYMPTOMS THAT SHOULD BE REPORTED IMMEDIATELY: *FEVER GREATER THAN 100.4 F (38 C) OR HIGHER *CHILLS OR SWEATING *NAUSEA AND VOMITING THAT IS NOT CONTROLLED WITH YOUR NAUSEA MEDICATION *UNUSUAL SHORTNESS OF BREATH *UNUSUAL BRUISING OR BLEEDING *URINARY PROBLEMS (pain or burning when urinating, or frequent urination) *BOWEL PROBLEMS (unusual diarrhea, constipation, pain near the anus) TENDERNESS IN MOUTH AND THROAT WITH OR WITHOUT PRESENCE OF ULCERS (sore throat, sores in mouth, or a toothache) UNUSUAL RASH, SWELLING OR PAIN  UNUSUAL VAGINAL DISCHARGE OR ITCHING   Items with * indicate a potential emergency and should be followed up as soon as possible or go to the Emergency Department if any problems should occur.  Please show the CHEMOTHERAPY ALERT CARD or IMMUNOTHERAPY ALERT  CARD at check-in to the Emergency Department and triage nurse.  Should you have questions after your visit or need to cancel or reschedule your appointment, please contact [facility]  Dept: [phone]  and follow the prompts.  Office hours are 8:00 a.m. to 4:30 p.m. Monday - Friday. Please note that voicemails left after 4:00 p.m. may not be returned until the following business day.  We are closed weekends and major holidays. You have access to a nurse at all times for urgent questions. Please call the main number to the clinic Dept: [phone] and follow the prompts.   For any non-urgent questions, you may also contact your provider using MyChart. We now offer e-Visits for anyone 14 and older to request care online for non-urgent symptoms. For details visit [email or web address].   Also download the MyChart app! Go to the app store, search "MyChart", open the app, select [facility], and log in with your MyChart username and password.  Gemcitabine Injection What is this medication? GEMCITABINE (jem SYE ta been) treats some types of cancer. It works by slowing down the growth of cancer cells. This medicine may be used for other purposes; ask your health care provider or pharmacist if you have questions. COMMON BRAND NAME(S): Gemzar, Infugem What should I tell my care team before I take this medication? They need to know if you have any of these conditions: Blood disorders Infection Kidney disease Liver disease Lung or breathing disease, such as asthma or COPD Recent or ongoing radiation therapy An unusual or allergic reaction to gemcitabine, other medications, foods, dyes, or  preservatives If you or your partner are pregnant or trying to get pregnant Breast-feeding How should I use this medication? This medication is injected into a vein. It is given by your care team in a hospital or clinic setting. Talk to your care team about the use of this  medication in children. Special care may be needed. Overdosage: If you think you have taken too much of this medicine contact a poison control center or emergency room at once. NOTE: This medicine is only for you. Do not share this medicine with others. What if I miss a dose? Keep appointments for follow-up doses. It is important not to miss your dose. Call your care team if you are unable to keep an appointment. What may interact with this medication? Interactions have not been studied. This list may not describe all possible interactions. Give your health care provider a list of all the medicines, herbs, non-prescription drugs, or dietary supplements you use. Also tell them if you smoke, drink alcohol, or use illegal drugs. Some items may interact with your medicine. What should I watch for while using this medication? Your condition will be monitored carefully while you are receiving this medication. This medication may make you feel generally unwell. This is not uncommon, as chemotherapy can affect healthy cells as well as cancer cells. Report any side effects. Continue your course of treatment even though you feel ill unless your care team tells you to stop. In some cases, you may be given additional medications to help with side effects. Follow all directions for their use. This medication may increase your risk of getting an infection. Call your care team for advice if you get a fever, chills, sore throat, or other symptoms of a cold or flu. Do not treat yourself. Try to avoid being around people who are sick. This medication may increase your risk to bruise or bleed. Call your care team if you notice any unusual bleeding. Be careful brushing or flossing your teeth or using a toothpick because you may get an infection or bleed more easily. If you have any dental work done, tell your dentist you are receiving this medication. Avoid taking medications that contain aspirin, acetaminophen,  ibuprofen, naproxen, or ketoprofen unless instructed by your care team. These medications may hide a fever. Talk to your care team if you or your partner wish to become pregnant or think you might be pregnant. This medication can cause serious birth defects if taken during pregnancy and for 6 months after the last dose. A negative pregnancy test is required before starting this medication. A reliable form of contraception is recommended while taking this medication and for 6 months after the last dose. Talk to your care team about effective forms of contraception. Do not father a child while taking this medication and for 3 months after the last dose. Use a condom while having sex during this time period. Do not breastfeed while taking this medication and for at least 1 week after the last dose. This medication may cause infertility. Talk to your care team if you are concerned about your fertility. What side effects may I notice from receiving this medication? Side effects that you should report to your care team as soon as possible: Allergic reactions--skin rash, itching, hives, swelling of the face, lips, tongue, or throat Capillary leak syndrome--stomach or muscle pain, unusual weakness or fatigue, feeling faint or lightheaded, decrease in the amount of urine, swelling of the ankles, hands, or feet, trouble breathing Infection--fever,  chills, cough, sore throat, wounds that don't heal, pain or trouble when passing urine, general feeling of discomfort or being unwell Liver injury--right upper belly pain, loss of appetite, nausea, light-colored stool, dark yellow or brown urine, yellowing skin or eyes, unusual weakness or fatigue Low red blood cell level--unusual weakness or fatigue, dizziness, headache, trouble breathing Lung injury--shortness of breath or trouble breathing, cough, spitting up blood, chest pain, fever Stomach pain, bloody diarrhea, pale skin, unusual weakness or fatigue, decrease in  the amount of urine, which may be signs of hemolytic uremic syndrome Sudden and severe headache, confusion, change in vision, seizures, which may be signs of posterior reversible encephalopathy syndrome (PRES) Unusual bruising or bleeding Side effects that usually do not require medical attention (report to your care team if they continue or are bothersome): Diarrhea Drowsiness Hair loss Nausea Pain, redness, or swelling with sores inside the mouth or throat Vomiting This list may not describe all possible side effects. Call your doctor for medical advice about side effects. You may report side effects to FDA at 1-800-FDA-1088. Where should I keep my medication? This medication is given in a hospital or clinic. It will not be stored at home. NOTE: This sheet is a summary. It may not cover all possible information. If you have questions about this medicine, talk to your doctor, pharmacist, or health care provider.  2023 Elsevier/Gold Standard (2021-10-02 00:00:00)

## 2022-08-15 NOTE — Progress Notes (Signed)
Ms. Douthat requesting referral to [facility] dermatology group at [facility] for her facial dermatitis. Multiple attempts to reach practice today at [phone] without success. Will try again tomorrow.

## 2022-08-15 NOTE — Progress Notes (Signed)
Patient seen by Dr. [name] today  Vitals are within treatment parameters.   Labs reviewed by Dr. Benay Spice and are within treatment parameters.  Per physician team, patient is ready for treatment and there are NO modifications to the treatment plan.

## 2022-08-15 NOTE — Progress Notes (Signed)
[place] OFFICE PROGRESS NOTE   Diagnosis: Pancreas cancer  INTERVAL HISTORY:   [name] is the last scheduled chemotherapy treatment due to an upper respiratory infection.  She feels well today.  Good appetite.  She is working.  Stable neuropathy symptoms.  She has discomfort in the right abdomen when she coughs.  No consistent pain.  Objective:  Vital signs in last 24 hours:  Blood pressure 123/78, pulse 71, temperature 98.1 F (36.7 C), temperature source Oral, resp. rate 18, height '5\' 7"'$  (1.702 m), weight 197 lb 8 oz (89.6 kg), last menstrual period 09/17/2012, SpO2 100 %.    HEENT: No thrush or ulcers Resp: Lungs clear bilaterally Cardio: Regular rate and rhythm GI: Nontender, no hepatosplenomegaly, fullness in the right compared to the left mid abdomen without a discrete mass. Vascular: No leg edema  Skin: Acne type rash over the face  Portacath/PICC-without erythema  Lab Results:  Lab Results  Component Value Date   WBC 4.1 08/15/2022   HGB 9.9 (L) 08/15/2022   HCT 31.1 (L) 08/15/2022   MCV 94.8 08/15/2022   PLT 350 08/15/2022   NEUTROABS 2.4 08/15/2022    CMP  Lab Results  Component Value Date   NA 138 07/18/2022   K 3.7 07/18/2022   CL 110 07/18/2022   CO2 22 07/18/2022   GLUCOSE 150 (H) 07/18/2022   BUN 7 07/18/2022   CREATININE 1.28 (H) 07/18/2022   CALCIUM 8.8 (L) 07/18/2022   PROT 6.9 07/18/2022   ALBUMIN 3.7 07/18/2022   AST 24 07/18/2022   ALT 23 07/18/2022   ALKPHOS 146 (H) 07/18/2022   BILITOT 0.4 07/18/2022   GFRNONAA 49 (L) 07/18/2022   GFRAA >60 02/07/2020    Lab Results  Component Value Date   [id number] (H) 07/18/2022    Medications: I have reviewed the patient's current medications.   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 Tumor invades the duodenal wall and vascular groove, resection margins negative, 4/34 lymph nodes positive MSI-stable,  tumor showed instability in 2 loci as did adjacent normal tissue Foundation 1-K-ras G12 V, microsatellite status and tumor mutation burden cannot be determined EUS FNA biopsy of pancreas mass on 07/03/2018-well-differentiated neuroendocrine tumor CTs 01/29/2019-ill-defined pancreas head mass, 5 pulmonary nodules-1 with a small amount of central cavitation, tumor abuts the left margin of the portal vein indistinct density surrounding, hepatic artery, complex cystic lesion of the right kidney, right adrenal mass-characterized as an adenoma on a [facility] MRI 12/21/2018 [facility] 03/03/2019-no focal pancreas activity, no tracer accumulation within the suspicious pulmonary nodules, left uterine mass with tracer accumulation felt to represent a leiomyoma Elevated preoperative CA 19-9--CA 19-9 186 on 01/18/2019 CT chest 04/16/2019-multiple bilateral pulmonary nodules, some with increased cavitation, stable in size Cycle 1 FOLFIRINOX 04/27/2019 Cycle 2 FOLFIRINOX 05/11/2019 Cycle 3 FOLFIRINOX 05/23/2019 Cycle 4 FOLFIRINOX 06/08/2019 Cycle 5 FOLFIRINOX 06/22/2019 CT chest 07/02/2019-stable size of bilateral pulmonary nodules.  Dominant cavitary lesions in both lungs show increased cavitation with thinner walls.  Stable 2.1 cm right adrenal nodule. 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 No definite evidence of metastatic disease in the abdomen or pelvis. Markedly enlarged  and heterogeneous appearing uterus, likely to represent multifocal fibroids. 1 of these lesions appears to be an exophytic subserosal  fibroid in the posterior lateral aspect of the uterine body on the left side although this comes in close proximity to the left adnexa such that a primary ovarian lesion is difficult to completely exclude. CTs 02/07/2020-slight enlargement of bilateral lung nodules, some are cavitary, no evidence of metastatic disease in the abdomen or pelvis, stable right adrenal nodule, uterine fibroids CTs 04/26/2020-mild enlargement of pulmonary nodules, slight increase in trace pelvic fluid, new soft tissue thickening inferior to the cecal tip suspicious for peritoneal metastasis CT 05/26/2020-improved appearance of soft tissue at the inferior tip of the cecum, mildly thickened short appendix-findings suggestive of resolving appendicitis, stable small bibasilar pulmonary nodules, fibroids Plan biopsy of right cecal tip soft tissue canceled secondary to radiologic improvement CT chest 08/02/2020-enlargement and progressive cavitation of multiple bilateral lung nodules.  Some new nodules are present. CTs 10/24/2020- increase in size of pulmonary nodules, no new nodules, no evidence of metastatic disease in the abdomen, stable right adrenal nodule CT 01/09/2021-slight interval enlargement of pulmonary nodules, stable right adrenal nodule Navigation bronchoscopy 01/30/2021-left lower lobe cavitary nodule FNA-adenocarcinoma, brushing-adenocarcinoma.  Left lower lobe lavage-adenocarcinoma.  Right upper lobe brushing and FNA biopsy of cavitary nodule-adenocarcinoma-immunohistochemical profile consistent with pancreas adenocarcinoma Cycle 1 gemcitabine/Abraxane 03/28/2021 Cycle 2 gemcitabine/Abraxane 04/11/2021 Cycle 3 gemcitabine/Abraxane 04/25/2021 Cycle 4 gemcitabine/Abraxane 05/09/2021 Cycle 5 gemcitabine/Abraxane 05/23/2021 CT chest 06/05/2021-interval cavitation of multiple pulmonary nodules, some nodules have decreased in size, no new or enlarging nodules Cycle 6 gemcitabine/Abraxane 06/06/2021 Cycle 7  gemcitabine/Abraxane 06/21/2021 Cycle 8 gemcitabine/Abraxane 07/05/2021 Cycle 9 Gemcitabine/Abraxane 07/19/2021 Cycle 10 gemcitabine 08/01/2021-Abraxane held secondary to neuropathy CT chest 08/13/2021-mild decrease in size and wall thickness of multiple cavitary nodules, no new or progressive disease in the chest, indeterminate low-attenuation right liver lesions Cycle 11 gemcitabine 08/16/2021-Abraxane held secondary to neuropathy Cycle 12 gemcitabine 08/30/2021-Abraxane held secondary to neuropathy Cycle 13 gemcitabine 09/13/2021-Abraxane held secondary to neuropathy Cycle 14 gemcitabine 09/27/2021-Abraxane held secondary to neuropathy Cycle 15 gemcitabine 10/11/2021-Abraxane held secondary to neuropathy CTs 10/22/2021-no change in multiple cavitary lung nodules, no evidence of disease progression, ill-defined hypodense lesion in the posterior right liver suspicious for metastatic disease Cycle 16 gemcitabine 10/25/2021 Cycle 17 gemcitabine 11/08/2021 Cycle 18 Gemcitabine 11/22/2021 Cycle 19 gemcitabine 12/06/2021 Cycle 20 Gemcitabine 12/20/2021 CT 12/31/2021-mild increase in size of bilateral pulmonary metastases, stable subtle continuation right liver lesions Cycle 20 gemcitabine/Abraxane 01/03/2022 Cycle 21 gemcitabine/Abraxane 01/17/2022 Cycle 22 gemcitabine/Abraxane 01/31/2022 Cycle 23 gemcitabine/Abraxane 02/14/2022 Cycle 24 gemcitabine/Abraxane 02/28/2022 CTs 03/11/2022-widespread metastatic disease to the lungs again noted with slight involution of several of the pulmonary nodules, no definite new nodules noted; interval cavitation of solid lesion in the posterior aspect right lobe of the liver, no new liver lesions noted. Cycle 25 Gemcitabine/Abraxane 03/14/2022 Cycle 26 gemcitabine/Abraxane 03/28/2022 Cycle 27 gemcitabine/Abraxane 04/25/2022 Cycle 28 gemcitabine/Abraxane 05/09/2022 Cycle 29 Gemcitabine 05/23/2022, Abraxane held due to neuropathy CTs 06/04/2022-stable multifocal cavitary pulmonary nodules.   No definite new nodules.  No new nodules greater than a centimeter.  Decreased size of the right posterior hepatic lobe lesion.  Generalized heterogeneity and new focal areas of hypodensity in the liver. 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  She will complete another treatment with gemcitabine today.  We will follow-up on the CA 19-9 from today.  She will return for an office visit and gemcitabine in 2 weeks.  She will be referred for a restaging CT evaluation after the next cycle of chemotherapy. We will include a CT abdomen/pelvis on the next restaging evaluation.  We will order a CT sooner if she has increased or consistent abdominal pain.   [name], MD  08/15/2022  9:27 AM

## 2022-08-17 LAB — CANCER ANTIGEN 19-9: CA 19-9: 97 U/mL — ABNORMAL HIGH (ref 0–35)

## 2022-08-21 ENCOUNTER — Telehealth: Payer: Self-pay | Admitting: *Deleted

## 2022-08-21 NOTE — Telephone Encounter (Signed)
Called [name] w/her dermatology appointment w/Dr. Shanon Brow on 09/26/22 at 0900 w/arrival at [facility].

## 2022-08-29 ENCOUNTER — Inpatient Hospital Stay: Payer: BC Managed Care – PPO

## 2022-08-29 ENCOUNTER — Inpatient Hospital Stay: Payer: BC Managed Care – PPO | Admitting: Oncology

## 2022-08-29 ENCOUNTER — Encounter: Payer: Self-pay | Admitting: *Deleted

## 2022-08-29 ENCOUNTER — Encounter: Payer: BC Managed Care – PPO | Admitting: Internal Medicine

## 2022-08-29 VITALS — BP 116/78 | HR 50 | Resp 18

## 2022-08-29 DIAGNOSIS — N6320 Unspecified lump in the left breast, unspecified quadrant: Secondary | ICD-10-CM | POA: Diagnosis not present

## 2022-08-29 DIAGNOSIS — Z5111 Encounter for antineoplastic chemotherapy: Secondary | ICD-10-CM | POA: Diagnosis not present

## 2022-08-29 DIAGNOSIS — C250 Malignant neoplasm of head of pancreas: Secondary | ICD-10-CM

## 2022-08-29 DIAGNOSIS — C7802 Secondary malignant neoplasm of left lung: Secondary | ICD-10-CM | POA: Diagnosis not present

## 2022-08-29 DIAGNOSIS — Z79899 Other long term (current) drug therapy: Secondary | ICD-10-CM | POA: Diagnosis not present

## 2022-08-29 DIAGNOSIS — E119 Type 2 diabetes mellitus without complications: Secondary | ICD-10-CM | POA: Diagnosis not present

## 2022-08-29 DIAGNOSIS — Z800 Family history of malignant neoplasm of digestive organs: Secondary | ICD-10-CM | POA: Diagnosis not present

## 2022-08-29 DIAGNOSIS — I10 Essential (primary) hypertension: Secondary | ICD-10-CM | POA: Diagnosis not present

## 2022-08-29 DIAGNOSIS — C7801 Secondary malignant neoplasm of right lung: Secondary | ICD-10-CM | POA: Diagnosis not present

## 2022-08-29 DIAGNOSIS — D649 Anemia, unspecified: Secondary | ICD-10-CM | POA: Diagnosis not present

## 2022-08-29 DIAGNOSIS — G620 Drug-induced polyneuropathy: Secondary | ICD-10-CM | POA: Diagnosis not present

## 2022-08-29 DIAGNOSIS — Z905 Acquired absence of kidney: Secondary | ICD-10-CM | POA: Diagnosis not present

## 2022-08-29 DIAGNOSIS — T451X5D Adverse effect of antineoplastic and immunosuppressive drugs, subsequent encounter: Secondary | ICD-10-CM | POA: Diagnosis not present

## 2022-08-29 LAB — CBC WITH DIFFERENTIAL (CANCER CENTER ONLY)
Abs Immature Granulocytes: 0.01 10*3/uL (ref 0.00–0.07)
Basophils Absolute: 0 10*3/uL (ref 0.0–0.1)
Basophils Relative: 1 %
Eosinophils Absolute: 0.1 10*3/uL (ref 0.0–0.5)
Eosinophils Relative: 3 %
HCT: 29.3 % — ABNORMAL LOW (ref 36.0–46.0)
Hemoglobin: 9.3 g/dL — ABNORMAL LOW (ref 12.0–15.0)
Immature Granulocytes: 0 %
Lymphocytes Relative: 28 %
Lymphs Abs: 1.2 10*3/uL (ref 0.7–4.0)
MCH: 30.2 pg (ref 26.0–34.0)
MCHC: 31.7 g/dL (ref 30.0–36.0)
MCV: 95.1 fL (ref 80.0–100.0)
Monocytes Absolute: 0.4 10*3/uL (ref 0.1–1.0)
Monocytes Relative: 9 %
Neutro Abs: 2.6 10*3/uL (ref 1.7–7.7)
Neutrophils Relative %: 59 %
Platelet Count: 270 10*3/uL (ref 150–400)
RBC: 3.08 MIL/uL — ABNORMAL LOW (ref 3.87–5.11)
RDW: 14.5 % (ref 11.5–15.5)
WBC Count: 4.4 10*3/uL (ref 4.0–10.5)
nRBC: 0 % (ref 0.0–0.2)

## 2022-08-29 LAB — CMP (CANCER CENTER ONLY)
ALT: 39 U/L (ref 0–44)
AST: 31 U/L (ref 15–41)
Albumin: 3.5 g/dL (ref 3.5–5.0)
Alkaline Phosphatase: 146 U/L — ABNORMAL HIGH (ref 38–126)
Anion gap: 3 — ABNORMAL LOW (ref 5–15)
BUN: 10 mg/dL (ref 6–20)
CO2: 24 mmol/L (ref 22–32)
Calcium: 8.5 mg/dL — ABNORMAL LOW (ref 8.9–10.3)
Chloride: 113 mmol/L — ABNORMAL HIGH (ref 98–111)
Creatinine: 1.18 mg/dL — ABNORMAL HIGH (ref 0.44–1.00)
GFR, Estimated: 54 mL/min — ABNORMAL LOW (ref >60)
Glucose, Bld: 140 mg/dL — ABNORMAL HIGH (ref 70–99)
Potassium: 3.7 mmol/L (ref 3.5–5.1)
Sodium: 140 mmol/L (ref 135–145)
Total Bilirubin: 0.4 mg/dL (ref 0.3–1.2)
Total Protein: 6.5 g/dL (ref 6.5–8.1)

## 2022-08-29 MED ORDER — HEPARIN SOD (PORK) LOCK FLUSH 100 UNIT/ML IV SOLN
500.0000 [IU] | Freq: Once | INTRAVENOUS | Status: AC | PRN
  Administered 2022-08-29: 500 [IU]

## 2022-08-29 MED ORDER — PROCHLORPERAZINE MALEATE 10 MG PO TABS
10.0000 mg | ORAL_TABLET | Freq: Once | ORAL | Status: AC
  Administered 2022-08-29: 10 mg via ORAL
  Filled 2022-08-29: qty 1

## 2022-08-29 MED ORDER — SODIUM CHLORIDE 0.9% FLUSH
10.0000 mL | INTRAVENOUS | Status: DC | PRN
  Administered 2022-08-29: 10 mL

## 2022-08-29 MED ORDER — SODIUM CHLORIDE 0.9 % IV SOLN: Freq: Once | INTRAVENOUS | Status: AC

## 2022-08-29 MED ORDER — SODIUM CHLORIDE 0.9 % IV SOLN
2000.0000 mg | Freq: Once | INTRAVENOUS | Status: AC
  Administered 2022-08-29: 2000 mg via INTRAVENOUS
  Filled 2022-08-29: qty 52.6

## 2022-08-29 NOTE — Progress Notes (Signed)
Patient seen by Dr. Benay Spice today  Vitals are within treatment parameters.No intervention needed for BP 124/86  Labs reviewed by Dr. Benay Spice and are within treatment parameters.  Per physician team, patient is ready for treatment and there are NO modifications to the treatment plan.  Continue to hold Abraxane

## 2022-08-29 NOTE — Patient Instructions (Signed)
[facility]   Discharge Instructions: Thank you for choosing [facility] to provide your oncology and hematology care.   If you have a lab appointment with the Cancer Center, please go directly to the Cancer Center and check in at the registration area.   Wear comfortable clothing and clothing appropriate for easy access to any Portacath or PICC line.   We strive to give you quality time with your provider. You may need to reschedule your appointment if you arrive late (15 or more minutes).  Arriving late affects you and other patients whose appointments are after yours.  Also, if you miss three or more appointments without notifying the office, you may be dismissed from the clinic at the provider's discretion.      For prescription refill requests, have your pharmacy contact our office and allow 72 hours for refills to be completed.    Today you received the following chemotherapy and/or immunotherapy agents Gemcitabine (GEMZAR).      To help prevent nausea and vomiting after your treatment, we encourage you to take your nausea medication as directed.  BELOW ARE SYMPTOMS THAT SHOULD BE REPORTED IMMEDIATELY: *FEVER GREATER THAN 100.4 F (38 C) OR HIGHER *CHILLS OR SWEATING *NAUSEA AND VOMITING THAT IS NOT CONTROLLED WITH YOUR NAUSEA MEDICATION *UNUSUAL SHORTNESS OF BREATH *UNUSUAL BRUISING OR BLEEDING *URINARY PROBLEMS (pain or burning when urinating, or frequent urination) *BOWEL PROBLEMS (unusual diarrhea, constipation, pain near the anus) TENDERNESS IN MOUTH AND THROAT WITH OR WITHOUT PRESENCE OF ULCERS (sore throat, sores in mouth, or a toothache) UNUSUAL RASH, SWELLING OR PAIN  UNUSUAL VAGINAL DISCHARGE OR ITCHING   Items with * indicate a potential emergency and should be followed up as soon as possible or go to the Emergency Department if any problems should occur.  Please show the CHEMOTHERAPY ALERT CARD or IMMUNOTHERAPY ALERT  CARD at check-in to the Emergency Department and triage nurse.  Should you have questions after your visit or need to cancel or reschedule your appointment, please contact [facility]  Dept: [phone]  and follow the prompts.  Office hours are 8:00 a.m. to 4:30 p.m. Monday - Friday. Please note that voicemails left after 4:00 p.m. may not be returned until the following business day.  We are closed weekends and major holidays. You have access to a nurse at all times for urgent questions. Please call the main number to the clinic Dept: [phone] and follow the prompts.   For any non-urgent questions, you may also contact your provider using MyChart. We now offer e-Visits for anyone 18 and older to request care online for non-urgent symptoms. For details visit [email or web address].   Also download the MyChart app! Go to the app store, search "MyChart", open the app, select [facility], and log in with your MyChart username and password.  Gemcitabine Injection What is this medication? GEMCITABINE (jem SYE ta been) treats some types of cancer. It works by slowing down the growth of cancer cells. This medicine may be used for other purposes; ask your health care provider or pharmacist if you have questions. COMMON BRAND NAME(S): Gemzar, Infugem What should I tell my care team before I take this medication? They need to know if you have any of these conditions: Blood disorders Infection Kidney disease Liver disease Lung or breathing disease, such as asthma or COPD Recent or ongoing radiation therapy An unusual or allergic reaction to gemcitabine, other medications, foods, dyes, or   preservatives If you or your partner are pregnant or trying to get pregnant Breast-feeding How should I use this medication? This medication is injected into a vein. It is given by your care team in a hospital or clinic setting. Talk to your care team about the use of this  medication in children. Special care may be needed. Overdosage: If you think you have taken too much of this medicine contact a poison control center or emergency room at once. NOTE: This medicine is only for you. Do not share this medicine with others. What if I miss a dose? Keep appointments for follow-up doses. It is important not to miss your dose. Call your care team if you are unable to keep an appointment. What may interact with this medication? Interactions have not been studied. This list may not describe all possible interactions. Give your health care provider a list of all the medicines, herbs, non-prescription drugs, or dietary supplements you use. Also tell them if you smoke, drink alcohol, or use illegal drugs. Some items may interact with your medicine. What should I watch for while using this medication? Your condition will be monitored carefully while you are receiving this medication. This medication may make you feel generally unwell. This is not uncommon, as chemotherapy can affect healthy cells as well as cancer cells. Report any side effects. Continue your course of treatment even though you feel ill unless your care team tells you to stop. In some cases, you may be given additional medications to help with side effects. Follow all directions for their use. This medication may increase your risk of getting an infection. Call your care team for advice if you get a fever, chills, sore throat, or other symptoms of a cold or flu. Do not treat yourself. Try to avoid being around people who are sick. This medication may increase your risk to bruise or bleed. Call your care team if you notice any unusual bleeding. Be careful brushing or flossing your teeth or using a toothpick because you may get an infection or bleed more easily. If you have any dental work done, tell your dentist you are receiving this medication. Avoid taking medications that contain aspirin, acetaminophen,  ibuprofen, naproxen, or ketoprofen unless instructed by your care team. These medications may hide a fever. Talk to your care team if you or your partner wish to become pregnant or think you might be pregnant. This medication can cause serious birth defects if taken during pregnancy and for 6 months after the last dose. A negative pregnancy test is required before starting this medication. A reliable form of contraception is recommended while taking this medication and for 6 months after the last dose. Talk to your care team about effective forms of contraception. Do not father a child while taking this medication and for 3 months after the last dose. Use a condom while having sex during this time period. Do not breastfeed while taking this medication and for at least 1 week after the last dose. This medication may cause infertility. Talk to your care team if you are concerned about your fertility. What side effects may I notice from receiving this medication? Side effects that you should report to your care team as soon as possible: Allergic reactions--skin rash, itching, hives, swelling of the face, lips, tongue, or throat Capillary leak syndrome--stomach or muscle pain, unusual weakness or fatigue, feeling faint or lightheaded, decrease in the amount of urine, swelling of the ankles, hands, or feet, trouble breathing Infection--fever,   chills, cough, sore throat, wounds that don't heal, pain or trouble when passing urine, general feeling of discomfort or being unwell Liver injury--right upper belly pain, loss of appetite, nausea, light-colored stool, dark yellow or brown urine, yellowing skin or eyes, unusual weakness or fatigue Low red blood cell level--unusual weakness or fatigue, dizziness, headache, trouble breathing Lung injury--shortness of breath or trouble breathing, cough, spitting up blood, chest pain, fever Stomach pain, bloody diarrhea, pale skin, unusual weakness or fatigue, decrease in  the amount of urine, which may be signs of hemolytic uremic syndrome Sudden and severe headache, confusion, change in vision, seizures, which may be signs of posterior reversible encephalopathy syndrome (PRES) Unusual bruising or bleeding Side effects that usually do not require medical attention (report to your care team if they continue or are bothersome): Diarrhea Drowsiness Hair loss Nausea Pain, redness, or swelling with sores inside the mouth or throat Vomiting This list may not describe all possible side effects. Call your doctor for medical advice about side effects. You may report side effects to FDA at 1-800-FDA-1088. Where should I keep my medication? This medication is given in a hospital or clinic. It will not be stored at home. NOTE: This sheet is a summary. It may not cover all possible information. If you have questions about this medicine, talk to your doctor, pharmacist, or health care provider.  2023 Elsevier/Gold Standard (2021-10-02 00:00:00)  

## 2022-08-29 NOTE — Progress Notes (Signed)
[place] OFFICE PROGRESS NOTE   Diagnosis: Pancreas cancer  INTERVAL HISTORY:   Ms. Hanas returns as scheduled.  She completed another cycle of gemcitabine on 08/15/2022.  No change in neuropathy symptoms.  No new complaint.  Is a persistent rash. Objective:  Vital signs in last 24 hours:  Blood pressure 124/86, pulse 73, temperature 98.2 F (36.8 C), temperature source Oral, resp. rate 18, height 5\' 7"  (1.702 m), weight 199 lb 3.2 oz (90.4 kg), last menstrual period 09/17/2012, SpO2 100 %.    HEENT: No thrush or ulcers Resp: Lungs clear bilaterally Cardio: Regular rate and rhythm GI: Nontender, no hepatosplenomegaly, no mass Vascular: No leg edema  Skin: Acne type rash over the face  Portacath/PICC-without erythema  Lab Results:  Lab Results  Component Value Date   WBC 4.4 08/29/2022   HGB 9.3 (L) 08/29/2022   HCT 29.3 (L) 08/29/2022   MCV 95.1 08/29/2022   PLT 270 08/29/2022   NEUTROABS 2.6 08/29/2022    CMP  Lab Results  Component Value Date   NA 140 08/29/2022   K 3.7 08/29/2022   CL 113 (H) 08/29/2022   CO2 24 08/29/2022   GLUCOSE 140 (H) 08/29/2022   BUN 10 08/29/2022   CREATININE 1.18 (H) 08/29/2022   CALCIUM 8.5 (L) 08/29/2022   PROT 6.5 08/29/2022   ALBUMIN 3.5 08/29/2022   AST 31 08/29/2022   ALT 39 08/29/2022   ALKPHOS 146 (H) 08/29/2022   BILITOT 0.4 08/29/2022   GFRNONAA 54 (L) 08/29/2022   GFRAA >60 02/07/2020    Lab Results  Component Value Date   CAN199 97 (H) 08/15/2022     Medications: I have reviewed the patient's current medications.   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 Tumor invades the duodenal wall and vascular groove, resection margins negative, 4/34 lymph nodes positive MSI-stable, tumor showed instability in 2 loci as did adjacent normal tissue Foundation 1-K-ras G12 V, microsatellite status and tumor mutation burden cannot be  determined EUS FNA biopsy of pancreas mass on 07/03/2018-well-differentiated neuroendocrine tumor CTs 01/29/2019-ill-defined pancreas head mass, 5 pulmonary nodules-1 with a small amount of central cavitation, tumor abuts the left margin of the portal vein indistinct density surrounding, hepatic artery, complex cystic lesion of the right kidney, right adrenal mass-characterized as an adenoma on a [facility] MRI 12/21/2018 [facility] 03/03/2019-no focal pancreas activity, no tracer accumulation within the suspicious pulmonary nodules, left uterine mass with tracer accumulation felt to represent a leiomyoma Elevated preoperative CA 19-9--CA 19-9 186 on 01/18/2019 CT chest 04/16/2019-multiple bilateral pulmonary nodules, some with increased cavitation, stable in size Cycle 1 FOLFIRINOX 04/27/2019 Cycle 2 FOLFIRINOX 05/11/2019 Cycle 3 FOLFIRINOX 05/23/2019 Cycle 4 FOLFIRINOX 06/08/2019 Cycle 5 FOLFIRINOX 06/22/2019 CT chest 07/02/2019-stable size of bilateral pulmonary nodules.  Dominant cavitary lesions in both lungs show increased cavitation with thinner walls.  Stable 2.1 cm right adrenal nodule. 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 No definite evidence of metastatic disease in the abdomen or pelvis. Markedly enlarged and heterogeneous appearing uterus, likely to represent multifocal fibroids. 1 of these lesions appears to be an exophytic subserosal fibroid in the posterior lateral aspect of  the uterine body on the left side although this comes in close proximity to the left adnexa such that a  primary ovarian lesion is difficult to completely exclude. CTs 02/07/2020-slight enlargement of bilateral lung nodules, some are cavitary, no evidence of metastatic disease in the abdomen or pelvis, stable right adrenal nodule, uterine fibroids CTs 04/26/2020-mild enlargement of pulmonary nodules, slight increase in trace pelvic fluid, new soft tissue thickening inferior to the cecal tip suspicious for peritoneal metastasis CT 05/26/2020-improved appearance of soft tissue at the inferior tip of the cecum, mildly thickened short appendix-findings suggestive of resolving appendicitis, stable small bibasilar pulmonary nodules, fibroids Plan biopsy of right cecal tip soft tissue canceled secondary to radiologic improvement CT chest 08/02/2020-enlargement and progressive cavitation of multiple bilateral lung nodules.  Some new nodules are present. CTs 10/24/2020- increase in size of pulmonary nodules, no new nodules, no evidence of metastatic disease in the abdomen, stable right adrenal nodule CT 01/09/2021-slight interval enlargement of pulmonary nodules, stable right adrenal nodule Navigation bronchoscopy 01/30/2021-left lower lobe cavitary nodule FNA-adenocarcinoma, brushing-adenocarcinoma.  Left lower lobe lavage-adenocarcinoma.  Right upper lobe brushing and FNA biopsy of cavitary nodule-adenocarcinoma-immunohistochemical profile consistent with pancreas adenocarcinoma Cycle 1 gemcitabine/Abraxane 03/28/2021 Cycle 2 gemcitabine/Abraxane 04/11/2021 Cycle 3 gemcitabine/Abraxane 04/25/2021 Cycle 4 gemcitabine/Abraxane 05/09/2021 Cycle 5 gemcitabine/Abraxane 05/23/2021 CT chest 06/05/2021-interval cavitation of multiple pulmonary nodules, some nodules have decreased in size, no new or enlarging nodules Cycle 6 gemcitabine/Abraxane 06/06/2021 Cycle 7 gemcitabine/Abraxane 06/21/2021 Cycle 8 gemcitabine/Abraxane 07/05/2021 Cycle 9 Gemcitabine/Abraxane 07/19/2021 Cycle 10 gemcitabine 08/01/2021-Abraxane held  secondary to neuropathy CT chest 08/13/2021-mild decrease in size and wall thickness of multiple cavitary nodules, no new or progressive disease in the chest, indeterminate low-attenuation right liver lesions Cycle 11 gemcitabine 08/16/2021-Abraxane held secondary to neuropathy Cycle 12 gemcitabine 08/30/2021-Abraxane held secondary to neuropathy Cycle 13 gemcitabine 09/13/2021-Abraxane held secondary to neuropathy Cycle 14 gemcitabine 09/27/2021-Abraxane held secondary to neuropathy Cycle 15 gemcitabine 10/11/2021-Abraxane held secondary to neuropathy CTs 10/22/2021-no change in multiple cavitary lung nodules, no evidence of disease progression, ill-defined hypodense lesion in the posterior right liver suspicious for metastatic disease Cycle 16 gemcitabine 10/25/2021 Cycle 17 gemcitabine 11/08/2021 Cycle 18 Gemcitabine 11/22/2021 Cycle 19 gemcitabine 12/06/2021 Cycle 20 Gemcitabine 12/20/2021 CT 12/31/2021-mild increase in size of bilateral pulmonary metastases, stable subtle continuation right liver lesions Cycle 20 gemcitabine/Abraxane 01/03/2022 Cycle 21 gemcitabine/Abraxane 01/17/2022 Cycle 22 gemcitabine/Abraxane 01/31/2022 Cycle 23 gemcitabine/Abraxane 02/14/2022 Cycle 24 gemcitabine/Abraxane 02/28/2022 CTs 03/11/2022-widespread metastatic disease to the lungs again noted with slight involution of several of the pulmonary nodules, no definite new nodules noted; interval cavitation of solid lesion in the posterior aspect right lobe of the liver, no new liver lesions noted. Cycle 25 Gemcitabine/Abraxane 03/14/2022 Cycle 26 gemcitabine/Abraxane 03/28/2022 Cycle 27 gemcitabine/Abraxane 04/25/2022 Cycle 28 gemcitabine/Abraxane 05/09/2022 Cycle 29 Gemcitabine 05/23/2022, Abraxane held due to neuropathy CTs 06/04/2022-stable multifocal cavitary pulmonary nodules.  No definite new nodules.  No new nodules greater than a centimeter.  Decreased size of the right posterior hepatic lobe lesion.  Generalized heterogeneity  and new focal areas of hypodensity in the liver. 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  She will complete another treatment with gemcitabine today.  The CA 19-9 was slightly higher when she was here 2 weeks ago.  She will undergo a restaging CT evaluation prior to an office visit in 2 weeks.  [name], MD  08/29/2022  10:27 AM

## 2022-08-31 LAB — CANCER ANTIGEN 19-9: CA 19-9: 119 U/mL — ABNORMAL HIGH (ref 0–35)

## 2022-09-03 ENCOUNTER — Encounter: Payer: Self-pay | Admitting: Oncology

## 2022-09-07 ENCOUNTER — Other Ambulatory Visit: Payer: Self-pay | Admitting: Oncology

## 2022-09-09 ENCOUNTER — Other Ambulatory Visit (HOSPITAL_BASED_OUTPATIENT_CLINIC_OR_DEPARTMENT_OTHER): Payer: BC Managed Care – PPO

## 2022-09-10 ENCOUNTER — Ambulatory Visit (HOSPITAL_BASED_OUTPATIENT_CLINIC_OR_DEPARTMENT_OTHER)
Admission: RE | Admit: 2022-09-10 | Discharge: 2022-09-10 | Disposition: A | Discharge status: Home / Self Care | Payer: BC Managed Care – PPO | Source: Ambulatory Visit | Attending: Oncology | Admitting: Oncology

## 2022-09-10 DIAGNOSIS — C250 Malignant neoplasm of head of pancreas: Secondary | ICD-10-CM | POA: Diagnosis not present

## 2022-09-10 DIAGNOSIS — C259 Malignant neoplasm of pancreas, unspecified: Secondary | ICD-10-CM | POA: Diagnosis not present

## 2022-09-10 MED ORDER — IOHEXOL 300 MG/ML  SOLN
100.0000 mL | Freq: Once | INTRAMUSCULAR | Status: AC | PRN
  Administered 2022-09-10: 100 mL via INTRAVENOUS

## 2022-09-12 ENCOUNTER — Inpatient Hospital Stay: Payer: BC Managed Care – PPO | Admitting: Nurse Practitioner

## 2022-09-12 ENCOUNTER — Encounter: Payer: Self-pay | Admitting: *Deleted

## 2022-09-12 ENCOUNTER — Inpatient Hospital Stay: Payer: BC Managed Care – PPO

## 2022-09-12 ENCOUNTER — Other Ambulatory Visit (HOSPITAL_BASED_OUTPATIENT_CLINIC_OR_DEPARTMENT_OTHER): Payer: Self-pay

## 2022-09-12 ENCOUNTER — Telehealth: Payer: Self-pay

## 2022-09-12 ENCOUNTER — Inpatient Hospital Stay: Payer: BC Managed Care – PPO | Attending: Genetic Counselor

## 2022-09-12 ENCOUNTER — Encounter: Payer: Self-pay | Admitting: Nurse Practitioner

## 2022-09-12 VITALS — BP 126/78 | HR 66 | Temp 98.1°F | Resp 18 | Ht 67.0 in | Wt 198.6 lb

## 2022-09-12 VITALS — BP 130/82 | HR 51 | Resp 18

## 2022-09-12 DIAGNOSIS — C7802 Secondary malignant neoplasm of left lung: Secondary | ICD-10-CM | POA: Diagnosis not present

## 2022-09-12 DIAGNOSIS — Z5111 Encounter for antineoplastic chemotherapy: Secondary | ICD-10-CM | POA: Insufficient documentation

## 2022-09-12 DIAGNOSIS — C250 Malignant neoplasm of head of pancreas: Secondary | ICD-10-CM

## 2022-09-12 DIAGNOSIS — E876 Hypokalemia: Secondary | ICD-10-CM

## 2022-09-12 DIAGNOSIS — C7801 Secondary malignant neoplasm of right lung: Secondary | ICD-10-CM | POA: Diagnosis not present

## 2022-09-12 DIAGNOSIS — Z79899 Other long term (current) drug therapy: Secondary | ICD-10-CM | POA: Diagnosis not present

## 2022-09-12 DIAGNOSIS — I10 Essential (primary) hypertension: Secondary | ICD-10-CM | POA: Insufficient documentation

## 2022-09-12 DIAGNOSIS — E1140 Type 2 diabetes mellitus with diabetic neuropathy, unspecified: Secondary | ICD-10-CM | POA: Diagnosis not present

## 2022-09-12 DIAGNOSIS — Z905 Acquired absence of kidney: Secondary | ICD-10-CM | POA: Insufficient documentation

## 2022-09-12 LAB — CMP (CANCER CENTER ONLY)
ALT: 36 U/L (ref 0–44)
AST: 35 U/L (ref 15–41)
Albumin: 3.6 g/dL (ref 3.5–5.0)
Alkaline Phosphatase: 146 U/L — ABNORMAL HIGH (ref 38–126)
Anion gap: 5 (ref 5–15)
BUN: 8 mg/dL (ref 6–20)
CO2: 25 mmol/L (ref 22–32)
Calcium: 8.5 mg/dL — ABNORMAL LOW (ref 8.9–10.3)
Chloride: 107 mmol/L (ref 98–111)
Creatinine: 1.21 mg/dL — ABNORMAL HIGH (ref 0.44–1.00)
GFR, Estimated: 53 mL/min — ABNORMAL LOW (ref >60)
Glucose, Bld: 340 mg/dL — ABNORMAL HIGH (ref 70–99)
Potassium: 3.9 mmol/L (ref 3.5–5.1)
Sodium: 137 mmol/L (ref 135–145)
Total Bilirubin: 0.4 mg/dL (ref 0.3–1.2)
Total Protein: 6.4 g/dL — ABNORMAL LOW (ref 6.5–8.1)

## 2022-09-12 LAB — CBC WITH DIFFERENTIAL (CANCER CENTER ONLY)
Abs Immature Granulocytes: 0.01 10*3/uL (ref 0.00–0.07)
Basophils Absolute: 0 10*3/uL (ref 0.0–0.1)
Basophils Relative: 1 %
Eosinophils Absolute: 0.1 10*3/uL (ref 0.0–0.5)
Eosinophils Relative: 1 %
HCT: 30 % — ABNORMAL LOW (ref 36.0–46.0)
Hemoglobin: 9.4 g/dL — ABNORMAL LOW (ref 12.0–15.0)
Immature Granulocytes: 0 %
Lymphocytes Relative: 19 %
Lymphs Abs: 1 10*3/uL (ref 0.7–4.0)
MCH: 30.3 pg (ref 26.0–34.0)
MCHC: 31.3 g/dL (ref 30.0–36.0)
MCV: 96.8 fL (ref 80.0–100.0)
Monocytes Absolute: 0.6 10*3/uL (ref 0.1–1.0)
Monocytes Relative: 11 %
Neutro Abs: 3.4 10*3/uL (ref 1.7–7.7)
Neutrophils Relative %: 68 %
Platelet Count: 219 10*3/uL (ref 150–400)
RBC: 3.1 MIL/uL — ABNORMAL LOW (ref 3.87–5.11)
RDW: 14.8 % (ref 11.5–15.5)
WBC Count: 5 10*3/uL (ref 4.0–10.5)
nRBC: 0 % (ref 0.0–0.2)

## 2022-09-12 LAB — MAGNESIUM: Magnesium: 1.6 mg/dL — ABNORMAL LOW (ref 1.7–2.4)

## 2022-09-12 MED ORDER — MAGNESIUM OXIDE -MG SUPPLEMENT 400 (240 MG) MG PO TABS
ORAL_TABLET | ORAL | 1 refills | Status: DC
Start: 2022-09-12 — End: 2023-03-21

## 2022-09-12 MED ORDER — SODIUM CHLORIDE 0.9 % IV SOLN: Freq: Once | INTRAVENOUS | Status: AC

## 2022-09-12 MED ORDER — PACLITAXEL PROTEIN-BOUND CHEMO INJECTION 100 MG
100.0000 mg/m2 | Freq: Once | INTRAVENOUS | Status: AC
  Administered 2022-09-12: 200 mg via INTRAVENOUS
  Filled 2022-09-12: qty 40

## 2022-09-12 MED ORDER — SODIUM CHLORIDE 0.9% FLUSH
10.0000 mL | INTRAVENOUS | Status: DC | PRN
  Administered 2022-09-12: 10 mL

## 2022-09-12 MED ORDER — PROCHLORPERAZINE MALEATE 10 MG PO TABS
10.0000 mg | ORAL_TABLET | Freq: Once | ORAL | Status: AC
  Administered 2022-09-12: 10 mg via ORAL
  Filled 2022-09-12: qty 1

## 2022-09-12 MED ORDER — SODIUM CHLORIDE 0.9 % IV SOLN
2000.0000 mg | Freq: Once | INTRAVENOUS | Status: AC
  Administered 2022-09-12: 2000 mg via INTRAVENOUS
  Filled 2022-09-12: qty 52.6

## 2022-09-12 MED ORDER — HEPARIN SOD (PORK) LOCK FLUSH 100 UNIT/ML IV SOLN
500.0000 [IU] | Freq: Once | INTRAVENOUS | Status: AC | PRN
  Administered 2022-09-12: 500 [IU]

## 2022-09-12 MED ORDER — POTASSIUM CHLORIDE CRYS ER 20 MEQ PO TBCR
20.0000 meq | EXTENDED_RELEASE_TABLET | Freq: Two times a day (BID) | ORAL | 1 refills | Status: AC
Start: 2022-09-12 — End: ?

## 2022-09-12 NOTE — Patient Instructions (Signed)
[facility]   Discharge Instructions: Thank you for choosing [facility] to provide your oncology and hematology care.   If you have a lab appointment with the Cancer Center, please go directly to the Cancer Center and check in at the registration area.   Wear comfortable clothing and clothing appropriate for easy access to any Portacath or PICC line.   We strive to give you quality time with your provider. You may need to reschedule your appointment if you arrive late (15 or more minutes).  Arriving late affects you and other patients whose appointments are after yours.  Also, if you miss three or more appointments without notifying the office, you may be dismissed from the clinic at the provider's discretion.      For prescription refill requests, have your pharmacy contact our office and allow 72 hours for refills to be completed.    Today you received the following chemotherapy and/or immunotherapy agents Paclitaxel-protein bound (ABRAXANE) & Gemcitabine (GEMZAR).      To help prevent nausea and vomiting after your treatment, we encourage you to take your nausea medication as directed.  BELOW ARE SYMPTOMS THAT SHOULD BE REPORTED IMMEDIATELY: *FEVER GREATER THAN 100.4 F (38 C) OR HIGHER *CHILLS OR SWEATING *NAUSEA AND VOMITING THAT IS NOT CONTROLLED WITH YOUR NAUSEA MEDICATION *UNUSUAL SHORTNESS OF BREATH *UNUSUAL BRUISING OR BLEEDING *URINARY PROBLEMS (pain or burning when urinating, or frequent urination) *BOWEL PROBLEMS (unusual diarrhea, constipation, pain near the anus) TENDERNESS IN MOUTH AND THROAT WITH OR WITHOUT PRESENCE OF ULCERS (sore throat, sores in mouth, or a toothache) UNUSUAL RASH, SWELLING OR PAIN  UNUSUAL VAGINAL DISCHARGE OR ITCHING   Items with * indicate a potential emergency and should be followed up as soon as possible or go to the Emergency Department if any problems should occur.  Please show the  CHEMOTHERAPY ALERT CARD or IMMUNOTHERAPY ALERT CARD at check-in to the Emergency Department and triage nurse.  Should you have questions after your visit or need to cancel or reschedule your appointment, please contact [facility]  Dept: [phone]  and follow the prompts.  Office hours are 8:00 a.m. to 4:30 p.m. Monday - Friday. Please note that voicemails left after 4:00 p.m. may not be returned until the following business day.  We are closed weekends and major holidays. You have access to a nurse at all times for urgent questions. Please call the main number to the clinic Dept: [phone] and follow the prompts.   For any non-urgent questions, you may also contact your provider using MyChart. We now offer e-Visits for anyone 18 and older to request care online for non-urgent symptoms. For details visit [email or web address].   Also download the MyChart app! Go to the app store, search "MyChart", open the app, select , and log in with your MyChart username and password.  Paclitaxel Nanoparticle Albumin-Bound Injection What is this medication? NANOPARTICLE ALBUMIN-BOUND PACLITAXEL (Na no PAHR ti kuhl al BYOO muhn-bound PAK li TAX el) treats some types of cancer. It works by slowing down the growth of cancer cells. This medicine may be used for other purposes; ask your health care provider or pharmacist if you have questions. COMMON BRAND NAME(S): Abraxane What should I tell my care team before I take this medication? They need to know if you have any of these conditions: Liver disease Low white blood cell levels An unusual or allergic reaction to paclitaxel, albumin, other medications, foods,   dyes, or preservatives If you or your partner are pregnant or trying to get pregnant Breast-feeding How should I use this medication? This medication is injected into a vein. It is given by your care team in a hospital or clinic setting. Talk to your  care team about the use of this medication in children. Special care may be needed. Overdosage: If you think you have taken too much of this medicine contact a poison control center or emergency room at once. NOTE: This medicine is only for you. Do not share this medicine with others. What if I miss a dose? Keep appointments for follow-up doses. It is important not to miss your dose. Call your care team if you are unable to keep an appointment. What may interact with this medication? Other medications may affect the way this medication works. Talk with your care team about all of the medications you take. They may suggest changes to your treatment plan to lower the risk of side effects and to make sure your medications work as intended. This list may not describe all possible interactions. Give your health care provider a list of all the medicines, herbs, non-prescription drugs, or dietary supplements you use. Also tell them if you smoke, drink alcohol, or use illegal drugs. Some items may interact with your medicine. What should I watch for while using this medication? Your condition will be monitored carefully while you are receiving this medication. You may need blood work while taking this medication. This medication may make you feel generally unwell. This is not uncommon as chemotherapy can affect healthy cells as well as cancer cells. Report any side effects. Continue your course of treatment even though you feel ill unless your care team tells you to stop. This medication can cause serious allergic reactions. To reduce the risk, your care team may give you other medications to take before receiving this one. Be sure to follow the directions from your care team. This medication may increase your risk of getting an infection. Call your care team for advice if you get a fever, chills, sore throat, or other symptoms of a cold or flu. Do not treat yourself. Try to avoid being around people who are  sick. This medication may increase your risk to bruise or bleed. Call your care team if you notice any unusual bleeding. Be careful brushing or flossing your teeth or using a toothpick because you may get an infection or bleed more easily. If you have any dental work done, tell your dentist you are receiving this medication. Talk to your care team if you or your partner may be pregnant. Serious birth defects can occur if you take this medication during pregnancy and for 6 months after the last dose. You will need a negative pregnancy test before starting this medication. Contraception is recommended while taking this medication and for 6 months after the last dose. Your care team can help you find the option that works for you. If your partner can get pregnant, use a condom during sex while taking this medication and for 3 months after the last dose. Do not breastfeed while taking this medication and for 2 weeks after the last dose. This medication may cause infertility. Talk to your care team if you are concerned about your fertility. What side effects may I notice from receiving this medication? Side effects that you should report to your care team as soon as possible: Allergic reactions--skin rash, itching, hives, swelling of the face, lips, tongue, or   throat Dry cough, shortness of breath or trouble breathing Infection--fever, chills, cough, sore throat, wounds that don't heal, pain or trouble when passing urine, general feeling of discomfort or being unwell Low red blood cell level--unusual weakness or fatigue, dizziness, headache, trouble breathing Pain, tingling, or numbness in the hands or feet Stomach pain, unusual weakness or fatigue, nausea, vomiting, diarrhea, or fever that lasts longer than expected Unusual bruising or bleeding Side effects that usually do not require medical attention (report to your care team if they continue or are bothersome): Diarrhea Fatigue Hair loss Loss of  appetite Nausea Vomiting This list may not describe all possible side effects. Call your doctor for medical advice about side effects. You may report side effects to FDA at 1-800-FDA-1088. Where should I keep my medication? This medication is given in a hospital or clinic. It will not be stored at home. NOTE: This sheet is a summary. It may not cover all possible information. If you have questions about this medicine, talk to your doctor, pharmacist, or health care provider.  2023 Elsevier/Gold Standard (2007-07-18 00:00:00)  Gemcitabine Injection What is this medication? GEMCITABINE (jem SYE ta been) treats some types of cancer. It works by slowing down the growth of cancer cells. This medicine may be used for other purposes; ask your health care provider or pharmacist if you have questions. COMMON BRAND NAME(S): Gemzar, Infugem What should I tell my care team before I take this medication? They need to know if you have any of these conditions: Blood disorders Infection Kidney disease Liver disease Lung or breathing disease, such as asthma or COPD Recent or ongoing radiation therapy An unusual or allergic reaction to gemcitabine, other medications, foods, dyes, or preservatives If you or your partner are pregnant or trying to get pregnant Breast-feeding How should I use this medication? This medication is injected into a vein. It is given by your care team in a hospital or clinic setting. Talk to your care team about the use of this medication in children. Special care may be needed. Overdosage: If you think you have taken too much of this medicine contact a poison control center or emergency room at once. NOTE: This medicine is only for you. Do not share this medicine with others. What if I miss a dose? Keep appointments for follow-up doses. It is important not to miss your dose. Call your care team if you are unable to keep an appointment. What may interact with this  medication? Interactions have not been studied. This list may not describe all possible interactions. Give your health care provider a list of all the medicines, herbs, non-prescription drugs, or dietary supplements you use. Also tell them if you smoke, drink alcohol, or use illegal drugs. Some items may interact with your medicine. What should I watch for while using this medication? Your condition will be monitored carefully while you are receiving this medication. This medication may make you feel generally unwell. This is not uncommon, as chemotherapy can affect healthy cells as well as cancer cells. Report any side effects. Continue your course of treatment even though you feel ill unless your care team tells you to stop. In some cases, you may be given additional medications to help with side effects. Follow all directions for their use. This medication may increase your risk of getting an infection. Call your care team for advice if you get a fever, chills, sore throat, or other symptoms of a cold or flu. Do not treat yourself. Try to   avoid being around people who are sick. This medication may increase your risk to bruise or bleed. Call your care team if you notice any unusual bleeding. Be careful brushing or flossing your teeth or using a toothpick because you may get an infection or bleed more easily. If you have any dental work done, tell your dentist you are receiving this medication. Avoid taking medications that contain aspirin, acetaminophen, ibuprofen, naproxen, or ketoprofen unless instructed by your care team. These medications may hide a fever. Talk to your care team if you or your partner wish to become pregnant or think you might be pregnant. This medication can cause serious birth defects if taken during pregnancy and for 6 months after the last dose. A negative pregnancy test is required before starting this medication. A reliable form of contraception is recommended while taking  this medication and for 6 months after the last dose. Talk to your care team about effective forms of contraception. Do not father a child while taking this medication and for 3 months after the last dose. Use a condom while having sex during this time period. Do not breastfeed while taking this medication and for at least 1 week after the last dose. This medication may cause infertility. Talk to your care team if you are concerned about your fertility. What side effects may I notice from receiving this medication? Side effects that you should report to your care team as soon as possible: Allergic reactions--skin rash, itching, hives, swelling of the face, lips, tongue, or throat Capillary leak syndrome--stomach or muscle pain, unusual weakness or fatigue, feeling faint or lightheaded, decrease in the amount of urine, swelling of the ankles, hands, or feet, trouble breathing Infection--fever, chills, cough, sore throat, wounds that don't heal, pain or trouble when passing urine, general feeling of discomfort or being unwell Liver injury--right upper belly pain, loss of appetite, nausea, light-colored stool, dark yellow or brown urine, yellowing skin or eyes, unusual weakness or fatigue Low red blood cell level--unusual weakness or fatigue, dizziness, headache, trouble breathing Lung injury--shortness of breath or trouble breathing, cough, spitting up blood, chest pain, fever Stomach pain, bloody diarrhea, pale skin, unusual weakness or fatigue, decrease in the amount of urine, which may be signs of hemolytic uremic syndrome Sudden and severe headache, confusion, change in vision, seizures, which may be signs of posterior reversible encephalopathy syndrome (PRES) Unusual bruising or bleeding Side effects that usually do not require medical attention (report to your care team if they continue or are bothersome): Diarrhea Drowsiness Hair loss Nausea Pain, redness, or swelling with sores inside the  mouth or throat Vomiting This list may not describe all possible side effects. Call your doctor for medical advice about side effects. You may report side effects to FDA at 1-800-FDA-1088. Where should I keep my medication? This medication is given in a hospital or clinic. It will not be stored at home. NOTE: This sheet is a summary. It may not cover all possible information. If you have questions about this medicine, talk to your doctor, pharmacist, or health care provider.  2023 Elsevier/Gold Standard (2021-10-02 00:00:00)  

## 2022-09-12 NOTE — Progress Notes (Signed)
[place] OFFICE PROGRESS NOTE   Diagnosis: Pancreas cancer  INTERVAL HISTORY:   Ms. Mazey returns as scheduled.  She completed another cycle of gemcitabine 08/29/2022.  She denies nausea/vomiting.  No mouth sores.  No diarrhea.  Persistent neuropathy symptoms, feet more so than hands.  Symptoms "flare" intermittently.  Objective:  Vital signs in last 24 hours:  Blood pressure 126/78, pulse 66, temperature 98.1 F (36.7 C), temperature source Oral, resp. rate 18, height 5\' 7"  (1.702 m), weight 198 lb 9.6 oz (90.1 kg), last menstrual period 09/17/2012, SpO2 100 %.    HEENT: No thrush or ulcers. Resp: Lungs clear bilaterally. Cardio: Regular rate and rhythm. GI: No hepatosplenomegaly. Vascular: No leg edema. Port-A-Cath without erythema.  Lab Results:  Lab Results  Component Value Date   WBC 5.0 09/12/2022   HGB 9.4 (L) 09/12/2022   HCT 30.0 (L) 09/12/2022   MCV 96.8 09/12/2022   PLT 219 09/12/2022   NEUTROABS 3.4 09/12/2022    Imaging:  CT CHEST ABDOMEN PELVIS W CONTRAST  Result Date: 09/11/2022 CLINICAL DATA:  Pancreatic cancer restaging with elevated cancer marker. * Tracking Code: BO * EXAM: CT CHEST, ABDOMEN, AND PELVIS WITH CONTRAST TECHNIQUE: Multidetector CT imaging of the chest, abdomen and pelvis was performed following the standard protocol during bolus administration of intravenous contrast. RADIATION DOSE REDUCTION: This exam was performed according to the departmental dose-optimization program which includes automated exposure control, adjustment of the mA and/or kV according to patient size and/or use of iterative reconstruction technique. CONTRAST:  153mL OMNIPAQUE IOHEXOL 300 MG/ML  SOLN COMPARISON:  06/04/2022. FINDINGS: CT CHEST FINDINGS Cardiovascular: Left IJ Port-A-Cath terminates in the high SVC. Heart size is at the upper limits of normal. No pericardial effusion. Mediastinum/Nodes: No pathologically enlarged mediastinal, hilar or axillary  lymph nodes. Small prepericardiac lymph nodes, as before. Esophagus is grossly unremarkable. Lungs/Pleura: Hematogenously distributed cavitary and solid nodules have enlarged in the interval. Index cavitary nodule in the posterior right lower lobe is thicker walled and overall increased in size, 1.5 x 1.7 cm (4/75), previously 1.3 x 1.4 cm on 06/04/2022. No pleural fluid. Airway is unremarkable. Musculoskeletal: Degenerative changes in the spine. No worrisome lytic or sclerotic lesions. CT ABDOMEN PELVIS FINDINGS Hepatobiliary: Subtle poorly defined hypoattenuating lesions in the liver, less well seen than on 06/04/2022. Index inferior right hepatic lobe lesion measures roughly 2.5 x 2.6 cm, similar to 06/04/2022. There may be a new 1.9 cm hypoattenuating lesion in the dome of the right hepatic lobe (2/36). Cholecystectomy. No biliary ductal dilatation. Pancreas: Pancreaticoduodenectomy. A stent is seen in the body of the pancreas. No ductal dilatation or additional gland atrophy. Spleen: Negative. Adrenals/Urinary Tract: Right adrenal gland nodule measures 2.3 cm and was shown to be fluid in density (12 Hounsfield units) on PET 03/03/2019. No follow-up necessary. Scarring in the right kidney. Kidneys are otherwise unremarkable. Ureters are decompressed. Bladder is grossly unremarkable. Stomach/Bowel: Pancreaticoduodenectomy. Stomach, small bowel, appendix and colon are otherwise unremarkable. Vascular/Lymphatic: Atherosclerotic calcification of the aorta. No pathologically enlarged lymph nodes. Reproductive: Enlarged fibroid uterus with the largest fibroid extending from the fundus, measuring 9.2 cm. No adnexal mass. Other: Small pelvic free fluid. Mesenteries and peritoneum are unremarkable. Musculoskeletal: Degenerative changes in the spine. No worrisome lytic or sclerotic lesions. Minimal retrolisthesis of L5 on S1. IMPRESSION: 1. Enlarging pulmonary metastases. Hepatic metastatic disease with a suspected new  lesion in the dome of the right hepatic lobe. 2. Right adrenal adenoma. 3. Enlarged fibroid uterus. 4. Small pelvic  free fluid. Electronically Signed   By: Lorin Picket M.D.   On: 09/11/2022 11:27    Medications: I have reviewed the patient's current medications.  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 Tumor invades the duodenal wall and vascular groove, resection margins negative, 4/34 lymph nodes positive MSI-stable, tumor showed instability in 2 loci as did adjacent normal tissue Foundation 1-K-ras G12 V, microsatellite status and tumor mutation burden cannot be determined EUS FNA biopsy of pancreas mass on 07/03/2018-well-differentiated neuroendocrine tumor CTs 01/29/2019-ill-defined pancreas head mass, 5 pulmonary nodules-1 with a small amount of central cavitation, tumor abuts the left margin of the portal vein indistinct density surrounding, hepatic artery, complex cystic lesion of the right kidney, right adrenal mass-characterized as an adenoma on a [facility] MRI 12/21/2018 [facility] 03/03/2019-no focal pancreas activity, no tracer accumulation within the suspicious pulmonary nodules, left uterine mass with tracer accumulation felt to represent a leiomyoma Elevated preoperative CA 19-9--CA 19-9 186 on 01/18/2019 CT chest 04/16/2019-multiple bilateral pulmonary nodules, some with increased cavitation, stable in size Cycle 1 FOLFIRINOX 04/27/2019 Cycle 2 FOLFIRINOX 05/11/2019 Cycle 3 FOLFIRINOX 05/23/2019 Cycle 4 FOLFIRINOX 06/08/2019 Cycle 5 FOLFIRINOX 06/22/2019 CT chest 07/02/2019-stable size of bilateral pulmonary nodules.  Dominant cavitary lesions in both lungs show increased cavitation with thinner walls.  Stable 2.1 cm right adrenal nodule. Cycle 6 FOLFIRINOX 07/06/2019 Cycle 7 FOLFIRINOX 07/21/2019 Cycle 8 FOLFIRINOX 08/03/2019, oxaliplatin deleted secondary to neuropathy CT chest 08/24/2019-decreased size of several lung nodules  with resolution of a left upper lobe nodule, no new nodules Radiation to the pancreas surgical area with concurrent Xeloda 09/13/2019-10/20/2019  CTs 11/29/2019-multiple small pulmonary nodules scattered throughout the lungs bilaterally, appear increased in number and size. No definite evidence of metastatic disease in the abdomen or pelvis. Markedly enlarged and heterogeneous appearing uterus, likely to represent multifocal fibroids. 1 of these lesions appears to be an exophytic subserosal fibroid in the posterior lateral aspect of the uterine body on the left side although this comes in close proximity to the left adnexa such that a primary ovarian lesion is difficult to completely exclude. CTs 02/07/2020-slight enlargement of bilateral lung nodules, some are cavitary, no evidence of metastatic disease in the abdomen or pelvis, stable right adrenal nodule, uterine fibroids CTs 04/26/2020-mild enlargement of pulmonary nodules, slight increase in trace pelvic fluid, new soft tissue thickening inferior to the cecal tip suspicious for peritoneal metastasis CT 05/26/2020-improved appearance of soft tissue at the inferior tip of the cecum, mildly thickened short appendix-findings suggestive of resolving appendicitis, stable small bibasilar pulmonary nodules, fibroids Plan biopsy of right cecal tip soft tissue canceled secondary to radiologic improvement CT chest 08/02/2020-enlargement and progressive cavitation of multiple bilateral lung nodules.  Some new nodules are present. CTs 10/24/2020- increase in size of pulmonary nodules, no new nodules, no evidence of metastatic disease in the abdomen, stable right adrenal nodule CT 01/09/2021-slight interval enlargement of pulmonary nodules, stable right adrenal nodule Navigation bronchoscopy 01/30/2021-left lower lobe cavitary nodule FNA-adenocarcinoma, brushing-adenocarcinoma.  Left lower lobe lavage-adenocarcinoma.  Right upper lobe brushing and FNA biopsy of cavitary  nodule-adenocarcinoma-immunohistochemical profile consistent with pancreas adenocarcinoma Cycle 1 gemcitabine/Abraxane 03/28/2021 Cycle 2 gemcitabine/Abraxane 04/11/2021 Cycle 3 gemcitabine/Abraxane 04/25/2021 Cycle 4 gemcitabine/Abraxane 05/09/2021 Cycle 5 gemcitabine/Abraxane 05/23/2021 CT chest 06/05/2021-interval cavitation of multiple pulmonary nodules, some nodules have decreased in size, no new or enlarging nodules Cycle 6 gemcitabine/Abraxane 06/06/2021 Cycle 7 gemcitabine/Abraxane 06/21/2021 Cycle 8 gemcitabine/Abraxane 07/05/2021 Cycle 9 Gemcitabine/Abraxane 07/19/2021 Cycle 10 gemcitabine 08/01/2021-Abraxane held secondary to neuropathy CT chest 08/13/2021-mild decrease in  size and wall thickness of multiple cavitary nodules, no new or progressive disease in the chest, indeterminate low-attenuation right liver lesions Cycle 11 gemcitabine 08/16/2021-Abraxane held secondary to neuropathy Cycle 12 gemcitabine 08/30/2021-Abraxane held secondary to neuropathy Cycle 13 gemcitabine 09/13/2021-Abraxane held secondary to neuropathy Cycle 14 gemcitabine 09/27/2021-Abraxane held secondary to neuropathy Cycle 15 gemcitabine 10/11/2021-Abraxane held secondary to neuropathy CTs 10/22/2021-no change in multiple cavitary lung nodules, no evidence of disease progression, ill-defined hypodense lesion in the posterior right liver suspicious for metastatic disease Cycle 16 gemcitabine 10/25/2021 Cycle 17 gemcitabine 11/08/2021 Cycle 18 Gemcitabine 11/22/2021 Cycle 19 gemcitabine 12/06/2021 Cycle 20 Gemcitabine 12/20/2021 CT 12/31/2021-mild increase in size of bilateral pulmonary metastases, stable subtle continuation right liver lesions Cycle 20 gemcitabine/Abraxane 01/03/2022 Cycle 21 gemcitabine/Abraxane 01/17/2022 Cycle 22 gemcitabine/Abraxane 01/31/2022 Cycle 23 gemcitabine/Abraxane 02/14/2022 Cycle 24 gemcitabine/Abraxane 02/28/2022 CTs 03/11/2022-widespread metastatic disease to the lungs again noted with slight  involution of several of the pulmonary nodules, no definite new nodules noted; interval cavitation of solid lesion in the posterior aspect right lobe of the liver, no new liver lesions noted. Cycle 25 Gemcitabine/Abraxane 03/14/2022 Cycle 26 gemcitabine/Abraxane 03/28/2022 Cycle 27 gemcitabine/Abraxane 04/25/2022 Cycle 28 gemcitabine/Abraxane 05/09/2022 Cycle 29 Gemcitabine 05/23/2022, Abraxane held due to neuropathy CTs 06/04/2022-stable multifocal cavitary pulmonary nodules.  No definite new nodules.  No new nodules greater than a centimeter.  Decreased size of the right posterior hepatic lobe lesion.  Generalized heterogeneity and new focal areas of hypodensity in the liver. Cycle 30 Gemcitabine 06/06/2022, Abraxane held due to neuropathy Cycle 31 gemcitabine 06/20/2022, Abraxane held due to neuropathy Cycle 32 gemcitabine 07/04/2022, Abraxane held due to neuropathy Cycle 33 gemcitabine  07/18/2022 ,Abraxane held due to neuropathy Cycle 34 gemcitabine 08/15/2022, Abraxane held due to neuropathy Cycle 35 gemcitabine 08/29/2022, Abraxane held due to neuropathy CTs 09/10/2022-enlarging pulmonary metastases.  Subtle poorly defined hypoattenuating lesions in the liver, less well-seen than on 06/04/2022.  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  She has been on active treatment with gemcitabine alone since  05/23/2022.  Recent restaging CTs show evidence of progression.  Results/images reviewed with her at today's visit.  We reviewed options to include continuation of Gemcitabine/resume Abraxane, NALIRIFOX, refer for clinical trial availability.  She would like to continue Gemcitabine and resume Abraxane.  She understands the risk of worsening neuropathy and agrees to proceed.  [facility].  Plan to proceed with gemcitabine/Abraxane today.  CBC and chemistry panel reviewed.  Labs adequate to proceed as above.  She will return for lab, follow-up, Gemcitabine/Abraxane in 2 weeks.  Plan for repeat CT imaging in 2 months.  Patient seen with Dr. Benay Spice.    Ned Card ANP/GNP-BC   09/12/2022  11:06 AM  This was a shared visit with Ned Card.  We reviewed the CT findings and images with Ms. Anelli.  We discussed treatment options including resumption of Abraxane, NALIRIFOX, and referral for clinical trial.  We will forward the CT images to the [facility] to see whether she may  be eligible for resection of a lung nodule and T cell directed therapy.  She would like to continue gemcitabine and resume Abraxane.  She understands the chance of worsening neuropathy.  I was present for greater than 50% of today's visit.  I performed medical decision making.  Julieanne Manson, MD

## 2022-09-12 NOTE — Telephone Encounter (Signed)
-----   Message from Owens Shark, NP sent at 09/12/2022  4:10 PM EDT ----- Please let her know the magnesium level is mildly low.  She should continue magnesium as she is currently taking.  We could schedule her for IV magnesium tomorrow, 2 g, if she agrees.

## 2022-09-12 NOTE — Telephone Encounter (Signed)
I contacted the patient to inform her that her magnesium level is 1.6. I advised her to continue taking magnesium at home. The patient declined to come in for IV magnesium treatment, as she is not experiencing any symptoms of low magnesium. She plans to repeat the lab test in two weeks.

## 2022-09-12 NOTE — Progress Notes (Signed)
PATIENT NAVIGATOR PROGRESS NOTE  Name: Alyssa Ross Date: 09/12/2022 MRN: [id number]  DOB: Apr 23, 1966   Reason for visit:  Coordination of care  Comments:  Most recent images to be sent to [facility] Following message sent to radiology  Can you please send CD to [facility] of [name] most recent images? The CD can be sent to the address below, using our FED EX charge # [id number] or you can upload it electronically by following the directions in the link  [email or web address]        Evert Kohl  Referral Nurse  [facility]  [address]  [place], [place]  [address]  fax:  [phone]       Time spent counseling/coordinating care: 45-60 minutes

## 2022-09-12 NOTE — Progress Notes (Signed)
Patient seen by Ned Card NP today  Vitals are within treatment parameters.  Labs reviewed by Ned Card NP and are within treatment parameters.  Per physician team, patient is ready for treatment. Please note that modifications are being made to the treatment plan including resuming abraxane and gemzar

## 2022-09-13 LAB — CANCER ANTIGEN 19-9: CA 19-9: 147 U/mL — ABNORMAL HIGH (ref 0–35)

## 2022-09-19 ENCOUNTER — Encounter: Payer: Self-pay | Admitting: Internal Medicine

## 2022-09-22 ENCOUNTER — Other Ambulatory Visit: Payer: Self-pay | Admitting: Oncology

## 2022-09-24 ENCOUNTER — Encounter: Payer: Self-pay | Admitting: *Deleted

## 2022-09-24 NOTE — Progress Notes (Signed)
Ms. Mccommons requested her CT images of 09/11/22 be mailed to [facility]: Att: Dr. Lewie Chamber [facility] [address], [facility] Room [phone] [place], [place] [address] and use FedEx account [id number] This request was forwarded to [facility] in radiology.

## 2022-09-26 ENCOUNTER — Inpatient Hospital Stay: Payer: BC Managed Care – PPO

## 2022-09-26 ENCOUNTER — Encounter: Payer: Self-pay | Admitting: Dermatology

## 2022-09-26 ENCOUNTER — Other Ambulatory Visit: Payer: Self-pay

## 2022-09-26 ENCOUNTER — Inpatient Hospital Stay (HOSPITAL_BASED_OUTPATIENT_CLINIC_OR_DEPARTMENT_OTHER): Payer: BC Managed Care – PPO | Admitting: Nurse Practitioner

## 2022-09-26 ENCOUNTER — Encounter: Payer: Self-pay | Admitting: Nurse Practitioner

## 2022-09-26 ENCOUNTER — Ambulatory Visit: Payer: BC Managed Care – PPO | Admitting: Dermatology

## 2022-09-26 VITALS — BP 107/71 | HR 59

## 2022-09-26 VITALS — BP 123/85 | HR 61

## 2022-09-26 VITALS — BP 117/75 | HR 70 | Temp 98.1°F | Resp 18 | Ht 67.0 in | Wt 197.8 lb

## 2022-09-26 DIAGNOSIS — C7802 Secondary malignant neoplasm of left lung: Secondary | ICD-10-CM | POA: Diagnosis not present

## 2022-09-26 DIAGNOSIS — C7801 Secondary malignant neoplasm of right lung: Secondary | ICD-10-CM | POA: Diagnosis not present

## 2022-09-26 DIAGNOSIS — C250 Malignant neoplasm of head of pancreas: Secondary | ICD-10-CM | POA: Diagnosis not present

## 2022-09-26 DIAGNOSIS — I10 Essential (primary) hypertension: Secondary | ICD-10-CM | POA: Diagnosis not present

## 2022-09-26 DIAGNOSIS — L814 Other melanin hyperpigmentation: Secondary | ICD-10-CM

## 2022-09-26 DIAGNOSIS — L700 Acne vulgaris: Secondary | ICD-10-CM | POA: Diagnosis not present

## 2022-09-26 DIAGNOSIS — Z79899 Other long term (current) drug therapy: Secondary | ICD-10-CM | POA: Diagnosis not present

## 2022-09-26 DIAGNOSIS — E1140 Type 2 diabetes mellitus with diabetic neuropathy, unspecified: Secondary | ICD-10-CM | POA: Diagnosis not present

## 2022-09-26 DIAGNOSIS — Z5111 Encounter for antineoplastic chemotherapy: Secondary | ICD-10-CM | POA: Diagnosis not present

## 2022-09-26 DIAGNOSIS — Z905 Acquired absence of kidney: Secondary | ICD-10-CM | POA: Diagnosis not present

## 2022-09-26 LAB — CMP (CANCER CENTER ONLY)
ALT: 24 U/L (ref 0–44)
AST: 20 U/L (ref 15–41)
Albumin: 3.7 g/dL (ref 3.5–5.0)
Alkaline Phosphatase: 250 U/L — ABNORMAL HIGH (ref 38–126)
Anion gap: 5 (ref 5–15)
BUN: 9 mg/dL (ref 6–20)
CO2: 23 mmol/L (ref 22–32)
Calcium: 8.9 mg/dL (ref 8.9–10.3)
Chloride: 108 mmol/L (ref 98–111)
Creatinine: 1.12 mg/dL — ABNORMAL HIGH (ref 0.44–1.00)
GFR, Estimated: 58 mL/min — ABNORMAL LOW (ref >60)
Glucose, Bld: 242 mg/dL — ABNORMAL HIGH (ref 70–99)
Potassium: 4.1 mmol/L (ref 3.5–5.1)
Sodium: 136 mmol/L (ref 135–145)
Total Bilirubin: 0.3 mg/dL (ref 0.3–1.2)
Total Protein: 7.4 g/dL (ref 6.5–8.1)

## 2022-09-26 LAB — CBC WITH DIFFERENTIAL (CANCER CENTER ONLY)
Abs Immature Granulocytes: 0.04 10*3/uL (ref 0.00–0.07)
Basophils Absolute: 0 10*3/uL (ref 0.0–0.1)
Basophils Relative: 1 %
Eosinophils Absolute: 0.1 10*3/uL (ref 0.0–0.5)
Eosinophils Relative: 3 %
HCT: 30 % — ABNORMAL LOW (ref 36.0–46.0)
Hemoglobin: 9.6 g/dL — ABNORMAL LOW (ref 12.0–15.0)
Immature Granulocytes: 1 %
Lymphocytes Relative: 19 %
Lymphs Abs: 0.9 10*3/uL (ref 0.7–4.0)
MCH: 30.8 pg (ref 26.0–34.0)
MCHC: 32 g/dL (ref 30.0–36.0)
MCV: 96.2 fL (ref 80.0–100.0)
Monocytes Absolute: 0.8 10*3/uL (ref 0.1–1.0)
Monocytes Relative: 17 %
Neutro Abs: 2.9 10*3/uL (ref 1.7–7.7)
Neutrophils Relative %: 59 %
Platelet Count: 444 10*3/uL — ABNORMAL HIGH (ref 150–400)
RBC: 3.12 MIL/uL — ABNORMAL LOW (ref 3.87–5.11)
RDW: 14.1 % (ref 11.5–15.5)
WBC Count: 4.8 10*3/uL (ref 4.0–10.5)
nRBC: 0 % (ref 0.0–0.2)

## 2022-09-26 LAB — MAGNESIUM: Magnesium: 1.6 mg/dL — ABNORMAL LOW (ref 1.7–2.4)

## 2022-09-26 MED ORDER — SODIUM CHLORIDE 0.9 % IV SOLN: Freq: Once | INTRAVENOUS | Status: AC

## 2022-09-26 MED ORDER — MAGNESIUM SULFATE 2 GM/50ML IV SOLN
2.0000 g | Freq: Once | INTRAVENOUS | Status: AC
  Administered 2022-09-26: 2 g via INTRAVENOUS
  Filled 2022-09-26: qty 50

## 2022-09-26 MED ORDER — HEPARIN SOD (PORK) LOCK FLUSH 100 UNIT/ML IV SOLN
500.0000 [IU] | Freq: Once | INTRAVENOUS | Status: AC | PRN
  Administered 2022-09-26: 500 [IU]

## 2022-09-26 MED ORDER — PROCHLORPERAZINE MALEATE 10 MG PO TABS
10.0000 mg | ORAL_TABLET | Freq: Once | ORAL | Status: AC
  Administered 2022-09-26: 10 mg via ORAL
  Filled 2022-09-26: qty 1

## 2022-09-26 MED ORDER — SODIUM CHLORIDE 0.9% FLUSH
10.0000 mL | INTRAVENOUS | Status: DC | PRN
  Administered 2022-09-26: 10 mL

## 2022-09-26 MED ORDER — SODIUM CHLORIDE 0.9 % IV SOLN
2000.0000 mg | Freq: Once | INTRAVENOUS | Status: AC
  Administered 2022-09-26: 2000 mg via INTRAVENOUS
  Filled 2022-09-26: qty 52.6

## 2022-09-26 MED ORDER — PACLITAXEL PROTEIN-BOUND CHEMO INJECTION 100 MG
100.0000 mg/m2 | Freq: Once | INTRAVENOUS | Status: AC
  Administered 2022-09-26: 200 mg via INTRAVENOUS
  Filled 2022-09-26: qty 40

## 2022-09-26 NOTE — Patient Instructions (Addendum)
Continue Doxycycline 100 mg once daily with food. Continue Metronidazole 0.75% cream. Stop Triamcinolone cream.   Daily Regimen Template:  Morning: 1. Wash your face with a gentle cleanser (Cetaphil, CeraVe, Neutrogena) 2. Apply  Metronidazole cream 3. Follow with a moisturizer and sunscreen suitable for acne-prone skin.[place]nt[place].  Evening: 1. Wash your face with a gentle cleanser (Cetaphil, CeraVe, Neutrogena) 2. Apply Apply [place] with hyaluronic acid to face 3. Apply a pea-sized amount of  Effaclar/Adapalene 0.1%   to entire face.   4. Apply a non-comedogenic moisturizer to keep the skin hydrated overnight (Neutrogena, CeraVe, Cetaphil)  Note: Always follow your dermatologist's recommendations and treatment plan for best results. Consistency is key to managing acne effectively. If you experience any severe side effects or worsening of symptoms, consult your healthcare provider promptly.   Doxycycline should be taken with food to prevent nausea. Do not lay down for 30 minutes after taking. Be cautious with sun exposure and use good sun protection while on this medication. Pregnant women should not take this medication.    Topical retinoid medications like tretinoin/Retin-A, adapalene/Differin, tazarotene/Fabior, and Epiduo/Epiduo Forte can cause dryness and irritation when first started. Only apply a pea-sized amount to the entire affected area. Avoid applying it around the eyes, edges of mouth and creases at the nose. If you experience irritation, use a good moisturizer first and/or apply the medicine less often. If you are doing well with the medicine, you can increase how often you use it until you are applying every night. Be careful with sun protection while using this medication as it can make you sensitive to the sun. This medicine should not be used by pregnant women.      Recommended Sunscreen            Due to recent changes in healthcare laws, you may see results of  your pathology and/or laboratory studies on MyChart before the doctors have had a chance to review them. We understand that in some cases there may be results that are confusing or concerning to you. Please understand that not all results are received at the same time and often the doctors may need to interpret multiple results in order to provide you with the best plan of care or course of treatment. Therefore, we ask that you please give [place] 2 business days to thoroughly review all your results before contacting the office for clarification. Should we see a critical lab result, you will be contacted sooner.   If You Need Anything After Your Visit  If you have any questions or concerns for your doctor, please call our main line at [phone] If no one answers, please leave a voicemail as directed and we will return your call as soon as possible. Messages left after 4 pm will be answered the following business day.   You may also send [place] a message via MyChart. We typically respond to MyChart messages within 1-2 business days.  For prescription refills, please ask your pharmacy to contact our office. Our fax number is [phone].  If you have an urgent issue when the clinic is closed that cannot wait until the next business day, you can page your doctor at the number below.    Please note that while we do our best to be available for urgent issues outside of office hours, we are not available 24/7.   If you have an urgent issue and are unable to reach [place], you may choose to seek medical care at your doctor's  office, retail clinic, urgent care center, or emergency room.  If you have a medical emergency, please immediately call 911 or go to the emergency department. In the event of inclement weather, please call our main line at [phone] for an update on the status of any delays or closures.  Dermatology Medication Tips: Please keep the boxes that topical medications come in in order to help  keep track of the instructions about where and how to use these. Pharmacies typically print the medication instructions only on the boxes and not directly on the medication tubes.   If your medication is too expensive, please contact our office at [phone] or send [place] a message through MyChart.   We are unable to tell what your co-pay for medications will be in advance as this is different depending on your insurance coverage. However, we may be able to find a substitute medication at lower cost or fill out paperwork to get insurance to cover a needed medication.   If a prior authorization is required to get your medication covered by your insurance company, please allow [place] 1-2 business days to complete this process.  Drug prices often vary depending on where the prescription is filled and some pharmacies may offer cheaper prices.  The website www.goodrx.com contains coupons for medications through different pharmacies. The prices here do not account for what the cost may be with help from insurance (it may be cheaper with your insurance), but the website can give you the price if you did not use any insurance.  - You can print the associated coupon and take it with your prescription to the pharmacy.  - You may also stop by our office during regular business hours and pick up a GoodRx coupon card.  - If you need your prescription sent electronically to a different pharmacy, notify our office through [facility] or by phone at [phone]

## 2022-09-26 NOTE — Progress Notes (Signed)
Follow-Up Visit   Subjective  Alyssa Ross is a 57 y.o. female who presents for the following: Face and neck breaking out. Sensitive skin. Thinks secondary to chemo. Has pancreatic cancer. On chemo 1 1/2 years, every 2 weeks. Itches at times, painful to the touch at times. Breaks out within 2-3 days after chemo treatments.  Has seen a dermatologist previously: Taking Doxycycline 100 mg once daily for 5-6 months. Using Metronidazole 0.75% cream twice daily. Using Triamcinolone cream as needed for symptoms. Has tretinoin 0.05% cream but has not started due to concern of irritation. Using CeraVe products. Was using tumeric soap and natural oils to moisturize prior to CeraVe  The following portions of the chart were reviewed this encounter and updated as appropriate: medications, allergies, medical history  Review of Systems:  No other skin or systemic complaints except as noted in HPI or Assessment and Plan.  Objective  Well appearing patient in no apparent distress; mood and affect are within normal limits.  A focused examination was performed of the following areas: Face and neck  Relevant exam findings are noted in the Assessment and Plan.          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 Condition is bothersome/symptomatic for patient. Currently flared.   Treatment Plan: Daily Regimen:  Morning: 1. Wash your face with a gentle cleanser (Cetaphil, CeraVe, Neutrogena) 2. Apply  Metronidazole cream 3. Follow with a moisturizer and sunscreen suitable for acne-prone skin.Isntree.  Evening: 1. Wash your face with a gentle cleanser (Cetaphil, CeraVe, Neutrogena) 2. Apply Apply Vichy with hyaluronic acid to face 3. Apply a pea-sized amount of  Effaclar/Adapalene 0.1%   to entire face.   4. Apply  a non-comedogenic moisturizer to keep the skin hydrated overnight (Neutrogena, CeraVe, Cetaphil)  Doxycycline should be taken with food to prevent nausea. Do not lay down for 30 minutes after taking. Be cautious with sun exposure and use good sun protection while on this medication. Pregnant women should not take this medication.    Topical retinoid medications like tretinoin/Retin-A, adapalene/Differin, tazarotene/Fabior, and Epiduo/Epiduo Forte can cause dryness and irritation when first started. Only apply a pea-sized amount to the entire affected area. Avoid applying it around the eyes, edges of mouth and creases at the nose. If you experience irritation, use a good moisturizer first and/or apply the medicine less often. If you are doing well with the medicine, you can increase how often you use it until you are applying every night. Be careful with sun protection while using this medication as it can make you sensitive to the sun. This medicine should not be used by pregnant women.     POST-INFLAMMATORY HYPERPIGMENTATION (PIH) Exam: brown lesions on face and neck.   This is a benign condition that comes from having previous inflammation in the skin and will fade with time over months to sometimes years. Recommend daily sun protection including sunscreen SPF 30+ to sun-exposed areas. - Recommend treating any itchy or red areas on the skin quickly to prevent new areas of PIH. Treating with prescription medicines such as hydroquinone may help fade dark spots faster.    Treatment Plan:  -Wear sunscreen daily -Will add lightening agent    Return in about 2 months (around 11/26/2022) for Acne Follow Up.  I, Lawson Radar, CMA, am acting as scribe  for Langston Reusing, MD.   Documentation: I have reviewed the above documentation for accuracy and completeness, and I agree with the above.  Langston Reusing, MD

## 2022-09-26 NOTE — Patient Instructions (Signed)
[facility]   Discharge Instructions: Thank you for choosing [facility] to provide your oncology and hematology care.   If you have a lab appointment with the Cancer Center, please go directly to the Cancer Center and check in at the registration area.   Wear comfortable clothing and clothing appropriate for easy access to any Portacath or PICC line.   We strive to give you quality time with your provider. You may need to reschedule your appointment if you arrive late (15 or more minutes).  Arriving late affects you and other patients whose appointments are after yours.  Also, if you miss three or more appointments without notifying the office, you may be dismissed from the clinic at the provider's discretion.      For prescription refill requests, have your pharmacy contact our office and allow 72 hours for refills to be completed.    Today you received the following chemotherapy and/or immunotherapy agents Paclitaxel-protein bound (ABRAXANE) & Gemcitabine (GEMZAR).      To help prevent nausea and vomiting after your treatment, we encourage you to take your nausea medication as directed.  BELOW ARE SYMPTOMS THAT SHOULD BE REPORTED IMMEDIATELY: *FEVER GREATER THAN 100.4 F (38 C) OR HIGHER *CHILLS OR SWEATING *NAUSEA AND VOMITING THAT IS NOT CONTROLLED WITH YOUR NAUSEA MEDICATION *UNUSUAL SHORTNESS OF BREATH *UNUSUAL BRUISING OR BLEEDING *URINARY PROBLEMS (pain or burning when urinating, or frequent urination) *BOWEL PROBLEMS (unusual diarrhea, constipation, pain near the anus) TENDERNESS IN MOUTH AND THROAT WITH OR WITHOUT PRESENCE OF ULCERS (sore throat, sores in mouth, or a toothache) UNUSUAL RASH, SWELLING OR PAIN  UNUSUAL VAGINAL DISCHARGE OR ITCHING   Items with * indicate a potential emergency and should be followed up as soon as possible or go to the Emergency Department if any problems should occur.  Please show the  CHEMOTHERAPY ALERT CARD or IMMUNOTHERAPY ALERT CARD at check-in to the Emergency Department and triage nurse.  Should you have questions after your visit or need to cancel or reschedule your appointment, please contact [facility]  Dept: [phone]  and follow the prompts.  Office hours are 8:00 a.m. to 4:30 p.m. Monday - Friday. Please note that voicemails left after 4:00 p.m. may not be returned until the following business day.  We are closed weekends and major holidays. You have access to a nurse at all times for urgent questions. Please call the main number to the clinic Dept: [phone] and follow the prompts.   For any non-urgent questions, you may also contact your provider using MyChart. We now offer e-Visits for anyone 18 and older to request care online for non-urgent symptoms. For details visit [email or web address].   Also download the MyChart app! Go to the app store, search "MyChart", open the app, select [facility], and log in with your MyChart username and password.  Paclitaxel Nanoparticle Albumin-Bound Injection What is this medication? NANOPARTICLE ALBUMIN-BOUND PACLITAXEL (Na no PAHR ti kuhl al BYOO muhn-bound PAK li TAX el) treats some types of cancer. It works by slowing down the growth of cancer cells. This medicine may be used for other purposes; ask your health care provider or pharmacist if you have questions. COMMON BRAND NAME(S): Abraxane What should I tell my care team before I take this medication? They need to know if you have any of these conditions: Liver disease Low white blood cell levels An unusual or allergic reaction to paclitaxel, albumin, other medications, foods,   dyes, or preservatives If you or your partner are pregnant or trying to get pregnant Breast-feeding How should I use this medication? This medication is injected into a vein. It is given by your care team in a hospital or clinic setting. Talk to your  care team about the use of this medication in children. Special care may be needed. Overdosage: If you think you have taken too much of this medicine contact a poison control center or emergency room at once. NOTE: This medicine is only for you. Do not share this medicine with others. What if I miss a dose? Keep appointments for follow-up doses. It is important not to miss your dose. Call your care team if you are unable to keep an appointment. What may interact with this medication? Other medications may affect the way this medication works. Talk with your care team about all of the medications you take. They may suggest changes to your treatment plan to lower the risk of side effects and to make sure your medications work as intended. This list may not describe all possible interactions. Give your health care provider a list of all the medicines, herbs, non-prescription drugs, or dietary supplements you use. Also tell them if you smoke, drink alcohol, or use illegal drugs. Some items may interact with your medicine. What should I watch for while using this medication? Your condition will be monitored carefully while you are receiving this medication. You may need blood work while taking this medication. This medication may make you feel generally unwell. This is not uncommon as chemotherapy can affect healthy cells as well as cancer cells. Report any side effects. Continue your course of treatment even though you feel ill unless your care team tells you to stop. This medication can cause serious allergic reactions. To reduce the risk, your care team may give you other medications to take before receiving this one. Be sure to follow the directions from your care team. This medication may increase your risk of getting an infection. Call your care team for advice if you get a fever, chills, sore throat, or other symptoms of a cold or flu. Do not treat yourself. Try to avoid being around people who are  sick. This medication may increase your risk to bruise or bleed. Call your care team if you notice any unusual bleeding. Be careful brushing or flossing your teeth or using a toothpick because you may get an infection or bleed more easily. If you have any dental work done, tell your dentist you are receiving this medication. Talk to your care team if you or your partner may be pregnant. Serious birth defects can occur if you take this medication during pregnancy and for 6 months after the last dose. You will need a negative pregnancy test before starting this medication. Contraception is recommended while taking this medication and for 6 months after the last dose. Your care team can help you find the option that works for you. If your partner can get pregnant, use a condom during sex while taking this medication and for 3 months after the last dose. Do not breastfeed while taking this medication and for 2 weeks after the last dose. This medication may cause infertility. Talk to your care team if you are concerned about your fertility. What side effects may I notice from receiving this medication? Side effects that you should report to your care team as soon as possible: Allergic reactions--skin rash, itching, hives, swelling of the face, lips, tongue, or   throat Dry cough, shortness of breath or trouble breathing Infection--fever, chills, cough, sore throat, wounds that don't heal, pain or trouble when passing urine, general feeling of discomfort or being unwell Low red blood cell level--unusual weakness or fatigue, dizziness, headache, trouble breathing Pain, tingling, or numbness in the hands or feet Stomach pain, unusual weakness or fatigue, nausea, vomiting, diarrhea, or fever that lasts longer than expected Unusual bruising or bleeding Side effects that usually do not require medical attention (report to your care team if they continue or are bothersome): Diarrhea Fatigue Hair loss Loss of  appetite Nausea Vomiting This list may not describe all possible side effects. Call your doctor for medical advice about side effects. You may report side effects to FDA at 1-800-FDA-1088. Where should I keep my medication? This medication is given in a hospital or clinic. It will not be stored at home. NOTE: This sheet is a summary. It may not cover all possible information. If you have questions about this medicine, talk to your doctor, pharmacist, or health care provider.  2023 Elsevier/Gold Standard (2007-07-18 00:00:00)  Gemcitabine Injection What is this medication? GEMCITABINE (jem SYE ta been) treats some types of cancer. It works by slowing down the growth of cancer cells. This medicine may be used for other purposes; ask your health care provider or pharmacist if you have questions. COMMON BRAND NAME(S): Gemzar, Infugem What should I tell my care team before I take this medication? They need to know if you have any of these conditions: Blood disorders Infection Kidney disease Liver disease Lung or breathing disease, such as asthma or COPD Recent or ongoing radiation therapy An unusual or allergic reaction to gemcitabine, other medications, foods, dyes, or preservatives If you or your partner are pregnant or trying to get pregnant Breast-feeding How should I use this medication? This medication is injected into a vein. It is given by your care team in a hospital or clinic setting. Talk to your care team about the use of this medication in children. Special care may be needed. Overdosage: If you think you have taken too much of this medicine contact a poison control center or emergency room at once. NOTE: This medicine is only for you. Do not share this medicine with others. What if I miss a dose? Keep appointments for follow-up doses. It is important not to miss your dose. Call your care team if you are unable to keep an appointment. What may interact with this  medication? Interactions have not been studied. This list may not describe all possible interactions. Give your health care provider a list of all the medicines, herbs, non-prescription drugs, or dietary supplements you use. Also tell them if you smoke, drink alcohol, or use illegal drugs. Some items may interact with your medicine. What should I watch for while using this medication? Your condition will be monitored carefully while you are receiving this medication. This medication may make you feel generally unwell. This is not uncommon, as chemotherapy can affect healthy cells as well as cancer cells. Report any side effects. Continue your course of treatment even though you feel ill unless your care team tells you to stop. In some cases, you may be given additional medications to help with side effects. Follow all directions for their use. This medication may increase your risk of getting an infection. Call your care team for advice if you get a fever, chills, sore throat, or other symptoms of a cold or flu. Do not treat yourself. Try to   avoid being around people who are sick. This medication may increase your risk to bruise or bleed. Call your care team if you notice any unusual bleeding. Be careful brushing or flossing your teeth or using a toothpick because you may get an infection or bleed more easily. If you have any dental work done, tell your dentist you are receiving this medication. Avoid taking medications that contain aspirin, acetaminophen, ibuprofen, naproxen, or ketoprofen unless instructed by your care team. These medications may hide a fever. Talk to your care team if you or your partner wish to become pregnant or think you might be pregnant. This medication can cause serious birth defects if taken during pregnancy and for 6 months after the last dose. A negative pregnancy test is required before starting this medication. A reliable form of contraception is recommended while taking  this medication and for 6 months after the last dose. Talk to your care team about effective forms of contraception. Do not father a child while taking this medication and for 3 months after the last dose. Use a condom while having sex during this time period. Do not breastfeed while taking this medication and for at least 1 week after the last dose. This medication may cause infertility. Talk to your care team if you are concerned about your fertility. What side effects may I notice from receiving this medication? Side effects that you should report to your care team as soon as possible: Allergic reactions--skin rash, itching, hives, swelling of the face, lips, tongue, or throat Capillary leak syndrome--stomach or muscle pain, unusual weakness or fatigue, feeling faint or lightheaded, decrease in the amount of urine, swelling of the ankles, hands, or feet, trouble breathing Infection--fever, chills, cough, sore throat, wounds that don't heal, pain or trouble when passing urine, general feeling of discomfort or being unwell Liver injury--right upper belly pain, loss of appetite, nausea, light-colored stool, dark yellow or brown urine, yellowing skin or eyes, unusual weakness or fatigue Low red blood cell level--unusual weakness or fatigue, dizziness, headache, trouble breathing Lung injury--shortness of breath or trouble breathing, cough, spitting up blood, chest pain, fever Stomach pain, bloody diarrhea, pale skin, unusual weakness or fatigue, decrease in the amount of urine, which may be signs of hemolytic uremic syndrome Sudden and severe headache, confusion, change in vision, seizures, which may be signs of posterior reversible encephalopathy syndrome (PRES) Unusual bruising or bleeding Side effects that usually do not require medical attention (report to your care team if they continue or are bothersome): Diarrhea Drowsiness Hair loss Nausea Pain, redness, or swelling with sores inside the  mouth or throat Vomiting This list may not describe all possible side effects. Call your doctor for medical advice about side effects. You may report side effects to FDA at 1-800-FDA-1088. Where should I keep my medication? This medication is given in a hospital or clinic. It will not be stored at home. NOTE: This sheet is a summary. It may not cover all possible information. If you have questions about this medicine, talk to your doctor, pharmacist, or health care provider.  2023 Elsevier/Gold Standard (2021-10-02 00:00:00)  

## 2022-09-26 NOTE — Patient Instructions (Signed)

## 2022-09-26 NOTE — Progress Notes (Signed)
Patient seen by Lonna Cobb NP today  Vitals are within treatment parameters.  Labs reviewed by Lonna Cobb NP and are not all within treatment parameters. Mg 1.6 . Patient will receive 2g of IV mag.  Per physician team, patient is ready for treatment and there are NO modifications to the treatment plan.

## 2022-09-26 NOTE — Progress Notes (Signed)
[facility] OFFICE PROGRESS NOTE   Diagnosis: Pancreas cancer  INTERVAL HISTORY:   [name] returns as scheduled.  She completed a cycle of gemcitabine/Abraxane 09/12/2022.  She was achy and fatigued on day 2.  No nausea or vomiting.  No mouth sores.  Periodic loose stools.  No fever or rash.  She had foot and leg cramps for a few days.  Objective:  Vital signs in last 24 hours:  Blood pressure 117/75, pulse 70, temperature 98.1 F (36.7 C), resp. rate 18, height  (1.702 m), weight 197 lb 12.8 oz (89.7 kg), last menstrual period 09/17/2012, SpO2 100 %.    HEENT: No thrush or ulcers. Resp: Lungs clear bilaterally. Cardio: Regular rate and rhythm. GI: No hepatosplenomegaly. Vascular: No leg edema.   Lab Results:  Lab Results  Component Value Date   WBC 4.8 09/26/2022   HGB 9.6 (L) 09/26/2022   HCT 30.0 (L) 09/26/2022   MCV 96.2 09/26/2022   PLT 444 (H) 09/26/2022   NEUTROABS 2.9 09/26/2022    Imaging:  No results found.  Medications: I have reviewed the patient's current medications.  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 Tumor invades the duodenal wall and vascular groove, resection margins negative, 4/34 lymph nodes positive MSI-stable, tumor showed instability in 2 loci as did adjacent normal tissue Foundation 1-K-ras G12 V, microsatellite status and tumor mutation burden cannot be determined EUS FNA biopsy of pancreas mass on 07/03/2018-well-differentiated neuroendocrine tumor CTs 01/29/2019-ill-defined pancreas head mass, 5 pulmonary nodules-1 with a small amount of central cavitation, tumor abuts the left margin of the portal vein indistinct density surrounding, hepatic artery, complex cystic lesion of the right kidney, right adrenal mass-characterized as an adenoma on a [facility] MRI 12/21/2018 [facility] 03/03/2019-no focal pancreas activity, no tracer accumulation within the suspicious  pulmonary nodules, left uterine mass with tracer accumulation felt to represent a leiomyoma Elevated preoperative CA 19-9--CA 19-9 186 on 01/18/2019 CT chest 04/16/2019-multiple bilateral pulmonary nodules, some with increased cavitation, stable in size Cycle 1 FOLFIRINOX 04/27/2019 Cycle 2 FOLFIRINOX 05/11/2019 Cycle 3 FOLFIRINOX 05/23/2019 Cycle 4 FOLFIRINOX 06/08/2019 Cycle 5 FOLFIRINOX 06/22/2019 CT chest 07/02/2019-stable size of bilateral pulmonary nodules.  Dominant cavitary lesions in both lungs show increased cavitation with thinner walls.  Stable 2.1 cm right adrenal nodule. 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 No definite evidence of metastatic disease in the abdomen or pelvis. Markedly enlarged and heterogeneous appearing uterus, likely to represent multifocal fibroids. 1 of these lesions appears to be an exophytic subserosal fibroid in the posterior lateral aspect of the uterine body on the left side although this comes in close proximity to the left adnexa such that a primary ovarian lesion is difficult to completely exclude. CTs 02/07/2020-slight enlargement of bilateral lung nodules, some are cavitary, no evidence of metastatic disease in the abdomen or pelvis, stable right adrenal nodule, uterine fibroids CTs 04/26/2020-mild enlargement of pulmonary nodules, slight increase in trace pelvic fluid, new soft tissue thickening inferior to the cecal tip suspicious for peritoneal metastasis CT 05/26/2020-improved appearance of soft tissue at the inferior tip of the cecum, mildly  thickened short appendix-findings suggestive of resolving appendicitis,  stable small bibasilar pulmonary nodules, fibroids Plan biopsy of right cecal tip soft tissue canceled secondary to radiologic improvement CT chest 08/02/2020-enlargement and progressive cavitation of multiple bilateral lung nodules.  Some new nodules are present. CTs 10/24/2020- increase in size of pulmonary nodules, no new nodules, no evidence of metastatic disease in the abdomen, stable right adrenal nodule CT 01/09/2021-slight interval enlargement of pulmonary nodules, stable right adrenal nodule Navigation bronchoscopy 01/30/2021-left lower lobe cavitary nodule FNA-adenocarcinoma, brushing-adenocarcinoma.  Left lower lobe lavage-adenocarcinoma.  Right upper lobe brushing and FNA biopsy of cavitary nodule-adenocarcinoma-immunohistochemical profile consistent with pancreas adenocarcinoma Cycle 1 gemcitabine/Abraxane 03/28/2021 Cycle 2 gemcitabine/Abraxane 04/11/2021 Cycle 3 gemcitabine/Abraxane 04/25/2021 Cycle 4 gemcitabine/Abraxane 05/09/2021 Cycle 5 gemcitabine/Abraxane 05/23/2021 CT chest 06/05/2021-interval cavitation of multiple pulmonary nodules, some nodules have decreased in size, no new or enlarging nodules Cycle 6 gemcitabine/Abraxane 06/06/2021 Cycle 7 gemcitabine/Abraxane 06/21/2021 Cycle 8 gemcitabine/Abraxane 07/05/2021 Cycle 9 Gemcitabine/Abraxane 07/19/2021 Cycle 10 gemcitabine 08/01/2021-Abraxane held secondary to neuropathy CT chest 08/13/2021-mild decrease in size and wall thickness of multiple cavitary nodules, no new or progressive disease in the chest, indeterminate low-attenuation right liver lesions Cycle 11 gemcitabine 08/16/2021-Abraxane held secondary to neuropathy Cycle 12 gemcitabine 08/30/2021-Abraxane held secondary to neuropathy Cycle 13 gemcitabine 09/13/2021-Abraxane held secondary to neuropathy Cycle 14 gemcitabine 09/27/2021-Abraxane held secondary to neuropathy Cycle 15 gemcitabine 10/11/2021-Abraxane  held secondary to neuropathy CTs 10/22/2021-no change in multiple cavitary lung nodules, no evidence of disease progression, ill-defined hypodense lesion in the posterior right liver suspicious for metastatic disease Cycle 16 gemcitabine 10/25/2021 Cycle 17 gemcitabine 11/08/2021 Cycle 18 Gemcitabine 11/22/2021 Cycle 19 gemcitabine 12/06/2021 Cycle 20 Gemcitabine 12/20/2021 CT 12/31/2021-mild increase in size of bilateral pulmonary metastases, stable subtle continuation right liver lesions Cycle 20 gemcitabine/Abraxane 01/03/2022 Cycle 21 gemcitabine/Abraxane 01/17/2022 Cycle 22 gemcitabine/Abraxane 01/31/2022 Cycle 23 gemcitabine/Abraxane 02/14/2022 Cycle 24 gemcitabine/Abraxane 02/28/2022 CTs 03/11/2022-widespread metastatic disease to the lungs again noted with slight involution of several of the pulmonary nodules, no definite new nodules noted; interval cavitation of solid lesion in the posterior aspect right lobe of the liver, no new liver lesions noted. Cycle 25 Gemcitabine/Abraxane 03/14/2022 Cycle 26 gemcitabine/Abraxane 03/28/2022 Cycle 27 gemcitabine/Abraxane 04/25/2022 Cycle 28 gemcitabine/Abraxane 05/09/2022 Cycle 29 Gemcitabine 05/23/2022, Abraxane held due to neuropathy CTs 06/04/2022-stable multifocal cavitary pulmonary nodules.  No definite new nodules.  No new nodules greater than a centimeter.  Decreased size of the right posterior hepatic lobe lesion.  Generalized heterogeneity and new focal areas of hypodensity in the liver. Cycle 30 Gemcitabine 06/06/2022, Abraxane held due to neuropathy Cycle 31 gemcitabine 06/20/2022, Abraxane held due to neuropathy Cycle 32 gemcitabine 07/04/2022, Abraxane held due to neuropathy Cycle 33 gemcitabine  07/18/2022 ,Abraxane held due to neuropathy Cycle 34 gemcitabine 08/15/2022, Abraxane held due to neuropathy Cycle 35 gemcitabine 08/29/2022, Abraxane held due to neuropathy CTs 09/10/2022-enlarging pulmonary metastases.  Subtle poorly defined hypoattenuating  lesions in the liver, less well-seen than on 06/04/2022.  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 appears stable.  She completed a cycle of gemcitabine/Abraxane 09/12/2022.  Plan to proceed with the next cycle today as scheduled.  CBC and chemistry panel reviewed.  Labs adequate to proceed as above.  Magnesium level mildly decreased.  She will receive 2 g of IV magnesium today.  She will return for lab, follow-up, Gemcitabine/Abraxane in 2 weeks.  She will contact the office in the interim with any problems.    Lonna Cobb ANP/GNP-BC   09/26/2022  10:31 AM

## 2022-09-30 ENCOUNTER — Encounter: Payer: Self-pay | Admitting: Dermatology

## 2022-10-06 ENCOUNTER — Ambulatory Visit
Admission: EM | Admit: 2022-10-06 | Discharge: 2022-10-06 | Disposition: A | Discharge status: Home / Self Care | Payer: BC Managed Care – PPO | Attending: Urgent Care | Admitting: Urgent Care

## 2022-10-06 ENCOUNTER — Other Ambulatory Visit: Payer: Self-pay | Admitting: Oncology

## 2022-10-06 DIAGNOSIS — C250 Malignant neoplasm of head of pancreas: Secondary | ICD-10-CM

## 2022-10-06 DIAGNOSIS — B9689 Other specified bacterial agents as the cause of diseases classified elsewhere: Secondary | ICD-10-CM | POA: Insufficient documentation

## 2022-10-06 DIAGNOSIS — C259 Malignant neoplasm of pancreas, unspecified: Secondary | ICD-10-CM | POA: Diagnosis not present

## 2022-10-06 DIAGNOSIS — B379 Candidiasis, unspecified: Secondary | ICD-10-CM | POA: Insufficient documentation

## 2022-10-06 DIAGNOSIS — N760 Acute vaginitis: Secondary | ICD-10-CM | POA: Diagnosis not present

## 2022-10-06 DIAGNOSIS — N898 Other specified noninflammatory disorders of vagina: Secondary | ICD-10-CM | POA: Insufficient documentation

## 2022-10-06 MED ORDER — FLUCONAZOLE 150 MG PO TABS: 150.0000 mg | ORAL_TABLET | ORAL | 0 refills | Status: DC

## 2022-10-06 MED ORDER — METRONIDAZOLE 500 MG PO TABS: 500.0000 mg | ORAL_TABLET | Freq: Two times a day (BID) | ORAL | 0 refills | Status: DC

## 2022-10-06 NOTE — ED Provider Notes (Signed)
[facility] - URGENT CARE CENTER  Note:  This document was prepared using Conservation officer, historic buildings and may include unintentional dictation errors.  MRN: [id number] DOB: Oct 15, 1965  Subjective:   Alyssa Ross is a 57 y.o. female presenting for 1 day history of sick brown dark vaginal discharge.  Feels vaginal dryness and irritation.  Has a history of BV and yeast infections.  Patient is currently undergoing cancer treatment for pancreatic cancer.  She is immunosuppressed.  No urinary symptoms.  She does have a history of uterine fibroids.  Denies fever, n/v, abdominal pain, pelvic pain, rashes, dysuria, urinary frequency.  She does have a gynecologist that she has gotten regular checkups with.  She did undergo menopause many years ago.  Would like to be checked for BV, yeast infection and also STIs.  She does take doxycycline daily for acne.  No current facility-administered medications for this encounter.  Current Outpatient Medications:    cetirizine (ZYRTEC) 10 MG tablet, Take 10 mg by mouth daily., Disp: , Rfl:    Continuous Blood Gluc Receiver (FREESTYLE LIBRE 14 DAY READER) DEVI, USE READER TO CHECK BLOOD GLUCOSE WITH FREESTYLE LIBRE SENSORS., Disp: 1 Device, Rfl: 0   Continuous Blood Gluc Sensor (FREESTYLE LIBRE 2 SENSOR) MISC, APPLY EVERY 14 DAYS, Disp: 2 each, Rfl: 5   doxycycline (VIBRA-TABS) 100 MG tablet, Take 100 mg by mouth daily., Disp: , Rfl:    glucose blood test strip, Check sugar 2 times daily., Disp: 100 each, Rfl: 12   Insulin Degludec (TRESIBA FLEXTOUCH [place]), Inject into the skin. 12-18 Units, Disp: , Rfl:    insulin lispro (HUMALOG KWIKPEN) 200 UNIT/ML KwikPen, 6 units with breakfast, 13 units with lunch, 16 units with dinner, Disp: , Rfl:    Insulin Pen Needle (BD PEN NEEDLE NANO U/F) 32G X 4 MM MISC, USE TO INJECT INSULIN THREE TIMES A DAY, Disp: 270 each, Rfl: 3   lidocaine-prilocaine (EMLA) cream, Apply 1 Application topically as directed. Apply 1 hour prior  to stick and cover with plastic wrap, Disp: 30 g, Rfl: 5   magic mouthwash SOLN, Take 5 mLs by mouth 4 (four) times daily as needed for mouth pain. (Patient not taking: Reported on 04/25/2022), Disp: 240 mL, Rfl: 0   magnesium oxide (MAG-OX) 400 (240 Mg) MG tablet, TAKE 1 TABLET(400 MG) BY MOUTH TWICE DAILY, Disp: 180 tablet, Rfl: 1   metroNIDAZOLE (METROCREAM) 0.75 % cream, Apply 1 Application topically 2 (two) times daily. To face, Disp: , Rfl:    potassium chloride SA (KLOR-CON M) 20 MEQ tablet, Take 1 tablet (20 mEq total) by mouth 2 (two) times daily., Disp: 180 tablet, Rfl: 1   prochlorperazine (COMPAZINE) 10 MG tablet, TAKE 1 TABLET(10 MG) BY MOUTH EVERY 6 HOURS AS NEEDED FOR NAUSEA OR VOMITING (Patient not taking: Reported on 05/09/2022), Disp: 60 tablet, Rfl: 0   rosuvastatin (CRESTOR) 10 MG tablet, Take 1 tablet (10 mg total) by mouth at bedtime., Disp: 90 tablet, Rfl: 2   spironolactone (ALDACTONE) 25 MG tablet, Take 1 tablet (25 mg total) by mouth daily., Disp: 90 tablet, Rfl: 2   tretinoin (RETIN-A) 0.05 % cream, Apply topically at bedtime. (Patient not taking: Reported on 07/04/2022), Disp: , Rfl:    valsartan (DIOVAN) 160 MG tablet, TAKE 1 TABLET BY MOUTH TWICE DAILY, Disp: 180 tablet, Rfl: 1   Allergies  Allergen Reactions   Cherry Rash, Itching and Other (See Comments)    Other reaction(s): Unknown Other reaction(s): Unknown   Lemon Oil  Rash    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   Lung nodule    pancreatic ca dx'd 11/2018   Neuroendocrine Tumor of the pancreas   PONV (postoperative  nausea and vomiting)    nausea  vomitting after 12/25/18 ERCP   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   Yeast vaginitis 2006     Past Surgical History:  Procedure Laterality Date   BILIARY STENT PLACEMENT N/A 12/25/2018   Procedure: BILIARY STENT PLACEMENT;  Surgeon: Jeani Hawking, MD;  Location: [facility];  Service: Endoscopy;  Laterality: N/A;   BILIARY STENT PLACEMENT N/A 01/01/2019   Procedure: BILIARY STENT PLACEMENT;  Surgeon: Jeani Hawking, MD;  Location: [facility];  Service: Endoscopy;  Laterality: N/A;   BRONCHIAL BIOPSY  01/30/2021   Procedure: BRONCHIAL BIOPSIES;  Surgeon: Josephine Igo, DO;  Location: [facility];  Service: Pulmonary;;   BRONCHIAL BRUSHINGS  01/30/2021   Procedure: BRONCHIAL BRUSHINGS;  Surgeon: Josephine Igo, DO;  Location: [facility];  Service: Pulmonary;;   BRONCHIAL NEEDLE ASPIRATION BIOPSY  01/30/2021   Procedure: BRONCHIAL NEEDLE ASPIRATION BIOPSIES;  Surgeon: Josephine Igo, DO;  Location: [facility];  Service: Pulmonary;;   BRONCHIAL WASHINGS  01/30/2021   Procedure: BRONCHIAL WASHINGS;  Surgeon: Josephine Igo, DO;  Location: [facility];  Service: Pulmonary;;   DILATATION & CURETTAGE/HYSTEROSCOPY WITH TRUECLEAR N/A 01/20/2014   Procedure: DILATATION & CURETTAGE/HYSTEROSCOPY WITH TRUCLEAR;  Surgeon: Michael Litter, MD;  Location: [facility];  Service: Gynecology;  Laterality: N/A;   DILATATION & CURRETTAGE/HYSTEROSCOPY WITH RESECTOCOPE N/A 01/20/2014   Procedure: DILATATION & CURETTAGE/HYSTEROSCOPY WITH RESECTOCOPE;  Surgeon: Michael Litter, MD;  Location: [facility];  Service: Gynecology;  Laterality: N/A;   ENDOSCOPIC RETROGRADE CHOLANGIOPANCREATOGRAPHY (ERCP) WITH PROPOFOL N/A 12/25/2018   Procedure: ENDOSCOPIC RETROGRADE CHOLANGIOPANCREATOGRAPHY (ERCP) WITH PROPOFOL;  Surgeon: Jeani Hawking, MD;  Location: [facility];  Service: Endoscopy;  Laterality: N/A;   ENDOSCOPIC RETROGRADE CHOLANGIOPANCREATOGRAPHY (ERCP) WITH  PROPOFOL N/A 01/01/2019   Procedure: ENDOSCOPIC RETROGRADE CHOLANGIOPANCREATOGRAPHY (ERCP) WITH PROPOFOL;  Surgeon: Jeani Hawking, MD;  Location: [facility];  Service: Endoscopy;  Laterality: N/A;   ERCP  12/25/2018   FINE NEEDLE ASPIRATION N/A 01/01/2019   Procedure: FINE NEEDLE ASPIRATION (FNA) LINEAR;  Surgeon: Jeani Hawking, MD;  Location: [facility];  Service: Endoscopy;  Laterality: N/A;   LAPAROSCOPY N/A 03/04/2019   Procedure: LAPAROSCOPY DIAGNOSTIC;  Surgeon: Almond Lint, MD;  Location: [facility];  Service: General;  Laterality: N/A;  GENERAL AND EPIDURAL   PARTIAL NEPHRECTOMY Right 03/04/2019   Procedure: Nephrectomy Partial;  Surgeon: Malen Gauze, MD;  Location: [facility] OR;  Service: Urology;  Laterality: Right;   PORTACATH PLACEMENT Left 04/21/2019   Procedure: INSERTION PORT-A-CATH;  Surgeon: Almond Lint, MD;  Location: [facility];  Service: General;  Laterality: Left;   SPHINCTEROTOMY  12/25/2018   Procedure: Dennison Mascot;  Surgeon: Jeani Hawking, MD;  Location: [facility];  Service: Endoscopy;;   STENT REMOVAL  01/01/2019   Procedure: STENT REMOVAL;  Surgeon: Jeani Hawking, MD;  Location: [facility];  Service: Endoscopy;;   UPPER ESOPHAGEAL ENDOSCOPIC  ULTRASOUND (EUS) N/A 01/01/2019   Procedure: UPPER ESOPHAGEAL ENDOSCOPIC ULTRASOUND (EUS);  Surgeon: Jeani Hawking, MD;  Location: [facility];  Service: Endoscopy;  Laterality: N/A;   VIDEO BRONCHOSCOPY WITH ENDOBRONCHIAL NAVIGATION Bilateral 01/30/2021   Procedure: VIDEO BRONCHOSCOPY WITH ENDOBRONCHIAL NAVIGATION;  Surgeon: Josephine Igo, DO;  Location: [facility];  Service: Pulmonary;  Laterality: Bilateral;  ION   VIDEO BRONCHOSCOPY WITH RADIAL ENDOBRONCHIAL ULTRASOUND  01/30/2021   Procedure: RADIAL ENDOBRONCHIAL ULTRASOUND;  Surgeon: Josephine Igo, DO;  Location: [facility];  Service: Pulmonary;;   WHIPPLE PROCEDURE N/A 03/04/2019   Procedure: WHIPPLE PROCEDURE;  Surgeon: Almond Lint, MD;  Location: [facility];   Service: General;  Laterality: N/A;  GENERAL AND EPIDURAL    Family History  Problem Relation Age of Onset   Diabetes Mother    Dementia Mother    Hyperlipidemia Mother    Hypertension Mother    Irregular heart beat Mother    Diabetes Father    Hypertension Father    Hyperlipidemia Father    Down syndrome Sister    Diabetes Sister    Hyperlipidemia Brother    Heart Problems Brother    Goiter Maternal Aunt    Thyroid nodules Sister    Cancer Cousin 60       eye; maternal first cousin   Goiter Cousin    Cancer Paternal Aunt        unknown form of cancer   Cancer Cousin        unknown form of cancer; paternal first cousin   Pancreatic cancer Cousin 70       paternal first cousin   Cancer Cousin 22       unknown cancer; paternal first cousin   Cancer Cousin 89       unknown cancer; paternal first cousin    Social History   Tobacco Use   Smoking status: Never   Smokeless tobacco: Never  Vaping Use   Vaping Use: Never used  Substance Use Topics   Alcohol use: Not Currently   Drug use: No    ROS   Objective:   Vitals: BP 129/76 (BP Location: Right Arm)   Pulse 82   Temp 98.8 F (37.1 C) (Oral)   Resp 16   LMP 09/17/2012   SpO2 97%   Physical Exam Constitutional:      General: She is not in acute distress.    Appearance: Normal appearance. She is well-developed. She is not ill-appearing, toxic-appearing or diaphoretic.  HENT:     Head: Normocephalic and atraumatic.     Nose: Nose normal.     Mouth/Throat:     Mouth: Mucous membranes are moist.  Eyes:     General: No scleral icterus.       Right eye: No discharge.        Left eye: No discharge.     Extraocular Movements: Extraocular movements intact.     Conjunctiva/sclera: Conjunctivae normal.  Cardiovascular:     Rate and Rhythm: Normal rate.  Pulmonary:     Effort: Pulmonary effort is normal.  Abdominal:     General: Bowel sounds are normal. There is no distension.     Palpations: Abdomen is  soft. There is no mass.     Tenderness: There is no abdominal tenderness. There is no right CVA tenderness, left CVA tenderness, guarding or rebound.  Skin:    General: Skin is warm and dry.  Neurological:     General: No focal deficit present.  Mental Status: She is alert and oriented to person, place, and time.  Psychiatric:        Mood and Affect: Mood normal.        Behavior: Behavior normal.        Thought Content: Thought content normal.        Judgment: Judgment normal.     Assessment and Plan :   PDMP not reviewed this encounter.  1. Bacterial vaginosis   2. Vaginal discharge   3. Yeast infection   4. Malignant neoplasm of pancreas, unspecified location of malignancy (HCC)     We will treat patient empirically for bacterial vaginosis with Flagyl and for yeast vaginitis with fluconazole.  Labs pending. Counseled patient on potential for adverse effects with medications prescribed/recommended today, ER and return-to-clinic precautions discussed, patient verbalized understanding.    Wallis Bamberg, [place] 10/06/22 [id number]

## 2022-10-06 NOTE — ED Triage Notes (Addendum)
Pt c/o vaginal d/c that she states "looks like dried blood and (vaginal area) feels dry" started yesterday-NAD-steady gait

## 2022-10-07 LAB — CERVICOVAGINAL ANCILLARY ONLY
Bacterial Vaginitis (gardnerella): NEGATIVE
Candida Glabrata: NEGATIVE
Candida Vaginitis: NEGATIVE
Chlamydia: NEGATIVE
Comment: NEGATIVE
Comment: NEGATIVE
Comment: NEGATIVE
Comment: NEGATIVE
Comment: NEGATIVE
Comment: NORMAL
Neisseria Gonorrhea: NEGATIVE
Trichomonas: NEGATIVE

## 2022-10-08 DIAGNOSIS — K8689 Other specified diseases of pancreas: Secondary | ICD-10-CM | POA: Diagnosis not present

## 2022-10-08 DIAGNOSIS — Z794 Long term (current) use of insulin: Secondary | ICD-10-CM | POA: Diagnosis not present

## 2022-10-08 DIAGNOSIS — Z9049 Acquired absence of other specified parts of digestive tract: Secondary | ICD-10-CM | POA: Diagnosis not present

## 2022-10-08 DIAGNOSIS — E089 Diabetes mellitus due to underlying condition without complications: Secondary | ICD-10-CM | POA: Diagnosis not present

## 2022-10-08 DIAGNOSIS — E109 Type 1 diabetes mellitus without complications: Secondary | ICD-10-CM | POA: Diagnosis not present

## 2022-10-10 ENCOUNTER — Inpatient Hospital Stay: Payer: BC Managed Care – PPO | Admitting: Oncology

## 2022-10-10 ENCOUNTER — Encounter: Payer: Self-pay | Admitting: *Deleted

## 2022-10-10 ENCOUNTER — Inpatient Hospital Stay: Payer: BC Managed Care – PPO

## 2022-10-10 ENCOUNTER — Inpatient Hospital Stay: Payer: BC Managed Care – PPO | Attending: Genetic Counselor

## 2022-10-10 VITALS — BP 115/78 | HR 58

## 2022-10-10 DIAGNOSIS — C7801 Secondary malignant neoplasm of right lung: Secondary | ICD-10-CM | POA: Diagnosis not present

## 2022-10-10 DIAGNOSIS — E1140 Type 2 diabetes mellitus with diabetic neuropathy, unspecified: Secondary | ICD-10-CM | POA: Insufficient documentation

## 2022-10-10 DIAGNOSIS — K769 Liver disease, unspecified: Secondary | ICD-10-CM | POA: Insufficient documentation

## 2022-10-10 DIAGNOSIS — C250 Malignant neoplasm of head of pancreas: Secondary | ICD-10-CM

## 2022-10-10 DIAGNOSIS — I10 Essential (primary) hypertension: Secondary | ICD-10-CM | POA: Insufficient documentation

## 2022-10-10 DIAGNOSIS — N6321 Unspecified lump in the left breast, upper outer quadrant: Secondary | ICD-10-CM | POA: Diagnosis not present

## 2022-10-10 DIAGNOSIS — Z79899 Other long term (current) drug therapy: Secondary | ICD-10-CM | POA: Diagnosis not present

## 2022-10-10 DIAGNOSIS — Z5111 Encounter for antineoplastic chemotherapy: Secondary | ICD-10-CM | POA: Insufficient documentation

## 2022-10-10 DIAGNOSIS — C7802 Secondary malignant neoplasm of left lung: Secondary | ICD-10-CM | POA: Insufficient documentation

## 2022-10-10 LAB — CMP (CANCER CENTER ONLY)
ALT: 18 U/L (ref 0–44)
AST: 19 U/L (ref 15–41)
Albumin: 3.6 g/dL (ref 3.5–5.0)
Alkaline Phosphatase: 178 U/L — ABNORMAL HIGH (ref 38–126)
Anion gap: 5 (ref 5–15)
BUN: 12 mg/dL (ref 6–20)
CO2: 22 mmol/L (ref 22–32)
Calcium: 8.5 mg/dL — ABNORMAL LOW (ref 8.9–10.3)
Chloride: 110 mmol/L (ref 98–111)
Creatinine: 1.11 mg/dL — ABNORMAL HIGH (ref 0.44–1.00)
GFR, Estimated: 58 mL/min — ABNORMAL LOW (ref >60)
Glucose, Bld: 183 mg/dL — ABNORMAL HIGH (ref 70–99)
Potassium: 3.7 mmol/L (ref 3.5–5.1)
Sodium: 137 mmol/L (ref 135–145)
Total Bilirubin: 0.4 mg/dL (ref 0.3–1.2)
Total Protein: 7 g/dL (ref 6.5–8.1)

## 2022-10-10 LAB — CBC WITH DIFFERENTIAL (CANCER CENTER ONLY)
Abs Immature Granulocytes: 0.02 10*3/uL (ref 0.00–0.07)
Basophils Absolute: 0 10*3/uL (ref 0.0–0.1)
Basophils Relative: 1 %
Eosinophils Absolute: 0.1 10*3/uL (ref 0.0–0.5)
Eosinophils Relative: 2 %
HCT: 29.2 % — ABNORMAL LOW (ref 36.0–46.0)
Hemoglobin: 8.9 g/dL — ABNORMAL LOW (ref 12.0–15.0)
Immature Granulocytes: 0 %
Lymphocytes Relative: 18 %
Lymphs Abs: 1 10*3/uL (ref 0.7–4.0)
MCH: 29.4 pg (ref 26.0–34.0)
MCHC: 30.5 g/dL (ref 30.0–36.0)
MCV: 96.4 fL (ref 80.0–100.0)
Monocytes Absolute: 0.7 10*3/uL (ref 0.1–1.0)
Monocytes Relative: 13 %
Neutro Abs: 3.8 10*3/uL (ref 1.7–7.7)
Neutrophils Relative %: 66 %
Platelet Count: 290 10*3/uL (ref 150–400)
RBC: 3.03 MIL/uL — ABNORMAL LOW (ref 3.87–5.11)
RDW: 14.5 % (ref 11.5–15.5)
WBC Count: 5.7 10*3/uL (ref 4.0–10.5)
nRBC: 0 % (ref 0.0–0.2)

## 2022-10-10 LAB — MAGNESIUM: Magnesium: 1.7 mg/dL (ref 1.7–2.4)

## 2022-10-10 MED ORDER — PACLITAXEL PROTEIN-BOUND CHEMO INJECTION 100 MG
100.0000 mg/m2 | Freq: Once | INTRAVENOUS | Status: AC
  Administered 2022-10-10: 200 mg via INTRAVENOUS
  Filled 2022-10-10: qty 40

## 2022-10-10 MED ORDER — SODIUM CHLORIDE 0.9 % IV SOLN: Freq: Once | INTRAVENOUS | Status: AC

## 2022-10-10 MED ORDER — PROCHLORPERAZINE MALEATE 10 MG PO TABS
10.0000 mg | ORAL_TABLET | Freq: Once | ORAL | Status: AC
  Administered 2022-10-10: 10 mg via ORAL
  Filled 2022-10-10: qty 1

## 2022-10-10 MED ORDER — SODIUM CHLORIDE 0.9% FLUSH
10.0000 mL | INTRAVENOUS | Status: DC | PRN
  Administered 2022-10-10: 10 mL

## 2022-10-10 MED ORDER — SODIUM CHLORIDE 0.9 % IV SOLN
2000.0000 mg | Freq: Once | INTRAVENOUS | Status: AC
  Administered 2022-10-10: 2000 mg via INTRAVENOUS
  Filled 2022-10-10: qty 52.6

## 2022-10-10 MED ORDER — HEPARIN SOD (PORK) LOCK FLUSH 100 UNIT/ML IV SOLN
500.0000 [IU] | Freq: Once | INTRAVENOUS | Status: AC | PRN
  Administered 2022-10-10: 500 [IU]

## 2022-10-10 NOTE — Progress Notes (Signed)
[facility] OFF[facility] PROGRESS NOTE   Diagnosis: Pancreas cancer  INTERVAL HISTORY:   [facility] returns as scheduled.  She completed another cycle of gemcitabine/Abraxane 09/26/2022.  She reports worsening of the neuropathy symptoms for a few days following chemotherapy.  The neuropathy then returns to baseline.  Neuropathy symptoms are most apparent in the feet.  The skin rash is improving.  She is working.  Objective:  Vital signs in last 24 hours:  Blood pressure 122/82, pulse 92, temperature 98.1 F (36.7 C), temperature source Oral, resp. rate 18, height 5\' 7"  (1.702 m), weight 199 lb 14.4 oz (90.7 kg), last menstrual period 09/17/2012, SpO2 98 %.    HEENT: No thrush or ulcers Resp: Lungs clear bilaterally Cardio: Regular rate and rhythm GI: Nontender, no mass, no hepatosplenomegaly Vascular: No leg edema  Skin: Acne type rash over the face  Portacath/PICC-without erythema  Lab Results:  Lab Results  Component Value Date   WBC 5.7 10/10/2022   HGB 8.9 (L) 10/10/2022   HCT 29.2 (L) 10/10/2022   MCV 96.4 10/10/2022   PLT 290 10/10/2022   NEUTROABS 3.8 10/10/2022    CMP  Lab Results  Component Value Date   NA 137 10/10/2022   K 3.7 10/10/2022   CL 110 10/10/2022   CO2 22 10/10/2022   GLUCOSE 183 (H) 10/10/2022   BUN 12 10/10/2022   CREATININE 1.11 (H) 10/10/2022   CALCIUM 8.5 (L) 10/10/2022   PROT 7.0 10/10/2022   ALBUMIN 3.6 10/10/2022   AST 19 10/10/2022   ALT 18 10/10/2022   ALKPHOS 178 (H) 10/10/2022   BILITOT 0.4 10/10/2022   GFRNONAA 58 (L) 10/10/2022   GFRAA >60 02/07/2020    Lab Results  Component Value Date   [id number] 147 (H) 09/12/2022     Medications: I have reviewed the patient's current medications.   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 Tumor invades the duodenal wall and vascular groove, resection margins negative, 4/34 lymph nodes positive MSI-stable,  tumor showed instability in 2 loci as did adjacent normal tissue Foundation 1-K-ras G12 V, microsatellite status and tumor mutation burden cannot be determined EUS FNA biopsy of pancreas mass on 07/03/2018-well-differentiated neuroendocrine tumor CTs 01/29/2019-ill-defined pancreas head mass, 5 pulmonary nodules-1 with a small amount of central cavitation, tumor abuts the left margin of the portal vein indistinct density surrounding, hepatic artery, complex cystic lesion of the right kidney, right adrenal mass-characterized as an adenoma on a [facility] MRI 12/21/2018 [facility] 03/03/2019-no focal pancreas activity, no tracer accumulation within the suspicious pulmonary nodules, left uterine mass with tracer accumulation felt to represent a leiomyoma Elevated preoperative CA 19-9--CA 19-9 186 on 01/18/2019 CT chest 04/16/2019-multiple bilateral pulmonary nodules, some with increased cavitation, stable in size Cycle 1 FOLFIRINOX 04/27/2019 Cycle 2 FOLFIRINOX 05/11/2019 Cycle 3 FOLFIRINOX 05/23/2019 Cycle 4 FOLFIRINOX 06/08/2019 Cycle 5 FOLFIRINOX 06/22/2019 CT chest 07/02/2019-stable size of bilateral pulmonary nodules.  Dominant cavitary lesions in both lungs show increased cavitation with thinner walls.  Stable 2.1 cm right adrenal nodule. 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 No definite evidence of metastatic disease in the abdomen or pelvis. Markedly enlarged and heterogeneous appearing  uterus, likely to represent multifocal fibroids. 1 of these lesions appears to be an exophytic subserosal  fibroid in the posterior lateral aspect of the uterine body on the left side although this comes in close proximity to the left adnexa such that a primary ovarian lesion is difficult to completely exclude. CTs 02/07/2020-slight enlargement of bilateral lung nodules, some are cavitary, no evidence of metastatic disease in the abdomen or pelvis, stable right adrenal nodule, uterine fibroids CTs 04/26/2020-mild enlargement of pulmonary nodules, slight increase in trace pelvic fluid, new soft tissue thickening inferior to the cecal tip suspicious for peritoneal metastasis CT 05/26/2020-improved appearance of soft tissue at the inferior tip of the cecum, mildly thickened short appendix-findings suggestive of resolving appendicitis, stable small bibasilar pulmonary nodules, fibroids Plan biopsy of right cecal tip soft tissue canceled secondary to radiologic improvement CT chest 08/02/2020-enlargement and progressive cavitation of multiple bilateral lung nodules.  Some new nodules are present. CTs 10/24/2020- increase in size of pulmonary nodules, no new nodules, no evidence of metastatic disease in the abdomen, stable right adrenal nodule CT 01/09/2021-slight interval enlargement of pulmonary nodules, stable right adrenal nodule Navigation bronchoscopy 01/30/2021-left lower lobe cavitary nodule FNA-adenocarcinoma, brushing-adenocarcinoma.  Left lower lobe lavage-adenocarcinoma.  Right upper lobe brushing and FNA biopsy of cavitary nodule-adenocarcinoma-immunohistochemical profile consistent with pancreas adenocarcinoma Cycle 1 gemcitabine/Abraxane 03/28/2021 Cycle 2 gemcitabine/Abraxane 04/11/2021 Cycle 3 gemcitabine/Abraxane 04/25/2021 Cycle 4 gemcitabine/Abraxane 05/09/2021 Cycle 5 gemcitabine/Abraxane 05/23/2021 CT chest 06/05/2021-interval cavitation of multiple pulmonary nodules, some nodules have decreased in size, no new or enlarging nodules Cycle 6 gemcitabine/Abraxane 06/06/2021 Cycle 7  gemcitabine/Abraxane 06/21/2021 Cycle 8 gemcitabine/Abraxane 07/05/2021 Cycle 9 Gemcitabine/Abraxane 07/19/2021 Cycle 10 gemcitabine 08/01/2021-Abraxane held secondary to neuropathy CT chest 08/13/2021-mild decrease in size and wall thickness of multiple cavitary nodules, no new or progressive disease in the chest, indeterminate low-attenuation right liver lesions Cycle 11 gemcitabine 08/16/2021-Abraxane held secondary to neuropathy Cycle 12 gemcitabine 08/30/2021-Abraxane held secondary to neuropathy Cycle 13 gemcitabine 09/13/2021-Abraxane held secondary to neuropathy Cycle 14 gemcitabine 09/27/2021-Abraxane held secondary to neuropathy Cycle 15 gemcitabine 10/11/2021-Abraxane held secondary to neuropathy CTs 10/22/2021-no change in multiple cavitary lung nodules, no evidence of disease progression, ill-defined hypodense lesion in the posterior right liver suspicious for metastatic disease Cycle 16 gemcitabine 10/25/2021 Cycle 17 gemcitabine 11/08/2021 Cycle 18 Gemcitabine 11/22/2021 Cycle 19 gemcitabine 12/06/2021 Cycle 20 Gemcitabine 12/20/2021 CT 12/31/2021-mild increase in size of bilateral pulmonary metastases, stable subtle continuation right liver lesions Cycle 20 gemcitabine/Abraxane 01/03/2022 Cycle 21 gemcitabine/Abraxane 01/17/2022 Cycle 22 gemcitabine/Abraxane 01/31/2022 Cycle 23 gemcitabine/Abraxane 02/14/2022 Cycle 24 gemcitabine/Abraxane 02/28/2022 CTs 03/11/2022-widespread metastatic disease to the lungs again noted with slight involution of several of the pulmonary nodules, no definite new nodules noted; interval cavitation of solid lesion in the posterior aspect right lobe of the liver, no new liver lesions noted. Cycle 25 Gemcitabine/Abraxane 03/14/2022 Cycle 26 gemcitabine/Abraxane 03/28/2022 Cycle 27 gemcitabine/Abraxane 04/25/2022 Cycle 28 gemcitabine/Abraxane 05/09/2022 Cycle 29 Gemcitabine 05/23/2022, Abraxane held due to neuropathy CTs 06/04/2022-stable multifocal cavitary pulmonary nodules.   No definite new nodules.  No new nodules greater than a centimeter.  Decreased size of the right posterior hepatic lobe lesion.  Generalized heterogeneity and new focal areas of hypodensity in the liver. Cycle 30 Gemcitabine 06/06/2022, Abraxane held due to neuropathy Cycle 31 gemcitabine 06/20/2022, Abraxane held due to neuropathy Cycle 32 gemcitabine 07/04/2022, Abraxane held due to neuropathy Cycle 33 gemcitabine  07/18/2022 ,Abraxane held due to neuropathy Cycle 34 gemcitabine 08/15/2022, Abraxane held due to neuropathy Cycle 35 gemcitabine 08/29/2022, Abraxane held due to neuropathy CTs 09/10/2022-enlarging  pulmonary metastases.  Subtle poorly defined hypoattenuating lesions in the liver, less well-seen than on 06/04/2022.  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  She has completed 2 treatments since resuming Abraxane.  Neuropathy symptoms have not progressed.  She will complete another cycle of gemcitabine/Abraxane today.  We will follow-up on the CA 19-9 from today and again in 2 weeks.  If the CA 19-9 is higher with the plan is to obtain restaging CTs prior to the office visit 11/07/2022.  [facility] will return for an office visit and chemotherapy in 2  weeks.  Thornton Papas, MD  10/10/2022  11:13 AM

## 2022-10-10 NOTE — Patient Instructions (Signed)

## 2022-10-10 NOTE — Patient Instructions (Signed)
[facility] [address]   Discharge Instructions: Thank you for choosing [facility] to provide your oncology and hematology care.   If you have a lab appointment with the [facility], please go directly to the [facility] and check in at the registration area.   Wear comfortable clothing and clothing appropriate for easy access to any Portacath or PICC line.   We strive to give you quality time with your provider. You may need to reschedule your appointment if you arrive late (15 or more minutes).  Arriving late affects you and other patients whose appointments are after yours.  Also, if you miss three or more appointments without notifying the office, you may be dismissed from the clinic at the provider's discretion.      For prescription refill requests, have your pharmacy contact our office and allow 72 hours for refills to be completed.    Today you received the following chemotherapy and/or immunotherapy agents Paclitaxel-protein bound (ABRAXANE) & Gemcitabine (GEMZAR).      To help prevent nausea and vomiting after your treatment, we encourage you to take your nausea medication as directed.  BELOW ARE SYMPTOMS THAT SHOULD BE REPORTED IMMEDIATELY: *FEVER GREATER THAN 100.4 F (38 C) OR HIGHER *CHILLS OR SWEATING *NAUSEA AND VOMITING THAT IS NOT CONTROLLED WITH YOUR NAUSEA MEDICATION *UNUSUAL SHORTNESS OF BREATH *UNUSUAL BRUISING OR BLEEDING *URINARY PROBLEMS (pain or burning when urinating, or frequent urination) *BOWEL PROBLEMS (unusual diarrhea, constipation, pain near the anus) TENDERNESS IN MOUTH AND THROAT WITH OR WITHOUT PRESENCE OF ULCERS (sore throat, sores in mouth, or a toothache) UNUSUAL RASH, SWELLING OR PAIN  UNUSUAL VAGINAL DISCHARGE OR ITCHING   Items with * indicate a potential emergency and should be followed up as soon as possible or go to the Emergency Department if any problems should occur.  Please show the  CHEMOTHERAPY ALERT CARD or IMMUNOTHERAPY ALERT CARD at check-in to the Emergency Department and triage nurse.  Should you have questions after your visit or need to cancel or reschedule your appointment, please contact [facility] [address]  Dept: [phone]  and follow the prompts.  Office hours are 8:00 a.m. to 4:30 p.m. Monday - Friday. Please note that voicemails left after 4:00 p.m. may not be returned until the following business day.  We are closed weekends and major holidays. You have access to a nurse at all times for urgent questions. Please call the main number to the clinic Dept: [phone] and follow the prompts.   For any non-urgent questions, you may also contact your provider using MyChart. We now offer e-Visits for anyone 18 and older to request care online for non-urgent symptoms. For details visit [email or web address].   Also download the MyChart app! Go to the app store, search "MyChart", open the app, select [facility], and log in with your MyChart username and password.  Paclitaxel Nanoparticle Albumin-Bound Injection What is this medication? NANOPARTICLE ALBUMIN-BOUND PACLITAXEL (Na no PAHR ti kuhl al BYOO muhn-bound PAK li TAX el) treats some types of cancer. It works by slowing down the growth of cancer cells. This medicine may be used for other purposes; ask your health care provider or pharmacist if you have questions. COMMON BRAND NAME(S): Abraxane What should I tell my care team before I take this medication? They need to know if you have any of these conditions: Liver disease Low white blood cell levels An unusual or allergic reaction to paclitaxel, albumin, other medications, foods,   dyes, or preservatives If you or your partner are pregnant or trying to get pregnant Breast-feeding How should I use this medication? This medication is injected into a vein. It is given by your care team in a hospital or clinic setting. Talk to your  care team about the use of this medication in children. Special care may be needed. Overdosage: If you think you have taken too much of this medicine contact a poison control [facility] or emergency room at once. NOTE: This medicine is only for you. Do not share this medicine with others. What if I miss a dose? Keep appointments for follow-up doses. It is important not to miss your dose. Call your care team if you are unable to keep an appointment. What may interact with this medication? Other medications may affect the way this medication works. Talk with your care team about all of the medications you take. They may suggest changes to your treatment plan to lower the risk of side effects and to make sure your medications work as intended. This list may not describe all possible interactions. Give your health care provider a list of all the medicines, herbs, non-prescription drugs, or dietary supplements you use. Also tell them if you smoke, drink alcohol, or use illegal drugs. Some items may interact with your medicine. What should I watch for while using this medication? Your condition will be monitored carefully while you are receiving this medication. You may need blood work while taking this medication. This medication may make you feel generally unwell. This is not uncommon as chemotherapy can affect healthy cells as well as cancer cells. Report any side effects. Continue your course of treatment even though you feel ill unless your care team tells you to stop. This medication can cause serious allergic reactions. To reduce the risk, your care team may give you other medications to take before receiving this one. Be sure to follow the directions from your care team. This medication may increase your risk of getting an infection. Call your care team for advice if you get a fever, chills, sore throat, or other symptoms of a cold or flu. Do not treat yourself. Try to avoid being around people who are  sick. This medication may increase your risk to bruise or bleed. Call your care team if you notice any unusual bleeding. Be careful brushing or flossing your teeth or using a toothpick because you may get an infection or bleed more easily. If you have any dental work done, tell your dentist you are receiving this medication. Talk to your care team if you or your partner may be pregnant. Serious birth defects can occur if you take this medication during pregnancy and for 6 months after the last dose. You will need a negative pregnancy test before starting this medication. Contraception is recommended while taking this medication and for 6 months after the last dose. Your care team can help you find the option that works for you. If your partner can get pregnant, use a condom during sex while taking this medication and for 3 months after the last dose. Do not breastfeed while taking this medication and for 2 weeks after the last dose. This medication may cause infertility. Talk to your care team if you are concerned about your fertility. What side effects may I notice from receiving this medication? Side effects that you should report to your care team as soon as possible: Allergic reactions--skin rash, itching, hives, swelling of the face, lips, tongue, or   throat Dry cough, shortness of breath or trouble breathing Infection--fever, chills, cough, sore throat, wounds that don't heal, pain or trouble when passing urine, general feeling of discomfort or being unwell Low red blood cell level--unusual weakness or fatigue, dizziness, headache, trouble breathing Pain, tingling, or numbness in the hands or feet Stomach pain, unusual weakness or fatigue, nausea, vomiting, diarrhea, or fever that lasts longer than expected Unusual bruising or bleeding Side effects that usually do not require medical attention (report to your care team if they continue or are bothersome): Diarrhea Fatigue Hair loss Loss of  appetite Nausea Vomiting This list may not describe all possible side effects. Call your doctor for medical advice about side effects. You may report side effects to FDA at 1-800-FDA-1088. Where should I keep my medication? This medication is given in a hospital or clinic. It will not be stored at home. NOTE: This sheet is a summary. It may not cover all possible information. If you have questions about this medicine, talk to your doctor, pharmacist, or health care provider.  2023 Elsevier/Gold Standard (2007-07-18 00:00:00)  Gemcitabine Injection What is this medication? GEMCITABINE (jem SYE ta been) treats some types of cancer. It works by slowing down the growth of cancer cells. This medicine may be used for other purposes; ask your health care provider or pharmacist if you have questions. COMMON BRAND NAME(S): Gemzar, Infugem What should I tell my care team before I take this medication? They need to know if you have any of these conditions: Blood disorders Infection Kidney disease Liver disease Lung or breathing disease, such as asthma or COPD Recent or ongoing radiation therapy An unusual or allergic reaction to gemcitabine, other medications, foods, dyes, or preservatives If you or your partner are pregnant or trying to get pregnant Breast-feeding How should I use this medication? This medication is injected into a vein. It is given by your care team in a hospital or clinic setting. Talk to your care team about the use of this medication in children. Special care may be needed. Overdosage: If you think you have taken too much of this medicine contact a poison control [facility] or emergency room at once. NOTE: This medicine is only for you. Do not share this medicine with others. What if I miss a dose? Keep appointments for follow-up doses. It is important not to miss your dose. Call your care team if you are unable to keep an appointment. What may interact with this  medication? Interactions have not been studied. This list may not describe all possible interactions. Give your health care provider a list of all the medicines, herbs, non-prescription drugs, or dietary supplements you use. Also tell them if you smoke, drink alcohol, or use illegal drugs. Some items may interact with your medicine. What should I watch for while using this medication? Your condition will be monitored carefully while you are receiving this medication. This medication may make you feel generally unwell. This is not uncommon, as chemotherapy can affect healthy cells as well as cancer cells. Report any side effects. Continue your course of treatment even though you feel ill unless your care team tells you to stop. In some cases, you may be given additional medications to help with side effects. Follow all directions for their use. This medication may increase your risk of getting an infection. Call your care team for advice if you get a fever, chills, sore throat, or other symptoms of a cold or flu. Do not treat yourself. Try to   avoid being around people who are sick. This medication may increase your risk to bruise or bleed. Call your care team if you notice any unusual bleeding. Be careful brushing or flossing your teeth or using a toothpick because you may get an infection or bleed more easily. If you have any dental work done, tell your dentist you are receiving this medication. Avoid taking medications that contain aspirin, acetaminophen, ibuprofen, naproxen, or ketoprofen unless instructed by your care team. These medications may hide a fever. Talk to your care team if you or your partner wish to become pregnant or think you might be pregnant. This medication can cause serious birth defects if taken during pregnancy and for 6 months after the last dose. A negative pregnancy test is required before starting this medication. A reliable form of contraception is recommended while taking  this medication and for 6 months after the last dose. Talk to your care team about effective forms of contraception. Do not father a child while taking this medication and for 3 months after the last dose. Use a condom while having sex during this time period. Do not breastfeed while taking this medication and for at least 1 week after the last dose. This medication may cause infertility. Talk to your care team if you are concerned about your fertility. What side effects may I notice from receiving this medication? Side effects that you should report to your care team as soon as possible: Allergic reactions--skin rash, itching, hives, swelling of the face, lips, tongue, or throat Capillary leak syndrome--stomach or muscle pain, unusual weakness or fatigue, feeling faint or lightheaded, decrease in the amount of urine, swelling of the ankles, hands, or feet, trouble breathing Infection--fever, chills, cough, sore throat, wounds that don't heal, pain or trouble when passing urine, general feeling of discomfort or being unwell Liver injury--right upper belly pain, loss of appetite, nausea, light-colored stool, dark yellow or brown urine, yellowing skin or eyes, unusual weakness or fatigue Low red blood cell level--unusual weakness or fatigue, dizziness, headache, trouble breathing Lung injury--shortness of breath or trouble breathing, cough, spitting up blood, chest pain, fever Stomach pain, bloody diarrhea, pale skin, unusual weakness or fatigue, decrease in the amount of urine, which may be signs of hemolytic uremic syndrome Sudden and severe headache, confusion, change in vision, seizures, which may be signs of posterior reversible encephalopathy syndrome (PRES) Unusual bruising or bleeding Side effects that usually do not require medical attention (report to your care team if they continue or are bothersome): Diarrhea Drowsiness Hair loss Nausea Pain, redness, or swelling with sores inside the  mouth or throat Vomiting This list may not describe all possible side effects. Call your doctor for medical advice about side effects. You may report side effects to FDA at 1-800-FDA-1088. Where should I keep my medication? This medication is given in a hospital or clinic. It will not be stored at home. NOTE: This sheet is a summary. It may not cover all possible information. If you have questions about this medicine, talk to your doctor, pharmacist, or health care provider.  2023 Elsevier/Gold Standard (2021-10-02 00:00:00)  

## 2022-10-10 NOTE — Progress Notes (Signed)
Patient seen by Dr. Truett Perna today  Vitals are within treatment parameters.No intervention for BP 122/82  Labs reviewed by Dr. Truett Perna and are within treatment parameters.  Per physician team, patient is ready for treatment and there are NO modifications to the treatment plan.

## 2022-10-12 LAB — CANCER ANTIGEN 19-9: CA 19-9: 196 U/mL — ABNORMAL HIGH (ref 0–35)

## 2022-10-24 ENCOUNTER — Other Ambulatory Visit: Payer: Self-pay

## 2022-10-24 ENCOUNTER — Inpatient Hospital Stay: Payer: BC Managed Care – PPO

## 2022-10-24 ENCOUNTER — Encounter: Payer: Self-pay | Admitting: Nurse Practitioner

## 2022-10-24 ENCOUNTER — Telehealth: Payer: Self-pay

## 2022-10-24 ENCOUNTER — Inpatient Hospital Stay: Payer: BC Managed Care – PPO | Admitting: Nurse Practitioner

## 2022-10-24 ENCOUNTER — Ambulatory Visit (HOSPITAL_BASED_OUTPATIENT_CLINIC_OR_DEPARTMENT_OTHER)
Admission: RE | Admit: 2022-10-24 | Discharge: 2022-10-24 | Disposition: A | Discharge status: Home / Self Care | Payer: BC Managed Care – PPO | Source: Ambulatory Visit | Attending: Nurse Practitioner | Admitting: Nurse Practitioner

## 2022-10-24 VITALS — BP 120/76 | HR 58 | Temp 98.2°F | Resp 18 | Ht 67.0 in | Wt 200.2 lb

## 2022-10-24 DIAGNOSIS — K769 Liver disease, unspecified: Secondary | ICD-10-CM | POA: Diagnosis not present

## 2022-10-24 DIAGNOSIS — C250 Malignant neoplasm of head of pancreas: Secondary | ICD-10-CM

## 2022-10-24 DIAGNOSIS — N6321 Unspecified lump in the left breast, upper outer quadrant: Secondary | ICD-10-CM | POA: Diagnosis not present

## 2022-10-24 DIAGNOSIS — Z79899 Other long term (current) drug therapy: Secondary | ICD-10-CM | POA: Diagnosis not present

## 2022-10-24 DIAGNOSIS — I82622 Acute embolism and thrombosis of deep veins of left upper extremity: Secondary | ICD-10-CM

## 2022-10-24 DIAGNOSIS — C7801 Secondary malignant neoplasm of right lung: Secondary | ICD-10-CM | POA: Diagnosis not present

## 2022-10-24 DIAGNOSIS — I10 Essential (primary) hypertension: Secondary | ICD-10-CM | POA: Diagnosis not present

## 2022-10-24 DIAGNOSIS — E1140 Type 2 diabetes mellitus with diabetic neuropathy, unspecified: Secondary | ICD-10-CM | POA: Diagnosis not present

## 2022-10-24 DIAGNOSIS — I82612 Acute embolism and thrombosis of superficial veins of left upper extremity: Secondary | ICD-10-CM | POA: Diagnosis not present

## 2022-10-24 DIAGNOSIS — C7802 Secondary malignant neoplasm of left lung: Secondary | ICD-10-CM | POA: Diagnosis not present

## 2022-10-24 DIAGNOSIS — Z5111 Encounter for antineoplastic chemotherapy: Secondary | ICD-10-CM | POA: Diagnosis not present

## 2022-10-24 LAB — CBC WITH DIFFERENTIAL (CANCER CENTER ONLY)
Abs Immature Granulocytes: 0.03 10*3/uL (ref 0.00–0.07)
Basophils Absolute: 0 10*3/uL (ref 0.0–0.1)
Basophils Relative: 1 %
Eosinophils Absolute: 0.1 10*3/uL (ref 0.0–0.5)
Eosinophils Relative: 2 %
HCT: 27.2 % — ABNORMAL LOW (ref 36.0–46.0)
Hemoglobin: 8.4 g/dL — ABNORMAL LOW (ref 12.0–15.0)
Immature Granulocytes: 1 %
Lymphocytes Relative: 13 %
Lymphs Abs: 0.8 10*3/uL (ref 0.7–4.0)
MCH: 29.5 pg (ref 26.0–34.0)
MCHC: 30.9 g/dL (ref 30.0–36.0)
MCV: 95.4 fL (ref 80.0–100.0)
Monocytes Absolute: 0.9 10*3/uL (ref 0.1–1.0)
Monocytes Relative: 14 %
Neutro Abs: 4.2 10*3/uL (ref 1.7–7.7)
Neutrophils Relative %: 69 %
Platelet Count: 502 10*3/uL — ABNORMAL HIGH (ref 150–400)
RBC: 2.85 MIL/uL — ABNORMAL LOW (ref 3.87–5.11)
RDW: 14.6 % (ref 11.5–15.5)
WBC Count: 6.1 10*3/uL (ref 4.0–10.5)
nRBC: 0 % (ref 0.0–0.2)

## 2022-10-24 LAB — MAGNESIUM: Magnesium: 1.5 mg/dL — ABNORMAL LOW (ref 1.7–2.4)

## 2022-10-24 LAB — CMP (CANCER CENTER ONLY)
ALT: 13 U/L (ref 0–44)
AST: 12 U/L — ABNORMAL LOW (ref 15–41)
Albumin: 3.3 g/dL — ABNORMAL LOW (ref 3.5–5.0)
Alkaline Phosphatase: 148 U/L — ABNORMAL HIGH (ref 38–126)
Anion gap: 5 (ref 5–15)
BUN: 7 mg/dL (ref 6–20)
CO2: 24 mmol/L (ref 22–32)
Calcium: 8.4 mg/dL — ABNORMAL LOW (ref 8.9–10.3)
Chloride: 108 mmol/L (ref 98–111)
Creatinine: 1.03 mg/dL — ABNORMAL HIGH (ref 0.44–1.00)
GFR, Estimated: >60 mL/min (ref >60)
Glucose, Bld: 218 mg/dL — ABNORMAL HIGH (ref 70–99)
Potassium: 3.7 mmol/L (ref 3.5–5.1)
Sodium: 137 mmol/L (ref 135–145)
Total Bilirubin: 0.5 mg/dL (ref 0.3–1.2)
Total Protein: 6.8 g/dL (ref 6.5–8.1)

## 2022-10-24 MED ORDER — APIXABAN (ELIQUIS) VTE STARTER PACK (10MG AND 5MG)
ORAL_TABLET | ORAL | 0 refills | Status: DC
Start: 2022-10-24 — End: 2022-10-24

## 2022-10-24 MED ORDER — HEPARIN SOD (PORK) LOCK FLUSH 100 UNIT/ML IV SOLN
500.0000 [IU] | Freq: Once | INTRAVENOUS | Status: AC
  Administered 2022-10-24: 500 [IU] via INTRAVENOUS

## 2022-10-24 MED ORDER — SODIUM CHLORIDE 0.9% FLUSH
10.0000 mL | INTRAVENOUS | Status: AC | PRN
  Administered 2022-10-24: 10 mL via INTRAVENOUS

## 2022-10-24 MED ORDER — APIXABAN (ELIQUIS) VTE STARTER PACK (10MG AND 5MG)
ORAL_TABLET | ORAL | 0 refills | Status: DC
Start: 2022-10-24 — End: 2022-11-07

## 2022-10-24 NOTE — Progress Notes (Signed)
Patient seen by Lonna Cobb NP today  Vitals are within treatment parameters.  Labs reviewed by Lonna Cobb NP and are not all within treatment parameters. Mag 1.5.  Per physician team, patient will not be receiving treatment today.

## 2022-10-24 NOTE — Addendum Note (Signed)
Addended by: Dimitri Ped on: 10/24/2022 01:16 PM   Modules accepted: Orders

## 2022-10-24 NOTE — Progress Notes (Signed)
[facility] OFFICE PROGRESS NOTE   Diagnosis: Pancreas cancer  INTERVAL HISTORY:   [name] returns as scheduled.  She completed another cycle of gemcitabine/Abraxane 10/10/2022.  She denies nausea/vomiting.  No mouth sores.  No diarrhea.  No fever or rash after treatment.  Stable neuropathy symptoms.  Good appetite.  About 2 weeks ago she noticed left shoulder and arm discomfort, swelling.  Veins intermittently look "big" especially after showering.  No chest pain or shortness of breath.  She does not feel her neck is swollen.  She denies bleeding.  Port-A-Cath is on the left side.  Objective:  Vital signs in last 24 hours:  Blood pressure 120/76, pulse (!) 58, temperature 98.2 F (36.8 C), temperature source Oral, resp. rate 18, height 5\' 7"  (1.702 m), weight 200 lb 3.2 oz (90.8 kg), last menstrual period 09/17/2012, SpO2 100 %.    HEENT: No thrush or ulcers. Resp: Lungs clear bilaterally. Cardio: Regular rate and rhythm. GI: Abdomen soft and nontender.  No hepatosplenomegaly. Vascular: No leg edema.  Entire left arm is edematous, vein engorgement.  Left chest wall vein engorgement.  No significant neck edema. Neuro: Alert and oriented. Port-A-Cath without erythema.  Lab Results:  Lab Results  Component Value Date   WBC 6.1 10/24/2022   HGB 8.4 (L) 10/24/2022   HCT 27.2 (L) 10/24/2022   MCV 95.4 10/24/2022   PLT 502 (H) 10/24/2022   NEUTROABS 4.2 10/24/2022    Imaging:  No results found.  Medications: I have reviewed the patient's current medications.  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 Tumor invades the duodenal wall and vascular groove, resection margins negative, 4/34 lymph nodes positive MSI-stable, tumor showed instability in 2 loci as did adjacent normal tissue Foundation 1-K-ras G12 V, microsatellite status and tumor mutation burden cannot be determined EUS FNA biopsy of pancreas  mass on 07/03/2018-well-differentiated neuroendocrine tumor CTs 01/29/2019-ill-defined pancreas head mass, 5 pulmonary nodules-1 with a small amount of central cavitation, tumor abuts the left margin of the portal vein indistinct density surrounding, hepatic artery, complex cystic lesion of the right kidney, right adrenal mass-characterized as an adenoma on a [facility] MRI 12/21/2018 Netspot 03/03/2019-no focal pancreas activity, no tracer accumulation within the suspicious pulmonary nodules, left uterine mass with tracer accumulation felt to represent a leiomyoma Elevated preoperative CA 19-9--CA 19-9 186 on 01/18/2019 CT chest 04/16/2019-multiple bilateral pulmonary nodules, some with increased cavitation, stable in size Cycle 1 FOLFIRINOX 04/27/2019 Cycle 2 FOLFIRINOX 05/11/2019 Cycle 3 FOLFIRINOX 05/23/2019 Cycle 4 FOLFIRINOX 06/08/2019 Cycle 5 FOLFIRINOX 06/22/2019 CT chest 07/02/2019-stable size of bilateral pulmonary nodules.  Dominant cavitary lesions in both lungs show increased cavitation with thinner walls.  Stable 2.1 cm right adrenal nodule. 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 No definite evidence of metastatic disease in the abdomen or pelvis. Markedly enlarged and heterogeneous appearing uterus, likely to represent multifocal fibroids. 1 of these lesions appears to be an exophytic subserosal fibroid in the posterior lateral aspect of the uterine body on the left side although this comes in close proximity to the left adnexa such that a primary ovarian lesion is difficult to  completely exclude. CTs 02/07/2020-slight  enlargement of bilateral lung nodules, some are cavitary, no evidence of metastatic disease in the abdomen or pelvis, stable right adrenal nodule, uterine fibroids CTs 04/26/2020-mild enlargement of pulmonary nodules, slight increase in trace pelvic fluid, new soft tissue thickening inferior to the cecal tip suspicious for peritoneal metastasis CT 05/26/2020-improved appearance of soft tissue at the inferior tip of the cecum, mildly thickened short appendix-findings suggestive of resolving appendicitis, stable small bibasilar pulmonary nodules, fibroids Plan biopsy of right cecal tip soft tissue canceled secondary to radiologic improvement CT chest 08/02/2020-enlargement and progressive cavitation of multiple bilateral lung nodules.  Some new nodules are present. CTs 10/24/2020- increase in size of pulmonary nodules, no new nodules, no evidence of metastatic disease in the abdomen, stable right adrenal nodule CT 01/09/2021-slight interval enlargement of pulmonary nodules, stable right adrenal nodule Navigation bronchoscopy 01/30/2021-left lower lobe cavitary nodule FNA-adenocarcinoma, brushing-adenocarcinoma.  Left lower lobe lavage-adenocarcinoma.  Right upper lobe brushing and FNA biopsy of cavitary nodule-adenocarcinoma-immunohistochemical profile consistent with pancreas adenocarcinoma Cycle 1 gemcitabine/Abraxane 03/28/2021 Cycle 2 gemcitabine/Abraxane 04/11/2021 Cycle 3 gemcitabine/Abraxane 04/25/2021 Cycle 4 gemcitabine/Abraxane 05/09/2021 Cycle 5 gemcitabine/Abraxane 05/23/2021 CT chest 06/05/2021-interval cavitation of multiple pulmonary nodules, some nodules have decreased in size, no new or enlarging nodules Cycle 6 gemcitabine/Abraxane 06/06/2021 Cycle 7 gemcitabine/Abraxane 06/21/2021 Cycle 8 gemcitabine/Abraxane 07/05/2021 Cycle 9 Gemcitabine/Abraxane 07/19/2021 Cycle 10 gemcitabine 08/01/2021-Abraxane held secondary to neuropathy CT chest 08/13/2021-mild  decrease in size and wall thickness of multiple cavitary nodules, no new or progressive disease in the chest, indeterminate low-attenuation right liver lesions Cycle 11 gemcitabine 08/16/2021-Abraxane held secondary to neuropathy Cycle 12 gemcitabine 08/30/2021-Abraxane held secondary to neuropathy Cycle 13 gemcitabine 09/13/2021-Abraxane held secondary to neuropathy Cycle 14 gemcitabine 09/27/2021-Abraxane held secondary to neuropathy Cycle 15 gemcitabine 10/11/2021-Abraxane held secondary to neuropathy CTs 10/22/2021-no change in multiple cavitary lung nodules, no evidence of disease progression, ill-defined hypodense lesion in the posterior right liver suspicious for metastatic disease Cycle 16 gemcitabine 10/25/2021 Cycle 17 gemcitabine 11/08/2021 Cycle 18 Gemcitabine 11/22/2021 Cycle 19 gemcitabine 12/06/2021 Cycle 20 Gemcitabine 12/20/2021 CT 12/31/2021-mild increase in size of bilateral pulmonary metastases, stable subtle continuation right liver lesions Cycle 20 gemcitabine/Abraxane 01/03/2022 Cycle 21 gemcitabine/Abraxane 01/17/2022 Cycle 22 gemcitabine/Abraxane 01/31/2022 Cycle 23 gemcitabine/Abraxane 02/14/2022 Cycle 24 gemcitabine/Abraxane 02/28/2022 CTs 03/11/2022-widespread metastatic disease to the lungs again noted with slight involution of several of the pulmonary nodules, no definite new nodules noted; interval cavitation of solid lesion in the posterior aspect right lobe of the liver, no new liver lesions noted. Cycle 25 Gemcitabine/Abraxane 03/14/2022 Cycle 26 gemcitabine/Abraxane 03/28/2022 Cycle 27 gemcitabine/Abraxane 04/25/2022 Cycle 28 gemcitabine/Abraxane 05/09/2022 Cycle 29 Gemcitabine 05/23/2022, Abraxane held due to neuropathy CTs 06/04/2022-stable multifocal cavitary pulmonary nodules.  No definite new nodules.  No new nodules greater than a centimeter.  Decreased size of the right posterior hepatic lobe lesion.  Generalized heterogeneity and new focal areas of hypodensity in the  liver. Cycle 30 Gemcitabine 06/06/2022, Abraxane held due to neuropathy Cycle 31 gemcitabine 06/20/2022, Abraxane held due to neuropathy Cycle 32 gemcitabine 07/04/2022, Abraxane held due to neuropathy Cycle 33 gemcitabine  07/18/2022 ,Abraxane held due to neuropathy Cycle 34 gemcitabine 08/15/2022, Abraxane held due to neuropathy Cycle 35 gemcitabine 08/29/2022, Abraxane held due to neuropathy CTs 09/10/2022-enlarging pulmonary metastases.  Subtle poorly defined hypoattenuating lesions in the liver, less well-seen than on 06/04/2022.  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 appears stable.  She completed a cycle of gemcitabine/Abraxane 2 weeks ago.  Since then she has noticed left arm pain and swelling.  On exam today the left arm is edematous with vein engorgement involving the arm as well as the left chest wall.  Her Port-A-Cath is located on the left side.  She is being referred for a stat venous Doppler to evaluate for a left upper extremity DVT.    Lonna Cobb ANP/GNP-BC   10/24/2022  11:07 AM  Addendum 1:06 PM-venous Doppler confirms acute DVT extending from the brachial veins through the left subclavian vein; superficial venous thrombosis involving left basilic vein.  I reviewed the results with Ms.  Alyssa Ross.  We discussed the risks and benefits associated with anticoagulation.  She will begin an Eliquis starter pack.  She will contact the office if symptoms do not improve over the next week or so.  We decided to hold today's chemotherapy.  She will return as scheduled in 2 weeks.

## 2022-10-24 NOTE — Telephone Encounter (Signed)
The pharmacy currently does not have the initial pack of Eliquis available for the patient. Instead, an electronic prescription has been sent to [facility] on [address] for fulfillment. The patient has been informed of this change.

## 2022-10-25 ENCOUNTER — Other Ambulatory Visit: Payer: Self-pay

## 2022-10-26 LAB — CANCER ANTIGEN 19-9: CA 19-9: 215 U/mL — ABNORMAL HIGH (ref 0–35)

## 2022-11-03 ENCOUNTER — Other Ambulatory Visit: Payer: Self-pay | Admitting: Oncology

## 2022-11-07 ENCOUNTER — Encounter: Payer: Self-pay | Admitting: *Deleted

## 2022-11-07 ENCOUNTER — Inpatient Hospital Stay: Payer: BC Managed Care – PPO

## 2022-11-07 ENCOUNTER — Other Ambulatory Visit: Payer: Self-pay | Admitting: *Deleted

## 2022-11-07 ENCOUNTER — Inpatient Hospital Stay: Payer: BC Managed Care – PPO | Admitting: Oncology

## 2022-11-07 VITALS — BP 109/66 | HR 70 | Temp 98.2°F | Resp 18 | Wt 196.8 lb

## 2022-11-07 VITALS — BP 121/77 | HR 60 | Resp 18

## 2022-11-07 DIAGNOSIS — C250 Malignant neoplasm of head of pancreas: Secondary | ICD-10-CM

## 2022-11-07 DIAGNOSIS — E1140 Type 2 diabetes mellitus with diabetic neuropathy, unspecified: Secondary | ICD-10-CM | POA: Diagnosis not present

## 2022-11-07 DIAGNOSIS — C7802 Secondary malignant neoplasm of left lung: Secondary | ICD-10-CM | POA: Diagnosis not present

## 2022-11-07 DIAGNOSIS — Z5111 Encounter for antineoplastic chemotherapy: Secondary | ICD-10-CM | POA: Diagnosis not present

## 2022-11-07 DIAGNOSIS — K769 Liver disease, unspecified: Secondary | ICD-10-CM | POA: Diagnosis not present

## 2022-11-07 DIAGNOSIS — C7801 Secondary malignant neoplasm of right lung: Secondary | ICD-10-CM | POA: Diagnosis not present

## 2022-11-07 DIAGNOSIS — N6321 Unspecified lump in the left breast, upper outer quadrant: Secondary | ICD-10-CM | POA: Diagnosis not present

## 2022-11-07 DIAGNOSIS — Z79899 Other long term (current) drug therapy: Secondary | ICD-10-CM | POA: Diagnosis not present

## 2022-11-07 DIAGNOSIS — I10 Essential (primary) hypertension: Secondary | ICD-10-CM | POA: Diagnosis not present

## 2022-11-07 LAB — CMP (CANCER CENTER ONLY)
ALT: 17 U/L (ref 0–44)
AST: 19 U/L (ref 15–41)
Albumin: 3.4 g/dL — ABNORMAL LOW (ref 3.5–5.0)
Alkaline Phosphatase: 117 U/L (ref 38–126)
Anion gap: 5 (ref 5–15)
BUN: 9 mg/dL (ref 6–20)
CO2: 25 mmol/L (ref 22–32)
Calcium: 8.5 mg/dL — ABNORMAL LOW (ref 8.9–10.3)
Chloride: 104 mmol/L (ref 98–111)
Creatinine: 1.15 mg/dL — ABNORMAL HIGH (ref 0.44–1.00)
GFR, Estimated: 56 mL/min — ABNORMAL LOW (ref >60)
Glucose, Bld: 304 mg/dL — ABNORMAL HIGH (ref 70–99)
Potassium: 3.9 mmol/L (ref 3.5–5.1)
Sodium: 134 mmol/L — ABNORMAL LOW (ref 135–145)
Total Bilirubin: 0.5 mg/dL (ref 0.3–1.2)
Total Protein: 7 g/dL (ref 6.5–8.1)

## 2022-11-07 LAB — CBC WITH DIFFERENTIAL (CANCER CENTER ONLY)
Abs Immature Granulocytes: 0.01 10*3/uL (ref 0.00–0.07)
Basophils Absolute: 0.1 10*3/uL (ref 0.0–0.1)
Basophils Relative: 1 %
Eosinophils Absolute: 0.2 10*3/uL (ref 0.0–0.5)
Eosinophils Relative: 4 %
HCT: 28.9 % — ABNORMAL LOW (ref 36.0–46.0)
Hemoglobin: 9.1 g/dL — ABNORMAL LOW (ref 12.0–15.0)
Immature Granulocytes: 0 %
Lymphocytes Relative: 22 %
Lymphs Abs: 1 10*3/uL (ref 0.7–4.0)
MCH: 30.1 pg (ref 26.0–34.0)
MCHC: 31.5 g/dL (ref 30.0–36.0)
MCV: 95.7 fL (ref 80.0–100.0)
Monocytes Absolute: 0.4 10*3/uL (ref 0.1–1.0)
Monocytes Relative: 9 %
Neutro Abs: 2.9 10*3/uL (ref 1.7–7.7)
Neutrophils Relative %: 64 %
Platelet Count: 310 10*3/uL (ref 150–400)
RBC: 3.02 MIL/uL — ABNORMAL LOW (ref 3.87–5.11)
RDW: 15.1 % (ref 11.5–15.5)
WBC Count: 4.6 10*3/uL (ref 4.0–10.5)
nRBC: 0 % (ref 0.0–0.2)

## 2022-11-07 LAB — MAGNESIUM: Magnesium: 1.6 mg/dL — ABNORMAL LOW (ref 1.7–2.4)

## 2022-11-07 MED ORDER — SODIUM CHLORIDE 0.9 % IV SOLN
2000.0000 mg | Freq: Once | INTRAVENOUS | Status: AC
  Administered 2022-11-07: 2000 mg via INTRAVENOUS
  Filled 2022-11-07: qty 52.6

## 2022-11-07 MED ORDER — SODIUM CHLORIDE 0.9% FLUSH
10.0000 mL | INTRAVENOUS | Status: DC | PRN
  Administered 2022-11-07: 10 mL

## 2022-11-07 MED ORDER — APIXABAN 2.5 MG PO TABS: 2.5000 mg | ORAL_TABLET | Freq: Two times a day (BID) | ORAL | 5 refills | Status: DC

## 2022-11-07 MED ORDER — SODIUM CHLORIDE 0.9 % IV SOLN: Freq: Once | INTRAVENOUS | Status: AC

## 2022-11-07 MED ORDER — HEPARIN SOD (PORK) LOCK FLUSH 100 UNIT/ML IV SOLN
500.0000 [IU] | Freq: Once | INTRAVENOUS | Status: AC | PRN
  Administered 2022-11-07: 500 [IU]

## 2022-11-07 MED ORDER — PROCHLORPERAZINE MALEATE 10 MG PO TABS
10.0000 mg | ORAL_TABLET | Freq: Once | ORAL | Status: AC
  Administered 2022-11-07: 10 mg via ORAL
  Filled 2022-11-07: qty 1

## 2022-11-07 MED ORDER — PACLITAXEL PROTEIN-BOUND CHEMO INJECTION 100 MG
100.0000 mg/m2 | Freq: Once | INTRAVENOUS | Status: AC
  Administered 2022-11-07: 200 mg via INTRAVENOUS
  Filled 2022-11-07: qty 40

## 2022-11-07 NOTE — Progress Notes (Signed)
Patient seen by Dr. Truett Perna today  Vitals are within treatment parameters.  Labs reviewed by Dr. Truett Perna and are within treatment parameters.No intervention for Mg+ 1.6  Per physician team, patient is ready for treatment and there are NO modifications to the treatment plan.

## 2022-11-07 NOTE — Progress Notes (Signed)
[facility]   Diagnosis: Pancreas cancer  INTERVAL HISTORY:   [name] returns as scheduled.  She was diagnosed with a left upper extremity Port-A-Cath related DVT when she was here on 10/24/2022.  She was started on apixaban.  She reports the left arm discomfort and edema have improved significantly.  No bleeding. She reports a mild increase in foot neuropathy symptoms.  Objective:  Vital signs in last 24 hours:  Blood pressure 109/66, pulse 70, temperature 98.2 F (36.8 C), temperature source Oral, resp. rate 18, weight 196 lb 12.8 oz (89.3 kg), last menstrual period 09/17/2012, SpO2 100 %.    HEENT: No thrush Resp: Lungs clear bilaterally Cardio: Regular rate and rhythm GI: Nontender, no hepatosplenomegaly, no mass Vascular: No leg edema, trace-1+ edema throughout the left arm with venous engorgement over the left arm and chest wall.  No erythema or palpable cord  Skin: Acne type rash over the face  Portacath/PICC-without erythema  Lab Results:  Lab Results  Component Value Date   WBC 4.6 11/07/2022   HGB 9.1 (L) 11/07/2022   HCT 28.9 (L) 11/07/2022   MCV 95.7 11/07/2022   PLT 310 11/07/2022   NEUTROABS 2.9 11/07/2022    CMP  Lab Results  Component Value Date   NA 137 10/24/2022   K 3.7 10/24/2022   CL 108 10/24/2022   CO2 24 10/24/2022   GLUCOSE 218 (H) 10/24/2022   BUN 7 10/24/2022   CREATININE 1.03 (H) 10/24/2022   CALCIUM 8.4 (L) 10/24/2022   PROT 6.8 10/24/2022   ALBUMIN 3.3 (L) 10/24/2022   AST 12 (L) 10/24/2022   ALT 13 10/24/2022   ALKPHOS 148 (H) 10/24/2022   BILITOT 0.5 10/24/2022   GFRNONAA >60 10/24/2022   GFRAA >60 02/07/2020    Lab Results  Component Value Date   UXL244 215 (H) 10/24/2022    Medications: I have reviewed the patient's current medications.   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 Tumor invades the duodenal wall and  vascular groove, resection margins negative, 4/34 lymph nodes positive MSI-stable, tumor showed instability in 2 loci as did adjacent normal tissue Foundation 1-K-ras G12 V, microsatellite status and tumor mutation burden cannot be determined EUS FNA biopsy of pancreas mass on 07/03/2018-well-differentiated neuroendocrine tumor CTs 01/29/2019-ill-defined pancreas head mass, 5 pulmonary nodules-1 with a small amount of central cavitation, tumor abuts the left margin of the portal vein indistinct density surrounding, hepatic artery, complex cystic lesion of the right kidney, right adrenal mass-characterized as an adenoma on a [facility] MRI 12/21/2018 Netspot 03/03/2019-no focal pancreas activity, no tracer accumulation within the suspicious pulmonary nodules, left uterine mass with tracer accumulation felt to represent a leiomyoma Elevated preoperative CA 19-9--CA 19-9 186 on 01/18/2019 CT chest 04/16/2019-multiple bilateral pulmonary nodules, some with increased cavitation, stable in size Cycle 1 FOLFIRINOX 04/27/2019 Cycle 2 FOLFIRINOX 05/11/2019 Cycle 3 FOLFIRINOX 05/23/2019 Cycle 4 FOLFIRINOX 06/08/2019 Cycle 5 FOLFIRINOX 06/22/2019 CT chest 07/02/2019-stable size of bilateral pulmonary nodules.  Dominant cavitary lesions in both lungs show increased cavitation with thinner walls.  Stable 2.1 cm right adrenal nodule. 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 No  definite evidence of metastatic disease in the abdomen or pelvis. Markedly enlarged and heterogeneous appearing uterus, likely to  represent multifocal fibroids. 1 of these lesions appears to be an exophytic subserosal fibroid in the posterior lateral aspect of the uterine body on the left side although this comes in close proximity to the left adnexa such that a primary ovarian lesion is difficult to completely exclude. CTs 02/07/2020-slight enlargement of bilateral lung nodules, some are cavitary, no evidence of metastatic disease in the abdomen or pelvis, stable right adrenal nodule, uterine fibroids CTs 04/26/2020-mild enlargement of pulmonary nodules, slight increase in trace pelvic fluid, new soft tissue thickening inferior to the cecal tip suspicious for peritoneal metastasis CT 05/26/2020-improved appearance of soft tissue at the inferior tip of the cecum, mildly thickened short appendix-findings suggestive of resolving appendicitis, stable small bibasilar pulmonary nodules, fibroids Plan biopsy of right cecal tip soft tissue canceled secondary to radiologic improvement CT chest 08/02/2020-enlargement and progressive cavitation of multiple bilateral lung nodules.  Some new nodules are present. CTs 10/24/2020- increase in size of pulmonary nodules, no new nodules, no evidence of metastatic disease in the abdomen, stable right adrenal nodule CT 01/09/2021-slight interval enlargement of pulmonary nodules, stable right adrenal nodule Navigation bronchoscopy 01/30/2021-left lower lobe cavitary nodule FNA-adenocarcinoma, brushing-adenocarcinoma.  Left lower lobe lavage-adenocarcinoma.  Right upper lobe brushing and FNA biopsy of cavitary nodule-adenocarcinoma-immunohistochemical profile consistent with pancreas adenocarcinoma Cycle 1 gemcitabine/Abraxane 03/28/2021 Cycle 2 gemcitabine/Abraxane 04/11/2021 Cycle 3 gemcitabine/Abraxane 04/25/2021 Cycle 4 gemcitabine/Abraxane 05/09/2021 Cycle 5 gemcitabine/Abraxane 05/23/2021 CT chest 06/05/2021-interval cavitation of multiple pulmonary nodules, some nodules have decreased in size, no new  or enlarging nodules Cycle 6 gemcitabine/Abraxane 06/06/2021 Cycle 7 gemcitabine/Abraxane 06/21/2021 Cycle 8 gemcitabine/Abraxane 07/05/2021 Cycle 9 Gemcitabine/Abraxane 07/19/2021 Cycle 10 gemcitabine 08/01/2021-Abraxane held secondary to neuropathy CT chest 08/13/2021-mild decrease in size and wall thickness of multiple cavitary nodules, no new or progressive disease in the chest, indeterminate low-attenuation right liver lesions Cycle 11 gemcitabine 08/16/2021-Abraxane held secondary to neuropathy Cycle 12 gemcitabine 08/30/2021-Abraxane held secondary to neuropathy Cycle 13 gemcitabine 09/13/2021-Abraxane held secondary to neuropathy Cycle 14 gemcitabine 09/27/2021-Abraxane held secondary to neuropathy Cycle 15 gemcitabine 10/11/2021-Abraxane held secondary to neuropathy CTs 10/22/2021-no change in multiple cavitary lung nodules, no evidence of disease progression, ill-defined hypodense lesion in the posterior right liver suspicious for metastatic disease Cycle 16 gemcitabine 10/25/2021 Cycle 17 gemcitabine 11/08/2021 Cycle 18 Gemcitabine 11/22/2021 Cycle 19 gemcitabine 12/06/2021 Cycle 20 Gemcitabine 12/20/2021 CT 12/31/2021-mild increase in size of bilateral pulmonary metastases, stable subtle continuation right liver lesions Cycle 20 gemcitabine/Abraxane 01/03/2022 Cycle 21 gemcitabine/Abraxane 01/17/2022 Cycle 22 gemcitabine/Abraxane 01/31/2022 Cycle 23 gemcitabine/Abraxane 02/14/2022 Cycle 24 gemcitabine/Abraxane 02/28/2022 CTs 03/11/2022-widespread metastatic disease to the lungs again noted with slight involution of several of the pulmonary nodules, no definite new nodules noted; interval cavitation of solid lesion in the posterior aspect right lobe of the liver, no new liver lesions noted. Cycle 25 Gemcitabine/Abraxane 03/14/2022 Cycle 26 gemcitabine/Abraxane 03/28/2022 Cycle 27 gemcitabine/Abraxane 04/25/2022 Cycle 28 gemcitabine/Abraxane 05/09/2022 Cycle 29 Gemcitabine 05/23/2022, Abraxane held due to  neuropathy CTs 06/04/2022-stable multifocal cavitary pulmonary nodules.  No definite new nodules.  No new nodules greater than a centimeter.  Decreased size of the right posterior hepatic lobe lesion.  Generalized heterogeneity and new focal areas of hypodensity in the liver. Cycle 30 Gemcitabine 06/06/2022, Abraxane held due to neuropathy Cycle 31 gemcitabine 06/20/2022, Abraxane held due to neuropathy Cycle 32 gemcitabine 07/04/2022, Abraxane held due to neuropathy Cycle 33 gemcitabine  07/18/2022 ,Abraxane held due to neuropathy Cycle 34 gemcitabine 08/15/2022, Abraxane  held due to neuropathy Cycle 35 gemcitabine 08/29/2022, Abraxane held due to neuropathy CTs 09/10/2022-enlarging pulmonary metastases.  Subtle poorly defined hypoattenuating lesions in the liver, less well-seen than on 06/04/2022.  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  Apixaban 10/24/2022    Disposition: [name] appears stable.  Chemotherapy was held when she was here 2 weeks ago secondary to a new diagnosis of a left upper  extremity DVT.  She is taking apixaban.  Arm edema and discomfort have improved.  She will continue apixaban.  The plan is to resume gemcitabine/Abraxane today.  The CA 19-9 has been higher over the past few months.  The plan is to continue gemcitabine/Abraxane for several more cycles prior to a restaging evaluation.  We discussed referral for the immunotherapy at the [facility] and a pan KRAS inhibitor trial at [facility].  Thornton Papas, MD  11/07/2022  10:13 AM

## 2022-11-07 NOTE — Patient Instructions (Addendum)
[place] [facility]   Discharge Instructions: Thank you for choosing [facility] to provide your oncology and hematology care.   If you have a lab appointment with the Cancer Center, please go directly to the Cancer Center and check in at the registration area.   Wear comfortable clothing and clothing appropriate for easy access to any Portacath or PICC line.   We strive to give you quality time with your provider. You may need to reschedule your appointment if you arrive late (15 or more minutes).  Arriving late affects you and other patients whose appointments are after yours.  Also, if you miss three or more appointments without notifying the office, you may be dismissed from the clinic at the provider's discretion.      For prescription refill requests, have your pharmacy contact our office and allow 72 hours for refills to be completed.    Today you received the following chemotherapy and/or immunotherapy agents Paclitaxel-protein bound (ABRAXANE) & Gemcitabine (GEMZAR).   To help prevent nausea and vomiting after your treatment, we encourage you to take your nausea medication as directed.  BELOW ARE SYMPTOMS THAT SHOULD BE REPORTED IMMEDIATELY: *FEVER GREATER THAN 100.4 F (38 C) OR HIGHER *CHILLS OR SWEATING *NAUSEA AND VOMITING THAT IS NOT CONTROLLED WITH YOUR NAUSEA MEDICATION *UNUSUAL SHORTNESS OF BREATH *UNUSUAL BRUISING OR BLEEDING *URINARY PROBLEMS (pain or burning when urinating, or frequent urination) *BOWEL PROBLEMS (unusual diarrhea, constipation, pain near the anus) TENDERNESS IN MOUTH AND THROAT WITH OR WITHOUT PRESENCE OF ULCERS (sore throat, sores in mouth, or a toothache) UNUSUAL RASH, SWELLING OR PAIN  UNUSUAL VAGINAL DISCHARGE OR ITCHING   Items with * indicate a potential emergency and should be followed up as soon as possible or go to the Emergency Department if any problems should occur.  Please show the CHEMOTHERAPY  ALERT CARD or IMMUNOTHERAPY ALERT CARD at check-in to the Emergency Department and triage nurse.  Should you have questions after your visit or need to cancel or reschedule your appointment, please contact [place] [facility]  Dept: [phone]  and follow the prompts.  Office hours are 8:00 a.m. to 4:30 p.m. Monday - Friday. Please note that voicemails left after 4:00 p.m. may not be returned until the following business day.  We are closed weekends and major holidays. You have access to a nurse at all times for urgent questions. Please call the main number to the clinic Dept: [phone] and follow the prompts.   For any non-urgent questions, you may also contact your provider using MyChart. We now offer e-Visits for anyone 35 and older to request care online for non-urgent symptoms. For details visit [email or web address].   Also download the MyChart app! Go to the app store, search "MyChart", open the app, select [place], and log in with your MyChart username and password.  Paclitaxel Nanoparticle Albumin-Bound Injection What is this medication? NANOPARTICLE ALBUMIN-BOUND PACLITAXEL (Na no PAHR ti kuhl al BYOO muhn-bound PAK li TAX el) treats some types of cancer. It works by slowing down the growth of cancer cells. This medicine may be used for other purposes; ask your health care provider or pharmacist if you have questions. COMMON BRAND NAME(S): Abraxane What should I tell my care team before I take this medication? They need to know if you have any of these conditions: Liver disease Low white blood cell levels An unusual or allergic reaction to paclitaxel, albumin, other medications, foods, dyes, or preservatives  If you or your partner are pregnant or trying to get pregnant Breast-feeding How should I use this medication? This medication is injected into a vein. It is given by your care team in a hospital or clinic setting. Talk to your care team  about the use of this medication in children. Special care may be needed. Overdosage: If you think you have taken too much of this medicine contact a poison control center or emergency room at once. NOTE: This medicine is only for you. Do not share this medicine with others. What if I miss a dose? Keep appointments for follow-up doses. It is important not to miss your dose. Call your care team if you are unable to keep an appointment. What may interact with this medication? Other medications may affect the way this medication works. Talk with your care team about all of the medications you take. They may suggest changes to your treatment plan to lower the risk of side effects and to make sure your medications work as intended. This list may not describe all possible interactions. Give your health care provider a list of all the medicines, herbs, non-prescription drugs, or dietary supplements you use. Also tell them if you smoke, drink alcohol, or use illegal drugs. Some items may interact with your medicine. What should I watch for while using this medication? Your condition will be monitored carefully while you are receiving this medication. You may need blood work while taking this medication. This medication may make you feel generally unwell. This is not uncommon as chemotherapy can affect healthy cells as well as cancer cells. Report any side effects. Continue your course of treatment even though you feel ill unless your care team tells you to stop. This medication can cause serious allergic reactions. To reduce the risk, your care team may give you other medications to take before receiving this one. Be sure to follow the directions from your care team. This medication may increase your risk of getting an infection. Call your care team for advice if you get a fever, chills, sore throat, or other symptoms of a cold or flu. Do not treat yourself. Try to avoid being around people who are sick. This  medication may increase your risk to bruise or bleed. Call your care team if you notice any unusual bleeding. Be careful brushing or flossing your teeth or using a toothpick because you may get an infection or bleed more easily. If you have any dental work done, tell your dentist you are receiving this medication. Talk to your care team if you or your partner may be pregnant. Serious birth defects can occur if you take this medication during pregnancy and for 6 months after the last dose. You will need a negative pregnancy test before starting this medication. Contraception is recommended while taking this medication and for 6 months after the last dose. Your care team can help you find the option that works for you. If your partner can get pregnant, use a condom during sex while taking this medication and for 3 months after the last dose. Do not breastfeed while taking this medication and for 2 weeks after the last dose. This medication may cause infertility. Talk to your care team if you are concerned about your fertility. What side effects may I notice from receiving this medication? Side effects that you should report to your care team as soon as possible: Allergic reactions--skin rash, itching, hives, swelling of the face, lips, tongue, or throat Dry cough,  shortness of breath or trouble breathing Infection--fever, chills, cough, sore throat, wounds that don't heal, pain or trouble when passing urine, general feeling of discomfort or being unwell Low red blood cell level--unusual weakness or fatigue, dizziness, headache, trouble breathing Pain, tingling, or numbness in the hands or feet Stomach pain, unusual weakness or fatigue, nausea, vomiting, diarrhea, or fever that lasts longer than expected Unusual bruising or bleeding Side effects that usually do not require medical attention (report to your care team if they continue or are bothersome): Diarrhea Fatigue Hair loss Loss of  appetite Nausea Vomiting This list may not describe all possible side effects. Call your doctor for medical advice about side effects. You may report side effects to FDA at 1-800-FDA-1088. Where should I keep my medication? This medication is given in a hospital or clinic. It will not be stored at home. NOTE: This sheet is a summary. It may not cover all possible information. If you have questions about this medicine, talk to your doctor, pharmacist, or health care provider.  2024 Elsevier/Gold Standard (2021-10-11 00:00:00)  Gemcitabine Injection What is this medication? GEMCITABINE (jem SYE ta been) treats some types of cancer. It works by slowing down the growth of cancer cells. This medicine may be used for other purposes; ask your health care provider or pharmacist if you have questions. COMMON BRAND NAME(S): Gemzar, Infugem What should I tell my care team before I take this medication? They need to know if you have any of these conditions: Blood disorders Infection Kidney disease Liver disease Lung or breathing disease, such as asthma or COPD Recent or ongoing radiation therapy An unusual or allergic reaction to gemcitabine, other medications, foods, dyes, or preservatives If you or your partner are pregnant or trying to get pregnant Breast-feeding How should I use this medication? This medication is injected into a vein. It is given by your care team in a hospital or clinic setting. Talk to your care team about the use of this medication in children. Special care may be needed. Overdosage: If you think you have taken too much of this medicine contact a poison control center or emergency room at once. NOTE: This medicine is only for you. Do not share this medicine with others. What if I miss a dose? Keep appointments for follow-up doses. It is important not to miss your dose. Call your care team if you are unable to keep an appointment. What may interact with this  medication? Interactions have not been studied. This list may not describe all possible interactions. Give your health care provider a list of all the medicines, herbs, non-prescription drugs, or dietary supplements you use. Also tell them if you smoke, drink alcohol, or use illegal drugs. Some items may interact with your medicine. What should I watch for while using this medication? Your condition will be monitored carefully while you are receiving this medication. This medication may make you feel generally unwell. This is not uncommon, as chemotherapy can affect healthy cells as well as cancer cells. Report any side effects. Continue your course of treatment even though you feel ill unless your care team tells you to stop. In some cases, you may be given additional medications to help with side effects. Follow all directions for their use. This medication may increase your risk of getting an infection. Call your care team for advice if you get a fever, chills, sore throat, or other symptoms of a cold or flu. Do not treat yourself. Try to avoid being around  people who are sick. This medication may increase your risk to bruise or bleed. Call your care team if you notice any unusual bleeding. Be careful brushing or flossing your teeth or using a toothpick because you may get an infection or bleed more easily. If you have any dental work done, tell your dentist you are receiving this medication. Avoid taking medications that contain aspirin, acetaminophen, ibuprofen, naproxen, or ketoprofen unless instructed by your care team. These medications may hide a fever. Talk to your care team if you or your partner wish to become pregnant or think you might be pregnant. This medication can cause serious birth defects if taken during pregnancy and for 6 months after the last dose. A negative pregnancy test is required before starting this medication. A reliable form of contraception is recommended while taking  this medication and for 6 months after the last dose. Talk to your care team about effective forms of contraception. Do not father a child while taking this medication and for 3 months after the last dose. Use a condom while having sex during this time period. Do not breastfeed while taking this medication and for at least 1 week after the last dose. This medication may cause infertility. Talk to your care team if you are concerned about your fertility. What side effects may I notice from receiving this medication? Side effects that you should report to your care team as soon as possible: Allergic reactions--skin rash, itching, hives, swelling of the face, lips, tongue, or throat Capillary leak syndrome--stomach or muscle pain, unusual weakness or fatigue, feeling faint or lightheaded, decrease in the amount of urine, swelling of the ankles, hands, or feet, trouble breathing Infection--fever, chills, cough, sore throat, wounds that don't heal, pain or trouble when passing urine, general feeling of discomfort or being unwell Liver injury--right upper belly pain, loss of appetite, nausea, light-colored stool, dark yellow or brown urine, yellowing skin or eyes, unusual weakness or fatigue Low red blood cell level--unusual weakness or fatigue, dizziness, headache, trouble breathing Lung injury--shortness of breath or trouble breathing, cough, spitting up blood, chest pain, fever Stomach pain, bloody diarrhea, pale skin, unusual weakness or fatigue, decrease in the amount of urine, which may be signs of hemolytic uremic syndrome Sudden and severe headache, confusion, change in vision, seizures, which may be signs of posterior reversible encephalopathy syndrome (PRES) Unusual bruising or bleeding Side effects that usually do not require medical attention (report to your care team if they continue or are bothersome): Diarrhea Drowsiness Hair loss Nausea Pain, redness, or swelling with sores inside the  mouth or throat Vomiting This list may not describe all possible side effects. Call your doctor for medical advice about side effects. You may report side effects to FDA at 1-800-FDA-1088. Where should I keep my medication? This medication is given in a hospital or clinic. It will not be stored at home. NOTE: This sheet is a summary. It may not cover all possible information. If you have questions about this medicine, talk to your doctor, pharmacist, or health care provider.  2024 Elsevier/Gold Standard (2021-10-02 00:00:00)

## 2022-11-09 LAB — CANCER ANTIGEN 19-9: CA 19-9: 172 U/mL — ABNORMAL HIGH (ref 0–35)

## 2022-11-11 ENCOUNTER — Other Ambulatory Visit: Payer: Self-pay | Admitting: *Deleted

## 2022-11-11 ENCOUNTER — Encounter: Payer: Self-pay | Admitting: Oncology

## 2022-11-11 MED ORDER — APIXABAN 5 MG PO TABS: 5.0000 mg | ORAL_TABLET | Freq: Two times a day (BID) | ORAL | 3 refills | Status: DC

## 2022-11-13 ENCOUNTER — Other Ambulatory Visit: Payer: Self-pay

## 2022-11-16 ENCOUNTER — Other Ambulatory Visit: Payer: Self-pay

## 2022-11-21 ENCOUNTER — Inpatient Hospital Stay (HOSPITAL_BASED_OUTPATIENT_CLINIC_OR_DEPARTMENT_OTHER): Payer: BC Managed Care – PPO | Admitting: Nurse Practitioner

## 2022-11-21 ENCOUNTER — Inpatient Hospital Stay: Payer: BC Managed Care – PPO

## 2022-11-21 ENCOUNTER — Inpatient Hospital Stay: Payer: BC Managed Care – PPO | Attending: Genetic Counselor

## 2022-11-21 ENCOUNTER — Encounter: Payer: Self-pay | Admitting: Nurse Practitioner

## 2022-11-21 VITALS — BP 122/76 | HR 60 | Temp 98.1°F | Resp 18 | Ht 67.0 in | Wt 203.0 lb

## 2022-11-21 VITALS — BP 112/70 | HR 53 | Resp 18

## 2022-11-21 DIAGNOSIS — Z79899 Other long term (current) drug therapy: Secondary | ICD-10-CM | POA: Diagnosis not present

## 2022-11-21 DIAGNOSIS — C7802 Secondary malignant neoplasm of left lung: Secondary | ICD-10-CM | POA: Insufficient documentation

## 2022-11-21 DIAGNOSIS — C250 Malignant neoplasm of head of pancreas: Secondary | ICD-10-CM

## 2022-11-21 DIAGNOSIS — C7801 Secondary malignant neoplasm of right lung: Secondary | ICD-10-CM | POA: Insufficient documentation

## 2022-11-21 DIAGNOSIS — Z905 Acquired absence of kidney: Secondary | ICD-10-CM | POA: Insufficient documentation

## 2022-11-21 DIAGNOSIS — C3411 Malignant neoplasm of upper lobe, right bronchus or lung: Secondary | ICD-10-CM | POA: Insufficient documentation

## 2022-11-21 DIAGNOSIS — Z85528 Personal history of other malignant neoplasm of kidney: Secondary | ICD-10-CM | POA: Insufficient documentation

## 2022-11-21 DIAGNOSIS — Z5111 Encounter for antineoplastic chemotherapy: Secondary | ICD-10-CM | POA: Insufficient documentation

## 2022-11-21 LAB — CMP (CANCER CENTER ONLY)
ALT: 21 U/L (ref 0–44)
AST: 20 U/L (ref 15–41)
Albumin: 3.4 g/dL — ABNORMAL LOW (ref 3.5–5.0)
Alkaline Phosphatase: 129 U/L — ABNORMAL HIGH (ref 38–126)
Anion gap: 5 (ref 5–15)
BUN: 21 mg/dL — ABNORMAL HIGH (ref 6–20)
CO2: 25 mmol/L (ref 22–32)
Calcium: 8.4 mg/dL — ABNORMAL LOW (ref 8.9–10.3)
Chloride: 107 mmol/L (ref 98–111)
Creatinine: 1.03 mg/dL — ABNORMAL HIGH (ref 0.44–1.00)
GFR, Estimated: >60 mL/min (ref >60)
Glucose, Bld: 169 mg/dL — ABNORMAL HIGH (ref 70–99)
Potassium: 3.6 mmol/L (ref 3.5–5.1)
Sodium: 137 mmol/L (ref 135–145)
Total Bilirubin: 0.4 mg/dL (ref 0.3–1.2)
Total Protein: 6.8 g/dL (ref 6.5–8.1)

## 2022-11-21 LAB — CBC WITH DIFFERENTIAL (CANCER CENTER ONLY)
Abs Immature Granulocytes: 0.02 10*3/uL (ref 0.00–0.07)
Basophils Absolute: 0 10*3/uL (ref 0.0–0.1)
Basophils Relative: 0 %
Eosinophils Absolute: 0.2 10*3/uL (ref 0.0–0.5)
Eosinophils Relative: 4 %
HCT: 27.3 % — ABNORMAL LOW (ref 36.0–46.0)
Hemoglobin: 8.5 g/dL — ABNORMAL LOW (ref 12.0–15.0)
Immature Granulocytes: 0 %
Lymphocytes Relative: 21 %
Lymphs Abs: 1.1 10*3/uL (ref 0.7–4.0)
MCH: 29.5 pg (ref 26.0–34.0)
MCHC: 31.1 g/dL (ref 30.0–36.0)
MCV: 94.8 fL (ref 80.0–100.0)
Monocytes Absolute: 0.6 10*3/uL (ref 0.1–1.0)
Monocytes Relative: 13 %
Neutro Abs: 3.1 10*3/uL (ref 1.7–7.7)
Neutrophils Relative %: 62 %
Platelet Count: 434 10*3/uL — ABNORMAL HIGH (ref 150–400)
RBC: 2.88 MIL/uL — ABNORMAL LOW (ref 3.87–5.11)
RDW: 14.8 % (ref 11.5–15.5)
WBC Count: 5 10*3/uL (ref 4.0–10.5)
nRBC: 0 % (ref 0.0–0.2)

## 2022-11-21 LAB — MAGNESIUM: Magnesium: 1.6 mg/dL — ABNORMAL LOW (ref 1.7–2.4)

## 2022-11-21 MED ORDER — SODIUM CHLORIDE 0.9 % IV SOLN: Freq: Once | INTRAVENOUS | Status: AC

## 2022-11-21 MED ORDER — PROCHLORPERAZINE MALEATE 10 MG PO TABS
10.0000 mg | ORAL_TABLET | Freq: Once | ORAL | Status: AC
  Administered 2022-11-21: 10 mg via ORAL
  Filled 2022-11-21: qty 1

## 2022-11-21 MED ORDER — HEPARIN SOD (PORK) LOCK FLUSH 100 UNIT/ML IV SOLN
500.0000 [IU] | Freq: Once | INTRAVENOUS | Status: AC | PRN
  Administered 2022-11-21: 500 [IU]

## 2022-11-21 MED ORDER — SODIUM CHLORIDE 0.9% FLUSH
10.0000 mL | INTRAVENOUS | Status: DC | PRN
  Administered 2022-11-21: 10 mL

## 2022-11-21 MED ORDER — PACLITAXEL PROTEIN-BOUND CHEMO INJECTION 100 MG
100.0000 mg/m2 | Freq: Once | INTRAVENOUS | Status: AC
  Administered 2022-11-21: 200 mg via INTRAVENOUS
  Filled 2022-11-21: qty 40

## 2022-11-21 MED ORDER — SODIUM CHLORIDE 0.9 % IV SOLN
2000.0000 mg | Freq: Once | INTRAVENOUS | Status: AC
  Administered 2022-11-21: 2000 mg via INTRAVENOUS
  Filled 2022-11-21: qty 52.6

## 2022-11-21 NOTE — Progress Notes (Addendum)
Patient seen by Lonna Cobb NP today  Vitals are within treatment parameters.  Labs reviewed by Lonna Cobb NP and are not all within treatment parameters. Mag 1.6 no intervention   Per physician team, patient is ready for treatment and there are NO modifications to the treatment plan.

## 2022-11-21 NOTE — Patient Instructions (Signed)
[facility]   Discharge Instructions: Thank you for choosing [facility] to provide your oncology and hematology care.   If you have a lab appointment with the Cancer Center, please go directly to the Cancer Center and check in at the registration area.   Wear comfortable clothing and clothing appropriate for easy access to any Portacath or PICC line.   We strive to give you quality time with your provider. You may need to reschedule your appointment if you arrive late (15 or more minutes).  Arriving late affects you and other patients whose appointments are after yours.  Also, if you miss three or more appointments without notifying the office, you may be dismissed from the clinic at the provider's discretion.      For prescription refill requests, have your pharmacy contact our office and allow 72 hours for refills to be completed.    Today you received the following chemotherapy and/or immunotherapy agents Abraxane, Gemzar.      To help prevent nausea and vomiting after your treatment, we encourage you to take your nausea medication as directed.  BELOW ARE SYMPTOMS THAT SHOULD BE REPORTED IMMEDIATELY: *FEVER GREATER THAN 100.4 F (38 C) OR HIGHER *CHILLS OR SWEATING *NAUSEA AND VOMITING THAT IS NOT CONTROLLED WITH YOUR NAUSEA MEDICATION *UNUSUAL SHORTNESS OF BREATH *UNUSUAL BRUISING OR BLEEDING *URINARY PROBLEMS (pain or burning when urinating, or frequent urination) *BOWEL PROBLEMS (unusual diarrhea, constipation, pain near the anus) TENDERNESS IN MOUTH AND THROAT WITH OR WITHOUT PRESENCE OF ULCERS (sore throat, sores in mouth, or a toothache) UNUSUAL RASH, SWELLING OR PAIN  UNUSUAL VAGINAL DISCHARGE OR ITCHING   Items with * indicate a potential emergency and should be followed up as soon as possible or go to the Emergency Department if any problems should occur.  Please show the CHEMOTHERAPY ALERT CARD or IMMUNOTHERAPY ALERT CARD  at check-in to the Emergency Department and triage nurse.  Should you have questions after your visit or need to cancel or reschedule your appointment, please contact [facility]  Dept: [phone]  and follow the prompts.  Office hours are 8:00 a.m. to 4:30 p.m. Monday - Friday. Please note that voicemails left after 4:00 p.m. may not be returned until the following business day.  We are closed weekends and major holidays. You have access to a nurse at all times for urgent questions. Please call the main number to the clinic Dept: [phone] and follow the prompts.   For any non-urgent questions, you may also contact your provider using MyChart. We now offer e-Visits for anyone 4 and older to request care online for non-urgent symptoms. For details visit [email or web address].   Also download the MyChart app! Go to the app store, search "MyChart", open the app, select [place], and log in with your MyChart username and password.  Paclitaxel Nanoparticle Albumin-Bound Injection What is this medication? NANOPARTICLE ALBUMIN-BOUND PACLITAXEL (Na no PAHR ti kuhl al BYOO muhn-bound PAK li TAX el) treats some types of cancer. It works by slowing down the growth of cancer cells. This medicine may be used for other purposes; ask your health care provider or pharmacist if you have questions. COMMON BRAND NAME(S): Abraxane What should I tell my care team before I take this medication? They need to know if you have any of these conditions: Liver disease Low white blood cell levels An unusual or allergic reaction to paclitaxel, albumin, other medications, foods, dyes, or preservatives If  you or your partner are pregnant or trying to get pregnant Breast-feeding How should I use this medication? This medication is injected into a vein. It is given by your care team in a hospital or clinic setting. Talk to your care team about the use of this medication in  children. Special care may be needed. Overdosage: If you think you have taken too much of this medicine contact a poison control center or emergency room at once. NOTE: This medicine is only for you. Do not share this medicine with others. What if I miss a dose? Keep appointments for follow-up doses. It is important not to miss your dose. Call your care team if you are unable to keep an appointment. What may interact with this medication? Other medications may affect the way this medication works. Talk with your care team about all of the medications you take. They may suggest changes to your treatment plan to lower the risk of side effects and to make sure your medications work as intended. This list may not describe all possible interactions. Give your health care provider a list of all the medicines, herbs, non-prescription drugs, or dietary supplements you use. Also tell them if you smoke, drink alcohol, or use illegal drugs. Some items may interact with your medicine. What should I watch for while using this medication? Your condition will be monitored carefully while you are receiving this medication. You may need blood work while taking this medication. This medication may make you feel generally unwell. This is not uncommon as chemotherapy can affect healthy cells as well as cancer cells. Report any side effects. Continue your course of treatment even though you feel ill unless your care team tells you to stop. This medication can cause serious allergic reactions. To reduce the risk, your care team may give you other medications to take before receiving this one. Be sure to follow the directions from your care team. This medication may increase your risk of getting an infection. Call your care team for advice if you get a fever, chills, sore throat, or other symptoms of a cold or flu. Do not treat yourself. Try to avoid being around people who are sick. This medication may increase your risk to  bruise or bleed. Call your care team if you notice any unusual bleeding. Be careful brushing or flossing your teeth or using a toothpick because you may get an infection or bleed more easily. If you have any dental work done, tell your dentist you are receiving this medication. Talk to your care team if you or your partner may be pregnant. Serious birth defects can occur if you take this medication during pregnancy and for 6 months after the last dose. You will need a negative pregnancy test before starting this medication. Contraception is recommended while taking this medication and for 6 months after the last dose. Your care team can help you find the option that works for you. If your partner can get pregnant, use a condom during sex while taking this medication and for 3 months after the last dose. Do not breastfeed while taking this medication and for 2 weeks after the last dose. This medication may cause infertility. Talk to your care team if you are concerned about your fertility. What side effects may I notice from receiving this medication? Side effects that you should report to your care team as soon as possible: Allergic reactions--skin rash, itching, hives, swelling of the face, lips, tongue, or throat Dry cough, shortness   of breath or trouble breathing Infection--fever, chills, cough, sore throat, wounds that don't heal, pain or trouble when passing urine, general feeling of discomfort or being unwell Low red blood cell level--unusual weakness or fatigue, dizziness, headache, trouble breathing Pain, tingling, or numbness in the hands or feet Stomach pain, unusual weakness or fatigue, nausea, vomiting, diarrhea, or fever that lasts longer than expected Unusual bruising or bleeding Side effects that usually do not require medical attention (report to your care team if they continue or are bothersome): Diarrhea Fatigue Hair loss Loss of appetite Nausea Vomiting This list may not  describe all possible side effects. Call your doctor for medical advice about side effects. You may report side effects to FDA at 1-800-FDA-1088. Where should I keep my medication? This medication is given in a hospital or clinic. It will not be stored at home. NOTE: This sheet is a summary. It may not cover all possible information. If you have questions about this medicine, talk to your doctor, pharmacist, or health care provider.  2024 Elsevier/Gold Standard (2021-10-11 00:00:00)  Gemcitabine Injection What is this medication? GEMCITABINE (jem SYE ta been) treats some types of cancer. It works by slowing down the growth of cancer cells. This medicine may be used for other purposes; ask your health care provider or pharmacist if you have questions. COMMON BRAND NAME(S): Gemzar, Infugem What should I tell my care team before I take this medication? They need to know if you have any of these conditions: Blood disorders Infection Kidney disease Liver disease Lung or breathing disease, such as asthma or COPD Recent or ongoing radiation therapy An unusual or allergic reaction to gemcitabine, other medications, foods, dyes, or preservatives If you or your partner are pregnant or trying to get pregnant Breast-feeding How should I use this medication? This medication is injected into a vein. It is given by your care team in a hospital or clinic setting. Talk to your care team about the use of this medication in children. Special care may be needed. Overdosage: If you think you have taken too much of this medicine contact a poison control center or emergency room at once. NOTE: This medicine is only for you. Do not share this medicine with others. What if I miss a dose? Keep appointments for follow-up doses. It is important not to miss your dose. Call your care team if you are unable to keep an appointment. What may interact with this medication? Interactions have not been studied. This list  may not describe all possible interactions. Give your health care provider a list of all the medicines, herbs, non-prescription drugs, or dietary supplements you use. Also tell them if you smoke, drink alcohol, or use illegal drugs. Some items may interact with your medicine. What should I watch for while using this medication? Your condition will be monitored carefully while you are receiving this medication. This medication may make you feel generally unwell. This is not uncommon, as chemotherapy can affect healthy cells as well as cancer cells. Report any side effects. Continue your course of treatment even though you feel ill unless your care team tells you to stop. In some cases, you may be given additional medications to help with side effects. Follow all directions for their use. This medication may increase your risk of getting an infection. Call your care team for advice if you get a fever, chills, sore throat, or other symptoms of a cold or flu. Do not treat yourself. Try to avoid being around people  who are sick. This medication may increase your risk to bruise or bleed. Call your care team if you notice any unusual bleeding. Be careful brushing or flossing your teeth or using a toothpick because you may get an infection or bleed more easily. If you have any dental work done, tell your dentist you are receiving this medication. Avoid taking medications that contain aspirin, acetaminophen, ibuprofen, naproxen, or ketoprofen unless instructed by your care team. These medications may hide a fever. Talk to your care team if you or your partner wish to become pregnant or think you might be pregnant. This medication can cause serious birth defects if taken during pregnancy and for 6 months after the last dose. A negative pregnancy test is required before starting this medication. A reliable form of contraception is recommended while taking this medication and for 6 months after the last dose. Talk to  your care team about effective forms of contraception. Do not father a child while taking this medication and for 3 months after the last dose. Use a condom while having sex during this time period. Do not breastfeed while taking this medication and for at least 1 week after the last dose. This medication may cause infertility. Talk to your care team if you are concerned about your fertility. What side effects may I notice from receiving this medication? Side effects that you should report to your care team as soon as possible: Allergic reactions--skin rash, itching, hives, swelling of the face, lips, tongue, or throat Capillary leak syndrome--stomach or muscle pain, unusual weakness or fatigue, feeling faint or lightheaded, decrease in the amount of urine, swelling of the ankles, hands, or feet, trouble breathing Infection--fever, chills, cough, sore throat, wounds that don't heal, pain or trouble when passing urine, general feeling of discomfort or being unwell Liver injury--right upper belly pain, loss of appetite, nausea, light-colored stool, dark yellow or brown urine, yellowing skin or eyes, unusual weakness or fatigue Low red blood cell level--unusual weakness or fatigue, dizziness, headache, trouble breathing Lung injury--shortness of breath or trouble breathing, cough, spitting up blood, chest pain, fever Stomach pain, bloody diarrhea, pale skin, unusual weakness or fatigue, decrease in the amount of urine, which may be signs of hemolytic uremic syndrome Sudden and severe headache, confusion, change in vision, seizures, which may be signs of posterior reversible encephalopathy syndrome (PRES) Unusual bruising or bleeding Side effects that usually do not require medical attention (report to your care team if they continue or are bothersome): Diarrhea Drowsiness Hair loss Nausea Pain, redness, or swelling with sores inside the mouth or throat Vomiting This list may not describe all  possible side effects. Call your doctor for medical advice about side effects. You may report side effects to FDA at 1-800-FDA-1088. Where should I keep my medication? This medication is given in a hospital or clinic. It will not be stored at home. NOTE: This sheet is a summary. It may not cover all possible information. If you have questions about this medicine, talk to your doctor, pharmacist, or health care provider.  2024 Elsevier/Gold Standard (2021-10-02 00:00:00)

## 2022-11-21 NOTE — Progress Notes (Signed)
[facility] OFFICE PROGRESS NOTE   Diagnosis: Pancreas cancer  INTERVAL HISTORY:   [name] returns as scheduled.  She completed another cycle of gemcitabine/Abraxane 11/07/2022.  She denies nausea/vomiting.  No diarrhea.  No fever or rash after treatment.  Stable neuropathy symptoms.  Left arm with continued improvement in swelling and pain.  Objective:  Vital signs in last 24 hours:  Blood pressure 122/76, pulse 60, temperature 98.1 F (36.7 C), temperature source Oral, resp. rate 18, height 5\' 7"  (1.702 m), weight 203 lb (92.1 kg), last menstrual period 09/17/2012, SpO2 100 %.    HEENT: Mild white coating over tongue.  No buccal thrush.  No ulcers. Resp: Lungs clear bilaterally. Cardio: Regular rate and rhythm. GI: No hepatosplenomegaly. Vascular: No leg edema.  Trace edema left arm.  Mild venous engorgement left upper arm, chest wall. Port-A-Cath without erythema.  Lab Results:  Lab Results  Component Value Date   WBC 5.0 11/21/2022   HGB 8.5 (L) 11/21/2022   HCT 27.3 (L) 11/21/2022   MCV 94.8 11/21/2022   PLT 434 (H) 11/21/2022   NEUTROABS 3.1 11/21/2022    Imaging:  No results found.  Medications: I have reviewed the patient's current medications.  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 Tumor invades the duodenal wall and vascular groove, resection margins negative, 4/34 lymph nodes positive MSI-stable, tumor showed instability in 2 loci as did adjacent normal tissue Foundation 1-K-ras G12 V, microsatellite status and tumor mutation burden cannot be determined EUS FNA biopsy of pancreas mass on 07/03/2018-well-differentiated neuroendocrine tumor CTs 01/29/2019-ill-defined pancreas head mass, 5 pulmonary nodules-1 with a small amount of central cavitation, tumor abuts the left margin of the portal vein indistinct density surrounding, hepatic artery, complex cystic lesion of the right  kidney, right adrenal mass-characterized as an adenoma on a [facility] MRI 12/21/2018 [facility] 03/03/2019-no focal pancreas activity, no tracer accumulation within the suspicious pulmonary nodules, left uterine mass with tracer accumulation felt to represent a leiomyoma Elevated preoperative CA 19-9--CA 19-9 186 on 01/18/2019 CT chest 04/16/2019-multiple bilateral pulmonary nodules, some with increased cavitation, stable in size Cycle 1 FOLFIRINOX 04/27/2019 Cycle 2 FOLFIRINOX 05/11/2019 Cycle 3 FOLFIRINOX 05/23/2019 Cycle 4 FOLFIRINOX 06/08/2019 Cycle 5 FOLFIRINOX 06/22/2019 CT chest 07/02/2019-stable size of bilateral pulmonary nodules.  Dominant cavitary lesions in both lungs show increased cavitation with thinner walls.  Stable 2.1 cm right adrenal nodule. 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 No definite evidence of metastatic disease in the abdomen or pelvis. Markedly enlarged and heterogeneous appearing uterus, likely to represent multifocal fibroids. 1 of these lesions appears to be an exophytic subserosal fibroid in the posterior lateral aspect of the uterine body on the left side although this comes in close proximity to the left adnexa such that a primary ovarian lesion is difficult to completely exclude. CTs 02/07/2020-slight enlargement of bilateral lung nodules, some are cavitary, no evidence of metastatic disease in the abdomen or pelvis, stable right adrenal nodule, uterine fibroids CTs 04/26/2020-mild enlargement of pulmonary nodules, slight increase in trace pelvic fluid, new  soft tissue thickening inferior to the cecal tip suspicious for peritoneal metastasis CT 05/26/2020-improved appearance of soft tissue  at the inferior tip of the cecum, mildly thickened short appendix-findings suggestive of resolving appendicitis, stable small bibasilar pulmonary nodules, fibroids Plan biopsy of right cecal tip soft tissue canceled secondary to radiologic improvement CT chest 08/02/2020-enlargement and progressive cavitation of multiple bilateral lung nodules.  Some new nodules are present. CTs 10/24/2020- increase in size of pulmonary nodules, no new nodules, no evidence of metastatic disease in the abdomen, stable right adrenal nodule CT 01/09/2021-slight interval enlargement of pulmonary nodules, stable right adrenal nodule Navigation bronchoscopy 01/30/2021-left lower lobe cavitary nodule FNA-adenocarcinoma, brushing-adenocarcinoma.  Left lower lobe lavage-adenocarcinoma.  Right upper lobe brushing and FNA biopsy of cavitary nodule-adenocarcinoma-immunohistochemical profile consistent with pancreas adenocarcinoma Cycle 1 gemcitabine/Abraxane 03/28/2021 Cycle 2 gemcitabine/Abraxane 04/11/2021 Cycle 3 gemcitabine/Abraxane 04/25/2021 Cycle 4 gemcitabine/Abraxane 05/09/2021 Cycle 5 gemcitabine/Abraxane 05/23/2021 CT chest 06/05/2021-interval cavitation of multiple pulmonary nodules, some nodules have decreased in size, no new or enlarging nodules Cycle 6 gemcitabine/Abraxane 06/06/2021 Cycle 7 gemcitabine/Abraxane 06/21/2021 Cycle 8 gemcitabine/Abraxane 07/05/2021 Cycle 9 Gemcitabine/Abraxane 07/19/2021 Cycle 10 gemcitabine 08/01/2021-Abraxane held secondary to neuropathy CT chest 08/13/2021-mild decrease in size and wall thickness of multiple cavitary nodules, no new or progressive disease in the chest, indeterminate low-attenuation right liver lesions Cycle 11 gemcitabine 08/16/2021-Abraxane held secondary to neuropathy Cycle 12 gemcitabine 08/30/2021-Abraxane held secondary to  neuropathy Cycle 13 gemcitabine 09/13/2021-Abraxane held secondary to neuropathy Cycle 14 gemcitabine 09/27/2021-Abraxane held secondary to neuropathy Cycle 15 gemcitabine 10/11/2021-Abraxane held secondary to neuropathy CTs 10/22/2021-no change in multiple cavitary lung nodules, no evidence of disease progression, ill-defined hypodense lesion in the posterior right liver suspicious for metastatic disease Cycle 16 gemcitabine 10/25/2021 Cycle 17 gemcitabine 11/08/2021 Cycle 18 Gemcitabine 11/22/2021 Cycle 19 gemcitabine 12/06/2021 Cycle 20 Gemcitabine 12/20/2021 CT 12/31/2021-mild increase in size of bilateral pulmonary metastases, stable subtle continuation right liver lesions Cycle 20 gemcitabine/Abraxane 01/03/2022 Cycle 21 gemcitabine/Abraxane 01/17/2022 Cycle 22 gemcitabine/Abraxane 01/31/2022 Cycle 23 gemcitabine/Abraxane 02/14/2022 Cycle 24 gemcitabine/Abraxane 02/28/2022 CTs 03/11/2022-widespread metastatic disease to the lungs again noted with slight involution of several of the pulmonary nodules, no definite new nodules noted; interval cavitation of solid lesion in the posterior aspect right lobe of the liver, no new liver lesions noted. Cycle 25 Gemcitabine/Abraxane 03/14/2022 Cycle 26 gemcitabine/Abraxane 03/28/2022 Cycle 27 gemcitabine/Abraxane 04/25/2022 Cycle 28 gemcitabine/Abraxane 05/09/2022 Cycle 29 Gemcitabine 05/23/2022, Abraxane held due to neuropathy CTs 06/04/2022-stable multifocal cavitary pulmonary nodules.  No definite new nodules.  No new nodules greater than a centimeter.  Decreased size of the right posterior hepatic lobe lesion.  Generalized heterogeneity and new focal areas of hypodensity in the liver. Cycle 30 Gemcitabine 06/06/2022, Abraxane held due to neuropathy Cycle 31 gemcitabine 06/20/2022, Abraxane held due to neuropathy Cycle 32 gemcitabine 07/04/2022, Abraxane held due to neuropathy Cycle 33 gemcitabine  07/18/2022 ,Abraxane held due to neuropathy Cycle 34 gemcitabine  08/15/2022, Abraxane held due to neuropathy Cycle 35 gemcitabine 08/29/2022, Abraxane held due to neuropathy CTs 09/10/2022-enlarging pulmonary metastases.  Subtle poorly defined hypoattenuating lesions in the liver, less well-seen than on 06/04/2022.  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  Apixaban 10/24/2022    Disposition: [name] appears stable.  She continues Gemcitabine/Abraxane every 2 weeks.  There is no clinical evidence of disease progression.  CA 19-9 from 2 weeks ago was lower.  Plan to proceed with treatment today as scheduled.  CBC and chemistry panel reviewed.  Labs adequate for treatment.  She has persistent low magnesium, mild today.  We discussed IV magnesium, increasing oral magnesium.  She declines IV and will continue current dose of magnesium.  She will return for follow-up in 2 weeks.    Lonna Cobb ANP/GNP-BC   11/21/2022  10:12 AM

## 2022-11-22 LAB — CANCER ANTIGEN 19-9: CA 19-9: 136 U/mL — ABNORMAL HIGH (ref 0–35)

## 2022-11-26 ENCOUNTER — Ambulatory Visit: Payer: BC Managed Care – PPO | Admitting: Dermatology

## 2022-11-26 VITALS — BP 126/71

## 2022-11-26 DIAGNOSIS — L810 Postinflammatory hyperpigmentation: Secondary | ICD-10-CM

## 2022-11-26 DIAGNOSIS — L700 Acne vulgaris: Secondary | ICD-10-CM | POA: Diagnosis not present

## 2022-11-26 MED ORDER — DOXYCYCLINE HYCLATE 100 MG PO TABS: 100.0000 mg | ORAL_TABLET | Freq: Every day | ORAL | 0 refills | Status: DC

## 2022-11-26 NOTE — Progress Notes (Unsigned)
Follow-Up Visit   Subjective  Alyssa Ross is a 57 y.o. female who presents for the following: acne follow up, much improved. Sensitive skin. Thinks secondary to chemo. Pt has pancreatic cancer and has been On chemo for almost 2 years, every 2 weeks.   Current regimen:  Taking Doxycycline 100 mg once daily for 5-6 months. Using Metronidazole 0.75% cream once  daily, OTC Effaclar and Vichy at bedtime. Has tretinoin 0.05% cream but has not started due to concern of irritation. Using CeraVe products.   The following portions of the chart were reviewed this encounter and updated as appropriate: medications, allergies, medical history  Review of Systems:  No other skin or systemic complaints except as noted in HPI or Assessment and Plan.  Objective  Well appearing patient in no apparent distress; mood and affect are within normal limits.  A focused examination was performed of the following areas: Face, scalp and neck  Relevant exam findings are noted in the Assessment and Plan.    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 Condition is bothersome/symptomatic for patient. Currently flared.        Treatment Plan:  -STOP daily doxycycline and only take it PRN flares (100mg  daily for 7 days)  Daily Regimen:  Morning: 1. Wash your face with a gentle cleanser (Cetaphil, CeraVe, Neutrogena) 2. Apply [facility] (TXA/Azeleic Acid/Niacinamide) 3. Follow with a moisturizer and sunscreen suitable for acne-prone skin.Isntree.  Evening: 1. Wash your face with a gentle cleanser (Cetaphil, CeraVe, Neutrogena) 2. Apply Vichy with hyaluronic acid to face 3. Apply a pea-sized amount of Effaclar/Adapalene 0.1% to entire face 5 nights a week.   4. Apply a non-comedogenic moisturizer to keep  the skin hydrated overnight (Neutrogena, CeraVe, Cetaphil)  Doxycycline should be taken with food to prevent nausea. Do not lay down for 30 minutes after taking. Be cautious with sun exposure and use good sun protection while on this medication. Pregnant women should not take this medication.    Topical retinoid medications like tretinoin/Retin-A, adapalene/Differin, tazarotene/Fabior, and Epiduo/Epiduo Forte can cause dryness and irritation when first started. Only apply a pea-sized amount to the entire affected area. Avoid applying it around the eyes, edges of mouth and creases at the nose. If you experience irritation, use a good moisturizer first and/or apply the medicine less often. If you are doing well with the medicine, you can increase how often you use it until you are applying every night. Be careful with sun protection while using this medication as it can make you sensitive to the sun. This medicine should not be used by pregnant women.     POST-INFLAMMATORY HYPERPIGMENTATION (PIH) Exam: brown lesions on face and neck in areas of prior acne lesions   This is a benign condition that comes from having previous inflammation in the skin and will fade with time over months to sometimes years. Recommend daily sun protection including sunscreen SPF 30+ to sun-exposed areas. - Recommend treating any itchy or red areas on the skin quickly to prevent new areas of PIH. Treating with prescription medicines such as hydroquinone may help fade dark spots faster.    Treatment Plan: -Wear sunscreen daily -Adding Skin Medicinals (TXA/Azelaic Acid/Niacinamide) to daily regimen qAM   Return in about 3 months (around 02/26/2023) for acne.  Anise Salvo, RMA, am acting  as scribe for Langston Reusing, DO .    Documentation: I have reviewed the above documentation for accuracy and completeness, and I agree with the above.  Langston Reusing, DO

## 2022-11-26 NOTE — Patient Instructions (Addendum)
Instructions for [facility] Medications  One or more of your medications was sent to the [facility] mail order compounding pharmacy. You will receive an email from them and can purchase the medicine through that link. It will then be mailed to your home at the address you confirmed. If for any reason you do not receive an email from them, please check your spam folder. If you still do not find the email, please let us know. [facility] phone number is [phone].  Discontinue doxycycline and restart for 1 week as needed for flares.   Daily Regimen:  Morning: 1. Wash your face with a gentle cleanser (Cetaphil, CeraVe, Neutrogena) 2. Apply Skin Medicinals  3. Follow with a moisturizer and sunscreen suitable for acne-prone skin.Isntree.  Evening: 1. Wash your face with a gentle cleanser (Cetaphil, CeraVe, Neutrogena) 2. Apply Vichy with hyaluronic acid to face 3. Apply a pea-sized amount of Effaclar/Adapalene 0.1% to entire face 5 nights a week.   4. Apply a non-comedogenic moisturizer to keep the skin hydrated overnight (Neutrogena, CeraVe, Cetaphil)  Doxycycline should be taken with food to prevent nausea. Do not lay down for 30 minutes after taking. Be cautious with sun exposure and use good sun protection while on this medication. Pregnant women should not take this medication.   Due to recent changes in healthcare laws, you may see results of your pathology and/or laboratory studies on MyChart before the doctors have had a chance to review them. We understand that in some cases there may be results that are confusing or concerning to you. Please understand that not all results are received at the same time and often the doctors may need to interpret multiple results in order to provide you with the best plan of care or course of treatment. Therefore, we ask that you please give [place] 2 business days to thoroughly review all your results before contacting the office for  clarification. Should we see a critical lab result, you will be contacted sooner.   If You Need Anything After Your Visit  If you have any questions or concerns for your doctor, please call our main line at [phone] If no one answers, please leave a voicemail as directed and we will return your call as soon as possible. Messages left after 4 pm will be answered the following business day.   You may also send [place] a message via MyChart. We typically respond to MyChart messages within 1-2 business days.  For prescription refills, please ask your pharmacy to contact our office. Our fax number is [phone].  If you have an urgent issue when the clinic is closed that cannot wait until the next business day, you can page your doctor at the number below.    Please note that while we do our best to be available for urgent issues outside of office hours, we are not available 24/7.   If you have an urgent issue and are unable to reach [place], you may choose to seek medical care at your doctor's office, retail clinic, urgent care center, or emergency room.  If you have a medical emergency, please immediately call 911 or go to the emergency department. In the event of inclement weather, please call our main line at [phone] for an update on the status of any delays or closures.  Dermatology Medication Tips: Please keep the boxes that topical medications come in in order to help keep track of the instructions about where and how to use these. Pharmacies typically  print the medication instructions only on the boxes and not directly on the medication tubes.   If your medication is too expensive, please contact our office at [phone] or send [place] a message through MyChart.   We are unable to tell what your co-pay for medications will be in advance as this is different depending on your insurance coverage. However, we may be able to find a substitute medication at lower cost or fill out paperwork to  get insurance to cover a needed medication.   If a prior authorization is required to get your medication covered by your insurance company, please allow [place] 1-2 business days to complete this process.  Drug prices often vary depending on where the prescription is filled and some pharmacies may offer cheaper prices.  The website www.goodrx.com contains coupons for medications through different pharmacies. The prices here do not account for what the cost may be with help from insurance (it may be cheaper with your insurance), but the website can give you the price if you did not use any insurance.  - You can print the associated coupon and take it with your prescription to the pharmacy.  - You may also stop by our office during regular business hours and pick up a GoodRx coupon card.  - If you need your prescription sent electronically to a different pharmacy, notify our office through [facility] or by phone at [phone]

## 2022-11-27 ENCOUNTER — Encounter: Payer: Self-pay | Admitting: Dermatology

## 2022-11-28 ENCOUNTER — Other Ambulatory Visit: Payer: Self-pay

## 2022-11-30 ENCOUNTER — Other Ambulatory Visit: Payer: Self-pay | Admitting: Oncology

## 2022-12-02 ENCOUNTER — Other Ambulatory Visit: Payer: Self-pay | Admitting: Internal Medicine

## 2022-12-05 ENCOUNTER — Inpatient Hospital Stay: Payer: BC Managed Care – PPO

## 2022-12-05 ENCOUNTER — Inpatient Hospital Stay: Payer: BC Managed Care – PPO | Admitting: Oncology

## 2022-12-05 VITALS — BP 120/87 | HR 61

## 2022-12-05 VITALS — BP 117/80 | HR 79 | Temp 98.1°F | Resp 18 | Ht 67.0 in | Wt 197.8 lb

## 2022-12-05 DIAGNOSIS — C250 Malignant neoplasm of head of pancreas: Secondary | ICD-10-CM | POA: Diagnosis not present

## 2022-12-05 DIAGNOSIS — C7801 Secondary malignant neoplasm of right lung: Secondary | ICD-10-CM | POA: Diagnosis not present

## 2022-12-05 DIAGNOSIS — C3411 Malignant neoplasm of upper lobe, right bronchus or lung: Secondary | ICD-10-CM | POA: Diagnosis not present

## 2022-12-05 DIAGNOSIS — Z79899 Other long term (current) drug therapy: Secondary | ICD-10-CM | POA: Diagnosis not present

## 2022-12-05 DIAGNOSIS — Z85528 Personal history of other malignant neoplasm of kidney: Secondary | ICD-10-CM | POA: Diagnosis not present

## 2022-12-05 DIAGNOSIS — Z905 Acquired absence of kidney: Secondary | ICD-10-CM | POA: Diagnosis not present

## 2022-12-05 DIAGNOSIS — C7802 Secondary malignant neoplasm of left lung: Secondary | ICD-10-CM | POA: Diagnosis not present

## 2022-12-05 DIAGNOSIS — Z5111 Encounter for antineoplastic chemotherapy: Secondary | ICD-10-CM | POA: Diagnosis not present

## 2022-12-05 LAB — CMP (CANCER CENTER ONLY)
ALT: 22 U/L (ref 0–44)
AST: 18 U/L (ref 15–41)
Albumin: 3.5 g/dL (ref 3.5–5.0)
Alkaline Phosphatase: 177 U/L — ABNORMAL HIGH (ref 38–126)
Anion gap: 6 (ref 5–15)
BUN: 8 mg/dL (ref 6–20)
CO2: 24 mmol/L (ref 22–32)
Calcium: 8.4 mg/dL — ABNORMAL LOW (ref 8.9–10.3)
Chloride: 108 mmol/L (ref 98–111)
Creatinine: 1.05 mg/dL — ABNORMAL HIGH (ref 0.44–1.00)
GFR, Estimated: >60 mL/min (ref >60)
Glucose, Bld: 176 mg/dL — ABNORMAL HIGH (ref 70–99)
Potassium: 3.8 mmol/L (ref 3.5–5.1)
Sodium: 138 mmol/L (ref 135–145)
Total Bilirubin: 0.4 mg/dL (ref 0.3–1.2)
Total Protein: 7 g/dL (ref 6.5–8.1)

## 2022-12-05 LAB — CBC WITH DIFFERENTIAL (CANCER CENTER ONLY)
Abs Immature Granulocytes: 0.01 10*3/uL (ref 0.00–0.07)
Basophils Absolute: 0 10*3/uL (ref 0.0–0.1)
Basophils Relative: 1 %
Eosinophils Absolute: 0.1 10*3/uL (ref 0.0–0.5)
Eosinophils Relative: 2 %
HCT: 28.6 % — ABNORMAL LOW (ref 36.0–46.0)
Hemoglobin: 8.9 g/dL — ABNORMAL LOW (ref 12.0–15.0)
Immature Granulocytes: 0 %
Lymphocytes Relative: 21 %
Lymphs Abs: 1 10*3/uL (ref 0.7–4.0)
MCH: 29.7 pg (ref 26.0–34.0)
MCHC: 31.1 g/dL (ref 30.0–36.0)
MCV: 95.3 fL (ref 80.0–100.0)
Monocytes Absolute: 0.6 10*3/uL (ref 0.1–1.0)
Monocytes Relative: 12 %
Neutro Abs: 2.9 10*3/uL (ref 1.7–7.7)
Neutrophils Relative %: 64 %
Platelet Count: 294 10*3/uL (ref 150–400)
RBC: 3 MIL/uL — ABNORMAL LOW (ref 3.87–5.11)
RDW: 15.4 % (ref 11.5–15.5)
WBC Count: 4.5 10*3/uL (ref 4.0–10.5)
nRBC: 0 % (ref 0.0–0.2)

## 2022-12-05 LAB — MAGNESIUM: Magnesium: 1.7 mg/dL (ref 1.7–2.4)

## 2022-12-05 MED ORDER — SODIUM CHLORIDE 0.9 % IV SOLN: Freq: Once | INTRAVENOUS | Status: AC

## 2022-12-05 MED ORDER — PROCHLORPERAZINE MALEATE 10 MG PO TABS
10.0000 mg | ORAL_TABLET | Freq: Once | ORAL | Status: AC
  Administered 2022-12-05: 10 mg via ORAL
  Filled 2022-12-05: qty 1

## 2022-12-05 MED ORDER — SODIUM CHLORIDE 0.9 % IV SOLN
2000.0000 mg | Freq: Once | INTRAVENOUS | Status: AC
  Administered 2022-12-05: 2000 mg via INTRAVENOUS
  Filled 2022-12-05: qty 52.6

## 2022-12-05 MED ORDER — SODIUM CHLORIDE 0.9% FLUSH
10.0000 mL | INTRAVENOUS | Status: DC | PRN
  Administered 2022-12-05: 10 mL

## 2022-12-05 MED ORDER — HEPARIN SOD (PORK) LOCK FLUSH 100 UNIT/ML IV SOLN
500.0000 [IU] | Freq: Once | INTRAVENOUS | Status: AC | PRN
  Administered 2022-12-05: 500 [IU]

## 2022-12-05 MED ORDER — PACLITAXEL PROTEIN-BOUND CHEMO INJECTION 100 MG
100.0000 mg/m2 | Freq: Once | INTRAVENOUS | Status: AC
  Administered 2022-12-05: 200 mg via INTRAVENOUS
  Filled 2022-12-05: qty 40

## 2022-12-05 NOTE — Patient Instructions (Signed)
[facility]   Discharge Instructions: Thank you for choosing [facility] to provide your oncology and hematology care.   If you have a lab appointment with the Cancer Center, please go directly to the Cancer Center and check in at the registration area.   Wear comfortable clothing and clothing appropriate for easy access to any Portacath or PICC line.   We strive to give you quality time with your provider. You may need to reschedule your appointment if you arrive late (15 or more minutes).  Arriving late affects you and other patients whose appointments are after yours.  Also, if you miss three or more appointments without notifying the office, you may be dismissed from the clinic at the provider's discretion.      For prescription refill requests, have your pharmacy contact our office and allow 72 hours for refills to be completed.    Today you received the following chemotherapy and/or immunotherapy agents Abraxane/Gemzar      To help prevent nausea and vomiting after your treatment, we encourage you to take your nausea medication as directed.  BELOW ARE SYMPTOMS THAT SHOULD BE REPORTED IMMEDIATELY: *FEVER GREATER THAN 100.4 F (38 C) OR HIGHER *CHILLS OR SWEATING *NAUSEA AND VOMITING THAT IS NOT CONTROLLED WITH YOUR NAUSEA MEDICATION *UNUSUAL SHORTNESS OF BREATH *UNUSUAL BRUISING OR BLEEDING *URINARY PROBLEMS (pain or burning when urinating, or frequent urination) *BOWEL PROBLEMS (unusual diarrhea, constipation, pain near the anus) TENDERNESS IN MOUTH AND THROAT WITH OR WITHOUT PRESENCE OF ULCERS (sore throat, sores in mouth, or a toothache) UNUSUAL RASH, SWELLING OR PAIN  UNUSUAL VAGINAL DISCHARGE OR ITCHING   Items with * indicate a potential emergency and should be followed up as soon as possible or go to the Emergency Department if any problems should occur.  Please show the CHEMOTHERAPY ALERT CARD or IMMUNOTHERAPY ALERT CARD at  check-in to the Emergency Department and triage nurse.  Should you have questions after your visit or need to cancel or reschedule your appointment, please contact [facility]  Dept: [phone]  and follow the prompts.  Office hours are 8:00 a.m. to 4:30 p.m. Monday - Friday. Please note that voicemails left after 4:00 p.m. may not be returned until the following business day.  We are closed weekends and major holidays. You have access to a nurse at all times for urgent questions. Please call the main number to the clinic Dept: [phone] and follow the prompts.   For any non-urgent questions, you may also contact your provider using MyChart. We now offer e-Visits for anyone 18 and older to request care online for non-urgent symptoms. For details visit [email or web address].   Also download the MyChart app! Go to the app store, search "MyChart", open the app, select [facility], and log in with your MyChart username and password.  Paclitaxel Nanoparticle Albumin-Bound Injection What is this medication? NANOPARTICLE ALBUMIN-BOUND PACLITAXEL (Na no PAHR ti kuhl al BYOO muhn-bound PAK li TAX el) treats some types of cancer. It works by slowing down the growth of cancer cells. This medicine may be used for other purposes; ask your health care provider or pharmacist if you have questions. COMMON BRAND NAME(S): Abraxane What should I tell my care team before I take this medication? They need to know if you have any of these conditions: Liver disease Low white blood cell levels An unusual or allergic reaction to paclitaxel, albumin, other medications, foods, dyes, or preservatives If you   or your partner are pregnant or trying to get pregnant Breast-feeding How should I use this medication? This medication is injected into a vein. It is given by your care team in a hospital or clinic setting. Talk to your care team about the use of this medication in children.  Special care may be needed. Overdosage: If you think you have taken too much of this medicine contact a poison control center or emergency room at once. NOTE: This medicine is only for you. Do not share this medicine with others. What if I miss a dose? Keep appointments for follow-up doses. It is important not to miss your dose. Call your care team if you are unable to keep an appointment. What may interact with this medication? Other medications may affect the way this medication works. Talk with your care team about all of the medications you take. They may suggest changes to your treatment plan to lower the risk of side effects and to make sure your medications work as intended. This list may not describe all possible interactions. Give your health care provider a list of all the medicines, herbs, non-prescription drugs, or dietary supplements you use. Also tell them if you smoke, drink alcohol, or use illegal drugs. Some items may interact with your medicine. What should I watch for while using this medication? Your condition will be monitored carefully while you are receiving this medication. You may need blood work while taking this medication. This medication may make you feel generally unwell. This is not uncommon as chemotherapy can affect healthy cells as well as cancer cells. Report any side effects. Continue your course of treatment even though you feel ill unless your care team tells you to stop. This medication can cause serious allergic reactions. To reduce the risk, your care team may give you other medications to take before receiving this one. Be sure to follow the directions from your care team. This medication may increase your risk of getting an infection. Call your care team for advice if you get a fever, chills, sore throat, or other symptoms of a cold or flu. Do not treat yourself. Try to avoid being around people who are sick. This medication may increase your risk to bruise or  bleed. Call your care team if you notice any unusual bleeding. Be careful brushing or flossing your teeth or using a toothpick because you may get an infection or bleed more easily. If you have any dental work done, tell your dentist you are receiving this medication. Talk to your care team if you or your partner may be pregnant. Serious birth defects can occur if you take this medication during pregnancy and for 6 months after the last dose. You will need a negative pregnancy test before starting this medication. Contraception is recommended while taking this medication and for 6 months after the last dose. Your care team can help you find the option that works for you. If your partner can get pregnant, use a condom during sex while taking this medication and for 3 months after the last dose. Do not breastfeed while taking this medication and for 2 weeks after the last dose. This medication may cause infertility. Talk to your care team if you are concerned about your fertility. What side effects may I notice from receiving this medication? Side effects that you should report to your care team as soon as possible: Allergic reactions--skin rash, itching, hives, swelling of the face, lips, tongue, or throat Dry cough, shortness of   breath or trouble breathing Infection--fever, chills, cough, sore throat, wounds that don't heal, pain or trouble when passing urine, general feeling of discomfort or being unwell Low red blood cell level--unusual weakness or fatigue, dizziness, headache, trouble breathing Pain, tingling, or numbness in the hands or feet Stomach pain, unusual weakness or fatigue, nausea, vomiting, diarrhea, or fever that lasts longer than expected Unusual bruising or bleeding Side effects that usually do not require medical attention (report to your care team if they continue or are bothersome): Diarrhea Fatigue Hair loss Loss of appetite Nausea Vomiting This list may not describe all  possible side effects. Call your doctor for medical advice about side effects. You may report side effects to FDA at 1-800-FDA-1088. Where should I keep my medication? This medication is given in a hospital or clinic. It will not be stored at home. NOTE: This sheet is a summary. It may not cover all possible information. If you have questions about this medicine, talk to your doctor, pharmacist, or health care provider.  2024 Elsevier/Gold Standard (2021-10-11 00:00:00)  Gemcitabine Injection What is this medication? GEMCITABINE (jem SYE ta been) treats some types of cancer. It works by slowing down the growth of cancer cells. This medicine may be used for other purposes; ask your health care provider or pharmacist if you have questions. COMMON BRAND NAME(S): Gemzar, Infugem What should I tell my care team before I take this medication? They need to know if you have any of these conditions: Blood disorders Infection Kidney disease Liver disease Lung or breathing disease, such as asthma or COPD Recent or ongoing radiation therapy An unusual or allergic reaction to gemcitabine, other medications, foods, dyes, or preservatives If you or your partner are pregnant or trying to get pregnant Breast-feeding How should I use this medication? This medication is injected into a vein. It is given by your care team in a hospital or clinic setting. Talk to your care team about the use of this medication in children. Special care may be needed. Overdosage: If you think you have taken too much of this medicine contact a poison control center or emergency room at once. NOTE: This medicine is only for you. Do not share this medicine with others. What if I miss a dose? Keep appointments for follow-up doses. It is important not to miss your dose. Call your care team if you are unable to keep an appointment. What may interact with this medication? Interactions have not been studied. This list may not  describe all possible interactions. Give your health care provider a list of all the medicines, herbs, non-prescription drugs, or dietary supplements you use. Also tell them if you smoke, drink alcohol, or use illegal drugs. Some items may interact with your medicine. What should I watch for while using this medication? Your condition will be monitored carefully while you are receiving this medication. This medication may make you feel generally unwell. This is not uncommon, as chemotherapy can affect healthy cells as well as cancer cells. Report any side effects. Continue your course of treatment even though you feel ill unless your care team tells you to stop. In some cases, you may be given additional medications to help with side effects. Follow all directions for their use. This medication may increase your risk of getting an infection. Call your care team for advice if you get a fever, chills, sore throat, or other symptoms of a cold or flu. Do not treat yourself. Try to avoid being around people who   are sick. This medication may increase your risk to bruise or bleed. Call your care team if you notice any unusual bleeding. Be careful brushing or flossing your teeth or using a toothpick because you may get an infection or bleed more easily. If you have any dental work done, tell your dentist you are receiving this medication. Avoid taking medications that contain aspirin, acetaminophen, ibuprofen, naproxen, or ketoprofen unless instructed by your care team. These medications may hide a fever. Talk to your care team if you or your partner wish to become pregnant or think you might be pregnant. This medication can cause serious birth defects if taken during pregnancy and for 6 months after the last dose. A negative pregnancy test is required before starting this medication. A reliable form of contraception is recommended while taking this medication and for 6 months after the last dose. Talk to your care  team about effective forms of contraception. Do not father a child while taking this medication and for 3 months after the last dose. Use a condom while having sex during this time period. Do not breastfeed while taking this medication and for at least 1 week after the last dose. This medication may cause infertility. Talk to your care team if you are concerned about your fertility. What side effects may I notice from receiving this medication? Side effects that you should report to your care team as soon as possible: Allergic reactions--skin rash, itching, hives, swelling of the face, lips, tongue, or throat Capillary leak syndrome--stomach or muscle pain, unusual weakness or fatigue, feeling faint or lightheaded, decrease in the amount of urine, swelling of the ankles, hands, or feet, trouble breathing Infection--fever, chills, cough, sore throat, wounds that don't heal, pain or trouble when passing urine, general feeling of discomfort or being unwell Liver injury--right upper belly pain, loss of appetite, nausea, light-colored stool, dark yellow or brown urine, yellowing skin or eyes, unusual weakness or fatigue Low red blood cell level--unusual weakness or fatigue, dizziness, headache, trouble breathing Lung injury--shortness of breath or trouble breathing, cough, spitting up blood, chest pain, fever Stomach pain, bloody diarrhea, pale skin, unusual weakness or fatigue, decrease in the amount of urine, which may be signs of hemolytic uremic syndrome Sudden and severe headache, confusion, change in vision, seizures, which may be signs of posterior reversible encephalopathy syndrome (PRES) Unusual bruising or bleeding Side effects that usually do not require medical attention (report to your care team if they continue or are bothersome): Diarrhea Drowsiness Hair loss Nausea Pain, redness, or swelling with sores inside the mouth or throat Vomiting This list may not describe all possible side  effects. Call your doctor for medical advice about side effects. You may report side effects to FDA at 1-800-FDA-1088. Where should I keep my medication? This medication is given in a hospital or clinic. It will not be stored at home. NOTE: This sheet is a summary. It may not cover all possible information. If you have questions about this medicine, talk to your doctor, pharmacist, or health care provider.  2024 Elsevier/Gold Standard (2021-10-02 00:00:00)  

## 2022-12-05 NOTE — Progress Notes (Signed)
[facility] OFFICE PROGRESS NOTE   Diagnosis: Pancreas cancer  INTERVAL HISTORY:   Alyssa Ross completed on cycle gemcitabine/Abraxane on 11/21/2022.  She reports no change in peripheral neuropathy symptoms.  Good appetite.  The skin rash is improving.  Objective:  Vital signs in last 24 hours:  Blood pressure 117/80, pulse 79, temperature 98.1 F (36.7 C), temperature source Oral, resp. rate 18, height 5\' 7"  (1.702 m), weight 197 lb 12.8 oz (89.7 kg), last menstrual period 09/17/2012, SpO2 100 %.    HEENT: No thrush or ulcers Resp: Lungs clear bilaterally Cardio: Regular rate and rhythm GI: No hepatosplenomegaly Vascular: No leg edema  Skin: Hyperpigmented rash over the face appears to be fading  Portacath/PICC-without erythema  Lab Results:  Lab Results  Component Value Date   WBC 4.5 12/05/2022   HGB 8.9 (L) 12/05/2022   HCT 28.6 (L) 12/05/2022   MCV 95.3 12/05/2022   PLT 294 12/05/2022   NEUTROABS 2.9 12/05/2022    CMP  Lab Results  Component Value Date   NA 137 11/21/2022   K 3.6 11/21/2022   CL 107 11/21/2022   CO2 25 11/21/2022   GLUCOSE 169 (H) 11/21/2022   BUN 21 (H) 11/21/2022   CREATININE 1.03 (H) 11/21/2022   CALCIUM 8.4 (L) 11/21/2022   PROT 6.8 11/21/2022   ALBUMIN 3.4 (L) 11/21/2022   AST 20 11/21/2022   ALT 21 11/21/2022   ALKPHOS 129 (H) 11/21/2022   BILITOT 0.4 11/21/2022   GFRNONAA >60 11/21/2022   GFRAA >60 02/07/2020    Lab Results  Component Value Date   [id number] 136 (H) 11/21/2022     Medications: I have reviewed the patient's current medications.   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 Tumor invades the duodenal wall and vascular groove, resection margins negative, 4/34 lymph nodes positive MSI-stable, tumor showed instability in 2 loci as did adjacent normal tissue Foundation 1-K-ras G12 V, microsatellite status and tumor mutation burden cannot be  determined EUS FNA biopsy of pancreas mass on 07/03/2018-well-differentiated neuroendocrine tumor CTs 01/29/2019-ill-defined pancreas head mass, 5 pulmonary nodules-1 with a small amount of central cavitation, tumor abuts the left margin of the portal vein indistinct density surrounding, hepatic artery, complex cystic lesion of the right kidney, right adrenal mass-characterized as an adenoma on a [facility] MRI 12/21/2018 [facility] 03/03/2019-no focal pancreas activity, no tracer accumulation within the suspicious pulmonary nodules, left uterine mass with tracer accumulation felt to represent a leiomyoma Elevated preoperative CA 19-9--CA 19-9 186 on 01/18/2019 CT chest 04/16/2019-multiple bilateral pulmonary nodules, some with increased cavitation, stable in size Cycle 1 FOLFIRINOX 04/27/2019 Cycle 2 FOLFIRINOX 05/11/2019 Cycle 3 FOLFIRINOX 05/23/2019 Cycle 4 FOLFIRINOX 06/08/2019 Cycle 5 FOLFIRINOX 06/22/2019 CT chest 07/02/2019-stable size of bilateral pulmonary nodules.  Dominant cavitary lesions in both lungs show increased cavitation with thinner walls.  Stable 2.1 cm right adrenal nodule. 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 No definite evidence of metastatic disease in the abdomen or pelvis. Markedly enlarged and heterogeneous appearing uterus, likely to represent multifocal fibroids. 1 of these lesions appears to be an exophytic subserosal fibroid in the posterior lateral aspect of the uterine body  on the left side although this comes in close proximity to the left adnexa such that a  primary ovarian lesion is difficult to completely exclude. CTs 02/07/2020-slight enlargement of bilateral lung nodules, some are cavitary, no evidence of metastatic disease in the abdomen or pelvis, stable right adrenal nodule, uterine fibroids CTs 04/26/2020-mild enlargement of pulmonary nodules, slight increase in trace pelvic fluid, new soft tissue thickening inferior to the cecal tip suspicious for peritoneal metastasis CT 05/26/2020-improved appearance of soft tissue at the inferior tip of the cecum, mildly thickened short appendix-findings suggestive of resolving appendicitis, stable small bibasilar pulmonary nodules, fibroids Plan biopsy of right cecal tip soft tissue canceled secondary to radiologic improvement CT chest 08/02/2020-enlargement and progressive cavitation of multiple bilateral lung nodules.  Some new nodules are present. CTs 10/24/2020- increase in size of pulmonary nodules, no new nodules, no evidence of metastatic disease in the abdomen, stable right adrenal nodule CT 01/09/2021-slight interval enlargement of pulmonary nodules, stable right adrenal nodule Navigation bronchoscopy 01/30/2021-left lower lobe cavitary nodule FNA-adenocarcinoma, brushing-adenocarcinoma.  Left lower lobe lavage-adenocarcinoma.  Right upper lobe brushing and FNA biopsy of cavitary nodule-adenocarcinoma-immunohistochemical profile consistent with pancreas adenocarcinoma Cycle 1 gemcitabine/Abraxane 03/28/2021 Cycle 2 gemcitabine/Abraxane 04/11/2021 Cycle 3 gemcitabine/Abraxane 04/25/2021 Cycle 4 gemcitabine/Abraxane 05/09/2021 Cycle 5 gemcitabine/Abraxane 05/23/2021 CT chest 06/05/2021-interval cavitation of multiple pulmonary nodules, some nodules have decreased in size, no new or enlarging nodules Cycle 6 gemcitabine/Abraxane 06/06/2021 Cycle 7 gemcitabine/Abraxane 06/21/2021 Cycle 8 gemcitabine/Abraxane 07/05/2021 Cycle 9 Gemcitabine/Abraxane 07/19/2021 Cycle 10 gemcitabine 08/01/2021-Abraxane held  secondary to neuropathy CT chest 08/13/2021-mild decrease in size and wall thickness of multiple cavitary nodules, no new or progressive disease in the chest, indeterminate low-attenuation right liver lesions Cycle 11 gemcitabine 08/16/2021-Abraxane held secondary to neuropathy Cycle 12 gemcitabine 08/30/2021-Abraxane held secondary to neuropathy Cycle 13 gemcitabine 09/13/2021-Abraxane held secondary to neuropathy Cycle 14 gemcitabine 09/27/2021-Abraxane held secondary to neuropathy Cycle 15 gemcitabine 10/11/2021-Abraxane held secondary to neuropathy CTs 10/22/2021-no change in multiple cavitary lung nodules, no evidence of disease progression, ill-defined hypodense lesion in the posterior right liver suspicious for metastatic disease Cycle 16 gemcitabine 10/25/2021 Cycle 17 gemcitabine 11/08/2021 Cycle 18 Gemcitabine 11/22/2021 Cycle 19 gemcitabine 12/06/2021 Cycle 20 Gemcitabine 12/20/2021 CT 12/31/2021-mild increase in size of bilateral pulmonary metastases, stable subtle continuation right liver lesions Cycle 20 gemcitabine/Abraxane 01/03/2022 Cycle 21 gemcitabine/Abraxane 01/17/2022 Cycle 22 gemcitabine/Abraxane 01/31/2022 Cycle 23 gemcitabine/Abraxane 02/14/2022 Cycle 24 gemcitabine/Abraxane 02/28/2022 CTs 03/11/2022-widespread metastatic disease to the lungs again noted with slight involution of several of the pulmonary nodules, no definite new nodules noted; interval cavitation of solid lesion in the posterior aspect right lobe of the liver, no new liver lesions noted. Cycle 25 Gemcitabine/Abraxane 03/14/2022 Cycle 26 gemcitabine/Abraxane 03/28/2022 Cycle 27 gemcitabine/Abraxane 04/25/2022 Cycle 28 gemcitabine/Abraxane 05/09/2022 Cycle 29 Gemcitabine 05/23/2022, Abraxane held due to neuropathy CTs 06/04/2022-stable multifocal cavitary pulmonary nodules.  No definite new nodules.  No new nodules greater than a centimeter.  Decreased size of the right posterior hepatic lobe lesion.  Generalized heterogeneity  and new focal areas of hypodensity in the liver. Cycle 30 Gemcitabine 06/06/2022, Abraxane held due to neuropathy Cycle 31 gemcitabine 06/20/2022, Abraxane held due to neuropathy Cycle 32 gemcitabine 07/04/2022, Abraxane held due to neuropathy Cycle 33 gemcitabine  07/18/2022 ,Abraxane held due to neuropathy Cycle 34 gemcitabine 08/15/2022, Abraxane held due to neuropathy Cycle 35 gemcitabine 08/29/2022, Abraxane held due to neuropathy CTs 09/10/2022-enlarging pulmonary metastases.  Subtle poorly defined hypoattenuating lesions in the liver, less well-seen than on 06/04/2022.  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  Apixaban 10/24/2022     Disposition: Alyssa Ross appears unchanged.  The CA 19-9 has been lower over the past month.  She will complete another cycle of gemcitabine/Abraxane today.  Alyssa Ross will return for an office visit and chemotherapy in 2 weeks.  She will be scheduled for restaging CTs 12/30/2022.  Thornton Papas, MD  12/05/2022  10:12 AM

## 2022-12-06 LAB — CANCER ANTIGEN 19-9: CA 19-9: 145 U/mL — ABNORMAL HIGH (ref 0–35)

## 2022-12-15 ENCOUNTER — Other Ambulatory Visit: Payer: Self-pay | Admitting: Oncology

## 2022-12-15 DIAGNOSIS — C250 Malignant neoplasm of head of pancreas: Secondary | ICD-10-CM

## 2022-12-15 IMAGING — CT CT CHEST W/O CM
2 of 4 series · 15 of 36 positions shown, 18 images · non-contrast
Comparison: 01/09/2021

CLINICAL DATA: Follow-up of pulmonary nodules. History of
pancreatic cancer, status post Whipple procedure.

EXAM:
CT CHEST WITHOUT CONTRAST
TECHNIQUE: Multidetector CT imaging of the chest was performed following the
standard protocol without IV contrast.

[Series 2: routine chest without · axial · non-contrast · 0.68mm/px · z∈[-79,+171]mm · 12 of 149 slices shown, 15 images]
[im 12/149  mediastinal]
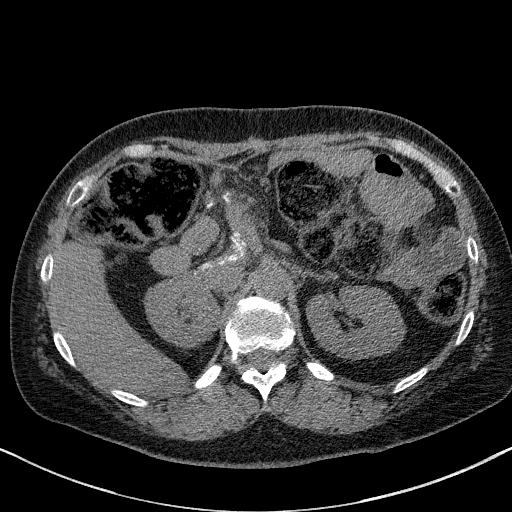
[im 12/149  lung]
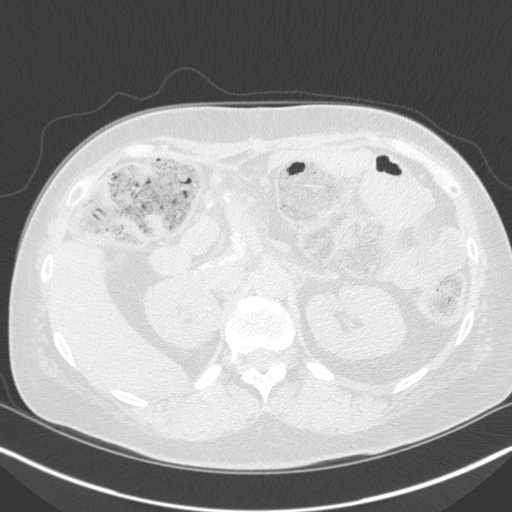
[im 23/149  lung]
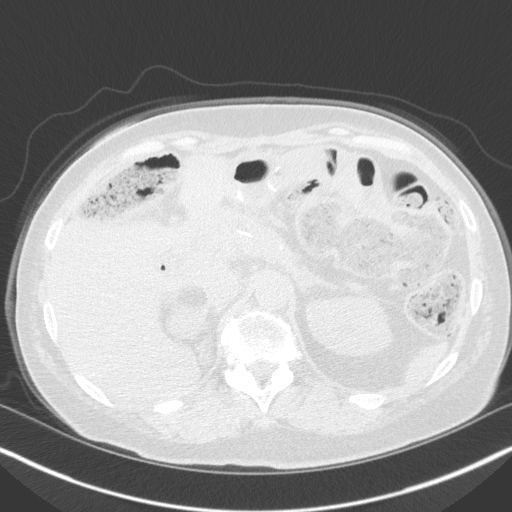
[im 35/149  lung]
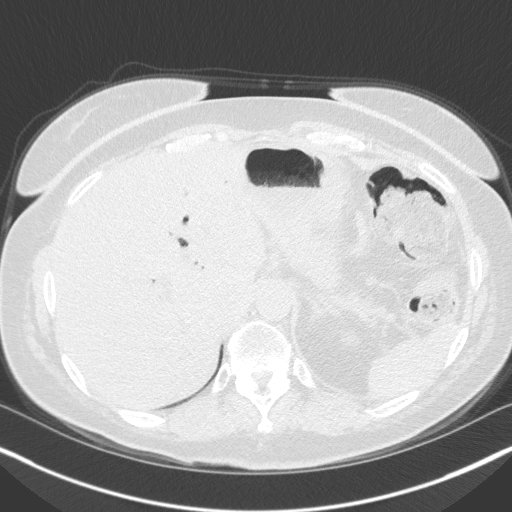
[im 46/149  lung]
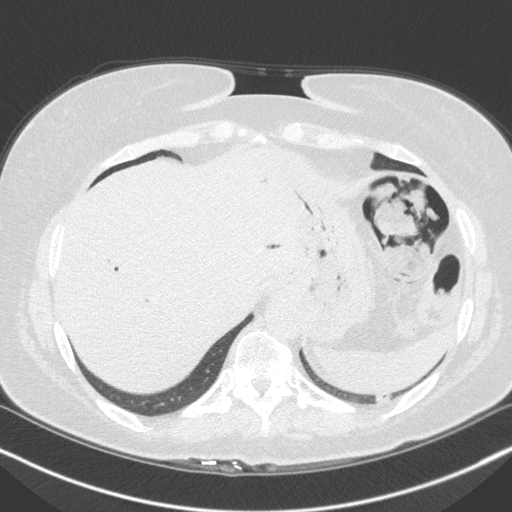
[im 57/149  mediastinal]
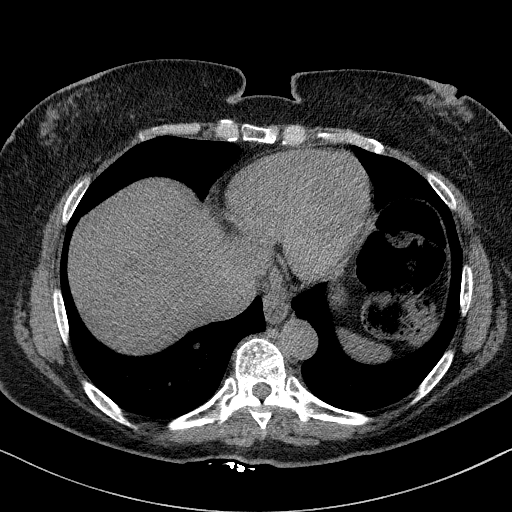
[im 57/149  lung]
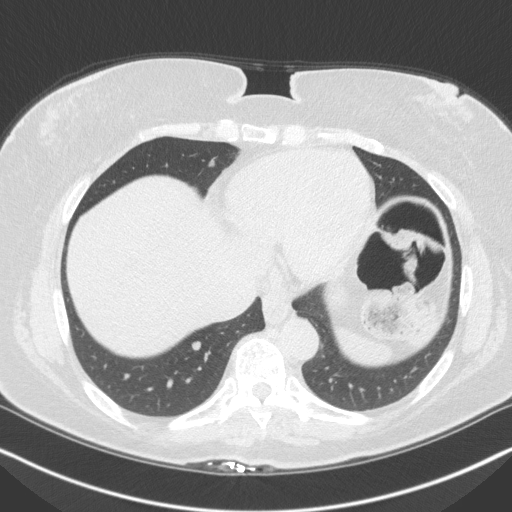
[im 69/149  lung]
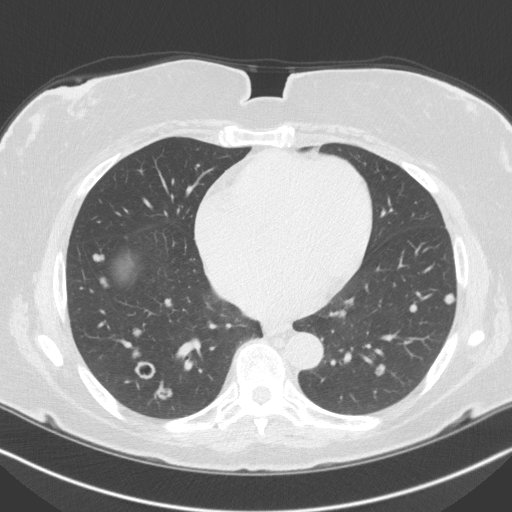
[im 80/149  lung]
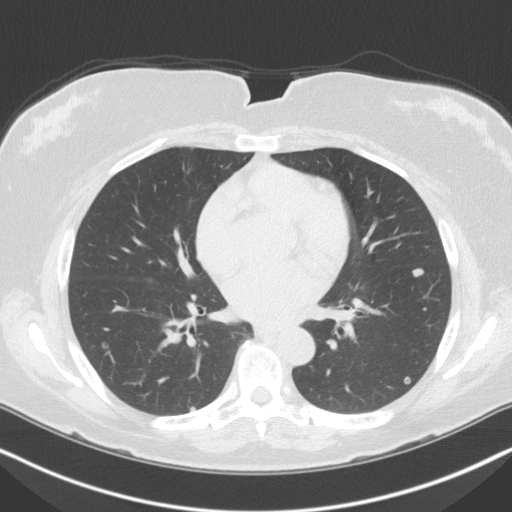
[im 92/149  lung]
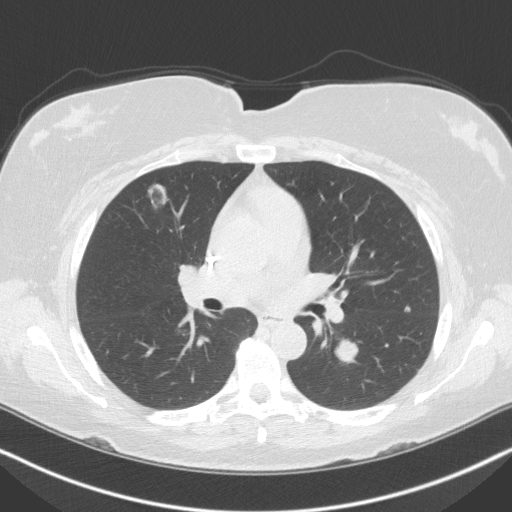
[im 103/149  mediastinal]
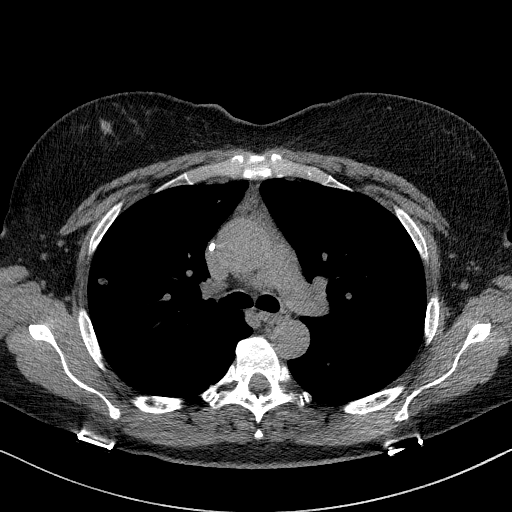
[im 103/149  lung]
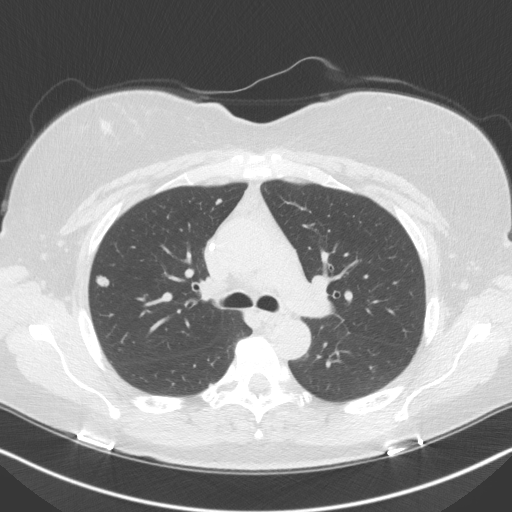
[im 114/149  lung]
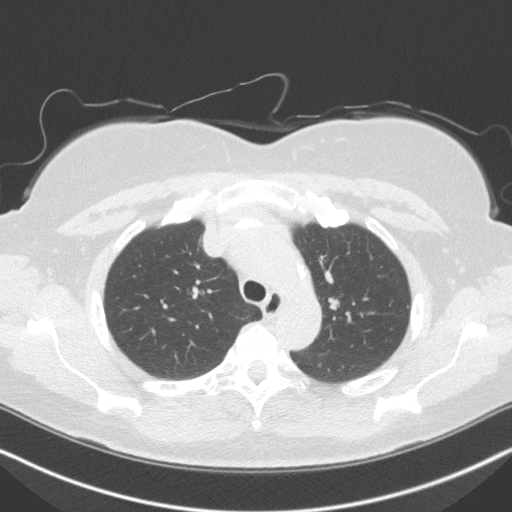
[im 126/149  lung]
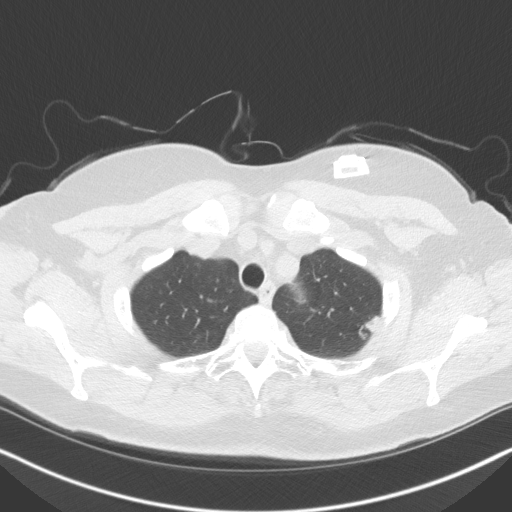
[im 137/149  lung]
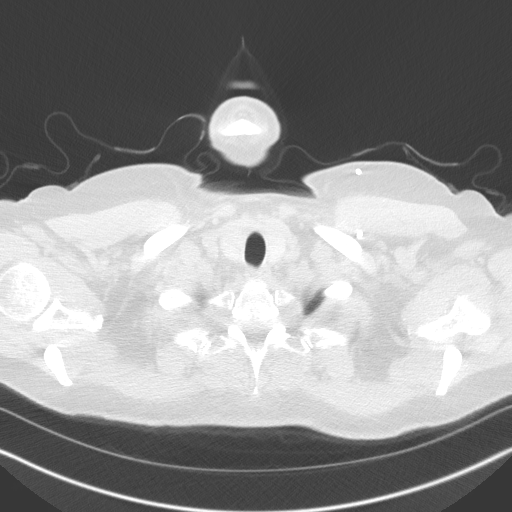

[Series 5: coronal · coronal · 0.60mm/px · 3 of 155 slices shown]
[im 31/155  lung]
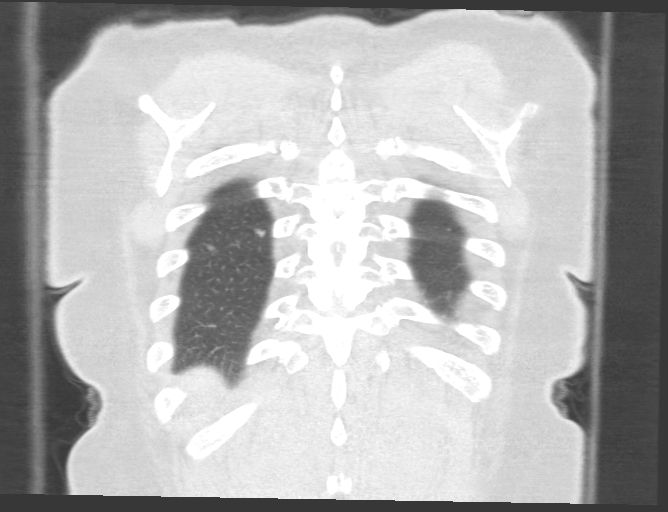
[im 62/155  lung]
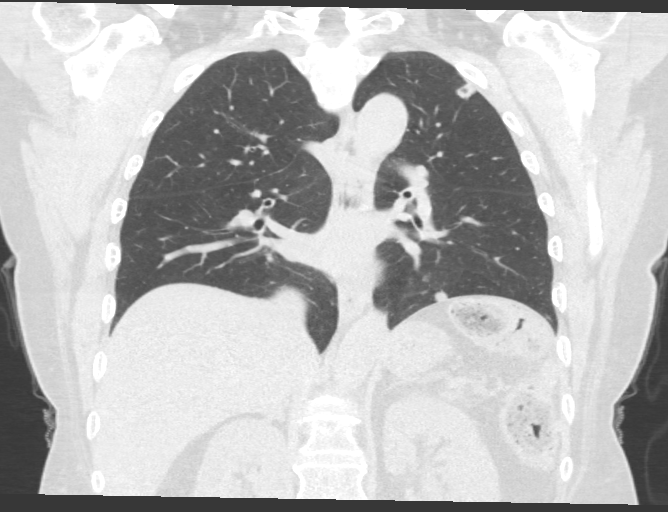
[im 93/155  lung]
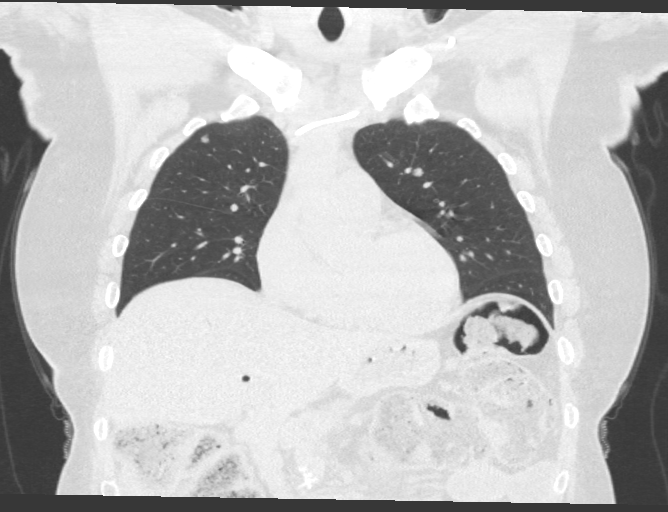

[15 of 36 positions shown; findings below may reference images not displayed]

FINDINGS: Cardiovascular: Left Port-A-Cath tip mid right atrium. Aortic
atherosclerosis. Tortuous thoracic aorta. Normal heart size, without
pericardial effusion.

Mediastinum/Nodes: No mediastinal or definite hilar adenopathy,
given limitations of unenhanced CT. Soft tissue density in the
anterior mediastinum is likely residual thymus.

Lungs/Pleura: No pleural fluid. Bilateral cavitary pulmonary
nodules.

mm on the prior.

Index left upper lobe pulmonary nodule measures 8 mm on 54/4 versus
7 mm on the prior.

Index left lower lobe nodule measures 1.9 cm on 56/4 versus similar.

Inferior right upper lobe nodule measures 1.9 cm on 61/4 versus
cm on the prior.

Right lower lobe nodule measures 1.2 cm on 80/4 and is similar to on
the prior (when remeasured).

Upper Abdomen: Pneumobilia. Normal imaged portions of the spleen,
left adrenal gland, kidneys. 1.7 cm right adrenal nodule is similar
to on the prior (when remeasured).

Status post Whipple procedure, suboptimally evaluated on this
noncontrast exam.

Musculoskeletal: No acute osseous abnormality.
IMPRESSION: 1. Similar pulmonary metastasis compared 01/09/2021.
2. No thoracic adenopathy.
3.  Aortic Atherosclerosis ([id number]).
4. Right adrenal nodule has been similar back to 01/29/2019, favored
to be benign.

## 2022-12-17 ENCOUNTER — Encounter: Payer: Self-pay | Admitting: Internal Medicine

## 2022-12-17 ENCOUNTER — Ambulatory Visit (INDEPENDENT_AMBULATORY_CARE_PROVIDER_SITE_OTHER): Payer: BC Managed Care – PPO | Admitting: Internal Medicine

## 2022-12-17 VITALS — BP 118/78 | HR 58 | Temp 97.8°F | Ht 68.2 in | Wt 197.0 lb

## 2022-12-17 DIAGNOSIS — Z0001 Encounter for general adult medical examination with abnormal findings: Secondary | ICD-10-CM | POA: Diagnosis not present

## 2022-12-17 DIAGNOSIS — T451X5A Adverse effect of antineoplastic and immunosuppressive drugs, initial encounter: Secondary | ICD-10-CM

## 2022-12-17 DIAGNOSIS — G620 Drug-induced polyneuropathy: Secondary | ICD-10-CM

## 2022-12-17 DIAGNOSIS — Z0000 Encounter for general adult medical examination without abnormal findings: Secondary | ICD-10-CM

## 2022-12-17 DIAGNOSIS — Z1211 Encounter for screening for malignant neoplasm of colon: Secondary | ICD-10-CM

## 2022-12-17 DIAGNOSIS — I10 Essential (primary) hypertension: Secondary | ICD-10-CM

## 2022-12-17 DIAGNOSIS — E1165 Type 2 diabetes mellitus with hyperglycemia: Secondary | ICD-10-CM

## 2022-12-17 DIAGNOSIS — Z794 Long term (current) use of insulin: Secondary | ICD-10-CM

## 2022-12-17 LAB — POCT URINALYSIS DIPSTICK
Bilirubin, UA: NEGATIVE
Blood, UA: NEGATIVE
Glucose, UA: NEGATIVE
Ketones, UA: NEGATIVE
Leukocytes, UA: NEGATIVE
Nitrite, UA: NEGATIVE
Protein, UA: POSITIVE — AB
Spec Grav, UA: >=1.03 — AB (ref 1.010–1.025)
Urobilinogen, UA: 0.2 E.U./dL
pH, UA: 6 (ref 5.0–8.0)

## 2022-12-17 NOTE — Patient Instructions (Signed)

## 2022-12-17 NOTE — Progress Notes (Signed)
I,Jameka J Llittleton, CMA,acting as a Neurosurgeon for Gwynneth Aliment, MD.,have documented all relevant documentation on the behalf of Gwynneth Aliment, MD,as directed by  Gwynneth Aliment, MD while in the presence of Gwynneth Aliment, MD.  Subjective:    Patient ID: Alyssa Ross , female    DOB: 04-23-1966 , 57 y.o.   MRN: [id number]  Chief Complaint  Patient presents with   Annual Exam   Diabetes   Hypertension    HPI  She is here today for a full physical examination. She is followed by Dr. Normand Sloop for her GYN care.  Patient does not have any chest pain or sob. She does not have any questions or concerns at this time.  She is still under the care of Dr. Truett Perna who is treating her metastatic pancreatic cancer. She is currently receiving Abraxane and Gemcitabine q28days.    Letter sent for her eye exam and mammogram.  Diabetes She presents for her follow-up diabetic visit. She has type 2 diabetes mellitus. There are no hypoglycemic associated symptoms. Pertinent negatives for diabetes include no blurred vision, no chest pain, no polydipsia, no polyphagia and no polyuria. There are no hypoglycemic complications. Risk factors for coronary artery disease include diabetes mellitus, hypertension and dyslipidemia. Current diabetic treatment includes insulin injections. She is compliant with treatment most of the time. She is following a diabetic diet. She participates in exercise intermittently. Eye exam is current.  Hypertension This is a chronic problem. The current episode started more than 1 year ago. The problem has been gradually improving since onset. The problem is uncontrolled. Pertinent negatives include no blurred vision, chest pain, palpitations or shortness of breath. Past treatments include ACE inhibitors and diuretics. The current treatment provides moderate improvement.     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   Yeast vaginitis 2006     Family History  Problem Relation Age of Onset   Diabetes Mother    Dementia Mother    Hyperlipidemia Mother    Hypertension Mother    Irregular heart beat Mother    Diabetes Father    Hypertension Father    Hyperlipidemia Father    Down syndrome Sister    Diabetes Sister    Hyperlipidemia Brother    Heart Problems Brother    Goiter Maternal Aunt    Thyroid nodules Sister    Cancer Cousin 60       eye; maternal first cousin   Goiter Cousin    Cancer Paternal Aunt        unknown form of cancer   Cancer Cousin        unknown form of cancer; paternal first cousin   Pancreatic cancer Cousin 12       paternal first cousin   Cancer Cousin 55  unknown cancer; paternal first cousin   Cancer Cousin 71       unknown cancer; paternal first cousin     Current Outpatient Medications:    apixaban (ELIQUIS) 5 MG TABS tablet, Take 1 tablet (5 mg total) by mouth 2 (two) times daily., Disp: 60 tablet, Rfl: 3   cetirizine (ZYRTEC) 10 MG tablet, Take 10 mg by mouth daily., Disp: , Rfl:    Continuous Blood Gluc Receiver (FREESTYLE LIBRE 14 DAY READER) DEVI, USE READER TO CHECK BLOOD GLUCOSE WITH FREESTYLE LIBRE SENSORS., Disp: 1 Device, Rfl: 0   Continuous Blood Gluc Sensor (FREESTYLE LIBRE 2 SENSOR) MISC, APPLY EVERY 14 DAYS, Disp: 2 each, Rfl: 5   glucose blood test strip, Check sugar 2 times daily., Disp: 100 each, Rfl: 12    Insulin Degludec (TRESIBA FLEXTOUCH [place]), Inject into the skin. 12-18 Units, Disp: , Rfl:    insulin lispro (HUMALOG KWIKPEN) 200 UNIT/ML KwikPen, 6 units with breakfast, 13 units with lunch, 16 units with dinner, Disp: , Rfl:    Insulin Pen Needle (BD PEN NEEDLE NANO U/F) 32G X 4 MM MISC, USE TO INJECT INSULIN THREE TIMES A DAY, Disp: 270 each, Rfl: 3   lidocaine-prilocaine (EMLA) cream, Apply 1 Application topically as directed. Apply 1 hour prior to stick and cover with plastic wrap, Disp: 30 g, Rfl: 5   magic mouthwash SOLN, Take 5 mLs by mouth 4 (four) times daily as needed for mouth pain. (Patient not taking: Reported on 12/19/2022), Disp: 240 mL, Rfl: 0   magnesium oxide (MAG-OX) 400 (240 Mg) MG tablet, TAKE 1 TABLET(400 MG) BY MOUTH TWICE DAILY, Disp: 180 tablet, Rfl: 1   potassium chloride SA (KLOR-CON M) 20 MEQ tablet, Take 1 tablet (20 mEq total) by mouth 2 (two) times daily., Disp: 180 tablet, Rfl: 1   prochlorperazine (COMPAZINE) 10 MG tablet, TAKE 1 TABLET(10 MG) BY MOUTH EVERY 6 HOURS AS NEEDED FOR NAUSEA OR VOMITING (Patient not taking: Reported on 12/19/2022), Disp: 60 tablet, Rfl: 0   rosuvastatin (CRESTOR) 10 MG tablet, TAKE 1 TABLET AT BEDTIME, Disp: 90 tablet, Rfl: 3   spironolactone (ALDACTONE) 25 MG tablet, Take 1 tablet (25 mg total) by mouth daily., Disp: 90 tablet, Rfl: 2   tretinoin (RETIN-A) 0.05 % cream, Apply topically at bedtime., Disp: , Rfl:    valsartan (DIOVAN) 160 MG tablet, TAKE 1 TABLET BY MOUTH TWICE DAILY, Disp: 180 tablet, Rfl: 1   doxycycline (VIBRA-TABS) 100 MG tablet, Take 1 tablet (100 mg total) by mouth daily. (Patient not taking: Reported on 12/17/2022), Disp: 30 tablet, Rfl: 0   metroNIDAZOLE (METROCREAM) 0.75 % cream, Apply 1 Application topically 2 (two) times daily. To face (Patient not taking: Reported on 12/17/2022), Disp: , Rfl:  No current facility-administered medications for this visit.  Facility-Administered Medications Ordered in Other Visits:     sodium chloride flush (NS) 0.9 % injection 10 mL, 10 mL, Intravenous, PRN, Rana Snare, NP, 10 mL at 10/24/22 1311   Allergies  Allergen Reactions   Cherry Rash, Itching and Other (See Comments)    Other reaction(s): Unknown Other reaction(s): Unknown   Lemon Oil Rash      The patient states she uses post menopausal status for birth control. Patient's last menstrual period was 09/17/2012.. Negative for Dysmenorrhea. Negative for: breast discharge, breast lump(s), breast pain and breast self exam. Associated symptoms include abnormal vaginal bleeding. Pertinent negatives include abnormal bleeding (hematology), anxiety, decreased libido, depression, difficulty falling sleep, dyspareunia, history of infertility,  nocturia, sexual dysfunction, sleep disturbances, urinary incontinence, urinary urgency, vaginal discharge and vaginal itching. Diet regular.The patient states her exercise level is    . The patient's tobacco use is:  Social History   Tobacco Use  Smoking Status Never  Smokeless Tobacco Never  . She has been exposed to passive smoke. The patient's alcohol use is:  Social History   Substance and Sexual Activity  Alcohol Use Not Currently    Review of Systems  Constitutional: Negative.   HENT: Negative.    Eyes: Negative.  Negative for blurred vision.  Respiratory: Negative.  Negative for shortness of breath.   Cardiovascular:  Negative for chest pain and palpitations.  Gastrointestinal: Negative.   Endocrine: Negative.  Negative for polydipsia, polyphagia and polyuria.  Genitourinary: Negative.   Musculoskeletal: Negative.   Skin: Negative.   Allergic/Immunologic: Negative.   Neurological: Negative.   Psychiatric/Behavioral: Negative.       Today's Vitals   12/17/22 1059  BP: 118/78  Pulse: (!) 58  Temp: 97.8 F (36.6 C)  Weight: 197 lb (89.4 kg)  Height: 5' 8.2" (1.732 m)  PainSc: 0-No pain   Body mass index is 29.78 kg/m.  Wt Readings from Last 3  Encounters:  01/02/23 196 lb (88.9 kg)  12/19/22 199 lb 6.4 oz (90.4 kg)  12/17/22 197 lb (89.4 kg)     Objective:  Physical Exam Vitals and nursing note reviewed.  Constitutional:      Appearance: Normal appearance.  HENT:     Head: Normocephalic and atraumatic.     Right Ear: Tympanic membrane, ear canal and external ear normal.     Left Ear: Tympanic membrane, ear canal and external ear normal.     Nose: Nose normal.     Mouth/Throat:     Mouth: Mucous membranes are moist.     Pharynx: Oropharynx is clear.  Eyes:     Extraocular Movements: Extraocular movements intact.     Conjunctiva/sclera: Conjunctivae normal.     Pupils: Pupils are equal, round, and reactive to light.  Cardiovascular:     Rate and Rhythm: Normal rate and regular rhythm.     Pulses: Normal pulses.          Dorsalis pedis pulses are 2+ on the right side and 2+ on the left side.     Heart sounds: Normal heart sounds.  Pulmonary:     Effort: Pulmonary effort is normal.     Breath sounds: Normal breath sounds.  Abdominal:     General: Abdomen is flat. Bowel sounds are normal.     Palpations: Abdomen is soft.     Comments: Healed surgical scar  Genitourinary:    Comments: deferred Musculoskeletal:        General: Normal range of motion.     Cervical back: Normal range of motion and neck supple.  Feet:     Right foot:     Protective Sensation: 5 sites tested.  5 sites sensed.     Skin integrity: Skin integrity normal.     Toenail Condition: Right toenails are normal.     Left foot:     Protective Sensation: 5 sites tested.  5 sites sensed.     Skin integrity: Skin integrity normal.     Toenail Condition: Left toenails are normal.  Skin:    General: Skin is warm and dry.     Comments: Hyperpigmented, feels dry  Neurological:     General: No focal deficit present.  Mental Status: She is alert and oriented to person, place, and time.  Psychiatric:        Mood and Affect: Mood normal.         Behavior: Behavior normal.         Assessment And Plan:     Encounter for general adult medical examination w/o abnormal findings Assessment & Plan: A full exam was performed, encouraged to perform monthly self breast exams. Advised by oncologist that screenings for breast/colon/cervical CA is no longer needed due to her current condition.  PATIENT IS ADVISED TO GET 30-45 MINUTES REGULAR EXERCISE NO LESS THAN FOUR TO FIVE DAYS PER WEEK - BOTH WEIGHTBEARING EXERCISES AND AEROBIC ARE RECOMMENDED.  PATIENT IS ADVISED TO FOLLOW A HEALTHY DIET WITH AT LEAST SIX FRUITS/VEGGIES PER DAY, DECREASE INTAKE OF RED MEAT, AND TO INCREASE FISH INTAKE TO TWO DAYS PER WEEK.  MEATS/FISH SHOULD NOT BE FRIED, BAKED OR BROILED IS PREFERABLE.  IT IS ALSO IMPORTANT TO CUT BACK ON YOUR SUGAR INTAKE. PLEASE AVOID ANYTHING WITH ADDED SUGAR, CORN SYRUP OR OTHER SWEETENERS. IF YOU MUST USE A SWEETENER, YOU CAN TRY STEVIA. IT IS ALSO IMPORTANT TO AVOID ARTIFICIALLY SWEETENERS AND DIET BEVERAGES. LASTLY, I SUGGEST WEARING SPF 50 SUNSCREEN ON EXPOSED PARTS AND ESPECIALLY WHEN IN THE DIRECT SUNLIGHT FOR AN EXTENDED PERIOD OF TIME.  PLEASE AVOID FAST FOOD RESTAURANTS AND INCREASE YOUR WATER INTAKE.   Orders: -     Lipid panel -     Hemoglobin A1c -     TSH  Uncontrolled type 2 diabetes mellitus with hyperglycemia, with long-term current use of insulin (HCC) Assessment & Plan: Chronic, diabetic foot exam was performed. She is now followed by Endo at [facility]. Most recent notes reviewed in full detail.  She will f/u in six months. I DISCUSSED WITH THE PATIENT AT LENGTH REGARDING THE GOALS OF GLYCEMIC CONTROL AND POSSIBLE LONG-TERM COMPLICATIONS.  I  ALSO STRESSED THE IMPORTANCE OF COMPLIANCE WITH HOME GLUCOSE MONITORING, DIETARY RESTRICTIONS INCLUDING AVOIDANCE OF SUGARY DRINKS/PROCESSED FOODS,  ALONG WITH REGULAR EXERCISE.  I  ALSO STRESSED THE IMPORTANCE OF ANNUAL EYE EXAMS, SELF FOOT CARE AND COMPLIANCE WITH OFFICE  VISITS.    Essential hypertension Assessment & Plan: Chronic, well controlled. EKG performed, SB w/o acute changes. She will c/o spironolactone 25mg  and valsartan 160mg  daily. Encouraged to follow low sodium diet. She will f/u in six months.   Orders: -     POCT urinalysis dipstick -     Microalbumin / creatinine urine ratio -     EKG 12-Lead  Chemotherapy-induced neuropathy (HCC) Assessment & Plan: Chronic, sx have improved since she is no longer on Oxaliplatin.      Return for 1 year physical, 6 month bp. Patient was given opportunity to ask questions. Patient verbalized understanding of the plan and was able to repeat key elements of the plan. All questions were answered to their satisfaction.    I, Gwynneth Aliment, MD, have reviewed all documentation for this visit. The documentation on 12/17/22 for the exam, diagnosis, procedures, and orders are all accurate and complete.

## 2022-12-18 ENCOUNTER — Encounter: Payer: Self-pay | Admitting: Internal Medicine

## 2022-12-18 ENCOUNTER — Other Ambulatory Visit: Payer: Self-pay

## 2022-12-18 LAB — LIPID PANEL
Chol/HDL Ratio: 2.7 ratio (ref 0.0–4.4)
Cholesterol, Total: 120 mg/dL (ref 100–199)
HDL: 44 mg/dL (ref >39)
LDL Chol Calc (NIH): 62 mg/dL (ref 0–99)
Triglycerides: 68 mg/dL (ref 0–149)
VLDL Cholesterol Cal: 14 mg/dL (ref 5–40)

## 2022-12-18 LAB — MICROALBUMIN / CREATININE URINE RATIO
Creatinine, Urine: 155.5 mg/dL
Microalb/Creat Ratio: 70 mg/g creat — ABNORMAL HIGH (ref 0–29)
Microalbumin, Urine: 108.2 ug/mL

## 2022-12-18 LAB — HEMOGLOBIN A1C
Est. average glucose Bld gHb Est-mCnc: 220 mg/dL
Hgb A1c MFr Bld: 9.3 % — ABNORMAL HIGH (ref 4.8–5.6)

## 2022-12-18 LAB — TSH: TSH: 0.915 u[IU]/mL (ref 0.450–4.500)

## 2022-12-19 ENCOUNTER — Inpatient Hospital Stay: Payer: BC Managed Care – PPO

## 2022-12-19 ENCOUNTER — Encounter: Payer: Self-pay | Admitting: Internal Medicine

## 2022-12-19 ENCOUNTER — Encounter: Payer: Self-pay | Admitting: *Deleted

## 2022-12-19 ENCOUNTER — Inpatient Hospital Stay: Payer: BC Managed Care – PPO | Attending: Genetic Counselor | Admitting: Oncology

## 2022-12-19 VITALS — BP 116/78 | HR 60 | Resp 18

## 2022-12-19 VITALS — BP 116/69 | HR 56 | Temp 98.2°F | Resp 18 | Ht 67.91 in | Wt 199.4 lb

## 2022-12-19 DIAGNOSIS — C250 Malignant neoplasm of head of pancreas: Secondary | ICD-10-CM

## 2022-12-19 DIAGNOSIS — C7802 Secondary malignant neoplasm of left lung: Secondary | ICD-10-CM | POA: Diagnosis not present

## 2022-12-19 DIAGNOSIS — Z79899 Other long term (current) drug therapy: Secondary | ICD-10-CM | POA: Diagnosis not present

## 2022-12-19 DIAGNOSIS — E1140 Type 2 diabetes mellitus with diabetic neuropathy, unspecified: Secondary | ICD-10-CM | POA: Diagnosis not present

## 2022-12-19 DIAGNOSIS — I10 Essential (primary) hypertension: Secondary | ICD-10-CM | POA: Diagnosis not present

## 2022-12-19 DIAGNOSIS — C7801 Secondary malignant neoplasm of right lung: Secondary | ICD-10-CM | POA: Insufficient documentation

## 2022-12-19 DIAGNOSIS — N6321 Unspecified lump in the left breast, upper outer quadrant: Secondary | ICD-10-CM | POA: Insufficient documentation

## 2022-12-19 DIAGNOSIS — Z5111 Encounter for antineoplastic chemotherapy: Secondary | ICD-10-CM | POA: Diagnosis not present

## 2022-12-19 LAB — CBC WITH DIFFERENTIAL (CANCER CENTER ONLY)
Abs Immature Granulocytes: 0.01 10*3/uL (ref 0.00–0.07)
Basophils Absolute: 0 10*3/uL (ref 0.0–0.1)
Basophils Relative: 0 %
Eosinophils Absolute: 0.1 10*3/uL (ref 0.0–0.5)
Eosinophils Relative: 2 %
HCT: 28.3 % — ABNORMAL LOW (ref 36.0–46.0)
Hemoglobin: 8.7 g/dL — ABNORMAL LOW (ref 12.0–15.0)
Immature Granulocytes: 0 %
Lymphocytes Relative: 16 %
Lymphs Abs: 0.9 10*3/uL (ref 0.7–4.0)
MCH: 29.3 pg (ref 26.0–34.0)
MCHC: 30.7 g/dL (ref 30.0–36.0)
MCV: 95.3 fL (ref 80.0–100.0)
Monocytes Absolute: 0.7 10*3/uL (ref 0.1–1.0)
Monocytes Relative: 13 %
Neutro Abs: 3.9 10*3/uL (ref 1.7–7.7)
Neutrophils Relative %: 69 %
Platelet Count: 420 10*3/uL — ABNORMAL HIGH (ref 150–400)
RBC: 2.97 MIL/uL — ABNORMAL LOW (ref 3.87–5.11)
RDW: 14.8 % (ref 11.5–15.5)
WBC Count: 5.7 10*3/uL (ref 4.0–10.5)
nRBC: 0 % (ref 0.0–0.2)

## 2022-12-19 LAB — CMP (CANCER CENTER ONLY)
ALT: 22 U/L (ref 0–44)
AST: 22 U/L (ref 15–41)
Albumin: 3.7 g/dL (ref 3.5–5.0)
Alkaline Phosphatase: 193 U/L — ABNORMAL HIGH (ref 38–126)
Anion gap: 5 (ref 5–15)
BUN: 9 mg/dL (ref 6–20)
CO2: 22 mmol/L (ref 22–32)
Calcium: 8.7 mg/dL — ABNORMAL LOW (ref 8.9–10.3)
Chloride: 108 mmol/L (ref 98–111)
Creatinine: 1.1 mg/dL — ABNORMAL HIGH (ref 0.44–1.00)
GFR, Estimated: 59 mL/min — ABNORMAL LOW (ref >60)
Glucose, Bld: 253 mg/dL — ABNORMAL HIGH (ref 70–99)
Potassium: 3.8 mmol/L (ref 3.5–5.1)
Sodium: 135 mmol/L (ref 135–145)
Total Bilirubin: 0.4 mg/dL (ref 0.3–1.2)
Total Protein: 6.9 g/dL (ref 6.5–8.1)

## 2022-12-19 LAB — MAGNESIUM: Magnesium: 1.7 mg/dL (ref 1.7–2.4)

## 2022-12-19 MED ORDER — SODIUM CHLORIDE 0.9 % IV SOLN: Freq: Once | INTRAVENOUS | Status: AC

## 2022-12-19 MED ORDER — HEPARIN SOD (PORK) LOCK FLUSH 100 UNIT/ML IV SOLN
500.0000 [IU] | Freq: Once | INTRAVENOUS | Status: AC | PRN
  Administered 2022-12-19: 500 [IU]

## 2022-12-19 MED ORDER — SODIUM CHLORIDE 0.9 % IV SOLN
2000.0000 mg | Freq: Once | INTRAVENOUS | Status: AC
  Administered 2022-12-19: 2000 mg via INTRAVENOUS
  Filled 2022-12-19: qty 52.6

## 2022-12-19 MED ORDER — PACLITAXEL PROTEIN-BOUND CHEMO INJECTION 100 MG
100.0000 mg/m2 | Freq: Once | INTRAVENOUS | Status: AC
  Administered 2022-12-19: 200 mg via INTRAVENOUS
  Filled 2022-12-19: qty 40

## 2022-12-19 MED ORDER — PROCHLORPERAZINE MALEATE 10 MG PO TABS
10.0000 mg | ORAL_TABLET | Freq: Once | ORAL | Status: AC
  Administered 2022-12-19: 10 mg via ORAL
  Filled 2022-12-19: qty 1

## 2022-12-19 MED ORDER — SODIUM CHLORIDE 0.9% FLUSH
10.0000 mL | INTRAVENOUS | Status: DC | PRN
  Administered 2022-12-19: 10 mL

## 2022-12-19 NOTE — Progress Notes (Signed)
Alyssa Ross OFFICE PROGRESS NOTE   Diagnosis: Pancreas cancer  INTERVAL HISTORY:   Alyssa Ross completed another cycle of gemcitabine/Abraxane on 12/05/2022.  No rash or fever.  No change in neuropathy symptoms.  No new complaint.  Objective:  Vital signs in last 24 hours:  Blood pressure 116/69, pulse (!) 56, temperature 98.2 F (36.8 C), temperature source Temporal, resp. rate 18, height 5' 7.91" (1.725 m), weight 199 lb 6.4 oz (90.4 kg), last menstrual period 09/17/2012, SpO2 95%.    HEENT: No thrush or ulcers Resp: Lungs clear bilaterally Cardio: Regular rate and rhythm GI: No hepatosplenomegaly, no mass, nontender Vascular: No leg edema  Skin: Improvement in the hyperpigmented rash over the face and trunk  Portacath/PICC-without erythema  Lab Results:  Lab Results  Component Value Date   WBC 5.7 12/19/2022   HGB 8.7 (L) 12/19/2022   HCT 28.3 (L) 12/19/2022   MCV 95.3 12/19/2022   PLT 420 (H) 12/19/2022   NEUTROABS 3.9 12/19/2022    CMP  Lab Results  Component Value Date   NA 138 12/05/2022   K 3.8 12/05/2022   CL 108 12/05/2022   CO2 24 12/05/2022   GLUCOSE 176 (H) 12/05/2022   BUN 8 12/05/2022   CREATININE 1.05 (H) 12/05/2022   CALCIUM 8.4 (L) 12/05/2022   PROT 7.0 12/05/2022   ALBUMIN 3.5 12/05/2022   AST 18 12/05/2022   ALT 22 12/05/2022   ALKPHOS 177 (H) 12/05/2022   BILITOT 0.4 12/05/2022   GFRNONAA >60 12/05/2022   GFRAA >60 02/07/2020    Lab Results  Component Value Date   ZOX096 145 (H) 12/05/2022     Medications: I have reviewed the patient's current medications.   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 Tumor invades the duodenal wall and vascular groove, resection margins negative, 4/34 lymph nodes positive MSI-stable, tumor showed instability in 2 loci as did adjacent normal tissue Foundation 1-K-ras G12 V, microsatellite status and tumor mutation burden  cannot be determined EUS FNA biopsy of pancreas mass on 07/03/2018-well-differentiated neuroendocrine tumor CTs 01/29/2019-ill-defined pancreas head mass, 5 pulmonary nodules-1 with a small amount of central cavitation, tumor abuts the left margin of the portal vein indistinct density surrounding, hepatic artery, complex cystic lesion of the right kidney, right adrenal mass-characterized as an adenoma on a [facility] MRI 12/21/2018 [facility] 03/03/2019-no focal pancreas activity, no tracer accumulation within the suspicious pulmonary nodules, left uterine mass with tracer accumulation felt to represent a leiomyoma Elevated preoperative CA 19-9--CA 19-9 186 on 01/18/2019 CT chest 04/16/2019-multiple bilateral pulmonary nodules, some with increased cavitation, stable in size Cycle 1 FOLFIRINOX 04/27/2019 Cycle 2 FOLFIRINOX 05/11/2019 Cycle 3 FOLFIRINOX 05/23/2019 Cycle 4 FOLFIRINOX 06/08/2019 Cycle 5 FOLFIRINOX 06/22/2019 CT chest 07/02/2019-stable size of bilateral pulmonary nodules.  Dominant cavitary lesions in both lungs show increased cavitation with thinner walls.  Stable 2.1 cm right adrenal nodule. 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 No definite evidence of metastatic disease in the abdomen or pelvis. Markedly enlarged and heterogeneous appearing uterus, likely to represent multifocal fibroids. 1 of these lesions appears to be an exophytic subserosal fibroid in the posterior lateral aspect of the uterine  body on the left side although this comes in close proximity to the left adnexa such  that a primary ovarian lesion is difficult to completely exclude. CTs 02/07/2020-slight enlargement of bilateral lung nodules, some are cavitary, no evidence of metastatic disease in the abdomen or pelvis, stable right adrenal nodule, uterine fibroids CTs 04/26/2020-mild enlargement of pulmonary nodules, slight increase in trace pelvic fluid, new soft tissue thickening inferior to the cecal tip suspicious for peritoneal metastasis CT 05/26/2020-improved appearance of soft tissue at the inferior tip of the cecum, mildly thickened short appendix-findings suggestive of resolving appendicitis, stable small bibasilar pulmonary nodules, fibroids Plan biopsy of right cecal tip soft tissue canceled secondary to radiologic improvement CT chest 08/02/2020-enlargement and progressive cavitation of multiple bilateral lung nodules.  Some new nodules are present. CTs 10/24/2020- increase in size of pulmonary nodules, no new nodules, no evidence of metastatic disease in the abdomen, stable right adrenal nodule CT 01/09/2021-slight interval enlargement of pulmonary nodules, stable right adrenal nodule Navigation bronchoscopy 01/30/2021-left lower lobe cavitary nodule FNA-adenocarcinoma, brushing-adenocarcinoma.  Left lower lobe lavage-adenocarcinoma.  Right upper lobe brushing and FNA biopsy of cavitary nodule-adenocarcinoma-immunohistochemical profile consistent with pancreas adenocarcinoma Cycle 1 gemcitabine/Abraxane 03/28/2021 Cycle 2 gemcitabine/Abraxane 04/11/2021 Cycle 3 gemcitabine/Abraxane 04/25/2021 Cycle 4 gemcitabine/Abraxane 05/09/2021 Cycle 5 gemcitabine/Abraxane 05/23/2021 CT chest 06/05/2021-interval cavitation of multiple pulmonary nodules, some nodules have decreased in size, no new or enlarging nodules Cycle 6 gemcitabine/Abraxane 06/06/2021 Cycle 7 gemcitabine/Abraxane 06/21/2021 Cycle 8 gemcitabine/Abraxane 07/05/2021 Cycle 9 Gemcitabine/Abraxane 07/19/2021 Cycle 10 gemcitabine 08/01/2021-Abraxane held  secondary to neuropathy CT chest 08/13/2021-mild decrease in size and wall thickness of multiple cavitary nodules, no new or progressive disease in the chest, indeterminate low-attenuation right liver lesions Cycle 11 gemcitabine 08/16/2021-Abraxane held secondary to neuropathy Cycle 12 gemcitabine 08/30/2021-Abraxane held secondary to neuropathy Cycle 13 gemcitabine 09/13/2021-Abraxane held secondary to neuropathy Cycle 14 gemcitabine 09/27/2021-Abraxane held secondary to neuropathy Cycle 15 gemcitabine 10/11/2021-Abraxane held secondary to neuropathy CTs 10/22/2021-no change in multiple cavitary lung nodules, no evidence of disease progression, ill-defined hypodense lesion in the posterior right liver suspicious for metastatic disease Cycle 16 gemcitabine 10/25/2021 Cycle 17 gemcitabine 11/08/2021 Cycle 18 Gemcitabine 11/22/2021 Cycle 19 gemcitabine 12/06/2021 Cycle 20 Gemcitabine 12/20/2021 CT 12/31/2021-mild increase in size of bilateral pulmonary metastases, stable subtle continuation right liver lesions Cycle 20 gemcitabine/Abraxane 01/03/2022 Cycle 21 gemcitabine/Abraxane 01/17/2022 Cycle 22 gemcitabine/Abraxane 01/31/2022 Cycle 23 gemcitabine/Abraxane 02/14/2022 Cycle 24 gemcitabine/Abraxane 02/28/2022 CTs 03/11/2022-widespread metastatic disease to the lungs again noted with slight involution of several of the pulmonary nodules, no definite new nodules noted; interval cavitation of solid lesion in the posterior aspect right lobe of the liver, no new liver lesions noted. Cycle 25 Gemcitabine/Abraxane 03/14/2022 Cycle 26 gemcitabine/Abraxane 03/28/2022 Cycle 27 gemcitabine/Abraxane 04/25/2022 Cycle 28 gemcitabine/Abraxane 05/09/2022 Cycle 29 Gemcitabine 05/23/2022, Abraxane held due to neuropathy CTs 06/04/2022-stable multifocal cavitary pulmonary nodules.  No definite new nodules.  No new nodules greater than a centimeter.  Decreased size of the right posterior hepatic lobe lesion.  Generalized heterogeneity  and new focal areas of hypodensity in the liver. Cycle 30 Gemcitabine 06/06/2022, Abraxane held due to neuropathy Cycle 31 gemcitabine 06/20/2022, Abraxane held due to neuropathy Cycle 32 gemcitabine 07/04/2022, Abraxane held due to neuropathy Cycle 33 gemcitabine  07/18/2022 ,Abraxane held due to neuropathy Cycle 34 gemcitabine 08/15/2022, Abraxane held due to neuropathy Cycle 35 gemcitabine 08/29/2022, Abraxane held due to neuropathy CTs 09/10/2022-enlarging pulmonary metastases.  Subtle poorly defined hypoattenuating lesions in the liver, less well-seen than on 06/04/2022.  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  Apixaban 10/24/2022      Disposition: Alyssa Ross appears stable.  She is tolerating the gemcitabine/Abraxane well.  She will complete another cycle today.  She will undergo a restaging chest CT after this cycle.  We will follow-up on the CA 19-9 from today.  She will  return for an office visit in 2 weeks.  Thornton Papas, MD  12/19/2022  10:00 AM

## 2022-12-19 NOTE — Progress Notes (Signed)
Patient seen by Dr. Truett Perna today  Vitals are within treatment parameters.OK to proceed w/pulse 56/min  Labs reviewed by Dr. Truett Perna and are within treatment parameters.  Per physician team, patient is ready for treatment and there are NO modifications to the treatment plan.

## 2022-12-19 NOTE — Patient Instructions (Signed)
[facility]   Discharge Instructions: Thank you for choosing [facility] to provide your oncology and hematology care.   If you have a lab appointment with the Cancer Center, please go directly to the Cancer Center and check in at the registration area.   Wear comfortable clothing and clothing appropriate for easy access to any Portacath or PICC line.   We strive to give you quality time with your provider. You may need to reschedule your appointment if you arrive late (15 or more minutes).  Arriving late affects you and other patients whose appointments are after yours.  Also, if you miss three or more appointments without notifying the office, you may be dismissed from the clinic at the provider's discretion.      For prescription refill requests, have your pharmacy contact our office and allow 72 hours for refills to be completed.    Today you received the following chemotherapy and/or immunotherapy agents Paclitaxel-protein bound (ABRAXANE) & Gemcitabine (GEMZAR).   To help prevent nausea and vomiting after your treatment, we encourage you to take your nausea medication as directed.  BELOW ARE SYMPTOMS THAT SHOULD BE REPORTED IMMEDIATELY: *FEVER GREATER THAN 100.4 F (38 C) OR HIGHER *CHILLS OR SWEATING *NAUSEA AND VOMITING THAT IS NOT CONTROLLED WITH YOUR NAUSEA MEDICATION *UNUSUAL SHORTNESS OF BREATH *UNUSUAL BRUISING OR BLEEDING *URINARY PROBLEMS (pain or burning when urinating, or frequent urination) *BOWEL PROBLEMS (unusual diarrhea, constipation, pain near the anus) TENDERNESS IN MOUTH AND THROAT WITH OR WITHOUT PRESENCE OF ULCERS (sore throat, sores in mouth, or a toothache) UNUSUAL RASH, SWELLING OR PAIN  UNUSUAL VAGINAL DISCHARGE OR ITCHING   Items with * indicate a potential emergency and should be followed up as soon as possible or go to the Emergency Department if any problems should occur.  Please show the CHEMOTHERAPY  ALERT CARD or IMMUNOTHERAPY ALERT CARD at check-in to the Emergency Department and triage nurse.  Should you have questions after your visit or need to cancel or reschedule your appointment, please contact [facility]  Dept: [phone]  and follow the prompts.  Office hours are 8:00 a.m. to 4:30 p.m. Monday - Friday. Please note that voicemails left after 4:00 p.m. may not be returned until the following business day.  We are closed weekends and major holidays. You have access to a nurse at all times for urgent questions. Please call the main number to the clinic Dept: [phone] and follow the prompts.   For any non-urgent questions, you may also contact your provider using MyChart. We now offer e-Visits for anyone 18 and older to request care online for non-urgent symptoms. For details visit [email or web address].   Also download the MyChart app! Go to the app store, search "MyChart", open the app, select , and log in with your MyChart username and password.  Paclitaxel Nanoparticle Albumin-Bound Injection What is this medication? NANOPARTICLE ALBUMIN-BOUND PACLITAXEL (Na no PAHR ti kuhl al BYOO muhn-bound PAK li TAX el) treats some types of cancer. It works by slowing down the growth of cancer cells. This medicine may be used for other purposes; ask your health care provider or pharmacist if you have questions. COMMON BRAND NAME(S): Abraxane What should I tell my care team before I take this medication? They need to know if you have any of these conditions: Liver disease Low white blood cell levels An unusual or allergic reaction to paclitaxel, albumin, other medications, foods, dyes, or preservatives   If you or your partner are pregnant or trying to get pregnant Breast-feeding How should I use this medication? This medication is injected into a vein. It is given by your care team in a hospital or clinic setting. Talk to your care team  about the use of this medication in children. Special care may be needed. Overdosage: If you think you have taken too much of this medicine contact a poison control center or emergency room at once. NOTE: This medicine is only for you. Do not share this medicine with others. What if I miss a dose? Keep appointments for follow-up doses. It is important not to miss your dose. Call your care team if you are unable to keep an appointment. What may interact with this medication? Other medications may affect the way this medication works. Talk with your care team about all of the medications you take. They may suggest changes to your treatment plan to lower the risk of side effects and to make sure your medications work as intended. This list may not describe all possible interactions. Give your health care provider a list of all the medicines, herbs, non-prescription drugs, or dietary supplements you use. Also tell them if you smoke, drink alcohol, or use illegal drugs. Some items may interact with your medicine. What should I watch for while using this medication? Your condition will be monitored carefully while you are receiving this medication. You may need blood work while taking this medication. This medication may make you feel generally unwell. This is not uncommon as chemotherapy can affect healthy cells as well as cancer cells. Report any side effects. Continue your course of treatment even though you feel ill unless your care team tells you to stop. This medication can cause serious allergic reactions. To reduce the risk, your care team may give you other medications to take before receiving this one. Be sure to follow the directions from your care team. This medication may increase your risk of getting an infection. Call your care team for advice if you get a fever, chills, sore throat, or other symptoms of a cold or flu. Do not treat yourself. Try to avoid being around people who are sick. This  medication may increase your risk to bruise or bleed. Call your care team if you notice any unusual bleeding. Be careful brushing or flossing your teeth or using a toothpick because you may get an infection or bleed more easily. If you have any dental work done, tell your dentist you are receiving this medication. Talk to your care team if you or your partner may be pregnant. Serious birth defects can occur if you take this medication during pregnancy and for 6 months after the last dose. You will need a negative pregnancy test before starting this medication. Contraception is recommended while taking this medication and for 6 months after the last dose. Your care team can help you find the option that works for you. If your partner can get pregnant, use a condom during sex while taking this medication and for 3 months after the last dose. Do not breastfeed while taking this medication and for 2 weeks after the last dose. This medication may cause infertility. Talk to your care team if you are concerned about your fertility. What side effects may I notice from receiving this medication? Side effects that you should report to your care team as soon as possible: Allergic reactions--skin rash, itching, hives, swelling of the face, lips, tongue, or throat Dry cough,   shortness of breath or trouble breathing Infection--fever, chills, cough, sore throat, wounds that don't heal, pain or trouble when passing urine, general feeling of discomfort or being unwell Low red blood cell level--unusual weakness or fatigue, dizziness, headache, trouble breathing Pain, tingling, or numbness in the hands or feet Stomach pain, unusual weakness or fatigue, nausea, vomiting, diarrhea, or fever that lasts longer than expected Unusual bruising or bleeding Side effects that usually do not require medical attention (report to your care team if they continue or are bothersome): Diarrhea Fatigue Hair loss Loss of  appetite Nausea Vomiting This list may not describe all possible side effects. Call your doctor for medical advice about side effects. You may report side effects to FDA at 1-800-FDA-1088. Where should I keep my medication? This medication is given in a hospital or clinic. It will not be stored at home. NOTE: This sheet is a summary. It may not cover all possible information. If you have questions about this medicine, talk to your doctor, pharmacist, or health care provider.  2024 Elsevier/Gold Standard (2021-10-11 00:00:00)  Gemcitabine Injection What is this medication? GEMCITABINE (jem SYE ta been) treats some types of cancer. It works by slowing down the growth of cancer cells. This medicine may be used for other purposes; ask your health care provider or pharmacist if you have questions. COMMON BRAND NAME(S): Gemzar, Infugem What should I tell my care team before I take this medication? They need to know if you have any of these conditions: Blood disorders Infection Kidney disease Liver disease Lung or breathing disease, such as asthma or COPD Recent or ongoing radiation therapy An unusual or allergic reaction to gemcitabine, other medications, foods, dyes, or preservatives If you or your partner are pregnant or trying to get pregnant Breast-feeding How should I use this medication? This medication is injected into a vein. It is given by your care team in a hospital or clinic setting. Talk to your care team about the use of this medication in children. Special care may be needed. Overdosage: If you think you have taken too much of this medicine contact a poison control center or emergency room at once. NOTE: This medicine is only for you. Do not share this medicine with others. What if I miss a dose? Keep appointments for follow-up doses. It is important not to miss your dose. Call your care team if you are unable to keep an appointment. What may interact with this  medication? Interactions have not been studied. This list may not describe all possible interactions. Give your health care provider a list of all the medicines, herbs, non-prescription drugs, or dietary supplements you use. Also tell them if you smoke, drink alcohol, or use illegal drugs. Some items may interact with your medicine. What should I watch for while using this medication? Your condition will be monitored carefully while you are receiving this medication. This medication may make you feel generally unwell. This is not uncommon, as chemotherapy can affect healthy cells as well as cancer cells. Report any side effects. Continue your course of treatment even though you feel ill unless your care team tells you to stop. In some cases, you may be given additional medications to help with side effects. Follow all directions for their use. This medication may increase your risk of getting an infection. Call your care team for advice if you get a fever, chills, sore throat, or other symptoms of a cold or flu. Do not treat yourself. Try to avoid being around   people who are sick. This medication may increase your risk to bruise or bleed. Call your care team if you notice any unusual bleeding. Be careful brushing or flossing your teeth or using a toothpick because you may get an infection or bleed more easily. If you have any dental work done, tell your dentist you are receiving this medication. Avoid taking medications that contain aspirin, acetaminophen, ibuprofen, naproxen, or ketoprofen unless instructed by your care team. These medications may hide a fever. Talk to your care team if you or your partner wish to become pregnant or think you might be pregnant. This medication can cause serious birth defects if taken during pregnancy and for 6 months after the last dose. A negative pregnancy test is required before starting this medication. A reliable form of contraception is recommended while taking  this medication and for 6 months after the last dose. Talk to your care team about effective forms of contraception. Do not father a child while taking this medication and for 3 months after the last dose. Use a condom while having sex during this time period. Do not breastfeed while taking this medication and for at least 1 week after the last dose. This medication may cause infertility. Talk to your care team if you are concerned about your fertility. What side effects may I notice from receiving this medication? Side effects that you should report to your care team as soon as possible: Allergic reactions--skin rash, itching, hives, swelling of the face, lips, tongue, or throat Capillary leak syndrome--stomach or muscle pain, unusual weakness or fatigue, feeling faint or lightheaded, decrease in the amount of urine, swelling of the ankles, hands, or feet, trouble breathing Infection--fever, chills, cough, sore throat, wounds that don't heal, pain or trouble when passing urine, general feeling of discomfort or being unwell Liver injury--right upper belly pain, loss of appetite, nausea, light-colored stool, dark yellow or brown urine, yellowing skin or eyes, unusual weakness or fatigue Low red blood cell level--unusual weakness or fatigue, dizziness, headache, trouble breathing Lung injury--shortness of breath or trouble breathing, cough, spitting up blood, chest pain, fever Stomach pain, bloody diarrhea, pale skin, unusual weakness or fatigue, decrease in the amount of urine, which may be signs of hemolytic uremic syndrome Sudden and severe headache, confusion, change in vision, seizures, which may be signs of posterior reversible encephalopathy syndrome (PRES) Unusual bruising or bleeding Side effects that usually do not require medical attention (report to your care team if they continue or are bothersome): Diarrhea Drowsiness Hair loss Nausea Pain, redness, or swelling with sores inside the  mouth or throat Vomiting This list may not describe all possible side effects. Call your doctor for medical advice about side effects. You may report side effects to FDA at 1-800-FDA-1088. Where should I keep my medication? This medication is given in a hospital or clinic. It will not be stored at home. NOTE: This sheet is a summary. It may not cover all possible information. If you have questions about this medicine, talk to your doctor, pharmacist, or health care provider.  2024 Elsevier/Gold Standard (2021-10-02 00:00:00)  

## 2022-12-20 ENCOUNTER — Other Ambulatory Visit: Payer: Self-pay

## 2022-12-21 LAB — CANCER ANTIGEN 19-9: CA 19-9: 170 U/mL — ABNORMAL HIGH (ref 0–35)

## 2022-12-30 ENCOUNTER — Ambulatory Visit (HOSPITAL_BASED_OUTPATIENT_CLINIC_OR_DEPARTMENT_OTHER)
Admission: RE | Admit: 2022-12-30 | Discharge: 2022-12-30 | Disposition: A | Discharge status: Home / Self Care | Payer: BC Managed Care – PPO | Source: Ambulatory Visit | Attending: Oncology | Admitting: Oncology

## 2022-12-30 ENCOUNTER — Encounter (HOSPITAL_BASED_OUTPATIENT_CLINIC_OR_DEPARTMENT_OTHER): Payer: Self-pay

## 2022-12-30 DIAGNOSIS — C259 Malignant neoplasm of pancreas, unspecified: Secondary | ICD-10-CM | POA: Diagnosis not present

## 2022-12-30 DIAGNOSIS — C250 Malignant neoplasm of head of pancreas: Secondary | ICD-10-CM | POA: Insufficient documentation

## 2022-12-30 DIAGNOSIS — K769 Liver disease, unspecified: Secondary | ICD-10-CM | POA: Diagnosis not present

## 2022-12-30 DIAGNOSIS — D259 Leiomyoma of uterus, unspecified: Secondary | ICD-10-CM | POA: Diagnosis not present

## 2022-12-30 DIAGNOSIS — K8689 Other specified diseases of pancreas: Secondary | ICD-10-CM | POA: Diagnosis not present

## 2022-12-30 MED ORDER — IOHEXOL 300 MG/ML  SOLN
100.0000 mL | Freq: Once | INTRAMUSCULAR | Status: AC | PRN
  Administered 2022-12-30: 80 mL via INTRAVENOUS

## 2023-01-02 ENCOUNTER — Inpatient Hospital Stay (HOSPITAL_BASED_OUTPATIENT_CLINIC_OR_DEPARTMENT_OTHER): Payer: BC Managed Care – PPO | Admitting: Oncology

## 2023-01-02 ENCOUNTER — Encounter: Payer: Self-pay | Admitting: Oncology

## 2023-01-02 ENCOUNTER — Other Ambulatory Visit: Payer: Self-pay

## 2023-01-02 ENCOUNTER — Inpatient Hospital Stay: Payer: BC Managed Care – PPO

## 2023-01-02 ENCOUNTER — Encounter: Payer: Self-pay | Admitting: *Deleted

## 2023-01-02 VITALS — BP 126/77 | HR 67 | Resp 18

## 2023-01-02 DIAGNOSIS — N6321 Unspecified lump in the left breast, upper outer quadrant: Secondary | ICD-10-CM | POA: Diagnosis not present

## 2023-01-02 DIAGNOSIS — I10 Essential (primary) hypertension: Secondary | ICD-10-CM | POA: Diagnosis not present

## 2023-01-02 DIAGNOSIS — Z5111 Encounter for antineoplastic chemotherapy: Secondary | ICD-10-CM | POA: Diagnosis not present

## 2023-01-02 DIAGNOSIS — C250 Malignant neoplasm of head of pancreas: Secondary | ICD-10-CM

## 2023-01-02 DIAGNOSIS — E1140 Type 2 diabetes mellitus with diabetic neuropathy, unspecified: Secondary | ICD-10-CM | POA: Diagnosis not present

## 2023-01-02 DIAGNOSIS — Z79899 Other long term (current) drug therapy: Secondary | ICD-10-CM | POA: Diagnosis not present

## 2023-01-02 DIAGNOSIS — C7802 Secondary malignant neoplasm of left lung: Secondary | ICD-10-CM | POA: Diagnosis not present

## 2023-01-02 DIAGNOSIS — C7801 Secondary malignant neoplasm of right lung: Secondary | ICD-10-CM | POA: Diagnosis not present

## 2023-01-02 LAB — CBC WITH DIFFERENTIAL (CANCER CENTER ONLY)
Abs Immature Granulocytes: 0.03 10*3/uL (ref 0.00–0.07)
Basophils Absolute: 0 10*3/uL (ref 0.0–0.1)
Basophils Relative: 0 %
Eosinophils Absolute: 0.1 10*3/uL (ref 0.0–0.5)
Eosinophils Relative: 1 %
HCT: 28.2 % — ABNORMAL LOW (ref 36.0–46.0)
Hemoglobin: 8.8 g/dL — ABNORMAL LOW (ref 12.0–15.0)
Immature Granulocytes: 0 %
Lymphocytes Relative: 10 %
Lymphs Abs: 0.8 10*3/uL (ref 0.7–4.0)
MCH: 28.8 pg (ref 26.0–34.0)
MCHC: 31.2 g/dL (ref 30.0–36.0)
MCV: 92.2 fL (ref 80.0–100.0)
Monocytes Absolute: 1.2 10*3/uL — ABNORMAL HIGH (ref 0.1–1.0)
Monocytes Relative: 14 %
Neutro Abs: 6.4 10*3/uL (ref 1.7–7.7)
Neutrophils Relative %: 75 %
Platelet Count: 287 10*3/uL (ref 150–400)
RBC: 3.06 MIL/uL — ABNORMAL LOW (ref 3.87–5.11)
RDW: 15.3 % (ref 11.5–15.5)
WBC Count: 8.6 10*3/uL (ref 4.0–10.5)
nRBC: 0 % (ref 0.0–0.2)

## 2023-01-02 LAB — CMP (CANCER CENTER ONLY)
ALT: 17 U/L (ref 0–44)
AST: 16 U/L (ref 15–41)
Albumin: 3.7 g/dL (ref 3.5–5.0)
Alkaline Phosphatase: 136 U/L — ABNORMAL HIGH (ref 38–126)
Anion gap: 5 (ref 5–15)
BUN: 8 mg/dL (ref 6–20)
CO2: 22 mmol/L (ref 22–32)
Calcium: 8.6 mg/dL — ABNORMAL LOW (ref 8.9–10.3)
Chloride: 108 mmol/L (ref 98–111)
Creatinine: 1.09 mg/dL — ABNORMAL HIGH (ref 0.44–1.00)
GFR, Estimated: 60 mL/min — ABNORMAL LOW (ref >60)
Glucose, Bld: 94 mg/dL (ref 70–99)
Potassium: 3.6 mmol/L (ref 3.5–5.1)
Sodium: 135 mmol/L (ref 135–145)
Total Bilirubin: 0.5 mg/dL (ref 0.3–1.2)
Total Protein: 7.5 g/dL (ref 6.5–8.1)

## 2023-01-02 LAB — MAGNESIUM: Magnesium: 1.7 mg/dL (ref 1.7–2.4)

## 2023-01-02 MED ORDER — PACLITAXEL PROTEIN-BOUND CHEMO INJECTION 100 MG
100.0000 mg/m2 | Freq: Once | INTRAVENOUS | Status: AC
  Administered 2023-01-02: 200 mg via INTRAVENOUS
  Filled 2023-01-02: qty 40

## 2023-01-02 MED ORDER — HEPARIN SOD (PORK) LOCK FLUSH 100 UNIT/ML IV SOLN
500.0000 [IU] | Freq: Once | INTRAVENOUS | Status: AC | PRN
  Administered 2023-01-02: 500 [IU]

## 2023-01-02 MED ORDER — SODIUM CHLORIDE 0.9 % IV SOLN
2000.0000 mg | Freq: Once | INTRAVENOUS | Status: AC
  Administered 2023-01-02: 2000 mg via INTRAVENOUS
  Filled 2023-01-02: qty 52.6

## 2023-01-02 MED ORDER — SODIUM CHLORIDE 0.9 % IV SOLN: Freq: Once | INTRAVENOUS | Status: AC

## 2023-01-02 MED ORDER — SODIUM CHLORIDE 0.9% FLUSH
10.0000 mL | INTRAVENOUS | Status: DC | PRN
  Administered 2023-01-02: 10 mL

## 2023-01-02 MED ORDER — PROCHLORPERAZINE MALEATE 10 MG PO TABS
10.0000 mg | ORAL_TABLET | Freq: Once | ORAL | Status: AC
  Administered 2023-01-02: 10 mg via ORAL
  Filled 2023-01-02: qty 1

## 2023-01-02 NOTE — Assessment & Plan Note (Addendum)
Chronic, well controlled. EKG performed, SB w/o acute changes. She will c/o spironolactone 25mg  and valsartan 160mg  daily. Encouraged to follow low sodium diet. She will f/u in six months.

## 2023-01-02 NOTE — Assessment & Plan Note (Signed)
Chronic, diabetic foot exam was performed. She is now followed by Endo at [facility]. Most recent notes reviewed in full detail.  She will f/u in six months. I DISCUSSED WITH THE PATIENT AT LENGTH REGARDING THE GOALS OF GLYCEMIC CONTROL AND POSSIBLE LONG-TERM COMPLICATIONS.  I  ALSO STRESSED THE IMPORTANCE OF COMPLIANCE WITH HOME GLUCOSE MONITORING, DIETARY RESTRICTIONS INCLUDING AVOIDANCE OF SUGARY DRINKS/PROCESSED FOODS,  ALONG WITH REGULAR EXERCISE.  I  ALSO STRESSED THE IMPORTANCE OF ANNUAL EYE EXAMS, SELF FOOT CARE AND COMPLIANCE WITH OFFICE VISITS.

## 2023-01-02 NOTE — Patient Instructions (Signed)
[facility]   Discharge Instructions: Thank you for choosing [facility] to provide your oncology and hematology care.   If you have a lab appointment with the Cancer Center, please go directly to the Cancer Center and check in at the registration area.   Wear comfortable clothing and clothing appropriate for easy access to any Portacath or PICC line.   We strive to give you quality time with your provider. You may need to reschedule your appointment if you arrive late (15 or more minutes).  Arriving late affects you and other patients whose appointments are after yours.  Also, if you miss three or more appointments without notifying the office, you may be dismissed from the clinic at the provider's discretion.      For prescription refill requests, have your pharmacy contact our office and allow 72 hours for refills to be completed.    Today you received the following chemotherapy and/or immunotherapy agents Paclitaxel-protein bound (ABRAXANE) & Gemcitabine (GEMZAR).   To help prevent nausea and vomiting after your treatment, we encourage you to take your nausea medication as directed.  BELOW ARE SYMPTOMS THAT SHOULD BE REPORTED IMMEDIATELY: *FEVER GREATER THAN 100.4 F (38 C) OR HIGHER *CHILLS OR SWEATING *NAUSEA AND VOMITING THAT IS NOT CONTROLLED WITH YOUR NAUSEA MEDICATION *UNUSUAL SHORTNESS OF BREATH *UNUSUAL BRUISING OR BLEEDING *URINARY PROBLEMS (pain or burning when urinating, or frequent urination) *BOWEL PROBLEMS (unusual diarrhea, constipation, pain near the anus) TENDERNESS IN MOUTH AND THROAT WITH OR WITHOUT PRESENCE OF ULCERS (sore throat, sores in mouth, or a toothache) UNUSUAL RASH, SWELLING OR PAIN  UNUSUAL VAGINAL DISCHARGE OR ITCHING   Items with * indicate a potential emergency and should be followed up as soon as possible or go to the Emergency Department if any problems should occur.  Please show the CHEMOTHERAPY  ALERT CARD or IMMUNOTHERAPY ALERT CARD at check-in to the Emergency Department and triage nurse.  Should you have questions after your visit or need to cancel or reschedule your appointment, please contact [facility]  Dept: [phone]  and follow the prompts.  Office hours are 8:00 a.m. to 4:30 p.m. Monday - Friday. Please note that voicemails left after 4:00 p.m. may not be returned until the following business day.  We are closed weekends and major holidays. You have access to a nurse at all times for urgent questions. Please call the main number to the clinic Dept: [phone] and follow the prompts.   For any non-urgent questions, you may also contact your provider using MyChart. We now offer e-Visits for anyone 18 and older to request care online for non-urgent symptoms. For details visit [email or web address].   Also download the MyChart app! Go to the app store, search "MyChart", open the app, select [facility], and log in with your MyChart username and password.  Paclitaxel Nanoparticle Albumin-Bound Injection What is this medication? NANOPARTICLE ALBUMIN-BOUND PACLITAXEL (Na no PAHR ti kuhl al BYOO muhn-bound PAK li TAX el) treats some types of cancer. It works by slowing down the growth of cancer cells. This medicine may be used for other purposes; ask your health care provider or pharmacist if you have questions. COMMON BRAND NAME(S): Abraxane What should I tell my care team before I take this medication? They need to know if you have any of these conditions: Liver disease Low white blood cell levels An unusual or allergic reaction to paclitaxel, albumin, other medications, foods, dyes, or preservatives   If you or your partner are pregnant or trying to get pregnant Breast-feeding How should I use this medication? This medication is injected into a vein. It is given by your care team in a hospital or clinic setting. Talk to your care team  about the use of this medication in children. Special care may be needed. Overdosage: If you think you have taken too much of this medicine contact a poison control center or emergency room at once. NOTE: This medicine is only for you. Do not share this medicine with others. What if I miss a dose? Keep appointments for follow-up doses. It is important not to miss your dose. Call your care team if you are unable to keep an appointment. What may interact with this medication? Other medications may affect the way this medication works. Talk with your care team about all of the medications you take. They may suggest changes to your treatment plan to lower the risk of side effects and to make sure your medications work as intended. This list may not describe all possible interactions. Give your health care provider a list of all the medicines, herbs, non-prescription drugs, or dietary supplements you use. Also tell them if you smoke, drink alcohol, or use illegal drugs. Some items may interact with your medicine. What should I watch for while using this medication? Your condition will be monitored carefully while you are receiving this medication. You may need blood work while taking this medication. This medication may make you feel generally unwell. This is not uncommon as chemotherapy can affect healthy cells as well as cancer cells. Report any side effects. Continue your course of treatment even though you feel ill unless your care team tells you to stop. This medication can cause serious allergic reactions. To reduce the risk, your care team may give you other medications to take before receiving this one. Be sure to follow the directions from your care team. This medication may increase your risk of getting an infection. Call your care team for advice if you get a fever, chills, sore throat, or other symptoms of a cold or flu. Do not treat yourself. Try to avoid being around people who are sick. This  medication may increase your risk to bruise or bleed. Call your care team if you notice any unusual bleeding. Be careful brushing or flossing your teeth or using a toothpick because you may get an infection or bleed more easily. If you have any dental work done, tell your dentist you are receiving this medication. Talk to your care team if you or your partner may be pregnant. Serious birth defects can occur if you take this medication during pregnancy and for 6 months after the last dose. You will need a negative pregnancy test before starting this medication. Contraception is recommended while taking this medication and for 6 months after the last dose. Your care team can help you find the option that works for you. If your partner can get pregnant, use a condom during sex while taking this medication and for 3 months after the last dose. Do not breastfeed while taking this medication and for 2 weeks after the last dose. This medication may cause infertility. Talk to your care team if you are concerned about your fertility. What side effects may I notice from receiving this medication? Side effects that you should report to your care team as soon as possible: Allergic reactions--skin rash, itching, hives, swelling of the face, lips, tongue, or throat Dry cough,   shortness of breath or trouble breathing Infection--fever, chills, cough, sore throat, wounds that don't heal, pain or trouble when passing urine, general feeling of discomfort or being unwell Low red blood cell level--unusual weakness or fatigue, dizziness, headache, trouble breathing Pain, tingling, or numbness in the hands or feet Stomach pain, unusual weakness or fatigue, nausea, vomiting, diarrhea, or fever that lasts longer than expected Unusual bruising or bleeding Side effects that usually do not require medical attention (report to your care team if they continue or are bothersome): Diarrhea Fatigue Hair loss Loss of  appetite Nausea Vomiting This list may not describe all possible side effects. Call your doctor for medical advice about side effects. You may report side effects to FDA at 1-800-FDA-1088. Where should I keep my medication? This medication is given in a hospital or clinic. It will not be stored at home. NOTE: This sheet is a summary. It may not cover all possible information. If you have questions about this medicine, talk to your doctor, pharmacist, or health care provider.  2024 Elsevier/Gold Standard (2021-10-11 00:00:00)  Gemcitabine Injection What is this medication? GEMCITABINE (jem SYE ta been) treats some types of cancer. It works by slowing down the growth of cancer cells. This medicine may be used for other purposes; ask your health care provider or pharmacist if you have questions. COMMON BRAND NAME(S): Gemzar, Infugem What should I tell my care team before I take this medication? They need to know if you have any of these conditions: Blood disorders Infection Kidney disease Liver disease Lung or breathing disease, such as asthma or COPD Recent or ongoing radiation therapy An unusual or allergic reaction to gemcitabine, other medications, foods, dyes, or preservatives If you or your partner are pregnant or trying to get pregnant Breast-feeding How should I use this medication? This medication is injected into a vein. It is given by your care team in a hospital or clinic setting. Talk to your care team about the use of this medication in children. Special care may be needed. Overdosage: If you think you have taken too much of this medicine contact a poison control center or emergency room at once. NOTE: This medicine is only for you. Do not share this medicine with others. What if I miss a dose? Keep appointments for follow-up doses. It is important not to miss your dose. Call your care team if you are unable to keep an appointment. What may interact with this  medication? Interactions have not been studied. This list may not describe all possible interactions. Give your health care provider a list of all the medicines, herbs, non-prescription drugs, or dietary supplements you use. Also tell them if you smoke, drink alcohol, or use illegal drugs. Some items may interact with your medicine. What should I watch for while using this medication? Your condition will be monitored carefully while you are receiving this medication. This medication may make you feel generally unwell. This is not uncommon, as chemotherapy can affect healthy cells as well as cancer cells. Report any side effects. Continue your course of treatment even though you feel ill unless your care team tells you to stop. In some cases, you may be given additional medications to help with side effects. Follow all directions for their use. This medication may increase your risk of getting an infection. Call your care team for advice if you get a fever, chills, sore throat, or other symptoms of a cold or flu. Do not treat yourself. Try to avoid being around   people who are sick. This medication may increase your risk to bruise or bleed. Call your care team if you notice any unusual bleeding. Be careful brushing or flossing your teeth or using a toothpick because you may get an infection or bleed more easily. If you have any dental work done, tell your dentist you are receiving this medication. Avoid taking medications that contain aspirin, acetaminophen, ibuprofen, naproxen, or ketoprofen unless instructed by your care team. These medications may hide a fever. Talk to your care team if you or your partner wish to become pregnant or think you might be pregnant. This medication can cause serious birth defects if taken during pregnancy and for 6 months after the last dose. A negative pregnancy test is required before starting this medication. A reliable form of contraception is recommended while taking  this medication and for 6 months after the last dose. Talk to your care team about effective forms of contraception. Do not father a child while taking this medication and for 3 months after the last dose. Use a condom while having sex during this time period. Do not breastfeed while taking this medication and for at least 1 week after the last dose. This medication may cause infertility. Talk to your care team if you are concerned about your fertility. What side effects may I notice from receiving this medication? Side effects that you should report to your care team as soon as possible: Allergic reactions--skin rash, itching, hives, swelling of the face, lips, tongue, or throat Capillary leak syndrome--stomach or muscle pain, unusual weakness or fatigue, feeling faint or lightheaded, decrease in the amount of urine, swelling of the ankles, hands, or feet, trouble breathing Infection--fever, chills, cough, sore throat, wounds that don't heal, pain or trouble when passing urine, general feeling of discomfort or being unwell Liver injury--right upper belly pain, loss of appetite, nausea, light-colored stool, dark yellow or brown urine, yellowing skin or eyes, unusual weakness or fatigue Low red blood cell level--unusual weakness or fatigue, dizziness, headache, trouble breathing Lung injury--shortness of breath or trouble breathing, cough, spitting up blood, chest pain, fever Stomach pain, bloody diarrhea, pale skin, unusual weakness or fatigue, decrease in the amount of urine, which may be signs of hemolytic uremic syndrome Sudden and severe headache, confusion, change in vision, seizures, which may be signs of posterior reversible encephalopathy syndrome (PRES) Unusual bruising or bleeding Side effects that usually do not require medical attention (report to your care team if they continue or are bothersome): Diarrhea Drowsiness Hair loss Nausea Pain, redness, or swelling with sores inside the  mouth or throat Vomiting This list may not describe all possible side effects. Call your doctor for medical advice about side effects. You may report side effects to FDA at 1-800-FDA-1088. Where should I keep my medication? This medication is given in a hospital or clinic. It will not be stored at home. NOTE: This sheet is a summary. It may not cover all possible information. If you have questions about this medicine, talk to your doctor, pharmacist, or health care provider.  2024 Elsevier/Gold Standard (2021-10-02 00:00:00)  

## 2023-01-02 NOTE — Progress Notes (Signed)
Alyssa Ross OFFICE PROGRESS NOTE   Diagnosis: Pancreas cancer  INTERVAL HISTORY:   Alyssa Ross completed another cycle of gemcitabine/Abraxane on 12/19/2022.  No fever.  The hyperpigmented rash remains improved.  No change in neuropathy symptoms.  No new complaint.  Objective:  Vital signs in last 24 hours:  Blood pressure 118/77, pulse 85, temperature 98.1 F (36.7 C), temperature source Oral, resp. rate 18, height 5\' 7"  (1.702 m), weight 196 lb (88.9 kg), last menstrual period 09/17/2012, SpO2 100%.    HEENT: No thrush or ulcers Resp: Lungs clear bilaterally Cardio: Regular rate and rhythm GI: No hepatosplenomegaly Vascular: No leg edema Skin: Hyperpigmented rash over the face  Portacath/PICC-without erythema  Lab Results:  Lab Results  Component Value Date   WBC 8.6 01/02/2023   HGB 8.8 (L) 01/02/2023   HCT 28.2 (L) 01/02/2023   MCV 92.2 01/02/2023   PLT 287 01/02/2023   NEUTROABS 6.4 01/02/2023    CMP  Lab Results  Component Value Date   NA 135 12/19/2022   K 3.8 12/19/2022   CL 108 12/19/2022   CO2 22 12/19/2022   GLUCOSE 253 (H) 12/19/2022   BUN 9 12/19/2022   CREATININE 1.10 (H) 12/19/2022   CALCIUM 8.7 (L) 12/19/2022   PROT 6.9 12/19/2022   ALBUMIN 3.7 12/19/2022   AST 22 12/19/2022   ALT 22 12/19/2022   ALKPHOS 193 (H) 12/19/2022   BILITOT 0.4 12/19/2022   GFRNONAA 59 (L) 12/19/2022   GFRAA >60 02/07/2020    Lab Results  Component Value Date   CAN199 170 (H) 12/19/2022      Imaging:  CT CHEST ABDOMEN PELVIS W CONTRAST  Result Date: 01/01/2023 CLINICAL DATA:  Pancreatic cancer. Status post pancreatic duodenectomy 03/04/2019. * Tracking Code: BO * EXAM: CT CHEST, ABDOMEN, AND PELVIS WITH CONTRAST TECHNIQUE: Multidetector CT imaging of the chest, abdomen and pelvis was performed following the standard protocol during bolus administration of intravenous contrast. RADIATION DOSE REDUCTION: This exam was performed according to the  departmental dose-optimization program which includes automated exposure control, adjustment of the mA and/or kV according to patient size and/or use of iterative reconstruction technique. CONTRAST:  80mL OMNIPAQUE IOHEXOL 300 MG/ML  SOLN COMPARISON:  None Available. FINDINGS: CT CHEST FINDINGS Cardiovascular: Port in the anterior chest wall with tip in distal SVC. No significant vascular findings. Normal heart size. No pericardial effusion. Mediastinum/Nodes: No axillary or supraclavicular adenopathy. No mediastinal or hilar adenopathy. No pericardial fluid. Esophagus normal. Lungs/Pleura: Multiple round cavitary nodules consistent pulmonary metastasis. No appreciable change in number or size of lesions. No new lesions identified. Example lesion in the RIGHT lower lobe measures 19 mm compared to 18 mm (1/4) example lesion in the superior segment of the LEFT lower lobe measures 11 mm (image 44) compared to 11 mm. LEFT lobe nodule measures 13 mm (88) compared to 12 mm. Musculoskeletal: No aggressive osseous lesion. CT ABDOMEN AND PELVIS FINDINGS Hepatobiliary: New low-density lesion in the central LEFT hepatic lobe with thin enhancing rim measures 27 mm x 18 mm (44/2). Lesion the posterior RIGHT hepatic lobe is less well-defined measuring 22 by 24 mm (image 48) compared to 25 x 26 mm. There is scattered pneumobilia in LEFT and RIGHT hepatic lobe. Indistinct tissue planes through the porta hepatis. No evidence of biliary obstruction. Pancreas: Partial pancreatectomy resection of pancreatic head. No pancreatic duct dilatation or mass. There is atrophy of the pancreas similar prior. Pancreatic stent in place. Spleen: Normal spleen Adrenals/urinary tract: Adrenal glands and  kidneys are normal. The ureters and bladder normal. Stomach/Bowel: Partial gastrectomy. Small bowel normal. Colon normal. Vascular/Lymphatic: No retroperitoneal adenopathy. No upper abdominal adenopathy. No periportal adenopathy. Reproductive:  Multiple round enhancing leiomyoma of the uterus unchanged. Other: No peritoneal or omental metastasis identified. Musculoskeletal: No aggressive osseous lesion. IMPRESSION: CHEST: Stable bilateral cavitary pulmonary metastasis. No mediastinal lymphadenopathy. PELVIS: 1. New low-density lesion in the LEFT hepatic lobe. Differential includes hepatic abscess versus new hepatic metastasis 2. New pneumobilia presumably related to choledochoenterostomy 3. Partial pancreatectomy and gastrectomy. 4. No evidence of local recurrence in the pancreatic bed. 5. No evidence of metastatic adenopathy in the abdomen pelvis. These results will be called to the ordering clinician or representative by the Radiologist Assistant, and communication documented in the PACS or [employer]. Electronically Signed   By: Genevive Bi M.D.   On: 01/01/2023 15:28    Medications: I have reviewed the patient's current medications.   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 Tumor invades the duodenal wall and vascular groove, resection margins negative, 4/34 lymph nodes positive MSI-stable, tumor showed instability in 2 loci as did adjacent normal tissue Foundation 1-K-ras G12 V, microsatellite status and tumor mutation burden cannot be determined EUS FNA biopsy of pancreas mass on 07/03/2018-well-differentiated neuroendocrine tumor CTs 01/29/2019-ill-defined pancreas head mass, 5 pulmonary nodules-1 with a small amount of central cavitation, tumor abuts the left margin of the portal vein indistinct density surrounding, hepatic artery, complex cystic lesion of the right kidney, right adrenal mass-characterized as an adenoma on a [facility] MRI 12/21/2018 [facility] 03/03/2019-no focal pancreas activity, no tracer accumulation within the suspicious pulmonary nodules, left uterine mass with tracer accumulation felt to represent a leiomyoma Elevated preoperative CA 19-9--CA 19-9  186 on 01/18/2019 CT chest 04/16/2019-multiple bilateral pulmonary nodules, some with increased cavitation, stable in size Cycle 1 FOLFIRINOX 04/27/2019 Cycle 2 FOLFIRINOX 05/11/2019 Cycle 3 FOLFIRINOX 05/23/2019 Cycle 4 FOLFIRINOX 06/08/2019 Cycle 5 FOLFIRINOX 06/22/2019 CT chest 07/02/2019-stable size of bilateral pulmonary nodules.  Dominant cavitary lesions in both lungs show increased cavitation with thinner walls.  Stable 2.1 cm right adrenal nodule. 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 No definite evidence of metastatic disease in the abdomen or pelvis. Markedly enlarged and heterogeneous appearing uterus, likely to represent multifocal fibroids. 1 of these lesions appears to be an exophytic subserosal fibroid in the posterior lateral aspect of the uterine body on the left side although this comes in close proximity to the left adnexa such that a primary ovarian lesion is difficult to completely exclude. CTs 02/07/2020-slight enlargement of bilateral lung nodules, some are cavitary, no evidence of metastatic disease in the abdomen or pelvis, stable right adrenal nodule, uterine fibroids CTs 04/26/2020-mild enlargement of pulmonary nodules, slight increase in trace pelvic fluid, new soft tissue thickening inferior to the cecal tip suspicious for peritoneal metastasis CT 05/26/2020-improved appearance of soft tissue at the inferior tip of the cecum, mildly thickened short appendix-findings suggestive of resolving appendicitis, stable small bibasilar pulmonary nodules, fibroids Plan  biopsy of right cecal tip soft tissue canceled secondary to radiologic improvement CT chest 08/02/2020-enlargement and progressive cavitation of multiple bilateral lung nodules.  Some new nodules are present.  CTs 10/24/2020- increase in size of pulmonary nodules, no new nodules, no evidence of metastatic disease in the abdomen, stable right adrenal nodule CT 01/09/2021-slight interval enlargement of pulmonary nodules, stable right adrenal nodule Navigation bronchoscopy 01/30/2021-left lower lobe cavitary nodule FNA-adenocarcinoma, brushing-adenocarcinoma.  Left lower lobe lavage-adenocarcinoma.  Right upper lobe brushing and FNA biopsy of cavitary nodule-adenocarcinoma-immunohistochemical profile consistent with pancreas adenocarcinoma Cycle 1 gemcitabine/Abraxane 03/28/2021 Cycle 2 gemcitabine/Abraxane 04/11/2021 Cycle 3 gemcitabine/Abraxane 04/25/2021 Cycle 4 gemcitabine/Abraxane 05/09/2021 Cycle 5 gemcitabine/Abraxane 05/23/2021 CT chest 06/05/2021-interval cavitation of multiple pulmonary nodules, some nodules have decreased in size, no new or enlarging nodules Cycle 6 gemcitabine/Abraxane 06/06/2021 Cycle 7 gemcitabine/Abraxane 06/21/2021 Cycle 8 gemcitabine/Abraxane 07/05/2021 Cycle 9 Gemcitabine/Abraxane 07/19/2021 Cycle 10 gemcitabine 08/01/2021-Abraxane held secondary to neuropathy CT chest 08/13/2021-mild decrease in size and wall thickness of multiple cavitary nodules, no new or progressive disease in the chest, indeterminate low-attenuation right liver lesions Cycle 11 gemcitabine 08/16/2021-Abraxane held secondary to neuropathy Cycle 12 gemcitabine 08/30/2021-Abraxane held secondary to neuropathy Cycle 13 gemcitabine 09/13/2021-Abraxane held secondary to neuropathy Cycle 14 gemcitabine 09/27/2021-Abraxane held secondary to neuropathy Cycle 15 gemcitabine 10/11/2021-Abraxane held secondary to neuropathy CTs 10/22/2021-no change in multiple cavitary lung nodules, no evidence of disease progression,  ill-defined hypodense lesion in the posterior right liver suspicious for metastatic disease Cycle 16 gemcitabine 10/25/2021 Cycle 17 gemcitabine 11/08/2021 Cycle 18 Gemcitabine 11/22/2021 Cycle 19 gemcitabine 12/06/2021 Cycle 20 Gemcitabine 12/20/2021 CT 12/31/2021-mild increase in size of bilateral pulmonary metastases, stable subtle continuation right liver lesions Cycle 20 gemcitabine/Abraxane 01/03/2022 Cycle 21 gemcitabine/Abraxane 01/17/2022 Cycle 22 gemcitabine/Abraxane 01/31/2022 Cycle 23 gemcitabine/Abraxane 02/14/2022 Cycle 24 gemcitabine/Abraxane 02/28/2022 CTs 03/11/2022-widespread metastatic disease to the lungs again noted with slight involution of several of the pulmonary nodules, no definite new nodules noted; interval cavitation of solid lesion in the posterior aspect right lobe of the liver, no new liver lesions noted. Cycle 25 Gemcitabine/Abraxane 03/14/2022 Cycle 26 gemcitabine/Abraxane 03/28/2022 Cycle 27 gemcitabine/Abraxane 04/25/2022 Cycle 28 gemcitabine/Abraxane 05/09/2022 Cycle 29 Gemcitabine 05/23/2022, Abraxane held due to neuropathy CTs 06/04/2022-stable multifocal cavitary pulmonary nodules.  No definite new nodules.  No new nodules greater than a centimeter.  Decreased size of the right posterior hepatic lobe lesion.  Generalized heterogeneity and new focal areas of hypodensity in the liver. Cycle 30 Gemcitabine 06/06/2022, Abraxane held due to neuropathy Cycle 31 gemcitabine 06/20/2022, Abraxane held due to neuropathy Cycle 32 gemcitabine 07/04/2022, Abraxane held due to neuropathy Cycle 33 gemcitabine  07/18/2022 ,Abraxane held due to neuropathy Cycle 34 gemcitabine 08/15/2022, Abraxane held due to neuropathy Cycle 35 gemcitabine 08/29/2022, Abraxane held due to neuropathy CTs 09/10/2022-enlarging pulmonary metastases.  Subtle poorly defined hypoattenuating lesions in the liver, less well-seen than on 06/04/2022.  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  Apixaban 10/24/2022       Disposition: Alyssa Ross has metastatic pancreas cancer.  I reviewed the restaging CT findings and images with Alyssa Ross.  The metastatic lung lesions appear unchanged.  There appears to be a new central left liver lesion.  It is unclear whether this lesion represents a metastasis, infection, or another etiology.  The dominant right liver lesion appears smaller.  I will present her case at the GI tumor conference to discuss the new lesion and the indication for the biopsy procedure.  The plan is to continue gemcitabine/Abraxane.  She will return for an office visit and chemotherapy in 2 weeks.  Thornton Papas, MD  01/02/2023  10:16 AM

## 2023-01-02 NOTE — Progress Notes (Signed)
Patient seen by Dr. Sherrill today ? ?Vitals are within treatment parameters. ? ?Labs reviewed by Dr. Sherrill and are within treatment parameters. ? ?Per physician team, patient is ready for treatment and there are NO modifications to the treatment plan.  ?

## 2023-01-02 NOTE — Assessment & Plan Note (Signed)
A full exam was performed, encouraged to perform monthly self breast exams. Advised by oncologist that screenings for breast/colon/cervical CA is no longer needed due to her current condition.  PATIENT IS ADVISED TO GET 30-45 MINUTES REGULAR EXERCISE NO LESS THAN FOUR TO FIVE DAYS PER WEEK - BOTH WEIGHTBEARING EXERCISES AND AEROBIC ARE RECOMMENDED.  PATIENT IS ADVISED TO FOLLOW A HEALTHY DIET WITH AT LEAST SIX FRUITS/VEGGIES PER DAY, DECREASE INTAKE OF RED MEAT, AND TO INCREASE FISH INTAKE TO TWO DAYS PER WEEK.  MEATS/FISH SHOULD NOT BE FRIED, BAKED OR BROILED IS PREFERABLE.  IT IS ALSO IMPORTANT TO CUT BACK ON YOUR SUGAR INTAKE. PLEASE AVOID ANYTHING WITH ADDED SUGAR, CORN SYRUP OR OTHER SWEETENERS. IF YOU MUST USE A SWEETENER, YOU CAN TRY STEVIA. IT IS ALSO IMPORTANT TO AVOID ARTIFICIALLY SWEETENERS AND DIET BEVERAGES. LASTLY, I SUGGEST WEARING SPF 50 SUNSCREEN ON EXPOSED PARTS AND ESPECIALLY WHEN IN THE DIRECT SUNLIGHT FOR AN EXTENDED PERIOD OF TIME.  PLEASE AVOID FAST FOOD RESTAURANTS AND INCREASE YOUR WATER INTAKE.

## 2023-01-02 NOTE — Assessment & Plan Note (Addendum)
Chronic, sx have improved since she is no longer on Oxaliplatin.

## 2023-01-07 DIAGNOSIS — E119 Type 2 diabetes mellitus without complications: Secondary | ICD-10-CM | POA: Diagnosis not present

## 2023-01-07 DIAGNOSIS — Z794 Long term (current) use of insulin: Secondary | ICD-10-CM | POA: Diagnosis not present

## 2023-01-08 ENCOUNTER — Other Ambulatory Visit: Payer: Self-pay

## 2023-01-08 NOTE — Progress Notes (Signed)
The proposed treatment discussed in conference is for discussion purpose only and is not a binding recommendation.  The patients have not been physically examined, or presented with their treatment options.  Therefore, final treatment plans cannot be decided.  

## 2023-01-09 ENCOUNTER — Other Ambulatory Visit: Payer: Self-pay

## 2023-01-09 ENCOUNTER — Telehealth: Payer: Self-pay | Admitting: *Deleted

## 2023-01-09 NOTE — Telephone Encounter (Signed)
Per Dr. Truett Perna: CT reviewed at GI Conference and radiologist and other providers agree the liver lesion appears to be an abscess. It is recommended to have US drainage + biopsy by IR.  Alyssa Ross agrees to this plan.

## 2023-01-10 ENCOUNTER — Ambulatory Visit
Admission: RE | Admit: 2023-01-10 | Discharge: 2023-01-10 | Disposition: A | Discharge status: Home / Self Care | Payer: BC Managed Care – PPO | Source: Ambulatory Visit | Attending: Internal Medicine | Admitting: Internal Medicine

## 2023-01-10 VITALS — BP 128/73 | HR 90 | Temp 98.6°F | Resp 16

## 2023-01-10 DIAGNOSIS — K6289 Other specified diseases of anus and rectum: Secondary | ICD-10-CM

## 2023-01-10 NOTE — ED Triage Notes (Signed)
Pt reports hemorrhoids on and off x 3 months. Preparation H gives relief. Pt reports she think she may have a blood clot in the hemorrhoids.  Pt is having chemotherapy pancreatic cancer.

## 2023-01-10 NOTE — ED Provider Notes (Signed)
[facility]    CSN: [id number] Arrival date & time: 01/10/23  1848      History   Chief Complaint Chief Complaint  Patient presents with   Hemorrhoids    Entered by patient    HPI Alyssa Ross is a 57 y.o. female.   Patient presents to urgent care for evaluation of hemorrhoid that started 2-3 months. Same hemorrhoid over and over, will return.  States there is a "knot" to the hemorrhoid this time and is concerned for possible blood clot. States hemorrhoid bleeds, then reduces in size and cycle starts over. She feels like symptoms are "about to start acting up again". Hemorrhoid is non-tender and is slightly itchy. She sometimes sees blood on toilet tissue, no blood in the stools. No abdominal pain, N/V/D, dizziness, or fever/chills. She has never seen GI for this problem in the past and has never had a colonoscopy. She is currently undergoing treatment for pancreatic cancer. Using OTC hemorrhoid cream without relief.      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   Yeast vaginitis 2006    Patient Active Problem List   Diagnosis Date Noted   Encounter for general adult medical examination w/o abnormal findings 03/02/2021   Lung nodules 01/04/2021   Cavitary lesion of lung 11/15/2020   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   Port-A-Cath in place 04/27/2019    Pancreatic cancer (HCC) 03/04/2019   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   Genetic testing 02/23/2019   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   Family history of pancreatic cancer    Essential hypertension 12/22/2012   Pure hypercholesterolemia 12/22/2012   Uncontrolled type 2 diabetes mellitus with hyperglycemia, with long-term current use of insulin (HCC) 12/17/2012    Past Surgical History:  Procedure Laterality Date   BILIARY STENT PLACEMENT N/A 12/25/2018   Procedure: BILIARY STENT PLACEMENT;  Surgeon: Jeani Hawking, MD;  Location: [facility];  Service: Endoscopy;  Laterality: N/A;   BILIARY STENT PLACEMENT N/A 01/01/2019   Procedure: BILIARY STENT PLACEMENT;  Surgeon: Jeani Hawking, MD;  Location: [facility];  Service: Endoscopy;  Laterality: N/A;   BRONCHIAL BIOPSY  01/30/2021   Procedure: BRONCHIAL BIOPSIES;  Surgeon: Josephine Igo, DO;  Location: [facility];  Service: Pulmonary;;   BRONCHIAL BRUSHINGS  01/30/2021   Procedure: BRONCHIAL BRUSHINGS;  Surgeon: Josephine Igo, DO;  Location: [facility];  Service: Pulmonary;;   BRONCHIAL NEEDLE ASPIRATION BIOPSY  01/30/2021   Procedure: BRONCHIAL NEEDLE ASPIRATION BIOPSIES;  Surgeon: Josephine Igo, DO;  [facility]: [facility];  Service: Pulmonary;;   BRONCHIAL WASHINGS  01/30/2021   Procedure: BRONCHIAL WASHINGS;  Surgeon: Josephine Igo, DO;  [facility];  Service: Pulmonary;;   DILATATION & CURETTAGE/HYSTEROSCOPY WITH TRUECLEAR N/A 01/20/2014   Procedure: DILATATION & CURETTAGE/HYSTEROSCOPY WITH TRUCLEAR;  Surgeon: Samule Ohm  Tillman Abide, MD;  Location: [facility];  Service: Gynecology;  Laterality: N/A;   DILATATION & CURRETTAGE/HYSTEROSCOPY WITH RESECTOCOPE N/A 01/20/2014   Procedure: DILATATION & CURETTAGE/HYSTEROSCOPY WITH RESECTOCOPE;  Surgeon: Michael Litter, MD;  Location: [facility];  Service: Gynecology;  Laterality: N/A;   ENDOSCOPIC RETROGRADE  CHOLANGIOPANCREATOGRAPHY (ERCP) WITH PROPOFOL N/A 12/25/2018   Procedure: ENDOSCOPIC RETROGRADE CHOLANGIOPANCREATOGRAPHY (ERCP) WITH PROPOFOL;  Surgeon: Jeani Hawking, MD;  [facility]: [facility];  Service: Endoscopy;  Laterality: N/A;   ENDOSCOPIC RETROGRADE CHOLANGIOPANCREATOGRAPHY (ERCP) WITH PROPOFOL N/A 01/01/2019   Procedure: ENDOSCOPIC RETROGRADE CHOLANGIOPANCREATOGRAPHY (ERCP) WITH PROPOFOL;  Surgeon: Jeani Hawking, MD;  Location: [facility];  Service: Endoscopy;  Laterality: N/A;   ERCP  12/25/2018   FINE NEEDLE ASPIRATION N/A 01/01/2019   Procedure: FINE NEEDLE ASPIRATION (FNA) LINEAR;  Surgeon: Jeani Hawking, MD;  Location: [facility];  Service: Endoscopy;  Laterality: N/A;   LAPAROSCOPY N/A 03/04/2019   Procedure: LAPAROSCOPY DIAGNOSTIC;  Surgeon: Almond Lint, MD;  [facility];  Service: General;  Laterality: N/A;  GENERAL AND EPIDURAL   PARTIAL NEPHRECTOMY Right 03/04/2019   Procedure: Nephrectomy Partial;  Surgeon: Malen Gauze, MD;  [facility];  Service: Urology;  Laterality: Right;   PORTACATH PLACEMENT Left 04/21/2019   Procedure: INSERTION PORT-A-CATH;  Surgeon: Almond Lint, MD;  [facility];  Service: General;  Laterality: Left;   SPHINCTEROTOMY  12/25/2018   Procedure: Dennison Mascot;  Surgeon: Jeani Hawking, MD;  [facility]: [facility];  Service: Endoscopy;;   STENT REMOVAL  01/01/2019   Procedure: STENT REMOVAL;  Surgeon: Jeani Hawking, MD;  [facility]: [facility];  Service: Endoscopy;;   UPPER ESOPHAGEAL ENDOSCOPIC ULTRASOUND (EUS) N/A 01/01/2019   Procedure: UPPER ESOPHAGEAL ENDOSCOPIC ULTRASOUND (EUS);  Surgeon: Jeani Hawking, MD;  Location: [facility];  Service: Endoscopy;  Laterality: N/A;   VIDEO BRONCHOSCOPY WITH ENDOBRONCHIAL NAVIGATION Bilateral 01/30/2021   Procedure: VIDEO BRONCHOSCOPY WITH ENDOBRONCHIAL NAVIGATION;  Surgeon: Josephine Igo, DO;  [facility];  Service: Pulmonary;  Laterality: Bilateral;  ION   VIDEO  BRONCHOSCOPY WITH RADIAL ENDOBRONCHIAL ULTRASOUND  01/30/2021   Procedure: RADIAL ENDOBRONCHIAL ULTRASOUND;  Surgeon: Josephine Igo, DO;  [facility];  Service: Pulmonary;;   WHIPPLE PROCEDURE N/A 03/04/2019   Procedure: WHIPPLE PROCEDURE;  Surgeon: Almond Lint, MD;  [facility];  Service: General;  Laterality: N/A;  GENERAL AND EPIDURAL    OB History   No obstetric history on file.      Home Medications    Prior to Admission medications   Medication Sig Start Date End Date Taking? Authorizing Provider  apixaban (ELIQUIS) 5 MG TABS tablet Take 1 tablet (5 mg total) by mouth 2 (two) times daily. 11/11/22   Ladene Artist, MD  cetirizine (ZYRTEC) 10 MG tablet Take 10 mg by mouth daily.    [provider]  Continuous Blood Gluc Receiver (FREESTYLE LIBRE 14 DAY READER) DEVI USE READER TO CHECK BLOOD GLUCOSE WITH FREESTYLE LIBRE SENSORS. 12/25/17   Reather Littler, MD  Continuous Blood Gluc Sensor (FREESTYLE LIBRE 2 SENSOR) MISC APPLY EVERY 14 DAYS 09/25/20   Reather Littler, MD  doxycycline (VIBRA-TABS) 100 MG tablet Take 1 tablet (100 mg total) by mouth daily. Patient not taking: Reported on 12/17/2022 11/26/22   Terri Piedra, DO  glucose blood test strip Check sugar 2 times daily. 03/21/20   Reather Littler, MD  Insulin Degludec (TRESIBA FLEXTOUCH [place]) Inject into the skin. 12-18 Units    [provider]  insulin lispro (HUMALOG KWIKPEN) 200 UNIT/ML  KwikPen 6 units with breakfast, 13 units with lunch, 16 units with dinner 05/17/21   [provider]  Insulin Pen Needle (BD PEN NEEDLE NANO U/F) 32G X 4 MM MISC USE TO INJECT INSULIN THREE TIMES A DAY 02/19/21   Reather Littler, MD  lidocaine-prilocaine (EMLA) cream Apply 1 Application topically as directed. Apply 1 hour prior to stick and cover with plastic wrap 03/28/22   Rana Snare, NP  magic mouthwash SOLN Take 5 mLs by mouth 4 (four) times daily as needed for mouth pain. Patient not taking: Reported on  12/19/2022 06/27/21   Ladene Artist, MD  magnesium oxide (MAG-OX) 400 (240 Mg) MG tablet TAKE 1 TABLET(400 MG) BY MOUTH TWICE DAILY 09/12/22   Rana Snare, NP  metroNIDAZOLE (METROCREAM) 0.75 % cream Apply 1 Application topically 2 (two) times daily. To face Patient not taking: Reported on 12/17/2022 03/09/22   [provider]  potassium chloride SA (KLOR-CON M) 20 MEQ tablet Take 1 tablet (20 mEq total) by mouth 2 (two) times daily. 09/12/22   Rana Snare, NP  prochlorperazine (COMPAZINE) 10 MG tablet TAKE 1 TABLET(10 MG) BY MOUTH EVERY 6 HOURS AS NEEDED FOR NAUSEA OR VOMITING Patient not taking: Reported on 12/19/2022 04/25/22   Ladene Artist, MD  rosuvastatin (CRESTOR) 10 MG tablet TAKE 1 TABLET AT BEDTIME 12/02/22   Dorothyann Peng, MD  spironolactone (ALDACTONE) 25 MG tablet Take 1 tablet (25 mg total) by mouth daily. 02/21/22 02/21/23  Dorothyann Peng, MD  tretinoin (RETIN-A) 0.05 % cream Apply topically at bedtime. 06/21/22   [provider]  valsartan (DIOVAN) 160 MG tablet TAKE 1 TABLET BY MOUTH TWICE DAILY 07/17/22   Dorothyann Peng, MD    Family History Family History  Problem Relation Age of Onset   Diabetes Mother    Dementia Mother    Hyperlipidemia Mother    Hypertension Mother    Irregular heart beat Mother    Diabetes Father    Hypertension Father    Hyperlipidemia Father    Down syndrome Sister    Diabetes Sister    Hyperlipidemia Brother    Heart Problems Brother    Goiter Maternal Aunt    Thyroid nodules Sister    Cancer Cousin 60       eye; maternal first cousin   Goiter Cousin    Cancer Paternal Aunt        unknown form of cancer   Cancer Cousin        unknown form of cancer; paternal first cousin   Pancreatic cancer Cousin 29       paternal first cousin   Cancer Cousin 40       unknown cancer; paternal first cousin   Cancer Cousin 39       unknown cancer; paternal first cousin    Social History Social History   Tobacco Use   Smoking  status: Never   Smokeless tobacco: Never  Vaping Use   Vaping status: Never Used  Substance Use Topics   Alcohol use: Not Currently   Drug use: No     Allergies   Cherry and Lemon oil   Review of Systems Review of Systems Per HPI  Physical Exam Triage Vital Signs ED Triage Vitals [01/10/23 1912]  Encounter Vitals Group     BP 128/73     Systolic BP Percentile      Diastolic BP Percentile      Pulse Rate 90     Resp  16     Temp 98.6 F (37 C)     Temp Source Oral     SpO2 94 %     Weight      Height      Head Circumference      Peak Flow      Pain Score      Pain Loc      Pain Education      Exclude from Growth Chart    No data found.  Updated Vital Signs BP 128/73 (BP Location: Right Arm)   Pulse 90   Temp 98.6 F (37 C) (Oral)   Resp 16   LMP 09/17/2012   SpO2 94%   Visual Acuity Right Eye Distance:   Left Eye Distance:   Bilateral Distance:    Right Eye Near:   Left Eye Near:    Bilateral Near:     Physical Exam Vitals and nursing note reviewed.  Constitutional:      Appearance: She is not ill-appearing or toxic-appearing.  HENT:     Head: Normocephalic and atraumatic.     Right Ear: Hearing and external ear normal.     Left Ear: Hearing and external ear normal.     Nose: Nose normal.     Mouth/Throat:     Lips: Pink.  Eyes:     General: Lids are normal. Vision grossly intact. Gaze aligned appropriately.     Extraocular Movements: Extraocular movements intact.     Conjunctiva/sclera: Conjunctivae normal.  Pulmonary:     Effort: Pulmonary effort is normal.  Genitourinary:    Comments: Rectal cyst to the 9 o'clock position that is the size of a pea and firm to palpation Area of erythema as it comes to a head with exposed skin capable of bleeding There is a nonthrombosed and nonbleeding hemorrhoid to the 6 o'clock position of the rectum. Musculoskeletal:     Cervical back: Neck supple.  Skin:    General: Skin is warm and dry.      Capillary Refill: Capillary refill takes less than 2 seconds.     Findings: No rash.  Neurological:     General: No focal deficit present.     Mental Status: She is alert and oriented to person, place, and time. Mental status is at baseline.     Cranial Nerves: No dysarthria or facial asymmetry.  Psychiatric:        Mood and Affect: Mood normal.        Speech: Speech normal.        Behavior: Behavior normal.        Thought Content: Thought content normal.        Judgment: Judgment normal.      UC Treatments / Results  Labs (all labs ordered are listed, but only abnormal results are displayed) Labs Reviewed - No data to display  EKG   Radiology No results found.  Procedures Procedures (including critical care time)  Medications Ordered in UC Medications - No data to display  Initial Impression / Assessment and Plan / UC Course  I have reviewed the triage vital signs and the nursing notes.  Pertinent labs & imaging results that were available during my care of the patient were reviewed by me and considered in my medical decision making (see chart for details).   1. Rectal cyst Area of concern does not appear to be hemorrhoid in nature but rather cystic. Warm compresses recommended. I would like for patient to follow-up with  GI and oncology for further assessment of this to monitor size and bleeding further. May continue OTC hemorrhoid cream for hemorrhoid noted to exam.  Counseled patient on potential for adverse effects with medications prescribed/recommended today, strict ER and return-to-clinic precautions discussed, patient verbalized understanding.   Final Clinical Impressions(s) / UC Diagnoses   Final diagnoses:  Rectal cyst     Discharge Instructions      You have what I believed to be a rectal cyst.  Please apply warm compresses to the area to reduce swelling, itch, and bleeding.  Please mention this to your oncologist and follow-up on this with your  primary care provider.  If you notice any pus coming out of the cyst or the cyst becomes more painful, swollen, or warm to touch, I would like for you to be seen quickly.  Return to urgent care as needed.     ED Prescriptions   None    PDMP not reviewed this encounter.   Reita May [place], [place] 01/20/23 [phone]

## 2023-01-10 NOTE — Discharge Instructions (Addendum)
You have what I believed to be a rectal cyst.  Please apply warm compresses to the area to reduce swelling, itch, and bleeding.  Please mention this to your oncologist and follow-up on this with your primary care provider.  If you notice any pus coming out of the cyst or the cyst becomes more painful, swollen, or warm to touch, I would like for you to be seen quickly.  Return to urgent care as needed.

## 2023-01-13 ENCOUNTER — Other Ambulatory Visit: Payer: Self-pay | Admitting: *Deleted

## 2023-01-13 ENCOUNTER — Encounter: Payer: Self-pay | Admitting: General Practice

## 2023-01-13 ENCOUNTER — Encounter: Payer: Self-pay | Admitting: Oncology

## 2023-01-13 DIAGNOSIS — C250 Malignant neoplasm of head of pancreas: Secondary | ICD-10-CM

## 2023-01-13 NOTE — Progress Notes (Signed)
Oley Balm, MD  Caroleen Hamman, NT Ok to sched CT bx/aspiration possible drain placement + mod sedation Thx DDH

## 2023-01-13 NOTE — Progress Notes (Signed)
Oley Balm, MD  Caroleen Hamman, NT PROCEDURE / BIOPSY REVIEW Date: 01/13/23  Requested Biopsy site: liver abscess drain Reason for request: Per Dr. Truett Perna: CT reviewed at GI Conference and radiologist and other providers agree the liver lesion appears to be an abscess. It is recommended to have US drainage + biopsy by IR. Ms. Shroff agrees to this plan.  Imaging review: Best seen on CT 12/30/22  Decision: Approved Imaging modality to perform: CT Schedule with: Moderate Sedation Schedule for: Any VIR  Additional comments: @VIR : aspirate/drain liver abscess  Please contact me with questions, concerns, or if issue pertaining to this request arise.  Dayne Oley Balm, MD [facility]       Previous Messages    ----- Message ----- From: Caroleen Hamman, NT Sent: 01/13/2023  10:42 AM EDT To: Ir Procedure Requests Subject: US Biopsy Liver                                Procedure: US Biopsy Liver  Reason: Liver abscess on CT  History: CT in chart  Provider: Ladene Artist, MD  Contact: [phone]

## 2023-01-15 ENCOUNTER — Other Ambulatory Visit: Payer: Self-pay | Admitting: Nurse Practitioner

## 2023-01-15 ENCOUNTER — Other Ambulatory Visit: Payer: Self-pay | Admitting: Oncology

## 2023-01-16 ENCOUNTER — Inpatient Hospital Stay: Payer: BC Managed Care – PPO

## 2023-01-16 ENCOUNTER — Inpatient Hospital Stay: Payer: BC Managed Care – PPO | Admitting: Nurse Practitioner

## 2023-01-16 ENCOUNTER — Inpatient Hospital Stay: Payer: BC Managed Care – PPO | Admitting: Oncology

## 2023-01-17 ENCOUNTER — Other Ambulatory Visit: Payer: Self-pay

## 2023-01-17 ENCOUNTER — Encounter: Payer: Self-pay | Admitting: *Deleted

## 2023-01-17 NOTE — Progress Notes (Signed)
Scheduler called patient to reschedule her missed appointment. Alyssa Ross decided to skip this treatment and will come on 01/30/23 as scheduled.

## 2023-01-20 ENCOUNTER — Encounter: Payer: Self-pay | Admitting: Oncology

## 2023-01-21 ENCOUNTER — Telehealth: Payer: Self-pay

## 2023-01-21 ENCOUNTER — Encounter: Payer: Self-pay | Admitting: General Practice

## 2023-01-21 ENCOUNTER — Telehealth (HOSPITAL_COMMUNITY): Payer: Self-pay | Admitting: Radiology

## 2023-01-21 NOTE — Telephone Encounter (Signed)
My chart message sent instead.  

## 2023-01-21 NOTE — Progress Notes (Signed)
Ladene Artist, MD  Caroleen Hamman, NT Yes       Previous Messages    ----- Message ----- From: Caroleen Hamman, [place] Sent: 01/20/2023   1:04 PM EDT To: Ladene Artist, MD Subject: Biopsy                                        Good Afternoon, The above pt is schedule for her CT guided drainage/biopsy and will need to hold her Eliquis for 48 hrs prior to procedure. Can we have your permission to hold this medication for the above pt.    Thanks  [employer]

## 2023-01-21 NOTE — Telephone Encounter (Signed)
Received a call from Darl Pikes with Dr. Kalman Drape office. This patient is scheduled for a CT biopsy on 8/19. She has diabetes and is concerned about her sugars being low the morning of her procedure due to having to be NPO after midnight the night before. I spoke to Dr. Oley Balm, MD. Per Dr. Deanne Coffer the patient may take 1/2 of her normal morning insulin dose prior to this CT biopsy. I called Dr. Kalman Drape office and gave them that information on 01/21/23. JM

## 2023-01-24 ENCOUNTER — Other Ambulatory Visit: Payer: Self-pay | Admitting: Radiology

## 2023-01-24 DIAGNOSIS — K769 Liver disease, unspecified: Secondary | ICD-10-CM

## 2023-01-26 ENCOUNTER — Encounter (HOSPITAL_COMMUNITY): Payer: Self-pay

## 2023-01-26 NOTE — H&P (Signed)
Chief Complaint: Left hepatic lobe lesion. Request is for left hepatic lobe lesion biopsy for further evaluation of metastasis, infection or other etiology.  Referring Physician(s): Ladene Artist  Supervising Physician: Irish Lack  Patient Status: [facility] - Out-pt  History of Present Illness: Alyssa Ross is a 57 y.o. female  outpatient. History of HTN. 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 Found to have a hepatic lesion on CT. CT CAP from 7.22.24 reads New low-density lesion in the LEFT hepatic lobe. Differential includes hepatic abscess versus new hepatic metastasis. Team is requesting aspiration possible drain placement for further evaluation of metastasis, infection or other etiology. Case reviewed and approved by IR Attending Dr. Algis Downs D. Hassell.   Sister at bedside. Currently without any significant complaints. Patient alert and laying in bed,calm. Denies any fevers, headache, chest pain, SOB, cough, abdominal pain, nausea, vomiting or bleeding. Return precautions and treatment recommendations and follow-up discussed with the patient  and her sister. Both who are agreeable with the plan.    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   Yeast vaginitis 2006    Past Surgical History:  Procedure Laterality Date   BILIARY  STENT PLACEMENT N/A 12/25/2018   Procedure: BILIARY STENT PLACEMENT;  Surgeon: Jeani Hawking, MD;  Location: [facility];  Service: Endoscopy;  Laterality: N/A;   BILIARY STENT PLACEMENT N/A 01/01/2019   Procedure: BILIARY STENT PLACEMENT;  Surgeon: Jeani Hawking, MD;  Location: [facility];  Service: Endoscopy;  Laterality: N/A;   BRONCHIAL BIOPSY  01/30/2021   Procedure: BRONCHIAL BIOPSIES;  Surgeon: Josephine Igo, DO;  Location: [facility];  Service: Pulmonary;;   BRONCHIAL BRUSHINGS  01/30/2021   Procedure: BRONCHIAL BRUSHINGS;  Surgeon: Josephine Igo, DO;  Location: [facility];  Service: Pulmonary;;   BRONCHIAL NEEDLE ASPIRATION BIOPSY  01/30/2021   Procedure: BRONCHIAL NEEDLE ASPIRATION BIOPSIES;  Surgeon: Josephine Igo, DO;  Location: [facility];  Service: Pulmonary;;   BRONCHIAL WASHINGS  01/30/2021   Procedure: BRONCHIAL WASHINGS;  Surgeon: Josephine Igo, DO;  [facility]: [facility];  Service: Pulmonary;;   DILATATION & CURETTAGE/HYSTEROSCOPY WITH TRUECLEAR N/A 01/20/2014   Procedure: DILATATION & CURETTAGE/HYSTEROSCOPY WITH TRUCLEAR;  Surgeon: Michael Litter, MD;  Location: [facility];  Service: Gynecology;  Laterality: N/A;   DILATATION & CURRETTAGE/HYSTEROSCOPY WITH RESECTOCOPE N/A 01/20/2014   Procedure: DILATATION & CURETTAGE/HYSTEROSCOPY WITH RESECTOCOPE;  Surgeon: Michael Litter, MD;  Location: [facility];  Service: Gynecology;  Laterality: N/A;   ENDOSCOPIC RETROGRADE CHOLANGIOPANCREATOGRAPHY (ERCP) WITH PROPOFOL N/A 12/25/2018   Procedure: ENDOSCOPIC RETROGRADE CHOLANGIOPANCREATOGRAPHY (ERCP) WITH PROPOFOL;  Surgeon: Jeani Hawking, MD;  Location: [facility];  Service: Endoscopy;  Laterality: N/A;   ENDOSCOPIC RETROGRADE CHOLANGIOPANCREATOGRAPHY (ERCP) WITH PROPOFOL N/A 01/01/2019   Procedure: ENDOSCOPIC RETROGRADE CHOLANGIOPANCREATOGRAPHY (ERCP) WITH PROPOFOL;  Surgeon: Jeani Hawking, MD;  Location: [facility];  Service: Endoscopy;  Laterality: N/A;   ERCP   12/25/2018   FINE NEEDLE ASPIRATION N/A 01/01/2019   Procedure: FINE NEEDLE ASPIRATION (FNA) LINEAR;  Surgeon: Jeani Hawking, MD;  Location: [facility];  Service: Endoscopy;  Laterality: N/A;   LAPAROSCOPY N/A 03/04/2019   Procedure: LAPAROSCOPY DIAGNOSTIC;  Surgeon: Almond Lint, MD;  Location: [facility];  Service: General;  Laterality: N/A;  GENERAL AND EPIDURAL   PARTIAL NEPHRECTOMY Right 03/04/2019   Procedure: Nephrectomy Partial;  Surgeon: Malen Gauze, MD;  Location: [facility];  Service: Urology;  Laterality: Right;   PORTACATH PLACEMENT Left 04/21/2019   Procedure: INSERTION PORT-A-CATH;  Surgeon: Almond Lint, MD;  Location: [facility];  Service: General;  Laterality: Left;   SPHINCTEROTOMY  12/25/2018   Procedure: Dennison Mascot;  Surgeon: Jeani Hawking, MD;  Location: [facility];  Service: Endoscopy;;   STENT REMOVAL  01/01/2019   Procedure: STENT REMOVAL;  Surgeon: Jeani Hawking, MD;  [facility]: [facility];  Service: Endoscopy;;   UPPER ESOPHAGEAL ENDOSCOPIC ULTRASOUND (EUS) N/A 01/01/2019   Procedure: UPPER ESOPHAGEAL ENDOSCOPIC ULTRASOUND (EUS);  Surgeon: Jeani Hawking, MD;  Location: [facility];  Service: Endoscopy;  Laterality: N/A;   VIDEO BRONCHOSCOPY WITH ENDOBRONCHIAL NAVIGATION Bilateral 01/30/2021   Procedure: VIDEO BRONCHOSCOPY WITH ENDOBRONCHIAL NAVIGATION;  Surgeon: Josephine Igo, DO;  [facility]: [facility];  Service: Pulmonary;  Laterality: Bilateral;  ION   VIDEO BRONCHOSCOPY WITH RADIAL ENDOBRONCHIAL ULTRASOUND  01/30/2021   Procedure: RADIAL ENDOBRONCHIAL ULTRASOUND;  Surgeon: Josephine Igo, DO;  [facility];  Service: Pulmonary;;   WHIPPLE PROCEDURE N/A 03/04/2019   Procedure: WHIPPLE PROCEDURE;  Surgeon: Almond Lint, MD;  [facility];  Service: General;  Laterality: N/A;  GENERAL AND EPIDURAL    Allergies: Cherry and Lemon oil  Medications: Prior to Admission medications   Medication Sig Start Date End Date Taking? Authorizing  Provider  apixaban (ELIQUIS) 5 MG TABS tablet Take 1 tablet (5 mg total) by mouth 2 (two) times daily. 11/11/22   Ladene Artist, MD  cetirizine (ZYRTEC) 10 MG tablet Take 10 mg by mouth daily.    [provider]  Continuous Blood Gluc Receiver (FREESTYLE LIBRE 14 DAY READER) DEVI USE READER TO CHECK BLOOD GLUCOSE WITH FREESTYLE LIBRE SENSORS. 12/25/17   Reather Littler, MD  Continuous Blood Gluc Sensor (FREESTYLE LIBRE 2 SENSOR) MISC APPLY EVERY 14 DAYS 09/25/20   Reather Littler, MD  doxycycline (VIBRA-TABS) 100 MG tablet Take 1 tablet (100 mg total) by mouth daily. Patient not taking: Reported on 12/17/2022 11/26/22   Terri Piedra, DO  glucose blood test strip Check sugar 2 times daily. 03/21/20   Reather Littler, MD  Insulin Degludec (TRESIBA FLEXTOUCH [place]) Inject into the skin. 12-18 Units    [provider]  insulin lispro (HUMALOG KWIKPEN) 200 UNIT/ML KwikPen 6 units with breakfast, 13 units with lunch, 16 units with dinner 05/17/21   [provider]  Insulin Pen Needle (BD PEN NEEDLE NANO U/F) 32G X 4 MM MISC USE TO INJECT INSULIN THREE TIMES A DAY 02/19/21   Reather Littler, MD  lidocaine-prilocaine (EMLA) cream Apply 1 Application topically as directed. Apply 1 hour prior to stick and cover with plastic wrap 03/28/22   Rana Snare, NP  magic mouthwash SOLN Take 5 mLs by mouth 4 (four) times daily as needed for mouth pain. Patient not taking: Reported on 12/19/2022 06/27/21   Ladene Artist, MD  magnesium oxide (MAG-OX) 400 (240 Mg) MG tablet TAKE 1  TABLET(400 MG) BY MOUTH TWICE DAILY 09/12/22   Rana Snare, NP  metroNIDAZOLE (METROCREAM) 0.75 % cream Apply 1 Application topically 2 (two) times daily. To face Patient not taking: Reported on 12/17/2022 03/09/22   [provider]  potassium chloride SA (KLOR-CON M) 20 MEQ tablet Take 1 tablet (20 mEq total) by mouth 2 (two) times daily. 09/12/22   Rana Snare, NP  prochlorperazine (COMPAZINE) 10 MG tablet TAKE 1 TABLET(10  MG) BY MOUTH EVERY 6 HOURS AS NEEDED FOR NAUSEA OR VOMITING Patient not taking: Reported on 12/19/2022 04/25/22   Ladene Artist, MD  rosuvastatin (CRESTOR) 10 MG tablet TAKE 1 TABLET AT BEDTIME 12/02/22   Dorothyann Peng, MD  spironolactone (ALDACTONE) 25 MG tablet Take 1 tablet (25 mg total) by mouth daily. 02/21/22 02/21/23  Dorothyann Peng, MD  tretinoin (RETIN-A) 0.05 % cream Apply topically at bedtime. 06/21/22   [provider]  valsartan (DIOVAN) 160 MG tablet TAKE 1 TABLET BY MOUTH TWICE DAILY 07/17/22   Dorothyann Peng, MD     Family History  Problem Relation Age of Onset   Diabetes Mother    Dementia Mother    Hyperlipidemia Mother    Hypertension Mother    Irregular heart beat Mother    Diabetes Father    Hypertension Father    Hyperlipidemia Father    Down syndrome Sister    Diabetes Sister    Hyperlipidemia Brother    Heart Problems Brother    Goiter Maternal Aunt    Thyroid nodules Sister    Cancer Cousin 60       eye; maternal first cousin   Goiter Cousin    Cancer Paternal Aunt        unknown form of cancer   Cancer Cousin        unknown form of cancer; paternal first cousin   Pancreatic cancer Cousin 14       paternal first cousin   Cancer Cousin 41       unknown cancer; paternal first cousin   Cancer Cousin 32       unknown cancer; paternal first cousin    Social History   Socioeconomic History   Marital status: Single    Spouse name: Not on file   Number of children: Not on file   Years of education: Not on file   Highest education level: Not on file  Occupational History   Not on file  Tobacco Use   Smoking status: Never   Smokeless tobacco: Never  Vaping Use   Vaping status: Never Used  Substance and Sexual Activity   Alcohol use: Not Currently   Drug use: No   Sexual activity: Yes    Birth control/protection: Post-menopausal  Other Topics Concern   Not on file  Social History Narrative   Not on file   Social Determinants of  Health   Financial Resource Strain: Not on file  Food Insecurity: No Food Insecurity (04/05/2022)   Hunger Vital Sign    Worried About Running Out of Food in the Last Year: Never true    Ran Out of Food in the Last Year: Never true  Transportation Needs: No Transportation Needs (04/05/2022)   PRAPARE - Administrator, Civil Service (Medical): No    Lack of Transportation (Non-Medical): No  Physical Activity: Not on file  Stress: Not on file  Social Connections: Unknown (08/27/2022)   Received from [facility], [facility]   Social Network  Social Network: Not on file    Review of Systems: A 12 point ROS discussed and pertinent positives are indicated in the HPI above.  All other systems are negative.  Review of Systems  Constitutional:  Negative for fatigue and fever.  HENT:  Negative for congestion.   Respiratory:  Negative for cough and shortness of breath.   Gastrointestinal:  Negative for abdominal pain, diarrhea, nausea and vomiting.    Vital Signs: BP 117/72   Pulse (!) 59   Temp 98.5 F (36.9 C) (Temporal)   Resp 17   Ht 5\' 8"  (1.727 m)   Wt 195 lb (88.5 kg)   LMP 09/17/2012   SpO2 100%   BMI 29.65 kg/m     Physical Exam Vitals and nursing note reviewed.  Constitutional:      Appearance: She is well-developed.  HENT:     Head: Normocephalic and atraumatic.  Eyes:     Conjunctiva/sclera: Conjunctivae normal.  Cardiovascular:     Rate and Rhythm: Regular rhythm. Bradycardia present.  Pulmonary:     Effort: Pulmonary effort is normal.  Musculoskeletal:        General: Normal range of motion.     Cervical back: Normal range of motion.  Skin:    General: Skin is warm and dry.  Neurological:     General: No focal deficit present.     Mental Status: She is alert and oriented to person, place, and time.  Psychiatric:        Mood and Affect: Mood normal.        Behavior: Behavior normal.        Thought Content: Thought content normal.         Judgment: Judgment normal.     Imaging: CT CHEST ABDOMEN PELVIS W CONTRAST  Result Date: 01/01/2023 CLINICAL DATA:  Pancreatic cancer. Status post pancreatic duodenectomy 03/04/2019. * Tracking Code: BO * EXAM: CT CHEST, ABDOMEN, AND PELVIS WITH CONTRAST TECHNIQUE: Multidetector CT imaging of the chest, abdomen and pelvis was performed following the standard protocol during bolus administration of intravenous contrast. RADIATION DOSE REDUCTION: This exam was performed according to the departmental dose-optimization program which includes automated exposure control, adjustment of the mA and/or kV according to patient size and/or use of iterative reconstruction technique. CONTRAST:  80mL OMNIPAQUE IOHEXOL 300 MG/ML  SOLN COMPARISON:  None Available. FINDINGS: CT CHEST FINDINGS Cardiovascular: Port in the anterior chest wall with tip in distal SVC. No significant vascular findings. Normal heart size. No pericardial effusion. Mediastinum/Nodes: No axillary or supraclavicular adenopathy. No mediastinal or hilar adenopathy. No pericardial fluid. Esophagus normal. Lungs/Pleura: Multiple round cavitary nodules consistent pulmonary metastasis. No appreciable change in number or size of lesions. No new lesions identified. Example lesion in the RIGHT lower lobe measures 19 mm compared to 18 mm (1/4) example lesion in the superior segment of the LEFT lower lobe measures 11 mm (image 44) compared to 11 mm. LEFT lobe nodule measures 13 mm (88) compared to 12 mm. Musculoskeletal: No aggressive osseous lesion. CT ABDOMEN AND PELVIS FINDINGS Hepatobiliary: New low-density lesion in the central LEFT hepatic lobe with thin enhancing rim measures 27 mm x 18 mm (44/2). Lesion the posterior RIGHT hepatic lobe is less well-defined measuring 22 by 24 mm (image 48) compared to 25 x 26 mm. There is scattered pneumobilia in LEFT and RIGHT hepatic lobe. Indistinct tissue planes through the porta hepatis. No evidence of biliary  obstruction. Pancreas: Partial pancreatectomy resection of pancreatic head. No pancreatic duct dilatation  or mass. There is atrophy of the pancreas similar prior. Pancreatic stent in place. Spleen: Normal spleen Adrenals/urinary tract: Adrenal glands and kidneys are normal. The ureters and bladder normal. Stomach/Bowel: Partial gastrectomy. Small bowel normal. Colon normal. Vascular/Lymphatic: No retroperitoneal adenopathy. No upper abdominal adenopathy. No periportal adenopathy. Reproductive: Multiple round enhancing leiomyoma of the uterus unchanged. Other: No peritoneal or omental metastasis identified. Musculoskeletal: No aggressive osseous lesion. IMPRESSION: CHEST: Stable bilateral cavitary pulmonary metastasis. No mediastinal lymphadenopathy. PELVIS: 1. New low-density lesion in the LEFT hepatic lobe. Differential includes hepatic abscess versus new hepatic metastasis 2. New pneumobilia presumably related to choledochoenterostomy 3. Partial pancreatectomy and gastrectomy. 4. No evidence of local recurrence in the pancreatic bed. 5. No evidence of metastatic adenopathy in the abdomen pelvis. These results will be called to the ordering clinician or representative by the Radiologist Assistant, and communication documented in the PACS or [employer]. Electronically Signed   By: Genevive Bi M.D.   On: 01/01/2023 15:28    Labs:  CBC: Recent Labs    12/05/22 0951 12/19/22 0912 01/02/23 0940 01/27/23 0833  WBC 4.5 5.7 8.6 5.9  HGB 8.9* 8.7* 8.8* 9.7*  HCT 28.6* 28.3* 28.2* 32.2*  PLT 294 420* 287 482*    COAGS: Recent Labs    01/27/23 0833  INR 1.0    BMP: Recent Labs    11/21/22 0901 12/05/22 0951 12/19/22 0912 01/02/23 0940  NA 137 138 135 135  K 3.6 3.8 3.8 3.6  CL 107 108 108 108  CO2 25 24 22 22   GLUCOSE 169* 176* 253* 94  BUN 21* 8 9 8   CALCIUM 8.4* 8.4* 8.7* 8.6*  CREATININE 1.03* 1.05* 1.10* 1.09*  GFRNONAA >60 >60 59* 60*    LIVER FUNCTION TESTS: Recent  Labs    11/21/22 0901 12/05/22 0951 12/19/22 0912 01/02/23 0940  BILITOT 0.4 0.4 0.4 0.5  AST 20 18 22 16   ALT 21 22 22 17   ALKPHOS 129* 177* 193* 136*  PROT 6.8 7.0 6.9 7.5  ALBUMIN 3.4* 3.5 3.7 3.7    Assessment and Plan:  57 y.o. female outpatient. History of HTN. 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 Found to have a hepatic lesion on CT. CT CAP from 7.22.24 reads New low-density lesion in the LEFT hepatic lobe. Differential includes hepatic abscess versus new hepatic metastasis. Team is requesting aspiration possible drain placement of further evaluation of metastasis, infection or other etiology. Case reviewed and approved by IR Attending Dr. Algis Downs D. Hassell.   All labs are within acceptable parameters. Patient is on eliquis. Last dose given on 8.18.24 No pertinent allergies. Patient has been NPO since midnight.   Risks and benefits of left hepatic lesion was discussed with the patient and/or patient's family including, but not limited to bleeding, infection, damage to adjacent structures or low yield requiring additional tests.  All of the questions were answered and there is agreement to proceed.  Consent signed and in chart.   Thank you for this interesting consult.  I greatly enjoyed meeting Samnang Pili and look forward to participating in their care.  A copy of this report was sent to the requesting provider on this date.  Electronically Signed: Alene Mires, NP 01/27/2023, 9:08 AM   I spent a total of  30 Minutes   in face to face in clinical consultation, greater than 50% of which was counseling/coordinating care for left hepatic lobe aspiration - biopsy

## 2023-01-27 ENCOUNTER — Other Ambulatory Visit: Payer: Self-pay

## 2023-01-27 ENCOUNTER — Ambulatory Visit (HOSPITAL_COMMUNITY)
Admission: RE | Admit: 2023-01-27 | Discharge: 2023-01-27 | Disposition: A | Discharge status: Home / Self Care | Payer: BC Managed Care – PPO | Source: Ambulatory Visit | Attending: Oncology | Admitting: Oncology

## 2023-01-27 ENCOUNTER — Other Ambulatory Visit: Payer: Self-pay | Admitting: Oncology

## 2023-01-27 DIAGNOSIS — C787 Secondary malignant neoplasm of liver and intrahepatic bile duct: Secondary | ICD-10-CM | POA: Insufficient documentation

## 2023-01-27 DIAGNOSIS — Z794 Long term (current) use of insulin: Secondary | ICD-10-CM | POA: Insufficient documentation

## 2023-01-27 DIAGNOSIS — B179 Acute viral hepatitis, unspecified: Secondary | ICD-10-CM | POA: Diagnosis not present

## 2023-01-27 DIAGNOSIS — Z7901 Long term (current) use of anticoagulants: Secondary | ICD-10-CM | POA: Insufficient documentation

## 2023-01-27 DIAGNOSIS — I10 Essential (primary) hypertension: Secondary | ICD-10-CM | POA: Diagnosis not present

## 2023-01-27 DIAGNOSIS — K7689 Other specified diseases of liver: Secondary | ICD-10-CM | POA: Diagnosis not present

## 2023-01-27 DIAGNOSIS — C250 Malignant neoplasm of head of pancreas: Secondary | ICD-10-CM | POA: Diagnosis not present

## 2023-01-27 DIAGNOSIS — R160 Hepatomegaly, not elsewhere classified: Secondary | ICD-10-CM | POA: Insufficient documentation

## 2023-01-27 DIAGNOSIS — D649 Anemia, unspecified: Secondary | ICD-10-CM | POA: Diagnosis not present

## 2023-01-27 DIAGNOSIS — K769 Liver disease, unspecified: Secondary | ICD-10-CM | POA: Insufficient documentation

## 2023-01-27 DIAGNOSIS — K739 Chronic hepatitis, unspecified: Secondary | ICD-10-CM | POA: Diagnosis not present

## 2023-01-27 DIAGNOSIS — E785 Hyperlipidemia, unspecified: Secondary | ICD-10-CM | POA: Insufficient documentation

## 2023-01-27 DIAGNOSIS — E119 Type 2 diabetes mellitus without complications: Secondary | ICD-10-CM | POA: Diagnosis not present

## 2023-01-27 LAB — CBC
HCT: 32.2 % — ABNORMAL LOW (ref 36.0–46.0)
Hemoglobin: 9.7 g/dL — ABNORMAL LOW (ref 12.0–15.0)
MCH: 28 pg (ref 26.0–34.0)
MCHC: 30.1 g/dL (ref 30.0–36.0)
MCV: 93.1 fL (ref 80.0–100.0)
Platelets: 482 10*3/uL — ABNORMAL HIGH (ref 150–400)
RBC: 3.46 MIL/uL — ABNORMAL LOW (ref 3.87–5.11)
RDW: 15.6 % — ABNORMAL HIGH (ref 11.5–15.5)
WBC: 5.9 10*3/uL (ref 4.0–10.5)
nRBC: 0 % (ref 0.0–0.2)

## 2023-01-27 LAB — GLUCOSE, CAPILLARY: Glucose-Capillary: 122 mg/dL — ABNORMAL HIGH (ref 70–99)

## 2023-01-27 LAB — PROTIME-INR
INR: 1 (ref 0.8–1.2)
Prothrombin Time: 13.6 seconds (ref 11.4–15.2)

## 2023-01-27 MED ORDER — LIDOCAINE HCL (PF) 1 % IJ SOLN
10.0000 mL | Freq: Once | INTRAMUSCULAR | Status: AC
  Administered 2023-01-27: 10 mL
  Filled 2023-01-27: qty 10

## 2023-01-27 MED ORDER — MIDAZOLAM HCL 2 MG/2ML IJ SOLN
INTRAMUSCULAR | Status: AC
  Filled 2023-01-27: qty 4

## 2023-01-27 MED ORDER — SODIUM CHLORIDE 0.9 % IV SOLN: INTRAVENOUS | Status: DC

## 2023-01-27 MED ORDER — MIDAZOLAM HCL 2 MG/2ML IJ SOLN
INTRAMUSCULAR | Status: AC | PRN
  Administered 2023-01-27: .5 mg via INTRAVENOUS
  Administered 2023-01-27: 1 mg via INTRAVENOUS
  Administered 2023-01-27: .5 mg via INTRAVENOUS

## 2023-01-27 MED ORDER — OXYCODONE-ACETAMINOPHEN 5-325 MG PO TABS
1.0000 | ORAL_TABLET | Freq: Once | ORAL | Status: DC
  Filled 2023-01-27: qty 1

## 2023-01-27 MED ORDER — ONDANSETRON HCL 4 MG/2ML IJ SOLN: 4.0000 mg | Freq: Once | INTRAMUSCULAR | Status: DC

## 2023-01-27 MED ORDER — FENTANYL CITRATE (PF) 100 MCG/2ML IJ SOLN
INTRAMUSCULAR | Status: AC
  Filled 2023-01-27: qty 4

## 2023-01-27 MED ORDER — SODIUM CHLORIDE 0.9 % IV SOLN
INTRAVENOUS | Status: AC | PRN
  Administered 2023-01-27: 10 mL/h via INTRAVENOUS

## 2023-01-27 MED ORDER — FENTANYL CITRATE (PF) 100 MCG/2ML IJ SOLN: 50.0000 ug | Freq: Once | INTRAMUSCULAR | Status: AC

## 2023-01-27 MED ORDER — FENTANYL CITRATE (PF) 100 MCG/2ML IJ SOLN
INTRAMUSCULAR | Status: AC | PRN
  Administered 2023-01-27: 25 ug via INTRAVENOUS
  Administered 2023-01-27: 50 ug via INTRAVENOUS
  Administered 2023-01-27: 25 ug via INTRAVENOUS

## 2023-01-27 MED ORDER — ONDANSETRON HCL 4 MG/2ML IJ SOLN
INTRAMUSCULAR | Status: AC
  Filled 2023-01-27: qty 2

## 2023-01-27 MED ORDER — FENTANYL CITRATE (PF) 100 MCG/2ML IJ SOLN
INTRAMUSCULAR | Status: AC
  Administered 2023-01-27: 50 ug via INTRAVENOUS
  Filled 2023-01-27: qty 2

## 2023-01-27 MED ORDER — ONDANSETRON HCL 4 MG/2ML IJ SOLN
INTRAMUSCULAR | Status: AC
  Administered 2023-01-27: 4 mg
  Filled 2023-01-27: qty 2

## 2023-01-27 NOTE — Progress Notes (Signed)
States pain decreased to 5/10 and no nausea

## 2023-01-27 NOTE — Progress Notes (Signed)
Per Victorino Dike, NP client to resume eliquis tomorrow and client and her sister voiced understanding

## 2023-01-27 NOTE — Sedation Documentation (Signed)
Gel Foam administered into biopsy site

## 2023-01-27 NOTE — Procedures (Signed)
Interventional Radiology Procedure Note  Procedure: CT Guided Biopsy of liver lesion  Complications: None  Estimated Blood Loss: < 10 mL  Findings: 18 G core biopsy of liver lesion performed under CT guidance.  Three core samples obtained and sent to Pathology. Aspirate of bloody fluid from trocar needle also sent for culture analysis.  Alyssa Ross. Fredia Sorrow, M.D Pager:  [phone]

## 2023-01-27 NOTE — Progress Notes (Signed)
States right shoulder pain is gone and right sided back pain is down to 5/10 and no nausea; Alyssa Ross notified and ok to d/c home; no new orders

## 2023-01-27 NOTE — Progress Notes (Signed)
C/O nausea and 7/10 right upper quadrant abd pain and pain into right shoulder and back; Victorino Dike, NP notified and orders noted

## 2023-01-29 LAB — SURGICAL PATHOLOGY

## 2023-01-30 ENCOUNTER — Encounter: Payer: Self-pay | Admitting: *Deleted

## 2023-01-30 ENCOUNTER — Inpatient Hospital Stay: Payer: BC Managed Care – PPO

## 2023-01-30 ENCOUNTER — Inpatient Hospital Stay: Payer: BC Managed Care – PPO | Attending: Genetic Counselor

## 2023-01-30 ENCOUNTER — Inpatient Hospital Stay (HOSPITAL_BASED_OUTPATIENT_CLINIC_OR_DEPARTMENT_OTHER): Payer: BC Managed Care – PPO | Admitting: Oncology

## 2023-01-30 ENCOUNTER — Other Ambulatory Visit: Payer: Self-pay | Admitting: *Deleted

## 2023-01-30 VITALS — BP 122/79 | HR 79 | Temp 98.1°F | Resp 18 | Ht 68.0 in | Wt 199.4 lb

## 2023-01-30 DIAGNOSIS — C250 Malignant neoplasm of head of pancreas: Secondary | ICD-10-CM

## 2023-01-30 DIAGNOSIS — Z5111 Encounter for antineoplastic chemotherapy: Secondary | ICD-10-CM | POA: Insufficient documentation

## 2023-01-30 DIAGNOSIS — C7801 Secondary malignant neoplasm of right lung: Secondary | ICD-10-CM | POA: Diagnosis not present

## 2023-01-30 DIAGNOSIS — I10 Essential (primary) hypertension: Secondary | ICD-10-CM | POA: Diagnosis not present

## 2023-01-30 DIAGNOSIS — N6321 Unspecified lump in the left breast, upper outer quadrant: Secondary | ICD-10-CM | POA: Insufficient documentation

## 2023-01-30 DIAGNOSIS — Z79899 Other long term (current) drug therapy: Secondary | ICD-10-CM | POA: Diagnosis not present

## 2023-01-30 DIAGNOSIS — C7802 Secondary malignant neoplasm of left lung: Secondary | ICD-10-CM | POA: Diagnosis not present

## 2023-01-30 DIAGNOSIS — E1140 Type 2 diabetes mellitus with diabetic neuropathy, unspecified: Secondary | ICD-10-CM | POA: Diagnosis not present

## 2023-01-30 LAB — CBC WITH DIFFERENTIAL (CANCER CENTER ONLY)
Abs Immature Granulocytes: 0.06 10*3/uL (ref 0.00–0.07)
Basophils Absolute: 0.1 10*3/uL (ref 0.0–0.1)
Basophils Relative: 1 %
Eosinophils Absolute: 0.1 10*3/uL (ref 0.0–0.5)
Eosinophils Relative: 2 %
HCT: 29.5 % — ABNORMAL LOW (ref 36.0–46.0)
Hemoglobin: 9.2 g/dL — ABNORMAL LOW (ref 12.0–15.0)
Immature Granulocytes: 1 %
Lymphocytes Relative: 18 %
Lymphs Abs: 1 10*3/uL (ref 0.7–4.0)
MCH: 28.7 pg (ref 26.0–34.0)
MCHC: 31.2 g/dL (ref 30.0–36.0)
MCV: 91.9 fL (ref 80.0–100.0)
Monocytes Absolute: 0.6 10*3/uL (ref 0.1–1.0)
Monocytes Relative: 11 %
Neutro Abs: 3.7 10*3/uL (ref 1.7–7.7)
Neutrophils Relative %: 67 %
Platelet Count: 323 10*3/uL (ref 150–400)
RBC: 3.21 MIL/uL — ABNORMAL LOW (ref 3.87–5.11)
RDW: 15 % (ref 11.5–15.5)
WBC Count: 5.4 10*3/uL (ref 4.0–10.5)
nRBC: 0 % (ref 0.0–0.2)

## 2023-01-30 LAB — CMP (CANCER CENTER ONLY)
ALT: 21 U/L (ref 0–44)
AST: 20 U/L (ref 15–41)
Albumin: 3.4 g/dL — ABNORMAL LOW (ref 3.5–5.0)
Alkaline Phosphatase: 141 U/L — ABNORMAL HIGH (ref 38–126)
Anion gap: 6 (ref 5–15)
BUN: 7 mg/dL (ref 6–20)
CO2: 24 mmol/L (ref 22–32)
Calcium: 8.1 mg/dL — ABNORMAL LOW (ref 8.9–10.3)
Chloride: 107 mmol/L (ref 98–111)
Creatinine: 1.01 mg/dL — ABNORMAL HIGH (ref 0.44–1.00)
GFR, Estimated: >60 mL/min (ref >60)
Glucose, Bld: 174 mg/dL — ABNORMAL HIGH (ref 70–99)
Potassium: 3.4 mmol/L — ABNORMAL LOW (ref 3.5–5.1)
Sodium: 137 mmol/L (ref 135–145)
Total Bilirubin: 0.5 mg/dL (ref 0.3–1.2)
Total Protein: 6.9 g/dL (ref 6.5–8.1)

## 2023-01-30 LAB — MAGNESIUM: Magnesium: 1.5 mg/dL — ABNORMAL LOW (ref 1.7–2.4)

## 2023-01-30 MED ORDER — SODIUM CHLORIDE 0.9 % IV SOLN
2000.0000 mg | Freq: Once | INTRAVENOUS | Status: AC
  Administered 2023-01-30: 2000 mg via INTRAVENOUS
  Filled 2023-01-30: qty 52.6

## 2023-01-30 MED ORDER — PROCHLORPERAZINE MALEATE 10 MG PO TABS
10.0000 mg | ORAL_TABLET | Freq: Once | ORAL | Status: AC
  Administered 2023-01-30: 10 mg via ORAL
  Filled 2023-01-30: qty 1

## 2023-01-30 MED ORDER — SODIUM CHLORIDE 0.9% FLUSH: 10.0000 mL | INTRAVENOUS | Status: DC | PRN

## 2023-01-30 MED ORDER — HEPARIN SOD (PORK) LOCK FLUSH 100 UNIT/ML IV SOLN: 500.0000 [IU] | Freq: Once | INTRAVENOUS | Status: DC | PRN

## 2023-01-30 MED ORDER — SODIUM CHLORIDE 0.9 % IV SOLN: Freq: Once | INTRAVENOUS | Status: AC

## 2023-01-30 MED ORDER — PACLITAXEL PROTEIN-BOUND CHEMO INJECTION 100 MG
100.0000 mg/m2 | Freq: Once | INTRAVENOUS | Status: AC
  Administered 2023-01-30: 200 mg via INTRAVENOUS
  Filled 2023-01-30: qty 40

## 2023-01-30 NOTE — Patient Instructions (Signed)
[facility]   Discharge Instructions: Thank you for choosing [facility] to provide your oncology and hematology care.   If you have a lab appointment with the Cancer Center, please go directly to the Cancer Center and check in at the registration area.   Wear comfortable clothing and clothing appropriate for easy access to any Portacath or PICC line.   We strive to give you quality time with your provider. You may need to reschedule your appointment if you arrive late (15 or more minutes).  Arriving late affects you and other patients whose appointments are after yours.  Also, if you miss three or more appointments without notifying the office, you may be dismissed from the clinic at the provider's discretion.      For prescription refill requests, have your pharmacy contact our office and allow 72 hours for refills to be completed.    Today you received the following chemotherapy and/or immunotherapy agents Paclitaxel-protein bound (ABRAXANE) & Gemcitabine (GEMZAR).   To help prevent nausea and vomiting after your treatment, we encourage you to take your nausea medication as directed.  BELOW ARE SYMPTOMS THAT SHOULD BE REPORTED IMMEDIATELY: *FEVER GREATER THAN 100.4 F (38 C) OR HIGHER *CHILLS OR SWEATING *NAUSEA AND VOMITING THAT IS NOT CONTROLLED WITH YOUR NAUSEA MEDICATION *UNUSUAL SHORTNESS OF BREATH *UNUSUAL BRUISING OR BLEEDING *URINARY PROBLEMS (pain or burning when urinating, or frequent urination) *BOWEL PROBLEMS (unusual diarrhea, constipation, pain near the anus) TENDERNESS IN MOUTH AND THROAT WITH OR WITHOUT PRESENCE OF ULCERS (sore throat, sores in mouth, or a toothache) UNUSUAL RASH, SWELLING OR PAIN  UNUSUAL VAGINAL DISCHARGE OR ITCHING   Items with * indicate a potential emergency and should be followed up as soon as possible or go to the Emergency Department if any problems should occur.  Please show the CHEMOTHERAPY  ALERT CARD or IMMUNOTHERAPY ALERT CARD at check-in to the Emergency Department and triage nurse.  Should you have questions after your visit or need to cancel or reschedule your appointment, please contact [facility]  Dept: [phone]  and follow the prompts.  Office hours are 8:00 a.m. to 4:30 p.m. Monday - Friday. Please note that voicemails left after 4:00 p.m. may not be returned until the following business day.  We are closed weekends and major holidays. You have access to a nurse at all times for urgent questions. Please call the main number to the clinic Dept: [phone] and follow the prompts.   For any non-urgent questions, you may also contact your provider using MyChart. We now offer e-Visits for anyone 18 and older to request care online for non-urgent symptoms. For details visit [email or web address].   Also download the MyChart app! Go to the app store, search "MyChart", open the app, select [facility], and log in with your MyChart username and password.  Paclitaxel Nanoparticle Albumin-Bound Injection What is this medication? NANOPARTICLE ALBUMIN-BOUND PACLITAXEL (Na no PAHR ti kuhl al BYOO muhn-bound PAK li TAX el) treats some types of cancer. It works by slowing down the growth of cancer cells. This medicine may be used for other purposes; ask your health care provider or pharmacist if you have questions. COMMON BRAND NAME(S): Abraxane What should I tell my care team before I take this medication? They need to know if you have any of these conditions: Liver disease Low white blood cell levels An unusual or allergic reaction to paclitaxel, albumin, other medications, foods, dyes, or preservatives   If you or your partner are pregnant or trying to get pregnant Breast-feeding How should I use this medication? This medication is injected into a vein. It is given by your care team in a hospital or clinic setting. Talk to your care team  about the use of this medication in children. Special care may be needed. Overdosage: If you think you have taken too much of this medicine contact a poison control center or emergency room at once. NOTE: This medicine is only for you. Do not share this medicine with others. What if I miss a dose? Keep appointments for follow-up doses. It is important not to miss your dose. Call your care team if you are unable to keep an appointment. What may interact with this medication? Other medications may affect the way this medication works. Talk with your care team about all of the medications you take. They may suggest changes to your treatment plan to lower the risk of side effects and to make sure your medications work as intended. This list may not describe all possible interactions. Give your health care provider a list of all the medicines, herbs, non-prescription drugs, or dietary supplements you use. Also tell them if you smoke, drink alcohol, or use illegal drugs. Some items may interact with your medicine. What should I watch for while using this medication? Your condition will be monitored carefully while you are receiving this medication. You may need blood work while taking this medication. This medication may make you feel generally unwell. This is not uncommon as chemotherapy can affect healthy cells as well as cancer cells. Report any side effects. Continue your course of treatment even though you feel ill unless your care team tells you to stop. This medication can cause serious allergic reactions. To reduce the risk, your care team may give you other medications to take before receiving this one. Be sure to follow the directions from your care team. This medication may increase your risk of getting an infection. Call your care team for advice if you get a fever, chills, sore throat, or other symptoms of a cold or flu. Do not treat yourself. Try to avoid being around people who are sick. This  medication may increase your risk to bruise or bleed. Call your care team if you notice any unusual bleeding. Be careful brushing or flossing your teeth or using a toothpick because you may get an infection or bleed more easily. If you have any dental work done, tell your dentist you are receiving this medication. Talk to your care team if you or your partner may be pregnant. Serious birth defects can occur if you take this medication during pregnancy and for 6 months after the last dose. You will need a negative pregnancy test before starting this medication. Contraception is recommended while taking this medication and for 6 months after the last dose. Your care team can help you find the option that works for you. If your partner can get pregnant, use a condom during sex while taking this medication and for 3 months after the last dose. Do not breastfeed while taking this medication and for 2 weeks after the last dose. This medication may cause infertility. Talk to your care team if you are concerned about your fertility. What side effects may I notice from receiving this medication? Side effects that you should report to your care team as soon as possible: Allergic reactions--skin rash, itching, hives, swelling of the face, lips, tongue, or throat Dry cough,   shortness of breath or trouble breathing Infection--fever, chills, cough, sore throat, wounds that don't heal, pain or trouble when passing urine, general feeling of discomfort or being unwell Low red blood cell level--unusual weakness or fatigue, dizziness, headache, trouble breathing Pain, tingling, or numbness in the hands or feet Stomach pain, unusual weakness or fatigue, nausea, vomiting, diarrhea, or fever that lasts longer than expected Unusual bruising or bleeding Side effects that usually do not require medical attention (report to your care team if they continue or are bothersome): Diarrhea Fatigue Hair loss Loss of  appetite Nausea Vomiting This list may not describe all possible side effects. Call your doctor for medical advice about side effects. You may report side effects to FDA at 1-800-FDA-1088. Where should I keep my medication? This medication is given in a hospital or clinic. It will not be stored at home. NOTE: This sheet is a summary. It may not cover all possible information. If you have questions about this medicine, talk to your doctor, pharmacist, or health care provider.  2024 Elsevier/Gold Standard (2021-10-11 00:00:00)  Gemcitabine Injection What is this medication? GEMCITABINE (jem SYE ta been) treats some types of cancer. It works by slowing down the growth of cancer cells. This medicine may be used for other purposes; ask your health care provider or pharmacist if you have questions. COMMON BRAND NAME(S): Gemzar, Infugem What should I tell my care team before I take this medication? They need to know if you have any of these conditions: Blood disorders Infection Kidney disease Liver disease Lung or breathing disease, such as asthma or COPD Recent or ongoing radiation therapy An unusual or allergic reaction to gemcitabine, other medications, foods, dyes, or preservatives If you or your partner are pregnant or trying to get pregnant Breast-feeding How should I use this medication? This medication is injected into a vein. It is given by your care team in a hospital or clinic setting. Talk to your care team about the use of this medication in children. Special care may be needed. Overdosage: If you think you have taken too much of this medicine contact a poison control center or emergency room at once. NOTE: This medicine is only for you. Do not share this medicine with others. What if I miss a dose? Keep appointments for follow-up doses. It is important not to miss your dose. Call your care team if you are unable to keep an appointment. What may interact with this  medication? Interactions have not been studied. This list may not describe all possible interactions. Give your health care provider a list of all the medicines, herbs, non-prescription drugs, or dietary supplements you use. Also tell them if you smoke, drink alcohol, or use illegal drugs. Some items may interact with your medicine. What should I watch for while using this medication? Your condition will be monitored carefully while you are receiving this medication. This medication may make you feel generally unwell. This is not uncommon, as chemotherapy can affect healthy cells as well as cancer cells. Report any side effects. Continue your course of treatment even though you feel ill unless your care team tells you to stop. In some cases, you may be given additional medications to help with side effects. Follow all directions for their use. This medication may increase your risk of getting an infection. Call your care team for advice if you get a fever, chills, sore throat, or other symptoms of a cold or flu. Do not treat yourself. Try to avoid being around   people who are sick. This medication may increase your risk to bruise or bleed. Call your care team if you notice any unusual bleeding. Be careful brushing or flossing your teeth or using a toothpick because you may get an infection or bleed more easily. If you have any dental work done, tell your dentist you are receiving this medication. Avoid taking medications that contain aspirin, acetaminophen, ibuprofen, naproxen, or ketoprofen unless instructed by your care team. These medications may hide a fever. Talk to your care team if you or your partner wish to become pregnant or think you might be pregnant. This medication can cause serious birth defects if taken during pregnancy and for 6 months after the last dose. A negative pregnancy test is required before starting this medication. A reliable form of contraception is recommended while taking  this medication and for 6 months after the last dose. Talk to your care team about effective forms of contraception. Do not father a child while taking this medication and for 3 months after the last dose. Use a condom while having sex during this time period. Do not breastfeed while taking this medication and for at least 1 week after the last dose. This medication may cause infertility. Talk to your care team if you are concerned about your fertility. What side effects may I notice from receiving this medication? Side effects that you should report to your care team as soon as possible: Allergic reactions--skin rash, itching, hives, swelling of the face, lips, tongue, or throat Capillary leak syndrome--stomach or muscle pain, unusual weakness or fatigue, feeling faint or lightheaded, decrease in the amount of urine, swelling of the ankles, hands, or feet, trouble breathing Infection--fever, chills, cough, sore throat, wounds that don't heal, pain or trouble when passing urine, general feeling of discomfort or being unwell Liver injury--right upper belly pain, loss of appetite, nausea, light-colored stool, dark yellow or brown urine, yellowing skin or eyes, unusual weakness or fatigue Low red blood cell level--unusual weakness or fatigue, dizziness, headache, trouble breathing Lung injury--shortness of breath or trouble breathing, cough, spitting up blood, chest pain, fever Stomach pain, bloody diarrhea, pale skin, unusual weakness or fatigue, decrease in the amount of urine, which may be signs of hemolytic uremic syndrome Sudden and severe headache, confusion, change in vision, seizures, which may be signs of posterior reversible encephalopathy syndrome (PRES) Unusual bruising or bleeding Side effects that usually do not require medical attention (report to your care team if they continue or are bothersome): Diarrhea Drowsiness Hair loss Nausea Pain, redness, or swelling with sores inside the  mouth or throat Vomiting This list may not describe all possible side effects. Call your doctor for medical advice about side effects. You may report side effects to FDA at 1-800-FDA-1088. Where should I keep my medication? This medication is given in a hospital or clinic. It will not be stored at home. NOTE: This sheet is a summary. It may not cover all possible information. If you have questions about this medicine, talk to your doctor, pharmacist, or health care provider.  2024 Elsevier/Gold Standard (2021-10-02 00:00:00)  

## 2023-01-30 NOTE — Patient Instructions (Signed)

## 2023-01-30 NOTE — Progress Notes (Signed)
[facility] OFFICE PROGRESS NOTE   Diagnosis: Pancreas cancer  INTERVAL HISTORY:   Ms. Vigliotti returns as scheduled.  She underwent a CT-guided biopsy of a liver lesion on 01/27/2023.  A biopsy and aspiration were performed.  She reports mild increase in neuropathy symptoms at the fingers.  Objective:  Vital signs in last 24 hours:  Blood pressure 122/79, pulse 79, temperature 98.1 F (36.7 C), temperature source Oral, resp. rate 18, height 5\' 8"  (1.727 m), weight 199 lb 6.4 oz (90.4 kg), last menstrual period 09/17/2012, SpO2 100%.    HEENT: No thrush or ulcers Resp: Lungs clear bilaterally Cardio: Regular rate and rhythm GI: No hepatosplenomegaly, no mass, nontender Vascular: No leg edema   Portacath/PICC-without erythema  Lab Results:  Lab Results  Component Value Date   WBC 5.4 01/30/2023   HGB 9.2 (L) 01/30/2023   HCT 29.5 (L) 01/30/2023   MCV 91.9 01/30/2023   PLT 323 01/30/2023   NEUTROABS 3.7 01/30/2023    CMP  Lab Results  Component Value Date   NA 137 01/30/2023   K 3.4 (L) 01/30/2023   CL 107 01/30/2023   CO2 24 01/30/2023   GLUCOSE 174 (H) 01/30/2023   BUN 7 01/30/2023   CREATININE 1.01 (H) 01/30/2023   CALCIUM 8.1 (L) 01/30/2023   PROT 6.9 01/30/2023   ALBUMIN 3.4 (L) 01/30/2023   AST 20 01/30/2023   ALT 21 01/30/2023   ALKPHOS 141 (H) 01/30/2023   BILITOT 0.5 01/30/2023   GFRNONAA >60 01/30/2023   GFRAA >60 02/07/2020    Lab Results  Component Value Date   WUJ811 176 (H) 01/02/2023    Lab Results  Component Value Date   INR 1.0 01/27/2023   LABPROT 13.6 01/27/2023    Imaging:  CT LIVER MASS BIOPSY  Result Date: 01/27/2023 CLINICAL DATA:  History of pancreatic carcinoma with new low-density lesion in the liver. EXAM: CT GUIDED CORE BIOPSY OF LIVER LESION ANESTHESIA/SEDATION: Moderate (conscious) sedation was employed during this procedure. A total of Versed 2.0 mg and Fentanyl 100 mcg was administered intravenously.  Moderate Sedation Time: 30 minutes. The patient's level of consciousness and vital signs were monitored continuously by radiology nursing throughout the procedure under my direct supervision. PROCEDURE: The procedure risks, benefits, and alternatives were explained to the patient. Questions regarding the procedure were encouraged and answered. The patient understands and consents to the procedure. A time-out was performed prior to initiating the procedure. CT was performed in supine and slightly oblique positions. Bedside ultrasound was also performed. The operative field was prepped with chlorhexidine in a sterile fashion, and a sterile drape was applied covering the operative field. A sterile gown and sterile gloves were used for the procedure. Local anesthesia was provided with 1% Lidocaine. Under CT guidance, a 17 gauge trocar needle was advanced into the liver. After confirming needle tip position at the level of the lesion, 3 separate coaxial 18 gauge core biopsy samples were obtained and submitted in formalin. The 17 gauge outer needle was also advanced slightly and aspiration performed. A small fluid sample was sent for culture analysis. Gel-Foam pledgets were advanced through the 17 gauge needle as the needle was retracted and removed. Additional CT was performed. RADIATION DOSE REDUCTION: This exam was performed according to the departmental dose-optimization program which includes automated exposure control, adjustment of the mA and/or kV according to patient size and/or use of iterative reconstruction technique. COMPLICATIONS: None FINDINGS: Unenhanced CT again demonstrates a low-density lesion in the liver  roughly at the juncture of left and right lobes measuring approximately 1.1 x 2.3 cm by unenhanced CT. This was occult by bedside ultrasound evaluation. The lesion did not behave like a fluid collection and yielded solid tissue and tissue fragments by biopsy. Aspiration was performed after core biopsy  yielding a few mL of bloody fluid which may have been related to biopsy or an adjacent vessel. This fluid was sent for culture analysis. IMPRESSION: CT-guided core biopsy of a low-density lesion in the liver roughly at the juncture of left and right lobes. This lesion did not yield fluid by core biopsy and yielded solid tissue and tissue fragments. Tissue was sent for pathologic analysis. Aspiration was performed after core biopsy yielding a few mL of bloody fluid which may have been related to biopsy or an adjacent vessel. This fluid was sent for culture analysis. Electronically Signed   By: Irish Lack M.D.   On: 01/27/2023 15:11    Medications: I have reviewed the patient's current medications.   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 Tumor invades the duodenal wall and vascular groove, resection margins negative, 4/34 lymph nodes positive MSI-stable, tumor showed instability in 2 loci as did adjacent normal tissue Foundation 1-K-ras G12 V, microsatellite status and tumor mutation burden cannot be determined EUS FNA biopsy of pancreas mass on 07/03/2018-well-differentiated neuroendocrine tumor CTs 01/29/2019-ill-defined pancreas head mass, 5 pulmonary nodules-1 with a small amount of central cavitation, tumor abuts the left margin of the portal vein indistinct density surrounding, hepatic artery, complex cystic lesion of the right kidney, right adrenal mass-characterized as an adenoma on a [facility] MRI 12/21/2018 Netspot 03/03/2019-no focal pancreas activity, no tracer accumulation within the suspicious pulmonary nodules, left uterine mass with tracer accumulation felt to represent a leiomyoma Elevated preoperative CA 19-9--CA 19-9 186 on 01/18/2019 CT chest 04/16/2019-multiple bilateral pulmonary nodules, some with increased cavitation, stable in size Cycle 1 FOLFIRINOX 04/27/2019 Cycle 2 FOLFIRINOX 05/11/2019 Cycle 3 FOLFIRINOX  05/23/2019 Cycle 4 FOLFIRINOX 06/08/2019 Cycle 5 FOLFIRINOX 06/22/2019 CT chest 07/02/2019-stable size of bilateral pulmonary nodules.  Dominant cavitary lesions in both lungs show increased cavitation with thinner walls.  Stable 2.1 cm right adrenal nodule. 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 No definite evidence of metastatic disease in the abdomen or pelvis. Markedly enlarged and heterogeneous appearing uterus, likely to represent multifocal fibroids. 1 of these lesions appears to be an exophytic subserosal fibroid in the posterior lateral aspect of the uterine body on the left side although this comes in close proximity to the left adnexa such that a primary ovarian lesion is difficult to completely exclude. CTs 02/07/2020-slight enlargement of bilateral lung nodules, some are cavitary, no evidence of metastatic disease in the abdomen or pelvis, stable right adrenal nodule, uterine fibroids CTs 04/26/2020-mild enlargement of pulmonary nodules, slight increase in trace pelvic fluid, new soft tissue thickening inferior to the cecal tip suspicious for peritoneal metastasis CT 05/26/2020-improved appearance of soft tissue at the inferior tip of the cecum, mildly thickened short appendix-findings suggestive of resolving appendicitis, stable small bibasilar pulmonary nodules, fibroids Plan biopsy of right cecal tip soft tissue canceled secondary to radiologic improvement CT chest 08/02/2020-enlargement  and progressive cavitation of multiple bilateral lung nodules.  Some new nodules are  present. CTs 10/24/2020- increase in size of pulmonary nodules, no new nodules, no evidence of metastatic disease in the abdomen, stable right adrenal nodule CT 01/09/2021-slight interval enlargement of pulmonary nodules, stable right adrenal nodule Navigation bronchoscopy 01/30/2021-left lower lobe cavitary nodule FNA-adenocarcinoma, brushing-adenocarcinoma.  Left lower lobe lavage-adenocarcinoma.  Right upper lobe brushing and FNA biopsy of cavitary nodule-adenocarcinoma-immunohistochemical profile consistent with pancreas adenocarcinoma Cycle 1 gemcitabine/Abraxane 03/28/2021 Cycle 2 gemcitabine/Abraxane 04/11/2021 Cycle 3 gemcitabine/Abraxane 04/25/2021 Cycle 4 gemcitabine/Abraxane 05/09/2021 Cycle 5 gemcitabine/Abraxane 05/23/2021 CT chest 06/05/2021-interval cavitation of multiple pulmonary nodules, some nodules have decreased in size, no new or enlarging nodules Cycle 6 gemcitabine/Abraxane 06/06/2021 Cycle 7 gemcitabine/Abraxane 06/21/2021 Cycle 8 gemcitabine/Abraxane 07/05/2021 Cycle 9 Gemcitabine/Abraxane 07/19/2021 Cycle 10 gemcitabine 08/01/2021-Abraxane held secondary to neuropathy CT chest 08/13/2021-mild decrease in size and wall thickness of multiple cavitary nodules, no new or progressive disease in the chest, indeterminate low-attenuation right liver lesions Cycle 11 gemcitabine 08/16/2021-Abraxane held secondary to neuropathy Cycle 12 gemcitabine 08/30/2021-Abraxane held secondary to neuropathy Cycle 13 gemcitabine 09/13/2021-Abraxane held secondary to neuropathy Cycle 14 gemcitabine 09/27/2021-Abraxane held secondary to neuropathy Cycle 15 gemcitabine 10/11/2021-Abraxane held secondary to neuropathy CTs 10/22/2021-no change in multiple cavitary lung nodules, no evidence of disease progression, ill-defined hypodense lesion in the posterior right liver suspicious for metastatic disease Cycle 16 gemcitabine 10/25/2021 Cycle 17 gemcitabine 11/08/2021 Cycle 18 Gemcitabine 11/22/2021 Cycle 19  gemcitabine 12/06/2021 Cycle 20 Gemcitabine 12/20/2021 CT 12/31/2021-mild increase in size of bilateral pulmonary metastases, stable subtle continuation right liver lesions Cycle 20 gemcitabine/Abraxane 01/03/2022 Cycle 21 gemcitabine/Abraxane 01/17/2022 Cycle 22 gemcitabine/Abraxane 01/31/2022 Cycle 23 gemcitabine/Abraxane 02/14/2022 Cycle 24 gemcitabine/Abraxane 02/28/2022 CTs 03/11/2022-widespread metastatic disease to the lungs again noted with slight involution of several of the pulmonary nodules, no definite new nodules noted; interval cavitation of solid lesion in the posterior aspect right lobe of the liver, no new liver lesions noted. Cycle 25 Gemcitabine/Abraxane 03/14/2022 Cycle 26 gemcitabine/Abraxane 03/28/2022 Cycle 27 gemcitabine/Abraxane 04/25/2022 Cycle 28 gemcitabine/Abraxane 05/09/2022 Cycle 29 Gemcitabine 05/23/2022, Abraxane held due to neuropathy CTs 06/04/2022-stable multifocal cavitary pulmonary nodules.  No definite new nodules.  No new nodules greater than a centimeter.  Decreased size of the right posterior hepatic lobe lesion.  Generalized heterogeneity and new focal areas of hypodensity in the liver. Cycle 30 Gemcitabine 06/06/2022, Abraxane held due to neuropathy Cycle 31 gemcitabine 06/20/2022, Abraxane held due to neuropathy Cycle 32 gemcitabine 07/04/2022, Abraxane held due to neuropathy Cycle 33 gemcitabine  07/18/2022 ,Abraxane held due to neuropathy Cycle 34 gemcitabine 08/15/2022, Abraxane held due to neuropathy Cycle 35 gemcitabine 08/29/2022, Abraxane held due to neuropathy CTs 09/10/2022-enlarging pulmonary metastases.  Subtle poorly defined hypoattenuating lesions in the liver, less well-seen than on 06/04/2022.  Suspected new lesion in the dome of the right hepatic lobe Cycle 36 gemcitabine/Abraxane 09/12/2022 Cycle 37 gemcitabine/Abraxane 09/26/2022 Cycle 38 gemcitabine/Abraxane 10/10/2022 Cycle 39 gemcitabine/Abraxane 11/07/2022 Cycle 40 gemcitabine/Abraxane  11/21/2022 Cycle 41 gemcitabine/Abraxane 12/05/2022 Cycle 42 gemcitabine/Abraxane 12/19/2022 CT is 12/30/2022-new left hepatic lesion, stable bilateral pulmonary metastases Cycle 43 gemcitabine/Abraxane 01/02/2023 CT-guided biopsy of liver lesion 01/27/2023-acute/chronic inflammation, negative for malignancy, culture negative Cycle 44 gemcitabine/Abraxane 01/30/2023   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  Apixaban 10/24/2022        Disposition: Ms. Dimanche appears stable.  The lesion biopsy returned negative for malignancy.  The culture is negative to date.  The plan is to observe the lesion.  She will be referred for a repeat CT in approximately 2 months.  Ms. Proffer will continue every 2-week gemcitabine/Abraxane.  She will complete another cycle today.  Thornton Papas, MD  01/30/2023  10:46 AM

## 2023-01-30 NOTE — Progress Notes (Signed)
Patient seen by Dr. Truett Perna today  Vitals are within treatment parameters.  Labs reviewed by Dr. Truett Perna and are not all within treatment parameters. K+ 3.4 and Mg+ 1.5--OK to proceed. MD is not giving IV electrolyte replacement per patient request.  Per physician team, patient is ready for treatment and there are NO modifications to the treatment plan.

## 2023-01-31 LAB — CANCER ANTIGEN 19-9: CA 19-9: 162 U/mL — ABNORMAL HIGH (ref 0–35)

## 2023-02-02 LAB — AEROBIC/ANAEROBIC CULTURE W GRAM STAIN (SURGICAL/DEEP WOUND): Culture: NO GROWTH

## 2023-02-13 ENCOUNTER — Encounter: Payer: Self-pay | Admitting: Nurse Practitioner

## 2023-02-13 ENCOUNTER — Inpatient Hospital Stay (HOSPITAL_BASED_OUTPATIENT_CLINIC_OR_DEPARTMENT_OTHER): Payer: BC Managed Care – PPO | Admitting: Nurse Practitioner

## 2023-02-13 ENCOUNTER — Inpatient Hospital Stay: Payer: BC Managed Care – PPO

## 2023-02-13 ENCOUNTER — Inpatient Hospital Stay: Payer: BC Managed Care – PPO | Attending: Genetic Counselor

## 2023-02-13 VITALS — BP 103/70 | HR 81 | Temp 98.1°F | Resp 18 | Ht 68.0 in | Wt 194.9 lb

## 2023-02-13 VITALS — BP 124/80 | HR 62 | Resp 18

## 2023-02-13 DIAGNOSIS — C7801 Secondary malignant neoplasm of right lung: Secondary | ICD-10-CM | POA: Insufficient documentation

## 2023-02-13 DIAGNOSIS — Z79899 Other long term (current) drug therapy: Secondary | ICD-10-CM | POA: Diagnosis not present

## 2023-02-13 DIAGNOSIS — C3411 Malignant neoplasm of upper lobe, right bronchus or lung: Secondary | ICD-10-CM | POA: Diagnosis not present

## 2023-02-13 DIAGNOSIS — Z905 Acquired absence of kidney: Secondary | ICD-10-CM | POA: Diagnosis not present

## 2023-02-13 DIAGNOSIS — Z85528 Personal history of other malignant neoplasm of kidney: Secondary | ICD-10-CM | POA: Diagnosis not present

## 2023-02-13 DIAGNOSIS — C7802 Secondary malignant neoplasm of left lung: Secondary | ICD-10-CM | POA: Diagnosis not present

## 2023-02-13 DIAGNOSIS — E1140 Type 2 diabetes mellitus with diabetic neuropathy, unspecified: Secondary | ICD-10-CM | POA: Insufficient documentation

## 2023-02-13 DIAGNOSIS — Z5111 Encounter for antineoplastic chemotherapy: Secondary | ICD-10-CM | POA: Insufficient documentation

## 2023-02-13 DIAGNOSIS — C250 Malignant neoplasm of head of pancreas: Secondary | ICD-10-CM | POA: Diagnosis not present

## 2023-02-13 DIAGNOSIS — I10 Essential (primary) hypertension: Secondary | ICD-10-CM | POA: Insufficient documentation

## 2023-02-13 DIAGNOSIS — K769 Liver disease, unspecified: Secondary | ICD-10-CM | POA: Insufficient documentation

## 2023-02-13 LAB — CBC WITH DIFFERENTIAL (CANCER CENTER ONLY)
Abs Immature Granulocytes: 0.02 10*3/uL (ref 0.00–0.07)
Basophils Absolute: 0 10*3/uL (ref 0.0–0.1)
Basophils Relative: 1 %
Eosinophils Absolute: 0.2 10*3/uL (ref 0.0–0.5)
Eosinophils Relative: 4 %
HCT: 28 % — ABNORMAL LOW (ref 36.0–46.0)
Hemoglobin: 8.7 g/dL — ABNORMAL LOW (ref 12.0–15.0)
Immature Granulocytes: 0 %
Lymphocytes Relative: 18 %
Lymphs Abs: 1 10*3/uL (ref 0.7–4.0)
MCH: 27.9 pg (ref 26.0–34.0)
MCHC: 31.1 g/dL (ref 30.0–36.0)
MCV: 89.7 fL (ref 80.0–100.0)
Monocytes Absolute: 0.6 10*3/uL (ref 0.1–1.0)
Monocytes Relative: 11 %
Neutro Abs: 3.7 10*3/uL (ref 1.7–7.7)
Neutrophils Relative %: 66 %
Platelet Count: 559 10*3/uL — ABNORMAL HIGH (ref 150–400)
RBC: 3.12 MIL/uL — ABNORMAL LOW (ref 3.87–5.11)
RDW: 14.5 % (ref 11.5–15.5)
WBC Count: 5.6 10*3/uL (ref 4.0–10.5)
nRBC: 0 % (ref 0.0–0.2)

## 2023-02-13 LAB — CMP (CANCER CENTER ONLY)
ALT: 16 U/L (ref 0–44)
AST: 14 U/L — ABNORMAL LOW (ref 15–41)
Albumin: 3.5 g/dL (ref 3.5–5.0)
Alkaline Phosphatase: 187 U/L — ABNORMAL HIGH (ref 38–126)
Anion gap: 6 (ref 5–15)
BUN: 9 mg/dL (ref 6–20)
CO2: 24 mmol/L (ref 22–32)
Calcium: 8.3 mg/dL — ABNORMAL LOW (ref 8.9–10.3)
Chloride: 104 mmol/L (ref 98–111)
Creatinine: 1.11 mg/dL — ABNORMAL HIGH (ref 0.44–1.00)
GFR, Estimated: 58 mL/min — ABNORMAL LOW (ref >60)
Glucose, Bld: 309 mg/dL — ABNORMAL HIGH (ref 70–99)
Potassium: 4 mmol/L (ref 3.5–5.1)
Sodium: 134 mmol/L — ABNORMAL LOW (ref 135–145)
Total Bilirubin: 0.4 mg/dL (ref 0.3–1.2)
Total Protein: 7.6 g/dL (ref 6.5–8.1)

## 2023-02-13 LAB — MAGNESIUM: Magnesium: 1.7 mg/dL (ref 1.7–2.4)

## 2023-02-13 MED ORDER — HEPARIN SOD (PORK) LOCK FLUSH 100 UNIT/ML IV SOLN
500.0000 [IU] | Freq: Once | INTRAVENOUS | Status: AC | PRN
  Administered 2023-02-13: 500 [IU]

## 2023-02-13 MED ORDER — SODIUM CHLORIDE 0.9 % IV SOLN: Freq: Once | INTRAVENOUS | Status: AC

## 2023-02-13 MED ORDER — SODIUM CHLORIDE 0.9 % IV SOLN
2000.0000 mg | Freq: Once | INTRAVENOUS | Status: AC
  Administered 2023-02-13: 2000 mg via INTRAVENOUS
  Filled 2023-02-13: qty 52.6

## 2023-02-13 MED ORDER — SODIUM CHLORIDE 0.9% FLUSH
10.0000 mL | INTRAVENOUS | Status: DC | PRN
  Administered 2023-02-13: 10 mL

## 2023-02-13 MED ORDER — PROCHLORPERAZINE MALEATE 10 MG PO TABS
10.0000 mg | ORAL_TABLET | Freq: Once | ORAL | Status: AC
  Administered 2023-02-13: 10 mg via ORAL
  Filled 2023-02-13: qty 1

## 2023-02-13 MED ORDER — PACLITAXEL PROTEIN-BOUND CHEMO INJECTION 100 MG
100.0000 mg/m2 | Freq: Once | INTRAVENOUS | Status: AC
  Administered 2023-02-13: 200 mg via INTRAVENOUS
  Filled 2023-02-13: qty 40

## 2023-02-13 NOTE — Progress Notes (Signed)
Patient seen by Lisa [name] NP today  Vitals are within treatment parameters.  Labs reviewed by Lisa [name] NP and are within treatment parameters.  Per physician team, patient is ready for treatment and there are NO modifications to the treatment plan.     

## 2023-02-13 NOTE — Progress Notes (Signed)
[facility] OFFICE PROGRESS NOTE   Diagnosis:  Pancreas cancer  INTERVAL HISTORY:   Alyssa Ross returns as scheduled.  She completed another cycle of gemcitabine/Abraxane 01/30/2023.  She denies nausea/matting.  No mouth sores.  No diarrhea.  Neuropathy symptoms vary slightly but overall stable.  Main complaint is fatigue.  Objective:  Vital signs in last 24 hours:  Blood pressure 103/70, pulse 81, temperature 98.1 F (36.7 C), temperature source Oral, resp. rate 18, height 5\' 8"  (1.727 m), weight 194 lb 14.4 oz (88.4 kg), last menstrual period 09/17/2012, SpO2 100%.    HEENT: No thrush or ulcers. Resp: Lungs clear bilaterally. Cardio: Regular rate and rhythm. GI: No hepatosplenomegaly. Vascular: No leg edema.  Port-A-Cath without erythema.  Lab Results:  Lab Results  Component Value Date   WBC 5.6 02/13/2023   HGB 8.7 (L) 02/13/2023   HCT 28.0 (L) 02/13/2023   MCV 89.7 02/13/2023   PLT 559 (H) 02/13/2023   NEUTROABS 3.7 02/13/2023    Imaging:  No results found.  Medications: I have reviewed the patient's current medications.  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 Tumor invades the duodenal wall and vascular groove, resection margins negative, 4/34 lymph nodes positive MSI-stable, tumor showed instability in 2 loci as did adjacent normal tissue Foundation 1-K-ras G12 V, microsatellite status and tumor mutation burden cannot be determined EUS FNA biopsy of pancreas mass on 07/03/2018-well-differentiated neuroendocrine tumor CTs 01/29/2019-ill-defined pancreas head mass, 5 pulmonary nodules-1 with a small amount of central cavitation, tumor abuts the left margin of the portal vein indistinct density surrounding, hepatic artery, complex cystic lesion of the right kidney, right adrenal mass-characterized as an adenoma on a [facility] MRI 12/21/2018 Netspot 03/03/2019-no focal pancreas activity, no  tracer accumulation within the suspicious pulmonary nodules, left uterine mass with tracer accumulation felt to represent a leiomyoma Elevated preoperative CA 19-9--CA 19-9 186 on 01/18/2019 CT chest 04/16/2019-multiple bilateral pulmonary nodules, some with increased cavitation, stable in size Cycle 1 FOLFIRINOX 04/27/2019 Cycle 2 FOLFIRINOX 05/11/2019 Cycle 3 FOLFIRINOX 05/23/2019 Cycle 4 FOLFIRINOX 06/08/2019 Cycle 5 FOLFIRINOX 06/22/2019 CT chest 07/02/2019-stable size of bilateral pulmonary nodules.  Dominant cavitary lesions in both lungs show increased cavitation with thinner walls.  Stable 2.1 cm right adrenal nodule. 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 No definite evidence of metastatic disease in the abdomen or pelvis. Markedly enlarged and heterogeneous appearing uterus, likely to represent multifocal fibroids. 1 of these lesions appears to be an exophytic subserosal fibroid in the posterior lateral aspect of the uterine body on the left side although this comes in close proximity to the left adnexa such that a primary ovarian lesion is difficult to completely exclude. CTs 02/07/2020-slight enlargement of bilateral lung nodules, some are cavitary, no evidence of metastatic disease in the abdomen or pelvis, stable right adrenal nodule, uterine fibroids CTs 04/26/2020-mild enlargement of pulmonary nodules, slight increase in trace pelvic fluid, new soft tissue thickening inferior to the cecal tip suspicious for peritoneal metastasis CT 05/26/2020-improved appearance of soft  tissue at the inferior tip of the cecum, mildly thickened short appendix-findings suggestive of resolving appendicitis, stable small bibasilar pulmonary nodules, fibroids Plan biopsy  of right cecal tip soft tissue canceled secondary to radiologic improvement CT chest 08/02/2020-enlargement and progressive cavitation of multiple bilateral lung nodules.  Some new nodules are present. CTs 10/24/2020- increase in size of pulmonary nodules, no new nodules, no evidence of metastatic disease in the abdomen, stable right adrenal nodule CT 01/09/2021-slight interval enlargement of pulmonary nodules, stable right adrenal nodule Navigation bronchoscopy 01/30/2021-left lower lobe cavitary nodule FNA-adenocarcinoma, brushing-adenocarcinoma.  Left lower lobe lavage-adenocarcinoma.  Right upper lobe brushing and FNA biopsy of cavitary nodule-adenocarcinoma-immunohistochemical profile consistent with pancreas adenocarcinoma Cycle 1 gemcitabine/Abraxane 03/28/2021 Cycle 2 gemcitabine/Abraxane 04/11/2021 Cycle 3 gemcitabine/Abraxane 04/25/2021 Cycle 4 gemcitabine/Abraxane 05/09/2021 Cycle 5 gemcitabine/Abraxane 05/23/2021 CT chest 06/05/2021-interval cavitation of multiple pulmonary nodules, some nodules have decreased in size, no new or enlarging nodules Cycle 6 gemcitabine/Abraxane 06/06/2021 Cycle 7 gemcitabine/Abraxane 06/21/2021 Cycle 8 gemcitabine/Abraxane 07/05/2021 Cycle 9 Gemcitabine/Abraxane 07/19/2021 Cycle 10 gemcitabine 08/01/2021-Abraxane held secondary to neuropathy CT chest 08/13/2021-mild decrease in size and wall thickness of multiple cavitary nodules, no new or progressive disease in the chest, indeterminate low-attenuation right liver lesions Cycle 11 gemcitabine 08/16/2021-Abraxane held secondary to neuropathy Cycle 12 gemcitabine 08/30/2021-Abraxane held secondary to neuropathy Cycle 13 gemcitabine 09/13/2021-Abraxane held secondary to neuropathy Cycle 14 gemcitabine 09/27/2021-Abraxane held secondary to  neuropathy Cycle 15 gemcitabine 10/11/2021-Abraxane held secondary to neuropathy CTs 10/22/2021-no change in multiple cavitary lung nodules, no evidence of disease progression, ill-defined hypodense lesion in the posterior right liver suspicious for metastatic disease Cycle 16 gemcitabine 10/25/2021 Cycle 17 gemcitabine 11/08/2021 Cycle 18 Gemcitabine 11/22/2021 Cycle 19 gemcitabine 12/06/2021 Cycle 20 Gemcitabine 12/20/2021 CT 12/31/2021-mild increase in size of bilateral pulmonary metastases, stable subtle continuation right liver lesions Cycle 20 gemcitabine/Abraxane 01/03/2022 Cycle 21 gemcitabine/Abraxane 01/17/2022 Cycle 22 gemcitabine/Abraxane 01/31/2022 Cycle 23 gemcitabine/Abraxane 02/14/2022 Cycle 24 gemcitabine/Abraxane 02/28/2022 CTs 03/11/2022-widespread metastatic disease to the lungs again noted with slight involution of several of the pulmonary nodules, no definite new nodules noted; interval cavitation of solid lesion in the posterior aspect right lobe of the liver, no new liver lesions noted. Cycle 25 Gemcitabine/Abraxane 03/14/2022 Cycle 26 gemcitabine/Abraxane 03/28/2022 Cycle 27 gemcitabine/Abraxane 04/25/2022 Cycle 28 gemcitabine/Abraxane 05/09/2022 Cycle 29 Gemcitabine 05/23/2022, Abraxane held due to neuropathy CTs 06/04/2022-stable multifocal cavitary pulmonary nodules.  No definite new nodules.  No new nodules greater than a centimeter.  Decreased size of the right posterior hepatic lobe lesion.  Generalized heterogeneity and new focal areas of hypodensity in the liver. Cycle 30 Gemcitabine 06/06/2022, Abraxane held due to neuropathy Cycle 31 gemcitabine 06/20/2022, Abraxane held due to neuropathy Cycle 32 gemcitabine 07/04/2022, Abraxane held due to neuropathy Cycle 33 gemcitabine  07/18/2022 ,Abraxane held due to neuropathy Cycle 34 gemcitabine 08/15/2022, Abraxane held due to neuropathy Cycle 35 gemcitabine 08/29/2022, Abraxane held due to neuropathy CTs 09/10/2022-enlarging pulmonary  metastases.  Subtle poorly defined hypoattenuating lesions in the liver, less well-seen than on 06/04/2022.  Suspected new lesion in the dome of the right hepatic lobe Cycle 36 gemcitabine/Abraxane 09/12/2022 Cycle 37 gemcitabine/Abraxane 09/26/2022 Cycle 38 gemcitabine/Abraxane 10/10/2022 Cycle 39 gemcitabine/Abraxane 11/07/2022 Cycle 40 gemcitabine/Abraxane 11/21/2022 Cycle 41 gemcitabine/Abraxane 12/05/2022 Cycle 42 gemcitabine/Abraxane 12/19/2022 CT is 12/30/2022-new left hepatic lesion, stable bilateral pulmonary metastases Cycle 43 gemcitabine/Abraxane 01/02/2023 CT-guided biopsy of liver lesion 01/27/2023-acute/chronic inflammation, negative for malignancy, culture negative Cycle 44 gemcitabine/Abraxane 01/30/2023 Cycle 45 gemcitabine/Abraxane 02/13/2023   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  Apixaban 10/24/2022      Disposition: Alyssa Ross appears stable.  She has completed 44 cycles of gemcitabine/Abraxane.  She continues to tolerate treatment well.  There is no clinical evidence of disease progression.  Plan to proceed with treatment today as scheduled.  CBC and chemistry panel reviewed.  Labs adequate to proceed as above.  She will return for follow-up and treatment in 2 weeks.    Lonna Cobb ANP/GNP-BC    02/13/2023  9:48 AM

## 2023-02-13 NOTE — Patient Instructions (Signed)
[facility]   Discharge Instructions: Thank you for choosing [facility] to provide your oncology and hematology care.   If you have a lab appointment with the Cancer Center, please go directly to the Cancer Center and check in at the registration area.   Wear comfortable clothing and clothing appropriate for easy access to any Portacath or PICC line.   We strive to give you quality time with your provider. You may need to reschedule your appointment if you arrive late (15 or more minutes).  Arriving late affects you and other patients whose appointments are after yours.  Also, if you miss three or more appointments without notifying the office, you may be dismissed from the clinic at the provider's discretion.      For prescription refill requests, have your pharmacy contact our office and allow 72 hours for refills to be completed.    Today you received the following chemotherapy and/or immunotherapy agents Paclitaxel-protein bound (ABRAXANE) & Gemcitabine (GEMZAR).   To help prevent nausea and vomiting after your treatment, we encourage you to take your nausea medication as directed.  BELOW ARE SYMPTOMS THAT SHOULD BE REPORTED IMMEDIATELY: *FEVER GREATER THAN 100.4 F (38 C) OR HIGHER *CHILLS OR SWEATING *NAUSEA AND VOMITING THAT IS NOT CONTROLLED WITH YOUR NAUSEA MEDICATION *UNUSUAL SHORTNESS OF BREATH *UNUSUAL BRUISING OR BLEEDING *URINARY PROBLEMS (pain or burning when urinating, or frequent urination) *BOWEL PROBLEMS (unusual diarrhea, constipation, pain near the anus) TENDERNESS IN MOUTH AND THROAT WITH OR WITHOUT PRESENCE OF ULCERS (sore throat, sores in mouth, or a toothache) UNUSUAL RASH, SWELLING OR PAIN  UNUSUAL VAGINAL DISCHARGE OR ITCHING   Items with * indicate a potential emergency and should be followed up as soon as possible or go to the Emergency Department if any problems should occur.  Please show the CHEMOTHERAPY  ALERT CARD or IMMUNOTHERAPY ALERT CARD at check-in to the Emergency Department and triage nurse.  Should you have questions after your visit or need to cancel or reschedule your appointment, please contact [facility]  Dept: [phone]  and follow the prompts.  Office hours are 8:00 a.m. to 4:30 p.m. Monday - Friday. Please note that voicemails left after 4:00 p.m. may not be returned until the following business day.  We are closed weekends and major holidays. You have access to a nurse at all times for urgent questions. Please call the main number to the clinic Dept: [phone] and follow the prompts.   For any non-urgent questions, you may also contact your provider using MyChart. We now offer e-Visits for anyone 18 and older to request care online for non-urgent symptoms. For details visit [email or web address].   Also download the MyChart app! Go to the app store, search "MyChart", open the app, select [facility], and log in with your MyChart username and password.  Paclitaxel Nanoparticle Albumin-Bound Injection What is this medication? NANOPARTICLE ALBUMIN-BOUND PACLITAXEL (Na no PAHR ti kuhl al BYOO muhn-bound PAK li TAX el) treats some types of cancer. It works by slowing down the growth of cancer cells. This medicine may be used for other purposes; ask your health care provider or pharmacist if you have questions. COMMON BRAND NAME(S): Abraxane What should I tell my care team before I take this medication? They need to know if you have any of these conditions: Liver disease Low white blood cell levels An unusual or allergic reaction to paclitaxel, albumin, other medications, foods, dyes, or preservatives   If you or your partner are pregnant or trying to get pregnant Breast-feeding How should I use this medication? This medication is injected into a vein. It is given by your care team in a hospital or clinic setting. Talk to your care team  about the use of this medication in children. Special care may be needed. Overdosage: If you think you have taken too much of this medicine contact a poison control center or emergency room at once. NOTE: This medicine is only for you. Do not share this medicine with others. What if I miss a dose? Keep appointments for follow-up doses. It is important not to miss your dose. Call your care team if you are unable to keep an appointment. What may interact with this medication? Other medications may affect the way this medication works. Talk with your care team about all of the medications you take. They may suggest changes to your treatment plan to lower the risk of side effects and to make sure your medications work as intended. This list may not describe all possible interactions. Give your health care provider a list of all the medicines, herbs, non-prescription drugs, or dietary supplements you use. Also tell them if you smoke, drink alcohol, or use illegal drugs. Some items may interact with your medicine. What should I watch for while using this medication? Your condition will be monitored carefully while you are receiving this medication. You may need blood work while taking this medication. This medication may make you feel generally unwell. This is not uncommon as chemotherapy can affect healthy cells as well as cancer cells. Report any side effects. Continue your course of treatment even though you feel ill unless your care team tells you to stop. This medication can cause serious allergic reactions. To reduce the risk, your care team may give you other medications to take before receiving this one. Be sure to follow the directions from your care team. This medication may increase your risk of getting an infection. Call your care team for advice if you get a fever, chills, sore throat, or other symptoms of a cold or flu. Do not treat yourself. Try to avoid being around people who are sick. This  medication may increase your risk to bruise or bleed. Call your care team if you notice any unusual bleeding. Be careful brushing or flossing your teeth or using a toothpick because you may get an infection or bleed more easily. If you have any dental work done, tell your dentist you are receiving this medication. Talk to your care team if you or your partner may be pregnant. Serious birth defects can occur if you take this medication during pregnancy and for 6 months after the last dose. You will need a negative pregnancy test before starting this medication. Contraception is recommended while taking this medication and for 6 months after the last dose. Your care team can help you find the option that works for you. If your partner can get pregnant, use a condom during sex while taking this medication and for 3 months after the last dose. Do not breastfeed while taking this medication and for 2 weeks after the last dose. This medication may cause infertility. Talk to your care team if you are concerned about your fertility. What side effects may I notice from receiving this medication? Side effects that you should report to your care team as soon as possible: Allergic reactions--skin rash, itching, hives, swelling of the face, lips, tongue, or throat Dry cough,   shortness of breath or trouble breathing Infection--fever, chills, cough, sore throat, wounds that don't heal, pain or trouble when passing urine, general feeling of discomfort or being unwell Low red blood cell level--unusual weakness or fatigue, dizziness, headache, trouble breathing Pain, tingling, or numbness in the hands or feet Stomach pain, unusual weakness or fatigue, nausea, vomiting, diarrhea, or fever that lasts longer than expected Unusual bruising or bleeding Side effects that usually do not require medical attention (report to your care team if they continue or are bothersome): Diarrhea Fatigue Hair loss Loss of  appetite Nausea Vomiting This list may not describe all possible side effects. Call your doctor for medical advice about side effects. You may report side effects to FDA at 1-800-FDA-1088. Where should I keep my medication? This medication is given in a hospital or clinic. It will not be stored at home. NOTE: This sheet is a summary. It may not cover all possible information. If you have questions about this medicine, talk to your doctor, pharmacist, or health care provider.  2024 Elsevier/Gold Standard (2021-10-11 00:00:00)  Gemcitabine Injection What is this medication? GEMCITABINE (jem SYE ta been) treats some types of cancer. It works by slowing down the growth of cancer cells. This medicine may be used for other purposes; ask your health care provider or pharmacist if you have questions. COMMON BRAND NAME(S): Gemzar, Infugem What should I tell my care team before I take this medication? They need to know if you have any of these conditions: Blood disorders Infection Kidney disease Liver disease Lung or breathing disease, such as asthma or COPD Recent or ongoing radiation therapy An unusual or allergic reaction to gemcitabine, other medications, foods, dyes, or preservatives If you or your partner are pregnant or trying to get pregnant Breast-feeding How should I use this medication? This medication is injected into a vein. It is given by your care team in a hospital or clinic setting. Talk to your care team about the use of this medication in children. Special care may be needed. Overdosage: If you think you have taken too much of this medicine contact a poison control center or emergency room at once. NOTE: This medicine is only for you. Do not share this medicine with others. What if I miss a dose? Keep appointments for follow-up doses. It is important not to miss your dose. Call your care team if you are unable to keep an appointment. What may interact with this  medication? Interactions have not been studied. This list may not describe all possible interactions. Give your health care provider a list of all the medicines, herbs, non-prescription drugs, or dietary supplements you use. Also tell them if you smoke, drink alcohol, or use illegal drugs. Some items may interact with your medicine. What should I watch for while using this medication? Your condition will be monitored carefully while you are receiving this medication. This medication may make you feel generally unwell. This is not uncommon, as chemotherapy can affect healthy cells as well as cancer cells. Report any side effects. Continue your course of treatment even though you feel ill unless your care team tells you to stop. In some cases, you may be given additional medications to help with side effects. Follow all directions for their use. This medication may increase your risk of getting an infection. Call your care team for advice if you get a fever, chills, sore throat, or other symptoms of a cold or flu. Do not treat yourself. Try to avoid being around   people who are sick. This medication may increase your risk to bruise or bleed. Call your care team if you notice any unusual bleeding. Be careful brushing or flossing your teeth or using a toothpick because you may get an infection or bleed more easily. If you have any dental work done, tell your dentist you are receiving this medication. Avoid taking medications that contain aspirin, acetaminophen, ibuprofen, naproxen, or ketoprofen unless instructed by your care team. These medications may hide a fever. Talk to your care team if you or your partner wish to become pregnant or think you might be pregnant. This medication can cause serious birth defects if taken during pregnancy and for 6 months after the last dose. A negative pregnancy test is required before starting this medication. A reliable form of contraception is recommended while taking  this medication and for 6 months after the last dose. Talk to your care team about effective forms of contraception. Do not father a child while taking this medication and for 3 months after the last dose. Use a condom while having sex during this time period. Do not breastfeed while taking this medication and for at least 1 week after the last dose. This medication may cause infertility. Talk to your care team if you are concerned about your fertility. What side effects may I notice from receiving this medication? Side effects that you should report to your care team as soon as possible: Allergic reactions--skin rash, itching, hives, swelling of the face, lips, tongue, or throat Capillary leak syndrome--stomach or muscle pain, unusual weakness or fatigue, feeling faint or lightheaded, decrease in the amount of urine, swelling of the ankles, hands, or feet, trouble breathing Infection--fever, chills, cough, sore throat, wounds that don't heal, pain or trouble when passing urine, general feeling of discomfort or being unwell Liver injury--right upper belly pain, loss of appetite, nausea, light-colored stool, dark yellow or brown urine, yellowing skin or eyes, unusual weakness or fatigue Low red blood cell level--unusual weakness or fatigue, dizziness, headache, trouble breathing Lung injury--shortness of breath or trouble breathing, cough, spitting up blood, chest pain, fever Stomach pain, bloody diarrhea, pale skin, unusual weakness or fatigue, decrease in the amount of urine, which may be signs of hemolytic uremic syndrome Sudden and severe headache, confusion, change in vision, seizures, which may be signs of posterior reversible encephalopathy syndrome (PRES) Unusual bruising or bleeding Side effects that usually do not require medical attention (report to your care team if they continue or are bothersome): Diarrhea Drowsiness Hair loss Nausea Pain, redness, or swelling with sores inside the  mouth or throat Vomiting This list may not describe all possible side effects. Call your doctor for medical advice about side effects. You may report side effects to FDA at 1-800-FDA-1088. Where should I keep my medication? This medication is given in a hospital or clinic. It will not be stored at home. NOTE: This sheet is a summary. It may not cover all possible information. If you have questions about this medicine, talk to your doctor, pharmacist, or health care provider.  2024 Elsevier/Gold Standard (2021-10-02 00:00:00)  

## 2023-02-14 ENCOUNTER — Ambulatory Visit: Payer: BC Managed Care – PPO

## 2023-02-14 ENCOUNTER — Other Ambulatory Visit: Payer: BC Managed Care – PPO

## 2023-02-14 ENCOUNTER — Ambulatory Visit: Payer: BC Managed Care – PPO | Admitting: Nurse Practitioner

## 2023-02-14 LAB — CANCER ANTIGEN 19-9: CA 19-9: 163 U/mL — ABNORMAL HIGH (ref 0–35)

## 2023-02-18 DIAGNOSIS — E109 Type 1 diabetes mellitus without complications: Secondary | ICD-10-CM | POA: Diagnosis not present

## 2023-02-18 DIAGNOSIS — Z794 Long term (current) use of insulin: Secondary | ICD-10-CM | POA: Diagnosis not present

## 2023-02-18 DIAGNOSIS — E119 Type 2 diabetes mellitus without complications: Secondary | ICD-10-CM | POA: Diagnosis not present

## 2023-02-20 ENCOUNTER — Telehealth: Payer: Self-pay

## 2023-02-20 ENCOUNTER — Other Ambulatory Visit: Payer: BC Managed Care – PPO

## 2023-02-20 ENCOUNTER — Other Ambulatory Visit: Payer: Self-pay

## 2023-02-20 ENCOUNTER — Encounter: Payer: Self-pay | Admitting: Oncology

## 2023-02-20 DIAGNOSIS — C250 Malignant neoplasm of head of pancreas: Secondary | ICD-10-CM

## 2023-02-20 NOTE — Telephone Encounter (Signed)
The patient reports feeling sluggish and would like to assess whether her hemoglobin levels have dropped. I have scheduled her for a complete blood count (CBC) and type and screen. The patient indicated that she is available for a blood infusion on Monday.

## 2023-02-21 ENCOUNTER — Inpatient Hospital Stay: Payer: BC Managed Care – PPO

## 2023-02-21 ENCOUNTER — Other Ambulatory Visit: Payer: Self-pay

## 2023-02-21 DIAGNOSIS — C7801 Secondary malignant neoplasm of right lung: Secondary | ICD-10-CM | POA: Diagnosis not present

## 2023-02-21 DIAGNOSIS — Z5111 Encounter for antineoplastic chemotherapy: Secondary | ICD-10-CM | POA: Diagnosis not present

## 2023-02-21 DIAGNOSIS — C250 Malignant neoplasm of head of pancreas: Secondary | ICD-10-CM

## 2023-02-21 DIAGNOSIS — Z905 Acquired absence of kidney: Secondary | ICD-10-CM | POA: Diagnosis not present

## 2023-02-21 DIAGNOSIS — E1140 Type 2 diabetes mellitus with diabetic neuropathy, unspecified: Secondary | ICD-10-CM | POA: Diagnosis not present

## 2023-02-21 DIAGNOSIS — K769 Liver disease, unspecified: Secondary | ICD-10-CM | POA: Diagnosis not present

## 2023-02-21 DIAGNOSIS — Z85528 Personal history of other malignant neoplasm of kidney: Secondary | ICD-10-CM | POA: Diagnosis not present

## 2023-02-21 DIAGNOSIS — C3411 Malignant neoplasm of upper lobe, right bronchus or lung: Secondary | ICD-10-CM | POA: Diagnosis not present

## 2023-02-21 DIAGNOSIS — Z95828 Presence of other vascular implants and grafts: Secondary | ICD-10-CM

## 2023-02-21 DIAGNOSIS — Z79899 Other long term (current) drug therapy: Secondary | ICD-10-CM | POA: Diagnosis not present

## 2023-02-21 DIAGNOSIS — C7802 Secondary malignant neoplasm of left lung: Secondary | ICD-10-CM | POA: Diagnosis not present

## 2023-02-21 DIAGNOSIS — I10 Essential (primary) hypertension: Secondary | ICD-10-CM | POA: Diagnosis not present

## 2023-02-21 LAB — CBC WITH DIFFERENTIAL (CANCER CENTER ONLY)
Abs Immature Granulocytes: 0.03 10*3/uL (ref 0.00–0.07)
Basophils Absolute: 0 10*3/uL (ref 0.0–0.1)
Basophils Relative: 1 %
Eosinophils Absolute: 0.1 10*3/uL (ref 0.0–0.5)
Eosinophils Relative: 1 %
HCT: 26.6 % — ABNORMAL LOW (ref 36.0–46.0)
Hemoglobin: 8.2 g/dL — ABNORMAL LOW (ref 12.0–15.0)
Immature Granulocytes: 1 %
Lymphocytes Relative: 20 %
Lymphs Abs: 1 10*3/uL (ref 0.7–4.0)
MCH: 27.7 pg (ref 26.0–34.0)
MCHC: 30.8 g/dL (ref 30.0–36.0)
MCV: 89.9 fL (ref 80.0–100.0)
Monocytes Absolute: 0.3 10*3/uL (ref 0.1–1.0)
Monocytes Relative: 7 %
Neutro Abs: 3.4 10*3/uL (ref 1.7–7.7)
Neutrophils Relative %: 70 %
Platelet Count: 356 10*3/uL (ref 150–400)
RBC: 2.96 MIL/uL — ABNORMAL LOW (ref 3.87–5.11)
RDW: 14.6 % (ref 11.5–15.5)
WBC Count: 4.8 10*3/uL (ref 4.0–10.5)
nRBC: 0 % (ref 0.0–0.2)

## 2023-02-21 LAB — SAMPLE TO BLOOD BANK

## 2023-02-21 LAB — PREPARE RBC (CROSSMATCH)

## 2023-02-21 MED ORDER — HEPARIN SOD (PORK) LOCK FLUSH 100 UNIT/ML IV SOLN
500.0000 [IU] | Freq: Once | INTRAVENOUS | Status: AC
  Administered 2023-02-21: 500 [IU]

## 2023-02-21 MED ORDER — SODIUM CHLORIDE 0.9% FLUSH
10.0000 mL | Freq: Once | INTRAVENOUS | Status: AC
  Administered 2023-02-21: 10 mL

## 2023-02-21 NOTE — Patient Instructions (Signed)
Implanted [facility] Guide An implanted port is a device that is placed under the skin. It is usually placed in the chest. The device may vary based on the need. Implanted ports can be used to give IV medicine, to take blood, or to give fluids. You may have an implanted port if: You need IV medicine that would be irritating to the small veins in your hands or arms. You need IV medicines, such as chemotherapy, for a long period of time. You need IV nutrition for a long period of time. You may have fewer limitations when using a port than you would if you used other types of long-term IVs. You will also likely be able to return to normal activities after your incision heals. An implanted port has two main parts: Reservoir. The reservoir is the part where a needle is inserted to give medicines or draw blood. The reservoir is round. After the port is placed, it appears as a small, raised area under your skin. Catheter. The catheter is a small, thin tube that connects the reservoir to a vein. Medicine that is inserted into the reservoir goes into the catheter and then into the vein. How is my port accessed? To access your port: A numbing cream may be placed on the skin over the port site. Your health care provider will put on a mask and sterile gloves. The skin over your port will be cleaned carefully with a germ-killing soap and allowed to dry. Your health care provider will gently pinch the port and insert a needle into it. Your health care provider will check for a blood return to make sure the port is in the vein and is still working (patent). If your port needs to remain accessed to get medicine continuously (constant infusion), your health care provider will place a clear bandage (dressing) over the needle site. The dressing and needle will need to be changed every week, or as told by your health care provider. What is flushing? Flushing helps keep the port working. Follow instructions from your  health care provider about how and when to flush the port. Ports are usually flushed with saline solution or a medicine called heparin. The need for flushing will depend on how the port is used: If the port is only used from time to time to give medicines or draw blood, the port may need to be flushed: Before and after medicines have been given. Before and after blood has been drawn. As part of routine maintenance. Flushing may be recommended every 4-6 weeks. If a constant infusion is running, the port may not need to be flushed. Throw away any syringes in a disposal container that is meant for sharp items (sharps container). You can buy a sharps container from a pharmacy, or you can make one by using an empty hard plastic bottle with a cover. How long will my port stay implanted? The port can stay in for as long as your health care provider thinks it is needed. When it is time for the port to come out, a surgery will be done to remove it. The surgery will be similar to the procedure that was done to put the port in. Follow these instructions at home: Caring for your port and port site Flush your port as told by your health care provider. If you need an infusion over several days, follow instructions from your health care provider about how to take care of your port site. Make sure you: Change your  dressing as told by your health care provider. Wash your hands with soap and water for at least 20 seconds before and after you change your dressing. If soap and water are not available, use alcohol-based hand sanitizer. Place any used dressings or infusion bags into a plastic bag. Throw that bag in the trash. Keep the dressing that covers the needle clean and dry. Do not get it wet. Do not use scissors or sharp objects near the infusion tubing. Keep any external tubes clamped, unless they are being used. Check your port site every day for signs of infection. Check for: Redness, swelling, or  pain. Fluid or blood. Warmth. Pus or a bad smell. Protect the skin around the port site. Avoid wearing bra straps that rub or irritate the site. Protect the skin around your port from seat belts. Place a soft pad over your chest if needed. Bathe or shower as told by your health care provider. The site may get wet as long as you are not actively receiving an infusion. General instructions  Return to your normal activities as told by your health care provider. Ask your health care provider what activities are safe for you. Carry a medical alert card or wear a medical alert bracelet at all times. This will let health care providers know that you have an implanted port in case of an emergency. Where to find more information American Cancer Society: www.cancer.org American Society of Clinical Oncology: www.cancer.net Contact a health care provider if: You have a fever or chills. You have redness, swelling, or pain at the port site. You have fluid or blood coming from your port site. Your incision feels warm to the touch. You have pus or a bad smell coming from the port site. Summary Implanted ports are usually placed in the chest for long-term IV access. Follow instructions from your health care provider about flushing the port and changing bandages (dressings). Take care of the area around your port by avoiding clothing that puts pressure on the area, and by watching for signs of infection. Protect the skin around your port from seat belts. Place a soft pad over your chest if needed. Contact a health care provider if you have a fever or you have redness, swelling, pain, fluid, or a bad smell at the port site. This information is not intended to replace advice given to you by your health care provider. Make sure you discuss any questions you have with your health care provider. Document Revised: 11/28/2020 Document Reviewed: 11/28/2020 Elsevier Patient Education  2024 [employer].

## 2023-02-22 ENCOUNTER — Other Ambulatory Visit: Payer: Self-pay | Admitting: Oncology

## 2023-02-24 ENCOUNTER — Other Ambulatory Visit: Payer: Self-pay | Admitting: Nurse Practitioner

## 2023-02-24 ENCOUNTER — Inpatient Hospital Stay: Payer: BC Managed Care – PPO

## 2023-02-24 DIAGNOSIS — Z85528 Personal history of other malignant neoplasm of kidney: Secondary | ICD-10-CM | POA: Diagnosis not present

## 2023-02-24 DIAGNOSIS — E1140 Type 2 diabetes mellitus with diabetic neuropathy, unspecified: Secondary | ICD-10-CM | POA: Diagnosis not present

## 2023-02-24 DIAGNOSIS — Z79899 Other long term (current) drug therapy: Secondary | ICD-10-CM | POA: Diagnosis not present

## 2023-02-24 DIAGNOSIS — C7801 Secondary malignant neoplasm of right lung: Secondary | ICD-10-CM | POA: Diagnosis not present

## 2023-02-24 DIAGNOSIS — C3411 Malignant neoplasm of upper lobe, right bronchus or lung: Secondary | ICD-10-CM | POA: Diagnosis not present

## 2023-02-24 DIAGNOSIS — C250 Malignant neoplasm of head of pancreas: Secondary | ICD-10-CM

## 2023-02-24 DIAGNOSIS — Z5111 Encounter for antineoplastic chemotherapy: Secondary | ICD-10-CM | POA: Diagnosis not present

## 2023-02-24 DIAGNOSIS — I10 Essential (primary) hypertension: Secondary | ICD-10-CM | POA: Diagnosis not present

## 2023-02-24 DIAGNOSIS — C7802 Secondary malignant neoplasm of left lung: Secondary | ICD-10-CM | POA: Diagnosis not present

## 2023-02-24 DIAGNOSIS — K769 Liver disease, unspecified: Secondary | ICD-10-CM | POA: Diagnosis not present

## 2023-02-24 DIAGNOSIS — Z905 Acquired absence of kidney: Secondary | ICD-10-CM | POA: Diagnosis not present

## 2023-02-24 MED ORDER — HEPARIN SOD (PORK) LOCK FLUSH 100 UNIT/ML IV SOLN
500.0000 [IU] | Freq: Every day | INTRAVENOUS | Status: AC | PRN
  Administered 2023-02-24: 500 [IU]

## 2023-02-24 MED ORDER — SODIUM CHLORIDE 0.9% IV SOLUTION
250.0000 mL | Freq: Once | INTRAVENOUS | Status: AC
  Administered 2023-02-24: 250 mL via INTRAVENOUS

## 2023-02-24 MED ORDER — SODIUM CHLORIDE 0.9% FLUSH
10.0000 mL | INTRAVENOUS | Status: AC | PRN
  Administered 2023-02-24: 10 mL

## 2023-02-24 NOTE — Patient Instructions (Signed)

## 2023-02-25 LAB — TYPE AND SCREEN
ABO/RH(D): AB POS
Antibody Screen: NEGATIVE
Unit division: 0
Unit division: 0

## 2023-02-25 LAB — BPAM RBC
Blood Product Expiration Date: 202410062359
Blood Product Expiration Date: 202410102359
ISSUE DATE / TIME: 202409160641
ISSUE DATE / TIME: 202409160641
Unit Type and Rh: 6200
Unit Type and Rh: 6200

## 2023-02-26 ENCOUNTER — Ambulatory Visit: Payer: BC Managed Care – PPO | Admitting: Dermatology

## 2023-02-27 ENCOUNTER — Encounter: Payer: Self-pay | Admitting: *Deleted

## 2023-02-27 ENCOUNTER — Inpatient Hospital Stay: Payer: BC Managed Care – PPO

## 2023-02-27 ENCOUNTER — Inpatient Hospital Stay: Payer: BC Managed Care – PPO | Admitting: Oncology

## 2023-02-27 VITALS — BP 125/85 | HR 86 | Temp 98.1°F | Resp 18 | Ht 68.0 in | Wt 195.0 lb

## 2023-02-27 DIAGNOSIS — C250 Malignant neoplasm of head of pancreas: Secondary | ICD-10-CM

## 2023-02-27 DIAGNOSIS — Z79899 Other long term (current) drug therapy: Secondary | ICD-10-CM | POA: Diagnosis not present

## 2023-02-27 DIAGNOSIS — E876 Hypokalemia: Secondary | ICD-10-CM

## 2023-02-27 DIAGNOSIS — C7802 Secondary malignant neoplasm of left lung: Secondary | ICD-10-CM | POA: Diagnosis not present

## 2023-02-27 DIAGNOSIS — I10 Essential (primary) hypertension: Secondary | ICD-10-CM | POA: Diagnosis not present

## 2023-02-27 DIAGNOSIS — Z5111 Encounter for antineoplastic chemotherapy: Secondary | ICD-10-CM | POA: Diagnosis not present

## 2023-02-27 DIAGNOSIS — Z85528 Personal history of other malignant neoplasm of kidney: Secondary | ICD-10-CM | POA: Diagnosis not present

## 2023-02-27 DIAGNOSIS — E1140 Type 2 diabetes mellitus with diabetic neuropathy, unspecified: Secondary | ICD-10-CM | POA: Diagnosis not present

## 2023-02-27 DIAGNOSIS — K769 Liver disease, unspecified: Secondary | ICD-10-CM | POA: Diagnosis not present

## 2023-02-27 DIAGNOSIS — C3411 Malignant neoplasm of upper lobe, right bronchus or lung: Secondary | ICD-10-CM | POA: Diagnosis not present

## 2023-02-27 DIAGNOSIS — C7801 Secondary malignant neoplasm of right lung: Secondary | ICD-10-CM | POA: Diagnosis not present

## 2023-02-27 DIAGNOSIS — Z95828 Presence of other vascular implants and grafts: Secondary | ICD-10-CM

## 2023-02-27 DIAGNOSIS — Z905 Acquired absence of kidney: Secondary | ICD-10-CM | POA: Diagnosis not present

## 2023-02-27 LAB — CMP (CANCER CENTER ONLY)
ALT: 12 U/L (ref 0–44)
AST: 13 U/L — ABNORMAL LOW (ref 15–41)
Albumin: 3.3 g/dL — ABNORMAL LOW (ref 3.5–5.0)
Alkaline Phosphatase: 160 U/L — ABNORMAL HIGH (ref 38–126)
Anion gap: 6 (ref 5–15)
BUN: 8 mg/dL (ref 6–20)
CO2: 23 mmol/L (ref 22–32)
Calcium: 8.5 mg/dL — ABNORMAL LOW (ref 8.9–10.3)
Chloride: 108 mmol/L (ref 98–111)
Creatinine: 1.03 mg/dL — ABNORMAL HIGH (ref 0.44–1.00)
GFR, Estimated: >60 mL/min (ref >60)
Glucose, Bld: 162 mg/dL — ABNORMAL HIGH (ref 70–99)
Potassium: 3.4 mmol/L — ABNORMAL LOW (ref 3.5–5.1)
Sodium: 137 mmol/L (ref 135–145)
Total Bilirubin: 0.6 mg/dL (ref 0.3–1.2)
Total Protein: 7.6 g/dL (ref 6.5–8.1)

## 2023-02-27 LAB — CBC WITH DIFFERENTIAL (CANCER CENTER ONLY)
Abs Immature Granulocytes: 0.02 10*3/uL (ref 0.00–0.07)
Basophils Absolute: 0 10*3/uL (ref 0.0–0.1)
Basophils Relative: 0 %
Eosinophils Absolute: 0.1 10*3/uL (ref 0.0–0.5)
Eosinophils Relative: 2 %
HCT: 30.8 % — ABNORMAL LOW (ref 36.0–46.0)
Hemoglobin: 9.7 g/dL — ABNORMAL LOW (ref 12.0–15.0)
Immature Granulocytes: 0 %
Lymphocytes Relative: 13 %
Lymphs Abs: 0.9 10*3/uL (ref 0.7–4.0)
MCH: 27.4 pg (ref 26.0–34.0)
MCHC: 31.5 g/dL (ref 30.0–36.0)
MCV: 87 fL (ref 80.0–100.0)
Monocytes Absolute: 1.1 10*3/uL — ABNORMAL HIGH (ref 0.1–1.0)
Monocytes Relative: 15 %
Neutro Abs: 4.7 10*3/uL (ref 1.7–7.7)
Neutrophils Relative %: 70 %
Platelet Count: 264 10*3/uL (ref 150–400)
RBC: 3.54 MIL/uL — ABNORMAL LOW (ref 3.87–5.11)
RDW: 14.9 % (ref 11.5–15.5)
WBC Count: 6.8 10*3/uL (ref 4.0–10.5)
nRBC: 0 % (ref 0.0–0.2)

## 2023-02-27 LAB — MAGNESIUM: Magnesium: 1.5 mg/dL — ABNORMAL LOW (ref 1.7–2.4)

## 2023-02-27 MED ORDER — MAGNESIUM SULFATE 2 GM/50ML IV SOLN
2.0000 g | Freq: Once | INTRAVENOUS | Status: AC
  Administered 2023-02-27: 2 g via INTRAVENOUS
  Filled 2023-02-27: qty 50

## 2023-02-27 MED ORDER — SODIUM CHLORIDE 0.9 % IV SOLN: Freq: Once | INTRAVENOUS | Status: AC

## 2023-02-27 MED ORDER — SODIUM CHLORIDE 0.9% FLUSH
10.0000 mL | INTRAVENOUS | Status: DC | PRN
  Administered 2023-02-27: 10 mL

## 2023-02-27 MED ORDER — SODIUM CHLORIDE 0.9 % IV SOLN
2000.0000 mg | Freq: Once | INTRAVENOUS | Status: AC
  Administered 2023-02-27: 2000 mg via INTRAVENOUS
  Filled 2023-02-27: qty 52.6

## 2023-02-27 MED ORDER — PACLITAXEL PROTEIN-BOUND CHEMO INJECTION 100 MG
100.0000 mg/m2 | Freq: Once | INTRAVENOUS | Status: AC
  Administered 2023-02-27: 200 mg via INTRAVENOUS
  Filled 2023-02-27: qty 40

## 2023-02-27 MED ORDER — POTASSIUM CHLORIDE 10 MEQ/100ML IV SOLN
10.0000 meq | Freq: Once | INTRAVENOUS | Status: AC
  Administered 2023-02-27: 10 meq via INTRAVENOUS
  Filled 2023-02-27: qty 100

## 2023-02-27 MED ORDER — HEPARIN SOD (PORK) LOCK FLUSH 100 UNIT/ML IV SOLN
500.0000 [IU] | Freq: Once | INTRAVENOUS | Status: AC | PRN
  Administered 2023-02-27: 500 [IU]

## 2023-02-27 MED ORDER — PROCHLORPERAZINE MALEATE 10 MG PO TABS
10.0000 mg | ORAL_TABLET | Freq: Once | ORAL | Status: AC
  Administered 2023-02-27: 10 mg via ORAL
  Filled 2023-02-27: qty 1

## 2023-02-27 MED ORDER — SODIUM CHLORIDE 0.9% FLUSH
10.0000 mL | Freq: Once | INTRAVENOUS | Status: AC
  Administered 2023-02-27: 10 mL

## 2023-02-27 NOTE — Progress Notes (Signed)
[facility] OFFICE PROGRESS NOTE   Diagnosis: Pancreas cancer  INTERVAL HISTORY:   Ms. Aring completed on cycle of gemcitabine/Abraxane 02/13/2023.  No fever or rash.  She reports the foot numbness intermittently increases and returns to baseline.  No new complaint. She received a Red cell transfusion 02/24/2023.  She reports improvement in her energy level after the transfusion.    Objective:  Vital signs in last 24 hours:  Blood pressure 125/85, pulse 86, temperature 98.1 F (36.7 C), temperature source Temporal, resp. rate 18, height 5\' 8"  (1.727 m), weight 195 lb (88.5 kg), last menstrual period 09/17/2012, SpO2 100%.    HEENT: No thrush or ulcers Resp: Lungs clear bilaterally Cardio: Regular rate and rhythm GI: No hepatosplenomegaly, no mass, nontender Vascular: No leg edema    Portacath/PICC-without erythema  Lab Results:  Lab Results  Component Value Date   WBC 6.8 02/27/2023   HGB 9.7 (L) 02/27/2023   HCT 30.8 (L) 02/27/2023   MCV 87.0 02/27/2023   PLT 264 02/27/2023   NEUTROABS 4.7 02/27/2023    CMP  Lab Results  Component Value Date   NA 134 (L) 02/13/2023   K 4.0 02/13/2023   CL 104 02/13/2023   CO2 24 02/13/2023   GLUCOSE 309 (H) 02/13/2023   BUN 9 02/13/2023   CREATININE 1.11 (H) 02/13/2023   CALCIUM 8.3 (L) 02/13/2023   PROT 7.6 02/13/2023   ALBUMIN 3.5 02/13/2023   AST 14 (L) 02/13/2023   ALT 16 02/13/2023   ALKPHOS 187 (H) 02/13/2023   BILITOT 0.4 02/13/2023   GFRNONAA 58 (L) 02/13/2023   GFRAA >60 02/07/2020    Lab Results  Component Value Date   [id number] 163 (H) 02/13/2023    Medications: I have reviewed the patient's current medications.   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 Tumor invades the duodenal wall and vascular groove, resection margins negative, 4/34 lymph nodes positive MSI-stable, tumor showed instability in 2 loci as did adjacent normal  tissue Foundation 1-K-ras G12 V, microsatellite status and tumor mutation burden cannot be determined EUS FNA biopsy of pancreas mass on 07/03/2018-well-differentiated neuroendocrine tumor CTs 01/29/2019-ill-defined pancreas head mass, 5 pulmonary nodules-1 with a small amount of central cavitation, tumor abuts the left margin of the portal vein indistinct density surrounding, hepatic artery, complex cystic lesion of the right kidney, right adrenal mass-characterized as an adenoma on a [facility] MRI 12/21/2018 [facility] 03/03/2019-no focal pancreas activity, no tracer accumulation within the suspicious pulmonary nodules, left uterine mass with tracer accumulation felt to represent a leiomyoma Elevated preoperative CA 19-9--CA 19-9 186 on 01/18/2019 CT chest 04/16/2019-multiple bilateral pulmonary nodules, some with increased cavitation, stable in size Cycle 1 FOLFIRINOX 04/27/2019 Cycle 2 FOLFIRINOX 05/11/2019 Cycle 3 FOLFIRINOX 05/23/2019 Cycle 4 FOLFIRINOX 06/08/2019 Cycle 5 FOLFIRINOX 06/22/2019 CT chest 07/02/2019-stable size of bilateral pulmonary nodules.  Dominant cavitary lesions in both lungs show increased cavitation with thinner walls.  Stable 2.1 cm right adrenal nodule. 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 No definite evidence of metastatic disease in the abdomen or pelvis. Markedly enlarged and heterogeneous appearing uterus, likely to represent multifocal fibroids. 1 of these lesions appears to  be an exophytic subserosal fibroid in the posterior lateral aspect of the uterine body  on the left side although this comes in close proximity to the left adnexa such that a primary ovarian lesion is difficult to completely exclude. CTs 02/07/2020-slight enlargement of bilateral lung nodules, some are cavitary, no evidence of metastatic disease in the abdomen or pelvis, stable right adrenal nodule, uterine fibroids CTs 04/26/2020-mild enlargement of pulmonary nodules, slight increase in trace pelvic fluid, new soft tissue thickening inferior to the cecal tip suspicious for peritoneal metastasis CT 05/26/2020-improved appearance of soft tissue at the inferior tip of the cecum, mildly thickened short appendix-findings suggestive of resolving appendicitis, stable small bibasilar pulmonary nodules, fibroids Plan biopsy of right cecal tip soft tissue canceled secondary to radiologic improvement CT chest 08/02/2020-enlargement and progressive cavitation of multiple bilateral lung nodules.  Some new nodules are present. CTs 10/24/2020- increase in size of pulmonary nodules, no new nodules, no evidence of metastatic disease in the abdomen, stable right adrenal nodule CT 01/09/2021-slight interval enlargement of pulmonary nodules, stable right adrenal nodule Navigation bronchoscopy 01/30/2021-left lower lobe cavitary nodule FNA-adenocarcinoma, brushing-adenocarcinoma.  Left lower lobe lavage-adenocarcinoma.  Right upper lobe brushing and FNA biopsy of cavitary nodule-adenocarcinoma-immunohistochemical profile consistent with pancreas adenocarcinoma Cycle 1 gemcitabine/Abraxane 03/28/2021 Cycle 2 gemcitabine/Abraxane 04/11/2021 Cycle 3 gemcitabine/Abraxane 04/25/2021 Cycle 4 gemcitabine/Abraxane 05/09/2021 Cycle 5 gemcitabine/Abraxane 05/23/2021 CT chest 06/05/2021-interval cavitation of multiple pulmonary nodules, some nodules have decreased in size, no new or enlarging nodules Cycle 6 gemcitabine/Abraxane 06/06/2021 Cycle 7 gemcitabine/Abraxane 06/21/2021 Cycle 8 gemcitabine/Abraxane 07/05/2021 Cycle  9 Gemcitabine/Abraxane 07/19/2021 Cycle 10 gemcitabine 08/01/2021-Abraxane held secondary to neuropathy CT chest 08/13/2021-mild decrease in size and wall thickness of multiple cavitary nodules, no new or progressive disease in the chest, indeterminate low-attenuation right liver lesions Cycle 11 gemcitabine 08/16/2021-Abraxane held secondary to neuropathy Cycle 12 gemcitabine 08/30/2021-Abraxane held secondary to neuropathy Cycle 13 gemcitabine 09/13/2021-Abraxane held secondary to neuropathy Cycle 14 gemcitabine 09/27/2021-Abraxane held secondary to neuropathy Cycle 15 gemcitabine 10/11/2021-Abraxane held secondary to neuropathy CTs 10/22/2021-no change in multiple cavitary lung nodules, no evidence of disease progression, ill-defined hypodense lesion in the posterior right liver suspicious for metastatic disease Cycle 16 gemcitabine 10/25/2021 Cycle 17 gemcitabine 11/08/2021 Cycle 18 Gemcitabine 11/22/2021 Cycle 19 gemcitabine 12/06/2021 Cycle 20 Gemcitabine 12/20/2021 CT 12/31/2021-mild increase in size of bilateral pulmonary metastases, stable subtle continuation right liver lesions Cycle 20 gemcitabine/Abraxane 01/03/2022 Cycle 21 gemcitabine/Abraxane 01/17/2022 Cycle 22 gemcitabine/Abraxane 01/31/2022 Cycle 23 gemcitabine/Abraxane 02/14/2022 Cycle 24 gemcitabine/Abraxane 02/28/2022 CTs 03/11/2022-widespread metastatic disease to the lungs again noted with slight involution of several of the pulmonary nodules, no definite new nodules noted; interval cavitation of solid lesion in the posterior aspect right lobe of the liver, no new liver lesions noted. Cycle 25 Gemcitabine/Abraxane 03/14/2022 Cycle 26 gemcitabine/Abraxane 03/28/2022 Cycle 27 gemcitabine/Abraxane 04/25/2022 Cycle 28 gemcitabine/Abraxane 05/09/2022 Cycle 29 Gemcitabine 05/23/2022, Abraxane held due to neuropathy CTs 06/04/2022-stable multifocal cavitary pulmonary nodules.  No definite new nodules.  No new nodules greater than a centimeter.   Decreased size of the right posterior hepatic lobe lesion.  Generalized heterogeneity and new focal areas of hypodensity in the liver. Cycle 30 Gemcitabine 06/06/2022, Abraxane held due to neuropathy Cycle 31 gemcitabine 06/20/2022, Abraxane held due to neuropathy Cycle 32 gemcitabine 07/04/2022, Abraxane held due to neuropathy Cycle 33 gemcitabine  07/18/2022 ,Abraxane held due to neuropathy Cycle 34 gemcitabine 08/15/2022, Abraxane held due to neuropathy Cycle 35 gemcitabine 08/29/2022, Abraxane held due to neuropathy CTs 09/10/2022-enlarging pulmonary metastases.  Subtle poorly defined hypoattenuating lesions in the liver, less  well-seen than on 06/04/2022.  Suspected new lesion in the dome of the right hepatic lobe Cycle 36 gemcitabine/Abraxane 09/12/2022 Cycle 37 gemcitabine/Abraxane 09/26/2022 Cycle 38 gemcitabine/Abraxane 10/10/2022 Cycle 39 gemcitabine/Abraxane 11/07/2022 Cycle 40 gemcitabine/Abraxane 11/21/2022 Cycle 41 gemcitabine/Abraxane 12/05/2022 Cycle 42 gemcitabine/Abraxane 12/19/2022 CT is 12/30/2022-new left hepatic lesion, stable bilateral pulmonary metastases Cycle 43 gemcitabine/Abraxane 01/02/2023 CT-guided biopsy of liver lesion 01/27/2023-acute/chronic inflammation, negative for malignancy, culture negative Cycle 44 gemcitabine/Abraxane 01/30/2023 Cycle 45 gemcitabine/Abraxane 02/13/2023 Cycle 46 gemcitabine/Abraxane 02/27/2023   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  Apixaban 10/24/2022        Disposition: Alyssa Ross appears stable.  There is no clinical evidence for progression of the pancreas cancer.  The CA 19-9 has been stable for the past several months.  She will complete another cycle of gemcitabine/Abraxane today.  She will contact us for increased neuropathy symptoms.  She will be referred for a restaging chest CT within the next 1-2 months.  She will receive potassium and magnesium supplementation today.  Thornton Papas, MD  02/27/2023  9:31 AM

## 2023-02-27 NOTE — Patient Instructions (Signed)
[facility]  Discharge Instructions: Thank you for choosing [facility] to provide your oncology and hematology care.   If you have a lab appointment with the Cancer Center, please go directly to the Cancer Center and check in at the registration area.   Wear comfortable clothing and clothing appropriate for easy access to any Portacath or PICC line.   We strive to give you quality time with your provider. You may need to reschedule your appointment if you arrive late (15 or more minutes).  Arriving late affects you and other patients whose appointments are after yours.  Also, if you miss three or more appointments without notifying the office, you may be dismissed from the clinic at the provider's discretion.      For prescription refill requests, have your pharmacy contact our office and allow 72 hours for refills to be completed.    Today you received the following chemotherapy and/or immunotherapy agents Abraxane and Gemzar       To help prevent nausea and vomiting after your treatment, we encourage you to take your nausea medication as directed.  BELOW ARE SYMPTOMS THAT SHOULD BE REPORTED IMMEDIATELY: *FEVER GREATER THAN 100.4 F (38 C) OR HIGHER *CHILLS OR SWEATING *NAUSEA AND VOMITING THAT IS NOT CONTROLLED WITH YOUR NAUSEA MEDICATION *UNUSUAL SHORTNESS OF BREATH *UNUSUAL BRUISING OR BLEEDING *URINARY PROBLEMS (pain or burning when urinating, or frequent urination) *BOWEL PROBLEMS (unusual diarrhea, constipation, pain near the anus) TENDERNESS IN MOUTH AND THROAT WITH OR WITHOUT PRESENCE OF ULCERS (sore throat, sores in mouth, or a toothache) UNUSUAL RASH, SWELLING OR PAIN  UNUSUAL VAGINAL DISCHARGE OR ITCHING   Items with * indicate a potential emergency and should be followed up as soon as possible or go to the Emergency Department if any problems should occur.  Please show the CHEMOTHERAPY ALERT CARD or IMMUNOTHERAPY ALERT CARD  at check-in to the Emergency Department and triage nurse.  Should you have questions after your visit or need to cancel or reschedule your appointment, please contact [facility]  Dept: [phone]  and follow the prompts.  Office hours are 8:00 a.m. to 4:30 p.m. Monday - Friday. Please note that voicemails left after 4:00 p.m. may not be returned until the following business day.  We are closed weekends and major holidays. You have access to a nurse at all times for urgent questions. Please call the main number to the clinic Dept: [phone] and follow the prompts.   For any non-urgent questions, you may also contact your provider using MyChart. We now offer e-Visits for anyone 33 and older to request care online for non-urgent symptoms. For details visit [email or web address].   Also download the MyChart app! Go to the app store, search "MyChart", open the app, select [facility], and log in with your MyChart username and password.

## 2023-02-27 NOTE — Progress Notes (Signed)
Patient seen by Dr. Thornton Papas today  Vitals are within treatment parameters:Yes   Labs are within treatment parameters: No (Please specify and give further instructions.)  K+ 3.4 and Mg+ 1.5--give 10 meq KCL and 2 grams Mg+ IV today  Treatment plan has been signed: Yes   Per physician team, Patient is ready for treatment and there are NO modifications to the treatment plan.

## 2023-03-01 LAB — CANCER ANTIGEN 19-9: CA 19-9: 144 U/mL — ABNORMAL HIGH (ref 0–35)

## 2023-03-12 DIAGNOSIS — H43391 Other vitreous opacities, right eye: Secondary | ICD-10-CM | POA: Diagnosis not present

## 2023-03-12 DIAGNOSIS — E119 Type 2 diabetes mellitus without complications: Secondary | ICD-10-CM | POA: Diagnosis not present

## 2023-03-12 DIAGNOSIS — H25013 Cortical age-related cataract, bilateral: Secondary | ICD-10-CM | POA: Diagnosis not present

## 2023-03-12 DIAGNOSIS — H2513 Age-related nuclear cataract, bilateral: Secondary | ICD-10-CM | POA: Diagnosis not present

## 2023-03-12 LAB — HM DIABETES EYE EXAM

## 2023-03-13 ENCOUNTER — Inpatient Hospital Stay: Payer: BC Managed Care – PPO

## 2023-03-13 ENCOUNTER — Other Ambulatory Visit: Payer: Self-pay

## 2023-03-13 ENCOUNTER — Other Ambulatory Visit: Payer: Self-pay | Admitting: Nurse Practitioner

## 2023-03-13 ENCOUNTER — Inpatient Hospital Stay: Payer: BC Managed Care – PPO | Attending: Genetic Counselor

## 2023-03-13 ENCOUNTER — Inpatient Hospital Stay: Payer: BC Managed Care – PPO | Admitting: Nurse Practitioner

## 2023-03-13 ENCOUNTER — Encounter: Payer: Self-pay | Admitting: Nurse Practitioner

## 2023-03-13 VITALS — BP 127/84 | HR 72 | Temp 98.2°F | Resp 18 | Ht 68.0 in | Wt 196.4 lb

## 2023-03-13 DIAGNOSIS — T451X5A Adverse effect of antineoplastic and immunosuppressive drugs, initial encounter: Secondary | ICD-10-CM | POA: Diagnosis not present

## 2023-03-13 DIAGNOSIS — I10 Essential (primary) hypertension: Secondary | ICD-10-CM | POA: Diagnosis not present

## 2023-03-13 DIAGNOSIS — Z7901 Long term (current) use of anticoagulants: Secondary | ICD-10-CM | POA: Insufficient documentation

## 2023-03-13 DIAGNOSIS — G620 Drug-induced polyneuropathy: Secondary | ICD-10-CM | POA: Diagnosis not present

## 2023-03-13 DIAGNOSIS — C250 Malignant neoplasm of head of pancreas: Secondary | ICD-10-CM

## 2023-03-13 DIAGNOSIS — Z905 Acquired absence of kidney: Secondary | ICD-10-CM | POA: Diagnosis not present

## 2023-03-13 DIAGNOSIS — C7801 Secondary malignant neoplasm of right lung: Secondary | ICD-10-CM | POA: Insufficient documentation

## 2023-03-13 DIAGNOSIS — C7802 Secondary malignant neoplasm of left lung: Secondary | ICD-10-CM | POA: Diagnosis not present

## 2023-03-13 DIAGNOSIS — D649 Anemia, unspecified: Secondary | ICD-10-CM | POA: Insufficient documentation

## 2023-03-13 DIAGNOSIS — Z23 Encounter for immunization: Secondary | ICD-10-CM | POA: Insufficient documentation

## 2023-03-13 DIAGNOSIS — Z5111 Encounter for antineoplastic chemotherapy: Secondary | ICD-10-CM | POA: Diagnosis not present

## 2023-03-13 DIAGNOSIS — E119 Type 2 diabetes mellitus without complications: Secondary | ICD-10-CM | POA: Insufficient documentation

## 2023-03-13 DIAGNOSIS — E876 Hypokalemia: Secondary | ICD-10-CM

## 2023-03-13 DIAGNOSIS — Z86718 Personal history of other venous thrombosis and embolism: Secondary | ICD-10-CM | POA: Insufficient documentation

## 2023-03-13 LAB — CBC WITH DIFFERENTIAL (CANCER CENTER ONLY)
Abs Immature Granulocytes: 0.03 10*3/uL (ref 0.00–0.07)
Basophils Absolute: 0 10*3/uL (ref 0.0–0.1)
Basophils Relative: 0 %
Eosinophils Absolute: 0.1 10*3/uL (ref 0.0–0.5)
Eosinophils Relative: 1 %
HCT: 26.3 % — ABNORMAL LOW (ref 36.0–46.0)
Hemoglobin: 8.3 g/dL — ABNORMAL LOW (ref 12.0–15.0)
Immature Granulocytes: 1 %
Lymphocytes Relative: 15 %
Lymphs Abs: 0.9 10*3/uL (ref 0.7–4.0)
MCH: 27.5 pg (ref 26.0–34.0)
MCHC: 31.6 g/dL (ref 30.0–36.0)
MCV: 87.1 fL (ref 80.0–100.0)
Monocytes Absolute: 0.8 10*3/uL (ref 0.1–1.0)
Monocytes Relative: 14 %
Neutro Abs: 3.9 10*3/uL (ref 1.7–7.7)
Neutrophils Relative %: 69 %
Platelet Count: 681 10*3/uL — ABNORMAL HIGH (ref 150–400)
RBC: 3.02 MIL/uL — ABNORMAL LOW (ref 3.87–5.11)
RDW: 15.1 % (ref 11.5–15.5)
WBC Count: 5.6 10*3/uL (ref 4.0–10.5)
nRBC: 0 % (ref 0.0–0.2)

## 2023-03-13 LAB — CMP (CANCER CENTER ONLY)
ALT: 12 U/L (ref 0–44)
AST: 13 U/L — ABNORMAL LOW (ref 15–41)
Albumin: 3.2 g/dL — ABNORMAL LOW (ref 3.5–5.0)
Alkaline Phosphatase: 178 U/L — ABNORMAL HIGH (ref 38–126)
Anion gap: 5 (ref 5–15)
BUN: 9 mg/dL (ref 6–20)
CO2: 24 mmol/L (ref 22–32)
Calcium: 8.3 mg/dL — ABNORMAL LOW (ref 8.9–10.3)
Chloride: 107 mmol/L (ref 98–111)
Creatinine: 1.03 mg/dL — ABNORMAL HIGH (ref 0.44–1.00)
GFR, Estimated: >60 mL/min (ref >60)
Glucose, Bld: 225 mg/dL — ABNORMAL HIGH (ref 70–99)
Potassium: 3.4 mmol/L — ABNORMAL LOW (ref 3.5–5.1)
Sodium: 136 mmol/L (ref 135–145)
Total Bilirubin: 0.3 mg/dL (ref 0.3–1.2)
Total Protein: 7.6 g/dL (ref 6.5–8.1)

## 2023-03-13 LAB — MAGNESIUM: Magnesium: 1.7 mg/dL (ref 1.7–2.4)

## 2023-03-13 MED ORDER — SODIUM CHLORIDE 0.9% FLUSH
10.0000 mL | INTRAVENOUS | Status: DC | PRN
  Administered 2023-03-13: 10 mL

## 2023-03-13 MED ORDER — POTASSIUM CHLORIDE CRYS ER 20 MEQ PO TBCR: 20.0000 meq | EXTENDED_RELEASE_TABLET | Freq: Three times a day (TID) | ORAL | 1 refills | Status: DC

## 2023-03-13 MED ORDER — SODIUM CHLORIDE 0.9 % IV SOLN: Freq: Once | INTRAVENOUS | Status: AC

## 2023-03-13 MED ORDER — PROCHLORPERAZINE MALEATE 10 MG PO TABS
10.0000 mg | ORAL_TABLET | Freq: Once | ORAL | Status: AC
  Administered 2023-03-13: 10 mg via ORAL
  Filled 2023-03-13: qty 1

## 2023-03-13 MED ORDER — HEPARIN SOD (PORK) LOCK FLUSH 100 UNIT/ML IV SOLN
500.0000 [IU] | Freq: Once | INTRAVENOUS | Status: AC | PRN
  Administered 2023-03-13: 500 [IU]

## 2023-03-13 MED ORDER — SODIUM CHLORIDE 0.9 % IV SOLN
2000.0000 mg | Freq: Once | INTRAVENOUS | Status: AC
  Administered 2023-03-13: 2000 mg via INTRAVENOUS
  Filled 2023-03-13: qty 52.6

## 2023-03-13 MED ORDER — PACLITAXEL PROTEIN-BOUND CHEMO INJECTION 100 MG
100.0000 mg/m2 | Freq: Once | INTRAVENOUS | Status: AC
  Administered 2023-03-13: 200 mg via INTRAVENOUS
  Filled 2023-03-13: qty 40

## 2023-03-13 NOTE — Progress Notes (Signed)
Patient seen by Lonna Cobb NP today  Vitals are within treatment parameters:Yes   Labs are within treatment parameters: No (Please specify and give further instructions.) K+ 3.4 no intervention.  Treatment plan has been signed: Yes   Per physician team, Patient is ready for treatment and there are NO modifications to the treatment plan.

## 2023-03-13 NOTE — Progress Notes (Signed)
[facility] OFFICE PROGRESS NOTE   Diagnosis: Pancreas cancer  INTERVAL HISTORY:   Alyssa Ross returns as scheduled.  She completed another cycle of gemcitabine/Abraxane 02/27/2023.  She denies nausea/vomiting.  No mouth sores.  No diarrhea.  Neuropathy symptoms in the hands may be slightly increased.  She mainly notices the symptoms when she is cold.  Symptoms in the feet tend to increase following treatment and then return to baseline.  Objective:  Vital signs in last 24 hours:  Blood pressure 127/84, pulse 72, temperature 98.2 F (36.8 C), temperature source Oral, resp. rate 18, height 5\' 8"  (1.727 m), weight 196 lb 6.4 oz (89.1 kg), last menstrual period 09/17/2012, SpO2 100%.    HEENT: No thrush or ulcers. Resp: Lungs clear bilaterally. Cardio: Regular rate and rhythm. GI: No hepatosplenomegaly. Vascular: No leg edema. Port-A-Cath without erythema.  Lab Results:  Lab Results  Component Value Date   WBC 5.6 03/13/2023   HGB 8.3 (L) 03/13/2023   HCT 26.3 (L) 03/13/2023   MCV 87.1 03/13/2023   PLT 681 (H) 03/13/2023   NEUTROABS 3.9 03/13/2023    Imaging:  No results found.  Medications: I have reviewed the patient's current medications.  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 Tumor invades the duodenal wall and vascular groove, resection margins negative, 4/34 lymph nodes positive MSI-stable, tumor showed instability in 2 loci as did adjacent normal tissue Foundation 1-K-ras G12 V, microsatellite status and tumor mutation burden cannot be determined EUS FNA biopsy of pancreas mass on 07/03/2018-well-differentiated neuroendocrine tumor CTs 01/29/2019-ill-defined pancreas head mass, 5 pulmonary nodules-1 with a small amount of central cavitation, tumor abuts the left margin of the portal vein indistinct density surrounding, hepatic artery, complex cystic lesion of the right kidney, right adrenal  mass-characterized as an adenoma on a [facility] MRI 12/21/2018 Netspot 03/03/2019-no focal pancreas activity, no tracer accumulation within the suspicious pulmonary nodules, left uterine mass with tracer accumulation felt to represent a leiomyoma Elevated preoperative CA 19-9--CA 19-9 186 on 01/18/2019 CT chest 04/16/2019-multiple bilateral pulmonary nodules, some with increased cavitation, stable in size Cycle 1 FOLFIRINOX 04/27/2019 Cycle 2 FOLFIRINOX 05/11/2019 Cycle 3 FOLFIRINOX 05/23/2019 Cycle 4 FOLFIRINOX 06/08/2019 Cycle 5 FOLFIRINOX 06/22/2019 CT chest 07/02/2019-stable size of bilateral pulmonary nodules.  Dominant cavitary lesions in both lungs show increased cavitation with thinner walls.  Stable 2.1 cm right adrenal nodule. 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 No definite evidence of metastatic disease in the abdomen or pelvis. Markedly enlarged and heterogeneous appearing uterus, likely to represent multifocal fibroids. 1 of these lesions appears to be an exophytic subserosal fibroid in the posterior lateral aspect of the uterine body on the left side although this comes in close proximity to the left adnexa such that a primary ovarian lesion is difficult to completely exclude. CTs 02/07/2020-slight enlargement of bilateral lung nodules, some are cavitary, no evidence of metastatic disease in the abdomen or pelvis, stable right adrenal nodule, uterine fibroids CTs 04/26/2020-mild enlargement of pulmonary nodules, slight increase in trace pelvic fluid, new soft tissue thickening  inferior to the cecal tip suspicious for peritoneal metastasis CT 05/26/2020-improved appearance of soft tissue at the inferior  tip of the cecum, mildly thickened short appendix-findings suggestive of resolving appendicitis, stable small bibasilar pulmonary nodules, fibroids Plan biopsy of right cecal tip soft tissue canceled secondary to radiologic improvement CT chest 08/02/2020-enlargement and progressive cavitation of multiple bilateral lung nodules.  Some new nodules are present. CTs 10/24/2020- increase in size of pulmonary nodules, no new nodules, no evidence of metastatic disease in the abdomen, stable right adrenal nodule CT 01/09/2021-slight interval enlargement of pulmonary nodules, stable right adrenal nodule Navigation bronchoscopy 01/30/2021-left lower lobe cavitary nodule FNA-adenocarcinoma, brushing-adenocarcinoma.  Left lower lobe lavage-adenocarcinoma.  Right upper lobe brushing and FNA biopsy of cavitary nodule-adenocarcinoma-immunohistochemical profile consistent with pancreas adenocarcinoma Cycle 1 gemcitabine/Abraxane 03/28/2021 Cycle 2 gemcitabine/Abraxane 04/11/2021 Cycle 3 gemcitabine/Abraxane 04/25/2021 Cycle 4 gemcitabine/Abraxane 05/09/2021 Cycle 5 gemcitabine/Abraxane 05/23/2021 CT chest 06/05/2021-interval cavitation of multiple pulmonary nodules, some nodules have decreased in size, no new or enlarging nodules Cycle 6 gemcitabine/Abraxane 06/06/2021 Cycle 7 gemcitabine/Abraxane 06/21/2021 Cycle 8 gemcitabine/Abraxane 07/05/2021 Cycle 9 Gemcitabine/Abraxane 07/19/2021 Cycle 10 gemcitabine 08/01/2021-Abraxane held secondary to neuropathy CT chest 08/13/2021-mild decrease in size and wall thickness of multiple cavitary nodules, no new or progressive disease in the chest, indeterminate low-attenuation right liver lesions Cycle 11 gemcitabine 08/16/2021-Abraxane held secondary to neuropathy Cycle 12 gemcitabine 08/30/2021-Abraxane held secondary to neuropathy Cycle 13 gemcitabine  09/13/2021-Abraxane held secondary to neuropathy Cycle 14 gemcitabine 09/27/2021-Abraxane held secondary to neuropathy Cycle 15 gemcitabine 10/11/2021-Abraxane held secondary to neuropathy CTs 10/22/2021-no change in multiple cavitary lung nodules, no evidence of disease progression, ill-defined hypodense lesion in the posterior right liver suspicious for metastatic disease Cycle 16 gemcitabine 10/25/2021 Cycle 17 gemcitabine 11/08/2021 Cycle 18 Gemcitabine 11/22/2021 Cycle 19 gemcitabine 12/06/2021 Cycle 20 Gemcitabine 12/20/2021 CT 12/31/2021-mild increase in size of bilateral pulmonary metastases, stable subtle continuation right liver lesions Cycle 20 gemcitabine/Abraxane 01/03/2022 Cycle 21 gemcitabine/Abraxane 01/17/2022 Cycle 22 gemcitabine/Abraxane 01/31/2022 Cycle 23 gemcitabine/Abraxane 02/14/2022 Cycle 24 gemcitabine/Abraxane 02/28/2022 CTs 03/11/2022-widespread metastatic disease to the lungs again noted with slight involution of several of the pulmonary nodules, no definite new nodules noted; interval cavitation of solid lesion in the posterior aspect right lobe of the liver, no new liver lesions noted. Cycle 25 Gemcitabine/Abraxane 03/14/2022 Cycle 26 gemcitabine/Abraxane 03/28/2022 Cycle 27 gemcitabine/Abraxane 04/25/2022 Cycle 28 gemcitabine/Abraxane 05/09/2022 Cycle 29 Gemcitabine 05/23/2022, Abraxane held due to neuropathy CTs 06/04/2022-stable multifocal cavitary pulmonary nodules.  No definite new nodules.  No new nodules greater than a centimeter.  Decreased size of the right posterior hepatic lobe lesion.  Generalized heterogeneity and new focal areas of hypodensity in the liver. Cycle 30 Gemcitabine 06/06/2022, Abraxane held due to neuropathy Cycle 31 gemcitabine 06/20/2022, Abraxane held due to neuropathy Cycle 32 gemcitabine 07/04/2022, Abraxane held due to neuropathy Cycle 33 gemcitabine  07/18/2022 ,Abraxane held due to neuropathy Cycle 34 gemcitabine 08/15/2022, Abraxane held due to  neuropathy Cycle 35 gemcitabine 08/29/2022, Abraxane held due to neuropathy CTs 09/10/2022-enlarging pulmonary metastases.  Subtle poorly defined hypoattenuating lesions in the liver, less well-seen than on 06/04/2022.  Suspected new lesion in the dome of the right hepatic lobe Cycle 36 gemcitabine/Abraxane 09/12/2022 Cycle 37 gemcitabine/Abraxane 09/26/2022 Cycle 38 gemcitabine/Abraxane 10/10/2022 Cycle 39 gemcitabine/Abraxane 11/07/2022 Cycle 40 gemcitabine/Abraxane 11/21/2022 Cycle 41 gemcitabine/Abraxane 12/05/2022 Cycle 42 gemcitabine/Abraxane 12/19/2022 CT is 12/30/2022-new left hepatic lesion, stable bilateral pulmonary metastases Cycle 43 gemcitabine/Abraxane 01/02/2023 CT-guided biopsy of liver lesion 01/27/2023-acute/chronic inflammation, negative for malignancy, culture negative Cycle 44 gemcitabine/Abraxane 01/30/2023 Cycle 45 gemcitabine/Abraxane 02/13/2023 Cycle 46 gemcitabine/Abraxane 02/27/2023 Cycle 47 gemcitabine/Abraxane 03/13/2023   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  Apixaban 10/24/2022  Disposition: Alyssa Ross appears stable.  There is no clinical evidence of disease progression.  Plan to proceed with another cycle of  gemcitabine/Abraxane today.  We will follow-up on the CA 19-9.  Neuropathy symptoms overall stable.  CBC and chemistry panel reviewed.  Labs adequate to proceed as above.  She will return for a follow-up CBC next week, potential blood transfusion.  She will return for follow-up and treatment in 2 weeks.  We are available to see her sooner if needed.    Lonna Cobb ANP/GNP-BC   03/13/2023  9:35 AM

## 2023-03-13 NOTE — Patient Instructions (Signed)
[facility]  Discharge Instructions: Thank you for choosing [facility] to provide your oncology and hematology care.   If you have a lab appointment with the Cancer Center, please go directly to the Cancer Center and check in at the registration area.   Wear comfortable clothing and clothing appropriate for easy access to any Portacath or PICC line.   We strive to give you quality time with your provider. You may need to reschedule your appointment if you arrive late (15 or more minutes).  Arriving late affects you and other patients whose appointments are after yours.  Also, if you miss three or more appointments without notifying the office, you may be dismissed from the clinic at the provider's discretion.      For prescription refill requests, have your pharmacy contact our office and allow 72 hours for refills to be completed.    Today you received the following chemotherapy and/or immunotherapy agents Abraxane and Gemzar       To help prevent nausea and vomiting after your treatment, we encourage you to take your nausea medication as directed.  BELOW ARE SYMPTOMS THAT SHOULD BE REPORTED IMMEDIATELY: *FEVER GREATER THAN 100.4 F (38 C) OR HIGHER *CHILLS OR SWEATING *NAUSEA AND VOMITING THAT IS NOT CONTROLLED WITH YOUR NAUSEA MEDICATION *UNUSUAL SHORTNESS OF BREATH *UNUSUAL BRUISING OR BLEEDING *URINARY PROBLEMS (pain or burning when urinating, or frequent urination) *BOWEL PROBLEMS (unusual diarrhea, constipation, pain near the anus) TENDERNESS IN MOUTH AND THROAT WITH OR WITHOUT PRESENCE OF ULCERS (sore throat, sores in mouth, or a toothache) UNUSUAL RASH, SWELLING OR PAIN  UNUSUAL VAGINAL DISCHARGE OR ITCHING   Items with * indicate a potential emergency and should be followed up as soon as possible or go to the Emergency Department if any problems should occur.  Please show the CHEMOTHERAPY ALERT CARD or IMMUNOTHERAPY ALERT CARD  at check-in to the Emergency Department and triage nurse.  Should you have questions after your visit or need to cancel or reschedule your appointment, please contact [facility]  Dept: [phone]  and follow the prompts.  Office hours are 8:00 a.m. to 4:30 p.m. Monday - Friday. Please note that voicemails left after 4:00 p.m. may not be returned until the following business day.  We are closed weekends and major holidays. You have access to a nurse at all times for urgent questions. Please call the main number to the clinic Dept: [phone] and follow the prompts.   For any non-urgent questions, you may also contact your provider using MyChart. We now offer e-Visits for anyone 33 and older to request care online for non-urgent symptoms. For details visit [email or web address].   Also download the MyChart app! Go to the app store, search "MyChart", open the app, select [facility], and log in with your MyChart username and password.

## 2023-03-14 ENCOUNTER — Other Ambulatory Visit: Payer: BC Managed Care – PPO

## 2023-03-14 ENCOUNTER — Ambulatory Visit: Payer: BC Managed Care – PPO | Admitting: Oncology

## 2023-03-14 ENCOUNTER — Ambulatory Visit: Payer: BC Managed Care – PPO

## 2023-03-14 DIAGNOSIS — E109 Type 1 diabetes mellitus without complications: Secondary | ICD-10-CM | POA: Diagnosis not present

## 2023-03-14 LAB — CANCER ANTIGEN 19-9: CA 19-9: 171 U/mL — ABNORMAL HIGH (ref 0–35)

## 2023-03-20 ENCOUNTER — Other Ambulatory Visit: Payer: Self-pay | Admitting: *Deleted

## 2023-03-20 ENCOUNTER — Other Ambulatory Visit: Payer: Self-pay | Admitting: Oncology

## 2023-03-20 ENCOUNTER — Inpatient Hospital Stay: Payer: BC Managed Care – PPO

## 2023-03-20 DIAGNOSIS — E119 Type 2 diabetes mellitus without complications: Secondary | ICD-10-CM | POA: Diagnosis not present

## 2023-03-20 DIAGNOSIS — G620 Drug-induced polyneuropathy: Secondary | ICD-10-CM | POA: Diagnosis not present

## 2023-03-20 DIAGNOSIS — Z5111 Encounter for antineoplastic chemotherapy: Secondary | ICD-10-CM | POA: Diagnosis not present

## 2023-03-20 DIAGNOSIS — C250 Malignant neoplasm of head of pancreas: Secondary | ICD-10-CM

## 2023-03-20 DIAGNOSIS — D649 Anemia, unspecified: Secondary | ICD-10-CM | POA: Diagnosis not present

## 2023-03-20 DIAGNOSIS — Z86718 Personal history of other venous thrombosis and embolism: Secondary | ICD-10-CM | POA: Diagnosis not present

## 2023-03-20 DIAGNOSIS — C7802 Secondary malignant neoplasm of left lung: Secondary | ICD-10-CM | POA: Diagnosis not present

## 2023-03-20 DIAGNOSIS — Z7901 Long term (current) use of anticoagulants: Secondary | ICD-10-CM | POA: Diagnosis not present

## 2023-03-20 DIAGNOSIS — C7801 Secondary malignant neoplasm of right lung: Secondary | ICD-10-CM | POA: Diagnosis not present

## 2023-03-20 DIAGNOSIS — T451X5A Adverse effect of antineoplastic and immunosuppressive drugs, initial encounter: Secondary | ICD-10-CM | POA: Diagnosis not present

## 2023-03-20 DIAGNOSIS — Z23 Encounter for immunization: Secondary | ICD-10-CM | POA: Diagnosis not present

## 2023-03-20 DIAGNOSIS — I10 Essential (primary) hypertension: Secondary | ICD-10-CM | POA: Diagnosis not present

## 2023-03-20 DIAGNOSIS — Z905 Acquired absence of kidney: Secondary | ICD-10-CM | POA: Diagnosis not present

## 2023-03-20 LAB — CBC WITH DIFFERENTIAL (CANCER CENTER ONLY)
Abs Immature Granulocytes: 0.02 10*3/uL (ref 0.00–0.07)
Basophils Absolute: 0 10*3/uL (ref 0.0–0.1)
Basophils Relative: 1 %
Eosinophils Absolute: 0 10*3/uL (ref 0.0–0.5)
Eosinophils Relative: 0 %
HCT: 26.2 % — ABNORMAL LOW (ref 36.0–46.0)
Hemoglobin: 8.2 g/dL — ABNORMAL LOW (ref 12.0–15.0)
Immature Granulocytes: 0 %
Lymphocytes Relative: 18 %
Lymphs Abs: 0.9 10*3/uL (ref 0.7–4.0)
MCH: 26.9 pg (ref 26.0–34.0)
MCHC: 31.3 g/dL (ref 30.0–36.0)
MCV: 85.9 fL (ref 80.0–100.0)
Monocytes Absolute: 0.4 10*3/uL (ref 0.1–1.0)
Monocytes Relative: 9 %
Neutro Abs: 3.5 10*3/uL (ref 1.7–7.7)
Neutrophils Relative %: 72 %
Platelet Count: 476 10*3/uL — ABNORMAL HIGH (ref 150–400)
RBC: 3.05 MIL/uL — ABNORMAL LOW (ref 3.87–5.11)
RDW: 14.8 % (ref 11.5–15.5)
WBC Count: 4.8 10*3/uL (ref 4.0–10.5)
nRBC: 0 % (ref 0.0–0.2)

## 2023-03-20 LAB — SAMPLE TO BLOOD BANK

## 2023-03-20 LAB — PREPARE RBC (CROSSMATCH)

## 2023-03-20 NOTE — Progress Notes (Signed)
Informed Alyssa Ross of Hgb 8.2 and she feels she needs blood. Is weak, very fatigued and is going on a trip and wants to be able to enjoy. Orders placed and faxed to blood bank and DASH pick up arranged.

## 2023-03-21 ENCOUNTER — Other Ambulatory Visit: Payer: Self-pay | Admitting: Oncology

## 2023-03-21 ENCOUNTER — Other Ambulatory Visit: Payer: Self-pay

## 2023-03-21 ENCOUNTER — Inpatient Hospital Stay: Payer: BC Managed Care – PPO

## 2023-03-21 ENCOUNTER — Other Ambulatory Visit: Payer: Self-pay | Admitting: Nurse Practitioner

## 2023-03-21 DIAGNOSIS — E119 Type 2 diabetes mellitus without complications: Secondary | ICD-10-CM | POA: Diagnosis not present

## 2023-03-21 DIAGNOSIS — C7802 Secondary malignant neoplasm of left lung: Secondary | ICD-10-CM | POA: Diagnosis not present

## 2023-03-21 DIAGNOSIS — C250 Malignant neoplasm of head of pancreas: Secondary | ICD-10-CM

## 2023-03-21 DIAGNOSIS — I82622 Acute embolism and thrombosis of deep veins of left upper extremity: Secondary | ICD-10-CM

## 2023-03-21 DIAGNOSIS — Z23 Encounter for immunization: Secondary | ICD-10-CM | POA: Diagnosis not present

## 2023-03-21 DIAGNOSIS — Z86718 Personal history of other venous thrombosis and embolism: Secondary | ICD-10-CM | POA: Diagnosis not present

## 2023-03-21 DIAGNOSIS — I10 Essential (primary) hypertension: Secondary | ICD-10-CM | POA: Diagnosis not present

## 2023-03-21 DIAGNOSIS — D649 Anemia, unspecified: Secondary | ICD-10-CM | POA: Diagnosis not present

## 2023-03-21 DIAGNOSIS — G620 Drug-induced polyneuropathy: Secondary | ICD-10-CM | POA: Diagnosis not present

## 2023-03-21 DIAGNOSIS — Z7901 Long term (current) use of anticoagulants: Secondary | ICD-10-CM | POA: Diagnosis not present

## 2023-03-21 DIAGNOSIS — C7801 Secondary malignant neoplasm of right lung: Secondary | ICD-10-CM | POA: Diagnosis not present

## 2023-03-21 DIAGNOSIS — E876 Hypokalemia: Secondary | ICD-10-CM

## 2023-03-21 DIAGNOSIS — Z905 Acquired absence of kidney: Secondary | ICD-10-CM | POA: Diagnosis not present

## 2023-03-21 DIAGNOSIS — T451X5A Adverse effect of antineoplastic and immunosuppressive drugs, initial encounter: Secondary | ICD-10-CM | POA: Diagnosis not present

## 2023-03-21 DIAGNOSIS — Z5111 Encounter for antineoplastic chemotherapy: Secondary | ICD-10-CM | POA: Diagnosis not present

## 2023-03-21 MED ORDER — SODIUM CHLORIDE 0.9% IV SOLUTION
250.0000 mL | Freq: Once | INTRAVENOUS | Status: AC
  Administered 2023-03-21: 250 mL via INTRAVENOUS

## 2023-03-21 MED ORDER — HEPARIN SOD (PORK) LOCK FLUSH 100 UNIT/ML IV SOLN
500.0000 [IU] | Freq: Every day | INTRAVENOUS | Status: AC | PRN
  Administered 2023-03-21: 500 [IU]

## 2023-03-21 MED ORDER — SODIUM CHLORIDE 0.9% FLUSH
10.0000 mL | INTRAVENOUS | Status: AC | PRN
  Administered 2023-03-21: 10 mL

## 2023-03-21 NOTE — Patient Instructions (Signed)

## 2023-03-24 LAB — BPAM RBC
Blood Product Expiration Date: 202411062359
Blood Product Expiration Date: 202411062359
ISSUE DATE / TIME: 202410110722
ISSUE DATE / TIME: 202410110722
Unit Type and Rh: 6200
Unit Type and Rh: 6200

## 2023-03-24 LAB — TYPE AND SCREEN
ABO/RH(D): AB POS
Antibody Screen: NEGATIVE
Unit division: 0
Unit division: 0

## 2023-03-27 ENCOUNTER — Inpatient Hospital Stay: Payer: BC Managed Care – PPO

## 2023-03-27 ENCOUNTER — Encounter: Payer: Self-pay | Admitting: *Deleted

## 2023-03-27 ENCOUNTER — Inpatient Hospital Stay: Payer: BC Managed Care – PPO | Admitting: Oncology

## 2023-03-27 VITALS — BP 116/81 | HR 100 | Temp 97.7°F | Resp 18 | Ht 68.0 in | Wt 190.2 lb

## 2023-03-27 DIAGNOSIS — T451X5A Adverse effect of antineoplastic and immunosuppressive drugs, initial encounter: Secondary | ICD-10-CM | POA: Diagnosis not present

## 2023-03-27 DIAGNOSIS — I10 Essential (primary) hypertension: Secondary | ICD-10-CM | POA: Diagnosis not present

## 2023-03-27 DIAGNOSIS — E119 Type 2 diabetes mellitus without complications: Secondary | ICD-10-CM | POA: Diagnosis not present

## 2023-03-27 DIAGNOSIS — D649 Anemia, unspecified: Secondary | ICD-10-CM | POA: Diagnosis not present

## 2023-03-27 DIAGNOSIS — Z23 Encounter for immunization: Secondary | ICD-10-CM | POA: Diagnosis not present

## 2023-03-27 DIAGNOSIS — C250 Malignant neoplasm of head of pancreas: Secondary | ICD-10-CM | POA: Diagnosis not present

## 2023-03-27 DIAGNOSIS — C7801 Secondary malignant neoplasm of right lung: Secondary | ICD-10-CM | POA: Diagnosis not present

## 2023-03-27 DIAGNOSIS — Z905 Acquired absence of kidney: Secondary | ICD-10-CM | POA: Diagnosis not present

## 2023-03-27 DIAGNOSIS — Z86718 Personal history of other venous thrombosis and embolism: Secondary | ICD-10-CM | POA: Diagnosis not present

## 2023-03-27 DIAGNOSIS — Z7901 Long term (current) use of anticoagulants: Secondary | ICD-10-CM | POA: Diagnosis not present

## 2023-03-27 DIAGNOSIS — C7802 Secondary malignant neoplasm of left lung: Secondary | ICD-10-CM | POA: Diagnosis not present

## 2023-03-27 DIAGNOSIS — G620 Drug-induced polyneuropathy: Secondary | ICD-10-CM | POA: Diagnosis not present

## 2023-03-27 DIAGNOSIS — Z5111 Encounter for antineoplastic chemotherapy: Secondary | ICD-10-CM | POA: Diagnosis not present

## 2023-03-27 LAB — CBC WITH DIFFERENTIAL (CANCER CENTER ONLY)
Abs Immature Granulocytes: 0.04 10*3/uL (ref 0.00–0.07)
Basophils Absolute: 0 10*3/uL (ref 0.0–0.1)
Basophils Relative: 1 %
Eosinophils Absolute: 0.1 10*3/uL (ref 0.0–0.5)
Eosinophils Relative: 1 %
HCT: 30.3 % — ABNORMAL LOW (ref 36.0–46.0)
Hemoglobin: 9.9 g/dL — ABNORMAL LOW (ref 12.0–15.0)
Immature Granulocytes: 1 %
Lymphocytes Relative: 10 %
Lymphs Abs: 0.8 10*3/uL (ref 0.7–4.0)
MCH: 27.3 pg (ref 26.0–34.0)
MCHC: 32.7 g/dL (ref 30.0–36.0)
MCV: 83.5 fL (ref 80.0–100.0)
Monocytes Absolute: 1.2 10*3/uL — ABNORMAL HIGH (ref 0.1–1.0)
Monocytes Relative: 15 %
Neutro Abs: 6.1 10*3/uL (ref 1.7–7.7)
Neutrophils Relative %: 72 %
Platelet Count: 467 10*3/uL — ABNORMAL HIGH (ref 150–400)
RBC: 3.63 MIL/uL — ABNORMAL LOW (ref 3.87–5.11)
RDW: 15.1 % (ref 11.5–15.5)
WBC Count: 8.2 10*3/uL (ref 4.0–10.5)
nRBC: 0 % (ref 0.0–0.2)

## 2023-03-27 LAB — CMP (CANCER CENTER ONLY)
ALT: 11 U/L (ref 0–44)
AST: 10 U/L — ABNORMAL LOW (ref 15–41)
Albumin: 3.2 g/dL — ABNORMAL LOW (ref 3.5–5.0)
Alkaline Phosphatase: 223 U/L — ABNORMAL HIGH (ref 38–126)
Anion gap: 7 (ref 5–15)
BUN: 10 mg/dL (ref 6–20)
CO2: 23 mmol/L (ref 22–32)
Calcium: 8.7 mg/dL — ABNORMAL LOW (ref 8.9–10.3)
Chloride: 100 mmol/L (ref 98–111)
Creatinine: 1.2 mg/dL — ABNORMAL HIGH (ref 0.44–1.00)
GFR, Estimated: 53 mL/min — ABNORMAL LOW (ref >60)
Glucose, Bld: 340 mg/dL — ABNORMAL HIGH (ref 70–99)
Potassium: 3.6 mmol/L (ref 3.5–5.1)
Sodium: 130 mmol/L — ABNORMAL LOW (ref 135–145)
Total Bilirubin: 0.6 mg/dL (ref 0.3–1.2)
Total Protein: 8.1 g/dL (ref 6.5–8.1)

## 2023-03-27 LAB — MAGNESIUM: Magnesium: 1.4 mg/dL — ABNORMAL LOW (ref 1.7–2.4)

## 2023-03-27 MED ORDER — HEPARIN SOD (PORK) LOCK FLUSH 100 UNIT/ML IV SOLN
500.0000 [IU] | Freq: Once | INTRAVENOUS | Status: AC | PRN
  Administered 2023-03-27: 500 [IU]

## 2023-03-27 MED ORDER — PACLITAXEL PROTEIN-BOUND CHEMO INJECTION 100 MG
100.0000 mg/m2 | Freq: Once | INTRAVENOUS | Status: AC
  Administered 2023-03-27: 200 mg via INTRAVENOUS
  Filled 2023-03-27: qty 40

## 2023-03-27 MED ORDER — SODIUM CHLORIDE 0.9 % IV SOLN
2000.0000 mg | Freq: Once | INTRAVENOUS | Status: AC
  Administered 2023-03-27: 2000 mg via INTRAVENOUS
  Filled 2023-03-27: qty 52.6

## 2023-03-27 MED ORDER — SODIUM CHLORIDE 0.9 % IV SOLN: Freq: Once | INTRAVENOUS | Status: AC

## 2023-03-27 MED ORDER — INFLUENZA VIRUS VACC SPLIT PF (FLUZONE) 0.5 ML IM SUSY
0.5000 mL | PREFILLED_SYRINGE | INTRAMUSCULAR | Status: AC
  Administered 2023-03-27: 0.5 mL via INTRAMUSCULAR
  Filled 2023-03-27: qty 0.5

## 2023-03-27 MED ORDER — SODIUM CHLORIDE 0.9% FLUSH
10.0000 mL | INTRAVENOUS | Status: DC | PRN
  Administered 2023-03-27: 10 mL

## 2023-03-27 MED ORDER — MAGNESIUM SULFATE 2 GM/50ML IV SOLN
2.0000 g | Freq: Once | INTRAVENOUS | Status: AC
  Administered 2023-03-27: 2 g via INTRAVENOUS
  Filled 2023-03-27: qty 50

## 2023-03-27 MED ORDER — PROCHLORPERAZINE MALEATE 10 MG PO TABS
10.0000 mg | ORAL_TABLET | Freq: Once | ORAL | Status: AC
  Administered 2023-03-27: 10 mg via ORAL
  Filled 2023-03-27: qty 1

## 2023-03-27 NOTE — Progress Notes (Signed)
[facility] OFFICE PROGRESS NOTE   Diagnosis: Pancreas cancer  INTERVAL HISTORY:   Alyssa Ross returns as scheduled.  She completed another treat with gemcitabine/Abraxane 03/13/2023.  She had profound malaise prior to receiving 2 units of packed red blood cells on 03/21/2023.  She now feels better.  No bleeding.  Objective:  Vital signs in last 24 hours:  Blood pressure 116/81, pulse 100, temperature 97.7 F (36.5 C), temperature source Temporal, resp. rate 18, height 5\' 8"  (1.727 m), weight 190 lb 3.2 oz (86.3 kg), last menstrual period 09/17/2012, SpO2 100%.    HEENT: No thrush or ulcers Resp: Lungs clear bilaterally Cardio: Regular rate and rhythm GI: No hepatosplenomegaly, nontender Vascular: No leg edema  Portacath/PICC-without erythema  Lab Results:  Lab Results  Component Value Date   WBC 8.2 03/27/2023   HGB 9.9 (L) 03/27/2023   HCT 30.3 (L) 03/27/2023   MCV 83.5 03/27/2023   PLT 467 (H) 03/27/2023   NEUTROABS 6.1 03/27/2023    CMP  Lab Results  Component Value Date   NA 130 (L) 03/27/2023   K 3.6 03/27/2023   CL 100 03/27/2023   CO2 23 03/27/2023   GLUCOSE 340 (H) 03/27/2023   BUN 10 03/27/2023   CREATININE 1.20 (H) 03/27/2023   CALCIUM 8.7 (L) 03/27/2023   PROT 8.1 03/27/2023   ALBUMIN 3.2 (L) 03/27/2023   AST 10 (L) 03/27/2023   ALT 11 03/27/2023   ALKPHOS 223 (H) 03/27/2023   BILITOT 0.6 03/27/2023   GFRNONAA 53 (L) 03/27/2023   GFRAA >60 02/07/2020    Lab Results  Component Value Date   WUX324 171 (H) 03/13/2023     Medications: I have reviewed the patient's current medications.   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 Tumor invades the duodenal wall and vascular groove, resection margins negative, 4/34 lymph nodes positive MSI-stable, tumor showed instability in 2 loci as did adjacent normal tissue Foundation 1-K-ras G12 V, microsatellite status and tumor  mutation burden cannot be determined EUS FNA biopsy of pancreas mass on 07/03/2018-well-differentiated neuroendocrine tumor CTs 01/29/2019-ill-defined pancreas head mass, 5 pulmonary nodules-1 with a small amount of central cavitation, tumor abuts the left margin of the portal vein indistinct density surrounding, hepatic artery, complex cystic lesion of the right kidney, right adrenal mass-characterized as an adenoma on a [facility] MRI 12/21/2018 Netspot 03/03/2019-no focal pancreas activity, no tracer accumulation within the suspicious pulmonary nodules, left uterine mass with tracer accumulation felt to represent a leiomyoma Elevated preoperative CA 19-9--CA 19-9 186 on 01/18/2019 CT chest 04/16/2019-multiple bilateral pulmonary nodules, some with increased cavitation, stable in size Cycle 1 FOLFIRINOX 04/27/2019 Cycle 2 FOLFIRINOX 05/11/2019 Cycle 3 FOLFIRINOX 05/23/2019 Cycle 4 FOLFIRINOX 06/08/2019 Cycle 5 FOLFIRINOX 06/22/2019 CT chest 07/02/2019-stable size of bilateral pulmonary nodules.  Dominant cavitary lesions in both lungs show increased cavitation with thinner walls.  Stable 2.1 cm right adrenal nodule. 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 No definite evidence of metastatic disease in the abdomen or pelvis. Markedly enlarged and heterogeneous appearing uterus, likely to represent multifocal fibroids. 1 of these lesions appears to be an exophytic subserosal fibroid in the posterior lateral aspect  of the uterine body on the left side although this comes in close proximity to the left  adnexa such that a primary ovarian lesion is difficult to completely exclude. CTs 02/07/2020-slight enlargement of bilateral lung nodules, some are cavitary, no evidence of metastatic disease in the abdomen or pelvis, stable right adrenal nodule, uterine fibroids CTs 04/26/2020-mild enlargement of pulmonary nodules, slight increase in trace pelvic fluid, new soft tissue thickening inferior to the cecal tip suspicious for peritoneal metastasis CT 05/26/2020-improved appearance of soft tissue at the inferior tip of the cecum, mildly thickened short appendix-findings suggestive of resolving appendicitis, stable small bibasilar pulmonary nodules, fibroids Plan biopsy of right cecal tip soft tissue canceled secondary to radiologic improvement CT chest 08/02/2020-enlargement and progressive cavitation of multiple bilateral lung nodules.  Some new nodules are present. CTs 10/24/2020- increase in size of pulmonary nodules, no new nodules, no evidence of metastatic disease in the abdomen, stable right adrenal nodule CT 01/09/2021-slight interval enlargement of pulmonary nodules, stable right adrenal nodule Navigation bronchoscopy 01/30/2021-left lower lobe cavitary nodule FNA-adenocarcinoma, brushing-adenocarcinoma.  Left lower lobe lavage-adenocarcinoma.  Right upper lobe brushing and FNA biopsy of cavitary nodule-adenocarcinoma-immunohistochemical profile consistent with pancreas adenocarcinoma Cycle 1 gemcitabine/Abraxane 03/28/2021 Cycle 2 gemcitabine/Abraxane 04/11/2021 Cycle 3 gemcitabine/Abraxane 04/25/2021 Cycle 4 gemcitabine/Abraxane 05/09/2021 Cycle 5 gemcitabine/Abraxane 05/23/2021 CT chest 06/05/2021-interval cavitation of multiple pulmonary nodules, some nodules have decreased in size, no new or enlarging nodules Cycle 6 gemcitabine/Abraxane 06/06/2021 Cycle 7 gemcitabine/Abraxane 06/21/2021 Cycle 8 gemcitabine/Abraxane 07/05/2021 Cycle 9 Gemcitabine/Abraxane 07/19/2021 Cycle 10 gemcitabine  08/01/2021-Abraxane held secondary to neuropathy CT chest 08/13/2021-mild decrease in size and wall thickness of multiple cavitary nodules, no new or progressive disease in the chest, indeterminate low-attenuation right liver lesions Cycle 11 gemcitabine 08/16/2021-Abraxane held secondary to neuropathy Cycle 12 gemcitabine 08/30/2021-Abraxane held secondary to neuropathy Cycle 13 gemcitabine 09/13/2021-Abraxane held secondary to neuropathy Cycle 14 gemcitabine 09/27/2021-Abraxane held secondary to neuropathy Cycle 15 gemcitabine 10/11/2021-Abraxane held secondary to neuropathy CTs 10/22/2021-no change in multiple cavitary lung nodules, no evidence of disease progression, ill-defined hypodense lesion in the posterior right liver suspicious for metastatic disease Cycle 16 gemcitabine 10/25/2021 Cycle 17 gemcitabine 11/08/2021 Cycle 18 Gemcitabine 11/22/2021 Cycle 19 gemcitabine 12/06/2021 Cycle 20 Gemcitabine 12/20/2021 CT 12/31/2021-mild increase in size of bilateral pulmonary metastases, stable subtle continuation right liver lesions Cycle 20 gemcitabine/Abraxane 01/03/2022 Cycle 21 gemcitabine/Abraxane 01/17/2022 Cycle 22 gemcitabine/Abraxane 01/31/2022 Cycle 23 gemcitabine/Abraxane 02/14/2022 Cycle 24 gemcitabine/Abraxane 02/28/2022 CTs 03/11/2022-widespread metastatic disease to the lungs again noted with slight involution of several of the pulmonary nodules, no definite new nodules noted; interval cavitation of solid lesion in the posterior aspect right lobe of the liver, no new liver lesions noted. Cycle 25 Gemcitabine/Abraxane 03/14/2022 Cycle 26 gemcitabine/Abraxane 03/28/2022 Cycle 27 gemcitabine/Abraxane 04/25/2022 Cycle 28 gemcitabine/Abraxane 05/09/2022 Cycle 29 Gemcitabine 05/23/2022, Abraxane held due to neuropathy CTs 06/04/2022-stable multifocal cavitary pulmonary nodules.  No definite new nodules.  No new nodules greater than a centimeter.  Decreased size of the right posterior hepatic lobe lesion.   Generalized heterogeneity and new focal areas of hypodensity in the liver. Cycle 30 Gemcitabine 06/06/2022, Abraxane held due to neuropathy Cycle 31 gemcitabine 06/20/2022, Abraxane held due to neuropathy Cycle 32 gemcitabine 07/04/2022, Abraxane held due to neuropathy Cycle 33 gemcitabine  07/18/2022 ,Abraxane held due to neuropathy Cycle 34 gemcitabine 08/15/2022, Abraxane held due to neuropathy Cycle 35 gemcitabine 08/29/2022, Abraxane held due to neuropathy CTs 09/10/2022-enlarging pulmonary metastases.  Subtle poorly defined hypoattenuating lesions in the liver, less well-seen than on 06/04/2022.  Suspected new lesion in the  dome of the right hepatic lobe Cycle 36 gemcitabine/Abraxane 09/12/2022 Cycle 37 gemcitabine/Abraxane 09/26/2022 Cycle 38 gemcitabine/Abraxane 10/10/2022 Cycle 39 gemcitabine/Abraxane 11/07/2022 Cycle 40 gemcitabine/Abraxane 11/21/2022 Cycle 41 gemcitabine/Abraxane 12/05/2022 Cycle 42 gemcitabine/Abraxane 12/19/2022 CT is 12/30/2022-new left hepatic lesion, stable bilateral pulmonary metastases Cycle 43 gemcitabine/Abraxane 01/02/2023 CT-guided biopsy of liver lesion 01/27/2023-acute/chronic inflammation, negative for malignancy, culture negative Cycle 44 gemcitabine/Abraxane 01/30/2023 Cycle 45 gemcitabine/Abraxane 02/13/2023 Cycle 46 gemcitabine/Abraxane 02/27/2023 Cycle 47 gemcitabine/Abraxane 03/13/2023 Cycle 48 gemcitabine/Abraxane 03/27/2023   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  Apixaban 10/24/2022    Disposition: Alyssa Ross appears stable.  She continues to tolerate the gemcitabine/Abraxane well.  There is no clinical evidence for progression of the pancreas cancer.  We will follow-up on the CA 19-9 from today.  She will return for an office visit and chemotherapy in 2 weeks.  She will be referred for restaging CTs after the next cycle of chemotherapy.  The anemia is likely secondary to the prolonged course of chemotherapy.  Thornton Papas, MD  03/27/2023  10:03 AM

## 2023-03-27 NOTE — Patient Instructions (Addendum)
[facility]  Discharge Instructions: Thank you for choosing [facility] to provide your oncology and hematology care.   If you have a lab appointment with the Cancer Center, please go directly to the Cancer Center and check in at the registration area.   Wear comfortable clothing and clothing appropriate for easy access to any Portacath or PICC line.   We strive to give you quality time with your provider. You may need to reschedule your appointment if you arrive late (15 or more minutes).  Arriving late affects you and other patients whose appointments are after yours.  Also, if you miss three or more appointments without notifying the office, you may be dismissed from the clinic at the provider's discretion.      For prescription refill requests, have your pharmacy contact our office and allow 72 hours for refills to be completed.    Today you received the following chemotherapy and/or immunotherapy agents Abraxane and Gemzar       To help prevent nausea and vomiting after your treatment, we encourage you to take your nausea medication as directed.  BELOW ARE SYMPTOMS THAT SHOULD BE REPORTED IMMEDIATELY: *FEVER GREATER THAN 100.4 F (38 C) OR HIGHER *CHILLS OR SWEATING *NAUSEA AND VOMITING THAT IS NOT CONTROLLED WITH YOUR NAUSEA MEDICATION *UNUSUAL SHORTNESS OF BREATH *UNUSUAL BRUISING OR BLEEDING *URINARY PROBLEMS (pain or burning when urinating, or frequent urination) *BOWEL PROBLEMS (unusual diarrhea, constipation, pain near the anus) TENDERNESS IN MOUTH AND THROAT WITH OR WITHOUT PRESENCE OF ULCERS (sore throat, sores in mouth, or a toothache) UNUSUAL RASH, SWELLING OR PAIN  UNUSUAL VAGINAL DISCHARGE OR ITCHING   Items with * indicate a potential emergency and should be followed up as soon as possible or go to the Emergency Department if any problems should occur.  Please show the CHEMOTHERAPY ALERT CARD or IMMUNOTHERAPY ALERT CARD  at check-in to the Emergency Department and triage nurse.  Should you have questions after your visit or need to cancel or reschedule your appointment, please contact [facility]  Dept: [phone]  and follow the prompts.  Office hours are 8:00 a.m. to 4:30 p.m. Monday - Friday. Please note that voicemails left after 4:00 p.m. may not be returned until the following business day.  We are closed weekends and major holidays. You have access to a nurse at all times for urgent questions. Please call the main number to the clinic Dept: [phone] and follow the prompts.   For any non-urgent questions, you may also contact your provider using MyChart. We now offer e-Visits for anyone 87 and older to request care online for non-urgent symptoms. For details visit [email or web address].   Also download the MyChart app! Go to the app store, search "MyChart", open the app, select [facility], and log in with your MyChart username and password.

## 2023-03-27 NOTE — Patient Instructions (Signed)

## 2023-03-27 NOTE — Progress Notes (Signed)
Patient seen by Dr. Thornton Papas today  Vitals are within treatment parameters:Yes   Labs are within treatment parameters: No (Please specify and give further instructions.) Mg+ 1.4--OK to proceed  Treatment plan has been signed: Yes   Per physician team, Patient is ready for treatment. Please note the following modifications: Orders placed for 2 grams IV Mg+ today and flu vaccine

## 2023-03-28 LAB — CANCER ANTIGEN 19-9: CA 19-9: 214 U/mL — ABNORMAL HIGH (ref 0–35)

## 2023-04-02 ENCOUNTER — Telehealth: Payer: Self-pay | Admitting: *Deleted

## 2023-04-02 ENCOUNTER — Encounter: Payer: Self-pay | Admitting: Oncology

## 2023-04-02 NOTE — Telephone Encounter (Signed)
Per Dr. Truett Perna: Low Na+ is falsely low due to her high glucose at time of lab. She does not currently feel she needs a transfusion. Agrees to push more water (told to increase her current intake by 3 times). Call early on Friday if she is not feeling better.

## 2023-04-02 NOTE — Telephone Encounter (Signed)
Alyssa Ross reports via MyChart she has been feeling much more fatigued and sluggish after last treatment on 10/17. Denies any dizziness or feeling lightheaded. Glucose readings have been in her normal range. No N/V, but her appetite is much less than usual. No N/V and drinks ~ 2-3 cups liquid/day and knows she should do more. No diarrhea, blood in stool/urine or shortness of breath. She is asking if her Na+ of 130 could be causing this feeling?

## 2023-04-04 ENCOUNTER — Telehealth: Payer: Self-pay

## 2023-04-04 ENCOUNTER — Other Ambulatory Visit: Payer: Self-pay | Admitting: Nurse Practitioner

## 2023-04-04 ENCOUNTER — Encounter: Payer: Self-pay | Admitting: Oncology

## 2023-04-04 ENCOUNTER — Inpatient Hospital Stay (HOSPITAL_BASED_OUTPATIENT_CLINIC_OR_DEPARTMENT_OTHER): Payer: BC Managed Care – PPO | Admitting: Nurse Practitioner

## 2023-04-04 ENCOUNTER — Inpatient Hospital Stay: Payer: BC Managed Care – PPO

## 2023-04-04 ENCOUNTER — Other Ambulatory Visit: Payer: Self-pay | Admitting: *Deleted

## 2023-04-04 ENCOUNTER — Other Ambulatory Visit: Payer: Self-pay

## 2023-04-04 VITALS — BP 99/71 | HR 102 | Temp 98.1°F | Resp 18

## 2023-04-04 DIAGNOSIS — C250 Malignant neoplasm of head of pancreas: Secondary | ICD-10-CM

## 2023-04-04 DIAGNOSIS — Z95828 Presence of other vascular implants and grafts: Secondary | ICD-10-CM

## 2023-04-04 DIAGNOSIS — Z23 Encounter for immunization: Secondary | ICD-10-CM | POA: Diagnosis not present

## 2023-04-04 DIAGNOSIS — E860 Dehydration: Secondary | ICD-10-CM

## 2023-04-04 DIAGNOSIS — Z5111 Encounter for antineoplastic chemotherapy: Secondary | ICD-10-CM | POA: Diagnosis not present

## 2023-04-04 DIAGNOSIS — D649 Anemia, unspecified: Secondary | ICD-10-CM | POA: Diagnosis not present

## 2023-04-04 DIAGNOSIS — G620 Drug-induced polyneuropathy: Secondary | ICD-10-CM | POA: Diagnosis not present

## 2023-04-04 DIAGNOSIS — Z7901 Long term (current) use of anticoagulants: Secondary | ICD-10-CM | POA: Diagnosis not present

## 2023-04-04 DIAGNOSIS — C7801 Secondary malignant neoplasm of right lung: Secondary | ICD-10-CM | POA: Diagnosis not present

## 2023-04-04 DIAGNOSIS — E119 Type 2 diabetes mellitus without complications: Secondary | ICD-10-CM | POA: Diagnosis not present

## 2023-04-04 DIAGNOSIS — I10 Essential (primary) hypertension: Secondary | ICD-10-CM | POA: Diagnosis not present

## 2023-04-04 DIAGNOSIS — Z905 Acquired absence of kidney: Secondary | ICD-10-CM | POA: Diagnosis not present

## 2023-04-04 DIAGNOSIS — Z86718 Personal history of other venous thrombosis and embolism: Secondary | ICD-10-CM | POA: Diagnosis not present

## 2023-04-04 DIAGNOSIS — T451X5A Adverse effect of antineoplastic and immunosuppressive drugs, initial encounter: Secondary | ICD-10-CM | POA: Diagnosis not present

## 2023-04-04 DIAGNOSIS — C7802 Secondary malignant neoplasm of left lung: Secondary | ICD-10-CM | POA: Diagnosis not present

## 2023-04-04 LAB — CMP (CANCER CENTER ONLY)
ALT: 16 U/L (ref 0–44)
AST: 13 U/L — ABNORMAL LOW (ref 15–41)
Albumin: 3.3 g/dL — ABNORMAL LOW (ref 3.5–5.0)
Alkaline Phosphatase: 252 U/L — ABNORMAL HIGH (ref 38–126)
Anion gap: 6 (ref 5–15)
BUN: 14 mg/dL (ref 6–20)
CO2: 23 mmol/L (ref 22–32)
Calcium: 9.5 mg/dL (ref 8.9–10.3)
Chloride: 97 mmol/L — ABNORMAL LOW (ref 98–111)
Creatinine: 1.61 mg/dL — ABNORMAL HIGH (ref 0.44–1.00)
GFR, Estimated: 37 mL/min — ABNORMAL LOW (ref >60)
Glucose, Bld: 296 mg/dL — ABNORMAL HIGH (ref 70–99)
Potassium: 5 mmol/L (ref 3.5–5.1)
Sodium: 126 mmol/L — ABNORMAL LOW (ref 135–145)
Total Bilirubin: 0.6 mg/dL (ref 0.3–1.2)
Total Protein: 8.5 g/dL — ABNORMAL HIGH (ref 6.5–8.1)

## 2023-04-04 LAB — CBC WITH DIFFERENTIAL (CANCER CENTER ONLY)
Abs Immature Granulocytes: 0.05 10*3/uL (ref 0.00–0.07)
Basophils Absolute: 0 10*3/uL (ref 0.0–0.1)
Basophils Relative: 1 %
Eosinophils Absolute: 0 10*3/uL (ref 0.0–0.5)
Eosinophils Relative: 0 %
HCT: 28.8 % — ABNORMAL LOW (ref 36.0–46.0)
Hemoglobin: 9.2 g/dL — ABNORMAL LOW (ref 12.0–15.0)
Immature Granulocytes: 1 %
Lymphocytes Relative: 11 %
Lymphs Abs: 0.8 10*3/uL (ref 0.7–4.0)
MCH: 26.4 pg (ref 26.0–34.0)
MCHC: 31.9 g/dL (ref 30.0–36.0)
MCV: 82.5 fL (ref 80.0–100.0)
Monocytes Absolute: 0.9 10*3/uL (ref 0.1–1.0)
Monocytes Relative: 12 %
Neutro Abs: 5.6 10*3/uL (ref 1.7–7.7)
Neutrophils Relative %: 75 %
Platelet Count: 427 10*3/uL — ABNORMAL HIGH (ref 150–400)
RBC: 3.49 MIL/uL — ABNORMAL LOW (ref 3.87–5.11)
RDW: 15.8 % — ABNORMAL HIGH (ref 11.5–15.5)
WBC Count: 7.4 10*3/uL (ref 4.0–10.5)
nRBC: 0 % (ref 0.0–0.2)

## 2023-04-04 LAB — SAMPLE TO BLOOD BANK

## 2023-04-04 LAB — MAGNESIUM: Magnesium: 1.9 mg/dL (ref 1.7–2.4)

## 2023-04-04 MED ORDER — SODIUM CHLORIDE 0.9% FLUSH
10.0000 mL | Freq: Once | INTRAVENOUS | Status: AC
  Administered 2023-04-04: 10 mL via INTRAVENOUS

## 2023-04-04 MED ORDER — SODIUM CHLORIDE 0.9 % IV SOLN: Freq: Once | INTRAVENOUS | Status: AC

## 2023-04-04 MED ORDER — HEPARIN SOD (PORK) LOCK FLUSH 100 UNIT/ML IV SOLN
500.0000 [IU] | Freq: Once | INTRAVENOUS | Status: AC
  Administered 2023-04-04: 500 [IU] via INTRAVENOUS

## 2023-04-04 NOTE — Progress Notes (Signed)
[facility] OFFICE PROGRESS NOTE   Diagnosis: Pancreas cancer  INTERVAL HISTORY:   Alyssa Ross is seen in an unscheduled visit.  She completed a cycle of gemcitabine/Abraxane 03/27/2023.  She contacted the office to report she was not feeling well and had a low blood pressure, elevated glucose level.  She reports fatigue/malaise since the last chemotherapy.  No nausea.  No diarrhea.  She reports blood sugar readings have been 300 or greater recently despite insulin.  Appetite is poor.  Fluid intake is poor.  She thinks she is losing weight.  No fever.  No urinary symptoms.  Objective:  Vital signs in last 24 hours: Temperature 98.1, heart rate 107, respirations 18, blood pressure 97/73.  HEENT: Mouth appears moist. Resp: Lungs clear bilaterally. Cardio: Regular rate and rhythm. GI: No hepatosplenomegaly. Vascular: No leg edema. Neuro: Alert and oriented. Skin: Decreased skin turgor. Port-A-Cath without erythema.  Lab Results:  Lab Results  Component Value Date   WBC 7.4 04/04/2023   HGB 9.2 (L) 04/04/2023   HCT 28.8 (L) 04/04/2023   MCV 82.5 04/04/2023   PLT 427 (H) 04/04/2023   NEUTROABS 5.6 04/04/2023    Imaging:  No results found.  Medications: I have reviewed the patient's current medications.  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 Tumor invades the duodenal wall and vascular groove, resection margins negative, 4/34 lymph nodes positive MSI-stable, tumor showed instability in 2 loci as did adjacent normal tissue Foundation 1-K-ras G12 V, microsatellite status and tumor mutation burden cannot be determined EUS FNA biopsy of pancreas mass on 07/03/2018-well-differentiated neuroendocrine tumor CTs 01/29/2019-ill-defined pancreas head mass, 5 pulmonary nodules-1 with a small amount of central cavitation, tumor abuts the left margin of the portal vein indistinct density surrounding, hepatic  artery, complex cystic lesion of the right kidney, right adrenal mass-characterized as an adenoma on a [facility] MRI 12/21/2018 [facility] 03/03/2019-no focal pancreas activity, no tracer accumulation within the suspicious pulmonary nodules, left uterine mass with tracer accumulation felt to represent a leiomyoma Elevated preoperative CA 19-9--CA 19-9 186 on 01/18/2019 CT chest 04/16/2019-multiple bilateral pulmonary nodules, some with increased cavitation, stable in size Cycle 1 FOLFIRINOX 04/27/2019 Cycle 2 FOLFIRINOX 05/11/2019 Cycle 3 FOLFIRINOX 05/23/2019 Cycle 4 FOLFIRINOX 06/08/2019 Cycle 5 FOLFIRINOX 06/22/2019 CT chest 07/02/2019-stable size of bilateral pulmonary nodules.  Dominant cavitary lesions in both lungs show increased cavitation with thinner walls.  Stable 2.1 cm right adrenal nodule. 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 No definite evidence of metastatic disease in the abdomen or pelvis. Markedly enlarged and heterogeneous appearing uterus, likely to represent multifocal fibroids. 1 of these lesions appears to be an exophytic subserosal fibroid in the posterior lateral aspect of the uterine body on the left side although this comes in close proximity to the left adnexa such that a primary ovarian lesion is difficult to completely exclude. CTs 02/07/2020-slight enlargement of bilateral lung nodules, some are cavitary, no evidence of metastatic disease in the abdomen or pelvis, stable right adrenal nodule, uterine fibroids CTs 04/26/2020-mild enlargement of pulmonary nodules,  slight increase in trace pelvic fluid, new soft tissue thickening inferior to the cecal tip suspicious for peritoneal metastasis  CT 05/26/2020-improved appearance of soft tissue at the inferior tip of the cecum, mildly thickened short appendix-findings suggestive of resolving appendicitis, stable small bibasilar pulmonary nodules, fibroids Plan biopsy of right cecal tip soft tissue canceled secondary to radiologic improvement CT chest 08/02/2020-enlargement and progressive cavitation of multiple bilateral lung nodules.  Some new nodules are present. CTs 10/24/2020- increase in size of pulmonary nodules, no new nodules, no evidence of metastatic disease in the abdomen, stable right adrenal nodule CT 01/09/2021-slight interval enlargement of pulmonary nodules, stable right adrenal nodule Navigation bronchoscopy 01/30/2021-left lower lobe cavitary nodule FNA-adenocarcinoma, brushing-adenocarcinoma.  Left lower lobe lavage-adenocarcinoma.  Right upper lobe brushing and FNA biopsy of cavitary nodule-adenocarcinoma-immunohistochemical profile consistent with pancreas adenocarcinoma Cycle 1 gemcitabine/Abraxane 03/28/2021 Cycle 2 gemcitabine/Abraxane 04/11/2021 Cycle 3 gemcitabine/Abraxane 04/25/2021 Cycle 4 gemcitabine/Abraxane 05/09/2021 Cycle 5 gemcitabine/Abraxane 05/23/2021 CT chest 06/05/2021-interval cavitation of multiple pulmonary nodules, some nodules have decreased in size, no new or enlarging nodules Cycle 6 gemcitabine/Abraxane 06/06/2021 Cycle 7 gemcitabine/Abraxane 06/21/2021 Cycle 8 gemcitabine/Abraxane 07/05/2021 Cycle 9 Gemcitabine/Abraxane 07/19/2021 Cycle 10 gemcitabine 08/01/2021-Abraxane held secondary to neuropathy CT chest 08/13/2021-mild decrease in size and wall thickness of multiple cavitary nodules, no new or progressive disease in the chest, indeterminate low-attenuation right liver lesions Cycle 11 gemcitabine 08/16/2021-Abraxane held secondary to neuropathy Cycle 12 gemcitabine  08/30/2021-Abraxane held secondary to neuropathy Cycle 13 gemcitabine 09/13/2021-Abraxane held secondary to neuropathy Cycle 14 gemcitabine 09/27/2021-Abraxane held secondary to neuropathy Cycle 15 gemcitabine 10/11/2021-Abraxane held secondary to neuropathy CTs 10/22/2021-no change in multiple cavitary lung nodules, no evidence of disease progression, ill-defined hypodense lesion in the posterior right liver suspicious for metastatic disease Cycle 16 gemcitabine 10/25/2021 Cycle 17 gemcitabine 11/08/2021 Cycle 18 Gemcitabine 11/22/2021 Cycle 19 gemcitabine 12/06/2021 Cycle 20 Gemcitabine 12/20/2021 CT 12/31/2021-mild increase in size of bilateral pulmonary metastases, stable subtle continuation right liver lesions Cycle 20 gemcitabine/Abraxane 01/03/2022 Cycle 21 gemcitabine/Abraxane 01/17/2022 Cycle 22 gemcitabine/Abraxane 01/31/2022 Cycle 23 gemcitabine/Abraxane 02/14/2022 Cycle 24 gemcitabine/Abraxane 02/28/2022 CTs 03/11/2022-widespread metastatic disease to the lungs again noted with slight involution of several of the pulmonary nodules, no definite new nodules noted; interval cavitation of solid lesion in the posterior aspect right lobe of the liver, no new liver lesions noted. Cycle 25 Gemcitabine/Abraxane 03/14/2022 Cycle 26 gemcitabine/Abraxane 03/28/2022 Cycle 27 gemcitabine/Abraxane 04/25/2022 Cycle 28 gemcitabine/Abraxane 05/09/2022 Cycle 29 Gemcitabine 05/23/2022, Abraxane held due to neuropathy CTs 06/04/2022-stable multifocal cavitary pulmonary nodules.  No definite new nodules.  No new nodules greater than a centimeter.  Decreased size of the right posterior hepatic lobe lesion.  Generalized heterogeneity and new focal areas of hypodensity in the liver. Cycle 30 Gemcitabine 06/06/2022, Abraxane held due to neuropathy Cycle 31 gemcitabine 06/20/2022, Abraxane held due to neuropathy Cycle 32 gemcitabine 07/04/2022, Abraxane held due to neuropathy Cycle 33 gemcitabine  07/18/2022 ,Abraxane held due to  neuropathy Cycle 34 gemcitabine 08/15/2022, Abraxane held due to neuropathy Cycle 35 gemcitabine 08/29/2022, Abraxane held due to neuropathy CTs 09/10/2022-enlarging pulmonary metastases.  Subtle poorly defined hypoattenuating lesions in the liver, less well-seen than on 06/04/2022.  Suspected new lesion in the dome of the right hepatic lobe Cycle 36 gemcitabine/Abraxane 09/12/2022 Cycle 37 gemcitabine/Abraxane 09/26/2022 Cycle 38 gemcitabine/Abraxane 10/10/2022 Cycle 39 gemcitabine/Abraxane 11/07/2022 Cycle 40 gemcitabine/Abraxane 11/21/2022 Cycle 41 gemcitabine/Abraxane 12/05/2022 Cycle 42 gemcitabine/Abraxane 12/19/2022 CT is 12/30/2022-new left hepatic lesion, stable bilateral pulmonary metastases Cycle 43 gemcitabine/Abraxane 01/02/2023 CT-guided biopsy of liver lesion 01/27/2023-acute/chronic inflammation, negative for malignancy, culture negative Cycle 44 gemcitabine/Abraxane 01/30/2023 Cycle 45 gemcitabine/Abraxane 02/13/2023 Cycle 46 gemcitabine/Abraxane 02/27/2023 Cycle 47 gemcitabine/Abraxane 03/13/2023 Cycle 48 gemcitabine/Abraxane 03/27/2023  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  Apixaban 10/24/2022      Disposition: Ms.  Ross has metastatic pancreas cancer.  She is on active treatment with gemcitabine/Abraxane.  Her performance status has declined over the past few weeks.  She is having difficulty controlling diabetes despite insulin.  Hemoglobin A1c a few weeks ago was 10.  Blood sugar today is about 300.  Creatinine is elevated.  She appears dehydrated.  We are giving her IV fluids today and tomorrow.  She will increase fluid intake at home.  We are placing blood pressure medications on hold.  We will contact her endocrinologist to request an appointment early next week for assistance with management of the diabetes.  She is scheduled for follow-up 04/10/2023.  We are available to see her sooner if needed.  She was given instructions to go to the emergency department over the weekend if her condition worsens.  Patient seen with Dr. Truett Perna.  Lonna Cobb ANP/GNP-BC   04/04/2023  2:55 PM   This was a shared visit with Lonna Cobb.  Alyssa Ross was interviewed and examined.  She presents with symptoms of dehydration.  We suspect she has dehydration and weight loss secondary to poorly controlled diabetes.  She received intravenous fluids at the Cancer center today and will receive additional fluids tomorrow.  We encouraged her to follow-up with her endocrinologist early next week to work on blood sugar control.  She will be referred for a renal ultrasound if the creatinine remains elevated.  I was present for greater than 50% of today's visit.  I performed medical decision making.  Mancel Bale, MD

## 2023-04-04 NOTE — Patient Instructions (Signed)

## 2023-04-04 NOTE — Telephone Encounter (Signed)
The patient called to follow up regarding her symptoms. She reports that she is still not feeling well. Her blood pressure is 96/56, and her glucose level is 232. There is no fever, but she is experiencing fatigue. Dr. Truett Perna has advised that she may come in for laboratory testing and possible IVF.

## 2023-04-04 NOTE — Progress Notes (Signed)
Patient arrived to infusion area for labs and possible fluids.  Assessment completed and VS checked (see flowsheet).  Lonna Cobb, NP and Dr. Thornton Papas made aware.  Per Lonna Cobb, NP add CMP to lab and ok to start IVF at 222mL/hr.  Lonna Cobb, NP to come evaluate patient in infusion area.  Orthostatic BP retaken at completion of IVF.  Patient stated she did have some improvement in her dizziness upon standing. Lonna Cobb, NP made aware.  Per Lonna Cobb, NP patient patient needs to continue oral hydration, IVF appointment scheduled at [facility] on 04/05/23, lab appointment scheduled for 04/07/23, and ok for patient to be discharged home.  Patient advised to go to ED if she starts to feel worse. Patient informed of Lonna Cobb, NP response and verbalized understanding. VSS upon leaving infusion room.

## 2023-04-04 NOTE — Telephone Encounter (Signed)
I reached out to Dr. Garen Lah office to schedule an appointment for the patient next week for diabetes management. I spoke with Baxter Hire at the center's phone line, and she informed me that the earliest available appointment is in April. I requested that Kristen contact Dr. Jenelle Mages to see if the patient could be seen next week. If Dr. Jenelle Mages is unavailable, the patient can also be seen by a Physician Assistant or Nurse Practitioner. I have forwarded this information to the MD.

## 2023-04-05 ENCOUNTER — Inpatient Hospital Stay: Payer: BC Managed Care – PPO

## 2023-04-05 ENCOUNTER — Other Ambulatory Visit: Payer: Self-pay | Admitting: Oncology

## 2023-04-05 VITALS — BP 124/77 | HR 95 | Temp 97.6°F | Resp 16

## 2023-04-05 DIAGNOSIS — I10 Essential (primary) hypertension: Secondary | ICD-10-CM | POA: Diagnosis not present

## 2023-04-05 DIAGNOSIS — D649 Anemia, unspecified: Secondary | ICD-10-CM | POA: Diagnosis not present

## 2023-04-05 DIAGNOSIS — Z95828 Presence of other vascular implants and grafts: Secondary | ICD-10-CM

## 2023-04-05 DIAGNOSIS — G620 Drug-induced polyneuropathy: Secondary | ICD-10-CM | POA: Diagnosis not present

## 2023-04-05 DIAGNOSIS — E860 Dehydration: Secondary | ICD-10-CM

## 2023-04-05 DIAGNOSIS — C7802 Secondary malignant neoplasm of left lung: Secondary | ICD-10-CM | POA: Diagnosis not present

## 2023-04-05 DIAGNOSIS — Z86718 Personal history of other venous thrombosis and embolism: Secondary | ICD-10-CM | POA: Diagnosis not present

## 2023-04-05 DIAGNOSIS — C250 Malignant neoplasm of head of pancreas: Secondary | ICD-10-CM | POA: Diagnosis not present

## 2023-04-05 DIAGNOSIS — Z23 Encounter for immunization: Secondary | ICD-10-CM | POA: Diagnosis not present

## 2023-04-05 DIAGNOSIS — E119 Type 2 diabetes mellitus without complications: Secondary | ICD-10-CM | POA: Diagnosis not present

## 2023-04-05 DIAGNOSIS — T451X5A Adverse effect of antineoplastic and immunosuppressive drugs, initial encounter: Secondary | ICD-10-CM | POA: Diagnosis not present

## 2023-04-05 DIAGNOSIS — Z905 Acquired absence of kidney: Secondary | ICD-10-CM | POA: Diagnosis not present

## 2023-04-05 DIAGNOSIS — C7801 Secondary malignant neoplasm of right lung: Secondary | ICD-10-CM | POA: Diagnosis not present

## 2023-04-05 DIAGNOSIS — Z5111 Encounter for antineoplastic chemotherapy: Secondary | ICD-10-CM | POA: Diagnosis not present

## 2023-04-05 DIAGNOSIS — Z7901 Long term (current) use of anticoagulants: Secondary | ICD-10-CM | POA: Diagnosis not present

## 2023-04-05 MED ORDER — SODIUM CHLORIDE 0.9 % IV SOLN
INTRAVENOUS | Status: AC
Start: 2023-04-05 — End: 2023-04-05

## 2023-04-05 MED ORDER — HEPARIN SOD (PORK) LOCK FLUSH 100 UNIT/ML IV SOLN
500.0000 [IU] | Freq: Once | INTRAVENOUS | Status: AC
  Administered 2023-04-05: 500 [IU]

## 2023-04-05 MED ORDER — SODIUM CHLORIDE 0.9% FLUSH
10.0000 mL | Freq: Once | INTRAVENOUS | Status: AC
  Administered 2023-04-05: 10 mL

## 2023-04-05 NOTE — Patient Instructions (Signed)

## 2023-04-07 ENCOUNTER — Other Ambulatory Visit: Payer: BC Managed Care – PPO

## 2023-04-07 ENCOUNTER — Telehealth: Payer: Self-pay

## 2023-04-07 DIAGNOSIS — I10 Essential (primary) hypertension: Secondary | ICD-10-CM | POA: Diagnosis not present

## 2023-04-07 DIAGNOSIS — C7802 Secondary malignant neoplasm of left lung: Secondary | ICD-10-CM | POA: Diagnosis not present

## 2023-04-07 DIAGNOSIS — Z86718 Personal history of other venous thrombosis and embolism: Secondary | ICD-10-CM | POA: Diagnosis not present

## 2023-04-07 DIAGNOSIS — G620 Drug-induced polyneuropathy: Secondary | ICD-10-CM | POA: Diagnosis not present

## 2023-04-07 DIAGNOSIS — Z905 Acquired absence of kidney: Secondary | ICD-10-CM | POA: Diagnosis not present

## 2023-04-07 DIAGNOSIS — C250 Malignant neoplasm of head of pancreas: Secondary | ICD-10-CM | POA: Diagnosis not present

## 2023-04-07 DIAGNOSIS — D649 Anemia, unspecified: Secondary | ICD-10-CM | POA: Diagnosis not present

## 2023-04-07 DIAGNOSIS — E119 Type 2 diabetes mellitus without complications: Secondary | ICD-10-CM | POA: Diagnosis not present

## 2023-04-07 DIAGNOSIS — T451X5A Adverse effect of antineoplastic and immunosuppressive drugs, initial encounter: Secondary | ICD-10-CM | POA: Diagnosis not present

## 2023-04-07 DIAGNOSIS — Z7901 Long term (current) use of anticoagulants: Secondary | ICD-10-CM | POA: Diagnosis not present

## 2023-04-07 DIAGNOSIS — Z5111 Encounter for antineoplastic chemotherapy: Secondary | ICD-10-CM | POA: Diagnosis not present

## 2023-04-07 DIAGNOSIS — C7801 Secondary malignant neoplasm of right lung: Secondary | ICD-10-CM | POA: Diagnosis not present

## 2023-04-07 DIAGNOSIS — Z23 Encounter for immunization: Secondary | ICD-10-CM | POA: Diagnosis not present

## 2023-04-07 DIAGNOSIS — E860 Dehydration: Secondary | ICD-10-CM

## 2023-04-07 LAB — CMP (CANCER CENTER ONLY)
ALT: 10 U/L (ref 0–44)
AST: 12 U/L — ABNORMAL LOW (ref 15–41)
Albumin: 3.2 g/dL — ABNORMAL LOW (ref 3.5–5.0)
Alkaline Phosphatase: 231 U/L — ABNORMAL HIGH (ref 38–126)
Anion gap: 5 (ref 5–15)
BUN: 11 mg/dL (ref 6–20)
CO2: 25 mmol/L (ref 22–32)
Calcium: 9 mg/dL (ref 8.9–10.3)
Chloride: 100 mmol/L (ref 98–111)
Creatinine: 1.27 mg/dL — ABNORMAL HIGH (ref 0.44–1.00)
GFR, Estimated: 49 mL/min — ABNORMAL LOW (ref >60)
Glucose, Bld: 187 mg/dL — ABNORMAL HIGH (ref 70–99)
Potassium: 4 mmol/L (ref 3.5–5.1)
Sodium: 130 mmol/L — ABNORMAL LOW (ref 135–145)
Total Bilirubin: 0.5 mg/dL (ref 0.3–1.2)
Total Protein: 7.9 g/dL (ref 6.5–8.1)

## 2023-04-07 NOTE — Telephone Encounter (Signed)
I reached out to Dr. Garen Lah office and spoke with [facility] regarding the possibility of scheduling an earlier appointment for the patient to address her diabetes management. She indicated that she had communicated this message to the nurse earlier.

## 2023-04-10 ENCOUNTER — Other Ambulatory Visit: Payer: Self-pay

## 2023-04-10 ENCOUNTER — Telehealth: Payer: Self-pay

## 2023-04-10 ENCOUNTER — Other Ambulatory Visit: Payer: Self-pay | Admitting: *Deleted

## 2023-04-10 ENCOUNTER — Encounter: Payer: Self-pay | Admitting: *Deleted

## 2023-04-10 ENCOUNTER — Inpatient Hospital Stay: Payer: BC Managed Care – PPO

## 2023-04-10 ENCOUNTER — Inpatient Hospital Stay: Payer: BC Managed Care – PPO | Admitting: Oncology

## 2023-04-10 VITALS — BP 109/74 | HR 89 | Temp 97.6°F | Resp 18

## 2023-04-10 VITALS — BP 88/69 | HR 102 | Temp 98.1°F | Resp 18 | Ht 68.0 in | Wt 185.3 lb

## 2023-04-10 DIAGNOSIS — E119 Type 2 diabetes mellitus without complications: Secondary | ICD-10-CM | POA: Diagnosis not present

## 2023-04-10 DIAGNOSIS — G620 Drug-induced polyneuropathy: Secondary | ICD-10-CM | POA: Diagnosis not present

## 2023-04-10 DIAGNOSIS — Z7901 Long term (current) use of anticoagulants: Secondary | ICD-10-CM | POA: Diagnosis not present

## 2023-04-10 DIAGNOSIS — C250 Malignant neoplasm of head of pancreas: Secondary | ICD-10-CM

## 2023-04-10 DIAGNOSIS — I10 Essential (primary) hypertension: Secondary | ICD-10-CM | POA: Diagnosis not present

## 2023-04-10 DIAGNOSIS — E860 Dehydration: Secondary | ICD-10-CM

## 2023-04-10 DIAGNOSIS — D649 Anemia, unspecified: Secondary | ICD-10-CM

## 2023-04-10 DIAGNOSIS — C7801 Secondary malignant neoplasm of right lung: Secondary | ICD-10-CM | POA: Diagnosis not present

## 2023-04-10 DIAGNOSIS — C7802 Secondary malignant neoplasm of left lung: Secondary | ICD-10-CM | POA: Diagnosis not present

## 2023-04-10 DIAGNOSIS — Z5111 Encounter for antineoplastic chemotherapy: Secondary | ICD-10-CM | POA: Diagnosis not present

## 2023-04-10 DIAGNOSIS — T451X5A Adverse effect of antineoplastic and immunosuppressive drugs, initial encounter: Secondary | ICD-10-CM | POA: Diagnosis not present

## 2023-04-10 DIAGNOSIS — Z23 Encounter for immunization: Secondary | ICD-10-CM | POA: Diagnosis not present

## 2023-04-10 DIAGNOSIS — Z905 Acquired absence of kidney: Secondary | ICD-10-CM | POA: Diagnosis not present

## 2023-04-10 DIAGNOSIS — Z86718 Personal history of other venous thrombosis and embolism: Secondary | ICD-10-CM | POA: Diagnosis not present

## 2023-04-10 LAB — CBC WITH DIFFERENTIAL (CANCER CENTER ONLY)
Abs Immature Granulocytes: 0.04 10*3/uL (ref 0.00–0.07)
Basophils Absolute: 0 10*3/uL (ref 0.0–0.1)
Basophils Relative: 0 %
Eosinophils Absolute: 0 10*3/uL (ref 0.0–0.5)
Eosinophils Relative: 0 %
HCT: 24.6 % — ABNORMAL LOW (ref 36.0–46.0)
Hemoglobin: 8 g/dL — ABNORMAL LOW (ref 12.0–15.0)
Immature Granulocytes: 1 %
Lymphocytes Relative: 14 %
Lymphs Abs: 1.1 10*3/uL (ref 0.7–4.0)
MCH: 26.6 pg (ref 26.0–34.0)
MCHC: 32.5 g/dL (ref 30.0–36.0)
MCV: 81.7 fL (ref 80.0–100.0)
Monocytes Absolute: 1.8 10*3/uL — ABNORMAL HIGH (ref 0.1–1.0)
Monocytes Relative: 25 %
Neutro Abs: 4.4 10*3/uL (ref 1.7–7.7)
Neutrophils Relative %: 60 %
Platelet Count: 633 10*3/uL — ABNORMAL HIGH (ref 150–400)
RBC: 3.01 MIL/uL — ABNORMAL LOW (ref 3.87–5.11)
RDW: 16 % — ABNORMAL HIGH (ref 11.5–15.5)
WBC Count: 7.4 10*3/uL (ref 4.0–10.5)
nRBC: 0 % (ref 0.0–0.2)

## 2023-04-10 LAB — CMP (CANCER CENTER ONLY)
ALT: 9 U/L (ref 0–44)
AST: 12 U/L — ABNORMAL LOW (ref 15–41)
Albumin: 3 g/dL — ABNORMAL LOW (ref 3.5–5.0)
Alkaline Phosphatase: 214 U/L — ABNORMAL HIGH (ref 38–126)
Anion gap: 5 (ref 5–15)
BUN: 9 mg/dL (ref 6–20)
CO2: 27 mmol/L (ref 22–32)
Calcium: 8.6 mg/dL — ABNORMAL LOW (ref 8.9–10.3)
Chloride: 101 mmol/L (ref 98–111)
Creatinine: 1.06 mg/dL — ABNORMAL HIGH (ref 0.44–1.00)
GFR, Estimated: >60 mL/min (ref >60)
Glucose, Bld: 161 mg/dL — ABNORMAL HIGH (ref 70–99)
Potassium: 2.8 mmol/L — ABNORMAL LOW (ref 3.5–5.1)
Sodium: 133 mmol/L — ABNORMAL LOW (ref 135–145)
Total Bilirubin: 0.7 mg/dL (ref 0.3–1.2)
Total Protein: 7.5 g/dL (ref 6.5–8.1)

## 2023-04-10 LAB — PREPARE RBC (CROSSMATCH)

## 2023-04-10 LAB — FERRITIN: Ferritin: 525 ng/mL — ABNORMAL HIGH (ref 11–307)

## 2023-04-10 LAB — MAGNESIUM: Magnesium: 1.5 mg/dL — ABNORMAL LOW (ref 1.7–2.4)

## 2023-04-10 MED ORDER — SODIUM CHLORIDE 0.9% FLUSH
10.0000 mL | INTRAVENOUS | Status: AC | PRN
  Administered 2023-04-10: 10 mL

## 2023-04-10 MED ORDER — HEPARIN SOD (PORK) LOCK FLUSH 100 UNIT/ML IV SOLN
500.0000 [IU] | Freq: Every day | INTRAVENOUS | Status: AC | PRN
  Administered 2023-04-10: 500 [IU]

## 2023-04-10 MED ORDER — SODIUM CHLORIDE 0.9 % IV SOLN
INTRAVENOUS | Status: AC
Start: 2023-04-10 — End: 2023-04-10

## 2023-04-10 MED ORDER — SODIUM CHLORIDE 0.9% IV SOLUTION
250.0000 mL | INTRAVENOUS | Status: DC
  Administered 2023-04-10: 100 mL via INTRAVENOUS

## 2023-04-10 NOTE — Progress Notes (Signed)
Will transfuse 1 unit blood today and 1 unit on 11/01 for Hgb 8.0 and symptomatic. Orders placed and DASH will be called.

## 2023-04-10 NOTE — Patient Instructions (Signed)

## 2023-04-10 NOTE — Telephone Encounter (Signed)
-----   Message from Lonna Cobb sent at 04/10/2023  4:47 PM EDT ----- Her potassium level is low.  Please ask her if she is taking potassium as noted on her medication list.  She may need IV potassium when she is here for blood 04/11/2023.

## 2023-04-10 NOTE — Progress Notes (Signed)
Patient seen by Dr. Thornton Papas today  Vitals are within treatment parameters:No (Please specify and give further instructions.)Hypotensive and orthostatic and tachy  Labs are within treatment parameters: No (Please specify and give further instructions.)   Treatment plan has been signed: No   Per physician team, Patient will not be receiving treatment today. She will need 1 liter NS today and then recheck orthostatic vs. MD requesting 1 unit blood today and 1 unit on 11/01 for Hgb 8.0

## 2023-04-10 NOTE — Telephone Encounter (Signed)
The patient reported that she resumed taking her potassium supplements and declined potassium on April 11, 2023.

## 2023-04-10 NOTE — Progress Notes (Signed)
[facility] OFFICE PROGRESS NOTE   Diagnosis: Pancreas cancer  INTERVAL HISTORY:   Alyssa Ross returns as scheduled.  She received intravenous fluids 04/04/2023 and reports mild improvement in malaise.  She continues to have significant malaise.  She reports exertional dyspnea.  Neuropathy symptoms have progressed in the hands.  This does not interfere with activity.  Her blood sugar has been lower.  Objective:  Vital signs in last 24 hours:  Blood pressure (!) 88/69, pulse (!) 102, temperature 98.1 F (36.7 C), temperature source Temporal, resp. rate 18, height 5\' 8"  (1.727 m), weight 185 lb 4.8 oz (84.1 kg), last menstrual period 09/17/2012, SpO2 100%.    HEENT: No thrush or ulcers Resp: Lungs clear bilaterally Cardio: Regular rate and rhythm GI: No hepatosplenomegaly, nontender Vascular: No leg edema  Skin: Mild hyperpigmentation of the hands  Portacath/PICC-without erythema  Lab Results:  Lab Results  Component Value Date   WBC 7.4 04/10/2023   HGB 8.0 (L) 04/10/2023   HCT 24.6 (L) 04/10/2023   MCV 81.7 04/10/2023   PLT 633 (H) 04/10/2023   NEUTROABS 4.4 04/10/2023    CMP  Lab Results  Component Value Date   NA 130 (L) 04/07/2023   K 4.0 04/07/2023   CL 100 04/07/2023   CO2 25 04/07/2023   GLUCOSE 187 (H) 04/07/2023   BUN 11 04/07/2023   CREATININE 1.27 (H) 04/07/2023   CALCIUM 9.0 04/07/2023   PROT 7.9 04/07/2023   ALBUMIN 3.2 (L) 04/07/2023   AST 12 (L) 04/07/2023   ALT 10 04/07/2023   ALKPHOS 231 (H) 04/07/2023   BILITOT 0.5 04/07/2023   GFRNONAA 49 (L) 04/07/2023   GFRAA >60 02/07/2020    Lab Results  Component Value Date   [id number] 214 (H) 03/27/2023    Medications: I have reviewed the patient's current medications.   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 Tumor invades the duodenal wall and vascular groove, resection margins negative, 4/34 lymph nodes  positive MSI-stable, tumor showed instability in 2 loci as did adjacent normal tissue Foundation 1-K-ras G12 V, microsatellite status and tumor mutation burden cannot be determined EUS FNA biopsy of pancreas mass on 07/03/2018-well-differentiated neuroendocrine tumor CTs 01/29/2019-ill-defined pancreas head mass, 5 pulmonary nodules-1 with a small amount of central cavitation, tumor abuts the left margin of the portal vein indistinct density surrounding, hepatic artery, complex cystic lesion of the right kidney, right adrenal mass-characterized as an adenoma on a [facility] MRI 12/21/2018 Netspot 03/03/2019-no focal pancreas activity, no tracer accumulation within the suspicious pulmonary nodules, left uterine mass with tracer accumulation felt to represent a leiomyoma Elevated preoperative CA 19-9--CA 19-9 186 on 01/18/2019 CT chest 04/16/2019-multiple bilateral pulmonary nodules, some with increased cavitation, stable in size Cycle 1 FOLFIRINOX 04/27/2019 Cycle 2 FOLFIRINOX 05/11/2019 Cycle 3 FOLFIRINOX 05/23/2019 Cycle 4 FOLFIRINOX 06/08/2019 Cycle 5 FOLFIRINOX 06/22/2019 CT chest 07/02/2019-stable size of bilateral pulmonary nodules.  Dominant cavitary lesions in both lungs show increased cavitation with thinner walls.  Stable 2.1 cm right adrenal nodule. 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 No definite evidence of metastatic disease in the abdomen or pelvis. Markedly enlarged and heterogeneous appearing uterus,  likely to represent multifocal fibroids. 1 of these lesions appears to be an  exophytic subserosal fibroid in the posterior lateral aspect of the uterine body on the left side although this comes in close proximity to the left adnexa such that a primary ovarian lesion is difficult to completely exclude. CTs 02/07/2020-slight enlargement of bilateral lung nodules, some are cavitary, no evidence of metastatic disease in the abdomen or pelvis, stable right adrenal nodule, uterine fibroids CTs 04/26/2020-mild enlargement of pulmonary nodules, slight increase in trace pelvic fluid, new soft tissue thickening inferior to the cecal tip suspicious for peritoneal metastasis CT 05/26/2020-improved appearance of soft tissue at the inferior tip of the cecum, mildly thickened short appendix-findings suggestive of resolving appendicitis, stable small bibasilar pulmonary nodules, fibroids Plan biopsy of right cecal tip soft tissue canceled secondary to radiologic improvement CT chest 08/02/2020-enlargement and progressive cavitation of multiple bilateral lung nodules.  Some new nodules are present. CTs 10/24/2020- increase in size of pulmonary nodules, no new nodules, no evidence of metastatic disease in the abdomen, stable right adrenal nodule CT 01/09/2021-slight interval enlargement of pulmonary nodules, stable right adrenal nodule Navigation bronchoscopy 01/30/2021-left lower lobe cavitary nodule FNA-adenocarcinoma, brushing-adenocarcinoma.  Left lower lobe lavage-adenocarcinoma.  Right upper lobe brushing and FNA biopsy of cavitary nodule-adenocarcinoma-immunohistochemical profile consistent with pancreas adenocarcinoma Cycle 1 gemcitabine/Abraxane 03/28/2021 Cycle 2 gemcitabine/Abraxane 04/11/2021 Cycle 3 gemcitabine/Abraxane 04/25/2021 Cycle 4 gemcitabine/Abraxane 05/09/2021 Cycle 5 gemcitabine/Abraxane 05/23/2021 CT chest 06/05/2021-interval cavitation of multiple pulmonary nodules, some nodules have decreased in size, no new or enlarging nodules Cycle 6 gemcitabine/Abraxane  06/06/2021 Cycle 7 gemcitabine/Abraxane 06/21/2021 Cycle 8 gemcitabine/Abraxane 07/05/2021 Cycle 9 Gemcitabine/Abraxane 07/19/2021 Cycle 10 gemcitabine 08/01/2021-Abraxane held secondary to neuropathy CT chest 08/13/2021-mild decrease in size and wall thickness of multiple cavitary nodules, no new or progressive disease in the chest, indeterminate low-attenuation right liver lesions Cycle 11 gemcitabine 08/16/2021-Abraxane held secondary to neuropathy Cycle 12 gemcitabine 08/30/2021-Abraxane held secondary to neuropathy Cycle 13 gemcitabine 09/13/2021-Abraxane held secondary to neuropathy Cycle 14 gemcitabine 09/27/2021-Abraxane held secondary to neuropathy Cycle 15 gemcitabine 10/11/2021-Abraxane held secondary to neuropathy CTs 10/22/2021-no change in multiple cavitary lung nodules, no evidence of disease progression, ill-defined hypodense lesion in the posterior right liver suspicious for metastatic disease Cycle 16 gemcitabine 10/25/2021 Cycle 17 gemcitabine 11/08/2021 Cycle 18 Gemcitabine 11/22/2021 Cycle 19 gemcitabine 12/06/2021 Cycle 20 Gemcitabine 12/20/2021 CT 12/31/2021-mild increase in size of bilateral pulmonary metastases, stable subtle continuation right liver lesions Cycle 20 gemcitabine/Abraxane 01/03/2022 Cycle 21 gemcitabine/Abraxane 01/17/2022 Cycle 22 gemcitabine/Abraxane 01/31/2022 Cycle 23 gemcitabine/Abraxane 02/14/2022 Cycle 24 gemcitabine/Abraxane 02/28/2022 CTs 03/11/2022-widespread metastatic disease to the lungs again noted with slight involution of several of the pulmonary nodules, no definite new nodules noted; interval cavitation of solid lesion in the posterior aspect right lobe of the liver, no new liver lesions noted. Cycle 25 Gemcitabine/Abraxane 03/14/2022 Cycle 26 gemcitabine/Abraxane 03/28/2022 Cycle 27 gemcitabine/Abraxane 04/25/2022 Cycle 28 gemcitabine/Abraxane 05/09/2022 Cycle 29 Gemcitabine 05/23/2022, Abraxane held due to neuropathy CTs 06/04/2022-stable multifocal cavitary  pulmonary nodules.  No definite new nodules.  No new nodules greater than a centimeter.  Decreased size of the right posterior hepatic lobe lesion.  Generalized heterogeneity and new focal areas of hypodensity in the liver. Cycle 30 Gemcitabine 06/06/2022, Abraxane held due to neuropathy Cycle 31 gemcitabine 06/20/2022, Abraxane held due to neuropathy Cycle 32 gemcitabine 07/04/2022, Abraxane held due to neuropathy Cycle 33 gemcitabine  07/18/2022 ,Abraxane held due to neuropathy Cycle 34 gemcitabine 08/15/2022, Abraxane held due to neuropathy Cycle 35 gemcitabine 08/29/2022, Abraxane held due to neuropathy CTs 09/10/2022-enlarging pulmonary  metastases.  Subtle poorly defined hypoattenuating lesions in the liver, less well-seen than on 06/04/2022.  Suspected new lesion in the dome of the right hepatic lobe Cycle 36 gemcitabine/Abraxane 09/12/2022 Cycle 37 gemcitabine/Abraxane 09/26/2022 Cycle 38 gemcitabine/Abraxane 10/10/2022 Cycle 39 gemcitabine/Abraxane 11/07/2022 Cycle 40 gemcitabine/Abraxane 11/21/2022 Cycle 41 gemcitabine/Abraxane 12/05/2022 Cycle 42 gemcitabine/Abraxane 12/19/2022 CT is 12/30/2022-new left hepatic lesion, stable bilateral pulmonary metastases Cycle 43 gemcitabine/Abraxane 01/02/2023 CT-guided biopsy of liver lesion 01/27/2023-acute/chronic inflammation, negative for malignancy, culture negative Cycle 44 gemcitabine/Abraxane 01/30/2023 Cycle 45 gemcitabine/Abraxane 02/13/2023 Cycle 46 gemcitabine/Abraxane 02/27/2023 Cycle 47 gemcitabine/Abraxane 03/13/2023 Cycle 48 gemcitabine/Abraxane 03/27/2023   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  Apixaban 10/24/2022       Disposition: Alyssa Ross has metastatic pancreas cancer.  Her clinical status has declined over the past several weeks.  She has severe anemia.  The anemia may be related to chronic disease and chemotherapy.  The MCV is low and the platelet count is elevated.  We will check a ferritin level.  She will be transfused with 2 units of packed red blood cells.  She has orthostasis today.  We will give IV fluids and repeat orthostatic vital signs.  Chemotherapy will be held today.  She will undergo a restaging CT evaluation prior to an office visit in 2 weeks.  Thornton Papas, MD  04/10/2023  9:30 AM

## 2023-04-11 ENCOUNTER — Inpatient Hospital Stay: Payer: BC Managed Care – PPO | Attending: Genetic Counselor

## 2023-04-11 DIAGNOSIS — Z7901 Long term (current) use of anticoagulants: Secondary | ICD-10-CM | POA: Diagnosis not present

## 2023-04-11 DIAGNOSIS — Z905 Acquired absence of kidney: Secondary | ICD-10-CM | POA: Diagnosis not present

## 2023-04-11 DIAGNOSIS — K769 Liver disease, unspecified: Secondary | ICD-10-CM | POA: Diagnosis not present

## 2023-04-11 DIAGNOSIS — R1030 Lower abdominal pain, unspecified: Secondary | ICD-10-CM | POA: Insufficient documentation

## 2023-04-11 DIAGNOSIS — N6320 Unspecified lump in the left breast, unspecified quadrant: Secondary | ICD-10-CM | POA: Diagnosis not present

## 2023-04-11 DIAGNOSIS — D649 Anemia, unspecified: Secondary | ICD-10-CM | POA: Diagnosis not present

## 2023-04-11 DIAGNOSIS — Z85528 Personal history of other malignant neoplasm of kidney: Secondary | ICD-10-CM | POA: Diagnosis not present

## 2023-04-11 DIAGNOSIS — G629 Polyneuropathy, unspecified: Secondary | ICD-10-CM | POA: Insufficient documentation

## 2023-04-11 DIAGNOSIS — Z9049 Acquired absence of other specified parts of digestive tract: Secondary | ICD-10-CM | POA: Diagnosis not present

## 2023-04-11 DIAGNOSIS — C7802 Secondary malignant neoplasm of left lung: Secondary | ICD-10-CM | POA: Diagnosis not present

## 2023-04-11 DIAGNOSIS — C7801 Secondary malignant neoplasm of right lung: Secondary | ICD-10-CM | POA: Diagnosis not present

## 2023-04-11 DIAGNOSIS — Z79899 Other long term (current) drug therapy: Secondary | ICD-10-CM | POA: Insufficient documentation

## 2023-04-11 DIAGNOSIS — Z86718 Personal history of other venous thrombosis and embolism: Secondary | ICD-10-CM | POA: Diagnosis not present

## 2023-04-11 DIAGNOSIS — I10 Essential (primary) hypertension: Secondary | ICD-10-CM | POA: Diagnosis not present

## 2023-04-11 DIAGNOSIS — C250 Malignant neoplasm of head of pancreas: Secondary | ICD-10-CM | POA: Insufficient documentation

## 2023-04-11 DIAGNOSIS — E119 Type 2 diabetes mellitus without complications: Secondary | ICD-10-CM | POA: Insufficient documentation

## 2023-04-11 LAB — CANCER ANTIGEN 19-9: CA 19-9: 195 U/mL — ABNORMAL HIGH (ref 0–35)

## 2023-04-11 MED ORDER — HEPARIN SOD (PORK) LOCK FLUSH 100 UNIT/ML IV SOLN
500.0000 [IU] | Freq: Every day | INTRAVENOUS | Status: AC | PRN
  Administered 2023-04-11: 500 [IU]

## 2023-04-11 MED ORDER — SODIUM CHLORIDE 0.9% IV SOLUTION
250.0000 mL | INTRAVENOUS | Status: DC
  Administered 2023-04-11: 100 mL via INTRAVENOUS

## 2023-04-11 MED ORDER — SODIUM CHLORIDE 0.9% FLUSH
10.0000 mL | INTRAVENOUS | Status: AC | PRN
  Administered 2023-04-11: 10 mL

## 2023-04-11 NOTE — Patient Instructions (Signed)

## 2023-04-12 LAB — BPAM RBC
Blood Product Expiration Date: 202411272359
Blood Product Expiration Date: 202411272359
ISSUE DATE / TIME: 202410311222
ISSUE DATE / TIME: 202411011109
Unit Type and Rh: 6200
Unit Type and Rh: 6200

## 2023-04-12 LAB — TYPE AND SCREEN
ABO/RH(D): AB POS
Antibody Screen: NEGATIVE
Unit division: 0
Unit division: 0

## 2023-04-14 ENCOUNTER — Ambulatory Visit: Payer: BC Managed Care – PPO | Admitting: Dermatology

## 2023-04-14 ENCOUNTER — Encounter: Payer: Self-pay | Admitting: Oncology

## 2023-04-14 ENCOUNTER — Telehealth: Payer: Self-pay | Admitting: *Deleted

## 2023-04-14 NOTE — Telephone Encounter (Signed)
Alyssa Ross reports some vaginal spotting yesterday, more than her usual occasional spotting. Today only sees small amount when she wipes. Dr. Truett Perna made aware and is not concerned at this time.  Provided her with CT scan appointment when to arrive. She states they can use her arm instead of port.

## 2023-04-15 ENCOUNTER — Ambulatory Visit: Payer: BC Managed Care – PPO

## 2023-04-15 ENCOUNTER — Encounter: Payer: Self-pay | Admitting: Internal Medicine

## 2023-04-15 VITALS — BP 102/60 | HR 96 | Temp 98.5°F | Ht 68.0 in | Wt 185.0 lb

## 2023-04-15 DIAGNOSIS — I10 Essential (primary) hypertension: Secondary | ICD-10-CM

## 2023-04-15 NOTE — Progress Notes (Signed)
Patient presents today for a BP check, patient reports her bp was low at her oncologist. Patient was told to stop her Valsartan 160mg  and spironolactone 25mg . Patient reports her bp has been dropping when standing but she is no longer having dizziness. Orthostatic Vitals for the past 48 hrs (Last 6 readings):  Orthostatic BP Orthostatic Pulse  04/15/23 1455 102/60 96  04/15/23 1458 90/60 112  04/15/23 1500 102/62 96   Per provider- keep medications stopped -continue to monitor f/u next appt.

## 2023-04-18 DIAGNOSIS — E109 Type 1 diabetes mellitus without complications: Secondary | ICD-10-CM | POA: Diagnosis not present

## 2023-04-21 ENCOUNTER — Ambulatory Visit (HOSPITAL_BASED_OUTPATIENT_CLINIC_OR_DEPARTMENT_OTHER)
Admission: RE | Admit: 2023-04-21 | Discharge: 2023-04-21 | Disposition: A | Discharge status: Home / Self Care | Payer: BC Managed Care – PPO | Source: Ambulatory Visit | Attending: Oncology | Admitting: Oncology

## 2023-04-21 DIAGNOSIS — C250 Malignant neoplasm of head of pancreas: Secondary | ICD-10-CM | POA: Diagnosis not present

## 2023-04-21 DIAGNOSIS — I700 Atherosclerosis of aorta: Secondary | ICD-10-CM | POA: Diagnosis not present

## 2023-04-21 DIAGNOSIS — C259 Malignant neoplasm of pancreas, unspecified: Secondary | ICD-10-CM | POA: Diagnosis not present

## 2023-04-21 DIAGNOSIS — D259 Leiomyoma of uterus, unspecified: Secondary | ICD-10-CM | POA: Diagnosis not present

## 2023-04-21 MED ORDER — IOHEXOL 300 MG/ML  SOLN
100.0000 mL | Freq: Once | INTRAMUSCULAR | Status: AC | PRN
  Administered 2023-04-21: 85 mL via INTRAVENOUS

## 2023-04-24 ENCOUNTER — Inpatient Hospital Stay: Payer: BC Managed Care – PPO

## 2023-04-24 ENCOUNTER — Inpatient Hospital Stay: Payer: BC Managed Care – PPO | Admitting: Nurse Practitioner

## 2023-04-24 ENCOUNTER — Encounter: Payer: Self-pay | Admitting: Nurse Practitioner

## 2023-04-24 ENCOUNTER — Encounter: Payer: Self-pay | Admitting: *Deleted

## 2023-04-24 VITALS — BP 120/90 | HR 99 | Temp 98.1°F | Resp 18 | Wt 186.5 lb

## 2023-04-24 DIAGNOSIS — G629 Polyneuropathy, unspecified: Secondary | ICD-10-CM | POA: Diagnosis not present

## 2023-04-24 DIAGNOSIS — Z95828 Presence of other vascular implants and grafts: Secondary | ICD-10-CM

## 2023-04-24 DIAGNOSIS — C7801 Secondary malignant neoplasm of right lung: Secondary | ICD-10-CM | POA: Diagnosis not present

## 2023-04-24 DIAGNOSIS — R1030 Lower abdominal pain, unspecified: Secondary | ICD-10-CM | POA: Diagnosis not present

## 2023-04-24 DIAGNOSIS — K769 Liver disease, unspecified: Secondary | ICD-10-CM | POA: Diagnosis not present

## 2023-04-24 DIAGNOSIS — Z86718 Personal history of other venous thrombosis and embolism: Secondary | ICD-10-CM | POA: Diagnosis not present

## 2023-04-24 DIAGNOSIS — C250 Malignant neoplasm of head of pancreas: Secondary | ICD-10-CM

## 2023-04-24 DIAGNOSIS — E119 Type 2 diabetes mellitus without complications: Secondary | ICD-10-CM | POA: Diagnosis not present

## 2023-04-24 DIAGNOSIS — Z9049 Acquired absence of other specified parts of digestive tract: Secondary | ICD-10-CM | POA: Diagnosis not present

## 2023-04-24 DIAGNOSIS — D649 Anemia, unspecified: Secondary | ICD-10-CM | POA: Diagnosis not present

## 2023-04-24 DIAGNOSIS — I10 Essential (primary) hypertension: Secondary | ICD-10-CM | POA: Diagnosis not present

## 2023-04-24 DIAGNOSIS — Z85528 Personal history of other malignant neoplasm of kidney: Secondary | ICD-10-CM | POA: Diagnosis not present

## 2023-04-24 DIAGNOSIS — C7802 Secondary malignant neoplasm of left lung: Secondary | ICD-10-CM | POA: Diagnosis not present

## 2023-04-24 DIAGNOSIS — Z7901 Long term (current) use of anticoagulants: Secondary | ICD-10-CM | POA: Diagnosis not present

## 2023-04-24 DIAGNOSIS — Z905 Acquired absence of kidney: Secondary | ICD-10-CM | POA: Diagnosis not present

## 2023-04-24 DIAGNOSIS — N6320 Unspecified lump in the left breast, unspecified quadrant: Secondary | ICD-10-CM | POA: Diagnosis not present

## 2023-04-24 DIAGNOSIS — Z79899 Other long term (current) drug therapy: Secondary | ICD-10-CM | POA: Diagnosis not present

## 2023-04-24 LAB — CBC WITH DIFFERENTIAL (CANCER CENTER ONLY)
Abs Immature Granulocytes: 0.04 10*3/uL (ref 0.00–0.07)
Basophils Absolute: 0.1 10*3/uL (ref 0.0–0.1)
Basophils Relative: 1 %
Eosinophils Absolute: 0 10*3/uL (ref 0.0–0.5)
Eosinophils Relative: 0 %
HCT: 27.5 % — ABNORMAL LOW (ref 36.0–46.0)
Hemoglobin: 8.6 g/dL — ABNORMAL LOW (ref 12.0–15.0)
Immature Granulocytes: 0 %
Lymphocytes Relative: 11 %
Lymphs Abs: 1.1 10*3/uL (ref 0.7–4.0)
MCH: 27.2 pg (ref 26.0–34.0)
MCHC: 31.3 g/dL (ref 30.0–36.0)
MCV: 87 fL (ref 80.0–100.0)
Monocytes Absolute: 1 10*3/uL (ref 0.1–1.0)
Monocytes Relative: 10 %
Neutro Abs: 8.3 10*3/uL — ABNORMAL HIGH (ref 1.7–7.7)
Neutrophils Relative %: 78 %
Platelet Count: 573 10*3/uL — ABNORMAL HIGH (ref 150–400)
RBC: 3.16 MIL/uL — ABNORMAL LOW (ref 3.87–5.11)
RDW: 17.2 % — ABNORMAL HIGH (ref 11.5–15.5)
WBC Count: 10.5 10*3/uL (ref 4.0–10.5)
nRBC: 0 % (ref 0.0–0.2)

## 2023-04-24 LAB — CMP (CANCER CENTER ONLY)
ALT: 7 U/L (ref 0–44)
AST: 13 U/L — ABNORMAL LOW (ref 15–41)
Albumin: 2.7 g/dL — ABNORMAL LOW (ref 3.5–5.0)
Alkaline Phosphatase: 158 U/L — ABNORMAL HIGH (ref 38–126)
Anion gap: 3 — ABNORMAL LOW (ref 5–15)
BUN: 9 mg/dL (ref 6–20)
CO2: 27 mmol/L (ref 22–32)
Calcium: 8.1 mg/dL — ABNORMAL LOW (ref 8.9–10.3)
Chloride: 103 mmol/L (ref 98–111)
Creatinine: 1.02 mg/dL — ABNORMAL HIGH (ref 0.44–1.00)
GFR, Estimated: >60 mL/min (ref >60)
Glucose, Bld: 207 mg/dL — ABNORMAL HIGH (ref 70–99)
Potassium: 3.5 mmol/L (ref 3.5–5.1)
Sodium: 133 mmol/L — ABNORMAL LOW (ref 135–145)
Total Bilirubin: 0.6 mg/dL (ref <1.2)
Total Protein: 8 g/dL (ref 6.5–8.1)

## 2023-04-24 NOTE — Progress Notes (Signed)
Referral to Dr Rod Mae or Dr Johny Blamer faxed to [facility] at [phone]

## 2023-04-24 NOTE — Progress Notes (Signed)
[facility] OFFICE PROGRESS NOTE   Diagnosis: Pancreas cancer  INTERVAL HISTORY:   Alyssa Ross returns as scheduled.  She is feeling better.  She reports good fluid intake.  Blood sugars are lower.  No nausea or vomiting.  No diarrhea.  Worsening neuropathy symptoms.  She does not want to take further Abraxane.  She reports recent night sweats and vaginal bleeding.  Objective:  Vital signs in last 24 hours:  Blood pressure (!) 120/90, pulse 99, temperature 98.1 F (36.7 C), temperature source Tympanic, resp. rate 18, weight 186 lb 8 oz (84.6 kg), last menstrual period 09/17/2012, SpO2 100%.    HEENT: No thrush or ulcers. Resp: Lungs clear bilaterally. Cardio: Regular rate and rhythm. GI: No hepatosplenomegaly. Vascular: No leg edema. Neuro: Alert and oriented. Port-A-Cath without erythema.  Lab Results:  Lab Results  Component Value Date   WBC 10.5 04/24/2023   HGB 8.6 (L) 04/24/2023   HCT 27.5 (L) 04/24/2023   MCV 87.0 04/24/2023   PLT 573 (H) 04/24/2023   NEUTROABS 8.3 (H) 04/24/2023    Imaging:  No results found.  Medications: I have reviewed the patient's current medications.  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 Tumor invades the duodenal wall and vascular groove, resection margins negative, 4/34 lymph nodes positive MSI-stable, tumor showed instability in 2 loci as did adjacent normal tissue Foundation 1-K-ras G12 V, microsatellite status and tumor mutation burden cannot be determined EUS FNA biopsy of pancreas mass on 07/03/2018-well-differentiated neuroendocrine tumor CTs 01/29/2019-ill-defined pancreas head mass, 5 pulmonary nodules-1 with a small amount of central cavitation, tumor abuts the left margin of the portal vein indistinct density surrounding, hepatic artery, complex cystic lesion of the right kidney, right adrenal mass-characterized as an adenoma on a [facility] MRI  12/21/2018 Netspot 03/03/2019-no focal pancreas activity, no tracer accumulation within the suspicious pulmonary nodules, left uterine mass with tracer accumulation felt to represent a leiomyoma Elevated preoperative CA 19-9--CA 19-9 186 on 01/18/2019 CT chest 04/16/2019-multiple bilateral pulmonary nodules, some with increased cavitation, stable in size Cycle 1 FOLFIRINOX 04/27/2019 Cycle 2 FOLFIRINOX 05/11/2019 Cycle 3 FOLFIRINOX 05/23/2019 Cycle 4 FOLFIRINOX 06/08/2019 Cycle 5 FOLFIRINOX 06/22/2019 CT chest 07/02/2019-stable size of bilateral pulmonary nodules.  Dominant cavitary lesions in both lungs show increased cavitation with thinner walls.  Stable 2.1 cm right adrenal nodule. 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 No definite evidence of metastatic disease in the abdomen or pelvis. Markedly enlarged and heterogeneous appearing uterus, likely to represent multifocal fibroids. 1 of these lesions appears to be an exophytic subserosal fibroid in the posterior lateral aspect of the uterine body on the left side although this comes in close proximity to the left adnexa such that a primary ovarian lesion is difficult to completely exclude. CTs 02/07/2020-slight enlargement of bilateral lung nodules, some are cavitary, no evidence of metastatic disease in the abdomen or pelvis, stable right adrenal nodule, uterine fibroids CTs 04/26/2020-mild enlargement of pulmonary nodules, slight increase in trace pelvic fluid, new soft tissue thickening inferior to the cecal tip suspicious for peritoneal  metastasis CT 05/26/2020-improved appearance of soft tissue at the inferior tip of the cecum, mildly thickened short  appendix-findings suggestive of resolving appendicitis, stable small bibasilar pulmonary nodules, fibroids Plan biopsy of right cecal tip soft tissue canceled secondary to radiologic improvement CT chest 08/02/2020-enlargement and progressive cavitation of multiple bilateral lung nodules.  Some new nodules are present. CTs 10/24/2020- increase in size of pulmonary nodules, no new nodules, no evidence of metastatic disease in the abdomen, stable right adrenal nodule CT 01/09/2021-slight interval enlargement of pulmonary nodules, stable right adrenal nodule Navigation bronchoscopy 01/30/2021-left lower lobe cavitary nodule FNA-adenocarcinoma, brushing-adenocarcinoma.  Left lower lobe lavage-adenocarcinoma.  Right upper lobe brushing and FNA biopsy of cavitary nodule-adenocarcinoma-immunohistochemical profile consistent with pancreas adenocarcinoma Cycle 1 gemcitabine/Abraxane 03/28/2021 Cycle 2 gemcitabine/Abraxane 04/11/2021 Cycle 3 gemcitabine/Abraxane 04/25/2021 Cycle 4 gemcitabine/Abraxane 05/09/2021 Cycle 5 gemcitabine/Abraxane 05/23/2021 CT chest 06/05/2021-interval cavitation of multiple pulmonary nodules, some nodules have decreased in size, no new or enlarging nodules Cycle 6 gemcitabine/Abraxane 06/06/2021 Cycle 7 gemcitabine/Abraxane 06/21/2021 Cycle 8 gemcitabine/Abraxane 07/05/2021 Cycle 9 Gemcitabine/Abraxane 07/19/2021 Cycle 10 gemcitabine 08/01/2021-Abraxane held secondary to neuropathy CT chest 08/13/2021-mild decrease in size and wall thickness of multiple cavitary nodules, no new or progressive disease in the chest, indeterminate low-attenuation right liver lesions Cycle 11 gemcitabine 08/16/2021-Abraxane held secondary to neuropathy Cycle 12 gemcitabine 08/30/2021-Abraxane held secondary to neuropathy Cycle 13 gemcitabine 09/13/2021-Abraxane held secondary to neuropathy Cycle 14  gemcitabine 09/27/2021-Abraxane held secondary to neuropathy Cycle 15 gemcitabine 10/11/2021-Abraxane held secondary to neuropathy CTs 10/22/2021-no change in multiple cavitary lung nodules, no evidence of disease progression, ill-defined hypodense lesion in the posterior right liver suspicious for metastatic disease Cycle 16 gemcitabine 10/25/2021 Cycle 17 gemcitabine 11/08/2021 Cycle 18 Gemcitabine 11/22/2021 Cycle 19 gemcitabine 12/06/2021 Cycle 20 Gemcitabine 12/20/2021 CT 12/31/2021-mild increase in size of bilateral pulmonary metastases, stable subtle continuation right liver lesions Cycle 20 gemcitabine/Abraxane 01/03/2022 Cycle 21 gemcitabine/Abraxane 01/17/2022 Cycle 22 gemcitabine/Abraxane 01/31/2022 Cycle 23 gemcitabine/Abraxane 02/14/2022 Cycle 24 gemcitabine/Abraxane 02/28/2022 CTs 03/11/2022-widespread metastatic disease to the lungs again noted with slight involution of several of the pulmonary nodules, no definite new nodules noted; interval cavitation of solid lesion in the posterior aspect right lobe of the liver, no new liver lesions noted. Cycle 25 Gemcitabine/Abraxane 03/14/2022 Cycle 26 gemcitabine/Abraxane 03/28/2022 Cycle 27 gemcitabine/Abraxane 04/25/2022 Cycle 28 gemcitabine/Abraxane 05/09/2022 Cycle 29 Gemcitabine 05/23/2022, Abraxane held due to neuropathy CTs 06/04/2022-stable multifocal cavitary pulmonary nodules.  No definite new nodules.  No new nodules greater than a centimeter.  Decreased size of the right posterior hepatic lobe lesion.  Generalized heterogeneity and new focal areas of hypodensity in the liver. Cycle 30 Gemcitabine 06/06/2022, Abraxane held due to neuropathy Cycle 31 gemcitabine 06/20/2022, Abraxane held due to neuropathy Cycle 32 gemcitabine 07/04/2022, Abraxane held due to neuropathy Cycle 33 gemcitabine  07/18/2022 ,Abraxane held due to neuropathy Cycle 34 gemcitabine 08/15/2022, Abraxane held due to neuropathy Cycle 35 gemcitabine 08/29/2022, Abraxane held due  to neuropathy CTs 09/10/2022-enlarging pulmonary metastases.  Subtle poorly defined hypoattenuating lesions in the liver, less well-seen than on 06/04/2022.  Suspected new lesion in the dome of the right hepatic lobe Cycle 36 gemcitabine/Abraxane 09/12/2022 Cycle 37 gemcitabine/Abraxane 09/26/2022 Cycle 38 gemcitabine/Abraxane 10/10/2022 Cycle 39 gemcitabine/Abraxane 11/07/2022 Cycle 40 gemcitabine/Abraxane 11/21/2022 Cycle 41 gemcitabine/Abraxane 12/05/2022 Cycle 42 gemcitabine/Abraxane 12/19/2022 CT is 12/30/2022-new left hepatic lesion, stable bilateral pulmonary metastases Cycle 43 gemcitabine/Abraxane 01/02/2023 CT-guided biopsy of liver lesion 01/27/2023-acute/chronic inflammation, negative for malignancy, culture negative Cycle 44 gemcitabine/Abraxane 01/30/2023 Cycle 45 gemcitabine/Abraxane 02/13/2023 Cycle 46 gemcitabine/Abraxane 02/27/2023 Cycle 47 gemcitabine/Abraxane 03/13/2023 Cycle 48 gemcitabine/Abraxane 03/27/2023 CTs 04/21/2023-numerous solid and cavitary lung nodules overall similar, some slightly enlarged; multiple new and enlarging rim-enhancing  liver lesions.   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  Apixaban  10/24/2022      Disposition: Alyssa Ross appears stable.  Last cycle of gemcitabine/Abraxane 03/27/2023.  Treatment has recently been on hold due to a decline in her clinical status.  She had restaging CTs 04/21/2023.  The final report was not available while she was in the office today.  We reviewed the images with her and her sister.  They understand there is likely evidence of disease progression.  Dr. Truett Perna discussed discontinuation of gemcitabine/Abraxane.  We reviewed potential treatment options including liposomal irinotecan/5-fluorouracil.  We discussed a referral to [facility] to discuss other treatment options, clinical trial availability.  She agrees to the referral.  She will return for follow-up and additional discussion in 2 weeks.  We are available to see her sooner if needed.  We will contact her once the final CT report is issued.  Patient seen with Dr. Truett Perna.  Lonna Cobb ANP/GNP-BC   04/24/2023  10:11 AM   Addendum 12:04 PM-final CT report shows numerous solid and cavitary lung nodules, generally similar, some slightly enlarged and some nodules now cavitary; multiple new and enlarged rim-enhancing liver lesions.  We will contact her with the final report, plan as above.  .  This was a shared visit with Lonna Cobb.  We reviewed the restaging CT findings and images with Alyssa Ross.  There are new and enlarging rim-enhancing liver lesions.  It is unclear whether these lesions are related to metastases or a benign process.  A biopsy hypodense liver lesion in August was negative for malignancy.  I will present her case at the GI tumor conference to discuss the indication for aspiration of the dominant right liver lesion.  She has developed progressive neuropathy and some of the lung lesions appear larger.  Gemcitabine/Abraxane will be discontinued.  We will refer her to do to consider standard and clinical trial treatment options.  We discussed 5-FU/liposomal irinotecan as a  standard Therapy.  I was present for greater than 50% of today's visit.  I performed medical decision making.  Mancel Bale, MD

## 2023-04-25 ENCOUNTER — Telehealth: Payer: Self-pay

## 2023-04-25 LAB — CANCER ANTIGEN 19-9: CA 19-9: 154 U/mL — ABNORMAL HIGH (ref 0–35)

## 2023-04-25 NOTE — Telephone Encounter (Signed)
-----   Message from Thornton Papas sent at 04/24/2023  2:01 PM EST ----- Please call patient, there are several new rim-enhancing liver lesions, these could represent metastasis or the same benign process seen on the liver biopsy, the lung lesions are generally stable with enlargement of a few lesions. I will present her case at the GI tumor conference next week, follow-up as scheduled

## 2023-04-25 NOTE — Telephone Encounter (Signed)
I spoke with the patient and she verbal understanding and had no further questions or concerns at this time

## 2023-04-30 ENCOUNTER — Other Ambulatory Visit: Payer: Self-pay | Admitting: *Deleted

## 2023-04-30 DIAGNOSIS — C259 Malignant neoplasm of pancreas, unspecified: Secondary | ICD-10-CM | POA: Diagnosis not present

## 2023-04-30 DIAGNOSIS — C7800 Secondary malignant neoplasm of unspecified lung: Secondary | ICD-10-CM | POA: Diagnosis not present

## 2023-04-30 NOTE — Progress Notes (Signed)
The proposed treatment discussed in conference is for discussion purpose only and is not a binding recommendation.  The patients have not been physically examined, or presented with their treatment options.  Therefore, final treatment plans cannot be decided.  

## 2023-05-02 DIAGNOSIS — C7800 Secondary malignant neoplasm of unspecified lung: Secondary | ICD-10-CM | POA: Diagnosis not present

## 2023-05-02 DIAGNOSIS — C259 Malignant neoplasm of pancreas, unspecified: Secondary | ICD-10-CM | POA: Diagnosis not present

## 2023-05-02 DIAGNOSIS — K739 Chronic hepatitis, unspecified: Secondary | ICD-10-CM | POA: Diagnosis not present

## 2023-05-03 ENCOUNTER — Other Ambulatory Visit: Payer: Self-pay | Admitting: Oncology

## 2023-05-07 ENCOUNTER — Inpatient Hospital Stay: Payer: BC Managed Care – PPO

## 2023-05-07 ENCOUNTER — Encounter: Payer: Self-pay | Admitting: Oncology

## 2023-05-07 ENCOUNTER — Inpatient Hospital Stay: Payer: BC Managed Care – PPO | Admitting: Oncology

## 2023-05-07 VITALS — BP 122/83 | HR 96 | Temp 98.2°F | Resp 18 | Ht 68.0 in | Wt 178.2 lb

## 2023-05-07 DIAGNOSIS — Z905 Acquired absence of kidney: Secondary | ICD-10-CM | POA: Diagnosis not present

## 2023-05-07 DIAGNOSIS — C250 Malignant neoplasm of head of pancreas: Secondary | ICD-10-CM

## 2023-05-07 DIAGNOSIS — Z79899 Other long term (current) drug therapy: Secondary | ICD-10-CM | POA: Diagnosis not present

## 2023-05-07 DIAGNOSIS — Z86718 Personal history of other venous thrombosis and embolism: Secondary | ICD-10-CM | POA: Diagnosis not present

## 2023-05-07 DIAGNOSIS — D649 Anemia, unspecified: Secondary | ICD-10-CM | POA: Diagnosis not present

## 2023-05-07 DIAGNOSIS — Z95828 Presence of other vascular implants and grafts: Secondary | ICD-10-CM

## 2023-05-07 DIAGNOSIS — I10 Essential (primary) hypertension: Secondary | ICD-10-CM | POA: Diagnosis not present

## 2023-05-07 DIAGNOSIS — N6320 Unspecified lump in the left breast, unspecified quadrant: Secondary | ICD-10-CM | POA: Diagnosis not present

## 2023-05-07 DIAGNOSIS — K769 Liver disease, unspecified: Secondary | ICD-10-CM | POA: Diagnosis not present

## 2023-05-07 DIAGNOSIS — R1030 Lower abdominal pain, unspecified: Secondary | ICD-10-CM | POA: Diagnosis not present

## 2023-05-07 DIAGNOSIS — C7801 Secondary malignant neoplasm of right lung: Secondary | ICD-10-CM | POA: Diagnosis not present

## 2023-05-07 DIAGNOSIS — Z7901 Long term (current) use of anticoagulants: Secondary | ICD-10-CM | POA: Diagnosis not present

## 2023-05-07 DIAGNOSIS — Z85528 Personal history of other malignant neoplasm of kidney: Secondary | ICD-10-CM | POA: Diagnosis not present

## 2023-05-07 DIAGNOSIS — G629 Polyneuropathy, unspecified: Secondary | ICD-10-CM | POA: Diagnosis not present

## 2023-05-07 DIAGNOSIS — C7802 Secondary malignant neoplasm of left lung: Secondary | ICD-10-CM | POA: Diagnosis not present

## 2023-05-07 DIAGNOSIS — Z9049 Acquired absence of other specified parts of digestive tract: Secondary | ICD-10-CM | POA: Diagnosis not present

## 2023-05-07 DIAGNOSIS — E119 Type 2 diabetes mellitus without complications: Secondary | ICD-10-CM | POA: Diagnosis not present

## 2023-05-07 LAB — CBC WITH DIFFERENTIAL (CANCER CENTER ONLY)
Abs Immature Granulocytes: 0.03 10*3/uL (ref 0.00–0.07)
Basophils Absolute: 0.1 10*3/uL (ref 0.0–0.1)
Basophils Relative: 1 %
Eosinophils Absolute: 0.1 10*3/uL (ref 0.0–0.5)
Eosinophils Relative: 2 %
HCT: 30 % — ABNORMAL LOW (ref 36.0–46.0)
Hemoglobin: 9.4 g/dL — ABNORMAL LOW (ref 12.0–15.0)
Immature Granulocytes: 0 %
Lymphocytes Relative: 16 %
Lymphs Abs: 1.4 10*3/uL (ref 0.7–4.0)
MCH: 27.8 pg (ref 26.0–34.0)
MCHC: 31.3 g/dL (ref 30.0–36.0)
MCV: 88.8 fL (ref 80.0–100.0)
Monocytes Absolute: 0.7 10*3/uL (ref 0.1–1.0)
Monocytes Relative: 8 %
Neutro Abs: 6.4 10*3/uL (ref 1.7–7.7)
Neutrophils Relative %: 73 %
Platelet Count: 389 10*3/uL (ref 150–400)
RBC: 3.38 MIL/uL — ABNORMAL LOW (ref 3.87–5.11)
RDW: 18.6 % — ABNORMAL HIGH (ref 11.5–15.5)
WBC Count: 8.7 10*3/uL (ref 4.0–10.5)
nRBC: 0 % (ref 0.0–0.2)

## 2023-05-07 LAB — CMP (CANCER CENTER ONLY)
ALT: 10 U/L (ref 0–44)
AST: 13 U/L — ABNORMAL LOW (ref 15–41)
Albumin: 3.1 g/dL — ABNORMAL LOW (ref 3.5–5.0)
Alkaline Phosphatase: 183 U/L — ABNORMAL HIGH (ref 38–126)
Anion gap: 5 (ref 5–15)
BUN: 12 mg/dL (ref 6–20)
CO2: 26 mmol/L (ref 22–32)
Calcium: 9.1 mg/dL (ref 8.9–10.3)
Chloride: 101 mmol/L (ref 98–111)
Creatinine: 1.02 mg/dL — ABNORMAL HIGH (ref 0.44–1.00)
GFR, Estimated: >60 mL/min (ref >60)
Glucose, Bld: 249 mg/dL — ABNORMAL HIGH (ref 70–99)
Potassium: 3.9 mmol/L (ref 3.5–5.1)
Sodium: 132 mmol/L — ABNORMAL LOW (ref 135–145)
Total Bilirubin: 0.6 mg/dL (ref <1.2)
Total Protein: 9 g/dL — ABNORMAL HIGH (ref 6.5–8.1)

## 2023-05-07 LAB — MAGNESIUM: Magnesium: 1.6 mg/dL — ABNORMAL LOW (ref 1.7–2.4)

## 2023-05-07 MED ORDER — HEPARIN SOD (PORK) LOCK FLUSH 100 UNIT/ML IV SOLN
500.0000 [IU] | Freq: Once | INTRAVENOUS | Status: AC
  Administered 2023-05-07: 500 [IU] via INTRAVENOUS

## 2023-05-07 NOTE — Progress Notes (Signed)
[facility] OFFICE PROGRESS NOTE   Diagnosis: Pancreas cancer  INTERVAL HISTORY:   Alyssa Ross returns as scheduled.  She continues to have anorexia and malaise.  No fever.  She saw Dr. Johny Blamer 04/30/2023.  She recommends changing to a different systemic therapy regimen.  She discussed FOLFIRI and 5-FU/liposomal irinotecan.  Ms. Vota has been placed on the waiting list for a K-ras inhibitor study.  She reports discomfort at the right shoulder.  She has pain in the upper abdomen when coughing.  Objective:  Vital signs in last 24 hours:  Blood pressure 122/83, pulse 96, temperature 98.2 F (36.8 C), temperature source Temporal, resp. rate 18, height 5\' 8"  (1.727 m), weight 178 lb 3.2 oz (80.8 kg), last menstrual period 09/17/2012, SpO2 100%.  Resp: Lungs clear bilaterally Cardio: Regular rate and rhythm GI: No hepatosplenomegaly, no mass Vascular: No leg edema    Portacath/PICC-without erythema  Lab Results:  Lab Results  Component Value Date   WBC 8.7 05/07/2023   HGB 9.4 (L) 05/07/2023   HCT 30.0 (L) 05/07/2023   MCV 88.8 05/07/2023   PLT 389 05/07/2023   NEUTROABS 6.4 05/07/2023    CMP  Lab Results  Component Value Date   NA 132 (L) 05/07/2023   K 3.9 05/07/2023   CL 101 05/07/2023   CO2 26 05/07/2023   GLUCOSE 249 (H) 05/07/2023   BUN 12 05/07/2023   CREATININE 1.02 (H) 05/07/2023   CALCIUM 9.1 05/07/2023   PROT 9.0 (H) 05/07/2023   ALBUMIN 3.1 (L) 05/07/2023   AST 13 (L) 05/07/2023   ALT 10 05/07/2023   ALKPHOS 183 (H) 05/07/2023   BILITOT 0.6 05/07/2023   GFRNONAA >60 05/07/2023   GFRAA >60 02/07/2020    Lab Results  Component Value Date   ZOX096 154 (H) 04/24/2023    Lab Results  Component Value Date   INR 1.0 01/27/2023   LABPROT 13.6 01/27/2023    Imaging:  No results found.  Medications: I have reviewed the patient's current medications.   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 Tumor invades the duodenal wall and vascular groove, resection margins negative, 4/34 lymph nodes positive MSI-stable, tumor showed instability in 2 loci as did adjacent normal tissue Foundation 1-K-ras G12 V, microsatellite status and tumor mutation burden cannot be determined EUS FNA biopsy of pancreas mass on 07/03/2018-well-differentiated neuroendocrine tumor CTs 01/29/2019-ill-defined pancreas head mass, 5 pulmonary nodules-1 with a small amount of central cavitation, tumor abuts the left margin of the portal vein indistinct density surrounding, hepatic artery, complex cystic lesion of the right kidney, right adrenal mass-characterized as an adenoma on a [facility] MRI 12/21/2018 [facility] 03/03/2019-no focal pancreas activity, no tracer accumulation within the suspicious pulmonary nodules, left uterine mass with tracer accumulation felt to represent a leiomyoma Elevated preoperative CA 19-9--CA 19-9 186 on 01/18/2019 CT chest 04/16/2019-multiple bilateral pulmonary nodules, some with increased cavitation, stable in size Cycle 1 FOLFIRINOX 04/27/2019 Cycle 2 FOLFIRINOX 05/11/2019 Cycle 3 FOLFIRINOX 05/23/2019 Cycle 4 FOLFIRINOX 06/08/2019 Cycle 5 FOLFIRINOX 06/22/2019 CT chest 07/02/2019-stable size of bilateral pulmonary nodules.  Dominant cavitary lesions in both lungs show increased cavitation with thinner walls.  Stable 2.1 cm right adrenal nodule. Cycle 6 FOLFIRINOX 07/06/2019 Cycle 7 FOLFIRINOX 07/21/2019 Cycle 8 FOLFIRINOX 08/03/2019, oxaliplatin deleted secondary to neuropathy CT chest 08/24/2019-decreased size of several lung nodules with resolution of a left upper lobe nodule, no new nodules Radiation to the pancreas surgical area with concurrent Xeloda 09/13/2019-10/20/2019  CTs 11/29/2019-multiple  small pulmonary nodules scattered throughout the lungs bilaterally, appear increased in number and size. No definite evidence of metastatic disease in the abdomen or pelvis. Markedly  enlarged and heterogeneous appearing uterus, likely to represent multifocal fibroids. 1 of these lesions appears to be an exophytic subserosal fibroid in the posterior lateral aspect of the uterine body on the left side although this comes in close proximity to the left adnexa such that a primary ovarian lesion is difficult to completely exclude. CTs 02/07/2020-slight enlargement of bilateral lung nodules, some are cavitary, no evidence of metastatic disease in the abdomen or pelvis, stable right adrenal nodule, uterine fibroids CTs 04/26/2020-mild enlargement of pulmonary nodules, slight increase in trace pelvic fluid, new soft tissue thickening inferior to the cecal tip suspicious for peritoneal metastasis CT 05/26/2020-improved appearance of soft tissue at the inferior tip of the cecum, mildly thickened short appendix-findings suggestive of resolving appendicitis, stable small bibasilar pulmonary nodules, fibroids Plan biopsy of right cecal tip soft tissue canceled secondary to radiologic improvement CT chest 08/02/2020-enlargement and progressive cavitation of multiple bilateral lung nodules.  Some new nodules are present. CTs 10/24/2020- increase in size of pulmonary nodules, no new nodules, no evidence of metastatic disease in the abdomen, stable right adrenal nodule CT 01/09/2021-slight interval enlargement of pulmonary nodules, stable right adrenal nodule Navigation bronchoscopy 01/30/2021-left lower lobe cavitary nodule FNA-adenocarcinoma, brushing-adenocarcinoma.  Left lower lobe lavage-adenocarcinoma.  Right upper lobe brushing and FNA biopsy of cavitary nodule-adenocarcinoma-immunohistochemical profile consistent with pancreas adenocarcinoma Cycle 1 gemcitabine/Abraxane 03/28/2021 Cycle 2 gemcitabine/Abraxane 04/11/2021 Cycle 3 gemcitabine/Abraxane 04/25/2021 Cycle 4 gemcitabine/Abraxane 05/09/2021 Cycle 5 gemcitabine/Abraxane 05/23/2021 CT chest 06/05/2021-interval cavitation of multiple  pulmonary nodules, some nodules have decreased in size, no new or enlarging nodules Cycle 6 gemcitabine/Abraxane 06/06/2021 Cycle 7 gemcitabine/Abraxane 06/21/2021 Cycle 8 gemcitabine/Abraxane 07/05/2021 Cycle 9 Gemcitabine/Abraxane 07/19/2021 Cycle 10 gemcitabine 08/01/2021-Abraxane held secondary to neuropathy CT chest 08/13/2021-mild decrease in size and wall thickness of multiple cavitary nodules, no new or progressive disease in the chest, indeterminate low-attenuation right liver lesions Cycle 11 gemcitabine 08/16/2021-Abraxane held secondary to neuropathy Cycle 12 gemcitabine 08/30/2021-Abraxane held secondary to neuropathy Cycle 13 gemcitabine 09/13/2021-Abraxane held secondary to neuropathy Cycle 14 gemcitabine 09/27/2021-Abraxane held secondary to neuropathy Cycle 15 gemcitabine 10/11/2021-Abraxane held secondary to neuropathy CTs 10/22/2021-no change in multiple cavitary lung nodules, no evidence of disease progression, ill-defined hypodense lesion in the posterior right liver suspicious for metastatic disease Cycle 16 gemcitabine 10/25/2021 Cycle 17 gemcitabine 11/08/2021 Cycle 18 Gemcitabine 11/22/2021 Cycle 19 gemcitabine 12/06/2021 Cycle 20 Gemcitabine 12/20/2021 CT 12/31/2021-mild increase in size of bilateral pulmonary metastases, stable subtle continuation right liver lesions Cycle 20 gemcitabine/Abraxane 01/03/2022 Cycle 21 gemcitabine/Abraxane 01/17/2022 Cycle 22 gemcitabine/Abraxane 01/31/2022 Cycle 23 gemcitabine/Abraxane 02/14/2022 Cycle 24 gemcitabine/Abraxane 02/28/2022 CTs 03/11/2022-widespread metastatic disease to the lungs again noted with slight involution of several of the pulmonary nodules, no definite new nodules noted; interval cavitation of solid lesion in the posterior aspect right lobe of the liver, no new liver lesions noted. Cycle 25 Gemcitabine/Abraxane 03/14/2022 Cycle 26 gemcitabine/Abraxane 03/28/2022 Cycle 27 gemcitabine/Abraxane 04/25/2022 Cycle 28 gemcitabine/Abraxane  05/09/2022 Cycle 29 Gemcitabine 05/23/2022, Abraxane held due to neuropathy CTs 06/04/2022-stable multifocal cavitary pulmonary nodules.  No definite new nodules.  No new nodules greater than a centimeter.  Decreased size of the right posterior hepatic lobe lesion.  Generalized heterogeneity and new focal areas of hypodensity in the liver. Cycle 30 Gemcitabine 06/06/2022, Abraxane held due to neuropathy Cycle 31 gemcitabine 06/20/2022, Abraxane held due to neuropathy Cycle 32 gemcitabine 07/04/2022, Abraxane held due to neuropathy  Cycle 33 gemcitabine  07/18/2022 ,Abraxane held due to neuropathy Cycle 34 gemcitabine 08/15/2022, Abraxane held due to neuropathy Cycle 35 gemcitabine 08/29/2022, Abraxane held due to neuropathy CTs 09/10/2022-enlarging pulmonary metastases.  Subtle poorly defined hypoattenuating lesions in the liver, less well-seen than on 06/04/2022.  Suspected new lesion in the dome of the right hepatic lobe Cycle 36 gemcitabine/Abraxane 09/12/2022 Cycle 37 gemcitabine/Abraxane 09/26/2022 Cycle 38 gemcitabine/Abraxane 10/10/2022 Cycle 39 gemcitabine/Abraxane 11/07/2022 Cycle 40 gemcitabine/Abraxane 11/21/2022 Cycle 41 gemcitabine/Abraxane 12/05/2022 Cycle 42 gemcitabine/Abraxane 12/19/2022 CT is 12/30/2022-new left hepatic lesion, stable bilateral pulmonary metastases Cycle 43 gemcitabine/Abraxane 01/02/2023 CT-guided biopsy of liver lesion 01/27/2023-acute/chronic inflammation, negative for malignancy, culture negative Cycle 44 gemcitabine/Abraxane 01/30/2023 Cycle 45 gemcitabine/Abraxane 02/13/2023 Cycle 46 gemcitabine/Abraxane 02/27/2023 Cycle 47 gemcitabine/Abraxane 03/13/2023 Cycle 48 gemcitabine/Abraxane 03/27/2023 CTs 04/21/2023-numerous solid and cavitary lung nodules overall similar, some slightly enlarged; multiple new and enlarging rim-enhancing liver lesions.   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  Apixaban 10/24/2022       Disposition: [name] has metastatic pancreas cancer.  There is clinical and radiologic evidence of disease progression.  Her case was presented at the GI tumor conference on 04/30/2023.  The rim-enhancing liver lesions are felt to be metastasis.  Drainage of a lesion was not recommended.  She saw Dr. Johny Blamer.  She recommends a change in the systemic therapy regimen.  Ms. Motherway has been placed on a waiting list for a K-ras inhibitor study.  Ms. Balek has significant neuropathy.  This has progressed.  I recommend discontinuing gemcitabine/Abraxane.  We discussed FOLFIRI and FOLFIRI/liposomal irinotecan.  I recommend FOLFIRI/liposomal irinotecan.  We reviewed potential toxicities associated with this regimen including the chance of nausea, alopecia, mucositis, diarrhea, and hematologic toxicity.  We discussed the hyperpigmentation, rash, cardiotoxicity, and hand/foot syndrome associated with 5-fluorouracil.  She agrees to proceed.  She would like to begin treatment on 05/21/2023.  A treatment plan was entered today.  Thornton Papas, MD  05/07/2023  9:38 AM

## 2023-05-07 NOTE — Progress Notes (Signed)
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     A cycle is every 28 days:     Nab-paclitaxel (protein bound)      Gemcitabine   **Always confirm dose/schedule in your pharmacy ordering system**  REASON: Disease Progression PRIOR TREATMENT: [id number]: Nab-Paclitaxel (Abraxane) 125 mg/m2 D1, 8, 15 + Gemcitabine 1,000 mg/m2 D1, 8, 15 q28 Days Until Progression or Toxicity TREATMENT RESPONSE: Partial Response (PR)  START OFF PATHWAY REGIMEN - Pancreatic Adenocarcinoma   [id number]:Fluorouracil 1,200 mg/m2/day CIV D1-2 + Leucovorin 400 mg/m2 IV D1 + Liposomal Irinotecan 70 mg/m2 IV D1 q14 Days:   A cycle is every 14 days:     Liposomal irinotecan      Leucovorin      Fluorouracil   **Always confirm dose/schedule in your pharmacy ordering system**  Patient Characteristics: Metastatic Disease, Third Line and Beyond, MSS/pMMR or MSI Unknown Therapeutic Status: Metastatic Disease Line of Therapy: [employer] manager Status: MSS/pMMR Intent of Therapy: Non-Curative / Palliative Intent, Discussed with Patient

## 2023-05-13 NOTE — Progress Notes (Signed)
Pharmacist Chemotherapy Monitoring - Initial Assessment    Anticipated start date: 05/21/23   The following has been reviewed per standard work regarding the patient's treatment regimen: The patient's diagnosis, treatment plan and drug doses, and organ/hematologic function Lab orders and baseline tests specific to treatment regimen  The treatment plan start date, drug sequencing, and pre-medications Prior authorization status  Patient's documented medication list, including drug-drug interaction screen and prescriptions for anti-emetics and supportive care specific to the treatment regimen The drug concentrations, fluid compatibility, administration routes, and timing of the medications to be used The patient's access for treatment and lifetime cumulative dose history, if applicable  The patient's medication allergies and previous infusion related reactions, if applicable   Changes made to treatment plan:  N/A  Follow up needed:  N/A   Daylene Katayama, [facility], 05/13/2023  12:24 PM

## 2023-05-14 ENCOUNTER — Other Ambulatory Visit: Payer: Self-pay | Admitting: Internal Medicine

## 2023-05-15 ENCOUNTER — Encounter: Payer: Self-pay | Admitting: *Deleted

## 2023-05-15 NOTE — Progress Notes (Signed)
Faxed last office note and schedule for next treatment to [employer] case manager with patient's verbal permission.

## 2023-05-17 ENCOUNTER — Other Ambulatory Visit: Payer: Self-pay | Admitting: Oncology

## 2023-05-21 ENCOUNTER — Ambulatory Visit: Payer: BC Managed Care – PPO | Admitting: Nurse Practitioner

## 2023-05-21 ENCOUNTER — Other Ambulatory Visit: Payer: BC Managed Care – PPO

## 2023-05-21 ENCOUNTER — Inpatient Hospital Stay: Payer: BC Managed Care – PPO

## 2023-05-21 ENCOUNTER — Inpatient Hospital Stay (HOSPITAL_BASED_OUTPATIENT_CLINIC_OR_DEPARTMENT_OTHER): Payer: BC Managed Care – PPO | Admitting: Nurse Practitioner

## 2023-05-21 ENCOUNTER — Ambulatory Visit: Payer: BC Managed Care – PPO

## 2023-05-21 ENCOUNTER — Other Ambulatory Visit: Payer: Self-pay

## 2023-05-21 ENCOUNTER — Inpatient Hospital Stay: Payer: BC Managed Care – PPO | Attending: Genetic Counselor

## 2023-05-21 ENCOUNTER — Encounter: Payer: Self-pay | Admitting: Nurse Practitioner

## 2023-05-21 VITALS — BP 112/81 | HR 90 | Temp 98.1°F | Resp 18 | Ht 68.0 in | Wt 178.7 lb

## 2023-05-21 VITALS — BP 116/76 | HR 76 | Temp 97.9°F

## 2023-05-21 DIAGNOSIS — C7802 Secondary malignant neoplasm of left lung: Secondary | ICD-10-CM | POA: Insufficient documentation

## 2023-05-21 DIAGNOSIS — Z5111 Encounter for antineoplastic chemotherapy: Secondary | ICD-10-CM | POA: Diagnosis not present

## 2023-05-21 DIAGNOSIS — C250 Malignant neoplasm of head of pancreas: Secondary | ICD-10-CM

## 2023-05-21 DIAGNOSIS — C7801 Secondary malignant neoplasm of right lung: Secondary | ICD-10-CM | POA: Diagnosis not present

## 2023-05-21 DIAGNOSIS — C3411 Malignant neoplasm of upper lobe, right bronchus or lung: Secondary | ICD-10-CM | POA: Insufficient documentation

## 2023-05-21 DIAGNOSIS — I10 Essential (primary) hypertension: Secondary | ICD-10-CM | POA: Diagnosis not present

## 2023-05-21 DIAGNOSIS — C259 Malignant neoplasm of pancreas, unspecified: Secondary | ICD-10-CM | POA: Diagnosis not present

## 2023-05-21 DIAGNOSIS — Z79899 Other long term (current) drug therapy: Secondary | ICD-10-CM | POA: Diagnosis not present

## 2023-05-21 DIAGNOSIS — E1140 Type 2 diabetes mellitus with diabetic neuropathy, unspecified: Secondary | ICD-10-CM | POA: Diagnosis not present

## 2023-05-21 LAB — CBC WITH DIFFERENTIAL (CANCER CENTER ONLY)
Abs Immature Granulocytes: 0.02 10*3/uL (ref 0.00–0.07)
Basophils Absolute: 0.1 10*3/uL (ref 0.0–0.1)
Basophils Relative: 1 %
Eosinophils Absolute: 0.2 10*3/uL (ref 0.0–0.5)
Eosinophils Relative: 3 %
HCT: 28.3 % — ABNORMAL LOW (ref 36.0–46.0)
Hemoglobin: 8.9 g/dL — ABNORMAL LOW (ref 12.0–15.0)
Immature Granulocytes: 0 %
Lymphocytes Relative: 21 %
Lymphs Abs: 1.4 10*3/uL (ref 0.7–4.0)
MCH: 28.2 pg (ref 26.0–34.0)
MCHC: 31.4 g/dL (ref 30.0–36.0)
MCV: 89.6 fL (ref 80.0–100.0)
Monocytes Absolute: 0.5 10*3/uL (ref 0.1–1.0)
Monocytes Relative: 7 %
Neutro Abs: 4.5 10*3/uL (ref 1.7–7.7)
Neutrophils Relative %: 68 %
Platelet Count: 450 10*3/uL — ABNORMAL HIGH (ref 150–400)
RBC: 3.16 MIL/uL — ABNORMAL LOW (ref 3.87–5.11)
RDW: 17.5 % — ABNORMAL HIGH (ref 11.5–15.5)
WBC Count: 6.6 10*3/uL (ref 4.0–10.5)
nRBC: 0 % (ref 0.0–0.2)

## 2023-05-21 LAB — CMP (CANCER CENTER ONLY)
ALT: 13 U/L (ref 0–44)
AST: 16 U/L (ref 15–41)
Albumin: 3.1 g/dL — ABNORMAL LOW (ref 3.5–5.0)
Alkaline Phosphatase: 145 U/L — ABNORMAL HIGH (ref 38–126)
Anion gap: 5 (ref 5–15)
BUN: 10 mg/dL (ref 6–20)
CO2: 23 mmol/L (ref 22–32)
Calcium: 9.2 mg/dL (ref 8.9–10.3)
Chloride: 108 mmol/L (ref 98–111)
Creatinine: 1.2 mg/dL — ABNORMAL HIGH (ref 0.44–1.00)
GFR, Estimated: 53 mL/min — ABNORMAL LOW (ref >60)
Glucose, Bld: 126 mg/dL — ABNORMAL HIGH (ref 70–99)
Potassium: 3.8 mmol/L (ref 3.5–5.1)
Sodium: 136 mmol/L (ref 135–145)
Total Bilirubin: 0.5 mg/dL (ref <1.2)
Total Protein: 8.6 g/dL — ABNORMAL HIGH (ref 6.5–8.1)

## 2023-05-21 LAB — MAGNESIUM: Magnesium: 1.6 mg/dL — ABNORMAL LOW (ref 1.7–2.4)

## 2023-05-21 MED ORDER — DEXAMETHASONE SODIUM PHOSPHATE 10 MG/ML IJ SOLN
10.0000 mg | Freq: Once | INTRAMUSCULAR | Status: AC
  Administered 2023-05-21: 10 mg via INTRAVENOUS
  Filled 2023-05-21: qty 1

## 2023-05-21 MED ORDER — SODIUM CHLORIDE 0.9 % IV SOLN
400.0000 mg/m2 | Freq: Once | INTRAVENOUS | Status: AC
  Administered 2023-05-21: 788 mg via INTRAVENOUS
  Filled 2023-05-21: qty 39.4

## 2023-05-21 MED ORDER — SODIUM CHLORIDE 0.9 % IV SOLN
2400.0000 mg/m2 | INTRAVENOUS | Status: DC
  Administered 2023-05-21: 5000 mg via INTRAVENOUS
  Filled 2023-05-21: qty 100

## 2023-05-21 MED ORDER — SODIUM CHLORIDE 0.9 % IV SOLN
70.0000 mg/m2 | Freq: Once | INTRAVENOUS | Status: AC
  Administered 2023-05-21: 137.6 mg via INTRAVENOUS
  Filled 2023-05-21: qty 32

## 2023-05-21 MED ORDER — PROCHLORPERAZINE MALEATE 10 MG PO TABS: ORAL_TABLET | ORAL | 2 refills | Status: DC

## 2023-05-21 MED ORDER — ATROPINE SULFATE 1 MG/ML IV SOLN
0.5000 mg | Freq: Once | INTRAVENOUS | Status: AC | PRN
  Administered 2023-05-21: 0.5 mg via INTRAVENOUS
  Filled 2023-05-21: qty 1

## 2023-05-21 MED ORDER — SODIUM CHLORIDE 0.9 % IV SOLN: INTRAVENOUS | Status: DC

## 2023-05-21 MED ORDER — MAGNESIUM SULFATE 2 GM/50ML IV SOLN
2.0000 g | Freq: Once | INTRAVENOUS | Status: AC
Start: 2023-05-21 — End: 2023-05-21
  Administered 2023-05-21: 2 g via INTRAVENOUS
  Filled 2023-05-21: qty 50

## 2023-05-21 MED ORDER — PALONOSETRON HCL INJECTION 0.25 MG/5ML
0.2500 mg | Freq: Once | INTRAVENOUS | Status: AC
  Administered 2023-05-21: 0.25 mg via INTRAVENOUS
  Filled 2023-05-21: qty 5

## 2023-05-21 MED ORDER — SODIUM CHLORIDE 0.9% FLUSH
10.0000 mL | INTRAVENOUS | Status: DC | PRN
  Administered 2023-05-21: 10 mL

## 2023-05-21 MED ORDER — ONDANSETRON HCL 8 MG PO TABS: 8.0000 mg | ORAL_TABLET | Freq: Three times a day (TID) | ORAL | 3 refills | Status: DC | PRN

## 2023-05-21 NOTE — Patient Instructions (Signed)

## 2023-05-21 NOTE — Patient Instructions (Signed)
[facility] A DEPT OF [facility]  Discharge Instructions: Thank you for choosing [facility] to provide your oncology and hematology care.   If you have a lab appointment with the Cancer Center, please go directly to the Cancer Center and check in at the registration area.   Wear comfortable clothing and clothing appropriate for easy access to any Portacath or PICC line.   We strive to give you quality time with your provider. You may need to reschedule your appointment if you arrive late (15 or more minutes).  Arriving late affects you and other patients whose appointments are after yours.  Also, if you miss three or more appointments without notifying the office, you may be dismissed from the clinic at the provider's discretion.      For prescription refill requests, have your pharmacy contact our office and allow 72 hours for refills to be completed.    Today you received the following chemotherapy and/or immunotherapy agents: liposomal irinotecan, leucovorin, fluorouracil      To help prevent nausea and vomiting after your treatment, we encourage you to take your nausea medication as directed.  BELOW ARE SYMPTOMS THAT SHOULD BE REPORTED IMMEDIATELY: *FEVER GREATER THAN 100.4 F (38 C) OR HIGHER *CHILLS OR SWEATING *NAUSEA AND VOMITING THAT IS NOT CONTROLLED WITH YOUR NAUSEA MEDICATION *UNUSUAL SHORTNESS OF BREATH *UNUSUAL BRUISING OR BLEEDING *URINARY PROBLEMS (pain or burning when urinating, or frequent urination) *BOWEL PROBLEMS (unusual diarrhea, constipation, pain near the anus) TENDERNESS IN MOUTH AND THROAT WITH OR WITHOUT PRESENCE OF ULCERS (sore throat, sores in mouth, or a toothache) UNUSUAL RASH, SWELLING OR PAIN  UNUSUAL VAGINAL DISCHARGE OR ITCHING   Items with * indicate a potential emergency and should be followed up as soon as possible or go to the Emergency Department if any problems should occur.  Please show the  CHEMOTHERAPY ALERT CARD or IMMUNOTHERAPY ALERT CARD at check-in to the Emergency Department and triage nurse.  Should you have questions after your visit or need to cancel or reschedule your appointment, please contact [facility]  Dept: [phone]  and follow the prompts.  Office hours are 8:00 a.m. to 4:30 p.m. Monday - Friday. Please note that voicemails left after 4:00 p.m. may not be returned until the following business day.  We are closed weekends and major holidays. You have access to a nurse at all times for urgent questions. Please call the main number to the clinic Dept: [phone] and follow the prompts.   For any non-urgent questions, you may also contact your provider using MyChart. We now offer e-Visits for anyone 34 and older to request care online for non-urgent symptoms. For details visit [email or web address].   Also download the MyChart app! Go to the app store, search "MyChart", open the app, select [facility], and log in with your MyChart username and password.  Irinotecan Liposome Injection What is this medication? IRINOTECAN LIPOSOME (eye ri noe TEE kan LIP oh som) treats pancreatic cancer. It works by slowing down the growth of cancer cells. This medicine may be used for other purposes; ask your health care provider or pharmacist if you have questions. COMMON BRAND NAME(S): ONIVYDE What should I tell my care team before I take this medication? They need to know if you have any of these conditions: Blockage in your bowels Dehydration Infection Low white blood cell levels Lung disease An unusual or allergic  reaction to irinotecan liposome, irinotecan, other medications, foods, dyes, or preservatives Pregnant or trying to get pregnant Breast-feeding How should I use this medication? This medication is injected into a vein. It is given by your care team in a hospital or clinic setting. Talk to your care team  about the use of this medication in children. Special care may be needed. Overdosage: If you think you have taken too much of this medicine contact a poison control center or emergency room at once. NOTE: This medicine is only for you. Do not share this medicine with others. What if I miss a dose? Keep appointments for follow-up doses. It is important not to miss your dose. Call your care team if you are unable to keep an appointment. What may interact with this medication? Do not take this medication with any of the following: Itraconazole This medication may also interact with the following: Certain antivirals for HIV or AIDS Certain medications for seizures, such as carbamazepine, fosphenytoin, phenytoin, phenobarbital Clarithromycin Gemfibrozil Nefazodone Rifabutin Rifampin Rifapentine St. John's Wort Voriconazole This list may not describe all possible interactions. Give your health care provider a list of all the medicines, herbs, non-prescription drugs, or dietary supplements you use. Also tell them if you smoke, drink alcohol, or use illegal drugs. Some items may interact with your medicine. What should I watch for while using this medication? This medication may make you feel generally unwell. This is not uncommon as chemotherapy can affect healthy cells as well as cancer cells. Report any side effects. Continue your course of treatment even though you feel ill unless your care team tells you to stop. You may need blood work while you are taking this medication. This medication can cause serious side effects and allergic reactions. To reduce your risk, your care team may give you other medications to take before receiving this one. Be sure to follow the directions from your care team. Check with your care team if you get an attack of diarrhea, nausea and vomiting, or if you sweat a lot. The loss of too much body fluid can make it dangerous for you to take this medication. This  medication may cause infertility. Talk to your care team if you are concerned about your fertility. Talk to your care team if you wish to become pregnant or if you think you might be pregnant. This medication can cause serious birth defects if taken during pregnancy or if you get pregnant within 7 months after stopping therapy. A negative pregnancy test is required before starting this medication. A reliable form of contraception is recommended while taking this medication and for 7 months after stopping it. Talk to your care team about reliable forms of contraception. Use a condom during sex and for 4 months after stopping therapy. Tell your care team right away if you think your partner might be pregnant. This medication can cause serious birth defects. Do not breast-feed while taking this medication and for 1 month after stopping therapy. This medication may increase your risk of getting an infection. Call your care team for advice if you get a fever, chills, sore throat, or other symptoms of a cold or flu. Do not treat yourself. Try to avoid being around people who are sick. Avoid taking medications that contain aspirin, acetaminophen, ibuprofen, naproxen, or ketoprofen unless instructed by your care team. These medications may hide a fever. Be careful brushing or flossing your teeth or using a toothpick because you may get an infection or bleed more  easily. If you have any dental work done, tell your dentist you are receiving this medication. What side effects may I notice from receiving this medication? Side effects that you should report to your care team as soon as possible: Allergic reactions or angioedema--skin rash, itching or hives, swelling of the face, eyes, lips, tongue, arms, or legs, trouble swallowing or breathing Dry cough, shortness of breath or trouble breathing Diarrhea Infection--fever, chills, cough, or sore throat Side effects that usually do not require medical attention  (report to your care team if they continue or are bothersome): Fatigue Loss of appetite Nausea Pain, redness, or swelling with sores inside the mouth or throat Vomiting This list may not describe all possible side effects. Call your doctor for medical advice about side effects. You may report side effects to FDA at 1-800-FDA-1088. Where should I keep my medication? This medication is given in a hospital or clinic. It will not be stored at home. NOTE: This sheet is a summary. It may not cover all possible information. If you have questions about this medicine, talk to your doctor, pharmacist, or health care provider.  2024 Elsevier/Gold Standard (2021-07-26 00:00:00) Leucovorin Injection What is this medication? LEUCOVORIN (loo koe VOR in) prevents side effects from certain medications, such as methotrexate. It works by increasing folate levels. This helps protect healthy cells in your body. It may also be used to treat anemia caused by low levels of folate. It can also be used with fluorouracil, a type of chemotherapy, to treat colorectal cancer. It works by increasing the effects of fluorouracil in the body. This medicine may be used for other purposes; ask your health care provider or pharmacist if you have questions. What should I tell my care team before I take this medication? They need to know if you have any of these conditions: Anemia from low levels of vitamin B12 in the blood An unusual or allergic reaction to leucovorin, folic acid, other medications, foods, dyes, or preservatives Pregnant or trying to get pregnant Breastfeeding How should I use this medication? This medication is injected into a vein or a muscle. It is given by your care team in a hospital or clinic setting. Talk to your care team about the use of this medication in children. Special care may be needed. Overdosage: If you think you have taken too much of this medicine contact a poison control center or emergency  room at once. NOTE: This medicine is only for you. Do not share this medicine with others. What if I miss a dose? Keep appointments for follow-up doses. It is important not to miss your dose. Call your care team if you are unable to keep an appointment. What may interact with this medication? Capecitabine Fluorouracil Phenobarbital Phenytoin Primidone Trimethoprim;sulfamethoxazole This list may not describe all possible interactions. Give your health care provider a list of all the medicines, herbs, non-prescription drugs, or dietary supplements you use. Also tell them if you smoke, drink alcohol, or use illegal drugs. Some items may interact with your medicine. What should I watch for while using this medication? Your condition will be monitored carefully while you are receiving this medication. This medication may increase the side effects of 5-fluorouracil. Tell your care team if you have diarrhea or mouth sores that do not get better or that get worse. What side effects may I notice from receiving this medication? Side effects that you should report to your care team as soon as possible: Allergic reactions--skin rash, itching, hives, swelling  of the face, lips, tongue, or throat This list may not describe all possible side effects. Call your doctor for medical advice about side effects. You may report side effects to FDA at 1-800-FDA-1088. Where should I keep my medication? This medication is given in a hospital or clinic. It will not be stored at home. NOTE: This sheet is a summary. It may not cover all possible information. If you have questions about this medicine, talk to your doctor, pharmacist, or health care provider.  2024 Elsevier/Gold Standard (2021-10-30 00:00:00) Fluorouracil Injection What is this medication? FLUOROURACIL (flure oh YOOR a sil) treats some types of cancer. It works by slowing down the growth of cancer cells. This medicine may be used for other purposes; ask  your health care provider or pharmacist if you have questions. COMMON BRAND NAME(S): Adrucil What should I tell my care team before I take this medication? They need to know if you have any of these conditions: Blood disorders Dihydropyrimidine dehydrogenase (DPD) deficiency Infection, such as chickenpox, cold sores, herpes Kidney disease Liver disease Poor nutrition Recent or ongoing radiation therapy An unusual or allergic reaction to fluorouracil, other medications, foods, dyes, or preservatives If you or your partner are pregnant or trying to get pregnant Breast-feeding How should I use this medication? This medication is injected into a vein. It is administered by your care team in a hospital or clinic setting. Talk to your care team about the use of this medication in children. Special care may be needed. Overdosage: If you think you have taken too much of this medicine contact a poison control center or emergency room at once. NOTE: This medicine is only for you. Do not share this medicine with others. What if I miss a dose? Keep appointments for follow-up doses. It is important not to miss your dose. Call your care team if you are unable to keep an appointment. What may interact with this medication? Do not take this medication with any of the following: Live virus vaccines This medication may also interact with the following: Medications that treat or prevent blood clots, such as warfarin, enoxaparin, dalteparin This list may not describe all possible interactions. Give your health care provider a list of all the medicines, herbs, non-prescription drugs, or dietary supplements you use. Also tell them if you smoke, drink alcohol, or use illegal drugs. Some items may interact with your medicine. What should I watch for while using this medication? Your condition will be monitored carefully while you are receiving this medication. This medication may make you feel generally unwell.  This is not uncommon as chemotherapy can affect healthy cells as well as cancer cells. Report any side effects. Continue your course of treatment even though you feel ill unless your care team tells you to stop. In some cases, you may be given additional medications to help with side effects. Follow all directions for their use. This medication may increase your risk of getting an infection. Call your care team for advice if you get a fever, chills, sore throat, or other symptoms of a cold or flu. Do not treat yourself. Try to avoid being around people who are sick. This medication may increase your risk to bruise or bleed. Call your care team if you notice any unusual bleeding. Be careful brushing or flossing your teeth or using a toothpick because you may get an infection or bleed more easily. If you have any dental work done, tell your dentist you are receiving this medication. Avoid  taking medications that contain aspirin, acetaminophen, ibuprofen, naproxen, or ketoprofen unless instructed by your care team. These medications may hide a fever. Do not treat diarrhea with over the counter products. Contact your care team if you have diarrhea that lasts more than 2 days or if it is severe and watery. This medication can make you more sensitive to the sun. Keep out of the sun. If you cannot avoid being in the sun, wear protective clothing and sunscreen. Do not use sun lamps, tanning beds, or tanning booths. Talk to your care team if you or your partner wish to become pregnant or think you might be pregnant. This medication can cause serious birth defects if taken during pregnancy and for 3 months after the last dose. A reliable form of contraception is recommended while taking this medication and for 3 months after the last dose. Talk to your care team about effective forms of contraception. Do not father a child while taking this medication and for 3 months after the last dose. Use a condom while having sex  during this time period. Do not breastfeed while taking this medication. This medication may cause infertility. Talk to your care team if you are concerned about your fertility. What side effects may I notice from receiving this medication? Side effects that you should report to your care team as soon as possible: Allergic reactions--skin rash, itching, hives, swelling of the face, lips, tongue, or throat Heart attack--pain or tightness in the chest, shoulders, arms, or jaw, nausea, shortness of breath, cold or clammy skin, feeling faint or lightheaded Heart failure--shortness of breath, swelling of the ankles, feet, or hands, sudden weight gain, unusual weakness or fatigue Heart rhythm changes--fast or irregular heartbeat, dizziness, feeling faint or lightheaded, chest pain, trouble breathing High ammonia level--unusual weakness or fatigue, confusion, loss of appetite, nausea, vomiting, seizures Infection--fever, chills, cough, sore throat, wounds that don't heal, pain or trouble when passing urine, general feeling of discomfort or being unwell Low red blood cell level--unusual weakness or fatigue, dizziness, headache, trouble breathing Pain, tingling, or numbness in the hands or feet, muscle weakness, change in vision, confusion or trouble speaking, loss of balance or coordination, trouble walking, seizures Redness, swelling, and blistering of the skin over hands and feet Severe or prolonged diarrhea Unusual bruising or bleeding Side effects that usually do not require medical attention (report to your care team if they continue or are bothersome): Dry skin Headache Increased tears Nausea Pain, redness, or swelling with sores inside the mouth or throat Sensitivity to light Vomiting This list may not describe all possible side effects. Call your doctor for medical advice about side effects. You may report side effects to FDA at 1-800-FDA-1088. Where should I keep my medication? This  medication is given in a hospital or clinic. It will not be stored at home. NOTE: This sheet is a summary. It may not cover all possible information. If you have questions about this medicine, talk to your doctor, pharmacist, or health care provider.  2024 Elsevier/Gold Standard (2021-10-02 00:00:00)

## 2023-05-21 NOTE — Progress Notes (Signed)
[facility] OFFICE PROGRESS NOTE   Diagnosis: Pancreas cancer  INTERVAL HISTORY:   Alyssa Ross returns as scheduled.  Alyssa Ross is scheduled to begin treatment today with FOLFIRI with liposomal irinotecan.  Alyssa Ross denies nausea.  No mouth sores.  No diarrhea.  Blood sugar level varies.  Objective:  Vital signs in last 24 hours:  Blood pressure 112/81, pulse 90, temperature 98.1 F (36.7 C), temperature source Temporal, resp. rate 18, height 5\' 8"  (1.727 m), weight 178 lb 11.2 oz (81.1 kg), last menstrual period 09/17/2012, SpO2 100%.    HEENT: No thrush or ulcers. Resp: Lungs clear bilaterally. Cardio: Regular rate and rhythm. GI: No hepatosplenomegaly. Vascular: No leg edema. Port-A-Cath without erythema.  Lab Results:  Lab Results  Component Value Date   WBC 6.6 05/21/2023   HGB 8.9 (L) 05/21/2023   HCT 28.3 (L) 05/21/2023   MCV 89.6 05/21/2023   PLT 450 (H) 05/21/2023   NEUTROABS 4.5 05/21/2023    Imaging:  No results found.  Medications: I have reviewed the patient's current medications.  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 Tumor invades the duodenal wall and vascular groove, resection margins negative, 4/34 lymph nodes positive MSI-stable, tumor showed instability in 2 loci as did adjacent normal tissue Foundation 1-K-ras G12 V, microsatellite status and tumor mutation burden cannot be determined EUS FNA biopsy of pancreas mass on 07/03/2018-well-differentiated neuroendocrine tumor CTs 01/29/2019-ill-defined pancreas head mass, 5 pulmonary nodules-1 with a small amount of central cavitation, tumor abuts the left margin of the portal vein indistinct density surrounding, hepatic artery, complex cystic lesion of the right kidney, right adrenal mass-characterized as an adenoma on a [facility] MRI 12/21/2018 Netspot 03/03/2019-no focal pancreas activity, no tracer accumulation within the suspicious  pulmonary nodules, left uterine mass with tracer accumulation felt to represent a leiomyoma Elevated preoperative CA 19-9--CA 19-9 186 on 01/18/2019 CT chest 04/16/2019-multiple bilateral pulmonary nodules, some with increased cavitation, stable in size Cycle 1 FOLFIRINOX 04/27/2019 Cycle 2 FOLFIRINOX 05/11/2019 Cycle 3 FOLFIRINOX 05/23/2019 Cycle 4 FOLFIRINOX 06/08/2019 Cycle 5 FOLFIRINOX 06/22/2019 CT chest 07/02/2019-stable size of bilateral pulmonary nodules.  Dominant cavitary lesions in both lungs show increased cavitation with thinner walls.  Stable 2.1 cm right adrenal nodule. 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 No definite evidence of metastatic disease in the abdomen or pelvis. Markedly enlarged and heterogeneous appearing uterus, likely to represent multifocal fibroids. 1 of these lesions Ross to be an exophytic subserosal fibroid in the posterior lateral aspect of the uterine body on the left side although this comes in close proximity to the left adnexa such that a primary ovarian lesion is difficult to completely exclude. CTs 02/07/2020-slight enlargement of bilateral lung nodules, some are cavitary, no evidence of metastatic disease in the abdomen or pelvis, stable right adrenal nodule, uterine fibroids CTs 04/26/2020-mild enlargement of pulmonary nodules, slight increase in trace pelvic fluid, new soft tissue thickening inferior to the cecal tip suspicious for peritoneal metastasis CT 05/26/2020-improved appearance of soft tissue at the inferior tip of the cecum, mildly  thickened short appendix-findings suggestive of resolving appendicitis, stable small bibasilar pulmonary nodules, fibroids Plan biopsy of right cecal tip soft  tissue canceled secondary to radiologic improvement CT chest 08/02/2020-enlargement and progressive cavitation of multiple bilateral lung nodules.  Some new nodules are present. CTs 10/24/2020- increase in size of pulmonary nodules, no new nodules, no evidence of metastatic disease in the abdomen, stable right adrenal nodule CT 01/09/2021-slight interval enlargement of pulmonary nodules, stable right adrenal nodule Navigation bronchoscopy 01/30/2021-left lower lobe cavitary nodule FNA-adenocarcinoma, brushing-adenocarcinoma.  Left lower lobe lavage-adenocarcinoma.  Right upper lobe brushing and FNA biopsy of cavitary nodule-adenocarcinoma-immunohistochemical profile consistent with pancreas adenocarcinoma Cycle 1 gemcitabine/Abraxane 03/28/2021 Cycle 2 gemcitabine/Abraxane 04/11/2021 Cycle 3 gemcitabine/Abraxane 04/25/2021 Cycle 4 gemcitabine/Abraxane 05/09/2021 Cycle 5 gemcitabine/Abraxane 05/23/2021 CT chest 06/05/2021-interval cavitation of multiple pulmonary nodules, some nodules have decreased in size, no new or enlarging nodules Cycle 6 gemcitabine/Abraxane 06/06/2021 Cycle 7 gemcitabine/Abraxane 06/21/2021 Cycle 8 gemcitabine/Abraxane 07/05/2021 Cycle 9 Gemcitabine/Abraxane 07/19/2021 Cycle 10 gemcitabine 08/01/2021-Abraxane held secondary to neuropathy CT chest 08/13/2021-mild decrease in size and wall thickness of multiple cavitary nodules, no new or progressive disease in the chest, indeterminate low-attenuation right liver lesions Cycle 11 gemcitabine 08/16/2021-Abraxane held secondary to neuropathy Cycle 12 gemcitabine 08/30/2021-Abraxane held secondary to neuropathy Cycle 13 gemcitabine 09/13/2021-Abraxane held secondary to neuropathy Cycle 14 gemcitabine 09/27/2021-Abraxane held secondary to neuropathy Cycle 15 gemcitabine 10/11/2021-Abraxane  held secondary to neuropathy CTs 10/22/2021-no change in multiple cavitary lung nodules, no evidence of disease progression, ill-defined hypodense lesion in the posterior right liver suspicious for metastatic disease Cycle 16 gemcitabine 10/25/2021 Cycle 17 gemcitabine 11/08/2021 Cycle 18 Gemcitabine 11/22/2021 Cycle 19 gemcitabine 12/06/2021 Cycle 20 Gemcitabine 12/20/2021 CT 12/31/2021-mild increase in size of bilateral pulmonary metastases, stable subtle continuation right liver lesions Cycle 20 gemcitabine/Abraxane 01/03/2022 Cycle 21 gemcitabine/Abraxane 01/17/2022 Cycle 22 gemcitabine/Abraxane 01/31/2022 Cycle 23 gemcitabine/Abraxane 02/14/2022 Cycle 24 gemcitabine/Abraxane 02/28/2022 CTs 03/11/2022-widespread metastatic disease to the lungs again noted with slight involution of several of the pulmonary nodules, no definite new nodules noted; interval cavitation of solid lesion in the posterior aspect right lobe of the liver, no new liver lesions noted. Cycle 25 Gemcitabine/Abraxane 03/14/2022 Cycle 26 gemcitabine/Abraxane 03/28/2022 Cycle 27 gemcitabine/Abraxane 04/25/2022 Cycle 28 gemcitabine/Abraxane 05/09/2022 Cycle 29 Gemcitabine 05/23/2022, Abraxane held due to neuropathy CTs 06/04/2022-stable multifocal cavitary pulmonary nodules.  No definite new nodules.  No new nodules greater than a centimeter.  Decreased size of the right posterior hepatic lobe lesion.  Generalized heterogeneity and new focal areas of hypodensity in the liver. Cycle 30 Gemcitabine 06/06/2022, Abraxane held due to neuropathy Cycle 31 gemcitabine 06/20/2022, Abraxane held due to neuropathy Cycle 32 gemcitabine 07/04/2022, Abraxane held due to neuropathy Cycle 33 gemcitabine  07/18/2022 ,Abraxane held due to neuropathy Cycle 34 gemcitabine 08/15/2022, Abraxane held due to neuropathy Cycle 35 gemcitabine 08/29/2022, Abraxane held due to neuropathy CTs 09/10/2022-enlarging pulmonary metastases.  Subtle poorly defined hypoattenuating  lesions in the liver, less well-seen than on 06/04/2022.  Suspected new lesion in the dome of the right hepatic lobe Cycle 36 gemcitabine/Abraxane 09/12/2022 Cycle 37 gemcitabine/Abraxane 09/26/2022 Cycle 38 gemcitabine/Abraxane 10/10/2022 Cycle 39 gemcitabine/Abraxane 11/07/2022 Cycle 40 gemcitabine/Abraxane 11/21/2022 Cycle 41 gemcitabine/Abraxane 12/05/2022 Cycle 42 gemcitabine/Abraxane 12/19/2022 CT is 12/30/2022-new left hepatic lesion, stable bilateral pulmonary metastases Cycle 43 gemcitabine/Abraxane 01/02/2023 CT-guided biopsy of liver lesion 01/27/2023-acute/chronic inflammation, negative for malignancy, culture negative Cycle 44 gemcitabine/Abraxane 01/30/2023 Cycle 45 gemcitabine/Abraxane 02/13/2023 Cycle 46 gemcitabine/Abraxane 02/27/2023 Cycle 47 gemcitabine/Abraxane 03/13/2023 Cycle 48 gemcitabine/Abraxane 03/27/2023 CTs 04/21/2023-numerous solid and cavitary lung nodules overall similar, some slightly enlarged; multiple new and enlarging rim-enhancing liver lesions. Cycle 1 FOLFIRI with liposomal irinotecan  Disposition: [name] stable.  Alyssa Ross is scheduled  to begin treatment today with FOLFIRI with liposomal irinotecan.  We again reviewed potential side effects.  Alyssa Ross agrees to proceed.  Alyssa Ross was provided with instruction on how to take Imodium for late phase diarrhea.  Plan to proceed with cycle 1 FOLFIRI with liposomal irinotecan as scheduled.  CBC and chemistry panel reviewed.  Labs adequate to proceed as above.  Alyssa Ross will return for follow-up and treatment 06/05/2023.  We are available to see her sooner if needed.    Alyssa Ross ANP/GNP-BC   05/21/2023  10:33 AM

## 2023-05-21 NOTE — Progress Notes (Signed)
Patient seen by Lonna Cobb NP today  Vitals are within treatment parameters:Yes   Labs are within treatment parameters: Yes The results from the magnesium lab are not yet available. Treatment plan has been signed: Yes   Per physician team, Patient is ready for treatment and there are NO modifications to the treatment plan.

## 2023-05-22 ENCOUNTER — Inpatient Hospital Stay: Payer: BC Managed Care – PPO

## 2023-05-22 ENCOUNTER — Inpatient Hospital Stay: Payer: BC Managed Care – PPO | Admitting: Nurse Practitioner

## 2023-05-22 ENCOUNTER — Other Ambulatory Visit: Payer: Self-pay

## 2023-05-23 ENCOUNTER — Inpatient Hospital Stay: Payer: BC Managed Care – PPO

## 2023-05-23 ENCOUNTER — Telehealth: Payer: Self-pay

## 2023-05-23 VITALS — BP 105/68 | HR 71 | Temp 98.2°F | Resp 17

## 2023-05-23 DIAGNOSIS — C7802 Secondary malignant neoplasm of left lung: Secondary | ICD-10-CM | POA: Diagnosis not present

## 2023-05-23 DIAGNOSIS — C250 Malignant neoplasm of head of pancreas: Secondary | ICD-10-CM | POA: Diagnosis not present

## 2023-05-23 DIAGNOSIS — C3411 Malignant neoplasm of upper lobe, right bronchus or lung: Secondary | ICD-10-CM | POA: Diagnosis not present

## 2023-05-23 DIAGNOSIS — C7801 Secondary malignant neoplasm of right lung: Secondary | ICD-10-CM | POA: Diagnosis not present

## 2023-05-23 DIAGNOSIS — Z5111 Encounter for antineoplastic chemotherapy: Secondary | ICD-10-CM | POA: Diagnosis not present

## 2023-05-23 DIAGNOSIS — E1140 Type 2 diabetes mellitus with diabetic neuropathy, unspecified: Secondary | ICD-10-CM | POA: Diagnosis not present

## 2023-05-23 DIAGNOSIS — I10 Essential (primary) hypertension: Secondary | ICD-10-CM | POA: Diagnosis not present

## 2023-05-23 DIAGNOSIS — Z79899 Other long term (current) drug therapy: Secondary | ICD-10-CM | POA: Diagnosis not present

## 2023-05-23 LAB — CANCER ANTIGEN 19-9: CA 19-9: 260 U/mL — ABNORMAL HIGH (ref 0–35)

## 2023-05-23 MED ORDER — HEPARIN SOD (PORK) LOCK FLUSH 100 UNIT/ML IV SOLN
500.0000 [IU] | Freq: Once | INTRAVENOUS | Status: AC | PRN
  Administered 2023-05-23: 500 [IU]

## 2023-05-23 MED ORDER — SODIUM CHLORIDE 0.9% FLUSH
10.0000 mL | INTRAVENOUS | Status: DC | PRN
  Administered 2023-05-23: 10 mL

## 2023-05-23 NOTE — Patient Instructions (Signed)

## 2023-05-23 NOTE — Telephone Encounter (Signed)
FIRST TIME CHEMO FOLLOW-UP: Patient in infusion room for chemo pump removal.  Patient stated she tolerated treatment well with minor bouts of nausea.  Patient stated the nausea was not enough to make her need her home nausea medications.  Patient instructed to continue to hydrate and to take home nausea meds as needed.  Patient verbalized understanding.

## 2023-06-01 ENCOUNTER — Other Ambulatory Visit: Payer: Self-pay | Admitting: Oncology

## 2023-06-02 DIAGNOSIS — N939 Abnormal uterine and vaginal bleeding, unspecified: Secondary | ICD-10-CM | POA: Diagnosis not present

## 2023-06-02 DIAGNOSIS — Z01419 Encounter for gynecological examination (general) (routine) without abnormal findings: Secondary | ICD-10-CM | POA: Diagnosis not present

## 2023-06-02 DIAGNOSIS — Z1211 Encounter for screening for malignant neoplasm of colon: Secondary | ICD-10-CM | POA: Diagnosis not present

## 2023-06-05 ENCOUNTER — Inpatient Hospital Stay: Payer: BC Managed Care – PPO | Admitting: Nurse Practitioner

## 2023-06-05 ENCOUNTER — Other Ambulatory Visit: Payer: Self-pay

## 2023-06-05 ENCOUNTER — Encounter: Payer: Self-pay | Admitting: Nurse Practitioner

## 2023-06-05 ENCOUNTER — Inpatient Hospital Stay: Payer: BC Managed Care – PPO

## 2023-06-05 ENCOUNTER — Telehealth: Payer: Self-pay

## 2023-06-05 VITALS — BP 108/67 | HR 93 | Temp 98.1°F | Resp 18 | Ht 68.0 in | Wt 175.8 lb

## 2023-06-05 DIAGNOSIS — D649 Anemia, unspecified: Secondary | ICD-10-CM

## 2023-06-05 DIAGNOSIS — E876 Hypokalemia: Secondary | ICD-10-CM

## 2023-06-05 DIAGNOSIS — I82622 Acute embolism and thrombosis of deep veins of left upper extremity: Secondary | ICD-10-CM

## 2023-06-05 DIAGNOSIS — C7801 Secondary malignant neoplasm of right lung: Secondary | ICD-10-CM | POA: Diagnosis not present

## 2023-06-05 DIAGNOSIS — Z79899 Other long term (current) drug therapy: Secondary | ICD-10-CM | POA: Diagnosis not present

## 2023-06-05 DIAGNOSIS — Z95828 Presence of other vascular implants and grafts: Secondary | ICD-10-CM

## 2023-06-05 DIAGNOSIS — Z5111 Encounter for antineoplastic chemotherapy: Secondary | ICD-10-CM | POA: Diagnosis not present

## 2023-06-05 DIAGNOSIS — E1140 Type 2 diabetes mellitus with diabetic neuropathy, unspecified: Secondary | ICD-10-CM | POA: Diagnosis not present

## 2023-06-05 DIAGNOSIS — C250 Malignant neoplasm of head of pancreas: Secondary | ICD-10-CM

## 2023-06-05 DIAGNOSIS — I10 Essential (primary) hypertension: Secondary | ICD-10-CM | POA: Diagnosis not present

## 2023-06-05 DIAGNOSIS — C7802 Secondary malignant neoplasm of left lung: Secondary | ICD-10-CM | POA: Diagnosis not present

## 2023-06-05 DIAGNOSIS — C3411 Malignant neoplasm of upper lobe, right bronchus or lung: Secondary | ICD-10-CM | POA: Diagnosis not present

## 2023-06-05 LAB — CMP (CANCER CENTER ONLY)
ALT: 8 U/L (ref 0–44)
AST: 9 U/L — ABNORMAL LOW (ref 15–41)
Albumin: 2.8 g/dL — ABNORMAL LOW (ref 3.5–5.0)
Alkaline Phosphatase: 154 U/L — ABNORMAL HIGH (ref 38–126)
Anion gap: 6 (ref 5–15)
BUN: 10 mg/dL (ref 6–20)
CO2: 32 mmol/L (ref 22–32)
Calcium: 7.9 mg/dL — ABNORMAL LOW (ref 8.9–10.3)
Chloride: 99 mmol/L (ref 98–111)
Creatinine: 0.95 mg/dL (ref 0.44–1.00)
GFR, Estimated: >60 mL/min (ref >60)
Glucose, Bld: 196 mg/dL — ABNORMAL HIGH (ref 70–99)
Potassium: 2.4 mmol/L — CL (ref 3.5–5.1)
Sodium: 137 mmol/L (ref 135–145)
Total Bilirubin: 0.6 mg/dL (ref <1.2)
Total Protein: 6.9 g/dL (ref 6.5–8.1)

## 2023-06-05 LAB — CBC WITH DIFFERENTIAL (CANCER CENTER ONLY)
Abs Immature Granulocytes: 0.02 10*3/uL (ref 0.00–0.07)
Basophils Absolute: 0 10*3/uL (ref 0.0–0.1)
Basophils Relative: 0 %
Eosinophils Absolute: 0.1 10*3/uL (ref 0.0–0.5)
Eosinophils Relative: 2 %
HCT: 22.7 % — ABNORMAL LOW (ref 36.0–46.0)
Hemoglobin: 7.7 g/dL — ABNORMAL LOW (ref 12.0–15.0)
Immature Granulocytes: 1 %
Lymphocytes Relative: 26 %
Lymphs Abs: 0.7 10*3/uL (ref 0.7–4.0)
MCH: 29.4 pg (ref 26.0–34.0)
MCHC: 33.9 g/dL (ref 30.0–36.0)
MCV: 86.6 fL (ref 80.0–100.0)
Monocytes Absolute: 0.6 10*3/uL (ref 0.1–1.0)
Monocytes Relative: 21 %
Neutro Abs: 1.3 10*3/uL — ABNORMAL LOW (ref 1.7–7.7)
Neutrophils Relative %: 50 %
Platelet Count: 426 10*3/uL — ABNORMAL HIGH (ref 150–400)
RBC: 2.62 MIL/uL — ABNORMAL LOW (ref 3.87–5.11)
RDW: 15.8 % — ABNORMAL HIGH (ref 11.5–15.5)
WBC Count: 2.6 10*3/uL — ABNORMAL LOW (ref 4.0–10.5)
nRBC: 0 % (ref 0.0–0.2)

## 2023-06-05 LAB — MAGNESIUM: Magnesium: 1.6 mg/dL — ABNORMAL LOW (ref 1.7–2.4)

## 2023-06-05 LAB — PREPARE RBC (CROSSMATCH)

## 2023-06-05 MED ORDER — SODIUM CHLORIDE 0.9 % IV SOLN
Freq: Once | INTRAVENOUS | Status: AC
Start: 2023-06-05 — End: 2023-06-05

## 2023-06-05 MED ORDER — SODIUM CHLORIDE 0.9% FLUSH
10.0000 mL | Freq: Once | INTRAVENOUS | Status: AC
  Administered 2023-06-05: 10 mL via INTRAVENOUS

## 2023-06-05 MED ORDER — HEPARIN SOD (PORK) LOCK FLUSH 100 UNIT/ML IV SOLN
500.0000 [IU] | Freq: Once | INTRAVENOUS | Status: AC
  Administered 2023-06-05: 500 [IU] via INTRAVENOUS

## 2023-06-05 MED ORDER — POTASSIUM CHLORIDE 10 MEQ/100ML IV SOLN
10.0000 meq | INTRAVENOUS | Status: AC
Start: 2023-06-05 — End: 2023-06-05
  Administered 2023-06-05 (×4): 10 meq via INTRAVENOUS
  Filled 2023-06-05: qty 100

## 2023-06-05 MED ORDER — MAGNESIUM SULFATE 2 GM/50ML IV SOLN
2.0000 g | Freq: Once | INTRAVENOUS | Status: AC
  Administered 2023-06-05: 2 g via INTRAVENOUS
  Filled 2023-06-05: qty 50

## 2023-06-05 MED ORDER — APIXABAN 5 MG PO TABS: 5.0000 mg | ORAL_TABLET | Freq: Two times a day (BID) | ORAL | 0 refills | Status: DC

## 2023-06-05 MED ORDER — SODIUM CHLORIDE 0.9 % IV SOLN: Freq: Once | INTRAVENOUS | Status: AC

## 2023-06-05 NOTE — Progress Notes (Signed)
[facility] OFFICE PROGRESS NOTE   Diagnosis: Pancreas cancer  INTERVAL HISTORY:   [name] returns as scheduled.  She completed cycle 1 FOLFIRI with liposomal irinotecan 05/21/2023.  She had a few episodes of mild nausea.  No vomiting.  No mouth sores.  She had loose stools yesterday x 3.  She took Imodium with good results.  Appetite has been diminished the past few days.  No abdominal pain.  She has had a dry cough for the past 4 to 5 days.  Mild sinus drainage.  No shortness of breath.  No fever.  She is not taking oral potassium or magnesium consistently.  Objective:  Vital signs in last 24 hours:  Blood pressure 108/67, pulse 93, temperature 98.1 F (36.7 C), temperature source Temporal, resp. rate 18, height 5\' 8"  (1.727 m), weight 175 lb 12.8 oz (79.7 kg), last menstrual period 09/17/2012, SpO2 100%.    HEENT: No thrush or ulcers. Resp: Lungs clear bilaterally. Cardio: Regular rate and rhythm. GI: No hepatosplenomegaly. Vascular: No leg edema. Port-A-Cath without erythema.  Lab Results:  Lab Results  Component Value Date   WBC 2.6 (L) 06/05/2023   HGB 7.7 (L) 06/05/2023   HCT 22.7 (L) 06/05/2023   MCV 86.6 06/05/2023   PLT 426 (H) 06/05/2023   NEUTROABS 1.3 (L) 06/05/2023    Imaging:  No results found.  Medications: I have reviewed the patient's current medications.  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 Tumor invades the duodenal wall and vascular groove, resection margins negative, 4/34 lymph nodes positive MSI-stable, tumor showed instability in 2 loci as did adjacent normal tissue Foundation 1-K-ras G12 V, microsatellite status and tumor mutation burden cannot be determined EUS FNA biopsy of pancreas mass on 07/03/2018-well-differentiated neuroendocrine tumor CTs 01/29/2019-ill-defined pancreas head mass, 5 pulmonary nodules-1 with a small amount of central cavitation, tumor  abuts the left margin of the portal vein indistinct density surrounding, hepatic artery, complex cystic lesion of the right kidney, right adrenal mass-characterized as an adenoma on a [facility] MRI 12/21/2018 Netspot 03/03/2019-no focal pancreas activity, no tracer accumulation within the suspicious pulmonary nodules, left uterine mass with tracer accumulation felt to represent a leiomyoma Elevated preoperative CA 19-9--CA 19-9 186 on 01/18/2019 CT chest 04/16/2019-multiple bilateral pulmonary nodules, some with increased cavitation, stable in size Cycle 1 FOLFIRINOX 04/27/2019 Cycle 2 FOLFIRINOX 05/11/2019 Cycle 3 FOLFIRINOX 05/23/2019 Cycle 4 FOLFIRINOX 06/08/2019 Cycle 5 FOLFIRINOX 06/22/2019 CT chest 07/02/2019-stable size of bilateral pulmonary nodules.  Dominant cavitary lesions in both lungs show increased cavitation with thinner walls.  Stable 2.1 cm right adrenal nodule. 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 No definite evidence of metastatic disease in the abdomen or pelvis. Markedly enlarged and heterogeneous appearing uterus, likely to represent multifocal fibroids. 1 of these lesions appears to be an exophytic subserosal fibroid in the posterior lateral aspect of the uterine body on the left side although this comes in close proximity to the left adnexa such that a primary ovarian lesion is difficult to completely exclude. CTs 02/07/2020-slight enlargement of bilateral lung nodules, some are cavitary, no evidence of metastatic disease in the abdomen or pelvis, stable right adrenal  nodule, uterine fibroids CTs 04/26/2020-mild enlargement of pulmonary nodules, slight increase in trace pelvic fluid, new soft tissue thickening inferior to the cecal tip suspicious for peritoneal metastasis CT 05/26/2020-improved appearance of soft tissue at the inferior tip of the cecum, mildly thickened short appendix-findings suggestive of resolving appendicitis, stable small bibasilar pulmonary nodules, fibroids Plan biopsy of right cecal tip soft tissue canceled secondary to radiologic improvement CT chest 08/02/2020-enlargement and progressive cavitation of multiple bilateral lung nodules.  Some new nodules are present. CTs 10/24/2020- increase in size of pulmonary nodules, no new nodules, no evidence of metastatic disease in the abdomen, stable right adrenal nodule CT 01/09/2021-slight interval enlargement of pulmonary nodules, stable right adrenal nodule Navigation bronchoscopy 01/30/2021-left lower lobe cavitary nodule FNA-adenocarcinoma, brushing-adenocarcinoma.  Left lower lobe lavage-adenocarcinoma.  Right upper lobe brushing and FNA biopsy of cavitary nodule-adenocarcinoma-immunohistochemical profile consistent with pancreas adenocarcinoma Cycle 1 gemcitabine/Abraxane 03/28/2021 Cycle 2 gemcitabine/Abraxane 04/11/2021 Cycle 3 gemcitabine/Abraxane 04/25/2021 Cycle 4 gemcitabine/Abraxane 05/09/2021 Cycle 5 gemcitabine/Abraxane 05/23/2021 CT chest 06/05/2021-interval cavitation of multiple pulmonary nodules, some nodules have decreased in size, no new or enlarging nodules Cycle 6 gemcitabine/Abraxane 06/06/2021 Cycle 7 gemcitabine/Abraxane 06/21/2021 Cycle 8 gemcitabine/Abraxane 07/05/2021 Cycle 9 Gemcitabine/Abraxane 07/19/2021 Cycle 10 gemcitabine 08/01/2021-Abraxane held secondary to neuropathy CT chest 08/13/2021-mild decrease in size and wall thickness of multiple cavitary nodules, no new or progressive disease in the chest, indeterminate low-attenuation right liver lesions Cycle 11  gemcitabine 08/16/2021-Abraxane held secondary to neuropathy Cycle 12 gemcitabine 08/30/2021-Abraxane held secondary to neuropathy Cycle 13 gemcitabine 09/13/2021-Abraxane held secondary to neuropathy Cycle 14 gemcitabine 09/27/2021-Abraxane held secondary to neuropathy Cycle 15 gemcitabine 10/11/2021-Abraxane held secondary to neuropathy CTs 10/22/2021-no change in multiple cavitary lung nodules, no evidence of disease progression, ill-defined hypodense lesion in the posterior right liver suspicious for metastatic disease Cycle 16 gemcitabine 10/25/2021 Cycle 17 gemcitabine 11/08/2021 Cycle 18 Gemcitabine 11/22/2021 Cycle 19 gemcitabine 12/06/2021 Cycle 20 Gemcitabine 12/20/2021 CT 12/31/2021-mild increase in size of bilateral pulmonary metastases, stable subtle continuation right liver lesions Cycle 20 gemcitabine/Abraxane 01/03/2022 Cycle 21 gemcitabine/Abraxane 01/17/2022 Cycle 22 gemcitabine/Abraxane 01/31/2022 Cycle 23 gemcitabine/Abraxane 02/14/2022 Cycle 24 gemcitabine/Abraxane 02/28/2022 CTs 03/11/2022-widespread metastatic disease to the lungs again noted with slight involution of several of the pulmonary nodules, no definite new nodules noted; interval cavitation of solid lesion in the posterior aspect right lobe of the liver, no new liver lesions noted. Cycle 25 Gemcitabine/Abraxane 03/14/2022 Cycle 26 gemcitabine/Abraxane 03/28/2022 Cycle 27 gemcitabine/Abraxane 04/25/2022 Cycle 28 gemcitabine/Abraxane 05/09/2022 Cycle 29 Gemcitabine 05/23/2022, Abraxane held due to neuropathy CTs 06/04/2022-stable multifocal cavitary pulmonary nodules.  No definite new nodules.  No new nodules greater than a centimeter.  Decreased size of the right posterior hepatic lobe lesion.  Generalized heterogeneity and new focal areas of hypodensity in the liver. Cycle 30 Gemcitabine 06/06/2022, Abraxane held due to neuropathy Cycle 31 gemcitabine 06/20/2022, Abraxane held due to neuropathy Cycle 32 gemcitabine 07/04/2022,  Abraxane held due to neuropathy Cycle 33 gemcitabine  07/18/2022 ,Abraxane held due to neuropathy Cycle 34 gemcitabine 08/15/2022, Abraxane held due to neuropathy Cycle 35 gemcitabine 08/29/2022, Abraxane held due to neuropathy CTs 09/10/2022-enlarging pulmonary metastases.  Subtle poorly defined hypoattenuating lesions in the liver, less well-seen than on 06/04/2022.  Suspected new lesion in the dome of the right hepatic lobe Cycle 36 gemcitabine/Abraxane 09/12/2022 Cycle 37 gemcitabine/Abraxane 09/26/2022 Cycle 38 gemcitabine/Abraxane 10/10/2022 Cycle 39 gemcitabine/Abraxane 11/07/2022 Cycle 40 gemcitabine/Abraxane 11/21/2022 Cycle 41 gemcitabine/Abraxane 12/05/2022 Cycle 42 gemcitabine/Abraxane 12/19/2022 CT is 12/30/2022-new left hepatic lesion, stable bilateral pulmonary metastases Cycle 43 gemcitabine/Abraxane 01/02/2023 CT-guided biopsy of liver lesion  01/27/2023-acute/chronic inflammation, negative for malignancy, culture negative Cycle 44 gemcitabine/Abraxane 01/30/2023 Cycle 45 gemcitabine/Abraxane 02/13/2023 Cycle 46 gemcitabine/Abraxane 02/27/2023 Cycle 47 gemcitabine/Abraxane 03/13/2023 Cycle 48 gemcitabine/Abraxane 03/27/2023 CTs 04/21/2023-numerous solid and cavitary lung nodules overall similar, some slightly enlarged; multiple new and enlarging rim-enhancing liver lesions. 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  Apixaban 10/24/2022      Disposition: Alyssa Ross appears stable.  She has completed 1 cycle of FOLFIRI with liposomal irinotecan.  She tolerated without significant acute toxicity.  CBC from today shows mild neutropenia and progressive anemia.  We are holding today's treatment.  I discussed white cell growth factor support with the next cycle.  We reviewed potential side effects including bone pain, rash, splenic rupture.  We discussed returning in 1 week for the next cycle of chemotherapy.  She prefers to return in 2 weeks.  She understands to contact the office with fever, chills, other signs of infection.  We are making arrangements for a blood transfusion.  She has significant hypokalemia, mild hypomagnesemia.  She confirms she is not taking oral potassium and magnesium as prescribed.  She will receive IV potassium and magnesium today and agrees to resume home meds.  She will return for follow-up and treatment in 2 weeks.  We are available to see her sooner if needed.  Lonna Cobb ANP/GNP-BC   06/05/2023  9:25 AM

## 2023-06-05 NOTE — Telephone Encounter (Signed)
CRITICAL VALUE STICKER  CRITICAL VALUE: Potassium 2.4   RECEIVER (on-site recipient of call): Darl Pikes RN  DATE & TIME NOTIFIED: 06/05/23 [id number]  MESSENGER (representative from lab): Maurine Minister  MD NOTIFIED: Lonna Cobb NP   TIME OF NOTIFICATION: [phone]   RESPONSE: no chemo today; IV potassium 40 meq, 2 grams IV mag, 1 unit of blood

## 2023-06-05 NOTE — Patient Instructions (Signed)
Irinotecan Liposome Injection What is this medication? IRINOTECAN LIPOSOME (eye ri noe TEE kan LIP oh som) treats pancreatic cancer. It works by slowing down the growth of cancer cells. This medicine may be used for other purposes; ask your health care provider or pharmacist if you have questions. COMMON BRAND NAME(S): ONIVYDE What should I tell my care team before I take this medication? They need to know if you have any of these conditions: Blockage in your bowels Dehydration Infection Low white blood cell levels Lung disease An unusual or allergic reaction to irinotecan liposome, irinotecan, other medications, foods, dyes, or preservatives Pregnant or trying to get pregnant Breast-feeding How should I use this medication? This medication is injected into a vein. It is given by your care team in a hospital or clinic setting. Talk to your care team about the use of this medication in children. Special care may be needed. Overdosage: If you think you have taken too much of this medicine contact a poison control center or emergency room at once. NOTE: This medicine is only for you. Do not share this medicine with others. What if I miss a dose? Keep appointments for follow-up doses. It is important not to miss your dose. Call your care team if you are unable to keep an appointment. What may interact with this medication? Do not take this medication with any of the following: Itraconazole This medication may also interact with the following: Certain antivirals for HIV or AIDS Certain medications for seizures, such as carbamazepine, fosphenytoin, phenytoin, phenobarbital Clarithromycin Gemfibrozil Nefazodone Rifabutin Rifampin Rifapentine St. John's Wort Voriconazole This list may not describe all possible interactions. Give your health care provider a list of all the medicines, herbs, non-prescription drugs, or dietary supplements you use. Also tell them if you smoke, drink alcohol,  or use illegal drugs. Some items may interact with your medicine. What should I watch for while using this medication? This medication may make you feel generally unwell. This is not uncommon as chemotherapy can affect healthy cells as well as cancer cells. Report any side effects. Continue your course of treatment even though you feel ill unless your care team tells you to stop. You may need blood work while you are taking this medication. This medication can cause serious side effects and allergic reactions. To reduce your risk, your care team may give you other medications to take before receiving this one. Be sure to follow the directions from your care team. Check with your care team if you get an attack of diarrhea, nausea and vomiting, or if you sweat a lot. The loss of too much body fluid can make it dangerous for you to take this medication. This medication may cause infertility. Talk to your care team if you are concerned about your fertility. Talk to your care team if you wish to become pregnant or if you think you might be pregnant. This medication can cause serious birth defects if taken during pregnancy or if you get pregnant within 7 months after stopping therapy. A negative pregnancy test is required before starting this medication. A reliable form of contraception is recommended while taking this medication and for 7 months after stopping it. Talk to your care team about reliable forms of contraception. Use a condom during sex and for 4 months after stopping therapy. Tell your care team right away if you think your partner might be pregnant. This medication can cause serious birth defects. Do not breast-feed while taking this medication and for 1  month after stopping therapy. This medication may increase your risk of getting an infection. Call your care team for advice if you get a fever, chills, sore throat, or other symptoms of a cold or flu. Do not treat yourself. Try to avoid being  around people who are sick. Avoid taking medications that contain aspirin, acetaminophen, ibuprofen, naproxen, or ketoprofen unless instructed by your care team. These medications may hide a fever. Be careful brushing or flossing your teeth or using a toothpick because you may get an infection or bleed more easily. If you have any dental work done, tell your dentist you are receiving this medication. What side effects may I notice from receiving this medication? Side effects that you should report to your care team as soon as possible: Allergic reactions or angioedema--skin rash, itching or hives, swelling of the face, eyes, lips, tongue, arms, or legs, trouble swallowing or breathing Dry cough, shortness of breath or trouble breathing Diarrhea Infection--fever, chills, cough, or sore throat Side effects that usually do not require medical attention (report to your care team if they continue or are bothersome): Fatigue Loss of appetite Nausea Pain, redness, or swelling with sores inside the mouth or throat Vomiting This list may not describe all possible side effects. Call your doctor for medical advice about side effects. You may report side effects to FDA at 1-800-FDA-1088. Where should I keep my medication? This medication is given in a hospital or clinic. It will not be stored at home. NOTE: This sheet is a summary. It may not cover all possible information. If you have questions about this medicine, talk to your doctor, pharmacist, or health care provider.  2024 Elsevier/Gold Standard (2021-07-26 00:00:00) Leucovorin Injection What is this medication? LEUCOVORIN (loo koe VOR in) prevents side effects from certain medications, such as methotrexate. It works by increasing folate levels. This helps protect healthy cells in your body. It may also be used to treat anemia caused by low levels of folate. It can also be used with fluorouracil, a type of chemotherapy, to treat colorectal cancer.  It works by increasing the effects of fluorouracil in the body. This medicine may be used for other purposes; ask your health care provider or pharmacist if you have questions. What should I tell my care team before I take this medication? They need to know if you have any of these conditions: Anemia from low levels of vitamin B12 in the blood An unusual or allergic reaction to leucovorin, folic acid, other medications, foods, dyes, or preservatives Pregnant or trying to get pregnant Breastfeeding How should I use this medication? This medication is injected into a vein or a muscle. It is given by your care team in a hospital or clinic setting. Talk to your care team about the use of this medication in children. Special care may be needed. Overdosage: If you think you have taken too much of this medicine contact a poison control center or emergency room at once. NOTE: This medicine is only for you. Do not share this medicine with others. What if I miss a dose? Keep appointments for follow-up doses. It is important not to miss your dose. Call your care team if you are unable to keep an appointment. What may interact with this medication? Capecitabine Fluorouracil Phenobarbital Phenytoin Primidone Trimethoprim;sulfamethoxazole This list may not describe all possible interactions. Give your health care provider a list of all the medicines, herbs, non-prescription drugs, or dietary supplements you use. Also tell them if you smoke, drink  alcohol, or use illegal drugs. Some items may interact with your medicine. What should I watch for while using this medication? Your condition will be monitored carefully while you are receiving this medication. This medication may increase the side effects of 5-fluorouracil. Tell your care team if you have diarrhea or mouth sores that do not get better or that get worse. What side effects may I notice from receiving this medication? Side effects that you  should report to your care team as soon as possible: Allergic reactions--skin rash, itching, hives, swelling of the face, lips, tongue, or throat This list may not describe all possible side effects. Call your doctor for medical advice about side effects. You may report side effects to FDA at 1-800-FDA-1088. Where should I keep my medication? This medication is given in a hospital or clinic. It will not be stored at home. NOTE: This sheet is a summary. It may not cover all possible information. If you have questions about this medicine, talk to your doctor, pharmacist, or health care provider.  2024 Elsevier/Gold Standard (2021-10-30 00:00:00) Fluorouracil Injection What is this medication? FLUOROURACIL (flure oh YOOR a sil) treats some types of cancer. It works by slowing down the growth of cancer cells. This medicine may be used for other purposes; ask your health care provider or pharmacist if you have questions. COMMON BRAND NAME(S): Adrucil What should I tell my care team before I take this medication? They need to know if you have any of these conditions: Blood disorders Dihydropyrimidine dehydrogenase (DPD) deficiency Infection, such as chickenpox, cold sores, herpes Kidney disease Liver disease Poor nutrition Recent or ongoing radiation therapy An unusual or allergic reaction to fluorouracil, other medications, foods, dyes, or preservatives If you or your partner are pregnant or trying to get pregnant Breast-feeding How should I use this medication? This medication is injected into a vein. It is administered by your care team in a hospital or clinic setting. Talk to your care team about the use of this medication in children. Special care may be needed. Overdosage: If you think you have taken too much of this medicine contact a poison control center or emergency room at once. NOTE: This medicine is only for you. Do not share this medicine with others. What if I miss a  dose? Keep appointments for follow-up doses. It is important not to miss your dose. Call your care team if you are unable to keep an appointment. What may interact with this medication? Do not take this medication with any of the following: Live virus vaccines This medication may also interact with the following: Medications that treat or prevent blood clots, such as warfarin, enoxaparin, dalteparin This list may not describe all possible interactions. Give your health care provider a list of all the medicines, herbs, non-prescription drugs, or dietary supplements you use. Also tell them if you smoke, drink alcohol, or use illegal drugs. Some items may interact with your medicine. What should I watch for while using this medication? Your condition will be monitored carefully while you are receiving this medication. This medication may make you feel generally unwell. This is not uncommon as chemotherapy can affect healthy cells as well as cancer cells. Report any side effects. Continue your course of treatment even though you feel ill unless your care team tells you to stop. In some cases, you may be given additional medications to help with side effects. Follow all directions for their use. This medication may increase your risk of getting an infection.  Call your care team for advice if you get a fever, chills, sore throat, or other symptoms of a cold or flu. Do not treat yourself. Try to avoid being around people who are sick. This medication may increase your risk to bruise or bleed. Call your care team if you notice any unusual bleeding. Be careful brushing or flossing your teeth or using a toothpick because you may get an infection or bleed more easily. If you have any dental work done, tell your dentist you are receiving this medication. Avoid taking medications that contain aspirin, acetaminophen, ibuprofen, naproxen, or ketoprofen unless instructed by your care team. These medications may hide  a fever. Do not treat diarrhea with over the counter products. Contact your care team if you have diarrhea that lasts more than 2 days or if it is severe and watery. This medication can make you more sensitive to the sun. Keep out of the sun. If you cannot avoid being in the sun, wear protective clothing and sunscreen. Do not use sun lamps, tanning beds, or tanning booths. Talk to your care team if you or your partner wish to become pregnant or think you might be pregnant. This medication can cause serious birth defects if taken during pregnancy and for 3 months after the last dose. A reliable form of contraception is recommended while taking this medication and for 3 months after the last dose. Talk to your care team about effective forms of contraception. Do not father a child while taking this medication and for 3 months after the last dose. Use a condom while having sex during this time period. Do not breastfeed while taking this medication. This medication may cause infertility. Talk to your care team if you are concerned about your fertility. What side effects may I notice from receiving this medication? Side effects that you should report to your care team as soon as possible: Allergic reactions--skin rash, itching, hives, swelling of the face, lips, tongue, or throat Heart attack--pain or tightness in the chest, shoulders, arms, or jaw, nausea, shortness of breath, cold or clammy skin, feeling faint or lightheaded Heart failure--shortness of breath, swelling of the ankles, feet, or hands, sudden weight gain, unusual weakness or fatigue Heart rhythm changes--fast or irregular heartbeat, dizziness, feeling faint or lightheaded, chest pain, trouble breathing High ammonia level--unusual weakness or fatigue, confusion, loss of appetite, nausea, vomiting, seizures Infection--fever, chills, cough, sore throat, wounds that don't heal, pain or trouble when passing urine, general feeling of discomfort or  being unwell Low red blood cell level--unusual weakness or fatigue, dizziness, headache, trouble breathing Pain, tingling, or numbness in the hands or feet, muscle weakness, change in vision, confusion or trouble speaking, loss of balance or coordination, trouble walking, seizures Redness, swelling, and blistering of the skin over hands and feet Severe or prolonged diarrhea Unusual bruising or bleeding Side effects that usually do not require medical attention (report to your care team if they continue or are bothersome): Dry skin Headache Increased tears Nausea Pain, redness, or swelling with sores inside the mouth or throat Sensitivity to light Vomiting This list may not describe all possible side effects. Call your doctor for medical advice about side effects. You may report side effects to FDA at 1-800-FDA-1088. Where should I keep my medication? This medication is given in a hospital or clinic. It will not be stored at home. NOTE: This sheet is a summary. It may not cover all possible information. If you have questions about this medicine, talk to  your doctor, pharmacist, or health care provider.  2024 Elsevier/Gold Standard (2021-10-02 00:00:00)

## 2023-06-05 NOTE — Progress Notes (Addendum)
Patient 1 unit of blood tomorrow at 0930,spoke with Alexia with blood bank and order received. Feliz Beam with [facility] picking up at 0830 to be delivered at 0930. Infusion made aware.

## 2023-06-05 NOTE — Patient Instructions (Signed)

## 2023-06-05 NOTE — Patient Instructions (Signed)
Ensure Clear is carried by most drug & grocery stores. The Boost Cleotis Nipper is only available on line [employer] or [employer])

## 2023-06-06 ENCOUNTER — Other Ambulatory Visit: Payer: Self-pay | Admitting: *Deleted

## 2023-06-06 ENCOUNTER — Encounter: Payer: Self-pay | Admitting: Oncology

## 2023-06-06 ENCOUNTER — Other Ambulatory Visit: Payer: Self-pay | Admitting: Nurse Practitioner

## 2023-06-06 ENCOUNTER — Inpatient Hospital Stay: Payer: BC Managed Care – PPO

## 2023-06-06 DIAGNOSIS — D649 Anemia, unspecified: Secondary | ICD-10-CM

## 2023-06-06 DIAGNOSIS — E1140 Type 2 diabetes mellitus with diabetic neuropathy, unspecified: Secondary | ICD-10-CM | POA: Diagnosis not present

## 2023-06-06 DIAGNOSIS — C250 Malignant neoplasm of head of pancreas: Secondary | ICD-10-CM

## 2023-06-06 DIAGNOSIS — C3411 Malignant neoplasm of upper lobe, right bronchus or lung: Secondary | ICD-10-CM | POA: Diagnosis not present

## 2023-06-06 DIAGNOSIS — R059 Cough, unspecified: Secondary | ICD-10-CM

## 2023-06-06 DIAGNOSIS — C7801 Secondary malignant neoplasm of right lung: Secondary | ICD-10-CM | POA: Diagnosis not present

## 2023-06-06 DIAGNOSIS — Z79899 Other long term (current) drug therapy: Secondary | ICD-10-CM | POA: Diagnosis not present

## 2023-06-06 DIAGNOSIS — Z5111 Encounter for antineoplastic chemotherapy: Secondary | ICD-10-CM | POA: Diagnosis not present

## 2023-06-06 DIAGNOSIS — C7802 Secondary malignant neoplasm of left lung: Secondary | ICD-10-CM | POA: Diagnosis not present

## 2023-06-06 DIAGNOSIS — I10 Essential (primary) hypertension: Secondary | ICD-10-CM | POA: Diagnosis not present

## 2023-06-06 LAB — CANCER ANTIGEN 19-9: CA 19-9: 348 U/mL — ABNORMAL HIGH (ref 0–35)

## 2023-06-06 MED ORDER — HYDROCOD POLI-CHLORPHE POLI ER 10-8 MG/5ML PO SUER: 5.0000 mL | Freq: Two times a day (BID) | ORAL | 0 refills | Status: DC | PRN

## 2023-06-06 MED ORDER — HYDROCOD POLI-CHLORPHE POLI ER 10-8 MG/5ML PO SUER: 2.5000 mL | Freq: Two times a day (BID) | ORAL | 0 refills | Status: DC | PRN

## 2023-06-06 MED ORDER — SODIUM CHLORIDE 0.9% FLUSH
10.0000 mL | INTRAVENOUS | Status: AC | PRN
  Administered 2023-06-06: 10 mL

## 2023-06-06 MED ORDER — HEPARIN SOD (PORK) LOCK FLUSH 100 UNIT/ML IV SOLN
500.0000 [IU] | Freq: Every day | INTRAVENOUS | Status: AC | PRN
  Administered 2023-06-06: 500 [IU]

## 2023-06-06 MED ORDER — SODIUM CHLORIDE 0.9% IV SOLUTION
250.0000 mL | INTRAVENOUS | Status: DC
  Administered 2023-06-06: 100 mL via INTRAVENOUS

## 2023-06-06 NOTE — Patient Instructions (Addendum)
A prescription for chlorpheniramine-HYDROcodone (TUSSIONEX) has been sent to your pharmacy.  Please note, it contains hydrocodone (pain medicine) so sedation can occur, no driving or performing tasks that require alertness. Start with an initial dose of 2.5 mL.  Do not take more than 2 times daily.   Blood Transfusion, Adult, Care After After a blood transfusion, it is common to have: Bruising and soreness at the IV site. A headache. Follow these instructions at home: Your doctor may give you more instructions. If you have problems, contact your doctor. Insertion site care     Follow instructions from your doctor about how to take care of your insertion site. This is where an IV tube was put into your vein. Make sure you: Wash your hands with soap and water for at least 20 seconds before and after you change your bandage. If you cannot use soap and water, use hand sanitizer. Change your bandage as told by your doctor. Check your insertion site every day for signs of infection. Check for: Redness, swelling, or pain. Bleeding from the site. Warmth. Pus or a bad smell. General instructions Take over-the-counter and prescription medicines only as told by your doctor. Rest as told by your doctor. Go back to your normal activities as told by your doctor. Keep all follow-up visits. You may need to have tests at certain times to check your blood. Contact a doctor if: You have itching or red, swollen areas of skin (hives). You have a fever or chills. You have pain in the head, back, or chest. You feel worried or nervous (anxious). You feel weak after doing your normal activities. You have any of these problems at the insertion site: Redness, swelling, warmth, or pain. Bleeding that does not stop with pressure. Pus or a bad smell. If you received your blood transfusion in an outpatient setting, you will be told whom to contact to report any reactions. Get help right away if: You have signs  of a serious reaction. This may be coming from an allergy or the body's defense system (immune system). Signs include: Trouble breathing or shortness of breath. Swelling of the face or feeling warm (flushed). A widespread rash. Dark pee (urine) or blood in the pee. Fast heartbeat. These symptoms may be an emergency. Get help right away. Call 911. Do not wait to see if the symptoms will go away. Do not drive yourself to the hospital. Summary Bruising and soreness at the IV site are common. Check your insertion site every day for signs of infection. Rest as told by your doctor. Go back to your normal activities as told by your doctor. Get help right away if you have signs of a serious reaction. This information is not intended to replace advice given to you by your health care provider. Make sure you discuss any questions you have with your health care provider. Document Revised: 08/24/2021 Document Reviewed: 08/24/2021 Elsevier Patient Education  2024 [employer].

## 2023-06-06 NOTE — Progress Notes (Signed)
Patient requesting palliative care referral. Order placed per OK of NP.

## 2023-06-06 NOTE — Progress Notes (Signed)
Patient has sent MyChart message regarding dry cough. Message forwarded to Lonna Cobb, NP. Prescription for Tussionex sent to patient's pharmacy.  The following instructions from Lonna Cobb, NP were given to patient regarding prescription:  A prescription for chlorpheniramine-HYDROcodone (TUSSIONEX) has been sent to your pharmacy.  Please note, it contains hydrocodone (pain medicine) so sedation can occur, no driving or performing tasks that require alertness. Start with an initial dose of 2.5 mL.  Do not take more than 2 times daily.         Patient verbalized understanding and agreed to contact office with any future questions or concerns.    Patient also inquired about palliative care.  Education given to patient on what palliative care is.  Patient requested for a referral to be sent to palliative care.  Lonna Cobb, NP notified of patient's request.

## 2023-06-07 ENCOUNTER — Inpatient Hospital Stay: Payer: BC Managed Care – PPO

## 2023-06-08 ENCOUNTER — Ambulatory Visit (INDEPENDENT_AMBULATORY_CARE_PROVIDER_SITE_OTHER): Payer: BC Managed Care – PPO

## 2023-06-08 ENCOUNTER — Ambulatory Visit
Admission: RE | Admit: 2023-06-08 | Discharge: 2023-06-08 | Disposition: A | Discharge status: Home / Self Care | Payer: BC Managed Care – PPO | Source: Ambulatory Visit | Attending: Internal Medicine | Admitting: Internal Medicine

## 2023-06-08 VITALS — BP 108/76 | HR 106 | Temp 99.4°F | Resp 18

## 2023-06-08 DIAGNOSIS — J209 Acute bronchitis, unspecified: Secondary | ICD-10-CM

## 2023-06-08 DIAGNOSIS — R051 Acute cough: Secondary | ICD-10-CM

## 2023-06-08 DIAGNOSIS — C801 Malignant (primary) neoplasm, unspecified: Secondary | ICD-10-CM | POA: Diagnosis not present

## 2023-06-08 DIAGNOSIS — C7800 Secondary malignant neoplasm of unspecified lung: Secondary | ICD-10-CM | POA: Diagnosis not present

## 2023-06-08 DIAGNOSIS — R059 Cough, unspecified: Secondary | ICD-10-CM | POA: Diagnosis not present

## 2023-06-08 DIAGNOSIS — R9389 Abnormal findings on diagnostic imaging of other specified body structures: Secondary | ICD-10-CM | POA: Diagnosis not present

## 2023-06-08 MED ORDER — BENZONATATE 200 MG PO CAPS: 200.0000 mg | ORAL_CAPSULE | Freq: Three times a day (TID) | ORAL | 0 refills | Status: DC | PRN

## 2023-06-08 MED ORDER — FLUCONAZOLE 150 MG PO TABS: 150.0000 mg | ORAL_TABLET | Freq: Every day | ORAL | 0 refills | Status: DC

## 2023-06-08 MED ORDER — DOXYCYCLINE HYCLATE 100 MG PO CAPS: 100.0000 mg | ORAL_CAPSULE | Freq: Two times a day (BID) | ORAL | 0 refills | Status: AC

## 2023-06-08 NOTE — ED Triage Notes (Signed)
Pt presents to UC for c/o cough x1 week. Pt has tried robitussin. 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

## 2023-06-08 NOTE — ED Provider Notes (Addendum)
[facility]    CSN: [id number] Arrival date & time: 06/08/23  0904      History   Chief Complaint Chief Complaint  Patient presents with   Cough    APPT 0900    HPI Alyssa Ross is a 57 y.o. female  presents for evaluation of URI symptoms for 7 days. Patient reports associated symptoms of dry cough. Denies N/V/D, fevers, sore throat, congestion, ear pain, body aches, shortness of breath. Patient does not have a hx of asthma. Patient is not an active smoker.  Is currently undergoing chemotherapy for pancreatic cancer.  reports no sick contacts.  Pt has taken cough medicine OTC for symptoms.  She did contact her oncologist on 12/27 regarding cough and was sent in a prescription for Tussionex which patient has not picked up yet.  Recent blood work from 12/26 shows white blood cell count of 2.6.  Pt has no other concerns at this time.    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   Yeast vaginitis 2006    Patient Active Problem List   Diagnosis Date Noted   Encounter for general adult medical examination w/o abnormal findings 03/02/2021   Lung nodules 01/04/2021   Cavitary lesion of lung 11/15/2020   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   Port-A-Cath in place 04/27/2019   Pancreatic cancer (HCC) 03/04/2019   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   Genetic testing 02/23/2019   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   Family history of pancreatic cancer    Essential hypertension 12/22/2012   Pure hypercholesterolemia 12/22/2012   Uncontrolled type 2 diabetes mellitus with hyperglycemia, with long-term current use of insulin (HCC) 12/17/2012    Past Surgical History:  Procedure Laterality Date   BILIARY STENT PLACEMENT N/A 12/25/2018   Procedure: BILIARY STENT PLACEMENT;  Surgeon: Jeani Hawking, MD;  Location: [facility];  Service: Endoscopy;  Laterality: N/A;   BILIARY STENT PLACEMENT N/A 01/01/2019   Procedure: BILIARY STENT PLACEMENT;  Surgeon: Jeani Hawking, MD;  Location: [facility];  Service: Endoscopy;  Laterality: N/A;   BRONCHIAL BIOPSY  01/30/2021   Procedure: BRONCHIAL BIOPSIES;  Surgeon: Josephine Igo, DO;  Location: [facility];  Service: Pulmonary;;   BRONCHIAL BRUSHINGS  01/30/2021   Procedure: BRONCHIAL BRUSHINGS;  Surgeon: Josephine Igo, DO;  [facility]: [facility];  Service: Pulmonary;;   BRONCHIAL NEEDLE ASPIRATION BIOPSY  01/30/2021   Procedure: BRONCHIAL NEEDLE ASPIRATION BIOPSIES;  Surgeon: Josephine Igo, DO;  [facility];  Service: Pulmonary;;   BRONCHIAL WASHINGS  01/30/2021   Procedure: BRONCHIAL WASHINGS;  Surgeon: Josephine Igo, DO;  [facility];  Service: Pulmonary;;   DILATATION & CURETTAGE/HYSTEROSCOPY WITH TRUECLEAR N/A 01/20/2014   Procedure: DILATATION & CURETTAGE/HYSTEROSCOPY WITH TRUCLEAR;  Surgeon: Michael Litter, MD;  Location: [facility]  ORS;  Service: Gynecology;  Laterality: N/A;   DILATATION & CURRETTAGE/HYSTEROSCOPY WITH RESECTOCOPE N/A 01/20/2014   Procedure: DILATATION & CURETTAGE/HYSTEROSCOPY WITH RESECTOCOPE;  Surgeon: Michael Litter, MD;  Location: [facility] ORS;  Service: Gynecology;  Laterality: N/A;   ENDOSCOPIC RETROGRADE CHOLANGIOPANCREATOGRAPHY (ERCP) WITH PROPOFOL N/A 12/25/2018   Procedure: ENDOSCOPIC RETROGRADE  CHOLANGIOPANCREATOGRAPHY (ERCP) WITH PROPOFOL;  Surgeon: Jeani Hawking, MD;  [facility]: [facility];  Service: Endoscopy;  Laterality: N/A;   ENDOSCOPIC RETROGRADE CHOLANGIOPANCREATOGRAPHY (ERCP) WITH PROPOFOL N/A 01/01/2019   Procedure: ENDOSCOPIC RETROGRADE CHOLANGIOPANCREATOGRAPHY (ERCP) WITH PROPOFOL;  Surgeon: Jeani Hawking, MD;  Location: [facility];  Service: Endoscopy;  Laterality: N/A;   ERCP  12/25/2018   FINE NEEDLE ASPIRATION N/A 01/01/2019   Procedure: FINE NEEDLE ASPIRATION (FNA) LINEAR;  Surgeon: Jeani Hawking, MD;  [facility]: [facility];  Service: Endoscopy;  Laterality: N/A;   LAPAROSCOPY N/A 03/04/2019   Procedure: LAPAROSCOPY DIAGNOSTIC;  Surgeon: Almond Lint, MD;  [facility];  Service: General;  Laterality: N/A;  GENERAL AND EPIDURAL   PARTIAL NEPHRECTOMY Right 03/04/2019   Procedure: Nephrectomy Partial;  Surgeon: Malen Gauze, MD;  Location: [facility] OR;  Service: Urology;  Laterality: Right;   PORTACATH PLACEMENT Left 04/21/2019   Procedure: INSERTION PORT-A-CATH;  Surgeon: Almond Lint, MD;  Location[facility];  Service: General;  Laterality: Left;   SPHINCTEROTOMY  12/25/2018   Procedure: Dennison Mascot;  Surgeon: Jeani Hawking, MD;  [facility]: [facility];  Service: Endoscopy;;   STENT REMOVAL  01/01/2019   Procedure: STENT REMOVAL;  Surgeon: Jeani Hawking, MD;  [facility];  Service: Endoscopy;;   UPPER ESOPHAGEAL ENDOSCOPIC ULTRASOUND (EUS) N/A 01/01/2019   Procedure: UPPER ESOPHAGEAL ENDOSCOPIC ULTRASOUND (EUS);  Surgeon: Jeani Hawking, MD;  Location: [facility];  Service: Endoscopy;  Laterality: N/A;   VIDEO BRONCHOSCOPY WITH ENDOBRONCHIAL NAVIGATION Bilateral 01/30/2021   Procedure: VIDEO BRONCHOSCOPY WITH ENDOBRONCHIAL NAVIGATION;  Surgeon: Josephine Igo, DO;  [facility];  Service: Pulmonary;  Laterality: Bilateral;  ION   VIDEO BRONCHOSCOPY WITH RADIAL ENDOBRONCHIAL ULTRASOUND  01/30/2021   Procedure: RADIAL ENDOBRONCHIAL  ULTRASOUND;  Surgeon: Josephine Igo, DO;  Location: [facility];  Service: Pulmonary;;   WHIPPLE PROCEDURE N/A 03/04/2019   Procedure: WHIPPLE PROCEDURE;  Surgeon: Almond Lint, MD;  [facility];  Service: General;  Laterality: N/A;  GENERAL AND EPIDURAL    OB History   No obstetric history on file.      Home Medications    Prior to Admission medications   Medication Sig Start Date End Date Taking? Authorizing Provider  benzonatate (TESSALON) 200 MG capsule Take 1 capsule (200 mg total) by mouth 3 (three) times daily as needed. 06/08/23  Yes Radford Pax, NP  doxycycline (VIBRAMYCIN) 100 MG capsule Take 1 capsule (100 mg total) by mouth 2 (two) times daily for 7 days. 06/08/23 06/15/23 Yes Radford Pax, NP  fluconazole (DIFLUCAN) 150 MG tablet Take 1 tablet (150 mg total) by mouth daily. 06/08/23  Yes Radford Pax, NP  apixaban (ELIQUIS) 5 MG TABS tablet Take 1 tablet (5 mg total) by mouth 2 (two) times daily. 06/05/23   Rana Snare, NP  cetirizine (ZYRTEC) 10 MG tablet Take 10 mg by mouth daily.    [provider]  chlorpheniramine-HYDROcodone (TUSSIONEX) 10-8 MG/5ML Take 2.5-5 mLs by mouth every 12 (twelve) hours as needed for cough. Do not drive while taking 60/45/40   Rana Snare, NP  Continuous Blood Gluc Receiver (FREESTYLE LIBRE 14 DAY READER) DEVI USE READER TO CHECK  BLOOD GLUCOSE WITH FREESTYLE LIBRE SENSORS. 12/25/17   Reather Littler, MD  Continuous Blood Gluc Sensor (FREESTYLE LIBRE 2 SENSOR) MISC APPLY EVERY 14 DAYS 09/25/20   Reather Littler, MD  glucose blood test strip Check sugar 2 times daily. 03/21/20   Reather Littler, MD  Insulin Degludec (TRESIBA FLEXTOUCH [place]) Inject into the skin. 12-18 Units    [provider]  insulin lispro (HUMALOG KWIKPEN) 200 UNIT/ML KwikPen 6 units with breakfast, 13 units with lunch, 16 units with dinner 05/17/21   [provider]  Insulin Pen Needle (BD PEN NEEDLE NANO U/F) 32G X 4 MM MISC USE TO INJECT INSULIN  THREE TIMES A DAY 02/19/21   Reather Littler, MD  lidocaine-prilocaine (EMLA) cream Apply 1 Application topically as directed. Apply 1 hour prior to stick and cover with plastic wrap Patient not taking: Reported on 06/05/2023 03/28/22   Rana Snare, NP  magic mouthwash SOLN Take 5 mLs by mouth 4 (four) times daily as needed for mouth pain. Patient not taking: Reported on 06/05/2023 06/27/21   Ladene Artist, MD  magnesium oxide (MAG-OX) 400 (240 Mg) MG tablet TAKE 1 TABLET(400 MG) BY MOUTH TWICE DAILY 03/21/23   Ladene Artist, MD  metroNIDAZOLE (METROCREAM) 0.75 % cream Apply 1 Application topically 2 (two) times daily. To face Patient not taking: Reported on 06/05/2023 03/09/22   [provider]  ondansetron (ZOFRAN) 8 MG tablet Take 1 tablet (8 mg total) by mouth every 8 (eight) hours as needed for nausea or vomiting (do not begin until 72 hours after chemo). Patient not taking: Reported on 06/05/2023 05/21/23   Rana Snare, NP  potassium chloride SA (KLOR-CON M) 20 MEQ tablet Take 1 tablet (20 mEq total) by mouth 3 (three) times daily. 03/13/23   Rana Snare, NP  prochlorperazine (COMPAZINE) 10 MG tablet TAKE 1 TABLET(10 MG) BY MOUTH EVERY 6 HOURS AS NEEDED FOR NAUSEA OR VOMITING Patient not taking: Reported on 06/05/2023 05/21/23   Rana Snare, NP  rosuvastatin (CRESTOR) 10 MG tablet TAKE 1 TABLET AT BEDTIME 12/02/22   Dorothyann Peng, MD  spironolactone (ALDACTONE) 25 MG tablet TAKE 1 TABLET(25 MG) BY MOUTH DAILY 05/14/23   Dorothyann Peng, MD  tretinoin (RETIN-A) 0.05 % cream Apply topically at bedtime. Patient not taking: Reported on 06/05/2023 06/21/22   [provider]  valsartan (DIOVAN) 160 MG tablet TAKE 1 TABLET BY MOUTH TWICE DAILY 07/17/22   Dorothyann Peng, MD    Family History Family History  Problem Relation Age of Onset   Diabetes Mother    Dementia Mother    Hyperlipidemia Mother    Hypertension Mother    Irregular heart beat Mother    Diabetes  Father    Hypertension Father    Hyperlipidemia Father    Down syndrome Sister    Diabetes Sister    Hyperlipidemia Brother    Heart Problems Brother    Goiter Maternal Aunt    Thyroid nodules Sister    Cancer Cousin 60       eye; maternal first cousin   Goiter Cousin    Cancer Paternal Aunt        unknown form of cancer   Cancer Cousin        unknown form of cancer; paternal first cousin   Pancreatic cancer Cousin 43       paternal first cousin   Cancer Cousin 75       unknown cancer; paternal first cousin   Cancer  Cousin 31       unknown cancer; paternal first cousin    Social History Social History   Tobacco Use   Smoking status: Never   Smokeless tobacco: Never  Vaping Use   Vaping status: Never Used  Substance Use Topics   Alcohol use: Not Currently   Drug use: No     Allergies   Cherry and Lemon oil   Review of Systems Review of Systems  Respiratory:  Positive for cough.      Physical Exam Triage Vital Signs ED Triage Vitals  Encounter Vitals Group     BP 06/08/23 0913 108/76     Systolic BP Percentile --      Diastolic BP Percentile --      Pulse Rate 06/08/23 0913 (!) 106     Resp 06/08/23 0913 18     Temp 06/08/23 0913 99.4 F (37.4 C)     Temp Source 06/08/23 0913 Oral     SpO2 06/08/23 0913 96 %     Weight --      Height --      Head Circumference --      Peak Flow --      Pain Score 06/08/23 0910 0     Pain Loc --      Pain Education --      Exclude from Growth Chart --    No data found.  Updated Vital Signs BP 108/76 (BP Location: Left Arm)   Pulse (!) 106   Temp 99.4 F (37.4 C) (Oral)   Resp 18   LMP 09/17/2012   SpO2 96%   Visual Acuity Right Eye Distance:   Left Eye Distance:   Bilateral Distance:    Right Eye Near:   Left Eye Near:    Bilateral Near:     Physical Exam Vitals and nursing note reviewed.  Constitutional:      General: She is not in acute distress.    Appearance: She is well-developed. She is  not ill-appearing.  HENT:     Head: Normocephalic and atraumatic.     Right Ear: Tympanic membrane and ear canal normal.     Left Ear: Tympanic membrane and ear canal normal.     Nose: No congestion.     Mouth/Throat:     Mouth: Mucous membranes are moist.     Pharynx: Oropharynx is clear. Uvula midline. No oropharyngeal exudate or posterior oropharyngeal erythema.     Tonsils: No tonsillar exudate or tonsillar abscesses.  Eyes:     Conjunctiva/sclera: Conjunctivae normal.     Pupils: Pupils are equal, round, and reactive to light.  Cardiovascular:     Rate and Rhythm: Regular rhythm. Tachycardia present.     Heart sounds: Normal heart sounds.     Comments: Mildly tachycardia at 106 Pulmonary:     Effort: Pulmonary effort is normal. No respiratory distress.     Breath sounds: Normal breath sounds. No stridor. No wheezing, rhonchi or rales.  Musculoskeletal:     Cervical back: Normal range of motion and neck supple.  Lymphadenopathy:     Cervical: No cervical adenopathy.  Skin:    General: Skin is warm and dry.  Neurological:     General: No focal deficit present.     Mental Status: She is alert and oriented to person, place, and time.  Psychiatric:        Mood and Affect: Mood normal.        Behavior: Behavior normal.  UC Treatments / Results  Labs (all labs ordered are listed, but only abnormal results are displayed) Labs Reviewed - No data to display  EKG   Radiology DG Chest 2 View Result Date: 06/08/2023 CLINICAL DATA:  Cough for 1 week. EXAM: CHEST - 2 VIEW COMPARISON:  10/22/2021. FINDINGS: Elevated right hemidiaphragm noted. There are multiple ring-like opacities throughout bilateral lungs, which corresponds to multiple cavitary metastatic lesions on the recent CT scan chest from 04/21/2023. No acute consolidation or lung collapse. Bilateral costophrenic angles are clear. Normal cardio-mediastinal silhouette. No acute osseous abnormalities. The soft tissues  are within normal limits. Left-sided CT Port-A-Cath is seen with its tip overlying the upper portion of superior vena cava. IMPRESSION: *No acute cardiopulmonary disease. Redemonstration of multiple cavitary lung metastases. Electronically Signed   By: Jules Schick M.D.   On: 06/08/2023 09:38    Procedures Procedures (including critical care time)  Medications Ordered in UC Medications - No data to display  Initial Impression / Assessment and Plan / UC Course  I have reviewed the triage vital signs and the nursing notes.  Pertinent labs & imaging results that were available during my care of the patient were reviewed by me and considered in my medical decision making (see chart for details).     Reviewed exam and symptoms with patient.  No red flags.  Chest x-ray negative for pneumonia.  Patient undergoing chemotherapy for metastatic pancreatic cancer presenting with 1 week of worsening cough.  Does have low-grade fever in clinic.  Given this we will start doxycycline.  Tessalon as needed for cough.  Patient reports antibiotic induced yeast infections, Diflucan sent.  She can pick up the tussionex that has already been sent in by her oncologist.  Advised PCP follow-up 2 days for recheck.  Strict ER precautions reviewed and patient verbalized understanding. Final Clinical Impressions(s) / UC Diagnoses   Final diagnoses:  Acute cough  Acute bronchitis, unspecified organism     Discharge Instructions      Start doxycycline twice daily for 7 days.  You may take Tessalon 3 times a day as needed for cough during the day.  Lots of rest and fluids.  Diflucan has been sent to prevent antibiotic induced yeast infection.  Please follow-up with your PCP in 2 days for recheck.  Please go to the ER if you develop any worsening symptoms.  I hope you feel better soon!     ED Prescriptions     Medication Sig Dispense Auth. Provider   doxycycline (VIBRAMYCIN) 100 MG capsule Take 1 capsule (100 mg  total) by mouth 2 (two) times daily for 7 days. 14 capsule Radford Pax, NP   benzonatate (TESSALON) 200 MG capsule Take 1 capsule (200 mg total) by mouth 3 (three) times daily as needed. 20 capsule Radford Pax, NP   fluconazole (DIFLUCAN) 150 MG tablet Take 1 tablet (150 mg total) by mouth daily. 1 tablet Radford Pax, NP      PDMP not reviewed this encounter.   Radford Pax, NP 06/08/23 0981    Radford Pax, NP 06/08/23 [phone]

## 2023-06-08 NOTE — Discharge Instructions (Addendum)
Start doxycycline twice daily for 7 days.  You may take Tessalon 3 times a day as needed for cough during the day.  Lots of rest and fluids.  Diflucan has been sent to prevent antibiotic induced yeast infection.  Please follow-up with your PCP in 2 days for recheck.  Please go to the ER if you develop any worsening symptoms.  I hope you feel better soon!

## 2023-06-09 LAB — TYPE AND SCREEN
ABO/RH(D): AB POS
Antibody Screen: NEGATIVE
Unit division: 0

## 2023-06-09 LAB — BPAM RBC
Blood Product Expiration Date: 202501262359
ISSUE DATE / TIME: 202412270700
Unit Type and Rh: 6200

## 2023-06-14 DIAGNOSIS — R059 Cough, unspecified: Secondary | ICD-10-CM | POA: Diagnosis not present

## 2023-06-14 DIAGNOSIS — R739 Hyperglycemia, unspecified: Secondary | ICD-10-CM | POA: Diagnosis not present

## 2023-06-14 DIAGNOSIS — R079 Chest pain, unspecified: Secondary | ICD-10-CM | POA: Diagnosis not present

## 2023-06-14 DIAGNOSIS — R509 Fever, unspecified: Secondary | ICD-10-CM | POA: Diagnosis not present

## 2023-06-18 ENCOUNTER — Encounter: Payer: Self-pay | Admitting: Nurse Practitioner

## 2023-06-18 ENCOUNTER — Other Ambulatory Visit: Payer: Self-pay

## 2023-06-18 ENCOUNTER — Inpatient Hospital Stay: Payer: BC Managed Care – PPO

## 2023-06-18 ENCOUNTER — Inpatient Hospital Stay: Payer: BC Managed Care – PPO | Admitting: Nurse Practitioner

## 2023-06-18 ENCOUNTER — Inpatient Hospital Stay: Payer: BC Managed Care – PPO | Attending: Genetic Counselor

## 2023-06-18 VITALS — BP 129/81 | HR 87 | Temp 98.1°F | Resp 18 | Ht 68.0 in | Wt 182.1 lb

## 2023-06-18 VITALS — BP 131/88 | HR 78 | Resp 18

## 2023-06-18 DIAGNOSIS — Z95828 Presence of other vascular implants and grafts: Secondary | ICD-10-CM

## 2023-06-18 DIAGNOSIS — Z5111 Encounter for antineoplastic chemotherapy: Secondary | ICD-10-CM | POA: Insufficient documentation

## 2023-06-18 DIAGNOSIS — C7801 Secondary malignant neoplasm of right lung: Secondary | ICD-10-CM | POA: Diagnosis not present

## 2023-06-18 DIAGNOSIS — C250 Malignant neoplasm of head of pancreas: Secondary | ICD-10-CM

## 2023-06-18 DIAGNOSIS — C259 Malignant neoplasm of pancreas, unspecified: Secondary | ICD-10-CM | POA: Diagnosis not present

## 2023-06-18 DIAGNOSIS — C7802 Secondary malignant neoplasm of left lung: Secondary | ICD-10-CM | POA: Insufficient documentation

## 2023-06-18 LAB — CMP (CANCER CENTER ONLY)
ALT: 8 U/L (ref 0–44)
AST: 14 U/L — ABNORMAL LOW (ref 15–41)
Albumin: 2.6 g/dL — ABNORMAL LOW (ref 3.5–5.0)
Alkaline Phosphatase: 122 U/L (ref 38–126)
Anion gap: 5 (ref 5–15)
BUN: 9 mg/dL (ref 6–20)
CO2: 30 mmol/L (ref 22–32)
Calcium: 8.3 mg/dL — ABNORMAL LOW (ref 8.9–10.3)
Chloride: 107 mmol/L (ref 98–111)
Creatinine: 0.8 mg/dL (ref 0.44–1.00)
GFR, Estimated: >60 mL/min (ref >60)
Glucose, Bld: 103 mg/dL — ABNORMAL HIGH (ref 70–99)
Potassium: 3.5 mmol/L (ref 3.5–5.1)
Sodium: 142 mmol/L (ref 135–145)
Total Bilirubin: 0.4 mg/dL (ref 0.0–1.2)
Total Protein: 7 g/dL (ref 6.5–8.1)

## 2023-06-18 LAB — CBC WITH DIFFERENTIAL (CANCER CENTER ONLY)
Abs Immature Granulocytes: 0.02 10*3/uL (ref 0.00–0.07)
Basophils Absolute: 0.1 10*3/uL (ref 0.0–0.1)
Basophils Relative: 1 %
Eosinophils Absolute: 0.1 10*3/uL (ref 0.0–0.5)
Eosinophils Relative: 1 %
HCT: 27.9 % — ABNORMAL LOW (ref 36.0–46.0)
Hemoglobin: 8.5 g/dL — ABNORMAL LOW (ref 12.0–15.0)
Immature Granulocytes: 0 %
Lymphocytes Relative: 24 %
Lymphs Abs: 1.3 10*3/uL (ref 0.7–4.0)
MCH: 28.4 pg (ref 26.0–34.0)
MCHC: 30.5 g/dL (ref 30.0–36.0)
MCV: 93.3 fL (ref 80.0–100.0)
Monocytes Absolute: 0.4 10*3/uL (ref 0.1–1.0)
Monocytes Relative: 8 %
Neutro Abs: 3.5 10*3/uL (ref 1.7–7.7)
Neutrophils Relative %: 66 %
Platelet Count: 642 10*3/uL — ABNORMAL HIGH (ref 150–400)
RBC: 2.99 MIL/uL — ABNORMAL LOW (ref 3.87–5.11)
RDW: 16.9 % — ABNORMAL HIGH (ref 11.5–15.5)
WBC Count: 5.4 10*3/uL (ref 4.0–10.5)
nRBC: 0 % (ref 0.0–0.2)

## 2023-06-18 LAB — MAGNESIUM: Magnesium: 1.6 mg/dL — ABNORMAL LOW (ref 1.7–2.4)

## 2023-06-18 MED ORDER — SODIUM CHLORIDE 0.9 % IV SOLN
INTRAVENOUS | Status: DC
Start: 2023-06-18 — End: 2023-06-18

## 2023-06-18 MED ORDER — SODIUM CHLORIDE 0.9 % IV SOLN
55.0000 mg/m2 | Freq: Once | INTRAVENOUS | Status: AC
  Administered 2023-06-18: 107.5 mg via INTRAVENOUS
  Filled 2023-06-18: qty 25

## 2023-06-18 MED ORDER — SODIUM CHLORIDE 0.9 % IV SOLN
400.0000 mg/m2 | Freq: Once | INTRAVENOUS | Status: AC
  Administered 2023-06-18: 788 mg via INTRAVENOUS
  Filled 2023-06-18: qty 39.4

## 2023-06-18 MED ORDER — SODIUM CHLORIDE 0.9% FLUSH: 10.0000 mL | INTRAVENOUS | Status: DC | PRN

## 2023-06-18 MED ORDER — SODIUM CHLORIDE 0.9% FLUSH
10.0000 mL | Freq: Once | INTRAVENOUS | Status: AC
  Administered 2023-06-18: 10 mL

## 2023-06-18 MED ORDER — HEPARIN SOD (PORK) LOCK FLUSH 100 UNIT/ML IV SOLN: 500.0000 [IU] | Freq: Once | INTRAVENOUS | Status: DC | PRN

## 2023-06-18 MED ORDER — ATROPINE SULFATE 1 MG/ML IV SOLN
0.5000 mg | Freq: Once | INTRAVENOUS | Status: AC | PRN
  Administered 2023-06-18: 0.5 mg via INTRAVENOUS
  Filled 2023-06-18: qty 1

## 2023-06-18 MED ORDER — SODIUM CHLORIDE 0.9 % IV SOLN
2400.0000 mg/m2 | INTRAVENOUS | Status: DC
  Administered 2023-06-18: 5000 mg via INTRAVENOUS
  Filled 2023-06-18: qty 100

## 2023-06-18 MED ORDER — DEXAMETHASONE SODIUM PHOSPHATE 10 MG/ML IJ SOLN
10.0000 mg | Freq: Once | INTRAMUSCULAR | Status: AC
  Administered 2023-06-18: 10 mg via INTRAVENOUS
  Filled 2023-06-18: qty 1

## 2023-06-18 MED ORDER — PALONOSETRON HCL INJECTION 0.25 MG/5ML
0.2500 mg | Freq: Once | INTRAVENOUS | Status: AC
  Administered 2023-06-18: 0.25 mg via INTRAVENOUS
  Filled 2023-06-18: qty 5

## 2023-06-18 NOTE — Patient Instructions (Addendum)
 [facility] - A DEPT OF [facility]  Discharge Instructions: Thank you for choosing [facility] to provide your oncology and hematology care.   If you have a lab appointment with the Cancer Center, please go directly to the Cancer Center and check in at the registration area.   Wear comfortable clothing and clothing appropriate for easy access to any Portacath or PICC line.   We strive to give you quality time with your provider. You may need to reschedule your appointment if you arrive late (15 or more minutes).  Arriving late affects you and other patients whose appointments are after yours.  Also, if you miss three or more appointments without notifying the office, you may be dismissed from the clinic at the provider's discretion.      For prescription refill requests, have your pharmacy contact our office and allow 72 hours for refills to be completed.    Today you received the following chemotherapy and/or immunotherapy agents Irinotecan  Liposome, Leucovorin  and Fluorouracil       To help prevent nausea and vomiting after your treatment, we encourage you to take your nausea medication as directed.  BELOW ARE SYMPTOMS THAT SHOULD BE REPORTED IMMEDIATELY: *FEVER GREATER THAN 100.4 F (38 C) OR HIGHER *CHILLS OR SWEATING *NAUSEA AND VOMITING THAT IS NOT CONTROLLED WITH YOUR NAUSEA MEDICATION *UNUSUAL SHORTNESS OF BREATH *UNUSUAL BRUISING OR BLEEDING *URINARY PROBLEMS (pain or burning when urinating, or frequent urination) *BOWEL PROBLEMS (unusual diarrhea, constipation, pain near the anus) TENDERNESS IN MOUTH AND THROAT WITH OR WITHOUT PRESENCE OF ULCERS (sore throat, sores in mouth, or a toothache) UNUSUAL RASH, SWELLING OR PAIN  UNUSUAL VAGINAL DISCHARGE OR ITCHING   Items with * indicate a potential emergency and should be followed up as soon as possible or go to the Emergency Department if any problems should occur.  Please show the  CHEMOTHERAPY ALERT CARD or IMMUNOTHERAPY ALERT CARD at check-in to the Emergency Department and triage nurse.  Should you have questions after your visit or need to cancel or reschedule your appointment, please contact [facility] - A DEPT OF [facility]  Dept: [phone]  and follow the prompts.  Office hours are 8:00 a.m. to 4:30 p.m. Monday - Friday. Please note that voicemails left after 4:00 p.m. may not be returned until the following business day.  We are closed weekends and major holidays. You have access to a nurse at all times for urgent questions. Please call the main number to the clinic Dept: [phone] and follow the prompts.   For any non-urgent questions, you may also contact your provider using MyChart. We now offer e-Visits for anyone 67 and older to request care online for non-urgent symptoms. For details visit [email or web address].   Also download the MyChart app! Go to the app store, search MyChart, open the app, select [place], and log in with your MyChart username and password.

## 2023-06-18 NOTE — Progress Notes (Signed)
 [facility] OFFICE PROGRESS NOTE   Diagnosis: Pancreas cancer  INTERVAL HISTORY:   Alyssa Ross returns as scheduled.  She completed cycle 1 FOLFIRI with liposomal irinotecan  05/21/2023.  Cycle 2 was held 06/05/2023 due to neutropenia.  She was seen in the emergency department 06/08/2023 for evaluation of a persistent cough.  Chest x-ray negative.  Cough is better.  No shortness of breath.  No fever.  She had an episode of right breast pain recently.  The pain resolved and has not recurred.  No nausea or vomiting.  Bowels are moving.  No hand or foot redness.  Some diarrhea which she attributes to an antibiotic.  Objective:  Vital signs in last 24 hours:  Blood pressure 129/81, pulse 87, temperature 98.1 F (36.7 C), temperature source Temporal, resp. rate 18, height 5' 8 (1.727 m), weight 182 lb 1.6 oz (82.6 kg), last menstrual period 09/17/2012, SpO2 100%.    HEENT: No thrush or ulcers. Resp: Lungs clear bilaterally. Cardio: Regular rate and rhythm. GI: Abdomen soft and nontender.  No hepatosplenomegaly. Vascular: No leg edema.  Port-A-Cath without erythema.  Lab Results:  Lab Results  Component Value Date   WBC 5.4 06/18/2023   HGB 8.5 (L) 06/18/2023   HCT 27.9 (L) 06/18/2023   MCV 93.3 06/18/2023   PLT 642 (H) 06/18/2023   NEUTROABS 3.5 06/18/2023    Imaging:  No results found.  Medications: I have reviewed the patient's current medications.  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 Tumor invades the duodenal wall and vascular groove, resection margins negative, 4/34 lymph nodes positive MSI-stable, tumor showed instability in 2 loci as did adjacent normal tissue Foundation 1-K-ras G12 V, microsatellite status and tumor mutation burden cannot be determined EUS FNA biopsy of pancreas mass on 07/03/2018-well-differentiated neuroendocrine tumor CTs 01/29/2019-ill-defined pancreas head mass,  5 pulmonary nodules-1 with a small amount of central cavitation, tumor abuts the left margin of the portal vein indistinct density surrounding, hepatic artery, complex cystic lesion of the right kidney, right adrenal mass-characterized as an adenoma on a [facility] MRI 12/21/2018 [facility]  03/03/2019-no focal pancreas activity, no tracer accumulation within the suspicious pulmonary nodules, left uterine mass with tracer accumulation felt to represent a leiomyoma Elevated preoperative CA 19-9--CA 19-9 186 on 01/18/2019 CT chest 04/16/2019-multiple bilateral pulmonary nodules, some with increased cavitation, stable in size Cycle 1 FOLFIRINOX 04/27/2019 Cycle 2 FOLFIRINOX 05/11/2019 Cycle 3 FOLFIRINOX 05/23/2019 Cycle 4 FOLFIRINOX 06/08/2019 Cycle 5 FOLFIRINOX 06/22/2019 CT chest 07/02/2019-stable size of bilateral pulmonary nodules.  Dominant cavitary lesions in both lungs show increased cavitation with thinner walls.  Stable 2.1 cm right adrenal nodule. Cycle 6 FOLFIRINOX 07/06/2019 Cycle 7 FOLFIRINOX 07/21/2019 Cycle 8 FOLFIRINOX 08/03/2019, oxaliplatin  deleted secondary to neuropathy CT chest 08/24/2019-decreased size of several lung nodules with resolution of a left upper lobe nodule, no new nodules Radiation to the pancreas surgical area with concurrent Xeloda  09/13/2019-10/20/2019  CTs 11/29/2019-multiple small pulmonary nodules scattered throughout the lungs bilaterally, appear increased in number and size. No definite evidence of metastatic disease in the abdomen or pelvis. Markedly enlarged and heterogeneous appearing uterus, likely to represent multifocal fibroids. 1 of these lesions appears to be an exophytic subserosal fibroid in the posterior lateral aspect of the uterine body on the left side although this comes in close proximity to the left adnexa such that a primary ovarian lesion is difficult to completely exclude. CTs 02/07/2020-slight enlargement of bilateral lung nodules, some are cavitary, no evidence  of metastatic disease in  the abdomen or pelvis, stable right adrenal nodule, uterine fibroids CTs 04/26/2020-mild enlargement of pulmonary nodules, slight increase in trace pelvic fluid, new soft tissue thickening inferior to the cecal tip suspicious for peritoneal metastasis CT 05/26/2020-improved appearance of soft tissue at the inferior tip of the cecum, mildly thickened short appendix-findings suggestive of resolving appendicitis, stable small bibasilar pulmonary nodules, fibroids Plan biopsy of right cecal tip soft tissue canceled secondary to radiologic improvement CT chest 08/02/2020-enlargement and progressive cavitation of multiple bilateral lung nodules.  Some new nodules are present. CTs 10/24/2020- increase in size of pulmonary nodules, no new nodules, no evidence of metastatic disease in the abdomen, stable right adrenal nodule CT 01/09/2021-slight interval enlargement of pulmonary nodules, stable right adrenal nodule Navigation bronchoscopy 01/30/2021-left lower lobe cavitary nodule FNA-adenocarcinoma, brushing-adenocarcinoma.  Left lower lobe lavage-adenocarcinoma.  Right upper lobe brushing and FNA biopsy of cavitary nodule-adenocarcinoma-immunohistochemical profile consistent with pancreas adenocarcinoma Cycle 1 gemcitabine /Abraxane  03/28/2021 Cycle 2 gemcitabine /Abraxane  04/11/2021 Cycle 3 gemcitabine /Abraxane  04/25/2021 Cycle 4 gemcitabine /Abraxane  05/09/2021 Cycle 5 gemcitabine /Abraxane  05/23/2021 CT chest 06/05/2021-interval cavitation of multiple pulmonary nodules, some nodules have decreased in size, no new or enlarging nodules Cycle 6 gemcitabine /Abraxane  06/06/2021 Cycle 7 gemcitabine /Abraxane  06/21/2021 Cycle 8 gemcitabine /Abraxane  07/05/2021 Cycle 9 Gemcitabine /Abraxane  07/19/2021 Cycle 10 gemcitabine  08/01/2021-Abraxane  held secondary to neuropathy CT chest 08/13/2021-mild decrease in size and wall thickness of multiple cavitary nodules, no new or progressive disease in the chest,  indeterminate low-attenuation right liver lesions Cycle 11 gemcitabine  08/16/2021-Abraxane  held secondary to neuropathy Cycle 12 gemcitabine  08/30/2021-Abraxane  held secondary to neuropathy Cycle 13 gemcitabine  09/13/2021-Abraxane  held secondary to neuropathy Cycle 14 gemcitabine  09/27/2021-Abraxane  held secondary to neuropathy Cycle 15 gemcitabine  10/11/2021-Abraxane  held secondary to neuropathy CTs 10/22/2021-no change in multiple cavitary lung nodules, no evidence of disease progression, ill-defined hypodense lesion in the posterior right liver suspicious for metastatic disease Cycle 16 gemcitabine  10/25/2021 Cycle 17 gemcitabine  11/08/2021 Cycle 18 Gemcitabine  11/22/2021 Cycle 19 gemcitabine  12/06/2021 Cycle 20 Gemcitabine  12/20/2021 CT 12/31/2021-mild increase in size of bilateral pulmonary metastases, stable subtle continuation right liver lesions Cycle 20 gemcitabine /Abraxane  01/03/2022 Cycle 21 gemcitabine /Abraxane  01/17/2022 Cycle 22 gemcitabine /Abraxane  01/31/2022 Cycle 23 gemcitabine /Abraxane  02/14/2022 Cycle 24 gemcitabine /Abraxane  02/28/2022 CTs 03/11/2022-widespread metastatic disease to the lungs again noted with slight involution of several of the pulmonary nodules, no definite new nodules noted; interval cavitation of solid lesion in the posterior aspect right lobe of the liver, no new liver lesions noted. Cycle 25 Gemcitabine /Abraxane  03/14/2022 Cycle 26 gemcitabine /Abraxane  03/28/2022 Cycle 27 gemcitabine /Abraxane  04/25/2022 Cycle 28 gemcitabine /Abraxane  05/09/2022 Cycle 29 Gemcitabine  05/23/2022, Abraxane  held due to neuropathy CTs 06/04/2022-stable multifocal cavitary pulmonary nodules.  No definite new nodules.  No new nodules greater than a centimeter.  Decreased size of the right posterior hepatic lobe lesion.  Generalized heterogeneity and new focal areas of hypodensity in the liver. Cycle 30 Gemcitabine  06/06/2022, Abraxane  held due to neuropathy Cycle 31 gemcitabine  06/20/2022, Abraxane   held due to neuropathy Cycle 32 gemcitabine  07/04/2022, Abraxane  held due to neuropathy Cycle 33 gemcitabine   07/18/2022 ,Abraxane  held due to neuropathy Cycle 34 gemcitabine  08/15/2022, Abraxane  held due to neuropathy Cycle 35 gemcitabine  08/29/2022, Abraxane  held due to neuropathy CTs 09/10/2022-enlarging pulmonary metastases.  Subtle poorly defined hypoattenuating lesions in the liver, less well-seen than on 06/04/2022.  Suspected new lesion in the dome of the right hepatic lobe Cycle 36 gemcitabine /Abraxane  09/12/2022 Cycle 37 gemcitabine /Abraxane  09/26/2022 Cycle 38 gemcitabine /Abraxane  10/10/2022 Cycle 39 gemcitabine /Abraxane  11/07/2022 Cycle 40 gemcitabine /Abraxane  11/21/2022 Cycle 41 gemcitabine /Abraxane  12/05/2022 Cycle 42 gemcitabine /Abraxane  12/19/2022 CT is 12/30/2022-new left hepatic lesion, stable bilateral pulmonary metastases Cycle 43  gemcitabine /Abraxane  01/02/2023 CT-guided biopsy of liver lesion 01/27/2023-acute/chronic inflammation, negative for malignancy, culture negative Cycle 44 gemcitabine /Abraxane  01/30/2023 Cycle 45 gemcitabine /Abraxane  02/13/2023 Cycle 46 gemcitabine /Abraxane  02/27/2023 Cycle 47 gemcitabine /Abraxane  03/13/2023 Cycle 48 gemcitabine /Abraxane  03/27/2023 CTs 04/21/2023-numerous solid and cavitary lung nodules overall similar, some slightly enlarged; multiple new and enlarging rim-enhancing liver lesions. 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  Apixaban  10/24/2022    Disposition: Alyssa Ross appears stable.  She has completed 1 cycle of FOLFIRI with liposomal irinotecan .  Cycle 2 was held due to neutropenia.  We had previously discussed white cell growth factor support on the day of pump discontinuation.  She is not comfortable receiving growth factor.  We discussed dose reducing the liposomal irinotecan .  She agrees with this plan.  Plan to proceed with cycle 2 FOLFIRI with the liposomal irinotecan  dose reduced today as scheduled.  CBC and chemistry panel reviewed.  Labs adequate to proceed as above.  She will return for follow-up and treatment in 2 weeks.  She understands to contact the office in the interim with fever, chills, other signs of infection or any other problems.  Olam Ned ANP/GNP-BC   06/18/2023  10:02 AM

## 2023-06-18 NOTE — Progress Notes (Signed)
 Patient seen by Olam Ned NP today  Vitals are within treatment parameters:Yes  magnesium  result is pending, poss. mag  Labs are within treatment parameters: Yes   Treatment plan has been signed: Yes   Per physician team, Patient is ready for treatment. Please note the following modifications:reduced irinotecan .

## 2023-06-19 ENCOUNTER — Encounter: Payer: Self-pay | Admitting: Oncology

## 2023-06-19 ENCOUNTER — Encounter: Payer: Self-pay | Admitting: Nurse Practitioner

## 2023-06-19 ENCOUNTER — Other Ambulatory Visit (HOSPITAL_BASED_OUTPATIENT_CLINIC_OR_DEPARTMENT_OTHER): Payer: Self-pay

## 2023-06-19 LAB — CANCER ANTIGEN 19-9: CA 19-9: 398 U/mL — ABNORMAL HIGH (ref 0–35)

## 2023-06-19 MED ORDER — NYSTATIN 100000 UNIT/ML MT SUSP
5.0000 mL | Freq: Four times a day (QID) | OROMUCOSAL | 1 refills | Status: DC | PRN
  Filled 2023-06-19: qty 240, 12d supply, fill #0

## 2023-06-20 ENCOUNTER — Inpatient Hospital Stay: Payer: BC Managed Care – PPO

## 2023-06-20 ENCOUNTER — Other Ambulatory Visit (HOSPITAL_BASED_OUTPATIENT_CLINIC_OR_DEPARTMENT_OTHER): Payer: Self-pay

## 2023-06-20 ENCOUNTER — Encounter: Payer: Self-pay | Admitting: Pharmacist

## 2023-06-20 VITALS — BP 118/77 | HR 70 | Temp 98.7°F | Resp 18

## 2023-06-20 DIAGNOSIS — C250 Malignant neoplasm of head of pancreas: Secondary | ICD-10-CM

## 2023-06-20 DIAGNOSIS — C7802 Secondary malignant neoplasm of left lung: Secondary | ICD-10-CM | POA: Diagnosis not present

## 2023-06-20 DIAGNOSIS — Z5111 Encounter for antineoplastic chemotherapy: Secondary | ICD-10-CM | POA: Diagnosis not present

## 2023-06-20 DIAGNOSIS — C7801 Secondary malignant neoplasm of right lung: Secondary | ICD-10-CM | POA: Diagnosis not present

## 2023-06-20 MED ORDER — SODIUM CHLORIDE 0.9% FLUSH
10.0000 mL | INTRAVENOUS | Status: DC | PRN
  Administered 2023-06-20: 10 mL

## 2023-06-20 MED ORDER — HEPARIN SOD (PORK) LOCK FLUSH 100 UNIT/ML IV SOLN
500.0000 [IU] | Freq: Once | INTRAVENOUS | Status: AC | PRN
  Administered 2023-06-20: 500 [IU]

## 2023-06-20 NOTE — Patient Instructions (Signed)

## 2023-06-21 DIAGNOSIS — C259 Malignant neoplasm of pancreas, unspecified: Secondary | ICD-10-CM | POA: Diagnosis not present

## 2023-06-23 ENCOUNTER — Encounter: Payer: BC Managed Care – PPO | Admitting: Internal Medicine

## 2023-06-23 ENCOUNTER — Other Ambulatory Visit: Payer: Self-pay | Admitting: Internal Medicine

## 2023-06-23 DIAGNOSIS — E1165 Type 2 diabetes mellitus with hyperglycemia: Secondary | ICD-10-CM

## 2023-06-23 DIAGNOSIS — I10 Essential (primary) hypertension: Secondary | ICD-10-CM

## 2023-06-23 DIAGNOSIS — G620 Drug-induced polyneuropathy: Secondary | ICD-10-CM

## 2023-06-23 LAB — HEMOGLOBIN A1C: Hemoglobin A1C: 9

## 2023-06-23 NOTE — Progress Notes (Signed)
 [id number]

## 2023-06-23 NOTE — Progress Notes (Deleted)
Same day cancellation

## 2023-06-23 NOTE — Patient Instructions (Signed)
 Hypertension, Adult Hypertension is another name for high blood pressure. High blood pressure forces your heart to work harder to pump blood. This can cause problems over time. There are two numbers in a blood pressure reading. There is a top number (systolic) over a bottom number (diastolic). It is best to have a blood pressure that is below 120/80. What are the causes? The cause of this condition is not known. Some other conditions can lead to high blood pressure. What increases the risk? Some lifestyle factors can make you more likely to develop high blood pressure: Smoking. Not getting enough exercise or physical activity. Being overweight. Having too much fat, sugar, calories, or salt (sodium) in your diet. Drinking too much alcohol. Other risk factors include: Having any of these conditions: Heart disease. Diabetes. High cholesterol. Kidney disease. Obstructive sleep apnea. Having a family history of high blood pressure and high cholesterol. Age. The risk increases with age. Stress. What are the signs or symptoms? High blood pressure may not cause symptoms. Very high blood pressure (hypertensive crisis) may cause: Headache. Fast or uneven heartbeats (palpitations). Shortness of breath. Nosebleed. Vomiting or feeling like you may vomit (nauseous). Changes in how you see. Very bad chest pain. Feeling dizzy. Seizures. How is this treated? This condition is treated by making healthy lifestyle changes, such as: Eating healthy foods. Exercising more. Drinking less alcohol. Your doctor may prescribe medicine if lifestyle changes do not help enough and if: Your top number is above 130. Your bottom number is above 80. Your personal target blood pressure may vary. Follow these instructions at home: Eating and drinking  If told, follow the DASH eating plan. To follow this plan: Fill one half of your plate at each meal with fruits and vegetables. Fill one fourth of your plate  at each meal with whole grains. Whole grains include whole-wheat pasta, brown rice, and whole-grain bread. Eat or drink low-fat dairy products, such as skim milk or low-fat yogurt. Fill one fourth of your plate at each meal with low-fat (lean) proteins. Low-fat proteins include fish, chicken without skin, eggs, beans, and tofu. Avoid fatty meat, cured and processed meat, or chicken with skin. Avoid pre-made or processed food. Limit the amount of salt in your diet to less than 1,500 mg each day. Do not drink alcohol if: Your doctor tells you not to drink. You are pregnant, may be pregnant, or are planning to become pregnant. If you drink alcohol: Limit how much you have to: 0-1 drink a day for women. 0-2 drinks a day for men. Know how much alcohol is in your drink. In the [place] one drink equals one 12 oz bottle of beer (355 mL), one 5 oz glass of wine (148 mL), or one 1 oz glass of hard liquor (44 mL). Lifestyle  Work with your doctor to stay at a healthy weight or to lose weight. Ask your doctor what the best weight is for you. Get at least 30 minutes of exercise that causes your heart to beat faster (aerobic exercise) most days of the week. This may include walking, swimming, or biking. Get at least 30 minutes of exercise that strengthens your muscles (resistance exercise) at least 3 days a week. This may include lifting weights or doing Pilates. Do not smoke or use any products that contain nicotine or tobacco. If you need help quitting, ask your doctor. Check your blood pressure at home as told by your doctor. Keep all follow-up visits. Medicines Take over-the-counter and prescription medicines  only as told by your doctor. Follow directions carefully. Do not skip doses of blood pressure medicine. The medicine does not work as well if you skip doses. Skipping doses also puts you at risk for problems. Ask your doctor about side effects or reactions to medicines that you should watch  for. Contact a doctor if: You think you are having a reaction to the medicine you are taking. You have headaches that keep coming back. You feel dizzy. You have swelling in your ankles. You have trouble with your vision. Get help right away if: You get a very bad headache. You start to feel mixed up (confused). You feel weak or numb. You feel faint. You have very bad pain in your: Chest. Belly (abdomen). You vomit more than once. You have trouble breathing. These symptoms may be an emergency. Get help right away. Call 911. Do not wait to see if the symptoms will go away. Do not drive yourself to the hospital. Summary Hypertension is another name for high blood pressure. High blood pressure forces your heart to work harder to pump blood. For most people, a normal blood pressure is less than 120/80. Making healthy choices can help lower blood pressure. If your blood pressure does not get lower with healthy choices, you may need to take medicine. This information is not intended to replace advice given to you by your health care provider. Make sure you discuss any questions you have with your health care provider. Document Revised: 03/15/2021 Document Reviewed: 03/15/2021 Elsevier Patient Education  2024 [employer].

## 2023-06-24 ENCOUNTER — Inpatient Hospital Stay (HOSPITAL_BASED_OUTPATIENT_CLINIC_OR_DEPARTMENT_OTHER): Payer: BC Managed Care – PPO | Admitting: Nurse Practitioner

## 2023-06-24 ENCOUNTER — Encounter: Payer: Self-pay | Admitting: Nurse Practitioner

## 2023-06-24 VITALS — BP 130/88 | HR 94 | Temp 97.3°F | Resp 16 | Wt 172.4 lb

## 2023-06-24 DIAGNOSIS — Z7189 Other specified counseling: Secondary | ICD-10-CM | POA: Diagnosis not present

## 2023-06-24 DIAGNOSIS — R530 Neoplastic (malignant) related fatigue: Secondary | ICD-10-CM

## 2023-06-24 DIAGNOSIS — R630 Anorexia: Secondary | ICD-10-CM

## 2023-06-24 DIAGNOSIS — M792 Neuralgia and neuritis, unspecified: Secondary | ICD-10-CM | POA: Diagnosis not present

## 2023-06-24 DIAGNOSIS — Z515 Encounter for palliative care: Secondary | ICD-10-CM

## 2023-06-24 DIAGNOSIS — R634 Abnormal weight loss: Secondary | ICD-10-CM

## 2023-06-24 NOTE — Progress Notes (Signed)
 Palliative Medicine [facility]  [phone] [phone]   Name: Alyssa Ross Date: 06/24/2023 MRN: [id number]  DOB: 1965/06/13  Patient Care Team: Jarold Medici, MD as PCP - General (Internal Medicine) Luvenia Morrison, RN (Inactive) as Oncology Nurse Navigator    REASON FOR CONSULTATION: Alyssa Ross is a 58 y.o. female with oncologic medical history including pancreatic cancer (01/2019). The patient is 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 and is currently receiving FOLFIRI with liposomal irinotecan . Palliative ask to see for symptom management and goals of care.    SOCIAL HISTORY:     reports that she has never smoked. She has never used smokeless tobacco. She reports that she does not currently use alcohol. She reports that she does not use drugs.  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   Yeast vaginitis 2006    PAST SURGICAL HISTORY:  Past Surgical History:  Procedure Laterality Date   BILIARY STENT PLACEMENT N/A 12/25/2018   Procedure: BILIARY STENT PLACEMENT;  Surgeon: Rollin Dover, MD;  Location: [facility];  Service: Endoscopy;  Laterality: N/A;   BILIARY STENT PLACEMENT N/A 01/01/2019   Procedure: BILIARY STENT PLACEMENT;  Surgeon:  Rollin Dover, MD;  Location: [facility];  Service: Endoscopy;  Laterality: N/A;   BRONCHIAL BIOPSY  01/30/2021   Procedure: BRONCHIAL BIOPSIES;  Surgeon: Brenna Adine CROME, DO;  Location: [facility];  Service: Pulmonary;;   BRONCHIAL BRUSHINGS  01/30/2021   Procedure: BRONCHIAL BRUSHINGS;  Surgeon: Brenna Adine CROME, DO;  [facility];  Service: Pulmonary;;   BRONCHIAL NEEDLE ASPIRATION BIOPSY  01/30/2021   Procedure: BRONCHIAL NEEDLE ASPIRATION BIOPSIES;  Surgeon: Brenna Adine CROME, DO;  [facility]: [facility];  Service: Pulmonary;;   BRONCHIAL WASHINGS  01/30/2021   Procedure: BRONCHIAL WASHINGS;  Surgeon: Brenna Adine CROME, DO;  [facility];  Service: Pulmonary;;   DILATATION & CURETTAGE/HYSTEROSCOPY WITH TRUECLEAR N/A 01/20/2014   Procedure: DILATATION & CURETTAGE/HYSTEROSCOPY WITH TRUCLEAR;  Surgeon: Ovid DELENA All, MD;  Location: [facility];  Service: Gynecology;  Laterality: N/A;   DILATATION & CURRETTAGE/HYSTEROSCOPY WITH RESECTOCOPE N/A 01/20/2014   Procedure: DILATATION & CURETTAGE/HYSTEROSCOPY WITH RESECTOCOPE;  Surgeon: Ovid DELENA All, MD;  Location: [facility];  Service: Gynecology;  Laterality: N/A;   ENDOSCOPIC RETROGRADE CHOLANGIOPANCREATOGRAPHY (ERCP) WITH PROPOFOL  N/A 12/25/2018   Procedure: ENDOSCOPIC RETROGRADE CHOLANGIOPANCREATOGRAPHY (ERCP) WITH PROPOFOL ;  Surgeon: Rollin Dover, MD;  [facility];  Service: Endoscopy;  Laterality: N/A;   ENDOSCOPIC RETROGRADE CHOLANGIOPANCREATOGRAPHY (ERCP) WITH PROPOFOL  N/A 01/01/2019   Procedure: ENDOSCOPIC RETROGRADE CHOLANGIOPANCREATOGRAPHY (ERCP) WITH PROPOFOL ;  Surgeon: Rollin Dover, MD;  [facility];  Service: Endoscopy;  Laterality: N/A;   ERCP  12/25/2018   FINE NEEDLE ASPIRATION N/A 01/01/2019   Procedure: FINE NEEDLE ASPIRATION (  FNA) LINEAR;  Surgeon: Rollin Dover, MD;  Location: [facility];  Service: Endoscopy;  Laterality: N/A;   LAPAROSCOPY N/A 03/04/2019   Procedure: LAPAROSCOPY  DIAGNOSTIC;  Surgeon: Aron Shoulders, MD;  [facility];  Service: General;  Laterality: N/A;  GENERAL AND EPIDURAL   PARTIAL NEPHRECTOMY Right 03/04/2019   Procedure: Nephrectomy Partial;  Surgeon: Sherrilee Dover CROME, MD;  Location: [facility];  Service: Urology;  Laterality: Right;   PORTACATH PLACEMENT Left 04/21/2019   Procedure: INSERTION PORT-A-CATH;  Surgeon: Aron Shoulders, MD;  [facility];  Service: General;  Laterality: Left;   SPHINCTEROTOMY  12/25/2018   Procedure: ANNETT;  Surgeon: Rollin Dover, MD;  [facility];  Service: Endoscopy;;   STENT REMOVAL  01/01/2019   Procedure: STENT REMOVAL;  Surgeon: Rollin Dover, MD;  [facility];  Service: Endoscopy;;   UPPER ESOPHAGEAL ENDOSCOPIC ULTRASOUND (EUS) N/A 01/01/2019   Procedure: UPPER ESOPHAGEAL ENDOSCOPIC ULTRASOUND (EUS);  Surgeon: Rollin Dover, MD;  Location: [facility];  Service: Endoscopy;  Laterality: N/A;   VIDEO BRONCHOSCOPY WITH ENDOBRONCHIAL NAVIGATION Bilateral 01/30/2021   Procedure: VIDEO BRONCHOSCOPY WITH ENDOBRONCHIAL NAVIGATION;  Surgeon: Brenna Adine CROME, DO;  [facility];  Service: Pulmonary;  Laterality: Bilateral;  ION   VIDEO BRONCHOSCOPY WITH RADIAL ENDOBRONCHIAL ULTRASOUND  01/30/2021   Procedure: RADIAL ENDOBRONCHIAL ULTRASOUND;  Surgeon: Brenna Adine CROME, DO;  [facility];  Service: Pulmonary;;   WHIPPLE PROCEDURE N/A 03/04/2019   Procedure: WHIPPLE PROCEDURE;  Surgeon: Aron Shoulders, MD;  Location: [facility];  Service: General;  Laterality: N/A;  GENERAL AND EPIDURAL    HEMATOLOGY/ONCOLOGY HISTORY:  Oncology History  Pancreatic cancer (HCC)  03/02/2019 Genetic Testing   TERT c.483G>A VUS identified on the multicancer panel.  The Multi-Gene Panel offered by [facility] includes sequencing and/or deletion duplication testing of the following 85 genes: AIP, ALK, APC, ATM, AXIN2,BAP1,  BARD1, BLM, BMPR1A, BRCA1, BRCA2, BRIP1, CASR, CDC73, CDH1, CDK4, CDKN1B,  CDKN1C, CDKN2A (p14ARF), CDKN2A (p16INK4a), CEBPA, CHEK2, CTNNA1, DICER1, DIS3L2, EGFR (c.2369C>T, p.Thr790Met variant only), EPCAM (Deletion/duplication testing only), FH, FLCN, GATA2, GPC3, GREM1 (Promoter region deletion/duplication testing only), HOXB13 (c.251G>A, p.Gly84Glu), HRAS, KIT, MAX, MEN1, MET, MITF (c.952G>A, p.Glu318Lys variant only), MLH1, MSH2, MSH3, MSH6, MUTYH, NBN, NF1, NF2, NTHL1, PALB2, PDGFRA, PHOX2B, PMS2, POLD1, POLE, POT1, PRKAR1A, PTCH1, PTEN, RAD50, RAD51C, RAD51D, RB1, RECQL4, RET, RNF43, RUNX1, SDHAF2, SDHA (sequence changes only), SDHB, SDHC, SDHD, SMAD4, SMARCA4, SMARCB1, SMARCE1, STK11, SUFU, TERC, TERT, TMEM127, TP53, TSC1, TSC2, VHL, WRN and WT1.  The report date is February 19, 2019.  UPDATE: TERT c.483G>A (Silent) VUS has been reclassified as Likely Benign.  The amended report date is February 07, 2021.   03/04/2019 Initial Diagnosis   Pancreatic cancer (HCC)   03/30/2019 Cancer Staging   Staging form: Exocrine Pancreas, AJCC 8th Edition - Pathologic: Stage III (pT3, pN2, cM0) - Signed by Cloretta Arley NOVAK, MD on 03/30/2019   04/27/2019 - 08/05/2019 Chemotherapy         03/28/2021 - 01/31/2022 Chemotherapy   Patient is on Treatment Plan : PANCREATIC Abraxane  / Gemcitabine  D1,8,15 q28d     03/28/2021 - 03/27/2023 Chemotherapy   Patient is on Treatment Plan : PANCREATIC Abraxane  D1,8,15 + Gemcitabine  D1,8,15 q28d     05/21/2023 -  Chemotherapy   Patient is on Treatment Plan : PANCREAS Liposomal Irinotecan  + Leucovorin  + 5-FU IVCI q14d       ALLERGIES:  is allergic to cherry and lemon oil.  MEDICATIONS:  Current Outpatient Medications  Medication Sig Dispense Refill  apixaban  (ELIQUIS ) 5 MG TABS tablet Take 1 tablet (5 mg total) by mouth 2 (two) times daily. 180 tablet 0   benzonatate  (TESSALON ) 200 MG capsule Take 1 capsule (200 mg total) by mouth 3 (three) times daily as needed. 20 capsule 0   cetirizine (ZYRTEC) 10 MG tablet Take 10 mg by mouth  daily.     chlorpheniramine-HYDROcodone  (TUSSIONEX) 10-8 MG/5ML Take 2.5-5 mLs by mouth every 12 (twelve) hours as needed for cough. Do not drive while taking 70 mL 0   Continuous Blood Gluc Receiver (FREESTYLE LIBRE 14 DAY READER) DEVI USE READER TO CHECK BLOOD GLUCOSE WITH FREESTYLE LIBRE SENSORS. 1 Device 0   Continuous Blood Gluc Sensor (FREESTYLE LIBRE 2 SENSOR) MISC APPLY EVERY 14 DAYS 2 each 5   fluconazole  (DIFLUCAN ) 150 MG tablet Take 1 tablet (150 mg total) by mouth daily. 1 tablet 0   glucose blood test strip Check sugar 2 times daily. 100 each 12   Insulin  Degludec (TRESIBA  FLEXTOUCH [place]) Inject into the skin. 12-18 Units     insulin  lispro (HUMALOG  KWIKPEN) 200 UNIT/ML KwikPen 6 units with breakfast, 13 units with lunch, 16 units with dinner     Insulin  Pen Needle (BD PEN NEEDLE NANO U/F) 32G X 4 MM MISC USE TO INJECT INSULIN  THREE TIMES A DAY 270 each 3   lidocaine -prilocaine  (EMLA ) cream Apply 1 Application topically as directed. Apply 1 hour prior to stick and cover with plastic wrap (Patient not taking: Reported on 06/18/2023) 30 g 5   magic mouthwash (nystatin , diphenhydrAMINE , alum & mag hydroxide) suspension mixture Take 5 mLs by mouth 4 (four) times daily as needed for mouth pain. 240 mL 1   magic mouthwash SOLN Take 5 mLs by mouth 4 (four) times daily as needed for mouth pain. (Patient not taking: Reported on 12/19/2022) 240 mL 0   magnesium  oxide (MAG-OX) 400 (240 Mg) MG tablet TAKE 1 TABLET(400 MG) BY MOUTH TWICE DAILY 180 tablet 1   metroNIDAZOLE  (METROCREAM ) 0.75 % cream Apply 1 Application topically 2 (two) times daily. To face (Patient not taking: Reported on 12/17/2022)     ondansetron  (ZOFRAN ) 8 MG tablet Take 1 tablet (8 mg total) by mouth every 8 (eight) hours as needed for nausea or vomiting (do not begin until 72 hours after chemo). (Patient not taking: Reported on 06/18/2023) 20 tablet 3   potassium chloride  SA (KLOR-CON  M) 20 MEQ tablet Take 1 tablet (20 mEq total) by mouth  3 (three) times daily. 90 tablet 1   prochlorperazine  (COMPAZINE ) 10 MG tablet TAKE 1 TABLET(10 MG) BY MOUTH EVERY 6 HOURS AS NEEDED FOR NAUSEA OR VOMITING (Patient not taking: Reported on 06/18/2023) 60 tablet 2   rosuvastatin  (CRESTOR ) 10 MG tablet TAKE 1 TABLET AT BEDTIME 90 tablet 3   spironolactone  (ALDACTONE ) 25 MG tablet TAKE 1 TABLET(25 MG) BY MOUTH DAILY 90 tablet 2   tretinoin (RETIN-A) 0.05 % cream Apply topically at bedtime. (Patient not taking: Reported on 06/18/2023)     valsartan  (DIOVAN ) 160 MG tablet TAKE 1 TABLET BY MOUTH TWICE DAILY 180 tablet 1   No current facility-administered medications for this visit.   Facility-Administered Medications Ordered in Other Visits  Medication Dose Route Frequency Provider Last Rate Last Admin   sodium chloride  flush (NS) 0.9 % injection 10 mL  10 mL Intravenous PRN [name], Lisa K, NP   10 mL at 10/24/22 1311    VITAL SIGNS: BP 130/88 (BP Location: Right Arm, Patient Position: Sitting)   Pulse  94   Temp (!) 97.3 F (36.3 C)   Resp 16   Wt 172 lb 6.4 oz (78.2 kg)   LMP 09/17/2012   SpO2 100%   BMI 26.21 kg/m  Filed Weights   06/24/23 0846  Weight: 172 lb 6.4 oz (78.2 kg)    Estimated body mass index is 26.21 kg/m as calculated from the following:   Height as of 06/18/23: 5' 8 (1.727 m).   Weight as of this encounter: 172 lb 6.4 oz (78.2 kg).  LABS: CBC:    Component Value Date/Time   WBC 5.4 06/18/2023 0928   WBC 5.9 01/27/2023 0833   HGB 8.5 (L) 06/18/2023 0928   HGB 11.8 07/31/2020 1113   HGB 11.6 07/15/2014 1048   HCT 27.9 (L) 06/18/2023 0928   HCT 36.8 07/31/2020 1113   HCT 35.8 07/15/2014 1048   PLT 642 (H) 06/18/2023 0928   PLT 340 07/31/2020 1113   MCV 93.3 06/18/2023 0928   MCV 95 07/31/2020 1113   MCV 88.6 07/15/2014 1048   NEUTROABS 3.5 06/18/2023 0928   NEUTROABS 2.4 07/15/2014 1048   LYMPHSABS 1.3 06/18/2023 0928   LYMPHSABS 2.4 07/15/2014 1048   MONOABS 0.4 06/18/2023 0928   MONOABS 0.3 07/15/2014  1048   EOSABS 0.1 06/18/2023 0928   EOSABS 0.2 07/15/2014 1048   BASOSABS 0.1 06/18/2023 0928   BASOSABS 0.0 07/15/2014 1048   Comprehensive Metabolic Panel:    Component Value Date/Time   NA 142 06/18/2023 0928   NA 141 08/29/2021 0000   NA 141 07/15/2014 1048   K 3.5 06/18/2023 0928   K 3.9 07/15/2014 1048   CL 107 06/18/2023 0928   CO2 30 06/18/2023 0928   CO2 27 07/15/2014 1048   BUN 9 06/18/2023 0928   BUN 6 08/29/2021 0000   BUN 16.6 07/15/2014 1048   CREATININE 0.80 06/18/2023 0928   CREATININE 0.9 07/15/2014 1048   GLUCOSE 103 (H) 06/18/2023 0928   GLUCOSE 169 (H) 07/15/2014 1048   CALCIUM  8.3 (L) 06/18/2023 0928   CALCIUM  9.2 07/15/2014 1048   AST 14 (L) 06/18/2023 0928   AST 16 07/15/2014 1048   ALT 8 06/18/2023 0928   ALT 15 07/15/2014 1048   ALKPHOS 122 06/18/2023 0928   ALKPHOS 62 07/15/2014 1048   BILITOT 0.4 06/18/2023 0928   BILITOT 0.42 07/15/2014 1048   PROT 7.0 06/18/2023 0928   PROT 7.1 07/31/2020 1113   PROT 7.4 07/15/2014 1048   ALBUMIN  2.6 (L) 06/18/2023 0928   ALBUMIN  4.1 07/31/2020 1113   ALBUMIN  3.7 07/15/2014 1048    RADIOGRAPHIC STUDIES: DG Chest 2 View Result Date: 06/08/2023 CLINICAL DATA:  Cough for 1 week. EXAM: CHEST - 2 VIEW COMPARISON:  10/22/2021. FINDINGS: Elevated right hemidiaphragm noted. There are multiple ring-like opacities throughout bilateral lungs, which corresponds to multiple cavitary metastatic lesions on the recent CT scan chest from 04/21/2023. No acute consolidation or lung collapse. Bilateral costophrenic angles are clear. Normal cardio-mediastinal silhouette. No acute osseous abnormalities. The soft tissues are within normal limits. Left-sided CT Port-A-Cath is seen with its tip overlying the upper portion of superior vena cava. IMPRESSION: *No acute cardiopulmonary disease. Redemonstration of multiple cavitary lung metastases. Electronically Signed   By: Ree Molt M.D.   On: 06/08/2023 09:38    PERFORMANCE  STATUS (ECOG) : 1 - Symptomatic but completely ambulatory  Review of Systems  Constitutional:  Positive for activity change, appetite change and fatigue.  Unless otherwise noted, a complete review of systems  is negative.  Physical Exam General: NAD Cardiovascular: regular rate and rhythm Pulmonary: Normal breathing pattern  Neurological: Alert and oriented x3  IMPRESSION: This is Alyssa Ross's initial clinic visit. No acute distress. No family present. Patient is ambulatory. Alert and able to engage in discussions appropriately.   I introduced myself, Maygan RN, and Palliative's role in collaboration with the oncology team. Concept of Palliative Care was introduced as specialized medical care for people and their families living with serious illness. It focuses on providing relief from the symptoms and stress of a serious illness. The goal is to improve quality of life for both the patient and the family. Values and goals of care important to patient and family were attempted to be elicited.   Ms. Huxford works as a [employer]. She lives alone, no children, close support from two sisters, has been battling pancreatic cancer for nearly five years. She continues to actively work. Christian faith. The patient is able to perform all ADLs independently however with limitations due to ongoing fatigue.   The patient reports a significant decline in quality of life, with difficulties in maintaining nutrition and hydration due to a lack of appetite and frequent episodes of nausea and diarrhea. Reports constipation after taking Imodium to manage diarrhea related to treatment. Her work and social activities have been significantly reduced, with entire weekends spent in bed. Most recently she has also required blood transfusions due to low hemoglobin.  The patient denies experiencing any pain but reports a sensation of food getting stuck in the upper gastrointestinal tract after eating. This is not a usual  symptom, and she is associating this feeling after eating beans on yesterday that was followed by several days of minimal appetite and intake. The patient also experiences anticipatory nausea associated with chemotherapy treatments and reports being able to smell the chemotherapy for days after treatment, which exacerbates the nausea. She has antiemetics on hand for support but does not require regularly.   Ms. Denardo appetite varies, with two to three meals consumed on a good day and zero to one meal on days around treatment. The patient has lost ten pounds since November and has not been consuming any protein drinks like Ensure or Boost due to their propensity to induce vomiting. She has actively been involved with a Dietician. We discussed foods high in protein and iron enriches. Encouraged to eat small frequent meals versus larger meals. Aleeta also reports neuropathy in both hands and feet, more pronounced in the feet. She is not interested in medications such as gabapentin  or Lyrica at this time. We discussed use of topical creams such as Nervive for some relief with understanding not a guaranteed response.   The patient has been managing these symptoms without any specific medications for nausea or pain, preferring to wait out the discomfort. She has expressed concerns about the declining quality of life and the impact of the ongoing treatment on her energy levels, which are reported to be quite low. Also reports a decrease in her energy levels following treatment, which does not return to normal levels even after recovery from the treatment.  We discussed her current illness and what it means in the larger context of her on-going co-morbidities. Natural disease trajectory and expectations were discussed.  Ms. kemisha bonnette understanding and realistic expectations in regards to her cancer diagnosis/treatment plan. She is clear in her expressed wishes to continue to treat the treatable with a main  focus of ensuring her quality of life is sufficient  as this is most important to her.   I empathetically approached discussions regarding health care limitations, advanced directives, and patient's wishes. She expresses although she does not have documented directives she has discussed with her sisters and they are clear in what her wishes would. Ms. Rossy states her sisters would be her medical decision makers. Her desire is for a natural death if no quality of life. No long-term artificial feedings/hydration. If her quality of life is not sufficient and she is not able to care for herself she would then desire to be kept comfortable for what time she had left.   The MOST form was introduced and discussed. Patient states she has advanced directive packet on hand at home. Education provided on AD clinic here at the cancer center.   Hospice and Palliative Care services outpatient were explained as requested by patient. Discussions had regarding what care would look like in the home with hospice care in addition to inpatient hospice facility. The patient is clear her desire is to be at home at home for as long as she can with no desire to pass away at home if possible. At that time she would want to transfer to an inpatient facility. Ms. Ekdahl verbalized understanding and awareness of both palliative and hospice's goals and philosophy of care.   I discussed the importance of continued conversation with family and their medical providers regarding overall plan of care and treatment options, ensuring decisions are within the context of the patients values and GOCs.  PLAN: Established therapeutic relationship. Education provided on palliative's role in collaboration with their Oncology/Radiation team.  Pancreatic Cancer Patient has been battling this for over four years. Currently experiencing issues with nutrition and hydration due to lack of appetite and gastrointestinal symptoms. No pain  reported. -Continue current treatment plan per Oncology.  -Consider B12. Will obtain baseline at next office visit prior to considering. Per discussions with Oncology decisions to not start Folic Acid given decrease effectiveness with treatment regimen.  -Consider referral to a therapist for neuropathy management if symptoms worsen. -Patient has been actively involved with Dietician for appetite and nutrition management.  Chemotherapy-Induced Diarrhea Patient experiences diarrhea post-treatment, managed with Imodium. However, this leads to constipation and discomfort. -Atropine  during treatment to potentially reduce severity of diarrhea per Oncology.  -Continue use of Imodium as needed.  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 -Consider B12 and folic acid supplementation to potentially help with fatigue related to anemia, pending discussions with Oncology team.   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 -Consider referral to a therapist for neuropathy management if symptoms worsen. -Consider Alpha Lipoic Acid supplementation, pending discussion with oncologist. Advised per pharmacy comparison could potentially decrease effectiveness of Camptosar .   Goals of Care/End of Life Care Planning Patient has been informed about the options for palliative care and hospice, and the process of transitioning between the two. -Continue to provide information and support as needed. -Consider initiating advanced directives and healthcare power of attorney at advanced directive clinic. -MOST form introduced, and education provided. Will complete at upcoming appointments at patient's convenience. -Extensive education provided on palliative and hospice's philosophy of care.    Follow-up in six weeks. Patient knows to contact office sooner if needed.   Patient expressed understanding and was in  agreement with this plan. She also understands that She can call the clinic at any time with any questions, concerns, or complaints.   Thank you for your  referral and allowing Palliative to assist in Mrs. Orel Keir's care.   Number and complexity of problems addressed: HIGH - 1 or more chronic illnesses with SEVERE exacerbation, progression, or side effects of treatment - advanced cancer, pain. Any controlled substances utilized were prescribed in the context of palliative care.   Visit consisted of counseling and education dealing with the complex and emotionally intense issues of symptom management and palliative care in the setting of serious and potentially life-threatening illness.  Signed by: Levon Borer, AGPCNP-BC Palliative Medicine Team/[facility]

## 2023-06-25 NOTE — Progress Notes (Signed)
 This encounter was created in error - please disregard.

## 2023-06-26 ENCOUNTER — Encounter: Payer: Self-pay | Admitting: Oncology

## 2023-06-28 ENCOUNTER — Other Ambulatory Visit: Payer: Self-pay | Admitting: Oncology

## 2023-07-02 ENCOUNTER — Inpatient Hospital Stay: Payer: BC Managed Care – PPO

## 2023-07-02 ENCOUNTER — Inpatient Hospital Stay (HOSPITAL_BASED_OUTPATIENT_CLINIC_OR_DEPARTMENT_OTHER): Payer: BC Managed Care – PPO | Admitting: Oncology

## 2023-07-02 VITALS — BP 122/71 | HR 91 | Temp 98.1°F | Resp 18 | Ht 68.0 in | Wt 177.9 lb

## 2023-07-02 VITALS — BP 115/81 | HR 70

## 2023-07-02 DIAGNOSIS — C7802 Secondary malignant neoplasm of left lung: Secondary | ICD-10-CM | POA: Diagnosis not present

## 2023-07-02 DIAGNOSIS — C250 Malignant neoplasm of head of pancreas: Secondary | ICD-10-CM

## 2023-07-02 DIAGNOSIS — C259 Malignant neoplasm of pancreas, unspecified: Secondary | ICD-10-CM | POA: Diagnosis not present

## 2023-07-02 DIAGNOSIS — Z5111 Encounter for antineoplastic chemotherapy: Secondary | ICD-10-CM | POA: Diagnosis not present

## 2023-07-02 DIAGNOSIS — E876 Hypokalemia: Secondary | ICD-10-CM

## 2023-07-02 DIAGNOSIS — E1165 Type 2 diabetes mellitus with hyperglycemia: Secondary | ICD-10-CM

## 2023-07-02 DIAGNOSIS — C7801 Secondary malignant neoplasm of right lung: Secondary | ICD-10-CM | POA: Diagnosis not present

## 2023-07-02 LAB — CBC WITH DIFFERENTIAL (CANCER CENTER ONLY)
Abs Immature Granulocytes: 0.02 10*3/uL (ref 0.00–0.07)
Basophils Absolute: 0.1 10*3/uL (ref 0.0–0.1)
Basophils Relative: 1 %
Eosinophils Absolute: 0.2 10*3/uL (ref 0.0–0.5)
Eosinophils Relative: 4 %
HCT: 26.3 % — ABNORMAL LOW (ref 36.0–46.0)
Hemoglobin: 8.3 g/dL — ABNORMAL LOW (ref 12.0–15.0)
Immature Granulocytes: 0 %
Lymphocytes Relative: 21 %
Lymphs Abs: 1 10*3/uL (ref 0.7–4.0)
MCH: 29.3 pg (ref 26.0–34.0)
MCHC: 31.6 g/dL (ref 30.0–36.0)
MCV: 92.9 fL (ref 80.0–100.0)
Monocytes Absolute: 0.4 10*3/uL (ref 0.1–1.0)
Monocytes Relative: 8 %
Neutro Abs: 3.3 10*3/uL (ref 1.7–7.7)
Neutrophils Relative %: 66 %
Platelet Count: 354 10*3/uL (ref 150–400)
RBC: 2.83 MIL/uL — ABNORMAL LOW (ref 3.87–5.11)
RDW: 18 % — ABNORMAL HIGH (ref 11.5–15.5)
WBC Count: 5 10*3/uL (ref 4.0–10.5)
nRBC: 0 % (ref 0.0–0.2)

## 2023-07-02 LAB — CMP (CANCER CENTER ONLY)
ALT: 13 U/L (ref 0–44)
AST: 16 U/L (ref 15–41)
Albumin: 2.9 g/dL — ABNORMAL LOW (ref 3.5–5.0)
Alkaline Phosphatase: 173 U/L — ABNORMAL HIGH (ref 38–126)
Anion gap: 5 (ref 5–15)
BUN: 6 mg/dL (ref 6–20)
CO2: 26 mmol/L (ref 22–32)
Calcium: 8 mg/dL — ABNORMAL LOW (ref 8.9–10.3)
Chloride: 110 mmol/L (ref 98–111)
Creatinine: 0.92 mg/dL (ref 0.44–1.00)
GFR, Estimated: >60 mL/min (ref >60)
Glucose, Bld: 80 mg/dL (ref 70–99)
Potassium: 2.9 mmol/L — ABNORMAL LOW (ref 3.5–5.1)
Sodium: 141 mmol/L (ref 135–145)
Total Bilirubin: 0.4 mg/dL (ref 0.0–1.2)
Total Protein: 6.3 g/dL — ABNORMAL LOW (ref 6.5–8.1)

## 2023-07-02 LAB — HEMOGLOBIN A1C
Hgb A1c MFr Bld: 8.5 % — ABNORMAL HIGH (ref 4.8–5.6)
Mean Plasma Glucose: 197.25 mg/dL

## 2023-07-02 LAB — MAGNESIUM: Magnesium: 1.5 mg/dL — ABNORMAL LOW (ref 1.7–2.4)

## 2023-07-02 MED ORDER — DEXAMETHASONE SODIUM PHOSPHATE 10 MG/ML IJ SOLN
10.0000 mg | Freq: Once | INTRAMUSCULAR | Status: AC
  Administered 2023-07-02: 10 mg via INTRAVENOUS
  Filled 2023-07-02: qty 1

## 2023-07-02 MED ORDER — PALONOSETRON HCL INJECTION 0.25 MG/5ML
0.2500 mg | Freq: Once | INTRAVENOUS | Status: AC
  Administered 2023-07-02: 0.25 mg via INTRAVENOUS
  Filled 2023-07-02: qty 5

## 2023-07-02 MED ORDER — SODIUM CHLORIDE 0.9 % IV SOLN
INTRAVENOUS | Status: DC
Start: 2023-07-02 — End: 2023-07-02

## 2023-07-02 MED ORDER — SODIUM CHLORIDE 0.9 % IV SOLN
400.0000 mg/m2 | Freq: Once | INTRAVENOUS | Status: AC
  Administered 2023-07-02: 788 mg via INTRAVENOUS
  Filled 2023-07-02: qty 39.4

## 2023-07-02 MED ORDER — FLUOROURACIL CHEMO INJECTION 5 GM/100ML
2400.0000 mg/m2 | INTRAVENOUS | Status: DC
  Administered 2023-07-02: 5000 mg via INTRAVENOUS
  Filled 2023-07-02: qty 100

## 2023-07-02 MED ORDER — MAGNESIUM SULFATE 2 GM/50ML IV SOLN
2.0000 g | Freq: Once | INTRAVENOUS | Status: AC
  Administered 2023-07-02: 2 g via INTRAVENOUS
  Filled 2023-07-02: qty 50

## 2023-07-02 MED ORDER — SODIUM CHLORIDE 0.9 % IV SOLN
55.0000 mg/m2 | Freq: Once | INTRAVENOUS | Status: AC
  Administered 2023-07-02: 107.5 mg via INTRAVENOUS
  Filled 2023-07-02: qty 25

## 2023-07-02 MED ORDER — POTASSIUM CHLORIDE 10 MEQ/100ML IV SOLN
10.0000 meq | INTRAVENOUS | Status: AC
  Administered 2023-07-02 (×2): 10 meq via INTRAVENOUS
  Filled 2023-07-02: qty 100

## 2023-07-02 MED ORDER — ATROPINE SULFATE 1 MG/ML IV SOLN
0.5000 mg | Freq: Once | INTRAVENOUS | Status: AC | PRN
  Administered 2023-07-02: 0.5 mg via INTRAVENOUS
  Filled 2023-07-02: qty 1

## 2023-07-02 NOTE — Progress Notes (Signed)
[facility] OFFICE PROGRESS NOTE   Diagnosis: Pancreas cancer  INTERVAL HISTORY:   Ms. Goodspeed completed another cycle of 5-FU/liposomal irinotecan on 06/18/2023.  She reports 1 episode of diarrhea following chemotherapy.  She took Imodium and then developed constipation.  Her appetite has been poor over the 2 weeks following chemotherapy.  She reports stable neuropathy symptoms.  Objective:  Vital signs in last 24 hours:  Blood pressure 122/71, pulse 91, temperature 98.1 F (36.7 C), temperature source Temporal, resp. rate 18, height 5\' 8"  (1.727 m), weight 177 lb 14.4 oz (80.7 kg), last menstrual period 09/17/2012, SpO2 100%.    HEENT: No thrush or ulcers Resp: Lungs clear bilaterally Cardio: Regular rate and rhythm GI: No hepatosplenomegaly, no mass, nontender Vascular: No leg edema   Portacath/PICC-without erythema  Lab Results:  Lab Results  Component Value Date   WBC 5.0 07/02/2023   HGB 8.3 (L) 07/02/2023   HCT 26.3 (L) 07/02/2023   MCV 92.9 07/02/2023   PLT 354 07/02/2023   NEUTROABS 3.3 07/02/2023    CMP  Lab Results  Component Value Date   NA 141 07/02/2023   K 2.9 (L) 07/02/2023   CL 110 07/02/2023   CO2 26 07/02/2023   GLUCOSE 80 07/02/2023   BUN 6 07/02/2023   CREATININE 0.92 07/02/2023   CALCIUM 8.0 (L) 07/02/2023   PROT 6.3 (L) 07/02/2023   ALBUMIN 2.9 (L) 07/02/2023   AST 16 07/02/2023   ALT 13 07/02/2023   ALKPHOS 173 (H) 07/02/2023   BILITOT 0.4 07/02/2023   GFRNONAA >60 07/02/2023   GFRAA >60 02/07/2020    Lab Results  Component Value Date   [id number] 398 (H) 06/18/2023    Medications: I have reviewed the patient's current medications.   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 Tumor invades the duodenal wall and vascular groove, resection margins negative, 4/34 lymph nodes positive MSI-stable, tumor showed instability in 2 loci as did adjacent normal  tissue Foundation 1-K-ras G12 V, microsatellite status and tumor mutation burden cannot be determined EUS FNA biopsy of pancreas mass on 07/03/2018-well-differentiated neuroendocrine tumor CTs 01/29/2019-ill-defined pancreas head mass, 5 pulmonary nodules-1 with a small amount of central cavitation, tumor abuts the left margin of the portal vein indistinct density surrounding, hepatic artery, complex cystic lesion of the right kidney, right adrenal mass-characterized as an adenoma on a [facility] MRI 12/21/2018 [facility] 03/03/2019-no focal pancreas activity, no tracer accumulation within the suspicious pulmonary nodules, left uterine mass with tracer accumulation felt to represent a leiomyoma Elevated preoperative CA 19-9--CA 19-9 186 on 01/18/2019 CT chest 04/16/2019-multiple bilateral pulmonary nodules, some with increased cavitation, stable in size Cycle 1 FOLFIRINOX 04/27/2019 Cycle 2 FOLFIRINOX 05/11/2019 Cycle 3 FOLFIRINOX 05/23/2019 Cycle 4 FOLFIRINOX 06/08/2019 Cycle 5 FOLFIRINOX 06/22/2019 CT chest 07/02/2019-stable size of bilateral pulmonary nodules.  Dominant cavitary lesions in both lungs show increased cavitation with thinner walls.  Stable 2.1 cm right adrenal nodule. 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 No definite evidence of metastatic disease in the abdomen or pelvis. Markedly enlarged and heterogeneous appearing uterus, likely to represent multifocal fibroids. 1 of these lesions appears to be an exophytic subserosal fibroid  in the posterior lateral aspect of the uterine body  on the left side although this comes in close proximity to the left adnexa such that a primary ovarian lesion is difficult to completely exclude. CTs 02/07/2020-slight enlargement of bilateral lung nodules, some are cavitary, no evidence of metastatic disease in the abdomen or pelvis, stable right adrenal nodule, uterine fibroids CTs 04/26/2020-mild enlargement of pulmonary nodules, slight increase in trace pelvic fluid, new soft tissue thickening inferior to the cecal tip suspicious for peritoneal metastasis CT 05/26/2020-improved appearance of soft tissue at the inferior tip of the cecum, mildly thickened short appendix-findings suggestive of resolving appendicitis, stable small bibasilar pulmonary nodules, fibroids Plan biopsy of right cecal tip soft tissue canceled secondary to radiologic improvement CT chest 08/02/2020-enlargement and progressive cavitation of multiple bilateral lung nodules.  Some new nodules are present. CTs 10/24/2020- increase in size of pulmonary nodules, no new nodules, no evidence of metastatic disease in the abdomen, stable right adrenal nodule CT 01/09/2021-slight interval enlargement of pulmonary nodules, stable right adrenal nodule Navigation bronchoscopy 01/30/2021-left lower lobe cavitary nodule FNA-adenocarcinoma, brushing-adenocarcinoma.  Left lower lobe lavage-adenocarcinoma.  Right upper lobe brushing and FNA biopsy of cavitary nodule-adenocarcinoma-immunohistochemical profile consistent with pancreas adenocarcinoma Cycle 1 gemcitabine/Abraxane 03/28/2021 Cycle 2 gemcitabine/Abraxane 04/11/2021 Cycle 3 gemcitabine/Abraxane 04/25/2021 Cycle 4 gemcitabine/Abraxane 05/09/2021 Cycle 5 gemcitabine/Abraxane 05/23/2021 CT chest 06/05/2021-interval cavitation of multiple pulmonary nodules, some nodules have decreased in size, no new or enlarging nodules Cycle 6 gemcitabine/Abraxane 06/06/2021 Cycle 7 gemcitabine/Abraxane 06/21/2021 Cycle 8 gemcitabine/Abraxane 07/05/2021 Cycle  9 Gemcitabine/Abraxane 07/19/2021 Cycle 10 gemcitabine 08/01/2021-Abraxane held secondary to neuropathy CT chest 08/13/2021-mild decrease in size and wall thickness of multiple cavitary nodules, no new or progressive disease in the chest, indeterminate low-attenuation right liver lesions Cycle 11 gemcitabine 08/16/2021-Abraxane held secondary to neuropathy Cycle 12 gemcitabine 08/30/2021-Abraxane held secondary to neuropathy Cycle 13 gemcitabine 09/13/2021-Abraxane held secondary to neuropathy Cycle 14 gemcitabine 09/27/2021-Abraxane held secondary to neuropathy Cycle 15 gemcitabine 10/11/2021-Abraxane held secondary to neuropathy CTs 10/22/2021-no change in multiple cavitary lung nodules, no evidence of disease progression, ill-defined hypodense lesion in the posterior right liver suspicious for metastatic disease Cycle 16 gemcitabine 10/25/2021 Cycle 17 gemcitabine 11/08/2021 Cycle 18 Gemcitabine 11/22/2021 Cycle 19 gemcitabine 12/06/2021 Cycle 20 Gemcitabine 12/20/2021 CT 12/31/2021-mild increase in size of bilateral pulmonary metastases, stable subtle continuation right liver lesions Cycle 20 gemcitabine/Abraxane 01/03/2022 Cycle 21 gemcitabine/Abraxane 01/17/2022 Cycle 22 gemcitabine/Abraxane 01/31/2022 Cycle 23 gemcitabine/Abraxane 02/14/2022 Cycle 24 gemcitabine/Abraxane 02/28/2022 CTs 03/11/2022-widespread metastatic disease to the lungs again noted with slight involution of several of the pulmonary nodules, no definite new nodules noted; interval cavitation of solid lesion in the posterior aspect right lobe of the liver, no new liver lesions noted. Cycle 25 Gemcitabine/Abraxane 03/14/2022 Cycle 26 gemcitabine/Abraxane 03/28/2022 Cycle 27 gemcitabine/Abraxane 04/25/2022 Cycle 28 gemcitabine/Abraxane 05/09/2022 Cycle 29 Gemcitabine 05/23/2022, Abraxane held due to neuropathy CTs 06/04/2022-stable multifocal cavitary pulmonary nodules.  No definite new nodules.  No new nodules greater than a centimeter.   Decreased size of the right posterior hepatic lobe lesion.  Generalized heterogeneity and new focal areas of hypodensity in the liver. Cycle 30 Gemcitabine 06/06/2022, Abraxane held due to neuropathy Cycle 31 gemcitabine 06/20/2022, Abraxane held due to neuropathy Cycle 32 gemcitabine 07/04/2022, Abraxane held due to neuropathy Cycle 33 gemcitabine  07/18/2022 ,Abraxane held due to neuropathy Cycle 34 gemcitabine 08/15/2022, Abraxane held due to neuropathy Cycle 35 gemcitabine 08/29/2022, Abraxane held due to neuropathy CTs 09/10/2022-enlarging pulmonary metastases.  Subtle poorly defined hypoattenuating lesions in the liver, less well-seen than on 06/04/2022.  Suspected new lesion in the dome of the right hepatic lobe Cycle 36 gemcitabine/Abraxane 09/12/2022 Cycle 37 gemcitabine/Abraxane 09/26/2022 Cycle 38 gemcitabine/Abraxane 10/10/2022 Cycle 39 gemcitabine/Abraxane 11/07/2022 Cycle 40 gemcitabine/Abraxane 11/21/2022 Cycle 41 gemcitabine/Abraxane 12/05/2022 Cycle 42 gemcitabine/Abraxane 12/19/2022 CT is 12/30/2022-new left hepatic lesion, stable bilateral pulmonary metastases Cycle 43 gemcitabine/Abraxane 01/02/2023 CT-guided biopsy of liver lesion 01/27/2023-acute/chronic inflammation, negative for malignancy, culture negative Cycle 44 gemcitabine/Abraxane 01/30/2023 Cycle 45 gemcitabine/Abraxane 02/13/2023 Cycle 46 gemcitabine/Abraxane 02/27/2023 Cycle 47 gemcitabine/Abraxane 03/13/2023 Cycle 48 gemcitabine/Abraxane 03/27/2023 CTs 04/21/2023-numerous solid and cavitary lung nodules overall similar, some slightly enlarged; multiple new and enlarging rim-enhancing liver lesions. 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  Apixaban 10/24/2022      Disposition: Alyssa Ross appears stable.  She will complete cycle three 5-FU/liposomal irinotecan today.  We will follow-up on the CA 19-9 from today.  She will return for an office visit and chemotherapy in 2 weeks.  She will take Imodium if she has more than 1 episode of diarrhea.  She will try MiraLAX for constipation.  She will receive potassium and magnesium supplementation today.  Thornton Papas, MD  07/02/2023  10:51 AM

## 2023-07-02 NOTE — Progress Notes (Signed)
Patient seen by Dr. Thornton Papas today  Vitals are within treatment parameters:Yes   Labs are within treatment parameters: No (Please specify and give further instructions.)  K+ 2.9 and Mg+ 1.5--OK to proceed w/tx  Treatment plan has been signed: Yes   Per physician team, Patient is ready for treatment. Please note the following modifications: Will receive 2 grams Mg+ and 20 meq K+ today.

## 2023-07-02 NOTE — Patient Instructions (Addendum)
[facility]   Discharge Instructions:The chemotherapy medication bag should finish at 46 hours, 96 hours, or 7 days. For example, if your pump is scheduled for 46 hours and it was put on at 4:00 p.m., it should finish at 2:00 p.m. the day it is scheduled to come off regardless of your appointment time.     Estimated time to finish at 2:30 Friday July 04, 2023.   If the display on your pump reads "Low Volume" and it is beeping, take the batteries out of the pump and come to the cancer center for it to be taken off.   If the pump alarms go off prior to the pump reading "Low Volume" then call [phone] and someone can assist you.  If the plunger comes out and the chemotherapy medication is leaking out, please use your home chemo spill kit to clean up the spill. Do NOT use paper towels or other household products.  If you have problems or questions regarding your pump, please call either [phone] (24 hours a day) or the cancer center Monday-Friday 8:00 a.m.- 4:30 p.m. at the clinic number and we will assist you. If you are unable to get assistance, then go to the nearest Emergency Department and ask the staff to contact the IV team for assistance.   Thank you for choosing [facility] to provide your oncology and hematology care.   If you have a lab appointment with the Cancer Center, please go directly to the Cancer Center and check in at the registration area.   Wear comfortable clothing and clothing appropriate for easy access to any Portacath or PICC line.   We strive to give you quality time with your provider. You may need to reschedule your appointment if you arrive late (15 or more minutes).  Arriving late affects you and other patients whose appointments are after yours.  Also, if you miss three or more appointments without notifying the office, you may be dismissed from the clinic at the provider's  discretion.      For prescription refill requests, have your pharmacy contact our office and allow 72 hours for refills to be completed.    Today you received the following chemotherapy and/or immunotherapy agents Irinotecan Liposome/Leucovorin/Fluorouracil/Magnesium/Potassium      To help prevent nausea and vomiting after your treatment, we encourage you to take your nausea medication as directed.  BELOW ARE SYMPTOMS THAT SHOULD BE REPORTED IMMEDIATELY: *FEVER GREATER THAN 100.4 F (38 C) OR HIGHER *CHILLS OR SWEATING *NAUSEA AND VOMITING THAT IS NOT CONTROLLED WITH YOUR NAUSEA MEDICATION *UNUSUAL SHORTNESS OF BREATH *UNUSUAL BRUISING OR BLEEDING *URINARY PROBLEMS (pain or burning when urinating, or frequent urination) *BOWEL PROBLEMS (unusual diarrhea, constipation, pain near the anus) TENDERNESS IN MOUTH AND THROAT WITH OR WITHOUT PRESENCE OF ULCERS (sore throat, sores in mouth, or a toothache) UNUSUAL RASH, SWELLING OR PAIN  UNUSUAL VAGINAL DISCHARGE OR ITCHING   Items with * indicate a potential emergency and should be followed up as soon as possible or go to the Emergency Department if any problems should occur.  Please show the CHEMOTHERAPY ALERT CARD or IMMUNOTHERAPY ALERT CARD at check-in to the Emergency Department and triage nurse.  Should you have questions after your visit or need to cancel or reschedule your appointment, please contact [facility]  Dept: [phone]  and follow the prompts.  Office hours  are 8:00 a.m. to 4:30 p.m. Monday - Friday. Please note that voicemails left after 4:00 p.m. may not be returned until the following business day.  We are closed weekends and major holidays. You have access to a nurse at all times for urgent questions. Please call the main number to the clinic Dept: [phone] and follow the prompts.   For any non-urgent questions, you may also contact your provider using MyChart. We  now offer e-Visits for anyone 5 and older to request care online for non-urgent symptoms. For details visit [email or web address].   Also download the MyChart app! Go to the app store, search "MyChart", open the app, select [place], and log in with your MyChart username and password.  Irinotecan Liposome Injection What is this medication? IRINOTECAN LIPOSOME (eye ri noe TEE kan LIP oh som) treats pancreatic cancer. It works by slowing down the growth of cancer cells. This medicine may be used for other purposes; ask your health care provider or pharmacist if you have questions. COMMON BRAND NAME(S): ONIVYDE What should I tell my care team before I take this medication? They need to know if you have any of these conditions: Blockage in your bowels Dehydration Infection Low white blood cell levels Lung disease An unusual or allergic reaction to irinotecan liposome, irinotecan, other medications, foods, dyes, or preservatives Pregnant or trying to get pregnant Breast-feeding How should I use this medication? This medication is injected into a vein. It is given by your care team in a hospital or clinic setting. Talk to your care team about the use of this medication in children. Special care may be needed. Overdosage: If you think you have taken too much of this medicine contact a poison control center or emergency room at once. NOTE: This medicine is only for you. Do not share this medicine with others. What if I miss a dose? Keep appointments for follow-up doses. It is important not to miss your dose. Call your care team if you are unable to keep an appointment. What may interact with this medication? Do not take this medication with any of the following: Itraconazole This medication may also interact with the following: Certain antivirals for HIV or AIDS Certain medications for seizures, such as carbamazepine, fosphenytoin, phenytoin,  phenobarbital Clarithromycin Gemfibrozil Nefazodone Rifabutin Rifampin Rifapentine St. John's Wort Voriconazole This list may not describe all possible interactions. Give your health care provider a list of all the medicines, herbs, non-prescription drugs, or dietary supplements you use. Also tell them if you smoke, drink alcohol, or use illegal drugs. Some items may interact with your medicine. What should I watch for while using this medication? This medication may make you feel generally unwell. This is not uncommon as chemotherapy can affect healthy cells as well as cancer cells. Report any side effects. Continue your course of treatment even though you feel ill unless your care team tells you to stop. You may need blood work while you are taking this medication. This medication can cause serious side effects and allergic reactions. To reduce your risk, your care team may give you other medications to take before receiving this one. Be sure to follow the directions from your care team. Check with your care team if you get an attack of diarrhea, nausea and vomiting, or if you sweat a lot. The loss of too much body fluid can make it dangerous for you to take this medication. This medication may cause infertility. Talk to your care team if you are concerned  about your fertility. Talk to your care team if you wish to become pregnant or if you think you might be pregnant. This medication can cause serious birth defects if taken during pregnancy or if you get pregnant within 7 months after stopping therapy. A negative pregnancy test is required before starting this medication. A reliable form of contraception is recommended while taking this medication and for 7 months after stopping it. Talk to your care team about reliable forms of contraception. Use a condom during sex and for 4 months after stopping therapy. Tell your care team right away if you think your partner might be pregnant. This  medication can cause serious birth defects. Do not breast-feed while taking this medication and for 1 month after stopping therapy. This medication may increase your risk of getting an infection. Call your care team for advice if you get a fever, chills, sore throat, or other symptoms of a cold or flu. Do not treat yourself. Try to avoid being around people who are sick. Avoid taking medications that contain aspirin, acetaminophen, ibuprofen, naproxen, or ketoprofen unless instructed by your care team. These medications may hide a fever. Be careful brushing or flossing your teeth or using a toothpick because you may get an infection or bleed more easily. If you have any dental work done, tell your dentist you are receiving this medication. What side effects may I notice from receiving this medication? Side effects that you should report to your care team as soon as possible: Allergic reactions or angioedema--skin rash, itching or hives, swelling of the face, eyes, lips, tongue, arms, or legs, trouble swallowing or breathing Dry cough, shortness of breath or trouble breathing Diarrhea Infection--fever, chills, cough, or sore throat Side effects that usually do not require medical attention (report to your care team if they continue or are bothersome): Fatigue Loss of appetite Nausea Pain, redness, or swelling with sores inside the mouth or throat Vomiting This list may not describe all possible side effects. Call your doctor for medical advice about side effects. You may report side effects to FDA at 1-800-FDA-1088. Where should I keep my medication? This medication is given in a hospital or clinic. It will not be stored at home. NOTE: This sheet is a summary. It may not cover all possible information. If you have questions about this medicine, talk to your doctor, pharmacist, or health care provider.  2024 Elsevier/Gold Standard (2021-07-26 00:00:00)  Leucovorin Injection What is this  medication? LEUCOVORIN (loo koe VOR in) prevents side effects from certain medications, such as methotrexate. It works by increasing folate levels. This helps protect healthy cells in your body. It may also be used to treat anemia caused by low levels of folate. It can also be used with fluorouracil, a type of chemotherapy, to treat colorectal cancer. It works by increasing the effects of fluorouracil in the body. This medicine may be used for other purposes; ask your health care provider or pharmacist if you have questions. What should I tell my care team before I take this medication? They need to know if you have any of these conditions: Anemia from low levels of vitamin B12 in the blood An unusual or allergic reaction to leucovorin, folic acid, other medications, foods, dyes, or preservatives Pregnant or trying to get pregnant Breastfeeding How should I use this medication? This medication is injected into a vein or a muscle. It is given by your care team in a hospital or clinic setting. Talk to your care team  about the use of this medication in children. Special care may be needed. Overdosage: If you think you have taken too much of this medicine contact a poison control center or emergency room at once. NOTE: This medicine is only for you. Do not share this medicine with others. What if I miss a dose? Keep appointments for follow-up doses. It is important not to miss your dose. Call your care team if you are unable to keep an appointment. What may interact with this medication? Capecitabine Fluorouracil Phenobarbital Phenytoin Primidone Trimethoprim;sulfamethoxazole This list may not describe all possible interactions. Give your health care provider a list of all the medicines, herbs, non-prescription drugs, or dietary supplements you use. Also tell them if you smoke, drink alcohol, or use illegal drugs. Some items may interact with your medicine. What should I watch for while using  this medication? Your condition will be monitored carefully while you are receiving this medication. This medication may increase the side effects of 5-fluorouracil. Tell your care team if you have diarrhea or mouth sores that do not get better or that get worse. What side effects may I notice from receiving this medication? Side effects that you should report to your care team as soon as possible: Allergic reactions--skin rash, itching, hives, swelling of the face, lips, tongue, or throat This list may not describe all possible side effects. Call your doctor for medical advice about side effects. You may report side effects to FDA at 1-800-FDA-1088. Where should I keep my medication? This medication is given in a hospital or clinic. It will not be stored at home. NOTE: This sheet is a summary. It may not cover all possible information. If you have questions about this medicine, talk to your doctor, pharmacist, or health care provider.  2024 Elsevier/Gold Standard (2021-10-30 00:00:00)  Fluorouracil Injection What is this medication? FLUOROURACIL (flure oh YOOR a sil) treats some types of cancer. It works by slowing down the growth of cancer cells. This medicine may be used for other purposes; ask your health care provider or pharmacist if you have questions. COMMON BRAND NAME(S): Adrucil What should I tell my care team before I take this medication? They need to know if you have any of these conditions: Blood disorders Dihydropyrimidine dehydrogenase (DPD) deficiency Infection, such as chickenpox, cold sores, herpes Kidney disease Liver disease Poor nutrition Recent or ongoing radiation therapy An unusual or allergic reaction to fluorouracil, other medications, foods, dyes, or preservatives If you or your partner are pregnant or trying to get pregnant Breast-feeding How should I use this medication? This medication is injected into a vein. It is administered by your care team in a  hospital or clinic setting. Talk to your care team about the use of this medication in children. Special care may be needed. Overdosage: If you think you have taken too much of this medicine contact a poison control center or emergency room at once. NOTE: This medicine is only for you. Do not share this medicine with others. What if I miss a dose? Keep appointments for follow-up doses. It is important not to miss your dose. Call your care team if you are unable to keep an appointment. What may interact with this medication? Do not take this medication with any of the following: Live virus vaccines This medication may also interact with the following: Medications that treat or prevent blood clots, such as warfarin, enoxaparin, dalteparin This list may not describe all possible interactions. Give your health care provider a list  of all the medicines, herbs, non-prescription drugs, or dietary supplements you use. Also tell them if you smoke, drink alcohol, or use illegal drugs. Some items may interact with your medicine. What should I watch for while using this medication? Your condition will be monitored carefully while you are receiving this medication. This medication may make you feel generally unwell. This is not uncommon as chemotherapy can affect healthy cells as well as cancer cells. Report any side effects. Continue your course of treatment even though you feel ill unless your care team tells you to stop. In some cases, you may be given additional medications to help with side effects. Follow all directions for their use. This medication may increase your risk of getting an infection. Call your care team for advice if you get a fever, chills, sore throat, or other symptoms of a cold or flu. Do not treat yourself. Try to avoid being around people who are sick. This medication may increase your risk to bruise or bleed. Call your care team if you notice any unusual bleeding. Be careful brushing  or flossing your teeth or using a toothpick because you may get an infection or bleed more easily. If you have any dental work done, tell your dentist you are receiving this medication. Avoid taking medications that contain aspirin, acetaminophen, ibuprofen, naproxen, or ketoprofen unless instructed by your care team. These medications may hide a fever. Do not treat diarrhea with over the counter products. Contact your care team if you have diarrhea that lasts more than 2 days or if it is severe and watery. This medication can make you more sensitive to the sun. Keep out of the sun. If you cannot avoid being in the sun, wear protective clothing and sunscreen. Do not use sun lamps, tanning beds, or tanning booths. Talk to your care team if you or your partner wish to become pregnant or think you might be pregnant. This medication can cause serious birth defects if taken during pregnancy and for 3 months after the last dose. A reliable form of contraception is recommended while taking this medication and for 3 months after the last dose. Talk to your care team about effective forms of contraception. Do not father a child while taking this medication and for 3 months after the last dose. Use a condom while having sex during this time period. Do not breastfeed while taking this medication. This medication may cause infertility. Talk to your care team if you are concerned about your fertility. What side effects may I notice from receiving this medication? Side effects that you should report to your care team as soon as possible: Allergic reactions--skin rash, itching, hives, swelling of the face, lips, tongue, or throat Heart attack--pain or tightness in the chest, shoulders, arms, or jaw, nausea, shortness of breath, cold or clammy skin, feeling faint or lightheaded Heart failure--shortness of breath, swelling of the ankles, feet, or hands, sudden weight gain, unusual weakness or fatigue Heart rhythm  changes--fast or irregular heartbeat, dizziness, feeling faint or lightheaded, chest pain, trouble breathing High ammonia level--unusual weakness or fatigue, confusion, loss of appetite, nausea, vomiting, seizures Infection--fever, chills, cough, sore throat, wounds that don't heal, pain or trouble when passing urine, general feeling of discomfort or being unwell Low red blood cell level--unusual weakness or fatigue, dizziness, headache, trouble breathing Pain, tingling, or numbness in the hands or feet, muscle weakness, change in vision, confusion or trouble speaking, loss of balance or coordination, trouble walking, seizures Redness, swelling, and blistering  of the skin over hands and feet Severe or prolonged diarrhea Unusual bruising or bleeding Side effects that usually do not require medical attention (report to your care team if they continue or are bothersome): Dry skin Headache Increased tears Nausea Pain, redness, or swelling with sores inside the mouth or throat Sensitivity to light Vomiting This list may not describe all possible side effects. Call your doctor for medical advice about side effects. You may report side effects to FDA at 1-800-FDA-1088. Where should I keep my medication? This medication is given in a hospital or clinic. It will not be stored at home. NOTE: This sheet is a summary. It may not cover all possible information. If you have questions about this medicine, talk to your doctor, pharmacist, or health care provider.  2024 Elsevier/Gold Standard (2021-10-02 00:00:00)  Hypomagnesemia Hypomagnesemia is a condition in which the level of magnesium in the blood is too low. Magnesium is a mineral that is found in many foods. It is used in many different processes in the body. Hypomagnesemia can affect every organ in the body. In severe cases, it can cause life-threatening problems. What are the causes? This condition may be caused by: Not getting enough magnesium  in your diet or not having enough healthy foods to eat (malnutrition). Problems with magnesium absorption in the intestines. Dehydration. Excessive use of alcohol. Vomiting. Severe or long-term (chronic) diarrhea. Some medicines, including medicines that make you urinate more often (diuretics). Certain diseases, such as kidney disease, diabetes, celiac disease, and overactive thyroid. What are the signs or symptoms? Symptoms of this condition include: Loss of appetite, nausea, and vomiting. Involuntary shaking or trembling of a body part (tremor). Muscle weakness or tingling in the arms and legs. Sudden tightening of muscles (muscle spasms). Confusion. Psychiatric issues, such as: Depression and irritability. Psychosis. A feeling of fluttering of the heart (palpitations). Seizures. These symptoms are more severe if magnesium levels drop suddenly. How is this diagnosed? This condition may be diagnosed based on: Your symptoms and medical history. A physical exam. Blood and urine tests. How is this treated? Treatment depends on the cause and the severity of the condition. It may be treated by: Taking a magnesium supplement. This can be taken in pill form. If the condition is severe, magnesium is usually given through an IV. Making changes to your diet. You may be directed to eat foods that have a lot of magnesium, such as green leafy vegetables, peas, beans, and nuts. Not drinking alcohol. If you are struggling not to drink, ask your health care provider for help. Follow these instructions at home: Eating and drinking     Make sure that your diet includes foods with magnesium. Foods that have a lot of magnesium in them include: Green leafy vegetables, such as spinach and broccoli. Beans and peas. Nuts and seeds, such as almonds and sunflower seeds. Whole grains, such as whole grain bread and fortified cereals. Drink fluids that contain salts and minerals (electrolytes), such as  sports drinks, when you are active. Do not drink alcohol. General instructions Take over-the-counter and prescription medicines only as told by your health care provider. Take magnesium supplements as directed if your health care provider tells you to take them. Have your magnesium levels monitored as told by your health care provider. Keep all follow-up visits. This is important. Contact a health care provider if: You get worse instead of better. Your symptoms return. Get help right away if: You develop severe muscle weakness. You have trouble  breathing. You feel that your heart is racing. These symptoms may represent a serious problem that is an emergency. Do not wait to see if the symptoms will go away. Get medical help right away. Call your local emergency services (911 in the [place] Do not drive yourself to the hospital. Summary Hypomagnesemia is a condition in which the level of magnesium in the blood is too low. Hypomagnesemia can affect every organ in the body. Treatment may include eating more foods that contain magnesium, taking magnesium supplements, and not drinking alcohol. Have your magnesium levels monitored as told by your health care provider. This information is not intended to replace advice given to you by your health care provider. Make sure you discuss any questions you have with your health care provider. Document Revised: 10/24/2020 Document Reviewed: 10/24/2020 Elsevier Patient Education  2024 Else[employer] Inc.  Hypokalemia Hypokalemia means that the amount of potassium in the blood is lower than normal. Potassium is a mineral (electrolyte) that helps regulate the amount of fluid in the body. It also stimulates muscle tightening (contraction) and helps nerves work properly. Normally, most of the body's potassium is inside cells, and only a very small amount is in the blood. Because the amount in the blood is so small, minor changes to potassium levels in the blood can  be life-threatening. What are the causes? This condition may be caused by: Antibiotic medicine. Diarrhea or vomiting. Taking too much of a medicine that helps you have a bowel movement (laxative) can cause diarrhea and lead to hypokalemia. Chronic kidney disease (CKD). Medicines that help the body get rid of excess fluid (diuretics). Eating disorders, such as anorexia or bulimia. Low magnesium levels in the body. Sweating a lot. What are the signs or symptoms? Symptoms of this condition include: Weakness. Constipation. Fatigue. Muscle cramps. Mental confusion. Skipped heartbeats or irregular heartbeat (palpitations). Tingling or numbness. How is this diagnosed? This condition is diagnosed with a blood test. How is this treated? This condition may be treated by: Taking potassium supplements. Adjusting the medicines that you take. Eating more foods that contain a lot of potassium. If your potassium level is very low, you may need to get potassium through an IV and be monitored in the hospital. Follow these instructions at home: Eating and drinking  Eat a healthy diet. A healthy diet includes fresh fruits and vegetables, whole grains, healthy fats, and lean proteins. If told, eat more foods that contain a lot of potassium. These include: Nuts, such as peanuts and pistachios. Seeds, such as sunflower seeds and pumpkin seeds. Peas, lentils, and lima beans. Whole grain and bran cereals and breads. Fresh fruits and vegetables, such as apricots, avocado, bananas, cantaloupe, kiwi, oranges, tomatoes, asparagus, and potatoes. Juices, such as orange, tomato, and prune. Lean meats, including fish. Milk and milk products, such as yogurt. General instructions Take over-the-counter and prescription medicines only as told by your health care provider. This includes vitamins, natural food products, and supplements. Keep all follow-up visits. This is important. Contact a health care provider  if: You have weakness that gets worse. You feel your heart pounding or racing. You vomit. You have diarrhea. You have diabetes and you have trouble keeping your blood sugar in your target range. Get help right away if: You have chest pain. You have shortness of breath. You have vomiting or diarrhea that lasts for more than 2 days. You faint. These symptoms may be an emergency. Get help right away. Call 911. Do not wait to see  if the symptoms will go away. Do not drive yourself to the hospital. Summary Hypokalemia means that the amount of potassium in the blood is lower than normal. This condition is diagnosed with a blood test. Hypokalemia may be treated by taking potassium supplements, adjusting the medicines that you take, or eating more foods that are high in potassium. If your potassium level is very low, you may need to get potassium through an IV and be monitored in the hospital. This information is not intended to replace advice given to you by your health care provider. Make sure you discuss any questions you have with your health care provider. Document Revised: 02/08/2021 Document Reviewed: 02/08/2021 Elsevier Patient Education  2024 [employer].

## 2023-07-03 ENCOUNTER — Other Ambulatory Visit: Payer: Self-pay | Admitting: Pharmacist

## 2023-07-03 LAB — CANCER ANTIGEN 19-9: CA 19-9: 489 U/mL — ABNORMAL HIGH (ref 0–35)

## 2023-07-03 MED ORDER — VALSARTAN 160 MG PO TABS: 160.0000 mg | ORAL_TABLET | Freq: Every day | ORAL | 3 refills | Status: DC

## 2023-07-03 NOTE — Patient Instructions (Signed)
Alyssa Ross,   It was great talking to you today!  Please feel free to reach out if you have any medication-related issues between now and our next call.   I have updated the valsartan prescription to reflect how you are currently taking it at once daily.   Take care,   Catie Eppie Gibson, PharmD, BCACP, CPP Clinical Pharmacist [facility] [phone]

## 2023-07-03 NOTE — Progress Notes (Signed)
07/03/2023 Name: Alyssa Ross MRN: [id number] DOB: 12-30-1965  No chief complaint on file.   Alyssa Ross is a 58 y.o. year old female who presented for a telephone visit.   They were referred to the pharmacist by their PCP for assistance in managing diabetes.    Subjective:  Care Team: Primary Care Provider: Dorothyann Peng, [place] ; Next Scheduled Visit: 07/09/23  Medication Access/Adherence  Current Pharmacy:  [id number] [place], [place] - [address] AT [facility] [address] [place] [address] Phone: [phone] Fax: [phone]  [facility] - [place], [place] - [address] [place] [address] Phone: [phone] Fax: [phone]  Alyssa [place] - [facility] [address] [place] [address] Phone: [phone] Fax: [phone]  [facility] - [place], [place] - [address] [facility] [address] [place] [address] Phone: [phone] Fax: [phone]  [id number] - [place], [place] - [address] AT [facility] OF [address] [place] [address] Phone: [phone] Fax: [phone]   Patient reports affordability concerns with their medications: No  Patient reports access/transportation concerns to their pharmacy: No  Patient reports adherence concerns with their medications:  No    Patient reports overall feeling burdened by managing her health, given diabetes, insulin injection burden, and ongoing treatment/management of pancreatic cancer.   Fiasp  Diabetes:  Current medications: Tresiba 17 units daily generally, 25 units around the time of steroids; Humalog per sliding scale - reports she was giving a 1:7 CHO ratio, generally giving ~4-10 with meals, up to 20 units with meals around the time of steroids.   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 Variability in appetite, GI upset nausea/vomiting impacting choice of medications as well.   Date of Download: 06/20/23-07/03/23 % Time CGM is active: 95% Average Glucose: 136 mg/dL Glucose Management Indicator: 9.2  Glucose Variability: 43.3 (goal <36%) Time in Goal:  - Time in range 70-180: 33% - Time above range: 66% - Time below range: 1%  Variable appetite patterns relating to infusion schedule, steroid schedule. Notes a very busy work schedule these past few weeks - sometimes eats what is provided at work and isn'Ross able to give herself Humalog the full ~15 minutes before.   Current meal patterns:  - Breakfast: around 10 am; tenderloin, scrambled egg on bun; bagel w/ [place] sausage and egg - Lunch: sometimes skips; but if she does it's around 3-4 pm; no typical patterns;  - Supper: 9 pm; vegetables usually  - Snacks: hardly ever snacks  - Drinks: trying to stay away from sweet drinks, has minimized overall   Current medication access support: no affordability issues   Not interested in discussing insulin pump.   Hypertension:  Current medications: valsartan 160 mg twice daily - actually has been taking once daily; spironolactone 25 mg PRN edema   Hyperlipidemia/ASCVD Risk Reduction  Current lipid lowering medications: rosuvastatin 10 mg daily   Antiplatelet regimen: none due to Eliquis (hx DVT)   Objective:  Lab Results  Component Value Date   HGBA1C 8.5 (H) 07/02/2023    Lab Results  Component Value Date   CREATININE 0.92 07/02/2023   BUN 6 07/02/2023   NA 141 07/02/2023   K 2.9 (Ross) 07/02/2023   CL 110 07/02/2023   CO2 26 07/02/2023    Lab Results  Component Value Date   CHOL 120 12/17/2022   HDL 44 12/17/2022   LDLCALC 62 12/17/2022   LDLDIRECT 97.7 02/11/2014   TRIG 68 12/17/2022   CHOLHDL 2.7 12/17/2022    Medications Reviewed Today     Reviewed by Alyssa Ross (Pharmacist) on 07/03/23 at 1409  Med List  Status: <None>   Medication Order Taking? Sig Documenting Provider Last Dose Status Informant  apixaban (ELIQUIS) 5 MG TABS tablet [id number] Yes Take 1 tablet (5 mg total) by mouth 2 (two) times daily. Alyssa Ross Taking Active   cetirizine (ZYRTEC) 10 MG tablet [id number] Yes Take 10 mg by mouth daily. Provider, Historical, [place] Taking Active   Continuous Blood Gluc Sensor (FREESTYLE LIBRE 2 SENSOR) MISC [id number] Yes APPLY EVERY 14 DAYS Alyssa Ross Taking Active Self  glucose blood test strip [id number] Yes Check sugar 2 times daily. Alyssa Ross Taking Active Self  Insulin Degludec [facility]) [id number] Yes Inject into the skin. 12-18 Units Provider, Historical, [place] Taking Active            Med Note Clearance Coots, Alyssa Ross   Thu Jul 03, 2023  2:06 PM) 17 on non steroid days  insulin lispro (HUMALOG KWIKPEN) 200 UNIT/ML KwikPen [id number]  6 units with breakfast, 13 units with lunch, 16 units with dinner Provider, Historical, [place]  Active            Med Note Alyssa Ross   Thu Aug 15, 2022  9:03 AM) Dose varies  Insulin Pen Needle (BD PEN NEEDLE NANO U/F) 32G X 4 MM MISC [id number]  USE TO INJECT INSULIN THREE TIMES A Erlene Quan, [place]  Active   lidocaine-prilocaine (EMLA) cream [id number]  Apply 1 Application topically as directed. Apply 1 hour prior to stick and cover with plastic wrap Alyssa Ross  Active   magic mouthwash (nystatin, diphenhydrAMINE, alum & mag hydroxide) suspension mixture [id number]  Take 5 mLs by mouth 4 (four) times daily as needed for mouth pain.   Active   magnesium oxide (MAG-OX) 400 (240 Mg) MG tablet [id number] Yes TAKE 1 TABLET(400 MG) BY MOUTH TWICE DAILY Ladene Artist, [place] Taking Active   ondansetron (ZOFRAN) 8 MG tablet [id number]  Take 1 tablet (8 mg total) by mouth every 8 (eight) hours as needed for nausea or vomiting (do not begin until 72 hours after chemo).  Patient not taking: Reported on 06/05/2023   Alyssa Ross  Active    potassium chloride SA (KLOR-CON M) 20 MEQ tablet [id number] Yes Take 1 tablet (20 mEq total) by mouth 3 (three) times daily. Alyssa Ross Taking Active   prochlorperazine (COMPAZINE) 10 MG tablet [id number] No TAKE 1 TABLET(10 MG) BY MOUTH EVERY 6 HOURS AS NEEDED FOR NAUSEA OR VOMITING  Patient not taking: Reported on 07/03/2023   Alyssa Ross Not Taking Active   rosuvastatin (CRESTOR) 10 MG tablet [id number] Yes TAKE 1 TABLET AT BEDTIME Alyssa Peng, [place] Taking Active   spironolactone (ALDACTONE) 25 MG tablet [id number] Yes TAKE 1 TABLET(25 MG) BY MOUTH DAILY Alyssa Peng, [place] Taking Active            Med Note [place], Javonna Balli [place]   Thu Jul 03, 2023  2:08 PM) Takes when need based on fluid  tretinoin (RETIN-A) 0.05 % cream [id number] No Apply topically at bedtime.  Patient not taking: Reported on 07/03/2023   Provider, Historical, [place] Not Taking Active  valsartan (DIOVAN) 160 MG tablet [id number] Yes TAKE 1 TABLET BY MOUTH TWICE DAILY Alyssa Peng, [place] Taking Active            Med Note [facility], [place]   Thu Jul 03, 2023  2:07 PM)                Assessment/Plan:   Diabetes: - Currently uncontrolled but improved per change in A1c. Most recent GMI is more elevated, but patient notes the past two weeks have been more abnormal for her in terms of meals, stress.  - Discussed Endo noted goals of <~7-7.5%. Discussed setting goal right now of A1c <8%.  - Reviewed dietary modifications including: dietary recommendations impacted by changing appetite around chemo regimen, acknowledged this with patient. Discussed incorporating protein/fibers into meals to aid with glycemic stability.  - Discussed options for ultra rapid meal time insulin, such as Fiasp, to more quickly work when she is unable to dose insulin 15 minutes before a meal. Offered to coordinate with Endo to consider; patient elects to continue current regimen at this time.  - Recommend to continue current regimen.  Continue plan to follow up with Endo in April. We will follow up between now and then.    Hypertension: - Currently controlled. Will adjust valsartan dose to reflect current administration and prevent issues with medication adherence metric. Continue valsartan 160 mg daily.   Hyperlipidemia/ASCVD Risk Reduction: - Currently controlled.  - Recommend to continue rosuvastatin 10 mg daily    Follow Up Plan: pharmacist call in ~ 6 weeks  Catie Eppie Gibson, PharmD, BCACP, CPP Clinical Pharmacist [facility] [phone]

## 2023-07-04 ENCOUNTER — Inpatient Hospital Stay: Payer: BC Managed Care – PPO

## 2023-07-04 VITALS — BP 94/58 | HR 68 | Temp 97.7°F | Resp 18

## 2023-07-04 DIAGNOSIS — C7802 Secondary malignant neoplasm of left lung: Secondary | ICD-10-CM | POA: Diagnosis not present

## 2023-07-04 DIAGNOSIS — C7801 Secondary malignant neoplasm of right lung: Secondary | ICD-10-CM | POA: Diagnosis not present

## 2023-07-04 DIAGNOSIS — C250 Malignant neoplasm of head of pancreas: Secondary | ICD-10-CM

## 2023-07-04 DIAGNOSIS — Z5111 Encounter for antineoplastic chemotherapy: Secondary | ICD-10-CM | POA: Diagnosis not present

## 2023-07-04 MED ORDER — SODIUM CHLORIDE 0.9% FLUSH
10.0000 mL | INTRAVENOUS | Status: DC | PRN
  Administered 2023-07-04: 10 mL

## 2023-07-04 MED ORDER — HEPARIN SOD (PORK) LOCK FLUSH 100 UNIT/ML IV SOLN
500.0000 [IU] | Freq: Once | INTRAVENOUS | Status: AC | PRN
  Administered 2023-07-04: 500 [IU]

## 2023-07-04 NOTE — Patient Instructions (Signed)
Implanted [facility] Guide An implanted port is a device that is placed under the skin. It is usually placed in the chest. The device may vary based on the need. Implanted ports can be used to give IV medicine, to take blood, or to give fluids. You may have an implanted port if: You need IV medicine that would be irritating to the small veins in your hands or arms. You need IV medicines, such as chemotherapy, for a long period of time. You need IV nutrition for a long period of time. You may have fewer limitations when using a port than you would if you used other types of long-term IVs. You will also likely be able to return to normal activities after your incision heals. An implanted port has two main parts: Reservoir. The reservoir is the part where a needle is inserted to give medicines or draw blood. The reservoir is round. After the port is placed, it appears as a small, raised area under your skin. Catheter. The catheter is a small, thin tube that connects the reservoir to a vein. Medicine that is inserted into the reservoir goes into the catheter and then into the vein. How is my port accessed? To access your port: A numbing cream may be placed on the skin over the port site. Your health care provider will put on a mask and sterile gloves. The skin over your port will be cleaned carefully with a germ-killing soap and allowed to dry. Your health care provider will gently pinch the port and insert a needle into it. Your health care provider will check for a blood return to make sure the port is in the vein and is still working (patent). If your port needs to remain accessed to get medicine continuously (constant infusion), your health care provider will place a clear bandage (dressing) over the needle site. The dressing and needle will need to be changed every week, or as told by your health care provider. What is flushing? Flushing helps keep the port working. Follow instructions from your  health care provider about how and when to flush the port. Ports are usually flushed with saline solution or a medicine called heparin. The need for flushing will depend on how the port is used: If the port is only used from time to time to give medicines or draw blood, the port may need to be flushed: Before and after medicines have been given. Before and after blood has been drawn. As part of routine maintenance. Flushing may be recommended every 4-6 weeks. If a constant infusion is running, the port may not need to be flushed. Throw away any syringes in a disposal container that is meant for sharp items (sharps container). You can buy a sharps container from a pharmacy, or you can make one by using an empty hard plastic bottle with a cover. How long will my port stay implanted? The port can stay in for as long as your health care provider thinks it is needed. When it is time for the port to come out, a surgery will be done to remove it. The surgery will be similar to the procedure that was done to put the port in. Follow these instructions at home: Caring for your port and port site Flush your port as told by your health care provider. If you need an infusion over several days, follow instructions from your health care provider about how to take care of your port site. Make sure you: Change your  dressing as told by your health care provider. Wash your hands with soap and water for at least 20 seconds before and after you change your dressing. If soap and water are not available, use alcohol-based hand sanitizer. Place any used dressings or infusion bags into a plastic bag. Throw that bag in the trash. Keep the dressing that covers the needle clean and dry. Do not get it wet. Do not use scissors or sharp objects near the infusion tubing. Keep any external tubes clamped, unless they are being used. Check your port site every day for signs of infection. Check for: Redness, swelling, or  pain. Fluid or blood. Warmth. Pus or a bad smell. Protect the skin around the port site. Avoid wearing bra straps that rub or irritate the site. Protect the skin around your port from seat belts. Place a soft pad over your chest if needed. Bathe or shower as told by your health care provider. The site may get wet as long as you are not actively receiving an infusion. General instructions  Return to your normal activities as told by your health care provider. Ask your health care provider what activities are safe for you. Carry a medical alert card or wear a medical alert bracelet at all times. This will let health care providers know that you have an implanted port in case of an emergency. Where to find more information American Cancer Society: www.cancer.org American Society of Clinical Oncology: www.cancer.net Contact a health care provider if: You have a fever or chills. You have redness, swelling, or pain at the port site. You have fluid or blood coming from your port site. Your incision feels warm to the touch. You have pus or a bad smell coming from the port site. Summary Implanted ports are usually placed in the chest for long-term IV access. Follow instructions from your health care provider about flushing the port and changing bandages (dressings). Take care of the area around your port by avoiding clothing that puts pressure on the area, and by watching for signs of infection. Protect the skin around your port from seat belts. Place a soft pad over your chest if needed. Contact a health care provider if you have a fever or you have redness, swelling, pain, fluid, or a bad smell at the port site. This information is not intended to replace advice given to you by your health care provider. Make sure you discuss any questions you have with your health care provider. Document Revised: 11/28/2020 Document Reviewed: 11/28/2020 Elsevier Patient Education  2024 [employer].

## 2023-07-09 ENCOUNTER — Ambulatory Visit: Payer: BC Managed Care – PPO | Admitting: Internal Medicine

## 2023-07-09 ENCOUNTER — Encounter: Payer: Self-pay | Admitting: Internal Medicine

## 2023-07-09 VITALS — BP 124/72 | HR 85 | Temp 98.4°F | Ht 68.0 in | Wt 178.6 lb

## 2023-07-09 DIAGNOSIS — E1165 Type 2 diabetes mellitus with hyperglycemia: Secondary | ICD-10-CM

## 2023-07-09 DIAGNOSIS — G620 Drug-induced polyneuropathy: Secondary | ICD-10-CM | POA: Diagnosis not present

## 2023-07-09 DIAGNOSIS — I10 Essential (primary) hypertension: Secondary | ICD-10-CM

## 2023-07-09 DIAGNOSIS — T451X5A Adverse effect of antineoplastic and immunosuppressive drugs, initial encounter: Secondary | ICD-10-CM

## 2023-07-09 DIAGNOSIS — H6122 Impacted cerumen, left ear: Secondary | ICD-10-CM | POA: Diagnosis not present

## 2023-07-09 DIAGNOSIS — C7A8 Other malignant neuroendocrine tumors: Secondary | ICD-10-CM

## 2023-07-09 DIAGNOSIS — Z794 Long term (current) use of insulin: Secondary | ICD-10-CM

## 2023-07-09 NOTE — Progress Notes (Signed)
I,Victoria T Deloria Lair, CMA,acting as a Neurosurgeon for Gwynneth Aliment, MD.,have documented all relevant documentation on the behalf of Gwynneth Aliment, MD,as directed by  Gwynneth Aliment, MD while in the presence of Gwynneth Aliment, MD.  Subjective:  Patient ID: Alyssa Ross , female    DOB: Jan 14, 1966 , 58 y.o.   MRN: [id number]  Chief Complaint  Patient presents with   Hypertension   Diabetes    HPI  She is here today for BP/DM check. She is followed by [facility] for diabetes med management. She reports compliance with meds. She reports having mammogram scheduled next week. She would like her ears checked today.      Hypertension This is a chronic problem. The current episode started more than 1 year ago. The problem has been gradually improving since onset. The problem is uncontrolled. Pertinent negatives include no blurred vision. Past treatments include ACE inhibitors and diuretics. The current treatment provides moderate improvement.  Diabetes She presents for her follow-up diabetic visit. She has type 2 diabetes mellitus. There are no hypoglycemic associated symptoms. Pertinent negatives for diabetes include no blurred vision, no polydipsia, no polyphagia and no polyuria. There are no hypoglycemic complications. Risk factors for coronary artery disease include diabetes mellitus, hypertension and dyslipidemia. Current diabetic treatment includes insulin injections. She is compliant with treatment most of the time. She is following a diabetic diet. She participates in exercise intermittently. Eye exam is current.     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   Yeast vaginitis 2006     Family History  Problem Relation Age of Onset   Diabetes Mother    Dementia Mother    Hyperlipidemia Mother    Hypertension Mother    Irregular heart beat Mother    Diabetes Father    Hypertension Father    Hyperlipidemia Father    Down syndrome Sister    Diabetes Sister    Hyperlipidemia Brother    Heart Problems Brother    Goiter Maternal Aunt    Thyroid nodules Sister    Cancer Cousin 60       eye; maternal first cousin   Goiter Cousin    Cancer Paternal Aunt        unknown form of cancer   Cancer Cousin        unknown form of cancer; paternal first cousin   Pancreatic cancer Cousin 66       paternal first cousin   Cancer Cousin 83       unknown cancer; paternal first cousin   Cancer Cousin 47       unknown cancer; paternal first cousin     Current Outpatient Medications:    apixaban (ELIQUIS) 5 MG TABS tablet, Take 1 tablet (5 mg total) by mouth 2 (two) times daily., Disp: 180  tablet, Rfl: 0   cetirizine (ZYRTEC) 10 MG tablet, Take 10 mg by mouth daily., Disp: , Rfl:    Continuous Blood Gluc Sensor (FREESTYLE LIBRE 2 SENSOR) MISC, APPLY EVERY 14 DAYS, Disp: 2 each, Rfl: 5   glucose blood test strip, Check sugar 2 times daily., Disp: 100 each, Rfl: 12   Insulin Degludec (TRESIBA FLEXTOUCH [place]), Inject into the skin. 12-18 Units, Disp: , Rfl:    insulin lispro (HUMALOG KWIKPEN) 200 UNIT/ML KwikPen, 6 units with breakfast, 13 units with lunch, 16 units with dinner, Disp: , Rfl:    Insulin Pen Needle (BD PEN NEEDLE NANO U/F) 32G X 4 MM MISC, USE TO INJECT INSULIN THREE TIMES A DAY, Disp: 270 each, Rfl: 3   lidocaine-prilocaine (EMLA) cream, Apply 1 Application topically as  directed. Apply 1 hour prior to stick and cover with plastic wrap, Disp: 30 g, Rfl: 5   magic mouthwash (nystatin, diphenhydrAMINE, alum & mag hydroxide) suspension mixture, Take 5 mLs by mouth 4 (four) times daily as needed for mouth pain., Disp: 240 mL, Rfl: 1   magnesium oxide (MAG-OX) 400 (240 Mg) MG tablet, TAKE 1 TABLET(400 MG) BY MOUTH TWICE DAILY, Disp: 180 tablet, Rfl: 1   potassium chloride SA (KLOR-CON M) 20 MEQ tablet, Take 1 tablet (20 mEq total) by mouth 3 (three) times daily., Disp: 90 tablet, Rfl: 1   rosuvastatin (CRESTOR) 10 MG tablet, TAKE 1 TABLET AT BEDTIME, Disp: 90 tablet, Rfl: 3   spironolactone (ALDACTONE) 25 MG tablet, TAKE 1 TABLET(25 MG) BY MOUTH DAILY, Disp: 90 tablet, Rfl: 2   valsartan (DIOVAN) 160 MG tablet, Take 1 tablet (160 mg total) by mouth daily., Disp: 90 tablet, Rfl: 3   ondansetron (ZOFRAN) 8 MG tablet, Take 1 tablet (8 mg total) by mouth every 8 (eight) hours as needed for nausea or vomiting (do not begin until 72 hours after chemo). (Patient not taking: Reported on 07/09/2023), Disp: 20 tablet, Rfl: 3   prochlorperazine (COMPAZINE) 10 MG tablet, TAKE 1 TABLET(10 MG) BY MOUTH EVERY 6 HOURS AS NEEDED FOR NAUSEA OR VOMITING (Patient not taking: Reported on 06/05/2023), Disp: 60 tablet, Rfl: 2 No current facility-administered medications for this visit.  Facility-Administered Medications Ordered in Other Visits:    sodium chloride flush (NS) 0.9 % injection 10 mL, 10 mL, Intravenous, PRN, Rana Snare, NP, 10 mL at 10/24/22 1311   Allergies  Allergen Reactions   Cherry Rash, Itching and Other (See Comments)    Other reaction(s): Unknown Other reaction(s): Unknown   Lemon Oil Rash     Review of Systems  Constitutional: Negative.   Eyes:  Negative for blurred vision.  Respiratory: Negative.    Cardiovascular: Negative.   Endocrine: Negative for polydipsia, polyphagia and polyuria.  Neurological: Negative.   Psychiatric/Behavioral: Negative.        Today's Vitals   07/09/23 1433  BP: 124/72  Pulse: 85  Temp: 98.4 F (36.9 C)  SpO2: 98%  Weight: 178 lb 9.6 oz (81 kg)  Height: 5\' 8"  (1.727 m)   Body mass index is 27.16 kg/m.  Wt Readings from Last 3 Encounters:  07/09/23 178 lb 9.6 oz (81 kg)  07/02/23 177 lb 14.4 oz (80.7 kg)  06/24/23 172 lb 6.4 oz (78.2 kg)    BP Readings from Last 3 Encounters:  07/09/23 124/72  07/04/23 (!) 94/58  07/02/23 115/81     Objective:  Physical Exam Vitals and nursing note reviewed.  Constitutional:      Appearance:  Normal appearance.  HENT:     Head: Normocephalic and atraumatic.     Right Ear: Tympanic membrane, ear canal and external ear normal.     Left Ear: Ear canal and external ear normal. There is impacted cerumen.  Eyes:     Extraocular Movements: Extraocular movements intact.  Cardiovascular:     Rate and Rhythm: Normal rate and regular rhythm.     Heart sounds: Normal heart sounds.  Pulmonary:     Effort: Pulmonary effort is normal.     Breath sounds: Normal breath sounds.  Musculoskeletal:     Cervical back: Normal range of motion.  Skin:    General: Skin is warm.  Neurological:     General: No focal deficit present.     Mental Status: She is alert.  Psychiatric:        Mood and Affect: Mood normal.        Behavior: Behavior normal.         Assessment And Plan:  Essential hypertension Assessment & Plan: Chronic, well controlled. She will c/o spironolactone 25mg  prn.  Due to recent low readings, she has not been taking valsartan160mg  daily. She is advised to take 1/2 tablet 160mg  if her blood pressure goes up.  Encouraged to follow low sodium diet. She will f/u in six months.    Uncontrolled type 2 diabetes mellitus with hyperglycemia, with long-term current use of insulin [facility]) Assessment & Plan: Chronic, she is now followed by Endo at [facility]. She will continue with Evaristo Bury 12-18 units nightly and Humalog tid.  Most recent notes reviewed in  full detail.  She will f/u in six months.    Impacted cerumen of left ear Assessment & Plan: After obtaining verbal consent, left ear was flushed by irrigation. No TM abnormalities were noted. She tolerated procedure well without any complications.     Orders: -     Ear Lavage  Chemotherapy-induced neuropathy (HCC) Assessment & Plan: Chronic, sx have improved since she is no longer on Oxaliplatin.   Primary malignant neuroendocrine neoplasm of pancreas [facility]) Assessment & Plan: Oncology input is appreciated. Treatment plan as per Oncology: PANCREAS Liposomal Irinotecan + Leucovorin + 5-FU IVCI q14d.        Return if symptoms worsen or fail to improve.  Patient was given opportunity to ask questions. Patient verbalized understanding of the plan and was able to repeat key elements of the plan. All questions were answered to their satisfaction.    I, Gwynneth Aliment, MD, have reviewed all documentation for this visit. The documentation on 07/09/23 for the exam, diagnosis, procedures, and orders are all accurate and complete.   IF YOU HAVE BEEN REFERRED TO A SPECIALIST, IT MAY TAKE 1-2 WEEKS TO SCHEDULE/PROCESS THE REFERRAL. IF YOU HAVE NOT HEARD FROM US/SPECIALIST IN TWO WEEKS, PLEASE GIVE [place] A CALL AT [phone].   THE PATIENT IS ENCOURAGED TO PRACTICE SOCIAL DISTANCING DUE TO THE COVID-19 PANDEMIC.

## 2023-07-09 NOTE — Patient Instructions (Signed)
Hypertension, Adult Hypertension is another name for high blood pressure. High blood pressure forces your heart to work harder to pump blood. This can cause problems over time. There are two numbers in a blood pressure reading. There is a top number (systolic) over a bottom number (diastolic). It is best to have a blood pressure that is below 120/80. What are the causes? The cause of this condition is not known. Some other conditions can lead to high blood pressure. What increases the risk? Some lifestyle factors can make you more likely to develop high blood pressure: Smoking. Not getting enough exercise or physical activity. Being overweight. Having too much fat, sugar, calories, or salt (sodium) in your diet. Drinking too much alcohol. Other risk factors include: Having any of these conditions: Heart disease. Diabetes. High cholesterol. Kidney disease. Obstructive sleep apnea. Having a family history of high blood pressure and high cholesterol. Age. The risk increases with age. Stress. What are the signs or symptoms? High blood pressure may not cause symptoms. Very high blood pressure (hypertensive crisis) may cause: Headache. Fast or uneven heartbeats (palpitations). Shortness of breath. Nosebleed. Vomiting or feeling like you may vomit (nauseous). Changes in how you see. Very bad chest pain. Feeling dizzy. Seizures. How is this treated? This condition is treated by making healthy lifestyle changes, such as: Eating healthy foods. Exercising more. Drinking less alcohol. Your doctor may prescribe medicine if lifestyle changes do not help enough and if: Your top number is above 130. Your bottom number is above 80. Your personal target blood pressure may vary. Follow these instructions at home: Eating and drinking  If told, follow the DASH eating plan. To follow this plan: Fill one half of your plate at each meal with fruits and vegetables. Fill one fourth of your plate  at each meal with whole grains. Whole grains include whole-wheat pasta, brown rice, and whole-grain bread. Eat or drink low-fat dairy products, such as skim milk or low-fat yogurt. Fill one fourth of your plate at each meal with low-fat (lean) proteins. Low-fat proteins include fish, chicken without skin, eggs, beans, and tofu. Avoid fatty meat, cured and processed meat, or chicken with skin. Avoid pre-made or processed food. Limit the amount of salt in your diet to less than 1,500 mg each day. Do not drink alcohol if: Your doctor tells you not to drink. You are pregnant, may be pregnant, or are planning to become pregnant. If you drink alcohol: Limit how much you have to: 0-1 drink a day for women. 0-2 drinks a day for men. Know how much alcohol is in your drink. In the [place] one drink equals one 12 oz bottle of beer (355 mL), one 5 oz glass of wine (148 mL), or one 1 oz glass of hard liquor (44 mL). Lifestyle  Work with your doctor to stay at a healthy weight or to lose weight. Ask your doctor what the best weight is for you. Get at least 30 minutes of exercise that causes your heart to beat faster (aerobic exercise) most days of the week. This may include walking, swimming, or biking. Get at least 30 minutes of exercise that strengthens your muscles (resistance exercise) at least 3 days a week. This may include lifting weights or doing Pilates. Do not smoke or use any products that contain nicotine or tobacco. If you need help quitting, ask your doctor. Check your blood pressure at home as told by your doctor. Keep all follow-up visits. Medicines Take over-the-counter and prescription medicines  only as told by your doctor. Follow directions carefully. Do not skip doses of blood pressure medicine. The medicine does not work as well if you skip doses. Skipping doses also puts you at risk for problems. Ask your doctor about side effects or reactions to medicines that you should watch  for. Contact a doctor if: You think you are having a reaction to the medicine you are taking. You have headaches that keep coming back. You feel dizzy. You have swelling in your ankles. You have trouble with your vision. Get help right away if: You get a very bad headache. You start to feel mixed up (confused). You feel weak or numb. You feel faint. You have very bad pain in your: Chest. Belly (abdomen). You vomit more than once. You have trouble breathing. These symptoms may be an emergency. Get help right away. Call 911. Do not wait to see if the symptoms will go away. Do not drive yourself to the hospital. Summary Hypertension is another name for high blood pressure. High blood pressure forces your heart to work harder to pump blood. For most people, a normal blood pressure is less than 120/80. Making healthy choices can help lower blood pressure. If your blood pressure does not get lower with healthy choices, you may need to take medicine. This information is not intended to replace advice given to you by your health care provider. Make sure you discuss any questions you have with your health care provider. Document Revised: 03/15/2021 Document Reviewed: 03/15/2021 Elsevier Patient Education  2024 [employer].

## 2023-07-12 ENCOUNTER — Other Ambulatory Visit: Payer: Self-pay | Admitting: Oncology

## 2023-07-12 DIAGNOSIS — C250 Malignant neoplasm of head of pancreas: Secondary | ICD-10-CM

## 2023-07-13 ENCOUNTER — Other Ambulatory Visit: Payer: Self-pay | Admitting: Oncology

## 2023-07-13 NOTE — Assessment & Plan Note (Signed)
Oncology input is appreciated. Treatment plan as per Oncology: PANCREAS Liposomal Irinotecan + Leucovorin + 5-FU IVCI q14d.

## 2023-07-13 NOTE — Assessment & Plan Note (Signed)
Chronic, she is now followed by Endo at [facility]. She will continue with Evaristo Bury 12-18 units nightly and Humalog tid.  Most recent notes reviewed in full detail.  She will f/u in six months.

## 2023-07-13 NOTE — Assessment & Plan Note (Signed)
Chronic, sx have improved since she is no longer on Oxaliplatin.

## 2023-07-13 NOTE — Assessment & Plan Note (Signed)
After obtaining verbal consent, left ear was flushed by irrigation. No TM abnormalities were noted. She tolerated procedure well without any complications.

## 2023-07-13 NOTE — Assessment & Plan Note (Signed)
Chronic, well controlled. She will c/o spironolactone 25mg  prn.  Due to recent low readings, she has not been taking valsartan160mg  daily. She is advised to take 1/2 tablet 160mg  if her blood pressure goes up.  Encouraged to follow low sodium diet. She will f/u in six months.

## 2023-07-16 ENCOUNTER — Inpatient Hospital Stay: Payer: BC Managed Care – PPO

## 2023-07-16 ENCOUNTER — Inpatient Hospital Stay: Payer: BC Managed Care – PPO | Admitting: Oncology

## 2023-07-16 ENCOUNTER — Inpatient Hospital Stay: Payer: BC Managed Care – PPO | Attending: Genetic Counselor

## 2023-07-16 VITALS — BP 131/77 | HR 84 | Temp 97.9°F | Resp 18 | Ht 68.0 in | Wt 173.0 lb

## 2023-07-16 DIAGNOSIS — Z5111 Encounter for antineoplastic chemotherapy: Secondary | ICD-10-CM | POA: Insufficient documentation

## 2023-07-16 DIAGNOSIS — Z79899 Other long term (current) drug therapy: Secondary | ICD-10-CM | POA: Insufficient documentation

## 2023-07-16 DIAGNOSIS — C7802 Secondary malignant neoplasm of left lung: Secondary | ICD-10-CM | POA: Insufficient documentation

## 2023-07-16 DIAGNOSIS — C250 Malignant neoplasm of head of pancreas: Secondary | ICD-10-CM | POA: Diagnosis not present

## 2023-07-16 DIAGNOSIS — C259 Malignant neoplasm of pancreas, unspecified: Secondary | ICD-10-CM | POA: Diagnosis not present

## 2023-07-16 DIAGNOSIS — C7801 Secondary malignant neoplasm of right lung: Secondary | ICD-10-CM | POA: Insufficient documentation

## 2023-07-16 DIAGNOSIS — Z452 Encounter for adjustment and management of vascular access device: Secondary | ICD-10-CM | POA: Diagnosis not present

## 2023-07-16 LAB — CMP (CANCER CENTER ONLY)
ALT: 16 U/L (ref 0–44)
AST: 15 U/L (ref 15–41)
Albumin: 3.3 g/dL — ABNORMAL LOW (ref 3.5–5.0)
Alkaline Phosphatase: 203 U/L — ABNORMAL HIGH (ref 38–126)
Anion gap: 4 — ABNORMAL LOW (ref 5–15)
BUN: 15 mg/dL (ref 6–20)
CO2: 27 mmol/L (ref 22–32)
Calcium: 8.8 mg/dL — ABNORMAL LOW (ref 8.9–10.3)
Chloride: 105 mmol/L (ref 98–111)
Creatinine: 1.11 mg/dL — ABNORMAL HIGH (ref 0.44–1.00)
GFR, Estimated: 58 mL/min — ABNORMAL LOW (ref >60)
Glucose, Bld: 125 mg/dL — ABNORMAL HIGH (ref 70–99)
Potassium: 3.7 mmol/L (ref 3.5–5.1)
Sodium: 136 mmol/L (ref 135–145)
Total Bilirubin: 0.6 mg/dL (ref 0.0–1.2)
Total Protein: 7.2 g/dL (ref 6.5–8.1)

## 2023-07-16 LAB — CBC WITH DIFFERENTIAL (CANCER CENTER ONLY)
Abs Immature Granulocytes: 0.01 10*3/uL (ref 0.00–0.07)
Basophils Absolute: 0.1 10*3/uL (ref 0.0–0.1)
Basophils Relative: 2 %
Eosinophils Absolute: 0.1 10*3/uL (ref 0.0–0.5)
Eosinophils Relative: 1 %
HCT: 30.3 % — ABNORMAL LOW (ref 36.0–46.0)
Hemoglobin: 9.5 g/dL — ABNORMAL LOW (ref 12.0–15.0)
Immature Granulocytes: 0 %
Lymphocytes Relative: 27 %
Lymphs Abs: 1.4 10*3/uL (ref 0.7–4.0)
MCH: 29.9 pg (ref 26.0–34.0)
MCHC: 31.4 g/dL (ref 30.0–36.0)
MCV: 95.3 fL (ref 80.0–100.0)
Monocytes Absolute: 0.4 10*3/uL (ref 0.1–1.0)
Monocytes Relative: 8 %
Neutro Abs: 3.1 10*3/uL (ref 1.7–7.7)
Neutrophils Relative %: 62 %
Platelet Count: 408 10*3/uL — ABNORMAL HIGH (ref 150–400)
RBC: 3.18 MIL/uL — ABNORMAL LOW (ref 3.87–5.11)
RDW: 18.7 % — ABNORMAL HIGH (ref 11.5–15.5)
WBC Count: 5 10*3/uL (ref 4.0–10.5)
nRBC: 0 % (ref 0.0–0.2)

## 2023-07-16 LAB — MAGNESIUM: Magnesium: 1.7 mg/dL (ref 1.7–2.4)

## 2023-07-16 MED ORDER — SODIUM CHLORIDE 0.9 % IV SOLN: INTRAVENOUS | Status: DC

## 2023-07-16 MED ORDER — SODIUM CHLORIDE 0.9 % IV SOLN
400.0000 mg/m2 | Freq: Once | INTRAVENOUS | Status: AC
  Administered 2023-07-16: 776 mg via INTRAVENOUS
  Filled 2023-07-16: qty 38.8

## 2023-07-16 MED ORDER — DEXAMETHASONE SODIUM PHOSPHATE 10 MG/ML IJ SOLN
10.0000 mg | Freq: Once | INTRAMUSCULAR | Status: AC
  Administered 2023-07-16: 10 mg via INTRAVENOUS
  Filled 2023-07-16: qty 1

## 2023-07-16 MED ORDER — ATROPINE SULFATE 1 MG/ML IV SOLN
0.5000 mg | Freq: Once | INTRAVENOUS | Status: AC | PRN
  Administered 2023-07-16: 0.5 mg via INTRAVENOUS
  Filled 2023-07-16: qty 1

## 2023-07-16 MED ORDER — PALONOSETRON HCL INJECTION 0.25 MG/5ML
0.2500 mg | Freq: Once | INTRAVENOUS | Status: AC
  Administered 2023-07-16: 0.25 mg via INTRAVENOUS
  Filled 2023-07-16: qty 5

## 2023-07-16 MED ORDER — IRINOTECAN HCL LIPOSOME CHEMO INJECTION 43 MG/10ML
55.0000 mg/m2 | INJECTION | Freq: Once | INTRAVENOUS | Status: AC
  Administered 2023-07-16: 107.5 mg via INTRAVENOUS
  Filled 2023-07-16: qty 25

## 2023-07-16 MED ORDER — SODIUM CHLORIDE 0.9 % IV SOLN
2400.0000 mg/m2 | INTRAVENOUS | Status: DC
  Administered 2023-07-16: 5000 mg via INTRAVENOUS
  Filled 2023-07-16: qty 100

## 2023-07-16 NOTE — Patient Instructions (Addendum)
 [facility] - A DEPT OF [place]   Discharge Instructions:The chemotherapy medication bag should finish at 46 hours, 96 hours, or 7 days. For example, if your pump is scheduled for 46 hours and it was put on at 4:00 p.m., it should finish at 2:00 p.m. the day it is scheduled to come off regardless of your appointment time.     Estimated time to finish at 12:30 pm Friday July 18, 2023.   If the display on your pump reads Low Volume and it is beeping, take the batteries out of the pump and come to the cancer center for it to be taken off.   If the pump alarms go off prior to the pump reading Low Volume then call [phone] and someone can assist you.  If the plunger comes out and the chemotherapy medication is leaking out, please use your home chemo spill kit to clean up the spill. Do NOT use paper towels or other household products.  If you have problems or questions regarding your pump, please call either [phone] (24 hours a day) or the cancer center Monday-Friday 8:00 a.m.- 4:30 p.m. at the clinic number and we will assist you. If you are unable to get assistance, then go to the nearest Emergency Department and ask the staff to contact the IV team for assistance.   Thank you for choosing [facility] to provide your oncology and hematology care.   If you have a lab appointment with the Cancer Center, please go directly to the Cancer Center and check in at the registration area.   Wear comfortable clothing and clothing appropriate for easy access to any Portacath or PICC line.   We strive to give you quality time with your provider. You may need to reschedule your appointment if you arrive late (15 or more minutes).  Arriving late affects you and other patients whose appointments are after yours.  Also, if you miss three or more appointments without notifying the office, you may be dismissed from the clinic at the provider's  discretion.      For prescription refill requests, have your pharmacy contact our office and allow 72 hours for refills to be completed.    Today you received the following chemotherapy and/or immunotherapy agents Irinotecan  Liposome/Leucovorin /Fluorouracil /Magnesium /Potassium      To help prevent nausea and vomiting after your treatment, we encourage you to take your nausea medication as directed.  BELOW ARE SYMPTOMS THAT SHOULD BE REPORTED IMMEDIATELY: *FEVER GREATER THAN 100.4 F (38 C) OR HIGHER *CHILLS OR SWEATING *NAUSEA AND VOMITING THAT IS NOT CONTROLLED WITH YOUR NAUSEA MEDICATION *UNUSUAL SHORTNESS OF BREATH *UNUSUAL BRUISING OR BLEEDING *URINARY PROBLEMS (pain or burning when urinating, or frequent urination) *BOWEL PROBLEMS (unusual diarrhea, constipation, pain near the anus) TENDERNESS IN MOUTH AND THROAT WITH OR WITHOUT PRESENCE OF ULCERS (sore throat, sores in mouth, or a toothache) UNUSUAL RASH, SWELLING OR PAIN  UNUSUAL VAGINAL DISCHARGE OR ITCHING   Items with * indicate a potential emergency and should be followed up as soon as possible or go to the Emergency Department if any problems should occur.  Please show the CHEMOTHERAPY ALERT CARD or IMMUNOTHERAPY ALERT CARD at check-in to the Emergency Department and triage nurse.  Should you have questions after your visit or need to cancel or reschedule your appointment, please contact [facility] - A DEPT OF [facility]  Dept: [phone]  and follow the prompts.  Office  hours are 8:00 a.m. to 4:30 p.m. Monday - Friday. Please note that voicemails left after 4:00 p.m. may not be returned until the following business day.  We are closed weekends and major holidays. You have access to a nurse at all times for urgent questions. Please call the main number to the clinic Dept: [phone] and follow the prompts.   For any non-urgent questions, you may also contact your provider using MyChart. We  now offer e-Visits for anyone 36 and older to request care online for non-urgent symptoms. For details visit [email or web address].   Also download the MyChart app! Go to the app store, search MyChart, open the app, select [place], and log in with your MyChart username and password.  Irinotecan  Liposome Injection What is this medication? IRINOTECAN  LIPOSOME (eye ri noe TEE kan LIP oh som) treats pancreatic cancer. It works by slowing down the growth of cancer cells. This medicine may be used for other purposes; ask your health care provider or pharmacist if you have questions. COMMON BRAND NAME(S): ONIVYDE  What should I tell my care team before I take this medication? They need to know if you have any of these conditions: Blockage in your bowels Dehydration Infection Low white blood cell levels Lung disease An unusual or allergic reaction to irinotecan  liposome, irinotecan , other medications, foods, dyes, or preservatives Pregnant or trying to get pregnant Breast-feeding How should I use this medication? This medication is injected into a vein. It is given by your care team in a hospital or clinic setting. Talk to your care team about the use of this medication in children. Special care may be needed. Overdosage: If you think you have taken too much of this medicine contact a poison control center or emergency room at once. NOTE: This medicine is only for you. Do not share this medicine with others. What if I miss a dose? Keep appointments for follow-up doses. It is important not to miss your dose. Call your care team if you are unable to keep an appointment. What may interact with this medication? Do not take this medication with any of the following: Itraconazole This medication may also interact with the following: Certain antivirals for HIV or AIDS Certain medications for seizures, such as carbamazepine, fosphenytoin, phenytoin,  phenobarbital Clarithromycin Gemfibrozil Nefazodone Rifabutin Rifampin Rifapentine St. John's Wort Voriconazole This list may not describe all possible interactions. Give your health care provider a list of all the medicines, herbs, non-prescription drugs, or dietary supplements you use. Also tell them if you smoke, drink alcohol, or use illegal drugs. Some items may interact with your medicine. What should I watch for while using this medication? This medication may make you feel generally unwell. This is not uncommon as chemotherapy can affect healthy cells as well as cancer cells. Report any side effects. Continue your course of treatment even though you feel ill unless your care team tells you to stop. You may need blood work while you are taking this medication. This medication can cause serious side effects and allergic reactions. To reduce your risk, your care team may give you other medications to take before receiving this one. Be sure to follow the directions from your care team. Check with your care team if you get an attack of diarrhea, nausea and vomiting, or if you sweat a lot. The loss of too much body fluid can make it dangerous for you to take this medication. This medication may cause infertility. Talk to your care team if you are  concerned about your fertility. Talk to your care team if you wish to become pregnant or if you think you might be pregnant. This medication can cause serious birth defects if taken during pregnancy or if you get pregnant within 7 months after stopping therapy. A negative pregnancy test is required before starting this medication. A reliable form of contraception is recommended while taking this medication and for 7 months after stopping it. Talk to your care team about reliable forms of contraception. Use a condom during sex and for 4 months after stopping therapy. Tell your care team right away if you think your partner might be pregnant. This  medication can cause serious birth defects. Do not breast-feed while taking this medication and for 1 month after stopping therapy. This medication may increase your risk of getting an infection. Call your care team for advice if you get a fever, chills, sore throat, or other symptoms of a cold or flu. Do not treat yourself. Try to avoid being around people who are sick. Avoid taking medications that contain aspirin, acetaminophen , ibuprofen, naproxen, or ketoprofen unless instructed by your care team. These medications may hide a fever. Be careful brushing or flossing your teeth or using a toothpick because you may get an infection or bleed more easily. If you have any dental work done, tell your dentist you are receiving this medication. What side effects may I notice from receiving this medication? Side effects that you should report to your care team as soon as possible: Allergic reactions or angioedema--skin rash, itching or hives, swelling of the face, eyes, lips, tongue, arms, or legs, trouble swallowing or breathing Dry cough, shortness of breath or trouble breathing Diarrhea Infection--fever, chills, cough, or sore throat Side effects that usually do not require medical attention (report to your care team if they continue or are bothersome): Fatigue Loss of appetite Nausea Pain, redness, or swelling with sores inside the mouth or throat Vomiting This list may not describe all possible side effects. Call your doctor for medical advice about side effects. You may report side effects to FDA at 1-800-FDA-1088. Where should I keep my medication? This medication is given in a hospital or clinic. It will not be stored at home. NOTE: This sheet is a summary. It may not cover all possible information. If you have questions about this medicine, talk to your doctor, pharmacist, or health care provider.  2024 Elsevier/Gold Standard (2021-07-26 00:00:00)  Leucovorin  Injection What is this  medication? LEUCOVORIN  (loo koe VOR in) prevents side effects from certain medications, such as methotrexate. It works by increasing folate levels. This helps protect healthy cells in your body. It may also be used to treat anemia caused by low levels of folate. It can also be used with fluorouracil , a type of chemotherapy, to treat colorectal cancer. It works by increasing the effects of fluorouracil  in the body. This medicine may be used for other purposes; ask your health care provider or pharmacist if you have questions. What should I tell my care team before I take this medication? They need to know if you have any of these conditions: Anemia from low levels of vitamin B12 in the blood An unusual or allergic reaction to leucovorin , folic acid, other medications, foods, dyes, or preservatives Pregnant or trying to get pregnant Breastfeeding How should I use this medication? This medication is injected into a vein or a muscle. It is given by your care team in a hospital or clinic setting. Talk to your care  team about the use of this medication in children. Special care may be needed. Overdosage: If you think you have taken too much of this medicine contact a poison control center or emergency room at once. NOTE: This medicine is only for you. Do not share this medicine with others. What if I miss a dose? Keep appointments for follow-up doses. It is important not to miss your dose. Call your care team if you are unable to keep an appointment. What may interact with this medication? Capecitabine  Fluorouracil  Phenobarbital Phenytoin Primidone Trimethoprim;sulfamethoxazole This list may not describe all possible interactions. Give your health care provider a list of all the medicines, herbs, non-prescription drugs, or dietary supplements you use. Also tell them if you smoke, drink alcohol, or use illegal drugs. Some items may interact with your medicine. What should I watch for while using  this medication? Your condition will be monitored carefully while you are receiving this medication. This medication may increase the side effects of 5-fluorouracil . Tell your care team if you have diarrhea or mouth sores that do not get better or that get worse. What side effects may I notice from receiving this medication? Side effects that you should report to your care team as soon as possible: Allergic reactions--skin rash, itching, hives, swelling of the face, lips, tongue, or throat This list may not describe all possible side effects. Call your doctor for medical advice about side effects. You may report side effects to FDA at 1-800-FDA-1088. Where should I keep my medication? This medication is given in a hospital or clinic. It will not be stored at home. NOTE: This sheet is a summary. It may not cover all possible information. If you have questions about this medicine, talk to your doctor, pharmacist, or health care provider.  2024 Elsevier/Gold Standard (2021-10-30 00:00:00)  Fluorouracil  Injection What is this medication? FLUOROURACIL  (flure oh YOOR a sil) treats some types of cancer. It works by slowing down the growth of cancer cells. This medicine may be used for other purposes; ask your health care provider or pharmacist if you have questions. COMMON BRAND NAME(S): Adrucil  What should I tell my care team before I take this medication? They need to know if you have any of these conditions: Blood disorders Dihydropyrimidine dehydrogenase (DPD) deficiency Infection, such as chickenpox, cold sores, herpes Kidney disease Liver disease Poor nutrition Recent or ongoing radiation therapy An unusual or allergic reaction to fluorouracil , other medications, foods, dyes, or preservatives If you or your partner are pregnant or trying to get pregnant Breast-feeding How should I use this medication? This medication is injected into a vein. It is administered by your care team in a  hospital or clinic setting. Talk to your care team about the use of this medication in children. Special care may be needed. Overdosage: If you think you have taken too much of this medicine contact a poison control center or emergency room at once. NOTE: This medicine is only for you. Do not share this medicine with others. What if I miss a dose? Keep appointments for follow-up doses. It is important not to miss your dose. Call your care team if you are unable to keep an appointment. What may interact with this medication? Do not take this medication with any of the following: Live virus vaccines This medication may also interact with the following: Medications that treat or prevent blood clots, such as warfarin, enoxaparin , dalteparin This list may not describe all possible interactions. Give your health care provider a  list of all the medicines, herbs, non-prescription drugs, or dietary supplements you use. Also tell them if you smoke, drink alcohol, or use illegal drugs. Some items may interact with your medicine. What should I watch for while using this medication? Your condition will be monitored carefully while you are receiving this medication. This medication may make you feel generally unwell. This is not uncommon as chemotherapy can affect healthy cells as well as cancer cells. Report any side effects. Continue your course of treatment even though you feel ill unless your care team tells you to stop. In some cases, you may be given additional medications to help with side effects. Follow all directions for their use. This medication may increase your risk of getting an infection. Call your care team for advice if you get a fever, chills, sore throat, or other symptoms of a cold or flu. Do not treat yourself. Try to avoid being around people who are sick. This medication may increase your risk to bruise or bleed. Call your care team if you notice any unusual bleeding. Be careful brushing  or flossing your teeth or using a toothpick because you may get an infection or bleed more easily. If you have any dental work done, tell your dentist you are receiving this medication. Avoid taking medications that contain aspirin, acetaminophen , ibuprofen, naproxen, or ketoprofen unless instructed by your care team. These medications may hide a fever. Do not treat diarrhea with over the counter products. Contact your care team if you have diarrhea that lasts more than 2 days or if it is severe and watery. This medication can make you more sensitive to the sun. Keep out of the sun. If you cannot avoid being in the sun, wear protective clothing and sunscreen. Do not use sun lamps, tanning beds, or tanning booths. Talk to your care team if you or your partner wish to become pregnant or think you might be pregnant. This medication can cause serious birth defects if taken during pregnancy and for 3 months after the last dose. A reliable form of contraception is recommended while taking this medication and for 3 months after the last dose. Talk to your care team about effective forms of contraception. Do not father a child while taking this medication and for 3 months after the last dose. Use a condom while having sex during this time period. Do not breastfeed while taking this medication. This medication may cause infertility. Talk to your care team if you are concerned about your fertility. What side effects may I notice from receiving this medication? Side effects that you should report to your care team as soon as possible: Allergic reactions--skin rash, itching, hives, swelling of the face, lips, tongue, or throat Heart attack--pain or tightness in the chest, shoulders, arms, or jaw, nausea, shortness of breath, cold or clammy skin, feeling faint or lightheaded Heart failure--shortness of breath, swelling of the ankles, feet, or hands, sudden weight gain, unusual weakness or fatigue Heart rhythm  changes--fast or irregular heartbeat, dizziness, feeling faint or lightheaded, chest pain, trouble breathing High ammonia level--unusual weakness or fatigue, confusion, loss of appetite, nausea, vomiting, seizures Infection--fever, chills, cough, sore throat, wounds that don't heal, pain or trouble when passing urine, general feeling of discomfort or being unwell Low red blood cell level--unusual weakness or fatigue, dizziness, headache, trouble breathing Pain, tingling, or numbness in the hands or feet, muscle weakness, change in vision, confusion or trouble speaking, loss of balance or coordination, trouble walking, seizures Redness, swelling, and  blistering of the skin over hands and feet Severe or prolonged diarrhea Unusual bruising or bleeding Side effects that usually do not require medical attention (report to your care team if they continue or are bothersome): Dry skin Headache Increased tears Nausea Pain, redness, or swelling with sores inside the mouth or throat Sensitivity to light Vomiting This list may not describe all possible side effects. Call your doctor for medical advice about side effects. You may report side effects to FDA at 1-800-FDA-1088. Where should I keep my medication? This medication is given in a hospital or clinic. It will not be stored at home. NOTE: This sheet is a summary. It may not cover all possible information. If you have questions about this medicine, talk to your doctor, pharmacist, or health care provider.  2024 Elsevier/Gold Standard (2021-10-02 00:00:00)  Hypomagnesemia Hypomagnesemia is a condition in which the level of magnesium  in the blood is too low. Magnesium  is a mineral that is found in many foods. It is used in many different processes in the body. Hypomagnesemia can affect every organ in the body. In severe cases, it can cause life-threatening problems. What are the causes? This condition may be caused by: Not getting enough magnesium   in your diet or not having enough healthy foods to eat (malnutrition). Problems with magnesium  absorption in the intestines. Dehydration. Excessive use of alcohol. Vomiting. Severe or long-term (chronic) diarrhea. Some medicines, including medicines that make you urinate more often (diuretics). Certain diseases, such as kidney disease, diabetes, celiac disease, and overactive thyroid . What are the signs or symptoms? Symptoms of this condition include: Loss of appetite, nausea, and vomiting. Involuntary shaking or trembling of a body part (tremor). Muscle weakness or tingling in the arms and legs. Sudden tightening of muscles (muscle spasms). Confusion. Psychiatric issues, such as: Depression and irritability. Psychosis. A feeling of fluttering of the heart (palpitations). Seizures. These symptoms are more severe if magnesium  levels drop suddenly. How is this diagnosed? This condition may be diagnosed based on: Your symptoms and medical history. A physical exam. Blood and urine tests. How is this treated? Treatment depends on the cause and the severity of the condition. It may be treated by: Taking a magnesium  supplement. This can be taken in pill form. If the condition is severe, magnesium  is usually given through an IV. Making changes to your diet. You may be directed to eat foods that have a lot of magnesium , such as green leafy vegetables, peas, beans, and nuts. Not drinking alcohol. If you are struggling not to drink, ask your health care provider for help. Follow these instructions at home: Eating and drinking     Make sure that your diet includes foods with magnesium . Foods that have a lot of magnesium  in them include: Green leafy vegetables, such as spinach and broccoli. Beans and peas. Nuts and seeds, such as almonds and sunflower seeds. Whole grains, such as whole grain bread and fortified cereals. Drink fluids that contain salts and minerals (electrolytes), such as  sports drinks, when you are active. Do not drink alcohol. General instructions Take over-the-counter and prescription medicines only as told by your health care provider. Take magnesium  supplements as directed if your health care provider tells you to take them. Have your magnesium  levels monitored as told by your health care provider. Keep all follow-up visits. This is important. Contact a health care provider if: You get worse instead of better. Your symptoms return. Get help right away if: You develop severe muscle weakness. You have  trouble breathing. You feel that your heart is racing. These symptoms may represent a serious problem that is an emergency. Do not wait to see if the symptoms will go away. Get medical help right away. Call your local emergency services (911 in the [place] Do not drive yourself to the hospital. Summary Hypomagnesemia is a condition in which the level of magnesium  in the blood is too low. Hypomagnesemia can affect every organ in the body. Treatment may include eating more foods that contain magnesium , taking magnesium  supplements, and not drinking alcohol. Have your magnesium  levels monitored as told by your health care provider. This information is not intended to replace advice given to you by your health care provider. Make sure you discuss any questions you have with your health care provider. Document Revised: 10/24/2020 Document Reviewed: 10/24/2020 Elsevier Patient Education  2024 Elsevier Inc.  Hypokalemia Hypokalemia means that the amount of potassium in the blood is lower than normal. Potassium is a mineral (electrolyte) that helps regulate the amount of fluid in the body. It also stimulates muscle tightening (contraction) and helps nerves work properly. Normally, most of the body's potassium is inside cells, and only a very small amount is in the blood. Because the amount in the blood is so small, minor changes to potassium levels in the blood can  be life-threatening. What are the causes? This condition may be caused by: Antibiotic medicine. Diarrhea or vomiting. Taking too much of a medicine that helps you have a bowel movement (laxative) can cause diarrhea and lead to hypokalemia. Chronic kidney disease (CKD). Medicines that help the body get rid of excess fluid (diuretics). Eating disorders, such as anorexia or bulimia. Low magnesium  levels in the body. Sweating a lot. What are the signs or symptoms? Symptoms of this condition include: Weakness. Constipation. Fatigue. Muscle cramps. Mental confusion. Skipped heartbeats or irregular heartbeat (palpitations). Tingling or numbness. How is this diagnosed? This condition is diagnosed with a blood test. How is this treated? This condition may be treated by: Taking potassium supplements. Adjusting the medicines that you take. Eating more foods that contain a lot of potassium. If your potassium level is very low, you may need to get potassium through an IV and be monitored in the hospital. Follow these instructions at home: Eating and drinking  Eat a healthy diet. A healthy diet includes fresh fruits and vegetables, whole grains, healthy fats, and lean proteins. If told, eat more foods that contain a lot of potassium. These include: Nuts, such as peanuts and pistachios. Seeds, such as sunflower seeds and pumpkin seeds. Peas, lentils, and lima beans. Whole grain and bran cereals and breads. Fresh fruits and vegetables, such as apricots, avocado, bananas, cantaloupe, kiwi, oranges, tomatoes, asparagus, and potatoes. Juices, such as orange, tomato, and prune. Lean meats, including fish. Milk and milk products, such as yogurt. General instructions Take over-the-counter and prescription medicines only as told by your health care provider. This includes vitamins, natural food products, and supplements. Keep all follow-up visits. This is important. Contact a health care provider  if: You have weakness that gets worse. You feel your heart pounding or racing. You vomit. You have diarrhea. You have diabetes and you have trouble keeping your blood sugar in your target range. Get help right away if: You have chest pain. You have shortness of breath. You have vomiting or diarrhea that lasts for more than 2 days. You faint. These symptoms may be an emergency. Get help right away. Call 911. Do not wait to  see if the symptoms will go away. Do not drive yourself to the hospital. Summary Hypokalemia means that the amount of potassium in the blood is lower than normal. This condition is diagnosed with a blood test. Hypokalemia may be treated by taking potassium supplements, adjusting the medicines that you take, or eating more foods that are high in potassium. If your potassium level is very low, you may need to get potassium through an IV and be monitored in the hospital. This information is not intended to replace advice given to you by your health care provider. Make sure you discuss any questions you have with your health care provider. Document Revised: 02/08/2021 Document Reviewed: 02/08/2021 Elsevier Patient Education  2024 [employer].

## 2023-07-16 NOTE — Progress Notes (Signed)
 [facility] OFFICE PROGRESS NOTE   Diagnosis: Pancreas cancer  INTERVAL HISTORY:   Ms. Jasmer completed another cycle of 5-FU/liposomal irinotecan  on 07/02/2023.  No nausea/vomiting, diarrhea, or mouth sores.  She has persistent neuropathy symptoms in the hands and feet.  The neuropathy is worse at night.  She has a good appetite, but is concerned she continues to lose weight.  Objective:  Vital signs in last 24 hours:  Blood pressure 131/77, pulse 84, temperature 97.9 F (36.6 C), temperature source Temporal, resp. rate 18, height 5' 8 (1.727 m), weight 173 lb (78.5 kg), last menstrual period 09/17/2012, SpO2 100%.    HEENT: No thrush or ulcers Resp: Lungs clear bilaterally Cardio: Rate and rhythm GI: No mass, nontender, no hepatosplenomegaly Vascular: No leg edema   Portacath/PICC-without erythema  Lab Results:  Lab Results  Component Value Date   WBC 5.0 07/16/2023   HGB 9.5 (L) 07/16/2023   HCT 30.3 (L) 07/16/2023   MCV 95.3 07/16/2023   PLT 408 (H) 07/16/2023   NEUTROABS 3.1 07/16/2023    CMP  Lab Results  Component Value Date   NA 136 07/16/2023   K 3.7 07/16/2023   CL 105 07/16/2023   CO2 27 07/16/2023   GLUCOSE 125 (H) 07/16/2023   BUN 15 07/16/2023   CREATININE 1.11 (H) 07/16/2023   CALCIUM  8.8 (L) 07/16/2023   PROT 7.2 07/16/2023   ALBUMIN  3.3 (L) 07/16/2023   AST 15 07/16/2023   ALT 16 07/16/2023   ALKPHOS 203 (H) 07/16/2023   BILITOT 0.6 07/16/2023   GFRNONAA 58 (L) 07/16/2023   GFRAA >60 02/07/2020    Lab Results  Component Value Date   CAN199 489 (H) 07/02/2023    Lab Results  Component Value Date   INR 1.0 01/27/2023   LABPROT 13.6 01/27/2023    Imaging:  No results found.  Medications: I have reviewed the patient's current medications.   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 Tumor invades the duodenal wall and vascular groove, resection margins  negative, 4/34 lymph nodes positive MSI-stable, tumor showed instability in 2 loci as did adjacent normal tissue Foundation 1-K-ras G12 V, microsatellite status and tumor mutation burden cannot be determined EUS FNA biopsy of pancreas mass on 07/03/2018-well-differentiated neuroendocrine tumor CTs 01/29/2019-ill-defined pancreas head mass, 5 pulmonary nodules-1 with a small amount of central cavitation, tumor abuts the left margin of the portal vein indistinct density surrounding, hepatic artery, complex cystic lesion of the right kidney, right adrenal mass-characterized as an adenoma on a [facility] MRI 12/21/2018 [facility]  03/03/2019-no focal pancreas activity, no tracer accumulation within the suspicious pulmonary nodules, left uterine mass with tracer accumulation felt to represent a leiomyoma Elevated preoperative CA 19-9--CA 19-9 186 on 01/18/2019 CT chest 04/16/2019-multiple bilateral pulmonary nodules, some with increased cavitation, stable in size Cycle 1 FOLFIRINOX 04/27/2019 Cycle 2 FOLFIRINOX 05/11/2019 Cycle 3 FOLFIRINOX 05/23/2019 Cycle 4 FOLFIRINOX 06/08/2019 Cycle 5 FOLFIRINOX 06/22/2019 CT chest 07/02/2019-stable size of bilateral pulmonary nodules.  Dominant cavitary lesions in both lungs show increased cavitation with thinner walls.  Stable 2.1 cm right adrenal nodule. Cycle 6 FOLFIRINOX 07/06/2019 Cycle 7 FOLFIRINOX 07/21/2019 Cycle 8 FOLFIRINOX 08/03/2019, oxaliplatin  deleted secondary to neuropathy CT chest 08/24/2019-decreased size of several lung nodules with resolution of a left upper lobe nodule, no new nodules Radiation to the pancreas surgical area with concurrent Xeloda  09/13/2019-10/20/2019  CTs 11/29/2019-multiple small pulmonary nodules scattered throughout the lungs bilaterally, appear increased in number and size. No definite evidence of metastatic  disease in the abdomen or pelvis. Markedly enlarged and heterogeneous appearing uterus, likely to represent multifocal fibroids. 1 of these  lesions appears to be an exophytic subserosal fibroid in the posterior lateral aspect of the uterine body on the left side although this comes in close proximity to the left adnexa such that a primary ovarian lesion is difficult to completely exclude. CTs 02/07/2020-slight enlargement of bilateral lung nodules, some are cavitary, no evidence of metastatic disease in the abdomen or pelvis, stable right adrenal nodule, uterine fibroids CTs 04/26/2020-mild enlargement of pulmonary nodules, slight increase in trace pelvic fluid, new soft tissue thickening inferior to the cecal tip suspicious for peritoneal metastasis CT 05/26/2020-improved appearance of soft tissue at the inferior tip of the cecum, mildly thickened short appendix-findings suggestive of resolving appendicitis, stable small bibasilar pulmonary nodules, fibroids Plan biopsy of right cecal tip soft tissue canceled secondary to radiologic improvement CT chest 08/02/2020-enlargement and progressive cavitation of multiple bilateral lung nodules.  Some new nodules are present. CTs 10/24/2020- increase in size of pulmonary nodules, no new nodules, no evidence of metastatic disease in the abdomen, stable right adrenal nodule CT 01/09/2021-slight interval enlargement of pulmonary nodules, stable right adrenal nodule Navigation bronchoscopy 01/30/2021-left lower lobe cavitary nodule FNA-adenocarcinoma, brushing-adenocarcinoma.  Left lower lobe lavage-adenocarcinoma.  Right upper lobe brushing and FNA biopsy of cavitary nodule-adenocarcinoma-immunohistochemical profile consistent with pancreas adenocarcinoma Cycle 1 gemcitabine /Abraxane  03/28/2021 Cycle 2 gemcitabine /Abraxane  04/11/2021 Cycle 3 gemcitabine /Abraxane  04/25/2021 Cycle 4 gemcitabine /Abraxane  05/09/2021 Cycle 5 gemcitabine /Abraxane  05/23/2021 CT chest 06/05/2021-interval cavitation of multiple pulmonary nodules, some nodules have decreased in size, no new or enlarging nodules Cycle 6  gemcitabine /Abraxane  06/06/2021 Cycle 7 gemcitabine /Abraxane  06/21/2021 Cycle 8 gemcitabine /Abraxane  07/05/2021 Cycle 9 Gemcitabine /Abraxane  07/19/2021 Cycle 10 gemcitabine  08/01/2021-Abraxane  held secondary to neuropathy CT chest 08/13/2021-mild decrease in size and wall thickness of multiple cavitary nodules, no new or progressive disease in the chest, indeterminate low-attenuation right liver lesions Cycle 11 gemcitabine  08/16/2021-Abraxane  held secondary to neuropathy Cycle 12 gemcitabine  08/30/2021-Abraxane  held secondary to neuropathy Cycle 13 gemcitabine  09/13/2021-Abraxane  held secondary to neuropathy Cycle 14 gemcitabine  09/27/2021-Abraxane  held secondary to neuropathy Cycle 15 gemcitabine  10/11/2021-Abraxane  held secondary to neuropathy CTs 10/22/2021-no change in multiple cavitary lung nodules, no evidence of disease progression, ill-defined hypodense lesion in the posterior right liver suspicious for metastatic disease Cycle 16 gemcitabine  10/25/2021 Cycle 17 gemcitabine  11/08/2021 Cycle 18 Gemcitabine  11/22/2021 Cycle 19 gemcitabine  12/06/2021 Cycle 20 Gemcitabine  12/20/2021 CT 12/31/2021-mild increase in size of bilateral pulmonary metastases, stable subtle continuation right liver lesions Cycle 20 gemcitabine /Abraxane  01/03/2022 Cycle 21 gemcitabine /Abraxane  01/17/2022 Cycle 22 gemcitabine /Abraxane  01/31/2022 Cycle 23 gemcitabine /Abraxane  02/14/2022 Cycle 24 gemcitabine /Abraxane  02/28/2022 CTs 03/11/2022-widespread metastatic disease to the lungs again noted with slight involution of several of the pulmonary nodules, no definite new nodules noted; interval cavitation of solid lesion in the posterior aspect right lobe of the liver, no new liver lesions noted. Cycle 25 Gemcitabine /Abraxane  03/14/2022 Cycle 26 gemcitabine /Abraxane  03/28/2022 Cycle 27 gemcitabine /Abraxane  04/25/2022 Cycle 28 gemcitabine /Abraxane  05/09/2022 Cycle 29 Gemcitabine  05/23/2022, Abraxane  held due to neuropathy CTs  06/04/2022-stable multifocal cavitary pulmonary nodules.  No definite new nodules.  No new nodules greater than a centimeter.  Decreased size of the right posterior hepatic lobe lesion.  Generalized heterogeneity and new focal areas of hypodensity in the liver. Cycle 30 Gemcitabine  06/06/2022, Abraxane  held due to neuropathy Cycle 31 gemcitabine  06/20/2022, Abraxane  held due to neuropathy Cycle 32 gemcitabine  07/04/2022, Abraxane  held due to neuropathy Cycle 33 gemcitabine   07/18/2022 ,Abraxane  held due to neuropathy Cycle 34 gemcitabine  08/15/2022, Abraxane  held due to neuropathy  Cycle 35 gemcitabine  08/29/2022, Abraxane  held due to neuropathy CTs 09/10/2022-enlarging pulmonary metastases.  Subtle poorly defined hypoattenuating lesions in the liver, less well-seen than on 06/04/2022.  Suspected new lesion in the dome of the right hepatic lobe Cycle 36 gemcitabine /Abraxane  09/12/2022 Cycle 37 gemcitabine /Abraxane  09/26/2022 Cycle 38 gemcitabine /Abraxane  10/10/2022 Cycle 39 gemcitabine /Abraxane  11/07/2022 Cycle 40 gemcitabine /Abraxane  11/21/2022 Cycle 41 gemcitabine /Abraxane  12/05/2022 Cycle 42 gemcitabine /Abraxane  12/19/2022 CT is 12/30/2022-new left hepatic lesion, stable bilateral pulmonary metastases Cycle 43 gemcitabine /Abraxane  01/02/2023 CT-guided biopsy of liver lesion 01/27/2023-acute/chronic inflammation, negative for malignancy, culture negative Cycle 44 gemcitabine /Abraxane  01/30/2023 Cycle 45 gemcitabine /Abraxane  02/13/2023 Cycle 46 gemcitabine /Abraxane  02/27/2023 Cycle 47 gemcitabine /Abraxane  03/13/2023 Cycle 48 gemcitabine /Abraxane  03/27/2023 CTs 04/21/2023-numerous solid and cavitary lung nodules overall similar, some slightly enlarged; multiple new and enlarging rim-enhancing liver lesions. 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  Apixaban  10/24/2022       Disposition: Ms. Dettmann appears stable.  She is tolerating the current chemotherapy well.  She will complete another cycle today.  The CA 19-9 was higher when she was here on 07/02/2023.  We will follow-up on the CA 19-9 from today.  We will check her status on the K-ras clinical trial wait list at [facility].  She will return for an office visit and chemotherapy in 2 weeks.  We will plan for a restaging CT evaluation after cycle 5 or cycle 6 FOLFIRI.  Arley Hof, MD  07/16/2023  10:02 AM

## 2023-07-16 NOTE — Progress Notes (Signed)
 Patient seen by Dr. Arley Hof today  Vitals are within treatment parameters:Yes   Labs are within treatment parameters: Yes   Treatment plan has been signed: Yes   Per physician team, Patient is ready for treatment and there are NO modifications to the treatment plan.

## 2023-07-18 ENCOUNTER — Inpatient Hospital Stay: Payer: BC Managed Care – PPO

## 2023-07-18 VITALS — BP 110/70 | HR 78 | Temp 98.1°F | Resp 18

## 2023-07-18 DIAGNOSIS — Z79899 Other long term (current) drug therapy: Secondary | ICD-10-CM | POA: Diagnosis not present

## 2023-07-18 DIAGNOSIS — C250 Malignant neoplasm of head of pancreas: Secondary | ICD-10-CM

## 2023-07-18 DIAGNOSIS — C7802 Secondary malignant neoplasm of left lung: Secondary | ICD-10-CM | POA: Diagnosis not present

## 2023-07-18 DIAGNOSIS — Z5111 Encounter for antineoplastic chemotherapy: Secondary | ICD-10-CM | POA: Diagnosis not present

## 2023-07-18 DIAGNOSIS — Z452 Encounter for adjustment and management of vascular access device: Secondary | ICD-10-CM | POA: Diagnosis not present

## 2023-07-18 DIAGNOSIS — C7801 Secondary malignant neoplasm of right lung: Secondary | ICD-10-CM | POA: Diagnosis not present

## 2023-07-18 LAB — CANCER ANTIGEN 19-9: CA 19-9: 441 U/mL — ABNORMAL HIGH (ref 0–35)

## 2023-07-18 MED ORDER — SODIUM CHLORIDE 0.9% FLUSH
10.0000 mL | INTRAVENOUS | Status: DC | PRN
  Administered 2023-07-18: 10 mL

## 2023-07-18 MED ORDER — HEPARIN SOD (PORK) LOCK FLUSH 100 UNIT/ML IV SOLN
500.0000 [IU] | Freq: Once | INTRAVENOUS | Status: AC | PRN
  Administered 2023-07-18: 500 [IU]

## 2023-07-18 NOTE — Patient Instructions (Signed)

## 2023-07-18 NOTE — Progress Notes (Signed)
 Patient refused Udenyca Injection.

## 2023-07-21 ENCOUNTER — Other Ambulatory Visit: Payer: Self-pay

## 2023-07-21 ENCOUNTER — Encounter: Payer: Self-pay | Admitting: Nurse Practitioner

## 2023-07-21 DIAGNOSIS — C250 Malignant neoplasm of head of pancreas: Secondary | ICD-10-CM

## 2023-07-21 DIAGNOSIS — E876 Hypokalemia: Secondary | ICD-10-CM

## 2023-07-21 DIAGNOSIS — Z1231 Encounter for screening mammogram for malignant neoplasm of breast: Secondary | ICD-10-CM | POA: Diagnosis not present

## 2023-07-21 LAB — HM MAMMOGRAPHY

## 2023-07-21 MED ORDER — POTASSIUM CHLORIDE CRYS ER 20 MEQ PO TBCR: 20.0000 meq | EXTENDED_RELEASE_TABLET | Freq: Three times a day (TID) | ORAL | 1 refills | Status: DC

## 2023-07-22 DIAGNOSIS — C259 Malignant neoplasm of pancreas, unspecified: Secondary | ICD-10-CM | POA: Diagnosis not present

## 2023-07-25 ENCOUNTER — Other Ambulatory Visit: Payer: Self-pay | Admitting: Oncology

## 2023-07-29 NOTE — Progress Notes (Unsigned)
Patient Care Team: Dorothyann Peng, MD as PCP - General (Internal Medicine) Placke, Dawn, RN (Inactive) as Oncology Nurse Navigator Pickenpack-Cousar, Arty Baumgartner, NP as Nurse Practitioner (Hospice and Palliative Medicine)   CHIEF COMPLAINT: Follow up pancreatic cancer   CURRENT THERAPY: 5FU/Liposomal irinotecan q2 weeks   INTERVAL HISTORY Alyssa Ross returns for follow up and treatment, last seen by Dr. Truett Perna 07/16/23.   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   Yeast vaginitis 2006     Past Surgical History:  Procedure Laterality Date   BILIARY STENT PLACEMENT N/A 12/25/2018   Procedure: BILIARY STENT PLACEMENT;  Surgeon: Jeani Hawking, MD;  Location: [facility];  Service: Endoscopy;  Laterality: N/A;   BILIARY STENT PLACEMENT N/A 01/01/2019   Procedure: BILIARY STENT PLACEMENT;  Surgeon: Jeani Hawking, MD;  Location: [facility];  Service: Endoscopy;  Laterality: N/A;   BRONCHIAL BIOPSY  01/30/2021   Procedure: BRONCHIAL BIOPSIES;  Surgeon: Josephine Igo, DO;  Location: [facility];  Service: Pulmonary;;   BRONCHIAL BRUSHINGS  01/30/2021   Procedure: BRONCHIAL BRUSHINGS;  Surgeon: Josephine Igo, DO;  Location: [facility];  Service: Pulmonary;;   BRONCHIAL NEEDLE ASPIRATION BIOPSY  01/30/2021   Procedure: BRONCHIAL NEEDLE ASPIRATION BIOPSIES;  Surgeon: Josephine Igo, DO;  Location: [facility];  Service: Pulmonary;;   BRONCHIAL WASHINGS  01/30/2021   Procedure: BRONCHIAL WASHINGS;  Surgeon: Josephine Igo, DO;  Location: [facility];  Service: Pulmonary;;   DILATATION & CURETTAGE/HYSTEROSCOPY WITH TRUECLEAR N/A 01/20/2014   Procedure: DILATATION & CURETTAGE/HYSTEROSCOPY WITH TRUCLEAR;  Surgeon: Michael Litter, MD;  Location: [facility];  Service: Gynecology;  Laterality: N/A;   DILATATION & CURRETTAGE/HYSTEROSCOPY WITH RESECTOCOPE N/A 01/20/2014   Procedure: DILATATION & CURETTAGE/HYSTEROSCOPY WITH RESECTOCOPE;  Surgeon: Michael Litter, MD;  Location: [facility];  Service: Gynecology;  Laterality: N/A;   ENDOSCOPIC RETROGRADE CHOLANGIOPANCREATOGRAPHY (ERCP) WITH PROPOFOL N/A 12/25/2018   Procedure: ENDOSCOPIC RETROGRADE CHOLANGIOPANCREATOGRAPHY (ERCP) WITH PROPOFOL;  Surgeon: Jeani Hawking, MD;  Location: [facility];  Service: Endoscopy;  Laterality: N/A;   ENDOSCOPIC RETROGRADE CHOLANGIOPANCREATOGRAPHY (ERCP) WITH PROPOFOL N/A 01/01/2019   Procedure: ENDOSCOPIC RETROGRADE CHOLANGIOPANCREATOGRAPHY (ERCP) WITH PROPOFOL;  Surgeon: Jeani Hawking, MD;  Location: [facility];  Service: Endoscopy;  Laterality: N/A;   ERCP  12/25/2018   FINE NEEDLE ASPIRATION N/A 01/01/2019   Procedure: FINE NEEDLE ASPIRATION (FNA) LINEAR;  Surgeon: Jeani Hawking, MD;  Location: [facility];  Service: Endoscopy;  Laterality: N/A;   LAPAROSCOPY N/A 03/04/2019   Procedure: LAPAROSCOPY DIAGNOSTIC;  Surgeon: Almond Lint, MD;  Location: [facility];  Service: General;  Laterality: N/A;  GENERAL AND EPIDURAL   PARTIAL NEPHRECTOMY Right 03/04/2019   Procedure: Nephrectomy Partial;  Surgeon: Malen Gauze, MD;  [facility]: [facility];  Service: Urology;  Laterality: Right;   PORTACATH PLACEMENT Left 04/21/2019  Procedure: INSERTION PORT-A-CATH;  Surgeon: Almond Lint, MD;  Location: [facility] OR;  Service: General;  Laterality: Left;   SPHINCTEROTOMY  12/25/2018   Procedure:  Dennison Mascot;  Surgeon: Jeani Hawking, MD;  Location: [facility];  Service: Endoscopy;;   STENT REMOVAL  01/01/2019   Procedure: STENT REMOVAL;  Surgeon: Jeani Hawking, MD;  Location: [facility];  Service: Endoscopy;;   UPPER ESOPHAGEAL ENDOSCOPIC ULTRASOUND (EUS) N/A 01/01/2019   Procedure: UPPER ESOPHAGEAL ENDOSCOPIC ULTRASOUND (EUS);  Surgeon: Jeani Hawking, MD;  Location: [facility];  Service: Endoscopy;  Laterality: N/A;   VIDEO BRONCHOSCOPY WITH ENDOBRONCHIAL NAVIGATION Bilateral 01/30/2021   Procedure: VIDEO BRONCHOSCOPY WITH ENDOBRONCHIAL NAVIGATION;  Surgeon: Josephine Igo, DO;  Location: [facility];  Service: Pulmonary;  Laterality: Bilateral;  ION   VIDEO BRONCHOSCOPY WITH RADIAL ENDOBRONCHIAL ULTRASOUND  01/30/2021   Procedure: RADIAL ENDOBRONCHIAL ULTRASOUND;  Surgeon: Josephine Igo, DO;  Location: [facility];  Service: Pulmonary;;   WHIPPLE PROCEDURE N/A 03/04/2019   Procedure: WHIPPLE PROCEDURE;  Surgeon: Almond Lint, MD;  Location: [facility];  Service: General;  Laterality: N/A;  GENERAL AND EPIDURAL     Outpatient Encounter Medications as of 07/30/2023  Medication Sig Note   apixaban (ELIQUIS) 5 MG TABS tablet Take 1 tablet (5 mg total) by mouth 2 (two) times daily.    cetirizine (ZYRTEC) 10 MG tablet Take 10 mg by mouth daily.    Continuous Blood Gluc Sensor (FREESTYLE LIBRE 2 SENSOR) MISC APPLY EVERY 14 DAYS    glucose blood test strip Check sugar 2 times daily.    Insulin Degludec (TRESIBA FLEXTOUCH [place]) Inject into the skin. 12-18 Units 07/03/2023: 17 on non steroid days   insulin lispro (HUMALOG KWIKPEN) 200 UNIT/ML KwikPen 6 units with breakfast, 13 units with lunch, 16 units with dinner 08/15/2022: Dose varies   Insulin Pen Needle (BD PEN NEEDLE NANO U/F) 32G X 4 MM MISC USE TO INJECT INSULIN THREE TIMES A DAY    lidocaine-prilocaine (EMLA) cream Apply 1 Application topically as directed. Apply 1 hour prior to stick and cover with plastic wrap    magic  mouthwash (nystatin, diphenhydrAMINE, alum & mag hydroxide) suspension mixture Take 5 mLs by mouth 4 (four) times daily as needed for mouth pain. (Patient not taking: Reported on 07/16/2023)    magnesium oxide (MAG-OX) 400 (240 Mg) MG tablet TAKE 1 TABLET(400 MG) BY MOUTH TWICE DAILY    ondansetron (ZOFRAN) 8 MG tablet Take 1 tablet (8 mg total) by mouth every 8 (eight) hours as needed for nausea or vomiting (do not begin until 72 hours after chemo). (Patient not taking: Reported on 06/05/2023)    potassium chloride SA (KLOR-CON M) 20 MEQ tablet Take 1 tablet (20 mEq total) by mouth 3 (three) times daily.    prochlorperazine (COMPAZINE) 10 MG tablet TAKE 1 TABLET(10 MG) BY MOUTH EVERY 6 HOURS AS NEEDED FOR NAUSEA OR VOMITING (Patient not taking: Reported on 07/16/2023)    rosuvastatin (CRESTOR) 10 MG tablet TAKE 1 TABLET AT BEDTIME    spironolactone (ALDACTONE) 25 MG tablet TAKE 1 TABLET(25 MG) BY MOUTH DAILY 07/03/2023: Takes when need based on fluid   valsartan (DIOVAN) 160 MG tablet Take 1 tablet (160 mg total) by mouth daily. (Patient not taking: Reported on 07/16/2023) 07/16/2023: PCP said to take 80 mg if needed   Facility-Administered Encounter Medications as of 07/30/2023  Medication   sodium chloride flush (NS) 0.9 % injection 10 mL     There were no vitals filed for this visit. There  is no height or weight on file to calculate BMI.   PHYSICAL EXAM GENERAL:alert, no distress and comfortable SKIN: no rash  EYES: sclera clear NECK: without mass LYMPH:  no palpable cervical or supraclavicular lymphadenopathy  LUNGS: clear with normal breathing effort HEART: regular rate & rhythm, no lower extremity edema ABDOMEN: abdomen soft, non-tender and normal bowel sounds NEURO: alert & oriented x 3 with fluent speech, no focal motor/sensory deficits Breast exam:  PAC without erythema    CBC    Component Value Date/Time   WBC 5.0 07/16/2023 0905   WBC 5.9 01/27/2023 0833   RBC 3.18 (L) 07/16/2023  0905   HGB 9.5 (L) 07/16/2023 0905   HGB 11.8 07/31/2020 1113   HGB 11.6 07/15/2014 1048   HCT 30.3 (L) 07/16/2023 0905   HCT 36.8 07/31/2020 1113   HCT 35.8 07/15/2014 1048   PLT 408 (H) 07/16/2023 0905   PLT 340 07/31/2020 1113   MCV 95.3 07/16/2023 0905   MCV 95 07/31/2020 1113   MCV 88.6 07/15/2014 1048   MCH 29.9 07/16/2023 0905   MCHC 31.4 07/16/2023 0905   RDW 18.7 (H) 07/16/2023 0905   RDW 12.5 07/31/2020 1113   RDW 12.9 07/15/2014 1048   LYMPHSABS 1.4 07/16/2023 0905   LYMPHSABS 2.4 07/15/2014 1048   MONOABS 0.4 07/16/2023 0905   MONOABS 0.3 07/15/2014 1048   EOSABS 0.1 07/16/2023 0905   EOSABS 0.2 07/15/2014 1048   BASOSABS 0.1 07/16/2023 0905   BASOSABS 0.0 07/15/2014 1048     CMP     Component Value Date/Time   NA 136 07/16/2023 0905   NA 141 08/29/2021 0000   NA 141 07/15/2014 1048   K 3.7 07/16/2023 0905   K 3.9 07/15/2014 1048   CL 105 07/16/2023 0905   CO2 27 07/16/2023 0905   CO2 27 07/15/2014 1048   GLUCOSE 125 (H) 07/16/2023 0905   GLUCOSE 169 (H) 07/15/2014 1048   BUN 15 07/16/2023 0905   BUN 6 08/29/2021 0000   BUN 16.6 07/15/2014 1048   CREATININE 1.11 (H) 07/16/2023 0905   CREATININE 0.9 07/15/2014 1048   CALCIUM 8.8 (L) 07/16/2023 0905   CALCIUM 9.2 07/15/2014 1048   PROT 7.2 07/16/2023 0905   PROT 7.1 07/31/2020 1113   PROT 7.4 07/15/2014 1048   ALBUMIN 3.3 (L) 07/16/2023 0905   ALBUMIN 4.1 07/31/2020 1113   ALBUMIN 3.7 07/15/2014 1048   AST 15 07/16/2023 0905   AST 16 07/15/2014 1048   ALT 16 07/16/2023 0905   ALT 15 07/15/2014 1048   ALKPHOS 203 (H) 07/16/2023 0905   ALKPHOS 62 07/15/2014 1048   BILITOT 0.6 07/16/2023 0905   BILITOT 0.42 07/15/2014 1048   GFRNONAA 58 (L) 07/16/2023 0905   GFRAA >60 02/07/2020 1035     ASSESSMENT & PLAN: 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 Tumor invades the duodenal wall and vascular groove, resection margins negative, 4/34  lymph nodes positive MSI-stable, tumor showed instability in 2 loci as did adjacent normal tissue Foundation 1-K-ras G12 V, microsatellite status and tumor mutation burden cannot be determined EUS FNA biopsy of pancreas mass on 07/03/2018-well-differentiated neuroendocrine tumor CTs 01/29/2019-ill-defined pancreas head mass, 5 pulmonary nodules-1 with a small amount of central cavitation, tumor abuts the left margin of the portal vein indistinct density surrounding, hepatic artery, complex cystic lesion of the right kidney, right adrenal mass-characterized as an adenoma on a [facility] MRI 12/21/2018 [facility] 03/03/2019-no focal pancreas activity, no tracer accumulation within the  suspicious pulmonary nodules, left uterine mass with tracer accumulation felt to represent a leiomyoma Elevated preoperative CA 19-9--CA 19-9 186 on 01/18/2019 CT chest 04/16/2019-multiple bilateral pulmonary nodules, some with increased cavitation, stable in size Cycle 1 FOLFIRINOX 04/27/2019 Cycle 2 FOLFIRINOX 05/11/2019 Cycle 3 FOLFIRINOX 05/23/2019 Cycle 4 FOLFIRINOX 06/08/2019 Cycle 5 FOLFIRINOX 06/22/2019 CT chest 07/02/2019-stable size of bilateral pulmonary nodules.  Dominant cavitary lesions in both lungs show increased cavitation with thinner walls.  Stable 2.1 cm right adrenal nodule. 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 No definite evidence of metastatic disease in the abdomen or pelvis. Markedly enlarged and heterogeneous appearing uterus, likely to represent multifocal fibroids. 1 of these lesions appears  to be an exophytic subserosal fibroid in the posterior lateral aspect of the uterine body on the left side although this comes in close proximity to the left adnexa such that a primary ovarian lesion is difficult to completely exclude. CTs 02/07/2020-slight enlargement of bilateral lung nodules, some are cavitary, no evidence of metastatic disease in the abdomen or pelvis, stable right adrenal nodule, uterine fibroids CTs 04/26/2020-mild enlargement of pulmonary nodules, slight increase in trace pelvic fluid, new soft tissue thickening inferior to the cecal tip suspicious for peritoneal metastasis CT 05/26/2020-improved appearance of soft tissue at the inferior tip of the cecum, mildly thickened short appendix-findings suggestive of resolving appendicitis, stable small bibasilar pulmonary nodules, fibroids Plan biopsy of right cecal tip soft tissue canceled secondary to radiologic improvement CT chest 08/02/2020-enlargement and progressive cavitation of multiple bilateral lung nodules.  Some new nodules are present. CTs 10/24/2020- increase in size of pulmonary nodules, no new nodules, no evidence of metastatic disease in the abdomen, stable right adrenal nodule CT 01/09/2021-slight interval enlargement of pulmonary nodules, stable right adrenal nodule Navigation bronchoscopy 01/30/2021-left lower lobe cavitary nodule FNA-adenocarcinoma, brushing-adenocarcinoma.  Left lower lobe lavage-adenocarcinoma.  Right upper lobe brushing and FNA biopsy of cavitary nodule-adenocarcinoma-immunohistochemical profile consistent with pancreas adenocarcinoma Cycle 1 gemcitabine/Abraxane 03/28/2021 Cycle 2 gemcitabine/Abraxane 04/11/2021 Cycle 3 gemcitabine/Abraxane 04/25/2021 Cycle 4 gemcitabine/Abraxane 05/09/2021 Cycle 5 gemcitabine/Abraxane 05/23/2021 CT chest 06/05/2021-interval cavitation of multiple pulmonary nodules, some nodules have decreased in size, no new or enlarging nodules Cycle 6 gemcitabine/Abraxane  06/06/2021 Cycle 7 gemcitabine/Abraxane 06/21/2021 Cycle 8 gemcitabine/Abraxane 07/05/2021 Cycle 9 Gemcitabine/Abraxane 07/19/2021 Cycle 10 gemcitabine 08/01/2021-Abraxane held secondary to neuropathy CT chest 08/13/2021-mild decrease in size and wall thickness of multiple cavitary nodules, no new or progressive disease in the chest, indeterminate low-attenuation right liver lesions Cycle 11 gemcitabine 08/16/2021-Abraxane held secondary to neuropathy Cycle 12 gemcitabine 08/30/2021-Abraxane held secondary to neuropathy Cycle 13 gemcitabine 09/13/2021-Abraxane held secondary to neuropathy Cycle 14 gemcitabine 09/27/2021-Abraxane held secondary to neuropathy Cycle 15 gemcitabine 10/11/2021-Abraxane held secondary to neuropathy CTs 10/22/2021-no change in multiple cavitary lung nodules, no evidence of disease progression, ill-defined hypodense lesion in the posterior right liver suspicious for metastatic disease Cycle 16 gemcitabine 10/25/2021 Cycle 17 gemcitabine 11/08/2021 Cycle 18 Gemcitabine 11/22/2021 Cycle 19 gemcitabine 12/06/2021 Cycle 20 Gemcitabine 12/20/2021 CT 12/31/2021-mild increase in size of bilateral pulmonary metastases, stable subtle continuation right liver lesions Cycle 20 gemcitabine/Abraxane 01/03/2022 Cycle 21 gemcitabine/Abraxane 01/17/2022 Cycle 22 gemcitabine/Abraxane 01/31/2022 Cycle 23 gemcitabine/Abraxane 02/14/2022 Cycle 24 gemcitabine/Abraxane 02/28/2022 CTs 03/11/2022-widespread metastatic disease  to the lungs again noted with slight involution of several of the pulmonary nodules, no definite new nodules noted; interval cavitation of solid lesion in the posterior aspect right lobe of the liver, no new liver lesions noted. Cycle 25 Gemcitabine/Abraxane 03/14/2022 Cycle 26 gemcitabine/Abraxane 03/28/2022 Cycle 27 gemcitabine/Abraxane 04/25/2022 Cycle 28 gemcitabine/Abraxane 05/09/2022 Cycle 29 Gemcitabine 05/23/2022, Abraxane held due to neuropathy CTs 06/04/2022-stable multifocal cavitary  pulmonary nodules.  No definite new nodules.  No new nodules greater than a centimeter.  Decreased size of the right posterior hepatic lobe lesion.  Generalized heterogeneity and new focal areas of hypodensity in the liver. Cycle 30 Gemcitabine 06/06/2022, Abraxane held due to neuropathy Cycle 31 gemcitabine 06/20/2022, Abraxane held due to neuropathy Cycle 32 gemcitabine 07/04/2022, Abraxane held due to neuropathy Cycle 33 gemcitabine  07/18/2022 ,Abraxane held due to neuropathy Cycle 34 gemcitabine 08/15/2022, Abraxane held due to neuropathy Cycle 35 gemcitabine 08/29/2022, Abraxane held due to neuropathy CTs 09/10/2022-enlarging pulmonary metastases.  Subtle poorly defined hypoattenuating lesions in the liver, less well-seen than on 06/04/2022.  Suspected new lesion in the dome of the right hepatic lobe Cycle 36 gemcitabine/Abraxane 09/12/2022 Cycle 37 gemcitabine/Abraxane 09/26/2022 Cycle 38 gemcitabine/Abraxane 10/10/2022 Cycle 39 gemcitabine/Abraxane 11/07/2022 Cycle 40 gemcitabine/Abraxane 11/21/2022 Cycle 41 gemcitabine/Abraxane 12/05/2022 Cycle 42 gemcitabine/Abraxane 12/19/2022 CT is 12/30/2022-new left hepatic lesion, stable bilateral pulmonary metastases Cycle 43 gemcitabine/Abraxane 01/02/2023 CT-guided biopsy of liver lesion 01/27/2023-acute/chronic inflammation, negative for malignancy, culture negative Cycle 44 gemcitabine/Abraxane 01/30/2023 Cycle 45 gemcitabine/Abraxane 02/13/2023 Cycle 46 gemcitabine/Abraxane 02/27/2023 Cycle 47 gemcitabine/Abraxane 03/13/2023 Cycle 48 gemcitabine/Abraxane 03/27/2023 CTs 04/21/2023-numerous solid and cavitary lung nodules overall similar, some slightly enlarged; multiple new and enlarging rim-enhancing liver lesions. 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  Apixaban 10/24/2022           PLAN:  No orders of the defined types were placed in this encounter.     All questions were answered. The patient knows to call the clinic with any problems, questions or concerns. No barriers to learning were detected. I spent *** counseling the patient face to face. The total time spent in the appointment was *** and more than 50% was on counseling, review of test results, and coordination of care.   Santiago Glad, NP-C @DATE @

## 2023-07-30 ENCOUNTER — Inpatient Hospital Stay: Payer: BC Managed Care – PPO

## 2023-07-30 ENCOUNTER — Inpatient Hospital Stay (HOSPITAL_BASED_OUTPATIENT_CLINIC_OR_DEPARTMENT_OTHER): Payer: BC Managed Care – PPO | Admitting: Nurse Practitioner

## 2023-07-30 ENCOUNTER — Inpatient Hospital Stay: Payer: BC Managed Care – PPO | Admitting: Oncology

## 2023-07-30 ENCOUNTER — Encounter: Payer: Self-pay | Admitting: Nurse Practitioner

## 2023-07-30 VITALS — BP 134/88 | HR 96 | Temp 98.9°F | Resp 18 | Ht 68.0 in | Wt 175.3 lb

## 2023-07-30 VITALS — BP 119/86 | HR 70

## 2023-07-30 DIAGNOSIS — Z5111 Encounter for antineoplastic chemotherapy: Secondary | ICD-10-CM | POA: Diagnosis not present

## 2023-07-30 DIAGNOSIS — C7802 Secondary malignant neoplasm of left lung: Secondary | ICD-10-CM | POA: Diagnosis not present

## 2023-07-30 DIAGNOSIS — C259 Malignant neoplasm of pancreas, unspecified: Secondary | ICD-10-CM | POA: Diagnosis not present

## 2023-07-30 DIAGNOSIS — Z452 Encounter for adjustment and management of vascular access device: Secondary | ICD-10-CM | POA: Diagnosis not present

## 2023-07-30 DIAGNOSIS — C250 Malignant neoplasm of head of pancreas: Secondary | ICD-10-CM

## 2023-07-30 DIAGNOSIS — C7801 Secondary malignant neoplasm of right lung: Secondary | ICD-10-CM | POA: Diagnosis not present

## 2023-07-30 DIAGNOSIS — E876 Hypokalemia: Secondary | ICD-10-CM | POA: Diagnosis not present

## 2023-07-30 DIAGNOSIS — Z79899 Other long term (current) drug therapy: Secondary | ICD-10-CM | POA: Diagnosis not present

## 2023-07-30 LAB — CBC WITH DIFFERENTIAL (CANCER CENTER ONLY)
Abs Immature Granulocytes: 0.02 10*3/uL (ref 0.00–0.07)
Basophils Absolute: 0 10*3/uL (ref 0.0–0.1)
Basophils Relative: 1 %
Eosinophils Absolute: 0.1 10*3/uL (ref 0.0–0.5)
Eosinophils Relative: 2 %
HCT: 27.3 % — ABNORMAL LOW (ref 36.0–46.0)
Hemoglobin: 8.8 g/dL — ABNORMAL LOW (ref 12.0–15.0)
Immature Granulocytes: 0 %
Lymphocytes Relative: 23 %
Lymphs Abs: 1 10*3/uL (ref 0.7–4.0)
MCH: 30.4 pg (ref 26.0–34.0)
MCHC: 32.2 g/dL (ref 30.0–36.0)
MCV: 94.5 fL (ref 80.0–100.0)
Monocytes Absolute: 0.5 10*3/uL (ref 0.1–1.0)
Monocytes Relative: 11 %
Neutro Abs: 2.8 10*3/uL (ref 1.7–7.7)
Neutrophils Relative %: 63 %
Platelet Count: 421 10*3/uL — ABNORMAL HIGH (ref 150–400)
RBC: 2.89 MIL/uL — ABNORMAL LOW (ref 3.87–5.11)
RDW: 17 % — ABNORMAL HIGH (ref 11.5–15.5)
WBC Count: 4.5 10*3/uL (ref 4.0–10.5)
nRBC: 0 % (ref 0.0–0.2)

## 2023-07-30 LAB — CMP (CANCER CENTER ONLY)
ALT: 12 U/L (ref 0–44)
AST: 13 U/L — ABNORMAL LOW (ref 15–41)
Albumin: 3 g/dL — ABNORMAL LOW (ref 3.5–5.0)
Alkaline Phosphatase: 173 U/L — ABNORMAL HIGH (ref 38–126)
Anion gap: 6 (ref 5–15)
BUN: 5 mg/dL — ABNORMAL LOW (ref 6–20)
CO2: 25 mmol/L (ref 22–32)
Calcium: 8.5 mg/dL — ABNORMAL LOW (ref 8.9–10.3)
Chloride: 109 mmol/L (ref 98–111)
Creatinine: 0.93 mg/dL (ref 0.44–1.00)
GFR, Estimated: >60 mL/min (ref >60)
Glucose, Bld: 72 mg/dL (ref 70–99)
Potassium: 2.8 mmol/L — ABNORMAL LOW (ref 3.5–5.1)
Sodium: 140 mmol/L (ref 135–145)
Total Bilirubin: 0.5 mg/dL (ref 0.0–1.2)
Total Protein: 6.7 g/dL (ref 6.5–8.1)

## 2023-07-30 LAB — MAGNESIUM: Magnesium: 1.6 mg/dL — ABNORMAL LOW (ref 1.7–2.4)

## 2023-07-30 MED ORDER — ATROPINE SULFATE 1 MG/ML IV SOLN
0.5000 mg | Freq: Once | INTRAVENOUS | Status: AC | PRN
  Administered 2023-07-30: 0.5 mg via INTRAVENOUS
  Filled 2023-07-30: qty 1

## 2023-07-30 MED ORDER — SODIUM CHLORIDE 0.9 % IV SOLN
2400.0000 mg/m2 | INTRAVENOUS | Status: DC
  Administered 2023-07-30: 5000 mg via INTRAVENOUS
  Filled 2023-07-30: qty 100

## 2023-07-30 MED ORDER — DEXAMETHASONE SODIUM PHOSPHATE 10 MG/ML IJ SOLN
10.0000 mg | Freq: Once | INTRAMUSCULAR | Status: AC
  Administered 2023-07-30: 10 mg via INTRAVENOUS
  Filled 2023-07-30: qty 1

## 2023-07-30 MED ORDER — SODIUM CHLORIDE 0.9 % IV SOLN
INTRAVENOUS | Status: DC
Start: 2023-07-30 — End: 2023-07-30

## 2023-07-30 MED ORDER — SODIUM CHLORIDE 0.9 % IV SOLN
55.0000 mg/m2 | Freq: Once | INTRAVENOUS | Status: AC
  Administered 2023-07-30: 107.5 mg via INTRAVENOUS
  Filled 2023-07-30: qty 25

## 2023-07-30 MED ORDER — SODIUM CHLORIDE 0.9 % IV SOLN
400.0000 mg/m2 | Freq: Once | INTRAVENOUS | Status: AC
  Administered 2023-07-30: 776 mg via INTRAVENOUS
  Filled 2023-07-30: qty 38.8

## 2023-07-30 MED ORDER — PALONOSETRON HCL INJECTION 0.25 MG/5ML
0.2500 mg | Freq: Once | INTRAVENOUS | Status: AC
  Administered 2023-07-30: 0.25 mg via INTRAVENOUS
  Filled 2023-07-30: qty 5

## 2023-07-30 NOTE — Progress Notes (Signed)
Patient seen by Santiago Glad, NP today  Vitals are within treatment parameters:Yes   Labs are within treatment parameters: No (Please specify and give further instructions.)  Platelets: 421, AST: 13, Potassium: 2.8 Okay to proceed Treatment plan has been signed: Yes   Per physician team, Patient is ready for treatment and there are NO modifications to the treatment plan.   Per provider infuse IV Potassium 20 mEq if time allots, orders placed.

## 2023-07-30 NOTE — Patient Instructions (Signed)
[facility]   Discharge Instructions: The chemotherapy medication bag should finish at 46 hours, 96 hours, or 7 days. For example, if your pump is scheduled for 46 hours and it was put on at 4:00 p.m., it should finish at 2:00 p.m. the day it is scheduled to come off regardless of your appointment time.     Estimated time to finish at 1:00 FRIDAY, August 01, 2023.   If the display on your pump reads "Low Volume" and it is beeping, take the batteries out of the pump and come to the cancer center for it to be taken off.   If the pump alarms go off prior to the pump reading "Low Volume" then call [phone] and someone can assist you.  If the plunger comes out and the chemotherapy medication is leaking out, please use your home chemo spill kit to clean up the spill. Do NOT use paper towels or other household products.  If you have problems or questions regarding your pump, please call either [phone] (24 hours a day) or the cancer center Monday-Friday 8:00 a.m.- 4:30 p.m. at the clinic number and we will assist you. If you are unable to get assistance, then go to the nearest Emergency Department and ask the staff to contact the IV team for assistance.   Thank you for choosing [facility] to provide your oncology and hematology care.   If you have a lab appointment with the Cancer Center, please go directly to the Cancer Center and check in at the registration area.   Wear comfortable clothing and clothing appropriate for easy access to any Portacath or PICC line.   We strive to give you quality time with your provider. You may need to reschedule your appointment if you arrive late (15 or more minutes).  Arriving late affects you and other patients whose appointments are after yours.  Also, if you miss three or more appointments without notifying the office, you may be dismissed from the clinic at the provider's  discretion.      For prescription refill requests, have your pharmacy contact our office and allow 72 hours for refills to be completed.    Today you received the following chemotherapy and/or immunotherapy agents IRINOTECAN LIPOSOME/LEUCOVORIN/FLUOROURACIL      To help prevent nausea and vomiting after your treatment, we encourage you to take your nausea medication as directed.  BELOW ARE SYMPTOMS THAT SHOULD BE REPORTED IMMEDIATELY: *FEVER GREATER THAN 100.4 F (38 C) OR HIGHER *CHILLS OR SWEATING *NAUSEA AND VOMITING THAT IS NOT CONTROLLED WITH YOUR NAUSEA MEDICATION *UNUSUAL SHORTNESS OF BREATH *UNUSUAL BRUISING OR BLEEDING *URINARY PROBLEMS (pain or burning when urinating, or frequent urination) *BOWEL PROBLEMS (unusual diarrhea, constipation, pain near the anus) TENDERNESS IN MOUTH AND THROAT WITH OR WITHOUT PRESENCE OF ULCERS (sore throat, sores in mouth, or a toothache) UNUSUAL RASH, SWELLING OR PAIN  UNUSUAL VAGINAL DISCHARGE OR ITCHING   Items with * indicate a potential emergency and should be followed up as soon as possible or go to the Emergency Department if any problems should occur.  Please show the CHEMOTHERAPY ALERT CARD or IMMUNOTHERAPY ALERT CARD at check-in to the Emergency Department and triage nurse.  Should you have questions after your visit or need to cancel or reschedule your appointment, please contact [facility]  Dept: [phone]  and follow the prompts.  Office  hours are 8:00 a.m. to 4:30 p.m. Monday - Friday. Please note that voicemails left after 4:00 p.m. may not be returned until the following business day.  We are closed weekends and major holidays. You have access to a nurse at all times for urgent questions. Please call the main number to the clinic Dept: [phone] and follow the prompts.   For any non-urgent questions, you may also contact your provider using MyChart. We now offer e-Visits  for anyone 73 and older to request care online for non-urgent symptoms. For details visit [email or web address].   Also download the MyChart app! Go to the app store, search "MyChart", open the app, select [place], and log in with your MyChart username and password.  Irinotecan Liposome Injection What is this medication? IRINOTECAN LIPOSOME (eye ri noe TEE kan LIP oh som) treats pancreatic cancer. It works by slowing down the growth of cancer cells. This medicine may be used for other purposes; ask your health care provider or pharmacist if you have questions. COMMON BRAND NAME(S): ONIVYDE What should I tell my care team before I take this medication? They need to know if you have any of these conditions: Blockage in your bowels Dehydration Infection Low white blood cell levels Lung disease An unusual or allergic reaction to irinotecan liposome, irinotecan, other medications, foods, dyes, or preservatives Pregnant or trying to get pregnant Breast-feeding How should I use this medication? This medication is injected into a vein. It is given by your care team in a hospital or clinic setting. Talk to your care team about the use of this medication in children. Special care may be needed. Overdosage: If you think you have taken too much of this medicine contact a poison control center or emergency room at once. NOTE: This medicine is only for you. Do not share this medicine with others. What if I miss a dose? Keep appointments for follow-up doses. It is important not to miss your dose. Call your care team if you are unable to keep an appointment. What may interact with this medication? Do not take this medication with any of the following: Itraconazole This medication may also interact with the following: Certain antivirals for HIV or AIDS Certain medications for seizures, such as carbamazepine, fosphenytoin, phenytoin,  phenobarbital Clarithromycin Gemfibrozil Nefazodone Rifabutin Rifampin Rifapentine St. John's Wort Voriconazole This list may not describe all possible interactions. Give your health care provider a list of all the medicines, herbs, non-prescription drugs, or dietary supplements you use. Also tell them if you smoke, drink alcohol, or use illegal drugs. Some items may interact with your medicine. What should I watch for while using this medication? This medication may make you feel generally unwell. This is not uncommon as chemotherapy can affect healthy cells as well as cancer cells. Report any side effects. Continue your course of treatment even though you feel ill unless your care team tells you to stop. You may need blood work while you are taking this medication. This medication can cause serious side effects and allergic reactions. To reduce your risk, your care team may give you other medications to take before receiving this one. Be sure to follow the directions from your care team. Check with your care team if you get an attack of diarrhea, nausea and vomiting, or if you sweat a lot. The loss of too much body fluid can make it dangerous for you to take this medication. This medication may cause infertility. Talk to your care team if you are  concerned about your fertility. Talk to your care team if you wish to become pregnant or if you think you might be pregnant. This medication can cause serious birth defects if taken during pregnancy or if you get pregnant within 7 months after stopping therapy. A negative pregnancy test is required before starting this medication. A reliable form of contraception is recommended while taking this medication and for 7 months after stopping it. Talk to your care team about reliable forms of contraception. Use a condom during sex and for 4 months after stopping therapy. Tell your care team right away if you think your partner might be pregnant. This  medication can cause serious birth defects. Do not breast-feed while taking this medication and for 1 month after stopping therapy. This medication may increase your risk of getting an infection. Call your care team for advice if you get a fever, chills, sore throat, or other symptoms of a cold or flu. Do not treat yourself. Try to avoid being around people who are sick. Avoid taking medications that contain aspirin, acetaminophen, ibuprofen, naproxen, or ketoprofen unless instructed by your care team. These medications may hide a fever. Be careful brushing or flossing your teeth or using a toothpick because you may get an infection or bleed more easily. If you have any dental work done, tell your dentist you are receiving this medication. What side effects may I notice from receiving this medication? Side effects that you should report to your care team as soon as possible: Allergic reactions or angioedema--skin rash, itching or hives, swelling of the face, eyes, lips, tongue, arms, or legs, trouble swallowing or breathing Dry cough, shortness of breath or trouble breathing Diarrhea Infection--fever, chills, cough, or sore throat Side effects that usually do not require medical attention (report to your care team if they continue or are bothersome): Fatigue Loss of appetite Nausea Pain, redness, or swelling with sores inside the mouth or throat Vomiting This list may not describe all possible side effects. Call your doctor for medical advice about side effects. You may report side effects to FDA at 1-800-FDA-1088. Where should I keep my medication? This medication is given in a hospital or clinic. It will not be stored at home. NOTE: This sheet is a summary. It may not cover all possible information. If you have questions about this medicine, talk to your doctor, pharmacist, or health care provider.  2024 Elsevier/Gold Standard (2021-07-26 00:00:00)  Leucovorin Injection What is this  medication? LEUCOVORIN (loo koe VOR in) prevents side effects from certain medications, such as methotrexate. It works by increasing folate levels. This helps protect healthy cells in your body. It may also be used to treat anemia caused by low levels of folate. It can also be used with fluorouracil, a type of chemotherapy, to treat colorectal cancer. It works by increasing the effects of fluorouracil in the body. This medicine may be used for other purposes; ask your health care provider or pharmacist if you have questions. What should I tell my care team before I take this medication? They need to know if you have any of these conditions: Anemia from low levels of vitamin B12 in the blood An unusual or allergic reaction to leucovorin, folic acid, other medications, foods, dyes, or preservatives Pregnant or trying to get pregnant Breastfeeding How should I use this medication? This medication is injected into a vein or a muscle. It is given by your care team in a hospital or clinic setting. Talk to your care  team about the use of this medication in children. Special care may be needed. Overdosage: If you think you have taken too much of this medicine contact a poison control center or emergency room at once. NOTE: This medicine is only for you. Do not share this medicine with others. What if I miss a dose? Keep appointments for follow-up doses. It is important not to miss your dose. Call your care team if you are unable to keep an appointment. What may interact with this medication? Capecitabine Fluorouracil Phenobarbital Phenytoin Primidone Trimethoprim;sulfamethoxazole This list may not describe all possible interactions. Give your health care provider a list of all the medicines, herbs, non-prescription drugs, or dietary supplements you use. Also tell them if you smoke, drink alcohol, or use illegal drugs. Some items may interact with your medicine. What should I watch for while using  this medication? Your condition will be monitored carefully while you are receiving this medication. This medication may increase the side effects of 5-fluorouracil. Tell your care team if you have diarrhea or mouth sores that do not get better or that get worse. What side effects may I notice from receiving this medication? Side effects that you should report to your care team as soon as possible: Allergic reactions--skin rash, itching, hives, swelling of the face, lips, tongue, or throat This list may not describe all possible side effects. Call your doctor for medical advice about side effects. You may report side effects to FDA at 1-800-FDA-1088. Where should I keep my medication? This medication is given in a hospital or clinic. It will not be stored at home. NOTE: This sheet is a summary. It may not cover all possible information. If you have questions about this medicine, talk to your doctor, pharmacist, or health care provider.  2024 Elsevier/Gold Standard (2021-10-30 00:00:00)  Fluorouracil Injection What is this medication? FLUOROURACIL (flure oh YOOR a sil) treats some types of cancer. It works by slowing down the growth of cancer cells. This medicine may be used for other purposes; ask your health care provider or pharmacist if you have questions. COMMON BRAND NAME(S): Adrucil What should I tell my care team before I take this medication? They need to know if you have any of these conditions: Blood disorders Dihydropyrimidine dehydrogenase (DPD) deficiency Infection, such as chickenpox, cold sores, herpes Kidney disease Liver disease Poor nutrition Recent or ongoing radiation therapy An unusual or allergic reaction to fluorouracil, other medications, foods, dyes, or preservatives If you or your partner are pregnant or trying to get pregnant Breast-feeding How should I use this medication? This medication is injected into a vein. It is administered by your care team in a  hospital or clinic setting. Talk to your care team about the use of this medication in children. Special care may be needed. Overdosage: If you think you have taken too much of this medicine contact a poison control center or emergency room at once. NOTE: This medicine is only for you. Do not share this medicine with others. What if I miss a dose? Keep appointments for follow-up doses. It is important not to miss your dose. Call your care team if you are unable to keep an appointment. What may interact with this medication? Do not take this medication with any of the following: Live virus vaccines This medication may also interact with the following: Medications that treat or prevent blood clots, such as warfarin, enoxaparin, dalteparin This list may not describe all possible interactions. Give your health care provider a  list of all the medicines, herbs, non-prescription drugs, or dietary supplements you use. Also tell them if you smoke, drink alcohol, or use illegal drugs. Some items may interact with your medicine. What should I watch for while using this medication? Your condition will be monitored carefully while you are receiving this medication. This medication may make you feel generally unwell. This is not uncommon as chemotherapy can affect healthy cells as well as cancer cells. Report any side effects. Continue your course of treatment even though you feel ill unless your care team tells you to stop. In some cases, you may be given additional medications to help with side effects. Follow all directions for their use. This medication may increase your risk of getting an infection. Call your care team for advice if you get a fever, chills, sore throat, or other symptoms of a cold or flu. Do not treat yourself. Try to avoid being around people who are sick. This medication may increase your risk to bruise or bleed. Call your care team if you notice any unusual bleeding. Be careful brushing  or flossing your teeth or using a toothpick because you may get an infection or bleed more easily. If you have any dental work done, tell your dentist you are receiving this medication. Avoid taking medications that contain aspirin, acetaminophen, ibuprofen, naproxen, or ketoprofen unless instructed by your care team. These medications may hide a fever. Do not treat diarrhea with over the counter products. Contact your care team if you have diarrhea that lasts more than 2 days or if it is severe and watery. This medication can make you more sensitive to the sun. Keep out of the sun. If you cannot avoid being in the sun, wear protective clothing and sunscreen. Do not use sun lamps, tanning beds, or tanning booths. Talk to your care team if you or your partner wish to become pregnant or think you might be pregnant. This medication can cause serious birth defects if taken during pregnancy and for 3 months after the last dose. A reliable form of contraception is recommended while taking this medication and for 3 months after the last dose. Talk to your care team about effective forms of contraception. Do not father a child while taking this medication and for 3 months after the last dose. Use a condom while having sex during this time period. Do not breastfeed while taking this medication. This medication may cause infertility. Talk to your care team if you are concerned about your fertility. What side effects may I notice from receiving this medication? Side effects that you should report to your care team as soon as possible: Allergic reactions--skin rash, itching, hives, swelling of the face, lips, tongue, or throat Heart attack--pain or tightness in the chest, shoulders, arms, or jaw, nausea, shortness of breath, cold or clammy skin, feeling faint or lightheaded Heart failure--shortness of breath, swelling of the ankles, feet, or hands, sudden weight gain, unusual weakness or fatigue Heart rhythm  changes--fast or irregular heartbeat, dizziness, feeling faint or lightheaded, chest pain, trouble breathing High ammonia level--unusual weakness or fatigue, confusion, loss of appetite, nausea, vomiting, seizures Infection--fever, chills, cough, sore throat, wounds that don't heal, pain or trouble when passing urine, general feeling of discomfort or being unwell Low red blood cell level--unusual weakness or fatigue, dizziness, headache, trouble breathing Pain, tingling, or numbness in the hands or feet, muscle weakness, change in vision, confusion or trouble speaking, loss of balance or coordination, trouble walking, seizures Redness, swelling, and  blistering of the skin over hands and feet Severe or prolonged diarrhea Unusual bruising or bleeding Side effects that usually do not require medical attention (report to your care team if they continue or are bothersome): Dry skin Headache Increased tears Nausea Pain, redness, or swelling with sores inside the mouth or throat Sensitivity to light Vomiting This list may not describe all possible side effects. Call your doctor for medical advice about side effects. You may report side effects to FDA at 1-800-FDA-1088. Where should I keep my medication? This medication is given in a hospital or clinic. It will not be stored at home. NOTE: This sheet is a summary. It may not cover all possible information. If you have questions about this medicine, talk to your doctor, pharmacist, or health care provider.  2024 Elsevier/Gold Standard (2021-10-02 00:00:00)

## 2023-07-31 LAB — CANCER ANTIGEN 19-9: CA 19-9: 485 U/mL — ABNORMAL HIGH (ref 0–35)

## 2023-08-01 ENCOUNTER — Inpatient Hospital Stay: Payer: BC Managed Care – PPO

## 2023-08-01 ENCOUNTER — Other Ambulatory Visit: Payer: Self-pay

## 2023-08-01 ENCOUNTER — Ambulatory Visit (HOSPITAL_COMMUNITY)
Admission: RE | Admit: 2023-08-01 | Discharge: 2023-08-01 | Disposition: A | Discharge status: Home / Self Care | Payer: BC Managed Care – PPO | Source: Ambulatory Visit | Attending: Nurse Practitioner | Admitting: Nurse Practitioner

## 2023-08-01 VITALS — BP 112/71 | HR 76 | Temp 99.7°F | Resp 17

## 2023-08-01 DIAGNOSIS — D649 Anemia, unspecified: Secondary | ICD-10-CM

## 2023-08-01 DIAGNOSIS — T82598A Other mechanical complication of other cardiac and vascular devices and implants, initial encounter: Secondary | ICD-10-CM | POA: Diagnosis not present

## 2023-08-01 DIAGNOSIS — C250 Malignant neoplasm of head of pancreas: Secondary | ICD-10-CM | POA: Insufficient documentation

## 2023-08-01 DIAGNOSIS — Z5111 Encounter for antineoplastic chemotherapy: Secondary | ICD-10-CM | POA: Diagnosis not present

## 2023-08-01 DIAGNOSIS — C7802 Secondary malignant neoplasm of left lung: Secondary | ICD-10-CM | POA: Diagnosis not present

## 2023-08-01 DIAGNOSIS — E876 Hypokalemia: Secondary | ICD-10-CM

## 2023-08-01 DIAGNOSIS — Z452 Encounter for adjustment and management of vascular access device: Secondary | ICD-10-CM | POA: Insufficient documentation

## 2023-08-01 DIAGNOSIS — Z79899 Other long term (current) drug therapy: Secondary | ICD-10-CM | POA: Diagnosis not present

## 2023-08-01 DIAGNOSIS — C7801 Secondary malignant neoplasm of right lung: Secondary | ICD-10-CM | POA: Diagnosis not present

## 2023-08-01 HISTORY — PX: IR CV LINE INJECTION: IMG2294

## 2023-08-01 LAB — BASIC METABOLIC PANEL - CANCER CENTER ONLY
Anion gap: 5 (ref 5–15)
BUN: 11 mg/dL (ref 6–20)
CO2: 29 mmol/L (ref 22–32)
Calcium: 8.5 mg/dL — ABNORMAL LOW (ref 8.9–10.3)
Chloride: 107 mmol/L (ref 98–111)
Creatinine: 0.85 mg/dL (ref 0.44–1.00)
GFR, Estimated: >60 mL/min (ref >60)
Glucose, Bld: 160 mg/dL — ABNORMAL HIGH (ref 70–99)
Potassium: 3.9 mmol/L (ref 3.5–5.1)
Sodium: 141 mmol/L (ref 135–145)

## 2023-08-01 MED ORDER — HEPARIN SOD (PORK) LOCK FLUSH 100 UNIT/ML IV SOLN
INTRAVENOUS | Status: AC
  Filled 2023-08-01: qty 5

## 2023-08-01 MED ORDER — HEPARIN SOD (PORK) LOCK FLUSH 100 UNIT/ML IV SOLN
500.0000 [IU] | Freq: Once | INTRAVENOUS | Status: AC
  Administered 2023-08-01: 500 [IU] via INTRAVENOUS

## 2023-08-01 MED ORDER — POTASSIUM CHLORIDE 10 MEQ/100ML IV SOLN
10.0000 meq | INTRAVENOUS | Status: AC
  Administered 2023-08-01 (×2): 10 meq via INTRAVENOUS
  Filled 2023-08-01 (×2): qty 100

## 2023-08-01 MED ORDER — IOHEXOL 300 MG/ML  SOLN
50.0000 mL | Freq: Once | INTRAMUSCULAR | Status: AC | PRN
  Administered 2023-08-01: 15 mL

## 2023-08-01 MED ORDER — HEPARIN SOD (PORK) LOCK FLUSH 100 UNIT/ML IV SOLN: 500.0000 [IU] | Freq: Once | INTRAVENOUS | Status: DC | PRN

## 2023-08-01 MED ORDER — SODIUM CHLORIDE 0.9 % IV SOLN: INTRAVENOUS | Status: DC

## 2023-08-01 MED ORDER — SODIUM CHLORIDE 0.9% FLUSH: 10.0000 mL | INTRAVENOUS | Status: DC | PRN

## 2023-08-01 NOTE — Progress Notes (Signed)
Pt came in today for potassium. Pt received the first bag of potassium. The second bag went up started beeping. Fixed the line and and proceeded to continue with administering the potassium, line was disconnected tried flushing the port but was met with resistance. Informed charge [place] O who came over to help trouble shoot. Layed Pt back and discovered a bulge in her chest.Informed Lonna Cobb NP who examined the area and informed us to send Pt to [facility] to IR for dye study. Port was left accessed  per IR request.

## 2023-08-01 NOTE — Patient Instructions (Signed)
Potassium Chloride Injection What is this medication? POTASSIUM CHLORIDE (poe TASS i um KLOOR ide) prevents and treats low levels of potassium in your body. Potassium plays an important role in maintaining the health of your kidneys, heart, muscles, and nervous system. This medicine may be used for other purposes; ask your health care provider or pharmacist if you have questions. COMMON BRAND NAME(S): PROAMP What should I tell my care team before I take this medication? They need to know if you have any of these conditions: Addison disease Dehydration Diabetes (high blood sugar) Heart disease High levels of potassium in the blood Irregular heartbeat or rhythm Kidney disease Large areas of burned skin An unusual or allergic reaction to potassium, other medications, foods, dyes, or preservatives Pregnant or trying to get pregnant Breast-feeding How should I use this medication? This medication is injected into a vein. It is given in a hospital or clinic setting. Talk to your care team about the use of this medication in children. Special care may be needed. Overdosage: If you think you have taken too much of this medicine contact a poison control center or emergency room at once. NOTE: This medicine is only for you. Do not share this medicine with others. What if I miss a dose? This does not apply. This medication is not for regular use. What may interact with this medication? Do not take this medication with any of the following: Certain diuretics, such as spironolactone, triamterene Eplerenone Sodium polystyrene sulfonate This medication may also interact with the following: Certain medications for blood pressure or heart disease, such as lisinopril, losartan, quinapril, valsartan Medications that lower your chance of fighting infection, such as cyclosporine, tacrolimus NSAIDs, medications for pain and inflammation, such as ibuprofen or naproxen Other potassium supplements Salt  substitutes This list may not describe all possible interactions. Give your health care provider a list of all the medicines, herbs, non-prescription drugs, or dietary supplements you use. Also tell them if you smoke, drink alcohol, or use illegal drugs. Some items may interact with your medicine. What should I watch for while using this medication? Visit your care team for regular checks on your progress. Tell your care team if your symptoms do not start to get better or if they get worse. You may need blood work while you are taking this medication. Avoid salt substitutes unless you are told otherwise by your care team. What side effects may I notice from receiving this medication? Side effects that you should report to your care team as soon as possible: Allergic reactions--skin rash, itching, hives, swelling of the face, lips, tongue, or throat High potassium level--muscle weakness, fast or irregular heartbeat Side effects that usually do not require medical attention (report to your care team if they continue or are bothersome): Diarrhea Nausea Stomach pain Vomiting This list may not describe all possible side effects. Call your doctor for medical advice about side effects. You may report side effects to FDA at 1-800-FDA-1088. Where should I keep my medication? This medication is given in a hospital or clinic. It will not be stored at home. NOTE: This sheet is a summary. It may not cover all possible information. If you have questions about this medicine, talk to your doctor, pharmacist, or health care provider.  Hypokalemia Hypokalemia means that the amount of potassium in the blood is lower than normal. Potassium is a mineral (electrolyte) that helps regulate the amount of fluid in the body. It also stimulates muscle tightening (contraction) and helps nerves work  properly. Normally, most of the body's potassium is inside cells, and only a very small amount is in the blood. Because the  amount in the blood is so small, minor changes to potassium levels in the blood can be life-threatening. What are the causes? This condition may be caused by: Antibiotic medicine. Diarrhea or vomiting. Taking too much of a medicine that helps you have a bowel movement (laxative) can cause diarrhea and lead to hypokalemia. Chronic kidney disease (CKD). Medicines that help the body get rid of excess fluid (diuretics). Eating disorders, such as anorexia or bulimia. Low magnesium levels in the body. Sweating a lot. What are the signs or symptoms? Symptoms of this condition include: Weakness. Constipation. Fatigue. Muscle cramps. Mental confusion. Skipped heartbeats or irregular heartbeat (palpitations). Tingling or numbness. How is this diagnosed? This condition is diagnosed with a blood test. How is this treated? This condition may be treated by: Taking potassium supplements. Adjusting the medicines that you take. Eating more foods that contain a lot of potassium. If your potassium level is very low, you may need to get potassium through an IV and be monitored in the hospital. Follow these instructions at home: Eating and drinking  Eat a healthy diet. A healthy diet includes fresh fruits and vegetables, whole grains, healthy fats, and lean proteins. If told, eat more foods that contain a lot of potassium. These include: Nuts, such as peanuts and pistachios. Seeds, such as sunflower seeds and pumpkin seeds. Peas, lentils, and lima beans. Whole grain and bran cereals and breads. Fresh fruits and vegetables, such as apricots, avocado, bananas, cantaloupe, kiwi, oranges, tomatoes, asparagus, and potatoes. Juices, such as orange, tomato, and prune. Lean meats, including fish. Milk and milk products, such as yogurt. General instructions Take over-the-counter and prescription medicines only as told by your health care provider. This includes vitamins, natural food products, and  supplements. Keep all follow-up visits. This is important. Contact a health care provider if: You have weakness that gets worse. You feel your heart pounding or racing. You vomit. You have diarrhea. You have diabetes and you have trouble keeping your blood sugar in your target range. Get help right away if: You have chest pain. You have shortness of breath. You have vomiting or diarrhea that lasts for more than 2 days. You faint. These symptoms may be an emergency. Get help right away. Call 911. Do not wait to see if the symptoms will go away. Do not drive yourself to the hospital. Summary Hypokalemia means that the amount of potassium in the blood is lower than normal. This condition is diagnosed with a blood test. Hypokalemia may be treated by taking potassium supplements, adjusting the medicines that you take, or eating more foods that are high in potassium. If your potassium level is very low, you may need to get potassium through an IV and be monitored in the hospital. This information is not intended to replace advice given to you by your health care provider. Make sure you discuss any questions you have with your health care provider. Document Revised: 02/08/2021 Document Reviewed: 02/08/2021 Elsevier Patient Education  2024 Elsevier Inc.  2024 Elsevier/Gold Standard (2021-12-07 00:00:00)

## 2023-08-04 ENCOUNTER — Other Ambulatory Visit (HOSPITAL_COMMUNITY): Payer: Self-pay | Admitting: Interventional Radiology

## 2023-08-04 ENCOUNTER — Telehealth: Payer: Self-pay

## 2023-08-04 ENCOUNTER — Inpatient Hospital Stay: Payer: BC Managed Care – PPO | Admitting: Nurse Practitioner

## 2023-08-04 DIAGNOSIS — C259 Malignant neoplasm of pancreas, unspecified: Secondary | ICD-10-CM

## 2023-08-04 NOTE — Progress Notes (Unsigned)
 Palliative Medicine [facility]  [phone] [phone]) [phone]   Name: Alyssa Ross Date: 08/04/2023 MRN: [id number]  DOB: [birth year]  Patient Care Team: Dorothyann Peng, MD as PCP - General (Internal Medicine) Lexine Baton, Dawn, RN (Inactive) as Oncology Nurse Navigator Pickenpack-Cousar, Arty Baumgartner, NP as Nurse Practitioner [facility] and Palliative Medicine)   I connected with Alyssa Ross on 08/04/23 at 11:30 AM EST by phone and verified that I am speaking with the correct person using two identifiers.   I discussed the limitations, risks, security and privacy concerns of performing an evaluation and management service by telemedicine and the availability of in-person appointments. I also discussed with the patient that there may be a patient responsible charge related to this service. The patient expressed understanding and agreed to proceed.   Other persons participating in the visit and their role in the encounter: n/a   Patient's location: home  Provider's location: [facility]   Chief Complaint: f/u of symptom management    INTERVAL HISTORY: Alyssa Ross is a 58 y.o. female with oncologic medical history including pancreatic cancer (01/2019). The patient is 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 and is currently receiving FOLFIRI with liposomal irinotecan. Palliative ask to see for symptom management and goals of care.   SOCIAL HISTORY:     reports that she has never smoked. She has never used smokeless tobacco. She reports that she does not currently use alcohol. She reports that she does not use drugs.  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  MEDICATIONS:  Current Outpatient Medications  Medication Sig Dispense Refill   apixaban (ELIQUIS) 5 MG TABS tablet Take 1 tablet (5 mg total) by mouth 2 (two) times daily. 180 tablet 0   cetirizine (ZYRTEC) 10 MG tablet Take 10 mg by mouth daily.     Continuous Blood Gluc Sensor (FREESTYLE LIBRE 2 SENSOR) MISC APPLY EVERY 14 DAYS 2 each 5   glucose blood test strip Check sugar 2 times daily. 100 each 12   Insulin Degludec (TRESIBA FLEXTOUCH [place]) Inject into the skin. 12-18 Units     insulin lispro (HUMALOG KWIKPEN) 200 UNIT/ML KwikPen 6 units with breakfast, 13 units with lunch, 16 units with dinner     Insulin Pen Needle (BD PEN NEEDLE NANO U/F) 32G X 4 MM MISC USE TO INJECT INSULIN THREE TIMES A DAY 270 each 3   lidocaine-prilocaine (EMLA) cream Apply 1 Application topically as directed. Apply 1 hour prior to stick and cover with plastic wrap (Patient not taking: Reported on 07/30/2023) 30 g 5   magic mouthwash (nystatin, diphenhydrAMINE, alum & mag hydroxide) suspension mixture Take  5 mLs by mouth 4 (four) times daily as needed for mouth pain. (Patient not taking: Reported on 07/30/2023) 240 mL 1   magnesium oxide (MAG-OX) 400 (240 Mg) MG tablet TAKE 1 TABLET(400 MG) BY MOUTH TWICE DAILY 180 tablet 1   ondansetron (ZOFRAN) 8 MG tablet Take 1 tablet (8 mg total) by mouth every 8 (eight) hours as needed for nausea or vomiting (do not begin until 72 hours after chemo). (Patient not taking: Reported on 07/30/2023) 20 tablet 3   potassium  chloride SA (KLOR-CON M) 20 MEQ tablet Take 1 tablet (20 mEq total) by mouth 3 (three) times daily. 90 tablet 1   prochlorperazine (COMPAZINE) 10 MG tablet TAKE 1 TABLET(10 MG) BY MOUTH EVERY 6 HOURS AS NEEDED FOR NAUSEA OR VOMITING (Patient not taking: Reported on 06/05/2023) 60 tablet 2   rosuvastatin (CRESTOR) 10 MG tablet TAKE 1 TABLET AT BEDTIME 90 tablet 3   spironolactone (ALDACTONE) 25 MG tablet TAKE 1 TABLET(25 MG) BY MOUTH DAILY (Patient not taking: Reported on 07/30/2023) 90 tablet 2   valsartan (DIOVAN) 160 MG tablet Take 1 tablet (160 mg total) by mouth daily. (Patient not taking: Reported on 07/30/2023) 90 tablet 3   No current facility-administered medications for this visit.   Facility-Administered Medications Ordered in Other Visits  Medication Dose Route Frequency Provider Last Rate Last Admin   sodium chloride flush (NS) 0.9 % injection 10 mL  10 mL Intravenous PRN Rana Snare, NP   10 mL at 10/24/22 1311    VITAL SIGNS: LMP 09/17/2012  There were no vitals filed for this visit.  Estimated body mass index is 26.65 kg/m as calculated from the following:   Height as of 07/30/23: 5\' 8"  (1.727 m).   Weight as of 07/30/23: 175 lb 4.8 oz (79.5 kg).   PERFORMANCE STATUS (ECOG) : 1 - Symptomatic but completely ambulatory   Physical Exam General: NAD Cardiovascular: regular rate and rhythm Pulmonary: clear ant fields Abdomen: soft, nontender, + bowel sounds Extremities: no edema, no joint deformities Skin: no rashes Neurological:   IMPRESSION:   We discussed Her current illness and what it means in the larger context of Her on-going co-morbidities. Natural disease trajectory and expectations were discussed.  I discussed the importance of continued conversation with family and their medical providers regarding overall plan of care and treatment options, ensuring decisions are within the context of the patients values and GOCs.  PLAN: Pancreatic Cancer Patient has  been battling this for over four years. Currently experiencing issues with nutrition and hydration due to lack of appetite and gastrointestinal symptoms. No pain reported. -Continue current treatment plan per Oncology.  -Consider B12. Will obtain baseline at next office visit prior to considering. Per discussions with Oncology decisions to not start Folic Acid given decrease effectiveness with treatment regimen.  -Consider referral to a therapist for neuropathy management if symptoms worsen. -Patient has been actively involved with Dietician for appetite and nutrition management.  Chemotherapy-Induced Diarrhea Patient experiences diarrhea post-treatment, managed with Imodium. However, this leads to constipation and discomfort. -Atropine during treatment to potentially reduce severity of diarrhea per Oncology.  -Continue use of Imodium as needed.  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 -Consider B12 and folic acid supplementation to potentially help with fatigue related to anemia, pending discussions with Oncology team.   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 -Consider referral to a therapist for neuropathy management if symptoms worsen. -Consider Alpha Lipoic Acid  supplementation, pending discussion with oncologist. Advised per pharmacy comparison could potentially decrease effectiveness of Camptosar.   Goals of Care/End of Life Care Planning Patient has been informed about the options for palliative care and hospice, and the process of transitioning between the two. -Continue to provide information and support as needed. -Consider initiating advanced directives and healthcare power of attorney at advanced directive clinic. -MOST form introduced, and education provided. Will complete at upcoming appointments at patient's convenience. -Extensive education provided on palliative and  hospice's philosophy of care.    Follow-up in six weeks. Patient knows to contact office sooner if needed.   Patient expressed understanding and was in agreement with this plan. She also understands that She can call the clinic at any time with any questions, concerns, or complaints.   Any controlled substances utilized were prescribed in the context of palliative care. PDMP has been reviewed.    Visit consisted of counseling and education dealing with the complex and emotionally intense issues of symptom management and palliative care in the setting of serious and potentially life-threatening illness.  Willette Alma, AGPCNP-BC  Palliative Medicine Team/[facility]

## 2023-08-04 NOTE — Telephone Encounter (Signed)
-----   Message from Lonna Cobb sent at 08/01/2023  4:24 PM EST ----- Please let her know the potassium level is normal.  She can continue potassium 3 times daily.  Follow-up as scheduled.

## 2023-08-04 NOTE — Telephone Encounter (Signed)
 Patient gave verbal understanding had no further questions or concerns at time.

## 2023-08-06 ENCOUNTER — Encounter: Payer: Self-pay | Admitting: Oncology

## 2023-08-07 ENCOUNTER — Other Ambulatory Visit: Payer: Self-pay | Admitting: Radiology

## 2023-08-07 NOTE — H&P (Signed)
 Chief Complaint: pancreatic carcinoma s/p prior surgically placed left subclavian approach port a catheter which is now poorly functioning and kinked; referred for left subclavian approach port a catheter removal; image guided new port a catheter placement   Referring Provider(s): Santiago Glad   Supervising Physician: Gilmer Mor  Patient Status: [facility] - Out-pt  History of Present Illness: Alyssa Ross is a 58 y.o. female with a history of type 2 diabetes, hyperlipidemia, hypertension, fibroid uterus, and neuroendocrine tumor of the pancreas.  Pt is known to [place] from recent image guided liver lesion biopsy 01/27/23 with Dr. Fredia Sorrow. Pt is currently followed by Santiago Glad with oncology for pancreatic carcinoma. She was initially diagnosed with pancreatic carcinoma by FNA biopsy on 1/20/202. She is s/p pancreaticoduodenectomy 02/12/19, s/p 8 rounds of FOLFIRONOX (final 08/03/19), s/p 48 rounds gemcitabine/abraxane (final 03/27/23) and currently undergoing chemotherapy treatment s/p 5 rounds FOLFIRI (most recent 07/30/23).  She is currently with a left subclavian approach port a catheter placed by Dr. Donell Beers on 06/20/18 which she has undergone all above chemotherapy treatments with. The port a cath is now poorly functioning and recent port injection on 08/01/23 revealed:  Malfunctioning LEFT subclavian-approach port catheter with looping and kinking.   No overt catheter fracture or contrast extravasation.  She was referred to interventional radiology for left subclavian approach port a catheter removal and new image guided port a catheter placement.     Patient is Full Code  Past Medical History:  Diagnosis Date   Anemia    ASCUS (atypical squamous cells of undetermined significance) on Pap smear 08/06/1999   Breast mass in female 2002   Left   Diabetes mellitus without complication (HCC)    type 2   Family history of pancreatic cancer    Fibroid uterus 2010   HLD (hyperlipidemia)     Hypertension    Irregular bleeding 2011   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   Lung nodule    pancreatic ca dx'd 11/2018   Neuroendocrine Tumor of the pancreas   PONV (postoperative nausea and vomiting)    nausea  vomitting after 12/25/18 ERCP   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   Yeast vaginitis 2006    Past Surgical History:  Procedure Laterality Date   BILIARY STENT PLACEMENT N/A 12/25/2018   Procedure: BILIARY STENT PLACEMENT;  Surgeon: Jeani Hawking, MD;  Location: [facility];  Service: Endoscopy;  Laterality: N/A;   BILIARY STENT PLACEMENT N/A 01/01/2019   Procedure: BILIARY STENT PLACEMENT;  Surgeon: Jeani Hawking, MD;  Location: [facility];  Service: Endoscopy;  Laterality: N/A;   BRONCHIAL BIOPSY  01/30/2021   Procedure: BRONCHIAL BIOPSIES;  Surgeon: Josephine Igo, DO;  Location: [facility];  Service: Pulmonary;;   BRONCHIAL BRUSHINGS  01/30/2021   Procedure: BRONCHIAL BRUSHINGS;  Surgeon: Josephine Igo, DO;  Location: [facility];  Service: Pulmonary;;   BRONCHIAL NEEDLE ASPIRATION BIOPSY  01/30/2021   Procedure: BRONCHIAL NEEDLE ASPIRATION BIOPSIES;  Surgeon: Josephine Igo, DO;  Location: [facility];  Service: Pulmonary;;   BRONCHIAL WASHINGS  01/30/2021   Procedure: BRONCHIAL WASHINGS;  Surgeon: Josephine Igo, DO;  [facility]: [facility];  Service: Pulmonary;;   DILATATION & CURETTAGE/HYSTEROSCOPY WITH TRUECLEAR N/A 01/20/2014   Procedure: DILATATION & CURETTAGE/HYSTEROSCOPY WITH TRUCLEAR;  Surgeon: Michael Litter, MD;  Location: [facility];  Service: Gynecology;  Laterality: N/A;   DILATATION & CURRETTAGE/HYSTEROSCOPY WITH RESECTOCOPE N/A 01/20/2014   Procedure: DILATATION & CURETTAGE/HYSTEROSCOPY WITH RESECTOCOPE;  Surgeon: Michael Litter,  MD;  Location: [facility];  Service: Gynecology;  Laterality: N/A;   ENDOSCOPIC RETROGRADE CHOLANGIOPANCREATOGRAPHY (ERCP) WITH PROPOFOL N/A 12/25/2018   Procedure: ENDOSCOPIC  RETROGRADE CHOLANGIOPANCREATOGRAPHY (ERCP) WITH PROPOFOL;  Surgeon: Jeani Hawking, MD;  [facility]: [facility];  Service: Endoscopy;  Laterality: N/A;   ENDOSCOPIC RETROGRADE CHOLANGIOPANCREATOGRAPHY (ERCP) WITH PROPOFOL N/A 01/01/2019   Procedure: ENDOSCOPIC RETROGRADE CHOLANGIOPANCREATOGRAPHY (ERCP) WITH PROPOFOL;  Surgeon: Jeani Hawking, MD;  [facility]: [facility];  Service: Endoscopy;  Laterality: N/A;   ERCP  12/25/2018   FINE NEEDLE ASPIRATION N/A 01/01/2019   Procedure: FINE NEEDLE ASPIRATION (FNA) LINEAR;  Surgeon: Jeani Hawking, MD;  [facility];  Service: Endoscopy;  Laterality: N/A;   IR CV LINE INJECTION  08/01/2023   LAPAROSCOPY N/A 03/04/2019   Procedure: LAPAROSCOPY DIAGNOSTIC;  Surgeon: Almond Lint, MD;  Location: [facility];  Service: General;  Laterality: N/A;  GENERAL AND EPIDURAL   PARTIAL NEPHRECTOMY Right 03/04/2019   Procedure: Nephrectomy Partial;  Surgeon: Malen Gauze, MD;  Location[facility] OR;  Service: Urology;  Laterality: Right;   PORTACATH PLACEMENT Left 04/21/2019   Procedure: INSERTION PORT-A-CATH;  Surgeon: Almond Lint, MD;  Location: [facility];  Service: General;  Laterality: Left;   SPHINCTEROTOMY  12/25/2018   Procedure: Dennison Mascot;  Surgeon: Jeani Hawking, MD;  [facility];  Service: Endoscopy;;   STENT REMOVAL  01/01/2019   Procedure: STENT REMOVAL;  Surgeon: Jeani Hawking, MD;  [facility];  Service: Endoscopy;;   UPPER ESOPHAGEAL ENDOSCOPIC ULTRASOUND (EUS) N/A 01/01/2019   Procedure: UPPER ESOPHAGEAL ENDOSCOPIC ULTRASOUND (EUS);  Surgeon: Jeani Hawking, MD;  Location: [facility];  Service: Endoscopy;  Laterality: N/A;   VIDEO BRONCHOSCOPY WITH ENDOBRONCHIAL NAVIGATION Bilateral 01/30/2021   Procedure: VIDEO BRONCHOSCOPY WITH ENDOBRONCHIAL NAVIGATION;  Surgeon: Josephine Igo, DO;  [facility]: [facility];  Service: Pulmonary;  Laterality: Bilateral;  ION   VIDEO BRONCHOSCOPY WITH RADIAL ENDOBRONCHIAL ULTRASOUND   01/30/2021   Procedure: RADIAL ENDOBRONCHIAL ULTRASOUND;  Surgeon: Josephine Igo, DO;  [facility];  Service: Pulmonary;;   WHIPPLE PROCEDURE N/A 03/04/2019   Procedure: WHIPPLE PROCEDURE;  Surgeon: Almond Lint, MD;  [facility];  Service: General;  Laterality: N/A;  GENERAL AND EPIDURAL    Allergies: Cherry and Lemon oil  Medications: Prior to Admission medications   Medication Sig Start Date End Date Taking? Authorizing Provider  apixaban (ELIQUIS) 5 MG TABS tablet Take 1 tablet (5 mg total) by mouth 2 (two) times daily. 06/05/23   Rana Snare, NP  cetirizine (ZYRTEC) 10 MG tablet Take 10 mg by mouth daily.    [provider]  Continuous Blood Gluc Sensor (FREESTYLE LIBRE 2 SENSOR) MISC APPLY EVERY 14 DAYS 09/25/20   Reather Littler, MD  glucose blood test strip Check sugar 2 times daily. 03/21/20   Reather Littler, MD  Insulin Degludec (TRESIBA FLEXTOUCH [place]) Inject into the skin. 12-18 Units    [provider]  insulin lispro (HUMALOG KWIKPEN) 200 UNIT/ML KwikPen 6 units with breakfast, 13 units with lunch, 16 units with dinner 05/17/21   [provider]  Insulin Pen Needle (BD PEN NEEDLE NANO U/F) 32G X 4 MM MISC USE TO INJECT INSULIN THREE TIMES A DAY 02/19/21   Reather Littler, MD  lidocaine-prilocaine (EMLA) cream Apply 1 Application topically as directed. Apply 1 hour prior to stick and cover with plastic wrap Patient not taking: Reported on 07/30/2023 03/28/22   Rana Snare, NP  magic mouthwash (nystatin, diphenhydrAMINE, alum & mag hydroxide) suspension mixture Take 5  mLs by mouth 4 (four) times daily as needed for mouth pain. Patient not taking: Reported on 07/30/2023 06/19/23     magnesium oxide (MAG-OX) 400 (240 Mg) MG tablet TAKE 1 TABLET(400 MG) BY MOUTH TWICE DAILY 03/21/23   Ladene Artist, MD  ondansetron (ZOFRAN) 8 MG tablet Take 1 tablet (8 mg total) by mouth every 8 (eight) hours as needed for nausea or vomiting (do not begin until 72  hours after chemo). Patient not taking: Reported on 07/30/2023 05/21/23   Rana Snare, NP  potassium chloride SA (KLOR-CON M) 20 MEQ tablet Take 1 tablet (20 mEq total) by mouth 3 (three) times daily. 07/21/23   Rana Snare, NP  prochlorperazine (COMPAZINE) 10 MG tablet TAKE 1 TABLET(10 MG) BY MOUTH EVERY 6 HOURS AS NEEDED FOR NAUSEA OR VOMITING Patient not taking: Reported on 06/05/2023 05/21/23   Rana Snare, NP  rosuvastatin (CRESTOR) 10 MG tablet TAKE 1 TABLET AT BEDTIME 12/02/22   Dorothyann Peng, MD  spironolactone (ALDACTONE) 25 MG tablet TAKE 1 TABLET(25 MG) BY MOUTH DAILY Patient not taking: Reported on 07/30/2023 05/14/23   Dorothyann Peng, MD  valsartan (DIOVAN) 160 MG tablet Take 1 tablet (160 mg total) by mouth daily. Patient not taking: Reported on 07/30/2023 07/03/23   Dorothyann Peng, MD     Family History  Problem Relation Age of Onset   Diabetes Mother    Dementia Mother    Hyperlipidemia Mother    Hypertension Mother    Irregular heart beat Mother    Diabetes Father    Hypertension Father    Hyperlipidemia Father    Down syndrome Sister    Diabetes Sister    Hyperlipidemia Brother    Heart Problems Brother    Goiter Maternal Aunt    Thyroid nodules Sister    Cancer Cousin 60       eye; maternal first cousin   Goiter Cousin    Cancer Paternal Aunt        unknown form of cancer   Cancer Cousin        unknown form of cancer; paternal first cousin   Pancreatic cancer Cousin 68       paternal first cousin   Cancer Cousin 57       unknown cancer; paternal first cousin   Cancer Cousin 21       unknown cancer; paternal first cousin    Social History   Socioeconomic History   Marital status: Single    Spouse name: Not on file   Number of children: Not on file   Years of education: Not on file   Highest education level: Not on file  Occupational History   Not on file  Tobacco Use   Smoking status: Never   Smokeless tobacco: Never  Vaping Use   Vaping  status: Never Used  Substance and Sexual Activity   Alcohol use: Not Currently   Drug use: No   Sexual activity: Yes    Birth control/protection: Post-menopausal  Other Topics Concern   Not on file  Social History Narrative   Not on file   Social Drivers of Health   Financial Resource Strain: Not on file  Food Insecurity: No Food Insecurity (04/05/2022)   Hunger Vital Sign    Worried About Running Out of Food in the Last Year: Never true    Ran Out of Food in the Last Year: Never true  Transportation Needs: No Transportation Needs (04/05/2022)   PRAPARE - Transportation  Lack of Transportation (Medical): No    Lack of Transportation (Non-Medical): No  Physical Activity: Not on file  Stress: Not on file  Social Connections: Unknown (08/27/2022)   Received from [facility]   Social Network    Social Network: Not on file       Review of Systems: currently denies fever,HA,CP,dyspnea, cough, abd/back pain,N/V or bleeding  Vital Signs: temp 98.1  BP 120/79  HR 70  R 18  O2 SATS 100 % RA  LMP 09/17/2012   Advance Care Plan: no documents on file  Physical Exam: awake; alert; chest- CTA bilat; heart- RRR; abd-soft,+BS,NT; no LE edema  Imaging: IR CV Line Injection Result Date: 08/01/2023 INDICATION: Catheter malfunctioning.  Not working Briefly 58 year old female with a history of pancreatic cancer s/p LEFT subclavian surgically-placed port catheter on 04/21/2019. EXAM: PORT A CATHETER CHECK COMPARISON:  Chest XR, 06/08/2023. MEDICATIONS: None. CONTRAST:  15 mL Omnipaque 300 FLUOROSCOPY TIME:  Fluoroscopic dose; 15 mGy COMPLICATIONS: None immediate. TECHNIQUE: The procedure, risks, benefits, and alternatives were explained to the patient and informed written consent was obtained. A timeout was performed prior to the initiation of the procedure. The patient's chest port a catheter had already been accessed at the infusion center. The patient was placed supine on  the fluoroscopy table. A preprocedural spot fluoroscopic image was obtained of the chest in existing port a catheter. Note was made of difficulty aspirating blood from the port a catheter. Contrast was injected via the Port a catheter and images were reviewed. The Port a catheter was flushed with a heparin dwell and de accessed. A dressing was placed. The patient tolerated the procedure well without immediate postprocedural complication. FINDINGS: *LEFT subclavian vein approach port a catheter with tip projected over the expected location of the mid aspect of the SVC. *There was difficulty aspirating blood from the port a catheter. *Catheter loop with kink at the subclavian venotomy. No evidence of catheter fracture or contrast extravasation. IMPRESSION: Malfunctioning LEFT subclavian-approach port catheter with looping and kinking. No overt catheter fracture or contrast extravasation. RECOMMENDATIONS: Patient was recommended for and elected to pursue removal of the subclavian-approach catheter, and replacement of a jugular-approach port. The procedural we will be scheduled on the soonest available date. Roanna Banning, MD [facility] Radiology Electronically Signed   By: Roanna Banning M.D.   On: 08/01/2023 18:55    Labs:  CBC: Recent Labs    06/18/23 0928 07/02/23 0956 07/16/23 0905 07/30/23 0926  WBC 5.4 5.0 5.0 4.5  HGB 8.5* 8.3* 9.5* 8.8*  HCT 27.9* 26.3* 30.3* 27.3*  PLT 642* 354 408* 421*    COAGS: Recent Labs    01/27/23 0833  INR 1.0    BMP: Recent Labs    07/02/23 0956 07/16/23 0905 07/30/23 0926 08/01/23 1543  NA 141 136 140 141  K 2.9* 3.7 2.8* 3.9  CL 110 105 109 107  CO2 26 27 25 29   GLUCOSE 80 125* 72 160*  BUN 6 15 5* 11  CALCIUM 8.0* 8.8* 8.5* 8.5*  CREATININE 0.92 1.11* 0.93 0.85  GFRNONAA >60 58* >60 >60    LIVER FUNCTION TESTS: Recent Labs    06/18/23 0928 07/02/23 0956 07/16/23 0905 07/30/23 0926   BILITOT 0.4 0.4 0.6 0.5  AST 14* 16 15 13*  ALT 8 13 16 12   ALKPHOS 122 173* 203* 173*  PROT 7.0 6.3* 7.2 6.7  ALBUMIN 2.6* 2.9* 3.3* 3.0*    TUMOR MARKERS:  No results for input(s): "AFPTM", "CEA", "CA199", "CHROMGRNA" in the last 8760 hours.  Assessment and Plan:  Pt with hx pancreatic carcinoma and prior surgically placed  left subclavian approach port a catheter which is now kinked, poorly functioning; pt scheduled today for left subclavian approach port a catheter removal and new image guided port a catheter placement    Risks and benefits of image guided port-a-catheter placement was discussed with the patient including, but not limited to bleeding, infection, pneumothorax, or fibrin sheath development and need for additional procedures.  All of the patient's questions were answered, patient is agreeable to proceed. Consent signed and in chart.  Thank you for allowing our service to participate in Addisson Frate 's care.  Electronically Signed: Loman Brooklyn, PA-C Caryn Bee Easton Sivertson,PA-C 08/07/2023, 1:08 PM    I spent a total of    25 Minutes in face to face in clinical consultation, greater than 50% of which was counseling/coordinating care for left subclavian approach catheter removal and image guided port a catheter placement.

## 2023-08-08 ENCOUNTER — Ambulatory Visit (HOSPITAL_COMMUNITY)
Admission: RE | Admit: 2023-08-08 | Discharge: 2023-08-08 | Disposition: A | Discharge status: Home / Self Care | Payer: BC Managed Care – PPO | Source: Ambulatory Visit | Attending: Interventional Radiology | Admitting: Interventional Radiology

## 2023-08-08 ENCOUNTER — Encounter (HOSPITAL_COMMUNITY): Payer: Self-pay

## 2023-08-08 ENCOUNTER — Other Ambulatory Visit (HOSPITAL_COMMUNITY): Payer: BC Managed Care – PPO

## 2023-08-08 ENCOUNTER — Other Ambulatory Visit: Payer: Self-pay

## 2023-08-08 DIAGNOSIS — Y839 Surgical procedure, unspecified as the cause of abnormal reaction of the patient, or of later complication, without mention of misadventure at the time of the procedure: Secondary | ICD-10-CM | POA: Insufficient documentation

## 2023-08-08 DIAGNOSIS — Z794 Long term (current) use of insulin: Secondary | ICD-10-CM | POA: Diagnosis not present

## 2023-08-08 DIAGNOSIS — E119 Type 2 diabetes mellitus without complications: Secondary | ICD-10-CM | POA: Diagnosis not present

## 2023-08-08 DIAGNOSIS — C259 Malignant neoplasm of pancreas, unspecified: Secondary | ICD-10-CM | POA: Insufficient documentation

## 2023-08-08 DIAGNOSIS — T82514A Breakdown (mechanical) of infusion catheter, initial encounter: Secondary | ICD-10-CM | POA: Diagnosis not present

## 2023-08-08 DIAGNOSIS — Z452 Encounter for adjustment and management of vascular access device: Secondary | ICD-10-CM | POA: Diagnosis not present

## 2023-08-08 DIAGNOSIS — Z800 Family history of malignant neoplasm of digestive organs: Secondary | ICD-10-CM | POA: Insufficient documentation

## 2023-08-08 DIAGNOSIS — Z79899 Other long term (current) drug therapy: Secondary | ICD-10-CM | POA: Diagnosis not present

## 2023-08-08 DIAGNOSIS — E785 Hyperlipidemia, unspecified: Secondary | ICD-10-CM | POA: Diagnosis not present

## 2023-08-08 DIAGNOSIS — I10 Essential (primary) hypertension: Secondary | ICD-10-CM | POA: Insufficient documentation

## 2023-08-08 HISTORY — PX: IR IMAGING GUIDED PORT INSERTION: IMG5740

## 2023-08-08 HISTORY — PX: IR REMOVAL TUN ACCESS W/ PORT W/O FL MOD SED: IMG2290

## 2023-08-08 LAB — GLUCOSE, CAPILLARY: Glucose-Capillary: 105 mg/dL — ABNORMAL HIGH (ref 70–99)

## 2023-08-08 MED ORDER — LIDOCAINE-EPINEPHRINE 1 %-1:100000 IJ SOLN
INTRAMUSCULAR | Status: AC
  Filled 2023-08-08: qty 1

## 2023-08-08 MED ORDER — SODIUM CHLORIDE 0.9 % IV SOLN: INTRAVENOUS | Status: DC

## 2023-08-08 MED ORDER — FENTANYL CITRATE (PF) 100 MCG/2ML IJ SOLN
INTRAMUSCULAR | Status: AC
Start: 2023-08-08 — End: ?
  Filled 2023-08-08: qty 4

## 2023-08-08 MED ORDER — MIDAZOLAM HCL 2 MG/2ML IJ SOLN
INTRAMUSCULAR | Status: AC | PRN
  Administered 2023-08-08 (×2): 1 mg via INTRAVENOUS

## 2023-08-08 MED ORDER — MIDAZOLAM HCL 2 MG/2ML IJ SOLN
INTRAMUSCULAR | Status: AC
  Filled 2023-08-08: qty 4

## 2023-08-08 MED ORDER — HEPARIN SOD (PORK) LOCK FLUSH 100 UNIT/ML IV SOLN
500.0000 [IU] | Freq: Once | INTRAVENOUS | Status: AC
  Administered 2023-08-08: 500 [IU] via INTRAVENOUS

## 2023-08-08 MED ORDER — HEPARIN SOD (PORK) LOCK FLUSH 100 UNIT/ML IV SOLN
INTRAVENOUS | Status: AC
  Filled 2023-08-08: qty 5

## 2023-08-08 MED ORDER — LIDOCAINE-EPINEPHRINE 1 %-1:100000 IJ SOLN
20.0000 mL | Freq: Once | INTRAMUSCULAR | Status: AC
  Administered 2023-08-08: 20 mL via INTRADERMAL

## 2023-08-08 MED ORDER — FENTANYL CITRATE (PF) 100 MCG/2ML IJ SOLN
INTRAMUSCULAR | Status: AC | PRN
  Administered 2023-08-08 (×2): 50 ug via INTRAVENOUS

## 2023-08-08 NOTE — Procedures (Addendum)
 Interventional Radiology Procedure Note  Procedure: Placement of a right IJ approach single lumen PowerPort.  Tip is positioned at the superior cavoatrial junction and catheter is ready for immediate use.   Same session removal of left subclavian port.    Complications: None  Recommendations:  - Ok to shower tomorrow - Do not submerge for 7 days - Routine line care  - ok to restart/continue all home meds  Signed,  Yvone Neu. Loreta Ave, DO, ABVM, RPVI

## 2023-08-08 NOTE — Discharge Instructions (Signed)

## 2023-08-10 ENCOUNTER — Other Ambulatory Visit: Payer: Self-pay | Admitting: Oncology

## 2023-08-13 ENCOUNTER — Inpatient Hospital Stay (HOSPITAL_BASED_OUTPATIENT_CLINIC_OR_DEPARTMENT_OTHER): Payer: BC Managed Care – PPO | Admitting: Nurse Practitioner

## 2023-08-13 ENCOUNTER — Inpatient Hospital Stay: Payer: BC Managed Care – PPO

## 2023-08-13 ENCOUNTER — Telehealth: Payer: Self-pay

## 2023-08-13 ENCOUNTER — Inpatient Hospital Stay: Payer: BC Managed Care – PPO | Attending: Genetic Counselor

## 2023-08-13 ENCOUNTER — Encounter: Payer: Self-pay | Admitting: Nurse Practitioner

## 2023-08-13 VITALS — BP 125/87 | HR 93 | Temp 98.2°F | Resp 18 | Ht 68.5 in | Wt 179.3 lb

## 2023-08-13 VITALS — BP 115/76 | HR 70 | Temp 98.6°F | Resp 16

## 2023-08-13 DIAGNOSIS — I10 Essential (primary) hypertension: Secondary | ICD-10-CM | POA: Diagnosis not present

## 2023-08-13 DIAGNOSIS — C250 Malignant neoplasm of head of pancreas: Secondary | ICD-10-CM | POA: Insufficient documentation

## 2023-08-13 DIAGNOSIS — C259 Malignant neoplasm of pancreas, unspecified: Secondary | ICD-10-CM | POA: Diagnosis not present

## 2023-08-13 DIAGNOSIS — Z79899 Other long term (current) drug therapy: Secondary | ICD-10-CM | POA: Diagnosis not present

## 2023-08-13 DIAGNOSIS — C3411 Malignant neoplasm of upper lobe, right bronchus or lung: Secondary | ICD-10-CM | POA: Diagnosis not present

## 2023-08-13 DIAGNOSIS — E876 Hypokalemia: Secondary | ICD-10-CM

## 2023-08-13 DIAGNOSIS — C7802 Secondary malignant neoplasm of left lung: Secondary | ICD-10-CM | POA: Diagnosis not present

## 2023-08-13 DIAGNOSIS — C7801 Secondary malignant neoplasm of right lung: Secondary | ICD-10-CM | POA: Diagnosis not present

## 2023-08-13 DIAGNOSIS — Z5111 Encounter for antineoplastic chemotherapy: Secondary | ICD-10-CM | POA: Insufficient documentation

## 2023-08-13 DIAGNOSIS — Z905 Acquired absence of kidney: Secondary | ICD-10-CM | POA: Insufficient documentation

## 2023-08-13 LAB — CMP (CANCER CENTER ONLY)
ALT: 28 U/L (ref 0–44)
AST: 31 U/L (ref 15–41)
Albumin: 3.5 g/dL (ref 3.5–5.0)
Alkaline Phosphatase: 193 U/L — ABNORMAL HIGH (ref 38–126)
Anion gap: 5 (ref 5–15)
BUN: 16 mg/dL (ref 6–20)
CO2: 26 mmol/L (ref 22–32)
Calcium: 8.8 mg/dL — ABNORMAL LOW (ref 8.9–10.3)
Chloride: 108 mmol/L (ref 98–111)
Creatinine: 1.1 mg/dL — ABNORMAL HIGH (ref 0.44–1.00)
GFR, Estimated: 59 mL/min — ABNORMAL LOW (ref >60)
Glucose, Bld: 145 mg/dL — ABNORMAL HIGH (ref 70–99)
Potassium: 3.5 mmol/L (ref 3.5–5.1)
Sodium: 139 mmol/L (ref 135–145)
Total Bilirubin: 0.4 mg/dL (ref 0.0–1.2)
Total Protein: 6.6 g/dL (ref 6.5–8.1)

## 2023-08-13 LAB — CBC WITH DIFFERENTIAL (CANCER CENTER ONLY)
Abs Immature Granulocytes: 0.01 10*3/uL (ref 0.00–0.07)
Basophils Absolute: 0.1 10*3/uL (ref 0.0–0.1)
Basophils Relative: 1 %
Eosinophils Absolute: 0.1 10*3/uL (ref 0.0–0.5)
Eosinophils Relative: 2 %
HCT: 28.5 % — ABNORMAL LOW (ref 36.0–46.0)
Hemoglobin: 9.1 g/dL — ABNORMAL LOW (ref 12.0–15.0)
Immature Granulocytes: 0 %
Lymphocytes Relative: 18 %
Lymphs Abs: 0.9 10*3/uL (ref 0.7–4.0)
MCH: 31.4 pg (ref 26.0–34.0)
MCHC: 31.9 g/dL (ref 30.0–36.0)
MCV: 98.3 fL (ref 80.0–100.0)
Monocytes Absolute: 0.4 10*3/uL (ref 0.1–1.0)
Monocytes Relative: 9 %
Neutro Abs: 3.4 10*3/uL (ref 1.7–7.7)
Neutrophils Relative %: 70 %
Platelet Count: 404 10*3/uL — ABNORMAL HIGH (ref 150–400)
RBC: 2.9 MIL/uL — ABNORMAL LOW (ref 3.87–5.11)
RDW: 17.8 % — ABNORMAL HIGH (ref 11.5–15.5)
WBC Count: 4.8 10*3/uL (ref 4.0–10.5)
nRBC: 0 % (ref 0.0–0.2)

## 2023-08-13 LAB — MAGNESIUM: Magnesium: 1.7 mg/dL (ref 1.7–2.4)

## 2023-08-13 MED ORDER — PALONOSETRON HCL INJECTION 0.25 MG/5ML
0.2500 mg | Freq: Once | INTRAVENOUS | Status: AC
  Administered 2023-08-13: 0.25 mg via INTRAVENOUS
  Filled 2023-08-13: qty 5

## 2023-08-13 MED ORDER — SODIUM CHLORIDE 0.9 % IV SOLN
400.0000 mg/m2 | Freq: Once | INTRAVENOUS | Status: AC
  Administered 2023-08-13: 776 mg via INTRAVENOUS
  Filled 2023-08-13: qty 38.8

## 2023-08-13 MED ORDER — SODIUM CHLORIDE 0.9 % IV SOLN
55.0000 mg/m2 | Freq: Once | INTRAVENOUS | Status: AC
  Administered 2023-08-13: 107.5 mg via INTRAVENOUS
  Filled 2023-08-13: qty 25

## 2023-08-13 MED ORDER — DEXAMETHASONE SODIUM PHOSPHATE 10 MG/ML IJ SOLN
10.0000 mg | Freq: Once | INTRAMUSCULAR | Status: AC
  Administered 2023-08-13: 10 mg via INTRAVENOUS
  Filled 2023-08-13: qty 1

## 2023-08-13 MED ORDER — SODIUM CHLORIDE 0.9 % IV SOLN: INTRAVENOUS | Status: DC

## 2023-08-13 MED ORDER — SODIUM CHLORIDE 0.9 % IV SOLN
2400.0000 mg/m2 | INTRAVENOUS | Status: DC
  Administered 2023-08-13: 5000 mg via INTRAVENOUS
  Filled 2023-08-13: qty 100

## 2023-08-13 MED ORDER — ATROPINE SULFATE 1 MG/ML IV SOLN
0.5000 mg | Freq: Once | INTRAVENOUS | Status: AC | PRN
  Administered 2023-08-13: 0.5 mg via INTRAVENOUS
  Filled 2023-08-13: qty 1

## 2023-08-13 MED ORDER — POTASSIUM CHLORIDE CRYS ER 20 MEQ PO TBCR: 20.0000 meq | EXTENDED_RELEASE_TABLET | Freq: Three times a day (TID) | ORAL | 1 refills | Status: DC

## 2023-08-13 NOTE — Telephone Encounter (Signed)
-----   Message from Lonna Cobb sent at 08/13/2023 12:17 PM EST ----- Please let her know the magnesium level is normal.

## 2023-08-13 NOTE — Progress Notes (Signed)
 [facility] OFFICE PROGRESS NOTE   Diagnosis: Pancreas cancer  INTERVAL HISTORY:   Alyssa Ross returns as scheduled.  She completed cycle 5 5-FU/liposomal irinotecan 07/30/2023.  She return to the office on 08/01/2023 for pump DC and IV potassium.  The Port-A-Cath was not working properly.  She was referred for a dye study the same day which showed a malfunctioning Port-A-Cath with "looping and kinking".  Removal and replacement recommended.  This was completed on 08/08/2023.  She denies nausea/vomiting.  No mouth sores.  No diarrhea.  She tends to develop constipation following treatment.  No hand or foot pain or redness.  She has a good appetite.  Objective:  Vital signs in last 24 hours:  Blood pressure 125/87, pulse 93, temperature 98.2 F (36.8 C), temperature source Temporal, resp. rate 18, height 5' 8.5" (1.74 m), weight 179 lb 4.8 oz (81.3 kg), last menstrual period 09/17/2012, SpO2 99%.    HEENT: No thrush or ulcers.  Scattered areas of hyperpigmentation in the mouth. Resp: Lungs clear bilaterally. Cardio: Regular rate and rhythm. GI: Abdomen soft and nontender.  No hepatosplenomegaly. Vascular: No leg edema. Skin: Palms without erythema. Port-A-Cath without erythema.  Lab Results:  Lab Results  Component Value Date   WBC 4.8 08/13/2023   HGB 9.1 (L) 08/13/2023   HCT 28.5 (L) 08/13/2023   MCV 98.3 08/13/2023   PLT 404 (H) 08/13/2023   NEUTROABS 3.4 08/13/2023    Imaging:  No results found.  Medications: I have reviewed the patient's current medications.  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 Tumor invades the duodenal wall and vascular groove, resection margins negative, 4/34 lymph nodes positive MSI-stable, tumor showed instability in 2 loci as did adjacent normal tissue Foundation 1-K-ras G12 V, microsatellite status and tumor mutation burden cannot be determined EUS FNA biopsy of  pancreas mass on 07/03/2018-well-differentiated neuroendocrine tumor CTs 01/29/2019-ill-defined pancreas head mass, 5 pulmonary nodules-1 with a small amount of central cavitation, tumor abuts the left margin of the portal vein indistinct density surrounding, hepatic artery, complex cystic lesion of the right kidney, right adrenal mass-characterized as an adenoma on a [facility] MRI 12/21/2018 [facility] 03/03/2019-no focal pancreas activity, no tracer accumulation within the suspicious pulmonary nodules, left uterine mass with tracer accumulation felt to represent a leiomyoma Elevated preoperative CA 19-9--CA 19-9 186 on 01/18/2019 CT chest 04/16/2019-multiple bilateral pulmonary nodules, some with increased cavitation, stable in size Cycle 1 FOLFIRINOX 04/27/2019 Cycle 2 FOLFIRINOX 05/11/2019 Cycle 3 FOLFIRINOX 05/23/2019 Cycle 4 FOLFIRINOX 06/08/2019 Cycle 5 FOLFIRINOX 06/22/2019 CT chest 07/02/2019-stable size of bilateral pulmonary nodules.  Dominant cavitary lesions in both lungs show increased cavitation with thinner walls.  Stable 2.1 cm right adrenal nodule. 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 No definite evidence of metastatic disease in the abdomen or pelvis. Markedly enlarged and heterogeneous appearing uterus, likely to represent multifocal fibroids. 1 of these lesions appears to be an exophytic subserosal fibroid in the posterior lateral aspect of the uterine body on the left side although this comes in close proximity to the left adnexa such that a primary ovarian lesion is difficult  to completely exclude. CTs 02/07/2020-slight enlargement of  bilateral lung nodules, some are cavitary, no evidence of metastatic disease in the abdomen or pelvis, stable right adrenal nodule, uterine fibroids CTs 04/26/2020-mild enlargement of pulmonary nodules, slight increase in trace pelvic fluid, new soft tissue thickening inferior to the cecal tip suspicious for peritoneal metastasis CT 05/26/2020-improved appearance of soft tissue at the inferior tip of the cecum, mildly thickened short appendix-findings suggestive of resolving appendicitis, stable small bibasilar pulmonary nodules, fibroids Plan biopsy of right cecal tip soft tissue canceled secondary to radiologic improvement CT chest 08/02/2020-enlargement and progressive cavitation of multiple bilateral lung nodules.  Some new nodules are present. CTs 10/24/2020- increase in size of pulmonary nodules, no new nodules, no evidence of metastatic disease in the abdomen, stable right adrenal nodule CT 01/09/2021-slight interval enlargement of pulmonary nodules, stable right adrenal nodule Navigation bronchoscopy 01/30/2021-left lower lobe cavitary nodule FNA-adenocarcinoma, brushing-adenocarcinoma.  Left lower lobe lavage-adenocarcinoma.  Right upper lobe brushing and FNA biopsy of cavitary nodule-adenocarcinoma-immunohistochemical profile consistent with pancreas adenocarcinoma Cycle 1 gemcitabine/Abraxane 03/28/2021 Cycle 2 gemcitabine/Abraxane 04/11/2021 Cycle 3 gemcitabine/Abraxane 04/25/2021 Cycle 4 gemcitabine/Abraxane 05/09/2021 Cycle 5 gemcitabine/Abraxane 05/23/2021 CT chest 06/05/2021-interval cavitation of multiple pulmonary nodules, some nodules have decreased in size, no new or enlarging nodules Cycle 6 gemcitabine/Abraxane 06/06/2021 Cycle 7 gemcitabine/Abraxane 06/21/2021 Cycle 8 gemcitabine/Abraxane 07/05/2021 Cycle 9 Gemcitabine/Abraxane 07/19/2021 Cycle 10 gemcitabine 08/01/2021-Abraxane held secondary to neuropathy CT chest  08/13/2021-mild decrease in size and wall thickness of multiple cavitary nodules, no new or progressive disease in the chest, indeterminate low-attenuation right liver lesions Cycle 11 gemcitabine 08/16/2021-Abraxane held secondary to neuropathy Cycle 12 gemcitabine 08/30/2021-Abraxane held secondary to neuropathy Cycle 13 gemcitabine 09/13/2021-Abraxane held secondary to neuropathy Cycle 14 gemcitabine 09/27/2021-Abraxane held secondary to neuropathy Cycle 15 gemcitabine 10/11/2021-Abraxane held secondary to neuropathy CTs 10/22/2021-no change in multiple cavitary lung nodules, no evidence of disease progression, ill-defined hypodense lesion in the posterior right liver suspicious for metastatic disease Cycle 16 gemcitabine 10/25/2021 Cycle 17 gemcitabine 11/08/2021 Cycle 18 Gemcitabine 11/22/2021 Cycle 19 gemcitabine 12/06/2021 Cycle 20 Gemcitabine 12/20/2021 CT 12/31/2021-mild increase in size of bilateral pulmonary metastases, stable subtle continuation right liver lesions Cycle 20 gemcitabine/Abraxane 01/03/2022 Cycle 21 gemcitabine/Abraxane 01/17/2022 Cycle 22 gemcitabine/Abraxane 01/31/2022 Cycle 23 gemcitabine/Abraxane 02/14/2022 Cycle 24 gemcitabine/Abraxane 02/28/2022 CTs 03/11/2022-widespread metastatic disease to the lungs again noted with slight involution of several of the pulmonary nodules, no definite new nodules noted; interval cavitation of solid lesion in the posterior aspect right lobe of the liver, no new liver lesions noted. Cycle 25 Gemcitabine/Abraxane 03/14/2022 Cycle 26 gemcitabine/Abraxane 03/28/2022 Cycle 27 gemcitabine/Abraxane 04/25/2022 Cycle 28 gemcitabine/Abraxane 05/09/2022 Cycle 29 Gemcitabine 05/23/2022, Abraxane held due to neuropathy CTs 06/04/2022-stable multifocal cavitary pulmonary nodules.  No definite new nodules.  No new nodules greater than a centimeter.  Decreased size of the right posterior hepatic lobe lesion.  Generalized heterogeneity and new focal areas of hypodensity  in the liver. Cycle 30 Gemcitabine 06/06/2022, Abraxane held due to neuropathy Cycle 31 gemcitabine 06/20/2022, Abraxane held due to neuropathy Cycle 32 gemcitabine 07/04/2022, Abraxane held due to neuropathy Cycle 33 gemcitabine  07/18/2022 ,Abraxane held due to neuropathy Cycle 34 gemcitabine 08/15/2022, Abraxane held due to neuropathy Cycle 35 gemcitabine 08/29/2022, Abraxane held due to neuropathy CTs 09/10/2022-enlarging pulmonary metastases.  Subtle poorly defined hypoattenuating lesions in the liver, less well-seen than on 06/04/2022.  Suspected new lesion in the dome of the right hepatic lobe Cycle 36 gemcitabine/Abraxane 09/12/2022 Cycle 37 gemcitabine/Abraxane 09/26/2022 Cycle 38 gemcitabine/Abraxane 10/10/2022 Cycle 39 gemcitabine/Abraxane 11/07/2022 Cycle 40 gemcitabine/Abraxane 11/21/2022 Cycle 41 gemcitabine/Abraxane 12/05/2022 Cycle 42 gemcitabine/Abraxane 12/19/2022  CT is 12/30/2022-new left hepatic lesion, stable bilateral pulmonary metastases Cycle 43 gemcitabine/Abraxane 01/02/2023 CT-guided biopsy of liver lesion 01/27/2023-acute/chronic inflammation, negative for malignancy, culture negative Cycle 44 gemcitabine/Abraxane 01/30/2023 Cycle 45 gemcitabine/Abraxane 02/13/2023 Cycle 46 gemcitabine/Abraxane 02/27/2023 Cycle 47 gemcitabine/Abraxane 03/13/2023 Cycle 48 gemcitabine/Abraxane 03/27/2023 CTs 04/21/2023-numerous solid and cavitary lung nodules overall similar, some slightly enlarged; multiple new and enlarging rim-enhancing liver lesions. 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  Apixaban 10/24/2022 Port-A-Cath malfunction 08/01/2023.  Port-A-Cath removed and replaced 08/08/2023.    Disposition: Ms. Hayworth appears stable.  She has completed 5 cycles of FOLFIRI with liposomal irinotecan.  She is tolerating treatment well aside from mild constipation which may be related to the atropine premedication.  Plan to proceed with cycle 6 today as scheduled.  She will begin a laxative tonight.  Restaging CTs prior to next office visit.  CBC and chemistry panel reviewed.  Labs adequate to proceed as above.  She will return for follow-up in 2 weeks.  We are available to see her sooner if needed.   Alyssa Ross ANP/GNP-BC   08/13/2023  11:16 AM

## 2023-08-13 NOTE — Telephone Encounter (Signed)
 Patient gave verbal understanding and had no further questions or concerns

## 2023-08-13 NOTE — Progress Notes (Signed)
 Patients port flushed without difficulty.  Good blood return noted with no bruising or swelling noted at site.  Patient remains accessed for treatment.

## 2023-08-13 NOTE — Progress Notes (Signed)
 Patient seen by Lonna Cobb NP today  Vitals are within treatment parameters:Yes   Labs are within treatment parameters: Yes Awaiting the results of her magnesium test poss.  will be received mag  Treatment plan has been signed: Yes   Per physician team, Patient is ready for treatment and there are NO modifications to the treatment plan.

## 2023-08-13 NOTE — Patient Instructions (Signed)
 [facility] [place]  Discharge Instructions: Thank you for choosing [facility] to provide your oncology and hematology care.   If you have a lab appointment with the Cancer Center, please go directly to the Cancer Center and check in at the registration area.   Wear comfortable clothing and clothing appropriate for easy access to any Portacath or PICC line.   We strive to give you quality time with your provider. You may need to reschedule your appointment if you arrive late (15 or more minutes).  Arriving late affects you and other patients whose appointments are after yours.  Also, if you miss three or more appointments without notifying the office, you may be dismissed from the clinic at the provider's discretion.      For prescription refill requests, have your pharmacy contact our office and allow 72 hours for refills to be completed.    Today you received the following chemotherapy and/or immunotherapy agents Irinotecan, Leucovorin, and Adrucil.  Irinotecan Liposome Injection What is this medication? IRINOTECAN LIPOSOME (eye ri noe TEE kan LIP oh som) treats pancreatic cancer. It works by slowing down the growth of cancer cells. This medicine may be used for other purposes; ask your health care provider or pharmacist if you have questions. COMMON BRAND NAME(S): ONIVYDE What should I tell my care team before I take this medication? They need to know if you have any of these conditions: Blockage in your bowels Dehydration Infection Low white blood cell levels Lung disease An unusual or allergic reaction to irinotecan liposome, irinotecan, other medications, foods, dyes, or preservatives Pregnant or trying to get pregnant Breast-feeding How should I use this medication? This medication is injected into a vein. It is given by your care team in a hospital or clinic setting. Talk to your care team about the use of this  medication in children. Special care may be needed. Overdosage: If you think you have taken too much of this medicine contact a poison control center or emergency room at once. NOTE: This medicine is only for you. Do not share this medicine with others. What if I miss a dose? Keep appointments for follow-up doses. It is important not to miss your dose. Call your care team if you are unable to keep an appointment. What may interact with this medication? Do not take this medication with any of the following: Itraconazole This medication may also interact with the following: Certain antivirals for HIV or AIDS Certain medications for seizures, such as carbamazepine, fosphenytoin, phenytoin, phenobarbital Clarithromycin Gemfibrozil Nefazodone Rifabutin Rifampin Rifapentine St. John's Wort Voriconazole This list may not describe all possible interactions. Give your health care provider a list of all the medicines, herbs, non-prescription drugs, or dietary supplements you use. Also tell them if you smoke, drink alcohol, or use illegal drugs. Some items may interact with your medicine. What should I watch for while using this medication? This medication may make you feel generally unwell. This is not uncommon as chemotherapy can affect healthy cells as well as cancer cells. Report any side effects. Continue your course of treatment even though you feel ill unless your care team tells you to stop. You may need blood work while you are taking this medication. This medication can cause serious side effects and allergic reactions. To reduce your risk, your care team may give you other medications to take before receiving this one. Be sure to follow the directions from your  care team. Check with your care team if you get an attack of diarrhea, nausea and vomiting, or if you sweat a lot. The loss of too much body fluid can make it dangerous for you to take this medication. This medication may cause  infertility. Talk to your care team if you are concerned about your fertility. Talk to your care team if you wish to become pregnant or if you think you might be pregnant. This medication can cause serious birth defects if taken during pregnancy or if you get pregnant within 7 months after stopping therapy. A negative pregnancy test is required before starting this medication. A reliable form of contraception is recommended while taking this medication and for 7 months after stopping it. Talk to your care team about reliable forms of contraception. Use a condom during sex and for 4 months after stopping therapy. Tell your care team right away if you think your partner might be pregnant. This medication can cause serious birth defects. Do not breast-feed while taking this medication and for 1 month after stopping therapy. This medication may increase your risk of getting an infection. Call your care team for advice if you get a fever, chills, sore throat, or other symptoms of a cold or flu. Do not treat yourself. Try to avoid being around people who are sick. Avoid taking medications that contain aspirin, acetaminophen, ibuprofen, naproxen, or ketoprofen unless instructed by your care team. These medications may hide a fever. Be careful brushing or flossing your teeth or using a toothpick because you may get an infection or bleed more easily. If you have any dental work done, tell your dentist you are receiving this medication. What side effects may I notice from receiving this medication? Side effects that you should report to your care team as soon as possible: Allergic reactions or angioedema--skin rash, itching or hives, swelling of the face, eyes, lips, tongue, arms, or legs, trouble swallowing or breathing Dry cough, shortness of breath or trouble breathing Diarrhea Infection--fever, chills, cough, or sore throat Side effects that usually do not require medical attention (report to your care team  if they continue or are bothersome): Fatigue Loss of appetite Nausea Pain, redness, or swelling with sores inside the mouth or throat Vomiting This list may not describe all possible side effects. Call your doctor for medical advice about side effects. You may report side effects to FDA at 1-800-FDA-1088. Where should I keep my medication? This medication is given in a hospital or clinic. It will not be stored at home. NOTE: This sheet is a summary. It may not cover all possible information. If you have questions about this medicine, talk to your doctor, pharmacist, or health care provider.  2024 Elsevier/Gold Standard (2021-07-26 00:00:00)  Leucovorin Injection What is this medication? LEUCOVORIN (loo koe VOR in) prevents side effects from certain medications, such as methotrexate. It works by increasing folate levels. This helps protect healthy cells in your body. It may also be used to treat anemia caused by low levels of folate. It can also be used with fluorouracil, a type of chemotherapy, to treat colorectal cancer. It works by increasing the effects of fluorouracil in the body. This medicine may be used for other purposes; ask your health care provider or pharmacist if you have questions. What should I tell my care team before I take this medication? They need to know if you have any of these conditions: Anemia from low levels of vitamin B12 in the blood An unusual  or allergic reaction to leucovorin, folic acid, other medications, foods, dyes, or preservatives Pregnant or trying to get pregnant Breastfeeding How should I use this medication? This medication is injected into a vein or a muscle. It is given by your care team in a hospital or clinic setting. Talk to your care team about the use of this medication in children. Special care may be needed. Overdosage: If you think you have taken too much of this medicine contact a poison control center or emergency room at once. NOTE:  This medicine is only for you. Do not share this medicine with others. What if I miss a dose? Keep appointments for follow-up doses. It is important not to miss your dose. Call your care team if you are unable to keep an appointment. What may interact with this medication? Capecitabine Fluorouracil Phenobarbital Phenytoin Primidone Trimethoprim;sulfamethoxazole This list may not describe all possible interactions. Give your health care provider a list of all the medicines, herbs, non-prescription drugs, or dietary supplements you use. Also tell them if you smoke, drink alcohol, or use illegal drugs. Some items may interact with your medicine. What should I watch for while using this medication? Your condition will be monitored carefully while you are receiving this medication. This medication may increase the side effects of 5-fluorouracil. Tell your care team if you have diarrhea or mouth sores that do not get better or that get worse. What side effects may I notice from receiving this medication? Side effects that you should report to your care team as soon as possible: Allergic reactions--skin rash, itching, hives, swelling of the face, lips, tongue, or throat This list may not describe all possible side effects. Call your doctor for medical advice about side effects. You may report side effects to FDA at 1-800-FDA-1088. Where should I keep my medication? This medication is given in a hospital or clinic. It will not be stored at home. NOTE: This sheet is a summary. It may not cover all possible information. If you have questions about this medicine, talk to your doctor, pharmacist, or health care provider.  2024 Elsevier/Gold Standard (2021-10-30 00:00:00)  Fluorouracil Injection What is this medication? FLUOROURACIL (flure oh YOOR a sil) treats some types of cancer. It works by slowing down the growth of cancer cells. This medicine may be used for other purposes; ask your health care  provider or pharmacist if you have questions. COMMON BRAND NAME(S): Adrucil What should I tell my care team before I take this medication? They need to know if you have any of these conditions: Blood disorders Dihydropyrimidine dehydrogenase (DPD) deficiency Infection, such as chickenpox, cold sores, herpes Kidney disease Liver disease Poor nutrition Recent or ongoing radiation therapy An unusual or allergic reaction to fluorouracil, other medications, foods, dyes, or preservatives If you or your partner are pregnant or trying to get pregnant Breast-feeding How should I use this medication? This medication is injected into a vein. It is administered by your care team in a hospital or clinic setting. Talk to your care team about the use of this medication in children. Special care may be needed. Overdosage: If you think you have taken too much of this medicine contact a poison control center or emergency room at once. NOTE: This medicine is only for you. Do not share this medicine with others. What if I miss a dose? Keep appointments for follow-up doses. It is important not to miss your dose. Call your care team if you are unable to keep an appointment.  What may interact with this medication? Do not take this medication with any of the following: Live virus vaccines This medication may also interact with the following: Medications that treat or prevent blood clots, such as warfarin, enoxaparin, dalteparin This list may not describe all possible interactions. Give your health care provider a list of all the medicines, herbs, non-prescription drugs, or dietary supplements you use. Also tell them if you smoke, drink alcohol, or use illegal drugs. Some items may interact with your medicine. What should I watch for while using this medication? Your condition will be monitored carefully while you are receiving this medication. This medication may make you feel generally unwell. This is not  uncommon as chemotherapy can affect healthy cells as well as cancer cells. Report any side effects. Continue your course of treatment even though you feel ill unless your care team tells you to stop. In some cases, you may be given additional medications to help with side effects. Follow all directions for their use. This medication may increase your risk of getting an infection. Call your care team for advice if you get a fever, chills, sore throat, or other symptoms of a cold or flu. Do not treat yourself. Try to avoid being around people who are sick. This medication may increase your risk to bruise or bleed. Call your care team if you notice any unusual bleeding. Be careful brushing or flossing your teeth or using a toothpick because you may get an infection or bleed more easily. If you have any dental work done, tell your dentist you are receiving this medication. Avoid taking medications that contain aspirin, acetaminophen, ibuprofen, naproxen, or ketoprofen unless instructed by your care team. These medications may hide a fever. Do not treat diarrhea with over the counter products. Contact your care team if you have diarrhea that lasts more than 2 days or if it is severe and watery. This medication can make you more sensitive to the sun. Keep out of the sun. If you cannot avoid being in the sun, wear protective clothing and sunscreen. Do not use sun lamps, tanning beds, or tanning booths. Talk to your care team if you or your partner wish to become pregnant or think you might be pregnant. This medication can cause serious birth defects if taken during pregnancy and for 3 months after the last dose. A reliable form of contraception is recommended while taking this medication and for 3 months after the last dose. Talk to your care team about effective forms of contraception. Do not father a child while taking this medication and for 3 months after the last dose. Use a condom while having sex during this  time period. Do not breastfeed while taking this medication. This medication may cause infertility. Talk to your care team if you are concerned about your fertility. What side effects may I notice from receiving this medication? Side effects that you should report to your care team as soon as possible: Allergic reactions--skin rash, itching, hives, swelling of the face, lips, tongue, or throat Heart attack--pain or tightness in the chest, shoulders, arms, or jaw, nausea, shortness of breath, cold or clammy skin, feeling faint or lightheaded Heart failure--shortness of breath, swelling of the ankles, feet, or hands, sudden weight gain, unusual weakness or fatigue Heart rhythm changes--fast or irregular heartbeat, dizziness, feeling faint or lightheaded, chest pain, trouble breathing High ammonia level--unusual weakness or fatigue, confusion, loss of appetite, nausea, vomiting, seizures Infection--fever, chills, cough, sore throat, wounds that don't heal, pain  or trouble when passing urine, general feeling of discomfort or being unwell Low red blood cell level--unusual weakness or fatigue, dizziness, headache, trouble breathing Pain, tingling, or numbness in the hands or feet, muscle weakness, change in vision, confusion or trouble speaking, loss of balance or coordination, trouble walking, seizures Redness, swelling, and blistering of the skin over hands and feet Severe or prolonged diarrhea Unusual bruising or bleeding Side effects that usually do not require medical attention (report to your care team if they continue or are bothersome): Dry skin Headache Increased tears Nausea Pain, redness, or swelling with sores inside the mouth or throat Sensitivity to light Vomiting This list may not describe all possible side effects. Call your doctor for medical advice about side effects. You may report side effects to FDA at 1-800-FDA-1088. Where should I keep my medication? This medication is  given in a hospital or clinic. It will not be stored at home. NOTE: This sheet is a summary. It may not cover all possible information. If you have questions about this medicine, talk to your doctor, pharmacist, or health care provider.  2024 Elsevier/Gold Standard (2021-10-02 00:00:00)    To help prevent nausea and vomiting after your treatment, we encourage you to take your nausea medication as directed.  BELOW ARE SYMPTOMS THAT SHOULD BE REPORTED IMMEDIATELY: *FEVER GREATER THAN 100.4 F (38 C) OR HIGHER *CHILLS OR SWEATING *NAUSEA AND VOMITING THAT IS NOT CONTROLLED WITH YOUR NAUSEA MEDICATION *UNUSUAL SHORTNESS OF BREATH *UNUSUAL BRUISING OR BLEEDING *URINARY PROBLEMS (pain or burning when urinating, or frequent urination) *BOWEL PROBLEMS (unusual diarrhea, constipation, pain near the anus) TENDERNESS IN MOUTH AND THROAT WITH OR WITHOUT PRESENCE OF ULCERS (sore throat, sores in mouth, or a toothache) UNUSUAL RASH, SWELLING OR PAIN  UNUSUAL VAGINAL DISCHARGE OR ITCHING   Items with * indicate a potential emergency and should be followed up as soon as possible or go to the Emergency Department if any problems should occur.  Please show the CHEMOTHERAPY ALERT CARD or IMMUNOTHERAPY ALERT CARD at check-in to the Emergency Department and triage nurse.  Should you have questions after your visit or need to cancel or reschedule your appointment, please contact [facility]  Dept: [phone]  and follow the prompts.  Office hours are 8:00 a.m. to 4:30 p.m. Monday - Friday. Please note that voicemails left after 4:00 p.m. may not be returned until the following business day.  We are closed weekends and major holidays. You have access to a nurse at all times for urgent questions. Please call the main number to the clinic Dept: [phone] and follow the prompts.   For any non-urgent questions, you may also contact your provider using  MyChart. We now offer e-Visits for anyone 16 and older to request care online for non-urgent symptoms. For details visit [email or web address].   Also download the MyChart app! Go to the app store, search "MyChart", open the app, select [facility], and log in with your MyChart username and password.

## 2023-08-13 NOTE — Progress Notes (Signed)
 Patient presents today for chemotherapy/immunotherapy infusion of Irinotecan, Leucovorin, and Adrucil. Patient is in satisfactory condition with no new complaints voiced.  Vital signs are stable.  Labs reviewed by Lonna Cobb PA during the office visit and all labs are within treatment parameters.  We will proceed with treatment per MD orders.   Patient tolerated treatment well with no complaints voiced.  Patient left ambulatory in stable condition.  Vital signs stable at discharge.  Follow up as scheduled.

## 2023-08-14 LAB — CANCER ANTIGEN 19-9: CA 19-9: 395 U/mL — ABNORMAL HIGH (ref 0–35)

## 2023-08-15 ENCOUNTER — Inpatient Hospital Stay: Payer: BC Managed Care – PPO

## 2023-08-15 VITALS — BP 116/74 | HR 72 | Temp 98.8°F | Resp 18

## 2023-08-15 DIAGNOSIS — C3411 Malignant neoplasm of upper lobe, right bronchus or lung: Secondary | ICD-10-CM | POA: Diagnosis not present

## 2023-08-15 DIAGNOSIS — C7802 Secondary malignant neoplasm of left lung: Secondary | ICD-10-CM | POA: Diagnosis not present

## 2023-08-15 DIAGNOSIS — Z905 Acquired absence of kidney: Secondary | ICD-10-CM | POA: Diagnosis not present

## 2023-08-15 DIAGNOSIS — Z79899 Other long term (current) drug therapy: Secondary | ICD-10-CM | POA: Diagnosis not present

## 2023-08-15 DIAGNOSIS — C250 Malignant neoplasm of head of pancreas: Secondary | ICD-10-CM

## 2023-08-15 DIAGNOSIS — C7801 Secondary malignant neoplasm of right lung: Secondary | ICD-10-CM | POA: Diagnosis not present

## 2023-08-15 DIAGNOSIS — Z5111 Encounter for antineoplastic chemotherapy: Secondary | ICD-10-CM | POA: Diagnosis not present

## 2023-08-15 DIAGNOSIS — I10 Essential (primary) hypertension: Secondary | ICD-10-CM | POA: Diagnosis not present

## 2023-08-15 MED ORDER — SODIUM CHLORIDE 0.9% FLUSH
10.0000 mL | INTRAVENOUS | Status: DC | PRN
  Administered 2023-08-15: 10 mL

## 2023-08-15 MED ORDER — HEPARIN SOD (PORK) LOCK FLUSH 100 UNIT/ML IV SOLN
500.0000 [IU] | Freq: Once | INTRAVENOUS | Status: AC | PRN
  Administered 2023-08-15: 500 [IU]

## 2023-08-15 NOTE — Patient Instructions (Signed)

## 2023-08-19 DIAGNOSIS — C259 Malignant neoplasm of pancreas, unspecified: Secondary | ICD-10-CM | POA: Diagnosis not present

## 2023-08-20 ENCOUNTER — Ambulatory Visit (HOSPITAL_BASED_OUTPATIENT_CLINIC_OR_DEPARTMENT_OTHER): Admission: RE | Admit: 2023-08-20 | Payer: BC Managed Care – PPO | Source: Ambulatory Visit

## 2023-08-20 ENCOUNTER — Ambulatory Visit (HOSPITAL_BASED_OUTPATIENT_CLINIC_OR_DEPARTMENT_OTHER)
Admission: RE | Admit: 2023-08-20 | Discharge: 2023-08-20 | Disposition: A | Discharge status: Home / Self Care | Source: Ambulatory Visit | Attending: Nurse Practitioner | Admitting: Nurse Practitioner

## 2023-08-20 DIAGNOSIS — C787 Secondary malignant neoplasm of liver and intrahepatic bile duct: Secondary | ICD-10-CM | POA: Diagnosis not present

## 2023-08-20 DIAGNOSIS — C250 Malignant neoplasm of head of pancreas: Secondary | ICD-10-CM | POA: Diagnosis not present

## 2023-08-20 DIAGNOSIS — D259 Leiomyoma of uterus, unspecified: Secondary | ICD-10-CM | POA: Diagnosis not present

## 2023-08-20 DIAGNOSIS — N939 Abnormal uterine and vaginal bleeding, unspecified: Secondary | ICD-10-CM | POA: Diagnosis not present

## 2023-08-20 DIAGNOSIS — D3501 Benign neoplasm of right adrenal gland: Secondary | ICD-10-CM | POA: Diagnosis not present

## 2023-08-20 DIAGNOSIS — C259 Malignant neoplasm of pancreas, unspecified: Secondary | ICD-10-CM | POA: Diagnosis not present

## 2023-08-20 MED ORDER — IOHEXOL 300 MG/ML  SOLN
100.0000 mL | Freq: Once | INTRAMUSCULAR | Status: AC | PRN
  Administered 2023-08-20: 100 mL via INTRAVENOUS

## 2023-08-21 ENCOUNTER — Other Ambulatory Visit: Payer: Self-pay | Admitting: Oncology

## 2023-08-21 DIAGNOSIS — C250 Malignant neoplasm of head of pancreas: Secondary | ICD-10-CM

## 2023-08-22 NOTE — Progress Notes (Signed)
 Palliative Medicine [facility]  [phone] [phone]   Name: Alyssa Ross Date: 08/22/2023 MRN: [id number]  DOB: 06/24/1965  Patient Care Team: Dorothyann Peng, MD as PCP - General (Internal Medicine) Barrie Folk, RN (Inactive) as Oncology Nurse Navigator Pickenpack-Cousar, Arty Baumgartner, NP as Nurse Practitioner [facility] and Palliative Medicine)   I connected with Alyssa Ross on 08/22/23 at  8:30 AM EDT by phone and verified that I am speaking with the correct person using two identifiers.   I discussed the limitations, risks, security and privacy concerns of performing an evaluation and management service by telemedicine and the availability of in-person appointments. I also discussed with the patient that there may be a patient responsible charge related to this service. The patient expressed understanding and agreed to proceed.   Other persons participating in the visit and their role in the encounter: n/a   Patient's location: home  Provider's location: [facility]   Chief Complaint: f/u of symptom management   REASON FOR CONSULTATION: Alyssa Ross is a 58 y.o. female with oncologic medical history including pancreatic cancer (01/2019). The patient is 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 and is currently receiving FOLFIRI with liposomal irinotecan. Palliative ask to see for symptom management and goals of care.    SOCIAL HISTORY:     reports that she has never smoked. She has never used smokeless tobacco. She reports that she does not currently use alcohol. She reports that she does not use drugs.  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   Lung nodule    pancreatic ca dx'd 11/2018   Neuroendocrine Tumor of the pancreas   PONV (postoperative nausea and vomiting)    nausea  vomitting after 12/25/18 ERCP   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   Yeast vaginitis 2006    PAST SURGICAL HISTORY:  Past Surgical History:  Procedure Laterality Date   BILIARY STENT PLACEMENT N/A 12/25/2018   Procedure: BILIARY STENT PLACEMENT;  Surgeon: Jeani Hawking, MD;  [facility];  Service: Endoscopy;  Laterality: N/A;   BILIARY STENT PLACEMENT N/A 01/01/2019   Procedure: BILIARY STENT PLACEMENT;  Surgeon: Jeani Hawking, MD;  [facility];  Service: Endoscopy;  Laterality: N/A;   BRONCHIAL BIOPSY  01/30/2021   Procedure: BRONCHIAL BIOPSIES;  Surgeon: Josephine Igo, DO;  [facility];  Service: Pulmonary;;   BRONCHIAL BRUSHINGS  01/30/2021   Procedure: BRONCHIAL BRUSHINGS;  Surgeon: Josephine Igo, DO;  [facility];  Service: Pulmonary;;   BRONCHIAL NEEDLE ASPIRATION BIOPSY  01/30/2021   Procedure: BRONCHIAL NEEDLE ASPIRATION BIOPSIES;  Surgeon: Josephine Igo, DO;  [facility];  Service: Pulmonary;;   BRONCHIAL WASHINGS  01/30/2021   Procedure: BRONCHIAL WASHINGS;  Surgeon: Josephine Igo, DO;  [facility];  Service: Pulmonary;;   DILATATION & CURETTAGE/HYSTEROSCOPY WITH TRUECLEAR N/A 01/20/2014   Procedure: DILATATION & CURETTAGE/HYSTEROSCOPY WITH TRUCLEAR;  Surgeon: Michael Litter, MD;  Location: [facility];  Service:  Gynecology;  Laterality: N/A;   DILATATION & CURRETTAGE/HYSTEROSCOPY WITH RESECTOCOPE N/A 01/20/2014   Procedure: DILATATION & CURETTAGE/HYSTEROSCOPY WITH RESECTOCOPE;  Surgeon: Michael Litter, MD;  Location: [facility];  Service: Gynecology;  Laterality: N/A;   ENDOSCOPIC  RETROGRADE CHOLANGIOPANCREATOGRAPHY (ERCP) WITH PROPOFOL N/A 12/25/2018   Procedure: ENDOSCOPIC RETROGRADE CHOLANGIOPANCREATOGRAPHY (ERCP) WITH PROPOFOL;  Surgeon: Jeani Hawking, MD;  [facility];  Service: Endoscopy;  Laterality: N/A;   ENDOSCOPIC RETROGRADE CHOLANGIOPANCREATOGRAPHY (ERCP) WITH PROPOFOL N/A 01/01/2019   Procedure: ENDOSCOPIC RETROGRADE CHOLANGIOPANCREATOGRAPHY (ERCP) WITH PROPOFOL;  Surgeon: Jeani Hawking, MD;  [facility];  Service: Endoscopy;  Laterality: N/A;   ERCP  12/25/2018   FINE NEEDLE ASPIRATION N/A 01/01/2019   Procedure: FINE NEEDLE ASPIRATION (FNA) LINEAR;  Surgeon: Jeani Hawking, MD;  [facility];  Service: Endoscopy;  Laterality: N/A;   IR CV LINE INJECTION  08/01/2023   IR IMAGING GUIDED PORT INSERTION  08/08/2023   IR REMOVAL TUN ACCESS W/ PORT W/O FL MOD SED  08/08/2023   LAPAROSCOPY N/A 03/04/2019   Procedure: LAPAROSCOPY DIAGNOSTIC;  Surgeon: Almond Lint, MD;  [facility];  Service: General;  Laterality: N/A;  GENERAL AND EPIDURAL   PARTIAL NEPHRECTOMY Right 03/04/2019   Procedure: Nephrectomy Partial;  Surgeon: Malen Gauze, MD;  Location: [facility] OR;  Service: Urology;  Laterality: Right;   PORTACATH PLACEMENT Left 04/21/2019   Procedure: INSERTION PORT-A-CATH;  Surgeon: Almond Lint, MD;  [facility];  Service: General;  Laterality: Left;   SPHINCTEROTOMY  12/25/2018   Procedure: Dennison Mascot;  Surgeon: Jeani Hawking, MD;  [facility];  Service: Endoscopy;;   STENT REMOVAL  01/01/2019   Procedure: STENT REMOVAL;  Surgeon: Jeani Hawking, MD;  [facility];  Service: Endoscopy;;   UPPER ESOPHAGEAL ENDOSCOPIC ULTRASOUND (EUS) N/A 01/01/2019   Procedure: UPPER ESOPHAGEAL ENDOSCOPIC ULTRASOUND (EUS);  Surgeon: Jeani Hawking, MD;  Location: [facility];  Service: Endoscopy;  Laterality: N/A;   VIDEO BRONCHOSCOPY WITH ENDOBRONCHIAL NAVIGATION Bilateral 01/30/2021   Procedure: VIDEO BRONCHOSCOPY  WITH ENDOBRONCHIAL NAVIGATION;  Surgeon: Josephine Igo, DO;  [facility];  Service: Pulmonary;  Laterality: Bilateral;  ION   VIDEO BRONCHOSCOPY WITH RADIAL ENDOBRONCHIAL ULTRASOUND  01/30/2021   Procedure: RADIAL ENDOBRONCHIAL ULTRASOUND;  Surgeon: Josephine Igo, DO;  [facility];  Service: Pulmonary;;   WHIPPLE PROCEDURE N/A 03/04/2019   Procedure: WHIPPLE PROCEDURE;  Surgeon: Almond Lint, MD;  Location: [facility] OR;  Service: General;  Laterality: N/A;  GENERAL AND EPIDURAL    HEMATOLOGY/ONCOLOGY HISTORY:  Oncology History  Pancreatic cancer (HCC)  03/02/2019 Genetic Testing   TERT c.483G>A VUS identified on the multicancer panel.  The Multi-Gene Panel offered by [facility] includes sequencing and/or deletion duplication testing of the following 85 genes: AIP, ALK, APC, ATM, AXIN2,BAP1,  BARD1, BLM, BMPR1A, BRCA1, BRCA2, BRIP1, CASR, CDC73, CDH1, CDK4, CDKN1B, CDKN1C, CDKN2A (p14ARF), CDKN2A (p16INK4a), CEBPA, CHEK2, CTNNA1, DICER1, DIS3L2, EGFR (c.2369C>T, p.Thr790Met variant only), EPCAM (Deletion/duplication testing only), FH, FLCN, GATA2, GPC3, GREM1 (Promoter region deletion/duplication testing only), HOXB13 (c.251G>A, p.Gly84Glu), HRAS, KIT, MAX, MEN1, MET, MITF (c.952G>A, p.Glu318Lys variant only), MLH1, MSH2, MSH3, MSH6, MUTYH, NBN, NF1, NF2, NTHL1, PALB2, PDGFRA, PHOX2B, PMS2, POLD1, POLE, POT1, PRKAR1A, PTCH1, PTEN, RAD50, RAD51C, RAD51D, RB1, RECQL4, RET, RNF43, RUNX1, SDHAF2, SDHA (sequence changes only), SDHB, SDHC, SDHD, SMAD4, SMARCA4, SMARCB1, SMARCE1, STK11, SUFU, TERC, TERT, TMEM127, TP53, TSC1, TSC2, VHL, WRN and WT1.  The report date is February 19, 2019.  UPDATE: TERT c.483G>A (Silent) VUS has been reclassified as Likely Benign.  The amended report date is February 07, 2021.   03/04/2019 Initial Diagnosis   Pancreatic cancer (HCC)   03/30/2019 Cancer Staging   Staging form: Exocrine Pancreas, AJCC 8th Edition - Pathologic: Stage III (pT3, pN2, cM0) -  Signed by Ladene Artist, MD on 03/30/2019   04/27/2019 - 08/05/2019 Chemotherapy         03/28/2021 - 01/31/2022 Chemotherapy   Patient is on Treatment Plan : PANCREATIC Abraxane / Gemcitabine D1,8,15 q28d     03/28/2021 - 03/27/2023 Chemotherapy   Patient is on Treatment Plan : PANCREATIC Abraxane D1,8,15 + Gemcitabine D1,8,15 q28d     05/21/2023 -  Chemotherapy   Patient is on Treatment Plan : PANCREAS Liposomal Irinotecan + Leucovorin + 5-FU IVCI q14d       ALLERGIES:  is allergic to cherry and lemon oil.  MEDICATIONS:  Current Outpatient Medications  Medication Sig Dispense Refill   apixaban (ELIQUIS) 5 MG TABS tablet Take 1 tablet (5 mg total) by mouth 2 (two) times daily. 180 tablet 0   cetirizine (ZYRTEC) 10 MG tablet Take 10 mg by mouth daily.     Continuous Blood Gluc Sensor (FREESTYLE LIBRE 2 SENSOR) MISC APPLY EVERY 14 DAYS 2 each 5   glucose blood test strip Check sugar 2 times daily. 100 each 12   Insulin Degludec (TRESIBA FLEXTOUCH [place]) Inject into the skin. 12-18 Units     insulin lispro (HUMALOG KWIKPEN) 200 UNIT/ML KwikPen 6 units with breakfast, 13 units with lunch, 16 units with dinner     Insulin Pen Needle (BD PEN NEEDLE NANO U/F) 32G X 4 MM MISC USE TO INJECT INSULIN THREE TIMES A DAY 270 each 3   lidocaine-prilocaine (EMLA) cream Apply 1 Application topically as directed. Apply 1 hour prior to stick and cover with plastic wrap (Patient not taking: Reported on 08/13/2023) 30 g 5   magic mouthwash (nystatin, diphenhydrAMINE, alum & mag hydroxide) suspension mixture Take 5 mLs by mouth 4 (four) times daily as needed for mouth pain. (Patient not taking: Reported on 08/13/2023) 240 mL 1   magnesium oxide (MAG-OX) 400 (240 Mg) MG tablet TAKE 1 TABLET(400 MG) BY MOUTH TWICE DAILY 180 tablet 1   ondansetron (ZOFRAN) 8 MG tablet Take 1 tablet (8 mg total) by mouth every 8 (eight) hours as needed for nausea or vomiting (do not begin until 72 hours after chemo). (Patient not  taking: Reported on 06/05/2023) 20 tablet 3   potassium chloride SA (KLOR-CON M) 20 MEQ tablet Take 1 tablet (20 mEq total) by mouth 3 (three) times daily. 270 tablet 1   prochlorperazine (COMPAZINE) 10 MG tablet TAKE 1 TABLET(10 MG) BY MOUTH EVERY 6 HOURS AS NEEDED FOR NAUSEA OR VOMITING (Patient not taking: Reported on 08/13/2023) 60 tablet 2   rosuvastatin (CRESTOR) 10 MG tablet TAKE 1 TABLET AT BEDTIME 90 tablet 3   spironolactone (ALDACTONE) 25 MG tablet TAKE 1 TABLET(25 MG) BY MOUTH DAILY (Patient not taking: Reported on 08/13/2023) 90 tablet 2   valsartan (DIOVAN) 160 MG tablet Take 1 tablet (160 mg total) by mouth daily. 90 tablet 3   No current facility-administered medications for this visit.   Facility-Administered Medications Ordered in Other Visits  Medication Dose Route Frequency Provider Last Rate Last Admin   sodium chloride flush (NS) 0.9 % injection 10 mL  10 mL Intravenous PRN Rana Snare, NP   10 mL at 10/24/22 1311    VITAL SIGNS: LMP 09/17/2012  There were no vitals filed for  this visit.   Estimated body mass index is 26.87 kg/m as calculated from the following:   Height as of 08/13/23: 5' 8.5" (1.74 m).   Weight as of 08/13/23: 179 lb 4.8 oz (81.3 kg).  LABS: CBC:    Component Value Date/Time   WBC 4.8 08/13/2023 1055   WBC 5.9 01/27/2023 0833   HGB 9.1 (L) 08/13/2023 1055   HGB 11.8 07/31/2020 1113   HGB 11.6 07/15/2014 1048   HCT 28.5 (L) 08/13/2023 1055   HCT 36.8 07/31/2020 1113   HCT 35.8 07/15/2014 1048   PLT 404 (H) 08/13/2023 1055   PLT 340 07/31/2020 1113   MCV 98.3 08/13/2023 1055   MCV 95 07/31/2020 1113   MCV 88.6 07/15/2014 1048   NEUTROABS 3.4 08/13/2023 1055   NEUTROABS 2.4 07/15/2014 1048   LYMPHSABS 0.9 08/13/2023 1055   LYMPHSABS 2.4 07/15/2014 1048   MONOABS 0.4 08/13/2023 1055   MONOABS 0.3 07/15/2014 1048   EOSABS 0.1 08/13/2023 1055   EOSABS 0.2 07/15/2014 1048   BASOSABS 0.1 08/13/2023 1055   BASOSABS 0.0 07/15/2014 1048    Comprehensive Metabolic Panel:    Component Value Date/Time   NA 139 08/13/2023 1055   NA 141 08/29/2021 0000   NA 141 07/15/2014 1048   K 3.5 08/13/2023 1055   K 3.9 07/15/2014 1048   CL 108 08/13/2023 1055   CO2 26 08/13/2023 1055   CO2 27 07/15/2014 1048   BUN 16 08/13/2023 1055   BUN 6 08/29/2021 0000   BUN 16.6 07/15/2014 1048   CREATININE 1.10 (H) 08/13/2023 1055   CREATININE 0.9 07/15/2014 1048   GLUCOSE 145 (H) 08/13/2023 1055   GLUCOSE 169 (H) 07/15/2014 1048   CALCIUM 8.8 (L) 08/13/2023 1055   CALCIUM 9.2 07/15/2014 1048   AST 31 08/13/2023 1055   AST 16 07/15/2014 1048   ALT 28 08/13/2023 1055   ALT 15 07/15/2014 1048   ALKPHOS 193 (H) 08/13/2023 1055   ALKPHOS 62 07/15/2014 1048   BILITOT 0.4 08/13/2023 1055   BILITOT 0.42 07/15/2014 1048   PROT 6.6 08/13/2023 1055   PROT 7.1 07/31/2020 1113   PROT 7.4 07/15/2014 1048   ALBUMIN 3.5 08/13/2023 1055   ALBUMIN 4.1 07/31/2020 1113   ALBUMIN 3.7 07/15/2014 1048    RADIOGRAPHIC STUDIES:  PERFORMANCE STATUS (ECOG) : 1 - Symptomatic but completely ambulatory  Review of Systems  Constitutional:  Positive for activity change, appetite change and fatigue.  Unless otherwise noted, a complete review of systems is negative.   IMPRESSION: I connected by phone with [name] for follow-up. No acute distress identified. She states she is doing well. Much better compared to our initial meeting. Tolerating chemotherapy without difficulty. Denies concerns with nausea, vomiting. Some challenges at times with diarrhea alternating with constipation. Ongoing fatigue however some improvement. She shares appreciation that her appetite continues to be good and her weight has increased. Last weight of 179lbs which is up from 171lbs. She tries to remain as active as possible. We discussed her activity as she hopes to slowly re-introduce some form of exercise. She enjoys weight lifting with some cardio.   Alyssa Ross recently saw a  Homeopathic provider. No significant changes to daily lifestyle. We discussed massage therapy, nutrition, and other needs with a natural approach. We discussed her challenges with bowel regimen. She does have Miralax on hand in the home however not consistently taking. Education provided on introducing Miralax more consistently with an every other day schedule to allow  her to get a more consistent bowel pattern also allowing to adjust usage as needed.   Ongoing neuropathy in feet. She notices that around her treatment week she experiences more of a "flare" in symptoms however resolves within a week to a more manageable state. Not requiring medications at this time. Denies any significant pain or discomfort.   We discussed completion of her advanced directives. Ms. Cressey confirms she has an upcoming appointment at the end of March to complete her documents. Continues to work on getting other directives in place to address her financial and physical assets.   All questions answered and support provided. Patient knows I am available as needed.   I discussed the importance of continued conversation with family and their medical providers regarding overall plan of care and treatment options, ensuring decisions are within the context of the patients values and GOCs.  PLAN: Established therapeutic relationship. Education provided on palliative's role in collaboration with their Oncology/Radiation team.  Pancreatic Cancer Appetite is good. Tolerating treatments. Ongoing fatigue however improved. No pain reported. -Continue current treatment plan per Oncology.  -Patient has been actively involved with Dietician for appetite and nutrition management.  Chemotherapy-Induced Diarrhea/Constipation Patient experiences diarrhea post-treatment, managed with Imodium. However, this leads to constipation and discomfort. -Discussed use of Miralax every other day to allow for better bowel management. Allowing ability  to adjust as needed around treatment cycles. -Continue use of Imodium as needed.  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 Manageable. Flares during treatment cycle however discomfort will lessen afterwards. Not requiring medical intervention at this time.   Goals of Care/End of Life Care Planning Patient has been informed about the options for palliative care and hospice, and the process of transitioning between the two. -Continue to provide information and support as needed. -Confirmed appointment on March 28th with advanced directive clinic. -MOST form introduced, and education provided. Will complete at  at patient's convenience. -Extensive education provided on palliative and hospice's philosophy of care.    Follow-up in six weeks. Patient knows to contact office sooner if needed.   Patient expressed understanding and was in agreement with this plan. She also understands that She can call the clinic at any time with any questions, concerns, or complaints.   Thank you for your referral and allowing Palliative to assist in Alyssa Ross's care.   Visit consisted of counseling and education dealing with the complex and emotionally intense issues of symptom management and palliative care in the setting of serious and potentially life-threatening illness.  Signed by: Willette Alma, AGPCNP-BC Palliative Medicine Team/[facility]

## 2023-08-25 ENCOUNTER — Encounter: Payer: Self-pay | Admitting: Pharmacist

## 2023-08-25 ENCOUNTER — Inpatient Hospital Stay (HOSPITAL_BASED_OUTPATIENT_CLINIC_OR_DEPARTMENT_OTHER): Admitting: Nurse Practitioner

## 2023-08-25 ENCOUNTER — Other Ambulatory Visit: Payer: Self-pay | Admitting: Pharmacist

## 2023-08-25 ENCOUNTER — Encounter: Payer: Self-pay | Admitting: Nurse Practitioner

## 2023-08-25 DIAGNOSIS — R530 Neoplastic (malignant) related fatigue: Secondary | ICD-10-CM

## 2023-08-25 DIAGNOSIS — Z515 Encounter for palliative care: Secondary | ICD-10-CM

## 2023-08-25 DIAGNOSIS — M792 Neuralgia and neuritis, unspecified: Secondary | ICD-10-CM | POA: Diagnosis not present

## 2023-08-25 DIAGNOSIS — Z7189 Other specified counseling: Secondary | ICD-10-CM | POA: Diagnosis not present

## 2023-08-25 DIAGNOSIS — C7A8 Other malignant neuroendocrine tumors: Secondary | ICD-10-CM

## 2023-08-25 DIAGNOSIS — Z794 Long term (current) use of insulin: Secondary | ICD-10-CM

## 2023-08-25 NOTE — Progress Notes (Signed)
 08/25/2023 Name: Alyssa Ross MRN: [id number] DOB: 02-Oct-1965  Chief Complaint  Patient presents with   Medication Management    Diabetes    Alyssa Ross is a 58 y.o. year old female who presented for a telephone visit.   They were referred to the pharmacist by their PCP for assistance in managing diabetes. She has a medical history significant for pancreatic adenocarcinoma.    Subjective:  Care Team: Primary Care Provider: Dorothyann Peng, MD ; Next Scheduled Visit: 12/22/2023  Medication Access/Adherence  Current Pharmacy:  [id number] [place], [place] - [address] AT [facility] [address] [place] Phone: [phone] Fax: [phone]  [facility] - [place], [place] - [address] [place] [address] Phone: [phone] Fax: [phone]  [facility] [address] [place] [address] Phone: [phone] Fax: [phone]  [facility] - [place], [place] - [address] [facility] [address] [place] [address] Phone: [phone] Fax: [phone]  [facility] #[id number] - [place], [place] - [address] AT [facility] OF [address] [place] [address] Phone: [phone] Fax: [phone]   Patient reports affordability concerns with their medications: No  Patient reports access/transportation concerns to their pharmacy: No  Patient reports adherence concerns with their medications:  Yes      Diabetes:  Current medications:  Medications tried in the past:  [place] 17 units daily generally, 25 units around the time of steroids; Humalog per sliding scale - reports she was giving a 1:7 CHO ratio, generally giving ~4-10 with meals, up to 20 units with meals around the time of steroids.      Patient reports hypoglycemic s/sx including  dizziness, shakiness,  sweating. (In the morning)Patient denies hyperglycemic symptoms including  polyuria, polydipsia, polyphagia, nocturia, neuropathy, blurred vision.  Current meal patterns:  - Breakfast: bagel, [place] sausage, egg, sweet drink - Lunch meat, veggie, rice, fruit - Supper meat, veggie, rice, fruit, sweet drink - Snacks Reese cup - Drinks sweet tea, soda,   Current physical activity: not right now, can add walking, and weight lifting--has membership at a gym and at home  Patient not interested in using an insulin pump  Objective:  Lab Results  Component Value Date   HGBA1C 8.5 (H) 07/02/2023    Lab Results  Component Value Date   CREATININE 1.10 (H) 08/13/2023   BUN 16 08/13/2023   NA 139 08/13/2023   K 3.5 08/13/2023   CL 108 08/13/2023   CO2 26 08/13/2023    Lab Results  Component Value Date   CHOL 120 12/17/2022   HDL 44 12/17/2022   LDLCALC 62 12/17/2022   LDLDIRECT 97.7 02/11/2014   TRIG 68 12/17/2022   CHOLHDL 2.7 12/17/2022    Medications Reviewed Today   Medications were not reviewed in this encounter       Assessment/Plan:   Diabetes: - Currently uncontrolled last A1c 8.5% - Reviewed long term cardiovascular and renal outcomes of uncontrolled blood sugar - Reviewed goal A1c, goal fasting, and goal 2 hour post prandial glucose - Reviewed dietary modifications including decreasing simple carbohydrates - Reviewed lifestyle modifications including:  - Recommend to Be consistent with dosing Humalog (She admitted to not always dosing as she should) -Keep food log -Exercise at least once. -Asked Patient to send her Endocrinologist a MyChart message about  her rising blood sugars.  Hypertension: - Currently controlled 125/87 - Recommend to Continue Current therapy   Hyperlipidemia/ASCVD Risk Reduction: - Currently controlled.  - Recommend to continue current therapy   Follow Up Plan:    Call patient in 1 week.   Beecher Mcardle, PharmD, BCACP Clinical  Pharmacist [phone]

## 2023-08-27 ENCOUNTER — Inpatient Hospital Stay: Payer: BC Managed Care – PPO

## 2023-08-27 ENCOUNTER — Encounter: Payer: Self-pay | Admitting: Nurse Practitioner

## 2023-08-27 ENCOUNTER — Inpatient Hospital Stay (HOSPITAL_BASED_OUTPATIENT_CLINIC_OR_DEPARTMENT_OTHER): Payer: BC Managed Care – PPO | Admitting: Nurse Practitioner

## 2023-08-27 VITALS — BP 134/77 | HR 80 | Temp 98.2°F | Resp 18 | Ht 68.5 in | Wt 180.0 lb

## 2023-08-27 DIAGNOSIS — C250 Malignant neoplasm of head of pancreas: Secondary | ICD-10-CM

## 2023-08-27 DIAGNOSIS — I10 Essential (primary) hypertension: Secondary | ICD-10-CM | POA: Diagnosis not present

## 2023-08-27 DIAGNOSIS — C259 Malignant neoplasm of pancreas, unspecified: Secondary | ICD-10-CM | POA: Diagnosis not present

## 2023-08-27 DIAGNOSIS — Z79899 Other long term (current) drug therapy: Secondary | ICD-10-CM | POA: Diagnosis not present

## 2023-08-27 DIAGNOSIS — C7801 Secondary malignant neoplasm of right lung: Secondary | ICD-10-CM | POA: Diagnosis not present

## 2023-08-27 DIAGNOSIS — Z905 Acquired absence of kidney: Secondary | ICD-10-CM | POA: Diagnosis not present

## 2023-08-27 DIAGNOSIS — Z5111 Encounter for antineoplastic chemotherapy: Secondary | ICD-10-CM | POA: Diagnosis not present

## 2023-08-27 DIAGNOSIS — C7802 Secondary malignant neoplasm of left lung: Secondary | ICD-10-CM | POA: Diagnosis not present

## 2023-08-27 DIAGNOSIS — C3411 Malignant neoplasm of upper lobe, right bronchus or lung: Secondary | ICD-10-CM | POA: Diagnosis not present

## 2023-08-27 LAB — CMP (CANCER CENTER ONLY)
ALT: 53 U/L — ABNORMAL HIGH (ref 0–44)
AST: 50 U/L — ABNORMAL HIGH (ref 15–41)
Albumin: 3.4 g/dL — ABNORMAL LOW (ref 3.5–5.0)
Alkaline Phosphatase: 235 U/L — ABNORMAL HIGH (ref 38–126)
Anion gap: 6 (ref 5–15)
BUN: 14 mg/dL (ref 6–20)
CO2: 23 mmol/L (ref 22–32)
Calcium: 8.2 mg/dL — ABNORMAL LOW (ref 8.9–10.3)
Chloride: 111 mmol/L (ref 98–111)
Creatinine: 0.81 mg/dL (ref 0.44–1.00)
GFR, Estimated: >60 mL/min (ref >60)
Glucose, Bld: 242 mg/dL — ABNORMAL HIGH (ref 70–99)
Potassium: 3 mmol/L — ABNORMAL LOW (ref 3.5–5.1)
Sodium: 140 mmol/L (ref 135–145)
Total Bilirubin: 0.6 mg/dL (ref 0.0–1.2)
Total Protein: 6.2 g/dL — ABNORMAL LOW (ref 6.5–8.1)

## 2023-08-27 LAB — CBC WITH DIFFERENTIAL (CANCER CENTER ONLY)
Abs Immature Granulocytes: 0.01 10*3/uL (ref 0.00–0.07)
Basophils Absolute: 0 10*3/uL (ref 0.0–0.1)
Basophils Relative: 1 %
Eosinophils Absolute: 0.1 10*3/uL (ref 0.0–0.5)
Eosinophils Relative: 3 %
HCT: 27.8 % — ABNORMAL LOW (ref 36.0–46.0)
Hemoglobin: 9 g/dL — ABNORMAL LOW (ref 12.0–15.0)
Immature Granulocytes: 0 %
Lymphocytes Relative: 18 %
Lymphs Abs: 0.7 10*3/uL (ref 0.7–4.0)
MCH: 31.9 pg (ref 26.0–34.0)
MCHC: 32.4 g/dL (ref 30.0–36.0)
MCV: 98.6 fL (ref 80.0–100.0)
Monocytes Absolute: 0.3 10*3/uL (ref 0.1–1.0)
Monocytes Relative: 9 %
Neutro Abs: 2.8 10*3/uL (ref 1.7–7.7)
Neutrophils Relative %: 69 %
Platelet Count: 307 10*3/uL (ref 150–400)
RBC: 2.82 MIL/uL — ABNORMAL LOW (ref 3.87–5.11)
RDW: 16.8 % — ABNORMAL HIGH (ref 11.5–15.5)
WBC Count: 4 10*3/uL (ref 4.0–10.5)
nRBC: 0 % (ref 0.0–0.2)

## 2023-08-27 LAB — MAGNESIUM: Magnesium: 1.7 mg/dL (ref 1.7–2.4)

## 2023-08-27 MED ORDER — DEXAMETHASONE SODIUM PHOSPHATE 10 MG/ML IJ SOLN
10.0000 mg | Freq: Once | INTRAMUSCULAR | Status: AC
  Administered 2023-08-27: 10 mg via INTRAVENOUS
  Filled 2023-08-27: qty 1

## 2023-08-27 MED ORDER — PALONOSETRON HCL INJECTION 0.25 MG/5ML
0.2500 mg | Freq: Once | INTRAVENOUS | Status: AC
  Administered 2023-08-27: 0.25 mg via INTRAVENOUS
  Filled 2023-08-27: qty 5

## 2023-08-27 MED ORDER — SODIUM CHLORIDE 0.9 % IV SOLN
2400.0000 mg/m2 | INTRAVENOUS | Status: DC
  Administered 2023-08-27: 5000 mg via INTRAVENOUS
  Filled 2023-08-27: qty 100

## 2023-08-27 MED ORDER — SODIUM CHLORIDE 0.9 % IV SOLN
55.0000 mg/m2 | Freq: Once | INTRAVENOUS | Status: AC
  Administered 2023-08-27: 107.5 mg via INTRAVENOUS
  Filled 2023-08-27: qty 25

## 2023-08-27 MED ORDER — ATROPINE SULFATE 1 MG/ML IV SOLN
0.5000 mg | Freq: Once | INTRAVENOUS | Status: AC | PRN
  Administered 2023-08-27: 0.5 mg via INTRAVENOUS
  Filled 2023-08-27: qty 1

## 2023-08-27 MED ORDER — SODIUM CHLORIDE 0.9 % IV SOLN: INTRAVENOUS | Status: DC

## 2023-08-27 MED ORDER — SODIUM CHLORIDE 0.9 % IV SOLN
400.0000 mg/m2 | Freq: Once | INTRAVENOUS | Status: AC
  Administered 2023-08-27: 776 mg via INTRAVENOUS
  Filled 2023-08-27: qty 38.8

## 2023-08-27 NOTE — Progress Notes (Signed)
 Patient seen by Lonna Cobb NP today  Vitals are within treatment parameters:Yes   Labs are within treatment parameters: No (Please specify and give further instructions.)  K+ 3.0--OK to proceed. She agreed to take her K+ tid regularly   Treatment plan has been signed: Yes   Per physician team, Patient is ready for treatment and there are NO modifications to the treatment plan.

## 2023-08-27 NOTE — Progress Notes (Signed)
 [facility] OFFICE PROGRESS NOTE   Diagnosis: Pancreas cancer  INTERVAL HISTORY:   [name] returns as scheduled.  She completed cycle 6 FOLFIRI with liposomal irinotecan 08/13/2023.  She denies nausea/vomiting.  No mouth sores.  No diarrhea.  She tends to develop constipation after treatment.  She denies pain.  She has a good appetite.  Objective:  Vital signs in last 24 hours:  Blood pressure 134/77, pulse 80, temperature 98.2 F (36.8 C), temperature source Temporal, resp. rate 18, height 5' 8.5" (1.74 m), weight 180 lb (81.6 kg), last menstrual period 09/17/2012, SpO2 100%.    HEENT: No thrush or ulcers. Resp: Lungs clear bilaterally. Cardio: Regular rate and rhythm. GI: Abdomen soft and nontender.  No hepatosplenomegaly. Vascular: No leg edema. Port-A-Cath without erythema.  Lab Results:  Lab Results  Component Value Date   WBC 4.8 08/13/2023   HGB 9.1 (L) 08/13/2023   HCT 28.5 (L) 08/13/2023   MCV 98.3 08/13/2023   PLT 404 (H) 08/13/2023   NEUTROABS 3.4 08/13/2023    Imaging:  No results found.  Medications: I have reviewed the patient's current medications.  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 Tumor invades the duodenal wall and vascular groove, resection margins negative, 4/34 lymph nodes positive MSI-stable, tumor showed instability in 2 loci as did adjacent normal tissue Foundation 1-K-ras G12 V, microsatellite status and tumor mutation burden cannot be determined EUS FNA biopsy of pancreas mass on 07/03/2018-well-differentiated neuroendocrine tumor CTs 01/29/2019-ill-defined pancreas head mass, 5 pulmonary nodules-1 with a small amount of central cavitation, tumor abuts the left margin of the portal vein indistinct density surrounding, hepatic artery, complex cystic lesion of the right kidney, right adrenal mass-characterized as an adenoma on a [facility] MRI 12/21/2018 [facility]  03/03/2019-no focal pancreas activity, no tracer accumulation within the suspicious pulmonary nodules, left uterine mass with tracer accumulation felt to represent a leiomyoma Elevated preoperative CA 19-9--CA 19-9 186 on 01/18/2019 CT chest 04/16/2019-multiple bilateral pulmonary nodules, some with increased cavitation, stable in size Cycle 1 FOLFIRINOX 04/27/2019 Cycle 2 FOLFIRINOX 05/11/2019 Cycle 3 FOLFIRINOX 05/23/2019 Cycle 4 FOLFIRINOX 06/08/2019 Cycle 5 FOLFIRINOX 06/22/2019 CT chest 07/02/2019-stable size of bilateral pulmonary nodules.  Dominant cavitary lesions in both lungs show increased cavitation with thinner walls.  Stable 2.1 cm right adrenal nodule. 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 No definite evidence of metastatic disease in the abdomen or pelvis. Markedly enlarged and heterogeneous appearing uterus, likely to represent multifocal fibroids. 1 of these lesions appears to be an exophytic subserosal fibroid in the posterior lateral aspect of the uterine body on the left side although this comes in close proximity to the left adnexa such that a primary ovarian lesion is difficult to completely exclude. CTs 02/07/2020-slight enlargement of bilateral lung nodules, some are cavitary, no evidence of metastatic disease in the abdomen or pelvis, stable right adrenal nodule, uterine fibroids CTs 04/26/2020-mild enlargement of pulmonary nodules, slight increase in trace pelvic fluid, new soft tissue thickening inferior to the cecal tip suspicious for peritoneal metastasis CT  05/26/2020-improved appearance of soft tissue at the inferior tip of the cecum, mildly thickened short appendix-findings suggestive of resolving appendicitis,  stable small bibasilar pulmonary nodules, fibroids Plan biopsy of right cecal tip soft tissue canceled secondary to radiologic improvement CT chest 08/02/2020-enlargement and progressive cavitation of multiple bilateral lung nodules.  Some new nodules are present. CTs 10/24/2020- increase in size of pulmonary nodules, no new nodules, no evidence of metastatic disease in the abdomen, stable right adrenal nodule CT 01/09/2021-slight interval enlargement of pulmonary nodules, stable right adrenal nodule Navigation bronchoscopy 01/30/2021-left lower lobe cavitary nodule FNA-adenocarcinoma, brushing-adenocarcinoma.  Left lower lobe lavage-adenocarcinoma.  Right upper lobe brushing and FNA biopsy of cavitary nodule-adenocarcinoma-immunohistochemical profile consistent with pancreas adenocarcinoma Cycle 1 gemcitabine/Abraxane 03/28/2021 Cycle 2 gemcitabine/Abraxane 04/11/2021 Cycle 3 gemcitabine/Abraxane 04/25/2021 Cycle 4 gemcitabine/Abraxane 05/09/2021 Cycle 5 gemcitabine/Abraxane 05/23/2021 CT chest 06/05/2021-interval cavitation of multiple pulmonary nodules, some nodules have decreased in size, no new or enlarging nodules Cycle 6 gemcitabine/Abraxane 06/06/2021 Cycle 7 gemcitabine/Abraxane 06/21/2021 Cycle 8 gemcitabine/Abraxane 07/05/2021 Cycle 9 Gemcitabine/Abraxane 07/19/2021 Cycle 10 gemcitabine 08/01/2021-Abraxane held secondary to neuropathy CT chest 08/13/2021-mild decrease in size and wall thickness of multiple cavitary nodules, no new or progressive disease in the chest, indeterminate low-attenuation right liver lesions Cycle 11 gemcitabine 08/16/2021-Abraxane held secondary to neuropathy Cycle 12 gemcitabine 08/30/2021-Abraxane held secondary to neuropathy Cycle 13 gemcitabine 09/13/2021-Abraxane held secondary to neuropathy Cycle 14 gemcitabine  09/27/2021-Abraxane held secondary to neuropathy Cycle 15 gemcitabine 10/11/2021-Abraxane held secondary to neuropathy CTs 10/22/2021-no change in multiple cavitary lung nodules, no evidence of disease progression, ill-defined hypodense lesion in the posterior right liver suspicious for metastatic disease Cycle 16 gemcitabine 10/25/2021 Cycle 17 gemcitabine 11/08/2021 Cycle 18 Gemcitabine 11/22/2021 Cycle 19 gemcitabine 12/06/2021 Cycle 20 Gemcitabine 12/20/2021 CT 12/31/2021-mild increase in size of bilateral pulmonary metastases, stable subtle continuation right liver lesions Cycle 20 gemcitabine/Abraxane 01/03/2022 Cycle 21 gemcitabine/Abraxane 01/17/2022 Cycle 22 gemcitabine/Abraxane 01/31/2022 Cycle 23 gemcitabine/Abraxane 02/14/2022 Cycle 24 gemcitabine/Abraxane 02/28/2022 CTs 03/11/2022-widespread metastatic disease to the lungs again noted with slight involution of several of the pulmonary nodules, no definite new nodules noted; interval cavitation of solid lesion in the posterior aspect right lobe of the liver, no new liver lesions noted. Cycle 25 Gemcitabine/Abraxane 03/14/2022 Cycle 26 gemcitabine/Abraxane 03/28/2022 Cycle 27 gemcitabine/Abraxane 04/25/2022 Cycle 28 gemcitabine/Abraxane 05/09/2022 Cycle 29 Gemcitabine 05/23/2022, Abraxane held due to neuropathy CTs 06/04/2022-stable multifocal cavitary pulmonary nodules.  No definite new nodules.  No new nodules greater than a centimeter.  Decreased size of the right posterior hepatic lobe lesion.  Generalized heterogeneity and new focal areas of hypodensity in the liver. Cycle 30 Gemcitabine 06/06/2022, Abraxane held due to neuropathy Cycle 31 gemcitabine 06/20/2022, Abraxane held due to neuropathy Cycle 32 gemcitabine 07/04/2022, Abraxane held due to neuropathy Cycle 33 gemcitabine  07/18/2022 ,Abraxane held due to neuropathy Cycle 34 gemcitabine 08/15/2022, Abraxane held due to neuropathy Cycle 35 gemcitabine 08/29/2022, Abraxane held due to  neuropathy CTs 09/10/2022-enlarging pulmonary metastases.  Subtle poorly defined hypoattenuating lesions in the liver, less well-seen than on 06/04/2022.  Suspected new lesion in the dome of the right hepatic lobe Cycle 36 gemcitabine/Abraxane 09/12/2022 Cycle 37 gemcitabine/Abraxane 09/26/2022 Cycle 38 gemcitabine/Abraxane 10/10/2022 Cycle 39 gemcitabine/Abraxane 11/07/2022 Cycle 40 gemcitabine/Abraxane 11/21/2022 Cycle 41 gemcitabine/Abraxane 12/05/2022 Cycle 42 gemcitabine/Abraxane 12/19/2022 CT is 12/30/2022-new left hepatic lesion, stable bilateral pulmonary metastases Cycle 43 gemcitabine/Abraxane 01/02/2023 CT-guided biopsy of liver lesion 01/27/2023-acute/chronic inflammation, negative for malignancy, culture negative Cycle 44 gemcitabine/Abraxane 01/30/2023 Cycle 45 gemcitabine/Abraxane 02/13/2023 Cycle 46 gemcitabine/Abraxane 02/27/2023 Cycle 47 gemcitabine/Abraxane 03/13/2023 Cycle 48 gemcitabine/Abraxane 03/27/2023 CTs 04/21/2023-numerous solid and cavitary lung nodules overall similar, some slightly enlarged; multiple new and enlarging rim-enhancing liver lesions. Cycle 1 FOLFIRI  with liposomal irinotecan 05/21/2023 Cycle 2 held 06/05/2023 due to neutropenia Cycle 2 FOLFIRI with liposomal irinotecan 06/18/2023, irinotecan dose reduced (white cell growth factor declined per patient) Cycle 3 FOLFIRI with liposomal irinotecan 07/02/2023 Cycle 4 FOLFIRI with liposomal irinotecan 07/16/2023 Cycle 5 FOLFIRI with liposomal irinotecan 07/30/2023 Cycle 6 FOLFIRI with liposomal irinotecan 08/13/2023 CTs 08/20/2023-numerous cavitary lesions seen in the lungs, extent and distribution appear similar.  A few lesions are slightly smaller.  Numerous liver lesions are now smaller.  Stable right adrenal nodule, adenoma favored. 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  Apixaban 10/24/2022 Port-A-Cath malfunction 08/01/2023.  Port-A-Cath removed and replaced 08/08/2023.    Disposition: Alyssa Ross appears stable.  She has completed 6 cycles of FOLFIRI with liposomal irinotecan.  She is tolerating treatment well.  She has a good performance status.  Most recent CA 19-9 improved.  Recent restaging CTs show improvement.  Plan to proceed with cycle 7 FOLFIRI today as scheduled.  She agrees with this plan.  CBC and chemistry panel reviewed.  Labs adequate to proceed as above.  She has hypokalemia.  She has not been taking the potassium supplement consistently as prescribed.  She will resume 1 tablet 3 times daily.  Magnesium level is normal.  She will return for follow-up and treatment in 2 weeks.  Lonna Cobb ANP/GNP-BC   08/27/2023  10:11 AM

## 2023-08-27 NOTE — Patient Instructions (Addendum)
 [facility]   Discharge Instructions: The chemotherapy medication bag should finish at 46 hours, 96 hours, or 7 days. For example, if your pump is scheduled for 46 hours and it was put on at 4:00 p.m., it should finish at 2:00 p.m. the day it is scheduled to come off regardless of your appointment time.     Estimated time to finish at 1:15 FRIDAY, August 29, 2023.   If the display on your pump reads "Low Volume" and it is beeping, take the batteries out of the pump and come to the cancer center for it to be taken off.   If the pump alarms go off prior to the pump reading "Low Volume" then call [phone] and someone can assist you.  If the plunger comes out and the chemotherapy medication is leaking out, please use your home chemo spill kit to clean up the spill. Do NOT use paper towels or other household products.  If you have problems or questions regarding your pump, please call either [phone] (24 hours a day) or the cancer center Monday-Friday 8:00 a.m.- 4:30 p.m. at the clinic number and we will assist you. If you are unable to get assistance, then go to the nearest Emergency Department and ask the staff to contact the IV team for assistance.   Thank you for choosing [facility] to provide your oncology and hematology care.   If you have a lab appointment with the Cancer Center, please go directly to the Cancer Center and check in at the registration area.   Wear comfortable clothing and clothing appropriate for easy access to any Portacath or PICC line.   We strive to give you quality time with your provider. You may need to reschedule your appointment if you arrive late (15 or more minutes).  Arriving late affects you and other patients whose appointments are after yours.  Also, if you miss three or more appointments without notifying the office, you may be dismissed from the clinic at the provider's  discretion.      For prescription refill requests, have your pharmacy contact our office and allow 72 hours for refills to be completed.    Today you received the following chemotherapy and/or immunotherapy agents IRINOTECAN LIPOSOME, LEUCOVORIN, FLUOROURACIL       To help prevent nausea and vomiting after your treatment, we encourage you to take your nausea medication as directed.  BELOW ARE SYMPTOMS THAT SHOULD BE REPORTED IMMEDIATELY: *FEVER GREATER THAN 100.4 F (38 C) OR HIGHER *CHILLS OR SWEATING *NAUSEA AND VOMITING THAT IS NOT CONTROLLED WITH YOUR NAUSEA MEDICATION *UNUSUAL SHORTNESS OF BREATH *UNUSUAL BRUISING OR BLEEDING *URINARY PROBLEMS (pain or burning when urinating, or frequent urination) *BOWEL PROBLEMS (unusual diarrhea, constipation, pain near the anus) TENDERNESS IN MOUTH AND THROAT WITH OR WITHOUT PRESENCE OF ULCERS (sore throat, sores in mouth, or a toothache) UNUSUAL RASH, SWELLING OR PAIN  UNUSUAL VAGINAL DISCHARGE OR ITCHING   Items with * indicate a potential emergency and should be followed up as soon as possible or go to the Emergency Department if any problems should occur.  Please show the CHEMOTHERAPY ALERT CARD or IMMUNOTHERAPY ALERT CARD at check-in to the Emergency Department and triage nurse.  Should you have questions after your visit or need to cancel or reschedule your appointment, please contact [facility]  Dept: [phone]  and follow the  prompts.  Office hours are 8:00 a.m. to 4:30 p.m. Monday - Friday. Please note that voicemails left after 4:00 p.m. may not be returned until the following business day.  We are closed weekends and major holidays. You have access to a nurse at all times for urgent questions. Please call the main number to the clinic Dept: [phone] and follow the prompts.   For any non-urgent questions, you may also contact your provider using MyChart. We now offer  e-Visits for anyone 58 and older to request care online for non-urgent symptoms. For details visit [email or web address].   Also download the MyChart app! Go to the app store, search "MyChart", open the app, select [facility], and log in with your MyChart username and password.  Irinotecan Liposome Injection What is this medication? IRINOTECAN LIPOSOME (eye ri noe TEE kan LIP oh som) treats pancreatic cancer. It works by slowing down the growth of cancer cells. This medicine may be used for other purposes; ask your health care provider or pharmacist if you have questions. COMMON BRAND NAME(S): ONIVYDE What should I tell my care team before I take this medication? They need to know if you have any of these conditions: Blockage in your bowels Dehydration Infection Low white blood cell levels Lung disease An unusual or allergic reaction to irinotecan liposome, irinotecan, other medications, foods, dyes, or preservatives Pregnant or trying to get pregnant Breast-feeding How should I use this medication? This medication is injected into a vein. It is given by your care team in a hospital or clinic setting. Talk to your care team about the use of this medication in children. Special care may be needed. Overdosage: If you think you have taken too much of this medicine contact a poison control center or emergency room at once. NOTE: This medicine is only for you. Do not share this medicine with others. What if I miss a dose? Keep appointments for follow-up doses. It is important not to miss your dose. Call your care team if you are unable to keep an appointment. What may interact with this medication? Do not take this medication with any of the following: Itraconazole This medication may also interact with the following: Certain antivirals for HIV or AIDS Certain medications for seizures, such as carbamazepine, fosphenytoin, phenytoin,  phenobarbital Clarithromycin Gemfibrozil Nefazodone Rifabutin Rifampin Rifapentine St. John's Wort Voriconazole This list may not describe all possible interactions. Give your health care provider a list of all the medicines, herbs, non-prescription drugs, or dietary supplements you use. Also tell them if you smoke, drink alcohol, or use illegal drugs. Some items may interact with your medicine. What should I watch for while using this medication? This medication may make you feel generally unwell. This is not uncommon as chemotherapy can affect healthy cells as well as cancer cells. Report any side effects. Continue your course of treatment even though you feel ill unless your care team tells you to stop. You may need blood work while you are taking this medication. This medication can cause serious side effects and allergic reactions. To reduce your risk, your care team may give you other medications to take before receiving this one. Be sure to follow the directions from your care team. Check with your care team if you get an attack of diarrhea, nausea and vomiting, or if you sweat a lot. The loss of too much body fluid can make it dangerous for you to take this medication. This medication may cause infertility. Talk to your care team  if you are concerned about your fertility. Talk to your care team if you wish to become pregnant or if you think you might be pregnant. This medication can cause serious birth defects if taken during pregnancy or if you get pregnant within 7 months after stopping therapy. A negative pregnancy test is required before starting this medication. A reliable form of contraception is recommended while taking this medication and for 7 months after stopping it. Talk to your care team about reliable forms of contraception. Use a condom during sex and for 4 months after stopping therapy. Tell your care team right away if you think your partner might be pregnant. This  medication can cause serious birth defects. Do not breast-feed while taking this medication and for 1 month after stopping therapy. This medication may increase your risk of getting an infection. Call your care team for advice if you get a fever, chills, sore throat, or other symptoms of a cold or flu. Do not treat yourself. Try to avoid being around people who are sick. Avoid taking medications that contain aspirin, acetaminophen, ibuprofen, naproxen, or ketoprofen unless instructed by your care team. These medications may hide a fever. Be careful brushing or flossing your teeth or using a toothpick because you may get an infection or bleed more easily. If you have any dental work done, tell your dentist you are receiving this medication. What side effects may I notice from receiving this medication? Side effects that you should report to your care team as soon as possible: Allergic reactions or angioedema--skin rash, itching or hives, swelling of the face, eyes, lips, tongue, arms, or legs, trouble swallowing or breathing Dry cough, shortness of breath or trouble breathing Diarrhea Infection--fever, chills, cough, or sore throat Side effects that usually do not require medical attention (report to your care team if they continue or are bothersome): Fatigue Loss of appetite Nausea Pain, redness, or swelling with sores inside the mouth or throat Vomiting This list may not describe all possible side effects. Call your doctor for medical advice about side effects. You may report side effects to FDA at 1-800-FDA-1088. Where should I keep my medication? This medication is given in a hospital or clinic. It will not be stored at home. NOTE: This sheet is a summary. It may not cover all possible information. If you have questions about this medicine, talk to your doctor, pharmacist, or health care provider.  2024 Elsevier/Gold Standard (2021-07-26 00:00:00)  Leucovorin Injection What is this  medication? LEUCOVORIN (loo koe VOR in) prevents side effects from certain medications, such as methotrexate. It works by increasing folate levels. This helps protect healthy cells in your body. It may also be used to treat anemia caused by low levels of folate. It can also be used with fluorouracil, a type of chemotherapy, to treat colorectal cancer. It works by increasing the effects of fluorouracil in the body. This medicine may be used for other purposes; ask your health care provider or pharmacist if you have questions. What should I tell my care team before I take this medication? They need to know if you have any of these conditions: Anemia from low levels of vitamin B12 in the blood An unusual or allergic reaction to leucovorin, folic acid, other medications, foods, dyes, or preservatives Pregnant or trying to get pregnant Breastfeeding How should I use this medication? This medication is injected into a vein or a muscle. It is given by your care team in a hospital or clinic setting. Talk  to your care team about the use of this medication in children. Special care may be needed. Overdosage: If you think you have taken too much of this medicine contact a poison control center or emergency room at once. NOTE: This medicine is only for you. Do not share this medicine with others. What if I miss a dose? Keep appointments for follow-up doses. It is important not to miss your dose. Call your care team if you are unable to keep an appointment. What may interact with this medication? Capecitabine Fluorouracil Phenobarbital Phenytoin Primidone Trimethoprim;sulfamethoxazole This list may not describe all possible interactions. Give your health care provider a list of all the medicines, herbs, non-prescription drugs, or dietary supplements you use. Also tell them if you smoke, drink alcohol, or use illegal drugs. Some items may interact with your medicine. What should I watch for while using  this medication? Your condition will be monitored carefully while you are receiving this medication. This medication may increase the side effects of 5-fluorouracil. Tell your care team if you have diarrhea or mouth sores that do not get better or that get worse. What side effects may I notice from receiving this medication? Side effects that you should report to your care team as soon as possible: Allergic reactions--skin rash, itching, hives, swelling of the face, lips, tongue, or throat This list may not describe all possible side effects. Call your doctor for medical advice about side effects. You may report side effects to FDA at 1-800-FDA-1088. Where should I keep my medication? This medication is given in a hospital or clinic. It will not be stored at home. NOTE: This sheet is a summary. It may not cover all possible information. If you have questions about this medicine, talk to your doctor, pharmacist, or health care provider.  2024 Elsevier/Gold Standard (2021-10-30 00:00:00  Fluorouracil Injection What is this medication? FLUOROURACIL (flure oh YOOR a sil) treats some types of cancer. It works by slowing down the growth of cancer cells. This medicine may be used for other purposes; ask your health care provider or pharmacist if you have questions. COMMON BRAND NAME(S): Adrucil What should I tell my care team before I take this medication? They need to know if you have any of these conditions: Blood disorders Dihydropyrimidine dehydrogenase (DPD) deficiency Infection, such as chickenpox, cold sores, herpes Kidney disease Liver disease Poor nutrition Recent or ongoing radiation therapy An unusual or allergic reaction to fluorouracil, other medications, foods, dyes, or preservatives If you or your partner are pregnant or trying to get pregnant Breast-feeding How should I use this medication? This medication is injected into a vein. It is administered by your care team in a  hospital or clinic setting. Talk to your care team about the use of this medication in children. Special care may be needed. Overdosage: If you think you have taken too much of this medicine contact a poison control center or emergency room at once. NOTE: This medicine is only for you. Do not share this medicine with others. What if I miss a dose? Keep appointments for follow-up doses. It is important not to miss your dose. Call your care team if you are unable to keep an appointment. What may interact with this medication? Do not take this medication with any of the following: Live virus vaccines This medication may also interact with the following: Medications that treat or prevent blood clots, such as warfarin, enoxaparin, dalteparin This list may not describe all possible interactions. Give your health  care provider a list of all the medicines, herbs, non-prescription drugs, or dietary supplements you use. Also tell them if you smoke, drink alcohol, or use illegal drugs. Some items may interact with your medicine. What should I watch for while using this medication? Your condition will be monitored carefully while you are receiving this medication. This medication may make you feel generally unwell. This is not uncommon as chemotherapy can affect healthy cells as well as cancer cells. Report any side effects. Continue your course of treatment even though you feel ill unless your care team tells you to stop. In some cases, you may be given additional medications to help with side effects. Follow all directions for their use. This medication may increase your risk of getting an infection. Call your care team for advice if you get a fever, chills, sore throat, or other symptoms of a cold or flu. Do not treat yourself. Try to avoid being around people who are sick. This medication may increase your risk to bruise or bleed. Call your care team if you notice any unusual bleeding. Be careful brushing  or flossing your teeth or using a toothpick because you may get an infection or bleed more easily. If you have any dental work done, tell your dentist you are receiving this medication. Avoid taking medications that contain aspirin, acetaminophen, ibuprofen, naproxen, or ketoprofen unless instructed by your care team. These medications may hide a fever. Do not treat diarrhea with over the counter products. Contact your care team if you have diarrhea that lasts more than 2 days or if it is severe and watery. This medication can make you more sensitive to the sun. Keep out of the sun. If you cannot avoid being in the sun, wear protective clothing and sunscreen. Do not use sun lamps, tanning beds, or tanning booths. Talk to your care team if you or your partner wish to become pregnant or think you might be pregnant. This medication can cause serious birth defects if taken during pregnancy and for 3 months after the last dose. A reliable form of contraception is recommended while taking this medication and for 3 months after the last dose. Talk to your care team about effective forms of contraception. Do not father a child while taking this medication and for 3 months after the last dose. Use a condom while having sex during this time period. Do not breastfeed while taking this medication. This medication may cause infertility. Talk to your care team if you are concerned about your fertility. What side effects may I notice from receiving this medication? Side effects that you should report to your care team as soon as possible: Allergic reactions--skin rash, itching, hives, swelling of the face, lips, tongue, or throat Heart attack--pain or tightness in the chest, shoulders, arms, or jaw, nausea, shortness of breath, cold or clammy skin, feeling faint or lightheaded Heart failure--shortness of breath, swelling of the ankles, feet, or hands, sudden weight gain, unusual weakness or fatigue Heart rhythm  changes--fast or irregular heartbeat, dizziness, feeling faint or lightheaded, chest pain, trouble breathing High ammonia level--unusual weakness or fatigue, confusion, loss of appetite, nausea, vomiting, seizures Infection--fever, chills, cough, sore throat, wounds that don't heal, pain or trouble when passing urine, general feeling of discomfort or being unwell Low red blood cell level--unusual weakness or fatigue, dizziness, headache, trouble breathing Pain, tingling, or numbness in the hands or feet, muscle weakness, change in vision, confusion or trouble speaking, loss of balance or coordination, trouble walking, seizures  Redness, swelling, and blistering of the skin over hands and feet Severe or prolonged diarrhea Unusual bruising or bleeding Side effects that usually do not require medical attention (report to your care team if they continue or are bothersome): Dry skin Headache Increased tears Nausea Pain, redness, or swelling with sores inside the mouth or throat Sensitivity to light Vomiting This list may not describe all possible side effects. Call your doctor for medical advice about side effects. You may report side effects to FDA at 1-800-FDA-1088. Where should I keep my medication? This medication is given in a hospital or clinic. It will not be stored at home. NOTE: This sheet is a summary. It may not cover all possible information. If you have questions about this medicine, talk to your doctor, pharmacist, or health care provider.  2024 Elsevier/Gold Standard (2021-10-02 00:00:00)

## 2023-08-28 ENCOUNTER — Encounter: Payer: Self-pay | Admitting: Nurse Practitioner

## 2023-08-28 ENCOUNTER — Telehealth: Payer: Self-pay

## 2023-08-28 LAB — CANCER ANTIGEN 19-9: CA 19-9: 323 U/mL — ABNORMAL HIGH (ref 0–35)

## 2023-08-28 NOTE — Telephone Encounter (Signed)
 Patient was identified as falling into the [place] Measure - Diabetes.   Patient was: Appointment scheduled for lab or office visit for A1c.

## 2023-08-29 ENCOUNTER — Inpatient Hospital Stay: Payer: BC Managed Care – PPO

## 2023-08-29 VITALS — BP 133/73 | Temp 98.0°F | Resp 18

## 2023-08-29 DIAGNOSIS — Z905 Acquired absence of kidney: Secondary | ICD-10-CM | POA: Diagnosis not present

## 2023-08-29 DIAGNOSIS — Z79899 Other long term (current) drug therapy: Secondary | ICD-10-CM | POA: Diagnosis not present

## 2023-08-29 DIAGNOSIS — C7802 Secondary malignant neoplasm of left lung: Secondary | ICD-10-CM | POA: Diagnosis not present

## 2023-08-29 DIAGNOSIS — C7801 Secondary malignant neoplasm of right lung: Secondary | ICD-10-CM | POA: Diagnosis not present

## 2023-08-29 DIAGNOSIS — I10 Essential (primary) hypertension: Secondary | ICD-10-CM | POA: Diagnosis not present

## 2023-08-29 DIAGNOSIS — C3411 Malignant neoplasm of upper lobe, right bronchus or lung: Secondary | ICD-10-CM | POA: Diagnosis not present

## 2023-08-29 DIAGNOSIS — C250 Malignant neoplasm of head of pancreas: Secondary | ICD-10-CM | POA: Diagnosis not present

## 2023-08-29 DIAGNOSIS — Z5111 Encounter for antineoplastic chemotherapy: Secondary | ICD-10-CM | POA: Diagnosis not present

## 2023-08-29 MED ORDER — HEPARIN SOD (PORK) LOCK FLUSH 100 UNIT/ML IV SOLN
500.0000 [IU] | Freq: Once | INTRAVENOUS | Status: AC | PRN
  Administered 2023-08-29: 500 [IU]

## 2023-08-29 MED ORDER — SODIUM CHLORIDE 0.9% FLUSH
10.0000 mL | INTRAVENOUS | Status: DC | PRN
  Administered 2023-08-29: 10 mL

## 2023-08-29 NOTE — Patient Instructions (Signed)

## 2023-09-02 ENCOUNTER — Telehealth: Payer: Self-pay | Admitting: Pharmacist

## 2023-09-02 ENCOUNTER — Telehealth: Payer: Self-pay

## 2023-09-02 ENCOUNTER — Encounter: Payer: Self-pay | Admitting: Nurse Practitioner

## 2023-09-02 DIAGNOSIS — E1165 Type 2 diabetes mellitus with hyperglycemia: Secondary | ICD-10-CM

## 2023-09-02 NOTE — Telephone Encounter (Signed)
-----   Message from Lonna Cobb sent at 08/29/2023  3:51 PM EDT ----- Please ask radiology to fix the typos under impression on the recent CT scans.  Thanks

## 2023-09-02 NOTE — Telephone Encounter (Signed)
 I contacted the reading room to request corrections for the typos in the impression "social with more [place] distortion." Misty Stanley indicated that she will forward this request to the radiologist.

## 2023-09-02 NOTE — Progress Notes (Signed)
   09/02/2023 Name: Ethelda Deangelo MRN: [id number] DOB: [birth year]  Chief Complaint  Patient presents with   Medication Management    Diabetes     Marcus Groll is a 58 y.o. year old female who presented for a telephone visit.   They were referred to the pharmacist by their PCP for assistance in managing diabetes.    Subjective:  Care Team: Primary Care Provider: Dorothyann Peng, MD ; Next Scheduled Visit: 12/22/23   Medication Access/Adherence  Current Pharmacy:  [id number] [place],  - [address] AT [facility] [address] [place] [address] Phone: [phone] Fax: [phone]  [facility] - [place], [place] - [address] [place] [address] Phone: [phone] Fax: [phone]  [facility] - [facility] [address] [address] Phone: [phone] Fax: [phone]  [facility] - [place], [place] - [address] [facility] [address] [place] [address] Phone: [phone] Fax: [phone]  [id number] - [place], [place] - [address] AT [facility] OF [address] [place] [address] Phone: [phone] Fax: [phone]   Patient reports affordability concerns with their medications: No  Patient reports access/transportation concerns to their pharmacy: No  Patient reports adherence concerns with their medications:  Yes      Diabetes:  Current medications:  Humalog Kwikpen 6 units with breakfast, 13 units with lunch, 16 units with dinner (plus correction factor) Evaristo Bury 12-18 units daily      Current meal patterns:   08/26/23  AM Blood sugar:  157 mg/dl  gave 15 units ate bagel, [place], sausage and sweet tea Lunch blood sugar: 155 mg/dl 3 choclate almonds, cookies  3:30-209  Supper blood sugar-210 gave 10 units of insulin ate with salmon, rice,  green beans, pineapple  09/02/23 AM blood sugar 261-ate sausage, egg, bun, half&half tea Lunch blood sugar 181-Roasted [place] peas, slice of cake 2:30-251 9 units of insulin Bakes chicken, cabbage, baked potato  After supper- 146 mg/dl   Current physical activity: said she will start walking this week      Objective:  Lab Results  Component Value Date   HGBA1C 8.5 (H) 07/02/2023    Lab Results  Component Value Date   CREATININE 0.81 08/27/2023   BUN 14 08/27/2023   NA 140 08/27/2023   K 3.0 (L) 08/27/2023   CL 111 08/27/2023   CO2 23 08/27/2023    Lab Results  Component Value Date   CHOL 120 12/17/2022   HDL 44 12/17/2022   LDLCALC 62 12/17/2022   LDLDIRECT 97.7 02/11/2014   TRIG 68 12/17/2022   CHOLHDL 2.7 12/17/2022    Medications Reviewed Today   Medications were not reviewed in this encounter       Assessment/Plan:   Diabetes: - Currently uncontrolled A1c from 01/25-8.5 GMI from [place] 9.8% - Reviewed long term cardiovascular and renal outcomes of uncontrolled blood sugar - Reviewed goal A1c, goal fasting, and goal 2 hour post prandial glucose - Reviewed dietary modifications including cutting out sugary drinks - Reviewed lifestyle modifications including: adding exercise - Recommend to follow sliding scale with correction as given from Endocrinology not as she "feels"      Follow Up Plan:    Follow up with the Patient in 2 weeks.  Beecher Mcardle, PharmD, BCACP Clinical Pharmacist [phone]

## 2023-09-04 ENCOUNTER — Other Ambulatory Visit: Payer: Self-pay | Admitting: Oncology

## 2023-09-04 DIAGNOSIS — C250 Malignant neoplasm of head of pancreas: Secondary | ICD-10-CM

## 2023-09-05 ENCOUNTER — Inpatient Hospital Stay: Admitting: General Practice

## 2023-09-05 NOTE — Progress Notes (Signed)
 [facility] Advance Directives Spiritual Care  Patient presented to Advance Directives Clinic  to review and complete healthcare advance directives.  Clinical Chaplain met with patient.  The patient designated Alyssa Ross [phone]) as their primary healthcare agent and Alyssa Ross [phone]) as their secondary agent.  Patient also completed healthcare living will.    Documents were notarized and copies made for patient/family. Chaplain will send documents to medical records to be scanned into patient's chart. Chaplain encouraged patient/family to contact with any additional questions or concerns.   [address] Rush [place], [place], [facility] Pager [phone] Voicemail [phone]

## 2023-09-09 DIAGNOSIS — C259 Malignant neoplasm of pancreas, unspecified: Secondary | ICD-10-CM | POA: Diagnosis not present

## 2023-09-10 ENCOUNTER — Inpatient Hospital Stay

## 2023-09-10 ENCOUNTER — Inpatient Hospital Stay: Attending: Genetic Counselor

## 2023-09-10 ENCOUNTER — Inpatient Hospital Stay (HOSPITAL_BASED_OUTPATIENT_CLINIC_OR_DEPARTMENT_OTHER): Admitting: Oncology

## 2023-09-10 VITALS — BP 126/86 | HR 66 | Temp 98.1°F | Resp 18 | Ht 68.0 in | Wt 182.2 lb

## 2023-09-10 DIAGNOSIS — C250 Malignant neoplasm of head of pancreas: Secondary | ICD-10-CM | POA: Insufficient documentation

## 2023-09-10 DIAGNOSIS — C7801 Secondary malignant neoplasm of right lung: Secondary | ICD-10-CM | POA: Diagnosis not present

## 2023-09-10 DIAGNOSIS — C7802 Secondary malignant neoplasm of left lung: Secondary | ICD-10-CM | POA: Diagnosis not present

## 2023-09-10 DIAGNOSIS — Z9049 Acquired absence of other specified parts of digestive tract: Secondary | ICD-10-CM | POA: Insufficient documentation

## 2023-09-10 DIAGNOSIS — Z79899 Other long term (current) drug therapy: Secondary | ICD-10-CM | POA: Insufficient documentation

## 2023-09-10 DIAGNOSIS — I82622 Acute embolism and thrombosis of deep veins of left upper extremity: Secondary | ICD-10-CM | POA: Diagnosis not present

## 2023-09-10 DIAGNOSIS — C3411 Malignant neoplasm of upper lobe, right bronchus or lung: Secondary | ICD-10-CM | POA: Diagnosis not present

## 2023-09-10 DIAGNOSIS — Z5111 Encounter for antineoplastic chemotherapy: Secondary | ICD-10-CM | POA: Diagnosis not present

## 2023-09-10 DIAGNOSIS — Z85528 Personal history of other malignant neoplasm of kidney: Secondary | ICD-10-CM | POA: Diagnosis not present

## 2023-09-10 DIAGNOSIS — C259 Malignant neoplasm of pancreas, unspecified: Secondary | ICD-10-CM | POA: Diagnosis not present

## 2023-09-10 LAB — CMP (CANCER CENTER ONLY)
ALT: 23 U/L (ref 0–44)
AST: 21 U/L (ref 15–41)
Albumin: 3.2 g/dL — ABNORMAL LOW (ref 3.5–5.0)
Alkaline Phosphatase: 201 U/L — ABNORMAL HIGH (ref 38–126)
Anion gap: 5 (ref 5–15)
BUN: 6 mg/dL (ref 6–20)
CO2: 26 mmol/L (ref 22–32)
Calcium: 8.4 mg/dL — ABNORMAL LOW (ref 8.9–10.3)
Chloride: 112 mmol/L — ABNORMAL HIGH (ref 98–111)
Creatinine: 0.81 mg/dL (ref 0.44–1.00)
GFR, Estimated: >60 mL/min (ref >60)
Glucose, Bld: 102 mg/dL — ABNORMAL HIGH (ref 70–99)
Potassium: 3.3 mmol/L — ABNORMAL LOW (ref 3.5–5.1)
Sodium: 143 mmol/L (ref 135–145)
Total Bilirubin: 0.4 mg/dL (ref 0.0–1.2)
Total Protein: 6.3 g/dL — ABNORMAL LOW (ref 6.5–8.1)

## 2023-09-10 LAB — CBC WITH DIFFERENTIAL (CANCER CENTER ONLY)
Abs Immature Granulocytes: 0 10*3/uL (ref 0.00–0.07)
Basophils Absolute: 0 10*3/uL (ref 0.0–0.1)
Basophils Relative: 1 %
Eosinophils Absolute: 0.1 10*3/uL (ref 0.0–0.5)
Eosinophils Relative: 4 %
HCT: 29.8 % — ABNORMAL LOW (ref 36.0–46.0)
Hemoglobin: 9.6 g/dL — ABNORMAL LOW (ref 12.0–15.0)
Immature Granulocytes: 0 %
Lymphocytes Relative: 29 %
Lymphs Abs: 1 10*3/uL (ref 0.7–4.0)
MCH: 32.8 pg (ref 26.0–34.0)
MCHC: 32.2 g/dL (ref 30.0–36.0)
MCV: 101.7 fL — ABNORMAL HIGH (ref 80.0–100.0)
Monocytes Absolute: 0.4 10*3/uL (ref 0.1–1.0)
Monocytes Relative: 11 %
Neutro Abs: 2 10*3/uL (ref 1.7–7.7)
Neutrophils Relative %: 55 %
Platelet Count: 320 10*3/uL (ref 150–400)
RBC: 2.93 MIL/uL — ABNORMAL LOW (ref 3.87–5.11)
RDW: 15.6 % — ABNORMAL HIGH (ref 11.5–15.5)
WBC Count: 3.6 10*3/uL — ABNORMAL LOW (ref 4.0–10.5)
nRBC: 0 % (ref 0.0–0.2)

## 2023-09-10 LAB — MAGNESIUM: Magnesium: 1.7 mg/dL (ref 1.7–2.4)

## 2023-09-10 MED ORDER — SODIUM CHLORIDE 0.9 % IV SOLN
2400.0000 mg/m2 | INTRAVENOUS | Status: DC
  Administered 2023-09-10: 5000 mg via INTRAVENOUS
  Filled 2023-09-10: qty 100

## 2023-09-10 MED ORDER — ATROPINE SULFATE 1 MG/ML IV SOLN
0.5000 mg | Freq: Once | INTRAVENOUS | Status: AC | PRN
  Administered 2023-09-10: 0.5 mg via INTRAVENOUS
  Filled 2023-09-10: qty 1

## 2023-09-10 MED ORDER — SODIUM CHLORIDE 0.9 % IV SOLN
55.0000 mg/m2 | Freq: Once | INTRAVENOUS | Status: AC
  Administered 2023-09-10: 107.5 mg via INTRAVENOUS
  Filled 2023-09-10: qty 25

## 2023-09-10 MED ORDER — PALONOSETRON HCL INJECTION 0.25 MG/5ML
0.2500 mg | Freq: Once | INTRAVENOUS | Status: AC
  Administered 2023-09-10: 0.25 mg via INTRAVENOUS
  Filled 2023-09-10: qty 5

## 2023-09-10 MED ORDER — DEXAMETHASONE SODIUM PHOSPHATE 10 MG/ML IJ SOLN
10.0000 mg | Freq: Once | INTRAMUSCULAR | Status: AC
  Administered 2023-09-10: 10 mg via INTRAVENOUS
  Filled 2023-09-10: qty 1

## 2023-09-10 MED ORDER — LEUCOVORIN CALCIUM INJECTION 350 MG
400.0000 mg/m2 | Freq: Once | INTRAMUSCULAR | Status: AC
  Administered 2023-09-10: 776 mg via INTRAVENOUS
  Filled 2023-09-10: qty 25

## 2023-09-10 MED ORDER — APIXABAN 5 MG PO TABS: 5.0000 mg | ORAL_TABLET | Freq: Two times a day (BID) | ORAL | 2 refills | Status: DC

## 2023-09-10 MED ORDER — SODIUM CHLORIDE 0.9 % IV SOLN: INTRAVENOUS | Status: DC

## 2023-09-10 NOTE — Progress Notes (Signed)
 [facility] OFFICE PROGRESS NOTE   Diagnosis: Pancreas cancer  INTERVAL HISTORY:   Alyssa Ross completed another cycle of chemotherapy on 08/27/2023.  Nausea/vomiting, mouth sores, or diarrhea.  She has stable neuropathy symptoms.  She reports constipation relieved with MiraLAX.  Objective:  Vital signs in last 24 hours:  Blood pressure 126/86, pulse 66, temperature 98.1 F (36.7 C), temperature source Temporal, resp. rate 18, height 5\' 8"  (1.727 m), weight 182 lb 3.2 oz (82.6 kg), last menstrual period 09/17/2012, SpO2 100%.    HEENT: No thrush or ulcers Resp: Lungs clear bilaterally Cardio: Regular rate and rhythm GI: Nontender, no mass, no hepatosplenomegaly Vascular: No leg edema   Portacath/PICC-without erythema  Lab Results:  Lab Results  Component Value Date   WBC 3.6 (L) 09/10/2023   HGB 9.6 (L) 09/10/2023   HCT 29.8 (L) 09/10/2023   MCV 101.7 (H) 09/10/2023   PLT 320 09/10/2023   NEUTROABS 2.0 09/10/2023    CMP  Lab Results  Component Value Date   NA 143 09/10/2023   K 3.3 (L) 09/10/2023   CL 112 (H) 09/10/2023   CO2 26 09/10/2023   GLUCOSE 102 (H) 09/10/2023   BUN 6 09/10/2023   CREATININE 0.81 09/10/2023   CALCIUM 8.4 (L) 09/10/2023   PROT 6.3 (L) 09/10/2023   ALBUMIN 3.2 (L) 09/10/2023   AST 21 09/10/2023   ALT 23 09/10/2023   ALKPHOS 201 (H) 09/10/2023   BILITOT 0.4 09/10/2023   GFRNONAA >60 09/10/2023   GFRAA >60 02/07/2020    Lab Results  Component Value Date   CAN199 323 (H) 08/27/2023    Lab Results  Component Value Date   INR 1.0 01/27/2023   LABPROT 13.6 01/27/2023    Imaging:  No results found.  Medications: I have reviewed the patient's current medications.   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 Tumor invades the duodenal wall and vascular groove, resection margins negative, 4/34 lymph nodes positive MSI-stable, tumor showed instability  in 2 loci as did adjacent normal tissue Foundation 1-K-ras G12 V, microsatellite status and tumor mutation burden cannot be determined EUS FNA biopsy of pancreas mass on 07/03/2018-well-differentiated neuroendocrine tumor CTs 01/29/2019-ill-defined pancreas head mass, 5 pulmonary nodules-1 with a small amount of central cavitation, tumor abuts the left margin of the portal vein indistinct density surrounding, hepatic artery, complex cystic lesion of the right kidney, right adrenal mass-characterized as an adenoma on a [facility] MRI 12/21/2018 Netspot 03/03/2019-no focal pancreas activity, no tracer accumulation within the suspicious pulmonary nodules, left uterine mass with tracer accumulation felt to represent a leiomyoma Elevated preoperative CA 19-9--CA 19-9 186 on 01/18/2019 CT chest 04/16/2019-multiple bilateral pulmonary nodules, some with increased cavitation, stable in size Cycle 1 FOLFIRINOX 04/27/2019 Cycle 2 FOLFIRINOX 05/11/2019 Cycle 3 FOLFIRINOX 05/23/2019 Cycle 4 FOLFIRINOX 06/08/2019 Cycle 5 FOLFIRINOX 06/22/2019 CT chest 07/02/2019-stable size of bilateral pulmonary nodules.  Dominant cavitary lesions in both lungs show increased cavitation with thinner walls.  Stable 2.1 cm right adrenal nodule. 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 No definite evidence of metastatic disease in the abdomen or pelvis. Markedly enlarged and heterogeneous appearing uterus, likely to represent  multifocal fibroids. 1 of these lesions appears to be an exophytic subserosal fibroid in the posterior  lateral aspect of the uterine body on the left side although this comes in close proximity to the left adnexa such that a primary ovarian lesion is difficult to completely exclude. CTs 02/07/2020-slight enlargement of bilateral lung nodules, some are cavitary, no evidence of metastatic disease in the abdomen or pelvis, stable right adrenal nodule, uterine fibroids CTs 04/26/2020-mild enlargement of pulmonary nodules, slight increase in trace pelvic fluid, new soft tissue thickening inferior to the cecal tip suspicious for peritoneal metastasis CT 05/26/2020-improved appearance of soft tissue at the inferior tip of the cecum, mildly thickened short appendix-findings suggestive of resolving appendicitis, stable small bibasilar pulmonary nodules, fibroids Plan biopsy of right cecal tip soft tissue canceled secondary to radiologic improvement CT chest 08/02/2020-enlargement and progressive cavitation of multiple bilateral lung nodules.  Some new nodules are present. CTs 10/24/2020- increase in size of pulmonary nodules, no new nodules, no evidence of metastatic disease in the abdomen, stable right adrenal nodule CT 01/09/2021-slight interval enlargement of pulmonary nodules, stable right adrenal nodule Navigation bronchoscopy 01/30/2021-left lower lobe cavitary nodule FNA-adenocarcinoma, brushing-adenocarcinoma.  Left lower lobe lavage-adenocarcinoma.  Right upper lobe brushing and FNA biopsy of cavitary nodule-adenocarcinoma-immunohistochemical profile consistent with pancreas adenocarcinoma Cycle 1 gemcitabine/Abraxane 03/28/2021 Cycle 2 gemcitabine/Abraxane 04/11/2021 Cycle 3 gemcitabine/Abraxane 04/25/2021 Cycle 4 gemcitabine/Abraxane 05/09/2021 Cycle 5 gemcitabine/Abraxane 05/23/2021 CT chest 06/05/2021-interval cavitation of multiple pulmonary nodules, some nodules have decreased in size, no new or enlarging nodules Cycle 6 gemcitabine/Abraxane 06/06/2021 Cycle 7 gemcitabine/Abraxane 06/21/2021 Cycle 8  gemcitabine/Abraxane 07/05/2021 Cycle 9 Gemcitabine/Abraxane 07/19/2021 Cycle 10 gemcitabine 08/01/2021-Abraxane held secondary to neuropathy CT chest 08/13/2021-mild decrease in size and wall thickness of multiple cavitary nodules, no new or progressive disease in the chest, indeterminate low-attenuation right liver lesions Cycle 11 gemcitabine 08/16/2021-Abraxane held secondary to neuropathy Cycle 12 gemcitabine 08/30/2021-Abraxane held secondary to neuropathy Cycle 13 gemcitabine 09/13/2021-Abraxane held secondary to neuropathy Cycle 14 gemcitabine 09/27/2021-Abraxane held secondary to neuropathy Cycle 15 gemcitabine 10/11/2021-Abraxane held secondary to neuropathy CTs 10/22/2021-no change in multiple cavitary lung nodules, no evidence of disease progression, ill-defined hypodense lesion in the posterior right liver suspicious for metastatic disease Cycle 16 gemcitabine 10/25/2021 Cycle 17 gemcitabine 11/08/2021 Cycle 18 Gemcitabine 11/22/2021 Cycle 19 gemcitabine 12/06/2021 Cycle 20 Gemcitabine 12/20/2021 CT 12/31/2021-mild increase in size of bilateral pulmonary metastases, stable subtle continuation right liver lesions Cycle 20 gemcitabine/Abraxane 01/03/2022 Cycle 21 gemcitabine/Abraxane 01/17/2022 Cycle 22 gemcitabine/Abraxane 01/31/2022 Cycle 23 gemcitabine/Abraxane 02/14/2022 Cycle 24 gemcitabine/Abraxane 02/28/2022 CTs 03/11/2022-widespread metastatic disease to the lungs again noted with slight involution of several of the pulmonary nodules, no definite new nodules noted; interval cavitation of solid lesion in the posterior aspect right lobe of the liver, no new liver lesions noted. Cycle 25 Gemcitabine/Abraxane 03/14/2022 Cycle 26 gemcitabine/Abraxane 03/28/2022 Cycle 27 gemcitabine/Abraxane 04/25/2022 Cycle 28 gemcitabine/Abraxane 05/09/2022 Cycle 29 Gemcitabine 05/23/2022, Abraxane held due to neuropathy CTs 06/04/2022-stable multifocal cavitary pulmonary nodules.  No definite new nodules.  No new  nodules greater than a centimeter.  Decreased size of the right posterior hepatic lobe lesion.  Generalized heterogeneity and new focal areas of hypodensity in the liver. Cycle 30 Gemcitabine 06/06/2022, Abraxane held due to neuropathy Cycle 31 gemcitabine 06/20/2022, Abraxane held due to neuropathy Cycle 32 gemcitabine 07/04/2022, Abraxane held due to neuropathy Cycle 33 gemcitabine  07/18/2022 ,Abraxane held due to neuropathy Cycle 34 gemcitabine 08/15/2022, Abraxane held due to neuropathy Cycle 35 gemcitabine 08/29/2022, Abraxane held due to neuropathy CTs 09/10/2022-enlarging pulmonary metastases.  Subtle  poorly defined hypoattenuating lesions in the liver, less well-seen than on 06/04/2022.  Suspected new lesion in the dome of the right hepatic lobe Cycle 36 gemcitabine/Abraxane 09/12/2022 Cycle 37 gemcitabine/Abraxane 09/26/2022 Cycle 38 gemcitabine/Abraxane 10/10/2022 Cycle 39 gemcitabine/Abraxane 11/07/2022 Cycle 40 gemcitabine/Abraxane 11/21/2022 Cycle 41 gemcitabine/Abraxane 12/05/2022 Cycle 42 gemcitabine/Abraxane 12/19/2022 CT is 12/30/2022-new left hepatic lesion, stable bilateral pulmonary metastases Cycle 43 gemcitabine/Abraxane 01/02/2023 CT-guided biopsy of liver lesion 01/27/2023-acute/chronic inflammation, negative for malignancy, culture negative Cycle 44 gemcitabine/Abraxane 01/30/2023 Cycle 45 gemcitabine/Abraxane 02/13/2023 Cycle 46 gemcitabine/Abraxane 02/27/2023 Cycle 47 gemcitabine/Abraxane 03/13/2023 Cycle 48 gemcitabine/Abraxane 03/27/2023 CTs 04/21/2023-numerous solid and cavitary lung nodules overall similar, some slightly enlarged; multiple new and enlarging rim-enhancing liver lesions. Cycle 1 FOLFIRI with liposomal irinotecan 05/21/2023 Cycle 2 held 06/05/2023 due to neutropenia Cycle 2 FOLFIRI with liposomal irinotecan 06/18/2023, irinotecan dose reduced (white cell growth factor declined per patient) Cycle 3 FOLFIRI with liposomal irinotecan 07/02/2023 Cycle 4 FOLFIRI with liposomal  irinotecan 07/16/2023 Cycle 5 FOLFIRI with liposomal irinotecan 07/30/2023 Cycle 6 FOLFIRI with liposomal irinotecan 08/13/2023 CTs 08/20/2023-numerous cavitary lesions seen in the lungs, extent and distribution appear similar.  A few lesions are slightly smaller.  Numerous liver lesions are now smaller.  Stable right adrenal nodule, adenoma favored. 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  Apixaban 10/24/2022 Port-A-Cath malfunction 08/01/2023.  Port-A-Cath removed and replaced 08/08/2023.     Disposition: Alyssa Ross appears stable.  She is tolerating chemotherapy well.  The CA 19-9 was lower 2 weeks ago and the restaging CTs 08/20/2023 are consistent with a response to treatment.  She will complete another cycle of 5-FU/liposomal irinotecan today.  She will return for an office visit and chemotherapy in 2 weeks.  Thornton Papas, MD  09/10/2023  11:00 AM

## 2023-09-10 NOTE — Progress Notes (Signed)
 Patient seen by Dr. Thornton Papas today  Vitals are within treatment parameters:Yes   Labs are within treatment parameters: Yes  K+ 3.3--OK Treatment plan has been signed: Yes   Per physician team, Patient is ready for treatment and there are NO modifications to the treatment plan.

## 2023-09-11 LAB — CANCER ANTIGEN 19-9: CA 19-9: 251 U/mL — ABNORMAL HIGH (ref 0–35)

## 2023-09-12 ENCOUNTER — Telehealth: Payer: Self-pay

## 2023-09-12 ENCOUNTER — Inpatient Hospital Stay

## 2023-09-12 VITALS — BP 118/78 | HR 73 | Temp 98.0°F | Resp 18

## 2023-09-12 DIAGNOSIS — Z5111 Encounter for antineoplastic chemotherapy: Secondary | ICD-10-CM | POA: Diagnosis not present

## 2023-09-12 DIAGNOSIS — C7801 Secondary malignant neoplasm of right lung: Secondary | ICD-10-CM | POA: Diagnosis not present

## 2023-09-12 DIAGNOSIS — C7802 Secondary malignant neoplasm of left lung: Secondary | ICD-10-CM | POA: Diagnosis not present

## 2023-09-12 DIAGNOSIS — Z9049 Acquired absence of other specified parts of digestive tract: Secondary | ICD-10-CM | POA: Diagnosis not present

## 2023-09-12 DIAGNOSIS — C3411 Malignant neoplasm of upper lobe, right bronchus or lung: Secondary | ICD-10-CM | POA: Diagnosis not present

## 2023-09-12 DIAGNOSIS — Z79899 Other long term (current) drug therapy: Secondary | ICD-10-CM | POA: Diagnosis not present

## 2023-09-12 DIAGNOSIS — C250 Malignant neoplasm of head of pancreas: Secondary | ICD-10-CM

## 2023-09-12 DIAGNOSIS — Z85528 Personal history of other malignant neoplasm of kidney: Secondary | ICD-10-CM | POA: Diagnosis not present

## 2023-09-12 MED ORDER — HEPARIN SOD (PORK) LOCK FLUSH 100 UNIT/ML IV SOLN
500.0000 [IU] | Freq: Once | INTRAVENOUS | Status: AC | PRN
  Administered 2023-09-12: 500 [IU]

## 2023-09-12 MED ORDER — SODIUM CHLORIDE 0.9% FLUSH
10.0000 mL | INTRAVENOUS | Status: DC | PRN
  Administered 2023-09-12: 10 mL

## 2023-09-12 NOTE — Telephone Encounter (Signed)
 Patient gave verbal understanding and had no further questions or concerns

## 2023-09-12 NOTE — Telephone Encounter (Signed)
-----   Message from Lonna Cobb sent at 09/12/2023  1:04 PM EDT ----- Please let her know the CA 19-9 tumor marker was lower.  Follow-up as scheduled.

## 2023-09-18 ENCOUNTER — Encounter: Payer: Self-pay | Admitting: Oncology

## 2023-09-18 ENCOUNTER — Telehealth: Payer: Self-pay | Admitting: *Deleted

## 2023-09-18 DIAGNOSIS — C250 Malignant neoplasm of head of pancreas: Secondary | ICD-10-CM

## 2023-09-18 MED ORDER — HYDROXYZINE HCL 10 MG PO TABS: 10.0000 mg | ORAL_TABLET | Freq: Three times a day (TID) | ORAL | 0 refills | Status: DC | PRN

## 2023-09-18 NOTE — Telephone Encounter (Signed)
 Notified Alyssa Ross that we will check her CMP tomorrow at 0815 and she agrees. Instructed if she develops severe abdominal pain or fever to go to emergency room. Script for hydroxyzine sent to her pharmacy.

## 2023-09-19 ENCOUNTER — Inpatient Hospital Stay

## 2023-09-19 DIAGNOSIS — Z85528 Personal history of other malignant neoplasm of kidney: Secondary | ICD-10-CM | POA: Diagnosis not present

## 2023-09-19 DIAGNOSIS — C250 Malignant neoplasm of head of pancreas: Secondary | ICD-10-CM | POA: Diagnosis not present

## 2023-09-19 DIAGNOSIS — Z79899 Other long term (current) drug therapy: Secondary | ICD-10-CM | POA: Diagnosis not present

## 2023-09-19 DIAGNOSIS — C7801 Secondary malignant neoplasm of right lung: Secondary | ICD-10-CM | POA: Diagnosis not present

## 2023-09-19 DIAGNOSIS — C3411 Malignant neoplasm of upper lobe, right bronchus or lung: Secondary | ICD-10-CM | POA: Diagnosis not present

## 2023-09-19 DIAGNOSIS — C7802 Secondary malignant neoplasm of left lung: Secondary | ICD-10-CM | POA: Diagnosis not present

## 2023-09-19 DIAGNOSIS — C259 Malignant neoplasm of pancreas, unspecified: Secondary | ICD-10-CM | POA: Diagnosis not present

## 2023-09-19 DIAGNOSIS — Z5111 Encounter for antineoplastic chemotherapy: Secondary | ICD-10-CM | POA: Diagnosis not present

## 2023-09-19 DIAGNOSIS — Z9049 Acquired absence of other specified parts of digestive tract: Secondary | ICD-10-CM | POA: Diagnosis not present

## 2023-09-19 LAB — CMP (CANCER CENTER ONLY)
ALT: 90 U/L — ABNORMAL HIGH (ref 0–44)
AST: 31 U/L (ref 15–41)
Albumin: 3.6 g/dL (ref 3.5–5.0)
Alkaline Phosphatase: 395 U/L — ABNORMAL HIGH (ref 38–126)
Anion gap: 5 (ref 5–15)
BUN: 9 mg/dL (ref 6–20)
CO2: 26 mmol/L (ref 22–32)
Calcium: 8.8 mg/dL — ABNORMAL LOW (ref 8.9–10.3)
Chloride: 106 mmol/L (ref 98–111)
Creatinine: 0.93 mg/dL (ref 0.44–1.00)
GFR, Estimated: >60 mL/min (ref >60)
Glucose, Bld: 213 mg/dL — ABNORMAL HIGH (ref 70–99)
Potassium: 3.4 mmol/L — ABNORMAL LOW (ref 3.5–5.1)
Sodium: 137 mmol/L (ref 135–145)
Total Bilirubin: 0.6 mg/dL (ref 0.0–1.2)
Total Protein: 7.2 g/dL (ref 6.5–8.1)

## 2023-09-22 ENCOUNTER — Encounter: Payer: Self-pay | Admitting: Nurse Practitioner

## 2023-09-22 ENCOUNTER — Inpatient Hospital Stay (HOSPITAL_BASED_OUTPATIENT_CLINIC_OR_DEPARTMENT_OTHER): Admitting: Nurse Practitioner

## 2023-09-22 DIAGNOSIS — Z7189 Other specified counseling: Secondary | ICD-10-CM

## 2023-09-22 DIAGNOSIS — Z515 Encounter for palliative care: Secondary | ICD-10-CM | POA: Diagnosis not present

## 2023-09-22 DIAGNOSIS — R530 Neoplastic (malignant) related fatigue: Secondary | ICD-10-CM | POA: Diagnosis not present

## 2023-09-22 DIAGNOSIS — M792 Neuralgia and neuritis, unspecified: Secondary | ICD-10-CM | POA: Diagnosis not present

## 2023-09-22 NOTE — Progress Notes (Signed)
 Palliative Medicine [facility]  Telephone:([phone] Fax:([phone]) [phone]   Name: Alyssa Ross Date: 09/22/2023 MRN: [id number]  DOB: 10-06-1965  Patient Care Team: Dorothyann Peng, MD as PCP - General (Internal Medicine) Barrie Folk, RN (Inactive) as Oncology Nurse Navigator Pickenpack-Cousar, Arty Baumgartner, NP as Nurse Practitioner [facility] and Palliative Medicine)   I connected with Rogelia Rohrer on 09/22/23 at 11:00 AM EDT by Phone and verified that I am speaking with the correct person using two identifiers.   I discussed the limitations, risks, security and privacy concerns of performing an evaluation and management service by telemedicine and the availability of in-person appointments. I also discussed with the patient that there may be a patient responsible charge related to this service. The patient expressed understanding and agreed to proceed.   Other persons participating in the visit and their role in the encounter: N/A   Patient's location: Home  Provider's location: [facility]    INTERVAL HISTORY: Alyssa Ross is a 58 y.o. female with oncologic medical history including pancreatic cancer (01/2019). The patient is 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 and is currently receiving FOLFIRI with liposomal irinotecan. Palliative ask to see for symptom management and goals of care.   SOCIAL HISTORY:     reports that she has never smoked. She has never used smokeless tobacco. She reports that she does not currently use alcohol. She reports that she does not use drugs.  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  MEDICATIONS:  Current Outpatient Medications  Medication Sig Dispense Refill   apixaban (ELIQUIS) 5 MG TABS tablet Take 1 tablet (5 mg total) by mouth 2 (two) times daily. 180 tablet 2   cetirizine (ZYRTEC) 10 MG tablet Take 10 mg by mouth daily.     Continuous Blood Gluc Sensor (FREESTYLE LIBRE 2 SENSOR) MISC APPLY EVERY 14 DAYS 2 each 5   glucose blood test strip Check sugar 2 times daily. 100 each 12   hydrOXYzine (ATARAX) 10 MG tablet Take 1 tablet (10 mg total) by mouth 3 (three) times daily as needed. 30 tablet 0   Insulin Degludec (TRESIBA FLEXTOUCH [place]) Inject into the skin. 12-18 Units     insulin lispro (HUMALOG KWIKPEN) 200 UNIT/ML KwikPen 6 units with breakfast, 13 units with lunch, 16 units with dinner     Insulin Pen Needle (BD PEN NEEDLE NANO U/F) 32G X 4 MM MISC USE TO INJECT INSULIN THREE TIMES A DAY 270 each 3   lidocaine-prilocaine (EMLA) cream Apply 1 Application topically as directed. Apply 1 hour prior to stick and cover with plastic wrap 30 g 5  magic mouthwash (nystatin, diphenhydrAMINE, alum & mag hydroxide) suspension mixture Take 5 mLs by mouth 4 (four) times daily as needed for mouth pain. (Patient not taking: Reported on 09/10/2023) 240 mL 1   magnesium oxide (MAG-OX) 400 (240 Mg) MG tablet TAKE 1 TABLET(400 MG) BY MOUTH TWICE DAILY 180 tablet 1   ondansetron (ZOFRAN) 8 MG tablet Take 1 tablet (8 mg total) by mouth every 8 (eight) hours as needed for nausea or vomiting (do not begin until 72 hours after chemo). (Patient not taking: Reported on  09/10/2023) 20 tablet 3   potassium chloride SA (KLOR-CON M) 20 MEQ tablet Take 1 tablet (20 mEq total) by mouth 3 (three) times daily. 270 tablet 1   prochlorperazine (COMPAZINE) 10 MG tablet TAKE 1 TABLET(10 MG) BY MOUTH EVERY 6 HOURS AS NEEDED FOR NAUSEA OR VOMITING (Patient not taking: Reported on 09/10/2023) 60 tablet 2   rosuvastatin (CRESTOR) 10 MG tablet TAKE 1 TABLET AT BEDTIME (Patient taking differently: Take 10 mg by mouth 3 (three) times a week.) 90 tablet 3   spironolactone (ALDACTONE) 25 MG tablet TAKE 1 TABLET(25 MG) BY MOUTH DAILY (Patient not taking: Reported on 09/10/2023) 90 tablet 2   valsartan (DIOVAN) 160 MG tablet Take 1 tablet (160 mg total) by mouth daily. 90 tablet 3   No current facility-administered medications for this visit.   Facility-Administered Medications Ordered in Other Visits  Medication Dose Route Frequency Provider Last Rate Last Admin   sodium chloride flush (NS) 0.9 % injection 10 mL  10 mL Intravenous PRN [name], Lisa K, NP   10 mL at 10/24/22 1311    VITAL SIGNS: LMP 09/17/2012  There were no vitals filed for this visit.  Estimated body mass index is 27.7 kg/m as calculated from the following:   Height as of 09/10/23: 5\' 8"  (1.727 m).   Weight as of 09/10/23: 182 lb 3.2 oz (82.6 kg).   PERFORMANCE STATUS (ECOG) : 1 - Symptomatic but completely ambulatory  IMPRESSION: Discussed the use of AI scribe software for clinical note transcription with the patient, who gave verbal consent to proceed.  History of Present Illness Alyssa Ross is a 58 year old female with pancreatic cancer who I connected with by phone for symptom management. She is doing well overall. No acute distress. Denies concerns of nausea or vomiting.   She recently experienced significant pruritus and fatigue, which she associated with previous symptoms of a blocked bile duct before her cancer diagnosis. Blood work at the cancer center showed elevated alkaline phosphatase and ALT levels.  The pruritus has improved and is managed with hydroxyzine, although it causes drowsiness, limiting its use during the day. Her energy levels are almost back to normal.  Ms. Quigley reports ongoing issues with constipation and diarrhea, noting that she can experience both within a short time frame. She has been taking Miralax more consistently, starting on the day of her last treatment, which seems to have helped. However, she still experiences hemorrhoids, which she finds particularly bothersome.  Patient reports use of Preparation H for her hemorrhoids with some relief.  Education provided on the use of Proctofoam for better relief.  She plans to review medication and to discuss further with her oncology team on Wednesday at appointments.  Ms. Lazenby also reports significant flatulence, which she attributes to her diet, particularly sugar intake. She describes episodes of excessive gas throughout the day, which she finds embarrassing and difficult to control.  Education provided on the use of simethicone  or Beano for support.  She verbalized understanding and appreciation.  She mentions neuropathy as a persistent issue, although it is currently not too severe and is manageable.  Patient has completed advanced directives naming Mondalyn and Jenasia Dolinar as her medical decision makers in the event she is unable to speak for herself.  No desire for artificial feedings.  No life prolonging measures in the event of a terminal state.  I discussed the importance of continued conversation with family and their medical providers regarding overall plan of care and treatment options, ensuring decisions are within the context of the patients values and GOCs. Assessment & Plan Pruritus Recent episode of pruritus.  Hydroxyzine effective but causes drowsiness. - Monitor liver function tests per oncology team. - Continue hydroxyzine as needed. - Consider splitting hydroxyzine dose to reduce  drowsiness.  Fatigue Fatigue likely due to cancer treatment and liver enzyme fluctuations. - Monitor energy levels and liver function tests.  Patient reports this has improved.  Constipation and Hemorrhoids Constipation and hemorrhoids managed with hydration and Miralax. Hemorrhoids cause discomfort and bleeding. - Continue Miralax as needed. - Discuss Proctofoam prescription with oncologist.  Flatulence Flatulence possibly related to diet and cancer treatment. - Consider simethicone or Beano. - Evaluate dietary intake and reduce sugar consumption.  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  Goals of Care Advanced directive completed. MOST form pending completion. - Schedule call to complete MOST form in the upcoming weeks.  Follow-up Lab work scheduled to monitor liver function. Follow-up call planned post-treatment. - I will plan to see patient back in 6-8 weeks.  Sooner if needed.  Patient expressed understanding and was in agreement with this plan. She also understands that She can call the clinic at any time with any questions, concerns, or complaints.   Visit consisted of counseling and education dealing with the complex and emotionally intense issues of symptom management and palliative care in the setting of serious and potentially life-threatening illness.  Dellia Ferguson, AGPCNP-BC  Palliative Medicine Team/[facility]

## 2023-09-23 ENCOUNTER — Telehealth: Payer: Self-pay | Admitting: Nurse Practitioner

## 2023-09-23 NOTE — Telephone Encounter (Signed)
 Alyssa Ross confirmed her appointment, she also mentioned that she would like a telephone appointment.

## 2023-09-23 NOTE — Telephone Encounter (Signed)
 Scheduled patient's appts. Advised patient to contact us if rescheduling is needed. Provided my direct line.

## 2023-09-24 ENCOUNTER — Inpatient Hospital Stay

## 2023-09-24 ENCOUNTER — Inpatient Hospital Stay: Admitting: Oncology

## 2023-09-24 ENCOUNTER — Other Ambulatory Visit: Payer: Self-pay | Admitting: *Deleted

## 2023-09-24 VITALS — BP 121/82 | HR 68 | Temp 98.2°F | Resp 18 | Ht 68.0 in | Wt 182.2 lb

## 2023-09-24 VITALS — BP 124/81 | HR 66

## 2023-09-24 DIAGNOSIS — Z79899 Other long term (current) drug therapy: Secondary | ICD-10-CM | POA: Diagnosis not present

## 2023-09-24 DIAGNOSIS — C250 Malignant neoplasm of head of pancreas: Secondary | ICD-10-CM

## 2023-09-24 DIAGNOSIS — Z5111 Encounter for antineoplastic chemotherapy: Secondary | ICD-10-CM | POA: Diagnosis not present

## 2023-09-24 DIAGNOSIS — C7801 Secondary malignant neoplasm of right lung: Secondary | ICD-10-CM | POA: Diagnosis not present

## 2023-09-24 DIAGNOSIS — Z9049 Acquired absence of other specified parts of digestive tract: Secondary | ICD-10-CM | POA: Diagnosis not present

## 2023-09-24 DIAGNOSIS — E876 Hypokalemia: Secondary | ICD-10-CM

## 2023-09-24 DIAGNOSIS — C259 Malignant neoplasm of pancreas, unspecified: Secondary | ICD-10-CM | POA: Diagnosis not present

## 2023-09-24 DIAGNOSIS — C7802 Secondary malignant neoplasm of left lung: Secondary | ICD-10-CM | POA: Diagnosis not present

## 2023-09-24 DIAGNOSIS — Z85528 Personal history of other malignant neoplasm of kidney: Secondary | ICD-10-CM | POA: Diagnosis not present

## 2023-09-24 DIAGNOSIS — C3411 Malignant neoplasm of upper lobe, right bronchus or lung: Secondary | ICD-10-CM | POA: Diagnosis not present

## 2023-09-24 LAB — CBC WITH DIFFERENTIAL (CANCER CENTER ONLY)
Abs Immature Granulocytes: 0.01 10*3/uL (ref 0.00–0.07)
Basophils Absolute: 0.1 10*3/uL (ref 0.0–0.1)
Basophils Relative: 2 %
Eosinophils Absolute: 0.1 10*3/uL (ref 0.0–0.5)
Eosinophils Relative: 2 %
HCT: 29.8 % — ABNORMAL LOW (ref 36.0–46.0)
Hemoglobin: 9.7 g/dL — ABNORMAL LOW (ref 12.0–15.0)
Immature Granulocytes: 0 %
Lymphocytes Relative: 28 %
Lymphs Abs: 1.1 10*3/uL (ref 0.7–4.0)
MCH: 32.7 pg (ref 26.0–34.0)
MCHC: 32.6 g/dL (ref 30.0–36.0)
MCV: 100.3 fL — ABNORMAL HIGH (ref 80.0–100.0)
Monocytes Absolute: 0.3 10*3/uL (ref 0.1–1.0)
Monocytes Relative: 8 %
Neutro Abs: 2.3 10*3/uL (ref 1.7–7.7)
Neutrophils Relative %: 60 %
Platelet Count: 340 10*3/uL (ref 150–400)
RBC: 2.97 MIL/uL — ABNORMAL LOW (ref 3.87–5.11)
RDW: 14.4 % (ref 11.5–15.5)
WBC Count: 3.9 10*3/uL — ABNORMAL LOW (ref 4.0–10.5)
nRBC: 0 % (ref 0.0–0.2)

## 2023-09-24 LAB — CMP (CANCER CENTER ONLY)
ALT: 30 U/L (ref 0–44)
AST: 19 U/L (ref 15–41)
Albumin: 3.3 g/dL — ABNORMAL LOW (ref 3.5–5.0)
Alkaline Phosphatase: 251 U/L — ABNORMAL HIGH (ref 38–126)
Anion gap: 7 (ref 5–15)
BUN: 19 mg/dL (ref 6–20)
CO2: 26 mmol/L (ref 22–32)
Calcium: 8.7 mg/dL — ABNORMAL LOW (ref 8.9–10.3)
Chloride: 110 mmol/L (ref 98–111)
Creatinine: 0.87 mg/dL (ref 0.44–1.00)
GFR, Estimated: >60 mL/min (ref >60)
Glucose, Bld: 85 mg/dL (ref 70–99)
Potassium: 3.2 mmol/L — ABNORMAL LOW (ref 3.5–5.1)
Sodium: 143 mmol/L (ref 135–145)
Total Bilirubin: 0.4 mg/dL (ref 0.0–1.2)
Total Protein: 6.5 g/dL (ref 6.5–8.1)

## 2023-09-24 LAB — MAGNESIUM: Magnesium: 1.6 mg/dL — ABNORMAL LOW (ref 1.7–2.4)

## 2023-09-24 MED ORDER — PALONOSETRON HCL INJECTION 0.25 MG/5ML
0.2500 mg | Freq: Once | INTRAVENOUS | Status: AC
  Administered 2023-09-24: 0.25 mg via INTRAVENOUS
  Filled 2023-09-24: qty 5

## 2023-09-24 MED ORDER — DEXAMETHASONE SODIUM PHOSPHATE 10 MG/ML IJ SOLN
10.0000 mg | Freq: Once | INTRAMUSCULAR | Status: AC
  Administered 2023-09-24: 10 mg via INTRAVENOUS
  Filled 2023-09-24: qty 1

## 2023-09-24 MED ORDER — SODIUM CHLORIDE 0.9 % IV SOLN
INTRAVENOUS | Status: DC
Start: 2023-09-24 — End: 2023-09-24

## 2023-09-24 MED ORDER — ATROPINE SULFATE 1 MG/ML IV SOLN
0.5000 mg | Freq: Once | INTRAVENOUS | Status: AC | PRN
  Administered 2023-09-24: 0.5 mg via INTRAVENOUS
  Filled 2023-09-24: qty 1

## 2023-09-24 MED ORDER — SODIUM CHLORIDE 0.9 % IV SOLN
55.0000 mg/m2 | Freq: Once | INTRAVENOUS | Status: AC
  Administered 2023-09-24: 107.5 mg via INTRAVENOUS
  Filled 2023-09-24: qty 25

## 2023-09-24 MED ORDER — POTASSIUM CHLORIDE 10 MEQ/100ML IV SOLN
10.0000 meq | INTRAVENOUS | Status: AC
  Administered 2023-09-24 (×2): 10 meq via INTRAVENOUS
  Filled 2023-09-24 (×2): qty 100

## 2023-09-24 MED ORDER — SODIUM CHLORIDE 0.9 % IV SOLN
400.0000 mg/m2 | Freq: Once | INTRAVENOUS | Status: AC
  Administered 2023-09-24: 776 mg via INTRAVENOUS
  Filled 2023-09-24: qty 25

## 2023-09-24 MED ORDER — MAGNESIUM SULFATE 2 GM/50ML IV SOLN
2.0000 g | Freq: Once | INTRAVENOUS | Status: AC
  Administered 2023-09-24: 2 g via INTRAVENOUS
  Filled 2023-09-24: qty 50

## 2023-09-24 MED ORDER — SODIUM CHLORIDE 0.9 % IV SOLN
2400.0000 mg/m2 | INTRAVENOUS | Status: DC
  Administered 2023-09-24: 5000 mg via INTRAVENOUS
  Filled 2023-09-24: qty 100

## 2023-09-24 NOTE — Progress Notes (Signed)
 Alyssa Ross returns as scheduled.  She completed another cycle of 5-FU/liposomal irinotecan 09/10/2023.  No nausea/vomiting or diarrhea.  She generally feels well.  Good appetite.  She is working. She developed diffuse pruritus approximately 1 week following chemotherapy.  A chemistry panel 09/19/2023 found the alkaline phosphatase at 395, AST 31, ALT 90, and bilirubin 0.6.  She reports the pruritus has resolved.  She did not develop jaundice or dark urine.  Objective:  Vital signs in last 24 hours:  Blood pressure 121/82, pulse 68, temperature 98.2 F (36.8 C), temperature source Temporal, resp. rate 18, height 5\' 8"  (1.727 m), weight 182 lb 3.2 oz (82.6 kg), last menstrual period 09/17/2012, SpO2 99%.    HEENT: No thrush or ulcers Resp: Lungs clear bilaterally Cardio: Regular rate and rhythm GI: No hepatosplenomegaly, nontender Vascular: No leg edema  Skin: Mild hyperpigmentation of the hands  Portacath/PICC-without erythema  Lab Results:  Lab Results  Component Value Date   WBC 3.9 (L) 09/24/2023   HGB 9.7 (L) 09/24/2023   HCT 29.8 (L) 09/24/2023   MCV 100.3 (H) 09/24/2023   PLT 340 09/24/2023   NEUTROABS 2.3 09/24/2023    CMP  Lab Results  Component Value Date   NA 143 09/24/2023   K 3.2 (L) 09/24/2023   CL 110 09/24/2023   CO2 26 09/24/2023   GLUCOSE 85 09/24/2023   BUN 19 09/24/2023   CREATININE 0.87 09/24/2023   CALCIUM 8.7 (L) 09/24/2023   PROT 6.5 09/24/2023   ALBUMIN 3.3 (L) 09/24/2023   AST 19 09/24/2023   ALT 30 09/24/2023   ALKPHOS 251 (H) 09/24/2023   BILITOT 0.4 09/24/2023   GFRNONAA >60 09/24/2023   GFRAA >60 02/07/2020    Lab Results  Component Value Date   ZOX096 251 (H) 09/10/2023    Lab Results  Component Value Date   INR 1.0 01/27/2023   LABPROT 13.6 01/27/2023    Imaging:  No results found.  Medications: I have reviewed the  patient's current medications.   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 Tumor invades the duodenal wall and vascular groove, resection margins negative, 4/34 lymph nodes positive MSI-stable, tumor showed instability in 2 loci as did adjacent normal tissue Foundation 1-K-ras G12 V, microsatellite status and tumor mutation burden cannot be determined EUS FNA biopsy of pancreas mass on 07/03/2018-well-differentiated neuroendocrine tumor CTs 01/29/2019-ill-defined pancreas head mass, 5 pulmonary nodules-1 with a small amount of central cavitation, tumor abuts the left margin of the portal vein indistinct density surrounding, hepatic artery, complex cystic lesion of the right kidney, right adrenal mass-characterized as an adenoma on a [facility] MRI 12/21/2018 [facility] 03/03/2019-no focal pancreas activity, no tracer accumulation within the suspicious pulmonary nodules, left uterine mass with tracer accumulation felt to represent a leiomyoma Elevated preoperative CA 19-9--CA 19-9 186 on 01/18/2019 CT chest 04/16/2019-multiple bilateral pulmonary nodules, some with increased cavitation, stable in size Cycle 1 FOLFIRINOX 04/27/2019 Cycle 2 FOLFIRINOX 05/11/2019 Cycle 3 FOLFIRINOX 05/23/2019 Cycle 4 FOLFIRINOX 06/08/2019 Cycle 5 FOLFIRINOX 06/22/2019 CT chest 07/02/2019-stable size of bilateral pulmonary nodules.  Dominant cavitary lesions in both lungs show increased cavitation with thinner walls.  Stable 2.1 cm right adrenal nodule. Cycle 6 FOLFIRINOX 07/06/2019 Cycle 7 FOLFIRINOX 07/21/2019 Cycle 8 FOLFIRINOX 08/03/2019, oxaliplatin deleted secondary to neuropathy CT chest 08/24/2019-decreased size of several lung nodules with resolution of a left upper lobe nodule, no new nodules Radiation  to the pancreas surgical area with concurrent Xeloda 09/13/2019-10/20/2019  CTs 11/29/2019-multiple small pulmonary nodules scattered throughout the lungs bilaterally,  appear increased in number and size. No definite evidence of metastatic disease in the abdomen or pelvis. Markedly enlarged and heterogeneous appearing uterus, likely to represent multifocal fibroids. 1 of these lesions appears to be an exophytic subserosal fibroid in the posterior lateral aspect of the uterine body on the left side although this comes in close proximity to the left adnexa such that a primary ovarian lesion is difficult to completely exclude. CTs 02/07/2020-slight enlargement of bilateral lung nodules, some are cavitary, no evidence of metastatic disease in the abdomen or pelvis, stable right adrenal nodule, uterine fibroids CTs 04/26/2020-mild enlargement of pulmonary nodules, slight increase in trace pelvic fluid, new soft tissue thickening inferior to the cecal tip suspicious for peritoneal metastasis CT 05/26/2020-improved appearance of soft tissue at the inferior tip of the cecum, mildly thickened short appendix-findings suggestive of resolving appendicitis, stable small bibasilar pulmonary nodules, fibroids Plan biopsy of right cecal tip soft tissue canceled secondary to radiologic improvement CT chest 08/02/2020-enlargement and progressive cavitation of multiple bilateral lung nodules.  Some new nodules are present. CTs 10/24/2020- increase in size of pulmonary nodules, no new nodules, no evidence of metastatic disease in the abdomen, stable right adrenal nodule CT 01/09/2021-slight interval enlargement of pulmonary nodules, stable right adrenal nodule Navigation bronchoscopy 01/30/2021-left lower lobe cavitary nodule FNA-adenocarcinoma, brushing-adenocarcinoma.  Left lower lobe lavage-adenocarcinoma.  Right upper lobe brushing and FNA biopsy of cavitary nodule-adenocarcinoma-immunohistochemical profile consistent with pancreas adenocarcinoma Cycle 1 gemcitabine/Abraxane 03/28/2021 Cycle 2 gemcitabine/Abraxane 04/11/2021 Cycle 3 gemcitabine/Abraxane 04/25/2021 Cycle 4  gemcitabine/Abraxane 05/09/2021 Cycle 5 gemcitabine/Abraxane 05/23/2021 CT chest 06/05/2021-interval cavitation of multiple pulmonary nodules, some nodules have decreased in size, no new or enlarging nodules Cycle 6 gemcitabine/Abraxane 06/06/2021 Cycle 7 gemcitabine/Abraxane 06/21/2021 Cycle 8 gemcitabine/Abraxane 07/05/2021 Cycle 9 Gemcitabine/Abraxane 07/19/2021 Cycle 10 gemcitabine 08/01/2021-Abraxane held secondary to neuropathy CT chest 08/13/2021-mild decrease in size and wall thickness of multiple cavitary nodules, no new or progressive disease in the chest, indeterminate low-attenuation right liver lesions Cycle 11 gemcitabine 08/16/2021-Abraxane held secondary to neuropathy Cycle 12 gemcitabine 08/30/2021-Abraxane held secondary to neuropathy Cycle 13 gemcitabine 09/13/2021-Abraxane held secondary to neuropathy Cycle 14 gemcitabine 09/27/2021-Abraxane held secondary to neuropathy Cycle 15 gemcitabine 10/11/2021-Abraxane held secondary to neuropathy CTs 10/22/2021-no change in multiple cavitary lung nodules, no evidence of disease progression, ill-defined hypodense lesion in the posterior right liver suspicious for metastatic disease Cycle 16 gemcitabine 10/25/2021 Cycle 17 gemcitabine 11/08/2021 Cycle 18 Gemcitabine 11/22/2021 Cycle 19 gemcitabine 12/06/2021 Cycle 20 Gemcitabine 12/20/2021 CT 12/31/2021-mild increase in size of bilateral pulmonary metastases, stable subtle continuation right liver lesions Cycle 20 gemcitabine/Abraxane 01/03/2022 Cycle 21 gemcitabine/Abraxane 01/17/2022 Cycle 22 gemcitabine/Abraxane 01/31/2022 Cycle 23 gemcitabine/Abraxane 02/14/2022 Cycle 24 gemcitabine/Abraxane 02/28/2022 CTs 03/11/2022-widespread metastatic disease to the lungs again noted with slight involution of several of the pulmonary nodules, no definite new nodules noted; interval cavitation of solid lesion in the posterior aspect right lobe of the liver, no new liver lesions noted. Cycle 25 Gemcitabine/Abraxane  03/14/2022 Cycle 26 gemcitabine/Abraxane 03/28/2022 Cycle 27 gemcitabine/Abraxane 04/25/2022 Cycle 28 gemcitabine/Abraxane 05/09/2022 Cycle 29 Gemcitabine 05/23/2022, Abraxane held due to neuropathy CTs 06/04/2022-stable multifocal cavitary pulmonary nodules.  No definite new nodules.  No new nodules greater than a centimeter.  Decreased size of the right posterior hepatic lobe lesion.  Generalized heterogeneity and new focal areas of hypodensity in the liver. Cycle 30 Gemcitabine 06/06/2022, Abraxane held due to neuropathy Cycle 31 gemcitabine 06/20/2022, Abraxane held  due to neuropathy Cycle 32 gemcitabine 07/04/2022, Abraxane held due to neuropathy Cycle 33 gemcitabine  07/18/2022 ,Abraxane held due to neuropathy Cycle 34 gemcitabine 08/15/2022, Abraxane held due to neuropathy Cycle 35 gemcitabine 08/29/2022, Abraxane held due to neuropathy CTs 09/10/2022-enlarging pulmonary metastases.  Subtle poorly defined hypoattenuating lesions in the liver, less well-seen than on 06/04/2022.  Suspected new lesion in the dome of the right hepatic lobe Cycle 36 gemcitabine/Abraxane 09/12/2022 Cycle 37 gemcitabine/Abraxane 09/26/2022 Cycle 38 gemcitabine/Abraxane 10/10/2022 Cycle 39 gemcitabine/Abraxane 11/07/2022 Cycle 40 gemcitabine/Abraxane 11/21/2022 Cycle 41 gemcitabine/Abraxane 12/05/2022 Cycle 42 gemcitabine/Abraxane 12/19/2022 CT is 12/30/2022-new left hepatic lesion, stable bilateral pulmonary metastases Cycle 43 gemcitabine/Abraxane 01/02/2023 CT-guided biopsy of liver lesion 01/27/2023-acute/chronic inflammation, negative for malignancy, culture negative Cycle 44 gemcitabine/Abraxane 01/30/2023 Cycle 45 gemcitabine/Abraxane 02/13/2023 Cycle 46 gemcitabine/Abraxane 02/27/2023 Cycle 47 gemcitabine/Abraxane 03/13/2023 Cycle 48 gemcitabine/Abraxane 03/27/2023 CTs 04/21/2023-numerous solid and cavitary lung nodules overall similar, some slightly enlarged; multiple new and enlarging rim-enhancing liver lesions. Cycle 1  FOLFIRI with liposomal irinotecan 05/21/2023 Cycle 2 held 06/05/2023 due to neutropenia Cycle 2 FOLFIRI with liposomal irinotecan 06/18/2023, irinotecan dose reduced (white cell growth factor declined per patient) Cycle 3 FOLFIRI with liposomal irinotecan 07/02/2023 Cycle 4 FOLFIRI with liposomal irinotecan 07/16/2023 Cycle 5 FOLFIRI with liposomal irinotecan 07/30/2023 Cycle 6 FOLFIRI with liposomal irinotecan 08/13/2023 CTs 08/20/2023-numerous cavitary lesions seen in the lungs, extent and distribution appear similar.  A few lesions are slightly smaller.  Numerous liver lesions are now smaller.  Stable right adrenal nodule, adenoma favored. 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  Apixaban 10/24/2022 Port-A-Cath malfunction 08/01/2023.  Port-A-Cath removed and replaced 08/08/2023.      Disposition: Alyssa Ross appears stable.  There is no clinical evidence for progression of the metastatic pancreas cancer.  The CA 19-9 was lower on 09/10/2023.  She will complete another cycle of  5-FU/liposomal irinotecan today.  The etiology of the pruritus she experienced last week is unclear.  She does not have laboratory evidence of significant biliary obstruction at present.  She will call for recurrent pruritus or new symptoms.  She will return for an office visit and chemotherapy in 2 weeks.  Coni Deep, MD  09/24/2023  10:43 AM

## 2023-09-24 NOTE — Progress Notes (Signed)
 Patient seen by Dr. Coni Deep today  Vitals are within treatment parameters:Yes   Labs are within treatment parameters: No (Please specify and give further instructions.)  Mg+ 1.6 and K+ 3.2--OK to proceed  Treatment plan has been signed: Yes   Per physician team, Patient is ready for treatment. Please note the following modifications:  Will receive 20 meq KCl and 2 grams Mg+ IV today in addition to chemotherapy

## 2023-09-25 LAB — CANCER ANTIGEN 19-9: CA 19-9: 200 U/mL — ABNORMAL HIGH (ref 0–35)

## 2023-09-26 ENCOUNTER — Inpatient Hospital Stay

## 2023-09-26 VITALS — BP 119/79 | HR 77 | Temp 98.0°F | Resp 18

## 2023-09-26 DIAGNOSIS — Z9049 Acquired absence of other specified parts of digestive tract: Secondary | ICD-10-CM | POA: Diagnosis not present

## 2023-09-26 DIAGNOSIS — Z79899 Other long term (current) drug therapy: Secondary | ICD-10-CM | POA: Diagnosis not present

## 2023-09-26 DIAGNOSIS — C7801 Secondary malignant neoplasm of right lung: Secondary | ICD-10-CM | POA: Diagnosis not present

## 2023-09-26 DIAGNOSIS — Z5111 Encounter for antineoplastic chemotherapy: Secondary | ICD-10-CM | POA: Diagnosis not present

## 2023-09-26 DIAGNOSIS — C250 Malignant neoplasm of head of pancreas: Secondary | ICD-10-CM

## 2023-09-26 DIAGNOSIS — C7802 Secondary malignant neoplasm of left lung: Secondary | ICD-10-CM | POA: Diagnosis not present

## 2023-09-26 DIAGNOSIS — Z95828 Presence of other vascular implants and grafts: Secondary | ICD-10-CM

## 2023-09-26 DIAGNOSIS — Z85528 Personal history of other malignant neoplasm of kidney: Secondary | ICD-10-CM | POA: Diagnosis not present

## 2023-09-26 DIAGNOSIS — C3411 Malignant neoplasm of upper lobe, right bronchus or lung: Secondary | ICD-10-CM | POA: Diagnosis not present

## 2023-09-26 MED ORDER — SODIUM CHLORIDE 0.9% FLUSH
10.0000 mL | Freq: Once | INTRAVENOUS | Status: AC
  Administered 2023-09-26: 10 mL

## 2023-09-26 MED ORDER — HEPARIN SOD (PORK) LOCK FLUSH 100 UNIT/ML IV SOLN
500.0000 [IU] | Freq: Once | INTRAVENOUS | Status: AC
Start: 2023-09-26 — End: 2023-09-26
  Administered 2023-09-26: 500 [IU]

## 2023-10-05 ENCOUNTER — Other Ambulatory Visit: Payer: Self-pay | Admitting: Oncology

## 2023-10-08 ENCOUNTER — Encounter: Payer: Self-pay | Admitting: Nurse Practitioner

## 2023-10-08 ENCOUNTER — Encounter: Payer: Self-pay | Admitting: Oncology

## 2023-10-08 ENCOUNTER — Inpatient Hospital Stay

## 2023-10-08 ENCOUNTER — Inpatient Hospital Stay (HOSPITAL_BASED_OUTPATIENT_CLINIC_OR_DEPARTMENT_OTHER): Admitting: Nurse Practitioner

## 2023-10-08 VITALS — BP 114/82 | HR 72 | Temp 97.6°F | Resp 18 | Wt 185.8 lb

## 2023-10-08 DIAGNOSIS — C250 Malignant neoplasm of head of pancreas: Secondary | ICD-10-CM | POA: Diagnosis not present

## 2023-10-08 DIAGNOSIS — Z85528 Personal history of other malignant neoplasm of kidney: Secondary | ICD-10-CM | POA: Diagnosis not present

## 2023-10-08 DIAGNOSIS — C3411 Malignant neoplasm of upper lobe, right bronchus or lung: Secondary | ICD-10-CM | POA: Diagnosis not present

## 2023-10-08 DIAGNOSIS — C7801 Secondary malignant neoplasm of right lung: Secondary | ICD-10-CM | POA: Diagnosis not present

## 2023-10-08 DIAGNOSIS — E876 Hypokalemia: Secondary | ICD-10-CM | POA: Diagnosis not present

## 2023-10-08 DIAGNOSIS — Z95828 Presence of other vascular implants and grafts: Secondary | ICD-10-CM

## 2023-10-08 DIAGNOSIS — Z5111 Encounter for antineoplastic chemotherapy: Secondary | ICD-10-CM | POA: Diagnosis not present

## 2023-10-08 DIAGNOSIS — C259 Malignant neoplasm of pancreas, unspecified: Secondary | ICD-10-CM | POA: Diagnosis not present

## 2023-10-08 DIAGNOSIS — Z79899 Other long term (current) drug therapy: Secondary | ICD-10-CM | POA: Diagnosis not present

## 2023-10-08 DIAGNOSIS — Z9049 Acquired absence of other specified parts of digestive tract: Secondary | ICD-10-CM | POA: Diagnosis not present

## 2023-10-08 DIAGNOSIS — C7802 Secondary malignant neoplasm of left lung: Secondary | ICD-10-CM | POA: Diagnosis not present

## 2023-10-08 LAB — CBC WITH DIFFERENTIAL (CANCER CENTER ONLY)
Abs Immature Granulocytes: 0.01 10*3/uL (ref 0.00–0.07)
Basophils Absolute: 0 10*3/uL (ref 0.0–0.1)
Basophils Relative: 1 %
Eosinophils Absolute: 0.1 10*3/uL (ref 0.0–0.5)
Eosinophils Relative: 4 %
HCT: 29.6 % — ABNORMAL LOW (ref 36.0–46.0)
Hemoglobin: 9.5 g/dL — ABNORMAL LOW (ref 12.0–15.0)
Immature Granulocytes: 0 %
Lymphocytes Relative: 24 %
Lymphs Abs: 0.8 10*3/uL (ref 0.7–4.0)
MCH: 32.4 pg (ref 26.0–34.0)
MCHC: 32.1 g/dL (ref 30.0–36.0)
MCV: 101 fL — ABNORMAL HIGH (ref 80.0–100.0)
Monocytes Absolute: 0.4 10*3/uL (ref 0.1–1.0)
Monocytes Relative: 11 %
Neutro Abs: 2.1 10*3/uL (ref 1.7–7.7)
Neutrophils Relative %: 60 %
Platelet Count: 281 10*3/uL (ref 150–400)
RBC: 2.93 MIL/uL — ABNORMAL LOW (ref 3.87–5.11)
RDW: 13.9 % (ref 11.5–15.5)
WBC Count: 3.4 10*3/uL — ABNORMAL LOW (ref 4.0–10.5)
nRBC: 0 % (ref 0.0–0.2)

## 2023-10-08 LAB — CMP (CANCER CENTER ONLY)
ALT: 26 U/L (ref 0–44)
AST: 23 U/L (ref 15–41)
Albumin: 3.3 g/dL — ABNORMAL LOW (ref 3.5–5.0)
Alkaline Phosphatase: 267 U/L — ABNORMAL HIGH (ref 38–126)
Anion gap: 10 (ref 5–15)
BUN: 5 mg/dL — ABNORMAL LOW (ref 6–20)
CO2: 23 mmol/L (ref 22–32)
Calcium: 8.6 mg/dL — ABNORMAL LOW (ref 8.9–10.3)
Chloride: 108 mmol/L (ref 98–111)
Creatinine: 0.94 mg/dL (ref 0.44–1.00)
GFR, Estimated: >60 mL/min (ref >60)
Glucose, Bld: 185 mg/dL — ABNORMAL HIGH (ref 70–99)
Potassium: 3.1 mmol/L — ABNORMAL LOW (ref 3.5–5.1)
Sodium: 140 mmol/L (ref 135–145)
Total Bilirubin: 0.4 mg/dL (ref 0.0–1.2)
Total Protein: 6 g/dL — ABNORMAL LOW (ref 6.5–8.1)

## 2023-10-08 LAB — MAGNESIUM: Magnesium: 1.8 mg/dL (ref 1.7–2.4)

## 2023-10-08 MED ORDER — SODIUM CHLORIDE 0.9% FLUSH
10.0000 mL | Freq: Once | INTRAVENOUS | Status: AC
  Administered 2023-10-08: 10 mL

## 2023-10-08 MED ORDER — POTASSIUM CHLORIDE CRYS ER 20 MEQ PO TBCR: 20.0000 meq | EXTENDED_RELEASE_TABLET | Freq: Four times a day (QID) | ORAL | 1 refills | Status: DC

## 2023-10-08 MED ORDER — DEXAMETHASONE SODIUM PHOSPHATE 10 MG/ML IJ SOLN
10.0000 mg | Freq: Once | INTRAMUSCULAR | Status: AC
  Administered 2023-10-08: 10 mg via INTRAVENOUS
  Filled 2023-10-08: qty 1

## 2023-10-08 MED ORDER — PALONOSETRON HCL INJECTION 0.25 MG/5ML
0.2500 mg | Freq: Once | INTRAVENOUS | Status: AC
  Administered 2023-10-08: 0.25 mg via INTRAVENOUS
  Filled 2023-10-08: qty 5

## 2023-10-08 MED ORDER — SODIUM CHLORIDE 0.9 % IV SOLN
2400.0000 mg/m2 | INTRAVENOUS | Status: DC
  Administered 2023-10-08: 5000 mg via INTRAVENOUS
  Filled 2023-10-08: qty 100

## 2023-10-08 MED ORDER — ATROPINE SULFATE 1 MG/ML IV SOLN
0.5000 mg | Freq: Once | INTRAVENOUS | Status: AC | PRN
  Administered 2023-10-08: 0.5 mg via INTRAVENOUS
  Filled 2023-10-08: qty 1

## 2023-10-08 MED ORDER — SODIUM CHLORIDE 0.9 % IV SOLN
INTRAVENOUS | Status: DC
Start: 2023-10-08 — End: 2023-10-08

## 2023-10-08 MED ORDER — SODIUM CHLORIDE 0.9 % IV SOLN
400.0000 mg/m2 | Freq: Once | INTRAVENOUS | Status: AC
  Administered 2023-10-08: 776 mg via INTRAVENOUS
  Filled 2023-10-08: qty 38.8

## 2023-10-08 MED ORDER — SODIUM CHLORIDE 0.9 % IV SOLN
55.0000 mg/m2 | Freq: Once | INTRAVENOUS | Status: AC
  Administered 2023-10-08: 107.5 mg via INTRAVENOUS
  Filled 2023-10-08: qty 25

## 2023-10-08 NOTE — Progress Notes (Signed)
 Patient seen by Diana Forster NP today  Vitals are within treatment parameters:Yes   Labs are within treatment parameters: No (Please specify and give further instructions.) Potassium 3.1 and WBC 3.4. The patient was advised to increase oral potassium intake and was provided with a new prescription. Treatment plan has been signed: Yes   Per physician team, Patient is ready for treatment and there are NO modifications to the treatment plan.

## 2023-10-08 NOTE — Progress Notes (Signed)
 [facility] OFFICE PROGRESS NOTE   Diagnosis: Pancreas cancer  INTERVAL HISTORY:   [name] returns as scheduled.  She completed another cycle of 5-FU/liposomal irinotecan  09/24/2023.  She denies nausea/vomiting.  No mouth sores.  No significant diarrhea.  Continued neuropathy symptoms with no significant change.  No further episodes of pruritus.  Objective:  Vital signs in last 24 hours:  Blood pressure 114/82, pulse 72, temperature 97.6 F (36.4 C), temperature source Temporal, resp. rate 18, weight 185 lb 12.8 oz (84.3 kg), last menstrual period 09/17/2012, SpO2 100%.    HEENT: No thrush or ulcers.  Scattered areas of hyperpigmentation tongue and buccal regions. Resp: Lungs clear bilaterally. Cardio: Regular rate and rhythm. GI: Abdomen soft and nontender.  No hepatosplenomegaly. Vascular: No leg edema. Skin: Palms without erythema. Port-A-Cath without erythema.  Lab Results:  Lab Results  Component Value Date   WBC 3.4 (L) 10/08/2023   HGB 9.5 (L) 10/08/2023   HCT 29.6 (L) 10/08/2023   MCV 101.0 (H) 10/08/2023   PLT 281 10/08/2023   NEUTROABS 2.1 10/08/2023    Imaging:  No results found.  Medications: I have reviewed the patient's current medications.  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 Tumor invades the duodenal wall and vascular groove, resection margins negative, 4/34 lymph nodes positive MSI-stable, tumor showed instability in 2 loci as did adjacent normal tissue Foundation 1-K-ras G12 V, microsatellite status and tumor mutation burden cannot be determined EUS FNA biopsy of pancreas mass on 07/03/2018-well-differentiated neuroendocrine tumor CTs 01/29/2019-ill-defined pancreas head mass, 5 pulmonary nodules-1 with a small amount of central cavitation, tumor abuts the left margin of the portal vein indistinct density surrounding, hepatic artery, complex cystic lesion of the right  kidney, right adrenal mass-characterized as an adenoma on a [facility] MRI 12/21/2018 [facility]  03/03/2019-no focal pancreas activity, no tracer accumulation within the suspicious pulmonary nodules, left uterine mass with tracer accumulation felt to represent a leiomyoma Elevated preoperative CA 19-9--CA 19-9 186 on 01/18/2019 CT chest 04/16/2019-multiple bilateral pulmonary nodules, some with increased cavitation, stable in size Cycle 1 FOLFIRINOX 04/27/2019 Cycle 2 FOLFIRINOX 05/11/2019 Cycle 3 FOLFIRINOX 05/23/2019 Cycle 4 FOLFIRINOX 06/08/2019 Cycle 5 FOLFIRINOX 06/22/2019 CT chest 07/02/2019-stable size of bilateral pulmonary nodules.  Dominant cavitary lesions in both lungs show increased cavitation with thinner walls.  Stable 2.1 cm right adrenal nodule. Cycle 6 FOLFIRINOX 07/06/2019 Cycle 7 FOLFIRINOX 07/21/2019 Cycle 8 FOLFIRINOX 08/03/2019, oxaliplatin  deleted secondary to neuropathy CT chest 08/24/2019-decreased size of several lung nodules with resolution of a left upper lobe nodule, no new nodules Radiation to the pancreas surgical area with concurrent Xeloda  09/13/2019-10/20/2019  CTs 11/29/2019-multiple small pulmonary nodules scattered throughout the lungs bilaterally, appear increased in number and size. No definite evidence of metastatic disease in the abdomen or pelvis. Markedly enlarged and heterogeneous appearing uterus, likely to represent multifocal fibroids. 1 of these lesions appears to be an exophytic subserosal fibroid in the posterior lateral aspect of the uterine body on the left side although this comes in close proximity to the left adnexa such that a primary ovarian lesion is difficult to completely exclude. CTs 02/07/2020-slight enlargement of bilateral lung nodules, some are cavitary, no evidence of metastatic disease in the abdomen or pelvis, stable right adrenal nodule, uterine fibroids CTs 04/26/2020-mild enlargement of pulmonary nodules, slight increase in trace pelvic fluid, new  soft tissue thickening inferior to the cecal tip suspicious for peritoneal metastasis CT 05/26/2020-improved appearance of soft tissue at the inferior tip of the cecum, mildly  thickened short appendix-findings suggestive of resolving appendicitis, stable small bibasilar pulmonary nodules, fibroids Plan biopsy of right cecal tip soft tissue canceled secondary to radiologic improvement CT chest 08/02/2020-enlargement and progressive cavitation of multiple bilateral lung nodules.  Some new nodules are present. CTs 10/24/2020- increase in size of pulmonary nodules, no new nodules, no evidence of metastatic disease in the abdomen, stable right adrenal nodule CT 01/09/2021-slight interval enlargement of pulmonary nodules, stable right adrenal nodule Navigation bronchoscopy 01/30/2021-left lower lobe cavitary nodule FNA-adenocarcinoma, brushing-adenocarcinoma.  Left lower lobe lavage-adenocarcinoma.  Right upper lobe brushing and FNA biopsy of cavitary nodule-adenocarcinoma-immunohistochemical profile consistent with pancreas adenocarcinoma Cycle 1 gemcitabine /Abraxane  03/28/2021 Cycle 2 gemcitabine /Abraxane  04/11/2021 Cycle 3 gemcitabine /Abraxane  04/25/2021 Cycle 4 gemcitabine /Abraxane  05/09/2021 Cycle 5 gemcitabine /Abraxane  05/23/2021 CT chest 06/05/2021-interval cavitation of multiple pulmonary nodules, some nodules have decreased in size, no new or enlarging nodules Cycle 6 gemcitabine /Abraxane  06/06/2021 Cycle 7 gemcitabine /Abraxane  06/21/2021 Cycle 8 gemcitabine /Abraxane  07/05/2021 Cycle 9 Gemcitabine /Abraxane  07/19/2021 Cycle 10 gemcitabine  08/01/2021-Abraxane  held secondary to neuropathy CT chest 08/13/2021-mild decrease in size and wall thickness of multiple cavitary nodules, no new or progressive disease in the chest, indeterminate low-attenuation right liver lesions Cycle 11 gemcitabine  08/16/2021-Abraxane  held secondary to neuropathy Cycle 12 gemcitabine  08/30/2021-Abraxane  held secondary to  neuropathy Cycle 13 gemcitabine  09/13/2021-Abraxane  held secondary to neuropathy Cycle 14 gemcitabine  09/27/2021-Abraxane  held secondary to neuropathy Cycle 15 gemcitabine  10/11/2021-Abraxane  held secondary to neuropathy CTs 10/22/2021-no change in multiple cavitary lung nodules, no evidence of disease progression, ill-defined hypodense lesion in the posterior right liver suspicious for metastatic disease Cycle 16 gemcitabine  10/25/2021 Cycle 17 gemcitabine  11/08/2021 Cycle 18 Gemcitabine  11/22/2021 Cycle 19 gemcitabine  12/06/2021 Cycle 20 Gemcitabine  12/20/2021 CT 12/31/2021-mild increase in size of bilateral pulmonary metastases, stable subtle continuation right liver lesions Cycle 20 gemcitabine /Abraxane  01/03/2022 Cycle 21 gemcitabine /Abraxane  01/17/2022 Cycle 22 gemcitabine /Abraxane  01/31/2022 Cycle 23 gemcitabine /Abraxane  02/14/2022 Cycle 24 gemcitabine /Abraxane  02/28/2022 CTs 03/11/2022-widespread metastatic disease to the lungs again noted with slight involution of several of the pulmonary nodules, no definite new nodules noted; interval cavitation of solid lesion in the posterior aspect right lobe of the liver, no new liver lesions noted. Cycle 25 Gemcitabine /Abraxane  03/14/2022 Cycle 26 gemcitabine /Abraxane  03/28/2022 Cycle 27 gemcitabine /Abraxane  04/25/2022 Cycle 28 gemcitabine /Abraxane  05/09/2022 Cycle 29 Gemcitabine  05/23/2022, Abraxane  held due to neuropathy CTs 06/04/2022-stable multifocal cavitary pulmonary nodules.  No definite new nodules.  No new nodules greater than a centimeter.  Decreased size of the right posterior hepatic lobe lesion.  Generalized heterogeneity and new focal areas of hypodensity in the liver. Cycle 30 Gemcitabine  06/06/2022, Abraxane  held due to neuropathy Cycle 31 gemcitabine  06/20/2022, Abraxane  held due to neuropathy Cycle 32 gemcitabine  07/04/2022, Abraxane  held due to neuropathy Cycle 33 gemcitabine   07/18/2022 ,Abraxane  held due to neuropathy Cycle 34 gemcitabine   08/15/2022, Abraxane  held due to neuropathy Cycle 35 gemcitabine  08/29/2022, Abraxane  held due to neuropathy CTs 09/10/2022-enlarging pulmonary metastases.  Subtle poorly defined hypoattenuating lesions in the liver, less well-seen than on 06/04/2022.  Suspected new lesion in the dome of the right hepatic lobe Cycle 36 gemcitabine /Abraxane  09/12/2022 Cycle 37 gemcitabine /Abraxane  09/26/2022 Cycle 38 gemcitabine /Abraxane  10/10/2022 Cycle 39 gemcitabine /Abraxane  11/07/2022 Cycle 40 gemcitabine /Abraxane  11/21/2022 Cycle 41 gemcitabine /Abraxane  12/05/2022 Cycle 42 gemcitabine /Abraxane  12/19/2022 CT is 12/30/2022-new left hepatic lesion, stable bilateral pulmonary metastases Cycle 43 gemcitabine /Abraxane  01/02/2023 CT-guided biopsy of liver lesion 01/27/2023-acute/chronic inflammation, negative for malignancy, culture negative Cycle 44 gemcitabine /Abraxane  01/30/2023 Cycle 45 gemcitabine /Abraxane  02/13/2023 Cycle 46 gemcitabine /Abraxane  02/27/2023 Cycle 47 gemcitabine /Abraxane  03/13/2023 Cycle 48 gemcitabine /Abraxane  03/27/2023 CTs 04/21/2023-numerous solid and cavitary lung nodules overall similar, some slightly enlarged; multiple new and  enlarging rim-enhancing liver lesions. Cycle 1 FOLFIRI with liposomal irinotecan  05/21/2023 Cycle 2 held 06/05/2023 due to neutropenia Cycle 2 FOLFIRI with liposomal irinotecan  06/18/2023, irinotecan  dose reduced (white cell growth factor declined per patient) Cycle 3 FOLFIRI with liposomal irinotecan  07/02/2023 Cycle 4 FOLFIRI with liposomal irinotecan  07/16/2023 Cycle 5 FOLFIRI with liposomal irinotecan  07/30/2023 Cycle 6 FOLFIRI with liposomal irinotecan  08/13/2023 CTs 08/20/2023-numerous cavitary lesions seen in the lungs, extent and distribution appear similar.  A few lesions are slightly smaller.  Numerous liver lesions are now smaller.  Stable right adrenal nodule, adenoma favored. 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  Apixaban  10/24/2022 Port-A-Cath malfunction 08/01/2023.  Port-A-Cath removed and replaced 08/08/2023.      Disposition: Alyssa Ross appears stable.  She has completed 9 cycles of FOLFIRI with liposomal irinotecan .  She is tolerating treatment well.  There is no clinical evidence of disease progression.  Most recent CA 19-9 was further improved.  Plan to proceed with cycle 10 today as scheduled.  CBC and chemistry panel reviewed.  Labs adequate to proceed as above.  She has persistent hypokalemia.  She will increase oral potassium.  She will return for follow-up and treatment in 2 weeks.  We are available to see her sooner if needed.    Diana Forster ANP/GNP-BC   10/08/2023  9:54 AM

## 2023-10-09 LAB — CANCER ANTIGEN 19-9: CA 19-9: 156 U/mL — ABNORMAL HIGH (ref 0–35)

## 2023-10-10 ENCOUNTER — Inpatient Hospital Stay: Attending: Genetic Counselor

## 2023-10-10 DIAGNOSIS — C250 Malignant neoplasm of head of pancreas: Secondary | ICD-10-CM | POA: Insufficient documentation

## 2023-10-10 DIAGNOSIS — Z79899 Other long term (current) drug therapy: Secondary | ICD-10-CM | POA: Insufficient documentation

## 2023-10-10 DIAGNOSIS — E1140 Type 2 diabetes mellitus with diabetic neuropathy, unspecified: Secondary | ICD-10-CM | POA: Diagnosis not present

## 2023-10-10 DIAGNOSIS — C3411 Malignant neoplasm of upper lobe, right bronchus or lung: Secondary | ICD-10-CM | POA: Diagnosis not present

## 2023-10-10 DIAGNOSIS — Z9049 Acquired absence of other specified parts of digestive tract: Secondary | ICD-10-CM | POA: Diagnosis not present

## 2023-10-10 DIAGNOSIS — Z5111 Encounter for antineoplastic chemotherapy: Secondary | ICD-10-CM | POA: Diagnosis not present

## 2023-10-10 DIAGNOSIS — I10 Essential (primary) hypertension: Secondary | ICD-10-CM | POA: Diagnosis not present

## 2023-10-10 DIAGNOSIS — Z905 Acquired absence of kidney: Secondary | ICD-10-CM | POA: Diagnosis not present

## 2023-10-10 DIAGNOSIS — C7802 Secondary malignant neoplasm of left lung: Secondary | ICD-10-CM | POA: Diagnosis not present

## 2023-10-10 DIAGNOSIS — C7801 Secondary malignant neoplasm of right lung: Secondary | ICD-10-CM | POA: Diagnosis not present

## 2023-10-10 MED ORDER — SODIUM CHLORIDE 0.9% FLUSH
10.0000 mL | INTRAVENOUS | Status: DC | PRN
  Administered 2023-10-10: 10 mL

## 2023-10-10 MED ORDER — HEPARIN SOD (PORK) LOCK FLUSH 100 UNIT/ML IV SOLN
500.0000 [IU] | Freq: Once | INTRAVENOUS | Status: AC | PRN
  Administered 2023-10-10: 500 [IU]

## 2023-10-15 ENCOUNTER — Telehealth: Payer: Self-pay | Admitting: *Deleted

## 2023-10-15 ENCOUNTER — Encounter: Payer: Self-pay | Admitting: Oncology

## 2023-10-15 DIAGNOSIS — C250 Malignant neoplasm of head of pancreas: Secondary | ICD-10-CM

## 2023-10-15 NOTE — Telephone Encounter (Signed)
 Spoke with [name] re: intermittent cramping in legs/abdomen. No swelling or or discoloration in legs. Had good BM today. Back pain is dull ache at midback when she stands or sits up for prolonged period. Feels weak and lightheaded at times. Her BP has been fine. She confirms she is taking her magnesium  and potassium as prescribed.  Discussed with NP, Edwina Gram; Will come in tomorrow for labs via port and leave port accessed in case she needs intervention. She will tell infusion nurse if she thinks she needs to be seen.

## 2023-10-16 ENCOUNTER — Inpatient Hospital Stay

## 2023-10-16 VITALS — BP 133/78 | HR 101 | Temp 97.8°F | Resp 16

## 2023-10-16 DIAGNOSIS — C250 Malignant neoplasm of head of pancreas: Secondary | ICD-10-CM

## 2023-10-16 DIAGNOSIS — C7802 Secondary malignant neoplasm of left lung: Secondary | ICD-10-CM | POA: Diagnosis not present

## 2023-10-16 DIAGNOSIS — Z905 Acquired absence of kidney: Secondary | ICD-10-CM | POA: Diagnosis not present

## 2023-10-16 DIAGNOSIS — Z95828 Presence of other vascular implants and grafts: Secondary | ICD-10-CM

## 2023-10-16 DIAGNOSIS — I10 Essential (primary) hypertension: Secondary | ICD-10-CM | POA: Diagnosis not present

## 2023-10-16 DIAGNOSIS — C7801 Secondary malignant neoplasm of right lung: Secondary | ICD-10-CM | POA: Diagnosis not present

## 2023-10-16 DIAGNOSIS — E1140 Type 2 diabetes mellitus with diabetic neuropathy, unspecified: Secondary | ICD-10-CM | POA: Diagnosis not present

## 2023-10-16 DIAGNOSIS — Z5111 Encounter for antineoplastic chemotherapy: Secondary | ICD-10-CM | POA: Diagnosis not present

## 2023-10-16 DIAGNOSIS — Z79899 Other long term (current) drug therapy: Secondary | ICD-10-CM | POA: Diagnosis not present

## 2023-10-16 DIAGNOSIS — Z9049 Acquired absence of other specified parts of digestive tract: Secondary | ICD-10-CM | POA: Diagnosis not present

## 2023-10-16 DIAGNOSIS — C3411 Malignant neoplasm of upper lobe, right bronchus or lung: Secondary | ICD-10-CM | POA: Diagnosis not present

## 2023-10-16 LAB — CBC WITH DIFFERENTIAL (CANCER CENTER ONLY)
Abs Immature Granulocytes: 0 10*3/uL (ref 0.00–0.07)
Basophils Absolute: 0.1 10*3/uL (ref 0.0–0.1)
Basophils Relative: 2 %
Eosinophils Absolute: 0.1 10*3/uL (ref 0.0–0.5)
Eosinophils Relative: 3 %
HCT: 34.1 % — ABNORMAL LOW (ref 36.0–46.0)
Hemoglobin: 11.2 g/dL — ABNORMAL LOW (ref 12.0–15.0)
Immature Granulocytes: 0 %
Lymphocytes Relative: 33 %
Lymphs Abs: 1 10*3/uL (ref 0.7–4.0)
MCH: 32.8 pg (ref 26.0–34.0)
MCHC: 32.8 g/dL (ref 30.0–36.0)
MCV: 100 fL (ref 80.0–100.0)
Monocytes Absolute: 0.2 10*3/uL (ref 0.1–1.0)
Monocytes Relative: 7 %
Neutro Abs: 1.7 10*3/uL (ref 1.7–7.7)
Neutrophils Relative %: 55 %
Platelet Count: 291 10*3/uL (ref 150–400)
RBC: 3.41 MIL/uL — ABNORMAL LOW (ref 3.87–5.11)
RDW: 13.5 % (ref 11.5–15.5)
WBC Count: 3 10*3/uL — ABNORMAL LOW (ref 4.0–10.5)
nRBC: 0 % (ref 0.0–0.2)

## 2023-10-16 LAB — CMP (CANCER CENTER ONLY)
ALT: 31 U/L (ref 0–44)
AST: 29 U/L (ref 15–41)
Albumin: 3.7 g/dL (ref 3.5–5.0)
Alkaline Phosphatase: 292 U/L — ABNORMAL HIGH (ref 38–126)
Anion gap: 10 (ref 5–15)
BUN: 14 mg/dL (ref 6–20)
CO2: 25 mmol/L (ref 22–32)
Calcium: 9.3 mg/dL (ref 8.9–10.3)
Chloride: 101 mmol/L (ref 98–111)
Creatinine: 1.16 mg/dL — ABNORMAL HIGH (ref 0.44–1.00)
GFR, Estimated: 55 mL/min — ABNORMAL LOW (ref >60)
Glucose, Bld: 220 mg/dL — ABNORMAL HIGH (ref 70–99)
Potassium: 4.5 mmol/L (ref 3.5–5.1)
Sodium: 135 mmol/L (ref 135–145)
Total Bilirubin: 0.5 mg/dL (ref 0.0–1.2)
Total Protein: 7 g/dL (ref 6.5–8.1)

## 2023-10-16 LAB — MAGNESIUM: Magnesium: 2.1 mg/dL (ref 1.7–2.4)

## 2023-10-16 LAB — SAMPLE TO BLOOD BANK

## 2023-10-16 MED ORDER — HEPARIN SOD (PORK) LOCK FLUSH 100 UNIT/ML IV SOLN
500.0000 [IU] | Freq: Once | INTRAVENOUS | Status: AC
  Administered 2023-10-16: 500 [IU]

## 2023-10-16 MED ORDER — SODIUM CHLORIDE 0.9% FLUSH
10.0000 mL | Freq: Once | INTRAVENOUS | Status: AC
  Administered 2023-10-16: 10 mL

## 2023-10-16 MED ORDER — SODIUM CHLORIDE 0.9% FLUSH
10.0000 mL | Freq: Once | INTRAVENOUS | Status: AC
  Administered 2023-10-16: 10 mL via INTRAVENOUS

## 2023-10-16 NOTE — Patient Instructions (Addendum)
 Dr. Scherrie Curt reviewed labs and did not recommend any infusion treatment today but she should push fluids at home. Upon d/c from infusion she reports feeling better and she is going to work from here. Call for any new or worsening symptoms. She is also to monitor blood sugars. Vital signs were taken and reviewed with provider/nurse Amalia Badder). Pulse 100 but otherwise WNL.

## 2023-10-18 ENCOUNTER — Other Ambulatory Visit: Payer: Self-pay | Admitting: Oncology

## 2023-10-19 DIAGNOSIS — C259 Malignant neoplasm of pancreas, unspecified: Secondary | ICD-10-CM | POA: Diagnosis not present

## 2023-10-20 ENCOUNTER — Encounter: Payer: Self-pay | Admitting: Pharmacist

## 2023-10-20 ENCOUNTER — Telehealth: Payer: Self-pay | Admitting: Pharmacist

## 2023-10-20 DIAGNOSIS — Z794 Long term (current) use of insulin: Secondary | ICD-10-CM

## 2023-10-20 NOTE — Progress Notes (Unsigned)
 10/20/2023 Name: Alyssa Ross MRN: [id number] DOB: 28-Dec-1965  Chief Complaint  Patient presents with   Medication Management    Diabetes      Sander Stogner is a 58 y.o. year old female who presented for a telephone visit.   They were referred to the pharmacist by {referredtopharmacy:27270} for assistance in managing {referralreason:27271}.    Subjective:  Care Team: Primary Care Provider: Cleave Curling, MD ; Next Scheduled Visit: *** {careteamprovider:27366}  Medication Access/Adherence  Current Pharmacy:  [id number] [place], [place] - [address] AT [facility] OF [facility] & [address] [place] [address] Phone: [phone] Fax: [phone]  [facility] - [place], [place] - [address] [place] [address] Phone: [phone] Fax: [phone]  [facility]ode[facility] Sp[facility] - [facility] [address] [address] Phone: [phone] Fax: [phone]  [facility] - [place], [place] - [address] [facility] [address] [place] [address] Phone: [phone] Fax: [phone]  [facility] #[id number] - [place], [place] - [address] AT [facility] OF [address] & [address] [place] [address] Phone: [phone] Fax: [phone]   Patient reports affordability concerns with their medications: {YES/NO:21197} Patient reports access/transportation concerns to their pharmacy: {YES/NO:21197} Patient reports adherence concerns with their medications:  {YES/NO:21197} ***   Diabetes:  Current medications:  Medications tried in the past:   Current glucose readings:     Objective:  Lab Results  Component Value Date   HGBA1C 8.5 (H) 07/02/2023    Lab Results  Component Value Date   CREATININE 1.16 (H) 10/16/2023   BUN 14 10/16/2023   NA 135 10/16/2023   K 4.5 10/16/2023   CL 101 10/16/2023   CO2 25 10/16/2023     Lab Results  Component Value Date   CHOL 120 12/17/2022   HDL 44 12/17/2022   LDLCALC 62 12/17/2022   LDLDIRECT 97.7 02/11/2014   TRIG 68 12/17/2022   CHOLHDL 2.7 12/17/2022    Medications Reviewed Today     Reviewed by Geronimo Krabbe, RPH (Pharmacist) on 10/20/23 at 1818  Med List Status: <None>   Medication Order Taking? Sig Documenting Provider Last Dose Status Informant  apixaban  (ELIQUIS ) 5 MG TABS tablet [id number] Yes Take 1 tablet (5 mg total) by mouth 2 (two) times daily. Sumner Ends, MD Taking Active   cetirizine (ZYRTEC) 10 MG tablet [id number] Yes Take 10 mg by mouth daily. [provider] Taking Active   Continuous Blood Gluc Sensor (FREESTYLE LIBRE 2 SENSOR) MISC [id number] Yes APPLY EVERY 14 DAYS Lajean Pike, MD Taking Active Self  glucose blood test strip [id number] Yes Check sugar 2 times daily. Lajean Pike, MD Taking Active Self  hydrOXYzine  (ATARAX ) 10 MG tablet [id number] No Take 1 tablet (10 mg total) by mouth 3 (three) times daily as needed.  Patient not taking: Reported on 09/24/2023   Sumner Ends, MD Not Taking Active   Insulin  Degludec (TRESIBA  FLEXTOUCH Spray) [id number] Yes Inject into the skin. 12-18 Units [provider] Taking Active            Med Note (SHEPHERD, CRYSTAL L   Wed Sep 24, 2023  9:50 AM) 25 units for a few days w/ steroids    17 units non-steroid  insulin  lispro (HUMALOG  KWIKPEN) 200 UNIT/ML KwikPen [id number] Yes 6 units with breakfast, 13 units with lunch, 16 units with  dinner [provider] Taking Active            Med Note Georganna Kin Oct 20, 2023  6:04 PM) Reported that her dose varies- 14-20 units with meals  Insulin  Pen Needle (BD PEN NEEDLE NANO U/F) 32G X 4 MM MISC [id number] Yes USE TO INJECT INSULIN  THREE TIMES A Luane Rumps, MD Taking Active   lidocaine -prilocaine  (EMLA ) cream [id number] No Apply 1 Application topically as directed. Apply 1 hour prior to stick and cover with plastic wrap   Patient not taking: Reported on 09/24/2023   Roseline Conine, NP Not Taking Active   magic mouthwash (nystatin , diphenhydrAMINE , alum & mag hydroxide) suspension mixture [id number] No Take 5 mLs by mouth 4 (four) times daily as needed for mouth pain.  Patient not taking: Reported on 08/27/2023    Not Taking Active   magnesium  oxide (MAG-OX) 400 (240 Mg) MG tablet [id number] Yes TAKE 1 TABLET(400 MG) BY MOUTH TWICE DAILY Sumner Ends, MD Taking Active   ondansetron  (ZOFRAN ) 8 MG tablet [id number] No Take 1 tablet (8 mg total) by mouth every 8 (eight) hours as needed for nausea or vomiting (do not begin until 72 hours after chemo).  Patient not taking: Reported on 08/27/2023   Roseline Conine, NP Not Taking Active   potassium chloride  SA (KLOR-CON  M) 20 MEQ tablet [id number] Yes Take 1 tablet (20 mEq total) by mouth 4 (four) times daily. Roseline Conine, NP Taking Active   prochlorperazine  (COMPAZINE ) 10 MG tablet [id number] No TAKE 1 TABLET(10 MG) BY MOUTH EVERY 6 HOURS AS NEEDED FOR NAUSEA OR VOMITING  Patient not taking: Reported on 08/27/2023   Roseline Conine, NP Not Taking Active   rosuvastatin  (CRESTOR ) 10 MG tablet [id number] Yes TAKE 1 TABLET AT BEDTIME  Patient taking differently: Take 10 mg by mouth 3 (three) times a week.   Cleave Curling, MD Taking Active   spironolactone  (ALDACTONE ) 25 MG tablet [id number] Yes TAKE 1 TABLET(25 MG) BY MOUTH DAILY Cleave Curling, MD Taking Active            Med Note [facility], CATHERINE T   Thu Jul 03, 2023  2:08 PM) Takes when need based on fluid  valsartan  (DIOVAN ) 160 MG tablet [id number] Yes Take 1 tablet (160 mg total) by mouth daily. Cleave Curling, MD Taking Active            Med Note Acie Acosta, Raynald Calkins   Wed Jul 16, 2023  9:41 AM) PCP said to take 80 mg if needed              Assessment/Plan:   Diabetes: - Currently uncontrolled A1c from January 8.5% GMI per Freestyle Libre-9.8% - Reviewed long term cardiovascular and renal outcomes of  uncontrolled blood sugar - Reviewed goal A1c, goal fasting, and goal 2 hour post prandial glucose - Reviewed dietary modifications including decreasing sweets--Patient is on steroids with chemo treatment     Follow Up Plan: ***  ***

## 2023-10-22 ENCOUNTER — Inpatient Hospital Stay

## 2023-10-22 ENCOUNTER — Other Ambulatory Visit: Payer: Self-pay | Admitting: *Deleted

## 2023-10-22 ENCOUNTER — Inpatient Hospital Stay (HOSPITAL_BASED_OUTPATIENT_CLINIC_OR_DEPARTMENT_OTHER): Admitting: Oncology

## 2023-10-22 VITALS — BP 124/83 | HR 85 | Temp 98.1°F | Resp 18 | Ht 68.0 in | Wt 183.2 lb

## 2023-10-22 DIAGNOSIS — C250 Malignant neoplasm of head of pancreas: Secondary | ICD-10-CM

## 2023-10-22 DIAGNOSIS — C7801 Secondary malignant neoplasm of right lung: Secondary | ICD-10-CM | POA: Diagnosis not present

## 2023-10-22 DIAGNOSIS — Z9049 Acquired absence of other specified parts of digestive tract: Secondary | ICD-10-CM | POA: Diagnosis not present

## 2023-10-22 DIAGNOSIS — Z5111 Encounter for antineoplastic chemotherapy: Secondary | ICD-10-CM | POA: Diagnosis not present

## 2023-10-22 DIAGNOSIS — C259 Malignant neoplasm of pancreas, unspecified: Secondary | ICD-10-CM | POA: Diagnosis not present

## 2023-10-22 DIAGNOSIS — E1140 Type 2 diabetes mellitus with diabetic neuropathy, unspecified: Secondary | ICD-10-CM | POA: Diagnosis not present

## 2023-10-22 DIAGNOSIS — C3411 Malignant neoplasm of upper lobe, right bronchus or lung: Secondary | ICD-10-CM | POA: Diagnosis not present

## 2023-10-22 DIAGNOSIS — E876 Hypokalemia: Secondary | ICD-10-CM

## 2023-10-22 DIAGNOSIS — I10 Essential (primary) hypertension: Secondary | ICD-10-CM | POA: Diagnosis not present

## 2023-10-22 DIAGNOSIS — Z95828 Presence of other vascular implants and grafts: Secondary | ICD-10-CM

## 2023-10-22 DIAGNOSIS — C7802 Secondary malignant neoplasm of left lung: Secondary | ICD-10-CM | POA: Diagnosis not present

## 2023-10-22 DIAGNOSIS — Z905 Acquired absence of kidney: Secondary | ICD-10-CM | POA: Diagnosis not present

## 2023-10-22 DIAGNOSIS — Z79899 Other long term (current) drug therapy: Secondary | ICD-10-CM | POA: Diagnosis not present

## 2023-10-22 LAB — CMP (CANCER CENTER ONLY)
ALT: 27 U/L (ref 0–44)
AST: 23 U/L (ref 15–41)
Albumin: 3.4 g/dL — ABNORMAL LOW (ref 3.5–5.0)
Alkaline Phosphatase: 248 U/L — ABNORMAL HIGH (ref 38–126)
Anion gap: 8 (ref 5–15)
BUN: 5 mg/dL — ABNORMAL LOW (ref 6–20)
CO2: 24 mmol/L (ref 22–32)
Calcium: 8.8 mg/dL — ABNORMAL LOW (ref 8.9–10.3)
Chloride: 107 mmol/L (ref 98–111)
Creatinine: 0.95 mg/dL (ref 0.44–1.00)
GFR, Estimated: >60 mL/min (ref >60)
Glucose, Bld: 274 mg/dL — ABNORMAL HIGH (ref 70–99)
Potassium: 3.1 mmol/L — ABNORMAL LOW (ref 3.5–5.1)
Sodium: 139 mmol/L (ref 135–145)
Total Bilirubin: 0.4 mg/dL (ref 0.0–1.2)
Total Protein: 6.2 g/dL — ABNORMAL LOW (ref 6.5–8.1)

## 2023-10-22 LAB — CBC WITH DIFFERENTIAL (CANCER CENTER ONLY)
Abs Immature Granulocytes: 0.02 10*3/uL (ref 0.00–0.07)
Basophils Absolute: 0 10*3/uL (ref 0.0–0.1)
Basophils Relative: 1 %
Eosinophils Absolute: 0.1 10*3/uL (ref 0.0–0.5)
Eosinophils Relative: 4 %
HCT: 30.2 % — ABNORMAL LOW (ref 36.0–46.0)
Hemoglobin: 10.1 g/dL — ABNORMAL LOW (ref 12.0–15.0)
Immature Granulocytes: 1 %
Lymphocytes Relative: 25 %
Lymphs Abs: 0.9 10*3/uL (ref 0.7–4.0)
MCH: 33 pg (ref 26.0–34.0)
MCHC: 33.4 g/dL (ref 30.0–36.0)
MCV: 98.7 fL (ref 80.0–100.0)
Monocytes Absolute: 0.4 10*3/uL (ref 0.1–1.0)
Monocytes Relative: 12 %
Neutro Abs: 2 10*3/uL (ref 1.7–7.7)
Neutrophils Relative %: 57 %
Platelet Count: 334 10*3/uL (ref 150–400)
RBC: 3.06 MIL/uL — ABNORMAL LOW (ref 3.87–5.11)
RDW: 13.6 % (ref 11.5–15.5)
WBC Count: 3.4 10*3/uL — ABNORMAL LOW (ref 4.0–10.5)
nRBC: 0 % (ref 0.0–0.2)

## 2023-10-22 LAB — MAGNESIUM: Magnesium: 1.7 mg/dL (ref 1.7–2.4)

## 2023-10-22 MED ORDER — POTASSIUM CHLORIDE 10 MEQ/100ML IV SOLN
10.0000 meq | INTRAVENOUS | Status: AC
  Administered 2023-10-22 (×2): 10 meq via INTRAVENOUS
  Filled 2023-10-22: qty 100

## 2023-10-22 MED ORDER — LEUCOVORIN CALCIUM INJECTION 350 MG
400.0000 mg/m2 | Freq: Once | INTRAMUSCULAR | Status: AC
  Administered 2023-10-22: 776 mg via INTRAVENOUS
  Filled 2023-10-22: qty 38.8

## 2023-10-22 MED ORDER — DEXAMETHASONE SODIUM PHOSPHATE 10 MG/ML IJ SOLN
10.0000 mg | Freq: Once | INTRAMUSCULAR | Status: AC
  Administered 2023-10-22: 10 mg via INTRAVENOUS
  Filled 2023-10-22: qty 1

## 2023-10-22 MED ORDER — SODIUM CHLORIDE 0.9 % IV SOLN
2400.0000 mg/m2 | INTRAVENOUS | Status: DC
  Administered 2023-10-22: 5000 mg via INTRAVENOUS
  Filled 2023-10-22: qty 100

## 2023-10-22 MED ORDER — SODIUM CHLORIDE 0.9% FLUSH
10.0000 mL | Freq: Once | INTRAVENOUS | Status: AC
  Administered 2023-10-22: 10 mL

## 2023-10-22 MED ORDER — PALONOSETRON HCL INJECTION 0.25 MG/5ML
0.2500 mg | Freq: Once | INTRAVENOUS | Status: AC
Start: 2023-10-22 — End: 2023-10-22
  Administered 2023-10-22: 0.25 mg via INTRAVENOUS
  Filled 2023-10-22: qty 5

## 2023-10-22 MED ORDER — ATROPINE SULFATE 1 MG/ML IV SOLN
0.5000 mg | Freq: Once | INTRAVENOUS | Status: AC | PRN
  Administered 2023-10-22: 0.5 mg via INTRAVENOUS
  Filled 2023-10-22: qty 1

## 2023-10-22 MED ORDER — SODIUM CHLORIDE 0.9 % IV SOLN: INTRAVENOUS | Status: DC

## 2023-10-22 MED ORDER — SODIUM CHLORIDE 0.9 % IV SOLN
55.0000 mg/m2 | Freq: Once | INTRAVENOUS | Status: AC
  Administered 2023-10-22: 107.5 mg via INTRAVENOUS
  Filled 2023-10-22: qty 25

## 2023-10-22 NOTE — Patient Instructions (Signed)
 [facility] - A DEPT OF [facility]   Discharge Instructions: The chemotherapy medication bag should finish at 46 hours, 96 hours, or 7 days. For example, if your pump is scheduled for 46 hours and it was put on at 4:00 p.m., it should finish at 2:00 p.m. the day it is scheduled to come off regardless of your appointment time.     Estimated time to finish at 1:15 FRIDAY, Oct 24, 2023.   If the display on your pump reads "Low Volume" and it is beeping, take the batteries out of the pump and come to the cancer center for it to be taken off.   If the pump alarms go off prior to the pump reading "Low Volume" then call [phone] and someone can assist you.  If the plunger comes out and the chemotherapy medication is leaking out, please use your home chemo spill kit to clean up the spill. Do NOT use paper towels or other household products.  If you have problems or questions regarding your pump, please call either [phone] (24 hours a day) or the cancer center Monday-Friday 8:00 a.m.- 4:30 p.m. at the clinic number and we will assist you. If you are unable to get assistance, then go to the nearest Emergency Department and ask the staff to contact the IV team for assistance.   Thank you for choosing [facility] to provide your oncology and hematology care.   If you have a lab appointment with the Cancer Center, please go directly to the Cancer Center and check in at the registration area.   Wear comfortable clothing and clothing appropriate for easy access to any Portacath or PICC line.   We strive to give you quality time with your provider. You may need to reschedule your appointment if you arrive late (15 or more minutes).  Arriving late affects you and other patients whose appointments are after yours.  Also, if you miss three or more appointments without notifying the office, you may be dismissed from the clinic at the provider's discretion.       For prescription refill requests, have your pharmacy contact our office and allow 72 hours for refills to be completed.    Today you received the following chemotherapy and/or immunotherapy agents IRINOTECAN  LIPOSOME, LEUCOVORIN , FLUOROURACIL        To help prevent nausea and vomiting after your treatment, we encourage you to take your nausea medication as directed.  BELOW ARE SYMPTOMS THAT SHOULD BE REPORTED IMMEDIATELY: *FEVER GREATER THAN 100.4 F (38 C) OR HIGHER *CHILLS OR SWEATING *NAUSEA AND VOMITING THAT IS NOT CONTROLLED WITH YOUR NAUSEA MEDICATION *UNUSUAL SHORTNESS OF BREATH *UNUSUAL BRUISING OR BLEEDING *URINARY PROBLEMS (pain or burning when urinating, or frequent urination) *BOWEL PROBLEMS (unusual diarrhea, constipation, pain near the anus) TENDERNESS IN MOUTH AND THROAT WITH OR WITHOUT PRESENCE OF ULCERS (sore throat, sores in mouth, or a toothache) UNUSUAL RASH, SWELLING OR PAIN  UNUSUAL VAGINAL DISCHARGE OR ITCHING   Items with * indicate a potential emergency and should be followed up as soon as possible or go to the Emergency Department if any problems should occur.  Please show the CHEMOTHERAPY ALERT CARD or IMMUNOTHERAPY ALERT CARD at check-in to the Emergency Department and triage nurse.  Should you have questions after your visit or need to cancel or reschedule your appointment, please contact [facility] - A DEPT OF [facility]  Dept: [phone]  and follow the  prompts.  Office hours are 8:00 a.m. to 4:30 p.m. Monday - Friday. Please note that voicemails left after 4:00 p.m. may not be returned until the following business day.  We are closed weekends and major holidays. You have access to a nurse at all times for urgent questions. Please call the main number to the clinic Dept: [phone] and follow the prompts.   For any non-urgent questions, you may also contact your provider using MyChart. We now offer e-Visits for  anyone 38 and older to request care online for non-urgent symptoms. For details visit [email or web address].   Also download the MyChart app! Go to the app store, search "MyChart", open the app, select [facility], and log in with your MyChart username and password.  Irinotecan  Liposome Injection What is this medication? IRINOTECAN  LIPOSOME (eye ri noe TEE kan LIP oh som) treats pancreatic cancer. It works by slowing down the growth of cancer cells. This medicine may be used for other purposes; ask your health care provider or pharmacist if you have questions. COMMON BRAND NAME(S): ONIVYDE  What should I tell my care team before I take this medication? They need to know if you have any of these conditions: Blockage in your bowels Dehydration Infection Low white blood cell levels Lung disease An unusual or allergic reaction to irinotecan  liposome, irinotecan , other medications, foods, dyes, or preservatives Pregnant or trying to get pregnant Breast-feeding How should I use this medication? This medication is injected into a vein. It is given by your care team in a hospital or clinic setting. Talk to your care team about the use of this medication in children. Special care may be needed. Overdosage: If you think you have taken too much of this medicine contact a poison control center or emergency room at once. NOTE: This medicine is only for you. Do not share this medicine with others. What if I miss a dose? Keep appointments for follow-up doses. It is important not to miss your dose. Call your care team if you are unable to keep an appointment. What may interact with this medication? Do not take this medication with any of the following: Itraconazole This medication may also interact with the following: Certain antivirals for HIV or AIDS Certain medications for seizures, such as carbamazepine, fosphenytoin, phenytoin,  phenobarbital Clarithromycin Gemfibrozil Nefazodone Rifabutin Rifampin Rifapentine St. John's Wort Voriconazole This list may not describe all possible interactions. Give your health care provider a list of all the medicines, herbs, non-prescription drugs, or dietary supplements you use. Also tell them if you smoke, drink alcohol, or use illegal drugs. Some items may interact with your medicine. What should I watch for while using this medication? This medication may make you feel generally unwell. This is not uncommon as chemotherapy can affect healthy cells as well as cancer cells. Report any side effects. Continue your course of treatment even though you feel ill unless your care team tells you to stop. You may need blood work while you are taking this medication. This medication can cause serious side effects and allergic reactions. To reduce your risk, your care team may give you other medications to take before receiving this one. Be sure to follow the directions from your care team. Check with your care team if you get an attack of diarrhea, nausea and vomiting, or if you sweat a lot. The loss of too much body fluid can make it dangerous for you to take this medication. This medication may cause infertility. Talk to your care team  if you are concerned about your fertility. Talk to your care team if you wish to become pregnant or if you think you might be pregnant. This medication can cause serious birth defects if taken during pregnancy or if you get pregnant within 7 months after stopping therapy. A negative pregnancy test is required before starting this medication. A reliable form of contraception is recommended while taking this medication and for 7 months after stopping it. Talk to your care team about reliable forms of contraception. Use a condom during sex and for 4 months after stopping therapy. Tell your care team right away if you think your partner might be pregnant. This  medication can cause serious birth defects. Do not breast-feed while taking this medication and for 1 month after stopping therapy. This medication may increase your risk of getting an infection. Call your care team for advice if you get a fever, chills, sore throat, or other symptoms of a cold or flu. Do not treat yourself. Try to avoid being around people who are sick. Avoid taking medications that contain aspirin, acetaminophen , ibuprofen, naproxen, or ketoprofen unless instructed by your care team. These medications may hide a fever. Be careful brushing or flossing your teeth or using a toothpick because you may get an infection or bleed more easily. If you have any dental work done, tell your dentist you are receiving this medication. What side effects may I notice from receiving this medication? Side effects that you should report to your care team as soon as possible: Allergic reactions or angioedema--skin rash, itching or hives, swelling of the face, eyes, lips, tongue, arms, or legs, trouble swallowing or breathing Dry cough, shortness of breath or trouble breathing Diarrhea Infection--fever, chills, cough, or sore throat Side effects that usually do not require medical attention (report to your care team if they continue or are bothersome): Fatigue Loss of appetite Nausea Pain, redness, or swelling with sores inside the mouth or throat Vomiting This list may not describe all possible side effects. Call your doctor for medical advice about side effects. You may report side effects to FDA at 1-800-FDA-1088. Where should I keep my medication? This medication is given in a hospital or clinic. It will not be stored at home. NOTE: This sheet is a summary. It may not cover all possible information. If you have questions about this medicine, talk to your doctor, pharmacist, or health care provider.  2024 Elsevier/Gold Standard (2021-07-26 00:00:00)  Leucovorin  Injection What is this  medication? LEUCOVORIN  (loo koe VOR in) prevents side effects from certain medications, such as methotrexate. It works by increasing folate levels. This helps protect healthy cells in your body. It may also be used to treat anemia caused by low levels of folate. It can also be used with fluorouracil , a type of chemotherapy, to treat colorectal cancer. It works by increasing the effects of fluorouracil  in the body. This medicine may be used for other purposes; ask your health care provider or pharmacist if you have questions. What should I tell my care team before I take this medication? They need to know if you have any of these conditions: Anemia from low levels of vitamin B12 in the blood An unusual or allergic reaction to leucovorin , folic acid, other medications, foods, dyes, or preservatives Pregnant or trying to get pregnant Breastfeeding How should I use this medication? This medication is injected into a vein or a muscle. It is given by your care team in a hospital or clinic setting. Talk  to your care team about the use of this medication in children. Special care may be needed. Overdosage: If you think you have taken too much of this medicine contact a poison control center or emergency room at once. NOTE: This medicine is only for you. Do not share this medicine with others. What if I miss a dose? Keep appointments for follow-up doses. It is important not to miss your dose. Call your care team if you are unable to keep an appointment. What may interact with this medication? Capecitabine  Fluorouracil  Phenobarbital Phenytoin Primidone Trimethoprim;sulfamethoxazole This list may not describe all possible interactions. Give your health care provider a list of all the medicines, herbs, non-prescription drugs, or dietary supplements you use. Also tell them if you smoke, drink alcohol, or use illegal drugs. Some items may interact with your medicine. What should I watch for while using  this medication? Your condition will be monitored carefully while you are receiving this medication. This medication may increase the side effects of 5-fluorouracil . Tell your care team if you have diarrhea or mouth sores that do not get better or that get worse. What side effects may I notice from receiving this medication? Side effects that you should report to your care team as soon as possible: Allergic reactions--skin rash, itching, hives, swelling of the face, lips, tongue, or throat This list may not describe all possible side effects. Call your doctor for medical advice about side effects. You may report side effects to FDA at 1-800-FDA-1088. Where should I keep my medication? This medication is given in a hospital or clinic. It will not be stored at home. NOTE: This sheet is a summary. It may not cover all possible information. If you have questions about this medicine, talk to your doctor, pharmacist, or health care provider.  2024 Elsevier/Gold Standard (2021-10-30 00:00:00  Fluorouracil  Injection What is this medication? FLUOROURACIL  (flure oh YOOR a sil) treats some types of cancer. It works by slowing down the growth of cancer cells. This medicine may be used for other purposes; ask your health care provider or pharmacist if you have questions. COMMON BRAND NAME(S): Adrucil  What should I tell my care team before I take this medication? They need to know if you have any of these conditions: Blood disorders Dihydropyrimidine dehydrogenase (DPD) deficiency Infection, such as chickenpox, cold sores, herpes Kidney disease Liver disease Poor nutrition Recent or ongoing radiation therapy An unusual or allergic reaction to fluorouracil , other medications, foods, dyes, or preservatives If you or your partner are pregnant or trying to get pregnant Breast-feeding How should I use this medication? This medication is injected into a vein. It is administered by your care team in a  hospital or clinic setting. Talk to your care team about the use of this medication in children. Special care may be needed. Overdosage: If you think you have taken too much of this medicine contact a poison control center or emergency room at once. NOTE: This medicine is only for you. Do not share this medicine with others. What if I miss a dose? Keep appointments for follow-up doses. It is important not to miss your dose. Call your care team if you are unable to keep an appointment. What may interact with this medication? Do not take this medication with any of the following: Live virus vaccines This medication may also interact with the following: Medications that treat or prevent blood clots, such as warfarin, enoxaparin , dalteparin This list may not describe all possible interactions. Give your health  care provider a list of all the medicines, herbs, non-prescription drugs, or dietary supplements you use. Also tell them if you smoke, drink alcohol, or use illegal drugs. Some items may interact with your medicine. What should I watch for while using this medication? Your condition will be monitored carefully while you are receiving this medication. This medication may make you feel generally unwell. This is not uncommon as chemotherapy can affect healthy cells as well as cancer cells. Report any side effects. Continue your course of treatment even though you feel ill unless your care team tells you to stop. In some cases, you may be given additional medications to help with side effects. Follow all directions for their use. This medication may increase your risk of getting an infection. Call your care team for advice if you get a fever, chills, sore throat, or other symptoms of a cold or flu. Do not treat yourself. Try to avoid being around people who are sick. This medication may increase your risk to bruise or bleed. Call your care team if you notice any unusual bleeding. Be careful brushing  or flossing your teeth or using a toothpick because you may get an infection or bleed more easily. If you have any dental work done, tell your dentist you are receiving this medication. Avoid taking medications that contain aspirin, acetaminophen , ibuprofen, naproxen, or ketoprofen unless instructed by your care team. These medications may hide a fever. Do not treat diarrhea with over the counter products. Contact your care team if you have diarrhea that lasts more than 2 days or if it is severe and watery. This medication can make you more sensitive to the sun. Keep out of the sun. If you cannot avoid being in the sun, wear protective clothing and sunscreen. Do not use sun lamps, tanning beds, or tanning booths. Talk to your care team if you or your partner wish to become pregnant or think you might be pregnant. This medication can cause serious birth defects if taken during pregnancy and for 3 months after the last dose. A reliable form of contraception is recommended while taking this medication and for 3 months after the last dose. Talk to your care team about effective forms of contraception. Do not father a child while taking this medication and for 3 months after the last dose. Use a condom while having sex during this time period. Do not breastfeed while taking this medication. This medication may cause infertility. Talk to your care team if you are concerned about your fertility. What side effects may I notice from receiving this medication? Side effects that you should report to your care team as soon as possible: Allergic reactions--skin rash, itching, hives, swelling of the face, lips, tongue, or throat Heart attack--pain or tightness in the chest, shoulders, arms, or jaw, nausea, shortness of breath, cold or clammy skin, feeling faint or lightheaded Heart failure--shortness of breath, swelling of the ankles, feet, or hands, sudden weight gain, unusual weakness or fatigue Heart rhythm  changes--fast or irregular heartbeat, dizziness, feeling faint or lightheaded, chest pain, trouble breathing High ammonia level--unusual weakness or fatigue, confusion, loss of appetite, nausea, vomiting, seizures Infection--fever, chills, cough, sore throat, wounds that don't heal, pain or trouble when passing urine, general feeling of discomfort or being unwell Low red blood cell level--unusual weakness or fatigue, dizziness, headache, trouble breathing Pain, tingling, or numbness in the hands or feet, muscle weakness, change in vision, confusion or trouble speaking, loss of balance or coordination, trouble walking, seizures  Redness, swelling, and blistering of the skin over hands and feet Severe or prolonged diarrhea Unusual bruising or bleeding Side effects that usually do not require medical attention (report to your care team if they continue or are bothersome): Dry skin Headache Increased tears Nausea Pain, redness, or swelling with sores inside the mouth or throat Sensitivity to light Vomiting This list may not describe all possible side effects. Call your doctor for medical advice about side effects. You may report side effects to FDA at 1-800-FDA-1088. Where should I keep my medication? This medication is given in a hospital or clinic. It will not be stored at home. NOTE: This sheet is a summary. It may not cover all possible information. If you have questions about this medicine, talk to your doctor, pharmacist, or health care provider.  2024 Elsevier/Gold Standard (2021-10-02 00:00:00)

## 2023-10-22 NOTE — Progress Notes (Signed)
 Patient seen by Dr. Coni Deep today  Vitals are within treatment parameters:Yes   Labs are within treatment parameters: No (Please specify and give further instructions.) K+ 3.1--ok to proceed  Treatment plan has been signed: Yes   Per physician team, Patient is ready for treatment. Please note the following modifications: Administer 20 meq IV K+ today (orders are in)

## 2023-10-22 NOTE — Progress Notes (Signed)
 [facility] OFFICE PROGRESS NOTE   Diagnosis: Pancreas cancer  INTERVAL HISTORY:   Ms. Scaturro completed another cycle of 5-FU/liposomal irinotecan  on 10/08/2023.  No nausea/vomiting, mouth sores, or diarrhea.  She had cramping in the legs and abdomen last week.  She feels constipated and has "hemorrhoid "bleeding when constipated.  Objective:  Vital signs in last 24 hours:  Blood pressure 124/83, pulse 85, temperature 98.1 F (36.7 C), temperature source Temporal, resp. rate 18, height 5\' 8"  (1.727 m), weight 183 lb 3.2 oz (83.1 kg), last menstrual period 09/17/2012, SpO2 100%.    HEENT: No thrush or ulcers, hyperpigmentation Resp: Lungs clear bilaterally Cardio: Regular rate and rhythm GI: No hepatosplenomegaly, nontender Vascular: No leg edema     Portacath/PICC-without erythema  Lab Results:  Lab Results  Component Value Date   WBC 3.4 (L) 10/22/2023   HGB 10.1 (L) 10/22/2023   HCT 30.2 (L) 10/22/2023   MCV 98.7 10/22/2023   PLT 334 10/22/2023   NEUTROABS 2.0 10/22/2023    CMP  Lab Results  Component Value Date   NA 135 10/16/2023   K 4.5 10/16/2023   CL 101 10/16/2023   CO2 25 10/16/2023   GLUCOSE 220 (H) 10/16/2023   BUN 14 10/16/2023   CREATININE 1.16 (H) 10/16/2023   CALCIUM  9.3 10/16/2023   PROT 7.0 10/16/2023   ALBUMIN  3.7 10/16/2023   AST 29 10/16/2023   ALT 31 10/16/2023   ALKPHOS 292 (H) 10/16/2023   BILITOT 0.5 10/16/2023   GFRNONAA 55 (L) 10/16/2023   GFRAA >60 02/07/2020    Lab Results  Component Value Date   VHQ469 156 (H) 10/08/2023    Medications: I have reviewed the patient's current medications.   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 Tumor invades the duodenal wall and vascular groove, resection margins negative, 4/34 lymph nodes positive MSI-stable, tumor showed instability in 2 loci as did adjacent normal tissue Foundation 1-K-ras G12 V,  microsatellite status and tumor mutation burden cannot be determined EUS FNA biopsy of pancreas mass on 07/03/2018-well-differentiated neuroendocrine tumor CTs 01/29/2019-ill-defined pancreas head mass, 5 pulmonary nodules-1 with a small amount of central cavitation, tumor abuts the left margin of the portal vein indistinct density surrounding, hepatic artery, complex cystic lesion of the right kidney, right adrenal mass-characterized as an adenoma on a [facility] MRI 12/21/2018 [facility]  03/03/2019-no focal pancreas activity, no tracer accumulation within the suspicious pulmonary nodules, left uterine mass with tracer accumulation felt to represent a leiomyoma Elevated preoperative CA 19-9--CA 19-9 186 on 01/18/2019 CT chest 04/16/2019-multiple bilateral pulmonary nodules, some with increased cavitation, stable in size Cycle 1 FOLFIRINOX 04/27/2019 Cycle 2 FOLFIRINOX 05/11/2019 Cycle 3 FOLFIRINOX 05/23/2019 Cycle 4 FOLFIRINOX 06/08/2019 Cycle 5 FOLFIRINOX 06/22/2019 CT chest 07/02/2019-stable size of bilateral pulmonary nodules.  Dominant cavitary lesions in both lungs show increased cavitation with thinner walls.  Stable 2.1 cm right adrenal nodule. Cycle 6 FOLFIRINOX 07/06/2019 Cycle 7 FOLFIRINOX 07/21/2019 Cycle 8 FOLFIRINOX 08/03/2019, oxaliplatin  deleted secondary to neuropathy CT chest 08/24/2019-decreased size of several lung nodules with resolution of a left upper lobe nodule, no new nodules Radiation to the pancreas surgical area with concurrent Xeloda  09/13/2019-10/20/2019  CTs 11/29/2019-multiple small pulmonary nodules scattered throughout the lungs bilaterally, appear increased in number and size. No definite evidence of metastatic disease in the abdomen or pelvis. Markedly enlarged and heterogeneous appearing uterus, likely to represent multifocal fibroids. 1 of these lesions appears to be an exophytic subserosal fibroid in the posterior lateral aspect of  the uterine body on the left side although this  comes in close proximity to the left adnexa such that a primary ovarian lesion is difficult to completely exclude. CTs 02/07/2020-slight enlargement of bilateral lung nodules, some are cavitary, no evidence of metastatic disease in the abdomen or pelvis, stable right adrenal nodule, uterine fibroids CTs 04/26/2020-mild enlargement of pulmonary nodules, slight increase in trace pelvic fluid, new soft tissue thickening inferior to the cecal tip suspicious for peritoneal metastasis CT 05/26/2020-improved appearance of soft tissue at the inferior tip of the cecum, mildly thickened short appendix-findings suggestive of resolving appendicitis, stable small bibasilar pulmonary nodules, fibroids Plan biopsy of right cecal tip soft tissue canceled secondary to radiologic improvement CT chest 08/02/2020-enlargement and progressive cavitation of multiple bilateral lung nodules.  Some new nodules are present. CTs 10/24/2020- increase in size of pulmonary nodules, no new nodules, no evidence of metastatic disease in the abdomen, stable right adrenal nodule CT 01/09/2021-slight interval enlargement of pulmonary nodules, stable right adrenal nodule Navigation bronchoscopy 01/30/2021-left lower lobe cavitary nodule FNA-adenocarcinoma, brushing-adenocarcinoma.  Left lower lobe lavage-adenocarcinoma.  Right upper lobe brushing and FNA biopsy of cavitary nodule-adenocarcinoma-immunohistochemical profile consistent with pancreas adenocarcinoma Cycle 1 gemcitabine /Abraxane  03/28/2021 Cycle 2 gemcitabine /Abraxane  04/11/2021 Cycle 3 gemcitabine /Abraxane  04/25/2021 Cycle 4 gemcitabine /Abraxane  05/09/2021 Cycle 5 gemcitabine /Abraxane  05/23/2021 CT chest 06/05/2021-interval cavitation of multiple pulmonary nodules, some nodules have decreased in size, no new or enlarging nodules Cycle 6 gemcitabine /Abraxane  06/06/2021 Cycle 7 gemcitabine /Abraxane  06/21/2021 Cycle 8 gemcitabine /Abraxane  07/05/2021 Cycle 9 Gemcitabine /Abraxane   07/19/2021 Cycle 10 gemcitabine  08/01/2021-Abraxane  held secondary to neuropathy CT chest 08/13/2021-mild decrease in size and wall thickness of multiple cavitary nodules, no new or progressive disease in the chest, indeterminate low-attenuation right liver lesions Cycle 11 gemcitabine  08/16/2021-Abraxane  held secondary to neuropathy Cycle 12 gemcitabine  08/30/2021-Abraxane  held secondary to neuropathy Cycle 13 gemcitabine  09/13/2021-Abraxane  held secondary to neuropathy Cycle 14 gemcitabine  09/27/2021-Abraxane  held secondary to neuropathy Cycle 15 gemcitabine  10/11/2021-Abraxane  held secondary to neuropathy CTs 10/22/2021-no change in multiple cavitary lung nodules, no evidence of disease progression, ill-defined hypodense lesion in the posterior right liver suspicious for metastatic disease Cycle 16 gemcitabine  10/25/2021 Cycle 17 gemcitabine  11/08/2021 Cycle 18 Gemcitabine  11/22/2021 Cycle 19 gemcitabine  12/06/2021 Cycle 20 Gemcitabine  12/20/2021 CT 12/31/2021-mild increase in size of bilateral pulmonary metastases, stable subtle continuation right liver lesions Cycle 20 gemcitabine /Abraxane  01/03/2022 Cycle 21 gemcitabine /Abraxane  01/17/2022 Cycle 22 gemcitabine /Abraxane  01/31/2022 Cycle 23 gemcitabine /Abraxane  02/14/2022 Cycle 24 gemcitabine /Abraxane  02/28/2022 CTs 03/11/2022-widespread metastatic disease to the lungs again noted with slight involution of several of the pulmonary nodules, no definite new nodules noted; interval cavitation of solid lesion in the posterior aspect right lobe of the liver, no new liver lesions noted. Cycle 25 Gemcitabine /Abraxane  03/14/2022 Cycle 26 gemcitabine /Abraxane  03/28/2022 Cycle 27 gemcitabine /Abraxane  04/25/2022 Cycle 28 gemcitabine /Abraxane  05/09/2022 Cycle 29 Gemcitabine  05/23/2022, Abraxane  held due to neuropathy CTs 06/04/2022-stable multifocal cavitary pulmonary nodules.  No definite new nodules.  No new nodules greater than a centimeter.  Decreased size of the right  posterior hepatic lobe lesion.  Generalized heterogeneity and new focal areas of hypodensity in the liver. Cycle 30 Gemcitabine  06/06/2022, Abraxane  held due to neuropathy Cycle 31 gemcitabine  06/20/2022, Abraxane  held due to neuropathy Cycle 32 gemcitabine  07/04/2022, Abraxane  held due to neuropathy Cycle 33 gemcitabine   07/18/2022 ,Abraxane  held due to neuropathy Cycle 34 gemcitabine  08/15/2022, Abraxane  held due to neuropathy Cycle 35 gemcitabine  08/29/2022, Abraxane  held due to neuropathy CTs 09/10/2022-enlarging pulmonary metastases.  Subtle poorly defined hypoattenuating lesions in the liver, less well-seen than on 06/04/2022.  Suspected new lesion in the dome  of the right hepatic lobe Cycle 36 gemcitabine /Abraxane  09/12/2022 Cycle 37 gemcitabine /Abraxane  09/26/2022 Cycle 38 gemcitabine /Abraxane  10/10/2022 Cycle 39 gemcitabine /Abraxane  11/07/2022 Cycle 40 gemcitabine /Abraxane  11/21/2022 Cycle 41 gemcitabine /Abraxane  12/05/2022 Cycle 42 gemcitabine /Abraxane  12/19/2022 CT is 12/30/2022-new left hepatic lesion, stable bilateral pulmonary metastases Cycle 43 gemcitabine /Abraxane  01/02/2023 CT-guided biopsy of liver lesion 01/27/2023-acute/chronic inflammation, negative for malignancy, culture negative Cycle 44 gemcitabine /Abraxane  01/30/2023 Cycle 45 gemcitabine /Abraxane  02/13/2023 Cycle 46 gemcitabine /Abraxane  02/27/2023 Cycle 47 gemcitabine /Abraxane  03/13/2023 Cycle 48 gemcitabine /Abraxane  03/27/2023 CTs 04/21/2023-numerous solid and cavitary lung nodules overall similar, some slightly enlarged; multiple new and enlarging rim-enhancing liver lesions. Cycle 1 FOLFIRI with liposomal irinotecan  05/21/2023 Cycle 2 held 06/05/2023 due to neutropenia Cycle 2 FOLFIRI with liposomal irinotecan  06/18/2023, irinotecan  dose reduced (white cell growth factor declined per patient) Cycle 3 FOLFIRI with liposomal irinotecan  07/02/2023 Cycle 4 FOLFIRI with liposomal irinotecan  07/16/2023 Cycle 5 FOLFIRI with liposomal irinotecan   07/30/2023 Cycle 6 FOLFIRI with liposomal irinotecan  08/13/2023 CTs 08/20/2023-numerous cavitary lesions seen in the lungs, extent and distribution appear similar.  A few lesions are slightly smaller.  Numerous liver lesions are now smaller.  Stable right adrenal nodule, adenoma favored. 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  Apixaban  10/24/2022 Port-A-Cath malfunction 08/01/2023.  Port-A-Cath removed and replaced 08/08/2023.       Disposition: Ms. Jardines appears stable.  She has completed 10 treatments with a 5-FU/liposomal irinotecan .  She is tolerating the treatment well.  The CA 19-9 is lower.  She will complete another cycle of 5-FU/liposomal irinotecan  today.  She has hypokalemia.  She will receive IV potassium today and she will continue an oral potassium supplement.  The magnesium  level is normal today.  Ms. Kackley will return for an office visit and chemotherapy  in 2 weeks.  She will be scheduled for a restaging CT evaluation after cycle twelve 5-FU/liposomal irinotecan .  The etiology of the abdomen/leg cramps last week is unclear.  This may be related to hypokalemia or chemotherapy.  Coni Deep, MD  10/22/2023  9:51 AM

## 2023-10-22 NOTE — Patient Instructions (Signed)

## 2023-10-23 LAB — CANCER ANTIGEN 19-9: CA 19-9: 156 U/mL — ABNORMAL HIGH (ref 0–35)

## 2023-10-24 ENCOUNTER — Inpatient Hospital Stay

## 2023-10-24 VITALS — BP 120/81 | HR 78 | Temp 98.6°F | Resp 18

## 2023-10-24 DIAGNOSIS — Z905 Acquired absence of kidney: Secondary | ICD-10-CM | POA: Diagnosis not present

## 2023-10-24 DIAGNOSIS — C7802 Secondary malignant neoplasm of left lung: Secondary | ICD-10-CM | POA: Diagnosis not present

## 2023-10-24 DIAGNOSIS — C3411 Malignant neoplasm of upper lobe, right bronchus or lung: Secondary | ICD-10-CM | POA: Diagnosis not present

## 2023-10-24 DIAGNOSIS — I10 Essential (primary) hypertension: Secondary | ICD-10-CM | POA: Diagnosis not present

## 2023-10-24 DIAGNOSIS — Z9049 Acquired absence of other specified parts of digestive tract: Secondary | ICD-10-CM | POA: Diagnosis not present

## 2023-10-24 DIAGNOSIS — C250 Malignant neoplasm of head of pancreas: Secondary | ICD-10-CM

## 2023-10-24 DIAGNOSIS — E1140 Type 2 diabetes mellitus with diabetic neuropathy, unspecified: Secondary | ICD-10-CM | POA: Diagnosis not present

## 2023-10-24 DIAGNOSIS — Z5111 Encounter for antineoplastic chemotherapy: Secondary | ICD-10-CM | POA: Diagnosis not present

## 2023-10-24 DIAGNOSIS — Z79899 Other long term (current) drug therapy: Secondary | ICD-10-CM | POA: Diagnosis not present

## 2023-10-24 DIAGNOSIS — C7801 Secondary malignant neoplasm of right lung: Secondary | ICD-10-CM | POA: Diagnosis not present

## 2023-10-24 DIAGNOSIS — Z95828 Presence of other vascular implants and grafts: Secondary | ICD-10-CM

## 2023-10-24 MED ORDER — SODIUM CHLORIDE 0.9% FLUSH
10.0000 mL | Freq: Once | INTRAVENOUS | Status: DC
Start: 2023-10-24 — End: 2023-10-24

## 2023-10-24 MED ORDER — HEPARIN SOD (PORK) LOCK FLUSH 100 UNIT/ML IV SOLN: 500.0000 [IU] | Freq: Once | INTRAVENOUS | Status: DC

## 2023-10-24 NOTE — Patient Instructions (Signed)

## 2023-10-29 ENCOUNTER — Other Ambulatory Visit: Payer: Self-pay | Admitting: Oncology

## 2023-10-29 ENCOUNTER — Encounter: Payer: Self-pay | Admitting: Oncology

## 2023-10-29 ENCOUNTER — Telehealth: Payer: Self-pay | Admitting: Pharmacist

## 2023-10-29 DIAGNOSIS — E1165 Type 2 diabetes mellitus with hyperglycemia: Secondary | ICD-10-CM

## 2023-10-29 NOTE — Progress Notes (Signed)
   10/29/2023  Patient ID: Alyssa Ross, female   DOB: [birth year], 57 y.o.   MRN: [id number]  Patient was called to follow up. Unfortunately, she did not answer her phone. HIPAA compliant message was left on her voicemail.  Patient's blood sugars were reviewed on [employer]estyle Libreview:    Patient's GMI is now 10.1%. Her basal/bolus dosing needs to be increased.  Plan: Call Patient back in 5-7 business days.   Geronimo Krabbe, PharmD, BCACP Clinical Pharmacist [phone]

## 2023-10-29 NOTE — Telephone Encounter (Signed)
-----   Message from Sung Engels sent at 10/20/2023  6:36 PM EDT ----- Regarding: FW: Follow up on log, blood sugars, exercise and decrease sugary drinks  ----- Message ----- From: Geronimo Krabbe, [facility] Sent: 10/22/2023  12:00 AM EDT To: Geronimo Krabbe, RPH Subject: FW: Follow up on log, blood sugars, exercise#   ----- Message ----- From: Geronimo Krabbe, [facility] Sent: 09/02/2023  12:00 AM EDT To: Geronimo Krabbe, RPH Subject: Follow up on log, blood sugars, exercise

## 2023-11-04 ENCOUNTER — Encounter: Payer: Self-pay | Admitting: Nurse Practitioner

## 2023-11-04 ENCOUNTER — Inpatient Hospital Stay (HOSPITAL_BASED_OUTPATIENT_CLINIC_OR_DEPARTMENT_OTHER): Admitting: Nurse Practitioner

## 2023-11-04 DIAGNOSIS — Z515 Encounter for palliative care: Secondary | ICD-10-CM | POA: Diagnosis not present

## 2023-11-04 DIAGNOSIS — R530 Neoplastic (malignant) related fatigue: Secondary | ICD-10-CM | POA: Diagnosis not present

## 2023-11-04 DIAGNOSIS — Z7189 Other specified counseling: Secondary | ICD-10-CM | POA: Diagnosis not present

## 2023-11-04 DIAGNOSIS — K5903 Drug induced constipation: Secondary | ICD-10-CM

## 2023-11-04 MED ORDER — SODIUM CHLORIDE 0.9 % IV SOLN
2400.0000 mg/m2 | INTRAVENOUS | Status: DC
  Administered 2023-11-05: 5000 mg via INTRAVENOUS
  Filled 2023-11-04: qty 100

## 2023-11-04 NOTE — Progress Notes (Signed)
 Palliative Medicine [facility]  [phone] [phone]   Name: Anyra Kaufman Date: 11/04/2023 MRN: [id number]  DOB: [birth year]  Patient Care Team: Cleave Curling, MD as PCP - General (Internal Medicine) Mariette Shorts, RN (Inactive) as Oncology Nurse Navigator Pickenpack-Cousar, Giles Labrum, NP as Nurse Practitioner (Hospice and Palliative Medicine) Geronimo Krabbe, [facility] as Pharmacist (Pharmacist)   I connected with Ardith Krill on 11/04/23 at  3:00 PM EDT by Phone and verified that I am speaking with the correct person using two identifiers.   I discussed the limitations, risks, security and privacy concerns of performing an evaluation and management service by telemedicine and the availability of in-person appointments. I also discussed with the patient that there may be a patient responsible charge related to this service. The patient expressed understanding and agreed to proceed.   Other persons participating in the visit and their role in the encounter: N/A   Patient's location: Home  Provider's location: [facility]    INTERVAL HISTORY: Yomira Flitton is a 58 y.o. female with oncologic medical history including pancreatic cancer (01/2019). The patient is 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 and is currently receiving FOLFIRI with liposomal irinotecan . Palliative ask to see for symptom management and goals of care.   SOCIAL HISTORY:     reports that she has never smoked. She has never used smokeless tobacco. She reports that she does not currently use alcohol. She reports that she does not use drugs.  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  MEDICATIONS:  Current Outpatient Medications  Medication Sig Dispense Refill   apixaban  (ELIQUIS ) 5 MG TABS tablet Take 1 tablet (5 mg total) by mouth 2 (two) times daily. 180 tablet 2   cetirizine (ZYRTEC) 10 MG tablet Take 10 mg by mouth daily.     Continuous Blood Gluc Sensor (FREESTYLE LIBRE 2 SENSOR) MISC APPLY EVERY 14 DAYS 2 each 5   glucose blood test strip Check sugar 2 times daily. 100 each 12   Insulin  Degludec (TRESIBA  FLEXTOUCH [place]) Inject into the skin. 12-18 Units     insulin  lispro (HUMALOG  KWIKPEN) 200 UNIT/ML KwikPen 6 units with breakfast, 13 units with lunch, 16 units with dinner     Insulin  Pen Needle (BD PEN NEEDLE NANO U/F) 32G X 4 MM MISC USE TO INJECT INSULIN  THREE TIMES A DAY 270 each 3   lidocaine -prilocaine  (EMLA ) cream Apply 1 Application topically as directed. Apply 1 hour prior to stick and cover with plastic wrap (Patient not taking: Reported on 10/22/2023) 30 g 5   magnesium  oxide (MAG-OX) 400 (240 Mg) MG tablet TAKE  1 TABLET(400 MG) BY MOUTH TWICE DAILY 180 tablet 1   ondansetron  (ZOFRAN ) 8 MG tablet Take 1 tablet (8 mg total) by mouth every 8 (eight) hours as needed for nausea or vomiting (do not begin until 72 hours after chemo). (Patient not taking: Reported on 10/22/2023) 20 tablet 3   Polyethylene Glycol 3350 (MIRALAX PO) Take 17 g by mouth daily.     potassium chloride  SA (KLOR-CON  M) 20 MEQ tablet Take 1 tablet (20 mEq total) by mouth 4 (four) times daily. 360 tablet 1   prochlorperazine  (COMPAZINE )  10 MG tablet TAKE 1 TABLET(10 MG) BY MOUTH EVERY 6 HOURS AS NEEDED FOR NAUSEA OR VOMITING (Patient not taking: Reported on 10/22/2023) 60 tablet 2   rosuvastatin  (CRESTOR ) 10 MG tablet TAKE 1 TABLET AT BEDTIME (Patient taking differently: Take 10 mg by mouth 3 (three) times a week.) 90 tablet 3   spironolactone  (ALDACTONE ) 25 MG tablet TAKE 1 TABLET(25 MG) BY MOUTH DAILY (Patient not taking: Reported on 10/22/2023) 90 tablet 2   valsartan  (DIOVAN ) 160 MG tablet Take 1 tablet (160 mg total) by mouth daily. 90 tablet 3   No current facility-administered medications for this visit.   Facility-Administered Medications Ordered in Other Visits  Medication Dose Route Frequency Provider Last Rate Last Admin   [START ON 11/05/2023] fluorouracil  (ADRUCIL ) 5,000 mg in sodium chloride  0.9 % 150 mL chemo infusion  2,400 mg/m2 (Treatment Plan Recorded) Intravenous 1 day or 1 dose Sherrill, Gary B, MD       sodium chloride  flush (NS) 0.9 % injection 10 mL  10 mL Intravenous PRN [name], Lisa K, NP   10 mL at 10/24/22 1311    VITAL SIGNS: LMP 09/17/2012  There were no vitals filed for this visit.  Estimated body mass index is 27.86 kg/m as calculated from the following:   Height as of 10/22/23: 5\' 8"  (1.727 m).   Weight as of 10/22/23: 183 lb 3.2 oz (83.1 kg).   PERFORMANCE STATUS (ECOG) : 1 - Symptomatic but completely ambulatory  IMPRESSION: Discussed the use of AI scribe software for clinical note transcription with the patient, who gave verbal consent to proceed.  History of Present Illness Elea Holtzclaw is a 58 year old female who I connected by phone with for follow-up. No acute distress. She is doing well overall. Taking things one day at a time.   She is managing her diabetes with a focus on controlling her A1c levels. She has started incorporating exercise into her routine, although she has only managed one day so far. She is also working on improving her diet and ensuring she takes her medication on  time.  She experiences a decrease in appetite for a few days following her cancer treatment, but her appetite returns to normal, allowing her to maintain her weight. She experiences some weight loss post-treatment but regains it before the next session. Her CA 19-9 levels have not increased and remains stable. She anticipates transitioning to treatment every three weeks instead of every two weeks.  Christalyn experiences constipation, which she manages with daily Miralax. She adjusts the dose based on her symptoms, sometimes taking half a dose or skipping it, which can lead to constipation. The constipation is associated with hemorrhoids, which occasionally bleed.  Ms. Hornaday experiences fatigue, particularly towards the end of the week, which she attributes to long workdays and poor sleep, especially on Wednesday nights due to steroid use. The effects of the steroids seem to wear off sooner than before,  affecting her energy levels and blood sugar by Friday. She works long days during treatment weeks, which she finds exhausting, especially as the week progresses. Despite the fatigue, she enjoys her work.  Goals of Care Patient has completed advanced directives naming Mondalyn and Shellene Sweigert as her medical decision makers in the event she is unable to speak for herself.  No desire for artificial feedings.  No life prolonging measures in the event of a terminal state.  I discussed the importance of continued conversation with family and their medical providers regarding overall plan of care and treatment options, ensuring decisions are within the context of the patients values and GOCs. Assessment & Plan  Type 2 diabetes mellitus Suboptimal control with inconsistent exercise and dietary adherence. Improved A1c control needed. - Encouraged dietary modifications and increased physical activity. - Regular blood glucose monitoring. - Consider dietitian consultation.  Fatigue related to treatment Fatigue  likely due to treatment regimen and long work hours, correlating with steroid wear-off and blood sugar changes. - Encouraged rest and adequate sleep during treatment weeks. - Monitor blood sugar levels.  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 - Consider sleep hygiene education. - Evaluate adjusting steroid dosing schedule.  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 - Continue daily Miralax with dose adjustments based on bowel movement consistency. - Educated on regular dosing importance.  Hemorrhoids Occasional bleeding hemorrhoids, exacerbated by constipation, improved with bowel management. - Manage constipation to reduce hemorrhoid exacerbation. - Consider topical treatments for relief.  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  Goals of Care Advanced directive completed. MOST form pending completion. - Schedule call to complete MOST form in the upcoming weeks.  Follow-up Lab work scheduled to monitor liver function.  - I will plan to see patient back in 6-8 weeks.  Sooner if needed.  Patient expressed understanding and was in agreement with this plan. She also understands that She can call the clinic at any time with any questions, concerns, or complaints.   I provided 25 minutes of non face-to-face telephone visit time during this encounter, and > 50% was spent counseling as documented under my assessment & plan. Visit consisted of counseling and education dealing with the complex and emotionally intense issues of symptom management and palliative care in the setting of serious and potentially life-threatening illness.  Dellia Ferguson, AGPCNP-BC  Palliative Medicine Team/[facility]

## 2023-11-05 ENCOUNTER — Inpatient Hospital Stay

## 2023-11-05 ENCOUNTER — Telehealth: Payer: Self-pay

## 2023-11-05 ENCOUNTER — Telehealth: Payer: Self-pay | Admitting: Pharmacist

## 2023-11-05 ENCOUNTER — Inpatient Hospital Stay (HOSPITAL_BASED_OUTPATIENT_CLINIC_OR_DEPARTMENT_OTHER): Admitting: Oncology

## 2023-11-05 VITALS — BP 121/80 | HR 80 | Temp 98.1°F | Resp 18 | Ht 68.0 in | Wt 184.1 lb

## 2023-11-05 DIAGNOSIS — C7802 Secondary malignant neoplasm of left lung: Secondary | ICD-10-CM | POA: Diagnosis not present

## 2023-11-05 DIAGNOSIS — Z79899 Other long term (current) drug therapy: Secondary | ICD-10-CM | POA: Diagnosis not present

## 2023-11-05 DIAGNOSIS — C250 Malignant neoplasm of head of pancreas: Secondary | ICD-10-CM

## 2023-11-05 DIAGNOSIS — C7801 Secondary malignant neoplasm of right lung: Secondary | ICD-10-CM | POA: Diagnosis not present

## 2023-11-05 DIAGNOSIS — I10 Essential (primary) hypertension: Secondary | ICD-10-CM | POA: Diagnosis not present

## 2023-11-05 DIAGNOSIS — E1165 Type 2 diabetes mellitus with hyperglycemia: Secondary | ICD-10-CM

## 2023-11-05 DIAGNOSIS — E876 Hypokalemia: Secondary | ICD-10-CM

## 2023-11-05 DIAGNOSIS — Z905 Acquired absence of kidney: Secondary | ICD-10-CM | POA: Diagnosis not present

## 2023-11-05 DIAGNOSIS — Z9049 Acquired absence of other specified parts of digestive tract: Secondary | ICD-10-CM | POA: Diagnosis not present

## 2023-11-05 DIAGNOSIS — E1140 Type 2 diabetes mellitus with diabetic neuropathy, unspecified: Secondary | ICD-10-CM | POA: Diagnosis not present

## 2023-11-05 DIAGNOSIS — Z5111 Encounter for antineoplastic chemotherapy: Secondary | ICD-10-CM | POA: Diagnosis not present

## 2023-11-05 DIAGNOSIS — C3411 Malignant neoplasm of upper lobe, right bronchus or lung: Secondary | ICD-10-CM | POA: Diagnosis not present

## 2023-11-05 LAB — CBC WITH DIFFERENTIAL (CANCER CENTER ONLY)
Abs Immature Granulocytes: 0.01 10*3/uL (ref 0.00–0.07)
Basophils Absolute: 0 10*3/uL (ref 0.0–0.1)
Basophils Relative: 1 %
Eosinophils Absolute: 0.1 10*3/uL (ref 0.0–0.5)
Eosinophils Relative: 4 %
HCT: 30.2 % — ABNORMAL LOW (ref 36.0–46.0)
Hemoglobin: 10 g/dL — ABNORMAL LOW (ref 12.0–15.0)
Immature Granulocytes: 0 %
Lymphocytes Relative: 24 %
Lymphs Abs: 0.9 10*3/uL (ref 0.7–4.0)
MCH: 33.4 pg (ref 26.0–34.0)
MCHC: 33.1 g/dL (ref 30.0–36.0)
MCV: 101 fL — ABNORMAL HIGH (ref 80.0–100.0)
Monocytes Absolute: 0.3 10*3/uL (ref 0.1–1.0)
Monocytes Relative: 9 %
Neutro Abs: 2.5 10*3/uL (ref 1.7–7.7)
Neutrophils Relative %: 62 %
Platelet Count: 347 10*3/uL (ref 150–400)
RBC: 2.99 MIL/uL — ABNORMAL LOW (ref 3.87–5.11)
RDW: 13.6 % (ref 11.5–15.5)
WBC Count: 3.9 10*3/uL — ABNORMAL LOW (ref 4.0–10.5)
nRBC: 0 % (ref 0.0–0.2)

## 2023-11-05 LAB — CMP (CANCER CENTER ONLY)
ALT: 31 U/L (ref 0–44)
AST: 26 U/L (ref 15–41)
Albumin: 3.5 g/dL (ref 3.5–5.0)
Alkaline Phosphatase: 326 U/L — ABNORMAL HIGH (ref 38–126)
Anion gap: 11 (ref 5–15)
BUN: 6 mg/dL (ref 6–20)
CO2: 23 mmol/L (ref 22–32)
Calcium: 9.1 mg/dL (ref 8.9–10.3)
Chloride: 110 mmol/L (ref 98–111)
Creatinine: 0.89 mg/dL (ref 0.44–1.00)
GFR, Estimated: >60 mL/min (ref >60)
Glucose, Bld: 85 mg/dL (ref 70–99)
Potassium: 3.3 mmol/L — ABNORMAL LOW (ref 3.5–5.1)
Sodium: 144 mmol/L (ref 135–145)
Total Bilirubin: 0.4 mg/dL (ref 0.0–1.2)
Total Protein: 6.6 g/dL (ref 6.5–8.1)

## 2023-11-05 LAB — MAGNESIUM: Magnesium: 1.8 mg/dL (ref 1.7–2.4)

## 2023-11-05 MED ORDER — ATROPINE SULFATE 1 MG/ML IV SOLN
0.5000 mg | Freq: Once | INTRAVENOUS | Status: AC | PRN
  Administered 2023-11-05: 0.5 mg via INTRAVENOUS
  Filled 2023-11-05: qty 1

## 2023-11-05 MED ORDER — SODIUM CHLORIDE 0.9 % IV SOLN
55.0000 mg/m2 | Freq: Once | INTRAVENOUS | Status: AC
  Administered 2023-11-05: 107.5 mg via INTRAVENOUS
  Filled 2023-11-05: qty 25

## 2023-11-05 MED ORDER — SODIUM CHLORIDE 0.9 % IV SOLN
400.0000 mg/m2 | Freq: Once | INTRAVENOUS | Status: AC
  Administered 2023-11-05: 776 mg via INTRAVENOUS
  Filled 2023-11-05: qty 38.8

## 2023-11-05 MED ORDER — DEXAMETHASONE SODIUM PHOSPHATE 10 MG/ML IJ SOLN
10.0000 mg | Freq: Once | INTRAMUSCULAR | Status: AC
  Administered 2023-11-05: 10 mg via INTRAVENOUS
  Filled 2023-11-05: qty 1

## 2023-11-05 MED ORDER — PALONOSETRON HCL INJECTION 0.25 MG/5ML
0.2500 mg | Freq: Once | INTRAVENOUS | Status: AC
  Administered 2023-11-05: 0.25 mg via INTRAVENOUS
  Filled 2023-11-05: qty 5

## 2023-11-05 MED ORDER — SODIUM CHLORIDE 0.9 % IV SOLN: INTRAVENOUS | Status: DC

## 2023-11-05 NOTE — Progress Notes (Signed)
   11/05/2023  Patient ID: Alyssa Ross, female   DOB: 1966-01-05, 58 y.o.   MRN: [id number]  Patient was called to follow up on blood sugars. Unfortunately, she did not answer her phone. HIPAA compliant message was left on her voicemail.  From chart review, Patient may have been getting an infusion at the time of my call.  Next appt with Dr. Elnita Hai is 12/22/23  Reviewed her Glucose reports from the [facility]:  Name: [name] Date of Birth: [birth year] Report Period: 10/23/2023 - 11/05/2023 (14 days) Generated: 11/05/2023 Time CGM Active: 94%   Glucose Statistics and Targets Average Glucose: 284 mg/dL Glucose Management Indicator (GMI): 10.1% Glucose Variability (%CV): 29.7% Target Range: 70 - 180 mg/dL   Time in Ranges Very High: >250 mg/dL --- 95% High: 621 - 308 mg/dL --- 65% Target Range: 70 - 180 mg/dL --- 78% Low: 54 - 69 mg/dL --- 0% Very Low: <46 mg/dL --- 0%   Plan: Call the Patient back in 2 weeks. Patient's basal/bolus dose needs to be increased   Aneka Fagerstrom J. Jabron Weese, PharmD, [facility] Clinical Pharmacist [phone]

## 2023-11-05 NOTE — Telephone Encounter (Signed)
 I contacted Alyssa Ross to inform her of her upcoming appointment. I provided my direct line for re-scheduling purposes.

## 2023-11-05 NOTE — Progress Notes (Signed)
 Patient seen by Dr. Coni Deep today  Vitals are within treatment parameters:Yes   Labs are within treatment parameters: Yes OK to proceed with K+ 3.3  Treatment plan has been signed: Yes   Per physician team, Patient is ready for treatment and there are NO modifications to the treatment plan.

## 2023-11-05 NOTE — Progress Notes (Signed)
  [facility] OFFICE PROGRESS NOTE   Diagnosis: Pancreas cancer   INTERVAL HISTORY:   Ms. Carboni completed another cycle of 5-FU/irinotecan  on 10/22/2023.  No nausea/vomiting or diarrhea.  She has mild soreness following chemotherapy, but no ulcers.  She feels well.  She is working.  She has started to exercise.  Objective:  Vital signs in last 24 hours:  Blood pressure 121/80, pulse 80, temperature 98.1 F (36.7 C), temperature source Temporal, resp. rate 18, height 5\' 8"  (1.727 m), weight 184 lb 1.6 oz (83.5 kg), last menstrual period 09/17/2012, SpO2 100%.    HEENT: No thrush or ulcers Resp: Lungs clear bilaterally Cardio: Regular rate and rhythm GI: No mass, nontender, no hepatosplenomegaly Vascular: No leg edema  Skin: Hyperpigmentation of the hands  Portacath/PICC-without erythema  Lab Results:  Lab Results  Component Value Date   WBC 3.9 (L) 11/05/2023   HGB 10.0 (L) 11/05/2023   HCT 30.2 (L) 11/05/2023   MCV 101.0 (H) 11/05/2023   PLT 347 11/05/2023   NEUTROABS 2.5 11/05/2023    CMP  Lab Results  Component Value Date   NA 139 10/22/2023   K 3.1 (L) 10/22/2023   CL 107 10/22/2023   CO2 24 10/22/2023   GLUCOSE 274 (H) 10/22/2023   BUN 5 (L) 10/22/2023   CREATININE 0.95 10/22/2023   CALCIUM  8.8 (L) 10/22/2023   PROT 6.2 (L) 10/22/2023   ALBUMIN  3.4 (L) 10/22/2023   AST 23 10/22/2023   ALT 27 10/22/2023   ALKPHOS 248 (H) 10/22/2023   BILITOT 0.4 10/22/2023   GFRNONAA >60 10/22/2023   GFRAA >60 02/07/2020    Lab Results  Component Value Date   ZOX096 156 (H) 10/22/2023     Medications: I have reviewed the patient's current medications.   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 Tumor invades the duodenal wall and vascular groove, resection margins negative, 4/34 lymph nodes positive MSI-stable, tumor showed instability in 2 loci as did adjacent normal  tissue Foundation 1-K-ras G12 V, microsatellite status and tumor mutation burden cannot be determined EUS FNA biopsy of pancreas mass on 07/03/2018-well-differentiated neuroendocrine tumor CTs 01/29/2019-ill-defined pancreas head mass, 5 pulmonary nodules-1 with a small amount of central cavitation, tumor abuts the left margin of the portal vein indistinct density surrounding, hepatic artery, complex cystic lesion of the right kidney, right adrenal mass-characterized as an adenoma on a [facility] MRI 12/21/2018 [facility]  03/03/2019-no focal pancreas activity, no tracer accumulation within the suspicious pulmonary nodules, left uterine mass with tracer accumulation felt to represent a leiomyoma Elevated preoperative CA 19-9--CA 19-9 186 on 01/18/2019 CT chest 04/16/2019-multiple bilateral pulmonary nodules, some with increased cavitation, stable in size Cycle 1 FOLFIRINOX 04/27/2019 Cycle 2 FOLFIRINOX 05/11/2019 Cycle 3 FOLFIRINOX 05/23/2019 Cycle 4 FOLFIRINOX 06/08/2019 Cycle 5 FOLFIRINOX 06/22/2019 CT chest 07/02/2019-stable size of bilateral pulmonary nodules.  Dominant cavitary lesions in both lungs show increased cavitation with thinner walls.  Stable 2.1 cm right adrenal nodule. Cycle 6 FOLFIRINOX 07/06/2019 Cycle 7 FOLFIRINOX 07/21/2019 Cycle 8 FOLFIRINOX 08/03/2019, oxaliplatin  deleted secondary to neuropathy CT chest 08/24/2019-decreased size of several lung nodules with resolution of a left upper lobe nodule, no new nodules Radiation to the pancreas surgical area with concurrent Xeloda  09/13/2019-10/20/2019  CTs 11/29/2019-multiple small pulmonary nodules scattered throughout the lungs bilaterally, appear increased in number and size. No definite evidence of metastatic disease in the abdomen or pelvis. Markedly enlarged and heterogeneous appearing uterus, likely to represent multifocal fibroids. 1 of these lesions appears to  be an exophytic subserosal fibroid in the posterior lateral aspect of the uterine body  on the left side although this comes in close proximity to the left adnexa such that a primary ovarian lesion is difficult to completely exclude. CTs 02/07/2020-slight enlargement of bilateral lung nodules, some are cavitary, no evidence of metastatic disease in the abdomen or pelvis, stable right adrenal nodule, uterine fibroids CTs 04/26/2020-mild enlargement of pulmonary nodules, slight increase in trace pelvic fluid, new soft tissue thickening inferior to the cecal tip suspicious for peritoneal metastasis CT 05/26/2020-improved appearance of soft tissue at the inferior tip of the cecum, mildly thickened short appendix-findings suggestive of resolving appendicitis, stable small bibasilar pulmonary nodules, fibroids Plan biopsy of right cecal tip soft tissue canceled secondary to radiologic improvement CT chest 08/02/2020-enlargement and progressive cavitation of multiple bilateral lung nodules.  Some new nodules are present. CTs 10/24/2020- increase in size of pulmonary nodules, no new nodules, no evidence of metastatic disease in the abdomen, stable right adrenal nodule CT 01/09/2021-slight interval enlargement of pulmonary nodules, stable right adrenal nodule Navigation bronchoscopy 01/30/2021-left lower lobe cavitary nodule FNA-adenocarcinoma, brushing-adenocarcinoma.  Left lower lobe lavage-adenocarcinoma.  Right upper lobe brushing and FNA biopsy of cavitary nodule-adenocarcinoma-immunohistochemical profile consistent with pancreas adenocarcinoma Cycle 1 gemcitabine /Abraxane  03/28/2021 Cycle 2 gemcitabine /Abraxane  04/11/2021 Cycle 3 gemcitabine /Abraxane  04/25/2021 Cycle 4 gemcitabine /Abraxane  05/09/2021 Cycle 5 gemcitabine /Abraxane  05/23/2021 CT chest 06/05/2021-interval cavitation of multiple pulmonary nodules, some nodules have decreased in size, no new or enlarging nodules Cycle 6 gemcitabine /Abraxane  06/06/2021 Cycle 7 gemcitabine /Abraxane  06/21/2021 Cycle 8 gemcitabine /Abraxane  07/05/2021 Cycle  9 Gemcitabine /Abraxane  07/19/2021 Cycle 10 gemcitabine  08/01/2021-Abraxane  held secondary to neuropathy CT chest 08/13/2021-mild decrease in size and wall thickness of multiple cavitary nodules, no new or progressive disease in the chest, indeterminate low-attenuation right liver lesions Cycle 11 gemcitabine  08/16/2021-Abraxane  held secondary to neuropathy Cycle 12 gemcitabine  08/30/2021-Abraxane  held secondary to neuropathy Cycle 13 gemcitabine  09/13/2021-Abraxane  held secondary to neuropathy Cycle 14 gemcitabine  09/27/2021-Abraxane  held secondary to neuropathy Cycle 15 gemcitabine  10/11/2021-Abraxane  held secondary to neuropathy CTs 10/22/2021-no change in multiple cavitary lung nodules, no evidence of disease progression, ill-defined hypodense lesion in the posterior right liver suspicious for metastatic disease Cycle 16 gemcitabine  10/25/2021 Cycle 17 gemcitabine  11/08/2021 Cycle 18 Gemcitabine  11/22/2021 Cycle 19 gemcitabine  12/06/2021 Cycle 20 Gemcitabine  12/20/2021 CT 12/31/2021-mild increase in size of bilateral pulmonary metastases, stable subtle continuation right liver lesions Cycle 20 gemcitabine /Abraxane  01/03/2022 Cycle 21 gemcitabine /Abraxane  01/17/2022 Cycle 22 gemcitabine /Abraxane  01/31/2022 Cycle 23 gemcitabine /Abraxane  02/14/2022 Cycle 24 gemcitabine /Abraxane  02/28/2022 CTs 03/11/2022-widespread metastatic disease to the lungs again noted with slight involution of several of the pulmonary nodules, no definite new nodules noted; interval cavitation of solid lesion in the posterior aspect right lobe of the liver, no new liver lesions noted. Cycle 25 Gemcitabine /Abraxane  03/14/2022 Cycle 26 gemcitabine /Abraxane  03/28/2022 Cycle 27 gemcitabine /Abraxane  04/25/2022 Cycle 28 gemcitabine /Abraxane  05/09/2022 Cycle 29 Gemcitabine  05/23/2022, Abraxane  held due to neuropathy CTs 06/04/2022-stable multifocal cavitary pulmonary nodules.  No definite new nodules.  No new nodules greater than a centimeter.   Decreased size of the right posterior hepatic lobe lesion.  Generalized heterogeneity and new focal areas of hypodensity in the liver. Cycle 30 Gemcitabine  06/06/2022, Abraxane  held due to neuropathy Cycle 31 gemcitabine  06/20/2022, Abraxane  held due to neuropathy Cycle 32 gemcitabine  07/04/2022, Abraxane  held due to neuropathy Cycle 33 gemcitabine   07/18/2022 ,Abraxane  held due to neuropathy Cycle 34 gemcitabine  08/15/2022, Abraxane  held due to neuropathy Cycle 35 gemcitabine  08/29/2022, Abraxane  held due to neuropathy CTs 09/10/2022-enlarging pulmonary metastases.  Subtle poorly defined hypoattenuating lesions in the liver, less  well-seen than on 06/04/2022.  Suspected new lesion in the dome of the right hepatic lobe Cycle 36 gemcitabine /Abraxane  09/12/2022 Cycle 37 gemcitabine /Abraxane  09/26/2022 Cycle 38 gemcitabine /Abraxane  10/10/2022 Cycle 39 gemcitabine /Abraxane  11/07/2022 Cycle 40 gemcitabine /Abraxane  11/21/2022 Cycle 41 gemcitabine /Abraxane  12/05/2022 Cycle 42 gemcitabine /Abraxane  12/19/2022 CT is 12/30/2022-new left hepatic lesion, stable bilateral pulmonary metastases Cycle 43 gemcitabine /Abraxane  01/02/2023 CT-guided biopsy of liver lesion 01/27/2023-acute/chronic inflammation, negative for malignancy, culture negative Cycle 44 gemcitabine /Abraxane  01/30/2023 Cycle 45 gemcitabine /Abraxane  02/13/2023 Cycle 46 gemcitabine /Abraxane  02/27/2023 Cycle 47 gemcitabine /Abraxane  03/13/2023 Cycle 48 gemcitabine /Abraxane  03/27/2023 CTs 04/21/2023-numerous solid and cavitary lung nodules overall similar, some slightly enlarged; multiple new and enlarging rim-enhancing liver lesions. Cycle 1 FOLFIRI with liposomal irinotecan  05/21/2023 Cycle 2 held 06/05/2023 due to neutropenia Cycle 2 FOLFIRI with liposomal irinotecan  06/18/2023, irinotecan  dose reduced (white cell growth factor declined per patient) Cycle 3 FOLFIRI with liposomal irinotecan  07/02/2023 Cycle 4 FOLFIRI with liposomal irinotecan  07/16/2023 Cycle 5  FOLFIRI with liposomal irinotecan  07/30/2023 Cycle 6 FOLFIRI with liposomal irinotecan  08/13/2023 CTs 08/20/2023-numerous cavitary lesions seen in the lungs, extent and distribution appear similar.  A few lesions are slightly smaller.  Numerous liver lesions are now smaller.  Stable right adrenal nodule, adenoma favored. 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  Apixaban  10/24/2022 Port-A-Cath malfunction 08/01/2023.  Port-A-Cath removed and replaced 08/08/2023.       Disposition: Alyssa Ross appears stable.  She continues to tolerate the 5-FU/irinotecan  well.  The CA 19-9 was stable on 10/22/2023.  She will complete another cycle of 5-FU/irinotecan  today.  She will undergo a restaging CT evaluation after this cycle. Ms. Mcguffin will return for an office visit and CT review in 2 weeks.  Coni Deep, MD  11/05/2023  9:44  AM

## 2023-11-06 LAB — CANCER ANTIGEN 19-9: CA 19-9: 149 U/mL — ABNORMAL HIGH (ref 0–35)

## 2023-11-07 ENCOUNTER — Inpatient Hospital Stay

## 2023-11-07 VITALS — BP 133/79 | HR 66 | Temp 98.1°F | Resp 16

## 2023-11-07 DIAGNOSIS — C7802 Secondary malignant neoplasm of left lung: Secondary | ICD-10-CM | POA: Diagnosis not present

## 2023-11-07 DIAGNOSIS — E1140 Type 2 diabetes mellitus with diabetic neuropathy, unspecified: Secondary | ICD-10-CM | POA: Diagnosis not present

## 2023-11-07 DIAGNOSIS — Z79899 Other long term (current) drug therapy: Secondary | ICD-10-CM | POA: Diagnosis not present

## 2023-11-07 DIAGNOSIS — C3411 Malignant neoplasm of upper lobe, right bronchus or lung: Secondary | ICD-10-CM | POA: Diagnosis not present

## 2023-11-07 DIAGNOSIS — Z9049 Acquired absence of other specified parts of digestive tract: Secondary | ICD-10-CM | POA: Diagnosis not present

## 2023-11-07 DIAGNOSIS — I10 Essential (primary) hypertension: Secondary | ICD-10-CM | POA: Diagnosis not present

## 2023-11-07 DIAGNOSIS — C7801 Secondary malignant neoplasm of right lung: Secondary | ICD-10-CM | POA: Diagnosis not present

## 2023-11-07 DIAGNOSIS — C250 Malignant neoplasm of head of pancreas: Secondary | ICD-10-CM

## 2023-11-07 DIAGNOSIS — Z5111 Encounter for antineoplastic chemotherapy: Secondary | ICD-10-CM | POA: Diagnosis not present

## 2023-11-07 DIAGNOSIS — Z905 Acquired absence of kidney: Secondary | ICD-10-CM | POA: Diagnosis not present

## 2023-11-07 MED ORDER — HEPARIN SOD (PORK) LOCK FLUSH 100 UNIT/ML IV SOLN
500.0000 [IU] | Freq: Once | INTRAVENOUS | Status: AC | PRN
  Administered 2023-11-07: 500 [IU]

## 2023-11-07 MED ORDER — SODIUM CHLORIDE 0.9% FLUSH
10.0000 mL | INTRAVENOUS | Status: DC | PRN
  Administered 2023-11-07: 10 mL

## 2023-11-07 NOTE — Patient Instructions (Signed)

## 2023-11-09 ENCOUNTER — Other Ambulatory Visit: Payer: Self-pay | Admitting: Oncology

## 2023-11-09 DIAGNOSIS — E876 Hypokalemia: Secondary | ICD-10-CM

## 2023-11-09 DIAGNOSIS — C250 Malignant neoplasm of head of pancreas: Secondary | ICD-10-CM

## 2023-11-10 ENCOUNTER — Encounter: Payer: Self-pay | Admitting: Oncology

## 2023-11-11 ENCOUNTER — Telehealth: Payer: Self-pay | Admitting: Pharmacist

## 2023-11-11 DIAGNOSIS — E1165 Type 2 diabetes mellitus with hyperglycemia: Secondary | ICD-10-CM

## 2023-11-11 NOTE — Progress Notes (Unsigned)
 11/11/2023  Patient ID: Alyssa Ross, female   DOB: Nov 27, 1965, 58 y.o.   MRN: [id number]  Chief Complaint  Patient presents with   Medication Management    Diabetes     Alyssa Ross is a 58 y.o. year old female who presented for a telephone visit.   They were referred to the pharmacist by their PCP for assistance in managing diabetes.    Subjective: Alyssa Ross is a 58 year old female being followed for diabetes management. The purpose of today's call was to follow up.  She has a complex medical history that makes controlling her blood sugars difficult.  This history includes but is not limited to: pancreatic cancer, hypertension, hyperlipidemia, and type 2 diabetes.  She sees Dr. Sheryle Donning with [facility] who is an Endocrinologist and Dr. Scherrie Curt for Oncology.  Care Team: Primary Care Provider: Cleave Curling, MD ; Next Scheduled Visit: 12/22/2023  Medication Access/Adherence  Current Pharmacy:  [id number] Jonette Nestle, [place] - [address] AT [facility] [address] [place] [address] Phone: [phone] Fax: [phone]  [facility] - [place]onda Hale, [place] - [address] [place] [address] Phone: [phone] Fax: [phone]  Melodee Spruce [facility] - [facility] [address] [place] [address] Phone: [phone] Fax: [phone]  [facility] - [place], [place] - [address] [place] [address] Phone: [phone] Fax: [phone]  [id number] - Jonette Nestle, [place] - [address] AT [facility] OF [address] [place] [address] Phone: [phone] Fax: [phone]   Patient reports affordability concerns with their medications: No  Patient reports access/transportation concerns to their pharmacy: No  Patient reports adherence concerns with their medications:   Yes  she varies her insulin  dose on steroid days and then decreases on non steroid days due to lows   Diabetes:  Current medications:   Basal/bolus Tresiba  and Humalog   She is using a sliding scale with a 1:7 correction factor: (From Endocrinologist with [facility])  Tresiba  17 units daily except 25 units on steroid days Humalog  8 units with low carb meals (or use 1:7 ratio )  14 units with high carb meals   Correction scale Blood sugar 30 minutes if BG  0- 149 no insulin  150 - 199 1 unit  200 - 249 2 units 250 - 299 3 units 300 - 349 4 units 350 - 399 5 units > 400 6 units and contact clinic [phone]      Name: Alyssa Ross Date of Birth: 1966-03-07 Report Period: 10/31/2023 - 11/13/2023 (14 days) Generated: 11/13/2023 Time CGM Active: 97%   Glucose Statistics and Targets Average Glucose: 282 mg/dL Glucose Management Indicator (GMI): 10.1% Glucose Variability (%CV): 32.6% Target Range: 70 - 180 mg/dL   Time in Ranges Very High: >250 mg/dL --- 38% High: 182 - 993 mg/dL --- 71% Target Range: 70 - 180 mg/dL --- 69% Low: 54 - 69 mg/dL --- 0% Very Low: <67 mg/dL --- 0%   Patient reports hypoglycemic s/sx including  dizziness, shakiness, sweating. Patient reports hyperglycemic symptoms including nocturia  Current meal patterns:  Patient said her eating is "all over the place"  due to issues with nausea, stomach issues, and taste aversion due to chemo. But she reported really trying to decrease carbohydrate intake.  But she describe her normal breakfast of a  bagel, sweet drink, [place] and egg which has around 70 gram of carb.  Not sure her Humalog  dose is high enough but patient has had quite a few lows as well.    She will see her Endocrinologist in August.  On statin therapy (Rosuvastatin  10 mg) Objective:  Lab Results  Component Value Date   HGBA1C 8.5 (H) 07/02/2023    Lab Results  Component Value Date   CREATININE 0.89 11/05/2023   BUN 6 11/05/2023   NA  144 11/05/2023   K 3.3 (L) 11/05/2023   CL 110 11/05/2023   CO2 23 11/05/2023    Lab Results  Component Value Date   CHOL 120 12/17/2022   HDL 44 12/17/2022   LDLCALC 62 12/17/2022   LDLDIRECT 97.7 02/11/2014   TRIG 68 12/17/2022   CHOLHDL 2.7 12/17/2022    Medications Reviewed Today     Reviewed by Geronimo Krabbe, [facility] (Pharmacist) on 11/13/23 at 0934  Med List Status: <None>   Medication Order Taking? Sig Documenting Provider Last Dose Status Informant  apixaban  (ELIQUIS ) 5 MG TABS tablet [id number] Yes Take 1 tablet (5 mg total) by mouth 2 (two) times daily. Sumner Ends, MD Taking Active   cetirizine (ZYRTEC) 10 MG tablet [id number] Yes Take 10 mg by mouth daily. [provider] Taking Active   Continuous Blood Gluc Sensor (FREESTYLE LIBRE 2 SENSOR) MISC [id number] Yes APPLY EVERY 14 DAYS Lajean Pike, MD Taking Active Self  glucose blood test strip [id number] Yes Check sugar 2 times daily. Lajean Pike, MD Taking Active Self  Insulin  Degludec (TRESIBA  FLEXTOUCH [place]) [id number] Yes Inject into the skin. 12-18 Units [provider] Taking Active            Med Note (SHEPHERD, CRYSTAL L   Wed Sep 24, 2023  9:50 AM) 25 units for a few days w/ steroids    17 units non-steroid  insulin  lispro (HUMALOG  KWIKPEN) 200 UNIT/ML KwikPen [id number] Yes 6 units with breakfast, 13 units with lunch, 16 units with dinner [provider] Taking Active            Med Note Geronimo Krabbe   Mon Oct 20, 2023  6:04 PM) Reported that her dose varies- 14-20 units with meals  Insulin  Pen Needle (BD PEN NEEDLE NANO U/F) 32G X 4 MM MISC [id number] Yes USE TO INJECT INSULIN  THREE TIMES A Luane Rumps, MD Taking Active   lidocaine -prilocaine  (EMLA ) cream [id number] No Apply 1 Application topically as directed. Apply 1 hour prior to stick and cover with plastic wrap  Patient not taking: Reported on 11/11/2023   Roseline Conine, NP Not Taking Active   magnesium  oxide (MAG-OX) 400 (240 Mg)  MG tablet [id number] Yes TAKE 1 TABLET(400 MG) BY MOUTH TWICE DAILY [name], Lisa K, NP Taking Active   ondansetron  (ZOFRAN ) 8 MG tablet [id number] Yes Take 1 tablet (8 mg total) by mouth every 8 (eight) hours as needed for nausea or vomiting (do not begin until 72 hours after chemo). Roseline Conine, NP Taking Active   Polyethylene Glycol 3350 (MIRALAX PO) [id number] Yes Take 17 g by mouth daily. [provider] Taking Active   potassium chloride  SA (KLOR-CON  M) 20 MEQ tablet [id number] Yes Take 1 tablet (20 mEq total) by mouth 4 (four) times daily. Roseline Conine, NP Taking Active   prochlorperazine  (COMPAZINE ) 10 MG tablet [id number] Yes TAKE 1 TABLET(10 MG) BY MOUTH EVERY 6 HOURS AS NEEDED FOR NAUSEA OR VOMITING Andy Bannister,  Shea Denier, NP Taking Active   rosuvastatin  (CRESTOR ) 10 MG tablet [id number] Yes TAKE 1 TABLET AT BEDTIME  Patient taking differently: Take 10 mg by mouth 3 (three) times a week.   Cleave Curling, MD Taking Active   spironolactone  (ALDACTONE ) 25 MG tablet [id number] Yes TAKE 1 TABLET(25 MG) BY MOUTH DAILY Cleave Curling, MD Taking Active            Med Note [facility], CATHERINE T   Thu Jul 03, 2023  2:08 PM) Takes when need based on fluid  valsartan  (DIOVAN ) 160 MG tablet [id number] Yes Take 1 tablet (160 mg total) by mouth daily. Cleave Curling, MD Taking Active            Med Note Acie Acosta, Raynald Calkins   Wed Jul 16, 2023  9:41 AM) PCP said to take 80 mg if needed              Assessment/Plan:   Diabetes: - Currently uncontrolled A1c from January was 8.5% however GMI from [facility] has consistently been >10% - Reviewed long term cardiovascular and renal outcomes of uncontrolled blood sugar - Reviewed goal A1c, goal fasting, and goal 2 hour post prandial glucose -Patient did not want to increase her insulin  regimen due to lows. - Recommend to switch Tresiba  dose to the morning versus evening.      Follow Up Plan:    Follow up with Patient in 1 week to see if lows improve  by switching the time of Tresiba  dosing.   Geronimo Krabbe, PharmD, BCACP Clinical Pharmacist [phone]

## 2023-11-17 ENCOUNTER — Ambulatory Visit (HOSPITAL_BASED_OUTPATIENT_CLINIC_OR_DEPARTMENT_OTHER)
Admission: RE | Admit: 2023-11-17 | Discharge: 2023-11-17 | Disposition: A | Discharge status: Home / Self Care | Source: Ambulatory Visit | Attending: Oncology | Admitting: Oncology

## 2023-11-17 DIAGNOSIS — C259 Malignant neoplasm of pancreas, unspecified: Secondary | ICD-10-CM | POA: Diagnosis not present

## 2023-11-17 DIAGNOSIS — I700 Atherosclerosis of aorta: Secondary | ICD-10-CM | POA: Diagnosis not present

## 2023-11-17 DIAGNOSIS — C250 Malignant neoplasm of head of pancreas: Secondary | ICD-10-CM | POA: Diagnosis not present

## 2023-11-17 DIAGNOSIS — Z9049 Acquired absence of other specified parts of digestive tract: Secondary | ICD-10-CM | POA: Diagnosis not present

## 2023-11-17 MED ORDER — IOHEXOL 300 MG/ML  SOLN
100.0000 mL | Freq: Once | INTRAMUSCULAR | Status: AC | PRN
  Administered 2023-11-17: 100 mL via INTRAVENOUS

## 2023-11-17 MED ORDER — SODIUM CHLORIDE 0.9% FLUSH: 10.0000 mL | INTRAVENOUS | Status: DC | PRN

## 2023-11-17 MED ORDER — HEPARIN SOD (PORK) LOCK FLUSH 100 UNIT/ML IV SOLN
500.0000 [IU] | Freq: Once | INTRAVENOUS | Status: AC
  Administered 2023-11-17: 500 [IU] via INTRAVENOUS

## 2023-11-19 ENCOUNTER — Inpatient Hospital Stay

## 2023-11-19 ENCOUNTER — Inpatient Hospital Stay (HOSPITAL_BASED_OUTPATIENT_CLINIC_OR_DEPARTMENT_OTHER): Admitting: Nurse Practitioner

## 2023-11-19 ENCOUNTER — Encounter: Payer: Self-pay | Admitting: Nurse Practitioner

## 2023-11-19 ENCOUNTER — Telehealth: Payer: Self-pay | Admitting: Pharmacist

## 2023-11-19 ENCOUNTER — Inpatient Hospital Stay: Attending: Genetic Counselor

## 2023-11-19 VITALS — BP 124/77 | HR 68 | Temp 98.1°F | Resp 16 | Ht 68.0 in | Wt 187.0 lb

## 2023-11-19 DIAGNOSIS — Z95828 Presence of other vascular implants and grafts: Secondary | ICD-10-CM

## 2023-11-19 DIAGNOSIS — C7802 Secondary malignant neoplasm of left lung: Secondary | ICD-10-CM | POA: Insufficient documentation

## 2023-11-19 DIAGNOSIS — C250 Malignant neoplasm of head of pancreas: Secondary | ICD-10-CM

## 2023-11-19 DIAGNOSIS — Z86718 Personal history of other venous thrombosis and embolism: Secondary | ICD-10-CM | POA: Diagnosis not present

## 2023-11-19 DIAGNOSIS — Z7901 Long term (current) use of anticoagulants: Secondary | ICD-10-CM | POA: Diagnosis not present

## 2023-11-19 DIAGNOSIS — C3411 Malignant neoplasm of upper lobe, right bronchus or lung: Secondary | ICD-10-CM | POA: Diagnosis not present

## 2023-11-19 DIAGNOSIS — C7801 Secondary malignant neoplasm of right lung: Secondary | ICD-10-CM | POA: Diagnosis not present

## 2023-11-19 DIAGNOSIS — E1140 Type 2 diabetes mellitus with diabetic neuropathy, unspecified: Secondary | ICD-10-CM | POA: Diagnosis not present

## 2023-11-19 DIAGNOSIS — K5900 Constipation, unspecified: Secondary | ICD-10-CM | POA: Diagnosis not present

## 2023-11-19 DIAGNOSIS — Z79899 Other long term (current) drug therapy: Secondary | ICD-10-CM | POA: Diagnosis not present

## 2023-11-19 DIAGNOSIS — C259 Malignant neoplasm of pancreas, unspecified: Secondary | ICD-10-CM | POA: Diagnosis not present

## 2023-11-19 DIAGNOSIS — E1165 Type 2 diabetes mellitus with hyperglycemia: Secondary | ICD-10-CM

## 2023-11-19 DIAGNOSIS — E876 Hypokalemia: Secondary | ICD-10-CM

## 2023-11-19 DIAGNOSIS — I10 Essential (primary) hypertension: Secondary | ICD-10-CM | POA: Diagnosis not present

## 2023-11-19 DIAGNOSIS — Z5111 Encounter for antineoplastic chemotherapy: Secondary | ICD-10-CM | POA: Insufficient documentation

## 2023-11-19 LAB — CBC WITH DIFFERENTIAL (CANCER CENTER ONLY)
Abs Immature Granulocytes: 0 10*3/uL (ref 0.00–0.07)
Basophils Absolute: 0 10*3/uL (ref 0.0–0.1)
Basophils Relative: 1 %
Eosinophils Absolute: 0.1 10*3/uL (ref 0.0–0.5)
Eosinophils Relative: 3 %
HCT: 30.2 % — ABNORMAL LOW (ref 36.0–46.0)
Hemoglobin: 9.7 g/dL — ABNORMAL LOW (ref 12.0–15.0)
Immature Granulocytes: 0 %
Lymphocytes Relative: 22 %
Lymphs Abs: 0.8 10*3/uL (ref 0.7–4.0)
MCH: 33.1 pg (ref 26.0–34.0)
MCHC: 32.1 g/dL (ref 30.0–36.0)
MCV: 103.1 fL — ABNORMAL HIGH (ref 80.0–100.0)
Monocytes Absolute: 0.4 10*3/uL (ref 0.1–1.0)
Monocytes Relative: 11 %
Neutro Abs: 2.3 10*3/uL (ref 1.7–7.7)
Neutrophils Relative %: 63 %
Platelet Count: 325 10*3/uL (ref 150–400)
RBC: 2.93 MIL/uL — ABNORMAL LOW (ref 3.87–5.11)
RDW: 13.9 % (ref 11.5–15.5)
WBC Count: 3.6 10*3/uL — ABNORMAL LOW (ref 4.0–10.5)
nRBC: 0 % (ref 0.0–0.2)

## 2023-11-19 LAB — CMP (CANCER CENTER ONLY)
ALT: 41 U/L (ref 0–44)
AST: 44 U/L — ABNORMAL HIGH (ref 15–41)
Albumin: 3.5 g/dL (ref 3.5–5.0)
Alkaline Phosphatase: 302 U/L — ABNORMAL HIGH (ref 38–126)
Anion gap: 10 (ref 5–15)
BUN: 7 mg/dL (ref 6–20)
CO2: 21 mmol/L — ABNORMAL LOW (ref 22–32)
Calcium: 8.5 mg/dL — ABNORMAL LOW (ref 8.9–10.3)
Chloride: 111 mmol/L (ref 98–111)
Creatinine: 0.87 mg/dL (ref 0.44–1.00)
GFR, Estimated: >60 mL/min (ref >60)
Glucose, Bld: 173 mg/dL — ABNORMAL HIGH (ref 70–99)
Potassium: 3.3 mmol/L — ABNORMAL LOW (ref 3.5–5.1)
Sodium: 142 mmol/L (ref 135–145)
Total Bilirubin: 0.4 mg/dL (ref 0.0–1.2)
Total Protein: 6.3 g/dL — ABNORMAL LOW (ref 6.5–8.1)

## 2023-11-19 LAB — MAGNESIUM: Magnesium: 1.8 mg/dL (ref 1.7–2.4)

## 2023-11-19 MED ORDER — SODIUM CHLORIDE 0.9% FLUSH
10.0000 mL | INTRAVENOUS | Status: DC | PRN
  Administered 2023-11-19: 10 mL via INTRAVENOUS

## 2023-11-19 MED ORDER — SODIUM CHLORIDE 0.9 % IV SOLN
400.0000 mg/m2 | Freq: Once | INTRAVENOUS | Status: DC
  Filled 2023-11-19: qty 38.8

## 2023-11-19 MED ORDER — DEXAMETHASONE SODIUM PHOSPHATE 10 MG/ML IJ SOLN
10.0000 mg | Freq: Once | INTRAMUSCULAR | Status: AC
  Administered 2023-11-19: 10 mg via INTRAVENOUS
  Filled 2023-11-19: qty 1

## 2023-11-19 MED ORDER — ATROPINE SULFATE 1 MG/ML IV SOLN
0.5000 mg | Freq: Once | INTRAVENOUS | Status: AC | PRN
  Administered 2023-11-19: 0.5 mg via INTRAVENOUS
  Filled 2023-11-19: qty 1

## 2023-11-19 MED ORDER — PALONOSETRON HCL INJECTION 0.25 MG/5ML
0.2500 mg | Freq: Once | INTRAVENOUS | Status: AC
  Administered 2023-11-19: 0.25 mg via INTRAVENOUS
  Filled 2023-11-19: qty 5

## 2023-11-19 MED ORDER — SODIUM CHLORIDE 0.9 % IV SOLN: INTRAVENOUS | Status: DC

## 2023-11-19 MED ORDER — SODIUM CHLORIDE 0.9 % IV SOLN
55.0000 mg/m2 | Freq: Once | INTRAVENOUS | Status: AC
  Administered 2023-11-19: 107.5 mg via INTRAVENOUS
  Filled 2023-11-19: qty 25

## 2023-11-19 MED ORDER — SODIUM CHLORIDE 0.9 % IV SOLN
2400.0000 mg/m2 | INTRAVENOUS | Status: DC
  Administered 2023-11-19: 5000 mg via INTRAVENOUS
  Filled 2023-11-19: qty 100

## 2023-11-19 NOTE — Progress Notes (Signed)
 Patient seen by Diana Forster NP today  Vitals are within treatment parameters:Yes   Labs are within treatment parameters: Yes K+ 3.3  Treatment plan has been signed: Yes   Per physician team, Patient is ready for treatment and there are NO modifications to the treatment plan.

## 2023-11-19 NOTE — Telephone Encounter (Signed)
-----   Message from Sung Engels sent at 11/11/2023  2:02 PM EDT ----- Regarding: FW: Follow up on log, blood sugars, exercise and decrease sugary drinks  ----- Message ----- From: Geronimo Krabbe, RPH Sent: 11/11/2023   8:00 AM EDT To: Geronimo Krabbe, RPH Subject: FW: Follow up on log, blood sugars, exercise#   ----- Message ----- From: Geronimo Krabbe, RPH Sent: 10/27/2023   8:00 AM EDT To: Geronimo Krabbe, RPH Subject: FW: Follow up on log, blood sugars, exercise#   ----- Message ----- From: Geronimo Krabbe, [facility] Sent: 10/22/2023  12:00 AM EDT To: Geronimo Krabbe, RPH Subject: FW: Follow up on log, blood sugars, exercise#   ----- Message ----- From: Geronimo Krabbe, [facility] Sent: 09/02/2023  12:00 AM EDT To: Geronimo Krabbe, RPH Subject: Follow up on log, blood sugars, exercise

## 2023-11-19 NOTE — Progress Notes (Signed)
 Patients port flushed without difficulty.  Good blood return noted with no bruising or swelling noted at site.  Patient remains accessed for treatment.

## 2023-11-19 NOTE — Progress Notes (Signed)
   11/19/2023  Patient ID: Alyssa Ross, female   DOB: [birth year], 58 y.o.   MRN: [id number]  Called Patient to follow up on blood sugars. Unfortunately, she did not answer her phone. HIPAA compliant message was left on her voicemail.  Reviewed the Patient's blood sugars on the [facility] Portal:  Name: Alyssa Ross Date of Birth: 05/05/1966 Report Period: 11/06/2023 - 11/19/2023 (14 days) Generated: 11/19/2023 Time CGM Active: 98%   Glucose Statistics and Targets Average Glucose: 247 mg/dL Glucose Management Indicator (GMI): 9.2% Glucose Variability (%CV): 39.0% Target Range: 70 - 180 mg/dL   Time in Ranges Very High: >250 mg/dL --- 40% High: 981 - 191 mg/dL --- 47% Target Range: 70 - 180 mg/dL --- 82% Low: 54 - 69 mg/dL --- 0% Very Low: <95 mg/dL --- 0%   Her current therapy includes:  Current medications:   Basal/bolus Tresiba  and Humalog    She is using a sliding scale with a 1:7 correction factor: (From Endocrinologist with [facility])   Tresiba  17 units daily except 25 units on steroid days Humalog  8 units with low carb meals (or use 1:7 ratio )  14 units with high carb meals   Correction scale Blood sugar 30 minutes if BG  0- 149 no insulin  150 - 199 1 unit  200 - 249 2 units 250 - 299 3 units 300 - 349 4 units 350 - 399 5 units > 400 6 units and contact clinic [phone]  During our conversation last week, we changed her Tresiba  dose from bedtime to the morning.  Her GMI decreased from 10.1 to 9.2 per libreview   Plan: Will call Patient back next week.   Geronimo Krabbe, PharmD, BCACP Clinical Pharmacist [phone]

## 2023-11-19 NOTE — Progress Notes (Signed)
 Patient presents today for chemotherapy infusion of Irinotecan  Liposome, Leucovorin , and Adrucil . . Patient is in satisfactory condition with no new complaints voiced.  Vital signs are stable.  Labs reviewed by Diana Forster NP during the office visit and all labs are within treatment parameters.  We will proceed with treatment per MD orders.   Patient tolerated treatment well with no complaints voiced.  Patient left ambulatory in stable condition.  Vital signs stable at discharge.  Follow up as scheduled.

## 2023-11-19 NOTE — Patient Instructions (Signed)
 [facility] - A DEPT OF [facility]  Discharge Instructions: Thank you for choosing  Cancer Center to provide your oncology and hematology care.   If you have a lab appointment with the Cancer Center, please go directly to the Cancer Center and check in at the registration area.   Wear comfortable clothing and clothing appropriate for easy access to any Portacath or PICC line.   We strive to give you quality time with your provider. You may need to reschedule your appointment if you arrive late (15 or more minutes).  Arriving late affects you and other patients whose appointments are after yours.  Also, if you miss three or more appointments without notifying the office, you may be dismissed from the clinic at the provider's discretion.      For prescription refill requests, have your pharmacy contact our office and allow 72 hours for refills to be completed.    Today you received the following chemotherapy and/or immunotherapy agents Irinotecan  Liposome, Leucovorin , and Adrucil .  Irinotecan  Liposome Injection What is this medication? IRINOTECAN  LIPOSOME (eye ri noe TEE kan LIP oh som) treats pancreatic cancer. It works by slowing down the growth of cancer cells. This medicine may be used for other purposes; ask your health care provider or pharmacist if you have questions. COMMON BRAND NAME(S): ONIVYDE  What should I tell my care team before I take this medication? They need to know if you have any of these conditions: Blockage in your bowels Dehydration Infection Low white blood cell levels Lung disease An unusual or allergic reaction to irinotecan  liposome, irinotecan , other medications, foods, dyes, or preservatives Pregnant or trying to get pregnant Breast-feeding How should I use this medication? This medication is injected into a vein. It is given by your care team in a hospital or clinic setting. Talk to your care team about the use of  this medication in children. Special care may be needed. Overdosage: If you think you have taken too much of this medicine contact a poison control center or emergency room at once. NOTE: This medicine is only for you. Do not share this medicine with others. What if I miss a dose? Keep appointments for follow-up doses. It is important not to miss your dose. Call your care team if you are unable to keep an appointment. What may interact with this medication? Do not take this medication with any of the following: Itraconazole This medication may also interact with the following: Certain antivirals for HIV or AIDS Certain medications for seizures, such as carbamazepine, fosphenytoin, phenytoin, phenobarbital Clarithromycin Gemfibrozil Nefazodone Rifabutin Rifampin Rifapentine St. John's Wort Voriconazole This list may not describe all possible interactions. Give your health care provider a list of all the medicines, herbs, non-prescription drugs, or dietary supplements you use. Also tell them if you smoke, drink alcohol, or use illegal drugs. Some items may interact with your medicine. What should I watch for while using this medication? This medication may make you feel generally unwell. This is not uncommon as chemotherapy can affect healthy cells as well as cancer cells. Report any side effects. Continue your course of treatment even though you feel ill unless your care team tells you to stop. You may need blood work while you are taking this medication. This medication can cause serious side effects and allergic reactions. To reduce your risk, your care team may give you other medications to take before receiving this one. Be sure to follow the directions from  your care team. Check with your care team if you get an attack of diarrhea, nausea and vomiting, or if you sweat a lot. The loss of too much body fluid can make it dangerous for you to take this medication. This medication may cause  infertility. Talk to your care team if you are concerned about your fertility. Talk to your care team if you wish to become pregnant or if you think you might be pregnant. This medication can cause serious birth defects if taken during pregnancy or if you get pregnant within 7 months after stopping therapy. A negative pregnancy test is required before starting this medication. A reliable form of contraception is recommended while taking this medication and for 7 months after stopping it. Talk to your care team about reliable forms of contraception. Use a condom during sex and for 4 months after stopping therapy. Tell your care team right away if you think your partner might be pregnant. This medication can cause serious birth defects. Do not breast-feed while taking this medication and for 1 month after stopping therapy. This medication may increase your risk of getting an infection. Call your care team for advice if you get a fever, chills, sore throat, or other symptoms of a cold or flu. Do not treat yourself. Try to avoid being around people who are sick. Avoid taking medications that contain aspirin, acetaminophen , ibuprofen, naproxen, or ketoprofen unless instructed by your care team. These medications may hide a fever. Be careful brushing or flossing your teeth or using a toothpick because you may get an infection or bleed more easily. If you have any dental work done, tell your dentist you are receiving this medication. What side effects may I notice from receiving this medication? Side effects that you should report to your care team as soon as possible: Allergic reactions or angioedema--skin rash, itching or hives, swelling of the face, eyes, lips, tongue, arms, or legs, trouble swallowing or breathing Dry cough, shortness of breath or trouble breathing Diarrhea Infection--fever, chills, cough, or sore throat Side effects that usually do not require medical attention (report to your care team  if they continue or are bothersome): Fatigue Loss of appetite Nausea Pain, redness, or swelling with sores inside the mouth or throat Vomiting This list may not describe all possible side effects. Call your doctor for medical advice about side effects. You may report side effects to FDA at 1-800-FDA-1088. Where should I keep my medication? This medication is given in a hospital or clinic. It will not be stored at home. NOTE: This sheet is a summary. It may not cover all possible information. If you have questions about this medicine, talk to your doctor, pharmacist, or health care provider.  2024 Elsevier/Gold Standard (2021-07-26 00:00:00)  Leucovorin  Injection What is this medication? LEUCOVORIN  (loo koe VOR in) prevents side effects from certain medications, such as methotrexate. It works by increasing folate levels. This helps protect healthy cells in your body. It may also be used to treat anemia caused by low levels of folate. It can also be used with fluorouracil , a type of chemotherapy, to treat colorectal cancer. It works by increasing the effects of fluorouracil  in the body. This medicine may be used for other purposes; ask your health care provider or pharmacist if you have questions. What should I tell my care team before I take this medication? They need to know if you have any of these conditions: Anemia from low levels of vitamin B12 in the blood An  unusual or allergic reaction to leucovorin , folic acid, other medications, foods, dyes, or preservatives Pregnant or trying to get pregnant Breastfeeding How should I use this medication? This medication is injected into a vein or a muscle. It is given by your care team in a hospital or clinic setting. Talk to your care team about the use of this medication in children. Special care may be needed. Overdosage: If you think you have taken too much of this medicine contact a poison control center or emergency room at once. NOTE:  This medicine is only for you. Do not share this medicine with others. What if I miss a dose? Keep appointments for follow-up doses. It is important not to miss your dose. Call your care team if you are unable to keep an appointment. What may interact with this medication? Capecitabine  Fluorouracil  Phenobarbital Phenytoin Primidone Trimethoprim;sulfamethoxazole This list may not describe all possible interactions. Give your health care provider a list of all the medicines, herbs, non-prescription drugs, or dietary supplements you use. Also tell them if you smoke, drink alcohol, or use illegal drugs. Some items may interact with your medicine. What should I watch for while using this medication? Your condition will be monitored carefully while you are receiving this medication. This medication may increase the side effects of 5-fluorouracil . Tell your care team if you have diarrhea or mouth sores that do not get better or that get worse. What side effects may I notice from receiving this medication? Side effects that you should report to your care team as soon as possible: Allergic reactions--skin rash, itching, hives, swelling of the face, lips, tongue, or throat This list may not describe all possible side effects. Call your doctor for medical advice about side effects. You may report side effects to FDA at 1-800-FDA-1088. Where should I keep my medication? This medication is given in a hospital or clinic. It will not be stored at home. NOTE: This sheet is a summary. It may not cover all possible information. If you have questions about this medicine, talk to your doctor, pharmacist, or health care provider.  2024 Elsevier/Gold Standard (2021-10-30 00:00:00)  Fluorouracil  Injection What is this medication? FLUOROURACIL  (flure oh YOOR a sil) treats some types of cancer. It works by slowing down the growth of cancer cells. This medicine may be used for other purposes; ask your health care  provider or pharmacist if you have questions. COMMON BRAND NAME(S): Adrucil  What should I tell my care team before I take this medication? They need to know if you have any of these conditions: Blood disorders Dihydropyrimidine dehydrogenase (DPD) deficiency Infection, such as chickenpox, cold sores, herpes Kidney disease Liver disease Poor nutrition Recent or ongoing radiation therapy An unusual or allergic reaction to fluorouracil , other medications, foods, dyes, or preservatives If you or your partner are pregnant or trying to get pregnant Breast-feeding How should I use this medication? This medication is injected into a vein. It is administered by your care team in a hospital or clinic setting. Talk to your care team about the use of this medication in children. Special care may be needed. Overdosage: If you think you have taken too much of this medicine contact a poison control center or emergency room at once. NOTE: This medicine is only for you. Do not share this medicine with others. What if I miss a dose? Keep appointments for follow-up doses. It is important not to miss your dose. Call your care team if you are unable to keep an  appointment. What may interact with this medication? Do not take this medication with any of the following: Live virus vaccines This medication may also interact with the following: Medications that treat or prevent blood clots, such as warfarin, enoxaparin , dalteparin This list may not describe all possible interactions. Give your health care provider a list of all the medicines, herbs, non-prescription drugs, or dietary supplements you use. Also tell them if you smoke, drink alcohol, or use illegal drugs. Some items may interact with your medicine. What should I watch for while using this medication? Your condition will be monitored carefully while you are receiving this medication. This medication may make you feel generally unwell. This is not  uncommon as chemotherapy can affect healthy cells as well as cancer cells. Report any side effects. Continue your course of treatment even though you feel ill unless your care team tells you to stop. In some cases, you may be given additional medications to help with side effects. Follow all directions for their use. This medication may increase your risk of getting an infection. Call your care team for advice if you get a fever, chills, sore throat, or other symptoms of a cold or flu. Do not treat yourself. Try to avoid being around people who are sick. This medication may increase your risk to bruise or bleed. Call your care team if you notice any unusual bleeding. Be careful brushing or flossing your teeth or using a toothpick because you may get an infection or bleed more easily. If you have any dental work done, tell your dentist you are receiving this medication. Avoid taking medications that contain aspirin, acetaminophen , ibuprofen, naproxen, or ketoprofen unless instructed by your care team. These medications may hide a fever. Do not treat diarrhea with over the counter products. Contact your care team if you have diarrhea that lasts more than 2 days or if it is severe and watery. This medication can make you more sensitive to the sun. Keep out of the sun. If you cannot avoid being in the sun, wear protective clothing and sunscreen. Do not use sun lamps, tanning beds, or tanning booths. Talk to your care team if you or your partner wish to become pregnant or think you might be pregnant. This medication can cause serious birth defects if taken during pregnancy and for 3 months after the last dose. A reliable form of contraception is recommended while taking this medication and for 3 months after the last dose. Talk to your care team about effective forms of contraception. Do not father a child while taking this medication and for 3 months after the last dose. Use a condom while having sex during this  time period. Do not breastfeed while taking this medication. This medication may cause infertility. Talk to your care team if you are concerned about your fertility. What side effects may I notice from receiving this medication? Side effects that you should report to your care team as soon as possible: Allergic reactions--skin rash, itching, hives, swelling of the face, lips, tongue, or throat Heart attack--pain or tightness in the chest, shoulders, arms, or jaw, nausea, shortness of breath, cold or clammy skin, feeling faint or lightheaded Heart failure--shortness of breath, swelling of the ankles, feet, or hands, sudden weight gain, unusual weakness or fatigue Heart rhythm changes--fast or irregular heartbeat, dizziness, feeling faint or lightheaded, chest pain, trouble breathing High ammonia level--unusual weakness or fatigue, confusion, loss of appetite, nausea, vomiting, seizures Infection--fever, chills, cough, sore throat, wounds that don't heal,  pain or trouble when passing urine, general feeling of discomfort or being unwell Low red blood cell level--unusual weakness or fatigue, dizziness, headache, trouble breathing Pain, tingling, or numbness in the hands or feet, muscle weakness, change in vision, confusion or trouble speaking, loss of balance or coordination, trouble walking, seizures Redness, swelling, and blistering of the skin over hands and feet Severe or prolonged diarrhea Unusual bruising or bleeding Side effects that usually do not require medical attention (report to your care team if they continue or are bothersome): Dry skin Headache Increased tears Nausea Pain, redness, or swelling with sores inside the mouth or throat Sensitivity to light Vomiting This list may not describe all possible side effects. Call your doctor for medical advice about side effects. You may report side effects to FDA at 1-800-FDA-1088. Where should I keep my medication? This medication is  given in a hospital or clinic. It will not be stored at home. NOTE: This sheet is a summary. It may not cover all possible information. If you have questions about this medicine, talk to your doctor, pharmacist, or health care provider.  2024 Elsevier/Gold Standard (2021-10-02 00:00:00)   To help prevent nausea and vomiting after your treatment, we encourage you to take your nausea medication as directed.  BELOW ARE SYMPTOMS THAT SHOULD BE REPORTED IMMEDIATELY: *FEVER GREATER THAN 100.4 F (38 C) OR HIGHER *CHILLS OR SWEATING *NAUSEA AND VOMITING THAT IS NOT CONTROLLED WITH YOUR NAUSEA MEDICATION *UNUSUAL SHORTNESS OF BREATH *UNUSUAL BRUISING OR BLEEDING *URINARY PROBLEMS (pain or burning when urinating, or frequent urination) *BOWEL PROBLEMS (unusual diarrhea, constipation, pain near the anus) TENDERNESS IN MOUTH AND THROAT WITH OR WITHOUT PRESENCE OF ULCERS (sore throat, sores in mouth, or a toothache) UNUSUAL RASH, SWELLING OR PAIN  UNUSUAL VAGINAL DISCHARGE OR ITCHING   Items with * indicate a potential emergency and should be followed up as soon as possible or go to the Emergency Department if any problems should occur.  Please show the CHEMOTHERAPY ALERT CARD or IMMUNOTHERAPY ALERT CARD at check-in to the Emergency Department and triage nurse.  Should you have questions after your visit or need to cancel or reschedule your appointment, please contact [facility] - A DEPT OF [facility]  Dept: [phone]  and follow the prompts.  Office hours are 8:00 a.m. to 4:30 p.m. Monday - Friday. Please note that voicemails left after 4:00 p.m. may not be returned until the following business day.  We are closed weekends and major holidays. You have access to a nurse at all times for urgent questions. Please call the main number to the clinic Dept: [phone] and follow the prompts.   For any non-urgent questions, you may also contact your provider using  MyChart. We now offer e-Visits for anyone 1 and older to request care online for non-urgent symptoms. For details visit [email or web address].   Also download the MyChart app! Go to the app store, search MyChart, open the app, select [facility], and log in with your MyChart username and password.

## 2023-11-19 NOTE — Progress Notes (Signed)
 [facility] OFFICE PROGRESS NOTE   Diagnosis: Pancreas cancer  INTERVAL HISTORY:   [name] returns as scheduled.  She completed another cycle of 5-FU/irinotecan  11/05/2023.  She denies nausea/vomiting.  No mouth sores.  No diarrhea.  No hand or foot pain or redness.  No abdominal pain.  Good appetite.  Objective:  Vital signs in last 24 hours:  Blood pressure 127/88, pulse 72, temperature 98.1 F (36.7 C), temperature source Temporal, resp. rate 18, height 5' 8 (1.727 m), weight 187 lb (84.8 kg), last menstrual period 09/17/2012, SpO2 100%.    HEENT: No thrush or ulcers.  Tongue with scattered areas of hyperpigmentation. Resp: Lungs clear bilaterally. Cardio: Regular rate and rhythm. GI: No hepatosplenomegaly.  Nontender. Vascular: No leg edema. Skin: Palms without erythema. Port-A-Cath without erythema.  Lab Results:  Lab Results  Component Value Date   WBC 3.6 (L) 11/19/2023   HGB 9.7 (L) 11/19/2023   HCT 30.2 (L) 11/19/2023   MCV 103.1 (H) 11/19/2023   PLT 325 11/19/2023   NEUTROABS 2.3 11/19/2023    Imaging:  CT CHEST ABDOMEN PELVIS W CONTRAST Result Date: 11/17/2023 CLINICAL DATA:  restaging metastatic pancreas cancer. * Tracking Code: BO * EXAM: CT CHEST, ABDOMEN, AND PELVIS WITH CONTRAST TECHNIQUE: Multidetector CT imaging of the chest, abdomen and pelvis was performed following the standard protocol during bolus administration of intravenous contrast. RADIATION DOSE REDUCTION: This exam was performed according to the departmental dose-optimization program which includes automated exposure control, adjustment of the mA and/or kV according to patient size and/or use of iterative reconstruction technique. CONTRAST:  OMNIPAQUE  IOHEXOL  300 MG/ML  SOLN COMPARISON:  CT scan chest, abdomen and pelvis from 08/20/2023. FINDINGS: CT CHEST FINDINGS Cardiovascular: Normal cardiac size. No pericardial effusion. No aortic aneurysm. There are mild peripheral  atherosclerotic vascular calcifications of thoracic aorta and its major branches. Mediastinum/Nodes: Visualized thyroid  gland appears grossly unremarkable. No solid / cystic mediastinal masses. The esophagus is nondistended precluding optimal assessment. No axillary, mediastinal or hilar lymphadenopathy by size criteria. Lungs/Pleura: The central tracheo-bronchial tree is patent. Redemonstration of multiple irregular thick walled cavitary lesions throughout bilateral lungs. Majority of the lesions are grossly unchanged or slightly decreased in size; however, there are few lesions for example, in the left lung upper lobe inferomedially (series 3, image 60), measuring 1.2 x 1.7 cm which has increased in size since the prior study and also exhibits interval opacification. There is a subpleural nodule in the right upper lobe lung apex, anteriorly (series 3, image 32), measuring 7 x 9 mm, which also exhibit interval opacification. There are multiple nodules (for example, in the bilateral lower lobes (series 3, images 87, 88, 93, etc. ), which appears decreased in size since the prior study. Overall, findings favor treatment response. No new mass or consolidation.  No pleural effusion or pneumothorax. Musculoskeletal: A CT Port-a-Cath is seen in the right upper chest wall with the catheter terminating in the cavo-atrial junction region. Visualized soft tissues of the chest wall are otherwise grossly unremarkable. No suspicious osseous lesions. There are mild multilevel degenerative changes in the visualized spine. CT ABDOMEN PELVIS FINDINGS Hepatobiliary: The liver is normal in size. Non-cirrhotic configuration. Redemonstration of peripheral wedge-shaped ill-defined hypoattenuating areas suggesting treatment response. No discrete new suspicious liver lesions seen. No intrahepatic or extrahepatic bile duct dilation. Gallbladder is surgically absent. Pancreas: Redemonstration of postsurgical changes from prior Whipple's  procedure. There is atrophic pancreatic body/tail with a stent within the pancreatic duct, unchanged. No suspicious mass  or peripancreatic fat stranding. Spleen: Within normal limits. No focal lesion. Adrenals/Urinary Tract: There is stable 1.3 x 2.5 cm indeterminate right adrenal nodule. Unremarkable left adrenal gland. Redemonstration of postsurgical changes in the right kidney upper pole anteriorly. No suspicious renal lesions seen. No nephroureterolithiasis or obstructive uropathy on either side. Urinary bladder is under distended, precluding optimal assessment. However, no large mass or stones identified. No perivesical fat stranding. Stomach/Bowel: No disproportionate dilation of the small or large bowel loops. No evidence of abnormal bowel wall thickening or inflammatory changes. The appendix was not visualized; however there is no acute inflammatory process in the right lower quadrant. There is moderate stool burden. There are postsurgical changes from prior Whipple's procedure. Vascular/Lymphatic: No ascites or pneumoperitoneum. No abdominal or pelvic lymphadenopathy, by size criteria. No aneurysmal dilation of the major abdominal arteries. There are mild peripheral atherosclerotic vascular calcifications of the aorta and its major branches. Reproductive: Redemonstration of bulky lobulated uterus containing multiple well-circumscribed isoattenuating, hyperattenuating and hypoattenuating masses, not well evaluated on the current exam but similar to the prior study favoring benign etiology such as leiomyomas. No large adnexal mass seen. Other: Midline surgical scar noted. There are soft tissue density areas in the anterior abdominal wall subcutaneous tissue, most likely due to medication injections. Musculoskeletal: No suspicious osseous lesions. There are mild multilevel degenerative changes in the visualized spine. IMPRESSION: 1. Redemonstration of multiple irregular thick walled cavitary lesions throughout  bilateral lungs. Majority of the lesions are grossly unchanged or slightly decreased in size; however, there are few lesions which exhibit interval opacification. Attention on follow-up examination is recommended. Overall, findings favor treatment response. No new mass or consolidation. 2. Redemonstration of peripheral wedge-shaped ill-defined hypoattenuating areas in the liver, suggesting treatment response. No discrete new suspicious liver lesion seen. 3. Stable indeterminate right adrenal nodule. 4. No new metastatic disease identified within the chest, abdomen or pelvis. 5. Multiple other nonacute observations, as described above. Aortic Atherosclerosis (ICD10-I70.0). Electronically Signed   By: Beula Brunswick M.D.   On: 11/17/2023 11:43    Medications: I have reviewed the patient's current medications.  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 Tumor invades the duodenal wall and vascular groove, resection margins negative, 4/34 lymph nodes positive MSI-stable, tumor showed instability in 2 loci as did adjacent normal tissue Foundation 1-K-ras G12 V, microsatellite status and tumor mutation burden cannot be determined EUS FNA biopsy of pancreas mass on 07/03/2018-well-differentiated neuroendocrine tumor CTs 01/29/2019-ill-defined pancreas head mass, 5 pulmonary nodules-1 with a small amount of central cavitation, tumor abuts the left margin of the portal vein indistinct density surrounding, hepatic artery, complex cystic lesion of the right kidney, right adrenal mass-characterized as an adenoma on a [facility] MRI 12/21/2018 [facility]  03/03/2019-no focal pancreas activity, no tracer accumulation within the suspicious pulmonary nodules, left uterine mass with tracer accumulation felt to represent a leiomyoma Elevated preoperative CA 19-9--CA 19-9 186 on 01/18/2019 CT chest 04/16/2019-multiple bilateral pulmonary nodules, some with increased  cavitation, stable in size Cycle 1 FOLFIRINOX 04/27/2019 Cycle 2 FOLFIRINOX 05/11/2019 Cycle 3 FOLFIRINOX 05/23/2019 Cycle 4 FOLFIRINOX 06/08/2019 Cycle 5 FOLFIRINOX 06/22/2019 CT chest 07/02/2019-stable size of bilateral pulmonary nodules.  Dominant cavitary lesions in both lungs show increased cavitation with thinner walls.  Stable 2.1 cm right adrenal nodule. Cycle 6 FOLFIRINOX 07/06/2019 Cycle 7 FOLFIRINOX 07/21/2019 Cycle 8 FOLFIRINOX 08/03/2019, oxaliplatin  deleted secondary to neuropathy CT chest 08/24/2019-decreased size of several lung nodules with resolution of a left upper lobe nodule, no new nodules Radiation to  the pancreas surgical area with concurrent Xeloda  09/13/2019-10/20/2019  CTs 11/29/2019-multiple small pulmonary nodules scattered throughout the lungs bilaterally, appear increased in number and size. No definite evidence of metastatic disease in the abdomen or pelvis. Markedly enlarged and heterogeneous appearing uterus, likely to represent multifocal fibroids. 1 of these lesions appears to be an exophytic subserosal fibroid in the posterior lateral aspect of the uterine body on the left side although this comes in close proximity to the left adnexa such that a primary ovarian lesion is difficult to completely exclude. CTs 02/07/2020-slight enlargement of bilateral lung nodules, some are cavitary, no evidence of metastatic disease in the abdomen or pelvis, stable right adrenal nodule, uterine fibroids CTs 04/26/2020-mild enlargement of pulmonary nodules, slight increase in trace pelvic fluid, new soft tissue thickening inferior to the cecal tip suspicious for peritoneal metastasis CT 05/26/2020-improved appearance of soft tissue at the inferior tip of the cecum, mildly thickened short appendix-findings suggestive of resolving appendicitis, stable small bibasilar pulmonary nodules, fibroids Plan biopsy of right cecal tip soft tissue canceled secondary to radiologic improvement CT chest  08/02/2020-enlargement and progressive cavitation of multiple bilateral lung nodules.  Some new nodules are present. CTs 10/24/2020- increase in size of pulmonary nodules, no new nodules, no evidence of metastatic disease in the abdomen, stable right adrenal nodule CT 01/09/2021-slight interval enlargement of pulmonary nodules, stable right adrenal nodule Navigation bronchoscopy 01/30/2021-left lower lobe cavitary nodule FNA-adenocarcinoma, brushing-adenocarcinoma.  Left lower lobe lavage-adenocarcinoma.  Right upper lobe brushing and FNA biopsy of cavitary nodule-adenocarcinoma-immunohistochemical profile consistent with pancreas adenocarcinoma Cycle 1 gemcitabine /Abraxane  03/28/2021 Cycle 2 gemcitabine /Abraxane  04/11/2021 Cycle 3 gemcitabine /Abraxane  04/25/2021 Cycle 4 gemcitabine /Abraxane  05/09/2021 Cycle 5 gemcitabine /Abraxane  05/23/2021 CT chest 06/05/2021-interval cavitation of multiple pulmonary nodules, some nodules have decreased in size, no new or enlarging nodules Cycle 6 gemcitabine /Abraxane  06/06/2021 Cycle 7 gemcitabine /Abraxane  06/21/2021 Cycle 8 gemcitabine /Abraxane  07/05/2021 Cycle 9 Gemcitabine /Abraxane  07/19/2021 Cycle 10 gemcitabine  08/01/2021-Abraxane  held secondary to neuropathy CT chest 08/13/2021-mild decrease in size and wall thickness of multiple cavitary nodules, no new or progressive disease in the chest, indeterminate low-attenuation right liver lesions Cycle 11 gemcitabine  08/16/2021-Abraxane  held secondary to neuropathy Cycle 12 gemcitabine  08/30/2021-Abraxane  held secondary to neuropathy Cycle 13 gemcitabine  09/13/2021-Abraxane  held secondary to neuropathy Cycle 14 gemcitabine  09/27/2021-Abraxane  held secondary to neuropathy Cycle 15 gemcitabine  10/11/2021-Abraxane  held secondary to neuropathy CTs 10/22/2021-no change in multiple cavitary lung nodules, no evidence of disease progression, ill-defined hypodense lesion in the posterior right liver suspicious for metastatic disease Cycle  16 gemcitabine  10/25/2021 Cycle 17 gemcitabine  11/08/2021 Cycle 18 Gemcitabine  11/22/2021 Cycle 19 gemcitabine  12/06/2021 Cycle 20 Gemcitabine  12/20/2021 CT 12/31/2021-mild increase in size of bilateral pulmonary metastases, stable subtle continuation right liver lesions Cycle 20 gemcitabine /Abraxane  01/03/2022 Cycle 21 gemcitabine /Abraxane  01/17/2022 Cycle 22 gemcitabine /Abraxane  01/31/2022 Cycle 23 gemcitabine /Abraxane  02/14/2022 Cycle 24 gemcitabine /Abraxane  02/28/2022 CTs 03/11/2022-widespread metastatic disease to the lungs again noted with slight involution of several of the pulmonary nodules, no definite new nodules noted; interval cavitation of solid lesion in the posterior aspect right lobe of the liver, no new liver lesions noted. Cycle 25 Gemcitabine /Abraxane  03/14/2022 Cycle 26 gemcitabine /Abraxane  03/28/2022 Cycle 27 gemcitabine /Abraxane  04/25/2022 Cycle 28 gemcitabine /Abraxane  05/09/2022 Cycle 29 Gemcitabine  05/23/2022, Abraxane  held due to neuropathy CTs 06/04/2022-stable multifocal cavitary pulmonary nodules.  No definite new nodules.  No new nodules greater than a centimeter.  Decreased size of the right posterior hepatic lobe lesion.  Generalized heterogeneity and new focal areas of hypodensity in the liver. Cycle 30 Gemcitabine  06/06/2022, Abraxane  held due to neuropathy Cycle 31 gemcitabine  06/20/2022, Abraxane  held due to  neuropathy Cycle 32 gemcitabine  07/04/2022, Abraxane  held due to neuropathy Cycle 33 gemcitabine   07/18/2022 ,Abraxane  held due to neuropathy Cycle 34 gemcitabine  08/15/2022, Abraxane  held due to neuropathy Cycle 35 gemcitabine  08/29/2022, Abraxane  held due to neuropathy CTs 09/10/2022-enlarging pulmonary metastases.  Subtle poorly defined hypoattenuating lesions in the liver, less well-seen than on 06/04/2022.  Suspected new lesion in the dome of the right hepatic lobe Cycle 36 gemcitabine /Abraxane  09/12/2022 Cycle 37 gemcitabine /Abraxane  09/26/2022 Cycle 38  gemcitabine /Abraxane  10/10/2022 Cycle 39 gemcitabine /Abraxane  11/07/2022 Cycle 40 gemcitabine /Abraxane  11/21/2022 Cycle 41 gemcitabine /Abraxane  12/05/2022 Cycle 42 gemcitabine /Abraxane  12/19/2022 CT is 12/30/2022-new left hepatic lesion, stable bilateral pulmonary metastases Cycle 43 gemcitabine /Abraxane  01/02/2023 CT-guided biopsy of liver lesion 01/27/2023-acute/chronic inflammation, negative for malignancy, culture negative Cycle 44 gemcitabine /Abraxane  01/30/2023 Cycle 45 gemcitabine /Abraxane  02/13/2023 Cycle 46 gemcitabine /Abraxane  02/27/2023 Cycle 47 gemcitabine /Abraxane  03/13/2023 Cycle 48 gemcitabine /Abraxane  03/27/2023 CTs 04/21/2023-numerous solid and cavitary lung nodules overall similar, some slightly enlarged; multiple new and enlarging rim-enhancing liver lesions. Cycle 1 FOLFIRI with liposomal irinotecan  05/21/2023 Cycle 2 held 06/05/2023 due to neutropenia Cycle 2 FOLFIRI with liposomal irinotecan  06/18/2023, irinotecan  dose reduced (white cell growth factor declined per patient) Cycle 3 FOLFIRI with liposomal irinotecan  07/02/2023 Cycle 4 FOLFIRI with liposomal irinotecan  07/16/2023 Cycle 5 FOLFIRI with liposomal irinotecan  07/30/2023 Cycle 6 FOLFIRI with liposomal irinotecan  08/13/2023 CTs 08/20/2023-numerous cavitary lesions seen in the lungs, extent and distribution appear similar.  A few lesions are slightly smaller.  Numerous liver lesions are now smaller.  Stable right adrenal nodule, adenoma favored. Cycle 7 FOLFIRI with liposomal irinotecan  08/27/2023 Cycle 8 FOLFIRI with liposomal irinotecan  09/11/2023 Cycle 9 FOLFIRI with liposomal irinotecan  09/24/2023 Cycle 10 FOLFIRI with liposomal irinotecan  10/08/2023 Cycle 11 FOLFIRI with liposomal irinotecan  10/22/2023 Cycle 12 FOLFIRI with liposomal irinotecan  11/05/2023 CT 11/17/2023-majority of multiple irregular thick-walled cavitary lesions throughout bilateral lungs grossly unchanged or slightly decreased in size.  Redemonstration of peripheral  wedge-shaped ill-defined hypoattenuating areas in the liver suggesting treatment response.  No discrete new suspicious liver lesions seen.  No new metastatic disease. 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  Apixaban  10/24/2022 Port-A-Cath malfunction 08/01/2023.  Port-A-Cath removed and replaced 08/08/2023.      Disposition: Alyssa Ross appears stable.  She has completed 13 cycles of FOLFIRI with liposomal irinotecan .  She continues to tolerate treatment well.  There is no clinical evidence of disease progression.  Recent restaging CTs stable to improved.  Report/images reviewed with her at today's visit.  She understands and agrees with the recommendation to continue current treatment.  Plan to proceed with cycle 14 today as scheduled.  CBC and chemistry panel reviewed.  Labs adequate for treatment.  She has mild hypokalemia.  She will increase Kdur by 1 tablet daily.  She will return for follow-up and treatment in 2 weeks.  We are available to see her sooner if needed.  Patient seen with Dr. Scherrie Curt.  Diana Forster ANP/GNP-BC   11/19/2023  9:09 AM  This was a shared  visit with Diana Forster.  We reviewed the restaging CT findings and images with Alyssa Ross.  The CTs are consistent with a clinical response to the current chemotherapy.  The plan is to continue 5-FU/liposomal irinotecan .  She will complete another cycle today.  I was present for greater than 50% of today's visit.  I performed medical decision making.  Anise Kerns, MD

## 2023-11-20 ENCOUNTER — Telehealth: Payer: Self-pay | Admitting: Pharmacist

## 2023-11-20 DIAGNOSIS — E1165 Type 2 diabetes mellitus with hyperglycemia: Secondary | ICD-10-CM

## 2023-11-20 LAB — CANCER ANTIGEN 19-9: CA 19-9: 123 U/mL — ABNORMAL HIGH (ref 0–35)

## 2023-11-20 NOTE — Progress Notes (Signed)
 11/21/2023 Name: Alyssa Ross MRN: [id number] DOB: Aug 19, 1965  Chief Complaint  Patient presents with   Medication Management    Diabetes     Alyssa Ross is a 58 y.o. year old female who presented for a telephone visit.   They were referred to the pharmacist by their PCP for assistance in managing diabetes.    Subjective: Alyssa Ross is a 58 year old female being followed for diabetes management. The purpose of today's call was to follow up after suggesting she inject Tresiba  in the morning versus bedtime.  She has a complex medical history that makes controlling her blood sugars difficult.  This history includes but is not limited to: pancreatic cancer, hypertension, hyperlipidemia, and type 2 diabetes.  She sees Dr. Sheryle Donning with [facility] who is an Endocrinologist and Dr. Scherrie Curt for Oncology.   Care Team: Primary Care Provider: Cleave Curling, MD ; Next Scheduled Visit: 12/22/23   Medication Access/Adherence  Current Pharmacy:  [id number] [place], [place] - [address] AT [facility] [address] [place] [address] Phone: [phone] Fax: [phone]  [facility] - [place], [place] - [address] [place] [address] Phone: [phone] Fax: [phone]  Melodee Spruce [facility] - [facility] [address] [place] [address] Phone: [phone] Fax: [phone]  [facility] [place], [place] - [address] [facility] [address] [place] [address] Phone: [phone] Fax: [phone]  [id number] - [place] Nest[place], [place] - [address] AT [facility] OF [address] [place] [address] Phone: [phone] Fax: [phone]   Patient reports affordability concerns with their medications: No  Patient reports access/transportation concerns to their pharmacy: No  Patient reports  adherence concerns with their medications:  No      Diabetes:  Current medications:  Current medications:   Basal/bolus Tresiba  and Humalog    She is using a sliding scale with a 1:7 correction factor: (From Endocrinologist with [facility])   Tresiba  17 units daily except 25 units on steroid days Humalog  8 units with low carb meals (or use 1:7 ratio )  14 units with high carb meals   Correction scale Blood sugar 30 minutes if BG  0- 149 no insulin  150 - 199 1 unit  200 - 249 2 units 250 - 299 3 units 300 - 349 4 units 350 - 399 5 units > 400 6 units and contact clinic [phone]  Current glucose readings:   Name: Alyssa Ross Date of Birth: [birth year] Report Period: 11/07/2023 - 11/20/2023 (14 days) Generated: 11/20/2023 Time CGM Active: 100%   Glucose Statistics and Targets Average Glucose: 246 mg/dL Glucose Management Indicator (GMI): 9.2% Glucose Variability (%CV): 39.6% Target Range: 70 - 180 mg/dL   Time in Ranges Very High: >250 mg/dL --- 42% High: 353 - 614 mg/dL --- 43% Target Range: 70 - 180 mg/dL --- 15% Low: 54 - 69 mg/dL --- 0% Very Low: <40 mg/dL --- 0%   Patient denies hypoglycemic s/sx including  dizziness, shakiness, sweating. Patient denies hyperglycemic symptoms including  polyuria, polydipsia, polyphagia, nocturia, neuropathy, blurred vision.  Patient also reported not eating the bagel and sweet drink in the mornings when she goes to work. We reviewed counting the carbs on this meal and she feels like she had been underestimating the number of carbs for her correction factor with her fast  acting insulin .   GM! Was down from >10 last week.  Objective:  Lab Results  Component Value Date   HGBA1C 8.5 (H) 07/02/2023    Lab Results  Component Value Date   CREATININE 0.87 11/19/2023   BUN 7 11/19/2023   NA 142 11/19/2023   K 3.3 (L) 11/19/2023   CL 111 11/19/2023   CO2 21 (L) 11/19/2023    Lab Results  Component Value Date   CHOL 120  12/17/2022   HDL 44 12/17/2022   LDLCALC 62 12/17/2022   LDLDIRECT 97.7 02/11/2014   TRIG 68 12/17/2022   CHOLHDL 2.7 12/17/2022    Medications Reviewed Today     Reviewed by Geronimo Krabbe, RPH (Pharmacist) on 11/21/23 at 1600  Med List Status: <None>   Medication Order Taking? Sig Documenting Provider Last Dose Status Informant  apixaban  (ELIQUIS ) 5 MG TABS tablet [id number] Yes Take 1 tablet (5 mg total) by mouth 2 (two) times daily. Sumner Ends, MD  Active   cetirizine (ZYRTEC) 10 MG tablet [id number] Yes Take 10 mg by mouth daily. [provider]  Active   Continuous Blood Gluc Sensor (FREESTYLE LIBRE 2 SENSOR) MISC [id number] Yes APPLY EVERY 14 DAYS Lajean Pike, MD  Active Self  glucose blood test strip [id number] Yes Check sugar 2 times daily. Lajean Pike, MD  Active Self  Insulin  Degludec (TRESIBA  FLEXTOUCH [place]) [id number] Yes Inject into the skin. 12-18 Units [provider]  Active            Med Note Antonieta Battle, CRYSTAL L   Wed Sep 24, 2023  9:50 AM) 25 units for a few days w/ steroids    17 units non-steroid  insulin  lispro (HUMALOG  KWIKPEN) 200 UNIT/ML KwikPen [id number] Yes 6 units with breakfast, 13 units with lunch, 16 units with dinner [provider]  Active            Med Note Georganna Kin Oct 20, 2023  6:04 PM) Reported that her dose varies- 14-20 units with meals  Insulin  Pen Needle (BD PEN NEEDLE NANO U/F) 32G X 4 MM MISC [id number] Yes USE TO INJECT INSULIN  THREE TIMES A Luane Rumps, MD  Active   lidocaine -prilocaine  (EMLA ) cream [id number]  Apply 1 Application topically as directed. Apply 1 hour prior to stick and cover with plastic wrap  Patient not taking: Reported on 11/21/2023   Roseline Conine, NP  Active   magnesium  oxide (MAG-OX) 400 (240 Mg) MG tablet [id number] Yes TAKE 1 TABLET(400 MG) BY MOUTH TWICE DAILY [name], Lisa K, NP  Active   ondansetron  (ZOFRAN ) 8 MG tablet [id number] Yes Take 1 tablet (8 mg total) by mouth every 8  (eight) hours as needed for nausea or vomiting (do not begin until 72 hours after chemo). [name], Lisa K, NP  Active   Polyethylene Glycol 3350 (MIRALAX PO) [id number] Yes Take 17 g by mouth daily. [provider]  Active   potassium chloride  SA (KLOR-CON  M) 20 MEQ tablet [id number] Yes Take 1 tablet (20 mEq total) by mouth 4 (four) times daily. Roseline Conine, NP  Active   prochlorperazine  (COMPAZINE ) 10 MG tablet [id number] Yes TAKE 1 TABLET(10 MG) BY MOUTH EVERY 6 HOURS AS NEEDED FOR NAUSEA OR VOMITING Roseline Conine, NP  Active   rosuvastatin  (CRESTOR ) 10 MG tablet [id number] Yes TAKE 1 TABLET AT BEDTIME  Patient taking differently: Take 10 mg by mouth 3 (three) times a week.   Cleave Curling,  MD  Active   sodium chloride  flush (NS) 0.9 % injection 10 mL [id number]   Roseline Conine, NP  Active   sodium chloride  flush (NS) 0.9 % injection 10 mL [id number]   Sumner Ends, MD  Active   spironolactone  (ALDACTONE ) 25 MG tablet [id number] Yes TAKE 1 TABLET(25 MG) BY MOUTH DAILY Cleave Curling, MD  Active            Med Note [facility], CATHERINE T   Thu Jul 03, 2023  2:08 PM) Takes when need based on fluid  valsartan  (DIOVAN ) 160 MG tablet [id number] Yes Take 1 tablet (160 mg total) by mouth daily. Cleave Curling, MD  Active            Med Note Acie Acosta, Raynald Calkins   Wed Jul 16, 2023  9:41 AM) PCP said to take 80 mg if needed              Assessment/Plan:   Diabetes: - Currently uncontrolled 8.5% (A1c) GMI now 9.2% - Reviewed long term cardiovascular and renal outcomes of uncontrolled blood sugar - Reviewed goal A1c, goal fasting, and goal 2 hour post prandial glucose - Recommend to continue current therapy   Follow Up Plan:    Call Patient back in 2 weeks.   Geronimo Krabbe, PharmD, BCACP Clinical Pharmacist [phone]

## 2023-11-21 ENCOUNTER — Inpatient Hospital Stay

## 2023-11-21 VITALS — BP 134/80 | HR 81 | Temp 98.1°F | Resp 16

## 2023-11-21 DIAGNOSIS — C250 Malignant neoplasm of head of pancreas: Secondary | ICD-10-CM

## 2023-11-21 DIAGNOSIS — I10 Essential (primary) hypertension: Secondary | ICD-10-CM | POA: Diagnosis not present

## 2023-11-21 DIAGNOSIS — K5900 Constipation, unspecified: Secondary | ICD-10-CM | POA: Diagnosis not present

## 2023-11-21 DIAGNOSIS — C7801 Secondary malignant neoplasm of right lung: Secondary | ICD-10-CM | POA: Diagnosis not present

## 2023-11-21 DIAGNOSIS — Z86718 Personal history of other venous thrombosis and embolism: Secondary | ICD-10-CM | POA: Diagnosis not present

## 2023-11-21 DIAGNOSIS — C7802 Secondary malignant neoplasm of left lung: Secondary | ICD-10-CM | POA: Diagnosis not present

## 2023-11-21 DIAGNOSIS — Z7901 Long term (current) use of anticoagulants: Secondary | ICD-10-CM | POA: Diagnosis not present

## 2023-11-21 DIAGNOSIS — E1140 Type 2 diabetes mellitus with diabetic neuropathy, unspecified: Secondary | ICD-10-CM | POA: Diagnosis not present

## 2023-11-21 DIAGNOSIS — Z79899 Other long term (current) drug therapy: Secondary | ICD-10-CM | POA: Diagnosis not present

## 2023-11-21 DIAGNOSIS — Z5111 Encounter for antineoplastic chemotherapy: Secondary | ICD-10-CM | POA: Diagnosis not present

## 2023-11-21 DIAGNOSIS — C3411 Malignant neoplasm of upper lobe, right bronchus or lung: Secondary | ICD-10-CM | POA: Diagnosis not present

## 2023-11-21 MED ORDER — SODIUM CHLORIDE 0.9% FLUSH
10.0000 mL | INTRAVENOUS | Status: DC | PRN
  Administered 2023-11-21: 10 mL

## 2023-11-21 MED ORDER — HEPARIN SOD (PORK) LOCK FLUSH 100 UNIT/ML IV SOLN
500.0000 [IU] | Freq: Once | INTRAVENOUS | Status: AC | PRN
  Administered 2023-11-21: 500 [IU]

## 2023-11-27 ENCOUNTER — Other Ambulatory Visit: Payer: Self-pay | Admitting: Internal Medicine

## 2023-11-27 ENCOUNTER — Other Ambulatory Visit: Payer: Self-pay

## 2023-11-27 DIAGNOSIS — D3A8 Other benign neuroendocrine tumors: Secondary | ICD-10-CM

## 2023-11-28 ENCOUNTER — Telehealth: Payer: Self-pay | Admitting: Pharmacist

## 2023-11-28 DIAGNOSIS — E1165 Type 2 diabetes mellitus with hyperglycemia: Secondary | ICD-10-CM

## 2023-11-28 NOTE — Progress Notes (Signed)
   11/28/2023  Patient ID: Alyssa Ross, female   DOB: 03/08/1966, 58 y.o.   MRN: [id number]  Patient was called to follow up on blood sugars. Unfortunately, she did not answer her phone.  HIPAA compliant message was left on her voicemail.    Reviewed the Patient's blood sugars on the [facility] Portal:  Name: Alyssa Ross Date of Birth: November 12, 1965 Report Period: 11/15/2023 - 11/28/2023 (14 days) Generated: 11/28/2023 Time CGM Active: 95%   Glucose Statistics and Targets Average Glucose: 258 mg/dL Glucose Management Indicator (GMI): 9.5% Glucose Variability (%CV): 38.3% Target Range: 70 - 180 mg/dL   Time in Ranges Very High: >250 mg/dL --- 40% High: 981 - 191 mg/dL --- 47% Target Range: 70 - 180 mg/dL --- 82% Low: 54 - 69 mg/dL --- 0% Very Low: <95 mg/dL --- 0%   Plan:  Call Patient back in 2 weeks.  Geronimo Krabbe, PharmD, BCACP Clinical Pharmacist [phone]

## 2023-11-30 ENCOUNTER — Other Ambulatory Visit: Payer: Self-pay | Admitting: Oncology

## 2023-11-30 DIAGNOSIS — C250 Malignant neoplasm of head of pancreas: Secondary | ICD-10-CM

## 2023-12-03 ENCOUNTER — Inpatient Hospital Stay

## 2023-12-03 ENCOUNTER — Inpatient Hospital Stay (HOSPITAL_BASED_OUTPATIENT_CLINIC_OR_DEPARTMENT_OTHER): Admitting: Nurse Practitioner

## 2023-12-03 ENCOUNTER — Encounter: Payer: Self-pay | Admitting: Nurse Practitioner

## 2023-12-03 VITALS — BP 112/76 | HR 69 | Temp 98.0°F | Resp 16 | Ht 68.0 in | Wt 185.2 lb

## 2023-12-03 DIAGNOSIS — E1140 Type 2 diabetes mellitus with diabetic neuropathy, unspecified: Secondary | ICD-10-CM | POA: Diagnosis not present

## 2023-12-03 DIAGNOSIS — C250 Malignant neoplasm of head of pancreas: Secondary | ICD-10-CM

## 2023-12-03 DIAGNOSIS — C7802 Secondary malignant neoplasm of left lung: Secondary | ICD-10-CM | POA: Diagnosis not present

## 2023-12-03 DIAGNOSIS — I10 Essential (primary) hypertension: Secondary | ICD-10-CM | POA: Diagnosis not present

## 2023-12-03 DIAGNOSIS — Z95828 Presence of other vascular implants and grafts: Secondary | ICD-10-CM

## 2023-12-03 DIAGNOSIS — C3411 Malignant neoplasm of upper lobe, right bronchus or lung: Secondary | ICD-10-CM | POA: Diagnosis not present

## 2023-12-03 DIAGNOSIS — C7801 Secondary malignant neoplasm of right lung: Secondary | ICD-10-CM | POA: Diagnosis not present

## 2023-12-03 DIAGNOSIS — D3A8 Other benign neuroendocrine tumors: Secondary | ICD-10-CM

## 2023-12-03 DIAGNOSIS — Z86718 Personal history of other venous thrombosis and embolism: Secondary | ICD-10-CM | POA: Diagnosis not present

## 2023-12-03 DIAGNOSIS — Z7901 Long term (current) use of anticoagulants: Secondary | ICD-10-CM | POA: Diagnosis not present

## 2023-12-03 DIAGNOSIS — Z79899 Other long term (current) drug therapy: Secondary | ICD-10-CM | POA: Diagnosis not present

## 2023-12-03 DIAGNOSIS — K5900 Constipation, unspecified: Secondary | ICD-10-CM | POA: Diagnosis not present

## 2023-12-03 DIAGNOSIS — C259 Malignant neoplasm of pancreas, unspecified: Secondary | ICD-10-CM | POA: Diagnosis not present

## 2023-12-03 DIAGNOSIS — Z5111 Encounter for antineoplastic chemotherapy: Secondary | ICD-10-CM | POA: Diagnosis not present

## 2023-12-03 LAB — CBC WITH DIFFERENTIAL (CANCER CENTER ONLY)
Abs Immature Granulocytes: 0.01 10*3/uL (ref 0.00–0.07)
Basophils Absolute: 0.1 10*3/uL (ref 0.0–0.1)
Basophils Relative: 2 %
Eosinophils Absolute: 0.1 10*3/uL (ref 0.0–0.5)
Eosinophils Relative: 2 %
HCT: 29.8 % — ABNORMAL LOW (ref 36.0–46.0)
Hemoglobin: 9.6 g/dL — ABNORMAL LOW (ref 12.0–15.0)
Immature Granulocytes: 0 %
Lymphocytes Relative: 29 %
Lymphs Abs: 1 10*3/uL (ref 0.7–4.0)
MCH: 33.1 pg (ref 26.0–34.0)
MCHC: 32.2 g/dL (ref 30.0–36.0)
MCV: 102.8 fL — ABNORMAL HIGH (ref 80.0–100.0)
Monocytes Absolute: 0.4 10*3/uL (ref 0.1–1.0)
Monocytes Relative: 11 %
Neutro Abs: 1.9 10*3/uL (ref 1.7–7.7)
Neutrophils Relative %: 56 %
Platelet Count: 336 10*3/uL (ref 150–400)
RBC: 2.9 MIL/uL — ABNORMAL LOW (ref 3.87–5.11)
RDW: 13.3 % (ref 11.5–15.5)
WBC Count: 3.4 10*3/uL — ABNORMAL LOW (ref 4.0–10.5)
nRBC: 0 % (ref 0.0–0.2)

## 2023-12-03 LAB — CMP (CANCER CENTER ONLY)
ALT: 30 U/L (ref 0–44)
AST: 24 U/L (ref 15–41)
Albumin: 3.6 g/dL (ref 3.5–5.0)
Alkaline Phosphatase: 372 U/L — ABNORMAL HIGH (ref 38–126)
Anion gap: 9 (ref 5–15)
BUN: 7 mg/dL (ref 6–20)
CO2: 21 mmol/L — ABNORMAL LOW (ref 22–32)
Calcium: 9 mg/dL (ref 8.9–10.3)
Chloride: 111 mmol/L (ref 98–111)
Creatinine: 0.9 mg/dL (ref 0.44–1.00)
GFR, Estimated: >60 mL/min (ref >60)
Glucose, Bld: 256 mg/dL — ABNORMAL HIGH (ref 70–99)
Potassium: 3.5 mmol/L (ref 3.5–5.1)
Sodium: 141 mmol/L (ref 135–145)
Total Bilirubin: 0.3 mg/dL (ref 0.0–1.2)
Total Protein: 6.5 g/dL (ref 6.5–8.1)

## 2023-12-03 LAB — LIPID PANEL
Cholesterol: 112 mg/dL (ref 0–200)
HDL: 41 mg/dL (ref >40)
LDL Cholesterol: 58 mg/dL (ref 0–99)
Total CHOL/HDL Ratio: 2.7 ratio
Triglycerides: 64 mg/dL (ref <150)
VLDL: 13 mg/dL (ref 0–40)

## 2023-12-03 LAB — HEMOGLOBIN A1C
Hgb A1c MFr Bld: 9.1 % — ABNORMAL HIGH (ref 4.8–5.6)
Mean Plasma Glucose: 214.47 mg/dL

## 2023-12-03 LAB — MAGNESIUM: Magnesium: 1.7 mg/dL (ref 1.7–2.4)

## 2023-12-03 MED ORDER — PALONOSETRON HCL INJECTION 0.25 MG/5ML
0.2500 mg | Freq: Once | INTRAVENOUS | Status: AC
  Administered 2023-12-03: 0.25 mg via INTRAVENOUS
  Filled 2023-12-03: qty 5

## 2023-12-03 MED ORDER — SODIUM CHLORIDE 0.9% FLUSH
10.0000 mL | Freq: Once | INTRAVENOUS | Status: AC
  Administered 2023-12-03: 10 mL

## 2023-12-03 MED ORDER — SODIUM CHLORIDE 0.9 % IV SOLN
55.0000 mg/m2 | Freq: Once | INTRAVENOUS | Status: AC
  Administered 2023-12-03: 107.5 mg via INTRAVENOUS
  Filled 2023-12-03: qty 25

## 2023-12-03 MED ORDER — ATROPINE SULFATE 1 MG/ML IV SOLN
0.5000 mg | Freq: Once | INTRAVENOUS | Status: AC | PRN
  Administered 2023-12-03: 0.5 mg via INTRAVENOUS
  Filled 2023-12-03: qty 1

## 2023-12-03 MED ORDER — DEXAMETHASONE SODIUM PHOSPHATE 10 MG/ML IJ SOLN
10.0000 mg | Freq: Once | INTRAMUSCULAR | Status: AC
  Administered 2023-12-03: 10 mg via INTRAVENOUS
  Filled 2023-12-03: qty 1

## 2023-12-03 MED ORDER — SODIUM CHLORIDE 0.9 % IV SOLN: INTRAVENOUS | Status: DC

## 2023-12-03 MED ORDER — SODIUM CHLORIDE 0.9 % IV SOLN
400.0000 mg/m2 | Freq: Once | INTRAVENOUS | Status: AC
  Administered 2023-12-03: 776 mg via INTRAVENOUS
  Filled 2023-12-03: qty 38.8

## 2023-12-03 MED ORDER — SODIUM CHLORIDE 0.9 % IV SOLN
2400.0000 mg/m2 | INTRAVENOUS | Status: DC
  Administered 2023-12-03: 5000 mg via INTRAVENOUS
  Filled 2023-12-03: qty 100

## 2023-12-03 NOTE — Patient Instructions (Signed)

## 2023-12-03 NOTE — Progress Notes (Signed)
 Patient seen by Lonna Cobb NP today  Vitals are within treatment parameters:Yes   Labs are within treatment parameters: Yes   Treatment plan has been signed: Yes   Per physician team, Patient is ready for treatment and there are NO modifications to the treatment plan.

## 2023-12-03 NOTE — Progress Notes (Signed)
 [facility] OFFICE PROGRESS NOTE   Diagnosis: Pancreas cancer  INTERVAL HISTORY:   Ms. Grist returns as scheduled.  She completed another cycle of 5-FU/irinotecan  11/19/2023.  She denies nausea/vomiting.  No mouth sores.  No diarrhea.  No hand or foot pain or redness.  She is constipated intermittently.  She takes MiraLAX.  Objective:  Vital signs in last 24 hours:  Blood pressure 112/76, pulse 69, temperature 98 F (36.7 C), temperature source Oral, resp. rate 16, height 5' 8 (1.727 m), weight 185 lb 3.2 oz (84 kg), last menstrual period 09/17/2012, SpO2 100%.    HEENT: No thrush or ulcers.  Tongue with scattered areas of hyperpigmentation. Resp: Lungs clear bilaterally. Cardio: Regular rate and rhythm. GI: No hepatosplenomegaly.  Nontender. Vascular: No leg edema. Skin: Palms with hyperpigmentation.  No erythema. Port-A-Cath without erythema.  Lab Results:  Lab Results  Component Value Date   WBC 3.4 (L) 12/03/2023   HGB 9.6 (L) 12/03/2023   HCT 29.8 (L) 12/03/2023   MCV 102.8 (H) 12/03/2023   PLT 336 12/03/2023   NEUTROABS 1.9 12/03/2023    Imaging:  No results found.  Medications: I have reviewed the patient's current medications.  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 Tumor invades the duodenal wall and vascular groove, resection margins negative, 4/34 lymph nodes positive MSI-stable, tumor showed instability in 2 loci as did adjacent normal tissue Foundation 1-K-ras G12 V, microsatellite status and tumor mutation burden cannot be determined EUS FNA biopsy of pancreas mass on 07/03/2018-well-differentiated neuroendocrine tumor CTs 01/29/2019-ill-defined pancreas head mass, 5 pulmonary nodules-1 with a small amount of central cavitation, tumor abuts the left margin of the portal vein indistinct density surrounding, hepatic artery, complex cystic lesion of the right kidney, right adrenal  mass-characterized as an adenoma on a [facility] MRI 12/21/2018 [facility]  03/03/2019-no focal pancreas activity, no tracer accumulation within the suspicious pulmonary nodules, left uterine mass with tracer accumulation felt to represent a leiomyoma Elevated preoperative CA 19-9--CA 19-9 186 on 01/18/2019 CT chest 04/16/2019-multiple bilateral pulmonary nodules, some with increased cavitation, stable in size Cycle 1 FOLFIRINOX 04/27/2019 Cycle 2 FOLFIRINOX 05/11/2019 Cycle 3 FOLFIRINOX 05/23/2019 Cycle 4 FOLFIRINOX 06/08/2019 Cycle 5 FOLFIRINOX 06/22/2019 CT chest 07/02/2019-stable size of bilateral pulmonary nodules.  Dominant cavitary lesions in both lungs show increased cavitation with thinner walls.  Stable 2.1 cm right adrenal nodule. Cycle 6 FOLFIRINOX 07/06/2019 Cycle 7 FOLFIRINOX 07/21/2019 Cycle 8 FOLFIRINOX 08/03/2019, oxaliplatin  deleted secondary to neuropathy CT chest 08/24/2019-decreased size of several lung nodules with resolution of a left upper lobe nodule, no new nodules Radiation to the pancreas surgical area with concurrent Xeloda  09/13/2019-10/20/2019  CTs 11/29/2019-multiple small pulmonary nodules scattered throughout the lungs bilaterally, appear increased in number and size. No definite evidence of metastatic disease in the abdomen or pelvis. Markedly enlarged and heterogeneous appearing uterus, likely to represent multifocal fibroids. 1 of these lesions appears to be an exophytic subserosal fibroid in the posterior lateral aspect of the uterine body on the left side although this comes in close proximity to the left adnexa such that a primary ovarian lesion is difficult to completely exclude. CTs 02/07/2020-slight enlargement of bilateral lung nodules, some are cavitary, no evidence of metastatic disease in the abdomen or pelvis, stable right adrenal nodule, uterine fibroids CTs 04/26/2020-mild enlargement of pulmonary nodules, slight increase in trace pelvic fluid, new soft tissue thickening  inferior to the cecal tip suspicious for peritoneal metastasis CT 05/26/2020-improved appearance of soft tissue at the inferior tip  of the cecum, mildly thickened short appendix-findings suggestive of resolving appendicitis, stable small bibasilar pulmonary nodules, fibroids Plan biopsy of right cecal tip soft tissue canceled secondary to radiologic improvement CT chest 08/02/2020-enlargement and progressive cavitation of multiple bilateral lung nodules.  Some new nodules are present. CTs 10/24/2020- increase in size of pulmonary nodules, no new nodules, no evidence of metastatic disease in the abdomen, stable right adrenal nodule CT 01/09/2021-slight interval enlargement of pulmonary nodules, stable right adrenal nodule Navigation bronchoscopy 01/30/2021-left lower lobe cavitary nodule FNA-adenocarcinoma, brushing-adenocarcinoma.  Left lower lobe lavage-adenocarcinoma.  Right upper lobe brushing and FNA biopsy of cavitary nodule-adenocarcinoma-immunohistochemical profile consistent with pancreas adenocarcinoma Cycle 1 gemcitabine /Abraxane  03/28/2021 Cycle 2 gemcitabine /Abraxane  04/11/2021 Cycle 3 gemcitabine /Abraxane  04/25/2021 Cycle 4 gemcitabine /Abraxane  05/09/2021 Cycle 5 gemcitabine /Abraxane  05/23/2021 CT chest 06/05/2021-interval cavitation of multiple pulmonary nodules, some nodules have decreased in size, no new or enlarging nodules Cycle 6 gemcitabine /Abraxane  06/06/2021 Cycle 7 gemcitabine /Abraxane  06/21/2021 Cycle 8 gemcitabine /Abraxane  07/05/2021 Cycle 9 Gemcitabine /Abraxane  07/19/2021 Cycle 10 gemcitabine  08/01/2021-Abraxane  held secondary to neuropathy CT chest 08/13/2021-mild decrease in size and wall thickness of multiple cavitary nodules, no new or progressive disease in the chest, indeterminate low-attenuation right liver lesions Cycle 11 gemcitabine  08/16/2021-Abraxane  held secondary to neuropathy Cycle 12 gemcitabine  08/30/2021-Abraxane  held secondary to neuropathy Cycle 13 gemcitabine   09/13/2021-Abraxane  held secondary to neuropathy Cycle 14 gemcitabine  09/27/2021-Abraxane  held secondary to neuropathy Cycle 15 gemcitabine  10/11/2021-Abraxane  held secondary to neuropathy CTs 10/22/2021-no change in multiple cavitary lung nodules, no evidence of disease progression, ill-defined hypodense lesion in the posterior right liver suspicious for metastatic disease Cycle 16 gemcitabine  10/25/2021 Cycle 17 gemcitabine  11/08/2021 Cycle 18 Gemcitabine  11/22/2021 Cycle 19 gemcitabine  12/06/2021 Cycle 20 Gemcitabine  12/20/2021 CT 12/31/2021-mild increase in size of bilateral pulmonary metastases, stable subtle continuation right liver lesions Cycle 20 gemcitabine /Abraxane  01/03/2022 Cycle 21 gemcitabine /Abraxane  01/17/2022 Cycle 22 gemcitabine /Abraxane  01/31/2022 Cycle 23 gemcitabine /Abraxane  02/14/2022 Cycle 24 gemcitabine /Abraxane  02/28/2022 CTs 03/11/2022-widespread metastatic disease to the lungs again noted with slight involution of several of the pulmonary nodules, no definite new nodules noted; interval cavitation of solid lesion in the posterior aspect right lobe of the liver, no new liver lesions noted. Cycle 25 Gemcitabine /Abraxane  03/14/2022 Cycle 26 gemcitabine /Abraxane  03/28/2022 Cycle 27 gemcitabine /Abraxane  04/25/2022 Cycle 28 gemcitabine /Abraxane  05/09/2022 Cycle 29 Gemcitabine  05/23/2022, Abraxane  held due to neuropathy CTs 06/04/2022-stable multifocal cavitary pulmonary nodules.  No definite new nodules.  No new nodules greater than a centimeter.  Decreased size of the right posterior hepatic lobe lesion.  Generalized heterogeneity and new focal areas of hypodensity in the liver. Cycle 30 Gemcitabine  06/06/2022, Abraxane  held due to neuropathy Cycle 31 gemcitabine  06/20/2022, Abraxane  held due to neuropathy Cycle 32 gemcitabine  07/04/2022, Abraxane  held due to neuropathy Cycle 33 gemcitabine   07/18/2022 ,Abraxane  held due to neuropathy Cycle 34 gemcitabine  08/15/2022, Abraxane  held due to  neuropathy Cycle 35 gemcitabine  08/29/2022, Abraxane  held due to neuropathy CTs 09/10/2022-enlarging pulmonary metastases.  Subtle poorly defined hypoattenuating lesions in the liver, less well-seen than on 06/04/2022.  Suspected new lesion in the dome of the right hepatic lobe Cycle 36 gemcitabine /Abraxane  09/12/2022 Cycle 37 gemcitabine /Abraxane  09/26/2022 Cycle 38 gemcitabine /Abraxane  10/10/2022 Cycle 39 gemcitabine /Abraxane  11/07/2022 Cycle 40 gemcitabine /Abraxane  11/21/2022 Cycle 41 gemcitabine /Abraxane  12/05/2022 Cycle 42 gemcitabine /Abraxane  12/19/2022 CT is 12/30/2022-new left hepatic lesion, stable bilateral pulmonary metastases Cycle 43 gemcitabine /Abraxane  01/02/2023 CT-guided biopsy of liver lesion 01/27/2023-acute/chronic inflammation, negative for malignancy, culture negative Cycle 44 gemcitabine /Abraxane  01/30/2023 Cycle 45 gemcitabine /Abraxane  02/13/2023 Cycle 46 gemcitabine /Abraxane  02/27/2023 Cycle 47 gemcitabine /Abraxane  03/13/2023 Cycle 48 gemcitabine /Abraxane  03/27/2023 CTs 04/21/2023-numerous solid and cavitary lung nodules overall similar, some slightly  enlarged; multiple new and enlarging rim-enhancing liver lesions. Cycle 1 FOLFIRI with liposomal irinotecan  05/21/2023 Cycle 2 held 06/05/2023 due to neutropenia Cycle 2 FOLFIRI with liposomal irinotecan  06/18/2023, irinotecan  dose reduced (white cell growth factor declined per patient) Cycle 3 FOLFIRI with liposomal irinotecan  07/02/2023 Cycle 4 FOLFIRI with liposomal irinotecan  07/16/2023 Cycle 5 FOLFIRI with liposomal irinotecan  07/30/2023 Cycle 6 FOLFIRI with liposomal irinotecan  08/13/2023 CTs 08/20/2023-numerous cavitary lesions seen in the lungs, extent and distribution appear similar.  A few lesions are slightly smaller.  Numerous liver lesions are now smaller.  Stable right adrenal nodule, adenoma favored. Cycle 7 FOLFIRI with liposomal irinotecan  08/27/2023 Cycle 8 FOLFIRI with liposomal irinotecan  09/11/2023 Cycle 9 FOLFIRI with  liposomal irinotecan  09/24/2023 Cycle 10 FOLFIRI with liposomal irinotecan  10/08/2023 Cycle 11 FOLFIRI with liposomal irinotecan  10/22/2023 Cycle 12 FOLFIRI with liposomal irinotecan  11/05/2023 CT 11/17/2023-majority of multiple irregular thick-walled cavitary lesions throughout bilateral lungs grossly unchanged or slightly decreased in size.  Redemonstration of peripheral wedge-shaped ill-defined hypoattenuating areas in the liver suggesting treatment response.  No discrete new suspicious liver lesions seen.  No new metastatic disease. 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  Apixaban  10/24/2022 Port-A-Cath malfunction 08/01/2023.  Port-A-Cath removed and replaced 08/08/2023.    Disposition: Ms. Pigeon appears stable.  She has completed 13 cycles of FOLFIRI with liposomal irinotecan .  She continues to tolerate treatment well.  There is no clinical evidence of disease progression.  Most recent CA 19-9 tumor marker was lower.  Plan to proceed with cycle 14 today as scheduled.  CBC and chemistry panel reviewed.  Labs adequate to  proceed with treatment.  She will return for follow-up and treatment in 3 weeks.  We are available to see her sooner if needed.  Olam Ned ANP/GNP-BC   12/03/2023  8:31 AM

## 2023-12-03 NOTE — Patient Instructions (Signed)
 [facility] - A DEPT OF [facility]  Discharge Instructions: Thank you for choosing  Cancer Center to provide your oncology and hematology care.   If you have a lab appointment with the Cancer Center, please go directly to the Cancer Center and check in at the registration area.   Wear comfortable clothing and clothing appropriate for easy access to any Portacath or PICC line.   We strive to give you quality time with your provider. You may need to reschedule your appointment if you arrive late (15 or more minutes).  Arriving late affects you and other patients whose appointments are after yours.  Also, if you miss three or more appointments without notifying the office, you may be dismissed from the clinic at the provider's discretion.      For prescription refill requests, have your pharmacy contact our office and allow 72 hours for refills to be completed.    Today you received the following chemotherapy and/or immunotherapy agents Irinotecan  Liposome, Leucovorin , and Adrucil .  Irinotecan  Liposome Injection What is this medication? IRINOTECAN  LIPOSOME (eye ri noe TEE kan LIP oh som) treats pancreatic cancer. It works by slowing down the growth of cancer cells. This medicine may be used for other purposes; ask your health care provider or pharmacist if you have questions. COMMON BRAND NAME(S): ONIVYDE  What should I tell my care team before I take this medication? They need to know if you have any of these conditions: Blockage in your bowels Dehydration Infection Low white blood cell levels Lung disease An unusual or allergic reaction to irinotecan  liposome, irinotecan , other medications, foods, dyes, or preservatives Pregnant or trying to get pregnant Breast-feeding How should I use this medication? This medication is injected into a vein. It is given by your care team in a hospital or clinic setting. Talk to your care team about the use of  this medication in children. Special care may be needed. Overdosage: If you think you have taken too much of this medicine contact a poison control center or emergency room at once. NOTE: This medicine is only for you. Do not share this medicine with others. What if I miss a dose? Keep appointments for follow-up doses. It is important not to miss your dose. Call your care team if you are unable to keep an appointment. What may interact with this medication? Do not take this medication with any of the following: Itraconazole This medication may also interact with the following: Certain antivirals for HIV or AIDS Certain medications for seizures, such as carbamazepine, fosphenytoin, phenytoin, phenobarbital Clarithromycin Gemfibrozil Nefazodone Rifabutin Rifampin Rifapentine St. John's Wort Voriconazole This list may not describe all possible interactions. Give your health care provider a list of all the medicines, herbs, non-prescription drugs, or dietary supplements you use. Also tell them if you smoke, drink alcohol, or use illegal drugs. Some items may interact with your medicine. What should I watch for while using this medication? This medication may make you feel generally unwell. This is not uncommon as chemotherapy can affect healthy cells as well as cancer cells. Report any side effects. Continue your course of treatment even though you feel ill unless your care team tells you to stop. You may need blood work while you are taking this medication. This medication can cause serious side effects and allergic reactions. To reduce your risk, your care team may give you other medications to take before receiving this one. Be sure to follow the directions from  your care team. Check with your care team if you get an attack of diarrhea, nausea and vomiting, or if you sweat a lot. The loss of too much body fluid can make it dangerous for you to take this medication. This medication may cause  infertility. Talk to your care team if you are concerned about your fertility. Talk to your care team if you wish to become pregnant or if you think you might be pregnant. This medication can cause serious birth defects if taken during pregnancy or if you get pregnant within 7 months after stopping therapy. A negative pregnancy test is required before starting this medication. A reliable form of contraception is recommended while taking this medication and for 7 months after stopping it. Talk to your care team about reliable forms of contraception. Use a condom during sex and for 4 months after stopping therapy. Tell your care team right away if you think your partner might be pregnant. This medication can cause serious birth defects. Do not breast-feed while taking this medication and for 1 month after stopping therapy. This medication may increase your risk of getting an infection. Call your care team for advice if you get a fever, chills, sore throat, or other symptoms of a cold or flu. Do not treat yourself. Try to avoid being around people who are sick. Avoid taking medications that contain aspirin, acetaminophen , ibuprofen, naproxen, or ketoprofen unless instructed by your care team. These medications may hide a fever. Be careful brushing or flossing your teeth or using a toothpick because you may get an infection or bleed more easily. If you have any dental work done, tell your dentist you are receiving this medication. What side effects may I notice from receiving this medication? Side effects that you should report to your care team as soon as possible: Allergic reactions or angioedema--skin rash, itching or hives, swelling of the face, eyes, lips, tongue, arms, or legs, trouble swallowing or breathing Dry cough, shortness of breath or trouble breathing Diarrhea Infection--fever, chills, cough, or sore throat Side effects that usually do not require medical attention (report to your care team  if they continue or are bothersome): Fatigue Loss of appetite Nausea Pain, redness, or swelling with sores inside the mouth or throat Vomiting This list may not describe all possible side effects. Call your doctor for medical advice about side effects. You may report side effects to FDA at 1-800-FDA-1088. Where should I keep my medication? This medication is given in a hospital or clinic. It will not be stored at home. NOTE: This sheet is a summary. It may not cover all possible information. If you have questions about this medicine, talk to your doctor, pharmacist, or health care provider.  2024 Elsevier/Gold Standard (2021-07-26 00:00:00)  Leucovorin  Injection What is this medication? LEUCOVORIN  (loo koe VOR in) prevents side effects from certain medications, such as methotrexate. It works by increasing folate levels. This helps protect healthy cells in your body. It may also be used to treat anemia caused by low levels of folate. It can also be used with fluorouracil , a type of chemotherapy, to treat colorectal cancer. It works by increasing the effects of fluorouracil  in the body. This medicine may be used for other purposes; ask your health care provider or pharmacist if you have questions. What should I tell my care team before I take this medication? They need to know if you have any of these conditions: Anemia from low levels of vitamin B12 in the blood An  unusual or allergic reaction to leucovorin , folic acid, other medications, foods, dyes, or preservatives Pregnant or trying to get pregnant Breastfeeding How should I use this medication? This medication is injected into a vein or a muscle. It is given by your care team in a hospital or clinic setting. Talk to your care team about the use of this medication in children. Special care may be needed. Overdosage: If you think you have taken too much of this medicine contact a poison control center or emergency room at once. NOTE:  This medicine is only for you. Do not share this medicine with others. What if I miss a dose? Keep appointments for follow-up doses. It is important not to miss your dose. Call your care team if you are unable to keep an appointment. What may interact with this medication? Capecitabine  Fluorouracil  Phenobarbital Phenytoin Primidone Trimethoprim;sulfamethoxazole This list may not describe all possible interactions. Give your health care provider a list of all the medicines, herbs, non-prescription drugs, or dietary supplements you use. Also tell them if you smoke, drink alcohol, or use illegal drugs. Some items may interact with your medicine. What should I watch for while using this medication? Your condition will be monitored carefully while you are receiving this medication. This medication may increase the side effects of 5-fluorouracil . Tell your care team if you have diarrhea or mouth sores that do not get better or that get worse. What side effects may I notice from receiving this medication? Side effects that you should report to your care team as soon as possible: Allergic reactions--skin rash, itching, hives, swelling of the face, lips, tongue, or throat This list may not describe all possible side effects. Call your doctor for medical advice about side effects. You may report side effects to FDA at 1-800-FDA-1088. Where should I keep my medication? This medication is given in a hospital or clinic. It will not be stored at home. NOTE: This sheet is a summary. It may not cover all possible information. If you have questions about this medicine, talk to your doctor, pharmacist, or health care provider.  2024 Elsevier/Gold Standard (2021-10-30 00:00:00)  Fluorouracil  Injection What is this medication? FLUOROURACIL  (flure oh YOOR a sil) treats some types of cancer. It works by slowing down the growth of cancer cells. This medicine may be used for other purposes; ask your health care  provider or pharmacist if you have questions. COMMON BRAND NAME(S): Adrucil  What should I tell my care team before I take this medication? They need to know if you have any of these conditions: Blood disorders Dihydropyrimidine dehydrogenase (DPD) deficiency Infection, such as chickenpox, cold sores, herpes Kidney disease Liver disease Poor nutrition Recent or ongoing radiation therapy An unusual or allergic reaction to fluorouracil , other medications, foods, dyes, or preservatives If you or your partner are pregnant or trying to get pregnant Breast-feeding How should I use this medication? This medication is injected into a vein. It is administered by your care team in a hospital or clinic setting. Talk to your care team about the use of this medication in children. Special care may be needed. Overdosage: If you think you have taken too much of this medicine contact a poison control center or emergency room at once. NOTE: This medicine is only for you. Do not share this medicine with others. What if I miss a dose? Keep appointments for follow-up doses. It is important not to miss your dose. Call your care team if you are unable to keep an  appointment. What may interact with this medication? Do not take this medication with any of the following: Live virus vaccines This medication may also interact with the following: Medications that treat or prevent blood clots, such as warfarin, enoxaparin , dalteparin This list may not describe all possible interactions. Give your health care provider a list of all the medicines, herbs, non-prescription drugs, or dietary supplements you use. Also tell them if you smoke, drink alcohol, or use illegal drugs. Some items may interact with your medicine. What should I watch for while using this medication? Your condition will be monitored carefully while you are receiving this medication. This medication may make you feel generally unwell. This is not  uncommon as chemotherapy can affect healthy cells as well as cancer cells. Report any side effects. Continue your course of treatment even though you feel ill unless your care team tells you to stop. In some cases, you may be given additional medications to help with side effects. Follow all directions for their use. This medication may increase your risk of getting an infection. Call your care team for advice if you get a fever, chills, sore throat, or other symptoms of a cold or flu. Do not treat yourself. Try to avoid being around people who are sick. This medication may increase your risk to bruise or bleed. Call your care team if you notice any unusual bleeding. Be careful brushing or flossing your teeth or using a toothpick because you may get an infection or bleed more easily. If you have any dental work done, tell your dentist you are receiving this medication. Avoid taking medications that contain aspirin, acetaminophen , ibuprofen, naproxen, or ketoprofen unless instructed by your care team. These medications may hide a fever. Do not treat diarrhea with over the counter products. Contact your care team if you have diarrhea that lasts more than 2 days or if it is severe and watery. This medication can make you more sensitive to the sun. Keep out of the sun. If you cannot avoid being in the sun, wear protective clothing and sunscreen. Do not use sun lamps, tanning beds, or tanning booths. Talk to your care team if you or your partner wish to become pregnant or think you might be pregnant. This medication can cause serious birth defects if taken during pregnancy and for 3 months after the last dose. A reliable form of contraception is recommended while taking this medication and for 3 months after the last dose. Talk to your care team about effective forms of contraception. Do not father a child while taking this medication and for 3 months after the last dose. Use a condom while having sex during this  time period. Do not breastfeed while taking this medication. This medication may cause infertility. Talk to your care team if you are concerned about your fertility. What side effects may I notice from receiving this medication? Side effects that you should report to your care team as soon as possible: Allergic reactions--skin rash, itching, hives, swelling of the face, lips, tongue, or throat Heart attack--pain or tightness in the chest, shoulders, arms, or jaw, nausea, shortness of breath, cold or clammy skin, feeling faint or lightheaded Heart failure--shortness of breath, swelling of the ankles, feet, or hands, sudden weight gain, unusual weakness or fatigue Heart rhythm changes--fast or irregular heartbeat, dizziness, feeling faint or lightheaded, chest pain, trouble breathing High ammonia level--unusual weakness or fatigue, confusion, loss of appetite, nausea, vomiting, seizures Infection--fever, chills, cough, sore throat, wounds that don't heal,  pain or trouble when passing urine, general feeling of discomfort or being unwell Low red blood cell level--unusual weakness or fatigue, dizziness, headache, trouble breathing Pain, tingling, or numbness in the hands or feet, muscle weakness, change in vision, confusion or trouble speaking, loss of balance or coordination, trouble walking, seizures Redness, swelling, and blistering of the skin over hands and feet Severe or prolonged diarrhea Unusual bruising or bleeding Side effects that usually do not require medical attention (report to your care team if they continue or are bothersome): Dry skin Headache Increased tears Nausea Pain, redness, or swelling with sores inside the mouth or throat Sensitivity to light Vomiting This list may not describe all possible side effects. Call your doctor for medical advice about side effects. You may report side effects to FDA at 1-800-FDA-1088. Where should I keep my medication? This medication is  given in a hospital or clinic. It will not be stored at home. NOTE: This sheet is a summary. It may not cover all possible information. If you have questions about this medicine, talk to your doctor, pharmacist, or health care provider.  2024 Elsevier/Gold Standard (2021-10-02 00:00:00)   To help prevent nausea and vomiting after your treatment, we encourage you to take your nausea medication as directed.  BELOW ARE SYMPTOMS THAT SHOULD BE REPORTED IMMEDIATELY: *FEVER GREATER THAN 100.4 F (38 C) OR HIGHER *CHILLS OR SWEATING *NAUSEA AND VOMITING THAT IS NOT CONTROLLED WITH YOUR NAUSEA MEDICATION *UNUSUAL SHORTNESS OF BREATH *UNUSUAL BRUISING OR BLEEDING *URINARY PROBLEMS (pain or burning when urinating, or frequent urination) *BOWEL PROBLEMS (unusual diarrhea, constipation, pain near the anus) TENDERNESS IN MOUTH AND THROAT WITH OR WITHOUT PRESENCE OF ULCERS (sore throat, sores in mouth, or a toothache) UNUSUAL RASH, SWELLING OR PAIN  UNUSUAL VAGINAL DISCHARGE OR ITCHING   Items with * indicate a potential emergency and should be followed up as soon as possible or go to the Emergency Department if any problems should occur.  Please show the CHEMOTHERAPY ALERT CARD or IMMUNOTHERAPY ALERT CARD at check-in to the Emergency Department and triage nurse.  Should you have questions after your visit or need to cancel or reschedule your appointment, please contact [facility] - A DEPT OF [facility]  Dept: [phone]  and follow the prompts.  Office hours are 8:00 a.m. to 4:30 p.m. Monday - Friday. Please note that voicemails left after 4:00 p.m. may not be returned until the following business day.  We are closed weekends and major holidays. You have access to a nurse at all times for urgent questions. Please call the main number to the clinic Dept: [phone] and follow the prompts.   For any non-urgent questions, you may also contact your provider using  MyChart. We now offer e-Visits for anyone 1 and older to request care online for non-urgent symptoms. For details visit [email or web address].   Also download the MyChart app! Go to the app store, search MyChart, open the app, select [facility], and log in with your MyChart username and password.

## 2023-12-04 ENCOUNTER — Telehealth: Payer: Self-pay

## 2023-12-04 LAB — CANCER ANTIGEN 19-9: CA 19-9: 108 U/mL — ABNORMAL HIGH (ref 0–35)

## 2023-12-04 NOTE — Telephone Encounter (Signed)
-----   Message from Olam Ned sent at 12/04/2023 12:24 PM EDT ----- Please let her know CA 19-9 is better.

## 2023-12-04 NOTE — Telephone Encounter (Signed)
 Patient gave verbal understanding, no further questions.

## 2023-12-05 ENCOUNTER — Inpatient Hospital Stay

## 2023-12-05 VITALS — BP 120/88 | HR 70 | Temp 98.5°F | Resp 16

## 2023-12-05 DIAGNOSIS — E1140 Type 2 diabetes mellitus with diabetic neuropathy, unspecified: Secondary | ICD-10-CM | POA: Diagnosis not present

## 2023-12-05 DIAGNOSIS — Z79899 Other long term (current) drug therapy: Secondary | ICD-10-CM | POA: Diagnosis not present

## 2023-12-05 DIAGNOSIS — C250 Malignant neoplasm of head of pancreas: Secondary | ICD-10-CM | POA: Diagnosis not present

## 2023-12-05 DIAGNOSIS — C3411 Malignant neoplasm of upper lobe, right bronchus or lung: Secondary | ICD-10-CM | POA: Diagnosis not present

## 2023-12-05 DIAGNOSIS — C7802 Secondary malignant neoplasm of left lung: Secondary | ICD-10-CM | POA: Diagnosis not present

## 2023-12-05 DIAGNOSIS — Z5111 Encounter for antineoplastic chemotherapy: Secondary | ICD-10-CM | POA: Diagnosis not present

## 2023-12-05 DIAGNOSIS — Z7901 Long term (current) use of anticoagulants: Secondary | ICD-10-CM | POA: Diagnosis not present

## 2023-12-05 DIAGNOSIS — K5900 Constipation, unspecified: Secondary | ICD-10-CM | POA: Diagnosis not present

## 2023-12-05 DIAGNOSIS — C7801 Secondary malignant neoplasm of right lung: Secondary | ICD-10-CM | POA: Diagnosis not present

## 2023-12-05 DIAGNOSIS — Z86718 Personal history of other venous thrombosis and embolism: Secondary | ICD-10-CM | POA: Diagnosis not present

## 2023-12-05 DIAGNOSIS — I10 Essential (primary) hypertension: Secondary | ICD-10-CM | POA: Diagnosis not present

## 2023-12-05 MED ORDER — HEPARIN SOD (PORK) LOCK FLUSH 100 UNIT/ML IV SOLN
500.0000 [IU] | Freq: Once | INTRAVENOUS | Status: AC | PRN
  Administered 2023-12-05: 500 [IU]

## 2023-12-05 MED ORDER — SODIUM CHLORIDE 0.9% FLUSH
10.0000 mL | INTRAVENOUS | Status: DC | PRN
  Administered 2023-12-05: 10 mL

## 2023-12-05 NOTE — Patient Instructions (Signed)

## 2023-12-16 ENCOUNTER — Telehealth: Payer: Self-pay | Admitting: Pharmacist

## 2023-12-16 DIAGNOSIS — E1165 Type 2 diabetes mellitus with hyperglycemia: Secondary | ICD-10-CM

## 2023-12-16 NOTE — Progress Notes (Signed)
   12/16/2023  Patient ID: Alyssa Ross, female   DOB: [birth year], 58 y.o.   MRN: [id number]  Cleda Imel was called to follow up on diabetes management.  HIPAA identifiers were obtained. The purpose of today's call was to follow up after suggesting she inject Tresiba  in the morning versus bedtime.  She has a complex medical history that makes controlling her blood sugars difficult.  This history includes but is not limited to: pancreatic cancer, hypertension, hyperlipidemia, and type 2 diabetes.  She sees Dr. Josette Sally with [facility] who is an Endocrinologist and Dr. Cloretta for Oncology.  She has an upcoming appointment to see Dr. Jarold on 12/22/23.   The Patient's blood glucose values were reviewed from her Freestyle Libre via the Libreview App:     Lipid Panel     Component Value Date/Time   CHOL 112 12/03/2023 0815   CHOL 120 12/17/2022 1208   TRIG 64 12/03/2023 0815   HDL 41 12/03/2023 0815   HDL 44 12/17/2022 1208   CHOLHDL 2.7 12/03/2023 0815   VLDL 13 12/03/2023 0815   LDLCALC 58 12/03/2023 0815   LDLCALC 62 12/17/2022 1208   LDLDIRECT 97.7 02/11/2014 1034   LABVLDL 14 12/17/2022 1208   Lab Results  Component Value Date   HGBA1C 9.1 (H) 12/03/2023   HGBA1C 8.5 (H) 07/02/2023   HGBA1C 9.3 (H) 12/17/2022    Rosuvastatin  10 mg filled 11/27/23 90 day supply Tresiba  filled 10/13/23 (injecting 17 units daily) Humalog  8 units with low carb meals (or use 1:7 ratio )  14 units with high carb meals   Correction scale Blood sugar 30 minutes if BG  0- 149 no insulin  150 - 199 1 unit  200 - 249 2 units 250 - 299 3 units 300 - 349 4 units 350 - 399 5 units > 400 6 units and contact clinic [phone]   Plan: Follow up with Patient in person during her appointment with Dr. Jarold on 12/22/23 Patient most likely needs more insulin  but she does not like that idea.  Humalog  has not been filled since last year.  Alyssa Ross, PharmD, BCACP Clinical  Pharmacist [phone]

## 2023-12-19 DIAGNOSIS — C259 Malignant neoplasm of pancreas, unspecified: Secondary | ICD-10-CM | POA: Diagnosis not present

## 2023-12-21 ENCOUNTER — Other Ambulatory Visit: Payer: Self-pay | Admitting: Oncology

## 2023-12-21 DIAGNOSIS — C250 Malignant neoplasm of head of pancreas: Secondary | ICD-10-CM

## 2023-12-22 ENCOUNTER — Encounter: Payer: Self-pay | Admitting: Internal Medicine

## 2023-12-22 ENCOUNTER — Inpatient Hospital Stay: Attending: Genetic Counselor | Admitting: Nurse Practitioner

## 2023-12-22 ENCOUNTER — Ambulatory Visit: Payer: Self-pay | Admitting: Pharmacist

## 2023-12-22 ENCOUNTER — Ambulatory Visit: Payer: BC Managed Care – PPO | Admitting: Internal Medicine

## 2023-12-22 ENCOUNTER — Ambulatory Visit: Payer: Self-pay | Admitting: Internal Medicine

## 2023-12-22 ENCOUNTER — Encounter: Payer: Self-pay | Admitting: Nurse Practitioner

## 2023-12-22 VITALS — BP 118/82 | HR 73 | Temp 97.9°F | Ht 68.0 in | Wt 191.8 lb

## 2023-12-22 DIAGNOSIS — Z515 Encounter for palliative care: Secondary | ICD-10-CM | POA: Diagnosis not present

## 2023-12-22 DIAGNOSIS — E7800 Pure hypercholesterolemia, unspecified: Secondary | ICD-10-CM | POA: Diagnosis not present

## 2023-12-22 DIAGNOSIS — K5903 Drug induced constipation: Secondary | ICD-10-CM

## 2023-12-22 DIAGNOSIS — G893 Neoplasm related pain (acute) (chronic): Secondary | ICD-10-CM

## 2023-12-22 DIAGNOSIS — I10 Essential (primary) hypertension: Secondary | ICD-10-CM | POA: Diagnosis not present

## 2023-12-22 DIAGNOSIS — E1165 Type 2 diabetes mellitus with hyperglycemia: Secondary | ICD-10-CM | POA: Diagnosis not present

## 2023-12-22 DIAGNOSIS — Z5111 Encounter for antineoplastic chemotherapy: Secondary | ICD-10-CM | POA: Insufficient documentation

## 2023-12-22 DIAGNOSIS — Z0000 Encounter for general adult medical examination without abnormal findings: Secondary | ICD-10-CM | POA: Diagnosis not present

## 2023-12-22 DIAGNOSIS — H6122 Impacted cerumen, left ear: Secondary | ICD-10-CM

## 2023-12-22 DIAGNOSIS — Z7189 Other specified counseling: Secondary | ICD-10-CM | POA: Diagnosis not present

## 2023-12-22 DIAGNOSIS — Z794 Long term (current) use of insulin: Secondary | ICD-10-CM

## 2023-12-22 DIAGNOSIS — Z905 Acquired absence of kidney: Secondary | ICD-10-CM | POA: Insufficient documentation

## 2023-12-22 DIAGNOSIS — C3411 Malignant neoplasm of upper lobe, right bronchus or lung: Secondary | ICD-10-CM | POA: Insufficient documentation

## 2023-12-22 DIAGNOSIS — Z85528 Personal history of other malignant neoplasm of kidney: Secondary | ICD-10-CM | POA: Insufficient documentation

## 2023-12-22 DIAGNOSIS — C250 Malignant neoplasm of head of pancreas: Secondary | ICD-10-CM

## 2023-12-22 DIAGNOSIS — E1140 Type 2 diabetes mellitus with diabetic neuropathy, unspecified: Secondary | ICD-10-CM | POA: Insufficient documentation

## 2023-12-22 DIAGNOSIS — Z79899 Other long term (current) drug therapy: Secondary | ICD-10-CM | POA: Insufficient documentation

## 2023-12-22 LAB — POCT URINALYSIS DIP (CLINITEK)
Bilirubin, UA: NEGATIVE
Blood, UA: NEGATIVE
Glucose, UA: NEGATIVE mg/dL
Ketones, POC UA: NEGATIVE mg/dL
Leukocytes, UA: NEGATIVE
Nitrite, UA: NEGATIVE
POC PROTEIN,UA: NEGATIVE
Spec Grav, UA: 1.015 (ref 1.010–1.025)
Urobilinogen, UA: 0.2 U/dL
pH, UA: 5.5 (ref 5.0–8.0)

## 2023-12-22 MED ORDER — HUMALOG KWIKPEN 200 UNIT/ML ~~LOC~~ SOPN: PEN_INJECTOR | SUBCUTANEOUS | Status: AC

## 2023-12-22 NOTE — Assessment & Plan Note (Signed)

## 2023-12-22 NOTE — Progress Notes (Unsigned)
 12/22/2023 Name: Alyssa Ross MRN: [id number] DOB: September 01, 1965  Chief Complaint  Patient presents with   Medication Management    Diabetes     Alyssa Ross is a 58 y.o. year old female who was referred for medication management by their primary care provider, Alyssa Medici, MD. They presented for a face to face visit today.   They were referred to the pharmacist by their PCP for assistance in managing diabetes   Patient has a complex medical history that makes controlling her blood sugars difficult.  This history includes but is not limited to: pancreatic cancer, hypertension, hyperlipidemia, and type 2 diabetes.  She sees Alyssa Ross with [facility] who is an Endocrinologist and Alyssa Ross for Oncology.    Subjective:  Care Team: Primary Care Provider: Jarold Medici, MD   Medication Access/Adherence  Current Pharmacy:  [facility] [phone] [place], [place] - [phone] AT [facility] OF [facility] & [address] [place] [address] Phone: [phone] Fax: [phone]  [facility] - Alyssa Ross, [place] - [address] [place] [address] Phone: [phone] Fax: [phone]  Alyssa Ross Phone: [phone] Fax: [phone]  [facility] [place], [place] - [address] [facility] [address] [place] [address] Phone: [phone] Fax: [phone]  [id number] - [place], [place] - [address] AT [facility] OF [address] [place] [address] Phone: [phone] Fax: [phone]   Patient reports affordability concerns with their medications: No  Patient reports access/transportation concerns to their pharmacy: No  Patient reports adherence concerns with their medications:  Yes  Reported not injecting Humalog  with carbohydrate laden snacks    We  reviewed her lunch from yesterday:  [employer] Large Iced Team -94 grams of Carb, Cabbage- 20 grams of carb, baked chicken- 0 carb-- total meal 114 g of carb--she had estimated the [employer] tea to be 40 grams of carb  Also she had a piece of strawberry shortcake from [employer] yesterday and did NOT inject insulin  prior to eating it.  When the nutritional value was reviewed, it had 71 grams of carbs.  Diabetes:    Objective:  Lab Results  Component Value Date   HGBA1C 9.1 (H) 12/03/2023    Lab Results  Component Value Date   CREATININE 0.90 12/03/2023   BUN 7 12/03/2023   NA 141 12/03/2023   Ross 3.5 12/03/2023   CL 111 12/03/2023   CO2 21 (L) 12/03/2023    Lab Results  Component Value Date   CHOL 112 12/03/2023   HDL 41 12/03/2023   LDLCALC 58 12/03/2023   LDLDIRECT 97.7 02/11/2014   TRIG 64 12/03/2023   CHOLHDL 2.7 12/03/2023    Medications Reviewed Today     Reviewed by Alyssa Ross, [facility] (Pharmacist) on 12/22/23 at 1244  Med List Status: <None>   Medication Order Taking? Sig Documenting Provider Last Dose Status Informant  apixaban  (ELIQUIS ) 5 MG TABS tablet [id number] Yes Take 1 tablet (5 mg total) by mouth 2 (two) times daily. Ross Arley NOVAK, MD  Active   cetirizine (ZYRTEC) 10 MG tablet [id number] Yes Take 10 mg by mouth daily. [provider]  Active   Continuous Blood Gluc Sensor (FREESTYLE LIBRE 2 SENSOR) MISC [id number] Yes APPLY EVERY 14 DAYS Alyssa Pacific, MD  Active Self  glucose blood test strip [id number] Yes Check sugar 2 times daily. Alyssa Pacific, MD  Active Self  Insulin  Degludec (TRESIBA  FLEXTOUCH [place]) [id number] Yes Inject into the skin. 12-18 Units [provider]  Active            Med Note LUIGI, CRYSTAL L   Wed Sep 24, 2023  9:50 AM) 25 units for a few days w/ steroids    17 units non-steroid  insulin  lispro (HUMALOG  KWIKPEN) 200 UNIT/ML KwikPen [id number] Yes 6 units with breakfast, 13 units with lunch, 16 units with dinner  [provider]  Active            Med Note MACK CASSIUS JINNY Pablo Oct 20, 2023  6:04 PM) Reported that her dose varies- 14-20 units with meals  Insulin  Pen Needle (BD PEN NEEDLE NANO U/F) 32G X 4 MM MISC [id number] Yes USE TO INJECT INSULIN  THREE TIMES A MICHAE Alyssa Pacific, MD  Active   lidocaine -prilocaine  (EMLA ) cream [id number] Yes Apply 1 Application topically as directed. Apply 1 hour prior to stick and cover with plastic wrap Alyssa Ross  Active   magnesium  oxide (MAG-OX) 400 (240 Mg) MG tablet [id number] Yes TAKE 1 TABLET(400 MG) BY MOUTH TWICE DAILY Alyssa Ross  Active   ondansetron  (ZOFRAN ) 8 MG tablet [id number] Yes Take 1 tablet (8 mg total) by mouth every 8 (eight) hours as needed for nausea or vomiting (do not begin until 72 hours after chemo). Alyssa Ross  Active   Polyethylene Glycol 3350 (MIRALAX PO) [id number] Yes Take 17 g by mouth daily. [provider]  Active   potassium chloride  SA (KLOR-CON  M) 20 MEQ tablet [id number] Yes Take 1 tablet (20 mEq total) by mouth 4 (four) times daily. Alyssa Ross  Active   prochlorperazine  (COMPAZINE ) 10 MG tablet [id number] Yes TAKE 1 TABLET(10 MG) BY MOUTH EVERY 6 HOURS AS NEEDED FOR NAUSEA OR VOMITING Alyssa Ross  Active   rosuvastatin  (CRESTOR ) 10 MG tablet [id number] Yes Take 1 tablet (10 mg total) by mouth 3 (three) times a week. Alyssa Medici, MD  Active   sodium chloride  flush (NS) 0.9 % injection 10 mL [id number]   Alyssa Ross  Active   spironolactone  (ALDACTONE ) 25 MG tablet [id number] Yes TAKE 1 TABLET(25 MG) BY MOUTH DAILY Alyssa Medici, MD  Active            Med Note [facility], Alyssa Ross   Thu Jul 03, 2023  2:08 PM) Takes when need based on fluid  valsartan  (DIOVAN ) 160 MG tablet [id number] Yes Take 1 tablet (160 mg total) by mouth daily. Alyssa Medici, MD  Active            Med Note Alyssa Ross   Wed Jul 16, 2023  9:41 AM) PCP said to take 80 mg if needed               Assessment/Plan:   Diabetes: - Currently uncontrolled - Reviewed Ross term cardiovascular and renal outcomes of uncontrolled blood sugar - Reviewed goal A1c, goal fasting, and goal 2 hour post prandial glucose - Recommend to use Humalog  with the sliding scale given to her by Endocrinology for high carb/high snacks   - Recommend to diligently count carbohydrates of her food intake  Follow Up Plan:     Follow current therapy: (From Endocrinology)  Tresiba  filled 10/13/23 (injecting 17 units daily) Humalog  8 units with low carb  meals (or use 1:7 ratio )  14 units with high carb meals   Correction scale Blood sugar 30 minutes if BG  0- 149 no insulin  150 - 199 1 unit  200 - 249 2 units 250 - 299 3 units 300 - 349 4 units 350 - 399 5 units > 400 6 units and contact clinic [phone]   Call Patient back in 2 weeks. If blood sugar control has not gotten better with consistent meal/snack time short acting insulin , increase her insulin  dose across the board.  Cassius DOROTHA Brought, PharmD, BCACP Clinical Pharmacist [phone]

## 2023-12-22 NOTE — Patient Instructions (Signed)

## 2023-12-22 NOTE — Progress Notes (Signed)
 I,[name], CMA,acting as a Neurosurgeon for Alyssa LOISE Slocumb, MD.,have documented all relevant documentation on the behalf of Alyssa LOISE Slocumb, MD,as directed by  Alyssa LOISE Slocumb, MD while in the presence of Alyssa LOISE Slocumb, MD.  Subjective:    Patient ID: Alyssa Ross , female    DOB: [birth year] , 58 y.o.   MRN: [id number]  Chief Complaint  Patient presents with   Annual Exam    Patient presents today for annual exam. She reports compliance with medications. Denies headache, chest pain & sob. GYN: Dr Armond. Letter sent to GYN for pap result.     Hypertension   Diabetes    HPI Discussed the use of AI scribe software for clinical note transcription with the patient, who gave verbal consent to proceed.  History of Present Illness Alyssa Ross is a 58 year old female with diabetes and cancer who presents for a physical exam and diabetes management.  Her glucose levels were elevated in June, with a lab result showing a glucose level of 256 mg/dL. She manages her diabetes with Tresiba , using 25 units when on steroids and 17 units when not, and Humalog  on a sliding scale based on her meals. She follows a 1:7 insulin  to carbohydrate ratio but does not always count carbohydrates accurately. She also takes potassium, rosuvastatin  10 mg three times a week, and spironolactone  as needed. She recently started taking valsartan  80 mg (half of a 160 mg pill) and attributes her need for it to eating out more frequently.  She started a new cancer treatment at the end of last year, resulting in a decrease in her tumor marker levels. 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  She has a history of endometrial bleeding last year, for which she underwent an endometrial biopsy and ultrasound. The ultrasound did not show a thickened lining. She has not had a period since she was 58 years  old.  She experiences constipation, which she attributes to medication given before her cancer treatment to prevent diarrhea. She takes Miralax daily to manage this, as missing a dose results in constipation and hemorrhoids.  She reports swelling, particularly after eating out, which she manages with spironolactone . Her kidney function is affected when she takes spironolactone  regularly. She has recently started exercising again, going to the gym for weight and cardio workouts, and plans to increase her physical activity to help with swelling and glucose levels.  No family history of breast cancer. Bowel movements are regular with the use of Miralax. 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   Diabetes She presents for her follow-up diabetic visit. She has type 2 diabetes mellitus. There are no hypoglycemic associated symptoms. Pertinent negatives for diabetes include no blurred vision, no chest pain, no polydipsia, no polyphagia and no polyuria. There are no hypoglycemic complications. Risk factors for coronary artery disease include diabetes mellitus, hypertension and dyslipidemia. Current diabetic treatment includes insulin  injections. She is compliant with treatment most of the time. She is following a diabetic diet. She participates in exercise intermittently. Eye exam is current.  Hypertension This is a chronic problem. The current episode started more than 1 year ago. The problem has been gradually improving since onset. The problem is uncontrolled. Pertinent negatives include no blurred vision, chest pain, palpitations or shortness of breath. Past treatments include ACE inhibitors and diuretics. The current treatment provides moderate improvement.  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   Yeast vaginitis 2006     Family History  Problem Relation Age of Onset   Diabetes Mother    Dementia Mother    Hyperlipidemia Mother    Hypertension Mother    Irregular heart beat Mother    Diabetes Father    Hypertension Father    Hyperlipidemia Father    Down syndrome Sister    Diabetes Sister    Hyperlipidemia Brother    Heart Problems Brother    Goiter Maternal Aunt    Thyroid  nodules Sister    Cancer Cousin 60       eye; maternal first cousin   Goiter Cousin    Cancer Paternal Aunt        unknown form of cancer   Cancer Cousin        unknown form of cancer; paternal first cousin   Pancreatic cancer Cousin 30       paternal first cousin   Cancer Cousin 16       unknown cancer; paternal first cousin   Cancer Cousin 27       unknown cancer; paternal first cousin     Current Outpatient Medications:    apixaban  (ELIQUIS ) 5 MG TABS tablet, Take 1 tablet (5 mg total) by mouth 2 (two) times daily., Disp: 180 tablet, Rfl: 2   cetirizine (ZYRTEC) 10 MG tablet, Take 10 mg by mouth daily., Disp: , Rfl:    Continuous Blood Gluc Sensor (FREESTYLE LIBRE 2 SENSOR) MISC, APPLY EVERY 14 DAYS, Disp: 2 each, Rfl: 5   glucose blood test strip, Check sugar 2 times daily., Disp: 100 each, Rfl: 12   Insulin  Degludec (TRESIBA  FLEXTOUCH [place]), Inject into the skin. 12-18 Units, Disp: , Rfl:    Insulin  Pen Needle (BD PEN NEEDLE NANO U/F) 32G X 4 MM MISC, USE TO INJECT INSULIN  THREE TIMES A DAY, Disp: 270 each, Rfl: 3   lidocaine -prilocaine  (EMLA )  cream, Apply 1 Application topically as directed. Apply 1 hour prior to stick and cover with plastic wrap, Disp: 30 g, Rfl: 5   magnesium  oxide (MAG-OX) 400 (240 Mg) MG tablet, TAKE 1 TABLET(400 MG) BY MOUTH TWICE DAILY, Disp: 180 tablet, Rfl: 1   ondansetron  (ZOFRAN ) 8 MG tablet, Take 1 tablet (8 mg total) by mouth every 8 (eight) hours as needed for nausea or vomiting (do not begin until 72 hours after chemo)., Disp: 20 tablet, Rfl: 3   Polyethylene Glycol 3350 (MIRALAX PO), Take 17 g by mouth daily., Disp: , Rfl:    potassium chloride  SA (KLOR-CON  M) 20 MEQ tablet, Take 1 tablet (20 mEq total) by mouth 4 (four) times daily., Disp: 360 tablet, Rfl: 1   prochlorperazine  (COMPAZINE ) 10 MG tablet, TAKE 1 TABLET(10 MG) BY MOUTH EVERY 6 HOURS AS NEEDED FOR  NAUSEA OR VOMITING (Patient not taking: Reported on 12/24/2023), Disp: 60 tablet, Rfl: 2   rosuvastatin  (CRESTOR ) 10 MG tablet, Take 1 tablet (10 mg total) by mouth 3 (three) times a week., Disp: 90 tablet, Rfl: 3   spironolactone  (ALDACTONE ) 25 MG tablet, TAKE 1 TABLET(25 MG) BY MOUTH DAILY, Disp: 90 tablet, Rfl: 2   valsartan  (DIOVAN ) 160 MG tablet, Take 1 tablet (160 mg total) by mouth daily., Disp: 90 tablet, Rfl: 3   insulin  lispro (HUMALOG  KWIKPEN) 200 UNIT/ML KwikPen, Humalog  8 units with low carb meals (or use 1:7 ratio )  14 units with high carb meals (and high carb snacks)  Correction scale Blood sugar 30 minutes if BG  0- 149 no insulin  150 - 199 1 unit  200 - 249 2 units 250 - 299 3 units 300 - 349 4 units 350 - 399 5 units > 400 6 units and contact clinic [phone], Disp: , Rfl:  No current facility-administered medications for this visit.  Facility-Administered Medications Ordered in Other Visits:    sodium chloride  flush (NS) 0.9 % injection 10 mL, 10 mL, Intravenous, PRN, Debby Olam POUR, NP, 10 mL at 10/24/22 1311   Allergies  Allergen Reactions   Cherry Rash, Itching and Other (See Comments)    Other reaction(s): Unknown Other  reaction(s): Unknown   Lemon Oil Rash      The patient states she uses none for birth control. Patient's last menstrual period was 09/17/2012.. Negative for Dysmenorrhea. Negative for: breast discharge, breast lump(s), breast pain and breast self exam. Associated symptoms include abnormal vaginal bleeding. Pertinent negatives include abnormal bleeding (hematology), anxiety, decreased libido, depression, difficulty falling sleep, dyspareunia, history of infertility, nocturia, sexual dysfunction, sleep disturbances, urinary incontinence, urinary urgency, vaginal discharge and vaginal itching. Diet regular.The patient states her exercise level is  intermittent.  . The patient's tobacco use is:  Social History   Tobacco Use  Smoking Status Never  Smokeless Tobacco Never  . She has been exposed to passive smoke. The patient's alcohol use is:  Social History   Substance and Sexual Activity  Alcohol Use Not Currently    Review of Systems  Constitutional: Negative.   HENT: Negative.    Eyes: Negative.  Negative for blurred vision.  Respiratory: Negative.  Negative for shortness of breath.   Cardiovascular: Negative.  Negative for chest pain and palpitations.  Gastrointestinal: Negative.   Endocrine: Negative.  Negative for polydipsia, polyphagia and polyuria.  Genitourinary: Negative.   Musculoskeletal: Negative.   Skin: Negative.   Allergic/Immunologic: Negative.   Neurological: Negative.   Hematological: Negative.   Psychiatric/Behavioral: Negative.       Today's Vitals   12/22/23 0928  BP: 118/82  Pulse: 73  Temp: 97.9 F (36.6 C)  SpO2: 98%  Weight: 191 lb 12.8 oz (87 kg)  Height: 5' 8 (1.727 m)   Body mass index is 29.16 kg/m.  Wt Readings from Last 3 Encounters:  12/24/23 184 lb 14.4 oz (83.9 kg)  12/22/23 191 lb 12.8 oz (87 kg)  12/03/23 185 lb 3.2 oz (84 kg)     BP Readings from Last 3 Encounters:  12/26/23 116/79  12/24/23 115/77  12/22/23 118/82     Objective:  Physical Exam Vitals and nursing note reviewed.  Constitutional:      Appearance: Normal appearance.  HENT:     Head: Normocephalic and atraumatic.     Right Ear: Tympanic membrane, ear canal and external ear normal.     Left Ear:  Tympanic membrane, ear canal and external ear normal.     Nose: Nose normal.     Mouth/Throat:     Mouth: Mucous membranes are moist.     Pharynx: Oropharynx is clear.  Eyes:     Extraocular Movements: Extraocular movements intact.     Conjunctiva/sclera: Conjunctivae normal.     Pupils: Pupils are equal, round, and reactive to light.  Cardiovascular:     Rate and Rhythm: Normal rate and regular rhythm.     Pulses: Normal pulses.          Dorsalis pedis pulses are 2+ on the right side and 2+ on the left side.     Heart sounds: Normal heart sounds.  Pulmonary:     Effort: Pulmonary effort is normal.     Breath sounds: Normal breath sounds.  Abdominal:     General: Abdomen is flat. Bowel sounds are normal.     Palpations: Abdomen is soft.     Comments: Healed surgical scar  Genitourinary:    Comments: deferred Musculoskeletal:        General: Normal range of motion.     Cervical back: Normal range of motion and neck supple.  Feet:     Right foot:     Protective Sensation: 5 sites tested.  5 sites sensed.     Skin integrity: Skin integrity normal.     Toenail Condition: Right toenails are normal.     Left foot:     Protective Sensation: 5 sites tested.  5 sites sensed.     Skin integrity: Skin integrity normal.     Toenail Condition: Left toenails are normal.  Skin:    General: Skin is warm and dry.     Comments: Hyperpigmented, feels dry  Neurological:     General: No focal deficit present.     Mental Status: She is alert and oriented to person, place, and time.  Psychiatric:        Mood and Affect: Mood normal.        Behavior: Behavior normal.         Assessment And Plan:     Encounter for general adult medical  examination w/o abnormal findings Assessment & Plan: A full exam was performed.  Importance of monthly self breast exams was discussed with the patient.  She is advised to get 30-45 minutes of regular exercise, no less than four to five days per week. Both weight-bearing and aerobic exercises are recommended.  She is advised to follow a healthy diet with at least six fruits/veggies per day, decrease intake of red meat and other saturated fats and to increase fish intake to twice weekly.  Meats/fish should not be fried -- baked, boiled or broiled is preferable. It is also important to cut back on your sugar intake.  Be sure to read labels - try to avoid anything with added sugar, high fructose corn syrup or other sweeteners.  If you must use a sweetener, you can try stevia or monkfruit.  It is also important to avoid artificially sweetened foods/beverages and diet drinks. Lastly, wear SPF 50 sunscreen on exposed skin and when in direct sunlight for an extended period of time.  Be sure to avoid fast food restaurants and aim for at least 60 ounces of water  daily.       Essential hypertension Assessment & Plan: Chronic, well controlled. EKG performed, NSR w/o acute changes. Blood pressure well-managed on valsartan  160 mg, taking half a pill as needed. Edema  likely due to dietary sodium intake, managed with spironolactone  as needed. - Continue valsartan  160 mg, taking half a pill as needed. - Continue spironolactone  as needed for edema.  Orders: -     EKG 12-Lead -     POCT URINALYSIS DIP (CLIN[facility]K) -     Microalbumin / creatinine urine ratio  Uncontrolled type 2 diabetes mellitus with hyperglycemia, with long-term current use of insulin  (HCC) Assessment & Plan: Chronic, diabetic foot exam was performed.  She will meet with [facility] pharmacist after our visit. [facility] Endo input is appreciated. Glucose levels elevated at 256 mg/dL. On Tresiba  and Humalog  with sliding scale. Discussed insulin  dosage  adjustment due to high glucose levels. Uses 1:7 insulin -to-carb ratio with inconsistent carb counting. - Continue Tresiba  17 units when not on steroids and 25 units when on steroids. - Continue Humalog  with sliding scale based on carbohydrate intake. - I DISCUSSED WITH THE PATIENT AT LENGTH REGARDING THE GOALS OF GLYCEMIC CONTROL AND POSSIBLE LONG-TERM COMPLICATIONS.  I  ALSO STRESSED THE IMPORTANCE OF COMPLIANCE WITH HOME GLUCOSE MONITORING, DIETARY RESTRICTIONS INCLUDING AVOIDANCE OF SUGARY DRINKS/PROCESSED FOODS,  ALONG WITH REGULAR EXERCISE.  I  ALSO STRESSED THE IMPORTANCE OF ANNUAL EYE EXAMS, SELF FOOT CARE AND COMPLIANCE WITH OFFICE VISITS.   Orders: -     EKG 12-Lead -     POCT URINALYSIS DIP (CLINITEK) -     Microalbumin / creatinine urine ratio  Pure hypercholesterolemia Assessment & Plan: Chronic, LDL goal is less than 70.  LDL cholesterol at goal on rosuvastatin . - Continue rosuvastatin  10 mg three times a week.   Malignant neoplasm of head of pancreas [facility]) Assessment & Plan: Oncology input is appreciated. Treatment plan as per Oncology: PANCREAS Liposomal Irinotecan  + Leucovorin  + 5-FU IVCI q14d.      Return for 1 year physical, 6 month bp. Patient was given opportunity to ask questions. Patient verbalized understanding of the plan and was able to repeat key elements of the plan. All questions were answered to their satisfaction.   I, Alyssa LOISE Slocumb, MD, have reviewed all documentation for this visit. The documentation on 12/22/23 for the exam, diagnosis, procedures, and orders are all accurate and complete.

## 2023-12-22 NOTE — Progress Notes (Addendum)
 Palliative Medicine [facility]  Telephone:(336) [phone] Fax:(336) [phone]   Name: Alyssa Ross Date: 12/22/2023 MRN: [id number]  DOB: 1965-08-10  Patient Care Team: Jarold Medici, MD as PCP - General (Internal Medicine) Luvenia Morrison, RN (Inactive) as Oncology Nurse Navigator Pickenpack-Cousar, Alyssa SAILOR, NP as Nurse Practitioner (Hospice and Palliative Medicine) Jolee Cassius PARAS, [facility] as Pharmacist (Pharmacist)   I connected with Alyssa Ross on 12/22/23 at  1:30 PM EDT by Phone and verified that I am speaking with the correct person using two identifiers.   I discussed the limitations, risks, security and privacy concerns of performing an evaluation and management service by telemedicine and the availability of in-person appointments. I also discussed with the patient that there may be a patient responsible charge related to this service. The patient expressed understanding and agreed to proceed.   Other persons participating in the visit and their role in the encounter: N/A   Patient's location: Home  Provider's location: [facility]    INTERVAL HISTORY: Alyssa Ross is a 58 y.o. female with oncologic medical history including pancreatic cancer (01/2019). The patient is 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 and is currently receiving FOLFIRI with liposomal irinotecan . Palliative ask to see for symptom management and goals of care.   SOCIAL HISTORY:     reports that she has never smoked. She has never used smokeless tobacco. She reports that she does not currently use alcohol. She reports that she does not use drugs.  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  MEDICATIONS:  Current Outpatient Medications  Medication Sig Dispense Refill   apixaban  (ELIQUIS ) 5 MG TABS tablet Take 1 tablet (5 mg total) by mouth 2 (two) times daily. 180 tablet 2   cetirizine (ZYRTEC) 10 MG tablet Take 10 mg by mouth daily.     Continuous Blood Gluc Sensor (FREESTYLE LIBRE 2 SENSOR) MISC APPLY EVERY 14 DAYS 2 each 5   glucose blood test strip Check sugar 2 times daily. 100 each 12   Insulin  Degludec (TRESIBA  FLEXTOUCH [place]) Inject into the skin. 12-18 Units     insulin  lispro (HUMALOG  KWIKPEN) 200 UNIT/ML KwikPen Humalog  8 units with low carb meals (or use 1:7 ratio )  14 units with high carb meals (and high carb snacks)  Correction scale Blood sugar 30 minutes if BG  0- 149 no insulin  150 - 199 1 unit  200 - 249 2 units 250 - 299 3 units 300 - 349 4 units 350 - 399 5 units > 400 6 units and contact clinic [phone]     Insulin  Pen Needle (BD PEN NEEDLE NANO  U/F) 32G X 4 MM MISC USE TO INJECT INSULIN  THREE TIMES A DAY 270 each 3   lidocaine -prilocaine  (EMLA ) cream Apply 1 Application topically as directed. Apply 1 hour prior to stick and cover with plastic wrap 30 g 5   magnesium  oxide (MAG-OX) 400 (240 Mg) MG tablet TAKE 1 TABLET(400 MG) BY MOUTH TWICE DAILY 180 tablet 1   ondansetron  (ZOFRAN ) 8 MG tablet Take 1 tablet (8 mg total) by mouth every 8 (eight) hours as needed for nausea or vomiting (do not begin until 72 hours after chemo). 20 tablet 3   Polyethylene Glycol 3350 (MIRALAX PO) Take 17 g by  mouth daily.     potassium chloride  SA (KLOR-CON  M) 20 MEQ tablet Take 1 tablet (20 mEq total) by mouth 4 (four) times daily. 360 tablet 1   prochlorperazine  (COMPAZINE ) 10 MG tablet TAKE 1 TABLET(10 MG) BY MOUTH EVERY 6 HOURS AS NEEDED FOR NAUSEA OR VOMITING 60 tablet 2   rosuvastatin  (CRESTOR ) 10 MG tablet Take 1 tablet (10 mg total) by mouth 3 (three) times a week. 90 tablet 3   spironolactone  (ALDACTONE ) 25 MG tablet TAKE 1 TABLET(25 MG) BY MOUTH DAILY 90 tablet 2   valsartan  (DIOVAN ) 160 MG tablet Take 1 tablet (160 mg total) by mouth daily. 90 tablet 3   No current facility-administered medications for this visit.   Facility-Administered Medications Ordered in Other Visits  Medication Dose Route Frequency Provider Last Rate Last Admin   sodium chloride  flush (NS) 0.9 % injection 10 mL  10 mL Intravenous PRN [name], Lisa K, NP   10 mL at 10/24/22 1311    VITAL SIGNS: LMP 09/17/2012  There were no vitals filed for this visit.  Estimated body mass index is 29.16 kg/m as calculated from the following:   Height as of an earlier encounter on 12/22/23: 5' 8 (1.727 m).   Weight as of an earlier encounter on 12/22/23: 191 lb 12.8 oz (87 kg).   PERFORMANCE STATUS (ECOG) : 1 - Symptomatic but completely ambulatory  IMPRESSION: Discussed the use of AI scribe software for clinical note transcription with the patient, who gave verbal consent to proceed.  History of Present Illness Alyssa Ross is a 58 year old female who I connected by phone with for follow-up. No acute distress. She is doing well overall. Taking things one day at a time.   Her treatment schedule has been adjusted from every two weeks to every three weeks, with the first three-week interval starting this week. She finds it encouraging that her numbers are decreasing.  [name] experiences constipation despite daily use of Miralax and occasionally has diarrhea if she consumes certain foods post-treatment. She is managing these  symptoms along with hemorrhoids. She has difficulty tolerating sugar since her surgery, a known potential outcome. Reports this intolerance is increasing.  She is managing diabetes and is actively trying to control it by moderating sugar intake.  She has been taking two days off work every month to maintain a better work-life balance, which she finds beneficial.  No uncontrolled symptoms at this time. We will continue to support and follow as needed.   Goals of Care Patient has completed advanced directives naming Mondalyn and Briceida Rasberry as her medical decision makers in the event she is unable to speak for herself.  No desire for artificial feedings.  No life prolonging measures in the event of a terminal state.  I discussed the importance of continued conversation with family and their medical  providers regarding overall plan of care and treatment options, ensuring decisions are within the context of the patients values and GOCs. Assessment & Plan Constipation  Chronic constipation due to atropine , with variable bowel movements. - Continue daily Miralax with dose adjustments based on bowel movement consistency. - Educated on regular dosing importance.  Hemorrhoids Occasional bleeding hemorrhoids, exacerbated by constipation, improved with bowel management. - Manage constipation to reduce hemorrhoid exacerbation. - Consider topical treatments for relief.  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  Goals of Care Advanced directive completed. MOST form pending completion. - Schedule call to complete MOST form in the upcoming weeks.  Cancer treatment Transitioning to treatment every three weeks with improved outcomes and manageable work-life balance.  - Schedule follow-up virtual appointment in six weeks.  Patient expressed understanding and was in agreement with this plan. She also understands that She can call the clinic at any time with any questions,  concerns, or complaints.   I provided 25 minutes of non face-to-face telephone visit time during this encounter, and > 50% was spent counseling as documented under my assessment & plan. Visit consisted of counseling and education dealing with the complex and emotionally intense issues of symptom management and palliative care in the setting of serious and potentially life-threatening illness.  Levon Borer, AGPCNP-BC  Palliative Medicine Team/[facility]

## 2023-12-23 ENCOUNTER — Telehealth: Payer: Self-pay | Admitting: Nurse Practitioner

## 2023-12-23 NOTE — Telephone Encounter (Signed)
 Spoke with patient confirming upcoming appointment

## 2023-12-24 ENCOUNTER — Inpatient Hospital Stay

## 2023-12-24 ENCOUNTER — Inpatient Hospital Stay (HOSPITAL_BASED_OUTPATIENT_CLINIC_OR_DEPARTMENT_OTHER): Admitting: Nurse Practitioner

## 2023-12-24 ENCOUNTER — Encounter: Payer: Self-pay | Admitting: Nurse Practitioner

## 2023-12-24 VITALS — BP 115/77 | HR 71 | Temp 97.9°F | Resp 16 | Ht 68.0 in | Wt 184.9 lb

## 2023-12-24 DIAGNOSIS — C250 Malignant neoplasm of head of pancreas: Secondary | ICD-10-CM

## 2023-12-24 DIAGNOSIS — I10 Essential (primary) hypertension: Secondary | ICD-10-CM | POA: Diagnosis not present

## 2023-12-24 DIAGNOSIS — C259 Malignant neoplasm of pancreas, unspecified: Secondary | ICD-10-CM | POA: Diagnosis not present

## 2023-12-24 DIAGNOSIS — Z5111 Encounter for antineoplastic chemotherapy: Secondary | ICD-10-CM | POA: Diagnosis not present

## 2023-12-24 DIAGNOSIS — Z85528 Personal history of other malignant neoplasm of kidney: Secondary | ICD-10-CM | POA: Diagnosis not present

## 2023-12-24 DIAGNOSIS — Z79899 Other long term (current) drug therapy: Secondary | ICD-10-CM | POA: Diagnosis not present

## 2023-12-24 DIAGNOSIS — E1140 Type 2 diabetes mellitus with diabetic neuropathy, unspecified: Secondary | ICD-10-CM | POA: Diagnosis not present

## 2023-12-24 DIAGNOSIS — Z905 Acquired absence of kidney: Secondary | ICD-10-CM | POA: Diagnosis not present

## 2023-12-24 DIAGNOSIS — C3411 Malignant neoplasm of upper lobe, right bronchus or lung: Secondary | ICD-10-CM | POA: Diagnosis not present

## 2023-12-24 LAB — CMP (CANCER CENTER ONLY)
ALT: 25 U/L (ref 0–44)
AST: 21 U/L (ref 15–41)
Albumin: 3.4 g/dL — ABNORMAL LOW (ref 3.5–5.0)
Alkaline Phosphatase: 279 U/L — ABNORMAL HIGH (ref 38–126)
Anion gap: 9 (ref 5–15)
BUN: 9 mg/dL (ref 6–20)
CO2: 25 mmol/L (ref 22–32)
Calcium: 8.6 mg/dL — ABNORMAL LOW (ref 8.9–10.3)
Chloride: 108 mmol/L (ref 98–111)
Creatinine: 1.04 mg/dL — ABNORMAL HIGH (ref 0.44–1.00)
GFR, Estimated: >60 mL/min (ref >60)
Glucose, Bld: 136 mg/dL — ABNORMAL HIGH (ref 70–99)
Potassium: 3.9 mmol/L (ref 3.5–5.1)
Sodium: 141 mmol/L (ref 135–145)
Total Bilirubin: 0.4 mg/dL (ref 0.0–1.2)
Total Protein: 6.5 g/dL (ref 6.5–8.1)

## 2023-12-24 LAB — CBC WITH DIFFERENTIAL (CANCER CENTER ONLY)
Abs Immature Granulocytes: 0.01 K/uL (ref 0.00–0.07)
Basophils Absolute: 0 K/uL (ref 0.0–0.1)
Basophils Relative: 1 %
Eosinophils Absolute: 0.1 K/uL (ref 0.0–0.5)
Eosinophils Relative: 5 %
HCT: 31.9 % — ABNORMAL LOW (ref 36.0–46.0)
Hemoglobin: 10.1 g/dL — ABNORMAL LOW (ref 12.0–15.0)
Immature Granulocytes: 0 %
Lymphocytes Relative: 39 %
Lymphs Abs: 1.2 K/uL (ref 0.7–4.0)
MCH: 32.9 pg (ref 26.0–34.0)
MCHC: 31.7 g/dL (ref 30.0–36.0)
MCV: 103.9 fL — ABNORMAL HIGH (ref 80.0–100.0)
Monocytes Absolute: 0.4 K/uL (ref 0.1–1.0)
Monocytes Relative: 14 %
Neutro Abs: 1.3 K/uL — ABNORMAL LOW (ref 1.7–7.7)
Neutrophils Relative %: 41 %
Platelet Count: 283 K/uL (ref 150–400)
RBC: 3.07 MIL/uL — ABNORMAL LOW (ref 3.87–5.11)
RDW: 13.5 % (ref 11.5–15.5)
WBC Count: 3.1 K/uL — ABNORMAL LOW (ref 4.0–10.5)
nRBC: 0 % (ref 0.0–0.2)

## 2023-12-24 LAB — MAGNESIUM: Magnesium: 2 mg/dL (ref 1.7–2.4)

## 2023-12-24 MED ORDER — SODIUM CHLORIDE 0.9 % IV SOLN
55.0000 mg/m2 | Freq: Once | INTRAVENOUS | Status: AC
  Administered 2023-12-24: 107.5 mg via INTRAVENOUS
  Filled 2023-12-24: qty 25

## 2023-12-24 MED ORDER — SODIUM CHLORIDE 0.9 % IV SOLN
400.0000 mg/m2 | Freq: Once | INTRAVENOUS | Status: AC
  Administered 2023-12-24: 776 mg via INTRAVENOUS
  Filled 2023-12-24: qty 38.8

## 2023-12-24 MED ORDER — SODIUM CHLORIDE 0.9 % IV SOLN
2400.0000 mg/m2 | INTRAVENOUS | Status: DC
  Administered 2023-12-24: 5000 mg via INTRAVENOUS
  Filled 2023-12-24: qty 100

## 2023-12-24 MED ORDER — SODIUM CHLORIDE 0.9 % IV SOLN: INTRAVENOUS | Status: DC

## 2023-12-24 MED ORDER — PALONOSETRON HCL INJECTION 0.25 MG/5ML
0.2500 mg | Freq: Once | INTRAVENOUS | Status: AC
  Administered 2023-12-24: 0.25 mg via INTRAVENOUS
  Filled 2023-12-24: qty 5

## 2023-12-24 MED ORDER — ATROPINE SULFATE 1 MG/ML IV SOLN
0.5000 mg | Freq: Once | INTRAVENOUS | Status: AC | PRN
  Administered 2023-12-24: 0.5 mg via INTRAVENOUS
  Filled 2023-12-24: qty 1

## 2023-12-24 MED ORDER — DEXAMETHASONE SODIUM PHOSPHATE 10 MG/ML IJ SOLN
10.0000 mg | Freq: Once | INTRAMUSCULAR | Status: AC
  Administered 2023-12-24: 10 mg via INTRAVENOUS
  Filled 2023-12-24: qty 1

## 2023-12-24 NOTE — Progress Notes (Signed)
 Patient seen by Olam Ned NP today  Vitals are within treatment parameters:Yes  Decreased 7 pounds Labs are within treatment parameters: ANC- 1.3, okay to proceed   Treatment plan has been signed: Yes   Per physician team, Patient is ready for treatment and there are NO modifications to the treatment plan.

## 2023-12-24 NOTE — Progress Notes (Signed)
 [facility] OFFICE PROGRESS NOTE   Diagnosis: Pancreas cancer  INTERVAL HISTORY:   Ms. Outten returns as scheduled.  She completed another cycle of 5-FU/irinotecan  12/03/2023.  She denies nausea/vomiting.  No mouth sores.  Single loose stool which she feels was diet related.  No hand or foot pain or redness.  Objective:  Vital signs in last 24 hours:  Blood pressure 115/77, pulse 71, temperature 97.9 F (36.6 C), temperature source Temporal, resp. rate 16, height 5' 8 (1.727 m), weight 184 lb 14.4 oz (83.9 kg), last menstrual period 09/17/2012, SpO2 100%.    HEENT: No thrush or ulcers.  Scattered areas of hyperpigmentation tongue and buccal mucosa. Resp: Lungs clear bilaterally. Cardio: Regular rate and rhythm. GI: No hepatosplenomegaly.  Nontender. Vascular: No leg edema. Skin: No rash. Port-A-Cath without erythema.  Lab Results:  Lab Results  Component Value Date   WBC 3.1 (L) 12/24/2023   HGB 10.1 (L) 12/24/2023   HCT 31.9 (L) 12/24/2023   MCV 103.9 (H) 12/24/2023   PLT 283 12/24/2023   NEUTROABS 1.3 (L) 12/24/2023    Imaging:  No results found.  Medications: I have reviewed the patient's current medications.  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 Tumor invades the duodenal wall and vascular groove, resection margins negative, 4/34 lymph nodes positive MSI-stable, tumor showed instability in 2 loci as did adjacent normal tissue Foundation 1-K-ras G12 V, microsatellite status and tumor mutation burden cannot be determined EUS FNA biopsy of pancreas mass on 07/03/2018-well-differentiated neuroendocrine tumor CTs 01/29/2019-ill-defined pancreas head mass, 5 pulmonary nodules-1 with a small amount of central cavitation, tumor abuts the left margin of the portal vein indistinct density surrounding, hepatic artery, complex cystic lesion of the right kidney, right adrenal mass-characterized as an  adenoma on a [facility] MRI 12/21/2018 [facility]  03/03/2019-no focal pancreas activity, no tracer accumulation within the suspicious pulmonary nodules, left uterine mass with tracer accumulation felt to represent a leiomyoma Elevated preoperative CA 19-9--CA 19-9 186 on 01/18/2019 CT chest 04/16/2019-multiple bilateral pulmonary nodules, some with increased cavitation, stable in size Cycle 1 FOLFIRINOX 04/27/2019 Cycle 2 FOLFIRINOX 05/11/2019 Cycle 3 FOLFIRINOX 05/23/2019 Cycle 4 FOLFIRINOX 06/08/2019 Cycle 5 FOLFIRINOX 06/22/2019 CT chest 07/02/2019-stable size of bilateral pulmonary nodules.  Dominant cavitary lesions in both lungs show increased cavitation with thinner walls.  Stable 2.1 cm right adrenal nodule. Cycle 6 FOLFIRINOX 07/06/2019 Cycle 7 FOLFIRINOX 07/21/2019 Cycle 8 FOLFIRINOX 08/03/2019, oxaliplatin  deleted secondary to neuropathy CT chest 08/24/2019-decreased size of several lung nodules with resolution of a left upper lobe nodule, no new nodules Radiation to the pancreas surgical area with concurrent Xeloda  09/13/2019-10/20/2019  CTs 11/29/2019-multiple small pulmonary nodules scattered throughout the lungs bilaterally, appear increased in number and size. No definite evidence of metastatic disease in the abdomen or pelvis. Markedly enlarged and heterogeneous appearing uterus, likely to represent multifocal fibroids. 1 of these lesions appears to be an exophytic subserosal fibroid in the posterior lateral aspect of the uterine body on the left side although this comes in close proximity to the left adnexa such that a primary ovarian lesion is difficult to completely exclude. CTs 02/07/2020-slight enlargement of bilateral lung nodules, some are cavitary, no evidence of metastatic disease in the abdomen or pelvis, stable right adrenal nodule, uterine fibroids CTs 04/26/2020-mild enlargement of pulmonary nodules, slight increase in trace pelvic fluid, new soft tissue thickening inferior to the cecal tip  suspicious for peritoneal metastasis CT 05/26/2020-improved appearance of soft tissue at the inferior tip of the cecum,  mildly thickened short appendix-findings suggestive of resolving appendicitis, stable small bibasilar pulmonary nodules, fibroids Plan biopsy of right cecal tip soft tissue canceled secondary to radiologic improvement CT chest 08/02/2020-enlargement and progressive cavitation of multiple bilateral lung nodules.  Some new nodules are present. CTs 10/24/2020- increase in size of pulmonary nodules, no new nodules, no evidence of metastatic disease in the abdomen, stable right adrenal nodule CT 01/09/2021-slight interval enlargement of pulmonary nodules, stable right adrenal nodule Navigation bronchoscopy 01/30/2021-left lower lobe cavitary nodule FNA-adenocarcinoma, brushing-adenocarcinoma.  Left lower lobe lavage-adenocarcinoma.  Right upper lobe brushing and FNA biopsy of cavitary nodule-adenocarcinoma-immunohistochemical profile consistent with pancreas adenocarcinoma Cycle 1 gemcitabine /Abraxane  03/28/2021 Cycle 2 gemcitabine /Abraxane  04/11/2021 Cycle 3 gemcitabine /Abraxane  04/25/2021 Cycle 4 gemcitabine /Abraxane  05/09/2021 Cycle 5 gemcitabine /Abraxane  05/23/2021 CT chest 06/05/2021-interval cavitation of multiple pulmonary nodules, some nodules have decreased in size, no new or enlarging nodules Cycle 6 gemcitabine /Abraxane  06/06/2021 Cycle 7 gemcitabine /Abraxane  06/21/2021 Cycle 8 gemcitabine /Abraxane  07/05/2021 Cycle 9 Gemcitabine /Abraxane  07/19/2021 Cycle 10 gemcitabine  08/01/2021-Abraxane  held secondary to neuropathy CT chest 08/13/2021-mild decrease in size and wall thickness of multiple cavitary nodules, no new or progressive disease in the chest, indeterminate low-attenuation right liver lesions Cycle 11 gemcitabine  08/16/2021-Abraxane  held secondary to neuropathy Cycle 12 gemcitabine  08/30/2021-Abraxane  held secondary to neuropathy Cycle 13 gemcitabine  09/13/2021-Abraxane  held  secondary to neuropathy Cycle 14 gemcitabine  09/27/2021-Abraxane  held secondary to neuropathy Cycle 15 gemcitabine  10/11/2021-Abraxane  held secondary to neuropathy CTs 10/22/2021-no change in multiple cavitary lung nodules, no evidence of disease progression, ill-defined hypodense lesion in the posterior right liver suspicious for metastatic disease Cycle 16 gemcitabine  10/25/2021 Cycle 17 gemcitabine  11/08/2021 Cycle 18 Gemcitabine  11/22/2021 Cycle 19 gemcitabine  12/06/2021 Cycle 20 Gemcitabine  12/20/2021 CT 12/31/2021-mild increase in size of bilateral pulmonary metastases, stable subtle continuation right liver lesions Cycle 20 gemcitabine /Abraxane  01/03/2022 Cycle 21 gemcitabine /Abraxane  01/17/2022 Cycle 22 gemcitabine /Abraxane  01/31/2022 Cycle 23 gemcitabine /Abraxane  02/14/2022 Cycle 24 gemcitabine /Abraxane  02/28/2022 CTs 03/11/2022-widespread metastatic disease to the lungs again noted with slight involution of several of the pulmonary nodules, no definite new nodules noted; interval cavitation of solid lesion in the posterior aspect right lobe of the liver, no new liver lesions noted. Cycle 25 Gemcitabine /Abraxane  03/14/2022 Cycle 26 gemcitabine /Abraxane  03/28/2022 Cycle 27 gemcitabine /Abraxane  04/25/2022 Cycle 28 gemcitabine /Abraxane  05/09/2022 Cycle 29 Gemcitabine  05/23/2022, Abraxane  held due to neuropathy CTs 06/04/2022-stable multifocal cavitary pulmonary nodules.  No definite new nodules.  No new nodules greater than a centimeter.  Decreased size of the right posterior hepatic lobe lesion.  Generalized heterogeneity and new focal areas of hypodensity in the liver. Cycle 30 Gemcitabine  06/06/2022, Abraxane  held due to neuropathy Cycle 31 gemcitabine  06/20/2022, Abraxane  held due to neuropathy Cycle 32 gemcitabine  07/04/2022, Abraxane  held due to neuropathy Cycle 33 gemcitabine   07/18/2022 ,Abraxane  held due to neuropathy Cycle 34 gemcitabine  08/15/2022, Abraxane  held due to neuropathy Cycle 35  gemcitabine  08/29/2022, Abraxane  held due to neuropathy CTs 09/10/2022-enlarging pulmonary metastases.  Subtle poorly defined hypoattenuating lesions in the liver, less well-seen than on 06/04/2022.  Suspected new lesion in the dome of the right hepatic lobe Cycle 36 gemcitabine /Abraxane  09/12/2022 Cycle 37 gemcitabine /Abraxane  09/26/2022 Cycle 38 gemcitabine /Abraxane  10/10/2022 Cycle 39 gemcitabine /Abraxane  11/07/2022 Cycle 40 gemcitabine /Abraxane  11/21/2022 Cycle 41 gemcitabine /Abraxane  12/05/2022 Cycle 42 gemcitabine /Abraxane  12/19/2022 CT is 12/30/2022-new left hepatic lesion, stable bilateral pulmonary metastases Cycle 43 gemcitabine /Abraxane  01/02/2023 CT-guided biopsy of liver lesion 01/27/2023-acute/chronic inflammation, negative for malignancy, culture negative Cycle 44 gemcitabine /Abraxane  01/30/2023 Cycle 45 gemcitabine /Abraxane  02/13/2023 Cycle 46 gemcitabine /Abraxane  02/27/2023 Cycle 47 gemcitabine /Abraxane  03/13/2023 Cycle 48 gemcitabine /Abraxane  03/27/2023 CTs 04/21/2023-numerous solid and cavitary lung nodules overall similar, some slightly enlarged; multiple new  and enlarging rim-enhancing liver lesions. Cycle 1 FOLFIRI with liposomal irinotecan  05/21/2023 Cycle 2 held 06/05/2023 due to neutropenia Cycle 2 FOLFIRI with liposomal irinotecan  06/18/2023, irinotecan  dose reduced (white cell growth factor declined per patient) Cycle 3 FOLFIRI with liposomal irinotecan  07/02/2023 Cycle 4 FOLFIRI with liposomal irinotecan  07/16/2023 Cycle 5 FOLFIRI with liposomal irinotecan  07/30/2023 Cycle 6 FOLFIRI with liposomal irinotecan  08/13/2023 CTs 08/20/2023-numerous cavitary lesions seen in the lungs, extent and distribution appear similar.  A few lesions are slightly smaller.  Numerous liver lesions are now smaller.  Stable right adrenal nodule, adenoma favored. Cycle 7 FOLFIRI with liposomal irinotecan  08/27/2023 Cycle 8 FOLFIRI with liposomal irinotecan  09/11/2023 Cycle 9 FOLFIRI with liposomal irinotecan   09/24/2023 Cycle 10 FOLFIRI with liposomal irinotecan  10/08/2023 Cycle 11 FOLFIRI with liposomal irinotecan  10/22/2023 Cycle 12 FOLFIRI with liposomal irinotecan  11/05/2023 CT 11/17/2023-majority of multiple irregular thick-walled cavitary lesions throughout bilateral lungs grossly unchanged or slightly decreased in size.  Redemonstration of peripheral wedge-shaped ill-defined hypoattenuating areas in the liver suggesting treatment response.  No discrete new suspicious liver lesions seen.  No new metastatic disease. 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  Apixaban  10/24/2022 Port-A-Cath malfunction 08/01/2023.  Port-A-Cath removed and replaced 08/08/2023.  Disposition: Ms. Coppess appears stable.  She has completed 14 cycles of FOLFIRI with liposomal irinotecan .  She continues to tolerate treatment well.  There is no clinical evidence of disease progression.  Most recent CA 19-9 further improved.  Plan to proceed with cycle 15 today as scheduled.  CBC and chemistry panel  reviewed.  Labs adequate for treatment.  She has mild neutropenia.  She understands to contact the office with fever, chills, other signs of infection.  She will return for follow-up and treatment in 3 weeks.  We are available to see her sooner if needed.    Olam Ned ANP/GNP-BC   12/24/2023  9:02 AM

## 2023-12-24 NOTE — Patient Instructions (Signed)
 [facility] - A DEPT OF [facility]  Discharge Instructions: Thank you for choosing [facility] to provide your oncology and hematology care.   If you have a lab appointment with the Cancer Center, please go directly to the Cancer Center and check in at the registration area.   Wear comfortable clothing and clothing appropriate for easy access to any Portacath or PICC line.   We strive to give you quality time with your provider. You may need to reschedule your appointment if you arrive late (15 or more minutes).  Arriving late affects you and other patients whose appointments are after yours.  Also, if you miss three or more appointments without notifying the office, you may be dismissed from the clinic at the provider's discretion.      For prescription refill requests, have your pharmacy contact our office and allow 72 hours for refills to be completed.    Today you received the following chemotherapy and/or immunotherapy agents: irinotecan , leucovorin , fluorouracil .  Fluorouracil  Injection What is this medication? FLUOROURACIL  (flure oh YOOR a sil) treats some types of cancer. It works by slowing down the growth of cancer cells. This medicine may be used for other purposes; ask your health care provider or pharmacist if you have questions. COMMON BRAND NAME(S): Adrucil  What should I tell my care team before I take this medication? They need to know if you have any of these conditions: Blood disorders Dihydropyrimidine dehydrogenase (DPD) deficiency Infection, such as chickenpox, cold sores, herpes Kidney disease Liver disease Poor nutrition Recent or ongoing radiation therapy An unusual or allergic reaction to fluorouracil , other medications, foods, dyes, or preservatives If you or your partner are pregnant or trying to get pregnant Breast-feeding How should I use this medication? This medication is injected into a vein. It is  administered by your care team in a hospital or clinic setting. Talk to your care team about the use of this medication in children. Special care may be needed. Overdosage: If you think you have taken too much of this medicine contact a poison control center or emergency room at once. NOTE: This medicine is only for you. Do not share this medicine with others. What if I miss a dose? Keep appointments for follow-up doses. It is important not to miss your dose. Call your care team if you are unable to keep an appointment. What may interact with this medication? Do not take this medication with any of the following: Live virus vaccines This medication may also interact with the following: Medications that treat or prevent blood clots, such as warfarin, enoxaparin , dalteparin This list may not describe all possible interactions. Give your health care provider a list of all the medicines, herbs, non-prescription drugs, or dietary supplements you use. Also tell them if you smoke, drink alcohol, or use illegal drugs. Some items may interact with your medicine. What should I watch for while using this medication? Your condition will be monitored carefully while you are receiving this medication. This medication may make you feel generally unwell. This is not uncommon as chemotherapy can affect healthy cells as well as cancer cells. Report any side effects. Continue your course of treatment even though you feel ill unless your care team tells you to stop. In some cases, you may be given additional medications to help with side effects. Follow all directions for their use. This medication may increase your risk of getting an infection. Call your care team for advice  if you get a fever, chills, sore throat, or other symptoms of a cold or flu. Do not treat yourself. Try to avoid being around people who are sick. This medication may increase your risk to bruise or bleed. Call your care team if you notice any  unusual bleeding. Be careful brushing or flossing your teeth or using a toothpick because you may get an infection or bleed more easily. If you have any dental work done, tell your dentist you are receiving this medication. Avoid taking medications that contain aspirin, acetaminophen , ibuprofen, naproxen, or ketoprofen unless instructed by your care team. These medications may hide a fever. Do not treat diarrhea with over the counter products. Contact your care team if you have diarrhea that lasts more than 2 days or if it is severe and watery. This medication can make you more sensitive to the sun. Keep out of the sun. If you cannot avoid being in the sun, wear protective clothing and sunscreen. Do not use sun lamps, tanning beds, or tanning booths. Talk to your care team if you or your partner wish to become pregnant or think you might be pregnant. This medication can cause serious birth defects if taken during pregnancy and for 3 months after the last dose. A reliable form of contraception is recommended while taking this medication and for 3 months after the last dose. Talk to your care team about effective forms of contraception. Do not father a child while taking this medication and for 3 months after the last dose. Use a condom while having sex during this time period. Do not breastfeed while taking this medication. This medication may cause infertility. Talk to your care team if you are concerned about your fertility. What side effects may I notice from receiving this medication? Side effects that you should report to your care team as soon as possible: Allergic reactions--skin rash, itching, hives, swelling of the face, lips, tongue, or throat Heart attack--pain or tightness in the chest, shoulders, arms, or jaw, nausea, shortness of breath, cold or clammy skin, feeling faint or lightheaded Heart failure--shortness of breath, swelling of the ankles, feet, or hands, sudden weight gain, unusual  weakness or fatigue Heart rhythm changes--fast or irregular heartbeat, dizziness, feeling faint or lightheaded, chest pain, trouble breathing High ammonia level--unusual weakness or fatigue, confusion, loss of appetite, nausea, vomiting, seizures Infection--fever, chills, cough, sore throat, wounds that don't heal, pain or trouble when passing urine, general feeling of discomfort or being unwell Low red blood cell level--unusual weakness or fatigue, dizziness, headache, trouble breathing Pain, tingling, or numbness in the hands or feet, muscle weakness, change in vision, confusion or trouble speaking, loss of balance or coordination, trouble walking, seizures Redness, swelling, and blistering of the skin over hands and feet Severe or prolonged diarrhea Unusual bruising or bleeding Side effects that usually do not require medical attention (report to your care team if they continue or are bothersome): Dry skin Headache Increased tears Nausea Pain, redness, or swelling with sores inside the mouth or throat Sensitivity to light Vomiting This list may not describe all possible side effects. Call your doctor for medical advice about side effects. You may report side effects to FDA at 1-800-FDA-1088. Where should I keep my medication? This medication is given in a hospital or clinic. It will not be stored at home. NOTE: This sheet is a summary. It may not cover all possible information. If you have questions about this medicine, talk to your doctor, pharmacist, or health care  provider.  2024 Elsevier/Gold Standard (2021-10-02 00:00:00)  Leucovorin  Injection What is this medication? LEUCOVORIN  (loo koe VOR in) prevents side effects from certain medications, such as methotrexate. It works by increasing folate levels. This helps protect healthy cells in your body. It may also be used to treat anemia caused by low levels of folate. It can also be used with fluorouracil , a type of chemotherapy, to  treat colorectal cancer. It works by increasing the effects of fluorouracil  in the body. This medicine may be used for other purposes; ask your health care provider or pharmacist if you have questions. What should I tell my care team before I take this medication? They need to know if you have any of these conditions: Anemia from low levels of vitamin B12 in the blood An unusual or allergic reaction to leucovorin , folic acid, other medications, foods, dyes, or preservatives Pregnant or trying to get pregnant Breastfeeding How should I use this medication? This medication is injected into a vein or a muscle. It is given by your care team in a hospital or clinic setting. Talk to your care team about the use of this medication in children. Special care may be needed. Overdosage: If you think you have taken too much of this medicine contact a poison control center or emergency room at once. NOTE: This medicine is only for you. Do not share this medicine with others. What if I miss a dose? Keep appointments for follow-up doses. It is important not to miss your dose. Call your care team if you are unable to keep an appointment. What may interact with this medication? Capecitabine  Fluorouracil  Phenobarbital Phenytoin Primidone Trimethoprim;sulfamethoxazole This list may not describe all possible interactions. Give your health care provider a list of all the medicines, herbs, non-prescription drugs, or dietary supplements you use. Also tell them if you smoke, drink alcohol, or use illegal drugs. Some items may interact with your medicine. What should I watch for while using this medication? Your condition will be monitored carefully while you are receiving this medication. This medication may increase the side effects of 5-fluorouracil . Tell your care team if you have diarrhea or mouth sores that do not get better or that get worse. What side effects may I notice from receiving this  medication? Side effects that you should report to your care team as soon as possible: Allergic reactions--skin rash, itching, hives, swelling of the face, lips, tongue, or throat This list may not describe all possible side effects. Call your doctor for medical advice about side effects. You may report side effects to FDA at 1-800-FDA-1088. Where should I keep my medication? This medication is given in a hospital or clinic. It will not be stored at home. NOTE: This sheet is a summary. It may not cover all possible information. If you have questions about this medicine, talk to your doctor, pharmacist, or health care provider.  2024 Elsevier/Gold Standard (2021-10-30 00:00:00)  Irinotecan  Injection What is this medication? IRINOTECAN  (ir in oh TEE kan) treats some types of cancer. It works by slowing down the growth of cancer cells. This medicine may be used for other purposes; ask your health care provider or pharmacist if you have questions. COMMON BRAND NAME(S): Camptosar  What should I tell my care team before I take this medication? They need to know if you have any of these conditions: Dehydration Diarrhea Infection, especially a viral infection, such as chickenpox, cold sores, herpes Liver disease Low blood cell levels (white cells, red cells, and platelets)  Low levels of electrolytes, such as calcium , magnesium , or potassium in your blood Recent or ongoing radiation An unusual or allergic reaction to irinotecan , other medications, foods, dyes, or preservatives If you or your partner are pregnant or trying to get pregnant Breast-feeding How should I use this medication? This medication is injected into a vein. It is given by your care team in a hospital or clinic setting. Talk to your care team about the use of this medication in children. Special care may be needed. Overdosage: If you think you have taken too much of this medicine contact a poison control center or emergency room  at once. NOTE: This medicine is only for you. Do not share this medicine with others. What if I miss a dose? Keep appointments for follow-up doses. It is important not to miss your dose. Call your care team if you are unable to keep an appointment. What may interact with this medication? Do not take this medication with any of the following: Cobicistat Itraconazole This medication may also interact with the following: Certain antibiotics, such as clarithromycin, rifampin, rifabutin Certain antivirals for HIV or AIDS Certain medications for fungal infections, such as ketoconazole, posaconazole, voriconazole Certain medications for seizures, such as carbamazepine, phenobarbital, phenytoin Gemfibrozil Nefazodone St. John's wort This list may not describe all possible interactions. Give your health care provider a list of all the medicines, herbs, non-prescription drugs, or dietary supplements you use. Also tell them if you smoke, drink alcohol, or use illegal drugs. Some items may interact with your medicine. What should I watch for while using this medication? Your condition will be monitored carefully while you are receiving this medication. You may need blood work while taking this medication. This medication may make you feel generally unwell. This is not uncommon as chemotherapy can affect healthy cells as well as cancer cells. Report any side effects. Continue your course of treatment even though you feel ill unless your care team tells you to stop. This medication can cause serious side effects. To reduce the risk, your care team may give you other medications to take before receiving this one. Be sure to follow the directions from your care team. This medication may affect your coordination, reaction time, or judgement. Do not drive or operate machinery until you know how this medication affects you. Sit up or stand slowly to reduce the risk of dizzy or fainting spells. Drinking alcohol  with this medication can increase the risk of these side effects. This medication may increase your risk of getting an infection. Call your care team for advice if you get a fever, chills, sore throat, or other symptoms of a cold or flu. Do not treat yourself. Try to avoid being around people who are sick. Avoid taking medications that contain aspirin, acetaminophen , ibuprofen, naproxen, or ketoprofen unless instructed by your care team. These medications may hide a fever. This medication may increase your risk to bruise or bleed. Call your care team if you notice any unusual bleeding. Be careful brushing or flossing your teeth or using a toothpick because you may get an infection or bleed more easily. If you have any dental work done, tell your dentist you are receiving this medication. Talk to your care team if you or your partner are pregnant or think either of you might be pregnant. This medication can cause serious birth defects if taken during pregnancy and for 6 months after the last dose. You will need a negative pregnancy test before starting this medication.  Contraception is recommended while taking this medication and for 6 months after the last dose. Your care team can help you find the option that works for you. Do not father a child while taking this medication and for 3 months after the last dose. Use a condom for contraception during this time period. Do not breastfeed while taking this medication and for 7 days after the last dose. This medication may cause infertility. Talk to your care team if you are concerned about your fertility. What side effects may I notice from receiving this medication? Side effects that you should report to your care team as soon as possible: Allergic reactions--skin rash, itching, hives, swelling of the face, lips, tongue, or throat Dry cough, shortness of breath or trouble breathing Increased saliva or tears, increased sweating, stomach cramping, diarrhea,  small pupils, unusual weakness or fatigue, slow heartbeat Infection--fever, chills, cough, sore throat, wounds that don't heal, pain or trouble when passing urine, general feeling of discomfort or being unwell Kidney injury--decrease in the amount of urine, swelling of the ankles, hands, or feet Low red blood cell level--unusual weakness or fatigue, dizziness, headache, trouble breathing Severe or prolonged diarrhea Unusual bruising or bleeding Side effects that usually do not require medical attention (report to your care team if they continue or are bothersome): Constipation Diarrhea Hair loss Loss of appetite Nausea Stomach pain This list may not describe all possible side effects. Call your doctor for medical advice about side effects. You may report side effects to FDA at 1-800-FDA-1088. Where should I keep my medication? This medication is given in a hospital or clinic. It will not be stored at home. NOTE: This sheet is a summary. It may not cover all possible information. If you have questions about this medicine, talk to your doctor, pharmacist, or health care provider.  2024 Elsevier/Gold Standard (2021-10-08 00:00:00)      To help prevent nausea and vomiting after your treatment, we encourage you to take your nausea medication as directed.  BELOW ARE SYMPTOMS THAT SHOULD BE REPORTED IMMEDIATELY: *FEVER GREATER THAN 100.4 F (38 C) OR HIGHER *CHILLS OR SWEATING *NAUSEA AND VOMITING THAT IS NOT CONTROLLED WITH YOUR NAUSEA MEDICATION *UNUSUAL SHORTNESS OF BREATH *UNUSUAL BRUISING OR BLEEDING *URINARY PROBLEMS (pain or burning when urinating, or frequent urination) *BOWEL PROBLEMS (unusual diarrhea, constipation, pain near the anus) TENDERNESS IN MOUTH AND THROAT WITH OR WITHOUT PRESENCE OF ULCERS (sore throat, sores in mouth, or a toothache) UNUSUAL RASH, SWELLING OR PAIN  UNUSUAL VAGINAL DISCHARGE OR ITCHING   Items with * indicate a potential emergency and should be  followed up as soon as possible or go to the Emergency Department if any problems should occur.  Please show the CHEMOTHERAPY ALERT CARD or IMMUNOTHERAPY ALERT CARD at check-in to the Emergency Department and triage nurse.  Should you have questions after your visit or need to cancel or reschedule your appointment, please contact [facility] - A DEPT OF [facility]  Dept: [phone]  and follow the prompts.  Office hours are 8:00 a.m. to 4:30 p.m. Monday - Friday. Please note that voicemails left after 4:00 p.m. may not be returned until the following business day.  We are closed weekends and major holidays. You have access to a nurse at all times for urgent questions. Please call the main number to the clinic Dept: [phone] and follow the prompts.   For any non-urgent questions, you may also contact your provider using MyChart. We now  offer e-Visits for anyone 47 and older to request care online for non-urgent symptoms. For details visit [email or web address].   Also download the MyChart app! Go to the app store, search MyChart, open the app, select [facility], and log in with your MyChart username and password.

## 2023-12-25 LAB — CANCER ANTIGEN 19-9: CA 19-9: 100 U/mL — ABNORMAL HIGH (ref 0–35)

## 2023-12-26 ENCOUNTER — Inpatient Hospital Stay

## 2023-12-26 VITALS — BP 116/79 | HR 84 | Temp 98.1°F | Resp 18

## 2023-12-26 DIAGNOSIS — Z5111 Encounter for antineoplastic chemotherapy: Secondary | ICD-10-CM | POA: Diagnosis not present

## 2023-12-26 DIAGNOSIS — C250 Malignant neoplasm of head of pancreas: Secondary | ICD-10-CM

## 2023-12-26 DIAGNOSIS — E1140 Type 2 diabetes mellitus with diabetic neuropathy, unspecified: Secondary | ICD-10-CM | POA: Diagnosis not present

## 2023-12-26 DIAGNOSIS — Z905 Acquired absence of kidney: Secondary | ICD-10-CM | POA: Diagnosis not present

## 2023-12-26 DIAGNOSIS — Z85528 Personal history of other malignant neoplasm of kidney: Secondary | ICD-10-CM | POA: Diagnosis not present

## 2023-12-26 DIAGNOSIS — C3411 Malignant neoplasm of upper lobe, right bronchus or lung: Secondary | ICD-10-CM | POA: Diagnosis not present

## 2023-12-26 DIAGNOSIS — Z79899 Other long term (current) drug therapy: Secondary | ICD-10-CM | POA: Diagnosis not present

## 2023-12-26 DIAGNOSIS — I10 Essential (primary) hypertension: Secondary | ICD-10-CM | POA: Diagnosis not present

## 2023-12-26 MED ORDER — HEPARIN SOD (PORK) LOCK FLUSH 100 UNIT/ML IV SOLN
500.0000 [IU] | Freq: Once | INTRAVENOUS | Status: AC | PRN
  Administered 2023-12-26: 500 [IU]

## 2023-12-26 MED ORDER — SODIUM CHLORIDE 0.9% FLUSH
10.0000 mL | INTRAVENOUS | Status: DC | PRN
  Administered 2023-12-26: 10 mL

## 2023-12-26 NOTE — Patient Instructions (Signed)

## 2023-12-30 ENCOUNTER — Ambulatory Visit: Payer: BC Managed Care – PPO | Admitting: Oncology

## 2023-12-30 ENCOUNTER — Other Ambulatory Visit: Payer: BC Managed Care – PPO

## 2023-12-30 ENCOUNTER — Ambulatory Visit: Payer: BC Managed Care – PPO

## 2023-12-31 ENCOUNTER — Encounter: Payer: Self-pay | Admitting: Oncology

## 2023-12-31 NOTE — Telephone Encounter (Signed)
 Spoke with patient confirming upcoming appointment changes

## 2024-01-04 NOTE — Assessment & Plan Note (Addendum)
 Chronic, well controlled. EKG performed, NSR w/o acute changes. Blood pressure well-managed on valsartan  160 mg, taking half a pill as needed. Edema likely due to dietary sodium intake, managed with spironolactone  as needed. - Continue valsartan  160 mg, taking half a pill as needed. - Continue spironolactone  as needed for edema.

## 2024-01-04 NOTE — Assessment & Plan Note (Signed)
 Chronic, LDL goal is less than 70.  LDL cholesterol at goal on rosuvastatin . - Continue rosuvastatin  10 mg three times a week.

## 2024-01-04 NOTE — Assessment & Plan Note (Signed)
 Chronic, diabetic foot exam was performed.  She will meet with [facility] pharmacist after our visit. [facility] Endo input is appreciated. Glucose levels elevated at 256 mg/dL. On Tresiba  and Humalog  with sliding scale. Discussed insulin  dosage adjustment due to high glucose levels. Uses 1:7 insulin -to-carb ratio with inconsistent carb counting. - Continue Tresiba  17 units when not on steroids and 25 units when on steroids. - Continue Humalog  with sliding scale based on carbohydrate intake. - I DISCUSSED WITH THE PATIENT AT LENGTH REGARDING THE GOALS OF GLYCEMIC CONTROL AND POSSIBLE LONG-TERM COMPLICATIONS.  I  ALSO STRESSED THE IMPORTANCE OF COMPLIANCE WITH HOME GLUCOSE MONITORING, DIETARY RESTRICTIONS INCLUDING AVOIDANCE OF SUGARY DRINKS/PROCESSED FOODS,  ALONG WITH REGULAR EXERCISE.  I  ALSO STRESSED THE IMPORTANCE OF ANNUAL EYE EXAMS, SELF FOOT CARE AND COMPLIANCE WITH OFFICE VISITS.

## 2024-01-04 NOTE — Assessment & Plan Note (Signed)
 Oncology input is appreciated. Treatment plan as per Oncology: PANCREAS Liposomal Irinotecan + Leucovorin + 5-FU IVCI q14d.

## 2024-01-05 ENCOUNTER — Telehealth: Payer: Self-pay | Admitting: Oncology

## 2024-01-05 NOTE — Telephone Encounter (Signed)
 Called PT to get 8/6 txt appointment rescheduled, let PT know to please call me back.

## 2024-01-06 ENCOUNTER — Telehealth: Payer: Self-pay | Admitting: Pharmacist

## 2024-01-06 DIAGNOSIS — E1165 Type 2 diabetes mellitus with hyperglycemia: Secondary | ICD-10-CM

## 2024-01-06 NOTE — Progress Notes (Signed)
   01/06/2024  Patient ID: Alyssa Ross, female   DOB: Dec 21, 1965, 58 y.o.   MRN: [id number]  Called Patient to follow up on blood sugars.  Unfortunately, she did not answer the phone. HIPAA compliant message was left on her voicemail with a request for call back.  Patient's blood glucose values were reviewed via the [employer] app:     Patient committed to counting carb's more consistently at our last visit and was hesitant about increasing her insulin  dose.  Unfortunately, her blood glucose control has not improved.  Her insulin  dose will need to increase. Since the Patient is hesitant, increasing the Tresiba  dose will be the first step.  Plan: Call Patient back in 1-3 business days.   Cassius DOROTHA Brought, PharmD, BCACP Clinical Pharmacist [phone]

## 2024-01-09 ENCOUNTER — Other Ambulatory Visit: Payer: Self-pay | Admitting: Oncology

## 2024-01-09 DIAGNOSIS — C250 Malignant neoplasm of head of pancreas: Secondary | ICD-10-CM

## 2024-01-12 ENCOUNTER — Telehealth: Payer: Self-pay | Admitting: Pharmacist

## 2024-01-12 DIAGNOSIS — E1165 Type 2 diabetes mellitus with hyperglycemia: Secondary | ICD-10-CM

## 2024-01-12 NOTE — Progress Notes (Signed)
   01/12/2024  Patient ID: Alyssa Ross, female   DOB: [birth year], 58 y.o.   MRN: [id number]   Patient was called to follow up on blood sugars. Unfortunately, she did not answer her phone. HIPAA compliant message was left on her phone.  Her blood sugars were accessed via Freestyle Lib[employer]:      GMI-10.1 % Insulin  therapy needs to be increased.  Patient was hesitant.  Plan: Call Patient back in 1 week. She has an upcoming appointment with Dr. Allean Gearing with [facility].   Cassius DOROTHA Brought, PharmD, BCACP Clinical Pharmacist [phone]

## 2024-01-13 ENCOUNTER — Telehealth: Payer: Self-pay | Admitting: Pharmacist

## 2024-01-13 DIAGNOSIS — E1165 Type 2 diabetes mellitus with hyperglycemia: Secondary | ICD-10-CM

## 2024-01-13 NOTE — Progress Notes (Signed)
   01/13/2024  Patient ID: Alyssa Ross, female   DOB: Nov 23, 1965, 58 y.o.   MRN: [id number]  Patient was called to follow up on blood sugars.  HIPAA identifiers were obtained.  She has a past medical history includes but is not limited to: pancreatic cancer, hypertension, hyperlipidemia, and type 2 diabetes.  She sees Dr. Josette Sally with [facility] who is an Endocrinologist and Dr. Cloretta for Oncology.   A very long discussion was had with the Patient about the need to increase her insulin  dose.  She was against this decision and will follow up with her Endocrinologist on March 09, 2024.  We talked through increasing her protein intake, decreasing her carbohydrate intake, and being consistent with following her meal time insulin  per sliding scale with the correction factor.   Per Libreview report, GMI currently 9.8%     On Rosuvastatin  10 mg filled 11/27/23 SUPD gap closed  Lipid Panel     Component Value Date/Time   CHOL 112 12/03/2023 0815   CHOL 120 12/17/2022 1208   TRIG 64 12/03/2023 0815   HDL 41 12/03/2023 0815   HDL 44 12/17/2022 1208   CHOLHDL 2.7 12/03/2023 0815   VLDL 13 12/03/2023 0815   LDLCALC 58 12/03/2023 0815   LDLCALC 62 12/17/2022 1208   LDLDIRECT 97.7 02/11/2014 1034   LABVLDL 14 12/17/2022 1208       Plan: Since Patient was against increasing her insulin  dose, I will follow up with her after her Endocrinologist's appointment.   Cassius DOROTHA Brought, PharmD, BCACP Clinical Pharmacist [phone]

## 2024-01-14 ENCOUNTER — Inpatient Hospital Stay

## 2024-01-14 ENCOUNTER — Inpatient Hospital Stay: Attending: Genetic Counselor | Admitting: Oncology

## 2024-01-14 VITALS — BP 143/80 | HR 82 | Temp 97.8°F | Resp 18 | Ht 68.0 in | Wt 190.7 lb

## 2024-01-14 DIAGNOSIS — Z905 Acquired absence of kidney: Secondary | ICD-10-CM | POA: Insufficient documentation

## 2024-01-14 DIAGNOSIS — Z5111 Encounter for antineoplastic chemotherapy: Secondary | ICD-10-CM | POA: Insufficient documentation

## 2024-01-14 DIAGNOSIS — C259 Malignant neoplasm of pancreas, unspecified: Secondary | ICD-10-CM | POA: Diagnosis not present

## 2024-01-14 DIAGNOSIS — C250 Malignant neoplasm of head of pancreas: Secondary | ICD-10-CM

## 2024-01-14 DIAGNOSIS — C3411 Malignant neoplasm of upper lobe, right bronchus or lung: Secondary | ICD-10-CM | POA: Diagnosis not present

## 2024-01-14 DIAGNOSIS — I10 Essential (primary) hypertension: Secondary | ICD-10-CM | POA: Diagnosis not present

## 2024-01-14 DIAGNOSIS — Z86718 Personal history of other venous thrombosis and embolism: Secondary | ICD-10-CM | POA: Diagnosis not present

## 2024-01-14 DIAGNOSIS — N6320 Unspecified lump in the left breast, unspecified quadrant: Secondary | ICD-10-CM | POA: Diagnosis not present

## 2024-01-14 DIAGNOSIS — Z7901 Long term (current) use of anticoagulants: Secondary | ICD-10-CM | POA: Insufficient documentation

## 2024-01-14 DIAGNOSIS — C3432 Malignant neoplasm of lower lobe, left bronchus or lung: Secondary | ICD-10-CM | POA: Diagnosis not present

## 2024-01-14 DIAGNOSIS — Z79899 Other long term (current) drug therapy: Secondary | ICD-10-CM | POA: Diagnosis not present

## 2024-01-14 DIAGNOSIS — E1140 Type 2 diabetes mellitus with diabetic neuropathy, unspecified: Secondary | ICD-10-CM | POA: Diagnosis not present

## 2024-01-14 DIAGNOSIS — C7801 Secondary malignant neoplasm of right lung: Secondary | ICD-10-CM | POA: Insufficient documentation

## 2024-01-14 DIAGNOSIS — Z85528 Personal history of other malignant neoplasm of kidney: Secondary | ICD-10-CM | POA: Diagnosis not present

## 2024-01-14 DIAGNOSIS — C7802 Secondary malignant neoplasm of left lung: Secondary | ICD-10-CM | POA: Diagnosis not present

## 2024-01-14 LAB — CMP (CANCER CENTER ONLY)
ALT: 29 U/L (ref 0–44)
AST: 32 U/L (ref 15–41)
Albumin: 3.3 g/dL — ABNORMAL LOW (ref 3.5–5.0)
Alkaline Phosphatase: 283 U/L — ABNORMAL HIGH (ref 38–126)
Anion gap: 10 (ref 5–15)
BUN: 8 mg/dL (ref 6–20)
CO2: 24 mmol/L (ref 22–32)
Calcium: 8.8 mg/dL — ABNORMAL LOW (ref 8.9–10.3)
Chloride: 111 mmol/L (ref 98–111)
Creatinine: 0.98 mg/dL (ref 0.44–1.00)
GFR, Estimated: >60 mL/min (ref >60)
Glucose, Bld: 143 mg/dL — ABNORMAL HIGH (ref 70–99)
Potassium: 3.7 mmol/L (ref 3.5–5.1)
Sodium: 144 mmol/L (ref 135–145)
Total Bilirubin: 0.4 mg/dL (ref 0.0–1.2)
Total Protein: 6.2 g/dL — ABNORMAL LOW (ref 6.5–8.1)

## 2024-01-14 LAB — CBC WITH DIFFERENTIAL (CANCER CENTER ONLY)
Abs Immature Granulocytes: 0.01 K/uL (ref 0.00–0.07)
Basophils Absolute: 0 K/uL (ref 0.0–0.1)
Basophils Relative: 1 %
Eosinophils Absolute: 0.1 K/uL (ref 0.0–0.5)
Eosinophils Relative: 4 %
HCT: 31.4 % — ABNORMAL LOW (ref 36.0–46.0)
Hemoglobin: 10.1 g/dL — ABNORMAL LOW (ref 12.0–15.0)
Immature Granulocytes: 0 %
Lymphocytes Relative: 26 %
Lymphs Abs: 1 K/uL (ref 0.7–4.0)
MCH: 32.9 pg (ref 26.0–34.0)
MCHC: 32.2 g/dL (ref 30.0–36.0)
MCV: 102.3 fL — ABNORMAL HIGH (ref 80.0–100.0)
Monocytes Absolute: 0.7 K/uL (ref 0.1–1.0)
Monocytes Relative: 19 %
Neutro Abs: 1.8 K/uL (ref 1.7–7.7)
Neutrophils Relative %: 50 %
Platelet Count: 303 K/uL (ref 150–400)
RBC: 3.07 MIL/uL — ABNORMAL LOW (ref 3.87–5.11)
RDW: 13.2 % (ref 11.5–15.5)
WBC Count: 3.6 K/uL — ABNORMAL LOW (ref 4.0–10.5)
nRBC: 0 % (ref 0.0–0.2)

## 2024-01-14 LAB — MAGNESIUM: Magnesium: 1.8 mg/dL (ref 1.7–2.4)

## 2024-01-14 MED ORDER — SODIUM CHLORIDE 0.9 % IV SOLN
INTRAVENOUS | Status: DC
Start: 2024-01-14 — End: 2024-01-14

## 2024-01-14 MED ORDER — ATROPINE SULFATE 1 MG/ML IV SOLN
0.5000 mg | Freq: Once | INTRAVENOUS | Status: AC | PRN
  Administered 2024-01-14: 0.5 mg via INTRAVENOUS
  Filled 2024-01-14: qty 1

## 2024-01-14 MED ORDER — SODIUM CHLORIDE 0.9 % IV SOLN
2400.0000 mg/m2 | INTRAVENOUS | Status: DC
  Administered 2024-01-14: 5000 mg via INTRAVENOUS
  Filled 2024-01-14: qty 100

## 2024-01-14 MED ORDER — DEXAMETHASONE SODIUM PHOSPHATE 10 MG/ML IJ SOLN
10.0000 mg | Freq: Once | INTRAMUSCULAR | Status: AC
  Administered 2024-01-14: 10 mg via INTRAVENOUS
  Filled 2024-01-14: qty 1

## 2024-01-14 MED ORDER — SODIUM CHLORIDE 0.9 % IV SOLN
55.0000 mg/m2 | Freq: Once | INTRAVENOUS | Status: AC
  Administered 2024-01-14: 107.5 mg via INTRAVENOUS
  Filled 2024-01-14: qty 25

## 2024-01-14 MED ORDER — SODIUM CHLORIDE 0.9 % IV SOLN
400.0000 mg/m2 | Freq: Once | INTRAVENOUS | Status: AC
  Administered 2024-01-14: 776 mg via INTRAVENOUS
  Filled 2024-01-14: qty 38.8

## 2024-01-14 MED ORDER — PALONOSETRON HCL INJECTION 0.25 MG/5ML
0.2500 mg | Freq: Once | INTRAVENOUS | Status: AC
  Administered 2024-01-14: 0.25 mg via INTRAVENOUS
  Filled 2024-01-14: qty 5

## 2024-01-14 NOTE — Patient Instructions (Signed)

## 2024-01-14 NOTE — Progress Notes (Signed)
 Patient seen by Dr. Arley Hof today  Vitals are within treatment parameters:Yes OK to proceed w/BP 143/80  Labs are within treatment parameters: Yes   Treatment plan has been signed: Yes   Per physician team, Patient is ready for treatment and there are NO modifications to the treatment plan.

## 2024-01-14 NOTE — Progress Notes (Signed)
 [facility] OFFICE PROGRESS NOTE   Diagnosis: Pancreas cancer  INTERVAL HISTORY:   Alyssa Ross returns as scheduled.  She completed another cycle of 5-FU/liposomal irinotecan  on 12/24/2023.  No nausea, mouth sores, or diarrhea.  She feels well.  She is working.  She has persistent neuropathy symptoms in the hands and feet.  Objective:  Vital signs in last 24 hours:  Blood pressure (!) 143/80, pulse 82, temperature 97.8 F (36.6 C), temperature source Temporal, resp. rate 18, height 5' 8 (1.727 m), weight 190 lb 11.2 oz (86.5 kg), last menstrual period 09/17/2012, SpO2 100%.    HEENT: No thrush or ulcers Resp: Lungs clear bilaterally Cardio: Regular rate and rhythm GI: No hepatosplenomegaly, no mass, nontender Vascular: No leg edema  Skin: Mild hyperpigmentation of the hands  Portacath/PICC-without erythema  Lab Results:  Lab Results  Component Value Date   WBC 3.6 (L) 01/14/2024   HGB 10.1 (L) 01/14/2024   HCT 31.4 (L) 01/14/2024   MCV 102.3 (H) 01/14/2024   PLT 303 01/14/2024   NEUTROABS 1.8 01/14/2024    CMP  Lab Results  Component Value Date   NA 144 01/14/2024   K 3.7 01/14/2024   CL 111 01/14/2024   CO2 24 01/14/2024   GLUCOSE 143 (H) 01/14/2024   BUN 8 01/14/2024   CREATININE 0.98 01/14/2024   CALCIUM  8.8 (L) 01/14/2024   PROT 6.2 (L) 01/14/2024   ALBUMIN  3.3 (L) 01/14/2024   AST 32 01/14/2024   ALT 29 01/14/2024   ALKPHOS 283 (H) 01/14/2024   BILITOT 0.4 01/14/2024   GFRNONAA >60 01/14/2024   GFRAA >60 02/07/2020    Lab Results  Component Value Date   CAN199 100 (H) 12/24/2023    Lab Results  Component Value Date   INR 1.0 01/27/2023   LABPROT 13.6 01/27/2023    Imaging:  No results found.  Medications: I have reviewed the patient's current medications.   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 Tumor invades the duodenal wall and vascular groove, resection  margins negative, 4/34 lymph nodes positive MSI-stable, tumor showed instability in 2 loci as did adjacent normal tissue Foundation 1-K-ras G12 V, microsatellite status and tumor mutation burden cannot be determined EUS FNA biopsy of pancreas mass on 07/03/2018-well-differentiated neuroendocrine tumor CTs 01/29/2019-ill-defined pancreas head mass, 5 pulmonary nodules-1 with a small amount of central cavitation, tumor abuts the left margin of the portal vein indistinct density surrounding, hepatic artery, complex cystic lesion of the right kidney, right adrenal mass-characterized as an adenoma on a [facility] MRI 12/21/2018 [facility]  03/03/2019-no focal pancreas activity, no tracer accumulation within the suspicious pulmonary nodules, left uterine mass with tracer accumulation felt to represent a leiomyoma Elevated preoperative CA 19-9--CA 19-9 186 on 01/18/2019 CT chest 04/16/2019-multiple bilateral pulmonary nodules, some with increased cavitation, stable in size Cycle 1 FOLFIRINOX 04/27/2019 Cycle 2 FOLFIRINOX 05/11/2019 Cycle 3 FOLFIRINOX 05/23/2019 Cycle 4 FOLFIRINOX 06/08/2019 Cycle 5 FOLFIRINOX 06/22/2019 CT chest 07/02/2019-stable size of bilateral pulmonary nodules.  Dominant cavitary lesions in both lungs show increased cavitation with thinner walls.  Stable 2.1 cm right adrenal nodule. Cycle 6 FOLFIRINOX 07/06/2019 Cycle 7 FOLFIRINOX 07/21/2019 Cycle 8 FOLFIRINOX 08/03/2019, oxaliplatin  deleted secondary to neuropathy CT chest 08/24/2019-decreased size of several lung nodules with resolution of a left upper lobe nodule, no new nodules Radiation to the pancreas surgical area with concurrent Xeloda  09/13/2019-10/20/2019  CTs 11/29/2019-multiple small pulmonary nodules scattered throughout the lungs bilaterally, appear increased in number and size. No definite evidence  of metastatic disease in the abdomen or pelvis. Markedly enlarged and heterogeneous appearing uterus, likely to represent multifocal fibroids. 1  of these lesions appears to be an exophytic subserosal fibroid in the posterior lateral aspect of the uterine body on the left side although this comes in close proximity to the left adnexa such that a primary ovarian lesion is difficult to completely exclude. CTs 02/07/2020-slight enlargement of bilateral lung nodules, some are cavitary, no evidence of metastatic disease in the abdomen or pelvis, stable right adrenal nodule, uterine fibroids CTs 04/26/2020-mild enlargement of pulmonary nodules, slight increase in trace pelvic fluid, new soft tissue thickening inferior to the cecal tip suspicious for peritoneal metastasis CT 05/26/2020-improved appearance of soft tissue at the inferior tip of the cecum, mildly thickened short appendix-findings suggestive of resolving appendicitis, stable small bibasilar pulmonary nodules, fibroids Plan biopsy of right cecal tip soft tissue canceled secondary to radiologic improvement CT chest 08/02/2020-enlargement and progressive cavitation of multiple bilateral lung nodules.  Some new nodules are present. CTs 10/24/2020- increase in size of pulmonary nodules, no new nodules, no evidence of metastatic disease in the abdomen, stable right adrenal nodule CT 01/09/2021-slight interval enlargement of pulmonary nodules, stable right adrenal nodule Navigation bronchoscopy 01/30/2021-left lower lobe cavitary nodule FNA-adenocarcinoma, brushing-adenocarcinoma.  Left lower lobe lavage-adenocarcinoma.  Right upper lobe brushing and FNA biopsy of cavitary nodule-adenocarcinoma-immunohistochemical profile consistent with pancreas adenocarcinoma Cycle 1 gemcitabine /Abraxane  03/28/2021 Cycle 2 gemcitabine /Abraxane  04/11/2021 Cycle 3 gemcitabine /Abraxane  04/25/2021 Cycle 4 gemcitabine /Abraxane  05/09/2021 Cycle 5 gemcitabine /Abraxane  05/23/2021 CT chest 06/05/2021-interval cavitation of multiple pulmonary nodules, some nodules have decreased in size, no new or enlarging nodules Cycle 6  gemcitabine /Abraxane  06/06/2021 Cycle 7 gemcitabine /Abraxane  06/21/2021 Cycle 8 gemcitabine /Abraxane  07/05/2021 Cycle 9 Gemcitabine /Abraxane  07/19/2021 Cycle 10 gemcitabine  08/01/2021-Abraxane  held secondary to neuropathy CT chest 08/13/2021-mild decrease in size and wall thickness of multiple cavitary nodules, no new or progressive disease in the chest, indeterminate low-attenuation right liver lesions Cycle 11 gemcitabine  08/16/2021-Abraxane  held secondary to neuropathy Cycle 12 gemcitabine  08/30/2021-Abraxane  held secondary to neuropathy Cycle 13 gemcitabine  09/13/2021-Abraxane  held secondary to neuropathy Cycle 14 gemcitabine  09/27/2021-Abraxane  held secondary to neuropathy Cycle 15 gemcitabine  10/11/2021-Abraxane  held secondary to neuropathy CTs 10/22/2021-no change in multiple cavitary lung nodules, no evidence of disease progression, ill-defined hypodense lesion in the posterior right liver suspicious for metastatic disease Cycle 16 gemcitabine  10/25/2021 Cycle 17 gemcitabine  11/08/2021 Cycle 18 Gemcitabine  11/22/2021 Cycle 19 gemcitabine  12/06/2021 Cycle 20 Gemcitabine  12/20/2021 CT 12/31/2021-mild increase in size of bilateral pulmonary metastases, stable subtle continuation right liver lesions Cycle 20 gemcitabine /Abraxane  01/03/2022 Cycle 21 gemcitabine /Abraxane  01/17/2022 Cycle 22 gemcitabine /Abraxane  01/31/2022 Cycle 23 gemcitabine /Abraxane  02/14/2022 Cycle 24 gemcitabine /Abraxane  02/28/2022 CTs 03/11/2022-widespread metastatic disease to the lungs again noted with slight involution of several of the pulmonary nodules, no definite new nodules noted; interval cavitation of solid lesion in the posterior aspect right lobe of the liver, no new liver lesions noted. Cycle 25 Gemcitabine /Abraxane  03/14/2022 Cycle 26 gemcitabine /Abraxane  03/28/2022 Cycle 27 gemcitabine /Abraxane  04/25/2022 Cycle 28 gemcitabine /Abraxane  05/09/2022 Cycle 29 Gemcitabine  05/23/2022, Abraxane  held due to neuropathy CTs  06/04/2022-stable multifocal cavitary pulmonary nodules.  No definite new nodules.  No new nodules greater than a centimeter.  Decreased size of the right posterior hepatic lobe lesion.  Generalized heterogeneity and new focal areas of hypodensity in the liver. Cycle 30 Gemcitabine  06/06/2022, Abraxane  held due to neuropathy Cycle 31 gemcitabine  06/20/2022, Abraxane  held due to neuropathy Cycle 32 gemcitabine  07/04/2022, Abraxane  held due to neuropathy Cycle 33 gemcitabine   07/18/2022 ,Abraxane  held due to neuropathy Cycle 34 gemcitabine  08/15/2022, Abraxane  held due  to neuropathy Cycle 35 gemcitabine  08/29/2022, Abraxane  held due to neuropathy CTs 09/10/2022-enlarging pulmonary metastases.  Subtle poorly defined hypoattenuating lesions in the liver, less well-seen than on 06/04/2022.  Suspected new lesion in the dome of the right hepatic lobe Cycle 36 gemcitabine /Abraxane  09/12/2022 Cycle 37 gemcitabine /Abraxane  09/26/2022 Cycle 38 gemcitabine /Abraxane  10/10/2022 Cycle 39 gemcitabine /Abraxane  11/07/2022 Cycle 40 gemcitabine /Abraxane  11/21/2022 Cycle 41 gemcitabine /Abraxane  12/05/2022 Cycle 42 gemcitabine /Abraxane  12/19/2022 CT is 12/30/2022-new left hepatic lesion, stable bilateral pulmonary metastases Cycle 43 gemcitabine /Abraxane  01/02/2023 CT-guided biopsy of liver lesion 01/27/2023-acute/chronic inflammation, negative for malignancy, culture negative Cycle 44 gemcitabine /Abraxane  01/30/2023 Cycle 45 gemcitabine /Abraxane  02/13/2023 Cycle 46 gemcitabine /Abraxane  02/27/2023 Cycle 47 gemcitabine /Abraxane  03/13/2023 Cycle 48 gemcitabine /Abraxane  03/27/2023 CTs 04/21/2023-numerous solid and cavitary lung nodules overall similar, some slightly enlarged; multiple new and enlarging rim-enhancing liver lesions. Cycle 1 FOLFIRI with liposomal irinotecan  05/21/2023 Cycle 2 held 06/05/2023 due to neutropenia Cycle 2 FOLFIRI with liposomal irinotecan  06/18/2023, irinotecan  dose reduced (white cell growth factor declined per  patient) Cycle 3 FOLFIRI with liposomal irinotecan  07/02/2023 Cycle 4 FOLFIRI with liposomal irinotecan  07/16/2023 Cycle 5 FOLFIRI with liposomal irinotecan  07/30/2023 Cycle 6 FOLFIRI with liposomal irinotecan  08/13/2023 CTs 08/20/2023-numerous cavitary lesions seen in the lungs, extent and distribution appear similar.  A few lesions are slightly smaller.  Numerous liver lesions are now smaller.  Stable right adrenal nodule, adenoma favored. Cycle 7 FOLFIRI with liposomal irinotecan  08/27/2023 Cycle 8 FOLFIRI with liposomal irinotecan  09/11/2023 Cycle 9 FOLFIRI with liposomal irinotecan  09/24/2023 Cycle 10 FOLFIRI with liposomal irinotecan  10/08/2023 Cycle 11 FOLFIRI with liposomal irinotecan  10/22/2023 Cycle 12 FOLFIRI with liposomal irinotecan  11/05/2023 CT 11/17/2023-majority of multiple irregular thick-walled cavitary lesions throughout bilateral lungs grossly unchanged or slightly decreased in size.  Redemonstration of peripheral wedge-shaped ill-defined hypoattenuating areas in the liver suggesting treatment response.  No discrete new suspicious liver lesions seen.  No new metastatic disease. 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  Apixaban  10/24/2022 Port-A-Cath malfunction 08/01/2023.  Port-A-Cath removed and replaced 08/08/2023.    Disposition: Alyssa Ross appears stable.  She will complete another cycle of 5-FU/liposomal irinotecan  today.  We will follow-up on the CA 19-9 from today.  She will return for an office visit and chemotherapy in 3 weeks.  Arley Hof, MD  01/14/2024  9:48 AM

## 2024-01-14 NOTE — Patient Instructions (Signed)
 [facility] - A DEPT OF [facility]  Discharge Instructions: Thank you for choosing [facility] to provide your oncology and hematology care.   If you have a lab appointment with the Cancer Center, please go directly to the Cancer Center and check in at the registration area.   Wear comfortable clothing and clothing appropriate for easy access to any Portacath or PICC line.   We strive to give you quality time with your provider. You may need to reschedule your appointment if you arrive late (15 or more minutes).  Arriving late affects you and other patients whose appointments are after yours.  Also, if you miss three or more appointments without notifying the office, you may be dismissed from the clinic at the provider's discretion.      For prescription refill requests, have your pharmacy contact our office and allow 72 hours for refills to be completed.    Today you received the following chemotherapy and/or immunotherapy agents: irinotecan , leucovorin , fluorouracil .  Fluorouracil  Injection What is this medication? FLUOROURACIL  (flure oh YOOR a sil) treats some types of cancer. It works by slowing down the growth of cancer cells. This medicine may be used for other purposes; ask your health care provider or pharmacist if you have questions. COMMON BRAND NAME(S): Adrucil  What should I tell my care team before I take this medication? They need to know if you have any of these conditions: Blood disorders Dihydropyrimidine dehydrogenase (DPD) deficiency Infection, such as chickenpox, cold sores, herpes Kidney disease Liver disease Poor nutrition Recent or ongoing radiation therapy An unusual or allergic reaction to fluorouracil , other medications, foods, dyes, or preservatives If you or your partner are pregnant or trying to get pregnant Breast-feeding How should I use this medication? This medication is injected into a vein. It is  administered by your care team in a hospital or clinic setting. Talk to your care team about the use of this medication in children. Special care may be needed. Overdosage: If you think you have taken too much of this medicine contact a poison control center or emergency room at once. NOTE: This medicine is only for you. Do not share this medicine with others. What if I miss a dose? Keep appointments for follow-up doses. It is important not to miss your dose. Call your care team if you are unable to keep an appointment. What may interact with this medication? Do not take this medication with any of the following: Live virus vaccines This medication may also interact with the following: Medications that treat or prevent blood clots, such as warfarin, enoxaparin , dalteparin This list may not describe all possible interactions. Give your health care provider a list of all the medicines, herbs, non-prescription drugs, or dietary supplements you use. Also tell them if you smoke, drink alcohol, or use illegal drugs. Some items may interact with your medicine. What should I watch for while using this medication? Your condition will be monitored carefully while you are receiving this medication. This medication may make you feel generally unwell. This is not uncommon as chemotherapy can affect healthy cells as well as cancer cells. Report any side effects. Continue your course of treatment even though you feel ill unless your care team tells you to stop. In some cases, you may be given additional medications to help with side effects. Follow all directions for their use. This medication may increase your risk of getting an infection. Call your care team for advice  if you get a fever, chills, sore throat, or other symptoms of a cold or flu. Do not treat yourself. Try to avoid being around people who are sick. This medication may increase your risk to bruise or bleed. Call your care team if you notice any  unusual bleeding. Be careful brushing or flossing your teeth or using a toothpick because you may get an infection or bleed more easily. If you have any dental work done, tell your dentist you are receiving this medication. Avoid taking medications that contain aspirin, acetaminophen , ibuprofen, naproxen, or ketoprofen unless instructed by your care team. These medications may hide a fever. Do not treat diarrhea with over the counter products. Contact your care team if you have diarrhea that lasts more than 2 days or if it is severe and watery. This medication can make you more sensitive to the sun. Keep out of the sun. If you cannot avoid being in the sun, wear protective clothing and sunscreen. Do not use sun lamps, tanning beds, or tanning booths. Talk to your care team if you or your partner wish to become pregnant or think you might be pregnant. This medication can cause serious birth defects if taken during pregnancy and for 3 months after the last dose. A reliable form of contraception is recommended while taking this medication and for 3 months after the last dose. Talk to your care team about effective forms of contraception. Do not father a child while taking this medication and for 3 months after the last dose. Use a condom while having sex during this time period. Do not breastfeed while taking this medication. This medication may cause infertility. Talk to your care team if you are concerned about your fertility. What side effects may I notice from receiving this medication? Side effects that you should report to your care team as soon as possible: Allergic reactions--skin rash, itching, hives, swelling of the face, lips, tongue, or throat Heart attack--pain or tightness in the chest, shoulders, arms, or jaw, nausea, shortness of breath, cold or clammy skin, feeling faint or lightheaded Heart failure--shortness of breath, swelling of the ankles, feet, or hands, sudden weight gain, unusual  weakness or fatigue Heart rhythm changes--fast or irregular heartbeat, dizziness, feeling faint or lightheaded, chest pain, trouble breathing High ammonia level--unusual weakness or fatigue, confusion, loss of appetite, nausea, vomiting, seizures Infection--fever, chills, cough, sore throat, wounds that don't heal, pain or trouble when passing urine, general feeling of discomfort or being unwell Low red blood cell level--unusual weakness or fatigue, dizziness, headache, trouble breathing Pain, tingling, or numbness in the hands or feet, muscle weakness, change in vision, confusion or trouble speaking, loss of balance or coordination, trouble walking, seizures Redness, swelling, and blistering of the skin over hands and feet Severe or prolonged diarrhea Unusual bruising or bleeding Side effects that usually do not require medical attention (report to your care team if they continue or are bothersome): Dry skin Headache Increased tears Nausea Pain, redness, or swelling with sores inside the mouth or throat Sensitivity to light Vomiting This list may not describe all possible side effects. Call your doctor for medical advice about side effects. You may report side effects to FDA at 1-800-FDA-1088. Where should I keep my medication? This medication is given in a hospital or clinic. It will not be stored at home. NOTE: This sheet is a summary. It may not cover all possible information. If you have questions about this medicine, talk to your doctor, pharmacist, or health care  provider.  2024 Elsevier/Gold Standard (2021-10-02 00:00:00)  Leucovorin  Injection What is this medication? LEUCOVORIN  (loo koe VOR in) prevents side effects from certain medications, such as methotrexate. It works by increasing folate levels. This helps protect healthy cells in your body. It may also be used to treat anemia caused by low levels of folate. It can also be used with fluorouracil , a type of chemotherapy, to  treat colorectal cancer. It works by increasing the effects of fluorouracil  in the body. This medicine may be used for other purposes; ask your health care provider or pharmacist if you have questions. What should I tell my care team before I take this medication? They need to know if you have any of these conditions: Anemia from low levels of vitamin B12 in the blood An unusual or allergic reaction to leucovorin , folic acid, other medications, foods, dyes, or preservatives Pregnant or trying to get pregnant Breastfeeding How should I use this medication? This medication is injected into a vein or a muscle. It is given by your care team in a hospital or clinic setting. Talk to your care team about the use of this medication in children. Special care may be needed. Overdosage: If you think you have taken too much of this medicine contact a poison control center or emergency room at once. NOTE: This medicine is only for you. Do not share this medicine with others. What if I miss a dose? Keep appointments for follow-up doses. It is important not to miss your dose. Call your care team if you are unable to keep an appointment. What may interact with this medication? Capecitabine  Fluorouracil  Phenobarbital Phenytoin Primidone Trimethoprim;sulfamethoxazole This list may not describe all possible interactions. Give your health care provider a list of all the medicines, herbs, non-prescription drugs, or dietary supplements you use. Also tell them if you smoke, drink alcohol, or use illegal drugs. Some items may interact with your medicine. What should I watch for while using this medication? Your condition will be monitored carefully while you are receiving this medication. This medication may increase the side effects of 5-fluorouracil . Tell your care team if you have diarrhea or mouth sores that do not get better or that get worse. What side effects may I notice from receiving this  medication? Side effects that you should report to your care team as soon as possible: Allergic reactions--skin rash, itching, hives, swelling of the face, lips, tongue, or throat This list may not describe all possible side effects. Call your doctor for medical advice about side effects. You may report side effects to FDA at 1-800-FDA-1088. Where should I keep my medication? This medication is given in a hospital or clinic. It will not be stored at home. NOTE: This sheet is a summary. It may not cover all possible information. If you have questions about this medicine, talk to your doctor, pharmacist, or health care provider.  2024 Elsevier/Gold Standard (2021-10-30 00:00:00)  Irinotecan  Injection What is this medication? IRINOTECAN  (ir in oh TEE kan) treats some types of cancer. It works by slowing down the growth of cancer cells. This medicine may be used for other purposes; ask your health care provider or pharmacist if you have questions. COMMON BRAND NAME(S): Camptosar  What should I tell my care team before I take this medication? They need to know if you have any of these conditions: Dehydration Diarrhea Infection, especially a viral infection, such as chickenpox, cold sores, herpes Liver disease Low blood cell levels (white cells, red cells, and platelets)  Low levels of electrolytes, such as calcium , magnesium , or potassium in your blood Recent or ongoing radiation An unusual or allergic reaction to irinotecan , other medications, foods, dyes, or preservatives If you or your partner are pregnant or trying to get pregnant Breast-feeding How should I use this medication? This medication is injected into a vein. It is given by your care team in a hospital or clinic setting. Talk to your care team about the use of this medication in children. Special care may be needed. Overdosage: If you think you have taken too much of this medicine contact a poison control center or emergency room  at once. NOTE: This medicine is only for you. Do not share this medicine with others. What if I miss a dose? Keep appointments for follow-up doses. It is important not to miss your dose. Call your care team if you are unable to keep an appointment. What may interact with this medication? Do not take this medication with any of the following: Cobicistat Itraconazole This medication may also interact with the following: Certain antibiotics, such as clarithromycin, rifampin, rifabutin Certain antivirals for HIV or AIDS Certain medications for fungal infections, such as ketoconazole, posaconazole, voriconazole Certain medications for seizures, such as carbamazepine, phenobarbital, phenytoin Gemfibrozil Nefazodone St. John's wort This list may not describe all possible interactions. Give your health care provider a list of all the medicines, herbs, non-prescription drugs, or dietary supplements you use. Also tell them if you smoke, drink alcohol, or use illegal drugs. Some items may interact with your medicine. What should I watch for while using this medication? Your condition will be monitored carefully while you are receiving this medication. You may need blood work while taking this medication. This medication may make you feel generally unwell. This is not uncommon as chemotherapy can affect healthy cells as well as cancer cells. Report any side effects. Continue your course of treatment even though you feel ill unless your care team tells you to stop. This medication can cause serious side effects. To reduce the risk, your care team may give you other medications to take before receiving this one. Be sure to follow the directions from your care team. This medication may affect your coordination, reaction time, or judgement. Do not drive or operate machinery until you know how this medication affects you. Sit up or stand slowly to reduce the risk of dizzy or fainting spells. Drinking alcohol  with this medication can increase the risk of these side effects. This medication may increase your risk of getting an infection. Call your care team for advice if you get a fever, chills, sore throat, or other symptoms of a cold or flu. Do not treat yourself. Try to avoid being around people who are sick. Avoid taking medications that contain aspirin, acetaminophen , ibuprofen, naproxen, or ketoprofen unless instructed by your care team. These medications may hide a fever. This medication may increase your risk to bruise or bleed. Call your care team if you notice any unusual bleeding. Be careful brushing or flossing your teeth or using a toothpick because you may get an infection or bleed more easily. If you have any dental work done, tell your dentist you are receiving this medication. Talk to your care team if you or your partner are pregnant or think either of you might be pregnant. This medication can cause serious birth defects if taken during pregnancy and for 6 months after the last dose. You will need a negative pregnancy test before starting this medication.  Contraception is recommended while taking this medication and for 6 months after the last dose. Your care team can help you find the option that works for you. Do not father a child while taking this medication and for 3 months after the last dose. Use a condom for contraception during this time period. Do not breastfeed while taking this medication and for 7 days after the last dose. This medication may cause infertility. Talk to your care team if you are concerned about your fertility. What side effects may I notice from receiving this medication? Side effects that you should report to your care team as soon as possible: Allergic reactions--skin rash, itching, hives, swelling of the face, lips, tongue, or throat Dry cough, shortness of breath or trouble breathing Increased saliva or tears, increased sweating, stomach cramping, diarrhea,  small pupils, unusual weakness or fatigue, slow heartbeat Infection--fever, chills, cough, sore throat, wounds that don't heal, pain or trouble when passing urine, general feeling of discomfort or being unwell Kidney injury--decrease in the amount of urine, swelling of the ankles, hands, or feet Low red blood cell level--unusual weakness or fatigue, dizziness, headache, trouble breathing Severe or prolonged diarrhea Unusual bruising or bleeding Side effects that usually do not require medical attention (report to your care team if they continue or are bothersome): Constipation Diarrhea Hair loss Loss of appetite Nausea Stomach pain This list may not describe all possible side effects. Call your doctor for medical advice about side effects. You may report side effects to FDA at 1-800-FDA-1088. Where should I keep my medication? This medication is given in a hospital or clinic. It will not be stored at home. NOTE: This sheet is a summary. It may not cover all possible information. If you have questions about this medicine, talk to your doctor, pharmacist, or health care provider.  2024 Elsevier/Gold Standard (2021-10-08 00:00:00)      To help prevent nausea and vomiting after your treatment, we encourage you to take your nausea medication as directed.  BELOW ARE SYMPTOMS THAT SHOULD BE REPORTED IMMEDIATELY: *FEVER GREATER THAN 100.4 F (38 C) OR HIGHER *CHILLS OR SWEATING *NAUSEA AND VOMITING THAT IS NOT CONTROLLED WITH YOUR NAUSEA MEDICATION *UNUSUAL SHORTNESS OF BREATH *UNUSUAL BRUISING OR BLEEDING *URINARY PROBLEMS (pain or burning when urinating, or frequent urination) *BOWEL PROBLEMS (unusual diarrhea, constipation, pain near the anus) TENDERNESS IN MOUTH AND THROAT WITH OR WITHOUT PRESENCE OF ULCERS (sore throat, sores in mouth, or a toothache) UNUSUAL RASH, SWELLING OR PAIN  UNUSUAL VAGINAL DISCHARGE OR ITCHING   Items with * indicate a potential emergency and should be  followed up as soon as possible or go to the Emergency Department if any problems should occur.  Please show the CHEMOTHERAPY ALERT CARD or IMMUNOTHERAPY ALERT CARD at check-in to the Emergency Department and triage nurse.  Should you have questions after your visit or need to cancel or reschedule your appointment, please contact [facility] - A DEPT OF [facility]  Dept: [phone]  and follow the prompts.  Office hours are 8:00 a.m. to 4:30 p.m. Monday - Friday. Please note that voicemails left after 4:00 p.m. may not be returned until the following business day.  We are closed weekends and major holidays. You have access to a nurse at all times for urgent questions. Please call the main number to the clinic Dept: [phone] and follow the prompts.   For any non-urgent questions, you may also contact your provider using MyChart. We now  offer e-Visits for anyone 47 and older to request care online for non-urgent symptoms. For details visit [email or web address].   Also download the MyChart app! Go to the app store, search MyChart, open the app, select [facility], and log in with your MyChart username and password.

## 2024-01-15 LAB — CANCER ANTIGEN 19-9: CA 19-9: 86 U/mL — ABNORMAL HIGH (ref 0–35)

## 2024-01-16 ENCOUNTER — Inpatient Hospital Stay

## 2024-01-16 VITALS — BP 123/79 | HR 69 | Temp 97.6°F | Resp 18

## 2024-01-16 DIAGNOSIS — Z85528 Personal history of other malignant neoplasm of kidney: Secondary | ICD-10-CM | POA: Diagnosis not present

## 2024-01-16 DIAGNOSIS — E1140 Type 2 diabetes mellitus with diabetic neuropathy, unspecified: Secondary | ICD-10-CM | POA: Diagnosis not present

## 2024-01-16 DIAGNOSIS — C3411 Malignant neoplasm of upper lobe, right bronchus or lung: Secondary | ICD-10-CM | POA: Diagnosis not present

## 2024-01-16 DIAGNOSIS — I10 Essential (primary) hypertension: Secondary | ICD-10-CM | POA: Diagnosis not present

## 2024-01-16 DIAGNOSIS — Z905 Acquired absence of kidney: Secondary | ICD-10-CM | POA: Diagnosis not present

## 2024-01-16 DIAGNOSIS — Z5111 Encounter for antineoplastic chemotherapy: Secondary | ICD-10-CM | POA: Diagnosis not present

## 2024-01-16 DIAGNOSIS — Z7901 Long term (current) use of anticoagulants: Secondary | ICD-10-CM | POA: Diagnosis not present

## 2024-01-16 DIAGNOSIS — Z79899 Other long term (current) drug therapy: Secondary | ICD-10-CM | POA: Diagnosis not present

## 2024-01-16 DIAGNOSIS — C250 Malignant neoplasm of head of pancreas: Secondary | ICD-10-CM

## 2024-01-16 DIAGNOSIS — Z86718 Personal history of other venous thrombosis and embolism: Secondary | ICD-10-CM | POA: Diagnosis not present

## 2024-01-16 DIAGNOSIS — C7801 Secondary malignant neoplasm of right lung: Secondary | ICD-10-CM | POA: Diagnosis not present

## 2024-01-16 DIAGNOSIS — N6320 Unspecified lump in the left breast, unspecified quadrant: Secondary | ICD-10-CM | POA: Diagnosis not present

## 2024-01-16 DIAGNOSIS — C3432 Malignant neoplasm of lower lobe, left bronchus or lung: Secondary | ICD-10-CM | POA: Diagnosis not present

## 2024-01-16 DIAGNOSIS — C7802 Secondary malignant neoplasm of left lung: Secondary | ICD-10-CM | POA: Diagnosis not present

## 2024-01-16 MED ORDER — SODIUM CHLORIDE 0.9% FLUSH: 10.0000 mL | INTRAVENOUS | Status: DC | PRN

## 2024-01-16 NOTE — Patient Instructions (Signed)

## 2024-01-19 DIAGNOSIS — C259 Malignant neoplasm of pancreas, unspecified: Secondary | ICD-10-CM | POA: Diagnosis not present

## 2024-02-01 ENCOUNTER — Other Ambulatory Visit: Payer: Self-pay | Admitting: Oncology

## 2024-02-01 DIAGNOSIS — C250 Malignant neoplasm of head of pancreas: Secondary | ICD-10-CM

## 2024-02-04 ENCOUNTER — Inpatient Hospital Stay (HOSPITAL_BASED_OUTPATIENT_CLINIC_OR_DEPARTMENT_OTHER): Admitting: Nurse Practitioner

## 2024-02-04 ENCOUNTER — Encounter: Payer: Self-pay | Admitting: *Deleted

## 2024-02-04 ENCOUNTER — Inpatient Hospital Stay

## 2024-02-04 ENCOUNTER — Ambulatory Visit (HOSPITAL_BASED_OUTPATIENT_CLINIC_OR_DEPARTMENT_OTHER)
Admission: RE | Admit: 2024-02-04 | Discharge: 2024-02-04 | Disposition: A | Discharge status: Home / Self Care | Source: Ambulatory Visit | Attending: Nurse Practitioner | Admitting: Nurse Practitioner

## 2024-02-04 ENCOUNTER — Encounter: Payer: Self-pay | Admitting: Nurse Practitioner

## 2024-02-04 VITALS — BP 107/84 | HR 97 | Temp 98.1°F | Resp 18 | Ht 68.0 in | Wt 184.9 lb

## 2024-02-04 DIAGNOSIS — C250 Malignant neoplasm of head of pancreas: Secondary | ICD-10-CM | POA: Insufficient documentation

## 2024-02-04 DIAGNOSIS — Z7901 Long term (current) use of anticoagulants: Secondary | ICD-10-CM | POA: Diagnosis not present

## 2024-02-04 DIAGNOSIS — E876 Hypokalemia: Secondary | ICD-10-CM | POA: Insufficient documentation

## 2024-02-04 DIAGNOSIS — C3432 Malignant neoplasm of lower lobe, left bronchus or lung: Secondary | ICD-10-CM | POA: Diagnosis not present

## 2024-02-04 DIAGNOSIS — Z85528 Personal history of other malignant neoplasm of kidney: Secondary | ICD-10-CM | POA: Diagnosis not present

## 2024-02-04 DIAGNOSIS — Z86718 Personal history of other venous thrombosis and embolism: Secondary | ICD-10-CM | POA: Diagnosis not present

## 2024-02-04 DIAGNOSIS — R748 Abnormal levels of other serum enzymes: Secondary | ICD-10-CM | POA: Diagnosis not present

## 2024-02-04 DIAGNOSIS — I10 Essential (primary) hypertension: Secondary | ICD-10-CM | POA: Diagnosis not present

## 2024-02-04 DIAGNOSIS — C7802 Secondary malignant neoplasm of left lung: Secondary | ICD-10-CM | POA: Diagnosis not present

## 2024-02-04 DIAGNOSIS — C259 Malignant neoplasm of pancreas, unspecified: Secondary | ICD-10-CM | POA: Diagnosis not present

## 2024-02-04 DIAGNOSIS — Z79899 Other long term (current) drug therapy: Secondary | ICD-10-CM | POA: Diagnosis not present

## 2024-02-04 DIAGNOSIS — C3411 Malignant neoplasm of upper lobe, right bronchus or lung: Secondary | ICD-10-CM | POA: Diagnosis not present

## 2024-02-04 DIAGNOSIS — Z905 Acquired absence of kidney: Secondary | ICD-10-CM | POA: Diagnosis not present

## 2024-02-04 DIAGNOSIS — Z5111 Encounter for antineoplastic chemotherapy: Secondary | ICD-10-CM | POA: Diagnosis not present

## 2024-02-04 DIAGNOSIS — C7801 Secondary malignant neoplasm of right lung: Secondary | ICD-10-CM | POA: Diagnosis not present

## 2024-02-04 DIAGNOSIS — E1140 Type 2 diabetes mellitus with diabetic neuropathy, unspecified: Secondary | ICD-10-CM | POA: Diagnosis not present

## 2024-02-04 DIAGNOSIS — N6320 Unspecified lump in the left breast, unspecified quadrant: Secondary | ICD-10-CM | POA: Diagnosis not present

## 2024-02-04 DIAGNOSIS — K769 Liver disease, unspecified: Secondary | ICD-10-CM | POA: Diagnosis not present

## 2024-02-04 LAB — CBC WITH DIFFERENTIAL (CANCER CENTER ONLY)
Abs Immature Granulocytes: 0.01 K/uL (ref 0.00–0.07)
Basophils Absolute: 0 K/uL (ref 0.0–0.1)
Basophils Relative: 1 %
Eosinophils Absolute: 0.2 K/uL (ref 0.0–0.5)
Eosinophils Relative: 4 %
HCT: 34.2 % — ABNORMAL LOW (ref 36.0–46.0)
Hemoglobin: 11 g/dL — ABNORMAL LOW (ref 12.0–15.0)
Immature Granulocytes: 0 %
Lymphocytes Relative: 21 %
Lymphs Abs: 0.8 K/uL (ref 0.7–4.0)
MCH: 32.3 pg (ref 26.0–34.0)
MCHC: 32.2 g/dL (ref 30.0–36.0)
MCV: 100.3 fL — ABNORMAL HIGH (ref 80.0–100.0)
Monocytes Absolute: 0.6 K/uL (ref 0.1–1.0)
Monocytes Relative: 15 %
Neutro Abs: 2.2 K/uL (ref 1.7–7.7)
Neutrophils Relative %: 59 %
Platelet Count: 358 K/uL (ref 150–400)
RBC: 3.41 MIL/uL — ABNORMAL LOW (ref 3.87–5.11)
RDW: 13.2 % (ref 11.5–15.5)
WBC Count: 3.8 K/uL — ABNORMAL LOW (ref 4.0–10.5)
nRBC: 0 % (ref 0.0–0.2)

## 2024-02-04 LAB — CMP (CANCER CENTER ONLY)
ALT: 156 U/L — ABNORMAL HIGH (ref 0–44)
AST: 181 U/L (ref 15–41)
Albumin: 3.7 g/dL (ref 3.5–5.0)
Alkaline Phosphatase: 575 U/L — ABNORMAL HIGH (ref 38–126)
Anion gap: 9 (ref 5–15)
BUN: 9 mg/dL (ref 6–20)
CO2: 24 mmol/L (ref 22–32)
Calcium: 9.4 mg/dL (ref 8.9–10.3)
Chloride: 103 mmol/L (ref 98–111)
Creatinine: 1.12 mg/dL — ABNORMAL HIGH (ref 0.44–1.00)
GFR, Estimated: 57 mL/min — ABNORMAL LOW (ref >60)
Glucose, Bld: 316 mg/dL — ABNORMAL HIGH (ref 70–99)
Potassium: 4.1 mmol/L (ref 3.5–5.1)
Sodium: 137 mmol/L (ref 135–145)
Total Bilirubin: 1.8 mg/dL — ABNORMAL HIGH (ref 0.0–1.2)
Total Protein: 6.9 g/dL (ref 6.5–8.1)

## 2024-02-04 LAB — MAGNESIUM: Magnesium: 1.9 mg/dL (ref 1.7–2.4)

## 2024-02-04 MED ORDER — HYDROXYZINE HCL 10 MG PO TABS: 10.0000 mg | ORAL_TABLET | Freq: Three times a day (TID) | ORAL | 0 refills | Status: AC | PRN

## 2024-02-04 MED ORDER — IOHEXOL 300 MG/ML  SOLN
100.0000 mL | Freq: Once | INTRAMUSCULAR | Status: AC | PRN
  Administered 2024-02-04: 100 mL via INTRAVENOUS

## 2024-02-04 MED ORDER — MAGNESIUM OXIDE -MG SUPPLEMENT 400 (240 MG) MG PO TABS: ORAL_TABLET | ORAL | 1 refills | Status: AC

## 2024-02-04 NOTE — Progress Notes (Signed)
 CRITICAL VALUE STICKER  CRITICAL VALUE:  AST 181  ALT 156   RECEIVER (on-site recipient of call): Sari Modena RN  DATE & TIME NOTIFIED:  02/04/2024   [id number]  MESSENGER (representative from lab): Thersia  MD NOTIFIED: Olam Ned NP  TIME OF NOTIFICATION:  [id number]  RESPONSE:  Notified provider

## 2024-02-04 NOTE — Progress Notes (Signed)
 Alyssa Ross OFFICE PROGRESS NOTE   Diagnosis: Pancreas cancer  INTERVAL HISTORY:   Alyssa Ross returns as scheduled.  She completed another cycle of 5-FU/liposomal irinotecan  01/14/2024.  She denies nausea/vomiting.  No mouth sores.  She may have had 1 or 2 loose stools.  No fever or rash following treatment.  No abdominal pain.  She developed pruritus of the upper body earlier this morning, mainly on the hands and mildly present on the abdomen and arms.  No rash.  No dark urine.  No jaundice.  She is more tired than usual.  No fever or shaking chills.  Objective:  Vital signs in last 24 hours:  Blood pressure 107/84, pulse 97, temperature 98.1 F (36.7 C), temperature source Temporal, resp. rate 18, height 5' 8 (1.727 m), weight 184 lb 14.4 oz (83.9 kg), last menstrual period 09/17/2012, SpO2 100%.    HEENT: Sclera anicteric.  No thrush or ulcers. Resp: Lungs clear bilaterally. Cardio: Regular rate and rhythm. GI: Nontender.  No hepatosplenomegaly. Vascular: No leg edema. Skin: No rash.  Palms without erythema. Port-A-Cath without erythema.  Lab Results:  Lab Results  Component Value Date   WBC 3.8 (L) 02/04/2024   HGB 11.0 (L) 02/04/2024   HCT 34.2 (L) 02/04/2024   MCV 100.3 (H) 02/04/2024   PLT 358 02/04/2024   NEUTROABS 2.2 02/04/2024    Imaging:  No results found.  Medications: I have reviewed the patient's current medications.  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 Tumor invades the duodenal wall and vascular groove, resection margins negative, 4/34 lymph nodes positive MSI-stable, tumor showed instability in 2 loci as did adjacent normal tissue Foundation 1-K-ras G12 V, microsatellite status and tumor mutation burden cannot be determined EUS FNA biopsy of pancreas mass on 07/03/2018-well-differentiated neuroendocrine tumor CTs 01/29/2019-ill-defined pancreas head mass, 5 pulmonary  nodules-1 with a small amount of central cavitation, tumor abuts the left margin of the portal vein indistinct density surrounding, hepatic artery, complex cystic lesion of the right kidney, right adrenal mass-characterized as an adenoma on a [facility] MRI 12/21/2018 [facility]  03/03/2019-no focal pancreas activity, no tracer accumulation within the suspicious pulmonary nodules, left uterine mass with tracer accumulation felt to represent a leiomyoma Elevated preoperative CA 19-9--CA 19-9 186 on 01/18/2019 CT chest 04/16/2019-multiple bilateral pulmonary nodules, some with increased cavitation, stable in size Cycle 1 FOLFIRINOX 04/27/2019 Cycle 2 FOLFIRINOX 05/11/2019 Cycle 3 FOLFIRINOX 05/23/2019 Cycle 4 FOLFIRINOX 06/08/2019 Cycle 5 FOLFIRINOX 06/22/2019 CT chest 07/02/2019-stable size of bilateral pulmonary nodules.  Dominant cavitary lesions in both lungs show increased cavitation with thinner walls.  Stable 2.1 cm right adrenal nodule. Cycle 6 FOLFIRINOX 07/06/2019 Cycle 7 FOLFIRINOX 07/21/2019 Cycle 8 FOLFIRINOX 08/03/2019, oxaliplatin  deleted secondary to neuropathy CT chest 08/24/2019-decreased size of several lung nodules with resolution of a left upper lobe nodule, no new nodules Radiation to the pancreas surgical area with concurrent Xeloda  09/13/2019-10/20/2019  CTs 11/29/2019-multiple small pulmonary nodules scattered throughout the lungs bilaterally, appear increased in number and size. No definite evidence of metastatic disease in the abdomen or pelvis. Markedly enlarged and heterogeneous appearing uterus, likely to represent multifocal fibroids. 1 of these lesions appears to be an exophytic subserosal fibroid in the posterior lateral aspect of the uterine body on the left side although this comes in close proximity to the left adnexa such that a primary ovarian lesion is difficult to completely exclude. CTs 02/07/2020-slight enlargement of bilateral lung nodules, some are cavitary, no evidence of  metastatic disease in the  abdomen or pelvis, stable right adrenal nodule, uterine fibroids CTs 04/26/2020-mild enlargement of pulmonary nodules, slight increase in trace pelvic fluid, new soft tissue thickening inferior to the cecal tip suspicious for peritoneal metastasis CT 05/26/2020-improved appearance of soft tissue at the inferior tip of the cecum, mildly thickened short appendix-findings suggestive of resolving appendicitis, stable small bibasilar pulmonary nodules, fibroids Plan biopsy of right cecal tip soft tissue canceled secondary to radiologic improvement CT chest 08/02/2020-enlargement and progressive cavitation of multiple bilateral lung nodules.  Some new nodules are present. CTs 10/24/2020- increase in size of pulmonary nodules, no new nodules, no evidence of metastatic disease in the abdomen, stable right adrenal nodule CT 01/09/2021-slight interval enlargement of pulmonary nodules, stable right adrenal nodule Navigation bronchoscopy 01/30/2021-left lower lobe cavitary nodule FNA-adenocarcinoma, brushing-adenocarcinoma.  Left lower lobe lavage-adenocarcinoma.  Right upper lobe brushing and FNA biopsy of cavitary nodule-adenocarcinoma-immunohistochemical profile consistent with pancreas adenocarcinoma Cycle 1 gemcitabine /Abraxane  03/28/2021 Cycle 2 gemcitabine /Abraxane  04/11/2021 Cycle 3 gemcitabine /Abraxane  04/25/2021 Cycle 4 gemcitabine /Abraxane  05/09/2021 Cycle 5 gemcitabine /Abraxane  05/23/2021 CT chest 06/05/2021-interval cavitation of multiple pulmonary nodules, some nodules have decreased in size, no new or enlarging nodules Cycle 6 gemcitabine /Abraxane  06/06/2021 Cycle 7 gemcitabine /Abraxane  06/21/2021 Cycle 8 gemcitabine /Abraxane  07/05/2021 Cycle 9 Gemcitabine /Abraxane  07/19/2021 Cycle 10 gemcitabine  08/01/2021-Abraxane  held secondary to neuropathy CT chest 08/13/2021-mild decrease in size and wall thickness of multiple cavitary nodules, no new or progressive disease in the chest,  indeterminate low-attenuation right liver lesions Cycle 11 gemcitabine  08/16/2021-Abraxane  held secondary to neuropathy Cycle 12 gemcitabine  08/30/2021-Abraxane  held secondary to neuropathy Cycle 13 gemcitabine  09/13/2021-Abraxane  held secondary to neuropathy Cycle 14 gemcitabine  09/27/2021-Abraxane  held secondary to neuropathy Cycle 15 gemcitabine  10/11/2021-Abraxane  held secondary to neuropathy CTs 10/22/2021-no change in multiple cavitary lung nodules, no evidence of disease progression, ill-defined hypodense lesion in the posterior right liver suspicious for metastatic disease Cycle 16 gemcitabine  10/25/2021 Cycle 17 gemcitabine  11/08/2021 Cycle 18 Gemcitabine  11/22/2021 Cycle 19 gemcitabine  12/06/2021 Cycle 20 Gemcitabine  12/20/2021 CT 12/31/2021-mild increase in size of bilateral pulmonary metastases, stable subtle continuation right liver lesions Cycle 20 gemcitabine /Abraxane  01/03/2022 Cycle 21 gemcitabine /Abraxane  01/17/2022 Cycle 22 gemcitabine /Abraxane  01/31/2022 Cycle 23 gemcitabine /Abraxane  02/14/2022 Cycle 24 gemcitabine /Abraxane  02/28/2022 CTs 03/11/2022-widespread metastatic disease to the lungs again noted with slight involution of several of the pulmonary nodules, no definite new nodules noted; interval cavitation of solid lesion in the posterior aspect right lobe of the liver, no new liver lesions noted. Cycle 25 Gemcitabine /Abraxane  03/14/2022 Cycle 26 gemcitabine /Abraxane  03/28/2022 Cycle 27 gemcitabine /Abraxane  04/25/2022 Cycle 28 gemcitabine /Abraxane  05/09/2022 Cycle 29 Gemcitabine  05/23/2022, Abraxane  held due to neuropathy CTs 06/04/2022-stable multifocal cavitary pulmonary nodules.  No definite new nodules.  No new nodules greater than a centimeter.  Decreased size of the right posterior hepatic lobe lesion.  Generalized heterogeneity and new focal areas of hypodensity in the liver. Cycle 30 Gemcitabine  06/06/2022, Abraxane  held due to neuropathy Cycle 31 gemcitabine  06/20/2022, Abraxane   held due to neuropathy Cycle 32 gemcitabine  07/04/2022, Abraxane  held due to neuropathy Cycle 33 gemcitabine   07/18/2022 ,Abraxane  held due to neuropathy Cycle 34 gemcitabine  08/15/2022, Abraxane  held due to neuropathy Cycle 35 gemcitabine  08/29/2022, Abraxane  held due to neuropathy CTs 09/10/2022-enlarging pulmonary metastases.  Subtle poorly defined hypoattenuating lesions in the liver, less well-seen than on 06/04/2022.  Suspected new lesion in the dome of the right hepatic lobe Cycle 36 gemcitabine /Abraxane  09/12/2022 Cycle 37 gemcitabine /Abraxane  09/26/2022 Cycle 38 gemcitabine /Abraxane  10/10/2022 Cycle 39 gemcitabine /Abraxane  11/07/2022 Cycle 40 gemcitabine /Abraxane  11/21/2022 Cycle 41 gemcitabine /Abraxane  12/05/2022 Cycle 42 gemcitabine /Abraxane  12/19/2022 CT is 12/30/2022-new left hepatic lesion, stable bilateral pulmonary metastases Cycle 43 gemcitabine /Abraxane   01/02/2023 CT-guided biopsy of liver lesion 01/27/2023-acute/chronic inflammation, negative for malignancy, culture negative Cycle 44 gemcitabine /Abraxane  01/30/2023 Cycle 45 gemcitabine /Abraxane  02/13/2023 Cycle 46 gemcitabine /Abraxane  02/27/2023 Cycle 47 gemcitabine /Abraxane  03/13/2023 Cycle 48 gemcitabine /Abraxane  03/27/2023 CTs 04/21/2023-numerous solid and cavitary lung nodules overall similar, some slightly enlarged; multiple new and enlarging rim-enhancing liver lesions. Cycle 1 FOLFIRI with liposomal irinotecan  05/21/2023 Cycle 2 held 06/05/2023 due to neutropenia Cycle 2 FOLFIRI with liposomal irinotecan  06/18/2023, irinotecan  dose reduced (white cell growth factor declined per patient) Cycle 3 FOLFIRI with liposomal irinotecan  07/02/2023 Cycle 4 FOLFIRI with liposomal irinotecan  07/16/2023 Cycle 5 FOLFIRI with liposomal irinotecan  07/30/2023 Cycle 6 FOLFIRI with liposomal irinotecan  08/13/2023 CTs 08/20/2023-numerous cavitary lesions seen in the lungs, extent and distribution appear similar.  A few lesions are slightly smaller.  Numerous liver  lesions are now smaller.  Stable right adrenal nodule, adenoma favored. Cycle 7 FOLFIRI with liposomal irinotecan  08/27/2023 Cycle 8 FOLFIRI with liposomal irinotecan  09/11/2023 Cycle 9 FOLFIRI with liposomal irinotecan  09/24/2023 Cycle 10 FOLFIRI with liposomal irinotecan  10/08/2023 Cycle 11 FOLFIRI with liposomal irinotecan  10/22/2023 Cycle 12 FOLFIRI with liposomal irinotecan  11/05/2023 CT 11/17/2023-majority of multiple irregular thick-walled cavitary lesions throughout bilateral lungs grossly unchanged or slightly decreased in size.  Redemonstration of peripheral wedge-shaped ill-defined hypoattenuating areas in the liver suggesting treatment response.  No discrete new suspicious liver lesions seen.  No new metastatic disease. 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  Apixaban  10/24/2022 Port-A-Cath malfunction 08/01/2023.  Port-A-Cath removed and replaced  08/08/2023.  Disposition: Alyssa Ross appears stable.  She has completed 16 cycles of FOLFIRI with liposomal irinotecan .  She is tolerating treatment without significant acute toxicity.  She presents today for routine follow-up prior to the next cycle.  She reports onset of pruritus earlier this morning, fatigue.  LFTs are elevated.  We are referring her for stat CT scans, question biliary obstruction.  She understands to contact the office with fever, chills.  Plan reviewed with Dr. Cloretta.  Olam Ned ANP/GNP-BC   02/04/2024  9:05 AM

## 2024-02-04 NOTE — Patient Instructions (Signed)

## 2024-02-05 ENCOUNTER — Encounter: Payer: Self-pay | Admitting: Nurse Practitioner

## 2024-02-05 ENCOUNTER — Telehealth: Payer: Self-pay | Admitting: Oncology

## 2024-02-05 ENCOUNTER — Telehealth: Payer: Self-pay | Admitting: Nurse Practitioner

## 2024-02-05 LAB — CANCER ANTIGEN 19-9: CA 19-9: 114 U/mL — ABNORMAL HIGH (ref 0–35)

## 2024-02-05 NOTE — Telephone Encounter (Signed)
 PT called to cancel [facility] appt, didn't receive TXT yesterday. 8/27

## 2024-02-05 NOTE — Telephone Encounter (Signed)
 I called Alyssa Ross to review CT scan result.  Per Dr. Cloretta no explanation for the abnormal LFTs.  She continues to have pruritus.  The pruritus was worse during the night, some better today.  We talked about repeating labs and rescheduling chemotherapy to next week.  She is unsure when she could come in.  She would like for me to call her tomorrow to discuss this further.

## 2024-02-06 ENCOUNTER — Telehealth: Payer: Self-pay | Admitting: Nurse Practitioner

## 2024-02-06 ENCOUNTER — Inpatient Hospital Stay

## 2024-02-06 DIAGNOSIS — C250 Malignant neoplasm of head of pancreas: Secondary | ICD-10-CM

## 2024-02-06 NOTE — Telephone Encounter (Signed)
 Alyssa Ross reports she is feeling better, less pruritus today.  She takes hydroxyzine  if needed.  Her case will be presented at the GI tumor conference.  We will contact her with further recommendations after the conference.  She will return for repeat labs 02/10/2024.

## 2024-02-10 ENCOUNTER — Inpatient Hospital Stay: Attending: Genetic Counselor

## 2024-02-10 ENCOUNTER — Telehealth

## 2024-02-10 ENCOUNTER — Inpatient Hospital Stay

## 2024-02-10 DIAGNOSIS — Z5111 Encounter for antineoplastic chemotherapy: Secondary | ICD-10-CM | POA: Insufficient documentation

## 2024-02-10 DIAGNOSIS — C7802 Secondary malignant neoplasm of left lung: Secondary | ICD-10-CM | POA: Insufficient documentation

## 2024-02-10 DIAGNOSIS — Z7901 Long term (current) use of anticoagulants: Secondary | ICD-10-CM | POA: Diagnosis not present

## 2024-02-10 DIAGNOSIS — Z905 Acquired absence of kidney: Secondary | ICD-10-CM | POA: Insufficient documentation

## 2024-02-10 DIAGNOSIS — C3432 Malignant neoplasm of lower lobe, left bronchus or lung: Secondary | ICD-10-CM | POA: Insufficient documentation

## 2024-02-10 DIAGNOSIS — C7801 Secondary malignant neoplasm of right lung: Secondary | ICD-10-CM | POA: Diagnosis not present

## 2024-02-10 DIAGNOSIS — Z85528 Personal history of other malignant neoplasm of kidney: Secondary | ICD-10-CM | POA: Insufficient documentation

## 2024-02-10 DIAGNOSIS — C250 Malignant neoplasm of head of pancreas: Secondary | ICD-10-CM | POA: Insufficient documentation

## 2024-02-10 DIAGNOSIS — Z86718 Personal history of other venous thrombosis and embolism: Secondary | ICD-10-CM | POA: Insufficient documentation

## 2024-02-10 DIAGNOSIS — C3411 Malignant neoplasm of upper lobe, right bronchus or lung: Secondary | ICD-10-CM | POA: Diagnosis not present

## 2024-02-10 DIAGNOSIS — N6320 Unspecified lump in the left breast, unspecified quadrant: Secondary | ICD-10-CM | POA: Diagnosis not present

## 2024-02-10 LAB — CMP (CANCER CENTER ONLY)
ALT: 42 U/L (ref 0–44)
AST: 45 U/L — ABNORMAL HIGH (ref 15–41)
Albumin: 3.6 g/dL (ref 3.5–5.0)
Alkaline Phosphatase: 413 U/L — ABNORMAL HIGH (ref 38–126)
Anion gap: 9 (ref 5–15)
BUN: 8 mg/dL (ref 6–20)
CO2: 23 mmol/L (ref 22–32)
Calcium: 8.9 mg/dL (ref 8.9–10.3)
Chloride: 105 mmol/L (ref 98–111)
Creatinine: 1.3 mg/dL — ABNORMAL HIGH (ref 0.44–1.00)
GFR, Estimated: 48 mL/min — ABNORMAL LOW (ref >60)
Glucose, Bld: 256 mg/dL — ABNORMAL HIGH (ref 70–99)
Potassium: 4 mmol/L (ref 3.5–5.1)
Sodium: 137 mmol/L (ref 135–145)
Total Bilirubin: 0.4 mg/dL (ref 0.0–1.2)
Total Protein: 6.8 g/dL (ref 6.5–8.1)

## 2024-02-11 ENCOUNTER — Other Ambulatory Visit: Payer: Self-pay

## 2024-02-11 ENCOUNTER — Telehealth: Payer: Self-pay | Admitting: Nurse Practitioner

## 2024-02-11 ENCOUNTER — Encounter: Payer: Self-pay | Admitting: Oncology

## 2024-02-11 NOTE — Telephone Encounter (Signed)
 I contacted Ms. Kirt regarding tumor board discussion.  No evidence of biliary ductal dilatation on recent CTs.  LFTs improved 02/10/2024.  Plan is to resume chemotherapy.  I sent a message to the scheduling team to contact her to schedule the next chemotherapy.

## 2024-02-12 ENCOUNTER — Telehealth: Payer: Self-pay | Admitting: Nurse Practitioner

## 2024-02-12 NOTE — Telephone Encounter (Signed)
 Appt date and time confirmed with PT.

## 2024-02-13 ENCOUNTER — Encounter: Payer: Self-pay | Admitting: Oncology

## 2024-02-13 ENCOUNTER — Telehealth: Payer: Self-pay | Admitting: Pharmacy Technician

## 2024-02-13 NOTE — Progress Notes (Signed)
 The proposed treatment discussed in conference is for discussion purpose only and is not a binding recommendation.  The patients have not been physically examined, or presented with their treatment options.  Therefore, final treatment plans cannot be decided.

## 2024-02-13 NOTE — Progress Notes (Signed)
   02/13/2024 Name: Alyssa Ross MRN: [id number] DOB: [birth year]  Patient is appearing on a report for [facility] [place] [employer] Diabetes and last engaged with the clinical pharmacist to discuss diabetes on 01/13/2024. Contacted patient today to discuss diabetes management and completed medication review.   Diabetes Plan from last clinical pharmacist appointment:  Plan: Since Patient was against increasing her insulin  dose, I will follow up with her after her Endocrinologist's appointment.   Medication Adherence Barriers Identified:  Patient made recommended medication changes per plan: No Patient is currently injecting 17 units of Tresiba  daily. She informs she uses Humalog  on a sliding scale and will inject herself with how ever much she thinks she needs.  Access issues with any new medication or testing device: No Patient informs she has Tresiba  and Humalog  on hand to use. Per Dr first fill hisotry, Tresiba  last sold on 10/14/23 for 24ml or 88 day supply  and Humalog  last sold 05/23/23 for 42ml or 84 day supply.  Patient is checking blood sugars as prescribed: Yes Patient informs she is checking blood sugars but could not give me exact numbers. She informs her blood sugars are up and down and are averaging well over 200. Per Dr Annemarie fill history, FreeStyle sensors last fold for 6 or 84 day supply on 12/11/23.  Medication Adherence Barriers Addressed/Actions Taken:  Reviewed medication changes per plan from last clinical pharmacist note Educated patient to contact pharmacy regarding new prescriptions and refills Reviewed instructions for monitoring blood sugars at home and reminded patient to keep a written log to review with pharmacist Reminded patient of date/time of upcoming clinical pharmacist follow up and any upcoming PCP/specialists visits. Patient denies transportation barriers to the appointment. Yes  Next clinical pharmacist appointment is scheduled for: after Endo appointment on  03/09/2024  Kate Caddy, CPhT [facility] Office: [phone] Email: [email or web address]

## 2024-02-17 ENCOUNTER — Encounter: Payer: Self-pay | Admitting: Nurse Practitioner

## 2024-02-17 ENCOUNTER — Inpatient Hospital Stay (HOSPITAL_BASED_OUTPATIENT_CLINIC_OR_DEPARTMENT_OTHER): Admitting: Nurse Practitioner

## 2024-02-17 DIAGNOSIS — M792 Neuralgia and neuritis, unspecified: Secondary | ICD-10-CM

## 2024-02-17 DIAGNOSIS — D3A8 Other benign neuroendocrine tumors: Secondary | ICD-10-CM | POA: Diagnosis not present

## 2024-02-17 DIAGNOSIS — Z515 Encounter for palliative care: Secondary | ICD-10-CM

## 2024-02-17 NOTE — Progress Notes (Signed)
 Palliative Medicine [facility]  [phone] [phone]   Name: Alyssa Ross Date: 02/17/2024 MRN: [id number]  DOB: [birth year]  Patient Care Team: Jarold Medici, MD as PCP - General (Internal Medicine) Luvenia Morrison, RN (Inactive) as Oncology Nurse Navigator Pickenpack-Cousar, Fannie SAILOR, NP as Nurse Practitioner (Hospice and Palliative Medicine) Jolee Cassius PARAS, [facility] as Pharmacist (Pharmacist)   I connected with Alyssa Ross on 02/17/24 at  3:00 PM EDT by Phone and verified that I am speaking with the correct person using two identifiers.   I discussed the limitations, risks, security and privacy concerns of performing an evaluation and management service by telemedicine and the availability of in-person appointments. I also discussed with the patient that there may be a patient responsible charge related to this service. The patient expressed understanding and agreed to proceed.   Other persons participating in the visit and their role in the encounter: N/A   Patient's location: Home  Provider's location: [facility]    INTERVAL HISTORY: Alyssa Ross is a 58 y.o. female with oncologic medical history including pancreatic cancer (01/2019). The patient is 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 and is currently receiving FOLFIRI with liposomal irinotecan . Palliative ask to see for symptom management and goals of care.   SOCIAL HISTORY:     reports that she has never smoked. She has never used smokeless tobacco. She reports that she does not currently use alcohol. She reports that she does not use drugs.  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  MEDICATIONS:  Current Outpatient Medications  Medication Sig Dispense Refill   apixaban  (ELIQUIS ) 5 MG TABS tablet Take 1 tablet (5 mg total) by mouth 2 (two) times daily. 180 tablet 2   cetirizine (ZYRTEC) 10 MG tablet Take 10 mg by mouth daily.     Continuous Blood Gluc Sensor (FREESTYLE LIBRE 2 SENSOR) MISC APPLY EVERY 14 DAYS 2 each 5   glucose blood test strip Check sugar 2 times daily. 100 each 12   hydrOXYzine  (ATARAX ) 10 MG tablet Take 1 tablet (10 mg total) by mouth 3 (three) times daily as needed. 30 tablet 0   Insulin  Degludec (TRESIBA  FLEXTOUCH [place]) Inject into the skin. 12-18 Units     insulin  lispro (HUMALOG  KWIKPEN) 200 UNIT/ML KwikPen Humalog  8 units with low carb meals (or use 1:7 ratio )  14 units with high carb meals (and high carb snacks)  Correction scale Blood sugar 30 minutes if BG  0- 149 no insulin  150 - 199 1 unit  200 - 249 2 units 250 - 299 3 units 300 - 349 4 units  350 - 399 5 units > 400 6 units and contact clinic [phone]     Insulin  Pen Needle (BD PEN NEEDLE NANO U/F) 32G X 4 MM MISC USE TO INJECT INSULIN  THREE TIMES A DAY 270 each 3   lidocaine -prilocaine  (EMLA ) cream Apply 1 Application topically as directed. Apply 1 hour prior to stick and cover with plastic wrap 30 g 5   magnesium  oxide (MAG-OX) 400 (240 Mg) MG tablet TAKE 1 TABLET(400 MG) BY MOUTH TWICE DAILY 180 tablet 1   ondansetron  (ZOFRAN ) 8 MG tablet Take 1 tablet (8 mg total) by mouth every 8 (eight) hours as needed for nausea or  vomiting (do not begin until 72 hours after chemo). (Patient not taking: Reported on 02/04/2024) 20 tablet 3   Polyethylene Glycol 3350 (MIRALAX PO) Take 17 g by mouth daily.     potassium chloride  SA (KLOR-CON  M) 20 MEQ tablet Take 1 tablet (20 mEq total) by mouth 4 (four) times daily. 360 tablet 1   prochlorperazine  (COMPAZINE ) 10 MG tablet TAKE 1 TABLET(10 MG) BY MOUTH EVERY 6 HOURS AS NEEDED FOR NAUSEA OR VOMITING (Patient not taking: Reported on 02/04/2024) 60 tablet 2   rosuvastatin  (CRESTOR ) 10 MG tablet Take 1 tablet (10 mg total) by mouth 3 (three) times a week. 90 tablet 3   spironolactone  (ALDACTONE ) 25 MG tablet TAKE 1 TABLET(25 MG) BY MOUTH DAILY 90 tablet 2   valsartan  (DIOVAN ) 160 MG tablet Take 1 tablet (160 mg total) by mouth daily. (Patient taking differently: Take 80 mg by mouth daily.) 90 tablet 3   No current facility-administered medications for this visit.   Facility-Administered Medications Ordered in Other Visits  Medication Dose Route Frequency Provider Last Rate Last Admin   sodium chloride  flush (NS) 0.9 % injection 10 mL  10 mL Intravenous PRN [name], Lisa K, NP   10 mL at 10/24/22 1311    VITAL SIGNS: LMP 09/17/2012  There were no vitals filed for this visit.  Estimated body mass index is 28.11 kg/m as calculated from the following:   Height as of 02/04/24: 5' 8 (1.727 m).   Weight as of 02/04/24: 184 lb 14.4 oz (83.9 kg).   PERFORMANCE STATUS (ECOG) : 1 - Symptomatic but completely ambulatory  IMPRESSION: Discussed the use of AI scribe software for clinical note transcription with the patient, who gave verbal consent to proceed.  History of Present Illness Alyssa Ross is a 58 year old female who I connected by phone with for follow-up. No acute distress. She is doing well overall. Taking things one day at a time.   She was unable to receive her scheduled treatment due to elevated liver enzyme levels. Itching began the night before her liver enzymes were  found to be elevated. Scans showed no abnormalities, and her liver enzyme levels returned to near normal after a week. This has occurred once or twice before. She is hopeful to resume treatment.   She experiences severe itching, particularly at night, and has been prescribed hydroxyzine  (Atarax ) for relief. While it helps, it takes a while to be effective, and she is unsure if the itching subsides on its own or due to the medication. We discussed in addition to use hydrocortisone and benadryl  cream with Eucerin or Aquaphor for moisturizing.   She reports gastrointestinal issues, specifically an increase in oily stools, which she attributes to certain foods. Baked pork chops, which previously did not cause issues, have recently led to oily stools. She avoids dairy and is  cautious with certain vegetables that can upset her stomach. We discussed etiology of pancreatic enzymes. She has not used Creon however will plan to continue monitoring before considering due to cost and additional pill burden.   All questions answered and support provided.   Goals of Care Patient has completed advanced directives naming Alyssa and Alyssa Ross as her medical decision makers in the event she is unable to speak for herself.  No desire for artificial feedings.  No life prolonging measures in the event of a terminal state.  I discussed the importance of continued conversation with family and their medical providers regarding overall plan of care and treatment options, ensuring decisions are within the context of the patients values and GOCs. Assessment & Plan  Intermittent cholestasis with pruritus  Intermittent episodes of elevated liver enzymes and pruritus, likely related to the Whipple procedure. Recent episode resolved spontaneously with normalization of liver enzymes. No concerning findings on scans. Itching is severe, especially at night, and hydroxyzine  provides limited relief. - Recommend use of Benadryl   cream mixed with 1% hydrocortisone cream for pruritus. - Advise mixing the creams with Eucerin or Aquaphor for application to affected areas. - Continue hydroxyzine  as needed for pruritus.  Exocrine pancreatic insufficiency  Increased frequency of oily stools, possibly due to exocrine pancreatic insufficiency. Symptoms may be related to dietary intake, particularly fatty foods. Creon, a pancreatic enzyme replacement, is discussed as a potential treatment but is noted to be expensive. - Monitor dietary intake and symptoms to determine if symptoms improve with dietary adjustments. - Consider Creon if symptoms persist or become bothersome, with the understanding of its cost and insurance coverage variability.  Type 2 diabetes mellitus Blood glucose levels are inconsistent despite stable dietary intake and medication regimen. Variability may be influenced by factors such as stress, activity level, and fatigue. Current management includes insulin  therapy. - Continue current diabetes management plan. - Monitor blood glucose levels and consider lifestyle factors that may influence glucose variability.  Follow-Up Plan to follow up with her to monitor her condition and address any concerns. - Schedule follow-up phone call in 6 to 8 weeks. - Advise contacting the provider if any issues arise before the scheduled follow-up.  Patient expressed understanding and was in agreement with this plan. She also understands that She can call the clinic at any time with any questions, concerns, or complaints.   I provided 25 minutes of non face-to-face telephone visit time during this encounter, and > 50% was spent counseling as documented under my assessment & plan. Visit consisted of counseling and education dealing with the complex and emotionally intense issues of symptom management and palliative care in the setting of serious and potentially life-threatening illness.  Levon Borer, AGPCNP-BC   Palliative Medicine Team/[facility]

## 2024-02-18 ENCOUNTER — Telehealth: Payer: Self-pay | Admitting: Nurse Practitioner

## 2024-02-18 ENCOUNTER — Encounter: Payer: Self-pay | Admitting: Nurse Practitioner

## 2024-02-18 ENCOUNTER — Inpatient Hospital Stay

## 2024-02-18 ENCOUNTER — Inpatient Hospital Stay (HOSPITAL_BASED_OUTPATIENT_CLINIC_OR_DEPARTMENT_OTHER): Admitting: Nurse Practitioner

## 2024-02-18 ENCOUNTER — Other Ambulatory Visit: Payer: Self-pay | Admitting: Nurse Practitioner

## 2024-02-18 VITALS — BP 111/81 | HR 82 | Temp 97.6°F | Resp 16 | Wt 187.2 lb

## 2024-02-18 DIAGNOSIS — N6320 Unspecified lump in the left breast, unspecified quadrant: Secondary | ICD-10-CM | POA: Diagnosis not present

## 2024-02-18 DIAGNOSIS — C7802 Secondary malignant neoplasm of left lung: Secondary | ICD-10-CM | POA: Diagnosis not present

## 2024-02-18 DIAGNOSIS — Z85528 Personal history of other malignant neoplasm of kidney: Secondary | ICD-10-CM | POA: Diagnosis not present

## 2024-02-18 DIAGNOSIS — Z5111 Encounter for antineoplastic chemotherapy: Secondary | ICD-10-CM | POA: Diagnosis not present

## 2024-02-18 DIAGNOSIS — Z905 Acquired absence of kidney: Secondary | ICD-10-CM | POA: Diagnosis not present

## 2024-02-18 DIAGNOSIS — C3411 Malignant neoplasm of upper lobe, right bronchus or lung: Secondary | ICD-10-CM | POA: Diagnosis not present

## 2024-02-18 DIAGNOSIS — C250 Malignant neoplasm of head of pancreas: Secondary | ICD-10-CM

## 2024-02-18 DIAGNOSIS — C7801 Secondary malignant neoplasm of right lung: Secondary | ICD-10-CM | POA: Diagnosis not present

## 2024-02-18 DIAGNOSIS — C3432 Malignant neoplasm of lower lobe, left bronchus or lung: Secondary | ICD-10-CM | POA: Diagnosis not present

## 2024-02-18 DIAGNOSIS — Z7901 Long term (current) use of anticoagulants: Secondary | ICD-10-CM | POA: Diagnosis not present

## 2024-02-18 DIAGNOSIS — Z86718 Personal history of other venous thrombosis and embolism: Secondary | ICD-10-CM | POA: Diagnosis not present

## 2024-02-18 DIAGNOSIS — C259 Malignant neoplasm of pancreas, unspecified: Secondary | ICD-10-CM | POA: Diagnosis not present

## 2024-02-18 LAB — CBC WITH DIFFERENTIAL (CANCER CENTER ONLY)
Abs Immature Granulocytes: 0.02 K/uL (ref 0.00–0.07)
Basophils Absolute: 0 K/uL (ref 0.0–0.1)
Basophils Relative: 1 %
Eosinophils Absolute: 0.2 K/uL (ref 0.0–0.5)
Eosinophils Relative: 4 %
HCT: 35.6 % — ABNORMAL LOW (ref 36.0–46.0)
Hemoglobin: 11.6 g/dL — ABNORMAL LOW (ref 12.0–15.0)
Immature Granulocytes: 0 %
Lymphocytes Relative: 24 %
Lymphs Abs: 1.2 K/uL (ref 0.7–4.0)
MCH: 33.1 pg (ref 26.0–34.0)
MCHC: 32.6 g/dL (ref 30.0–36.0)
MCV: 101.7 fL — ABNORMAL HIGH (ref 80.0–100.0)
Monocytes Absolute: 0.5 K/uL (ref 0.1–1.0)
Monocytes Relative: 11 %
Neutro Abs: 3 K/uL (ref 1.7–7.7)
Neutrophils Relative %: 60 %
Platelet Count: 301 K/uL (ref 150–400)
RBC: 3.5 MIL/uL — ABNORMAL LOW (ref 3.87–5.11)
RDW: 12.5 % (ref 11.5–15.5)
WBC Count: 5 K/uL (ref 4.0–10.5)
nRBC: 0 % (ref 0.0–0.2)

## 2024-02-18 LAB — CMP (CANCER CENTER ONLY)
ALT: 85 U/L — ABNORMAL HIGH (ref 0–44)
AST: 105 U/L — ABNORMAL HIGH (ref 15–41)
Albumin: 3.8 g/dL (ref 3.5–5.0)
Alkaline Phosphatase: 583 U/L — ABNORMAL HIGH (ref 38–126)
Anion gap: 9 (ref 5–15)
BUN: 8 mg/dL (ref 6–20)
CO2: 22 mmol/L (ref 22–32)
Calcium: 9.4 mg/dL (ref 8.9–10.3)
Chloride: 105 mmol/L (ref 98–111)
Creatinine: 0.99 mg/dL (ref 0.44–1.00)
GFR, Estimated: >60 mL/min (ref >60)
Glucose, Bld: 280 mg/dL — ABNORMAL HIGH (ref 70–99)
Potassium: 4.5 mmol/L (ref 3.5–5.1)
Sodium: 136 mmol/L (ref 135–145)
Total Bilirubin: 0.9 mg/dL (ref 0.0–1.2)
Total Protein: 7.5 g/dL (ref 6.5–8.1)

## 2024-02-18 LAB — MAGNESIUM: Magnesium: 1.9 mg/dL (ref 1.7–2.4)

## 2024-02-18 MED ORDER — SODIUM CHLORIDE 0.9 % IV SOLN: INTRAVENOUS | Status: DC

## 2024-02-18 MED ORDER — ATROPINE SULFATE 1 MG/ML IV SOLN
0.5000 mg | Freq: Once | INTRAVENOUS | Status: AC | PRN
  Administered 2024-02-18: 0.5 mg via INTRAVENOUS
  Filled 2024-02-18: qty 1

## 2024-02-18 MED ORDER — PALONOSETRON HCL INJECTION 0.25 MG/5ML
0.2500 mg | Freq: Once | INTRAVENOUS | Status: AC
  Administered 2024-02-18: 0.25 mg via INTRAVENOUS
  Filled 2024-02-18: qty 5

## 2024-02-18 MED ORDER — SODIUM CHLORIDE 0.9 % IV SOLN
2400.0000 mg/m2 | INTRAVENOUS | Status: DC
  Administered 2024-02-18: 5000 mg via INTRAVENOUS
  Filled 2024-02-18: qty 100

## 2024-02-18 MED ORDER — SODIUM CHLORIDE 0.9 % IV SOLN
400.0000 mg/m2 | Freq: Once | INTRAVENOUS | Status: AC
  Administered 2024-02-18: 776 mg via INTRAVENOUS
  Filled 2024-02-18: qty 38.8

## 2024-02-18 MED ORDER — DEXAMETHASONE SODIUM PHOSPHATE 10 MG/ML IJ SOLN
10.0000 mg | Freq: Once | INTRAMUSCULAR | Status: AC
  Administered 2024-02-18: 10 mg via INTRAVENOUS
  Filled 2024-02-18: qty 1

## 2024-02-18 MED ORDER — SODIUM CHLORIDE 0.9 % IV SOLN
55.0000 mg/m2 | Freq: Once | INTRAVENOUS | Status: AC
  Administered 2024-02-18: 107.5 mg via INTRAVENOUS
  Filled 2024-02-18: qty 25

## 2024-02-18 NOTE — Progress Notes (Signed)
 Patient seen by Olam Ned NP today  Vitals are within treatment parameters:Yes   Labs are within treatment parameters: Yes   Treatment plan has been signed: Yes   Per physician team, Patient is ready for treatment and there are NO modifications to the treatment plan.

## 2024-02-18 NOTE — Progress Notes (Signed)
 [facility] OFFICE PROGRESS NOTE   Diagnosis: Pancreas cancer  INTERVAL HISTORY:   Ms. Stroud returns as scheduled.  She completed a cycle of 5-FU/liposomal irinotecan  01/14/2024.  Treatment was held 02/04/2024 due to elevated LFTs.  She underwent CTs which showed no evidence of biliary obstruction.  LFTs were improved when repeated 02/10/2024.  Overall she feels well.  No nausea or vomiting.  No mouth sores.  No diarrhea.  Mild recurrent pruritus arms and hands last night and this morning.  No fever or shaking chills.  Objective:  Vital signs in last 24 hours:  Blood pressure 111/81, pulse 82, temperature 97.6 F (36.4 C), temperature source Temporal, resp. rate 16, weight 187 lb 3.2 oz (84.9 kg), last menstrual period 09/17/2012, SpO2 100%.    HEENT: No thrush or ulcers.  Sclera anicteric. Resp: Lungs clear bilaterally. Cardio: Regular rate and rhythm. GI: No hepatosplenomegaly.  Nontender. Vascular: No leg edema. Neuro: Alert and oriented. Skin: No rash. Port-A-Cath without erythema.  Lab Results:  Lab Results  Component Value Date   WBC 5.0 02/18/2024   HGB 11.6 (L) 02/18/2024   HCT 35.6 (L) 02/18/2024   MCV 101.7 (H) 02/18/2024   PLT 301 02/18/2024   NEUTROABS 3.0 02/18/2024    Imaging:  No results found.  Medications: I have reviewed the patient's current medications.  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 Tumor invades the duodenal wall and vascular groove, resection margins negative, 4/34 lymph nodes positive MSI-stable, tumor showed instability in 2 loci as did adjacent normal tissue Foundation 1-K-ras G12 V, microsatellite status and tumor mutation burden cannot be determined EUS FNA biopsy of pancreas mass on 07/03/2018-well-differentiated neuroendocrine tumor CTs 01/29/2019-ill-defined pancreas head mass, 5 pulmonary nodules-1 with a small amount of central cavitation, tumor abuts  the left margin of the portal vein indistinct density surrounding, hepatic artery, complex cystic lesion of the right kidney, right adrenal mass-characterized as an adenoma on a [facility] MRI 12/21/2018 [facility]  03/03/2019-no focal pancreas activity, no tracer accumulation within the suspicious pulmonary nodules, left uterine mass with tracer accumulation felt to represent a leiomyoma Elevated preoperative CA 19-9--CA 19-9 186 on 01/18/2019 CT chest 04/16/2019-multiple bilateral pulmonary nodules, some with increased cavitation, stable in size Cycle 1 FOLFIRINOX 04/27/2019 Cycle 2 FOLFIRINOX 05/11/2019 Cycle 3 FOLFIRINOX 05/23/2019 Cycle 4 FOLFIRINOX 06/08/2019 Cycle 5 FOLFIRINOX 06/22/2019 CT chest 07/02/2019-stable size of bilateral pulmonary nodules.  Dominant cavitary lesions in both lungs show increased cavitation with thinner walls.  Stable 2.1 cm right adrenal nodule. Cycle 6 FOLFIRINOX 07/06/2019 Cycle 7 FOLFIRINOX 07/21/2019 Cycle 8 FOLFIRINOX 08/03/2019, oxaliplatin  deleted secondary to neuropathy CT chest 08/24/2019-decreased size of several lung nodules with resolution of a left upper lobe nodule, no new nodules Radiation to the pancreas surgical area with concurrent Xeloda  09/13/2019-10/20/2019  CTs 11/29/2019-multiple small pulmonary nodules scattered throughout the lungs bilaterally, appear increased in number and size. No definite evidence of metastatic disease in the abdomen or pelvis. Markedly enlarged and heterogeneous appearing uterus, likely to represent multifocal fibroids. 1 of these lesions appears to be an exophytic subserosal fibroid in the posterior lateral aspect of the uterine body on the left side although this comes in close proximity to the left adnexa such that a primary ovarian lesion is difficult to completely exclude. CTs 02/07/2020-slight enlargement of bilateral lung nodules, some are cavitary, no evidence of metastatic disease in the abdomen or pelvis, stable right adrenal nodule,  uterine fibroids CTs 04/26/2020-mild enlargement of pulmonary nodules, slight increase in trace  pelvic fluid, new soft tissue thickening inferior to the cecal tip suspicious for peritoneal metastasis CT 05/26/2020-improved appearance of soft tissue at the inferior tip of the cecum, mildly thickened short appendix-findings suggestive of resolving appendicitis, stable small bibasilar pulmonary nodules, fibroids Plan biopsy of right cecal tip soft tissue canceled secondary to radiologic improvement CT chest 08/02/2020-enlargement and progressive cavitation of multiple bilateral lung nodules.  Some new nodules are present. CTs 10/24/2020- increase in size of pulmonary nodules, no new nodules, no evidence of metastatic disease in the abdomen, stable right adrenal nodule CT 01/09/2021-slight interval enlargement of pulmonary nodules, stable right adrenal nodule Navigation bronchoscopy 01/30/2021-left lower lobe cavitary nodule FNA-adenocarcinoma, brushing-adenocarcinoma.  Left lower lobe lavage-adenocarcinoma.  Right upper lobe brushing and FNA biopsy of cavitary nodule-adenocarcinoma-immunohistochemical profile consistent with pancreas adenocarcinoma Cycle 1 gemcitabine /Abraxane  03/28/2021 Cycle 2 gemcitabine /Abraxane  04/11/2021 Cycle 3 gemcitabine /Abraxane  04/25/2021 Cycle 4 gemcitabine /Abraxane  05/09/2021 Cycle 5 gemcitabine /Abraxane  05/23/2021 CT chest 06/05/2021-interval cavitation of multiple pulmonary nodules, some nodules have decreased in size, no new or enlarging nodules Cycle 6 gemcitabine /Abraxane  06/06/2021 Cycle 7 gemcitabine /Abraxane  06/21/2021 Cycle 8 gemcitabine /Abraxane  07/05/2021 Cycle 9 Gemcitabine /Abraxane  07/19/2021 Cycle 10 gemcitabine  08/01/2021-Abraxane  held secondary to neuropathy CT chest 08/13/2021-mild decrease in size and wall thickness of multiple cavitary nodules, no new or progressive disease in the chest, indeterminate low-attenuation right liver lesions Cycle 11 gemcitabine   08/16/2021-Abraxane  held secondary to neuropathy Cycle 12 gemcitabine  08/30/2021-Abraxane  held secondary to neuropathy Cycle 13 gemcitabine  09/13/2021-Abraxane  held secondary to neuropathy Cycle 14 gemcitabine  09/27/2021-Abraxane  held secondary to neuropathy Cycle 15 gemcitabine  10/11/2021-Abraxane  held secondary to neuropathy CTs 10/22/2021-no change in multiple cavitary lung nodules, no evidence of disease progression, ill-defined hypodense lesion in the posterior right liver suspicious for metastatic disease Cycle 16 gemcitabine  10/25/2021 Cycle 17 gemcitabine  11/08/2021 Cycle 18 Gemcitabine  11/22/2021 Cycle 19 gemcitabine  12/06/2021 Cycle 20 Gemcitabine  12/20/2021 CT 12/31/2021-mild increase in size of bilateral pulmonary metastases, stable subtle continuation right liver lesions Cycle 20 gemcitabine /Abraxane  01/03/2022 Cycle 21 gemcitabine /Abraxane  01/17/2022 Cycle 22 gemcitabine /Abraxane  01/31/2022 Cycle 23 gemcitabine /Abraxane  02/14/2022 Cycle 24 gemcitabine /Abraxane  02/28/2022 CTs 03/11/2022-widespread metastatic disease to the lungs again noted with slight involution of several of the pulmonary nodules, no definite new nodules noted; interval cavitation of solid lesion in the posterior aspect right lobe of the liver, no new liver lesions noted. Cycle 25 Gemcitabine /Abraxane  03/14/2022 Cycle 26 gemcitabine /Abraxane  03/28/2022 Cycle 27 gemcitabine /Abraxane  04/25/2022 Cycle 28 gemcitabine /Abraxane  05/09/2022 Cycle 29 Gemcitabine  05/23/2022, Abraxane  held due to neuropathy CTs 06/04/2022-stable multifocal cavitary pulmonary nodules.  No definite new nodules.  No new nodules greater than a centimeter.  Decreased size of the right posterior hepatic lobe lesion.  Generalized heterogeneity and new focal areas of hypodensity in the liver. Cycle 30 Gemcitabine  06/06/2022, Abraxane  held due to neuropathy Cycle 31 gemcitabine  06/20/2022, Abraxane  held due to neuropathy Cycle 32 gemcitabine  07/04/2022, Abraxane  held due  to neuropathy Cycle 33 gemcitabine   07/18/2022 ,Abraxane  held due to neuropathy Cycle 34 gemcitabine  08/15/2022, Abraxane  held due to neuropathy Cycle 35 gemcitabine  08/29/2022, Abraxane  held due to neuropathy CTs 09/10/2022-enlarging pulmonary metastases.  Subtle poorly defined hypoattenuating lesions in the liver, less well-seen than on 06/04/2022.  Suspected new lesion in the dome of the right hepatic lobe Cycle 36 gemcitabine /Abraxane  09/12/2022 Cycle 37 gemcitabine /Abraxane  09/26/2022 Cycle 38 gemcitabine /Abraxane  10/10/2022 Cycle 39 gemcitabine /Abraxane  11/07/2022 Cycle 40 gemcitabine /Abraxane  11/21/2022 Cycle 41 gemcitabine /Abraxane  12/05/2022 Cycle 42 gemcitabine /Abraxane  12/19/2022 CT is 12/30/2022-new left hepatic lesion, stable bilateral pulmonary metastases Cycle 43 gemcitabine /Abraxane  01/02/2023 CT-guided biopsy of liver lesion 01/27/2023-acute/chronic inflammation, negative for malignancy, culture negative Cycle 44 gemcitabine /Abraxane  01/30/2023 Cycle 45  gemcitabine /Abraxane  02/13/2023 Cycle 46 gemcitabine /Abraxane  02/27/2023 Cycle 47 gemcitabine /Abraxane  03/13/2023 Cycle 48 gemcitabine /Abraxane  03/27/2023 CTs 04/21/2023-numerous solid and cavitary lung nodules overall similar, some slightly enlarged; multiple new and enlarging rim-enhancing liver lesions. Cycle 1 FOLFIRI with liposomal irinotecan  05/21/2023 Cycle 2 held 06/05/2023 due to neutropenia Cycle 2 FOLFIRI with liposomal irinotecan  06/18/2023, irinotecan  dose reduced (white cell growth factor declined per patient) Cycle 3 FOLFIRI with liposomal irinotecan  07/02/2023 Cycle 4 FOLFIRI with liposomal irinotecan  07/16/2023 Cycle 5 FOLFIRI with liposomal irinotecan  07/30/2023 Cycle 6 FOLFIRI with liposomal irinotecan  08/13/2023 CTs 08/20/2023-numerous cavitary lesions seen in the lungs, extent and distribution appear similar.  A few lesions are slightly smaller.  Numerous liver lesions are now smaller.  Stable right adrenal nodule, adenoma  favored. Cycle 7 FOLFIRI with liposomal irinotecan  08/27/2023 Cycle 8 FOLFIRI with liposomal irinotecan  09/11/2023 Cycle 9 FOLFIRI with liposomal irinotecan  09/24/2023 Cycle 10 FOLFIRI with liposomal irinotecan  10/08/2023 Cycle 11 FOLFIRI with liposomal irinotecan  10/22/2023 Cycle 12 FOLFIRI with liposomal irinotecan  11/05/2023 CT 11/17/2023-majority of multiple irregular thick-walled cavitary lesions throughout bilateral lungs grossly unchanged or slightly decreased in size.  Redemonstration of peripheral wedge-shaped ill-defined hypoattenuating areas in the liver suggesting treatment response.  No discrete new suspicious liver lesions seen.  No new metastatic disease. Cycle 13 FOLFIRI with liposomal irinotecan  11/19/2023 Cycle 14 FOLFIRI with liposomal irinotecan  12/03/2023 Cycle 15 FOLFIRI with liposomal irinotecan  12/24/2023 Cycle 16 FOLFIRI with liposomal irinotecan  01/14/2024 02/04/2024 treatment held due to elevated LFTs, referred for CTs CTs 02/04/2024-liver lesions less conspicuous.  No significant new lesions identified.  Innumerable scattered cavitary lung nodules bilaterally.  Most are similar but a few demonstrate reduced thickening of the wall or reduced surrounding inflammatory stranding along the margins.  No substantial new nodularity observed. 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  Apixaban  10/24/2022 Port-A-Cath malfunction 08/01/2023.  Port-A-Cath removed and replaced 08/08/2023.    Disposition: Ms. Hendel appears stable.  She has completed 16 cycles of FOLFIRI with liposomal irinotecan .  Treatment was held 02/04/2024 due to elevated LFTs.  CT scans were completed with no explanation for the abnormal liver tests.  Repeat chemistry panel 02/10/2024 showed improvement.  We reviewed the chemistry panel from today.  AST/ALT again mildly elevated, bilirubin is normal.  Etiology remains unclear.  Plan to proceed with FOLFIRI with liposomal irinotecan  today as scheduled.  Plan for GI referral if LFTs become significantly elevated.  She agrees with this plan.  Treatment is currently on a 3-week schedule.  She requests follow-up and treatment in 4 weeks.  We will see her in follow-up on 03/17/2024.  We are available to see her sooner if needed.  Patient seen with Dr. Cloretta.  Olam Ned ANP/GNP-BC   02/18/2024  8:56 AM  This was a shared visit with Olam Ned.  Ms. Meggison continues treatment with 5-FU/liposomal irinotecan .  CTs on 02/04/2024 revealed  no evidence of disease progression.  The liver enzymes were elevated 02/04/2024.  Review of the CT images at the GI tumor conference revealed no evidence of biliary obstruction.  The liver enzymes remain mildly elevated.  The etiology of the elevation of the transaminases and alkaline phosphatase is unclear.  This could be related to an effect of chemotherapy, early biliary obstruction, or metastatic disease involving the liver.  The plan is to continue 5-FU/liposomal irinotecan .  We will refer her to Dr. Rollin if the liver enzymes remain elevated.  Arvella Hof, MD

## 2024-02-18 NOTE — Telephone Encounter (Signed)
 Scheduled appointment per 9/9 los. Talked with the patient and she is aware of the made appointment.

## 2024-02-19 DIAGNOSIS — C259 Malignant neoplasm of pancreas, unspecified: Secondary | ICD-10-CM | POA: Diagnosis not present

## 2024-02-19 LAB — CANCER ANTIGEN 19-9: CA 19-9: 116 U/mL — ABNORMAL HIGH (ref 0–35)

## 2024-02-20 ENCOUNTER — Inpatient Hospital Stay

## 2024-02-20 NOTE — Patient Instructions (Signed)

## 2024-02-23 ENCOUNTER — Other Ambulatory Visit

## 2024-02-23 ENCOUNTER — Ambulatory Visit

## 2024-02-23 ENCOUNTER — Ambulatory Visit: Admitting: Oncology

## 2024-02-25 ENCOUNTER — Encounter

## 2024-03-09 DIAGNOSIS — E109 Type 1 diabetes mellitus without complications: Secondary | ICD-10-CM | POA: Diagnosis not present

## 2024-03-12 ENCOUNTER — Other Ambulatory Visit: Payer: Self-pay | Admitting: Oncology

## 2024-03-16 MED ORDER — SODIUM CHLORIDE 0.9 % IV SOLN
2400.0000 mg/m2 | INTRAVENOUS | Status: AC
  Administered 2024-03-17: 5000 mg via INTRAVENOUS
  Filled 2024-03-16: qty 100

## 2024-03-17 ENCOUNTER — Inpatient Hospital Stay: Admitting: Oncology

## 2024-03-17 ENCOUNTER — Inpatient Hospital Stay: Attending: Genetic Counselor

## 2024-03-17 ENCOUNTER — Inpatient Hospital Stay

## 2024-03-17 VITALS — BP 119/78 | HR 95 | Temp 97.8°F | Resp 18 | Ht 68.0 in | Wt 187.6 lb

## 2024-03-17 VITALS — BP 121/76 | HR 55 | Resp 18

## 2024-03-17 DIAGNOSIS — Z5111 Encounter for antineoplastic chemotherapy: Secondary | ICD-10-CM | POA: Insufficient documentation

## 2024-03-17 DIAGNOSIS — C3411 Malignant neoplasm of upper lobe, right bronchus or lung: Secondary | ICD-10-CM | POA: Insufficient documentation

## 2024-03-17 DIAGNOSIS — Z23 Encounter for immunization: Secondary | ICD-10-CM | POA: Diagnosis not present

## 2024-03-17 DIAGNOSIS — C250 Malignant neoplasm of head of pancreas: Secondary | ICD-10-CM

## 2024-03-17 DIAGNOSIS — Z79899 Other long term (current) drug therapy: Secondary | ICD-10-CM | POA: Insufficient documentation

## 2024-03-17 DIAGNOSIS — C3432 Malignant neoplasm of lower lobe, left bronchus or lung: Secondary | ICD-10-CM | POA: Diagnosis not present

## 2024-03-17 DIAGNOSIS — C259 Malignant neoplasm of pancreas, unspecified: Secondary | ICD-10-CM | POA: Diagnosis not present

## 2024-03-17 LAB — CMP (CANCER CENTER ONLY)
ALT: 104 U/L — ABNORMAL HIGH (ref 0–44)
AST: 72 U/L — ABNORMAL HIGH (ref 15–41)
Albumin: 3.6 g/dL (ref 3.5–5.0)
Alkaline Phosphatase: 539 U/L — ABNORMAL HIGH (ref 38–126)
Anion gap: 10 (ref 5–15)
BUN: 9 mg/dL (ref 6–20)
CO2: 24 mmol/L (ref 22–32)
Calcium: 9 mg/dL (ref 8.9–10.3)
Chloride: 102 mmol/L (ref 98–111)
Creatinine: 0.86 mg/dL (ref 0.44–1.00)
GFR, Estimated: >60 mL/min (ref >60)
Glucose, Bld: 317 mg/dL — ABNORMAL HIGH (ref 70–99)
Potassium: 3.9 mmol/L (ref 3.5–5.1)
Sodium: 136 mmol/L (ref 135–145)
Total Bilirubin: 0.9 mg/dL (ref 0.0–1.2)
Total Protein: 7.2 g/dL (ref 6.5–8.1)

## 2024-03-17 LAB — MAGNESIUM: Magnesium: 1.8 mg/dL (ref 1.7–2.4)

## 2024-03-17 LAB — CBC WITH DIFFERENTIAL (CANCER CENTER ONLY)
Abs Immature Granulocytes: 0.01 K/uL (ref 0.00–0.07)
Basophils Absolute: 0 K/uL (ref 0.0–0.1)
Basophils Relative: 0 %
Eosinophils Absolute: 0.1 K/uL (ref 0.0–0.5)
Eosinophils Relative: 3 %
HCT: 34.4 % — ABNORMAL LOW (ref 36.0–46.0)
Hemoglobin: 11.4 g/dL — ABNORMAL LOW (ref 12.0–15.0)
Immature Granulocytes: 0 %
Lymphocytes Relative: 24 %
Lymphs Abs: 1.1 K/uL (ref 0.7–4.0)
MCH: 32.9 pg (ref 26.0–34.0)
MCHC: 33.1 g/dL (ref 30.0–36.0)
MCV: 99.4 fL (ref 80.0–100.0)
Monocytes Absolute: 0.5 K/uL (ref 0.1–1.0)
Monocytes Relative: 11 %
Neutro Abs: 2.7 K/uL (ref 1.7–7.7)
Neutrophils Relative %: 62 %
Platelet Count: 271 K/uL (ref 150–400)
RBC: 3.46 MIL/uL — ABNORMAL LOW (ref 3.87–5.11)
RDW: 12.4 % (ref 11.5–15.5)
WBC Count: 4.5 K/uL (ref 4.0–10.5)
nRBC: 0 % (ref 0.0–0.2)

## 2024-03-17 MED ORDER — SODIUM CHLORIDE 0.9 % IV SOLN
400.0000 mg/m2 | Freq: Once | INTRAVENOUS | Status: AC
  Administered 2024-03-17: 776 mg via INTRAVENOUS
  Filled 2024-03-17: qty 38.8

## 2024-03-17 MED ORDER — ATROPINE SULFATE 1 MG/ML IV SOLN
0.5000 mg | Freq: Once | INTRAVENOUS | Status: AC | PRN
  Administered 2024-03-17: 0.5 mg via INTRAVENOUS
  Filled 2024-03-17: qty 1

## 2024-03-17 MED ORDER — SODIUM CHLORIDE 0.9 % IV SOLN: INTRAVENOUS | Status: DC

## 2024-03-17 MED ORDER — DEXAMETHASONE SODIUM PHOSPHATE 10 MG/ML IJ SOLN
10.0000 mg | Freq: Once | INTRAMUSCULAR | Status: AC
  Administered 2024-03-17: 10 mg via INTRAVENOUS
  Filled 2024-03-17: qty 1

## 2024-03-17 MED ORDER — PALONOSETRON HCL INJECTION 0.25 MG/5ML
0.2500 mg | Freq: Once | INTRAVENOUS | Status: AC
  Administered 2024-03-17: 0.25 mg via INTRAVENOUS
  Filled 2024-03-17: qty 5

## 2024-03-17 MED ORDER — SODIUM CHLORIDE 0.9 % IV SOLN
55.0000 mg/m2 | Freq: Once | INTRAVENOUS | Status: AC
  Administered 2024-03-17: 107.5 mg via INTRAVENOUS
  Filled 2024-03-17: qty 25

## 2024-03-17 NOTE — Progress Notes (Signed)
 Patient seen by Dr. Arley Hof today  Vitals are within treatment parameters:Yes   Labs are within treatment parameters: No (Please specify and give further instructions.)  OK to treat w/ALT 104 and elevated glucose of 317  Treatment plan has been signed: Yes   Per physician team, Patient is ready for treatment and there are NO modifications to the treatment plan.

## 2024-03-17 NOTE — Patient Instructions (Signed)

## 2024-03-17 NOTE — Patient Instructions (Signed)
 [facility] - A DEPT OF [facility]  Discharge Instructions: Thank you for choosing [facility] to provide your oncology and hematology care.   If you have a lab appointment with the Cancer Center, please go directly to the Cancer Center and check in at the registration area.   Wear comfortable clothing and clothing appropriate for easy access to any Portacath or PICC line.   We strive to give you quality time with your provider. You may need to reschedule your appointment if you arrive late (15 or more minutes).  Arriving late affects you and other patients whose appointments are after yours.  Also, if you miss three or more appointments without notifying the office, you may be dismissed from the clinic at the provider's discretion.      For prescription refill requests, have your pharmacy contact our office and allow 72 hours for refills to be completed.    Today you received the following chemotherapy and/or immunotherapy agents: irinotecan , leucovorin , fluorouracil .  Fluorouracil  Injection What is this medication? FLUOROURACIL  (flure oh YOOR a sil) treats some types of cancer. It works by slowing down the growth of cancer cells. This medicine may be used for other purposes; ask your health care provider or pharmacist if you have questions. COMMON BRAND NAME(S): Adrucil  What should I tell my care team before I take this medication? They need to know if you have any of these conditions: Blood disorders Dihydropyrimidine dehydrogenase (DPD) deficiency Infection, such as chickenpox, cold sores, herpes Kidney disease Liver disease Poor nutrition Recent or ongoing radiation therapy An unusual or allergic reaction to fluorouracil , other medications, foods, dyes, or preservatives If you or your partner are pregnant or trying to get pregnant Breast-feeding How should I use this medication? This medication is injected into a vein. It is  administered by your care team in a hospital or clinic setting. Talk to your care team about the use of this medication in children. Special care may be needed. Overdosage: If you think you have taken too much of this medicine contact a poison control center or emergency room at once. NOTE: This medicine is only for you. Do not share this medicine with others. What if I miss a dose? Keep appointments for follow-up doses. It is important not to miss your dose. Call your care team if you are unable to keep an appointment. What may interact with this medication? Do not take this medication with any of the following: Live virus vaccines This medication may also interact with the following: Medications that treat or prevent blood clots, such as warfarin, enoxaparin , dalteparin This list may not describe all possible interactions. Give your health care provider a list of all the medicines, herbs, non-prescription drugs, or dietary supplements you use. Also tell them if you smoke, drink alcohol, or use illegal drugs. Some items may interact with your medicine. What should I watch for while using this medication? Your condition will be monitored carefully while you are receiving this medication. This medication may make you feel generally unwell. This is not uncommon as chemotherapy can affect healthy cells as well as cancer cells. Report any side effects. Continue your course of treatment even though you feel ill unless your care team tells you to stop. In some cases, you may be given additional medications to help with side effects. Follow all directions for their use. This medication may increase your risk of getting an infection. Call your care team for advice  if you get a fever, chills, sore throat, or other symptoms of a cold or flu. Do not treat yourself. Try to avoid being around people who are sick. This medication may increase your risk to bruise or bleed. Call your care team if you notice any  unusual bleeding. Be careful brushing or flossing your teeth or using a toothpick because you may get an infection or bleed more easily. If you have any dental work done, tell your dentist you are receiving this medication. Avoid taking medications that contain aspirin, acetaminophen , ibuprofen, naproxen, or ketoprofen unless instructed by your care team. These medications may hide a fever. Do not treat diarrhea with over the counter products. Contact your care team if you have diarrhea that lasts more than 2 days or if it is severe and watery. This medication can make you more sensitive to the sun. Keep out of the sun. If you cannot avoid being in the sun, wear protective clothing and sunscreen. Do not use sun lamps, tanning beds, or tanning booths. Talk to your care team if you or your partner wish to become pregnant or think you might be pregnant. This medication can cause serious birth defects if taken during pregnancy and for 3 months after the last dose. A reliable form of contraception is recommended while taking this medication and for 3 months after the last dose. Talk to your care team about effective forms of contraception. Do not father a child while taking this medication and for 3 months after the last dose. Use a condom while having sex during this time period. Do not breastfeed while taking this medication. This medication may cause infertility. Talk to your care team if you are concerned about your fertility. What side effects may I notice from receiving this medication? Side effects that you should report to your care team as soon as possible: Allergic reactions--skin rash, itching, hives, swelling of the face, lips, tongue, or throat Heart attack--pain or tightness in the chest, shoulders, arms, or jaw, nausea, shortness of breath, cold or clammy skin, feeling faint or lightheaded Heart failure--shortness of breath, swelling of the ankles, feet, or hands, sudden weight gain, unusual  weakness or fatigue Heart rhythm changes--fast or irregular heartbeat, dizziness, feeling faint or lightheaded, chest pain, trouble breathing High ammonia level--unusual weakness or fatigue, confusion, loss of appetite, nausea, vomiting, seizures Infection--fever, chills, cough, sore throat, wounds that don't heal, pain or trouble when passing urine, general feeling of discomfort or being unwell Low red blood cell level--unusual weakness or fatigue, dizziness, headache, trouble breathing Pain, tingling, or numbness in the hands or feet, muscle weakness, change in vision, confusion or trouble speaking, loss of balance or coordination, trouble walking, seizures Redness, swelling, and blistering of the skin over hands and feet Severe or prolonged diarrhea Unusual bruising or bleeding Side effects that usually do not require medical attention (report to your care team if they continue or are bothersome): Dry skin Headache Increased tears Nausea Pain, redness, or swelling with sores inside the mouth or throat Sensitivity to light Vomiting This list may not describe all possible side effects. Call your doctor for medical advice about side effects. You may report side effects to FDA at 1-800-FDA-1088. Where should I keep my medication? This medication is given in a hospital or clinic. It will not be stored at home. NOTE: This sheet is a summary. It may not cover all possible information. If you have questions about this medicine, talk to your doctor, pharmacist, or health care  provider.  2024 Elsevier/Gold Standard (2021-10-02 00:00:00)  Leucovorin  Injection What is this medication? LEUCOVORIN  (loo koe VOR in) prevents side effects from certain medications, such as methotrexate. It works by increasing folate levels. This helps protect healthy cells in your body. It may also be used to treat anemia caused by low levels of folate. It can also be used with fluorouracil , a type of chemotherapy, to  treat colorectal cancer. It works by increasing the effects of fluorouracil  in the body. This medicine may be used for other purposes; ask your health care provider or pharmacist if you have questions. What should I tell my care team before I take this medication? They need to know if you have any of these conditions: Anemia from low levels of vitamin B12 in the blood An unusual or allergic reaction to leucovorin , folic acid, other medications, foods, dyes, or preservatives Pregnant or trying to get pregnant Breastfeeding How should I use this medication? This medication is injected into a vein or a muscle. It is given by your care team in a hospital or clinic setting. Talk to your care team about the use of this medication in children. Special care may be needed. Overdosage: If you think you have taken too much of this medicine contact a poison control center or emergency room at once. NOTE: This medicine is only for you. Do not share this medicine with others. What if I miss a dose? Keep appointments for follow-up doses. It is important not to miss your dose. Call your care team if you are unable to keep an appointment. What may interact with this medication? Capecitabine  Fluorouracil  Phenobarbital Phenytoin Primidone Trimethoprim;sulfamethoxazole This list may not describe all possible interactions. Give your health care provider a list of all the medicines, herbs, non-prescription drugs, or dietary supplements you use. Also tell them if you smoke, drink alcohol, or use illegal drugs. Some items may interact with your medicine. What should I watch for while using this medication? Your condition will be monitored carefully while you are receiving this medication. This medication may increase the side effects of 5-fluorouracil . Tell your care team if you have diarrhea or mouth sores that do not get better or that get worse. What side effects may I notice from receiving this  medication? Side effects that you should report to your care team as soon as possible: Allergic reactions--skin rash, itching, hives, swelling of the face, lips, tongue, or throat This list may not describe all possible side effects. Call your doctor for medical advice about side effects. You may report side effects to FDA at 1-800-FDA-1088. Where should I keep my medication? This medication is given in a hospital or clinic. It will not be stored at home. NOTE: This sheet is a summary. It may not cover all possible information. If you have questions about this medicine, talk to your doctor, pharmacist, or health care provider.  2024 Elsevier/Gold Standard (2021-10-30 00:00:00)  Irinotecan  Injection What is this medication? IRINOTECAN  (ir in oh TEE kan) treats some types of cancer. It works by slowing down the growth of cancer cells. This medicine may be used for other purposes; ask your health care provider or pharmacist if you have questions. COMMON BRAND NAME(S): Camptosar  What should I tell my care team before I take this medication? They need to know if you have any of these conditions: Dehydration Diarrhea Infection, especially a viral infection, such as chickenpox, cold sores, herpes Liver disease Low blood cell levels (white cells, red cells, and platelets)  Low levels of electrolytes, such as calcium , magnesium , or potassium in your blood Recent or ongoing radiation An unusual or allergic reaction to irinotecan , other medications, foods, dyes, or preservatives If you or your partner are pregnant or trying to get pregnant Breast-feeding How should I use this medication? This medication is injected into a vein. It is given by your care team in a hospital or clinic setting. Talk to your care team about the use of this medication in children. Special care may be needed. Overdosage: If you think you have taken too much of this medicine contact a poison control center or emergency room  at once. NOTE: This medicine is only for you. Do not share this medicine with others. What if I miss a dose? Keep appointments for follow-up doses. It is important not to miss your dose. Call your care team if you are unable to keep an appointment. What may interact with this medication? Do not take this medication with any of the following: Cobicistat Itraconazole This medication may also interact with the following: Certain antibiotics, such as clarithromycin, rifampin, rifabutin Certain antivirals for HIV or AIDS Certain medications for fungal infections, such as ketoconazole, posaconazole, voriconazole Certain medications for seizures, such as carbamazepine, phenobarbital, phenytoin Gemfibrozil Nefazodone St. John's wort This list may not describe all possible interactions. Give your health care provider a list of all the medicines, herbs, non-prescription drugs, or dietary supplements you use. Also tell them if you smoke, drink alcohol, or use illegal drugs. Some items may interact with your medicine. What should I watch for while using this medication? Your condition will be monitored carefully while you are receiving this medication. You may need blood work while taking this medication. This medication may make you feel generally unwell. This is not uncommon as chemotherapy can affect healthy cells as well as cancer cells. Report any side effects. Continue your course of treatment even though you feel ill unless your care team tells you to stop. This medication can cause serious side effects. To reduce the risk, your care team may give you other medications to take before receiving this one. Be sure to follow the directions from your care team. This medication may affect your coordination, reaction time, or judgement. Do not drive or operate machinery until you know how this medication affects you. Sit up or stand slowly to reduce the risk of dizzy or fainting spells. Drinking alcohol  with this medication can increase the risk of these side effects. This medication may increase your risk of getting an infection. Call your care team for advice if you get a fever, chills, sore throat, or other symptoms of a cold or flu. Do not treat yourself. Try to avoid being around people who are sick. Avoid taking medications that contain aspirin, acetaminophen , ibuprofen, naproxen, or ketoprofen unless instructed by your care team. These medications may hide a fever. This medication may increase your risk to bruise or bleed. Call your care team if you notice any unusual bleeding. Be careful brushing or flossing your teeth or using a toothpick because you may get an infection or bleed more easily. If you have any dental work done, tell your dentist you are receiving this medication. Talk to your care team if you or your partner are pregnant or think either of you might be pregnant. This medication can cause serious birth defects if taken during pregnancy and for 6 months after the last dose. You will need a negative pregnancy test before starting this medication.  Contraception is recommended while taking this medication and for 6 months after the last dose. Your care team can help you find the option that works for you. Do not father a child while taking this medication and for 3 months after the last dose. Use a condom for contraception during this time period. Do not breastfeed while taking this medication and for 7 days after the last dose. This medication may cause infertility. Talk to your care team if you are concerned about your fertility. What side effects may I notice from receiving this medication? Side effects that you should report to your care team as soon as possible: Allergic reactions--skin rash, itching, hives, swelling of the face, lips, tongue, or throat Dry cough, shortness of breath or trouble breathing Increased saliva or tears, increased sweating, stomach cramping, diarrhea,  small pupils, unusual weakness or fatigue, slow heartbeat Infection--fever, chills, cough, sore throat, wounds that don't heal, pain or trouble when passing urine, general feeling of discomfort or being unwell Kidney injury--decrease in the amount of urine, swelling of the ankles, hands, or feet Low red blood cell level--unusual weakness or fatigue, dizziness, headache, trouble breathing Severe or prolonged diarrhea Unusual bruising or bleeding Side effects that usually do not require medical attention (report to your care team if they continue or are bothersome): Constipation Diarrhea Hair loss Loss of appetite Nausea Stomach pain This list may not describe all possible side effects. Call your doctor for medical advice about side effects. You may report side effects to FDA at 1-800-FDA-1088. Where should I keep my medication? This medication is given in a hospital or clinic. It will not be stored at home. NOTE: This sheet is a summary. It may not cover all possible information. If you have questions about this medicine, talk to your doctor, pharmacist, or health care provider.  2024 Elsevier/Gold Standard (2021-10-08 00:00:00)      To help prevent nausea and vomiting after your treatment, we encourage you to take your nausea medication as directed.  BELOW ARE SYMPTOMS THAT SHOULD BE REPORTED IMMEDIATELY: *FEVER GREATER THAN 100.4 F (38 C) OR HIGHER *CHILLS OR SWEATING *NAUSEA AND VOMITING THAT IS NOT CONTROLLED WITH YOUR NAUSEA MEDICATION *UNUSUAL SHORTNESS OF BREATH *UNUSUAL BRUISING OR BLEEDING *URINARY PROBLEMS (pain or burning when urinating, or frequent urination) *BOWEL PROBLEMS (unusual diarrhea, constipation, pain near the anus) TENDERNESS IN MOUTH AND THROAT WITH OR WITHOUT PRESENCE OF ULCERS (sore throat, sores in mouth, or a toothache) UNUSUAL RASH, SWELLING OR PAIN  UNUSUAL VAGINAL DISCHARGE OR ITCHING   Items with * indicate a potential emergency and should be  followed up as soon as possible or go to the Emergency Department if any problems should occur.  Please show the CHEMOTHERAPY ALERT CARD or IMMUNOTHERAPY ALERT CARD at check-in to the Emergency Department and triage nurse.  Should you have questions after your visit or need to cancel or reschedule your appointment, please contact [facility] - A DEPT OF [facility]  Dept: [phone]  and follow the prompts.  Office hours are 8:00 a.m. to 4:30 p.m. Monday - Friday. Please note that voicemails left after 4:00 p.m. may not be returned until the following business day.  We are closed weekends and major holidays. You have access to a nurse at all times for urgent questions. Please call the main number to the clinic Dept: [phone] and follow the prompts.   For any non-urgent questions, you may also contact your provider using MyChart. We now  offer e-Visits for anyone 47 and older to request care online for non-urgent symptoms. For details visit [email or web address].   Also download the MyChart app! Go to the app store, search MyChart, open the app, select [facility], and log in with your MyChart username and password.

## 2024-03-17 NOTE — Progress Notes (Signed)
 [facility] OFFICE PROGRESS NOTE   Diagnosis: Pancreas cancer  INTERVAL HISTORY:   [name] completed another cycle of 5-FU/liposomal irinotecan  on 02/18/2024.  She has occasional nausea.  No diarrhea.  No mouth sores.  She is working.  Objective:  Vital signs in last 24 hours:  Blood pressure 119/78, pulse 95, temperature 97.8 F (36.6 C), resp. rate 18, height 5' 8 (1.727 m), weight 187 lb 9.6 oz (85.1 kg), last menstrual period 09/17/2012, SpO2 100%.    HEENT: No thrush or ulcer Resp: Lungs clear bilaterally Cardio: Regular rate and rhythm GI: No hepatosplenomegaly, no mass, nontender Vascular: No leg edema  Skin: Palms without erythema  Portacath/PICC-without erythema  Lab Results:  Lab Results  Component Value Date   WBC 4.5 03/17/2024   HGB 11.4 (L) 03/17/2024   HCT 34.4 (L) 03/17/2024   MCV 99.4 03/17/2024   PLT 271 03/17/2024   NEUTROABS 2.7 03/17/2024    CMP  Lab Results  Component Value Date   NA 136 02/18/2024   K 4.5 02/18/2024   CL 105 02/18/2024   CO2 22 02/18/2024   GLUCOSE 280 (H) 02/18/2024   BUN 8 02/18/2024   CREATININE 0.99 02/18/2024   CALCIUM  9.4 02/18/2024   PROT 7.5 02/18/2024   ALBUMIN  3.8 02/18/2024   AST 105 (H) 02/18/2024   ALT 85 (H) 02/18/2024   ALKPHOS 583 (H) 02/18/2024   BILITOT 0.9 02/18/2024   GFRNONAA >60 02/18/2024   GFRAA >60 02/07/2020    Lab Results  Component Value Date   RJW800 116 (H) 02/18/2024     Medications: I have reviewed the patient's current medications.   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 Tumor invades the duodenal wall and vascular groove, resection margins negative, 4/34 lymph nodes positive MSI-stable, tumor showed instability in 2 loci as did adjacent normal tissue Foundation 1-K-ras G12 V, microsatellite status and tumor mutation burden cannot be determined EUS FNA biopsy of pancreas mass on  07/03/2018-well-differentiated neuroendocrine tumor CTs 01/29/2019-ill-defined pancreas head mass, 5 pulmonary nodules-1 with a small amount of central cavitation, tumor abuts the left margin of the portal vein indistinct density surrounding, hepatic artery, complex cystic lesion of the right kidney, right adrenal mass-characterized as an adenoma on a [facility] MRI 12/21/2018 [facility]  03/03/2019-no focal pancreas activity, no tracer accumulation within the suspicious pulmonary nodules, left uterine mass with tracer accumulation felt to represent a leiomyoma Elevated preoperative CA 19-9--CA 19-9 186 on 01/18/2019 CT chest 04/16/2019-multiple bilateral pulmonary nodules, some with increased cavitation, stable in size Cycle 1 FOLFIRINOX 04/27/2019 Cycle 2 FOLFIRINOX 05/11/2019 Cycle 3 FOLFIRINOX 05/23/2019 Cycle 4 FOLFIRINOX 06/08/2019 Cycle 5 FOLFIRINOX 06/22/2019 CT chest 07/02/2019-stable size of bilateral pulmonary nodules.  Dominant cavitary lesions in both lungs show increased cavitation with thinner walls.  Stable 2.1 cm right adrenal nodule. Cycle 6 FOLFIRINOX 07/06/2019 Cycle 7 FOLFIRINOX 07/21/2019 Cycle 8 FOLFIRINOX 08/03/2019, oxaliplatin  deleted secondary to neuropathy CT chest 08/24/2019-decreased size of several lung nodules with resolution of a left upper lobe nodule, no new nodules Radiation to the pancreas surgical area with concurrent Xeloda  09/13/2019-10/20/2019  CTs 11/29/2019-multiple small pulmonary nodules scattered throughout the lungs bilaterally, appear increased in number and size. No definite evidence of metastatic disease in the abdomen or pelvis. Markedly enlarged and heterogeneous appearing uterus, likely to represent multifocal fibroids. 1 of these lesions appears to be an exophytic subserosal fibroid in the posterior lateral aspect of the uterine body on the left side although this comes in close  proximity to the left adnexa such that a primary ovarian lesion is difficult to completely  exclude. CTs 02/07/2020-slight enlargement of bilateral lung nodules, some are cavitary, no evidence of metastatic disease in the abdomen or pelvis, stable right adrenal nodule, uterine fibroids CTs 04/26/2020-mild enlargement of pulmonary nodules, slight increase in trace pelvic fluid, new soft tissue thickening inferior to the cecal tip suspicious for peritoneal metastasis CT 05/26/2020-improved appearance of soft tissue at the inferior tip of the cecum, mildly thickened short appendix-findings suggestive of resolving appendicitis, stable small bibasilar pulmonary nodules, fibroids Plan biopsy of right cecal tip soft tissue canceled secondary to radiologic improvement CT chest 08/02/2020-enlargement and progressive cavitation of multiple bilateral lung nodules.  Some new nodules are present. CTs 10/24/2020- increase in size of pulmonary nodules, no new nodules, no evidence of metastatic disease in the abdomen, stable right adrenal nodule CT 01/09/2021-slight interval enlargement of pulmonary nodules, stable right adrenal nodule Navigation bronchoscopy 01/30/2021-left lower lobe cavitary nodule FNA-adenocarcinoma, brushing-adenocarcinoma.  Left lower lobe lavage-adenocarcinoma.  Right upper lobe brushing and FNA biopsy of cavitary nodule-adenocarcinoma-immunohistochemical profile consistent with pancreas adenocarcinoma Cycle 1 gemcitabine /Abraxane  03/28/2021 Cycle 2 gemcitabine /Abraxane  04/11/2021 Cycle 3 gemcitabine /Abraxane  04/25/2021 Cycle 4 gemcitabine /Abraxane  05/09/2021 Cycle 5 gemcitabine /Abraxane  05/23/2021 CT chest 06/05/2021-interval cavitation of multiple pulmonary nodules, some nodules have decreased in size, no new or enlarging nodules Cycle 6 gemcitabine /Abraxane  06/06/2021 Cycle 7 gemcitabine /Abraxane  06/21/2021 Cycle 8 gemcitabine /Abraxane  07/05/2021 Cycle 9 Gemcitabine /Abraxane  07/19/2021 Cycle 10 gemcitabine  08/01/2021-Abraxane  held secondary to neuropathy CT chest 08/13/2021-mild decrease  in size and wall thickness of multiple cavitary nodules, no new or progressive disease in the chest, indeterminate low-attenuation right liver lesions Cycle 11 gemcitabine  08/16/2021-Abraxane  held secondary to neuropathy Cycle 12 gemcitabine  08/30/2021-Abraxane  held secondary to neuropathy Cycle 13 gemcitabine  09/13/2021-Abraxane  held secondary to neuropathy Cycle 14 gemcitabine  09/27/2021-Abraxane  held secondary to neuropathy Cycle 15 gemcitabine  10/11/2021-Abraxane  held secondary to neuropathy CTs 10/22/2021-no change in multiple cavitary lung nodules, no evidence of disease progression, ill-defined hypodense lesion in the posterior right liver suspicious for metastatic disease Cycle 16 gemcitabine  10/25/2021 Cycle 17 gemcitabine  11/08/2021 Cycle 18 Gemcitabine  11/22/2021 Cycle 19 gemcitabine  12/06/2021 Cycle 20 Gemcitabine  12/20/2021 CT 12/31/2021-mild increase in size of bilateral pulmonary metastases, stable subtle continuation right liver lesions Cycle 20 gemcitabine /Abraxane  01/03/2022 Cycle 21 gemcitabine /Abraxane  01/17/2022 Cycle 22 gemcitabine /Abraxane  01/31/2022 Cycle 23 gemcitabine /Abraxane  02/14/2022 Cycle 24 gemcitabine /Abraxane  02/28/2022 CTs 03/11/2022-widespread metastatic disease to the lungs again noted with slight involution of several of the pulmonary nodules, no definite new nodules noted; interval cavitation of solid lesion in the posterior aspect right lobe of the liver, no new liver lesions noted. Cycle 25 Gemcitabine /Abraxane  03/14/2022 Cycle 26 gemcitabine /Abraxane  03/28/2022 Cycle 27 gemcitabine /Abraxane  04/25/2022 Cycle 28 gemcitabine /Abraxane  05/09/2022 Cycle 29 Gemcitabine  05/23/2022, Abraxane  held due to neuropathy CTs 06/04/2022-stable multifocal cavitary pulmonary nodules.  No definite new nodules.  No new nodules greater than a centimeter.  Decreased size of the right posterior hepatic lobe lesion.  Generalized heterogeneity and new focal areas of hypodensity in the liver. Cycle  30 Gemcitabine  06/06/2022, Abraxane  held due to neuropathy Cycle 31 gemcitabine  06/20/2022, Abraxane  held due to neuropathy Cycle 32 gemcitabine  07/04/2022, Abraxane  held due to neuropathy Cycle 33 gemcitabine   07/18/2022 ,Abraxane  held due to neuropathy Cycle 34 gemcitabine  08/15/2022, Abraxane  held due to neuropathy Cycle 35 gemcitabine  08/29/2022, Abraxane  held due to neuropathy CTs 09/10/2022-enlarging pulmonary metastases.  Subtle poorly defined hypoattenuating lesions in the liver, less well-seen than on 06/04/2022.  Suspected new lesion in the dome of the right hepatic lobe Cycle 36 gemcitabine /Abraxane  09/12/2022 Cycle 37 gemcitabine /Abraxane   09/26/2022 Cycle 38 gemcitabine /Abraxane  10/10/2022 Cycle 39 gemcitabine /Abraxane  11/07/2022 Cycle 40 gemcitabine /Abraxane  11/21/2022 Cycle 41 gemcitabine /Abraxane  12/05/2022 Cycle 42 gemcitabine /Abraxane  12/19/2022 CT is 12/30/2022-new left hepatic lesion, stable bilateral pulmonary metastases Cycle 43 gemcitabine /Abraxane  01/02/2023 CT-guided biopsy of liver lesion 01/27/2023-acute/chronic inflammation, negative for malignancy, culture negative Cycle 44 gemcitabine /Abraxane  01/30/2023 Cycle 45 gemcitabine /Abraxane  02/13/2023 Cycle 46 gemcitabine /Abraxane  02/27/2023 Cycle 47 gemcitabine /Abraxane  03/13/2023 Cycle 48 gemcitabine /Abraxane  03/27/2023 CTs 04/21/2023-numerous solid and cavitary lung nodules overall similar, some slightly enlarged; multiple new and enlarging rim-enhancing liver lesions. Cycle 1 FOLFIRI with liposomal irinotecan  05/21/2023 Cycle 2 held 06/05/2023 due to neutropenia Cycle 2 FOLFIRI with liposomal irinotecan  06/18/2023, irinotecan  dose reduced (white cell growth factor declined per patient) Cycle 3 FOLFIRI with liposomal irinotecan  07/02/2023 Cycle 4 FOLFIRI with liposomal irinotecan  07/16/2023 Cycle 5 FOLFIRI with liposomal irinotecan  07/30/2023 Cycle 6 FOLFIRI with liposomal irinotecan  08/13/2023 CTs 08/20/2023-numerous cavitary lesions seen in the  lungs, extent and distribution appear similar.  A few lesions are slightly smaller.  Numerous liver lesions are now smaller.  Stable right adrenal nodule, adenoma favored. Cycle 7 FOLFIRI with liposomal irinotecan  08/27/2023 Cycle 8 FOLFIRI with liposomal irinotecan  09/11/2023 Cycle 9 FOLFIRI with liposomal irinotecan  09/24/2023 Cycle 10 FOLFIRI with liposomal irinotecan  10/08/2023 Cycle 11 FOLFIRI with liposomal irinotecan  10/22/2023 Cycle 12 FOLFIRI with liposomal irinotecan  11/05/2023 CT 11/17/2023-majority of multiple irregular thick-walled cavitary lesions throughout bilateral lungs grossly unchanged or slightly decreased in size.  Redemonstration of peripheral wedge-shaped ill-defined hypoattenuating areas in the liver suggesting treatment response.  No discrete new suspicious liver lesions seen.  No new metastatic disease. Cycle 13 FOLFIRI with liposomal irinotecan  11/19/2023 Cycle 14 FOLFIRI with liposomal irinotecan  12/03/2023 Cycle 15 FOLFIRI with liposomal irinotecan  12/24/2023 Cycle 16 FOLFIRI with liposomal irinotecan  01/14/2024 02/04/2024 treatment held due to elevated LFTs, referred for CTs CTs 02/04/2024-liver lesions less conspicuous.  No significant new lesions identified.  Innumerable scattered cavitary lung nodules bilaterally.  Most are similar but a few demonstrate reduced thickening of the wall or reduced surrounding inflammatory stranding along the margins.  No substantial new nodularity observed. 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  Apixaban  10/24/2022 Port-A-Cath malfunction 08/01/2023.  Port-A-Cath removed and replaced 08/08/2023.      Disposition: Alyssa Ross appears stable.  She is tolerating the chemotherapy well.  She will complete another cycle of 5-FU/liposomal irinotecan  today.  She will return for an office visit and chemotherapy in 3 weeks.  We will follow-up on the CA 19-9 from today.   Arley Hof, MD  03/17/2024  9:19 AM

## 2024-03-18 LAB — CANCER ANTIGEN 19-9: CA 19-9: 167 U/mL — ABNORMAL HIGH (ref 0–35)

## 2024-03-19 ENCOUNTER — Other Ambulatory Visit: Payer: Self-pay | Admitting: Pharmacist

## 2024-03-19 ENCOUNTER — Inpatient Hospital Stay

## 2024-03-19 ENCOUNTER — Encounter: Payer: Self-pay | Admitting: Oncology

## 2024-03-19 VITALS — BP 126/83 | HR 57 | Temp 98.2°F | Resp 18

## 2024-03-19 DIAGNOSIS — C250 Malignant neoplasm of head of pancreas: Secondary | ICD-10-CM

## 2024-03-20 DIAGNOSIS — C259 Malignant neoplasm of pancreas, unspecified: Secondary | ICD-10-CM | POA: Diagnosis not present

## 2024-03-30 ENCOUNTER — Inpatient Hospital Stay: Admitting: Nurse Practitioner

## 2024-03-30 ENCOUNTER — Encounter: Payer: Self-pay | Admitting: Nurse Practitioner

## 2024-03-30 DIAGNOSIS — R530 Neoplastic (malignant) related fatigue: Secondary | ICD-10-CM | POA: Diagnosis not present

## 2024-03-30 DIAGNOSIS — Z7189 Other specified counseling: Secondary | ICD-10-CM

## 2024-03-30 DIAGNOSIS — Z515 Encounter for palliative care: Secondary | ICD-10-CM | POA: Diagnosis not present

## 2024-03-30 DIAGNOSIS — R630 Anorexia: Secondary | ICD-10-CM | POA: Diagnosis not present

## 2024-03-30 DIAGNOSIS — C7A8 Other malignant neuroendocrine tumors: Secondary | ICD-10-CM

## 2024-03-30 NOTE — Progress Notes (Signed)
 Palliative Medicine [facility]  Telephone:(336) [phone] Fax:(336) [phone]   Name: Alyssa Ross Date: 03/30/2024 MRN: [id number]  DOB: [birth year]  Patient Care Team: Jarold Medici, MD as PCP - General (Internal Medicine) Luvenia Morrison, RN (Inactive) as Oncology Nurse Navigator Pickenpack-Cousar, Fannie SAILOR, NP as Nurse Practitioner (Hospice and Palliative Medicine) Jolee Cassius PARAS, [facility] as Pharmacist (Pharmacist)   I connected with Alyssa Ross on 03/30/24 at  1:00 PM EDT by Phone and verified that I am speaking with the correct person using two identifiers.   I discussed the limitations, risks, security and privacy concerns of performing an evaluation and management service by telemedicine and the availability of in-person appointments. I also discussed with the patient that there may be a patient responsible charge related to this service. The patient expressed understanding and agreed to proceed.   Other persons participating in the visit and their role in the encounter: N/A   Patient's location: Home  Provider's location: [facility]    INTERVAL HISTORY: Alyssa Ross is a 58 y.o. female with oncologic medical history including pancreatic cancer (01/2019). The patient is 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 and is currently receiving FOLFIRI with liposomal irinotecan . Palliative ask to see for symptom management and goals of care.   SOCIAL HISTORY:     reports that she has never smoked. She has never used smokeless tobacco. She reports that she does not currently use alcohol. She reports that she does not use drugs.  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  MEDICATIONS:  Current Outpatient Medications  Medication Sig Dispense Refill   apixaban  (ELIQUIS ) 5 MG TABS tablet Take 1 tablet (5 mg total) by mouth 2 (two) times daily. 180 tablet 2   cetirizine (ZYRTEC) 10 MG tablet Take 10 mg by mouth daily.     Continuous Blood Gluc Sensor (FREESTYLE LIBRE 2 SENSOR) MISC APPLY EVERY 14 DAYS 2 each 5   glucose blood test strip Check sugar 2 times daily. 100 each 12   hydrOXYzine  (ATARAX ) 10 MG tablet Take 1 tablet (10 mg total) by mouth 3 (three) times daily as needed. 30 tablet 0   Insulin  Degludec (TRESIBA  FLEXTOUCH [place]) Inject into the skin. 12-18 Units     insulin  lispro (HUMALOG  KWIKPEN) 200 UNIT/ML KwikPen Humalog  8 units with low carb meals (or use 1:7 ratio )  14 units with high carb meals (and high carb snacks)  Correction scale Blood sugar 30 minutes if BG  0- 149 no insulin  150 - 199 1 unit  200 - 249 2 units 250 - 299 3 units 300 - 349 4  units 350 - 399 5 units > 400 6 units and contact clinic [phone]     Insulin  Pen Needle (BD PEN NEEDLE NANO U/F) 32G X 4 MM MISC USE TO INJECT INSULIN  THREE TIMES A DAY 270 each 3   lidocaine -prilocaine  (EMLA ) cream Apply 1 Application topically as directed. Apply 1 hour prior to stick and cover with plastic wrap 30 g 5   magnesium  oxide (MAG-OX) 400 (240 Mg) MG tablet TAKE 1 TABLET(400 MG) BY MOUTH TWICE DAILY 180 tablet 1   ondansetron  (ZOFRAN ) 8 MG tablet Take 1 tablet (8 mg total) by mouth every 8 (eight) hours as needed  for nausea or vomiting (do not begin until 72 hours after chemo). (Patient not taking: Reported on 03/17/2024) 20 tablet 3   Polyethylene Glycol 3350 (MIRALAX PO) Take 17 g by mouth daily. (Patient not taking: Reported on 03/17/2024)     potassium chloride  SA (KLOR-CON  M) 20 MEQ tablet Take 1 tablet (20 mEq total) by mouth 4 (four) times daily. 360 tablet 1   prochlorperazine  (COMPAZINE ) 10 MG tablet TAKE 1 TABLET(10 MG) BY MOUTH EVERY 6 HOURS AS NEEDED FOR NAUSEA OR VOMITING (Patient not taking: Reported on 03/17/2024) 60 tablet 2   rosuvastatin  (CRESTOR ) 10 MG tablet Take 1 tablet (10 mg total) by mouth 3 (three) times a week. 90 tablet 3   spironolactone  (ALDACTONE ) 25 MG tablet TAKE 1 TABLET(25 MG) BY MOUTH DAILY 90 tablet 2   valsartan  (DIOVAN ) 160 MG tablet Take 1 tablet (160 mg total) by mouth daily. 90 tablet 3   No current facility-administered medications for this visit.   Facility-Administered Medications Ordered in Other Visits  Medication Dose Route Frequency Provider Last Rate Last Admin   sodium chloride  flush (NS) 0.9 % injection 10 mL  10 mL Intravenous PRN [name], Lisa K, NP   10 mL at 10/24/22 1311    VITAL SIGNS: LMP 09/17/2012  There were no vitals filed for this visit.  Estimated body mass index is 28.52 kg/m as calculated from the following:   Height as of 03/17/24: 5' 8 (1.727 m).   Weight as of 03/17/24: 187 lb 9.6 oz (85.1 kg).   PERFORMANCE STATUS (ECOG) : 1 - Symptomatic but completely ambulatory  IMPRESSION: Discussed the use of AI scribe software for clinical note transcription with the patient, who gave verbal consent to proceed.  History of Present Illness Alyssa Ross is a 58 year old female who I connected by phone with for follow-up. No acute distress. She is doing well overall. Taking things one day at a time.  Denies concerns of uncontrolled nausea, vomiting, constipation, or diarrhea.  No pain or discomfort.  She has experienced elevated liver enzymes  during her recent treatments, which is a change from previous patterns where she would spike and then correct herself. Her alkaline phosphatase decreased from 583 to 539, and AST decreased from 105 to 72. She is concerned about the potential impact on her ability to continue treatment, although she has not experienced symptoms typically associated with high liver enzymes. No jaundice or abdominal pain. She experiences daily pruritus under her arms, which she is unsure if it is related to her liver enzyme levels. Her bilirubin is within the normal range at 0.9.  She has experienced fluctuations in her CA19-9 levels, which she attributes to being off schedule with her treatment due to high bilirubin levels in the past. She went five weeks without treatment at one point and then four weeks to get  back on schedule. She questions whether high liver enzymes could affect CA19-9 levels. Has scheduled follow-up with planned labs and treatment with oncology on next week. Hopeful for improvement in lab results.   She reports digestive issues, noting that she can no longer tolerate certain foods she used to eat, such as baked pork chops and pinto beans, which now cause steatorrhea and significant flatulence. She has been eating more fish and anti-inflammatory foods to help with neuropathy, which has improved. She is concerned about the impact of these digestive issues on her quality of life, as they require her to be near a bathroom and limit her dietary options. Discussed if no improvement over next several weeks may have to consider use of oral enzymes. She verbalized understanding.   She has a pancreatic stent in place from her surgery, and there is some concern about whether it has moved, although scans have not shown any changes.  She has mild calcifications noted on her scans, which have been mentioned in previous scans. She is monitoring her cholesterol and blood pressure.  Goals of Care Patient has completed  advanced directives naming Mondalyn and Sherri Mcarthy as her medical decision makers in the event she is unable to speak for herself.  No desire for artificial feedings.  No life prolonging measures in the event of a terminal state.  I discussed the importance of continued conversation with family and their medical providers regarding overall plan of care and treatment options, ensuring decisions are within the context of the patients values and GOCs. Assessment & Plan Pancreatic cancer, post-Whipple, on active treatment Pancreatic cancer post-Whipple procedure, currently on active treatment. Recent scans shows some areas have decreased in size in cancer. CA19-9 levels have fluctuated, recently increased, possibly due to treatment schedule disruptions. Treatment is reportedly effective, and she feels well overall. - Continue current cancer treatment regimen - Monitor CA19-9 levels - Review treatment efficacy and side effects at next appointment  Exocrine pancreatic insufficiency symptoms (malabsorption, oily stools, gas) Symptoms of exocrine pancreatic insufficiency include malabsorption, oily stools, and gas. Symptoms have worsened over time, possibly related to treatment and post-Whipple changes. She reports difficulty digesting certain foods, leading to gastrointestinal discomfort and lifestyle impact. - Monitor symptoms and dietary intake - Consider pancreatic enzyme replacement if symptoms persist - Evaluate gastrointestinal symptoms at next appointment  Elevated liver enzymes during cancer treatment Elevated liver enzymes likely treatment-related. AST and alkaline phosphatase levels have decreased slightly but remain elevated. Bilirubin is within normal range. No significant symptoms reported, though she experiences itching under arms. Dietary modifications suggested by dietitian to reduce sugar and fat intake. - Monitor liver enzyme levels - Implement dietary changes to reduce sugar and fat  intake - Evaluate liver function at next appointment per oncology.  Pancreatic stent in situ, post-Whipple Pancreatic stent remains in situ post-Whipple. No evidence of stent migration or dysfunction on recent scans. She expresses concern about potential stent movement, but no changes noted by tumor board or on imaging. - Monitor stent function and position - Evaluate stent status at next appointment  Peripheral neuropathy, improving Peripheral neuropathy symptoms have shown improvement, possibly due to dietary changes or treatment schedule adjustments. She reports some relief from neuropathy symptoms. - Continue current dietary and treatment regimen  Follow-Up Plan to follow up with her to monitor her condition and address any concerns. - Schedule follow-up phone call in 6 to 8 weeks. - Advise contacting the provider if any issues arise before the scheduled follow-up.  Patient  expressed understanding and was in agreement with this plan. She also understands that She can call the clinic at any time with any questions, concerns, or complaints.   I personally spent a total of 40 minutes in the care of the patient today including preparing to see the patient, getting/reviewing separately obtained history, performing a medically appropriate exam/evaluation, counseling and educating, referring and communicating with other health care professionals, documenting clinical information in the EHR, independently interpreting results, communicating results, and coordinating care.  Visit consisted of counseling and education dealing with the complex and emotionally intense issues of symptom management and palliative care in the setting of serious and potentially life-threatening illness.  Levon Borer, AGPCNP-BC  Palliative Medicine Team/[facility]

## 2024-04-06 ENCOUNTER — Telehealth: Payer: Self-pay | Admitting: Pharmacist

## 2024-04-06 DIAGNOSIS — E1165 Type 2 diabetes mellitus with hyperglycemia: Secondary | ICD-10-CM

## 2024-04-06 NOTE — Progress Notes (Signed)
   04/06/2024  Patient ID: Alyssa Ross, female   DOB: July 07, 1965, 58 y.o.   MRN: [id number]  Patient was called to follow up on diabetes management after her 03/09/2024 appointment with Dr. Abigail Gearing with [facility]. HIPAA identifiers were obtained.   She has a past medical history includes but is not limited to: pancreatic cancer, hypertension, hyperlipidemia, and type 2 diabetes.   Reviewed Patient's blood sugars:   She increased her Tresiba  dose to 20 units in the morning and uses Humalog  per sliding scale with correction (or by giving what she thinks will bring her blood sugar down).  She reported not dosing Humalog  at her evening meal and then giving herself four units of Humalog  at bedtime.  I think this is what contributed to her low blood sugars in her sleep during the early morning hours.    Patient agreed to dosing her short acting insulin  more consistently with meals but did not agree with increasing her Tresiba  dose.  Lab Results  Component Value Date   HGBA1C 9.1 (H) 12/03/2023   HGBA1C 8.5 (H) 07/02/2023   HGBA1C 9.3 (H) 12/17/2022   HgA1c is still elevated but Patient is currently undergoing chemotherapy and is under a tremendous amount of stress personally, professionally and medically.     Plan: Scheduled an appointment to call Patient back on May 12, 2024 at 10 am.   Cassius DOROTHA Brought, PharmD, [facility] Clinical Pharmacist [phone]

## 2024-04-07 ENCOUNTER — Inpatient Hospital Stay (HOSPITAL_BASED_OUTPATIENT_CLINIC_OR_DEPARTMENT_OTHER): Admitting: Oncology

## 2024-04-07 ENCOUNTER — Other Ambulatory Visit: Payer: Self-pay | Admitting: Nurse Practitioner

## 2024-04-07 ENCOUNTER — Inpatient Hospital Stay

## 2024-04-07 VITALS — BP 132/88 | HR 75 | Temp 98.1°F | Resp 18 | Ht 68.0 in | Wt 188.1 lb

## 2024-04-07 VITALS — BP 129/83 | HR 64 | Temp 98.2°F | Resp 18

## 2024-04-07 DIAGNOSIS — C259 Malignant neoplasm of pancreas, unspecified: Secondary | ICD-10-CM | POA: Diagnosis not present

## 2024-04-07 DIAGNOSIS — E876 Hypokalemia: Secondary | ICD-10-CM

## 2024-04-07 DIAGNOSIS — Z23 Encounter for immunization: Secondary | ICD-10-CM

## 2024-04-07 DIAGNOSIS — C250 Malignant neoplasm of head of pancreas: Secondary | ICD-10-CM

## 2024-04-07 DIAGNOSIS — Z5111 Encounter for antineoplastic chemotherapy: Secondary | ICD-10-CM | POA: Diagnosis not present

## 2024-04-07 LAB — CMP (CANCER CENTER ONLY)
ALT: 33 U/L (ref 0–44)
AST: 30 U/L (ref 15–41)
Albumin: 3.6 g/dL (ref 3.5–5.0)
Alkaline Phosphatase: 367 U/L — ABNORMAL HIGH (ref 38–126)
Anion gap: 7 (ref 5–15)
BUN: 7 mg/dL (ref 6–20)
CO2: 26 mmol/L (ref 22–32)
Calcium: 8.9 mg/dL (ref 8.9–10.3)
Chloride: 107 mmol/L (ref 98–111)
Creatinine: 0.9 mg/dL (ref 0.44–1.00)
GFR, Estimated: >60 mL/min (ref >60)
Glucose, Bld: 200 mg/dL — ABNORMAL HIGH (ref 70–99)
Potassium: 3.7 mmol/L (ref 3.5–5.1)
Sodium: 140 mmol/L (ref 135–145)
Total Bilirubin: 0.6 mg/dL (ref 0.0–1.2)
Total Protein: 7 g/dL (ref 6.5–8.1)

## 2024-04-07 LAB — CBC WITH DIFFERENTIAL (CANCER CENTER ONLY)
Abs Immature Granulocytes: 0.01 K/uL (ref 0.00–0.07)
Basophils Absolute: 0 K/uL (ref 0.0–0.1)
Basophils Relative: 1 %
Eosinophils Absolute: 0.1 K/uL (ref 0.0–0.5)
Eosinophils Relative: 4 %
HCT: 34.1 % — ABNORMAL LOW (ref 36.0–46.0)
Hemoglobin: 10.9 g/dL — ABNORMAL LOW (ref 12.0–15.0)
Immature Granulocytes: 0 %
Lymphocytes Relative: 39 %
Lymphs Abs: 1.2 K/uL (ref 0.7–4.0)
MCH: 31.8 pg (ref 26.0–34.0)
MCHC: 32 g/dL (ref 30.0–36.0)
MCV: 99.4 fL (ref 80.0–100.0)
Monocytes Absolute: 0.5 K/uL (ref 0.1–1.0)
Monocytes Relative: 15 %
Neutro Abs: 1.3 K/uL — ABNORMAL LOW (ref 1.7–7.7)
Neutrophils Relative %: 41 %
Platelet Count: 296 K/uL (ref 150–400)
RBC: 3.43 MIL/uL — ABNORMAL LOW (ref 3.87–5.11)
RDW: 12.6 % (ref 11.5–15.5)
WBC Count: 3.1 K/uL — ABNORMAL LOW (ref 4.0–10.5)
nRBC: 0 % (ref 0.0–0.2)

## 2024-04-07 MED ORDER — SODIUM CHLORIDE 0.9 % IV SOLN: INTRAVENOUS | Status: DC

## 2024-04-07 MED ORDER — SODIUM CHLORIDE 0.9 % IV SOLN
400.0000 mg/m2 | Freq: Once | INTRAVENOUS | Status: AC
  Administered 2024-04-07: 776 mg via INTRAVENOUS
  Filled 2024-04-07: qty 38.8

## 2024-04-07 MED ORDER — PALONOSETRON HCL INJECTION 0.25 MG/5ML
0.2500 mg | Freq: Once | INTRAVENOUS | Status: AC
  Administered 2024-04-07: 0.25 mg via INTRAVENOUS
  Filled 2024-04-07: qty 5

## 2024-04-07 MED ORDER — SODIUM CHLORIDE 0.9 % IV SOLN
2400.0000 mg/m2 | INTRAVENOUS | Status: DC
  Administered 2024-04-07: 5000 mg via INTRAVENOUS
  Filled 2024-04-07: qty 100

## 2024-04-07 MED ORDER — INFLUENZA VIRUS VACC SPLIT PF (FLUZONE) 0.5 ML IM SUSY
0.5000 mL | PREFILLED_SYRINGE | Freq: Once | INTRAMUSCULAR | Status: AC
  Administered 2024-04-07: 0.5 mL via INTRAMUSCULAR
  Filled 2024-04-07: qty 0.5

## 2024-04-07 MED ORDER — SODIUM CHLORIDE 0.9 % IV SOLN
55.0000 mg/m2 | Freq: Once | INTRAVENOUS | Status: AC
  Administered 2024-04-07: 107.5 mg via INTRAVENOUS
  Filled 2024-04-07: qty 25

## 2024-04-07 MED ORDER — ATROPINE SULFATE 1 MG/ML IV SOLN
0.5000 mg | Freq: Once | INTRAVENOUS | Status: AC | PRN
  Administered 2024-04-07: 0.5 mg via INTRAVENOUS
  Filled 2024-04-07: qty 1

## 2024-04-07 MED ORDER — DEXAMETHASONE SODIUM PHOSPHATE 10 MG/ML IJ SOLN
10.0000 mg | Freq: Once | INTRAMUSCULAR | Status: AC
  Administered 2024-04-07: 10 mg via INTRAVENOUS

## 2024-04-07 NOTE — Progress Notes (Signed)
 Patient seen by Dr. Arley Hof today  Vitals are within treatment parameters:Yes   Labs are within treatment parameters: Yes WBC 3.1 Neut 1.3   Treatment plan has been signed: Yes   Per physician team, Patient is ready for treatment and there are NO modifications to the treatment plan.

## 2024-04-07 NOTE — Patient Instructions (Signed)

## 2024-04-07 NOTE — Progress Notes (Signed)
 [facility] OFFICE PROGRESS NOTE   Diagnosis: Pancreas cancer  INTERVAL HISTORY:   Ms. Llamas completed another cycle of 5-FU/liposomal irinotecan  on 03/17/2024.  No nausea/vomiting, mouth sores, or diarrhea.  She feels well.  Good appetite.  No new complaint.  Neuropathy symptoms have improved.  Objective:  Vital signs in last 24 hours:  Blood pressure 132/88, pulse 75, temperature 98.1 F (36.7 C), temperature source Temporal, resp. rate 18, height 5' 8 (1.727 m), weight 188 lb 1.6 oz (85.3 kg), last menstrual period 09/17/2012, SpO2 100%.    HEENT: No thrush or ulcers Resp: Lungs clear bilaterally Cardio: Regular rate and rhythm GI: No hepatosplenomegaly, no mass, nontender Vascular: No leg edema  Skin: Palms without erythema  Portacath/PICC-without erythema  Lab Results:  Lab Results  Component Value Date   WBC 3.1 (L) 04/07/2024   HGB 10.9 (L) 04/07/2024   HCT 34.1 (L) 04/07/2024   MCV 99.4 04/07/2024   PLT 296 04/07/2024   NEUTROABS 1.3 (L) 04/07/2024    CMP  Lab Results  Component Value Date   NA 136 03/17/2024   K 3.9 03/17/2024   CL 102 03/17/2024   CO2 24 03/17/2024   GLUCOSE 317 (H) 03/17/2024   BUN 9 03/17/2024   CREATININE 0.86 03/17/2024   CALCIUM  9.0 03/17/2024   PROT 7.2 03/17/2024   ALBUMIN  3.6 03/17/2024   AST 72 (H) 03/17/2024   ALT 104 (H) 03/17/2024   ALKPHOS 539 (H) 03/17/2024   BILITOT 0.9 03/17/2024   GFRNONAA >60 03/17/2024   GFRAA >60 02/07/2020    Lab Results  Component Value Date   RJW800 167 (H) 03/17/2024     Medications: I have reviewed the patient's current medications.   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 Tumor invades the duodenal wall and vascular groove, resection margins negative, 4/34 lymph nodes positive MSI-stable, tumor showed instability in 2 loci as did adjacent normal tissue Foundation 1-K-ras G12 V, microsatellite status  and tumor mutation burden cannot be determined EUS FNA biopsy of pancreas mass on 07/03/2018-well-differentiated neuroendocrine tumor CTs 01/29/2019-ill-defined pancreas head mass, 5 pulmonary nodules-1 with a small amount of central cavitation, tumor abuts the left margin of the portal vein indistinct density surrounding, hepatic artery, complex cystic lesion of the right kidney, right adrenal mass-characterized as an adenoma on a [facility] MRI 12/21/2018 [facility]  03/03/2019-no focal pancreas activity, no tracer accumulation within the suspicious pulmonary nodules, left uterine mass with tracer accumulation felt to represent a leiomyoma Elevated preoperative CA 19-9--CA 19-9 186 on 01/18/2019 CT chest 04/16/2019-multiple bilateral pulmonary nodules, some with increased cavitation, stable in size Cycle 1 FOLFIRINOX 04/27/2019 Cycle 2 FOLFIRINOX 05/11/2019 Cycle 3 FOLFIRINOX 05/23/2019 Cycle 4 FOLFIRINOX 06/08/2019 Cycle 5 FOLFIRINOX 06/22/2019 CT chest 07/02/2019-stable size of bilateral pulmonary nodules.  Dominant cavitary lesions in both lungs show increased cavitation with thinner walls.  Stable 2.1 cm right adrenal nodule. Cycle 6 FOLFIRINOX 07/06/2019 Cycle 7 FOLFIRINOX 07/21/2019 Cycle 8 FOLFIRINOX 08/03/2019, oxaliplatin  deleted secondary to neuropathy CT chest 08/24/2019-decreased size of several lung nodules with resolution of a left upper lobe nodule, no new nodules Radiation to the pancreas surgical area with concurrent Xeloda  09/13/2019-10/20/2019  CTs 11/29/2019-multiple small pulmonary nodules scattered throughout the lungs bilaterally, appear increased in number and size. No definite evidence of metastatic disease in the abdomen or pelvis. Markedly enlarged and heterogeneous appearing uterus, likely to represent multifocal fibroids. 1 of these lesions appears to be an exophytic subserosal fibroid in the posterior lateral aspect of  the uterine body on the left side although this comes in close proximity  to the left adnexa such that a primary ovarian lesion is difficult to completely exclude. CTs 02/07/2020-slight enlargement of bilateral lung nodules, some are cavitary, no evidence of metastatic disease in the abdomen or pelvis, stable right adrenal nodule, uterine fibroids CTs 04/26/2020-mild enlargement of pulmonary nodules, slight increase in trace pelvic fluid, new soft tissue thickening inferior to the cecal tip suspicious for peritoneal metastasis CT 05/26/2020-improved appearance of soft tissue at the inferior tip of the cecum, mildly thickened short appendix-findings suggestive of resolving appendicitis, stable small bibasilar pulmonary nodules, fibroids Plan biopsy of right cecal tip soft tissue canceled secondary to radiologic improvement CT chest 08/02/2020-enlargement and progressive cavitation of multiple bilateral lung nodules.  Some new nodules are present. CTs 10/24/2020- increase in size of pulmonary nodules, no new nodules, no evidence of metastatic disease in the abdomen, stable right adrenal nodule CT 01/09/2021-slight interval enlargement of pulmonary nodules, stable right adrenal nodule Navigation bronchoscopy 01/30/2021-left lower lobe cavitary nodule FNA-adenocarcinoma, brushing-adenocarcinoma.  Left lower lobe lavage-adenocarcinoma.  Right upper lobe brushing and FNA biopsy of cavitary nodule-adenocarcinoma-immunohistochemical profile consistent with pancreas adenocarcinoma Cycle 1 gemcitabine /Abraxane  03/28/2021 Cycle 2 gemcitabine /Abraxane  04/11/2021 Cycle 3 gemcitabine /Abraxane  04/25/2021 Cycle 4 gemcitabine /Abraxane  05/09/2021 Cycle 5 gemcitabine /Abraxane  05/23/2021 CT chest 06/05/2021-interval cavitation of multiple pulmonary nodules, some nodules have decreased in size, no new or enlarging nodules Cycle 6 gemcitabine /Abraxane  06/06/2021 Cycle 7 gemcitabine /Abraxane  06/21/2021 Cycle 8 gemcitabine /Abraxane  07/05/2021 Cycle 9 Gemcitabine /Abraxane  07/19/2021 Cycle 10 gemcitabine   08/01/2021-Abraxane  held secondary to neuropathy CT chest 08/13/2021-mild decrease in size and wall thickness of multiple cavitary nodules, no new or progressive disease in the chest, indeterminate low-attenuation right liver lesions Cycle 11 gemcitabine  08/16/2021-Abraxane  held secondary to neuropathy Cycle 12 gemcitabine  08/30/2021-Abraxane  held secondary to neuropathy Cycle 13 gemcitabine  09/13/2021-Abraxane  held secondary to neuropathy Cycle 14 gemcitabine  09/27/2021-Abraxane  held secondary to neuropathy Cycle 15 gemcitabine  10/11/2021-Abraxane  held secondary to neuropathy CTs 10/22/2021-no change in multiple cavitary lung nodules, no evidence of disease progression, ill-defined hypodense lesion in the posterior right liver suspicious for metastatic disease Cycle 16 gemcitabine  10/25/2021 Cycle 17 gemcitabine  11/08/2021 Cycle 18 Gemcitabine  11/22/2021 Cycle 19 gemcitabine  12/06/2021 Cycle 20 Gemcitabine  12/20/2021 CT 12/31/2021-mild increase in size of bilateral pulmonary metastases, stable subtle continuation right liver lesions Cycle 20 gemcitabine /Abraxane  01/03/2022 Cycle 21 gemcitabine /Abraxane  01/17/2022 Cycle 22 gemcitabine /Abraxane  01/31/2022 Cycle 23 gemcitabine /Abraxane  02/14/2022 Cycle 24 gemcitabine /Abraxane  02/28/2022 CTs 03/11/2022-widespread metastatic disease to the lungs again noted with slight involution of several of the pulmonary nodules, no definite new nodules noted; interval cavitation of solid lesion in the posterior aspect right lobe of the liver, no new liver lesions noted. Cycle 25 Gemcitabine /Abraxane  03/14/2022 Cycle 26 gemcitabine /Abraxane  03/28/2022 Cycle 27 gemcitabine /Abraxane  04/25/2022 Cycle 28 gemcitabine /Abraxane  05/09/2022 Cycle 29 Gemcitabine  05/23/2022, Abraxane  held due to neuropathy CTs 06/04/2022-stable multifocal cavitary pulmonary nodules.  No definite new nodules.  No new nodules greater than a centimeter.  Decreased size of the right posterior hepatic lobe lesion.   Generalized heterogeneity and new focal areas of hypodensity in the liver. Cycle 30 Gemcitabine  06/06/2022, Abraxane  held due to neuropathy Cycle 31 gemcitabine  06/20/2022, Abraxane  held due to neuropathy Cycle 32 gemcitabine  07/04/2022, Abraxane  held due to neuropathy Cycle 33 gemcitabine   07/18/2022 ,Abraxane  held due to neuropathy Cycle 34 gemcitabine  08/15/2022, Abraxane  held due to neuropathy Cycle 35 gemcitabine  08/29/2022, Abraxane  held due to neuropathy CTs 09/10/2022-enlarging pulmonary metastases.  Subtle poorly defined hypoattenuating lesions in the liver, less well-seen than on 06/04/2022.  Suspected new lesion in the dome  of the right hepatic lobe Cycle 36 gemcitabine /Abraxane  09/12/2022 Cycle 37 gemcitabine /Abraxane  09/26/2022 Cycle 38 gemcitabine /Abraxane  10/10/2022 Cycle 39 gemcitabine /Abraxane  11/07/2022 Cycle 40 gemcitabine /Abraxane  11/21/2022 Cycle 41 gemcitabine /Abraxane  12/05/2022 Cycle 42 gemcitabine /Abraxane  12/19/2022 CT is 12/30/2022-new left hepatic lesion, stable bilateral pulmonary metastases Cycle 43 gemcitabine /Abraxane  01/02/2023 CT-guided biopsy of liver lesion 01/27/2023-acute/chronic inflammation, negative for malignancy, culture negative Cycle 44 gemcitabine /Abraxane  01/30/2023 Cycle 45 gemcitabine /Abraxane  02/13/2023 Cycle 46 gemcitabine /Abraxane  02/27/2023 Cycle 47 gemcitabine /Abraxane  03/13/2023 Cycle 48 gemcitabine /Abraxane  03/27/2023 CTs 04/21/2023-numerous solid and cavitary lung nodules overall similar, some slightly enlarged; multiple new and enlarging rim-enhancing liver lesions. Cycle 1 FOLFIRI with liposomal irinotecan  05/21/2023 Cycle 2 held 06/05/2023 due to neutropenia Cycle 2 FOLFIRI with liposomal irinotecan  06/18/2023, irinotecan  dose reduced (white cell growth factor declined per patient) Cycle 3 FOLFIRI with liposomal irinotecan  07/02/2023 Cycle 4 FOLFIRI with liposomal irinotecan  07/16/2023 Cycle 5 FOLFIRI with liposomal irinotecan  07/30/2023 Cycle 6 FOLFIRI with  liposomal irinotecan  08/13/2023 CTs 08/20/2023-numerous cavitary lesions seen in the lungs, extent and distribution appear similar.  A few lesions are slightly smaller.  Numerous liver lesions are now smaller.  Stable right adrenal nodule, adenoma favored. Cycle 7 FOLFIRI with liposomal irinotecan  08/27/2023 Cycle 8 FOLFIRI with liposomal irinotecan  09/11/2023 Cycle 9 FOLFIRI with liposomal irinotecan  09/24/2023 Cycle 10 FOLFIRI with liposomal irinotecan  10/08/2023 Cycle 11 FOLFIRI with liposomal irinotecan  10/22/2023 Cycle 12 FOLFIRI with liposomal irinotecan  11/05/2023 CT 11/17/2023-majority of multiple irregular thick-walled cavitary lesions throughout bilateral lungs grossly unchanged or slightly decreased in size.  Redemonstration of peripheral wedge-shaped ill-defined hypoattenuating areas in the liver suggesting treatment response.  No discrete new suspicious liver lesions seen.  No new metastatic disease. Cycle 13 FOLFIRI with liposomal irinotecan  11/19/2023 Cycle 14 FOLFIRI with liposomal irinotecan  12/03/2023 Cycle 15 FOLFIRI with liposomal irinotecan  12/24/2023 Cycle 16 FOLFIRI with liposomal irinotecan  01/14/2024 02/04/2024 treatment held due to elevated LFTs, referred for CTs CTs 02/04/2024-liver lesions less conspicuous.  No significant new lesions identified.  Innumerable scattered cavitary lung nodules bilaterally.  Most are similar but a few demonstrate reduced thickening of the wall or reduced surrounding inflammatory stranding along the margins.  No substantial new nodularity observed. 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  Apixaban  10/24/2022 Port-A-Cath malfunction 08/01/2023.  Port-A-Cath removed and replaced 08/08/2023.       Disposition: Ms. Algeo has metastatic pancreas cancer.  She is tolerating the FOLFIRI/liposomal irinotecan  well.  There is no clinical evidence of disease progression.  The CA 19-9 was higher when she was here 3 weeks ago.  We will follow-up on the CA 19-9 from today.  The liver enzymes have been mildly elevated.  This is likely secondary to toxicity from chemotherapy.  Will follow-up on the liver panel from today.  The neutrophil count is mildly low.  She has been treated  with a neutrophil count at this level in the past.  She will call for a fever or symptoms of infection.  Ms. Ragen will return for an office visit and chemotherapy in 3 weeks.  We discussed treatment options when she develops disease progression on the current regimen.  We can consider going back to oxaliplatin  based therapy and we will investigate clinical trial options.  We will repeat NGS testing when there is disease progression.  She will return for an office visit and chemotherapy in 3 weeks.  Arley Hof, MD  04/07/2024  8:42 AM

## 2024-04-07 NOTE — Progress Notes (Deleted)
 Patient seen by {Blank single:19197::Dr. Arley Hof today,Dr. Chinita Pasam today,Lisa Debby NP today,Lacie Ann, NP today,Other today}  Vitals are within treatment parameters:{Blank single:19197::Yes ,No (Please specify and give further instructions.)}  Labs are within treatment parameters: {Blank single:19197::Yes,No (Please specify and give further instructions.)}   Treatment plan has been signed: {Blank single:19197::Yes,No}   Per physician team, {Blank single:19197::Patient is ready for treatment and there are NO modifications to the treatment plan.,Patient is ready for treatment. Please note the following modifications:,Patient will not be receiving treatment today.}

## 2024-04-07 NOTE — Patient Instructions (Signed)
 [facility] - A DEPT OF [facility]  Discharge Instructions: Thank you for choosing [facility] to provide your oncology and hematology care.   If you have a lab appointment with the Cancer Center, please go directly to the Cancer Center and check in at the registration area.   Wear comfortable clothing and clothing appropriate for easy access to any Portacath or PICC line.   We strive to give you quality time with your provider. You may need to reschedule your appointment if you arrive late (15 or more minutes).  Arriving late affects you and other patients whose appointments are after yours.  Also, if you miss three or more appointments without notifying the office, you may be dismissed from the clinic at the provider's discretion.      For prescription refill requests, have your pharmacy contact our office and allow 72 hours for refills to be completed.    Today you received the following chemotherapy and/or immunotherapy agents: irinotecan , leucovorin , fluorouracil .  Fluorouracil  Injection What is this medication? FLUOROURACIL  (flure oh YOOR a sil) treats some types of cancer. It works by slowing down the growth of cancer cells. This medicine may be used for other purposes; ask your health care provider or pharmacist if you have questions. COMMON BRAND NAME(S): Adrucil  What should I tell my care team before I take this medication? They need to know if you have any of these conditions: Blood disorders Dihydropyrimidine dehydrogenase (DPD) deficiency Infection, such as chickenpox, cold sores, herpes Kidney disease Liver disease Poor nutrition Recent or ongoing radiation therapy An unusual or allergic reaction to fluorouracil , other medications, foods, dyes, or preservatives If you or your partner are pregnant or trying to get pregnant Breast-feeding How should I use this medication? This medication is injected into a vein. It is  administered by your care team in a hospital or clinic setting. Talk to your care team about the use of this medication in children. Special care may be needed. Overdosage: If you think you have taken too much of this medicine contact a poison control center or emergency room at once. NOTE: This medicine is only for you. Do not share this medicine with others. What if I miss a dose? Keep appointments for follow-up doses. It is important not to miss your dose. Call your care team if you are unable to keep an appointment. What may interact with this medication? Do not take this medication with any of the following: Live virus vaccines This medication may also interact with the following: Medications that treat or prevent blood clots, such as warfarin, enoxaparin , dalteparin This list may not describe all possible interactions. Give your health care provider a list of all the medicines, herbs, non-prescription drugs, or dietary supplements you use. Also tell them if you smoke, drink alcohol, or use illegal drugs. Some items may interact with your medicine. What should I watch for while using this medication? Your condition will be monitored carefully while you are receiving this medication. This medication may make you feel generally unwell. This is not uncommon as chemotherapy can affect healthy cells as well as cancer cells. Report any side effects. Continue your course of treatment even though you feel ill unless your care team tells you to stop. In some cases, you may be given additional medications to help with side effects. Follow all directions for their use. This medication may increase your risk of getting an infection. Call your care team for advice  if you get a fever, chills, sore throat, or other symptoms of a cold or flu. Do not treat yourself. Try to avoid being around people who are sick. This medication may increase your risk to bruise or bleed. Call your care team if you notice any  unusual bleeding. Be careful brushing or flossing your teeth or using a toothpick because you may get an infection or bleed more easily. If you have any dental work done, tell your dentist you are receiving this medication. Avoid taking medications that contain aspirin, acetaminophen , ibuprofen, naproxen, or ketoprofen unless instructed by your care team. These medications may hide a fever. Do not treat diarrhea with over the counter products. Contact your care team if you have diarrhea that lasts more than 2 days or if it is severe and watery. This medication can make you more sensitive to the sun. Keep out of the sun. If you cannot avoid being in the sun, wear protective clothing and sunscreen. Do not use sun lamps, tanning beds, or tanning booths. Talk to your care team if you or your partner wish to become pregnant or think you might be pregnant. This medication can cause serious birth defects if taken during pregnancy and for 3 months after the last dose. A reliable form of contraception is recommended while taking this medication and for 3 months after the last dose. Talk to your care team about effective forms of contraception. Do not father a child while taking this medication and for 3 months after the last dose. Use a condom while having sex during this time period. Do not breastfeed while taking this medication. This medication may cause infertility. Talk to your care team if you are concerned about your fertility. What side effects may I notice from receiving this medication? Side effects that you should report to your care team as soon as possible: Allergic reactions--skin rash, itching, hives, swelling of the face, lips, tongue, or throat Heart attack--pain or tightness in the chest, shoulders, arms, or jaw, nausea, shortness of breath, cold or clammy skin, feeling faint or lightheaded Heart failure--shortness of breath, swelling of the ankles, feet, or hands, sudden weight gain, unusual  weakness or fatigue Heart rhythm changes--fast or irregular heartbeat, dizziness, feeling faint or lightheaded, chest pain, trouble breathing High ammonia level--unusual weakness or fatigue, confusion, loss of appetite, nausea, vomiting, seizures Infection--fever, chills, cough, sore throat, wounds that don't heal, pain or trouble when passing urine, general feeling of discomfort or being unwell Low red blood cell level--unusual weakness or fatigue, dizziness, headache, trouble breathing Pain, tingling, or numbness in the hands or feet, muscle weakness, change in vision, confusion or trouble speaking, loss of balance or coordination, trouble walking, seizures Redness, swelling, and blistering of the skin over hands and feet Severe or prolonged diarrhea Unusual bruising or bleeding Side effects that usually do not require medical attention (report to your care team if they continue or are bothersome): Dry skin Headache Increased tears Nausea Pain, redness, or swelling with sores inside the mouth or throat Sensitivity to light Vomiting This list may not describe all possible side effects. Call your doctor for medical advice about side effects. You may report side effects to FDA at 1-800-FDA-1088. Where should I keep my medication? This medication is given in a hospital or clinic. It will not be stored at home. NOTE: This sheet is a summary. It may not cover all possible information. If you have questions about this medicine, talk to your doctor, pharmacist, or health care  provider.  2024 Elsevier/Gold Standard (2021-10-02 00:00:00)  Leucovorin  Injection What is this medication? LEUCOVORIN  (loo koe VOR in) prevents side effects from certain medications, such as methotrexate. It works by increasing folate levels. This helps protect healthy cells in your body. It may also be used to treat anemia caused by low levels of folate. It can also be used with fluorouracil , a type of chemotherapy, to  treat colorectal cancer. It works by increasing the effects of fluorouracil  in the body. This medicine may be used for other purposes; ask your health care provider or pharmacist if you have questions. What should I tell my care team before I take this medication? They need to know if you have any of these conditions: Anemia from low levels of vitamin B12 in the blood An unusual or allergic reaction to leucovorin , folic acid, other medications, foods, dyes, or preservatives Pregnant or trying to get pregnant Breastfeeding How should I use this medication? This medication is injected into a vein or a muscle. It is given by your care team in a hospital or clinic setting. Talk to your care team about the use of this medication in children. Special care may be needed. Overdosage: If you think you have taken too much of this medicine contact a poison control center or emergency room at once. NOTE: This medicine is only for you. Do not share this medicine with others. What if I miss a dose? Keep appointments for follow-up doses. It is important not to miss your dose. Call your care team if you are unable to keep an appointment. What may interact with this medication? Capecitabine  Fluorouracil  Phenobarbital Phenytoin Primidone Trimethoprim;sulfamethoxazole This list may not describe all possible interactions. Give your health care provider a list of all the medicines, herbs, non-prescription drugs, or dietary supplements you use. Also tell them if you smoke, drink alcohol, or use illegal drugs. Some items may interact with your medicine. What should I watch for while using this medication? Your condition will be monitored carefully while you are receiving this medication. This medication may increase the side effects of 5-fluorouracil . Tell your care team if you have diarrhea or mouth sores that do not get better or that get worse. What side effects may I notice from receiving this  medication? Side effects that you should report to your care team as soon as possible: Allergic reactions--skin rash, itching, hives, swelling of the face, lips, tongue, or throat This list may not describe all possible side effects. Call your doctor for medical advice about side effects. You may report side effects to FDA at 1-800-FDA-1088. Where should I keep my medication? This medication is given in a hospital or clinic. It will not be stored at home. NOTE: This sheet is a summary. It may not cover all possible information. If you have questions about this medicine, talk to your doctor, pharmacist, or health care provider.  2024 Elsevier/Gold Standard (2021-10-30 00:00:00)  Irinotecan  Injection What is this medication? IRINOTECAN  (ir in oh TEE kan) treats some types of cancer. It works by slowing down the growth of cancer cells. This medicine may be used for other purposes; ask your health care provider or pharmacist if you have questions. COMMON BRAND NAME(S): Camptosar  What should I tell my care team before I take this medication? They need to know if you have any of these conditions: Dehydration Diarrhea Infection, especially a viral infection, such as chickenpox, cold sores, herpes Liver disease Low blood cell levels (white cells, red cells, and platelets)  Low levels of electrolytes, such as calcium , magnesium , or potassium in your blood Recent or ongoing radiation An unusual or allergic reaction to irinotecan , other medications, foods, dyes, or preservatives If you or your partner are pregnant or trying to get pregnant Breast-feeding How should I use this medication? This medication is injected into a vein. It is given by your care team in a hospital or clinic setting. Talk to your care team about the use of this medication in children. Special care may be needed. Overdosage: If you think you have taken too much of this medicine contact a poison control center or emergency room  at once. NOTE: This medicine is only for you. Do not share this medicine with others. What if I miss a dose? Keep appointments for follow-up doses. It is important not to miss your dose. Call your care team if you are unable to keep an appointment. What may interact with this medication? Do not take this medication with any of the following: Cobicistat Itraconazole This medication may also interact with the following: Certain antibiotics, such as clarithromycin, rifampin, rifabutin Certain antivirals for HIV or AIDS Certain medications for fungal infections, such as ketoconazole, posaconazole, voriconazole Certain medications for seizures, such as carbamazepine, phenobarbital, phenytoin Gemfibrozil Nefazodone St. John's wort This list may not describe all possible interactions. Give your health care provider a list of all the medicines, herbs, non-prescription drugs, or dietary supplements you use. Also tell them if you smoke, drink alcohol, or use illegal drugs. Some items may interact with your medicine. What should I watch for while using this medication? Your condition will be monitored carefully while you are receiving this medication. You may need blood work while taking this medication. This medication may make you feel generally unwell. This is not uncommon as chemotherapy can affect healthy cells as well as cancer cells. Report any side effects. Continue your course of treatment even though you feel ill unless your care team tells you to stop. This medication can cause serious side effects. To reduce the risk, your care team may give you other medications to take before receiving this one. Be sure to follow the directions from your care team. This medication may affect your coordination, reaction time, or judgement. Do not drive or operate machinery until you know how this medication affects you. Sit up or stand slowly to reduce the risk of dizzy or fainting spells. Drinking alcohol  with this medication can increase the risk of these side effects. This medication may increase your risk of getting an infection. Call your care team for advice if you get a fever, chills, sore throat, or other symptoms of a cold or flu. Do not treat yourself. Try to avoid being around people who are sick. Avoid taking medications that contain aspirin, acetaminophen , ibuprofen, naproxen, or ketoprofen unless instructed by your care team. These medications may hide a fever. This medication may increase your risk to bruise or bleed. Call your care team if you notice any unusual bleeding. Be careful brushing or flossing your teeth or using a toothpick because you may get an infection or bleed more easily. If you have any dental work done, tell your dentist you are receiving this medication. Talk to your care team if you or your partner are pregnant or think either of you might be pregnant. This medication can cause serious birth defects if taken during pregnancy and for 6 months after the last dose. You will need a negative pregnancy test before starting this medication.  Contraception is recommended while taking this medication and for 6 months after the last dose. Your care team can help you find the option that works for you. Do not father a child while taking this medication and for 3 months after the last dose. Use a condom for contraception during this time period. Do not breastfeed while taking this medication and for 7 days after the last dose. This medication may cause infertility. Talk to your care team if you are concerned about your fertility. What side effects may I notice from receiving this medication? Side effects that you should report to your care team as soon as possible: Allergic reactions--skin rash, itching, hives, swelling of the face, lips, tongue, or throat Dry cough, shortness of breath or trouble breathing Increased saliva or tears, increased sweating, stomach cramping, diarrhea,  small pupils, unusual weakness or fatigue, slow heartbeat Infection--fever, chills, cough, sore throat, wounds that don't heal, pain or trouble when passing urine, general feeling of discomfort or being unwell Kidney injury--decrease in the amount of urine, swelling of the ankles, hands, or feet Low red blood cell level--unusual weakness or fatigue, dizziness, headache, trouble breathing Severe or prolonged diarrhea Unusual bruising or bleeding Side effects that usually do not require medical attention (report to your care team if they continue or are bothersome): Constipation Diarrhea Hair loss Loss of appetite Nausea Stomach pain This list may not describe all possible side effects. Call your doctor for medical advice about side effects. You may report side effects to FDA at 1-800-FDA-1088. Where should I keep my medication? This medication is given in a hospital or clinic. It will not be stored at home. NOTE: This sheet is a summary. It may not cover all possible information. If you have questions about this medicine, talk to your doctor, pharmacist, or health care provider.  2024 Elsevier/Gold Standard (2021-10-08 00:00:00)      To help prevent nausea and vomiting after your treatment, we encourage you to take your nausea medication as directed.  BELOW ARE SYMPTOMS THAT SHOULD BE REPORTED IMMEDIATELY: *FEVER GREATER THAN 100.4 F (38 C) OR HIGHER *CHILLS OR SWEATING *NAUSEA AND VOMITING THAT IS NOT CONTROLLED WITH YOUR NAUSEA MEDICATION *UNUSUAL SHORTNESS OF BREATH *UNUSUAL BRUISING OR BLEEDING *URINARY PROBLEMS (pain or burning when urinating, or frequent urination) *BOWEL PROBLEMS (unusual diarrhea, constipation, pain near the anus) TENDERNESS IN MOUTH AND THROAT WITH OR WITHOUT PRESENCE OF ULCERS (sore throat, sores in mouth, or a toothache) UNUSUAL RASH, SWELLING OR PAIN  UNUSUAL VAGINAL DISCHARGE OR ITCHING   Items with * indicate a potential emergency and should be  followed up as soon as possible or go to the Emergency Department if any problems should occur.  Please show the CHEMOTHERAPY ALERT CARD or IMMUNOTHERAPY ALERT CARD at check-in to the Emergency Department and triage nurse.  Should you have questions after your visit or need to cancel or reschedule your appointment, please contact [facility] - A DEPT OF [facility]  Dept: [phone]  and follow the prompts.  Office hours are 8:00 a.m. to 4:30 p.m. Monday - Friday. Please note that voicemails left after 4:00 p.m. may not be returned until the following business day.  We are closed weekends and major holidays. You have access to a nurse at all times for urgent questions. Please call the main number to the clinic Dept: [phone] and follow the prompts.   For any non-urgent questions, you may also contact your provider using MyChart. We now  offer e-Visits for anyone 47 and older to request care online for non-urgent symptoms. For details visit [email or web address].   Also download the MyChart app! Go to the app store, search MyChart, open the app, select [facility], and log in with your MyChart username and password.

## 2024-04-08 ENCOUNTER — Encounter: Payer: Self-pay | Admitting: Oncology

## 2024-04-08 LAB — CANCER ANTIGEN 19-9: CA 19-9: 227 U/mL — ABNORMAL HIGH (ref 0–35)

## 2024-04-09 ENCOUNTER — Inpatient Hospital Stay

## 2024-04-09 NOTE — Patient Instructions (Signed)
 [facility]  Discharge Instructions: Thank you for choosing [facility] to provide your oncology and hematology care.   If you have a lab appointment with the Cancer Center, please go directly to the Cancer Center and check in at the registration area.   Wear comfortable clothing and clothing appropriate for easy access to any Portacath or PICC line.   We strive to give you quality time with your provider. You may need to reschedule your appointment if you arrive late (15 or more minutes).  Arriving late affects you and other patients whose appointments are after yours.  Also, if you miss three or more appointments without notifying the office, you may be dismissed from the clinic at the provider's discretion.      For prescription refill requests, have your pharmacy contact our office and allow 72 hours for refills to be completed.    Today you received the following Pump Stop.    To help prevent nausea and vomiting after your treatment, we encourage you to take your nausea medication as directed.  BELOW ARE SYMPTOMS THAT SHOULD BE REPORTED IMMEDIATELY: *FEVER GREATER THAN 100.4 F (38 C) OR HIGHER *CHILLS OR SWEATING *NAUSEA AND VOMITING THAT IS NOT CONTROLLED WITH YOUR NAUSEA MEDICATION *UNUSUAL SHORTNESS OF BREATH *UNUSUAL BRUISING OR BLEEDING *URINARY PROBLEMS (pain or burning when urinating, or frequent urination) *BOWEL PROBLEMS (unusual diarrhea, constipation, pain near the anus) TENDERNESS IN MOUTH AND THROAT WITH OR WITHOUT PRESENCE OF ULCERS (sore throat, sores in mouth, or a toothache) UNUSUAL RASH, SWELLING OR PAIN  UNUSUAL VAGINAL DISCHARGE OR ITCHING   Items with * indicate a potential emergency and should be followed up as soon as possible or go to the Emergency Department if any problems should occur.  Please show the CHEMOTHERAPY ALERT CARD or IMMUNOTHERAPY ALERT CARD at check-in to the Emergency  Department and triage nurse.  Should you have questions after your visit or need to cancel or reschedule your appointment, please contact [facility]  Dept: [phone]  and follow the prompts.  Office hours are 8:00 a.m. to 4:30 p.m. Monday - Friday. Please note that voicemails left after 4:00 p.m. may not be returned until the following business day.  We are closed weekends and major holidays. You have access to a nurse at all times for urgent questions. Please call the main number to the clinic Dept: [phone] and follow the prompts.   For any non-urgent questions, you may also contact your provider using MyChart. We now offer e-Visits for anyone 68 and older to request care online for non-urgent symptoms. For details visit [email or web address].   Also download the MyChart app! Go to the app store, search "MyChart", open the app, select [place], and log in with your MyChart username and password.

## 2024-04-09 NOTE — Progress Notes (Signed)
 Patient presents today for pump stop. Pump removed without issue. Patients port flushed without difficulty.  Good blood return noted with no bruising or swelling noted at site.  Band aid applied.  VSS with discharge and left in satisfactory condition with no s/s of distress noted.

## 2024-04-13 ENCOUNTER — Telehealth: Payer: Self-pay | Admitting: Nurse Practitioner

## 2024-04-13 NOTE — Telephone Encounter (Signed)
 I left voicemail for patient regarding scheduled palliative care appointment on 04/29/2024 per staff message. I advised patient to give me a call back if time/date does not work for her.

## 2024-04-20 DIAGNOSIS — C259 Malignant neoplasm of pancreas, unspecified: Secondary | ICD-10-CM | POA: Diagnosis not present

## 2024-04-28 ENCOUNTER — Inpatient Hospital Stay: Attending: Genetic Counselor

## 2024-04-28 ENCOUNTER — Encounter: Payer: Self-pay | Admitting: Nurse Practitioner

## 2024-04-28 ENCOUNTER — Inpatient Hospital Stay

## 2024-04-28 ENCOUNTER — Inpatient Hospital Stay (HOSPITAL_BASED_OUTPATIENT_CLINIC_OR_DEPARTMENT_OTHER): Admitting: Nurse Practitioner

## 2024-04-28 VITALS — BP 131/87 | HR 70 | Temp 98.2°F | Resp 18 | Ht 68.0 in | Wt 191.8 lb

## 2024-04-28 DIAGNOSIS — Z85528 Personal history of other malignant neoplasm of kidney: Secondary | ICD-10-CM | POA: Insufficient documentation

## 2024-04-28 DIAGNOSIS — Z79899 Other long term (current) drug therapy: Secondary | ICD-10-CM | POA: Diagnosis not present

## 2024-04-28 DIAGNOSIS — E1140 Type 2 diabetes mellitus with diabetic neuropathy, unspecified: Secondary | ICD-10-CM | POA: Diagnosis not present

## 2024-04-28 DIAGNOSIS — C3411 Malignant neoplasm of upper lobe, right bronchus or lung: Secondary | ICD-10-CM | POA: Insufficient documentation

## 2024-04-28 DIAGNOSIS — Z5111 Encounter for antineoplastic chemotherapy: Secondary | ICD-10-CM | POA: Insufficient documentation

## 2024-04-28 DIAGNOSIS — C250 Malignant neoplasm of head of pancreas: Secondary | ICD-10-CM

## 2024-04-28 DIAGNOSIS — C7801 Secondary malignant neoplasm of right lung: Secondary | ICD-10-CM | POA: Diagnosis not present

## 2024-04-28 DIAGNOSIS — C7802 Secondary malignant neoplasm of left lung: Secondary | ICD-10-CM | POA: Diagnosis not present

## 2024-04-28 DIAGNOSIS — I10 Essential (primary) hypertension: Secondary | ICD-10-CM | POA: Insufficient documentation

## 2024-04-28 DIAGNOSIS — C259 Malignant neoplasm of pancreas, unspecified: Secondary | ICD-10-CM | POA: Diagnosis not present

## 2024-04-28 LAB — CBC WITH DIFFERENTIAL (CANCER CENTER ONLY)
Abs Immature Granulocytes: 0.01 K/uL (ref 0.00–0.07)
Basophils Absolute: 0 K/uL (ref 0.0–0.1)
Basophils Relative: 1 %
Eosinophils Absolute: 0.1 K/uL (ref 0.0–0.5)
Eosinophils Relative: 3 %
HCT: 30.7 % — ABNORMAL LOW (ref 36.0–46.0)
Hemoglobin: 9.9 g/dL — ABNORMAL LOW (ref 12.0–15.0)
Immature Granulocytes: 0 %
Lymphocytes Relative: 30 %
Lymphs Abs: 1 K/uL (ref 0.7–4.0)
MCH: 32.1 pg (ref 26.0–34.0)
MCHC: 32.2 g/dL (ref 30.0–36.0)
MCV: 99.7 fL (ref 80.0–100.0)
Monocytes Absolute: 0.4 K/uL (ref 0.1–1.0)
Monocytes Relative: 12 %
Neutro Abs: 1.9 K/uL (ref 1.7–7.7)
Neutrophils Relative %: 54 %
Platelet Count: 394 K/uL (ref 150–400)
RBC: 3.08 MIL/uL — ABNORMAL LOW (ref 3.87–5.11)
RDW: 13.3 % (ref 11.5–15.5)
WBC Count: 3.4 K/uL — ABNORMAL LOW (ref 4.0–10.5)
nRBC: 0 % (ref 0.0–0.2)

## 2024-04-28 LAB — CMP (CANCER CENTER ONLY)
ALT: 47 U/L — ABNORMAL HIGH (ref 0–44)
AST: 46 U/L — ABNORMAL HIGH (ref 15–41)
Albumin: 3.2 g/dL — ABNORMAL LOW (ref 3.5–5.0)
Alkaline Phosphatase: 536 U/L — ABNORMAL HIGH (ref 38–126)
Anion gap: 6 (ref 5–15)
BUN: 14 mg/dL (ref 6–20)
CO2: 27 mmol/L (ref 22–32)
Calcium: 9.1 mg/dL (ref 8.9–10.3)
Chloride: 108 mmol/L (ref 98–111)
Creatinine: 0.84 mg/dL (ref 0.44–1.00)
GFR, Estimated: >60 mL/min (ref >60)
Glucose, Bld: 108 mg/dL — ABNORMAL HIGH (ref 70–99)
Potassium: 3.8 mmol/L (ref 3.5–5.1)
Sodium: 141 mmol/L (ref 135–145)
Total Bilirubin: 0.7 mg/dL (ref 0.0–1.2)
Total Protein: 6.9 g/dL (ref 6.5–8.1)

## 2024-04-28 MED ORDER — SODIUM CHLORIDE 0.9 % IV SOLN
55.0000 mg/m2 | Freq: Once | INTRAVENOUS | Status: AC
  Administered 2024-04-28: 107.5 mg via INTRAVENOUS
  Filled 2024-04-28: qty 25

## 2024-04-28 MED ORDER — ATROPINE SULFATE 1 MG/ML IV SOLN
0.5000 mg | Freq: Once | INTRAVENOUS | Status: AC | PRN
  Administered 2024-04-28: 0.5 mg via INTRAVENOUS
  Filled 2024-04-28: qty 1

## 2024-04-28 MED ORDER — SODIUM CHLORIDE 0.9 % IV SOLN
2400.0000 mg/m2 | INTRAVENOUS | Status: DC
  Administered 2024-04-28: 5000 mg via INTRAVENOUS
  Filled 2024-04-28: qty 100

## 2024-04-28 MED ORDER — SODIUM CHLORIDE 0.9 % IV SOLN: INTRAVENOUS | Status: DC

## 2024-04-28 MED ORDER — PALONOSETRON HCL INJECTION 0.25 MG/5ML
0.2500 mg | Freq: Once | INTRAVENOUS | Status: AC
  Administered 2024-04-28: 0.25 mg via INTRAVENOUS
  Filled 2024-04-28: qty 5

## 2024-04-28 MED ORDER — SODIUM CHLORIDE 0.9 % IV SOLN
400.0000 mg/m2 | Freq: Once | INTRAVENOUS | Status: AC
  Administered 2024-04-28: 776 mg via INTRAVENOUS
  Filled 2024-04-28: qty 38.8

## 2024-04-28 MED ORDER — DEXAMETHASONE SODIUM PHOSPHATE 10 MG/ML IJ SOLN
10.0000 mg | Freq: Once | INTRAMUSCULAR | Status: AC
  Administered 2024-04-28: 10 mg via INTRAVENOUS

## 2024-04-28 NOTE — Progress Notes (Signed)
 [facility] OFFICE PROGRESS NOTE   Diagnosis: Pancreas cancer  INTERVAL HISTORY:   [name] returns as scheduled.  She completed another cycle of FOLFIRI with liposomal irinotecan  04/07/2024.  She denies nausea/vomiting.  No mouth sores.  She had a single episode of diarrhea which she feels was related to her diet.  No hand or foot pain or redness.  Objective:  Vital signs in last 24 hours:  Blood pressure 131/87, pulse 70, temperature 98.2 F (36.8 C), temperature source Oral, resp. rate 18, height 5' 8 (1.727 m), weight 191 lb 12.8 oz (87 kg), last menstrual period 09/17/2012, SpO2 100%.    HEENT: No thrush or ulcers. Resp: Lungs clear bilaterally. Cardio: Regular rate and rhythm. GI: No hepatosplenomegaly.  Nontender. Vascular: No leg edema. Skin: Palms without erythema. Port-A-Cath without erythema.  Lab Results:  Lab Results  Component Value Date   WBC 3.4 (L) 04/28/2024   HGB 9.9 (L) 04/28/2024   HCT 30.7 (L) 04/28/2024   MCV 99.7 04/28/2024   PLT 394 04/28/2024   NEUTROABS 1.9 04/28/2024    Imaging:  No results found.  Medications: I have reviewed the patient's current medications.  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 Tumor invades the duodenal wall and vascular groove, resection margins negative, 4/34 lymph nodes positive MSI-stable, tumor showed instability in 2 loci as did adjacent normal tissue Foundation 1-K-ras G12 V, microsatellite status and tumor mutation burden cannot be determined EUS FNA biopsy of pancreas mass on 07/03/2018-well-differentiated neuroendocrine tumor CTs 01/29/2019-ill-defined pancreas head mass, 5 pulmonary nodules-1 with a small amount of central cavitation, tumor abuts the left margin of the portal vein indistinct density surrounding, hepatic artery, complex cystic lesion of the right kidney, right adrenal mass-characterized as an adenoma on a [facility]  MRI 12/21/2018 [facility]  03/03/2019-no focal pancreas activity, no tracer accumulation within the suspicious pulmonary nodules, left uterine mass with tracer accumulation felt to represent a leiomyoma Elevated preoperative CA 19-9--CA 19-9 186 on 01/18/2019 CT chest 04/16/2019-multiple bilateral pulmonary nodules, some with increased cavitation, stable in size Cycle 1 FOLFIRINOX 04/27/2019 Cycle 2 FOLFIRINOX 05/11/2019 Cycle 3 FOLFIRINOX 05/23/2019 Cycle 4 FOLFIRINOX 06/08/2019 Cycle 5 FOLFIRINOX 06/22/2019 CT chest 07/02/2019-stable size of bilateral pulmonary nodules.  Dominant cavitary lesions in both lungs show increased cavitation with thinner walls.  Stable 2.1 cm right adrenal nodule. Cycle 6 FOLFIRINOX 07/06/2019 Cycle 7 FOLFIRINOX 07/21/2019 Cycle 8 FOLFIRINOX 08/03/2019, oxaliplatin  deleted secondary to neuropathy CT chest 08/24/2019-decreased size of several lung nodules with resolution of a left upper lobe nodule, no new nodules Radiation to the pancreas surgical area with concurrent Xeloda  09/13/2019-10/20/2019  CTs 11/29/2019-multiple small pulmonary nodules scattered throughout the lungs bilaterally, appear increased in number and size. No definite evidence of metastatic disease in the abdomen or pelvis. Markedly enlarged and heterogeneous appearing uterus, likely to represent multifocal fibroids. 1 of these lesions appears to be an exophytic subserosal fibroid in the posterior lateral aspect of the uterine body on the left side although this comes in close proximity to the left adnexa such that a primary ovarian lesion is difficult to completely exclude. CTs 02/07/2020-slight enlargement of bilateral lung nodules, some are cavitary, no evidence of metastatic disease in the abdomen or pelvis, stable right adrenal nodule, uterine fibroids CTs 04/26/2020-mild enlargement of pulmonary nodules, slight increase in trace pelvic fluid, new soft tissue thickening inferior to the cecal tip suspicious for  peritoneal metastasis CT 05/26/2020-improved appearance of soft tissue at the inferior tip of the cecum, mildly  thickened short appendix-findings suggestive of resolving appendicitis, stable small bibasilar pulmonary nodules, fibroids Plan biopsy of right cecal tip soft tissue canceled secondary to radiologic improvement CT chest 08/02/2020-enlargement and progressive cavitation of multiple bilateral lung nodules.  Some new nodules are present. CTs 10/24/2020- increase in size of pulmonary nodules, no new nodules, no evidence of metastatic disease in the abdomen, stable right adrenal nodule CT 01/09/2021-slight interval enlargement of pulmonary nodules, stable right adrenal nodule Navigation bronchoscopy 01/30/2021-left lower lobe cavitary nodule FNA-adenocarcinoma, brushing-adenocarcinoma.  Left lower lobe lavage-adenocarcinoma.  Right upper lobe brushing and FNA biopsy of cavitary nodule-adenocarcinoma-immunohistochemical profile consistent with pancreas adenocarcinoma Cycle 1 gemcitabine /Abraxane  03/28/2021 Cycle 2 gemcitabine /Abraxane  04/11/2021 Cycle 3 gemcitabine /Abraxane  04/25/2021 Cycle 4 gemcitabine /Abraxane  05/09/2021 Cycle 5 gemcitabine /Abraxane  05/23/2021 CT chest 06/05/2021-interval cavitation of multiple pulmonary nodules, some nodules have decreased in size, no new or enlarging nodules Cycle 6 gemcitabine /Abraxane  06/06/2021 Cycle 7 gemcitabine /Abraxane  06/21/2021 Cycle 8 gemcitabine /Abraxane  07/05/2021 Cycle 9 Gemcitabine /Abraxane  07/19/2021 Cycle 10 gemcitabine  08/01/2021-Abraxane  held secondary to neuropathy CT chest 08/13/2021-mild decrease in size and wall thickness of multiple cavitary nodules, no new or progressive disease in the chest, indeterminate low-attenuation right liver lesions Cycle 11 gemcitabine  08/16/2021-Abraxane  held secondary to neuropathy Cycle 12 gemcitabine  08/30/2021-Abraxane  held secondary to neuropathy Cycle 13 gemcitabine  09/13/2021-Abraxane  held secondary to  neuropathy Cycle 14 gemcitabine  09/27/2021-Abraxane  held secondary to neuropathy Cycle 15 gemcitabine  10/11/2021-Abraxane  held secondary to neuropathy CTs 10/22/2021-no change in multiple cavitary lung nodules, no evidence of disease progression, ill-defined hypodense lesion in the posterior right liver suspicious for metastatic disease Cycle 16 gemcitabine  10/25/2021 Cycle 17 gemcitabine  11/08/2021 Cycle 18 Gemcitabine  11/22/2021 Cycle 19 gemcitabine  12/06/2021 Cycle 20 Gemcitabine  12/20/2021 CT 12/31/2021-mild increase in size of bilateral pulmonary metastases, stable subtle continuation right liver lesions Cycle 20 gemcitabine /Abraxane  01/03/2022 Cycle 21 gemcitabine /Abraxane  01/17/2022 Cycle 22 gemcitabine /Abraxane  01/31/2022 Cycle 23 gemcitabine /Abraxane  02/14/2022 Cycle 24 gemcitabine /Abraxane  02/28/2022 CTs 03/11/2022-widespread metastatic disease to the lungs again noted with slight involution of several of the pulmonary nodules, no definite new nodules noted; interval cavitation of solid lesion in the posterior aspect right lobe of the liver, no new liver lesions noted. Cycle 25 Gemcitabine /Abraxane  03/14/2022 Cycle 26 gemcitabine /Abraxane  03/28/2022 Cycle 27 gemcitabine /Abraxane  04/25/2022 Cycle 28 gemcitabine /Abraxane  05/09/2022 Cycle 29 Gemcitabine  05/23/2022, Abraxane  held due to neuropathy CTs 06/04/2022-stable multifocal cavitary pulmonary nodules.  No definite new nodules.  No new nodules greater than a centimeter.  Decreased size of the right posterior hepatic lobe lesion.  Generalized heterogeneity and new focal areas of hypodensity in the liver. Cycle 30 Gemcitabine  06/06/2022, Abraxane  held due to neuropathy Cycle 31 gemcitabine  06/20/2022, Abraxane  held due to neuropathy Cycle 32 gemcitabine  07/04/2022, Abraxane  held due to neuropathy Cycle 33 gemcitabine   07/18/2022 ,Abraxane  held due to neuropathy Cycle 34 gemcitabine  08/15/2022, Abraxane  held due to neuropathy Cycle 35 gemcitabine   08/29/2022, Abraxane  held due to neuropathy CTs 09/10/2022-enlarging pulmonary metastases.  Subtle poorly defined hypoattenuating lesions in the liver, less well-seen than on 06/04/2022.  Suspected new lesion in the dome of the right hepatic lobe Cycle 36 gemcitabine /Abraxane  09/12/2022 Cycle 37 gemcitabine /Abraxane  09/26/2022 Cycle 38 gemcitabine /Abraxane  10/10/2022 Cycle 39 gemcitabine /Abraxane  11/07/2022 Cycle 40 gemcitabine /Abraxane  11/21/2022 Cycle 41 gemcitabine /Abraxane  12/05/2022 Cycle 42 gemcitabine /Abraxane  12/19/2022 CT is 12/30/2022-new left hepatic lesion, stable bilateral pulmonary metastases Cycle 43 gemcitabine /Abraxane  01/02/2023 CT-guided biopsy of liver lesion 01/27/2023-acute/chronic inflammation, negative for malignancy, culture negative Cycle 44 gemcitabine /Abraxane  01/30/2023 Cycle 45 gemcitabine /Abraxane  02/13/2023 Cycle 46 gemcitabine /Abraxane  02/27/2023 Cycle 47 gemcitabine /Abraxane  03/13/2023 Cycle 48 gemcitabine /Abraxane  03/27/2023 CTs 04/21/2023-numerous solid and cavitary lung nodules overall similar, some slightly enlarged; multiple new and  enlarging rim-enhancing liver lesions. Cycle 1 FOLFIRI with liposomal irinotecan  05/21/2023 Cycle 2 held 06/05/2023 due to neutropenia Cycle 2 FOLFIRI with liposomal irinotecan  06/18/2023, irinotecan  dose reduced (white cell growth factor declined per patient) Cycle 3 FOLFIRI with liposomal irinotecan  07/02/2023 Cycle 4 FOLFIRI with liposomal irinotecan  07/16/2023 Cycle 5 FOLFIRI with liposomal irinotecan  07/30/2023 Cycle 6 FOLFIRI with liposomal irinotecan  08/13/2023 CTs 08/20/2023-numerous cavitary lesions seen in the lungs, extent and distribution appear similar.  A few lesions are slightly smaller.  Numerous liver lesions are now smaller.  Stable right adrenal nodule, adenoma favored. Cycle 7 FOLFIRI with liposomal irinotecan  08/27/2023 Cycle 8 FOLFIRI with liposomal irinotecan  09/11/2023 Cycle 9 FOLFIRI with liposomal irinotecan  09/24/2023 Cycle 10  FOLFIRI with liposomal irinotecan  10/08/2023 Cycle 11 FOLFIRI with liposomal irinotecan  10/22/2023 Cycle 12 FOLFIRI with liposomal irinotecan  11/05/2023 CT 11/17/2023-majority of multiple irregular thick-walled cavitary lesions throughout bilateral lungs grossly unchanged or slightly decreased in size.  Redemonstration of peripheral wedge-shaped ill-defined hypoattenuating areas in the liver suggesting treatment response.  No discrete new suspicious liver lesions seen.  No new metastatic disease. Cycle 13 FOLFIRI with liposomal irinotecan  11/19/2023 Cycle 14 FOLFIRI with liposomal irinotecan  12/03/2023 Cycle 15 FOLFIRI with liposomal irinotecan  12/24/2023 Cycle 16 FOLFIRI with liposomal irinotecan  01/14/2024 02/04/2024 treatment held due to elevated LFTs, referred for CTs CTs 02/04/2024-liver lesions less conspicuous.  No significant new lesions identified.  Innumerable scattered cavitary lung nodules bilaterally.  Most are similar but a few demonstrate reduced thickening of the wall or reduced surrounding inflammatory stranding along the margins.  No substantial new nodularity observed. 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  Apixaban  10/24/2022 Port-A-Cath malfunction 08/01/2023.  Port-A-Cath removed and replaced 08/08/2023.    Disposition: Alyssa Ross appears stable.  She continues FOLFIRI with liposomal irinotecan  every 3 weeks.  She is tolerating treatment well.  There is no clinical evidence of disease progression.  CA 19-9 higher recently.  Plan to proceed with another cycle of chemotherapy today.  If the CA 19-9 is higher again plan for restaging CTs prior to next office visit.  She agrees with this plan.  CBC and chemistry panel reviewed.  Labs are adequate for treatment.  She will return for follow-up and the next cycle of chemotherapy in 3 weeks.  We are available to see her sooner if needed.    Olam Ned ANP/GNP-BC   04/28/2024  10:05 AM

## 2024-04-28 NOTE — Progress Notes (Signed)
 Patient seen by Olam Ned NP today  Vitals are within treatment parameters:Yes   Labs are within treatment parameters: Yes   Treatment plan has been signed: Yes   Per physician team, Patient is ready for treatment and there are NO modifications to the treatment plan.

## 2024-04-28 NOTE — Patient Instructions (Signed)

## 2024-04-28 NOTE — Patient Instructions (Signed)
 [facility] - A DEPT OF [facility]  Discharge Instructions: Thank you for choosing [facility] to provide your oncology and hematology care.   If you have a lab appointment with the Cancer Center, please go directly to the Cancer Center and check in at the registration area.   Wear comfortable clothing and clothing appropriate for easy access to any Portacath or PICC line.   We strive to give you quality time with your provider. You may need to reschedule your appointment if you arrive late (15 or more minutes).  Arriving late affects you and other patients whose appointments are after yours.  Also, if you miss three or more appointments without notifying the office, you may be dismissed from the clinic at the provider's discretion.      For prescription refill requests, have your pharmacy contact our office and allow 72 hours for refills to be completed.    Today you received the following chemotherapy and/or immunotherapy agents: liposomal irinotecan , leucovorin , fluorouracil .  Fluorouracil  Injection What is this medication? FLUOROURACIL  (flure oh YOOR a sil) treats some types of cancer. It works by slowing down the growth of cancer cells. This medicine may be used for other purposes; ask your health care provider or pharmacist if you have questions. COMMON BRAND NAME(S): Adrucil  What should I tell my care team before I take this medication? They need to know if you have any of these conditions: Blood disorders Dihydropyrimidine dehydrogenase (DPD) deficiency Infection, such as chickenpox, cold sores, herpes Kidney disease Liver disease Poor nutrition Recent or ongoing radiation therapy An unusual or allergic reaction to fluorouracil , other medications, foods, dyes, or preservatives If you or your partner are pregnant or trying to get pregnant Breast-feeding How should I use this medication? This medication is injected into a vein. It  is administered by your care team in a hospital or clinic setting. Talk to your care team about the use of this medication in children. Special care may be needed. Overdosage: If you think you have taken too much of this medicine contact a poison control center or emergency room at once. NOTE: This medicine is only for you. Do not share this medicine with others. What if I miss a dose? Keep appointments for follow-up doses. It is important not to miss your dose. Call your care team if you are unable to keep an appointment. What may interact with this medication? Do not take this medication with any of the following: Live virus vaccines This medication may also interact with the following: Medications that treat or prevent blood clots, such as warfarin, enoxaparin , dalteparin This list may not describe all possible interactions. Give your health care provider a list of all the medicines, herbs, non-prescription drugs, or dietary supplements you use. Also tell them if you smoke, drink alcohol, or use illegal drugs. Some items may interact with your medicine. What should I watch for while using this medication? Your condition will be monitored carefully while you are receiving this medication. This medication may make you feel generally unwell. This is not uncommon as chemotherapy can affect healthy cells as well as cancer cells. Report any side effects. Continue your course of treatment even though you feel ill unless your care team tells you to stop. In some cases, you may be given additional medications to help with side effects. Follow all directions for their use. This medication may increase your risk of getting an infection. Call your care team for  advice if you get a fever, chills, sore throat, or other symptoms of a cold or flu. Do not treat yourself. Try to avoid being around people who are sick. This medication may increase your risk to bruise or bleed. Call your care team if you notice any  unusual bleeding. Be careful brushing or flossing your teeth or using a toothpick because you may get an infection or bleed more easily. If you have any dental work done, tell your dentist you are receiving this medication. Avoid taking medications that contain aspirin, acetaminophen , ibuprofen, naproxen, or ketoprofen unless instructed by your care team. These medications may hide a fever. Do not treat diarrhea with over the counter products. Contact your care team if you have diarrhea that lasts more than 2 days or if it is severe and watery. This medication can make you more sensitive to the sun. Keep out of the sun. If you cannot avoid being in the sun, wear protective clothing and sunscreen. Do not use sun lamps, tanning beds, or tanning booths. Talk to your care team if you or your partner wish to become pregnant or think you might be pregnant. This medication can cause serious birth defects if taken during pregnancy and for 3 months after the last dose. A reliable form of contraception is recommended while taking this medication and for 3 months after the last dose. Talk to your care team about effective forms of contraception. Do not father a child while taking this medication and for 3 months after the last dose. Use a condom while having sex during this time period. Do not breastfeed while taking this medication. This medication may cause infertility. Talk to your care team if you are concerned about your fertility. What side effects may I notice from receiving this medication? Side effects that you should report to your care team as soon as possible: Allergic reactions--skin rash, itching, hives, swelling of the face, lips, tongue, or throat Heart attack--pain or tightness in the chest, shoulders, arms, or jaw, nausea, shortness of breath, cold or clammy skin, feeling faint or lightheaded Heart failure--shortness of breath, swelling of the ankles, feet, or hands, sudden weight gain, unusual  weakness or fatigue Heart rhythm changes--fast or irregular heartbeat, dizziness, feeling faint or lightheaded, chest pain, trouble breathing High ammonia level--unusual weakness or fatigue, confusion, loss of appetite, nausea, vomiting, seizures Infection--fever, chills, cough, sore throat, wounds that don't heal, pain or trouble when passing urine, general feeling of discomfort or being unwell Low red blood cell level--unusual weakness or fatigue, dizziness, headache, trouble breathing Pain, tingling, or numbness in the hands or feet, muscle weakness, change in vision, confusion or trouble speaking, loss of balance or coordination, trouble walking, seizures Redness, swelling, and blistering of the skin over hands and feet Severe or prolonged diarrhea Unusual bruising or bleeding Side effects that usually do not require medical attention (report to your care team if they continue or are bothersome): Dry skin Headache Increased tears Nausea Pain, redness, or swelling with sores inside the mouth or throat Sensitivity to light Vomiting This list may not describe all possible side effects. Call your doctor for medical advice about side effects. You may report side effects to FDA at 1-800-FDA-1088. Where should I keep my medication? This medication is given in a hospital or clinic. It will not be stored at home. NOTE: This sheet is a summary. It may not cover all possible information. If you have questions about this medicine, talk to your doctor, pharmacist, or health  care provider.  2024 Elsevier/Gold Standard (2021-10-02 00:00:00)  Leucovorin  Injection What is this medication? LEUCOVORIN  (loo koe VOR in) prevents side effects from certain medications, such as methotrexate. It works by increasing folate levels. This helps protect healthy cells in your body. It may also be used to treat anemia caused by low levels of folate. It can also be used with fluorouracil , a type of chemotherapy, to  treat colorectal cancer. It works by increasing the effects of fluorouracil  in the body. This medicine may be used for other purposes; ask your health care provider or pharmacist if you have questions. What should I tell my care team before I take this medication? They need to know if you have any of these conditions: Anemia from low levels of vitamin B12 in the blood An unusual or allergic reaction to leucovorin , folic acid, other medications, foods, dyes, or preservatives Pregnant or trying to get pregnant Breastfeeding How should I use this medication? This medication is injected into a vein or a muscle. It is given by your care team in a hospital or clinic setting. Talk to your care team about the use of this medication in children. Special care may be needed. Overdosage: If you think you have taken too much of this medicine contact a poison control center or emergency room at once. NOTE: This medicine is only for you. Do not share this medicine with others. What if I miss a dose? Keep appointments for follow-up doses. It is important not to miss your dose. Call your care team if you are unable to keep an appointment. What may interact with this medication? Capecitabine  Fluorouracil  Phenobarbital Phenytoin Primidone Trimethoprim;sulfamethoxazole This list may not describe all possible interactions. Give your health care provider a list of all the medicines, herbs, non-prescription drugs, or dietary supplements you use. Also tell them if you smoke, drink alcohol, or use illegal drugs. Some items may interact with your medicine. What should I watch for while using this medication? Your condition will be monitored carefully while you are receiving this medication. This medication may increase the side effects of 5-fluorouracil . Tell your care team if you have diarrhea or mouth sores that do not get better or that get worse. What side effects may I notice from receiving this  medication? Side effects that you should report to your care team as soon as possible: Allergic reactions--skin rash, itching, hives, swelling of the face, lips, tongue, or throat This list may not describe all possible side effects. Call your doctor for medical advice about side effects. You may report side effects to FDA at 1-800-FDA-1088. Where should I keep my medication? This medication is given in a hospital or clinic. It will not be stored at home. NOTE: This sheet is a summary. It may not cover all possible information. If you have questions about this medicine, talk to your doctor, pharmacist, or health care provider.  2024 Elsevier/Gold Standard (2021-10-30 00:00:00)  Irinotecan  Injection What is this medication? IRINOTECAN  (ir in oh TEE kan) treats some types of cancer. It works by slowing down the growth of cancer cells. This medicine may be used for other purposes; ask your health care provider or pharmacist if you have questions. COMMON BRAND NAME(S): Camptosar  What should I tell my care team before I take this medication? They need to know if you have any of these conditions: Dehydration Diarrhea Infection, especially a viral infection, such as chickenpox, cold sores, herpes Liver disease Low blood cell levels (white cells, red cells, and  platelets) Low levels of electrolytes, such as calcium , magnesium , or potassium in your blood Recent or ongoing radiation An unusual or allergic reaction to irinotecan , other medications, foods, dyes, or preservatives If you or your partner are pregnant or trying to get pregnant Breast-feeding How should I use this medication? This medication is injected into a vein. It is given by your care team in a hospital or clinic setting. Talk to your care team about the use of this medication in children. Special care may be needed. Overdosage: If you think you have taken too much of this medicine contact a poison control center or emergency room  at once. NOTE: This medicine is only for you. Do not share this medicine with others. What if I miss a dose? Keep appointments for follow-up doses. It is important not to miss your dose. Call your care team if you are unable to keep an appointment. What may interact with this medication? Do not take this medication with any of the following: Cobicistat Itraconazole This medication may also interact with the following: Certain antibiotics, such as clarithromycin, rifampin, rifabutin Certain antivirals for HIV or AIDS Certain medications for fungal infections, such as ketoconazole, posaconazole, voriconazole Certain medications for seizures, such as carbamazepine, phenobarbital, phenytoin Gemfibrozil Nefazodone St. John's wort This list may not describe all possible interactions. Give your health care provider a list of all the medicines, herbs, non-prescription drugs, or dietary supplements you use. Also tell them if you smoke, drink alcohol, or use illegal drugs. Some items may interact with your medicine. What should I watch for while using this medication? Your condition will be monitored carefully while you are receiving this medication. You may need blood work while taking this medication. This medication may make you feel generally unwell. This is not uncommon as chemotherapy can affect healthy cells as well as cancer cells. Report any side effects. Continue your course of treatment even though you feel ill unless your care team tells you to stop. This medication can cause serious side effects. To reduce the risk, your care team may give you other medications to take before receiving this one. Be sure to follow the directions from your care team. This medication may affect your coordination, reaction time, or judgement. Do not drive or operate machinery until you know how this medication affects you. Sit up or stand slowly to reduce the risk of dizzy or fainting spells. Drinking alcohol  with this medication can increase the risk of these side effects. This medication may increase your risk of getting an infection. Call your care team for advice if you get a fever, chills, sore throat, or other symptoms of a cold or flu. Do not treat yourself. Try to avoid being around people who are sick. Avoid taking medications that contain aspirin, acetaminophen , ibuprofen, naproxen, or ketoprofen unless instructed by your care team. These medications may hide a fever. This medication may increase your risk to bruise or bleed. Call your care team if you notice any unusual bleeding. Be careful brushing or flossing your teeth or using a toothpick because you may get an infection or bleed more easily. If you have any dental work done, tell your dentist you are receiving this medication. Talk to your care team if you or your partner are pregnant or think either of you might be pregnant. This medication can cause serious birth defects if taken during pregnancy and for 6 months after the last dose. You will need a negative pregnancy test before starting this  medication. Contraception is recommended while taking this medication and for 6 months after the last dose. Your care team can help you find the option that works for you. Do not father a child while taking this medication and for 3 months after the last dose. Use a condom for contraception during this time period. Do not breastfeed while taking this medication and for 7 days after the last dose. This medication may cause infertility. Talk to your care team if you are concerned about your fertility. What side effects may I notice from receiving this medication? Side effects that you should report to your care team as soon as possible: Allergic reactions--skin rash, itching, hives, swelling of the face, lips, tongue, or throat Dry cough, shortness of breath or trouble breathing Increased saliva or tears, increased sweating, stomach cramping, diarrhea,  small pupils, unusual weakness or fatigue, slow heartbeat Infection--fever, chills, cough, sore throat, wounds that don't heal, pain or trouble when passing urine, general feeling of discomfort or being unwell Kidney injury--decrease in the amount of urine, swelling of the ankles, hands, or feet Low red blood cell level--unusual weakness or fatigue, dizziness, headache, trouble breathing Severe or prolonged diarrhea Unusual bruising or bleeding Side effects that usually do not require medical attention (report to your care team if they continue or are bothersome): Constipation Diarrhea Hair loss Loss of appetite Nausea Stomach pain This list may not describe all possible side effects. Call your doctor for medical advice about side effects. You may report side effects to FDA at 1-800-FDA-1088. Where should I keep my medication? This medication is given in a hospital or clinic. It will not be stored at home. NOTE: This sheet is a summary. It may not cover all possible information. If you have questions about this medicine, talk to your doctor, pharmacist, or health care provider.  2024 Elsevier/Gold Standard (2021-10-08 00:00:00)      To help prevent nausea and vomiting after your treatment, we encourage you to take your nausea medication as directed.  BELOW ARE SYMPTOMS THAT SHOULD BE REPORTED IMMEDIATELY: *FEVER GREATER THAN 100.4 F (38 C) OR HIGHER *CHILLS OR SWEATING *NAUSEA AND VOMITING THAT IS NOT CONTROLLED WITH YOUR NAUSEA MEDICATION *UNUSUAL SHORTNESS OF BREATH *UNUSUAL BRUISING OR BLEEDING *URINARY PROBLEMS (pain or burning when urinating, or frequent urination) *BOWEL PROBLEMS (unusual diarrhea, constipation, pain near the anus) TENDERNESS IN MOUTH AND THROAT WITH OR WITHOUT PRESENCE OF ULCERS (sore throat, sores in mouth, or a toothache) UNUSUAL RASH, SWELLING OR PAIN  UNUSUAL VAGINAL DISCHARGE OR ITCHING   Items with * indicate a potential emergency and should be  followed up as soon as possible or go to the Emergency Department if any problems should occur.  Please show the CHEMOTHERAPY ALERT CARD or IMMUNOTHERAPY ALERT CARD at check-in to the Emergency Department and triage nurse.  Should you have questions after your visit or need to cancel or reschedule your appointment, please contact [facility] - A DEPT OF [facility]  Dept: [phone]  and follow the prompts.  Office hours are 8:00 a.m. to 4:30 p.m. Monday - Friday. Please note that voicemails left after 4:00 p.m. may not be returned until the following business day.  We are closed weekends and major holidays. You have access to a nurse at all times for urgent questions. Please call the main number to the clinic Dept: [phone] and follow the prompts.   For any non-urgent questions, you may also contact your provider using MyChart. We  now offer e-Visits for anyone 34 and older to request care online for non-urgent symptoms. For details visit [email or web address].   Also download the MyChart app! Go to the app store, search MyChart, open the app, select [place], and log in with your MyChart username and password.

## 2024-04-29 ENCOUNTER — Inpatient Hospital Stay: Attending: Genetic Counselor

## 2024-04-29 ENCOUNTER — Encounter: Payer: Self-pay | Admitting: Nurse Practitioner

## 2024-04-29 ENCOUNTER — Inpatient Hospital Stay

## 2024-04-29 DIAGNOSIS — K5900 Constipation, unspecified: Secondary | ICD-10-CM

## 2024-04-29 DIAGNOSIS — C250 Malignant neoplasm of head of pancreas: Secondary | ICD-10-CM

## 2024-04-29 DIAGNOSIS — Z7189 Other specified counseling: Secondary | ICD-10-CM

## 2024-04-29 DIAGNOSIS — Z515 Encounter for palliative care: Secondary | ICD-10-CM

## 2024-04-29 DIAGNOSIS — R530 Neoplastic (malignant) related fatigue: Secondary | ICD-10-CM | POA: Diagnosis not present

## 2024-04-29 LAB — CANCER ANTIGEN 19-9: CA 19-9: 245 U/mL — ABNORMAL HIGH (ref 0–35)

## 2024-04-29 NOTE — Progress Notes (Signed)
 Palliative Medicine [facility]  [phone] [phone]   Name: Alyssa Ross Date: 04/29/2024 MRN: [id number]  DOB: 1965-09-05  Patient Care Team: Jarold Medici, MD as PCP - General (Internal Medicine) Luvenia Morrison, RN (Inactive) as Oncology Nurse Navigator Pickenpack-Cousar, Fannie SAILOR, NP as Nurse Practitioner (Hospice and Palliative Medicine) Jolee Cassius PARAS, [facility] as Pharmacist (Pharmacist)   I connected with Alyssa Ross on 04/29/24 at  9:00 AM EST by Phone and verified that I am speaking with the correct person using two identifiers.   I discussed the limitations, risks, security and privacy concerns of performing an evaluation and management service by telemedicine and the availability of in-person appointments. I also discussed with the patient that there may be a patient responsible charge related to this service. The patient expressed understanding and agreed to proceed.   Other persons participating in the visit and their role in the encounter: N/A   Patient's location: Home  Provider's location: [facility]    INTERVAL HISTORY: Alyssa Ross is a 58 y.o. female with oncologic medical history including pancreatic cancer (01/2019). The patient is 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 and is currently receiving FOLFIRI with liposomal irinotecan . Palliative ask to see for symptom management and goals of care.   SOCIAL HISTORY:     reports that she has never smoked. She has never used smokeless tobacco. She reports that she does not currently use alcohol. She reports that she does not use drugs.  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  MEDICATIONS:  Current Outpatient Medications  Medication Sig Dispense Refill   apixaban  (ELIQUIS ) 5 MG TABS tablet Take 1 tablet (5 mg total) by mouth 2 (two) times daily. 180 tablet 2   cetirizine (ZYRTEC) 10 MG tablet Take 10 mg by mouth daily.     Continuous Blood Gluc Sensor (FREESTYLE LIBRE 2 SENSOR) MISC APPLY EVERY 14 DAYS 2 each 5   glucose blood test strip Check sugar 2 times daily. 100 each 12   hydrOXYzine  (ATARAX ) 10 MG tablet Take 1 tablet (10 mg total) by mouth 3 (three) times daily as needed. 30 tablet 0   Insulin  Degludec (TRESIBA  FLEXTOUCH [place]) Inject into the skin. 12-18 Units     insulin  lispro (HUMALOG  KWIKPEN) 200 UNIT/ML KwikPen Humalog  8 units with low carb meals (or use 1:7 ratio )  14 units with high carb meals (and high carb snacks)  Correction scale Blood sugar 30 minutes if BG  0- 149 no insulin  150 - 199 1 unit  200 - 249 2 units 250 - 299 3 units 300 - 349 4  units 350 - 399 5 units > 400 6 units and contact clinic [phone]     Insulin  Pen Needle (BD PEN NEEDLE NANO U/F) 32G X 4 MM MISC USE TO INJECT INSULIN  THREE TIMES A DAY 270 each 3   lidocaine -prilocaine  (EMLA ) cream Apply 1 Application topically as directed. Apply 1 hour prior to stick and cover with plastic wrap 30 g 5   magnesium  oxide (MAG-OX) 400 (240 Mg) MG tablet TAKE 1 TABLET(400 MG) BY MOUTH TWICE DAILY 180 tablet 1   ondansetron  (ZOFRAN ) 8 MG tablet Take 1 tablet (8 mg total) by mouth every 8 (eight) hours as needed  for nausea or vomiting (do not begin until 72 hours after chemo). 20 tablet 3   Polyethylene Glycol 3350 (MIRALAX PO) Take 17 g by mouth daily. (Patient not taking: Reported on 04/28/2024)     potassium chloride  SA (KLOR-CON  M) 20 MEQ tablet TAKE 1 TABLET(20 MEQ) BY MOUTH FOUR TIMES DAILY 360 tablet 1   prochlorperazine  (COMPAZINE ) 10 MG tablet TAKE 1 TABLET(10 MG) BY MOUTH EVERY 6 HOURS AS NEEDED FOR NAUSEA OR VOMITING (Patient not taking: Reported on 04/28/2024) 60 tablet 2   rosuvastatin  (CRESTOR ) 10 MG tablet Take 1 tablet (10 mg total) by mouth 3 (three) times a week. 90 tablet 3   spironolactone  (ALDACTONE ) 25 MG tablet TAKE 1 TABLET(25 MG) BY MOUTH DAILY 90 tablet 2   valsartan  (DIOVAN ) 160 MG tablet Take 1 tablet (160 mg total) by mouth daily. 90 tablet 3   No current facility-administered medications for this visit.   Facility-Administered Medications Ordered in Other Visits  Medication Dose Route Frequency Provider Last Rate Last Admin   sodium chloride  flush (NS) 0.9 % injection 10 mL  10 mL Intravenous PRN [name], Lisa K, NP   10 mL at 10/24/22 1311    VITAL SIGNS: LMP 09/17/2012  There were no vitals filed for this visit.  Estimated body mass index is 29.16 kg/m as calculated from the following:   Height as of 04/28/24: 5' 8 (1.727 m).   Weight as of 04/28/24: 191 lb 12.8 oz (87 kg).   PERFORMANCE STATUS (ECOG) : 1 - Symptomatic but completely ambulatory  IMPRESSION: Discussed the use of AI scribe software for clinical note transcription with the patient, who gave verbal consent to proceed.  History of Present Illness Alyssa Ross is a 58 year old female who I connected by phone with for follow-up. No acute distress. Taking things one day at a time.  Denies concerns of uncontrolled nausea, vomiting, constipation, or diarrhea.  No pain or discomfort.  She describes a busy lifestyle with work and personal commitments, which she feels is contributing to her fatigue. She  works long hours and has additional responsibilities at [employer] and with family, which limit her rest time. She is considering reducing her work hours in the future to manage her workload and personal life better. We discussed listening to her body and taking rest breaks as needed.   She is experiencing declining hemoglobin levels, which have been trending down, and is concerned about the potential need for blood transfusions, as she has required them in the past. Despite feeling fatigued, she attributes this to overexertion rather than treatment side effects. She maintains her daily activities and work, including ten-hour workdays (some days eight hours), and has increased personal activities such as church commitments and caregiving duties for her sister, which may also contribute to her fatigue. We discussed listening to her body and taking rest  breaks as needed.   Ms. Tout CA 19-9 levels have been trending up over the last two readings. She is hopeful changes in results are attributed the increase in CA 19-9 levels to a disruption in her treatment schedule, having been off schedule for a while, with treatments occurring at intervals of four and a half weeks and then four weeks, instead of the intended three-week schedule. Shares recent visit with Dr. Cloretta who plans to continue close follow-up.   She has concerns about hemorrhoids, which she associates with episodes of constipation. She uses Preparation H and recommended vitamin E but has not started. She experienced rectal bleeding following a bout of diarrhea after consuming a large amount of jelly beans, which she believes activated an internal hemorrhoid. The bleeding has since resolved. She knows to contact medical team if symptoms remain persistent or worsen.   She discusses her diet, noting that her nutritionist has warned her about sugar intake affecting her liver enzymes. She experienced symptoms consistent with elevated liver enzymes, such as  itching, after consuming jelly beans. She is attempting to reduce her sugar intake as it does not 'work well' with her system and affects her diabetes control.  No uncontrolled symptoms at this time. We will continue to support and follow as needed.   Goals of Care Patient has completed advanced directives naming Mondalyn and Analayah Brooke as her medical decision makers in the event she is unable to speak for herself.  No desire for artificial feedings.  No life prolonging measures in the event of a terminal state.  I discussed the importance of continued conversation with family and their medical providers regarding overall plan of care and treatment options, ensuring decisions are within the context of the patients values and GOCs. Assessment & Plan Pancreatic cancer, post-Whipple, on active treatment Pancreatic cancer post-Whipple procedure, currently on active treatment. Recent scans shows some areas have decreased in size in cancer. CA19-9 levels have fluctuated, recently increased, possibly due to treatment schedule disruptions. Treatment is reportedly effective, and she feels well overall. - Continue current cancer treatment regimen - Monitor CA19-9 levels - Review treatment efficacy and side effects at next appointment  Exocrine pancreatic insufficiency symptoms (malabsorption, oily stools, gas) Symptoms of exocrine pancreatic insufficiency include malabsorption, oily stools, and gas. Symptoms have worsened over time, possibly related to treatment and post-Whipple changes. She reports difficulty digesting certain foods, leading to gastrointestinal discomfort and lifestyle impact. - Monitor symptoms and dietary intake - Consider pancreatic enzyme replacement if symptoms persist - Evaluate gastrointestinal symptoms at next appointment  Elevated liver enzymes during cancer treatment Elevated liver enzymes likely treatment-related. AST and alkaline phosphatase levels have decreased slightly  but remain elevated. Bilirubin is within normal range. No significant symptoms reported, though she experiences itching under arms. Dietary modifications suggested by dietitian to reduce sugar and fat intake. - Monitor liver enzyme levels - Implement dietary changes to reduce sugar and fat intake - Evaluate liver function at next appointment per oncology.  Pancreatic cancer under active treatment Pancreatic cancer is being actively treated with chemotherapy. CA 19-9 levels have been trending up, possibly due to a change in treatment schedule. She is on a three-week treatment schedule, which she prefers for better quality of life. There is concern about the effectiveness of the treatment if CA 19-9 levels continue to rise. She is hopeful about the treatment's efficacy but is aware of the need for potential adjustments based on lab results and clinical response. - Continue current chemotherapy schedule  every three weeks per Oncology. - Await CA 19-9 results.  Anemia secondary to antineoplastic chemotherapy Anemia is present, likely secondary to chemotherapy. Hemoglobin levels are trending down, raising concerns about the need for blood transfusions. She reports feeling run down, which may be related to anemia. - Continue to monitor hemoglobin levels closely. - Will consider blood transfusions if hemoglobin levels drop significantly.  Fatigue related to cancer and treatment Fatigue is present, likely related to cancer and treatment. She reports feeling run down but is able to maintain her work schedule. Fatigue may also be exacerbated by overexertion and lack of rest. - Encouraged rest and pacing of activities to manage fatigue. - Advised to discuss with employer about potential reduction in work hours to manage fatigue.  Internal hemorrhoids with intermittent rectal bleeding Intermittent rectal bleeding is likely due to internal hemorrhoids, exacerbated by constipation and recent diarrhea from  consuming jelly beans. She uses Preparation H and has been advised to use vitamin E for management. - Continue using Preparation H as needed. - Consider using vitamin E for hemorrhoid management.  Follow-Up Plan to follow up with her to monitor her condition and address any concerns. - Schedule follow-up phone call in 6 to 8 weeks. - Advise contacting the provider if any issues arise before the scheduled follow-up.  Patient expressed understanding and was in agreement with this plan. She also understands that She can call the clinic at any time with any questions, concerns, or complaints.   I personally spent a total of 40 minutes in the care of the patient today including preparing to see the patient, getting/reviewing separately obtained history, performing a medically appropriate exam/evaluation, counseling and educating, referring and communicating with other health care professionals, documenting clinical information in the EHR, independently interpreting results, communicating results, and coordinating care.  Visit consisted of counseling and education dealing with the complex and emotionally intense issues of symptom management and palliative care in the setting of serious and potentially life-threatening illness.  Levon Borer, AGPCNP-BC  Palliative Medicine Team/[facility]

## 2024-04-30 ENCOUNTER — Inpatient Hospital Stay

## 2024-04-30 NOTE — Progress Notes (Signed)
 Alyssa Ross is in the office today for pump discontinuation.  We discussed the CA 19-9 result from 04/28/2024.  Value is slightly higher but overall not significantly changed.  We decided to hold off on restaging CTs for now.  She will return as scheduled for the next cycle of chemotherapy 05/19/2024.

## 2024-04-30 NOTE — Patient Instructions (Signed)

## 2024-05-03 ENCOUNTER — Telehealth: Payer: Self-pay

## 2024-05-03 ENCOUNTER — Other Ambulatory Visit: Payer: Self-pay

## 2024-05-03 DIAGNOSIS — D3A8 Other benign neuroendocrine tumors: Secondary | ICD-10-CM

## 2024-05-03 NOTE — Telephone Encounter (Signed)
 The patient called to report that she is not feeling well and has developed increased itchiness. She inquired whether she can come in for lab work to evaluate her levels.

## 2024-05-04 ENCOUNTER — Other Ambulatory Visit: Payer: Self-pay | Admitting: *Deleted

## 2024-05-04 ENCOUNTER — Inpatient Hospital Stay

## 2024-05-04 DIAGNOSIS — E860 Dehydration: Secondary | ICD-10-CM

## 2024-05-04 DIAGNOSIS — D3A8 Other benign neuroendocrine tumors: Secondary | ICD-10-CM

## 2024-05-04 DIAGNOSIS — C250 Malignant neoplasm of head of pancreas: Secondary | ICD-10-CM

## 2024-05-04 LAB — CBC WITH DIFFERENTIAL (CANCER CENTER ONLY)
Abs Immature Granulocytes: 0.01 K/uL (ref 0.00–0.07)
Basophils Absolute: 0 K/uL (ref 0.0–0.1)
Basophils Relative: 0 %
Eosinophils Absolute: 0.1 K/uL (ref 0.0–0.5)
Eosinophils Relative: 2 %
HCT: 31.6 % — ABNORMAL LOW (ref 36.0–46.0)
Hemoglobin: 10.4 g/dL — ABNORMAL LOW (ref 12.0–15.0)
Immature Granulocytes: 0 %
Lymphocytes Relative: 24 %
Lymphs Abs: 0.9 K/uL (ref 0.7–4.0)
MCH: 32.3 pg (ref 26.0–34.0)
MCHC: 32.9 g/dL (ref 30.0–36.0)
MCV: 98.1 fL (ref 80.0–100.0)
Monocytes Absolute: 0.2 K/uL (ref 0.1–1.0)
Monocytes Relative: 5 %
Neutro Abs: 2.6 K/uL (ref 1.7–7.7)
Neutrophils Relative %: 69 %
Platelet Count: 385 K/uL (ref 150–400)
RBC: 3.22 MIL/uL — ABNORMAL LOW (ref 3.87–5.11)
RDW: 12.7 % (ref 11.5–15.5)
WBC Count: 3.8 K/uL — ABNORMAL LOW (ref 4.0–10.5)
nRBC: 0 % (ref 0.0–0.2)

## 2024-05-04 LAB — CMP (CANCER CENTER ONLY)
ALT: 49 U/L — ABNORMAL HIGH (ref 0–44)
AST: 26 U/L (ref 15–41)
Albumin: 3.4 g/dL — ABNORMAL LOW (ref 3.5–5.0)
Alkaline Phosphatase: 515 U/L — ABNORMAL HIGH (ref 38–126)
Anion gap: 9 (ref 5–15)
BUN: 15 mg/dL (ref 6–20)
CO2: 26 mmol/L (ref 22–32)
Calcium: 9.1 mg/dL (ref 8.9–10.3)
Chloride: 102 mmol/L (ref 98–111)
Creatinine: 0.76 mg/dL (ref 0.44–1.00)
GFR, Estimated: >60 mL/min (ref >60)
Glucose, Bld: 264 mg/dL — ABNORMAL HIGH (ref 70–99)
Potassium: 3.6 mmol/L (ref 3.5–5.1)
Sodium: 137 mmol/L (ref 135–145)
Total Bilirubin: 1.2 mg/dL (ref 0.0–1.2)
Total Protein: 7.3 g/dL (ref 6.5–8.1)

## 2024-05-04 MED ORDER — SODIUM CHLORIDE 0.9 % IV SOLN: INTRAVENOUS | Status: AC

## 2024-05-04 NOTE — Progress Notes (Signed)
 Patient presents today for port flush with labs due to feeling fatigue and itchy..Patient reports some dizziness upon standing and with moving. Patient denies any nausea or vomiting but reports some diarrhea yesterday. Patient noted to be orthostatic positive. Alyssa Leaven RN made aware and is consulting with Dr Cloretta.

## 2024-05-04 NOTE — Patient Instructions (Signed)

## 2024-05-04 NOTE — Progress Notes (Signed)
 Report taken from Camelia Manila, RN at 1300. Pt has one liter of NS  currently infusing at 500cc/hr via port access.

## 2024-05-05 LAB — CANCER ANTIGEN 19-9: CA 19-9: 285 U/mL — ABNORMAL HIGH (ref 0–35)

## 2024-05-12 ENCOUNTER — Other Ambulatory Visit

## 2024-05-16 ENCOUNTER — Other Ambulatory Visit: Payer: Self-pay | Admitting: Oncology

## 2024-05-19 ENCOUNTER — Inpatient Hospital Stay

## 2024-05-19 ENCOUNTER — Inpatient Hospital Stay: Attending: Genetic Counselor | Admitting: Oncology

## 2024-05-19 ENCOUNTER — Inpatient Hospital Stay: Attending: Genetic Counselor

## 2024-05-19 VITALS — BP 112/87 | HR 71 | Temp 98.1°F | Resp 18 | Ht 68.0 in | Wt 185.9 lb

## 2024-05-19 DIAGNOSIS — Z79899 Other long term (current) drug therapy: Secondary | ICD-10-CM | POA: Diagnosis not present

## 2024-05-19 DIAGNOSIS — Z5111 Encounter for antineoplastic chemotherapy: Secondary | ICD-10-CM | POA: Diagnosis not present

## 2024-05-19 DIAGNOSIS — Z7901 Long term (current) use of anticoagulants: Secondary | ICD-10-CM | POA: Diagnosis not present

## 2024-05-19 DIAGNOSIS — C3432 Malignant neoplasm of lower lobe, left bronchus or lung: Secondary | ICD-10-CM | POA: Insufficient documentation

## 2024-05-19 DIAGNOSIS — C3411 Malignant neoplasm of upper lobe, right bronchus or lung: Secondary | ICD-10-CM | POA: Diagnosis not present

## 2024-05-19 DIAGNOSIS — E1140 Type 2 diabetes mellitus with diabetic neuropathy, unspecified: Secondary | ICD-10-CM | POA: Insufficient documentation

## 2024-05-19 DIAGNOSIS — I10 Essential (primary) hypertension: Secondary | ICD-10-CM | POA: Diagnosis not present

## 2024-05-19 DIAGNOSIS — C250 Malignant neoplasm of head of pancreas: Secondary | ICD-10-CM | POA: Insufficient documentation

## 2024-05-19 DIAGNOSIS — Z86718 Personal history of other venous thrombosis and embolism: Secondary | ICD-10-CM | POA: Insufficient documentation

## 2024-05-19 LAB — CBC WITH DIFFERENTIAL (CANCER CENTER ONLY)
Abs Immature Granulocytes: 0.01 K/uL (ref 0.00–0.07)
Basophils Absolute: 0 K/uL (ref 0.0–0.1)
Basophils Relative: 1 %
Eosinophils Absolute: 0.1 K/uL (ref 0.0–0.5)
Eosinophils Relative: 3 %
HCT: 32.5 % — ABNORMAL LOW (ref 36.0–46.0)
Hemoglobin: 10.8 g/dL — ABNORMAL LOW (ref 12.0–15.0)
Immature Granulocytes: 0 %
Lymphocytes Relative: 41 %
Lymphs Abs: 1.2 K/uL (ref 0.7–4.0)
MCH: 32.7 pg (ref 26.0–34.0)
MCHC: 33.2 g/dL (ref 30.0–36.0)
MCV: 98.5 fL (ref 80.0–100.0)
Monocytes Absolute: 0.5 K/uL (ref 0.1–1.0)
Monocytes Relative: 18 %
Neutro Abs: 1.1 K/uL — ABNORMAL LOW (ref 1.7–7.7)
Neutrophils Relative %: 37 %
Platelet Count: 344 K/uL (ref 150–400)
RBC: 3.3 MIL/uL — ABNORMAL LOW (ref 3.87–5.11)
RDW: 13.1 % (ref 11.5–15.5)
WBC Count: 2.9 K/uL — ABNORMAL LOW (ref 4.0–10.5)
nRBC: 0 % (ref 0.0–0.2)

## 2024-05-19 LAB — CMP (CANCER CENTER ONLY)
ALT: 23 U/L (ref 0–44)
AST: 26 U/L (ref 15–41)
Albumin: 3.4 g/dL — ABNORMAL LOW (ref 3.5–5.0)
Alkaline Phosphatase: 577 U/L — ABNORMAL HIGH (ref 38–126)
Anion gap: 8 (ref 5–15)
BUN: 7 mg/dL (ref 6–20)
CO2: 26 mmol/L (ref 22–32)
Calcium: 9.1 mg/dL (ref 8.9–10.3)
Chloride: 102 mmol/L (ref 98–111)
Creatinine: 1.01 mg/dL — ABNORMAL HIGH (ref 0.44–1.00)
GFR, Estimated: >60 mL/min (ref >60)
Glucose, Bld: 273 mg/dL — ABNORMAL HIGH (ref 70–99)
Potassium: 3.7 mmol/L (ref 3.5–5.1)
Sodium: 136 mmol/L (ref 135–145)
Total Bilirubin: 0.6 mg/dL (ref 0.0–1.2)
Total Protein: 7.2 g/dL (ref 6.5–8.1)

## 2024-05-19 NOTE — Progress Notes (Signed)
 [facility] OFFICE PROGRESS NOTE   Diagnosis: Pancreas cancer  INTERVAL HISTORY:   Alyssa Ross completed another cycle of chemotherapy 04/28/2024.  She had malaise and felt dehydrated 05/04/2024.  She received intravenous fluids.  She continues to have neuropathy symptoms in the feet.  No nausea/vomiting or mouth sores.  Objective:  Vital signs in last 24 hours:  Blood pressure 112/87, pulse 71, temperature 98.1 F (36.7 C), temperature source Temporal, resp. rate 18, height 5' 8 (1.727 m), weight 185 lb 14.4 oz (84.3 kg), last menstrual period 09/17/2012, SpO2 100%.    HEENT: No thrush or ulcers Resp: Lungs clear bilaterally Cardio: Regular rate and rhythm GI: No mass, nontender, no hepatosplenomegaly Vascular: No leg edema  Portacath/PICC-without erythema  Lab Results:  Lab Results  Component Value Date   WBC 2.9 (L) 05/19/2024   HGB 10.8 (L) 05/19/2024   HCT 32.5 (L) 05/19/2024   MCV 98.5 05/19/2024   PLT 344 05/19/2024   NEUTROABS 1.1 (L) 05/19/2024    CMP  Lab Results  Component Value Date   NA 136 05/19/2024   K 3.7 05/19/2024   CL 102 05/19/2024   CO2 26 05/19/2024   GLUCOSE 273 (H) 05/19/2024   BUN 7 05/19/2024   CREATININE 1.01 (H) 05/19/2024   CALCIUM  9.1 05/19/2024   PROT 7.2 05/19/2024   ALBUMIN  3.4 (L) 05/19/2024   AST 26 05/19/2024   ALT 23 05/19/2024   ALKPHOS 577 (H) 05/19/2024   BILITOT 0.6 05/19/2024   GFRNONAA >60 05/19/2024   GFRAA >60 02/07/2020    Lab Results  Component Value Date   CAN199 285 (H) 05/04/2024    Lab Results  Component Value Date   INR 1.0 01/27/2023   LABPROT 13.6 01/27/2023    Imaging:  No results found.  Medications: I have reviewed the patient's current medications.   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 Tumor invades the duodenal wall and vascular groove, resection margins negative, 4/34 lymph nodes  positive MSI-stable, tumor showed instability in 2 loci as did adjacent normal tissue Foundation 1-K-ras G12 V, microsatellite status and tumor mutation burden cannot be determined EUS FNA biopsy of pancreas mass on 07/03/2018-well-differentiated neuroendocrine tumor CTs 01/29/2019-ill-defined pancreas head mass, 5 pulmonary nodules-1 with a small amount of central cavitation, tumor abuts the left margin of the portal vein indistinct density surrounding, hepatic artery, complex cystic lesion of the right kidney, right adrenal mass-characterized as an adenoma on a [facility] MRI 12/21/2018 [facility]  03/03/2019-no focal pancreas activity, no tracer accumulation within the suspicious pulmonary nodules, left uterine mass with tracer accumulation felt to represent a leiomyoma Elevated preoperative CA 19-9--CA 19-9 186 on 01/18/2019 CT chest 04/16/2019-multiple bilateral pulmonary nodules, some with increased cavitation, stable in size Cycle 1 FOLFIRINOX 04/27/2019 Cycle 2 FOLFIRINOX 05/11/2019 Cycle 3 FOLFIRINOX 05/23/2019 Cycle 4 FOLFIRINOX 06/08/2019 Cycle 5 FOLFIRINOX 06/22/2019 CT chest 07/02/2019-stable size of bilateral pulmonary nodules.  Dominant cavitary lesions in both lungs show increased cavitation with thinner walls.  Stable 2.1 cm right adrenal nodule. Cycle 6 FOLFIRINOX 07/06/2019 Cycle 7 FOLFIRINOX 07/21/2019 Cycle 8 FOLFIRINOX 08/03/2019, oxaliplatin  deleted secondary to neuropathy CT chest 08/24/2019-decreased size of several lung nodules with resolution of a left upper lobe nodule, no new nodules Radiation to the pancreas surgical area with concurrent Xeloda  09/13/2019-10/20/2019  CTs 11/29/2019-multiple small pulmonary nodules scattered throughout the lungs bilaterally, appear increased in number and size. No definite evidence of metastatic disease in the abdomen or pelvis. Markedly enlarged and heterogeneous appearing  uterus, likely to represent multifocal fibroids. 1 of these lesions appears to be an  exophytic subserosal fibroid in the posterior lateral aspect of the uterine body on the left side although this comes in close proximity to the left adnexa such that a primary ovarian lesion is difficult to completely exclude. CTs 02/07/2020-slight enlargement of bilateral lung nodules, some are cavitary, no evidence of metastatic disease in the abdomen or pelvis, stable right adrenal nodule, uterine fibroids CTs 04/26/2020-mild enlargement of pulmonary nodules, slight increase in trace pelvic fluid, new soft tissue thickening inferior to the cecal tip suspicious for peritoneal metastasis CT 05/26/2020-improved appearance of soft tissue at the inferior tip of the cecum, mildly thickened short appendix-findings suggestive of resolving appendicitis, stable small bibasilar pulmonary nodules, fibroids Plan biopsy of right cecal tip soft tissue canceled secondary to radiologic improvement CT chest 08/02/2020-enlargement and progressive cavitation of multiple bilateral lung nodules.  Some new nodules are present. CTs 10/24/2020- increase in size of pulmonary nodules, no new nodules, no evidence of metastatic disease in the abdomen, stable right adrenal nodule CT 01/09/2021-slight interval enlargement of pulmonary nodules, stable right adrenal nodule Navigation bronchoscopy 01/30/2021-left lower lobe cavitary nodule FNA-adenocarcinoma, brushing-adenocarcinoma.  Left lower lobe lavage-adenocarcinoma.  Right upper lobe brushing and FNA biopsy of cavitary nodule-adenocarcinoma-immunohistochemical profile consistent with pancreas adenocarcinoma Cycle 1 gemcitabine /Abraxane  03/28/2021 Cycle 2 gemcitabine /Abraxane  04/11/2021 Cycle 3 gemcitabine /Abraxane  04/25/2021 Cycle 4 gemcitabine /Abraxane  05/09/2021 Cycle 5 gemcitabine /Abraxane  05/23/2021 CT chest 06/05/2021-interval cavitation of multiple pulmonary nodules, some nodules have decreased in size, no new or enlarging nodules Cycle 6 gemcitabine /Abraxane   06/06/2021 Cycle 7 gemcitabine /Abraxane  06/21/2021 Cycle 8 gemcitabine /Abraxane  07/05/2021 Cycle 9 Gemcitabine /Abraxane  07/19/2021 Cycle 10 gemcitabine  08/01/2021-Abraxane  held secondary to neuropathy CT chest 08/13/2021-mild decrease in size and wall thickness of multiple cavitary nodules, no new or progressive disease in the chest, indeterminate low-attenuation right liver lesions Cycle 11 gemcitabine  08/16/2021-Abraxane  held secondary to neuropathy Cycle 12 gemcitabine  08/30/2021-Abraxane  held secondary to neuropathy Cycle 13 gemcitabine  09/13/2021-Abraxane  held secondary to neuropathy Cycle 14 gemcitabine  09/27/2021-Abraxane  held secondary to neuropathy Cycle 15 gemcitabine  10/11/2021-Abraxane  held secondary to neuropathy CTs 10/22/2021-no change in multiple cavitary lung nodules, no evidence of disease progression, ill-defined hypodense lesion in the posterior right liver suspicious for metastatic disease Cycle 16 gemcitabine  10/25/2021 Cycle 17 gemcitabine  11/08/2021 Cycle 18 Gemcitabine  11/22/2021 Cycle 19 gemcitabine  12/06/2021 Cycle 20 Gemcitabine  12/20/2021 CT 12/31/2021-mild increase in size of bilateral pulmonary metastases, stable subtle continuation right liver lesions Cycle 20 gemcitabine /Abraxane  01/03/2022 Cycle 21 gemcitabine /Abraxane  01/17/2022 Cycle 22 gemcitabine /Abraxane  01/31/2022 Cycle 23 gemcitabine /Abraxane  02/14/2022 Cycle 24 gemcitabine /Abraxane  02/28/2022 CTs 03/11/2022-widespread metastatic disease to the lungs again noted with slight involution of several of the pulmonary nodules, no definite new nodules noted; interval cavitation of solid lesion in the posterior aspect right lobe of the liver, no new liver lesions noted. Cycle 25 Gemcitabine /Abraxane  03/14/2022 Cycle 26 gemcitabine /Abraxane  03/28/2022 Cycle 27 gemcitabine /Abraxane  04/25/2022 Cycle 28 gemcitabine /Abraxane  05/09/2022 Cycle 29 Gemcitabine  05/23/2022, Abraxane  held due to neuropathy CTs 06/04/2022-stable multifocal cavitary  pulmonary nodules.  No definite new nodules.  No new nodules greater than a centimeter.  Decreased size of the right posterior hepatic lobe lesion.  Generalized heterogeneity and new focal areas of hypodensity in the liver. Cycle 30 Gemcitabine  06/06/2022, Abraxane  held due to neuropathy Cycle 31 gemcitabine  06/20/2022, Abraxane  held due to neuropathy Cycle 32 gemcitabine  07/04/2022, Abraxane  held due to neuropathy Cycle 33 gemcitabine   07/18/2022 ,Abraxane  held due to neuropathy Cycle 34 gemcitabine  08/15/2022, Abraxane  held due to neuropathy Cycle 35 gemcitabine  08/29/2022, Abraxane  held due to neuropathy CTs 09/10/2022-enlarging  pulmonary metastases.  Subtle poorly defined hypoattenuating lesions in the liver, less well-seen than on 06/04/2022.  Suspected new lesion in the dome of the right hepatic lobe Cycle 36 gemcitabine /Abraxane  09/12/2022 Cycle 37 gemcitabine /Abraxane  09/26/2022 Cycle 38 gemcitabine /Abraxane  10/10/2022 Cycle 39 gemcitabine /Abraxane  11/07/2022 Cycle 40 gemcitabine /Abraxane  11/21/2022 Cycle 41 gemcitabine /Abraxane  12/05/2022 Cycle 42 gemcitabine /Abraxane  12/19/2022 CT is 12/30/2022-new left hepatic lesion, stable bilateral pulmonary metastases Cycle 43 gemcitabine /Abraxane  01/02/2023 CT-guided biopsy of liver lesion 01/27/2023-acute/chronic inflammation, negative for malignancy, culture negative Cycle 44 gemcitabine /Abraxane  01/30/2023 Cycle 45 gemcitabine /Abraxane  02/13/2023 Cycle 46 gemcitabine /Abraxane  02/27/2023 Cycle 47 gemcitabine /Abraxane  03/13/2023 Cycle 48 gemcitabine /Abraxane  03/27/2023 CTs 04/21/2023-numerous solid and cavitary lung nodules overall similar, some slightly enlarged; multiple new and enlarging rim-enhancing liver lesions. Cycle 1 FOLFIRI with liposomal irinotecan  05/21/2023 Cycle 2 held 06/05/2023 due to neutropenia Cycle 2 FOLFIRI with liposomal irinotecan  06/18/2023, irinotecan  dose reduced (white cell growth factor declined per patient) Cycle 3 FOLFIRI with liposomal  irinotecan  07/02/2023 Cycle 4 FOLFIRI with liposomal irinotecan  07/16/2023 Cycle 5 FOLFIRI with liposomal irinotecan  07/30/2023 Cycle 6 FOLFIRI with liposomal irinotecan  08/13/2023 CTs 08/20/2023-numerous cavitary lesions seen in the lungs, extent and distribution appear similar.  A few lesions are slightly smaller.  Numerous liver lesions are now smaller.  Stable right adrenal nodule, adenoma favored. Cycle 7 FOLFIRI with liposomal irinotecan  08/27/2023 Cycle 8 FOLFIRI with liposomal irinotecan  09/11/2023 Cycle 9 FOLFIRI with liposomal irinotecan  09/24/2023 Cycle 10 FOLFIRI with liposomal irinotecan  10/08/2023 Cycle 11 FOLFIRI with liposomal irinotecan  10/22/2023 Cycle 12 FOLFIRI with liposomal irinotecan  11/05/2023 CT 11/17/2023-majority of multiple irregular thick-walled cavitary lesions throughout bilateral lungs grossly unchanged or slightly decreased in size.  Redemonstration of peripheral wedge-shaped ill-defined hypoattenuating areas in the liver suggesting treatment response.  No discrete new suspicious liver lesions seen.  No new metastatic disease. Cycle 13 FOLFIRI with liposomal irinotecan  11/19/2023 Cycle 14 FOLFIRI with liposomal irinotecan  12/03/2023 Cycle 15 FOLFIRI with liposomal irinotecan  12/24/2023 Cycle 16 FOLFIRI with liposomal irinotecan  01/14/2024 02/04/2024 treatment held due to elevated LFTs, referred for CTs CTs 02/04/2024-liver lesions less conspicuous.  No significant new lesions identified.  Innumerable scattered cavitary lung nodules bilaterally.  Most are similar but a few demonstrate reduced thickening of the wall or reduced surrounding inflammatory stranding along the margins.  No substantial new nodularity observed. 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  Apixaban  10/24/2022 Port-A-Cath malfunction 08/01/2023.  Port-A-Cath removed and replaced 08/08/2023.     Disposition: Alyssa Ross has metastatic cancer.  She has been maintained on treatment with 5-FU/liposomal irinotecan  since December 2024.  The CA 19-9 has been higher over the past few months.  She will be referred for a restaging CT evaluation prior to an office visit in 2 weeks.  She has mild neutropenia today.  The  next cycle of chemotherapy will be delayed for 1 week.  We discussed potential treatment options at the time of disease progression including clinical trials and recycled oxaliplatin  based therapy.  Arley Hof, MD  05/19/2024  9:32 AM

## 2024-05-19 NOTE — Progress Notes (Signed)
 No treatment today due to ANC 1.1. Will set up lab/flush/tx next week per scheduler.

## 2024-05-19 NOTE — Patient Instructions (Signed)

## 2024-05-20 ENCOUNTER — Telehealth: Payer: Self-pay | Admitting: Oncology

## 2024-05-20 NOTE — Telephone Encounter (Signed)
 PT called to reschedule txt appt, sent message to Nurse. Scheduled CT with PT.

## 2024-05-21 ENCOUNTER — Inpatient Hospital Stay

## 2024-05-24 ENCOUNTER — Inpatient Hospital Stay

## 2024-05-25 ENCOUNTER — Inpatient Hospital Stay

## 2024-05-25 VITALS — BP 128/76 | HR 68 | Temp 98.0°F | Resp 18

## 2024-05-25 DIAGNOSIS — E1140 Type 2 diabetes mellitus with diabetic neuropathy, unspecified: Secondary | ICD-10-CM | POA: Diagnosis not present

## 2024-05-25 DIAGNOSIS — Z79899 Other long term (current) drug therapy: Secondary | ICD-10-CM | POA: Diagnosis not present

## 2024-05-25 DIAGNOSIS — Z5111 Encounter for antineoplastic chemotherapy: Secondary | ICD-10-CM | POA: Diagnosis not present

## 2024-05-25 DIAGNOSIS — C3411 Malignant neoplasm of upper lobe, right bronchus or lung: Secondary | ICD-10-CM | POA: Diagnosis not present

## 2024-05-25 DIAGNOSIS — C250 Malignant neoplasm of head of pancreas: Secondary | ICD-10-CM

## 2024-05-25 DIAGNOSIS — Z7901 Long term (current) use of anticoagulants: Secondary | ICD-10-CM | POA: Diagnosis not present

## 2024-05-25 DIAGNOSIS — I10 Essential (primary) hypertension: Secondary | ICD-10-CM | POA: Diagnosis not present

## 2024-05-25 DIAGNOSIS — Z86718 Personal history of other venous thrombosis and embolism: Secondary | ICD-10-CM | POA: Diagnosis not present

## 2024-05-25 DIAGNOSIS — C3432 Malignant neoplasm of lower lobe, left bronchus or lung: Secondary | ICD-10-CM | POA: Diagnosis not present

## 2024-05-25 LAB — CMP (CANCER CENTER ONLY)
ALT: 54 U/L — ABNORMAL HIGH (ref 0–44)
AST: 62 U/L — ABNORMAL HIGH (ref 15–41)
Albumin: 3.6 g/dL (ref 3.5–5.0)
Alkaline Phosphatase: 554 U/L — ABNORMAL HIGH (ref 38–126)
Anion gap: 8 (ref 5–15)
BUN: 7 mg/dL (ref 6–20)
CO2: 26 mmol/L (ref 22–32)
Calcium: 9.1 mg/dL (ref 8.9–10.3)
Chloride: 105 mmol/L (ref 98–111)
Creatinine: 0.83 mg/dL (ref 0.44–1.00)
GFR, Estimated: >60 mL/min (ref >60)
Glucose, Bld: 205 mg/dL — ABNORMAL HIGH (ref 70–99)
Potassium: 4 mmol/L (ref 3.5–5.1)
Sodium: 139 mmol/L (ref 135–145)
Total Bilirubin: 0.7 mg/dL (ref 0.0–1.2)
Total Protein: 7.3 g/dL (ref 6.5–8.1)

## 2024-05-25 LAB — CBC WITH DIFFERENTIAL (CANCER CENTER ONLY)
Abs Immature Granulocytes: 0.01 K/uL (ref 0.00–0.07)
Basophils Absolute: 0 K/uL (ref 0.0–0.1)
Basophils Relative: 1 %
Eosinophils Absolute: 0.1 K/uL (ref 0.0–0.5)
Eosinophils Relative: 2 %
HCT: 34 % — ABNORMAL LOW (ref 36.0–46.0)
Hemoglobin: 11.1 g/dL — ABNORMAL LOW (ref 12.0–15.0)
Immature Granulocytes: 0 %
Lymphocytes Relative: 20 %
Lymphs Abs: 1 K/uL (ref 0.7–4.0)
MCH: 32.2 pg (ref 26.0–34.0)
MCHC: 32.6 g/dL (ref 30.0–36.0)
MCV: 98.6 fL (ref 80.0–100.0)
Monocytes Absolute: 0.6 K/uL (ref 0.1–1.0)
Monocytes Relative: 11 %
Neutro Abs: 3.2 K/uL (ref 1.7–7.7)
Neutrophils Relative %: 66 %
Platelet Count: 331 K/uL (ref 150–400)
RBC: 3.45 MIL/uL — ABNORMAL LOW (ref 3.87–5.11)
RDW: 13 % (ref 11.5–15.5)
WBC Count: 4.9 K/uL (ref 4.0–10.5)
nRBC: 0 % (ref 0.0–0.2)

## 2024-05-25 MED ORDER — SODIUM CHLORIDE 0.9 % IV SOLN: INTRAVENOUS | Status: DC

## 2024-05-25 MED ORDER — SODIUM CHLORIDE 0.9 % IV SOLN
2400.0000 mg/m2 | INTRAVENOUS | Status: DC
  Administered 2024-05-25: 16:00:00 5000 mg via INTRAVENOUS
  Filled 2024-05-25: qty 100

## 2024-05-25 MED ORDER — DEXAMETHASONE SODIUM PHOSPHATE 10 MG/ML IJ SOLN
10.0000 mg | Freq: Once | INTRAMUSCULAR | Status: AC
  Administered 2024-05-25: 12:00:00 10 mg via INTRAVENOUS

## 2024-05-25 MED ORDER — SODIUM CHLORIDE 0.9 % IV SOLN
55.0000 mg/m2 | Freq: Once | INTRAVENOUS | Status: AC
  Administered 2024-05-25: 13:00:00 107.5 mg via INTRAVENOUS
  Filled 2024-05-25: qty 25

## 2024-05-25 MED ORDER — SODIUM CHLORIDE 0.9 % IV SOLN
400.0000 mg/m2 | Freq: Once | INTRAVENOUS | Status: AC
  Administered 2024-05-25: 15:00:00 776 mg via INTRAVENOUS
  Filled 2024-05-25: qty 38.8

## 2024-05-25 MED ORDER — PALONOSETRON HCL INJECTION 0.25 MG/5ML
0.2500 mg | Freq: Once | INTRAVENOUS | Status: AC
  Administered 2024-05-25: 12:00:00 0.25 mg via INTRAVENOUS
  Filled 2024-05-25: qty 5

## 2024-05-25 MED ORDER — ATROPINE SULFATE 1 MG/ML IV SOLN
0.5000 mg | Freq: Once | INTRAVENOUS | Status: AC | PRN
  Administered 2024-05-25: 13:00:00 0.5 mg via INTRAVENOUS
  Filled 2024-05-25: qty 1

## 2024-05-25 NOTE — Patient Instructions (Signed)

## 2024-05-25 NOTE — Patient Instructions (Signed)
 [facility] - A DEPT OF [place]  Discharge Instructions: Thank you for choosing [facility] to provide your oncology and hematology care.   If you have a lab appointment with the Cancer Center, please go directly to the Cancer Center and check in at the registration area.   Wear comfortable clothing and clothing appropriate for easy access to any Portacath or PICC line.   We strive to give you quality time with your provider. You may need to reschedule your appointment if you arrive late (15 or more minutes).  Arriving late affects you and other patients whose appointments are after yours.  Also, if you miss three or more appointments without notifying the office, you may be dismissed from the clinic at the provider's discretion.      For prescription refill requests, have your pharmacy contact our office and allow 72 hours for refills to be completed.    Today you received the following chemotherapy and/or immunotherapy agents: irinotecan , leucovorin , fluorouracil           To help prevent nausea and vomiting after your treatment, we encourage you to take your nausea medication as directed.  BELOW ARE SYMPTOMS THAT SHOULD BE REPORTED IMMEDIATELY: *FEVER GREATER THAN 100.4 F (38 C) OR HIGHER *CHILLS OR SWEATING *NAUSEA AND VOMITING THAT IS NOT CONTROLLED WITH YOUR NAUSEA MEDICATION *UNUSUAL SHORTNESS OF BREATH *UNUSUAL BRUISING OR BLEEDING *URINARY PROBLEMS (pain or burning when urinating, or frequent urination) *BOWEL PROBLEMS (unusual diarrhea, constipation, pain near the anus) TENDERNESS IN MOUTH AND THROAT WITH OR WITHOUT PRESENCE OF ULCERS (sore throat, sores in mouth, or a toothache) UNUSUAL RASH, SWELLING OR PAIN  UNUSUAL VAGINAL DISCHARGE OR ITCHING   Items with * indicate a potential emergency and should be followed up as soon as possible or go to the Emergency Department if any problems should occur.  Please show the  CHEMOTHERAPY ALERT CARD or IMMUNOTHERAPY ALERT CARD at check-in to the Emergency Department and triage nurse.  Should you have questions after your visit or need to cancel or reschedule your appointment, please contact [facility] - A DEPT OF [facility]  Dept: [phone]  and follow the prompts.  Office hours are 8:00 a.m. to 4:30 p.m. Monday - Friday. Please note that voicemails left after 4:00 p.m. may not be returned until the following business day.  We are closed weekends and major holidays. You have access to a nurse at all times for urgent questions. Please call the main number to the clinic Dept: [phone] and follow the prompts.   For any non-urgent questions, you may also contact your provider using MyChart. We now offer e-Visits for anyone 26 and older to request care online for non-urgent symptoms. For details visit [email or web address].   Also download the MyChart app! Go to the app store, search MyChart, open the app, select [place], and log in with your MyChart username and password.

## 2024-05-26 ENCOUNTER — Other Ambulatory Visit

## 2024-05-26 ENCOUNTER — Inpatient Hospital Stay

## 2024-05-27 ENCOUNTER — Encounter: Payer: Self-pay | Admitting: Oncology

## 2024-05-27 ENCOUNTER — Inpatient Hospital Stay

## 2024-05-27 DIAGNOSIS — Z86718 Personal history of other venous thrombosis and embolism: Secondary | ICD-10-CM | POA: Diagnosis not present

## 2024-05-27 DIAGNOSIS — C3411 Malignant neoplasm of upper lobe, right bronchus or lung: Secondary | ICD-10-CM | POA: Diagnosis not present

## 2024-05-27 DIAGNOSIS — C3432 Malignant neoplasm of lower lobe, left bronchus or lung: Secondary | ICD-10-CM | POA: Diagnosis not present

## 2024-05-27 DIAGNOSIS — Z7901 Long term (current) use of anticoagulants: Secondary | ICD-10-CM | POA: Diagnosis not present

## 2024-05-27 DIAGNOSIS — C250 Malignant neoplasm of head of pancreas: Secondary | ICD-10-CM | POA: Diagnosis not present

## 2024-05-27 DIAGNOSIS — Z5111 Encounter for antineoplastic chemotherapy: Secondary | ICD-10-CM | POA: Diagnosis not present

## 2024-05-27 DIAGNOSIS — E1140 Type 2 diabetes mellitus with diabetic neuropathy, unspecified: Secondary | ICD-10-CM | POA: Diagnosis not present

## 2024-05-27 DIAGNOSIS — Z79899 Other long term (current) drug therapy: Secondary | ICD-10-CM | POA: Diagnosis not present

## 2024-05-27 DIAGNOSIS — I10 Essential (primary) hypertension: Secondary | ICD-10-CM | POA: Diagnosis not present

## 2024-06-01 ENCOUNTER — Inpatient Hospital Stay

## 2024-06-01 ENCOUNTER — Telehealth: Payer: Self-pay | Admitting: *Deleted

## 2024-06-01 ENCOUNTER — Other Ambulatory Visit: Payer: Self-pay | Admitting: *Deleted

## 2024-06-01 ENCOUNTER — Telehealth: Payer: Self-pay | Admitting: Oncology

## 2024-06-01 DIAGNOSIS — C250 Malignant neoplasm of head of pancreas: Secondary | ICD-10-CM

## 2024-06-01 DIAGNOSIS — C3411 Malignant neoplasm of upper lobe, right bronchus or lung: Secondary | ICD-10-CM | POA: Diagnosis not present

## 2024-06-01 DIAGNOSIS — Z86718 Personal history of other venous thrombosis and embolism: Secondary | ICD-10-CM | POA: Diagnosis not present

## 2024-06-01 DIAGNOSIS — I10 Essential (primary) hypertension: Secondary | ICD-10-CM | POA: Diagnosis not present

## 2024-06-01 DIAGNOSIS — E860 Dehydration: Secondary | ICD-10-CM

## 2024-06-01 DIAGNOSIS — Z7901 Long term (current) use of anticoagulants: Secondary | ICD-10-CM | POA: Diagnosis not present

## 2024-06-01 DIAGNOSIS — Z79899 Other long term (current) drug therapy: Secondary | ICD-10-CM | POA: Diagnosis not present

## 2024-06-01 DIAGNOSIS — Z5111 Encounter for antineoplastic chemotherapy: Secondary | ICD-10-CM | POA: Diagnosis not present

## 2024-06-01 DIAGNOSIS — C3432 Malignant neoplasm of lower lobe, left bronchus or lung: Secondary | ICD-10-CM | POA: Diagnosis not present

## 2024-06-01 DIAGNOSIS — E1140 Type 2 diabetes mellitus with diabetic neuropathy, unspecified: Secondary | ICD-10-CM | POA: Diagnosis not present

## 2024-06-01 LAB — SAMPLE TO BLOOD BANK

## 2024-06-01 LAB — CMP (CANCER CENTER ONLY)
ALT: 34 U/L (ref 0–44)
AST: 27 U/L (ref 15–41)
Albumin: 3.7 g/dL (ref 3.5–5.0)
Alkaline Phosphatase: 491 U/L — ABNORMAL HIGH (ref 38–126)
Anion gap: 16 — ABNORMAL HIGH (ref 5–15)
BUN: 15 mg/dL (ref 6–20)
CO2: 21 mmol/L — ABNORMAL LOW (ref 22–32)
Calcium: 9.3 mg/dL (ref 8.9–10.3)
Chloride: 92 mmol/L — ABNORMAL LOW (ref 98–111)
Creatinine: 0.83 mg/dL (ref 0.44–1.00)
GFR, Estimated: >60 mL/min
Glucose, Bld: 365 mg/dL — ABNORMAL HIGH (ref 70–99)
Potassium: 3.8 mmol/L (ref 3.5–5.1)
Sodium: 129 mmol/L — ABNORMAL LOW (ref 135–145)
Total Bilirubin: 1.1 mg/dL (ref 0.0–1.2)
Total Protein: 7.7 g/dL (ref 6.5–8.1)

## 2024-06-01 LAB — CBC WITH DIFFERENTIAL (CANCER CENTER ONLY)
Abs Immature Granulocytes: 0.03 K/uL (ref 0.00–0.07)
Basophils Absolute: 0 K/uL (ref 0.0–0.1)
Basophils Relative: 1 %
Eosinophils Absolute: 0 K/uL (ref 0.0–0.5)
Eosinophils Relative: 1 %
HCT: 33.5 % — ABNORMAL LOW (ref 36.0–46.0)
Hemoglobin: 11.1 g/dL — ABNORMAL LOW (ref 12.0–15.0)
Immature Granulocytes: 1 %
Lymphocytes Relative: 16 %
Lymphs Abs: 0.9 K/uL (ref 0.7–4.0)
MCH: 31.4 pg (ref 26.0–34.0)
MCHC: 33.1 g/dL (ref 30.0–36.0)
MCV: 94.6 fL (ref 80.0–100.0)
Monocytes Absolute: 0.3 K/uL (ref 0.1–1.0)
Monocytes Relative: 5 %
Neutro Abs: 4.3 K/uL (ref 1.7–7.7)
Neutrophils Relative %: 76 %
Platelet Count: 273 K/uL (ref 150–400)
RBC: 3.54 MIL/uL — ABNORMAL LOW (ref 3.87–5.11)
RDW: 12 % (ref 11.5–15.5)
WBC Count: 5.6 K/uL (ref 4.0–10.5)
nRBC: 0 % (ref 0.0–0.2)

## 2024-06-01 NOTE — Telephone Encounter (Signed)
 Called PT to schedule PF appt for today, day and time confirmed.

## 2024-06-01 NOTE — Telephone Encounter (Signed)
 Ms. Penning called to report over last few days she has felt weak an difficult to carry out her ADLs. Asking for fluids this week thinking she is dehydrated. Has been trying to drink as much as she can tolerate and not eating.  Spoke with her and she agrees to come in for lab today, so we can determine if it is dehydration vs anemia. She agrees and scheduler has been asked to call her with time. Have 2 hours reserved for her tomorrow for blood/IVF depending on her lab results.

## 2024-06-01 NOTE — Patient Instructions (Signed)

## 2024-06-02 ENCOUNTER — Inpatient Hospital Stay

## 2024-06-02 DIAGNOSIS — Z7901 Long term (current) use of anticoagulants: Secondary | ICD-10-CM | POA: Diagnosis not present

## 2024-06-02 DIAGNOSIS — Z86718 Personal history of other venous thrombosis and embolism: Secondary | ICD-10-CM | POA: Diagnosis not present

## 2024-06-02 DIAGNOSIS — C3411 Malignant neoplasm of upper lobe, right bronchus or lung: Secondary | ICD-10-CM | POA: Diagnosis not present

## 2024-06-02 DIAGNOSIS — C250 Malignant neoplasm of head of pancreas: Secondary | ICD-10-CM | POA: Diagnosis not present

## 2024-06-02 DIAGNOSIS — C3432 Malignant neoplasm of lower lobe, left bronchus or lung: Secondary | ICD-10-CM | POA: Diagnosis not present

## 2024-06-02 DIAGNOSIS — Z5111 Encounter for antineoplastic chemotherapy: Secondary | ICD-10-CM | POA: Diagnosis not present

## 2024-06-02 DIAGNOSIS — E1140 Type 2 diabetes mellitus with diabetic neuropathy, unspecified: Secondary | ICD-10-CM | POA: Diagnosis not present

## 2024-06-02 DIAGNOSIS — I10 Essential (primary) hypertension: Secondary | ICD-10-CM | POA: Diagnosis not present

## 2024-06-02 DIAGNOSIS — Z79899 Other long term (current) drug therapy: Secondary | ICD-10-CM | POA: Diagnosis not present

## 2024-06-02 DIAGNOSIS — E860 Dehydration: Secondary | ICD-10-CM

## 2024-06-02 MED ORDER — SODIUM CHLORIDE 0.9 % IV SOLN: INTRAVENOUS | Status: AC

## 2024-06-02 NOTE — Patient Instructions (Signed)

## 2024-06-07 ENCOUNTER — Inpatient Hospital Stay

## 2024-06-07 ENCOUNTER — Encounter (HOSPITAL_BASED_OUTPATIENT_CLINIC_OR_DEPARTMENT_OTHER): Payer: Self-pay

## 2024-06-07 ENCOUNTER — Ambulatory Visit (HOSPITAL_BASED_OUTPATIENT_CLINIC_OR_DEPARTMENT_OTHER)
Admission: RE | Admit: 2024-06-07 | Discharge: 2024-06-07 | Disposition: A | Source: Ambulatory Visit | Attending: Oncology | Admitting: Oncology

## 2024-06-07 DIAGNOSIS — C3432 Malignant neoplasm of lower lobe, left bronchus or lung: Secondary | ICD-10-CM | POA: Diagnosis not present

## 2024-06-07 DIAGNOSIS — K769 Liver disease, unspecified: Secondary | ICD-10-CM | POA: Diagnosis not present

## 2024-06-07 DIAGNOSIS — C250 Malignant neoplasm of head of pancreas: Secondary | ICD-10-CM | POA: Insufficient documentation

## 2024-06-07 DIAGNOSIS — R918 Other nonspecific abnormal finding of lung field: Secondary | ICD-10-CM | POA: Diagnosis not present

## 2024-06-07 DIAGNOSIS — K8689 Other specified diseases of pancreas: Secondary | ICD-10-CM | POA: Diagnosis not present

## 2024-06-07 DIAGNOSIS — I10 Essential (primary) hypertension: Secondary | ICD-10-CM | POA: Diagnosis not present

## 2024-06-07 DIAGNOSIS — Z86718 Personal history of other venous thrombosis and embolism: Secondary | ICD-10-CM | POA: Diagnosis not present

## 2024-06-07 DIAGNOSIS — C3411 Malignant neoplasm of upper lobe, right bronchus or lung: Secondary | ICD-10-CM | POA: Diagnosis not present

## 2024-06-07 DIAGNOSIS — Z79899 Other long term (current) drug therapy: Secondary | ICD-10-CM | POA: Diagnosis not present

## 2024-06-07 DIAGNOSIS — E1140 Type 2 diabetes mellitus with diabetic neuropathy, unspecified: Secondary | ICD-10-CM | POA: Diagnosis not present

## 2024-06-07 DIAGNOSIS — Z7901 Long term (current) use of anticoagulants: Secondary | ICD-10-CM | POA: Diagnosis not present

## 2024-06-07 DIAGNOSIS — Z5111 Encounter for antineoplastic chemotherapy: Secondary | ICD-10-CM | POA: Diagnosis not present

## 2024-06-07 DIAGNOSIS — C259 Malignant neoplasm of pancreas, unspecified: Secondary | ICD-10-CM | POA: Diagnosis not present

## 2024-06-07 MED ORDER — IOHEXOL 300 MG/ML  SOLN
80.0000 mL | Freq: Once | INTRAMUSCULAR | Status: AC | PRN
  Administered 2024-06-07: 85 mL via INTRAVENOUS

## 2024-06-07 MED ORDER — HEPARIN SOD (PORK) LOCK FLUSH 100 UNIT/ML IV SOLN
500.0000 [IU] | Freq: Once | INTRAVENOUS | Status: AC
  Administered 2024-06-07: 500 [IU] via INTRAVENOUS

## 2024-06-07 MED ORDER — IOHEXOL 300 MG/ML  SOLN: 100.0000 mL | Freq: Once | INTRAMUSCULAR | Status: DC | PRN

## 2024-06-07 NOTE — Patient Instructions (Signed)

## 2024-06-10 ENCOUNTER — Other Ambulatory Visit: Payer: Self-pay | Admitting: Oncology

## 2024-06-10 DIAGNOSIS — C250 Malignant neoplasm of head of pancreas: Secondary | ICD-10-CM

## 2024-06-11 ENCOUNTER — Encounter: Payer: Self-pay | Admitting: *Deleted

## 2024-06-11 ENCOUNTER — Inpatient Hospital Stay: Admitting: Nurse Practitioner

## 2024-06-11 ENCOUNTER — Inpatient Hospital Stay: Attending: Genetic Counselor | Admitting: Oncology

## 2024-06-11 DIAGNOSIS — C7802 Secondary malignant neoplasm of left lung: Secondary | ICD-10-CM | POA: Diagnosis not present

## 2024-06-11 DIAGNOSIS — C250 Malignant neoplasm of head of pancreas: Secondary | ICD-10-CM

## 2024-06-11 DIAGNOSIS — C7801 Secondary malignant neoplasm of right lung: Secondary | ICD-10-CM | POA: Insufficient documentation

## 2024-06-11 DIAGNOSIS — C259 Malignant neoplasm of pancreas, unspecified: Secondary | ICD-10-CM | POA: Insufficient documentation

## 2024-06-11 DIAGNOSIS — C3411 Malignant neoplasm of upper lobe, right bronchus or lung: Secondary | ICD-10-CM | POA: Diagnosis present

## 2024-06-11 DIAGNOSIS — Z5111 Encounter for antineoplastic chemotherapy: Secondary | ICD-10-CM | POA: Diagnosis present

## 2024-06-11 DIAGNOSIS — C3432 Malignant neoplasm of lower lobe, left bronchus or lung: Secondary | ICD-10-CM | POA: Diagnosis not present

## 2024-06-11 DIAGNOSIS — E1142 Type 2 diabetes mellitus with diabetic polyneuropathy: Secondary | ICD-10-CM | POA: Insufficient documentation

## 2024-06-11 DIAGNOSIS — I10 Essential (primary) hypertension: Secondary | ICD-10-CM | POA: Insufficient documentation

## 2024-06-11 DIAGNOSIS — Z79899 Other long term (current) drug therapy: Secondary | ICD-10-CM | POA: Diagnosis not present

## 2024-06-11 MED ORDER — LIDOCAINE-PRILOCAINE 2.5-2.5 % EX CREA: 1.0000 | TOPICAL_CREAM | CUTANEOUS | 2 refills | Status: AC

## 2024-06-11 NOTE — Progress Notes (Signed)
 " [facility] OFFICE PROGRESS NOTE   Diagnosis: Pancreas cancer  INTERVAL HISTORY:   Ms. Riga returns as scheduled.  She completed another cycle of 5-FU/liposomal irinotecan  on 05/25/2024.  She reports malaise following chemotherapy.  She has stable peripheral neuropathy symptoms.  No new complaint.  Good appetite.  Objective:  Vital signs in last 24 hours:  Blood pressure 127/84, pulse 70, temperature 97.9 F (36.6 C), temperature source Temporal, resp. rate 18, height 5' 8 (1.727 m), weight 186 lb 4.8 oz (84.5 kg), last menstrual period 09/17/2012, SpO2 100%.    HEENT: No thrush or ulcers Lymphatics: No cervical, supraclavicular, axillary, or inguinal nodes Resp: Lungs clear bilaterally Cardio: Regular rate and rhythm GI: No hepatosplenomegaly, nontender, no mass Vascular: No leg edema    Portacath/PICC-without erythema  Lab Results:  Lab Results  Component Value Date   WBC 5.6 06/01/2024   HGB 11.1 (L) 06/01/2024   HCT 33.5 (L) 06/01/2024   MCV 94.6 06/01/2024   PLT 273 06/01/2024   NEUTROABS 4.3 06/01/2024    CMP  Lab Results  Component Value Date   NA 129 (L) 06/01/2024   K 3.8 06/01/2024   CL 92 (L) 06/01/2024   CO2 21 (L) 06/01/2024   GLUCOSE 365 (H) 06/01/2024   BUN 15 06/01/2024   CREATININE 0.83 06/01/2024   CALCIUM  9.3 06/01/2024   PROT 7.7 06/01/2024   ALBUMIN  3.7 06/01/2024   AST 27 06/01/2024   ALT 34 06/01/2024   ALKPHOS 491 (H) 06/01/2024   BILITOT 1.1 06/01/2024   GFRNONAA >60 06/01/2024   GFRAA >60 02/07/2020    Lab Results  Component Value Date   RJW800 285 (H) 05/04/2024      Imaging:  CT CHEST ABDOMEN PELVIS W CONTRAST Result Date: 06/11/2024 EXAM: CT CHEST, ABDOMEN AND PELVIS WITH CONTRAST 06/07/2024 05:43:51 PM TECHNIQUE: CT of the chest, abdomen and pelvis was performed with the administration of 85 mL of iohexol  (OMNIPAQUE ) 300 MG/ML solution. Multiplanar reformatted images are provided for review. Automated  exposure control, iterative reconstruction, and/or weight based adjustment of the mA/kV was utilized to reduce the radiation dose to as low as reasonably achievable. COMPARISON: 02/04/2024 CLINICAL HISTORY: Restaging metastatic pancreas cancer. FINDINGS: CHEST: MEDIASTINUM AND LYMPH NODES: Heart is unremarkable. No pericardial effusion. The central airways are clear. No mediastinal or hilar lymphadenopathy. No axillary lymphadenopathy. Aortic atherosclerosis. LUNGS AND PLEURA: Bilateral solid and cavitary lung nodules are seen throughout both lungs, compatible with metastatic disease. The cavitary nodules demonstrate progressively increased peripheral soft tissue thickening. The cavitary nodule in the posterior right lower lobe measures 1.8 cm (image 76/3), previously 1.6 cm. Index nodule within the anterior right lung base measures 2.0 x 1.5 cm (image 76/3), previously 1.7 x 0.8 cm. The index nodule in the left upper lobe measures 1.3 cm (image 58/3), previously 1.1 cm. A solid nodule within the posterior medial superior segment of the left lower lobe measures 7 mm (image 57/3), previously 6 mm. An index nodule in the posterior lateral right upper lobe measures 1.4 cm (image 38/3), previously 1 cm. No focal consolidation or pulmonary edema. No pleural effusion. No pneumothorax. ABDOMEN AND PELVIS: LIVER: A faint low-density lesion within the inferior right lobe of the liver measures 8 mm; on the previous exam, this measured the same. Linear hyper- or hypodensity within the periphery of the right lobe of the liver is again noted and appears more conspicuous compared with the previous exam. Likely postoperative indistinctness in the porta hepatis is  similar to the prior study. GALLBLADDER AND BILE DUCTS: Status post cholecystectomy. No biliary ductal dilatation. SPLEEN: The spleen is within normal limits in size and appearance. PANCREAS: Status post Whipple procedure. Main duct stent is again noted within the  pancreas. Calcifications identified within the distal pancreas. ADRENAL GLANDS: Unchanged right adrenal nodule, possibly benign adenoma, measuring 2.4 x 1.2 cm (image 50/2). KIDNEYS, URETERS AND BLADDER: Scarring of the anterior cortex of the upper pole of the right kidney is unchanged. No stones in the kidneys or ureters. No hydronephrosis. No perinephric or periureteral stranding. Urinary bladder is unremarkable. GI AND BOWEL: Status post partial gastrectomy with gastrojejunostomy, compatible with prior Whipple procedure. Stomach demonstrates no acute abnormality. There is no bowel obstruction. Noted within the colon. REPRODUCTIVE ORGANS: Stable appearance of enlarged uterus containing multiple fibroids. The largest fibroid is on the right measuring 6.5 cm. PERITONEUM AND RETROPERITONEUM: No ascites. No free air. VASCULATURE: Aorta is normal in caliber. Aortic atherosclerotic calcification. ABDOMINAL AND PELVIS LYMPH NODES: No abdominal or pelvic lymphadenopathy. BONES AND SOFT TISSUES: No acute osseous abnormality. No focal soft tissue abnormality. IMPRESSION: 1. Progression of bilateral solid and cavitary lung nodules, compatible with metastatic disease. 2. Stable faint low-density lesion within the inferior right hepatic lobe. 3. Status post Whipple procedure with main duct stent and calcifications in the distal pancreas, similar to prior. Electronically signed by: Waddell Calk MD 06/11/2024 08:48 AM EST RP Workstation: [id number]    Medications: I have reviewed the patient's current medications.   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 Tumor invades the duodenal wall and vascular groove, resection margins negative, 4/34 lymph nodes positive MSI-stable, tumor showed instability in 2 loci as did adjacent normal tissue Foundation 1-K-ras G12 V, microsatellite status could not be determined, tumor mutation burden 0 EUS FNA biopsy of  pancreas mass on 07/03/2018-well-differentiated neuroendocrine tumor CTs 01/29/2019-ill-defined pancreas head mass, 5 pulmonary nodules-1 with a small amount of central cavitation, tumor abuts the left margin of the portal vein indistinct density surrounding, hepatic artery, complex cystic lesion of the right kidney, right adrenal mass-characterized as an adenoma on a [facility] MRI 12/21/2018 [facility]  03/03/2019-no focal pancreas activity, no tracer accumulation within the suspicious pulmonary nodules, left uterine mass with tracer accumulation felt to represent a leiomyoma Elevated preoperative CA 19-9--CA 19-9 186 on 01/18/2019 CT chest 04/16/2019-multiple bilateral pulmonary nodules, some with increased cavitation, stable in size Cycle 1 FOLFIRINOX 04/27/2019 Cycle 2 FOLFIRINOX 05/11/2019 Cycle 3 FOLFIRINOX 05/23/2019 Cycle 4 FOLFIRINOX 06/08/2019 Cycle 5 FOLFIRINOX 06/22/2019 CT chest 07/02/2019-stable size of bilateral pulmonary nodules.  Dominant cavitary lesions in both lungs show increased cavitation with thinner walls.  Stable 2.1 cm right adrenal nodule. Cycle 6 FOLFIRINOX 07/06/2019 Cycle 7 FOLFIRINOX 07/21/2019 Cycle 8 FOLFIRINOX 08/03/2019, oxaliplatin  deleted secondary to neuropathy CT chest 08/24/2019-decreased size of several lung nodules with resolution of a left upper lobe nodule, no new nodules Radiation to the pancreas surgical area with concurrent Xeloda  09/13/2019-10/20/2019  CTs 11/29/2019-multiple small pulmonary nodules scattered throughout the lungs bilaterally, appear increased in number and size. No definite evidence of metastatic disease in the abdomen or pelvis. Markedly enlarged and heterogeneous appearing uterus, likely to represent multifocal fibroids. 1 of these lesions appears to be an exophytic subserosal fibroid in the posterior lateral aspect of the uterine body on the left side although this comes in close proximity to the left adnexa such that a primary ovarian lesion is difficult  to completely exclude. CTs 02/07/2020-slight enlargement of bilateral lung nodules, some are  cavitary, no evidence of metastatic disease in the abdomen or pelvis, stable right adrenal nodule, uterine fibroids CTs 04/26/2020-mild enlargement of pulmonary nodules, slight increase in trace pelvic fluid, new soft tissue thickening inferior to the cecal tip suspicious for peritoneal metastasis CT 05/26/2020-improved appearance of soft tissue at the inferior tip of the cecum, mildly thickened short appendix-findings suggestive of resolving appendicitis, stable small bibasilar pulmonary nodules, fibroids Plan biopsy of right cecal tip soft tissue canceled secondary to radiologic improvement CT chest 08/02/2020-enlargement and progressive cavitation of multiple bilateral lung nodules.  Some new nodules are present. CTs 10/24/2020- increase in size of pulmonary nodules, no new nodules, no evidence of metastatic disease in the abdomen, stable right adrenal nodule CT 01/09/2021-slight interval enlargement of pulmonary nodules, stable right adrenal nodule Navigation bronchoscopy 01/30/2021-left lower lobe cavitary nodule FNA-adenocarcinoma, brushing-adenocarcinoma.  Left lower lobe lavage-adenocarcinoma.  Right upper lobe brushing and FNA biopsy of cavitary nodule-adenocarcinoma-immunohistochemical profile consistent with pancreas adenocarcinoma Cycle 1 gemcitabine /Abraxane  03/28/2021 Cycle 2 gemcitabine /Abraxane  04/11/2021 Cycle 3 gemcitabine /Abraxane  04/25/2021 Cycle 4 gemcitabine /Abraxane  05/09/2021 Cycle 5 gemcitabine /Abraxane  05/23/2021 CT chest 06/05/2021-interval cavitation of multiple pulmonary nodules, some nodules have decreased in size, no new or enlarging nodules Cycle 6 gemcitabine /Abraxane  06/06/2021 Cycle 7 gemcitabine /Abraxane  06/21/2021 Cycle 8 gemcitabine /Abraxane  07/05/2021 Cycle 9 Gemcitabine /Abraxane  07/19/2021 Cycle 10 gemcitabine  08/01/2021-Abraxane  held secondary to neuropathy CT chest  08/13/2021-mild decrease in size and wall thickness of multiple cavitary nodules, no new or progressive disease in the chest, indeterminate low-attenuation right liver lesions Cycle 11 gemcitabine  08/16/2021-Abraxane  held secondary to neuropathy Cycle 12 gemcitabine  08/30/2021-Abraxane  held secondary to neuropathy Cycle 13 gemcitabine  09/13/2021-Abraxane  held secondary to neuropathy Cycle 14 gemcitabine  09/27/2021-Abraxane  held secondary to neuropathy Cycle 15 gemcitabine  10/11/2021-Abraxane  held secondary to neuropathy CTs 10/22/2021-no change in multiple cavitary lung nodules, no evidence of disease progression, ill-defined hypodense lesion in the posterior right liver suspicious for metastatic disease Cycle 16 gemcitabine  10/25/2021 Cycle 17 gemcitabine  11/08/2021 Cycle 18 Gemcitabine  11/22/2021 Cycle 19 gemcitabine  12/06/2021 Cycle 20 Gemcitabine  12/20/2021 CT 12/31/2021-mild increase in size of bilateral pulmonary metastases, stable subtle continuation right liver lesions Cycle 20 gemcitabine /Abraxane  01/03/2022 Cycle 21 gemcitabine /Abraxane  01/17/2022 Cycle 22 gemcitabine /Abraxane  01/31/2022 Cycle 23 gemcitabine /Abraxane  02/14/2022 Cycle 24 gemcitabine /Abraxane  02/28/2022 CTs 03/11/2022-widespread metastatic disease to the lungs again noted with slight involution of several of the pulmonary nodules, no definite new nodules noted; interval cavitation of solid lesion in the posterior aspect right lobe of the liver, no new liver lesions noted. Cycle 25 Gemcitabine /Abraxane  03/14/2022 Cycle 26 gemcitabine /Abraxane  03/28/2022 Cycle 27 gemcitabine /Abraxane  04/25/2022 Cycle 28 gemcitabine /Abraxane  05/09/2022 Cycle 29 Gemcitabine  05/23/2022, Abraxane  held due to neuropathy CTs 06/04/2022-stable multifocal cavitary pulmonary nodules.  No definite new nodules.  No new nodules greater than a centimeter.  Decreased size of the right posterior hepatic lobe lesion.  Generalized heterogeneity and new focal areas of hypodensity  in the liver. Cycle 30 Gemcitabine  06/06/2022, Abraxane  held due to neuropathy Cycle 31 gemcitabine  06/20/2022, Abraxane  held due to neuropathy Cycle 32 gemcitabine  07/04/2022, Abraxane  held due to neuropathy Cycle 33 gemcitabine   07/18/2022 ,Abraxane  held due to neuropathy Cycle 34 gemcitabine  08/15/2022, Abraxane  held due to neuropathy Cycle 35 gemcitabine  08/29/2022, Abraxane  held due to neuropathy CTs 09/10/2022-enlarging pulmonary metastases.  Subtle poorly defined hypoattenuating lesions in the liver, less well-seen than on 06/04/2022.  Suspected new lesion in the dome of the right hepatic lobe Cycle 36 gemcitabine /Abraxane  09/12/2022 Cycle 37 gemcitabine /Abraxane  09/26/2022 Cycle 38 gemcitabine /Abraxane  10/10/2022 Cycle 39 gemcitabine /Abraxane  11/07/2022 Cycle 40 gemcitabine /Abraxane  11/21/2022 Cycle 41 gemcitabine /Abraxane  12/05/2022 Cycle 42 gemcitabine /Abraxane  12/19/2022 CT is 12/30/2022-new left hepatic  lesion, stable bilateral pulmonary metastases Cycle 43 gemcitabine /Abraxane  01/02/2023 CT-guided biopsy of liver lesion 01/27/2023-acute/chronic inflammation, negative for malignancy, culture negative Cycle 44 gemcitabine /Abraxane  01/30/2023 Cycle 45 gemcitabine /Abraxane  02/13/2023 Cycle 46 gemcitabine /Abraxane  02/27/2023 Cycle 47 gemcitabine /Abraxane  03/13/2023 Cycle 48 gemcitabine /Abraxane  03/27/2023 CTs 04/21/2023-numerous solid and cavitary lung nodules overall similar, some slightly enlarged; multiple new and enlarging rim-enhancing liver lesions. Cycle 1 FOLFIRI with liposomal irinotecan  05/21/2023 Cycle 2 held 06/05/2023 due to neutropenia Cycle 2 FOLFIRI with liposomal irinotecan  06/18/2023, irinotecan  dose reduced (white cell growth factor declined per patient) Cycle 3 FOLFIRI with liposomal irinotecan  07/02/2023 Cycle 4 FOLFIRI with liposomal irinotecan  07/16/2023 Cycle 5 FOLFIRI with liposomal irinotecan  07/30/2023 Cycle 6 FOLFIRI with liposomal irinotecan  08/13/2023 CTs 08/20/2023-numerous  cavitary lesions seen in the lungs, extent and distribution appear similar.  A few lesions are slightly smaller.  Numerous liver lesions are now smaller.  Stable right adrenal nodule, adenoma favored. Cycle 7 FOLFIRI with liposomal irinotecan  08/27/2023 Cycle 8 FOLFIRI with liposomal irinotecan  09/11/2023 Cycle 9 FOLFIRI with liposomal irinotecan  09/24/2023 Cycle 10 FOLFIRI with liposomal irinotecan  10/08/2023 Cycle 11 FOLFIRI with liposomal irinotecan  10/22/2023 Cycle 12 FOLFIRI with liposomal irinotecan  11/05/2023 CT 11/17/2023-majority of multiple irregular thick-walled cavitary lesions throughout bilateral lungs grossly unchanged or slightly decreased in size.  Redemonstration of peripheral wedge-shaped ill-defined hypoattenuating areas in the liver suggesting treatment response.  No discrete new suspicious liver lesions seen.  No new metastatic disease. Cycle 13 FOLFIRI with liposomal irinotecan  11/19/2023 Cycle 14 FOLFIRI with liposomal irinotecan  12/03/2023 Cycle 15 FOLFIRI with liposomal irinotecan  12/24/2023 Cycle 16 FOLFIRI with liposomal irinotecan  01/14/2024 02/04/2024 treatment held due to elevated LFTs, referred for CTs CTs 02/04/2024-liver lesions less conspicuous.  No significant new lesions identified.  Innumerable scattered cavitary lung nodules bilaterally.  Most are similar but a few demonstrate reduced thickening of the wall or reduced surrounding inflammatory stranding along the margins.  No substantial new nodularity observed. 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  Apixaban  10/24/2022 Port-A-Cath malfunction 08/01/2023.  Port-A-Cath removed and replaced 08/08/2023.       Disposition: Ms. Delcid has metastatic pancreas cancer.  She has been maintained on 5-FU/liposomal irinotecan  since December 2024.  The CA 19-9 has increased over the past few months.  The restaging CTs on 06/07/2024 revealed a slight increase in the size of multiple lung nodules.  I reviewed the CT findings and images with Ms. Macquarrie.  I recommend discontinuing the 5-FU/liposomal irinotecan .  We discussed treatment options.  I recommend recycled FOLFOX as a standard option.  She is concerned about development of progressive neuropathy symptoms.  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  She saw Dr. Donato earlier this year.  We will refer her back to Dr. Donato to consider clinical trial options.  We will check the tumor for and NRG 1 alteration.  [facility] to [facility] an immunotherapy trial.  We will contact the [facility] to check on current clinical trial availability.  We can also consider repeat NGS testing.  She will return for an office visit in approximately 3 weeks.  Arley Hof, MD  06/11/2024  8:56 AM   "

## 2024-06-11 NOTE — Progress Notes (Signed)
 PATIENT NAVIGATOR PROGRESS NOTE  Name: Alyssa Ross Date: 06/11/2024 MRN: [id number]  DOB: [birth year]   Reason for visit:  Coordination of care  Comments:   Scheduled pt with Dr Donato at [facility] on 06/16/24 at 2:15, pt made aware of appt Ordered Foundation one testing for NRG mutation testing Requested records from [facility] on 2023 consult by faxing request to [phone]    Time spent counseling/coordinating care: 45-60 minutes

## 2024-06-14 ENCOUNTER — Encounter: Payer: Self-pay | Admitting: Oncology

## 2024-06-14 NOTE — Progress Notes (Signed)
 "  PATIENT IDENTIFICATION  Alyssa Ross is a 59 y.o.  female from [place], [place] with metastatic pancreatic cancer seen in consultation today at the request of Dr. Arvella Hof.   ASSESSMENT & PLAN  Metastatic Pancreatic Cancer: Patient is seen today for a new diagnosis of metastatic pancreatic cancer. Her previous chemotherapy regimens include FOLFIRINOX and gemcitabine /nab-paclitaxel . The recent scan on 04/21/2023 indicated some progression in the liver, although the lungs appeared stable. Prior liver biopsy revealed acute and chronic inflammation, but no cancer. She has a KRAS G12V mutation. She began FOLFIRI or 5-FU/nal-irinotecan  (no oxaliplatin  to spare her neuropathy worsening). CT 05/2024 with some progression in setting of treatments being stretched out to every 21 and sometimes 28 dd. She would like to try to get back to q14dd treatments to see if this would help. This is reasonable with close attention paid to her CA 19-9. We discussed clinical trials and she is on waiting list for KRAS G12V studies. However, she expressed concern about side effects in phase I studies and may not be interested in trials that cause her unknown side effects. Encouraged her to reach out if she has progression when back on q14dd schedule of FOLFIRI or 5-FU/nal-irinotecan .  Diabetes Mellitus: Her diabetes has worsened over the past 4 years. She is currently on Tresiba  15-17 units at night and Humalog , which she adjusts based on her needs.  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 Could consider duloxetine.  Hypertension: She is currently taking valsartan  and spironolactone  for blood pressure management. Hyperlipidemia: She is taking rosuvastatin  three times a week for cholesterol management. 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  This note has been created using automated tools and reviewed for accuracy by Buzzy Pitter  Mettu. PHYSICIANS  PCP: Jarold Catheryn Roup, MD Medical oncologist: Dr. Arvella Hof  ONCOLOGY HISTORY   07/03/18 EUS FNA biopsy of pancreas mass on well-differentiated neuroendocrine tumor 01/29/19 CT ill-defined pancreas head mass, 5 pulmonary nodules-1 with a small amount of central cavitation, tumor abuts the left margin of the portal vein indistinct density surrounding, hepatic artery, complex cystic lesion of the right kidney, right adrenal mass-characterized as an adenoma on a [facility] MRI 12/21/2018 [facility]  03/03/2019-no focal pancreas activity, no tracer accumulation within the suspicious pulmonary nodules, left uterine mass with tracer accumulation felt to represent a leiomyoma Elevated preoperative CA 19-9--CA 19-9 186 on 01/18/2019 03/04/19 S/p pancreaticoduodenectomy pT3pN2. Tumor invades the duodenal wall and vascular groove, resection margins negative, 4/34 lymph nodes positive. MSI-stable, tumor showed instability in 2 loci as did adjacent normal tissue 04/16/19 CT chest multiple bilateral pulmonary nodules, some with increased cavitation, stable in size Cycle 1 FOLFIRINOX 04/27/2019 Cycle 2 FOLFIRINOX 05/11/2019 Cycle 3 FOLFIRINOX 05/23/2019 Cycle 4 FOLFIRINOX 06/08/2019 Cycle 5 FOLFIRINOX 06/22/2019 CT chest 07/02/2019-stable size of bilateral pulmonary nodules. Dominant cavitary lesions in both lungs show increased cavitation with thinner walls. Stable 2.1 cm right adrenal nodule. Cycle 6 FOLFIRINOX 07/06/2019 Cycle 7 FOLFIRINOX 07/21/2019 Cycle 8 FOLFIRINOX 08/03/2019, oxaliplatin  deleted secondary to neuropathy CT chest 08/24/2019-decreased size of several lung nodules with resolution of a left upper lobe nodule, no new nodules Radiation to the pancreas surgical area with concurrent Xeloda  09/13/2019-10/20/2019  CTs 11/29/2019-multiple small pulmonary nodules scattered throughout the lungs bilaterally, appear increased in number and size. No definite evidence of metastatic disease in  the abdomen or pelvis. Markedly enlarged and heterogeneous appearing uterus, likely to represent multifocal fibroids. 1 of these lesions appears to be  an exophytic subserosal fibroid in the posterior lateral aspect of the uterine body on the left side although this comes in close proximity to the left adnexa such that a primary ovarian lesion is difficult to completely exclude. CTs 02/07/2020-slight enlargement of bilateral lung nodules, some are cavitary, no evidence of metastatic disease in the abdomen or pelvis, stable right adrenal nodule, uterine fibroids CTs 04/26/2020-mild enlargement of pulmonary nodules, slight increase in trace pelvic fluid, new soft tissue thickening inferior to the cecal tip suspicious for peritoneal metastasis CT 05/26/2020-improved appearance of soft tissue at the inferior tip of the cecum, mildly thickened short appendix-findings suggestive of resolving appendicitis, stable small bibasilar pulmonary nodules, fibroids Plan biopsy of right cecal tip soft tissue canceled secondary to radiologic improvement CT chest 08/02/2020-enlargement and progressive cavitation of multiple bilateral lung nodules. Some new nodules are present. CTs 10/24/2020- increase in size of pulmonary nodules, no new nodules, no evidence of metastatic disease in the abdomen, stable right adrenal nodule CT 01/09/2021-slight interval enlargement of pulmonary nodules, stable right adrenal nodule Navigation bronchoscopy 01/30/2021-left lower lobe cavitary nodule FNA-adenocarcinoma, brushing-adenocarcinoma. Left lower lobe lavage-adenocarcinoma. Right upper lobe brushing and FNA biopsy of cavitary nodule-adenocarcinoma-immunohistochemical profile consistent with pancreas adenocarcinoma Cycle 1 gemcitabine /Abraxane  03/28/2021 Cycle 2 gemcitabine /Abraxane  04/11/2021 Cycle 3 gemcitabine /Abraxane  04/25/2021 Cycle 4 gemcitabine /Abraxane  11/30/2022Cycle 5 gemcitabine /Abraxane  05/23/2021 CT chest 06/05/2021-interval  cavitation of multiple pulmonary nodules, some nodules have decreased in size, no new or enlarging nodules Cycle 6 gemcitabine /Abraxane  06/06/2021 Cycle 7 gemcitabine /Abraxane  06/21/2021 Cycle 8 gemcitabine /Abraxane  07/05/2021 Cycle 9 gemcitabine /Abraxane  07/19/2021 Cycle 10 gemcitabine  08/01/2021-Abraxane  held secondary to neuropathy CT chest 08/13/2021-mild decrease in size and wall thickness of multiple cavitary nodules, no new or progressive disease in the chest, indeterminate low-attenuation right liver lesions Cycle 11 gemcitabine  08/16/2021-Abraxane  held secondary to neuropathy Cycle 12 gemcitabine  08/30/2021-Abraxane  held secondary to neuropathy Cycle 13 gemcitabine  09/13/2021-Abraxane  held secondary to neuropathy Cycle 14 gemcitabine  09/27/2021-Abraxane  held secondary to neuropathy Cycle 15 gemcitabine  10/11/2021-Abraxane  held secondary to neuropathy CTs 10/22/2021-no change in multiple cavitary lung nodules, no evidence of disease progression, ill-defined hypodense lesion in the posterior right liver suspicious for metastatic disease Cycle 16 gemcitabine  10/25/2021 Cycle 17 gemcitabine  11/08/2021 Cycle 18 gemcitabine  11/22/2021 Cycle 19 gemcitabine  12/06/2021 Cycle 20 Gemcitabine  12/20/2021 CT 12/31/2021-mild increase in size of bilateral pulmonary metastases, stable subtle continuation right liver lesions Cycle 20 gemcitabine /Abraxane  01/03/2022 Cycle 21 gemcitabine /Abraxane  01/17/2022 Cycle 22 gemcitabine /Abraxane  01/31/2022 Cycle 23 gemcitabine /Abraxane  02/14/2022 Cycle 24 gemcitabine /Abraxane  02/28/2022 CTs 03/11/2022-widespread metastatic disease to the lungs again noted with slight involution of several of the pulmonary nodules, no definite new nodules noted; interval cavitation of solid lesion in the posterior aspect right lobe of the liver, no new liver lesions noted. Cycle 25 Gemcitabine /Abraxane  03/14/2022 Cycle 26 gemcitabine /Abraxane  03/28/2022 Cycle 27 gemcitabine /Abraxane  04/25/2022 Cycle 28  gemcitabine /Abraxane  05/09/2022 Cycle 29 Gemcitabine  05/23/2022, Abraxane  held due to neuropathy CTs 06/04/2022-stable multifocal cavitary pulmonary nodules. No definite new nodules. No new nodules greater than a centimeter. Decreased size of the right posterior hepatic lobe lesion. Generalized heterogeneity and new focal areas of hypodensity in the liver. Cycle 30 Gemcitabine  06/06/2022, Abraxane  held due to neuropathy Cycle 31 gemcitabine  06/20/2022, Abraxane  held due to neuropathy Cycle 32 gemcitabine  07/04/2022, Abraxane  held due to neuropathy Cycle 33 gemcitabine  07/18/2022 ,Abraxane  held due to neuropathy Cycle 34 gemcitabine  08/15/2022, Abraxane  held due to neuropathy Cycle 35 gemcitabine  08/29/2022, Abraxane  held due to neuropathy CTs 09/10/2022-enlarging pulmonary metastases. Subtle poorly defined hypoattenuating lesions in the liver, less well-seen than on 06/04/2022. Suspected new lesion in the dome of the  right hepatic lobe Cycle 36 gemcitabine /Abraxane  09/12/2022 Cycle 37 gemcitabine /Abraxane  09/26/2022 Cycle 38 gemcitabine /Abraxane  10/10/2022 Cycle 39 gemcitabine /Abraxane  11/07/2022 Cycle 40 gemcitabine /Abraxane  11/21/2022 Cycle 41 gemcitabine /Abraxane  12/05/2022 Cycle 42 gemcitabine /Abraxane  12/19/2022 CT is 12/30/2022-new left hepatic lesion, stable bilateral pulmonary metastases Cycle 43 gemcitabine /Abraxane  01/02/2023 CT-guided biopsy of liver lesion 01/27/2023-acute/chronic inflammation, negative for malignancy, culture negative Cycle 44 gemcitabine /Abraxane  01/30/2023 Cycle 45 gemcitabine /Abraxane  02/13/2023 Cycle 46 gemcitabine /Abraxane  02/27/2023 Cycle 47 gemcitabine /Abraxane  03/13/2023 Cycle 48 gemcitabine /Abraxane  03/27/2023 04/21/2023 CT numerous solid and cavitary lung nodules overall similar, some slightly enlarged; multiple new and enlarging rim-enhancing liver lesions. FOLFIRI or 5-FU/nal-irinotecan  02/04/24 CT C/A/P stable 03/2024 Various treatment delays and holds and stretching  cycles out to every 21 or 28 dd 06/07/24 CT C/A/P slight progression in lungs  The patient's disease status is up-to-date as described in the oncology history above.  MOLECULAR DIAGNOSTICS AND MARKERS  Microsatelite unknown [facility]: KRAS G12V, microsatellite and TMB cannot be determined Guard[employer] 360:   INTERVAL HISTORY   History of Present Illness Alyssa Ross is a 59 year old female with metastatic pancreatic adenocarcinoma who presents for oncology follow-up regarding mild disease progression and chemotherapy management.  She has been receiving FOLFIRI (or 5-FU/nal-irinotecan ). Initially, chemotherapy was administered every two weeks, and the patient noted her CA 19-9 decreased during this period. Several months ago, her chemotherapy schedule was extended to every three weeks due to scheduling issues and episodes of neutropenia, occasionally resulting in longer intervals. During these extended intervals, her CA 19-9 began to rise and her most recent CT scan (June 07, 2024) showed increases in the size of several lung nodules.  She experiences persistent fatigue but otherwise feels well. She has developed chemotherapy-induced neuropathy and cold sensitivity, which she attributes to prior oxaliplatin  exposure. She expresses concern about worsening neuropathy if oxaliplatin  is reintroduced. She notes that her highest CA 19-9 value (285) may have been influenced by a concurrent elevation in bilirubin.  She is aware of her KRAS G12V mutation and has discussed clinical trials targeting KRAS, including pan-KRAS and G12V-specific inhibitors, though she understands these are not curative and may have significant side effects. She has previously explored cell-based therapies at [facility] without significant results in pancreatic cancer. She is considering integrative medicine approaches for supportive care and symptom relief.  REVIEW OF SYSTEMS  12 point review of systems reviewed and  pertinent positives and negatives are as noted in the Interval History.  HISTORY AND MEDICATIONS   Past Medical History:  Diagnosis Date   Diabetes mellitus without complication (CMS/HHS-HCC)    Hypertension    Pancreatic cancer (CMS/HHS-HCC) 12/28/2018    Past Surgical History:  Procedure Laterality Date   Whipple Procedure, Diagnostic Laparoscopy  03/04/2019   Dr. Aron    Family History  Problem Relation Age of Onset   High blood pressure (Hypertension) Mother    Hyperlipidemia (Elevated cholesterol) Mother    Diabetes Mother    Diabetes Father    Hyperlipidemia (Elevated cholesterol) Father    High blood pressure (Hypertension) Father    Thyroid  disease Sister    High blood pressure (Hypertension) Brother    Coronary Artery Disease (Blocked arteries around heart) Brother    Deep vein thrombosis (DVT or abnormal blood clot formation) Brother    Diabetes Brother    Pancreatic cancer Cousin    Pancreatic cancer Cousin    Breast cancer Cousin     Social History   Socioeconomic History   Marital status: Single  Tobacco Use   Smoking status: Never   Smokeless tobacco:  Never  Vaping Use   Vaping status: Never Used  Substance and Sexual Activity   Drug use: Never   Sexual activity: Defer   Social Drivers of Health   Food Insecurity: No Food Insecurity (04/05/2022)   Received from [facility]   Hunger Vital Sign    Within the past 12 months, you worried that your food would run out before you got the money to buy more.: Never true    Within the past 12 months, the food you bought just didn't last and you didn't have money to get more.: Never true  Transportation Needs: No Transportation Needs (04/05/2022)   Received from [facility] - [facility]    Lack of Transportation (Medical): No    Lack of Transportation (Non-Medical): No   Received from [facility]   Social Network    Allergies  Allergen Reactions    Cherry Itching, Rash and Unknown    Other reaction(s): Unknown Other reaction(s): Unknown    Lemon Oil Itching and Rash   Current Outpatient Medications  Medication Sig Dispense Refill   ELIQUIS  5 mg tablet Take 5 mg by mouth 2 (two) times daily     insulin  DEGLUDEC (TRESIBA  FLEXTOUCH U-100) pen injector (concentration 100 units/mL) Tresiba  FlexTouch U-100 insulin  100 unit/mL (3 mL) subcutaneous pen  INJECT 60 UNITS INTO THE SKIN ONCE A DAY IN THE MORNING UNTIL BLOOD SUGARS ARE BELOW 150     insulin  lispro (HUMALOG  KWIKPEN INSULIN ) 200 unit/mL (3 mL) InPn 4 units with breakfast, 7 units with lunch and dinner (TDD up to 30 units)     magnesium  oxide (MAG-OX) 400 mg (241.3 mg magnesium ) tablet magnesium  oxide 400 mg (241.3 mg magnesium ) tablet     potassium chloride  (KLOR-CON  M20) 20 MEQ ER tablet Klor-Con  M20 mEq tablet,extended release     rosuvastatin  (CRESTOR ) 10 MG tablet rosuvastatin  10 mg tablet     spironolactone  (ALDACTONE ) 25 MG tablet Take 25 mg by mouth once daily     valsartan  (DIOVAN ) 160 MG tablet Take 160 mg by mouth once daily     No current facility-administered medications for this visit.   PHYSICAL EXAMINATION   BP 132/83 (BP Location: Right upper arm, Patient Position: Sitting)   Pulse 76   Temp 37 C (98.6 F) (Oral)   Resp 18   Wt 88.1 kg (194 lb 3.6 oz)   SpO2 100%   BMI 29.10 kg/m  Wt Readings from Last 3 Encounters:  06/16/24 88.1 kg (194 lb 3.6 oz)  04/30/23 83.8 kg (184 lb 11.9 oz)  06/29/21 83.8 kg (184 lb 12.8 oz)    ECOG: 0 Fully active; no performance restrictions  Constitutional: NAD, robust HEENT: pupils round, EOMI, sclera clear, external ear inspection normal, wearing a mask Oropharynx: deferred, wearing a mask Neck: supple without visible cervical adenopathy Cardiovascular: regular rate and rhythm and without murmurs, rubs or gallops Respiratory: clear to auscultation bilaterally Abdomen: soft, nontender, nondistended, bowel  sounds present, no hepatosplenomegaly Rectal: deferred Extremities: no lower extremity edema Skin: normal coloration and turgor, no rashes or suspicious skin lesions  Neurological: AAO x 4, normal speech, grossly normal motor and sensory exam, normal muscle tone, no tremors  LABS AND STUDIES   Results for orders placed or performed during the hospital encounter of 05/02/23  Pathology - Slide Consult  Result Value Ref Range   Case Report      Surgical Pathology Report  Case: [id number]                                 Authorizing Provider:  Donato Buzzy Pitter, MD Collected:           05/02/2023                  Ordering Location:     [facility]                    Received:            05/06/2023 1258             Pathologist:           Laurence Chasten, MD                                                          Specimen:    Liver, [id number]                                                                     DIAGNOSIS      A. Outside case, [phone], [facility] [place], [place].  Date of procedure 01/27/23:   Liver, right and left lobe, needle core biopsy: - Predominant fibrous tissue with acute and chronic inflammation, hemosiderin deposition, and foamy histiocytes. - A small portion of benign hepatic parenchyma. - Negative for malignancy.     Clinical Information      [facility] review of outside slides is requested.  - 07/03/18 EUS FNA biopsy of pancreas mass on well-differentiated neuroendocrine tumor - 03/04/19 S/p pancreaticoduodenectomy pT3pN2. Tumor invades the duodenal wall and vascular groove, resection margins negative, 4/34 lymph nodes positive. MSI-stable. - 04/16/19 CT chest multiple bilateral pulmonary nodules, some with increased cavitation, stable in size - FOLFIRINOX 04/2019 - 07/2019 - Radiation to the pancreas surgical area with concurrent Xeloda  09/13/2019-10/20/2019 - Navigation bronchoscopy 01/30/2021-left lower lobe cavitary  nodule FNA-adenocarcinoma, brushing-adenocarcinoma. Left lower lobe lavage-adenocarcinoma. Right upper lobe brushing and FNA biopsy of cavitary nodule-adenocarcinoma-immunohistochemical profile consistent with pancreas adenocarcinoma. - gemcitabine /Abraxane  03/2021 - 03/2023 - CTs 10/22/2021-ill-defined hypodense lesion in the posterior right liver suspicious for metastatic disease  - CTs 03/11/2022-interval cavitation of solid lesion in the posterior aspect right lobe of the liver, no new liver lesions noted.  - CTs 06/04/2022-decreased size of the right posterior hepatic lobe lesion. Generalized heterogeneity and new focal areas of hypodensity in the liver.  - CTs 09/10/2022-subtle poorly defined hypoattenuating lesions in the liver, less well-seen than on 06/04/2022. Suspected new lesion in the dome of the right hepatic lobe  - CT is 12/30/2022-new left hepatic lesion, stable bilateral pulmonary metastases     Gross Examination      Outside case A:              [id number] Date of surgery:              01/27/2023 Number of stained slides:  4 Number of blocks:                        0 Number of unstained slides:         0  Outside path report received?    Yes Material to be returned?             Yes  Received from:   [facility] [address], [place] [address] P:  [phone] F:  [phone]     Microscopic Examination      Microscopic examination is performed.    Additional Documentation      All immunohistochemistry, in situ hybridization tests and special stains performed at [facility] and reported herein were developed, validated and their performance characteristics determined by the [facility]. During the performance of these tests, appropriate positive and negative control slides are also performed and reviewed. All control slides and internal controls (when applicable) demonstrate the expected  immunoreactive patterns and/or nucleic acid hybridization. These ancillary studies were deemed medically necessary by the requesting pathologist. They were ordered following review of the H&E and clinical history except when part of a liver/kidney protocol or where clinical history (e.g. immunocompromised, critically ill, history of malignancy) clearly indicates. Some of the tests may not be cleared or approved by the U.S. Food and Drug Administration (FDA).  The FDA has determined that such clearance or approval is not necessary.  These tests are used for clinical purposes and should not be regarded as investigational or as research.  This laboratory is certified under the Clinical Laboratory Improvement Amendments of 1988 (CLIA) as qualified to perform high complexity clinical testing.    Attestation      All of the diagnostic evaluations on the enumerated specimens have been personally conducted by the pathologists involved in the care of this patient as indicated by the electronic signatures above.     No results found for this or any previous visit.    Outside studies were reviewed personally in the clinic.  CARE TEAM  Patient Care Team: Jarold Catheryn Roup, MD as PCP - General (Internal Medicine) Lenon Channing Campi, PA as Physician Assistant (Hematology and Oncology) Mettu, Buzzy Pitter, MD as Consulting Provider (Oncology)  FUTURE APPOINTMENTS   Future Appointments   This patient does not currently have any appointments scheduled.     I spent a total of 40 minutes in both face-to-face and non-face-to-face activities for this visit on the date of this encounter. The patient understood the treatment plan and there were no barriers to understanding. All the patient's questions were answered to his or her satisfaction.  The patient knows to call with any questions or concerns in the meantime.  "

## 2024-06-15 ENCOUNTER — Telehealth: Payer: Self-pay | Admitting: Oncology

## 2024-06-15 NOTE — Telephone Encounter (Signed)
 PT only wants to see GBS; appts updated.

## 2024-06-16 ENCOUNTER — Inpatient Hospital Stay: Admitting: Nurse Practitioner

## 2024-06-16 ENCOUNTER — Inpatient Hospital Stay

## 2024-06-16 ENCOUNTER — Encounter: Payer: Self-pay | Admitting: *Deleted

## 2024-06-16 NOTE — Progress Notes (Signed)
 Faxed notification from [facility] that specimen has been received for testing.

## 2024-06-18 ENCOUNTER — Inpatient Hospital Stay

## 2024-06-18 ENCOUNTER — Encounter: Payer: Self-pay | Admitting: *Deleted

## 2024-06-21 ENCOUNTER — Telehealth: Payer: Self-pay | Admitting: Oncology

## 2024-06-21 ENCOUNTER — Ambulatory Visit: Payer: Self-pay | Admitting: Internal Medicine

## 2024-06-21 ENCOUNTER — Encounter: Payer: Self-pay | Admitting: *Deleted

## 2024-06-21 ENCOUNTER — Encounter: Payer: Self-pay | Admitting: Internal Medicine

## 2024-06-21 ENCOUNTER — Telehealth: Payer: Self-pay

## 2024-06-21 VITALS — BP 124/80 | HR 66 | Temp 98.8°F | Ht 68.0 in | Wt 191.4 lb

## 2024-06-21 DIAGNOSIS — E785 Hyperlipidemia, unspecified: Secondary | ICD-10-CM

## 2024-06-21 DIAGNOSIS — Z794 Long term (current) use of insulin: Secondary | ICD-10-CM

## 2024-06-21 DIAGNOSIS — E1169 Type 2 diabetes mellitus with other specified complication: Secondary | ICD-10-CM | POA: Diagnosis not present

## 2024-06-21 DIAGNOSIS — I10 Essential (primary) hypertension: Secondary | ICD-10-CM | POA: Diagnosis not present

## 2024-06-21 DIAGNOSIS — E1165 Type 2 diabetes mellitus with hyperglycemia: Secondary | ICD-10-CM | POA: Diagnosis not present

## 2024-06-21 MED ORDER — VALSARTAN 160 MG PO TABS: 160.0000 mg | ORAL_TABLET | Freq: Every day | ORAL | 3 refills | Status: AC

## 2024-06-21 NOTE — Telephone Encounter (Signed)
 Patient was contacted regarding her scheduled appointments for Wednesday, 06/30/2024. Patient stated she only anticipated an office visit with Dr. Cloretta to discuss the next treatment plan. This clinical research associate informed the patient that the providers nurse requested a flush and treatment appointment be scheduled in addition to the office visit. Patient was encouraged to keep all scheduled appointments as requested by the provider. If any are deemed unnecessary, the provider will advise on the day of the visit for cancellation. Patient confirmed availability and agreed to the plan. She is aware of the following appointments scheduled for 06/30/2024: Flush, Office visit with Dr. Cloretta, Treatment.

## 2024-06-21 NOTE — Progress Notes (Signed)
 I,Alyssa Ross, CMA,acting as a neurosurgeon for Alyssa LOISE Slocumb, MD.,have documented all relevant documentation on the behalf of Alyssa LOISE Slocumb, MD,as directed by  Alyssa LOISE Slocumb, MD while in the presence of Alyssa LOISE Slocumb, MD.  Subjective:  Patient ID: Alyssa Ross , female    DOB: [birth year] , 59 y.o.   MRN: [id number]  Chief Complaint  Patient presents with   Diabetes    She is here today for BP/DM check. She is followed by [facility] for diabetes med management. She reports compliance with meds. She reports she will not complete cologuard that is due. She also has visit with endocrinologist tomorrow.  Denies completing pap/ mammogram.    Hypertension   Hyperlipidemia   Discussed the use of AI scribe software for clinical note transcription with the patient, who gave verbal consent to proceed.  History of Present Illness     She presents today for DM/HTN check. Reports compliance with meds. She is followed by [facility] for her diabetes. She has upcoming appt tomorrow.   No new concerns.   Hypertension This is a chronic problem. The current episode started more than 1 year ago. The problem has been gradually improving since onset. The problem is uncontrolled. Pertinent negatives include no blurred vision. Past treatments include ACE inhibitors and diuretics. The current treatment provides moderate improvement.  Diabetes She presents for her follow-up diabetic visit. She has type 2 diabetes mellitus. There are no hypoglycemic associated symptoms. Pertinent negatives for diabetes include no blurred vision, no polydipsia, no polyphagia and no polyuria. There are no hypoglycemic complications. Risk factors for coronary artery disease include diabetes mellitus, hypertension and dyslipidemia. Current diabetic treatment includes insulin  injections. She is compliant with treatment most of the time. She is following a diabetic diet. She participates in exercise  intermittently. Eye exam is current.     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   Yeast vaginitis 2006     Family History  Problem Relation Age of Onset   Diabetes Mother    Dementia Mother    Hyperlipidemia Mother    Hypertension Mother    Irregular heart beat Mother    Diabetes Father    Hypertension Father    Hyperlipidemia Father    Down syndrome Sister    Diabetes Sister    Hyperlipidemia Brother    Heart Problems Brother    Goiter Maternal Aunt    Thyroid  nodules Sister    Cancer Cousin 60       eye; maternal first cousin   Goiter Cousin    Cancer Paternal Aunt        unknown form of cancer   Cancer Cousin        unknown form of cancer; paternal first cousin   Pancreatic cancer Cousin 17  paternal first cousin   Cancer Cousin 68       unknown cancer; paternal first cousin   Cancer Cousin 8       unknown cancer; paternal first cousin     Current Outpatient Medications:    apixaban  (ELIQUIS ) 5 MG TABS tablet, Take 1 tablet (5 mg total) by mouth 2 (two) times daily., Disp: 180 tablet, Rfl: 2   cetirizine (ZYRTEC) 10 MG tablet, Take 10 mg by mouth daily., Disp: , Rfl:    Continuous Blood Gluc Sensor (FREESTYLE LIBRE 2 SENSOR) MISC, APPLY EVERY 14 DAYS, Disp: 2 each, Rfl: 5   glucose blood test strip, Check sugar 2 times daily., Disp: 100 each, Rfl: 12    hydrOXYzine  (ATARAX ) 10 MG tablet, Take 1 tablet (10 mg total) by mouth 3 (three) times daily as needed., Disp: 30 tablet, Rfl: 0   Insulin  Degludec (TRESIBA  FLEXTOUCH [place]), Inject into the skin. 12-18 Units, Disp: , Rfl:    insulin  lispro (HUMALOG  KWIKPEN) 200 UNIT/ML KwikPen, Humalog  8 units with low carb meals (or use 1:7 ratio )  14 units with high carb meals (and high carb snacks)  Correction scale Blood sugar 30 minutes if BG  0- 149 no insulin  150 - 199 1 unit  200 - 249 2 units 250 - 299 3 units 300 - 349 4 units 350 - 399 5 units > 400 6 units and contact clinic [phone], Disp: , Rfl:    Insulin  Pen Needle (BD PEN NEEDLE NANO U/F) 32G X 4 MM MISC, USE TO INJECT INSULIN  THREE TIMES A DAY, Disp: 270 each, Rfl: 3   lidocaine -prilocaine  (EMLA ) cream, Apply 1 Application topically as directed. Apply 1 hour prior to stick and cover with plastic wrap, Disp: 30 g, Rfl: 2   magnesium  oxide (MAG-OX) 400 (240 Mg) MG tablet, TAKE 1 TABLET(400 MG) BY MOUTH TWICE DAILY, Disp: 180 tablet, Rfl: 1   Polyethylene Glycol 3350 (MIRALAX PO), Take 17 g by mouth daily., Disp: , Rfl:    potassium chloride  SA (KLOR-CON  M) 20 MEQ tablet, TAKE 1 TABLET(20 MEQ) BY MOUTH FOUR TIMES DAILY, Disp: 360 tablet, Rfl: 1   rosuvastatin  (CRESTOR ) 10 MG tablet, Take 1 tablet (10 mg total) by mouth 3 (three) times a week., Disp: 90 tablet, Rfl: 3   spironolactone  (ALDACTONE ) 25 MG tablet, TAKE 1 TABLET(25 MG) BY MOUTH DAILY, Disp: 90 tablet, Rfl: 2   ondansetron  (ZOFRAN ) 8 MG tablet, Take 1 tablet (8 mg total) by mouth every 8 (eight) hours as needed for nausea or vomiting (do not begin until 72 hours after chemo). (Patient not taking: Reported on 06/21/2024), Disp: 20 tablet, Rfl: 3   prochlorperazine  (COMPAZINE ) 10 MG tablet, TAKE 1 TABLET(10 MG) BY MOUTH EVERY 6 HOURS AS NEEDED FOR NAUSEA OR VOMITING (Patient not taking: Reported on 06/21/2024), Disp: 60 tablet, Rfl: 2   valsartan  (DIOVAN ) 160 MG tablet, Take 1 tablet (160 mg  total) by mouth daily., Disp: 90 tablet, Rfl: 3 No current facility-administered medications for this visit.  Facility-Administered Medications Ordered in Other Visits:    sodium chloride  flush (NS) 0.9 % injection 10 mL, 10 mL, Intravenous, PRN, Debby Olam POUR, NP, 10 mL at 10/24/22 1311   Allergies  Allergen Reactions   Cherry Rash, Itching and Other (See Comments)    Other reaction(s): Unknown Other reaction(s): Unknown   Lemon Oil Rash     Review of Systems  Constitutional: Negative.   Eyes:  Negative for blurred vision.  Respiratory: Negative.  Cardiovascular: Negative.   Gastrointestinal: Negative.   Endocrine: Negative for polydipsia, polyphagia and polyuria.  Neurological: Negative.   Psychiatric/Behavioral: Negative.       Today's Vitals   06/21/24 1029  BP: 124/80  Pulse: 66  Temp: 98.8 F (37.1 C)  SpO2: 98%  Weight: 191 lb 6.4 oz (86.8 kg)  Height: 5' 8 (1.727 m)   Body mass index is 29.1 kg/m.  Wt Readings from Last 3 Encounters:  06/24/24 190 lb 6.4 oz (86.4 kg)  06/21/24 191 lb 6.4 oz (86.8 kg)  06/11/24 186 lb 4.8 oz (84.5 kg)    The ASCVD Risk score (Arnett DK, et al., 2019) failed to calculate for the following reasons:   The valid total cholesterol range is 130 to 320 mg/dL  Objective:  Physical Exam Vitals and nursing note reviewed.  Constitutional:      Appearance: Normal appearance.  HENT:     Head: Normocephalic and atraumatic.  Eyes:     Extraocular Movements: Extraocular movements intact.  Cardiovascular:     Rate and Rhythm: Normal rate and regular rhythm.     Heart sounds: Normal heart sounds.  Pulmonary:     Effort: Pulmonary effort is normal.     Breath sounds: Normal breath sounds.  Musculoskeletal:     Cervical back: Normal range of motion.  Skin:    General: Skin is warm.  Neurological:     General: No focal deficit present.     Mental Status: She is alert.  Psychiatric:        Mood and Affect: Mood normal.         Behavior: Behavior normal.         Assessment And Plan:   Assessment & Plan Uncontrolled type 2 diabetes mellitus with hyperglycemia, with long-term current use of insulin  (HCC) Chronic, glucose readings are not controlled.  [facility] input is appreciated. On Tresiba  and Humalog  with sliding scale. Discussed insulin  dosage adjustment due to high glucose levels. Uses 1:7 insulin -to-carb ratio with inconsistent carb counting. - Continue Tresiba  as per Endo - Continue Humalog  with sliding scale based on carbohydrate intake. - I will abstract her A1c once drawn tomorrow at [facility].   Dyslipidemia associated with type 2 diabetes mellitus (HCC) Chronic, LDL goal is less than 70.  Essential hypertension Chronic, well controlled. Blood pressure well-managed on valsartan  160 mg, taking half a pill as needed. Edema likely due to dietary sodium intake, managed with spironolactone  as needed. - Continue valsartan  160 mg, taking half a pill as needed. - Continue spironolactone  as needed for edema.  Orders Placed This Encounter  Procedures   Urine Albumin /Creatinine with ratio (send out) [LAB689]   Lipid panel     Return if symptoms worsen or fail to improve.  Patient was given opportunity to ask questions. Patient verbalized understanding of the plan and was able to repeat key elements of the plan. All questions were answered to their satisfaction.    I, Alyssa LOISE Slocumb, MD, have reviewed all documentation for this visit. The documentation on 06/21/2024 for the exam, diagnosis, procedures, and orders are all accurate and complete.   IF YOU HAVE BEEN REFERRED TO A SPECIALIST, IT MAY TAKE 1-2 WEEKS TO SCHEDULE/PROCESS THE REFERRAL. IF YOU HAVE NOT HEARD FROM US /SPECIALIST IN TWO WEEKS, PLEASE GIVE US  A CALL AT [phone].

## 2024-06-21 NOTE — Patient Instructions (Signed)

## 2024-06-21 NOTE — Telephone Encounter (Signed)
 PT refused treatment appt, appt was canceled.

## 2024-06-21 NOTE — Assessment & Plan Note (Addendum)
 Chronic, well controlled. Blood pressure well-managed on valsartan  160 mg, taking half a pill as needed. Edema likely due to dietary sodium intake, managed with spironolactone  as needed. - Continue valsartan  160 mg, taking half a pill as needed. - Continue spironolactone  as needed for edema.

## 2024-06-22 ENCOUNTER — Ambulatory Visit: Payer: Self-pay | Admitting: Internal Medicine

## 2024-06-22 LAB — MICROALBUMIN / CREATININE URINE RATIO
Creatinine, Urine: 98.9 mg/dL
Microalb/Creat Ratio: 5 mg/g{creat} (ref 0–29)
Microalbumin, Urine: 4.9 ug/mL

## 2024-06-22 LAB — HEMOGLOBIN A1C: Hemoglobin A1C: 9

## 2024-06-22 LAB — OPHTHALMOLOGY REPORT-SCANNED

## 2024-06-23 ENCOUNTER — Encounter: Payer: Self-pay | Admitting: Oncology

## 2024-06-23 NOTE — Progress Notes (Signed)
.  err

## 2024-06-24 ENCOUNTER — Encounter: Payer: Self-pay | Admitting: Nurse Practitioner

## 2024-06-24 ENCOUNTER — Inpatient Hospital Stay

## 2024-06-24 VITALS — BP 136/79 | HR 63 | Temp 97.9°F | Resp 18 | Wt 190.4 lb

## 2024-06-24 DIAGNOSIS — Z515 Encounter for palliative care: Secondary | ICD-10-CM

## 2024-06-24 DIAGNOSIS — R530 Neoplastic (malignant) related fatigue: Secondary | ICD-10-CM

## 2024-06-24 DIAGNOSIS — D3A8 Other benign neuroendocrine tumors: Secondary | ICD-10-CM

## 2024-06-24 NOTE — Progress Notes (Signed)
 "    Palliative Medicine [facility]  [phone] [phone]) [phone]   Name: Alyssa Ross Date: 06/24/2024 MRN: [id number]  DOB: 15-Aug-1965  Patient Care Team: Alyssa Medici, MD as PCP - General (Internal Medicine) Alyssa Morrison, RN (Inactive) as Oncology Nurse Navigator Ross, Alyssa SAILOR, NP as Nurse Practitioner (Hospice and Palliative Medicine) Alyssa Ross, Alyssa Ross as Pharmacist (Pharmacist)   INTERVAL HISTORY: Alyssa Ross is a 59 y.o. female with oncologic medical history including pancreatic cancer (01/2019). The patient is 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 and is currently receiving FOLFIRI with liposomal irinotecan . Palliative ask to see for symptom management and goals of care.   SOCIAL HISTORY:     reports that she has never smoked. She has never used smokeless tobacco. She reports that she does not currently use alcohol. She reports that she does not use drugs.  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  MEDICATIONS:  Current Outpatient Medications  Medication Sig Dispense Refill   apixaban  (ELIQUIS ) 5 MG TABS tablet Take 1 tablet (5 mg total) by mouth 2  (two) times daily. 180 tablet 2   cetirizine (ZYRTEC) 10 MG tablet Take 10 mg by mouth daily.     Continuous Blood Gluc Sensor (FREESTYLE LIBRE 2 SENSOR) MISC APPLY EVERY 14 DAYS 2 each 5   glucose blood test strip Check sugar 2 times daily. 100 each 12   hydrOXYzine  (ATARAX ) 10 MG tablet Take 1 tablet (10 mg total) by mouth 3 (three) times daily as needed. 30 tablet 0   Insulin  Degludec (TRESIBA  FLEXTOUCH [place]) Inject into the skin. 12-18 Units     insulin  lispro (HUMALOG  KWIKPEN) 200 UNIT/ML KwikPen Humalog  8 units with low carb meals (or use 1:7 ratio )  14 units with high carb meals (and high carb snacks)  Correction scale Blood sugar 30 minutes if BG  0- 149 no insulin  150 - 199 1 unit  200 - 249 2 units 250 - 299 3 units 300 - 349 4 units 350 - 399 5 units > 400 6 units and contact clinic [phone]     Insulin  Pen Needle (BD PEN NEEDLE NANO U/F) 32G X 4 MM MISC USE TO INJECT INSULIN  THREE TIMES A DAY 270 each 3   lidocaine -prilocaine  (EMLA ) cream Apply 1 Application topically as directed. Apply 1 hour prior to stick and cover with plastic wrap 30 g 2   magnesium  oxide (MAG-OX) 400 (240 Mg) MG tablet TAKE 1 TABLET(400 MG) BY MOUTH TWICE DAILY 180 tablet 1   ondansetron  (ZOFRAN ) 8 MG tablet Take 1 tablet (8 mg total) by mouth every 8 (  eight) hours as needed for nausea or vomiting (do not begin until 72 hours after chemo). (Patient not taking: Reported on 06/21/2024) 20 tablet 3   Polyethylene Glycol 3350 (MIRALAX PO) Take 17 g by mouth daily.     potassium chloride  SA (KLOR-CON  M) 20 MEQ tablet TAKE 1 TABLET(20 MEQ) BY MOUTH FOUR TIMES DAILY 360 tablet 1   prochlorperazine  (COMPAZINE ) 10 MG tablet TAKE 1 TABLET(10 MG) BY MOUTH EVERY 6 HOURS AS NEEDED FOR NAUSEA OR VOMITING (Patient not taking: Reported on 06/21/2024) 60 tablet 2   rosuvastatin  (CRESTOR ) 10 MG tablet Take 1 tablet (10 mg total) by mouth 3 (three) times a week. 90 tablet 3   spironolactone  (ALDACTONE ) 25 MG tablet TAKE 1  TABLET(25 MG) BY MOUTH DAILY 90 tablet 2   valsartan  (DIOVAN ) 160 MG tablet Take 1 tablet (160 mg total) by mouth daily. 90 tablet 3   No current facility-administered medications for this visit.   Facility-Administered Medications Ordered in Other Visits  Medication Dose Route Frequency Provider Last Rate Last Admin   sodium chloride  flush (NS) 0.9 % injection 10 mL  10 mL Intravenous PRN [name], Lisa K, NP   10 mL at 10/24/22 1311    VITAL SIGNS: BP 136/79 (BP Location: Right Arm, Patient Position: Sitting, Cuff Size: Normal)   Pulse 63   Temp 97.9 F (36.6 C) (Temporal)   Resp 18   Wt 190 lb 6.4 oz (86.4 kg)   LMP 09/17/2012   SpO2 100%   BMI 28.95 kg/m  Filed Weights   06/24/24 1129  Weight: 190 lb 6.4 oz (86.4 kg)    Estimated body mass index is 28.95 kg/m as calculated from the following:   Height as of 06/21/24: 5' 8 (1.727 m).   Weight as of this encounter: 190 lb 6.4 oz (86.4 kg).   PERFORMANCE STATUS (ECOG) : 1 - Symptomatic but completely ambulatory  IMPRESSION: Discussed the use of AI scribe software for clinical note transcription with the patient, who gave verbal consent to proceed.  History of Present Illness Alyssa Ross is a 59 year old female with pancreatic cancer who presents to clinic for symptom management follow-up and goals of care discussions. She reports ongoing challenges with chronic fatigue and difficulty managing blood sugar levels.  She experiences chronic fatigue, which she attributes somewhat to low blood sugar levels. Her blood sugar is difficult to manage, often fluctuating between high and low. Fatigue worsens when her blood sugar is low, particularly in the evenings and throughout the night, disrupting her sleep. On nights when her blood sugar is stable, she feels less tired the following day.  She faces challenges with appetite and eating, sometimes skipping meals, particularly breakfast, not due to lack of hunger but because eating feels  like a chore. She is concerned about finding foods that do not upset her stomach and does not consume protein drinks. Discussions had in the past regarding use of Creon but is hesitant to use it due to potential side effects. We also discussed efficacy of digestive pancreatic enzymes.   She describes gastrointestinal symptoms, including frequent bowel movements and offensive gas. She reports that she eats fish regularly. She reports oily stools when consuming fatty foods.  Alyssa Ross has been off her cancer treatments since before Christmas but is scheduled to resume next week after following up with [facility] provider who made further suggestions. Her CA19-9 levels fluctuated with persistent increase over the past several cycles. She shares recent imaging did not  warrant the results she were hopeful for. Alyssa Ross is clear in her understanding of cancer diagnosis and levels of available treatment including clinical trials and circling back to her initial regimen. She is remaining hopeful for stability as she did in months past.   She mentions a recent fall in the parking lot at work, which she attributes to losing her balance while carrying items. She reports shoulder pain following the fall but no significant injuries. Fatigue contributes to her balance issues at times.  Her condition impacts her social life and relationships, controlling many aspects of her life, including her ability to engage in social activities and maintain relationships. She desires companionship but acknowledges her limitations in being a partner. Emotional support provided.   All questions answered and support provided. Patient aware we are available as needed.   Goals of Care Patient has completed advanced directives naming Alyssa Ross and Alyssa Ross as her medical decision makers in the event she is unable to speak for herself.  No desire for artificial feedings.  No life prolonging measures in the event of a terminal state.  I  discussed the importance of continued conversation with family and their medical providers regarding overall plan of care and treatment options, ensuring decisions are within the context of the patients values and GOCs. Assessment & Plan Pancreatic cancer Ongoing treatment challenges. Recent scans showed suboptimal results. She has been off treatment since before Christmas but is scheduled to resume next week. Previous treatment regimen was every three weeks, but she experienced neutropenia and treatment delays. Current plan is to resume treatment every other week. She is considering a clinical trial or other options for treatment as needed after recent follow-up with [facility] but is concerned about side effects and long-term outcomes. She is informed about potential side effects of targeted therapy, including rashes and immune system impacts, and is seeking more information before deciding on participating in a trial if ever needed. - Resume treatment every other week starting next week per discussions and under management of Oncology.   Exocrine pancreatic insufficiency Causing difficulty in digesting fats and proteins, leading to frequent bowel movements and malabsorption. She is hesitant to use Creon due to concerns about side effects and the need for daily administration. Over-the-counter pancreatic enzyme supplements are being considered as an alternative. - Try over-the-counter pancreatic enzyme supplements with meals that typically cause digestive issues. - Will consider obtaining Creon samples to assess effectiveness before pursuing insurance coverage.  Diabetes mellitus with hypoglycemia and hyperglycemia Diabetes management is challenging due to fluctuating blood glucose levels, contributing to chronic fatigue. She experiences both hypoglycemia and hyperglycemia, affecting her daily activities and energy levels. She is not consistently eating due to the effort required to manage her diet and blood  sugar levels. - Consider using protein drinks or pre-made meals to help maintain blood sugar levels. - Monitor blood glucose levels closely and adjust insulin  administration as needed.  Chronic fatigue secondary to cancer and metabolic derangements Chronic fatigue is likely multifactorial, related to cancer, chemotherapy, and blood sugar fluctuations. She reports significant fatigue, especially in the afternoon, impacting her daily activities and work performance. - Continue to monitor blood sugar levels and manage diabetes to potentially improve fatigue. - Encouraged maintaining a structured daily routine to help manage fatigue.  Bowel incontinence Likely related to pancreatic insufficiency and dietary intake. She experiences urgency and frequent bowel movements, impacting her quality of life and social interactions. - Consider dietary modifications to manage bowel movements, such as avoiding high-fat  foods. - Try pancreatic enzyme supplements to improve digestion and reduce bowel incontinence.  Goals of Care  Patient is clear and expressed wishes to continue to treat the treatable aggressively allowing her every opportunity to show signs of stability.  I will plan to follow-up with patient in 4 to 6 weeks.  Sooner if needed.  Patient expressed understanding and was in agreement with this plan. She also understands that She can call the clinic at any time with any questions, concerns, or complaints.   I personally spent a total of 45 minutes in the care of the patient today including preparing to see the patient, getting/reviewing separately obtained history, performing a medically appropriate exam/evaluation, counseling and educating, documenting clinical information in the EHR, independently interpreting results, and coordinating care.  Visit consisted of counseling and education dealing with the complex and emotionally intense issues of symptom management and palliative care in the setting  of serious and potentially life-threatening illness.  Levon Borer, AGPCNP-BC  Palliative Medicine Team/[facility]    "

## 2024-06-27 ENCOUNTER — Other Ambulatory Visit: Payer: Self-pay | Admitting: Oncology

## 2024-06-27 NOTE — Progress Notes (Incomplete)
 I,Alyssa Ross, CMA,acting as a neurosurgeon for Alyssa LOISE Slocumb, MD.,have documented all relevant documentation on the behalf of Alyssa LOISE Slocumb, MD,as directed by  Alyssa LOISE Slocumb, MD while in the presence of Alyssa LOISE Slocumb, MD.  Subjective:  Patient ID: Alyssa Ross , female    DOB: 1966/01/30 , 59 y.o.   MRN: [id number]  Chief Complaint  Patient presents with   Diabetes    She is here today for BP/DM check. She is followed by [facility] for diabetes med management. She reports compliance with meds. She reports she will not complete cologuard that is due. She also has visit with endocrinologist tomorrow.  Denies completing pap/ mammogram.    Hypertension   Hyperlipidemia   Discussed the use of AI scribe software for clinical note transcription with the patient, who gave verbal consent to proceed.  History of Present Illness     Hypertension This is a chronic problem. The current episode started more than 1 year ago. The problem has been gradually improving since onset. The problem is uncontrolled. Pertinent negatives include no blurred vision. Past treatments include ACE inhibitors and diuretics. The current treatment provides moderate improvement.  Diabetes She presents for her follow-up diabetic visit. She has type 2 diabetes mellitus. There are no hypoglycemic associated symptoms. Pertinent negatives for diabetes include no blurred vision, no polydipsia, no polyphagia and no polyuria. There are no hypoglycemic complications. Risk factors for coronary artery disease include diabetes mellitus, hypertension and dyslipidemia. Current diabetic treatment includes insulin  injections. She is compliant with treatment most of the time. She is following a diabetic diet. She participates in exercise intermittently. Eye exam is current.     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   Yeast vaginitis 2006     Family History  Problem Relation Age of Onset   Diabetes Mother    Dementia Mother    Hyperlipidemia Mother    Hypertension Mother    Irregular heart beat Mother    Diabetes Father    Hypertension Father    Hyperlipidemia Father    Down syndrome Sister    Diabetes Sister    Hyperlipidemia Brother    Heart Problems Brother    Goiter Maternal Aunt    Thyroid  nodules Sister    Cancer Cousin 60       eye; maternal first cousin   Goiter Cousin    Cancer Paternal Aunt        unknown form of cancer   Cancer Cousin        unknown form of cancer; paternal first cousin   Pancreatic cancer Cousin 53       paternal first cousin   Cancer Cousin 24       unknown cancer; paternal first cousin   Cancer Cousin 55  unknown cancer; paternal first cousin     Current Outpatient Medications:    apixaban  (ELIQUIS ) 5 MG TABS tablet, Take 1 tablet (5 mg total) by mouth 2 (two) times daily., Disp: 180 tablet, Rfl: 2   cetirizine (ZYRTEC) 10 MG tablet, Take 10 mg by mouth daily., Disp: , Rfl:    Continuous Blood Gluc Sensor (FREESTYLE LIBRE 2 SENSOR) MISC, APPLY EVERY 14 DAYS, Disp: 2 each, Rfl: 5   glucose blood test strip, Check sugar 2 times daily., Disp: 100 each, Rfl: 12   hydrOXYzine  (ATARAX ) 10 MG tablet, Take 1 tablet (10 mg total) by mouth 3 (three) times daily as needed., Disp: 30 tablet, Rfl: 0   Insulin  Degludec  (TRESIBA  FLEXTOUCH [place]), Inject into the skin. 12-18 Units, Disp: , Rfl:    insulin  lispro (HUMALOG  KWIKPEN) 200 UNIT/ML KwikPen, Humalog  8 units with low carb meals (or use 1:7 ratio )  14 units with high carb meals (and high carb snacks)  Correction scale Blood sugar 30 minutes if BG  0- 149 no insulin  150 - 199 1 unit  200 - 249 2 units 250 - 299 3 units 300 - 349 4 units 350 - 399 5 units > 400 6 units and contact clinic [phone], Disp: , Rfl:    Insulin  Pen Needle (BD PEN NEEDLE NANO U/F) 32G X 4 MM MISC, USE TO INJECT INSULIN  THREE TIMES A DAY, Disp: 270 each, Rfl: 3   lidocaine -prilocaine  (EMLA ) cream, Apply 1 Application topically as directed. Apply 1 hour prior to stick and cover with plastic wrap, Disp: 30 g, Rfl: 2   magnesium  oxide (MAG-OX) 400 (240 Mg) MG tablet, TAKE 1 TABLET(400 MG) BY MOUTH TWICE DAILY, Disp: 180 tablet, Rfl: 1   Polyethylene Glycol 3350 (MIRALAX PO), Take 17 g by mouth daily., Disp: , Rfl:    potassium chloride  SA (KLOR-CON  M) 20 MEQ tablet, TAKE 1 TABLET(20 MEQ) BY MOUTH FOUR TIMES DAILY, Disp: 360 tablet, Rfl: 1   rosuvastatin  (CRESTOR ) 10 MG tablet, Take 1 tablet (10 mg total) by mouth 3 (three) times a week., Disp: 90 tablet, Rfl: 3   spironolactone  (ALDACTONE ) 25 MG tablet, TAKE 1 TABLET(25 MG) BY MOUTH DAILY, Disp: 90 tablet, Rfl: 2   ondansetron  (ZOFRAN ) 8 MG tablet, Take 1 tablet (8 mg total) by mouth every 8 (eight) hours as needed for nausea or vomiting (do not begin until 72 hours after chemo). (Patient not taking: Reported on 06/21/2024), Disp: 20 tablet, Rfl: 3   prochlorperazine  (COMPAZINE ) 10 MG tablet, TAKE 1 TABLET(10 MG) BY MOUTH EVERY 6 HOURS AS NEEDED FOR NAUSEA OR VOMITING (Patient not taking: Reported on 06/21/2024), Disp: 60 tablet, Rfl: 2   valsartan  (DIOVAN ) 160 MG tablet, Take 1 tablet (160 mg total) by mouth daily., Disp: 90 tablet, Rfl: 3 No current facility-administered medications for this visit.  Facility-Administered  Medications Ordered in Other Visits:    sodium chloride  flush (NS) 0.9 % injection 10 mL, 10 mL, Intravenous, PRN, Debby Olam POUR, NP, 10 mL at 10/24/22 1311   Allergies  Allergen Reactions   Cherry Rash, Itching and Other (See Comments)    Other reaction(s): Unknown Other reaction(s): Unknown   Lemon Oil Rash     Review of Systems  Constitutional: Negative.   Eyes:  Negative for blurred vision.  Respiratory: Negative.    Cardiovascular: Negative.   Endocrine: Negative for polydipsia, polyphagia and polyuria.  Neurological: Negative.   Psychiatric/Behavioral: Negative.       Today's Vitals  06/21/24 1029  BP: 124/80  Pulse: 66  Temp: 98.8 F (37.1 C)  SpO2: 98%  Weight: 191 lb 6.4 oz (86.8 kg)  Height: 5' 8 (1.727 m)   Body mass index is 29.1 kg/m.  Wt Readings from Last 3 Encounters:  06/24/24 190 lb 6.4 oz (86.4 kg)  06/21/24 191 lb 6.4 oz (86.8 kg)  06/11/24 186 lb 4.8 oz (84.5 kg)    The ASCVD Risk score (Arnett DK, et al., 2019) failed to calculate for the following reasons:   The valid total cholesterol range is 130 to 320 mg/dL  Objective:  Physical Exam Vitals and nursing note reviewed.  Constitutional:      Appearance: Normal appearance.  HENT:     Head: Normocephalic and atraumatic.  Eyes:     Extraocular Movements: Extraocular movements intact.  Cardiovascular:     Rate and Rhythm: Normal rate and regular rhythm.     Heart sounds: Normal heart sounds.  Pulmonary:     Effort: Pulmonary effort is normal.     Breath sounds: Normal breath sounds.  Musculoskeletal:     Cervical back: Normal range of motion.  Skin:    General: Skin is warm.  Neurological:     General: No focal deficit present.     Mental Status: She is alert.  Psychiatric:        Mood and Affect: Mood normal.        Behavior: Behavior normal.         Assessment And Plan:   Assessment & Plan Uncontrolled type 2 diabetes mellitus with hyperglycemia, with long-term  current use of insulin  (HCC) Chronic, glucose readings are not controlled.  [facility] input is appreciated. On Tresiba  and Humalog  with sliding scale. Discussed insulin  dosage adjustment due to high glucose levels. Uses 1:7 insulin -to-carb ratio with inconsistent carb counting. - Continue Tresiba  as per Endo - Continue Humalog  with sliding scale based on carbohydrate intake. - I will abstract her A1c once drawn tomorrow at [facility].   Dyslipidemia associated with type 2 diabetes mellitus (HCC) Chronic, LDL goal is less than 70.  Essential hypertension Chronic, well controlled. Blood pressure well-managed on valsartan  160 mg, taking half a pill as needed. Edema likely due to dietary sodium intake, managed with spironolactone  as needed. - Continue valsartan  160 mg, taking half a pill as needed. - Continue spironolactone  as needed for edema.  Orders Placed This Encounter  Procedures   Urine Albumin /Creatinine with ratio (send out) [LAB689]   Lipid panel     Return if symptoms worsen or fail to improve.  Patient was given opportunity to ask questions. Patient verbalized understanding of the plan and was able to repeat key elements of the plan. All questions were answered to their satisfaction.    I, Alyssa LOISE Slocumb, MD, have reviewed all documentation for this visit. The documentation on 06/21/2024 for the exam, diagnosis, procedures, and orders are all accurate and complete.   IF YOU HAVE BEEN REFERRED TO A SPECIALIST, IT MAY TAKE 1-2 WEEKS TO SCHEDULE/PROCESS THE REFERRAL. IF YOU HAVE NOT HEARD FROM US /SPECIALIST IN TWO WEEKS, PLEASE GIVE US  A CALL AT [phone].

## 2024-06-28 ENCOUNTER — Encounter: Payer: Self-pay | Admitting: *Deleted

## 2024-06-28 NOTE — Assessment & Plan Note (Signed)
Chronic, LDL goal is less than 70.

## 2024-06-28 NOTE — Assessment & Plan Note (Signed)
 Chronic, glucose readings are not controlled.  [facility] input is appreciated. On Tresiba  and Humalog  with sliding scale. Discussed insulin  dosage adjustment due to high glucose levels. Uses 1:7 insulin -to-carb ratio with inconsistent carb counting. - Continue Tresiba  as per Endo - Continue Humalog  with sliding scale based on carbohydrate intake. - I will abstract her A1c once drawn tomorrow at [facility].

## 2024-06-28 NOTE — Progress Notes (Signed)
 Another fax sent to [facility] @[phone] for consult notes, imaging and lab notes.

## 2024-06-30 ENCOUNTER — Inpatient Hospital Stay: Admitting: Nurse Practitioner

## 2024-06-30 ENCOUNTER — Inpatient Hospital Stay: Admitting: Oncology

## 2024-06-30 ENCOUNTER — Inpatient Hospital Stay

## 2024-06-30 ENCOUNTER — Encounter: Payer: Self-pay | Admitting: *Deleted

## 2024-06-30 VITALS — BP 150/84 | HR 70 | Temp 98.6°F | Resp 18

## 2024-06-30 VITALS — BP 144/86 | HR 65 | Temp 97.8°F | Resp 18 | Ht 68.0 in | Wt 190.5 lb

## 2024-06-30 DIAGNOSIS — C250 Malignant neoplasm of head of pancreas: Secondary | ICD-10-CM

## 2024-06-30 DIAGNOSIS — C3411 Malignant neoplasm of upper lobe, right bronchus or lung: Secondary | ICD-10-CM | POA: Diagnosis not present

## 2024-06-30 LAB — CBC WITH DIFFERENTIAL (CANCER CENTER ONLY)
Abs Immature Granulocytes: 0 K/uL (ref 0.00–0.07)
Basophils Absolute: 0 K/uL (ref 0.0–0.1)
Basophils Relative: 1 %
Eosinophils Absolute: 0.1 K/uL (ref 0.0–0.5)
Eosinophils Relative: 3 %
HCT: 33.4 % — ABNORMAL LOW (ref 36.0–46.0)
Hemoglobin: 10.7 g/dL — ABNORMAL LOW (ref 12.0–15.0)
Immature Granulocytes: 0 %
Lymphocytes Relative: 30 %
Lymphs Abs: 1.2 K/uL (ref 0.7–4.0)
MCH: 32 pg (ref 26.0–34.0)
MCHC: 32 g/dL (ref 30.0–36.0)
MCV: 100 fL (ref 80.0–100.0)
Monocytes Absolute: 0.4 K/uL (ref 0.1–1.0)
Monocytes Relative: 9 %
Neutro Abs: 2.3 K/uL (ref 1.7–7.7)
Neutrophils Relative %: 57 %
Platelet Count: 293 K/uL (ref 150–400)
RBC: 3.34 MIL/uL — ABNORMAL LOW (ref 3.87–5.11)
RDW: 14.2 % (ref 11.5–15.5)
WBC Count: 4.1 K/uL (ref 4.0–10.5)
nRBC: 0 % (ref 0.0–0.2)

## 2024-06-30 LAB — CMP (CANCER CENTER ONLY)
ALT: 48 U/L — ABNORMAL HIGH (ref 0–44)
AST: 53 U/L — ABNORMAL HIGH (ref 15–41)
Albumin: 3.2 g/dL — ABNORMAL LOW (ref 3.5–5.0)
Alkaline Phosphatase: 519 U/L — ABNORMAL HIGH (ref 38–126)
Anion gap: 7 (ref 5–15)
BUN: 5 mg/dL — ABNORMAL LOW (ref 6–20)
CO2: 27 mmol/L (ref 22–32)
Calcium: 8.6 mg/dL — ABNORMAL LOW (ref 8.9–10.3)
Chloride: 106 mmol/L (ref 98–111)
Creatinine: 0.63 mg/dL (ref 0.44–1.00)
GFR, Estimated: >60 mL/min
Glucose, Bld: 214 mg/dL — ABNORMAL HIGH (ref 70–99)
Potassium: 3.5 mmol/L (ref 3.5–5.1)
Sodium: 140 mmol/L (ref 135–145)
Total Bilirubin: 0.5 mg/dL (ref 0.0–1.2)
Total Protein: 6.5 g/dL (ref 6.5–8.1)

## 2024-06-30 LAB — MAGNESIUM: Magnesium: 1.8 mg/dL (ref 1.7–2.4)

## 2024-06-30 MED ORDER — SODIUM CHLORIDE 0.9 % IV SOLN
400.0000 mg/m2 | Freq: Once | INTRAVENOUS | Status: AC
  Administered 2024-06-30: 776 mg via INTRAVENOUS
  Filled 2024-06-30: qty 38.8

## 2024-06-30 MED ORDER — SODIUM CHLORIDE 0.9 % IV SOLN: INTRAVENOUS | Status: DC

## 2024-06-30 MED ORDER — SODIUM CHLORIDE 0.9 % IV SOLN
55.0000 mg/m2 | Freq: Once | INTRAVENOUS | Status: AC
  Administered 2024-06-30: 107.5 mg via INTRAVENOUS
  Filled 2024-06-30: qty 25

## 2024-06-30 MED ORDER — DEXAMETHASONE SODIUM PHOSPHATE 10 MG/ML IJ SOLN
10.0000 mg | Freq: Once | INTRAMUSCULAR | Status: AC
  Administered 2024-06-30: 10 mg via INTRAVENOUS

## 2024-06-30 MED ORDER — PROCHLORPERAZINE MALEATE 10 MG PO TABS: ORAL_TABLET | ORAL | 2 refills | Status: AC

## 2024-06-30 MED ORDER — ONDANSETRON HCL 8 MG PO TABS: 8.0000 mg | ORAL_TABLET | Freq: Three times a day (TID) | ORAL | 3 refills | Status: AC | PRN

## 2024-06-30 MED ORDER — PALONOSETRON HCL INJECTION 0.25 MG/5ML
0.2500 mg | Freq: Once | INTRAVENOUS | Status: AC
  Administered 2024-06-30: 0.25 mg via INTRAVENOUS
  Filled 2024-06-30: qty 5

## 2024-06-30 MED ORDER — ATROPINE SULFATE 1 MG/ML IV SOLN
0.5000 mg | Freq: Once | INTRAVENOUS | Status: AC | PRN
  Administered 2024-06-30: 0.5 mg via INTRAVENOUS
  Filled 2024-06-30: qty 1

## 2024-06-30 MED ORDER — SODIUM CHLORIDE 0.9 % IV SOLN
2400.0000 mg/m2 | INTRAVENOUS | Status: DC
  Administered 2024-06-30: 5000 mg via INTRAVENOUS
  Filled 2024-06-30: qty 100

## 2024-06-30 NOTE — Progress Notes (Signed)
 Called and spoke with [facility] regarding insufficient tissue for RNA sequencing. 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 Dr Cloretta informed

## 2024-06-30 NOTE — Patient Instructions (Signed)
 [facility] - A DEPT OF [facility]  Discharge Instructions: Thank you for choosing [facility] to provide your oncology and hematology care.   If you have a lab appointment with the Cancer Center, please go directly to the Cancer Center and check in at the registration area.   Wear comfortable clothing and clothing appropriate for easy access to any Portacath or PICC line.   We strive to give you quality time with your provider. You may need to reschedule your appointment if you arrive late (15 or more minutes).  Arriving late affects you and other patients whose appointments are after yours.  Also, if you miss three or more appointments without notifying the office, you may be dismissed from the clinic at the providers discretion.      For prescription refill requests, have your pharmacy contact our office and allow 72 hours for refills to be completed.    Today you received the following chemotherapy and/or immunotherapy agents: liposomal irinotecan , leucovorin , fluorouracil        To help prevent nausea and vomiting after your treatment, we encourage you to take your nausea medication as directed.  BELOW ARE SYMPTOMS THAT SHOULD BE REPORTED IMMEDIATELY: *FEVER GREATER THAN 100.4 F (38 C) OR HIGHER *CHILLS OR SWEATING *NAUSEA AND VOMITING THAT IS NOT CONTROLLED WITH YOUR NAUSEA MEDICATION *UNUSUAL SHORTNESS OF BREATH *UNUSUAL BRUISING OR BLEEDING *URINARY PROBLEMS (pain or burning when urinating, or frequent urination) *BOWEL PROBLEMS (unusual diarrhea, constipation, pain near the anus) TENDERNESS IN MOUTH AND THROAT WITH OR WITHOUT PRESENCE OF ULCERS (sore throat, sores in mouth, or a toothache) UNUSUAL RASH, SWELLING OR PAIN  UNUSUAL VAGINAL DISCHARGE OR ITCHING   Items with * indicate a potential emergency and should be followed up as soon as possible or go to the Emergency Department if any problems should occur.  Please show the  CHEMOTHERAPY ALERT CARD or IMMUNOTHERAPY ALERT CARD at check-in to the Emergency Department and triage nurse.  Should you have questions after your visit or need to cancel or reschedule your appointment, please contact [facility] - A DEPT OF [facility]  Dept: [phone]  and follow the prompts.  Office hours are 8:00 a.m. to 4:30 p.m. Monday - Friday. Please note that voicemails left after 4:00 p.m. may not be returned until the following business day.  We are closed weekends and major holidays. You have access to a nurse at all times for urgent questions. Please call the main number to the clinic Dept: [phone] and follow the prompts.   For any non-urgent questions, you may also contact your provider using MyChart. We now offer e-Visits for anyone 24 and older to request care online for non-urgent symptoms. For details visit [email or web address].   Also download the MyChart app! Go to the app store, search MyChart, open the app, select [facility], and log in with your MyChart username and password.

## 2024-06-30 NOTE — Patient Instructions (Signed)

## 2024-06-30 NOTE — Progress Notes (Signed)
 Patient seen by Dr. Arley Hof today  Vitals are within treatment parameters:Yes   Labs are within treatment parameters: Yes  OK to proceed without Mg+ result  Treatment plan has been signed: Yes   Per physician team, Patient is ready for treatment and there are NO modifications to the treatment plan.

## 2024-06-30 NOTE — Progress Notes (Signed)
 Called [facility] at phone number [phone] for consultation report from 2023 visit. Also have faxed [facility] medical records on 06/11/24 and 06/28/24 for records as well

## 2024-06-30 NOTE — Progress Notes (Signed)
 " [facility] OFFICE PROGRESS NOTE   Diagnosis: Pancreas cancer  INTERVAL HISTORY:   Ms. Mccaffrey returns as scheduled.  She reports malaise.  She has irregular bowel habits.  She began an over-the-counter digestive enzyme supplement.  She saw Dr. Donato on 06/16/2024.  They discussed clinical trials including a K-ras inhibitor trial.  Dr. Donato feels it is reasonable to continue 5-FU/liposomal irinotecan  on a 2-week schedule.  Objective:  Vital signs in last 24 hours:  Blood pressure (!) 144/86, pulse 65, temperature 97.8 F (36.6 C), temperature source Temporal, resp. rate 18, height 5' 8 (1.727 m), weight 190 lb 8 oz (86.4 kg), last menstrual period 09/17/2012, SpO2 100%.    HEENT: No thrush or ulcers Resp: Lungs clear bilaterally Cardio: Regular rate and rhythm GI: No hepatosplenomegaly, no mass, nontender Vascular: No leg edema  Portacath/PICC-without erythema  Lab Results:  Lab Results  Component Value Date   WBC 4.1 06/30/2024   HGB 10.7 (L) 06/30/2024   HCT 33.4 (L) 06/30/2024   MCV 100.0 06/30/2024   PLT 293 06/30/2024   NEUTROABS 2.3 06/30/2024    CMP  Lab Results  Component Value Date   NA 129 (L) 06/01/2024   K 3.8 06/01/2024   CL 92 (L) 06/01/2024   CO2 21 (L) 06/01/2024   GLUCOSE 365 (H) 06/01/2024   BUN 15 06/01/2024   CREATININE 0.83 06/01/2024   CALCIUM  9.3 06/01/2024   PROT 7.7 06/01/2024   ALBUMIN  3.7 06/01/2024   AST 27 06/01/2024   ALT 34 06/01/2024   ALKPHOS 491 (H) 06/01/2024   BILITOT 1.1 06/01/2024   GFRNONAA >60 06/01/2024   GFRAA >60 02/07/2020    Lab Results  Component Value Date   CAN199 285 (H) 05/04/2024    Lab Results  Component Value Date   INR 1.0 01/27/2023   LABPROT 13.6 01/27/2023    Imaging:  No results found.  Medications: I have reviewed the patient's current medications.   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 Tumor invades the  duodenal wall and vascular groove, resection margins negative, 4/34 lymph nodes positive MSI-stable, tumor showed instability in 2 loci as did adjacent normal tissue Foundation 1-K-ras G12 V, microsatellite status could not be determined, tumor mutation burden 0 EUS FNA biopsy of pancreas mass on 07/03/2018-well-differentiated neuroendocrine tumor CTs 01/29/2019-ill-defined pancreas head mass, 5 pulmonary nodules-1 with a small amount of central cavitation, tumor abuts the left margin of the portal vein indistinct density surrounding, hepatic artery, complex cystic lesion of the right kidney, right adrenal mass-characterized as an adenoma on a [facility] MRI 12/21/2018 [facility]  03/03/2019-no focal pancreas activity, no tracer accumulation within the suspicious pulmonary nodules, left uterine mass with tracer accumulation felt to represent a leiomyoma Elevated preoperative CA 19-9--CA 19-9 186 on 01/18/2019 CT chest 04/16/2019-multiple bilateral pulmonary nodules, some with increased cavitation, stable in size Cycle 1 FOLFIRINOX 04/27/2019 Cycle 2 FOLFIRINOX 05/11/2019 Cycle 3 FOLFIRINOX 05/23/2019 Cycle 4 FOLFIRINOX 06/08/2019 Cycle 5 FOLFIRINOX 06/22/2019 CT chest 07/02/2019-stable size of bilateral pulmonary nodules.  Dominant cavitary lesions in both lungs show increased cavitation with thinner walls.  Stable 2.1 cm right adrenal nodule. Cycle 6 FOLFIRINOX 07/06/2019 Cycle 7 FOLFIRINOX 07/21/2019 Cycle 8 FOLFIRINOX 08/03/2019, oxaliplatin  deleted secondary to neuropathy CT chest 08/24/2019-decreased size of several lung nodules with resolution of a left upper lobe nodule, no new nodules Radiation to the pancreas surgical area with concurrent Xeloda  09/13/2019-10/20/2019  CTs 11/29/2019-multiple small pulmonary nodules scattered throughout the lungs bilaterally, appear increased  in number and size. No definite evidence of metastatic disease in the abdomen or pelvis. Markedly enlarged and heterogeneous appearing  uterus, likely to represent multifocal fibroids. 1 of these lesions appears to be an exophytic subserosal fibroid in the posterior lateral aspect of the uterine body on the left side although this comes in close proximity to the left adnexa such that a primary ovarian lesion is difficult to completely exclude. CTs 02/07/2020-slight enlargement of bilateral lung nodules, some are cavitary, no evidence of metastatic disease in the abdomen or pelvis, stable right adrenal nodule, uterine fibroids CTs 04/26/2020-mild enlargement of pulmonary nodules, slight increase in trace pelvic fluid, new soft tissue thickening inferior to the cecal tip suspicious for peritoneal metastasis CT 05/26/2020-improved appearance of soft tissue at the inferior tip of the cecum, mildly thickened short appendix-findings suggestive of resolving appendicitis, stable small bibasilar pulmonary nodules, fibroids Plan biopsy of right cecal tip soft tissue canceled secondary to radiologic improvement CT chest 08/02/2020-enlargement and progressive cavitation of multiple bilateral lung nodules.  Some new nodules are present. CTs 10/24/2020- increase in size of pulmonary nodules, no new nodules, no evidence of metastatic disease in the abdomen, stable right adrenal nodule CT 01/09/2021-slight interval enlargement of pulmonary nodules, stable right adrenal nodule Navigation bronchoscopy 01/30/2021-left lower lobe cavitary nodule FNA-adenocarcinoma, brushing-adenocarcinoma.  Left lower lobe lavage-adenocarcinoma.  Right upper lobe brushing and FNA biopsy of cavitary nodule-adenocarcinoma-immunohistochemical profile consistent with pancreas adenocarcinoma Cycle 1 gemcitabine /Abraxane  03/28/2021 Cycle 2 gemcitabine /Abraxane  04/11/2021 Cycle 3 gemcitabine /Abraxane  04/25/2021 Cycle 4 gemcitabine /Abraxane  05/09/2021 Cycle 5 gemcitabine /Abraxane  05/23/2021 CT chest 06/05/2021-interval cavitation of multiple pulmonary nodules, some nodules have  decreased in size, no new or enlarging nodules Cycle 6 gemcitabine /Abraxane  06/06/2021 Cycle 7 gemcitabine /Abraxane  06/21/2021 Cycle 8 gemcitabine /Abraxane  07/05/2021 Cycle 9 Gemcitabine /Abraxane  07/19/2021 Cycle 10 gemcitabine  08/01/2021-Abraxane  held secondary to neuropathy CT chest 08/13/2021-mild decrease in size and wall thickness of multiple cavitary nodules, no new or progressive disease in the chest, indeterminate low-attenuation right liver lesions Cycle 11 gemcitabine  08/16/2021-Abraxane  held secondary to neuropathy Cycle 12 gemcitabine  08/30/2021-Abraxane  held secondary to neuropathy Cycle 13 gemcitabine  09/13/2021-Abraxane  held secondary to neuropathy Cycle 14 gemcitabine  09/27/2021-Abraxane  held secondary to neuropathy Cycle 15 gemcitabine  10/11/2021-Abraxane  held secondary to neuropathy CTs 10/22/2021-no change in multiple cavitary lung nodules, no evidence of disease progression, ill-defined hypodense lesion in the posterior right liver suspicious for metastatic disease Cycle 16 gemcitabine  10/25/2021 Cycle 17 gemcitabine  11/08/2021 Cycle 18 Gemcitabine  11/22/2021 Cycle 19 gemcitabine  12/06/2021 Cycle 20 Gemcitabine  12/20/2021 CT 12/31/2021-mild increase in size of bilateral pulmonary metastases, stable subtle continuation right liver lesions Cycle 20 gemcitabine /Abraxane  01/03/2022 Cycle 21 gemcitabine /Abraxane  01/17/2022 Cycle 22 gemcitabine /Abraxane  01/31/2022 Cycle 23 gemcitabine /Abraxane  02/14/2022 Cycle 24 gemcitabine /Abraxane  02/28/2022 CTs 03/11/2022-widespread metastatic disease to the lungs again noted with slight involution of several of the pulmonary nodules, no definite new nodules noted; interval cavitation of solid lesion in the posterior aspect right lobe of the liver, no new liver lesions noted. Cycle 25 Gemcitabine /Abraxane  03/14/2022 Cycle 26 gemcitabine /Abraxane  03/28/2022 Cycle 27 gemcitabine /Abraxane  04/25/2022 Cycle 28 gemcitabine /Abraxane  05/09/2022 Cycle 29 Gemcitabine   05/23/2022, Abraxane  held due to neuropathy CTs 06/04/2022-stable multifocal cavitary pulmonary nodules.  No definite new nodules.  No new nodules greater than a centimeter.  Decreased size of the right posterior hepatic lobe lesion.  Generalized heterogeneity and new focal areas of hypodensity in the liver. Cycle 30 Gemcitabine  06/06/2022, Abraxane  held due to neuropathy Cycle 31 gemcitabine  06/20/2022, Abraxane  held due to neuropathy Cycle 32 gemcitabine  07/04/2022, Abraxane  held due to neuropathy Cycle 33 gemcitabine   07/18/2022 ,Abraxane  held due to neuropathy  Cycle 34 gemcitabine  08/15/2022, Abraxane  held due to neuropathy Cycle 35 gemcitabine  08/29/2022, Abraxane  held due to neuropathy CTs 09/10/2022-enlarging pulmonary metastases.  Subtle poorly defined hypoattenuating lesions in the liver, less well-seen than on 06/04/2022.  Suspected new lesion in the dome of the right hepatic lobe Cycle 36 gemcitabine /Abraxane  09/12/2022 Cycle 37 gemcitabine /Abraxane  09/26/2022 Cycle 38 gemcitabine /Abraxane  10/10/2022 Cycle 39 gemcitabine /Abraxane  11/07/2022 Cycle 40 gemcitabine /Abraxane  11/21/2022 Cycle 41 gemcitabine /Abraxane  12/05/2022 Cycle 42 gemcitabine /Abraxane  12/19/2022 CT is 12/30/2022-new left hepatic lesion, stable bilateral pulmonary metastases Cycle 43 gemcitabine /Abraxane  01/02/2023 CT-guided biopsy of liver lesion 01/27/2023-acute/chronic inflammation, negative for malignancy, culture negative Cycle 44 gemcitabine /Abraxane  01/30/2023 Cycle 45 gemcitabine /Abraxane  02/13/2023 Cycle 46 gemcitabine /Abraxane  02/27/2023 Cycle 47 gemcitabine /Abraxane  03/13/2023 Cycle 48 gemcitabine /Abraxane  03/27/2023 CTs 04/21/2023-numerous solid and cavitary lung nodules overall similar, some slightly enlarged; multiple new and enlarging rim-enhancing liver lesions. Cycle 1 FOLFIRI with liposomal irinotecan  05/21/2023 Cycle 2 held 06/05/2023 due to neutropenia Cycle 2 FOLFIRI with liposomal irinotecan  06/18/2023, irinotecan  dose  reduced (white cell growth factor declined per patient) Cycle 3 FOLFIRI with liposomal irinotecan  07/02/2023 Cycle 4 FOLFIRI with liposomal irinotecan  07/16/2023 Cycle 5 FOLFIRI with liposomal irinotecan  07/30/2023 Cycle 6 FOLFIRI with liposomal irinotecan  08/13/2023 CTs 08/20/2023-numerous cavitary lesions seen in the lungs, extent and distribution appear similar.  A few lesions are slightly smaller.  Numerous liver lesions are now smaller.  Stable right adrenal nodule, adenoma favored. Cycle 7 FOLFIRI with liposomal irinotecan  08/27/2023 Cycle 8 FOLFIRI with liposomal irinotecan  09/11/2023 Cycle 9 FOLFIRI with liposomal irinotecan  09/24/2023 Cycle 10 FOLFIRI with liposomal irinotecan  10/08/2023 Cycle 11 FOLFIRI with liposomal irinotecan  10/22/2023 Cycle 12 FOLFIRI with liposomal irinotecan  11/05/2023 CT 11/17/2023-majority of multiple irregular thick-walled cavitary lesions throughout bilateral lungs grossly unchanged or slightly decreased in size.  Redemonstration of peripheral wedge-shaped ill-defined hypoattenuating areas in the liver suggesting treatment response.  No discrete new suspicious liver lesions seen.  No new metastatic disease. Cycle 13 FOLFIRI with liposomal irinotecan  11/19/2023 Cycle 14 FOLFIRI with liposomal irinotecan  12/03/2023 Cycle 15 FOLFIRI with liposomal irinotecan  12/24/2023 Cycle 16 FOLFIRI with liposomal irinotecan  01/14/2024 02/04/2024 treatment held due to elevated LFTs, referred for CTs CTs 02/04/2024-liver lesions less conspicuous.  No significant new lesions identified.  Innumerable scattered cavitary lung nodules bilaterally.  Most are similar but a few demonstrate reduced thickening of the wall or reduced surrounding inflammatory stranding along the margins.  No substantial new nodularity observed. 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  Apixaban  10/24/2022 Port-A-Cath malfunction 08/01/2023.  Port-A-Cath removed and replaced 08/08/2023.  Disposition: Alyssa Ross appears stable.  She saw Dr. Donato 2 weeks ago.  The plan is to resume treatment with 5-FU/liposomal irinotecan  on a 2-week schedule.  She is on a wait list for a K-ras G 12V trial.  Alyssa Ross will complete another cycle of chemotherapy today.  She will return for an office visit and chemotherapy in 2 weeks.  We will follow the CA 19-9 closely while on the current regimen.  We are waiting on results of NRG 1 testing.  We also plan to follow-up with the [facility] regarding immunotherapy trials.  Arley Hof, MD  06/30/2024  9:53 AM   "

## 2024-07-01 ENCOUNTER — Encounter: Payer: Self-pay | Admitting: *Deleted

## 2024-07-01 LAB — CANCER ANTIGEN 19-9: CA 19-9: 727 U/mL — ABNORMAL HIGH (ref 0–35)

## 2024-07-01 NOTE — Progress Notes (Signed)
 Received from [facility] notification that order has been received and they are working on obtaining a specimen dated 07/01/2024.

## 2024-07-02 ENCOUNTER — Inpatient Hospital Stay

## 2024-07-02 DIAGNOSIS — C3411 Malignant neoplasm of upper lobe, right bronchus or lung: Secondary | ICD-10-CM | POA: Diagnosis not present

## 2024-07-02 NOTE — Patient Instructions (Signed)

## 2024-07-08 ENCOUNTER — Encounter: Payer: Self-pay | Admitting: *Deleted

## 2024-07-08 NOTE — Progress Notes (Signed)
 Fax from [facility] that tissue has been received.

## 2024-07-09 ENCOUNTER — Telehealth: Payer: Self-pay | Admitting: Pharmacist

## 2024-07-09 DIAGNOSIS — E1165 Type 2 diabetes mellitus with hyperglycemia: Secondary | ICD-10-CM

## 2024-07-09 NOTE — Progress Notes (Signed)
" ° °  07/09/2024 Name: Alyssa Ross MRN: [id number] DOB: [birth year]  Chief Complaint  Patient presents with   Medication Management    Diabetes    Patient was called for follow up on diabetes management. Unfortunately, she did not answer her phone. HIPAA compliant message was left on her voicemail.  Plan: Call Patient back in 3-4 weeks as she is followed by Endocrinology.  Cassius DOROTHA Brought, PharmD, BCACP Clinical Pharmacist [phone]  "

## 2024-07-11 ENCOUNTER — Other Ambulatory Visit: Payer: Self-pay | Admitting: Oncology

## 2024-07-11 DIAGNOSIS — C250 Malignant neoplasm of head of pancreas: Secondary | ICD-10-CM

## 2024-07-11 DIAGNOSIS — I82622 Acute embolism and thrombosis of deep veins of left upper extremity: Secondary | ICD-10-CM

## 2024-07-13 ENCOUNTER — Encounter: Payer: Self-pay | Admitting: Oncology

## 2024-07-14 ENCOUNTER — Encounter: Payer: Self-pay | Admitting: Nurse Practitioner

## 2024-07-14 ENCOUNTER — Inpatient Hospital Stay

## 2024-07-14 ENCOUNTER — Inpatient Hospital Stay: Admitting: Oncology

## 2024-07-14 ENCOUNTER — Inpatient Hospital Stay: Attending: Genetic Counselor | Admitting: Nurse Practitioner

## 2024-07-14 ENCOUNTER — Encounter: Payer: Self-pay | Admitting: *Deleted

## 2024-07-14 ENCOUNTER — Inpatient Hospital Stay: Attending: Genetic Counselor

## 2024-07-14 VITALS — BP 123/88 | HR 73 | Temp 98.1°F | Resp 18 | Ht 68.0 in | Wt 182.6 lb

## 2024-07-14 VITALS — BP 133/80 | HR 76 | Resp 18

## 2024-07-14 DIAGNOSIS — C250 Malignant neoplasm of head of pancreas: Secondary | ICD-10-CM

## 2024-07-14 LAB — CMP (CANCER CENTER ONLY)
ALT: 29 U/L (ref 0–44)
AST: 25 U/L (ref 15–41)
Albumin: 3.4 g/dL — ABNORMAL LOW (ref 3.5–5.0)
Alkaline Phosphatase: 368 U/L — ABNORMAL HIGH (ref 38–126)
Anion gap: 9 (ref 5–15)
BUN: <5 mg/dL — ABNORMAL LOW (ref 6–20)
CO2: 26 mmol/L (ref 22–32)
Calcium: 8.8 mg/dL — ABNORMAL LOW (ref 8.9–10.3)
Chloride: 107 mmol/L (ref 98–111)
Creatinine: 0.79 mg/dL (ref 0.44–1.00)
GFR, Estimated: >60 mL/min
Glucose, Bld: 98 mg/dL (ref 70–99)
Potassium: 2.9 mmol/L — ABNORMAL LOW (ref 3.5–5.1)
Sodium: 143 mmol/L (ref 135–145)
Total Bilirubin: 0.5 mg/dL (ref 0.0–1.2)
Total Protein: 6.7 g/dL (ref 6.5–8.1)

## 2024-07-14 LAB — CBC WITH DIFFERENTIAL (CANCER CENTER ONLY)
Abs Immature Granulocytes: 0.01 10*3/uL (ref 0.00–0.07)
Basophils Absolute: 0 10*3/uL (ref 0.0–0.1)
Basophils Relative: 1 %
Eosinophils Absolute: 0.2 10*3/uL (ref 0.0–0.5)
Eosinophils Relative: 3 %
HCT: 33.8 % — ABNORMAL LOW (ref 36.0–46.0)
Hemoglobin: 11.2 g/dL — ABNORMAL LOW (ref 12.0–15.0)
Immature Granulocytes: 0 %
Lymphocytes Relative: 29 %
Lymphs Abs: 1.4 10*3/uL (ref 0.7–4.0)
MCH: 32.3 pg (ref 26.0–34.0)
MCHC: 33.1 g/dL (ref 30.0–36.0)
MCV: 97.4 fL (ref 80.0–100.0)
Monocytes Absolute: 0.3 10*3/uL (ref 0.1–1.0)
Monocytes Relative: 7 %
Neutro Abs: 2.8 10*3/uL (ref 1.7–7.7)
Neutrophils Relative %: 60 %
Platelet Count: 323 10*3/uL (ref 150–400)
RBC: 3.47 MIL/uL — ABNORMAL LOW (ref 3.87–5.11)
RDW: 13.4 % (ref 11.5–15.5)
WBC Count: 4.7 10*3/uL (ref 4.0–10.5)
nRBC: 0 % (ref 0.0–0.2)

## 2024-07-14 LAB — MAGNESIUM: Magnesium: 1.6 mg/dL — ABNORMAL LOW (ref 1.7–2.4)

## 2024-07-14 MED ORDER — SODIUM CHLORIDE 0.9 % IV SOLN
2400.0000 mg/m2 | INTRAVENOUS | Status: DC
  Administered 2024-07-14: 5000 mg via INTRAVENOUS
  Filled 2024-07-14: qty 100

## 2024-07-14 MED ORDER — PALONOSETRON HCL INJECTION 0.25 MG/5ML
0.2500 mg | Freq: Once | INTRAVENOUS | Status: AC
  Administered 2024-07-14: 0.25 mg via INTRAVENOUS
  Filled 2024-07-14: qty 5

## 2024-07-14 MED ORDER — ATROPINE SULFATE 1 MG/ML IV SOLN
0.5000 mg | Freq: Once | INTRAVENOUS | Status: AC | PRN
  Administered 2024-07-14: 0.5 mg via INTRAVENOUS
  Filled 2024-07-14: qty 1

## 2024-07-14 MED ORDER — DEXAMETHASONE SODIUM PHOSPHATE 10 MG/ML IJ SOLN
10.0000 mg | Freq: Once | INTRAMUSCULAR | Status: AC
  Administered 2024-07-14: 10 mg via INTRAVENOUS
  Filled 2024-07-14: qty 1

## 2024-07-14 MED ORDER — SODIUM CHLORIDE 0.9 % IV SOLN: INTRAVENOUS | Status: DC

## 2024-07-14 MED ORDER — SODIUM CHLORIDE 0.9 % IV SOLN
55.0000 mg/m2 | Freq: Once | INTRAVENOUS | Status: AC
  Administered 2024-07-14: 107.5 mg via INTRAVENOUS
  Filled 2024-07-14: qty 25

## 2024-07-14 MED ORDER — SODIUM CHLORIDE 0.9 % IV SOLN
400.0000 mg/m2 | Freq: Once | INTRAVENOUS | Status: AC
  Administered 2024-07-14: 776 mg via INTRAVENOUS
  Filled 2024-07-14: qty 38.8

## 2024-07-14 NOTE — Progress Notes (Signed)
 Patient seen by Olam Ned NP today  Vitals are within treatment parameters:Yes   Labs are within treatment parameters: Yes   Treatment plan has been signed: Yes   Per physician team, Patient is ready for treatment and there are NO modifications to the treatment plan.

## 2024-07-14 NOTE — Progress Notes (Signed)
 PATIENT NAVIGATOR PROGRESS NOTE  Name: Alyssa Ross Date: 07/14/2024 MRN: [id number]  DOB: 11/12/1965   Reason for visit:  [facility] requested for RNA   Comments:  due to insufficient tissue testing could not be completed with [facility]    Time spent counseling/coordinating care: 15-30 minutes

## 2024-07-14 NOTE — Progress Notes (Signed)
 " [facility] OFFICE PROGRESS NOTE   Diagnosis:  Pancreas cancer  INTERVAL HISTORY:   Ms. Towell returns as scheduled.  She completed a cycle of FOLFIRI with liposomal irinotecan  06/30/2024.  She had a few episodes of nausea, resolved with oral intake.  No mouth sores.  No diarrhea.  No hand or foot pain or redness.  She denies abdominal pain.  She reports balance issues.  She attributes this to neuropathy.  No unusual headaches.  No diplopia.  Objective:  Vital signs in last 24 hours:  Blood pressure 123/88, pulse 73, temperature 98.1 F (36.7 C), temperature source Temporal, resp. rate 18, height 5' 8 (1.727 m), weight 182 lb 9.6 oz (82.8 kg), last menstrual period 09/17/2012, SpO2 100%.    HEENT: No thrush or ulcers. Resp: Lungs clear bilaterally. Cardio: Regular rate and rhythm. GI: No hepatosplenomegaly.  Nontender. Vascular: No leg edema. Neuro: Alert and oriented. Skin: Palms with hyperpigmentation, no erythema. Port-A-Cath without erythema.  Lab Results:  Lab Results  Component Value Date   WBC 4.7 07/14/2024   HGB 11.2 (L) 07/14/2024   HCT 33.8 (L) 07/14/2024   MCV 97.4 07/14/2024   PLT 323 07/14/2024   NEUTROABS 2.8 07/14/2024    Imaging:  No results found.  Medications: I have reviewed the patient's current medications.  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 Tumor invades the duodenal wall and vascular groove, resection margins negative, 4/34 lymph nodes positive MSI-stable, tumor showed instability in 2 loci as did adjacent normal tissue Foundation 1-K-ras G12 V, microsatellite status could not be determined, tumor mutation burden 0 EUS FNA biopsy of pancreas mass on 07/03/2018-well-differentiated neuroendocrine tumor CTs 01/29/2019-ill-defined pancreas head mass, 5 pulmonary nodules-1 with a small amount of central cavitation, tumor abuts the left margin of the portal vein  indistinct density surrounding, hepatic artery, complex cystic lesion of the right kidney, right adrenal mass-characterized as an adenoma on a [facility] MRI 12/21/2018 [facility]  03/03/2019-no focal pancreas activity, no tracer accumulation within the suspicious pulmonary nodules, left uterine mass with tracer accumulation felt to represent a leiomyoma Elevated preoperative CA 19-9--CA 19-9 186 on 01/18/2019 CT chest 04/16/2019-multiple bilateral pulmonary nodules, some with increased cavitation, stable in size Cycle 1 FOLFIRINOX 04/27/2019 Cycle 2 FOLFIRINOX 05/11/2019 Cycle 3 FOLFIRINOX 05/23/2019 Cycle 4 FOLFIRINOX 06/08/2019 Cycle 5 FOLFIRINOX 06/22/2019 CT chest 07/02/2019-stable size of bilateral pulmonary nodules.  Dominant cavitary lesions in both lungs show increased cavitation with thinner walls.  Stable 2.1 cm right adrenal nodule. Cycle 6 FOLFIRINOX 07/06/2019 Cycle 7 FOLFIRINOX 07/21/2019 Cycle 8 FOLFIRINOX 08/03/2019, oxaliplatin  deleted secondary to neuropathy CT chest 08/24/2019-decreased size of several lung nodules with resolution of a left upper lobe nodule, no new nodules Radiation to the pancreas surgical area with concurrent Xeloda  09/13/2019-10/20/2019  CTs 11/29/2019-multiple small pulmonary nodules scattered throughout the lungs bilaterally, appear increased in number and size. No definite evidence of metastatic disease in the abdomen or pelvis. Markedly enlarged and heterogeneous appearing uterus, likely to represent multifocal fibroids. 1 of these lesions appears to be an exophytic subserosal fibroid in the posterior lateral aspect of the uterine body on the left side although this comes in close proximity to the left adnexa such that a primary ovarian lesion is difficult to completely exclude. CTs 02/07/2020-slight enlargement of bilateral lung nodules, some are cavitary, no evidence of metastatic disease in the abdomen or pelvis, stable right adrenal nodule, uterine fibroids CTs  04/26/2020-mild enlargement of pulmonary nodules, slight increase in trace pelvic fluid, new  soft tissue thickening inferior to the cecal tip suspicious for peritoneal metastasis CT 05/26/2020-improved appearance of soft tissue at the inferior tip of the cecum, mildly thickened short appendix-findings suggestive of resolving appendicitis, stable small bibasilar pulmonary nodules, fibroids Plan biopsy of right cecal tip soft tissue canceled secondary to radiologic improvement CT chest 08/02/2020-enlargement and progressive cavitation of multiple bilateral lung nodules.  Some new nodules are present. CTs 10/24/2020- increase in size of pulmonary nodules, no new nodules, no evidence of metastatic disease in the abdomen, stable right adrenal nodule CT 01/09/2021-slight interval enlargement of pulmonary nodules, stable right adrenal nodule Navigation bronchoscopy 01/30/2021-left lower lobe cavitary nodule FNA-adenocarcinoma, brushing-adenocarcinoma.  Left lower lobe lavage-adenocarcinoma.  Right upper lobe brushing and FNA biopsy of cavitary nodule-adenocarcinoma-immunohistochemical profile consistent with pancreas adenocarcinoma Cycle 1 gemcitabine /Abraxane  03/28/2021 Cycle 2 gemcitabine /Abraxane  04/11/2021 Cycle 3 gemcitabine /Abraxane  04/25/2021 Cycle 4 gemcitabine /Abraxane  05/09/2021 Cycle 5 gemcitabine /Abraxane  05/23/2021 CT chest 06/05/2021-interval cavitation of multiple pulmonary nodules, some nodules have decreased in size, no new or enlarging nodules Cycle 6 gemcitabine /Abraxane  06/06/2021 Cycle 7 gemcitabine /Abraxane  06/21/2021 Cycle 8 gemcitabine /Abraxane  07/05/2021 Cycle 9 Gemcitabine /Abraxane  07/19/2021 Cycle 10 gemcitabine  08/01/2021-Abraxane  held secondary to neuropathy CT chest 08/13/2021-mild decrease in size and wall thickness of multiple cavitary nodules, no new or progressive disease in the chest, indeterminate low-attenuation right liver lesions Cycle 11 gemcitabine  08/16/2021-Abraxane  held  secondary to neuropathy Cycle 12 gemcitabine  08/30/2021-Abraxane  held secondary to neuropathy Cycle 13 gemcitabine  09/13/2021-Abraxane  held secondary to neuropathy Cycle 14 gemcitabine  09/27/2021-Abraxane  held secondary to neuropathy Cycle 15 gemcitabine  10/11/2021-Abraxane  held secondary to neuropathy CTs 10/22/2021-no change in multiple cavitary lung nodules, no evidence of disease progression, ill-defined hypodense lesion in the posterior right liver suspicious for metastatic disease Cycle 16 gemcitabine  10/25/2021 Cycle 17 gemcitabine  11/08/2021 Cycle 18 Gemcitabine  11/22/2021 Cycle 19 gemcitabine  12/06/2021 Cycle 20 Gemcitabine  12/20/2021 CT 12/31/2021-mild increase in size of bilateral pulmonary metastases, stable subtle continuation right liver lesions Cycle 20 gemcitabine /Abraxane  01/03/2022 Cycle 21 gemcitabine /Abraxane  01/17/2022 Cycle 22 gemcitabine /Abraxane  01/31/2022 Cycle 23 gemcitabine /Abraxane  02/14/2022 Cycle 24 gemcitabine /Abraxane  02/28/2022 CTs 03/11/2022-widespread metastatic disease to the lungs again noted with slight involution of several of the pulmonary nodules, no definite new nodules noted; interval cavitation of solid lesion in the posterior aspect right lobe of the liver, no new liver lesions noted. Cycle 25 Gemcitabine /Abraxane  03/14/2022 Cycle 26 gemcitabine /Abraxane  03/28/2022 Cycle 27 gemcitabine /Abraxane  04/25/2022 Cycle 28 gemcitabine /Abraxane  05/09/2022 Cycle 29 Gemcitabine  05/23/2022, Abraxane  held due to neuropathy CTs 06/04/2022-stable multifocal cavitary pulmonary nodules.  No definite new nodules.  No new nodules greater than a centimeter.  Decreased size of the right posterior hepatic lobe lesion.  Generalized heterogeneity and new focal areas of hypodensity in the liver. Cycle 30 Gemcitabine  06/06/2022, Abraxane  held due to neuropathy Cycle 31 gemcitabine  06/20/2022, Abraxane  held due to neuropathy Cycle 32 gemcitabine  07/04/2022, Abraxane  held due to neuropathy Cycle  33 gemcitabine   07/18/2022 ,Abraxane  held due to neuropathy Cycle 34 gemcitabine  08/15/2022, Abraxane  held due to neuropathy Cycle 35 gemcitabine  08/29/2022, Abraxane  held due to neuropathy CTs 09/10/2022-enlarging pulmonary metastases.  Subtle poorly defined hypoattenuating lesions in the liver, less well-seen than on 06/04/2022.  Suspected new lesion in the dome of the right hepatic lobe Cycle 36 gemcitabine /Abraxane  09/12/2022 Cycle 37 gemcitabine /Abraxane  09/26/2022 Cycle 38 gemcitabine /Abraxane  10/10/2022 Cycle 39 gemcitabine /Abraxane  11/07/2022 Cycle 40 gemcitabine /Abraxane  11/21/2022 Cycle 41 gemcitabine /Abraxane  12/05/2022 Cycle 42 gemcitabine /Abraxane  12/19/2022 CT is 12/30/2022-new left hepatic lesion, stable bilateral pulmonary metastases Cycle 43 gemcitabine /Abraxane  01/02/2023 CT-guided biopsy of liver lesion 01/27/2023-acute/chronic inflammation, negative for malignancy, culture negative Cycle 44 gemcitabine /Abraxane  01/30/2023 Cycle 45 gemcitabine /Abraxane  02/13/2023 Cycle  46 gemcitabine /Abraxane  02/27/2023 Cycle 47 gemcitabine /Abraxane  03/13/2023 Cycle 48 gemcitabine /Abraxane  03/27/2023 CTs 04/21/2023-numerous solid and cavitary lung nodules overall similar, some slightly enlarged; multiple new and enlarging rim-enhancing liver lesions. Cycle 1 FOLFIRI with liposomal irinotecan  05/21/2023 Cycle 2 held 06/05/2023 due to neutropenia Cycle 2 FOLFIRI with liposomal irinotecan  06/18/2023, irinotecan  dose reduced (white cell growth factor declined per patient) Cycle 3 FOLFIRI with liposomal irinotecan  07/02/2023 Cycle 4 FOLFIRI with liposomal irinotecan  07/16/2023 Cycle 5 FOLFIRI with liposomal irinotecan  07/30/2023 Cycle 6 FOLFIRI with liposomal irinotecan  08/13/2023 CTs 08/20/2023-numerous cavitary lesions seen in the lungs, extent and distribution appear similar.  A few lesions are slightly smaller.  Numerous liver lesions are now smaller.  Stable right adrenal nodule, adenoma favored. Cycle 7 FOLFIRI with  liposomal irinotecan  08/27/2023 Cycle 8 FOLFIRI with liposomal irinotecan  09/11/2023 Cycle 9 FOLFIRI with liposomal irinotecan  09/24/2023 Cycle 10 FOLFIRI with liposomal irinotecan  10/08/2023 Cycle 11 FOLFIRI with liposomal irinotecan  10/22/2023 Cycle 12 FOLFIRI with liposomal irinotecan  11/05/2023 CT 11/17/2023-majority of multiple irregular thick-walled cavitary lesions throughout bilateral lungs grossly unchanged or slightly decreased in size.  Redemonstration of peripheral wedge-shaped ill-defined hypoattenuating areas in the liver suggesting treatment response.  No discrete new suspicious liver lesions seen.  No new metastatic disease. Cycle 13 FOLFIRI with liposomal irinotecan  11/19/2023 Cycle 14 FOLFIRI with liposomal irinotecan  12/03/2023 Cycle 15 FOLFIRI with liposomal irinotecan  12/24/2023 Cycle 16 FOLFIRI with liposomal irinotecan  01/14/2024 02/04/2024 treatment held due to elevated LFTs, referred for CTs CTs 02/04/2024-liver lesions less conspicuous.  No significant new lesions identified.  Innumerable scattered cavitary lung nodules bilaterally.  Most are similar but a few demonstrate reduced thickening of the wall or reduced surrounding inflammatory stranding along the margins.  No substantial new nodularity observed. 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  Apixaban  10/24/2022 Port-A-Cath malfunction 08/01/2023.  Port-A-Cath removed and replaced 08/08/2023.    Disposition: [name] appears stable.  Treatment was resumed with FOLFIRI with liposomal irinotecan  06/30/2024.  She tolerated well.  Plan to proceed with the next cycle today.  CBC and chemistry panel reviewed.  Labs are adequate for treatment.  She has hypokalemia.  Confirms she has not been taking oral potassium as prescribed.  She plans to resume.  We will check a magnesium  level.  RNA sequencing from the Whipple resection specimen failed.  We will look for another specimen to send for testing, question lung nodule biopsies from 01/30/2021.  She will return for follow-up and the next cycle of chemotherapy  in 2 weeks.  We are available to see her sooner if needed.    Olam Ned ANP/GNP-BC   07/14/2024  8:53 AM        "

## 2024-07-15 LAB — CANCER ANTIGEN 19-9: CA 19-9: 790 U/mL — ABNORMAL HIGH (ref 0–35)

## 2024-07-16 ENCOUNTER — Inpatient Hospital Stay

## 2024-07-16 VITALS — BP 138/80 | HR 71 | Temp 98.5°F | Resp 18

## 2024-07-16 DIAGNOSIS — C250 Malignant neoplasm of head of pancreas: Secondary | ICD-10-CM

## 2024-07-16 NOTE — Patient Instructions (Addendum)
 Getting a Device With a Small Disc Placed for Long-Term IV Use (Implanted Port Insertion): What to Know After After having a device called a port placed for long-term IV use (implanted port insertion), it's common to have some discomfort at the site where the port was put in. You may also see some bruising on your skin. This should get better over 3-4 days. Follow these instructions at home: Caring for your port Your health care provider will give you a card with information from your port's manufacturer. Keep this with you at all times. Take care of the port as told. Ask your provider if you or a family member can be trained in how to take care of the port at home. You may also be able to get help from a home health care nurse. Write down what type of port you have. Caring for your port site  Take care of your port site as told. Make sure you: Wash your hands with soap and water for at least 20 seconds before and after you change your bandage. If you can't use soap and water, use hand sanitizer. Change your bandage. Leave stitches or skin glue alone. Leave tape strips alone unless you're told to take them off. You may trim the edges of the tape strips if they curl up. Check the area around your port site every day for signs of infection. Check for: Redness, swelling, or pain. Fluid or blood. Warmth. Pus or a bad smell. Activity Ask if it's OK for you to lift. Ask what things are safe for you to do at home. Ask when you can go back to work or school. General instructions Take your medicines only as told. If you were given a sedative, do not drive or use machines until you're told it's safe. A sedative can make you sleepy. Do not take baths, swim, or use a hot tub until you're told it's OK. Ask if you can shower. Wear an alert bracelet in case of an emergency. This will tell providers that you have a port. Contact a health care provider if: You can't flush your port or draw blood from  your port as told. You have a fever or chills. You have any signs of infection. Get help right away if: You have chest pain. You feel short of breath. You have bleeding from your port that you can't control. These symptoms may be an emergency. Call 911 right away. Do not wait to see if the symptoms will go away. Do not drive yourself to the hospital. This information is not intended to replace advice given to you by your health care provider. Make sure you discuss any questions you have with your health care provider. Document Revised: 03/05/2024 Document Reviewed: 03/05/2024 Elsevier Patient Education  2025 [employer].

## 2024-07-28 ENCOUNTER — Inpatient Hospital Stay

## 2024-07-28 ENCOUNTER — Inpatient Hospital Stay: Admitting: Nurse Practitioner

## 2024-07-28 ENCOUNTER — Inpatient Hospital Stay: Admitting: Oncology

## 2024-07-30 ENCOUNTER — Inpatient Hospital Stay

## 2024-08-09 ENCOUNTER — Inpatient Hospital Stay: Attending: Genetic Counselor

## 2024-08-11 ENCOUNTER — Inpatient Hospital Stay

## 2024-08-11 ENCOUNTER — Inpatient Hospital Stay: Admitting: Nurse Practitioner

## 2024-08-13 ENCOUNTER — Inpatient Hospital Stay

## 2024-08-16 ENCOUNTER — Inpatient Hospital Stay

## 2024-08-24 ENCOUNTER — Ambulatory Visit: Admitting: Dermatology

## 2024-12-27 ENCOUNTER — Encounter: Payer: Self-pay | Admitting: Internal Medicine
